# Patient Record
Sex: Male | Born: 1965 | Race: White | Hispanic: Yes | Marital: Single | Smoking: Never smoker
Health system: Southern US, Community
[De-identification: ages and names within clinical notes are randomized; demographics above are authoritative.]

## PROBLEM LIST (undated history)

## (undated) DIAGNOSIS — G40409 Other generalized epilepsy and epileptic syndromes, not intractable, without status epilepticus: Secondary | ICD-10-CM

## (undated) DIAGNOSIS — J189 Pneumonia, unspecified organism: Secondary | ICD-10-CM

## (undated) DIAGNOSIS — D649 Anemia, unspecified: Secondary | ICD-10-CM

## (undated) DIAGNOSIS — E039 Hypothyroidism, unspecified: Secondary | ICD-10-CM

## (undated) DIAGNOSIS — F419 Anxiety disorder, unspecified: Secondary | ICD-10-CM

## (undated) DIAGNOSIS — N186 End stage renal disease: Secondary | ICD-10-CM

## (undated) DIAGNOSIS — K5900 Constipation, unspecified: Secondary | ICD-10-CM

## (undated) DIAGNOSIS — K219 Gastro-esophageal reflux disease without esophagitis: Secondary | ICD-10-CM

## (undated) DIAGNOSIS — Z992 Dependence on renal dialysis: Secondary | ICD-10-CM

## (undated) DIAGNOSIS — Q059 Spina bifida, unspecified: Secondary | ICD-10-CM

## (undated) DIAGNOSIS — R0602 Shortness of breath: Secondary | ICD-10-CM

## (undated) DIAGNOSIS — I10 Essential (primary) hypertension: Secondary | ICD-10-CM

## (undated) DIAGNOSIS — I350 Nonrheumatic aortic (valve) stenosis: Secondary | ICD-10-CM

## (undated) DIAGNOSIS — Z9289 Personal history of other medical treatment: Secondary | ICD-10-CM

## (undated) DIAGNOSIS — T8189XA Other complications of procedures, not elsewhere classified, initial encounter: Secondary | ICD-10-CM

## (undated) DIAGNOSIS — G43909 Migraine, unspecified, not intractable, without status migrainosus: Secondary | ICD-10-CM

## (undated) DIAGNOSIS — G4700 Insomnia, unspecified: Secondary | ICD-10-CM

## (undated) DIAGNOSIS — R011 Cardiac murmur, unspecified: Secondary | ICD-10-CM

## (undated) DIAGNOSIS — Z87442 Personal history of urinary calculi: Secondary | ICD-10-CM

## (undated) HISTORY — PX: LITHOTRIPSY: SUR834

## (undated) HISTORY — PX: TONGUE SURGERY: SHX810

## (undated) HISTORY — PX: INSERTION OF DIALYSIS CATHETER: SHX1324

## (undated) HISTORY — PX: PATELLAR TENDON REPAIR: SHX737

## (undated) HISTORY — PX: APPENDECTOMY: SHX54

## (undated) HISTORY — PX: ILEOSTOMY: SHX1783

## (undated) HISTORY — PX: COLONOSCOPY: SHX174

---
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
## 2007-10-01 DIAGNOSIS — N189 Chronic kidney disease, unspecified: Secondary | ICD-10-CM | POA: Insufficient documentation

## 2007-10-01 DIAGNOSIS — I10 Essential (primary) hypertension: Secondary | ICD-10-CM | POA: Insufficient documentation

## 2007-10-01 DIAGNOSIS — D509 Iron deficiency anemia, unspecified: Secondary | ICD-10-CM | POA: Insufficient documentation

## 2007-10-01 DIAGNOSIS — D631 Anemia in chronic kidney disease: Secondary | ICD-10-CM | POA: Insufficient documentation

## 2007-10-01 DIAGNOSIS — N2581 Secondary hyperparathyroidism of renal origin: Secondary | ICD-10-CM | POA: Insufficient documentation

## 2007-10-02 DIAGNOSIS — Z888 Allergy status to other drugs, medicaments and biological substances status: Secondary | ICD-10-CM | POA: Insufficient documentation

## 2008-03-24 ENCOUNTER — Ambulatory Visit (HOSPITAL_COMMUNITY): Admission: RE | Admit: 2008-03-24 | Discharge: 2008-03-24 | Payer: Self-pay | Admitting: Nephrology

## 2008-04-26 ENCOUNTER — Emergency Department (HOSPITAL_COMMUNITY): Admission: EM | Admit: 2008-04-26 | Discharge: 2008-04-26 | Payer: Self-pay | Admitting: Emergency Medicine

## 2008-05-04 ENCOUNTER — Inpatient Hospital Stay (HOSPITAL_COMMUNITY): Admission: EM | Admit: 2008-05-04 | Discharge: 2008-05-06 | Payer: Self-pay | Admitting: Emergency Medicine

## 2008-05-05 ENCOUNTER — Encounter (INDEPENDENT_AMBULATORY_CARE_PROVIDER_SITE_OTHER): Payer: Self-pay | Admitting: Cardiology

## 2008-05-22 ENCOUNTER — Ambulatory Visit: Payer: Self-pay | Admitting: Internal Medicine

## 2008-05-22 ENCOUNTER — Inpatient Hospital Stay (HOSPITAL_COMMUNITY): Admission: EM | Admit: 2008-05-22 | Discharge: 2008-05-23 | Payer: Self-pay | Admitting: Emergency Medicine

## 2008-08-23 ENCOUNTER — Inpatient Hospital Stay (HOSPITAL_COMMUNITY): Admission: EM | Admit: 2008-08-23 | Discharge: 2008-08-25 | Payer: Self-pay | Admitting: Emergency Medicine

## 2008-08-23 ENCOUNTER — Ambulatory Visit: Payer: Self-pay | Admitting: Vascular Surgery

## 2008-09-11 ENCOUNTER — Ambulatory Visit: Payer: Self-pay | Admitting: Vascular Surgery

## 2008-09-30 ENCOUNTER — Ambulatory Visit: Payer: Self-pay | Admitting: Vascular Surgery

## 2008-10-08 ENCOUNTER — Ambulatory Visit: Payer: Self-pay | Admitting: Thoracic Diseases

## 2008-10-08 ENCOUNTER — Ambulatory Visit (HOSPITAL_COMMUNITY): Admission: RE | Admit: 2008-10-08 | Discharge: 2008-10-08 | Payer: Self-pay | Admitting: Vascular Surgery

## 2008-11-17 DIAGNOSIS — Z8614 Personal history of Methicillin resistant Staphylococcus aureus infection: Secondary | ICD-10-CM | POA: Insufficient documentation

## 2009-01-28 ENCOUNTER — Ambulatory Visit (HOSPITAL_COMMUNITY): Admission: RE | Admit: 2009-01-28 | Discharge: 2009-01-28 | Payer: Self-pay | Admitting: Nephrology

## 2009-08-25 ENCOUNTER — Emergency Department (HOSPITAL_COMMUNITY): Admission: EM | Admit: 2009-08-25 | Discharge: 2009-08-25 | Payer: Self-pay | Admitting: Emergency Medicine

## 2009-08-26 ENCOUNTER — Ambulatory Visit (HOSPITAL_COMMUNITY): Admission: RE | Admit: 2009-08-26 | Discharge: 2009-08-26 | Payer: Self-pay | Admitting: Nephrology

## 2009-09-22 ENCOUNTER — Ambulatory Visit (HOSPITAL_COMMUNITY): Admission: RE | Admit: 2009-09-22 | Discharge: 2009-09-22 | Payer: Self-pay | Admitting: Nephrology

## 2010-01-23 ENCOUNTER — Encounter: Payer: Self-pay | Admitting: Nephrology

## 2010-01-24 ENCOUNTER — Encounter: Payer: Self-pay | Admitting: Nephrology

## 2010-03-17 LAB — POTASSIUM: Potassium: 5.9 mEq/L — ABNORMAL HIGH (ref 3.5–5.1)

## 2010-03-18 LAB — POCT I-STAT, CHEM 8
BUN: 41 mg/dL — ABNORMAL HIGH (ref 6–23)
Calcium, Ion: 1.13 mmol/L (ref 1.12–1.32)
Chloride: 99 mEq/L (ref 96–112)
Creatinine, Ser: 9.2 mg/dL — ABNORMAL HIGH (ref 0.4–1.5)
Glucose, Bld: 83 mg/dL (ref 70–99)
HCT: 35 % — ABNORMAL LOW (ref 39.0–52.0)
Hemoglobin: 11.9 g/dL — ABNORMAL LOW (ref 13.0–17.0)
Potassium: 5 mEq/L (ref 3.5–5.1)
Sodium: 137 mEq/L (ref 135–145)
TCO2: 33 mmol/L (ref 0–100)

## 2010-03-18 LAB — DIFFERENTIAL
Basophils Absolute: 0 10*3/uL (ref 0.0–0.1)
Basophils Relative: 0 % (ref 0–1)
Eosinophils Absolute: 0.1 10*3/uL (ref 0.0–0.7)
Eosinophils Relative: 2 % (ref 0–5)
Lymphocytes Relative: 10 % — ABNORMAL LOW (ref 12–46)
Lymphs Abs: 0.6 10*3/uL — ABNORMAL LOW (ref 0.7–4.0)
Monocytes Absolute: 0.5 10*3/uL (ref 0.1–1.0)
Monocytes Relative: 9 % (ref 3–12)
Neutro Abs: 4.2 10*3/uL (ref 1.7–7.7)
Neutrophils Relative %: 79 % — ABNORMAL HIGH (ref 43–77)

## 2010-03-18 LAB — CBC
HCT: 32.7 % — ABNORMAL LOW (ref 39.0–52.0)
Hemoglobin: 10.3 g/dL — ABNORMAL LOW (ref 13.0–17.0)
MCH: 29 pg (ref 26.0–34.0)
MCHC: 31.5 g/dL (ref 30.0–36.0)
MCV: 92.1 fL (ref 78.0–100.0)
Platelets: 139 10*3/uL — ABNORMAL LOW (ref 150–400)
RBC: 3.55 MIL/uL — ABNORMAL LOW (ref 4.22–5.81)
RDW: 14.9 % (ref 11.5–15.5)
WBC: 5.4 10*3/uL (ref 4.0–10.5)

## 2010-03-18 LAB — POTASSIUM: Potassium: 4.9 mEq/L (ref 3.5–5.1)

## 2010-04-09 LAB — CBC
HCT: 33.4 % — ABNORMAL LOW (ref 39.0–52.0)
HCT: 37.1 % — ABNORMAL LOW (ref 39.0–52.0)
HCT: 37.8 % — ABNORMAL LOW (ref 39.0–52.0)
Hemoglobin: 11.4 g/dL — ABNORMAL LOW (ref 13.0–17.0)
Hemoglobin: 12.7 g/dL — ABNORMAL LOW (ref 13.0–17.0)
Hemoglobin: 12.8 g/dL — ABNORMAL LOW (ref 13.0–17.0)
MCHC: 33.7 g/dL (ref 30.0–36.0)
MCHC: 34.2 g/dL (ref 30.0–36.0)
MCHC: 34.5 g/dL (ref 30.0–36.0)
MCV: 92.5 fL (ref 78.0–100.0)
MCV: 93 fL (ref 78.0–100.0)
MCV: 93.2 fL (ref 78.0–100.0)
Platelets: 140 10*3/uL — ABNORMAL LOW (ref 150–400)
Platelets: 160 10*3/uL (ref 150–400)
Platelets: DECREASED 10*3/uL (ref 150–400)
RBC: 3.59 MIL/uL — ABNORMAL LOW (ref 4.22–5.81)
RBC: 4.01 MIL/uL — ABNORMAL LOW (ref 4.22–5.81)
RBC: 4.06 MIL/uL — ABNORMAL LOW (ref 4.22–5.81)
RDW: 17.8 % — ABNORMAL HIGH (ref 11.5–15.5)
RDW: 17.8 % — ABNORMAL HIGH (ref 11.5–15.5)
RDW: 18.3 % — ABNORMAL HIGH (ref 11.5–15.5)
WBC: 8.1 10*3/uL (ref 4.0–10.5)
WBC: 9.1 10*3/uL (ref 4.0–10.5)
WBC: 9.6 10*3/uL (ref 4.0–10.5)

## 2010-04-09 LAB — BASIC METABOLIC PANEL
BUN: 47 mg/dL — ABNORMAL HIGH (ref 6–23)
CO2: 28 mEq/L (ref 19–32)
Calcium: 8.2 mg/dL — ABNORMAL LOW (ref 8.4–10.5)
Chloride: 95 mEq/L — ABNORMAL LOW (ref 96–112)
Creatinine, Ser: 9.22 mg/dL — ABNORMAL HIGH (ref 0.4–1.5)
GFR calc Af Amer: 8 mL/min — ABNORMAL LOW (ref >60)
GFR calc non Af Amer: 6 mL/min — ABNORMAL LOW (ref >60)
Glucose, Bld: 135 mg/dL — ABNORMAL HIGH (ref 70–99)
Potassium: 4.5 mEq/L (ref 3.5–5.1)
Sodium: 136 mEq/L (ref 135–145)

## 2010-04-09 LAB — POCT CARDIAC MARKERS
CKMB, poc: <1 ng/mL — ABNORMAL LOW (ref 1.0–8.0)
Myoglobin, poc: 381 ng/mL (ref 12–200)
Troponin i, poc: <0.05 ng/mL (ref 0.00–0.09)

## 2010-04-09 LAB — COMPREHENSIVE METABOLIC PANEL
ALT: 13 U/L (ref 0–53)
AST: 10 U/L (ref 0–37)
Albumin: 3.5 g/dL (ref 3.5–5.2)
Alkaline Phosphatase: 77 U/L (ref 39–117)
BUN: 55 mg/dL — ABNORMAL HIGH (ref 6–23)
CO2: 25 mEq/L (ref 19–32)
Calcium: 8.5 mg/dL (ref 8.4–10.5)
Chloride: 95 mEq/L — ABNORMAL LOW (ref 96–112)
Creatinine, Ser: 9.44 mg/dL — ABNORMAL HIGH (ref 0.4–1.5)
GFR calc Af Amer: 7 mL/min — ABNORMAL LOW (ref >60)
GFR calc non Af Amer: 6 mL/min — ABNORMAL LOW (ref >60)
Glucose, Bld: 112 mg/dL — ABNORMAL HIGH (ref 70–99)
Potassium: 4.5 mEq/L (ref 3.5–5.1)
Sodium: 136 mEq/L (ref 135–145)
Total Bilirubin: 0.7 mg/dL (ref 0.3–1.2)
Total Protein: 9.2 g/dL — ABNORMAL HIGH (ref 6.0–8.3)

## 2010-04-09 LAB — POCT I-STAT 4, (NA,K, GLUC, HGB,HCT)
Glucose, Bld: 90 mg/dL (ref 70–99)
HCT: 45 % (ref 39.0–52.0)
Hemoglobin: 15.3 g/dL (ref 13.0–17.0)
Potassium: 4.8 mEq/L (ref 3.5–5.1)
Sodium: 136 mEq/L (ref 135–145)

## 2010-04-09 LAB — DIFFERENTIAL
Basophils Absolute: 0 10*3/uL (ref 0.0–0.1)
Basophils Relative: 0 % (ref 0–1)
Eosinophils Absolute: 0.2 10*3/uL (ref 0.0–0.7)
Eosinophils Relative: 2 % (ref 0–5)
Lymphocytes Relative: 7 % — ABNORMAL LOW (ref 12–46)
Lymphs Abs: 0.6 10*3/uL — ABNORMAL LOW (ref 0.7–4.0)
Monocytes Absolute: 0.6 10*3/uL (ref 0.1–1.0)
Monocytes Relative: 7 % (ref 3–12)
Neutro Abs: 7.7 10*3/uL (ref 1.7–7.7)
Neutrophils Relative %: 85 % — ABNORMAL HIGH (ref 43–77)

## 2010-04-09 LAB — POTASSIUM: Potassium: 4.3 mEq/L (ref 3.5–5.1)

## 2010-04-09 LAB — PROTIME-INR
INR: 1.1 (ref 0.00–1.49)
Prothrombin Time: 13.9 seconds (ref 11.6–15.2)

## 2010-04-12 LAB — CBC
HCT: 36.3 % — ABNORMAL LOW (ref 39.0–52.0)
HCT: 38.1 % — ABNORMAL LOW (ref 39.0–52.0)
HCT: 38.6 % — ABNORMAL LOW (ref 39.0–52.0)
Hemoglobin: 12.3 g/dL — ABNORMAL LOW (ref 13.0–17.0)
Hemoglobin: 12.8 g/dL — ABNORMAL LOW (ref 13.0–17.0)
Hemoglobin: 12.9 g/dL — ABNORMAL LOW (ref 13.0–17.0)
MCHC: 33.9 g/dL (ref 30.0–36.0)
MCHC: 33.5 g/dL (ref 30.0–36.0)
MCHC: 33.5 g/dL (ref 30.0–36.0)
MCV: 93.2 fL (ref 78.0–100.0)
MCV: 93.2 fL (ref 78.0–100.0)
MCV: 93.9 fL (ref 78.0–100.0)
Platelets: 100 10*3/uL — ABNORMAL LOW (ref 150–400)
Platelets: 106 10*3/uL — ABNORMAL LOW (ref 150–400)
Platelets: 112 10*3/uL — ABNORMAL LOW (ref 150–400)
RBC: 3.89 MIL/uL — ABNORMAL LOW (ref 4.22–5.81)
RBC: 4.05 MIL/uL — ABNORMAL LOW (ref 4.22–5.81)
RBC: 4.14 MIL/uL — ABNORMAL LOW (ref 4.22–5.81)
RDW: 18.3 % — ABNORMAL HIGH (ref 11.5–15.5)
RDW: 17.8 % — ABNORMAL HIGH (ref 11.5–15.5)
RDW: 18.8 % — ABNORMAL HIGH (ref 11.5–15.5)
WBC: 5.5 10*3/uL (ref 4.0–10.5)
WBC: 6.2 10*3/uL (ref 4.0–10.5)
WBC: 7.6 10*3/uL (ref 4.0–10.5)

## 2010-04-12 LAB — DIFFERENTIAL
Basophils Absolute: 0 10*3/uL (ref 0.0–0.1)
Basophils Absolute: 0 10*3/uL (ref 0.0–0.1)
Basophils Relative: 0 % (ref 0–1)
Basophils Relative: 0 % (ref 0–1)
Eosinophils Absolute: 0.3 10*3/uL (ref 0.0–0.7)
Eosinophils Absolute: 0.2 10*3/uL (ref 0.0–0.7)
Eosinophils Relative: 5 % (ref 0–5)
Eosinophils Relative: 2 % (ref 0–5)
Lymphocytes Relative: 14 % (ref 12–46)
Lymphocytes Relative: 6 % — ABNORMAL LOW (ref 12–46)
Lymphs Abs: 0.7 10*3/uL (ref 0.7–4.0)
Lymphs Abs: 0.4 10*3/uL — ABNORMAL LOW (ref 0.7–4.0)
Monocytes Absolute: 0.5 10*3/uL (ref 0.1–1.0)
Monocytes Absolute: 0.5 10*3/uL (ref 0.1–1.0)
Monocytes Relative: 10 % (ref 3–12)
Monocytes Relative: 6 % (ref 3–12)
Neutro Abs: 4 10*3/uL (ref 1.7–7.7)
Neutro Abs: 6.5 10*3/uL (ref 1.7–7.7)
Neutrophils Relative %: 72 % (ref 43–77)
Neutrophils Relative %: 86 % — ABNORMAL HIGH (ref 43–77)

## 2010-04-12 LAB — COMPREHENSIVE METABOLIC PANEL
ALT: 10 U/L (ref 0–53)
ALT: 9 U/L (ref 0–53)
AST: 17 U/L (ref 0–37)
AST: 14 U/L (ref 0–37)
Albumin: 3.3 g/dL — ABNORMAL LOW (ref 3.5–5.2)
Albumin: 3.6 g/dL (ref 3.5–5.2)
Alkaline Phosphatase: 57 U/L (ref 39–117)
Alkaline Phosphatase: 59 U/L (ref 39–117)
BUN: 36 mg/dL — ABNORMAL HIGH (ref 6–23)
BUN: 54 mg/dL — ABNORMAL HIGH (ref 6–23)
CO2: 29 mEq/L (ref 19–32)
CO2: 24 mEq/L (ref 19–32)
Calcium: 8.7 mg/dL (ref 8.4–10.5)
Calcium: 8.5 mg/dL (ref 8.4–10.5)
Chloride: 94 mEq/L — ABNORMAL LOW (ref 96–112)
Chloride: 98 mEq/L (ref 96–112)
Creatinine, Ser: 8.87 mg/dL — ABNORMAL HIGH (ref 0.4–1.5)
Creatinine, Ser: 10.82 mg/dL — ABNORMAL HIGH (ref 0.4–1.5)
GFR calc Af Amer: 8 mL/min — ABNORMAL LOW (ref >60)
GFR calc Af Amer: 6 mL/min — ABNORMAL LOW (ref >60)
GFR calc non Af Amer: 7 mL/min — ABNORMAL LOW (ref >60)
GFR calc non Af Amer: 5 mL/min — ABNORMAL LOW (ref >60)
Glucose, Bld: 96 mg/dL (ref 70–99)
Glucose, Bld: 106 mg/dL — ABNORMAL HIGH (ref 70–99)
Potassium: 4.7 mEq/L (ref 3.5–5.1)
Potassium: 4.2 mEq/L (ref 3.5–5.1)
Sodium: 138 mEq/L (ref 135–145)
Sodium: 138 mEq/L (ref 135–145)
Total Bilirubin: 0.8 mg/dL (ref 0.3–1.2)
Total Bilirubin: 0.6 mg/dL (ref 0.3–1.2)
Total Protein: 7.8 g/dL (ref 6.0–8.3)
Total Protein: 8.7 g/dL — ABNORMAL HIGH (ref 6.0–8.3)

## 2010-04-12 LAB — CULTURE, BLOOD (ROUTINE X 2)
Culture: NO GROWTH
Culture: NO GROWTH
Culture: NO GROWTH

## 2010-04-12 LAB — BASIC METABOLIC PANEL
BUN: 31 mg/dL — ABNORMAL HIGH (ref 6–23)
CO2: 31 mEq/L (ref 19–32)
Calcium: 8.7 mg/dL (ref 8.4–10.5)
Chloride: 94 mEq/L — ABNORMAL LOW (ref 96–112)
Creatinine, Ser: 8.17 mg/dL — ABNORMAL HIGH (ref 0.4–1.5)
GFR calc Af Amer: 9 mL/min — ABNORMAL LOW (ref >60)
GFR calc non Af Amer: 7 mL/min — ABNORMAL LOW (ref >60)
Glucose, Bld: 125 mg/dL — ABNORMAL HIGH (ref 70–99)
Potassium: 4.3 mEq/L (ref 3.5–5.1)
Sodium: 140 mEq/L (ref 135–145)

## 2010-04-12 LAB — TSH: TSH: 17.998 u[IU]/mL — ABNORMAL HIGH (ref 0.350–4.500)

## 2010-04-12 LAB — CARDIAC PANEL(CRET KIN+CKTOT+MB+TROPI)
CK, MB: 1 ng/mL (ref 0.3–4.0)
CK, MB: 1.1 ng/mL (ref 0.3–4.0)
CK, MB: 1.3 ng/mL (ref 0.3–4.0)
CK, MB: 1.6 ng/mL (ref 0.3–4.0)
Relative Index: INVALID (ref 0.0–2.5)
Relative Index: INVALID (ref 0.0–2.5)
Relative Index: INVALID (ref 0.0–2.5)
Relative Index: INVALID (ref 0.0–2.5)
Total CK: 19 U/L (ref 7–232)
Total CK: 22 U/L (ref 7–232)
Total CK: 34 U/L (ref 7–232)
Total CK: 41 U/L (ref 7–232)
Troponin I: 0.01 ng/mL (ref 0.00–0.06)
Troponin I: 0.01 ng/mL (ref 0.00–0.06)
Troponin I: 0.01 ng/mL (ref 0.00–0.06)
Troponin I: 0.01 ng/mL (ref 0.00–0.06)

## 2010-04-12 LAB — VANCOMYCIN, RANDOM: Vancomycin Rm: 6.3 ug/mL

## 2010-04-12 LAB — TROPONIN I
Troponin I: <0.01 ng/mL (ref 0.00–0.06)
Troponin I: 0.02 ng/mL (ref 0.00–0.06)

## 2010-04-12 LAB — BRAIN NATRIURETIC PEPTIDE: Pro B Natriuretic peptide (BNP): 107 pg/mL — ABNORMAL HIGH (ref 0.0–100.0)

## 2010-04-12 LAB — RENAL FUNCTION PANEL
Albumin: 3.2 g/dL — ABNORMAL LOW (ref 3.5–5.2)
BUN: 42 mg/dL — ABNORMAL HIGH (ref 6–23)
CO2: 27 mEq/L (ref 19–32)
Calcium: 8.4 mg/dL (ref 8.4–10.5)
Chloride: 96 mEq/L (ref 96–112)
Creatinine, Ser: 9.41 mg/dL — ABNORMAL HIGH (ref 0.4–1.5)
GFR calc Af Amer: 7 mL/min — ABNORMAL LOW (ref >60)
GFR calc non Af Amer: 6 mL/min — ABNORMAL LOW (ref >60)
Glucose, Bld: 96 mg/dL (ref 70–99)
Phosphorus: 6.4 mg/dL — ABNORMAL HIGH (ref 2.3–4.6)
Potassium: 4.7 mEq/L (ref 3.5–5.1)
Sodium: 136 mEq/L (ref 135–145)

## 2010-04-12 LAB — CK TOTAL AND CKMB (NOT AT ARMC)
CK, MB: 1.1 ng/mL (ref 0.3–4.0)
CK, MB: 1.3 ng/mL (ref 0.3–4.0)
Relative Index: INVALID (ref 0.0–2.5)
Relative Index: INVALID (ref 0.0–2.5)
Total CK: 35 U/L (ref 7–232)
Total CK: 31 U/L (ref 7–232)

## 2010-04-12 LAB — POCT CARDIAC MARKERS
CKMB, poc: 1 ng/mL (ref 1.0–8.0)
Myoglobin, poc: 385 ng/mL (ref 12–200)
Troponin i, poc: <0.05 ng/mL (ref 0.00–0.09)

## 2010-04-12 LAB — D-DIMER, QUANTITATIVE: D-Dimer, Quant: 0.42 ug/mL-FEU (ref 0.00–0.48)

## 2010-04-12 LAB — GLUCOSE, CAPILLARY: Glucose-Capillary: 87 mg/dL (ref 70–99)

## 2010-04-12 LAB — LACTIC ACID, PLASMA: Lactic Acid, Venous: 1.8 mmol/L (ref 0.5–2.2)

## 2010-04-12 LAB — CORTISOL: Cortisol, Plasma: 5.6 ug/dL

## 2010-04-12 LAB — LIPASE, BLOOD
Lipase: 21 U/L (ref 11–59)
Lipase: 22 U/L (ref 11–59)

## 2010-04-12 LAB — MAGNESIUM: Magnesium: 2.4 mg/dL (ref 1.5–2.5)

## 2010-04-12 LAB — T4, FREE: Free T4: 0.92 ng/dL (ref 0.80–1.80)

## 2010-04-13 LAB — COMPREHENSIVE METABOLIC PANEL
ALT: 20 U/L (ref 0–53)
AST: 11 U/L (ref 0–37)
Albumin: 3.9 g/dL (ref 3.5–5.2)
Alkaline Phosphatase: 66 U/L (ref 39–117)
BUN: 51 mg/dL — ABNORMAL HIGH (ref 6–23)
CO2: 28 mEq/L (ref 19–32)
Calcium: 9.4 mg/dL (ref 8.4–10.5)
Chloride: 92 mEq/L — ABNORMAL LOW (ref 96–112)
Creatinine, Ser: 9.1 mg/dL — ABNORMAL HIGH (ref 0.4–1.5)
GFR calc Af Amer: 8 mL/min — ABNORMAL LOW (ref >60)
GFR calc non Af Amer: 6 mL/min — ABNORMAL LOW (ref >60)
Glucose, Bld: 87 mg/dL (ref 70–99)
Potassium: 4.7 mEq/L (ref 3.5–5.1)
Sodium: 136 mEq/L (ref 135–145)
Total Bilirubin: 0.7 mg/dL (ref 0.3–1.2)
Total Protein: 9.2 g/dL — ABNORMAL HIGH (ref 6.0–8.3)

## 2010-04-13 LAB — CULTURE, BLOOD (ROUTINE X 2)
Culture: NO GROWTH
Culture: NO GROWTH

## 2010-04-13 LAB — DIFFERENTIAL
Basophils Absolute: 0 10*3/uL (ref 0.0–0.1)
Basophils Relative: 0 % (ref 0–1)
Eosinophils Absolute: 0.3 10*3/uL (ref 0.0–0.7)
Eosinophils Relative: 4 % (ref 0–5)
Lymphocytes Relative: 12 % (ref 12–46)
Lymphs Abs: 1 10*3/uL (ref 0.7–4.0)
Monocytes Absolute: 0.6 10*3/uL (ref 0.1–1.0)
Monocytes Relative: 7 % (ref 3–12)
Neutro Abs: 6.8 10*3/uL (ref 1.7–7.7)
Neutrophils Relative %: 77 % (ref 43–77)

## 2010-04-13 LAB — WOUND CULTURE

## 2010-04-13 LAB — CBC
HCT: 40 % (ref 39.0–52.0)
Hemoglobin: 13.4 g/dL (ref 13.0–17.0)
MCHC: 33.6 g/dL (ref 30.0–36.0)
MCV: 93.1 fL (ref 78.0–100.0)
Platelets: 117 10*3/uL — ABNORMAL LOW (ref 150–400)
RBC: 4.3 MIL/uL (ref 4.22–5.81)
RDW: 18.3 % — ABNORMAL HIGH (ref 11.5–15.5)
WBC: 8.8 10*3/uL (ref 4.0–10.5)

## 2010-05-17 NOTE — Discharge Summary (Signed)
[name], [name]            ACCOUNT NO.:  [id number]   MEDICAL RECORD NO.:  [id number]          PATIENT TYPE:  INP   LOCATION:  6740                         FACILITY:  [facility]   PHYSICIAN:  Helen Hashimoto, MD    DATE OF BIRTH:  [birth year]   DATE OF ADMISSION:  05/03/2008  DATE OF DISCHARGE:  05/05/2008                               DISCHARGE SUMMARY   DISCHARGE DIAGNOSES:  1. Noncardiac chest pain most likely related to musculoskeletal      causes.  2. History of end-stage renal disease and the patient is on      hemodialysis since 1997.  3. History of spina bifida with previous ileal conduit which is not      functioning.  4. History of nephrostomy tube infection for which the patient has      been receiving antibiotics.  5. History of methicillin-resistant Staphylococcus aureus and the      patient is still on vancomycin and Fortaz.  6. Obesity.   DISCHARGE MEDICATIONS:  The patient will continue in same medications as  preadmission that include;  1. PhosLo 2668 mg 3 times daily.  2. Trazodone 50 mg once a day.  3. Atenolol 50 mg twice daily.   MEDICATIONS TO STOP:  The patient has taken those medications in as-  needed basis and he will continue to take them as needed, but nothing as  regular and those include Ambien, Phenergan, clonidine, and Lasix.   NEW MEDICATION:  Percocet 5/325 mg 1-2 tablets p.o. q.4 h. as needed.   CONSULTATIONS:  1. Urology consult by Dr. Janice Norrie.  2. Renal consult.   PROCEDURES:  None.   DIAGNOSTIC STUDIES:  Chest x-ray in May 03, 2008 showed no acute problem.   FOLLOW UP:  1. Primary doctor within 1-2 weeks.  2. The patient is to continue hemodialysis as scheduled.   COURSE OF HOSPITALIZATION:  1. Chest pain.  This is noncardiac in nature and it is mainly in the      lower part of his chest and it is most likely secondary to      musculoskeletal causes or the patient had tenderness in his exam.      The patient was monitored in  telemetry.  Three sets of troponin and      cardiac enzymes were done and they came to be negative.  His tele      monitor showed no evidence of acute problems.  Overall, the patient      does not have much risk factors and his pain totally felt      noncardiac and no further workup is recommended at this point.  2. History of methicillin-resistant Staphylococcus aureus abscess in      old left nephrostomy tube.  This patient has been treated with IV      antibiotics.  He came to the hospital on April 27, 2008 and at that      time, CT scan of the abdomen and pelvis was done and it showed that      two focal low-density collections along the lower pole of the left  kidney, which decreased in size compared to his previous CAT scan.      Urology evaluation was done during this hospitalization and Urology      think that fluid collections has been stable and actually they are      decreased in size meaning that he is improving with antibiotics.      No further intervention is recommended at this point.  They      recommend to continue his current IV antibiotics that include      Fortaz and vancomycin.  At the matter of fact, the patient remains      afebrile during this hospitalization and his white count has been      stable.  He continued to have some flank pain that has been treated      with Percocet and he received prescription with 20 tablets of      Percocet at the time of discharge.  3. End-stage renal disease.  Nephrology was consulted during this      hospitalization and his routine hemodialysis was continued.   Otherwise other medical conditions remained stable during this  hospitalization.  The patient would be discharged home today in a stable  condition and no further chest pain or shortness of breath.  He will  follow with his primary doctor and with his nephrologist.   TOTAL ASSESSMENT TIME:  Forty minutes.      Helen Hashimoto, MD  Electronically Signed      NAE/MEDQ  D:  05/05/2008  T:  05/06/2008  Job:  [id number]

## 2010-05-17 NOTE — H&P (Signed)
NAMEADDIEL, GLEISSNER            ACCOUNT NO.:  [id number]   MEDICAL RECORD NO.:  [id number]          PATIENT TYPE:  INP   LOCATION:  6742                         FACILITY:  [facility]   PHYSICIAN:  Rosetta Posner, M.D.    DATE OF BIRTH:  June 23, 1965   DATE OF ADMISSION:  08/23/2008  DATE OF DISCHARGE:                              HISTORY & PHYSICAL   CHIEF COMPLAINT:  Bleeding from left thigh arteriovenous graft.   HISTORY OF PRESENT ILLNESS:  Mr. Jentsch is a 45 year old Caucasian  male with history of end-stage renal disease.  He is status post left  thigh arteriovenous Gore-Tex graft placed in [place] by Dr. Towana Badger in  March 2006.  It has not required any revisions at this time.  He has  since moved to [facility] and has dialysis on Monday, Wednesday, Friday  at [facility].  Today while at home, he developed bleeding from his left  thigh graft.  He applied pressure and called EMS who brought him to  [facility] Emergency Department.  The emergency room physician  applied hemostatic agent and this temporally achieved hemostasis.  However, the dressing was adjusted and bleeding recurred.  Again  pressure was held and Vascular Surgery consultation was requested.  He  was seen by Dr. Sherren Mocha Early who placed multiple sutures along the hole in  the skin, which was felt to track down to the graft secondary to  degeneration.   PAST MEDICAL HISTORY:  1. End-stage renal disease on hemodialysis, Monday, Wednesday, Friday      at [facility].  2. Hypothyroidism.  3. Hypertension but has now required medical treatment since starting      dialysis.  4. Obesity.  5. History of night terrors.  6. History of bilateral nephrostomy tubes for which he developed MRSA.  7. History of clubfeet.  8. History of urostomy with bag.  9. 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   MEDICATIONS:  1. Ambien 10 mg p.o. at  bedtime.  2. Trazodone 50 mg p.o. at bedtime.  3. Flonase 1 puff in each nostril daily.  4. Enteric-coated aspirin 325 mg p.o. daily.  5. Allegra 60 mg p.o. b.i.d. p.r.n. allergies.  6. PhosLo 4 tablets p.o. t.i.d. with meals, 2 tablets p.o. with      snacks.  7. Oystercal 500 mg p.o. b.i.d.  8. Nephro-Vite 1 tablet p.o. daily.  9. Synthroid 5 mcg p.o. daily.  10.Omeprazole 20 mg p.o. daily.   FAMILY HISTORY:  Noncontributory.   SOCIAL HISTORY:  Denies alcohol, drug, or smoking history.   ALLERGIES:  SULFA, FURADANTIN, MANDELAMINE.   REVIEW OF SYSTEMS:  He denies fever, chest pain.  He does report  occasional dyspnea on exertion.  He denies hematochezia.  No lower  extremity numbness or tingling.   PHYSICAL EXAMINATION:  VITAL SIGNS:  Temperature 97.9, pulse 86,  respirations 16, blood pressure 144/77, oxygen saturation 100% on room  air.  GENERAL:  Obese, alert, cooperative, in no acute distress.  CHEST:  Lung sounds are  clear throughout.  HEART:  Regular rate and rhythm.  No murmur, rub, or gallop was noted.  ABDOMEN:  Obese, soft, nontender.  He does have evidence of urostomy bag  in right lower quadrant.  There is some dark discolored urine, which he  says is the norm for him.  EXTREMITIES:  1+ lower extremity edema, generalized edema left greater  than the right.  2+ radial pulse on the right, nonpalpable radial pulse  on the left.  His hand is warm.  He has evidence of a left thigh  arteriovenous Gore-Tex graft.  There was a small punctate area in the  central graft that was opened and was bleeding, several 2-0 Nylon  stitches were placed by Dr. Sherren Mocha Early and bleeding is now controlled.  NEUROLOGIC:  She is alert and oriented x 3.  Moves all extremities.  Grips were strong and symmetrical.   LABORATORY DATA:  White count 9.1, hemoglobin 12.7, hematocrit 37.8,  platelet count 160.  Sodium 136, potassium 4.5, BUN 55, creatinine 9.44,  blood glucose 112, INR 1.1.  LFTs  within normal limits.  Troponin was  negative.   IMPRESSION:  End-stage renal disease with bleeding from left thigh  arteriovenous Gore-Tex graft secondary to degenerative graft, now  controlled with sutures.   PLAN:  He will be admitted to the renal floor for monitoring.  We will  plan revision of his left thigh arteriovenous Gore-Tex graft on August 24, 2008, by Dr. Curt Jews.  If we are able to provide, we will  consider new left thigh graft and placing a palindrome catheter.  The  patient is in agreement with this plan.  We have also notified Renal who  will see him postoperatively to determine timing of his next dialysis.      Jacinta Shoe, P.A.      Rosetta Posner, M.D.  Electronically Signed    AWZ/MEDQ  D:  08/23/2008  T:  08/24/2008  Job:  [id number]   cc:   [facility]

## 2010-05-17 NOTE — Consult Note (Signed)
[name], [name]            ACCOUNT NO.:  [id number]   MEDICAL RECORD NO.:  [id number]          PATIENT TYPE:  INP   LOCATION:  [id number]                         FACILITY:  [facility]   PHYSICIAN:  Hanley Ben, M.D.  DATE OF BIRTH:  10-Jun-1965   DATE OF CONSULTATION:  05/04/2008  DATE OF DISCHARGE:                                 CONSULTATION   REASON FOR CONSULTATION:  Left renal abscess.   The patient is a 45 year old male with a history of possible MRSA  abscess in his left kidney.  He had a CT scan in April 25 that showed  some fluid collection in the lower pole of the left kidney.  Those fluid  collections have decreased in size since his previous CT scan of 2008-04-19.  He has been draining some purulent material from the left flank.  Since April 19, 1984, he had had several nephrostomies for history of kidney  stone.  He has end-stage renal disease and had an ileal loop in [place] and  he was followed by his urologist there until September last year when he  moved to [place].  He has a history of myelomeningocele.  He has  minimal urinary output from the ileal loop.  He has been complaining of  left flank pain on and off.  He has been on vancomycin and gentamicin  and a culture of the drainage grew MRSA and I was asked to see the  patient for further treatment of the renal abscess.   PAST MEDICAL HISTORY:  Positive for:  1. End-stage renal disease.  2. Myelomeningocele.  3. Hypertension.  4. Nephrolithiasis.   ALLERGIES:  He is allergic to IV CONTRAST and IV DYE, NITROFURANTOIN,  SULFA, and MANDELAMINE.   FAMILY HISTORY:  His father died of an MI in 2005-04-19, his mother died of  respiratory failure in 04-19-01, and there is a family history of diabetes.   SOCIAL HISTORY:  He is single, does not have any children.  Does not  smoke nor drink.   MEDICATIONS:  He is on Percocet.   REVIEW OF SYSTEMS:  As noted in the HPI and everything else is negative.   PHYSICAL EXAMINATION:   GENERAL:  This is a well-developed, 45 year old  of male who is complaining of left flank pain.  VITAL SIGNS:  Blood pressure is 120/70, pulse 80, respirations 14, and  temperature 98.  NEUROLOGIC:  He is alert and oriented to time, place, and person.  HEENT:  His head is normal.  He has pink conjunctivae.  Ears, nose, and  throat are within normal limits.  NECK:  Supple.  No cervical adenopathy.  No thyromegaly.  CHEST:  Symmetrical.  LUNGS:  Fully expanded and clear to percussion and auscultation.  HEART:  Regular rhythm.  ABDOMEN:  Soft.  He has well-healed surgical scars.  He has an ileostomy  that is not draining any urine at this time.  He is on dialysis and has  end-stage renal disease.  He has a sinus track in the left flank that is  draining minimal amount of purulent material at this time.  His kidneys  are not palpable.  He has no hepatomegaly, no splenomegaly.  He still  has a bladder, but the bladder is not distended.  He has no inguinal  adenopathy.  GENITOURINARY:  Penis and scrotal contents are within normal limits.  There is no testicular mass.  SKIN:  Warm and dry.   I have reviewed the CT scan with Dr. Leonia Reeves and the impression is that  the fluid collection has decreased in size and are too small for  percutaneous drainage.   IMPRESSION:  End-stage renal disease, myelomeningocele, improving left  renal abscess.   SUGGESTION:  Since the fluid collection is decreasing in size, I do not  think that he needs percutaneous drainage at this time.  I would suggest  that we continue IV antibiotics.  I do not see any indication for a  nephrectomy at this time.      Hanley Ben, M.D.  Electronically Signed     MN/MEDQ  D:  05/04/2008  T:  05/05/2008  Job:  [id number]

## 2010-05-17 NOTE — Discharge Summary (Signed)
NAMEJAYMESON, Keith NO.:  [id number]   MEDICAL RECORD NO.:  [id number]          PATIENT TYPE:  INP   LOCATION:  6742                         FACILITY:  [facility]   PHYSICIAN:  Evette Doffing, M.D.  DATE OF BIRTH:  08-Nov-1965   DATE OF ADMISSION:  05/22/2008  DATE OF DISCHARGE:  05/23/2008                               DISCHARGE SUMMARY   ATTENDING PHYSICIAN:  Grayland Jack Phifer, MD   DISCHARGE DIAGNOSES:  1. Weakness likely secondary to hypothyroidism and volume overload      after inadequate hemodialysis.  2. Hypothyroidism.  3. Subjective fevers, unclear etiology.  4. End-stage renal disease on hemodialysis 3 times per week.  5. History of methicillin-resistant Staphylococcus aureus abscess.  6. Obesity with BMI with 30.  7. Hypertension.  8. History of nephrostomy tube on both kidneys.   DISCHARGE MEDICATIONS:  With accurate doses:  1. Ambien 10 mg at nighttime.  2. Trazodone 50 mg once every night.  3. Flonase one puff in both nostrils daily.  4. Aspirin 325 mg once daily.  5. Allegra 60 mg twice a day as needed for allergy.  6. Ceftazidime 2 g IV with hemodialysis.  7. Vancomycin 1 g IV with hemodialysis.  Stop date for vancomycin and      ceftazidime is May 28, 2008.  8. PhosLo 4 tablets p.o. 3 times a day, 2 tablets orally with snacks.  9. Os-Cal 500 mg twice daily.  10.Nephro-Vite 1 tablet once daily.  11.Synthroid 50 mcg once daily.  12.Omeprazole 20 mg daily.  13.Lorazepam 0.5 mg once at night as needed.   DISPOSITION:  To his primary care Jenasis Straley as needed.   CONSULTATION:  Renal Services.   PROCEDURES PERFORMED:  Chest x-ray for pneumonia on May 22, 2008, did  not show acute cardiopulmonary disease except bibasilar atelectasis and  low lung volumes.  CT abdomen and pelvis with contrast.  No acute  finding or interval change in the appearance of pelvis.  CT abdomen  without any acute findings, finding compatible with central venous  obstruction remains unchanged.  Renal atrophy with cystic change in  calcification in this chronic dialysis patient.  Unchanged atrophy of  the left psoas muscle.   HISTORY OF PRESENT ILLNESS:  The patient is a 45 year old white male  with past medical history of spina bifida resulting into neurogenic  bladder that eventually led to multiple ureteric stone, nephrostomy  tubes bilaterally on periodic basis, eventual renal failure and that  requires hemodialysis 3 times per week since 1997.  The patient had  recently developed MRSA abscess at the previous left nephrostomy site in  April 2010, that was treated with vancomycin and Fortaz with  hemodialysis.  He also was recently admitted for chest pain that was  ruled out for myocardial infarction and discharged on May 05, 2008.  The  patient started feeling somewhat sick 2 days after the discharge.  The  patient felt acutely sick, 1 day prior to admission.  The patient  reports weakness, nausea, and dry heaving and fever with abdominal pain.  The patient's weakness has progressed since yesterday.  The patient's  nausea and vomiting has been present now for 24 hours.  The patient  reports about 2 vomits with bowel contents but no blood.  The patient  reports fevers that are subjective and never measured at home.  The  patient reports chills associated with fevers.  The patient does not  report any cough, any chest pain, loss of consciousness, change in  medications.  The patient reports that his last hemodialysis due to low  blood pressure, very minimal amount of fluid was taken.  The patient  does not report any increased discharge at the previous MRSA site.   PHYSICAL EXAMINATION:  VITAL SIGNS:  The patient's vital on admission;  temperature 96.7, blood pressure 94/33, pulse of 62, respiratory rate  20, O2 saturation 99% on room air.  GENERAL:  The patient is a morbidly obese man who appears a little pale.  EYES:  Anicteric, PERRLA,  EOMI.  ENT:  Dry oral mucosa, halitosis.  NECK:  Supple, obese, full range of motion.  RESPIRATORY:  Clear to auscultation bilaterally.  No wheezing.  No  rales.  No rhonchi.  CARDIOVASCULAR:  Regular rate and rhythm.  Distant heart sounds.  ABDOMEN:  Soft and nondistended.  No guarding.  The patient has minimal  epigastric tenderness.  BACK:  No CVA tenderness.  The patient has 3 black color scar on left  posterior flank, which is at the site of previous MRSA abscess.  No  exudate.  No erythema surrounding the region.  There is no confluence  felt. The right nephrostomy sites have healed with the scar tissue on  the right lower quadrant.  Pouch is present for his ileal neobladder,  which has minimal amount of urine.  Stoma appears pink without any  scarring or signs of infection.  EXTREMITIES:  The patient has +3 edema on both legs, left side is more  prominent than right.  The patient has stasis dermatitis from chronic  venous stasis.  SKIN:  Dry.  NEUROLOGIC:  The patient is alert and oriented x3.  Nerves II through  XII intact.  No focal, motor, or sensory deficits.  Cerebellar signs  negative.  Gait was not examined.  PSYCH:  Appropriate.   LABORATORY ON ADMISSION:  Sodium 138, potassium 4.7, chloride 94, bicarb  29, BUN 36, creatinine 8.87, glucose of 96, anion gap of 15, bilirubin  0.8, alk phosphatase is 57, AST 12, ALT 10, protein 7.8, albumin 3.3,  calcium of 8.7, hemoglobin 12.3, white count of 5.5, platelet of 100 and  MCV of 93.2, ANC of 4.0.   HOSPITAL COURSE BY PROBLEM:  1. Generalized weakness.  The patient had generalized weakness likely      secondary to inadequate dialysis overall intact or multiple      medications including Ambien, trazodone, Flexeril, and Endocet      being used in the last 2-3 days.  The patient also uses morphine      for pain relief.  The patient was admitted and given some fluid for      clinical dehydration.  The patient was investigated  for bacteremia      with the blood cultures, which were negative.  The patient's CT      scan did not reveal any abdominal abscess.  The patient was      continued on antibiotic therapy for his previous MRSA abscess with      vanc and Fortaz.  The patient did not have any signs of pneumonia  clinically, and chest x-ray was negative.  The patient made minimal      urine, so it could not be examined for urinary tract infection with      urine microscopy or culture.  The patient appeared to be improving      over 24 hours of observation and discharged home with recovery,      almost to his baseline.  2. Hypothyroidism.  The patient had presented with generalized      weakness, which also could be secondary to hypothyroidism,      therefore TSH levels were obtained, which were abnormal.  Upon      discussing this with the patient, it was discovered that the      patient in past has been diagnosed with hypothyroidism and treated      with thyroxin supplementation.  However, since he moved from [place]      to [place] area this medication was not continued.  The patient      was restarted on thyroid supplementation with 50 mcg for now.  Free      T4 was pending at the time and the patient will follow up with his      primary care Ellie Bryand for further management of his hypothyroidism.  3. End-stage renal disease.  The patient was continued on      hemodialysis.  Renal service was consulted, which removed about 5 L      fluid on the day of admission on hemodialysis.  The patient felt      much better after the dialysis and would now be followed up for      [facility]mod[facility] at the [facility].  4. Fevers.  The patient had reported subjective fevers, however did      not reveal any fevers or chills during this admission.  There was      no open source for infection that was discovered during this      admission.  The patient's blood cultures were negative.  The      patient will  finish antibiotic therapy for previous MRSA infection.   The patient vitals at the time of discharge; temperature 98, blood  pressure of 113/76, pulse of 76, respiratory rate of 16, 99% O2  saturation on room air.  The patient's cardiac panel x3 were negative.  The patient's TSH was 17.99.      Pershing Cox, MD PhD  Electronically Signed      Evette Doffing, M.D.  Electronically Signed    RS/MEDQ  D:  05/31/2008  T:  05/31/2008  Job:  [id number]

## 2010-05-17 NOTE — Op Note (Signed)
[name], [name]            ACCOUNT NO.:  [id number]   MEDICAL RECORD NO.:  [id number]          PATIENT TYPE:  INP   LOCATION:  6742                         FACILITY:  [facility]   PHYSICIAN:  Rosetta Posner, M.D.    DATE OF BIRTH:  [birth year]   DATE OF PROCEDURE:  08/24/2008  DATE OF DISCHARGE:                               OPERATIVE REPORT   PREOPERATIVE DIAGNOSIS:  End-stage renal disease with recent bleed from  severely degenerated left femoral loop arteriovenous Gore-Tex graft.   POSTOPERATIVE DIAGNOSIS:  End-stage renal disease with recent bleed from  a severely degenerated left femoral loop arteriovenous Gore-Tex graft.   PROCEDURES:  1. Right femoral 55-cm Palindrome catheter.  2. New left femoral loop arteriovenous Gore-Tex graft.   SURGEON:  Rosetta Posner, MD   ASSISTANT:  Wray Kearns, PA-C   ANESTHESIA:  LMA.   COMPLICATIONS:  None.   DISPOSITION:  To recovery room stable.   PROCEDURE IN DETAIL:  The patient was taken to the operating room,  placed in supine position where the area of the right and left neck were  imaged with ultrasound.  The patient had had multiple prior catheters  and his right internal jugular vein was not visualized.  The patient had  a small left internal jugular vein.  The patient's right and left neck  and chest prepped and draped in a usual sterile fashion.  The patient  was placed in Trendelenburg position and the left IJ was accessed, but  the catheter would not pass centrally.  The patient had a contrast  allergy and therefore further attempts were abandoned.  Attempts made at  accessing the left subclavian, which was also unsuccessful.  The patient  had prior bilateral subclavian fractures.  For this reason, a decision  was made to place femoral catheter.  The right femoral vein was imaged  and was patent.  The patient's left groin and anterior thighs were  prepped and draped in usual sterile fashion.  Using an 18-gauge  needle,  the right common femoral vein was accessed and using a Seldinger  technique, a guidewire was passed up the level of right atrium.  Dilator  and peel-away sheath was passed over this.  A 55-cm Palindrome catheter  was brought onto the field.  Separate stab was made on the anterolateral  thigh, and the catheter was brought through the subcutaneous tunnel.  The catheter was then passed through the peel-away sheath up to the  level of the right atrium.  Both lumens flushed and aspirated easily and  were locked with 1000 mL heparin.  The catheter was secured to the skin  with 3-0 nylon stitch and the entry site was closed with a 3-0  subcuticular Vicryl stitch.  Finally, attention was turned to the left  thigh.  An incision was made over the arterial limb of the graft near  the groin and also over the venous limb of the graft near the groin.  The graft was isolated in both of these sites.  The patient had a ring  to 6-mm Gore-Tex graft.  The patient had had imaging  of his femoral loop  and had had a severe bleed from the venous limb and also had a false  aneurysm that was being accessed on the arterial limb.  Decision was  made to place a new graft.  A new tunnel was created that was outside of  the circumference of the prior left femoral loop.  This was brought out  very superficially to allow access for the dialysis access.  The old  graft was still patent.  The old graft was occluded near the arterial  and near the venous anastomosis with the fistula clamps.  The graft was  divided near the arterial and venous anastomosis.  A new 6-mm graft was  brought to the tunnel was sewn end-to-end to the old graft near the  arterial and near the venous anastomosis.  Clamps were removed.  Good  thrill was noted.  Wounds were irrigated with saline.  Hemostasis with  electrocautery.  Wounds were closed with 3-0 Vicryl in the subcutaneous  and subcuticular tissues.  Benzoin and Steri-Strips were  applied.      Rosetta Posner, M.D.  Electronically Signed     TFE/MEDQ  D:  08/24/2008  T:  08/25/2008  Job:  [id number]

## 2010-05-17 NOTE — H&P (Signed)
NAMELEWARD, [name]            ACCOUNT NO.:  [id number]   MEDICAL RECORD NO.:  [id number]          PATIENT TYPE:  INP   LOCATION:  3304                         FACILITY:  [facility]   PHYSICIAN:  Darryl D. Prime, MD    DATE OF BIRTH:  Feb 13, 1965   DATE OF ADMISSION:  05/03/2008  DATE OF DISCHARGE:                              HISTORY & PHYSICAL   CODE STATUS:  Full code.   PRIMARY CARE PHYSICIAN:  Dr. Claiborne Billings.   NEPHROLOGIST:  Windy Kalata, M.D.   CHIEF COMPLAINT:  Chest discomfort and weakness, like I was having  fevers.   HISTORY OF PRESENT ILLNESS:  [name] is a 45 year old male with a  history of hypertension, spina bifida, end stage renal disease with a  history of possible MRSA abscess in his old left nephrostomy site who  presents with the above symptoms.  The patient had a CT scan here in the  emergency room on April 25 showing fluid collections that are improving  in size and are also slightly in the left kidney area, the left lower  pole of the kidney.  He had a swab at that time that grew out MRSA.  The  patient has since then been on vancomycin and gentamicin with dialysis,  but he notes on the day prior to admission significant feeling he was  going to pass out, feeling very feverish with possible myalgias.  He  notes associated chest tightness with these symptoms, and this has been  going on and off for the last two weeks.  The patient, in the emergency  room, was evaluated and there was concern for possible acute coronary  syndrome, and requested admission for rule out acute coronary syndrome.  The patient denies any associated nausea or sweats.  He does not he has  been having tingling in his hands.  He has noted drainage of blood and  pus recently over the last few weeks.   PAST MEDICAL/SURGICAL HISTORY:  As above.  He has a history of end-stage  renal disease and has been on dialysis since 1997 from his problems with  spina bifida.  Previous to that,  he had an ileoconduit, this is  nonfunctioning.  Although, he still makes small amounts of urine and  nephrostomy tubes.  He has a history of possible nephrostomy tube  infection since 2004.  The patient notes he is on dialysis Monday,  Wednesday, Friday via left groin graft.  The patient notes he has had  fistulas in both upper extremities which was not matured well and may  have had graft space as well in the upper extremities.  The patient has  history of obesity.   ALLERGIES:  FURADANTIN.  POSSIBLE ALLERGIES TO IV CONTRAST DYE, NOTES IT  CAUSES HIM TO HAVE A RASH SOMETIMES, BUT THIS APPARENTLY HAS BEEN IN THE  DISTANT PAST.  HE HAD A CT OF THE CONTRAST APRIL 26 HERE, AND HE WAS NOT  PREMEDICATED AND HAD NO PROBLEMS.  HE IS ALLERGIC TO MANDELAMINE, SULFA  DRUGS.   MEDICATIONS:  He did not bring his medication list with him.  The  patient may be on Lasix.   SOCIAL HISTORY:  No tobacco, alcohol or drug use that is significant.  He notes rare alcohol use.  He lives with his sister and niece.   FAMILY HISTORY:  Noncontributory.   REVIEW OF SYSTEMS:  A 14-point review of systems is negative unless  stated above.   PHYSICAL EXAMINATION:  VITAL SIGNS:  Temperature 98, blood pressure  120/70, pulse 80, respirations 14, saturation 98% on room air.  GENERAL:  The patient is an obese male, very pleasant, sitting upright  in no acute distress.  HEENT:  Normocephalic, atraumatic.  Pupils equal, round and reactive to  light.  Extraocular movements intact.  Oropharynx is dry.  NECK:  Supple with no lymphadenopathy, thyromegaly, carotid bruits.  CARDIOVASCULAR:  Regular rate and rhythm.  No murmurs, rubs or gallops.  LUNGS:  Clear to auscultation bilaterally.  ABDOMEN:  Obese, soft, nontender, nondistended.  He has an ostomy tube  that appears clean, dry and intact.  BACK:  On the left, he has a drainage site that was draining a small  amount of fluid that has been able to catheterize.   EXTREMITIES:  No cyanosis, clubbing.  He has significant edema of the  lower extremities and paraplegia.   LABORATORY DATA:  White count 7.6, hemoglobin 12.9, hematocrit 38.6,  platelets 112, segs 86%.  Sodium 138, potassium 4.2, chloride 98,  bicarbonate 24, BUN 54, creatinine 10.8, glucose 106, total protein 5.7.  Otherwise, normal LFTs.  Chest x-ray showed no acute cardiopulmonary  abnormality.  No change from April 26, 2008.  EKG shows sinus rhythm at  a vent rate of 88 beats per minute.  Cardiac markers were unremarkable.  D. dimer was only 0.42.  Lipase 22.   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  He has developed end stage renal disease.  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  He  notes Dr. Mercy Moore did plan to have him possibly have a nephrectomy for  this problem, and he was scheduled to see a urologist.  The patient  notes he is being very feverish at this time.  Acute coronary syndrome  was lower on the potential etiologies for his main concern at this  point.  We will get cardiac markers, however, and he will be on a  monitored bed, but he will be placed on antibiotics in the form of  gentamicin and vancomycin.  These will be continued as he has been on  this, and he did consult urology tonight, Dr. Janice Norrie, [phone], who notes  he will see him in the morning as he may require nephrectomy.  He will  also decide whether or not the patient needs an additional CT scan to  evaluate for possible progression disease.  DVT and GI prophylaxis will  be ordered.      Darryl D. Prime, MD  Electronically Signed     DDP/MEDQ  D:  05/04/2008  T:  05/04/2008  Job:  [id number]

## 2010-05-17 NOTE — Consult Note (Signed)
[name], [name]            ACCOUNT NO.:  [id number]   MEDICAL RECORD NO.:  [id number]          PATIENT TYPE:  INP   LOCATION:  1823                         FACILITY:  [facility]   PHYSICIAN:  James L. Deterding, M.D.DATE OF BIRTH:  [birth year]   DATE OF CONSULTATION:  05/22/2008  DATE OF DISCHARGE:                                 CONSULTATION   REASON FOR CONSULTATION:  End-stage renal disease on hemodialysis.   REQUESTING PHYSICIAN:  Evette Doffing, M.D.   CHIEF COMPLAINT:  Generalized weakness and generalized malaise.   HISTORY OF PRESENT ILLNESS:  [name] is a 45 year old male with  past medical history of end-stage renal disease on hemodialysis since  1997, now at [facility] Monday, Wednesday, Friday via  left groin AV graft, ileal loop conduit with minimal urine output,  nephrolithiasis with prior nephrostomy tubes, meningomyelocele and  hypertension.  He presents today with very nonspecific symptoms, mainly  generalized weakness/malaise, fever (T-max of 99.9), chills and just not  feeling well.  His main concerns are that he has had similar symptoms  and had a recent hospitalization with a left renal MRSA abscess and has  also had similar symptoms in the past associated with hyperkalemia.   He was recently discharged (early May) for renal abscess with MRSA.  Urology was consulted at that time who stated collections were stable  and actually smaller in size.  There were no interventions recommended  at that time, and recommendations were for medical management with IV  antibiotics including vancomycin and Fortaz until May 28, 2008 (per  patient).   PAST MEDICAL HISTORY:  1. End-stage renal disease on hemodialysis Monday, Wednesday, Friday      at [facility] via left groin AV graft.  2. Myelomeningocele.  3. Hypertension.  4. Nephrolithiasis.  5. History of nephrostomy tube.  6. Obesity.   MEDICATIONS:  1. Omeprazole 20 mg p.o. every  night.  2. Lorazepam 0.5 mg p.o. every night.  3. Lasix 80 mg p.o. daily.  4. Clonidine 0.3 mg p.o. t.i.d. p.r.n.  5. Endocet 10/650 mg p.o. p.r.n.  6. Kayexalate 15 mg p.o. p.r.n.  7. Atenolol 50 mg p.o. b.i.d.  8. Aspirin 325 mg p.o. daily.  9. Phenergan 25 mg p.o. q.6 h. p.r.n.  10.Trazodone 50 mg p.o. daily.  11.PhosLo 4 tablets p.o. t.i.d. with meals and 2 tablets p.o. with      snacks.  12.Fexofenadine 180 mg p.o. daily.  13.Os-Cal 500 mg p.o. b.i.d.  14.Vancomycin with dialysis.  15.Fortaz with dialysis.   These are the medications from his dialysis center.   ALLERGIES:  P.O. CONTRAST, SULFA, NORFLOXACIN, FURADANTIN, GABAPENTIN,  IODINE, MORPHINE, METHENAMINE AND DURICEF.   SOCIAL HISTORY:  The patient denies any tobacco, alcohol or drug use.   FAMILY HISTORY:  Noncontributory.   REVIEW OF SYSTEMS:  As per HPI.  All other systems reviewed and  negative.   PHYSICAL EXAMINATION:  In general, this is a pleasant obese white male  lying in bed in no acute distress.  HEENT:  He is normocephalic, atraumatic.  Pupils are equal, round, and  reactive to light.  Extraocular movements intact.  Oropharynx with poor  dentition and dry mucous membranes.  NECK:  Is supple without any lymphadenopathy, thyromegaly, or carotid  bruits.  Habitus is difficult to access, but there is no JVD.  CARDIOVASCULAR:  There are distant heart sounds with a positive S1-S2,  grade 1-2 systolic murmur noted at the left sternal border/apex.  LUNGS:  Are clear to auscultation bilaterally.  ABDOMEN:  Obese with positive bowel sounds, soft, nontender,  nondistended.  There is a large scar in the left flank area.  There is  also an osteotomy from his ileal loop conduit via right abdomen.  EXTREMITIES:  There is +1 pitting edema in the right lower extremity.  Left lower extremity has +2 pitting edema (the patient states this is  chronically more edematous than his other leg).  SKIN:  Findings as per  abdomen.  There is some mild erythema with calor  in the left lower extremity compared with his right.  NEUROLOGICAL:  The patient is awake, alert and oriented x3.  Cranial  nerves II-XII are grossly intact.  Strength is 5/5 in all 4 extremities.  Sensation is grossly intact.   ADMISSION LABORATORY DATA:  WBC 5.5, hemoglobin 12.3, platelets 100,000.  Sodium 138, potassium 4.7, chloride 94, bicarb 29, glucose 96, BUN 36,  creatinine 8.87, bilirubin 0.8, alkaline phosphatase 57, AST 17, ALT 10,  total protein 7.8, albumin 3.3, calcium 8.7.  Cardiac markers negative  x1.  Lipase is 21.  Magnesium is 2.4.  Lactic acid is 1.8.   IMAGES:  CT of abdomen - impression:  1. No acute findings.  2. Findings compatible with central venous obstruction, unchanged.  3. Renal atrophy with cystic changes and calcifications in this      chronic dialysis patient.  4. Unchanged atrophy of the left psoas muscle.   T pelvis - impression:  No acute findings or interval change in the  appearance of the pelvis.   ASSESSMENT AND PLAN:  1. End-stage renal disease.  The patient is a hemodialysis patient on      Monday, Wednesday, Friday schedule at [facility] via left groin AV      graft.  Will set up for dialysis today.  2. Generalized weakness/generalized malaise.  This is per primary      team.  Given review of initial workup including labs and radiology      as listed above, there is no clear etiology.  Remainder of workup      per primary.  3. Recent renal abscess with methicillin-resistant staphylococcus      aureus.  As mentioned above, CT done this admission without mention      of prior collections.  The patient reports fevers, though T-max      appears to be 99 and no current fever this admission.  There is no      leukocytosis noted either.  Will continue on vancomycin and Fortaz      until May 27.  The patient will likely receive surveillance blood      cultures after treatment is complete at the  outpatient dialysis      center.  Of note, the patient had recent blood cultures done at the      outpatient dialysis center, results not currently available but      will request.  4. Secondary hyperparathyroidism.  The patient is currently on      Fosrenol and Os-Cal.  Hectorol was recently discontinued.  Continue      with Fosrenol and Os-Cal and check and follow calcium and      phosphate.  We will adjust medications based on these values.  Of      note, his last PTH was 640 on April 28.  Based on calcium and      phosphorus levels, medications will be adjusted to address this.  5. Anemia.  Stable.  Currently, he is off of IV iron as well as ESAs.      If hemoglobin decreases below current level, will recommend      checking iron studies.  If this is the case and percent saturation      is less than 25 with a ferritin of less than 600, we will start on      IV Venofer.  6. Hypertension.  Currently soft blood pressure, questionable      etiology.  Assume volume status and/or infection are likely sources      (although there is no temperature or leukocytosis as per above).      Reviewing outpatient records, the patient usually runs a high blood      pressure and has clonidine and atenolol in his medication list (not      ordered this admission).  Will check daily weights and adjust      volume status during dialysis today.      Alphia Moh, MD  Electronically Signed     ______________________________  Joyice Faster. Deterding, M.D.    MA/MEDQ  D:  05/22/2008  T:  05/22/2008  Job:  [id number]

## 2010-05-17 NOTE — Assessment & Plan Note (Signed)
OFFICE VISIT   [name], [name]  DOB:  November 17, 1965                                       09/11/2008  [id number]   The patient presents today for followup after his new left femoral loop  graft placement on 08/24/2008 and right femoral Palindrome catheter.  He  had presented with extensive bleeding from a false aneurysm in his old  left femoral loop graft.  This had been placed in [place].  He reports that  he is having good function with the Palindrome catheter with no  complications.  His left leg incisions are all healing nicely.  He does  have the usual amount of erythema and induration with the new graft.  His graft is patent.  I suggested that he wait an initial 2 weeks and  then begin using the graft in his left leg and then removal of the  Palindrome catheter.   Rosetta Posner, M.D.  Electronically Signed   TFE/MEDQ  D:  09/11/2008  T:  09/14/2008  Job:  [id number]   cc:   Andree Elk [facility]  [employer]

## 2010-05-17 NOTE — Assessment & Plan Note (Signed)
OFFICE VISIT   ODES, PINT  DOB:  June 26, 1965                                       09/30/2008  [id number]   The patient comes in today for evaluation of his left femoral loop AV  Gore-Tex graft.  This was placed urgently after he had a  major bleed  from a pseudoaneurysm of an existing right femoral loop AV Gore-Tex  graft.  He is a long complicated access history in [place].  He dialyzes at  [facility].   On physical exam today, he does have been good healing of all of the  surgical incisions.  He does have a positive bruit present over the AV  graft.  He does have some induration over the graft from the new graft  placement.   I feel that he is acceptable to have access of his new graft on Friday,  10/01, his next hemodialysis access and would recommend removal of his  catheter after three successful dialysis treatments.  He understands  that he, in all likelihood, will eventually occlude his graft and  require thrombolysis versus surgical thrombectomy when this occurs.  We  will see him again on an as-needed basis.   Rosetta Posner, M.D.  Electronically Signed   TFE/MEDQ  D:  09/30/2008  T:  10/01/2008  Job:  [id number]

## 2010-05-17 NOTE — Discharge Summary (Signed)
[name], [name]            ACCOUNT NO.:  [id number]   MEDICAL RECORD NO.:  [id number]          PATIENT TYPE:  INP   LOCATION:  [id number]                         FACILITY:  [facility]   PHYSICIAN:  Noel Christmas, MD    DATE OF BIRTH:  01-12-1965   DATE OF ADMISSION:  05/04/2008  DATE OF DISCHARGE:  05/06/2008                               DISCHARGE SUMMARY   ADDENDUM   Please refer to discharge summary by Dr. Ellison Carwin for final  discharge diagnoses, discharge instructions, discharge medications, and  discharge medications, followup plan, and hospital course.   The patient however is to continue taking his vancomycin during  hemodialysis and Fortaz during hemodialysis for a total of 4 weeks.  Reassessment is to be done by the urologist and nephrologist.  The  patient has been seen by the urologist in the interim and according to  their opinion, the patient does not need any drainage of his abscess  since a repeat CT scan showed that he was reducing in amounts and there  was not enough for full percutaneous drainage.  The patient has not had  any new complaints overnight.  Chest pain seems to be much better.   PHYSICAL EXAMINATION:  VITAL SIGNS:  Temperature 98.8, pulse 65,  respiration 18, blood pressure 118/62, saturating 100% on room air.  CHEST:  Clear to auscultation bilaterally.  ABDOMEN:  Soft, nontender.  EXTREMITIES:  No clubbing, no cyanosis, no edema.  CARDIOVASCULAR:  First and second heart sounds only.  CENTRAL NERVOUS SYSTEM:  Nonfocal.   The patient is to keep up the appointment with urologist as scheduled  and to keep all appointments as indicated in the discharge instruction.   TIME USED FOR DISCHARGE PLANNING:  Greater than 30 minutes.      Noel Christmas, MD  Electronically Signed     GU/MEDQ  D:  05/06/2008  T:  05/07/2008  Job:  [id number]

## 2010-05-24 ENCOUNTER — Encounter: Payer: Self-pay | Admitting: Vascular Surgery

## 2010-05-24 ENCOUNTER — Ambulatory Visit: Payer: Self-pay | Admitting: Vascular Surgery

## 2010-05-26 ENCOUNTER — Emergency Department (HOSPITAL_COMMUNITY)
Admission: EM | Admit: 2010-05-26 | Discharge: 2010-05-26 | Disposition: A | Discharge status: Home / Self Care | Payer: Medicare Other | Attending: Emergency Medicine | Admitting: Emergency Medicine

## 2010-05-26 ENCOUNTER — Emergency Department (HOSPITAL_COMMUNITY): Payer: Medicare Other

## 2010-05-26 DIAGNOSIS — R059 Cough, unspecified: Secondary | ICD-10-CM | POA: Insufficient documentation

## 2010-05-26 DIAGNOSIS — I120 Hypertensive chronic kidney disease with stage 5 chronic kidney disease or end stage renal disease: Secondary | ICD-10-CM | POA: Insufficient documentation

## 2010-05-26 DIAGNOSIS — N186 End stage renal disease: Secondary | ICD-10-CM | POA: Insufficient documentation

## 2010-05-26 DIAGNOSIS — Z992 Dependence on renal dialysis: Secondary | ICD-10-CM | POA: Insufficient documentation

## 2010-05-26 DIAGNOSIS — R05 Cough: Secondary | ICD-10-CM | POA: Insufficient documentation

## 2010-05-26 DIAGNOSIS — J40 Bronchitis, not specified as acute or chronic: Secondary | ICD-10-CM | POA: Insufficient documentation

## 2010-05-26 DIAGNOSIS — Z79899 Other long term (current) drug therapy: Secondary | ICD-10-CM | POA: Insufficient documentation

## 2010-05-26 DIAGNOSIS — R079 Chest pain, unspecified: Secondary | ICD-10-CM | POA: Insufficient documentation

## 2010-05-26 DIAGNOSIS — R509 Fever, unspecified: Secondary | ICD-10-CM | POA: Insufficient documentation

## 2010-05-26 LAB — CBC
HCT: 37.2 % — ABNORMAL LOW (ref 39.0–52.0)
Hemoglobin: 11.5 g/dL — ABNORMAL LOW (ref 13.0–17.0)
MCH: 28.8 pg (ref 26.0–34.0)
MCHC: 30.9 g/dL (ref 30.0–36.0)
MCV: 93.2 fL (ref 78.0–100.0)
Platelets: 152 10*3/uL (ref 150–400)
RBC: 3.99 MIL/uL — ABNORMAL LOW (ref 4.22–5.81)
RDW: 16.2 % — ABNORMAL HIGH (ref 11.5–15.5)
WBC: 6.6 10*3/uL (ref 4.0–10.5)

## 2010-05-26 LAB — POCT I-STAT, CHEM 8
BUN: 29 mg/dL — ABNORMAL HIGH (ref 6–23)
Calcium, Ion: 1.06 mmol/L — ABNORMAL LOW (ref 1.12–1.32)
Chloride: 99 mEq/L (ref 96–112)
Creatinine, Ser: 7.9 mg/dL — ABNORMAL HIGH (ref 0.4–1.5)
Glucose, Bld: 120 mg/dL — ABNORMAL HIGH (ref 70–99)
HCT: 38 % — ABNORMAL LOW (ref 39.0–52.0)
Hemoglobin: 12.9 g/dL — ABNORMAL LOW (ref 13.0–17.0)
Potassium: 4.9 mEq/L (ref 3.5–5.1)
Sodium: 138 mEq/L (ref 135–145)
TCO2: 33 mmol/L (ref 0–100)

## 2010-05-26 LAB — TROPONIN I: Troponin I: <0.3 ng/mL (ref <0.30)

## 2010-05-26 LAB — CK TOTAL AND CKMB (NOT AT ARMC)
CK, MB: 0.9 ng/mL (ref 0.3–4.0)
Relative Index: INVALID (ref 0.0–2.5)
Total CK: 35 U/L (ref 7–232)

## 2010-05-26 LAB — DIFFERENTIAL
Basophils Absolute: 0 10*3/uL (ref 0.0–0.1)
Basophils Relative: 0 % (ref 0–1)
Eosinophils Absolute: 0.2 10*3/uL (ref 0.0–0.7)
Eosinophils Relative: 3 % (ref 0–5)
Lymphocytes Relative: 13 % (ref 12–46)
Lymphs Abs: 0.9 10*3/uL (ref 0.7–4.0)
Monocytes Absolute: 0.6 10*3/uL (ref 0.1–1.0)
Monocytes Relative: 8 % (ref 3–12)
Neutro Abs: 5 10*3/uL (ref 1.7–7.7)
Neutrophils Relative %: 75 % (ref 43–77)

## 2010-05-31 ENCOUNTER — Ambulatory Visit (INDEPENDENT_AMBULATORY_CARE_PROVIDER_SITE_OTHER): Payer: Medicare Other | Admitting: Vascular Surgery

## 2010-05-31 DIAGNOSIS — N186 End stage renal disease: Secondary | ICD-10-CM

## 2010-05-31 NOTE — Assessment & Plan Note (Signed)
OFFICE VISIT  [name], [name] DOB:  September 06, 1965                                       05/31/2010 [id number]  Patient presents today for evaluation of left arm access.  I met patient urgently in August 2010.  He had recently moved from [place] and had multiple grafts in his both arms and his left leg.  He had presented with disruption and major bleed from his left leg access.  He had replacement of a new graft around the old degenerative 1 and a palindrome catheter placed at that time.  Fortunately, he has had excellent result from the subsequent 2 years with his graft.  He presents now with drainage from a left upper arm incision in the medial biceps area.  He reports this has been draining for approximately 5 months.  On exploration, I do not see any graft exposed, but clearly there is graft in the base of this wound with a chronic indolent drainage.  On physical exam today, his blood pressure is 125/77, pulse 89, respirations 22.  His temperature is 99.6.  There is no surrounding erythema and does have some old dark drainage from his wound.  There is no functional graft in his left arm.  I have recommended exploration or removal of nonfunctional graft.  He dialyzes on Monday, Wednesday, and Friday.  It will be a considerable time from a scheduling standpoint before I could do this surgery, and he understands that one of my partners will do this on a more expeditious basis.  He is scheduled for surgery on 05/31 with Dr. Scot Dock for exploration and removal of any nonfunctional exposed graft in his left arm as an outpatient.    Rosetta Posner, M.D. Electronically Signed  TFE/MEDQ  D:  05/31/2010  T:  05/31/2010  Job:  [id number]  cc:   Windy Kalata, M.D.

## 2010-06-02 ENCOUNTER — Ambulatory Visit (HOSPITAL_COMMUNITY)
Admission: RE | Admit: 2010-06-02 | Discharge: 2010-06-02 | Disposition: A | Discharge status: Home / Self Care | Payer: Medicare Other | Source: Ambulatory Visit | Attending: Vascular Surgery | Admitting: Vascular Surgery

## 2010-06-02 DIAGNOSIS — I120 Hypertensive chronic kidney disease with stage 5 chronic kidney disease or end stage renal disease: Secondary | ICD-10-CM

## 2010-06-02 DIAGNOSIS — I129 Hypertensive chronic kidney disease with stage 1 through stage 4 chronic kidney disease, or unspecified chronic kidney disease: Secondary | ICD-10-CM | POA: Insufficient documentation

## 2010-06-02 DIAGNOSIS — T827XXA Infection and inflammatory reaction due to other cardiac and vascular devices, implants and grafts, initial encounter: Secondary | ICD-10-CM | POA: Insufficient documentation

## 2010-06-02 DIAGNOSIS — Z992 Dependence on renal dialysis: Secondary | ICD-10-CM | POA: Insufficient documentation

## 2010-06-02 DIAGNOSIS — N186 End stage renal disease: Secondary | ICD-10-CM

## 2010-06-02 DIAGNOSIS — Y849 Medical procedure, unspecified as the cause of abnormal reaction of the patient, or of later complication, without mention of misadventure at the time of the procedure: Secondary | ICD-10-CM | POA: Insufficient documentation

## 2010-06-02 DIAGNOSIS — I2510 Atherosclerotic heart disease of native coronary artery without angina pectoris: Secondary | ICD-10-CM | POA: Insufficient documentation

## 2010-06-02 DIAGNOSIS — N189 Chronic kidney disease, unspecified: Secondary | ICD-10-CM | POA: Insufficient documentation

## 2010-06-02 LAB — POCT I-STAT 4, (NA,K, GLUC, HGB,HCT)
Glucose, Bld: 95 mg/dL (ref 70–99)
HCT: 40 % (ref 39.0–52.0)
Hemoglobin: 13.6 g/dL (ref 13.0–17.0)
Potassium: 4.7 mEq/L (ref 3.5–5.1)
Sodium: 140 mEq/L (ref 135–145)

## 2010-06-02 LAB — SURGICAL PCR SCREEN
MRSA, PCR: NEGATIVE
Staphylococcus aureus: POSITIVE — AB

## 2010-06-05 LAB — WOUND CULTURE
Culture: NO GROWTH
Gram Stain: NONE SEEN

## 2010-06-09 NOTE — Op Note (Signed)
  [name], [name]            ACCOUNT NO.:  [id number]  MEDICAL RECORD NO.:  [id number]           PATIENT TYPE:  O  LOCATION:  SDSC                         FACILITY:  [facility]  PHYSICIAN:  Judeth Cornfield. Scot Dock, M.D.DATE OF BIRTH:  1965/09/24  DATE OF PROCEDURE:  06/02/2010 DATE OF DISCHARGE:  06/02/2010                              OPERATIVE REPORT   PREOPERATIVE DIAGNOSIS:  Infected segment of left upper arm AV graft.  POSTOPERATIVE DIAGNOSIS:  Infected segment of left upper arm AV graft.  PROCEDURE: 1. Removal of infected segment of left upper arm AV graft. 2. Bovine pericardial patch angioplasty of left brachial artery.  SURGEON:  Judeth Cornfield. Scot Dock, MD  ASSISTANT:  Wray Kearns, PA-C  ANESTHESIA:  General.  TECHNIQUE:  The patient was taken to the operating room and there was a chronic draining tract in the left upper arm.  The skin around this area was compromised and I had to make an incision transversely to encompass the compromise skin.  An elliptical incision was made encompassing the wound.  The dissection tracked down to where there was an old retained segment of Gore-Tex grafts which was sewn to the brachial artery.  I then dissected the brachial artery proximal and distal to this for proximal and distal control.  The patient was then heparinized.  The brachial artery was then clipped proximally and distally and the entire remnant of graft was removed from the artery.  Bovine pericardial patch had been soaking and this was cut to the appropriate size and then sewn as a patch using continuous 6-0 Prolene suture.  At the completion, there was an excellent ulnar signal at the Doppler and a triphasic brachial signal.  Hemostasis was obtained in the wound.  The heparin was partially reversed with protamine.  The wound was closed with a deep layer of 3-0 Vicryl and the skin closed with 4-0 Vicryl.  Sterile dressing was applied.  The patient tolerated the  procedure well, was transferred to the recovery room in stable condition.  All needle and sponge counts were correct.     Judeth Cornfield. Scot Dock, M.D.     CSD/MEDQ  D:  06/02/2010  T:  06/03/2010  Job:  [id number]  Electronically Signed by Deitra Mayo M.D. on 06/09/2010 03:15:24 PM

## 2010-06-16 ENCOUNTER — Ambulatory Visit: Payer: Medicare Other

## 2010-06-17 ENCOUNTER — Ambulatory Visit: Payer: Medicare Other

## 2010-07-13 ENCOUNTER — Ambulatory Visit: Payer: Medicare Other | Admitting: Vascular Surgery

## 2010-07-31 ENCOUNTER — Emergency Department (HOSPITAL_COMMUNITY)
Admission: EM | Admit: 2010-07-31 | Discharge: 2010-07-31 | Disposition: A | Discharge status: Home / Self Care | Payer: Medicare Other | Attending: Emergency Medicine | Admitting: Emergency Medicine

## 2010-07-31 DIAGNOSIS — N9989 Other postprocedural complications and disorders of genitourinary system: Secondary | ICD-10-CM | POA: Insufficient documentation

## 2010-07-31 DIAGNOSIS — I120 Hypertensive chronic kidney disease with stage 5 chronic kidney disease or end stage renal disease: Secondary | ICD-10-CM | POA: Insufficient documentation

## 2010-07-31 DIAGNOSIS — Q059 Spina bifida, unspecified: Secondary | ICD-10-CM | POA: Insufficient documentation

## 2010-07-31 DIAGNOSIS — R319 Hematuria, unspecified: Secondary | ICD-10-CM | POA: Insufficient documentation

## 2010-07-31 DIAGNOSIS — IMO0002 Reserved for concepts with insufficient information to code with codable children: Secondary | ICD-10-CM | POA: Insufficient documentation

## 2010-07-31 DIAGNOSIS — Z992 Dependence on renal dialysis: Secondary | ICD-10-CM | POA: Insufficient documentation

## 2010-07-31 DIAGNOSIS — N186 End stage renal disease: Secondary | ICD-10-CM | POA: Insufficient documentation

## 2010-07-31 DIAGNOSIS — Y846 Urinary catheterization as the cause of abnormal reaction of the patient, or of later complication, without mention of misadventure at the time of the procedure: Secondary | ICD-10-CM | POA: Insufficient documentation

## 2010-07-31 LAB — CBC
HCT: 33.6 % — ABNORMAL LOW (ref 39.0–52.0)
Hemoglobin: 10.5 g/dL — ABNORMAL LOW (ref 13.0–17.0)
MCH: 28.8 pg (ref 26.0–34.0)
MCHC: 31.3 g/dL (ref 30.0–36.0)
MCV: 92.1 fL (ref 78.0–100.0)
Platelets: 128 10*3/uL — ABNORMAL LOW (ref 150–400)
RBC: 3.65 MIL/uL — ABNORMAL LOW (ref 4.22–5.81)
RDW: 16.2 % — ABNORMAL HIGH (ref 11.5–15.5)
WBC: 6.6 10*3/uL (ref 4.0–10.5)

## 2010-07-31 LAB — DIFFERENTIAL
Basophils Absolute: 0 10*3/uL (ref 0.0–0.1)
Basophils Relative: 0 % (ref 0–1)
Eosinophils Absolute: 0.1 10*3/uL (ref 0.0–0.7)
Eosinophils Relative: 2 % (ref 0–5)
Lymphocytes Relative: 9 % — ABNORMAL LOW (ref 12–46)
Lymphs Abs: 0.6 10*3/uL — ABNORMAL LOW (ref 0.7–4.0)
Monocytes Absolute: 0.6 10*3/uL (ref 0.1–1.0)
Monocytes Relative: 8 % (ref 3–12)
Neutro Abs: 5.3 10*3/uL (ref 1.7–7.7)
Neutrophils Relative %: 81 % — ABNORMAL HIGH (ref 43–77)

## 2010-07-31 LAB — BASIC METABOLIC PANEL
BUN: 35 mg/dL — ABNORMAL HIGH (ref 6–23)
CO2: 29 mEq/L (ref 19–32)
Calcium: 10.4 mg/dL (ref 8.4–10.5)
Chloride: 93 mEq/L — ABNORMAL LOW (ref 96–112)
Creatinine, Ser: 7.92 mg/dL — ABNORMAL HIGH (ref 0.50–1.35)
GFR calc Af Amer: 9 mL/min — ABNORMAL LOW (ref >60)
GFR calc non Af Amer: 7 mL/min — ABNORMAL LOW (ref >60)
Glucose, Bld: 140 mg/dL — ABNORMAL HIGH (ref 70–99)
Potassium: 4.5 mEq/L (ref 3.5–5.1)
Sodium: 137 mEq/L (ref 135–145)

## 2010-07-31 LAB — URINALYSIS, ROUTINE W REFLEX MICROSCOPIC
Glucose, UA: 100 mg/dL — AB
Ketones, ur: 40 mg/dL — AB
Nitrite: POSITIVE — AB
Protein, ur: >300 mg/dL — AB
Specific Gravity, Urine: 1.044 — ABNORMAL HIGH (ref 1.005–1.030)
Urobilinogen, UA: 4 mg/dL — ABNORMAL HIGH (ref 0.0–1.0)
pH: 6.5 (ref 5.0–8.0)

## 2010-07-31 LAB — URINE MICROSCOPIC-ADD ON

## 2010-08-02 LAB — URINE CULTURE
Colony Count: >=100000
Culture  Setup Time: 201207291140

## 2011-02-08 ENCOUNTER — Other Ambulatory Visit: Payer: Self-pay | Admitting: Nephrology

## 2011-02-14 ENCOUNTER — Other Ambulatory Visit: Payer: Medicare Other

## 2011-10-25 DIAGNOSIS — E162 Hypoglycemia, unspecified: Secondary | ICD-10-CM | POA: Insufficient documentation

## 2012-06-21 ENCOUNTER — Encounter: Payer: Self-pay | Admitting: Vascular Surgery

## 2012-06-24 ENCOUNTER — Encounter (HOSPITAL_COMMUNITY): Payer: Self-pay

## 2012-06-24 ENCOUNTER — Emergency Department (HOSPITAL_COMMUNITY)
Admission: EM | Admit: 2012-06-24 | Discharge: 2012-06-25 | Disposition: A | Discharge status: Home / Self Care | Payer: Medicare Other | Attending: Emergency Medicine | Admitting: Emergency Medicine

## 2012-06-24 DIAGNOSIS — IMO0002 Reserved for concepts with insufficient information to code with codable children: Secondary | ICD-10-CM | POA: Insufficient documentation

## 2012-06-24 DIAGNOSIS — T792XXA Traumatic secondary and recurrent hemorrhage and seroma, initial encounter: Secondary | ICD-10-CM

## 2012-06-24 DIAGNOSIS — Z79899 Other long term (current) drug therapy: Secondary | ICD-10-CM | POA: Insufficient documentation

## 2012-06-24 DIAGNOSIS — Z8669 Personal history of other diseases of the nervous system and sense organs: Secondary | ICD-10-CM | POA: Insufficient documentation

## 2012-06-24 DIAGNOSIS — Y841 Kidney dialysis as the cause of abnormal reaction of the patient, or of later complication, without mention of misadventure at the time of the procedure: Secondary | ICD-10-CM | POA: Insufficient documentation

## 2012-06-24 DIAGNOSIS — Z862 Personal history of diseases of the blood and blood-forming organs and certain disorders involving the immune mechanism: Secondary | ICD-10-CM | POA: Insufficient documentation

## 2012-06-24 DIAGNOSIS — Z992 Dependence on renal dialysis: Secondary | ICD-10-CM | POA: Insufficient documentation

## 2012-06-24 DIAGNOSIS — Q059 Spina bifida, unspecified: Secondary | ICD-10-CM | POA: Insufficient documentation

## 2012-06-24 DIAGNOSIS — N186 End stage renal disease: Secondary | ICD-10-CM | POA: Insufficient documentation

## 2012-06-24 DIAGNOSIS — K219 Gastro-esophageal reflux disease without esophagitis: Secondary | ICD-10-CM | POA: Insufficient documentation

## 2012-06-24 DIAGNOSIS — Z7982 Long term (current) use of aspirin: Secondary | ICD-10-CM | POA: Insufficient documentation

## 2012-06-24 DIAGNOSIS — F411 Generalized anxiety disorder: Secondary | ICD-10-CM | POA: Insufficient documentation

## 2012-06-24 DIAGNOSIS — G4700 Insomnia, unspecified: Secondary | ICD-10-CM | POA: Insufficient documentation

## 2012-06-24 HISTORY — DX: End stage renal disease: N18.6

## 2012-06-24 HISTORY — DX: Insomnia, unspecified: G47.00

## 2012-06-24 HISTORY — DX: Anxiety disorder, unspecified: F41.9

## 2012-06-24 HISTORY — DX: Dependence on renal dialysis: Z99.2

## 2012-06-24 HISTORY — DX: Gastro-esophageal reflux disease without esophagitis: K21.9

## 2012-06-24 HISTORY — DX: Spina bifida, unspecified: Q05.9

## 2012-06-24 NOTE — ED Notes (Signed)
Pt brought to ED via [facility].  Pt has dialysis graft to right inner thigh, pt had scab on top of graft that he scratched off tonight.  Pt covered and held pressure but was unable to stop bleeding.  Upon EMS arrival bleeding was controlled- dressing applied by EMS for transport.  Pt had full dialysis treatment today.

## 2012-06-24 NOTE — ED Notes (Signed)
MD at bedside. 

## 2012-06-24 NOTE — ED Notes (Signed)
Patient presents with c/o bleeding from left femoral dialysis graft. Receives treatment every M-W-F; had full treatment today without any complications. Reports that his graft began to bleed this evening and he was unable to stop it. Bleeding controlled by EMS at ED arrival. Patient denies any headache, dizziness, CP, or SOB. Graft site clean, dry and intact. No redness. No pain.

## 2012-06-24 NOTE — ED Provider Notes (Signed)
History    CSN: [id number] Arrival date & time 06/24/12  2150  First MD Initiated Contact with Patient 06/24/12 2327     Chief Complaint  Patient presents with  . Vascular Access Problem   (Consider location/radiation/quality/duration/timing/severity/associated sxs/prior Treatment) HPI Hx per PT - Has L thigh dialysis graft.  Full dialysis today.  At home tonight, felt a "squishy scab" at his dialysis site and it started bleeding. He called EMS.  EMS applied bandage and bleeding stopped PTA.  Bleeding lasted about 15-20 min. He takes ASA daily. Gets M-W-F dialysis.  Ne weakness, numbness, dizzy, near syncope or SOB.     Past Medical History  Diagnosis Date  . End stage renal failure on dialysis   . Spina bifida   . Seizures     last 1998  . GERD (gastroesophageal reflux disease)   . Insomnia   . Anxiety    Past Surgical History  Procedure Laterality Date  . Insertion of dialysis catheter    . Knee surgery Right   . Lithotripsy      x 3   No family history on file. History  Substance Use Topics  . Smoking status: Never Smoker   . Smokeless tobacco: Never Used  . Alcohol Use: No    Review of Systems  Constitutional: Negative for fever and chills.  HENT: Negative for neck pain and neck stiffness.   Eyes: Negative for pain.  Respiratory: Negative for shortness of breath.   Cardiovascular: Negative for chest pain.  Gastrointestinal: Negative for vomiting and abdominal pain.  Genitourinary: Negative for dysuria.  Musculoskeletal: Negative for back pain.  Skin: Positive for wound. Negative for rash.  Neurological: Negative for headaches.  All other systems reviewed and are negative.    Allergies  Gabapentin and Sulfa antibiotics  Home Medications   Current Outpatient Rx  Name  Route  Sig  Dispense  Refill  . calcium acetate, Phos Binder, (PHOSLYRA) 667 MG/5ML SOLN   Oral   Take 667 mg by mouth 3 (three) times daily with meals.         . famotidine  (PEPCID) 40 MG tablet   Oral   Take 40 mg by mouth daily.         . furosemide (LASIX) 80 MG tablet   Oral   Take 80 mg by mouth 2 (two) times daily.         Marland Kitchen levothyroxine (SYNTHROID, LEVOTHROID) 125 MCG tablet   Oral   Take 125 mcg by mouth daily before breakfast.         . promethazine (PHENERGAN) 25 MG tablet   Oral   Take 25 mg by mouth every 6 (six) hours as needed for nausea.         . traZODone (DESYREL) 100 MG tablet   Oral   Take 100 mg by mouth at bedtime.         Marland Kitchen zolpidem (AMBIEN) 10 MG tablet   Oral   Take 10 mg by mouth at bedtime as needed for sleep.          BP 111/68  Pulse 52  Temp(Src) 99 F (37.2 C) (Oral)  Resp 14  SpO2 97% Physical Exam  Constitutional: He is oriented to person, place, and time. He appears well-developed and well-nourished.  HENT:  Head: Normocephalic and atraumatic.  Eyes: EOM are normal. Pupils are equal, round, and reactive to light.  Neck: Neck supple.  Cardiovascular: Regular rhythm and intact distal pulses.  Pulmonary/Chest: Effort normal. No respiratory distress.  Musculoskeletal:  LLE graft site: scab in place, no active bleeding.  Distal N/V and pulses intact and equal.   Neurological: He is alert and oriented to person, place, and time.  Skin: Skin is warm and dry.    ED Course  Procedures (including critical care time)  Wound care, non-adhesive dressing re-applied. PT observed in the ED.    12:59 AM no bleeding on the ER - has been here for over 2 hours now. Plan d/c home, f/u PCP and has scheduled f/u this week with his Vascular surgeon  Bleeding precautions provided  MDM  Bleeding from diakysis site - controlled PTA  Wound care  VS and nurses notes reviewed  Teressa Lower, MD 06/25/12 0101

## 2012-06-25 NOTE — ED Notes (Signed)
Patient requests food & drink. Ok per Dr. Loistine Chance. [place] sandwich bag meal and sprite provided to patient

## 2012-06-25 NOTE — ED Notes (Signed)
Dressing changed with non-adhesive pad site clean dry bleeding controlled

## 2012-06-26 ENCOUNTER — Encounter: Payer: Self-pay | Admitting: Vascular Surgery

## 2012-06-26 ENCOUNTER — Other Ambulatory Visit: Payer: Self-pay | Admitting: *Deleted

## 2012-06-27 ENCOUNTER — Inpatient Hospital Stay (HOSPITAL_COMMUNITY): Payer: Medicare Other | Admitting: Anesthesiology

## 2012-06-27 ENCOUNTER — Encounter (HOSPITAL_COMMUNITY): Payer: Self-pay | Admitting: Anesthesiology

## 2012-06-27 ENCOUNTER — Encounter: Payer: Self-pay | Admitting: Vascular Surgery

## 2012-06-27 ENCOUNTER — Ambulatory Visit (INDEPENDENT_AMBULATORY_CARE_PROVIDER_SITE_OTHER): Payer: Medicare Other | Admitting: Vascular Surgery

## 2012-06-27 ENCOUNTER — Encounter (HOSPITAL_COMMUNITY)
Admission: AD | Disposition: A | Discharge status: Home / Self Care | Payer: Self-pay | Source: Ambulatory Visit | Attending: Vascular Surgery

## 2012-06-27 ENCOUNTER — Observation Stay (HOSPITAL_COMMUNITY)
Admission: AD | Admit: 2012-06-27 | Discharge: 2012-06-28 | Disposition: A | Discharge status: Home / Self Care | Payer: Medicare Other | Source: Ambulatory Visit | Attending: Vascular Surgery | Admitting: Vascular Surgery

## 2012-06-27 ENCOUNTER — Other Ambulatory Visit: Payer: Self-pay | Admitting: *Deleted

## 2012-06-27 ENCOUNTER — Other Ambulatory Visit: Payer: Self-pay

## 2012-06-27 ENCOUNTER — Encounter (HOSPITAL_COMMUNITY): Payer: Self-pay | Admitting: Certified Registered Nurse Anesthetist

## 2012-06-27 VITALS — BP 156/68 | HR 81 | Temp 99.3°F | Ht 71.0 in | Wt 275.0 lb

## 2012-06-27 DIAGNOSIS — Z79899 Other long term (current) drug therapy: Secondary | ICD-10-CM | POA: Insufficient documentation

## 2012-06-27 DIAGNOSIS — N186 End stage renal disease: Secondary | ICD-10-CM

## 2012-06-27 DIAGNOSIS — T82898A Other specified complication of vascular prosthetic devices, implants and grafts, initial encounter: Principal | ICD-10-CM | POA: Insufficient documentation

## 2012-06-27 DIAGNOSIS — I724 Aneurysm of artery of lower extremity: Secondary | ICD-10-CM | POA: Insufficient documentation

## 2012-06-27 DIAGNOSIS — Q059 Spina bifida, unspecified: Secondary | ICD-10-CM | POA: Insufficient documentation

## 2012-06-27 DIAGNOSIS — Y832 Surgical operation with anastomosis, bypass or graft as the cause of abnormal reaction of the patient, or of later complication, without mention of misadventure at the time of the procedure: Secondary | ICD-10-CM | POA: Insufficient documentation

## 2012-06-27 DIAGNOSIS — T829XXA Unspecified complication of cardiac and vascular prosthetic device, implant and graft, initial encounter: Secondary | ICD-10-CM | POA: Insufficient documentation

## 2012-06-27 DIAGNOSIS — Z992 Dependence on renal dialysis: Secondary | ICD-10-CM | POA: Insufficient documentation

## 2012-06-27 HISTORY — PX: AV FISTULA PLACEMENT: SHX1204

## 2012-06-27 HISTORY — DX: Hypothyroidism, unspecified: E03.9

## 2012-06-27 HISTORY — DX: Anemia, unspecified: D64.9

## 2012-06-27 LAB — POCT I-STAT 4, (NA,K, GLUC, HGB,HCT)
Glucose, Bld: 88 mg/dL (ref 70–99)
HCT: 33 % — ABNORMAL LOW (ref 39.0–52.0)
Hemoglobin: 11.2 g/dL — ABNORMAL LOW (ref 13.0–17.0)
Potassium: 4.7 mEq/L (ref 3.5–5.1)
Sodium: 139 mEq/L (ref 135–145)

## 2012-06-27 LAB — SURGICAL PCR SCREEN
MRSA, PCR: NEGATIVE
Staphylococcus aureus: NEGATIVE

## 2012-06-27 SURGERY — INSERTION OF ARTERIOVENOUS (AV) GORE-TEX GRAFT THIGH
Anesthesia: General | Site: Thigh | Laterality: Left | Wound class: Clean

## 2012-06-27 MED ORDER — THROMBIN 20000 UNITS EX SOLR
CUTANEOUS | Status: AC
  Filled 2012-06-27: qty 20000

## 2012-06-27 MED ORDER — DEXTROSE 5 % IV SOLN
750.0000 mg | INTRAVENOUS | Status: DC
  Filled 2012-06-27: qty 750

## 2012-06-27 MED ORDER — SODIUM CHLORIDE 0.9 % IJ SOLN
3.0000 mL | INTRAMUSCULAR | Status: DC | PRN
  Administered 2012-06-28: 3 mL via INTRAVENOUS

## 2012-06-27 MED ORDER — OXYCODONE HCL 5 MG PO TABS
5.0000 mg | ORAL_TABLET | ORAL | Status: DC | PRN
  Administered 2012-06-28: 5 mg via ORAL
  Filled 2012-06-27: qty 1

## 2012-06-27 MED ORDER — FUROSEMIDE 80 MG PO TABS
80.0000 mg | ORAL_TABLET | Freq: Two times a day (BID) | ORAL | Status: DC
  Filled 2012-06-27 (×4): qty 1

## 2012-06-27 MED ORDER — FENTANYL CITRATE 0.05 MG/ML IJ SOLN
INTRAMUSCULAR | Status: DC | PRN
  Administered 2012-06-27 (×2): 50 ug via INTRAVENOUS
  Administered 2012-06-27 (×3): 100 ug via INTRAVENOUS
  Administered 2012-06-27 (×2): 50 ug via INTRAVENOUS

## 2012-06-27 MED ORDER — SODIUM CHLORIDE 0.9 % IR SOLN
Status: DC | PRN
  Administered 2012-06-27: 19:00:00

## 2012-06-27 MED ORDER — ONDANSETRON HCL 4 MG/2ML IJ SOLN: 4.0000 mg | Freq: Four times a day (QID) | INTRAMUSCULAR | Status: DC | PRN

## 2012-06-27 MED ORDER — LEVOTHYROXINE SODIUM 125 MCG PO TABS
125.0000 ug | ORAL_TABLET | Freq: Every day | ORAL | Status: DC
  Administered 2012-06-28: 125 ug via ORAL
  Filled 2012-06-27 (×2): qty 1

## 2012-06-27 MED ORDER — OXYCODONE-ACETAMINOPHEN 10-325 MG PO TABS: 1.0000 | ORAL_TABLET | ORAL | Status: DC | PRN

## 2012-06-27 MED ORDER — DEXTROSE 5 % IV SOLN: 1.5000 g | Freq: Two times a day (BID) | INTRAVENOUS | Status: DC

## 2012-06-27 MED ORDER — MUPIROCIN 2 % EX OINT
TOPICAL_OINTMENT | Freq: Two times a day (BID) | CUTANEOUS | Status: DC
  Administered 2012-06-27: 1 via NASAL
  Filled 2012-06-27: qty 22

## 2012-06-27 MED ORDER — CALCIUM ACETATE (PHOS BINDER) 667 MG/5ML PO SOLN
667.0000 mg | Freq: Three times a day (TID) | ORAL | Status: DC
  Filled 2012-06-27 (×7): qty 5

## 2012-06-27 MED ORDER — ARTIFICIAL TEARS OP OINT
TOPICAL_OINTMENT | OPHTHALMIC | Status: DC | PRN
  Administered 2012-06-27: 1 via OPHTHALMIC

## 2012-06-27 MED ORDER — MUPIROCIN 2 % EX OINT
TOPICAL_OINTMENT | CUTANEOUS | Status: AC
  Filled 2012-06-27: qty 22

## 2012-06-27 MED ORDER — PHENYLEPHRINE HCL 10 MG/ML IJ SOLN
INTRAMUSCULAR | Status: DC | PRN
  Administered 2012-06-27 (×2): 40 ug via INTRAVENOUS
  Administered 2012-06-27: 80 ug via INTRAVENOUS

## 2012-06-27 MED ORDER — PHENOL 1.4 % MT LIQD
1.0000 | OROMUCOSAL | Status: DC | PRN
  Filled 2012-06-27: qty 177

## 2012-06-27 MED ORDER — HEPARIN SODIUM (PORCINE) 1000 UNIT/ML IJ SOLN
INTRAMUSCULAR | Status: DC | PRN
  Administered 2012-06-27: 9000 [IU] via INTRAVENOUS

## 2012-06-27 MED ORDER — DEXTROSE 5 % IV SOLN
1.5000 g | INTRAVENOUS | Status: DC
  Filled 2012-06-27: qty 1.5

## 2012-06-27 MED ORDER — OXYCODONE HCL 5 MG PO TABS: 5.0000 mg | ORAL_TABLET | Freq: Once | ORAL | Status: DC | PRN

## 2012-06-27 MED ORDER — FAMOTIDINE 40 MG PO TABS
40.0000 mg | ORAL_TABLET | Freq: Every day | ORAL | Status: DC
  Administered 2012-06-28: 40 mg via ORAL
  Filled 2012-06-27: qty 1

## 2012-06-27 MED ORDER — 0.9 % SODIUM CHLORIDE (POUR BTL) OPTIME
TOPICAL | Status: DC | PRN
  Administered 2012-06-27: 1000 mL

## 2012-06-27 MED ORDER — SODIUM CHLORIDE 0.9 % IV SOLN
10.0000 mg | INTRAVENOUS | Status: DC | PRN
  Administered 2012-06-27: 20 ug/min via INTRAVENOUS
  Administered 2012-06-27: 21:00:00 via INTRAVENOUS

## 2012-06-27 MED ORDER — ONDANSETRON HCL 4 MG/2ML IJ SOLN
INTRAMUSCULAR | Status: DC | PRN
  Administered 2012-06-27: 4 mg via INTRAVENOUS

## 2012-06-27 MED ORDER — MORPHINE SULFATE 2 MG/ML IJ SOLN
2.0000 mg | INTRAMUSCULAR | Status: DC | PRN
  Administered 2012-06-27: 2 mg via INTRAVENOUS
  Administered 2012-06-28: 5 mg via INTRAVENOUS
  Administered 2012-06-28 (×2): 2 mg via INTRAVENOUS
  Filled 2012-06-27: qty 1
  Filled 2012-06-27: qty 3
  Filled 2012-06-27 (×2): qty 1

## 2012-06-27 MED ORDER — PROTAMINE SULFATE 10 MG/ML IV SOLN
INTRAVENOUS | Status: DC | PRN
  Administered 2012-06-27: 30 mg via INTRAVENOUS

## 2012-06-27 MED ORDER — DEXTROSE 5 % IV SOLN
INTRAVENOUS | Status: DC | PRN
  Administered 2012-06-27: 19:00:00 via INTRAVENOUS

## 2012-06-27 MED ORDER — FLUTICASONE PROPIONATE 50 MCG/ACT NA SUSP
2.0000 | Freq: Every day | NASAL | Status: DC
  Administered 2012-06-28: 2 via NASAL
  Filled 2012-06-27: qty 16

## 2012-06-27 MED ORDER — OXYCODONE-ACETAMINOPHEN 5-325 MG PO TABS
1.0000 | ORAL_TABLET | ORAL | Status: DC | PRN
  Administered 2012-06-28: 1 via ORAL
  Filled 2012-06-27: qty 1

## 2012-06-27 MED ORDER — PROPOFOL 10 MG/ML IV BOLUS
INTRAVENOUS | Status: DC | PRN
  Administered 2012-06-27: 100 mg via INTRAVENOUS
  Administered 2012-06-27: 300 mg via INTRAVENOUS

## 2012-06-27 MED ORDER — PROMETHAZINE HCL 25 MG/ML IJ SOLN
6.2500 mg | INTRAMUSCULAR | Status: AC | PRN
  Administered 2012-06-27 (×2): 6.25 mg via INTRAVENOUS

## 2012-06-27 MED ORDER — THROMBIN 20000 UNITS EX SOLR
CUTANEOUS | Status: DC | PRN
  Administered 2012-06-27: 20:00:00 via TOPICAL

## 2012-06-27 MED ORDER — DEXTROSE 5 % IV SOLN
3.0000 g | Freq: Once | INTRAVENOUS | Status: AC
  Administered 2012-06-27: 3 g via INTRAVENOUS

## 2012-06-27 MED ORDER — LABETALOL HCL 5 MG/ML IV SOLN
10.0000 mg | INTRAVENOUS | Status: DC | PRN
  Filled 2012-06-27: qty 4

## 2012-06-27 MED ORDER — SODIUM CHLORIDE 0.9 % IJ SOLN
3.0000 mL | Freq: Two times a day (BID) | INTRAMUSCULAR | Status: DC
  Administered 2012-06-28 (×2): 3 mL via INTRAVENOUS

## 2012-06-27 MED ORDER — SODIUM CHLORIDE 0.9 % IV SOLN
INTRAVENOUS | Status: DC
  Administered 2012-06-27: 19:00:00 via INTRAVENOUS

## 2012-06-27 MED ORDER — LORAZEPAM 0.5 MG PO TABS
0.5000 mg | ORAL_TABLET | Freq: Every day | ORAL | Status: DC
  Administered 2012-06-28: 0.5 mg via ORAL
  Filled 2012-06-27: qty 1

## 2012-06-27 MED ORDER — LIDOCAINE HCL (CARDIAC) 20 MG/ML IV SOLN
INTRAVENOUS | Status: DC | PRN
  Administered 2012-06-27: 60 mg via INTRAVENOUS

## 2012-06-27 MED ORDER — DEXTROSE 5 % IV SOLN
1.5000 g | Freq: Once | INTRAVENOUS | Status: AC
  Administered 2012-06-28: 1.5 g via INTRAVENOUS
  Filled 2012-06-27: qty 1.5

## 2012-06-27 MED ORDER — PROMETHAZINE HCL 25 MG PO TABS: 25.0000 mg | ORAL_TABLET | Freq: Four times a day (QID) | ORAL | Status: DC | PRN

## 2012-06-27 MED ORDER — TRAZODONE HCL 100 MG PO TABS
100.0000 mg | ORAL_TABLET | Freq: Every day | ORAL | Status: DC
  Administered 2012-06-28: 100 mg via ORAL
  Filled 2012-06-27 (×2): qty 1

## 2012-06-27 MED ORDER — HYDROMORPHONE HCL PF 1 MG/ML IJ SOLN
0.2500 mg | INTRAMUSCULAR | Status: DC | PRN
  Administered 2012-06-27 (×2): 0.5 mg via INTRAVENOUS

## 2012-06-27 MED ORDER — SODIUM CHLORIDE 0.9 % IV SOLN: 250.0000 mL | INTRAVENOUS | Status: DC | PRN

## 2012-06-27 MED ORDER — HYDRALAZINE HCL 20 MG/ML IJ SOLN: 10.0000 mg | INTRAMUSCULAR | Status: DC | PRN

## 2012-06-27 MED ORDER — MIDAZOLAM HCL 5 MG/5ML IJ SOLN
INTRAMUSCULAR | Status: DC | PRN
  Administered 2012-06-27: 2 mg via INTRAVENOUS

## 2012-06-27 MED ORDER — ZOLPIDEM TARTRATE 5 MG PO TABS
10.0000 mg | ORAL_TABLET | Freq: Every evening | ORAL | Status: DC | PRN
  Administered 2012-06-28: 10 mg via ORAL
  Filled 2012-06-27: qty 2

## 2012-06-27 MED ORDER — METOPROLOL TARTRATE 1 MG/ML IV SOLN
2.0000 mg | INTRAVENOUS | Status: DC | PRN
  Filled 2012-06-27: qty 5

## 2012-06-27 MED ORDER — ASPIRIN 325 MG PO TABS
325.0000 mg | ORAL_TABLET | Freq: Every day | ORAL | Status: DC
  Administered 2012-06-28: 325 mg via ORAL
  Filled 2012-06-27: qty 1

## 2012-06-27 MED ORDER — HYDROMORPHONE HCL PF 1 MG/ML IJ SOLN
INTRAMUSCULAR | Status: AC
  Filled 2012-06-27: qty 1

## 2012-06-27 MED ORDER — CEFAZOLIN SODIUM 1-5 GM-% IV SOLN
INTRAVENOUS | Status: AC
  Filled 2012-06-27: qty 150

## 2012-06-27 MED ORDER — OXYCODONE HCL 5 MG/5ML PO SOLN: 5.0000 mg | Freq: Once | ORAL | Status: DC | PRN

## 2012-06-27 SURGICAL SUPPLY — 51 items
BLADE SURG 10 STRL SS (BLADE) ×2 IMPLANT
CANISTER SUCTION 2500CC (MISCELLANEOUS) ×2 IMPLANT
CATH EMB 4FR 40CM (CATHETERS) ×2 IMPLANT
CLIP TI MEDIUM 6 (CLIP) ×2 IMPLANT
CLIP TI WIDE RED SMALL 6 (CLIP) ×4 IMPLANT
CLOTH BEACON ORANGE TIMEOUT ST (SAFETY) ×2 IMPLANT
COVER PROBE W GEL 5X96 (DRAPES) ×2 IMPLANT
COVER SURGICAL LIGHT HANDLE (MISCELLANEOUS) ×2 IMPLANT
DERMABOND ADVANCED (GAUZE/BANDAGES/DRESSINGS) ×1
DERMABOND ADVANCED .7 DNX12 (GAUZE/BANDAGES/DRESSINGS) ×1 IMPLANT
DRAPE INCISE IOBAN 66X45 STRL (DRAPES) ×2 IMPLANT
ELECT REM PT RETURN 9FT ADLT (ELECTROSURGICAL) ×2
ELECTRODE REM PT RTRN 9FT ADLT (ELECTROSURGICAL) ×1 IMPLANT
GAUZE SPONGE 4X4 16PLY XRAY LF (GAUZE/BANDAGES/DRESSINGS) ×2 IMPLANT
GLOVE BIO SURGEON STRL SZ7 (GLOVE) ×2 IMPLANT
GLOVE BIOGEL PI IND STRL 7.0 (GLOVE) ×2 IMPLANT
GLOVE BIOGEL PI IND STRL 7.5 (GLOVE) ×2 IMPLANT
GLOVE BIOGEL PI INDICATOR 7.0 (GLOVE) ×2
GLOVE BIOGEL PI INDICATOR 7.5 (GLOVE) ×2
GLOVE SS BIOGEL STRL SZ 6.5 (GLOVE) ×1 IMPLANT
GLOVE SUPERSENSE BIOGEL SZ 6.5 (GLOVE) ×1
GLOVE SURG SS PI 7.0 STRL IVOR (GLOVE) ×6 IMPLANT
GLOVE SURG SS PI 7.5 STRL IVOR (GLOVE) ×4 IMPLANT
GOWN STRL NON-REIN LRG LVL3 (GOWN DISPOSABLE) ×8 IMPLANT
GRAFT GORETEX 6X40 (Vascular Products) ×2 IMPLANT
KIT BASIN OR (CUSTOM PROCEDURE TRAY) ×2 IMPLANT
KIT ROOM TURNOVER OR (KITS) ×2 IMPLANT
MARKER SKIN DUAL TIP RULER LAB (MISCELLANEOUS) ×2 IMPLANT
NS IRRIG 1000ML POUR BTL (IV SOLUTION) ×2 IMPLANT
PACK CV ACCESS (CUSTOM PROCEDURE TRAY) ×2 IMPLANT
PAD ARMBOARD 7.5X6 YLW CONV (MISCELLANEOUS) ×4 IMPLANT
SPONGE GAUZE 4X4 12PLY (GAUZE/BANDAGES/DRESSINGS) ×2 IMPLANT
SPONGE SURGIFOAM ABS GEL 100 (HEMOSTASIS) ×2 IMPLANT
STAPLER VISISTAT 35W (STAPLE) ×2 IMPLANT
SUT GORETEX 5 0 TT13 24 (SUTURE) ×4 IMPLANT
SUT MNCRL AB 4-0 PS2 18 (SUTURE) ×2 IMPLANT
SUT PROLENE 3 0 SH DA (SUTURE) ×6 IMPLANT
SUT PROLENE 3 0 SH1 36 (SUTURE) ×2 IMPLANT
SUT PROLENE 5 0 C 1 24 (SUTURE) ×6 IMPLANT
SUT PROLENE 5 0 C 1 36 (SUTURE) ×2 IMPLANT
SUT PROLENE 6 0 BV (SUTURE) ×8 IMPLANT
SUT VIC AB 2-0 CT1 27 (SUTURE) ×2
SUT VIC AB 2-0 CT1 TAPERPNT 27 (SUTURE) ×2 IMPLANT
SUT VIC AB 3-0 SH 27 (SUTURE) ×7
SUT VIC AB 3-0 SH 27X BRD (SUTURE) ×7 IMPLANT
SWAB COLLECTION DEVICE MRSA (MISCELLANEOUS) ×2 IMPLANT
TAPE CLOTH SURG 4X10 WHT LF (GAUZE/BANDAGES/DRESSINGS) ×2 IMPLANT
TOWEL OR 17X24 6PK STRL BLUE (TOWEL DISPOSABLE) ×2 IMPLANT
TOWEL OR 17X26 10 PK STRL BLUE (TOWEL DISPOSABLE) ×2 IMPLANT
TUBE ANAEROBIC SPECIMEN COL (MISCELLANEOUS) ×2 IMPLANT
WATER STERILE IRR 1000ML POUR (IV SOLUTION) ×2 IMPLANT

## 2012-06-27 NOTE — Progress Notes (Signed)
PER DR JACKSON NO EKG OR CXR NEEDED

## 2012-06-27 NOTE — H&P (View-Only) (Signed)
[facility]  Patient name: [name] MRN: [id number] DOB: 12-17-1965 Sex: male  REASON FOR ADMISSION: Pseudoaneurysm of left thigh AV graft at risk for hemorrhage. Referred by Dr. Precious Bard.  HPI: [name] is a 47 y.o. male states that he has had a left thigh AV graft for several years. He developed a pulsatile area over the lateral aspect of his graft approximately a month ago. Dr. Mercy Moore was concerned about this area and set him up for an off this visit. He states that recently the skin overlying this area peeled off. He denies any fever or chills. He dialyzes Monday Wednesdays and Fridays at [facility].  Of note, he had a similar problem in the past and apparently a pseudoaneurysm ruptured when he was in the emergency department and he coded. He is also witnessed someone else have a significant bleeding problems from the graft and is very afraid that his current wounds her risk for bleeding.  Past Medical History  Diagnosis Date  . End stage renal failure on dialysis   . Spina bifida   . Seizures     last 1998  . GERD (gastroesophageal reflux disease)   . Insomnia   . Anxiety    FAMILY HISTORY: There is no history of premature cardiovascular disease.  SOCIAL HISTORY: History  Substance Use Topics  . Smoking status: Never Smoker   . Smokeless tobacco: Never Used  . Alcohol Use: No   Allergies  Allergen Reactions  . Gabapentin     unknown  . Sulfa Antibiotics     unknown   Current Outpatient Prescriptions  Medication Sig Dispense Refill  . aspirin 325 MG tablet Take 325 mg by mouth daily.      . calcium acetate, Phos Binder, (PHOSLYRA) 667 MG/5ML SOLN Take 667 mg by mouth 3 (three) times daily with meals.      . famotidine (PEPCID) 40 MG tablet Take 40 mg by mouth daily.      . furosemide (LASIX) 80 MG tablet Take 80 mg by mouth 2 (two) times daily.      Marland Kitchen levothyroxine (SYNTHROID, LEVOTHROID) 125 MCG tablet Take 125 mcg  by mouth daily before breakfast.      . promethazine (PHENERGAN) 25 MG tablet Take 25 mg by mouth every 6 (six) hours as needed for nausea.      . traZODone (DESYREL) 100 MG tablet Take 100 mg by mouth at bedtime.      Marland Kitchen zolpidem (AMBIEN) 10 MG tablet Take 10 mg by mouth at bedtime as needed for sleep.       No current facility-administered medications for this visit.   REVIEW OF SYSTEMS: Valu.Nieves ] denotes positive finding; [  ] denotes negative finding CARDIOVASCULAR:  [ ]  chest pain   [ ]  chest pressure   [ ]  palpitations   [ ]  orthopnea   [ ]  dyspnea on exertion   [ ]  claudication   [ ]  rest pain   [ ]  DVT   [ ]  phlebitis PULMONARY:   [ ]  productive cough   [ ]  asthma   [ ]  wheezing NEUROLOGIC:   [ ]  weakness  [ ]  paresthesias  [ ]  aphasia  [ ]  amaurosis  [ ]  dizziness HEMATOLOGIC:   [ ]  bleeding problems   [ ]  clotting disorders MUSCULOSKELETAL:  [ ]  joint pain   [ ]  joint swelling [ ]  leg swelling GASTROINTESTINAL: [ ]   blood in stool  [ ]   hematemesis GENITOURINARY:  [ ]   dysuria  [ ]   hematuria PSYCHIATRIC:  [ ]  history of major depression INTEGUMENTARY:  [ ]  rashes  [ ]  ulcers CONSTITUTIONAL:  [ ]  fever   [ ]  chills  PHYSICAL EXAM: Filed Vitals:   06/27/12 1613  BP: 156/68  Pulse: 81  Temp: 99.3 F (37.4 C)  TempSrc: Oral  Height: 5\' 11"  (1.803 m)  Weight: 275 lb (124.739 kg)  SpO2: 99%   Body mass index is 38.37 kg/(m^2). GENERAL: The patient is a well-nourished male, in no acute distress. The vital signs are documented above. CARDIOVASCULAR: There is a regular rate and rhythm.  PULMONARY: There is good air exchange bilaterally without wheezing or rales. ABDOMEN: Soft and non-tender with normal pitched bowel sounds.  MUSCULOSKELETAL: There are no major deformities or cyanosis. NEUROLOGIC: No focal weakness or paresthesias are detected. SKIN: There are no ulcers or rashes noted. PSYCHIATRIC: The patient has a normal affect. His left thigh AV graft has an excellent bruit  and thrill. At the 3:00 position, there is a pulsatile clot there is an approximately 1 cm in diameter. This has eroded through the skin. At the 7:00 position there is an eschar. There is no surrounding erythema or drainage.  DATA:  His labs are pending.   MEDICAL ISSUES: This patient has a pseudoaneurysm at the 3:00 position of his left thigh AV graft. There is what appears to be laminated clot which has eroded through the skin. This is pulsatile. I think that this is at significant risk for bleeding. I am sending him to [facility] urgently for repair of this pseudoaneurysm. He states that he has not had anything to eat or drink this morning. He also has some eschar to 7:00 position which may also require exploration. I have discussed the case with Dr. Bridgett Larsson who is on call.  [place] [facility] [place] [facility]per: [phone]

## 2012-06-27 NOTE — Preoperative (Signed)
Beta Blockers   Reason not to administer Beta Blockers:Not Applicable 

## 2012-06-27 NOTE — Interval H&P Note (Signed)
[facility] [place]  History and Physical Update  The patient was interviewed and re-examined.  The patient's previous History and Physical has been reviewed and is unchanged from Dr. Nicole Cella consult on: 06/27/12.  There is no change in the plan of care: revision of Left thigh AVG.  Adele Barthel, MD [facility] [place] Office: [phone] Pager: [phone]  06/27/2012, 5:49 PM

## 2012-06-27 NOTE — Anesthesia Postprocedure Evaluation (Signed)
Anesthesia Post Note  Patient: [name]  Procedure(s) Performed: Procedure(s) (LRB): EXPLORATORY LEFT THY-GRAFT PSEUDO-ANEURYSM (Left)  Anesthesia type: general  Patient location: PACU  Post pain: Pain level controlled  Post assessment: Patient's Cardiovascular Status Stable  Last Vitals:  Filed Vitals:   06/27/12 2259  BP: 136/77  Pulse: 70  Temp: 37.1 C  Resp: 14    Post vital signs: Reviewed and stable  Level of consciousness: sedated  Complications: No apparent anesthesia complications

## 2012-06-27 NOTE — Anesthesia Preprocedure Evaluation (Addendum)
Anesthesia Evaluation    Airway       Dental   Pulmonary neg pulmonary ROS,          Cardiovascular negative cardio ROS   '10 ECHO: mildly reduced LVF '12 EHO: normal LVF, EF 60-65%, mitral calcification with trivial stenosis   Neuro/Psych Seizures -,  Anxiety Spina bifida    GI/Hepatic GERD-  ,  Endo/Other  Hypothyroidism Morbid obesity  Renal/GU Dialysis and ESRFRenal disease     Musculoskeletal   Abdominal   Peds  Hematology negative hematology ROS (+)   Anesthesia Other Findings   Reproductive/Obstetrics                        Anesthesia Physical Anesthesia Plan  ASA: III and emergent  Anesthesia Plan: General   Post-op Pain Management:    Induction: Intravenous  Airway Management Planned: LMA  Additional Equipment:   Intra-op Plan:   Post-operative Plan: Extubation in OR  Informed Consent:   Plan Discussed with: CRNA, Anesthesiologist and Surgeon  Anesthesia Plan Comments:         Anesthesia Quick Evaluation

## 2012-06-27 NOTE — Progress Notes (Signed)
[facility]  Patient name: [name] MRN: [id number] DOB: [birth year] Sex: male  REASON FOR ADMISSION: Pseudoaneurysm of left thigh AV graft at risk for hemorrhage. Referred by Dr. Precious Bard.  HPI: [name] is a 47 y.o. male states that he has had a left thigh AV graft for several years. He developed a pulsatile area over the lateral aspect of his graft approximately a month ago. Dr. Mercy Moore was concerned about this area and set him up for an off this visit. He states that recently the skin overlying this area peeled off. He denies any fever or chills. He dialyzes Monday Wednesdays and Fridays at [facility].  Of note, he had a similar problem in the past and apparently a pseudoaneurysm ruptured when he was in the emergency department and he coded. He is also witnessed someone else have a significant bleeding problems from the graft and is very afraid that his current wounds her risk for bleeding.  Past Medical History  Diagnosis Date  . End stage renal failure on dialysis   . Spina bifida   . Seizures     last 1998  . GERD (gastroesophageal reflux disease)   . Insomnia   . Anxiety    FAMILY HISTORY: There is no history of premature cardiovascular disease.  SOCIAL HISTORY: History  Substance Use Topics  . Smoking status: Never Smoker   . Smokeless tobacco: Never Used  . Alcohol Use: No   Allergies  Allergen Reactions  . Gabapentin     unknown  . Sulfa Antibiotics     unknown   Current Outpatient Prescriptions  Medication Sig Dispense Refill  . aspirin 325 MG tablet Take 325 mg by mouth daily.      . calcium acetate, Phos Binder, (PHOSLYRA) 667 MG/5ML SOLN Take 667 mg by mouth 3 (three) times daily with meals.      . famotidine (PEPCID) 40 MG tablet Take 40 mg by mouth daily.      . furosemide (LASIX) 80 MG tablet Take 80 mg by mouth 2 (two) times daily.      Marland Kitchen levothyroxine (SYNTHROID, LEVOTHROID) 125 MCG tablet Take 125 mcg  by mouth daily before breakfast.      . promethazine (PHENERGAN) 25 MG tablet Take 25 mg by mouth every 6 (six) hours as needed for nausea.      . traZODone (DESYREL) 100 MG tablet Take 100 mg by mouth at bedtime.      Marland Kitchen zolpidem (AMBIEN) 10 MG tablet Take 10 mg by mouth at bedtime as needed for sleep.       No current facility-administered medications for this visit.   REVIEW OF SYSTEMS: Valu.Nieves ] denotes positive finding; [  ] denotes negative finding CARDIOVASCULAR:  [ ]  chest pain   [ ]  chest pressure   [ ]  palpitations   [ ]  orthopnea   [ ]  dyspnea on exertion   [ ]  claudication   [ ]  rest pain   [ ]  DVT   [ ]  phlebitis PULMONARY:   [ ]  productive cough   [ ]  asthma   [ ]  wheezing NEUROLOGIC:   [ ]  weakness  [ ]  paresthesias  [ ]  aphasia  [ ]  amaurosis  [ ]  dizziness HEMATOLOGIC:   [ ]  bleeding problems   [ ]  clotting disorders MUSCULOSKELETAL:  [ ]  joint pain   [ ]  joint swelling [ ]  leg swelling GASTROINTESTINAL: [ ]   blood in stool  [ ]   hematemesis GENITOURINARY:  [ ]   dysuria  [ ]   hematuria PSYCHIATRIC:  [ ]  history of major depression INTEGUMENTARY:  [ ]  rashes  [ ]  ulcers CONSTITUTIONAL:  [ ]  fever   [ ]  chills  PHYSICAL EXAM: Filed Vitals:   06/27/12 1613  BP: 156/68  Pulse: 81  Temp: 99.3 F (37.4 C)  TempSrc: Oral  Height: 5\' 11"  (1.803 m)  Weight: 275 lb (124.739 kg)  SpO2: 99%   Body mass index is 38.37 kg/(m^2). GENERAL: The patient is a well-nourished male, in no acute distress. The vital signs are documented above. CARDIOVASCULAR: There is a regular rate and rhythm.  PULMONARY: There is good air exchange bilaterally without wheezing or rales. ABDOMEN: Soft and non-tender with normal pitched bowel sounds.  MUSCULOSKELETAL: There are no major deformities or cyanosis. NEUROLOGIC: No focal weakness or paresthesias are detected. SKIN: There are no ulcers or rashes noted. PSYCHIATRIC: The patient has a normal affect. His left thigh AV graft has an excellent bruit  and thrill. At the 3:00 position, there is a pulsatile clot there is an approximately 1 cm in diameter. This has eroded through the skin. At the 7:00 position there is an eschar. There is no surrounding erythema or drainage.  DATA:  His labs are pending.   MEDICAL ISSUES: This patient has a pseudoaneurysm at the 3:00 position of his left thigh AV graft. There is what appears to be laminated clot which has eroded through the skin. This is pulsatile. I think that this is at significant risk for bleeding. I am sending him to [place] urgently for repair of this pseudoaneurysm. He states that he has not had anything to eat or drink this morning. He also has some eschar to 7:00 position which may also require exploration. I have discussed the case with Dr. Bridgett Larsson who is on call.  [place] [facility] [place] Beeper: [phone]

## 2012-06-27 NOTE — Transfer of Care (Signed)
Immediate Anesthesia Transfer of Care Note  Patient: [name]  Procedure(s) Performed: Procedure(s) with comments: EXPLORATORY LEFT THY-GRAFT PSEUDO-ANEURYSM (Left) - Revision of left Arteriovenus gortex graft in thigh.  Patient Location: PACU  Anesthesia Type:General  Level of Consciousness: awake, alert , oriented and patient cooperative  Airway & Oxygen Therapy: Patient Spontanous Breathing  Post-op Assessment: Report given to PACU RN and Post -op Vital signs reviewed and stable  Post vital signs: Reviewed and stable  Complications: No apparent anesthesia complications

## 2012-06-27 NOTE — Op Note (Signed)
OPERATIVE NOTE   PROCEDURE: 1. Revision of thigh arteriovenous graft with interposition graft 2. Excision of eroded graft segments x 2  PRE-OPERATIVE DIAGNOSIS: Imminent rupture of left thigh arteriovenous graft   POST-OPERATIVE DIAGNOSIS: same as above   SURGEON: Adele Barthel, MD  ASSISTANT(S): Wray Kearns, [facility]   ANESTHESIA: general  ESTIMATED BLOOD LOSS: 250 cc  FINDING(S): 1. One eroded segment of graft over each arterial and venous arm of thigh arteriovenous graft  2. No evidence of infection 3. Thrill in thigh arteriovenous graft at the end of the case  SPECIMEN(S):  Anaerobic and aerobic segment of graft  INDICATIONS:   [name] is a 47 y.o. male who presents with two segments of thigh graft with erosion with imminent rupture.  I discussed with the patient proceeding with an exploration of the left thigh arteriovenous graft and excision of the two likely eroded segments with reconstruction of the thigh arteriovenous graft as needed.  Risk, benefits, and alternatives to access surgery were discussed.  The patient is aware the risks include but are not limited to: bleeding, infection, steal syndrome, nerve damage, infected graft, need for additional procedures, death and stroke.  The patient agrees to proceed forward with the procedure.   DESCRIPTION: After obtaining full informed written consent, the patient was brought back to the operating room and placed supine upon the operating table.  The patient received IV antibiotics prior to induction.  After obtaining adequate anesthesia, the patient was prepped and draped in the standard fashion for: a left thigh arteriovenous graft.  I made an incision over the arteriovenous graft over the arterial arm of the graft.  In this process, I found a pseudoaneurysm over this segment which required a proximal dissection.  Eventually, I dissected out, with some difficult, the thigh arteriovenous graft proximal the the erosion over  the arterial arm of the graft.  I then explored the scab over the erosion over the venous arm and immediately there was pulsatile bleeding, proving there was a rupture here.  I held pressure to the rupture and gave the patient 9000 units of Heparin which was a therapeutic bolus.  In total, I gave 11000 units of Heparin to maintain anticoagulation.  After 3 minutes, I clamped the arteriovenous graft in the arterial limb exposure.  There continued to be bleeding so I oversewed the ruptured portion of graft with a 3-0 Prolene.  I then made an incision over the venous limb of the graft and dissected out this segment with difficulty again due to the well incorporation of the graft.  Eventually, I had a segment of venous limb of the thigh arteriovenous graft dissected proximal to the venous erosion.  I clamped the graft in this exposure.  I then transected the thigh arteriovenous graft in each incision and excised the extra length of graft in each incision.  I then made an elliptical incision over the venous erosion and dissected through the skin and subcutaneous tissue with electrocautery.  I also removed the segment of ruptured graft here and passed it off the field as a specimen.  I oversewed the end of the residual graft with a 5-0 Prolene.  In a similar fashion, I removed an ellipse of skin over the arterial limb rupture and excised the ruptured segment of graft.  I oversewed the residual graft with a 5-0 Prolene also.  Both erode graft wounds were reapproximated in the deep tissue with 3-0 Vicryl horizontal mattress stitches.  The superifical subcutaneous tissue in each wound  was reapproximated with a running 3-0 Vicryl stitch.  The skin at both erosion incision was closed with staples.    Now that the erosion incisions were closed, I dissected a curved tunnel INSIDE the prior loop of arteriovenous graft from the arterial arm incision to the venous arm incision.  I passed a 6 mm Goretex graft through the metal  tunnel, pulling out the tunnel and leaving the graft in place.  I turned my attention to the arterial limb exposure.  I spatulated the old thigh arteriovenous graft and the new graft to facilitate an end-to-end anastomosis.  The new and old graft were sewn together with CV-5 in an end-to-end configuration.  I then pulled the graft to tension in the new tunnel.  I turned my attention to the venous limb exposure.  I spatulated the old thigh arteriovenous graft and the new graft to facilitate an end-to-end anastomosis.  The new and old graft were sewn together with CV-5 in an end-to-end configuration.  Prior to completing this anastomosis, I allowed the graft to bleed in antegrade fashion.  There was good pulsatile bleeding.  There was only limited backbleeding from the venous limb, so I passed a 4 Fogarty up the venous outflow.  At 15-20 cm there is a calcified lesion, as the first Fogarty ruptured on this lesion.  With a new 4 Fogarty, I got good vigorous venous backbleeding without any evidence of thrombus.  The anastomosis was completed in the usual fashion.   I gave 30 mg of Protamine to reverse anticoagulation.  I then placed thrombin and gelfoam into all incision.  After waiting several minutes, the gelfoam was removed and no further active bleeding was noted.  Each the graft exposures were closed with a deep layer of 2-0 Vicryl, a superficial layer of 3-0 Vicryl, and the skin was closed with staples.  Sterile dressing were applied to all incisions.    I marked on the patient's leg, the residual segments of arteriovenous graft that can be cannulated.  If they have problems with using these segments, placement of a tunneled dialysis catheter will be needed.  COMPLICATIONS: none  CONDITION: stable  Adele Barthel, MD [facility] [place] Office: [phone] Pager: [phone]  06/27/2012, 9:35 PM

## 2012-06-28 ENCOUNTER — Encounter (HOSPITAL_COMMUNITY): Payer: Self-pay | Admitting: Vascular Surgery

## 2012-06-28 ENCOUNTER — Telehealth: Payer: Self-pay | Admitting: Vascular Surgery

## 2012-06-28 LAB — BASIC METABOLIC PANEL
BUN: 31 mg/dL — ABNORMAL HIGH (ref 6–23)
CO2: 31 mEq/L (ref 19–32)
Calcium: 9.2 mg/dL (ref 8.4–10.5)
Chloride: 96 mEq/L (ref 96–112)
Creatinine, Ser: 7.95 mg/dL — ABNORMAL HIGH (ref 0.50–1.35)
GFR calc Af Amer: 8 mL/min — ABNORMAL LOW (ref >90)
GFR calc non Af Amer: 7 mL/min — ABNORMAL LOW (ref >90)
Glucose, Bld: 89 mg/dL (ref 70–99)
Potassium: 5.1 mEq/L (ref 3.5–5.1)
Sodium: 137 mEq/L (ref 135–145)

## 2012-06-28 MED ORDER — OXYCODONE-ACETAMINOPHEN 5-325 MG PO TABS
ORAL_TABLET | ORAL | Status: AC
  Administered 2012-06-28: 1 via ORAL
  Filled 2012-06-28: qty 1

## 2012-06-28 MED ORDER — DARBEPOETIN ALFA-POLYSORBATE 100 MCG/0.5ML IJ SOLN
100.0000 ug | INTRAMUSCULAR | Status: DC
  Filled 2012-06-28: qty 0.5

## 2012-06-28 MED ORDER — CALCIUM ACETATE 667 MG PO CAPS
2668.0000 mg | ORAL_CAPSULE | Freq: Three times a day (TID) | ORAL | Status: DC
  Administered 2012-06-28: 2668 mg via ORAL
  Filled 2012-06-28 (×4): qty 4

## 2012-06-28 MED ORDER — DARBEPOETIN ALFA-POLYSORBATE 100 MCG/0.5ML IJ SOLN
INTRAMUSCULAR | Status: AC
  Administered 2012-06-28: 100 ug via INTRAVENOUS
  Filled 2012-06-28: qty 0.5

## 2012-06-28 MED ORDER — CALCIUM ACETATE 667 MG PO CAPS
667.0000 mg | ORAL_CAPSULE | Freq: Three times a day (TID) | ORAL | Status: DC
  Filled 2012-06-28 (×4): qty 1

## 2012-06-28 MED ORDER — OXYCODONE HCL 5 MG PO TABS
ORAL_TABLET | ORAL | Status: AC
  Administered 2012-06-28: 5 mg via ORAL
  Filled 2012-06-28: qty 1

## 2012-06-28 NOTE — Procedures (Signed)
Pt seen on HD  No SOB AP 100 Vp 210.  BFR 400.  Anticipate DC after HD.

## 2012-06-28 NOTE — Progress Notes (Signed)
Pt after visit summary reviewed and pt capable of re verbalizing medications and follow up appointments. Pt educated to call and reschedule follow up appointment as it is scheduled on a  HD day.Pt remains stable. No signs and symptoms of distress. Educated to return to ER in the event of SOB, dizziness, chest pain, or fainting or signs and symptoms of infection at surgical site. Mady Gemma, RN

## 2012-06-28 NOTE — Progress Notes (Signed)
Pt admitted to the unit from [facility]. Pt is alert and oriented. Pt oriented to room, staff, and call bell. Bed in lowest position. Full assessment to Epic. Call bell with in reach. Told to call for assists. Will continue to monitor. Mady Gemma, RN

## 2012-06-28 NOTE — Progress Notes (Signed)
Notified patient ride, [name], to inform that pt is ready to be picked up. Pt then noted to have bleeding present from surgical site. Gauze to site, telephoned rapid response [name] for second assess, Notified surgery ( Dr. Trula Keith with call back- ok to send home as long as bleeding had stopped). Bleeding stopped. Staples remain intact and well approximated. New dressing applied. Educated pt monitor for any further signs of bleeding and to return in the event it restarted. Pt assisted to [facility] for discharge as planned. [name]

## 2012-06-28 NOTE — Telephone Encounter (Signed)
Lvm, sent letter - kf

## 2012-06-28 NOTE — Telephone Encounter (Signed)
Message copied by Berniece Salines on Fri Jun 28, 2012  9:34 AM ------      Message from: Richrd Prime      Created: Thu Jun 27, 2012  9:48 PM       2 week F/U right thigh graft revision-chen ------

## 2012-06-28 NOTE — Progress Notes (Signed)
[facility] [place]  Daily Progress Note  Assessment/Planning: POD #1 s/p Revision of thigh arteriovenous graft with interposition graft, Excision of eroded graft segments x 2   Patent left thigh AVG without any steal sx  Would attempt cannulation of the thigh graft while in the hospital.  If problematic, may need to place tunneled dialysis catheter.  D/C later today  Subjective  - 1 Day Post-Op  No complaints  Objective Filed Vitals:   06/27/12 2215 06/27/12 2230 06/27/12 2259 06/28/12 0519  BP: 113/59 117/60 136/77 104/51  Pulse: 68 68 70 72  Temp: 97.9 F (36.6 C)  98.7 F (37.1 C) 98.6 F (37 C)  TempSrc:   Oral Oral  Resp: 14 12 14 16   Height:   5\' 11"  (1.803 m)   Weight:   280 lb 6.8 oz (127.2 kg)   SpO2: 92% 94% 96% 98%    Intake/Output Summary (Last 24 hours) at 06/28/12 0741 Last data filed at 06/28/12 0500  Gross per 24 hour  Intake   1170 ml  Output     20 ml  Net   1150 ml    PULM  CTAB CV  RRR GI  soft, NTND VASC  L thigh incisions bandaged, palpable thrill with bruit in L thigh AVG  Laboratory CBC    Component Value Date/Time   WBC 6.6 07/31/2010 0410   HGB 11.2* 06/27/2012 1846   HCT 33.0* 06/27/2012 1846   PLT 128* 07/31/2010 0410    BMET    Component Value Date/Time   NA 139 06/27/2012 1846   K 4.7 06/27/2012 1846   CL 93* 07/31/2010 0410   CO2 29 07/31/2010 0410   GLUCOSE 88 06/27/2012 1846   BUN 35* 07/31/2010 0410   CREATININE 7.92* 07/31/2010 0410   CALCIUM 10.4 07/31/2010 0410   GFRNONAA 7* 07/31/2010 0410   GFRAA 9* 07/31/2010 0410    Adele Barthel, MD [facility] [place] Office: [phone] Pager: [phone]  06/28/2012, 7:41 AM

## 2012-06-28 NOTE — Progress Notes (Signed)
UR Chart Review Completed  

## 2012-06-30 LAB — WOUND CULTURE: Gram Stain: NONE SEEN

## 2012-07-01 NOTE — Discharge Summary (Signed)
[facility] Discharge Summary   Patient ID:  [name] MRN: [id number] DOB/AGE: 1965/10/03 47 y.o.  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 End stage renal failure on dialysis   . Spina bifida   . Seizures     last 1998  . GERD (gastroesophageal reflux disease)   . Insomnia   . Anxiety   . Hypothyroidism   . Anemia   . Headache(784.0)     Procedure(s): 1. EXPLORATION LEFT THigh-GRAFT  2. Revision of thigh arteriovenous graft with interposition graft 3. Excision of eroded graft segments x 2  Discharged Condition: good  HPI:  [name] is a 47 y.o. male states that he has had a left thigh AV graft for several years. He developed a pulsatile area over the lateral aspect of his graft approximately a month ago. Dr. Mercy Moore was concerned about this area and set him up for an off this visit. He states that recently the skin overlying this area peeled off. He denies any fever or chills. He dialyzes Monday Wednesdays and Fridays at [facility].  Of note, he had a similar problem in the past and apparently a pseudoaneurysm ruptured when he was in the emergency department and he coded. He is also witnessed someone else have a significant bleeding problems from the graft and is very afraid that his current wounds her risk for bleeding   Hospital Course:  [name] is a 47 y.o. male is S/P 4. EXPLORATION LEFT THigh-GRAFT  5. Revision of thigh arteriovenous graft with interposition graft 6. Excision of eroded graft segments x 2   Extubated: POD # 0 Physical exam: left thigh wounds healing well Good thrill and bruit in graft BLE with  good sensation and motion - no signs of steel Post-op wounds healing well Pt. Ambulating, voiding and taking PO diet without difficulty. Pt pain controlled with PO pain meds. Labs as below Complications:none - HD was achieved through the remaining portion of the right thigh graft without difficulty  Consults:  Treatment Team:  Louis Meckel, MD  Significant Diagnostic Studies: CBC Lab Results  Component Value Date   WBC 6.6 07/31/2010   HGB 11.2* 06/27/2012   HCT 33.0* 06/27/2012   MCV 92.1 07/31/2010   PLT 128* 07/31/2010    BMET    Component Value Date/Time   NA 137 06/28/2012 0630   K 5.1 06/28/2012 0630   CL 96 06/28/2012 0630   CO2 31 06/28/2012 0630   GLUCOSE 89 06/28/2012 0630   BUN 31* 06/28/2012 0630   CREATININE 7.95* 06/28/2012 0630   CALCIUM 9.2 06/28/2012 0630   GFRNONAA 7* 06/28/2012 0630   GFRAA 8* 06/28/2012 0630   COAG Lab Results  Component Value Date   INR 1.1 08/23/2008     Disposition:  Discharge to :Home Discharge Orders   Future Appointments Provider Department Dept Phone   07/12/2012 11:30 AM Conrad [place], MD [facility] [phone]   Future Orders Complete By Expires     Call MD for:  redness, tenderness, or signs of infection (pain, swelling, bleeding, redness, odor or green/yellow discharge around incision site)  As directed     Call MD for:  severe or increased pain, loss or  decreased feeling  in affected limb(s)  As directed     Call MD for:  temperature >100.5  As directed     Change dressing (specify)  As directed     Comments:      Dressing change: as needed    Driving Restrictions  As directed     Comments:      No driving for 1 week and off pain medication    Increase activity slowly  As directed     Comments:      Walk with assistance use walker or cane as needed    May shower   As directed     Scheduling Instructions:      Saturday    Resume previous diet  As directed     may wash over wound  with mild soap and water  As directed         Medication List         aspirin 325 MG tablet  Take 325 mg by mouth daily.     ASTELIN 137 MCG/SPRAY nasal spray  Generic drug:  azelastine  Place 1 spray into the nose 2 (two) times daily. Use in each nostril as directed     calcium acetate (Phos Binder) 667 MG/5ML Soln  Commonly known as:  PHOSLYRA  Take 667 mg by mouth 3 (three) times daily with meals.     calcium acetate 667 MG capsule  Commonly known as:  PHOSLO  Take 1,334-2,668 mg by mouth 3 (three) times daily with meals. 4 capsules with meals, 2 with snacks     famotidine 40 MG tablet  Commonly known as:  PEPCID  Take 40 mg by mouth daily.     fluticasone 50 MCG/ACT nasal spray  Commonly known as:  FLONASE  Place 2 sprays into the nose daily.     furosemide 80 MG tablet  Commonly known as:  LASIX  Take 80 mg by mouth 2 (two) times daily.     levothyroxine 125 MCG tablet  Commonly known as:  SYNTHROID, LEVOTHROID  Take 125 mcg by mouth daily before breakfast.     LORazepam 0.5 MG tablet  Commonly known as:  ATIVAN  Take 0.5 mg by mouth daily.     oxyCODONE-acetaminophen 10-325 MG per tablet  Commonly known as:  PERCOCET  Take 1 tablet by mouth every 4 (four) hours as needed for pain.     promethazine 25 MG tablet  Commonly known as:  PHENERGAN  Take 25 mg by mouth every 6 (six) hours as needed for nausea.     SENSIPAR PO  Take by mouth.     sodium polystyrene powder  Commonly known as:  KAYEXALATE  Take 15 g by mouth 2 (two) times a week. Saturday and Sunday     traZODone 100 MG tablet  Commonly known as:  DESYREL  Take 100 mg by mouth at bedtime.     zolpidem 10 MG tablet  Commonly known as:  AMBIEN  Take 10 mg by mouth at bedtime as needed for sleep.       Verbal and written Discharge instructions given to the patient. Wound care per Discharge AVS     Follow-up Information   Follow up with Hinda Lenis, MD In 2 weeks. (office will  arrange - sent)    Contact information:   [place] [address] [phone]       Signed: Richrd Prime 07/01/2012, 10:04 AM  Addendum  I have  independently interviewed and examined the patient, and I agree with the physician assistant's discharge summary.  Pt was admitted after an exploration of his left thigh AVG which was found to have two controlled ruptures of the thigh AVG.  The ruptured segments were excised and new thigh AVG segment was placed.  He was admitted overnight due to the late time of the case and to allow attempt at cannulation.  He will be continued on antibiotics while on hemodialysis.  He will continue hemodialysis via the old segments of the left femoral thigh AVG while allowing the new segments to heal in.  Adele Barthel, MD [facility] [place] Office: [phone] Pager: [phone]  07/08/2012, 7:38 AM

## 2012-07-02 LAB — ANAEROBIC CULTURE: Gram Stain: NONE SEEN

## 2012-07-11 ENCOUNTER — Encounter: Payer: Self-pay | Admitting: Vascular Surgery

## 2012-07-12 ENCOUNTER — Ambulatory Visit: Payer: Medicare Other | Admitting: Vascular Surgery

## 2012-07-12 ENCOUNTER — Ambulatory Visit (INDEPENDENT_AMBULATORY_CARE_PROVIDER_SITE_OTHER): Payer: Medicare Other | Admitting: Vascular Surgery

## 2012-07-12 ENCOUNTER — Encounter: Payer: Self-pay | Admitting: Vascular Surgery

## 2012-07-12 VITALS — BP 141/72 | HR 68 | Ht 71.0 in | Wt 284.0 lb

## 2012-07-12 DIAGNOSIS — N186 End stage renal disease: Secondary | ICD-10-CM

## 2012-07-12 NOTE — Progress Notes (Signed)
[facility] [place]  Postoperative Access Visit  History of Present Illness  [name] is a 47 y.o. year old male who presents for postoperative follow-up for: L thigh AVG revision for erosion, excision of erode segment x 2 (Date: 06/27/12).  The patient's wounds are healed.  The patient notes no steal symptoms.  The patient is able to complete their activities of daily living.  The patient's current symptoms are: none.  The dialysis center has been cannulating the new segment of the graft so far with any complications.  For VQI Use Only  PRE-ADM LIVING: Home  AMB STATUS: Ambulatory  Physical Examination Filed Vitals:   07/12/12 1317  BP: 141/72  Pulse: 68    LLE: Incisions are healed, staples still in place, skin feels warm, hand grip is 5/5, sensation in digits is intact, palpable thrill, bruit can be auscultated   Medical Decision Making  Zoran Scheid is a 47 y.o. year old male who presents s/p L thigh AVG revision for erosions x 2.  The patient's access is being inappropriately early cannulated in the new segment, but no complications seem evident on exam.  Half of staples were removed today and half will be removed next week.  At this point, I would clear the patient cannulation at the next segments.  Thank you for allowing [place] to participate in this patient's care.  Adele Barthel, MD [facility] [place] Office: [phone] Pager: [phone]  07/12/2012, 6:54 PM

## 2012-07-19 ENCOUNTER — Ambulatory Visit (INDEPENDENT_AMBULATORY_CARE_PROVIDER_SITE_OTHER): Payer: Medicare Other | Admitting: Vascular Surgery

## 2012-07-19 VITALS — BP 140/80 | HR 72 | Temp 99.2°F | Resp 16 | Ht 71.0 in | Wt 284.0 lb

## 2012-07-19 DIAGNOSIS — N186 End stage renal disease: Secondary | ICD-10-CM

## 2012-07-19 DIAGNOSIS — Z4802 Encounter for removal of sutures: Secondary | ICD-10-CM | POA: Insufficient documentation

## 2012-07-19 NOTE — Progress Notes (Signed)
[facility] [place]  Postoperative Access Visit  History of Present Illness  [name] is a 47 y.o. year old male who presents for postoperative follow-up for: L thigh AVG revision for erosion, excision of erode segment x 2 (Date: 06/27/12). The patient's wounds are healing. The patient notes no steal symptoms. The patient is able to complete their activities of daily living. The patient's current symptoms are: none.   For VQI Use Only  PRE-ADM LIVING: Home  AMB STATUS: Ambulatory   Physical Examination  Filed Vitals:   07/19/12 1543  BP: 140/80  Pulse: 72  Temp: 99.2 F (37.3 C)  TempSrc: Oral  Resp: 16  Height: 5\' 11"  (1.803 m)  Weight: 284 lb (128.822 kg)  SpO2: 94%   LLE: Incisions are healed, remaining staples removed, skin feels warm, hand grip is 5/5, sensation in digits is intact, palpable thrill, bruit can be auscultated   Medical Decision Making  [name] is a 47 y.o. year old male who presents s/p L thigh AVG revision for erosions x 2.  The left thigh AVG has been cannulated multiple times without difficulty, so I would allow full use of the AVG. The AVG is not underlying any previously stapled segments, so there is no reason to cannulate such. Thank you for allowing [place] to participate in this patient's care.  Adele Barthel, MD  [facility] [place]  Office: [phone]  Pager: [phone]  07/19/2012, 5:25 PM

## 2012-09-23 ENCOUNTER — Telehealth: Payer: Self-pay | Admitting: *Deleted

## 2012-09-23 ENCOUNTER — Ambulatory Visit: Payer: Medicare Other | Admitting: Surgery

## 2012-09-23 NOTE — Telephone Encounter (Signed)
[name] called to say the [facility] is reporting that [name] has a red area over thigh graft with a yellow center. There is no drainage or heat in the area; but he would like him seen [id number] infection. I made him an appt for today with Dr Trula Slade at 3:15.

## 2012-10-07 ENCOUNTER — Encounter: Payer: Self-pay | Admitting: Vascular Surgery

## 2012-10-08 ENCOUNTER — Encounter: Payer: Self-pay | Admitting: Vascular Surgery

## 2012-10-08 ENCOUNTER — Ambulatory Visit (INDEPENDENT_AMBULATORY_CARE_PROVIDER_SITE_OTHER): Payer: Medicare Other | Admitting: Vascular Surgery

## 2012-10-08 ENCOUNTER — Other Ambulatory Visit: Payer: Self-pay

## 2012-10-08 VITALS — BP 153/75 | HR 65 | Ht 71.0 in | Wt 284.0 lb

## 2012-10-08 DIAGNOSIS — T82898A Other specified complication of vascular prosthetic devices, implants and grafts, initial encounter: Secondary | ICD-10-CM

## 2012-10-08 NOTE — Progress Notes (Signed)
The patient presents today for continued followup of access issues in his left femoral loop AV Gore-Tex graft. Most recently he had removal of a segment of eroded graft by Dr. Bridgett Larsson on 06/27/2012. He is using the new segment of graft in his left thigh. His prior incision on the arterial limb of the graft has continued to not heal and does have extensive granulation tissue and the patient tells me that this has cultured staff and he is on antibiotics for this.  On physical exam the patient does have an area approximately 2-3 cm opened with a granulating tissue which is healing very poorly. There is no exposed graft I suspect this is the base of this wound. There is mild surrounding erythema  Impression and plan: Continued issue with his left femoral loop graft. He will require excision of the involved area. He is questioning the ability to completely replace his left femoral graft. I explained that I would reserve this for more  Involvement. He understands that these are a continued attempts to salvage this. He has not had a right femoral loop graft and I agree with the patient that we need to stay he clear this area as long as possible. He does dialyze on Monday Wednesday and Friday and wishes that I do the surgery. I will remove his dialysis today so that he can have surgery on 10:15 as an outpatient at [facility]. I did explain possibility of recurrent graft infection since he does have an infected site. He will will continue antibiotics with his dialysis treatment until surgery.

## 2012-10-15 ENCOUNTER — Encounter (HOSPITAL_COMMUNITY): Payer: Self-pay | Admitting: *Deleted

## 2012-10-15 MED ORDER — DEXTROSE 5 % IV SOLN
1.5000 g | INTRAVENOUS | Status: AC
  Administered 2012-10-16: 1.5 g via INTRAVENOUS
  Filled 2012-10-15: qty 1.5

## 2012-10-16 ENCOUNTER — Encounter (HOSPITAL_COMMUNITY): Payer: Medicare Other | Admitting: Certified Registered Nurse Anesthetist

## 2012-10-16 ENCOUNTER — Encounter (HOSPITAL_COMMUNITY)
Admission: RE | Disposition: A | Discharge status: Home / Self Care | Payer: Self-pay | Source: Ambulatory Visit | Attending: Vascular Surgery

## 2012-10-16 ENCOUNTER — Ambulatory Visit (HOSPITAL_COMMUNITY): Payer: Medicare Other | Admitting: Certified Registered Nurse Anesthetist

## 2012-10-16 ENCOUNTER — Observation Stay (HOSPITAL_COMMUNITY)
Admission: RE | Admit: 2012-10-16 | Discharge: 2012-10-17 | Disposition: A | Discharge status: Home / Self Care | Payer: Medicare Other | Source: Ambulatory Visit | Attending: Vascular Surgery | Admitting: Vascular Surgery

## 2012-10-16 ENCOUNTER — Encounter (HOSPITAL_COMMUNITY): Payer: Self-pay | Admitting: Certified Registered Nurse Anesthetist

## 2012-10-16 DIAGNOSIS — Z936 Other artificial openings of urinary tract status: Secondary | ICD-10-CM | POA: Insufficient documentation

## 2012-10-16 DIAGNOSIS — T82898A Other specified complication of vascular prosthetic devices, implants and grafts, initial encounter: Principal | ICD-10-CM | POA: Insufficient documentation

## 2012-10-16 DIAGNOSIS — Z79899 Other long term (current) drug therapy: Secondary | ICD-10-CM | POA: Insufficient documentation

## 2012-10-16 DIAGNOSIS — Z23 Encounter for immunization: Secondary | ICD-10-CM | POA: Insufficient documentation

## 2012-10-16 DIAGNOSIS — Z992 Dependence on renal dialysis: Secondary | ICD-10-CM | POA: Insufficient documentation

## 2012-10-16 DIAGNOSIS — Y832 Surgical operation with anastomosis, bypass or graft as the cause of abnormal reaction of the patient, or of later complication, without mention of misadventure at the time of the procedure: Secondary | ICD-10-CM | POA: Insufficient documentation

## 2012-10-16 DIAGNOSIS — N186 End stage renal disease: Secondary | ICD-10-CM | POA: Insufficient documentation

## 2012-10-16 DIAGNOSIS — Q059 Spina bifida, unspecified: Secondary | ICD-10-CM | POA: Insufficient documentation

## 2012-10-16 HISTORY — PX: INCISION AND DRAINAGE: SHX5863

## 2012-10-16 HISTORY — PX: ARTERIOVENOUS GRAFT PLACEMENT: SUR1029

## 2012-10-16 HISTORY — DX: Shortness of breath: R06.02

## 2012-10-16 HISTORY — PX: REVISION OF ARTERIOVENOUS GORETEX GRAFT: SHX6073

## 2012-10-16 LAB — POCT I-STAT 4, (NA,K, GLUC, HGB,HCT)
Glucose, Bld: 84 mg/dL (ref 70–99)
HCT: 40 % (ref 39.0–52.0)
Hemoglobin: 13.6 g/dL (ref 13.0–17.0)
Potassium: 4.3 mEq/L (ref 3.5–5.1)
Sodium: 142 mEq/L (ref 135–145)

## 2012-10-16 LAB — MRSA PCR SCREENING: MRSA by PCR: NEGATIVE

## 2012-10-16 SURGERY — REVISION OF ARTERIOVENOUS GORETEX GRAFT
Anesthesia: General | Site: Thigh | Laterality: Left | Wound class: Clean

## 2012-10-16 MED ORDER — ALBUMIN HUMAN 5 % IV SOLN
INTRAVENOUS | Status: DC | PRN
  Administered 2012-10-16: 10:00:00 via INTRAVENOUS

## 2012-10-16 MED ORDER — LORAZEPAM 0.5 MG PO TABS
0.5000 mg | ORAL_TABLET | Freq: Every day | ORAL | Status: DC
  Administered 2012-10-16 – 2012-10-17 (×2): 0.5 mg via ORAL
  Filled 2012-10-16 (×2): qty 1

## 2012-10-16 MED ORDER — LABETALOL HCL 5 MG/ML IV SOLN
10.0000 mg | INTRAVENOUS | Status: DC | PRN
  Filled 2012-10-16: qty 4

## 2012-10-16 MED ORDER — FAMOTIDINE 40 MG PO TABS
40.0000 mg | ORAL_TABLET | Freq: Every day | ORAL | Status: DC
  Administered 2012-10-17: 40 mg via ORAL
  Filled 2012-10-16: qty 1

## 2012-10-16 MED ORDER — LEVOTHYROXINE SODIUM 125 MCG PO TABS
125.0000 ug | ORAL_TABLET | Freq: Every day | ORAL | Status: DC
  Administered 2012-10-17: 125 ug via ORAL
  Filled 2012-10-16 (×2): qty 1

## 2012-10-16 MED ORDER — FUROSEMIDE 80 MG PO TABS
80.0000 mg | ORAL_TABLET | Freq: Two times a day (BID) | ORAL | Status: DC
  Administered 2012-10-16 – 2012-10-17 (×3): 80 mg via ORAL
  Filled 2012-10-16 (×4): qty 1

## 2012-10-16 MED ORDER — PROMETHAZINE HCL 25 MG PO TABS: 25.0000 mg | ORAL_TABLET | Freq: Four times a day (QID) | ORAL | Status: DC | PRN

## 2012-10-16 MED ORDER — CALCIUM ACETATE 667 MG PO CAPS
1334.0000 mg | ORAL_CAPSULE | Freq: Two times a day (BID) | ORAL | Status: DC | PRN
Start: 2012-10-16 — End: 2012-10-17
  Filled 2012-10-16: qty 2

## 2012-10-16 MED ORDER — HYDROMORPHONE HCL PF 1 MG/ML IJ SOLN: 0.2500 mg | INTRAMUSCULAR | Status: DC | PRN

## 2012-10-16 MED ORDER — 0.9 % SODIUM CHLORIDE (POUR BTL) OPTIME
TOPICAL | Status: DC | PRN
  Administered 2012-10-16: 1000 mL

## 2012-10-16 MED ORDER — MORPHINE SULFATE 2 MG/ML IJ SOLN
2.0000 mg | INTRAMUSCULAR | Status: DC | PRN
  Administered 2012-10-16 (×3): 2 mg via INTRAVENOUS
  Filled 2012-10-16 (×4): qty 1

## 2012-10-16 MED ORDER — OXYCODONE-ACETAMINOPHEN 10-325 MG PO TABS: 1.0000 | ORAL_TABLET | ORAL | Status: DC | PRN

## 2012-10-16 MED ORDER — LIDOCAINE-EPINEPHRINE 0.5 %-1:200000 IJ SOLN
INTRAMUSCULAR | Status: AC
  Filled 2012-10-16: qty 1

## 2012-10-16 MED ORDER — ONDANSETRON HCL 4 MG/2ML IJ SOLN
4.0000 mg | Freq: Four times a day (QID) | INTRAMUSCULAR | Status: DC | PRN
  Administered 2012-10-16: 4 mg via INTRAVENOUS
  Filled 2012-10-16: qty 2

## 2012-10-16 MED ORDER — SODIUM CHLORIDE 0.9 % IJ SOLN
3.0000 mL | Freq: Two times a day (BID) | INTRAMUSCULAR | Status: DC
  Administered 2012-10-16: 3 mL via INTRAVENOUS

## 2012-10-16 MED ORDER — HYDRALAZINE HCL 20 MG/ML IJ SOLN: 10.0000 mg | INTRAMUSCULAR | Status: DC | PRN

## 2012-10-16 MED ORDER — CALCIUM ACETATE 667 MG PO CAPS
2668.0000 mg | ORAL_CAPSULE | Freq: Three times a day (TID) | ORAL | Status: DC
  Administered 2012-10-16 – 2012-10-17 (×4): 2668 mg via ORAL
  Filled 2012-10-16 (×5): qty 4

## 2012-10-16 MED ORDER — SODIUM CHLORIDE 0.9 % IR SOLN
Status: DC | PRN
  Administered 2012-10-16: 10:00:00

## 2012-10-16 MED ORDER — SODIUM CHLORIDE 0.9 % IV SOLN
2000.0000 mg | Freq: Once | INTRAVENOUS | Status: AC
  Administered 2012-10-16: 2000 mg via INTRAVENOUS
  Filled 2012-10-16: qty 2000

## 2012-10-16 MED ORDER — PROPOFOL 10 MG/ML IV BOLUS
INTRAVENOUS | Status: DC | PRN
  Administered 2012-10-16 (×2): 50 mg via INTRAVENOUS
  Administered 2012-10-16: 150 mg via INTRAVENOUS

## 2012-10-16 MED ORDER — OXYCODONE-ACETAMINOPHEN 5-325 MG PO TABS
1.0000 | ORAL_TABLET | ORAL | Status: DC | PRN
  Administered 2012-10-17: 1 via ORAL
  Filled 2012-10-16: qty 1

## 2012-10-16 MED ORDER — METOPROLOL TARTRATE 1 MG/ML IV SOLN
2.0000 mg | INTRAVENOUS | Status: DC | PRN
  Filled 2012-10-16: qty 5

## 2012-10-16 MED ORDER — PNEUMOCOCCAL VAC POLYVALENT 25 MCG/0.5ML IJ INJ
0.5000 mL | INJECTION | INTRAMUSCULAR | Status: AC
  Administered 2012-10-17: 0.5 mL via INTRAMUSCULAR
  Filled 2012-10-16: qty 0.5

## 2012-10-16 MED ORDER — LIDOCAINE HCL (CARDIAC) 20 MG/ML IV SOLN
INTRAVENOUS | Status: DC | PRN
  Administered 2012-10-16: 80 mg via INTRAVENOUS

## 2012-10-16 MED ORDER — ZOLPIDEM TARTRATE 5 MG PO TABS
5.0000 mg | ORAL_TABLET | Freq: Every evening | ORAL | Status: DC | PRN
  Administered 2012-10-16: 5 mg via ORAL
  Filled 2012-10-16: qty 1

## 2012-10-16 MED ORDER — MIDAZOLAM HCL 5 MG/5ML IJ SOLN
INTRAMUSCULAR | Status: DC | PRN
  Administered 2012-10-16: 2 mg via INTRAVENOUS

## 2012-10-16 MED ORDER — PHENOL 1.4 % MT LIQD: 1.0000 | OROMUCOSAL | Status: DC | PRN

## 2012-10-16 MED ORDER — SODIUM CHLORIDE 0.9 % IV SOLN
INTRAVENOUS | Status: DC
  Administered 2012-10-16 (×2): via INTRAVENOUS

## 2012-10-16 MED ORDER — FENTANYL CITRATE 0.05 MG/ML IJ SOLN
INTRAMUSCULAR | Status: DC | PRN
  Administered 2012-10-16 (×4): 50 ug via INTRAVENOUS

## 2012-10-16 MED ORDER — EPHEDRINE SULFATE 50 MG/ML IJ SOLN
INTRAMUSCULAR | Status: DC | PRN
  Administered 2012-10-16 (×2): 5 mg via INTRAVENOUS

## 2012-10-16 MED ORDER — OXYCODONE HCL 5 MG PO TABS
5.0000 mg | ORAL_TABLET | ORAL | Status: DC | PRN
  Administered 2012-10-17: 5 mg via ORAL
  Filled 2012-10-16: qty 1

## 2012-10-16 MED ORDER — ASPIRIN 325 MG PO TABS
325.0000 mg | ORAL_TABLET | Freq: Every day | ORAL | Status: DC
  Administered 2012-10-17: 325 mg via ORAL
  Filled 2012-10-16: qty 1

## 2012-10-16 MED ORDER — TRAZODONE HCL 100 MG PO TABS
100.0000 mg | ORAL_TABLET | Freq: Every day | ORAL | Status: DC
  Administered 2012-10-16: 100 mg via ORAL
  Filled 2012-10-16 (×2): qty 1

## 2012-10-16 MED ORDER — FLUTICASONE PROPIONATE 50 MCG/ACT NA SUSP
2.0000 | Freq: Every day | NASAL | Status: DC
  Administered 2012-10-16: 2 via NASAL
  Filled 2012-10-16: qty 16

## 2012-10-16 MED ORDER — SODIUM CHLORIDE 0.9 % IV SOLN: 250.0000 mL | INTRAVENOUS | Status: DC | PRN

## 2012-10-16 MED ORDER — ONDANSETRON HCL 4 MG/2ML IJ SOLN
INTRAMUSCULAR | Status: DC | PRN
  Administered 2012-10-16: 4 mg via INTRAMUSCULAR

## 2012-10-16 MED ORDER — CALCIUM ACETATE 667 MG PO CAPS: 1334.0000 mg | ORAL_CAPSULE | Freq: Three times a day (TID) | ORAL | Status: DC

## 2012-10-16 MED ORDER — SODIUM CHLORIDE 0.9 % IJ SOLN: 3.0000 mL | INTRAMUSCULAR | Status: DC | PRN

## 2012-10-16 SURGICAL SUPPLY — 42 items
BENZOIN TINCTURE PRP APPL 2/3 (GAUZE/BANDAGES/DRESSINGS) ×3 IMPLANT
CANISTER SUCTION 2500CC (MISCELLANEOUS) ×3 IMPLANT
CLIP LIGATING EXTRA MED SLVR (CLIP) ×3 IMPLANT
CLIP LIGATING EXTRA SM BLUE (MISCELLANEOUS) ×3 IMPLANT
COVER SURGICAL LIGHT HANDLE (MISCELLANEOUS) ×3 IMPLANT
DECANTER SPIKE VIAL GLASS SM (MISCELLANEOUS) ×3 IMPLANT
DERMABOND ADHESIVE PROPEN (GAUZE/BANDAGES/DRESSINGS) ×1
DERMABOND ADVANCED .7 DNX6 (GAUZE/BANDAGES/DRESSINGS) ×2 IMPLANT
DRAPE INCISE IOBAN 66X45 STRL (DRAPES) ×3 IMPLANT
DRAPE PROXIMA HALF (DRAPES) ×3 IMPLANT
ELECT REM PT RETURN 9FT ADLT (ELECTROSURGICAL) ×3
ELECTRODE REM PT RTRN 9FT ADLT (ELECTROSURGICAL) ×2 IMPLANT
GEL ULTRASOUND 20GR AQUASONIC (MISCELLANEOUS) IMPLANT
GLOVE BIOGEL PI IND STRL 6.5 (GLOVE) ×2 IMPLANT
GLOVE BIOGEL PI IND STRL 7.0 (GLOVE) ×2 IMPLANT
GLOVE BIOGEL PI INDICATOR 6.5 (GLOVE) ×1
GLOVE BIOGEL PI INDICATOR 7.0 (GLOVE) ×1
GLOVE SS BIOGEL STRL SZ 6.5 (GLOVE) ×2 IMPLANT
GLOVE SS BIOGEL STRL SZ 7.5 (GLOVE) ×2 IMPLANT
GLOVE SUPERSENSE BIOGEL SZ 6.5 (GLOVE) ×1
GLOVE SUPERSENSE BIOGEL SZ 7.5 (GLOVE) ×1
GOWN STRL NON-REIN LRG LVL3 (GOWN DISPOSABLE) ×9 IMPLANT
GRAFT GORETEX STND 6X20 (Vascular Products) ×3 IMPLANT
GRAFT GORETEXSTD 6X20 (Vascular Products) ×2 IMPLANT
KIT BASIN OR (CUSTOM PROCEDURE TRAY) ×3 IMPLANT
KIT ROOM TURNOVER OR (KITS) ×3 IMPLANT
NEEDLE HYPO 25GX1X1/2 BEV (NEEDLE) ×3 IMPLANT
NS IRRIG 1000ML POUR BTL (IV SOLUTION) ×3 IMPLANT
PACK CV ACCESS (CUSTOM PROCEDURE TRAY) ×3 IMPLANT
PAD ARMBOARD 7.5X6 YLW CONV (MISCELLANEOUS) ×6 IMPLANT
SPONGE GAUZE 4X4 12PLY (GAUZE/BANDAGES/DRESSINGS) ×3 IMPLANT
STRIP CLOSURE SKIN 1/2X4 (GAUZE/BANDAGES/DRESSINGS) ×3 IMPLANT
SUT PROLENE 6 0 CC (SUTURE) ×9 IMPLANT
SUT SILK 2 0 FS (SUTURE) IMPLANT
SUT VIC AB 3-0 SH 27 (SUTURE) ×3
SUT VIC AB 3-0 SH 27X BRD (SUTURE) ×6 IMPLANT
SUT VIC AB 4-0 PS2 27 (SUTURE) ×3 IMPLANT
TAPE CLOTH SURG 4X10 WHT LF (GAUZE/BANDAGES/DRESSINGS) ×3 IMPLANT
TOWEL OR 17X24 6PK STRL BLUE (TOWEL DISPOSABLE) ×3 IMPLANT
TOWEL OR 17X26 10 PK STRL BLUE (TOWEL DISPOSABLE) ×3 IMPLANT
UNDERPAD 30X30 INCONTINENT (UNDERPADS AND DIAPERS) ×3 IMPLANT
WATER STERILE IRR 1000ML POUR (IV SOLUTION) ×3 IMPLANT

## 2012-10-16 NOTE — Op Note (Signed)
OPERATIVE REPORT  DATE OF SURGERY: 10/16/2012  PATIENT: [name], 47 y.o. male MRN: [id number]  DOB: 10/11/1965  PRE-OPERATIVE DIAGNOSIS: End-stage renal disease with eroded segment of left femoral loop graft  POST-OPERATIVE DIAGNOSIS:  Same  PROCEDURE: Rerouting of arterial limb of left femoral loop AV Gore-Tex graft with replacement of 6 mm Gore-Tex and removal of exposed segment  SURGEON:  Curt Jews, M.D.  PHYSICIAN ASSISTANT: Roczniak  ANESTHESIA:  Gen.  EBL: Minimal ml  Total I/O In: 850 [I.V.:600; IV Piggyback:250] Out: 75 [Blood:75]  BLOOD ADMINISTERED: None  DRAINS: None  SPECIMEN: None  COUNTS CORRECT:  YES  PLAN OF CARE: PACU   PATIENT DISPOSITION:  PACU - hemodynamically stable  PROCEDURE DETAILS: The patient was taken to the operating room placed supine position. The left thigh was prepped and draped in usual sterile fashion. An incision was made at the apex of the existing femoral loop and was also made over the arterial graft near the anastomosis in the groin. The graft was exposed at both of these places. A straight Gore tunneler was used to create a tunnel from these 2 incisions. Care was taken to make a straight and very superficial for eventual use for access. The existing graft was occluded proximally and was occluded distally 3 to the incisions and was transected. A segment of Gore-Tex graft was removed from the nonfunctional part of the access. A new interposition graft cut to appropriate length and was sewn end-to-end to the existing graft at the proximal and distal incisions. This was with running 6-0 Prolene sutures. Clamps removed good thrill was noted. These were irrigated with saline hemostasis electrocautery. The wounds were closed with 3-0 Vicryl in the subcutaneous and subcuticular tissue. Sterile dressing was applied.  Next the area of the concern over the existing graft was debrided. There was a partially golfball sized area of  cannulation tissue which was debrided from this. There was existing Gore-Tex and this was all debrided as well. Wounds were cultured. The wound was with Betadine soaked 4 x 4. The patient was transferred to the recovery room in stable condition   Curt Jews, M.D. 10/16/2012 12:39 PM

## 2012-10-16 NOTE — Anesthesia Procedure Notes (Signed)
Procedure Name: LMA Insertion Date/Time: 10/16/2012 8:52 AM Performed by: [place] Pink Pre-anesthesia Checklist: Patient identified, Timeout performed, Emergency Drugs available, Suction available and Patient being monitored Patient Re-evaluated:Patient Re-evaluated prior to inductionOxygen Delivery Method: Circle system utilized Preoxygenation: Pre-oxygenation with 100% oxygen Intubation Type: IV induction Ventilation: Mask ventilation without difficulty LMA: LMA with gastric port inserted LMA Size: 5.0 Number of attempts: 1 Placement Confirmation: positive ETCO2 and breath sounds checked- equal and bilateral Tube secured with: Tape Dental Injury: Teeth and Oropharynx as per pre-operative assessment

## 2012-10-16 NOTE — Interval H&P Note (Signed)
History and Physical Interval Note:  10/16/2012 8:14 AM  [name]  has presented today for surgery, with the diagnosis of End Stage Renal Disease Complications of AVG  The various methods of treatment have been discussed with the patient and family. After consideration of risks, benefits and other options for treatment, the patient has consented to  Procedure(s): REVISION OF LEFT FEMORAL LOOP ARTERIOVENOUS GORETEX GRAFT (Left) as a surgical intervention .  The patient's history has been reviewed, patient examined, no change in status, stable for surgery.  I have reviewed the patient's chart and labs.  Questions were answered to the patient's satisfaction.     Burlene Montecalvo

## 2012-10-16 NOTE — Transfer of Care (Signed)
Immediate Anesthesia Transfer of Care Note  Patient: [name]  Procedure(s) Performed: Procedure(s): REVISION OF LEFT FEMORAL LOOP ARTERIOVENOUS GORETEX GRAFT (Left) INCISION AND Debridement left thigh graft (Left)  Patient Location: PACU  Anesthesia Type:General  Level of Consciousness: awake, alert  and oriented  Airway & Oxygen Therapy: Patient Spontanous Breathing and Patient connected to nasal cannula oxygen  Post-op Assessment: Report given to PACU RN, Post -op Vital signs reviewed and stable and Patient moving all extremities  Post vital signs: Reviewed and stable  Complications: No apparent anesthesia complications

## 2012-10-16 NOTE — Preoperative (Signed)
Beta Blockers   Reason not to administer Beta Blockers:Not Applicable 

## 2012-10-16 NOTE — Anesthesia Preprocedure Evaluation (Addendum)
Anesthesia Evaluation  Patient identified by MRN, date of birth, ID band Patient awake    Reviewed: Allergy & Precautions, H&P , NPO status , Patient's Chart, lab work & pertinent test results  Airway Mallampati: II      Dental   Pulmonary shortness of breath and with exertion,  breath sounds clear to auscultation        Cardiovascular negative cardio ROS  Rhythm:Regular Rate:Normal     Neuro/Psych  Headaches, Seizures -,     GI/Hepatic Neg liver ROS, GERD-  ,  Endo/Other  Hypothyroidism   Renal/GU Renal disease     Musculoskeletal   Abdominal   Peds  Hematology   Anesthesia Other Findings   Reproductive/Obstetrics                          Anesthesia Physical Anesthesia Plan  ASA: III  Anesthesia Plan: General   Post-op Pain Management:    Induction: Intravenous  Airway Management Planned: Oral ETT  Additional Equipment:   Intra-op Plan:   Post-operative Plan: Extubation in OR  Informed Consent: I have reviewed the patients History and Physical, chart, labs and discussed the procedure including the risks, benefits and alternatives for the proposed anesthesia with the patient or authorized representative who has indicated his/her understanding and acceptance.   Dental advisory given  Plan Discussed with: CRNA, Anesthesiologist and Surgeon  Anesthesia Plan Comments:         Anesthesia Quick Evaluation

## 2012-10-16 NOTE — Anesthesia Postprocedure Evaluation (Signed)
  Anesthesia Post-op Note  Patient: [name]  Procedure(s) Performed: Procedure(s): REVISION OF LEFT FEMORAL LOOP ARTERIOVENOUS GORETEX GRAFT (Left) INCISION AND Debridement left thigh graft (Left)  Patient Location: PACU  Anesthesia Type:General  Level of Consciousness: awake  Airway and Oxygen Therapy: Patient Spontanous Breathing  Post-op Pain: mild  Post-op Assessment: Post-op Vital signs reviewed  Post-op Vital Signs: Reviewed  Complications: No apparent anesthesia complications

## 2012-10-16 NOTE — Consult Note (Signed)
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  He states he has very low urine output and is independent with pouching application and emptying.  He denies need for assistance or having any pouch-related problems.  Urostomy pouch intact with good seal.  Unable to visualize stoma since pouch is opaque.  Small amt blue-tinged urine in pouch which is related to a deodorizer he applies inside the pouch.  Extra one piece urostomy pouch given to pt in case current pouch begins leaking.  Pt denies need for further assistance. Requested to assess left calf and right toe.  Pt states he bumped his leg prior to admission.  Partial thickness abrasion .1X.1X.1cm pink and moist, small amt yellow drainage.  Foam dressing applied to protect and promote healing. Right 3rd toe with loose toenail barely hanging on which he bumped at home and removes easily.  Dark red moist woundbed with small amt red drainage.  Applied foam dressing to protect and promote healing. Please re-consult if further assistance is needed.  Thank-you,  Julien Girt MSN, [place], [place], [place], [place]

## 2012-10-16 NOTE — Progress Notes (Addendum)
10/16/2012  Patient came from PACU to [facility], patient was alert, oriented and ambulatory. He have a left thigh graft and had a revision of left femoral loop arteriovenous gertex graft. He have a skin tear on left calf which he claim he bump his leg and on the third toe the nail loose.Left leg swollen, noted in the foot area. Patient was eventually place on telemetry. He also have a urostomy bad on  Right quadrant. [facility] RN.

## 2012-10-16 NOTE — H&P (View-Only) (Signed)
The patient presents today for continued followup of access issues in his left femoral loop AV Gore-Tex graft. Most recently he had removal of a segment of eroded graft by Dr. Bridgett Larsson on 06/27/2012. He is using the new segment of graft in his left thigh. His prior incision on the arterial limb of the graft has continued to not heal and does have extensive granulation tissue and the patient tells me that this has cultured staff and he is on antibiotics for this.  On physical exam the patient does have an area approximately 2-3 cm opened with a granulating tissue which is healing very poorly. There is no exposed graft I suspect this is the base of this wound. There is mild surrounding erythema  Impression and plan: Continued issue with his left femoral loop graft. He will require excision of the involved area. He is questioning the ability to completely replace his left femoral graft. I explained that I would reserve this for more  Involvement. He understands that these are a continued attempts to salvage this. He has not had a right femoral loop graft and I agree with the patient that we need to stay he clear this area as long as possible. He does dialyze on Monday Wednesday and Friday and wishes that I do the surgery. I will remove his dialysis today so that he can have surgery on 10:15 as an outpatient at [facility]. I did explain possibility of recurrent graft infection since he does have an infected site. He will will continue antibiotics with his dialysis treatment until surgery.

## 2012-10-16 NOTE — Care Management Note (Signed)
   CARE MANAGEMENT NOTE 10/16/2012  Patient:  [name], [name]   Account Number:  [id number]  Date Initiated:  10/16/2012  Documentation initiated by:  Jasmine Pang  Subjective/Objective Assessment:   Order for wet to dry dressing changes daily with HHRN to assist.     Action/Plan:   Met with pt who selected [facility] for dressing changes, [facility] notified.   Anticipated DC Date:  10/17/2012   Anticipated DC Plan:  [place]         Choice offered to / List presented to:          [facility] arranged  HH-1 RN      [place].   Status of service:  Completed, signed off Medicare Important Message given?   (If response is "NO", the following Medicare IM given date fields will be blank) Date Medicare IM given:   Date Additional Medicare IM given:    Discharge Disposition:  [place]  Per UR Regulation:    If discussed at Long Length of Stay Meetings, dates discussed:    Comments:

## 2012-10-17 NOTE — Progress Notes (Addendum)
[facility] [place]  Subjective  - doing well no complaints of steal.   Objective 123/61 62 98.3 F (36.8 C) (Oral) 18 97%  Intake/Output Summary (Last 24 hours) at 10/17/12 0900 Last data filed at 10/17/12 0500  Gross per 24 hour  Intake   1570 ml  Output     75 ml  Net   1495 ml    Palpable thrill left thigh graft Wound without active drainage Wound packed with wet gauze and covered with dry 4 x 4   Assessment/Planning: S/P revision left thigh graft.  Rerouting of arterial limb of left femoral loop AV Gore-Tex graft with replacement of 6 mm Gore-Tex and removal of exposed segment   Wound cultures pending  D/C home he will get antibiotics while on dialysis. F/U with Dr. Donnetta Hutching in 2 weeks for wound check  Theda Sers, [place] [facility] 10/17/2012 9:00 AM --  Laboratory Lab Results:  Recent Labs  10/16/12 0654  HGB 13.6  HCT 40.0   BMET  Recent Labs  10/16/12 0654  NA 142  K 4.3  GLUCOSE 84    COAG Lab Results  Component Value Date   INR 1.1 08/23/2008   No results found for this basename: PTT

## 2012-10-17 NOTE — Progress Notes (Signed)
Pt discharged to home after visit summary reviewed and pt capable of re verbalizing medications and follow up appointments. Pt remains stable. No signs and symptoms of distress. Educated to return to ER in the event of SOB, dizziness, chest pain, or fainting. Edda Orea, RN   

## 2012-10-17 NOTE — Discharge Summary (Signed)
[facility] Discharge Summary   Patient ID:  [name] MRN: [id number] DOB/AGE: 47-Dec-1967 47 y.o.  Admit date: 10/16/2012 Discharge date: 10/17/2012 Date of Surgery: 10/16/2012 Surgeon: Surgeon(s): Rosetta Posner, MD  Admission Diagnosis: End Stage Renal Disease Complications of AVG  Discharge Diagnoses:  End Stage Renal Disease Complications of AVG  Secondary Diagnoses: Past Medical History  Diagnosis Date  . End stage renal failure on dialysis   . Spina bifida   . Seizures     last 1998  . GERD (gastroesophageal reflux disease)   . Insomnia   . Anxiety   . Hypothyroidism   . Anemia   . Headache(784.0)   . Shortness of breath     rarely  . Kidney stones     Procedure(s): REVISION OF LEFT FEMORAL LOOP ARTERIOVENOUS GORETEX GRAFT INCISION AND Debridement left thigh graft  Discharged Condition: good  HPI: The patient presents today 10/08/2012 for continued followup of access issues in his left femoral loop AV Gore-Tex graft. Most recently he had removal of a segment of eroded graft by Dr. Bridgett Larsson on 06/27/2012. He is using the new segment of graft in his left thigh. His prior incision on the arterial limb of the graft has continued to not heal and does have extensive granulation tissue and the patient tells me that this has cultured staff and he is on antibiotics for this.  On physical exam the patient does have an area approximately 2-3 cm opened with a granulating tissue which is healing very poorly. There is no exposed graft I suspect this is the base of this wound. There is mild surrounding erythema.  Gottfried Gionfriddo has presented 10/16/2012 for surgery, with the diagnosis of End Stage Renal Disease and Complications of AVG.  Dr. Donnetta Hutching rerouted of arterial limb of left femoral loop AV Gore-Tex graft with replacement of 6 mm Gore-Tex and removal of exposed segment.  The superior proximal wound  Will have home health set up for wet to dry packing  daily.  He will get antibiotics while at dialysis.  He will f/u in our office in 2 weeks.       Hospital Course:  Zacharia Hagarty is a 47 y.o. male is S/P Left Procedure(s): REVISION OF LEFT FEMORAL LOOP ARTERIOVENOUS GORETEX GRAFT INCISION AND Debridement left thigh graft Extubated: POD # 0 Physical exam: Palpable thrill left thigh graft  Wound without active drainage  Wound packed with wet gauze and covered with dry 4 x 4 Pt. Ambulating, voiding and taking PO diet without difficulty. Pt pain controlled with PO pain meds. Labs as below Complications:none  Consults:     Significant Diagnostic Studies: CBC Lab Results  Component Value Date   WBC 6.6 07/31/2010   HGB 13.6 10/16/2012   HCT 40.0 10/16/2012   MCV 92.1 07/31/2010   PLT 128* 07/31/2010    BMET    Component Value Date/Time   NA 142 10/16/2012 0654   K 4.3 10/16/2012 0654   CL 96 06/28/2012 0630   CO2 31 06/28/2012 0630   GLUCOSE 84 10/16/2012 0654   BUN 31* 06/28/2012 0630   CREATININE 7.95* 06/28/2012 0630   CALCIUM 9.2 06/28/2012 0630   GFRNONAA 7* 06/28/2012 0630   GFRAA 8* 06/28/2012 0630   COAG Lab Results  Component Value Date   INR 1.1 08/23/2008     Disposition:  Discharge to :Home Discharge Orders   Future Orders Complete By Expires   Call MD for:  redness, tenderness, or signs  of infection (pain, swelling, bleeding, redness, odor or green/yellow discharge around incision site)  As directed    Call MD for:  severe or increased pain, loss or decreased feeling  in affected limb(s)  As directed    Call MD for:  temperature >100.5  As directed    Change dressing (specify)  As directed    Comments:     Dressing change: 1 time per day using wet to dry dressings, per Home Health insructions.   Discharge patient  As directed    Comments:     Discharge pt to home   Driving Restrictions  As directed    Comments:     No driving for 1 week.   Increase activity slowly  As directed    Comments:      Walk with assistance use walker or cane as needed   May shower   As directed    Resume previous diet  As directed        Medication List         aspirin 325 MG tablet  Take 325 mg by mouth daily.     ASTELIN 137 MCG/SPRAY nasal spray  Generic drug:  azelastine  Place 1 spray into the nose 2 (two) times daily. Use in each nostril as directed     calcium acetate 667 MG capsule  Commonly known as:  PHOSLO  Take 1,334-2,668 mg by mouth 3 (three) times daily with meals. 4 capsules with meals, 2 with snacks     famotidine 40 MG tablet  Commonly known as:  PEPCID  Take 40 mg by mouth daily.     fluticasone 50 MCG/ACT nasal spray  Commonly known as:  FLONASE  Place 2 sprays into the nose daily.     furosemide 80 MG tablet  Commonly known as:  LASIX  Take 80 mg by mouth 2 (two) times daily.     levothyroxine 125 MCG tablet  Commonly known as:  SYNTHROID, LEVOTHROID  Take 125 mcg by mouth daily before breakfast.     LORazepam 0.5 MG tablet  Commonly known as:  ATIVAN  Take 0.5 mg by mouth daily.     oxyCODONE-acetaminophen 10-325 MG per tablet  Commonly known as:  PERCOCET  Take 1 tablet by mouth every 4 (four) hours as needed for pain.     promethazine 25 MG tablet  Commonly known as:  PHENERGAN  Take 25 mg by mouth every 6 (six) hours as needed for nausea.     SENSIPAR PO  Take by mouth. Unknown dose patient has not taken this since early spring due to insurance issues. But patient requested I leave this on this list because he should be taking.     sodium polystyrene powder  Commonly known as:  KAYEXALATE  Take 15 g by mouth 2 (two) times a week. Saturday and Sunday     traZODone 100 MG tablet  Commonly known as:  DESYREL  Take 100 mg by mouth at bedtime.     zolpidem 10 MG tablet  Commonly known as:  AMBIEN  Take 10 mg by mouth at bedtime as needed for sleep.       Verbal and written Discharge instructions given to the patient. Wound care per Discharge AVS      Follow-up Information   Follow up with EARLY, TODD, MD In 2 weeks. (sent)    Specialty:  Vascular Surgery   Contact information:   [address] [place] [address] [phone]  SignedRoxy Horseman 10/17/2012, 9:12 AM

## 2012-10-17 NOTE — Progress Notes (Signed)
Pt dressing noted to have scant amount of fresh blood to center of old blood present on dressing. Dressing reinforced. Will continue to monitor. Dorthey Sawyer

## 2012-10-18 ENCOUNTER — Encounter (HOSPITAL_COMMUNITY): Payer: Self-pay | Admitting: Vascular Surgery

## 2012-10-18 LAB — WOUND CULTURE: Culture: NO GROWTH

## 2012-10-21 LAB — ANAEROBIC CULTURE

## 2012-11-04 ENCOUNTER — Encounter: Payer: Self-pay | Admitting: Vascular Surgery

## 2012-11-05 ENCOUNTER — Encounter: Payer: Self-pay | Admitting: Vascular Surgery

## 2012-11-05 ENCOUNTER — Ambulatory Visit (INDEPENDENT_AMBULATORY_CARE_PROVIDER_SITE_OTHER): Payer: Self-pay | Admitting: Vascular Surgery

## 2012-11-05 VITALS — BP 158/88 | HR 72 | Temp 98.9°F | Ht 71.0 in | Wt 276.9 lb

## 2012-11-05 DIAGNOSIS — N186 End stage renal disease: Secondary | ICD-10-CM

## 2012-11-05 NOTE — Progress Notes (Signed)
Patient is a for followup of his left femoral AV Gore-Tex graft revision. He has had a multiple year 10-15 year history of a Gore-Tex graft in this extremity. He has had episodes of erosion. He had a treatment Gaylene Moylan in the fall with replacement of segment of the graft the 2 exposure. He did have a persistent wound issues with one of these incisions was taken back to the operating room by myself on 10/16/2012 where he underwent a rewrite routing of the arterial limb of his graft replacement of this with 6 mm Gore-Tex. He had removal of the exposed segment. He has done quite well with essentially total healing of his incisions. He does have a good thrill through the graft. He will continue use of the existing venous portion of the graft for another 2 weeks and then can use of the entirety of the loop. He'll be seen on an as-needed basis

## 2012-11-07 ENCOUNTER — Other Ambulatory Visit: Payer: Self-pay

## 2012-12-05 DIAGNOSIS — D689 Coagulation defect, unspecified: Secondary | ICD-10-CM | POA: Insufficient documentation

## 2012-12-09 DIAGNOSIS — L299 Pruritus, unspecified: Secondary | ICD-10-CM | POA: Insufficient documentation

## 2012-12-09 DIAGNOSIS — R519 Headache, unspecified: Secondary | ICD-10-CM | POA: Insufficient documentation

## 2012-12-30 DIAGNOSIS — G40909 Epilepsy, unspecified, not intractable, without status epilepticus: Secondary | ICD-10-CM | POA: Insufficient documentation

## 2013-04-28 DIAGNOSIS — G8929 Other chronic pain: Secondary | ICD-10-CM | POA: Insufficient documentation

## 2013-06-25 ENCOUNTER — Telehealth: Payer: Self-pay

## 2013-06-25 NOTE — Telephone Encounter (Signed)
Phone call from pt.  Reported has area on mid-outer left thigh with a "crusty" surface, that has never healed, from his procedure last June.  Reported while bathing last night he noticed some redness just above the crusty area; stated it bled when he brushed over the site with a washcloth.  Then, stated that Dr. Joelyn Oms expressed pus from the same site today.  [facility] the wound, and started Vancomycin with his treatment.  Was advised to start Augmentin, orally.  Requested appt. Offered appt. 07/02/13;  pt. declined stating he is so tired after dialysis treatment.  Advised a scheduler will call pt. With an appt.  Advised to call office if has fever/chills, increased redness or increased size of left thigh ulceration, or if has worsening drainage from ulcer, prior to scheduled appt.  Verb. Understanding.

## 2013-07-07 ENCOUNTER — Encounter: Payer: Self-pay | Admitting: Vascular Surgery

## 2013-07-08 ENCOUNTER — Ambulatory Visit (INDEPENDENT_AMBULATORY_CARE_PROVIDER_SITE_OTHER): Payer: Medicare Other | Admitting: Vascular Surgery

## 2013-07-08 ENCOUNTER — Encounter: Payer: Self-pay | Admitting: Vascular Surgery

## 2013-07-08 VITALS — BP 165/80 | HR 70 | Resp 18 | Ht 71.0 in | Wt 264.0 lb

## 2013-07-08 DIAGNOSIS — N186 End stage renal disease: Secondary | ICD-10-CM

## 2013-07-08 DIAGNOSIS — T82898A Other specified complication of vascular prosthetic devices, implants and grafts, initial encounter: Secondary | ICD-10-CM

## 2013-07-08 NOTE — Progress Notes (Signed)
The patient has today for evaluation of left thigh AV Gore-Tex graft issues. I then infiltrated years. He has had multiple episodes of graft erosion and graft replacement from his left thigh graft. He presents today with concern regarding an area of the lateralmost aspect of his incisions. There is no functional graft under this. He does have a good thrill in his graft and there is some area of graft degeneration throughout his left femoral loop. The area of concern is actually lateral to this. He does have adequate dialysis through the graft.  Past Medical History  Diagnosis Date  . End stage renal failure on dialysis   . Spina bifida   . Seizures     last 1998  . GERD (gastroesophageal reflux disease)   . Insomnia   . Anxiety   . Hypothyroidism   . Anemia   . Headache(784.0)   . Shortness of breath     rarely  . Kidney stones     History  Substance Use Topics  . Smoking status: Never Smoker   . Smokeless tobacco: Never Used  . Alcohol Use: No    Family History  Problem Relation Age of Onset  . Diabetes Mother   . Hypertension Mother   . Heart disease Mother   . Diabetes Father   . Heart attack Father     X's 3  . Diabetes Sister   . Bipolar disorder Sister     Allergies  Allergen Reactions  . Hydrocodone Other (See Comments)    Pt states that after 3 weeks of taking this medication he will began to "twitch"  . Contrast Media [Iodinated Diagnostic Agents] Nausea And Vomiting    Oral dye causes vomiting, IV dye is okay  . Furadantin [Nitrofurantoin]     unknown  . Mandelamine [Methenamine]     unknown  . Noroxin [Norfloxacin]     unknown  . Sulfa Antibiotics     unknown    Current outpatient prescriptions:acetaminophen (TYLENOL) 325 MG tablet, Take 650 mg by mouth 3 (three) times a week. Takes at dialysis treatments (m/W/F), Disp: , Rfl: ;  aspirin 325 MG tablet, Take 325 mg by mouth daily., Disp: , Rfl: ;  azelastine (ASTELIN) 137 MCG/SPRAY nasal spray, Place 1  spray into the nose 2 (two) times daily. Use in each nostril as directed, Disp: , Rfl:  calcium acetate (PHOSLO) 667 MG capsule, Take 1,334-2,668 mg by mouth 3 (three) times daily with meals. 4 capsules with meals, 2 with snacks, Disp: , Rfl: ;  Cinacalcet HCl (SENSIPAR PO), Take by mouth. Unknown dose patient has not taken this since Melinda Pottinger spring due to insurance issues. But patient requested I leave this on this list because he should be taking., Disp: , Rfl:  DiphenhydrAMINE HCl (BENADRYL ALLERGY PO), Take by mouth 3 (three) times a week. Three times weekly at dialysis (M/W/F), Disp: , Rfl: ;  famotidine (PEPCID) 40 MG tablet, Take 40 mg by mouth daily., Disp: , Rfl: ;  fluticasone (FLONASE) 50 MCG/ACT nasal spray, Place 2 sprays into the nose daily., Disp: , Rfl: ;  furosemide (LASIX) 80 MG tablet, Take 80 mg by mouth 2 (two) times daily., Disp: , Rfl:  levothyroxine (SYNTHROID, LEVOTHROID) 125 MCG tablet, Take 125 mcg by mouth daily before breakfast., Disp: , Rfl: ;  LORazepam (ATIVAN) 0.5 MG tablet, Take 0.5 mg by mouth daily., Disp: , Rfl: ;  oxyCODONE-acetaminophen (PERCOCET) 10-325 MG per tablet, Take 1 tablet by mouth every 4 (four) hours  as needed for pain., Disp: 30 tablet, Rfl: 0 promethazine (PHENERGAN) 25 MG tablet, Take 25 mg by mouth every 6 (six) hours as needed for nausea., Disp: , Rfl: ;  sodium polystyrene (KAYEXALATE) powder, Take 15 g by mouth 2 (two) times a week. Saturday and Sunday, Disp: , Rfl: ;  traZODone (DESYREL) 100 MG tablet, Take 100 mg by mouth at bedtime., Disp: , Rfl: ;  zolpidem (AMBIEN) 10 MG tablet, Take 10 mg by mouth at bedtime as needed for sleep., Disp: , Rfl:   BP 165/80  Pulse 70  Resp 18  Ht 5\' 11"  (1.803 m)  Wt 264 lb (119.75 kg)  BMI 36.84 kg/m2  Body mass index is 36.84 kg/(m^2).       Physical exam he has a good thrill through the graft The area of concern has minimal erythema. There is an eschar present. This was debrided in the office and he  asked as a segment of nonfunctional cortex graft that has eroded to the skin. I debrided this back to below the skin level.  Impression and plan erosion of small segment of nonfunctional cortex graft in his left femoral region. Feel this has a high likelihood of resolving the problem. I explained he may continue to have erythema and ongoing issues with this. If so he will require surgical excision of this in the operating room. He will continue to monitor this at the dialysis center and notify should this continues to smolder.

## 2014-02-16 DIAGNOSIS — R739 Hyperglycemia, unspecified: Secondary | ICD-10-CM | POA: Insufficient documentation

## 2014-06-15 DIAGNOSIS — R5383 Other fatigue: Secondary | ICD-10-CM | POA: Insufficient documentation

## 2014-09-17 DIAGNOSIS — J988 Other specified respiratory disorders: Secondary | ICD-10-CM | POA: Insufficient documentation

## 2015-01-25 ENCOUNTER — Telehealth: Payer: Self-pay

## 2015-01-25 NOTE — Telephone Encounter (Signed)
rec'd voice message from pt, with request for appt. with Dr. Donnetta Hutching due to "issues with my leg".  Attempted to call pt. At 1:10 PM.  Left voice message to return call to discuss symptoms.

## 2015-01-25 NOTE — Telephone Encounter (Signed)
2nd v/m left for pt.

## 2015-01-26 NOTE — Telephone Encounter (Signed)
Spoke with pt re appt, dpm  °

## 2015-01-26 NOTE — Telephone Encounter (Signed)
Phone call to pt. To discuss symptoms.  Reported he had a small scab over the left thigh graft site, that recently got pulled off.  Reported there is an open area in the upper outer region of the left thigh that is approx. size of small battery.  Denied any swelling, redness, or drainage.  Reported he has been applying Neosporin Oint. To the area and covering with bandaid.  Stated he is scheduled for an appt. With his PCP on 02/01/15, and prefers to have the appt. with Dr. Donnetta Hutching after 1/30.  Pt. advised to call if symptoms worsen prior to appt. will contact pt. With appt. Information.  Agrees with plan.

## 2015-02-01 ENCOUNTER — Encounter: Payer: Self-pay | Admitting: Family

## 2015-02-02 ENCOUNTER — Ambulatory Visit (INDEPENDENT_AMBULATORY_CARE_PROVIDER_SITE_OTHER): Payer: Medicare Other | Admitting: Family

## 2015-02-02 ENCOUNTER — Encounter: Payer: Self-pay | Admitting: Family

## 2015-02-02 VITALS — BP 95/58 | HR 74 | Temp 98.6°F | Ht 71.0 in | Wt 275.4 lb

## 2015-02-02 DIAGNOSIS — Z992 Dependence on renal dialysis: Secondary | ICD-10-CM

## 2015-02-02 DIAGNOSIS — T859XXD Unspecified complication of internal prosthetic device, implant and graft, subsequent encounter: Secondary | ICD-10-CM

## 2015-02-02 DIAGNOSIS — N186 End stage renal disease: Secondary | ICD-10-CM

## 2015-02-02 NOTE — Progress Notes (Signed)
Established Dialysis Access  History of Present Illness  [name] is a 50 y.o. (01-31-1965) male patient of Dr. Donnetta Hutching who returns today with c/o small dry erosion of non functioning portion of left thigh graft. Pt denies pain, denies drainage from this erosion, denies fever or chills. He dialyzes M-W-F.  Dr. Donnetta Hutching last saw pt on 07/08/13 for left thigh AV Gore-Tex graft issues placed in October 2015. Pt has had multiple episodes of graft erosion and graft replacement from his left thigh graft. He presented that day with concern regarding an area of the lateralmost aspect of his incisions. There was no functional graft under this. He had a good thrill in his graft and there was some area of graft degeneration throughout his left femoral loop. The area of concern was actually lateral to this. He does have adequate dialysis through the graft. The area of concern had minimal erythema. There was an eschar present. This was debrided in the office as a segment of nonfunctional cortex graft that has eroded to the skin. Dr. Donnetta Hutching debrided this back to below the skin level.  Erosion of small segment of nonfunctional Gortex graft in his left femoral region. Dr. Donnetta Hutching felt this had a high likelihood of resolving the problem and explained pt may continue to have erythema and ongoing issues with this. If so he would require surgical excision of this in the operating room. Pt was to continue to monitor this at the dialysis center and notify us should this continues to smolder.     Past Medical History  Diagnosis Date  . End stage renal failure on dialysis ([place])   . Spina bifida ([place])   . Seizures ([place])     last 1998  . GERD (gastroesophageal reflux disease)   . Insomnia   . Anxiety   . Hypothyroidism   . Anemia   . Headache(784.0)   . Shortness of breath     rarely  . Kidney stones     Social History Social History  Substance Use Topics  . Smoking status: Never Smoker   . Smokeless  tobacco: Never Used  . Alcohol Use: No    Family History Family History  Problem Relation Age of Onset  . Diabetes Mother   . Hypertension Mother   . Heart disease Mother     before age 99  . Diabetes Father   . Heart attack Father     X's 3  . Diabetes Sister   . Bipolar disorder Sister     Surgical History Past Surgical History  Procedure Laterality Date  . Insertion of dialysis catheter    . Knee surgery Right   . Lithotripsy      x 3  . Av fistula placement Left 06/27/2012    Procedure: EXPLORATORY LEFT THY-GRAFT PSEUDO-ANEURYSM;  Surgeon: Conrad [place], MD;  Location: [facility];  Service: Vascular;  Laterality: Left;  Revision of left Arteriovenus gortex graft in thigh.  . Appendectomy    . Colonoscopy    . Arteriovenous graft placement Left 10/16/2012    left femoral goretex graft         Dr Donnetta Hutching  . Revision of arteriovenous goretex graft Left 10/16/2012    Procedure: REVISION OF LEFT FEMORAL LOOP ARTERIOVENOUS GORETEX GRAFT;  Surgeon: Rosetta Posner, MD;  Location: [facility];  Service: Vascular;  Laterality: Left;  . Incision and drainage Left 10/16/2012    Procedure: INCISION AND Debridement left thigh graft;  Surgeon: Arvilla Meres  Early, MD;  [facility];  Service: Vascular;  Laterality: Left;    Allergies  Allergen Reactions  . Hydrocodone Other (See Comments)    Pt states that after 3 weeks of taking this medication he will began to "twitch"  . Contrast Media [Iodinated Diagnostic Agents] Nausea And Vomiting    Oral dye causes vomiting, IV dye is okay  . Furadantin [Nitrofurantoin]     unknown  . Mandelamine [Methenamine]     unknown  . Noroxin [Norfloxacin]     unknown  . Sulfa Antibiotics     unknown    Current Outpatient Prescriptions  Medication Sig Dispense Refill  . acetaminophen (TYLENOL) 325 MG tablet Take 650 mg by mouth 3 (three) times a week. Takes at dialysis treatments (m/W/F)    . aspirin 325 MG tablet Take 325 mg by mouth daily.    Marland Kitchen azelastine  (ASTELIN) 137 MCG/SPRAY nasal spray Place 1 spray into the nose 2 (two) times daily. Use in each nostril as directed    . calcium acetate (PHOSLO) 667 MG capsule Take 1,334-2,668 mg by mouth 3 (three) times daily with meals. 4 capsules with meals, 2 with snacks    . Cinacalcet HCl (SENSIPAR PO) Take by mouth. Unknown dose patient has not taken this since early spring due to insurance issues. But patient requested I leave this on this list because he should be taking.    . DiphenhydrAMINE HCl (BENADRYL ALLERGY PO) Take by mouth 3 (three) times a week. Three times weekly at dialysis (M/W/F)    . famotidine (PEPCID) 40 MG tablet Take 40 mg by mouth daily.    . fluticasone (FLONASE) 50 MCG/ACT nasal spray Place 2 sprays into the nose daily.    . furosemide (LASIX) 80 MG tablet Take 80 mg by mouth 2 (two) times daily.    Marland Kitchen levothyroxine (SYNTHROID, LEVOTHROID) 125 MCG tablet Take 125 mcg by mouth daily before breakfast.    . LORazepam (ATIVAN) 0.5 MG tablet Take 0.5 mg by mouth daily.    Marland Kitchen oxyCODONE-acetaminophen (PERCOCET) 10-325 MG per tablet Take 1 tablet by mouth every 4 (four) hours as needed for pain. 30 tablet 0  . promethazine (PHENERGAN) 25 MG tablet Take 25 mg by mouth every 6 (six) hours as needed for nausea.    . sodium polystyrene (KAYEXALATE) powder Take 15 g by mouth 2 (two) times a week. Saturday and Sunday    . traZODone (DESYREL) 100 MG tablet Take 100 mg by mouth at bedtime.    Marland Kitchen zolpidem (AMBIEN) 10 MG tablet Take 10 mg by mouth at bedtime as needed for sleep.     No current facility-administered medications for this visit.     REVIEW OF SYSTEMS: see HPI for pertinent positives and negatives    PHYSICAL EXAMINATION:  Filed Vitals:   02/02/15 1433  BP: 95/58  Pulse: 74  Temp: 98.6 F (37 C)  TempSrc: Oral  Height: 5\' 11"  (1.803 m)  Weight: 275 lb 5.7 oz (124.9 kg)  SpO2: 95%   Body mass index is 38.42 kg/(m^2).   General: Obese male appears his stated age. He is  seated in a wheelchair.  HEENT:  No gross abnormalities Pulmonary: Respirations are non-labored Abdomen: Soft and non-tender. Musculoskeletal: There are no major deformities.   Neurologic: No focal weakness or paresthesias are detected. Skin: There are no ulcer or rashes noted. Psychiatric: The patient has normal affect. Cardiovascular: There is a regular rate and rhythm. Left thigh AV graft with  palpable thrill and audible bruit. No bruit or thrill over the small dry erosion on his left thigh, lateral to the AVG, no erythema, no drainage, no swelling.    Medical Decision Making  [name] is a 50 y.o. male who is s/p eft thigh AV Gore-Tex graft placed in October 2015. He has a small dry erosion to the skin from a portion of his non functioning AV graft. Dr. Donnetta Hutching spoke with and examined pt. Dr. Donnetta Hutching scheduled pt for revision of left thigh non functional graft section.    [name], Sharmon Leyden, RN, MSN, FNP-C [facility] Office: [phone]  02/02/2015, 2:37 PM  Clinic MD: Early

## 2015-02-04 ENCOUNTER — Other Ambulatory Visit: Payer: Self-pay

## 2015-02-08 ENCOUNTER — Encounter (HOSPITAL_COMMUNITY): Payer: Self-pay | Admitting: *Deleted

## 2015-02-08 ENCOUNTER — Telehealth: Payer: Self-pay | Admitting: Vascular Surgery

## 2015-02-08 MED ORDER — CHLORHEXIDINE GLUCONATE CLOTH 2 % EX PADS: 6.0000 | MEDICATED_PAD | Freq: Once | CUTANEOUS | Status: DC

## 2015-02-08 MED ORDER — SODIUM CHLORIDE 0.9 % IV SOLN
INTRAVENOUS | Status: DC
  Administered 2015-02-09: 08:00:00 via INTRAVENOUS

## 2015-02-08 MED ORDER — DEXTROSE 5 % IV SOLN
1.5000 g | INTRAVENOUS | Status: DC
  Filled 2015-02-08: qty 1.5

## 2015-02-08 NOTE — Progress Notes (Signed)
Pt denies cardiac history, chest pain or sob. States he has history of HTN, but has not been treated for it for at least 2 years.  Pt will be arriving via [facility] bus and he will need someone to call the [facility] bus when he is ready to leave.

## 2015-02-08 NOTE — Progress Notes (Signed)
   02/08/15 1118  OBSTRUCTIVE SLEEP APNEA  Have you ever been diagnosed with sleep apnea through a sleep study? No  Do you snore loudly (loud enough to be heard through closed doors)?  1  Do you often feel tired, fatigued, or sleepy during the daytime (such as falling asleep during driving or talking to someone)? 1  Has anyone observed you stop breathing during your sleep? 0  Do you have, or are you being treated for high blood pressure? 1  BMI more than 35 kg/m2? 1  Age > 50 (1-yes) 0  Neck circumference greater than:Male 16 inches or larger, Male 17inches or larger? 1  Male Gender (Yes=1) 1  Obstructive Sleep Apnea Score 6  Score 5 or greater  Results sent to PCP

## 2015-02-08 NOTE — Telephone Encounter (Signed)
Pt states he received a message about his surgery being moved and arrived at 730 am. He states he is confirming he will be there and have everything settle with [facility]. @ 249 PM ON QB:[id number]. If any questions please call him at [phone].Marland Kitchen Thanks AD

## 2015-02-09 ENCOUNTER — Ambulatory Visit (HOSPITAL_COMMUNITY)
Admission: RE | Admit: 2015-02-09 | Discharge: 2015-02-09 | Disposition: A | Discharge status: Home / Self Care | Payer: Medicare Other | Source: Ambulatory Visit | Attending: Vascular Surgery | Admitting: Vascular Surgery

## 2015-02-09 ENCOUNTER — Encounter (HOSPITAL_COMMUNITY): Payer: Self-pay | Admitting: *Deleted

## 2015-02-09 ENCOUNTER — Encounter (HOSPITAL_COMMUNITY)
Admission: RE | Disposition: A | Discharge status: Home / Self Care | Payer: Self-pay | Source: Ambulatory Visit | Attending: Vascular Surgery

## 2015-02-09 ENCOUNTER — Ambulatory Visit (HOSPITAL_COMMUNITY): Payer: Medicare Other | Admitting: Certified Registered Nurse Anesthetist

## 2015-02-09 ENCOUNTER — Other Ambulatory Visit: Payer: Self-pay

## 2015-02-09 DIAGNOSIS — T82898A Other specified complication of vascular prosthetic devices, implants and grafts, initial encounter: Secondary | ICD-10-CM | POA: Diagnosis present

## 2015-02-09 DIAGNOSIS — Z992 Dependence on renal dialysis: Secondary | ICD-10-CM | POA: Insufficient documentation

## 2015-02-09 DIAGNOSIS — E039 Hypothyroidism, unspecified: Secondary | ICD-10-CM | POA: Diagnosis not present

## 2015-02-09 DIAGNOSIS — E669 Obesity, unspecified: Secondary | ICD-10-CM | POA: Diagnosis not present

## 2015-02-09 DIAGNOSIS — Z6838 Body mass index (BMI) 38.0-38.9, adult: Secondary | ICD-10-CM | POA: Insufficient documentation

## 2015-02-09 DIAGNOSIS — N186 End stage renal disease: Secondary | ICD-10-CM | POA: Insufficient documentation

## 2015-02-09 DIAGNOSIS — Z79891 Long term (current) use of opiate analgesic: Secondary | ICD-10-CM | POA: Insufficient documentation

## 2015-02-09 DIAGNOSIS — K219 Gastro-esophageal reflux disease without esophagitis: Secondary | ICD-10-CM | POA: Diagnosis not present

## 2015-02-09 DIAGNOSIS — Z79899 Other long term (current) drug therapy: Secondary | ICD-10-CM | POA: Diagnosis not present

## 2015-02-09 DIAGNOSIS — Z7951 Long term (current) use of inhaled steroids: Secondary | ICD-10-CM | POA: Diagnosis not present

## 2015-02-09 HISTORY — DX: Essential (primary) hypertension: I10

## 2015-02-09 LAB — POCT I-STAT 4, (NA,K, GLUC, HGB,HCT)
Glucose, Bld: 90 mg/dL (ref 65–99)
HCT: 50 % (ref 39.0–52.0)
Hemoglobin: 17 g/dL (ref 13.0–17.0)
Potassium: 6.4 mmol/L (ref 3.5–5.1)
Sodium: 133 mmol/L — ABNORMAL LOW (ref 135–145)

## 2015-02-09 LAB — SURGICAL PCR SCREEN
MRSA, PCR: NEGATIVE
Staphylococcus aureus: NEGATIVE

## 2015-02-09 LAB — POTASSIUM: Potassium: 6.9 mmol/L (ref 3.5–5.1)

## 2015-02-09 SURGERY — REVISION OF ARTERIOVENOUS GORETEX GRAFT
Anesthesia: Choice | Site: Thigh | Laterality: Left

## 2015-02-09 MED ORDER — MIDAZOLAM HCL 2 MG/2ML IJ SOLN
INTRAMUSCULAR | Status: AC
  Filled 2015-02-09: qty 2

## 2015-02-09 MED ORDER — PROPOFOL 10 MG/ML IV BOLUS
INTRAVENOUS | Status: AC
  Filled 2015-02-09: qty 20

## 2015-02-09 MED ORDER — FENTANYL CITRATE (PF) 250 MCG/5ML IJ SOLN
INTRAMUSCULAR | Status: AC
  Filled 2015-02-09: qty 5

## 2015-02-09 MED ORDER — MUPIROCIN 2 % EX OINT
1.0000 "application " | TOPICAL_OINTMENT | Freq: Once | CUTANEOUS | Status: AC
  Administered 2015-02-09: 1 via TOPICAL
  Filled 2015-02-09: qty 22

## 2015-02-09 NOTE — Anesthesia Preprocedure Evaluation (Deleted)
Anesthesia Evaluation  Patient identified by MRN, date of birth, ID band Patient awake    Reviewed: Allergy & Precautions, H&P , NPO status , Patient's Chart, lab work & pertinent test results  Airway Mallampati: II  TM Distance: >3 FB Neck ROM: full    Dental no notable dental hx. (+) Dental Advisory Given, Teeth Intact   Pulmonary neg pulmonary ROS, shortness of breath and with exertion,    Pulmonary exam normal breath sounds clear to auscultation       Cardiovascular hypertension, Pt. on medications Normal cardiovascular exam Rhythm:regular Rate:Normal     Neuro/Psych  Headaches, Seizures -, Well Controlled,  Anxiety negative psych ROS   GI/Hepatic negative GI ROS, Neg liver ROS, GERD  Medicated and Controlled,  Endo/Other  negative endocrine ROSHypothyroidism   Renal/GU ESRFRenal disease  negative genitourinary   Musculoskeletal   Abdominal   Peds  Hematology negative hematology ROS (+)   Anesthesia Other Findings   Reproductive/Obstetrics negative OB ROS                             Anesthesia Physical Anesthesia Plan  ASA: IV  Anesthesia Plan:    Post-op Pain Management:    Induction:   Airway Management Planned:   Additional Equipment:   Intra-op Plan:   Post-operative Plan:   Informed Consent: I have reviewed the patients History and Physical, chart, labs and discussed the procedure including the risks, benefits and alternatives for the proposed anesthesia with the patient or authorized representative who has indicated his/her understanding and acceptance.   Dental Advisory Given  Plan Discussed with: CRNA and Surgeon  Anesthesia Plan Comments:         Anesthesia Quick Evaluation

## 2015-02-09 NOTE — Progress Notes (Signed)
Lab called with a critical non hemolyzed potassium of 6.9. Dr. Landry Dyke notified.

## 2015-02-09 NOTE — Progress Notes (Signed)
Pt had lunch and rested comfortably until [place] arrived.  Wheeled out to [facility] with [employer]. Discharge instructions provided to pt by MD.

## 2015-02-09 NOTE — Progress Notes (Signed)
Per Dr. Scot Dock discharge home report given to K. Kensmoe.

## 2015-02-09 NOTE — Progress Notes (Signed)
Patient aware that he is going to be cancelled due to elevated potassium. I have spoken to Dr. Scot Dock regarding same and once he sees patient he will determine next steps.

## 2015-02-10 MED ORDER — DEXTROSE 5 % IV SOLN
1.5000 g | INTRAVENOUS | Status: AC
  Administered 2015-02-11: 1.5 g via INTRAVENOUS
  Filled 2015-02-10: qty 1.5

## 2015-02-11 ENCOUNTER — Ambulatory Visit (HOSPITAL_COMMUNITY): Payer: Medicare Other | Admitting: Anesthesiology

## 2015-02-11 ENCOUNTER — Encounter (HOSPITAL_COMMUNITY): Payer: Self-pay | Admitting: Surgery

## 2015-02-11 ENCOUNTER — Ambulatory Visit (HOSPITAL_COMMUNITY)
Admission: RE | Admit: 2015-02-11 | Discharge: 2015-02-11 | Disposition: A | Discharge status: Home / Self Care | Payer: Medicare Other | Source: Ambulatory Visit | Attending: Vascular Surgery | Admitting: Vascular Surgery

## 2015-02-11 ENCOUNTER — Encounter (HOSPITAL_COMMUNITY)
Admission: RE | Disposition: A | Discharge status: Home / Self Care | Payer: Self-pay | Source: Ambulatory Visit | Attending: Vascular Surgery

## 2015-02-11 DIAGNOSIS — K219 Gastro-esophageal reflux disease without esophagitis: Secondary | ICD-10-CM | POA: Diagnosis not present

## 2015-02-11 DIAGNOSIS — Z7951 Long term (current) use of inhaled steroids: Secondary | ICD-10-CM | POA: Diagnosis not present

## 2015-02-11 DIAGNOSIS — I120 Hypertensive chronic kidney disease with stage 5 chronic kidney disease or end stage renal disease: Secondary | ICD-10-CM | POA: Insufficient documentation

## 2015-02-11 DIAGNOSIS — Z7982 Long term (current) use of aspirin: Secondary | ICD-10-CM | POA: Insufficient documentation

## 2015-02-11 DIAGNOSIS — T82898A Other specified complication of vascular prosthetic devices, implants and grafts, initial encounter: Secondary | ICD-10-CM | POA: Diagnosis present

## 2015-02-11 DIAGNOSIS — Z87442 Personal history of urinary calculi: Secondary | ICD-10-CM | POA: Diagnosis not present

## 2015-02-11 DIAGNOSIS — Z936 Other artificial openings of urinary tract status: Secondary | ICD-10-CM | POA: Insufficient documentation

## 2015-02-11 DIAGNOSIS — N186 End stage renal disease: Secondary | ICD-10-CM

## 2015-02-11 DIAGNOSIS — Z992 Dependence on renal dialysis: Secondary | ICD-10-CM | POA: Diagnosis not present

## 2015-02-11 DIAGNOSIS — G4700 Insomnia, unspecified: Secondary | ICD-10-CM | POA: Insufficient documentation

## 2015-02-11 DIAGNOSIS — E669 Obesity, unspecified: Secondary | ICD-10-CM | POA: Insufficient documentation

## 2015-02-11 DIAGNOSIS — E039 Hypothyroidism, unspecified: Secondary | ICD-10-CM | POA: Diagnosis not present

## 2015-02-11 DIAGNOSIS — Q059 Spina bifida, unspecified: Secondary | ICD-10-CM | POA: Insufficient documentation

## 2015-02-11 DIAGNOSIS — F419 Anxiety disorder, unspecified: Secondary | ICD-10-CM | POA: Insufficient documentation

## 2015-02-11 DIAGNOSIS — Z79899 Other long term (current) drug therapy: Secondary | ICD-10-CM | POA: Insufficient documentation

## 2015-02-11 DIAGNOSIS — Z6838 Body mass index (BMI) 38.0-38.9, adult: Secondary | ICD-10-CM | POA: Insufficient documentation

## 2015-02-11 DIAGNOSIS — Z79891 Long term (current) use of opiate analgesic: Secondary | ICD-10-CM | POA: Diagnosis not present

## 2015-02-11 HISTORY — PX: REVISION OF ARTERIOVENOUS GORETEX GRAFT: SHX6073

## 2015-02-11 LAB — POCT I-STAT 4, (NA,K, GLUC, HGB,HCT)
Glucose, Bld: 86 mg/dL (ref 65–99)
HCT: 48 % (ref 39.0–52.0)
Hemoglobin: 16.3 g/dL (ref 13.0–17.0)
Potassium: 4.3 mmol/L (ref 3.5–5.1)
Sodium: 137 mmol/L (ref 135–145)

## 2015-02-11 SURGERY — REVISION OF ARTERIOVENOUS GORETEX GRAFT
Anesthesia: General | Site: Thigh | Laterality: Left

## 2015-02-11 MED ORDER — PROPOFOL 10 MG/ML IV BOLUS
INTRAVENOUS | Status: AC
  Filled 2015-02-11: qty 20

## 2015-02-11 MED ORDER — PHENYLEPHRINE HCL 10 MG/ML IJ SOLN
10.0000 mg | INTRAVENOUS | Status: DC | PRN
  Administered 2015-02-11: 50 ug/min via INTRAVENOUS

## 2015-02-11 MED ORDER — MIDAZOLAM HCL 2 MG/2ML IJ SOLN
INTRAMUSCULAR | Status: AC
  Filled 2015-02-11: qty 2

## 2015-02-11 MED ORDER — CHLORHEXIDINE GLUCONATE CLOTH 2 % EX PADS: 6.0000 | MEDICATED_PAD | Freq: Once | CUTANEOUS | Status: DC

## 2015-02-11 MED ORDER — LACTATED RINGERS IV SOLN
INTRAVENOUS | Status: DC | PRN
  Administered 2015-02-11: 10:00:00 via INTRAVENOUS

## 2015-02-11 MED ORDER — PROPOFOL 10 MG/ML IV BOLUS
INTRAVENOUS | Status: DC | PRN
  Administered 2015-02-11: 200 mg via INTRAVENOUS

## 2015-02-11 MED ORDER — PHENYLEPHRINE HCL 10 MG/ML IJ SOLN
INTRAMUSCULAR | Status: DC | PRN
  Administered 2015-02-11: 80 ug via INTRAVENOUS
  Administered 2015-02-11: 120 ug via INTRAVENOUS

## 2015-02-11 MED ORDER — SODIUM CHLORIDE 0.9 % IV SOLN
INTRAVENOUS | Status: DC
  Administered 2015-02-11: 10:00:00 via INTRAVENOUS

## 2015-02-11 MED ORDER — LIDOCAINE HCL (CARDIAC) 20 MG/ML IV SOLN
INTRAVENOUS | Status: DC | PRN
Start: 2015-02-11 — End: 2015-02-11
  Administered 2015-02-11: 40 mg via INTRAVENOUS

## 2015-02-11 MED ORDER — FENTANYL CITRATE (PF) 250 MCG/5ML IJ SOLN
INTRAMUSCULAR | Status: AC
  Filled 2015-02-11: qty 5

## 2015-02-11 MED ORDER — 0.9 % SODIUM CHLORIDE (POUR BTL) OPTIME
TOPICAL | Status: DC | PRN
  Administered 2015-02-11: 1000 mL

## 2015-02-11 MED ORDER — FENTANYL CITRATE (PF) 100 MCG/2ML IJ SOLN
INTRAMUSCULAR | Status: DC
Start: 2015-02-11 — End: 2015-02-11
  Filled 2015-02-11: qty 2

## 2015-02-11 MED ORDER — OXYCODONE HCL 5 MG PO TABS: 5.0000 mg | ORAL_TABLET | Freq: Once | ORAL | Status: DC | PRN

## 2015-02-11 MED ORDER — LIDOCAINE HCL (CARDIAC) 20 MG/ML IV SOLN
INTRAVENOUS | Status: AC
  Filled 2015-02-11: qty 5

## 2015-02-11 MED ORDER — ACETAMINOPHEN 160 MG/5ML PO SOLN
325.0000 mg | ORAL | Status: DC | PRN
  Filled 2015-02-11: qty 20.3

## 2015-02-11 MED ORDER — GLYCOPYRROLATE 0.2 MG/ML IJ SOLN
INTRAMUSCULAR | Status: AC
  Filled 2015-02-11: qty 4

## 2015-02-11 MED ORDER — ONDANSETRON HCL 4 MG/2ML IJ SOLN
INTRAMUSCULAR | Status: AC
  Filled 2015-02-11: qty 4

## 2015-02-11 MED ORDER — OXYCODONE-ACETAMINOPHEN 10-325 MG PO TABS: 1.0000 | ORAL_TABLET | ORAL | Status: DC | PRN

## 2015-02-11 MED ORDER — ONDANSETRON HCL 4 MG/2ML IJ SOLN
INTRAMUSCULAR | Status: DC | PRN
  Administered 2015-02-11: 4 mg via INTRAVENOUS

## 2015-02-11 MED ORDER — OXYCODONE HCL 5 MG/5ML PO SOLN: 5.0000 mg | Freq: Once | ORAL | Status: DC | PRN

## 2015-02-11 MED ORDER — ACETAMINOPHEN 325 MG PO TABS: 325.0000 mg | ORAL_TABLET | ORAL | Status: DC | PRN

## 2015-02-11 MED ORDER — NEOSTIGMINE METHYLSULFATE 10 MG/10ML IV SOLN
INTRAVENOUS | Status: AC
  Filled 2015-02-11: qty 1

## 2015-02-11 MED ORDER — FENTANYL CITRATE (PF) 100 MCG/2ML IJ SOLN
25.0000 ug | INTRAMUSCULAR | Status: DC | PRN
  Administered 2015-02-11: 50 ug via INTRAVENOUS
  Administered 2015-02-11 (×2): 25 ug via INTRAVENOUS

## 2015-02-11 MED ORDER — LIDOCAINE-EPINEPHRINE (PF) 1 %-1:200000 IJ SOLN
INTRAMUSCULAR | Status: DC | PRN
  Administered 2015-02-11: 2 mL

## 2015-02-11 MED ORDER — MIDAZOLAM HCL 5 MG/5ML IJ SOLN
INTRAMUSCULAR | Status: DC | PRN
  Administered 2015-02-11: 2 mg via INTRAVENOUS

## 2015-02-11 MED ORDER — LIDOCAINE-EPINEPHRINE (PF) 1 %-1:200000 IJ SOLN
INTRAMUSCULAR | Status: AC
  Filled 2015-02-11: qty 30

## 2015-02-11 SURGICAL SUPPLY — 33 items
CANISTER SUCTION 2500CC (MISCELLANEOUS) ×2 IMPLANT
CLIP TI MEDIUM 6 (CLIP) ×2 IMPLANT
CLIP TI WIDE RED SMALL 6 (CLIP) ×2 IMPLANT
DECANTER SPIKE VIAL GLASS SM (MISCELLANEOUS) ×2 IMPLANT
ELECT REM PT RETURN 9FT ADLT (ELECTROSURGICAL) ×2
ELECTRODE REM PT RTRN 9FT ADLT (ELECTROSURGICAL) ×1 IMPLANT
GAUZE SPONGE 2X2 8PLY STRL LF (GAUZE/BANDAGES/DRESSINGS) ×1 IMPLANT
GEL ULTRASOUND 20GR AQUASONIC (MISCELLANEOUS) IMPLANT
GLOVE BIOGEL PI IND STRL 7.0 (GLOVE) ×1 IMPLANT
GLOVE BIOGEL PI IND STRL 7.5 (GLOVE) ×1 IMPLANT
GLOVE BIOGEL PI INDICATOR 7.0 (GLOVE) ×1
GLOVE BIOGEL PI INDICATOR 7.5 (GLOVE) ×1
GLOVE SS BIOGEL STRL SZ 7 (GLOVE) ×1 IMPLANT
GLOVE SUPERSENSE BIOGEL SZ 7 (GLOVE) ×1
GLOVE SURG SS PI 7.0 STRL IVOR (GLOVE) ×2 IMPLANT
GLOVE SURG SS PI 7.5 STRL IVOR (GLOVE) ×2 IMPLANT
GOWN STRL REUS W/ TWL LRG LVL3 (GOWN DISPOSABLE) ×3 IMPLANT
GOWN STRL REUS W/TWL LRG LVL3 (GOWN DISPOSABLE) ×3
KIT BASIN OR (CUSTOM PROCEDURE TRAY) ×2 IMPLANT
KIT ROOM TURNOVER OR (KITS) ×2 IMPLANT
LIQUID BAND (GAUZE/BANDAGES/DRESSINGS) ×2 IMPLANT
NEEDLE HYPO 25GX1X1/2 BEV (NEEDLE) ×2 IMPLANT
NS IRRIG 1000ML POUR BTL (IV SOLUTION) ×2 IMPLANT
PACK CV ACCESS (CUSTOM PROCEDURE TRAY) ×2 IMPLANT
PAD ARMBOARD 7.5X6 YLW CONV (MISCELLANEOUS) ×4 IMPLANT
SPONGE GAUZE 2X2 STER 10/PKG (GAUZE/BANDAGES/DRESSINGS) ×1
SUT ETHILON 3 0 PS 1 (SUTURE) ×4 IMPLANT
SUT PROLENE 6 0 BV (SUTURE) ×4 IMPLANT
SUT VIC AB 3-0 SH 27 (SUTURE) ×2
SUT VIC AB 3-0 SH 27X BRD (SUTURE) ×2 IMPLANT
TAPE CLOTH SURG 4X10 WHT LF (GAUZE/BANDAGES/DRESSINGS) ×2 IMPLANT
UNDERPAD 30X30 INCONTINENT (UNDERPADS AND DIAPERS) ×2 IMPLANT
WATER STERILE IRR 1000ML POUR (IV SOLUTION) ×2 IMPLANT

## 2015-02-11 NOTE — Anesthesia Postprocedure Evaluation (Signed)
Anesthesia Post Note  Patient: [name]  Procedure(s) Performed: Procedure(s) (LRB): EXCISION OF SMALL SEGMENT OF EXPOSED LEFT THIGH NON FUNCTIONING  ARTERIOVENOUS GORETEX GRAFT (Left)  Patient location during evaluation: PACU Anesthesia Type: General Level of consciousness: awake Pain management: pain level controlled Vital Signs Assessment: post-procedure vital signs reviewed and stable Cardiovascular status: stable Anesthetic complications: no    Last Vitals:  Filed Vitals:   02/11/15 1200 02/11/15 1215  BP: 110/68 112/64  Pulse: 70 25  Temp:  36.6 C  Resp: 15 27    Last Pain:  Filed Vitals:   02/11/15 1232  PainSc: 8                  EDWARDS,Mandeep Kiser

## 2015-02-11 NOTE — Anesthesia Preprocedure Evaluation (Signed)
Anesthesia Evaluation  Patient identified by MRN, date of birth, ID band Patient awake    Reviewed: Allergy & Precautions, NPO status , Patient's Chart, lab work & pertinent test results  History of Anesthesia Complications Negative for: history of anesthetic complications  Airway Mallampati: III  TM Distance: >3 FB Neck ROM: Full    Dental  (+) Teeth Intact   Pulmonary neg shortness of breath, neg sleep apnea, neg COPD, neg recent URI, neg PE   breath sounds clear to auscultation       Cardiovascular hypertension, Pt. on medications and Pt. on home beta blockers  Rhythm:Regular     Neuro/Psych  Headaches, neg Seizures PSYCHIATRIC DISORDERS Anxiety    GI/Hepatic GERD  Medicated and Controlled,  Endo/Other  Hypothyroidism   Renal/GU ESRF and DialysisRenal disease     Musculoskeletal   Abdominal   Peds  Hematology  (+) anemia ,   Anesthesia Other Findings   Reproductive/Obstetrics                             Anesthesia Physical Anesthesia Plan  ASA: III  Anesthesia Plan: General   Post-op Pain Management:    Induction: Intravenous  Airway Management Planned: LMA  Additional Equipment: None  Intra-op Plan:   Post-operative Plan: Extubation in OR  Informed Consent: I have reviewed the patients History and Physical, chart, labs and discussed the procedure including the risks, benefits and alternatives for the proposed anesthesia with the patient or authorized representative who has indicated his/her understanding and acceptance.   Dental advisory given  Plan Discussed with: CRNA and Surgeon  Anesthesia Plan Comments:         Anesthesia Quick Evaluation

## 2015-02-11 NOTE — Interval H&P Note (Signed)
History and Physical Interval Note:  02/11/2015 9:39 AM  [name]  has presented today for surgery, with the diagnosis of dry erosion nonfunctioning portion left thigh AVG  T82.898A End Stage Renal Disease  N18.6  The various methods of treatment have been discussed with the patient and family. After consideration of risks, benefits and other options for treatment, the patient has consented to  Procedure(s): EXCISION OF EXPOSED LEFT THIGH ARTERIOVENOUS GORETEX GRAFT (Left) as a surgical intervention .  The patient's history has been reviewed, patient examined, no change in status, stable for surgery.  I have reviewed the patient's chart and labs.  Questions were answered to the patient's satisfaction.     Tinnie Gens

## 2015-02-11 NOTE — Op Note (Signed)
OPERATIVE REPORT  Date of Surgery: 02/11/2015  Surgeon: Tinnie Gens, MD  Assistant: nurse  Pre-op Diagnosis: dry erosion nonfunctioning portion left thigh AVG  T82.898A End Stage Renal Disease  N18.6  Post-op Diagnosis: ssame  Procedure: Procedure(s): EXCISION OF EXPOSED LEFT THIGH ARTERIOVENOUS GORETEX GRAFT  Anesthesia: LMA  EBL: Minimal  Complications: None  Procedure Details: The patient was taken down patent and placed in supine position at which time satisfactory general-LMA anesthesia was administered. The left thigh up to the inguinal area was prepped Betadine scrub and solution draped in routine sterile manner. There was a functioning loop  Thigh graft medially located and an old segment of nonfunctional graft laterallywas exposed. This had been partially removed in the past. This was infiltrated forms of Xylocaine with epinephrine  Short longitudinal incision was made more proximally to identify the more proximal location of the graft to examine it and see if it appeared infected this area. The grafted actually been removed up to a point more proximal to this area and there was no evidence of purulence or infection. The short incision was closed with interrupted 3-0 Vicryl and 3-0 nylon vertical mattress sutures for the skin. The graft was then  Traced proximally where it was exposed and there was about a 3-4 cm segment remaining which was completely removed. There was no purulence within the graft. After debriding the skin edges this was thoroughly irrigated with saline and was closed  With 3 interrupted 3-0 nylon vertical mattress sutures. The wound appeared clean. Sterile dressing was applied and patient taken to recovery room in satisfactory condition   Tinnie Gens, MD 02/11/2015 10:56 AM

## 2015-02-11 NOTE — Transfer of Care (Signed)
Immediate Anesthesia Transfer of Care Note  Patient: [name]  Procedure(s) Performed: Procedure(s): EXCISION OF SMALL SEGMENT OF EXPOSED LEFT THIGH NON FUNCTIONING  ARTERIOVENOUS GORETEX GRAFT (Left)  Patient Location: PACU  Anesthesia Type:General  Level of Consciousness: awake, alert , oriented and patient cooperative  Airway & Oxygen Therapy: Patient Spontanous Breathing and Patient connected to nasal cannula oxygen  Post-op Assessment: Report given to RN, Post -op Vital signs reviewed and stable, Patient moving all extremities and Patient moving all extremities X 4  Post vital signs: Reviewed and stable  Last Vitals:  Filed Vitals:   02/11/15 0845  BP: 106/59  Pulse: 74  Temp: 37.1 C  Resp: 20    Complications: No apparent anesthesia complications

## 2015-02-11 NOTE — H&P (View-Only) (Signed)
Established Dialysis Access  History of Present Illness  [name] is a 50 y.o. (1965/12/20) male patient of Dr. Donnetta Hutching who returns today with c/o small dry erosion of non functioning portion of left thigh graft. Pt denies pain, denies drainage from this erosion, denies fever or chills. He dialyzes M-W-F.  Dr. Donnetta Hutching last saw pt on 07/08/13 for left thigh AV Gore-Tex graft issues placed in October 2015. Pt has had multiple episodes of graft erosion and graft replacement from his left thigh graft. He presented that day with concern regarding an area of the lateralmost aspect of his incisions. There was no functional graft under this. He had a good thrill in his graft and there was some area of graft degeneration throughout his left femoral loop. The area of concern was actually lateral to this. He does have adequate dialysis through the graft. The area of concern had minimal erythema. There was an eschar present. This was debrided in the office as a segment of nonfunctional cortex graft that has eroded to the skin. Dr. Donnetta Hutching debrided this back to below the skin level.  Erosion of small segment of nonfunctional Gortex graft in his left femoral region. Dr. Donnetta Hutching felt this had a high likelihood of resolving the problem and explained pt may continue to have erythema and ongoing issues with this. If so he would require surgical excision of this in the operating room. Pt was to continue to monitor this at the dialysis center and notify us should this continues to smolder.     Past Medical History  Diagnosis Date  . End stage renal failure on dialysis ([place])   . Spina bifida ([place])   . Seizures ([place])     last 1998  . GERD (gastroesophageal reflux disease)   . Insomnia   . Anxiety   . Hypothyroidism   . Anemia   . Headache(784.0)   . Shortness of breath     rarely  . Kidney stones     Social History Social History  Substance Use Topics  . Smoking status: Never Smoker   . Smokeless  tobacco: Never Used  . Alcohol Use: No    Family History Family History  Problem Relation Age of Onset  . Diabetes Mother   . Hypertension Mother   . Heart disease Mother     before age 30  . Diabetes Father   . Heart attack Father     X's 3  . Diabetes Sister   . Bipolar disorder Sister     Surgical History Past Surgical History  Procedure Laterality Date  . Insertion of dialysis catheter    . Knee surgery Right   . Lithotripsy      x 3  . Av fistula placement Left 06/27/2012    Procedure: EXPLORATORY LEFT THY-GRAFT PSEUDO-ANEURYSM;  Surgeon: Conrad , MD;  Location: [facility];  Service: Vascular;  Laterality: Left;  Revision of left Arteriovenus gortex graft in thigh.  . Appendectomy    . Colonoscopy    . Arteriovenous graft placement Left 10/16/2012    left femoral goretex graft         Dr Donnetta Hutching  . Revision of arteriovenous goretex graft Left 10/16/2012    Procedure: REVISION OF LEFT FEMORAL LOOP ARTERIOVENOUS GORETEX GRAFT;  Surgeon: Rosetta Posner, MD;  Location: [facility];  Service: Vascular;  Laterality: Left;  . Incision and drainage Left 10/16/2012    Procedure: INCISION AND Debridement left thigh graft;  Surgeon: Arvilla Meres  Early, MD;  [facility];  Service: Vascular;  Laterality: Left;    Allergies  Allergen Reactions  . Hydrocodone Other (See Comments)    Pt states that after 3 weeks of taking this medication he will began to "twitch"  . Contrast Media [Iodinated Diagnostic Agents] Nausea And Vomiting    Oral dye causes vomiting, IV dye is okay  . Furadantin [Nitrofurantoin]     unknown  . Mandelamine [Methenamine]     unknown  . Noroxin [Norfloxacin]     unknown  . Sulfa Antibiotics     unknown    Current Outpatient Prescriptions  Medication Sig Dispense Refill  . acetaminophen (TYLENOL) 325 MG tablet Take 650 mg by mouth 3 (three) times a week. Takes at dialysis treatments (m/W/F)    . aspirin 325 MG tablet Take 325 mg by mouth daily.    Marland Kitchen azelastine  (ASTELIN) 137 MCG/SPRAY nasal spray Place 1 spray into the nose 2 (two) times daily. Use in each nostril as directed    . calcium acetate (PHOSLO) 667 MG capsule Take 1,334-2,668 mg by mouth 3 (three) times daily with meals. 4 capsules with meals, 2 with snacks    . Cinacalcet HCl (SENSIPAR PO) Take by mouth. Unknown dose patient has not taken this since early spring due to insurance issues. But patient requested I leave this on this list because he should be taking.    . DiphenhydrAMINE HCl (BENADRYL ALLERGY PO) Take by mouth 3 (three) times a week. Three times weekly at dialysis (M/W/F)    . famotidine (PEPCID) 40 MG tablet Take 40 mg by mouth daily.    . fluticasone (FLONASE) 50 MCG/ACT nasal spray Place 2 sprays into the nose daily.    . furosemide (LASIX) 80 MG tablet Take 80 mg by mouth 2 (two) times daily.    Marland Kitchen levothyroxine (SYNTHROID, LEVOTHROID) 125 MCG tablet Take 125 mcg by mouth daily before breakfast.    . LORazepam (ATIVAN) 0.5 MG tablet Take 0.5 mg by mouth daily.    Marland Kitchen oxyCODONE-acetaminophen (PERCOCET) 10-325 MG per tablet Take 1 tablet by mouth every 4 (four) hours as needed for pain. 30 tablet 0  . promethazine (PHENERGAN) 25 MG tablet Take 25 mg by mouth every 6 (six) hours as needed for nausea.    . sodium polystyrene (KAYEXALATE) powder Take 15 g by mouth 2 (two) times a week. Saturday and Sunday    . traZODone (DESYREL) 100 MG tablet Take 100 mg by mouth at bedtime.    Marland Kitchen zolpidem (AMBIEN) 10 MG tablet Take 10 mg by mouth at bedtime as needed for sleep.     No current facility-administered medications for this visit.     REVIEW OF SYSTEMS: see HPI for pertinent positives and negatives    PHYSICAL EXAMINATION:  Filed Vitals:   02/02/15 1433  BP: 95/58  Pulse: 74  Temp: 98.6 F (37 C)  TempSrc: Oral  Height: 5\' 11"  (1.803 m)  Weight: 275 lb 5.7 oz (124.9 kg)  SpO2: 95%   Body mass index is 38.42 kg/(m^2).   General: Obese male appears his stated age. He is  seated in a wheelchair.  HEENT:  No gross abnormalities Pulmonary: Respirations are non-labored Abdomen: Soft and non-tender. Musculoskeletal: There are no major deformities.   Neurologic: No focal weakness or paresthesias are detected. Skin: There are no ulcer or rashes noted. Psychiatric: The patient has normal affect. Cardiovascular: There is a regular rate and rhythm. Left thigh AV graft with  palpable thrill and audible bruit. No bruit or thrill over the small dry erosion on his left thigh, lateral to the AVG, no erythema, no drainage, no swelling.    Medical Decision Making  Andra Motton is a 50 y.o. male who is s/p eft thigh AV Gore-Tex graft placed in October 2015. He has a small dry erosion to the skin from a portion of his non functioning AV graft. Dr. Donnetta Hutching spoke with and examined pt. Dr. Donnetta Hutching scheduled pt for revision of left thigh non functional graft section.    Alanea Woolridge, Sharmon Leyden, RN, MSN, FNP-C [facility] Office: [phone]  02/02/2015, 2:37 PM  Clinic MD: Early

## 2015-02-12 ENCOUNTER — Encounter (HOSPITAL_COMMUNITY): Payer: Self-pay | Admitting: Vascular Surgery

## 2015-02-12 ENCOUNTER — Telehealth: Payer: Self-pay | Admitting: Vascular Surgery

## 2015-02-12 NOTE — Telephone Encounter (Signed)
Spoke with pt regarding appt, dpm

## 2015-02-12 NOTE — Telephone Encounter (Signed)
-----   Message from Denman George, RN sent at 02/11/2015 12:23 PM EST ----- Regarding: needs 4 wk. f/u with [facility]   ----- Message -----    From: Gabriel Earing, PA-C    Sent: 02/11/2015  11:19 AM      To: [facility] Charge Pool  S/p EXCISION OF EXPOSED LEFT THIGH ARTERIOVENOUS GORETEX GRAFT.  F/u with [facility] in 4 weeks.  He will have nylon sutures to remove at that time.  Thanks

## 2015-03-02 ENCOUNTER — Encounter: Payer: Self-pay | Admitting: Vascular Surgery

## 2015-03-09 ENCOUNTER — Encounter: Payer: Self-pay | Admitting: Vascular Surgery

## 2015-03-09 ENCOUNTER — Ambulatory Visit (INDEPENDENT_AMBULATORY_CARE_PROVIDER_SITE_OTHER): Payer: Medicare Other | Admitting: Vascular Surgery

## 2015-03-09 VITALS — BP 102/59 | HR 78 | Temp 97.7°F | Resp 16 | Ht 71.0 in | Wt 278.9 lb

## 2015-03-09 DIAGNOSIS — N186 End stage renal disease: Secondary | ICD-10-CM

## 2015-03-09 NOTE — Progress Notes (Signed)
Patient name: [name] MRN: [id number] DOB: Feb 19, 1965 Sex: male  REASON FOR VISIT: Postop follow-up.  HPI: [name] is a 50 y.o. male who presents for post-operative follow-up status post excision of exposed left thigh AV Gore-Tex graft on 02/11/2015 by Dr. Kellie Simmering. The patient denies any issues with his incision. He dialyzes on Mondays, Wednesdays and Fridays through his functional left thigh AV graft. He denies any issues with dialysis.  Current Outpatient Prescriptions  Medication Sig Dispense Refill  . acetaminophen (TYLENOL) 325 MG tablet Take 650 mg by mouth 3 (three) times a week. Takes at dialysis treatments (m/W/F)    . aspirin 325 MG tablet Take 325 mg by mouth daily.    Marland Kitchen azelastine (ASTELIN) 137 MCG/SPRAY nasal spray Place 1 spray into the nose 2 (two) times daily. Use in each nostril as directed    . calcium acetate (PHOSLO) 667 MG capsule Take 1,334-2,668 mg by mouth 3 (three) times daily with meals. 4 capsules with meals, 3 with snacks    . DiphenhydrAMINE HCl (BENADRYL ALLERGY PO) Take by mouth 3 (three) times a week. Three times weekly at dialysis (M/W/F)    . famotidine (PEPCID) 40 MG tablet Take 40 mg by mouth daily.    . fluticasone (FLONASE) 50 MCG/ACT nasal spray Place 2 sprays into the nose daily.    . furosemide (LASIX) 80 MG tablet Take 80 mg by mouth 2 (two) times daily.    Marland Kitchen levothyroxine (SYNTHROID, LEVOTHROID) 125 MCG tablet Take 125 mcg by mouth daily before breakfast.    . LORazepam (ATIVAN) 0.5 MG tablet Take 0.5 mg by mouth daily.    Marland Kitchen oxyCODONE-acetaminophen (PERCOCET) 10-325 MG tablet Take 1 tablet by mouth every 4 (four) hours as needed for pain. 30 tablet 0  . promethazine (PHENERGAN) 25 MG tablet Take 25 mg by mouth every 6 (six) hours as needed for nausea.    . sodium polystyrene (KAYEXALATE) powder Take 15 g by mouth 2 (two) times a week. Saturday and Sunday    . traZODone (DESYREL) 100 MG tablet Take 100 mg by mouth at bedtime.    Marland Kitchen  zolpidem (AMBIEN) 10 MG tablet Take 10 mg by mouth at bedtime as needed for sleep.    . Cinacalcet HCl (SENSIPAR PO) Take by mouth. Reported on 03/09/2015     No current facility-administered medications for this visit.    REVIEW OF SYSTEMS:  [X]  denotes positive finding, [ ]  denotes negative finding Cardiac  Comments:  Chest pain or chest pressure:    Shortness of breath upon exertion:    Short of breath when lying flat:    Irregular heart rhythm:    Constitutional    Fever or chills:      PHYSICAL EXAM: Filed Vitals:   03/09/15 1351  BP: 102/59  Pulse: 78  Temp: 97.7 F (36.5 C)  Resp: 16  Height: 5\' 11"  (1.803 m)  Weight: 278 lb 14.1 oz (126.5 kg)  SpO2: 98%    GENERAL: The patient is a well-nourished male, in no acute distress. The vital signs are documented above. VASCULAR: Lateral thigh incision with nylon sutures intact. No drainage. Functional thigh graft with palpable thrill.   MEDICAL ISSUES: Status post excision of exposed non-functional left thigh arteriovenous Gore-Tex graft  The patient's incisions are well-healed. Nylon sutures were removed today in the office. The patient currently dialyzes on Mondays, Wednesdays and Fridays via his left thigh AV graft. He has not had any issues with dialysis. He will  follow up on an as-needed basis.  Virgina Jock, PA-C [facility]  Agree with above assessment Wound well-healed No evidence recurrent infection Return to see [place] on when necessary basis

## 2015-04-12 DIAGNOSIS — L02212 Cutaneous abscess of back [any part, except buttock]: Secondary | ICD-10-CM | POA: Insufficient documentation

## 2016-01-12 DIAGNOSIS — R6883 Chills (without fever): Secondary | ICD-10-CM | POA: Insufficient documentation

## 2016-01-14 ENCOUNTER — Emergency Department (HOSPITAL_COMMUNITY): Payer: Medicare Other | Admitting: Anesthesiology

## 2016-01-14 ENCOUNTER — Inpatient Hospital Stay (HOSPITAL_COMMUNITY)
Admission: EM | Admit: 2016-01-14 | Discharge: 2016-01-22 | DRG: 270 | Disposition: A | Discharge status: Home / Self Care | Payer: Medicare Other | Attending: Vascular Surgery | Admitting: Vascular Surgery

## 2016-01-14 ENCOUNTER — Encounter (HOSPITAL_COMMUNITY): Payer: Self-pay | Admitting: Emergency Medicine

## 2016-01-14 ENCOUNTER — Encounter (HOSPITAL_COMMUNITY)
Admission: EM | Disposition: A | Discharge status: Home / Self Care | Payer: Self-pay | Source: Home / Self Care | Attending: Vascular Surgery

## 2016-01-14 DIAGNOSIS — Z79899 Other long term (current) drug therapy: Secondary | ICD-10-CM

## 2016-01-14 DIAGNOSIS — R05 Cough: Secondary | ICD-10-CM

## 2016-01-14 DIAGNOSIS — I82422 Acute embolism and thrombosis of left iliac vein: Secondary | ICD-10-CM | POA: Diagnosis present

## 2016-01-14 DIAGNOSIS — I120 Hypertensive chronic kidney disease with stage 5 chronic kidney disease or end stage renal disease: Secondary | ICD-10-CM | POA: Diagnosis present

## 2016-01-14 DIAGNOSIS — Z992 Dependence on renal dialysis: Secondary | ICD-10-CM | POA: Diagnosis not present

## 2016-01-14 DIAGNOSIS — I745 Embolism and thrombosis of iliac artery: Secondary | ICD-10-CM | POA: Diagnosis present

## 2016-01-14 DIAGNOSIS — Z79891 Long term (current) use of opiate analgesic: Secondary | ICD-10-CM | POA: Diagnosis not present

## 2016-01-14 DIAGNOSIS — D649 Anemia, unspecified: Secondary | ICD-10-CM | POA: Diagnosis present

## 2016-01-14 DIAGNOSIS — F419 Anxiety disorder, unspecified: Secondary | ICD-10-CM | POA: Diagnosis present

## 2016-01-14 DIAGNOSIS — T829XXA Unspecified complication of cardiac and vascular prosthetic device, implant and graft, initial encounter: Secondary | ICD-10-CM

## 2016-01-14 DIAGNOSIS — N2581 Secondary hyperparathyroidism of renal origin: Secondary | ICD-10-CM | POA: Diagnosis present

## 2016-01-14 DIAGNOSIS — Z7982 Long term (current) use of aspirin: Secondary | ICD-10-CM

## 2016-01-14 DIAGNOSIS — T8249XA Other complication of vascular dialysis catheter, initial encounter: Secondary | ICD-10-CM | POA: Diagnosis present

## 2016-01-14 DIAGNOSIS — N186 End stage renal disease: Secondary | ICD-10-CM | POA: Diagnosis present

## 2016-01-14 DIAGNOSIS — E039 Hypothyroidism, unspecified: Secondary | ICD-10-CM | POA: Diagnosis present

## 2016-01-14 DIAGNOSIS — T82818A Embolism of vascular prosthetic devices, implants and grafts, initial encounter: Secondary | ICD-10-CM | POA: Diagnosis present

## 2016-01-14 DIAGNOSIS — R058 Other specified cough: Secondary | ICD-10-CM

## 2016-01-14 DIAGNOSIS — K219 Gastro-esophageal reflux disease without esophagitis: Secondary | ICD-10-CM | POA: Diagnosis present

## 2016-01-14 DIAGNOSIS — Q059 Spina bifida, unspecified: Secondary | ICD-10-CM | POA: Diagnosis not present

## 2016-01-14 DIAGNOSIS — Y832 Surgical operation with anastomosis, bypass or graft as the cause of abnormal reaction of the patient, or of later complication, without mention of misadventure at the time of the procedure: Secondary | ICD-10-CM | POA: Diagnosis present

## 2016-01-14 DIAGNOSIS — T82868A Thrombosis of vascular prosthetic devices, implants and grafts, initial encounter: Secondary | ICD-10-CM | POA: Diagnosis not present

## 2016-01-14 DIAGNOSIS — M898X9 Other specified disorders of bone, unspecified site: Secondary | ICD-10-CM | POA: Diagnosis present

## 2016-01-14 DIAGNOSIS — R0602 Shortness of breath: Secondary | ICD-10-CM | POA: Diagnosis present

## 2016-01-14 HISTORY — PX: INSERTION OF ILIAC STENT: SHX6256

## 2016-01-14 HISTORY — PX: INTRAOPERATIVE ARTERIOGRAM: SHX5157

## 2016-01-14 HISTORY — PX: INSERTION OF DIALYSIS CATHETER: SHX1324

## 2016-01-14 HISTORY — PX: PERIPHERAL VASCULAR CATHETERIZATION: SHX172C

## 2016-01-14 HISTORY — PX: THROMBECTOMY W/ EMBOLECTOMY: SHX2507

## 2016-01-14 HISTORY — PX: REVISION OF ARTERIOVENOUS GORETEX GRAFT: SHX6073

## 2016-01-14 LAB — CBC WITH DIFFERENTIAL/PLATELET
Basophils Absolute: 0 10*3/uL (ref 0.0–0.1)
Basophils Relative: 0 %
Eosinophils Absolute: 0.1 10*3/uL (ref 0.0–0.7)
Eosinophils Relative: 2 %
HCT: 42 % (ref 39.0–52.0)
Hemoglobin: 13.4 g/dL (ref 13.0–17.0)
Lymphocytes Relative: 15 %
Lymphs Abs: 0.9 10*3/uL (ref 0.7–4.0)
MCH: 29.1 pg (ref 26.0–34.0)
MCHC: 31.9 g/dL (ref 30.0–36.0)
MCV: 91.3 fL (ref 78.0–100.0)
Monocytes Absolute: 0.6 10*3/uL (ref 0.1–1.0)
Monocytes Relative: 11 %
Neutro Abs: 4.3 10*3/uL (ref 1.7–7.7)
Neutrophils Relative %: 72 %
Platelets: 112 10*3/uL — ABNORMAL LOW (ref 150–400)
RBC: 4.6 MIL/uL (ref 4.22–5.81)
RDW: 16.9 % — ABNORMAL HIGH (ref 11.5–15.5)
WBC: 6 10*3/uL (ref 4.0–10.5)

## 2016-01-14 LAB — BASIC METABOLIC PANEL
Anion gap: 19 — ABNORMAL HIGH (ref 5–15)
BUN: 53 mg/dL — ABNORMAL HIGH (ref 6–20)
CO2: 23 mmol/L (ref 22–32)
Calcium: 8.7 mg/dL — ABNORMAL LOW (ref 8.9–10.3)
Chloride: 90 mmol/L — ABNORMAL LOW (ref 101–111)
Creatinine, Ser: 10.66 mg/dL — ABNORMAL HIGH (ref 0.61–1.24)
GFR calc Af Amer: 6 mL/min — ABNORMAL LOW (ref >60)
GFR calc non Af Amer: 5 mL/min — ABNORMAL LOW (ref >60)
Glucose, Bld: 84 mg/dL (ref 65–99)
Potassium: 4.8 mmol/L (ref 3.5–5.1)
Sodium: 132 mmol/L — ABNORMAL LOW (ref 135–145)

## 2016-01-14 LAB — PROTIME-INR
INR: 1.05
Prothrombin Time: 13.7 seconds (ref 11.4–15.2)

## 2016-01-14 SURGERY — THROMBECTOMY ARTERIOVENOUS GORE-TEX GRAFT
Anesthesia: General | Site: Neck | Laterality: Left

## 2016-01-14 MED ORDER — IOPAMIDOL (ISOVUE-300) INJECTION 61%
INTRAVENOUS | Status: AC
  Filled 2016-01-14: qty 50

## 2016-01-14 MED ORDER — IOPAMIDOL (ISOVUE-300) INJECTION 61%
INTRAVENOUS | Status: DC | PRN
  Administered 2016-01-14: 50 mL via INTRAVENOUS
  Administered 2016-01-14 (×3): 50 mL via INTRA_ARTERIAL

## 2016-01-14 MED ORDER — BISACODYL 10 MG RE SUPP: 10.0000 mg | Freq: Every day | RECTAL | Status: DC | PRN

## 2016-01-14 MED ORDER — ZOLPIDEM TARTRATE 5 MG PO TABS: 10.0000 mg | ORAL_TABLET | Freq: Every evening | ORAL | Status: DC | PRN

## 2016-01-14 MED ORDER — FENTANYL CITRATE (PF) 100 MCG/2ML IJ SOLN
INTRAMUSCULAR | Status: DC | PRN
  Administered 2016-01-14 (×12): 50 ug via INTRAVENOUS
  Administered 2016-01-14: 150 ug via INTRAVENOUS
  Administered 2016-01-14 (×2): 100 ug via INTRAVENOUS
  Administered 2016-01-14: 50 ug via INTRAVENOUS

## 2016-01-14 MED ORDER — ACETAMINOPHEN 325 MG PO TABS: 650.0000 mg | ORAL_TABLET | ORAL | Status: DC

## 2016-01-14 MED ORDER — FENTANYL CITRATE (PF) 250 MCG/5ML IJ SOLN
INTRAMUSCULAR | Status: AC
  Filled 2016-01-14: qty 5

## 2016-01-14 MED ORDER — ALBUMIN HUMAN 5 % IV SOLN
INTRAVENOUS | Status: DC | PRN
  Administered 2016-01-14: 18:00:00 via INTRAVENOUS

## 2016-01-14 MED ORDER — PHENOL 1.4 % MT LIQD: 1.0000 | OROMUCOSAL | Status: DC | PRN

## 2016-01-14 MED ORDER — HEPARIN SODIUM (PORCINE) 1000 UNIT/ML IJ SOLN
INTRAMUSCULAR | Status: AC
  Filled 2016-01-14: qty 2

## 2016-01-14 MED ORDER — HEMOSTATIC AGENTS (NO CHARGE) OPTIME
TOPICAL | Status: DC | PRN
  Administered 2016-01-14: 1 via TOPICAL

## 2016-01-14 MED ORDER — PHENYLEPHRINE HCL 10 MG/ML IJ SOLN
INTRAVENOUS | Status: DC | PRN
  Administered 2016-01-14: 100 ug/min via INTRAVENOUS

## 2016-01-14 MED ORDER — VANCOMYCIN HCL IN DEXTROSE 1-5 GM/200ML-% IV SOLN
INTRAVENOUS | Status: AC
  Filled 2016-01-14: qty 200

## 2016-01-14 MED ORDER — CALCIUM CHLORIDE 10 % IV SOLN
INTRAVENOUS | Status: DC | PRN
  Administered 2016-01-14: 200 mg via INTRAVENOUS
  Administered 2016-01-14: 100 mg via INTRAVENOUS
  Administered 2016-01-14: 200 mg via INTRAVENOUS

## 2016-01-14 MED ORDER — PHENYLEPHRINE HCL 10 MG/ML IJ SOLN
INTRAMUSCULAR | Status: DC | PRN
  Administered 2016-01-14: 40 ug via INTRAVENOUS

## 2016-01-14 MED ORDER — FUROSEMIDE 80 MG PO TABS
80.0000 mg | ORAL_TABLET | Freq: Two times a day (BID) | ORAL | Status: DC
  Administered 2016-01-15 – 2016-01-21 (×10): 80 mg via ORAL
  Filled 2016-01-14 (×11): qty 1

## 2016-01-14 MED ORDER — ONDANSETRON HCL 4 MG/2ML IJ SOLN: 4.0000 mg | Freq: Once | INTRAMUSCULAR | Status: DC | PRN

## 2016-01-14 MED ORDER — SODIUM CHLORIDE 0.9 % IV SOLN: 250.0000 mL | INTRAVENOUS | Status: DC | PRN

## 2016-01-14 MED ORDER — HEPARIN SODIUM (PORCINE) 1000 UNIT/ML IJ SOLN
INTRAMUSCULAR | Status: DC | PRN
Start: 2016-01-14 — End: 2016-01-14
  Administered 2016-01-14: 5000 [IU] via INTRAVENOUS
  Administered 2016-01-14: 4000 [IU] via INTRAVENOUS
  Administered 2016-01-14: 12000 [IU] via INTRAVENOUS

## 2016-01-14 MED ORDER — ONDANSETRON HCL 4 MG/2ML IJ SOLN
INTRAMUSCULAR | Status: AC
  Filled 2016-01-14: qty 2

## 2016-01-14 MED ORDER — ONDANSETRON HCL 4 MG/2ML IJ SOLN: 4.0000 mg | Freq: Four times a day (QID) | INTRAMUSCULAR | Status: DC | PRN

## 2016-01-14 MED ORDER — VANCOMYCIN HCL 1000 MG IV SOLR
INTRAVENOUS | Status: DC | PRN
  Administered 2016-01-14: 1000 mg via INTRAVENOUS

## 2016-01-14 MED ORDER — PROMETHAZINE HCL 25 MG PO TABS: 25.0000 mg | ORAL_TABLET | Freq: Four times a day (QID) | ORAL | Status: DC | PRN

## 2016-01-14 MED ORDER — MIDAZOLAM HCL 2 MG/2ML IJ SOLN
INTRAMUSCULAR | Status: AC
  Filled 2016-01-14: qty 2

## 2016-01-14 MED ORDER — OXYCODONE-ACETAMINOPHEN 10-325 MG PO TABS: 1.0000 | ORAL_TABLET | ORAL | Status: DC | PRN

## 2016-01-14 MED ORDER — LORAZEPAM 0.5 MG PO TABS
0.5000 mg | ORAL_TABLET | Freq: Every day | ORAL | Status: DC
  Administered 2016-01-15 – 2016-01-21 (×7): 0.5 mg via ORAL
  Filled 2016-01-14 (×7): qty 1

## 2016-01-14 MED ORDER — AZELASTINE HCL 0.1 % NA SOLN
1.0000 | Freq: Two times a day (BID) | NASAL | Status: DC
  Filled 2016-01-14 (×2): qty 30

## 2016-01-14 MED ORDER — SODIUM POLYSTYRENE SULFONATE PO POWD: 15.0000 g | ORAL | Status: DC

## 2016-01-14 MED ORDER — FAMOTIDINE 20 MG PO TABS
40.0000 mg | ORAL_TABLET | Freq: Every day | ORAL | Status: DC
  Administered 2016-01-15 – 2016-01-20 (×6): 40 mg via ORAL
  Filled 2016-01-14 (×6): qty 2

## 2016-01-14 MED ORDER — OXYCODONE-ACETAMINOPHEN 5-325 MG PO TABS
1.0000 | ORAL_TABLET | ORAL | Status: DC | PRN
  Administered 2016-01-15 – 2016-01-21 (×18): 1 via ORAL
  Filled 2016-01-14 (×17): qty 1

## 2016-01-14 MED ORDER — FENTANYL CITRATE (PF) 100 MCG/2ML IJ SOLN
INTRAMUSCULAR | Status: AC
  Administered 2016-01-14: 25 ug via INTRAVENOUS
  Filled 2016-01-14: qty 2

## 2016-01-14 MED ORDER — ACETAMINOPHEN 650 MG RE SUPP: 325.0000 mg | RECTAL | Status: DC | PRN

## 2016-01-14 MED ORDER — EPHEDRINE 5 MG/ML INJ
INTRAVENOUS | Status: AC
  Filled 2016-01-14: qty 10

## 2016-01-14 MED ORDER — ALUM & MAG HYDROXIDE-SIMETH 200-200-20 MG/5ML PO SUSP: 15.0000 mL | ORAL | Status: DC | PRN

## 2016-01-14 MED ORDER — TRAZODONE HCL 100 MG PO TABS
100.0000 mg | ORAL_TABLET | Freq: Every day | ORAL | Status: DC
  Administered 2016-01-15 – 2016-01-20 (×6): 100 mg via ORAL
  Filled 2016-01-14 (×4): qty 1
  Filled 2016-01-14: qty 2
  Filled 2016-01-14 (×2): qty 1

## 2016-01-14 MED ORDER — METOPROLOL TARTRATE 5 MG/5ML IV SOLN: 2.0000 mg | INTRAVENOUS | Status: DC | PRN

## 2016-01-14 MED ORDER — CEFUROXIME SODIUM 1.5 G IJ SOLR
1.5000 g | Freq: Two times a day (BID) | INTRAMUSCULAR | Status: DC
  Administered 2016-01-15: 1.5 g via INTRAVENOUS
  Filled 2016-01-14 (×2): qty 1.5

## 2016-01-14 MED ORDER — FLUTICASONE PROPIONATE 50 MCG/ACT NA SUSP
2.0000 | Freq: Every day | NASAL | Status: DC
  Filled 2016-01-14 (×2): qty 16

## 2016-01-14 MED ORDER — CLOPIDOGREL BISULFATE 75 MG PO TABS
75.0000 mg | ORAL_TABLET | Freq: Every day | ORAL | Status: DC
  Administered 2016-01-15 – 2016-01-21 (×7): 75 mg via ORAL
  Filled 2016-01-14 (×7): qty 1

## 2016-01-14 MED ORDER — SODIUM CHLORIDE 0.9% FLUSH
3.0000 mL | Freq: Two times a day (BID) | INTRAVENOUS | Status: DC
  Administered 2016-01-15 – 2016-01-21 (×13): 3 mL via INTRAVENOUS

## 2016-01-14 MED ORDER — 0.9 % SODIUM CHLORIDE (POUR BTL) OPTIME
TOPICAL | Status: DC | PRN
  Administered 2016-01-14: 1000 mL

## 2016-01-14 MED ORDER — EPHEDRINE SULFATE 50 MG/ML IJ SOLN
INTRAMUSCULAR | Status: DC | PRN
  Administered 2016-01-14 (×2): 10 mg via INTRAVENOUS
  Administered 2016-01-14: 5 mg via INTRAVENOUS
  Administered 2016-01-14 (×2): 15 mg via INTRAVENOUS

## 2016-01-14 MED ORDER — POTASSIUM CHLORIDE CRYS ER 20 MEQ PO TBCR: 20.0000 meq | EXTENDED_RELEASE_TABLET | Freq: Once | ORAL | Status: DC

## 2016-01-14 MED ORDER — MORPHINE SULFATE (PF) 2 MG/ML IV SOLN
2.0000 mg | INTRAVENOUS | Status: DC | PRN
  Administered 2016-01-15: 2 mg via INTRAVENOUS
  Administered 2016-01-17: 4 mg via INTRAVENOUS
  Filled 2016-01-14: qty 1

## 2016-01-14 MED ORDER — CALCIUM CHLORIDE 10 % IV SOLN
INTRAVENOUS | Status: AC
  Filled 2016-01-14: qty 10

## 2016-01-14 MED ORDER — FENTANYL CITRATE (PF) 100 MCG/2ML IJ SOLN: 25.0000 ug | INTRAMUSCULAR | Status: DC | PRN

## 2016-01-14 MED ORDER — MIDAZOLAM HCL 10 MG/2ML IJ SOLN
INTRAMUSCULAR | Status: AC
  Filled 2016-01-14: qty 2

## 2016-01-14 MED ORDER — SODIUM POLYSTYRENE SULFONATE 15 GM/60ML PO SUSP: 15.0000 g | ORAL | Status: DC

## 2016-01-14 MED ORDER — LABETALOL HCL 5 MG/ML IV SOLN
10.0000 mg | INTRAVENOUS | Status: DC | PRN
Start: 2016-01-14 — End: 2016-01-22

## 2016-01-14 MED ORDER — LEVOTHYROXINE SODIUM 25 MCG PO TABS
125.0000 ug | ORAL_TABLET | Freq: Every day | ORAL | Status: DC
  Administered 2016-01-15 – 2016-01-21 (×7): 125 ug via ORAL
  Filled 2016-01-14 (×8): qty 1

## 2016-01-14 MED ORDER — HEPARIN SODIUM (PORCINE) 1000 UNIT/ML IJ SOLN
INTRAMUSCULAR | Status: DC | PRN
  Administered 2016-01-14: 6 mL

## 2016-01-14 MED ORDER — CALCIUM ACETATE (PHOS BINDER) 667 MG PO CAPS: 1334.0000 mg | ORAL_CAPSULE | ORAL | Status: DC | PRN

## 2016-01-14 MED ORDER — ONDANSETRON HCL 4 MG/2ML IJ SOLN
INTRAMUSCULAR | Status: DC | PRN
  Administered 2016-01-14 (×2): 4 mg via INTRAVENOUS

## 2016-01-14 MED ORDER — KETOROLAC TROMETHAMINE 30 MG/ML IJ SOLN
INTRAMUSCULAR | Status: DC | PRN
  Administered 2016-01-14: 30 mg via INTRAVENOUS

## 2016-01-14 MED ORDER — PROTAMINE SULFATE 10 MG/ML IV SOLN
INTRAVENOUS | Status: DC | PRN
  Administered 2016-01-14: 20 mg via INTRAVENOUS

## 2016-01-14 MED ORDER — ACETAMINOPHEN 325 MG PO TABS
325.0000 mg | ORAL_TABLET | ORAL | Status: DC | PRN
  Filled 2016-01-14: qty 2

## 2016-01-14 MED ORDER — PROPOFOL 10 MG/ML IV BOLUS
INTRAVENOUS | Status: AC
  Filled 2016-01-14: qty 20

## 2016-01-14 MED ORDER — HEPARIN SODIUM (PORCINE) 5000 UNIT/ML IJ SOLN
5000.0000 [IU] | Freq: Three times a day (TID) | INTRAMUSCULAR | Status: DC
  Administered 2016-01-15 – 2016-01-21 (×18): 5000 [IU] via SUBCUTANEOUS
  Filled 2016-01-14 (×18): qty 1

## 2016-01-14 MED ORDER — PANTOPRAZOLE SODIUM 40 MG PO TBEC
40.0000 mg | DELAYED_RELEASE_TABLET | Freq: Every day | ORAL | Status: DC
  Administered 2016-01-15 – 2016-01-21 (×7): 40 mg via ORAL
  Filled 2016-01-14 (×7): qty 1

## 2016-01-14 MED ORDER — CALCIUM ACETATE (PHOS BINDER) 667 MG PO CAPS
2668.0000 mg | ORAL_CAPSULE | Freq: Three times a day (TID) | ORAL | Status: DC
  Administered 2016-01-15 – 2016-01-21 (×17): 2668 mg via ORAL
  Filled 2016-01-14 (×18): qty 4

## 2016-01-14 MED ORDER — DOCUSATE SODIUM 100 MG PO CAPS
100.0000 mg | ORAL_CAPSULE | Freq: Every day | ORAL | Status: DC
  Administered 2016-01-16 – 2016-01-21 (×6): 100 mg via ORAL
  Filled 2016-01-14 (×7): qty 1

## 2016-01-14 MED ORDER — PROPOFOL 10 MG/ML IV BOLUS
INTRAVENOUS | Status: DC | PRN
  Administered 2016-01-14: 300 mg via INTRAVENOUS
  Administered 2016-01-14: 50 mg via INTRAVENOUS

## 2016-01-14 MED ORDER — SODIUM CHLORIDE 0.9 % IV SOLN
INTRAVENOUS | Status: DC
  Administered 2016-01-14 (×3): via INTRAVENOUS

## 2016-01-14 MED ORDER — OXYCODONE HCL 5 MG PO TABS
5.0000 mg | ORAL_TABLET | ORAL | Status: DC | PRN
  Administered 2016-01-15 – 2016-01-21 (×18): 5 mg via ORAL
  Filled 2016-01-14 (×17): qty 1

## 2016-01-14 MED ORDER — CLOPIDOGREL BISULFATE 75 MG PO TABS
300.0000 mg | ORAL_TABLET | Freq: Once | ORAL | Status: AC
  Administered 2016-01-15: 300 mg via ORAL
  Filled 2016-01-14: qty 4

## 2016-01-14 MED ORDER — FENTANYL CITRATE (PF) 100 MCG/2ML IJ SOLN
25.0000 ug | INTRAMUSCULAR | Status: DC | PRN
  Administered 2016-01-14: 25 ug via INTRAVENOUS

## 2016-01-14 MED ORDER — ONDANSETRON HCL 4 MG/2ML IJ SOLN
4.0000 mg | Freq: Once | INTRAMUSCULAR | Status: AC | PRN
  Administered 2016-01-14: 4 mg via INTRAVENOUS

## 2016-01-14 MED ORDER — HEPARIN SODIUM (PORCINE) 1000 UNIT/ML IJ SOLN
INTRAMUSCULAR | Status: AC
  Filled 2016-01-14: qty 1

## 2016-01-14 MED ORDER — MIDAZOLAM HCL 5 MG/5ML IJ SOLN
INTRAMUSCULAR | Status: DC | PRN
  Administered 2016-01-14: 50 mg via INTRAVENOUS
  Administered 2016-01-14: 2 mg via INTRAVENOUS

## 2016-01-14 MED ORDER — HYDRALAZINE HCL 20 MG/ML IJ SOLN: 5.0000 mg | INTRAMUSCULAR | Status: DC | PRN

## 2016-01-14 MED ORDER — CALCIUM ACETATE 667 MG PO CAPS: 1334.0000 mg | ORAL_CAPSULE | Freq: Three times a day (TID) | ORAL | Status: DC

## 2016-01-14 MED ORDER — PROTAMINE SULFATE 10 MG/ML IV SOLN
INTRAVENOUS | Status: AC
  Filled 2016-01-14: qty 5

## 2016-01-14 MED ORDER — KETOROLAC TROMETHAMINE 30 MG/ML IJ SOLN
INTRAMUSCULAR | Status: AC
  Filled 2016-01-14: qty 1

## 2016-01-14 MED ORDER — GUAIFENESIN-DM 100-10 MG/5ML PO SYRP
15.0000 mL | ORAL_SOLUTION | ORAL | Status: DC | PRN
  Administered 2016-01-19 – 2016-01-21 (×6): 15 mL via ORAL
  Filled 2016-01-14 (×6): qty 15

## 2016-01-14 MED ORDER — SODIUM CHLORIDE 0.9% FLUSH: 3.0000 mL | INTRAVENOUS | Status: DC | PRN

## 2016-01-14 MED ORDER — SODIUM CHLORIDE 0.9 % IV SOLN
INTRAVENOUS | Status: DC | PRN
  Administered 2016-01-14: 500 mL

## 2016-01-14 SURGICAL SUPPLY — 99 items
BAG BANDED W/RUBBER/TAPE 36X54 (MISCELLANEOUS) ×5 IMPLANT
BALLN ATLAS 14X40X75 (BALLOONS) ×5
BALLN MUSTANG 8X80X75 (BALLOONS) ×5
BALLOON ATLAS 14X40X75 (BALLOONS) ×4 IMPLANT
BALLOON MUSTANG 8X80X75 (BALLOONS) ×4 IMPLANT
BANDAGE ACE 4X5 VEL STRL LF (GAUZE/BANDAGES/DRESSINGS) IMPLANT
BANDAGE ESMARK 6X9 LF (GAUZE/BANDAGES/DRESSINGS) IMPLANT
BER 11 IMPLANT
BIOPATCH WHT 1IN DISK W/4.0 H (GAUZE/BANDAGES/DRESSINGS) ×5 IMPLANT
BNDG ESMARK 6X9 LF (GAUZE/BANDAGES/DRESSINGS)
CANISTER SUCTION 2500CC (MISCELLANEOUS) ×5 IMPLANT
CANNULA VESSEL 3MM 2 BLNT TIP (CANNULA) IMPLANT
CATH ANGIO 5F BER2 100CM (CATHETERS) ×5 IMPLANT
CATH ANGIO 5F BER2 65CM (CATHETERS) ×5 IMPLANT
CATH EMB 4FR 40CM (CATHETERS) ×5 IMPLANT
CATH PALINDROME REV 23CM (CATHETERS) IMPLANT
CATH PALINDROME REV 28CM (CATHETERS) IMPLANT
CATH PALINDROME REV 55CM (CATHETERS) ×5 IMPLANT
CATH VISIONS PV .035 IVUS (CATHETERS) ×5 IMPLANT
CLIP TI MEDIUM 24 (CLIP) ×5 IMPLANT
CLIP TI WIDE RED SMALL 24 (CLIP) ×5 IMPLANT
COVER DOME SNAP 22 D (MISCELLANEOUS) ×5 IMPLANT
COVER PROBE W GEL 5X96 (DRAPES) ×5 IMPLANT
CUFF TOURNIQUET SINGLE 24IN (TOURNIQUET CUFF) IMPLANT
CUFF TOURNIQUET SINGLE 34IN LL (TOURNIQUET CUFF) IMPLANT
CUFF TOURNIQUET SINGLE 44IN (TOURNIQUET CUFF) IMPLANT
DERMABOND ADVANCED (GAUZE/BANDAGES/DRESSINGS) ×2
DERMABOND ADVANCED .7 DNX12 (GAUZE/BANDAGES/DRESSINGS) ×8 IMPLANT
DEVICE TORQUE H2O (MISCELLANEOUS) ×5 IMPLANT
DRAIN CHANNEL 15F RND FF W/TCR (WOUND CARE) IMPLANT
DRAPE CHEST BREAST 15X10 FENES (DRAPES) ×5 IMPLANT
DRAPE PROXIMA HALF (DRAPES) IMPLANT
DRAPE X-RAY CASS 24X20 (DRAPES) IMPLANT
ELECT REM PT RETURN 9FT ADLT (ELECTROSURGICAL) ×5
ELECTRODE REM PT RTRN 9FT ADLT (ELECTROSURGICAL) ×4 IMPLANT
EVACUATOR SILICONE 100CC (DRAIN) IMPLANT
GAUZE SPONGE 2X2 8PLY STRL LF (GAUZE/BANDAGES/DRESSINGS) ×4 IMPLANT
GAUZE SPONGE 4X4 16PLY XRAY LF (GAUZE/BANDAGES/DRESSINGS) ×5 IMPLANT
GLOVE BIO SURGEON STRL SZ 6.5 (GLOVE) ×25 IMPLANT
GLOVE BIO SURGEON STRL SZ7.5 (GLOVE) ×10 IMPLANT
GLOVE BIOGEL PI IND STRL 6.5 (GLOVE) ×12 IMPLANT
GLOVE BIOGEL PI INDICATOR 6.5 (GLOVE) ×3
GOWN STRL REUS W/ TWL LRG LVL3 (GOWN DISPOSABLE) ×20 IMPLANT
GOWN STRL REUS W/ TWL XL LVL3 (GOWN DISPOSABLE) ×4 IMPLANT
GOWN STRL REUS W/TWL LRG LVL3 (GOWN DISPOSABLE) ×5
GOWN STRL REUS W/TWL XL LVL3 (GOWN DISPOSABLE) ×1
GRAFT GORETEX 6X40 (Vascular Products) ×5 IMPLANT
GUIDEWIRE AMPLATZ SS .035X260 (WIRE) ×5 IMPLANT
GUIDEWIRE ANGLED .035X150CM (WIRE) ×10 IMPLANT
GUIDEWIRE ANGLED .035X260CM (WIRE) ×5 IMPLANT
HEMOSTAT SPONGE AVITENE ULTRA (HEMOSTASIS) ×5 IMPLANT
INSERT FOGARTY SM (MISCELLANEOUS) ×10 IMPLANT
KIT BASIN OR (CUSTOM PROCEDURE TRAY) ×5 IMPLANT
KIT ENCORE 26 ADVANTAGE (KITS) ×5 IMPLANT
KIT ROOM TURNOVER OR (KITS) ×5 IMPLANT
MARKER GRAFT CORONARY BYPASS (MISCELLANEOUS) IMPLANT
MUSTANG OVER THE WIRE PTA BALLOON DILATATION CATHETER IMPLANT
NEEDLE PERC 18GX7CM (NEEDLE) ×5 IMPLANT
NS IRRIG 1000ML POUR BTL (IV SOLUTION) ×10 IMPLANT
PACK PERIPHERAL VASCULAR (CUSTOM PROCEDURE TRAY) ×5 IMPLANT
PAD ARMBOARD 7.5X6 YLW CONV (MISCELLANEOUS) ×10 IMPLANT
PROTECTION STATION PRESSURIZED (MISCELLANEOUS) ×5
SET COLLECT BLD 21X3/4 12 (NEEDLE) IMPLANT
SET COLLECT BLD 21X3/4 12 PB (MISCELLANEOUS) ×5 IMPLANT
SHEATH AVANTI 11CM 8FR (MISCELLANEOUS) ×5 IMPLANT
SHEATH PINNACLE R/O II 7F 4CM (SHEATH) ×5 IMPLANT
SPONGE GAUZE 2X2 STER 10/PKG (GAUZE/BANDAGES/DRESSINGS) ×1
SPONGE GAUZE 4X4 12PLY STER LF (GAUZE/BANDAGES/DRESSINGS) ×5 IMPLANT
SPONGE LAP 18X18 X RAY DECT (DISPOSABLE) ×25 IMPLANT
STATION PROTECTION PRESSURIZED (MISCELLANEOUS) ×4 IMPLANT
STENT VIABAHN 8X50X120 (Permanent Stent) ×1 IMPLANT
STENT VIABAHN5X120X8FR 8X (Permanent Stent) ×4 IMPLANT
STENT WALLSTENTÂ  12X60X75 (Permanent Stent) ×5 IMPLANT
STOPCOCK 4 WAY LG BORE MALE ST (IV SETS) ×5 IMPLANT
SUT ETHILON 3 0 PS 1 (SUTURE) ×10 IMPLANT
SUT GORETEX 5 0 TT13 24 (SUTURE) ×10 IMPLANT
SUT GORETEX 6.0 TT13 (SUTURE) IMPLANT
SUT GORETEX 6.0 TT9 (SUTURE) IMPLANT
SUT MNCRL AB 4-0 PS2 18 (SUTURE) ×20 IMPLANT
SUT PROLENE 5 0 C 1 24 (SUTURE) ×15 IMPLANT
SUT PROLENE 6 0 BV (SUTURE) ×10 IMPLANT
SUT PROLENE 7 0 BV 1 (SUTURE) IMPLANT
SUT SILK 2 0 SH (SUTURE) ×5 IMPLANT
SUT SILK 3 0 (SUTURE)
SUT SILK 3-0 18XBRD TIE 12 (SUTURE) IMPLANT
SUT VIC AB 2-0 CT1 27 (SUTURE) ×2
SUT VIC AB 2-0 CT1 TAPERPNT 27 (SUTURE) ×8 IMPLANT
SUT VIC AB 3-0 SH 27 (SUTURE) ×3
SUT VIC AB 3-0 SH 27X BRD (SUTURE) ×12 IMPLANT
SUT VIC AB 4-0 PS2 27 (SUTURE) ×5 IMPLANT
SYR 5ML LL (SYRINGE) ×5 IMPLANT
TAPE CLOTH SURG 4X10 WHT LF (GAUZE/BANDAGES/DRESSINGS) ×5 IMPLANT
TAPE UMBILICAL COTTON 1/8X30 (MISCELLANEOUS) IMPLANT
TOWEL OR 17X24 6PK STRL BLUE (TOWEL DISPOSABLE) ×5 IMPLANT
TRAY FOLEY W/METER SILVER 16FR (SET/KITS/TRAYS/PACK) ×5 IMPLANT
TUBING EXTENTION W/L.L. (IV SETS) ×5 IMPLANT
UNDERPAD 30X30 (UNDERPADS AND DIAPERS) ×5 IMPLANT
WATER STERILE IRR 1000ML POUR (IV SOLUTION) ×5 IMPLANT
WIRE BENTSON .035X145CM (WIRE) ×5 IMPLANT

## 2016-01-14 NOTE — Anesthesia Procedure Notes (Signed)
Procedure Name: LMA Insertion Date/Time: 01/14/2016 3:44 PM Performed by: Izora Gala Pre-anesthesia Checklist: Patient identified, Emergency Drugs available, Suction available and Patient being monitored Patient Re-evaluated:Patient Re-evaluated prior to inductionOxygen Delivery Method: Circle system utilized Preoxygenation: Pre-oxygenation with 100% oxygen Intubation Type: IV induction Ventilation: Mask ventilation without difficulty LMA: LMA with gastric port inserted LMA Size: 5.0 Number of attempts: 1 Placement Confirmation: positive ETCO2 and breath sounds checked- equal and bilateral Tube secured with: Tape Dental Injury: Teeth and Oropharynx as per pre-operative assessment

## 2016-01-14 NOTE — Op Note (Signed)
Patient name: [name] MRN: [id number] DOB: 06-Sep-1965 Sex: male  01/14/2016 Pre-operative Diagnosis: 1. esrd 2. 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 Donzetta Matters, MD Assistant: Leontine Locket, PA  Silva Bandy, [place] Procedure Performed: 1.  Thromboembolectomy of left thigh graft 2.  Revision of left thigh av graft with 38mm propatent graft 3.  shuntogram with central venogram 4.  ivus of ivc, left common iliac vein, left external iliac vein 5.  Stent of left common and external iliac vein with 14x90 wallstent 6.  Stent of venous anastomosis with 8 x 50 viabahn 7.  US guided cannulation of right common femoral vein 8.  Balloon angioplasty of right common and external iliac veins with 26mm balloon 9.  Placement of 55cm tunneled dialysis catheter.   Indications:  51 year old white male history of end-stage renal disease and multiple previous upper extremity accesses as well as a left femoral graft that has had multiple interventions. He now presents with clot in his left femoral graft and is indicated for thrombectomy with possible central intervention and possible catheterization.  Findings: The graft had acute clot and an also had an area with significant pseudoaneurysm that required replacement with new 6 mm graft. The reasoning for graft thrombosis was both venous anastomotic stenosis as well as iliac vein total occlusion on the left. This required stenting with wall stent in the iliac vein and viabahn stent at the anastomosis. At the completion there was brisk runoff centrally and the graft was patent.  On the patient's right side a tunnel dialysis catheter was placed however there was also a stenosis which required balloon angioplasty and ultimately a 55 cm catheter was placed to the level of the superior aspect of the IVC.    Procedure:  The  patient was identified in the holding area and taken to the operating room where he was placed supine on the operating table sterilely prepped and draped in the left groin in the usual fashion given antibiotics and timeout called. Following this we made a transverse incision overlying the central port of the graft dissected down onto the graft. Unfortunately at this level there was a large pseudoaneurysm and no anterior wall of the graft. We did not identify acute clot. We then moved towards the venous anastomosis medially again made a transverse incision over the graft dissected out the graft placed vessel loop around it and then similarly did an incision over the arterial aspect on the lateral thigh dissecting out the graft. The patient this time was heparinized and did maintain heparinization throughout this case. We then made transverse incisions and ultimately divided our graft at arm medial and lateral incisions. We then performed graft thrombectomy with 4 [place] until we returned all clots and had both venous and arterial bleeding. The graft was flushed with heparinized saline and clamped. We then tunneled a 6 mm propatent graft using counter incision. It was sewn end and on both sides using CV 5 Gore-Tex suture. Prior to releasing our clamps we did perform blush flushing maneuvers of both the arterial and venous anastomotic sites. We did have significant pulsatility in the graft. 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 We then clamped the graft and performed a retrograde shot which demonstrated patency  of the arterial anastomosis. We upsized to an 8 [place] sheath and then using the catheter and Glidewire were able to cross the central venous stenosis. We placed an Amplatz wire up into the SVC however this also was noted to be occluded on venogram. We then  performed IVIS of the IVC and iliac veins on the patient's left with the above findings. Balloon angioplasty of the occlusions was performed and an 14 x 90 stent was brought into place and deployed. This was then postdilated with 14 mm balloon and completion venography demonstrated brisk runoff without filling of collaterals that were previously there. IVIS did demonstrate a stenosis of the venous anastomosis and so we then delivered an 8 mm viabahn stent deployed across the anastomosis and postdilated with 38mm balloon. Completion angiogram again demonstrated brisk flow through our graft and centrally. Satisfied patient was given protamine which he tolerated well hemostasis obtained and his wounds and they were closed over the graft with 3-0 Vicryl interrupted and 4-0 Monocryl. The area where we excised a piece of graft and tunneled around was closed with interrupted 3-0 Vicryl followed by 3-0 nylon. All incisions were covered with Dermabond. We then reprepped and draped now the right groin. Ultrasound was used to identify the common femoral vein on the right and was easily cannulated with 18-gauge needle. We then passed a wire which had trouble passing. Because of this we placed an 8 French sheath through the right common femoral vein. We then used a pair catheter and Glidewire to traverse centrally. We then performed balloon angioplasty of the common and external iliac vein on the right with 65mm balloon. A counterincision was made and at first a 19 cm catheter was tunneled. We exchanged the 8 French sheath for the introducer and the 19cm catheter was attempted to be placed. This however would not work so we then re-tunneled a 28 cm catheter replaced the 8 French sheath and attempted to tunnel over an Amplatz wire but could not do that. This catheter to was aborted. We then through the 8 French sheath were able to get an Amplatz and a stiff Glidewire centrally. The [place] sheath was then removed and a 55 cm  catheter introduced over a double wire system. We were able to get this just to the terminal aspect of the IVC right below the right atrium. The wires were then removed and the catheter tunneled through our counter incision. It was trimmed to size and assembled. It did flush easily and was ultimately locked with 3 mL of concentrated heparin. The groin incision was closed with 4-0 Monocryl and the catheter was affixed with 3-0 nylon suture. Sterile dressing was placed. Satisfied patient was awakened from anesthesia having tolerated the procedure well transferred PACU in stable condition.  Blood loss 750 mL.  Contrast 155 mL.   Brandon C. Donzetta Matters, MD [facility] [place] Office: [phone] Pager: [phone]

## 2016-01-14 NOTE — ED Notes (Signed)
Vascular Surgery at the bedside

## 2016-01-14 NOTE — Progress Notes (Signed)
Called Dr [name] regarding BP systolic still in the 43'P/29'J. Dr [name] states he is fine with that BP and patient can transfer to stepdown.

## 2016-01-14 NOTE — Transfer of Care (Signed)
Immediate Anesthesia Transfer of Care Note  Patient: [name]  Procedure(s) Performed: Procedure(s): THROMBECTOMY ARTERIOVENOUS GORE-TEX Left thigh GRAFT (Left) INTRA OPERATIVE ARTERIOGRAM (Left) REVISION OF ARTERIOVENOUS GORETEX GRAFT (Left) INSERTION OF DIALYSIS CATHETER A/V SHUNTOGRAM INSERTION OF ILIAC STENT (Left)  Patient Location: PACU  Anesthesia Type:General  Level of Consciousness: awake, alert  and oriented  Airway & Oxygen Therapy: Patient connected to nasal cannula oxygen  Post-op Assessment: Report given to RN, Post -op Vital signs reviewed and stable and Patient moving all extremities X 4  Post vital signs: Reviewed and stable  Last Vitals:  Vitals:   01/14/16 1230 01/14/16 2118  BP: 102/57 (!) 102/25  Pulse: 81   Resp:    Temp:  36.1 C    Last Pain:  Vitals:   01/14/16 1124  TempSrc: Oral         Complications: No apparent anesthesia complications

## 2016-01-14 NOTE — ED Triage Notes (Signed)
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 Pt has extensive hx of this happening. Pt went to dialysis today and was told he needed to come here to have it fixed. Pt did not receive dialysis.

## 2016-01-14 NOTE — ED Provider Notes (Signed)
[facility] DEPT Provider Note   CSN: [id number] Arrival date & time: 01/14/16  1121     History   Chief Complaint Chief Complaint  Patient presents with  . Vascular Access Problem    HPI [name] is a 51 y.o. male.  HPI Patient went to dialysis today and his left anterior thigh AV graft was found to be dysfunctional. They identified no palpable thrill or auscultatable bruit. Patient was referred to the emergency department for HD access management. The patient reports he has no pain and had no redness. He reports that works fine on Wednesday and he underwent a normal dialysis session. He has not had fever chills or otherwise felt unwell. He reports that same graft is had to have prior revisions as well. Past Medical History:  Diagnosis Date  . Anemia   . Anxiety   . End stage renal failure on dialysis ([place])   . GERD (gastroesophageal reflux disease)   . Headache(784.0)    migraines  . Hypertension    hx of - has not taken bp meds in over 2 years  . Hypothyroidism   . Insomnia   . Kidney stones   . Seizures ([place])    last 1998  . Shortness of breath    rarely  . Spina bifida [facility])     Patient Active Problem List   Diagnosis Date Noted  . Removal of staples 07/19/2012  . Other complications due to renal dialysis device, implant, and graft 06/27/2012  . End stage renal disease ([place]) 06/27/2012    Past Surgical History:  Procedure Laterality Date  . APPENDECTOMY    . ARTERIOVENOUS GRAFT PLACEMENT Left 10/16/2012   left femoral goretex graft         Dr Donnetta Hutching  . AV FISTULA PLACEMENT Left 06/27/2012   Procedure: EXPLORATORY LEFT THY-GRAFT PSEUDO-ANEURYSM;  Surgeon: Conrad Mount Savage, MD;  Location: [place];  Service: Vascular;  Laterality: Left;  Revision of left Arteriovenus gortex graft in thigh.  . COLONOSCOPY    . INCISION AND DRAINAGE Left 10/16/2012   Procedure: INCISION AND Debridement left thigh graft;  Surgeon: Rosetta Posner, MD;  Location: [place];  Service:  Vascular;  Laterality: Left;  . INSERTION OF DIALYSIS CATHETER    . KNEE SURGERY Right    patella and tendon  . LITHOTRIPSY     x 3  . REVISION OF ARTERIOVENOUS GORETEX GRAFT Left 10/16/2012   Procedure: REVISION OF LEFT FEMORAL LOOP ARTERIOVENOUS GORETEX GRAFT;  Surgeon: Rosetta Posner, MD;  Location: [place];  Service: Vascular;  Laterality: Left;  . REVISION OF ARTERIOVENOUS GORETEX GRAFT Left 02/11/2015   Procedure: EXCISION OF SMALL SEGMENT OF EXPOSED LEFT THIGH NON FUNCTIONING  ARTERIOVENOUS GORETEX GRAFT;  Surgeon: Mal Misty, MD;  Location: [place];  Service: Vascular;  Laterality: Left;       Home Medications    Prior to Admission medications   Medication Sig Start Date End Date Taking? Authorizing Provider  acetaminophen (TYLENOL) 325 MG tablet Take 650 mg by mouth 3 (three) times a week. Takes at dialysis treatments (m/W/F)    Historical Provider, MD  aspirin 325 MG tablet Take 325 mg by mouth daily.    Historical Provider, MD  azelastine (ASTELIN) 137 MCG/SPRAY nasal spray Place 1 spray into the nose 2 (two) times daily. Use in each nostril as directed    Historical Provider, MD  calcium acetate (PHOSLO) 667 MG capsule Take 1,334-2,668 mg by mouth 3 (three) times daily  with meals. 4 capsules with meals, 3 with snacks    Historical Provider, MD  Cinacalcet HCl (SENSIPAR PO) Take by mouth. Reported on 03/09/2015    Historical Provider, MD  DiphenhydrAMINE HCl (BENADRYL ALLERGY PO) Take by mouth 3 (three) times a week. Three times weekly at dialysis (M/W/F)    Historical Provider, MD  famotidine (PEPCID) 40 MG tablet Take 40 mg by mouth daily.    Historical Provider, MD  fluticasone (FLONASE) 50 MCG/ACT nasal spray Place 2 sprays into the nose daily.    Historical Provider, MD  furosemide (LASIX) 80 MG tablet Take 80 mg by mouth 2 (two) times daily.    Historical Provider, MD  levothyroxine (SYNTHROID, LEVOTHROID) 125 MCG tablet Take 125 mcg by mouth daily before breakfast.     Historical Provider, MD  LORazepam (ATIVAN) 0.5 MG tablet Take 0.5 mg by mouth daily.    Historical Provider, MD  oxyCODONE-acetaminophen (PERCOCET) 10-325 MG tablet Take 1 tablet by mouth every 4 (four) hours as needed for pain. 02/11/15   Samantha J Rhyne, PA-C  promethazine (PHENERGAN) 25 MG tablet Take 25 mg by mouth every 6 (six) hours as needed for nausea.    Historical Provider, MD  sodium polystyrene (KAYEXALATE) powder Take 15 g by mouth 2 (two) times a week. Saturday and Sunday    Historical Provider, MD  traZODone (DESYREL) 100 MG tablet Take 100 mg by mouth at bedtime.    Historical Provider, MD  zolpidem (AMBIEN) 10 MG tablet Take 10 mg by mouth at bedtime as needed for sleep.    Historical Provider, MD    Family History Family History  Problem Relation Age of Onset  . Diabetes Mother   . Hypertension Mother   . Heart disease Mother     before age 79  . Diabetes Father   . Heart attack Father     X's 3  . Diabetes Sister   . Bipolar disorder Sister     Social History Social History  Substance Use Topics  . Smoking status: Never Smoker  . Smokeless tobacco: Never Used  . Alcohol use No     Allergies   Furadantin [nitrofurantoin]; Mandelamine [methenamine]; Noroxin [norfloxacin]; Carmine; Contrast media [iodinated diagnostic agents]; Hydrocodone; Metrizamide; Sulfa antibiotics; and Sulfur   Review of Systems Review of Systems 10 Systems reviewed and are negative for acute change except as noted in the HPI.   Physical Exam Updated Vital Signs BP 102/57   Pulse 81   Temp 99.4 F (37.4 C) (Oral)   Resp 16   Ht 5\' 11"  (1.803 m)   Wt 270 lb (122.5 kg)   SpO2 96%   BMI 37.66 kg/m   Physical Exam  Constitutional: He is oriented to person, place, and time.  Patient is alert and nontoxic. Well in appearance. Central obesity. No respiratory distress.  Eyes: EOM are normal.  Cardiovascular: Normal rate and regular rhythm.  Exam reveals no gallop.   Murmur  heard. 2\6 systolic ejection murmur.  Pulmonary/Chest: Effort normal and breath sounds normal.  Abdominal: Soft. He exhibits no distension. There is no tenderness. There is no guarding.  Colostomy bag right lower abdomen.  Musculoskeletal: Normal range of motion.  Patient's left anterior thigh has a palpable graft. This makes horseshoe shape and is firm without any palpable thrill throughout. Several small dry eschar's from prior access sites. No surrounding erythema no tenderness no drainage discharge or bleeding  Neurological: He is alert and oriented to person, place, and time.  He exhibits normal muscle tone. Coordination normal.  Skin: Skin is warm and dry.  Psychiatric: He has a normal mood and affect.     ED Treatments / Results  Labs (all labs ordered are listed, but only abnormal results are displayed) Labs Reviewed  BASIC METABOLIC PANEL  CBC WITH DIFFERENTIAL/PLATELET  PROTIME-INR    EKG  EKG Interpretation None       Radiology No results found.  Procedures Procedures (including critical care time)  Medications Ordered in ED Medications - No data to display   Initial Impression / Assessment and Plan / ED Course  I have reviewed the triage vital signs and the nursing notes.  Pertinent labs & imaging results that were available during my care of the patient were reviewed by me and considered in my medical decision making (see chart for details).  Clinical Course    Consult: Reviewed with Dr. Donzetta Matters of vascular surgery. They will consult in the emergency department for definitive management.  Final Clinical Impressions(s) / ED Diagnoses   Final diagnoses:  Complication of vascular access for dialysis, initial encounter  ESRD (end stage renal disease) ([place])   Patient is alert and nontoxic. He is clinically well. He denies any immediate problems with his baseline health. He has multiple medical comorbidities. The patient presents with an HD AV graft that is  occluded. Vascular surgery will consult for definitive management. New Prescriptions New Prescriptions   No medications on file     Charlesetta Shanks, MD 01/14/16 1404

## 2016-01-14 NOTE — Consult Note (Signed)
Vascular and Vein Specialist of [facility]  Patient name: [name] MRN: [id number] DOB: 08-09-1965 Sex: male  REASON FOR CONSULT: clotted thigh graft, consult is from EDP  HPI: [name] is a 51 y.o. male with ESRD on HD (MWF), who presents from his dialysis center with a clotted left thigh graft. His last dialysis center was Wednesday. He is known to [place] from having undergone prior revisions in his left thigh graft. He has a history of failed accesses in his bilateral upper extremities. He has never had access in the right leg. His primary nephrologist is Dr. Mercy Moore.   He last ate last night. He takes a daily aspirin. He is not on bloodthinners. His past medical history includes anemia, spina bifida and hypertension, all of which are stable.   Past Medical History:  Diagnosis Date  . Anemia   . Anxiety   . End stage renal failure on dialysis ([place])   . GERD (gastroesophageal reflux disease)   . Headache(784.0)    migraines  . Hypertension    hx of - has not taken bp meds in over 2 years  . Hypothyroidism   . Insomnia   . Kidney stones   . Seizures ([place])    last 1998  . Shortness of breath    rarely  . Spina bifida ([place])     Family History  Problem Relation Age of Onset  . Diabetes Mother   . Hypertension Mother   . Heart disease Mother     before age 25  . Diabetes Father   . Heart attack Father     X's 3  . Diabetes Sister   . Bipolar disorder Sister     SOCIAL HISTORY: Social History   Social History  . Marital status: Single    Spouse name: N/A  . Number of children: N/A  . Years of education: N/A   Occupational History  . Not on file.   Social History Main Topics  . Smoking status: Never Smoker  . Smokeless tobacco: Never Used  . Alcohol use No  . Drug use: No     Comment: in the past  . Sexual activity: Not on file   Other Topics Concern  . Not on file   Social History Narrative  . No narrative on file    Allergies    Allergen Reactions  . Furadantin [Nitrofurantoin] Other (See Comments)    unknown  . Mandelamine [Methenamine] Other (See Comments)    unknown  . Noroxin [Norfloxacin] Other (See Comments)    unknown  . Carmine Nausea Only  . Contrast Media [Iodinated Diagnostic Agents] Nausea And Vomiting    Oral dye causes vomiting, IV dye is okay  . Hydrocodone Other (See Comments)    Pt states that after 3 weeks of taking this medication he will began to "twitch"  . Metrizamide Nausea And Vomiting    Oral dye causes vomiting, IV dye is okay  . Sulfa Antibiotics Cough  . Sulfur Cough    No current facility-administered medications for this encounter.    Current Outpatient Prescriptions  Medication Sig Dispense Refill  . acetaminophen (TYLENOL) 325 MG tablet Take 650 mg by mouth 3 (three) times a week. Takes at dialysis treatments (m/W/F)    . aspirin 325 MG tablet Take 325 mg by mouth daily.    Marland Kitchen azelastine (ASTELIN) 137 MCG/SPRAY nasal spray Place 1 spray into the nose 2 (two) times daily. Use in each nostril as directed    .  calcium acetate (PHOSLO) 667 MG capsule Take 1,334-2,668 mg by mouth 3 (three) times daily with meals. 4 capsules with meals, 3 with snacks    . Cinacalcet HCl (SENSIPAR PO) Take by mouth. Reported on 03/09/2015    . DiphenhydrAMINE HCl (BENADRYL ALLERGY PO) Take by mouth 3 (three) times a week. Three times weekly at dialysis (M/W/F)    . famotidine (PEPCID) 40 MG tablet Take 40 mg by mouth daily.    . fluticasone (FLONASE) 50 MCG/ACT nasal spray Place 2 sprays into the nose daily.    . furosemide (LASIX) 80 MG tablet Take 80 mg by mouth 2 (two) times daily.    Marland Kitchen levothyroxine (SYNTHROID, LEVOTHROID) 125 MCG tablet Take 125 mcg by mouth daily before breakfast.    . LORazepam (ATIVAN) 0.5 MG tablet Take 0.5 mg by mouth daily.    Marland Kitchen oxyCODONE-acetaminophen (PERCOCET) 10-325 MG tablet Take 1 tablet by mouth every 4 (four) hours as needed for pain. 30 tablet 0  . promethazine  (PHENERGAN) 25 MG tablet Take 25 mg by mouth every 6 (six) hours as needed for nausea.    . sodium polystyrene (KAYEXALATE) powder Take 15 g by mouth 2 (two) times a week. Saturday and Sunday    . traZODone (DESYREL) 100 MG tablet Take 100 mg by mouth at bedtime.    Marland Kitchen zolpidem (AMBIEN) 10 MG tablet Take 10 mg by mouth at bedtime as needed for sleep.      REVIEW OF SYSTEMS:  [X]  denotes positive finding, [ ]  denotes negative finding Cardiac  Comments:  Chest pain or chest pressure:    Shortness of breath upon exertion:    Short of breath when lying flat:    Irregular heart rhythm:        Vascular    Pain in calf, thigh, or hip brought on by ambulation:    Pain in feet at night that wakes you up from your sleep:     Blood clot in your veins:    Leg swelling:         Pulmonary    Oxygen at home:    Productive cough:     Wheezing:         Neurologic    Sudden weakness in arms or legs:     Sudden numbness in arms or legs:     Sudden onset of difficulty speaking or slurred speech:    Temporary loss of vision in one eye:     Problems with dizziness:         Gastrointestinal    Blood in stool:     Vomited blood:         Genitourinary    Burning when urinating:     Blood in urine:        Psychiatric    Major depression:         Hematologic    Bleeding problems:    Problems with blood clotting too easily:        Skin    Rashes or ulcers:        Constitutional    Fever or chills:      PHYSICAL EXAM: Vitals:   01/14/16 1124 01/14/16 1131 01/14/16 1215 01/14/16 1230  BP:  106/65 117/66 102/57  Pulse: 84  78 81  Resp: 16     Temp: 99.4 F (37.4 C)     TempSrc: Oral     SpO2: 96%  93% 96%  Weight:  Height:        GENERAL: The patient is a well-nourished male, in no acute distress. The vital signs are documented above. CARDIAC: There is a regular rate and rhythm. No carotid bruits.  VASCULAR: clotted left thigh AV thigh graft PULMONARY: There is good air  exchange bilaterally without wheezing or rales. MUSCULOSKELETAL: There are no major deformities or cyanosis. NEUROLOGIC: No focal deficits.  SKIN: There are no ulcers or rashes noted. PSYCHIATRIC: The patient has a normal affect.   MEDICAL ISSUES: Clotted left AV thigh graft  Plan for thrombectomy and revision of left thigh AV graft and possible catheter placement today with Dr. Donzetta Matters. He has been NPO since last night.  Will admit overnight. Will contact renal regarding dialysis.   Virgina Jock, PA-C [facility] Vein Specialists of [place] [phone]    I have independently interviewed patient and agree with PA assessment and plan above. Open thrombectomy with possible tdc and will need dialysis.   Reneshia Zuccaro C. Donzetta Matters, MD [facility] Specialists of [place] Office: [phone] Pager: [phone]

## 2016-01-14 NOTE — Anesthesia Preprocedure Evaluation (Addendum)
Anesthesia Evaluation  Patient identified by MRN, date of birth, ID band Patient awake    Reviewed: Allergy & Precautions, NPO status , Patient's Chart, lab work & pertinent test results  Airway Mallampati: II  TM Distance: >3 FB Neck ROM: Full    Dental  (+) Teeth Intact, Dental Advisory Given   Pulmonary    breath sounds clear to auscultation       Cardiovascular hypertension,  Rhythm:Regular Rate:Normal     Neuro/Psych    GI/Hepatic   Endo/Other    Renal/GU      Musculoskeletal   Abdominal (+) + obese,   Peds  Hematology   Anesthesia Other Findings   Reproductive/Obstetrics                             Anesthesia Physical Anesthesia Plan  ASA: III  Anesthesia Plan: General   Post-op Pain Management:    Induction: Intravenous  Airway Management Planned: Oral ETT and LMA  Additional Equipment:   Intra-op Plan:   Post-operative Plan:   Informed Consent: I have reviewed the patients History and Physical, chart, labs and discussed the procedure including the risks, benefits and alternatives for the proposed anesthesia with the patient or authorized representative who has indicated his/her understanding and acceptance.   Dental advisory given  Plan Discussed with: CRNA and Anesthesiologist  Anesthesia Plan Comments:         Anesthesia Quick Evaluation

## 2016-01-14 NOTE — ED Notes (Addendum)
Called report to Short Stay RN. Pt being transported to OR

## 2016-01-15 LAB — CBC
HCT: 36.9 % — ABNORMAL LOW (ref 39.0–52.0)
Hemoglobin: 11.7 g/dL — ABNORMAL LOW (ref 13.0–17.0)
MCH: 29 pg (ref 26.0–34.0)
MCHC: 31.7 g/dL (ref 30.0–36.0)
MCV: 91.3 fL (ref 78.0–100.0)
Platelets: 122 10*3/uL — ABNORMAL LOW (ref 150–400)
RBC: 4.04 MIL/uL — ABNORMAL LOW (ref 4.22–5.81)
RDW: 16.8 % — ABNORMAL HIGH (ref 11.5–15.5)
WBC: 7.1 10*3/uL (ref 4.0–10.5)

## 2016-01-15 LAB — BASIC METABOLIC PANEL
Anion gap: 12 (ref 5–15)
BUN: 22 mg/dL — ABNORMAL HIGH (ref 6–20)
CO2: 25 mmol/L (ref 22–32)
Calcium: 8.4 mg/dL — ABNORMAL LOW (ref 8.9–10.3)
Chloride: 95 mmol/L — ABNORMAL LOW (ref 101–111)
Creatinine, Ser: 6.41 mg/dL — ABNORMAL HIGH (ref 0.61–1.24)
GFR calc Af Amer: 11 mL/min — ABNORMAL LOW (ref >60)
GFR calc non Af Amer: 9 mL/min — ABNORMAL LOW (ref >60)
Glucose, Bld: 103 mg/dL — ABNORMAL HIGH (ref 65–99)
Potassium: 4.5 mmol/L (ref 3.5–5.1)
Sodium: 132 mmol/L — ABNORMAL LOW (ref 135–145)

## 2016-01-15 LAB — POTASSIUM: Potassium: 5.4 mmol/L — ABNORMAL HIGH (ref 3.5–5.1)

## 2016-01-15 MED ORDER — RENA-VITE PO TABS
1.0000 | ORAL_TABLET | Freq: Every day | ORAL | Status: DC
  Administered 2016-01-15 – 2016-01-20 (×6): 1 via ORAL
  Filled 2016-01-15 (×6): qty 1

## 2016-01-15 MED ORDER — HEPARIN SODIUM (PORCINE) 1000 UNIT/ML DIALYSIS: 1000.0000 [IU] | INTRAMUSCULAR | Status: DC | PRN

## 2016-01-15 MED ORDER — LIDOCAINE-PRILOCAINE 2.5-2.5 % EX CREA: 1.0000 "application " | TOPICAL_CREAM | CUTANEOUS | Status: DC | PRN

## 2016-01-15 MED ORDER — SODIUM CHLORIDE 0.9 % IV SOLN: 100.0000 mL | INTRAVENOUS | Status: DC | PRN

## 2016-01-15 MED ORDER — ALTEPLASE 2 MG IJ SOLR: 2.0000 mg | Freq: Once | INTRAMUSCULAR | Status: DC | PRN

## 2016-01-15 MED ORDER — PENTAFLUOROPROP-TETRAFLUOROETH EX AERO: 1.0000 "application " | INHALATION_SPRAY | CUTANEOUS | Status: DC | PRN

## 2016-01-15 MED ORDER — LIDOCAINE HCL (PF) 1 % IJ SOLN: 5.0000 mL | INTRAMUSCULAR | Status: DC | PRN

## 2016-01-15 MED ORDER — HEPARIN SODIUM (PORCINE) 1000 UNIT/ML DIALYSIS: 20.0000 [IU]/kg | INTRAMUSCULAR | Status: DC | PRN

## 2016-01-15 MED ORDER — DEXTROSE 5 % IV SOLN
1.5000 g | Freq: Two times a day (BID) | INTRAVENOUS | Status: AC
  Administered 2016-01-15: 1.5 g via INTRAVENOUS
  Filled 2016-01-15: qty 1.5

## 2016-01-15 NOTE — Progress Notes (Signed)
Graft assessment from 0800 & 1200 today - Thrill is weak and able to hear bruit using ascultation with stethoscope Some swelling noted around site - warm to touch Pt taking PO pain meds for low ABD/Groin discomfort

## 2016-01-15 NOTE — Progress Notes (Signed)
Called report to [employer] on [place] . Pt aware he will be transferring to 2 West bed 14 per bed.

## 2016-01-15 NOTE — Progress Notes (Signed)
Nutrition Brief Note  Received consult that pt requesting more protein with his meals. RD reviewed chart and will put in order for patient to have double protein portions with all meals.  Please consult RD if further assistance is needed.    Koleen Distance, RD, LDN Pager #- [phone]

## 2016-01-15 NOTE — Consult Note (Signed)
[facility] Renal Consultation Note    Indication for Consultation:  Management of ESRD/hemodialysis; anemia, hypertension/volume and secondary hyperparathyroidism  HPI: [name] is a 51 y.o. male with ESRD on hemodialysis since 1997. Dialyzes at [facility] MWF. Patient admitted from ED from kidney center yesterday with clotted left thigh AV graft unable to use for HD. He has had prior revisions of this graft, last revision in 02/2015. He underwent extensive declot and revision of the graft with Dr. Donzetta Matters last evening. 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 R femoral TDC was placed. Patient reports history of multiple upper extremity access failures.  Currently he reports left leg pain, had HD yesterday evening without complaints. Asking about getting more food to eat at dinner. Denies fever, chills, nausea, vomiting chest pain, SOB.   Past Medical History:  Diagnosis Date  . Anemia   . Anxiety   . End stage renal failure on dialysis ([place])   . GERD (gastroesophageal reflux disease)   . Headache(784.0)    migraines  . Hypertension    hx of - has not taken bp meds in over 2 years  . Hypothyroidism   . Insomnia   . Kidney stones   . Seizures ([place])    last 1998  . Shortness of breath    rarely  . Spina bifida [facility])    Past Surgical History:  Procedure Laterality Date  . APPENDECTOMY    . ARTERIOVENOUS GRAFT PLACEMENT Left 10/16/2012   left femoral goretex graft         Dr Donnetta Hutching  . AV FISTULA PLACEMENT Left 06/27/2012   Procedure: EXPLORATORY LEFT THY-GRAFT PSEUDO-ANEURYSM;  Surgeon: Conrad , MD;  Location: [place];  Service: Vascular;  Laterality: Left;  Revision of left Arteriovenus gortex graft in thigh.  . COLONOSCOPY    . INCISION AND DRAINAGE Left 10/16/2012   Procedure: INCISION AND Debridement left thigh graft;  Surgeon: Rosetta Posner, MD;   Location: [place];  Service: Vascular;  Laterality: Left;  . INSERTION OF DIALYSIS CATHETER    . KNEE SURGERY Right    patella and tendon  . LITHOTRIPSY     x 3  . REVISION OF ARTERIOVENOUS GORETEX GRAFT Left 10/16/2012   Procedure: REVISION OF LEFT FEMORAL LOOP ARTERIOVENOUS GORETEX GRAFT;  Surgeon: Rosetta Posner, MD;  Location: [place];  Service: Vascular;  Laterality: Left;  . REVISION OF ARTERIOVENOUS GORETEX GRAFT Left 02/11/2015   Procedure: EXCISION OF SMALL SEGMENT OF EXPOSED LEFT THIGH NON FUNCTIONING  ARTERIOVENOUS GORETEX GRAFT;  Surgeon: Mal Misty, MD;  [facility];  Service: Vascular;  Laterality: Left;   Family History  Problem Relation Age of Onset  . Diabetes Mother   . Hypertension Mother   . Heart disease Mother     before age 39  . Diabetes Father   . Heart attack Father     X's 3  . Diabetes Sister   . Bipolar disorder Sister    Social History:  reports that he has never smoked. He has never used smokeless tobacco. He reports that he does not drink alcohol or use drugs. Allergies  Allergen Reactions  . Furadantin [Nitrofurantoin] Other (See Comments)    unknown  . Mandelamine [Methenamine] Other (See Comments)    unknown  . Noroxin [Norfloxacin] Other (See Comments)    unknown  . Carmine Nausea Only  . Contrast Media [Iodinated Diagnostic  Agents] Nausea And Vomiting    Oral dye causes vomiting, IV dye is okay  . Hydrocodone Other (See Comments)    Pt states that after 3 weeks of taking this medication he will began to "twitch"  . Metrizamide Nausea And Vomiting    Oral dye causes vomiting, IV dye is okay  . Sulfa Antibiotics Cough  . Sulfur Cough   Prior to Admission medications   Medication Sig Start Date End Date Taking? Authorizing Provider  acetaminophen (TYLENOL) 325 MG tablet Take 650 mg by mouth 3 (three) times a week. Takes at dialysis treatments (m/W/F)    Historical Provider, MD  aspirin 325 MG tablet Take 325 mg by mouth daily.     Historical Provider, MD  azelastine (ASTELIN) 137 MCG/SPRAY nasal spray Place 1 spray into the nose 2 (two) times daily. Use in each nostril as directed    Historical Provider, MD  calcium acetate (PHOSLO) 667 MG capsule Take 1,334-2,668 mg by mouth 3 (three) times daily with meals. 4 capsules with meals, 3 with snacks    Historical Provider, MD  Cinacalcet HCl (SENSIPAR PO) Take by mouth. Reported on 03/09/2015    Historical Provider, MD  DiphenhydrAMINE HCl (BENADRYL ALLERGY PO) Take by mouth 3 (three) times a week. Three times weekly at dialysis (M/W/F)    Historical Provider, MD  famotidine (PEPCID) 40 MG tablet Take 40 mg by mouth daily.    Historical Provider, MD  fluticasone (FLONASE) 50 MCG/ACT nasal spray Place 2 sprays into the nose daily.    Historical Provider, MD  furosemide (LASIX) 80 MG tablet Take 80 mg by mouth 2 (two) times daily.    Historical Provider, MD  levothyroxine (SYNTHROID, LEVOTHROID) 125 MCG tablet Take 125 mcg by mouth daily before breakfast.    Historical Provider, MD  LORazepam (ATIVAN) 0.5 MG tablet Take 0.5 mg by mouth daily.    Historical Provider, MD  oxyCODONE-acetaminophen (PERCOCET) 10-325 MG tablet Take 1 tablet by mouth every 4 (four) hours as needed for pain. 02/11/15   Samantha J Rhyne, PA-C  promethazine (PHENERGAN) 25 MG tablet Take 25 mg by mouth every 6 (six) hours as needed for nausea.    Historical Provider, MD  sodium polystyrene (KAYEXALATE) powder Take 15 g by mouth 2 (two) times a week. Saturday and Sunday    Historical Provider, MD  traZODone (DESYREL) 100 MG tablet Take 100 mg by mouth at bedtime.    Historical Provider, MD  zolpidem (AMBIEN) 10 MG tablet Take 10 mg by mouth at bedtime as needed for sleep.    Historical Provider, MD   Current Facility-Administered Medications  Medication Dose Route Frequency Provider Last Rate Last Dose  . 0.9 %  sodium chloride infusion   Intravenous Continuous Roberts Gaudy, MD 10 mL/hr at 01/14/16 1514    . 0.9  %  sodium chloride infusion  250 mL Intravenous PRN Alvia Grove, PA-C      . acetaminophen (TYLENOL) tablet 325-650 mg  325-650 mg Oral Q4H PRN Alvia Grove, PA-C       Or  . acetaminophen (TYLENOL) suppository 325-650 mg  325-650 mg Rectal Q4H PRN Alvia Grove, PA-C      . [START ON 01/17/2016] acetaminophen (TYLENOL) tablet 650 mg  650 mg Oral Once per day on Mon Wed Fri Kimberly A Trinh, PA-C      . alum & mag hydroxide-simeth (MAALOX/MYLANTA) 200-200-20 MG/5ML suspension 15-30 mL  15-30 mL Oral Q2H PRN Alvia Grove, PA-C      .  azelastine (ASTELIN) 0.1 % nasal spray 1 spray  1 spray Each Nare BID Alvia Grove, PA-C      . bisacodyl (DULCOLAX) suppository 10 mg  10 mg Rectal Daily PRN Alvia Grove, PA-C      . calcium acetate (PHOSLO) capsule 1,334 mg  1,334 mg Oral PRN Waynetta Sandy, MD      . calcium acetate (PHOSLO) capsule 2,668 mg  2,668 mg Oral TID WC Waynetta Sandy, MD      . clopidogrel (PLAVIX) tablet 75 mg  75 mg Oral Daily Alvia Grove, PA-C   75 mg at 01/15/16 1016  . docusate sodium (COLACE) capsule 100 mg  100 mg Oral Daily Alvia Grove, PA-C      . famotidine (PEPCID) tablet 40 mg  40 mg Oral Daily Alvia Grove, PA-C   40 mg at 01/15/16 1016  . fluticasone (FLONASE) 50 MCG/ACT nasal spray 2 spray  2 spray Each Nare Daily Alvia Grove, PA-C      . furosemide (LASIX) tablet 80 mg  80 mg Oral BID Alvia Grove, PA-C   80 mg at 01/15/16 1016  . guaiFENesin-dextromethorphan (ROBITUSSIN DM) 100-10 MG/5ML syrup 15 mL  15 mL Oral Q4H PRN Alvia Grove, PA-C      . heparin injection 5,000 Units  5,000 Units Subcutaneous Q8H Alvia Grove, PA-C   5,000 Units at 01/15/16 3419  . hydrALAZINE (APRESOLINE) injection 5 mg  5 mg Intravenous Q20 Min PRN Alvia Grove, PA-C      . labetalol (NORMODYNE,TRANDATE) injection 10 mg  10 mg Intravenous Q10 min PRN Alvia Grove, PA-C      . levothyroxine (SYNTHROID, LEVOTHROID)  tablet 125 mcg  125 mcg Oral QAC breakfast Alvia Grove, PA-C   125 mcg at 01/15/16 1015  . LORazepam (ATIVAN) tablet 0.5 mg  0.5 mg Oral Daily Alvia Grove, PA-C   0.5 mg at 01/15/16 1016  . metoprolol (LOPRESSOR) injection 2-5 mg  2-5 mg Intravenous Q2H PRN Alvia Grove, PA-C      . morphine 2 MG/ML injection 2-5 mg  2-5 mg Intravenous Q1H PRN Alvia Grove, PA-C      . ondansetron (ZOFRAN) injection 4 mg  4 mg Intravenous Q6H PRN Alvia Grove, PA-C      . oxyCODONE-acetaminophen (PERCOCET/ROXICET) 5-325 MG per tablet 1 tablet  1 tablet Oral Q4H PRN Waynetta Sandy, MD   1 tablet at 01/15/16 1034   And  . oxyCODONE (Oxy IR/ROXICODONE) immediate release tablet 5 mg  5 mg Oral Q4H PRN Waynetta Sandy, MD   5 mg at 01/15/16 1034  . pantoprazole (PROTONIX) EC tablet 40 mg  40 mg Oral Daily Alvia Grove, PA-C   40 mg at 01/15/16 1016  . phenol (CHLORASEPTIC) mouth spray 1 spray  1 spray Mouth/Throat PRN Alvia Grove, PA-C      . potassium chloride SA (K-DUR,KLOR-CON) CR tablet 20-40 mEq  20-40 mEq Oral Once Alvia Grove, PA-C      . promethazine (PHENERGAN) tablet 25 mg  25 mg Oral Q6H PRN Alvia Grove, PA-C      . sodium chloride flush (NS) 0.9 % injection 3 mL  3 mL Intravenous Q12H Alvia Grove, PA-C   3 mL at 01/15/16 0457  . sodium chloride flush (NS) 0.9 % injection 3 mL  3 mL Intravenous PRN Alvia Grove, PA-C      . [  START ON 01/17/2016] sodium polystyrene (KAYEXALATE) 15 GM/60ML suspension 15 g  15 g Oral Once per day on Mon Thu Waynetta Sandy, MD      . traZODone (DESYREL) tablet 100 mg  100 mg Oral QHS Alvia Grove, PA-C      . zolpidem (AMBIEN) tablet 10 mg  10 mg Oral QHS PRN Alvia Grove, PA-C       Labs: Basic Metabolic Panel:  Recent Labs Lab 01/14/16 1300 01/14/16 2353 01/15/16 0417  NA 132*  --  132*  K 4.8 5.4* 4.5  CL 90*  --  95*  CO2 23  --  25  GLUCOSE 84  --  103*  BUN 53*  --  22*   CREATININE 10.66*  --  6.41*  CALCIUM 8.7*  --  8.4*   Liver Function Tests: No results for input(s): AST, ALT, ALKPHOS, BILITOT, PROT, ALBUMIN in the last 168 hours. No results for input(s): LIPASE, AMYLASE in the last 168 hours. No results for input(s): AMMONIA in the last 168 hours. CBC:  Recent Labs Lab 01/14/16 1300 01/15/16 0417  WBC 6.0 7.1  NEUTROABS 4.3  --   HGB 13.4 11.7*  HCT 42.0 36.9*  MCV 91.3 91.3  PLT 112* 122*   Cardiac Enzymes: No results for input(s): CKTOTAL, CKMB, CKMBINDEX, TROPONINI in the last 168 hours. CBG: No results for input(s): GLUCAP in the last 168 hours. Iron Studies: No results for input(s): IRON, TIBC, TRANSFERRIN, FERRITIN in the last 72 hours. Studies/Results: No results found.  ROS: As per HPI otherwise negative.  Physical Exam: Vitals:   01/15/16 0412 01/15/16 0603 01/15/16 0604 01/15/16 0800  BP: (!) 84/56 (!) 105/50 (!) 105/50 103/64  Pulse: 80 82 84   Resp:      Temp: 98.8 F (37.1 C)   98.6 F (37 C)  TempSrc: Oral   Oral  SpO2: 93% 95% 98%   Weight:      Height:         General: Obese WM NAD Head: NCAT sclera not icteric MMM Neck: Supple.  Lungs: CTA bilaterally without wheezes, rales, or rhonchi. Breathing is unlabored. Heart: RRR with S1 S2.  Abdomen: soft NT + BS   Lower extremities: Left thigh with surgical dressing, swelling; Right thigh TDC Neuro: A & O  X 3. Moves all extremities spontaneously. Psych:  Responds to questions appropriately with a normal affect. Dialysis Access: R femoral TDC  Dialysis Orders:  AF MWF 4h 51mins BFR 450 1K/2.5 Ca EDW 125.5 kg  -Hectorol 2 mcg IV q HD -Heparin 4100 U IV Bolus   Assessment: 1.  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  ESRD -  MWF - HD yesterday/  on 1K bath /kayexlate at center - K+ WNL during  admit  3.  Hypertension/volume  - BP soft, asymptomatic -no BP meds / below EDW by weights here, no gross volume on exam  4.  Anemia  - Hgb 11.7 post op - follow no OP ESA  5.  Metabolic bone disease -  VDRA/PhosLo binder  6.  Nutrition - renal diet/vitamins/protein supp  Plan - next HD 1/15 at center     Lynnda Child PA-C [place] Pager [phone] 01/15/2016, 11:49 AM   Pt seen, examined and agree w  A/P as above. ESRD patient with clotted thigh AV graft, had declot and required angioplasty and stenting of L iliac vein.  Had new R TDC placed and required angioplasty of R iliac vein as well.  Probably for dc tomorrow.  Had HD here last night postop.   [name] MD [employer] pager [phone]   01/15/2016, 6:26 PM

## 2016-01-15 NOTE — Progress Notes (Addendum)
[facility] Progress Note  Subjective  - POD #1  Pain with left thigh  Objective Vitals:   01/15/16 0603 01/15/16 0604  BP: (!) 105/50 (!) 105/50  Pulse: 82 84  Resp:    Temp:      Intake/Output Summary (Last 24 hours) at 01/15/16 0741 Last data filed at 01/15/16 0458  Gross per 24 hour  Intake             2150 ml  Output             2030 ml  Net              120 ml   Left thigh incisions clean. No palpable hematoma.  Audible bruit lateral left thigh graft, unable to auscultate bruit medially, but this is probably due to swelling.  Right femoral TDC.  Assessment/Planning: 51 y.o. male is s/p:  1.  Thromboembolectomy of left thigh graft 2.  Revision of left thigh av graft with 32mm propatent graft 3.  shuntogram with central venogram 4.  ivus of ivc, left common iliac vein, left external iliac vein 5.  Stent of left common and external iliac vein with 14x90 wallstent 6.  Stent of venous anastomosis with 8 x 50 viabahn 7.  US guided cannulation of right common femoral vein 8.  Balloon angioplasty of right common and external iliac veins with 57mm balloon 9.  Placement of 55cm tunneled dialysis catheter.  1 Day Post-Op   -Had HD last night after OR. -Do not cannulate new left thigh graft for 4 weeks. If incisions breakdown, patient is at risk for graft infection given close proximity of graft to skin surface. He understands that there are no further options for revision to his left thigh graft.  -Still having significant pain. BP soft but no tachycardia and asymptomatic. Will keep one more day and transfer out of stepdown.   -Plavix started yesterday for iliac vein stent.  -Plan d/c tomorrow.  Alvia Grove 01/15/2016 7:41 AM --  Laboratory CBC    Component Value Date/Time   WBC 7.1 01/15/2016 0417   HGB 11.7 (L) 01/15/2016 0417   HCT 36.9 (L) 01/15/2016 0417   PLT 122 (L) 01/15/2016 0417    BMET    Component Value Date/Time   NA 132 (L)  01/15/2016 0417   K 4.5 01/15/2016 0417   CL 95 (L) 01/15/2016 0417   CO2 25 01/15/2016 0417   GLUCOSE 103 (H) 01/15/2016 0417   BUN 22 (H) 01/15/2016 0417   CREATININE 6.41 (H) 01/15/2016 0417   CALCIUM 8.4 (L) 01/15/2016 0417   GFRNONAA 9 (L) 01/15/2016 0417   GFRAA 11 (L) 01/15/2016 0417    COAG Lab Results  Component Value Date   INR 1.05 01/14/2016   INR 1.1 08/23/2008   No results found for: PTT  Antibiotics Anti-infectives    Start     Dose/Rate Route Frequency Ordered Stop   01/15/16 0500  cefUROXime (ZINACEF) 1.5 g in dextrose 5 % 50 mL IVPB     1.5 g 100 mL/hr over 30 Minutes Intravenous Every 12 hours 01/15/16 0445 01/15/16 0528   01/15/16 0000  cefUROXime (ZINACEF) 1.5 g in dextrose 5 % 50 mL IVPB  Status:  Discontinued     1.5 g 100 mL/hr over 30 Minutes Intravenous Every 12 hours 01/14/16 2310 01/15/16 0445   01/14/16 1522  vancomycin (VANCOCIN) 1-5 GM/200ML-% IVPB    Comments:  Vira Agar, Beth   : cabinet  override      01/14/16 1522 01/15/16 [place], PA-C [facility] Office: [phone] Pager: [phone] 01/15/2016 7:41 AM    I have interviewed patient with PA and agree with assessment and plan above. Possible d/c tomorrow.   Brandon C. Donzetta Matters, MD [facility] [place] Office: [phone] Pager: [phone]

## 2016-01-16 ENCOUNTER — Inpatient Hospital Stay (HOSPITAL_COMMUNITY): Payer: Medicare Other

## 2016-01-16 NOTE — Anesthesia Postprocedure Evaluation (Addendum)
Anesthesia Post Note  Patient: [name]  Procedure(s) Performed: Procedure(s) (LRB): THROMBECTOMY ARTERIOVENOUS GORE-TEX Left thigh GRAFT (Left) INTRA OPERATIVE ARTERIOGRAM (Left) REVISION OF ARTERIOVENOUS GORETEX GRAFT (Left) INSERTION OF DIALYSIS CATHETER A/V SHUNTOGRAM INSERTION OF ILIAC STENT (Left)  Patient location during evaluation: PACU Anesthesia Type: General Level of consciousness: awake and alert Pain management: pain level controlled Vital Signs Assessment: post-procedure vital signs reviewed and stable Respiratory status: spontaneous breathing, nonlabored ventilation, respiratory function stable and patient connected to nasal cannula oxygen Cardiovascular status: blood pressure returned to baseline and stable Postop Assessment: no signs of nausea or vomiting Anesthetic complications: no       Last Vitals:  Vitals:   01/16/16 0436 01/16/16 0531  BP: (!) 67/21 (!) 90/50  Pulse: 86 85  Resp: 20   Temp: 36.9 C     Last Pain:  Vitals:   01/16/16 0804  TempSrc:   PainSc: 2                  Perel Hauschild S

## 2016-01-16 NOTE — Progress Notes (Signed)
Pt's BPs very soft this evening, though better with manual cuff reading. Pt is asymptomatic and HR has been stable in the 80s all night. Pt states this is his norm, and states "the machines never get my blood pressure right". And that he "knows when they're really low". Will continue to closely monitor patient.  Jaymes Graff, RN

## 2016-01-16 NOTE — Progress Notes (Signed)
[name] Progress Note   Subjective: having upper airway difficulties, hard to catch his breath, cutting off in the throat area  Vitals:   01/15/16 2141 01/15/16 2152 01/16/16 0436 01/16/16 0531  BP: (!) 83/56 (!) 96/50 (!) 67/21 (!) 90/50  Pulse:   86 85  Resp:   20   Temp:   98.4 F (36.9 C)   TempSrc:   Oral   SpO2:   100%   Weight:      Height:        Inpatient medications: . [START ON 01/17/2016] acetaminophen  650 mg Oral Once per day on Mon Wed Fri  . azelastine  1 spray Each Nare BID  . calcium acetate  2,668 mg Oral TID WC  . clopidogrel  75 mg Oral Daily  . docusate sodium  100 mg Oral Daily  . famotidine  40 mg Oral Daily  . fluticasone  2 spray Each Nare Daily  . furosemide  80 mg Oral BID  . heparin  5,000 Units Subcutaneous Q8H  . levothyroxine  125 mcg Oral QAC breakfast  . LORazepam  0.5 mg Oral Daily  . multivitamin  1 tablet Oral QHS  . pantoprazole  40 mg Oral Daily  . potassium chloride  20-40 mEq Oral Once  . sodium chloride flush  3 mL Intravenous Q12H  . traZODone  100 mg Oral QHS   . sodium chloride 10 mL/hr at 01/14/16 1514   sodium chloride, acetaminophen **OR** acetaminophen, alum & mag hydroxide-simeth, bisacodyl, calcium acetate, guaiFENesin-dextromethorphan, hydrALAZINE, labetalol, metoprolol, morphine injection, ondansetron, oxyCODONE-acetaminophen **AND** oxyCODONE, phenol, promethazine, sodium chloride flush, zolpidem  Exam: Alert, no distress, no stridor No jvd Chest clear bilat to bases RRR no rmg Abd obese soft ntnd L thigh +bruit, wounds are clean R thigh TDC Ext no edema  Dialysis: AF MWF 4h 58mins BFR 450 1K/2.5 Ca EDW 125.5 kg  -Hectorol 2 mcg IV q HD -Heparin 4100 U IV Bolus       Assessment: 1.  SOB - onset yesterday, lungs are clear, possibly having upper airway issue related to surg/ intubation. Will get CXR and d/w primary.  2. Clotted L thigh AVG - declot/revision with Dr Donzetta Matters 1/12-  venous  stenosis and left iliac vein total occulsion requiring stents,  R femoral TDC placed 1/12 - per VVS do not cannulate AVG for 4 weeks  3.  ESRD -  MWF HD 4.  Hypertension/volume  - not on any BP medication at home. BP's on the low side here and below dry wt by 9 kg according to bed weights. Can't stand, is WC bound.  5.  Anemia  - Hgb 11.7 post op - follow no OP ESA  6.  Metabolic bone disease -  VDRA/PhosLo binder  7.  Nutrition - renal diet/vitamins/protein supp  Plan - as above   [name] MD [place] pager [phone]   01/16/2016, 7:20 AM    Recent Labs Lab 01/14/16 1300 01/14/16 2353 01/15/16 0417  NA 132*  --  132*  K 4.8 5.4* 4.5  CL 90*  --  95*  CO2 23  --  25  GLUCOSE 84  --  103*  BUN 53*  --  22*  CREATININE 10.66*  --  6.41*  CALCIUM 8.7*  --  8.4*   No results for input(s): AST, ALT, ALKPHOS, BILITOT, PROT, ALBUMIN in the last 168 hours.  Recent Labs Lab 01/14/16 1300 01/15/16 0417  WBC 6.0 7.1  NEUTROABS 4.3  --   HGB 13.4 11.7*  HCT 42.0 36.9*  MCV 91.3 91.3  PLT 112* 122*   Iron/TIBC/Ferritin/ %Sat No results found for: IRON, TIBC, FERRITIN, IRONPCTSAT

## 2016-01-16 NOTE — Progress Notes (Addendum)
  [facility] Progress Note  Subjective  - POD #2  Having some trouble breathing. Started yesterday. Feels like throat is swollen. Also "seeing stars." Denies any visual blurriness.   Objective Vitals:   01/16/16 0436 01/16/16 0531  BP: (!) 67/21 (!) 90/50  Pulse: 86 85  Resp: 20   Temp: 98.4 F (36.9 C)     Intake/Output Summary (Last 24 hours) at 01/16/16 0804 Last data filed at 01/15/16 1400  Gross per 24 hour  Intake              400 ml  Output                0 ml  Net              400 ml   Lungs clear bilaterally. No accessory muscle use. No stridor.  RRR Left thigh incisions clean and intact.  Audible bruit left thigh AVG   Assessment/Planning: 51 y.o. male is s/p: new left thigh AVG, left iliac vein stent 2 Days Post-Op   SOB: 02 sats are normal. Do not suspect allergic reaction. Anxiety also exacerbating symptoms. Lungs clear. CXR pending.  Will keep overnight for observation with plans for HD tomorrow.  Left AVG patent  Alvia Grove 01/16/2016 8:04 AM --  Laboratory CBC    Component Value Date/Time   WBC 7.1 01/15/2016 0417   HGB 11.7 (L) 01/15/2016 0417   HCT 36.9 (L) 01/15/2016 0417   PLT 122 (L) 01/15/2016 0417    BMET    Component Value Date/Time   NA 132 (L) 01/15/2016 0417   K 4.5 01/15/2016 0417   CL 95 (L) 01/15/2016 0417   CO2 25 01/15/2016 0417   GLUCOSE 103 (H) 01/15/2016 0417   BUN 22 (H) 01/15/2016 0417   CREATININE 6.41 (H) 01/15/2016 0417   CALCIUM 8.4 (L) 01/15/2016 0417   GFRNONAA 9 (L) 01/15/2016 0417   GFRAA 11 (L) 01/15/2016 0417    COAG Lab Results  Component Value Date   INR 1.05 01/14/2016   INR 1.1 08/23/2008   No results found for: PTT  Antibiotics Anti-infectives    Start     Dose/Rate Route Frequency Ordered Stop   01/15/16 0500  cefUROXime (ZINACEF) 1.5 g in dextrose 5 % 50 mL IVPB     1.5 g 100 mL/hr over 30 Minutes Intravenous Every 12 hours 01/15/16 0445 01/15/16 0528   01/15/16 0000  cefUROXime (ZINACEF) 1.5 g in dextrose 5 % 50 mL IVPB  Status:  Discontinued     1.5 g 100 mL/hr over 30 Minutes Intravenous Every 12 hours 01/14/16 2310 01/15/16 0445   01/14/16 1522  vancomycin (VANCOCIN) 1-5 GM/200ML-% IVPB    Comments:  Vira Agar, Beth   : cabinet override      01/14/16 1522 01/15/16 0329       Virgina Jock, PA-C [facility] Office: [phone] Pager: [phone] 01/16/2016 8:04 AM    I have independently interviewed patient and agree with PA assessment and plan above. CXR pending, appreciate nephrology assistance. Likely dialysis tomorrow and discharge if symptoms improve.   Kongmeng Santoro C. Donzetta Matters, MD [facility] [place] Office: [phone] Pager: [phone]

## 2016-01-17 ENCOUNTER — Telehealth: Payer: Self-pay | Admitting: Vascular Surgery

## 2016-01-17 ENCOUNTER — Encounter (HOSPITAL_COMMUNITY): Payer: Self-pay | Admitting: Vascular Surgery

## 2016-01-17 LAB — RENAL FUNCTION PANEL
Albumin: 2.7 g/dL — ABNORMAL LOW (ref 3.5–5.0)
Anion gap: 14 (ref 5–15)
BUN: 52 mg/dL — ABNORMAL HIGH (ref 6–20)
CO2: 25 mmol/L (ref 22–32)
Calcium: 8.7 mg/dL — ABNORMAL LOW (ref 8.9–10.3)
Chloride: 94 mmol/L — ABNORMAL LOW (ref 101–111)
Creatinine, Ser: 11.71 mg/dL — ABNORMAL HIGH (ref 0.61–1.24)
GFR calc Af Amer: 5 mL/min — ABNORMAL LOW (ref >60)
GFR calc non Af Amer: 4 mL/min — ABNORMAL LOW (ref >60)
Glucose, Bld: 102 mg/dL — ABNORMAL HIGH (ref 65–99)
Phosphorus: 5.3 mg/dL — ABNORMAL HIGH (ref 2.5–4.6)
Potassium: 5 mmol/L (ref 3.5–5.1)
Sodium: 133 mmol/L — ABNORMAL LOW (ref 135–145)

## 2016-01-17 LAB — CBC
HCT: 30 % — ABNORMAL LOW (ref 39.0–52.0)
Hemoglobin: 9.4 g/dL — ABNORMAL LOW (ref 13.0–17.0)
MCH: 28.7 pg (ref 26.0–34.0)
MCHC: 31.3 g/dL (ref 30.0–36.0)
MCV: 91.7 fL (ref 78.0–100.0)
Platelets: 114 10*3/uL — ABNORMAL LOW (ref 150–400)
RBC: 3.27 MIL/uL — ABNORMAL LOW (ref 4.22–5.81)
RDW: 17.1 % — ABNORMAL HIGH (ref 11.5–15.5)
WBC: 7.5 10*3/uL (ref 4.0–10.5)

## 2016-01-17 MED ORDER — OXYCODONE-ACETAMINOPHEN 10-325 MG PO TABS: 1.0000 | ORAL_TABLET | Freq: Four times a day (QID) | ORAL | 0 refills | Status: DC | PRN

## 2016-01-17 MED ORDER — CLOPIDOGREL BISULFATE 75 MG PO TABS: 75.0000 mg | ORAL_TABLET | Freq: Every day | ORAL | 3 refills | Status: DC

## 2016-01-17 MED ORDER — MORPHINE SULFATE (PF) 4 MG/ML IV SOLN
INTRAVENOUS | Status: AC
  Filled 2016-01-17: qty 1

## 2016-01-17 MED ORDER — OXYCODONE HCL 5 MG PO TABS
ORAL_TABLET | ORAL | Status: AC
  Filled 2016-01-17: qty 1

## 2016-01-17 MED ORDER — OXYCODONE-ACETAMINOPHEN 5-325 MG PO TABS
ORAL_TABLET | ORAL | Status: AC
  Filled 2016-01-17: qty 1

## 2016-01-17 NOTE — Progress Notes (Signed)
  Progress Note    01/17/2016 9:09 AM 3 Days Post-Op  Subjective:  Breathing improved, no acute issues.  Vitals:   01/17/16 0800 01/17/16 0830  BP: (!) 95/50 (!) 90/38  Pulse: 84 80  Resp: 18 13  Temp:      Physical Exam: aaox3 Right femoral tdc cdi Left groin incisions cdi, graft with pulsatility  CBC    Component Value Date/Time   WBC 7.5 01/17/2016 0752   RBC 3.27 (L) 01/17/2016 0752   HGB 9.4 (L) 01/17/2016 0752   HCT 30.0 (L) 01/17/2016 0752   PLT 114 (L) 01/17/2016 0752   MCV 91.7 01/17/2016 0752   MCH 28.7 01/17/2016 0752   MCHC 31.3 01/17/2016 0752   RDW 17.1 (H) 01/17/2016 0752   LYMPHSABS 0.9 01/14/2016 1300   MONOABS 0.6 01/14/2016 1300   EOSABS 0.1 01/14/2016 1300   BASOSABS 0.0 01/14/2016 1300    BMET    Component Value Date/Time   NA 133 (L) 01/17/2016 0752   K 5.0 01/17/2016 0752   CL 94 (L) 01/17/2016 0752   CO2 25 01/17/2016 0752   GLUCOSE 102 (H) 01/17/2016 0752   BUN 52 (H) 01/17/2016 0752   CREATININE 11.71 (H) 01/17/2016 0752   CALCIUM 8.7 (L) 01/17/2016 0752   GFRNONAA 4 (L) 01/17/2016 0752   GFRAA 5 (L) 01/17/2016 0752    INR    Component Value Date/Time   INR 1.05 01/14/2016 1300     Intake/Output Summary (Last 24 hours) at 01/17/16 0909 Last data filed at 01/16/16 1645  Gross per 24 hour  Intake              240 ml  Output                0 ml  Net              240 ml     Assessment:  51 y.o. male is s/p left thigh graft thrombectomy with stenting of venous outflow and left iliac vein with tdc in right groin  Plan: Dialysis today and possible d/c home Will have f/u in 3-4 weeks to evaluate wounds and left thigh graft   Brandon C. Donzetta Matters, MD [facility] [place] Office: [phone] Pager: [phone]  01/17/2016 9:09 AM

## 2016-01-17 NOTE — Telephone Encounter (Signed)
Sched appt 02/16/16 at 3:15. Spoke to pt to inform them of appt.

## 2016-01-17 NOTE — Telephone Encounter (Signed)
-----   Message from Mena Goes, RN sent at 01/17/2016  9:58 AM EST ----- Regarding: may be another message from MD   ----- Message ----- From: Alvia Grove, PA-C Sent: 01/17/2016   9:51 AM To: Vvs Charge Pool  left thigh graft thrombectomy with stenting of venous outflow and left iliac vein with tdc in right groin 01/14/2016  F/u with Dr. Donzetta Matters in 3-4 weeks to evaluate wounds.  Thanks Maudie Mercury

## 2016-01-17 NOTE — Care Management Note (Signed)
Case Management Note Marvetta Gibbons RN, BSN Unit 2W-Case Manager [phone]  Patient Details  Name: [name] MRN: [id number] Date of Birth: 1965-06-20  Subjective/Objective:   Pt admitted  s/p left thigh graft thrombectomy with stenting of venous outflow and left iliac vein with tdc in right groin                 Action/Plan: PTA pt lived at home- HD pt M/W/F- at [facility]- plan to return home- no CM needs noted.   Expected Discharge Date:  01/17/16               Expected Discharge Plan:  Home/Self Care  In-House Referral:     Discharge planning Services  CM Consult  Post Acute Care Choice:    Choice offered to:     DME Arranged:    DME Agency:     HH Arranged:    HH Agency:     Status of Service:  Completed, signed off  If discussed at [employer] of Stay Meetings, dates discussed:    Additional Comments:  Dawayne Patricia, RN 01/17/2016, 11:42 AM

## 2016-01-17 NOTE — Procedures (Signed)
I have seen and examined this patient and agree with the plan of care  Patient seen on dialysis with no complaints   BP tends to be a little low   K 4.5    CO2   25      Ca  8.4    Hb 11.7    Low lung volumes    51 y.o. male is s/p left thigh graft thrombectomy with stenting of venous outflow and left iliac vein with tdc in right groin   Samaad Hashem W 01/17/2016, 10:12 AM

## 2016-01-18 ENCOUNTER — Inpatient Hospital Stay (HOSPITAL_COMMUNITY): Payer: Medicare Other

## 2016-01-18 MED ORDER — MIDODRINE HCL 5 MG PO TABS
5.0000 mg | ORAL_TABLET | Freq: Three times a day (TID) | ORAL | Status: DC
  Administered 2016-01-18 – 2016-01-21 (×11): 5 mg via ORAL
  Filled 2016-01-18 (×8): qty 1

## 2016-01-18 MED ORDER — MIDODRINE HCL 5 MG PO TABS: 5.0000 mg | ORAL_TABLET | Freq: Three times a day (TID) | ORAL | 6 refills | Status: DC

## 2016-01-18 NOTE — Progress Notes (Addendum)
  [facility] Progress Note  Subjective  - POD #4  Left thigh hurts. Denies SOB.  Objective Vitals:   01/17/16 2155 01/18/16 0510  BP: (!) 87/34 (!) 88/34  Pulse: 72 74  Resp: 18 18  Temp: 98.3 F (36.8 C) 98.4 F (36.9 C)    Intake/Output Summary (Last 24 hours) at 01/18/16 1601 Last data filed at 01/17/16 1125  Gross per 24 hour  Intake                0 ml  Output                0 ml  Net                0 ml   Lungs clear Left thigh incisions intact, audible bruit left thigh graft  Assessment/Planning: 51 y.o. male is s/p left thigh graft thrombectomy with stenting of venous outflow and left iliac vein with tdc in right groin 4 Days Post-Op   Breathing issues resolved. BP stable. Baseline BP is low.  Left thigh graft patent. D/c home today. Will have case manager see patient before d/c per patient request for assistance at home.   [name] 01/18/2016 7:38 AM --  Laboratory CBC    Component Value Date/Time   WBC 7.5 01/17/2016 0752   HGB 9.4 (L) 01/17/2016 0752   HCT 30.0 (L) 01/17/2016 0752   PLT 114 (L) 01/17/2016 0752    BMET    Component Value Date/Time   NA 133 (L) 01/17/2016 0752   K 5.0 01/17/2016 0752   CL 94 (L) 01/17/2016 0752   CO2 25 01/17/2016 0752   GLUCOSE 102 (H) 01/17/2016 0752   BUN 52 (H) 01/17/2016 0752   CREATININE 11.71 (H) 01/17/2016 0752   CALCIUM 8.7 (L) 01/17/2016 0752   GFRNONAA 4 (L) 01/17/2016 0752   GFRAA 5 (L) 01/17/2016 0752    COAG Lab Results  Component Value Date   INR 1.05 01/14/2016   INR 1.1 08/23/2008   No results found for: PTT  Antibiotics Anti-infectives    Start     Dose/Rate Route Frequency Ordered Stop   01/15/16 0500  cefUROXime (ZINACEF) 1.5 g in dextrose 5 % 50 mL IVPB     1.5 g 100 mL/hr over 30 Minutes Intravenous Every 12 hours 01/15/16 0445 01/15/16 0528   01/15/16 0000  cefUROXime (ZINACEF) 1.5 g in dextrose 5 % 50 mL IVPB  Status:  Discontinued     1.5  g 100 mL/hr over 30 Minutes Intravenous Every 12 hours 01/14/16 2310 01/15/16 0445   01/14/16 1522  vancomycin (VANCOCIN) 1-5 GM/200ML-% IVPB    Comments:  Vira Agar, Beth   : cabinet override      01/14/16 1522 01/15/16 0329       Virgina Jock, PA-C [facility] Office: [phone] Pager: [phone] 01/18/2016 7:38 AM  I have independently interviewed patient and agree with PA assessment and plan above. Patient not feeling well at time of my interview. Graft is patent and will need to follow for wound check in a few weeks. Will have nephrology evaluate for hypotension and ongoing breathing issues. Is ok for discharge from surgical standpoint but will clear up medical issues first.  Erlene Quan C. Donzetta Matters, MD [facility] [place] Office: [phone] Pager: [phone]

## 2016-01-18 NOTE — Care Management Important Message (Signed)
Important Message  Patient Details  Name: [name] MRN: [id number] Date of Birth: Jun 19, 1965   Medicare Important Message Given:  Yes    Nathen May 01/18/2016, 12:18 PM

## 2016-01-18 NOTE — Progress Notes (Signed)
Subjective:  Tolerated HD yesterday  But lowish bps had been on Midodrine in past , will start today / some uri cos'  Objective Vital signs in last 24 hours: Vitals:   01/17/16 1528 01/17/16 1748 01/17/16 2155 01/18/16 0510  BP: (!) 95/32 (!) 101/51 (!) 87/34 (!) 88/34  Pulse: 86 87 72 74  Resp: 18 20 18 18   Temp: 99 F (37.2 C) 98.5 F (36.9 C) 98.3 F (36.8 C) 98.4 F (36.9 C)  TempSrc: Oral Oral Oral Oral  SpO2: 100%  100% 100%  Weight:    119.3 kg (263 lb)  Height:       Weight change:   Physical Exam: General: alert obese wm  nad  ox3 Heart: RRR no mur, rug or gal. Lungs: CTA Abdomen: obese, soft NT , ND  Extremities: no pedal edema  Dialysis Access:  Left Fem AVGG pos bruit/  R Thigh perm cath    Dialysis: AF MWF 4h 22mins BFR 4501K/2.5 Ca EDW 125.5 kg  -Hectorol 2 mcg IV q HD -Heparin 4100 U IV Bolus                  Problem/Plan: 1. Clotted L thigh AVG- declot/revision with Dr Donzetta Matters 1/12- venous stenosis and left iliac vein total occulsion requiring stents, R femoral TDC placed 1/12 - per VVS do not cannulate AVG for 4 weeks  2. ESRD- MWF HD on schedule  3. Hypertension/volume- not on any BP meds / start Midodrine 5mg  tid today / Volume okay (cannot compare  BEDS wts with op wt but is  at home. BP's on the low side here and below dry wt by 6 kg according to bed weights. Can't stand, is WC bound. 4. SOB -  resolved / CXR 1/14 =no excess vol /   5. Anemia- Hgb 11.7 > 9.4 post op - no OP ESA > start aranesp in am HD  6. Metabolic bone disease- VDRA/PhosLo binder  7. Nutrition- renal diet/vitamins/protein supp  Ernest Haber, PA-C [facility] Beeper [phone] 01/18/2016,11:18 AM  LOS: 4 days   Labs: Basic Metabolic Panel:  Recent Labs Lab 01/14/16 1300 01/14/16 2353 01/15/16 0417 01/17/16 0752  NA 132*  --  132* 133*  K 4.8 5.4* 4.5 5.0  CL 90*  --  95* 94*  CO2 23  --  25 25  GLUCOSE 84  --  103* 102*  BUN 53*  --  22*  52*  CREATININE 10.66*  --  6.41* 11.71*  CALCIUM 8.7*  --  8.4* 8.7*  PHOS  --   --   --  5.3*   Liver Function Tests:  Recent Labs Lab 01/17/16 0752  ALBUMIN 2.7*   No results for input(s): LIPASE, AMYLASE in the last 168 hours. No results for input(s): AMMONIA in the last 168 hours. CBC:  Recent Labs Lab 01/14/16 1300 01/15/16 0417 01/17/16 0752  WBC 6.0 7.1 7.5  NEUTROABS 4.3  --   --   HGB 13.4 11.7* 9.4*  HCT 42.0 36.9* 30.0*  MCV 91.3 91.3 91.7  PLT 112* 122* 114*   Cardiac Enzymes: No results for input(s): CKTOTAL, CKMB, CKMBINDEX, TROPONINI in the last 168 hours. CBG: No results for input(s): GLUCAP in the last 168 hours.  Studies/Results: No results found. Medications:  . acetaminophen  650 mg Oral Once per day on Mon Wed Fri  . azelastine  1 spray Each Nare BID  . calcium acetate  2,668 mg Oral TID WC  .  clopidogrel  75 mg Oral Daily  . docusate sodium  100 mg Oral Daily  . famotidine  40 mg Oral Daily  . fluticasone  2 spray Each Nare Daily  . furosemide  80 mg Oral BID  . heparin  5,000 Units Subcutaneous Q8H  . levothyroxine  125 mcg Oral QAC breakfast  . LORazepam  0.5 mg Oral Daily  . multivitamin  1 tablet Oral QHS  . pantoprazole  40 mg Oral Daily  . potassium chloride  20-40 mEq Oral Once  . sodium chloride flush  3 mL Intravenous Q12H  . traZODone  100 mg Oral QHS

## 2016-01-18 NOTE — Care Management Note (Signed)
Case Management Note Marvetta Gibbons RN, BSN Unit 2W-Case Manager [phone]  Patient Details  Name: [name] MRN: [id number] Date of Birth: [birth year]  Subjective/Objective:   Pt admitted  s/p left thigh graft thrombectomy with stenting of venous outflow and left iliac vein with tdc in right groin                 Action/Plan: PTA pt lived at home- HD pt M/W/F- at [facility]- plan to return home- no CM needs noted.   Expected Discharge Date:  01/18/16               Expected Discharge Plan:  Home/Self Care  In-House Referral:     Discharge planning Services  CM Consult  Post Acute Care Choice:    Choice offered to:     DME Arranged:    DME Agency:     HH Arranged:    HH Agency:     Status of Service:  Completed, signed off  If discussed at [employer] of Stay Meetings, dates discussed:    Discharge Disposition: home/self care   Additional Comments:  01/18/16- 1430- Marvetta Gibbons RN, CM- referral received regarding pt asking about aide assistance at home for discharge- spoke with pt at bedside- pt states that he is looking for someone to come help him with cooking and cleaning- explained to pt his insurance would not cover this type of assistance- would have to be an out of pocket expense- insurance only covers skilled [place] needs- list of private duty agencies provided to pt per pt request- that pt can f/u on finding assistance if needed that he would have to pay out of pocket for.   Dawayne Patricia, RN 01/18/2016, 2:38 PM

## 2016-01-18 NOTE — Care Management Important Message (Signed)
Important Message  Patient Details  Name: [name] MRN: [id number] Date of Birth: 06-03-1965   Medicare Important Message Given:  Yes    Nathen May 01/18/2016, 12:19 PM

## 2016-01-18 NOTE — Progress Notes (Signed)
  [facility] Progress Note  Spoke with Dr. Justin Mend earlier today regarding low BP. Renal will start milrinone today.  Checked in with patient via phone this afternoon. He reported some shortness of breath and productive cough. 02 sats 100%. Will order CXR and sputum culture. He is afebrile. Unsure cause of SOB. Will keep overnight. Plan for HD tomorrow.   Virgina Jock, PA-C [facility] Office: [phone] Pager: [phone] 01/18/2016 1:55 PM

## 2016-01-19 LAB — RENAL FUNCTION PANEL
Albumin: 3 g/dL — ABNORMAL LOW (ref 3.5–5.0)
Anion gap: 17 — ABNORMAL HIGH (ref 5–15)
BUN: 43 mg/dL — ABNORMAL HIGH (ref 6–20)
CO2: 23 mmol/L (ref 22–32)
Calcium: 9.4 mg/dL (ref 8.9–10.3)
Chloride: 92 mmol/L — ABNORMAL LOW (ref 101–111)
Creatinine, Ser: 10.79 mg/dL — ABNORMAL HIGH (ref 0.61–1.24)
GFR calc Af Amer: 6 mL/min — ABNORMAL LOW (ref >60)
GFR calc non Af Amer: 5 mL/min — ABNORMAL LOW (ref >60)
Glucose, Bld: 97 mg/dL (ref 65–99)
Phosphorus: 4.7 mg/dL — ABNORMAL HIGH (ref 2.5–4.6)
Potassium: 4.8 mmol/L (ref 3.5–5.1)
Sodium: 132 mmol/L — ABNORMAL LOW (ref 135–145)

## 2016-01-19 LAB — HEMOGLOBIN AND HEMATOCRIT, BLOOD
HCT: 28.5 % — ABNORMAL LOW (ref 39.0–52.0)
Hemoglobin: 8.8 g/dL — ABNORMAL LOW (ref 13.0–17.0)

## 2016-01-19 LAB — CBC
HCT: 23.9 % — ABNORMAL LOW (ref 39.0–52.0)
Hemoglobin: 7.4 g/dL — ABNORMAL LOW (ref 13.0–17.0)
MCH: 28.9 pg (ref 26.0–34.0)
MCHC: 31 g/dL (ref 30.0–36.0)
MCV: 93.4 fL (ref 78.0–100.0)
Platelets: 169 10*3/uL (ref 150–400)
RBC: 2.56 MIL/uL — ABNORMAL LOW (ref 4.22–5.81)
RDW: 17.7 % — ABNORMAL HIGH (ref 11.5–15.5)
WBC: 5.6 10*3/uL (ref 4.0–10.5)

## 2016-01-19 LAB — EXPECTORATED SPUTUM ASSESSMENT W GRAM STAIN, RFLX TO RESP C

## 2016-01-19 LAB — EXPECTORATED SPUTUM ASSESSMENT W REFEX TO RESP CULTURE

## 2016-01-19 MED ORDER — DARBEPOETIN ALFA 200 MCG/0.4ML IJ SOSY
200.0000 ug | PREFILLED_SYRINGE | INTRAMUSCULAR | Status: DC
  Administered 2016-01-19: 200 ug via INTRAVENOUS
  Filled 2016-01-19: qty 0.4

## 2016-01-19 MED ORDER — MIDODRINE HCL 5 MG PO TABS
ORAL_TABLET | ORAL | Status: AC
  Filled 2016-01-19: qty 1

## 2016-01-19 MED ORDER — DARBEPOETIN ALFA 200 MCG/0.4ML IJ SOSY
PREFILLED_SYRINGE | INTRAMUSCULAR | Status: AC
  Filled 2016-01-19: qty 0.4

## 2016-01-19 NOTE — Progress Notes (Addendum)
[facility] [place]  Subjective  - Currently on HD.  No SOB, feeling congested and producing sputum.    Objective (!) 92/49 79 98.4 F (36.9 C) (Oral) 15 96%  Intake/Output Summary (Last 24 hours) at 01/19/16 1118 Last data filed at 01/18/16 1700  Gross per 24 hour  Intake              360 ml  Output                0 ml  Net              360 ml   Left thigh incisions intact, audible bruit left thigh graft Non labored breathing   Assessment/Planning: POD # 5  s/p left thigh graft thrombectomy with stenting of venous outflow and left iliac vein with tdc in right groin  No SOB Plan to D/C home today if we can help with transportation per patient.  Will have case manager see patient before d/c per patient request for assistance at home.  Laurence Slate [facility] 01/19/2016 11:18 AM --  Laboratory Lab Results:  Recent Labs  01/17/16 0752 01/19/16 0740 01/19/16 0845  WBC 7.5 5.6  --   HGB 9.4* 7.4* 8.8*  HCT 30.0* 23.9* 28.5*  PLT 114* 169  --    BMET  Recent Labs  01/17/16 0752 01/19/16 0740  NA 133* 132*  K 5.0 4.8  CL 94* 92*  CO2 25 23  GLUCOSE 102* 97  BUN 52* 43*  CREATININE 11.71* 10.79*  CALCIUM 8.7* 9.4    COAG Lab Results  Component Value Date   INR 1.05 01/14/2016   INR 1.1 08/23/2008   No results found for: PTT

## 2016-01-19 NOTE — Clinical Social Work Note (Signed)
Patient medically stable for discharge and is being transported home by ambulance.  Delvina Mizzell Givens, MSW, LCSW Licensed Clinical Social Worker [place] [phone]

## 2016-01-19 NOTE — Progress Notes (Addendum)
Patient concerned that he still has a cough and feels short of breath. I asked patient to show me sputum he had just forcefully coughed up and scant amount of clear mucus. The amount was not enough or acceptable for sputum culture.  Patient states that he felt he was going to "have a seizure" after he coughed that he felt "funny". Patient in no visible respiratory distress or discomfort.  Oxygen saturations 100% on room air. Patient is anxious but ativan was given earlier after HD. Pt resting with call bell within reach.  Will continue to monitor. Payton Emerald, RN

## 2016-01-19 NOTE — Procedures (Signed)
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

## 2016-01-19 NOTE — Addendum Note (Signed)
Addendum  created 01/19/16 0518 by Izora Gala, CRNA   Anesthesia Intra Meds edited

## 2016-01-19 NOTE — Progress Notes (Signed)
Patient concerned about going home with snow outside. Due to inability to walk 30 feet to door, ambulance will be called for transportation.  Patient has been made aware. Payton Emerald, RN

## 2016-01-19 NOTE — Progress Notes (Signed)
Called PA Silva Bandy and explained that patient did not want to go home.  He felt he was not ready for discharge. PA states if patient does not want to go home keep he overnight. I called CCMD made aware. Patient has one peripheral IV acces in place.  Pt resting with call bell within reach.  Will continue to monitor. Payton Emerald, RN

## 2016-01-19 NOTE — Progress Notes (Signed)
Patient ID: [name], male   DOB: 10-Jul-1965, 51 y.o.   MRN: [id number] The patient feels he is not comfortable from a respiratory standpoint for discharge. Reports that he felt that he was beginning to have some congestion prior to his admission for a clotted graft last week. Remains afebrile. Chest x-ray has been unremarkable. Will not plan for discharge today. Repeat chest x-ray in a.m.

## 2016-01-20 ENCOUNTER — Inpatient Hospital Stay (HOSPITAL_COMMUNITY): Payer: Medicare Other

## 2016-01-20 LAB — BLOOD GAS, ARTERIAL
Acid-Base Excess: 2.2 mmol/L — ABNORMAL HIGH (ref 0.0–2.0)
Bicarbonate: 25.4 mmol/L (ref 20.0–28.0)
Drawn by: 30136
O2 Content: 2 L/min
O2 Saturation: 99 %
Patient temperature: 98.6
pCO2 arterial: 33.9 mmHg (ref 32.0–48.0)
pH, Arterial: 7.488 — ABNORMAL HIGH (ref 7.350–7.450)
pO2, Arterial: 147 mmHg — ABNORMAL HIGH (ref 83.0–108.0)

## 2016-01-20 LAB — RENAL FUNCTION PANEL
Albumin: 3 g/dL — ABNORMAL LOW (ref 3.5–5.0)
Anion gap: 11 (ref 5–15)
BUN: 30 mg/dL — ABNORMAL HIGH (ref 6–20)
CO2: 29 mmol/L (ref 22–32)
Calcium: 9.4 mg/dL (ref 8.9–10.3)
Chloride: 96 mmol/L — ABNORMAL LOW (ref 101–111)
Creatinine, Ser: 7.82 mg/dL — ABNORMAL HIGH (ref 0.61–1.24)
GFR calc Af Amer: 8 mL/min — ABNORMAL LOW (ref >60)
GFR calc non Af Amer: 7 mL/min — ABNORMAL LOW (ref >60)
Glucose, Bld: 110 mg/dL — ABNORMAL HIGH (ref 65–99)
Phosphorus: 3.1 mg/dL (ref 2.5–4.6)
Potassium: 4.4 mmol/L (ref 3.5–5.1)
Sodium: 136 mmol/L (ref 135–145)

## 2016-01-20 LAB — CBC
HCT: 30.6 % — ABNORMAL LOW (ref 39.0–52.0)
Hemoglobin: 9.1 g/dL — ABNORMAL LOW (ref 13.0–17.0)
MCH: 28.4 pg (ref 26.0–34.0)
MCHC: 29.7 g/dL — ABNORMAL LOW (ref 30.0–36.0)
MCV: 95.6 fL (ref 78.0–100.0)
Platelets: 175 10*3/uL (ref 150–400)
RBC: 3.2 MIL/uL — ABNORMAL LOW (ref 4.22–5.81)
RDW: 17.7 % — ABNORMAL HIGH (ref 11.5–15.5)
WBC: 5.5 10*3/uL (ref 4.0–10.5)

## 2016-01-20 MED ORDER — SODIUM CHLORIDE 0.9 % IV SOLN: 100.0000 mL | INTRAVENOUS | Status: DC | PRN

## 2016-01-20 MED ORDER — LIDOCAINE HCL (PF) 1 % IJ SOLN: 5.0000 mL | INTRAMUSCULAR | Status: DC | PRN

## 2016-01-20 MED ORDER — MIDODRINE HCL 5 MG PO TABS
ORAL_TABLET | ORAL | Status: AC
  Administered 2016-01-20: 5 mg via ORAL
  Filled 2016-01-20: qty 1

## 2016-01-20 MED ORDER — PENTAFLUOROPROP-TETRAFLUOROETH EX AERO: 1.0000 "application " | INHALATION_SPRAY | CUTANEOUS | Status: DC | PRN

## 2016-01-20 MED ORDER — LIDOCAINE-PRILOCAINE 2.5-2.5 % EX CREA: 1.0000 "application " | TOPICAL_CREAM | CUTANEOUS | Status: DC | PRN

## 2016-01-20 MED ORDER — ALTEPLASE 2 MG IJ SOLR: 2.0000 mg | Freq: Once | INTRAMUSCULAR | Status: DC | PRN

## 2016-01-20 MED ORDER — FAMOTIDINE 20 MG PO TABS: 20.0000 mg | ORAL_TABLET | Freq: Every day | ORAL | Status: DC

## 2016-01-20 MED ORDER — HEPARIN SODIUM (PORCINE) 1000 UNIT/ML DIALYSIS: 1000.0000 [IU] | INTRAMUSCULAR | Status: DC | PRN

## 2016-01-20 NOTE — Procedures (Signed)
Patient was seen on dialysis and the procedure was supervised.  BFR 350  Via PC BP is  104/69.   Patient appears to be tolerating treatment well  [name] A 01/20/2016

## 2016-01-20 NOTE — Progress Notes (Signed)
[facility] [place]  Subjective  - Still coughing worried about breathing.   Objective (!) 90/47 78 98.5 F (36.9 C) (Oral) 18 98%  Intake/Output Summary (Last 24 hours) at 01/20/16 0808 Last data filed at 01/19/16 1700  Gross per 24 hour  Intake              240 ml  Output              405 ml  Net             -165 ml   DG chest IMPRESSION: Question femoral catheter with tip in right atrium. This catheter is not well visualized. Mild left base atelectasis. No edema or consolidation. Stable cardiac silhouette.  Chest clear to auscultation, no wheezing  Non labored breathing Left thigh incision healing well audible bruit   Assessment/Planning: POD # 6 s/p left thigh graft thrombectomy with stenting of venous outflow and left iliac vein with tdc in right groin  Will have IS to bedside and for home Question about pulmonologist consult will discuss with Dr. Donnetta Hutching Hopefully he can be discharged home today  [name] [facility] 01/20/2016 8:08 AM --  Laboratory Lab Results:  Recent Labs  01/19/16 0740 01/19/16 0845  WBC 5.6  --   HGB 7.4* 8.8*  HCT 23.9* 28.5*  PLT 169  --    BMET  Recent Labs  01/19/16 0740  NA 132*  K 4.8  CL 92*  CO2 23  GLUCOSE 97  BUN 43*  CREATININE 10.79*  CALCIUM 9.4    COAG Lab Results  Component Value Date   INR 1.05 01/14/2016   INR 1.1 08/23/2008   No results found for: PTT

## 2016-01-20 NOTE — Progress Notes (Signed)
[facility] ROUNDING NOTE   Subjective:   Interval History: dry non productive cough and not able to complete conversation without dyspnea   Blood pressure has been an issue and and has has limited ultrafiltration   Objective:  Vital signs in last 24 hours:  Temp:  [98.5 F (36.9 C)-98.7 F (37.1 C)] 98.5 F (36.9 C) (01/18 0426) Pulse Rate:  [62-100] 78 (01/18 0426) Resp:  [15-18] 18 (01/18 0426) BP: (90-130)/(42-84) 90/47 (01/18 0426) SpO2:  [95 %-100 %] 98 % (01/18 0426) Weight:  [120.6 kg (265 lb 14 oz)-123.7 kg (272 lb 11.3 oz)] 123.7 kg (272 lb 11.3 oz) (01/18 0426)  Weight change: -0.927 kg (-2 lb 0.7 oz) Filed Weights   01/19/16 0717 01/19/16 1147 01/20/16 0426  Weight: 121 kg (266 lb 12.1 oz) 120.6 kg (265 lb 14 oz) 123.7 kg (272 lb 11.3 oz)    Intake/Output: I/O last 3 completed shifts: In: 240 [P.O.:240] Out: 405 [Other:405]   Intake/Output this shift:  No intake/output data recorded.  CVS- RRR systolic murmurs   Using oxygen RS- CTA some rhonchi and wheezes  ABD- BS present soft non-distended EXT- no edema   Basic Metabolic Panel:  Recent Labs Lab 01/14/16 1300 01/14/16 2353 01/15/16 0417 01/17/16 0752 01/19/16 0740  NA 132*  --  132* 133* 132*  K 4.8 5.4* 4.5 5.0 4.8  CL 90*  --  95* 94* 92*  CO2 23  --  25 25 23   GLUCOSE 84  --  103* 102* 97  BUN 53*  --  22* 52* 43*  CREATININE 10.66*  --  6.41* 11.71* 10.79*  CALCIUM 8.7*  --  8.4* 8.7* 9.4  PHOS  --   --   --  5.3* 4.7*    Liver Function Tests:  Recent Labs Lab 01/17/16 0752 01/19/16 0740  ALBUMIN 2.7* 3.0*   No results for input(s): LIPASE, AMYLASE in the last 168 hours. No results for input(s): AMMONIA in the last 168 hours.  CBC:  Recent Labs Lab 01/14/16 1300 01/15/16 0417 01/17/16 0752 01/19/16 0740 01/19/16 0845  WBC 6.0 7.1 7.5 5.6  --   NEUTROABS 4.3  --   --   --   --   HGB 13.4 11.7* 9.4* 7.4* 8.8*  HCT 42.0 36.9* 30.0* 23.9* 28.5*  MCV 91.3  91.3 91.7 93.4  --   PLT 112* 122* 114* 169  --     Cardiac Enzymes: No results for input(s): CKTOTAL, CKMB, CKMBINDEX, TROPONINI in the last 168 hours.  BNP: Invalid input(s): POCBNP  CBG: No results for input(s): GLUCAP in the last 168 hours.  Microbiology: Results for orders placed or performed during the hospital encounter of 01/14/16  Culture, expectorated sputum-assessment     Status: None   Collection Time: 01/18/16  1:56 PM  Result Value Ref Range Status   Specimen Description EXPECTORATED SPUTUM  Final   Special Requests NONE  Final   Sputum evaluation   Final    Sputum specimen not acceptable for testing.  Please recollect.   RESULT CALLED TO, READ BACK BY AND VERIFIED WITH: S BURTON 01/19/16 @ 35 M VESTAL    Report Status 01/19/2016 FINAL  Final    Coagulation Studies: No results for input(s): LABPROT, INR in the last 72 hours.  Urinalysis: No results for input(s): COLORURINE, LABSPEC, PHURINE, GLUCOSEU, HGBUR, BILIRUBINUR, KETONESUR, PROTEINUR, UROBILINOGEN, NITRITE, LEUKOCYTESUR in the last 72 hours.  Invalid input(s): APPERANCEUR    Imaging: Dg Chest Port 1  View  Result Date: 01/20/2016 CLINICAL DATA:  Cough for 1 week.  End-stage renal disease EXAM: PORTABLE CHEST 1 VIEW COMPARISON:  January 18, 2016 FINDINGS: There is mild left base atelectasis. There is no edema or consolidation. Heart is upper normal in size with pulmonary vascularity within normal limits. No evident adenopathy. There is a catheter either in or overlying the right atrium which may arise from a femoral location. This catheter is incompletely visualized. IMPRESSION: Question femoral catheter with tip in right atrium. This catheter is not well visualized. Mild left base atelectasis. No edema or consolidation. Stable cardiac silhouette. Electronically Signed   By: Lowella Grip III M.D.   On: 01/20/2016 07:07   Dg Chest Port 1 View  Result Date: 01/18/2016 CLINICAL DATA:  Shortness of  breath and productive cough for 6 days. History of hypertension and end-stage renal disease. EXAM: PORTABLE CHEST 1 VIEW COMPARISON:  01/16/2016 FINDINGS: Inferior approach catheter again terminates over the right atrium, poorly visualized due to technique. The cardiomediastinal silhouette is unchanged and within normal limits. Lung volumes remain diminished with new mild diffuse interstitial accentuation bilaterally. No overt alveolar edema, sizable pleural effusion, or pneumothorax is identified. IMPRESSION: Low lung volumes with new mild diffuse interstitial accentuation which may reflect early edema. Electronically Signed   By: Logan Bores M.D.   On: 01/18/2016 15:42     Medications:    . acetaminophen  650 mg Oral Once per day on Mon Wed Fri  . azelastine  1 spray Each Nare BID  . calcium acetate  2,668 mg Oral TID WC  . clopidogrel  75 mg Oral Daily  . darbepoetin (ARANESP) injection - DIALYSIS  200 mcg Intravenous Q Wed-HD  . docusate sodium  100 mg Oral Daily  . famotidine  40 mg Oral Daily  . fluticasone  2 spray Each Nare Daily  . furosemide  80 mg Oral BID  . heparin  5,000 Units Subcutaneous Q8H  . levothyroxine  125 mcg Oral QAC breakfast  . LORazepam  0.5 mg Oral Daily  . midodrine  5 mg Oral TID WC  . multivitamin  1 tablet Oral QHS  . pantoprazole  40 mg Oral Daily  . potassium chloride  20-40 mEq Oral Once  . sodium chloride flush  3 mL Intravenous Q12H  . traZODone  100 mg Oral QHS   sodium chloride, acetaminophen **OR** acetaminophen, bisacodyl, calcium acetate, guaiFENesin-dextromethorphan, hydrALAZINE, labetalol, metoprolol, morphine injection, ondansetron, oxyCODONE-acetaminophen **AND** oxyCODONE, phenol, promethazine, sodium chloride flush, zolpidem  Assessment/ Plan:  1. 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 ESRD- MWF HD on schedule  3. Hypertension/volume- appears to maybe volume overloaded today will perform extra dialysis today   Added midodrine to help maintain BP Can't stand, is WC bound. 4. SOB - Appears worse today  5. Anemia-  Hb drop into 7's post op   Added aranesp  6. Metabolic bone disease- VDRA/PhosLo binder  7. Nutrition- renal diet/vitamins/protein supp     LOS: 6 [place] W @TODAY @9 :35 AM

## 2016-01-21 LAB — RENAL FUNCTION PANEL
Albumin: 3.2 g/dL — ABNORMAL LOW (ref 3.5–5.0)
Anion gap: 12 (ref 5–15)
BUN: 14 mg/dL (ref 6–20)
CO2: 27 mmol/L (ref 22–32)
Calcium: 9 mg/dL (ref 8.9–10.3)
Chloride: 96 mmol/L — ABNORMAL LOW (ref 101–111)
Creatinine, Ser: 4.34 mg/dL — ABNORMAL HIGH (ref 0.61–1.24)
GFR calc Af Amer: 17 mL/min — ABNORMAL LOW (ref >60)
GFR calc non Af Amer: 15 mL/min — ABNORMAL LOW (ref >60)
Glucose, Bld: 112 mg/dL — ABNORMAL HIGH (ref 65–99)
Phosphorus: 1.8 mg/dL — ABNORMAL LOW (ref 2.5–4.6)
Potassium: 3.5 mmol/L (ref 3.5–5.1)
Sodium: 135 mmol/L (ref 135–145)

## 2016-01-21 LAB — CBC
HCT: 32.1 % — ABNORMAL LOW (ref 39.0–52.0)
Hemoglobin: 9.6 g/dL — ABNORMAL LOW (ref 13.0–17.0)
MCH: 28.2 pg (ref 26.0–34.0)
MCHC: 29.9 g/dL — ABNORMAL LOW (ref 30.0–36.0)
MCV: 94.4 fL (ref 78.0–100.0)
Platelets: 190 10*3/uL (ref 150–400)
RBC: 3.4 MIL/uL — ABNORMAL LOW (ref 4.22–5.81)
RDW: 17.6 % — ABNORMAL HIGH (ref 11.5–15.5)
WBC: 5.9 10*3/uL (ref 4.0–10.5)

## 2016-01-21 MED ORDER — MIDODRINE HCL 5 MG PO TABS
ORAL_TABLET | ORAL | Status: AC
  Administered 2016-01-21: 19:00:00
  Filled 2016-01-21: qty 1

## 2016-01-21 NOTE — Procedures (Signed)
I have seen and examined this patient and agree with the plan of care   BP  89/51     Hb 9.1     K 4.4   Ca 9.4  Phos 3.1  Alb 3.0   CXR  Low lung volumes      Aryannah Mohon W 01/21/2016, 5:32 PM

## 2016-01-21 NOTE — Progress Notes (Addendum)
Call placed to on call attending.  Notified attending that [facility] had not arrived to transfer Pt home and per call to [facility] unsure of when they would arrive.  Per attending "use best judgment" to determine if Pt should DC tonight or tomorrow morning.  Discussed with CN.  If [facility] has not arrived by midnight will cancel ambulance for tonight and reschedule for AM.  Discussed with Pt, Pt in agreement.  Will cont to monitor.

## 2016-01-21 NOTE — Progress Notes (Addendum)
[facility] [place]  Subjective  -  Feeling a little better and plan is another HD today.   Objective (!) 98/43 74 98 F (36.7 C) (Oral) 16 100%  Intake/Output Summary (Last 24 hours) at 01/21/16 0804 Last data filed at 01/20/16 1758  Gross per 24 hour  Intake                0 ml  Output             3773 ml  Net            -3773 ml    Non labored breathing  Left thigh graft with palpable thrill Right tunneled dialysis catheter working fine  Assessment/Planning: POD # 7  Procedure Performed: 1.  Thromboembolectomy of left thigh graft 2.  Revision of left thigh av graft with 66mm propatent graft 3.  shuntogram with central venogram 4.  ivus of ivc, left common iliac vein, left external iliac vein 5.  Stent of left common and external iliac vein with 14x90 wallstent 6.  Stent of venous anastomosis with 8 x 50 viabahn 7.  US guided cannulation of right common femoral vein 8.  Balloon angioplasty of right common and external iliac veins with 22mm balloon 9.  Placement of 55cm tunneled dialysis catheter.  Plan for discharge after HD today  [facility]rence [facility] 01/21/2016 8:04 AM --  Laboratory Lab Results:  Recent Labs  01/19/16 0740 01/19/16 0845 01/20/16 1148  WBC 5.6  --  5.5  HGB 7.4* 8.8* 9.1*  HCT 23.9* 28.5* 30.6*  PLT 169  --  175   BMET  Recent Labs  01/19/16 0740 01/20/16 1148  NA 132* 136  K 4.8 4.4  CL 92* 96*  CO2 23 29  GLUCOSE 97 110*  BUN 43* 30*  CREATININE 10.79* 7.82*  CALCIUM 9.4 9.4    COAG Lab Results  Component Value Date   INR 1.05 01/14/2016   INR 1.1 08/23/2008   No results found for: PTT  I have examined the patient, reviewed and agree with above.Resp comfortable.  States ready for [employer] post HD  Curt Jews, MD 01/21/2016 1:54 PM

## 2016-01-21 NOTE — Care Management Important Message (Signed)
Important Message  Patient Details  Name: [name] MRN: [id number] Date of Birth: [birth year]   Medicare Important Message Given:  Yes    Nathen May 01/21/2016, 4:54 PM

## 2016-01-22 NOTE — Progress Notes (Signed)
[facility] arrived.  Pt discharged with [facility].  VS stable.  NO s/s of distress.

## 2016-01-25 NOTE — Discharge Summary (Signed)
[facility] Discharge Summary   Patient ID:  [name] MRN: [id number] DOB/AGE: 51-May-1967 51 y.o.  Admit date: 01/14/2016 Discharge date: 01/21/2016 Date of Surgery: 01/14/2016 Surgeon: Surgeon(s): Waynetta Sandy, MD  Admission Diagnosis: ESRD (end stage renal disease) ([place]) [Y86.5] Complication of vascular access for dialysis, initial encounter [T82.9XXA]  Discharge Diagnoses:  ESRD (end stage renal disease) ([place]) [H84.6] Complication of vascular access for dialysis, initial encounter [T82.9XXA]  Secondary Diagnoses: Past Medical History:  Diagnosis Date  . Anemia   . Anxiety   . End stage renal failure on dialysis ([place])   . GERD (gastroesophageal reflux disease)   . Headache(784.0)    migraines  . Hypertension    hx of - has not taken bp meds in over 2 years  . Hypothyroidism   . Insomnia   . Kidney stones   . Seizures ([place])    last 1998  . Shortness of breath    rarely  . Spina bifida ([place])     Procedure(s): THROMBECTOMY ARTERIOVENOUS GORE-TEX Left thigh GRAFT INTRA OPERATIVE ARTERIOGRAM REVISION OF ARTERIOVENOUS GORETEX GRAFT INSERTION OF DIALYSIS CATHETER A/V SHUNTOGRAM INSERTION OF ILIAC STENT  Discharged Condition: good  HPI: [name] is a 51 y.o. male with ESRD on HD (MWF), who presents from his dialysis center with a clotted left thigh graft. His last dialysis center was Wednesday. He is known to [place] from having undergone prior revisions in his left thigh graft. He has a history of failed accesses in his bilateral upper extremities. He has never had access in the right leg. His primary nephrologist is Dr. Mercy Moore.   He last ate last night. He takes a daily aspirin. He is not on bloodthinners. His past medical history includes anemia, spina bifida and hypertension, all of which are stable.     Hospital Course:  [name] is a 51 y.o. male is S/P Left Procedure(s): THROMBECTOMY ARTERIOVENOUS  GORE-TEX Left thigh GRAFT INTRA OPERATIVE ARTERIOGRAM REVISION OF ARTERIOVENOUS GORETEX GRAFT INSERTION OF DIALYSIS CATHETER A/V SHUNTOGRAM INSERTION OF ILIAC STENT  Nephrology was consulted to manage HD.   He developed coughing and felt SOB.  He was having shortened HD sessions due to hypotension. He developed pulmonary edema and was placed on back to back HD prior to going home.    Breathing issues resolved. BP stable. Baseline BP is low.  Left thigh graft patent. D/c home today. After HD. Will have case manager see patient before d/c per patient request for assistance at home.    F/U in 3 weeks    Consults:  Treatment Team:  Waynetta Sandy, MD Roney Jaffe, MD  Significant Diagnostic Studies: CBC Lab Results  Component Value Date   WBC 5.9 01/21/2016   HGB 9.6 (L) 01/21/2016   HCT 32.1 (L) 01/21/2016   MCV 94.4 01/21/2016   PLT 190 01/21/2016    BMET    Component Value Date/Time   NA 135 01/21/2016 1530   K 3.5 01/21/2016 1530   CL 96 (L) 01/21/2016 1530   CO2 27 01/21/2016 1530   GLUCOSE 112 (H) 01/21/2016 1530   BUN 14 01/21/2016 1530   CREATININE 4.34 (H) 01/21/2016 1530   CALCIUM 9.0 01/21/2016 1530   GFRNONAA 15 (L) 01/21/2016 1530   GFRAA 17 (L) 01/21/2016 1530   COAG Lab Results  Component Value Date   INR 1.05 01/14/2016   INR 1.1 08/23/2008     Disposition:  Discharge to :Home Discharge Instructions    Call MD  for:  redness, tenderness, or signs of infection (pain, swelling, bleeding, redness, odor or green/yellow discharge around incision site)    Complete by:  As directed    Call MD for:  severe or increased pain, loss or decreased feeling  in affected limb(s)    Complete by:  As directed    Call MD for:  temperature >100.5    Complete by:  As directed    Discharge patient    Complete by:  As directed    Discharge after HD today pending transportation arrangements care management.   Discharge disposition:  01-Home or  Self Care   Discharge patient date:  01/21/2016   Discharge wound care:    Complete by:  As directed    Wash wounds daily with soap and water and pat dry. Do not apply any creams or ointments on your incisions. Do not pull off the skin glue. It will peel off on its own.   Keep right femoral catheter site clean.   Driving Restrictions    Complete by:  As directed    No driving while on pain medication   Increase activity slowly    Complete by:  As directed    Walk with assistance use walker or cane as needed   Lifting restrictions    Complete by:  As directed    No lifting for 2 weeks   Resume previous diet    Complete by:  As directed      Allergies as of 01/22/2016      Reactions   Furadantin [nitrofurantoin] Other (See Comments)   Unknown childhood reaction   Mandelamine [methenamine] Other (See Comments)   Unknown childhood reaction   Noroxin [norfloxacin] Other (See Comments)   Unknown childhood reaction   [place] Nausea Only   Contrast Media [iodinated Diagnostic Agents] Nausea And Vomiting   Oral dye causes vomiting, IV dye is okay   Hydrocodone Other (See Comments)   Pt states that after 3 weeks of taking this medication he will began to "twitch"   Metrizamide Nausea And Vomiting   Oral dye causes vomiting, IV dye is okay   Sulfa Antibiotics Cough   Childhood reaction - pt could not confirm that it was a cough   Sulfur Cough   Childhood reaction - pt could not confirm that it was a cough      Medication List    TAKE these medications   aspirin 325 MG tablet Take 325 mg by mouth daily at 2 PM.   ASTELIN 0.1 % nasal spray Generic drug:  azelastine Place 1 spray into both nostrils 2 (two) times daily. Use in each nostril as directed   calcium acetate 667 MG capsule Commonly known as:  PHOSLO Take 1,334-2,001 mg by mouth See admin instructions. Take 3 capsules (2001 mg) by mouth 3 times daily with meals, take 2 capsules (1334 mg) with snacks   clopidogrel 75 MG  tablet Commonly known as:  PLAVIX Take 1 tablet (75 mg total) by mouth daily.   famotidine 40 MG tablet Commonly known as:  PEPCID Take 40 mg by mouth daily at 2 PM.   fluticasone 50 MCG/ACT nasal spray Commonly known as:  FLONASE Place 2 sprays into both nostrils daily as needed (congestion).   furosemide 80 MG tablet Commonly known as:  LASIX Take 80 mg by mouth daily as needed (swelling/ shortness of breath).   levothyroxine 125 MCG tablet Commonly known as:  SYNTHROID, LEVOTHROID Take 125 mcg by mouth daily  at 2 PM.   LORazepam 0.5 MG tablet Commonly known as:  ATIVAN Take 0.5 mg by mouth at bedtime.   midodrine 5 MG tablet Commonly known as:  PROAMATINE Take 1 tablet (5 mg total) by mouth 3 (three) times daily with meals.   oxyCODONE-acetaminophen 10-325 MG tablet Commonly known as:  PERCOCET Take 1 tablet by mouth 4 (four) times daily as needed for pain.   promethazine 25 MG tablet Commonly known as:  PHENERGAN Take 25 mg by mouth 3 (three) times daily.   sodium polystyrene powder Commonly known as:  KAYEXALATE Take 15 g by mouth See admin instructions. Take 15 g (mixed in water) by mouth on Saturdays and Sundays   traZODone 100 MG tablet Commonly known as:  DESYREL Take 100 mg by mouth at bedtime.   zolpidem 10 MG tablet Commonly known as:  AMBIEN Take 10 mg by mouth at bedtime.      Verbal and written Discharge instructions given to the patient. Wound care per Discharge AVS Follow-up Information    Servando Snare, MD In 3 weeks.   Specialties:  Vascular Surgery, Cardiology Why:  Our office will call you to arrange an appointment  Contact information: [place]  [address] [phone]           Signed: Laurence Slate [facility] 01/25/2016, 7:15 AM

## 2016-01-26 ENCOUNTER — Emergency Department (HOSPITAL_COMMUNITY)
Admission: EM | Admit: 2016-01-26 | Discharge: 2016-01-26 | Disposition: A | Discharge status: Home / Self Care | Payer: Medicare Other | Attending: Emergency Medicine | Admitting: Emergency Medicine

## 2016-01-26 ENCOUNTER — Encounter (HOSPITAL_COMMUNITY): Payer: Self-pay | Admitting: Emergency Medicine

## 2016-01-26 DIAGNOSIS — I120 Hypertensive chronic kidney disease with stage 5 chronic kidney disease or end stage renal disease: Secondary | ICD-10-CM | POA: Diagnosis not present

## 2016-01-26 DIAGNOSIS — T82838A Hemorrhage of vascular prosthetic devices, implants and grafts, initial encounter: Secondary | ICD-10-CM | POA: Diagnosis not present

## 2016-01-26 DIAGNOSIS — R58 Hemorrhage, not elsewhere classified: Secondary | ICD-10-CM

## 2016-01-26 DIAGNOSIS — E039 Hypothyroidism, unspecified: Secondary | ICD-10-CM | POA: Insufficient documentation

## 2016-01-26 DIAGNOSIS — Y638 Failure in dosage during other surgical and medical care: Secondary | ICD-10-CM | POA: Insufficient documentation

## 2016-01-26 DIAGNOSIS — Z7982 Long term (current) use of aspirin: Secondary | ICD-10-CM | POA: Insufficient documentation

## 2016-01-26 DIAGNOSIS — N186 End stage renal disease: Secondary | ICD-10-CM | POA: Diagnosis not present

## 2016-01-26 NOTE — Discharge Instructions (Signed)
Your wound is not bleeding now. Please keep wound clean and covered. Follow up with your vascular doctor as needed. You also need to go for scheduled dialysis on Friday. Return to the Ed if your symptoms worsen or if you continue to bleed.

## 2016-01-26 NOTE — ED Provider Notes (Signed)
[name] DEPT Provider Note   CSN: [id number] Arrival date & time: 01/26/16  9528     History   Chief Complaint Chief Complaint  Patient presents with  . Post-op Problem    HPI [name] is a 51 y.o. male.  51 year old Caucasian male with a past medical history significant for end-stage renal disease, GERD, hypertension, AV graft to his left thigh that presents to the ED today with complaint of blood oozing from his graft site in his left thigh. Patient had a revision of his AV graft to his left thigh on 1/12. Patient was discharged from the hospital on 1/20. Patient went to dialysis today. States that he's been fine up until today when he went to bathroom he noticed that there is some oozing from one of the sites on his graft after dialysis today.. Patient states that he held pressure and the bleeding stopped. Patient does have noted blood to the bandage that was in place. Patient arrived by EMS. Patient denies any redness, warmth, pain to the site. Patient has appointment with follow-up in February. Patient denies any fever, chills headache, vision changes, lightheadedness, dizziness, lower extremity edema, or any other associated symptoms.      Past Medical History:  Diagnosis Date  . Anemia   . Anxiety   . End stage renal failure on dialysis ([place])   . GERD (gastroesophageal reflux disease)   . Headache(784.0)    migraines  . Hypertension    hx of - has not taken bp meds in over 2 years  . Hypothyroidism   . Insomnia   . Kidney stones   . Seizures ([place])    last 1998  . Shortness of breath    rarely  . Spina bifida [facility])     Patient Active Problem List   Diagnosis Date Noted  . Clotted dialysis access ([place]) 01/14/2016  . Removal of staples 07/19/2012  . Other complications due to renal dialysis device, implant, and graft 06/27/2012  . End stage renal disease ([place]) 06/27/2012    Past Surgical History:  Procedure Laterality Date  . APPENDECTOMY    .  ARTERIOVENOUS GRAFT PLACEMENT Left 10/16/2012   left femoral goretex graft         Dr Donnetta Hutching  . AV FISTULA PLACEMENT Left 06/27/2012   Procedure: EXPLORATORY LEFT THY-GRAFT PSEUDO-ANEURYSM;  Surgeon: Conrad Oldham, MD;  Location: [place];  Service: Vascular;  Laterality: Left;  Revision of left Arteriovenus gortex graft in thigh.  . COLONOSCOPY    . INCISION AND DRAINAGE Left 10/16/2012   Procedure: INCISION AND Debridement left thigh graft;  Surgeon: Rosetta Posner, MD;  Location: [place];  Service: Vascular;  Laterality: Left;  . INSERTION OF DIALYSIS CATHETER    . INSERTION OF DIALYSIS CATHETER  01/14/2016   Procedure: INSERTION OF DIALYSIS CATHETER;  Surgeon: Waynetta Sandy, MD;  Location: [place];  Service: Vascular;;  . INSERTION OF ILIAC STENT Left 01/14/2016   Procedure: INSERTION OF ILIAC STENT;  Surgeon: Waynetta Sandy, MD;  Location: [place];  Service: Vascular;  Laterality: Left;  . INTRAOPERATIVE ARTERIOGRAM Left 01/14/2016   Procedure: INTRA OPERATIVE ARTERIOGRAM;  Surgeon: Waynetta Sandy, MD;  Location: [place];  Service: Vascular;  Laterality: Left;  . KNEE SURGERY Right    patella and tendon  . LITHOTRIPSY     x 3  . PERIPHERAL VASCULAR CATHETERIZATION  01/14/2016   Procedure: A/V SHUNTOGRAM;  Surgeon: Waynetta Sandy, MD;  Location: [facility];  Service: Vascular;;  . REVISION OF ARTERIOVENOUS GORETEX GRAFT Left 10/16/2012   Procedure: REVISION OF LEFT FEMORAL LOOP ARTERIOVENOUS GORETEX GRAFT;  Surgeon: Rosetta Posner, MD;  Location: [place];  Service: Vascular;  Laterality: Left;  . REVISION OF ARTERIOVENOUS GORETEX GRAFT Left 02/11/2015   Procedure: EXCISION OF SMALL SEGMENT OF EXPOSED LEFT THIGH NON FUNCTIONING  ARTERIOVENOUS GORETEX GRAFT;  Surgeon: Mal Misty, MD;  Location: [place];  Service: Vascular;  Laterality: Left;  . REVISION OF ARTERIOVENOUS GORETEX GRAFT Left 01/14/2016   Procedure: REVISION OF ARTERIOVENOUS GORETEX GRAFT;  Surgeon: Waynetta Sandy, MD;  Location: [place];  Service: Vascular;  Laterality: Left;  . THROMBECTOMY W/ EMBOLECTOMY Left 01/14/2016   Procedure: THROMBECTOMY ARTERIOVENOUS GORE-TEX Left thigh GRAFT;  Surgeon: Waynetta Sandy, MD;  Location: [facility];  Service: Vascular;  Laterality: Left;       Home Medications    Prior to Admission medications   Medication Sig Start Date End Date Taking? Authorizing Provider  aspirin 325 MG tablet Take 325 mg by mouth daily at 2 PM.     Historical Provider, MD  azelastine (ASTELIN) 137 MCG/SPRAY nasal spray Place 1 spray into both nostrils 2 (two) times daily. Use in each nostril as directed     Historical Provider, MD  calcium acetate (PHOSLO) 667 MG capsule Take 1,334-2,001 mg by mouth See admin instructions. Take 3 capsules (2001 mg) by mouth 3 times daily with meals, take 2 capsules (1334 mg) with snacks    Historical Provider, MD  clopidogrel (PLAVIX) 75 MG tablet Take 1 tablet (75 mg total) by mouth daily. 01/17/16   Alvia Grove, PA-C  famotidine (PEPCID) 40 MG tablet Take 40 mg by mouth daily at 2 PM.     Historical Provider, MD  fluticasone (FLONASE) 50 MCG/ACT nasal spray Place 2 sprays into both nostrils daily as needed (congestion).     Historical Provider, MD  furosemide (LASIX) 80 MG tablet Take 80 mg by mouth daily as needed (swelling/ shortness of breath).     Historical Provider, MD  levothyroxine (SYNTHROID, LEVOTHROID) 125 MCG tablet Take 125 mcg by mouth daily at 2 PM.     Historical Provider, MD  LORazepam (ATIVAN) 0.5 MG tablet Take 0.5 mg by mouth at bedtime.     Historical Provider, MD  midodrine (PROAMATINE) 5 MG tablet Take 1 tablet (5 mg total) by mouth 3 (three) times daily with meals. 01/18/16   Alvia Grove, PA-C  oxyCODONE-acetaminophen (PERCOCET) 10-325 MG tablet Take 1 tablet by mouth 4 (four) times daily as needed for pain. 01/17/16   Alvia Grove, PA-C  promethazine (PHENERGAN) 25 MG tablet Take 25 mg by mouth 3  (three) times daily.     Historical Provider, MD  sodium polystyrene (KAYEXALATE) powder Take 15 g by mouth See admin instructions. Take 15 g (mixed in water) by mouth on Saturdays and Sundays    Historical Provider, MD  traZODone (DESYREL) 100 MG tablet Take 100 mg by mouth at bedtime.    Historical Provider, MD  zolpidem (AMBIEN) 10 MG tablet Take 10 mg by mouth at bedtime.     Historical Provider, MD    Family History Family History  Problem Relation Age of Onset  . Diabetes Mother   . Hypertension Mother   . Heart disease Mother     before age 72  . Diabetes Father   . Heart attack Father     X's 3  . Diabetes Sister   .  Bipolar disorder Sister     Social History Social History  Substance Use Topics  . Smoking status: Never Smoker  . Smokeless tobacco: Never Used  . Alcohol use No     Allergies   Furadantin [nitrofurantoin]; Mandelamine [methenamine]; Noroxin [norfloxacin]; Carmine; Contrast media [iodinated diagnostic agents]; Hydrocodone; Metrizamide; Sulfa antibiotics; and Sulfur   Review of Systems Review of Systems  Constitutional: Negative for chills and fever.  HENT: Negative for congestion.   Cardiovascular: Negative for leg swelling.  Gastrointestinal: Negative for abdominal pain, diarrhea, nausea and vomiting.  Skin: Positive for wound.  Neurological: Negative for dizziness, syncope, weakness, light-headedness and headaches.  All other systems reviewed and are negative.    Physical Exam Updated Vital Signs BP 106/58 (BP Location: Right Arm)   Pulse 79   Temp 98.2 F (36.8 C) (Oral)   Resp 16   Ht 5\' 11"  (1.803 m)   Wt 118.8 kg   SpO2 99%   BMI 36.54 kg/m   Physical Exam  Constitutional: He appears well-developed and well-nourished. No distress.  HENT:  Head: Normocephalic and atraumatic.  Eyes: Pupils are equal, round, and reactive to light. Right eye exhibits no discharge. Left eye exhibits no discharge. No scleral icterus.    Cardiovascular: Normal rate, regular rhythm and intact distal pulses.   Pulmonary/Chest: Effort normal and breath sounds normal. No respiratory distress.  Musculoskeletal: Normal range of motion.  Neurological: He is alert.  Skin: Skin is warm and dry. Capillary refill takes less than 2 seconds. No pallor.  Patient with AV graft noted to the left thigh. Palpable thrill. Mild erythema and the patient states is baseline. No warmth. Blood was noted to the dressing but no active bleeding. This is patient's site for dialysis. No significant edema. No purulent discharge.  Nursing note and vitals reviewed.      ED Treatments / Results  Labs (all labs ordered are listed, but only abnormal results are displayed) Labs Reviewed - No data to display  EKG  EKG Interpretation None       Radiology No results found.  Procedures Procedures (including critical care time)  Medications Ordered in ED Medications - No data to display   Initial Impression / Assessment and Plan / ED Course  I have reviewed the triage vital signs and the nursing notes.  Pertinent labs & imaging results that were available during my care of the patient were reviewed by me and considered in my medical decision making (see chart for details).     Patient presents to the ED with blood oozing from his dialysis site after dialysis today. No bleeding noted on exam. Palpable thrill. Patient denies any lightheadedness or dizziness. Only a small amount of bleeding was observed by patient. Patient is not tachycardic or hypotensive. He is hemodynamically stable. Vital signs are within normal limits. Area is not concerning for infection. Wound was cleaned and dressed. The patient were discharged home with follow-up as needed. Given strict return precautions. No pain at this time. Patient instructed to hold pressure if bleeding begins. Given strict return precautions if unable to stop the bleeding. Patient was discussed with Dr.  Darl Householder who is agreeable to the above plan. Pt is hemodynamically stable, in NAD, & able to ambulate in the ED. Pain has been managed & has no complaints prior to dc. Pt is comfortable with above plan and is stable for discharge at this time. All questions were answered prior to disposition. Strict return precautions for f/u to the ED  were discussed.   Final Clinical Impressions(s) / ED Diagnoses   Final diagnoses:  Bleeding    New Prescriptions New Prescriptions   No medications on file     Aaron Edelman 01/26/16 [place] Yao, MD 01/26/16 [phone]

## 2016-01-26 NOTE — ED Notes (Signed)
Pt departed in NAD.  

## 2016-01-26 NOTE — ED Triage Notes (Signed)
Pt had surgery the 12th to left leg. Pt noted some bleeding on his guaze. No active bleeding.

## 2016-02-10 ENCOUNTER — Encounter: Payer: Self-pay | Admitting: Vascular Surgery

## 2016-02-16 ENCOUNTER — Ambulatory Visit (INDEPENDENT_AMBULATORY_CARE_PROVIDER_SITE_OTHER): Payer: Self-pay | Admitting: Vascular Surgery

## 2016-02-16 ENCOUNTER — Encounter: Payer: Self-pay | Admitting: Vascular Surgery

## 2016-02-16 ENCOUNTER — Encounter: Payer: Medicare Other | Admitting: Vascular Surgery

## 2016-02-16 VITALS — BP 92/58 | HR 83 | Temp 99.5°F | Resp 18 | Ht 71.0 in | Wt 272.3 lb

## 2016-02-16 DIAGNOSIS — Z992 Dependence on renal dialysis: Secondary | ICD-10-CM

## 2016-02-16 DIAGNOSIS — T859XXD Unspecified complication of internal prosthetic device, implant and graft, subsequent encounter: Secondary | ICD-10-CM

## 2016-02-16 DIAGNOSIS — N186 End stage renal disease: Secondary | ICD-10-CM

## 2016-02-16 NOTE — Progress Notes (Signed)
Subjective:     Patient ID: Antonie Borjon, male   DOB: [birth year], 51 y.o.   MRN: [id number]  HPI follows up for evaluation of recent left AV graft revision with stenting of his venous outflow in the iliac vein. He has not had any fevers but has had some wound breakdown over the graft excision site. He continues on dialysis via right femoral tunneled dialysis catheter.   Review of Systems Wound on right thigh.    Objective:   Physical Exam aaox3 3x3cm area of skin necrosis R thigh New graft with signal throughout and palpable near anastomosis    Assessment/plan     51 year old white male status post recent revision of his left thigh AV graft with thrombectomy and stenting of the outflow. He does have wound breakdown where there is probably old graft underlying. He will need to have this wound debrided with possible revision of his graft if it is also involved. We'll get this scheduled on nondialysis day in the near future. He does not have any fevers or erythema but should he I will get him done sooner and place him on antibiotics.    Brandon C. Donzetta Matters, MD [facility] Office: [phone] Pager: [phone]

## 2016-02-21 ENCOUNTER — Other Ambulatory Visit: Payer: Self-pay

## 2016-02-25 ENCOUNTER — Encounter (HOSPITAL_COMMUNITY): Payer: Self-pay | Admitting: *Deleted

## 2016-02-25 NOTE — Progress Notes (Signed)
Pt denies SOB, chest pain, and being under the care of a cardiologist. Pt denies having a stress test and cardiac cath. Pt denies having an EKG within the last year. Pt made aware to stop taking otc vitamins, fish oil and herbal medications. Do not take any NSAIDs ie: Ibuprofen, Advil, Naproxen BC and Goody Powder. Pt stated that he takes his medications at 2:00 P.M. Pt verbalized understanding of all pre-op instructions.

## 2016-02-29 ENCOUNTER — Ambulatory Visit (HOSPITAL_COMMUNITY): Payer: Medicare Other | Admitting: Certified Registered Nurse Anesthetist

## 2016-02-29 ENCOUNTER — Encounter (HOSPITAL_COMMUNITY): Payer: Self-pay | Admitting: *Deleted

## 2016-02-29 ENCOUNTER — Inpatient Hospital Stay (HOSPITAL_COMMUNITY)
Admission: RE | Admit: 2016-02-29 | Discharge: 2016-03-03 | DRG: 901 | Disposition: A | Discharge status: Home Health | Payer: Medicare Other | Source: Ambulatory Visit | Attending: Vascular Surgery | Admitting: Vascular Surgery

## 2016-02-29 ENCOUNTER — Encounter (HOSPITAL_COMMUNITY)
Admission: RE | Disposition: A | Discharge status: Home Health | Payer: Self-pay | Source: Ambulatory Visit | Attending: Vascular Surgery

## 2016-02-29 DIAGNOSIS — I9589 Other hypotension: Secondary | ICD-10-CM | POA: Diagnosis present

## 2016-02-29 DIAGNOSIS — N185 Chronic kidney disease, stage 5: Secondary | ICD-10-CM | POA: Diagnosis not present

## 2016-02-29 DIAGNOSIS — S0083XA Contusion of other part of head, initial encounter: Secondary | ICD-10-CM | POA: Diagnosis not present

## 2016-02-29 DIAGNOSIS — T8131XA Disruption of external operation (surgical) wound, not elsewhere classified, initial encounter: Secondary | ICD-10-CM | POA: Diagnosis present

## 2016-02-29 DIAGNOSIS — I120 Hypertensive chronic kidney disease with stage 5 chronic kidney disease or end stage renal disease: Secondary | ICD-10-CM | POA: Diagnosis present

## 2016-02-29 DIAGNOSIS — T8189XA Other complications of procedures, not elsewhere classified, initial encounter: Secondary | ICD-10-CM | POA: Diagnosis present

## 2016-02-29 DIAGNOSIS — Z992 Dependence on renal dialysis: Secondary | ICD-10-CM

## 2016-02-29 DIAGNOSIS — M898X9 Other specified disorders of bone, unspecified site: Secondary | ICD-10-CM | POA: Diagnosis present

## 2016-02-29 DIAGNOSIS — W1812XA Fall from or off toilet with subsequent striking against object, initial encounter: Secondary | ICD-10-CM | POA: Diagnosis not present

## 2016-02-29 DIAGNOSIS — D649 Anemia, unspecified: Secondary | ICD-10-CM | POA: Diagnosis present

## 2016-02-29 DIAGNOSIS — Y92231 Patient bathroom in hospital as the place of occurrence of the external cause: Secondary | ICD-10-CM | POA: Diagnosis not present

## 2016-02-29 DIAGNOSIS — E039 Hypothyroidism, unspecified: Secondary | ICD-10-CM | POA: Diagnosis present

## 2016-02-29 DIAGNOSIS — Q059 Spina bifida, unspecified: Secondary | ICD-10-CM

## 2016-02-29 DIAGNOSIS — K219 Gastro-esophageal reflux disease without esophagitis: Secondary | ICD-10-CM | POA: Diagnosis present

## 2016-02-29 DIAGNOSIS — E875 Hyperkalemia: Secondary | ICD-10-CM | POA: Diagnosis not present

## 2016-02-29 DIAGNOSIS — Z4801 Encounter for change or removal of surgical wound dressing: Secondary | ICD-10-CM | POA: Diagnosis present

## 2016-02-29 DIAGNOSIS — N186 End stage renal disease: Secondary | ICD-10-CM | POA: Diagnosis present

## 2016-02-29 DIAGNOSIS — Z6838 Body mass index (BMI) 38.0-38.9, adult: Secondary | ICD-10-CM | POA: Diagnosis not present

## 2016-02-29 DIAGNOSIS — N2581 Secondary hyperparathyroidism of renal origin: Secondary | ICD-10-CM | POA: Diagnosis present

## 2016-02-29 DIAGNOSIS — Z7982 Long term (current) use of aspirin: Secondary | ICD-10-CM | POA: Diagnosis not present

## 2016-02-29 HISTORY — PX: I & D EXTREMITY: SHX5045

## 2016-02-29 HISTORY — DX: Personal history of other medical treatment: Z92.89

## 2016-02-29 HISTORY — PX: REVISION OF ARTERIOVENOUS GORETEX GRAFT: SHX6073

## 2016-02-29 HISTORY — DX: Personal history of urinary calculi: Z87.442

## 2016-02-29 HISTORY — DX: Pneumonia, unspecified organism: J18.9

## 2016-02-29 HISTORY — DX: Other complications of procedures, not elsewhere classified, initial encounter: T81.89XA

## 2016-02-29 HISTORY — DX: Migraine, unspecified, not intractable, without status migrainosus: G43.909

## 2016-02-29 HISTORY — PX: WOUND DEBRIDEMENT: SHX247

## 2016-02-29 HISTORY — DX: Other generalized epilepsy and epileptic syndromes, not intractable, without status epilepticus: G40.409

## 2016-02-29 LAB — CREATININE, SERUM
Creatinine, Ser: 9.3 mg/dL — ABNORMAL HIGH (ref 0.61–1.24)
GFR calc Af Amer: 7 mL/min — ABNORMAL LOW (ref >60)
GFR calc non Af Amer: 6 mL/min — ABNORMAL LOW (ref >60)

## 2016-02-29 LAB — CBC
HCT: 37.1 % — ABNORMAL LOW (ref 39.0–52.0)
Hemoglobin: 11 g/dL — ABNORMAL LOW (ref 13.0–17.0)
MCH: 28.1 pg (ref 26.0–34.0)
MCHC: 29.6 g/dL — ABNORMAL LOW (ref 30.0–36.0)
MCV: 94.6 fL (ref 78.0–100.0)
Platelets: 156 10*3/uL (ref 150–400)
RBC: 3.92 MIL/uL — ABNORMAL LOW (ref 4.22–5.81)
RDW: 18.5 % — ABNORMAL HIGH (ref 11.5–15.5)
WBC: 6.7 10*3/uL (ref 4.0–10.5)

## 2016-02-29 LAB — POCT I-STAT 4, (NA,K, GLUC, HGB,HCT)
Glucose, Bld: 97 mg/dL (ref 65–99)
HCT: 39 % (ref 39.0–52.0)
Hemoglobin: 13.3 g/dL (ref 13.0–17.0)
Potassium: 5.1 mmol/L (ref 3.5–5.1)
Sodium: 133 mmol/L — ABNORMAL LOW (ref 135–145)

## 2016-02-29 LAB — MRSA PCR SCREENING: MRSA by PCR: NEGATIVE

## 2016-02-29 SURGERY — IRRIGATION AND DEBRIDEMENT EXTREMITY
Anesthesia: Choice | Site: Thigh | Laterality: Left

## 2016-02-29 MED ORDER — SODIUM CHLORIDE 0.9 % IV SOLN
INTRAVENOUS | Status: DC
  Administered 2016-02-29 (×2): via INTRAVENOUS

## 2016-02-29 MED ORDER — SODIUM CHLORIDE 0.9% FLUSH
3.0000 mL | Freq: Two times a day (BID) | INTRAVENOUS | Status: DC
  Administered 2016-02-29 – 2016-03-02 (×4): 3 mL via INTRAVENOUS

## 2016-02-29 MED ORDER — SODIUM POLYSTYRENE SULFONATE 15 GM/60ML PO SUSP: 15.0000 g | ORAL | Status: DC

## 2016-02-29 MED ORDER — BISACODYL 10 MG RE SUPP: 10.0000 mg | Freq: Every day | RECTAL | Status: DC | PRN

## 2016-02-29 MED ORDER — ONDANSETRON HCL 4 MG/2ML IJ SOLN
INTRAMUSCULAR | Status: DC | PRN
  Administered 2016-02-29: 4 mg via INTRAVENOUS

## 2016-02-29 MED ORDER — CLOPIDOGREL BISULFATE 75 MG PO TABS
75.0000 mg | ORAL_TABLET | Freq: Every day | ORAL | Status: DC
  Administered 2016-03-01 – 2016-03-03 (×3): 75 mg via ORAL
  Filled 2016-02-29 (×3): qty 1

## 2016-02-29 MED ORDER — ASPIRIN 325 MG PO TABS
325.0000 mg | ORAL_TABLET | Freq: Every day | ORAL | Status: DC
  Administered 2016-03-01 – 2016-03-02 (×2): 325 mg via ORAL
  Filled 2016-02-29 (×3): qty 1

## 2016-02-29 MED ORDER — PROPOFOL 10 MG/ML IV BOLUS
INTRAVENOUS | Status: AC
  Filled 2016-02-29: qty 20

## 2016-02-29 MED ORDER — CALCIUM ACETATE (PHOS BINDER) 667 MG PO CAPS
2001.0000 mg | ORAL_CAPSULE | Freq: Three times a day (TID) | ORAL | Status: DC
  Administered 2016-02-29 – 2016-03-03 (×6): 2001 mg via ORAL
  Filled 2016-02-29 (×6): qty 3

## 2016-02-29 MED ORDER — ALBUMIN HUMAN 5 % IV SOLN
INTRAVENOUS | Status: AC
  Administered 2016-02-29: 12.5 g
  Filled 2016-02-29: qty 250

## 2016-02-29 MED ORDER — MIDAZOLAM HCL 2 MG/2ML IJ SOLN
INTRAMUSCULAR | Status: AC
  Filled 2016-02-29: qty 2

## 2016-02-29 MED ORDER — 0.9 % SODIUM CHLORIDE (POUR BTL) OPTIME
TOPICAL | Status: DC | PRN
  Administered 2016-02-29: 1000 mL

## 2016-02-29 MED ORDER — DEXTROSE 5 % IV SOLN
1.5000 g | INTRAVENOUS | Status: AC
  Administered 2016-03-01: 1.5 g via INTRAVENOUS
  Filled 2016-02-29: qty 1.5

## 2016-02-29 MED ORDER — PROMETHAZINE HCL 25 MG PO TABS
25.0000 mg | ORAL_TABLET | Freq: Three times a day (TID) | ORAL | Status: DC
  Administered 2016-02-29 – 2016-03-03 (×8): 25 mg via ORAL
  Filled 2016-02-29 (×8): qty 1

## 2016-02-29 MED ORDER — OXYCODONE-ACETAMINOPHEN 5-325 MG PO TABS
1.0000 | ORAL_TABLET | ORAL | Status: DC | PRN
  Administered 2016-02-29 – 2016-03-03 (×9): 1 via ORAL
  Filled 2016-02-29 (×9): qty 1

## 2016-02-29 MED ORDER — GUAIFENESIN-DM 100-10 MG/5ML PO SYRP: 15.0000 mL | ORAL_SOLUTION | ORAL | Status: DC | PRN

## 2016-02-29 MED ORDER — DOCUSATE SODIUM 100 MG PO CAPS
100.0000 mg | ORAL_CAPSULE | Freq: Every day | ORAL | Status: DC
  Administered 2016-02-29 – 2016-03-03 (×4): 100 mg via ORAL
  Filled 2016-02-29 (×4): qty 1

## 2016-02-29 MED ORDER — AZELASTINE HCL 0.1 % NA SOLN: 1.0000 | Freq: Two times a day (BID) | NASAL | Status: DC | PRN

## 2016-02-29 MED ORDER — ZOLPIDEM TARTRATE 5 MG PO TABS
10.0000 mg | ORAL_TABLET | Freq: Every day | ORAL | Status: DC
  Administered 2016-02-29 – 2016-03-02 (×3): 10 mg via ORAL
  Filled 2016-02-29 (×3): qty 2

## 2016-02-29 MED ORDER — SODIUM CHLORIDE 0.9 % IV SOLN: 100.0000 mL | INTRAVENOUS | Status: DC | PRN

## 2016-02-29 MED ORDER — LORAZEPAM 0.5 MG PO TABS
0.5000 mg | ORAL_TABLET | Freq: Every day | ORAL | Status: DC
  Administered 2016-02-29 – 2016-03-02 (×3): 0.5 mg via ORAL
  Filled 2016-02-29 (×3): qty 1

## 2016-02-29 MED ORDER — PHENYLEPHRINE HCL 10 MG/ML IJ SOLN
INTRAMUSCULAR | Status: DC | PRN
  Administered 2016-02-29 (×2): 120 ug via INTRAVENOUS
  Administered 2016-02-29: 160 ug via INTRAVENOUS

## 2016-02-29 MED ORDER — LIDOCAINE-PRILOCAINE 2.5-2.5 % EX CREA
1.0000 "application " | TOPICAL_CREAM | CUTANEOUS | Status: DC | PRN
  Filled 2016-02-29: qty 5

## 2016-02-29 MED ORDER — MEPERIDINE HCL 25 MG/ML IJ SOLN: 6.2500 mg | INTRAMUSCULAR | Status: DC | PRN

## 2016-02-29 MED ORDER — FUROSEMIDE 80 MG PO TABS: 80.0000 mg | ORAL_TABLET | Freq: Every day | ORAL | Status: DC | PRN

## 2016-02-29 MED ORDER — METOPROLOL TARTRATE 5 MG/5ML IV SOLN: 2.0000 mg | INTRAVENOUS | Status: DC | PRN

## 2016-02-29 MED ORDER — DEXTROSE 5 % IV SOLN
1.5000 g | INTRAVENOUS | Status: AC
  Administered 2016-02-29: 1.5 g via INTRAVENOUS

## 2016-02-29 MED ORDER — POTASSIUM CHLORIDE CRYS ER 20 MEQ PO TBCR: 20.0000 meq | EXTENDED_RELEASE_TABLET | Freq: Once | ORAL | Status: DC

## 2016-02-29 MED ORDER — SODIUM CHLORIDE 0.9% FLUSH: 3.0000 mL | INTRAVENOUS | Status: DC | PRN

## 2016-02-29 MED ORDER — PROMETHAZINE HCL 25 MG/ML IJ SOLN: 6.2500 mg | INTRAMUSCULAR | Status: DC | PRN

## 2016-02-29 MED ORDER — SODIUM POLYSTYRENE SULFONATE PO POWD: 15.0000 g | ORAL | Status: DC

## 2016-02-29 MED ORDER — GLYCOPYRROLATE 0.2 MG/ML IJ SOLN
INTRAMUSCULAR | Status: DC | PRN
  Administered 2016-02-29 (×2): .2 mg via INTRAVENOUS

## 2016-02-29 MED ORDER — LEVOTHYROXINE SODIUM 25 MCG PO TABS
125.0000 ug | ORAL_TABLET | Freq: Every day | ORAL | Status: DC
  Administered 2016-02-29 – 2016-03-03 (×4): 125 ug via ORAL
  Filled 2016-02-29 (×4): qty 1

## 2016-02-29 MED ORDER — TRAZODONE HCL 100 MG PO TABS
100.0000 mg | ORAL_TABLET | Freq: Every day | ORAL | Status: DC
  Administered 2016-02-29 – 2016-03-02 (×3): 100 mg via ORAL
  Filled 2016-02-29 (×3): qty 1

## 2016-02-29 MED ORDER — ALTEPLASE 2 MG IJ SOLR: 2.0000 mg | Freq: Once | INTRAMUSCULAR | Status: DC | PRN

## 2016-02-29 MED ORDER — MIDAZOLAM HCL 2 MG/2ML IJ SOLN: 0.5000 mg | Freq: Once | INTRAMUSCULAR | Status: DC | PRN

## 2016-02-29 MED ORDER — ALBUMIN HUMAN 5 % IV SOLN: 12.5000 g | Freq: Once | INTRAVENOUS | Status: DC

## 2016-02-29 MED ORDER — OXYCODONE-ACETAMINOPHEN 10-325 MG PO TABS: 1.0000 | ORAL_TABLET | ORAL | Status: DC | PRN

## 2016-02-29 MED ORDER — FLUTICASONE PROPIONATE 50 MCG/ACT NA SUSP: 2.0000 | Freq: Every day | NASAL | Status: DC | PRN

## 2016-02-29 MED ORDER — LIDOCAINE-EPINEPHRINE (PF) 1 %-1:200000 IJ SOLN
INTRAMUSCULAR | Status: AC
  Filled 2016-02-29: qty 30

## 2016-02-29 MED ORDER — FENTANYL CITRATE (PF) 100 MCG/2ML IJ SOLN
INTRAMUSCULAR | Status: AC
  Filled 2016-02-29: qty 4

## 2016-02-29 MED ORDER — HEPARIN SODIUM (PORCINE) 5000 UNIT/ML IJ SOLN
5000.0000 [IU] | Freq: Three times a day (TID) | INTRAMUSCULAR | Status: DC
  Administered 2016-02-29 – 2016-03-03 (×10): 5000 [IU] via SUBCUTANEOUS
  Filled 2016-02-29 (×10): qty 1

## 2016-02-29 MED ORDER — LIDOCAINE 2% (20 MG/ML) 5 ML SYRINGE
INTRAMUSCULAR | Status: DC | PRN
  Administered 2016-02-29: 40 mg via INTRAVENOUS

## 2016-02-29 MED ORDER — MORPHINE SULFATE (PF) 2 MG/ML IV SOLN
2.0000 mg | INTRAVENOUS | Status: DC | PRN
  Administered 2016-02-29: 2 mg via INTRAVENOUS
  Filled 2016-02-29: qty 1

## 2016-02-29 MED ORDER — ONDANSETRON HCL 4 MG/2ML IJ SOLN: 4.0000 mg | Freq: Four times a day (QID) | INTRAMUSCULAR | Status: DC | PRN

## 2016-02-29 MED ORDER — FENTANYL CITRATE (PF) 100 MCG/2ML IJ SOLN
INTRAMUSCULAR | Status: AC
  Filled 2016-02-29: qty 2

## 2016-02-29 MED ORDER — FENTANYL CITRATE (PF) 100 MCG/2ML IJ SOLN
INTRAMUSCULAR | Status: DC | PRN
  Administered 2016-02-29 (×6): 50 ug via INTRAVENOUS

## 2016-02-29 MED ORDER — OXYCODONE HCL 5 MG PO TABS
5.0000 mg | ORAL_TABLET | ORAL | Status: DC | PRN
  Administered 2016-02-29 – 2016-03-03 (×9): 5 mg via ORAL
  Filled 2016-02-29 (×9): qty 1

## 2016-02-29 MED ORDER — HEPARIN SODIUM (PORCINE) 1000 UNIT/ML DIALYSIS: 2000.0000 [IU] | INTRAMUSCULAR | Status: DC | PRN

## 2016-02-29 MED ORDER — PENTAFLUOROPROP-TETRAFLUOROETH EX AERO: 1.0000 "application " | INHALATION_SPRAY | CUTANEOUS | Status: DC | PRN

## 2016-02-29 MED ORDER — RENA-VITE PO TABS
1.0000 | ORAL_TABLET | Freq: Every day | ORAL | Status: DC
  Administered 2016-02-29 – 2016-03-02 (×3): 1 via ORAL
  Filled 2016-02-29 (×3): qty 1

## 2016-02-29 MED ORDER — MIDODRINE HCL 5 MG PO TABS
5.0000 mg | ORAL_TABLET | Freq: Three times a day (TID) | ORAL | Status: DC
Start: 2016-02-29 — End: 2016-03-03
  Administered 2016-02-29 – 2016-03-03 (×8): 5 mg via ORAL
  Filled 2016-02-29 (×8): qty 1

## 2016-02-29 MED ORDER — PHENYLEPHRINE HCL 10 MG/ML IJ SOLN
INTRAVENOUS | Status: DC | PRN
  Administered 2016-02-29: 50 ug/min via INTRAVENOUS

## 2016-02-29 MED ORDER — CHLORHEXIDINE GLUCONATE CLOTH 2 % EX PADS: 6.0000 | MEDICATED_PAD | Freq: Once | CUTANEOUS | Status: DC

## 2016-02-29 MED ORDER — SODIUM CHLORIDE 0.9 % IV SOLN: 250.0000 mL | INTRAVENOUS | Status: DC | PRN

## 2016-02-29 MED ORDER — PROPOFOL 10 MG/ML IV BOLUS
INTRAVENOUS | Status: DC | PRN
  Administered 2016-02-29: 50 mg via INTRAVENOUS
  Administered 2016-02-29: 150 mg via INTRAVENOUS

## 2016-02-29 MED ORDER — HEPARIN SODIUM (PORCINE) 1000 UNIT/ML DIALYSIS: 1000.0000 [IU] | INTRAMUSCULAR | Status: DC | PRN

## 2016-02-29 MED ORDER — ACETAMINOPHEN 325 MG PO TABS
325.0000 mg | ORAL_TABLET | ORAL | Status: DC | PRN
  Administered 2016-03-02: 650 mg via ORAL
  Filled 2016-02-29: qty 2

## 2016-02-29 MED ORDER — LABETALOL HCL 5 MG/ML IV SOLN: 10.0000 mg | INTRAVENOUS | Status: DC | PRN

## 2016-02-29 MED ORDER — FAMOTIDINE 20 MG PO TABS
40.0000 mg | ORAL_TABLET | ORAL | Status: DC
Start: 2016-02-29 — End: 2016-03-03
  Administered 2016-02-29 – 2016-03-03 (×4): 40 mg via ORAL
  Filled 2016-02-29 (×4): qty 2

## 2016-02-29 MED ORDER — DEXTROSE 5 % IV SOLN
INTRAVENOUS | Status: AC
  Filled 2016-02-29: qty 1.5

## 2016-02-29 MED ORDER — FENTANYL CITRATE (PF) 100 MCG/2ML IJ SOLN
25.0000 ug | INTRAMUSCULAR | Status: DC | PRN
  Administered 2016-02-29 (×2): 50 ug via INTRAVENOUS

## 2016-02-29 MED ORDER — ACETAMINOPHEN 325 MG RE SUPP: 325.0000 mg | RECTAL | Status: DC | PRN

## 2016-02-29 MED ORDER — ALBUMIN HUMAN 5 % IV SOLN
INTRAVENOUS | Status: DC | PRN
  Administered 2016-02-29: 09:00:00 via INTRAVENOUS

## 2016-02-29 MED ORDER — HYDRALAZINE HCL 20 MG/ML IJ SOLN: 5.0000 mg | INTRAMUSCULAR | Status: DC | PRN

## 2016-02-29 MED ORDER — PHENOL 1.4 % MT LIQD: 1.0000 | OROMUCOSAL | Status: DC | PRN

## 2016-02-29 MED ORDER — PANTOPRAZOLE SODIUM 40 MG PO TBEC
40.0000 mg | DELAYED_RELEASE_TABLET | Freq: Every day | ORAL | Status: DC
  Administered 2016-03-02 – 2016-03-03 (×2): 40 mg via ORAL
  Filled 2016-02-29 (×2): qty 1

## 2016-02-29 MED ORDER — MIDAZOLAM HCL 5 MG/5ML IJ SOLN
INTRAMUSCULAR | Status: DC | PRN
  Administered 2016-02-29: 2 mg via INTRAVENOUS

## 2016-02-29 MED ORDER — CALCIUM ACETATE (PHOS BINDER) 667 MG PO CAPS: 1334.0000 mg | ORAL_CAPSULE | ORAL | Status: DC | PRN

## 2016-02-29 MED ORDER — LIDOCAINE HCL (PF) 1 % IJ SOLN: 5.0000 mL | INTRAMUSCULAR | Status: DC | PRN

## 2016-02-29 MED ORDER — ALUM & MAG HYDROXIDE-SIMETH 200-200-20 MG/5ML PO SUSP: 15.0000 mL | ORAL | Status: DC | PRN

## 2016-02-29 SURGICAL SUPPLY — 45 items
ARMBAND PINK RESTRICT EXTREMIT (MISCELLANEOUS) IMPLANT
BANDAGE ACE 4X5 VEL STRL LF (GAUZE/BANDAGES/DRESSINGS) IMPLANT
BANDAGE ACE 6X5 VEL STRL LF (GAUZE/BANDAGES/DRESSINGS) IMPLANT
BNDG GAUZE ELAST 4 BULKY (GAUZE/BANDAGES/DRESSINGS) IMPLANT
CANISTER SUCT 3000ML PPV (MISCELLANEOUS) ×2 IMPLANT
CLIP TI MEDIUM 6 (CLIP) ×2 IMPLANT
CLIP TI WIDE RED SMALL 6 (CLIP) ×2 IMPLANT
COVER SURGICAL LIGHT HANDLE (MISCELLANEOUS) ×2 IMPLANT
DERMABOND ADVANCED (GAUZE/BANDAGES/DRESSINGS)
DERMABOND ADVANCED .7 DNX12 (GAUZE/BANDAGES/DRESSINGS) IMPLANT
DRAPE INCISE IOBAN 66X45 STRL (DRAPES) ×2 IMPLANT
DRAPE ORTHO SPLIT 77X108 STRL (DRAPES)
DRAPE PROXIMA HALF (DRAPES) IMPLANT
DRAPE SURG ORHT 6 SPLT 77X108 (DRAPES) IMPLANT
DRSG ADAPTIC 3X8 NADH LF (GAUZE/BANDAGES/DRESSINGS) IMPLANT
DRSG VAC ATS SM SENSATRAC (GAUZE/BANDAGES/DRESSINGS) ×2 IMPLANT
ELECT REM PT RETURN 9FT ADLT (ELECTROSURGICAL) ×2
ELECTRODE REM PT RTRN 9FT ADLT (ELECTROSURGICAL) ×1 IMPLANT
GLOVE BIO SURGEON STRL SZ7.5 (GLOVE) ×2 IMPLANT
GOWN STRL REUS W/ TWL LRG LVL3 (GOWN DISPOSABLE) ×2 IMPLANT
GOWN STRL REUS W/ TWL XL LVL3 (GOWN DISPOSABLE) ×1 IMPLANT
GOWN STRL REUS W/TWL LRG LVL3 (GOWN DISPOSABLE) ×2
GOWN STRL REUS W/TWL XL LVL3 (GOWN DISPOSABLE) ×1
INSERT FOGARTY SM (MISCELLANEOUS) IMPLANT
KIT BASIN OR (CUSTOM PROCEDURE TRAY) ×2 IMPLANT
KIT ROOM TURNOVER OR (KITS) ×2 IMPLANT
NS IRRIG 1000ML POUR BTL (IV SOLUTION) ×2 IMPLANT
PACK CV ACCESS (CUSTOM PROCEDURE TRAY) ×2 IMPLANT
PACK GENERAL/GYN (CUSTOM PROCEDURE TRAY) IMPLANT
PAD ARMBOARD 7.5X6 YLW CONV (MISCELLANEOUS) ×4 IMPLANT
SPONGE GAUZE 4X4 12PLY STER LF (GAUZE/BANDAGES/DRESSINGS) IMPLANT
SUT ETHILON 3 0 PS 1 (SUTURE) IMPLANT
SUT GORETEX 6.0 TH-9 30 IN (SUTURE) IMPLANT
SUT GORETEX CV-6TTC-13 36IN (SUTURE) IMPLANT
SUT MNCRL AB 4-0 PS2 18 (SUTURE) IMPLANT
SUT PROLENE 6 0 BV (SUTURE) IMPLANT
SUT VIC AB 2-0 CTB1 (SUTURE) IMPLANT
SUT VIC AB 3-0 SH 27 (SUTURE) ×2
SUT VIC AB 3-0 SH 27X BRD (SUTURE) ×2 IMPLANT
SUT VICRYL 4-0 PS2 18IN ABS (SUTURE) IMPLANT
TOWEL OR 17X24 6PK STRL BLUE (TOWEL DISPOSABLE) IMPLANT
TOWEL OR 17X26 10 PK STRL BLUE (TOWEL DISPOSABLE) IMPLANT
UNDERPAD 30X30 (UNDERPADS AND DIAPERS) ×2 IMPLANT
WATER STERILE IRR 1000ML POUR (IV SOLUTION) ×2 IMPLANT
WND VAC CANISTER 500ML (MISCELLANEOUS) ×2 IMPLANT

## 2016-02-29 NOTE — Anesthesia Postprocedure Evaluation (Addendum)
Anesthesia Post Note  Patient: [name]  Procedure(s) Performed: Procedure(s) (LRB): DEBRIDEMENT LEFT THIGH WOUND (Left) POSSIBLE REVISION OF LEFT THIGH ARTERIOVENOUS GORETEX GRAFT (Left)  Patient location during evaluation: PACU Anesthesia Type: General Level of consciousness: awake and alert, oriented and patient cooperative Pain management: pain level controlled Vital Signs Assessment: post-procedure vital signs reviewed and stable Respiratory status: spontaneous breathing, nonlabored ventilation and respiratory function stable Cardiovascular status: blood pressure returned to baseline and stable Postop Assessment: no signs of nausea or vomiting Anesthetic complications: no       Last Vitals:  Vitals:   02/29/16 1239 02/29/16 1321  BP:  (!) 92/49  Pulse:  67  Resp:  20  Temp: 36.1 C 36.6 C    Last Pain:  Vitals:   02/29/16 1321  TempSrc: Oral  PainSc: 9                  Koral Thaden,E. Evie Croston

## 2016-02-29 NOTE — Consult Note (Signed)
[facility] Renal Consultation Note    Indication for Consultation:  Management of ESRD/hemodialysis, anemia, hypertension/volume, and secondary hyperparathyroidism. PCP:  HPI: [name] is a 51 y.o. male with ESRD (on HD x 20 years), chronic hypotension on midodrine, hypothyroidism, GERD, and spina bifida who was admitted for elective L thigh AVG wound debridement and wound vac placement. S/p revision of the L thigh AVG (thrombectomy with iliac stenting, pseudoaneurysm replaced with new jump AVG) on 01/14/16, and developed a non-healing wound with areas of necrotic skin breakdown. There was concern for old pieces of old underlying AVG per last outpt VVS note.  Dialyzes MWF at [facility], last HD 2/26. No recent dialysis issues, currently using R femoral TDC. Currently denies CP, dyspnea, fever, chills, N/V or diarrhea.  Past Medical History:  Diagnosis Date  . Anemia   . Anxiety   . End stage renal failure on dialysis ([place])   . GERD (gastroesophageal reflux disease)   . Headache(784.0)    migraines  . Hypertension    hx of - has not taken bp meds in over 2 years  . Hypothyroidism   . Insomnia   . Kidney stones   . Nonhealing surgical wound    of the left arteriovenous graft  . Pneumonia   . Seizures ([place])    last 1998  . Shortness of breath    rarely  . Spina bifida [facility])    Past Surgical History:  Procedure Laterality Date  . APPENDECTOMY    . ARTERIOVENOUS GRAFT PLACEMENT Left 10/16/2012   left femoral goretex graft         Dr Donnetta Hutching  . AV FISTULA PLACEMENT Left 06/27/2012   Procedure: EXPLORATORY LEFT THY-GRAFT PSEUDO-ANEURYSM;  Surgeon: Conrad Woodmere, MD;  Location: [place];  Service: Vascular;  Laterality: Left;  Revision of left Arteriovenus gortex graft in thigh.  . COLONOSCOPY    . INCISION AND DRAINAGE Left 10/16/2012   Procedure: INCISION AND Debridement left thigh graft;  Surgeon: Rosetta Posner, MD;  Location: [place];  Service: Vascular;   Laterality: Left;  . INSERTION OF DIALYSIS CATHETER    . INSERTION OF DIALYSIS CATHETER  01/14/2016   Procedure: INSERTION OF DIALYSIS CATHETER;  Surgeon: Waynetta Sandy, MD;  Location: [place];  Service: Vascular;;  . INSERTION OF ILIAC STENT Left 01/14/2016   Procedure: INSERTION OF ILIAC STENT;  Surgeon: Waynetta Sandy, MD;  Location: [place];  Service: Vascular;  Laterality: Left;  . INTRAOPERATIVE ARTERIOGRAM Left 01/14/2016   Procedure: INTRA OPERATIVE ARTERIOGRAM;  Surgeon: Waynetta Sandy, MD;  Location: [place];  Service: Vascular;  Laterality: Left;  . KNEE SURGERY Right    patella and tendon  . LITHOTRIPSY     x 3  . PERIPHERAL VASCULAR CATHETERIZATION  01/14/2016   Procedure: A/V SHUNTOGRAM;  Surgeon: Waynetta Sandy, MD;  Location: [place];  Service: Vascular;;  . REVISION OF ARTERIOVENOUS GORETEX GRAFT Left 10/16/2012   Procedure: REVISION OF LEFT FEMORAL LOOP ARTERIOVENOUS GORETEX GRAFT;  Surgeon: Rosetta Posner, MD;  Location: [place];  Service: Vascular;  Laterality: Left;  . REVISION OF ARTERIOVENOUS GORETEX GRAFT Left 02/11/2015   Procedure: EXCISION OF SMALL SEGMENT OF EXPOSED LEFT THIGH NON FUNCTIONING  ARTERIOVENOUS GORETEX GRAFT;  Surgeon: Mal Misty, MD;  Location: [place];  Service: Vascular;  Laterality: Left;  . REVISION OF ARTERIOVENOUS GORETEX GRAFT Left 01/14/2016   Procedure: REVISION OF ARTERIOVENOUS GORETEX GRAFT;  Surgeon: Waynetta Sandy, MD;  [facility];  Service: Vascular;  Laterality: Left;  . THROMBECTOMY W/ EMBOLECTOMY Left 01/14/2016   Procedure: THROMBECTOMY ARTERIOVENOUS GORE-TEX Left thigh GRAFT;  Surgeon: Waynetta Sandy, MD;  Location: [facility] OR;  Service: Vascular;  Laterality: Left;   Family History  Problem Relation Age of Onset  . Diabetes Mother   . Hypertension Mother   . Heart disease Mother     before age 55  . Diabetes Father   . Heart attack Father     X's 3  . Diabetes Sister   . Bipolar  disorder Sister    Social History:  reports that he has never smoked. He has never used smokeless tobacco. He reports that he does not drink alcohol or use drugs.  ROS: As per HPI otherwise negative.  Physical Exam: Vitals:   02/29/16 1200 02/29/16 1205 02/29/16 1212 02/29/16 1239  BP:   (!) 77/38   Pulse: 67 64 67   Resp: 10 10 19    Temp:    97 F (36.1 C)  SpO2: 100% 98% 95%   Weight:      Height:         General: Well developed, well nourished, in no acute distress. Head: Normocephalic, atraumatic, sclera non-icteric. Neck: Supple without lymphadenopathy/masses.  Lungs: Clear bilaterally to auscultation without wheezes, rales, or rhonchi. Breathing is unlabored. Heart: RRR with normal S1, S2. 2/6 systolic murmur Abdomen: Soft, non-tender, non-distended. Musculoskeletal:  Strength and tone appear normal for age. Lower extremities: Trace LLE edema. Scabbed area and wound vac present on L thigh. Neuro: Alert and oriented X 3. Moves all extremities spontaneously. Psych:  Responds to questions appropriately with a normal affect. Dialysis Access: R femoral TDC.  Allergies  Allergen Reactions  . Furadantin [Nitrofurantoin] Other (See Comments)    UNSPECIFIED REACTION   . Mandelamine [Methenamine] Other (See Comments)    UNSPECIFIED REACTION   . Noroxin [Norfloxacin] Other (See Comments)    UNSPECIFIED REACTION   . Carmine Nausea Only  . Contrast Media [Iodinated Diagnostic Agents] Nausea And Vomiting    Oral dye causes vomiting, IV dye is okay  . Hydrocodone Other (See Comments)    LIKELY NOT REACTION TO HYDROCODONE Pt states that after 3 weeks of taking this medication he will began to "twitch"  . Metrizamide Nausea And Vomiting    Oral dye causes vomiting, IV dye is okay  . Sulfa Antibiotics Cough    Childhood reaction - pt could not confirm that it was a cough  . Sulfur Cough    Childhood reaction - pt could not confirm that it was a cough   Prior to Admission  medications   Medication Sig Start Date End Date Taking? Authorizing Provider  aspirin 325 MG tablet Take 325 mg by mouth daily at 2 PM.    Yes Historical Provider, MD  azelastine (ASTELIN) 137 MCG/SPRAY nasal spray Place 1 spray into both nostrils 2 (two) times daily. Use in each nostril as directed    Yes Historical Provider, MD  calcium acetate (PHOSLO) 667 MG capsule Take 1,334-2,001 mg by mouth See admin instructions. Take 3 capsules (2001 mg) by mouth 3 times daily with meals, take 2 capsules (1334 mg) with snacks   Yes Historical Provider, MD  clopidogrel (PLAVIX) 75 MG tablet Take 1 tablet (75 mg total) by mouth daily. 01/17/16  Yes Alvia Grove, PA-C  famotidine (PEPCID) 40 MG tablet Take 40 mg by mouth daily at 2 PM.    Yes Historical Provider,  MD  fluticasone (FLONASE) 50 MCG/ACT nasal spray Place 2 sprays into both nostrils daily as needed (congestion).    Yes Historical Provider, MD  furosemide (LASIX) 80 MG tablet Take 80 mg by mouth daily as needed (swelling/ shortness of breath).    Yes Historical Provider, MD  levothyroxine (SYNTHROID, LEVOTHROID) 125 MCG tablet Take 125 mcg by mouth daily at 2 PM.    Yes Historical Provider, MD  LORazepam (ATIVAN) 0.5 MG tablet Take 0.5 mg by mouth at bedtime.    Yes Historical Provider, MD  midodrine (PROAMATINE) 5 MG tablet Take 1 tablet (5 mg total) by mouth 3 (three) times daily with meals. 01/18/16  Yes Alvia Grove, PA-C  multivitamin (RENA-VIT) TABS tablet Take 1 tablet by mouth daily.   Yes Historical Provider, MD  oxyCODONE-acetaminophen (PERCOCET) 10-325 MG tablet Take 1 tablet by mouth 4 (four) times daily as needed for pain. 01/17/16  Yes Alvia Grove, PA-C  promethazine (PHENERGAN) 25 MG tablet Take 25 mg by mouth 3 (three) times daily.    Yes Historical Provider, MD  sodium polystyrene (KAYEXALATE) powder Take 15 g by mouth See admin instructions. Take 15 g (mixed in water) by mouth on Saturdays and Sundays   Yes Historical  Provider, MD  traZODone (DESYREL) 100 MG tablet Take 100 mg by mouth at bedtime.   Yes Historical Provider, MD  zolpidem (AMBIEN) 10 MG tablet Take 10 mg by mouth at bedtime.    Yes Historical Provider, MD   Current Facility-Administered Medications  Medication Dose Route Frequency Provider Last Rate Last Dose  . 0.9 %  sodium chloride infusion  250 mL Intravenous PRN Alvia Grove, PA-C      . 0.9 %  sodium chloride infusion  100 mL Intravenous PRN Roney Jaffe, MD      . 0.9 %  sodium chloride infusion  100 mL Intravenous PRN Roney Jaffe, MD      . acetaminophen (TYLENOL) tablet 325-650 mg  325-650 mg Oral Q4H PRN Alvia Grove, PA-C       Or  . acetaminophen (TYLENOL) suppository 325-650 mg  325-650 mg Rectal Q4H PRN Alvia Grove, PA-C      . alteplase (CATHFLO ACTIVASE) injection 2 mg  2 mg Intracatheter Once PRN Roney Jaffe, MD      . alum & mag hydroxide-simeth (MAALOX/MYLANTA) 200-200-20 MG/5ML suspension 15-30 mL  15-30 mL Oral Q2H PRN Alvia Grove, PA-C      . aspirin tablet 325 mg  325 mg Oral Q1400 Alvia Grove, PA-C      . azelastine (ASTELIN) 0.1 % nasal spray 1 spray  1 spray Each Nare BID PRN Alvia Grove, PA-C      . bisacodyl (DULCOLAX) suppository 10 mg  10 mg Rectal Daily PRN Alvia Grove, PA-C      . calcium acetate (PHOSLO) capsule 1,334 mg  1,334 mg Oral PRN Waynetta Sandy, MD      . calcium acetate (PHOSLO) capsule 2,001 mg  2,001 mg Oral TID WC Alvia Grove, PA-C      . [START ON 03/01/2016] cefUROXime (ZINACEF) 1.5 g in dextrose 5 % 50 mL IVPB  1.5 g Intravenous Q24H Alvia Grove, PA-C      . [START ON 03/01/2016] clopidogrel (PLAVIX) tablet 75 mg  75 mg Oral Daily Kimberly A Trinh, PA-C      . docusate sodium (COLACE) capsule 100 mg  100 mg Oral Daily Alvia Grove,  PA-C      . famotidine (PEPCID) tablet 40 mg  40 mg Oral Q24H Kimberly A Trinh, PA-C      . fentaNYL (SUBLIMAZE) 100 MCG/2ML injection           .  fentaNYL (SUBLIMAZE) 100 MCG/2ML injection           . fluticasone (FLONASE) 50 MCG/ACT nasal spray 2 spray  2 spray Each Nare Daily PRN Alvia Grove, PA-C      . furosemide (LASIX) tablet 80 mg  80 mg Oral Daily PRN Alvia Grove, PA-C      . guaiFENesin-dextromethorphan (ROBITUSSIN DM) 100-10 MG/5ML syrup 15 mL  15 mL Oral Q4H PRN Alvia Grove, PA-C      . heparin injection 1,000 Units  1,000 Units Dialysis PRN Roney Jaffe, MD      . Derrill Memo ON 03/01/2016] heparin injection 2,000 Units  2,000 Units Dialysis PRN Roney Jaffe, MD      . heparin injection 5,000 Units  5,000 Units Subcutaneous Q8H Kimberly A Trinh, PA-C      . hydrALAZINE (APRESOLINE) injection 5 mg  5 mg Intravenous Q20 Min PRN Alvia Grove, PA-C      . labetalol (NORMODYNE,TRANDATE) injection 10 mg  10 mg Intravenous Q10 min PRN Alvia Grove, PA-C      . levothyroxine (SYNTHROID, LEVOTHROID) tablet 125 mcg  125 mcg Oral QAC breakfast Kimberly A Trinh, PA-C      . lidocaine (PF) (XYLOCAINE) 1 % injection 5 mL  5 mL Intradermal PRN Roney Jaffe, MD      . lidocaine-prilocaine (EMLA) cream 1 application  1 application Topical PRN Roney Jaffe, MD      . LORazepam (ATIVAN) tablet 0.5 mg  0.5 mg Oral QHS Alvia Grove, PA-C      . metoprolol (LOPRESSOR) injection 2-5 mg  2-5 mg Intravenous Q2H PRN Alvia Grove, PA-C      . midodrine (PROAMATINE) tablet 5 mg  5 mg Oral TID WC Alvia Grove, PA-C   5 mg at 02/29/16 1227  . morphine 2 MG/ML injection 2-5 mg  2-5 mg Intravenous Q1H PRN Alvia Grove, PA-C      . multivitamin (RENA-VIT) tablet 1 tablet  1 tablet Oral QHS Alvia Grove, PA-C      . ondansetron (ZOFRAN) injection 4 mg  4 mg Intravenous Q6H PRN Alvia Grove, PA-C      . oxyCODONE-acetaminophen (PERCOCET/ROXICET) 5-325 MG per tablet 1 tablet  1 tablet Oral Q4H PRN Waynetta Sandy, MD       And  . oxyCODONE (Oxy IR/ROXICODONE) immediate release tablet 5 mg  5 mg Oral Q4H  PRN Waynetta Sandy, MD      . pantoprazole (PROTONIX) EC tablet 40 mg  40 mg Oral Daily Alvia Grove, PA-C      . pentafluoroprop-tetrafluoroeth (GEBAUERS) aerosol 1 application  1 application Topical PRN Roney Jaffe, MD      . phenol (CHLORASEPTIC) mouth spray 1 spray  1 spray Mouth/Throat PRN Alvia Grove, PA-C      . promethazine (PHENERGAN) tablet 25 mg  25 mg Oral TID Alvia Grove, PA-C      . sodium chloride flush (NS) 0.9 % injection 3 mL  3 mL Intravenous Q12H Kimberly A Trinh, PA-C      . sodium chloride flush (NS) 0.9 % injection 3 mL  3 mL Intravenous PRN Alvia Grove, PA-C      . [  START ON 03/04/2016] sodium polystyrene (KAYEXALATE) 15 GM/60ML suspension 15 g  15 g Oral Once per day on Sun Sat Waynetta Sandy, MD      . traZODone (DESYREL) tablet 100 mg  100 mg Oral QHS Alvia Grove, PA-C      . zolpidem (AMBIEN) tablet 10 mg  10 mg Oral QHS Alvia Grove, PA-C       Labs: Basic Metabolic Panel:  Recent Labs Lab 02/29/16 0654  NA 133*  K 5.1  GLUCOSE 97   CBC:  Recent Labs Lab 02/29/16 0654  HGB 13.3  HCT 39.0   Dialysis Orders:  MWF at [facility] 4:30 hours, BFR400, DFR800, EDW 123.5kg, 1K/2.5Ca bath, TDC - Hectoral 42mcg IV q HD - Heparin 4100 bolus - Venofer 100mg  x 10 ordered (not started yet), no ESA  Assessment/Plan: 1.  L thigh AVG wound: S/p debridement and wound vac placement by vascular surgery 2/27. No abx ordered, plan per VVS. 2.  ESRD: Will continue MWF schedule, next 2/28. No heparin with next HD, usually 1K bath as outpt, will follow labs. 3.  BP/volume: Chronically low BP, on midodrine. Lower than usual s/p anesthesia, monitor. 4.  Anemia: Hgb 13.3. No ESA needed. No IV iron ordered for now, can be resumed as outpt. 5.  Metabolic bone disease: Labs pending. Continue binders/VDRA.  [name], PA-C 02/29/2016, 1:19 PM  [facility] Pager: [phone]  Pt seen, examined and  agree w A/P as above.  [name] MD [employer] pager [phone]   02/29/2016, 3:36 PM

## 2016-02-29 NOTE — Op Note (Signed)
    Patient name: [name] MRN: [id number] DOB: Sep 23, 1965 Sex: male  02/29/2016 Pre-operative Diagnosis: esrd, left thigh wound Post-operative diagnosis:  Same Surgeon:  Eda Paschal. Donzetta Matters, MD Assistant: Silva Bandy, PA Procedure Performed: debridement of left thigh wound to 5.5 x 1.5 x 1.0 cm with placement of wound vac  Indications:  51 year old male with history incision disease on dialysis currently via right groin tunneled catheter. He recently had revision of his left thigh AV graft placement of iliac venous stenting. He now has a wound in his left thigh is possibly associated with his graft will need debridement with possible revision of the graft.  Findings: The functional graft was initially traced with Doppler and noted to not be associated with the wound in the left thigh. We elected to just primarily debride the wound which had graft that was nonfunctional at one of its edges and had to be trimmed back. We also found a separate graft at the base of the wound was also removed and trimmed out of the wound. All wound was debrided to healthy tissue and wound VAC placed at completion. There was a signal in the graft that is functional at the completion of our procedure.   Procedure:  The patient was identified in the holding area and taken to the operating room where he was placed supine on the operating table and general anesthesia was induced. He was sterilely prepped and draped in the usual fashion and timeout was called. We initially used Doppler to trace our existing graft and mark out our previous incisions from our last revision. I decided that our graft was likely not incorporated in this wound and we elected to primarily debride the wound. I initially removed the existing nylon sutures with scissors. I then trimmed out the necrotic tissue with scissors back to healthy tissue superficially. In the medial aspect of the wound with identifying graft and this was traced with  electrocautery and cut as far out of the wound as possible. We identified a graft also nonfunctional and the base of the wound. This was traced out with electrocautery both laterally and medially and trimmed out of the wound as well. We select cautery to get back to healthy tissue in all aspects of our wound. We irrigated copiously. All tissue appeared viable. We used 2-0 Vicryl sutures in figure-of-eight configuration to isolate our wound from the trimmed portions of graft of which there were two medial and one lateral. We then irrigated again and placed a wound VAC to our wound that measured [phone] cm in size. This was hooked to suction with good seal. Patient did tolerate procedure well without immediate complication.    Brandon C. Donzetta Matters, MD [facility] [place] Office: [phone] Pager: [phone]

## 2016-02-29 NOTE — Care Management Note (Signed)
Case Management Note Marvetta Gibbons RN, BSN Unit 2W-Case Manager [phone]  Patient Details  Name: [name] MRN: [id number] Date of Birth: 12-06-1965  Subjective/Objective:   Pt admitted s/p debridement of left thigh wound with placement of wound VAC                 Action/Plan: PTA pt lived at home - orders placed for [facility]- spoke with pt at bedside to offer choice- per conversation pt states that he has referral from MD office for [facility] services that have already been set up with [facility] (formerly [facility])- confirmed this with Tiffany at [facility]. Pt will need home wound VAC- [facility] wound VAC form placed on shadow chart for signature- once signed will start process for home wound VAC approval- have spoken to [place] with [facility] to give "heads up" on DME need for VAC. -- [facility] will have to come to bedside prior to discharge to place home VAC on pt once VAC delivered. CM to follow for continued d/c needs.   Expected Discharge Date:                  Expected Discharge Plan:  [place]  In-House Referral:     Discharge planning Services  CM Consult  Post Acute Care Choice:  Home Health, Durable Medical Equipment Choice offered to:  Patient  DME Arranged:  Vac DME Agency:  [place] Arranged:  RN [facility] Agency:  [place]  Status of Service:  In process, will continue to follow  If discussed at Long Length of Stay Meetings, dates discussed:    Discharge Disposition: home with home health   Additional Comments:  Darrell Patricia, RN 02/29/2016, 2:34 PM

## 2016-02-29 NOTE — Anesthesia Procedure Notes (Signed)
Procedure Name: LMA Insertion Date/Time: 02/29/2016 9:06 AM Performed by: Salli Quarry Shalita Notte Pre-anesthesia Checklist: Patient identified, Emergency Drugs available, Suction available and Patient being monitored Patient Re-evaluated:Patient Re-evaluated prior to inductionOxygen Delivery Method: Circle System Utilized Preoxygenation: Pre-oxygenation with 100% oxygen Intubation Type: IV induction LMA: LMA inserted LMA Size: 5.0 Number of attempts: 1 Placement Confirmation: positive ETCO2 Tube secured with: Tape Dental Injury: Teeth and Oropharynx as per pre-operative assessment

## 2016-02-29 NOTE — H&P (Signed)
     History and Physical Update  The patient was interviewed and re-examined.  The patient's previous History and Physical has been reviewed and is unchanged from office visit.   Nashton Belson C. Donzetta Matters, MD [facility] [place] Office: [phone] Pager: [phone]  02/29/2016, 7:42 AM

## 2016-02-29 NOTE — Transfer of Care (Signed)
Immediate Anesthesia Transfer of Care Note  Patient: [name]  Procedure(s) Performed: Procedure(s): DEBRIDEMENT LEFT THIGH WOUND (Left) POSSIBLE REVISION OF LEFT THIGH ARTERIOVENOUS GORETEX GRAFT (Left)  Patient Location: PACU  Anesthesia Type:General  Level of Consciousness: awake, alert  and patient cooperative  Airway & Oxygen Therapy: Patient Spontanous Breathing  Post-op Assessment: Report given to RN and Post -op Vital signs reviewed and stable  Post vital signs: Reviewed and stable  Last Vitals:  Vitals:   02/29/16 0719 02/29/16 1056  BP: (!) 84/39   Pulse: 72   Resp: 18   Temp: 37.2 C (P) 36.9 C    Last Pain: There were no vitals filed for this visit.    Patients Stated Pain Goal: 8 (93/71/69 6789)  Complications: No apparent anesthesia complications

## 2016-02-29 NOTE — Anesthesia Preprocedure Evaluation (Addendum)
Anesthesia Evaluation  Patient identified by MRN, date of birth, ID band Patient awake    Reviewed: Allergy & Precautions, NPO status , Patient's Chart, lab work & pertinent test results  Airway Mallampati: III  TM Distance: >3 FB Neck ROM: Full    Dental  (+) Dental Advisory Given   Pulmonary shortness of breath,    breath sounds clear to auscultation       Cardiovascular hypertension, (-) angina+ Peripheral Vascular Disease   Rhythm:Regular Rate:Normal  '10 ECHO: low normal LVF, valves OK   Neuro/Psych  Headaches, Anxiety Spina bifida    GI/Hepatic Neg liver ROS, GERD  Medicated and Controlled,  Endo/Other  Hypothyroidism Morbid obesity  Renal/GU ESRF and DialysisRenal disease (K+ 5.1)     Musculoskeletal   Abdominal (+) + obese,   Peds  Hematology negative hematology ROS (+)   Anesthesia Other Findings   Reproductive/Obstetrics                            Anesthesia Physical Anesthesia Plan  ASA: III  Anesthesia Plan: General   Post-op Pain Management:    Induction: Intravenous  Airway Management Planned: LMA  Additional Equipment:   Intra-op Plan:   Post-operative Plan:   Informed Consent: I have reviewed the patients History and Physical, chart, labs and discussed the procedure including the risks, benefits and alternatives for the proposed anesthesia with the patient or authorized representative who has indicated his/her understanding and acceptance.   Dental advisory given  Plan Discussed with: CRNA and Surgeon  Anesthesia Plan Comments: (Plan routine monitors, GA- LMA OK)       Anesthesia Quick Evaluation

## 2016-03-01 ENCOUNTER — Encounter (HOSPITAL_COMMUNITY): Payer: Self-pay | Admitting: Vascular Surgery

## 2016-03-01 LAB — COMPREHENSIVE METABOLIC PANEL
ALT: 9 U/L — ABNORMAL LOW (ref 17–63)
AST: 12 U/L — ABNORMAL LOW (ref 15–41)
Albumin: 3.5 g/dL (ref 3.5–5.0)
Alkaline Phosphatase: 53 U/L (ref 38–126)
Anion gap: 14 (ref 5–15)
BUN: 48 mg/dL — ABNORMAL HIGH (ref 6–20)
CO2: 22 mmol/L (ref 22–32)
Calcium: 9.2 mg/dL (ref 8.9–10.3)
Chloride: 97 mmol/L — ABNORMAL LOW (ref 101–111)
Creatinine, Ser: 10.34 mg/dL — ABNORMAL HIGH (ref 0.61–1.24)
GFR calc Af Amer: 6 mL/min — ABNORMAL LOW (ref >60)
GFR calc non Af Amer: 5 mL/min — ABNORMAL LOW (ref >60)
Glucose, Bld: 100 mg/dL — ABNORMAL HIGH (ref 65–99)
Potassium: 5.8 mmol/L — ABNORMAL HIGH (ref 3.5–5.1)
Sodium: 133 mmol/L — ABNORMAL LOW (ref 135–145)
Total Bilirubin: 0.4 mg/dL (ref 0.3–1.2)
Total Protein: 7.5 g/dL (ref 6.5–8.1)

## 2016-03-01 LAB — CBC
HCT: 35.3 % — ABNORMAL LOW (ref 39.0–52.0)
Hemoglobin: 10.5 g/dL — ABNORMAL LOW (ref 13.0–17.0)
MCH: 28.2 pg (ref 26.0–34.0)
MCHC: 29.7 g/dL — ABNORMAL LOW (ref 30.0–36.0)
MCV: 94.6 fL (ref 78.0–100.0)
Platelets: 151 10*3/uL (ref 150–400)
RBC: 3.73 MIL/uL — ABNORMAL LOW (ref 4.22–5.81)
RDW: 18.6 % — ABNORMAL HIGH (ref 11.5–15.5)
WBC: 7 10*3/uL (ref 4.0–10.5)

## 2016-03-01 LAB — HIV ANTIBODY (ROUTINE TESTING W REFLEX): HIV Screen 4th Generation wRfx: NONREACTIVE

## 2016-03-01 MED ORDER — OXYCODONE-ACETAMINOPHEN 10-325 MG PO TABS: 1.0000 | ORAL_TABLET | Freq: Four times a day (QID) | ORAL | 0 refills | Status: DC | PRN

## 2016-03-01 MED ORDER — MIDODRINE HCL 5 MG PO TABS
ORAL_TABLET | ORAL | Status: AC
  Administered 2016-03-01: 5 mg via ORAL
  Filled 2016-03-01: qty 1

## 2016-03-01 NOTE — Progress Notes (Signed)
  Progress Note    03/01/2016 8:24 AM 1 Day Post-Op  Subjective:  No complaints, no hd  Vitals:   03/01/16 0658 03/01/16 0730  BP: (!) 94/50 (!) 87/41  Pulse: 62 63  Resp:    Temp:      Physical Exam: [id number] Left thigh with palpable graft Wound vac to suction  CBC    Component Value Date/Time   WBC 7.0 03/01/2016 0224   RBC 3.73 (L) 03/01/2016 0224   HGB 10.5 (L) 03/01/2016 0224   HCT 35.3 (L) 03/01/2016 0224   PLT 151 03/01/2016 0224   MCV 94.6 03/01/2016 0224   MCH 28.2 03/01/2016 0224   MCHC 29.7 (L) 03/01/2016 0224   RDW 18.6 (H) 03/01/2016 0224   LYMPHSABS 0.9 01/14/2016 1300   MONOABS 0.6 01/14/2016 1300   EOSABS 0.1 01/14/2016 1300   BASOSABS 0.0 01/14/2016 1300    BMET    Component Value Date/Time   NA 133 (L) 03/01/2016 0224   K 5.8 (H) 03/01/2016 0224   CL 97 (L) 03/01/2016 0224   CO2 22 03/01/2016 0224   GLUCOSE 100 (H) 03/01/2016 0224   BUN 48 (H) 03/01/2016 0224   CREATININE 10.34 (H) 03/01/2016 0224   CALCIUM 9.2 03/01/2016 0224   GFRNONAA 5 (L) 03/01/2016 0224   GFRAA 6 (L) 03/01/2016 0224    INR    Component Value Date/Time   INR 1.05 01/14/2016 1300     Intake/Output Summary (Last 24 hours) at 03/01/16 0824 Last data filed at 03/01/16 0500  Gross per 24 hour  Intake             1090 ml  Output               30 ml  Net             1060 ml     Assessment:  51 y.o. male is s/p debridement left thigh with wound vac placement  Plan: Will need home health for wound vac Plan for dc when set up   [place]. Donzetta Matters, MD [facility] [place] Office: [phone] Pager: [phone]  03/01/2016 8:24 AM

## 2016-03-01 NOTE — Progress Notes (Signed)
[facility] Progress Note   Subjective: no c/o's   Inpatient medications: . aspirin  325 mg Oral Q1400  . calcium acetate  2,001 mg Oral TID WC  . cefUROXime (ZINACEF)  IV  1.5 g Intravenous Q24H  . clopidogrel  75 mg Oral Daily  . docusate sodium  100 mg Oral Daily  . famotidine  40 mg Oral Q24H  . heparin  5,000 Units Subcutaneous Q8H  . levothyroxine  125 mcg Oral QAC breakfast  . LORazepam  0.5 mg Oral QHS  . midodrine  5 mg Oral TID WC  . multivitamin  1 tablet Oral QHS  . pantoprazole  40 mg Oral Daily  . promethazine  25 mg Oral TID  . sodium chloride flush  3 mL Intravenous Q12H  . [START ON 03/04/2016] sodium polystyrene  15 g Oral Once per day on Sun Sat  . traZODone  100 mg Oral QHS  . zolpidem  10 mg Oral QHS    sodium chloride, sodium chloride, sodium chloride, acetaminophen **OR** acetaminophen, alteplase, alum & mag hydroxide-simeth, azelastine, bisacodyl, calcium acetate, fluticasone, furosemide, guaiFENesin-dextromethorphan, heparin, heparin, hydrALAZINE, labetalol, lidocaine (PF), lidocaine-prilocaine, metoprolol, morphine injection, ondansetron, oxyCODONE-acetaminophen **AND** oxyCODONE, pentafluoroprop-tetrafluoroeth, phenol, sodium chloride flush  Vitals:   03/01/16 0359 03/01/16 0653 03/01/16 0658 03/01/16 0730  BP: (!) 108/49 (!) 96/51 (!) 94/50 (!) 87/41  Pulse: 62 64 62 63  Resp: 20 13    Temp: 98.1 F (36.7 C) 98.4 F (36.9 C)    TempSrc: Oral Oral    SpO2: 98% 97%    Weight:  125 kg (275 lb 9.2 oz)    Height:        Exam: General: Well developed, well nourished, in no acute distress. Head: Normocephalic, atraumatic, sclera non-icteric. Neck: Supple without lymphadenopathy/masses.  Lungs: Clear bilaterally to auscultation without wheezes, rales, or rhonchi. Breathing is unlabored. Heart: RRR with normal S1, S2. 2/6 systolic murmur Abdomen: Soft, non-tender, non-distended. Musculoskeletal:  Strength and tone appear normal for  age. Lower extremities: Trace LLE edema. Scabbed area and wound vac present on L thigh. Neuro: Alert and oriented X 3. Moves all extremities spontaneously. Psych:  Responds to questions appropriately with a normal affect. Dialysis Access: R femoral TDC.   Dialysis Orders:  MWF at [facility] 4:30 hours, BFR400, DFR800, EDW 123.5kg, 1K/2.5Ca bath, TDC - Hectoral 75mcg IV q HD - Heparin 4100 bolus - Venofer 100mg  x 10 ordered (not started yet), no ESA  Assessment: 1.  L thigh AVG wound: S/p debridement and wound vac placement by vascular surgery 2/27. No abx ordered, plan per VVS 2. Hyperkalemia - 1k bath for 90 min  3.  ESRD: Will continue MWF schedule, next 2/28. No heparin with next HD, usually 1K bath as outpt, will follow labs. 4.  BP/volume: Chronically low BP, on midodrine. Lower than usual s/p anesthesia, monitor. 5.  Anemia: Hgb 13.3. No ESA needed. No IV iron ordered for now, can be resumed as outpt. 6.  Metabolic bone disease: Labs pending. Continue binders/VDRA.   Plan -  HD today, UF to dry wt   Recent Labs Lab 02/29/16 0654 02/29/16 1258 03/01/16 0224  NA 133*  --  133*  K 5.1  --  5.8*  CL  --   --  97*  CO2  --   --  22  GLUCOSE 97  --  100*  BUN  --   --  48*  CREATININE  --  9.30* 10.34*  CALCIUM  --   --  9.2    Recent Labs Lab 03/01/16 0224  AST 12*  ALT 9*  ALKPHOS 53  BILITOT 0.4  PROT 7.5  ALBUMIN 3.5    Recent Labs Lab 02/29/16 0654 02/29/16 1258 03/01/16 0224  WBC  --  6.7 7.0  HGB 13.3 11.0* 10.5*  HCT 39.0 37.1* 35.3*  MCV  --  94.6 94.6  PLT  --  156 151   Iron/TIBC/Ferritin/ %Sat No results found for: IRON, TIBC, FERRITIN, IRONPCTSAT

## 2016-03-01 NOTE — Care Management Note (Signed)
Case Management Note Marvetta Gibbons RN, BSN Unit 2W-Case Manager [phone]  Patient Details  Name: [name] MRN: [id number] Date of Birth: May 28, 1965  Subjective/Objective:   Pt admitted s/p debridement of left thigh wound with placement of wound VAC                 Action/Plan: PTA pt lived at home - orders placed for [facility]- spoke with pt at bedside to offer choice- per conversation pt states that he has referral from MD office for [facility] services that have already been set up with [facility] ([facility])- confirmed this with Tiffany at [facility]. Pt will need home wound VAC- [facility] wound VAC form placed on shadow chart for signature- once signed will start process for home wound VAC approval- have spoken to [facility] with [facility] to give "heads up" on DME need for VAC. -- [facility] will have to come to bedside prior to discharge to place home VAC on pt once VAC delivered. CM to follow for continued d/c needs.   Expected Discharge Date:                  Expected Discharge Plan:  [place]  In-House Referral:     Discharge planning Services  CM Consult  Post Acute Care Choice:  Home Health, Durable Medical Equipment Choice offered to:  Patient  DME Arranged:  Vac DME Agency:  [place] Arranged:  RN [facility] Agency:  [place]  Status of Service:  Completed, signed off  If discussed at [place] of Stay Meetings, dates discussed:    Discharge Disposition: home with home health   Additional Comments:  03/01/16- 1430- Marvetta Gibbons RN, CM- [facility] wound VAC form has been signed- [facility] is working on home wound [employer] approval- per Leroy Sea with [facility]- home wound VAC should be delivered by tomorrow and [facility] nurse will come to bedside to place home VAC on pt prior to discharge. Have also spoken with Santiago Glad at [facility].   Dawayne Patricia, RN 03/01/2016,  2:37 PM

## 2016-03-01 NOTE — Procedures (Signed)
Seen on HD , no c/o's.  BP was in 70's but no symptoms.  Is on midodrine. Up 1.5 kg by wt.  No new suggestions, HD today.    I was present at this dialysis session, have reviewed the session itself and made  appropriate changes [name] MD [place] pager [phone]   03/01/2016, 9:57 AM

## 2016-03-02 ENCOUNTER — Telehealth: Payer: Self-pay | Admitting: Vascular Surgery

## 2016-03-02 MED ORDER — NEPRO/CARBSTEADY PO LIQD
237.0000 mL | Freq: Two times a day (BID) | ORAL | Status: DC
  Administered 2016-03-02: 237 mL via ORAL
  Filled 2016-03-02 (×5): qty 237

## 2016-03-02 MED ORDER — DOXERCALCIFEROL 4 MCG/2ML IV SOLN
2.0000 ug | INTRAVENOUS | Status: DC
  Administered 2016-03-03: 2 ug via INTRAVENOUS
  Filled 2016-03-02: qty 2

## 2016-03-02 NOTE — Telephone Encounter (Signed)
Sched appt 03/17/16 at 9:00. Pt has MWF dialysis ending at 1pm. Resched appt 03/29/16 at 3:00. Spoke to pt to inform them of appt.

## 2016-03-02 NOTE — Progress Notes (Addendum)
  [facility] Progress Note  Subjective  - POD #2  Left thigh hurts.   Objective Vitals:   03/01/16 2030 03/02/16 0537  BP: (!) 89/38 (!) 94/36  Pulse: 71 (!) 55  Resp: 18 18  Temp: 99.2 F (37.3 C) 98.3 F (36.8 C)    Intake/Output Summary (Last 24 hours) at 03/02/16 0748 Last data filed at 03/02/16 1093  Gross per 24 hour  Intake                0 ml  Output             1500 ml  Net            -1500 ml   Left thigh VAC dressing taken off. Wound is clean with mild serosanguinous drainage.  Scab to lateral to open wound.   Assessment/Planning: 51 y.o. male is s/p: debridement of left thigh wound with placement of wound VAC 2 Days Post-Op   VAC dressing taken down and changed to home VAC. Ok for d/c home today, but patient wants to stay due to pain in left foot. Will address left foot pain as outpatient. Will check later this morning regarding discharge.  Home health services arranged.   Alvia Grove 03/02/2016 7:48 AM --  Laboratory CBC    Component Value Date/Time   WBC 7.0 03/01/2016 0224   HGB 10.5 (L) 03/01/2016 0224   HCT 35.3 (L) 03/01/2016 0224   PLT 151 03/01/2016 0224    BMET    Component Value Date/Time   NA 133 (L) 03/01/2016 0224   K 5.8 (H) 03/01/2016 0224   CL 97 (L) 03/01/2016 0224   CO2 22 03/01/2016 0224   GLUCOSE 100 (H) 03/01/2016 0224   BUN 48 (H) 03/01/2016 0224   CREATININE 10.34 (H) 03/01/2016 0224   CALCIUM 9.2 03/01/2016 0224   GFRNONAA 5 (L) 03/01/2016 0224   GFRAA 6 (L) 03/01/2016 0224    COAG Lab Results  Component Value Date   INR 1.05 01/14/2016   INR 1.1 08/23/2008   No results found for: PTT  Antibiotics Anti-infectives    Start     Dose/Rate Route Frequency Ordered Stop   03/01/16 0800  cefUROXime (ZINACEF) 1.5 g in dextrose 5 % 50 mL IVPB     1.5 g 100 mL/hr over 30 Minutes Intravenous Every 24 hours 02/29/16 1252 03/01/16 1404   02/29/16 0643  dextrose 5 % with cefUROXime (ZINACEF) ADS  Med    Comments:  Connye Burkitt   : cabinet override      02/29/16 0643 02/29/16 0918   02/29/16 0642  cefUROXime (ZINACEF) 1.5 g in dextrose 5 % 50 mL IVPB     1.5 g 100 mL/hr over 30 Minutes Intravenous 30 min pre-op 02/29/16 2355 02/29/16 [place], PA-C [facility] Office: [phone] Pager: [phone] 03/02/2016 7:48 AM  I have independently interviewed patient and agree with PA assessment and plan above. Home soon, will address issues with left foot as outpatient when left thigh issues have resolved.  Harleigh Civello C. Donzetta Matters, MD [facility] [place] Office: [phone] Pager: [phone]

## 2016-03-02 NOTE — Progress Notes (Signed)
Was the fall witnessed: no  Patient condition before and after the fall: stable, stable with hematoma  Patient's reaction to the fall: sorry  Name of the doctor that was notified including date and time: Dr. Bridgett Larsson, 19:40  Any interventions and vital signs: Patient assisted back to bed from bathroom after vital signs and assessment.  Patient given ice pack for hematoma to right forehead. Per MD will continue to monitor for any issues.  Patient understands he is not to strain in the restroom and not to get up on his own.

## 2016-03-02 NOTE — Progress Notes (Signed)
Patient asked for assistance in bathroom.  Patient stated he was straining and thought he was going to faint.  Patient has hematoma to right forehead.  Vitals taken and patient appears stable.  Dr. Bridgett Larsson made aware and requests monitoring for any issues. Pt resting with call bell within reach.  Will continue to monitor. Payton Emerald, RN

## 2016-03-02 NOTE — Telephone Encounter (Signed)
-----   Message from Mena Goes, RN sent at 03/01/2016  4:50 PM EST ----- Regarding: 2-3 week   ----- Message ----- From: Alvia Grove, PA-C Sent: 03/01/2016  12:14 PM To: Vvs Charge Pool  S/p debridement left thigh wound 02/29/16  F/u with Dr. Donzetta Matters in 2-3 weeks.  Thanks Maudie Mercury

## 2016-03-02 NOTE — Progress Notes (Signed)
[facility] Progress Note   Subjective: no c/o's - HD yest- removed 1500- some hypotension but does not seem unusual for him - VVS is saying he can be discharged today - pt not sure - has very large corn/wart on his left foot he wanted "taken care of " before he left and would just rather leave tomorrow after HD so does not need to get to HD tomorrow as OP  Inpatient medications: . aspirin  325 mg Oral Q1400  . calcium acetate  2,001 mg Oral TID WC  . clopidogrel  75 mg Oral Daily  . docusate sodium  100 mg Oral Daily  . famotidine  40 mg Oral Q24H  . heparin  5,000 Units Subcutaneous Q8H  . levothyroxine  125 mcg Oral QAC breakfast  . LORazepam  0.5 mg Oral QHS  . midodrine  5 mg Oral TID WC  . multivitamin  1 tablet Oral QHS  . pantoprazole  40 mg Oral Daily  . promethazine  25 mg Oral TID  . sodium chloride flush  3 mL Intravenous Q12H  . [START ON 03/04/2016] sodium polystyrene  15 g Oral Once per day on Sun Sat  . traZODone  100 mg Oral QHS  . zolpidem  10 mg Oral QHS    sodium chloride, acetaminophen **OR** acetaminophen, alum & mag hydroxide-simeth, azelastine, bisacodyl, calcium acetate, fluticasone, furosemide, guaiFENesin-dextromethorphan, hydrALAZINE, labetalol, metoprolol, morphine injection, ondansetron, oxyCODONE-acetaminophen **AND** oxyCODONE, phenol, sodium chloride flush  Vitals:   03/01/16 0359 03/01/16 0653 03/01/16 0658 03/01/16 0730  BP: (!) 108/49 (!) 96/51 (!) 94/50 (!) 87/41  Pulse: 62 64 62 63  Resp: 20 13    Temp: 98.1 F (36.7 C) 98.4 F (36.9 C)    TempSrc: Oral Oral    SpO2: 98% 97%    Weight:  125 kg (275 lb 9.2 oz)    Height:        Exam: General: Well developed, well nourished, in no acute distress. Head: Normocephalic, atraumatic, sclera non-icteric. Neck: Supple without lymphadenopathy/masses.  Lungs: Clear bilaterally to auscultation without wheezes, rales, or rhonchi. Breathing is unlabored. Heart: RRR with normal S1,  S2. 2/6 systolic murmur Abdomen: Soft, non-tender, non-distended. Musculoskeletal:  Strength and tone appear normal for age. Lower extremities: Trace LLE edema. Scabbed area and wound vac present on L thigh. Neuro: Alert and oriented X 3. Moves all extremities spontaneously. Psych:  Responds to questions appropriately with a normal affect. Dialysis Access: R femoral TDC. Left thigh AVG with thrill and also drain.  Left foot with elongated toenail and large wart/corn off great toe    Dialysis Orders:  MWF at [place] 4:30 hours, BFR400, DFR800, EDW 123.5kg, 1K/2.5Ca bath, TDC - Hectoral 5mcg IV q HD - Heparin 4100 bolus - Venofer 100mg  x 10 ordered (not started yet), no ESA  Assessment:/plan 1.  L thigh AVG wound: S/p debridement and wound vac placement by vascular surgery 2/27. No abx ordered, plan per VVS 2. Hyperkalemia - assume resolved after HD 3.  ESRD: Will continue MWF schedule, next 3/2. No heparin with next HD, usually 1K bath as outpt, will follow labs. Will write orders in case stays for first shift AM- using fem cath 4.  BP/volume: Chronically low BP, on midodrine.  5.  Anemia: Hgb 13.3- but now down to 10.5. No ESA needed as OP but likely will need as OP. No IV iron ordered for now, can be resumed as outpt. 6.  Metabolic bone disease: Labs pending.  Continue binders (phoslo) /VDRA- actually not ordered here as of yet, will do for tomorrow . 7. Foot issue- I told him this would be managed by podiatrist most likely and that is an OP thing.  We can certainly arrange appt as OP from the kidney center      Recent Labs Lab 02/29/16 0654 02/29/16 1258 03/01/16 0224  NA 133*  --  133*  K 5.1  --  5.8*  CL  --   --  97*  CO2  --   --  22  GLUCOSE 97  --  100*  BUN  --   --  48*  CREATININE  --  9.30* 10.34*  CALCIUM  --   --  9.2    Recent Labs Lab 03/01/16 0224  AST 12*  ALT 9*  ALKPHOS 53  BILITOT 0.4  PROT 7.5  ALBUMIN 3.5    Recent Labs Lab  02/29/16 0654 02/29/16 1258 03/01/16 0224  WBC  --  6.7 7.0  HGB 13.3 11.0* 10.5*  HCT 39.0 37.1* 35.3*  MCV  --  94.6 94.6  PLT  --  156 151   Iron/TIBC/Ferritin/ %Sat No results found for: IRON, TIBC, FERRITIN, IRONPCTSAT

## 2016-03-03 ENCOUNTER — Encounter (HOSPITAL_COMMUNITY): Payer: Self-pay

## 2016-03-03 ENCOUNTER — Emergency Department (HOSPITAL_COMMUNITY)
Admission: EM | Admit: 2016-03-03 | Discharge: 2016-03-03 | Disposition: A | Discharge status: Home / Self Care | Payer: Medicare Other | Attending: Emergency Medicine | Admitting: Emergency Medicine

## 2016-03-03 DIAGNOSIS — Z4889 Encounter for other specified surgical aftercare: Secondary | ICD-10-CM

## 2016-03-03 DIAGNOSIS — Z4801 Encounter for change or removal of surgical wound dressing: Secondary | ICD-10-CM | POA: Insufficient documentation

## 2016-03-03 DIAGNOSIS — N186 End stage renal disease: Secondary | ICD-10-CM | POA: Insufficient documentation

## 2016-03-03 DIAGNOSIS — E039 Hypothyroidism, unspecified: Secondary | ICD-10-CM | POA: Insufficient documentation

## 2016-03-03 DIAGNOSIS — Z992 Dependence on renal dialysis: Secondary | ICD-10-CM | POA: Insufficient documentation

## 2016-03-03 DIAGNOSIS — I120 Hypertensive chronic kidney disease with stage 5 chronic kidney disease or end stage renal disease: Secondary | ICD-10-CM | POA: Insufficient documentation

## 2016-03-03 DIAGNOSIS — Z7982 Long term (current) use of aspirin: Secondary | ICD-10-CM | POA: Insufficient documentation

## 2016-03-03 LAB — RENAL FUNCTION PANEL
Albumin: 3.3 g/dL — ABNORMAL LOW (ref 3.5–5.0)
Anion gap: 14 (ref 5–15)
BUN: 58 mg/dL — ABNORMAL HIGH (ref 6–20)
CO2: 25 mmol/L (ref 22–32)
Calcium: 9.6 mg/dL (ref 8.9–10.3)
Chloride: 96 mmol/L — ABNORMAL LOW (ref 101–111)
Creatinine, Ser: 9.47 mg/dL — ABNORMAL HIGH (ref 0.61–1.24)
GFR calc Af Amer: 7 mL/min — ABNORMAL LOW (ref >60)
GFR calc non Af Amer: 6 mL/min — ABNORMAL LOW (ref >60)
Glucose, Bld: 89 mg/dL (ref 65–99)
Phosphorus: 4.9 mg/dL — ABNORMAL HIGH (ref 2.5–4.6)
Potassium: 4.9 mmol/L (ref 3.5–5.1)
Sodium: 135 mmol/L (ref 135–145)

## 2016-03-03 LAB — CBC
HCT: 35 % — ABNORMAL LOW (ref 39.0–52.0)
Hemoglobin: 10.7 g/dL — ABNORMAL LOW (ref 13.0–17.0)
MCH: 28.6 pg (ref 26.0–34.0)
MCHC: 30.6 g/dL (ref 30.0–36.0)
MCV: 93.6 fL (ref 78.0–100.0)
Platelets: 148 10*3/uL — ABNORMAL LOW (ref 150–400)
RBC: 3.74 MIL/uL — ABNORMAL LOW (ref 4.22–5.81)
RDW: 18.5 % — ABNORMAL HIGH (ref 11.5–15.5)
WBC: 7.5 10*3/uL (ref 4.0–10.5)

## 2016-03-03 MED ORDER — MIDODRINE HCL 5 MG PO TABS
ORAL_TABLET | ORAL | Status: AC
  Filled 2016-03-03: qty 1

## 2016-03-03 MED ORDER — DOXERCALCIFEROL 4 MCG/2ML IV SOLN
INTRAVENOUS | Status: AC
  Filled 2016-03-03: qty 2

## 2016-03-03 NOTE — ED Provider Notes (Signed)
[facility] DEPT Provider Note    By signing my name below, I, Bea Graff, attest that this documentation has been prepared under the direction and in the presence of [facility], [place]. Electronically Signed: Bea Graff, ED Scribe. 03/03/16. 2:45 AM.    History   Chief Complaint Chief Complaint  Patient presents with  . Wound Check    The history is provided by the patient and medical records. No language interpreter was used.    [name] is an obese 51 y.o. male who presents to the Emergency Department wanting a wound check. He states he had a wound debridement three days ago and had a wound vac placed. He was discharged from the hospital earlier today and states the wound vac started malfunctioning and an alarm was sounding. He called the home health nurse and was instructed on how to fix the alarm that but then noticed the drain was not functioning properly. He was then told to come in to have it checked. Pt states he has all the supplies with him to have the drain replaced but did not know how to do it himself. He has taken Percocet for pain relief but has not had any since earlier this afternoon. There are no modifying factors noted. He denies fever, chills, nausea, vomiting.   Past Medical History:  Diagnosis Date  . Anemia   . Anxiety   . End stage renal failure on dialysis [facility])    "[place]; MWF" (02/29/2016)  . GERD (gastroesophageal reflux disease)   . Grand mal seizure ([place]) X 4   last 1998 (02/29/2016)  . History of blood transfusion 2000s   "I was having blood loss; never found out from where"  . History of kidney stones   . Hypertension    hx of - has not taken bp meds in over 2 years  . Hypothyroidism   . Insomnia   . Migraine    "due to my BP in my late 1990s; none since" (02/29/2016)  . Nonhealing surgical wound    of the left arteriovenous graft  . Pneumonia 2000s X 1  . Shortness of breath    rarely  . Spina bifida [facility])      Patient Active Problem List   Diagnosis Date Noted  . Nonhealing surgical wound 02/29/2016  . Clotted dialysis access ([place]) 01/14/2016  . Removal of staples 07/19/2012  . Other complications due to renal dialysis device, implant, and graft 06/27/2012  . End stage renal disease ([place]) 06/27/2012    Past Surgical History:  Procedure Laterality Date  . APPENDECTOMY    . ARTERIOVENOUS GRAFT PLACEMENT Left 10/16/2012   left femoral goretex graft         Dr Donnetta Hutching  . AV FISTULA PLACEMENT Left 06/27/2012   Procedure: EXPLORATORY LEFT THY-GRAFT PSEUDO-ANEURYSM;  Surgeon: Conrad Car[place], MD;  Location: [place];  Service: Vascular;  Laterality: Left;  Revision of left Arteriovenus gortex graft in thigh.  . COLONOSCOPY    . I&D EXTREMITY Left 02/29/2016   Procedure: DEBRIDEMENT LEFT THIGH WOUND;  Surgeon: Waynetta Sandy, MD;  Location: [place];  Service: Vascular;  Laterality: Left;  . ILEOSTOMY  1970s  . INCISION AND DRAINAGE Left 10/16/2012   Procedure: INCISION AND Debridement left thigh graft;  Surgeon: Rosetta Posner, MD;  Location: [place];  Service: Vascular;  Laterality: Left;  . INSERTION OF DIALYSIS CATHETER    . INSERTION OF DIALYSIS CATHETER  01/14/2016   Procedure: INSERTION OF DIALYSIS  CATHETER;  Surgeon: Waynetta Sandy, MD;  Location: [place];  Service: Vascular;;  . INSERTION OF ILIAC STENT Left 01/14/2016   Procedure: INSERTION OF ILIAC STENT;  Surgeon: Waynetta Sandy, MD;  Location: [place];  Service: Vascular;  Laterality: Left;  . INTRAOPERATIVE ARTERIOGRAM Left 01/14/2016   Procedure: INTRA OPERATIVE ARTERIOGRAM;  Surgeon: Waynetta Sandy, MD;  Location: [place];  Service: Vascular;  Laterality: Left;  . LITHOTRIPSY  x 3   "& a laser treatment" (02/29/2016)  . NEPHROSTOMY Bilateral 1968  . PATELLAR TENDON REPAIR Right 1990s   "big incision"  . PERIPHERAL VASCULAR CATHETERIZATION  01/14/2016   Procedure: A/V SHUNTOGRAM;  Surgeon: Waynetta Sandy, MD;  Location: [place];  Service: Vascular;;  . REVISION OF ARTERIOVENOUS GORETEX GRAFT Left 10/16/2012   Procedure: REVISION OF LEFT FEMORAL LOOP ARTERIOVENOUS GORETEX GRAFT;  Surgeon: Rosetta Posner, MD;  Location: [place];  Service: Vascular;  Laterality: Left;  . REVISION OF ARTERIOVENOUS GORETEX GRAFT Left 02/11/2015   Procedure: EXCISION OF SMALL SEGMENT OF EXPOSED LEFT THIGH NON FUNCTIONING  ARTERIOVENOUS GORETEX GRAFT;  Surgeon: Mal Misty, MD;  Location: [place];  Service: Vascular;  Laterality: Left;  . REVISION OF ARTERIOVENOUS GORETEX GRAFT Left 01/14/2016   Procedure: REVISION OF ARTERIOVENOUS GORETEX GRAFT;  Surgeon: Waynetta Sandy, MD;  Location: [place];  Service: Vascular;  Laterality: Left;  . REVISION OF ARTERIOVENOUS GORETEX GRAFT Left 02/29/2016   thigh/pt report  . REVISION OF ARTERIOVENOUS GORETEX GRAFT Left 02/29/2016   Procedure: POSSIBLE REVISION OF LEFT THIGH ARTERIOVENOUS GORETEX GRAFT;  Surgeon: Waynetta Sandy, MD;  Location: [place];  Service: Vascular;  Laterality: Left;  . THROMBECTOMY W/ EMBOLECTOMY Left 01/14/2016   Procedure: THROMBECTOMY ARTERIOVENOUS GORE-TEX Left thigh GRAFT;  Surgeon: Waynetta Sandy, MD;  Location: [place];  Service: Vascular;  Laterality: Left;  . TONGUE SURGERY  ~ 1990   tongue-tie release   . WOUND DEBRIDEMENT Left 02/29/2016   thigh       Home Medications    Prior to Admission medications   Medication Sig Start Date End Date Taking? Authorizing Provider  aspirin 325 MG tablet Take 325 mg by mouth daily at 2 PM.     Historical Provider, MD  azelastine (ASTELIN) 137 MCG/SPRAY nasal spray Place 1 spray into both nostrils 2 (two) times daily. Use in each nostril as directed     Historical Provider, MD  calcium acetate (PHOSLO) 667 MG capsule Take 1,334-2,001 mg by mouth See admin instructions. Take 3 capsules (2001 mg) by mouth 3 times daily with meals, take 2 capsules (1334 mg) with snacks    Historical  Provider, MD  clopidogrel (PLAVIX) 75 MG tablet Take 1 tablet (75 mg total) by mouth daily. 01/17/16   Alvia [place], PA-C  famotidine (PEPCID) 40 MG tablet Take 40 mg by mouth daily at 2 PM.     Historical Provider, MD  fluticasone (FLONASE) 50 MCG/ACT nasal spray Place 2 sprays into both nostrils daily as needed (congestion).     Historical Provider, MD  furosemide (LASIX) 80 MG tablet Take 80 mg by mouth daily as needed (swelling/ shortness of breath).     Historical Provider, MD  levothyroxine (SYNTHROID, LEVOTHROID) 125 MCG tablet Take 125 mcg by mouth daily at 2 PM.     Historical Provider, MD  LORazepam (ATIVAN) 0.5 MG tablet Take 0.5 mg by mouth at bedtime.     Historical Provider, MD  midodrine (PROAMATINE) 5 MG  tablet Take 1 tablet (5 mg total) by mouth 3 (three) times daily with meals. 01/18/16   Alvia Grove, PA-C  multivitamin (RENA-VIT) TABS tablet Take 1 tablet by mouth daily.    Historical Provider, MD  oxyCODONE-acetaminophen (PERCOCET) 10-325 MG tablet Take 1 tablet by mouth 4 (four) times daily as needed for pain. 03/01/16   Alvia [place], PA-C  promethazine (PHENERGAN) 25 MG tablet Take 25 mg by mouth 3 (three) times daily.     Historical Provider, MD  sodium polystyrene (KAYEXALATE) powder Take 15 g by mouth See admin instructions. Take 15 g (mixed in water) by mouth on Saturdays and Sundays    Historical Provider, MD  traZODone (DESYREL) 100 MG tablet Take 100 mg by mouth at bedtime.    Historical Provider, MD  zolpidem (AMBIEN) 10 MG tablet Take 10 mg by mouth at bedtime.     Historical Provider, MD    Family History Family History  Problem Relation Age of Onset  . Diabetes Mother   . Hypertension Mother   . Heart disease Mother     before age 14  . Diabetes Father   . Heart attack Father     X's 3  . Diabetes Sister   . Bipolar disorder Sister     Social History Social History  Substance Use Topics  . Smoking status: Never Smoker  . Smokeless tobacco:  Never Used  . Alcohol use 0.0 oz/week     Comment: 02/29/2016 "nothing since  the mid 1990s     Allergies   Furadantin [nitrofurantoin]; Mandelamine [methenamine]; Noroxin [norfloxacin]; Carmine; Contrast media [iodinated diagnostic agents]; Hydrocodone; Metrizamide; Sulfa antibiotics; and Sulfur   Review of Systems Review of Systems  Constitutional: Negative for chills and fever.  Gastrointestinal: Negative for nausea and vomiting.  Skin: Positive for wound.  Neurological: Positive for headaches (mild s/p fall during hospitalization).  Psychiatric/Behavioral: Negative for confusion.     Physical Exam Updated Vital Signs BP (!) 89/48 (BP Location: Right Arm)   Pulse 83   Temp 98.6 F (37 C) (Oral)   SpO2 99%   Physical Exam  Constitutional: He is oriented to person, place, and time. He appears well-developed and well-nourished. No distress.  Eyes: EOM are normal.  Neck: Neck supple.  Cardiovascular: Normal rate.   Pulmonary/Chest: Effort normal.  Musculoskeletal: Normal range of motion.  Neurological: He is alert and oriented to person, place, and time. No cranial nerve deficit.  Skin:  Wound vac with scant amount of dark blood in the tubing.  Psychiatric: He has a normal mood and affect.  Nursing note and vitals reviewed.    ED Treatments / Results  DIAGNOSTIC STUDIES: Oxygen Saturation is 99% on RA, normal by my interpretation.   COORDINATION OF CARE: 10:16 PM- Will contact charge to see should replace wound vac. Pt verbalizes understanding and agrees to plan.  Medications - No data to display  Labs (all labs ordered are listed, but only abnormal results are displayed) Labs Reviewed - No data to display   Radiology No results found.  Procedures Procedures (including critical care time)  Medications Ordered in ED Medications - No data to display   Initial Impression / Assessment and Plan / ED Course  I have reviewed the triage vital signs and the  nursing notes. Patient returns for check of wound on left upper thigh. Pt was discharged from the hospital earlier today after having a wound vac placed three days ago. Wound Vac changed by RN  here in the ED. Patient   afebrile and hemodynamically stable. Pt is instructed to continue with home care with the Home Health Nurse.Pt has a good understanding of return precautions and is safe for discharge at this time.  I personally performed the services described in this documentation, which was scribed in my presence. The recorded information has been reviewed and is accurate.   Final Clinical Impressions(s) / ED Diagnoses   Final diagnoses:  Encounter for post surgical wound check    New Prescriptions Discharge Medication List as of 03/03/2016 10:59 PM       Ashley Murrain, NP 03/04/16 [place], MD 03/05/16 0006

## 2016-03-03 NOTE — Progress Notes (Signed)
[facility] Progress Note   Subjective: Seen on HD- had fall last night - balking about going home but I told him it would be the plan    Inpatient medications: . aspirin  325 mg Oral Q1400  . calcium acetate  2,001 mg Oral TID WC  . clopidogrel  75 mg Oral Daily  . docusate sodium  100 mg Oral Daily  . doxercalciferol  2 mcg Intravenous Q M,W,F-HD  . famotidine  40 mg Oral Q24H  . feeding supplement (NEPRO CARB STEADY)  237 mL Oral BID BM  . heparin  5,000 Units Subcutaneous Q8H  . levothyroxine  125 mcg Oral QAC breakfast  . LORazepam  0.5 mg Oral QHS  . midodrine      . midodrine  5 mg Oral TID WC  . multivitamin  1 tablet Oral QHS  . pantoprazole  40 mg Oral Daily  . promethazine  25 mg Oral TID  . sodium chloride flush  3 mL Intravenous Q12H  . [START ON 03/04/2016] sodium polystyrene  15 g Oral Once per day on Sun Sat  . traZODone  100 mg Oral QHS  . zolpidem  10 mg Oral QHS    sodium chloride, acetaminophen **OR** acetaminophen, alum & mag hydroxide-simeth, azelastine, bisacodyl, calcium acetate, fluticasone, furosemide, guaiFENesin-dextromethorphan, hydrALAZINE, labetalol, metoprolol, morphine injection, ondansetron, oxyCODONE-acetaminophen **AND** oxyCODONE, phenol, sodium chloride flush  Vitals:   03/01/16 0359 03/01/16 0653 03/01/16 0658 03/01/16 0730  BP: (!) 108/49 (!) 96/51 (!) 94/50 (!) 87/41  Pulse: 62 64 62 63  Resp: 20 13    Temp: 98.1 F (36.7 C) 98.4 F (36.9 C)    TempSrc: Oral Oral    SpO2: 98% 97%    Weight:  125 kg (275 lb 9.2 oz)    Height:        Exam: General: Well developed, well nourished, in no acute distress.  Lungs: Clear bilaterally to auscultation without wheezes, rales, or rhonchi. Breathing is unlabored. Heart: RRR with normal S1, S2. 2/6 systolic murmur Abdomen: Soft, non-tender, non-distended. Lower extremities: Trace LLE edema. Scabbed area and wound vac present on L thigh. Dialysis Access: R femoral TDC. Left  thigh AVG with thrill and also drain.  Left foot with elongated toenail and large wart/corn off great toe    Dialysis Orders:  MWF at [facility] 4:30 hours, BFR400, DFR800, EDW 123.5kg, 1K/2.5Ca bath, TDC - Hectoral 46mcg IV q HD - Heparin 4100 bolus - Venofer 100mg  x 10 ordered (not started yet), no ESA  Assessment:/plan 1.  L thigh AVG wound: S/p debridement and wound vac placement by vascular surgery 2/27. No abx ordered, plan per [facility]- for discharge.  Pt balking again but I think it will happen 2. Hyperkalemia - resolved after HD 3.  ESRD: Will continue MWF schedule, doing today. No heparin with  HD, usually 1K bath as outpt, will follow labs. Will write orders in case stays for first shift AM- using fem cath 4.  BP/volume: Chronically low BP, on midodrine.  5.  Anemia: Hgb 13.3- but now down to 10.7. No ESA needed as OP but likely will need as OP. No IV iron ordered for now, can be resumed as outpt. 6.  Metabolic bone disease:  Continue binders (phoslo) /VDRA. 7. Foot issue- I told him this would be managed by podiatrist most likely and that is an OP thing.  We can certainly arrange appt as OP from the kidney center      Recent  Labs Lab 02/29/16 0654 02/29/16 1258 03/01/16 0224 03/03/16 0102  NA 133*  --  133* 135  K 5.1  --  5.8* 4.9  CL  --   --  97* 96*  CO2  --   --  22 25  GLUCOSE 97  --  100* 89  BUN  --   --  48* 58*  CREATININE  --  9.30* 10.34* 9.47*  CALCIUM  --   --  9.2 9.6  PHOS  --   --   --  4.9*    Recent Labs Lab 03/01/16 0224 03/03/16 0102  AST 12*  --   ALT 9*  --   ALKPHOS 53  --   BILITOT 0.4  --   PROT 7.5  --   ALBUMIN 3.5 3.3*    Recent Labs Lab 02/29/16 1258 03/01/16 0224 03/03/16 0102  WBC 6.7 7.0 7.5  HGB 11.0* 10.5* 10.7*  HCT 37.1* 35.3* 35.0*  MCV 94.6 94.6 93.6  PLT 156 151 148*   Iron/TIBC/Ferritin/ %Sat No results found for: IRON, TIBC, FERRITIN, IRONPCTSAT

## 2016-03-03 NOTE — Progress Notes (Signed)
Spoke with Dr Donzetta Matters, MD states patient will DC today, he or PA will place order. CM updated patient at bedside and called patient's sister for transportation. CM updated bedside RN of DC plan for today. Home VAC in place, CM verified [place] with [facility] clinical liaison Jocelyn Lamer.

## 2016-03-03 NOTE — Discharge Instructions (Signed)
Negative Pressure Wound Therapy °What is negative pressure wound therapy? °Negative pressure wound therapy (NPWT) is a device that helps your wounds heal. NPWT helps your wound stay clean and healthy while it heals from the inside. °NPWT uses a bandage (dressing) that is made of a sponge or gauze-like material. This dressing is placed on or inside the wound. The wound is then covered and sealed with a cover dressing that sticks to your skin (adhesive). This keeps air out. A tube connects the cover dressing to a small pump. The pump sucks fluid and germs from the wound. The pump also controls any odor coming from the wound. °What are the benefits of NPWT? °The benefits of NPWT may include: °· Faster healing. °· Lower risk of infection. °· Decrease in swelling and how much fluid is in the wound. °· Fewer dressing changes. °· Ability to treat your wound at home. °· Shorter hospital stay. °· Less pain. °What are the risks of NPWT? °NPWT is usually safe to use. The most common problem is skin irritation from the dressing adhesive, but there are many ways to help prevent this from happening. However, more serious problems can develop, such as: °· Bleeding. °· Infection. °· Dehydration. °· Pain. °What do I need to do to care for my wound? °· Do not take off the dressing yourself unless told to do so by your health care provider. °· Keep all follow-up visits as told by your health care provider. This is important. °· Make sure you know how to change your dressing, if you will be doing this at home. °· Keep the area clean and dry. °· Ask your health care provider for the best way to protect your skin from becoming irritated by the adhesive. °What do I need to know about the pump? °· Do not turn off the pump yourself unless told to do so by your health care provider, such as for bathing. °· Do not turn off the pump for more than two hours. If the pump is off for more than two hours, the dressing will need to be changed. °· If  your health care provider says it is okay to shower: °¨ Do not take the pump into the shower. °¨ Make sure the wound dressing is protected and sealed. The wound area must stay dry. °· Check frequently that the machine is on, that the machine indicates the therapy is on, and that all clamps are open. °· If the alarm sounds: °¨ Stay calm. °¨ Do not turn off the pump or do anything with the dressing. °¨ Call your health care provider right away if you cannot fix the problem. The alarm may go off because the battery is low, the dressing has a leak, or the fluid collection container is full. °¨ Explain to your health care provider what is happening. Follow his or her instructions. °When should I seek medical care? °Seek medical care if: °· You have new pain. °· You develop irritation, a rash, or itching around the wound or dressing. °· You see new black or yellow tissue in your wound. °· The dressing changes are painful or cause bleeding. °·  °· The pump alarm goes off and you do not know what to do. °When should I seek immediate medical care? °Seek immediate medical care if: °· You have a lot of bleeding. °· You see a sudden change in the color or texture of the drainage. °· The wound breaks open. °· You have severe pain. °·   You have signs of infection, such as: °¨ More redness, swelling, or pain. °¨ More fluid or blood. °¨ Warmth. °¨ Pus or a bad smell. °¨ Red streaks leading from wound. °¨ A fever. °This information is not intended to replace advice given to you by your health care provider. Make sure you discuss any questions you have with your health care provider. °Document Released: 12/02/2007 Document Revised: 11/16/2015 Document Reviewed: 09/24/2014 °Elsevier Interactive Patient Education © 2017 [employer] ° °

## 2016-03-03 NOTE — Discharge Instructions (Signed)
We have changed your wound vac tonight. If you have problems call the Home health nurse or return here.

## 2016-03-03 NOTE — Progress Notes (Signed)
Pt sister called and stated she did not know pt was coming home today. Lynnae Sandhoff, [place] vascular) to confirm d/c, made her aware of pt stating he felt dizzy to sister but did not tell nursing staff, and that sister concern of wound vac not being changed tomorrow. Spoke with pt, vital signs are stable,pt did state he just feels bad and if hes discharge and he felt bad he would just come back. Explained to pt that wound vac was changed yesterday and that it would be ok for [facility] to change Monday. Pt is ok with discharge, informed oncoming RN who will call pts sister when discharge is ready.

## 2016-03-03 NOTE — ED Triage Notes (Signed)
Pt states discharged from hospital this evening. Pt states [facility] placed wound vac to L upper thigh. Pt states wound vac alarming "clogged." Pt told to come here for replacement dressing and tubing. Pt with no other complaints. A/o x 4, NAD.

## 2016-03-03 NOTE — Progress Notes (Signed)
  Progress Note    03/03/2016 12:23 PM 3 Days Post-Op  Subjective:  Fall last night, has right head pain but otherwise ok, not complaining of left thigh  Vitals:   03/03/16 1100 03/03/16 1136  BP: (!) 103/42 (!) 101/49  Pulse: 66 70  Resp: 16 16  Temp:  98.5 F (36.9 C)    Physical Exam: aaox3 R forehead hematom Neuro in tact Left thigh wound vac to suction  CBC    Component Value Date/Time   WBC 7.5 03/03/2016 0102   RBC 3.74 (L) 03/03/2016 0102   HGB 10.7 (L) 03/03/2016 0102   HCT 35.0 (L) 03/03/2016 0102   PLT 148 (L) 03/03/2016 0102   MCV 93.6 03/03/2016 0102   MCH 28.6 03/03/2016 0102   MCHC 30.6 03/03/2016 0102   RDW 18.5 (H) 03/03/2016 0102   LYMPHSABS 0.9 01/14/2016 1300   MONOABS 0.6 01/14/2016 1300   EOSABS 0.1 01/14/2016 1300   BASOSABS 0.0 01/14/2016 1300    BMET    Component Value Date/Time   NA 135 03/03/2016 0102   K 4.9 03/03/2016 0102   CL 96 (L) 03/03/2016 0102   CO2 25 03/03/2016 0102   GLUCOSE 89 03/03/2016 0102   BUN 58 (H) 03/03/2016 0102   CREATININE 9.47 (H) 03/03/2016 0102   CALCIUM 9.6 03/03/2016 0102   GFRNONAA 6 (L) 03/03/2016 0102   GFRAA 7 (L) 03/03/2016 0102    INR    Component Value Date/Time   INR 1.05 01/14/2016 1300     Intake/Output Summary (Last 24 hours) at 03/03/16 1223 Last data filed at 03/03/16 1136  Gross per 24 hour  Intake              627 ml  Output             1065 ml  Net             -438 ml     Assessment:  51 y.o. male is s/p left thigh debridement, graft excision and wound vac placement.  Plan: Hd via right femoral catheter Left thigh graft can hopefully be used when wounds are healed Wound vac for home and discharge when ready Will handle left foot issue when other wounds are healed   Brandon C. Donzetta Matters, MD [facility] Office: [phone] Pager: [phone]  03/03/2016 12:23 PM

## 2016-03-03 NOTE — ED Notes (Signed)
Pt family requesting to speak with Early, MD.

## 2016-03-03 NOTE — Procedures (Signed)
Patient was seen on dialysis and the procedure was supervised.  BFR 320  Via PC BP is  86/44.   Patient appears to be tolerating treatment well  [name] A 03/03/2016

## 2016-03-06 NOTE — Discharge Summary (Signed)
[facility] Discharge Summary  [name] 1965/01/14 51 y.o. male  [id number]  Admission Date: 02/29/2016  Discharge Date: 03/03/2016  Physician: Servando Snare, MD  Admission Diagnosis: Nonhealing surgical wound left thigh  T81.89X End Stage Renal Disease N18.6  HPI:   This is a 51 y.o. male who presented for follow-up for evaluation of recent left AV graft revision with stenting of his venous outflow in the iliac vein. He has not had any fevers but has had some wound breakdown over the graft excision site. He continues on dialysis via right femoral tunneled dialysis catheter.  Hospital Course:  The patient was admitted to the hospital and taken to the operating room on 02/29/2016 and underwent: debridement of left thigh wound 5.5 cm x 1.5 cm x 1.0 cm with placement of wound vac.   The patient tolerated the procedure well and was transported to the PACU in stable condition.   Findings: The functional graft was initially traced with Doppler and noted to not be associated with the wound in the left thigh. We elected to just primarily debride the wound which had graft that was nonfunctional at one of its edges and had to be trimmed back. We also found a separate graft at the base of the wound was also removed and trimmed out of the wound. All wound was debrided to healthy tissue and wound VAC placed at completion. There was a signal in the graft that is functional at the completion of our procedure.  Nephrology was consulted for dialysis. The patient was stable for discharge on POD 1, but wanted to stay secondary to left foot pain. Plans were made to address his left foot pain as an outpatient. The patient's wound VAC was taken off on POD 2. The wound was clean with mild serosanguinous drainage. The patient experienced a fall while straining on the toilet and had a developed a right forehead hematoma. He was neurologically intact. He was discharged home on POD 3 in  good condition.   CBC    Component Value Date/Time   WBC 7.5 03/03/2016 0102   RBC 3.74 (L) 03/03/2016 0102   HGB 10.7 (L) 03/03/2016 0102   HCT 35.0 (L) 03/03/2016 0102   PLT 148 (L) 03/03/2016 0102   MCV 93.6 03/03/2016 0102   MCH 28.6 03/03/2016 0102   MCHC 30.6 03/03/2016 0102   RDW 18.5 (H) 03/03/2016 0102   LYMPHSABS 0.9 01/14/2016 1300   MONOABS 0.6 01/14/2016 1300   EOSABS 0.1 01/14/2016 1300   BASOSABS 0.0 01/14/2016 1300    BMET    Component Value Date/Time   NA 135 03/03/2016 0102   K 4.9 03/03/2016 0102   CL 96 (L) 03/03/2016 0102   CO2 25 03/03/2016 0102   GLUCOSE 89 03/03/2016 0102   BUN 58 (H) 03/03/2016 0102   CREATININE 9.47 (H) 03/03/2016 0102   CALCIUM 9.6 03/03/2016 0102   GFRNONAA 6 (L) 03/03/2016 0102   GFRAA 7 (L) 03/03/2016 0102     Discharge Instructions:   The patient is discharged to home with extensive instructions on wound care and progressive ambulation.  They are instructed not to drive or perform any heavy lifting until returning to see the physician in his office.  Discharge Instructions    Call MD for:  redness, tenderness, or signs of infection (pain, swelling, bleeding, redness, odor or green/yellow discharge around incision site)    Complete by:  As directed    Call MD for:  severe or increased  pain, loss or decreased feeling  in affected limb(s)    Complete by:  As directed    Call MD for:  temperature >100.5    Complete by:  As directed    Discharge wound care:    Complete by:  As directed    Wound VAC dressing change to left thigh every Tuesday, Thursday, Saturday with black foam at continuous suction 125 mmHg. This will be arranged with home health.   Driving Restrictions    Complete by:  As directed    No driving for 3 weeks   Increase activity slowly    Complete by:  As directed    Walk with assistance use walker or cane as needed   Lifting restrictions    Complete by:  As directed    No lifting for 2 weeks   Resume  previous diet    Complete by:  As directed       Discharge Diagnosis:  Nonhealing surgical wound left thigh  T81.89X End Stage Renal Disease N18.6  Secondary Diagnosis: Patient Active Problem List   Diagnosis Date Noted  . Nonhealing surgical wound 02/29/2016  . Clotted dialysis access ([place]) 01/14/2016  . Removal of staples 07/19/2012  . Other complications due to renal dialysis device, implant, and graft 06/27/2012  . End stage renal disease ([place]) 06/27/2012   Past Medical History:  Diagnosis Date  . Anemia   . Anxiety   . End stage renal failure on dialysis [facility])    "[place]; MWF" (02/29/2016)  . GERD (gastroesophageal reflux disease)   . Grand mal seizure ([place]) X 4   last 1998 (02/29/2016)  . History of blood transfusion 2000s   "I was having blood loss; never found out from where"  . History of kidney stones   . Hypertension    hx of - has not taken bp meds in over 2 years  . Hypothyroidism   . Insomnia   . Migraine    "due to my BP in my late 1990s; none since" (02/29/2016)  . Nonhealing surgical wound    of the left arteriovenous graft  . Pneumonia 2000s X 1  . Shortness of breath    rarely  . Spina bifida ([place])      Allergies as of 03/03/2016      Reactions   Furadantin [nitrofurantoin] Other (See Comments)   UNSPECIFIED REACTION    Mandelamine [methenamine] Other (See Comments)   UNSPECIFIED REACTION    Noroxin [norfloxacin] Other (See Comments)   UNSPECIFIED REACTION    Carmine Nausea Only   Contrast Media [iodinated Diagnostic Agents] Nausea And Vomiting   Oral dye causes vomiting, IV dye is okay   Hydrocodone Other (See Comments)   LIKELY NOT REACTION TO HYDROCODONE Pt states that after 3 weeks of taking this medication he will began to "twitch"   Metrizamide Nausea And Vomiting   Oral dye causes vomiting, IV dye is okay   Sulfa Antibiotics Cough   Childhood reaction - pt could not confirm that it was a cough   Sulfur Cough   Childhood reaction  - pt could not confirm that it was a cough      Medication List    TAKE these medications   aspirin 325 MG tablet Take 325 mg by mouth daily at 2 PM.   ASTELIN 0.1 % nasal spray Generic drug:  azelastine Place 1 spray into both nostrils 2 (two) times daily. Use in each nostril as directed   calcium acetate  667 MG capsule Commonly known as:  PHOSLO Take 1,334-2,001 mg by mouth See admin instructions. Take 3 capsules (2001 mg) by mouth 3 times daily with meals, take 2 capsules (1334 mg) with snacks   clopidogrel 75 MG tablet Commonly known as:  PLAVIX Take 1 tablet (75 mg total) by mouth daily.   famotidine 40 MG tablet Commonly known as:  PEPCID Take 40 mg by mouth daily at 2 PM.   fluticasone 50 MCG/ACT nasal spray Commonly known as:  FLONASE Place 2 sprays into both nostrils daily as needed (congestion).   furosemide 80 MG tablet Commonly known as:  LASIX Take 80 mg by mouth daily as needed (swelling/ shortness of breath).   levothyroxine 125 MCG tablet Commonly known as:  SYNTHROID, LEVOTHROID Take 125 mcg by mouth daily at 2 PM.   LORazepam 0.5 MG tablet Commonly known as:  ATIVAN Take 0.5 mg by mouth at bedtime.   midodrine 5 MG tablet Commonly known as:  PROAMATINE Take 1 tablet (5 mg total) by mouth 3 (three) times daily with meals.   multivitamin Tabs tablet Take 1 tablet by mouth daily.   oxyCODONE-acetaminophen 10-325 MG tablet Commonly known as:  PERCOCET Take 1 tablet by mouth 4 (four) times daily as needed for pain.   promethazine 25 MG tablet Commonly known as:  PHENERGAN Take 25 mg by mouth 3 (three) times daily.   sodium polystyrene powder Commonly known as:  KAYEXALATE Take 15 g by mouth See admin instructions. Take 15 g (mixed in water) by mouth on Saturdays and Sundays   traZODone 100 MG tablet Commonly known as:  DESYREL Take 100 mg by mouth at bedtime.   zolpidem 10 MG tablet Commonly known as:  AMBIEN Take 10 mg by mouth at  bedtime.       Percocet #30 No Refill  Disposition: Home  Patient's condition: is Good  Follow up: 1. Dr. Donzetta Matters in 2 weeks   Virgina Jock, PA-C [facility] [phone] 03/06/2016  11:47 AM

## 2016-03-16 ENCOUNTER — Encounter: Payer: Self-pay | Admitting: Vascular Surgery

## 2016-03-17 ENCOUNTER — Encounter: Payer: Medicare Other | Admitting: Vascular Surgery

## 2016-03-29 ENCOUNTER — Ambulatory Visit (INDEPENDENT_AMBULATORY_CARE_PROVIDER_SITE_OTHER): Payer: Self-pay | Admitting: Vascular Surgery

## 2016-03-29 ENCOUNTER — Encounter: Payer: Self-pay | Admitting: Vascular Surgery

## 2016-03-29 VITALS — BP 110/58 | HR 82 | Temp 100.3°F | Resp 18 | Ht 71.0 in | Wt 273.4 lb

## 2016-03-29 DIAGNOSIS — N186 End stage renal disease: Secondary | ICD-10-CM

## 2016-03-29 DIAGNOSIS — Z992 Dependence on renal dialysis: Secondary | ICD-10-CM

## 2016-03-29 DIAGNOSIS — T859XXD Unspecified complication of internal prosthetic device, implant and graft, subsequent encounter: Secondary | ICD-10-CM

## 2016-03-29 NOTE — Progress Notes (Signed)
Subjective:     Patient ID: [name], male   DOB: 12-30-1965, 51 y.o.   MRN: [id number]  HPI 51 year old male well-known to the office returns for evaluation of left leg wound. He has recently undergone debridement of left thigh wound placement of wound VAC. He has been without fevers and chills currently dialyzing via right femoral tunneled dialysis catheter that was placed at the time of revision of his left thigh graft. He has had home health changing his wound VAC and is without complaints today regarding that issue. He remains on aspirin and Plavix for left iliac vein stenting as well as stenting of the outflow of his AV graft. He is having some difficulty with dialysis on his right femoral TDC with slow flow.   Review of Systems Pain in left thigh  no fevers or chills Denies rash    Objective:   Physical Exam aaox3 Left thigh wound reduced to skin level Large scab overlying graft is smaller in size than previous, no signs of infection Strong signal in thigh graft    Assessment/plan     51 year old male status post revision of left thigh graft placement right thigh tunneled catheter and subsequently had a wound where previous graft material had to be excised and wound VAC placed. He now follows up and has nearly healed his wound with a wound VAC was. He still has a scab overlying the graft and we are not touching this and it does not appear infected. The graft itself is patent. He can be given to use the thigh graft going to be careful with the scab is I'm afraid that removing that we'll expose the underlying graft and hopefully will heal and on top of it. Exposing the underlying graft would need at least revision and possible excision of the graft and we would have to start over in the right thigh which is his last access and there is at least stenosis of the veins on that side where was very difficult to place a tunneled catheter. He can follow-up on a when necessary basis.     Jonae Renshaw C. Donzetta Matters, MD [facility] [place] Office: [phone] Pager: [phone]

## 2016-03-30 ENCOUNTER — Encounter: Payer: Self-pay | Admitting: *Deleted

## 2016-06-05 NOTE — Addendum Note (Signed)
Addendum  created 06/05/16 1003 by Myrtie Soman, MD   Sign clinical note

## 2016-07-14 ENCOUNTER — Other Ambulatory Visit: Payer: Self-pay

## 2016-07-17 NOTE — Addendum Note (Signed)
Addendum  created 07/17/16 1712 by Annye Asa, MD   Sign clinical note

## 2016-07-18 ENCOUNTER — Ambulatory Visit (HOSPITAL_COMMUNITY)
Admission: RE | Admit: 2016-07-18 | Discharge: 2016-07-18 | Disposition: A | Discharge status: Home / Self Care | Payer: Medicare Other | Source: Ambulatory Visit | Attending: Vascular Surgery | Admitting: Vascular Surgery

## 2016-07-18 DIAGNOSIS — Q059 Spina bifida, unspecified: Secondary | ICD-10-CM | POA: Diagnosis not present

## 2016-07-18 DIAGNOSIS — N186 End stage renal disease: Secondary | ICD-10-CM | POA: Insufficient documentation

## 2016-07-18 DIAGNOSIS — Z87442 Personal history of urinary calculi: Secondary | ICD-10-CM | POA: Insufficient documentation

## 2016-07-18 DIAGNOSIS — E039 Hypothyroidism, unspecified: Secondary | ICD-10-CM | POA: Insufficient documentation

## 2016-07-18 DIAGNOSIS — G4700 Insomnia, unspecified: Secondary | ICD-10-CM | POA: Insufficient documentation

## 2016-07-18 DIAGNOSIS — I120 Hypertensive chronic kidney disease with stage 5 chronic kidney disease or end stage renal disease: Secondary | ICD-10-CM | POA: Insufficient documentation

## 2016-07-18 DIAGNOSIS — Z8249 Family history of ischemic heart disease and other diseases of the circulatory system: Secondary | ICD-10-CM | POA: Insufficient documentation

## 2016-07-18 DIAGNOSIS — Z7902 Long term (current) use of antithrombotics/antiplatelets: Secondary | ICD-10-CM | POA: Insufficient documentation

## 2016-07-18 DIAGNOSIS — Z818 Family history of other mental and behavioral disorders: Secondary | ICD-10-CM | POA: Insufficient documentation

## 2016-07-18 DIAGNOSIS — Z888 Allergy status to other drugs, medicaments and biological substances status: Secondary | ICD-10-CM | POA: Diagnosis not present

## 2016-07-18 DIAGNOSIS — Z881 Allergy status to other antibiotic agents status: Secondary | ICD-10-CM | POA: Diagnosis not present

## 2016-07-18 DIAGNOSIS — Z833 Family history of diabetes mellitus: Secondary | ICD-10-CM | POA: Insufficient documentation

## 2016-07-18 DIAGNOSIS — Z882 Allergy status to sulfonamides status: Secondary | ICD-10-CM | POA: Diagnosis not present

## 2016-07-18 DIAGNOSIS — K219 Gastro-esophageal reflux disease without esophagitis: Secondary | ICD-10-CM | POA: Insufficient documentation

## 2016-07-18 DIAGNOSIS — Z4901 Encounter for fitting and adjustment of extracorporeal dialysis catheter: Secondary | ICD-10-CM | POA: Diagnosis present

## 2016-07-18 DIAGNOSIS — Z91041 Radiographic dye allergy status: Secondary | ICD-10-CM | POA: Diagnosis not present

## 2016-07-18 DIAGNOSIS — Z79899 Other long term (current) drug therapy: Secondary | ICD-10-CM | POA: Insufficient documentation

## 2016-07-18 DIAGNOSIS — Z992 Dependence on renal dialysis: Secondary | ICD-10-CM | POA: Insufficient documentation

## 2016-07-18 DIAGNOSIS — Z7982 Long term (current) use of aspirin: Secondary | ICD-10-CM | POA: Insufficient documentation

## 2016-07-18 NOTE — Progress Notes (Signed)
[facility] SHORT STAY H&P  CC: ESRD   HPI: [name] is a 51 y.o. male who has been on HD through  functioning Hemodialysis access in the left lower extremity. They are here for HD catheter removal. Pt. denies signs of steal syndrome.  Past Medical History:  Diagnosis Date  . Anemia   . Anxiety   . End stage renal failure on dialysis [facility])    "[place]; MWF" (02/29/2016)  . GERD (gastroesophageal reflux disease)   . Grand mal seizure ([place]) X 4   last 1998 (02/29/2016)  . History of blood transfusion 2000s   "I was having blood loss; never found out from where"  . History of kidney stones   . Hypertension    hx of - has not taken bp meds in over 2 years  . Hypothyroidism   . Insomnia   . Migraine    "due to my BP in my late 1990s; none since" (02/29/2016)  . Nonhealing surgical wound    of the left arteriovenous graft  . Pneumonia 2000s X 1  . Shortness of breath    rarely  . Spina bifida ([place])     Family Hx Family History  Problem Relation Age of Onset  . Diabetes Mother   . Hypertension Mother   . Heart disease Mother        before age 21  . Diabetes Father   . Heart attack Father        X's 3  . Diabetes Sister   . Bipolar disorder Sister     Social HX Social History  Substance Use Topics  . Smoking status: Never Smoker  . Smokeless tobacco: Never Used  . Alcohol use 0.0 oz/week     Comment: 02/29/2016 "nothing since  the mid 1990s    Allergies Allergies  Allergen Reactions  . Hydrocodone Other (See Comments)    LIKELY NOT REACTION TO HYDROCODONE Pt states that after 3 weeks of taking this medication he will began to "twitch" Pt states that after 3 weeks of taking this medication he will began to "twitch" LIKELY NOT REACTION TO HYDROCODONE Pt states that after 3 weeks of taking this medication he will began to "twitch"  . Furadantin [Nitrofurantoin] Other (See Comments)    UNSPECIFIED REACTION   . Mandelamine [Methenamine]  Other (See Comments)    UNSPECIFIED REACTION   . Noroxin [Norfloxacin] Other (See Comments)    UNSPECIFIED REACTION   . Carmine Nausea Only  . Contrast Media [Iodinated Diagnostic Agents] Nausea And Vomiting    Oral dye causes vomiting, IV dye is okay  . Metrizamide Nausea And Vomiting    Oral dye causes vomiting, IV dye is okay  . Sulfa Antibiotics Cough    Childhood reaction - pt could not confirm that it was a cough  . Sulfur Cough    Childhood reaction - pt could not confirm that it was a cough    Medications Current Outpatient Prescriptions  Medication Sig Dispense Refill  . aspirin 325 MG tablet Take 325 mg by mouth daily at 2 PM.     . azelastine (ASTELIN) 137 MCG/SPRAY nasal spray Place 1 spray into both nostrils 2 (two) times daily. Use in each nostril as directed     . calcium acetate (PHOSLO) 667 MG capsule Take 1,334-2,001 mg by mouth See admin instructions. Take 3 capsules (2001 mg) by mouth 3 times daily with meals, take 2 capsules (1334 mg) with snacks    .  clopidogrel (PLAVIX) 75 MG tablet Take 1 tablet (75 mg total) by mouth daily. 30 tablet 3  . famotidine (PEPCID) 40 MG tablet Take 40 mg by mouth daily at 2 PM.     . fluticasone (FLONASE) 50 MCG/ACT nasal spray Place 2 sprays into both nostrils daily as needed (congestion).     . furosemide (LASIX) 80 MG tablet Take 80 mg by mouth daily as needed (swelling/ shortness of breath).     Marland Kitchen levothyroxine (SYNTHROID, LEVOTHROID) 125 MCG tablet Take 125 mcg by mouth daily at 2 PM.     . LORazepam (ATIVAN) 0.5 MG tablet Take 0.5 mg by mouth at bedtime.     . midodrine (PROAMATINE) 5 MG tablet Take 1 tablet (5 mg total) by mouth 3 (three) times daily with meals. 90 tablet 6  . multivitamin (RENA-VIT) TABS tablet Take 1 tablet by mouth daily.    Marland Kitchen oxyCODONE-acetaminophen (PERCOCET) 10-325 MG tablet Take 1 tablet by mouth 4 (four) times daily as needed for pain. 30 tablet 0  . promethazine (PHENERGAN) 25 MG tablet Take 25 mg by  mouth 3 (three) times daily.     . sodium polystyrene (KAYEXALATE) powder Take 15 g by mouth See admin instructions. Take 15 g (mixed in water) by mouth on Saturdays and Sundays    . traZODone (DESYREL) 100 MG tablet Take 100 mg by mouth at bedtime.    Marland Kitchen zolpidem (AMBIEN) 10 MG tablet Take 10 mg by mouth at bedtime.      No current facility-administered medications for this encounter.     Labs COAG Lab Results  Component Value Date   INR 1.05 01/14/2016   INR 1.1 08/23/2008   No results found for: PTT  PHYSICAL EXAM  There were no vitals filed for this visit.  General:  WDWN in NAD HENT: WNL Eyes: Pupils equal Pulmonary: normal non-labored breathing  Cardiac: RRR, Skin: normal, no cyanosis, jaundice, pallor or bruising Vascular Exam/Pulses: 2+  pulses in LEFT lower extremity. Extremities without ischemic changes, no Gangrene , no cellulitis; no open wounds;   There is a good thrill and good bruit in the left thigh graft. Hand grip is 0/5 and sensation in digits is intact;   Impression: This is a 51 y.o. male who has a functioning HD access.  Plan: Removal of Right IJ HD catheter Willowdean Luhmann MAUREEN 07/18/2016 11:19 AM  [facility] Catheter Removal Procedure Note  Diagnosis: ESRD with Functioning AVF/AVGG  Plan:  Remove right  Thigh diatek catheter  Consent signed:  yes Time out completed:  yes Coumadin:  No. PT/INR (if applicable):   Other labs:   Procedure: 1.  Sterile prepping and draping over catheter area 2. 0 ml 2% lidocaine plain instilled at removal site. 3.  left catheter removed in its entirety with cuff in tact. 4.  Complications: none  5. Tip of catheter sent for culture:  no   Patient tolerated procedure well:  yes Pressure held, no bleeding noted, dressing applied Instructions given to the pt regarding wound care and bleeding.  OtherLaurence Slate [facility] 07/18/2016 11:19 AM

## 2016-12-13 ENCOUNTER — Emergency Department (HOSPITAL_COMMUNITY): Payer: Medicare Other

## 2016-12-13 ENCOUNTER — Encounter (HOSPITAL_COMMUNITY): Payer: Self-pay | Admitting: Emergency Medicine

## 2016-12-13 ENCOUNTER — Other Ambulatory Visit: Payer: Self-pay

## 2016-12-13 ENCOUNTER — Emergency Department (HOSPITAL_COMMUNITY)
Admission: EM | Admit: 2016-12-13 | Discharge: 2016-12-13 | Disposition: A | Discharge status: Home / Self Care | Payer: Medicare Other | Attending: Emergency Medicine | Admitting: Emergency Medicine

## 2016-12-13 DIAGNOSIS — Z79899 Other long term (current) drug therapy: Secondary | ICD-10-CM | POA: Diagnosis not present

## 2016-12-13 DIAGNOSIS — N189 Chronic kidney disease, unspecified: Secondary | ICD-10-CM

## 2016-12-13 DIAGNOSIS — E039 Hypothyroidism, unspecified: Secondary | ICD-10-CM | POA: Diagnosis not present

## 2016-12-13 DIAGNOSIS — N186 End stage renal disease: Secondary | ICD-10-CM | POA: Insufficient documentation

## 2016-12-13 DIAGNOSIS — Z7982 Long term (current) use of aspirin: Secondary | ICD-10-CM | POA: Diagnosis not present

## 2016-12-13 DIAGNOSIS — R5383 Other fatigue: Secondary | ICD-10-CM | POA: Diagnosis not present

## 2016-12-13 DIAGNOSIS — I120 Hypertensive chronic kidney disease with stage 5 chronic kidney disease or end stage renal disease: Secondary | ICD-10-CM | POA: Diagnosis not present

## 2016-12-13 LAB — BASIC METABOLIC PANEL
Anion gap: 23 — ABNORMAL HIGH (ref 5–15)
BUN: 85 mg/dL — ABNORMAL HIGH (ref 6–20)
CO2: 22 mmol/L (ref 22–32)
Calcium: 8.9 mg/dL (ref 8.9–10.3)
Chloride: 88 mmol/L — ABNORMAL LOW (ref 101–111)
Creatinine, Ser: 13.58 mg/dL — ABNORMAL HIGH (ref 0.61–1.24)
GFR calc Af Amer: 4 mL/min — ABNORMAL LOW (ref >60)
GFR calc non Af Amer: 4 mL/min — ABNORMAL LOW (ref >60)
Glucose, Bld: 82 mg/dL (ref 65–99)
Potassium: 5 mmol/L (ref 3.5–5.1)
Sodium: 133 mmol/L — ABNORMAL LOW (ref 135–145)

## 2016-12-13 LAB — CBC WITH DIFFERENTIAL/PLATELET
Basophils Absolute: 0 10*3/uL (ref 0.0–0.1)
Basophils Relative: 0 %
Eosinophils Absolute: 0.1 10*3/uL (ref 0.0–0.7)
Eosinophils Relative: 1 %
HCT: 41.8 % (ref 39.0–52.0)
Hemoglobin: 13.4 g/dL (ref 13.0–17.0)
Lymphocytes Relative: 12 %
Lymphs Abs: 0.7 10*3/uL (ref 0.7–4.0)
MCH: 31.4 pg (ref 26.0–34.0)
MCHC: 32.1 g/dL (ref 30.0–36.0)
MCV: 97.9 fL (ref 78.0–100.0)
Monocytes Absolute: 0.3 10*3/uL (ref 0.1–1.0)
Monocytes Relative: 5 %
Neutro Abs: 5.2 10*3/uL (ref 1.7–7.7)
Neutrophils Relative %: 82 %
Platelets: 129 10*3/uL — ABNORMAL LOW (ref 150–400)
RBC: 4.27 MIL/uL (ref 4.22–5.81)
RDW: 16.9 % — ABNORMAL HIGH (ref 11.5–15.5)
WBC: 6.3 10*3/uL (ref 4.0–10.5)

## 2016-12-13 LAB — I-STAT CHEM 8, ED
BUN: 78 mg/dL — ABNORMAL HIGH (ref 6–20)
Calcium, Ion: 0.94 mmol/L — ABNORMAL LOW (ref 1.15–1.40)
Chloride: 95 mmol/L — ABNORMAL LOW (ref 101–111)
Creatinine, Ser: 14 mg/dL — ABNORMAL HIGH (ref 0.61–1.24)
Glucose, Bld: 81 mg/dL (ref 65–99)
HCT: 43 % (ref 39.0–52.0)
Hemoglobin: 14.6 g/dL (ref 13.0–17.0)
Potassium: 5 mmol/L (ref 3.5–5.1)
Sodium: 134 mmol/L — ABNORMAL LOW (ref 135–145)
TCO2: 25 mmol/L (ref 22–32)

## 2016-12-13 LAB — MAGNESIUM: Magnesium: 2.4 mg/dL (ref 1.7–2.4)

## 2016-12-13 MED ORDER — ONDANSETRON 4 MG PO TBDP
2.0000 mg | ORAL_TABLET | Freq: Once | ORAL | Status: AC
  Administered 2016-12-13: 2 mg via ORAL
  Filled 2016-12-13: qty 1

## 2016-12-13 NOTE — ED Triage Notes (Signed)
Pt states he is feeling very sick since he missed HD this last Monday due to the weather, pt believe that his potassium is high. Pt  regularly get HD MWF, last treatment last Friday.  BP 108/49 HR 72 R 14 SPO2 98% RA CBG 108

## 2016-12-13 NOTE — ED Notes (Signed)
Patient transported to X-ray 

## 2016-12-13 NOTE — ED Provider Notes (Signed)
[facility] EMERGENCY DEPARTMENT Provider Note   CSN: [id number] Arrival date & time: 12/13/16  0431     History   Chief Complaint Chief Complaint  Patient presents with  . Fatigue    HPI [name] is a 51 y.o. male.  The history is provided by the patient. No language interpreter was used.  Illness  This is a new problem. The current episode started 2 days ago. The problem occurs constantly. The problem has not changed since onset.Pertinent negatives include no chest pain, no abdominal pain, no headaches and no shortness of breath. Nothing aggravates the symptoms. Nothing relieves the symptoms. Treatments tried: kayexalate. The treatment provided no relief.  Due to the weather the patient has missed his dialysis at [facility] on Monday.  He had a make up scheduled on Tuesday but the transport did not come.  He feels fatigued.    Past Medical History:  Diagnosis Date  . Anemia   . Anxiety   . End stage renal failure on dialysis [facility])    "[place]; MWF" (02/29/2016)  . GERD (gastroesophageal reflux disease)   . Grand mal seizure ([place]) X 4   last 1998 (02/29/2016)  . History of blood transfusion 2000s   "I was having blood loss; never found out from where"  . History of kidney stones   . Hypertension    hx of - has not taken bp meds in over 2 years  . Hypothyroidism   . Insomnia   . Migraine    "due to my BP in my late 1990s; none since" (02/29/2016)  . Nonhealing surgical wound    of the left arteriovenous graft  . Pneumonia 2000s X 1  . Shortness of breath    rarely  . Spina bifida [facility])     Patient Active Problem List   Diagnosis Date Noted  . Nonhealing surgical wound 02/29/2016  . Clotted dialysis access ([place]) 01/14/2016  . Removal of staples 07/19/2012  . Other complications due to renal dialysis device, implant, and graft 06/27/2012  . End stage renal disease ([place]) 06/27/2012    Past Surgical History:  Procedure Laterality Date   . APPENDECTOMY    . ARTERIOVENOUS GRAFT PLACEMENT Left 10/16/2012   left femoral goretex graft         Dr Donnetta Hutching  . AV FISTULA PLACEMENT Left 06/27/2012   Procedure: EXPLORATORY LEFT THY-GRAFT PSEUDO-ANEURYSM;  Surgeon: Conrad Conetoe, MD;  Location: [place];  Service: Vascular;  Laterality: Left;  Revision of left Arteriovenus gortex graft in thigh.  . COLONOSCOPY    . I&D EXTREMITY Left 02/29/2016   Procedure: DEBRIDEMENT LEFT THIGH WOUND;  Surgeon: Waynetta Sandy, MD;  Location: [place];  Service: Vascular;  Laterality: Left;  . ILEOSTOMY  1970s  . INCISION AND DRAINAGE Left 10/16/2012   Procedure: INCISION AND Debridement left thigh graft;  Surgeon: Rosetta Posner, MD;  Location: [place];  Service: Vascular;  Laterality: Left;  . INSERTION OF DIALYSIS CATHETER    . INSERTION OF DIALYSIS CATHETER  01/14/2016   Procedure: INSERTION OF DIALYSIS CATHETER;  Surgeon: Waynetta Sandy, MD;  Location: [place];  Service: Vascular;;  . INSERTION OF ILIAC STENT Left 01/14/2016   Procedure: INSERTION OF ILIAC STENT;  Surgeon: Waynetta Sandy, MD;  Location: [place];  Service: Vascular;  Laterality: Left;  . INTRAOPERATIVE ARTERIOGRAM Left 01/14/2016   Procedure: INTRA OPERATIVE ARTERIOGRAM;  Surgeon: Waynetta Sandy, MD;  Location: [place];  Service: Vascular;  Laterality: Left;  . LITHOTRIPSY  x 3   "& a laser treatment" (02/29/2016)  . NEPHROSTOMY Bilateral 1968  . PATELLAR TENDON REPAIR Right 1990s   "big incision"  . PERIPHERAL VASCULAR CATHETERIZATION  01/14/2016   Procedure: A/V SHUNTOGRAM;  Surgeon: Waynetta Sandy, MD;  Location: [place];  Service: Vascular;;  . REVISION OF ARTERIOVENOUS GORETEX GRAFT Left 10/16/2012   Procedure: REVISION OF LEFT FEMORAL LOOP ARTERIOVENOUS GORETEX GRAFT;  Surgeon: Rosetta Posner, MD;  Location: [place];  Service: Vascular;  Laterality: Left;  . REVISION OF ARTERIOVENOUS GORETEX GRAFT Left 02/11/2015   Procedure: EXCISION OF SMALL SEGMENT OF  EXPOSED LEFT THIGH NON FUNCTIONING  ARTERIOVENOUS GORETEX GRAFT;  Surgeon: Mal Misty, MD;  Location: [place];  Service: Vascular;  Laterality: Left;  . REVISION OF ARTERIOVENOUS GORETEX GRAFT Left 01/14/2016   Procedure: REVISION OF ARTERIOVENOUS GORETEX GRAFT;  Surgeon: Waynetta Sandy, MD;  Location: [place];  Service: Vascular;  Laterality: Left;  . REVISION OF ARTERIOVENOUS GORETEX GRAFT Left 02/29/2016   thigh/pt report  . REVISION OF ARTERIOVENOUS GORETEX GRAFT Left 02/29/2016   Procedure: POSSIBLE REVISION OF LEFT THIGH ARTERIOVENOUS GORETEX GRAFT;  Surgeon: Waynetta Sandy, MD;  Location: [place];  Service: Vascular;  Laterality: Left;  . THROMBECTOMY W/ EMBOLECTOMY Left 01/14/2016   Procedure: THROMBECTOMY ARTERIOVENOUS GORE-TEX Left thigh GRAFT;  Surgeon: Waynetta Sandy, MD;  Location: [place];  Service: Vascular;  Laterality: Left;  . TONGUE SURGERY  ~ 1990   tongue-tie release   . WOUND DEBRIDEMENT Left 02/29/2016   thigh       Home Medications    Prior to Admission medications   Medication Sig Start Date End Date Taking? Authorizing Provider  aspirin 325 MG tablet Take 325 mg by mouth daily at 2 PM.     [provider]  azelastine (ASTELIN) 137 MCG/SPRAY nasal spray Place 1 spray into both nostrils 2 (two) times daily. Use in each nostril as directed     [provider]  calcium acetate (PHOSLO) 667 MG capsule Take 1,334-2,001 mg by mouth See admin instructions. Take 3 capsules (2001 mg) by mouth 3 times daily with meals, take 2 capsules (1334 mg) with snacks    [provider]  clopidogrel (PLAVIX) 75 MG tablet Take 1 tablet (75 mg total) by mouth daily. 01/17/16   Alvia Grove, PA-C  famotidine (PEPCID) 40 MG tablet Take 40 mg by mouth daily at 2 PM.     [provider]  fluticasone (FLONASE) 50 MCG/ACT nasal spray Place 2 sprays into both nostrils daily as needed (congestion).     [provider]    furosemide (LASIX) 80 MG tablet Take 80 mg by mouth daily as needed (swelling/ shortness of breath).     [provider]  levothyroxine (SYNTHROID, LEVOTHROID) 125 MCG tablet Take 125 mcg by mouth daily at 2 PM.     [provider]  LORazepam (ATIVAN) 0.5 MG tablet Take 0.5 mg by mouth at bedtime.     [provider]  midodrine (PROAMATINE) 5 MG tablet Take 1 tablet (5 mg total) by mouth 3 (three) times daily with meals. 01/18/16   Alvia Grove, PA-C  multivitamin (RENA-VIT) TABS tablet Take 1 tablet by mouth daily.    [provider]  oxyCODONE-acetaminophen (PERCOCET) 10-325 MG tablet Take 1 tablet by mouth 4 (four) times daily as needed for pain. 03/01/16   Alvia Grove, PA-C  promethazine (  PHENERGAN) 25 MG tablet Take 25 mg by mouth 3 (three) times daily.     [provider]  sodium polystyrene (KAYEXALATE) powder Take 15 g by mouth See admin instructions. Take 15 g (mixed in water) by mouth on Saturdays and Sundays    [provider]  traZODone (DESYREL) 100 MG tablet Take 100 mg by mouth at bedtime.    [provider]  zolpidem (AMBIEN) 10 MG tablet Take 10 mg by mouth at bedtime.     [provider]    Family History Family History  Problem Relation Age of Onset  . Diabetes Mother   . Hypertension Mother   . Heart disease Mother        before age 67  . Diabetes Father   . Heart attack Father        X's 3  . Diabetes Sister   . Bipolar disorder Sister     Social History Social History   Tobacco Use  . Smoking status: Never Smoker  . Smokeless tobacco: Never Used  Substance Use Topics  . Alcohol use: Yes    Alcohol/week: 0.0 oz    Comment: 02/29/2016 "nothing since  the mid 1990s  . Drug use: Yes    Types: Marijuana    Comment: 02/29/2016 "nothing since ~ 1995"     Allergies   Hydrocodone; Furadantin [nitrofurantoin]; Mandelamine [methenamine]; Noroxin [norfloxacin]; Carmine; Contrast media  [iodinated diagnostic agents]; Metrizamide; Sulfa antibiotics; and Sulfur   Review of Systems Review of Systems  Constitutional: Positive for fatigue. Negative for fever.  Respiratory: Negative for shortness of breath.   Cardiovascular: Negative for chest pain.  Gastrointestinal: Negative for abdominal pain.  Neurological: Negative for seizures, weakness, numbness and headaches.  All other systems reviewed and are negative.    Physical Exam Updated Vital Signs Ht 5\' 11"  (1.803 m)   Wt 124.5 kg (274 lb 7.6 oz)   BMI 38.28 kg/m   Physical Exam  Constitutional: He is oriented to person, place, and time. He appears well-developed and well-nourished. No distress.  HENT:  Head: Normocephalic and atraumatic.  Mouth/Throat: No oropharyngeal exudate.  Eyes: Conjunctivae are normal. Pupils are equal, round, and reactive to light.  Neck: Normal range of motion. Neck supple.  Cardiovascular: Normal rate, regular rhythm, normal heart sounds and intact distal pulses.  Pulmonary/Chest: No respiratory distress. He has no rales.  Abdominal: Soft. Bowel sounds are normal. There is no tenderness. There is no guarding.  Musculoskeletal: Normal range of motion.  Neurological: He is alert and oriented to person, place, and time.  Skin: Skin is warm and dry. Capillary refill takes less than 2 seconds.  Psychiatric: He has a normal mood and affect.  Nursing note and vitals reviewed.    ED Treatments / Results  Labs (all labs ordered are listed, but only abnormal results are displayed) Labs Reviewed  CBC WITH DIFFERENTIAL/PLATELET  BASIC METABOLIC PANEL  MAGNESIUM  I-STAT CHEM 8, ED    EKG  EKG Interpretation None       Radiology No results found.  Procedures Procedures (including critical care time)    Case d/w Dr. Joelyn Oms, no indication for admission will need to have a family member to bring him to dialysis.   Final Clinical Impressions(s) / ED Diagnoses   Follow up with  your PMD in 2 days for recheck, return for diarrhea intractable pain, inability to pass stool, rigid abdomen, rectal bleeding fevers > 101, intractable vomiting.  Strict return precautions for facial  swelling change in thinking or speech, fevers, stiff neck, intractable vomiting, or diarrhea, abdominal pain, Inability to tolerate liquids or food, cough, altered mental status or any concerns. No signs of systemic illness or infection. The patient is nontoxic-appearing on exam and vital signs are within normal limits.    I have reviewed the triage vital signs and the nursing notes. Pertinent labs &imaging results that were available during my care of the patient were reviewed by me and considered in my medical decision making (see chart for details).  After history, exam, and medical workup I feel the patient has been appropriately medically screened and is safe for discharge home. Pertinent diagnoses were discussed with the patient. Patient was given return precautions      Evelene Roussin, MD 12/13/16 [phone]

## 2016-12-13 NOTE — ED Provider Notes (Signed)
Went to Camera operator and have obtained a cab voucher to get patient to [facility] dialysis immediately.    Patient is not happy about this plan as we cannot get him a ride home from dialysis.   EDP apologized for the patient's inconvenience.     Yohan Samons, MD 12/13/16 [phone]

## 2016-12-13 NOTE — ED Notes (Signed)
Pt took 15g kayexalate pta

## 2017-02-26 DIAGNOSIS — I350 Nonrheumatic aortic (valve) stenosis: Secondary | ICD-10-CM | POA: Insufficient documentation

## 2017-03-27 ENCOUNTER — Encounter: Payer: Self-pay | Admitting: Gastroenterology

## 2017-06-19 ENCOUNTER — Ambulatory Visit: Payer: Medicare Other | Admitting: Gastroenterology

## 2017-08-06 ENCOUNTER — Other Ambulatory Visit: Payer: Self-pay

## 2017-08-06 ENCOUNTER — Encounter (HOSPITAL_COMMUNITY): Payer: Self-pay | Admitting: *Deleted

## 2017-08-06 ENCOUNTER — Other Ambulatory Visit: Payer: Self-pay | Admitting: *Deleted

## 2017-08-06 NOTE — Progress Notes (Signed)
Spoke with pt for pre-op call. Pt states that he has a heart murmur, states it has never given him a problem. He states that his nephrologist and his PCP, Dr. Coletta Memos follow him on that. Pt states he is not diabetic. Pt states he will be arriving via [facility] and then taking [facility] to dialysis after surgery. He states he has 2 family members that will be home with him once he gets home.

## 2017-08-07 ENCOUNTER — Inpatient Hospital Stay (HOSPITAL_COMMUNITY): Payer: Medicare Other

## 2017-08-07 ENCOUNTER — Encounter (HOSPITAL_COMMUNITY): Payer: Self-pay

## 2017-08-07 ENCOUNTER — Inpatient Hospital Stay (HOSPITAL_COMMUNITY)
Admission: RE | Admit: 2017-08-07 | Discharge: 2017-08-10 | DRG: 314 | Disposition: A | Discharge status: Home / Self Care | Payer: Medicare Other | Attending: Vascular Surgery | Admitting: Vascular Surgery

## 2017-08-07 ENCOUNTER — Encounter (HOSPITAL_COMMUNITY)
Admission: RE | Disposition: A | Discharge status: Home / Self Care | Payer: Self-pay | Source: Home / Self Care | Attending: Vascular Surgery

## 2017-08-07 ENCOUNTER — Ambulatory Visit (HOSPITAL_COMMUNITY): Payer: Medicare Other | Admitting: Certified Registered"

## 2017-08-07 DIAGNOSIS — Z79899 Other long term (current) drug therapy: Secondary | ICD-10-CM

## 2017-08-07 DIAGNOSIS — I82422 Acute embolism and thrombosis of left iliac vein: Secondary | ICD-10-CM | POA: Diagnosis not present

## 2017-08-07 DIAGNOSIS — Z993 Dependence on wheelchair: Secondary | ICD-10-CM

## 2017-08-07 DIAGNOSIS — N186 End stage renal disease: Secondary | ICD-10-CM | POA: Diagnosis not present

## 2017-08-07 DIAGNOSIS — G40909 Epilepsy, unspecified, not intractable, without status epilepticus: Secondary | ICD-10-CM | POA: Diagnosis present

## 2017-08-07 DIAGNOSIS — E669 Obesity, unspecified: Secondary | ICD-10-CM | POA: Diagnosis present

## 2017-08-07 DIAGNOSIS — I82412 Acute embolism and thrombosis of left femoral vein: Secondary | ICD-10-CM | POA: Diagnosis present

## 2017-08-07 DIAGNOSIS — E039 Hypothyroidism, unspecified: Secondary | ICD-10-CM | POA: Diagnosis present

## 2017-08-07 DIAGNOSIS — N2581 Secondary hyperparathyroidism of renal origin: Secondary | ICD-10-CM | POA: Diagnosis present

## 2017-08-07 DIAGNOSIS — Z885 Allergy status to narcotic agent status: Secondary | ICD-10-CM

## 2017-08-07 DIAGNOSIS — K219 Gastro-esophageal reflux disease without esophagitis: Secondary | ICD-10-CM | POA: Diagnosis present

## 2017-08-07 DIAGNOSIS — Z87442 Personal history of urinary calculi: Secondary | ICD-10-CM | POA: Diagnosis not present

## 2017-08-07 DIAGNOSIS — Z8249 Family history of ischemic heart disease and other diseases of the circulatory system: Secondary | ICD-10-CM

## 2017-08-07 DIAGNOSIS — T82868A Thrombosis of vascular prosthetic devices, implants and grafts, initial encounter: Principal | ICD-10-CM | POA: Diagnosis present

## 2017-08-07 DIAGNOSIS — Z7982 Long term (current) use of aspirin: Secondary | ICD-10-CM | POA: Diagnosis not present

## 2017-08-07 DIAGNOSIS — Z452 Encounter for adjustment and management of vascular access device: Secondary | ICD-10-CM

## 2017-08-07 DIAGNOSIS — Z992 Dependence on renal dialysis: Secondary | ICD-10-CM

## 2017-08-07 DIAGNOSIS — I9589 Other hypotension: Secondary | ICD-10-CM | POA: Diagnosis not present

## 2017-08-07 DIAGNOSIS — E875 Hyperkalemia: Secondary | ICD-10-CM | POA: Diagnosis present

## 2017-08-07 DIAGNOSIS — Z7951 Long term (current) use of inhaled steroids: Secondary | ICD-10-CM

## 2017-08-07 DIAGNOSIS — Z8701 Personal history of pneumonia (recurrent): Secondary | ICD-10-CM

## 2017-08-07 DIAGNOSIS — Z7902 Long term (current) use of antithrombotics/antiplatelets: Secondary | ICD-10-CM | POA: Diagnosis not present

## 2017-08-07 DIAGNOSIS — M898X9 Other specified disorders of bone, unspecified site: Secondary | ICD-10-CM | POA: Diagnosis not present

## 2017-08-07 DIAGNOSIS — Z79890 Hormone replacement therapy (postmenopausal): Secondary | ICD-10-CM | POA: Diagnosis not present

## 2017-08-07 DIAGNOSIS — Q059 Spina bifida, unspecified: Secondary | ICD-10-CM | POA: Diagnosis not present

## 2017-08-07 DIAGNOSIS — I120 Hypertensive chronic kidney disease with stage 5 chronic kidney disease or end stage renal disease: Secondary | ICD-10-CM | POA: Diagnosis not present

## 2017-08-07 DIAGNOSIS — D631 Anemia in chronic kidney disease: Secondary | ICD-10-CM | POA: Diagnosis present

## 2017-08-07 DIAGNOSIS — Z882 Allergy status to sulfonamides status: Secondary | ICD-10-CM

## 2017-08-07 DIAGNOSIS — Z888 Allergy status to other drugs, medicaments and biological substances status: Secondary | ICD-10-CM

## 2017-08-07 DIAGNOSIS — Z91041 Radiographic dye allergy status: Secondary | ICD-10-CM

## 2017-08-07 HISTORY — PX: ULTRASOUND GUIDANCE FOR VASCULAR ACCESS: SHX6516

## 2017-08-07 HISTORY — DX: Cardiac murmur, unspecified: R01.1

## 2017-08-07 HISTORY — DX: Constipation, unspecified: K59.00

## 2017-08-07 HISTORY — PX: INSERTION OF DIALYSIS CATHETER: SHX1324

## 2017-08-07 LAB — POCT I-STAT 4, (NA,K, GLUC, HGB,HCT)
Glucose, Bld: 84 mg/dL (ref 70–99)
HCT: 36 % — ABNORMAL LOW (ref 39.0–52.0)
Hemoglobin: 12.2 g/dL — ABNORMAL LOW (ref 13.0–17.0)
Potassium: 5.4 mmol/L — ABNORMAL HIGH (ref 3.5–5.1)
Sodium: 131 mmol/L — ABNORMAL LOW (ref 135–145)

## 2017-08-07 LAB — SURGICAL PCR SCREEN
MRSA, PCR: NEGATIVE
Staphylococcus aureus: POSITIVE — AB

## 2017-08-07 SURGERY — INSERTION OF DIALYSIS CATHETER
Anesthesia: General | Site: Groin | Laterality: Right

## 2017-08-07 MED ORDER — HEPARIN SODIUM (PORCINE) 1000 UNIT/ML IJ SOLN
INTRAMUSCULAR | Status: DC | PRN
  Administered 2017-08-07: 6000 [IU] via INTRA_ARTERIAL

## 2017-08-07 MED ORDER — MIDODRINE HCL 5 MG PO TABS
10.0000 mg | ORAL_TABLET | ORAL | Status: DC
  Administered 2017-08-10: 10 mg via ORAL
  Filled 2017-08-07 (×2): qty 2

## 2017-08-07 MED ORDER — IODIXANOL 320 MG/ML IV SOLN
INTRAVENOUS | Status: DC | PRN
  Administered 2017-08-07: 100 mL via INTRAVENOUS

## 2017-08-07 MED ORDER — CHLORHEXIDINE GLUCONATE CLOTH 2 % EX PADS
6.0000 | MEDICATED_PAD | Freq: Every day | CUTANEOUS | Status: DC
  Administered 2017-08-10: 6 via TOPICAL

## 2017-08-07 MED ORDER — ASPIRIN 325 MG PO TABS: 325.0000 mg | ORAL_TABLET | Freq: Every day | ORAL | Status: DC

## 2017-08-07 MED ORDER — OXYCODONE-ACETAMINOPHEN 10-325 MG PO TABS: 1.0000 | ORAL_TABLET | ORAL | Status: DC | PRN

## 2017-08-07 MED ORDER — LIDOCAINE-PRILOCAINE 2.5-2.5 % EX CREA
1.0000 "application " | TOPICAL_CREAM | CUTANEOUS | Status: DC | PRN
  Filled 2017-08-07: qty 5

## 2017-08-07 MED ORDER — MIDAZOLAM HCL 5 MG/5ML IJ SOLN
INTRAMUSCULAR | Status: DC | PRN
  Administered 2017-08-07: 2 mg via INTRAVENOUS

## 2017-08-07 MED ORDER — MORPHINE SULFATE (PF) 4 MG/ML IV SOLN
4.0000 mg | INTRAVENOUS | Status: DC | PRN
  Administered 2017-08-07 – 2017-08-10 (×7): 4 mg via INTRAVENOUS
  Filled 2017-08-07 (×7): qty 1

## 2017-08-07 MED ORDER — PROMETHAZINE HCL 25 MG PO TABS
25.0000 mg | ORAL_TABLET | Freq: Two times a day (BID) | ORAL | Status: DC
  Administered 2017-08-07 – 2017-08-10 (×6): 25 mg via ORAL
  Filled 2017-08-07 (×6): qty 1

## 2017-08-07 MED ORDER — MIDAZOLAM HCL 2 MG/2ML IJ SOLN
INTRAMUSCULAR | Status: AC
  Filled 2017-08-07: qty 2

## 2017-08-07 MED ORDER — CLOPIDOGREL BISULFATE 75 MG PO TABS
75.0000 mg | ORAL_TABLET | Freq: Every day | ORAL | Status: DC
  Administered 2017-08-08 – 2017-08-10 (×3): 75 mg via ORAL
  Filled 2017-08-07 (×3): qty 1

## 2017-08-07 MED ORDER — FUROSEMIDE 80 MG PO TABS: 80.0000 mg | ORAL_TABLET | Freq: Every day | ORAL | Status: DC | PRN

## 2017-08-07 MED ORDER — HEPARIN SODIUM (PORCINE) 1000 UNIT/ML IJ SOLN
INTRAMUSCULAR | Status: AC
  Filled 2017-08-07: qty 1

## 2017-08-07 MED ORDER — ZOLPIDEM TARTRATE 5 MG PO TABS
10.0000 mg | ORAL_TABLET | Freq: Every day | ORAL | Status: DC
  Administered 2017-08-07 – 2017-08-09 (×3): 10 mg via ORAL
  Filled 2017-08-07 (×3): qty 2

## 2017-08-07 MED ORDER — 0.9 % SODIUM CHLORIDE (POUR BTL) OPTIME
TOPICAL | Status: DC | PRN
  Administered 2017-08-07: 1000 mL

## 2017-08-07 MED ORDER — CINACALCET HCL 30 MG PO TABS
60.0000 mg | ORAL_TABLET | Freq: Every day | ORAL | Status: DC
  Administered 2017-08-08 – 2017-08-09 (×2): 60 mg via ORAL
  Filled 2017-08-07 (×2): qty 2

## 2017-08-07 MED ORDER — PENTAFLUOROPROP-TETRAFLUOROETH EX AERO: 1.0000 "application " | INHALATION_SPRAY | CUTANEOUS | Status: DC | PRN

## 2017-08-07 MED ORDER — PROMETHAZINE HCL 25 MG/ML IJ SOLN
6.2500 mg | Freq: Once | INTRAMUSCULAR | Status: AC
  Administered 2017-08-07: 6.25 mg via INTRAVENOUS
  Filled 2017-08-07: qty 1

## 2017-08-07 MED ORDER — SODIUM CHLORIDE 0.9 % IV SOLN
INTRAVENOUS | Status: DC
  Administered 2017-08-07 (×2): via INTRAVENOUS

## 2017-08-07 MED ORDER — ZOLPIDEM TARTRATE 10 MG PO TABS: 10.0000 mg | ORAL_TABLET | Freq: Every day | ORAL | Status: DC

## 2017-08-07 MED ORDER — SODIUM CHLORIDE 0.9 % IV SOLN
125.0000 mg | INTRAVENOUS | Status: DC
  Administered 2017-08-08: 125 mg via INTRAVENOUS
  Filled 2017-08-07 (×3): qty 10

## 2017-08-07 MED ORDER — LIDOCAINE HCL (PF) 1 % IJ SOLN: 5.0000 mL | INTRAMUSCULAR | Status: DC | PRN

## 2017-08-07 MED ORDER — PROPOFOL 10 MG/ML IV BOLUS
INTRAVENOUS | Status: AC
  Filled 2017-08-07: qty 20

## 2017-08-07 MED ORDER — TRAZODONE HCL 100 MG PO TABS
100.0000 mg | ORAL_TABLET | Freq: Every day | ORAL | Status: DC
  Administered 2017-08-07 – 2017-08-09 (×3): 100 mg via ORAL
  Filled 2017-08-07 (×3): qty 1

## 2017-08-07 MED ORDER — ONDANSETRON HCL 4 MG/2ML IJ SOLN
INTRAMUSCULAR | Status: DC | PRN
  Administered 2017-08-07: 4 mg via INTRAVENOUS

## 2017-08-07 MED ORDER — ALTEPLASE 2 MG IJ SOLR: 2.0000 mg | Freq: Once | INTRAMUSCULAR | Status: DC | PRN

## 2017-08-07 MED ORDER — LIDOCAINE 2% (20 MG/ML) 5 ML SYRINGE
INTRAMUSCULAR | Status: AC
  Filled 2017-08-07: qty 10

## 2017-08-07 MED ORDER — PHENYLEPHRINE 40 MCG/ML (10ML) SYRINGE FOR IV PUSH (FOR BLOOD PRESSURE SUPPORT)
PREFILLED_SYRINGE | INTRAVENOUS | Status: DC | PRN
  Administered 2017-08-07 (×3): 80 ug via INTRAVENOUS

## 2017-08-07 MED ORDER — HEPARIN SODIUM (PORCINE) 5000 UNIT/ML IJ SOLN
5000.0000 [IU] | Freq: Three times a day (TID) | INTRAMUSCULAR | Status: DC
  Administered 2017-08-07 – 2017-08-10 (×7): 5000 [IU] via SUBCUTANEOUS
  Filled 2017-08-07 (×7): qty 1

## 2017-08-07 MED ORDER — MIDODRINE HCL 5 MG PO TABS
5.0000 mg | ORAL_TABLET | Freq: Three times a day (TID) | ORAL | Status: DC
  Administered 2017-08-08: 5 mg via ORAL
  Filled 2017-08-07 (×9): qty 1

## 2017-08-07 MED ORDER — ONDANSETRON HCL 4 MG/2ML IJ SOLN
INTRAMUSCULAR | Status: AC
  Filled 2017-08-07: qty 2

## 2017-08-07 MED ORDER — DEXAMETHASONE SODIUM PHOSPHATE 10 MG/ML IJ SOLN
INTRAMUSCULAR | Status: AC
  Filled 2017-08-07: qty 1

## 2017-08-07 MED ORDER — FLUTICASONE PROPIONATE 50 MCG/ACT NA SUSP
2.0000 | Freq: Every day | NASAL | Status: DC
  Administered 2017-08-08 – 2017-08-09 (×2): 2 via NASAL
  Filled 2017-08-07: qty 16

## 2017-08-07 MED ORDER — EPHEDRINE 5 MG/ML INJ
INTRAVENOUS | Status: AC
  Filled 2017-08-07: qty 10

## 2017-08-07 MED ORDER — DEXTROSE 5 % IV SOLN
3.0000 g | INTRAVENOUS | Status: AC
  Administered 2017-08-07: 3 g via INTRAVENOUS
  Filled 2017-08-07: qty 3

## 2017-08-07 MED ORDER — OXYCODONE HCL 5 MG PO TABS
5.0000 mg | ORAL_TABLET | ORAL | Status: DC | PRN
  Administered 2017-08-07 – 2017-08-10 (×6): 5 mg via ORAL
  Filled 2017-08-07 (×5): qty 1

## 2017-08-07 MED ORDER — LABETALOL HCL 5 MG/ML IV SOLN: 10.0000 mg | INTRAVENOUS | Status: DC | PRN

## 2017-08-07 MED ORDER — METOPROLOL TARTRATE 5 MG/5ML IV SOLN: 2.0000 mg | INTRAVENOUS | Status: DC | PRN

## 2017-08-07 MED ORDER — DOXERCALCIFEROL 4 MCG/2ML IV SOLN
6.0000 ug | INTRAVENOUS | Status: DC
  Administered 2017-08-10: 6 ug via INTRAVENOUS
  Filled 2017-08-07: qty 4

## 2017-08-07 MED ORDER — FENTANYL CITRATE (PF) 250 MCG/5ML IJ SOLN
INTRAMUSCULAR | Status: AC
  Filled 2017-08-07: qty 5

## 2017-08-07 MED ORDER — LORAZEPAM 0.5 MG PO TABS: 0.5000 mg | ORAL_TABLET | Freq: Every day | ORAL | Status: DC | PRN

## 2017-08-07 MED ORDER — SODIUM CHLORIDE 0.9 % IV SOLN
INTRAVENOUS | Status: AC
  Filled 2017-08-07: qty 1.2

## 2017-08-07 MED ORDER — SODIUM CHLORIDE 0.9 % IV SOLN: 100.0000 mL | INTRAVENOUS | Status: DC | PRN

## 2017-08-07 MED ORDER — HEPARIN SODIUM (PORCINE) 1000 UNIT/ML IJ SOLN
INTRAMUSCULAR | Status: DC | PRN
  Administered 2017-08-07: 12000 [IU] via INTRAVENOUS

## 2017-08-07 MED ORDER — HEPARIN SODIUM (PORCINE) 1000 UNIT/ML DIALYSIS
3700.0000 [IU] | Freq: Once | INTRAMUSCULAR | Status: AC
  Administered 2017-08-08: 3700 [IU] via INTRAVENOUS_CENTRAL
  Filled 2017-08-07: qty 4

## 2017-08-07 MED ORDER — EPHEDRINE SULFATE-NACL 50-0.9 MG/10ML-% IV SOSY
PREFILLED_SYRINGE | INTRAVENOUS | Status: DC | PRN
  Administered 2017-08-07: 15 mg via INTRAVENOUS
  Administered 2017-08-07 (×2): 10 mg via INTRAVENOUS
  Administered 2017-08-07: 5 mg via INTRAVENOUS

## 2017-08-07 MED ORDER — PHENYLEPHRINE 40 MCG/ML (10ML) SYRINGE FOR IV PUSH (FOR BLOOD PRESSURE SUPPORT)
PREFILLED_SYRINGE | INTRAVENOUS | Status: AC
  Filled 2017-08-07: qty 10

## 2017-08-07 MED ORDER — PANTOPRAZOLE SODIUM 40 MG PO TBEC
40.0000 mg | DELAYED_RELEASE_TABLET | Freq: Every day | ORAL | Status: DC
  Administered 2017-08-07 – 2017-08-09 (×3): 40 mg via ORAL
  Filled 2017-08-07 (×3): qty 1

## 2017-08-07 MED ORDER — HEPARIN SODIUM (PORCINE) 1000 UNIT/ML DIALYSIS
1000.0000 [IU] | INTRAMUSCULAR | Status: DC | PRN
  Filled 2017-08-07: qty 1

## 2017-08-07 MED ORDER — CEFAZOLIN SODIUM-DEXTROSE 1-4 GM/50ML-% IV SOLN
1.0000 g | Freq: Once | INTRAVENOUS | Status: AC
  Administered 2017-08-08: 1 g via INTRAVENOUS
  Filled 2017-08-07: qty 50

## 2017-08-07 MED ORDER — SODIUM CHLORIDE 0.9 % IV SOLN
INTRAVENOUS | Status: DC | PRN
  Administered 2017-08-07: 40 ug/min via INTRAVENOUS

## 2017-08-07 MED ORDER — ONDANSETRON HCL 4 MG/2ML IJ SOLN
4.0000 mg | Freq: Four times a day (QID) | INTRAMUSCULAR | Status: DC | PRN
  Administered 2017-08-08 – 2017-08-10 (×2): 4 mg via INTRAVENOUS

## 2017-08-07 MED ORDER — CALCIUM ACETATE (PHOS BINDER) 667 MG PO CAPS
2001.0000 mg | ORAL_CAPSULE | Freq: Three times a day (TID) | ORAL | Status: DC
  Administered 2017-08-08 – 2017-08-10 (×5): 2001 mg via ORAL
  Filled 2017-08-07 (×5): qty 3

## 2017-08-07 MED ORDER — PROPOFOL 10 MG/ML IV BOLUS
INTRAVENOUS | Status: DC | PRN
  Administered 2017-08-07: 50 mg via INTRAVENOUS
  Administered 2017-08-07: 200 mg via INTRAVENOUS

## 2017-08-07 MED ORDER — HYDRALAZINE HCL 20 MG/ML IJ SOLN: 5.0000 mg | INTRAMUSCULAR | Status: DC | PRN

## 2017-08-07 MED ORDER — LIDOCAINE 2% (20 MG/ML) 5 ML SYRINGE
INTRAMUSCULAR | Status: DC | PRN
  Administered 2017-08-07: 75 mg via INTRAVENOUS
  Administered 2017-08-07: 25 mg via INTRAVENOUS

## 2017-08-07 MED ORDER — DEXAMETHASONE SODIUM PHOSPHATE 10 MG/ML IJ SOLN
INTRAMUSCULAR | Status: DC | PRN
  Administered 2017-08-07: 8 mg via INTRAVENOUS

## 2017-08-07 MED ORDER — FAMOTIDINE 20 MG PO TABS
40.0000 mg | ORAL_TABLET | Freq: Every day | ORAL | Status: DC
  Administered 2017-08-08 – 2017-08-10 (×3): 40 mg via ORAL
  Filled 2017-08-07 (×3): qty 2

## 2017-08-07 MED ORDER — OXYCODONE-ACETAMINOPHEN 5-325 MG PO TABS
1.0000 | ORAL_TABLET | ORAL | Status: DC | PRN
  Administered 2017-08-07 – 2017-08-10 (×6): 1 via ORAL
  Filled 2017-08-07 (×5): qty 1

## 2017-08-07 MED ORDER — FENTANYL CITRATE (PF) 100 MCG/2ML IJ SOLN
INTRAMUSCULAR | Status: DC | PRN
  Administered 2017-08-07: 50 ug via INTRAVENOUS

## 2017-08-07 MED ORDER — FENTANYL CITRATE (PF) 100 MCG/2ML IJ SOLN: 25.0000 ug | INTRAMUSCULAR | Status: DC | PRN

## 2017-08-07 MED ORDER — SODIUM CHLORIDE 0.9 % IV SOLN
INTRAVENOUS | Status: DC | PRN
  Administered 2017-08-07: 13:00:00

## 2017-08-07 MED ORDER — LEVOTHYROXINE SODIUM 25 MCG PO TABS
125.0000 ug | ORAL_TABLET | Freq: Every day | ORAL | Status: DC
  Administered 2017-08-08: 125 ug via ORAL
  Filled 2017-08-07: qty 1

## 2017-08-07 SURGICAL SUPPLY — 70 items
ARMBAND PINK RESTRICT EXTREMIT (MISCELLANEOUS) ×8 IMPLANT
BAG BANDED W/RUBBER/TAPE 36X54 (MISCELLANEOUS) ×4 IMPLANT
BAG SNAP BAND KOVER 36X36 (MISCELLANEOUS) ×4 IMPLANT
BALLN MUSTANG 5X60X75 (BALLOONS) ×4
BALLOON MUSTANG 5X60X75 (BALLOONS) ×3 IMPLANT
BIOPATCH RED 1 DISK 7.0 (GAUZE/BANDAGES/DRESSINGS) ×4 IMPLANT
BRUSH SCRUB EZ PLAIN DRY (MISCELLANEOUS) ×12 IMPLANT
CANISTER SUCT 3000ML PPV (MISCELLANEOUS) ×4 IMPLANT
CATH EMB 4FR 80CM (CATHETERS) IMPLANT
CATH PALINDROME RT-P 15FX19CM (CATHETERS) IMPLANT
CATH PALINDROME RT-P 15FX55CM (CATHETERS) ×4 IMPLANT
CLIP VESOCCLUDE MED 6/CT (CLIP) ×4 IMPLANT
CLIP VESOCCLUDE SM WIDE 6/CT (CLIP) ×4 IMPLANT
COVER DOME SNAP 22 D (MISCELLANEOUS) ×4 IMPLANT
COVER PROBE W GEL 5X96 (DRAPES) ×8 IMPLANT
COVER SURGICAL LIGHT HANDLE (MISCELLANEOUS) ×4 IMPLANT
COVER TRANSDUCER ULTRASND GEL (DRAPE) ×4 IMPLANT
DERMABOND ADHESIVE PROPEN (GAUZE/BANDAGES/DRESSINGS) ×1
DERMABOND ADVANCED (GAUZE/BANDAGES/DRESSINGS) ×1
DERMABOND ADVANCED .7 DNX12 (GAUZE/BANDAGES/DRESSINGS) ×3 IMPLANT
DERMABOND ADVANCED .7 DNX6 (GAUZE/BANDAGES/DRESSINGS) ×3 IMPLANT
DEVICE TORQUE KENDALL .025-038 (MISCELLANEOUS) ×8 IMPLANT
DRAPE INCISE IOBAN 66X45 STRL (DRAPES) ×4 IMPLANT
DRAPE ORTHO SPLIT 77X108 STRL (DRAPES) ×1
DRAPE SURG 17X23 STRL (DRAPES) ×4 IMPLANT
DRAPE SURG ORHT 6 SPLT 77X108 (DRAPES) ×3 IMPLANT
DRYSEAL FLEXSHEATH 16FR 33CM (SHEATH) ×2
DRYSEAL FLEXSHEATH 18FR 33CM (SHEATH) ×1
ELECT REM PT RETURN 9FT ADLT (ELECTROSURGICAL) ×4
ELECTRODE REM PT RTRN 9FT ADLT (ELECTROSURGICAL) ×3 IMPLANT
GAUZE SPONGE 4X4 12PLY STRL (GAUZE/BANDAGES/DRESSINGS) ×4 IMPLANT
GLIDEWIRE ADV .035X260CM (WIRE) ×4 IMPLANT
GLOVE BIO SURGEON STRL SZ7.5 (GLOVE) ×4 IMPLANT
GLOVE BIOGEL PI IND STRL 6.5 (GLOVE) ×3 IMPLANT
GLOVE BIOGEL PI INDICATOR 6.5 (GLOVE) ×1
GOWN STRL REUS W/ TWL LRG LVL3 (GOWN DISPOSABLE) ×6 IMPLANT
GOWN STRL REUS W/ TWL XL LVL3 (GOWN DISPOSABLE) ×3 IMPLANT
GOWN STRL REUS W/TWL LRG LVL3 (GOWN DISPOSABLE) ×2
GOWN STRL REUS W/TWL XL LVL3 (GOWN DISPOSABLE) ×1
GUIDEWIRE ANGLED .035X150CM (WIRE) ×8 IMPLANT
GUIDEWIRE SAF TJ AMPL .035X180 (WIRE) ×4 IMPLANT
INSERT FOGARTY SM (MISCELLANEOUS) IMPLANT
KIT BASIN OR (CUSTOM PROCEDURE TRAY) ×4 IMPLANT
KIT ENCORE 26 ADVANTAGE (KITS) ×4 IMPLANT
KIT TURNOVER KIT B (KITS) ×4 IMPLANT
NS IRRIG 1000ML POUR BTL (IV SOLUTION) ×4 IMPLANT
PACK CV ACCESS (CUSTOM PROCEDURE TRAY) ×4 IMPLANT
PACK UNIVERSAL I (CUSTOM PROCEDURE TRAY) ×4 IMPLANT
PAD ARMBOARD 7.5X6 YLW CONV (MISCELLANEOUS) ×8 IMPLANT
PROTECTION STATION PRESSURIZED (MISCELLANEOUS) ×4
SET MICROPUNCTURE 5F STIFF (MISCELLANEOUS) ×4 IMPLANT
SHEATH AVANTI 11CM 5FR (SHEATH) ×4 IMPLANT
SHEATH DRYSEAL FLEX 16FR 33CM (SHEATH) ×6 IMPLANT
SHEATH DRYSEAL FLEX 18FR 33CM (SHEATH) ×3 IMPLANT
SHEATH PINNACLE 6F 10CM (SHEATH) ×4 IMPLANT
SPONGE LAP 18X18 X RAY DECT (DISPOSABLE) ×4 IMPLANT
STATION PROTECTION PRESSURIZED (MISCELLANEOUS) ×3 IMPLANT
SUT ETHILON 3 0 PS 1 (SUTURE) ×4 IMPLANT
SUT GORETEX 6.0 TH-9 30 IN (SUTURE) IMPLANT
SUT GORETEX CV-6TTC-13 36IN (SUTURE) IMPLANT
SUT MNCRL AB 4-0 PS2 18 (SUTURE) IMPLANT
SUT PROLENE 6 0 BV (SUTURE) IMPLANT
SUT VIC AB 3-0 SH 27 (SUTURE)
SUT VIC AB 3-0 SH 27X BRD (SUTURE) IMPLANT
TOWEL GREEN STERILE (TOWEL DISPOSABLE) ×4 IMPLANT
UNDERPAD 30X30 (UNDERPADS AND DIAPERS) ×4 IMPLANT
WATER STERILE IRR 1000ML POUR (IV SOLUTION) ×4 IMPLANT
WIRE BENTSON .035X145CM (WIRE) ×4 IMPLANT
WIRE LUNDERQUIST .035X180CM (WIRE) ×16 IMPLANT
WIRE TORQFLEX AUST .018X40CM (WIRE) ×16 IMPLANT

## 2017-08-07 NOTE — Transfer of Care (Signed)
Immediate Anesthesia Transfer of Care Note  Patient: [name]  Procedure(s) Performed: INSERTION OF DIALYSIS CATHETER (Right Groin) CENTRAL VENOGRAM (Abdomen) ULTRASOUND GUIDANCE FOR VASCULAR CANNULATION RIGHT FEMORAL VEIN AND LEFT AV FEMORAL GRAFT (Right Groin)  Patient Location: PACU  Anesthesia Type:General  Level of Consciousness: awake and patient cooperative  Airway & Oxygen Therapy: Patient Spontanous Breathing and Patient connected to face mask oxygen  Post-op Assessment: Report given to RN, Post -op Vital signs reviewed and stable and Patient moving all extremities X 4  Post vital signs: Reviewed and stable  Last Vitals:  Vitals Value Taken Time  BP 100/58 08/07/2017  5:17 PM  Temp    Pulse 94 08/07/2017  5:18 PM  Resp 20 08/07/2017  5:18 PM  SpO2 94 % 08/07/2017  5:18 PM  Vitals shown include unvalidated device data.  Last Pain:  Vitals:   08/07/17 1046  TempSrc:   PainSc: 0-No pain      Patients Stated Pain Goal: 3 (44/69/50 7225)  Complications: No apparent anesthesia complications

## 2017-08-07 NOTE — Progress Notes (Signed)
Dr Donzetta Matters at bedside / made aware of need to have changed initial drsg d/t oozing/  Observed cdi gauze drsg currently in place. He also commneted on ' his BVP is never co0rrect and is always low so do not be alarmed"

## 2017-08-07 NOTE — Anesthesia Procedure Notes (Signed)
Procedure Name: LMA Insertion Date/Time: 08/07/2017 1:40 PM Performed by: Moshe Salisbury, CRNA Pre-anesthesia Checklist: Patient identified, Emergency Drugs available, Suction available and Patient being monitored Patient Re-evaluated:Patient Re-evaluated prior to induction Oxygen Delivery Method: Circle System Utilized Preoxygenation: Pre-oxygenation with 100% oxygen Induction Type: IV induction Ventilation: Mask ventilation without difficulty LMA: LMA inserted LMA Size: 5.0 Number of attempts: 1 Airway Equipment and Method: Bite block Placement Confirmation: positive ETCO2 Tube secured with: Tape Dental Injury: Teeth and Oropharynx as per pre-operative assessment

## 2017-08-07 NOTE — Progress Notes (Signed)
PHARMACY NOTE:  ANTIMICROBIAL RENAL DOSAGE ADJUSTMENT  Current antimicrobial regimen includes a mismatch between antimicrobial dosage and estimated renal function.  As per policy approved by the Pharmacy & Therapeutics and Medical Executive Committees, the antimicrobial dosage will be adjusted accordingly.  Current antimicrobial dosage:  Cefazolin 1 gm IV q8h x 3 doses  Indication:  Surgical prophylaxis  Renal Function: [x]      On intermittent HD, scheduled:  8/7 []      On CRRT    Antimicrobial dosage has been changed to:    Cefazolin 1 gm IV x 1 after hemodialysis on 8/7  Additional comments:  Cefazolin 3 gm IV given pre-op on 8/6   Thank you for allowing pharmacy to be a part of this patient's care.  Arty Baumgartner, [facility]  Page: [phone] 08/07/2017 8:11 PM

## 2017-08-07 NOTE — Op Note (Signed)
Patient name: [name] MRN: [id number] DOB: 1965/03/31 Sex: male  08/07/2017 Pre-operative Diagnosis: End-stage renal disease, thrombosis left thigh AV graft Post-operative diagnosis:  Same Surgeon:  Eda Paschal. Donzetta Matters, MD Procedure Performed: 1.  Ultrasound-guided cannulation left thigh AV graft 2.  Attempted revascularization left external and common iliac vein 3.  Ultrasound-guided cannulation right greater saphenous vein 4.  Ultrasound-guided cannulation right femoral vein 5.  Placement of right femoral tunneled dialysis catheter through collateral vein into IVC   Indications: 52 year old male on long-term dialysis now on dialysis via left femoral graft and has iliac stents as well as a outflow covered stent.  He denies thrombosis of this graft.  He is indicated for possible thrombectomy versus possible tunnel catheter.  Findings: The graft in the left thigh was thrombosed.  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  I elected to attempt tunneled catheter from the right side.  Through his saphenous vein I could not get into any true luminal access.  From the femoral vein further down the thigh I was able to get into collateral and up into his IVC in place a tunneled catheter to the level of the confluence.  We will test this catheter dialysis tomorrow and if it does not work I believe his only option would be lumbar access.   Procedure:  The patient was identified in the holding area and taken to the operating room where is placed supine on the operating table and general anesthesia was induced.  He was sterilely prepped and draped in the left groin given antibiotics and a timeout was called.  I used ultrasound to identify the actual graft as there were multiple graft in his groin.  I then gave the patient 12,000 units of heparin.  I cannulated this graft laterally heading towards the  medial aspect.  This was with a ultrasound guidance and a micropuncture needle followed by wire and sheath.  I then placed a short 7 [place] sheath.  I used a bare catheter and initially Glidewire to attempt to cross into the external leg but I could not and then I performed a limited angiogram that demonstrated clot in the common femoral vein and no discernible external iliac vein.  I did have a venous perforation.  After multiple attempts I elected that this graft was not salvageable.  We took the drapes down reprepped and draped and another timeout was called to place a tunnel catheter.  Eyes ultrasound guidance identified a large saphenous vein cannulated this directly with micropuncture needle followed by wire sheath.  I then performed venogram which demonstrated occluded external iliac vein with collaterals filling centrally.  I attempted to cross and along the collaterals but could not.  I pulled this catheter and held pressure.  I then turned my attention further down the leg.  Identified the femoral vein cannulated this directly with micropuncture needle followed by wire and sheath under ultrasound guidance again.  I then placed a short 7 [place] sheath.  I used a bare catheter and Glidewire was able to traverse through a collateral medially up into the IVC I then confirmed intraluminal access.  First I placed an Amplatz wire and I brought a 55 cm tunnel catheter to the table placed the introducer sheath.  Attempt to place the catheter through the introducer sheath over wire but could not do it.  I then used a second wire at this  time Lunderquist stiff wire through the catheter but again would not track.  With this I elected that I would need to place a long sheath.  I first used a 16 [place] dry seal sheath.  I placed this up into the IVC under fluoroscopic guidance over a Lunderquist wire.  I then attempted to place the tunneled catheter through the sheath and I guided up to the level of the IVC but it  backing out the sheath would not come over top of the fiber sheath.  I had to cut both in half.  I replaced an Amplatz wire through the catheter and removed both.  I again placed the introducer sheath now that I dilated a tract may be the catheter would go.  Unfortunately this was not the case with a new catheter.  I then placed in long 18 French dry seal sheath with the attempts that this would allow the sheath to be removed over the catheter.  Again I got to the confluence but could not remove it.  Ultimately I cut the fibrin sheath off of the catheter.  I placed the 16 French dry seal sheath up to the level of the IVC placed the catheter to that level remove the wire and then remove the sheath.  Catheter did flush easily.  I then tunneled the catheter laterally and affected to the skin with 3-0 nylon suture.  I assembled the catheter is still flushed and withdrew blood easily.  Satisfied with this we locked it with concentrated heparin in the manufacturer's recommendations.  I closed the groin incision with a 4 Monocryl in place Dermabond to both sides.  He was then allowed away from anesthesia having tolerated procedure without immediate complication.  Next  EBL 1 L.   Adelise Buswell C. Donzetta Matters, MD [facility] Office: [phone] Pager: [phone]

## 2017-08-07 NOTE — Anesthesia Preprocedure Evaluation (Signed)
Anesthesia Evaluation  Patient identified by MRN, date of birth, ID band Patient awake    Reviewed: Allergy & Precautions, NPO status   Airway Mallampati: II  TM Distance: >3 FB     Dental   Pulmonary shortness of breath, pneumonia,    breath sounds clear to auscultation       Cardiovascular hypertension,  Rhythm:Regular Rate:Normal     Neuro/Psych  Headaches,    GI/Hepatic Neg liver ROS, GERD  ,  Endo/Other  Hypothyroidism   Renal/GU Renal disease     Musculoskeletal   Abdominal   Peds  Hematology  (+) anemia ,   Anesthesia Other Findings   Reproductive/Obstetrics                             Anesthesia Physical Anesthesia Plan  ASA: III  Anesthesia Plan: MAC   Post-op Pain Management:    Induction: Intravenous  PONV Risk Score and Plan: 1 and Treatment may vary due to age or medical condition  Airway Management Planned: Simple Face Mask and Nasal Cannula  Additional Equipment:   Intra-op Plan:   Post-operative Plan:   Informed Consent: I have reviewed the patients History and Physical, chart, labs and discussed the procedure including the risks, benefits and alternatives for the proposed anesthesia with the patient or authorized representative who has indicated his/her understanding and acceptance.   Dental advisory given  Plan Discussed with: CRNA, Anesthesiologist and Surgeon  Anesthesia Plan Comments:         Anesthesia Quick Evaluation

## 2017-08-07 NOTE — H&P (Signed)
HP    MRN #:  [id number]  History of Present Illness: This is a 52 y.o. male with esrd. He is on MWF via right thigh avg. Yesterday they could not use graft and was found to be clotted. Now here for declot. Does not have shortness of breath.  Past Medical History:  Diagnosis Date  . Anemia   . Anxiety   . Constipation   . End stage renal failure on dialysis [facility])    "[place]; MWF" (02/29/2016)  . GERD (gastroesophageal reflux disease)   . Grand mal seizure ([place]) X 4   last 1998 (02/29/2016)  . Heart murmur    no problems with per pt. - nephrologist and Dr. Coletta Memos  . History of blood transfusion 2000s   "I was having blood loss; never found out from where"  . History of kidney stones   . Hypertension    hx of - has not taken bp meds in over 2 years  . Hypothyroidism   . Insomnia   . Migraine    "due to my BP in my late 1990s; none since" (02/29/2016)  . Nonhealing surgical wound    of the left arteriovenous graft  . Pneumonia 2000s X 1  . Shortness of breath    rarely  . Spina bifida [facility])     Past Surgical History:  Procedure Laterality Date  . APPENDECTOMY    . ARTERIOVENOUS GRAFT PLACEMENT Left 10/16/2012   left femoral goretex graft         Dr Donnetta Hutching  . AV FISTULA PLACEMENT Left 06/27/2012   Procedure: EXPLORATORY LEFT THY-GRAFT PSEUDO-ANEURYSM;  Surgeon: Conrad Porum, MD;  Location: [place];  Service: Vascular;  Laterality: Left;  Revision of left Arteriovenus gortex graft in thigh.  . COLONOSCOPY    . I&D EXTREMITY Left 02/29/2016   Procedure: DEBRIDEMENT LEFT THIGH WOUND;  Surgeon: Waynetta Sandy, MD;  Location: [place];  Service: Vascular;  Laterality: Left;  . ILEOSTOMY  1970s  . INCISION AND DRAINAGE Left 10/16/2012   Procedure: INCISION AND Debridement left thigh graft;  Surgeon: Rosetta Posner, MD;  Location: [place];  Service: Vascular;  Laterality: Left;  . INSERTION OF DIALYSIS CATHETER    . INSERTION OF DIALYSIS CATHETER  01/14/2016   Procedure:  INSERTION OF DIALYSIS CATHETER;  Surgeon: Waynetta Sandy, MD;  Location: [place];  Service: Vascular;;  . INSERTION OF ILIAC STENT Left 01/14/2016   Procedure: INSERTION OF ILIAC STENT;  Surgeon: Waynetta Sandy, MD;  Location: [place];  Service: Vascular;  Laterality: Left;  . INTRAOPERATIVE ARTERIOGRAM Left 01/14/2016   Procedure: INTRA OPERATIVE ARTERIOGRAM;  Surgeon: Waynetta Sandy, MD;  Location: [place];  Service: Vascular;  Laterality: Left;  . LITHOTRIPSY  x 3   "& a laser treatment" (02/29/2016)  . NEPHROSTOMY Bilateral 1968  . PATELLAR TENDON REPAIR Right 1990s   "big incision"  . PERIPHERAL VASCULAR CATHETERIZATION  01/14/2016   Procedure: A/V SHUNTOGRAM;  Surgeon: Waynetta Sandy, MD;  Location: [place];  Service: Vascular;;  . REVISION OF ARTERIOVENOUS GORETEX GRAFT Left 10/16/2012   Procedure: REVISION OF LEFT FEMORAL LOOP ARTERIOVENOUS GORETEX GRAFT;  Surgeon: Rosetta Posner, MD;  Location: [place];  Service: Vascular;  Laterality: Left;  . REVISION OF ARTERIOVENOUS GORETEX GRAFT Left 02/11/2015   Procedure: EXCISION OF SMALL SEGMENT OF EXPOSED LEFT THIGH NON FUNCTIONING  ARTERIOVENOUS GORETEX GRAFT;  Surgeon: Mal Misty, MD;  Location: [place];  Service: Vascular;  Laterality: Left;  . REVISION OF ARTERIOVENOUS GORETEX GRAFT Left 01/14/2016   Procedure: REVISION OF ARTERIOVENOUS GORETEX GRAFT;  Surgeon: Waynetta Sandy, MD;  Location: [place];  Service: Vascular;  Laterality: Left;  . REVISION OF ARTERIOVENOUS GORETEX GRAFT Left 02/29/2016   thigh/pt report  . REVISION OF ARTERIOVENOUS GORETEX GRAFT Left 02/29/2016   Procedure: POSSIBLE REVISION OF LEFT THIGH ARTERIOVENOUS GORETEX GRAFT;  Surgeon: Waynetta Sandy, MD;  Location: [place];  Service: Vascular;  Laterality: Left;  . THROMBECTOMY W/ EMBOLECTOMY Left 01/14/2016   Procedure: THROMBECTOMY ARTERIOVENOUS GORE-TEX Left thigh GRAFT;  Surgeon: Waynetta Sandy, MD;  Location: [place];   Service: Vascular;  Laterality: Left;  . TONGUE SURGERY  ~ 1990   tongue-tie release   . WOUND DEBRIDEMENT Left 02/29/2016   thigh    Allergies  Allergen Reactions  . Hydrocodone Other (See Comments)    LIKELY NOT REACTION TO HYDROCODONE Pt states that after 3 weeks of taking this medication he will began to "twitch" Pt states that after 3 weeks of taking this medication he will began to "twitch" LIKELY NOT REACTION TO HYDROCODONE Pt states that after 3 weeks of taking this medication he will began to "twitch"  . Furadantin [Nitrofurantoin] Other (See Comments)    UNSPECIFIED REACTION   . Mandelamine [Methenamine] Other (See Comments)    UNSPECIFIED REACTION   . Noroxin [Norfloxacin] Other (See Comments)    UNSPECIFIED REACTION   . [place]ine Nausea Only  . Contrast Media [Iodinated Diagnostic Agents] Nausea And Vomiting    Oral dye causes vomiting, IV dye is okay  . Metrizamide Nausea And Vomiting    Oral dye causes vomiting, IV dye is okay  . Sulfa Antibiotics Cough    Childhood reaction - pt could not confirm that it was a cough  . Sulfur Cough    Childhood reaction - pt could not confirm that it was a cough    Prior to Admission medications   Medication Sig Start Date End Date Taking? Authorizing Provider  calcium acetate (PHOSLO) 667 MG capsule Take 2,001-2,668 mg by mouth See admin instructions. Take 2668 mg by mouth 3 times daily with meals, take 2001 mg by mouth with snacks   Yes [provider]  clopidogrel (PLAVIX) 75 MG tablet Take 1 tablet (75 mg total) by mouth daily. 01/17/16  Yes Alvia Grove, PA-C  famotidine (PEPCID) 40 MG tablet Take 40 mg by mouth daily at 2 PM.    Yes [provider]  fluticasone (FLONASE) 50 MCG/ACT nasal spray Place 2 sprays into both nostrils daily.    Yes [provider]  furosemide (LASIX) 80 MG tablet Take 80 mg by mouth daily as needed (for swelling or shortness of breath).    Yes [provider]    levothyroxine (SYNTHROID, LEVOTHROID) 125 MCG tablet Take 125 mcg by mouth daily at 2 PM.    Yes [provider]  LORazepam (ATIVAN) 0.5 MG tablet Take 0.5 mg by mouth daily as needed for anxiety.    Yes [provider]  midodrine (PROAMATINE) 5 MG tablet Take 1 tablet (5 mg total) by mouth 3 (three) times daily with meals. Patient taking differently: Take 5-10 mg by mouth See admin instructions. Take 5 mg by mouth 3 times daily and take 10 mg by mouth prior to HD on Monday, Wednesday and Friday. 01/18/16  Yes Alvia Grove, PA-C  oxyCODONE-acetaminophen (PERCOCET) 10-325 MG tablet Take 1 tablet by mouth 4 (four) times  daily as needed for pain. 03/01/16  Yes Alvia Grove, PA-C  promethazine (PHENERGAN) 25 MG tablet Take 25 mg by mouth 2 (two) times daily.    Yes [provider]  sodium polystyrene (KAYEXALATE) powder Take 15 g by mouth See admin instructions. Take 15 g (mixed in water) by mouth on Saturdays and Sundays   Yes [provider]  traZODone (DESYREL) 100 MG tablet Take 100 mg by mouth at bedtime.   Yes [provider]  zolpidem (AMBIEN) 10 MG tablet Take 10 mg by mouth at bedtime.    Yes [provider]  aspirin 325 MG tablet Take 325 mg by mouth daily at 2 PM.     [provider]    Social History   Socioeconomic History  . Marital status: Single    Spouse name: Not on file  . Number of children: Not on file  . Years of education: Not on file  . Highest education level: Not on file  Occupational History  . Not on file  Social Needs  . Financial resource strain: Not on file  . Food insecurity:    Worry: Not on file    Inability: Not on file  . Transportation needs:    Medical: Not on file    Non-medical: Not on file  Tobacco Use  . Smoking status: Never Smoker  . Smokeless tobacco: Never Used  Substance and Sexual Activity  . Alcohol use: Not Currently    Alcohol/week: 0.0 oz    Comment: 02/29/2016  "nothing since  the mid 1990s  . Drug use: Not Currently    Types: Marijuana    Comment: 02/29/2016 "nothing since ~ 1995"  . Sexual activity: Never  Lifestyle  . Physical activity:    Days per week: Not on file    Minutes per session: Not on file  . Stress: Not on file  Relationships  . Social connections:    Talks on phone: Not on file    Gets together: Not on file    Attends religious service: Not on file    Active member of club or organization: Not on file    Attends meetings of clubs or organizations: Not on file    Relationship status: Not on file  . Intimate partner violence:    Fear of current or ex partner: Not on file    Emotionally abused: Not on file    Physically abused: Not on file    Forced sexual activity: Not on file  Other Topics Concern  . Not on file  Social History Narrative  . Not on file     Family History  Problem Relation Age of Onset  . Diabetes Mother   . Hypertension Mother   . Heart disease Mother        before age 36  . Diabetes Father   . Heart attack Father        X's 3  . Diabetes Sister   . Bipolar disorder Sister     ROS: negative   Physical Examination  Vitals:   08/07/17 1016  BP: 132/71  Pulse: 68  Resp: 20  Temp: 99 F (37.2 C)  SpO2: 96%   Body mass index is 37.66 kg/m.  General:  WDWN in NAD HENT: WNL, normocephalic Pulmonary: normal non-labored breathing Extremities:left thigh av graft without palpable thrill Musculoskeletal: no muscle wasting or atrophy  Neurologic: A&O X 3; Appropriate Affect ; SENSATION: normal; MOTOR FUNCTION:  moving all extremities equally.  Speech is fluent/normal  CBC    Component Value Date/Time   WBC 6.3 12/13/2016 0437   RBC 4.27 12/13/2016 0437   HGB 12.2 (L) 08/07/2017 1033   HCT 36.0 (L) 08/07/2017 1033   PLT 129 (L) 12/13/2016 0437   MCV 97.9 12/13/2016 0437   MCH 31.4 12/13/2016 0437   MCHC 32.1 12/13/2016 0437   RDW 16.9 (H) 12/13/2016 0437   LYMPHSABS 0.7  12/13/2016 0437   MONOABS 0.3 12/13/2016 0437   EOSABS 0.1 12/13/2016 0437   BASOSABS 0.0 12/13/2016 0437    BMET    Component Value Date/Time   NA 131 (L) 08/07/2017 1033   K 5.4 (H) 08/07/2017 1033   CL 95 (L) 12/13/2016 0503   CO2 22 12/13/2016 0437   GLUCOSE 84 08/07/2017 1033   BUN 78 (H) 12/13/2016 0503   CREATININE 14.00 (H) 12/13/2016 0503   CALCIUM 8.9 12/13/2016 0437   GFRNONAA 4 (L) 12/13/2016 0437   GFRAA 4 (L) 12/13/2016 0437    COAGS: Lab Results  Component Value Date   INR 1.05 01/14/2016   INR 1.1 08/23/2008      ASSESSMENT/PLAN: This is a 52 y.o. male with clotted left thigh av graft. Plan for open declot with venogram and possible intervention. Would need catheter if we cannot salvage graft.   Garik Diamant C. Donzetta Matters, MD [facility] [place] Office: [phone] Pager: [phone]

## 2017-08-07 NOTE — Anesthesia Postprocedure Evaluation (Signed)
Anesthesia Post Note  Patient: [name]  Procedure(s) Performed: INSERTION OF DIALYSIS CATHETER (Right Groin) CENTRAL VENOGRAM (Abdomen) ULTRASOUND GUIDANCE FOR VASCULAR CANNULATION RIGHT FEMORAL VEIN AND LEFT AV FEMORAL GRAFT (Right Groin)     Patient location during evaluation: PACU Anesthesia Type: General Level of consciousness: awake and patient remains intubated per anesthesia plan Pain management: pain level controlled Vital Signs Assessment: post-procedure vital signs reviewed and stable Respiratory status: spontaneous breathing Cardiovascular status: stable Anesthetic complications: no    Last Vitals:  Vitals:   08/07/17 1745 08/07/17 1800  BP:  (!) 93/55  Pulse: 94 83  Resp: 16 19  Temp:  36.6 C  SpO2: 92% 92%    Last Pain:  Vitals:   08/07/17 1800  TempSrc:   PainSc: Asleep                 Nathan Stallworth

## 2017-08-08 ENCOUNTER — Other Ambulatory Visit: Payer: Self-pay

## 2017-08-08 ENCOUNTER — Encounter (HOSPITAL_COMMUNITY): Payer: Self-pay | Admitting: Vascular Surgery

## 2017-08-08 LAB — CBC
HCT: 31.8 % — ABNORMAL LOW (ref 39.0–52.0)
Hemoglobin: 10 g/dL — ABNORMAL LOW (ref 13.0–17.0)
MCH: 30.5 pg (ref 26.0–34.0)
MCHC: 31.4 g/dL (ref 30.0–36.0)
MCV: 97 fL (ref 78.0–100.0)
Platelets: 125 10*3/uL — ABNORMAL LOW (ref 150–400)
RBC: 3.28 MIL/uL — ABNORMAL LOW (ref 4.22–5.81)
RDW: 15 % (ref 11.5–15.5)
WBC: 6.3 10*3/uL (ref 4.0–10.5)

## 2017-08-08 LAB — COMPREHENSIVE METABOLIC PANEL
ALT: 9 U/L (ref 0–44)
AST: 12 U/L — ABNORMAL LOW (ref 15–41)
Albumin: 3.2 g/dL — ABNORMAL LOW (ref 3.5–5.0)
Alkaline Phosphatase: 87 U/L (ref 38–126)
Anion gap: 18 — ABNORMAL HIGH (ref 5–15)
BUN: 80 mg/dL — ABNORMAL HIGH (ref 6–20)
CO2: 25 mmol/L (ref 22–32)
Calcium: 8 mg/dL — ABNORMAL LOW (ref 8.9–10.3)
Chloride: 91 mmol/L — ABNORMAL LOW (ref 98–111)
Creatinine, Ser: 13.75 mg/dL — ABNORMAL HIGH (ref 0.61–1.24)
GFR calc Af Amer: 4 mL/min — ABNORMAL LOW (ref >60)
GFR calc non Af Amer: 4 mL/min — ABNORMAL LOW (ref >60)
Glucose, Bld: 110 mg/dL — ABNORMAL HIGH (ref 70–99)
Potassium: 6.4 mmol/L (ref 3.5–5.1)
Sodium: 134 mmol/L — ABNORMAL LOW (ref 135–145)
Total Bilirubin: 0.8 mg/dL (ref 0.3–1.2)
Total Protein: 7.2 g/dL (ref 6.5–8.1)

## 2017-08-08 LAB — PHOSPHORUS: Phosphorus: 7.6 mg/dL — ABNORMAL HIGH (ref 2.5–4.6)

## 2017-08-08 LAB — HIV ANTIBODY (ROUTINE TESTING W REFLEX): HIV Screen 4th Generation wRfx: NONREACTIVE

## 2017-08-08 MED ORDER — NEPRO/CARBSTEADY PO LIQD
237.0000 mL | Freq: Two times a day (BID) | ORAL | Status: DC
  Administered 2017-08-08 – 2017-08-10 (×4): 237 mL via ORAL

## 2017-08-08 MED ORDER — OXYCODONE-ACETAMINOPHEN 5-325 MG PO TABS
ORAL_TABLET | ORAL | Status: AC
  Filled 2017-08-08: qty 1

## 2017-08-08 MED ORDER — RENA-VITE PO TABS
1.0000 | ORAL_TABLET | Freq: Every day | ORAL | Status: DC
  Administered 2017-08-08 – 2017-08-09 (×2): 1 via ORAL
  Filled 2017-08-08 (×2): qty 1

## 2017-08-08 MED ORDER — MIDODRINE HCL 5 MG PO TABS
ORAL_TABLET | ORAL | Status: AC
  Administered 2017-08-08: 10 mg via ORAL
  Filled 2017-08-08: qty 2

## 2017-08-08 MED ORDER — OXYCODONE HCL 5 MG PO TABS
ORAL_TABLET | ORAL | Status: AC
  Filled 2017-08-08: qty 1

## 2017-08-08 MED ORDER — DOXERCALCIFEROL 4 MCG/2ML IV SOLN
INTRAVENOUS | Status: AC
  Filled 2017-08-08: qty 4

## 2017-08-08 MED ORDER — ONDANSETRON HCL 4 MG/2ML IJ SOLN
INTRAMUSCULAR | Status: AC
  Filled 2017-08-08: qty 2

## 2017-08-08 NOTE — Procedures (Signed)
Pt on HD, no c/o, catheter is working well.  Not actively bleeding at this time.  Dressing is sig reinforced.  Pt is in good spirits.  Anticipate dc home after HD.    I was present at this dialysis session, have reviewed the session itself and made  appropriate changes [name] MD [place] pager [phone]   08/08/2017, 10:59 AM

## 2017-08-08 NOTE — Progress Notes (Signed)
  Progress Note    08/08/2017 12:15 PM 1 Day Post-Op  Subjective: Having some right groin pain.  Vitals:   08/08/17 1130 08/08/17 1149  BP: (!) 85/44 (!) 81/42  Pulse: 65 67  Resp: 14 15  Temp:  98 F (36.7 C)  SpO2: 100% 100%    Physical Exam: Awake alert oriented on dialysis There is hematoma in his right groin not actively expanding The dressing is clean dry and intact Tunneled dialysis catheter currently working for dialysis CBC    Component Value Date/Time   WBC 6.3 08/08/2017 0337   RBC 3.28 (L) 08/08/2017 0337   HGB 10.0 (L) 08/08/2017 0337   HCT 31.8 (L) 08/08/2017 0337   PLT 125 (L) 08/08/2017 0337   MCV 97.0 08/08/2017 0337   MCH 30.5 08/08/2017 0337   MCHC 31.4 08/08/2017 0337   RDW 15.0 08/08/2017 0337   LYMPHSABS 0.7 12/13/2016 0437   MONOABS 0.3 12/13/2016 0437   EOSABS 0.1 12/13/2016 0437   BASOSABS 0.0 12/13/2016 0437    BMET    Component Value Date/Time   NA 134 (L) 08/08/2017 0337   K 6.4 (HH) 08/08/2017 0337   CL 91 (L) 08/08/2017 0337   CO2 25 08/08/2017 0337   GLUCOSE 110 (H) 08/08/2017 0337   BUN 80 (H) 08/08/2017 0337   CREATININE 13.75 (H) 08/08/2017 0337   CALCIUM 8.0 (L) 08/08/2017 0337   GFRNONAA 4 (L) 08/08/2017 0337   GFRAA 4 (L) 08/08/2017 0337    INR    Component Value Date/Time   INR 1.05 01/14/2016 1300     Intake/Output Summary (Last 24 hours) at 08/08/2017 1215 Last data filed at 08/08/2017 1149 Gross per 24 hour  Intake 1026 ml  Output 952 ml  Net 74 ml     Assessment:  52 y.o. male is s/p attempted left femoral loop graft thrombectomy and ultimate placement of a right femoral tunneled dialysis catheter  Plan: Diet as tolerated  I discussed with the patient options moving forward which would be attempted right femoral AV loop graft with a quick stick graft and recanalization of his common and external iliac veins on the right from an open approach which would need to be done in our hybrid room.  I would let  his hematoma resolve first if we can get him by with his catheter.  His only option would be translumbar.  We will hope to get him discharged with this catheter and proceed in the next few weeks with graft option as described above.   Brandon C. Donzetta Matters, MD [facility] [place] Office: [phone] Pager: [phone]  08/08/2017 12:15 PM

## 2017-08-08 NOTE — Consult Note (Addendum)
[facility] Renal Consultation Note    Indication for Consultation:  Management of ESRD/hemodialysis; anemia, hypertension/volume and secondary hyperparathyroidism PCP:  HPI: [name] is a 52 y.o. male with ESRD on hemodialysis MWF at [facility]. PMH  spinal bifida, seizures, migraine HA, AOCD, SHPT, Hyperkalemia, HTN-now Hypotension, obesity, kidney stones, GERD, anxiety. Patient has been on HD since 1997. Uses wheelchair. Last full HD 08/03/17. He is compliant with HD-has had long standing issues with hyperkalemia, uses kayexalate on weekends.   Patient admitted 08/07/2017 with clotted R thigh AVG, seen by Dr. Donzetta Matters. Unfortunately AVG was not amenable to declot procedure and is no salvageable. Fortunately, Dr. Donzetta Matters was able to place R femoral tunneled dialysis catheter. Procedure was complicated by hyperkalemia for which he received kayexalate and is currently being seen on dialysis. He is very anxious and occasionally tearful as AVG was last access available. Says he is not ready to die. Only remaining option is placement of translumbar catheter, however Dr. Donzetta Matters spoke with patient this morning and is going to attempt to place another femoral AVG next week. So far tunneled dialysis catheter is functioning appropriately.   Past Medical History:  Diagnosis Date  . Anemia   . Anxiety   . Constipation   . End stage renal failure on dialysis [facility])    "[place]; MWF" (02/29/2016)  . GERD (gastroesophageal reflux disease)   . Grand mal seizure ([place]) X 4   last 1998 (02/29/2016)  . Heart murmur    no problems with per pt. - nephrologist and Dr. Coletta Memos  . History of blood transfusion 2000s   "I was having blood loss; never found out from where"  . History of kidney stones   . Hypertension    hx of - has not taken bp meds in over 2 years  . Hypothyroidism   . Insomnia   . Migraine    "due to my BP in my late 1990s; none since" (02/29/2016)  .  Nonhealing surgical wound    of the left arteriovenous graft  . Pneumonia 2000s X 1  . Shortness of breath    rarely  . Spina bifida [facility])    Past Surgical History:  Procedure Laterality Date  . APPENDECTOMY    . ARTERIOVENOUS GRAFT PLACEMENT Left 10/16/2012   left femoral goretex graft         Dr Donnetta Hutching  . AV FISTULA PLACEMENT Left 06/27/2012   Procedure: EXPLORATORY LEFT THY-GRAFT PSEUDO-ANEURYSM;  Surgeon: Conrad Nightmute, MD;  Location: [place];  Service: Vascular;  Laterality: Left;  Revision of left Arteriovenus gortex graft in thigh.  . COLONOSCOPY    . I&D EXTREMITY Left 02/29/2016   Procedure: DEBRIDEMENT LEFT THIGH WOUND;  Surgeon: Waynetta Sandy, MD;  Location: [place];  Service: Vascular;  Laterality: Left;  . ILEOSTOMY  1970s  . INCISION AND DRAINAGE Left 10/16/2012   Procedure: INCISION AND Debridement left thigh graft;  Surgeon: Rosetta Posner, MD;  Location: [place];  Service: Vascular;  Laterality: Left;  . INSERTION OF DIALYSIS CATHETER    . INSERTION OF DIALYSIS CATHETER  01/14/2016   Procedure: INSERTION OF DIALYSIS CATHETER;  Surgeon: Waynetta Sandy, MD;  Location: [place];  Service: Vascular;;  . INSERTION OF DIALYSIS CATHETER Right 08/07/2017   Procedure: INSERTION OF DIALYSIS CATHETER;  Surgeon: Waynetta Sandy, MD;  Location: [place];  Service: Vascular;  Laterality: Right;  . INSERTION OF ILIAC STENT Left 01/14/2016   Procedure: INSERTION  OF ILIAC STENT;  Surgeon: Waynetta Sandy, MD;  Location: [place];  Service: Vascular;  Laterality: Left;  . INTRAOPERATIVE ARTERIOGRAM Left 01/14/2016   Procedure: INTRA OPERATIVE ARTERIOGRAM;  Surgeon: Waynetta Sandy, MD;  Location: [place];  Service: Vascular;  Laterality: Left;  . LITHOTRIPSY  x 3   "& a laser treatment" (02/29/2016)  . NEPHROSTOMY Bilateral 1968  . PATELLAR TENDON REPAIR Right 1990s   "big incision"  . PERIPHERAL VASCULAR CATHETERIZATION  01/14/2016   Procedure: A/V SHUNTOGRAM;   Surgeon: Waynetta Sandy, MD;  Location: [place];  Service: Vascular;;  . REVISION OF ARTERIOVENOUS GORETEX GRAFT Left 10/16/2012   Procedure: REVISION OF LEFT FEMORAL LOOP ARTERIOVENOUS GORETEX GRAFT;  Surgeon: Rosetta Posner, MD;  Location: [place];  Service: Vascular;  Laterality: Left;  . REVISION OF ARTERIOVENOUS GORETEX GRAFT Left 02/11/2015   Procedure: EXCISION OF SMALL SEGMENT OF EXPOSED LEFT THIGH NON FUNCTIONING  ARTERIOVENOUS GORETEX GRAFT;  Surgeon: Mal Misty, MD;  Location: [place];  Service: Vascular;  Laterality: Left;  . REVISION OF ARTERIOVENOUS GORETEX GRAFT Left 01/14/2016   Procedure: REVISION OF ARTERIOVENOUS GORETEX GRAFT;  Surgeon: Waynetta Sandy, MD;  Location: [place];  Service: Vascular;  Laterality: Left;  . REVISION OF ARTERIOVENOUS GORETEX GRAFT Left 02/29/2016   thigh/pt report  . REVISION OF ARTERIOVENOUS GORETEX GRAFT Left 02/29/2016   Procedure: POSSIBLE REVISION OF LEFT THIGH ARTERIOVENOUS GORETEX GRAFT;  Surgeon: Waynetta Sandy, MD;  Location: [place];  Service: Vascular;  Laterality: Left;  . THROMBECTOMY W/ EMBOLECTOMY Left 01/14/2016   Procedure: THROMBECTOMY ARTERIOVENOUS GORE-TEX Left thigh GRAFT;  Surgeon: Waynetta Sandy, MD;  Location: [place];  Service: Vascular;  Laterality: Left;  . TONGUE SURGERY  ~ 1990   tongue-tie release   . ULTRASOUND GUIDANCE FOR VASCULAR ACCESS Right 08/07/2017   Procedure: ULTRASOUND GUIDANCE FOR VASCULAR CANNULATION RIGHT FEMORAL VEIN AND LEFT AV FEMORAL GRAFT;  Surgeon: Waynetta Sandy, MD;  Location: [place];  Service: Vascular;  Laterality: Right;  . WOUND DEBRIDEMENT Left 02/29/2016   thigh   Family History  Problem Relation Age of Onset  . Diabetes Mother   . Hypertension Mother   . Heart disease Mother        before age 55  . Diabetes Father   . Heart attack Father        X's 3  . Diabetes Sister   . Bipolar disorder Sister    Social History:  reports that he has never  smoked. He has never used smokeless tobacco. He reports that he drank alcohol. He reports that he has current or past drug history. Drug: Marijuana. Allergies  Allergen Reactions  . Hydrocodone Other (See Comments)    LIKELY NOT REACTION TO HYDROCODONE Pt states that after 3 weeks of taking this medication he will began to "twitch" Pt states that after 3 weeks of taking this medication he will began to "twitch" LIKELY NOT REACTION TO HYDROCODONE Pt states that after 3 weeks of taking this medication he will began to "twitch"  . Furadantin [Nitrofurantoin] Other (See Comments)    UNSPECIFIED REACTION   . Mandelamine [Methenamine] Other (See Comments)    UNSPECIFIED REACTION   . Noroxin [Norfloxacin] Other (See Comments)    UNSPECIFIED REACTION   . Carmine Nausea Only  . Contrast Media [Iodinated Diagnostic Agents] Nausea And Vomiting    Oral dye causes vomiting, IV dye is okay  . Metrizamide Nausea And Vomiting    Oral  dye causes vomiting, IV dye is okay  . Sulfa Antibiotics Cough    Childhood reaction - pt could not confirm that it was a cough  . Sulfur Cough    Childhood reaction - pt could not confirm that it was a cough   Prior to Admission medications   Medication Sig Start Date End Date Taking? Authorizing Provider  calcium acetate (PHOSLO) 667 MG capsule Take 2,001-2,668 mg by mouth See admin instructions. Take 2668 mg by mouth 3 times daily with meals, take 2001 mg by mouth with snacks   Yes [provider]  clopidogrel (PLAVIX) 75 MG tablet Take 1 tablet (75 mg total) by mouth daily. 01/17/16  Yes Alvia Grove, PA-C  famotidine (PEPCID) 40 MG tablet Take 40 mg by mouth daily at 2 PM.    Yes [provider]  fluticasone (FLONASE) 50 MCG/ACT nasal spray Place 2 sprays into both nostrils daily.    Yes [provider]  furosemide (LASIX) 80 MG tablet Take 80 mg by mouth daily as needed (for swelling or shortness of breath).    Yes [provider]  levothyroxine (SYNTHROID, LEVOTHROID) 125 MCG tablet Take 125 mcg by mouth daily at 2 PM.    Yes [provider]  LORazepam (ATIVAN) 0.5 MG tablet Take 0.5 mg by mouth daily as needed for anxiety.    Yes [provider]  midodrine (PROAMATINE) 5 MG tablet Take 1 tablet (5 mg total) by mouth 3 (three) times daily with meals. Patient taking differently: Take 5-10 mg by mouth See admin instructions. Take 5 mg by mouth 3 times daily and take 10 mg by mouth prior to HD on Monday, Wednesday and Friday. 01/18/16  Yes Alvia Grove, PA-C  oxyCODONE-acetaminophen (PERCOCET) 10-325 MG tablet Take 1 tablet by mouth 4 (four) times daily as needed for pain. 03/01/16  Yes Alvia Grove, PA-C  promethazine (PHENERGAN) 25 MG tablet Take 25 mg by mouth 2 (two) times daily.    Yes [provider]  sodium polystyrene (KAYEXALATE) powder Take 15 g by mouth See admin instructions. Take 15 g (mixed in water) by mouth on Saturdays and Sundays   Yes [provider]  traZODone (DESYREL) 100 MG tablet Take 100 mg by mouth at bedtime.   Yes [provider]  zolpidem (AMBIEN) 10 MG tablet Take 10 mg by mouth at bedtime.    Yes [provider]  aspirin 325 MG tablet Take 325 mg by mouth daily at 2 PM.     [provider]   Current Facility-Administered Medications  Medication Dose Route Frequency Provider Last Rate Last Dose  . 0.9 %  sodium chloride infusion  100 mL Intravenous PRN Valentina Gu, NP      . 0.9 %  sodium chloride infusion  100 mL Intravenous PRN Valentina Gu, NP      . alteplase (CATHFLO ACTIVASE) injection 2 mg  2 mg Intracatheter Once PRN Valentina Gu, NP      . calcium acetate (PHOSLO) capsule 2,001 mg  2,001 mg Oral TID WC Valentina Gu, NP      . ceFAZolin (ANCEF) IVPB 1 g/50 mL premix  1 g Intravenous Once Dagoberto Ligas, PA-C      . Chlorhexidine Gluconate Cloth 2 % PADS 6 each  6 each  Topical Q0600 Valentina Gu, NP      . cinacalcet [facility]) tablet 60 mg  60 mg Oral Q supper Juanell Fairly  [place], NP      . clopidogrel (PLAVIX) tablet 75 mg  75 mg Oral Daily Dagoberto Ligas, PA-C      . doxercalciferol (HECTOROL) injection 6 mcg  6 mcg Intravenous Q M,W,F-HD Valentina Gu, NP      . famotidine (PEPCID) tablet 40 mg  40 mg Oral Q1400 Dagoberto Ligas, PA-C      . ferric gluconate (NULECIT) 125 mg in sodium chloride 0.9 % 100 mL IVPB  125 mg Intravenous Q M,W,F-HD Valentina Gu, NP 110 mL/hr at 08/08/17 1026 125 mg at 08/08/17 1026  . fluticasone (FLONASE) 50 MCG/ACT nasal spray 2 spray  2 spray Each Nare Daily Dagoberto Ligas, PA-C      . furosemide (LASIX) tablet 80 mg  80 mg Oral Daily PRN Dagoberto Ligas, PA-C      . heparin injection 1,000 Units  1,000 Units Dialysis PRN Valentina Gu, NP      . heparin injection 5,000 Units  5,000 Units Subcutaneous Q8H Dagoberto Ligas, PA-C   5,000 Units at 08/08/17 [phone]  . hydrALAZINE (APRESOLINE) injection 5 mg  5 mg Intravenous Q20 Min PRN Dagoberto Ligas, PA-C      . labetalol (NORMODYNE,TRANDATE) injection 10 mg  10 mg Intravenous Q10 min PRN Dagoberto Ligas, PA-C      . levothyroxine (SYNTHROID, LEVOTHROID) tablet 125 mcg  125 mcg Oral QAC breakfast Dagoberto Ligas, PA-C   125 mcg at 08/08/17 9528  . lidocaine (PF) (XYLOCAINE) 1 % injection 5 mL  5 mL Intradermal PRN Valentina Gu, NP      . lidocaine-prilocaine (EMLA) cream 1 application  1 application Topical PRN Valentina Gu, NP      . LORazepam (ATIVAN) tablet 0.5 mg  0.5 mg Oral Daily PRN Dagoberto Ligas, PA-C      . metoprolol tartrate (LOPRESSOR) injection 2-5 mg  2-5 mg Intravenous Q2H PRN Dagoberto Ligas, PA-C      . midodrine (PROAMATINE) tablet 10 mg  10 mg Oral Q M,W,F-HD Valentina Gu, NP      . midodrine (PROAMATINE) tablet 5 mg  5 mg Oral TID WC Dagoberto Ligas, PA-C      . morphine 4 MG/ML injection 4 mg  4  mg Intravenous Q2H PRN Dagoberto Ligas, PA-C   4 mg at 08/08/17 0015  . ondansetron (ZOFRAN) injection 4 mg  4 mg Intravenous Q6H PRN Dagoberto Ligas, PA-C   4 mg at 08/08/17 0739  . oxyCODONE-acetaminophen (PERCOCET/ROXICET) 5-325 MG per tablet 1 tablet  1 tablet Oral Q4H PRN Waynetta Sandy, MD   1 tablet at 08/08/17 4132   And  . oxyCODONE (Oxy IR/ROXICODONE) immediate release tablet 5 mg  5 mg Oral Q4H PRN Waynetta Sandy, MD   5 mg at 08/08/17 0358  . pantoprazole (PROTONIX) EC tablet 40 mg  40 mg Oral Daily Dagoberto Ligas, PA-C   40 mg at 08/07/17 2124  . pentafluoroprop-tetrafluoroeth (GEBAUERS) aerosol 1 application  1 application Topical PRN Valentina Gu, NP      . promethazine (PHENERGAN) tablet 25 mg  25 mg Oral BID Dagoberto Ligas, PA-C   25 mg at 08/07/17 2124  . traZODone (DESYREL) tablet 100 mg  100 mg Oral QHS Dagoberto Ligas, PA-C   100 mg at 08/07/17 2124  . zolpidem (AMBIEN) tablet 10 mg  10 mg Oral QHS Waynetta Sandy, MD   10 mg at 08/07/17 2124   Labs: Basic Metabolic Panel: Recent Labs  Lab 08/07/17  1033 08/08/17 0337  NA 131* 134*  K 5.4* 6.4*  CL  --  91*  CO2  --  25  GLUCOSE 84 110*  BUN  --  80*  CREATININE  --  13.75*  CALCIUM  --  8.0*  PHOS  --  7.6*   Liver Function Tests: Recent Labs  Lab 08/08/17 0337  AST 12*  ALT 9  ALKPHOS 87  BILITOT 0.8  PROT 7.2  ALBUMIN 3.2*   No results for input(s): LIPASE, AMYLASE in the last 168 hours. No results for input(s): AMMONIA in the last 168 hours. CBC: Recent Labs  Lab 08/07/17 1033 08/08/17 0337  WBC  --  6.3  HGB 12.2* 10.0*  HCT 36.0* 31.8*  MCV  --  97.0  PLT  --  125*   Cardiac Enzymes: No results for input(s): CKTOTAL, CKMB, CKMBINDEX, TROPONINI in the last 168 hours. CBG: No results for input(s): GLUCAP in the last 168 hours. Iron Studies: No results for input(s): IRON, TIBC, TRANSFERRIN, FERRITIN in the last 72 hours. Studies/Results: Dg  Chest Port 1 View  Result Date: 08/07/2017 CLINICAL DATA:  52 year old with encounter for central line placement. Placement of right femoral dialysis catheter. EXAM: PORTABLE CHEST 1 VIEW COMPARISON:  12/13/2016 FINDINGS: Chronic elevation of the right hemidiaphragm. Right basilar densities are suggestive for atelectasis. Heart size is normal and stable. A central line or dialysis catheter is not clearly identified on these images. Negative for a pneumothorax. IMPRESSION: Central line is not identified. Right basilar atelectasis and chronic elevation of the right hemidiaphragm. Electronically Signed   By: Markus Daft M.D.   On: 08/07/2017 19:43    ROS: As per HPI otherwise negative.   Physical Exam: Vitals:   08/08/17 0900 08/08/17 1000 08/08/17 1030 08/08/17 1100  BP: (!) 90/52 (!) 70/38 (!) 70/31 (!) 74/34  Pulse: 70 65 62 63  Resp: 10 15 14 17   Temp:      TempSrc:      SpO2: 100% 100% 100% 100%  Weight:      Height:         General: Well developed, well nourished, in no acute distress. Head: Normocephalic, atraumatic, sclera non-icteric, mucus membranes are moist Neck: Supple. JVD not elevated. Lungs: Clear bilaterally to auscultation without wheezes, rales, or rhonchi. Breathing is unlabored. Heart: RRR with S1 S2. No murmurs, rubs, or gallops appreciated. Abdomen: Soft, non-tender, non-distended with normoactive bowel sounds. No rebound/guarding. No obvious abdominal masses. M-S:  Strength and tone appear normal for age. Lower extremities:without edema or ischemic changes, no open wounds  Neuro: Alert and oriented X 3. Moves all extremities spontaneously. Psych:  Responds to questions appropriately with a normal affect. Dialysis Access: R fem TDC blood lines connected.   Dialysis Orders: [facility] MWF 4.5 hrs 180 NRe 450/800 124 kg 2.0K/2.25 Ca  R femoral TDC -Heparin 3700 units IV TIW -Parsabiv 50 mg IV TIW (not available at [facility]) -Hectorol 6 mcg IV TIW (last PTH 870  07/25/17) -Venofer 100 mg IV X 10 doses (3/10 doses given, last dose 08/03/17 Tsat 17 Fe 32 07/25/17)  BMD meds: Calcium Acetate 667 mg 3 caps PO TID AC 2 caps with snack  Assessment/Plan: 1.  Failed R Thigh AVG-limited options for access. Now with R Fem TDC. Dr. Donzetta Matters to attempt placement of AVG next week, otherwise will require translumbar TDC. Pt is aware that chance of new AVG is not optimal. Appreciate Dr. Claretha Cooper efforts for this pt.  2. Hyperkalemia-K+6.4 this  AM. Ongoing issue with pt/missed HD 08/06/17. HD today on schedule. 2.0 K bath.  3.  ESRD -  MWF HD today on schedule. Usual heparin.  4.  Hypotension/volume  -On midodrine for chronic hypotension. No excess volume today.  5.  Anemia  -HGB 10.0 Follow HGB. Cont Fe load.  6.  Metabolic bone disease - Phos 7.6 Ca 8.0 Continue binders, VDRA. Parsabiv not available on hospital formulary.  7.  Nutrition - Albumin 3.2 Renal Diet, nepro, renal vit 8. H/O seizure disorder  Disposition: DC home post HD  Rita H. Owens Shark, NP-C 08/08/2017, 11:27 AM  [place] [phone]    Pt seen, examined and agree w A/P as above.  [name] MD [employer] pager [phone]   08/08/2017, 12:49 PM

## 2017-08-09 ENCOUNTER — Encounter (HOSPITAL_COMMUNITY)
Admission: RE | Disposition: A | Discharge status: Home / Self Care | Payer: Self-pay | Source: Home / Self Care | Attending: Vascular Surgery

## 2017-08-09 ENCOUNTER — Encounter (HOSPITAL_COMMUNITY): Payer: Self-pay | Admitting: Surgery

## 2017-08-09 HISTORY — PX: UPPER EXTREMITY VENOGRAPHY: CATH118272

## 2017-08-09 LAB — BASIC METABOLIC PANEL
Anion gap: 15 (ref 5–15)
BUN: 39 mg/dL — ABNORMAL HIGH (ref 6–20)
CO2: 26 mmol/L (ref 22–32)
Calcium: 8.2 mg/dL — ABNORMAL LOW (ref 8.9–10.3)
Chloride: 97 mmol/L — ABNORMAL LOW (ref 98–111)
Creatinine, Ser: 8.59 mg/dL — ABNORMAL HIGH (ref 0.61–1.24)
GFR calc Af Amer: 7 mL/min — ABNORMAL LOW (ref >60)
GFR calc non Af Amer: 6 mL/min — ABNORMAL LOW (ref >60)
Glucose, Bld: 79 mg/dL (ref 70–99)
Potassium: 4.5 mmol/L (ref 3.5–5.1)
Sodium: 138 mmol/L (ref 135–145)

## 2017-08-09 SURGERY — UPPER EXTREMITY VENOGRAPHY
Anesthesia: LOCAL | Laterality: Bilateral

## 2017-08-09 MED ORDER — IODIXANOL 320 MG/ML IV SOLN
INTRAVENOUS | Status: DC | PRN
  Administered 2017-08-09: 30 mL via INTRAVENOUS

## 2017-08-09 SURGICAL SUPPLY — 2 items
STOPCOCK MORSE 400PSI 3WAY (MISCELLANEOUS) ×2 IMPLANT
TUBING CIL FLEX 10 FLL-RA (TUBING) ×2 IMPLANT

## 2017-08-09 NOTE — Interval H&P Note (Signed)
History and Physical Interval Note:  08/09/2017 8:50 AM  [name]  has presented today for surgery, with the diagnosis of pvd  The various methods of treatment have been discussed with the patient and family. After consideration of risks, benefits and other options for treatment, the patient has consented to  Procedure(s): UPPER EXTREMITY VENOGRAPHY (Bilateral) as a surgical intervention .  The patient's history has been reviewed, patient examined, no change in status, stable for surgery.  I have reviewed the patient's chart and labs.  Questions were answered to the patient's satisfaction.     Annamarie Major

## 2017-08-09 NOTE — Progress Notes (Signed)
   I reviewed his venogram with Dr. Trula Slade.  He does not appear to have any upper extremity access.  We will continue with dialysis via his right femoral tunneled catheter we will ensure that this works tomorrow prior to discharge.  After discharge we will plan for right femoral AV loop graft with recanalization of his right external and common iliac veins if possible.  Brandon C. Donzetta Matters, MD [facility] [place] Office: [phone] Pager: [phone]

## 2017-08-09 NOTE — Progress Notes (Addendum)
[facility] Progress Note   Subjective: Subdued today, says he is tired. Wants to stay in hospital overnight which is the plan. C/O pain R femoral area. NAD.    Objective Vitals:   08/09/17 0905 08/09/17 0910 08/09/17 0915 08/09/17 0931  BP: (!) 141/52 136/64  111/62  Pulse: 76 73 (!) 0 73  Resp: 10 15 (!) 0 18  Temp:      TempSrc:      SpO2:  98% (!) 0% 98%  Weight:      Height:       Physical Exam General: WN,WD NAD Heart:S1,S2, RRR Lungs: CTAB A/P Abdomen: Active BS Extremities: No LE edema Dialysis Access: R Femoral TDC Drsg intact   Additional Objective Labs: Basic Metabolic Panel: Recent Labs  Lab 08/07/17 1033 08/08/17 0337  NA 131* 134*  K 5.4* 6.4*  CL  --  91*  CO2  --  25  GLUCOSE 84 110*  BUN  --  80*  CREATININE  --  13.75*  CALCIUM  --  8.0*  PHOS  --  7.6*   Liver Function Tests: Recent Labs  Lab 08/08/17 0337  AST 12*  ALT 9  ALKPHOS 87  BILITOT 0.8  PROT 7.2  ALBUMIN 3.2*   No results for input(s): LIPASE, AMYLASE in the last 168 hours. CBC: Recent Labs  Lab 08/07/17 1033 08/08/17 0337  WBC  --  6.3  HGB 12.2* 10.0*  HCT 36.0* 31.8*  MCV  --  97.0  PLT  --  125*   Blood Culture    Component Value Date/Time   SDES EXPECTORATED SPUTUM 01/18/2016 1356   SPECREQUEST NONE 01/18/2016 1356   CULT  10/16/2012 1027    NO GROWTH 2 DAYS Performed at [place]  10/16/2012 1027    NO ANAEROBES ISOLATED Performed at [place] 01/19/2016 FINAL 01/18/2016 1356    Cardiac Enzymes: No results for input(s): CKTOTAL, CKMB, CKMBINDEX, TROPONINI in the last 168 hours. CBG: No results for input(s): GLUCAP in the last 168 hours. Iron Studies: No results for input(s): IRON, TIBC, TRANSFERRIN, FERRITIN in the last 72 hours. @lablastinr3 @ Studies/Results: Dg Chest Port 1 View  Result Date: 08/07/2017 CLINICAL DATA:  52 year old with encounter for central line placement. Placement  of right femoral dialysis catheter. EXAM: PORTABLE CHEST 1 VIEW COMPARISON:  12/13/2016 FINDINGS: Chronic elevation of the right hemidiaphragm. Right basilar densities are suggestive for atelectasis. Heart size is normal and stable. A central line or dialysis catheter is not clearly identified on these images. Negative for a pneumothorax. IMPRESSION: Central line is not identified. Right basilar atelectasis and chronic elevation of the right hemidiaphragm. Electronically Signed   By: Markus Daft M.D.   On: 08/07/2017 52:43   Medications: . ferric gluconate (FERRLECIT/NULECIT) IV Stopped (08/08/17 1126)   . calcium acetate  2,001 mg Oral TID WC  . Chlorhexidine Gluconate Cloth  6 each Topical Q0600  . cinacalcet  60 mg Oral Q supper  . clopidogrel  75 mg Oral Daily  . doxercalciferol  6 mcg Intravenous Q M,W,F-HD  . famotidine  40 mg Oral Q1400  . feeding supplement (NEPRO CARB STEADY)  237 mL Oral BID BM  . fluticasone  2 spray Each Nare Daily  . heparin  5,000 Units Subcutaneous Q8H  . levothyroxine  125 mcg Oral QAC breakfast  . midodrine  10 mg Oral Q M,W,F-HD  . midodrine  5 mg Oral TID WC  .  multivitamin  1 tablet Oral QHS  . pantoprazole  40 mg Oral Daily  . promethazine  25 mg Oral BID  . traZODone  100 mg Oral QHS  . zolpidem  10 mg Oral QHS     Dialysis Orders: [facility] MWF 4.5 hrs 180 NRe 450/800 124 kg 2.0K/2.25 Ca  R femoral TDC -Heparin 3700 units IV TIW -Parsabiv 50 mg IV TIW (not available at [facility]) -Hectorol 6 mcg IV TIW (last PTH 870 07/25/17) -Venofer 100 mg IV X 10 doses (3/10 doses given, last dose 08/03/17 Tsat 17 Fe 32 07/25/17)  BMD meds: Calcium Acetate 667 mg 3 caps PO TID AC 2 caps with snack  Assessment/Plan: 1.  Failed R Thigh AVG- patient has limited options for HD access remaining. Now with R Fem TDC. Dr. Donzetta Matters to attempt placement of AVG next week, otherwise will require translumbar TDC. Pt is aware that chance of new AVG is not optimal. Appreciate Dr.  Claretha Cooper efforts for this pt. Patient to stay over night and have HD here to ensure that new femoral TDC is functioning properly.   2. Hyperkalemia-K+6.4 this AM. Ongoing issue with pt/missed HD 08/06/17. HD 08/08/17-rechecking K+ today.   3.  ESRD -  MWF HD tomorrow on schedule. Usual heparin. Uses 2.0 K bath.  4.  Hypotension/volume  -On midodrine for chronic hypotension. No excess volume. BP stable.   5.  Anemia  -HGB 10.0 Follow HGB. Cont Fe load.  6.  Metabolic bone disease - Phos 7.6 Ca 8.0 Continue binders, VDRA. Parsabiv not available on hospital formulary.  7.  Nutrition - Albumin 3.2 Renal Diet, nepro, renal vit 8. H/O seizure disorder  Velva Harman H. Brown NP-C 08/09/2017, 11:35 AM  [facility] [phone]  Pt seen, examined and agree w A/P as above.  [name] MD [employer] pager [phone]   08/09/2017, 12:04 PM

## 2017-08-09 NOTE — Op Note (Signed)
    Patient name: Malaki Koury MRN: [id number] DOB: 08/14/1965 Sex: male  08/07/2017 - 08/09/2017 Pre-operative Diagnosis: ESRD Post-operative diagnosis:  Same Surgeon:  Annamarie Major Procedure Performed:  1.  Bilateral upper extremity venogram    Indications: The patient has limited options for dialysis access.  He comes in today to evaluate his central veins.  Procedure:  The patient was identified in the holding area and taken to room 8.  The patient was then placed supine.  Contrast injections were performed through peripheral IVs in each arm.  Findings: The central venous system on the left is occluded.  The central venous system on the right is occluded   Intervention: None  Impression:  #1  Occluded bilateral central venous system    V. Annamarie Major, M.D. [facility] [place] Office: [phone] Pager:  [phone]

## 2017-08-09 NOTE — H&P (View-Only) (Signed)
    Subjective  -   No overnight issues   Physical Exam:  Occluded left thigh AVGG       Assessment/Plan:    Plan for bilateral upper extremity venogram to evaluate for possible HD access sites  Wells Arisbel Maione 08/09/2017 8:49 AM --  Vitals:   08/09/17 0005 08/09/17 0428  BP: (!) 83/41 (!) 103/55  Pulse: 72 66  Resp: 19 17  Temp: 98.5 F (36.9 C) 98.3 F (36.8 C)  SpO2: 98% 100%    Intake/Output Summary (Last 24 hours) at 08/09/2017 0849 Last data filed at 08/08/2017 1149 Gross per 24 hour  Intake -  Output -48 ml  Net 48 ml     Laboratory CBC    Component Value Date/Time   WBC 6.3 08/08/2017 0337   HGB 10.0 (L) 08/08/2017 0337   HCT 31.8 (L) 08/08/2017 0337   PLT 125 (L) 08/08/2017 0337    BMET    Component Value Date/Time   NA 134 (L) 08/08/2017 0337   K 6.4 (HH) 08/08/2017 0337   CL 91 (L) 08/08/2017 0337   CO2 25 08/08/2017 0337   GLUCOSE 110 (H) 08/08/2017 0337   BUN 80 (H) 08/08/2017 0337   CREATININE 13.75 (H) 08/08/2017 0337   CALCIUM 8.0 (L) 08/08/2017 0337   GFRNONAA 4 (L) 08/08/2017 0337   GFRAA 4 (L) 08/08/2017 0337    COAG Lab Results  Component Value Date   INR 1.05 01/14/2016   INR 1.1 08/23/2008   No results found for: PTT  Antibiotics Anti-infectives (From admission, onward)   Start     Dose/Rate Route Frequency Ordered Stop   08/08/17 1200  ceFAZolin (ANCEF) IVPB 1 g/50 mL premix     1 g 100 mL/hr over 30 Minutes Intravenous  Once 08/07/17 1946 08/08/17 1408   08/07/17 1018  ceFAZolin (ANCEF) 3 g in dextrose 5 % 50 mL IVPB     3 g 100 mL/hr over 30 Minutes Intravenous 30 min pre-op 08/07/17 1018 08/07/17 1355       V. Leia Alf, M.D. [facility] [place] Office: [phone] Pager:  [phone]

## 2017-08-09 NOTE — Progress Notes (Signed)
Chaplain presented to the patient's room  For spiritual care consult, however the patient was out of the room for a procedure, Chaplain will follow up at a later time. Dewitt Hoes [phone]

## 2017-08-09 NOTE — Progress Notes (Signed)
    Subjective  -   No overnight issues   Physical Exam:  Occluded left thigh AVGG       Assessment/Plan:    Plan for bilateral upper extremity venogram to evaluate for possible HD access sites  Wells Timesha Cervantez 08/09/2017 8:49 AM --  Vitals:   08/09/17 0005 08/09/17 0428  BP: (!) 83/41 (!) 103/55  Pulse: 72 66  Resp: 19 17  Temp: 98.5 F (36.9 C) 98.3 F (36.8 C)  SpO2: 98% 100%    Intake/Output Summary (Last 24 hours) at 08/09/2017 0849 Last data filed at 08/08/2017 1149 Gross per 24 hour  Intake -  Output -48 ml  Net 48 ml     Laboratory CBC    Component Value Date/Time   WBC 6.3 08/08/2017 0337   HGB 10.0 (L) 08/08/2017 0337   HCT 31.8 (L) 08/08/2017 0337   PLT 125 (L) 08/08/2017 0337    BMET    Component Value Date/Time   NA 134 (L) 08/08/2017 0337   K 6.4 (HH) 08/08/2017 0337   CL 91 (L) 08/08/2017 0337   CO2 25 08/08/2017 0337   GLUCOSE 110 (H) 08/08/2017 0337   BUN 80 (H) 08/08/2017 0337   CREATININE 13.75 (H) 08/08/2017 0337   CALCIUM 8.0 (L) 08/08/2017 0337   GFRNONAA 4 (L) 08/08/2017 0337   GFRAA 4 (L) 08/08/2017 0337    COAG Lab Results  Component Value Date   INR 1.05 01/14/2016   INR 1.1 08/23/2008   No results found for: PTT  Antibiotics Anti-infectives (From admission, onward)   Start     Dose/Rate Route Frequency Ordered Stop   08/08/17 1200  ceFAZolin (ANCEF) IVPB 1 g/50 mL premix     1 g 100 mL/hr over 30 Minutes Intravenous  Once 08/07/17 1946 08/08/17 1408   08/07/17 1018  ceFAZolin (ANCEF) 3 g in dextrose 5 % 50 mL IVPB     3 g 100 mL/hr over 30 Minutes Intravenous 30 min pre-op 08/07/17 1018 08/07/17 1355       V. Leia Alf, M.D. [facility] [place] Office: [phone] Pager:  [phone]

## 2017-08-10 ENCOUNTER — Telehealth: Payer: Self-pay | Admitting: *Deleted

## 2017-08-10 LAB — RENAL FUNCTION PANEL
Albumin: 3 g/dL — ABNORMAL LOW (ref 3.5–5.0)
Anion gap: 14 (ref 5–15)
BUN: 50 mg/dL — ABNORMAL HIGH (ref 6–20)
CO2: 28 mmol/L (ref 22–32)
Calcium: 8.5 mg/dL — ABNORMAL LOW (ref 8.9–10.3)
Chloride: 97 mmol/L — ABNORMAL LOW (ref 98–111)
Creatinine, Ser: 10.34 mg/dL — ABNORMAL HIGH (ref 0.61–1.24)
GFR calc Af Amer: 6 mL/min — ABNORMAL LOW (ref >60)
GFR calc non Af Amer: 5 mL/min — ABNORMAL LOW (ref >60)
Glucose, Bld: 101 mg/dL — ABNORMAL HIGH (ref 70–99)
Phosphorus: 4.8 mg/dL — ABNORMAL HIGH (ref 2.5–4.6)
Potassium: 4.3 mmol/L (ref 3.5–5.1)
Sodium: 139 mmol/L (ref 135–145)

## 2017-08-10 LAB — CBC
HCT: 26.9 % — ABNORMAL LOW (ref 39.0–52.0)
Hemoglobin: 8.3 g/dL — ABNORMAL LOW (ref 13.0–17.0)
MCH: 30.4 pg (ref 26.0–34.0)
MCHC: 30.9 g/dL (ref 30.0–36.0)
MCV: 98.5 fL (ref 78.0–100.0)
Platelets: 106 10*3/uL — ABNORMAL LOW (ref 150–400)
RBC: 2.73 MIL/uL — ABNORMAL LOW (ref 4.22–5.81)
RDW: 15.5 % (ref 11.5–15.5)
WBC: 4.9 10*3/uL (ref 4.0–10.5)

## 2017-08-10 MED ORDER — MIDODRINE HCL 5 MG PO TABS
ORAL_TABLET | ORAL | Status: AC
  Administered 2017-08-10: 10 mg via ORAL
  Filled 2017-08-10: qty 2

## 2017-08-10 MED ORDER — DOXERCALCIFEROL 4 MCG/2ML IV SOLN
INTRAVENOUS | Status: AC
  Administered 2017-08-10: 6 ug via INTRAVENOUS
  Filled 2017-08-10: qty 4

## 2017-08-10 MED ORDER — ONDANSETRON HCL 4 MG/2ML IJ SOLN
INTRAMUSCULAR | Status: AC
  Administered 2017-08-10: 4 mg via INTRAVENOUS
  Filled 2017-08-10: qty 2

## 2017-08-10 NOTE — Progress Notes (Signed)
Pt discharged home with sister. Pt received discharge instructions and all questions were answered. IV and telemetry box removed. Pt left with all of his belongings. Pt discharged via wheelchair and was accompanied by this RN and pt's nurse tech.    Ara Kussmaul BSN, RN

## 2017-08-10 NOTE — Care Management Important Message (Signed)
Important Message  Patient Details  Name: [name] MRN: [id number] Date of Birth: [birth year]   Medicare Important Message Given:  Yes    Kerra Guilfoil P Othmar Ringer 08/10/2017, 3:06 PM

## 2017-08-10 NOTE — Telephone Encounter (Signed)
Sched. Appt. LVM 08/27/17 12pm f/u MD

## 2017-08-10 NOTE — Plan of Care (Signed)
  Problem: Fluid Volume: Goal: Compliance with measures to maintain balanced fluid volume will improve Outcome: Progressing   Problem: Activity: Goal: Risk for activity intolerance will decrease Outcome: Progressing   Problem: Elimination: Goal: Will not experience complications related to bowel motility Outcome: Progressing   Problem: Clinical Measurements: Goal: Postoperative complications will be avoided or minimized Outcome: Not Progressing   Problem: Elimination: Goal: Will not experience complications related to urinary retention Outcome: Not Applicable

## 2017-08-10 NOTE — Care Management Important Message (Signed)
Important Message  Patient Details  Name: [name] MRN: [id number] Date of Birth: May 23, 1965   Medicare Important Message Given:  Yes    Cherylann Hobday P Thalya Fouche 08/10/2017, 3:11 PM

## 2017-08-10 NOTE — Progress Notes (Signed)
[facility] Progress Note   Subjective: on HD, no c/o  Objective Vitals:   08/10/17 0900 08/10/17 0930 08/10/17 1000 08/10/17 1030  BP: (!) 101/50 (!) 99/54 (!) 93/43 (!) 97/43  Pulse: 67 71 61 72  Resp:      Temp:      TempSrc:      SpO2:      Weight:      Height:       Physical Exam General: WN,WD NAD Heart:S1,S2, RRR Lungs: CTAB A/P Abdomen: Active BS Extremities: No LE edema Dialysis Access: R Femoral TDC Drsg intact   Additional Objective Labs: Basic Metabolic Panel: Recent Labs  Lab 08/08/17 0337 08/09/17 1101 08/10/17 0833  NA 134* 138 139  K 6.4* 4.5 4.3  CL 91* 97* 97*  CO2 25 26 28   GLUCOSE 110* 79 101*  BUN 80* 39* 50*  CREATININE 13.75* 8.59* 10.34*  CALCIUM 8.0* 8.2* 8.5*  PHOS 7.6*  --  4.8*   Liver Function Tests: Recent Labs  Lab 08/08/17 0337 08/10/17 0833  AST 12*  --   ALT 9  --   ALKPHOS 87  --   BILITOT 0.8  --   PROT 7.2  --   ALBUMIN 3.2* 3.0*   No results for input(s): LIPASE, AMYLASE in the last 168 hours. CBC: Recent Labs  Lab 08/07/17 1033 08/08/17 0337 08/10/17 0833  WBC  --  6.3 4.9  HGB 12.2* 10.0* 8.3*  HCT 36.0* 31.8* 26.9*  MCV  --  97.0 98.5  PLT  --  125* 106*   Blood Culture    Component Value Date/Time   SDES EXPECTORATED SPUTUM 01/18/2016 1356   SPECREQUEST NONE 01/18/2016 1356   CULT  10/16/2012 1027    NO GROWTH 2 DAYS Performed at [facility]  10/16/2012 1027    NO ANAEROBES ISOLATED Performed at [place] 01/19/2016 FINAL 01/18/2016 1356    Cardiac Enzymes: No results for input(s): CKTOTAL, CKMB, CKMBINDEX, TROPONINI in the last 168 hours. CBG: No results for input(s): GLUCAP in the last 168 hours. Iron Studies: No results for input(s): IRON, TIBC, TRANSFERRIN, FERRITIN in the last 72 hours. @lablastinr3 @ Studies/Results: No results found. Medications: . ferric gluconate (FERRLECIT/NULECIT) IV Stopped (08/08/17 1126)   .  calcium acetate  2,001 mg Oral TID WC  . Chlorhexidine Gluconate Cloth  6 each Topical Q0600  . cinacalcet  60 mg Oral Q supper  . clopidogrel  75 mg Oral Daily  . doxercalciferol  6 mcg Intravenous Q M,W,F-HD  . famotidine  40 mg Oral Q1400  . feeding supplement (NEPRO CARB STEADY)  237 mL Oral BID BM  . fluticasone  2 spray Each Nare Daily  . heparin  5,000 Units Subcutaneous Q8H  . levothyroxine  125 mcg Oral QAC breakfast  . midodrine  10 mg Oral Q M,W,F-HD  . midodrine  5 mg Oral TID WC  . multivitamin  1 tablet Oral QHS  . pantoprazole  40 mg Oral Daily  . promethazine  25 mg Oral BID  . traZODone  100 mg Oral QHS  . zolpidem  10 mg Oral QHS     Dialysis Orders: [facility] MWF 4.5 hrs 180 NRe 450/800 124 kg 2.0K/2.25 Ca  R femoral TDC -Heparin 3700 units IV TIW -Parsabiv 50 mg IV TIW (not available at [facility]) -Hectorol 6 mcg IV TIW (last PTH 870 07/25/17) -Venofer 100 mg IV X 10 doses (3/10 doses given,  last dose 08/03/17 Tsat 17 Fe 32 07/25/17)  BMD meds: Calcium Acetate 667 mg 3 caps PO TID AC 2 caps with snack  Assessment/Plan: 1.  Failed R Thigh AVG- patient has limited options for HD access remaining. Has new R femoral TDC.  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  VVS considering possible attempt at new R thigh AVG as outpatient.  Greatly appreciate VVS assistance.  2. Hyperkalemia - chronic issue w/ this patient who takes kayexalate at home on the weekends.  3. ESRD -  MWF HD.  HD today in progress, [facility] working well.  4.  Hypotension/volume  -On midodrine for chronic hypotension. No excess volume. BP stable.   5.  Anemia  -HGB 10.0 Follow HGB. Cont Fe load.  6.  Metabolic bone disease - Phos 7.6 Ca 8.0 Continue binders, VDRA. Parsabiv not available on hospital formulary.  7.  Nutrition - Albumin 3.2 Renal Diet, nepro, renal vit 8. H/O seizure disorder 9.   Dispo - prob dc home today after HD    [name]  MD [employer] pager [phone]   08/10/2017, 10:48 AM

## 2017-08-10 NOTE — Progress Notes (Signed)
  Progress Note    08/10/2017 8:47 AM 1 Day Post-Op  Subjective: He is scared about the future of his dialysis access  Vitals:   08/10/17 0027 08/10/17 0445  BP: (!) 110/57 (!) 100/41  Pulse: 66 67  Resp: 12 15  Temp: 98.3 F (36.8 C) 98 F (36.7 C)  SpO2: 96% 98%    Physical Exam: Awake alert oriented Right groin with hematoma is resolving TDC is in place  CBC    Component Value Date/Time   WBC 6.3 08/08/2017 0337   RBC 3.28 (L) 08/08/2017 0337   HGB 10.0 (L) 08/08/2017 0337   HCT 31.8 (L) 08/08/2017 0337   PLT 125 (L) 08/08/2017 0337   MCV 97.0 08/08/2017 0337   MCH 30.5 08/08/2017 0337   MCHC 31.4 08/08/2017 0337   RDW 15.0 08/08/2017 0337   LYMPHSABS 0.7 12/13/2016 0437   MONOABS 0.3 12/13/2016 0437   EOSABS 0.1 12/13/2016 0437   BASOSABS 0.0 12/13/2016 0437    BMET    Component Value Date/Time   NA 138 08/09/2017 1101   K 4.5 08/09/2017 1101   CL 97 (L) 08/09/2017 1101   CO2 26 08/09/2017 1101   GLUCOSE 79 08/09/2017 1101   BUN 39 (H) 08/09/2017 1101   CREATININE 8.59 (H) 08/09/2017 1101   CALCIUM 8.2 (L) 08/09/2017 1101   GFRNONAA 6 (L) 08/09/2017 1101   GFRAA 7 (L) 08/09/2017 1101    INR    Component Value Date/Time   INR 1.05 01/14/2016 1300     Intake/Output Summary (Last 24 hours) at 08/10/2017 0847 Last data filed at 08/10/2017 0504 Gross per 24 hour  Intake 290 ml  Output -  Net 290 ml     Assessment:  52 y.o. male is s/p tunneled dialysis catheter placement through a collateral vein in his right groin.  He does have a mild hematoma there.  Plan: Dialysis today via tunneled catheter I discussed with him the options moving forward and he is wondering why he cannot have a bypass.  He had a venogram yesterday demonstrates no upper extremity veins for use.  His options from my standpoint would be translumbar as a last resort but I am willing to try an open AV graft of his right groin trying to cross his occluded right external iliac  vein.  May have to place a hero graft at that time to maintain central access if we can get there.  He demonstrates good understanding.  If dialysis works today we will discharge him and I will follow him up in short order in the office.   Shahrzad Koble C. Donzetta Matters, MD [facility] [place] Office: [phone] Pager: [phone]  08/10/2017 8:47 AM

## 2017-08-14 ENCOUNTER — Other Ambulatory Visit: Payer: Self-pay

## 2017-08-14 ENCOUNTER — Emergency Department (HOSPITAL_COMMUNITY)
Admission: EM | Admit: 2017-08-14 | Discharge: 2017-08-14 | Disposition: A | Discharge status: Home / Self Care | Payer: Medicare Other | Attending: Emergency Medicine | Admitting: Emergency Medicine

## 2017-08-14 ENCOUNTER — Encounter (HOSPITAL_COMMUNITY): Payer: Self-pay | Admitting: *Deleted

## 2017-08-14 DIAGNOSIS — Z992 Dependence on renal dialysis: Secondary | ICD-10-CM | POA: Insufficient documentation

## 2017-08-14 DIAGNOSIS — E039 Hypothyroidism, unspecified: Secondary | ICD-10-CM | POA: Insufficient documentation

## 2017-08-14 DIAGNOSIS — Z5189 Encounter for other specified aftercare: Secondary | ICD-10-CM | POA: Insufficient documentation

## 2017-08-14 DIAGNOSIS — I120 Hypertensive chronic kidney disease with stage 5 chronic kidney disease or end stage renal disease: Secondary | ICD-10-CM | POA: Insufficient documentation

## 2017-08-14 DIAGNOSIS — T82838A Hemorrhage of vascular prosthetic devices, implants and grafts, initial encounter: Secondary | ICD-10-CM | POA: Insufficient documentation

## 2017-08-14 DIAGNOSIS — Z79899 Other long term (current) drug therapy: Secondary | ICD-10-CM | POA: Diagnosis not present

## 2017-08-14 DIAGNOSIS — N186 End stage renal disease: Secondary | ICD-10-CM | POA: Diagnosis not present

## 2017-08-14 DIAGNOSIS — Z7982 Long term (current) use of aspirin: Secondary | ICD-10-CM | POA: Insufficient documentation

## 2017-08-14 DIAGNOSIS — Y69 Unspecified misadventure during surgical and medical care: Secondary | ICD-10-CM | POA: Diagnosis not present

## 2017-08-14 DIAGNOSIS — Z7902 Long term (current) use of antithrombotics/antiplatelets: Secondary | ICD-10-CM | POA: Insufficient documentation

## 2017-08-14 LAB — CBC WITH DIFFERENTIAL/PLATELET
Abs Immature Granulocytes: 0 10*3/uL (ref 0.0–0.1)
Basophils Absolute: 0 10*3/uL (ref 0.0–0.1)
Basophils Relative: 0 %
Eosinophils Absolute: 0.3 10*3/uL (ref 0.0–0.7)
Eosinophils Relative: 4 %
HCT: 28.9 % — ABNORMAL LOW (ref 39.0–52.0)
Hemoglobin: 8.9 g/dL — ABNORMAL LOW (ref 13.0–17.0)
Immature Granulocytes: 1 %
Lymphocytes Relative: 8 %
Lymphs Abs: 0.5 10*3/uL — ABNORMAL LOW (ref 0.7–4.0)
MCH: 30.4 pg (ref 26.0–34.0)
MCHC: 30.8 g/dL (ref 30.0–36.0)
MCV: 98.6 fL (ref 78.0–100.0)
Monocytes Absolute: 0.6 10*3/uL (ref 0.1–1.0)
Monocytes Relative: 10 %
Neutro Abs: 5 10*3/uL (ref 1.7–7.7)
Neutrophils Relative %: 77 %
Platelets: 135 10*3/uL — ABNORMAL LOW (ref 150–400)
RBC: 2.93 MIL/uL — ABNORMAL LOW (ref 4.22–5.81)
RDW: 16.1 % — ABNORMAL HIGH (ref 11.5–15.5)
WBC: 6.5 10*3/uL (ref 4.0–10.5)

## 2017-08-14 MED ORDER — "THROMBI-PAD 3""X3"" EX PADS"
1.0000 | MEDICATED_PAD | Freq: Once | CUTANEOUS | Status: AC
  Administered 2017-08-14: 1 via TOPICAL
  Filled 2017-08-14: qty 1

## 2017-08-14 NOTE — ED Notes (Signed)
Pt is not able to reach any of his family members at home.  He's made aware that if [facility] transport him back home, they will not wait until someone wakes up to open the door.

## 2017-08-14 NOTE — ED Notes (Signed)
Pt is sitting on the side of the stretcher to eat and drink.

## 2017-08-14 NOTE — ED Notes (Signed)
Pt reports his fem cath site is still bleeding.  Small amount of blood noted.  Pt states his vascular surgeon needs to be notified re the bleeding.  [place] EDP made aware

## 2017-08-14 NOTE — ED Notes (Signed)
[facility] called for transport.  

## 2017-08-14 NOTE — ED Triage Notes (Signed)
Per EMS, pt from home, pt has a R femoral cath for dialysis, went today.  Pt has hx of spina Bifida, is able to ambulate but sometimes crawls at home to get around.  Tonight, he crawled to the kitchen, when he got there he noticed his R fem cath was bleeding.  He's has failed shunts in his arms bilaterally and his L thigh. He is A&Ox 4.  Denies any complaints.

## 2017-08-14 NOTE — Discharge Summary (Signed)
Discharge Summary    [name] [birth year] 52 y.o. male  [id number]  Admission Date: 08/07/2017  Discharge Date: 08/10/17  Physician: No att. providers found  Admission Diagnosis: ESRD (end stage renal disease) on dialysis ([place]) [N18.6, Z99.2]   HPI:   This is a 52 y.o. male with esrd. He is on MWF via right thigh avg. Yesterday they could not use graft and was found to be clotted. Now here for declot. Does not have shortness of breath.  Hospital Course:  The patient was admitted to the hospital and taken to the operating room on 08/09/2017 and underwent: 1.  Ultrasound-guided cannulation left thigh AV graft 2.  Attempted revascularization left external and common iliac vein 3.  Ultrasound-guided cannulation right greater saphenous vein 4.  Ultrasound-guided cannulation right femoral vein 5.  Placement of right femoral tunneled dialysis catheter through collateral vein into IVC    Findings: The graft in the left thigh was thrombosed.  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  I elected to attempt tunneled catheter from the right side.  Through his saphenous vein I could not get into any true luminal access.  From the femoral vein further down the thigh I was able to get into collateral and up into his IVC in place a tunneled catheter to the level of the confluence.  We will test this catheter dialysis tomorrow and if it does not work I believe his only option would be lumbar access.  The pt tolerated the procedure well and was transported to the PACU in good condition.   On POD 1, Dr. Donzetta Matters d/w the pt options moving forward which would be attempted right femoral AV loop graft with a quick stick graft and recanalization of his common and external iliac veins on the right from an open approach which would need to be done in our hybrid room.  I would let his hematoma resolve  first if we can get him by with his catheter.  His only option would be translumbar.  We will hope to get him discharged with this catheter and proceed in the next few weeks with graft option as described above.  On 08/09/17, he was taken to the [facility] lab and underwent bilateral upper extremity venogram with findings that the central venous system on the right and left is occluded.   Dr. Donzetta Matters reviewed his venogram with Dr. Trula Slade.  He does not appear to have any upper extremity access.  We will continue with dialysis via his right femoral tunneled catheter we will ensure that this works tomorrow prior to discharge.  After discharge we will plan for right femoral AV loop graft with recanalization of his right external and common iliac veins if possible.  On 08/10/17: Dr. Donzetta Matters discussed with him the options moving forward and he is wondering why he cannot have a bypass.  He had a venogram yesterday demonstrates no upper extremity veins for use.  His options from my standpoint would be translumbar as a last resort but I am willing to try an open AV graft of his right groin trying to cross his occluded right external iliac vein.  May have to place a hero graft at that time to maintain central access if we can get there.  He demonstrates good understanding.  If dialysis works today we will discharge him and I will follow him up in short order in the office.  It worked well and he was discharged home.  The remainder of the hospital course consisted of increasing mobilization and increasing intake of solids without difficulty.  CBC    Component Value Date/Time   WBC 6.5 08/14/2017 0111   RBC 2.93 (L) 08/14/2017 0111   HGB 8.9 (L) 08/14/2017 0111   HCT 28.9 (L) 08/14/2017 0111   PLT 135 (L) 08/14/2017 0111   MCV 98.6 08/14/2017 0111   MCH 30.4 08/14/2017 0111   MCHC 30.8 08/14/2017 0111   RDW 16.1 (H) 08/14/2017 0111   LYMPHSABS 0.5 (L) 08/14/2017 0111   MONOABS 0.6 08/14/2017 0111   EOSABS 0.3  08/14/2017 0111   BASOSABS 0.0 08/14/2017 0111    BMET    Component Value Date/Time   NA 139 08/10/2017 0833   K 4.3 08/10/2017 0833   CL 97 (L) 08/10/2017 0833   CO2 28 08/10/2017 0833   GLUCOSE 101 (H) 08/10/2017 0833   BUN 50 (H) 08/10/2017 0833   CREATININE 10.34 (H) 08/10/2017 0833   CALCIUM 8.5 (L) 08/10/2017 0833   GFRNONAA 5 (L) 08/10/2017 0833   GFRAA 6 (L) 08/10/2017 0833      Discharge Instructions    Discharge patient   Complete by:  As directed    Discharge disposition:  01-Home or Self Care   Discharge patient date:  08/10/2017      Discharge Diagnosis:  ESRD (end stage renal disease) on dialysis ([place]) [N18.6, Z99.2]  Secondary Diagnosis: Patient Active Problem List   Diagnosis Date Noted  . ESRD (end stage renal disease) on dialysis ([place]) 08/07/2017  . Nonhealing surgical wound 02/29/2016  . Clotted dialysis access ([place]) 01/14/2016  . Removal of staples 07/19/2012  . Other complications due to renal dialysis device, implant, and graft 06/27/2012  . End stage renal disease ([place]) 06/27/2012   Past Medical History:  Diagnosis Date  . Anemia   . Anxiety   . Constipation   . End stage renal failure on dialysis [facility])    "[place]; MWF" (02/29/2016)  . GERD (gastroesophageal reflux disease)   . Grand mal seizure ([place]) X 4   last 1998 (02/29/2016)  . Heart murmur    no problems with per pt. - nephrologist and Dr. Coletta Memos  . History of blood transfusion 2000s   "I was having blood loss; never found out from where"  . History of kidney stones   . Hypertension    hx of - has not taken bp meds in over 2 years  . Hypothyroidism   . Insomnia   . Migraine    "due to my BP in my late 1990s; none since" (02/29/2016)  . Nonhealing surgical wound    of the left arteriovenous graft  . Pneumonia 2000s X 1  . Shortness of breath    rarely  . Spina bifida ([place])      Allergies as of 08/10/2017      Reactions   Hydrocodone Other (See Comments)   LIKELY NOT  REACTION TO HYDROCODONE Pt states that after 3 weeks of taking this medication he will began to "twitch" Pt states that after 3 weeks of taking this medication he will began to "twitch" LIKELY NOT REACTION TO HYDROCODONE Pt states that after 3 weeks of taking this medication he will began to "twitch"   Furadantin [nitrofurantoin] Other (See Comments)   UNSPECIFIED REACTION    Mandelamine [methenamine] Other (See Comments)   UNSPECIFIED REACTION    Noroxin [norfloxacin] Other (See Comments)  UNSPECIFIED REACTION    [place]ine Nausea Only   Contrast Media [iodinated Diagnostic Agents] Nausea And Vomiting   Oral dye causes vomiting, IV dye is okay   Metrizamide Nausea And Vomiting   Oral dye causes vomiting, IV dye is okay   Sulfa Antibiotics Cough   Childhood reaction - pt could not confirm that it was a cough   Sulfur Cough   Childhood reaction - pt could not confirm that it was a cough      Medication List    TAKE these medications   aspirin 325 MG tablet Take 325 mg by mouth daily at 2 PM.   calcium acetate 667 MG capsule Commonly known as:  PHOSLO Take 2,001-2,668 mg by mouth See admin instructions. Take 2668 mg by mouth 3 times daily with meals, take 2001 mg by mouth with snacks   clopidogrel 75 MG tablet Commonly known as:  PLAVIX Take 1 tablet (75 mg total) by mouth daily.   famotidine 40 MG tablet Commonly known as:  PEPCID Take 40 mg by mouth daily at 2 PM.   fluticasone 50 MCG/ACT nasal spray Commonly known as:  FLONASE Place 2 sprays into both nostrils daily.   furosemide 80 MG tablet Commonly known as:  LASIX Take 80 mg by mouth daily as needed (for swelling or shortness of breath).   levothyroxine 125 MCG tablet Commonly known as:  SYNTHROID, LEVOTHROID Take 125 mcg by mouth daily at 2 PM.   LORazepam 0.5 MG tablet Commonly known as:  ATIVAN Take 0.5 mg by mouth daily as needed for anxiety.   midodrine 5 MG tablet Commonly known as:  PROAMATINE Take  1 tablet (5 mg total) by mouth 3 (three) times daily with meals. What changed:    how much to take  when to take this  additional instructions   oxyCODONE-acetaminophen 10-325 MG tablet Commonly known as:  PERCOCET Take 1 tablet by mouth 4 (four) times daily as needed for pain.   promethazine 25 MG tablet Commonly known as:  PHENERGAN Take 25 mg by mouth 2 (two) times daily.   sodium polystyrene powder Commonly known as:  KAYEXALATE Take 15 g by mouth See admin instructions. Take 15 g (mixed in water) by mouth on Saturdays and Sundays   traZODone 100 MG tablet Commonly known as:  DESYREL Take 100 mg by mouth at bedtime.   zolpidem 10 MG tablet Commonly known as:  AMBIEN Take 10 mg by mouth at bedtime.       Prescriptions given: none  Disposition: home  Patient's condition: is Good  Follow up: 1. Dr. Donzetta Matters on 08/27/17   Leontine Locket, PA-C [facility] [phone] 08/14/2017  11:56 AM

## 2017-08-14 NOTE — ED Provider Notes (Signed)
[facility] EMERGENCY DEPARTMENT Provider Note   CSN: [id number] Arrival date & time: 08/14/17  0013     History   Chief Complaint Chief Complaint  Patient presents with  . Bleeding from dialysis site    HPI [name] is a 52 y.o. male.  The history is provided by the patient.  Wound Check  This is a new problem. The current episode started 1 to 2 hours ago. The problem occurs constantly. The problem has been rapidly improving. Pertinent negatives include no chest pain, no abdominal pain, no headaches and no shortness of breath. Nothing aggravates the symptoms. Nothing relieves the symptoms. He has tried nothing for the symptoms. The treatment provided significant relief.  Had dialysis catheter placed on 08/07/17 and tonight developed bleeding at this skin.  Tissue placed.  Bleeding had stopped PTA.  No drainage.  No f/c/r.  Reports he took one additional dose of aspirin and plavix yesterday by accident.    Past Medical History:  Diagnosis Date  . Anemia   . Anxiety   . Constipation   . End stage renal failure on dialysis [facility])    "[place]; MWF" (02/29/2016)  . GERD (gastroesophageal reflux disease)   . Grand mal seizure ([place]) X 4   last 1998 (02/29/2016)  . Heart murmur    no problems with per pt. - nephrologist and Dr. Coletta Memos  . History of blood transfusion 2000s   "I was having blood loss; never found out from where"  . History of kidney stones   . Hypertension    hx of - has not taken bp meds in over 2 years  . Hypothyroidism   . Insomnia   . Migraine    "due to my BP in my late 1990s; none since" (02/29/2016)  . Nonhealing surgical wound    of the left arteriovenous graft  . Pneumonia 2000s X 1  . Shortness of breath    rarely  . Spina bifida [facility])     Patient Active Problem List   Diagnosis Date Noted  . ESRD (end stage renal disease) on dialysis ([place]) 08/07/2017  . Nonhealing surgical wound 02/29/2016  . Clotted dialysis access  ([place]) 01/14/2016  . Removal of staples 07/19/2012  . Other complications due to renal dialysis device, implant, and graft 06/27/2012  . End stage renal disease ([place]) 06/27/2012    Past Surgical History:  Procedure Laterality Date  . APPENDECTOMY    . ARTERIOVENOUS GRAFT PLACEMENT Left 10/16/2012   left femoral goretex graft         Dr Donnetta Hutching  . AV FISTULA PLACEMENT Left 06/27/2012   Procedure: EXPLORATORY LEFT THY-GRAFT PSEUDO-ANEURYSM;  Surgeon: Conrad Icard, MD;  Location: [place];  Service: Vascular;  Laterality: Left;  Revision of left Arteriovenus gortex graft in thigh.  . COLONOSCOPY    . I&D EXTREMITY Left 02/29/2016   Procedure: DEBRIDEMENT LEFT THIGH WOUND;  Surgeon: Waynetta Sandy, MD;  Location: [place];  Service: Vascular;  Laterality: Left;  . ILEOSTOMY  1970s  . INCISION AND DRAINAGE Left 10/16/2012   Procedure: INCISION AND Debridement left thigh graft;  Surgeon: Rosetta Posner, MD;  Location: [place];  Service: Vascular;  Laterality: Left;  . INSERTION OF DIALYSIS CATHETER    . INSERTION OF DIALYSIS CATHETER  01/14/2016   Procedure: INSERTION OF DIALYSIS CATHETER;  Surgeon: Waynetta Sandy, MD;  Location: [place];  Service: Vascular;;  . INSERTION OF DIALYSIS CATHETER Right 08/07/2017  Procedure: INSERTION OF DIALYSIS CATHETER;  Surgeon: Waynetta Sandy, MD;  Location: [place];  Service: Vascular;  Laterality: Right;  . INSERTION OF ILIAC STENT Left 01/14/2016   Procedure: INSERTION OF ILIAC STENT;  Surgeon: Waynetta Sandy, MD;  Location: [place];  Service: Vascular;  Laterality: Left;  . INTRAOPERATIVE ARTERIOGRAM Left 01/14/2016   Procedure: INTRA OPERATIVE ARTERIOGRAM;  Surgeon: Waynetta Sandy, MD;  Location: [place];  Service: Vascular;  Laterality: Left;  . LITHOTRIPSY  x 3   "& a laser treatment" (02/29/2016)  . NEPHROSTOMY Bilateral 1968  . PATELLAR TENDON REPAIR Right 1990s   "big incision"  . PERIPHERAL VASCULAR CATHETERIZATION   01/14/2016   Procedure: A/V SHUNTOGRAM;  Surgeon: Waynetta Sandy, MD;  Location: [place];  Service: Vascular;;  . REVISION OF ARTERIOVENOUS GORETEX GRAFT Left 10/16/2012   Procedure: REVISION OF LEFT FEMORAL LOOP ARTERIOVENOUS GORETEX GRAFT;  Surgeon: Rosetta Posner, MD;  Location: [place];  Service: Vascular;  Laterality: Left;  . REVISION OF ARTERIOVENOUS GORETEX GRAFT Left 02/11/2015   Procedure: EXCISION OF SMALL SEGMENT OF EXPOSED LEFT THIGH NON FUNCTIONING  ARTERIOVENOUS GORETEX GRAFT;  Surgeon: Mal Misty, MD;  Location: [place];  Service: Vascular;  Laterality: Left;  . REVISION OF ARTERIOVENOUS GORETEX GRAFT Left 01/14/2016   Procedure: REVISION OF ARTERIOVENOUS GORETEX GRAFT;  Surgeon: Waynetta Sandy, MD;  Location: [place];  Service: Vascular;  Laterality: Left;  . REVISION OF ARTERIOVENOUS GORETEX GRAFT Left 02/29/2016   thigh/pt report  . REVISION OF ARTERIOVENOUS GORETEX GRAFT Left 02/29/2016   Procedure: POSSIBLE REVISION OF LEFT THIGH ARTERIOVENOUS GORETEX GRAFT;  Surgeon: Waynetta Sandy, MD;  Location: [place];  Service: Vascular;  Laterality: Left;  . THROMBECTOMY W/ EMBOLECTOMY Left 01/14/2016   Procedure: THROMBECTOMY ARTERIOVENOUS GORE-TEX Left thigh GRAFT;  Surgeon: Waynetta Sandy, MD;  Location: [place];  Service: Vascular;  Laterality: Left;  . TONGUE SURGERY  ~ 1990   tongue-tie release   . ULTRASOUND GUIDANCE FOR VASCULAR ACCESS Right 08/07/2017   Procedure: ULTRASOUND GUIDANCE FOR VASCULAR CANNULATION RIGHT FEMORAL VEIN AND LEFT AV FEMORAL GRAFT;  Surgeon: Waynetta Sandy, MD;  Location: [place];  Service: Vascular;  Laterality: Right;  . UPPER EXTREMITY VENOGRAPHY Bilateral 08/09/2017   Procedure: UPPER EXTREMITY VENOGRAPHY;  Surgeon: Serafina Mitchell, MD;  Location: [place] CV LAB;  Service: Cardiovascular;  Laterality: Bilateral;  . WOUND DEBRIDEMENT Left 02/29/2016   thigh        Home Medications    Prior to Admission  medications   Medication Sig Start Date End Date Taking? Authorizing Provider  aspirin 325 MG tablet Take 325 mg by mouth daily at 2 PM.    Yes [provider]  calcium acetate (PHOSLO) 667 MG capsule Take 2,001-2,668 mg by mouth See admin instructions. Take 2668 mg by mouth 3 times daily with meals, take 2001 mg by mouth with snacks   Yes [provider]  clopidogrel (PLAVIX) 75 MG tablet Take 1 tablet (75 mg total) by mouth daily. 01/17/16  Yes Alvia Grove, PA-C  famotidine (PEPCID) 40 MG tablet Take 40 mg by mouth daily at 2 PM.    Yes [provider]  fluticasone (FLONASE) 50 MCG/ACT nasal spray Place 2 sprays into both nostrils daily.    Yes [provider]  furosemide (LASIX) 80 MG tablet Take 80 mg by mouth daily as needed (for swelling or shortness of breath).    Yes [provider]  levothyroxine (SYNTHROID, LEVOTHROID) 125 MCG tablet Take 125 mcg by mouth daily at 2 PM.    Yes [provider]  LORazepam (ATIVAN) 0.5 MG tablet Take 0.5 mg by mouth daily as needed for anxiety.    Yes [provider]  midodrine (PROAMATINE) 5 MG tablet Take 1 tablet (5 mg total) by mouth 3 (three) times daily with meals. Patient taking differently: Take 5-10 mg by mouth See admin instructions. Take 5 mg by mouth 3 times daily and take 10 mg by mouth prior to HD on Monday, Wednesday and Friday. 01/18/16  Yes Alvia Grove, PA-C  oxyCODONE-acetaminophen (PERCOCET) 10-325 MG tablet Take 1 tablet by mouth 4 (four) times daily as needed for pain. 03/01/16  Yes Alvia Grove, PA-C  promethazine (PHENERGAN) 25 MG tablet Take 25 mg by mouth 2 (two) times daily.    Yes [provider]  sodium polystyrene (KAYEXALATE) powder Take 15 g by mouth See admin instructions. Take 15 g (mixed in water) by mouth on Saturdays and Sundays   Yes [provider]  traZODone (DESYREL) 100 MG tablet Take 100 mg by mouth at bedtime.   Yes [provider]  zolpidem (AMBIEN) 10 MG tablet Take 10 mg by mouth at bedtime.    Yes [provider]    Family History Family History  Problem Relation Age of Onset  . Diabetes Mother   . Hypertension Mother   . Heart disease Mother        before age 31  . Diabetes Father   . Heart attack Father        X's 3  . Diabetes Sister   . Bipolar disorder Sister     Social History Social History   Tobacco Use  . Smoking status: Never Smoker  . Smokeless tobacco: Never Used  Substance Use Topics  . Alcohol use: Not Currently    Alcohol/week: 0.0 standard drinks    Comment: 02/29/2016 "nothing since  the mid 1990s  . Drug use: Not Currently    Types: Marijuana    Comment: 02/29/2016 "nothing since ~ 1995"     Allergies   Hydrocodone; Furadantin [nitrofurantoin]; Mandelamine [methenamine]; Noroxin [norfloxacin]; Carmine; Contrast media [iodinated diagnostic agents]; Metrizamide; Sulfa antibiotics; and Sulfur   Review of Systems Review of Systems  Constitutional: Negative for fever.  Respiratory: Negative for shortness of breath.   Cardiovascular: Negative for chest pain.  Gastrointestinal: Negative for abdominal pain.  Skin: Positive for wound. Negative for color change.  Neurological: Negative for headaches.  All other systems reviewed and are negative.    Physical Exam Updated Vital Signs BP (!) 96/58 (BP Location: Right Arm)   Pulse 76   Temp 98.7 F (37.1 C) (Oral)   Resp 18   Ht 5\' 11"  (1.803 m)   Wt 124.4 kg   SpO2 99%   BMI 38.25 kg/m   Physical Exam  Constitutional: He is oriented to person, place, and time. He appears well-developed and well-nourished. No distress.  HENT:  Head: Normocephalic and atraumatic.  Mouth/Throat: No oropharyngeal exudate.  Eyes: Pupils are equal, round, and reactive to light. Conjunctivae are normal.  Neck: Normal range of motion. Neck supple.  Cardiovascular: Normal rate, regular rhythm, normal heart sounds and  intact distal pulses.  Pulmonary/Chest: Effort normal and breath sounds normal. No stridor. He has no wheezes. He has no rales.  Abdominal: Soft. Bowel sounds are normal. He exhibits no mass. There is no tenderness. There is no rebound  and no guarding.  Musculoskeletal: Normal range of motion.  Neurological: He is alert and oriented to person, place, and time.  Skin: Skin is warm and dry. Capillary refill takes less than 2 seconds. No erythema. No pallor.     Psychiatric: He has a normal mood and affect.     ED Treatments / Results  Labs (all labs ordered are listed, but only abnormal results are displayed) Labs Reviewed  CBC WITH DIFFERENTIAL/PLATELET - Abnormal; Notable for the following components:      Result Value   RBC 2.93 (*)    Hemoglobin 8.9 (*)    HCT 28.9 (*)    RDW 16.1 (*)    Platelets 135 (*)    Lymphs Abs 0.5 (*)    All other components within normal limits    EKG None  Radiology No results found.  Procedures Procedures (including critical care time)   Po challenged successfully.    No further bleeding.  One additional dose of each medication is not toxic and the patient is neither SI nor HI.  He is instructed to call his vascular surgeon in the am.  Patient refused any pressure on the site due to his clotting issues.   Final Clinical Impressions(s) / ED Diagnoses   Final diagnoses:  Visit for wound check    Return for numbness, changes in vision or speech, fevers >100.4 unrelieved by medication, shortness of breath, intractable vomiting, or diarrhea, abdominal pain, Inability to tolerate liquids or food, cough, altered mental status or any concerns. No signs of systemic illness or infection. The patient is nontoxic-appearing on exam and vital signs are within normal limits. Will refer to urology for microscopy hematuria as patient is asymptomatic.  I have reviewed the triage vital signs and the nursing notes. Pertinent labs &imaging results  that were available during my care of the patient were reviewed by me and considered in my medical decision making (see chart for details).  After history, exam, and medical workup I feel the patient has been appropriately medically screened and is safe for discharge home. Pertinent diagnoses were discussed with the patient. Patient was given return precautions.   Nupur Hohman, MD 08/14/17 [phone]

## 2017-08-27 ENCOUNTER — Encounter: Payer: Self-pay | Admitting: Vascular Surgery

## 2017-08-27 ENCOUNTER — Ambulatory Visit (INDEPENDENT_AMBULATORY_CARE_PROVIDER_SITE_OTHER): Payer: Medicare Other | Admitting: Vascular Surgery

## 2017-08-27 ENCOUNTER — Encounter: Payer: Self-pay | Admitting: *Deleted

## 2017-08-27 ENCOUNTER — Other Ambulatory Visit: Payer: Self-pay

## 2017-08-27 VITALS — BP 107/66 | HR 63 | Temp 98.5°F | Resp 18 | Ht 71.0 in | Wt 273.6 lb

## 2017-08-27 DIAGNOSIS — Z992 Dependence on renal dialysis: Secondary | ICD-10-CM | POA: Diagnosis not present

## 2017-08-27 DIAGNOSIS — N186 End stage renal disease: Secondary | ICD-10-CM | POA: Diagnosis not present

## 2017-08-27 NOTE — Progress Notes (Signed)
Patient ID: [name], male   DOB: 09/07/1965, 52 y.o.   MRN: [id number]  Reason for Consult: Follow-up   Referred by [name], MD  Subjective:     HPI:  [name] is a 52 y.o. male with end-stage renal disease on dialysis Monday Wednesday Friday.  He has had multiple accesses in his bilateral upper extremities in [place] and his surgeries in [place] have included multiple procedures on his left thigh AV graft.  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  He substernally had a tunneled dialysis catheter placed through a collateral vein in his right groin.  He had hematoma from that and the catheter was working for dialysis.  States that he is been able to get flow volumes of at least 300 on dialysis.  Hematoma in his groin has resolved.  States that he is feeling depression facing his mortality.  He is otherwise been medically okay since discharge.  Past Medical History:  Diagnosis Date  . Anemia   . Anxiety   . Constipation   . End stage renal failure on dialysis [facility])    "[place]; MWF" (02/29/2016)  . GERD (gastroesophageal reflux disease)   . Grand mal seizure ([place]) X 4   last 1998 (02/29/2016)  . Heart murmur    no problems with per pt. - nephrologist and Dr. Coletta Memos  . History of blood transfusion 2000s   "I was having blood loss; never found out from where"  . History of kidney stones   . Hypertension    hx of - has not taken bp meds in over 2 years  . Hypothyroidism   . Insomnia   . Migraine    "due to my BP in my late 1990s; none since" (02/29/2016)  . Nonhealing surgical wound    of the left arteriovenous graft  . Pneumonia 2000s X 1  . Shortness of breath    rarely  . Spina bifida ([place])    Family History  Problem Relation Age of Onset  . Diabetes Mother   . Hypertension Mother   . Heart disease  Mother        before age 45  . Diabetes Father   . Heart attack Father        X's 3  . Diabetes Sister   . Bipolar disorder Sister    Past Surgical History:  Procedure Laterality Date  . APPENDECTOMY    . ARTERIOVENOUS GRAFT PLACEMENT Left 10/16/2012   left femoral goretex graft         Dr Donnetta Hutching  . AV FISTULA PLACEMENT Left 06/27/2012   Procedure: EXPLORATORY LEFT THY-GRAFT PSEUDO-ANEURYSM;  Surgeon: Conrad Dassel, MD;  Location: [place];  Service: Vascular;  Laterality: Left;  Revision of left Arteriovenus gortex graft in thigh.  . COLONOSCOPY    . I&D EXTREMITY Left 02/29/2016   Procedure: DEBRIDEMENT LEFT THIGH WOUND;  Surgeon: Waynetta Sandy, MD;  Location: [place];  Service: Vascular;  Laterality: Left;  . ILEOSTOMY  1970s  . INCISION AND DRAINAGE Left 10/16/2012   Procedure: INCISION AND Debridement left thigh graft;  Surgeon: Rosetta Posner, MD;  Location: [place];  Service: Vascular;  Laterality: Left;  . INSERTION OF DIALYSIS CATHETER    . INSERTION OF DIALYSIS CATHETER  01/14/2016   Procedure: INSERTION OF DIALYSIS CATHETER;  Surgeon:  Waynetta Sandy, MD;  Location: [place];  Service: Vascular;;  . INSERTION OF DIALYSIS CATHETER Right 08/07/2017   Procedure: INSERTION OF DIALYSIS CATHETER;  Surgeon: Waynetta Sandy, MD;  Location: [place];  Service: Vascular;  Laterality: Right;  . INSERTION OF ILIAC STENT Left 01/14/2016   Procedure: INSERTION OF ILIAC STENT;  Surgeon: Waynetta Sandy, MD;  Location: [place];  Service: Vascular;  Laterality: Left;  . INTRAOPERATIVE ARTERIOGRAM Left 01/14/2016   Procedure: INTRA OPERATIVE ARTERIOGRAM;  Surgeon: Waynetta Sandy, MD;  Location: [employer];  Service: Vascular;  Laterality: Left;  . LITHOTRIPSY  x 3   "& a laser treatment" (02/29/2016)  . NEPHROSTOMY Bilateral 1968  . PATELLAR TENDON REPAIR Right 1990s   "big incision"  . PERIPHERAL VASCULAR CATHETERIZATION  01/14/2016   Procedure: A/V SHUNTOGRAM;  Surgeon:  Waynetta Sandy, MD;  Location: [place];  Service: Vascular;;  . REVISION OF ARTERIOVENOUS GORETEX GRAFT Left 10/16/2012   Procedure: REVISION OF LEFT FEMORAL LOOP ARTERIOVENOUS GORETEX GRAFT;  Surgeon: Rosetta Posner, MD;  Location: [place];  Service: Vascular;  Laterality: Left;  . REVISION OF ARTERIOVENOUS GORETEX GRAFT Left 02/11/2015   Procedure: EXCISION OF SMALL SEGMENT OF EXPOSED LEFT THIGH NON FUNCTIONING  ARTERIOVENOUS GORETEX GRAFT;  Surgeon: Mal Misty, MD;  Location: [place];  Service: Vascular;  Laterality: Left;  . REVISION OF ARTERIOVENOUS GORETEX GRAFT Left 01/14/2016   Procedure: REVISION OF ARTERIOVENOUS GORETEX GRAFT;  Surgeon: Waynetta Sandy, MD;  Location: [place];  Service: Vascular;  Laterality: Left;  . REVISION OF ARTERIOVENOUS GORETEX GRAFT Left 02/29/2016   thigh/pt report  . REVISION OF ARTERIOVENOUS GORETEX GRAFT Left 02/29/2016   Procedure: POSSIBLE REVISION OF LEFT THIGH ARTERIOVENOUS GORETEX GRAFT;  Surgeon: Waynetta Sandy, MD;  Location: [place];  Service: Vascular;  Laterality: Left;  . THROMBECTOMY W/ EMBOLECTOMY Left 01/14/2016   Procedure: THROMBECTOMY ARTERIOVENOUS GORE-TEX Left thigh GRAFT;  Surgeon: Waynetta Sandy, MD;  Location: [place];  Service: Vascular;  Laterality: Left;  . TONGUE SURGERY  ~ 1990   tongue-tie release   . ULTRASOUND GUIDANCE FOR VASCULAR ACCESS Right 08/07/2017   Procedure: ULTRASOUND GUIDANCE FOR VASCULAR CANNULATION RIGHT FEMORAL VEIN AND LEFT AV FEMORAL GRAFT;  Surgeon: Waynetta Sandy, MD;  Location: [place];  Service: Vascular;  Laterality: Right;  . UPPER EXTREMITY VENOGRAPHY Bilateral 08/09/2017   Procedure: UPPER EXTREMITY VENOGRAPHY;  Surgeon: Serafina Mitchell, MD;  Location: [place] [facility];  Service: Cardiovascular;  Laterality: Bilateral;  . WOUND DEBRIDEMENT Left 02/29/2016   thigh    Short Social History:  Social History   Tobacco Use  . Smoking status: Never Smoker  . Smokeless  tobacco: Never Used  Substance Use Topics  . Alcohol use: Not Currently    Alcohol/week: 0.0 standard drinks    Comment: 02/29/2016 "nothing since  the mid 1990s    Allergies  Allergen Reactions  . Hydrocodone Other (See Comments)    LIKELY NOT REACTION TO HYDROCODONE Pt states that after 3 weeks of taking this medication he will began to "twitch" Pt states that after 3 weeks of taking this medication he will began to "twitch" LIKELY NOT REACTION TO HYDROCODONE Pt states that after 3 weeks of taking this medication he will began to "twitch"  . Furadantin [Nitrofurantoin] Other (See Comments)    UNSPECIFIED REACTION   . Mandelamine [Methenamine] Other (See Comments)    UNSPECIFIED REACTION   . Noroxin [Norfloxacin] Other (See Comments)  UNSPECIFIED REACTION   . Carmine Nausea Only  . Contrast Media [Iodinated Diagnostic Agents] Nausea And Vomiting    Oral dye causes vomiting, IV dye is okay  . Metrizamide Nausea And Vomiting    Oral dye causes vomiting, IV dye is okay  . Sulfa Antibiotics Cough    Childhood reaction - pt could not confirm that it was a cough  . Sulfur Cough    Childhood reaction - pt could not confirm that it was a cough    Current Outpatient Medications  Medication Sig Dispense Refill  . aspirin 325 MG tablet Take 325 mg by mouth daily at 2 PM.     . calcium acetate (PHOSLO) 667 MG capsule Take 2,001-2,668 mg by mouth See admin instructions. Take 2668 mg by mouth 3 times daily with meals, take 2001 mg by mouth with snacks    . clopidogrel (PLAVIX) 75 MG tablet Take 1 tablet (75 mg total) by mouth daily. 30 tablet 3  . famotidine (PEPCID) 40 MG tablet Take 40 mg by mouth daily at 2 PM.     . fluticasone (FLONASE) 50 MCG/ACT nasal spray Place 2 sprays into both nostrils daily.     . furosemide (LASIX) 80 MG tablet Take 80 mg by mouth daily as needed (for swelling or shortness of breath).     Marland Kitchen levothyroxine (SYNTHROID, LEVOTHROID) 125 MCG tablet Take 125 mcg by  mouth daily at 2 PM.     . LORazepam (ATIVAN) 0.5 MG tablet Take 0.5 mg by mouth daily as needed for anxiety.     . midodrine (PROAMATINE) 5 MG tablet Take 1 tablet (5 mg total) by mouth 3 (three) times daily with meals. (Patient taking differently: Take 5-10 mg by mouth See admin instructions. Take 5 mg by mouth 3 times daily and take 10 mg by mouth prior to HD on Monday, Wednesday and Friday.) 90 tablet 6  . oxyCODONE-acetaminophen (PERCOCET) 10-325 MG tablet Take 1 tablet by mouth 4 (four) times daily as needed for pain. 30 tablet 0  . promethazine (PHENERGAN) 25 MG tablet Take 25 mg by mouth 2 (two) times daily.     . sodium polystyrene (KAYEXALATE) powder Take 15 g by mouth See admin instructions. Take 15 g (mixed in water) by mouth on Saturdays and Sundays    . traZODone (DESYREL) 100 MG tablet Take 100 mg by mouth at bedtime.    Marland Kitchen zolpidem (AMBIEN) 10 MG tablet Take 10 mg by mouth at bedtime.      No current facility-administered medications for this visit.     Review of Systems  Constitutional:  Constitutional negative. Cardiovascular: Cardiovascular negative.  Psychiatric: Positive for depressed mood.        Objective:  Objective   Vitals:   08/27/17 1235  BP: 107/66  Pulse: 63  Resp: 18  Temp: 98.5 F (36.9 C)  TempSrc: Oral  SpO2: 100%  Weight: 273 lb 9.5 oz (124.1 kg)  Height: 5\' 11"  (1.803 m)   Body mass index is 38.16 kg/m.  Physical Exam  Constitutional: He is oriented to person, place, and time. He appears well-developed.  Cardiovascular: Normal rate.  Pulses:      Femoral pulses are 2+ on the right side. Abdominal: Soft.  Musculoskeletal:  Right groin with minimal hematoma, tunnel catheter in place with dressing clean dry intact  Neurological: He is alert and oriented to person, place, and time.  Skin: Skin is warm and dry.  Assessment/Plan:     52 year old male with end-stage renal disease multiple previous access procedures that have all  failed.  He is now on dialysis via tunneled catheter was placed through a collateral vein into his IVC but is thankfully working at this time.  I discussed with him that this is probably short-term resolution.  We discussed the need for possible lumbar access which may be in his future.  His last option that I can offer him is to expose his common femoral vein on the right and attempt to get central access in the hybrid suite.  We can do this I would likely place a hero catheter and a quick stick graft and remove his tunneled catheter.  I will plan to keep access via his tunneled catheter during this procedure in case we are unable to get across his common femoral vein.  He understands that this is a really last-ditch effort and that he could have healing issues given his history of infection.  We will get him scheduled for a nondialysis day in the near future and will keep him overnight for observation after the procedure.     Waynetta Sandy MD [facility]: [place] [facility]

## 2017-08-28 ENCOUNTER — Other Ambulatory Visit: Payer: Self-pay | Admitting: *Deleted

## 2017-09-18 ENCOUNTER — Encounter (HOSPITAL_COMMUNITY): Payer: Self-pay | Admitting: *Deleted

## 2017-09-18 ENCOUNTER — Other Ambulatory Visit: Payer: Self-pay

## 2017-09-18 NOTE — Progress Notes (Signed)
Mr [name] took Plavix today, I instructed him to hold it until after surgery.  I left a message on Kay's voice mail at [facility].

## 2017-09-19 MED ORDER — DEXTROSE 5 % IV SOLN
3.0000 g | INTRAVENOUS | Status: AC
  Administered 2017-09-20: 3 g via INTRAVENOUS
  Filled 2017-09-19: qty 3

## 2017-09-19 NOTE — Anesthesia Preprocedure Evaluation (Addendum)
Anesthesia Evaluation  Patient identified by MRN, date of birth, ID band Patient awake    Reviewed: Allergy & Precautions, NPO status , Patient's Chart, lab work & pertinent test results  History of Anesthesia Complications Negative for: history of anesthetic complications  Airway Mallampati: III  TM Distance: >3 FB Neck ROM: Full    Dental  (+) Dental Advisory Given, Poor Dentition   Pulmonary neg pulmonary ROS,    breath sounds clear to auscultation       Cardiovascular hypertension, + Valvular Problems/Murmurs  Rhythm:Regular Rate:Normal + Systolic murmurs    Neuro/Psych  Headaches, Seizures -, Well Controlled,  Anxiety  Spina bifida     GI/Hepatic Neg liver ROS, GERD  ,  Endo/Other  Hypothyroidism  Obesity   Renal/GU ESRF and DialysisRenal disease  negative genitourinary   Musculoskeletal negative musculoskeletal ROS (+)   Abdominal   Peds  Hematology  (+) anemia ,   Anesthesia Other Findings   Reproductive/Obstetrics                            Anesthesia Physical Anesthesia Plan  ASA: III  Anesthesia Plan: General   Post-op Pain Management:    Induction: Intravenous  PONV Risk Score and Plan: 3 and Treatment may vary due to age or medical condition, Ondansetron, Midazolam and Dexamethasone  Airway Management Planned: Oral ETT and Video Laryngoscope Planned  Additional Equipment: None  Intra-op Plan:   Post-operative Plan: Extubation in OR  Informed Consent: I have reviewed the patients History and Physical, chart, labs and discussed the procedure including the risks, benefits and alternatives for the proposed anesthesia with the patient or authorized representative who has indicated his/her understanding and acceptance.   Dental advisory given  Plan Discussed with: CRNA and Anesthesiologist  Anesthesia Plan Comments:        Anesthesia Quick Evaluation

## 2017-09-20 ENCOUNTER — Ambulatory Visit (HOSPITAL_COMMUNITY): Payer: Medicare Other | Admitting: Certified Registered"

## 2017-09-20 ENCOUNTER — Encounter (HOSPITAL_COMMUNITY): Payer: Self-pay

## 2017-09-20 ENCOUNTER — Observation Stay (HOSPITAL_COMMUNITY)
Admission: RE | Admit: 2017-09-20 | Discharge: 2017-09-21 | Disposition: A | Discharge status: Home / Self Care | Payer: Medicare Other | Source: Ambulatory Visit | Attending: Vascular Surgery | Admitting: Vascular Surgery

## 2017-09-20 ENCOUNTER — Encounter (HOSPITAL_COMMUNITY)
Admission: RE | Disposition: A | Discharge status: Home / Self Care | Payer: Self-pay | Source: Ambulatory Visit | Attending: Vascular Surgery

## 2017-09-20 ENCOUNTER — Other Ambulatory Visit: Payer: Self-pay

## 2017-09-20 DIAGNOSIS — N185 Chronic kidney disease, stage 5: Secondary | ICD-10-CM | POA: Diagnosis not present

## 2017-09-20 DIAGNOSIS — E039 Hypothyroidism, unspecified: Secondary | ICD-10-CM | POA: Insufficient documentation

## 2017-09-20 DIAGNOSIS — Z7902 Long term (current) use of antithrombotics/antiplatelets: Secondary | ICD-10-CM | POA: Diagnosis not present

## 2017-09-20 DIAGNOSIS — G4700 Insomnia, unspecified: Secondary | ICD-10-CM | POA: Diagnosis not present

## 2017-09-20 DIAGNOSIS — F419 Anxiety disorder, unspecified: Secondary | ICD-10-CM | POA: Diagnosis not present

## 2017-09-20 DIAGNOSIS — Q059 Spina bifida, unspecified: Secondary | ICD-10-CM | POA: Diagnosis not present

## 2017-09-20 DIAGNOSIS — N2581 Secondary hyperparathyroidism of renal origin: Secondary | ICD-10-CM | POA: Diagnosis not present

## 2017-09-20 DIAGNOSIS — Z79890 Hormone replacement therapy (postmenopausal): Secondary | ICD-10-CM | POA: Diagnosis not present

## 2017-09-20 DIAGNOSIS — Z91041 Radiographic dye allergy status: Secondary | ICD-10-CM | POA: Insufficient documentation

## 2017-09-20 DIAGNOSIS — Z79899 Other long term (current) drug therapy: Secondary | ICD-10-CM | POA: Diagnosis not present

## 2017-09-20 DIAGNOSIS — D631 Anemia in chronic kidney disease: Secondary | ICD-10-CM | POA: Insufficient documentation

## 2017-09-20 DIAGNOSIS — Z881 Allergy status to other antibiotic agents status: Secondary | ICD-10-CM | POA: Diagnosis not present

## 2017-09-20 DIAGNOSIS — N186 End stage renal disease: Secondary | ICD-10-CM | POA: Diagnosis present

## 2017-09-20 DIAGNOSIS — Z95820 Peripheral vascular angioplasty status with implants and grafts: Secondary | ICD-10-CM | POA: Insufficient documentation

## 2017-09-20 DIAGNOSIS — K219 Gastro-esophageal reflux disease without esophagitis: Secondary | ICD-10-CM | POA: Insufficient documentation

## 2017-09-20 DIAGNOSIS — Z888 Allergy status to other drugs, medicaments and biological substances status: Secondary | ICD-10-CM | POA: Insufficient documentation

## 2017-09-20 DIAGNOSIS — Z992 Dependence on renal dialysis: Secondary | ICD-10-CM | POA: Diagnosis not present

## 2017-09-20 DIAGNOSIS — Z7982 Long term (current) use of aspirin: Secondary | ICD-10-CM | POA: Diagnosis not present

## 2017-09-20 DIAGNOSIS — Z87442 Personal history of urinary calculi: Secondary | ICD-10-CM | POA: Diagnosis not present

## 2017-09-20 DIAGNOSIS — Z8701 Personal history of pneumonia (recurrent): Secondary | ICD-10-CM | POA: Insufficient documentation

## 2017-09-20 DIAGNOSIS — I120 Hypertensive chronic kidney disease with stage 5 chronic kidney disease or end stage renal disease: Principal | ICD-10-CM | POA: Insufficient documentation

## 2017-09-20 DIAGNOSIS — Z9889 Other specified postprocedural states: Secondary | ICD-10-CM | POA: Insufficient documentation

## 2017-09-20 DIAGNOSIS — Z882 Allergy status to sulfonamides status: Secondary | ICD-10-CM | POA: Insufficient documentation

## 2017-09-20 HISTORY — PX: VENOGRAM: SHX5497

## 2017-09-20 LAB — POCT I-STAT 4, (NA,K, GLUC, HGB,HCT)
Glucose, Bld: 89 mg/dL (ref 70–99)
HCT: 41 % (ref 39.0–52.0)
Hemoglobin: 13.9 g/dL (ref 13.0–17.0)
Potassium: 4.7 mmol/L (ref 3.5–5.1)
Sodium: 137 mmol/L (ref 135–145)

## 2017-09-20 LAB — CREATININE, SERUM
Creatinine, Ser: 7.33 mg/dL — ABNORMAL HIGH (ref 0.61–1.24)
GFR calc Af Amer: 9 mL/min — ABNORMAL LOW (ref >60)
GFR calc non Af Amer: 8 mL/min — ABNORMAL LOW (ref >60)

## 2017-09-20 SURGERY — VENOGRAM
Anesthesia: General | Site: Groin | Laterality: Right

## 2017-09-20 MED ORDER — HEPARIN SODIUM (PORCINE) 1000 UNIT/ML IJ SOLN
INTRAMUSCULAR | Status: DC | PRN
  Administered 2017-09-20: 1000 [IU] via INTRAVENOUS

## 2017-09-20 MED ORDER — CHLORHEXIDINE GLUCONATE CLOTH 2 % EX PADS
6.0000 | MEDICATED_PAD | Freq: Every day | CUTANEOUS | Status: DC
  Administered 2017-09-21: 6 via TOPICAL

## 2017-09-20 MED ORDER — POTASSIUM CHLORIDE CRYS ER 20 MEQ PO TBCR: 20.0000 meq | EXTENDED_RELEASE_TABLET | Freq: Once | ORAL | Status: DC

## 2017-09-20 MED ORDER — FAMOTIDINE 20 MG PO TABS
40.0000 mg | ORAL_TABLET | Freq: Every day | ORAL | Status: DC
  Administered 2017-09-20: 40 mg via ORAL
  Filled 2017-09-20: qty 2

## 2017-09-20 MED ORDER — OXYCODONE HCL 5 MG/5ML PO SOLN: 5.0000 mg | Freq: Once | ORAL | Status: DC | PRN

## 2017-09-20 MED ORDER — CLOPIDOGREL BISULFATE 75 MG PO TABS
75.0000 mg | ORAL_TABLET | Freq: Every day | ORAL | Status: DC
  Administered 2017-09-21: 75 mg via ORAL
  Filled 2017-09-20: qty 1

## 2017-09-20 MED ORDER — FENTANYL CITRATE (PF) 100 MCG/2ML IJ SOLN
INTRAMUSCULAR | Status: DC | PRN
  Administered 2017-09-20: 25 ug via INTRAVENOUS
  Administered 2017-09-20: 100 ug via INTRAVENOUS
  Administered 2017-09-20: 50 ug via INTRAVENOUS
  Administered 2017-09-20: 25 ug via INTRAVENOUS
  Administered 2017-09-20: 50 ug via INTRAVENOUS

## 2017-09-20 MED ORDER — PHENOL 1.4 % MT LIQD: 1.0000 | OROMUCOSAL | Status: DC | PRN

## 2017-09-20 MED ORDER — MIDAZOLAM HCL 2 MG/2ML IJ SOLN
INTRAMUSCULAR | Status: AC
  Filled 2017-09-20: qty 2

## 2017-09-20 MED ORDER — OXYCODONE-ACETAMINOPHEN 5-325 MG PO TABS
ORAL_TABLET | ORAL | Status: AC
  Administered 2017-09-20: 1
  Filled 2017-09-20: qty 1

## 2017-09-20 MED ORDER — LIDOCAINE-EPINEPHRINE (PF) 1 %-1:200000 IJ SOLN
INTRAMUSCULAR | Status: AC
  Filled 2017-09-20: qty 30

## 2017-09-20 MED ORDER — NEOSTIGMINE METHYLSULFATE 3 MG/3ML IV SOSY
PREFILLED_SYRINGE | INTRAVENOUS | Status: AC
  Filled 2017-09-20: qty 3

## 2017-09-20 MED ORDER — FENTANYL CITRATE (PF) 100 MCG/2ML IJ SOLN
INTRAMUSCULAR | Status: AC
  Filled 2017-09-20: qty 2

## 2017-09-20 MED ORDER — ROCURONIUM BROMIDE 50 MG/5ML IV SOSY
PREFILLED_SYRINGE | INTRAVENOUS | Status: AC
  Filled 2017-09-20: qty 5

## 2017-09-20 MED ORDER — 0.9 % SODIUM CHLORIDE (POUR BTL) OPTIME
TOPICAL | Status: DC | PRN
  Administered 2017-09-20: 1000 mL

## 2017-09-20 MED ORDER — GLYCOPYRROLATE PF 0.2 MG/ML IJ SOSY
PREFILLED_SYRINGE | INTRAMUSCULAR | Status: AC
  Filled 2017-09-20: qty 2

## 2017-09-20 MED ORDER — METOPROLOL TARTRATE 5 MG/5ML IV SOLN: 2.0000 mg | INTRAVENOUS | Status: DC | PRN

## 2017-09-20 MED ORDER — LIDOCAINE HCL (CARDIAC) PF 100 MG/5ML IV SOSY
PREFILLED_SYRINGE | INTRAVENOUS | Status: DC | PRN
  Administered 2017-09-20: 40 mg via INTRATRACHEAL
  Administered 2017-09-20: 60 mg via INTRATRACHEAL

## 2017-09-20 MED ORDER — MIDAZOLAM HCL 5 MG/5ML IJ SOLN
INTRAMUSCULAR | Status: DC | PRN
  Administered 2017-09-20 (×2): 1 mg via INTRAVENOUS

## 2017-09-20 MED ORDER — ONDANSETRON HCL 4 MG/2ML IJ SOLN: 4.0000 mg | Freq: Four times a day (QID) | INTRAMUSCULAR | Status: DC | PRN

## 2017-09-20 MED ORDER — FUROSEMIDE 80 MG PO TABS: 80.0000 mg | ORAL_TABLET | Freq: Every day | ORAL | Status: DC | PRN

## 2017-09-20 MED ORDER — MORPHINE SULFATE (PF) 4 MG/ML IV SOLN
4.0000 mg | INTRAVENOUS | Status: DC | PRN
  Filled 2017-09-20: qty 1

## 2017-09-20 MED ORDER — ROCURONIUM BROMIDE 100 MG/10ML IV SOLN
INTRAVENOUS | Status: DC | PRN
  Administered 2017-09-20: 20 mg via INTRAVENOUS
  Administered 2017-09-20: 10 mg via INTRAVENOUS
  Administered 2017-09-20: 50 mg via INTRAVENOUS
  Administered 2017-09-20: 20 mg via INTRAVENOUS

## 2017-09-20 MED ORDER — GLYCOPYRROLATE 0.2 MG/ML IJ SOLN
INTRAMUSCULAR | Status: DC | PRN
  Administered 2017-09-20: 0.4 mg via INTRAVENOUS

## 2017-09-20 MED ORDER — PROPOFOL 10 MG/ML IV BOLUS
INTRAVENOUS | Status: AC
  Filled 2017-09-20: qty 20

## 2017-09-20 MED ORDER — PROMETHAZINE HCL 25 MG PO TABS
25.0000 mg | ORAL_TABLET | Freq: Two times a day (BID) | ORAL | Status: DC
  Administered 2017-09-20: 25 mg via ORAL
  Filled 2017-09-20: qty 1

## 2017-09-20 MED ORDER — SODIUM CHLORIDE 0.9 % IV SOLN
INTRAVENOUS | Status: DC | PRN
  Administered 2017-09-20: 09:00:00

## 2017-09-20 MED ORDER — HYDRALAZINE HCL 20 MG/ML IJ SOLN: 5.0000 mg | INTRAMUSCULAR | Status: DC | PRN

## 2017-09-20 MED ORDER — ASPIRIN 325 MG PO TABS
325.0000 mg | ORAL_TABLET | Freq: Every day | ORAL | Status: DC
  Administered 2017-09-20 – 2017-09-21 (×2): 325 mg via ORAL
  Filled 2017-09-20 (×2): qty 1

## 2017-09-20 MED ORDER — OXYCODONE HCL 5 MG PO TABS: 5.0000 mg | ORAL_TABLET | Freq: Once | ORAL | Status: DC | PRN

## 2017-09-20 MED ORDER — NEOSTIGMINE METHYLSULFATE 10 MG/10ML IV SOLN
INTRAVENOUS | Status: DC | PRN
  Administered 2017-09-20: 3 mg via INTRAVENOUS

## 2017-09-20 MED ORDER — ONDANSETRON HCL 4 MG/2ML IJ SOLN
4.0000 mg | Freq: Once | INTRAMUSCULAR | Status: AC | PRN
  Administered 2017-09-20: 4 mg via INTRAVENOUS

## 2017-09-20 MED ORDER — LORAZEPAM 0.5 MG PO TABS: 0.5000 mg | ORAL_TABLET | Freq: Every day | ORAL | Status: DC | PRN

## 2017-09-20 MED ORDER — OXYCODONE-ACETAMINOPHEN 5-325 MG PO TABS
1.0000 | ORAL_TABLET | Freq: Four times a day (QID) | ORAL | Status: DC | PRN
  Administered 2017-09-20: 1 via ORAL
  Filled 2017-09-20: qty 1

## 2017-09-20 MED ORDER — LEVOTHYROXINE SODIUM 25 MCG PO TABS: 125.0000 ug | ORAL_TABLET | Freq: Every day | ORAL | Status: DC

## 2017-09-20 MED ORDER — GUAIFENESIN-DM 100-10 MG/5ML PO SYRP: 15.0000 mL | ORAL_SOLUTION | ORAL | Status: DC | PRN

## 2017-09-20 MED ORDER — HEMOSTATIC AGENTS (NO CHARGE) OPTIME
TOPICAL | Status: DC | PRN
  Administered 2017-09-20: 1 via TOPICAL

## 2017-09-20 MED ORDER — FAMOTIDINE 20 MG PO TABS
20.0000 mg | ORAL_TABLET | Freq: Every day | ORAL | Status: DC
  Administered 2017-09-21: 20 mg via ORAL
  Filled 2017-09-20: qty 1

## 2017-09-20 MED ORDER — LABETALOL HCL 5 MG/ML IV SOLN: 10.0000 mg | INTRAVENOUS | Status: DC | PRN

## 2017-09-20 MED ORDER — ZOLPIDEM TARTRATE 5 MG PO TABS
10.0000 mg | ORAL_TABLET | Freq: Every day | ORAL | Status: DC
  Administered 2017-09-20: 10 mg via ORAL
  Filled 2017-09-20: qty 2

## 2017-09-20 MED ORDER — PROPOFOL 10 MG/ML IV BOLUS
INTRAVENOUS | Status: DC | PRN
  Administered 2017-09-20: 130 mg via INTRAVENOUS
  Administered 2017-09-20: 40 mg via INTRAVENOUS
  Administered 2017-09-20: 30 mg via INTRAVENOUS

## 2017-09-20 MED ORDER — SODIUM CHLORIDE 0.9 % IV SOLN
INTRAVENOUS | Status: AC
  Filled 2017-09-20: qty 1.2

## 2017-09-20 MED ORDER — SODIUM CHLORIDE 0.9 % IV SOLN
INTRAVENOUS | Status: DC | PRN
  Administered 2017-09-20: 15 ug/min via INTRAVENOUS

## 2017-09-20 MED ORDER — DEXAMETHASONE SODIUM PHOSPHATE 10 MG/ML IJ SOLN
INTRAMUSCULAR | Status: DC | PRN
  Administered 2017-09-20: 10 mg via INTRAVENOUS

## 2017-09-20 MED ORDER — OXYCODONE-ACETAMINOPHEN 10-325 MG PO TABS: 1.0000 | ORAL_TABLET | Freq: Four times a day (QID) | ORAL | Status: DC | PRN

## 2017-09-20 MED ORDER — EPHEDRINE SULFATE 50 MG/ML IJ SOLN
INTRAMUSCULAR | Status: DC | PRN
  Administered 2017-09-20: 15 mg via INTRAVENOUS
  Administered 2017-09-20: 10 mg via INTRAVENOUS
  Administered 2017-09-20: 15 mg via INTRAVENOUS

## 2017-09-20 MED ORDER — SODIUM CHLORIDE 0.9 % IV SOLN
INTRAVENOUS | Status: DC
  Administered 2017-09-20: 08:00:00 via INTRAVENOUS

## 2017-09-20 MED ORDER — PANTOPRAZOLE SODIUM 40 MG PO TBEC
40.0000 mg | DELAYED_RELEASE_TABLET | Freq: Every day | ORAL | Status: DC
  Filled 2017-09-20: qty 1

## 2017-09-20 MED ORDER — FENTANYL CITRATE (PF) 100 MCG/2ML IJ SOLN
25.0000 ug | INTRAMUSCULAR | Status: DC | PRN
  Administered 2017-09-20 (×2): 50 ug via INTRAVENOUS

## 2017-09-20 MED ORDER — MIDODRINE HCL 5 MG PO TABS
10.0000 mg | ORAL_TABLET | ORAL | Status: DC
  Administered 2017-09-21: 10 mg via ORAL
  Filled 2017-09-20: qty 2

## 2017-09-20 MED ORDER — DOXERCALCIFEROL 4 MCG/2ML IV SOLN
6.0000 ug | INTRAVENOUS | Status: DC
  Administered 2017-09-21: 6 ug via INTRAVENOUS

## 2017-09-20 MED ORDER — TRAZODONE HCL 100 MG PO TABS
100.0000 mg | ORAL_TABLET | Freq: Every day | ORAL | Status: DC
  Administered 2017-09-20: 100 mg via ORAL
  Filled 2017-09-20: qty 1

## 2017-09-20 MED ORDER — OXYCODONE HCL 5 MG PO TABS
5.0000 mg | ORAL_TABLET | Freq: Four times a day (QID) | ORAL | Status: DC | PRN
  Administered 2017-09-20: 5 mg via ORAL
  Filled 2017-09-20: qty 1

## 2017-09-20 MED ORDER — IODIXANOL 320 MG/ML IV SOLN
INTRAVENOUS | Status: DC | PRN
  Administered 2017-09-20: 56 mL

## 2017-09-20 MED ORDER — HEPARIN SODIUM (PORCINE) 1000 UNIT/ML IJ SOLN
INTRAMUSCULAR | Status: AC
  Filled 2017-09-20: qty 1

## 2017-09-20 MED ORDER — HEPARIN SODIUM (PORCINE) 5000 UNIT/ML IJ SOLN
5000.0000 [IU] | Freq: Three times a day (TID) | INTRAMUSCULAR | Status: DC
  Administered 2017-09-20 – 2017-09-21 (×3): 5000 [IU] via SUBCUTANEOUS
  Filled 2017-09-20 (×2): qty 1

## 2017-09-20 MED ORDER — ONDANSETRON HCL 4 MG/2ML IJ SOLN
INTRAMUSCULAR | Status: AC
  Filled 2017-09-20: qty 2

## 2017-09-20 MED ORDER — PHENYLEPHRINE HCL 10 MG/ML IJ SOLN
INTRAMUSCULAR | Status: DC | PRN
  Administered 2017-09-20 (×3): 80 ug via INTRAVENOUS

## 2017-09-20 MED ORDER — FLUTICASONE PROPIONATE 50 MCG/ACT NA SUSP
2.0000 | Freq: Every day | NASAL | Status: DC
  Administered 2017-09-21: 2 via NASAL
  Filled 2017-09-20: qty 16

## 2017-09-20 MED ORDER — EPHEDRINE 5 MG/ML INJ
INTRAVENOUS | Status: AC
  Filled 2017-09-20: qty 10

## 2017-09-20 MED ORDER — ONDANSETRON HCL 4 MG/2ML IJ SOLN
INTRAMUSCULAR | Status: DC | PRN
  Administered 2017-09-20: 4 mg via INTRAVENOUS

## 2017-09-20 MED ORDER — FENTANYL CITRATE (PF) 250 MCG/5ML IJ SOLN
INTRAMUSCULAR | Status: AC
  Filled 2017-09-20: qty 5

## 2017-09-20 SURGICAL SUPPLY — 100 items
ARMBAND PINK RESTRICT EXTREMIT (MISCELLANEOUS) ×4 IMPLANT
BAG BANDED W/RUBBER/TAPE 36X54 (MISCELLANEOUS) ×4 IMPLANT
BAG DECANTER FOR FLEXI CONT (MISCELLANEOUS) IMPLANT
BAG SNAP BAND KOVER 36X36 (MISCELLANEOUS) ×4 IMPLANT
BANDAGE ACE 4X5 VEL STRL LF (GAUZE/BANDAGES/DRESSINGS) IMPLANT
BIOPATCH RED 1 DISK 7.0 (GAUZE/BANDAGES/DRESSINGS) ×4 IMPLANT
BLADE SURG 11 STRL SS (BLADE) IMPLANT
CANISTER SUCT 3000ML PPV (MISCELLANEOUS) ×4 IMPLANT
CATH ANGIO 5F BER2 65CM (CATHETERS) ×4 IMPLANT
CATH BEACON 5 .035 65 KMP TIP (CATHETERS) ×4 IMPLANT
CATH OMNI FLUSH .035X70CM (CATHETERS) IMPLANT
CATH PALINDROME RT-P 15FX19CM (CATHETERS) IMPLANT
CATH PALINDROME RT-P 15FX23CM (CATHETERS) IMPLANT
CATH PALINDROME RT-P 15FX28CM (CATHETERS) IMPLANT
CATH PALINDROME RT-P 15FX55CM (CATHETERS) IMPLANT
CLIP VESOCCLUDE MED 6/CT (CLIP) ×8 IMPLANT
CLIP VESOCCLUDE SM WIDE 6/CT (CLIP) ×8 IMPLANT
COMPONENT HERO ACCESSORY KIT (VASCULAR PRODUCTS) IMPLANT
COVER DOME SNAP 22 D (MISCELLANEOUS) ×4 IMPLANT
COVER PROBE W GEL 5X96 (DRAPES) ×4 IMPLANT
COVER SURGICAL LIGHT HANDLE (MISCELLANEOUS) IMPLANT
DECANTER SPIKE VIAL GLASS SM (MISCELLANEOUS) ×4 IMPLANT
DERMABOND ADVANCED (GAUZE/BANDAGES/DRESSINGS) ×2
DERMABOND ADVANCED .7 DNX12 (GAUZE/BANDAGES/DRESSINGS) ×6 IMPLANT
DEVICE TORQUE KENDALL .025-038 (MISCELLANEOUS) ×8 IMPLANT
DRAIN CHANNEL 15F RND FF W/TCR (WOUND CARE) IMPLANT
DRAPE C-ARM 42X72 X-RAY (DRAPES) IMPLANT
DRAPE CHEST BREAST 15X10 FENES (DRAPES) IMPLANT
DRAPE INCISE IOBAN 66X45 STRL (DRAPES) ×4 IMPLANT
DRAPE ORTHO SPLIT 77X108 STRL (DRAPES) ×1
DRAPE SURG ORHT 6 SPLT 77X108 (DRAPES) ×3 IMPLANT
DRAPE X-RAY CASS 24X20 (DRAPES) IMPLANT
DRSG MEPILEX BORDER 4X4 (GAUZE/BANDAGES/DRESSINGS) ×8 IMPLANT
DRSG TEGADERM 4X4.75 (GAUZE/BANDAGES/DRESSINGS) ×4 IMPLANT
ELECT BLADE 4.0 EZ CLEAN MEGAD (MISCELLANEOUS) ×4
ELECT REM PT RETURN 9FT ADLT (ELECTROSURGICAL) ×4
ELECTRODE BLDE 4.0 EZ CLN MEGD (MISCELLANEOUS) ×3 IMPLANT
ELECTRODE REM PT RTRN 9FT ADLT (ELECTROSURGICAL) ×3 IMPLANT
EVACUATOR SILICONE 100CC (DRAIN) IMPLANT
GAUZE 4X4 16PLY RFD (DISPOSABLE) IMPLANT
GLIDEWIRE ADV .035X260CM (WIRE) ×4 IMPLANT
GLOVE BIO SURGEON STRL SZ7.5 (GLOVE) ×4 IMPLANT
GOWN STRL REUS W/ TWL LRG LVL3 (GOWN DISPOSABLE) ×6 IMPLANT
GOWN STRL REUS W/ TWL XL LVL3 (GOWN DISPOSABLE) ×6 IMPLANT
GOWN STRL REUS W/TWL LRG LVL3 (GOWN DISPOSABLE) ×2
GOWN STRL REUS W/TWL XL LVL3 (GOWN DISPOSABLE) ×2
HEMOSTAT SNOW SURGICEL 2X4 (HEMOSTASIS) IMPLANT
INSERT FOGARTY SM (MISCELLANEOUS) IMPLANT
INTRODUCER KIT GALT 7CM (INTRODUCER) ×1
KIT BASIN OR (CUSTOM PROCEDURE TRAY) ×4 IMPLANT
KIT INTRODUCER GALT 7 (INTRODUCER) ×3 IMPLANT
KIT TURNOVER KIT B (KITS) ×4 IMPLANT
NEEDLE 18GX1X1/2 (RX/OR ONLY) (NEEDLE) IMPLANT
NEEDLE HYPO 25GX1X1/2 BEV (NEEDLE) ×4 IMPLANT
NEEDLE PERC 18GX7CM (NEEDLE) ×4 IMPLANT
NS IRRIG 1000ML POUR BTL (IV SOLUTION) ×4 IMPLANT
PACK CV ACCESS (CUSTOM PROCEDURE TRAY) ×4 IMPLANT
PACK GENERAL/GYN (CUSTOM PROCEDURE TRAY) IMPLANT
PACK PERIPHERAL VASCULAR (CUSTOM PROCEDURE TRAY) IMPLANT
PACK SURGICAL SETUP 50X90 (CUSTOM PROCEDURE TRAY) ×4 IMPLANT
PAD ARMBOARD 7.5X6 YLW CONV (MISCELLANEOUS) ×8 IMPLANT
PADDING CAST ABS 6INX4YD NS (CAST SUPPLIES)
PADDING CAST ABS COTTON 6X4 NS (CAST SUPPLIES) IMPLANT
SET COLLECT BLD 21X3/4 12 (NEEDLE) IMPLANT
SET MICROPUNCTURE 5F STIFF (MISCELLANEOUS) ×16 IMPLANT
SHEATH AVANTI 11CM 5FR (SHEATH) IMPLANT
SHEATH BRITE TIP 6FR 35CM (SHEATH) ×8 IMPLANT
SHEATH PINNACLE 5F 10CM (SHEATH) ×8 IMPLANT
SOAP 2 % CHG 4 OZ (WOUND CARE) IMPLANT
SPONGE LAP 18X18 X RAY DECT (DISPOSABLE) ×4 IMPLANT
STOPCOCK 4 WAY LG BORE MALE ST (IV SETS) IMPLANT
STOPCOCK MORSE 400PSI 3WAY (MISCELLANEOUS) IMPLANT
SUT ETHILON 3 0 PS 1 (SUTURE) IMPLANT
SUT MNCRL AB 4-0 PS2 18 (SUTURE) ×4 IMPLANT
SUT PROLENE 5 0 C 1 24 (SUTURE) ×12 IMPLANT
SUT PROLENE 5 0 C 1 36 (SUTURE) ×4 IMPLANT
SUT PROLENE 6 0 BV (SUTURE) ×8 IMPLANT
SUT PROLENE 7 0 BV 1 (SUTURE) IMPLANT
SUT SILK 2 0 SH (SUTURE) IMPLANT
SUT VIC AB 2-0 CT1 27 (SUTURE) ×1
SUT VIC AB 2-0 CT1 TAPERPNT 27 (SUTURE) ×3 IMPLANT
SUT VIC AB 3-0 SH 27 (SUTURE) ×2
SUT VIC AB 3-0 SH 27X BRD (SUTURE) ×6 IMPLANT
SYR 10ML LL (SYRINGE) ×4 IMPLANT
SYR 20CC LL (SYRINGE) ×8 IMPLANT
SYR 30ML LL (SYRINGE) ×4 IMPLANT
SYR 5ML LL (SYRINGE) ×4 IMPLANT
SYR CONTROL 10ML LL (SYRINGE) ×4 IMPLANT
SYR MEDRAD MARK V 150ML (SYRINGE) IMPLANT
TOWEL GREEN STERILE (TOWEL DISPOSABLE) ×4 IMPLANT
TOWEL GREEN STERILE FF (TOWEL DISPOSABLE) ×4 IMPLANT
TOWEL OR 17X26 4PK STRL BLUE (TOWEL DISPOSABLE) ×4 IMPLANT
TRAY FOLEY MTR SLVR 16FR STAT (SET/KITS/TRAYS/PACK) IMPLANT
TUBING EXTENTION W/L.L. (IV SETS) IMPLANT
TUBING HIGH PRESSURE 120CM (CONNECTOR) ×4 IMPLANT
UNDERPAD 30X30 (UNDERPADS AND DIAPERS) ×4 IMPLANT
WATER STERILE IRR 1000ML POUR (IV SOLUTION) ×4 IMPLANT
WIRE AMPLATZ SS-J .035X180CM (WIRE) ×4 IMPLANT
WIRE BENTSON .035X145CM (WIRE) ×4 IMPLANT
WIRE J 3MM .035X145CM (WIRE) IMPLANT

## 2017-09-20 NOTE — H&P (Signed)
   History and Physical Update  The patient was interviewed and re-examined.  The patient's previous History and Physical has been reviewed and is unchanged from recent office visit. Plan for exposure of right common femoral vein with hopeful placement of HeRO catheter. He is currently dialyzing via catheter and getting 400cc/min and dialyzing successfully. He understands this is a last ditch effort to get him permanent access.  Denetria Luevanos C. Donzetta Matters, MD [facility] Office: [phone] Pager: [phone]  09/20/2017, 7:42 AM

## 2017-09-20 NOTE — Transfer of Care (Signed)
Immediate Anesthesia Transfer of Care Note  Patient: [name]  Procedure(s) Performed: RIGHT COMMON FEMORAL ARTERY EXPLORATION.  CANNULATION RIGHT COMMON FEMORAL VEIN. VENOGRAM CENTRAL ULTRA SOUND GUIDED RIGHT FEMORAL VEIN TIMES TWO. (N/A Groin)  Patient Location: PACU  Anesthesia Type:General  Level of Consciousness: awake, alert , oriented and sedated  Airway & Oxygen Therapy: Patient Spontanous Breathing and Patient connected to nasal cannula oxygen  Post-op Assessment: Report given to RN, Post -op Vital signs reviewed and stable and Patient moving all extremities  Post vital signs: Reviewed and stable  Last Vitals:  Vitals Value Taken Time  BP 111/57 09/20/2017 11:51 AM  Temp    Pulse 71 09/20/2017 11:54 AM  Resp 13 09/20/2017 11:54 AM  SpO2 98 % 09/20/2017 11:54 AM  Vitals shown include unvalidated device data.  Last Pain:  Vitals:   09/20/17 0730  TempSrc:   PainSc: 6       Patients Stated Pain Goal: 5 (72/62/03 5597)  Complications: No apparent anesthesia complications

## 2017-09-20 NOTE — Plan of Care (Signed)
Patient needs support in self care management in the set of chronic kidney failure. Will listen to patient's needs and intervene as needed. Provide spiritual and social support.

## 2017-09-20 NOTE — Plan of Care (Signed)
  Problem: Health Behavior/Discharge Planning: Goal: Ability to manage health-related needs will improve Outcome: Progressing   Problem: Clinical Measurements: Goal: Ability to maintain clinical measurements within normal limits will improve Outcome: Progressing   Problem: Clinical Measurements: Goal: Will remain free from infection Outcome: Progressing   Problem: Clinical Measurements: Goal: Diagnostic test results will improve Outcome: Progressing   

## 2017-09-20 NOTE — Progress Notes (Signed)
1330 -Received patient from PACU. Patient very withdrawn and with c/o pain. Received pain medication in the PACU prior to transfer to the unit. Patient able to transfer self from stretcher to the floor. Patient has his own wheelchair with belongings in the seat.  Admission assessment completed. Call bell within reach and bed in lowest position with siderails x3. SCD placed only on the right as patient refused bilateral.

## 2017-09-20 NOTE — Op Note (Signed)
Patient name: Monnie Anspach MRN: [id number] DOB: 07-24-1965 Sex: male  09/20/2017 Pre-operative Diagnosis: esrd Post-operative diagnosis:  Same Surgeon:  Erlene Quan C. Donzetta Matters, MD Assistant: Arlee Muslim, PA Procedure Performed: 1.  Exposure of right common femoral artery 2.  Cannulation of right common femoral vein x3 and venogram 3.  US guided cannulation of left common femoral vein x 2 and venogram  Indications: 52 year old male with history of end-stage renal disease.  He is currently on dialysis via tunneled catheter that is tunneled through a collateral vein into his IVC.  He is now indicated for permanent access we are going to try to establish that via a right femoral approach.  Findings: Common femoral vein was sclerotic entire lumen was taken by the existing catheter.  I could not get through true luminal he had a perforation of the external iliac vein on the right.  The right common femoral artery was quite friable and manipulating it had to be repaired.  I was able to cannulate the left common femoral vein with ultrasound guidance but also could not get through true luminal he and had a perforation of the left external iliac vein.   Patient has limited options for further access and will continue to use the tunnel catheter for now.  Procedure:  The patient was identified in the holding area and taken to the operating room where he was sterilely prepped and draped in the bilateral groins in the usual fashion he was given antibiotics and a timeout was called.  We began by using ultrasound to identify our catheter tunneled in the groin down to the common femoral vein and artery.  We then made a transverse incision over this.  We dissected down to the common femoral artery.  In manipulating this there was a hematoma that did ooze and this was repaired with a 5-0 Prolene suture in a figure-of-eight fashion.  We then identified the common femoral vein.  This was directly cannulated with  micropuncture needle followed by wire we try to place that sheath.  Unfortunately this caused a tear in the vein we could see our catheter.  We are able to get manual pressure above and below and repaired the venotomy with 5-0 Prolene suture in a figure-of-eight fashion.  Having had that issue we then elected to try to cannulate the vein inferiorly.  I dissected out the vein distal to where the catheter entered the vein and then cannulated with micro puncture needle that I tunnel.  Unfortunately the wire would only track along the existing tunneled catheter that was above it.  With this I then performed angiography from below and demonstrated the takeoff of the external iliac vein although it was occluded.  Using fluoroscopic guidance as well as direct visualization I cannulated the external iliac vein up under the inguinal ligament above where the catheter existed.  I then placed the wire this did seem to go in the correct direction.  I placed a micropuncture sheath and then using a Glidewire advantage tried to cross but unfortunately caused a perforation.  I then placed a long 6 [place] sheath attempt to using angled catheter to get back into the vein but could not.  I then elected to remove all my sheath and wires from the right side.  I then attempted from the left side using ultrasound.  I was able to cannulate the vein both at the inguinal ligament as well as below that using ultrasound guidance directly.  I placed a micropuncture sheath  in both areas was able to get a wire tract from the inferior most area and then placed a micropuncture sheath.  I performed venography which demonstrated that I had again perforated.  I again used a wire placed a 6 French bright tip sheath and then try to use an angled catheter to get into the occluded stent but could not.  The sheath and wire were removed and pressure was held.  Unfortunately there were no further options to get access.  I had previously wired his existing  tunneled catheter this was flushed.  I then locked it with concentrated heparin 3 cc in either port and placed caps.  I irrigated the right groin wound and closed in layers with Vicryl and Monocryl.  Dermabond was placed at the level of the skin.  He was allowed away from anesthesia having tolerated procedure well without immediate complication.  EBL 250 cc   Brandon C. Donzetta Matters, MD [facility] [place] Office: [phone] Pager: [phone]

## 2017-09-20 NOTE — Consult Note (Addendum)
[facility] Renal Consultation Note    Indication for Consultation:  Management of ESRD/hemodialysis, anemia, hypertension/volume, and secondary hyperparathyroidism. PCP:  HPI: [name] is a 52 y.o. male with ESRD, HTN, hypothyroidism who was admitted for elective vascular access surgery.   Surgical plan was for possible HeRO AVG using location of R thigh TDC which is tunneled through collateral vein to his IVC. Unfortunately, Dr. Donzetta Matters was unable to successfully complete the surgery d/t  severely sclerotic friable veins. We were consulted for dialysis tomorrow. At this time, [name] c/o mild R leg pain, but no other symptoms. No CP, dyspnea, nausea, vomiting, abdominal pain, fever or chills.  Dialyzes on MWF schedule at [facility] - last HD was 9/18 which he completed in entirety.  Past Medical History:  Diagnosis Date  . Anemia   . Anxiety   . Constipation   . End stage renal failure on dialysis [facility])    "[place]; MWF" (02/29/2016)  . GERD (gastroesophageal reflux disease)   . Grand mal seizure ([place]) X 4   last 1998 (02/29/2016)  . Heart murmur    no problems with per pt. - nephrologist and Dr. Coletta Memos  . History of blood transfusion 2000s   "I was having blood loss; never found out from where"  . History of kidney stones   . Hypertension    hx of - has not taken bp meds in over 2 years  . Hypothyroidism   . Insomnia   . Migraine    "due to my BP in my late 1990s; none since" (02/29/2016)  . Nonhealing surgical wound    of the left arteriovenous graft  . Pneumonia    x 2  . Shortness of breath    rarely  . Spina bifida [facility])    Past Surgical History:  Procedure Laterality Date  . APPENDECTOMY    . ARTERIOVENOUS GRAFT PLACEMENT Left 10/16/2012   left femoral goretex graft         Dr Donnetta Hutching  . AV FISTULA PLACEMENT Left 06/27/2012   Procedure: EXPLORATORY LEFT THY-GRAFT PSEUDO-ANEURYSM;  Surgeon: Conrad [place], MD;  Location: [place];   Service: Vascular;  Laterality: Left;  Revision of left Arteriovenus gortex graft in thigh.  . COLONOSCOPY    . I&D EXTREMITY Left 02/29/2016   Procedure: DEBRIDEMENT LEFT THIGH WOUND;  Surgeon: Waynetta Sandy, MD;  Location: [place];  Service: Vascular;  Laterality: Left;  . ILEOSTOMY  1970s  . INCISION AND DRAINAGE Left 10/16/2012   Procedure: INCISION AND Debridement left thigh graft;  Surgeon: Rosetta Posner, MD;  Location: [place];  Service: Vascular;  Laterality: Left;  . INSERTION OF DIALYSIS CATHETER    . INSERTION OF DIALYSIS CATHETER  01/14/2016   Procedure: INSERTION OF DIALYSIS CATHETER;  Surgeon: Waynetta Sandy, MD;  Location: [place];  Service: Vascular;;  . INSERTION OF DIALYSIS CATHETER Right 08/07/2017   Procedure: INSERTION OF DIALYSIS CATHETER;  Surgeon: Waynetta Sandy, MD;  Location: [place];  Service: Vascular;  Laterality: Right;  . INSERTION OF ILIAC STENT Left 01/14/2016   Procedure: INSERTION OF ILIAC STENT;  Surgeon: Waynetta Sandy, MD;  Location: [place];  Service: Vascular;  Laterality: Left;  . INTRAOPERATIVE ARTERIOGRAM Left 01/14/2016   Procedure: INTRA OPERATIVE ARTERIOGRAM;  Surgeon: Waynetta Sandy, MD;  Location: [place];  Service: Vascular;  Laterality: Left;  . LITHOTRIPSY  x 3   "& a laser treatment" (02/29/2016)  . NEPHROSTOMY  Bilateral 1968  . PATELLAR TENDON REPAIR Right 1990s   "big incision"  . PERIPHERAL VASCULAR CATHETERIZATION  01/14/2016   Procedure: A/V SHUNTOGRAM;  Surgeon: Waynetta Sandy, MD;  Location: [place];  Service: Vascular;;  . REVISION OF ARTERIOVENOUS GORETEX GRAFT Left 10/16/2012   Procedure: REVISION OF LEFT FEMORAL LOOP ARTERIOVENOUS GORETEX GRAFT;  Surgeon: Rosetta Posner, MD;  Location: [place];  Service: Vascular;  Laterality: Left;  . REVISION OF ARTERIOVENOUS GORETEX GRAFT Left 02/11/2015   Procedure: EXCISION OF SMALL SEGMENT OF EXPOSED LEFT THIGH NON FUNCTIONING  ARTERIOVENOUS GORETEX GRAFT;   Surgeon: Mal Misty, MD;  Location: [place];  Service: Vascular;  Laterality: Left;  . REVISION OF ARTERIOVENOUS GORETEX GRAFT Left 01/14/2016   Procedure: REVISION OF ARTERIOVENOUS GORETEX GRAFT;  Surgeon: Waynetta Sandy, MD;  Location: [place];  Service: Vascular;  Laterality: Left;  . REVISION OF ARTERIOVENOUS GORETEX GRAFT Left 02/29/2016   thigh/pt report  . REVISION OF ARTERIOVENOUS GORETEX GRAFT Left 02/29/2016   Procedure: POSSIBLE REVISION OF LEFT THIGH ARTERIOVENOUS GORETEX GRAFT;  Surgeon: Waynetta Sandy, MD;  Location: [place];  Service: Vascular;  Laterality: Left;  . THROMBECTOMY W/ EMBOLECTOMY Left 01/14/2016   Procedure: THROMBECTOMY ARTERIOVENOUS GORE-TEX Left thigh GRAFT;  Surgeon: Waynetta Sandy, MD;  Location: [place];  Service: Vascular;  Laterality: Left;  . TONGUE SURGERY  ~ 1990   tongue-tie release   . ULTRASOUND GUIDANCE FOR VASCULAR ACCESS Right 08/07/2017   Procedure: ULTRASOUND GUIDANCE FOR VASCULAR CANNULATION RIGHT FEMORAL VEIN AND LEFT AV FEMORAL GRAFT;  Surgeon: Waynetta Sandy, MD;  Location: [place];  Service: Vascular;  Laterality: Right;  . UPPER EXTREMITY VENOGRAPHY Bilateral 08/09/2017   Procedure: UPPER EXTREMITY VENOGRAPHY;  Surgeon: Serafina Mitchell, MD;  Location: [place] [facility];  Service: Cardiovascular;  Laterality: Bilateral;  . WOUND DEBRIDEMENT Left 02/29/2016   thigh   Family History  Problem Relation Age of Onset  . Diabetes Mother   . Hypertension Mother   . Heart disease Mother        before age 62  . Diabetes Father   . Heart attack Father        X's 3  . Diabetes Sister   . Bipolar disorder Sister    Social History:  reports that he has never smoked. He has never used smokeless tobacco. He reports that he drank alcohol. He reports that he has current or past drug history. Drug: Marijuana.  ROS: As per HPI otherwise negative.  Physical Exam: Vitals:   09/20/17 1220 09/20/17 1235 09/20/17 1250  09/20/17 1256  BP: 112/64 (!) 107/57 112/62 (!) 112/59  Pulse: 67 69 67 67  Resp: 12 12 12 12   Temp:    (!) 97.5 F (36.4 C)  TempSrc:      SpO2: 100% 99% 98% 94%  Weight:      Height:         General: Well developed, well nourished, in no acute distress. Head: Normocephalic, atraumatic, sclera non-icteric, mucus membranes are moist. Neck: Supple without lymphadenopathy/masses. JVD not elevated. Lungs: Clear bilaterally to auscultation without wheezes, rales, or rhonchi. Breathing is unlabored. Heart: RRR; 4/6 holosystolic murmur noted Abdomen: Soft, non-tender, non-distended with normoactive bowel sounds.  Musculoskeletal:  Strength and tone appear normal for age. Lower extremities: No edema or ischemic changes, no open wounds. Neuro: Alert and oriented X 3. Moves all extremities spontaneously. Psych:  Responds to questions appropriately with a normal affect. Dialysis Access: R thigh  TDC (through collateral to IVC)  Allergies  Allergen Reactions  . Furadantin [Nitrofurantoin] Other (See Comments)    UNSPECIFIED REACTION   . Mandelamine [Methenamine] Other (See Comments)    UNSPECIFIED REACTION   . Noroxin [Norfloxacin] Other (See Comments)    UNSPECIFIED REACTION   . Carmine Nausea Only  . Contrast Media [Iodinated Diagnostic Agents] Nausea And Vomiting    Oral dye causes vomiting, IV dye is okay  . Metrizamide Nausea And Vomiting    Oral dye causes vomiting, IV dye is okay  . Sulfa Antibiotics Cough    Childhood reaction - pt could not confirm that it was a cough  . Sulfur Cough    Childhood reaction - pt could not confirm that it was a cough   Prior to Admission medications   Medication Sig Start Date End Date Taking? Authorizing Provider  aspirin 325 MG tablet Take 325 mg by mouth daily at 2 PM.    Yes [provider]  calcium acetate (PHOSLO) 667 MG capsule Take 2,001-2,668 mg by mouth See admin instructions. Take 2668 mg by mouth 3 times daily with meals,  take 2001 mg by mouth with snacks   Yes [provider]  clopidogrel (PLAVIX) 75 MG tablet Take 1 tablet (75 mg total) by mouth daily. 01/17/16  Yes Alvia Grove, PA-C  famotidine (PEPCID) 40 MG tablet Take 40 mg by mouth daily at 2 PM.    Yes [provider]  fluticasone (FLONASE) 50 MCG/ACT nasal spray Place 2 sprays into both nostrils daily.    Yes [provider]  furosemide (LASIX) 80 MG tablet Take 80 mg by mouth daily as needed (for swelling or shortness of breath).    Yes [provider]  levothyroxine (SYNTHROID, LEVOTHROID) 125 MCG tablet Take 125 mcg by mouth daily before breakfast.    Yes [provider]  LORazepam (ATIVAN) 0.5 MG tablet Take 0.5 mg by mouth daily as needed for anxiety.    Yes [provider]  midodrine (PROAMATINE) 5 MG tablet Take 1 tablet (5 mg total) by mouth 3 (three) times daily with meals. Patient taking differently: Take 10 mg by mouth every Monday, Wednesday, and Friday.  01/18/16  Yes Alvia Grove, PA-C  oxyCODONE-acetaminophen (PERCOCET) 10-325 MG tablet Take 1 tablet by mouth 4 (four) times daily as needed for pain. Patient taking differently: Take 1 tablet by mouth every 6 (six) hours.  03/01/16  Yes Alvia Grove, PA-C  promethazine (PHENERGAN) 25 MG tablet Take 25 mg by mouth 2 (two) times daily.    Yes [provider]  sodium polystyrene (KAYEXALATE) powder Take 15 g by mouth See admin instructions. Take 15 g (mixed in water) by mouth on Saturdays and Sundays   Yes [provider]  traZODone (DESYREL) 100 MG tablet Take 100 mg by mouth at bedtime.   Yes [provider]  zolpidem (AMBIEN) 10 MG tablet Take 10 mg by mouth at bedtime.    Yes [provider]   Current Facility-Administered Medications  Medication Dose Route Frequency Provider Last Rate Last Dose  . aspirin tablet 325 mg  325 mg Oral Q1400 Dagoberto Ligas, PA-C      . [START ON 09/21/2017]  clopidogrel (PLAVIX) tablet 75 mg  75 mg Oral Daily Dagoberto Ligas, PA-C      . famotidine (PEPCID) tablet 40 mg  40 mg Oral Q1400 Dagoberto Ligas, PA-C      . fentaNYL (SUBLIMAZE) 100 MCG/2ML injection           . [  START ON 09/21/2017] fluticasone (FLONASE) 50 MCG/ACT nasal spray 2 spray  2 spray Each Nare Daily Eveland, Matthew, PA-C      . furosemide (LASIX) tablet 80 mg  80 mg Oral Daily PRN Dagoberto Ligas, PA-C      . guaiFENesin-dextromethorphan (ROBITUSSIN DM) 100-10 MG/5ML syrup 15 mL  15 mL Oral Q4H PRN Dagoberto Ligas, PA-C      . heparin injection 5,000 Units  5,000 Units Subcutaneous Q8H Eveland, Matthew, PA-C      . hydrALAZINE (APRESOLINE) injection 5 mg  5 mg Intravenous Q20 Min PRN Dagoberto Ligas, PA-C      . labetalol (NORMODYNE,TRANDATE) injection 10 mg  10 mg Intravenous Q10 min PRN Dagoberto Ligas, PA-C      . [START ON 09/21/2017] levothyroxine (SYNTHROID, LEVOTHROID) tablet 125 mcg  125 mcg Oral QAC breakfast Dagoberto Ligas, PA-C      . LORazepam (ATIVAN) tablet 0.5 mg  0.5 mg Oral Daily PRN Dagoberto Ligas, PA-C      . metoprolol tartrate (LOPRESSOR) injection 2-5 mg  2-5 mg Intravenous Q2H PRN Dagoberto Ligas, PA-C      . [START ON 09/21/2017] midodrine (PROAMATINE) tablet 10 mg  10 mg Oral Q M,W,F Eveland, Matthew, PA-C      . morphine 4 MG/ML injection 4 mg  4 mg Intravenous Q2H PRN Dagoberto Ligas, PA-C      . ondansetron (ZOFRAN) 4 MG/2ML injection           . ondansetron (ZOFRAN) injection 4 mg  4 mg Intravenous Q6H PRN Dagoberto Ligas, PA-C      . oxyCODONE-acetaminophen (PERCOCET/ROXICET) 5-325 MG per tablet 1 tablet  1 tablet Oral QID PRN Hammons, Kimberly B, RPH       And  . oxyCODONE (Oxy IR/ROXICODONE) immediate release tablet 5 mg  5 mg Oral QID PRN Hammons, Theone Murdoch, RPH      . pantoprazole (PROTONIX) EC tablet 40 mg  40 mg Oral Daily Eveland, Matthew, PA-C      . phenol (CHLORASEPTIC) mouth spray 1 spray  1 spray Mouth/Throat PRN Dagoberto Ligas, PA-C      . potassium chloride SA (K-DUR,KLOR-CON) CR tablet 20-40 mEq  20-40 mEq Oral Once Dagoberto Ligas, PA-C      . promethazine (PHENERGAN) tablet 25 mg  25 mg Oral BID Dagoberto Ligas, PA-C      . traZODone (DESYREL) tablet 100 mg  100 mg Oral QHS Dagoberto Ligas, PA-C      . zolpidem (AMBIEN) tablet 10 mg  10 mg Oral QHS Dagoberto Ligas, PA-C       Labs: Basic Metabolic Panel: Recent Labs  Lab 09/20/17 0719  NA 137  K 4.7  GLUCOSE 89   CBC: Recent Labs  Lab 09/20/17 0719  HGB 13.9  HCT 41.0   Dialysis Orders:  MWF at [facility] 4:30hr, 450/800, EDW 123.5kg, 2K/2.25Ca, TDC, heparin 3700 bolus - Hectoral 69mcg IV q HD - Parsabiv 5mg  IV q HD (not on [facility] formulary) - Mircera 80mcg IV q 2 weeks (last given 9/13)  Assessment/Plan: 1.  Dialysis access (failed HeRO AVG attempt): Will continue to use TDC. 2.  ESRD:  Continue HD per MWF schedule, next 9/20. Will hold heparin tomorrow with HD. 3.  Hypertension/volume: BP ok, no edema. 4.  Anemia: Hgb 13.9 - no ESA for now. 5.  Metabolic bone disease: Ca/Phos pending, continue home binders.  [name], PA-C 09/20/2017, 2:19 PM  [facility] Pager: [phone]  Pt seen, examined and  agree w A/P as above.  [name] MD [employer] pager [phone]   09/20/2017, 3:36 PM

## 2017-09-20 NOTE — Anesthesia Postprocedure Evaluation (Signed)
Anesthesia Post Note  Patient: Yale Golla  Procedure(s) Performed: RIGHT COMMON FEMORAL ARTERY EXPLORATION.  CANNULATION RIGHT COMMON FEMORAL VEIN. VENOGRAM CENTRAL ULTRA SOUND GUIDED RIGHT FEMORAL VEIN TIMES TWO. (N/A Groin)     Patient location during evaluation: PACU Anesthesia Type: General Level of consciousness: awake and alert Pain management: pain level controlled Vital Signs Assessment: post-procedure vital signs reviewed and stable Respiratory status: spontaneous breathing, nonlabored ventilation and respiratory function stable Cardiovascular status: blood pressure returned to baseline and stable Postop Assessment: no apparent nausea or vomiting Anesthetic complications: no    Last Vitals:  Vitals:   09/20/17 1250 09/20/17 1256  BP: 112/62 (!) 112/59  Pulse: 67 67  Resp: 12 12  Temp:  (!) 36.4 C  SpO2: 98% 94%                 Audry Pili

## 2017-09-20 NOTE — Progress Notes (Signed)
Dr. Donzetta Matters in the room to speak with patient. Patient crying after talking with MD and just wanting to be alone at this time. Asked patient if he wanted to speak with chaplin stated not at this time.

## 2017-09-20 NOTE — Anesthesia Procedure Notes (Signed)
Procedure Name: Intubation Date/Time: 09/20/2017 8:22 AM Performed by: Scheryl Darter, CRNA Pre-anesthesia Checklist: Patient identified, Emergency Drugs available, Suction available and Patient being monitored Patient Re-evaluated:Patient Re-evaluated prior to induction Oxygen Delivery Method: Circle System Utilized Preoxygenation: Pre-oxygenation with 100% oxygen Induction Type: IV induction Ventilation: Mask ventilation without difficulty Laryngoscope Size: McGraph and 4 Grade View: Grade I Tube type: Oral Tube size: 7.5 mm Number of attempts: 1 Airway Equipment and Method: Rigid stylet and Video-laryngoscopy Placement Confirmation: ETT inserted through vocal cords under direct vision,  positive ETCO2 and breath sounds checked- equal and bilateral Secured at: 23 cm Tube secured with: Tape Dental Injury: Teeth and Oropharynx as per pre-operative assessment  Difficulty Due To: Difficulty was anticipated, Difficult Airway- due to limited oral opening, Difficult Airway- due to large tongue and Difficult Airway- due to dentition

## 2017-09-21 ENCOUNTER — Encounter (HOSPITAL_COMMUNITY): Payer: Self-pay | Admitting: Vascular Surgery

## 2017-09-21 ENCOUNTER — Telehealth: Payer: Self-pay | Admitting: Vascular Surgery

## 2017-09-21 DIAGNOSIS — I120 Hypertensive chronic kidney disease with stage 5 chronic kidney disease or end stage renal disease: Secondary | ICD-10-CM | POA: Diagnosis not present

## 2017-09-21 LAB — BASIC METABOLIC PANEL
Anion gap: 12 (ref 5–15)
BUN: 43 mg/dL — ABNORMAL HIGH (ref 6–20)
CO2: 27 mmol/L (ref 22–32)
Calcium: 8.3 mg/dL — ABNORMAL LOW (ref 8.9–10.3)
Chloride: 96 mmol/L — ABNORMAL LOW (ref 98–111)
Creatinine, Ser: 8.51 mg/dL — ABNORMAL HIGH (ref 0.61–1.24)
GFR calc Af Amer: 7 mL/min — ABNORMAL LOW (ref >60)
GFR calc non Af Amer: 6 mL/min — ABNORMAL LOW (ref >60)
Glucose, Bld: 87 mg/dL (ref 70–99)
Potassium: 5.5 mmol/L — ABNORMAL HIGH (ref 3.5–5.1)
Sodium: 135 mmol/L (ref 135–145)

## 2017-09-21 LAB — CBC
HCT: 35.6 % — ABNORMAL LOW (ref 39.0–52.0)
HCT: 30.5 % — ABNORMAL LOW (ref 39.0–52.0)
Hemoglobin: 11.1 g/dL — ABNORMAL LOW (ref 13.0–17.0)
Hemoglobin: 9.3 g/dL — ABNORMAL LOW (ref 13.0–17.0)
MCH: 30.9 pg (ref 26.0–34.0)
MCH: 30.4 pg (ref 26.0–34.0)
MCHC: 31.2 g/dL (ref 30.0–36.0)
MCHC: 30.5 g/dL (ref 30.0–36.0)
MCV: 99.2 fL (ref 78.0–100.0)
MCV: 99.7 fL (ref 78.0–100.0)
Platelets: 100 10*3/uL — ABNORMAL LOW (ref 150–400)
Platelets: 98 10*3/uL — ABNORMAL LOW (ref 150–400)
RBC: 3.59 MIL/uL — ABNORMAL LOW (ref 4.22–5.81)
RBC: 3.06 MIL/uL — ABNORMAL LOW (ref 4.22–5.81)
RDW: 15.6 % — ABNORMAL HIGH (ref 11.5–15.5)
RDW: 15.9 % — ABNORMAL HIGH (ref 11.5–15.5)
WBC: 6 10*3/uL (ref 4.0–10.5)
WBC: 5.8 10*3/uL (ref 4.0–10.5)

## 2017-09-21 MED ORDER — HEPARIN SODIUM (PORCINE) 1000 UNIT/ML IJ SOLN
INTRAMUSCULAR | Status: AC
  Administered 2017-09-21: 6000 [IU] via INTRAVENOUS
  Filled 2017-09-21: qty 6

## 2017-09-21 MED ORDER — DOXERCALCIFEROL 4 MCG/2ML IV SOLN
INTRAVENOUS | Status: AC
  Administered 2017-09-21: 6 ug via INTRAVENOUS
  Filled 2017-09-21: qty 4

## 2017-09-21 MED ORDER — MIDODRINE HCL 5 MG PO TABS
ORAL_TABLET | ORAL | Status: AC
  Administered 2017-09-21: 10 mg via ORAL
  Filled 2017-09-21: qty 2

## 2017-09-21 MED ORDER — HEPARIN SODIUM (PORCINE) 1000 UNIT/ML IJ SOLN
6000.0000 [IU] | Freq: Once | INTRAMUSCULAR | Status: AC
  Administered 2017-09-21: 6000 [IU] via INTRAVENOUS

## 2017-09-21 NOTE — Care Management (Signed)
1656 09-21-17 Pt asked for cab voucher due to he has his wheelchair and unable to call [facility] to reserve a ride for home. CM spoke to ED Case Manager and she can assist. No further needs from CM at this time. Bethena Roys, RN,BSN Case Manager [phone]

## 2017-09-21 NOTE — Progress Notes (Addendum)
  [facility] Progress Note   Subjective: Seen on HD, 1.7L UF goal - tolerating without issues. BP remains low/decent. He is upset that access plan did not work out - sticking with current [facility] for now.    Objective Vitals:   09/21/17 0930 09/21/17 1000 09/21/17 1030 09/21/17 1100  BP: (!) 99/52 (!) 100/45 (!) 92/42 (!) 94/29  Pulse: 61 60 (!) 59 (!) 59  Resp: (!) 21 11 12 12   Temp:      TempSrc:      SpO2:      Weight:      Height:       Physical Exam General: Well appearing man, NAD. Heart: RRR, 4/6 systolic murmur Lungs: CTA anteriorly Extremities: No LE edema Dialysis Access:  R thigh TDC (through collateral to IVC)  Additional Objective Labs: Basic Metabolic Panel: Recent Labs  Lab 09/20/17 0719 09/20/17 1418 09/21/17 0411  NA 137  --  135  K 4.7  --  5.5*  CL  --   --  96*  CO2  --   --  27  GLUCOSE 89  --  87  BUN  --   --  43*  CREATININE  --  7.33* 8.51*  CALCIUM  --   --  8.3*   CBC: Recent Labs  Lab 09/20/17 0719 09/20/17 1418 09/21/17 0411  WBC  --  6.0 5.8  HGB 13.9 11.1* 9.3*  HCT 41.0 35.6* 30.5*  MCV  --  99.2 99.7  PLT  --  100* 98*   Medications:  . aspirin  325 mg Oral Q1400  . Chlorhexidine Gluconate Cloth  6 each Topical Q0600  . clopidogrel  75 mg Oral Daily  . doxercalciferol      . doxercalciferol  6 mcg Intravenous Q M,W,F-HD  . famotidine  20 mg Oral Q1400  . fluticasone  2 spray Each Nare Daily  . heparin  5,000 Units Subcutaneous Q8H  . levothyroxine  125 mcg Oral QAC breakfast  . midodrine      . midodrine  10 mg Oral Q M,W,F  . pantoprazole  40 mg Oral Daily  . potassium chloride  20-40 mEq Oral Once  . promethazine  25 mg Oral BID  . traZODone  100 mg Oral QHS  . zolpidem  10 mg Oral QHS    Dialysis Orders: MWF [facility] 4:30hr, 450/800, EDW 123.5kg, 2K/2.25Ca, TDC, heparin 3700 bolus - Hectoral 38mcg IV q HD - Parsabiv 5mg  IV q HD (not on [facility] formulary) - Mircera 63mcg IV q 2 weeks (last given  9/13)  Assessment/Plan: 1.  Dialysis access / unsuccessful HeRO AVG attempt: Will continue to use [facility] for now. 2.  ESRD:  Continue HD per MWF schedule, HD today without heparin, 1.7L UF. 3.  Hypertension/volume: BP ok, no edema. 4.  Anemia: Hgb 13.9 - no ESA for now. 5.  Metabolic bone disease: Ca ok , Phos pending, continue home binders. 6.  Dispo: [place] for discharge from renal standpoint.  [name], PA-C 09/21/2017, 11:37 AM  [facility] Pager: [phone]  Pt seen, examined and agree w A/P as above.  [name] MD [employer] pager [phone]   09/21/2017, 11:45 AM

## 2017-09-21 NOTE — Care Management Note (Signed)
Case Management Note  Patient Details  Name: [name] MRN: [id number] Date of Birth: February 22, 1965  Subjective/Objective: ESRD- s/p permanent dialysis access. PTA from home with family support. Pt will have [facility] for PT, OT, RN. [place]  with [facility] is aware to visit the patient on Monday after HD. Per patient he has transportation home.                    Action/Plan: No further needs from CM at this time.  Expected Discharge Date:  09/21/17               Expected Discharge Plan:  [place]  In-House Referral:  NA  Discharge planning Services  CM Consult  Post Acute Care Choice:  Home Health Choice offered to:  Patient  DME Arranged:  N/A DME Agency:  NA  HH Arranged:  RN, PT, OT HH Agency:  [facility]  Status of Service:  Completed, signed off  If discussed at [place] of Stay Meetings, dates discussed:    Additional Comments:  Bethena Roys, RN 09/21/2017, 4:37 PM

## 2017-09-21 NOTE — Telephone Encounter (Signed)
sch appt spk to pt 10/19/17 315pm wound check MD

## 2017-09-21 NOTE — Discharge Summary (Signed)
Physician Discharge Summary   Patient ID: [name] [id number] 51 y.o. Apr 09, 1965  Admit date: 09/20/2017  Discharge date and time: 09/21/17   Admitting Physician: Waynetta Sandy, MD   Discharge Physician: same  Admission Diagnoses: end stage renal disease  Discharge Diagnoses: same  Admission Condition: fair  Discharged Condition: fair  Indication for Admission: Need for permanent dialysis access  Hospital Course: [name] is a 52 year old male with end-stage renal disease on hemodialysis.  He is very limited on dialysis access and is currently dialyzing via right groin TDC.  Placement of right thigh AV graft was attempted by Dr. Donzetta Matters however due to the poor quality of the deep system and right leg and pelvis this was aborted.  He was admitted overnight postoperatively.  Nephrology was consulted for management of end-stage renal disease during hospitalization.  POD #1 right groin incision was unremarkable.  Nephrology arranged for hemodialysis treatment.  Right groin TDC still functional with no complications during hemodialysis.  The patient will be discharged to home.  He will follow-up with Dr. Donzetta Matters for wound check.  If [name] fails only remaining option would be translumbar catheter for hemodialysis.  Discharge instructions were reviewed with the patient and he voices understanding.  The patient has several chronic pain medications on his home list and thus I will not prescribe any additional narcotic pain medication.  He will be discharged this afternoon in stable condition to home.  Consults: nephrology  Treatments: surgery by Dr. Donzetta Matters 09/20/2017 Failed attempt right thigh AV graft  Discharge Exam: See progress note 09/21/2017 Vitals:   09/21/17 1230 09/21/17 1239  BP: (!) 94/45 (!) 103/56  Pulse: 60 (!) 59  Resp: 11 12  Temp:  98.4 F (36.9 C)  SpO2:       Disposition: Discharge disposition: 01-Home or Self Care      Patient Instructions:   Allergies as of 09/21/2017      Reactions   Furadantin [nitrofurantoin] Other (See Comments)   UNSPECIFIED REACTION    Mandelamine [methenamine] Other (See Comments)   UNSPECIFIED REACTION    Noroxin [norfloxacin] Other (See Comments)   UNSPECIFIED REACTION    Carmine Nausea Only   Contrast Media [iodinated Diagnostic Agents] Nausea And Vomiting   Oral dye causes vomiting, IV dye is okay   Metrizamide Nausea And Vomiting   Oral dye causes vomiting, IV dye is okay   Sulfa Antibiotics Cough   Childhood reaction - pt could not confirm that it was a cough   Sulfur Cough   Childhood reaction - pt could not confirm that it was a cough      Medication List    TAKE these medications   aspirin 325 MG tablet Take 325 mg by mouth daily at 2 PM.   calcium acetate 667 MG capsule Commonly known as:  PHOSLO Take 2,001-2,668 mg by mouth See admin instructions. Take 2668 mg by mouth 3 times daily with meals, take 2001 mg by mouth with snacks   clopidogrel 75 MG tablet Commonly known as:  PLAVIX Take 1 tablet (75 mg total) by mouth daily.   famotidine 40 MG tablet Commonly known as:  PEPCID Take 40 mg by mouth daily at 2 PM.   fluticasone 50 MCG/ACT nasal spray Commonly known as:  FLONASE Place 2 sprays into both nostrils daily.   furosemide 80 MG tablet Commonly known as:  LASIX Take 80 mg by mouth daily as needed (for swelling or shortness of breath).   levothyroxine 125 MCG  tablet Commonly known as:  SYNTHROID, LEVOTHROID Take 125 mcg by mouth daily before breakfast.   LORazepam 0.5 MG tablet Commonly known as:  ATIVAN Take 0.5 mg by mouth daily as needed for anxiety.   midodrine 5 MG tablet Commonly known as:  PROAMATINE Take 1 tablet (5 mg total) by mouth 3 (three) times daily with meals. What changed:    how much to take  when to take this   oxyCODONE-acetaminophen 10-325 MG tablet Commonly known as:  PERCOCET Take 1 tablet by mouth 4 (four) times daily as needed  for pain. What changed:  when to take this   promethazine 25 MG tablet Commonly known as:  PHENERGAN Take 25 mg by mouth 2 (two) times daily.   sodium polystyrene powder Commonly known as:  KAYEXALATE Take 15 g by mouth See admin instructions. Take 15 g (mixed in water) by mouth on Saturdays and Sundays   traZODone 100 MG tablet Commonly known as:  DESYREL Take 100 mg by mouth at bedtime.   zolpidem 10 MG tablet Commonly known as:  AMBIEN Take 10 mg by mouth at bedtime.      Activity: activity as tolerated Diet: regular diet Wound Care: keep wound clean and dry  Follow-up with Dr. Donzetta Matters in 3 weeks.  SignedDagoberto Ligas 09/21/2017 2:52 PM

## 2017-09-21 NOTE — Care Management CC44 (Signed)
Condition Code 44 Documentation Completed  Patient Details  Name: [name] MRN: [id number] Date of Birth: [birth year]   Condition Code 44 given:  Yes Patient signature on Condition Code 44 notice:  Yes Documentation of 2 MD's agreement:  Yes Code 44 added to claim:  Yes    Bethena Roys, RN 09/21/2017, 4:56 PM

## 2017-09-21 NOTE — Care Management (Signed)
ED CM brought taxi voucher to [place] handed to Unit Secretary with instructions  to call [facility] when patient is ready.

## 2017-09-21 NOTE — Care Management Obs Status (Signed)
[place] NOTIFICATION   Patient Details  Name: Yoshiharu Brassell MRN: [id number] Date of Birth: May 18, 1965   Medicare Observation Status Notification Given:  Yes    Bethena Roys, RN 09/21/2017, 4:50 PM

## 2017-09-21 NOTE — Progress Notes (Addendum)
  Progress Note    09/21/2017 7:22 AM 1 Day Post-Op  Subjective:  No complaints this morning.  Emotional last night per nursing staff   Vitals:   09/20/17 2349 09/21/17 0449  BP: (!) 101/59 (!) 83/41  Pulse:    Resp:    Temp: 98.7 F (37.1 C) 98.7 F (37.1 C)  SpO2:     Physical Exam: Lungs:  Non labored Incisions:  R groin incision c/d/i Extremities:  Feet symmetrically warm to touch; L cath site unremarkable Abdomen:  Soft  Neurologic: A&O  CBC    Component Value Date/Time   WBC 5.8 09/21/2017 0411   RBC 3.06 (L) 09/21/2017 0411   HGB 9.3 (L) 09/21/2017 0411   HCT 30.5 (L) 09/21/2017 0411   PLT 98 (L) 09/21/2017 0411   MCV 99.7 09/21/2017 0411   MCH 30.4 09/21/2017 0411   MCHC 30.5 09/21/2017 0411   RDW 15.9 (H) 09/21/2017 0411   LYMPHSABS 0.5 (L) 08/14/2017 0111   MONOABS 0.6 08/14/2017 0111   EOSABS 0.3 08/14/2017 0111   BASOSABS 0.0 08/14/2017 0111    BMET    Component Value Date/Time   NA 135 09/21/2017 0411   K 5.5 (H) 09/21/2017 0411   CL 96 (L) 09/21/2017 0411   CO2 27 09/21/2017 0411   GLUCOSE 87 09/21/2017 0411   BUN 43 (H) 09/21/2017 0411   CREATININE 8.51 (H) 09/21/2017 0411   CALCIUM 8.3 (L) 09/21/2017 0411   GFRNONAA 6 (L) 09/21/2017 0411   GFRAA 7 (L) 09/21/2017 0411    INR    Component Value Date/Time   INR 1.05 01/14/2016 1300     Intake/Output Summary (Last 24 hours) at 09/21/2017 1941 Last data filed at 09/20/2017 2238 Gross per 24 hour  Intake 775 ml  Output 750 ml  Net 25 ml     Assessment/Plan:  52 y.o. male is s/p Attempted R thigh AVG 1 Day Post-Op   R groin incision unremarkable; keep incision dry with gauze to be changed daily HD today per Nephrology Possible discharge this afternoon if catheter functioning properly   Dagoberto Ligas, PA-C [facility] [phone] 09/21/2017 7:22 AM  I have independently interviewed and examined patient and agree with PA assessment and plan above.   Incision is healing well.  We will dialyze today as long as catheter is working we will plan for discharge.  His options in the future will be either exchanging this catheter if it has issues which will likely need to be done in an operative setting if this can be done he will only be a candidate for trans-lumbar access.  I discussed this with him.  Emery Binz C. Donzetta Matters, MD [facility] [place] Office: [phone] Pager: [phone]

## 2017-10-03 NOTE — Progress Notes (Addendum)
Chief Complaint  Patient presents with  . New Patient (Initial Visit)   History of Present Illness: [name] with history of HTN, ESRD on HD, GERD, seizure disorder, HTN and hypothyroidism here today as a new consult, referred by Dr. Coletta Memos for pre-operative risk assessment.  He became ESRD in 1997. He has spinal bifida with myelomeningocele and kidney stones. He has no known cardiac disease. He had an echo in February 2019 but not in our system. It appears this was done in [facility]. He has an ileostomy. He has undergone stenting of the left iliac artery in 2018.  He is followed closely by Dr. Donzetta Matters in [facility] with attempts at vascular access. His father had CAD.   He is here today for cardiac risk assessment prior to upcoming vascular surgery. He has no chest pain or dyspnea. He can walk but uses a wheelchair when he goes out of his house. He has no lower extremity edema. No dizziness, near syncope or syncope.   Primary Care Physician: [name], MD  Nephrology: Dr. Moshe Cipro   Past Medical History:  Diagnosis Date  . Anemia   . Anxiety   . Constipation   . End stage renal failure on dialysis [facility])    "[place]; MWF" (02/29/2016)  . GERD (gastroesophageal reflux disease)   . Grand mal seizure ([place]) X 4   last 1998 (02/29/2016)  . Heart murmur    no problems with per pt. - nephrologist and Dr. Coletta Memos  . History of blood transfusion 2000s   "I was having blood loss; never found out from where"  . History of kidney stones   . Hypertension    hx of - has not taken bp meds in over 2 years  . Hypothyroidism   . Insomnia   . Migraine    "due to my BP in my late 1990s; none since" (02/29/2016)  . Nonhealing surgical wound    of the left arteriovenous graft  . Pneumonia    x 2  . Shortness of breath    rarely  . Spina bifida [facility])     Past Surgical History:  Procedure Laterality Date  . APPENDECTOMY    . ARTERIOVENOUS GRAFT PLACEMENT Left 10/16/2012   left  femoral goretex graft         Dr Donnetta Hutching  . AV FISTULA PLACEMENT Left 06/27/2012   Procedure: EXPLORATORY LEFT THY-GRAFT PSEUDO-ANEURYSM;  Surgeon: Conrad Darlington, MD;  Location: [place];  Service: Vascular;  Laterality: Left;  Revision of left Arteriovenus gortex graft in thigh.  . COLONOSCOPY    . I&D EXTREMITY Left 02/29/2016   Procedure: DEBRIDEMENT LEFT THIGH WOUND;  Surgeon: Waynetta Sandy, MD;  Location: [place];  Service: Vascular;  Laterality: Left;  . ILEOSTOMY  1970s  . INCISION AND DRAINAGE Left 10/16/2012   Procedure: INCISION AND Debridement left thigh graft;  Surgeon: Rosetta Posner, MD;  Location: [place];  Service: Vascular;  Laterality: Left;  . INSERTION OF DIALYSIS CATHETER    . INSERTION OF DIALYSIS CATHETER  01/14/2016   Procedure: INSERTION OF DIALYSIS CATHETER;  Surgeon: Waynetta Sandy, MD;  Location: [place];  Service: Vascular;;  . INSERTION OF DIALYSIS CATHETER Right 08/07/2017   Procedure: INSERTION OF DIALYSIS CATHETER;  Surgeon: Waynetta Sandy, MD;  Location: [place];  Service: Vascular;  Laterality: Right;  . INSERTION OF ILIAC STENT Left 01/14/2016   Procedure: INSERTION OF ILIAC STENT;  Surgeon: Waynetta Sandy, MD;  [facility];  Service: Vascular;  Laterality: Left;  . INTRAOPERATIVE ARTERIOGRAM Left 01/14/2016   Procedure: INTRA OPERATIVE ARTERIOGRAM;  Surgeon: Waynetta Sandy, MD;  Location: [place];  Service: Vascular;  Laterality: Left;  . LITHOTRIPSY  x 3   "& a laser treatment" (02/29/2016)  . NEPHROSTOMY Bilateral 1968  . PATELLAR TENDON REPAIR Right 1990s   "big incision"  . PERIPHERAL VASCULAR CATHETERIZATION  01/14/2016   Procedure: A/V SHUNTOGRAM;  Surgeon: Waynetta Sandy, MD;  Location: [place];  Service: Vascular;;  . REVISION OF ARTERIOVENOUS GORETEX GRAFT Left 10/16/2012   Procedure: REVISION OF LEFT FEMORAL LOOP ARTERIOVENOUS GORETEX GRAFT;  Surgeon: Rosetta Posner, MD;  Location: [place];  Service: Vascular;   Laterality: Left;  . REVISION OF ARTERIOVENOUS GORETEX GRAFT Left 02/11/2015   Procedure: EXCISION OF SMALL SEGMENT OF EXPOSED LEFT THIGH NON FUNCTIONING  ARTERIOVENOUS GORETEX GRAFT;  Surgeon: Mal Misty, MD;  Location: [place];  Service: Vascular;  Laterality: Left;  . REVISION OF ARTERIOVENOUS GORETEX GRAFT Left 01/14/2016   Procedure: REVISION OF ARTERIOVENOUS GORETEX GRAFT;  Surgeon: Waynetta Sandy, MD;  Location: [place];  Service: Vascular;  Laterality: Left;  . REVISION OF ARTERIOVENOUS GORETEX GRAFT Left 02/29/2016   thigh/pt report  . REVISION OF ARTERIOVENOUS GORETEX GRAFT Left 02/29/2016   Procedure: POSSIBLE REVISION OF LEFT THIGH ARTERIOVENOUS GORETEX GRAFT;  Surgeon: Waynetta Sandy, MD;  Location: [place];  Service: Vascular;  Laterality: Left;  . THROMBECTOMY W/ EMBOLECTOMY Left 01/14/2016   Procedure: THROMBECTOMY ARTERIOVENOUS GORE-TEX Left thigh GRAFT;  Surgeon: Waynetta Sandy, MD;  Location: [place];  Service: Vascular;  Laterality: Left;  . TONGUE SURGERY  ~ 1990   tongue-tie release   . ULTRASOUND GUIDANCE FOR VASCULAR ACCESS Right 08/07/2017   Procedure: ULTRASOUND GUIDANCE FOR VASCULAR CANNULATION RIGHT FEMORAL VEIN AND LEFT AV FEMORAL GRAFT;  Surgeon: Waynetta Sandy, MD;  Location: [place];  Service: Vascular;  Laterality: Right;  . UPPER EXTREMITY VENOGRAPHY Bilateral 08/09/2017   Procedure: UPPER EXTREMITY VENOGRAPHY;  Surgeon: Serafina Mitchell, MD;  Location: [place] [facility];  Service: Cardiovascular;  Laterality: Bilateral;  . VENOGRAM N/A 09/20/2017   Procedure: RIGHT COMMON FEMORAL ARTERY EXPLORATION.  CANNULATION RIGHT COMMON FEMORAL VEIN. VENOGRAM CENTRAL ULTRA SOUND GUIDED RIGHT FEMORAL VEIN TIMES TWO.;  Surgeon: Waynetta Sandy, MD;  Location: [place];  Service: Vascular;  Laterality: N/A;  . WOUND DEBRIDEMENT Left 02/29/2016   thigh    Current Outpatient Medications  Medication Sig Dispense Refill  . aspirin 325 MG  tablet Take 325 mg by mouth daily at 2 PM.     . calcium acetate (PHOSLO) 667 MG capsule Take 2,001-2,668 mg by mouth See admin instructions. Take 2668 mg by mouth 3 times daily with meals, take 2001 mg by mouth with snacks    . clopidogrel (PLAVIX) 75 MG tablet Take 1 tablet (75 mg total) by mouth daily. 30 tablet 3  . famotidine (PEPCID) 40 MG tablet Take 40 mg by mouth daily at 2 PM.     . fluticasone (FLONASE) 50 MCG/ACT nasal spray Place 2 sprays into both nostrils daily.     . furosemide (LASIX) 80 MG tablet Take 80 mg by mouth daily as needed (for swelling or shortness of breath).     Marland Kitchen levothyroxine (SYNTHROID, LEVOTHROID) 125 MCG tablet Take 125 mcg by mouth daily before breakfast.     . LORazepam (ATIVAN) 0.5 MG tablet Take 0.5 mg by mouth daily as  needed for anxiety.     . midodrine (PROAMATINE) 5 MG tablet Take 1 tablet (5 mg total) by mouth 3 (three) times daily with meals. (Patient taking differently: Take 10 mg by mouth every Monday, Wednesday, and Friday. ) 90 tablet 6  . oxyCODONE-acetaminophen (PERCOCET) 10-325 MG tablet Take 1 tablet by mouth 4 (four) times daily as needed for pain. (Patient taking differently: Take 1 tablet by mouth every 6 (six) hours. ) 30 tablet 0  . promethazine (PHENERGAN) 25 MG tablet Take 25 mg by mouth 2 (two) times daily.     . sodium polystyrene (KAYEXALATE) powder Take 15 g by mouth See admin instructions. Take 15 g (mixed in water) by mouth on Saturdays and Sundays    . traZODone (DESYREL) 100 MG tablet Take 100 mg by mouth at bedtime.    Marland Kitchen zolpidem (AMBIEN) 10 MG tablet Take 10 mg by mouth at bedtime.      No current facility-administered medications for this visit.     Allergies  Allergen Reactions  . Hydrocodone Other (See Comments)    LIKELY NOT REACTION TO HYDROCODONE Pt states that after 3 weeks of taking this medication he will began to "twitch" Pt states that after 3 weeks of taking this medication he will began to "twitch" LIKELY NOT  REACTION TO HYDROCODONE Pt states that after 3 weeks of taking this medication he will began to "twitch" LIKELY NOT REACTION TO HYDROCODONE Pt states that after 3 weeks of taking this medication he will began to "twitch" Pt states that after 3 weeks of taking this medication he will began to "twitch"  . Furadantin [Nitrofurantoin] Other (See Comments)    UNSPECIFIED REACTION   . Mandelamine [Methenamine] Other (See Comments)    UNSPECIFIED REACTION   . Noroxin [Norfloxacin] Other (See Comments)    UNSPECIFIED REACTION   . Carmine Nausea Only  . Contrast Media [Iodinated Diagnostic Agents] Nausea And Vomiting    Oral dye causes vomiting, IV dye is okay  . Metrizamide Nausea And Vomiting    Oral dye causes vomiting, IV dye is okay  . Sulfa Antibiotics Cough    Childhood reaction - pt could not confirm that it was a cough  . Sulfur Cough    Childhood reaction - pt could not confirm that it was a cough    Social History   Socioeconomic History  . Marital status: Single    Spouse name: Not on file  . Number of children: 0  . Years of education: Not on file  . Highest education level: Not on file  Occupational History  . Occupation: Disabled  Social Needs  . Financial resource strain: Not on file  . Food insecurity:    Worry: Not on file    Inability: Not on file  . Transportation needs:    Medical: Not on file    Non-medical: Not on file  Tobacco Use  . Smoking status: Never Smoker  . Smokeless tobacco: Never Used  Substance and Sexual Activity  . Alcohol use: Not Currently    Alcohol/week: 0.0 standard drinks    Comment: 02/29/2016 "nothing since  the mid 1990s  . Drug use: Not Currently    Types: Marijuana    Comment: 02/29/2016 "nothing since ~ 1995"  . Sexual activity: Never  Lifestyle  . Physical activity:    Days per week: Not on file    Minutes per session: Not on file  . Stress: Not on file  Relationships  . Social  connections:    Talks on phone: Not on file     Gets together: Not on file    Attends religious service: Not on file    Active member of club or organization: Not on file    Attends meetings of clubs or organizations: Not on file    Relationship status: Not on file  . Intimate partner violence:    Fear of current or ex partner: Not on file    Emotionally abused: Not on file    Physically abused: Not on file    Forced sexual activity: Not on file  Other Topics Concern  . Not on file  Social History Narrative  . Not on file    Family History  Problem Relation Age of Onset  . Diabetes Mother   . Hypertension Mother   . Heart disease Mother        before age 61  . Diabetes Father   . Heart attack Father        X's 3  . Diabetes Sister   . Bipolar disorder Sister     Review of Systems:  As stated in the HPI and otherwise negative.   BP 110/68   Pulse 66   Ht 5\' 11"  (1.803 m)   Wt 275 lb 12.8 oz (125.1 kg)   SpO2 98%   BMI 38.47 kg/m   Physical Examination: General: Well developed, well nourished, NAD  HEENT: OP clear, mucus membranes moist  SKIN: warm, dry. No rashes. Neuro: No focal deficits  Musculoskeletal: Muscle strength 5/5 all ext  Psychiatric: Mood and affect normal  Neck: No JVD, no carotid bruits, no thyromegaly, no lymphadenopathy.  Lungs:Clear bilaterally, no wheezes, rhonci, crackles Cardiovascular: Regular rate and rhythm. No murmurs, gallops or rubs. Abdomen:Soft. Bowel sounds present. Non-tender.  Extremities: No lower extremity edema. Pulses are 2 + in the bilateral DP/PT.  EKG:  EKG is ordered today. The ekg ordered today demonstrates NSR, rate 66 bpm.   Recent Labs: 12/13/2016: Magnesium 2.4 08/08/2017: ALT 9 09/21/2017: BUN 43; Creatinine, Ser 8.51; Hemoglobin 9.3; Platelets 98; Potassium 5.5; Sodium 135   Lipid Panel No results found for: CHOL, TRIG, HDL, CHOLHDL, VLDL, LDLCALC, LDLDIRECT   Wt Readings from Last 3 Encounters:  10/04/17 275 lb 12.8 oz (125.1 kg)  09/21/17 272 lb 0.8  oz (123.4 kg)  08/27/17 273 lb 9.5 oz (124.1 kg)     Other studies Reviewed: Additional studies/ records that were reviewed today include: hospital records, EKG Review of the above records demonstrates:    Assessment and Plan:   1. Cardiac murmur: He has a systolic heart murmur. He had an echo done in [facility] this summer but the report is unavailable to review in [place]. This echo is not in our system. Will try to get records from Dr. Coletta Memos including the echo report. If we cannot locate an echo, will need to repeat his echo here in our system. (Addendum: Echo obtained from [facility] office. February 2019. Normal LV systolic function with NLGX=21%. Grade 2 diastolic dysfunction. Moderate aortic valve stenosis with mean gradient of 18 mmHg across the aortic valve. Moderate mitral stenosis with mean gradient 6 mmHg.)  2. Cardiovascular risk assessment: He is not known to have CAD. He is overall immobile and confined to a wheelchair most of the time. He has no chest pain or dyspnea. His EKG is normal. I do not think ischemic testing is indicated. He can proceed with his surgical procedure as planned.  3. ESRD on HD    Current medicines are reviewed at length with the patient today.  The patient does not have concerns regarding medicines.  The following changes have been made:  no change  Labs/ tests ordered today include:  No orders of the defined types were placed in this encounter.    Disposition:   FU with me in 12 months   Signed, Lauree Chandler, MD 10/04/2017 10:05 AM    [address], [place], [place]  [address] Phone: [phone]; Fax: [phone]

## 2017-10-04 ENCOUNTER — Ambulatory Visit (INDEPENDENT_AMBULATORY_CARE_PROVIDER_SITE_OTHER): Payer: Medicare Other | Admitting: Cardiovascular Disease

## 2017-10-04 ENCOUNTER — Encounter: Payer: Self-pay | Admitting: Cardiovascular Disease

## 2017-10-04 VITALS — BP 110/68 | HR 66 | Ht 71.0 in | Wt 275.8 lb

## 2017-10-04 DIAGNOSIS — Z01810 Encounter for preprocedural cardiovascular examination: Secondary | ICD-10-CM

## 2017-10-04 DIAGNOSIS — R011 Cardiac murmur, unspecified: Secondary | ICD-10-CM

## 2017-10-04 NOTE — Patient Instructions (Signed)

## 2017-10-09 ENCOUNTER — Telehealth: Payer: Self-pay | Admitting: Cardiovascular Disease

## 2017-10-09 NOTE — Telephone Encounter (Signed)
Records received from Dr.Bouska's office. Placed in chart prep.

## 2017-10-19 ENCOUNTER — Other Ambulatory Visit: Payer: Self-pay

## 2017-10-19 ENCOUNTER — Ambulatory Visit (INDEPENDENT_AMBULATORY_CARE_PROVIDER_SITE_OTHER): Payer: Medicare Other | Admitting: Vascular Surgery

## 2017-10-19 ENCOUNTER — Encounter: Payer: Self-pay | Admitting: Vascular Surgery

## 2017-10-19 VITALS — BP 106/70 | HR 67 | Resp 18 | Ht 71.0 in | Wt 271.6 lb

## 2017-10-19 DIAGNOSIS — N186 End stage renal disease: Secondary | ICD-10-CM

## 2017-10-19 DIAGNOSIS — Z992 Dependence on renal dialysis: Secondary | ICD-10-CM

## 2017-10-19 NOTE — Progress Notes (Signed)
    Subjective:     Patient ID: [name], male   DOB: 05/23/1965, 52 y.o.   MRN: [id number]  HPI 52 year old male follows up after recent exposure right common femoral artery with attempted placement of right femoral AV loop graft that was unsuccessful.  His wound is now healed.  He is currently dialyzing with his tunneled dialysis catheter that was placed through a collateral vein and he says that his flow rates are low.   Review of Systems No real new complaints today.    Objective:   Physical Exam Awake alert and oriented Right groin incision well-healed Tunneled dialysis catheter in place with clean dressing    Assessment:     52 year old male with last ditch effort at permanent access recently attempted.  Currently on dialysis via a right femoral tunneled catheter in a collateral vein that then reaches the IVC.    Plan:     I discussed with him that if this catheter stops working I will attempt to replace it.  The only other option that I would know [name] would be lumbar access.  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  Hopefully there is a similar option for him and that in the interim his tunneled dialysis catheter continues to work.  I am happy to see him on an as-needed basis.  Teonna Coonan C. Donzetta Matters, MD [facility] Office: [phone] Pager: [phone]

## 2017-12-07 ENCOUNTER — Emergency Department (HOSPITAL_COMMUNITY): Payer: Medicare Other

## 2017-12-07 ENCOUNTER — Inpatient Hospital Stay (HOSPITAL_COMMUNITY)
Admission: EM | Admit: 2017-12-07 | Discharge: 2017-12-17 | DRG: 853 | Disposition: A | Discharge status: Skilled Nursing Facility | Payer: Medicare Other | Source: Other Acute Inpatient Hospital | Attending: Internal Medicine | Admitting: Internal Medicine

## 2017-12-07 ENCOUNTER — Inpatient Hospital Stay (HOSPITAL_COMMUNITY): Payer: Medicare Other

## 2017-12-07 ENCOUNTER — Encounter (HOSPITAL_COMMUNITY): Payer: Self-pay

## 2017-12-07 DIAGNOSIS — I739 Peripheral vascular disease, unspecified: Secondary | ICD-10-CM | POA: Diagnosis present

## 2017-12-07 DIAGNOSIS — R509 Fever, unspecified: Secondary | ICD-10-CM | POA: Diagnosis present

## 2017-12-07 DIAGNOSIS — I9589 Other hypotension: Secondary | ICD-10-CM | POA: Diagnosis present

## 2017-12-07 DIAGNOSIS — Y712 Prosthetic and other implants, materials and accessory cardiovascular devices associated with adverse incidents: Secondary | ICD-10-CM | POA: Diagnosis present

## 2017-12-07 DIAGNOSIS — Z833 Family history of diabetes mellitus: Secondary | ICD-10-CM

## 2017-12-07 DIAGNOSIS — E039 Hypothyroidism, unspecified: Secondary | ICD-10-CM | POA: Diagnosis present

## 2017-12-07 DIAGNOSIS — Y838 Other surgical procedures as the cause of abnormal reaction of the patient, or of later complication, without mention of misadventure at the time of the procedure: Secondary | ICD-10-CM | POA: Diagnosis present

## 2017-12-07 DIAGNOSIS — I330 Acute and subacute infective endocarditis: Secondary | ICD-10-CM | POA: Diagnosis not present

## 2017-12-07 DIAGNOSIS — I058 Other rheumatic mitral valve diseases: Secondary | ICD-10-CM | POA: Diagnosis not present

## 2017-12-07 DIAGNOSIS — Z936 Other artificial openings of urinary tract status: Secondary | ICD-10-CM | POA: Diagnosis not present

## 2017-12-07 DIAGNOSIS — R6520 Severe sepsis without septic shock: Secondary | ICD-10-CM | POA: Diagnosis present

## 2017-12-07 DIAGNOSIS — L03032 Cellulitis of left toe: Secondary | ICD-10-CM | POA: Diagnosis not present

## 2017-12-07 DIAGNOSIS — T8241XA Breakdown (mechanical) of vascular dialysis catheter, initial encounter: Secondary | ICD-10-CM | POA: Diagnosis present

## 2017-12-07 DIAGNOSIS — S31000S Unspecified open wound of lower back and pelvis without penetration into retroperitoneum, sequela: Secondary | ICD-10-CM | POA: Diagnosis not present

## 2017-12-07 DIAGNOSIS — M86172 Other acute osteomyelitis, left ankle and foot: Secondary | ICD-10-CM | POA: Diagnosis not present

## 2017-12-07 DIAGNOSIS — Z87442 Personal history of urinary calculi: Secondary | ICD-10-CM

## 2017-12-07 DIAGNOSIS — I872 Venous insufficiency (chronic) (peripheral): Secondary | ICD-10-CM | POA: Diagnosis present

## 2017-12-07 DIAGNOSIS — Z89412 Acquired absence of left great toe: Secondary | ICD-10-CM | POA: Diagnosis not present

## 2017-12-07 DIAGNOSIS — M868X7 Other osteomyelitis, ankle and foot: Secondary | ICD-10-CM | POA: Diagnosis present

## 2017-12-07 DIAGNOSIS — M2170 Unequal limb length (acquired), unspecified site: Secondary | ICD-10-CM | POA: Diagnosis present

## 2017-12-07 DIAGNOSIS — R7881 Bacteremia: Secondary | ICD-10-CM

## 2017-12-07 DIAGNOSIS — Z899 Acquired absence of limb, unspecified: Secondary | ICD-10-CM | POA: Diagnosis not present

## 2017-12-07 DIAGNOSIS — R109 Unspecified abdominal pain: Secondary | ICD-10-CM | POA: Diagnosis not present

## 2017-12-07 DIAGNOSIS — G4700 Insomnia, unspecified: Secondary | ICD-10-CM | POA: Diagnosis present

## 2017-12-07 DIAGNOSIS — M869 Osteomyelitis, unspecified: Secondary | ICD-10-CM

## 2017-12-07 DIAGNOSIS — L02612 Cutaneous abscess of left foot: Secondary | ICD-10-CM | POA: Diagnosis present

## 2017-12-07 DIAGNOSIS — L089 Local infection of the skin and subcutaneous tissue, unspecified: Secondary | ICD-10-CM | POA: Diagnosis not present

## 2017-12-07 DIAGNOSIS — E669 Obesity, unspecified: Secondary | ICD-10-CM | POA: Diagnosis present

## 2017-12-07 DIAGNOSIS — G43909 Migraine, unspecified, not intractable, without status migrainosus: Secondary | ICD-10-CM | POA: Diagnosis present

## 2017-12-07 DIAGNOSIS — L0390 Cellulitis, unspecified: Secondary | ICD-10-CM | POA: Diagnosis present

## 2017-12-07 DIAGNOSIS — Z91041 Radiographic dye allergy status: Secondary | ICD-10-CM | POA: Diagnosis not present

## 2017-12-07 DIAGNOSIS — K219 Gastro-esophageal reflux disease without esophagitis: Secondary | ICD-10-CM | POA: Diagnosis present

## 2017-12-07 DIAGNOSIS — Z79890 Hormone replacement therapy (postmenopausal): Secondary | ICD-10-CM

## 2017-12-07 DIAGNOSIS — I350 Nonrheumatic aortic (valve) stenosis: Secondary | ICD-10-CM | POA: Diagnosis not present

## 2017-12-07 DIAGNOSIS — Z79899 Other long term (current) drug therapy: Secondary | ICD-10-CM

## 2017-12-07 DIAGNOSIS — N186 End stage renal disease: Secondary | ICD-10-CM | POA: Diagnosis present

## 2017-12-07 DIAGNOSIS — Q059 Spina bifida, unspecified: Secondary | ICD-10-CM

## 2017-12-07 DIAGNOSIS — Z7982 Long term (current) use of aspirin: Secondary | ICD-10-CM

## 2017-12-07 DIAGNOSIS — B9561 Methicillin susceptible Staphylococcus aureus infection as the cause of diseases classified elsewhere: Secondary | ICD-10-CM | POA: Diagnosis not present

## 2017-12-07 DIAGNOSIS — Z885 Allergy status to narcotic agent status: Secondary | ICD-10-CM

## 2017-12-07 DIAGNOSIS — Z882 Allergy status to sulfonamides status: Secondary | ICD-10-CM

## 2017-12-07 DIAGNOSIS — A4101 Sepsis due to Methicillin susceptible Staphylococcus aureus: Secondary | ICD-10-CM | POA: Diagnosis present

## 2017-12-07 DIAGNOSIS — M00072 Staphylococcal arthritis, left ankle and foot: Secondary | ICD-10-CM | POA: Diagnosis present

## 2017-12-07 DIAGNOSIS — Z818 Family history of other mental and behavioral disorders: Secondary | ICD-10-CM

## 2017-12-07 DIAGNOSIS — Z888 Allergy status to other drugs, medicaments and biological substances status: Secondary | ICD-10-CM | POA: Diagnosis not present

## 2017-12-07 DIAGNOSIS — I059 Rheumatic mitral valve disease, unspecified: Secondary | ICD-10-CM

## 2017-12-07 DIAGNOSIS — Z91048 Other nonmedicinal substance allergy status: Secondary | ICD-10-CM | POA: Diagnosis not present

## 2017-12-07 DIAGNOSIS — Z7902 Long term (current) use of antithrombotics/antiplatelets: Secondary | ICD-10-CM

## 2017-12-07 DIAGNOSIS — E875 Hyperkalemia: Secondary | ICD-10-CM | POA: Diagnosis present

## 2017-12-07 DIAGNOSIS — D631 Anemia in chronic kidney disease: Secondary | ICD-10-CM | POA: Diagnosis present

## 2017-12-07 DIAGNOSIS — Z7951 Long term (current) use of inhaled steroids: Secondary | ICD-10-CM

## 2017-12-07 DIAGNOSIS — R5383 Other fatigue: Secondary | ICD-10-CM | POA: Diagnosis not present

## 2017-12-07 DIAGNOSIS — Z8701 Personal history of pneumonia (recurrent): Secondary | ICD-10-CM

## 2017-12-07 DIAGNOSIS — N2581 Secondary hyperparathyroidism of renal origin: Secondary | ICD-10-CM | POA: Diagnosis present

## 2017-12-07 DIAGNOSIS — F419 Anxiety disorder, unspecified: Secondary | ICD-10-CM | POA: Diagnosis present

## 2017-12-07 DIAGNOSIS — Z8739 Personal history of other diseases of the musculoskeletal system and connective tissue: Secondary | ICD-10-CM | POA: Diagnosis not present

## 2017-12-07 DIAGNOSIS — Z9049 Acquired absence of other specified parts of digestive tract: Secondary | ICD-10-CM

## 2017-12-07 DIAGNOSIS — G629 Polyneuropathy, unspecified: Secondary | ICD-10-CM | POA: Diagnosis present

## 2017-12-07 DIAGNOSIS — R5081 Fever presenting with conditions classified elsewhere: Secondary | ICD-10-CM

## 2017-12-07 DIAGNOSIS — Z6837 Body mass index (BMI) 37.0-37.9, adult: Secondary | ICD-10-CM

## 2017-12-07 DIAGNOSIS — R011 Cardiac murmur, unspecified: Secondary | ICD-10-CM | POA: Diagnosis not present

## 2017-12-07 DIAGNOSIS — Z992 Dependence on renal dialysis: Secondary | ICD-10-CM | POA: Diagnosis not present

## 2017-12-07 DIAGNOSIS — T8189XS Other complications of procedures, not elsewhere classified, sequela: Secondary | ICD-10-CM

## 2017-12-07 DIAGNOSIS — I4581 Long QT syndrome: Secondary | ICD-10-CM | POA: Diagnosis present

## 2017-12-07 DIAGNOSIS — I050 Rheumatic mitral stenosis: Secondary | ICD-10-CM | POA: Diagnosis present

## 2017-12-07 DIAGNOSIS — Z881 Allergy status to other antibiotic agents status: Secondary | ICD-10-CM | POA: Diagnosis not present

## 2017-12-07 DIAGNOSIS — R5381 Other malaise: Secondary | ICD-10-CM | POA: Diagnosis not present

## 2017-12-07 DIAGNOSIS — Z79891 Long term (current) use of opiate analgesic: Secondary | ICD-10-CM

## 2017-12-07 DIAGNOSIS — Z8249 Family history of ischemic heart disease and other diseases of the circulatory system: Secondary | ICD-10-CM

## 2017-12-07 LAB — CBC WITH DIFFERENTIAL/PLATELET
Abs Immature Granulocytes: 0.04 10*3/uL (ref 0.00–0.07)
Basophils Absolute: 0 10*3/uL (ref 0.0–0.1)
Basophils Relative: 0 %
Eosinophils Absolute: 0 10*3/uL (ref 0.0–0.5)
Eosinophils Relative: 0 %
HCT: 36 % — ABNORMAL LOW (ref 39.0–52.0)
Hemoglobin: 10.9 g/dL — ABNORMAL LOW (ref 13.0–17.0)
Immature Granulocytes: 1 %
Lymphocytes Relative: 3 %
Lymphs Abs: 0.3 10*3/uL — ABNORMAL LOW (ref 0.7–4.0)
MCH: 29.1 pg (ref 26.0–34.0)
MCHC: 30.3 g/dL (ref 30.0–36.0)
MCV: 96.3 fL (ref 80.0–100.0)
Monocytes Absolute: 0.4 10*3/uL (ref 0.1–1.0)
Monocytes Relative: 5 %
Neutro Abs: 7.4 10*3/uL (ref 1.7–7.7)
Neutrophils Relative %: 91 %
Platelets: 92 10*3/uL — ABNORMAL LOW (ref 150–400)
RBC: 3.74 MIL/uL — ABNORMAL LOW (ref 4.22–5.81)
RDW: 16.6 % — ABNORMAL HIGH (ref 11.5–15.5)
WBC: 8.1 10*3/uL (ref 4.0–10.5)
nRBC: 0 % (ref 0.0–0.2)

## 2017-12-07 LAB — COMPREHENSIVE METABOLIC PANEL
ALT: 14 U/L (ref 0–44)
AST: 19 U/L (ref 15–41)
Albumin: 3 g/dL — ABNORMAL LOW (ref 3.5–5.0)
Alkaline Phosphatase: 56 U/L (ref 38–126)
Anion gap: 18 — ABNORMAL HIGH (ref 5–15)
BUN: 39 mg/dL — ABNORMAL HIGH (ref 6–20)
CO2: 24 mmol/L (ref 22–32)
Calcium: 8 mg/dL — ABNORMAL LOW (ref 8.9–10.3)
Chloride: 92 mmol/L — ABNORMAL LOW (ref 98–111)
Creatinine, Ser: 9 mg/dL — ABNORMAL HIGH (ref 0.61–1.24)
GFR calc Af Amer: 7 mL/min — ABNORMAL LOW (ref >60)
GFR calc non Af Amer: 6 mL/min — ABNORMAL LOW (ref >60)
Glucose, Bld: 101 mg/dL — ABNORMAL HIGH (ref 70–99)
Potassium: 4.3 mmol/L (ref 3.5–5.1)
Sodium: 134 mmol/L — ABNORMAL LOW (ref 135–145)
Total Bilirubin: 1.1 mg/dL (ref 0.3–1.2)
Total Protein: 8.3 g/dL — ABNORMAL HIGH (ref 6.5–8.1)

## 2017-12-07 LAB — PROTIME-INR
INR: 1.15
Prothrombin Time: 14.6 seconds (ref 11.4–15.2)

## 2017-12-07 LAB — I-STAT CG4 LACTIC ACID, ED: Lactic Acid, Venous: 1.33 mmol/L (ref 0.5–1.9)

## 2017-12-07 LAB — TROPONIN I: Troponin I: 0.04 ng/mL (ref <0.03)

## 2017-12-07 MED ORDER — HEPARIN SODIUM (PORCINE) 5000 UNIT/ML IJ SOLN
5000.0000 [IU] | Freq: Two times a day (BID) | INTRAMUSCULAR | Status: DC
  Administered 2017-12-07 – 2017-12-09 (×4): 5000 [IU] via SUBCUTANEOUS
  Filled 2017-12-07 (×4): qty 1

## 2017-12-07 MED ORDER — CALCIUM ACETATE (PHOS BINDER) 667 MG PO CAPS
2668.0000 mg | ORAL_CAPSULE | Freq: Three times a day (TID) | ORAL | Status: DC
  Administered 2017-12-08 – 2017-12-17 (×21): 2668 mg via ORAL
  Filled 2017-12-07 (×23): qty 4

## 2017-12-07 MED ORDER — ACETAMINOPHEN 325 MG PO TABS
650.0000 mg | ORAL_TABLET | Freq: Four times a day (QID) | ORAL | Status: DC | PRN
  Administered 2017-12-07 – 2017-12-09 (×4): 650 mg via ORAL
  Filled 2017-12-07 (×3): qty 2

## 2017-12-07 MED ORDER — MIDODRINE HCL 5 MG PO TABS
5.0000 mg | ORAL_TABLET | Freq: Three times a day (TID) | ORAL | Status: DC
  Administered 2017-12-11 – 2017-12-14 (×2): 5 mg via ORAL
  Filled 2017-12-07 (×10): qty 1

## 2017-12-07 MED ORDER — HYDROMORPHONE HCL 2 MG PO TABS
1.0000 mg | ORAL_TABLET | Freq: Once | ORAL | Status: AC
  Administered 2017-12-07: 1 mg via ORAL
  Filled 2017-12-07: qty 1

## 2017-12-07 MED ORDER — SODIUM CHLORIDE 0.9 % IV SOLN
250.0000 mL | INTRAVENOUS | Status: DC | PRN
  Administered 2017-12-08: 250 mL via INTRAVENOUS
  Administered 2017-12-14: 12:00:00 via INTRAVENOUS

## 2017-12-07 MED ORDER — ACETAMINOPHEN 650 MG RE SUPP: 650.0000 mg | Freq: Four times a day (QID) | RECTAL | Status: DC | PRN

## 2017-12-07 MED ORDER — SODIUM CHLORIDE 0.9 % IV SOLN: 2.0000 g | INTRAVENOUS | Status: DC

## 2017-12-07 MED ORDER — VANCOMYCIN HCL IN DEXTROSE 1-5 GM/200ML-% IV SOLN: 1000.0000 mg | INTRAVENOUS | Status: DC

## 2017-12-07 MED ORDER — FLUTICASONE PROPIONATE 50 MCG/ACT NA SUSP
2.0000 | NASAL | Status: DC | PRN
  Filled 2017-12-07: qty 16

## 2017-12-07 MED ORDER — FAMOTIDINE 20 MG PO TABS
40.0000 mg | ORAL_TABLET | Freq: Every day | ORAL | Status: DC
  Administered 2017-12-07: 40 mg via ORAL
  Filled 2017-12-07: qty 2

## 2017-12-07 MED ORDER — ACETAMINOPHEN 500 MG PO TABS
1000.0000 mg | ORAL_TABLET | Freq: Once | ORAL | Status: AC
  Administered 2017-12-07: 1000 mg via ORAL
  Filled 2017-12-07: qty 2

## 2017-12-07 MED ORDER — DOXERCALCIFEROL 4 MCG/2ML IV SOLN
6.0000 ug | INTRAVENOUS | Status: DC
  Administered 2017-12-12 – 2017-12-17 (×3): 6 ug via INTRAVENOUS
  Filled 2017-12-07 (×4): qty 4

## 2017-12-07 MED ORDER — CALCIUM ACETATE 667 MG PO CAPS: 2001.0000 mg | ORAL_CAPSULE | ORAL | Status: DC

## 2017-12-07 MED ORDER — CLOPIDOGREL BISULFATE 75 MG PO TABS
75.0000 mg | ORAL_TABLET | Freq: Every day | ORAL | Status: DC
  Administered 2017-12-07 – 2017-12-17 (×11): 75 mg via ORAL
  Filled 2017-12-07 (×11): qty 1

## 2017-12-07 MED ORDER — SODIUM CHLORIDE 0.9 % IV SOLN
2.0000 g | Freq: Once | INTRAVENOUS | Status: DC
  Filled 2017-12-07: qty 2

## 2017-12-07 MED ORDER — LORAZEPAM 0.5 MG PO TABS: 0.5000 mg | ORAL_TABLET | Freq: Every day | ORAL | Status: DC | PRN

## 2017-12-07 MED ORDER — FUROSEMIDE 80 MG PO TABS: 80.0000 mg | ORAL_TABLET | Freq: Every day | ORAL | Status: DC | PRN

## 2017-12-07 MED ORDER — VANCOMYCIN HCL IN DEXTROSE 1-5 GM/200ML-% IV SOLN
1000.0000 mg | INTRAVENOUS | Status: DC
  Filled 2017-12-07: qty 200

## 2017-12-07 MED ORDER — ONDANSETRON HCL 4 MG PO TABS
4.0000 mg | ORAL_TABLET | Freq: Four times a day (QID) | ORAL | Status: DC | PRN
  Filled 2017-12-07 (×2): qty 1

## 2017-12-07 MED ORDER — ZOLPIDEM TARTRATE 5 MG PO TABS
10.0000 mg | ORAL_TABLET | Freq: Every day | ORAL | Status: DC
  Administered 2017-12-07 – 2017-12-17 (×7): 10 mg via ORAL
  Filled 2017-12-07 (×8): qty 2

## 2017-12-07 MED ORDER — ONDANSETRON HCL 4 MG/2ML IJ SOLN: 4.0000 mg | Freq: Four times a day (QID) | INTRAMUSCULAR | Status: DC | PRN

## 2017-12-07 MED ORDER — LEVOTHYROXINE SODIUM 125 MCG PO TABS
125.0000 ug | ORAL_TABLET | Freq: Every day | ORAL | Status: DC
  Filled 2017-12-07: qty 1

## 2017-12-07 MED ORDER — PROMETHAZINE HCL 25 MG PO TABS
25.0000 mg | ORAL_TABLET | Freq: Two times a day (BID) | ORAL | Status: DC
  Administered 2017-12-07 – 2017-12-10 (×6): 25 mg via ORAL
  Filled 2017-12-07 (×6): qty 1

## 2017-12-07 MED ORDER — SODIUM POLYSTYRENE SULFONATE 15 GM/60ML PO SUSP
15.0000 g | ORAL | Status: DC
  Filled 2017-12-07: qty 60

## 2017-12-07 MED ORDER — LEVOTHYROXINE SODIUM 25 MCG PO TABS
125.0000 ug | ORAL_TABLET | Freq: Every day | ORAL | Status: DC
  Administered 2017-12-08 – 2017-12-17 (×9): 125 ug via ORAL
  Filled 2017-12-07 (×11): qty 1

## 2017-12-07 MED ORDER — SODIUM CHLORIDE 0.9% FLUSH
3.0000 mL | INTRAVENOUS | Status: DC | PRN
  Administered 2017-12-11: 3 mL via INTRAVENOUS
  Filled 2017-12-07: qty 3

## 2017-12-07 MED ORDER — SODIUM CHLORIDE 0.9% FLUSH
3.0000 mL | Freq: Two times a day (BID) | INTRAVENOUS | Status: DC
  Administered 2017-12-07 – 2017-12-17 (×11): 3 mL via INTRAVENOUS

## 2017-12-07 MED ORDER — CHLORHEXIDINE GLUCONATE CLOTH 2 % EX PADS
6.0000 | MEDICATED_PAD | Freq: Every day | CUTANEOUS | Status: DC
  Administered 2017-12-08 – 2017-12-17 (×8): 6 via TOPICAL

## 2017-12-07 MED ORDER — TRAZODONE HCL 100 MG PO TABS
100.0000 mg | ORAL_TABLET | Freq: Every day | ORAL | Status: DC
  Administered 2017-12-07 – 2017-12-17 (×10): 100 mg via ORAL
  Filled 2017-12-07 (×10): qty 1

## 2017-12-07 MED ORDER — SODIUM CHLORIDE 0.9 % IV SOLN
2.0000 g | INTRAVENOUS | Status: DC
  Filled 2017-12-07: qty 2

## 2017-12-07 MED ORDER — CALCIUM ACETATE (PHOS BINDER) 667 MG PO CAPS: 2001.0000 mg | ORAL_CAPSULE | ORAL | Status: DC | PRN

## 2017-12-07 MED ORDER — MORPHINE SULFATE (PF) 2 MG/ML IV SOLN
2.0000 mg | INTRAVENOUS | Status: DC | PRN
  Administered 2017-12-07 – 2017-12-10 (×11): 2 mg via INTRAVENOUS
  Filled 2017-12-07 (×10): qty 1

## 2017-12-07 NOTE — ED Notes (Signed)
Patient transported to X-ray 

## 2017-12-07 NOTE — ED Provider Notes (Signed)
[facility] EMERGENCY DEPARTMENT Provider Note   CSN: [id number] Arrival date & time: 12/07/17  1101     History   Chief Complaint Chief Complaint  Patient presents with  . Fatigue    HPI [name] is a 52 y.o. male.  HPI  52 year old male who is end-stage renal disease on dialysis presents with fever and not feeling well.  Has not been feeling well for about a week.  He has a chronic cough that is unchanged he has been having chest pain during this time.  He is been having a wound to his left great toe with worsening redness.  Chronic left lower extremity swelling and redness that is unchanged.  He had a fever at dialysis that he thinks was 101.5.  No Tylenol was given.  However he was given vancomycin and Fortaz at dialysis.  No vomiting or abdominal pain.  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  He states sometimes it bleeds and drains.  Past Medical History:  Diagnosis Date  . Anemia   . Anxiety   . Constipation   . End stage renal failure on dialysis [facility])    "[place]; MWF" (02/29/2016)  . GERD (gastroesophageal reflux disease)   . Grand mal seizure ([place]) X 4   last 1998 (02/29/2016)  . Heart murmur    no problems with per pt. - nephrologist and Dr. Coletta Memos  . History of blood transfusion 2000s   "I was having blood loss; never found out from where"  . History of kidney stones   . Hypertension    hx of - has not taken bp meds in over 2 years  . Hypothyroidism   . Insomnia   . Migraine    "due to my BP in my late 1990s; none since" (02/29/2016)  . Nonhealing surgical wound    of the left arteriovenous graft  . Pneumonia    x 2  . Shortness of breath    rarely  . Spina bifida [facility])     Patient Active Problem List   Diagnosis Date Noted  . Cellulitis of toe, left   . Fever   . Right flank pain   . ESRD (end stage renal disease) ([place]) 09/20/2017  . ESRD (end stage renal disease) on dialysis ([place])  08/07/2017  . Nonhealing surgical wound 02/29/2016  . Clotted dialysis access ([place]) 01/14/2016  . Removal of staples 07/19/2012  . Other complications due to renal dialysis device, implant, and graft 06/27/2012  . End stage renal disease ([place]) 06/27/2012    Past Surgical History:  Procedure Laterality Date  . APPENDECTOMY    . ARTERIOVENOUS GRAFT PLACEMENT Left 10/16/2012   left femoral goretex graft         Dr Donnetta Hutching  . AV FISTULA PLACEMENT Left 06/27/2012   Procedure: EXPLORATORY LEFT THY-GRAFT PSEUDO-ANEURYSM;  Surgeon: Conrad Moundridge, MD;  Location: [place];  Service: Vascular;  Laterality: Left;  Revision of left Arteriovenus gortex graft in thigh.  . COLONOSCOPY    . I&D EXTREMITY Left 02/29/2016   Procedure: DEBRIDEMENT LEFT THIGH WOUND;  Surgeon: Waynetta Sandy, MD;  Location: [place];  Service: Vascular;  Laterality: Left;  . ILEOSTOMY  1970s  . INCISION AND DRAINAGE Left 10/16/2012   Procedure: INCISION AND Debridement left thigh graft;  Surgeon: Rosetta Posner, MD;  Location: [place];  Service: Vascular;  Laterality: Left;  . INSERTION OF DIALYSIS CATHETER    .  INSERTION OF DIALYSIS CATHETER  01/14/2016   Procedure: INSERTION OF DIALYSIS CATHETER;  Surgeon: Waynetta Sandy, MD;  Location: [place];  Service: Vascular;;  . INSERTION OF DIALYSIS CATHETER Right 08/07/2017   Procedure: INSERTION OF DIALYSIS CATHETER;  Surgeon: Waynetta Sandy, MD;  Location: [place];  Service: Vascular;  Laterality: Right;  . INSERTION OF ILIAC STENT Left 01/14/2016   Procedure: INSERTION OF ILIAC STENT;  Surgeon: Waynetta Sandy, MD;  Location: [place];  Service: Vascular;  Laterality: Left;  . INTRAOPERATIVE ARTERIOGRAM Left 01/14/2016   Procedure: INTRA OPERATIVE ARTERIOGRAM;  Surgeon: Waynetta Sandy, MD;  Location: [place];  Service: Vascular;  Laterality: Left;  . LITHOTRIPSY  x 3   "& a laser treatment" (02/29/2016)  . NEPHROSTOMY Bilateral 1968  . PATELLAR TENDON  REPAIR Right 1990s   "big incision"  . PERIPHERAL VASCULAR CATHETERIZATION  01/14/2016   Procedure: A/V SHUNTOGRAM;  Surgeon: Waynetta Sandy, MD;  Location: [place];  Service: Vascular;;  . REVISION OF ARTERIOVENOUS GORETEX GRAFT Left 10/16/2012   Procedure: REVISION OF LEFT FEMORAL LOOP ARTERIOVENOUS GORETEX GRAFT;  Surgeon: Rosetta Posner, MD;  Location: [place];  Service: Vascular;  Laterality: Left;  . REVISION OF ARTERIOVENOUS GORETEX GRAFT Left 02/11/2015   Procedure: EXCISION OF SMALL SEGMENT OF EXPOSED LEFT THIGH NON FUNCTIONING  ARTERIOVENOUS GORETEX GRAFT;  Surgeon: Mal Misty, MD;  Location: [place];  Service: Vascular;  Laterality: Left;  . REVISION OF ARTERIOVENOUS GORETEX GRAFT Left 01/14/2016   Procedure: REVISION OF ARTERIOVENOUS GORETEX GRAFT;  Surgeon: Waynetta Sandy, MD;  Location: [place];  Service: Vascular;  Laterality: Left;  . REVISION OF ARTERIOVENOUS GORETEX GRAFT Left 02/29/2016   thigh/pt report  . REVISION OF ARTERIOVENOUS GORETEX GRAFT Left 02/29/2016   Procedure: POSSIBLE REVISION OF LEFT THIGH ARTERIOVENOUS GORETEX GRAFT;  Surgeon: Waynetta Sandy, MD;  Location: [place];  Service: Vascular;  Laterality: Left;  . THROMBECTOMY W/ EMBOLECTOMY Left 01/14/2016   Procedure: THROMBECTOMY ARTERIOVENOUS GORE-TEX Left thigh GRAFT;  Surgeon: Waynetta Sandy, MD;  Location: [place];  Service: Vascular;  Laterality: Left;  . TONGUE SURGERY  ~ 1990   tongue-tie release   . ULTRASOUND GUIDANCE FOR VASCULAR ACCESS Right 08/07/2017   Procedure: ULTRASOUND GUIDANCE FOR VASCULAR CANNULATION RIGHT FEMORAL VEIN AND LEFT AV FEMORAL GRAFT;  Surgeon: Waynetta Sandy, MD;  Location: [place];  Service: Vascular;  Laterality: Right;  . UPPER EXTREMITY VENOGRAPHY Bilateral 08/09/2017   Procedure: UPPER EXTREMITY VENOGRAPHY;  Surgeon: Serafina Mitchell, MD;  Location: [place] [facility];  Service: Cardiovascular;  Laterality: Bilateral;  . VENOGRAM N/A 09/20/2017    Procedure: RIGHT COMMON FEMORAL ARTERY EXPLORATION.  CANNULATION RIGHT COMMON FEMORAL VEIN. VENOGRAM CENTRAL ULTRA SOUND GUIDED RIGHT FEMORAL VEIN TIMES TWO.;  Surgeon: Waynetta Sandy, MD;  Location: [place];  Service: Vascular;  Laterality: N/A;  . WOUND DEBRIDEMENT Left 02/29/2016   thigh        Home Medications    Prior to Admission medications   Medication Sig Start Date End Date Taking? Authorizing Provider  aspirin 325 MG tablet Take 325 mg by mouth daily at 2 PM.    Yes [provider]  calcium acetate (PHOSLO) 667 MG capsule Take 2,001-2,708 mg by mouth See admin instructions. Take 2708 mg by mouth 3 times daily with meals, take 2001 mg by mouth with snacks   Yes [provider]  clopidogrel (PLAVIX) 75 MG tablet Take 1 tablet (75 mg total)  by mouth daily. 01/17/16  Yes Alvia Grove, PA-C  famotidine (PEPCID) 40 MG tablet Take 40 mg by mouth daily at 2 PM.    Yes [provider]  fluticasone (FLONASE) 50 MCG/ACT nasal spray Place 2 sprays into both nostrils as needed for allergies or rhinitis (congestion).    Yes [provider]  furosemide (LASIX) 80 MG tablet Take 80 mg by mouth daily as needed (for swelling or shortness of breath).    Yes [provider]  levothyroxine (SYNTHROID, LEVOTHROID) 125 MCG tablet Take 125 mcg by mouth daily before breakfast.    Yes [provider]  LORazepam (ATIVAN) 0.5 MG tablet Take 0.5 mg by mouth daily as needed for anxiety.    Yes [provider]  oxyCODONE-acetaminophen (PERCOCET) 10-325 MG tablet Take 1 tablet by mouth 4 (four) times daily as needed for pain. Patient taking differently: Take 1 tablet by mouth every 6 (six) hours.  03/01/16  Yes Alvia Grove, PA-C  promethazine (PHENERGAN) 25 MG tablet Take 25 mg by mouth 2 (two) times daily.    Yes [provider]  sodium polystyrene (KAYEXALATE) powder Take 15 g by mouth See admin instructions. Take 15 g  (mixed in water) by mouth on Saturdays and Sundays   Yes [provider]  traZODone (DESYREL) 100 MG tablet Take 100 mg by mouth at bedtime.   Yes [provider]  zolpidem (AMBIEN) 10 MG tablet Take 10 mg by mouth at bedtime.    Yes [provider]  midodrine (PROAMATINE) 5 MG tablet Take 1 tablet (5 mg total) by mouth 3 (three) times daily with meals. Patient not taking: Reported on 12/07/2017 01/18/16   Alvia Grove PA-C    Family History Family History  Problem Relation Age of Onset  . Diabetes Mother   . Hypertension Mother   . Heart disease Mother        before age 17  . Diabetes Father   . Heart attack Father        X's 3  . Diabetes Sister   . Bipolar disorder Sister     Social History Social History   Tobacco Use  . Smoking status: Never Smoker  . Smokeless tobacco: Never Used  Substance Use Topics  . Alcohol use: Not Currently    Alcohol/week: 0.0 standard drinks    Comment: 02/29/2016 "nothing since  the mid 1990s  . Drug use: Not Currently    Types: Marijuana    Comment: 02/29/2016 "nothing since ~ 1995"     Allergies   Hydrocodone; Furadantin [nitrofurantoin]; Mandelamine [methenamine]; Noroxin [norfloxacin]; Carmine; Contrast media [iodinated diagnostic agents]; Metrizamide; Sulfa antibiotics; and Sulfur   Review of Systems Review of Systems  Constitutional: Positive for chills and fever.  Respiratory: Negative for cough and shortness of breath.   Cardiovascular: Positive for chest pain.  Gastrointestinal: Negative for abdominal pain and vomiting.  Skin: Positive for wound.  All other systems reviewed and are negative.    Physical Exam Updated Vital Signs BP (!) 116/47   Pulse 80   Temp 98.8 F (37.1 C) (Oral)   Resp 20   Ht 5\' 11"  (1.803 m)   Wt 124 kg   SpO2 98%   BMI 38.13 kg/m   Physical Exam  Constitutional: He appears well-developed and well-nourished. No distress.  HENT:  Head: Normocephalic and  atraumatic.  Right Ear: External ear normal.  Left Ear: External ear normal.  Nose: Nose normal.  Eyes: Right  eye exhibits no discharge. Left eye exhibits no discharge.  Neck: Neck supple.  Cardiovascular: Normal rate, regular rhythm and normal heart sounds.  Pulmonary/Chest: Effort normal and breath sounds normal.  Abdominal: Soft. There is no tenderness.  Musculoskeletal: He exhibits no edema.  Chronic appearing right low back region from prior nephrostomy tube. There is tenderness but no fluctuance, induration or drainage.   See picture for left foot. There is warmth/redness to left lower leg that patient states is chronic. Left toe is tender, warm and red  Neurological: He is alert.  Skin: Skin is warm and dry. He is not diaphoretic.  Psychiatric: His mood appears not anxious.  Nursing note and vitals reviewed.      ED Treatments / Results  Labs (all labs ordered are listed, but only abnormal results are displayed) Labs Reviewed  COMPREHENSIVE METABOLIC PANEL - Abnormal; Notable for the following components:      Result Value   Sodium 134 (*)    Chloride 92 (*)    Glucose, Bld 101 (*)    BUN 39 (*)    Creatinine, Ser 9.00 (*)    Calcium 8.0 (*)    Total Protein 8.3 (*)    Albumin 3.0 (*)    GFR calc non Af Amer 6 (*)    GFR calc Af Amer 7 (*)    Anion gap 18 (*)    All other components within normal limits  CBC WITH DIFFERENTIAL/PLATELET - Abnormal; Notable for the following components:   RBC 3.74 (*)    Hemoglobin 10.9 (*)    HCT 36.0 (*)    RDW 16.6 (*)    Platelets 92 (*)    Lymphs Abs 0.3 (*)    All other components within normal limits  TROPONIN I - Abnormal; Notable for the following components:   Troponin I 0.04 (*)    All other components within normal limits  CULTURE, BLOOD (ROUTINE X 2)  CULTURE, BLOOD (ROUTINE X 2)  CULTURE, BLOOD (ROUTINE X 2)  CULTURE, BLOOD (ROUTINE X 2)  PROTIME-INR  URINALYSIS, ROUTINE W REFLEX MICROSCOPIC  I-STAT CG4  LACTIC ACID, ED  I-STAT CG4 LACTIC ACID, ED    EKG EKG Interpretation  Date/Time:  Friday December 07 2017 11:08:25 EST Ventricular Rate:  84 PR Interval:    QRS Duration: 100 QT Interval:  414 QTC Calculation: 490 R Axis:   83 Text Interpretation:  Sinus rhythm Borderline prolonged QT interval no significant change since 2018 Confirmed by Sherwood Gambler [id number]) on 12/07/2017 11:32:25 AM   Radiology Dg Chest 2 View  Result Date: 12/07/2017 CLINICAL DATA:  52 year old male with a history of fever EXAM: CHEST - 2 VIEW COMPARISON:  08/07/2017 FINDINGS: Cardiomediastinal silhouette unchanged in size and contour. Low lung volumes with coarsened interstitial markings. No pleural effusion. No confluent airspace disease. No pneumothorax. IMPRESSION: Low lung volumes, with either chronic changes or early edema. Electronically Signed   By: Corrie Mckusick D.O.   On: 12/07/2017 11:44   Dg Foot Complete Left  Result Date: 12/07/2017 CLINICAL DATA:  Toe infection. EXAM: LEFT FOOT - COMPLETE 3+ VIEW COMPARISON:  None. FINDINGS: Soft tissue deformity noted of the distal great toe. I do not see plain radiographic evidence of osteomyelitis. There is some chronic post traumatic deformity of the inter phalangeal joint of the great toe. Degenerative changes are noted in the midfoot, possibly neuropathic or post traumatic. IMPRESSION: Soft tissue abnormality of the great toe without plain radiographic evidence of underlying osteomyelitis. Extensive  degenerative changes of the midfoot, possibly neuropathic or post traumatic. Electronically Signed   By: Nelson Chimes M.D.   On: 12/07/2017 12:50    Procedures Procedures (including critical care time)  Medications Ordered in ED Medications  Chlorhexidine Gluconate Cloth 2 % PADS 6 each (has no administration in time range)  doxercalciferol (HECTOROL) injection 6 mcg (has no administration in time range)  acetaminophen (TYLENOL) tablet 1,000 mg (1,000 mg Oral  Given 12/07/17 1236)  HYDROmorphone (DILAUDID) tablet 1 mg (1 mg Oral Given 12/07/17 1516)     Initial Impression / Assessment and Plan / ED Course  I have reviewed the triage vital signs and the nursing notes.  Pertinent labs & imaging results that were available during my care of the patient were reviewed by me and considered in my medical decision making (see chart for details).     Given his fever at dialysis and concern for toe infection, possible osteomyelitis, he will need admission.  He was already given antibiotics at dialysis earlier today.  Initial work-up otherwise benign.  I discussed with the hospitalist who will admit.  He asked for MRI of left foot, which has been ordered.  Final Clinical Impressions(s) / ED Diagnoses   Final diagnoses:  Left foot infection    ED Discharge Orders    None       Sherwood Gambler, MD 12/07/17 [phone]

## 2017-12-07 NOTE — ED Notes (Signed)
Per MRI, pt will be transported in 1-1.5 hours

## 2017-12-07 NOTE — H&P (Addendum)
[facility]                                                                                    Patient Demographics  [name], is a 52 y.o. male  CSN: [id number]  MRN: [id number]  DOB - Jan 22, 1965  Admit Date - 12/07/2017  Outpatient Primary MD for the patient is [name], MD   With History of -  Past Medical History:  Diagnosis Date  . Anemia   . Anxiety   . Constipation   . End stage renal failure on dialysis [facility])    "[place]; MWF" (02/29/2016)  . GERD (gastroesophageal reflux disease)   . Grand mal seizure ([place]) X 4   last 1998 (02/29/2016)  . Heart murmur    no problems with per pt. - nephrologist and Dr. Coletta Memos  . History of blood transfusion 2000s   "I was having blood loss; never found out from where"  . History of kidney stones   . Hypertension    hx of - has not taken bp meds in over 2 years  . Hypothyroidism   . Insomnia   . Migraine    "due to my BP in my late 1990s; none since" (02/29/2016)  . Nonhealing surgical wound    of the left arteriovenous graft  . Pneumonia    x 2  . Shortness of breath    rarely  . Spina bifida [facility])       Past Surgical History:  Procedure Laterality Date  . APPENDECTOMY    . ARTERIOVENOUS GRAFT PLACEMENT Left 10/16/2012   left femoral goretex graft         Dr Donnetta Hutching  . AV FISTULA PLACEMENT Left 06/27/2012   Procedure: EXPLORATORY LEFT THY-GRAFT PSEUDO-ANEURYSM;  Surgeon: Conrad Wheatland, MD;  Location: [place];  Service: Vascular;  Laterality: Left;  Revision of left Arteriovenus gortex graft in thigh.  . COLONOSCOPY    . I&D EXTREMITY Left 02/29/2016   Procedure: DEBRIDEMENT LEFT THIGH WOUND;  Surgeon: Waynetta Sandy, MD;  Location: [place];  Service: Vascular;  Laterality: Left;  . ILEOSTOMY  1970s  . INCISION AND DRAINAGE Left 10/16/2012   Procedure: INCISION AND Debridement left thigh graft;  Surgeon: Rosetta Posner, MD;  Location: [place];  Service: Vascular;  Laterality: Left;  .  INSERTION OF DIALYSIS CATHETER    . INSERTION OF DIALYSIS CATHETER  01/14/2016   Procedure: INSERTION OF DIALYSIS CATHETER;  Surgeon: Waynetta Sandy, MD;  Location: [place];  Service: Vascular;;  . INSERTION OF DIALYSIS CATHETER Right 08/07/2017   Procedure: INSERTION OF DIALYSIS CATHETER;  Surgeon: Waynetta Sandy, MD;  Location: [place];  Service: Vascular;  Laterality: Right;  . INSERTION OF ILIAC STENT Left 01/14/2016   Procedure: INSERTION OF ILIAC STENT;  Surgeon: Waynetta Sandy, MD;  Location: [place];  Service: Vascular;  Laterality: Left;  . INTRAOPERATIVE ARTERIOGRAM Left 01/14/2016   Procedure: INTRA OPERATIVE ARTERIOGRAM;  Surgeon: Waynetta Sandy, MD;  Location: [place];  Service: Vascular;  Laterality: Left;  . LITHOTRIPSY  x 3   "& a laser treatment" (02/29/2016)  . NEPHROSTOMY Bilateral 1968  .  PATELLAR TENDON REPAIR Right 1990s   "big incision"  . PERIPHERAL VASCULAR CATHETERIZATION  01/14/2016   Procedure: A/V SHUNTOGRAM;  Surgeon: Waynetta Sandy, MD;  Location: [place];  Service: Vascular;;  . REVISION OF ARTERIOVENOUS GORETEX GRAFT Left 10/16/2012   Procedure: REVISION OF LEFT FEMORAL LOOP ARTERIOVENOUS GORETEX GRAFT;  Surgeon: Rosetta Posner, MD;  Location: [place];  Service: Vascular;  Laterality: Left;  . REVISION OF ARTERIOVENOUS GORETEX GRAFT Left 02/11/2015   Procedure: EXCISION OF SMALL SEGMENT OF EXPOSED LEFT THIGH NON FUNCTIONING  ARTERIOVENOUS GORETEX GRAFT;  Surgeon: Mal Misty, MD;  Location: [place];  Service: Vascular;  Laterality: Left;  . REVISION OF ARTERIOVENOUS GORETEX GRAFT Left 01/14/2016   Procedure: REVISION OF ARTERIOVENOUS GORETEX GRAFT;  Surgeon: Waynetta Sandy, MD;  Location: [place];  Service: Vascular;  Laterality: Left;  . REVISION OF ARTERIOVENOUS GORETEX GRAFT Left 02/29/2016   thigh/pt report  . REVISION OF ARTERIOVENOUS GORETEX GRAFT Left 02/29/2016   Procedure: POSSIBLE REVISION OF LEFT THIGH  ARTERIOVENOUS GORETEX GRAFT;  Surgeon: Waynetta Sandy, MD;  Location: [place];  Service: Vascular;  Laterality: Left;  . THROMBECTOMY W/ EMBOLECTOMY Left 01/14/2016   Procedure: THROMBECTOMY ARTERIOVENOUS GORE-TEX Left thigh GRAFT;  Surgeon: Waynetta Sandy, MD;  Location: [place];  Service: Vascular;  Laterality: Left;  . TONGUE SURGERY  ~ 1990   tongue-tie release   . ULTRASOUND GUIDANCE FOR VASCULAR ACCESS Right 08/07/2017   Procedure: ULTRASOUND GUIDANCE FOR VASCULAR CANNULATION RIGHT FEMORAL VEIN AND LEFT AV FEMORAL GRAFT;  Surgeon: Waynetta Sandy, MD;  Location: [place];  Service: Vascular;  Laterality: Right;  . UPPER EXTREMITY VENOGRAPHY Bilateral 08/09/2017   Procedure: UPPER EXTREMITY VENOGRAPHY;  Surgeon: Serafina Mitchell, MD;  Location: [place] CV LAB;  Service: Cardiovascular;  Laterality: Bilateral;  . VENOGRAM N/A 09/20/2017   Procedure: RIGHT COMMON FEMORAL ARTERY EXPLORATION.  CANNULATION RIGHT COMMON FEMORAL VEIN. VENOGRAM CENTRAL ULTRA SOUND GUIDED RIGHT FEMORAL VEIN TIMES TWO.;  Surgeon: Waynetta Sandy, MD;  Location: [place];  Service: Vascular;  Laterality: N/A;  . WOUND DEBRIDEMENT Left 02/29/2016   thigh    in for   Chief Complaint  Patient presents with  . Fatigue     HPI  [name]  is a 52 y.o. male, with past medical history significant for end-stage renal disease on hemodialysis who presented with 1 week history of not feeling well with the fever of 102.  Patient denies any chest pains or shortness of breath.  Patient reports a chronic cough.  Is complaining of left great toe pain and redness that has been getting worse.  He was in dialysis when he was found to be febrile at 101.5 and he was started on vancomycin and Fortaz.  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 In the emergency room patient was noted to be afebrile however his blood pressure  was low.  His white blood cell count was 8.1 with a chest x-ray that did not show any acute changes and left foot x-ray that did not show any osteomyelitis.    Review of Systems    In addition to the HPI above,   No Headache, No changes with Vision or hearing, No problems swallowing food or Liquids, No Chest pain, or Shortness of Breath, No Abdominal pain, No Nausea or Vommitting, Bowel movements are regular, No Blood in stool or Urine, No dysuria, No new  skin rashes or bruises,  No new weakness, tingling, numbness in any extremity, No recent weight gain or loss, No polyuria, polydypsia or polyphagia, No significant Mental Stressors.  A full 10 point Review of Systems was done, except as stated above, all other Review of Systems were negative.   Social History Social History   Tobacco Use  . Smoking status: Never Smoker  . Smokeless tobacco: Never Used  Substance Use Topics  . Alcohol use: Not Currently    Alcohol/week: 0.0 standard drinks    Comment: 02/29/2016 "nothing since  the mid 1990s     Family History Family History  Problem Relation Age of Onset  . Diabetes Mother   . Hypertension Mother   . Heart disease Mother        before age 52  . Diabetes Father   . Heart attack Father        X's 3  . Diabetes Sister   . Bipolar disorder Sister      Prior to Admission medications   Medication Sig Start Date End Date Taking? Authorizing Provider  aspirin 325 MG tablet Take 325 mg by mouth daily at 2 PM.    Yes [provider]  calcium acetate (PHOSLO) 667 MG capsule Take 2,001-2,708 mg by mouth See admin instructions. Take 2708 mg by mouth 3 times daily with meals, take 2001 mg by mouth with snacks   Yes [provider]  clopidogrel (PLAVIX) 75 MG tablet Take 1 tablet (75 mg total) by mouth daily. 01/17/16  Yes Alvia Grove, PA-C  famotidine (PEPCID) 40 MG tablet Take 40 mg by mouth daily at 2 PM.    Yes [provider]  fluticasone  (FLONASE) 50 MCG/ACT nasal spray Place 2 sprays into both nostrils as needed for allergies or rhinitis (congestion).    Yes [provider]  furosemide (LASIX) 80 MG tablet Take 80 mg by mouth daily as needed (for swelling or shortness of breath).    Yes [provider]  levothyroxine (SYNTHROID, LEVOTHROID) 125 MCG tablet Take 125 mcg by mouth daily before breakfast.    Yes [provider]  LORazepam (ATIVAN) 0.5 MG tablet Take 0.5 mg by mouth daily as needed for anxiety.    Yes [provider]  oxyCODONE-acetaminophen (PERCOCET) 10-325 MG tablet Take 1 tablet by mouth 4 (four) times daily as needed for pain. Patient taking differently: Take 1 tablet by mouth every 6 (six) hours.  03/01/16  Yes Alvia Grove, PA-C  promethazine (PHENERGAN) 25 MG tablet Take 25 mg by mouth 2 (two) times daily.    Yes [provider]  sodium polystyrene (KAYEXALATE) powder Take 15 g by mouth See admin instructions. Take 15 g (mixed in water) by mouth on Saturdays and Sundays   Yes [provider]  traZODone (DESYREL) 100 MG tablet Take 100 mg by mouth at bedtime.   Yes [provider]  zolpidem (AMBIEN) 10 MG tablet Take 10 mg by mouth at bedtime.    Yes [provider]  midodrine (PROAMATINE) 5 MG tablet Take 1 tablet (5 mg total) by mouth 3 (three) times daily with meals. Patient not taking: Reported on 12/07/2017 01/18/16   Virgina Jock A, PA-C    Allergies  Allergen Reactions  . Hydrocodone Other (See Comments)    LIKELY NOT REACTION TO HYDROCODONE Pt states that after 3 weeks of taking this medication he will began to "twitch" Pt states that after 3 weeks of taking this medication  he will began to "twitch" LIKELY NOT REACTION TO HYDROCODONE Pt states that after 3 weeks of taking this medication he will began to "twitch" LIKELY NOT REACTION TO HYDROCODONE Pt states that after 3 weeks of taking this medication he will began to  "twitch" Pt states that after 3 weeks of taking this medication he will began to "twitch"  . Furadantin [Nitrofurantoin] Other (See Comments)    UNSPECIFIED REACTION   . Mandelamine [Methenamine] Other (See Comments)    UNSPECIFIED REACTION   . Noroxin [Norfloxacin] Other (See Comments)    UNSPECIFIED REACTION   . Carmine Nausea Only  . Contrast Media [Iodinated Diagnostic Agents] Nausea And Vomiting    Oral dye causes vomiting, IV dye is okay  . Metrizamide Nausea And Vomiting    Oral dye causes vomiting, IV dye is okay  . Sulfa Antibiotics Cough    Childhood reaction - pt could not confirm that it was a cough  . Sulfur Cough    Childhood reaction - pt could not confirm that it was a cough    Physical Exam  Vitals  Blood pressure (!) 106/55, pulse 85, temperature 98.8 F (37.1 C), temperature source Oral, resp. rate (!) 22, height 5\' 11"  (1.803 m), weight 124 kg, SpO2 98 %.   1. General Young gentleman, very pleasant no acute distress  2. Normal affect and insight, Not Suicidal or Homicidal, Awake Alert, Oriented X 3.  3. No F.N deficits, grossly, patient moving all extremities.  4. Ears and Eyes appear Normal, Conjunctivae clear, PERRLA. Moist Oral Mucosa.  5. Supple Neck, No JVD, No cervical lymphadenopathy appriciated, No Carotid Bruits.  6. Symmetrical Chest wall movement, Good air movement bilaterally, decreased breath sounds at the bases.  7. RRR, No Gallops, Rubs or Murmurs, No Parasternal Heave.  8. Positive Bowel Sounds, Abdomen Soft, Non tender, left-sided flank wound noted, tender.  9.  No Cyanosis, Normal Skin Turgor, No Skin Rash or Bruise.  10. Good muscle tone,  joints appear normal , no effusions, Normal ROM.    Data Review  CBC Recent Labs  Lab 12/07/17 1115  WBC 8.1  HGB 10.9*  HCT 36.0*  PLT 92*  MCV 96.3  MCH 29.1  MCHC 30.3  RDW 16.6*  LYMPHSABS 0.3*  MONOABS 0.4  EOSABS 0.0  BASOSABS 0.0    ------------------------------------------------------------------------------------------------------------------  Chemistries  Recent Labs  Lab 12/07/17 1115  NA 134*  K 4.3  CL 92*  CO2 24  GLUCOSE 101*  BUN 39*  CREATININE 9.00*  CALCIUM 8.0*  AST 19  ALT 14  ALKPHOS 56  BILITOT 1.1   ------------------------------------------------------------------------------------------------------------------ estimated creatinine clearance is 12.9 mL/min (A) (by C-G formula based on SCr of 9 mg/dL (H)). ------------------------------------------------------------------------------------------------------------------ No results for input(s): TSH, T4TOTAL, T3FREE, THYROIDAB in the last 72 hours.  Invalid input(s): FREET3   Coagulation profile Recent Labs  Lab 12/07/17 1115  INR 1.15   ------------------------------------------------------------------------------------------------------------------- No results for input(s): DDIMER in the last 72 hours. -------------------------------------------------------------------------------------------------------------------  Cardiac Enzymes No results for input(s): CKMB, TROPONINI, MYOGLOBIN in the last 168 hours.  Invalid input(s): CK ------------------------------------------------------------------------------------------------------------------ Invalid input(s): POCBNP   ---------------------------------------------------------------------------------------------------------------  Urinalysis    Component Value Date/Time   COLORURINE RED (A) 07/31/2010 0410   APPEARANCEUR TURBID (A) 07/31/2010 0410   LABSPEC 1.044 (H) 07/31/2010 0410   PHURINE 6.5 07/31/2010 0410   GLUCOSEU 100 (A) 07/31/2010 0410   HGBUR LARGE (A) 07/31/2010 0410   BILIRUBINUR LARGE (A) 07/31/2010 0410   KETONESUR 40 (A) 07/31/2010  0410   PROTEINUR >300 (A) 07/31/2010 0410   UROBILINOGEN 4.0 (H) 07/31/2010 0410   NITRITE POSITIVE (A) 07/31/2010 0410    LEUKOCYTESUR LARGE (A) 07/31/2010 0410    ----------------------------------------------------------------------------------------------------------------   Imaging results:   Dg Chest 2 View  Result Date: 12/07/2017 CLINICAL DATA:  52 year old male with a history of fever EXAM: CHEST - 2 VIEW COMPARISON:  08/07/2017 FINDINGS: Cardiomediastinal silhouette unchanged in size and contour. Low lung volumes with coarsened interstitial markings. No pleural effusion. No confluent airspace disease. No pneumothorax. IMPRESSION: Low lung volumes, with either chronic changes or early edema. Electronically Signed   By: Corrie Mckusick D.O.   On: 12/07/2017 11:44   Dg Foot Complete Left  Result Date: 12/07/2017 CLINICAL DATA:  Toe infection. EXAM: LEFT FOOT - COMPLETE 3+ VIEW COMPARISON:  None. FINDINGS: Soft tissue deformity noted of the distal great toe. I do not see plain radiographic evidence of osteomyelitis. There is some chronic post traumatic deformity of the inter phalangeal joint of the great toe. Degenerative changes are noted in the midfoot, possibly neuropathic or post traumatic. IMPRESSION: Soft tissue abnormality of the great toe without plain radiographic evidence of underlying osteomyelitis. Extensive degenerative changes of the midfoot, possibly neuropathic or post traumatic. Electronically Signed   By: Nelson Chimes M.D.   On: 12/07/2017 12:50      Assessment & Plan  Fever NOS Started on vancomycin and Fortaz at hemodialysis Continue with IV antibiotics Source may be left toe cellulitis versus right flank process that is brewing  Left foot toe cellulitis with negative x-ray changes of osteomyelitis Check MRI of the first toe  Right flank tenderness status post right nephrostomy tube in the past Check CT of abdomen with contrast  End-stage renal disease on hemodialysis Consulted nephrology Dr. Posey Pronto Patient insisted on regular diet which we will provide  Pain Patient is  allergic to hydrocodone but he cannot tolerate OxyIR Start OxyIR and morphine as needed  Hypothyroidism Continue with Synthroid  GERD Pepcid  DVT Prophylaxis Heparin   AM Labs Ordered, also please review Full Orders    Code Status full  Disposition Plan: Home  Time spent in minutes : 42 minutes  Condition GUARDED   @SIGNATURE @

## 2017-12-07 NOTE — ED Notes (Signed)
Pt states he makes very little urine

## 2017-12-07 NOTE — ED Notes (Signed)
Patient transported to MRI 

## 2017-12-07 NOTE — ED Notes (Signed)
Pt given food and beverage per Dr. Wilson Singer

## 2017-12-07 NOTE — Progress Notes (Signed)
Pharmacy Antibiotic Note  [name] is a 52 y.o. male admitted from HD center on 12/07/2017 with fever and fatigue.  Per patient, he was given vancomycin 2gm IV and Fortaz 2gm IV empirically at the HD center.  Pharmacy has been consulted to continue antibiotics here for wound infection/cellulitis.  Imaging negative for osteomyelitis.    Per Renal, patient will receive an additional HD session tomorrow; usual schedule is MWF.  Now afebrile, WBC WNL, LA 1.33.  Plan: Vanc 1gm IV qHD MWF, start 12/08/17 [name] 2gm IV qHD MWF, start 12/05/17 Monitor HD schedule/tolerance, clinical progress, vanc level as indicated  Height: 5\' 11"  (180.3 cm) Weight: 273 lb 5.9 oz (124 kg) IBW/kg (Calculated) : 75.3  Temp (24hrs), Avg:98.8 F (37.1 C), Min:98.8 F (37.1 C), Max:98.8 F (37.1 C)  Recent Labs  Lab 12/07/17 1115 12/07/17 1130  WBC 8.1  --   CREATININE 9.00*  --   LATICACIDVEN  --  1.33    Estimated Creatinine Clearance: 12.9 mL/min (A) (by C-G formula based on SCr of 9 mg/dL (H)).    Allergies  Allergen Reactions  . Hydrocodone Other (See Comments)    LIKELY NOT REACTION TO HYDROCODONE Pt states that after 3 weeks of taking this medication he will began to "twitch" Pt states that after 3 weeks of taking this medication he will began to "twitch" LIKELY NOT REACTION TO HYDROCODONE Pt states that after 3 weeks of taking this medication he will began to "twitch" LIKELY NOT REACTION TO HYDROCODONE Pt states that after 3 weeks of taking this medication he will began to "twitch" Pt states that after 3 weeks of taking this medication he will began to "twitch"  . Furadantin [Nitrofurantoin] Other (See Comments)    UNSPECIFIED REACTION   . Mandelamine [Methenamine] Other (See Comments)    UNSPECIFIED REACTION   . Noroxin [Norfloxacin] Other (See Comments)    UNSPECIFIED REACTION   . Carmine Nausea Only  . Contrast Media [Iodinated Diagnostic Agents] Nausea And Vomiting    Oral dye  causes vomiting, IV dye is okay  . Metrizamide Nausea And Vomiting    Oral dye causes vomiting, IV dye is okay  . Sulfa Antibiotics Cough    Childhood reaction - pt could not confirm that it was a cough  . Sulfur Cough    Childhood reaction - pt could not confirm that it was a cough    Vanc 12/6 >> [name] 12/6 >>  12/6 BCx -    [name], PharmD, BCPS, [place] 12/07/2017, 10:08 PM

## 2017-12-07 NOTE — Progress Notes (Signed)
[facility] Renal Consultation Note    Indication for Consultation:  Management of ESRD/hemodialysis; anemia, hypertension/volume and secondary hyperparathyroidism  DDU:KGURKY, Shanon Brow, MD  HPI: [name] is a 52 y.o. male. ESRD on HD MWF at [facility], first starting in 1997.  Past medical history significant for spinal bifida, seizures, migranes, hypertension now hypotension, obesity, GERD, nephrolithiasis, hypothyroidism, and anxiety.   Patient was sent to ED from following dialysis due to fever with suspected infectious origin.  He completed 3.5hrs of dialysis today, where BCx2 were drawn and he received 2g Fortaz and 2g Vancomycin.  Patient states he has had fever and nausea x 1 week, with max temp today at 102.  Admits to R sided flank/back pain x 1 week and a painful blister on L great toe that has been present for several weeks.  States he has not seen a podiatrist or provider about his toe because it had gotten better at one point.  Unable to pull fluid with HD today due to hypotension.  Admits to occasional SOB.  Denies edema, CP, vomiting, and diarrhea.  Per OP provider note, he has refused midodrine due to headaches.   Pertinent findings in the ED include blister on L great toe with erythema extending up left calf,  erythema and purulent drainage surrounding wound of old nephrostomy site, hypotension, CXR with no acute findings and X ray left foot with no radiographic evidence of osteo.  Patient has been admitted for further evaluation and management.    Past Medical History:  Diagnosis Date  . Anemia   . Anxiety   . Constipation   . End stage renal failure on dialysis [facility])    "[place]; MWF" (02/29/2016)  . GERD (gastroesophageal reflux disease)   . Grand mal seizure ([place]) X 4   last 1998 (02/29/2016)  . Heart murmur    no problems with per pt. - nephrologist and Dr. Coletta Memos  . History of blood transfusion 2000s   "I was having blood loss;  never found out from where"  . History of kidney stones   . Hypertension    hx of - has not taken bp meds in over 2 years  . Hypothyroidism   . Insomnia   . Migraine    "due to my BP in my late 1990s; none since" (02/29/2016)  . Nonhealing surgical wound    of the left arteriovenous graft  . Pneumonia    x 2  . Shortness of breath    rarely  . Spina bifida [facility])    Past Surgical History:  Procedure Laterality Date  . APPENDECTOMY    . ARTERIOVENOUS GRAFT PLACEMENT Left 10/16/2012   left femoral goretex graft         Dr Donnetta Hutching  . AV FISTULA PLACEMENT Left 06/27/2012   Procedure: EXPLORATORY LEFT THY-GRAFT PSEUDO-ANEURYSM;  Surgeon: Conrad Cross Lanes, MD;  Location: [place];  Service: Vascular;  Laterality: Left;  Revision of left Arteriovenus gortex graft in thigh.  . COLONOSCOPY    . I&D EXTREMITY Left 02/29/2016   Procedure: DEBRIDEMENT LEFT THIGH WOUND;  Surgeon: Waynetta Sandy, MD;  Location: [place];  Service: Vascular;  Laterality: Left;  . ILEOSTOMY  1970s  . INCISION AND DRAINAGE Left 10/16/2012   Procedure: INCISION AND Debridement left thigh graft;  Surgeon: Rosetta Posner, MD;  Location: [place];  Service: Vascular;  Laterality: Left;  . INSERTION OF DIALYSIS CATHETER    . INSERTION OF DIALYSIS CATHETER  01/14/2016   Procedure: INSERTION OF DIALYSIS CATHETER;  Surgeon: Waynetta Sandy, MD;  Location: [place];  Service: Vascular;;  . INSERTION OF DIALYSIS CATHETER Right 08/07/2017   Procedure: INSERTION OF DIALYSIS CATHETER;  Surgeon: Waynetta Sandy, MD;  Location: [place];  Service: Vascular;  Laterality: Right;  . INSERTION OF ILIAC STENT Left 01/14/2016   Procedure: INSERTION OF ILIAC STENT;  Surgeon: Waynetta Sandy, MD;  Location: [place];  Service: Vascular;  Laterality: Left;  . INTRAOPERATIVE ARTERIOGRAM Left 01/14/2016   Procedure: INTRA OPERATIVE ARTERIOGRAM;  Surgeon: Waynetta Sandy, MD;  Location: [place];  Service: Vascular;  Laterality:  Left;  . LITHOTRIPSY  x 3   "& a laser treatment" (02/29/2016)  . NEPHROSTOMY Bilateral 1968  . PATELLAR TENDON REPAIR Right 1990s   "big incision"  . PERIPHERAL VASCULAR CATHETERIZATION  01/14/2016   Procedure: A/V SHUNTOGRAM;  Surgeon: Waynetta Sandy, MD;  Location: [place];  Service: Vascular;;  . REVISION OF ARTERIOVENOUS GORETEX GRAFT Left 10/16/2012   Procedure: REVISION OF LEFT FEMORAL LOOP ARTERIOVENOUS GORETEX GRAFT;  Surgeon: Rosetta Posner, MD;  Location: [place];  Service: Vascular;  Laterality: Left;  . REVISION OF ARTERIOVENOUS GORETEX GRAFT Left 02/11/2015   Procedure: EXCISION OF SMALL SEGMENT OF EXPOSED LEFT THIGH NON FUNCTIONING  ARTERIOVENOUS GORETEX GRAFT;  Surgeon: Mal Misty, MD;  Location: [place];  Service: Vascular;  Laterality: Left;  . REVISION OF ARTERIOVENOUS GORETEX GRAFT Left 01/14/2016   Procedure: REVISION OF ARTERIOVENOUS GORETEX GRAFT;  Surgeon: Waynetta Sandy, MD;  Location: [place];  Service: Vascular;  Laterality: Left;  . REVISION OF ARTERIOVENOUS GORETEX GRAFT Left 02/29/2016   thigh/pt report  . REVISION OF ARTERIOVENOUS GORETEX GRAFT Left 02/29/2016   Procedure: POSSIBLE REVISION OF LEFT THIGH ARTERIOVENOUS GORETEX GRAFT;  Surgeon: Waynetta Sandy, MD;  Location: [place];  Service: Vascular;  Laterality: Left;  . THROMBECTOMY W/ EMBOLECTOMY Left 01/14/2016   Procedure: THROMBECTOMY ARTERIOVENOUS GORE-TEX Left thigh GRAFT;  Surgeon: Waynetta Sandy, MD;  Location: [place];  Service: Vascular;  Laterality: Left;  . TONGUE SURGERY  ~ 1990   tongue-tie release   . ULTRASOUND GUIDANCE FOR VASCULAR ACCESS Right 08/07/2017   Procedure: ULTRASOUND GUIDANCE FOR VASCULAR CANNULATION RIGHT FEMORAL VEIN AND LEFT AV FEMORAL GRAFT;  Surgeon: Waynetta Sandy, MD;  Location: [place];  Service: Vascular;  Laterality: Right;  . UPPER EXTREMITY VENOGRAPHY Bilateral 08/09/2017   Procedure: UPPER EXTREMITY VENOGRAPHY;  Surgeon: Serafina Mitchell,  MD;  Location: [place] CV [facility];  Service: Cardiovascular;  Laterality: Bilateral;  . VENOGRAM N/A 09/20/2017   Procedure: RIGHT COMMON FEMORAL ARTERY EXPLORATION.  CANNULATION RIGHT COMMON FEMORAL VEIN. VENOGRAM CENTRAL ULTRA SOUND GUIDED RIGHT FEMORAL VEIN TIMES TWO.;  Surgeon: Waynetta Sandy, MD;  Location: [facility];  Service: Vascular;  Laterality: N/A;  . WOUND DEBRIDEMENT Left 02/29/2016   thigh   Family History  Problem Relation Age of Onset  . Diabetes Mother   . Hypertension Mother   . Heart disease Mother        before age 70  . Diabetes Father   . Heart attack Father        X's 3  . Diabetes Sister   . Bipolar disorder Sister    Social History:  reports that he has never smoked. He has never used smokeless tobacco. He reports that he drank alcohol. He reports that he has current or past drug history. Drug: Marijuana. Allergies  Allergen  Reactions  . Hydrocodone Other (See Comments)    LIKELY NOT REACTION TO HYDROCODONE Pt states that after 3 weeks of taking this medication he will began to "twitch" Pt states that after 3 weeks of taking this medication he will began to "twitch" LIKELY NOT REACTION TO HYDROCODONE Pt states that after 3 weeks of taking this medication he will began to "twitch" LIKELY NOT REACTION TO HYDROCODONE Pt states that after 3 weeks of taking this medication he will began to "twitch" Pt states that after 3 weeks of taking this medication he will began to "twitch"  . Furadantin [Nitrofurantoin] Other (See Comments)    UNSPECIFIED REACTION   . Mandelamine [Methenamine] Other (See Comments)    UNSPECIFIED REACTION   . Noroxin [Norfloxacin] Other (See Comments)    UNSPECIFIED REACTION   . [place]ine Nausea Only  . Contrast Media [Iodinated Diagnostic Agents] Nausea And Vomiting    Oral dye causes vomiting, IV dye is okay  . Metrizamide Nausea And Vomiting    Oral dye causes vomiting, IV dye is okay  . Sulfa Antibiotics Cough    Childhood  reaction - pt could not confirm that it was a cough  . Sulfur Cough    Childhood reaction - pt could not confirm that it was a cough   Prior to Admission medications   Medication Sig Start Date End Date Taking? Authorizing Provider  aspirin 325 MG tablet Take 325 mg by mouth daily at 2 PM.    Yes [provider]  calcium acetate (PHOSLO) 667 MG capsule Take 2,001-2,708 mg by mouth See admin instructions. Take 2708 mg by mouth 3 times daily with meals, take 2001 mg by mouth with snacks   Yes [provider]  clopidogrel (PLAVIX) 75 MG tablet Take 1 tablet (75 mg total) by mouth daily. 01/17/16  Yes Alvia Grove, PA-C  famotidine (PEPCID) 40 MG tablet Take 40 mg by mouth daily at 2 PM.    Yes [provider]  fluticasone (FLONASE) 50 MCG/ACT nasal spray Place 2 sprays into both nostrils as needed for allergies or rhinitis (congestion).    Yes [provider]  furosemide (LASIX) 80 MG tablet Take 80 mg by mouth daily as needed (for swelling or shortness of breath).    Yes [provider]  levothyroxine (SYNTHROID, LEVOTHROID) 125 MCG tablet Take 125 mcg by mouth daily before breakfast.    Yes [provider]  LORazepam (ATIVAN) 0.5 MG tablet Take 0.5 mg by mouth daily as needed for anxiety.    Yes [provider]  oxyCODONE-acetaminophen (PERCOCET) 10-325 MG tablet Take 1 tablet by mouth 4 (four) times daily as needed for pain. Patient taking differently: Take 1 tablet by mouth every 6 (six) hours.  03/01/16  Yes Alvia Grove, PA-C  promethazine (PHENERGAN) 25 MG tablet Take 25 mg by mouth 2 (two) times daily.    Yes [provider]  sodium polystyrene (KAYEXALATE) powder Take 15 g by mouth See admin instructions. Take 15 g (mixed in water) by mouth on Saturdays and Sundays   Yes [provider]  traZODone (DESYREL) 100 MG tablet Take 100 mg by mouth at bedtime.   Yes [provider]  zolpidem (AMBIEN)  10 MG tablet Take 10 mg by mouth at bedtime.    Yes [provider]  midodrine (PROAMATINE) 5 MG tablet Take 1 tablet (5 mg total) by mouth 3 (three) times daily with meals. Patient not taking: Reported on 12/07/2017 01/18/16  Alvia Grove, PA-C   Current Facility-Administered Medications  Medication Dose Route Frequency Provider Last Rate Last Dose  . HYDROmorphone (DILAUDID) tablet 1 mg  1 mg Oral Once Elmarie Shiley, MD       Current Outpatient Medications  Medication Sig Dispense Refill  . aspirin 325 MG tablet Take 325 mg by mouth daily at 2 PM.     . calcium acetate (PHOSLO) 667 MG capsule Take 2,001-2,708 mg by mouth See admin instructions. Take 2708 mg by mouth 3 times daily with meals, take 2001 mg by mouth with snacks    . clopidogrel (PLAVIX) 75 MG tablet Take 1 tablet (75 mg total) by mouth daily. 30 tablet 3  . famotidine (PEPCID) 40 MG tablet Take 40 mg by mouth daily at 2 PM.     . fluticasone (FLONASE) 50 MCG/ACT nasal spray Place 2 sprays into both nostrils as needed for allergies or rhinitis (congestion).     . furosemide (LASIX) 80 MG tablet Take 80 mg by mouth daily as needed (for swelling or shortness of breath).     Marland Kitchen levothyroxine (SYNTHROID, LEVOTHROID) 125 MCG tablet Take 125 mcg by mouth daily before breakfast.     . LORazepam (ATIVAN) 0.5 MG tablet Take 0.5 mg by mouth daily as needed for anxiety.     Marland Kitchen oxyCODONE-acetaminophen (PERCOCET) 10-325 MG tablet Take 1 tablet by mouth 4 (four) times daily as needed for pain. (Patient taking differently: Take 1 tablet by mouth every 6 (six) hours. ) 30 tablet 0  . promethazine (PHENERGAN) 25 MG tablet Take 25 mg by mouth 2 (two) times daily.     . sodium polystyrene (KAYEXALATE) powder Take 15 g by mouth See admin instructions. Take 15 g (mixed in water) by mouth on Saturdays and Sundays    . traZODone (DESYREL) 100 MG tablet Take 100 mg by mouth at bedtime.    Marland Kitchen zolpidem (AMBIEN) 10 MG tablet Take 10 mg by mouth at  bedtime.     . midodrine (PROAMATINE) 5 MG tablet Take 1 tablet (5 mg total) by mouth 3 (three) times daily with meals. (Patient not taking: Reported on 12/07/2017) 90 tablet 6   Labs: Basic Metabolic Panel: Recent Labs  Lab 12/07/17 1115  NA 134*  K 4.3  CL 92*  CO2 24  GLUCOSE 101*  BUN 39*  CREATININE 9.00*  CALCIUM 8.0*   Liver Function Tests: Recent Labs  Lab 12/07/17 1115  AST 19  ALT 14  ALKPHOS 56  BILITOT 1.1  PROT 8.3*  ALBUMIN 3.0*   CBC: Recent Labs  Lab 12/07/17 1115  WBC 8.1  NEUTROABS 7.4  HGB 10.9*  HCT 36.0*  MCV 96.3  PLT 92*   Cardiac Enzymes: Recent Labs  Lab 12/07/17 1243  TROPONINI 0.04*   Studies/Results: Dg Chest 2 View  Result Date: 12/07/2017 CLINICAL DATA:  52 year old male with a history of fever EXAM: CHEST - 2 VIEW COMPARISON:  08/07/2017 FINDINGS: Cardiomediastinal silhouette unchanged in size and contour. Low lung volumes with coarsened interstitial markings. No pleural effusion. No confluent airspace disease. No pneumothorax. IMPRESSION: Low lung volumes, with either chronic changes or early edema. Electronically Signed   By: Corrie Mckusick D.O.   On: 12/07/2017 11:44   Dg Foot Complete Left  Result Date: 12/07/2017 CLINICAL DATA:  Toe infection. EXAM: LEFT FOOT - COMPLETE 3+ VIEW COMPARISON:  None. FINDINGS: Soft tissue deformity noted of the distal great toe. I do not see plain radiographic evidence of osteomyelitis.  There is some chronic post traumatic deformity of the inter phalangeal joint of the great toe. Degenerative changes are noted in the midfoot, possibly neuropathic or post traumatic. IMPRESSION: Soft tissue abnormality of the great toe without plain radiographic evidence of underlying osteomyelitis. Extensive degenerative changes of the midfoot, possibly neuropathic or post traumatic. Electronically Signed   By: [place] Chimes M.D.   On: 12/07/2017 12:50    ROS: All others negative except those listed in  HPI.  Physical Exam: Vitals:   12/07/17 1145 12/07/17 1230 12/07/17 1300 12/07/17 1405  BP: 106/63 (!) 115/56 (!) 108/57 (!) 106/55  Pulse: 84 84 85 85  Resp: 14 19 18  (!) 22  Temp:      TempSrc:      SpO2: 97% 100% 98% 98%  Weight:      Height:         General: WDWN, NAD, chronically ill appearing male Head: NCAT sclera not icteric MMM  Neck: Supple. No lymphadenopathy. No JVD Lungs: CTA bilaterally. No wheeze, rales or rhonchi. Breathing is unlabored. Heart: RRR. +8/3 systolic murmur, No rubs or gallops.  Abdomen: soft, nontender, +BS, no guarding, no rebound tenderness. +wound on R flank with surrounding erythema, and minimal purulent drainage  M/S:  Equal strength b/l in upper and lower extremities.  Lower extremities:no edema, +blister on lateral aspect of L great toe with ischemic changes Neuro: AAOx3. Moves all extremities spontaneously. Psych:  Responds to questions appropriately with a normal affect. Dialysis Access: R thigh TDC  Dialysis Orders:  MWF - [facility]  4.5hrs, BFR 450, DFR 800,  EDW 122.5kg, 2K/ 2.25Ca  Access: R thigh AVG  Heparin 3700 unit bolus Parsabiv 5mg  qHD Hectorol 4mcg IV qHD   Assessment/Plan: 1.  Fever - CXR with no acute process. possible sources include wound near nephrostomy site, possible cellulitis on LLE, or possibly TDC but less likely with other potential sources.  BC drawn here and at HD.  Received 2g vanc and 2g Fortaz at HD.  2. Foot Ulcer/LLE cellulitis - xray negative for osteo. Per primary 3. R flank wound near old nephrostomy site - CT ordered.  ABX continued. 4.  ESRD -  On HD MWF. K 4.3. Completed 3.5hrs of HD today with no net volume removed.  Will write orders for HD tomorrow.  5.  Hypotension/volume  - BP chronically low. On midodrine with HD. If weights correct only 1.5L over EDW.  Does not appear volume overloaded.  Plan for shortened HD tomorrow for volume removal.   6.  Anemia of CKD - Hgb 10.9. Not on ESA, no indication to  start at this time 7.  Secondary Hyperparathyroidism -  Ca 8.0. Continue VDRA and binders.  Unable to get parsabiv because not available in hospital 8.  Nutrition - Renal diet w/fluid restrictions. Renavite.  9. Hx seizures - per primary 10. GERD 11. migraines   Jen Mow, PA-C [facility] Pager: [phone] 12/07/2017, 3:04 PM

## 2017-12-07 NOTE — ED Notes (Signed)
Pt returned to room from MRI.

## 2017-12-07 NOTE — ED Triage Notes (Signed)
Pt coming from dialysis (M, W, F; access R upper thigh) via ems; did not finish dialysis; per staff, pt more fatigued than normal; pt states he had an episode diarrhea night before Thanksgiving; no diarrhea since, but pt states he has been feeling fatigued and nauseated since; pt given 2g vancomycin, 2g fortaz at dialysis; wound on L great toe, wound r lower back at old nephrostomy site  98/68 CBG 101 98% RA P 80 RR 18

## 2017-12-08 ENCOUNTER — Other Ambulatory Visit: Payer: Self-pay

## 2017-12-08 ENCOUNTER — Inpatient Hospital Stay (HOSPITAL_COMMUNITY): Payer: Medicare Other

## 2017-12-08 DIAGNOSIS — Z91048 Other nonmedicinal substance allergy status: Secondary | ICD-10-CM

## 2017-12-08 DIAGNOSIS — R7881 Bacteremia: Secondary | ICD-10-CM

## 2017-12-08 DIAGNOSIS — Z936 Other artificial openings of urinary tract status: Secondary | ICD-10-CM

## 2017-12-08 DIAGNOSIS — M00072 Staphylococcal arthritis, left ankle and foot: Secondary | ICD-10-CM

## 2017-12-08 DIAGNOSIS — B9561 Methicillin susceptible Staphylococcus aureus infection as the cause of diseases classified elsewhere: Secondary | ICD-10-CM

## 2017-12-08 LAB — BASIC METABOLIC PANEL
Anion gap: 16 — ABNORMAL HIGH (ref 5–15)
BUN: 56 mg/dL — ABNORMAL HIGH (ref 6–20)
CO2: 27 mmol/L (ref 22–32)
Calcium: 8 mg/dL — ABNORMAL LOW (ref 8.9–10.3)
Chloride: 93 mmol/L — ABNORMAL LOW (ref 98–111)
Creatinine, Ser: 10.99 mg/dL — ABNORMAL HIGH (ref 0.61–1.24)
GFR calc Af Amer: 6 mL/min — ABNORMAL LOW (ref >60)
GFR calc non Af Amer: 5 mL/min — ABNORMAL LOW (ref >60)
Glucose, Bld: 100 mg/dL — ABNORMAL HIGH (ref 70–99)
Potassium: 4.8 mmol/L (ref 3.5–5.1)
Sodium: 136 mmol/L (ref 135–145)

## 2017-12-08 LAB — CBC
HCT: 32.4 % — ABNORMAL LOW (ref 39.0–52.0)
Hemoglobin: 10 g/dL — ABNORMAL LOW (ref 13.0–17.0)
MCH: 29.3 pg (ref 26.0–34.0)
MCHC: 30.9 g/dL (ref 30.0–36.0)
MCV: 95 fL (ref 80.0–100.0)
Platelets: 91 10*3/uL — ABNORMAL LOW (ref 150–400)
RBC: 3.41 MIL/uL — ABNORMAL LOW (ref 4.22–5.81)
RDW: 16.4 % — ABNORMAL HIGH (ref 11.5–15.5)
WBC: 5.2 10*3/uL (ref 4.0–10.5)
nRBC: 0 % (ref 0.0–0.2)

## 2017-12-08 LAB — MRSA PCR SCREENING: MRSA by PCR: NEGATIVE

## 2017-12-08 LAB — BLOOD CULTURE ID PANEL (REFLEXED)

## 2017-12-08 MED ORDER — ALTEPLASE 2 MG IJ SOLR
INTRAMUSCULAR | Status: AC
  Administered 2017-12-08: 6 mg
  Filled 2017-12-08: qty 6

## 2017-12-08 MED ORDER — ALTEPLASE 2 MG IJ SOLR
6.0000 mg | Freq: Once | INTRAMUSCULAR | Status: AC | PRN
  Administered 2017-12-08: 6 mg

## 2017-12-08 MED ORDER — HEPARIN SODIUM (PORCINE) 1000 UNIT/ML IJ SOLN
INTRAMUSCULAR | Status: AC
  Administered 2017-12-08: 3700 [IU] via INTRAVENOUS_CENTRAL
  Filled 2017-12-08: qty 4

## 2017-12-08 MED ORDER — CEFAZOLIN SODIUM-DEXTROSE 1-4 GM/50ML-% IV SOLN
1.0000 g | INTRAVENOUS | Status: DC
  Administered 2017-12-09 – 2017-12-10 (×3): 1 g via INTRAVENOUS
  Filled 2017-12-08 (×4): qty 50

## 2017-12-08 MED ORDER — CEFAZOLIN SODIUM-DEXTROSE 1-4 GM/50ML-% IV SOLN
1.0000 g | INTRAVENOUS | Status: DC
  Administered 2017-12-08: 1 g via INTRAVENOUS
  Filled 2017-12-08: qty 50

## 2017-12-08 MED ORDER — FAMOTIDINE 20 MG PO TABS
20.0000 mg | ORAL_TABLET | Freq: Every day | ORAL | Status: DC
  Administered 2017-12-09 – 2017-12-17 (×9): 20 mg via ORAL
  Filled 2017-12-08 (×9): qty 1

## 2017-12-08 MED ORDER — SODIUM CHLORIDE 0.9 % IV SOLN: 100.0000 mL | INTRAVENOUS | Status: DC | PRN

## 2017-12-08 MED ORDER — LIDOCAINE-PRILOCAINE 2.5-2.5 % EX CREA: 1.0000 "application " | TOPICAL_CREAM | CUTANEOUS | Status: DC | PRN

## 2017-12-08 MED ORDER — PENTAFLUOROPROP-TETRAFLUOROETH EX AERO: 1.0000 "application " | INHALATION_SPRAY | CUTANEOUS | Status: DC | PRN

## 2017-12-08 MED ORDER — HEPARIN SODIUM (PORCINE) 1000 UNIT/ML IJ SOLN
INTRAMUSCULAR | Status: AC
  Administered 2017-12-08: 6 mL
  Filled 2017-12-08: qty 6

## 2017-12-08 MED ORDER — HEPARIN SODIUM (PORCINE) 1000 UNIT/ML DIALYSIS
3700.0000 [IU] | INTRAMUSCULAR | Status: DC | PRN
  Administered 2017-12-08: 3700 [IU] via INTRAVENOUS_CENTRAL
  Filled 2017-12-08: qty 3.7

## 2017-12-08 MED ORDER — HEPARIN SODIUM (PORCINE) 1000 UNIT/ML DIALYSIS: 1000.0000 [IU] | INTRAMUSCULAR | Status: DC | PRN

## 2017-12-08 MED ORDER — ALTEPLASE 2 MG IJ SOLR: 2.0000 mg | Freq: Once | INTRAMUSCULAR | Status: DC | PRN

## 2017-12-08 MED ORDER — LIDOCAINE HCL (PF) 1 % IJ SOLN: 5.0000 mL | INTRAMUSCULAR | Status: DC | PRN

## 2017-12-08 MED ORDER — ACETAMINOPHEN 325 MG PO TABS
ORAL_TABLET | ORAL | Status: AC
  Administered 2017-12-08: 17:00:00
  Filled 2017-12-08: qty 2

## 2017-12-08 MED ORDER — MORPHINE SULFATE (PF) 2 MG/ML IV SOLN
INTRAVENOUS | Status: AC
  Administered 2017-12-08: 2 mg via INTRAVENOUS
  Filled 2017-12-08: qty 1

## 2017-12-08 NOTE — Consult Note (Signed)
ORTHOPAEDIC CONSULTATION  REQUESTING PHYSICIAN: Dessa Phi, DO  Chief Complaint: Left toe pain  HPI: [name] is a 52 y.o. male with past medical history significant for ESRD on HD, PAD, and hypothyroidism, who complains of left great toe and right flank pain increasing over the past week.  He has had a wound on the toe for about 2 years which occasionally drains/bleeds, but has never drained purulent material or been infected.  His nephrostomy tube site has also become sore in the past week-this site occasionally drains/bleeds.  In hemodialysis fever was noted but was afebrile in the ED.  X-rays there showed chronic right great toe injury, degenerative changes in the midfoot, and soft tissue abnormality, but no obvious signs of osteomyelitis.  However, MRI of his left foot concerning for IP joint septic arthritis/osteomyelitis.  Orthopedics was consulted for evaluation.  ID was consulted due to MSSA bacteremia.  This afternoon his toe is sore, but pain is tolerable.  He denies history of diabetes or smoking.  He denies neuropathy or decreased sensation.     Past Medical History:  Diagnosis Date  . Anemia   . Anxiety   . Constipation   . End stage renal failure on dialysis [facility])    "[place]; MWF" (02/29/2016)  . GERD (gastroesophageal reflux disease)   . Grand mal seizure ([place]) X 4   last 1998 (02/29/2016)  . Heart murmur    no problems with per pt. - nephrologist and Dr. Coletta Memos  . History of blood transfusion 2000s   "I was having blood loss; never found out from where"  . History of kidney stones   . Hypertension    hx of - has not taken bp meds in over 2 years  . Hypothyroidism   . Insomnia   . Migraine    "due to my BP in my late 1990s; none since" (02/29/2016)  . Nonhealing surgical wound    of the left arteriovenous graft  . Pneumonia    x 2  . Shortness of breath    rarely  . Spina bifida [name])    Past Surgical History:  Procedure Laterality Date    . APPENDECTOMY    . ARTERIOVENOUS GRAFT PLACEMENT Left 10/16/2012   left femoral goretex graft         Dr Donnetta Hutching  . AV FISTULA PLACEMENT Left 06/27/2012   Procedure: EXPLORATORY LEFT THY-GRAFT PSEUDO-ANEURYSM;  Surgeon: Conrad Sterling, MD;  Location: [place];  Service: Vascular;  Laterality: Left;  Revision of left Arteriovenus gortex graft in thigh.  . COLONOSCOPY    . I&D EXTREMITY Left 02/29/2016   Procedure: DEBRIDEMENT LEFT THIGH WOUND;  Surgeon: Waynetta Sandy, MD;  Location: [place];  Service: Vascular;  Laterality: Left;  . ILEOSTOMY  1970s  . INCISION AND DRAINAGE Left 10/16/2012   Procedure: INCISION AND Debridement left thigh graft;  Surgeon: Rosetta Posner, MD;  Location: [place];  Service: Vascular;  Laterality: Left;  . INSERTION OF DIALYSIS CATHETER    . INSERTION OF DIALYSIS CATHETER  01/14/2016   Procedure: INSERTION OF DIALYSIS CATHETER;  Surgeon: Waynetta Sandy, MD;  Location: [place];  Service: Vascular;;  . INSERTION OF DIALYSIS CATHETER Right 08/07/2017   Procedure: INSERTION OF DIALYSIS CATHETER;  Surgeon: Waynetta Sandy, MD;  Location: [place];  Service: Vascular;  Laterality: Right;  . INSERTION OF ILIAC STENT Left 01/14/2016   Procedure: INSERTION OF ILIAC STENT;  Surgeon: Waynetta Sandy, MD;  [facility];  Service: Vascular;  Laterality: Left;  . INTRAOPERATIVE ARTERIOGRAM Left 01/14/2016   Procedure: INTRA OPERATIVE ARTERIOGRAM;  Surgeon: Waynetta Sandy, MD;  Location: [place];  Service: Vascular;  Laterality: Left;  . LITHOTRIPSY  x 3   "& a laser treatment" (02/29/2016)  . NEPHROSTOMY Bilateral 1968  . PATELLAR TENDON REPAIR Right 1990s   "big incision"  . PERIPHERAL VASCULAR CATHETERIZATION  01/14/2016   Procedure: A/V SHUNTOGRAM;  Surgeon: Waynetta Sandy, MD;  Location: [place];  Service: Vascular;;  . REVISION OF ARTERIOVENOUS GORETEX GRAFT Left 10/16/2012   Procedure: REVISION OF LEFT FEMORAL LOOP ARTERIOVENOUS  GORETEX GRAFT;  Surgeon: Rosetta Posner, MD;  Location: [place];  Service: Vascular;  Laterality: Left;  . REVISION OF ARTERIOVENOUS GORETEX GRAFT Left 02/11/2015   Procedure: EXCISION OF SMALL SEGMENT OF EXPOSED LEFT THIGH NON FUNCTIONING  ARTERIOVENOUS GORETEX GRAFT;  Surgeon: Mal Misty, MD;  Location: [place];  Service: Vascular;  Laterality: Left;  . REVISION OF ARTERIOVENOUS GORETEX GRAFT Left 01/14/2016   Procedure: REVISION OF ARTERIOVENOUS GORETEX GRAFT;  Surgeon: Waynetta Sandy, MD;  Location: [place];  Service: Vascular;  Laterality: Left;  . REVISION OF ARTERIOVENOUS GORETEX GRAFT Left 02/29/2016   thigh/pt report  . REVISION OF ARTERIOVENOUS GORETEX GRAFT Left 02/29/2016   Procedure: POSSIBLE REVISION OF LEFT THIGH ARTERIOVENOUS GORETEX GRAFT;  Surgeon: Waynetta Sandy, MD;  Location: [place];  Service: Vascular;  Laterality: Left;  . THROMBECTOMY W/ EMBOLECTOMY Left 01/14/2016   Procedure: THROMBECTOMY ARTERIOVENOUS GORE-TEX Left thigh GRAFT;  Surgeon: Waynetta Sandy, MD;  Location: [place];  Service: Vascular;  Laterality: Left;  . TONGUE SURGERY  ~ 1990   tongue-tie release   . ULTRASOUND GUIDANCE FOR VASCULAR ACCESS Right 08/07/2017   Procedure: ULTRASOUND GUIDANCE FOR VASCULAR CANNULATION RIGHT FEMORAL VEIN AND LEFT AV FEMORAL GRAFT;  Surgeon: Waynetta Sandy, MD;  Location: [place];  Service: Vascular;  Laterality: Right;  . UPPER EXTREMITY VENOGRAPHY Bilateral 08/09/2017   Procedure: UPPER EXTREMITY VENOGRAPHY;  Surgeon: Serafina Mitchell, MD;  Location: [place] [facility];  Service: Cardiovascular;  Laterality: Bilateral;  . VENOGRAM N/A 09/20/2017   Procedure: RIGHT COMMON FEMORAL ARTERY EXPLORATION.  CANNULATION RIGHT COMMON FEMORAL VEIN. VENOGRAM CENTRAL ULTRA SOUND GUIDED RIGHT FEMORAL VEIN TIMES TWO.;  Surgeon: Waynetta Sandy, MD;  Location: [place];  Service: Vascular;  Laterality: N/A;  . WOUND DEBRIDEMENT Left 02/29/2016   thigh   Social  History   Socioeconomic History  . Marital status: Single    Spouse name: Not on file  . Number of children: 0  . Years of education: Not on file  . Highest education level: Not on file  Occupational History  . Occupation: Disabled  Social Needs  . Financial resource strain: Not on file  . Food insecurity:    Worry: Not on file    Inability: Not on file  . Transportation needs:    Medical: Not on file    Non-medical: Not on file  Tobacco Use  . Smoking status: Never Smoker  . Smokeless tobacco: Never Used  Substance and Sexual Activity  . Alcohol use: Not Currently    Alcohol/week: 0.0 standard drinks    Comment: 02/29/2016 "nothing since  the mid 1990s  . Drug use: Not Currently    Types: Marijuana    Comment: 02/29/2016 "nothing since ~ 1995"  . Sexual activity: Never  Lifestyle  . Physical activity:    Days per  week: Not on file    Minutes per session: Not on file  . Stress: Not on file  Relationships  . Social connections:    Talks on phone: Not on file    Gets together: Not on file    Attends religious service: Not on file    Active member of club or organization: Not on file    Attends meetings of clubs or organizations: Not on file    Relationship status: Not on file  Other Topics Concern  . Not on file  Social History Narrative  . Not on file   Family History  Problem Relation Age of Onset  . Diabetes Mother   . Hypertension Mother   . Heart disease Mother        before age 78  . Diabetes Father   . Heart attack Father        X's 3  . Diabetes Sister   . Bipolar disorder Sister    Allergies  Allergen Reactions  . Hydrocodone Other (See Comments)    LIKELY NOT REACTION TO HYDROCODONE Pt states that after 3 weeks of taking this medication he will began to "twitch" Pt states that after 3 weeks of taking this medication he will began to "twitch" LIKELY NOT REACTION TO HYDROCODONE Pt states that after 3 weeks of taking this medication he will began  to "twitch" LIKELY NOT REACTION TO HYDROCODONE Pt states that after 3 weeks of taking this medication he will began to "twitch" Pt states that after 3 weeks of taking this medication he will began to "twitch"  . Furadantin [Nitrofurantoin] Other (See Comments)    UNSPECIFIED REACTION   . Mandelamine [Methenamine] Other (See Comments)    UNSPECIFIED REACTION   . Noroxin [Norfloxacin] Other (See Comments)    UNSPECIFIED REACTION   . Carmine Nausea Only  . Contrast Media [Iodinated Diagnostic Agents] Nausea And Vomiting    Oral dye causes vomiting, IV dye is okay  . Metrizamide Nausea And Vomiting    Oral dye causes vomiting, IV dye is okay  . Sulfa Antibiotics Cough    Childhood reaction - pt could not confirm that it was a cough  . Sulfur Cough    Childhood reaction - pt could not confirm that it was a cough   Prior to Admission medications   Medication Sig Start Date End Date Taking? Authorizing Provider  aspirin 325 MG tablet Take 325 mg by mouth daily at 2 PM.    Yes [provider]  calcium acetate (PHOSLO) 667 MG capsule Take 2,001-2,708 mg by mouth See admin instructions. Take 2708 mg by mouth 3 times daily with meals, take 2001 mg by mouth with snacks   Yes [provider]  clopidogrel (PLAVIX) 75 MG tablet Take 1 tablet (75 mg total) by mouth daily. 01/17/16  Yes Alvia Grove, PA-C  famotidine (PEPCID) 40 MG tablet Take 40 mg by mouth daily at 2 PM.    Yes [provider]  fluticasone (FLONASE) 50 MCG/ACT nasal spray Place 2 sprays into both nostrils as needed for allergies or rhinitis (congestion).    Yes [provider]  furosemide (LASIX) 80 MG tablet Take 80 mg by mouth daily as needed (for swelling or shortness of breath).    Yes [provider]  levothyroxine (SYNTHROID, LEVOTHROID) 125 MCG tablet Take 125 mcg by mouth daily before breakfast.    Yes [provider]  LORazepam (ATIVAN) 0.5 MG tablet Take 0.5 mg by  mouth daily as needed for anxiety.    Yes [provider]  oxyCODONE-acetaminophen (PERCOCET) 10-325 MG tablet Take 1 tablet by mouth 4 (four) times daily as needed for pain. Patient taking differently: Take 1 tablet by mouth every 6 (six) hours.  03/01/16  Yes Alvia Grove, PA-C  promethazine (PHENERGAN) 25 MG tablet Take 25 mg by mouth 2 (two) times daily.    Yes [provider]  sodium polystyrene (KAYEXALATE) powder Take 15 g by mouth See admin instructions. Take 15 g (mixed in water) by mouth on Saturdays and Sundays   Yes [provider]  traZODone (DESYREL) 100 MG tablet Take 100 mg by mouth at bedtime.   Yes [provider]  zolpidem (AMBIEN) 10 MG tablet Take 10 mg by mouth at bedtime.    Yes [provider]  midodrine (PROAMATINE) 5 MG tablet Take 1 tablet (5 mg total) by mouth 3 (three) times daily with meals. Patient not taking: Reported on 12/07/2017 01/18/16   Ansel Bong   Dg Chest 2 View  Result Date: 12/07/2017 CLINICAL DATA:  52 year old male with a history of fever EXAM: CHEST - 2 VIEW COMPARISON:  08/07/2017 FINDINGS: Cardiomediastinal silhouette unchanged in size and contour. Low lung volumes with coarsened interstitial markings. No pleural effusion. No confluent airspace disease. No pneumothorax. IMPRESSION: Low lung volumes, with either chronic changes or early edema. Electronically Signed   By: Corrie Mckusick D.O.   On: 12/07/2017 11:44   Mr Foot Left Wo Contrast  Result Date: 12/07/2017 CLINICAL DATA:  Soft tissue wound of the great toe. Evaluate for osteomyelitis. EXAM: MRI OF THE LEFT FOOT WITHOUT CONTRAST TECHNIQUE: Multiplanar, multisequence MR imaging of the left forefoot was performed. No intravenous contrast was administered. COMPARISON:  None. FINDINGS: Bones/Joint/Cartilage Soft tissue wound involving the plantar aspect of the great toe extending to the IP joint. Small first IP joint joint effusion with marrow  edema on either side of the joint concerning for septic arthritis. No fracture or dislocation. Normal alignment. No other joint effusion. Advanced degenerative changes of midfoot which are partially visualized. Ligaments Collateral ligaments are intact.  Lisfranc ligament is intact. Muscles and Tendons Flexor, peroneal and extensor compartment tendons are intact. Diffuse muscle atrophy throughout the left foot. Soft tissue No fluid collection or hematoma.  No soft tissue mass. IMPRESSION: 1. Soft tissue wound involving the plantar aspect of the great toe extending to the IP joint. Small first IP joint joint effusion with marrow edema on either side of the joint concerning for septic arthritis and osteomyelitis. Electronically Signed   By: Kathreen Devoid   On: 12/07/2017 19:02   Dg Foot Complete Left  Result Date: 12/07/2017 CLINICAL DATA:  Toe infection. EXAM: LEFT FOOT - COMPLETE 3+ VIEW COMPARISON:  None. FINDINGS: Soft tissue deformity noted of the distal great toe. I do not see plain radiographic evidence of osteomyelitis. There is some chronic post traumatic deformity of the inter phalangeal joint of the great toe. Degenerative changes are noted in the midfoot, possibly neuropathic or post traumatic. IMPRESSION: Soft tissue abnormality of the great toe without plain radiographic evidence of underlying osteomyelitis. Extensive degenerative changes of the midfoot, possibly neuropathic or post traumatic. Electronically Signed   By: Nelson Chimes M.D.   On: 12/07/2017 12:50    Positive ROS: All other systems have been reviewed and were otherwise negative with the exception of those mentioned in the HPI and as above.  Objective: Labs cbc Recent Labs  12/07/17 1115 12/08/17 0636  WBC 8.1 5.2  HGB 10.9* 10.0*  HCT 36.0* 32.4*  PLT 92* 91*    Labs inflam No results for input(s): CRP in the last 72 hours.  Invalid input(s): ESR  Labs coag Recent Labs    12/07/17 1115  INR 1.15    Recent  Labs    12/07/17 1115 12/08/17 0636  NA 134* 136  K 4.3 4.8  CL 92* 93*  CO2 24 27  GLUCOSE 101* 100*  BUN 39* 56*  CREATININE 9.00* 10.99*  CALCIUM 8.0* 8.0*    Physical Exam: Vitals:   12/08/17 1500 12/08/17 1530  BP: 132/64 (!) 117/59  Pulse: 75 74  Resp:    Temp:    SpO2:     General: Alert, no acute distress.  Upright in bed in hemodialysis eating.  Calm, conversant Mental status: Alert and Oriented x3 Neurologic: Speech Clear and organized, no gross focal findings or movement disorder appreciated. Respiratory: No cyanosis, no use of accessory musculature Cardiovascular: Rate regular GI: Abdomen is soft and non-tender, non-distended. Skin: Warm and dry.  Extremities: Warm.  Moves all extremities independently. Psychiatric: Patient is competent for consent with normal mood and affect  MUSCULOSKELETAL:  LLE: Left foot and toes are warm.  Sensation is grossly intact throughout.  He flexes and extends all toes with relative ease and minor discomfort.  Mild decreased fine sensation plantar aspect of his great toe.  There is a large what appears to be superficial hematoma blister primarily on the medial aspect of the great toe extending approximately to the IP joint - This is soft.  The remainder of the great toe is somewhat erythematous, but the foot is not.  There is no drainage.  There is a firm callused chronic corn/wound medial great toe. Other extremities are atraumatic with painless ROM and NVI.  Assessment / Plan: Principal Problem:   MSSA bacteremia Active Problems:   End stage renal disease (HCC)   Fever   Right flank pain   Left great toe chronic wound, IP septic arthritis / osteomyelitis -This is in the setting of MSSA bacteremia in an ESRD patient who also has a chronic right flank nephrostomy tube wound.   The patient is stable and does not look toxic.  He is upright eating in hemodialysis during interview.  MRI concerning for septic arthritis and  osteomyelitis.     Recommend continuing antibiotic therapy per ID recommendations  Dr. Percell Miller has discussed this case with Dr. Meridee Score who will see the patient in the morning.   Surgery/amputation likely, but not emergent.    Assessment/plan was discussed with the patient and he verbalized understanding and agrees.   Contact information:  Edmonia Lynch MD, Roxan Hockey PA-C  Prudencio Burly III PA-C 12/08/2017 4:11 PM

## 2017-12-08 NOTE — Progress Notes (Signed)
PROGRESS NOTE    [name]  XKG:[id number] DOB: 11/20/1965 DOA: 12/07/2017 PCP: [name], MD     Brief Narrative:  [name] is a 52 year old male with past medical history significant for end-stage renal disease on hemodialysis, peripheral artery disease, hypothyroidism, hypotension on midodrine, who presents with fevers and chills.  He admits to 1 week history of not feeling well, fever of 102 at home.  He has also been having left great toe pain and redness that has been getting worse in the past 2 weeks.  He was in dialysis when he was found to be febrile at one 1.5, he was started on vancomycin, Fortaz.  Also complaining of right flank pain at the site of prior nephrostomy site, reports history of bleeding from the site as well.  In the emergency department, he was found to be afebrile, WBC 8.1.  He was admitted for treatment of what was thought to be left toe cellulitis.  New events last 24 hours / Subjective: No acute events overnight, continues to have subjective chills pain of right flank.  No further drainage from the left great toe.  Assessment & Plan:   Principal Problem:   MSSA bacteremia Active Problems:   End stage renal disease (HCC)   Fever   Right flank pain   Sepsis secondary to left toe osteomyelitis, MSSA bacteremia  -MRI revealed: Soft tissue wound involving the plantar aspect of the great toe extending to the IP joint. Small first IP joint joint effusion with marrow edema on either side of the joint concerning for septic arthritis and osteomyelitis. -Consulted orthopedic surgery -Consulted infectious disease -Vanco/ceftazidime --> Cefazolin   Right flank pain tenderness, status post right nephrostomy tube in the past -Check CT abdomen pelvis -UA pending   ESRD on hemodialysis Per nephrology  Hypothyroidism Continue Synthroid  Chronic hypotension Continue Midodrine   PAD Continue plavix    DVT prophylaxis: Subq hep Code Status:  Full Family Communication: No family at bedside Disposition Plan: Pending orthopedic surgery evaluation    Consultants:   Nephrology  ID  Orthopedic surgery   Procedures:   None   Antimicrobials:  Anti-infectives (From admission, onward)   Start     Dose/Rate Route Frequency Ordered Stop   12/10/17 2000  cefTAZidime (FORTAZ) 2 g in sodium chloride 0.9 % 100 mL IVPB  Status:  Discontinued     2 g 200 mL/hr over 30 Minutes Intravenous Every M-W-F (2000) 12/07/17 2218 12/08/17 1029   12/10/17 1200  vancomycin (VANCOCIN) IVPB 1000 mg/200 mL premix  Status:  Discontinued     1,000 mg 200 mL/hr over 60 Minutes Intravenous Every M-W-F (Hemodialysis) 12/07/17 2218 12/08/17 1029   12/08/17 2000  cefTAZidime (FORTAZ) 2 g in sodium chloride 0.9 % 100 mL IVPB  Status:  Discontinued     2 g 200 mL/hr over 30 Minutes Intravenous  Once 12/07/17 2218 12/08/17 1157   12/08/17 1200  vancomycin (VANCOCIN) IVPB 1000 mg/200 mL premix  Status:  Discontinued     1,000 mg 200 mL/hr over 60 Minutes Intravenous Every T-Th-Sa (Hemodialysis) 12/07/17 2218 12/08/17 1157   12/08/17 1100  ceFAZolin (ANCEF) IVPB 1 g/50 mL premix     1 g 100 mL/hr over 30 Minutes Intravenous Every 24 hours 12/08/17 1029     12/08/17 0000  vancomycin (VANCOCIN) IVPB 1000 mg/200 mL premix  Status:  Discontinued     1,000 mg 200 mL/hr over 60 Minutes Intravenous Every M-W-F (Hemodialysis) 12/07/17 2216 12/07/17 2218  12/08/17 0000  cefTAZidime (FORTAZ) 2 g in sodium chloride 0.9 % 100 mL IVPB  Status:  Discontinued     2 g 200 mL/hr over 30 Minutes Intravenous Every M-W-F (2000) 12/07/17 2216 12/07/17 2218   12/07/17 2215  cefTAZidime (FORTAZ) 2 g in sodium chloride 0.9 % 100 mL IVPB  Status:  Discontinued     2 g 200 mL/hr over 30 Minutes Intravenous Every M-W-F (2000) 12/07/17 2209 12/07/17 2216       Objective: Vitals:   12/07/17 2237 12/07/17 2329 12/08/17 0518 12/08/17 0837  BP:  105/63 105/63 111/68  Pulse:   87 69 80  Resp:   20 20  Temp: 98.8 F (37.1 C) (!) 102.9 F (39.4 C) 98.2 F (36.8 C) 100 F (37.8 C)  TempSrc: Oral Oral Oral Oral  SpO2:  99% 99% 100%  Weight:      Height:        Intake/Output Summary (Last 24 hours) at 12/08/2017 1159 Last data filed at 12/08/2017 0933 Gross per 24 hour  Intake 236 ml  Output -  Net 236 ml   Filed Weights   12/07/17 1109  Weight: 124 kg    Examination:  General exam: Appears calm and comfortable  Respiratory system: Clear to auscultation. Respiratory effort normal. Cardiovascular system: S1 & S2 heard, RRR. No JVD, murmurs, rubs, gallops or clicks. No pedal edema. Gastrointestinal system: Abdomen is nondistended, soft and nontender. No organomegaly or masses felt. Normal bowel sounds heard. Central nervous system: Alert and oriented. No focal neurological deficits. Extremities: Symmetric 5 x 5 power. GU: Urostomy bag present   Skin: Right flank without any drainage Psychiatry: Judgement and insight appear normal. Mood & affect appropriate.         Data Reviewed: I have personally reviewed following labs and imaging studies  CBC: Recent Labs  Lab 12/07/17 1115 12/08/17 0636  WBC 8.1 5.2  NEUTROABS 7.4  --   HGB 10.9* 10.0*  HCT 36.0* 32.4*  MCV 96.3 95.0  PLT 92* 91*   Basic Metabolic Panel: Recent Labs  Lab 12/07/17 1115 12/08/17 0636  NA 134* 136  K 4.3 4.8  CL 92* 93*  CO2 24 27  GLUCOSE 101* 100*  BUN 39* 56*  CREATININE 9.00* 10.99*  CALCIUM 8.0* 8.0*   GFR: Estimated Creatinine Clearance: 10.5 mL/min (A) (by C-G formula based on SCr of 10.99 mg/dL (H)). Liver Function Tests: Recent Labs  Lab 12/07/17 1115  AST 19  ALT 14  ALKPHOS 56  BILITOT 1.1  PROT 8.3*  ALBUMIN 3.0*   No results for input(s): LIPASE, AMYLASE in the last 168 hours. No results for input(s): AMMONIA in the last 168 hours. Coagulation Profile: Recent Labs  Lab 12/07/17 1115  INR 1.15   Cardiac Enzymes: Recent Labs    Lab 12/07/17 1243  TROPONINI 0.04*   BNP (last 3 results) No results for input(s): PROBNP in the last 8760 hours. HbA1C: No results for input(s): HGBA1C in the last 72 hours. CBG: No results for input(s): GLUCAP in the last 168 hours. Lipid Profile: No results for input(s): CHOL, HDL, LDLCALC, TRIG, CHOLHDL, LDLDIRECT in the last 72 hours. Thyroid Function Tests: No results for input(s): TSH, T4TOTAL, FREET4, T3FREE, THYROIDAB in the last 72 hours. Anemia Panel: No results for input(s): VITAMINB12, FOLATE, FERRITIN, TIBC, IRON, RETICCTPCT in the last 72 hours. Sepsis Labs: Recent Labs  Lab 12/07/17 1130  LATICACIDVEN 1.33    Recent Results (from the past 240 hour(s))  Culture, blood (Routine x 2)     Status: None (Preliminary result)   Collection Time: 12/07/17 11:15 AM  Result Value Ref Range Status   Specimen Description BLOOD RIGHT ANTECUBITAL  Final   Special Requests   Final    BOTTLES DRAWN AEROBIC AND ANAEROBIC Blood Culture adequate volume   Culture  Setup Time   Final    GRAM POSITIVE COCCI IN CLUSTERS ANAEROBIC BOTTLE ONLY CRITICAL RESULT CALLED TO, READ BACK BY AND VERIFIED WITH: [place], [place] [id number] FCP Performed at [facility], [place] [address], [place], [place] [address]    Culture GRAM POSITIVE COCCI  Final   Report Status PENDING  Incomplete  Blood Culture ID Panel (Reflexed)     Status: Abnormal   Collection Time: 12/07/17 11:15 AM  Result Value Ref Range Status   Enterococcus species NOT DETECTED NOT DETECTED Final   Listeria monocytogenes NOT DETECTED NOT DETECTED Final   Staphylococcus species DETECTED (A) NOT DETECTED Final    Comment: CRITICAL RESULT CALLED TO, READ BACK BY AND VERIFIED WITH: PHARMD ESPADA, M [phone] [id number] FCP    Staphylococcus aureus (BCID) DETECTED (A) NOT DETECTED Final    Comment: Methicillin (oxacillin) susceptible Staphylococcus aureus (MSSA). Preferred therapy is anti staphylococcal beta lactam antibiotic  (Cefazolin or Nafcillin), unless clinically contraindicated. CRITICAL RESULT CALLED TO, READ BACK BY AND VERIFIED WITH: PHARMD ESPADA, M [id number] FCP    Methicillin resistance NOT DETECTED NOT DETECTED Final   Streptococcus species NOT DETECTED NOT DETECTED Final   Streptococcus agalactiae NOT DETECTED NOT DETECTED Final   Streptococcus pneumoniae NOT DETECTED NOT DETECTED Final   Streptococcus pyogenes NOT DETECTED NOT DETECTED Final   Acinetobacter baumannii NOT DETECTED NOT DETECTED Final   Enterobacteriaceae species NOT DETECTED NOT DETECTED Final   Enterobacter cloacae complex NOT DETECTED NOT DETECTED Final   Escherichia coli NOT DETECTED NOT DETECTED Final   Klebsiella oxytoca NOT DETECTED NOT DETECTED Final   Klebsiella pneumoniae NOT DETECTED NOT DETECTED Final   Proteus species NOT DETECTED NOT DETECTED Final   Serratia marcescens NOT DETECTED NOT DETECTED Final   Haemophilus influenzae NOT DETECTED NOT DETECTED Final   Neisseria meningitidis NOT DETECTED NOT DETECTED Final   Pseudomonas aeruginosa NOT DETECTED NOT DETECTED Final   Candida albicans NOT DETECTED NOT DETECTED Final   Candida glabrata NOT DETECTED NOT DETECTED Final   Candida krusei NOT DETECTED NOT DETECTED Final   Candida parapsilosis NOT DETECTED NOT DETECTED Final   Candida tropicalis NOT DETECTED NOT DETECTED Final    Comment: Performed at [facility], [address] [place], [place] [address]  Culture, blood (Routine x 2)     Status: None (Preliminary result)   Collection Time: 12/07/17 11:17 AM  Result Value Ref Range Status   Specimen Description BLOOD BLOOD LEFT ARM  Final   Special Requests   Final    BOTTLES DRAWN AEROBIC AND ANAEROBIC Blood Culture adequate volume   Culture  Setup Time   Final    GRAM POSITIVE COCCI IN CLUSTERS ANAEROBIC BOTTLE ONLY CRITICAL VALUE NOTED.  VALUE IS CONSISTENT WITH PREVIOUSLY REPORTED AND CALLED VALUE.    Culture   Final    NO GROWTH < 12  HOURS Performed at [facility], [place] [address] [place], [place] [address]    Report Status PENDING  Incomplete  MRSA PCR Screening     Status: None   Collection Time: 12/08/17 12:05 AM  Result Value Ref Range Status  MRSA by PCR NEGATIVE NEGATIVE Final    Comment:        The GeneXpert MRSA Assay (FDA approved for NASAL specimens only), is one component of a comprehensive MRSA colonization surveillance program. It is not intended to diagnose MRSA infection nor to guide or monitor treatment for MRSA infections. Performed at [facility], [place] [address], [place], [place] [address]        Radiology Studies: Dg Chest 2 View  Result Date: 12/07/2017 CLINICAL DATA:  52 year old male with a history of fever EXAM: CHEST - 2 VIEW COMPARISON:  08/07/2017 FINDINGS: Cardiomediastinal silhouette unchanged in size and contour. Low lung volumes with coarsened interstitial markings. No pleural effusion. No confluent airspace disease. No pneumothorax. IMPRESSION: Low lung volumes, with either chronic changes or early edema. Electronically Signed   By: Corrie Mckusick D.O.   On: 12/07/2017 11:44   Mr Foot Left Wo Contrast  Result Date: 12/07/2017 CLINICAL DATA:  Soft tissue wound of the great toe. Evaluate for osteomyelitis. EXAM: MRI OF THE LEFT FOOT WITHOUT CONTRAST TECHNIQUE: Multiplanar, multisequence MR imaging of the left forefoot was performed. No intravenous contrast was administered. COMPARISON:  None. FINDINGS: Bones/Joint/Cartilage Soft tissue wound involving the plantar aspect of the great toe extending to the IP joint. Small first IP joint joint effusion with marrow edema on either side of the joint concerning for septic arthritis. No fracture or dislocation. Normal alignment. No other joint effusion. Advanced degenerative changes of midfoot which are partially visualized. Ligaments Collateral ligaments are intact.  Lisfranc ligament is intact. Muscles and Tendons Flexor,  peroneal and extensor compartment tendons are intact. Diffuse muscle atrophy throughout the left foot. Soft tissue No fluid collection or hematoma.  No soft tissue mass. IMPRESSION: 1. Soft tissue wound involving the plantar aspect of the great toe extending to the IP joint. Small first IP joint joint effusion with marrow edema on either side of the joint concerning for septic arthritis and osteomyelitis. Electronically Signed   By: Kathreen Devoid   On: 12/07/2017 19:02   Dg Foot Complete Left  Result Date: 12/07/2017 CLINICAL DATA:  Toe infection. EXAM: LEFT FOOT - COMPLETE 3+ VIEW COMPARISON:  None. FINDINGS: Soft tissue deformity noted of the distal great toe. I do not see plain radiographic evidence of osteomyelitis. There is some chronic post traumatic deformity of the inter phalangeal joint of the great toe. Degenerative changes are noted in the midfoot, possibly neuropathic or post traumatic. IMPRESSION: Soft tissue abnormality of the great toe without plain radiographic evidence of underlying osteomyelitis. Extensive degenerative changes of the midfoot, possibly neuropathic or post traumatic. Electronically Signed   By: Nelson Chimes M.D.   On: 12/07/2017 12:50      Scheduled Meds: . calcium acetate  2,668 mg Oral TID WC  . Chlorhexidine Gluconate Cloth  6 each Topical Q0600  . clopidogrel  75 mg Oral Daily  . [START ON 12/10/2017] doxercalciferol  6 mcg Intravenous Q M,W,F-HD  . famotidine  40 mg Oral Q1400  . heparin  5,000 Units Subcutaneous Q12H  . levothyroxine  125 mcg Oral QAC breakfast  . midodrine  5 mg Oral TID WC  . promethazine  25 mg Oral BID  . sodium chloride flush  3 mL Intravenous Q12H  . [START ON 12/09/2017] sodium polystyrene  15 g Oral Once per day on Sun Sat  . traZODone  100 mg Oral QHS  . zolpidem  10 mg Oral QHS   Continuous Infusions: . sodium  chloride 250 mL (12/08/17 1139)  .  ceFAZolin (ANCEF) IV 1 g (12/08/17 1141)     LOS: 1 day    Time spent: 50  minutes   Dessa Phi, DO [facility] [email or web address] Password [email or web address] 12/08/2017, 11:59 AM

## 2017-12-08 NOTE — Consult Note (Signed)
[name] for Infectious Disease       Reason for Consult: MSSA bacteremia    Referring Physician: CHAMP autoconsult  Principal Problem:   MSSA bacteremia Active Problems:   End stage renal disease ([place])   Fever   Right flank pain   . calcium acetate  2,668 mg Oral TID WC  . Chlorhexidine Gluconate Cloth  6 each Topical Q0600  . clopidogrel  75 mg Oral Daily  . [START ON 12/10/2017] doxercalciferol  6 mcg Intravenous Q M,W,F-HD  . famotidine  20 mg Oral QHS  . heparin  5,000 Units Subcutaneous Q12H  . levothyroxine  125 mcg Oral QAC breakfast  . midodrine  5 mg Oral TID WC  . promethazine  25 mg Oral BID  . sodium chloride flush  3 mL Intravenous Q12H  . [START ON 12/09/2017] sodium polystyrene  15 g Oral Once per day on Sun Sat  . traZODone  100 mg Oral QHS  . zolpidem  10 mg Oral QHS    Recommendations: Continue cefazolin Ortho (Dr. Sharol Given to see in the am) Line holiday when possible TTE - ordered  Assessment: He has MSSA bacteremia in the setting of left great toe septic arthrtitis/osteomyelitis.  Also with limited dialysis access sites at this point.    Antibiotics: Cefazolin with dialysis  HPI: [name] is a 52 y.o. male with ESRD on IHD with fever to 102 and now bacteremia as above.  Currently on dialysis and catheter in his right leg. Has had recent great toe pain and a growth.  He tells me he thinks his infection is from his flank at the site of his nephrostomy tubes.  MRI of toe with septic arthritis and osteomyelitis.  He voices his concern with limited dialysis access sites.     Review of Systems:  Constitutional: negative for fevers and chills Respiratory: negative for cough Gastrointestinal: negative for nausea and diarrhea All other systems reviewed and are negative    Past Medical History:  Diagnosis Date  . Anemia   . Anxiety   . Constipation   . End stage renal failure on dialysis [facility])    "[place]; MWF" (02/29/2016)  . GERD  (gastroesophageal reflux disease)   . Grand mal seizure ([place]) X 4   last 1998 (02/29/2016)  . Heart murmur    no problems with per pt. - nephrologist and Dr. Coletta Memos  . History of blood transfusion 2000s   "I was having blood loss; never found out from where"  . History of kidney stones   . Hypertension    hx of - has not taken bp meds in over 2 years  . Hypothyroidism   . Insomnia   . Migraine    "due to my BP in my late 1990s; none since" (02/29/2016)  . Nonhealing surgical wound    of the left arteriovenous graft  . Pneumonia    x 2  . Shortness of breath    rarely  . Spina bifida [facility])     Social History   Tobacco Use  . Smoking status: Never Smoker  . Smokeless tobacco: Never Used  Substance Use Topics  . Alcohol use: Not Currently    Alcohol/week: 0.0 standard drinks    Comment: 02/29/2016 "nothing since  the mid 1990s  . Drug use: Not Currently    Types: Marijuana    Comment: 02/29/2016 "nothing since ~ 1995"    Family History  Problem Relation Age of Onset  .  Diabetes Mother   . Hypertension Mother   . Heart disease Mother        before age 58  . Diabetes Father   . Heart attack Father        X's 3  . Diabetes Sister   . Bipolar disorder Sister     Allergies  Allergen Reactions  . Hydrocodone Other (See Comments)    LIKELY NOT REACTION TO HYDROCODONE Pt states that after 3 weeks of taking this medication he will began to "twitch" Pt states that after 3 weeks of taking this medication he will began to "twitch" LIKELY NOT REACTION TO HYDROCODONE Pt states that after 3 weeks of taking this medication he will began to "twitch" LIKELY NOT REACTION TO HYDROCODONE Pt states that after 3 weeks of taking this medication he will began to "twitch" Pt states that after 3 weeks of taking this medication he will began to "twitch"  . Furadantin [Nitrofurantoin] Other (See Comments)    UNSPECIFIED REACTION   . Mandelamine [Methenamine] Other (See Comments)     UNSPECIFIED REACTION   . Noroxin [Norfloxacin] Other (See Comments)    UNSPECIFIED REACTION   . Carmine Nausea Only  . Contrast Media [Iodinated Diagnostic Agents] Nausea And Vomiting    Oral dye causes vomiting, IV dye is okay  . Metrizamide Nausea And Vomiting    Oral dye causes vomiting, IV dye is okay  . Sulfa Antibiotics Cough    Childhood reaction - pt could not confirm that it was a cough  . Sulfur Cough    Childhood reaction - pt could not confirm that it was a cough    Physical Exam: Constitutional: in no apparent distress and alert  Vitals:   12/08/17 1530 12/08/17 1600  BP: (!) 117/59 (!) 96/47  Pulse: 74 81  Resp:    Temp:    SpO2:     EYES: anicteric ENMT: no thrush Cardiovascular: Cor RRR Respiratory: CTA B; normal respiratory effort GI: soft Musculoskeletal: left great toe with some erythema, blister Skin: no rash Neuro: non focal  Lab Results  Component Value Date   WBC 5.2 12/08/2017   HGB 10.0 (L) 12/08/2017   HCT 32.4 (L) 12/08/2017   MCV 95.0 12/08/2017   PLT 91 (L) 12/08/2017    Lab Results  Component Value Date   CREATININE 10.99 (H) 12/08/2017   BUN 56 (H) 12/08/2017   NA 136 12/08/2017   K 4.8 12/08/2017   CL 93 (L) 12/08/2017   CO2 27 12/08/2017    Lab Results  Component Value Date   ALT 14 12/07/2017   AST 19 12/07/2017   ALKPHOS 56 12/07/2017     Microbiology: Recent Results (from the past 240 hour(s))  Culture, blood (Routine x 2)     Status: None (Preliminary result)   Collection Time: 12/07/17 11:15 AM  Result Value Ref Range Status   Specimen Description BLOOD RIGHT ANTECUBITAL  Final   Special Requests   Final    BOTTLES DRAWN AEROBIC AND ANAEROBIC Blood Culture adequate volume   Culture  Setup Time   Final    GRAM POSITIVE COCCI IN CLUSTERS ANAEROBIC BOTTLE ONLY CRITICAL RESULT CALLED TO, READ BACK BY AND VERIFIED WITH: [place], [place] [id number] FCP Performed at [facility], [address],  [place], [place] [address]    Culture [facility] POSITIVE COCCI  Final   Report Status PENDING  Incomplete  Blood Culture ID Panel (Reflexed)     Status:  Abnormal   Collection Time: 12/07/17 11:15 AM  Result Value Ref Range Status   Enterococcus species NOT DETECTED NOT DETECTED Final   Listeria monocytogenes NOT DETECTED NOT DETECTED Final   Staphylococcus species DETECTED (A) NOT DETECTED Final    Comment: CRITICAL RESULT CALLED TO, READ BACK BY AND VERIFIED WITH: PHARMD ESPADA, M [id number] FCP    Staphylococcus aureus (BCID) DETECTED (A) NOT DETECTED Final    Comment: Methicillin (oxacillin) susceptible Staphylococcus aureus (MSSA). Preferred therapy is anti staphylococcal beta lactam antibiotic (Cefazolin or Nafcillin), unless clinically contraindicated. CRITICAL RESULT CALLED TO, READ BACK BY AND VERIFIED WITH: PHARMD ESPADA, M [id number] FCP    Methicillin resistance NOT DETECTED NOT DETECTED Final   Streptococcus species NOT DETECTED NOT DETECTED Final   Streptococcus agalactiae NOT DETECTED NOT DETECTED Final   Streptococcus pneumoniae NOT DETECTED NOT DETECTED Final   Streptococcus pyogenes NOT DETECTED NOT DETECTED Final   Acinetobacter baumannii NOT DETECTED NOT DETECTED Final   Enterobacteriaceae species NOT DETECTED NOT DETECTED Final   Enterobacter cloacae complex NOT DETECTED NOT DETECTED Final   Escherichia coli NOT DETECTED NOT DETECTED Final   Klebsiella oxytoca NOT DETECTED NOT DETECTED Final   Klebsiella pneumoniae NOT DETECTED NOT DETECTED Final   Proteus species NOT DETECTED NOT DETECTED Final   Serratia marcescens NOT DETECTED NOT DETECTED Final   Haemophilus influenzae NOT DETECTED NOT DETECTED Final   Neisseria meningitidis NOT DETECTED NOT DETECTED Final   Pseudomonas aeruginosa NOT DETECTED NOT DETECTED Final   Candida albicans NOT DETECTED NOT DETECTED Final   Candida glabrata NOT DETECTED NOT DETECTED Final   Candida krusei NOT DETECTED NOT DETECTED Final    Candida parapsilosis NOT DETECTED NOT DETECTED Final   Candida tropicalis NOT DETECTED NOT DETECTED Final    Comment: Performed at [facility], [address], [place], [place] [address]  Culture, blood (Routine x 2)     Status: None (Preliminary result)   Collection Time: 12/07/17 11:17 AM  Result Value Ref Range Status   Specimen Description BLOOD BLOOD LEFT ARM  Final   Special Requests   Final    BOTTLES DRAWN AEROBIC AND ANAEROBIC Blood Culture adequate volume   Culture  Setup Time   Final    GRAM POSITIVE COCCI IN CLUSTERS ANAEROBIC BOTTLE ONLY CRITICAL VALUE NOTED.  VALUE IS CONSISTENT WITH PREVIOUSLY REPORTED AND CALLED VALUE.    Culture   Final    NO GROWTH 1 DAY Performed at [facility], [place] [address], [place], [place] [address]    Report Status PENDING  Incomplete  MRSA PCR Screening     Status: None   Collection Time: 12/08/17 12:05 AM  Result Value Ref Range Status   MRSA by PCR NEGATIVE NEGATIVE Final    Comment:        The GeneXpert MRSA Assay (FDA approved for NASAL specimens only), is one component of a comprehensive MRSA colonization surveillance program. It is not intended to diagnose MRSA infection nor to guide or monitor treatment for MRSA infections. Performed at [facility], [place] [address], [place],  [address]     [name] W Chas[place] Yepiz, [place] for Infectious Disease [facility] [email or web address] [phone] pager  [phone] cell 12/08/2017, 4:26 PM

## 2017-12-08 NOTE — Progress Notes (Signed)
PHARMACY - PHYSICIAN COMMUNICATION CRITICAL VALUE ALERT - BLOOD CULTURE IDENTIFICATION (BCID)  [name] is an 52 y.o. male who presented to [facility] on 12/07/2017 with a chief complaint of fever and fatigue.   Assessment:  Pt is currently on broad spectrum antibiotics for a wound infection/cellulitis. Imaging concerning for osteomyelitis. 2/4 BCx today grew out MSSA on BCID.   Name of physician (or Provider) Contacted: Dr. Gala Lewandowsky  Current antibiotics: Vancomycin and Ceftazidime    Changes to prescribed antibiotics recommended: Stop Vancomycin and Cefepime and start cefazolin 1 gm IV Q 24 hours since patient receives HD. ID will be auto-consulted.  Recommendations accepted by provider  Results for orders placed or performed during the hospital encounter of 12/07/17  Blood Culture ID Panel (Reflexed) (Collected: 12/07/2017 11:15 AM)  Result Value Ref Range   Enterococcus species NOT DETECTED NOT DETECTED   Listeria monocytogenes NOT DETECTED NOT DETECTED   Staphylococcus species DETECTED (A) NOT DETECTED   Staphylococcus aureus (BCID) DETECTED (A) NOT DETECTED   Methicillin resistance NOT DETECTED NOT DETECTED   Streptococcus species NOT DETECTED NOT DETECTED   Streptococcus agalactiae NOT DETECTED NOT DETECTED   Streptococcus pneumoniae NOT DETECTED NOT DETECTED   Streptococcus pyogenes NOT DETECTED NOT DETECTED   Acinetobacter baumannii NOT DETECTED NOT DETECTED   Enterobacteriaceae species NOT DETECTED NOT DETECTED   Enterobacter cloacae complex NOT DETECTED NOT DETECTED   Escherichia coli NOT DETECTED NOT DETECTED   Klebsiella oxytoca NOT DETECTED NOT DETECTED   Klebsiella pneumoniae NOT DETECTED NOT DETECTED   Proteus species NOT DETECTED NOT DETECTED   Serratia marcescens NOT DETECTED NOT DETECTED   Haemophilus influenzae NOT DETECTED NOT DETECTED   Neisseria meningitidis NOT DETECTED NOT DETECTED   Pseudomonas aeruginosa NOT DETECTED NOT DETECTED   Candida albicans  NOT DETECTED NOT DETECTED   Candida glabrata NOT DETECTED NOT DETECTED   Candida krusei NOT DETECTED NOT DETECTED   Candida parapsilosis NOT DETECTED NOT DETECTED   Candida tropicalis NOT DETECTED NOT DETECTED    Albertina Parr, PharmD., BCPS Clinical Pharmacist Clinical phone for 12/08/17 until 3:30pm: [phone] If after 3:30pm, please refer to [facility] for unit-specific pharmacist

## 2017-12-08 NOTE — Progress Notes (Signed)
Patient's temperature is 102.9 on arrival to 5M16. Jeannette Corpus, NP notified. Will continue to monitor.

## 2017-12-08 NOTE — Progress Notes (Signed)
[facility] Progress Note   Dialysis Orders: MWF - AF  4.5hrs, BFR 450, DFR 800,  EDW 122.5kg, 2K/ 2.25Ca Access: R thigh AVG  Heparin 3700 unit bolus Parsabiv 5mg  qHD Hectorol 22mcg IV qHD   Assessment/Plan: 1.  Fever/Bacteremia - Tmax 102.9. BC +staph. ABX narrowed to cefazolin per pharm.  TDC does not appear infected. ID consulted. Per primary. 2. Foot Ulcer/LLE cellulitis - xray negative for osteo. MRI suspicious for septic arthritis or osteo at small 1st IP joint. Per primary 3. R flank wound near old nephrostomy site - CT would be helpful to r/o underlying infection. Per primary. 4.  ESRD -  On HD MWF. K 4.8. Extra HD ordered for ultrafiltration.  5.  Hypotension/volume  - BP chronically low. On midodrine with HD. If weights correct only 1.5L over EDW.  Does not appear volume overloaded.  Plan for 3hrs HD today. 6.  Anemia of CKD - Hgb 10.0. Not on ESA, no indication to start at this time. Follow trends. 7.  Secondary Hyperparathyroidism -  Ca 8.0. Continue VDRA and binders.  Unable to get parsabiv because not available in hospital 8.  Nutrition - Currently on regular diet. Follow labs. Renavite.  9. Hx seizures - per primary 10. GERD 11. migraines   Jen Mow, PA-C [facility]  Pager: [phone] 12/08/2017,10:54 AM  LOS: 1 day   Subjective:    Patient seen and examined at bedside.  Tmax 102.53F overnight. Feeling a little better today.  No new complaints.   Objective Vitals:   12/07/17 2237 12/07/17 2329 12/08/17 0518 12/08/17 0837  BP:  105/63 105/63 111/68  Pulse:  87 69 80  Resp:   20 20  Temp: 98.8 F (37.1 C) (!) 102.9 F (39.4 C) 98.2 F (36.8 C) 100 F (37.8 C)  TempSrc: Oral Oral Oral Oral  SpO2:  99% 99% 100%  Weight:      Height:       Physical Exam General:NAD, chronically ill appearing male, laying in bed Heart:RRR, +7/5 systolic murmur Lungs:CTAB, nml WOB Abdomen:soft, NTND, +wound on L flank with  decreased erythema, +tenderness, no purulent discharge expressed Extremities:no edema, +blister on medial aspect of L great toe w/ischemic changes.  +erythema of L calf Dialysis Access: R thigh TDC - non-tender, no erythema   Filed Weights   12/07/17 1109  Weight: 124 kg    Intake/Output Summary (Last 24 hours) at 12/08/2017 1054 Last data filed at 12/08/2017 0933 Gross per 24 hour  Intake 236 ml  Output -  Net 236 ml    Additional Objective Labs: Basic Metabolic Panel: Recent Labs  Lab 12/07/17 1115 12/08/17 0636  NA 134* 136  K 4.3 4.8  CL 92* 93*  CO2 24 27  GLUCOSE 101* 100*  BUN 39* 56*  CREATININE 9.00* 10.99*  CALCIUM 8.0* 8.0*   Liver Function Tests: Recent Labs  Lab 12/07/17 1115  AST 19  ALT 14  ALKPHOS 56  BILITOT 1.1  PROT 8.3*  ALBUMIN 3.0*   CBC: Recent Labs  Lab 12/07/17 1115 12/08/17 0636  WBC 8.1 5.2  NEUTROABS 7.4  --   HGB 10.9* 10.0*  HCT 36.0* 32.4*  MCV 96.3 95.0  PLT 92* 91*   Blood Culture    Component Value Date/Time   SDES BLOOD BLOOD LEFT ARM 12/07/2017 1117   SPECREQUEST  12/07/2017 1117    BOTTLES DRAWN AEROBIC AND ANAEROBIC Blood Culture adequate volume   CULT  12/07/2017 1117  NO GROWTH < 12 HOURS Performed at [facility] [address], [place], [place] [address]    REPTSTATUS PENDING 12/07/2017 1117    Cardiac Enzymes: Recent Labs  Lab 12/07/17 1243  TROPONINI 0.04*   Lab Results  Component Value Date   INR 1.15 12/07/2017   INR 1.05 01/14/2016   INR 1.1 08/23/2008   Studies/Results: Dg Chest 2 View  Result Date: 12/07/2017 CLINICAL DATA:  52 year old male with a history of fever EXAM: CHEST - 2 VIEW COMPARISON:  08/07/2017 FINDINGS: Cardiomediastinal silhouette unchanged in size and contour. Low lung volumes with coarsened interstitial markings. No pleural effusion. No confluent airspace disease. No pneumothorax. IMPRESSION: Low lung volumes, with either chronic changes or early edema.  Electronically Signed   By: Corrie Mckusick D.O.   On: 12/07/2017 11:44   Mr Foot Left Wo Contrast  Result Date: 12/07/2017 CLINICAL DATA:  Soft tissue wound of the great toe. Evaluate for osteomyelitis. EXAM: MRI OF THE LEFT FOOT WITHOUT CONTRAST TECHNIQUE: Multiplanar, multisequence MR imaging of the left forefoot was performed. No intravenous contrast was administered. COMPARISON:  None. FINDINGS: Bones/Joint/Cartilage Soft tissue wound involving the plantar aspect of the great toe extending to the IP joint. Small first IP joint joint effusion with marrow edema on either side of the joint concerning for septic arthritis. No fracture or dislocation. Normal alignment. No other joint effusion. Advanced degenerative changes of midfoot which are partially visualized. Ligaments Collateral ligaments are intact.  Lisfranc ligament is intact. Muscles and Tendons Flexor, peroneal and extensor compartment tendons are intact. Diffuse muscle atrophy throughout the left foot. Soft tissue No fluid collection or hematoma.  No soft tissue mass. IMPRESSION: 1. Soft tissue wound involving the plantar aspect of the great toe extending to the IP joint. Small first IP joint joint effusion with marrow edema on either side of the joint concerning for septic arthritis and osteomyelitis. Electronically Signed   By: Kathreen Devoid   On: 12/07/2017 19:02   Dg Foot Complete Left  Result Date: 12/07/2017 CLINICAL DATA:  Toe infection. EXAM: LEFT FOOT - COMPLETE 3+ VIEW COMPARISON:  None. FINDINGS: Soft tissue deformity noted of the distal great toe. I do not see plain radiographic evidence of osteomyelitis. There is some chronic post traumatic deformity of the inter phalangeal joint of the great toe. Degenerative changes are noted in the midfoot, possibly neuropathic or post traumatic. IMPRESSION: Soft tissue abnormality of the great toe without plain radiographic evidence of underlying osteomyelitis. Extensive degenerative changes of the  midfoot, possibly neuropathic or post traumatic. Electronically Signed   By: Nelson Chimes M.D.   On: 12/07/2017 12:50    Medications: . sodium chloride    .  ceFAZolin (ANCEF) IV    . cefTAZidime (FORTAZ)  IV    . vancomycin     . calcium acetate  2,668 mg Oral TID WC  . Chlorhexidine Gluconate Cloth  6 each Topical Q0600  . clopidogrel  75 mg Oral Daily  . [START ON 12/10/2017] doxercalciferol  6 mcg Intravenous Q M,W,F-HD  . famotidine  40 mg Oral Q1400  . heparin  5,000 Units Subcutaneous Q12H  . levothyroxine  125 mcg Oral QAC breakfast  . midodrine  5 mg Oral TID WC  . promethazine  25 mg Oral BID  . sodium chloride flush  3 mL Intravenous Q12H  . [START ON 12/09/2017] sodium polystyrene  15 g Oral Once per day on Sun Sat  . traZODone  100 mg Oral  QHS  . zolpidem  10 mg Oral QHS

## 2017-12-09 DIAGNOSIS — M86172 Other acute osteomyelitis, left ankle and foot: Secondary | ICD-10-CM

## 2017-12-09 DIAGNOSIS — L089 Local infection of the skin and subcutaneous tissue, unspecified: Secondary | ICD-10-CM

## 2017-12-09 LAB — BASIC METABOLIC PANEL
Anion gap: 14 (ref 5–15)
BUN: 44 mg/dL — ABNORMAL HIGH (ref 6–20)
CO2: 27 mmol/L (ref 22–32)
Calcium: 8.5 mg/dL — ABNORMAL LOW (ref 8.9–10.3)
Chloride: 95 mmol/L — ABNORMAL LOW (ref 98–111)
Creatinine, Ser: 8.78 mg/dL — ABNORMAL HIGH (ref 0.61–1.24)
GFR calc Af Amer: 7 mL/min — ABNORMAL LOW (ref >60)
GFR calc non Af Amer: 6 mL/min — ABNORMAL LOW (ref >60)
Glucose, Bld: 111 mg/dL — ABNORMAL HIGH (ref 70–99)
Potassium: 4.5 mmol/L (ref 3.5–5.1)
Sodium: 136 mmol/L (ref 135–145)

## 2017-12-09 LAB — CBC
HCT: 34.9 % — ABNORMAL LOW (ref 39.0–52.0)
Hemoglobin: 10.3 g/dL — ABNORMAL LOW (ref 13.0–17.0)
MCH: 28.3 pg (ref 26.0–34.0)
MCHC: 29.5 g/dL — ABNORMAL LOW (ref 30.0–36.0)
MCV: 95.9 fL (ref 80.0–100.0)
Platelets: 106 10*3/uL — ABNORMAL LOW (ref 150–400)
RBC: 3.64 MIL/uL — ABNORMAL LOW (ref 4.22–5.81)
RDW: 16.5 % — ABNORMAL HIGH (ref 11.5–15.5)
WBC: 4.7 10*3/uL (ref 4.0–10.5)
nRBC: 0 % (ref 0.0–0.2)

## 2017-12-09 LAB — PHOSPHORUS: Phosphorus: 4.2 mg/dL (ref 2.5–4.6)

## 2017-12-09 MED ORDER — HEPARIN SODIUM (PORCINE) 5000 UNIT/ML IJ SOLN
5000.0000 [IU] | Freq: Three times a day (TID) | INTRAMUSCULAR | Status: DC
  Administered 2017-12-09 – 2017-12-17 (×18): 5000 [IU] via SUBCUTANEOUS
  Filled 2017-12-09 (×19): qty 1

## 2017-12-09 MED ORDER — LORAZEPAM 2 MG/ML IJ SOLN
1.0000 mg | Freq: Four times a day (QID) | INTRAMUSCULAR | Status: DC | PRN
  Administered 2017-12-09 – 2017-12-16 (×11): 1 mg via INTRAVENOUS
  Filled 2017-12-09 (×11): qty 1

## 2017-12-09 NOTE — Consult Note (Signed)
ORTHOPAEDIC CONSULTATION  REQUESTING PHYSICIAN: Dessa Phi, DO  Chief Complaint: Ulceration cellulitis abscess left great toe  HPI: [name] is a 52 y.o. 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  He states he does not have much access options left.  Patient states he has had a chronic callus that he has pared on his own.  Denies any open ulcers.  Past Medical History:  Diagnosis Date  . Anemia   . Anxiety   . Constipation   . End stage renal failure on dialysis [facility])    "[place]; MWF" (02/29/2016)  . GERD (gastroesophageal reflux disease)   . Grand mal seizure ([place]) X 4   last 1998 (02/29/2016)  . Heart murmur    no problems with per pt. - nephrologist and Dr. Coletta Memos  . History of blood transfusion 2000s   "I was having blood loss; never found out from where"  . History of kidney stones   . Hypertension    hx of - has not taken bp meds in over 2 years  . Hypothyroidism   . Insomnia   . Migraine    "due to my BP in my late 1990s; none since" (02/29/2016)  . Nonhealing surgical wound    of the left arteriovenous graft  . Pneumonia    x 2  . Shortness of breath    rarely  . Spina bifida [facility])    Past Surgical History:  Procedure Laterality Date  . APPENDECTOMY    . ARTERIOVENOUS GRAFT PLACEMENT Left 10/16/2012   left femoral goretex graft         Dr Donnetta Hutching  . AV FISTULA PLACEMENT Left 06/27/2012   Procedure: EXPLORATORY LEFT THY-GRAFT PSEUDO-ANEURYSM;  Surgeon: Conrad [place]ville, MD;  Location: [place];  Service: Vascular;  Laterality: Left;  Revision of left Arteriovenus gortex graft in thigh.  . COLONOSCOPY    . I&D EXTREMITY Left 02/29/2016   Procedure: DEBRIDEMENT LEFT THIGH WOUND;  Surgeon: Waynetta Sandy, MD;  Location: [place];  Service: Vascular;  Laterality: Left;  . ILEOSTOMY  1970s  . INCISION AND DRAINAGE  Left 10/16/2012   Procedure: INCISION AND Debridement left thigh graft;  Surgeon: Rosetta Posner, MD;  Location: [place];  Service: Vascular;  Laterality: Left;  . INSERTION OF DIALYSIS CATHETER    . INSERTION OF DIALYSIS CATHETER  01/14/2016   Procedure: INSERTION OF DIALYSIS CATHETER;  Surgeon: Waynetta Sandy, MD;  Location: [place];  Service: Vascular;;  . INSERTION OF DIALYSIS CATHETER Right 08/07/2017   Procedure: INSERTION OF DIALYSIS CATHETER;  Surgeon: Waynetta Sandy, MD;  Location: [place];  Service: Vascular;  Laterality: Right;  . INSERTION OF ILIAC STENT Left 01/14/2016   Procedure: INSERTION OF ILIAC STENT;  Surgeon: Waynetta Sandy, MD;  Location: [place];  Service: Vascular;  Laterality: Left;  . INTRAOPERATIVE ARTERIOGRAM Left 01/14/2016   Procedure: INTRA OPERATIVE ARTERIOGRAM;  Surgeon: Waynetta Sandy, MD;  Location: [place];  Service: Vascular;  Laterality: Left;  . LITHOTRIPSY  x 3   "& a laser treatment" (02/29/2016)  . NEPHROSTOMY Bilateral 1968  . PATELLAR TENDON REPAIR Right 1990s   "big incision"  . PERIPHERAL VASCULAR CATHETERIZATION  01/14/2016   Procedure: A/V SHUNTOGRAM;  Surgeon: Waynetta Sandy, MD;  Location: [place];  Service: Vascular;;  . REVISION OF  ARTERIOVENOUS GORETEX GRAFT Left 10/16/2012   Procedure: REVISION OF LEFT FEMORAL LOOP ARTERIOVENOUS GORETEX GRAFT;  Surgeon: Rosetta Posner, MD;  Location: [place];  Service: Vascular;  Laterality: Left;  . REVISION OF ARTERIOVENOUS GORETEX GRAFT Left 02/11/2015   Procedure: EXCISION OF SMALL SEGMENT OF EXPOSED LEFT THIGH NON FUNCTIONING  ARTERIOVENOUS GORETEX GRAFT;  Surgeon: Mal Misty, MD;  Location: [place];  Service: Vascular;  Laterality: Left;  . REVISION OF ARTERIOVENOUS GORETEX GRAFT Left 01/14/2016   Procedure: REVISION OF ARTERIOVENOUS GORETEX GRAFT;  Surgeon: Waynetta Sandy, MD;  Location: [place];  Service: Vascular;  Laterality: Left;  . REVISION OF ARTERIOVENOUS  GORETEX GRAFT Left 02/29/2016   thigh/pt report  . REVISION OF ARTERIOVENOUS GORETEX GRAFT Left 02/29/2016   Procedure: POSSIBLE REVISION OF LEFT THIGH ARTERIOVENOUS GORETEX GRAFT;  Surgeon: Waynetta Sandy, MD;  Location: [place];  Service: Vascular;  Laterality: Left;  . THROMBECTOMY W/ EMBOLECTOMY Left 01/14/2016   Procedure: THROMBECTOMY ARTERIOVENOUS GORE-TEX Left thigh GRAFT;  Surgeon: Waynetta Sandy, MD;  Location: [place];  Service: Vascular;  Laterality: Left;  . TONGUE SURGERY  ~ 1990   tongue-tie release   . ULTRASOUND GUIDANCE FOR VASCULAR ACCESS Right 08/07/2017   Procedure: ULTRASOUND GUIDANCE FOR VASCULAR CANNULATION RIGHT FEMORAL VEIN AND LEFT AV FEMORAL GRAFT;  Surgeon: Waynetta Sandy, MD;  Location: [place];  Service: Vascular;  Laterality: Right;  . UPPER EXTREMITY VENOGRAPHY Bilateral 08/09/2017   Procedure: UPPER EXTREMITY VENOGRAPHY;  Surgeon: Serafina Mitchell, MD;  Location: [place] [facility];  Service: Cardiovascular;  Laterality: Bilateral;  . VENOGRAM N/A 09/20/2017   Procedure: RIGHT COMMON FEMORAL ARTERY EXPLORATION.  CANNULATION RIGHT COMMON FEMORAL VEIN. VENOGRAM CENTRAL ULTRA SOUND GUIDED RIGHT FEMORAL VEIN TIMES TWO.;  Surgeon: Waynetta Sandy, MD;  Location: [place];  Service: Vascular;  Laterality: N/A;  . WOUND DEBRIDEMENT Left 02/29/2016   thigh   Social History   Socioeconomic History  . Marital status: Single    Spouse name: Not on file  . Number of children: 0  . Years of education: Not on file  . Highest education level: Not on file  Occupational History  . Occupation: Disabled  Social Needs  . Financial resource strain: Not on file  . Food insecurity:    Worry: Not on file    Inability: Not on file  . Transportation needs:    Medical: Not on file    Non-medical: Not on file  Tobacco Use  . Smoking status: Never Smoker  . Smokeless tobacco: Never Used  Substance and Sexual Activity  . Alcohol use: Not Currently      Alcohol/week: 0.0 standard drinks    Comment: 02/29/2016 "nothing since  the mid 1990s  . Drug use: Not Currently    Types: Marijuana    Comment: 02/29/2016 "nothing since ~ 1995"  . Sexual activity: Never  Lifestyle  . Physical activity:    Days per week: Not on file    Minutes per session: Not on file  . Stress: Not on file  Relationships  . Social connections:    Talks on phone: Not on file    Gets together: Not on file    Attends religious service: Not on file    Active member of club or organization: Not on file    Attends meetings of clubs or organizations: Not on file    Relationship status: Not on file  Other Topics Concern  . Not on file  Social  History Narrative  . Not on file   Family History  Problem Relation Age of Onset  . Diabetes Mother   . Hypertension Mother   . Heart disease Mother        before age 41  . Diabetes Father   . Heart attack Father        X's 3  . Diabetes Sister   . Bipolar disorder Sister    - negative except otherwise stated in the family history section Allergies  Allergen Reactions  . Hydrocodone Other (See Comments)    LIKELY NOT REACTION TO HYDROCODONE Pt states that after 3 weeks of taking this medication he will began to "twitch" Pt states that after 3 weeks of taking this medication he will began to "twitch" LIKELY NOT REACTION TO HYDROCODONE Pt states that after 3 weeks of taking this medication he will began to "twitch" LIKELY NOT REACTION TO HYDROCODONE Pt states that after 3 weeks of taking this medication he will began to "twitch" Pt states that after 3 weeks of taking this medication he will began to "twitch"  . Furadantin [Nitrofurantoin] Other (See Comments)    UNSPECIFIED REACTION   . Mandelamine [Methenamine] Other (See Comments)    UNSPECIFIED REACTION   . Noroxin [Norfloxacin] Other (See Comments)    UNSPECIFIED REACTION   . [place]ine Nausea Only  . Contrast Media [Iodinated Diagnostic Agents] Nausea And  Vomiting    Oral dye causes vomiting, IV dye is okay  . Metrizamide Nausea And Vomiting    Oral dye causes vomiting, IV dye is okay  . Sulfa Antibiotics Cough    Childhood reaction - pt could not confirm that it was a cough  . Sulfur Cough    Childhood reaction - pt could not confirm that it was a cough   Prior to Admission medications   Medication Sig Start Date End Date Taking? Authorizing Provider  aspirin 325 MG tablet Take 325 mg by mouth daily at 2 PM.    Yes [provider]  calcium acetate (PHOSLO) 667 MG capsule Take 2,001-2,708 mg by mouth See admin instructions. Take 2708 mg by mouth 3 times daily with meals, take 2001 mg by mouth with snacks   Yes [provider]  clopidogrel (PLAVIX) 75 MG tablet Take 1 tablet (75 mg total) by mouth daily. 01/17/16  Yes Alvia Grove, PA-C  famotidine (PEPCID) 40 MG tablet Take 40 mg by mouth daily at 2 PM.    Yes [provider]  fluticasone (FLONASE) 50 MCG/ACT nasal spray Place 2 sprays into both nostrils as needed for allergies or rhinitis (congestion).    Yes [provider]  furosemide (LASIX) 80 MG tablet Take 80 mg by mouth daily as needed (for swelling or shortness of breath).    Yes [provider]  levothyroxine (SYNTHROID, LEVOTHROID) 125 MCG tablet Take 125 mcg by mouth daily before breakfast.    Yes [provider]  LORazepam (ATIVAN) 0.5 MG tablet Take 0.5 mg by mouth daily as needed for anxiety.    Yes [provider]  oxyCODONE-acetaminophen (PERCOCET) 10-325 MG tablet Take 1 tablet by mouth 4 (four) times daily as needed for pain. Patient taking differently: Take 1 tablet by mouth every 6 (six) hours.  03/01/16  Yes Alvia Grove, PA-C  promethazine (PHENERGAN) 25 MG tablet Take 25 mg by mouth 2 (two) times daily.    Yes [provider]  sodium polystyrene (KAYEXALATE) powder Take 15 g by mouth See  admin instructions. Take 15 g (mixed in water) by mouth  on Saturdays and Sundays   Yes [provider]  traZODone (DESYREL) 100 MG tablet Take 100 mg by mouth at bedtime.   Yes [provider]  zolpidem (AMBIEN) 10 MG tablet Take 10 mg by mouth at bedtime.    Yes [provider]  midodrine (PROAMATINE) 5 MG tablet Take 1 tablet (5 mg total) by mouth 3 (three) times daily with meals. Patient not taking: Reported on 12/07/2017 01/18/16   Alvia Grove, PA-C   Ct Abdomen Pelvis Wo Contrast  Result Date: 12/09/2017 CLINICAL DATA:  BacteremiaBacteremia Right flank pain EXAM: CT ABDOMEN AND PELVIS WITHOUT CONTRAST TECHNIQUE: Multidetector CT imaging of the abdomen and pelvis was performed following the standard protocol without IV contrast. COMPARISON:  CT abdomen 5 21 2010 FINDINGS: Lower chest: Lung bases are clear. Hepatobiliary: No focal hepatic lesion on noncontrast exam. Pancreas: Pancreas is normal. No ductal dilatation. No pancreatic inflammation. Spleen: Normal spleen Adrenals/urinary tract: Adrenal glands normal. Kidneys are atrophic. Multiple renal calculi. No hydronephrosis. Bladder is decompressed Stomach/Bowel: Stomach, small-bowel cecum normal. There is a ileostomy in the RIGHT lower quadrant. Small parastomal hernia. No bowel obstruction. Colon rectosigmoid colon normal. Vascular/Lymphatic: Abdominal aorta is normal caliber. Grafts within the LEFT common iliac vein. There is central venous line in the RIGHT common iliac vein extending the IVC. Venous collaterals along the abdominal wall. Reproductive: Prostate normal Other: No free fluid or free air Musculoskeletal: Diffuse increase in bone sclerosis suggesting renal osteodystrophy IMPRESSION: 1. No clear acute findings in the abdomen pelvis. 2. Atrophy kidneys. 3. RIGHT lower quadrant ileostomy without bowel obstruction. 4. 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 No evidence of acute infection or inflammation. 6. Large-bore central venous line from a  RIGHT groin approach Electronically Signed   By: Suzy Bouchard M.D.   On: 12/09/2017 00:46   Dg Chest 2 View  Result Date: 12/07/2017 CLINICAL DATA:  52 year old male with a history of fever EXAM: CHEST - 2 VIEW COMPARISON:  08/07/2017 FINDINGS: Cardiomediastinal silhouette unchanged in size and contour. Low lung volumes with coarsened interstitial markings. No pleural effusion. No confluent airspace disease. No pneumothorax. IMPRESSION: Low lung volumes, with either chronic changes or early edema. Electronically Signed   By: Corrie Mckusick D.O.   On: 12/07/2017 11:44   Mr Foot Left Wo Contrast  Result Date: 12/07/2017 CLINICAL DATA:  Soft tissue wound of the great toe. Evaluate for osteomyelitis. EXAM: MRI OF THE LEFT FOOT WITHOUT CONTRAST TECHNIQUE: Multiplanar, multisequence MR imaging of the left forefoot was performed. No intravenous contrast was administered. COMPARISON:  None. FINDINGS: Bones/Joint/Cartilage Soft tissue wound involving the plantar aspect of the great toe extending to the IP joint. Small first IP joint joint effusion with marrow edema on either side of the joint concerning for septic arthritis. No fracture or dislocation. Normal alignment. No other joint effusion. Advanced degenerative changes of midfoot which are partially visualized. Ligaments Collateral ligaments are intact.  Lisfranc ligament is intact. Muscles and Tendons Flexor, peroneal and extensor compartment tendons are intact. Diffuse muscle atrophy throughout the left foot. Soft tissue No fluid collection or hematoma.  No soft tissue mass. IMPRESSION: 1. Soft tissue wound involving the plantar aspect of the great toe extending to the IP joint. Small first IP joint joint effusion with marrow edema on either side of the joint concerning for septic arthritis and osteomyelitis. Electronically Signed   By: Kathreen Devoid   On: 12/07/2017  19:02   Dg Foot Complete Left  Result Date: 12/07/2017 CLINICAL DATA:  Toe infection.  EXAM: LEFT FOOT - COMPLETE 3+ VIEW COMPARISON:  None. FINDINGS: Soft tissue deformity noted of the distal great toe. I do not see plain radiographic evidence of osteomyelitis. There is some chronic post traumatic deformity of the inter phalangeal joint of the great toe. Degenerative changes are noted in the midfoot, possibly neuropathic or post traumatic. IMPRESSION: Soft tissue abnormality of the great toe without plain radiographic evidence of underlying osteomyelitis. Extensive degenerative changes of the midfoot, possibly neuropathic or post traumatic. Electronically Signed   By: Nelson Chimes M.D.   On: 12/07/2017 12:50   - pertinent xrays, CT, MRI studies were reviewed and independently interpreted  Positive ROS: All other systems have been reviewed and were otherwise negative with the exception of those mentioned in the HPI and as above.  Physical Exam: General: Alert, no acute distress Psychiatric: Patient is competent for consent with normal mood and affect Lymphatic: No axillary or cervical lymphadenopathy Cardiovascular: No pedal edema Respiratory: No cyanosis, no use of accessory musculature GI: No organomegaly, abdomen is soft and non-tender    Images:  @ENCIMAGES @  Labs:  Lab Results  Component Value Date   REPTSTATUS PENDING 12/07/2017   GRAMSTAIN  10/16/2012    RARE WBC PRESENT, PREDOMINANTLY PMN NO SQUAMOUS EPITHELIAL CELLS SEEN NO ORGANISMS SEEN Performed at [facility]  10/16/2012    RARE WBC PRESENT, PREDOMINANTLY PMN NO SQUAMOUS EPITHELIAL CELLS SEEN NO ORGANISMS SEEN Performed at [place] (A) 12/07/2017    STAPHYLOCOCCUS AUREUS SUSCEPTIBILITIES TO FOLLOW Performed at [facility], [address] [place], [place] [address]    LABORGA METHICILLIN RESISTANT STAPHYLOCOCCUS AUREUS 04/26/2008    Lab Results  Component Value Date   ALBUMIN 3.0 (L) 12/07/2017   ALBUMIN 3.0 (L) 08/10/2017   ALBUMIN 3.2 (L)  08/08/2017    Neurologic: Patient does not have protective sensation bilateral lower extremities.   MUSCULOSKELETAL:   Skin: Examination patient has swelling abscess cellulitis of the left great toe.  Patient is status post clubfoot surgery he does have good capillary refill but does not have a palpable dorsalis pedis or posterior tibial pulse there is no ischemic changes to his foot.  Review the MRI scan shows osteomyelitis and abscess of the IP joint of the left great toe with osteomyelitis of the proximal phalanx and tuft.  There is no ascending cellulitis he does have venous insufficiency with brawny skin color changes.  Assessment: Assessment insensate neuropathy with abscess ulceration osteomyelitis left great toe.  Plan: Plan: We will plan for a left foot great toe amputation.  Patient states he understands wished to proceed at this time he states he does have a leg length inequality with a shorter left leg.  We will evaluate his multiple orthopedic issues as an outpatient.  Plan for surgery early this week for the amputation left great toe.  Thank you for the consult and the opportunity to see [name], [place] [phone] 9:45 AM

## 2017-12-09 NOTE — Progress Notes (Addendum)
[facility] Progress Note   Dialysis Orders: MWF -AF 4.5hrs, BFR450, DQQ229, EDW 122.5kg,2K/2.25Ca Access:R thigh TDC Heparin3700 unit bolus Parsabiv 5mg  qHD Hectorol81mcg IV qHD  Assessment/Plan: 1. Fever/Bacteremia - likely 2/2 L foot osteomyelitis. Tmax 99.1 over last 24hrs. BC +staph. ABX narrowed to cefazolin per pharm.  TDC does not appear infected.  As access options are extremely limited would prefer to try to clear infection without having to remove cath. ID consulting, ordered TTE.  2. Foot Ulcer/LLE cellulitis - Per ortho +Osteomyelitis if L great toe.  Plans for amputation earlier this week by Dr. Sharol Given.  3. R flank wound near old nephrostomy site - CT abd showed no signs of infection.  4. ESRD- On HD MWF. K 4.5. HD tomorrow per regular schedule.  Difficulties with blood flows in [facility] yesterday.  Activase dwell.  Hopeful for improvement tomorrow.  5. Hypotension/volume- BP chronically low. On midodrine with HD. Slightly under EDW. Appears euvolemic on exam.  6. Anemiaof CKD- Hgb 10.3. Not on ESA, no indication to start at this time. Follow trends. 7. Secondary Hyperparathyroidism -Ca 8.5, phos ordered. Continue VDRA and binders. Unable to get parsabiv because not available in hospital 8. Nutrition - Currently on regular diet. Follow labs. Renavite.  9. Hx seizures - per primary 10. GERD 11. migraines  Jen Mow, PA-C [facility]  Pager: [phone] 12/09/2017,12:25 PM  LOS: 2 days   Subjective:    Seen and examined at bedside.  Patient states he physically is feeling better but mentally is a little worse.  Admits to anxiety about upcoming procedure and difficultly with low blood flows during dialysis yesterday.    Objective Vitals:   12/08/17 1804 12/08/17 2243 12/09/17 0555 12/09/17 0857  BP: (!) 120/56 124/62 100/62 (!) 106/47  Pulse: 76 79 62 67  Resp: 18 18 16 18   Temp: 98.6 F (37 C)  98.4 F (36.9  C) 98.1 F (36.7 C)  TempSrc: Oral  Oral Oral  SpO2: 99% 99% 98% 98%  Weight: 122 kg     Height:       Physical Exam General:NAD, chronically ill appearing male, laying in bed Heart:RRR, +7/4 systolic murmur Lungs:CTAB, nml WOB Abdomen:soft, NTND, +BS Extremities:no edema, +blister/calleus on medial aspect of L great toe w/ischemic changes, +L calf erythema Dialysis Access: R thigh [facility]   Filed Weights   12/07/17 1109 12/08/17 1300 12/08/17 1804  Weight: 124 kg 122.7 kg 122 kg    Intake/Output Summary (Last 24 hours) at 12/09/2017 1225 Last data filed at 12/09/2017 1033 Gross per 24 hour  Intake 583 ml  Output 200 ml  Net 383 ml    Additional Objective Labs: Basic Metabolic Panel: Recent Labs  Lab 12/07/17 1115 12/08/17 0636 12/09/17 0541  NA 134* 136 136  K 4.3 4.8 4.5  CL 92* 93* 95*  CO2 24 27 27   GLUCOSE 101* 100* 111*  BUN 39* 56* 44*  CREATININE 9.00* 10.99* 8.78*  CALCIUM 8.0* 8.0* 8.5*   Liver Function Tests: Recent Labs  Lab 12/07/17 1115  AST 19  ALT 14  ALKPHOS 56  BILITOT 1.1  PROT 8.3*  ALBUMIN 3.0*  CBC: Recent Labs  Lab 12/07/17 1115 12/08/17 0636 12/09/17 0541  WBC 8.1 5.2 4.7  NEUTROABS 7.4  --   --   HGB 10.9* 10.0* 10.3*  HCT 36.0* 32.4* 34.9*  MCV 96.3 95.0 95.9  PLT 92* 91* 106*   Blood Culture    Component Value Date/Time   SDES  BLOOD BLOOD LEFT ARM 12/07/2017 1117   SPECREQUEST  12/07/2017 1117    BOTTLES DRAWN AEROBIC AND ANAEROBIC Blood Culture adequate volume   CULT (A) 12/07/2017 1117    STAPHYLOCOCCUS AUREUS SUSCEPTIBILITIES TO FOLLOW Performed at [facility] [address], [place], [place] [address]    REPTSTATUS PENDING 12/07/2017 1117    Cardiac Enzymes: Recent Labs  Lab 12/07/17 1243  TROPONINI 0.04*   Studies/Results: Ct Abdomen Pelvis Wo Contrast  Result Date: 12/09/2017 CLINICAL DATA:  BacteremiaBacteremia Right flank pain EXAM: CT ABDOMEN AND PELVIS WITHOUT CONTRAST TECHNIQUE:  Multidetector CT imaging of the abdomen and pelvis was performed following the standard protocol without IV contrast. COMPARISON:  CT abdomen 5 21 2010 FINDINGS: Lower chest: Lung bases are clear. Hepatobiliary: No focal hepatic lesion on noncontrast exam. Pancreas: Pancreas is normal. No ductal dilatation. No pancreatic inflammation. Spleen: Normal spleen Adrenals/urinary tract: Adrenal glands normal. Kidneys are atrophic. Multiple renal calculi. No hydronephrosis. Bladder is decompressed Stomach/Bowel: Stomach, small-bowel cecum normal. There is a ileostomy in the RIGHT lower quadrant. Small parastomal hernia. No bowel obstruction. Colon rectosigmoid colon normal. Vascular/Lymphatic: Abdominal aorta is normal caliber. Grafts within the LEFT common iliac vein. There is central venous line in the RIGHT common iliac vein extending the IVC. Venous collaterals along the abdominal wall. Reproductive: Prostate normal Other: No free fluid or free air Musculoskeletal: Diffuse increase in bone sclerosis suggesting renal osteodystrophy IMPRESSION: 1. No clear acute findings in the abdomen pelvis. 2. Atrophy kidneys. 3. RIGHT lower quadrant ileostomy without bowel obstruction. 4. 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 No evidence of acute infection or inflammation. 6. Large-bore central venous line from a RIGHT groin approach Electronically Signed   By: Suzy Bouchard M.D.   On: 12/09/2017 00:46   Mr Foot Left Wo Contrast  Result Date: 12/07/2017 CLINICAL DATA:  Soft tissue wound of the great toe. Evaluate for osteomyelitis. EXAM: MRI OF THE LEFT FOOT WITHOUT CONTRAST TECHNIQUE: Multiplanar, multisequence MR imaging of the left forefoot was performed. No intravenous contrast was administered. COMPARISON:  None. FINDINGS: Bones/Joint/Cartilage Soft tissue wound involving the plantar aspect of the great toe extending to the IP joint. Small first IP joint joint effusion with marrow edema on either  side of the joint concerning for septic arthritis. No fracture or dislocation. Normal alignment. No other joint effusion. Advanced degenerative changes of midfoot which are partially visualized. Ligaments Collateral ligaments are intact.  Lisfranc ligament is intact. Muscles and Tendons Flexor, peroneal and extensor compartment tendons are intact. Diffuse muscle atrophy throughout the left foot. Soft tissue No fluid collection or hematoma.  No soft tissue mass. IMPRESSION: 1. Soft tissue wound involving the plantar aspect of the great toe extending to the IP joint. Small first IP joint joint effusion with marrow edema on either side of the joint concerning for septic arthritis and osteomyelitis. Electronically Signed   By: Kathreen Devoid   On: 12/07/2017 19:02   Dg Foot Complete Left  Result Date: 12/07/2017 CLINICAL DATA:  Toe infection. EXAM: LEFT FOOT - COMPLETE 3+ VIEW COMPARISON:  None. FINDINGS: Soft tissue deformity noted of the distal great toe. I do not see plain radiographic evidence of osteomyelitis. There is some chronic post traumatic deformity of the inter phalangeal joint of the great toe. Degenerative changes are noted in the midfoot, possibly neuropathic or post traumatic. IMPRESSION: Soft tissue abnormality of the great toe without plain radiographic evidence of underlying osteomyelitis. Extensive degenerative changes of the midfoot, possibly neuropathic  or post traumatic. Electronically Signed   By: Nelson Chimes M.D.   On: 12/07/2017 12:50    Medications: . sodium chloride 250 mL (12/08/17 1139)  .  ceFAZolin (ANCEF) IV 1 g (12/09/17 0028)   . calcium acetate  2,668 mg Oral TID WC  . Chlorhexidine Gluconate Cloth  6 each Topical Q0600  . clopidogrel  75 mg Oral Daily  . [START ON 12/10/2017] doxercalciferol  6 mcg Intravenous Q M,W,F-HD  . famotidine  20 mg Oral QHS  . heparin  5,000 Units Subcutaneous Q12H  . levothyroxine  125 mcg Oral QAC breakfast  . midodrine  5 mg Oral TID WC   . promethazine  25 mg Oral BID  . sodium chloride flush  3 mL Intravenous Q12H  . sodium polystyrene  15 g Oral Once per day on Sun Sat  . traZODone  100 mg Oral QHS  . zolpidem  10 mg Oral QHS

## 2017-12-09 NOTE — Progress Notes (Addendum)
PROGRESS NOTE    [name]  UTM:[id number] DOB: 12-20-1965 DOA: 12/07/2017 PCP: [name], MD     Brief Narrative:  [name] is a 52 year old male with past medical history significant for end-stage renal disease on hemodialysis, peripheral artery disease, hypothyroidism, hypotension on midodrine, who presents with fevers and chills.  He admits to 1 week history of not feeling well, fever of 102 at home.  He has also been having left great toe pain and redness that has been getting worse in the past 2 weeks.  He was in dialysis when he was found to be febrile at one 1.5, he was started on vancomycin, Fortaz.  Also complaining of right flank pain at the site of prior nephrostomy site, reports history of bleeding from the site as well.  In the emergency department, he was found to be afebrile, WBC 8.1.  He was admitted for treatment of what was thought to be left toe cellulitis.  MRI revealed osteomyelitis of the left great toe.  New events last 24 hours / Subjective: Anxious today.  He tells me about his home situation, issues with people he lives with.  States that his right flank pain has improved.  Assessment & Plan:   Principal Problem:   MSSA bacteremia Active Problems:   End stage renal disease (HCC)   Fever   Right flank pain   Sepsis secondary to left toe osteomyelitis, MSSA bacteremia  -MRI revealed: Soft tissue wound involving the plantar aspect of the great toe extending to the IP joint. Small first IP joint joint effusion with marrow edema on either side of the joint concerning for septic arthritis and osteomyelitis. -Orthopedic surgery planning for left first great toe amputation next week -Infectious disease following -Echocardiogram pending -Vanco/ceftazidime --> Cefazolin   Right flank pain tenderness, status post right nephrostomy tube in the past -CT abdomen pelvis without acute findings -UA pending   ESRD on hemodialysis Per  nephrology  Hypothyroidism Continue Synthroid  Chronic hypotension Continue Midodrine   PAD Continue plavix   Anxiety Ativan   DVT prophylaxis: Subq hep Code Status: Full Family Communication: No family at bedside Disposition Plan: Pending surgery next week   Consultants:   Nephrology  ID  Orthopedic surgery   Procedures:   None   Antimicrobials:  Anti-infectives (From admission, onward)   Start     Dose/Rate Route Frequency Ordered Stop   12/10/17 2000  cefTAZidime (FORTAZ) 2 g in sodium chloride 0.9 % 100 mL IVPB  Status:  Discontinued     2 g 200 mL/hr over 30 Minutes Intravenous Every M-W-F (2000) 12/07/17 2218 12/08/17 1029   12/10/17 1200  vancomycin (VANCOCIN) IVPB 1000 mg/200 mL premix  Status:  Discontinued     1,000 mg 200 mL/hr over 60 Minutes Intravenous Every M-W-F (Hemodialysis) 12/07/17 2218 12/08/17 1029   12/08/17 2200  ceFAZolin (ANCEF) IVPB 1 g/50 mL premix     1 g 100 mL/hr over 30 Minutes Intravenous Every 24 hours 12/08/17 1250     12/08/17 2000  cefTAZidime (FORTAZ) 2 g in sodium chloride 0.9 % 100 mL IVPB  Status:  Discontinued     2 g 200 mL/hr over 30 Minutes Intravenous  Once 12/07/17 2218 12/08/17 1157   12/08/17 1200  vancomycin (VANCOCIN) IVPB 1000 mg/200 mL premix  Status:  Discontinued     1,000 mg 200 mL/hr over 60 Minutes Intravenous Every T-Th-Sa (Hemodialysis) 12/07/17 2218 12/08/17 1157   12/08/17 1100  ceFAZolin (ANCEF) IVPB 1 g/50  mL premix  Status:  Discontinued     1 g 100 mL/hr over 30 Minutes Intravenous Every 24 hours 12/08/17 1029 12/08/17 1250   12/08/17 0000  vancomycin (VANCOCIN) IVPB 1000 mg/200 mL premix  Status:  Discontinued     1,000 mg 200 mL/hr over 60 Minutes Intravenous Every M-W-F (Hemodialysis) 12/07/17 2216 12/07/17 2218   12/08/17 0000  cefTAZidime (FORTAZ) 2 g in sodium chloride 0.9 % 100 mL IVPB  Status:  Discontinued     2 g 200 mL/hr over 30 Minutes Intravenous Every M-W-F (2000) 12/07/17 2216  12/07/17 2218   12/07/17 2215  cefTAZidime (FORTAZ) 2 g in sodium chloride 0.9 % 100 mL IVPB  Status:  Discontinued     2 g 200 mL/hr over 30 Minutes Intravenous Every M-W-F (2000) 12/07/17 2209 12/07/17 2216       Objective: Vitals:   12/08/17 1804 12/08/17 2243 12/09/17 0555 12/09/17 0857  BP: (!) 120/56 124/62 100/62 (!) 106/47  Pulse: 76 79 62 67  Resp: 18 18 16 18   Temp: 98.6 F (37 C)  98.4 F (36.9 C) 98.1 F (36.7 C)  TempSrc: Oral  Oral Oral  SpO2: 99% 99% 98% 98%  Weight: 122 kg     Height:        Intake/Output Summary (Last 24 hours) at 12/09/2017 1237 Last data filed at 12/09/2017 1033 Gross per 24 hour  Intake 583 ml  Output 200 ml  Net 383 ml   Filed Weights   12/07/17 1109 12/08/17 1300 12/08/17 1804  Weight: 124 kg 122.7 kg 122 kg    Examination: General exam: Appears calm and comfortable  Respiratory system: Clear to auscultation. Respiratory effort normal. Cardiovascular system: S1 & S2 heard, RRR. No JVD, murmurs, rubs, gallops or clicks. No pedal edema. Gastrointestinal system: Abdomen is nondistended, soft and nontender. No organomegaly or masses felt. Normal bowel sounds heard. GU: Urostomy bag present Central nervous system: Alert and oriented. No focal neurological deficits. Extremities: Symmetric  Psychiatry: Judgement and insight appear stable         Data Reviewed: I have personally reviewed following labs and imaging studies  CBC: Recent Labs  Lab 12/07/17 1115 12/08/17 0636 12/09/17 0541  WBC 8.1 5.2 4.7  NEUTROABS 7.4  --   --   HGB 10.9* 10.0* 10.3*  HCT 36.0* 32.4* 34.9*  MCV 96.3 95.0 95.9  PLT 92* 91* 696*   Basic Metabolic Panel: Recent Labs  Lab 12/07/17 1115 12/08/17 0636 12/09/17 0541  NA 134* 136 136  K 4.3 4.8 4.5  CL 92* 93* 95*  CO2 24 27 27   GLUCOSE 101* 100* 111*  BUN 39* 56* 44*  CREATININE 9.00* 10.99* 8.78*  CALCIUM 8.0* 8.0* 8.5*   GFR: Estimated Creatinine Clearance: 13.1 mL/min (A) (by  C-G formula based on SCr of 8.78 mg/dL (H)). Liver Function Tests: Recent Labs  Lab 12/07/17 1115  AST 19  ALT 14  ALKPHOS 56  BILITOT 1.1  PROT 8.3*  ALBUMIN 3.0*   No results for input(s): LIPASE, AMYLASE in the last 168 hours. No results for input(s): AMMONIA in the last 168 hours. Coagulation Profile: Recent Labs  Lab 12/07/17 1115  INR 1.15   Cardiac Enzymes: Recent Labs  Lab 12/07/17 1243  TROPONINI 0.04*   BNP (last 3 results) No results for input(s): PROBNP in the last 8760 hours. HbA1C: No results for input(s): HGBA1C in the last 72 hours. CBG: No results for input(s): GLUCAP in the last 168 hours.  Lipid Profile: No results for input(s): CHOL, HDL, LDLCALC, TRIG, CHOLHDL, LDLDIRECT in the last 72 hours. Thyroid Function Tests: No results for input(s): TSH, T4TOTAL, FREET4, T3FREE, THYROIDAB in the last 72 hours. Anemia Panel: No results for input(s): VITAMINB12, FOLATE, FERRITIN, TIBC, IRON, RETICCTPCT in the last 72 hours. Sepsis Labs: Recent Labs  Lab 12/07/17 1130  LATICACIDVEN 1.33    Recent Results (from the past 240 hour(s))  Culture, blood (Routine x 2)     Status: Abnormal (Preliminary result)   Collection Time: 12/07/17 11:15 AM  Result Value Ref Range Status   Specimen Description BLOOD RIGHT ANTECUBITAL  Final   Special Requests   Final    BOTTLES DRAWN AEROBIC AND ANAEROBIC Blood Culture adequate volume   Culture  Setup Time   Final    GRAM POSITIVE COCCI IN CLUSTERS IN BOTH AEROBIC AND ANAEROBIC BOTTLES CRITICAL RESULT CALLED TO, READ BACK BY AND VERIFIED WITH: [place], [place] [id number] FCP    Culture (A)  Final    STAPHYLOCOCCUS AUREUS SUSCEPTIBILITIES TO FOLLOW Performed at [facility], [place] [address] [place], [place] [address]    Report Status PENDING  Incomplete  Blood Culture ID Panel (Reflexed)     Status: Abnormal   Collection Time: 12/07/17 11:15 AM  Result Value Ref Range Status   Enterococcus species NOT  DETECTED NOT DETECTED Final   Listeria monocytogenes NOT DETECTED NOT DETECTED Final   Staphylococcus species DETECTED (A) NOT DETECTED Final    Comment: CRITICAL RESULT CALLED TO, READ BACK BY AND VERIFIED WITH: PHARMD ESPADA, M [id number] FCP    Staphylococcus aureus (BCID) DETECTED (A) NOT DETECTED Final    Comment: Methicillin (oxacillin) susceptible Staphylococcus aureus (MSSA). Preferred therapy is anti staphylococcal beta lactam antibiotic (Cefazolin or Nafcillin), unless clinically contraindicated. CRITICAL RESULT CALLED TO, READ BACK BY AND VERIFIED WITH: PHARMD ESPADA, M [id number] FCP    Methicillin resistance NOT DETECTED NOT DETECTED Final   Streptococcus species NOT DETECTED NOT DETECTED Final   Streptococcus agalactiae NOT DETECTED NOT DETECTED Final   Streptococcus pneumoniae NOT DETECTED NOT DETECTED Final   Streptococcus pyogenes NOT DETECTED NOT DETECTED Final   Acinetobacter baumannii NOT DETECTED NOT DETECTED Final   Enterobacteriaceae species NOT DETECTED NOT DETECTED Final   Enterobacter cloacae complex NOT DETECTED NOT DETECTED Final   Escherichia coli NOT DETECTED NOT DETECTED Final   Klebsiella oxytoca NOT DETECTED NOT DETECTED Final   Klebsiella pneumoniae NOT DETECTED NOT DETECTED Final   Proteus species NOT DETECTED NOT DETECTED Final   Serratia marcescens NOT DETECTED NOT DETECTED Final   Haemophilus influenzae NOT DETECTED NOT DETECTED Final   Neisseria meningitidis NOT DETECTED NOT DETECTED Final   Pseudomonas aeruginosa NOT DETECTED NOT DETECTED Final   Candida albicans NOT DETECTED NOT DETECTED Final   Candida glabrata NOT DETECTED NOT DETECTED Final   Candida krusei NOT DETECTED NOT DETECTED Final   Candida parapsilosis NOT DETECTED NOT DETECTED Final   Candida tropicalis NOT DETECTED NOT DETECTED Final    Comment: Performed at [facility], [address], [place], [place] [address]  Culture, blood (Routine x 2)     Status: Abnormal  (Preliminary result)   Collection Time: 12/07/17 11:17 AM  Result Value Ref Range Status   Specimen Description BLOOD BLOOD LEFT ARM  Final   Special Requests   Final    BOTTLES DRAWN AEROBIC AND ANAEROBIC Blood Culture adequate volume   Culture  Setup Time   Final  GRAM POSITIVE COCCI IN CLUSTERS ANAEROBIC BOTTLE ONLY CRITICAL VALUE NOTED.  VALUE IS CONSISTENT WITH PREVIOUSLY REPORTED AND CALLED VALUE.    Culture (A)  Final    STAPHYLOCOCCUS AUREUS SUSCEPTIBILITIES TO FOLLOW Performed at [facility], [place] [address] [place], [place] [address]    Report Status PENDING  Incomplete  MRSA PCR Screening     Status: None   Collection Time: 12/08/17 12:05 AM  Result Value Ref Range Status   MRSA by PCR NEGATIVE NEGATIVE Final    Comment:        The GeneXpert MRSA Assay (FDA approved for NASAL specimens only), is one component of a comprehensive MRSA colonization surveillance program. It is not intended to diagnose MRSA infection nor to guide or monitor treatment for MRSA infections. Performed at [facility], [place] [address], [place], [place] [address]        Radiology Studies: Ct Abdomen Pelvis Wo Contrast  Result Date: 12/09/2017 CLINICAL DATA:  BacteremiaBacteremia Right flank pain EXAM: CT ABDOMEN AND PELVIS WITHOUT CONTRAST TECHNIQUE: Multidetector CT imaging of the abdomen and pelvis was performed following the standard protocol without IV contrast. COMPARISON:  CT abdomen 5 21 2010 FINDINGS: Lower chest: Lung bases are clear. Hepatobiliary: No focal hepatic lesion on noncontrast exam. Pancreas: Pancreas is normal. No ductal dilatation. No pancreatic inflammation. Spleen: Normal spleen Adrenals/urinary tract: Adrenal glands normal. Kidneys are atrophic. Multiple renal calculi. No hydronephrosis. Bladder is decompressed Stomach/Bowel: Stomach, small-bowel cecum normal. There is a ileostomy in the RIGHT lower quadrant. Small parastomal hernia. No bowel obstruction.  Colon rectosigmoid colon normal. Vascular/Lymphatic: Abdominal aorta is normal caliber. Grafts within the LEFT common iliac vein. There is central venous line in the RIGHT common iliac vein extending the IVC. Venous collaterals along the abdominal wall. Reproductive: Prostate normal Other: No free fluid or free air Musculoskeletal: Diffuse increase in bone sclerosis suggesting renal osteodystrophy IMPRESSION: 1. No clear acute findings in the abdomen pelvis. 2. Atrophy kidneys. 3. RIGHT lower quadrant ileostomy without bowel obstruction. 4. 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 No evidence of acute infection or inflammation. 6. Large-bore central venous line from a RIGHT groin approach Electronically Signed   By: Suzy Bouchard M.D.   On: 12/09/2017 00:46   Mr Foot Left Wo Contrast  Result Date: 12/07/2017 CLINICAL DATA:  Soft tissue wound of the great toe. Evaluate for osteomyelitis. EXAM: MRI OF THE LEFT FOOT WITHOUT CONTRAST TECHNIQUE: Multiplanar, multisequence MR imaging of the left forefoot was performed. No intravenous contrast was administered. COMPARISON:  None. FINDINGS: Bones/Joint/Cartilage Soft tissue wound involving the plantar aspect of the great toe extending to the IP joint. Small first IP joint joint effusion with marrow edema on either side of the joint concerning for septic arthritis. No fracture or dislocation. Normal alignment. No other joint effusion. Advanced degenerative changes of midfoot which are partially visualized. Ligaments Collateral ligaments are intact.  Lisfranc ligament is intact. Muscles and Tendons Flexor, peroneal and extensor compartment tendons are intact. Diffuse muscle atrophy throughout the left foot. Soft tissue No fluid collection or hematoma.  No soft tissue mass. IMPRESSION: 1. Soft tissue wound involving the plantar aspect of the great toe extending to the IP joint. Small first IP joint joint effusion with marrow edema on either side of  the joint concerning for septic arthritis and osteomyelitis. Electronically Signed   By: Kathreen Devoid   On: 12/07/2017 19:02   Dg Foot Complete Left  Result Date: 12/07/2017 CLINICAL DATA:  Toe infection. EXAM: LEFT FOOT - COMPLETE 3+ VIEW COMPARISON:  None. FINDINGS: Soft tissue deformity noted of the distal great toe. I do not see plain radiographic evidence of osteomyelitis. There is some chronic post traumatic deformity of the inter phalangeal joint of the great toe. Degenerative changes are noted in the midfoot, possibly neuropathic or post traumatic. IMPRESSION: Soft tissue abnormality of the great toe without plain radiographic evidence of underlying osteomyelitis. Extensive degenerative changes of the midfoot, possibly neuropathic or post traumatic. Electronically Signed   By: Nelson Chimes M.D.   On: 12/07/2017 12:50      Scheduled Meds: . calcium acetate  2,668 mg Oral TID WC  . Chlorhexidine Gluconate Cloth  6 each Topical Q0600  . clopidogrel  75 mg Oral Daily  . [START ON 12/10/2017] doxercalciferol  6 mcg Intravenous Q M,W,F-HD  . famotidine  20 mg Oral QHS  . heparin  5,000 Units Subcutaneous Q12H  . levothyroxine  125 mcg Oral QAC breakfast  . midodrine  5 mg Oral TID WC  . promethazine  25 mg Oral BID  . sodium chloride flush  3 mL Intravenous Q12H  . sodium polystyrene  15 g Oral Once per day on Sun Sat  . traZODone  100 mg Oral QHS  . zolpidem  10 mg Oral QHS   Continuous Infusions: . sodium chloride 250 mL (12/08/17 1139)  .  ceFAZolin (ANCEF) IV 1 g (12/09/17 0028)     LOS: 2 days    Time spent: 35 minutes   Dessa Phi, DO [facility] [email or web address] Password [facility] 12/09/2017, 12:37 PM

## 2017-12-09 NOTE — H&P (View-Only) (Signed)
ORTHOPAEDIC CONSULTATION  REQUESTING PHYSICIAN: Dessa Phi, DO  Chief Complaint: Ulceration cellulitis abscess left great toe  HPI: [name] is a 52 y.o. 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  He states he does not have much access options left.  Patient states he has had a chronic callus that he has pared on his own.  Denies any open ulcers.  Past Medical History:  Diagnosis Date  . Anemia   . Anxiety   . Constipation   . End stage renal failure on dialysis [facility])    "[place]; MWF" (02/29/2016)  . GERD (gastroesophageal reflux disease)   . Grand mal seizure ([place]) X 4   last 1998 (02/29/2016)  . Heart murmur    no problems with per pt. - nephrologist and Dr. Coletta Memos  . History of blood transfusion 2000s   "I was having blood loss; never found out from where"  . History of kidney stones   . Hypertension    hx of - has not taken bp meds in over 2 years  . Hypothyroidism   . Insomnia   . Migraine    "due to my BP in my late 1990s; none since" (02/29/2016)  . Nonhealing surgical wound    of the left arteriovenous graft  . Pneumonia    x 2  . Shortness of breath    rarely  . Spina bifida [facility])    Past Surgical History:  Procedure Laterality Date  . APPENDECTOMY    . ARTERIOVENOUS GRAFT PLACEMENT Left 10/16/2012   left femoral goretex graft         Dr Donnetta Hutching  . AV FISTULA PLACEMENT Left 06/27/2012   Procedure: EXPLORATORY LEFT THY-GRAFT PSEUDO-ANEURYSM;  Surgeon: Conrad Benjamin Perez, MD;  Location: [place];  Service: Vascular;  Laterality: Left;  Revision of left Arteriovenus gortex graft in thigh.  . COLONOSCOPY    . I&D EXTREMITY Left 02/29/2016   Procedure: DEBRIDEMENT LEFT THIGH WOUND;  Surgeon: Waynetta Sandy, MD;  Location: [place];  Service: Vascular;  Laterality: Left;  . ILEOSTOMY  1970s  . INCISION AND DRAINAGE  Left 10/16/2012   Procedure: INCISION AND Debridement left thigh graft;  Surgeon: Rosetta Posner, MD;  Location: [place];  Service: Vascular;  Laterality: Left;  . INSERTION OF DIALYSIS CATHETER    . INSERTION OF DIALYSIS CATHETER  01/14/2016   Procedure: INSERTION OF DIALYSIS CATHETER;  Surgeon: Waynetta Sandy, MD;  Location: [place];  Service: Vascular;;  . INSERTION OF DIALYSIS CATHETER Right 08/07/2017   Procedure: INSERTION OF DIALYSIS CATHETER;  Surgeon: Waynetta Sandy, MD;  Location: [place];  Service: Vascular;  Laterality: Right;  . INSERTION OF ILIAC STENT Left 01/14/2016   Procedure: INSERTION OF ILIAC STENT;  Surgeon: Waynetta Sandy, MD;  Location: [place];  Service: Vascular;  Laterality: Left;  . INTRAOPERATIVE ARTERIOGRAM Left 01/14/2016   Procedure: INTRA OPERATIVE ARTERIOGRAM;  Surgeon: Waynetta Sandy, MD;  Location: [place];  Service: Vascular;  Laterality: Left;  . LITHOTRIPSY  x 3   "& a laser treatment" (02/29/2016)  . NEPHROSTOMY Bilateral 1968  . PATELLAR TENDON REPAIR Right 1990s   "big incision"  . PERIPHERAL VASCULAR CATHETERIZATION  01/14/2016   Procedure: A/V SHUNTOGRAM;  Surgeon: Waynetta Sandy, MD;  Location: [place];  Service: Vascular;;  . REVISION OF  ARTERIOVENOUS GORETEX GRAFT Left 10/16/2012   Procedure: REVISION OF LEFT FEMORAL LOOP ARTERIOVENOUS GORETEX GRAFT;  Surgeon: Rosetta Posner, MD;  Location: [place];  Service: Vascular;  Laterality: Left;  . REVISION OF ARTERIOVENOUS GORETEX GRAFT Left 02/11/2015   Procedure: EXCISION OF SMALL SEGMENT OF EXPOSED LEFT THIGH NON FUNCTIONING  ARTERIOVENOUS GORETEX GRAFT;  Surgeon: Mal Misty, MD;  Location: [place];  Service: Vascular;  Laterality: Left;  . REVISION OF ARTERIOVENOUS GORETEX GRAFT Left 01/14/2016   Procedure: REVISION OF ARTERIOVENOUS GORETEX GRAFT;  Surgeon: Waynetta Sandy, MD;  Location: [place];  Service: Vascular;  Laterality: Left;  . REVISION OF ARTERIOVENOUS  GORETEX GRAFT Left 02/29/2016   thigh/pt report  . REVISION OF ARTERIOVENOUS GORETEX GRAFT Left 02/29/2016   Procedure: POSSIBLE REVISION OF LEFT THIGH ARTERIOVENOUS GORETEX GRAFT;  Surgeon: Waynetta Sandy, MD;  Location: [place];  Service: Vascular;  Laterality: Left;  . THROMBECTOMY W/ EMBOLECTOMY Left 01/14/2016   Procedure: THROMBECTOMY ARTERIOVENOUS GORE-TEX Left thigh GRAFT;  Surgeon: Waynetta Sandy, MD;  Location: [place];  Service: Vascular;  Laterality: Left;  . TONGUE SURGERY  ~ 1990   tongue-tie release   . ULTRASOUND GUIDANCE FOR VASCULAR ACCESS Right 08/07/2017   Procedure: ULTRASOUND GUIDANCE FOR VASCULAR CANNULATION RIGHT FEMORAL VEIN AND LEFT AV FEMORAL GRAFT;  Surgeon: Waynetta Sandy, MD;  Location: [place];  Service: Vascular;  Laterality: Right;  . UPPER EXTREMITY VENOGRAPHY Bilateral 08/09/2017   Procedure: UPPER EXTREMITY VENOGRAPHY;  Surgeon: Serafina Mitchell, MD;  Location: [place] [facility];  Service: Cardiovascular;  Laterality: Bilateral;  . VENOGRAM N/A 09/20/2017   Procedure: RIGHT COMMON FEMORAL ARTERY EXPLORATION.  CANNULATION RIGHT COMMON FEMORAL VEIN. VENOGRAM CENTRAL ULTRA SOUND GUIDED RIGHT FEMORAL VEIN TIMES TWO.;  Surgeon: Waynetta Sandy, MD;  Location: [place];  Service: Vascular;  Laterality: N/A;  . WOUND DEBRIDEMENT Left 02/29/2016   thigh   Social History   Socioeconomic History  . Marital status: Single    Spouse name: Not on file  . Number of children: 0  . Years of education: Not on file  . Highest education level: Not on file  Occupational History  . Occupation: Disabled  Social Needs  . Financial resource strain: Not on file  . Food insecurity:    Worry: Not on file    Inability: Not on file  . Transportation needs:    Medical: Not on file    Non-medical: Not on file  Tobacco Use  . Smoking status: Never Smoker  . Smokeless tobacco: Never Used  Substance and Sexual Activity  . Alcohol use: Not Currently      Alcohol/week: 0.0 standard drinks    Comment: 02/29/2016 "nothing since  the mid 1990s  . Drug use: Not Currently    Types: Marijuana    Comment: 02/29/2016 "nothing since ~ 1995"  . Sexual activity: Never  Lifestyle  . Physical activity:    Days per week: Not on file    Minutes per session: Not on file  . Stress: Not on file  Relationships  . Social connections:    Talks on phone: Not on file    Gets together: Not on file    Attends religious service: Not on file    Active member of club or organization: Not on file    Attends meetings of clubs or organizations: Not on file    Relationship status: Not on file  Other Topics Concern  . Not on file  Social  History Narrative  . Not on file   Family History  Problem Relation Age of Onset  . Diabetes Mother   . Hypertension Mother   . Heart disease Mother        before age 19  . Diabetes Father   . Heart attack Father        X's 3  . Diabetes Sister   . Bipolar disorder Sister    - negative except otherwise stated in the family history section Allergies  Allergen Reactions  . Hydrocodone Other (See Comments)    LIKELY NOT REACTION TO HYDROCODONE Pt states that after 3 weeks of taking this medication he will began to "twitch" Pt states that after 3 weeks of taking this medication he will began to "twitch" LIKELY NOT REACTION TO HYDROCODONE Pt states that after 3 weeks of taking this medication he will began to "twitch" LIKELY NOT REACTION TO HYDROCODONE Pt states that after 3 weeks of taking this medication he will began to "twitch" Pt states that after 3 weeks of taking this medication he will began to "twitch"  . Furadantin [Nitrofurantoin] Other (See Comments)    UNSPECIFIED REACTION   . Mandelamine [Methenamine] Other (See Comments)    UNSPECIFIED REACTION   . Noroxin [Norfloxacin] Other (See Comments)    UNSPECIFIED REACTION   . [place]ine Nausea Only  . Contrast Media [Iodinated Diagnostic Agents] Nausea And  Vomiting    Oral dye causes vomiting, IV dye is okay  . Metrizamide Nausea And Vomiting    Oral dye causes vomiting, IV dye is okay  . Sulfa Antibiotics Cough    Childhood reaction - pt could not confirm that it was a cough  . Sulfur Cough    Childhood reaction - pt could not confirm that it was a cough   Prior to Admission medications   Medication Sig Start Date End Date Taking? Authorizing Provider  aspirin 325 MG tablet Take 325 mg by mouth daily at 2 PM.    Yes [provider]  calcium acetate (PHOSLO) 667 MG capsule Take 2,001-2,708 mg by mouth See admin instructions. Take 2708 mg by mouth 3 times daily with meals, take 2001 mg by mouth with snacks   Yes [provider]  clopidogrel (PLAVIX) 75 MG tablet Take 1 tablet (75 mg total) by mouth daily. 01/17/16  Yes Alvia Grove, PA-C  famotidine (PEPCID) 40 MG tablet Take 40 mg by mouth daily at 2 PM.    Yes [provider]  fluticasone (FLONASE) 50 MCG/ACT nasal spray Place 2 sprays into both nostrils as needed for allergies or rhinitis (congestion).    Yes [provider]  furosemide (LASIX) 80 MG tablet Take 80 mg by mouth daily as needed (for swelling or shortness of breath).    Yes [provider]  levothyroxine (SYNTHROID, LEVOTHROID) 125 MCG tablet Take 125 mcg by mouth daily before breakfast.    Yes [provider]  LORazepam (ATIVAN) 0.5 MG tablet Take 0.5 mg by mouth daily as needed for anxiety.    Yes [provider]  oxyCODONE-acetaminophen (PERCOCET) 10-325 MG tablet Take 1 tablet by mouth 4 (four) times daily as needed for pain. Patient taking differently: Take 1 tablet by mouth every 6 (six) hours.  03/01/16  Yes Alvia Grove, PA-C  promethazine (PHENERGAN) 25 MG tablet Take 25 mg by mouth 2 (two) times daily.    Yes [provider]  sodium polystyrene (KAYEXALATE) powder Take 15 g by mouth See  admin instructions. Take 15 g (mixed in water) by mouth  on Saturdays and Sundays   Yes [provider]  traZODone (DESYREL) 100 MG tablet Take 100 mg by mouth at bedtime.   Yes [provider]  zolpidem (AMBIEN) 10 MG tablet Take 10 mg by mouth at bedtime.    Yes [provider]  midodrine (PROAMATINE) 5 MG tablet Take 1 tablet (5 mg total) by mouth 3 (three) times daily with meals. Patient not taking: Reported on 12/07/2017 01/18/16   Alvia Grove, PA-C   Ct Abdomen Pelvis Wo Contrast  Result Date: 12/09/2017 CLINICAL DATA:  BacteremiaBacteremia Right flank pain EXAM: CT ABDOMEN AND PELVIS WITHOUT CONTRAST TECHNIQUE: Multidetector CT imaging of the abdomen and pelvis was performed following the standard protocol without IV contrast. COMPARISON:  CT abdomen 5 21 2010 FINDINGS: Lower chest: Lung bases are clear. Hepatobiliary: No focal hepatic lesion on noncontrast exam. Pancreas: Pancreas is normal. No ductal dilatation. No pancreatic inflammation. Spleen: Normal spleen Adrenals/urinary tract: Adrenal glands normal. Kidneys are atrophic. Multiple renal calculi. No hydronephrosis. Bladder is decompressed Stomach/Bowel: Stomach, small-bowel cecum normal. There is a ileostomy in the RIGHT lower quadrant. Small parastomal hernia. No bowel obstruction. Colon rectosigmoid colon normal. Vascular/Lymphatic: Abdominal aorta is normal caliber. Grafts within the LEFT common iliac vein. There is central venous line in the RIGHT common iliac vein extending the IVC. Venous collaterals along the abdominal wall. Reproductive: Prostate normal Other: No free fluid or free air Musculoskeletal: Diffuse increase in bone sclerosis suggesting renal osteodystrophy IMPRESSION: 1. No clear acute findings in the abdomen pelvis. 2. Atrophy kidneys. 3. RIGHT lower quadrant ileostomy without bowel obstruction. 4. 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 No evidence of acute infection or inflammation. 6. Large-bore central venous line from a  RIGHT groin approach Electronically Signed   By: Suzy Bouchard M.D.   On: 12/09/2017 00:46   Dg Chest 2 View  Result Date: 12/07/2017 CLINICAL DATA:  52 year old male with a history of fever EXAM: CHEST - 2 VIEW COMPARISON:  08/07/2017 FINDINGS: Cardiomediastinal silhouette unchanged in size and contour. Low lung volumes with coarsened interstitial markings. No pleural effusion. No confluent airspace disease. No pneumothorax. IMPRESSION: Low lung volumes, with either chronic changes or early edema. Electronically Signed   By: Corrie Mckusick D.O.   On: 12/07/2017 11:44   Mr Foot Left Wo Contrast  Result Date: 12/07/2017 CLINICAL DATA:  Soft tissue wound of the great toe. Evaluate for osteomyelitis. EXAM: MRI OF THE LEFT FOOT WITHOUT CONTRAST TECHNIQUE: Multiplanar, multisequence MR imaging of the left forefoot was performed. No intravenous contrast was administered. COMPARISON:  None. FINDINGS: Bones/Joint/Cartilage Soft tissue wound involving the plantar aspect of the great toe extending to the IP joint. Small first IP joint joint effusion with marrow edema on either side of the joint concerning for septic arthritis. No fracture or dislocation. Normal alignment. No other joint effusion. Advanced degenerative changes of midfoot which are partially visualized. Ligaments Collateral ligaments are intact.  Lisfranc ligament is intact. Muscles and Tendons Flexor, peroneal and extensor compartment tendons are intact. Diffuse muscle atrophy throughout the left foot. Soft tissue No fluid collection or hematoma.  No soft tissue mass. IMPRESSION: 1. Soft tissue wound involving the plantar aspect of the great toe extending to the IP joint. Small first IP joint joint effusion with marrow edema on either side of the joint concerning for septic arthritis and osteomyelitis. Electronically Signed   By: Kathreen Devoid   On: 12/07/2017  19:02   Dg Foot Complete Left  Result Date: 12/07/2017 CLINICAL DATA:  Toe infection.  EXAM: LEFT FOOT - COMPLETE 3+ VIEW COMPARISON:  None. FINDINGS: Soft tissue deformity noted of the distal great toe. I do not see plain radiographic evidence of osteomyelitis. There is some chronic post traumatic deformity of the inter phalangeal joint of the great toe. Degenerative changes are noted in the midfoot, possibly neuropathic or post traumatic. IMPRESSION: Soft tissue abnormality of the great toe without plain radiographic evidence of underlying osteomyelitis. Extensive degenerative changes of the midfoot, possibly neuropathic or post traumatic. Electronically Signed   By: Nelson Chimes M.D.   On: 12/07/2017 12:50   - pertinent xrays, CT, MRI studies were reviewed and independently interpreted  Positive ROS: All other systems have been reviewed and were otherwise negative with the exception of those mentioned in the HPI and as above.  Physical Exam: General: Alert, no acute distress Psychiatric: Patient is competent for consent with normal mood and affect Lymphatic: No axillary or cervical lymphadenopathy Cardiovascular: No pedal edema Respiratory: No cyanosis, no use of accessory musculature GI: No organomegaly, abdomen is soft and non-tender    Images:  @ENCIMAGES @  Labs:  Lab Results  Component Value Date   REPTSTATUS PENDING 12/07/2017   GRAMSTAIN  10/16/2012    RARE WBC PRESENT, PREDOMINANTLY PMN NO SQUAMOUS EPITHELIAL CELLS SEEN NO ORGANISMS SEEN Performed at [place]  10/16/2012    RARE WBC PRESENT, PREDOMINANTLY PMN NO SQUAMOUS EPITHELIAL CELLS SEEN NO ORGANISMS SEEN Performed at [place] (A) 12/07/2017    STAPHYLOCOCCUS AUREUS SUSCEPTIBILITIES TO FOLLOW Performed at [facility], [address], [place], [place] [address]    LABORGA METHICILLIN RESISTANT STAPHYLOCOCCUS AUREUS 04/26/2008    Lab Results  Component Value Date   ALBUMIN 3.0 (L) 12/07/2017   ALBUMIN 3.0 (L) 08/10/2017   ALBUMIN 3.2 (L)  08/08/2017    Neurologic: Patient does not have protective sensation bilateral lower extremities.   MUSCULOSKELETAL:   Skin: Examination patient has swelling abscess cellulitis of the left great toe.  Patient is status post clubfoot surgery he does have good capillary refill but does not have a palpable dorsalis pedis or posterior tibial pulse there is no ischemic changes to his foot.  Review the MRI scan shows osteomyelitis and abscess of the IP joint of the left great toe with osteomyelitis of the proximal phalanx and tuft.  There is no ascending cellulitis he does have venous insufficiency with brawny skin color changes.  Assessment: Assessment insensate neuropathy with abscess ulceration osteomyelitis left great toe.  Plan: Plan: We will plan for a left foot great toe amputation.  Patient states he understands wished to proceed at this time he states he does have a leg length inequality with a shorter left leg.  We will evaluate his multiple orthopedic issues as an outpatient.  Plan for surgery early this week for the amputation left great toe.  Thank you for the consult and the opportunity to see [name], [place] [phone] 9:45 AM

## 2017-12-09 NOTE — Progress Notes (Signed)
Patient having anxiety and requests anti-anxiety medication. Patient informed that he has orders for lorazepam 0.5 mg PO. Patient stated that this would not work for him and that he is requesting IV form for one time dose. MD notified. Orders placed and followed. Will continue to monitor.

## 2017-12-10 ENCOUNTER — Inpatient Hospital Stay (HOSPITAL_COMMUNITY): Payer: Medicare Other

## 2017-12-10 ENCOUNTER — Ambulatory Visit (INDEPENDENT_AMBULATORY_CARE_PROVIDER_SITE_OTHER): Payer: Self-pay | Admitting: Physician Assistant

## 2017-12-10 DIAGNOSIS — Z885 Allergy status to narcotic agent status: Secondary | ICD-10-CM

## 2017-12-10 DIAGNOSIS — R7881 Bacteremia: Secondary | ICD-10-CM

## 2017-12-10 DIAGNOSIS — I350 Nonrheumatic aortic (valve) stenosis: Secondary | ICD-10-CM

## 2017-12-10 DIAGNOSIS — L02612 Cutaneous abscess of left foot: Secondary | ICD-10-CM

## 2017-12-10 DIAGNOSIS — Z992 Dependence on renal dialysis: Secondary | ICD-10-CM

## 2017-12-10 DIAGNOSIS — Z888 Allergy status to other drugs, medicaments and biological substances status: Secondary | ICD-10-CM

## 2017-12-10 DIAGNOSIS — R5381 Other malaise: Secondary | ICD-10-CM

## 2017-12-10 DIAGNOSIS — R5383 Other fatigue: Secondary | ICD-10-CM

## 2017-12-10 DIAGNOSIS — M869 Osteomyelitis, unspecified: Secondary | ICD-10-CM

## 2017-12-10 DIAGNOSIS — Z881 Allergy status to other antibiotic agents status: Secondary | ICD-10-CM

## 2017-12-10 DIAGNOSIS — N186 End stage renal disease: Secondary | ICD-10-CM

## 2017-12-10 DIAGNOSIS — B9561 Methicillin susceptible Staphylococcus aureus infection as the cause of diseases classified elsewhere: Secondary | ICD-10-CM

## 2017-12-10 DIAGNOSIS — Z91041 Radiographic dye allergy status: Secondary | ICD-10-CM

## 2017-12-10 LAB — CULTURE, BLOOD (ROUTINE X 2)
Special Requests: ADEQUATE
Special Requests: ADEQUATE

## 2017-12-10 LAB — ECHOCARDIOGRAM COMPLETE
Height: 71 in
Weight: 4303.73 oz

## 2017-12-10 LAB — CBC
HCT: 32.5 % — ABNORMAL LOW (ref 39.0–52.0)
Hemoglobin: 9.7 g/dL — ABNORMAL LOW (ref 13.0–17.0)
MCH: 28.4 pg (ref 26.0–34.0)
MCHC: 29.8 g/dL — ABNORMAL LOW (ref 30.0–36.0)
MCV: 95.3 fL (ref 80.0–100.0)
Platelets: 128 10*3/uL — ABNORMAL LOW (ref 150–400)
RBC: 3.41 MIL/uL — ABNORMAL LOW (ref 4.22–5.81)
RDW: 16.4 % — ABNORMAL HIGH (ref 11.5–15.5)
WBC: 4.9 10*3/uL (ref 4.0–10.5)
nRBC: 0 % (ref 0.0–0.2)

## 2017-12-10 LAB — BASIC METABOLIC PANEL
Anion gap: 13 (ref 5–15)
BUN: 67 mg/dL — ABNORMAL HIGH (ref 6–20)
CO2: 27 mmol/L (ref 22–32)
Calcium: 8.7 mg/dL — ABNORMAL LOW (ref 8.9–10.3)
Chloride: 95 mmol/L — ABNORMAL LOW (ref 98–111)
Creatinine, Ser: 11.18 mg/dL — ABNORMAL HIGH (ref 0.61–1.24)
GFR calc Af Amer: 5 mL/min — ABNORMAL LOW (ref >60)
GFR calc non Af Amer: 5 mL/min — ABNORMAL LOW (ref >60)
Glucose, Bld: 90 mg/dL (ref 70–99)
Potassium: 5 mmol/L (ref 3.5–5.1)
Sodium: 135 mmol/L (ref 135–145)

## 2017-12-10 MED ORDER — CHLORHEXIDINE GLUCONATE CLOTH 2 % EX PADS: 6.0000 | MEDICATED_PAD | Freq: Every day | CUTANEOUS | Status: DC

## 2017-12-10 MED ORDER — PROMETHAZINE HCL 25 MG/ML IJ SOLN
25.0000 mg | Freq: Four times a day (QID) | INTRAMUSCULAR | Status: DC | PRN
  Administered 2017-12-10 – 2017-12-17 (×6): 25 mg via INTRAVENOUS
  Filled 2017-12-10 (×4): qty 1

## 2017-12-10 MED ORDER — DARBEPOETIN ALFA 25 MCG/0.42ML IJ SOSY
25.0000 ug | PREFILLED_SYRINGE | INTRAMUSCULAR | Status: DC
  Filled 2017-12-10: qty 0.42

## 2017-12-10 MED ORDER — DEXTROSE 5 % IV SOLN
3.0000 g | INTRAVENOUS | Status: AC
  Administered 2017-12-11: 3 g via INTRAVENOUS
  Filled 2017-12-10: qty 3

## 2017-12-10 MED ORDER — CHLORHEXIDINE GLUCONATE 4 % EX LIQD
60.0000 mL | Freq: Once | CUTANEOUS | Status: DC
  Filled 2017-12-10: qty 60

## 2017-12-10 NOTE — Anesthesia Preprocedure Evaluation (Addendum)
Anesthesia Evaluation  Patient identified by MRN, date of birth, ID band Patient awake    Reviewed: Allergy & Precautions, NPO status , Patient's Chart, lab work & pertinent test results  History of Anesthesia Complications Negative for: history of anesthetic complications  Airway Mallampati: IV  TM Distance: >3 FB Neck ROM: Full  Mouth opening: Limited Mouth Opening  Dental  (+) Teeth Intact   Pulmonary neg pulmonary ROS,    Pulmonary exam normal        Cardiovascular hypertension, + Peripheral Vascular Disease  Normal cardiovascular exam+ Valvular Problems/Murmurs AS      Neuro/Psych Seizures -,  PSYCHIATRIC DISORDERS Anxiety    GI/Hepatic Neg liver ROS, GERD  ,  Endo/Other  Hypothyroidism   Renal/GU ESRF and DialysisRenal disease  negative genitourinary   Musculoskeletal negative musculoskeletal ROS (+)   Abdominal   Peds  Hematology  (+) anemia ,   Anesthesia Other Findings 52 yo M for L great toe amp - anxiety; seizures (remote); chronic hypotension (on midodrine); aortic stenosis; PAD; GERD; ESRD on HD; hypothyroid; anemia/thrombocytopenia - TTE 12/10/17: EF 60-65%, moderate AS  Reproductive/Obstetrics                            Anesthesia Physical Anesthesia Plan  ASA: IV  Anesthesia Plan: General   Post-op Pain Management:    Induction: Intravenous  PONV Risk Score and Plan: 2 and Treatment may vary due to age or medical condition, Ondansetron, Dexamethasone and Midazolam  Airway Management Planned: LMA  Additional Equipment: None  Intra-op Plan:   Post-operative Plan: Extubation in OR  Informed Consent: I have reviewed the patients History and Physical, chart, labs and discussed the procedure including the risks, benefits and alternatives for the proposed anesthesia with the patient or authorized representative who has indicated his/her understanding and acceptance.      Plan Discussed with:   Anesthesia Plan Comments:        Anesthesia Quick Evaluation

## 2017-12-10 NOTE — Care Management Important Message (Signed)
Important Message  Patient Details  Name: [name] MRN: [id number] Date of Birth: [birth year]   Medicare Important Message Given:  Yes    Starkisha Tullis 12/10/2017, 4:30 PM

## 2017-12-10 NOTE — Progress Notes (Signed)
PROGRESS NOTE    [name]  OEU:[id number] DOB: 06-09-1965 DOA: 12/07/2017 PCP: [name], MD     Brief Narrative:  [name] is a 52 year old male with past medical history significant for end-stage renal disease on hemodialysis, peripheral artery disease, hypothyroidism, hypotension on midodrine, who presents with fevers and chills.  He admits to 1 week history of not feeling well, fever of 102 at home.  He has also been having left great toe pain and redness that has been getting worse in the past 2 weeks.  He was in dialysis when he was found to be febrile at one 1.5, he was started on vancomycin, Fortaz.  Also complaining of right flank pain at the site of prior nephrostomy site, reports history of bleeding from the site as well.  In the emergency department, he was found to be afebrile, WBC 8.1.  He was admitted for treatment of what was thought to be left toe cellulitis.  MRI revealed osteomyelitis of the left great toe.  New events last 24 hours / Subjective: Has some nausea this morning without vomiting.  Anxiety better with IV Ativan.  Continues to have pain in his left toe.  Assessment & Plan:   Principal Problem:   MSSA bacteremia Active Problems:   End stage renal disease (HCC)   Fever   Right flank pain   Sepsis secondary to left toe osteomyelitis, MSSA bacteremia  -MRI revealed: Soft tissue wound involving the plantar aspect of the great toe extending to the IP joint. Small first IP joint joint effusion with marrow edema on either side of the joint concerning for septic arthritis and osteomyelitis. -Orthopedic surgery planning for left first great toe amputation this week -Infectious disease following -Echocardiogram pending -Vanco/ceftazidime --> Cefazolin   Right flank pain tenderness, status post right nephrostomy tube in the past -CT abdomen pelvis without acute findings -Right flank pain improving  ESRD on hemodialysis Per  nephrology  Hypothyroidism Continue Synthroid  Chronic hypotension Continue Midodrine   PAD Continue plavix   Anxiety Ativan   DVT prophylaxis: Subq hep Code Status: Full Family Communication: No family at bedside Disposition Plan: Pending surgery this week   Consultants:   Nephrology  ID  Orthopedic surgery   Procedures:   None   Antimicrobials:  Anti-infectives (From admission, onward)   Start     Dose/Rate Route Frequency Ordered Stop   12/10/17 2000  cefTAZidime (FORTAZ) 2 g in sodium chloride 0.9 % 100 mL IVPB  Status:  Discontinued     2 g 200 mL/hr over 30 Minutes Intravenous Every M-W-F (2000) 12/07/17 2218 12/08/17 1029   12/10/17 1200  vancomycin (VANCOCIN) IVPB 1000 mg/200 mL premix  Status:  Discontinued     1,000 mg 200 mL/hr over 60 Minutes Intravenous Every M-W-F (Hemodialysis) 12/07/17 2218 12/08/17 1029   12/08/17 2200  ceFAZolin (ANCEF) IVPB 1 g/50 mL premix     1 g 100 mL/hr over 30 Minutes Intravenous Every 24 hours 12/08/17 1250     12/08/17 2000  cefTAZidime (FORTAZ) 2 g in sodium chloride 0.9 % 100 mL IVPB  Status:  Discontinued     2 g 200 mL/hr over 30 Minutes Intravenous  Once 12/07/17 2218 12/08/17 1157   12/08/17 1200  vancomycin (VANCOCIN) IVPB 1000 mg/200 mL premix  Status:  Discontinued     1,000 mg 200 mL/hr over 60 Minutes Intravenous Every T-Th-Sa (Hemodialysis) 12/07/17 2218 12/08/17 1157   12/08/17 1100  ceFAZolin (ANCEF) IVPB 1 g/50 mL premix  Status:  Discontinued     1 g 100 mL/hr over 30 Minutes Intravenous Every 24 hours 12/08/17 1029 12/08/17 1250   12/08/17 0000  vancomycin (VANCOCIN) IVPB 1000 mg/200 mL premix  Status:  Discontinued     1,000 mg 200 mL/hr over 60 Minutes Intravenous Every M-W-F (Hemodialysis) 12/07/17 2216 12/07/17 2218   12/08/17 0000  cefTAZidime (FORTAZ) 2 g in sodium chloride 0.9 % 100 mL IVPB  Status:  Discontinued     2 g 200 mL/hr over 30 Minutes Intravenous Every M-W-F (2000) 12/07/17 2216  12/07/17 2218   12/07/17 2215  cefTAZidime (FORTAZ) 2 g in sodium chloride 0.9 % 100 mL IVPB  Status:  Discontinued     2 g 200 mL/hr over 30 Minutes Intravenous Every M-W-F (2000) 12/07/17 2209 12/07/17 2216       Objective: Vitals:   12/09/17 1719 12/09/17 2102 12/10/17 0436 12/10/17 0850  BP: (!) 150/81 123/62 106/62 (!) 93/43  Pulse: 73 74 67 62  Resp: 20 18 18 18   Temp: 98.6 F (37 C) 98.6 F (37 C) 98.4 F (36.9 C) 98.3 F (36.8 C)  TempSrc: Oral Oral  Oral  SpO2: 100% 99% 96% 96%  Weight:  122 kg    Height:        Intake/Output Summary (Last 24 hours) at 12/10/2017 1235 Last data filed at 12/10/2017 0921 Gross per 24 hour  Intake 710 ml  Output 0 ml  Net 710 ml   Filed Weights   12/08/17 1300 12/08/17 1804 12/09/17 2102  Weight: 122.7 kg 122 kg 122 kg    Examination: General exam: Appears calm and comfortable  Respiratory system: Clear to auscultation. Respiratory effort normal. Cardiovascular system: S1 & S2 heard, RRR. No JVD, murmurs, rubs, gallops or clicks. No pedal edema. Gastrointestinal system: Abdomen is nondistended, soft and nontender. No organomegaly or masses felt. Normal bowel sounds heard. Central nervous system: Alert and oriented. No focal neurological deficits. Extremities: Symmetric  Psychiatry: Judgement and insight appear normal. Mood & affect appropriate.    Data Reviewed: I have personally reviewed following labs and imaging studies  CBC: Recent Labs  Lab 12/07/17 1115 12/08/17 0636 12/09/17 0541 12/10/17 0431  WBC 8.1 5.2 4.7 4.9  NEUTROABS 7.4  --   --   --   HGB 10.9* 10.0* 10.3* 9.7*  HCT 36.0* 32.4* 34.9* 32.5*  MCV 96.3 95.0 95.9 95.3  PLT 92* 91* 106* 381*   Basic Metabolic Panel: Recent Labs  Lab 12/07/17 1115 12/08/17 0636 12/09/17 0541 12/09/17 1241 12/10/17 0431  NA 134* 136 136  --  135  K 4.3 4.8 4.5  --  5.0  CL 92* 93* 95*  --  95*  CO2 24 27 27   --  27  GLUCOSE 101* 100* 111*  --  90  BUN 39* 56*  44*  --  67*  CREATININE 9.00* 10.99* 8.78*  --  11.18*  CALCIUM 8.0* 8.0* 8.5*  --  8.7*  PHOS  --   --   --  4.2  --    GFR: Estimated Creatinine Clearance: 10.3 mL/min (A) (by C-G formula based on SCr of 11.18 mg/dL (H)). Liver Function Tests: Recent Labs  Lab 12/07/17 1115  AST 19  ALT 14  ALKPHOS 56  BILITOT 1.1  PROT 8.3*  ALBUMIN 3.0*   No results for input(s): LIPASE, AMYLASE in the last 168 hours. No results for input(s): AMMONIA in the last 168 hours. Coagulation Profile: Recent Labs  Lab  12/07/17 1115  INR 1.15   Cardiac Enzymes: Recent Labs  Lab 12/07/17 1243  TROPONINI 0.04*   BNP (last 3 results) No results for input(s): PROBNP in the last 8760 hours. HbA1C: No results for input(s): HGBA1C in the last 72 hours. CBG: No results for input(s): GLUCAP in the last 168 hours. Lipid Profile: No results for input(s): CHOL, HDL, LDLCALC, TRIG, CHOLHDL, LDLDIRECT in the last 72 hours. Thyroid Function Tests: No results for input(s): TSH, T4TOTAL, FREET4, T3FREE, THYROIDAB in the last 72 hours. Anemia Panel: No results for input(s): VITAMINB12, FOLATE, FERRITIN, TIBC, IRON, RETICCTPCT in the last 72 hours. Sepsis Labs: Recent Labs  Lab 12/07/17 1130  LATICACIDVEN 1.33    Recent Results (from the past 240 hour(s))  Culture, blood (Routine x 2)     Status: Abnormal   Collection Time: 12/07/17 11:15 AM  Result Value Ref Range Status   Specimen Description BLOOD RIGHT ANTECUBITAL  Final   Special Requests   Final    BOTTLES DRAWN AEROBIC AND ANAEROBIC Blood Culture adequate volume   Culture  Setup Time   Final    GRAM POSITIVE COCCI IN CLUSTERS IN BOTH AEROBIC AND ANAEROBIC BOTTLES CRITICAL RESULT CALLED TO, READ BACK BY AND VERIFIED WITH: [place], [place] [id number] FCP Performed at [facility], [place] [address] [place], [place] [address]    Culture STAPHYLOCOCCUS AUREUS (A)  Final   Report Status 12/10/2017 FINAL  Final   Organism ID, Bacteria  STAPHYLOCOCCUS AUREUS  Final      Susceptibility   Staphylococcus aureus - MIC*    CIPROFLOXACIN <=0.5 SENSITIVE Sensitive     ERYTHROMYCIN <=0.25 SENSITIVE Sensitive     GENTAMICIN <=0.5 SENSITIVE Sensitive     OXACILLIN 0.5 SENSITIVE Sensitive     TETRACYCLINE <=1 SENSITIVE Sensitive     VANCOMYCIN <=0.5 SENSITIVE Sensitive     TRIMETH/SULFA <=10 SENSITIVE Sensitive     CLINDAMYCIN <=0.25 SENSITIVE Sensitive     RIFAMPIN <=0.5 SENSITIVE Sensitive     Inducible Clindamycin NEGATIVE Sensitive     * STAPHYLOCOCCUS AUREUS  Blood Culture ID Panel (Reflexed)     Status: Abnormal   Collection Time: 12/07/17 11:15 AM  Result Value Ref Range Status   Enterococcus species NOT DETECTED NOT DETECTED Final   Listeria monocytogenes NOT DETECTED NOT DETECTED Final   Staphylococcus species DETECTED (A) NOT DETECTED Final    Comment: CRITICAL RESULT CALLED TO, READ BACK BY AND VERIFIED WITH: PHARMD ESPADA, [phone] FCP    Staphylococcus aureus (BCID) DETECTED (A) NOT DETECTED Final    Comment: Methicillin (oxacillin) susceptible Staphylococcus aureus (MSSA). Preferred therapy is anti staphylococcal beta lactam antibiotic (Cefazolin or Nafcillin), unless clinically contraindicated. CRITICAL RESULT CALLED TO, READ BACK BY AND VERIFIED WITH: PHARMD ESPADA, M [phone] [id number] FCP    Methicillin resistance NOT DETECTED NOT DETECTED Final   Streptococcus species NOT DETECTED NOT DETECTED Final   Streptococcus agalactiae NOT DETECTED NOT DETECTED Final   Streptococcus pneumoniae NOT DETECTED NOT DETECTED Final   Streptococcus pyogenes NOT DETECTED NOT DETECTED Final   Acinetobacter baumannii NOT DETECTED NOT DETECTED Final   Enterobacteriaceae species NOT DETECTED NOT DETECTED Final   Enterobacter cloacae complex NOT DETECTED NOT DETECTED Final   Escherichia coli NOT DETECTED NOT DETECTED Final   Klebsiella oxytoca NOT DETECTED NOT DETECTED Final   Klebsiella pneumoniae NOT DETECTED NOT DETECTED  Final   Proteus species NOT DETECTED NOT DETECTED Final   Serratia marcescens NOT DETECTED NOT DETECTED  Final   Haemophilus influenzae NOT DETECTED NOT DETECTED Final   Neisseria meningitidis NOT DETECTED NOT DETECTED Final   Pseudomonas aeruginosa NOT DETECTED NOT DETECTED Final   Candida albicans NOT DETECTED NOT DETECTED Final   Candida glabrata NOT DETECTED NOT DETECTED Final   Candida krusei NOT DETECTED NOT DETECTED Final   Candida parapsilosis NOT DETECTED NOT DETECTED Final   Candida tropicalis NOT DETECTED NOT DETECTED Final    Comment: Performed at [facility], [place] [address], [place], [place] [address]  Culture, blood (Routine x 2)     Status: Abnormal   Collection Time: 12/07/17 11:17 AM  Result Value Ref Range Status   Specimen Description BLOOD BLOOD LEFT ARM  Final   Special Requests   Final    BOTTLES DRAWN AEROBIC AND ANAEROBIC Blood Culture adequate volume   Culture  Setup Time   Final    GRAM POSITIVE COCCI IN CLUSTERS ANAEROBIC BOTTLE ONLY CRITICAL VALUE NOTED.  VALUE IS CONSISTENT WITH PREVIOUSLY REPORTED AND CALLED VALUE.    Culture (A)  Final    STAPHYLOCOCCUS AUREUS SUSCEPTIBILITIES PERFORMED ON PREVIOUS CULTURE WITHIN THE LAST 5 DAYS. Performed at [facility], [place] [address] [place], [place] [address]    Report Status 12/10/2017 FINAL  Final  MRSA PCR Screening     Status: None   Collection Time: 12/08/17 12:05 AM  Result Value Ref Range Status   MRSA by PCR NEGATIVE NEGATIVE Final    Comment:        The GeneXpert MRSA Assay (FDA approved for NASAL specimens only), is one component of a comprehensive MRSA colonization surveillance program. It is not intended to diagnose MRSA infection nor to guide or monitor treatment for MRSA infections. Performed at [facility], [place] [address], [place], [place] [address]        Radiology Studies: Ct Abdomen Pelvis Wo Contrast  Result Date: 12/09/2017 CLINICAL DATA:   BacteremiaBacteremia Right flank pain EXAM: CT ABDOMEN AND PELVIS WITHOUT CONTRAST TECHNIQUE: Multidetector CT imaging of the abdomen and pelvis was performed following the standard protocol without IV contrast. COMPARISON:  CT abdomen 5 21 2010 FINDINGS: Lower chest: Lung bases are clear. Hepatobiliary: No focal hepatic lesion on noncontrast exam. Pancreas: Pancreas is normal. No ductal dilatation. No pancreatic inflammation. Spleen: Normal spleen Adrenals/urinary tract: Adrenal glands normal. Kidneys are atrophic. Multiple renal calculi. No hydronephrosis. Bladder is decompressed Stomach/Bowel: Stomach, small-bowel cecum normal. There is a ileostomy in the RIGHT lower quadrant. Small parastomal hernia. No bowel obstruction. Colon rectosigmoid colon normal. Vascular/Lymphatic: Abdominal aorta is normal caliber. Grafts within the LEFT common iliac vein. There is central venous line in the RIGHT common iliac vein extending the IVC. Venous collaterals along the abdominal wall. Reproductive: Prostate normal Other: No free fluid or free air Musculoskeletal: Diffuse increase in bone sclerosis suggesting renal osteodystrophy IMPRESSION: 1. No clear acute findings in the abdomen pelvis. 2. Atrophy kidneys. 3. RIGHT lower quadrant ileostomy without bowel obstruction. 4. 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 No evidence of acute infection or inflammation. 6. Large-bore central venous line from a RIGHT groin approach Electronically Signed   By: Suzy Bouchard M.D.   On: 12/09/2017 00:46      Scheduled Meds: . calcium acetate  2,668 mg Oral TID WC  . Chlorhexidine Gluconate Cloth  6 each Topical Q0600  . clopidogrel  75 mg Oral Daily  . darbepoetin (ARANESP) injection - DIALYSIS  25 mcg Intravenous Q Mon-HD  .  doxercalciferol  6 mcg Intravenous Q M,W,F-HD  . famotidine  20 mg Oral QHS  . heparin  5,000 Units Subcutaneous Q8H  . levothyroxine  125 mcg Oral QAC breakfast  . midodrine  5  mg Oral TID WC  . sodium chloride flush  3 mL Intravenous Q12H  . traZODone  100 mg Oral QHS  . zolpidem  10 mg Oral QHS   Continuous Infusions: . sodium chloride 250 mL (12/08/17 1139)  .  ceFAZolin (ANCEF) IV 1 g (12/09/17 2305)     LOS: 3 days    Time spent: 25 minutes   Dessa Phi, DO [facility] [email or web address] Password [facility] 12/10/2017, 12:35 PM

## 2017-12-10 NOTE — Progress Notes (Signed)
  Echocardiogram 2D Echocardiogram has been performed.  Jennette Dubin 12/10/2017, 2:33 PM

## 2017-12-10 NOTE — Progress Notes (Signed)
[facility] for Infectious Disease  Date of Admission:  12/07/2017     Total days of antibiotics 3         ASSESSMENT/PLAN  Mr. Odwyer is on Day 3 of therapy with Cefazolin for MSSA bacteremia with likely source being osteomyelitis of the left great toe with plan for left great amputation. Repeat blood cultures are in process. TTE scheduled and pending with likely need for TEE. No other clear sources of infection at present.  1. Continue current dose of cefazolin. 2. Await TTE/TEE results. 3. Continue to monitor fevers, cultures, and WBC count.       [facility] Team Staphylococcus aureus bacteremia   Staphylococcus aureus bacteremia (SAB) is associated with a high rate of complications and mortality.  Specific aspects of clinical management are critical to optimizing the outcome of patients with SAB.  Therefore, the [facility] Antimicrobial Management Team [facility]) has initiated an intervention aimed at improving the management of SAB at [facility].  To do so, Infectious Diseases physicians are providing an evidence-based consult for the management of all patients with SAB.     Yes No Comments  Perform follow-up blood cultures (even if the patient is afebrile) to ensure clearance of bacteremia []  []  Pending   Remove vascular catheter and obtain follow-up blood cultures after the removal of the catheter []  []    Perform echocardiography to evaluate for endocarditis (transthoracic ECHO is 40-50% sensitive, TEE is > 90% sensitive) [x]  []  Pending  Consult electrophysiologist to evaluate implanted cardiac device (pacemaker, ICD) []  [x]    Ensure source control []  []  Pending - likely osteomyelitis with pending amputation.   Investigate for "metastatic" sites of infection []  []  No concern at present.   Change antibiotic therapy to ___Cefazolin_____ []  []  Beta-lactam antibiotics are preferred for MSSA due to higher cure rates.   If on Vancomycin, goal trough  should be 15 - 20 mcg/mL  Estimated duration of IV antibiotic therapy:   []  []  Pending investigation results.     Principal Problem:   MSSA bacteremia Active Problems:   End stage renal disease (HCC)   Fever   Right flank pain   . calcium acetate  2,668 mg Oral TID WC  . Chlorhexidine Gluconate Cloth  6 each Topical Q0600  . clopidogrel  75 mg Oral Daily  . darbepoetin (ARANESP) injection - DIALYSIS  25 mcg Intravenous Q Mon-HD  . doxercalciferol  6 mcg Intravenous Q M,W,F-HD  . famotidine  20 mg Oral QHS  . heparin  5,000 Units Subcutaneous Q8H  . levothyroxine  125 mcg Oral QAC breakfast  . midodrine  5 mg Oral TID WC  . sodium chloride flush  3 mL Intravenous Q12H  . traZODone  100 mg Oral QHS  . zolpidem  10 mg Oral QHS    SUBJECTIVE:  Afebrile overnight without leukocytosis. Seen by orthopedics with insensate neuropathy with abscess ulceration osteomyelitis of the left great toe with the recommendation for great toe amputation.  Not feeling well today. Denies fevers, chills, or sweats.    Allergies  Allergen Reactions  . Hydrocodone Other (See Comments)    LIKELY NOT REACTION TO HYDROCODONE Pt states that after 3 weeks of taking this medication he will began to "twitch" Pt states that after 3 weeks of taking this medication he will began to "twitch" LIKELY NOT REACTION TO HYDROCODONE Pt states that after 3 weeks of taking this medication he will began to "twitch" LIKELY NOT REACTION TO  HYDROCODONE Pt states that after 3 weeks of taking this medication he will began to "twitch" Pt states that after 3 weeks of taking this medication he will began to "twitch"  . Furadantin [Nitrofurantoin] Other (See Comments)    UNSPECIFIED REACTION   . Mandelamine [Methenamine] Other (See Comments)    UNSPECIFIED REACTION   . Noroxin [Norfloxacin] Other (See Comments)    UNSPECIFIED REACTION   . Carmine Nausea Only  . Contrast Media [Iodinated Diagnostic Agents] Nausea And  Vomiting    Oral dye causes vomiting, IV dye is okay  . Metrizamide Nausea And Vomiting    Oral dye causes vomiting, IV dye is okay  . Sulfa Antibiotics Cough    Childhood reaction - pt could not confirm that it was a cough  . Sulfur Cough    Childhood reaction - pt could not confirm that it was a cough     Review of Systems: Review of Systems  Constitutional: Positive for malaise/fatigue. Negative for chills and fever.  Respiratory: Negative for cough, sputum production, shortness of breath and wheezing.   Cardiovascular: Negative for chest pain and leg swelling.  Gastrointestinal: Negative for abdominal pain, constipation, diarrhea, nausea and vomiting.  Skin: Negative for rash.      OBJECTIVE: Vitals:   12/09/17 1719 12/09/17 2102 12/10/17 0436 12/10/17 0850  BP: (!) 150/81 123/62 106/62 (!) 93/43  Pulse: 73 74 67 62  Resp: 20 18 18 18   Temp: 98.6 F (37 C) 98.6 F (37 C) 98.4 F (36.9 C) 98.3 F (36.8 C)  TempSrc: Oral Oral  Oral  SpO2: 100% 99% 96% 96%  Weight:  122 kg    Height:       Body mass index is 37.52 kg/m.  Physical Exam  Constitutional: He is oriented to person, place, and time. He appears well-developed and well-nourished. He is cooperative. He appears ill. No distress.  Lying in bed with head of bed elevated.   Cardiovascular: Normal rate, regular rhythm, normal heart sounds and intact distal pulses.  Dialysis catheter appears without evidence of infection.   Pulmonary/Chest: Effort normal and breath sounds normal.  Neurological: He is alert and oriented to person, place, and time.  Skin: Skin is warm and dry.  Psychiatric: He has a normal mood and affect.    Lab Results Lab Results  Component Value Date   WBC 4.9 12/10/2017   HGB 9.7 (L) 12/10/2017   HCT 32.5 (L) 12/10/2017   MCV 95.3 12/10/2017   PLT 128 (L) 12/10/2017    Lab Results  Component Value Date   CREATININE 11.18 (H) 12/10/2017   BUN 67 (H) 12/10/2017   NA 135 12/10/2017     K 5.0 12/10/2017   CL 95 (L) 12/10/2017   CO2 27 12/10/2017    Lab Results  Component Value Date   ALT 14 12/07/2017   AST 19 12/07/2017   ALKPHOS 56 12/07/2017   BILITOT 1.1 12/07/2017     Microbiology: Recent Results (from the past 240 hour(s))  Culture, blood (Routine x 2)     Status: Abnormal   Collection Time: 12/07/17 11:15 AM  Result Value Ref Range Status   Specimen Description BLOOD RIGHT ANTECUBITAL  Final   Special Requests   Final    BOTTLES DRAWN AEROBIC AND ANAEROBIC Blood Culture adequate volume   Culture  Setup Time   Final    GRAM POSITIVE COCCI IN CLUSTERS IN BOTH AEROBIC AND ANAEROBIC BOTTLES CRITICAL RESULT CALLED TO, READ BACK BY  AND VERIFIED WITH: [place], [place] [id number] FCP Performed at [facility], [place] [address] [place], [place] [address]    Culture STAPHYLOCOCCUS AUREUS (A)  Final   Report Status 12/10/2017 FINAL  Final   Organism ID, Bacteria STAPHYLOCOCCUS AUREUS  Final      Susceptibility   Staphylococcus aureus - MIC*    CIPROFLOXACIN <=0.5 SENSITIVE Sensitive     ERYTHROMYCIN <=0.25 SENSITIVE Sensitive     GENTAMICIN <=0.5 SENSITIVE Sensitive     OXACILLIN 0.5 SENSITIVE Sensitive     TETRACYCLINE <=1 SENSITIVE Sensitive     VANCOMYCIN <=0.5 SENSITIVE Sensitive     TRIMETH/SULFA <=10 SENSITIVE Sensitive     CLINDAMYCIN <=0.25 SENSITIVE Sensitive     RIFAMPIN <=0.5 SENSITIVE Sensitive     Inducible Clindamycin NEGATIVE Sensitive     * STAPHYLOCOCCUS AUREUS  Blood Culture ID Panel (Reflexed)     Status: Abnormal   Collection Time: 12/07/17 11:15 AM  Result Value Ref Range Status   Enterococcus species NOT DETECTED NOT DETECTED Final   Listeria monocytogenes NOT DETECTED NOT DETECTED Final   Staphylococcus species DETECTED (A) NOT DETECTED Final    Comment: CRITICAL RESULT CALLED TO, READ BACK BY AND VERIFIED WITH: PHARMD ESPADA, M [id number] FCP    Staphylococcus aureus (BCID) DETECTED (A) NOT DETECTED Final    Comment:  Methicillin (oxacillin) susceptible Staphylococcus aureus (MSSA). Preferred therapy is anti staphylococcal beta lactam antibiotic (Cefazolin or Nafcillin), unless clinically contraindicated. CRITICAL RESULT CALLED TO, READ BACK BY AND VERIFIED WITH: PHARMD ESPADA, M [phone] [id number] FCP    Methicillin resistance NOT DETECTED NOT DETECTED Final   Streptococcus species NOT DETECTED NOT DETECTED Final   Streptococcus agalactiae NOT DETECTED NOT DETECTED Final   Streptococcus pneumoniae NOT DETECTED NOT DETECTED Final   Streptococcus pyogenes NOT DETECTED NOT DETECTED Final   Acinetobacter baumannii NOT DETECTED NOT DETECTED Final   Enterobacteriaceae species NOT DETECTED NOT DETECTED Final   Enterobacter cloacae complex NOT DETECTED NOT DETECTED Final   Escherichia coli NOT DETECTED NOT DETECTED Final   Klebsiella oxytoca NOT DETECTED NOT DETECTED Final   Klebsiella pneumoniae NOT DETECTED NOT DETECTED Final   Proteus species NOT DETECTED NOT DETECTED Final   Serratia marcescens NOT DETECTED NOT DETECTED Final   Haemophilus influenzae NOT DETECTED NOT DETECTED Final   Neisseria meningitidis NOT DETECTED NOT DETECTED Final   Pseudomonas aeruginosa NOT DETECTED NOT DETECTED Final   Candida albicans NOT DETECTED NOT DETECTED Final   Candida glabrata NOT DETECTED NOT DETECTED Final   Candida krusei NOT DETECTED NOT DETECTED Final   Candida parapsilosis NOT DETECTED NOT DETECTED Final   Candida tropicalis NOT DETECTED NOT DETECTED Final    Comment: Performed at [facility], [address], [place], [place] [address]  Culture, blood (Routine x 2)     Status: Abnormal   Collection Time: 12/07/17 11:17 AM  Result Value Ref Range Status   Specimen Description BLOOD BLOOD LEFT ARM  Final   Special Requests   Final    BOTTLES DRAWN AEROBIC AND ANAEROBIC Blood Culture adequate volume   Culture  Setup Time   Final    GRAM POSITIVE COCCI IN CLUSTERS ANAEROBIC BOTTLE ONLY CRITICAL VALUE NOTED.   VALUE IS CONSISTENT WITH PREVIOUSLY REPORTED AND CALLED VALUE.    Culture (A)  Final    STAPHYLOCOCCUS AUREUS SUSCEPTIBILITIES PERFORMED ON PREVIOUS CULTURE WITHIN THE LAST 5 DAYS. Performed at [facility], [place] [address] [place], [place]  [address]    Report Status 12/10/2017 FINAL  Final  MRSA PCR Screening     Status: None   Collection Time: 12/08/17 12:05 AM  Result Value Ref Range Status   MRSA by PCR NEGATIVE NEGATIVE Final    Comment:        The GeneXpert MRSA Assay (FDA approved for NASAL specimens only), is one component of a comprehensive MRSA colonization surveillance program. It is not intended to diagnose MRSA infection nor to guide or monitor treatment for MRSA infections. Performed at [facility], [place] [address], [place], [place] [address]      Terri Piedra, [facility] [phone] Pager  12/10/2017  11:36 AM

## 2017-12-10 NOTE — Progress Notes (Addendum)
[facility] Progress Note   Subjective:  Seen in room. C/o L great toe pain, otherwise feeling ok. Denies CP/dyspnea.  Objective Vitals:   12/09/17 1719 12/09/17 2102 12/10/17 0436 12/10/17 0850  BP: (!) 150/81 123/62 106/62 (!) 93/43  Pulse: 73 74 67 62  Resp: 20 18 18 18   Temp: 98.6 F (37 C) 98.6 F (37 C) 98.4 F (36.9 C) 98.3 F (36.8 C)  TempSrc: Oral Oral  Oral  SpO2: 100% 99% 96% 96%  Weight:  122 kg    Height:       Physical Exam General: Well appearing, NAD. Heart: RRR; no murmur Lungs: CTAB Abdomen: soft, non-tender Extremities: L foot bandaged, no LE edema Dialysis Access: TDC in R femoral/thigh  Additional Objective Labs: Basic Metabolic Panel: Recent Labs  Lab 12/08/17 0636 12/09/17 0541 12/09/17 1241 12/10/17 0431  NA 136 136  --  135  K 4.8 4.5  --  5.0  CL 93* 95*  --  95*  CO2 27 27  --  27  GLUCOSE 100* 111*  --  90  BUN 56* 44*  --  67*  CREATININE 10.99* 8.78*  --  11.18*  CALCIUM 8.0* 8.5*  --  8.7*  PHOS  --   --  4.2  --    Liver Function Tests: Recent Labs  Lab 12/07/17 1115  AST 19  ALT 14  ALKPHOS 56  BILITOT 1.1  PROT 8.3*  ALBUMIN 3.0*   CBC: Recent Labs  Lab 12/07/17 1115 12/08/17 0636 12/09/17 0541 12/10/17 0431  WBC 8.1 5.2 4.7 4.9  NEUTROABS 7.4  --   --   --   HGB 10.9* 10.0* 10.3* 9.7*  HCT 36.0* 32.4* 34.9* 32.5*  MCV 96.3 95.0 95.9 95.3  PLT 92* 91* 106* 128*   Blood Culture    Component Value Date/Time   SDES BLOOD BLOOD LEFT ARM 12/07/2017 1117   SPECREQUEST  12/07/2017 1117    BOTTLES DRAWN AEROBIC AND ANAEROBIC Blood Culture adequate volume   CULT (A) 12/07/2017 1117    STAPHYLOCOCCUS AUREUS SUSCEPTIBILITIES PERFORMED ON PREVIOUS CULTURE WITHIN THE LAST 5 DAYS. Performed at [facility], [place] [address], [place], [place] [address]    REPTSTATUS 12/10/2017 FINAL 12/07/2017 1117   Studies/Results: Ct Abdomen Pelvis Wo Contrast  Result Date: 12/09/2017 CLINICAL DATA:   BacteremiaBacteremia Right flank pain EXAM: CT ABDOMEN AND PELVIS WITHOUT CONTRAST TECHNIQUE: Multidetector CT imaging of the abdomen and pelvis was performed following the standard protocol without IV contrast. COMPARISON:  CT abdomen 5 21 2010 FINDINGS: Lower chest: Lung bases are clear. Hepatobiliary: No focal hepatic lesion on noncontrast exam. Pancreas: Pancreas is normal. No ductal dilatation. No pancreatic inflammation. Spleen: Normal spleen Adrenals/urinary tract: Adrenal glands normal. Kidneys are atrophic. Multiple renal calculi. No hydronephrosis. Bladder is decompressed Stomach/Bowel: Stomach, small-bowel cecum normal. There is a ileostomy in the RIGHT lower quadrant. Small parastomal hernia. No bowel obstruction. Colon rectosigmoid colon normal. Vascular/Lymphatic: Abdominal aorta is normal caliber. Grafts within the LEFT common iliac vein. There is central venous line in the RIGHT common iliac vein extending the IVC. Venous collaterals along the abdominal wall. Reproductive: Prostate normal Other: No free fluid or free air Musculoskeletal: Diffuse increase in bone sclerosis suggesting renal osteodystrophy IMPRESSION: 1. No clear acute findings in the abdomen pelvis. 2. Atrophy kidneys. 3. RIGHT lower quadrant ileostomy without bowel obstruction. 4. 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 No evidence of acute infection or inflammation. 6. Large-bore central  venous line from a RIGHT groin approach Electronically Signed   By: Suzy Bouchard M.D.   On: 12/09/2017 00:46   Medications: . sodium chloride 250 mL (12/08/17 1139)  .  ceFAZolin (ANCEF) IV 1 g (12/09/17 2305)   . calcium acetate  2,668 mg Oral TID WC  . Chlorhexidine Gluconate Cloth  6 each Topical Q0600  . clopidogrel  75 mg Oral Daily  . doxercalciferol  6 mcg Intravenous Q M,W,F-HD  . famotidine  20 mg Oral QHS  . heparin  5,000 Units Subcutaneous Q8H  . levothyroxine  125 mcg Oral QAC breakfast  .  midodrine  5 mg Oral TID WC  . sodium chloride flush  3 mL Intravenous Q12H  . traZODone  100 mg Oral QHS  . zolpidem  10 mg Oral QHS    Dialysis Orders: MWF -AF 4.5h  450/800  122.5kg   2/2.25 bath  R thigh TDC  Hep 3700  - Parsabiv 5mg  qHD - Hectorol48mcg IV qHD  Assessment/Plan: 1. MSSA Bacteremia: Presumed due to L foot osteomyelitis. On Cefazolin per pharm. TDC does not appear infected.  As access options are extremely limited would prefer to try to clear infection without having to remove cath. ID consulting, ordered TTE.  2. Foot Ulcer/LLE cellulitis - Per ortho +Osteomyelitis of L great toe.  Plans for amputation early this week by Dr. Sharol Given.  3. R flank wound near old nephrostomy site - CT abd showed no signs of infection.  4. ESRD: Continue HD per MWF schedule. For HD today.  Difficulties with blood flows in [facility] with last HD, given Activase dwell.  Hopeful for improvement today. 5. Hypotension/volume: BP chronically low, on midodrine for BP support. Follow. 6. Anemiaof CKD: Hgb 9.7, no ESA as outpatient. Will go ahead and given small dose today (Aranesp 25) anticipating drop with surgery. 7. Secondary Hyperparathyroidism: CorrCa/Phos ok. Continue VDRA and binders. Unable to get parsabiv because not available in hospital 8. Nutrition -Currently on regular diet. Follow labs.Renavite.  9. Hx seizures - per primary 10. GERD 11. migraines  [name], PA-C 12/10/2017, 11:11 AM  [facility] Pager: [phone]  Pt seen, examined and agree w A/P as above.  [name] MD [employer] pager [phone]   12/10/2017, 11:36 AM

## 2017-12-11 ENCOUNTER — Encounter (HOSPITAL_COMMUNITY)
Admission: EM | Disposition: A | Discharge status: Skilled Nursing Facility | Payer: Self-pay | Attending: Internal Medicine

## 2017-12-11 ENCOUNTER — Encounter (HOSPITAL_COMMUNITY): Payer: Self-pay | Admitting: Certified Registered Nurse Anesthetist

## 2017-12-11 ENCOUNTER — Inpatient Hospital Stay (HOSPITAL_COMMUNITY): Payer: Medicare Other | Admitting: Anesthesiology

## 2017-12-11 HISTORY — PX: AMPUTATION: SHX166

## 2017-12-11 LAB — CBC
HCT: 32.6 % — ABNORMAL LOW (ref 39.0–52.0)
Hemoglobin: 9.8 g/dL — ABNORMAL LOW (ref 13.0–17.0)
MCH: 28.5 pg (ref 26.0–34.0)
MCHC: 30.1 g/dL (ref 30.0–36.0)
MCV: 94.8 fL (ref 80.0–100.0)
Platelets: 136 10*3/uL — ABNORMAL LOW (ref 150–400)
RBC: 3.44 MIL/uL — ABNORMAL LOW (ref 4.22–5.81)
RDW: 16.2 % — ABNORMAL HIGH (ref 11.5–15.5)
WBC: 4.9 10*3/uL (ref 4.0–10.5)
nRBC: 0 % (ref 0.0–0.2)

## 2017-12-11 LAB — BASIC METABOLIC PANEL
Anion gap: 15 (ref 5–15)
BUN: 85 mg/dL — ABNORMAL HIGH (ref 6–20)
CO2: 27 mmol/L (ref 22–32)
Calcium: 9.3 mg/dL (ref 8.9–10.3)
Chloride: 95 mmol/L — ABNORMAL LOW (ref 98–111)
Creatinine, Ser: 12.92 mg/dL — ABNORMAL HIGH (ref 0.61–1.24)
GFR calc Af Amer: 5 mL/min — ABNORMAL LOW (ref >60)
GFR calc non Af Amer: 4 mL/min — ABNORMAL LOW (ref >60)
Glucose, Bld: 81 mg/dL (ref 70–99)
Potassium: 5.9 mmol/L — ABNORMAL HIGH (ref 3.5–5.1)
Sodium: 137 mmol/L (ref 135–145)

## 2017-12-11 SURGERY — AMPUTATION DIGIT
Anesthesia: Monitor Anesthesia Care | Site: Foot | Laterality: Left

## 2017-12-11 MED ORDER — ALTEPLASE 2 MG IJ SOLR
6.0000 mg | Freq: Once | INTRAMUSCULAR | Status: AC
  Administered 2017-12-11: 6 mg
  Filled 2017-12-11: qty 6

## 2017-12-11 MED ORDER — ONDANSETRON HCL 4 MG/2ML IJ SOLN: 4.0000 mg | Freq: Four times a day (QID) | INTRAMUSCULAR | Status: DC | PRN

## 2017-12-11 MED ORDER — ALTEPLASE 2 MG IJ SOLR
INTRAMUSCULAR | Status: AC
  Administered 2017-12-11: 6 mg
  Filled 2017-12-11: qty 6

## 2017-12-11 MED ORDER — HYDROCODONE-ACETAMINOPHEN 7.5-325 MG PO TABS
1.0000 | ORAL_TABLET | ORAL | Status: DC | PRN
  Filled 2017-12-11: qty 1
  Filled 2017-12-11 (×2): qty 2

## 2017-12-11 MED ORDER — METOCLOPRAMIDE HCL 5 MG/ML IJ SOLN: 5.0000 mg | Freq: Three times a day (TID) | INTRAMUSCULAR | Status: DC | PRN

## 2017-12-11 MED ORDER — MORPHINE SULFATE (PF) 2 MG/ML IV SOLN
0.5000 mg | INTRAVENOUS | Status: DC | PRN
  Administered 2017-12-11 – 2017-12-16 (×16): 1 mg via INTRAVENOUS
  Filled 2017-12-11 (×17): qty 1

## 2017-12-11 MED ORDER — ONDANSETRON HCL 4 MG PO TABS: 4.0000 mg | ORAL_TABLET | Freq: Four times a day (QID) | ORAL | Status: DC | PRN

## 2017-12-11 MED ORDER — HYDROCODONE-ACETAMINOPHEN 5-325 MG PO TABS: 1.0000 | ORAL_TABLET | ORAL | Status: DC | PRN

## 2017-12-11 MED ORDER — HYDROMORPHONE HCL 1 MG/ML IJ SOLN
INTRAMUSCULAR | Status: AC
  Filled 2017-12-11: qty 1

## 2017-12-11 MED ORDER — 0.9 % SODIUM CHLORIDE (POUR BTL) OPTIME
TOPICAL | Status: DC | PRN
  Administered 2017-12-11 (×2): 1000 mL

## 2017-12-11 MED ORDER — HEPARIN SODIUM (PORCINE) 1000 UNIT/ML IJ SOLN
INTRAMUSCULAR | Status: AC
  Administered 2017-12-11: 6000 [IU]
  Filled 2017-12-11: qty 6

## 2017-12-11 MED ORDER — PROPOFOL 10 MG/ML IV BOLUS
INTRAVENOUS | Status: DC | PRN
  Administered 2017-12-11: 150 mg via INTRAVENOUS

## 2017-12-11 MED ORDER — MIDAZOLAM HCL 2 MG/2ML IJ SOLN
INTRAMUSCULAR | Status: AC
  Filled 2017-12-11: qty 2

## 2017-12-11 MED ORDER — ONDANSETRON HCL 4 MG/2ML IJ SOLN
INTRAMUSCULAR | Status: DC | PRN
  Administered 2017-12-11: 4 mg via INTRAVENOUS

## 2017-12-11 MED ORDER — HYDROMORPHONE HCL 1 MG/ML IJ SOLN
0.2500 mg | INTRAMUSCULAR | Status: DC | PRN
  Administered 2017-12-11 (×4): 0.5 mg via INTRAVENOUS

## 2017-12-11 MED ORDER — METHOCARBAMOL 1000 MG/10ML IJ SOLN
500.0000 mg | Freq: Four times a day (QID) | INTRAVENOUS | Status: DC | PRN
  Filled 2017-12-11: qty 5

## 2017-12-11 MED ORDER — OXYCODONE HCL 5 MG PO TABS
ORAL_TABLET | ORAL | Status: AC
  Filled 2017-12-11: qty 1

## 2017-12-11 MED ORDER — ACETAMINOPHEN 325 MG PO TABS
325.0000 mg | ORAL_TABLET | Freq: Four times a day (QID) | ORAL | Status: DC | PRN
  Administered 2017-12-14: 650 mg via ORAL
  Filled 2017-12-11 (×2): qty 2

## 2017-12-11 MED ORDER — METOCLOPRAMIDE HCL 5 MG PO TABS: 5.0000 mg | ORAL_TABLET | Freq: Three times a day (TID) | ORAL | Status: DC | PRN

## 2017-12-11 MED ORDER — BISACODYL 10 MG RE SUPP: 10.0000 mg | Freq: Every day | RECTAL | Status: DC | PRN

## 2017-12-11 MED ORDER — MORPHINE SULFATE (PF) 2 MG/ML IV SOLN
INTRAVENOUS | Status: AC
  Administered 2017-12-11: 1 mg via INTRAVENOUS
  Filled 2017-12-11: qty 1

## 2017-12-11 MED ORDER — OXYCODONE HCL 5 MG PO TABS
5.0000 mg | ORAL_TABLET | Freq: Once | ORAL | Status: AC | PRN
  Administered 2017-12-11: 5 mg via ORAL

## 2017-12-11 MED ORDER — FENTANYL CITRATE (PF) 100 MCG/2ML IJ SOLN
INTRAMUSCULAR | Status: DC | PRN
  Administered 2017-12-11 (×2): 50 ug via INTRAVENOUS

## 2017-12-11 MED ORDER — PROPOFOL 10 MG/ML IV BOLUS
INTRAVENOUS | Status: AC
  Filled 2017-12-11: qty 20

## 2017-12-11 MED ORDER — SODIUM CHLORIDE 0.9 % IV SOLN: 100.0000 mL | INTRAVENOUS | Status: DC | PRN

## 2017-12-11 MED ORDER — SODIUM CHLORIDE 0.9 % IV SOLN
INTRAVENOUS | Status: DC
  Administered 2017-12-11: 12:00:00 via INTRAVENOUS

## 2017-12-11 MED ORDER — MAGNESIUM CITRATE PO SOLN: 1.0000 | Freq: Once | ORAL | Status: DC | PRN

## 2017-12-11 MED ORDER — PHENYLEPHRINE 40 MCG/ML (10ML) SYRINGE FOR IV PUSH (FOR BLOOD PRESSURE SUPPORT)
PREFILLED_SYRINGE | INTRAVENOUS | Status: AC
  Filled 2017-12-11: qty 10

## 2017-12-11 MED ORDER — HEPARIN SODIUM (PORCINE) 1000 UNIT/ML DIALYSIS
1000.0000 [IU] | INTRAMUSCULAR | Status: DC | PRN
  Administered 2017-12-12: 6000 [IU] via INTRAVENOUS_CENTRAL

## 2017-12-11 MED ORDER — HEPARIN SODIUM (PORCINE) 1000 UNIT/ML IJ SOLN
INTRAMUSCULAR | Status: AC
  Administered 2017-12-11: 1000 [IU]
  Filled 2017-12-11: qty 1

## 2017-12-11 MED ORDER — POLYETHYLENE GLYCOL 3350 17 G PO PACK: 17.0000 g | PACK | Freq: Every day | ORAL | Status: DC | PRN

## 2017-12-11 MED ORDER — OXYCODONE HCL 5 MG/5ML PO SOLN: 5.0000 mg | Freq: Once | ORAL | Status: AC | PRN

## 2017-12-11 MED ORDER — ONDANSETRON HCL 4 MG/2ML IJ SOLN: 4.0000 mg | Freq: Once | INTRAMUSCULAR | Status: DC | PRN

## 2017-12-11 MED ORDER — SODIUM CHLORIDE 0.9 % IV SOLN
INTRAVENOUS | Status: DC
  Administered 2017-12-11: 08:00:00 via INTRAVENOUS

## 2017-12-11 MED ORDER — CEFAZOLIN SODIUM-DEXTROSE 1-4 GM/50ML-% IV SOLN
1.0000 g | INTRAVENOUS | Status: AC
  Administered 2017-12-12: 1 g via INTRAVENOUS
  Filled 2017-12-11: qty 50

## 2017-12-11 MED ORDER — FENTANYL CITRATE (PF) 250 MCG/5ML IJ SOLN
INTRAMUSCULAR | Status: AC
  Filled 2017-12-11: qty 5

## 2017-12-11 MED ORDER — ALTEPLASE 2 MG IJ SOLR
2.0000 mg | Freq: Once | INTRAMUSCULAR | Status: DC | PRN
  Filled 2017-12-11: qty 2

## 2017-12-11 MED ORDER — DOCUSATE SODIUM 100 MG PO CAPS
100.0000 mg | ORAL_CAPSULE | Freq: Two times a day (BID) | ORAL | Status: DC
  Administered 2017-12-11 – 2017-12-17 (×6): 100 mg via ORAL
  Filled 2017-12-11 (×10): qty 1

## 2017-12-11 MED ORDER — ONDANSETRON HCL 4 MG/2ML IJ SOLN
INTRAMUSCULAR | Status: AC
  Filled 2017-12-11: qty 2

## 2017-12-11 MED ORDER — MIDAZOLAM HCL 5 MG/5ML IJ SOLN
INTRAMUSCULAR | Status: DC | PRN
  Administered 2017-12-11: 2 mg via INTRAVENOUS

## 2017-12-11 MED ORDER — METHOCARBAMOL 500 MG PO TABS: 500.0000 mg | ORAL_TABLET | Freq: Four times a day (QID) | ORAL | Status: DC | PRN

## 2017-12-11 SURGICAL SUPPLY — 31 items
BLADE SURG 21 STRL SS (BLADE) ×2 IMPLANT
BNDG COHESIVE 4X5 TAN STRL (GAUZE/BANDAGES/DRESSINGS) ×2 IMPLANT
BNDG ESMARK 4X9 LF (GAUZE/BANDAGES/DRESSINGS) IMPLANT
BNDG GAUZE ELAST 4 BULKY (GAUZE/BANDAGES/DRESSINGS) ×2 IMPLANT
COVER SURGICAL LIGHT HANDLE (MISCELLANEOUS) ×4 IMPLANT
COVER WAND RF STERILE (DRAPES) ×2 IMPLANT
DRAPE U-SHAPE 47X51 STRL (DRAPES) ×2 IMPLANT
DRSG ADAPTIC 3X8 NADH LF (GAUZE/BANDAGES/DRESSINGS) ×1 IMPLANT
DRSG PAD ABDOMINAL 8X10 ST (GAUZE/BANDAGES/DRESSINGS) ×2 IMPLANT
DURAPREP 26ML APPLICATOR (WOUND CARE) ×2 IMPLANT
ELECT CAUTERY BLADE 6.4 (BLADE) ×1 IMPLANT
ELECT REM PT RETURN 9FT ADLT (ELECTROSURGICAL) ×2
ELECTRODE REM PT RTRN 9FT ADLT (ELECTROSURGICAL) ×1 IMPLANT
GAUZE SPONGE 4X4 12PLY STRL (GAUZE/BANDAGES/DRESSINGS) ×1 IMPLANT
GLOVE BIOGEL PI IND STRL 9 (GLOVE) ×1 IMPLANT
GLOVE BIOGEL PI INDICATOR 9 (GLOVE) ×1
GLOVE SURG ORTHO 9.0 STRL STRW (GLOVE) ×2 IMPLANT
GOWN STRL REUS W/ TWL XL LVL3 (GOWN DISPOSABLE) ×2 IMPLANT
GOWN STRL REUS W/TWL XL LVL3 (GOWN DISPOSABLE) ×2
KIT BASIN OR (CUSTOM PROCEDURE TRAY) ×2 IMPLANT
KIT TURNOVER KIT B (KITS) ×2 IMPLANT
MANIFOLD NEPTUNE II (INSTRUMENTS) ×2 IMPLANT
NEEDLE 22X1 1/2 (OR ONLY) (NEEDLE) ×1 IMPLANT
NS IRRIG 1000ML POUR BTL (IV SOLUTION) ×2 IMPLANT
PACK ORTHO EXTREMITY (CUSTOM PROCEDURE TRAY) ×2 IMPLANT
PAD ARMBOARD 7.5X6 YLW CONV (MISCELLANEOUS) ×4 IMPLANT
SUT ETHILON 2 0 PSLX (SUTURE) ×2 IMPLANT
SYR CONTROL 10ML LL (SYRINGE) ×1 IMPLANT
TOWEL GREEN STERILE (TOWEL DISPOSABLE) ×1 IMPLANT
TUBE CONNECTING 12X1/4 (SUCTIONS) ×1 IMPLANT
YANKAUER SUCT BULB TIP NO VENT (SUCTIONS) ×1 IMPLANT

## 2017-12-11 NOTE — Progress Notes (Addendum)
[facility] Progress Note   Subjective:  Seen on HD, 3L UF goal - tolerating so far. BFR ~300 with TDC. Scheduling error and was supposed to be dialyzed yesterday, but missed. Will get HD today and tomorrow again to get back on track.  No CP/dyspnea. S/p toe amputation this morning, went ok.  Objective Vitals:   12/11/17 1045 12/11/17 1100 12/11/17 1134 12/11/17 1210  BP: 125/68 126/72 130/72 130/70  Pulse: 69 70 70 70  Resp: 20 16 19 19   Temp:  98.1 F (36.7 C) 98.5 F (36.9 C) 98.8 F (37.1 C)  TempSrc:   Oral Oral  SpO2: 93% 96% 97% 96%  Weight:      Height:       Physical Exam General: Well appearing, NAD. Heart: RRR; no murmur Lungs: CTAB Abdomen: soft, non-tender Extremities: L foot bandaged, no LE edema Dialysis Access: TDC in R femoral/thigh  Additional Objective Labs: Basic Metabolic Panel: Recent Labs  Lab 12/09/17 0541 12/09/17 1241 12/10/17 0431 12/11/17 0433  NA 136  --  135 137  K 4.5  --  5.0 5.9*  CL 95*  --  95* 95*  CO2 27  --  27 27  GLUCOSE 111*  --  90 81  BUN 44*  --  67* 85*  CREATININE 8.78*  --  11.18* 12.92*  CALCIUM 8.5*  --  8.7* 9.3  PHOS  --  4.2  --   --    Liver Function Tests: Recent Labs  Lab 12/07/17 1115  AST 19  ALT 14  ALKPHOS 56  BILITOT 1.1  PROT 8.3*  ALBUMIN 3.0*   CBC: Recent Labs  Lab 12/07/17 1115 12/08/17 0636 12/09/17 0541 12/10/17 0431 12/11/17 0433  WBC 8.1 5.2 4.7 4.9 4.9  NEUTROABS 7.4  --   --   --   --   HGB 10.9* 10.0* 10.3* 9.7* 9.8*  HCT 36.0* 32.4* 34.9* 32.5* 32.6*  MCV 96.3 95.0 95.9 95.3 94.8  PLT 92* 91* 106* 128* 136*   Blood Culture    Component Value Date/Time   SDES BLOOD BLOOD LEFT ARM 12/07/2017 1117   SPECREQUEST  12/07/2017 1117    BOTTLES DRAWN AEROBIC AND ANAEROBIC Blood Culture adequate volume   CULT (A) 12/07/2017 1117    STAPHYLOCOCCUS AUREUS SUSCEPTIBILITIES PERFORMED ON PREVIOUS CULTURE WITHIN THE LAST 5 DAYS. Performed at [place], [place] [address] [place], [place] [address]    REPTSTATUS 12/10/2017 FINAL 12/07/2017 1117   Medications: . sodium chloride Stopped (12/08/17 1326)  . sodium chloride 10 mL/hr at 12/11/17 0825  . sodium chloride 10 mL/hr at 12/11/17 1216  .  ceFAZolin (ANCEF) IV 1 g (12/10/17 2155)  . methocarbamol (ROBAXIN) IV     . calcium acetate  2,668 mg Oral TID WC  . Chlorhexidine Gluconate Cloth  6 each Topical Q0600  . clopidogrel  75 mg Oral Daily  . darbepoetin (ARANESP) injection - DIALYSIS  25 mcg Intravenous Q Mon-HD  . docusate sodium  100 mg Oral BID  . doxercalciferol  6 mcg Intravenous Q M,W,F-HD  . famotidine  20 mg Oral QHS  . heparin  5,000 Units Subcutaneous Q8H  . HYDROmorphone      . HYDROmorphone      . levothyroxine  125 mcg Oral QAC breakfast  . midodrine  5 mg Oral TID WC  . oxyCODONE      . sodium chloride flush  3 mL Intravenous Q12H  . traZODone  100 mg Oral QHS  . zolpidem  10 mg Oral QHS    Dialysis Orders: MWF -AF 4.5h  450/800  122.5kg   2/2.25 bath  R thigh TDC  Hep 3700  - Parsabiv 5mg  qHD - Hectorol10mcg IV qHD  Assessment/Plan: 1. MSSA Bacteremia: Presumed due to L foot osteomyelitis.On Cefazolin per pharm.TDC does not appear infected. As access options are extremely limited would prefer to try to clear infection without having to remove cath. ID consulting, ordered TTE.  2. Foot Ulcer/LLE cellulitis -Per ortho +Osteomyelitis of L great toe. Plans for amputation early this week by Dr. Sharol Given.  3. R flank wound near old nephrostomy site - CTabd showed no signs of infection. 4. ESRD: Continue HD per MWF schedule. For HD today, then back to usual. Difficulties with blood flows in [facility] with last HD, givenActivase dwell. 5. Hypotension/volume: BP chronically low, on midodrine for BP support. Follow. 6. Anemiaof CKD: Hgb 9.7, no ESA as outpatient. Will go ahead and given small dose today (Aranesp 25) anticipating drop with surgery. 7. Secondary  Hyperparathyroidism: CorrCa/Phos ok.Continue VDRA and binders. Unable to get parsabiv because not available in hospital 8. Nutrition -Currently on regular diet. Follow labs.Renavite.  9. Hx seizures - per primary 10. GERD 11. migraines  ADDENDUM (1:55PM): BFR down to 200 with TDC, running poorly. Will limp along as well as can today. VVS consulted for [facility] exchange for dysfunction (as well as Staph bacteremia).  [name], PA-C 12/11/2017, 1:37 PM  [facility] Pager: [phone]

## 2017-12-11 NOTE — Interval H&P Note (Signed)
History and Physical Interval Note:  12/11/2017 9:24 AM  [name]  has presented today for surgery, with the diagnosis of Osteomyelitis Left Great Toe  The various methods of treatment have been discussed with the patient and family. After consideration of risks, benefits and other options for treatment, the patient has consented to  Procedure(s): Left Great Toe Amputation (Left) as a surgical intervention .  The patient's history has been reviewed, patient examined, no change in status, stable for surgery.  I have reviewed the patient's chart and labs.  Questions were answered to the patient's satisfaction.     [name]

## 2017-12-11 NOTE — Consult Note (Addendum)
[facility] [place] CONSULT NOTE  VASCULAR SURGERY ASSESSMENT & PLAN:   POORLY FUNCTIONING RIGHT FEMORAL TUNNELED DIALYSIS CATHETER: this patient has a difficult access situation.  He has had multiple access procedures in both upper extremities in [place].  Since he moved to [facility] has had multiple procedures on his left thigh graft.  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  He had a tunneled dialysis catheter placed through a collateral vein in his right groin.  He did have a hematoma after that but the catheter was working well.  Most recently on 09/20/2017 his catheter was not working well and he underwent attempted placement of a new catheter but the true lumen could not be entered on either side.  Currently the issue is his catheter is having low flow rates.  We were consult to consider changing this over a wire.  I have discussed this with Dr. Servando Snare and given that his only remaining option would be placement of a IVC catheter via a lumbar approach he does feel that it would be worth 1 more attempt at exchanging the catheter on the right side.  This will be scheduled for Thursday.  I have discussed this with nephrology.  If this is not successful then he would require placement of an IVC catheter via a lumbar approach.  Deitra Mayo, MD, [place] [phone] Office: [phone]   MRN : [id number]  Reason for Consult: [facility] exchange Referring Physician: Stephania Fragmin PA-C  History of Present Illness: 52 y/o male with malfunctioning right groin collateral vein TDC.  He is out of options for peripheral access.    Findings: Common femoral vein was sclerotic entire lumen was taken by the existing catheter.  I could not get through true luminal he had a perforation of the external iliac vein on the right.  The right common femoral artery was quite  friable and manipulating it had to be repaired.  I was able to cannulate the left common femoral vein with ultrasound guidance but also could not get through true luminal he and had a perforation of the left external iliac vein.              Patient has limited options for further access and will continue to use the tunnel catheter for now.    He has had multiple accesses in his bilateral upper extremities in [place] and his surgeries in [place] have included multiple procedures on his left thigh AV graft.  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 Sharol Given.    Sepsis secondary to GT MSSA bacteremia.  He is currently on Vancomycin and Cefazolin.  Nephrology states they do not believe the [facility] is infected.     Current Facility-Administered Medications  Medication Dose Route Frequency Provider Last Rate Last Dose  . heparin 1000 UNIT/ML injection           . 0.9 %  sodium chloride infusion  250 mL Intravenous PRN [name], MD   Stopped at 12/08/17 1326  . 0.9 %  sodium chloride infusion   Intravenous Continuous [name], MD 10 mL/hr  at 12/11/17 0825    . 0.9 %  sodium chloride infusion   Intravenous Continuous [name], MD 10 mL/hr at 12/11/17 1216    . [START ON 12/12/2017] acetaminophen (TYLENOL) tablet 325-650 mg  325-650 mg Oral Q6H PRN [name], MD      . bisacodyl (DULCOLAX) suppository 10 mg  10 mg Rectal Daily PRN [name], MD      . calcium acetate (PHOSLO) capsule 2,668 mg  2,668 mg Oral TID WC [name], MD   2,668 mg at 12/11/17 1212   And  . calcium acetate (PHOSLO) capsule 2,001 mg  2,001 mg Oral PRN [name], MD      . Derrill Memo ON 12/12/2017] ceFAZolin (ANCEF) IVPB 1 g/50 mL premix  1 g Intravenous Q24H Dessa Phi,  DO      . Chlorhexidine Gluconate Cloth 2 % PADS 6 each  6 each Topical Q0600 [name], MD   6 each at 12/10/17 1827  . clopidogrel (PLAVIX) tablet 75 mg  75 mg Oral Daily [name], MD   75 mg at 12/11/17 1211  . Darbepoetin Alfa (ARANESP) injection 25 mcg  25 mcg Intravenous Q Mon-HD [name], MD      . docusate sodium (COLACE) capsule 100 mg  100 mg Oral BID [name], MD      . doxercalciferol (HECTOROL) injection 6 mcg  6 mcg Intravenous Q M,W,F-HD [name], MD      . famotidine (PEPCID) tablet 20 mg  20 mg Oral QHS [name], MD   20 mg at 12/10/17 2158  . fluticasone (FLONASE) 50 MCG/ACT nasal spray 2 spray  2 spray Each Nare PRN [name], MD      . heparin injection 5,000 Units  5,000 Units Subcutaneous Q8H [name], MD   5,000 Units at 12/10/17 2159  . HYDROcodone-acetaminophen (NORCO) 7.5-325 MG per tablet 1-2 tablet  1-2 tablet Oral Q4H PRN [name], MD      . HYDROmorphone (DILAUDID) 1 MG/ML injection           . HYDROmorphone (DILAUDID) 1 MG/ML injection           . levothyroxine (SYNTHROID, LEVOTHROID) tablet 125 mcg  125 mcg Oral QAC breakfast [name], MD   125 mcg at 12/11/17 1211  . LORazepam (ATIVAN) injection 1 mg  1 mg Intravenous Q6H PRN [name], MD   1 mg at 12/11/17 0008  . magnesium citrate solution 1 Bottle  1 Bottle Oral Once PRN [name], MD      . methocarbamol (ROBAXIN) tablet 500 mg  500 mg Oral Q6H PRN [name], MD       Or  . methocarbamol (ROBAXIN) 500 mg in dextrose 5 % 50 mL IVPB  500 mg Intravenous Q6H PRN [name], MD      . metoCLOPramide (REGLAN) tablet 5-10 mg  5-10 mg Oral Q8H PRN [name], MD       Or  . metoCLOPramide (REGLAN) injection 5-10 mg  5-10 mg Intravenous Q8H PRN [name], MD      . midodrine (PROAMATINE) tablet 5 mg  5 mg Oral TID WC [name], MD   5 mg at 12/11/17 1211  . morphine 2 MG/ML injection 0.5-1 mg  0.5-1 mg Intravenous Q2H PRN  [name], MD   1 mg at 12/11/17  1351  . ondansetron (ZOFRAN) tablet 4 mg  4 mg Oral Q6H PRN [name], MD       Or  . ondansetron [facility]) injection 4 mg  4 mg Intravenous Q6H PRN [name], MD      . oxyCODONE (Oxy IR/ROXICODONE) 5 MG immediate release tablet           . polyethylene glycol (MIRALAX / GLYCOLAX) packet 17 g  17 g Oral Daily PRN [name], MD      . promethazine (PHENERGAN) injection 25 mg  25 mg Intravenous Q6H PRN [name], MD   25 mg at 12/11/17 0008  . sodium chloride flush (NS) 0.9 % injection 3 mL  3 mL Intravenous Q12H [name], MD   3 mL at 12/10/17 2200  . sodium chloride flush (NS) 0.9 % injection 3 mL  3 mL Intravenous PRN [name], MD   3 mL at 12/11/17 0800  . traZODone (DESYREL) tablet 100 mg  100 mg Oral QHS [name], MD   100 mg at 12/10/17 2156  . zolpidem (AMBIEN) tablet 10 mg  10 mg Oral QHS [name], MD   10 mg at 12/09/17 0030    Pt meds include: Statin :No Betablocker: No ASA: No Other anticoagulants/antiplatelets: Plavix  Past Medical History:  Diagnosis Date  . Anemia   . Anxiety   . Constipation   . End stage renal failure on dialysis [facility])    "[place]; MWF" (02/29/2016)  . GERD (gastroesophageal reflux disease)   . Grand mal seizure ([place]) X 4   last 1998 (02/29/2016)  . Heart murmur    no problems with per pt. - nephrologist and Dr. Coletta Memos  . History of blood transfusion 2000s   "I was having blood loss; never found out from where"  . History of kidney stones   . Hypertension    hx of - has not taken bp meds in over 2 years  . Hypothyroidism   . Insomnia   . Migraine    "due to my BP in my late 1990s; none since" (02/29/2016)  . Nonhealing surgical wound    of the left arteriovenous graft  . Pneumonia    x 2  . Shortness of breath    rarely  . Spina bifida [facility])     Past Surgical History:  Procedure Laterality Date  . APPENDECTOMY    . ARTERIOVENOUS GRAFT PLACEMENT Left  10/16/2012   left femoral goretex graft         Dr Donnetta Hutching  . AV FISTULA PLACEMENT Left 06/27/2012   Procedure: EXPLORATORY LEFT THY-GRAFT PSEUDO-ANEURYSM;  Surgeon: [employer], MD;  Location: [place];  Service: Vascular;  Laterality: Left;  Revision of left Arteriovenus gortex graft in thigh.  . COLONOSCOPY    . I&D EXTREMITY Left 02/29/2016   Procedure: DEBRIDEMENT LEFT THIGH WOUND;  Surgeon: Waynetta Sandy, MD;  Location: [place];  Service: Vascular;  Laterality: Left;  . ILEOSTOMY  1970s  . INCISION AND DRAINAGE Left 10/16/2012   Procedure: INCISION AND Debridement left thigh graft;  Surgeon: Rosetta Posner, MD;  Location: [place];  Service: Vascular;  Laterality: Left;  . INSERTION OF DIALYSIS CATHETER    . INSERTION OF DIALYSIS CATHETER  01/14/2016   Procedure: INSERTION OF DIALYSIS CATHETER;  Surgeon: Waynetta Sandy, MD;  Location: [place];  Service: Vascular;;  . INSERTION OF DIALYSIS CATHETER Right 08/07/2017  Procedure: INSERTION OF DIALYSIS CATHETER;  Surgeon: Waynetta Sandy, MD;  Location: [place];  Service: Vascular;  Laterality: Right;  . INSERTION OF ILIAC STENT Left 01/14/2016   Procedure: INSERTION OF ILIAC STENT;  Surgeon: Waynetta Sandy, MD;  Location: [place];  Service: Vascular;  Laterality: Left;  . INTRAOPERATIVE ARTERIOGRAM Left 01/14/2016   Procedure: INTRA OPERATIVE ARTERIOGRAM;  Surgeon: Waynetta Sandy, MD;  Location: [place];  Service: Vascular;  Laterality: Left;  . LITHOTRIPSY  x 3   "& a laser treatment" (02/29/2016)  . NEPHROSTOMY Bilateral 1968  . PATELLAR TENDON REPAIR Right 1990s   "big incision"  . PERIPHERAL VASCULAR CATHETERIZATION  01/14/2016   Procedure: A/V SHUNTOGRAM;  Surgeon: Waynetta Sandy, MD;  Location: [place];  Service: Vascular;;  . REVISION OF ARTERIOVENOUS GORETEX GRAFT Left 10/16/2012   Procedure: REVISION OF LEFT FEMORAL LOOP ARTERIOVENOUS GORETEX GRAFT;  Surgeon: Rosetta Posner, MD;  Location: [place];   Service: Vascular;  Laterality: Left;  . REVISION OF ARTERIOVENOUS GORETEX GRAFT Left 02/11/2015   Procedure: EXCISION OF SMALL SEGMENT OF EXPOSED LEFT THIGH NON FUNCTIONING  ARTERIOVENOUS GORETEX GRAFT;  Surgeon: Mal Misty, MD;  Location: [place];  Service: Vascular;  Laterality: Left;  . REVISION OF ARTERIOVENOUS GORETEX GRAFT Left 01/14/2016   Procedure: REVISION OF ARTERIOVENOUS GORETEX GRAFT;  Surgeon: Waynetta Sandy, MD;  Location: [place];  Service: Vascular;  Laterality: Left;  . REVISION OF ARTERIOVENOUS GORETEX GRAFT Left 02/29/2016   thigh/pt report  . REVISION OF ARTERIOVENOUS GORETEX GRAFT Left 02/29/2016   Procedure: POSSIBLE REVISION OF LEFT THIGH ARTERIOVENOUS GORETEX GRAFT;  Surgeon: Waynetta Sandy, MD;  Location: [place];  Service: Vascular;  Laterality: Left;  . THROMBECTOMY W/ EMBOLECTOMY Left 01/14/2016   Procedure: THROMBECTOMY ARTERIOVENOUS GORE-TEX Left thigh GRAFT;  Surgeon: Waynetta Sandy, MD;  Location: [place];  Service: Vascular;  Laterality: Left;  . TONGUE SURGERY  ~ 1990   tongue-tie release   . ULTRASOUND GUIDANCE FOR VASCULAR ACCESS Right 08/07/2017   Procedure: ULTRASOUND GUIDANCE FOR VASCULAR CANNULATION RIGHT FEMORAL VEIN AND LEFT AV FEMORAL GRAFT;  Surgeon: Waynetta Sandy, MD;  Location: [place];  Service: Vascular;  Laterality: Right;  . UPPER EXTREMITY VENOGRAPHY Bilateral 08/09/2017   Procedure: UPPER EXTREMITY VENOGRAPHY;  Surgeon: Serafina Mitchell, MD;  Location: [place] [facility];  Service: Cardiovascular;  Laterality: Bilateral;  . VENOGRAM N/A 09/20/2017   Procedure: RIGHT COMMON FEMORAL ARTERY EXPLORATION.  CANNULATION RIGHT COMMON FEMORAL VEIN. VENOGRAM CENTRAL ULTRA SOUND GUIDED RIGHT FEMORAL VEIN TIMES TWO.;  Surgeon: Waynetta Sandy, MD;  Location: [place];  Service: Vascular;  Laterality: N/A;  . WOUND DEBRIDEMENT Left 02/29/2016   thigh    Social History Social History   Tobacco Use  . Smoking status:  Never Smoker  . Smokeless tobacco: Never Used  Substance Use Topics  . Alcohol use: Not Currently    Alcohol/week: 0.0 standard drinks    Comment: 02/29/2016 "nothing since  the mid 1990s  . Drug use: Not Currently    Types: Marijuana    Comment: 02/29/2016 "nothing since ~ 1995"    Family History Family History  Problem Relation Age of Onset  . Diabetes Mother   . Hypertension Mother   . Heart disease Mother        before age 61  . Diabetes Father   . Heart attack Father        X's 3  . Diabetes Sister   .  Bipolar disorder Sister     Allergies  Allergen Reactions  . Furadantin [Nitrofurantoin] Other (See Comments)    UNSPECIFIED REACTION   . Mandelamine [Methenamine] Other (See Comments)    UNSPECIFIED REACTION   . Noroxin [Norfloxacin] Other (See Comments)    UNSPECIFIED REACTION   . [place]ine Nausea Only  . Contrast Media [Iodinated Diagnostic Agents] Nausea And Vomiting    Oral dye causes vomiting, IV dye is okay  . Hydrocodone Other (See Comments)    LIKELY NOT REACTION TO HYDROCODONE Pt states that after 3 weeks of taking this medication he will began to "twitch"  . Metrizamide Nausea And Vomiting    Oral dye causes vomiting, IV dye is okay  . Sulfa Antibiotics Cough    Childhood reaction - pt could not confirm that it was a cough  . Sulfur Cough    Childhood reaction - pt could not confirm that it was a cough     REVIEW OF SYSTEMS  General: [ ]  Weight loss, [x ] Fever, [ ]  chills Neurologic: [ ]  Dizziness, [ ]  Blackouts, [ ]  Seizure [ ]  Stroke, [ ]  "Mini stroke", [ ]  Slurred speech, [ ]  Temporary blindness; [ ]  weakness in arms or legs, [ ]  Hoarseness [ ]  Dysphagia Cardiac: [ ]  Chest pain/pressure, [ ]  Shortness of breath at rest [ ]  Shortness of breath with exertion, [ ]  Atrial fibrillation or irregular heartbeat  Vascular: [ ]  Pain in legs with walking, [ ]  Pain in legs at rest, [ ]  Pain in legs at night,  [x ] Non-healing ulcer, [ ]  Blood clot in vein/DVT,    Pulmonary: [ ]  Home oxygen, [ ]  Productive cough, [ ]  Coughing up blood, [ ]  Asthma,  [ ]  Wheezing [ ]  COPD Musculoskeletal:  [ ]  Arthritis, [ ]  Low back pain, [ ]  Joint pain Hematologic: [ ]  Easy Bruising, [x ] Anemia; [ ]  Hepatitis Gastrointestinal: [ ]  Blood in stool, [ ]  Gastroesophageal Reflux/heartburn, Urinary: [ ]  chronic Kidney disease, [x ] on HD - [x ] MWF or [ ]  TTHS, [ ]  Burning with urination, [ ]  Difficulty urinating Skin: [ ]  Rashes, [x ] Wounds Psychological: [ ]  Anxiety, [x ] Depression  Physical Examination Vitals:   12/11/17 1045 12/11/17 1100 12/11/17 1134 12/11/17 1210  BP: 125/68 126/72 130/72 130/70  Pulse: 69 70 70 70  Resp: 20 16 19 19   Temp:  98.1 F (36.7 C) 98.5 F (36.9 C) 98.8 F (37.1 C)  TempSrc:   Oral Oral  SpO2: 93% 96% 97% 96%  Weight:      Height:       Body mass index is 37.52 kg/m.  General:  WDWN in NAD HENT: WNL, normocephalic Eyes: Pupils equal Pulmonary: normal non-labored breathing , without Rales, rhonchi,  wheezing Cardiac: RRR, without  Murmurs, rubs or gallops; No carotid bruits Abdomen: soft, NT, no masses Skin: no rashes, ulcers noted;  no Gangrene , no cellulitis; no open wounds;   Vascular Exam/Pulses:radial pulses   Musculoskeletal: no muscle wasting or atrophy; Neurologic: A&O X 3; Appropriate Affect ;  SENSATION: normal; MOTOR FUNCTION: moves all 4 ext.  Speech is fluent/normal   Significant Diagnostic Studies: CBC Lab Results  Component Value Date   WBC 4.9 12/11/2017   HGB 9.8 (L) 12/11/2017   HCT 32.6 (L) 12/11/2017   MCV 94.8 12/11/2017   PLT 136 (L) 12/11/2017    BMET    Component Value Date/Time   NA 137 12/11/2017  0433   K 5.9 (H) 12/11/2017 0433   CL 95 (L) 12/11/2017 0433   CO2 27 12/11/2017 0433   GLUCOSE 81 12/11/2017 0433   BUN 85 (H) 12/11/2017 0433   CREATININE 12.92 (H) 12/11/2017 0433   CALCIUM 9.3 12/11/2017 0433   GFRNONAA 4 (L) 12/11/2017 0433   GFRAA 5 (L) 12/11/2017  0433   Estimated Creatinine Clearance: 8.9 mL/min (A) (by C-G formula based on SCr of 12.92 mg/dL (H)).  COAG Lab Results  Component Value Date   INR 1.15 12/07/2017   INR 1.05 01/14/2016   INR 1.1 08/23/2008       ASSESSMENT/PLAN:  ESRD Malfunctioning right groin femoral collateral TDC.  He is currently on HD s/p left GT amputation by Dr. Sharol Given for Osteomyelitis and MSSA bacteremia.   Plan for HD access tomorrow questionable access area please refer to Dr. Claretha Cooper OR notes.   Roxy Horseman 12/11/2017 2:17 PM

## 2017-12-11 NOTE — Anesthesia Procedure Notes (Signed)
Procedure Name: LMA Insertion Date/Time: 12/11/2017 9:36 AM Performed by: Inda Coke, CRNA Pre-anesthesia Checklist: Patient identified, Emergency Drugs available, Suction available and Patient being monitored Patient Re-evaluated:Patient Re-evaluated prior to induction Oxygen Delivery Method: Circle System Utilized Preoxygenation: Pre-oxygenation with 100% oxygen Induction Type: IV induction Ventilation: Mask ventilation without difficulty LMA: LMA inserted LMA Size: 5.0 Number of attempts: 1 Airway Equipment and Method: Bite block Placement Confirmation: positive ETCO2 Tube secured with: Tape Dental Injury: Teeth and Oropharynx as per pre-operative assessment

## 2017-12-11 NOTE — Op Note (Signed)
12/11/2017  9:58 AM  PATIENT:  [name]    PRE-OPERATIVE DIAGNOSIS:  Osteomyelitis Left Great Toe  POST-OPERATIVE DIAGNOSIS:  Same  PROCEDURE:  Left Great Toe Amputation  SURGEON:  [name], MD  PHYSICIAN ASSISTANT:None ANESTHESIA:   General  PREOPERATIVE INDICATIONS:  [name] is a  52 y.o. male with a diagnosis of Osteomyelitis Left Great Toe who failed conservative measures and elected for surgical management.    The risks benefits and alternatives were discussed with the patient preoperatively including but not limited to the risks of infection, bleeding, nerve injury, cardiopulmonary complications, the need for revision surgery, among others, and the patient was willing to proceed.  OPERATIVE IMPLANTS: None  @ENCIMAGES @  OPERATIVE FINDINGS: Good petechial bleeding no abscess of the MTP joint  OPERATIVE PROCEDURE: Patient was brought the operating room and underwent a general anesthetic.  After adequate levels anesthesia obtained patient's left lower extremity was prepped using DuraPrep draped in a sterile field a timeout was called.  A fishmouth incision was made just distal to the MTP joint.  The great toe was amputated through the MTP joint.  There was good petechial bleeding electrocautery was used for hemostasis the wound was irrigated with normal saline.  The skin was closed using 2-0 nylon.  A sterile dressing was applied patient was extubated taken the PACU in stable condition   DISCHARGE PLANNING:  Antibiotic duration: No additional antibiotics needed  Weightbearing: Nonweightbearing on the left  Pain medication: Low-dose opioid pathway ordered  Dressing care/ Wound VAC: Keep dressing clean dry and intact  Ambulatory devices: Crutches or walker  Discharge to: Anticipate discharge to home later this week.  Follow-up: In the office 1 week post operative.

## 2017-12-11 NOTE — Transfer of Care (Signed)
Immediate Anesthesia Transfer of Care Note  Patient: [name]  Procedure(s) Performed: Left Great Toe Amputation (Left Foot)  Patient Location: PACU  Anesthesia Type:General  Level of Consciousness: awake and alert   Airway & Oxygen Therapy: Patient Spontanous Breathing and Patient connected to nasal cannula oxygen  Post-op Assessment: Report given to RN and Post -op Vital signs reviewed and stable  Post vital signs: Reviewed and stable  Last Vitals:  Vitals Value Taken Time  BP 114/55 12/11/2017  9:56 AM  Temp    Pulse 73 12/11/2017  9:58 AM  Resp 20 12/11/2017  9:58 AM  SpO2 93 % 12/11/2017  9:58 AM  Vitals shown include unvalidated device data.  Last Pain:  Vitals:   12/11/17 0730  TempSrc:   PainSc: 0-No pain      Patients Stated Pain Goal: 0 (64/33/29 5188)  Complications: No apparent anesthesia complications

## 2017-12-11 NOTE — Anesthesia Postprocedure Evaluation (Signed)
Anesthesia Post Note  Patient: [name]  Procedure(s) Performed: Left Great Toe Amputation (Left Foot)     Patient location during evaluation: PACU Anesthesia Type: MAC Level of consciousness: awake and alert Pain management: pain level controlled Vital Signs Assessment: post-procedure vital signs reviewed and stable Respiratory status: spontaneous breathing, nonlabored ventilation and respiratory function stable Cardiovascular status: blood pressure returned to baseline and stable Postop Assessment: no apparent nausea or vomiting Anesthetic complications: no    Last Vitals:  Vitals:   12/11/17 1134 12/11/17 1210  BP: 130/72 130/70  Pulse: 70 70  Resp: 19 19  Temp: 36.9 C 37.1 C  SpO2: 97% 96%    Last Pain:  Vitals:   12/11/17 1210  TempSrc: Oral  PainSc:                  Lidia Collum

## 2017-12-11 NOTE — Progress Notes (Signed)
PROGRESS NOTE    [name]  FGH:[id number] DOB: [birth year] DOA: 12/07/2017 PCP: [name], MD     Brief Narrative:  [name] is a 52 year old male with past medical history significant for end-stage renal disease on hemodialysis, peripheral artery disease, hypothyroidism, hypotension on midodrine, who presents with fevers and chills.  He admits to 1 week history of not feeling well, fever of 102 at home.  He has also been having left great toe pain and redness that has been getting worse in the past 2 weeks.  He was in dialysis when he was found to be febrile at one 1.5, he was started on vancomycin, Fortaz.  Also complaining of right flank pain at the site of prior nephrostomy site, reports history of bleeding from the site as well.  In the emergency department, he was found to be afebrile, WBC 8.1.  He was admitted for treatment of what was thought to be left toe cellulitis.  MRI revealed osteomyelitis of the left great toe. Blood cultures revealed MSSA. Patient was seen by orthopedic surgery and underwent amputation of left great toe on 12/10.   New events last 24 hours / Subjective: Upset that HD was not done yesterday. Some pain in left toe.   Assessment & Plan:   Principal Problem:   MSSA bacteremia Active Problems:   End stage renal disease ([place])   Fever   Right flank pain   Osteomyelitis of great toe of left foot (HCC)   Sepsis secondary to left toe osteomyelitis, MSSA bacteremia  -MRI revealed: Soft tissue wound involving the plantar aspect of the great toe extending to the IP joint. Small first IP joint joint effusion with marrow edema on either side of the joint concerning for septic arthritis and osteomyelitis. -Orthopedic surgery planning for left first great toe amputation this week -Infectious disease following -Echocardiogram unremarkable for vegetation. ?TEE  -Vanco/ceftazidime --> Cefazolin   Right flank pain tenderness, status post right  nephrostomy tube in the past -CT abdomen pelvis without acute findings -Right flank pain improving  ESRD on hemodialysis Per nephrology  Hypothyroidism Continue Synthroid  Chronic hypotension Continue Midodrine   PAD Continue plavix   Anxiety Ativan   DVT prophylaxis: Subq hep Code Status: Full Family Communication: No family at bedside Disposition Plan: Pending post-op recovery. PT OT    Consultants:   Nephrology  ID  Orthopedic surgery   Procedures:   None   Antimicrobials:  Anti-infectives (From admission, onward)   Start     Dose/Rate Route Frequency Ordered Stop   12/11/17 0800  ceFAZolin (ANCEF) 3 g in dextrose 5 % 50 mL IVPB     3 g 100 mL/hr over 30 Minutes Intravenous To [facility] 12/10/17 1508 12/11/17 0937   12/10/17 2000  cefTAZidime (FORTAZ) 2 g in sodium chloride 0.9 % 100 mL IVPB  Status:  Discontinued     2 g 200 mL/hr over 30 Minutes Intravenous Every M-W-F (2000) 12/07/17 2218 12/08/17 1029   12/10/17 1200  vancomycin (VANCOCIN) IVPB 1000 mg/200 mL premix  Status:  Discontinued     1,000 mg 200 mL/hr over 60 Minutes Intravenous Every M-W-F (Hemodialysis) 12/07/17 2218 12/08/17 1029   12/08/17 2200  ceFAZolin (ANCEF) IVPB 1 g/50 mL premix     1 g 100 mL/hr over 30 Minutes Intravenous Every 24 hours 12/08/17 1250     12/08/17 2000  cefTAZidime (FORTAZ) 2 g in sodium chloride 0.9 % 100 mL IVPB  Status:  Discontinued  2 g 200 mL/hr over 30 Minutes Intravenous  Once 12/07/17 2218 12/08/17 1157   12/08/17 1200  vancomycin (VANCOCIN) IVPB 1000 mg/200 mL premix  Status:  Discontinued     1,000 mg 200 mL/hr over 60 Minutes Intravenous Every T-Th-Sa (Hemodialysis) 12/07/17 2218 12/08/17 1157   12/08/17 1100  ceFAZolin (ANCEF) IVPB 1 g/50 mL premix  Status:  Discontinued     1 g 100 mL/hr over 30 Minutes Intravenous Every 24 hours 12/08/17 1029 12/08/17 1250   12/08/17 0000  vancomycin (VANCOCIN) IVPB 1000 mg/200 mL premix  Status:   Discontinued     1,000 mg 200 mL/hr over 60 Minutes Intravenous Every M-W-F (Hemodialysis) 12/07/17 2216 12/07/17 2218   12/08/17 0000  cefTAZidime (FORTAZ) 2 g in sodium chloride 0.9 % 100 mL IVPB  Status:  Discontinued     2 g 200 mL/hr over 30 Minutes Intravenous Every M-W-F (2000) 12/07/17 2216 12/07/17 2218   12/07/17 2215  cefTAZidime (FORTAZ) 2 g in sodium chloride 0.9 % 100 mL IVPB  Status:  Discontinued     2 g 200 mL/hr over 30 Minutes Intravenous Every M-W-F (2000) 12/07/17 2209 12/07/17 2216       Objective: Vitals:   12/11/17 1045 12/11/17 1100 12/11/17 1134 12/11/17 1210  BP: 125/68 126/72 130/72 130/70  Pulse: 69 70 70 70  Resp: 20 16 19 19   Temp:  98.1 F (36.7 C) 98.5 F (36.9 C) 98.8 F (37.1 C)  TempSrc:   Oral Oral  SpO2: 93% 96% 97% 96%  Weight:      Height:        Intake/Output Summary (Last 24 hours) at 12/11/2017 1211 Last data filed at 12/11/2017 0946 Gross per 24 hour  Intake 972.78 ml  Output 10 ml  Net 962.78 ml   Filed Weights   12/08/17 1300 12/08/17 1804 12/09/17 2102  Weight: 122.7 kg 122 kg 122 kg    Examination: General exam: Appears calm and comfortable  Respiratory system: Clear to auscultation. Respiratory effort normal. Cardiovascular system: S1 & S2 heard, RRR. +systolic murmur  Gastrointestinal system: Abdomen is nondistended, soft and nontender. No organomegaly or masses felt. Normal bowel sounds heard. Central nervous system: Alert and oriented. No focal neurological deficits. Extremities: Left foot in cast  Psychiatry: Judgement and insight appear normal. Mood & affect appropriate.    Data Reviewed: I have personally reviewed following labs and imaging studies  CBC: Recent Labs  Lab 12/07/17 1115 12/08/17 0636 12/09/17 0541 12/10/17 0431 12/11/17 0433  WBC 8.1 5.2 4.7 4.9 4.9  NEUTROABS 7.4  --   --   --   --   HGB 10.9* 10.0* 10.3* 9.7* 9.8*  HCT 36.0* 32.4* 34.9* 32.5* 32.6*  MCV 96.3 95.0 95.9 95.3 94.8    PLT 92* 91* 106* 128* 384*   Basic Metabolic Panel: Recent Labs  Lab 12/07/17 1115 12/08/17 0636 12/09/17 0541 12/09/17 1241 12/10/17 0431 12/11/17 0433  NA 134* 136 136  --  135 137  K 4.3 4.8 4.5  --  5.0 5.9*  CL 92* 93* 95*  --  95* 95*  CO2 24 27 27   --  27 27  GLUCOSE 101* 100* 111*  --  90 81  BUN 39* 56* 44*  --  67* 85*  CREATININE 9.00* 10.99* 8.78*  --  11.18* 12.92*  CALCIUM 8.0* 8.0* 8.5*  --  8.7* 9.3  PHOS  --   --   --  4.2  --   --  GFR: Estimated Creatinine Clearance: 8.9 mL/min (A) (by C-G formula based on SCr of 12.92 mg/dL (H)). Liver Function Tests: Recent Labs  Lab 12/07/17 1115  AST 19  ALT 14  ALKPHOS 56  BILITOT 1.1  PROT 8.3*  ALBUMIN 3.0*   No results for input(s): LIPASE, AMYLASE in the last 168 hours. No results for input(s): AMMONIA in the last 168 hours. Coagulation Profile: Recent Labs  Lab 12/07/17 1115  INR 1.15   Cardiac Enzymes: Recent Labs  Lab 12/07/17 1243  TROPONINI 0.04*   BNP (last 3 results) No results for input(s): PROBNP in the last 8760 hours. HbA1C: No results for input(s): HGBA1C in the last 72 hours. CBG: No results for input(s): GLUCAP in the last 168 hours. Lipid Profile: No results for input(s): CHOL, HDL, LDLCALC, TRIG, CHOLHDL, LDLDIRECT in the last 72 hours. Thyroid Function Tests: No results for input(s): TSH, T4TOTAL, FREET4, T3FREE, THYROIDAB in the last 72 hours. Anemia Panel: No results for input(s): VITAMINB12, FOLATE, FERRITIN, TIBC, IRON, RETICCTPCT in the last 72 hours. Sepsis Labs: Recent Labs  Lab 12/07/17 1130  LATICACIDVEN 1.33    Recent Results (from the past 240 hour(s))  Culture, blood (Routine x 2)     Status: Abnormal   Collection Time: 12/07/17 11:15 AM  Result Value Ref Range Status   Specimen Description BLOOD RIGHT ANTECUBITAL  Final   Special Requests   Final    BOTTLES DRAWN AEROBIC AND ANAEROBIC Blood Culture adequate volume   Culture  Setup Time   Final     GRAM POSITIVE COCCI IN CLUSTERS IN BOTH AEROBIC AND ANAEROBIC BOTTLES CRITICAL RESULT CALLED TO, READ BACK BY AND VERIFIED WITH: [place], [place] [id number] FCP Performed at [facility], [place] [address] [place], [place] [address]    Culture STAPHYLOCOCCUS AUREUS (A)  Final   Report Status 12/10/2017 FINAL  Final   Organism ID, Bacteria STAPHYLOCOCCUS AUREUS  Final      Susceptibility   Staphylococcus aureus - MIC*    CIPROFLOXACIN <=0.5 SENSITIVE Sensitive     ERYTHROMYCIN <=0.25 SENSITIVE Sensitive     GENTAMICIN <=0.5 SENSITIVE Sensitive     OXACILLIN 0.5 SENSITIVE Sensitive     TETRACYCLINE <=1 SENSITIVE Sensitive     VANCOMYCIN <=0.5 SENSITIVE Sensitive     TRIMETH/SULFA <=10 SENSITIVE Sensitive     CLINDAMYCIN <=0.25 SENSITIVE Sensitive     RIFAMPIN <=0.5 SENSITIVE Sensitive     Inducible Clindamycin NEGATIVE Sensitive     * STAPHYLOCOCCUS AUREUS  Blood Culture ID Panel (Reflexed)     Status: Abnormal   Collection Time: 12/07/17 11:15 AM  Result Value Ref Range Status   Enterococcus species NOT DETECTED NOT DETECTED Final   Listeria monocytogenes NOT DETECTED NOT DETECTED Final   Staphylococcus species DETECTED (A) NOT DETECTED Final    Comment: CRITICAL RESULT CALLED TO, READ BACK BY AND VERIFIED WITH: PHARMD ESPADA, M [id number] FCP    Staphylococcus aureus (BCID) DETECTED (A) NOT DETECTED Final    Comment: Methicillin (oxacillin) susceptible Staphylococcus aureus (MSSA). Preferred therapy is anti staphylococcal beta lactam antibiotic (Cefazolin or Nafcillin), unless clinically contraindicated. CRITICAL RESULT CALLED TO, READ BACK BY AND VERIFIED WITH: PHARMD ESPADA, M [id number] FCP    Methicillin resistance NOT DETECTED NOT DETECTED Final   Streptococcus species NOT DETECTED NOT DETECTED Final   Streptococcus agalactiae NOT DETECTED NOT DETECTED Final   Streptococcus pneumoniae NOT DETECTED NOT DETECTED Final   Streptococcus pyogenes NOT DETECTED NOT DETECTED  Final   Acinetobacter baumannii NOT DETECTED NOT DETECTED Final   Enterobacteriaceae species NOT DETECTED NOT DETECTED Final   Enterobacter cloacae complex NOT DETECTED NOT DETECTED Final   Escherichia coli NOT DETECTED NOT DETECTED Final   Klebsiella oxytoca NOT DETECTED NOT DETECTED Final   Klebsiella pneumoniae NOT DETECTED NOT DETECTED Final   Proteus species NOT DETECTED NOT DETECTED Final   Serratia marcescens NOT DETECTED NOT DETECTED Final   Haemophilus influenzae NOT DETECTED NOT DETECTED Final   Neisseria meningitidis NOT DETECTED NOT DETECTED Final   Pseudomonas aeruginosa NOT DETECTED NOT DETECTED Final   Candida albicans NOT DETECTED NOT DETECTED Final   Candida glabrata NOT DETECTED NOT DETECTED Final   Candida krusei NOT DETECTED NOT DETECTED Final   Candida parapsilosis NOT DETECTED NOT DETECTED Final   Candida tropicalis NOT DETECTED NOT DETECTED Final    Comment: Performed at [facility], [place] [address], [place], [place] [address]  Culture, blood (Routine x 2)     Status: Abnormal   Collection Time: 12/07/17 11:17 AM  Result Value Ref Range Status   Specimen Description BLOOD BLOOD LEFT ARM  Final   Special Requests   Final    BOTTLES DRAWN AEROBIC AND ANAEROBIC Blood Culture adequate volume   Culture  Setup Time   Final    GRAM POSITIVE COCCI IN CLUSTERS ANAEROBIC BOTTLE ONLY CRITICAL VALUE NOTED.  VALUE IS CONSISTENT WITH PREVIOUSLY REPORTED AND CALLED VALUE.    Culture (A)  Final    STAPHYLOCOCCUS AUREUS SUSCEPTIBILITIES PERFORMED ON PREVIOUS CULTURE WITHIN THE LAST 5 DAYS. Performed at [facility], [place] [address] [place], [place] [address]    Report Status 12/10/2017 FINAL  Final  MRSA PCR Screening     Status: None   Collection Time: 12/08/17 12:05 AM  Result Value Ref Range Status   MRSA by PCR NEGATIVE NEGATIVE Final    Comment:        The GeneXpert MRSA Assay (FDA approved for NASAL specimens only), is one component of  a comprehensive MRSA colonization surveillance program. It is not intended to diagnose MRSA infection nor to guide or monitor treatment for MRSA infections. Performed at [facility], [place] [address], [place], [place] [address]        Radiology Studies: No results found.    Scheduled Meds: . calcium acetate  2,668 mg Oral TID WC  . Chlorhexidine Gluconate Cloth  6 each Topical Q0600  . clopidogrel  75 mg Oral Daily  . darbepoetin (ARANESP) injection - DIALYSIS  25 mcg Intravenous Q Mon-HD  . docusate sodium  100 mg Oral BID  . doxercalciferol  6 mcg Intravenous Q M,W,F-HD  . famotidine  20 mg Oral QHS  . heparin  5,000 Units Subcutaneous Q8H  . HYDROmorphone      . HYDROmorphone      . levothyroxine  125 mcg Oral QAC breakfast  . midodrine  5 mg Oral TID WC  . oxyCODONE      . sodium chloride flush  3 mL Intravenous Q12H  . traZODone  100 mg Oral QHS  . zolpidem  10 mg Oral QHS   Continuous Infusions: . sodium chloride Stopped (12/08/17 1326)  . sodium chloride 10 mL/hr at 12/11/17 0825  . sodium chloride    .  ceFAZolin (ANCEF) IV 1 g (12/10/17 2155)  . methocarbamol (ROBAXIN) IV       LOS: 4 days    Time spent: 25 minutes  Dessa Phi, DO [facility] [email or web address] Password [email or web address] 12/11/2017, 12:11 PM

## 2017-12-12 ENCOUNTER — Encounter (HOSPITAL_COMMUNITY): Payer: Self-pay | Admitting: Orthopedic Surgery

## 2017-12-12 DIAGNOSIS — Z8739 Personal history of other diseases of the musculoskeletal system and connective tissue: Secondary | ICD-10-CM

## 2017-12-12 DIAGNOSIS — Z89412 Acquired absence of left great toe: Secondary | ICD-10-CM

## 2017-12-12 LAB — BASIC METABOLIC PANEL
Anion gap: 17 — ABNORMAL HIGH (ref 5–15)
BUN: 92 mg/dL — ABNORMAL HIGH (ref 6–20)
CO2: 21 mmol/L — ABNORMAL LOW (ref 22–32)
Calcium: 8.4 mg/dL — ABNORMAL LOW (ref 8.9–10.3)
Chloride: 96 mmol/L — ABNORMAL LOW (ref 98–111)
Creatinine, Ser: 13.41 mg/dL — ABNORMAL HIGH (ref 0.61–1.24)
GFR calc Af Amer: 4 mL/min — ABNORMAL LOW (ref >60)
GFR calc non Af Amer: 4 mL/min — ABNORMAL LOW (ref >60)
Glucose, Bld: 74 mg/dL (ref 70–99)
Potassium: 6.5 mmol/L (ref 3.5–5.1)
Sodium: 134 mmol/L — ABNORMAL LOW (ref 135–145)

## 2017-12-12 LAB — CBC
HCT: 31.2 % — ABNORMAL LOW (ref 39.0–52.0)
Hemoglobin: 9.4 g/dL — ABNORMAL LOW (ref 13.0–17.0)
MCH: 28.8 pg (ref 26.0–34.0)
MCHC: 30.1 g/dL (ref 30.0–36.0)
MCV: 95.7 fL (ref 80.0–100.0)
Platelets: 126 10*3/uL — ABNORMAL LOW (ref 150–400)
RBC: 3.26 MIL/uL — ABNORMAL LOW (ref 4.22–5.81)
RDW: 16.3 % — ABNORMAL HIGH (ref 11.5–15.5)
WBC: 4.6 10*3/uL (ref 4.0–10.5)
nRBC: 0 % (ref 0.0–0.2)

## 2017-12-12 MED ORDER — CEFAZOLIN SODIUM-DEXTROSE 2-4 GM/100ML-% IV SOLN: 2.0000 g | INTRAVENOUS | Status: DC

## 2017-12-12 MED ORDER — SODIUM ZIRCONIUM CYCLOSILICATE 10 G PO PACK
10.0000 g | PACK | Freq: Once | ORAL | Status: AC
  Administered 2017-12-12: 10 g via ORAL
  Filled 2017-12-12: qty 1

## 2017-12-12 MED ORDER — DARBEPOETIN ALFA 40 MCG/0.4ML IJ SOSY
PREFILLED_SYRINGE | INTRAMUSCULAR | Status: AC
  Filled 2017-12-12: qty 0.4

## 2017-12-12 MED ORDER — CEFAZOLIN SODIUM-DEXTROSE 1-4 GM/50ML-% IV SOLN
1.0000 g | INTRAVENOUS | Status: DC
  Administered 2017-12-14: 1 g via INTRAVENOUS
  Filled 2017-12-12: qty 50

## 2017-12-12 MED ORDER — DARBEPOETIN ALFA 40 MCG/0.4ML IJ SOSY
40.0000 ug | PREFILLED_SYRINGE | INTRAMUSCULAR | Status: DC
  Administered 2017-12-12: 40 ug via INTRAVENOUS
  Filled 2017-12-12: qty 0.4

## 2017-12-12 MED ORDER — HYDROMORPHONE HCL 1 MG/ML IJ SOLN
1.0000 mg | Freq: Once | INTRAMUSCULAR | Status: DC
  Filled 2017-12-12: qty 1

## 2017-12-12 MED ORDER — CEFAZOLIN SODIUM-DEXTROSE 1-4 GM/50ML-% IV SOLN
1.0000 g | INTRAVENOUS | Status: AC
  Filled 2017-12-12: qty 50

## 2017-12-12 MED ORDER — DOXERCALCIFEROL 4 MCG/2ML IV SOLN
INTRAVENOUS | Status: AC
  Filled 2017-12-12: qty 4

## 2017-12-12 MED ORDER — HEPARIN SODIUM (PORCINE) 1000 UNIT/ML IJ SOLN
INTRAMUSCULAR | Status: AC
  Administered 2017-12-12: 6000 [IU] via INTRAVENOUS_CENTRAL
  Filled 2017-12-12: qty 6

## 2017-12-12 NOTE — Evaluation (Signed)
Physical Therapy Evaluation Patient Details Name: [name] MRN: [id number] DOB: 09/27/1965 Today's Date: 12/12/2017   History of Present Illness  [name] is a 52 year old male with past medical history significant for end-stage renal disease on hemodialysis, peripheral artery disease, hypothyroidism, hypotension on midodrine, who presents with fevers and chills.  He admits to 1 week history of not feeling well, fever of 102 at home.  He has also been having left great toe pain and redness that has been getting worse in the past 2 weeks. L great toe osteomyelitis, now s/p amputation, TDWB; difficulty with HD access, plans for vascular procedure 12/12  Clinical Impression   Pt admitted with above diagnosis. Pt currently with functional limitations due to the deficits listed below (see PT Problem List). Not quite clear on his prior functional status; He tells OT/PT that he crawls in the home (did not give details as to why); Still sounds likely he is managing independently, albeit, unconventionally; Presents with decr functional mobility, decr balance, now with Weight bearing restrictions LLE, significantly effecting mobility and ADLs;  Pt will benefit from skilled PT to increase their independence and safety with mobility to allow discharge to the venue listed below.    It seems to me that transfer and wheelchair training will address and support [name]'s mobility and ADL needs; He tells [place] that his landlord does not allow wheelchairs in the home (reasoning: it messes up the tile?!); would an MD note, or invoking ADA help?     Follow Up Recommendations Home health PT;Supervision - Intermittent;Other (comment)(Will depend on medical course; [facility])    Equipment Recommendations  Wheelchair (measurements PT);Wheelchair cushion (measurements PT);Rolling walker with 5" wheels;Other (comment)(consider drop-arm BSC; possibly knee scooter)    Recommendations for Other Services OT  consult(Chaplain, Palliative Care Medicine)     Precautions / Restrictions Precautions Precautions: Fall Precaution Comments: Pt reports longstanding issues with balance Required Braces or Orthoses: Other Brace Other Brace: post-op shoe L foot      Mobility  Bed Mobility Overal bed mobility: Needs Assistance Bed Mobility: Supine to Sit;Sit to Supine     Supine to sit: Min guard Sit to supine: Min guard   General bed mobility comments: Not needing physical assist, but minguard for safety as he was quite impulsive  Transfers Overall transfer level: Needs assistance Equipment used: Rolling walker (2 wheeled) Transfers: Sit to/from Stand(and Modified stand-pivot transfer) Sit to Stand: Min assist;+2 safety/equipment;Mod assist         General transfer comment: Min assist overall to steady RW as he tended to pull up on it; [name] initiated transfer having R foot on the floor, and by turning body towards bed to prop L knee on bed in standing (much like on uses a knee scooter); was able to briefly let LLE off of the bed and do a modified pivot on R foot towards HOB, during this transfer, his hips and trunk were significantly flexed over RW; +2 for safety  Ambulation/Gait                Stairs            Wheelchair Mobility    Modified Rankin (Stroke Patients Only)       Balance Overall balance assessment: Needs assistance           Standing balance-Leahy Scale: Poor  Pertinent Vitals/Pain Pain Assessment: 0-10 Pain Score: 8  Pain Location: L foot, especially in dependent position Pain Descriptors / Indicators: Aching;Throbbing(Intense) Pain Intervention(s): Repositioned;Other (comment)(Elevated L foot and leg)    Home Living Family/patient expects to be discharged to:: Private residence Living Arrangements: Other relatives Available Help at Discharge: Family;Available PRN/intermittently(Sister and  neice) Type of Home: House Home Access: Stairs to enter   [employer] of Steps: 1(one step from garage) Home Layout: One level Home Equipment: (His WC is 52 years old and dilapidated; unclear of other home equipment -- will need more info)      Prior Function Level of Independence: Independent with assistive device(s)(and unconventional strategies for managing )         Comments: Tells PT/OT that he typically crawls in his house to get around; He describes being able to use side of tub to help push self up from what sounds like floor>tubside>toilet; has an old, beat-up wheelchair, but tells [place] he is not allowed to use in his home per the landlord; initially in session, [name] indicated he is familiar with the use of crutches (though he did not give specifics of when he needed crutches); [name] was not entirely forthcoming with why he has been crawling to get around in his home; uses SCAT to get to/from HD     Hand Dominance        Extremity/Trunk Assessment   Upper Extremity Assessment Upper Extremity Assessment: Defer to OT evaluation    Lower Extremity Assessment Lower Extremity Assessment: Generalized weakness;LLE deficits/detail LLE Deficits / Details: L foot wrapped postop; Very painful LLE/foot with movement; Able to straight leg raise against gravity LLE: Unable to fully assess due to pain       Communication   Communication: No difficulties  Cognition Arousal/Alertness: Awake/alert Behavior During Therapy: WFL for tasks assessed/performed Overall Cognitive Status: Within Functional Limits for tasks assessed                                 General Comments: Not fully forthcoming with details of prior functional status      General Comments      Exercises     Assessment/Plan    PT Assessment Patient needs continued PT services  PT Problem List Decreased strength;Decreased range of motion;Decreased activity tolerance;Decreased  balance;Decreased mobility;Decreased coordination;Decreased knowledge of use of DME;Decreased safety awareness;Decreased knowledge of precautions;Pain       PT Treatment Interventions DME instruction;Gait training;Stair training;Functional mobility training;Therapeutic activities;Therapeutic exercise;Balance training;Patient/family education;Wheelchair mobility training    PT Goals (Current goals can be found in the Care Plan section)  Acute Rehab PT Goals Patient Stated Goal: did not state; indicated he has a lot on his mind PT Goal Formulation: With patient Time For Goal Achievement: 12/26/17 Potential to Achieve Goals: Good Additional Goals Additional Goal #1: Pt will independently manage wheelchair parts ( legrests, armrests, brakes, etc), and propulsion at least 600 feet, including turns and backwards    Frequency Min 3X/week   Barriers to discharge Other (comment) [name] indicates his landlord will not allow wheelchairs in the home -- but wheelchair is his best (and only) option for safe mobility and ADLs keeping NWB LLE    Co-evaluation PT/OT/SLP Co-Evaluation/Treatment: Yes Reason for Co-Treatment: For patient/therapist safety;To address functional/ADL transfers;Other (comment)(patient-centered decision based on activity tol, participati) PT goals addressed during session: Mobility/safety with mobility OT goals addressed during session: ADL's and self-care  AM-PAC PT "6 Clicks" Mobility  Outcome Measure Help needed turning from your back to your side while in a flat bed without using bedrails?: None Help needed moving from lying on your back to sitting on the side of a flat bed without using bedrails?: None Help needed moving to and from a bed to a chair (including a wheelchair)?: A Lot Help needed standing up from a chair using your arms (e.g., wheelchair or bedside chair)?: A Lot Help needed to walk in hospital room?: Total Help needed climbing 3-5 steps with a  railing? : Total 6 Click Score: 14    End of Session   Activity Tolerance: Patient limited by pain Patient left: in bed;with call bell/phone within reach;Other (comment)(LLE elevated)   PT Visit Diagnosis: Unsteadiness on feet (R26.81);Other abnormalities of gait and mobility (R26.89);Muscle weakness (generalized) (M62.81);Pain Pain - Right/Left: Left Pain - part of body: Ankle and joints of foot    Time: 1008-1040 PT Time Calculation (min) (ACUTE ONLY): 32 min   Charges:   PT Evaluation $PT Eval Moderate Complexity: [place], [place] Pager [phone] Office [place] 12/12/2017, 1:14 PM

## 2017-12-12 NOTE — Progress Notes (Signed)
[facility] Quick Nephrology Note:  By some miracle, his tunneled HD cath is running good today with BFR 350 for entire treatment so far (3 of 4 hours completed at time of this note). Left message with vascular surgery RN to communicate with Dr. Donzetta Matters that [facility] cath exchange may not be needed for tomorrow. Given limited access options, the less is more option is much preferred!  Discussed with patient and he agrees that only wants exchange if 100% needed.  [name], PA-C [employer] Pager [phone]

## 2017-12-12 NOTE — Progress Notes (Signed)
VASCULAR SURGERY:  He had a tunneled dialysis catheter placed through a collateral vein in his right groin.  Most recently on 09/20/2017 his catheter was not working well and he underwent attempted placement of a new catheter but the true lumen could not be entered on either side.  Currently the issue is his catheter is having low flow rates.  We were consult to consider changing this over a wire.  I have discussed this with Dr. Servando Snare and given that his only remaining option would be placement of a IVC catheter via a lumbar approach he does feel that it would be worth 1 more attempt at exchanging the catheter on the right side.  This has been scheduled for tomorrow.  I have discussed this with nephrology.  If this is not successful then he would require placement of an IVC catheter via a lumbar approach.  Deitra Mayo, MD, [place] [phone] Office: [phone]

## 2017-12-12 NOTE — Progress Notes (Signed)
PROGRESS NOTE    [name]  RCV:[id number] DOB: 01/14/1965 DOA: 12/07/2017 PCP: [name], MD   Brief Narrative:  HPI on 12/07/2017 by Dr. Merton Border [name]  is a 52 y.o. male, with past medical history significant for end-stage renal disease on hemodialysis who presented with 1 week history of not feeling well with the fever of 102.  Patient denies any chest pains or shortness of breath.  Patient reports a chronic cough.  Is complaining of left great toe pain and redness that has been getting worse.  He was in dialysis when he was found to be febrile at 101.5 and he was started on vancomycin and Fortaz.  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 In the emergency room patient was noted to be afebrile however his blood pressure was low.  His white blood cell count was 8.1 with a chest x-ray that did not show any acute changes and left foot x-ray that did not show any osteomyelitis.  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  Small first IP joint effusion with marrow edema on either side of the joint concerning for septic arthritis and osteomyelitis. -Orthopedic surgery consulted and appreciated, s/p left first great toe amputation -Infectious disease consulted and appreciated-recommended TEE.  Ending results of TEE, would at least treat with 4 weeks of IV antibiotics -Patient was placed on vancomycin and ceftazidime, and has been transitioned to cefazolin -Echocardiogram was unremarkable for vegetation -PT recommended home health with wheelchair and rolling walker  Right flank pain tenderness -Status post right nephrostomy tube in the  past -CT abdomen pelvis without acute findings -Right flank pain improving  ESRD on hemodialysis -Nephrology consulted and appreciated -Access issues, patient had difficulties with blood flow -Vascular surgery consulted and appreciated, plan for attempted [facility] exchange on 12/13/2017  Hyperkalemia -Patient given lokelma -Continue to monitor BMP  Anemia of chronic disease -Patient will receive Aranesp today -Hemoglobin currently 9.4 -Continue to monitor CBC  Hypothyroidism -Continue Synthroid  Chronic hypotension -Continue midodrine  PAD -Continue Plavix  Anxiety -Continue Ativan  DVT Prophylaxis  heparin  Code Status: Full  Family Communication: None at bedside  Disposition Plan: Admitted. Suspect home when stable. Currently pending vascular surgery intervention for HD access, TEE, and further ID recommendations.  Consultants Orthopedic surgery ID Nephrology Vascular surgery  Procedures  Left great toe amputation  Antibiotics   Anti-infectives (From admission, onward)   Start     Dose/Rate Route Frequency Ordered Stop   12/14/17 2200  ceFAZolin (ANCEF) IVPB 1 g/50 mL premix     1 g 100 mL/hr over 30 Minutes Intravenous Every 24 hours 12/12/17 1055     12/13/17 1500  ceFAZolin (ANCEF) IVPB 2g/100 mL premix  Status:  Discontinued    Note to Pharmacy:  Send with pt to OR   2 g 200 mL/hr over 30 Minutes Intravenous To Short Stay 12/12/17 0755 12/12/17 1053   12/13/17 1500  ceFAZolin (ANCEF) IVPB 1 g/50 mL premix    Note to Pharmacy:  Send with pt to OR   1 g 100 mL/hr over 30 Minutes Intravenous To ShortStay Surgical 12/12/17 1021 12/14/17 1500  12/12/17 2200  ceFAZolin (ANCEF) IVPB 1 g/50 mL premix     1 g 100 mL/hr over 30 Minutes Intravenous Every 24 hours 12/11/17 1344 12/12/17 2359   12/11/17 0800  ceFAZolin (ANCEF) 3 g in dextrose 5 % 50 mL IVPB     3 g 100 mL/hr over 30 Minutes Intravenous To ShortStay Surgical 12/10/17 1508 12/11/17 0937    12/10/17 2000  cefTAZidime (FORTAZ) 2 g in sodium chloride 0.9 % 100 mL IVPB  Status:  Discontinued     2 g 200 mL/hr over 30 Minutes Intravenous Every M-W-F (2000) 12/07/17 2218 12/08/17 1029   12/10/17 1200  vancomycin (VANCOCIN) IVPB 1000 mg/200 mL premix  Status:  Discontinued     1,000 mg 200 mL/hr over 60 Minutes Intravenous Every M-W-F (Hemodialysis) 12/07/17 2218 12/08/17 1029   12/08/17 2200  ceFAZolin (ANCEF) IVPB 1 g/50 mL premix  Status:  Discontinued     1 g 100 mL/hr over 30 Minutes Intravenous Every 24 hours 12/08/17 1250 12/11/17 1344   12/08/17 2000  cefTAZidime (FORTAZ) 2 g in sodium chloride 0.9 % 100 mL IVPB  Status:  Discontinued     2 g 200 mL/hr over 30 Minutes Intravenous  Once 12/07/17 2218 12/08/17 1157   12/08/17 1200  vancomycin (VANCOCIN) IVPB 1000 mg/200 mL premix  Status:  Discontinued     1,000 mg 200 mL/hr over 60 Minutes Intravenous Every T-Th-Sa (Hemodialysis) 12/07/17 2218 12/08/17 1157   12/08/17 1100  ceFAZolin (ANCEF) IVPB 1 g/50 mL premix  Status:  Discontinued     1 g 100 mL/hr over 30 Minutes Intravenous Every 24 hours 12/08/17 1029 12/08/17 1250   12/08/17 0000  vancomycin (VANCOCIN) IVPB 1000 mg/200 mL premix  Status:  Discontinued     1,000 mg 200 mL/hr over 60 Minutes Intravenous Every M-W-F (Hemodialysis) 12/07/17 2216 12/07/17 2218   12/08/17 0000  cefTAZidime (FORTAZ) 2 g in sodium chloride 0.9 % 100 mL IVPB  Status:  Discontinued     2 g 200 mL/hr over 30 Minutes Intravenous Every M-W-F (2000) 12/07/17 2216 12/07/17 2218   12/07/17 2215  cefTAZidime (FORTAZ) 2 g in sodium chloride 0.9 % 100 mL IVPB  Status:  Discontinued     2 g 200 mL/hr over 30 Minutes Intravenous Every M-W-F (2000) 12/07/17 2209 12/07/17 2216      Subjective:   [name] seen and examined today.  Patient continues to have right foot pain.  Denies current chest pain, shortness breath, abdominal pain, nausea or vomiting, diarrhea constipation, dizziness or  headache.  Objective:   Vitals:   12/12/17 1119 12/12/17 1130 12/12/17 1200 12/12/17 1230  BP: 111/72 122/64 (!) 111/57 115/62  Pulse: 70 69 65 63  Resp:      Temp:      TempSrc:      SpO2:      Weight:      Height:        Intake/Output Summary (Last 24 hours) at 12/12/2017 1334 Last data filed at 12/12/2017 0600 Gross per 24 hour  Intake 442.33 ml  Output 873 ml  Net -430.67 ml   Filed Weights   12/11/17 1530 12/11/17 2153 12/12/17 1111  Weight: 124.8 kg 125.1 kg 125 kg    Exam  General: Well developed, well nourished, NAD, appears stated age  73: NCAT, mucous membranes moist.   Neck: Supple  Cardiovascular: S1 S2 auscultated, RRR, 3/6 SEM  Respiratory: Clear to auscultation bilaterally with equal chest rise  Abdomen:  Soft, nontender, nondistended, + bowel sounds  Extremities: warm dry without cyanosis clubbing or edema. Dressing on right foot.  Neuro: AAOx3, nonfocal  Psych: Normal affect and demeanor with intact judgement and insight   Data Reviewed: I have personally reviewed following labs and imaging studies  CBC: Recent Labs  Lab 12/07/17 1115 12/08/17 0636 12/09/17 0541 12/10/17 0431 12/11/17 0433 12/12/17 0536  WBC 8.1 5.2 4.7 4.9 4.9 4.6  NEUTROABS 7.4  --   --   --   --   --   HGB 10.9* 10.0* 10.3* 9.7* 9.8* 9.4*  HCT 36.0* 32.4* 34.9* 32.5* 32.6* 31.2*  MCV 96.3 95.0 95.9 95.3 94.8 95.7  PLT 92* 91* 106* 128* 136* 932*   Basic Metabolic Panel: Recent Labs  Lab 12/08/17 0636 12/09/17 0541 12/09/17 1241 12/10/17 0431 12/11/17 0433 12/12/17 0536  NA 136 136  --  135 137 134*  K 4.8 4.5  --  5.0 5.9* 6.5*  CL 93* 95*  --  95* 95* 96*  CO2 27 27  --  27 27 21*  GLUCOSE 100* 111*  --  90 81 74  BUN 56* 44*  --  67* 85* 92*  CREATININE 10.99* 8.78*  --  11.18* 12.92* 13.41*  CALCIUM 8.0* 8.5*  --  8.7* 9.3 8.4*  PHOS  --   --  4.2  --   --   --    GFR: Estimated Creatinine Clearance: 8.7 mL/min (A) (by C-G formula based on  SCr of 13.41 mg/dL (H)). Liver Function Tests: Recent Labs  Lab 12/07/17 1115  AST 19  ALT 14  ALKPHOS 56  BILITOT 1.1  PROT 8.3*  ALBUMIN 3.0*   No results for input(s): LIPASE, AMYLASE in the last 168 hours. No results for input(s): AMMONIA in the last 168 hours. Coagulation Profile: Recent Labs  Lab 12/07/17 1115  INR 1.15   Cardiac Enzymes: Recent Labs  Lab 12/07/17 1243  TROPONINI 0.04*   BNP (last 3 results) No results for input(s): PROBNP in the last 8760 hours. HbA1C: No results for input(s): HGBA1C in the last 72 hours. CBG: No results for input(s): GLUCAP in the last 168 hours. Lipid Profile: No results for input(s): CHOL, HDL, LDLCALC, TRIG, CHOLHDL, LDLDIRECT in the last 72 hours. Thyroid Function Tests: No results for input(s): TSH, T4TOTAL, FREET4, T3FREE, THYROIDAB in the last 72 hours. Anemia Panel: No results for input(s): VITAMINB12, FOLATE, FERRITIN, TIBC, IRON, RETICCTPCT in the last 72 hours. Urine analysis:    Component Value Date/Time   COLORURINE RED (A) 07/31/2010 0410   APPEARANCEUR TURBID (A) 07/31/2010 0410   LABSPEC 1.044 (H) 07/31/2010 0410   PHURINE 6.5 07/31/2010 0410   GLUCOSEU 100 (A) 07/31/2010 0410   HGBUR LARGE (A) 07/31/2010 0410   BILIRUBINUR LARGE (A) 07/31/2010 0410   KETONESUR 40 (A) 07/31/2010 0410   PROTEINUR >300 (A) 07/31/2010 0410   UROBILINOGEN 4.0 (H) 07/31/2010 0410   NITRITE POSITIVE (A) 07/31/2010 0410   LEUKOCYTESUR LARGE (A) 07/31/2010 0410   Sepsis Labs: @LABRCNTIP (procalcitonin:4,lacticidven:4)  ) Recent Results (from the past 240 hour(s))  Culture, blood (Routine x 2)     Status: Abnormal   Collection Time: 12/07/17 11:15 AM  Result Value Ref Range Status   Specimen Description BLOOD RIGHT ANTECUBITAL  Final   Special Requests   Final    BOTTLES DRAWN AEROBIC AND ANAEROBIC Blood Culture adequate volume   Culture  Setup Time   Final    GRAM POSITIVE COCCI IN CLUSTERS  IN BOTH AEROBIC AND  ANAEROBIC BOTTLES CRITICAL RESULT CALLED TO, READ BACK BY AND VERIFIED WITH: [place], [place] [id number] FCP Performed at [facility], [place] [address] [place], [place] [address]    Culture STAPHYLOCOCCUS AUREUS (A)  Final   Report Status 12/10/2017 FINAL  Final   Organism ID, Bacteria STAPHYLOCOCCUS AUREUS  Final      Susceptibility   Staphylococcus aureus - MIC*    CIPROFLOXACIN <=0.5 SENSITIVE Sensitive     ERYTHROMYCIN <=0.25 SENSITIVE Sensitive     GENTAMICIN <=0.5 SENSITIVE Sensitive     OXACILLIN 0.5 SENSITIVE Sensitive     TETRACYCLINE <=1 SENSITIVE Sensitive     VANCOMYCIN <=0.5 SENSITIVE Sensitive     TRIMETH/SULFA <=10 SENSITIVE Sensitive     CLINDAMYCIN <=0.25 SENSITIVE Sensitive     RIFAMPIN <=0.5 SENSITIVE Sensitive     Inducible Clindamycin NEGATIVE Sensitive     * STAPHYLOCOCCUS AUREUS  Blood Culture ID Panel (Reflexed)     Status: Abnormal   Collection Time: 12/07/17 11:15 AM  Result Value Ref Range Status   Enterococcus species NOT DETECTED NOT DETECTED Final   Listeria monocytogenes NOT DETECTED NOT DETECTED Final   Staphylococcus species DETECTED (A) NOT DETECTED Final    Comment: CRITICAL RESULT CALLED TO, READ BACK BY AND VERIFIED WITH: PHARMD ESPADA, M [phone] FCP    Staphylococcus aureus (BCID) DETECTED (A) NOT DETECTED Final    Comment: Methicillin (oxacillin) susceptible Staphylococcus aureus (MSSA). Preferred therapy is anti staphylococcal beta lactam antibiotic (Cefazolin or Nafcillin), unless clinically contraindicated. CRITICAL RESULT CALLED TO, READ BACK BY AND VERIFIED WITH: PHARMD ESPADA, M [phone] [id number] FCP    Methicillin resistance NOT DETECTED NOT DETECTED Final   Streptococcus species NOT DETECTED NOT DETECTED Final   Streptococcus agalactiae NOT DETECTED NOT DETECTED Final   Streptococcus pneumoniae NOT DETECTED NOT DETECTED Final   Streptococcus pyogenes NOT DETECTED NOT DETECTED Final   Acinetobacter baumannii NOT DETECTED NOT  DETECTED Final   Enterobacteriaceae species NOT DETECTED NOT DETECTED Final   Enterobacter cloacae complex NOT DETECTED NOT DETECTED Final   Escherichia coli NOT DETECTED NOT DETECTED Final   Klebsiella oxytoca NOT DETECTED NOT DETECTED Final   Klebsiella pneumoniae NOT DETECTED NOT DETECTED Final   Proteus species NOT DETECTED NOT DETECTED Final   Serratia marcescens NOT DETECTED NOT DETECTED Final   Haemophilus influenzae NOT DETECTED NOT DETECTED Final   Neisseria meningitidis NOT DETECTED NOT DETECTED Final   Pseudomonas aeruginosa NOT DETECTED NOT DETECTED Final   Candida albicans NOT DETECTED NOT DETECTED Final   Candida glabrata NOT DETECTED NOT DETECTED Final   Candida krusei NOT DETECTED NOT DETECTED Final   Candida parapsilosis NOT DETECTED NOT DETECTED Final   Candida tropicalis NOT DETECTED NOT DETECTED Final    Comment: Performed at [facility], [address] [place], [place] [address]  Culture, blood (Routine x 2)     Status: Abnormal   Collection Time: 12/07/17 11:17 AM  Result Value Ref Range Status   Specimen Description BLOOD BLOOD LEFT ARM  Final   Special Requests   Final    BOTTLES DRAWN AEROBIC AND ANAEROBIC Blood Culture adequate volume   Culture  Setup Time   Final    GRAM POSITIVE COCCI IN CLUSTERS ANAEROBIC BOTTLE ONLY CRITICAL VALUE NOTED.  VALUE IS CONSISTENT WITH PREVIOUSLY REPORTED AND CALLED VALUE.    Culture (A)  Final    STAPHYLOCOCCUS AUREUS SUSCEPTIBILITIES PERFORMED ON PREVIOUS CULTURE WITHIN THE LAST 5  DAYS. Performed at [facility], [place] [address], [place], [place] [address]    Report Status 12/10/2017 FINAL  Final  MRSA PCR Screening     Status: None   Collection Time: 12/08/17 12:05 AM  Result Value Ref Range Status   MRSA by PCR NEGATIVE NEGATIVE Final    Comment:        The GeneXpert MRSA Assay (FDA approved for NASAL specimens only), is one component of a comprehensive MRSA colonization surveillance program. It is  not intended to diagnose MRSA infection nor to guide or monitor treatment for MRSA infections. Performed at [facility], [place] [address], [place], [place] [address]       Radiology Studies: No results found.   Scheduled Meds: . calcium acetate  2,668 mg Oral TID WC  . Chlorhexidine Gluconate Cloth  6 each Topical Q0600  . clopidogrel  75 mg Oral Daily  . darbepoetin (ARANESP) injection - DIALYSIS  40 mcg Intravenous Q Wed-HD  . docusate sodium  100 mg Oral BID  . doxercalciferol  6 mcg Intravenous Q M,W,F-HD  . famotidine  20 mg Oral QHS  . heparin  5,000 Units Subcutaneous Q8H  .  HYDROmorphone (DILAUDID) injection  1-2 mg Intravenous Once  . levothyroxine  125 mcg Oral QAC breakfast  . midodrine  5 mg Oral TID WC  . sodium chloride flush  3 mL Intravenous Q12H  . traZODone  100 mg Oral QHS  . zolpidem  10 mg Oral QHS   Continuous Infusions: . sodium chloride Stopped (12/08/17 1326)  . sodium chloride    . sodium chloride    . sodium chloride 10 mL/hr at 12/11/17 0825  . sodium chloride 10 mL/hr at 12/11/17 1216  .  ceFAZolin (ANCEF) IV    . [START ON 12/13/2017]  ceFAZolin (ANCEF) IV    . [START ON 12/14/2017]  ceFAZolin (ANCEF) IV    . methocarbamol (ROBAXIN) IV       LOS: 5 days   Time Spent in minutes   30 minutes  Sharese Manrique D.O. on 12/12/2017 at 1:34 PM  Between 7am to 7pm - Please see pager noted on [email or web address]  After 7pm go to [email or web address]  And look for the night coverage person covering for me after hours  [facility] Office  [phone]

## 2017-12-12 NOTE — Progress Notes (Addendum)
[name] Progress Note   Subjective:  Seen in room. No CP/dyspnea. K high today (not surprising given poor HD recently d/t access issues), will be coming for HD later today. 1 dose Lokelma now and will use 1K bath with HD.  Objective Vitals:   12/11/17 1530 12/11/17 1647 12/11/17 2153 12/12/17 0426  BP: 121/66 125/74 (!) 103/58 (!) 108/57  Pulse: 70 68 65 65  Resp: 19 19 16 15   Temp: 98.7 F (37.1 C) 98.8 F (37.1 C) 98.7 F (37.1 C) 98 F (36.7 C)  TempSrc: Oral Oral Oral Oral  SpO2: 98% 100% 98% 95%  Weight: 124.8 kg  125.1 kg   Height:       Physical Exam General:Well appearing, NAD. Heart:RRR; 3/y systolic murmur (heard best on L side) Lungs:CTAB Abdomen:soft, non-tender Extremities:L foot bandaged, trace LE edema Dialysis Access:TDC in R femoral/thigh  Additional Objective Labs: Basic Metabolic Panel: Recent Labs  Lab 12/09/17 1241 12/10/17 0431 12/11/17 0433 12/12/17 0536  NA  --  135 137 134*  K  --  5.0 5.9* 6.5*  CL  --  95* 95* 96*  CO2  --  27 27 21*  GLUCOSE  --  90 81 74  BUN  --  67* 85* 92*  CREATININE  --  11.18* 12.92* 13.41*  CALCIUM  --  8.7* 9.3 8.4*  PHOS 4.2  --   --   --    Liver Function Tests: Recent Labs  Lab 12/07/17 1115  AST 19  ALT 14  ALKPHOS 56  BILITOT 1.1  PROT 8.3*  ALBUMIN 3.0*   CBC: Recent Labs  Lab 12/07/17 1115 12/08/17 0636 12/09/17 0541 12/10/17 0431 12/11/17 0433 12/12/17 0536  WBC 8.1 5.2 4.7 4.9 4.9 4.6  NEUTROABS 7.4  --   --   --   --   --   HGB 10.9* 10.0* 10.3* 9.7* 9.8* 9.4*  HCT 36.0* 32.4* 34.9* 32.5* 32.6* 31.2*  MCV 96.3 95.0 95.9 95.3 94.8 95.7  PLT 92* 91* 106* 128* 136* 126*   Blood Culture    Component Value Date/Time   SDES BLOOD BLOOD LEFT ARM 12/07/2017 1117   SPECREQUEST  12/07/2017 1117    BOTTLES DRAWN AEROBIC AND ANAEROBIC Blood Culture adequate volume   CULT (A) 12/07/2017 1117    STAPHYLOCOCCUS AUREUS SUSCEPTIBILITIES PERFORMED ON PREVIOUS  CULTURE WITHIN THE LAST 5 DAYS. Performed at [facility], [place] [address] [place], [place] [address]    REPTSTATUS 12/10/2017 FINAL 12/07/2017 1117   Medications: . sodium chloride Stopped (12/08/17 1326)  . sodium chloride    . sodium chloride    . sodium chloride 10 mL/hr at 12/11/17 0825  . sodium chloride 10 mL/hr at 12/11/17 1216  .  ceFAZolin (ANCEF) IV    . [START ON 12/13/2017]  ceFAZolin (ANCEF) IV    . methocarbamol (ROBAXIN) IV     . calcium acetate  2,668 mg Oral TID WC  . Chlorhexidine Gluconate Cloth  6 each Topical Q0600  . clopidogrel  75 mg Oral Daily  . darbepoetin (ARANESP) injection - DIALYSIS  25 mcg Intravenous Q Mon-HD  . docusate sodium  100 mg Oral BID  . doxercalciferol  6 mcg Intravenous Q M,W,F-HD  . famotidine  20 mg Oral QHS  . heparin  5,000 Units Subcutaneous Q8H  .  HYDROmorphone (DILAUDID) injection  1-2 mg Intravenous Once  . levothyroxine  125 mcg Oral QAC breakfast  . midodrine  5 mg Oral  TID WC  . sodium chloride flush  3 mL Intravenous Q12H  . traZODone  100 mg Oral QHS  . zolpidem  10 mg Oral QHS    Dialysis Orders: MWF -AF 4.5h 450/800 122.5kg 2/2.25 bath R thigh TDC Hep 3700  -Parsabiv 5mg  qHD -Hectorol38mcg IV qHD  Assessment/Plan: 1. MSSABacteremia: Presumed due toL foot osteomyelitis.On Cefazolin per pharm.TDC does not appear infected. As access options are extremely limited, plan was to try to clear infection without having to remove cath. ID consulting, TTE negative for IE. Access issue, see below. 2. Foot Ulcer/LLE cellulitis: Osteomyelitisof L great toe, now s/p amputation 12/10. R flank wound near old nephrostomy site - CTabd showed no signs of infection. 3. ESRD: Continue HD per MWF schedule. Difficulties with blood flows in TDCwith last HD, s/p multiple tPA dwell without much improvement. VVS consulted, plan is for attempted [facility] exchange on 12/12 with Dr. Donzetta Matters. If unsuccessful, translumbar catheter  may be his last access option. HyperK today, Lokelma x 1, 1K bath with HD today. 4. Hypotension/volume: BP chronically low, on midodrine for BP support. Follow. 5. Anemiaof CKD: Hgb 9.4, no ESA as outpatient. Small dose (Aranesp 40) to be given 12/11. 6. Secondary Hyperparathyroidism: CorrCa/Phos ok.Continue VDRA and binders. Unable to get parsabiv because not available in hospital 7. Nutrition -Currently on regular diet. Follow labs.Renavite.  8. Hx seizures - per primary 9. GERD   [name], Hershal Coria 12/12/2017, 9:56 AM  [facility] Pager: [phone]  Pt seen, examined and agree w A/P as above.  [name] MD [employer] pager [phone]   12/12/2017, 12:20 PM

## 2017-12-12 NOTE — Progress Notes (Signed)
Orthopedic Tech Progress Note Patient Details:  [name] 08/06/1965 [id number]  Ortho Devices Type of Ortho Device: Postop shoe/boot Ortho Device/Splint Location: lle Ortho Device/Splint Interventions: Ordered, Application, Adjustment   Post Interventions Patient Tolerated: Well Instructions Provided: Care of device, Adjustment of device   Karolee Stamps 12/12/2017, 10:53 AM

## 2017-12-12 NOTE — Progress Notes (Signed)
CRITICAL VALUE ALERT  Critical value received:  K = 6.5  Date of notification:  12/12/2017  Time of notification:  0730  Critical value read back:Yes.    Nurse who received alert:  K. Laurance Flatten  MD notified (1st page):  Dr. Ree Kida  Time of first page:  [phone]

## 2017-12-12 NOTE — Progress Notes (Signed)
Physical Therapy Note  Patient suffers from L great toe osteomyelitis, now s/p Great Toe amputation, which impairs their ability to perform daily activities like ambulation, transfer, general mobility, bathing in the home.  A walker alone will not resolve the issues with performing activities of daily living. A lightweight wheelchair will allow patient to safely perform daily activities.  The patient can self propel in the home or has a caregiver who can provide assistance.     Roney [place], [place]  Acute Rehabilitation Services Pager [phone] Office [phone]

## 2017-12-12 NOTE — Progress Notes (Signed)
[facility] for Infectious Disease  Date of Admission:  12/07/2017     Total days of antibiotics 5         ASSESSMENT/PLAN  Mr. Enfield continues to receive treatment for MSSA bacteremia with most likely source being osteomyelitis of the left great toe and now s/p amputation on 12/10. On Day 5 of antimicrobial therapy with Cefazolin and tolerating without adverse side effects. TTE was negative for vegetation and will need TEE. Vascular surgery is attempting to replace his current dialysis access located in his right femoral. This would be ideal given there is a likelihood that the catheter may be seeded from the bacteremia. Repeat cultures were ordered today. He will need prolonged therapy with Cefazolin with dialysis for at least 4 weeks and possible 6 pending TEE results.   1. Continue current dose of Cefazolin. 2. Monitor repeat cultures for clearance of bacteremia 3. Will order TEE to rule out endocarditis.  4. Appreciate vascular team attempting to change catheter.   Principal Problem:   MSSA bacteremia Active Problems:   End stage renal disease ([place])   Fever   Right flank pain   Osteomyelitis of great toe of left foot ([place])   . calcium acetate  2,668 mg Oral TID WC  . Chlorhexidine Gluconate Cloth  6 each Topical Q0600  . clopidogrel  75 mg Oral Daily  . darbepoetin (ARANESP) injection - DIALYSIS  40 mcg Intravenous Q Wed-HD  . docusate sodium  100 mg Oral BID  . doxercalciferol  6 mcg Intravenous Q M,W,F-HD  . famotidine  20 mg Oral QHS  . heparin  5,000 Units Subcutaneous Q8H  .  HYDROmorphone (DILAUDID) injection  1-2 mg Intravenous Once  . levothyroxine  125 mcg Oral QAC breakfast  . midodrine  5 mg Oral TID WC  . sodium chloride flush  3 mL Intravenous Q12H  . traZODone  100 mg Oral QHS  . zolpidem  10 mg Oral QHS    SUBJECTIVE:  Afebrile overnight and now POD #1 from left great toe amputation which was without complication. Having right flank pain today  in the area of previous nephrostomy tubes.   Allergies  Allergen Reactions  . Furadantin [Nitrofurantoin] Other (See Comments)    UNSPECIFIED REACTION   . Mandelamine [Methenamine] Other (See Comments)    UNSPECIFIED REACTION   . Noroxin [Norfloxacin] Other (See Comments)    UNSPECIFIED REACTION   . Carmine Nausea Only  . Contrast Media [Iodinated Diagnostic Agents] Nausea And Vomiting    Oral dye causes vomiting, IV dye is okay  . Hydrocodone Other (See Comments)    LIKELY NOT REACTION TO HYDROCODONE Pt states that after 3 weeks of taking this medication he will began to "twitch"  . Metrizamide Nausea And Vomiting    Oral dye causes vomiting, IV dye is okay  . Sulfa Antibiotics Cough    Childhood reaction - pt could not confirm that it was a cough  . Sulfur Cough    Childhood reaction - pt could not confirm that it was a cough     Review of Systems: Review of Systems  Constitutional: Negative for chills, fever and malaise/fatigue.  Respiratory: Negative for cough, shortness of breath and wheezing.   Cardiovascular: Negative for chest pain.  Gastrointestinal: Negative for abdominal pain, diarrhea, nausea and vomiting.  Skin: Negative for rash.  Neurological: Negative for weakness and headaches.    OBJECTIVE: Vitals:   12/11/17 1647 12/11/17 2153 12/12/17 0426 12/12/17 1043  BP: 125/74 (!) 103/58 (!) 108/57 128/76  Pulse: 68 65 65 71  Resp: 19 16 15 18   Temp: 98.8 F (37.1 C) 98.7 F (37.1 C) 98 F (36.7 C) 99 F (37.2 C)  TempSrc: Oral Oral Oral Oral  SpO2: 100% 98% 95% 98%  Weight:  125.1 kg    Height:       Body mass index is 38.47 kg/m.  Physical Exam  Constitutional: He appears well-developed and well-nourished. No distress.  Lying in bed with head of bed elevated; somber.   Cardiovascular: Normal rate, regular rhythm and normal heart sounds. Exam reveals no gallop and no friction rub.  No murmur heard. Pulmonary/Chest: Effort normal and breath sounds  normal.  Musculoskeletal:  Right lower extremity bandage is clean and dry.   Neurological: He is alert.  Skin: Skin is warm and dry.    Lab Results Lab Results  Component Value Date   WBC 4.6 12/12/2017   HGB 9.4 (L) 12/12/2017   HCT 31.2 (L) 12/12/2017   MCV 95.7 12/12/2017   PLT 126 (L) 12/12/2017    Lab Results  Component Value Date   CREATININE 13.41 (H) 12/12/2017   BUN 92 (H) 12/12/2017   NA 134 (L) 12/12/2017   K 6.5 (HH) 12/12/2017   CL 96 (L) 12/12/2017   CO2 21 (L) 12/12/2017    Lab Results  Component Value Date   ALT 14 12/07/2017   AST 19 12/07/2017   ALKPHOS 56 12/07/2017   BILITOT 1.1 12/07/2017     Microbiology: Recent Results (from the past 240 hour(s))  Culture, blood (Routine x 2)     Status: Abnormal   Collection Time: 12/07/17 11:15 AM  Result Value Ref Range Status   Specimen Description BLOOD RIGHT ANTECUBITAL  Final   Special Requests   Final    BOTTLES DRAWN AEROBIC AND ANAEROBIC Blood Culture adequate volume   Culture  Setup Time   Final    GRAM POSITIVE COCCI IN CLUSTERS IN BOTH AEROBIC AND ANAEROBIC BOTTLES CRITICAL RESULT CALLED TO, READ BACK BY AND VERIFIED WITH: [place], [place] [id number] FCP Performed at [facility], [place] [address], [place], [place] [address]    Culture STAPHYLOCOCCUS AUREUS (A)  Final   Report Status 12/10/2017 FINAL  Final   Organism ID, Bacteria STAPHYLOCOCCUS AUREUS  Final      Susceptibility   Staphylococcus aureus - MIC*    CIPROFLOXACIN <=0.5 SENSITIVE Sensitive     ERYTHROMYCIN <=0.25 SENSITIVE Sensitive     GENTAMICIN <=0.5 SENSITIVE Sensitive     OXACILLIN 0.5 SENSITIVE Sensitive     TETRACYCLINE <=1 SENSITIVE Sensitive     VANCOMYCIN <=0.5 SENSITIVE Sensitive     TRIMETH/SULFA <=10 SENSITIVE Sensitive     CLINDAMYCIN <=0.25 SENSITIVE Sensitive     RIFAMPIN <=0.5 SENSITIVE Sensitive     Inducible Clindamycin NEGATIVE Sensitive     * STAPHYLOCOCCUS AUREUS  Blood Culture ID Panel (Reflexed)      Status: Abnormal   Collection Time: 12/07/17 11:15 AM  Result Value Ref Range Status   Enterococcus species NOT DETECTED NOT DETECTED Final   Listeria monocytogenes NOT DETECTED NOT DETECTED Final   Staphylococcus species DETECTED (A) NOT DETECTED Final    Comment: CRITICAL RESULT CALLED TO, READ BACK BY AND VERIFIED WITH: PHARMD ESPADA, M [phone] FCP    Staphylococcus aureus (BCID) DETECTED (A) NOT DETECTED Final    Comment: Methicillin (oxacillin) susceptible Staphylococcus aureus (MSSA). Preferred therapy is anti staphylococcal  beta lactam antibiotic (Cefazolin or Nafcillin), unless clinically contraindicated. CRITICAL RESULT CALLED TO, READ BACK BY AND VERIFIED WITH: PHARMD ESPADA, M [phone] [id number] FCP    Methicillin resistance NOT DETECTED NOT DETECTED Final   Streptococcus species NOT DETECTED NOT DETECTED Final   Streptococcus agalactiae NOT DETECTED NOT DETECTED Final   Streptococcus pneumoniae NOT DETECTED NOT DETECTED Final   Streptococcus pyogenes NOT DETECTED NOT DETECTED Final   Acinetobacter baumannii NOT DETECTED NOT DETECTED Final   Enterobacteriaceae species NOT DETECTED NOT DETECTED Final   Enterobacter cloacae complex NOT DETECTED NOT DETECTED Final   Escherichia coli NOT DETECTED NOT DETECTED Final   Klebsiella oxytoca NOT DETECTED NOT DETECTED Final   Klebsiella pneumoniae NOT DETECTED NOT DETECTED Final   Proteus species NOT DETECTED NOT DETECTED Final   Serratia marcescens NOT DETECTED NOT DETECTED Final   Haemophilus influenzae NOT DETECTED NOT DETECTED Final   Neisseria meningitidis NOT DETECTED NOT DETECTED Final   Pseudomonas aeruginosa NOT DETECTED NOT DETECTED Final   Candida albicans NOT DETECTED NOT DETECTED Final   Candida glabrata NOT DETECTED NOT DETECTED Final   Candida krusei NOT DETECTED NOT DETECTED Final   Candida parapsilosis NOT DETECTED NOT DETECTED Final   Candida tropicalis NOT DETECTED NOT DETECTED Final    Comment: Performed at  [facility], [address] [place], [place] [address]  Culture, blood (Routine x 2)     Status: Abnormal   Collection Time: 12/07/17 11:17 AM  Result Value Ref Range Status   Specimen Description BLOOD BLOOD LEFT ARM  Final   Special Requests   Final    BOTTLES DRAWN AEROBIC AND ANAEROBIC Blood Culture adequate volume   Culture  Setup Time   Final    GRAM POSITIVE COCCI IN CLUSTERS ANAEROBIC BOTTLE ONLY CRITICAL VALUE NOTED.  VALUE IS CONSISTENT WITH PREVIOUSLY REPORTED AND CALLED VALUE.    Culture (A)  Final    STAPHYLOCOCCUS AUREUS SUSCEPTIBILITIES PERFORMED ON PREVIOUS CULTURE WITHIN THE LAST 5 DAYS. Performed at [facility], [place] [address] [place], [place] [address]    Report Status 12/10/2017 FINAL  Final  MRSA PCR Screening     Status: None   Collection Time: 12/08/17 12:05 AM  Result Value Ref Range Status   MRSA by PCR NEGATIVE NEGATIVE Final    Comment:        The GeneXpert MRSA Assay (FDA approved for NASAL specimens only), is one component of a comprehensive MRSA colonization surveillance program. It is not intended to diagnose MRSA infection nor to guide or monitor treatment for MRSA infections. Performed at [facility], [place] [address] [place], [place] [address]      Terri Piedra, [place] [facility] [place] Pager  12/12/2017  10:49 AM

## 2017-12-12 NOTE — Evaluation (Signed)
Occupational Therapy Evaluation Patient Details Name: [name] MRN: [id number] DOB: 11/26/1965 Today's Date: 12/12/2017    History of Present Illness [name] is a 52 year old male with past medical history significant for end-stage renal disease on hemodialysis, peripheral artery disease, hypothyroidism, hypotension on midodrine, who presents with fevers and chills.  He admits to 1 week history of not feeling well, fever of 102 at home.  He has also been having left great toe pain and redness that has been getting worse in the past 2 weeks. L great toe osteomyelitis, now s/p amputation, TDWB; difficulty with HD access, plans for vascular procedure 12/12   Clinical Impression   PTA, pt was living with his sister and niece and performed ADLs independently using personalized compensatory techniques. Pt reports that he crawls around his home and uses tub to push up onto toilet; pt reports that his landlord won't let him "use his w/c on the tile floors" and "it is just easier this way". Pt requiring Min A for LB ADLs and functional transfers. Providing education on WB status and need for further therapy. Pt will require further acute OT to facilitate safe dc. Recommend dc to home with HHOT for further OT to optimize safety, independence with ADLs, and return to PLOF.       Follow Up Recommendations  Home health OT;Supervision/Assistance - 24 hour    Equipment Recommendations  Wheelchair (measurements OT);Wheelchair cushion (measurements OT);Other (comment)(Declining BSC)    Recommendations for Other Services PT consult     Precautions / Restrictions Precautions Precautions: Fall Precaution Comments: Pt reports long standing issues with balance Required Braces or Orthoses: Other Brace Other Brace: post-op shoe L foot      Mobility Bed Mobility Overal bed mobility: Needs Assistance Bed Mobility: Supine to Sit;Sit to Supine     Supine to sit: Min guard Sit to supine:  Min guard   General bed mobility comments: Not needing physical assist, but minguard for safety as he was quite impulsive  Transfers Overall transfer level: Needs assistance Equipment used: Rolling walker (2 wheeled) Transfers: Sit to/from Stand(and Modified stand-pivot transfer) Sit to Stand: Min assist;+2 safety/equipment;Mod assist         General transfer comment: Min assist overall to steady RW as he tended to pull up on it; [name] initiated transfer having R foot on the floor, and by turning body towards bed to prop L knee on bed in standing (much like on uses a knee scooter); was able to briefly let LLE off of the bed and do a modified pivot on R foot towards HOB, during this transfer, his hips and trunk were significantly flexed over RW; +2 for safety    Balance Overall balance assessment: Needs assistance Sitting-balance support: No upper extremity supported;Feet supported Sitting balance-Leahy Scale: Fair       Standing balance-Leahy Scale: Poor                             ADL either performed or assessed with clinical judgement   ADL Overall ADL's : Needs assistance/impaired Eating/Feeding: Independent;Sitting;Bed level   Grooming: Supervision/safety;Set up;Sitting   Upper Body Bathing: Set up;Supervision/ safety;Sitting   Lower Body Bathing: Minimal assistance;Sit to/from stand;+2 for safety/equipment Lower Body Bathing Details (indicate cue type and reason): Min A for safety in standing Upper Body Dressing : Set up;Supervision/safety;Sitting   Lower Body Dressing: Minimal assistance;+2 for safety/equipment;Sit to/from stand Lower Body Dressing Details (indicate cue type and reason):  Able to bring ankle to knee for donning new sock onto right foot             Functional mobility during ADLs: Minimal assistance;+2 for safety/equipment;Rolling walker(sit<>Stand only) General ADL Comments: Pt presenting with increased impulsivity and poor  awareness; feel this is baseline. Pt with unconventional techniques for ADLs and decreased awareness of safety.      Vision         Perception     Praxis      Pertinent Vitals/Pain Pain Assessment: 0-10 Pain Score: 8  Pain Location: L foot, especially in dependent position Pain Descriptors / Indicators: Aching;Throbbing Pain Intervention(s): Monitored during session;Limited activity within patient's tolerance;Repositioned;Other (comment)(Elevated)     Hand Dominance     Extremity/Trunk Assessment Upper Extremity Assessment Upper Extremity Assessment: Generalized weakness   Lower Extremity Assessment Lower Extremity Assessment: Defer to PT evaluation;LLE deficits/detail LLE Deficits / Details: L foot wrapped postop; Very painful LLE/foot with movement; Able to straight leg raise against gravity LLE: Unable to fully assess due to pain   Cervical / Trunk Assessment Cervical / Trunk Assessment: Normal   Communication Communication Communication: No difficulties   Cognition Arousal/Alertness: Awake/alert Behavior During Therapy: WFL for tasks assessed/performed;Impulsive Overall Cognitive Status: Within Functional Limits for tasks assessed Area of Impairment: Following commands;Safety/judgement                       Following Commands: Follows one step commands with increased time Safety/Judgement: Decreased awareness of safety     General Comments: Pt with decreased safety awareness and impulsive throughout session. With home set up and PLOF, pt not forthcoming with information. Pt also with alternating moods from frustration to laughter. Unsure of pt baselien and feel this is close to his baseline cognition   General Comments  post-op shoe delivered during session    Exercises     Shoulder Instructions      Home Living Family/patient expects to be discharged to:: Private residence Living Arrangements: Other relatives Available Help at Discharge:  Family;Available PRN/intermittently(Sister and neice) Type of Home: House Home Access: Stairs to enter [employer] of Steps: 1(one step from garage)   Home Layout: One level     Bathroom Shower/Tub: Teacher, early years/pre: Standard Bathroom Accessibility: (not sure if a wheelchair will fit in bathroom)   Home Equipment: Wheelchair - manual(His WC is 52 years old and dilapidated)          Prior Functioning/Environment Level of Independence: Independent with assistive device(s)(and unconventional strategies for managing )        Comments: Tells PT/OT that he typically crawls in his house to get around; He describes being able to use side of tub to help push self up from what sounds like floor>tubside>toilet; has an old, beat-up wheelchair, but tells [place] he is not allowed to use in his home per the landlord; initially in session, [place] indicated he is familiar with the use of crutches (though he did not give specifics of when he needed crutches); [place] was not entirely forthcoming with why he has been crawling to get around in his home; uses SCAT to get to/from HD(dons LB clothing while seated on the floor and laterally lea)        OT Problem List: Decreased strength;Decreased range of motion;Impaired balance (sitting and/or standing);Decreased activity tolerance;Decreased safety awareness;Decreased knowledge of use of DME or AE;Decreased knowledge of precautions;Decreased cognition;Pain      OT Treatment/Interventions: Self-care/ADL training;Therapeutic exercise;Energy  conservation;DME and/or AE instruction;Therapeutic activities;Patient/family education    OT Goals(Current goals can be found in the care plan section) Acute Rehab OT Goals Patient Stated Goal: did not state; indicated he has a lot on his mind OT Goal Formulation: With patient Time For Goal Achievement: 12/26/17 Potential to Achieve Goals: Good ADL Goals Pt Will Perform Lower Body Dressing:  with set-up;with supervision;sitting/lateral leans Pt Will Transfer to Toilet: with set-up;with supervision;stand pivot transfer;bedside commode Pt Will Perform Toileting - Clothing Manipulation and hygiene: with set-up;with supervision;sitting/lateral leans Additional ADL Goal #1: Pt will perform simulated care transfer with supervision  OT Frequency: Min 2X/week   Barriers to D/C: Decreased caregiver support  Pt lives with his sister but unsure of support from his report       Co-evaluation PT/OT/SLP Co-Evaluation/Treatment: Yes Reason for Co-Treatment: Complexity of the patient's impairments (multi-system involvement);For patient/therapist safety;To address functional/ADL transfers(patient-centered decision based on tolerance/participation)   OT goals addressed during session: ADL's and self-care      AM-PAC OT "6 Clicks" Daily Activity     Outcome Measure Help from another person eating meals?: None Help from another person taking care of personal grooming?: A Little Help from another person toileting, which includes using toliet, bedpan, or urinal?: A Little Help from another person bathing (including washing, rinsing, drying)?: A Little Help from another person to put on and taking off regular upper body clothing?: None Help from another person to put on and taking off regular lower body clothing?: A Little 6 Click Score: 20   End of Session Equipment Utilized During Treatment: Rolling walker;Other (comment)(post-op shoe) Nurse Communication: Mobility status;Precautions;Weight bearing status  Activity Tolerance: Patient tolerated treatment well Patient left: in bed;with call bell/phone within reach  OT Visit Diagnosis: Unsteadiness on feet (R26.81);Other abnormalities of gait and mobility (R26.89);Muscle weakness (generalized) (M62.81);Other symptoms and signs involving cognitive function;Pain Pain - Right/Left: Left Pain - part of body: Ankle and joints of foot                 Time: 2010-0712 OT Time Calculation (min): 23 min Charges:  OT General Charges $OT Visit: 1 Visit OT Evaluation $OT Eval Moderate Complexity: [place], OTR/L Acute Rehab Pager: [phone] Office: [place] 12/12/2017, 4:41 PM

## 2017-12-12 NOTE — Progress Notes (Signed)
Patient ID: [name], male   DOB: 1965-12-30, 52 y.o.   MRN: [id number] Dressing dry this AM, post op day 1 left great toe amputation, recommended non weight bearing left foot as ideal but may be touch down weight bearing on heel, I'll follow up in office in 1 week, keep dressing dry

## 2017-12-13 ENCOUNTER — Encounter (HOSPITAL_COMMUNITY)
Admission: EM | Disposition: A | Discharge status: Skilled Nursing Facility | Payer: Self-pay | Attending: Internal Medicine

## 2017-12-13 DIAGNOSIS — Z899 Acquired absence of limb, unspecified: Secondary | ICD-10-CM

## 2017-12-13 DIAGNOSIS — R011 Cardiac murmur, unspecified: Secondary | ICD-10-CM

## 2017-12-13 LAB — BASIC METABOLIC PANEL
Anion gap: 14 (ref 5–15)
BUN: 46 mg/dL — ABNORMAL HIGH (ref 6–20)
CO2: 24 mmol/L (ref 22–32)
Calcium: 8.2 mg/dL — ABNORMAL LOW (ref 8.9–10.3)
Chloride: 97 mmol/L — ABNORMAL LOW (ref 98–111)
Creatinine, Ser: 8.66 mg/dL — ABNORMAL HIGH (ref 0.61–1.24)
GFR calc Af Amer: 7 mL/min — ABNORMAL LOW (ref >60)
GFR calc non Af Amer: 6 mL/min — ABNORMAL LOW (ref >60)
Glucose, Bld: 87 mg/dL (ref 70–99)
Potassium: 4.8 mmol/L (ref 3.5–5.1)
Sodium: 135 mmol/L (ref 135–145)

## 2017-12-13 LAB — CBC
HCT: 30.2 % — ABNORMAL LOW (ref 39.0–52.0)
Hemoglobin: 9.3 g/dL — ABNORMAL LOW (ref 13.0–17.0)
MCH: 29.2 pg (ref 26.0–34.0)
MCHC: 30.8 g/dL (ref 30.0–36.0)
MCV: 94.7 fL (ref 80.0–100.0)
Platelets: 123 10*3/uL — ABNORMAL LOW (ref 150–400)
RBC: 3.19 MIL/uL — ABNORMAL LOW (ref 4.22–5.81)
RDW: 16.2 % — ABNORMAL HIGH (ref 11.5–15.5)
WBC: 4.2 10*3/uL (ref 4.0–10.5)
nRBC: 0 % (ref 0.0–0.2)

## 2017-12-13 LAB — PROTIME-INR
INR: 1.14
Prothrombin Time: 14.5 seconds (ref 11.4–15.2)

## 2017-12-13 SURGERY — EXCHANGE OF A DIALYSIS CATHETER
Anesthesia: Choice

## 2017-12-13 NOTE — Progress Notes (Signed)
    [facility] has been requested to perform a transesophageal echocardiogram on 12/13 for bacteremia.  After careful review of history and examination, the risks and benefits of transesophageal echocardiogram with anesthesia have been explained including risks of esophageal damage, perforation (1:10,000 risk), bleeding, pharyngeal hematoma as well as other potential complications associated with conscious sedation including aspiration, arrhythmia, respiratory failure and death. Alternatives to treatment were discussed, questions were answered. Patient is willing to proceed.   Rosaria Ferries, PA-C 12/13/2017 3:55 PM

## 2017-12-13 NOTE — Progress Notes (Signed)
PROGRESS NOTE    [name]  ZGY:[id number] DOB: [birth year] DOA: 12/07/2017 PCP: [name], MD   Brief Narrative:  HPI on 12/07/2017 by Dr. Merton Border [name]  is a 52 y.o. male, with past medical history significant for end-stage renal disease on hemodialysis who presented with 1 week history of not feeling well with the fever of 102.  Patient denies any chest pains or shortness of breath.  Patient reports a chronic cough.  Is complaining of left great toe pain and redness that has been getting worse.  He was in dialysis when he was found to be febrile at 101.5 and he was started on vancomycin and Fortaz.  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 In the emergency room patient was noted to be afebrile however his blood pressure was low.  His white blood cell count was 8.1 with a chest x-ray that did not show any acute changes and left foot x-ray that did not show any osteomyelitis.  Interim history Admitted for toe osteomyelitis and MSSA bacteremia.  Status post left first great toe amputation.  Nephrology also consulted for ESRD. 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  Small first IP joint effusion with marrow edema on either side of the joint concerning for septic arthritis and osteomyelitis. -Orthopedic surgery consulted and appreciated, s/p left first great toe amputation -Infectious disease consulted and appreciated-recommended TEE.  Pending results of TEE, would at least treat with 4 weeks of IV antibiotics -Cardiology consulted for TEE -Patient was placed on vancomycin and ceftazidime, and has been transitioned to  cefazolin -Echocardiogram was unremarkable for vegetation -PT recommended home health with wheelchair and rolling walker  Right flank pain tenderness -Status post right nephrostomy tube in the past -CT abdomen pelvis without acute findings -Right flank pain improving  ESRD on hemodialysis -Nephrology consulted and appreciated -Access issues, patient had difficulties with blood flow -Vascular surgery consulted and appreciated, plan for attempted [facility] exchange on 12/13/2017- HOWEVER it seems that patient's [facility] is operating at this time so [facility] exchange now canceled -plan for HD on 12/14/2017  Hyperkalemia -Patient given lokelma on 12/12/2017 -Resolved, continue to monitor BMP  Anemia of chronic disease -Patient will receive Aranesp today -Hemoglobin currently 9.3 -Continue to monitor CBC  Hypothyroidism -Continue Synthroid  Chronic hypotension -Continue midodrine  PAD -Continue Plavix  Anxiety -Continue Ativan  DVT Prophylaxis  heparin  Code Status: Full  Family Communication: None at bedside  Disposition Plan: Admitted. Suspect home with home health when stable. Currently pending TEE, and further ID recommendations.  Consultants Orthopedic surgery ID Nephrology Vascular surgery Cardiology  Procedures  Left great toe amputation  Antibiotics   Anti-infectives (From admission, onward)   Start     Dose/Rate Route Frequency Ordered Stop   12/14/17 2200  ceFAZolin (ANCEF) IVPB 1 g/50 mL premix     1 g 100 mL/hr over 30 Minutes Intravenous Every 24 hours 12/12/17 1055     12/13/17 1500  ceFAZolin (ANCEF) IVPB 2g/100 mL premix  Status:  Discontinued    Note to Pharmacy:  Send with pt to OR   2 g 200 mL/hr over 30 Minutes Intravenous  To Short Stay 12/12/17 0755 12/12/17 1053   12/13/17 1500  ceFAZolin (ANCEF) IVPB 1 g/50 mL premix    Note to Pharmacy:  Send with pt to OR   1 g 100 mL/hr over 30 Minutes Intravenous To ShortStay Surgical 12/12/17 1021 12/14/17  1500   12/12/17 2200  ceFAZolin (ANCEF) IVPB 1 g/50 mL premix     1 g 100 mL/hr over 30 Minutes Intravenous Every 24 hours 12/11/17 1344 12/12/17 2212   12/11/17 0800  ceFAZolin (ANCEF) 3 g in dextrose 5 % 50 mL IVPB     3 g 100 mL/hr over 30 Minutes Intravenous To ShortStay Surgical 12/10/17 1508 12/11/17 0937   12/10/17 2000  cefTAZidime (FORTAZ) 2 g in sodium chloride 0.9 % 100 mL IVPB  Status:  Discontinued     2 g 200 mL/hr over 30 Minutes Intravenous Every M-W-F (2000) 12/07/17 2218 12/08/17 1029   12/10/17 1200  vancomycin (VANCOCIN) IVPB 1000 mg/200 mL premix  Status:  Discontinued     1,000 mg 200 mL/hr over 60 Minutes Intravenous Every M-W-F (Hemodialysis) 12/07/17 2218 12/08/17 1029   12/08/17 2200  ceFAZolin (ANCEF) IVPB 1 g/50 mL premix  Status:  Discontinued     1 g 100 mL/hr over 30 Minutes Intravenous Every 24 hours 12/08/17 1250 12/11/17 1344   12/08/17 2000  cefTAZidime (FORTAZ) 2 g in sodium chloride 0.9 % 100 mL IVPB  Status:  Discontinued     2 g 200 mL/hr over 30 Minutes Intravenous  Once 12/07/17 2218 12/08/17 1157   12/08/17 1200  vancomycin (VANCOCIN) IVPB 1000 mg/200 mL premix  Status:  Discontinued     1,000 mg 200 mL/hr over 60 Minutes Intravenous Every T-Th-Sa (Hemodialysis) 12/07/17 2218 12/08/17 1157   12/08/17 1100  ceFAZolin (ANCEF) IVPB 1 g/50 mL premix  Status:  Discontinued     1 g 100 mL/hr over 30 Minutes Intravenous Every 24 hours 12/08/17 1029 12/08/17 1250   12/08/17 0000  vancomycin (VANCOCIN) IVPB 1000 mg/200 mL premix  Status:  Discontinued     1,000 mg 200 mL/hr over 60 Minutes Intravenous Every M-W-F (Hemodialysis) 12/07/17 2216 12/07/17 2218   12/08/17 0000  cefTAZidime (FORTAZ) 2 g in sodium chloride 0.9 % 100 mL IVPB  Status:  Discontinued     2 g 200 mL/hr over 30 Minutes Intravenous Every M-W-F (2000) 12/07/17 2216 12/07/17 2218   12/07/17 2215  cefTAZidime (FORTAZ) 2 g in sodium chloride 0.9 % 100 mL IVPB  Status:  Discontinued     2  g 200 mL/hr over 30 Minutes Intravenous Every M-W-F (2000) 12/07/17 2209 12/07/17 2216      Subjective:   [name] seen and examined today.  Patient very upset this morning.  Continues to have left foot pain and states that he is not ready to go home.  He states he is feeling very weak and does not know if his catheter for hemodialysis will work again.  Patient currently denies chest pain, shortness of breath, abdominal pain, nausea or vomiting, diarrhea or constipation, dizziness or headache.  Objective:   Vitals:   12/12/17 1627 12/12/17 2104 12/13/17 0500 12/13/17 0850  BP: 112/75 113/64 109/66 96/62  Pulse: 74 71 65 66  Resp: 18 18 18 20   Temp: 99.6 F (37.6 C) 99 F (37.2 C) 97.7 F (36.5 C) 98.4 F (36.9 C)  TempSrc: Oral Oral  Oral  SpO2: 98% 97% 98% 97%  Weight:  122.3 kg    Height:  Intake/Output Summary (Last 24 hours) at 12/13/2017 1133 Last data filed at 12/13/2017 0951 Gross per 24 hour  Intake 450.17 ml  Output 2467 ml  Net -2016.83 ml   Filed Weights   12/12/17 1111 12/12/17 1550 12/12/17 2104  Weight: 125 kg 122.3 kg 122.3 kg   Exam  General: Well developed, well nourished, NAD, appears stated age  84: NCAT, mucous membranes moist.   Neck: Supple  Cardiovascular: S1 S2 auscultated, 3/6 SEM, RRR  Respiratory: Clear to auscultation bilaterally with equal chest rise  Abdomen: Soft, nontender, nondistended, + bowel sounds  Extremities: warm dry without cyanosis clubbing or edema.  Dressing on right foot  Neuro: AAOx3, nonfocal  Psych: Upset, anxious  Data Reviewed: I have personally reviewed following labs and imaging studies  CBC: Recent Labs  Lab 12/07/17 1115  12/09/17 0541 12/10/17 0431 12/11/17 0433 12/12/17 0536 12/13/17 0351  WBC 8.1   < > 4.7 4.9 4.9 4.6 4.2  NEUTROABS 7.4  --   --   --   --   --   --   HGB 10.9*   < > 10.3* 9.7* 9.8* 9.4* 9.3*  HCT 36.0*   < > 34.9* 32.5* 32.6* 31.2* 30.2*  MCV 96.3   < >  95.9 95.3 94.8 95.7 94.7  PLT 92*   < > 106* 128* 136* 126* 123*   < > = values in this interval not displayed.   Basic Metabolic Panel: Recent Labs  Lab 12/09/17 0541 12/09/17 1241 12/10/17 0431 12/11/17 0433 12/12/17 0536 12/13/17 0351  NA 136  --  135 137 134* 135  K 4.5  --  5.0 5.9* 6.5* 4.8  CL 95*  --  95* 95* 96* 97*  CO2 27  --  27 27 21* 24  GLUCOSE 111*  --  90 81 74 87  BUN 44*  --  67* 85* 92* 46*  CREATININE 8.78*  --  11.18* 12.92* 13.41* 8.66*  CALCIUM 8.5*  --  8.7* 9.3 8.4* 8.2*  PHOS  --  4.2  --   --   --   --    GFR: Estimated Creatinine Clearance: 13.3 mL/min (A) (by C-G formula based on SCr of 8.66 mg/dL (H)). Liver Function Tests: Recent Labs  Lab 12/07/17 1115  AST 19  ALT 14  ALKPHOS 56  BILITOT 1.1  PROT 8.3*  ALBUMIN 3.0*   No results for input(s): LIPASE, AMYLASE in the last 168 hours. No results for input(s): AMMONIA in the last 168 hours. Coagulation Profile: Recent Labs  Lab 12/07/17 1115 12/13/17 0351  INR 1.15 1.14   Cardiac Enzymes: Recent Labs  Lab 12/07/17 1243  TROPONINI 0.04*   BNP (last 3 results) No results for input(s): PROBNP in the last 8760 hours. HbA1C: No results for input(s): HGBA1C in the last 72 hours. CBG: No results for input(s): GLUCAP in the last 168 hours. Lipid Profile: No results for input(s): CHOL, HDL, LDLCALC, TRIG, CHOLHDL, LDLDIRECT in the last 72 hours. Thyroid Function Tests: No results for input(s): TSH, T4TOTAL, FREET4, T3FREE, THYROIDAB in the last 72 hours. Anemia Panel: No results for input(s): VITAMINB12, FOLATE, FERRITIN, TIBC, IRON, RETICCTPCT in the last 72 hours. Urine analysis:    Component Value Date/Time   COLORURINE RED (A) 07/31/2010 0410   APPEARANCEUR TURBID (A) 07/31/2010 0410   LABSPEC 1.044 (H) 07/31/2010 0410   PHURINE 6.5 07/31/2010 0410   GLUCOSEU 100 (A) 07/31/2010 0410   HGBUR LARGE (A) 07/31/2010 0410  BILIRUBINUR LARGE (A) 07/31/2010 0410   KETONESUR 40  (A) 07/31/2010 0410   PROTEINUR >300 (A) 07/31/2010 0410   UROBILINOGEN 4.0 (H) 07/31/2010 0410   NITRITE POSITIVE (A) 07/31/2010 0410   LEUKOCYTESUR LARGE (A) 07/31/2010 0410   Sepsis Labs: @LABRCNTIP (procalcitonin:4,lacticidven:4)  ) Recent Results (from the past 240 hour(s))  Culture, blood (Routine x 2)     Status: Abnormal   Collection Time: 12/07/17 11:15 AM  Result Value Ref Range Status   Specimen Description BLOOD RIGHT ANTECUBITAL  Final   Special Requests   Final    BOTTLES DRAWN AEROBIC AND ANAEROBIC Blood Culture adequate volume   Culture  Setup Time   Final    GRAM POSITIVE COCCI IN CLUSTERS IN BOTH AEROBIC AND ANAEROBIC BOTTLES CRITICAL RESULT CALLED TO, READ BACK BY AND VERIFIED WITH: [place], [place] [id number] FCP Performed at [facility], [place] [address] [place], [place] [address]    Culture STAPHYLOCOCCUS AUREUS (A)  Final   Report Status 12/10/2017 FINAL  Final   Organism ID, Bacteria STAPHYLOCOCCUS AUREUS  Final      Susceptibility   Staphylococcus aureus - MIC*    CIPROFLOXACIN <=0.5 SENSITIVE Sensitive     ERYTHROMYCIN <=0.25 SENSITIVE Sensitive     GENTAMICIN <=0.5 SENSITIVE Sensitive     OXACILLIN 0.5 SENSITIVE Sensitive     TETRACYCLINE <=1 SENSITIVE Sensitive     VANCOMYCIN <=0.5 SENSITIVE Sensitive     TRIMETH/SULFA <=10 SENSITIVE Sensitive     CLINDAMYCIN <=0.25 SENSITIVE Sensitive     RIFAMPIN <=0.5 SENSITIVE Sensitive     Inducible Clindamycin NEGATIVE Sensitive     * STAPHYLOCOCCUS AUREUS  Blood Culture ID Panel (Reflexed)     Status: Abnormal   Collection Time: 12/07/17 11:15 AM  Result Value Ref Range Status   Enterococcus species NOT DETECTED NOT DETECTED Final   Listeria monocytogenes NOT DETECTED NOT DETECTED Final   Staphylococcus species DETECTED (A) NOT DETECTED Final    Comment: CRITICAL RESULT CALLED TO, READ BACK BY AND VERIFIED WITH: PHARMD ESPADA, M [phone] FCP    Staphylococcus aureus (BCID) DETECTED (A) NOT  DETECTED Final    Comment: Methicillin (oxacillin) susceptible Staphylococcus aureus (MSSA). Preferred therapy is anti staphylococcal beta lactam antibiotic (Cefazolin or Nafcillin), unless clinically contraindicated. CRITICAL RESULT CALLED TO, READ BACK BY AND VERIFIED WITH: PHARMD ESPADA, M [phone] [id number] FCP    Methicillin resistance NOT DETECTED NOT DETECTED Final   Streptococcus species NOT DETECTED NOT DETECTED Final   Streptococcus agalactiae NOT DETECTED NOT DETECTED Final   Streptococcus pneumoniae NOT DETECTED NOT DETECTED Final   Streptococcus pyogenes NOT DETECTED NOT DETECTED Final   Acinetobacter baumannii NOT DETECTED NOT DETECTED Final   Enterobacteriaceae species NOT DETECTED NOT DETECTED Final   Enterobacter cloacae complex NOT DETECTED NOT DETECTED Final   Escherichia coli NOT DETECTED NOT DETECTED Final   Klebsiella oxytoca NOT DETECTED NOT DETECTED Final   Klebsiella pneumoniae NOT DETECTED NOT DETECTED Final   Proteus species NOT DETECTED NOT DETECTED Final   Serratia marcescens NOT DETECTED NOT DETECTED Final   Haemophilus influenzae NOT DETECTED NOT DETECTED Final   Neisseria meningitidis NOT DETECTED NOT DETECTED Final   Pseudomonas aeruginosa NOT DETECTED NOT DETECTED Final   Candida albicans NOT DETECTED NOT DETECTED Final   Candida glabrata NOT DETECTED NOT DETECTED Final   Candida krusei NOT DETECTED NOT DETECTED Final   Candida parapsilosis NOT DETECTED NOT DETECTED Final   Candida tropicalis NOT DETECTED NOT DETECTED Final  Comment: Performed at [facility], [place] [address], [place], [place] [address]  Culture, blood (Routine x 2)     Status: Abnormal   Collection Time: 12/07/17 11:17 AM  Result Value Ref Range Status   Specimen Description BLOOD BLOOD LEFT ARM  Final   Special Requests   Final    BOTTLES DRAWN AEROBIC AND ANAEROBIC Blood Culture adequate volume   Culture  Setup Time   Final    GRAM POSITIVE COCCI IN CLUSTERS ANAEROBIC BOTTLE  ONLY CRITICAL VALUE NOTED.  VALUE IS CONSISTENT WITH PREVIOUSLY REPORTED AND CALLED VALUE.    Culture (A)  Final    STAPHYLOCOCCUS AUREUS SUSCEPTIBILITIES PERFORMED ON PREVIOUS CULTURE WITHIN THE LAST 5 DAYS. Performed at [facility], [place] [address] [place], [place] [address]    Report Status 12/10/2017 FINAL  Final  MRSA PCR Screening     Status: None   Collection Time: 12/08/17 12:05 AM  Result Value Ref Range Status   MRSA by PCR NEGATIVE NEGATIVE Final    Comment:        The GeneXpert MRSA Assay (FDA approved for NASAL specimens only), is one component of a comprehensive MRSA colonization surveillance program. It is not intended to diagnose MRSA infection nor to guide or monitor treatment for MRSA infections. Performed at [facility], [place] [address], [place], [place] [address]       Radiology Studies: No results found.   Scheduled Meds: . calcium acetate  2,668 mg Oral TID WC  . Chlorhexidine Gluconate Cloth  6 each Topical Q0600  . clopidogrel  75 mg Oral Daily  . darbepoetin (ARANESP) injection - DIALYSIS  40 mcg Intravenous Q Wed-HD  . docusate sodium  100 mg Oral BID  . doxercalciferol  6 mcg Intravenous Q M,W,F-HD  . famotidine  20 mg Oral QHS  . heparin  5,000 Units Subcutaneous Q8H  .  HYDROmorphone (DILAUDID) injection  1-2 mg Intravenous Once  . levothyroxine  125 mcg Oral QAC breakfast  . midodrine  5 mg Oral TID WC  . sodium chloride flush  3 mL Intravenous Q12H  . traZODone  100 mg Oral QHS  . zolpidem  10 mg Oral QHS   Continuous Infusions: . sodium chloride Stopped (12/08/17 1326)  . sodium chloride 10 mL/hr at 12/11/17 0825  . sodium chloride 10 mL/hr at 12/11/17 1216  .  ceFAZolin (ANCEF) IV    . [START ON 12/14/2017]  ceFAZolin (ANCEF) IV    . methocarbamol (ROBAXIN) IV       LOS: 6 days   Time Spent in minutes   30 minutes  [name] D.O. on 12/13/2017 at 11:33 AM  Between 7am to 7pm - Please see pager noted on  [email or web address]  After 7pm go to [email or web address]  And look for the night coverage person covering for me after hours  [facility] Office  [phone]

## 2017-12-13 NOTE — Care Management Important Message (Signed)
Important Message  Patient Details  Name: [name] MRN: [id number] Date of Birth: 1965-11-10   Medicare Important Message Given:  Yes    [name] 12/13/2017, 4:10 PM

## 2017-12-13 NOTE — Progress Notes (Signed)
[name] for Infectious Disease  Date of Admission:  12/07/2017     Total days of antibiotics 7         ASSESSMENT/PLAN  [name] continues to receive treatment for MSSA bacteremia likely associated with foot infection and is now s/p amputation. Pain is adequately controlled and he has remained afebrile without leukocytosis. Follow up blood cultures have remained without growth to date. Scheduled for TEE tomorrow to rule out endocarditis. Continues to have the tunneled catheter which appears to be okay at present. Previous plan for exchange on hold as catheter was able to be used. This still may remain a potential source of infection. He is on Day 7 of Cefazolin and tolerating it without complication or adverse side effects. He will require prolonged therapy of at least 4 weeks possibly up to 6 weeks depending on TEE results.   1. Continue current dose of Cefazolin. 2. Monitor repeat cultures for bacteremia clearance.  3. Await TEE results for final duration of therapy recommendations with dialysis.    Principal Problem:   MSSA bacteremia Active Problems:   End stage renal disease ([place])   Fever   Right flank pain   Osteomyelitis of great toe of left foot ([place])   . calcium acetate  2,668 mg Oral TID WC  . Chlorhexidine Gluconate Cloth  6 each Topical Q0600  . clopidogrel  75 mg Oral Daily  . darbepoetin (ARANESP) injection - DIALYSIS  40 mcg Intravenous Q Wed-HD  . docusate sodium  100 mg Oral BID  . doxercalciferol  6 mcg Intravenous Q M,W,F-HD  . famotidine  20 mg Oral QHS  . heparin  5,000 Units Subcutaneous Q8H  .  HYDROmorphone (DILAUDID) injection  1-2 mg Intravenous Once  . levothyroxine  125 mcg Oral QAC breakfast  . midodrine  5 mg Oral TID WC  . sodium chloride flush  3 mL Intravenous Q12H  . traZODone  100 mg Oral QHS  . zolpidem  10 mg Oral QHS    SUBJECTIVE:  Afebrile without leukocytosis. Repeat cultures without growth in 1 day. Tunneled catheter  able to be used and replacement of catheter cancelled at this time. Scheduled for TEE tomorrow.   No problems overnight.   Allergies  Allergen Reactions  . Furadantin [Nitrofurantoin] Other (See Comments)    UNSPECIFIED REACTION   . Mandelamine [Methenamine] Other (See Comments)    UNSPECIFIED REACTION   . Noroxin [Norfloxacin] Other (See Comments)    UNSPECIFIED REACTION   . Carmine Nausea Only  . Contrast Media [Iodinated Diagnostic Agents] Nausea And Vomiting    Oral dye causes vomiting, IV dye is okay  . Hydrocodone Other (See Comments)    LIKELY NOT REACTION TO HYDROCODONE Pt states that after 3 weeks of taking this medication he will began to "twitch"  . Metrizamide Nausea And Vomiting    Oral dye causes vomiting, IV dye is okay  . Sulfa Antibiotics Cough    Childhood reaction - pt could not confirm that it was a cough  . Sulfur Cough    Childhood reaction - pt could not confirm that it was a cough     Review of Systems: Review of Systems  Constitutional: Negative for chills and fever.  Respiratory: Negative for cough, sputum production, shortness of breath and wheezing.   Cardiovascular: Negative for chest pain and leg swelling.  Gastrointestinal: Negative for abdominal pain, constipation, diarrhea, nausea and vomiting.  Skin: Negative for rash.  Neurological: Negative for weakness  and headaches.    OBJECTIVE: Vitals:   12/12/17 1627 12/12/17 2104 12/13/17 0500 12/13/17 0850  BP: 112/75 113/64 109/66 96/62  Pulse: 74 71 65 66  Resp: 18 18 18 20   Temp: 99.6 F (37.6 C) 99 F (37.2 C) 97.7 F (36.5 C) 98.4 F (36.9 C)  TempSrc: Oral Oral  Oral  SpO2: 98% 97% 98% 97%  Weight:  122.3 kg    Height:       Body mass index is 37.6 kg/m.  Physical Exam Constitutional:      General: He is not in acute distress.    Appearance: He is well-developed and well-nourished.  Cardiovascular:     Rate and Rhythm: Normal rate and regular rhythm.     Pulses: Intact  distal pulses.     Heart sounds: Murmur present. No friction rub.  Pulmonary:     Effort: Pulmonary effort is normal. No respiratory distress.     Breath sounds: Normal breath sounds. No stridor. No wheezing or rales.  Chest:     Chest wall: No tenderness.  Abdominal:     General: Bowel sounds are normal. There is no distension.     Palpations: Abdomen is soft. There is no mass.     Tenderness: There is no abdominal tenderness. There is no guarding or rebound.  Musculoskeletal:     Comments: Surgical dressing is intact, clean and dry.   Skin:    General: Skin is warm and dry.     Findings: No rash.  Neurological:     Mental Status: He is alert and oriented to person, place, and time.  Psychiatric:        Mood and Affect: Mood and affect normal.     Lab Results Lab Results  Component Value Date   WBC 4.2 12/13/2017   HGB 9.3 (L) 12/13/2017   HCT 30.2 (L) 12/13/2017   MCV 94.7 12/13/2017   PLT 123 (L) 12/13/2017    Lab Results  Component Value Date   CREATININE 8.66 (H) 12/13/2017   BUN 46 (H) 12/13/2017   NA 135 12/13/2017   K 4.8 12/13/2017   CL 97 (L) 12/13/2017   CO2 24 12/13/2017    Lab Results  Component Value Date   ALT 14 12/07/2017   AST 19 12/07/2017   ALKPHOS 56 12/07/2017   BILITOT 1.1 12/07/2017     Microbiology: Recent Results (from the past 240 hour(s))  Culture, blood (Routine x 2)     Status: Abnormal   Collection Time: 12/07/17 11:15 AM  Result Value Ref Range Status   Specimen Description BLOOD RIGHT ANTECUBITAL  Final   Special Requests   Final    BOTTLES DRAWN AEROBIC AND ANAEROBIC Blood Culture adequate volume   Culture  Setup Time   Final    GRAM POSITIVE COCCI IN CLUSTERS IN BOTH AEROBIC AND ANAEROBIC BOTTLES CRITICAL RESULT CALLED TO, READ BACK BY AND VERIFIED WITH: [place], Lebo [id number] FCP Performed at [facility], [place] [address], [place], [place] [address]    Culture STAPHYLOCOCCUS AUREUS (A)  Final   Report  Status 12/10/2017 FINAL  Final   Organism ID, Bacteria STAPHYLOCOCCUS AUREUS  Final      Susceptibility   Staphylococcus aureus - MIC*    CIPROFLOXACIN <=0.5 SENSITIVE Sensitive     ERYTHROMYCIN <=0.25 SENSITIVE Sensitive     GENTAMICIN <=0.5 SENSITIVE Sensitive     OXACILLIN 0.5 SENSITIVE Sensitive     TETRACYCLINE <=1 SENSITIVE Sensitive  VANCOMYCIN <=0.5 SENSITIVE Sensitive     TRIMETH/SULFA <=10 SENSITIVE Sensitive     CLINDAMYCIN <=0.25 SENSITIVE Sensitive     RIFAMPIN <=0.5 SENSITIVE Sensitive     Inducible Clindamycin NEGATIVE Sensitive     * STAPHYLOCOCCUS AUREUS  Blood Culture ID Panel (Reflexed)     Status: Abnormal   Collection Time: 12/07/17 11:15 AM  Result Value Ref Range Status   Enterococcus species NOT DETECTED NOT DETECTED Final   Listeria monocytogenes NOT DETECTED NOT DETECTED Final   Staphylococcus species DETECTED (A) NOT DETECTED Final    Comment: CRITICAL RESULT CALLED TO, READ BACK BY AND VERIFIED WITH: PHARMD ESPADA, M [phone] [id number] FCP    Staphylococcus aureus (BCID) DETECTED (A) NOT DETECTED Final    Comment: Methicillin (oxacillin) susceptible Staphylococcus aureus (MSSA). Preferred therapy is anti staphylococcal beta lactam antibiotic (Cefazolin or Nafcillin), unless clinically contraindicated. CRITICAL RESULT CALLED TO, READ BACK BY AND VERIFIED WITH: PHARMD ESPADA, M [phone] FCP    Methicillin resistance NOT DETECTED NOT DETECTED Final   Streptococcus species NOT DETECTED NOT DETECTED Final   Streptococcus agalactiae NOT DETECTED NOT DETECTED Final   Streptococcus pneumoniae NOT DETECTED NOT DETECTED Final   Streptococcus pyogenes NOT DETECTED NOT DETECTED Final   Acinetobacter baumannii NOT DETECTED NOT DETECTED Final   Enterobacteriaceae species NOT DETECTED NOT DETECTED Final   Enterobacter cloacae complex NOT DETECTED NOT DETECTED Final   Escherichia coli NOT DETECTED NOT DETECTED Final   Klebsiella oxytoca NOT DETECTED NOT DETECTED Final    Klebsiella pneumoniae NOT DETECTED NOT DETECTED Final   Proteus species NOT DETECTED NOT DETECTED Final   Serratia marcescens NOT DETECTED NOT DETECTED Final   Haemophilus influenzae NOT DETECTED NOT DETECTED Final   Neisseria meningitidis NOT DETECTED NOT DETECTED Final   Pseudomonas aeruginosa NOT DETECTED NOT DETECTED Final   Candida albicans NOT DETECTED NOT DETECTED Final   Candida glabrata NOT DETECTED NOT DETECTED Final   Candida krusei NOT DETECTED NOT DETECTED Final   Candida parapsilosis NOT DETECTED NOT DETECTED Final   Candida tropicalis NOT DETECTED NOT DETECTED Final    Comment: Performed at [facility], [address], [place], [place] [address]  Culture, blood (Routine x 2)     Status: Abnormal   Collection Time: 12/07/17 11:17 AM  Result Value Ref Range Status   Specimen Description BLOOD BLOOD LEFT ARM  Final   Special Requests   Final    BOTTLES DRAWN AEROBIC AND ANAEROBIC Blood Culture adequate volume   Culture  Setup Time   Final    GRAM POSITIVE COCCI IN CLUSTERS ANAEROBIC BOTTLE ONLY CRITICAL VALUE NOTED.  VALUE IS CONSISTENT WITH PREVIOUSLY REPORTED AND CALLED VALUE.    Culture (A)  Final    STAPHYLOCOCCUS AUREUS SUSCEPTIBILITIES PERFORMED ON PREVIOUS CULTURE WITHIN THE LAST 5 DAYS. Performed at [facility], [place] [address], [place], [place] [address]    Report Status 12/10/2017 FINAL  Final  MRSA PCR Screening     Status: None   Collection Time: 12/08/17 12:05 AM  Result Value Ref Range Status   MRSA by PCR NEGATIVE NEGATIVE Final    Comment:        The GeneXpert MRSA Assay (FDA approved for NASAL specimens only), is one component of a comprehensive MRSA colonization surveillance program. It is not intended to diagnose MRSA infection nor to guide or monitor treatment for MRSA infections. Performed at [facility], [place] [address] [place], [place] [address]  Culture, blood (Routine X 2) w Reflex to ID Panel     Status: None  (Preliminary result)   Collection Time: 12/12/17  9:00 AM  Result Value Ref Range Status   Specimen Description BLOOD RIGHT HAND  Final   Special Requests   Final    BOTTLES DRAWN AEROBIC AND ANAEROBIC Blood Culture results may not be optimal due to an inadequate volume of blood received in culture bottles   Culture   Final    NO GROWTH 1 DAY Performed at [facility], [place] [address] [place], [place] [address]    Report Status PENDING  Incomplete  Culture, blood (Routine X 2) w Reflex to ID Panel     Status: None (Preliminary result)   Collection Time: 12/12/17  9:15 AM  Result Value Ref Range Status   Specimen Description BLOOD RIGHT FOREARM  Final   Special Requests   Final    BOTTLES DRAWN AEROBIC AND ANAEROBIC Blood Culture results may not be optimal due to an inadequate volume of blood received in culture bottles   Culture   Final    NO GROWTH 1 DAY Performed at [facility], [place] [address], [place], [place] [address]    Report Status PENDING  Incomplete     Terri Piedra, [place] [facility] [phone] Pager  12/13/2017  3:48 PM

## 2017-12-13 NOTE — Progress Notes (Addendum)
[facility] Progress Note   Subjective:  Seen in room. Upset regarding home/social situation, pt not willing to discuss further. Denies CP/dyspnea. HD went fine yesterday so TDC exchange cancelled for today - changed diet order. Will dialyze tomorrow inpatient to assure no further catheter issues.   Objective Vitals:   12/12/17 1627 12/12/17 2104 12/13/17 0500 12/13/17 0850  BP: 112/75 113/64 109/66 96/62  Pulse: 74 71 65 66  Resp: 18 18 18 20   Temp: 99.6 F (37.6 C) 99 F (37.2 C) 97.7 F (36.5 C) 98.4 F (36.9 C)  TempSrc: Oral Oral  Oral  SpO2: 98% 97% 98% 97%  Weight:  122.3 kg    Height:       Physical Exam General:Well appearing, NAD. Heart:RRR; 3/6 systolic murmur (heard best on L side) Lungs:CTAB Abdomen:soft, non-tender Extremities:L foot bandaged, trace LE edema Dialysis Access:TDC in R femoral/thigh  Additional Objective Labs: Basic Metabolic Panel: Recent Labs  Lab 12/09/17 1241  12/11/17 0433 12/12/17 0536 12/13/17 0351  NA  --    < > 137 134* 135  K  --    < > 5.9* 6.5* 4.8  CL  --    < > 95* 96* 97*  CO2  --    < > 27 21* 24  GLUCOSE  --    < > 81 74 87  BUN  --    < > 85* 92* 46*  CREATININE  --    < > 12.92* 13.41* 8.66*  CALCIUM  --    < > 9.3 8.4* 8.2*  PHOS 4.2  --   --   --   --    < > = values in this interval not displayed.   Liver Function Tests: Recent Labs  Lab 12/07/17 1115  AST 19  ALT 14  ALKPHOS 56  BILITOT 1.1  PROT 8.3*  ALBUMIN 3.0*   CBC: Recent Labs  Lab 12/07/17 1115  12/09/17 0541 12/10/17 0431 12/11/17 0433 12/12/17 0536 12/13/17 0351  WBC 8.1   < > 4.7 4.9 4.9 4.6 4.2  NEUTROABS 7.4  --   --   --   --   --   --   HGB 10.9*   < > 10.3* 9.7* 9.8* 9.4* 9.3*  HCT 36.0*   < > 34.9* 32.5* 32.6* 31.2* 30.2*  MCV 96.3   < > 95.9 95.3 94.8 95.7 94.7  PLT 92*   < > 106* 128* 136* 126* 123*   < > = values in this interval not displayed.   Blood Culture    Component Value Date/Time   SDES  BLOOD BLOOD LEFT ARM 12/07/2017 1117   SPECREQUEST  12/07/2017 1117    BOTTLES DRAWN AEROBIC AND ANAEROBIC Blood Culture adequate volume   CULT (A) 12/07/2017 1117    STAPHYLOCOCCUS AUREUS SUSCEPTIBILITIES PERFORMED ON PREVIOUS CULTURE WITHIN THE LAST 5 DAYS. Performed at [facility], [place] [address] [place], [place] [address]    REPTSTATUS 12/10/2017 FINAL 12/07/2017 1117   Medications: . sodium chloride Stopped (12/08/17 1326)  . sodium chloride 10 mL/hr at 12/11/17 0825  . sodium chloride 10 mL/hr at 12/11/17 1216  .  ceFAZolin (ANCEF) IV    . [START ON 12/14/2017]  ceFAZolin (ANCEF) IV    . methocarbamol (ROBAXIN) IV     . calcium acetate  2,668 mg Oral TID WC  . Chlorhexidine Gluconate Cloth  6 each Topical Q0600  . clopidogrel  75 mg Oral Daily  .  darbepoetin (ARANESP) injection - DIALYSIS  40 mcg Intravenous Q Wed-HD  . docusate sodium  100 mg Oral BID  . doxercalciferol  6 mcg Intravenous Q M,W,F-HD  . famotidine  20 mg Oral QHS  . heparin  5,000 Units Subcutaneous Q8H  .  HYDROmorphone (DILAUDID) injection  1-2 mg Intravenous Once  . levothyroxine  125 mcg Oral QAC breakfast  . midodrine  5 mg Oral TID WC  . sodium chloride flush  3 mL Intravenous Q12H  . traZODone  100 mg Oral QHS  . zolpidem  10 mg Oral QHS    Dialysis Orders: MWF -AF 4.5h 450/800 122.5kg 2/2.25 bath R thigh TDC Hep 3700  -Parsabiv 5mg  qHD -Hectorol81mcg IV qHD  Assessment/Plan: 1. MSSABacteremia: Presumed due toL foot osteomyelitis.On Cefazolin per pharm.TDC does not appear infected. As access options are extremely limited, plan was to try to clear infection without having to remove cath. ID consulting, TTE negative for IE.  2. Foot Ulcer/LLE cellulitis: Osteomyelitisof L great toe, now s/p amputation 12/10. R flank wound near old nephrostomy site - CTabd showed no signs of infection. 3. ESRD: Continue HD per MWF schedule. Difficulties with blood flows in TDCwith prev  HD, s/p multiple tPA dwell without much improvement. VVS consulted, plan was for attempted [facility] exchange on 12/12 with Dr. Donzetta Matters. Thankfully, TDC ran fine on 12/11 after repeat tPA dwell using 3mg  (as opposed to usual 2mg ) per lumen. Will cancel [facility] exchange for now. Next HD tomorrow (12/13) 4. Hypotension/volume: BP chronically low, on midodrine for BP support. Follow. 5. Anemiaof CKD: Hgb 9.3, no ESA as outpatient. Small dose (Aranesp 40) given 12/11. 6. Secondary Hyperparathyroidism: CorrCa/Phos ok.Continue VDRA and binders. Unable to get parsabiv because not available in hospital 7. Nutrition -Currently on regular diet. Follow labs.Renavite. Adding pro-stat. 8. Hx seizures - per primary 9. GERD  [name] 12/13/2017, 10:13 AM  [facility] Pager: [phone]  Pt seen, examined and agree w A/P as above.  [name] MD [employer] pager [phone]   12/13/2017, 12:07 PM

## 2017-12-13 NOTE — Progress Notes (Signed)
   Thankfully tunneled dialysis catheter was able to be used for dialysis yesterday.  Vascular surgery will sign off.  If there are further issues we can consider exchange of tunneled dialysis catheter in the OR only.  If this fails he will need lumbar access.  Dustine Stickler C. Donzetta Matters, MD [facility] Office: [phone] Pager: [phone]

## 2017-12-13 NOTE — Care Management Note (Addendum)
Case Management Note  Patient Details  Name: [name] MRN: [id number] Date of Birth: 1965-07-05  Subjective/Objective:  Pt presented for left great toe pain and fever-treating for sepsis secondary to left toe osteomyelitis. S/p left great toe amputation 12-11-17. HD-MWF plan for HD 12-14-17.                  Action/Plan: CM did attempt to talk with patient and he was sleeping asking Nurse Case Manager to follow up at another time for disposition needs.   Expected Discharge Date:                  Expected Discharge Plan:  [place]  In-House Referral:  NA  Discharge planning Services  CM Consult  Post Acute Care Choice:  Home Health, Durable Medical Equipment Choice offered to:  Patient  DME Arranged:   N/A DME Agency:   N/A  HH Arranged:  PT, OT HH Agency:   N/A  Status of Service: Completed  If discussed at [place] of Stay Meetings, dates discussed:    Additional Comments: 1444 12-17-17 Jacqlyn Krauss, RN, BSN Case Manager [phone] Plan will be to transition to SNF today- CSW following for disposition needs. No further needs from CM at this time.  Bethena Roys, RN 12/13/2017, 3:52 PM

## 2017-12-14 ENCOUNTER — Encounter (HOSPITAL_COMMUNITY): Payer: Self-pay | Admitting: *Deleted

## 2017-12-14 ENCOUNTER — Inpatient Hospital Stay (HOSPITAL_COMMUNITY): Payer: Medicare Other | Admitting: Anesthesiology

## 2017-12-14 ENCOUNTER — Encounter (HOSPITAL_COMMUNITY)
Admission: EM | Disposition: A | Discharge status: Skilled Nursing Facility | Payer: Self-pay | Attending: Internal Medicine

## 2017-12-14 ENCOUNTER — Inpatient Hospital Stay (HOSPITAL_COMMUNITY): Payer: Medicare Other

## 2017-12-14 DIAGNOSIS — I058 Other rheumatic mitral valve diseases: Secondary | ICD-10-CM

## 2017-12-14 DIAGNOSIS — I059 Rheumatic mitral valve disease, unspecified: Secondary | ICD-10-CM

## 2017-12-14 DIAGNOSIS — I330 Acute and subacute infective endocarditis: Secondary | ICD-10-CM

## 2017-12-14 DIAGNOSIS — R7881 Bacteremia: Secondary | ICD-10-CM

## 2017-12-14 HISTORY — PX: TEE WITHOUT CARDIOVERSION: SHX5443

## 2017-12-14 LAB — RENAL FUNCTION PANEL
Albumin: 2.4 g/dL — ABNORMAL LOW (ref 3.5–5.0)
Anion gap: 14 (ref 5–15)
BUN: 62 mg/dL — ABNORMAL HIGH (ref 6–20)
CO2: 25 mmol/L (ref 22–32)
Calcium: 8.3 mg/dL — ABNORMAL LOW (ref 8.9–10.3)
Chloride: 97 mmol/L — ABNORMAL LOW (ref 98–111)
Creatinine, Ser: 11.43 mg/dL — ABNORMAL HIGH (ref 0.61–1.24)
GFR calc Af Amer: 5 mL/min — ABNORMAL LOW (ref >60)
GFR calc non Af Amer: 5 mL/min — ABNORMAL LOW (ref >60)
Glucose, Bld: 77 mg/dL (ref 70–99)
Phosphorus: 5.5 mg/dL — ABNORMAL HIGH (ref 2.5–4.6)
Potassium: 5 mmol/L (ref 3.5–5.1)
Sodium: 136 mmol/L (ref 135–145)

## 2017-12-14 LAB — CBC
HCT: 29.9 % — ABNORMAL LOW (ref 39.0–52.0)
Hemoglobin: 9.1 g/dL — ABNORMAL LOW (ref 13.0–17.0)
MCH: 29 pg (ref 26.0–34.0)
MCHC: 30.4 g/dL (ref 30.0–36.0)
MCV: 95.2 fL (ref 80.0–100.0)
Platelets: 122 10*3/uL — ABNORMAL LOW (ref 150–400)
RBC: 3.14 MIL/uL — ABNORMAL LOW (ref 4.22–5.81)
RDW: 16.5 % — ABNORMAL HIGH (ref 11.5–15.5)
WBC: 4.5 10*3/uL (ref 4.0–10.5)
nRBC: 0 % (ref 0.0–0.2)

## 2017-12-14 SURGERY — ECHOCARDIOGRAM, TRANSESOPHAGEAL
Anesthesia: Monitor Anesthesia Care

## 2017-12-14 MED ORDER — HEPARIN SODIUM (PORCINE) 1000 UNIT/ML IJ SOLN
INTRAMUSCULAR | Status: AC
  Administered 2017-12-14: 6000 [IU]
  Filled 2017-12-14: qty 6

## 2017-12-14 MED ORDER — DOXERCALCIFEROL 4 MCG/2ML IV SOLN
INTRAVENOUS | Status: AC
  Administered 2017-12-14: 6 ug via INTRAVENOUS
  Filled 2017-12-14: qty 4

## 2017-12-14 MED ORDER — HEPARIN SODIUM (PORCINE) 1000 UNIT/ML IJ SOLN
INTRAMUSCULAR | Status: AC
  Filled 2017-12-14: qty 4

## 2017-12-14 MED ORDER — LIDOCAINE HCL (CARDIAC) PF 100 MG/5ML IV SOSY
PREFILLED_SYRINGE | INTRAVENOUS | Status: DC | PRN
  Administered 2017-12-14: 60 mg via INTRAVENOUS

## 2017-12-14 MED ORDER — PROPOFOL 500 MG/50ML IV EMUL
INTRAVENOUS | Status: DC | PRN
  Administered 2017-12-14: 200 ug/kg/min via INTRAVENOUS

## 2017-12-14 MED ORDER — PROMETHAZINE HCL 25 MG/ML IJ SOLN
INTRAMUSCULAR | Status: AC
  Administered 2017-12-14: 25 mg via INTRAVENOUS
  Filled 2017-12-14: qty 1

## 2017-12-14 MED ORDER — PHENYLEPHRINE 40 MCG/ML (10ML) SYRINGE FOR IV PUSH (FOR BLOOD PRESSURE SUPPORT)
PREFILLED_SYRINGE | INTRAVENOUS | Status: DC | PRN
  Administered 2017-12-14 (×5): 80 ug via INTRAVENOUS

## 2017-12-14 MED ORDER — SODIUM CHLORIDE 0.9 % IV SOLN: INTRAVENOUS | Status: DC

## 2017-12-14 NOTE — Transfer of Care (Signed)
Immediate Anesthesia Transfer of Care Note  Patient: [name]  Procedure(s) Performed: TRANSESOPHAGEAL ECHOCARDIOGRAM (TEE) (N/A )  Patient Location: Endoscopy Unit  Anesthesia Type:MAC  Level of Consciousness: awake  Airway & Oxygen Therapy: Patient Spontanous Breathing  Post-op Assessment: Report given to RN and Post -op Vital signs reviewed and stable  Post vital signs: Reviewed and stable  Last Vitals:  Vitals Value Taken Time  BP 87/40 12/14/2017  1:06 PM  Temp 36.6 C 12/14/2017  1:00 PM  Pulse 63 12/14/2017  1:07 PM  Resp 19 12/14/2017  1:07 PM  SpO2 99 % 12/14/2017  1:07 PM  Vitals shown include unvalidated device data.  Last Pain:  Vitals:   12/14/17 1300  TempSrc: Oral  PainSc: 0-No pain      Patients Stated Pain Goal: 3 (02/33/43 5686)  Complications: No apparent anesthesia complications

## 2017-12-14 NOTE — Progress Notes (Signed)
PT Cancellation Note  Patient Details Name: [name] MRN: [id number] DOB: 1965-04-25 Cancelled Treatment:    Reason Eval/Treat Not Completed: Other (comment).  Pt was attempted but is in HD then TEE this afternoon.  Follow up at another time.   Ramond Dial 12/14/2017, 5:40 PM   Mee Hives, PT MS Acute Rehab Dept. Number: [place] [facility] [place]

## 2017-12-14 NOTE — Interval H&P Note (Signed)
History and Physical Interval Note:  12/14/2017 12:22 PM  [name]  has presented today for surgery, with the diagnosis of BACTEREMIA  The various methods of treatment have been discussed with the patient and family. After consideration of risks, benefits and other options for treatment, the patient has consented to  Procedure(s): TRANSESOPHAGEAL ECHOCARDIOGRAM (TEE) (N/A) as a surgical intervention .  The patient's history has been reviewed, patient examined, no change in status, stable for surgery.  I have reviewed the patient's chart and labs.  Questions were answered to the patient's satisfaction.     Mertie Moores

## 2017-12-14 NOTE — CV Procedure (Signed)
    Transesophageal Echocardiogram Note  [name] [id number] 1965-03-20  Procedure: Transesophageal Echocardiogram Indications: fever, bacteremia   Procedure Details Consent: Obtained Time Out: Verified patient identification, verified procedure, site/side was marked, verified correct patient position, special equipment/implants available, Radiology Safety Procedures followed,  medications/allergies/relevent history reviewed, required imaging and test results available.  Performed  Medications:  During this procedure the patient is administered a Propofol drip - total of 297 mg for the procedure by CRNA  April   Left Ventrical:  Normal   Mitral Valve: there is a small mobile vegetation on the annulus .  Moderate - severe mitral annular calcification.   Mild MS   Aortic Valve: heavily calcified valve.   With restricted leaflet mobility .   Likely mild AS ( gradient was not obtained ) no vegetation   Tricuspid Valve: normal   Pulmonic Valve: normal   Left Atrium/ Left atrial appendage: no thrombi   Atrial septum: on ASD o rPFO by color doppler   Aorta: normal    Complications: No apparent complications Patient did tolerate procedure well.   Thayer Headings, Brooke Bonito., MD, [facility] 12/14/2017, 12:55 PM

## 2017-12-14 NOTE — Progress Notes (Signed)
Subjective: 3 Days Post-Op Procedure(s) (LRB): Left Great Toe Amputation (Left) Patient reports pain as mild.    Objective: Vital signs in last 24 hours: Temp:  [98.5 F (36.9 C)-99.6 F (37.6 C)] 98.8 F (37.1 C) (12/13 0642) Pulse Rate:  [67-78] 73 (12/13 0930) Resp:  [16-20] 16 (12/13 0642) BP: (100-135)/(43-74) 112/43 (12/13 0930) SpO2:  [97 %-98 %] 98 % (12/13 0642) Weight:  [124 kg] 124 kg (12/13 0642)  Intake/Output from previous day: 12/12 0701 - 12/13 0700 In: 640 [P.O.:610; I.V.:30] Out: 1 [Stool:1] Intake/Output this shift: No intake/output data recorded.  Recent Labs    12/12/17 0536 12/13/17 0351 12/14/17 0520  HGB 9.4* 9.3* 9.1*   Recent Labs    12/13/17 0351 12/14/17 0520  WBC 4.2 4.5  RBC 3.19* 3.14*  HCT 30.2* 29.9*  PLT 123* 122*   Recent Labs    12/13/17 0351 12/14/17 0520  NA 135 136  K 4.8 5.0  CL 97* 97*  CO2 24 25  BUN 46* 62*  CREATININE 8.66* 11.43*  GLUCOSE 87 77  CALCIUM 8.2* 8.3*   Recent Labs    12/13/17 0351  INR 1.14    Patient seen in HD unit. Reports minimal pain in the left foot. Operative dressings intact and without drainage. Wiggles residual toes and sensation intact to light touch.   Assessment/Plan: 3 Days Post-Op Procedure(s) (LRB): Left Great Toe Amputation (Left) POD 3- Left great toe amputation for osteomyelitis. MSSA Bacteremia- For TEE today.  Continue non weight bearing as much as possible.  Elevate as much as possible.  Do not change operative dressing.  Plan to see in the office next week if discharged.    [place]

## 2017-12-14 NOTE — Progress Notes (Signed)
PHARMACY CONSULT NOTE FOR:  OUTPATIENT  PARENTERAL ANTIBIOTIC THERAPY (OPAT)  Indication: MSSA Bacteremia Regimen: Cefazolin 2 g HD MWF End date: 01/19/2018  IV antibiotic discharge orders are pended. To discharging provider:  please sign these orders via discharge navigator,  Select New Orders & click on the button choice - Manage This Unsigned Work.    Levester Fresh, PharmD, BCPS, BCCCP Clinical Pharmacist [phone]  Please check AMION for all [place] numbers  12/14/2017 5:37 PM

## 2017-12-14 NOTE — Progress Notes (Signed)
PROGRESS NOTE    [name]  TDH:[id number] DOB: 04-27-1965 DOA: 12/07/2017 PCP: [name], MD   Brief Narrative:  HPI on 12/07/2017 by Dr. Merton Border [name]  is a 52 y.o. male, with past medical history significant for end-stage renal disease on hemodialysis who presented with 1 week history of not feeling well with the fever of 102.  Patient denies any chest pains or shortness of breath.  Patient reports a chronic cough.  Is complaining of left great toe pain and redness that has been getting worse.  He was in dialysis when he was found to be febrile at 101.5 and he was started on vancomycin and Fortaz.  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 In the emergency room patient was noted to be afebrile however his blood pressure was low.  His white blood cell count was 8.1 with a chest x-ray that did not show any acute changes and left foot x-ray that did not show any osteomyelitis.  Interim history Admitted for toe osteomyelitis and MSSA bacteremia.  Status post left first great toe amputation.  Nephrology also consulted for ESRD. 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  Small first IP joint effusion with marrow edema on either side of the joint concerning for septic arthritis and osteomyelitis. -Orthopedic surgery consulted and appreciated, s/p left first great toe amputation. Will follow up in the office in one week with Dr. Sharol Given. Operative dressing is not be changed. -Infectious disease consulted and appreciated-recommended TEE.  Pending results of TEE, would at least treat with 4 weeks of IV antibiotics -Cardiology consulted for  TEE- pending today -Patient was placed on vancomycin and ceftazidime, and has been transitioned to cefazolin -Echocardiogram was unremarkable for vegetation -PT recommended home health with wheelchair and rolling walker  Right flank pain tenderness -Status post right nephrostomy tube in the past -CT abdomen pelvis without acute findings -Right flank pain improving  ESRD on hemodialysis -Nephrology consulted and appreciated -Access issues, patient had difficulties with blood flow -Vascular surgery consulted and appreciated, plan for attempted [facility] exchange on 12/13/2017- HOWEVER it seems that patient's [facility] is operating at this time so [facility] exchange now canceled -plan for HD on 12/14/2017  Hyperkalemia -Patient given lokelma on 12/12/2017 -Resolved, continue to monitor BMP  Anemia of chronic disease -Patient will receive Aranesp -Hemoglobin currently 9.1 -Continue to monitor CBC  Hypothyroidism -Continue Synthroid  Chronic hypotension -Continue midodrine  PAD -Continue Plavix  Anxiety -Continue Ativan  DVT Prophylaxis  heparin  Code Status: Full  Family Communication: None at bedside  Disposition Plan: Admitted. Suspect home with home health when stable- likely within the 1-2 days. Currently pending TEE, and further ID recommendations.  Consultants Orthopedic surgery ID Nephrology Vascular surgery Cardiology  Procedures  Left great toe amputation Echocardiogram  Antibiotics   Anti-infectives (From admission, onward)   Start     Dose/Rate Route Frequency Ordered Stop   12/14/17 2200  ceFAZolin (ANCEF) IVPB 1 g/50 mL premix     1 g 100 mL/hr over 30 Minutes Intravenous Every 24 hours 12/12/17 1055     12/13/17 1500  ceFAZolin (ANCEF) IVPB 2g/100 mL premix  Status:  Discontinued    Note to Pharmacy:  Send with pt to OR   2 g 200 mL/hr over 30 Minutes Intravenous To Short Stay 12/12/17 0755 12/12/17 1053   12/13/17 1500  ceFAZolin (ANCEF) IVPB 1 g/50 mL  premix    Note to Pharmacy:  Send with pt to OR   1 g 100 mL/hr over 30 Minutes Intravenous To ShortStay Surgical 12/12/17 1021 12/14/17 1500   12/12/17 2200  ceFAZolin (ANCEF) IVPB 1 g/50 mL premix     1 g 100 mL/hr over 30 Minutes Intravenous Every 24 hours 12/11/17 1344 12/12/17 2212   12/11/17 0800  ceFAZolin (ANCEF) 3 g in dextrose 5 % 50 mL IVPB     3 g 100 mL/hr over 30 Minutes Intravenous To ShortStay Surgical 12/10/17 1508 12/11/17 0937   12/10/17 2000  cefTAZidime (FORTAZ) 2 g in sodium chloride 0.9 % 100 mL IVPB  Status:  Discontinued     2 g 200 mL/hr over 30 Minutes Intravenous Every M-W-F (2000) 12/07/17 2218 12/08/17 1029   12/10/17 1200  vancomycin (VANCOCIN) IVPB 1000 mg/200 mL premix  Status:  Discontinued     1,000 mg 200 mL/hr over 60 Minutes Intravenous Every M-W-F (Hemodialysis) 12/07/17 2218 12/08/17 1029   12/08/17 2200  ceFAZolin (ANCEF) IVPB 1 g/50 mL premix  Status:  Discontinued     1 g 100 mL/hr over 30 Minutes Intravenous Every 24 hours 12/08/17 1250 12/11/17 1344   12/08/17 2000  cefTAZidime (FORTAZ) 2 g in sodium chloride 0.9 % 100 mL IVPB  Status:  Discontinued     2 g 200 mL/hr over 30 Minutes Intravenous  Once 12/07/17 2218 12/08/17 1157   12/08/17 1200  vancomycin (VANCOCIN) IVPB 1000 mg/200 mL premix  Status:  Discontinued     1,000 mg 200 mL/hr over 60 Minutes Intravenous Every T-Th-Sa (Hemodialysis) 12/07/17 2218 12/08/17 1157   12/08/17 1100  ceFAZolin (ANCEF) IVPB 1 g/50 mL premix  Status:  Discontinued     1 g 100 mL/hr over 30 Minutes Intravenous Every 24 hours 12/08/17 1029 12/08/17 1250   12/08/17 0000  vancomycin (VANCOCIN) IVPB 1000 mg/200 mL premix  Status:  Discontinued     1,000 mg 200 mL/hr over 60 Minutes Intravenous Every M-W-F (Hemodialysis) 12/07/17 2216 12/07/17 2218   12/08/17 0000  cefTAZidime (FORTAZ) 2 g in sodium chloride 0.9 % 100 mL IVPB  Status:  Discontinued     2 g 200 mL/hr over 30 Minutes Intravenous Every M-W-F  (2000) 12/07/17 2216 12/07/17 2218   12/07/17 2215  cefTAZidime (FORTAZ) 2 g in sodium chloride 0.9 % 100 mL IVPB  Status:  Discontinued     2 g 200 mL/hr over 30 Minutes Intravenous Every M-W-F (2000) 12/07/17 2209 12/07/17 2216      Subjective:   [name] seen and examined today in hemodialysis.  Patient continues to be very upset and states he is not doing well.  He cannot elaborate on this.  States he is having issues with his family.  Denies current chest pain, shortness of breath, abdominal pain, nausea or vomiting, diarrhea or constipation, dizziness or headache.  Objective:   Vitals:   12/14/17 0900 12/14/17 0930 12/14/17 1000 12/14/17 1030  BP: 100/62 (!) 112/43 (!) 103/50 (!) 104/46  Pulse: 69 73 66 66  Resp:      Temp:      TempSrc:      SpO2:      Weight:  Height:        Intake/Output Summary (Last 24 hours) at 12/14/2017 1112 Last data filed at 12/14/2017 0201 Gross per 24 hour  Intake 640 ml  Output 1 ml  Net 639 ml   Filed Weights   12/12/17 1550 12/12/17 2104 12/14/17 0642  Weight: 122.3 kg 122.3 kg 124 kg   Exam  General: Well developed, well nourished, NAD, appears stated age  11: NCAT, mucous membranes moist.   Neck: Supple  Cardiovascular: S1 S2 auscultated, 3/6 SEM, RRR  Respiratory: Clear to auscultation bilaterally with equal chest rise  Abdomen: Soft, nontender, nondistended, + bowel sounds  Extremities: warm dry without cyanosis clubbing or edema.  Dressing on right foot  Neuro: AAOx3, nonfocal  Psych: Depressed mood and affect  Data Reviewed: I have personally reviewed following labs and imaging studies  CBC: Recent Labs  Lab 12/07/17 1115  12/10/17 0431 12/11/17 0433 12/12/17 0536 12/13/17 0351 12/14/17 0520  WBC 8.1   < > 4.9 4.9 4.6 4.2 4.5  NEUTROABS 7.4  --   --   --   --   --   --   HGB 10.9*   < > 9.7* 9.8* 9.4* 9.3* 9.1*  HCT 36.0*   < > 32.5* 32.6* 31.2* 30.2* 29.9*  MCV 96.3   < > 95.3 94.8 95.7  94.7 95.2  PLT 92*   < > 128* 136* 126* 123* 122*   < > = values in this interval not displayed.   Basic Metabolic Panel: Recent Labs  Lab 12/09/17 1241 12/10/17 0431 12/11/17 0433 12/12/17 0536 12/13/17 0351 12/14/17 0520  NA  --  135 137 134* 135 136  K  --  5.0 5.9* 6.5* 4.8 5.0  CL  --  95* 95* 96* 97* 97*  CO2  --  27 27 21* 24 25  GLUCOSE  --  90 81 74 87 77  BUN  --  67* 85* 92* 46* 62*  CREATININE  --  11.18* 12.92* 13.41* 8.66* 11.43*  CALCIUM  --  8.7* 9.3 8.4* 8.2* 8.3*  PHOS 4.2  --   --   --   --  5.5*   GFR: Estimated Creatinine Clearance: 10.1 mL/min (A) (by C-G formula based on SCr of 11.43 mg/dL (H)). Liver Function Tests: Recent Labs  Lab 12/07/17 1115 12/14/17 0520  AST 19  --   ALT 14  --   ALKPHOS 56  --   BILITOT 1.1  --   PROT 8.3*  --   ALBUMIN 3.0* 2.4*   No results for input(s): LIPASE, AMYLASE in the last 168 hours. No results for input(s): AMMONIA in the last 168 hours. Coagulation Profile: Recent Labs  Lab 12/07/17 1115 12/13/17 0351  INR 1.15 1.14   Cardiac Enzymes: Recent Labs  Lab 12/07/17 1243  TROPONINI 0.04*   BNP (last 3 results) No results for input(s): PROBNP in the last 8760 hours. HbA1C: No results for input(s): HGBA1C in the last 72 hours. CBG: No results for input(s): GLUCAP in the last 168 hours. Lipid Profile: No results for input(s): CHOL, HDL, LDLCALC, TRIG, CHOLHDL, LDLDIRECT in the last 72 hours. Thyroid Function Tests: No results for input(s): TSH, T4TOTAL, FREET4, T3FREE, THYROIDAB in the last 72 hours. Anemia Panel: No results for input(s): VITAMINB12, FOLATE, FERRITIN, TIBC, IRON, RETICCTPCT in the last 72 hours. Urine analysis:    Component Value Date/Time   COLORURINE RED (A) 07/31/2010 0410   APPEARANCEUR TURBID (A) 07/31/2010 0410   LABSPEC  1.044 (H) 07/31/2010 0410   PHURINE 6.5 07/31/2010 0410   GLUCOSEU 100 (A) 07/31/2010 0410   HGBUR LARGE (A) 07/31/2010 0410   BILIRUBINUR LARGE (A)  07/31/2010 0410   KETONESUR 40 (A) 07/31/2010 0410   PROTEINUR >300 (A) 07/31/2010 0410   UROBILINOGEN 4.0 (H) 07/31/2010 0410   NITRITE POSITIVE (A) 07/31/2010 0410   LEUKOCYTESUR LARGE (A) 07/31/2010 0410   Sepsis Labs: @LABRCNTIP (procalcitonin:4,lacticidven:4)  ) Recent Results (from the past 240 hour(s))  Culture, blood (Routine x 2)     Status: Abnormal   Collection Time: 12/07/17 11:15 AM  Result Value Ref Range Status   Specimen Description BLOOD RIGHT ANTECUBITAL  Final   Special Requests   Final    BOTTLES DRAWN AEROBIC AND ANAEROBIC Blood Culture adequate volume   Culture  Setup Time   Final    GRAM POSITIVE COCCI IN CLUSTERS IN BOTH AEROBIC AND ANAEROBIC BOTTLES CRITICAL RESULT CALLED TO, READ BACK BY AND VERIFIED WITH: [place], [place] [id number] FCP Performed at [facility], [place] [address] [place], [place] [address]    Culture STAPHYLOCOCCUS AUREUS (A)  Final   Report Status 12/10/2017 FINAL  Final   Organism ID, Bacteria STAPHYLOCOCCUS AUREUS  Final      Susceptibility   Staphylococcus aureus - MIC*    CIPROFLOXACIN <=0.5 SENSITIVE Sensitive     ERYTHROMYCIN <=0.25 SENSITIVE Sensitive     GENTAMICIN <=0.5 SENSITIVE Sensitive     OXACILLIN 0.5 SENSITIVE Sensitive     TETRACYCLINE <=1 SENSITIVE Sensitive     VANCOMYCIN <=0.5 SENSITIVE Sensitive     TRIMETH/SULFA <=10 SENSITIVE Sensitive     CLINDAMYCIN <=0.25 SENSITIVE Sensitive     RIFAMPIN <=0.5 SENSITIVE Sensitive     Inducible Clindamycin NEGATIVE Sensitive     * STAPHYLOCOCCUS AUREUS  Blood Culture ID Panel (Reflexed)     Status: Abnormal   Collection Time: 12/07/17 11:15 AM  Result Value Ref Range Status   Enterococcus species NOT DETECTED NOT DETECTED Final   Listeria monocytogenes NOT DETECTED NOT DETECTED Final   Staphylococcus species DETECTED (A) NOT DETECTED Final    Comment: CRITICAL RESULT CALLED TO, READ BACK BY AND VERIFIED WITH: PHARMD ESPADA, M [phone] [id number] FCP    Staphylococcus  aureus (BCID) DETECTED (A) NOT DETECTED Final    Comment: Methicillin (oxacillin) susceptible Staphylococcus aureus (MSSA). Preferred therapy is anti staphylococcal beta lactam antibiotic (Cefazolin or Nafcillin), unless clinically contraindicated. CRITICAL RESULT CALLED TO, READ BACK BY AND VERIFIED WITH: PHARMD ESPADA, M [phone] FCP    Methicillin resistance NOT DETECTED NOT DETECTED Final   Streptococcus species NOT DETECTED NOT DETECTED Final   Streptococcus agalactiae NOT DETECTED NOT DETECTED Final   Streptococcus pneumoniae NOT DETECTED NOT DETECTED Final   Streptococcus pyogenes NOT DETECTED NOT DETECTED Final   Acinetobacter baumannii NOT DETECTED NOT DETECTED Final   Enterobacteriaceae species NOT DETECTED NOT DETECTED Final   Enterobacter cloacae complex NOT DETECTED NOT DETECTED Final   Escherichia coli NOT DETECTED NOT DETECTED Final   Klebsiella oxytoca NOT DETECTED NOT DETECTED Final   Klebsiella pneumoniae NOT DETECTED NOT DETECTED Final   Proteus species NOT DETECTED NOT DETECTED Final   Serratia marcescens NOT DETECTED NOT DETECTED Final   Haemophilus influenzae NOT DETECTED NOT DETECTED Final   Neisseria meningitidis NOT DETECTED NOT DETECTED Final   Pseudomonas aeruginosa NOT DETECTED NOT DETECTED Final   Candida albicans NOT DETECTED NOT DETECTED Final   Candida glabrata NOT DETECTED NOT DETECTED Final  Candida krusei NOT DETECTED NOT DETECTED Final   Candida parapsilosis NOT DETECTED NOT DETECTED Final   Candida tropicalis NOT DETECTED NOT DETECTED Final    Comment: Performed at [facility], [place] [address] [place], [place] [address]  Culture, blood (Routine x 2)     Status: Abnormal   Collection Time: 12/07/17 11:17 AM  Result Value Ref Range Status   Specimen Description BLOOD BLOOD LEFT ARM  Final   Special Requests   Final    BOTTLES DRAWN AEROBIC AND ANAEROBIC Blood Culture adequate volume   Culture  Setup Time   Final    GRAM POSITIVE COCCI IN  CLUSTERS ANAEROBIC BOTTLE ONLY CRITICAL VALUE NOTED.  VALUE IS CONSISTENT WITH PREVIOUSLY REPORTED AND CALLED VALUE.    Culture (A)  Final    STAPHYLOCOCCUS AUREUS SUSCEPTIBILITIES PERFORMED ON PREVIOUS CULTURE WITHIN THE LAST 5 DAYS. Performed at [facility], [place] [address], [place], [place] [address]    Report Status 12/10/2017 FINAL  Final  MRSA PCR Screening     Status: None   Collection Time: 12/08/17 12:05 AM  Result Value Ref Range Status   MRSA by PCR NEGATIVE NEGATIVE Final    Comment:        The GeneXpert MRSA Assay (FDA approved for NASAL specimens only), is one component of a comprehensive MRSA colonization surveillance program. It is not intended to diagnose MRSA infection nor to guide or monitor treatment for MRSA infections. Performed at [facility], [place] [address], [place], [place] [address]   Culture, blood (Routine X 2) w Reflex to ID Panel     Status: None (Preliminary result)   Collection Time: 12/12/17  9:00 AM  Result Value Ref Range Status   Specimen Description BLOOD RIGHT HAND  Final   Special Requests   Final    BOTTLES DRAWN AEROBIC AND ANAEROBIC Blood Culture results may not be optimal due to an inadequate volume of blood received in culture bottles   Culture   Final    NO GROWTH 2 DAYS Performed at [facility], [place] [address], [place], [place] [address]    Report Status PENDING  Incomplete  Culture, blood (Routine X 2) w Reflex to ID Panel     Status: None (Preliminary result)   Collection Time: 12/12/17  9:15 AM  Result Value Ref Range Status   Specimen Description BLOOD RIGHT FOREARM  Final   Special Requests   Final    BOTTLES DRAWN AEROBIC AND ANAEROBIC Blood Culture results may not be optimal due to an inadequate volume of blood received in culture bottles   Culture   Final    NO GROWTH 2 DAYS Performed at [facility], [place] [address] [place], [place] [address]    Report Status PENDING  Incomplete       Radiology Studies: No results found.   Scheduled Meds: . heparin      . calcium acetate  2,668 mg Oral TID WC  . Chlorhexidine Gluconate Cloth  6 each Topical Q0600  . clopidogrel  75 mg Oral Daily  . darbepoetin (ARANESP) injection - DIALYSIS  40 mcg Intravenous Q Wed-HD  . docusate sodium  100 mg Oral BID  . doxercalciferol  6 mcg Intravenous Q M,W,F-HD  . famotidine  20 mg Oral QHS  . heparin      . heparin  5,000 Units Subcutaneous Q8H  .  HYDROmorphone (DILAUDID) injection  1-2 mg Intravenous Once  .  levothyroxine  125 mcg Oral QAC breakfast  . midodrine  5 mg Oral TID WC  . sodium chloride flush  3 mL Intravenous Q12H  . traZODone  100 mg Oral QHS  . zolpidem  10 mg Oral QHS   Continuous Infusions: . sodium chloride Stopped (12/08/17 1326)  . sodium chloride 10 mL/hr at 12/11/17 0825  . sodium chloride 10 mL/hr at 12/11/17 1216  .  ceFAZolin (ANCEF) IV    .  ceFAZolin (ANCEF) IV    . methocarbamol (ROBAXIN) IV       LOS: 7 days   Time Spent in minutes   30 minutes  Justina Bertini D.O. on 12/14/2017 at 11:12 AM  Between 7am to 7pm - Please see pager noted on [email or web address]  After 7pm go to [email or web address]  And look for the night coverage person covering for me after hours  [facility] Office  [phone]

## 2017-12-14 NOTE — Progress Notes (Signed)
INFECTIOUS DISEASE PROGRESS NOTE  ID: [name] is a 52 y.o. male with  Principal Problem:   MSSA bacteremia Active Problems:   End stage renal disease ([place])   Fever   Right flank pain   Left foot infection  Subjective: Concerned about "what the cardiologist found"  Abtx:  Anti-infectives (From admission, onward)   Start     Dose/Rate Route Frequency Ordered Stop   12/14/17 2200  ceFAZolin (ANCEF) IVPB 1 g/50 mL premix     1 g 100 mL/hr over 30 Minutes Intravenous Every 24 hours 12/12/17 1055     12/13/17 1500  ceFAZolin (ANCEF) IVPB 2g/100 mL premix  Status:  Discontinued    Note to Pharmacy:  Send with pt to OR   2 g 200 mL/hr over 30 Minutes Intravenous To Short Stay 12/12/17 0755 12/12/17 1053   12/13/17 1500  ceFAZolin (ANCEF) IVPB 1 g/50 mL premix    Note to Pharmacy:  Send with pt to OR   1 g 100 mL/hr over 30 Minutes Intravenous To ShortStay Surgical 12/12/17 1021 12/14/17 1500   12/12/17 2200  ceFAZolin (ANCEF) IVPB 1 g/50 mL premix     1 g 100 mL/hr over 30 Minutes Intravenous Every 24 hours 12/11/17 1344 12/12/17 2212   12/11/17 0800  ceFAZolin (ANCEF) 3 g in dextrose 5 % 50 mL IVPB     3 g 100 mL/hr over 30 Minutes Intravenous To ShortStay Surgical 12/10/17 1508 12/11/17 0937   12/10/17 2000  cefTAZidime (FORTAZ) 2 g in sodium chloride 0.9 % 100 mL IVPB  Status:  Discontinued     2 g 200 mL/hr over 30 Minutes Intravenous Every M-W-F (2000) 12/07/17 2218 12/08/17 1029   12/10/17 1200  vancomycin (VANCOCIN) IVPB 1000 mg/200 mL premix  Status:  Discontinued     1,000 mg 200 mL/hr over 60 Minutes Intravenous Every M-W-F (Hemodialysis) 12/07/17 2218 12/08/17 1029   12/08/17 2200  ceFAZolin (ANCEF) IVPB 1 g/50 mL premix  Status:  Discontinued     1 g 100 mL/hr over 30 Minutes Intravenous Every 24 hours 12/08/17 1250 12/11/17 1344   12/08/17 2000  cefTAZidime (FORTAZ) 2 g in sodium chloride 0.9 % 100 mL IVPB  Status:  Discontinued     2 g 200 mL/hr over 30  Minutes Intravenous  Once 12/07/17 2218 12/08/17 1157   12/08/17 1200  vancomycin (VANCOCIN) IVPB 1000 mg/200 mL premix  Status:  Discontinued     1,000 mg 200 mL/hr over 60 Minutes Intravenous Every T-Th-Sa (Hemodialysis) 12/07/17 2218 12/08/17 1157   12/08/17 1100  ceFAZolin (ANCEF) IVPB 1 g/50 mL premix  Status:  Discontinued     1 g 100 mL/hr over 30 Minutes Intravenous Every 24 hours 12/08/17 1029 12/08/17 1250   12/08/17 0000  vancomycin (VANCOCIN) IVPB 1000 mg/200 mL premix  Status:  Discontinued     1,000 mg 200 mL/hr over 60 Minutes Intravenous Every M-W-F (Hemodialysis) 12/07/17 2216 12/07/17 2218   12/08/17 0000  cefTAZidime (FORTAZ) 2 g in sodium chloride 0.9 % 100 mL IVPB  Status:  Discontinued     2 g 200 mL/hr over 30 Minutes Intravenous Every M-W-F (2000) 12/07/17 2216 12/07/17 2218   12/07/17 2215  cefTAZidime (FORTAZ) 2 g in sodium chloride 0.9 % 100 mL IVPB  Status:  Discontinued     2 g 200 mL/hr over 30 Minutes Intravenous Every M-W-F (2000) 12/07/17 2209 12/07/17 2216      Medications:  Scheduled: . calcium acetate  2,668 mg Oral TID WC  . Chlorhexidine Gluconate Cloth  6 each Topical Q0600  . clopidogrel  75 mg Oral Daily  . darbepoetin (ARANESP) injection - DIALYSIS  40 mcg Intravenous Q Wed-HD  . docusate sodium  100 mg Oral BID  . doxercalciferol  6 mcg Intravenous Q M,W,F-HD  . famotidine  20 mg Oral QHS  . heparin  5,000 Units Subcutaneous Q8H  .  HYDROmorphone (DILAUDID) injection  1-2 mg Intravenous Once  . levothyroxine  125 mcg Oral QAC breakfast  . midodrine  5 mg Oral TID WC  . sodium chloride flush  3 mL Intravenous Q12H  . traZODone  100 mg Oral QHS  . zolpidem  10 mg Oral QHS    Objective: Vital signs in last 24 hours: Temp:  [97.9 F (36.6 C)-99.6 F (37.6 C)] 99.1 F (37.3 C) (12/13 1710) Pulse Rate:  [64-78] 73 (12/13 1710) Resp:  [16-23] 20 (12/13 1710) BP: (62-135)/(29-74) 123/64 (12/13 1710) SpO2:  [93 %-100 %] 97 % (12/13  1710) Weight:  [121.8 kg-124 kg] 124 kg (12/13 1146)   General appearance: alert, cooperative and no distress Resp: clear to auscultation bilaterally Cardio: regular rate and rhythm and systolic murmur: systolic ejection 4/6, crescendo at 2nd right intercostal space GI: normal findings: bowel sounds normal and soft, non-tender Extremities: R groin HD cath. non-tender.   Lab Results Recent Labs    12/13/17 0351 12/14/17 0520  WBC 4.2 4.5  HGB 9.3* 9.1*  HCT 30.2* 29.9*  NA 135 136  K 4.8 5.0  CL 97* 97*  CO2 24 25  BUN 46* 62*  CREATININE 8.66* 11.43*   Liver Panel Recent Labs    12/14/17 0520  ALBUMIN 2.4*   Sedimentation Rate No results for input(s): ESRSEDRATE in the last 72 hours. C-Reactive Protein No results for input(s): CRP in the last 72 hours.  Microbiology: Recent Results (from the past 240 hour(s))  Culture, blood (Routine x 2)     Status: Abnormal   Collection Time: 12/07/17 11:15 AM  Result Value Ref Range Status   Specimen Description BLOOD RIGHT ANTECUBITAL  Final   Special Requests   Final    BOTTLES DRAWN AEROBIC AND ANAEROBIC Blood Culture adequate volume   Culture  Setup Time   Final    GRAM POSITIVE COCCI IN CLUSTERS IN BOTH AEROBIC AND ANAEROBIC BOTTLES CRITICAL RESULT CALLED TO, READ BACK BY AND VERIFIED WITH: [place], [place] [id number] FCP Performed at [facility], [place] [address], [place], [place] [address]    Culture STAPHYLOCOCCUS AUREUS (A)  Final   Report Status 12/10/2017 FINAL  Final   Organism ID, Bacteria STAPHYLOCOCCUS AUREUS  Final      Susceptibility   Staphylococcus aureus - MIC*    CIPROFLOXACIN <=0.5 SENSITIVE Sensitive     ERYTHROMYCIN <=0.25 SENSITIVE Sensitive     GENTAMICIN <=0.5 SENSITIVE Sensitive     OXACILLIN 0.5 SENSITIVE Sensitive     TETRACYCLINE <=1 SENSITIVE Sensitive     VANCOMYCIN <=0.5 SENSITIVE Sensitive     TRIMETH/SULFA <=10 SENSITIVE Sensitive     CLINDAMYCIN <=0.25 SENSITIVE Sensitive      RIFAMPIN <=0.5 SENSITIVE Sensitive     Inducible Clindamycin NEGATIVE Sensitive     * STAPHYLOCOCCUS AUREUS  Blood Culture ID Panel (Reflexed)     Status: Abnormal   Collection Time: 12/07/17 11:15 AM  Result Value Ref Range Status   Enterococcus species NOT DETECTED NOT DETECTED Final   Listeria monocytogenes NOT  DETECTED NOT DETECTED Final   Staphylococcus species DETECTED (A) NOT DETECTED Final    Comment: CRITICAL RESULT CALLED TO, READ BACK BY AND VERIFIED WITH: PHARMD ESPADA, M [id number] FCP    Staphylococcus aureus (BCID) DETECTED (A) NOT DETECTED Final    Comment: Methicillin (oxacillin) susceptible Staphylococcus aureus (MSSA). Preferred therapy is anti staphylococcal beta lactam antibiotic (Cefazolin or Nafcillin), unless clinically contraindicated. CRITICAL RESULT CALLED TO, READ BACK BY AND VERIFIED WITH: PHARMD ESPADA, M [id number] FCP    Methicillin resistance NOT DETECTED NOT DETECTED Final   Streptococcus species NOT DETECTED NOT DETECTED Final   Streptococcus agalactiae NOT DETECTED NOT DETECTED Final   Streptococcus pneumoniae NOT DETECTED NOT DETECTED Final   Streptococcus pyogenes NOT DETECTED NOT DETECTED Final   Acinetobacter baumannii NOT DETECTED NOT DETECTED Final   Enterobacteriaceae species NOT DETECTED NOT DETECTED Final   Enterobacter cloacae complex NOT DETECTED NOT DETECTED Final   Escherichia coli NOT DETECTED NOT DETECTED Final   Klebsiella oxytoca NOT DETECTED NOT DETECTED Final   Klebsiella pneumoniae NOT DETECTED NOT DETECTED Final   Proteus species NOT DETECTED NOT DETECTED Final   Serratia marcescens NOT DETECTED NOT DETECTED Final   Haemophilus influenzae NOT DETECTED NOT DETECTED Final   Neisseria meningitidis NOT DETECTED NOT DETECTED Final   Pseudomonas aeruginosa NOT DETECTED NOT DETECTED Final   Candida albicans NOT DETECTED NOT DETECTED Final   Candida glabrata NOT DETECTED NOT DETECTED Final   Candida krusei NOT DETECTED NOT DETECTED  Final   Candida parapsilosis NOT DETECTED NOT DETECTED Final   Candida tropicalis NOT DETECTED NOT DETECTED Final    Comment: Performed at [facility], [address], [place], [place] [address]  Culture, blood (Routine x 2)     Status: Abnormal   Collection Time: 12/07/17 11:17 AM  Result Value Ref Range Status   Specimen Description BLOOD BLOOD LEFT ARM  Final   Special Requests   Final    BOTTLES DRAWN AEROBIC AND ANAEROBIC Blood Culture adequate volume   Culture  Setup Time   Final    GRAM POSITIVE COCCI IN CLUSTERS ANAEROBIC BOTTLE ONLY CRITICAL VALUE NOTED.  VALUE IS CONSISTENT WITH PREVIOUSLY REPORTED AND CALLED VALUE.    Culture (A)  Final    STAPHYLOCOCCUS AUREUS SUSCEPTIBILITIES PERFORMED ON PREVIOUS CULTURE WITHIN THE LAST 5 DAYS. Performed at [facility], [place] [address], [place], [place] [address]    Report Status 12/10/2017 FINAL  Final  MRSA PCR Screening     Status: None   Collection Time: 12/08/17 12:05 AM  Result Value Ref Range Status   MRSA by PCR NEGATIVE NEGATIVE Final    Comment:        The GeneXpert MRSA Assay (FDA approved for NASAL specimens only), is one component of a comprehensive MRSA colonization surveillance program. It is not intended to diagnose MRSA infection nor to guide or monitor treatment for MRSA infections. Performed at [facility], [place] [address], [place], [place] [address]   Culture, blood (Routine X 2) w Reflex to ID Panel     Status: None (Preliminary result)   Collection Time: 12/12/17  9:00 AM  Result Value Ref Range Status   Specimen Description BLOOD RIGHT HAND  Final   Special Requests   Final    BOTTLES DRAWN AEROBIC AND ANAEROBIC Blood Culture results may not be optimal due to an inadequate volume of blood received in culture bottles   Culture   Final  NO GROWTH 2 DAYS Performed at [facility], [place] [address] [place], [place] [address]    Report Status PENDING  Incomplete  Culture, blood  (Routine X 2) w Reflex to ID Panel     Status: None (Preliminary result)   Collection Time: 12/12/17  9:15 AM  Result Value Ref Range Status   Specimen Description BLOOD RIGHT FOREARM  Final   Special Requests   Final    BOTTLES DRAWN AEROBIC AND ANAEROBIC Blood Culture results may not be optimal due to an inadequate volume of blood received in culture bottles   Culture   Final    NO GROWTH 2 DAYS Performed at [facility], [place] [address], [place], [place] [address]    Report Status PENDING  Incomplete    Studies/Results: No results found.   Assessment/Plan: MSSA bacteremia (12-6) MV IE ESRD  Total days of antibiotics: 7 (ancef)  Pt has endocarditis, he needs 6 weeks of IV anbx  Would continue ancef with HD.  Continue to follow his repeat BCx (ngtd).  He will need repeat BCx ~ 2 weeks after he completes his anbx to assess clearance given he has a tunneled catheter still in place.  opat order placed.  We are available as needed.    Allergies  Allergen Reactions  . Furadantin [Nitrofurantoin] Other (See Comments)    UNSPECIFIED REACTION   . Mandelamine [Methenamine] Other (See Comments)    UNSPECIFIED REACTION   . Noroxin [Norfloxacin] Other (See Comments)    UNSPECIFIED REACTION   . Carmine Nausea Only  . Contrast Media [Iodinated Diagnostic Agents] Nausea And Vomiting    Oral dye causes vomiting, IV dye is okay  . Hydrocodone Other (See Comments)    LIKELY NOT REACTION TO HYDROCODONE Pt states that after 3 weeks of taking this medication he will began to "twitch"  . Metrizamide Nausea And Vomiting    Oral dye causes vomiting, IV dye is okay  . Sulfa Antibiotics Cough    Childhood reaction - pt could not confirm that it was a cough  . Sulfur Cough    Childhood reaction - pt could not confirm that it was a cough    OPAT Orders Discharge antibiotics: ancefPer pharmacy protocol ancef  Duration: 42 days End Date: 01-19-2018  [facility] Care Per Protocol: no  PIC  Labs weekly while on IV antibiotics: _x_ CBC with differential __ BMP _x_ CMP __ CRP __ ESR __ Vancomycin trough __ CK   Fax weekly labs to [phone]  Clinic Follow Up Appt: ID in 6 weeks.           Bobby Rumpf MD, FACP Infectious Diseases (pager) [phone] [email or web address] 12/14/2017, 5:23 PM  LOS: 7 days

## 2017-12-14 NOTE — Progress Notes (Signed)
Pt with low blood pressure. Pt just had dialysis before he came down for TEE. April CRNA aware of blood pressure and is at patient bedside monitoring pressure. April Carter CRNA giving bolus of NS rest of his 500cc bag. Dr. Fransisco Beau is aware of blood pressure status. Conitnue to monitor.

## 2017-12-14 NOTE — H&P (View-Only) (Signed)
PROGRESS NOTE    [name]  FAO:[id number] DOB: February 16, 1965 DOA: 12/07/2017 PCP: [name], MD   Brief Narrative:  HPI on 12/07/2017 by Dr. Merton Border Aldrick Derrig  is a 52 y.o. male, with past medical history significant for end-stage renal disease on hemodialysis who presented with 1 week history of not feeling well with the fever of 102.  Patient denies any chest pains or shortness of breath.  Patient reports a chronic cough.  Is complaining of left great toe pain and redness that has been getting worse.  He was in dialysis when he was found to be febrile at 101.5 and he was started on vancomycin and Fortaz.  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 In the emergency room patient was noted to be afebrile however his blood pressure was low.  His white blood cell count was 8.1 with a chest x-ray that did not show any acute changes and left foot x-ray that did not show any osteomyelitis.  Interim history Admitted for toe osteomyelitis and MSSA bacteremia.  Status post left first great toe amputation.  Nephrology also consulted for ESRD. 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  Small first IP joint effusion with marrow edema on either side of the joint concerning for septic arthritis and osteomyelitis. -Orthopedic surgery consulted and appreciated, s/p left first great toe amputation. Will follow up in the office in one week with Dr. Sharol Given. Operative dressing is not be changed. -Infectious disease consulted and appreciated-recommended TEE.  Pending results of TEE, would at least treat with 4 weeks of IV antibiotics -Cardiology consulted for  TEE- pending today -Patient was placed on vancomycin and ceftazidime, and has been transitioned to cefazolin -Echocardiogram was unremarkable for vegetation -PT recommended home health with wheelchair and rolling walker  Right flank pain tenderness -Status post right nephrostomy tube in the past -CT abdomen pelvis without acute findings -Right flank pain improving  ESRD on hemodialysis -Nephrology consulted and appreciated -Access issues, patient had difficulties with blood flow -Vascular surgery consulted and appreciated, plan for attempted [facility] exchange on 12/13/2017- HOWEVER it seems that patient's [facility] is operating at this time so [facility] exchange now canceled -plan for HD on 12/14/2017  Hyperkalemia -Patient given lokelma on 12/12/2017 -Resolved, continue to monitor BMP  Anemia of chronic disease -Patient will receive Aranesp -Hemoglobin currently 9.1 -Continue to monitor CBC  Hypothyroidism -Continue Synthroid  Chronic hypotension -Continue midodrine  PAD -Continue Plavix  Anxiety -Continue Ativan  DVT Prophylaxis  heparin  Code Status: Full  Family Communication: None at bedside  Disposition Plan: Admitted. Suspect home with home health when stable- likely within the 1-2 days. Currently pending TEE, and further ID recommendations.  Consultants Orthopedic surgery ID Nephrology Vascular surgery Cardiology  Procedures  Left great toe amputation Echocardiogram  Antibiotics   Anti-infectives (From admission, onward)   Start     Dose/Rate Route Frequency Ordered Stop   12/14/17 2200  ceFAZolin (ANCEF) IVPB 1 g/50 mL premix     1 g 100 mL/hr over 30 Minutes Intravenous Every 24 hours 12/12/17 1055     12/13/17 1500  ceFAZolin (ANCEF) IVPB 2g/100 mL premix  Status:  Discontinued    Note to Pharmacy:  Send with pt to OR   2 g 200 mL/hr over 30 Minutes Intravenous To Short Stay 12/12/17 0755 12/12/17 1053   12/13/17 1500  ceFAZolin (ANCEF) IVPB 1 g/50 mL  premix    Note to Pharmacy:  Send with pt to OR   1 g 100 mL/hr over 30 Minutes Intravenous To ShortStay Surgical 12/12/17 1021 12/14/17 1500   12/12/17 2200  ceFAZolin (ANCEF) IVPB 1 g/50 mL premix     1 g 100 mL/hr over 30 Minutes Intravenous Every 24 hours 12/11/17 1344 12/12/17 2212   12/11/17 0800  ceFAZolin (ANCEF) 3 g in dextrose 5 % 50 mL IVPB     3 g 100 mL/hr over 30 Minutes Intravenous To ShortStay Surgical 12/10/17 1508 12/11/17 0937   12/10/17 2000  cefTAZidime (FORTAZ) 2 g in sodium chloride 0.9 % 100 mL IVPB  Status:  Discontinued     2 g 200 mL/hr over 30 Minutes Intravenous Every M-W-F (2000) 12/07/17 2218 12/08/17 1029   12/10/17 1200  vancomycin (VANCOCIN) IVPB 1000 mg/200 mL premix  Status:  Discontinued     1,000 mg 200 mL/hr over 60 Minutes Intravenous Every M-W-F (Hemodialysis) 12/07/17 2218 12/08/17 1029   12/08/17 2200  ceFAZolin (ANCEF) IVPB 1 g/50 mL premix  Status:  Discontinued     1 g 100 mL/hr over 30 Minutes Intravenous Every 24 hours 12/08/17 1250 12/11/17 1344   12/08/17 2000  cefTAZidime (FORTAZ) 2 g in sodium chloride 0.9 % 100 mL IVPB  Status:  Discontinued     2 g 200 mL/hr over 30 Minutes Intravenous  Once 12/07/17 2218 12/08/17 1157   12/08/17 1200  vancomycin (VANCOCIN) IVPB 1000 mg/200 mL premix  Status:  Discontinued     1,000 mg 200 mL/hr over 60 Minutes Intravenous Every T-Th-Sa (Hemodialysis) 12/07/17 2218 12/08/17 1157   12/08/17 1100  ceFAZolin (ANCEF) IVPB 1 g/50 mL premix  Status:  Discontinued     1 g 100 mL/hr over 30 Minutes Intravenous Every 24 hours 12/08/17 1029 12/08/17 1250   12/08/17 0000  vancomycin (VANCOCIN) IVPB 1000 mg/200 mL premix  Status:  Discontinued     1,000 mg 200 mL/hr over 60 Minutes Intravenous Every M-W-F (Hemodialysis) 12/07/17 2216 12/07/17 2218   12/08/17 0000  cefTAZidime (FORTAZ) 2 g in sodium chloride 0.9 % 100 mL IVPB  Status:  Discontinued     2 g 200 mL/hr over 30 Minutes Intravenous Every M-W-F  (2000) 12/07/17 2216 12/07/17 2218   12/07/17 2215  cefTAZidime (FORTAZ) 2 g in sodium chloride 0.9 % 100 mL IVPB  Status:  Discontinued     2 g 200 mL/hr over 30 Minutes Intravenous Every M-W-F (2000) 12/07/17 2209 12/07/17 2216      Subjective:   [name] seen and examined today in hemodialysis.  Patient continues to be very upset and states he is not doing well.  He cannot elaborate on this.  States he is having issues with his family.  Denies current chest pain, shortness of breath, abdominal pain, nausea or vomiting, diarrhea or constipation, dizziness or headache.  Objective:   Vitals:   12/14/17 0900 12/14/17 0930 12/14/17 1000 12/14/17 1030  BP: 100/62 (!) 112/43 (!) 103/50 (!) 104/46  Pulse: 69 73 66 66  Resp:      Temp:      TempSrc:      SpO2:      Weight:  Height:        Intake/Output Summary (Last 24 hours) at 12/14/2017 1112 Last data filed at 12/14/2017 0201 Gross per 24 hour  Intake 640 ml  Output 1 ml  Net 639 ml   Filed Weights   12/12/17 1550 12/12/17 2104 12/14/17 0642  Weight: 122.3 kg 122.3 kg 124 kg   Exam  General: Well developed, well nourished, NAD, appears stated age  53: NCAT, mucous membranes moist.   Neck: Supple  Cardiovascular: S1 S2 auscultated, 3/6 SEM, RRR  Respiratory: Clear to auscultation bilaterally with equal chest rise  Abdomen: Soft, nontender, nondistended, + bowel sounds  Extremities: warm dry without cyanosis clubbing or edema.  Dressing on right foot  Neuro: AAOx3, nonfocal  Psych: Depressed mood and affect  Data Reviewed: I have personally reviewed following labs and imaging studies  CBC: Recent Labs  Lab 12/07/17 1115  12/10/17 0431 12/11/17 0433 12/12/17 0536 12/13/17 0351 12/14/17 0520  WBC 8.1   < > 4.9 4.9 4.6 4.2 4.5  NEUTROABS 7.4  --   --   --   --   --   --   HGB 10.9*   < > 9.7* 9.8* 9.4* 9.3* 9.1*  HCT 36.0*   < > 32.5* 32.6* 31.2* 30.2* 29.9*  MCV 96.3   < > 95.3 94.8 95.7  94.7 95.2  PLT 92*   < > 128* 136* 126* 123* 122*   < > = values in this interval not displayed.   Basic Metabolic Panel: Recent Labs  Lab 12/09/17 1241 12/10/17 0431 12/11/17 0433 12/12/17 0536 12/13/17 0351 12/14/17 0520  NA  --  135 137 134* 135 136  K  --  5.0 5.9* 6.5* 4.8 5.0  CL  --  95* 95* 96* 97* 97*  CO2  --  27 27 21* 24 25  GLUCOSE  --  90 81 74 87 77  BUN  --  67* 85* 92* 46* 62*  CREATININE  --  11.18* 12.92* 13.41* 8.66* 11.43*  CALCIUM  --  8.7* 9.3 8.4* 8.2* 8.3*  PHOS 4.2  --   --   --   --  5.5*   GFR: Estimated Creatinine Clearance: 10.1 mL/min (A) (by C-G formula based on SCr of 11.43 mg/dL (H)). Liver Function Tests: Recent Labs  Lab 12/07/17 1115 12/14/17 0520  AST 19  --   ALT 14  --   ALKPHOS 56  --   BILITOT 1.1  --   PROT 8.3*  --   ALBUMIN 3.0* 2.4*   No results for input(s): LIPASE, AMYLASE in the last 168 hours. No results for input(s): AMMONIA in the last 168 hours. Coagulation Profile: Recent Labs  Lab 12/07/17 1115 12/13/17 0351  INR 1.15 1.14   Cardiac Enzymes: Recent Labs  Lab 12/07/17 1243  TROPONINI 0.04*   BNP (last 3 results) No results for input(s): PROBNP in the last 8760 hours. HbA1C: No results for input(s): HGBA1C in the last 72 hours. CBG: No results for input(s): GLUCAP in the last 168 hours. Lipid Profile: No results for input(s): CHOL, HDL, LDLCALC, TRIG, CHOLHDL, LDLDIRECT in the last 72 hours. Thyroid Function Tests: No results for input(s): TSH, T4TOTAL, FREET4, T3FREE, THYROIDAB in the last 72 hours. Anemia Panel: No results for input(s): VITAMINB12, FOLATE, FERRITIN, TIBC, IRON, RETICCTPCT in the last 72 hours. Urine analysis:    Component Value Date/Time   COLORURINE RED (A) 07/31/2010 0410   APPEARANCEUR TURBID (A) 07/31/2010 0410   LABSPEC  1.044 (H) 07/31/2010 0410   PHURINE 6.5 07/31/2010 0410   GLUCOSEU 100 (A) 07/31/2010 0410   HGBUR LARGE (A) 07/31/2010 0410   BILIRUBINUR LARGE (A)  07/31/2010 0410   KETONESUR 40 (A) 07/31/2010 0410   PROTEINUR >300 (A) 07/31/2010 0410   UROBILINOGEN 4.0 (H) 07/31/2010 0410   NITRITE POSITIVE (A) 07/31/2010 0410   LEUKOCYTESUR LARGE (A) 07/31/2010 0410   Sepsis Labs: @LABRCNTIP (procalcitonin:4,lacticidven:4)  ) Recent Results (from the past 240 hour(s))  Culture, blood (Routine x 2)     Status: Abnormal   Collection Time: 12/07/17 11:15 AM  Result Value Ref Range Status   Specimen Description BLOOD RIGHT ANTECUBITAL  Final   Special Requests   Final    BOTTLES DRAWN AEROBIC AND ANAEROBIC Blood Culture adequate volume   Culture  Setup Time   Final    GRAM POSITIVE COCCI IN CLUSTERS IN BOTH AEROBIC AND ANAEROBIC BOTTLES CRITICAL RESULT CALLED TO, READ BACK BY AND VERIFIED WITH: [place], [place] [id number] FCP Performed at [facility], [place] [address], [place], [place] [address]    Culture STAPHYLOCOCCUS AUREUS (A)  Final   Report Status 12/10/2017 FINAL  Final   Organism ID, Bacteria STAPHYLOCOCCUS AUREUS  Final      Susceptibility   Staphylococcus aureus - MIC*    CIPROFLOXACIN <=0.5 SENSITIVE Sensitive     ERYTHROMYCIN <=0.25 SENSITIVE Sensitive     GENTAMICIN <=0.5 SENSITIVE Sensitive     OXACILLIN 0.5 SENSITIVE Sensitive     TETRACYCLINE <=1 SENSITIVE Sensitive     VANCOMYCIN <=0.5 SENSITIVE Sensitive     TRIMETH/SULFA <=10 SENSITIVE Sensitive     CLINDAMYCIN <=0.25 SENSITIVE Sensitive     RIFAMPIN <=0.5 SENSITIVE Sensitive     Inducible Clindamycin NEGATIVE Sensitive     * STAPHYLOCOCCUS AUREUS  Blood Culture ID Panel (Reflexed)     Status: Abnormal   Collection Time: 12/07/17 11:15 AM  Result Value Ref Range Status   Enterococcus species NOT DETECTED NOT DETECTED Final   Listeria monocytogenes NOT DETECTED NOT DETECTED Final   Staphylococcus species DETECTED (A) NOT DETECTED Final    Comment: CRITICAL RESULT CALLED TO, READ BACK BY AND VERIFIED WITH: PHARMD ESPADA, M [phone] [id number] FCP    Staphylococcus  aureus (BCID) DETECTED (A) NOT DETECTED Final    Comment: Methicillin (oxacillin) susceptible Staphylococcus aureus (MSSA). Preferred therapy is anti staphylococcal beta lactam antibiotic (Cefazolin or Nafcillin), unless clinically contraindicated. CRITICAL RESULT CALLED TO, READ BACK BY AND VERIFIED WITH: PHARMD ESPADA, M [phone] [id number] FCP    Methicillin resistance NOT DETECTED NOT DETECTED Final   Streptococcus species NOT DETECTED NOT DETECTED Final   Streptococcus agalactiae NOT DETECTED NOT DETECTED Final   Streptococcus pneumoniae NOT DETECTED NOT DETECTED Final   Streptococcus pyogenes NOT DETECTED NOT DETECTED Final   Acinetobacter baumannii NOT DETECTED NOT DETECTED Final   Enterobacteriaceae species NOT DETECTED NOT DETECTED Final   Enterobacter cloacae complex NOT DETECTED NOT DETECTED Final   Escherichia coli NOT DETECTED NOT DETECTED Final   Klebsiella oxytoca NOT DETECTED NOT DETECTED Final   Klebsiella pneumoniae NOT DETECTED NOT DETECTED Final   Proteus species NOT DETECTED NOT DETECTED Final   Serratia marcescens NOT DETECTED NOT DETECTED Final   Haemophilus influenzae NOT DETECTED NOT DETECTED Final   Neisseria meningitidis NOT DETECTED NOT DETECTED Final   Pseudomonas aeruginosa NOT DETECTED NOT DETECTED Final   Candida albicans NOT DETECTED NOT DETECTED Final   Candida glabrata NOT DETECTED NOT DETECTED Final  Candida krusei NOT DETECTED NOT DETECTED Final   Candida parapsilosis NOT DETECTED NOT DETECTED Final   Candida tropicalis NOT DETECTED NOT DETECTED Final    Comment: Performed at [facility], [place] [address] [place], [place] [address]  Culture, blood (Routine x 2)     Status: Abnormal   Collection Time: 12/07/17 11:17 AM  Result Value Ref Range Status   Specimen Description BLOOD BLOOD LEFT ARM  Final   Special Requests   Final    BOTTLES DRAWN AEROBIC AND ANAEROBIC Blood Culture adequate volume   Culture  Setup Time   Final    GRAM POSITIVE COCCI IN  CLUSTERS ANAEROBIC BOTTLE ONLY CRITICAL VALUE NOTED.  VALUE IS CONSISTENT WITH PREVIOUSLY REPORTED AND CALLED VALUE.    Culture (A)  Final    STAPHYLOCOCCUS AUREUS SUSCEPTIBILITIES PERFORMED ON PREVIOUS CULTURE WITHIN THE LAST 5 DAYS. Performed at [facility], [place] [address] [place], [place] [address]    Report Status 12/10/2017 FINAL  Final  MRSA PCR Screening     Status: None   Collection Time: 12/08/17 12:05 AM  Result Value Ref Range Status   MRSA by PCR NEGATIVE NEGATIVE Final    Comment:        The GeneXpert MRSA Assay (FDA approved for NASAL specimens only), is one component of a comprehensive MRSA colonization surveillance program. It is not intended to diagnose MRSA infection nor to guide or monitor treatment for MRSA infections. Performed at [facility], [place] [address] [place], [place] [address]   Culture, blood (Routine X 2) w Reflex to ID Panel     Status: None (Preliminary result)   Collection Time: 12/12/17  9:00 AM  Result Value Ref Range Status   Specimen Description BLOOD RIGHT HAND  Final   Special Requests   Final    BOTTLES DRAWN AEROBIC AND ANAEROBIC Blood Culture results may not be optimal due to an inadequate volume of blood received in culture bottles   Culture   Final    NO GROWTH 2 DAYS Performed at [facility], [place] [address], [place], [place] [address]    Report Status PENDING  Incomplete  Culture, blood (Routine X 2) w Reflex to ID Panel     Status: None (Preliminary result)   Collection Time: 12/12/17  9:15 AM  Result Value Ref Range Status   Specimen Description BLOOD RIGHT FOREARM  Final   Special Requests   Final    BOTTLES DRAWN AEROBIC AND ANAEROBIC Blood Culture results may not be optimal due to an inadequate volume of blood received in culture bottles   Culture   Final    NO GROWTH 2 DAYS Performed at [facility], [place] [address], [place], [place] [address]    Report Status PENDING  Incomplete       Radiology Studies: No results found.   Scheduled Meds: . heparin      . calcium acetate  2,668 mg Oral TID WC  . Chlorhexidine Gluconate Cloth  6 each Topical Q0600  . clopidogrel  75 mg Oral Daily  . darbepoetin (ARANESP) injection - DIALYSIS  40 mcg Intravenous Q Wed-HD  . docusate sodium  100 mg Oral BID  . doxercalciferol  6 mcg Intravenous Q M,W,F-HD  . famotidine  20 mg Oral QHS  . heparin      . heparin  5,000 Units Subcutaneous Q8H  .  HYDROmorphone (DILAUDID) injection  1-2 mg Intravenous Once  .  levothyroxine  125 mcg Oral QAC breakfast  . midodrine  5 mg Oral TID WC  . sodium chloride flush  3 mL Intravenous Q12H  . traZODone  100 mg Oral QHS  . zolpidem  10 mg Oral QHS   Continuous Infusions: . sodium chloride Stopped (12/08/17 1326)  . sodium chloride 10 mL/hr at 12/11/17 0825  . sodium chloride 10 mL/hr at 12/11/17 1216  .  ceFAZolin (ANCEF) IV    .  ceFAZolin (ANCEF) IV    . methocarbamol (ROBAXIN) IV       LOS: 7 days   Time Spent in minutes   30 minutes  Omarie Parcell D.O. on 12/14/2017 at 11:12 AM  Between 7am to 7pm - Please see pager noted on [email or web address]  After 7pm go to [email or web address]  And look for the night coverage person covering for me after hours  [facility] Office  [phone]

## 2017-12-14 NOTE — Progress Notes (Signed)
*  PRELIMINARY RESULTS* Echocardiogram 2D Echocardiogram has been performed.  [name] L Androw 12/14/2017, 1:44 PM

## 2017-12-14 NOTE — Anesthesia Preprocedure Evaluation (Addendum)
Anesthesia Evaluation  Patient identified by MRN, date of birth, ID band Patient awake    Reviewed: Allergy & Precautions, NPO status , Patient's Chart, lab work & pertinent test results  History of Anesthesia Complications Negative for: history of anesthetic complications  Airway Mallampati: III  TM Distance: >3 FB Neck ROM: Full    Dental  (+) Dental Advisory Given   Pulmonary neg pulmonary ROS,    breath sounds clear to auscultation       Cardiovascular hypertension, + Valvular Problems/Murmurs  Rhythm:Regular Rate:Normal   '19 TTE - Mild concentric hypertrophy. EF 60% to 65%. There was dynamic obstruction at rest in the mid cavity, with a peak velocity of 231 cm/sec and a peak gradient of 21 mm Hg.  Grade 1 diastolic dysfunction.A bicuspid AV morphology cannot be excluded; moderate AS. Peak velocity (S): 310 cm/s. Mean gradient (S): 22 mm Hg. Valve area (VTI): 2.2 cm^2. Valve area (Vmax): 1.97   cm^2. Valve area (Vmean): 1.82 cm^2. Trivial MR. Severely dilated LA. Trivial TR.     Neuro/Psych  Headaches, Seizures -, Well Controlled,  PSYCHIATRIC DISORDERS Anxiety  Spina bifida     GI/Hepatic GERD  Controlled and Medicated,(+)     substance abuse  ,   Endo/Other  Hypothyroidism  Obesity   Renal/GU ESRF and DialysisRenal disease     Musculoskeletal negative musculoskeletal ROS (+) narcotic dependent  Abdominal   Peds  Hematology  (+) anemia ,  Thrombocytopenia    Anesthesia Other Findings   Reproductive/Obstetrics                            Anesthesia Physical Anesthesia Plan  ASA: III  Anesthesia Plan: MAC   Post-op Pain Management:    Induction: Intravenous  PONV Risk Score and Plan: Propofol infusion and Treatment may vary due to age or medical condition  Airway Management Planned: Nasal Cannula and Natural Airway  Additional Equipment: None  Intra-op Plan:    Post-operative Plan:   Informed Consent: I have reviewed the patients History and Physical, chart, labs and discussed the procedure including the risks, benefits and alternatives for the proposed anesthesia with the patient or authorized representative who has indicated his/her understanding and acceptance.     Plan Discussed with: CRNA and Anesthesiologist  Anesthesia Plan Comments:        Anesthesia Quick Evaluation

## 2017-12-14 NOTE — Progress Notes (Addendum)
Subjective:   Seen on HD bfr 275  With [facility] cath  ,some foot discomfort,  For TEE today / thinks he can mange at home with help ,refuses NH   Objective Vital signs in last 24 hours: Vitals:   12/14/17 1340 12/14/17 1345 12/14/17 1350 12/14/17 1428  BP: (!) 116/46  (!) 119/53 116/62  Pulse: 73 73 73 72  Resp: 18 (!) 23 17 20   Temp:    98.8 F (37.1 C)  TempSrc:    Oral  SpO2: 99% 99% 99% 98%  Weight:      Height:       Weight change: -1 kg  Physical Exam General: ON HD , sleeping awoken  and NAD LGXQJ:JHE;1/7 systolicmurmur (heard best on L side) Lungs:CTAB anter. Abdomen:soft, non-tender/ nondistend  Extremities:L foot bandaged,traceLE edema Dialysis Access:TDC in R femoral/thigh bfr 4081 on HD   Dialysis Orders: MWF -AF 4.5h 450/800 122.5kg 2/2.25 bath R thigh TDC Hep 3700  -Parsabiv 5mg  qHD -Hectorol62mcg IV qHD  Problem/Plan: 1. MSSABacteremia: Presumed due toL foot osteomyelitis.On Cefazolin per pharm.TEE today ,TDC does not appear infected. HIS  HD  access options are extremely limited, plan was to try toclear infection without having to remove cath. ID consulting.  2. Foot Ulcer/LLE cellulitis:Osteomyelitisof L great toe, now s/p amputation 12/10.R flank wound near old nephrostomy site - CTabd showed no signs of infection. 3. ESRD: Continue HD per MWF schedule. Difficulties with blood flows in TDCwith prev HD,s/p multiple tPA dwell without much improvement. VVS consulted, plan was for attempted [facility] exchange on 12/12 with Dr. Donzetta Matters. Thankfully, TDC ran fine on 12/11 after repeat tPA dwell using 3mg  (as opposed to usual 2mg ) per lumen. Tolerating HD  ow  But 275 bfr  Follow up labs  As he usually is Hyperkalemic as OP (takes Kayexalate on weekend)  K 5.0  4. Hypotension/volume: BP chronically low, on midodrine for BP support. Follow. 5. Anemiaof CKD: Hgb 9.3>9.1 , no ESA as outpatient.Small dose(Aranesp 40)given 12/11. 6. Secondary  Hyperparathyroidism: CorrCa/Phos ok.Continue VDRA and binders. Unable to get parsabiv because not available in hospital 7. Nutrition -Currently on regular diet. Follow labs.Renavite. Adding pro-stat. 8. Hx seizures - per primary 9. GERD  Ernest Haber, PA-C [place] [phone] 12/14/2017,3:23 PM  LOS: 7 days   Pt seen, examined and agree w A/P as above.  TDC function is acceptable on HD today.  No new suggestions.  Stable for dc from renal standpoint.  [name] MD [facility] pager [phone]   12/14/2017, 4:11 PM    Labs: Basic Metabolic Panel: Recent Labs  Lab 12/09/17 1241  12/12/17 0536 12/13/17 0351 12/14/17 0520  NA  --    < > 134* 135 136  K  --    < > 6.5* 4.8 5.0  CL  --    < > 96* 97* 97*  CO2  --    < > 21* 24 25  GLUCOSE  --    < > 74 87 77  BUN  --    < > 92* 46* 62*  CREATININE  --    < > 13.41* 8.66* 11.43*  CALCIUM  --    < > 8.4* 8.2* 8.3*  PHOS 4.2  --   --   --  5.5*   < > = values in this interval not displayed.   Liver Function Tests: Recent Labs  Lab 12/14/17 0520  ALBUMIN 2.4*   No results for input(s): LIPASE, AMYLASE in  the last 168 hours. No results for input(s): AMMONIA in the last 168 hours. CBC: Recent Labs  Lab 12/10/17 0431 12/11/17 0433 12/12/17 0536 12/13/17 0351 12/14/17 0520  WBC 4.9 4.9 4.6 4.2 4.5  HGB 9.7* 9.8* 9.4* 9.3* 9.1*  HCT 32.5* 32.6* 31.2* 30.2* 29.9*  MCV 95.3 94.8 95.7 94.7 95.2  PLT 128* 136* 126* 123* 122*   Cardiac Enzymes: No results for input(s): CKTOTAL, CKMB, CKMBINDEX, TROPONINI in the last 168 hours. CBG: No results for input(s): GLUCAP in the last 168 hours.  Studies/Results: No results found. Medications: . sodium chloride 0 mL/hr at 12/08/17 1326  . sodium chloride 10 mL/hr at 12/11/17 0825  . sodium chloride 10 mL/hr at 12/11/17 1216  .  ceFAZolin (ANCEF) IV    . methocarbamol (ROBAXIN) IV     . calcium acetate  2,668 mg Oral TID WC   . Chlorhexidine Gluconate Cloth  6 each Topical Q0600  . clopidogrel  75 mg Oral Daily  . darbepoetin (ARANESP) injection - DIALYSIS  40 mcg Intravenous Q Wed-HD  . docusate sodium  100 mg Oral BID  . doxercalciferol  6 mcg Intravenous Q M,W,F-HD  . famotidine  20 mg Oral QHS  . heparin      . heparin      . heparin  5,000 Units Subcutaneous Q8H  .  HYDROmorphone (DILAUDID) injection  1-2 mg Intravenous Once  . levothyroxine  125 mcg Oral QAC breakfast  . midodrine  5 mg Oral TID WC  . sodium chloride flush  3 mL Intravenous Q12H  . traZODone  100 mg Oral QHS  . zolpidem  10 mg Oral QHS

## 2017-12-15 ENCOUNTER — Encounter (HOSPITAL_COMMUNITY): Payer: Self-pay | Admitting: Cardiovascular Disease

## 2017-12-15 LAB — RENAL FUNCTION PANEL
Albumin: 2.6 g/dL — ABNORMAL LOW (ref 3.5–5.0)
Anion gap: 14 (ref 5–15)
BUN: 43 mg/dL — ABNORMAL HIGH (ref 6–20)
CO2: 25 mmol/L (ref 22–32)
Calcium: 8.5 mg/dL — ABNORMAL LOW (ref 8.9–10.3)
Chloride: 96 mmol/L — ABNORMAL LOW (ref 98–111)
Creatinine, Ser: 9.11 mg/dL — ABNORMAL HIGH (ref 0.61–1.24)
GFR calc Af Amer: 7 mL/min — ABNORMAL LOW (ref >60)
GFR calc non Af Amer: 6 mL/min — ABNORMAL LOW (ref >60)
Glucose, Bld: 75 mg/dL (ref 70–99)
Phosphorus: 5.6 mg/dL — ABNORMAL HIGH (ref 2.5–4.6)
Potassium: 4.8 mmol/L (ref 3.5–5.1)
Sodium: 135 mmol/L (ref 135–145)

## 2017-12-15 LAB — HEMOGLOBIN AND HEMATOCRIT, BLOOD
HCT: 32.8 % — ABNORMAL LOW (ref 39.0–52.0)
Hemoglobin: 9.8 g/dL — ABNORMAL LOW (ref 13.0–17.0)

## 2017-12-15 MED ORDER — DARBEPOETIN ALFA 40 MCG/0.4ML IJ SOSY: 40.0000 ug | PREFILLED_SYRINGE | INTRAMUSCULAR | Status: DC

## 2017-12-15 MED ORDER — CEFAZOLIN SODIUM-DEXTROSE 2-4 GM/100ML-% IV SOLN
2.0000 g | INTRAVENOUS | Status: DC
  Filled 2017-12-15: qty 100

## 2017-12-15 NOTE — NC FL2 (Signed)
[place] LEVEL OF CARE SCREENING TOOL     IDENTIFICATION  Patient Name: Doctor Sheahan Birthdate: [birth year] Sex: male Admission Date (Current Location): 12/07/2017  [facility] and [place] Number:  Herbalist and Address:  [facility] [place], [place] [address], [place], [place] [address]      Provider Number: [id number]  Attending Physician Name and Address:  Cristal Ford, DO  Relative Name and Phone Number:  Leana Roe sister, [phone]    Current Level of Care: Hospital Recommended Level of Care: [place] Prior Approval Number:    Date Approved/Denied:   PASRR Number: [id number]  Discharge Plan: SNF    Current Diagnoses: Patient Active Problem List   Diagnosis Date Noted  . Endocarditis of mitral valve   . Left foot infection   . MSSA bacteremia 12/08/2017  . Cellulitis of toe, left   . Fever   . Right flank pain   . ESRD (end stage renal disease) ([place]) 09/20/2017  . ESRD (end stage renal disease) on dialysis ([place]) 08/07/2017  . Nonhealing surgical wound 02/29/2016  . Clotted dialysis access ([place]) 01/14/2016  . Removal of staples 07/19/2012  . Other complications due to renal dialysis device, implant, and graft 06/27/2012  . End stage renal disease ([place]) 06/27/2012    Orientation RESPIRATION BLADDER Height & Weight     Self, Time, Situation, Place  Normal Continent Weight: 124 kg Height:  5\' 11"  (180.3 cm)  BEHAVIORAL SYMPTOMS/MOOD NEUROLOGICAL BOWEL NUTRITION STATUS  (None)   Incontinent, Ileostomy Diet(Please see DC Summary)  AMBULATORY STATUS COMMUNICATION OF NEEDS Skin   Limited Assist Verbally Surgical wounds, Other (Comment)(Closed incision and non-pressure wound on foot;pinhole on back)                       Personal Care Assistance Level of Assistance  Bathing, Feeding, Dressing Bathing Assistance: Limited assistance Feeding assistance: Independent Dressing Assistance: Limited  assistance     Functional Limitations Info  Sight, Hearing, Speech Sight Info: Adequate Hearing Info: Adequate Speech Info: Adequate    SPECIAL CARE FACTORS FREQUENCY  PT (By licensed PT), OT (By licensed OT)     PT Frequency: 3x/week OT Frequency: 2x/week            Contractures Contractures Info: Not present    Additional Factors Info  Code Status, Allergies, Psychotropic Code Status Info: Full Allergies Info:  Furadantin Nitrofurantoin, Mandelamine Methenamine, Noroxin Norfloxacin, Carmine, Contrast Media Iodinated Diagnostic Agents, Hydrocodone, Metrizamide, Sulfa Antibiotics, Sulfur Psychotropic Info: Trazadone;Ambien         Current Medications (12/15/2017):  This is the current hospital active medication list Current Facility-Administered Medications  Medication Dose Route Frequency Provider Last Rate Last Dose  . 0.9 %  sodium chloride infusion  250 mL Intravenous PRN Nahser, Wonda Cheng, MD 0 mL/hr at 12/08/17 1326    . 0.9 %  sodium chloride infusion   Intravenous Continuous Nahser, Wonda Cheng, MD 10 mL/hr at 12/11/17 0825    . 0.9 %  sodium chloride infusion   Intravenous Continuous Nahser, Wonda Cheng, MD 10 mL/hr at 12/14/17 [phone]    . acetaminophen (TYLENOL) tablet 325-650 mg  325-650 mg Oral Q6H PRN Nahser, Wonda Cheng, MD   650 mg at 12/14/17 2232  . bisacodyl (DULCOLAX) suppository 10 mg  10 mg Rectal Daily PRN Nahser, Wonda Cheng, MD      . calcium acetate (PHOSLO) capsule 2,668 mg  2,668 mg Oral TID WC Nahser,  Wonda Cheng, MD   2,668 mg at 12/15/17 1201   And  . calcium acetate (PHOSLO) capsule 2,001 mg  2,001 mg Oral PRN Nahser, Wonda Cheng, MD      . Derrill Memo ON 12/17/2017] ceFAZolin (ANCEF) IVPB 2g/100 mL premix  2 g Intravenous Q M,W,F-2000 Mikhail, [place], DO      . Chlorhexidine Gluconate Cloth 2 % PADS 6 each  6 each Topical Q0600 Nahser, Wonda Cheng, MD   6 each at 12/15/17 [phone]  . clopidogrel (PLAVIX) tablet 75 mg  75 mg Oral Daily Nahser, Wonda Cheng, MD   75 mg at  12/15/17 3329  . Darbepoetin Alfa (ARANESP) injection 40 mcg  40 mcg Intravenous Q Wed-HD Nahser, Wonda Cheng, MD   40 mcg at 12/12/17 1544  . [START ON 12/19/2017] Darbepoetin Alfa (ARANESP) injection 40 mcg  40 mcg Intravenous Q Wed-HD Ernest Haber, PA-C      . docusate sodium (COLACE) capsule 100 mg  100 mg Oral BID Nahser, Wonda Cheng, MD   100 mg at 12/14/17 2232  . doxercalciferol (HECTOROL) injection 6 mcg  6 mcg Intravenous Q M,W,F-HD Nahser, Wonda Cheng, MD   6 mcg at 12/14/17 [phone]  . famotidine (PEPCID) tablet 20 mg  20 mg Oral QHS Nahser, Wonda Cheng, MD   20 mg at 12/14/17 2232  . fluticasone (FLONASE) 50 MCG/ACT nasal spray 2 spray  2 spray Each Nare PRN Nahser, Wonda Cheng, MD      . heparin injection 5,000 Units  5,000 Units Subcutaneous Q8H Nahser, Wonda Cheng, MD   5,000 Units at 12/15/17 0603  . HYDROcodone-acetaminophen (NORCO) 7.5-325 MG per tablet 1-2 tablet  1-2 tablet Oral Q4H PRN Nahser, Wonda Cheng, MD      . HYDROmorphone (DILAUDID) injection 1-2 mg  1-2 mg Intravenous Once Nahser, Wonda Cheng, MD      . levothyroxine (SYNTHROID, LEVOTHROID) tablet 125 mcg  125 mcg Oral QAC breakfast Nahser, Wonda Cheng, MD   125 mcg at 12/15/17 [phone]  . LORazepam (ATIVAN) injection 1 mg  1 mg Intravenous Q6H PRN Nahser, Wonda Cheng, MD   1 mg at 12/15/17 0844  . magnesium citrate solution 1 Bottle  1 Bottle Oral Once PRN Nahser, Wonda Cheng, MD      . methocarbamol (ROBAXIN) tablet 500 mg  500 mg Oral Q6H PRN Nahser, Wonda Cheng, MD       Or  . methocarbamol (ROBAXIN) 500 mg in dextrose 5 % 50 mL IVPB  500 mg Intravenous Q6H PRN Nahser, Wonda Cheng, MD      . metoCLOPramide (REGLAN) tablet 5-10 mg  5-10 mg Oral Q8H PRN Nahser, Wonda Cheng, MD       Or  . metoCLOPramide (REGLAN) injection 5-10 mg  5-10 mg Intravenous Q8H PRN Nahser, Wonda Cheng, MD      . midodrine (PROAMATINE) tablet 5 mg  5 mg Oral TID WC Nahser, Wonda Cheng, MD   5 mg at 12/14/17 1436  . morphine 2 MG/ML injection 0.5-1 mg  0.5-1 mg Intravenous Q2H PRN Nahser,  Wonda Cheng, MD   1 mg at 12/15/17 1204  . ondansetron (ZOFRAN) tablet 4 mg  4 mg Oral Q6H PRN Nahser, Wonda Cheng, MD       Or  . ondansetron [facility]) injection 4 mg  4 mg Intravenous Q6H PRN Nahser, Wonda Cheng, MD      . polyethylene glycol (MIRALAX / GLYCOLAX) packet 17 g  17 g Oral Daily PRN Nahser, Wonda Cheng, MD      .  promethazine (PHENERGAN) injection 25 mg  25 mg Intravenous Q6H PRN Nahser, Wonda Cheng, MD   25 mg at 12/14/17 4098  . sodium chloride flush (NS) 0.9 % injection 3 mL  3 mL Intravenous Q12H Nahser, Wonda Cheng, MD   3 mL at 12/14/17 1436  . sodium chloride flush (NS) 0.9 % injection 3 mL  3 mL Intravenous PRN Nahser, Wonda Cheng, MD   3 mL at 12/11/17 0800  . traZODone (DESYREL) tablet 100 mg  100 mg Oral QHS Nahser, Wonda Cheng, MD   100 mg at 12/14/17 2232  . zolpidem (AMBIEN) tablet 10 mg  10 mg Oral QHS Nahser, Wonda Cheng, MD   10 mg at 12/14/17 2232     Discharge Medications: Please see discharge summary for a list of discharge medications.  Relevant Imaging Results:  Relevant Lab Results:   Additional Information SSN: [id number]       Get Dialysis at [place]. Patient will need 6weeks of IV antibiotics (ANCEF) at HD (nephrology made aware)  Benard Halsted, LCSW

## 2017-12-15 NOTE — Clinical Social Work Note (Signed)
Clinical Social Work Assessment  Patient Details  Name: [name] MRN: [id number] Date of Birth: Jun 18, 1965  Date of referral:  12/15/17               Reason for consult:  Facility Placement                Permission sought to share information with:  Facility Sport and exercise psychologist, Family Supports Permission granted to share information::  Yes, Verbal Permission Granted  Name::     Nollie Terlizzi  Agency::  SNFs  Relationship::  Sister  Contact Information:  [phone]  Housing/Transportation Living arrangements for the past 2 months:  Apartment Source of Information:  Patient, Engineer, materials, Siblings Patient Interpreter Needed:  None Criminal Activity/Legal Involvement Pertinent to Current Situation/Hospitalization:  No - Comment as needed Significant Relationships:  Siblings, Friend Lives with:  Self Do you feel safe going back to the place where you live?  No Need for family participation in patient care:  Yes (Comment)  Care giving concerns:  CSW received consult for possible SNF placement at time of discharge. CSW spoke with patient, sister, and friends at bedside regarding request for SNF placement at time of discharge. Patient reported that patient's sister is currently unable to care for patient at home given patient's current physical needs and fall risk. Patient expressed understanding of PT recommendation and is agreeable to SNF placement at time of discharge. CSW to continue to follow and assist with discharge planning needs.   Social Worker assessment / plan:  CSW spoke with patient concerning possibility of rehab at [facility] before returning home.  Employment status:  Disabled (Comment on whether or not currently receiving Disability) Insurance information:  Medicare PT Recommendations:  Home with [place], 24 Hour Supervision Information / Referral to community resources:  [place]  Patient/Family's Response to care:  Patient recognizes  need for rehab before returning home and is agreeable to a SNF in [place]. Patient's sister reports that she helps patient make decisions and would like to be involved in the process. They report understanding that SNF will need to be able to transport patient to his dialysis center. CSW will send out referrals. Patient also requests a wheelchair and hospital bed, which CSW explained would be addressed after SNF rehab. Sister also requested resources for counseling for patient. CSW placed info on AVS.   Patient/Family's Understanding of and Emotional Response to Diagnosis, Current Treatment, and Prognosis:  Patient/family is realistic regarding therapy needs and expressed being hopeful for SNF placement. Patient expressed understanding of CSW role and discharge process as well as medical condition. No questions/concerns about plan or treatment.    Emotional Assessment Appearance:  Appears stated age Attitude/Demeanor/Rapport:  Engaged Affect (typically observed):  Accepting, Appropriate Orientation:  Oriented to Self, Oriented to Place, Oriented to  Time, Oriented to Situation Alcohol / Substance use:  Not Applicable Psych involvement (Current and /or in the community):  No (Comment)  Discharge Needs  Concerns to be addressed:  Care Coordination Readmission within the last 30 days:  No Current discharge risk:  Chronically ill, Dependent with Mobility Barriers to Discharge:  Continued Medical Work up   [employer], LCSW 12/15/2017, 1:13 PM

## 2017-12-15 NOTE — Progress Notes (Addendum)
Subjective:  HD on schedule yest. Some cramping at end, Sister in Room and now pt agrees with sister= main caregiver "can not manage at home secondary to his limitations with Spina Bifida thinks needs  rehab center at discharge "  Objective Vital signs in last 24 hours: Vitals:   12/14/17 2122 12/14/17 2351 12/15/17 0618 12/15/17 0905  BP: 131/74  (!) 93/55 99/68  Pulse: 79  64 75  Resp: 18  16 18   Temp: 100 F (37.8 C) 99.4 F (37.4 C) 98.5 F (36.9 C) 99.4 F (37.4 C)  TempSrc: Oral Oral Oral Oral  SpO2: 99%  98% 98%  Weight:      Height:       Weight change: -2.2 kg  Physical Exam General: Alert,  NAD Heart:RRR;3/6systolicmurmur (heard best on L side) Lungs:CTAB anter. Abdomen:soft, non-tender/ nondistend  Extremities:L foot bandaged,traceLE edema Dialysis Access:TDC in R femoral/  Dialysis Orders: MWF -AF 4.5h 450/800 122.5kg 2/2.25 bath R thigh TDC Hep 3700  -Parsabiv 5mg  qHD -Hectorol21mcg IV qHD  Problem/Plan: 1. MSSABacteremia: Presumed due toL foot osteomyelitis.On Cefazolin per pharm.Per ID needs 6 wks total  Ancef on HD,= Repeat BC x2  2 wks after completes ancef.l TEE today ,TDC does not appear infected.  2. 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 ESRD: Continue HD per MWF schedule. Difficulties with blood flows in TDCwithprior HD,s/p multiple tPA dwell without much improvement.after repeat tPA dwell using 3mg  (as opposed to usual 2mg ) per lumen. BFR  275 bfr tolerated  With K 4.8 today (on 1.0 k bath as op with ho ^ K and wknd kayexalate script"insur not covering po )  Follow up labs   4. Hypotension/volume: BP chronically low, on midodrine for BP support. Follow. 5. Anemiaof CKD: Hgb 9.3>9.1>9.8  , no ESA as outpatient.Small dose(Aranesp 40)given 12/11, continue on HD 12/18 6. Secondary  Hyperparathyroidism: CorrCa/Phos ok.Continue VDRA and binders. Unable to get parsabiv because not available in hospital 7. Nutrition -Currently on regular diet. Follow labs.Renavite. Adding pro-stat. 8. Hx seizures - per primary  9.   Deconditioning = Spina Bifida/ sp Amputation L grt toe = Today per pt and sister(caregiver at home )="needs [facility] "/ DC plans per admit .   Ernest Haber, PA-C [facility] Beeper [phone] 12/15/2017,10:54 AM  LOS: 8 days   Pt seen, examined and agree w A/P as above.  [name] MD [facility] pager [phone]   12/15/2017, 1:38 PM    Labs: Basic Metabolic Panel: Recent Labs  Lab 12/09/17 1241  12/13/17 0351 12/14/17 0520 12/15/17 0519  NA  --    < > 135 136 135  K  --    < > 4.8 5.0 4.8  CL  --    < > 97* 97* 96*  CO2  --    < > 24 25 25   GLUCOSE  --    < > 87 77 75  BUN  --    < > 46* 62* 43*  CREATININE  --    < > 8.66* 11.43* 9.11*  CALCIUM  --    < > 8.2* 8.3* 8.5*  PHOS 4.2  --   --  5.5* 5.6*   < > = values in this interval not displayed.   Liver Function Tests: Recent Labs  Lab 12/14/17 0520 12/15/17 0519  ALBUMIN 2.4* 2.6*   No results  for input(s): LIPASE, AMYLASE in the last 168 hours. No results for input(s): AMMONIA in the last 168 hours. CBC: Recent Labs  Lab 12/10/17 0431 12/11/17 0433 12/12/17 0536 12/13/17 0351 12/14/17 0520 12/15/17 0519  WBC 4.9 4.9 4.6 4.2 4.5  --   HGB 9.7* 9.8* 9.4* 9.3* 9.1* 9.8*  HCT 32.5* 32.6* 31.2* 30.2* 29.9* 32.8*  MCV 95.3 94.8 95.7 94.7 95.2  --   PLT 128* 136* 126* 123* 122*  --    Cardiac Enzymes: No results for input(s): CKTOTAL, CKMB, CKMBINDEX, TROPONINI in the last 168 hours. CBG: No results for input(s): GLUCAP in the last 168 hours.  Studies/Results: No results found. Medications: . sodium chloride 0 mL/hr at 12/08/17 1326  . sodium chloride 10 mL/hr at 12/11/17 0825  . sodium chloride 10 mL/hr at 12/14/17 0705  .  ceFAZolin  (ANCEF) IV 1 g (12/14/17 2240)  . methocarbamol (ROBAXIN) IV     . calcium acetate  2,668 mg Oral TID WC  . Chlorhexidine Gluconate Cloth  6 each Topical Q0600  . clopidogrel  75 mg Oral Daily  . darbepoetin (ARANESP) injection - DIALYSIS  40 mcg Intravenous Q Wed-HD  . docusate sodium  100 mg Oral BID  . doxercalciferol  6 mcg Intravenous Q M,W,F-HD  . famotidine  20 mg Oral QHS  . heparin  5,000 Units Subcutaneous Q8H  .  HYDROmorphone (DILAUDID) injection  1-2 mg Intravenous Once  . levothyroxine  125 mcg Oral QAC breakfast  . midodrine  5 mg Oral TID WC  . sodium chloride flush  3 mL Intravenous Q12H  . traZODone  100 mg Oral QHS  . zolpidem  10 mg Oral QHS

## 2017-12-15 NOTE — Progress Notes (Signed)
Occupational Therapy Treatment Patient Details Name: [name] MRN: [id number] DOB: Jun 18, 1965 Today's Date: 12/15/2017    History of present illness [name] is a 52 year old male with past medical history significant for end-stage renal disease on hemodialysis, peripheral artery disease, hypothyroidism, hypotension on midodrine, who presents with fevers and chills.  He admits to 1 week history of not feeling well, fever of 102 at home.  He has also been having left great toe pain and redness that has been getting worse in the past 2 weeks. L great toe osteomyelitis, now s/p amputation, TDWB; difficulty with HD access, plans for vascular procedure 12/12   OT comments  Pt. Able to complete bed mobility, LB dressing, and squat pivot transfer today min a.    Now stating "I may have said something that was misconstrued that could get my landlord in trouble. What I was saying was powered w/c not allowed and I agreed and signed a paper about that. I am totally fine with that.  Regular w/c just doesn't fit in the hall, but everything is fine.  When I crawl it is just because my mattress in on the floor and its just easier. When my foot heels I plan to walk".  Informed him I would pass along his updated report about home environment.     Follow Up Recommendations  Home health OT;Supervision/Assistance - 24 hour    Equipment Recommendations  Wheelchair (measurements OT);Wheelchair cushion (measurements OT);Other (comment) -pt. Requests a "different" post op shoe, and wants crutches.    Recommendations for Other Services      Precautions / Restrictions Precautions Precautions: Fall Precaution Comments: Pt reports long standing issues with balance Required Braces or Orthoses: Other Brace Other Brace: post-op shoe L foot       Mobility Bed Mobility Overal bed mobility: Needs Assistance Bed Mobility: Supine to Sit     Supine to sit: Supervision     General bed mobility  comments: Not needing physical assist, but minguard for safety as he was quite impulsive  Transfers Overall transfer level: Needs assistance Equipment used: None(refused, but asking for crutches) Transfers: Squat Pivot Transfers Sit to Stand: Min assist         General transfer comment: educated on squat pivot from eob to w/c (would also simulate use of bsc if needed). pt. refusing use of RW states "it throws off my balance", squat pivot safest option. pt. resistant to inst. and allowing therapist to have proper positioning "youre knee is touching me" explained that is the safe tech. he states it throws of his "spacial relationship" to his surroundings.  utilized gait belt to guide and inst. for hand placement on w/c to pull self over.  able to complete transfer with sequencing cues provided    Balance                                           ADL either performed or assessed with clinical judgement   ADL Overall ADL's : Needs assistance/impaired                     Lower Body Dressing: Set up;Sitting/lateral leans Lower Body Dressing Details (indicate cue type and reason): don and reposition sock  Toilet Transfer: Minimal assistance;Squat-pivot Toilet Transfer Details (indicate cue type and reason): simulated with eob to w/c         Functional  mobility during ADLs: Minimal assistance General ADL Comments: Pt presenting with increased impulsivity and poor awareness; feel this is baseline. Pt with unconventional techniques for ADLs and decreased awareness of safety.      Vision       Perception     Praxis      Cognition Arousal/Alertness: Awake/alert Behavior During Therapy: WFL for tasks assessed/performed Overall Cognitive Status: Within Functional Limits for tasks assessed Area of Impairment: Following commands;Safety/judgement                         Safety/Judgement: Decreased awareness of safety     General Comments: Pt with  decreased safety awareness and impulsive throughout session. With home set up and PLOF, pt not forthcoming with information. Pt also with alternating moods from frustration to laughter. Unsure of pt baselien and feel this is close to his baseline cognition        Exercises     Shoulder Instructions       General Comments      Pertinent Vitals/ Pain       Pain Intervention(s): RN gave pain meds during session  Home Living                                          Prior Functioning/Environment              Frequency  Min 2X/week        Progress Toward Goals  OT Goals(current goals can now be found in the care plan section)  Progress towards OT goals: Progressing toward goals     Plan      Co-evaluation                 AM-PAC OT "6 Clicks" Daily Activity     Outcome Measure   Help from another person eating meals?: None Help from another person taking care of personal grooming?: A Little Help from another person toileting, which includes using toliet, bedpan, or urinal?: A Little Help from another person bathing (including washing, rinsing, drying)?: A Little Help from another person to put on and taking off regular upper body clothing?: None Help from another person to put on and taking off regular lower body clothing?: A Little 6 Click Score: 20    End of Session Equipment Utilized During Treatment: Gait belt  OT Visit Diagnosis: Unsteadiness on feet (R26.81);Other abnormalities of gait and mobility (R26.89);Muscle weakness (generalized) (M62.81);Other symptoms and signs involving cognitive function;Pain Pain - Right/Left: Left Pain - part of body: Ankle and joints of foot   Activity Tolerance Patient tolerated treatment well   Patient Left in chair;with call bell/phone within reach   Nurse Communication          Time: 6294-7654 OT Time Calculation (min): 17 min  Charges: OT General Charges $OT Visit: 1 Visit OT  Treatments $Self Care/Home Management : 8-22 mins   Janice Coffin, COTA/L 12/15/2017, 9:16 AM

## 2017-12-15 NOTE — Progress Notes (Signed)
PROGRESS NOTE    [name]  XFG:[id number] DOB: May 07, 1965 DOA: 12/07/2017 PCP: [name], MD   Brief Narrative:  HPI on 12/07/2017 by Dr. Merton Border [name]  is a 52 y.o. male, with past medical history significant for end-stage renal disease on hemodialysis who presented with 1 week history of not feeling well with the fever of 102.  Patient denies any chest pains or shortness of breath.  Patient reports a chronic cough.  Is complaining of left great toe pain and redness that has been getting worse.  He was in dialysis when he was found to be febrile at 101.5 and he was started on vancomycin and Fortaz.  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 In the emergency room patient was noted to be afebrile however his blood pressure was low.  His white blood cell count was 8.1 with a chest x-ray that did not show any acute changes and left foot x-ray that did not show any osteomyelitis.  Interim history Admitted for toe osteomyelitis and MSSA bacteremia.  Status post left first great toe amputation.  Nephrology also consulted for ESRD. Assessment & Plan   Sepsis secondary to left toe osteomyelitis, MSSA bacteremia/Endocarditis  -Seems that patient had fever at the dialysis center as well as at home and was noted to have tach tachypnea when admitted -MRI revealed soft tissue wound involving the plantar aspect of the great toe extending to the IP joint.  Small first IP joint effusion with marrow edema on either side of the joint concerning for septic arthritis and osteomyelitis. -Orthopedic surgery consulted and appreciated, s/p left first great toe amputation. Will follow up in the office in one week with Dr. Sharol Given. Operative dressing is not be changed. -Infectious disease consulted and appreciated-recommended TEE.   -Cardiology consulted for TEE: +MV small mobile vegetation on the annulus -Patient will  need 6weeks of IV antibiotics (ANCEF) with HD (nephrology made aware) -Patient was placed on vancomycin and ceftazidime, and has been transitioned to cefazolin -Echocardiogram was unremarkable for vegetation -PT recommended home health with wheelchair and rolling walker -orders placed for equipment and [place]  (From admission, onward)         Start     Ordered   12/15/17 1130  For home use only DME lightweight manual wheelchair with seat cushion  Once    Comments:  Patient suffers from L great toe osteomyelitis, now s/p Great Toe amputation, which impairs their ability to perform daily activities like ambulation, transfer, general mobility, bathing in the home.  A walker alone will not resolve the issues with performing activities of daily living. A lightweight wheelchair will allow patient to safely perform daily activities.  The patient can self propel in the home or has a caregiver who can provide assistance   12/15/17 1130          Right flank pain tenderness -Status post right nephrostomy tube in the past -CT abdomen pelvis without acute findings -Right flank pain resolved  ESRD on hemodialysis -Nephrology consulted and appreciated -Access issues, patient had difficulties with blood flow -Vascular surgery consulted and appreciated, plan for attempted [facility] exchange on 12/13/2017- HOWEVER it seems that patient's [facility] is operating at this time so [facility] exchange now canceled -s/p HD 12/14/2017- next HD on 12/17/2017  Hyperkalemia -Patient given lokelma on 12/12/2017 -Resolved, continue to monitor BMP  Anemia of chronic disease -Patient  will receive Aranesp -Hemoglobin currently 9.8 -Continue to monitor CBC  Hypothyroidism -Continue Synthroid  Chronic hypotension -Continue midodrine  PAD -Continue Plavix  Anxiety -Continue Ativan  DVT Prophylaxis  heparin  Code Status: Full  Family Communication: None at bedside  Disposition Plan:  Admitted. Suspect home with home health when stable- on 12/16/17  Consultants Orthopedic surgery ID Nephrology Vascular surgery Cardiology  Procedures  Left great toe amputation Echocardiogram  Antibiotics   Anti-infectives (From admission, onward)   Start     Dose/Rate Route Frequency Ordered Stop   12/17/17 2000  ceFAZolin (ANCEF) IVPB 2g/100 mL premix     2 g 200 mL/hr over 30 Minutes Intravenous Every M-W-F (2000) 12/15/17 1114     12/14/17 2200  ceFAZolin (ANCEF) IVPB 1 g/50 mL premix  Status:  Discontinued     1 g 100 mL/hr over 30 Minutes Intravenous Every 24 hours 12/12/17 1055 12/15/17 1114   12/13/17 1500  ceFAZolin (ANCEF) IVPB 2g/100 mL premix  Status:  Discontinued    Note to Pharmacy:  Send with pt to OR   2 g 200 mL/hr over 30 Minutes Intravenous To Short Stay 12/12/17 0755 12/12/17 1053   12/13/17 1500  ceFAZolin (ANCEF) IVPB 1 g/50 mL premix    Note to Pharmacy:  Send with pt to OR   1 g 100 mL/hr over 30 Minutes Intravenous To ShortStay Surgical 12/12/17 1021 12/14/17 1500   12/12/17 2200  ceFAZolin (ANCEF) IVPB 1 g/50 mL premix     1 g 100 mL/hr over 30 Minutes Intravenous Every 24 hours 12/11/17 1344 12/12/17 2212   12/11/17 0800  ceFAZolin (ANCEF) 3 g in dextrose 5 % 50 mL IVPB     3 g 100 mL/hr over 30 Minutes Intravenous To ShortStay Surgical 12/10/17 1508 12/11/17 0937   12/10/17 2000  cefTAZidime (FORTAZ) 2 g in sodium chloride 0.9 % 100 mL IVPB  Status:  Discontinued     2 g 200 mL/hr over 30 Minutes Intravenous Every M-W-F (2000) 12/07/17 2218 12/08/17 1029   12/10/17 1200  vancomycin (VANCOCIN) IVPB 1000 mg/200 mL premix  Status:  Discontinued     1,000 mg 200 mL/hr over 60 Minutes Intravenous Every M-W-F (Hemodialysis) 12/07/17 2218 12/08/17 1029   12/08/17 2200  ceFAZolin (ANCEF) IVPB 1 g/50 mL premix  Status:  Discontinued     1 g 100 mL/hr over 30 Minutes Intravenous Every 24 hours 12/08/17 1250 12/11/17 1344   12/08/17 2000  cefTAZidime  (FORTAZ) 2 g in sodium chloride 0.9 % 100 mL IVPB  Status:  Discontinued     2 g 200 mL/hr over 30 Minutes Intravenous  Once 12/07/17 2218 12/08/17 1157   12/08/17 1200  vancomycin (VANCOCIN) IVPB 1000 mg/200 mL premix  Status:  Discontinued     1,000 mg 200 mL/hr over 60 Minutes Intravenous Every T-Th-Sa (Hemodialysis) 12/07/17 2218 12/08/17 1157   12/08/17 1100  ceFAZolin (ANCEF) IVPB 1 g/50 mL premix  Status:  Discontinued     1 g 100 mL/hr over 30 Minutes Intravenous Every 24 hours 12/08/17 1029 12/08/17 1250   12/08/17 0000  vancomycin (VANCOCIN) IVPB 1000 mg/200 mL premix  Status:  Discontinued     1,000 mg 200 mL/hr over 60 Minutes Intravenous Every M-W-F (Hemodialysis) 12/07/17 2216 12/07/17 2218   12/08/17 0000  cefTAZidime (FORTAZ) 2 g in sodium chloride 0.9 % 100 mL IVPB  Status:  Discontinued     2 g 200 mL/hr over 30 Minutes Intravenous Every M-W-F (2000)  12/07/17 2216 12/07/17 2218   12/07/17 2215  cefTAZidime (FORTAZ) 2 g in sodium chloride 0.9 % 100 mL IVPB  Status:  Discontinued     2 g 200 mL/hr over 30 Minutes Intravenous Every M-W-F (2000) 12/07/17 2209 12/07/17 2216      Subjective:   [name] seen and examined today.  Continues to complain of foot pain.  Would like to have a particular type of postop shoe and a new wheelchair.  Inquiring about rehab at a nursing facility and whether his insurance will cover this.  Currently denies chest pain, shortness breath, abdominal pain, nausea or vomiting, diarrhea or constipation, dizziness or headache.  Objective:   Vitals:   12/14/17 2122 12/14/17 2351 12/15/17 0618 12/15/17 0905  BP: 131/74  (!) 93/55 99/68  Pulse: 79  64 75  Resp: 18  16 18   Temp: 100 F (37.8 C) 99.4 F (37.4 C) 98.5 F (36.9 C) 99.4 F (37.4 C)  TempSrc: Oral Oral Oral Oral  SpO2: 99%  98% 98%  Weight:      Height:        Intake/Output Summary (Last 24 hours) at 12/15/2017 1130 Last data filed at 12/15/2017 0900 Gross per 24 hour   Intake 1141.36 ml  Output 0 ml  Net 1141.36 ml   Filed Weights   12/14/17 0642 12/14/17 1111 12/14/17 1146  Weight: 124 kg 121.8 kg 124 kg   Exam  General: Well developed, well nourished, NAD, appears stated age  79: NCAT, mucous membranes moist.   Neck: Supple  Cardiovascular: S1 S2 auscultated, 3/6 SEM, RRR  Respiratory: Clear to auscultation bilaterally with equal chest rise  Abdomen: Soft, nontender, nondistended, + bowel sounds  Extremities: warm dry without cyanosis clubbing or edema.  Dressing on right foot  Neuro: AAOx3, nonfocal  Psych: appropriate  Data Reviewed: I have personally reviewed following labs and imaging studies  CBC: Recent Labs  Lab 12/10/17 0431 12/11/17 0433 12/12/17 0536 12/13/17 0351 12/14/17 0520 12/15/17 0519  WBC 4.9 4.9 4.6 4.2 4.5  --   HGB 9.7* 9.8* 9.4* 9.3* 9.1* 9.8*  HCT 32.5* 32.6* 31.2* 30.2* 29.9* 32.8*  MCV 95.3 94.8 95.7 94.7 95.2  --   PLT 128* 136* 126* 123* 122*  --    Basic Metabolic Panel: Recent Labs  Lab 12/09/17 1241  12/11/17 0433 12/12/17 0536 12/13/17 0351 12/14/17 0520 12/15/17 0519  NA  --    < > 137 134* 135 136 135  K  --    < > 5.9* 6.5* 4.8 5.0 4.8  CL  --    < > 95* 96* 97* 97* 96*  CO2  --    < > 27 21* 24 25 25   GLUCOSE  --    < > 81 74 87 77 75  BUN  --    < > 85* 92* 46* 62* 43*  CREATININE  --    < > 12.92* 13.41* 8.66* 11.43* 9.11*  CALCIUM  --    < > 9.3 8.4* 8.2* 8.3* 8.5*  PHOS 4.2  --   --   --   --  5.5* 5.6*   < > = values in this interval not displayed.   GFR: Estimated Creatinine Clearance: 12.7 mL/min (A) (by C-G formula based on SCr of 9.11 mg/dL (H)). Liver Function Tests: Recent Labs  Lab 12/14/17 0520 12/15/17 0519  ALBUMIN 2.4* 2.6*   No results for input(s): LIPASE, AMYLASE in the last 168 hours. No results  for input(s): AMMONIA in the last 168 hours. Coagulation Profile: Recent Labs  Lab 12/13/17 0351  INR 1.14   Cardiac Enzymes: No results for  input(s): CKTOTAL, CKMB, CKMBINDEX, TROPONINI in the last 168 hours. BNP (last 3 results) No results for input(s): PROBNP in the last 8760 hours. HbA1C: No results for input(s): HGBA1C in the last 72 hours. CBG: No results for input(s): GLUCAP in the last 168 hours. Lipid Profile: No results for input(s): CHOL, HDL, LDLCALC, TRIG, CHOLHDL, LDLDIRECT in the last 72 hours. Thyroid Function Tests: No results for input(s): TSH, T4TOTAL, FREET4, T3FREE, THYROIDAB in the last 72 hours. Anemia Panel: No results for input(s): VITAMINB12, FOLATE, FERRITIN, TIBC, IRON, RETICCTPCT in the last 72 hours. Urine analysis:    Component Value Date/Time   COLORURINE RED (A) 07/31/2010 0410   APPEARANCEUR TURBID (A) 07/31/2010 0410   LABSPEC 1.044 (H) 07/31/2010 0410   PHURINE 6.5 07/31/2010 0410   GLUCOSEU 100 (A) 07/31/2010 0410   HGBUR LARGE (A) 07/31/2010 0410   BILIRUBINUR LARGE (A) 07/31/2010 0410   KETONESUR 40 (A) 07/31/2010 0410   PROTEINUR >300 (A) 07/31/2010 0410   UROBILINOGEN 4.0 (H) 07/31/2010 0410   NITRITE POSITIVE (A) 07/31/2010 0410   LEUKOCYTESUR LARGE (A) 07/31/2010 0410   Sepsis Labs: @LABRCNTIP (procalcitonin:4,lacticidven:4)  ) Recent Results (from the past 240 hour(s))  Culture, blood (Routine x 2)     Status: Abnormal   Collection Time: 12/07/17 11:15 AM  Result Value Ref Range Status   Specimen Description BLOOD RIGHT ANTECUBITAL  Final   Special Requests   Final    BOTTLES DRAWN AEROBIC AND ANAEROBIC Blood Culture adequate volume   Culture  Setup Time   Final    GRAM POSITIVE COCCI IN CLUSTERS IN BOTH AEROBIC AND ANAEROBIC BOTTLES CRITICAL RESULT CALLED TO, READ BACK BY AND VERIFIED WITH: [place], [place] [id number] FCP Performed at [facility], [place] [address], [place], [place] [address]    Culture STAPHYLOCOCCUS AUREUS (A)  Final   Report Status 12/10/2017 FINAL  Final   Organism ID, Bacteria STAPHYLOCOCCUS AUREUS  Final      Susceptibility    Staphylococcus aureus - MIC*    CIPROFLOXACIN <=0.5 SENSITIVE Sensitive     ERYTHROMYCIN <=0.25 SENSITIVE Sensitive     GENTAMICIN <=0.5 SENSITIVE Sensitive     OXACILLIN 0.5 SENSITIVE Sensitive     TETRACYCLINE <=1 SENSITIVE Sensitive     VANCOMYCIN <=0.5 SENSITIVE Sensitive     TRIMETH/SULFA <=10 SENSITIVE Sensitive     CLINDAMYCIN <=0.25 SENSITIVE Sensitive     RIFAMPIN <=0.5 SENSITIVE Sensitive     Inducible Clindamycin NEGATIVE Sensitive     * STAPHYLOCOCCUS AUREUS  Blood Culture ID Panel (Reflexed)     Status: Abnormal   Collection Time: 12/07/17 11:15 AM  Result Value Ref Range Status   Enterococcus species NOT DETECTED NOT DETECTED Final   Listeria monocytogenes NOT DETECTED NOT DETECTED Final   Staphylococcus species DETECTED (A) NOT DETECTED Final    Comment: CRITICAL RESULT CALLED TO, READ BACK BY AND VERIFIED WITH: PHARMD ESPADA, M [phone] [id number] FCP    Staphylococcus aureus (BCID) DETECTED (A) NOT DETECTED Final    Comment: Methicillin (oxacillin) susceptible Staphylococcus aureus (MSSA). Preferred therapy is anti staphylococcal beta lactam antibiotic (Cefazolin or Nafcillin), unless clinically contraindicated. CRITICAL RESULT CALLED TO, READ BACK BY AND VERIFIED WITH: [place], M [phone] [id number] FCP    Methicillin resistance NOT DETECTED NOT DETECTED Final   Streptococcus species NOT DETECTED NOT DETECTED  Final   Streptococcus agalactiae NOT DETECTED NOT DETECTED Final   Streptococcus pneumoniae NOT DETECTED NOT DETECTED Final   Streptococcus pyogenes NOT DETECTED NOT DETECTED Final   Acinetobacter baumannii NOT DETECTED NOT DETECTED Final   Enterobacteriaceae species NOT DETECTED NOT DETECTED Final   Enterobacter cloacae complex NOT DETECTED NOT DETECTED Final   Escherichia coli NOT DETECTED NOT DETECTED Final   Klebsiella oxytoca NOT DETECTED NOT DETECTED Final   Klebsiella pneumoniae NOT DETECTED NOT DETECTED Final   Proteus species NOT DETECTED NOT DETECTED Final     Serratia marcescens NOT DETECTED NOT DETECTED Final   Haemophilus influenzae NOT DETECTED NOT DETECTED Final   Neisseria meningitidis NOT DETECTED NOT DETECTED Final   Pseudomonas aeruginosa NOT DETECTED NOT DETECTED Final   Candida albicans NOT DETECTED NOT DETECTED Final   Candida glabrata NOT DETECTED NOT DETECTED Final   Candida krusei NOT DETECTED NOT DETECTED Final   Candida parapsilosis NOT DETECTED NOT DETECTED Final   Candida tropicalis NOT DETECTED NOT DETECTED Final    Comment: Performed at [facility], [place] [address] [place], [place] [address]  Culture, blood (Routine x 2)     Status: Abnormal   Collection Time: 12/07/17 11:17 AM  Result Value Ref Range Status   Specimen Description BLOOD BLOOD LEFT ARM  Final   Special Requests   Final    BOTTLES DRAWN AEROBIC AND ANAEROBIC Blood Culture adequate volume   Culture  Setup Time   Final    GRAM POSITIVE COCCI IN CLUSTERS ANAEROBIC BOTTLE ONLY CRITICAL VALUE NOTED.  VALUE IS CONSISTENT WITH PREVIOUSLY REPORTED AND CALLED VALUE.    Culture (A)  Final    STAPHYLOCOCCUS AUREUS SUSCEPTIBILITIES PERFORMED ON PREVIOUS CULTURE WITHIN THE LAST 5 DAYS. Performed at [facility], [place] [address] [place], [place] [address]    Report Status 12/10/2017 FINAL  Final  MRSA PCR Screening     Status: None   Collection Time: 12/08/17 12:05 AM  Result Value Ref Range Status   MRSA by PCR NEGATIVE NEGATIVE Final    Comment:        The GeneXpert MRSA Assay (FDA approved for NASAL specimens only), is one component of a comprehensive MRSA colonization surveillance program. It is not intended to diagnose MRSA infection nor to guide or monitor treatment for MRSA infections. Performed at [facility], [place] [address] [place], [place] [address]   Culture, blood (Routine X 2) w Reflex to ID Panel     Status: None (Preliminary result)   Collection Time: 12/12/17  9:00 AM  Result Value Ref Range Status   Specimen  Description BLOOD RIGHT HAND  Final   Special Requests   Final    BOTTLES DRAWN AEROBIC AND ANAEROBIC Blood Culture results may not be optimal due to an inadequate volume of blood received in culture bottles   Culture   Final    NO GROWTH 2 DAYS Performed at [facility], [place] [address] [place], [place] [address]    Report Status PENDING  Incomplete  Culture, blood (Routine X 2) w Reflex to ID Panel     Status: None (Preliminary result)   Collection Time: 12/12/17  9:15 AM  Result Value Ref Range Status   Specimen Description BLOOD RIGHT FOREARM  Final   Special Requests   Final    BOTTLES DRAWN AEROBIC AND ANAEROBIC Blood Culture results may not be optimal due to an inadequate volume of blood received in culture  bottles   Culture   Final    NO GROWTH 2 DAYS Performed at [facility], [place] [address] [place], [place] [address]    Report Status PENDING  Incomplete      Radiology Studies: No results found.   Scheduled Meds: . calcium acetate  2,668 mg Oral TID WC  . Chlorhexidine Gluconate Cloth  6 each Topical Q0600  . clopidogrel  75 mg Oral Daily  . darbepoetin (ARANESP) injection - DIALYSIS  40 mcg Intravenous Q Wed-HD  . docusate sodium  100 mg Oral BID  . doxercalciferol  6 mcg Intravenous Q M,W,F-HD  . famotidine  20 mg Oral QHS  . heparin  5,000 Units Subcutaneous Q8H  .  HYDROmorphone (DILAUDID) injection  1-2 mg Intravenous Once  . levothyroxine  125 mcg Oral QAC breakfast  . midodrine  5 mg Oral TID WC  . sodium chloride flush  3 mL Intravenous Q12H  . traZODone  100 mg Oral QHS  . zolpidem  10 mg Oral QHS   Continuous Infusions: . sodium chloride 0 mL/hr at 12/08/17 1326  . sodium chloride 10 mL/hr at 12/11/17 0825  . sodium chloride 10 mL/hr at 12/14/17 0705  . [START ON 12/17/2017]  ceFAZolin (ANCEF) IV    . methocarbamol (ROBAXIN) IV       LOS: 8 days   Time Spent in minutes   30 minutes  Teria Khachatryan D.O. on 12/15/2017 at 11:30  AM  Between 7am to 7pm - Please see pager noted on [email or web address]  After 7pm go to [email or web address]  And look for the night coverage person covering for me after hours  [facility] Office  [phone]

## 2017-12-16 MED ORDER — OXYCODONE-ACETAMINOPHEN 5-325 MG PO TABS
2.0000 | ORAL_TABLET | Freq: Four times a day (QID) | ORAL | Status: DC
  Administered 2017-12-16 – 2017-12-17 (×5): 2 via ORAL
  Filled 2017-12-16 (×5): qty 2

## 2017-12-16 MED ORDER — LORAZEPAM 1 MG PO TABS
1.0000 mg | ORAL_TABLET | Freq: Four times a day (QID) | ORAL | Status: DC | PRN
  Administered 2017-12-17: 1 mg via ORAL
  Filled 2017-12-16: qty 1

## 2017-12-16 NOTE — Progress Notes (Signed)
Notified by tech that patient's bp was soft, 88/57, patient is alert and oriented. Patient has been refusing midodrine, states is causes headaches and tinnitus. Patient also received morphine this morning. MD was notified. Will continue to monitor.

## 2017-12-16 NOTE — Progress Notes (Signed)
Had a long discussion with patient about his PRNs, blood pressure, home meds, and midodrine Ultimately patient states he doesn't care about morphine but is very agitated that his IV ativan was d/c'd. PO ativan was offered but he refused stating that "when you take pills, they get overburdened by other pills in your stomach" and only IV ativan works. Patient doesn't have any other requests at this time, will continue to monitor.

## 2017-12-16 NOTE — Progress Notes (Signed)
PROGRESS NOTE    [name]  WUJ:[id number] DOB: 1965-01-08 DOA: 12/07/2017 PCP: [name], MD   Brief Narrative:  HPI on 12/07/2017 by Dr. Merton Border [name]  is a 52 y.o. male, with past medical history significant for end-stage renal disease on hemodialysis who presented with 1 week history of not feeling well with the fever of 102.  Patient denies any chest pains or shortness of breath.  Patient reports a chronic cough.  Is complaining of left great toe pain and redness that has been getting worse.  He was in dialysis when he was found to be febrile at 101.5 and he was started on vancomycin and Fortaz.  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 In the emergency room patient was noted to be afebrile however his blood pressure was low.  His white blood cell count was 8.1 with a chest x-ray that did not show any acute changes and left foot x-ray that did not show any osteomyelitis.  Interim history Admitted for toe osteomyelitis and MSSA bacteremia.  Status post left first great toe amputation.  Nephrology also consulted for ESRD. Assessment & Plan   Sepsis secondary to left toe osteomyelitis, MSSA bacteremia/Endocarditis  -Seems that patient had fever at the dialysis center as well as at home and was noted to have tach tachypnea when admitted -MRI revealed soft tissue wound involving the plantar aspect of the great toe extending to the IP joint.  Small first IP joint effusion with marrow edema on either side of the joint concerning for septic arthritis and osteomyelitis. -Orthopedic surgery consulted and appreciated, s/p left first great toe amputation. Will follow up in the office in one week with Dr. Sharol Given. Operative dressing is not be changed. -Infectious disease consulted and appreciated-recommended TEE.   -Cardiology consulted for TEE: +MV small mobile vegetation on the annulus -Patient will  need 6weeks of IV antibiotics (ANCEF) with HD (nephrology made aware) -Patient was placed on vancomycin and ceftazidime, and has been transitioned to cefazolin -Echocardiogram was unremarkable for vegetation -PT recommended home health with wheelchair and rolling walker -orders placed for equipment and [place]  (From admission, onward)         Start     Ordered   12/15/17 1130  For home use only DME lightweight manual wheelchair with seat cushion  Once    Comments:  Patient suffers from L great toe osteomyelitis, now s/p Great Toe amputation, which impairs their ability to perform daily activities like ambulation, transfer, general mobility, bathing in the home.  A walker alone will not resolve the issues with performing activities of daily living. A lightweight wheelchair will allow patient to safely perform daily activities.  The patient can self propel in the home or has a caregiver who can provide assistance   12/15/17 1130        -Now wanting to go to SNF -Social work consulted -Continues to have pain in his foot and does not like Norco, states this causes him to twitch -will place him on percocet with morphine for breakthrough  Right flank pain tenderness -Status post right nephrostomy tube in the past -CT abdomen pelvis without acute findings -Right flank pain resolved  ESRD on hemodialysis -Nephrology consulted and appreciated -Access issues, patient had difficulties with blood flow -Vascular surgery consulted and appreciated, plan for attempted [facility] exchange on 12/13/2017- HOWEVER it seems that patient's [facility]  is operating at this time so [facility] exchange now canceled -s/p HD 12/14/2017- next HD on 12/17/2017  Hyperkalemia -Patient given lokelma on 12/12/2017 -Resolved, continue to monitor BMP  Anemia of chronic disease -Patient will receive Aranesp -Hemoglobin currently 9.8 -Continue to monitor CBC  Hypothyroidism -Continue  Synthroid  Chronic hypotension -Continue midodrine- has been refusing and states midodrine causes him to have headaches  PAD -Continue Plavix  Anxiety -Continue Ativan  DVT Prophylaxis  heparin  Code Status: Full  Family Communication: None at bedside  Disposition Plan: Admitted. Dispo pending- either SNF vs Home with [facility]  Consultants Orthopedic surgery ID Nephrology Vascular surgery Cardiology  Procedures  Left great toe amputation Echocardiogram  Antibiotics   Anti-infectives (From admission, onward)   Start     Dose/Rate Route Frequency Ordered Stop   12/17/17 2000  ceFAZolin (ANCEF) IVPB 2g/100 mL premix     2 g 200 mL/hr over 30 Minutes Intravenous Every M-W-F (2000) 12/15/17 1114     12/14/17 2200  ceFAZolin (ANCEF) IVPB 1 g/50 mL premix  Status:  Discontinued     1 g 100 mL/hr over 30 Minutes Intravenous Every 24 hours 12/12/17 1055 12/15/17 1114   12/13/17 1500  ceFAZolin (ANCEF) IVPB 2g/100 mL premix  Status:  Discontinued    Note to Pharmacy:  Send with pt to OR   2 g 200 mL/hr over 30 Minutes Intravenous To Short Stay 12/12/17 0755 12/12/17 1053   12/13/17 1500  ceFAZolin (ANCEF) IVPB 1 g/50 mL premix    Note to Pharmacy:  Send with pt to OR   1 g 100 mL/hr over 30 Minutes Intravenous To ShortStay Surgical 12/12/17 1021 12/14/17 1500   12/12/17 2200  ceFAZolin (ANCEF) IVPB 1 g/50 mL premix     1 g 100 mL/hr over 30 Minutes Intravenous Every 24 hours 12/11/17 1344 12/12/17 2212   12/11/17 0800  ceFAZolin (ANCEF) 3 g in dextrose 5 % 50 mL IVPB     3 g 100 mL/hr over 30 Minutes Intravenous To ShortStay Surgical 12/10/17 1508 12/11/17 0937   12/10/17 2000  cefTAZidime (FORTAZ) 2 g in sodium chloride 0.9 % 100 mL IVPB  Status:  Discontinued     2 g 200 mL/hr over 30 Minutes Intravenous Every M-W-F (2000) 12/07/17 2218 12/08/17 1029   12/10/17 1200  vancomycin (VANCOCIN) IVPB 1000 mg/200 mL premix  Status:  Discontinued     1,000 mg 200 mL/hr over 60  Minutes Intravenous Every M-W-F (Hemodialysis) 12/07/17 2218 12/08/17 1029   12/08/17 2200  ceFAZolin (ANCEF) IVPB 1 g/50 mL premix  Status:  Discontinued     1 g 100 mL/hr over 30 Minutes Intravenous Every 24 hours 12/08/17 1250 12/11/17 1344   12/08/17 2000  cefTAZidime (FORTAZ) 2 g in sodium chloride 0.9 % 100 mL IVPB  Status:  Discontinued     2 g 200 mL/hr over 30 Minutes Intravenous  Once 12/07/17 2218 12/08/17 1157   12/08/17 1200  vancomycin (VANCOCIN) IVPB 1000 mg/200 mL premix  Status:  Discontinued     1,000 mg 200 mL/hr over 60 Minutes Intravenous Every T-Th-Sa (Hemodialysis) 12/07/17 2218 12/08/17 1157   12/08/17 1100  ceFAZolin (ANCEF) IVPB 1 g/50 mL premix  Status:  Discontinued     1 g 100 mL/hr over 30 Minutes Intravenous Every 24 hours 12/08/17 1029 12/08/17 1250   12/08/17 0000  vancomycin (VANCOCIN) IVPB 1000 mg/200 mL premix  Status:  Discontinued     1,000 mg 200 mL/hr over 60  Minutes Intravenous Every M-W-F (Hemodialysis) 12/07/17 2216 12/07/17 2218   12/08/17 0000  cefTAZidime (FORTAZ) 2 g in sodium chloride 0.9 % 100 mL IVPB  Status:  Discontinued     2 g 200 mL/hr over 30 Minutes Intravenous Every M-W-F (2000) 12/07/17 2216 12/07/17 2218   12/07/17 2215  cefTAZidime (FORTAZ) 2 g in sodium chloride 0.9 % 100 mL IVPB  Status:  Discontinued     2 g 200 mL/hr over 30 Minutes Intravenous Every M-W-F (2000) 12/07/17 2209 12/07/17 2216      Subjective:   [name] seen and examined today.  Complain of foot pain.  States that he feels hydrocodone gives him twitches and would like to be back on his Percocet.  Currently denies chest pain, shortness of breath, abdominal pain, nausea or vomiting, diarrhea or constipation.  Feels that going to a nursing facility would be better for him and his family situation (as his sister does not feel she can care for him at home).  Objective:   Vitals:   12/15/17 1643 12/15/17 2052 12/16/17 0441 12/16/17 0957  BP: (!) 101/54  129/84 (!) 99/53 (!) 88/57  Pulse: 87 82 66 68  Resp: 18 18 18 18   Temp: 99.5 F (37.5 C) 99.5 F (37.5 C) 98 F (36.7 C) 98.1 F (36.7 C)  TempSrc: Oral Oral  Oral  SpO2: 99% 99% 98% 100%  Weight:   123.6 kg   Height:        Intake/Output Summary (Last 24 hours) at 12/16/2017 1023 Last data filed at 12/15/2017 1900 Gross per 24 hour  Intake 360 ml  Output -  Net 360 ml   Filed Weights   12/14/17 1111 12/14/17 1146 12/16/17 0441  Weight: 121.8 kg 124 kg 123.6 kg   Exam  General: Well developed, well nourished, NAD, appears stated age  63: NCAT,mucous membranes moist.   Neck: Supple  Cardiovascular: S1 S2 auscultated, 3/6SEM, RRR  Respiratory: Clear to auscultation bilaterally with equal chest rise  Abdomen: Soft, nontender, nondistended, + bowel sounds  Extremities: warm dry without cyanosis clubbing or edema. Right foot dressing in place  Neuro: AAOx3, nonfocal  Psych: Appropriate mood and affect  Data Reviewed: I have personally reviewed following labs and imaging studies  CBC: Recent Labs  Lab 12/10/17 0431 12/11/17 0433 12/12/17 0536 12/13/17 0351 12/14/17 0520 12/15/17 0519  WBC 4.9 4.9 4.6 4.2 4.5  --   HGB 9.7* 9.8* 9.4* 9.3* 9.1* 9.8*  HCT 32.5* 32.6* 31.2* 30.2* 29.9* 32.8*  MCV 95.3 94.8 95.7 94.7 95.2  --   PLT 128* 136* 126* 123* 122*  --    Basic Metabolic Panel: Recent Labs  Lab 12/09/17 1241  12/11/17 0433 12/12/17 0536 12/13/17 0351 12/14/17 0520 12/15/17 0519  NA  --    < > 137 134* 135 136 135  K  --    < > 5.9* 6.5* 4.8 5.0 4.8  CL  --    < > 95* 96* 97* 97* 96*  CO2  --    < > 27 21* 24 25 25   GLUCOSE  --    < > 81 74 87 77 75  BUN  --    < > 85* 92* 46* 62* 43*  CREATININE  --    < > 12.92* 13.41* 8.66* 11.43* 9.11*  CALCIUM  --    < > 9.3 8.4* 8.2* 8.3* 8.5*  PHOS 4.2  --   --   --   --  5.5* 5.6*   < > = values in this interval not displayed.   GFR: Estimated Creatinine Clearance: 12.7 mL/min (A) (by C-G  formula based on SCr of 9.11 mg/dL (H)). Liver Function Tests: Recent Labs  Lab 12/14/17 0520 12/15/17 0519  ALBUMIN 2.4* 2.6*   No results for input(s): LIPASE, AMYLASE in the last 168 hours. No results for input(s): AMMONIA in the last 168 hours. Coagulation Profile: Recent Labs  Lab 12/13/17 0351  INR 1.14   Cardiac Enzymes: No results for input(s): CKTOTAL, CKMB, CKMBINDEX, TROPONINI in the last 168 hours. BNP (last 3 results) No results for input(s): PROBNP in the last 8760 hours. HbA1C: No results for input(s): HGBA1C in the last 72 hours. CBG: No results for input(s): GLUCAP in the last 168 hours. Lipid Profile: No results for input(s): CHOL, HDL, LDLCALC, TRIG, CHOLHDL, LDLDIRECT in the last 72 hours. Thyroid Function Tests: No results for input(s): TSH, T4TOTAL, FREET4, T3FREE, THYROIDAB in the last 72 hours. Anemia Panel: No results for input(s): VITAMINB12, FOLATE, FERRITIN, TIBC, IRON, RETICCTPCT in the last 72 hours. Urine analysis:    Component Value Date/Time   COLORURINE RED (A) 07/31/2010 0410   APPEARANCEUR TURBID (A) 07/31/2010 0410   LABSPEC 1.044 (H) 07/31/2010 0410   PHURINE 6.5 07/31/2010 0410   GLUCOSEU 100 (A) 07/31/2010 0410   HGBUR LARGE (A) 07/31/2010 0410   BILIRUBINUR LARGE (A) 07/31/2010 0410   KETONESUR 40 (A) 07/31/2010 0410   PROTEINUR >300 (A) 07/31/2010 0410   UROBILINOGEN 4.0 (H) 07/31/2010 0410   NITRITE POSITIVE (A) 07/31/2010 0410   LEUKOCYTESUR LARGE (A) 07/31/2010 0410   Sepsis Labs: @LABRCNTIP (procalcitonin:4,lacticidven:4)  ) Recent Results (from the past 240 hour(s))  Culture, blood (Routine x 2)     Status: Abnormal   Collection Time: 12/07/17 11:15 AM  Result Value Ref Range Status   Specimen Description BLOOD RIGHT ANTECUBITAL  Final   Special Requests   Final    BOTTLES DRAWN AEROBIC AND ANAEROBIC Blood Culture adequate volume   Culture  Setup Time   Final    GRAM POSITIVE COCCI IN CLUSTERS IN BOTH AEROBIC AND  ANAEROBIC BOTTLES CRITICAL RESULT CALLED TO, READ BACK BY AND VERIFIED WITH: [place], [place] [id number] FCP Performed at [facility], [place] [address] [place], [place] [address]    Culture STAPHYLOCOCCUS AUREUS (A)  Final   Report Status 12/10/2017 FINAL  Final   Organism ID, Bacteria STAPHYLOCOCCUS AUREUS  Final      Susceptibility   Staphylococcus aureus - MIC*    CIPROFLOXACIN <=0.5 SENSITIVE Sensitive     ERYTHROMYCIN <=0.25 SENSITIVE Sensitive     GENTAMICIN <=0.5 SENSITIVE Sensitive     OXACILLIN 0.5 SENSITIVE Sensitive     TETRACYCLINE <=1 SENSITIVE Sensitive     VANCOMYCIN <=0.5 SENSITIVE Sensitive     TRIMETH/SULFA <=10 SENSITIVE Sensitive     CLINDAMYCIN <=0.25 SENSITIVE Sensitive     RIFAMPIN <=0.5 SENSITIVE Sensitive     Inducible Clindamycin NEGATIVE Sensitive     * STAPHYLOCOCCUS AUREUS  Blood Culture ID Panel (Reflexed)     Status: Abnormal   Collection Time: 12/07/17 11:15 AM  Result Value Ref Range Status   Enterococcus species NOT DETECTED NOT DETECTED Final   Listeria monocytogenes NOT DETECTED NOT DETECTED Final   Staphylococcus species DETECTED (A) NOT DETECTED Final    Comment: CRITICAL RESULT CALLED TO, READ BACK BY AND VERIFIED WITH: [place], M [id number] FCP    Staphylococcus aureus (BCID) DETECTED (  A) NOT DETECTED Final    Comment: Methicillin (oxacillin) susceptible Staphylococcus aureus (MSSA). Preferred therapy is anti staphylococcal beta lactam antibiotic (Cefazolin or Nafcillin), unless clinically contraindicated. CRITICAL RESULT CALLED TO, READ BACK BY AND VERIFIED WITH: PHARMD ESPADA, M [id number] FCP    Methicillin resistance NOT DETECTED NOT DETECTED Final   Streptococcus species NOT DETECTED NOT DETECTED Final   Streptococcus agalactiae NOT DETECTED NOT DETECTED Final   Streptococcus pneumoniae NOT DETECTED NOT DETECTED Final   Streptococcus pyogenes NOT DETECTED NOT DETECTED Final   Acinetobacter baumannii NOT DETECTED NOT  DETECTED Final   Enterobacteriaceae species NOT DETECTED NOT DETECTED Final   Enterobacter cloacae complex NOT DETECTED NOT DETECTED Final   Escherichia coli NOT DETECTED NOT DETECTED Final   Klebsiella oxytoca NOT DETECTED NOT DETECTED Final   Klebsiella pneumoniae NOT DETECTED NOT DETECTED Final   Proteus species NOT DETECTED NOT DETECTED Final   Serratia marcescens NOT DETECTED NOT DETECTED Final   Haemophilus influenzae NOT DETECTED NOT DETECTED Final   Neisseria meningitidis NOT DETECTED NOT DETECTED Final   Pseudomonas aeruginosa NOT DETECTED NOT DETECTED Final   Candida albicans NOT DETECTED NOT DETECTED Final   Candida glabrata NOT DETECTED NOT DETECTED Final   Candida krusei NOT DETECTED NOT DETECTED Final   Candida parapsilosis NOT DETECTED NOT DETECTED Final   Candida tropicalis NOT DETECTED NOT DETECTED Final    Comment: Performed at [facility], [address], [place], [place] [address]  Culture, blood (Routine x 2)     Status: Abnormal   Collection Time: 12/07/17 11:17 AM  Result Value Ref Range Status   Specimen Description BLOOD BLOOD LEFT ARM  Final   Special Requests   Final    BOTTLES DRAWN AEROBIC AND ANAEROBIC Blood Culture adequate volume   Culture  Setup Time   Final    GRAM POSITIVE COCCI IN CLUSTERS ANAEROBIC BOTTLE ONLY CRITICAL VALUE NOTED.  VALUE IS CONSISTENT WITH PREVIOUSLY REPORTED AND CALLED VALUE.    Culture (A)  Final    STAPHYLOCOCCUS AUREUS SUSCEPTIBILITIES PERFORMED ON PREVIOUS CULTURE WITHIN THE LAST 5 DAYS. Performed at [facility], [place] [address], [place], [place] [address]    Report Status 12/10/2017 FINAL  Final  MRSA PCR Screening     Status: None   Collection Time: 12/08/17 12:05 AM  Result Value Ref Range Status   MRSA by PCR NEGATIVE NEGATIVE Final    Comment:        The GeneXpert MRSA Assay (FDA approved for NASAL specimens only), is one component of a comprehensive MRSA colonization surveillance program. It is  not intended to diagnose MRSA infection nor to guide or monitor treatment for MRSA infections. Performed at [facility], [place] [address], [place], [place] [address]   Culture, blood (Routine X 2) w Reflex to ID Panel     Status: None (Preliminary result)   Collection Time: 12/12/17  9:00 AM  Result Value Ref Range Status   Specimen Description BLOOD RIGHT HAND  Final   Special Requests   Final    BOTTLES DRAWN AEROBIC AND ANAEROBIC Blood Culture results may not be optimal due to an inadequate volume of blood received in culture bottles   Culture   Final    NO GROWTH 3 DAYS Performed at [facility], [place] [address] [place], [place] [address]    Report Status PENDING  Incomplete  Culture, blood (Routine X 2) w Reflex to ID Panel  Status: None (Preliminary result)   Collection Time: 12/12/17  9:15 AM  Result Value Ref Range Status   Specimen Description BLOOD RIGHT FOREARM  Final   Special Requests   Final    BOTTLES DRAWN AEROBIC AND ANAEROBIC Blood Culture results may not be optimal due to an inadequate volume of blood received in culture bottles   Culture   Final    NO GROWTH 3 DAYS Performed at [facility], [place] [address] [place], [place] [address]    Report Status PENDING  Incomplete      Radiology Studies: No results found.   Scheduled Meds: . calcium acetate  2,668 mg Oral TID WC  . Chlorhexidine Gluconate Cloth  6 each Topical Q0600  . clopidogrel  75 mg Oral Daily  . darbepoetin (ARANESP) injection - DIALYSIS  40 mcg Intravenous Q Wed-HD  . [START ON 12/19/2017] darbepoetin (ARANESP) injection - DIALYSIS  40 mcg Intravenous Q Wed-HD  . docusate sodium  100 mg Oral BID  . doxercalciferol  6 mcg Intravenous Q M,W,F-HD  . famotidine  20 mg Oral QHS  . heparin  5,000 Units Subcutaneous Q8H  .  HYDROmorphone (DILAUDID) injection  1-2 mg Intravenous Once  . levothyroxine  125 mcg Oral QAC breakfast  . midodrine  5 mg Oral TID WC  .  oxyCODONE-acetaminophen  2 tablet Oral Q6H  . sodium chloride flush  3 mL Intravenous Q12H  . traZODone  100 mg Oral QHS  . zolpidem  10 mg Oral QHS   Continuous Infusions: . sodium chloride 0 mL/hr at 12/08/17 1326  . sodium chloride 10 mL/hr at 12/11/17 0825  . sodium chloride 10 mL/hr at 12/14/17 0705  . [START ON 12/17/2017]  ceFAZolin (ANCEF) IV    . methocarbamol (ROBAXIN) IV       LOS: 9 days   Time Spent in minutes   30 minutes  Delfin Squillace D.O. on 12/16/2017 at 10:23 AM  Between 7am to 7pm - Please see pager noted on [email or web address]  After 7pm go to [email or web address]  And look for the night coverage person covering for me after hours  [facility] Office  [phone]

## 2017-12-16 NOTE — Anesthesia Postprocedure Evaluation (Signed)
Anesthesia Post Note  Patient: [name]  Procedure(s) Performed: TRANSESOPHAGEAL ECHOCARDIOGRAM (TEE) (N/A )     Patient location during evaluation: PACU Anesthesia Type: MAC Level of consciousness: awake and alert Pain management: pain level controlled Vital Signs Assessment: post-procedure vital signs reviewed and stable Respiratory status: spontaneous breathing, nonlabored ventilation and respiratory function stable Cardiovascular status: stable and blood pressure returned to baseline Anesthetic complications: no    Last Vitals:  Vitals:   12/16/17 0441 12/16/17 0957  BP: (!) 99/53 (!) 88/57  Pulse: 66 68  Resp: 18 18  Temp: 36.7 C 36.7 C  SpO2: 98% 100%    Last Pain:  Vitals:   12/16/17 0957  TempSrc: Oral  PainSc:                  Audry Pili

## 2017-12-16 NOTE — Progress Notes (Addendum)
Subjective:  Minimal foot discomfort  Objective Vital signs in last 24 hours: Vitals:   12/15/17 1643 12/15/17 2052 12/16/17 0441 12/16/17 0957  BP: (!) 101/54 129/84 (!) 99/53 (!) 88/57  Pulse: 87 82 66 68  Resp: 18 18 18 18   Temp: 99.5 F (37.5 C) 99.5 F (37.5 C) 98 F (36.7 C) 98.1 F (36.7 C)  TempSrc: Oral Oral  Oral  SpO2: 99% 99% 98% 100%  Weight:   123.6 kg   Height:       Weight change: 1.8 kg  Physical Exam General:Alert,  NAD Heart:RRR;3/6systolicmurmur (heard best on L side) Lungs:CTABanter. Abdomen:soft, non-tender Extremities:L foot bandaged,traceLE edema Dialysis Access:TDC in R femoral/  Dialysis Orders: MWF -AF 4.5h 450/800 122.5kg 2/2.25 bath R thigh TDC Hep 3700  -Parsabiv 5mg  qHD -Hectorol2mcg IV qHD  Problem/Plan: 1. MSSABacteremia: Presumed due toL foot osteomyelitis.On Cefazolin per pharm.Per ID needs 6 wks total  Ancef on HD thru 01/22/17. Repeat BC x2  2 wks after completes ancef. TDC does not appear infected.  2. 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 ESRD: HD per MWF schedule. Has had Difficulties with blood flows in TDCwithprior HD,s/p multiple tPA dwell without much improvement.after repeat tPA dwell BUT WITH using 3mg  (as opposed to usual 2mg ) per lumen.BFR  275 bfr tolerated  Last hd ,12/14= K 4.8  (was on 1.0 k bath as op with ho ^ K and wknd kayexalate script"insur not covering po med) Follow up labs   4. Hypotension/volume: BP chronically low, on midodrine for BP support. Follow. 5. Anemiaof CKD: Hgb 9.3>9.1>9.8 , no ESA as outpatient.Small dose(Aranesp 40)given 12/11, continue on HD 12/18 6. Secondary Hyperparathyroidism: CorrCa/Phos ok.Continue VDRA and binders. Unable to get parsabiv because not available in hospital 7. Nutrition -Currently on regular  diet. Follow labs.Renavite. Adding pro-stat. 8. Hx seizures - per primary  9.   Deconditioning = Spina Bifida/ sp Amputation L grt toe = for  Dc to rehab center when available   Ernest Haber, PA-C [facility] [phone] 12/16/2017,10:45 AM  LOS: 9 days   Pt seen, examined and agree w A/P as above.  [name] MD [facility] pager [phone]   12/16/2017, 2:54 PM    Labs: Basic Metabolic Panel: Recent Labs  Lab 12/09/17 1241  12/13/17 0351 12/14/17 0520 12/15/17 0519  NA  --    < > 135 136 135  K  --    < > 4.8 5.0 4.8  CL  --    < > 97* 97* 96*  CO2  --    < > 24 25 25   GLUCOSE  --    < > 87 77 75  BUN  --    < > 46* 62* 43*  CREATININE  --    < > 8.66* 11.43* 9.11*  CALCIUM  --    < > 8.2* 8.3* 8.5*  PHOS 4.2  --   --  5.5* 5.6*   < > = values in this interval not displayed.   Liver Function Tests: Recent Labs  Lab 12/14/17 0520 12/15/17 0519  ALBUMIN 2.4* 2.6*   No results for input(s): LIPASE, AMYLASE in the last 168 hours. No results for input(s): AMMONIA in the last 168 hours. CBC: Recent Labs  Lab 12/10/17 0431 12/11/17 0433 12/12/17 0536 12/13/17 0351 12/14/17 0520 12/15/17 0519  WBC 4.9 4.9 4.6  4.2 4.5  --   HGB 9.7* 9.8* 9.4* 9.3* 9.1* 9.8*  HCT 32.5* 32.6* 31.2* 30.2* 29.9* 32.8*  MCV 95.3 94.8 95.7 94.7 95.2  --   PLT 128* 136* 126* 123* 122*  --    Cardiac Enzymes: No results for input(s): CKTOTAL, CKMB, CKMBINDEX, TROPONINI in the last 168 hours. CBG: No results for input(s): GLUCAP in the last 168 hours.  Studies/Results: No results found. Medications: . sodium chloride 0 mL/hr at 12/08/17 1326  . sodium chloride 10 mL/hr at 12/11/17 0825  . sodium chloride 10 mL/hr at 12/14/17 0705  . [START ON 12/17/2017]  ceFAZolin (ANCEF) IV    . methocarbamol (ROBAXIN) IV     . calcium acetate  2,668 mg Oral TID WC  . Chlorhexidine Gluconate Cloth  6 each Topical Q0600  . clopidogrel  75 mg Oral  Daily  . darbepoetin (ARANESP) injection - DIALYSIS  40 mcg Intravenous Q Wed-HD  . [START ON 12/19/2017] darbepoetin (ARANESP) injection - DIALYSIS  40 mcg Intravenous Q Wed-HD  . docusate sodium  100 mg Oral BID  . doxercalciferol  6 mcg Intravenous Q M,W,F-HD  . famotidine  20 mg Oral QHS  . heparin  5,000 Units Subcutaneous Q8H  .  HYDROmorphone (DILAUDID) injection  1-2 mg Intravenous Once  . levothyroxine  125 mcg Oral QAC breakfast  . midodrine  5 mg Oral TID WC  . oxyCODONE-acetaminophen  2 tablet Oral Q6H  . sodium chloride flush  3 mL Intravenous Q12H  . traZODone  100 mg Oral QHS  . zolpidem  10 mg Oral QHS

## 2017-12-16 NOTE — Progress Notes (Deleted)
12/16/17  CSW spoke with the doctor about patient. Patient will need updated PASSAR.   Domenic Schwab, MSW, [place]

## 2017-12-17 DIAGNOSIS — I058 Other rheumatic mitral valve diseases: Secondary | ICD-10-CM

## 2017-12-17 LAB — CBC
HCT: 32.3 % — ABNORMAL LOW (ref 39.0–52.0)
Hemoglobin: 9.5 g/dL — ABNORMAL LOW (ref 13.0–17.0)
MCH: 28.7 pg (ref 26.0–34.0)
MCHC: 29.4 g/dL — ABNORMAL LOW (ref 30.0–36.0)
MCV: 97.6 fL (ref 80.0–100.0)
Platelets: 172 10*3/uL (ref 150–400)
RBC: 3.31 MIL/uL — ABNORMAL LOW (ref 4.22–5.81)
RDW: 17 % — ABNORMAL HIGH (ref 11.5–15.5)
WBC: 6.2 10*3/uL (ref 4.0–10.5)
nRBC: 0 % (ref 0.0–0.2)

## 2017-12-17 LAB — CULTURE, BLOOD (ROUTINE X 2)
Culture: NO GROWTH
Culture: NO GROWTH

## 2017-12-17 LAB — RENAL FUNCTION PANEL
Albumin: 2.5 g/dL — ABNORMAL LOW (ref 3.5–5.0)
Anion gap: 16 — ABNORMAL HIGH (ref 5–15)
BUN: 77 mg/dL — ABNORMAL HIGH (ref 6–20)
CO2: 21 mmol/L — ABNORMAL LOW (ref 22–32)
Calcium: 8.6 mg/dL — ABNORMAL LOW (ref 8.9–10.3)
Chloride: 96 mmol/L — ABNORMAL LOW (ref 98–111)
Creatinine, Ser: 13.48 mg/dL — ABNORMAL HIGH (ref 0.61–1.24)
GFR calc Af Amer: 4 mL/min — ABNORMAL LOW (ref >60)
GFR calc non Af Amer: 4 mL/min — ABNORMAL LOW (ref >60)
Glucose, Bld: 91 mg/dL (ref 70–99)
Phosphorus: 5.7 mg/dL — ABNORMAL HIGH (ref 2.5–4.6)
Potassium: 5.9 mmol/L — ABNORMAL HIGH (ref 3.5–5.1)
Sodium: 133 mmol/L — ABNORMAL LOW (ref 135–145)

## 2017-12-17 LAB — HEMOGLOBIN AND HEMATOCRIT, BLOOD
HCT: 31 % — ABNORMAL LOW (ref 39.0–52.0)
Hemoglobin: 9.4 g/dL — ABNORMAL LOW (ref 13.0–17.0)

## 2017-12-17 MED ORDER — METHOCARBAMOL 500 MG PO TABS: 500.0000 mg | ORAL_TABLET | Freq: Four times a day (QID) | ORAL | 0 refills | Status: DC | PRN

## 2017-12-17 MED ORDER — PROMETHAZINE HCL 25 MG/ML IJ SOLN
INTRAMUSCULAR | Status: AC
  Filled 2017-12-17: qty 1

## 2017-12-17 MED ORDER — LORAZEPAM 0.5 MG PO TABS: 0.5000 mg | ORAL_TABLET | Freq: Four times a day (QID) | ORAL | 0 refills | Status: DC | PRN

## 2017-12-17 MED ORDER — ALTEPLASE 2 MG IJ SOLR
INTRAMUSCULAR | Status: AC
  Administered 2017-12-17: 3 mg
  Filled 2017-12-17: qty 6

## 2017-12-17 MED ORDER — ALTEPLASE 2 MG IJ SOLR
3.0000 mg | Freq: Once | INTRAMUSCULAR | Status: AC
  Administered 2017-12-17: 3 mg

## 2017-12-17 MED ORDER — POLYETHYLENE GLYCOL 3350 17 G PO PACK: 17.0000 g | PACK | Freq: Every day | ORAL | 0 refills | Status: DC | PRN

## 2017-12-17 MED ORDER — MIDODRINE HCL 5 MG PO TABS
ORAL_TABLET | ORAL | Status: AC
  Filled 2017-12-17: qty 1

## 2017-12-17 MED ORDER — OXYCODONE-ACETAMINOPHEN 10-325 MG PO TABS: 1.0000 | ORAL_TABLET | Freq: Four times a day (QID) | ORAL | 0 refills | Status: DC | PRN

## 2017-12-17 MED ORDER — DOXERCALCIFEROL 4 MCG/2ML IV SOLN
INTRAVENOUS | Status: AC
  Filled 2017-12-17: qty 4

## 2017-12-17 MED ORDER — CEFAZOLIN IV (FOR PTA / DISCHARGE USE ONLY): 2.0000 g | INTRAVENOUS | 0 refills | Status: AC

## 2017-12-17 MED ORDER — HEPARIN SODIUM (PORCINE) 1000 UNIT/ML DIALYSIS
4000.0000 [IU] | INTRAMUSCULAR | Status: DC | PRN
  Administered 2017-12-17: 4000 [IU] via INTRAVENOUS_CENTRAL
  Filled 2017-12-17: qty 4

## 2017-12-17 MED ORDER — HEPARIN SODIUM (PORCINE) 1000 UNIT/ML IJ SOLN
INTRAMUSCULAR | Status: AC
  Administered 2017-12-17: 4000 [IU] via INTRAVENOUS_CENTRAL
  Filled 2017-12-17: qty 5

## 2017-12-17 NOTE — Clinical Social Work Placement (Addendum)
   CLINICAL SOCIAL WORK PLACEMENT  NOTE 12/17/17 - DISCHARGED TO [facility] VIA AMBULANCE  Date:  12/17/2017  Patient Details  Name: [name] MRN: [id number] Date of Birth: [birth year]  Clinical Social Work is seeking post-discharge placement for this patient at the [place] level of care (*CSW will initial, date and re-position this form in  chart as items are completed):      Patient/family provided with [facility]'s list of facilities offering this level of care within the geographic area requested by the patient (or if unable, by the patient's family).      Patient/family informed of their freedom to choose among providers that offer the needed level of care, that participate in Medicare, Medicaid or managed care program needed by the patient, have an available bed and are willing to accept the patient.      Patient/family informed of [facility]'s ownership interest in [facility] and [facility], as well as of the fact that they are under no obligation to receive care at these facilities.  PASRR submitted to EDS on 12/15/17     PASRR number received on 12/15/17     Existing PASRR number confirmed on       FL2 transmitted to all facilities in geographic area requested by pt/family on 12/15/17     FL2 transmitted to all facilities within larger geographic area on       Patient informed that his/her managed care company has contracts with or will negotiate with certain facilities, including the following:        Yes   Patient/family informed of bed offers received.  Patient chooses bed at [facility]     Physician recommends and patient chooses bed at      Patient to be transferred to [facility] on 12/17/17.  Patient to be transferred to facility by Ambulance     Patient family notified on 12/17/17 of transfer.  Name of family member notified:  Sister  Joziyah Roblero by phone     PHYSICIAN      Additional Comment:  12/17/17 - Daughter completing admissions paperwork at [facility] prior to patient's discharge.   _______________________________________________ Sable Feil, LCSW 12/17/2017, 3:10 PM

## 2017-12-17 NOTE — Progress Notes (Signed)
PT Cancellation Note  Patient Details Name: [name] MRN: [id number] DOB: 1965-01-17   Cancelled Treatment:    Reason Eval/Treat Not Completed: Patient declined, no reason specified. Pt reports feeling "terrible" after HD this morning. Also reports L shoulder pain. Superior and posterior shoulder area tender to palpation. Passive ER painful, and shoulder elevation painful. Pt was able to lift LUE up enough to get a pillow underneath it. Ice pack applied and RN notified.    Thelma Comp 12/17/2017, 2:27 PM   Rolinda Roan, PT, DPT Acute Rehabilitation Services Pager: [phone] Office: [phone]

## 2017-12-17 NOTE — Discharge Instructions (Signed)
Bacteremia °Bacteremia is the presence of bacteria in the blood. When bacteria enter the bloodstream, they can cause a life-threatening reaction called sepsis, which is a medical emergency. Bacteremia can spread to other parts of the body, including the heart, joints, and brain. °What are the causes? °This condition is caused by bacteria that get into the blood. Bacteria can enter the blood: °· From a skin infection or a cut on your skin. °· During an episode of pneumonia. °· From an infection in your stomach or intestine (gastrointestinal infection). °· From an infection in your bladder or urinary system (urinary tract infection). °· During a dental or medical procedure. °· After you brush your teeth so hard that your gums bleed. °· When a bacterial infection in another part of the body spreads to the blood. °· Through a dirty needle. ° °What increases the risk? °This condition is more likely to develop in: °· Children. °· Elderly adults. °· People who have a long-lasting (chronic) disease or medical condition. °· People who have an artificial joint or heart valve. °· People who have heart valve disease. °· People who have a tube, such as a catheter or IV tube, that has been inserted for a medical treatment. °· People who have a weak body defense system (immune system). °· People who use IV drugs. ° °What are the signs or symptoms? °Symptoms of this condition include: °· Fever. °· Chills. °· A racing heart. °· Shortness of breath. °· Dizziness. °· Weakness. °· Confusion. °· Nausea or vomiting. °· Diarrhea. ° °In some cases, there are no symptoms. Bacteremia that has spread to the other parts of the body may cause symptoms in those areas. °How is this diagnosed? °This condition may be diagnosed with a physical exam and tests, such as: °· A complete blood count (CBC). This test looks for signs of infection. °· Blood cultures. These look for bacteria in your blood. °· Tests of any tubes that you may have inserted into  your body, such as an IV tube or urinary catheter. These tests look for a source of infection. °· Urine tests including urine cultures. These look for bacteria in the urine that could be a source of infection. °· Imaging tests, such as an X-ray, CT scan, MRI, or heart ultrasound. These look for a source of infection in other parts of the body, such as the lungs, heart valves, or joints. ° °How is this treated? °This condition may be treated with: °· Antibiotic medicines given through an IV infusion. Depending on the source of infection, antibiotics may be needed for several weeks. At first, an antibiotic may be given to kill most types of blood bacteria. If your test results show that a certain kind of bacteria is causing problems, the antibiotic may be changed to kill only the bacteria that are causing problems. °· Antibiotics taken by mouth. °· IV fluids to support the body as you fight the infection. °· Removing any catheter or device that could be a source of infection. °· Blood pressure and breathing support, if you have sepsis. °· Surgery to control the source or spread of infection, such as: °? Removing an infected implanted device. °? Removing infected tissue or an abscess. ° °This condition is usually treated at a hospital. If you are treated at home, you may need to come back for medicines, blood tests, and evaluation. This is important. °Follow these instructions at home: °· Take over-the-counter and prescription medicines only as told by your health care provider. °·   If you were prescribed an antibiotic, take it as told by your health care provider. Do not stop taking the antibiotic even if you start to feel better. °· Rest until your condition is under control. °· Drink enough fluid to keep your urine clear or pale yellow. °· Do not smoke. If you need help quitting, ask your health care provider. °· Keep all follow-up visits as told by your health care provider. This is important. °How is this  prevented? °· Get the vaccinations that your health care provider recommends. °· Clean and cover any scrapes or cuts. °· Take good care of your skin. This includes regular bathing and moisturizing. °· Wash your hands often. °· Practice good oral hygiene. Brush your teeth two times a day and floss regularly. °Get help right away if: °· You have pain. °· You have a fever. °· You have trouble breathing. °· Your skin becomes blotchy, pale, or clammy. °· You develop confusion, dizziness, or weakness. °· You develop diarrhea. °· You develop any new symptoms after treatment. °Summary °· Bacteremia is the presence of bacteria in the blood. When bacteria enter the bloodstream, they can cause a life- threatening reaction called sepsis. °· Children and elderly adults are at increased risk of bacteremia. Other risk factors include having a long-lasting (chronic) disease or a weak immune system, having an artificial joint or heart valve, having heart valve disease, having tubes that were inserted in the body for medical treatment, or using IV drugs. °· Some symptoms of bacteremia include fever, chills, shortness of breath, confusion, nausea or vomiting, and diarrhea. °· Tests may be done to diagnose a source of infection that led to bacteremia. These tests may include blood tests, urine tests, and imaging tests. °· Bacteremia is usually treated with antibiotics, usually in a hospital. °This information is not intended to replace advice given to you by your health care provider. Make sure you discuss any questions you have with your health care provider. °Document Released: 10/02/2005 Document Revised: 11/16/2015 Document Reviewed: 11/16/2015 °Elsevier Interactive Patient Education © 2018 [employer] ° °

## 2017-12-17 NOTE — Progress Notes (Addendum)
Subjective:  Seen in HD unit. Goal 3.5L Pain controlled this am. Sleepy. No new c/os.   Objective Vital signs in last 24 hours: Vitals:   12/16/17 0957 12/16/17 1619 12/16/17 2140 12/17/17 0529  BP: (!) 88/57 (!) 107/55 129/74 (!) 101/52  Pulse: 68 77 72 65  Resp: 18 19 18 18   Temp: 98.1 F (36.7 C) 98.8 F (37.1 C) 98.4 F (36.9 C) 98.4 F (36.9 C)  TempSrc: Oral Oral Oral Oral  SpO2: 100% 99% 99% 98%  Weight:   122.3 kg   Height:       Weight change: -1.3 kg  Physical Exam General:Alert,  NAD Heart:RRR;3/6systolicmurmur (heard best on L side) Lungs:CTABanter. Abdomen:soft, non-tender Extremities:L foot bandaged,traceLE edema Dialysis Access:R fem TDC   Dialysis Orders: MWF -AF 4.5h 450/800 122.5kg 2/2.25 bath R thigh TDC Hep 3700  -Parsabiv 5mg  qHD -Hectorol24mcg IV qHD  Problem/Plan: 1. MSSABacteremia: Presumed due toL foot osteomyelitis.On Cefazolin per pharm.Per ID needs 6 wks total  Ancef on HD thru 01/22/17. Repeat BC x2  2 wks after completes ancef. TDC does not appear infected.  2. 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 ESRD: HD per MWF schedule. Has had difficulties with blood flows in TDCwithprior HD,s/p multiple tPA dwells req using 3mg  (as opposed to usual 2mg ) per lumen. TDC function ok today. BFR 300.   4. Hyperkalemia. Chronic issue. Was on 1.0K bath as outpatient with hx^ K and usually takes  kayexalate on weekends at home. Should correct with HD today. Follow labs   5. Hypotension/volume: BP chronically low, on midodrine for BP support. Follow. 6. Anemiaof CKD: Hgb 9.5. Not on ESA as outpatient.Small dose(Aranesp 40)given 12/11, continue q Wed.  7. Secondary Hyperparathyroidism: CorrCa/Phos ok.Continue VDRA and binders. Unable to get parsabiv because not available in  hospital 8. Nutrition -Currently on regular diet. Follow labs.Renavite. Adding pro-stat. 9. Hx seizures - per primary  9.   Deconditioning - Spina Bifida/ sp Amputation L great toe - awaiting SNF placement   Ogechi Larina Earthly PA-C [facility] Pager [phone] 12/17/2017,8:41 AM  Pt seen, examined and agree w A/P as above.  [name] MD [facility] pager [phone]   12/17/2017, 11:39 AM       Labs: Basic Metabolic Panel: Recent Labs  Lab 12/14/17 0520 12/15/17 0519 12/17/17 0437  NA 136 135 133*  K 5.0 4.8 5.9*  CL 97* 96* 96*  CO2 25 25 21*  GLUCOSE 77 75 91  BUN 62* 43* 77*  CREATININE 11.43* 9.11* 13.48*  CALCIUM 8.3* 8.5* 8.6*  PHOS 5.5* 5.6* 5.7*   Liver Function Tests: Recent Labs  Lab 12/14/17 0520 12/15/17 0519 12/17/17 0437  ALBUMIN 2.4* 2.6* 2.5*   No results for input(s): LIPASE, AMYLASE in the last 168 hours. No results for input(s): AMMONIA in the last 168 hours. CBC: Recent Labs  Lab 12/11/17 0433 12/12/17 0536 12/13/17 0351 12/14/17 0520 12/15/17 0519 12/17/17 0437 12/17/17 0715  WBC 4.9 4.6 4.2 4.5  --   --  6.2  HGB 9.8* 9.4* 9.3* 9.1* 9.8* 9.4* 9.5*  HCT 32.6* 31.2* 30.2* 29.9* 32.8* 31.0* 32.3*  MCV 94.8 95.7 94.7 95.2  --   --  97.6  PLT 136* 126* 123* 122*  --   --  172   Cardiac Enzymes: No results for input(s): CKTOTAL, CKMB, CKMBINDEX, TROPONINI in the last 168  hours. CBG: No results for input(s): GLUCAP in the last 168 hours.  Studies/Results: No results found. Medications: . sodium chloride 0 mL/hr at 12/08/17 1326  . sodium chloride 10 mL/hr at 12/11/17 0825  . sodium chloride 10 mL/hr at 12/14/17 0705  .  ceFAZolin (ANCEF) IV    . methocarbamol (ROBAXIN) IV     . calcium acetate  2,668 mg Oral TID WC  . Chlorhexidine Gluconate Cloth  6 each Topical Q0600  . clopidogrel  75 mg Oral Daily  . darbepoetin (ARANESP) injection - DIALYSIS  40 mcg Intravenous Q Wed-HD  . [START  ON 12/19/2017] darbepoetin (ARANESP) injection - DIALYSIS  40 mcg Intravenous Q Wed-HD  . docusate sodium  100 mg Oral BID  . doxercalciferol  6 mcg Intravenous Q M,W,F-HD  . famotidine  20 mg Oral QHS  . heparin  5,000 Units Subcutaneous Q8H  .  HYDROmorphone (DILAUDID) injection  1-2 mg Intravenous Once  . levothyroxine  125 mcg Oral QAC breakfast  . midodrine  5 mg Oral TID WC  . oxyCODONE-acetaminophen  2 tablet Oral Q6H  . promethazine      . sodium chloride flush  3 mL Intravenous Q12H  . traZODone  100 mg Oral QHS  . zolpidem  10 mg Oral QHS

## 2017-12-17 NOTE — Discharge Summary (Signed)
Physician Discharge Summary  [name] JSH:[id number] DOB: 1965-07-04 DOA: 12/07/2017  PCP: [name], MD  Admit date: 12/07/2017 Discharge date: 12/17/2017  Time spent: 45 minutes  Recommendations for Outpatient Follow-up:  Patient will be discharged to skilled nursing facility. Continue therapy.  Patient will need to follow up with primary care provider within one week of discharge.  Continue hemodialysis as scheduled.  Continue ancef with dialysis through 01/19/2018.  Follow up with Dr. Sharol Given this week. Patient should continue medications as prescribed.  Patient should follow a renal diet.   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 Merton Border MichaelFigueroais a52 y.o.male,with past medical history significant for end-stage renal disease on hemodialysis who presented with 1 week history of not feeling well with the fever of 102. Patient denies any chest pains or shortness of breath. Patient reports a chronic cough. Is complaining of left great toe pain and redness that has been getting worse. He was in dialysis when he was found to be febrile at 101.5 and he was started on vancomycin and Fortaz. 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 In the emergency room patient was noted to be afebrile however his blood pressure was low. His white blood cell count was 8.1 with a chest x-ray that did not show any acute changes and left foot x-ray that did not show any osteomyelitis.  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  Small first IP joint effusion with marrow edema on either side of the joint concerning for septic arthritis and osteomyelitis. -Orthopedic surgery consulted and appreciated, s/p left first great toe amputation. Will follow up in the office in one week with Dr. Sharol Given. Operative dressing is not be changed. -Infectious disease consulted and appreciated-recommended TEE.   -Cardiology consulted for TEE: +MV small mobile vegetation on the annulus -Patient will need 6weeks of IV antibiotics (ANCEF) with HD (nephrology made aware) -Patient was placed on vancomycin and ceftazidime, and has been transitioned to cefazolin -Echocardiogram was unremarkable for vegetation -PT recommended home health with wheelchair and rolling walker -orders placed for equipment and HHPT- however now patient and family wanting SNF -Social work consulted -Continue pain control  Right flank pain tenderness -Status post right nephrostomy tube in the past -CT abdomen pelvis without acute findings -Right flank pain resolved  ESRD on hemodialysis -Nephrology consulted and appreciated -Access issues, patient had difficulties with blood flow -Vascular surgery consulted and appreciated, plan for attempted [facility] exchange on 12/13/2017- HOWEVER it seems that patient's [facility] is operating at this time so [facility] exchange now canceled -s/p HD 12/14/2017- next HD on 12/17/2017  Hyperkalemia -Patient given lokelma on 12/12/2017 -Resolved, continue to monitor BMP  Anemia of chronic disease -Patient will receive Aranesp -Hemoglobin currently 9.8 -Continue to monitor CBC  Hypothyroidism -Continue Synthroid  Chronic  hypotension -  Continue midodrine- has been refusing and states midodrine causes him to have headaches  PAD -Continue Plavix  Anxiety -Continue Ativan  Consultants Orthopedic surgery ID Nephrology Vascular surgery Cardiology  Procedures  Left great toe amputation Echocardiogram TEE  Discharge Exam: Vitals:   12/17/17 1218 12/17/17 1229  BP: (!) 121/107 (!) 100/53  Pulse: 67 67  Resp: 18   Temp: 98.6 F (37 C)   SpO2: 100%      General: Well developed, well nourished, NAD, appears stated age  HEENT: NCAT, mucous membranes moist. Poor dentition  Cardiovascular: S1 S2 auscultated, 3/6 SEM, RRR  Respiratory: Clear to auscultation bilaterally with equal chest rise  Abdomen: Soft, nontender, nondistended, + bowel sounds  Extremities: warm dry without cyanosis clubbing or edema. Right foot dressing in place  Neuro: AAOx3, nonfocal  Psych: Normal affect and demeanor  Discharge Instructions Discharge Instructions    Discharge instructions   Complete by:  As directed    Patient will be discharged to skilled nursing facility. Continue therapy.  Patient will need to follow up with primary care provider within one week of discharge.  Continue hemodialysis as scheduled.  Continue ancef with dialysis through 01/19/2018.  Follow up with Dr. Sharol Given this week. Patient should continue medications as prescribed.  Patient should follow a renal diet.   Home infusion instructions [facility] May follow [place] Dosing Protocol; May administer Cathflo as needed to maintain patency of vascular access device.; Flushing of vascular access device: per [facility] Protocol: 0.9% NaCl pre/post medica...   Complete by:  As directed    Instructions:  May follow [place] Dosing Protocol   Instructions:  May administer Cathflo as needed to maintain patency of vascular access device.   Instructions:  Flushing of vascular access device: per [facility] Protocol: 0.9% NaCl pre/post medication  administration and prn patency; Heparin 100 u/ml, 61m for implanted ports and Heparin 10u/ml, 521mfor all other central venous catheters.   Instructions:  May follow [facility] Anaphylaxis Protocol for First Dose Administration in the home: 0.9% NaCl at 25-50 ml/hr to maintain IV access for protocol meds. Epinephrine 0.3 ml IV/IM PRN and Benadryl 25-50 IV/IM PRN s/s of anaphylaxis.   Instructions:  [facility] Coordinator (RN) to assist per patient IV care needs in the home PRN.   Touch down weight bearing   Complete by:  As directed    Laterality:  left   Extremity:  Lower     Allergies as of 12/17/2017      Reactions   Furadantin [nitrofurantoin] Other (See Comments)   UNSPECIFIED REACTION    Mandelamine [methenamine] Other (See Comments)   UNSPECIFIED REACTION    Noroxin [norfloxacin] Other (See Comments)   UNSPECIFIED REACTION    Carmine Nausea Only   Contrast Media [iodinated Diagnostic Agents] Nausea And Vomiting   Oral dye causes vomiting, IV dye is okay   Hydrocodone Other (See Comments)   LIKELY NOT REACTION TO HYDROCODONE Pt states that after 3 weeks of taking this medication he will began to "twitch"   Metrizamide Nausea And Vomiting   Oral dye causes vomiting, IV dye is okay   Sulfa Antibiotics Cough   Childhood reaction - pt could not confirm that it was a cough   Sulfur Cough   Childhood reaction - pt could not confirm that it was a cough      Medication List    STOP taking these medications   aspirin 325 MG tablet   furosemide 80  MG tablet Commonly known as:  LASIX     TAKE these medications   calcium acetate 667 MG capsule Commonly known as:  PHOSLO Take 2,001-2,708 mg by mouth See admin instructions. Take 2708 mg by mouth 3 times daily with meals, take 2001 mg by mouth with snacks   ceFAZolin  IVPB Commonly known as:  ANCEF Inject 2 g into the vein every Monday, Wednesday, and Friday with hemodialysis. Indication:  MSSA Bacteremia Last Day of  Therapy:  01/19/2018 Labs - Once weekly:  CBC/D and BMP, Labs - Every other week:  ESR and CRP   clopidogrel 75 MG tablet Commonly known as:  PLAVIX Take 1 tablet (75 mg total) by mouth daily.   famotidine 40 MG tablet Commonly known as:  PEPCID Take 40 mg by mouth daily at 2 PM.   fluticasone 50 MCG/ACT nasal spray Commonly known as:  FLONASE Place 2 sprays into both nostrils as needed for allergies or rhinitis (congestion).   levothyroxine 125 MCG tablet Commonly known as:  SYNTHROID, LEVOTHROID Take 125 mcg by mouth daily before breakfast.   LORazepam 0.5 MG tablet Commonly known as:  ATIVAN Take 1 tablet (0.5 mg total) by mouth every 6 (six) hours as needed for anxiety. What changed:  when to take this   methocarbamol 500 MG tablet Commonly known as:  ROBAXIN Take 1 tablet (500 mg total) by mouth every 6 (six) hours as needed for muscle spasms.   midodrine 5 MG tablet Commonly known as:  PROAMATINE Take 1 tablet (5 mg total) by mouth 3 (three) times daily with meals.   oxyCODONE-acetaminophen 10-325 MG tablet Commonly known as:  PERCOCET Take 1 tablet by mouth every 6 (six) hours as needed. What changed:    when to take this  reasons to take this   polyethylene glycol packet Commonly known as:  MIRALAX / GLYCOLAX Take 17 g by mouth daily as needed for mild constipation.   promethazine 25 MG tablet Commonly known as:  PHENERGAN Take 25 mg by mouth 2 (two) times daily.   sodium polystyrene powder Commonly known as:  KAYEXALATE Take 15 g by mouth See admin instructions. Take 15 g (mixed in water) by mouth on Saturdays and Sundays   traZODone 100 MG tablet Commonly known as:  DESYREL Take 100 mg by mouth at bedtime.   zolpidem 10 MG tablet Commonly known as:  AMBIEN Take 10 mg by mouth at bedtime.            Home Infusion Instuctions  (From admission, onward)         Start     Ordered   12/17/17 0000  Home infusion instructions [facility] May follow [place] Dosing Protocol; May administer Cathflo as needed to maintain patency of vascular access device.; Flushing of vascular access device: per [facility] Protocol: 0.9% NaCl pre/post medica...    Question Answer Comment  Instructions May follow [place] Dosing Protocol   Instructions May administer Cathflo as needed to maintain patency of vascular access device.   Instructions Flushing of vascular access device: per [facility] Protocol: 0.9% NaCl pre/post medication administration and prn patency; Heparin 100 u/ml, 66m for implanted ports and Heparin 10u/ml, 550mfor all other central venous catheters.   Instructions May follow [facility] Anaphylaxis Protocol for First Dose Administration in the home: 0.9% NaCl at 25-50 ml/hr to maintain IV access for protocol meds. Epinephrine 0.3 ml IV/IM PRN and Benadryl 25-50 IV/IM PRN s/s of anaphylaxis.  Instructions [facility] Infusion Coordinator (RN) to assist per patient IV care needs in the home PRN.      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  A walker alone will not resolve the issues with performing activities of daily living. A lightweight wheelchair will allow patient to safely perform daily activities.  The patient can self propel in the home or has a caregiver who can provide assistance   12/15/17 1130           Discharge Care Instructions  (From admission, onward)         Start     Ordered   12/11/17 0000  Touch down weight bearing    Question Answer Comment  Laterality left   Extremity Lower      12/11/17 0954         Allergies  Allergen  Reactions  . Furadantin [Nitrofurantoin] Other (See Comments)    UNSPECIFIED REACTION   . Mandelamine [Methenamine] Other (See Comments)    UNSPECIFIED REACTION   . Noroxin [Norfloxacin] Other (See Comments)    UNSPECIFIED REACTION   . Carmine Nausea Only  . Contrast Media [Iodinated Diagnostic Agents] Nausea And Vomiting    Oral dye causes vomiting, IV dye is okay  . Hydrocodone Other (See Comments)    LIKELY NOT REACTION TO HYDROCODONE Pt states that after 3 weeks of taking this medication he will began to "twitch"  . Metrizamide Nausea And Vomiting    Oral dye causes vomiting, IV dye is okay  . Sulfa Antibiotics Cough    Childhood reaction - pt could not confirm that it was a cough  . Sulfur Cough    Childhood reaction - pt could not confirm that it was a cough   Follow-up Information    [name], MD Follow up in 1 day(s).   Specialty:  Orthopedic Surgery Why:  Hospital follow up Contact information: [place] [address] [place]. Call.   Specialty:  Professional Counselor Why:  Walk-in for the first appointment. You can call first to make sure they accept your insurance and to ask about their walk-in hours. Also listed the [facility] location.  Contact information: [facility] [address] [place] information: [address] [place] [address] [phone]        [name], MD. Schedule an appointment as soon as possible for a visit in 1 week(s).   Specialty:  Family Medicine Why:  Hospital follow up Contact information: [address] [place] [address] [phone]        Burnell Blanks, MD .   Specialty:  Cardiology Contact information: [place]. [address] [place] [address] [phone]            The results  of significant  diagnostics from this hospitalization (including imaging, microbiology, ancillary and laboratory) are listed below for reference.    Significant Diagnostic Studies: Ct Abdomen Pelvis Wo Contrast  Result Date: 12/09/2017 CLINICAL DATA:  BacteremiaBacteremia Right flank pain EXAM: CT ABDOMEN AND PELVIS WITHOUT CONTRAST TECHNIQUE: Multidetector CT imaging of the abdomen and pelvis was performed following the standard protocol without IV contrast. COMPARISON:  CT abdomen 5 21 2010 FINDINGS: Lower chest: Lung bases are clear. Hepatobiliary: No focal hepatic lesion on noncontrast exam. Pancreas: Pancreas is normal. No ductal dilatation. No pancreatic inflammation. Spleen: Normal spleen Adrenals/urinary tract: Adrenal glands normal. Kidneys are atrophic. Multiple renal calculi. No hydronephrosis. Bladder is decompressed Stomach/Bowel: Stomach, small-bowel cecum normal. There is a ileostomy in the RIGHT lower quadrant. Small parastomal hernia. No bowel obstruction. Colon rectosigmoid colon normal. Vascular/Lymphatic: Abdominal aorta is normal caliber. Grafts within the LEFT common iliac vein. There is central venous line in the RIGHT common iliac vein extending the IVC. Venous collaterals along the abdominal wall. Reproductive: Prostate normal Other: No free fluid or free air Musculoskeletal: Diffuse increase in bone sclerosis suggesting renal osteodystrophy IMPRESSION: 1. No clear acute findings in the abdomen pelvis. 2. Atrophy kidneys. 3. RIGHT lower quadrant ileostomy without bowel obstruction. 4. 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 No evidence of acute infection or inflammation. 6. Large-bore central venous line from a RIGHT groin approach Electronically Signed   By: Suzy Bouchard M.D.   On: 12/09/2017 00:46   Dg Chest 2 View  Result Date: 12/07/2017 CLINICAL DATA:  52 year old male with a history of fever EXAM: CHEST - 2 VIEW COMPARISON:  08/07/2017 FINDINGS:  Cardiomediastinal silhouette unchanged in size and contour. Low lung volumes with coarsened interstitial markings. No pleural effusion. No confluent airspace disease. No pneumothorax. IMPRESSION: Low lung volumes, with either chronic changes or early edema. Electronically Signed   By: Corrie Mckusick D.O.   On: 12/07/2017 11:44   Mr Foot Left Wo Contrast  Result Date: 12/07/2017 CLINICAL DATA:  Soft tissue wound of the great toe. Evaluate for osteomyelitis. EXAM: MRI OF THE LEFT FOOT WITHOUT CONTRAST TECHNIQUE: Multiplanar, multisequence MR imaging of the left forefoot was performed. No intravenous contrast was administered. COMPARISON:  None. FINDINGS: Bones/Joint/Cartilage Soft tissue wound involving the plantar aspect of the great toe extending to the IP joint. Small first IP joint joint effusion with marrow edema on either side of the joint concerning for septic arthritis. No fracture or dislocation. Normal alignment. No other joint effusion. Advanced degenerative changes of midfoot which are partially visualized. Ligaments Collateral ligaments are intact.  Lisfranc ligament is intact. Muscles and Tendons Flexor, peroneal and extensor compartment tendons are intact. Diffuse muscle atrophy throughout the left foot. Soft tissue No fluid collection or hematoma.  No soft tissue mass. IMPRESSION: 1. Soft tissue wound involving the plantar aspect of the great toe extending to the IP joint. Small first IP joint joint effusion with marrow edema on either side of the joint concerning for septic arthritis and osteomyelitis. Electronically Signed   By: Kathreen Devoid   On: 12/07/2017 19:02   Dg Foot Complete Left  Result Date: 12/07/2017 CLINICAL DATA:  Toe infection. EXAM: LEFT FOOT - COMPLETE 3+ VIEW COMPARISON:  None. FINDINGS: Soft tissue deformity noted of the distal great toe. I do not see plain radiographic evidence of osteomyelitis. There is some chronic post traumatic deformity of the inter phalangeal joint of  the great toe. Degenerative changes are noted in the midfoot, possibly neuropathic or post traumatic.  IMPRESSION: Soft tissue abnormality of the great toe without plain radiographic evidence of underlying osteomyelitis. Extensive degenerative changes of the midfoot, possibly neuropathic or post traumatic. Electronically Signed   By: Nelson Chimes M.D.   On: 12/07/2017 12:50    Microbiology: Recent Results (from the past 240 hour(s))  MRSA PCR Screening     Status: None   Collection Time: 12/08/17 12:05 AM  Result Value Ref Range Status   MRSA by PCR NEGATIVE NEGATIVE Final    Comment:        The GeneXpert MRSA Assay (FDA approved for NASAL specimens only), is one component of a comprehensive MRSA colonization surveillance program. It is not intended to diagnose MRSA infection nor to guide or monitor treatment for MRSA infections. Performed at [facility], [place] [address] [place], [place] [address]   Culture, blood (Routine X 2) w Reflex to ID Panel     Status: None   Collection Time: 12/12/17  9:00 AM  Result Value Ref Range Status   Specimen Description BLOOD RIGHT HAND  Final   Special Requests   Final    BOTTLES DRAWN AEROBIC AND ANAEROBIC Blood Culture results may not be optimal due to an inadequate volume of blood received in culture bottles   Culture   Final    NO GROWTH 5 DAYS Performed at [facility], [place] [address] [place], [place] [address]    Report Status 12/17/2017 FINAL  Final  Culture, blood (Routine X 2) w Reflex to ID Panel     Status: None   Collection Time: 12/12/17  9:15 AM  Result Value Ref Range Status   Specimen Description BLOOD RIGHT FOREARM  Final   Special Requests   Final    BOTTLES DRAWN AEROBIC AND ANAEROBIC Blood Culture results may not be optimal due to an inadequate volume of blood received in culture bottles   Culture   Final    NO GROWTH 5 DAYS Performed at [facility], [place] [address] [place], [place] [address]     Report Status 12/17/2017 FINAL  Final     Labs: Basic Metabolic Panel: Recent Labs  Lab 12/12/17 0536 12/13/17 0351 12/14/17 0520 12/15/17 0519 12/17/17 0437  NA 134* 135 136 135 133*  K 6.5* 4.8 5.0 4.8 5.9*  CL 96* 97* 97* 96* 96*  CO2 21* _0 21*  GLUCOSE 74 87 77 75 91  BUN 92* 46* 62* 43* 77*  CREATININE 13.41* 8.66* 11.43* 9.11* 13.48*  CALCIUM 8.4* 8.2* 8.3* 8.5* 8.6*  PHOS  --   --  5.5* 5.6* 5.7*   Liver Function Tests: Recent Labs  Lab 12/14/17 0520 12/15/17 0519 12/17/17 0437  ALBUMIN 2.4* 2.6* 2.5*   No results for input(s): LIPASE, AMYLASE in the last 168 hours. No results for input(s): AMMONIA in the last 168 hours. CBC: Recent Labs  Lab 12/11/17 0433 12/12/17 0536 12/13/17 0351 12/14/17 0520 12/15/17 0519 12/17/17 0437 12/17/17 0715  WBC 4.9 4.6 4.2 4.5  --   --  6.2  HGB 9.8* 9.4* 9.3* 9.1* 9.8* 9.4* 9.5*  HCT 32.6* 31.2* 30.2* 29.9* 32.8* 31.0* 32.3*  MCV 94.8 95.7 94.7 95.2  --   --  97.6  PLT 136* 126* 123* 122*  --   --  172   Cardiac Enzymes: No results for input(s): CKTOTAL, CKMB, CKMBINDEX, TROPONINI in the last 168 hours. BNP: BNP (last 3 results) No results for input(s): BNP in the last 8760  hours.  ProBNP (last 3 results) No results for input(s): PROBNP in the last 8760 hours.  CBG: No results for input(s): GLUCAP in the last 168 hours.     Signed:  Cristal Ford  [facility] 12/17/2017, 1:02 PM

## 2017-12-17 NOTE — Progress Notes (Signed)
Treatment ended after 3 hours because the patient's blood lines clotted twice and the Arterial pressures kept dropping even though the lines were reversed, [name] was informed and cath-flow was ordered, Dr Jonnie Finner was informed as well

## 2017-12-17 NOTE — Progress Notes (Signed)
[name] to be D/C'd Skilled nursing facility per MD order.  Discussed prescriptions and follow up appointments with the patient. Prescriptions given to patient, medication list explained in detail. Pt verbalized understanding.  Allergies as of 12/17/2017      Reactions   Furadantin [nitrofurantoin] Other (See Comments)   UNSPECIFIED REACTION    Mandelamine [methenamine] Other (See Comments)   UNSPECIFIED REACTION    Noroxin [norfloxacin] Other (See Comments)   UNSPECIFIED REACTION    Carmine Nausea Only   Contrast Media [iodinated Diagnostic Agents] Nausea And Vomiting   Oral dye causes vomiting, IV dye is okay   Hydrocodone Other (See Comments)   LIKELY NOT REACTION TO HYDROCODONE Pt states that after 3 weeks of taking this medication he will began to "twitch"   Metrizamide Nausea And Vomiting   Oral dye causes vomiting, IV dye is okay   Sulfa Antibiotics Cough   Childhood reaction - pt could not confirm that it was a cough   Sulfur Cough   Childhood reaction - pt could not confirm that it was a cough      Medication List    STOP taking these medications   aspirin 325 MG tablet   furosemide 80 MG tablet Commonly known as:  LASIX     TAKE these medications   calcium acetate 667 MG capsule Commonly known as:  PHOSLO Take 2,001-2,708 mg by mouth See admin instructions. Take 2708 mg by mouth 3 times daily with meals, take 2001 mg by mouth with snacks   ceFAZolin  IVPB Commonly known as:  ANCEF Inject 2 g into the vein every Monday, Wednesday, and Friday with hemodialysis. Indication:  MSSA Bacteremia Last Day of Therapy:  01/19/2018 Labs - Once weekly:  CBC/D and BMP, Labs - Every other week:  ESR and CRP   clopidogrel 75 MG tablet Commonly known as:  PLAVIX Take 1 tablet (75 mg total) by mouth daily.   famotidine 40 MG tablet Commonly known as:  PEPCID Take 40 mg by mouth daily at 2 PM.   fluticasone 50 MCG/ACT nasal spray Commonly known as:  FLONASE Place  2 sprays into both nostrils as needed for allergies or rhinitis (congestion).   levothyroxine 125 MCG tablet Commonly known as:  SYNTHROID, LEVOTHROID Take 125 mcg by mouth daily before breakfast.   LORazepam 0.5 MG tablet Commonly known as:  ATIVAN Take 1 tablet (0.5 mg total) by mouth every 6 (six) hours as needed for anxiety. What changed:  when to take this   methocarbamol 500 MG tablet Commonly known as:  ROBAXIN Take 1 tablet (500 mg total) by mouth every 6 (six) hours as needed for muscle spasms.   midodrine 5 MG tablet Commonly known as:  PROAMATINE Take 1 tablet (5 mg total) by mouth 3 (three) times daily with meals.   oxyCODONE-acetaminophen 10-325 MG tablet Commonly known as:  PERCOCET Take 1 tablet by mouth every 6 (six) hours as needed. What changed:    when to take this  reasons to take this   polyethylene glycol packet Commonly known as:  MIRALAX / GLYCOLAX Take 17 g by mouth daily as needed for mild constipation.   promethazine 25 MG tablet Commonly known as:  PHENERGAN Take 25 mg by mouth 2 (two) times daily.   sodium polystyrene powder Commonly known as:  KAYEXALATE Take 15 g by mouth See admin instructions. Take 15 g (mixed in water) by mouth on Saturdays and Sundays   traZODone 100 MG tablet Commonly  known as:  DESYREL Take 100 mg by mouth at bedtime.   zolpidem 10 MG tablet Commonly known as:  AMBIEN Take 10 mg by mouth at bedtime.            Home Infusion Instuctions  (From admission, onward)         Start     Ordered   12/17/17 0000  Home infusion instructions [facility] May follow [place] Dosing Protocol; May administer Cathflo as needed to maintain patency of vascular access device.; Flushing of vascular access device: per [facility] Protocol: 0.9% NaCl pre/post medica...    Question Answer Comment  Instructions May follow [place] Dosing Protocol   Instructions May administer Cathflo as needed to maintain patency of  vascular access device.   Instructions Flushing of vascular access device: per [facility] Protocol: 0.9% NaCl pre/post medication administration and prn patency; Heparin 100 u/ml, 20m for implanted ports and Heparin 10u/ml, 574mfor all other central venous catheters.   Instructions May follow [facility] Anaphylaxis Protocol for First Dose Administration in the home: 0.9% NaCl at 25-50 ml/hr to maintain IV access for protocol meds. Epinephrine 0.3 ml IV/IM PRN and Benadryl 25-50 IV/IM PRN s/s of anaphylaxis.   Instructions [facility] Infusion Coordinator (RN) to assist per patient IV care needs in the home PRN.      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  A walker alone will not resolve the issues with performing activities of daily living. A lightweight wheelchair will allow patient to safely perform daily activities.  The patient can self propel in the home or has a caregiver who can provide assistance   12/15/17 1130           Discharge Care Instructions  (From admission, onward)         Start     Ordered   12/11/17 0000  Touch down weight bearing    Question Answer Comment  Laterality left   Extremity Lower      12/11/17 0954          Vitals:   12/17/17 1218 12/17/17 1229  BP: (!) 121/107 (!) 100/53  Pulse: 67 67  Resp: 18   Temp: 98.6 F (37 C)   SpO2: 100%     Skin clean, dry and intact without evidence of skin break down, no evidence of skin tears noted. IV catheter discontinued intact. Site without signs and symptoms of complications. Dressing and pressure applied. Pt denies pain at this time. No  complaints noted.  An After Visit Summary was printed and given to the patient. [place]. [name] be D/C'd Skilled nursing facility per MD order.  Discussed prescriptions and follow up appointments with the patient. Prescriptions given to patient, medication list explained in detail. Pt verbalized understanding.  Allergies as of 12/17/2017      Reactions   Furadantin [nitrofurantoin] Other (See Comments)   UNSPECIFIED REACTION    Mandelamine [methenamine] Other (See Comments)   UNSPECIFIED REACTION  Noroxin [norfloxacin] Other (See Comments)   UNSPECIFIED REACTION    Carmine Nausea Only   Contrast Media [iodinated Diagnostic Agents] Nausea And Vomiting   Oral dye causes vomiting, IV dye is okay   Hydrocodone Other (See Comments)   LIKELY NOT REACTION TO HYDROCODONE Pt states that after 3 weeks of taking this medication he will began to "twitch"   Metrizamide Nausea And Vomiting   Oral dye causes vomiting, IV dye is okay   Sulfa Antibiotics Cough   Childhood reaction - pt could not confirm that it was a cough   Sulfur Cough   Childhood reaction - pt could not confirm that it was a cough      Medication List    STOP taking these medications   aspirin 325 MG tablet   furosemide 80 MG tablet Commonly known as:  LASIX     TAKE these medications   calcium acetate 667 MG capsule Commonly known as:  PHOSLO Take 2,001-2,708 mg by mouth See admin instructions. Take 2708 mg by mouth 3 times daily with meals, take 2001 mg by mouth with snacks   ceFAZolin  IVPB Commonly known as:  ANCEF Inject 2 g into the vein every Monday, Wednesday, and Friday with hemodialysis. Indication:  MSSA Bacteremia Last Day of Therapy:  01/19/2018 Labs - Once weekly:  CBC/D and BMP, Labs - Every other week:  ESR and CRP   clopidogrel 75 MG tablet Commonly known as:  PLAVIX Take 1 tablet (75 mg total) by mouth daily.   famotidine 40 MG  tablet Commonly known as:  PEPCID Take 40 mg by mouth daily at 2 PM.   fluticasone 50 MCG/ACT nasal spray Commonly known as:  FLONASE Place 2 sprays into both nostrils as needed for allergies or rhinitis (congestion).   levothyroxine 125 MCG tablet Commonly known as:  SYNTHROID, LEVOTHROID Take 125 mcg by mouth daily before breakfast.   LORazepam 0.5 MG tablet Commonly known as:  ATIVAN Take 1 tablet (0.5 mg total) by mouth every 6 (six) hours as needed for anxiety. What changed:  when to take this   methocarbamol 500 MG tablet Commonly known as:  ROBAXIN Take 1 tablet (500 mg total) by mouth every 6 (six) hours as needed for muscle spasms.   midodrine 5 MG tablet Commonly known as:  PROAMATINE Take 1 tablet (5 mg total) by mouth 3 (three) times daily with meals.   oxyCODONE-acetaminophen 10-325 MG tablet Commonly known as:  PERCOCET Take 1 tablet by mouth every 6 (six) hours as needed. What changed:    when to take this  reasons to take this   polyethylene glycol packet Commonly known as:  MIRALAX / GLYCOLAX Take 17 g by mouth daily as needed for mild constipation.   promethazine 25 MG tablet Commonly known as:  PHENERGAN Take 25 mg by mouth 2 (two) times daily.   sodium polystyrene powder Commonly known as:  KAYEXALATE Take 15 g by mouth See admin instructions. Take 15 g (mixed in water) by mouth on Saturdays and Sundays   traZODone 100 MG tablet Commonly known as:  DESYREL Take 100 mg by mouth at bedtime.   zolpidem 10 MG tablet Commonly known as:  AMBIEN Take 10 mg by mouth at bedtime.            Home Infusion Instuctions  (From admission, onward)         Start     Ordered   12/17/17 0000  Home infusion instructions [facility] May follow [place] Dosing Protocol; May administer Cathflo as needed to maintain patency of vascular access device.; Flushing of vascular access device: per [facility] Protocol: 0.9% NaCl pre/post medica...    Question  Answer Comment  Instructions May follow [place] Dosing Protocol   Instructions May administer Cathflo as needed to maintain patency of vascular access device.   Instructions Flushing of vascular access device: per [facility] Protocol: 0.9% NaCl pre/post medication administration and prn patency; Heparin 100 u/ml, 80m for implanted ports and Heparin 10u/ml, 560mfor all other central venous catheters.   Instructions May follow [facility] Anaphylaxis Protocol for First Dose Administration in the home: 0.9% NaCl at 25-50 ml/hr to maintain IV access for protocol meds. Epinephrine 0.3 ml IV/IM PRN and Benadryl 25-50 IV/IM PRN s/s of anaphylaxis.   Instructions [facility] Infusion Coordinator (RN) to assist per patient IV care needs in the home PRN.      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  A walker alone will not resolve the issues with performing activities of daily living. A lightweight wheelchair will allow patient to safely perform daily activities.  The patient can self propel in the home or has a caregiver who can provide assistance   12/15/17 1130           Discharge Care Instructions  (From admission, onward)         Start     Ordered   12/11/17 0000  Touch down weight bearing    Question Answer Comment  Laterality left   Extremity Lower      12/11/17 0954          Vitals:   12/17/17 1218 12/17/17 1229  BP: (!) 121/107 (!) 100/53  Pulse: 67 67  Resp: 18   Temp: 98.6 F (37 C)   SpO2: 100%     Skin clean, dry and intact without evidence of skin break down, no evidence of skin tears noted. IV  catheter discontinued intact. Site without signs and symptoms of complications. Dressing and pressure applied. Pt denies pain at this time. No complaints noted.  An After Visit Summary was printed and given to the patient. [place] [facility] [place].

## 2017-12-18 ENCOUNTER — Encounter: Payer: Self-pay | Admitting: Internal Medicine

## 2017-12-18 ENCOUNTER — Non-Acute Institutional Stay (SKILLED_NURSING_FACILITY): Payer: Medicare Other | Admitting: Internal Medicine

## 2017-12-18 DIAGNOSIS — B9561 Methicillin susceptible Staphylococcus aureus infection as the cause of diseases classified elsewhere: Secondary | ICD-10-CM

## 2017-12-18 DIAGNOSIS — D631 Anemia in chronic kidney disease: Secondary | ICD-10-CM

## 2017-12-18 DIAGNOSIS — L089 Local infection of the skin and subcutaneous tissue, unspecified: Secondary | ICD-10-CM | POA: Diagnosis not present

## 2017-12-18 DIAGNOSIS — I058 Other rheumatic mitral valve diseases: Secondary | ICD-10-CM

## 2017-12-18 DIAGNOSIS — F419 Anxiety disorder, unspecified: Secondary | ICD-10-CM

## 2017-12-18 DIAGNOSIS — E034 Atrophy of thyroid (acquired): Secondary | ICD-10-CM

## 2017-12-18 DIAGNOSIS — I739 Peripheral vascular disease, unspecified: Secondary | ICD-10-CM

## 2017-12-18 DIAGNOSIS — N186 End stage renal disease: Secondary | ICD-10-CM

## 2017-12-18 DIAGNOSIS — Z89412 Acquired absence of left great toe: Secondary | ICD-10-CM | POA: Diagnosis not present

## 2017-12-18 DIAGNOSIS — Z992 Dependence on renal dialysis: Secondary | ICD-10-CM

## 2017-12-18 DIAGNOSIS — I059 Rheumatic mitral valve disease, unspecified: Secondary | ICD-10-CM

## 2017-12-18 DIAGNOSIS — R7881 Bacteremia: Secondary | ICD-10-CM | POA: Diagnosis not present

## 2017-12-18 NOTE — Progress Notes (Signed)
Location:  [place] Room Number: 629U Place of Service:  SNF (31)  Noah Delaine. Sheppard Coil, MD  Patient Care Team: [name], MD as PCP - General (Family Medicine) Burnell Blanks, MD as PCP - Cardiology (Cardiology) Fleet Contras, MD as Consulting Physician (Nephrology)  Extended Emergency Contact Information Primary Emergency Contact: Lula Olszewski [place] Phone: [phone] Relation: Sister Secondary Emergency Contact: Queen [place] Phone: [phone] Relation: Niece    Allergies: Furadantin [nitrofurantoin]; Mandelamine [methenamine]; Noroxin [norfloxacin]; Carmine; Contrast media [iodinated diagnostic agents]; Hydrocodone; Metrizamide; Sulfa antibiotics; and Sulfur  Chief Complaint  Patient presents with  . New Admit To SNF    Admit to [facility]    HPI: Patient is 52 y.o. male with anemia, anxiety, constipation, end-stage renal disease on dialysis Monday Wednesday Friday, GERD, history of grand mal seizure, urolithiasis, hypertension, hypothyroidism, insomnia, history of remote migraine headaches, history of pneumonia x2, history of spina bifida who who presented to [facility] with 1 week history of not feeling well and a fever of 102.  Patient denied any chest pain or shortness of breath.  Patient reported a chronic cough patient was complaining of left great toe pain and redness that have been getting worse.  He was in dialysis when he was found to be febrile at 101.5 and he was started on vancomycin and Fortaz.  He also complained of chronic right back wound from prior nephrostomy site and reports history of bleeding from the site.  In the ED patient was noted to be febrile but his blood pressure was low his white count was 8.1 with a chest x-ray did not show any acute changes and a left foot x-ray did not show any osteomyelitis.  Patient was admitted to [facility] from 12/6-16 where he was ultimately treated for sepsis secondary to left toe osteomyelitis and MSSA bacteremia as well as endocarditis.  Orthopedic surgery was consulted and patient is now status post left first toe amputation.  Patient will need 6 weeks of IV Ancef with hemodialysis for endocarditis and osteomyelitis per ID.  Course was complicated by vascular access which is resolved, hyperkalemia which was treated and chronic hypertension treated with Middaugh drain which patient refuses.  Patient is admitted to skilled nursing facility for OT/PT and for 6 weeks of IV antibiotics.  While at skilled nursing facility patient will be followed for hypothyroidism treated with Synthroid, peripheral arterial disease treated with Plavix and anxiety treated with Ativan.  Past Medical History:  Diagnosis Date  . Anemia   . Anxiety   . Constipation   . End stage renal failure on dialysis [facility])    "[place]; MWF" (02/29/2016)  . GERD (gastroesophageal reflux disease)   . Grand mal seizure ([place]) X 4   last 1998 (02/29/2016)  . Heart murmur    no problems with per pt. - nephrologist and Dr. Coletta Memos  . History of blood transfusion 2000s   "I was having blood loss; never found out from where"  . History of kidney stones   . Hypertension    hx of - has not taken bp meds in over 2 years  . Hypothyroidism   . Insomnia   . Migraine    "due to my BP in my late 1990s; none since" (02/29/2016)  . Nonhealing surgical wound    of the left arteriovenous graft  . Pneumonia    x 2  .  Shortness of breath    rarely  . Spina bifida [facility])     Past Surgical History:  Procedure Laterality Date  . AMPUTATION Left 12/11/2017   Procedure: Left Great Toe Amputation;  Surgeon: [name], MD;  Location: [place];  Service: Orthopedics;  Laterality: Left;  . APPENDECTOMY    . ARTERIOVENOUS GRAFT PLACEMENT Left 10/16/2012   left femoral goretex graft         Dr Donnetta Hutching  . AV FISTULA PLACEMENT Left  06/27/2012   Procedure: EXPLORATORY LEFT THY-GRAFT PSEUDO-ANEURYSM;  Surgeon: Conrad Wooster, MD;  Location: [place];  Service: Vascular;  Laterality: Left;  Revision of left Arteriovenus gortex graft in thigh.  . COLONOSCOPY    . I&D EXTREMITY Left 02/29/2016   Procedure: DEBRIDEMENT LEFT THIGH WOUND;  Surgeon: Waynetta Sandy, MD;  Location: [place];  Service: Vascular;  Laterality: Left;  . ILEOSTOMY  1970s  . INCISION AND DRAINAGE Left 10/16/2012   Procedure: INCISION AND Debridement left thigh graft;  Surgeon: Rosetta Posner, MD;  Location: [place];  Service: Vascular;  Laterality: Left;  . INSERTION OF DIALYSIS CATHETER    . INSERTION OF DIALYSIS CATHETER  01/14/2016   Procedure: INSERTION OF DIALYSIS CATHETER;  Surgeon: Waynetta Sandy, MD;  Location: [place];  Service: Vascular;;  . INSERTION OF DIALYSIS CATHETER Right 08/07/2017   Procedure: INSERTION OF DIALYSIS CATHETER;  Surgeon: Waynetta Sandy, MD;  Location: [place];  Service: Vascular;  Laterality: Right;  . INSERTION OF ILIAC STENT Left 01/14/2016   Procedure: INSERTION OF ILIAC STENT;  Surgeon: Waynetta Sandy, MD;  Location: [place];  Service: Vascular;  Laterality: Left;  . INTRAOPERATIVE ARTERIOGRAM Left 01/14/2016   Procedure: INTRA OPERATIVE ARTERIOGRAM;  Surgeon: Waynetta Sandy, MD;  Location: [place];  Service: Vascular;  Laterality: Left;  . LITHOTRIPSY  x 3   "& a laser treatment" (02/29/2016)  . NEPHROSTOMY Bilateral 1968  . PATELLAR TENDON REPAIR Right 1990s   "big incision"  . PERIPHERAL VASCULAR CATHETERIZATION  01/14/2016   Procedure: A/V SHUNTOGRAM;  Surgeon: Waynetta Sandy, MD;  Location: [place];  Service: Vascular;;  . REVISION OF ARTERIOVENOUS GORETEX GRAFT Left 10/16/2012   Procedure: REVISION OF LEFT FEMORAL LOOP ARTERIOVENOUS GORETEX GRAFT;  Surgeon: Rosetta Posner, MD;  Location: [place];  Service: Vascular;  Laterality: Left;  . REVISION OF ARTERIOVENOUS GORETEX GRAFT Left  02/11/2015   Procedure: EXCISION OF SMALL SEGMENT OF EXPOSED LEFT THIGH NON FUNCTIONING  ARTERIOVENOUS GORETEX GRAFT;  Surgeon: Mal Misty, MD;  Location: [place];  Service: Vascular;  Laterality: Left;  . REVISION OF ARTERIOVENOUS GORETEX GRAFT Left 01/14/2016   Procedure: REVISION OF ARTERIOVENOUS GORETEX GRAFT;  Surgeon: Waynetta Sandy, MD;  Location: [place];  Service: Vascular;  Laterality: Left;  . REVISION OF ARTERIOVENOUS GORETEX GRAFT Left 02/29/2016   thigh/pt report  . REVISION OF ARTERIOVENOUS GORETEX GRAFT Left 02/29/2016   Procedure: POSSIBLE REVISION OF LEFT THIGH ARTERIOVENOUS GORETEX GRAFT;  Surgeon: Waynetta Sandy, MD;  Location: [place];  Service: Vascular;  Laterality: Left;  . TEE WITHOUT CARDIOVERSION N/A 12/14/2017   Procedure: TRANSESOPHAGEAL ECHOCARDIOGRAM (TEE);  Surgeon: Acie Fredrickson Wonda Cheng, MD;  Location: [facility] ENDOSCOPY;  Service: Cardiovascular;  Laterality: N/A;  . THROMBECTOMY W/ EMBOLECTOMY Left 01/14/2016   Procedure: THROMBECTOMY ARTERIOVENOUS GORE-TEX Left thigh GRAFT;  Surgeon: Waynetta Sandy, MD;  Location: [place];  Service: Vascular;  Laterality: Left;  . TONGUE SURGERY  ~ 1990  tongue-tie release   . ULTRASOUND GUIDANCE FOR VASCULAR ACCESS Right 08/07/2017   Procedure: ULTRASOUND GUIDANCE FOR VASCULAR CANNULATION RIGHT FEMORAL VEIN AND LEFT AV FEMORAL GRAFT;  Surgeon: Waynetta Sandy, MD;  Location: [place];  Service: Vascular;  Laterality: Right;  . UPPER EXTREMITY VENOGRAPHY Bilateral 08/09/2017   Procedure: UPPER EXTREMITY VENOGRAPHY;  Surgeon: Serafina Mitchell, MD;  Location: [place] CV LAB;  Service: Cardiovascular;  Laterality: Bilateral;  . VENOGRAM N/A 09/20/2017   Procedure: RIGHT COMMON FEMORAL ARTERY EXPLORATION.  CANNULATION RIGHT COMMON FEMORAL VEIN. VENOGRAM CENTRAL ULTRA SOUND GUIDED RIGHT FEMORAL VEIN TIMES TWO.;  Surgeon: Waynetta Sandy, MD;  Location: [place];  Service: Vascular;  Laterality: N/A;  . WOUND  DEBRIDEMENT Left 02/29/2016   thigh    Allergies as of 12/18/2017      Reactions   Furadantin [nitrofurantoin] Other (See Comments)   UNSPECIFIED REACTION    Mandelamine [methenamine] Other (See Comments)   UNSPECIFIED REACTION    Noroxin [norfloxacin] Other (See Comments)   UNSPECIFIED REACTION    Carmine Nausea Only   Contrast Media [iodinated Diagnostic Agents] Nausea And Vomiting   Oral dye causes vomiting, IV dye is okay   Hydrocodone Other (See Comments)   LIKELY NOT REACTION TO HYDROCODONE Pt states that after 3 weeks of taking this medication he will began to "twitch"   Metrizamide Nausea And Vomiting   Oral dye causes vomiting, IV dye is okay   Sulfa Antibiotics Cough   Childhood reaction - pt could not confirm that it was a cough   Sulfur Cough   Childhood reaction - pt could not confirm that it was a cough      Medication List       Accurate as of December 18, 2017 11:27 AM. Always use your most recent med list.        calcium acetate 667 MG capsule Commonly known as:  PHOSLO Take 2,001-2,708 mg by mouth See admin instructions. Take 2708 mg by mouth 3 times daily with meals, take 2001 mg by mouth with snacks   ceFAZolin  IVPB Commonly known as:  ANCEF Inject 2 g into the vein every Monday, Wednesday, and Friday with hemodialysis. Indication:  MSSA Bacteremia Last Day of Therapy:  01/19/2018 Labs - Once weekly:  CBC/D and BMP, Labs - Every other week:  ESR and CRP   clopidogrel 75 MG tablet Commonly known as:  PLAVIX Take 1 tablet (75 mg total) by mouth daily.   famotidine 40 MG tablet Commonly known as:  PEPCID Take 40 mg by mouth daily at 2 PM.   fluticasone 50 MCG/ACT nasal spray Commonly known as:  FLONASE Place 2 sprays into both nostrils as needed for allergies or rhinitis (congestion).   levothyroxine 125 MCG tablet Commonly known as:  SYNTHROID, LEVOTHROID Take 125 mcg by mouth daily before breakfast.   LORazepam 0.5 MG tablet Commonly  known as:  ATIVAN Take 1 tablet (0.5 mg total) by mouth every 6 (six) hours as needed for anxiety.   methocarbamol 500 MG tablet Commonly known as:  ROBAXIN Take 1 tablet (500 mg total) by mouth every 6 (six) hours as needed for muscle spasms.   midodrine 5 MG tablet Commonly known as:  PROAMATINE Take 1 tablet (5 mg total) by mouth 3 (three) times daily with meals.   oxyCODONE-acetaminophen 10-325 MG tablet Commonly known as:  PERCOCET Take 1 tablet by mouth every 6 (six) hours as needed.   polyethylene glycol packet Commonly known  as:  MIRALAX / GLYCOLAX Take 17 g by mouth daily as needed for mild constipation.   promethazine 25 MG tablet Commonly known as:  PHENERGAN Take 25 mg by mouth 2 (two) times daily.   sodium polystyrene powder Commonly known as:  KAYEXALATE Take 15 g by mouth See admin instructions. Take 15 g (mixed in water) by mouth on Saturdays and Sundays   traZODone 100 MG tablet Commonly known as:  DESYREL Take 100 mg by mouth at bedtime.   zolpidem 10 MG tablet Commonly known as:  AMBIEN Take 10 mg by mouth at bedtime.       No orders of the defined types were placed in this encounter.   Immunization History  Administered Date(s) Administered  . Pneumococcal Polysaccharide-23 10/17/2012    Social History   Tobacco Use  . Smoking status: Never Smoker  . Smokeless tobacco: Never Used  Substance Use Topics  . Alcohol use: Not Currently    Alcohol/week: 0.0 standard drinks    Comment: 02/29/2016 "nothing since  the mid 1990s    Review of Systems  DATA OBTAINED: from patient, nurse GENERAL:  no fevers, fatigue, appetite changes SKIN: No itching, rash HEENT: No complaint RESPIRATORY: No cough, wheezing, SOB CARDIAC: No chest pain, palpitations, lower extremity edema  GI: No abdominal pain, No N/V/D or constipation, No heartburn or reflux  GU: No dysuria, frequency or urgency, or incontinence  MUSCULOSKELETAL: No unrelieved bone/joint  pain NEUROLOGIC: No headache, dizziness  PSYCHIATRIC: No overt anxiety or sadness  Vitals:   12/18/17 1123  BP: (!) 99/56  Pulse: 67  Resp: 18  Temp: 98.6 F (37 C)   Body mass index is 37.66 kg/m. Physical Exam  GENERAL APPEARANCE: Alert, conversant, No acute distress  SKIN: No diaphoresis rash HEENT: Unremarkable RESPIRATORY: Breathing is even, unlabored. Lung sounds are clear   CARDIOVASCULAR: Heart RRR no murmurs, rubs or gallops. No peripheral edema  GASTROINTESTINAL: Abdomen is soft, non-tender, not distended w/ normal bowel sounds.  GENITOURINARY: Bladder non tender, not distended  MUSCULOSKELETAL: Status post amputation left great toe NEUROLOGIC: Cranial nerves 2-12 grossly intact. Moves all extremities PSYCHIATRIC: Mood and affect appropriate to situation, no behavioral issues  Patient Active Problem List   Diagnosis Date Noted  . Endocarditis of mitral valve   . Left foot infection   . MSSA bacteremia 12/08/2017  . Cellulitis of toe, left   . Fever   . Right flank pain   . ESRD (end stage renal disease) ([place]) 09/20/2017  . ESRD (end stage renal disease) on dialysis ([place]) 08/07/2017  . Nonhealing surgical wound 02/29/2016  . Clotted dialysis access ([place]) 01/14/2016  . Removal of staples 07/19/2012  . Other complications due to renal dialysis device, implant, and graft 06/27/2012  . End stage renal disease ([place]) 06/27/2012    CMP     Component Value Date/Time   NA 133 (L) 12/17/2017 0437   K 5.9 (H) 12/17/2017 0437   CL 96 (L) 12/17/2017 0437   CO2 21 (L) 12/17/2017 0437   GLUCOSE 91 12/17/2017 0437   BUN 77 (H) 12/17/2017 0437   CREATININE 13.48 (H) 12/17/2017 0437   CALCIUM 8.6 (L) 12/17/2017 0437   PROT 8.3 (H) 12/07/2017 1115   ALBUMIN 2.5 (L) 12/17/2017 0437   AST 19 12/07/2017 1115   ALT 14 12/07/2017 1115   ALKPHOS 56 12/07/2017 1115   BILITOT 1.1 12/07/2017 1115   GFRNONAA 4 (L) 12/17/2017 0437   GFRAA 4 (L) 12/17/2017 3903  Recent Labs     12/14/17 0520 12/15/17 0519 12/17/17 0437  NA 136 135 133*  K 5.0 4.8 5.9*  CL 97* 96* 96*  CO2 25 25 21*  GLUCOSE 77 75 91  BUN 62* 43* 77*  CREATININE 11.43* 9.11* 13.48*  CALCIUM 8.3* 8.5* 8.6*  PHOS 5.5* 5.6* 5.7*   Recent Labs    08/08/17 0337  12/07/17 1115 12/14/17 0520 12/15/17 0519 12/17/17 0437  AST 12*  --  19  --   --   --   ALT 9  --  14  --   --   --   ALKPHOS 87  --  56  --   --   --   BILITOT 0.8  --  1.1  --   --   --   PROT 7.2  --  8.3*  --   --   --   ALBUMIN 3.2*   < > 3.0* 2.4* 2.6* 2.5*   < > = values in this interval not displayed.   Recent Labs    08/14/17 0111  12/07/17 1115  12/13/17 0351 12/14/17 0520 12/15/17 0519 12/17/17 0437 12/17/17 0715  WBC 6.5   < > 8.1   < > 4.2 4.5  --   --  6.2  NEUTROABS 5.0  --  7.4  --   --   --   --   --   --   HGB 8.9*   < > 10.9*   < > 9.3* 9.1* 9.8* 9.4* 9.5*  HCT 28.9*   < > 36.0*   < > 30.2* 29.9* 32.8* 31.0* 32.3*  MCV 98.6   < > 96.3   < > 94.7 95.2  --   --  97.6  PLT 135*   < > 92*   < > 123* 122*  --   --  172   < > = values in this interval not displayed.   No results for input(s): CHOL, LDLCALC, TRIG in the last 8760 hours.  Invalid input(s): HCL No results found for: MICROALBUR TSH - 17.98 No results found for: HGBA1C No results found for: CHOL, HDL, LDLCALC, LDLDIRECT, TRIG, CHOLHDL  Significant Diagnostic Results in last 30 days:  Ct Abdomen Pelvis Wo Contrast  Result Date: 12/09/2017 CLINICAL DATA:  BacteremiaBacteremia Right flank pain EXAM: CT ABDOMEN AND PELVIS WITHOUT CONTRAST TECHNIQUE: Multidetector CT imaging of the abdomen and pelvis was performed following the standard protocol without IV contrast. COMPARISON:  CT abdomen 5 21 2010 FINDINGS: Lower chest: Lung bases are clear. Hepatobiliary: No focal hepatic lesion on noncontrast exam. Pancreas: Pancreas is normal. No ductal dilatation. No pancreatic inflammation. Spleen: Normal spleen Adrenals/urinary tract: Adrenal glands  normal. Kidneys are atrophic. Multiple renal calculi. No hydronephrosis. Bladder is decompressed Stomach/Bowel: Stomach, small-bowel cecum normal. There is a ileostomy in the RIGHT lower quadrant. Small parastomal hernia. No bowel obstruction. Colon rectosigmoid colon normal. Vascular/Lymphatic: Abdominal aorta is normal caliber. Grafts within the LEFT common iliac vein. There is central venous line in the RIGHT common iliac vein extending the IVC. Venous collaterals along the abdominal wall. Reproductive: Prostate normal Other: No free fluid or free air Musculoskeletal: Diffuse increase in bone sclerosis suggesting renal osteodystrophy IMPRESSION: 1. No clear acute findings in the abdomen pelvis. 2. Atrophy kidneys. 3. RIGHT lower quadrant ileostomy without bowel obstruction. 4. 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 No evidence of acute infection or inflammation. 6. Large-bore central venous line from a RIGHT groin approach Electronically  Signed   By: Suzy Bouchard M.D.   On: 12/09/2017 00:46   Dg Chest 2 View  Result Date: 12/07/2017 CLINICAL DATA:  52 year old male with a history of fever EXAM: CHEST - 2 VIEW COMPARISON:  08/07/2017 FINDINGS: Cardiomediastinal silhouette unchanged in size and contour. Low lung volumes with coarsened interstitial markings. No pleural effusion. No confluent airspace disease. No pneumothorax. IMPRESSION: Low lung volumes, with either chronic changes or early edema. Electronically Signed   By: Corrie Mckusick D.O.   On: 12/07/2017 11:44   Mr Foot Left Wo Contrast  Result Date: 12/07/2017 CLINICAL DATA:  Soft tissue wound of the great toe. Evaluate for osteomyelitis. EXAM: MRI OF THE LEFT FOOT WITHOUT CONTRAST TECHNIQUE: Multiplanar, multisequence MR imaging of the left forefoot was performed. No intravenous contrast was administered. COMPARISON:  None. FINDINGS: Bones/Joint/Cartilage Soft tissue wound involving the plantar aspect of the great toe  extending to the IP joint. Small first IP joint joint effusion with marrow edema on either side of the joint concerning for septic arthritis. No fracture or dislocation. Normal alignment. No other joint effusion. Advanced degenerative changes of midfoot which are partially visualized. Ligaments Collateral ligaments are intact.  Lisfranc ligament is intact. Muscles and Tendons Flexor, peroneal and extensor compartment tendons are intact. Diffuse muscle atrophy throughout the left foot. Soft tissue No fluid collection or hematoma.  No soft tissue mass. IMPRESSION: 1. Soft tissue wound involving the plantar aspect of the great toe extending to the IP joint. Small first IP joint joint effusion with marrow edema on either side of the joint concerning for septic arthritis and osteomyelitis. Electronically Signed   By: Kathreen Devoid   On: 12/07/2017 19:02   Dg Foot Complete Left  Result Date: 12/07/2017 CLINICAL DATA:  Toe infection. EXAM: LEFT FOOT - COMPLETE 3+ VIEW COMPARISON:  None. FINDINGS: Soft tissue deformity noted of the distal great toe. I do not see plain radiographic evidence of osteomyelitis. There is some chronic post traumatic deformity of the inter phalangeal joint of the great toe. Degenerative changes are noted in the midfoot, possibly neuropathic or post traumatic. IMPRESSION: Soft tissue abnormality of the great toe without plain radiographic evidence of underlying osteomyelitis. Extensive degenerative changes of the midfoot, possibly neuropathic or post traumatic. Electronically Signed   By: Nelson Chimes M.D.   On: 12/07/2017 12:50    Assessment and Plan  Sepsis/left great toe osteomyelitis/MSSA bacteremia/endocarditis- patient presented with fever and tachypnea; MRI revealed soft tissue wound involving the plantar aspect of the great toe with marrow edema concerning for septic arthritis and osteomyelitis; patient status post left great toe amputation; TEE with mitral valve small mobile  vegetation; patient had been placed on vancomycin and ceftazidime and was transitioned over to cefazolin and will need 6 weeks of IV Ancef with hemodialysis SNF- admitted for OT/PT and for 6 weeks IV antibiotics with Ancef  Right flank pain- status post right nephrostomy tube in the past; CT abdomen pelvis without acute findings; right flank pain resolved  End-stage renal disease on hemodialysis- access issues, patient had difficulty with blood flow; vascular surgery consulted and plan for an attempted [facility] exchange but patient's shunt began operating again so procedure was canceled SNF- Next hemodialysis 12/16  Anemia of chronic disease- patient will receive Aranesp; hemoglobin currently 9.8 SNF- will monitor CBC  Hypothyroidism SNF- continue Synthroid 125 mcg daily  PAD SNF- continue Plavix 75 mg daily  Anxiety SNF- appears controlled; continue Ativan 0.5 mg every 6 as needed for no  more than 2 weeks     I spent greater than 45 minutes;> 50% of time with patient was spent reviewing records, labs, tests and studies, counseling and developing plan of care  Noah Delaine. Sheppard Coil, MD

## 2017-12-18 NOTE — Progress Notes (Signed)
Endocarditis Evaluation   Infective endocarditis (IE) is associated with a high rate of complications and mortality.  Specific aspects of clinical management are critical to optimizing the outcome of patients with IE. Therefore, the Pharmacy Residency Program at [facility] has initiated an evidence-based consult aimed at improving the management of IE at [facility].     Comments  Consult to ID Outpatient follow-up appointment to be made prior to discharge.  Utilization of ID recommended antibiotic therapy   Surgery Evaluation Cardiothoracic surgery consult if the following indications are present:  - Heart failure associated to valve dysfunction - Endocarditis complicated by heart block or aortic abscess - Left-sided endocarditis caused by S. aureus, fungal, or multi-drug resistant organisms - Left-sided endocarditis with severe valve dysfunction - Large vegetation (>1 cm) on aortic or mitral valve - Persistence of infection 5-7 days after initiation of appropriate antibiotic therapy - Recurrent emboli and persistent vegetations despite appropriate antibiotic therapy - Any history with left-sided embolization with persistent vegetations on TEE  Consult electrophysiologist if presence of implanted cardiac device (pacemaker, ICD)   Consult to cardiology if left ventricular ejection fraction is <40% Evaluation for appropriate heart failure medications upon discharge: - ACE/ARB - Diuretic - Beta blocker  Outpatient follow-up appointment to be made prior to discharge.   Based on the pharmacist evaluation of this patient, the following are recommended to complete a thorough evaluation and care plan to reflect guideline recommended therapy:  1. ID outpatient follow-up appointment to be made prior to discharge 2. Surgery evaluation by cardiothoracic surgery with:  Left-sided endocarditis caused by S. aureus, fungal, or multi-drug resistant organisms  Vertis Kelch, PharmD PGY1 Pharmacy  Resident Phone [phone] 12/18/2017       12:54 PM

## 2017-12-19 ENCOUNTER — Ambulatory Visit (INDEPENDENT_AMBULATORY_CARE_PROVIDER_SITE_OTHER): Payer: Medicare Other | Admitting: Orthopedic Surgery

## 2017-12-19 ENCOUNTER — Encounter (INDEPENDENT_AMBULATORY_CARE_PROVIDER_SITE_OTHER): Payer: Self-pay | Admitting: Orthopedic Surgery

## 2017-12-19 VITALS — Ht 71.0 in | Wt 270.0 lb

## 2017-12-19 DIAGNOSIS — Z89412 Acquired absence of left great toe: Secondary | ICD-10-CM

## 2017-12-19 DIAGNOSIS — B9561 Methicillin susceptible Staphylococcus aureus infection as the cause of diseases classified elsewhere: Secondary | ICD-10-CM | POA: Insufficient documentation

## 2017-12-19 NOTE — Progress Notes (Signed)
Post-Op Visit Note   Patient: [name]           Date of Birth: 07-09-1965           MRN: [id number] Visit Date: 12/19/2017 PCP: [name], MD  Chief Complaint:  Chief Complaint  Patient presents with  . Left Foot - Routine Post Op    12/11/17 left great toe amputation     HPI:  HPI Patient is a 52 year old gentleman seen status post left great toe amputation. Has been weight bearing through heel for transfers.  Ortho Exam Incision well approximated with sutures. No drainage. No surrounding erythema. No sign of infection.  Visit Diagnoses:  1. Left great toe amputee ([place])     Plan: dry dressing applied. Weight bearing through heel for transfers only. Begin daily dial soap cleansing. Dry dressings. Follow up in 1 week for suture removal.  Follow-Up Instructions: Return in about 1 week (around 12/26/2017).   Imaging: No results found.  Orders:  No orders of the defined types were placed in this encounter.  No orders of the defined types were placed in this encounter.    PMFS History: Patient Active Problem List   Diagnosis Date Noted  . Left great toe amputee ([place]) 12/19/2017  . Endocarditis of mitral valve   . Left foot infection   . MSSA bacteremia 12/08/2017  . Fever   . Right flank pain   . ESRD (end stage renal disease) ([place]) 09/20/2017  . ESRD (end stage renal disease) on dialysis ([place]) 08/07/2017  . Nonhealing surgical wound 02/29/2016  . Clotted dialysis access ([place]) 01/14/2016  . Removal of staples 07/19/2012  . Other complications due to renal dialysis device, implant, and graft 06/27/2012  . End stage renal disease ([place]) 06/27/2012   Past Medical History:  Diagnosis Date  . Anemia   . Anxiety   . Constipation   . End stage renal failure on dialysis [facility])    "[place]; MWF" (02/29/2016)  . GERD (gastroesophageal reflux disease)   . Grand mal seizure ([place]) X 4   last 1998 (02/29/2016)  . Heart murmur    no problems with per pt.  - nephrologist and Dr. Coletta Memos  . History of blood transfusion 2000s   "I was having blood loss; never found out from where"  . History of kidney stones   . Hypertension    hx of - has not taken bp meds in over 2 years  . Hypothyroidism   . Insomnia   . Migraine    "due to my BP in my late 1990s; none since" (02/29/2016)  . Nonhealing surgical wound    of the left arteriovenous graft  . Pneumonia    x 2  . Shortness of breath    rarely  . Spina bifida ([place])     Family History  Problem Relation Age of Onset  . Diabetes Mother   . Hypertension Mother   . Heart disease Mother        before age 25  . Diabetes Father   . Heart attack Father        X's 3  . Diabetes Sister   . Bipolar disorder Sister     Past Surgical History:  Procedure Laterality Date  . AMPUTATION Left 12/11/2017   Procedure: Left Great Toe Amputation;  Surgeon: [name], MD;  Location: [place];  Service: Orthopedics;  Laterality: Left;  . APPENDECTOMY    . ARTERIOVENOUS GRAFT PLACEMENT Left  10/16/2012   left femoral goretex graft         Dr Donnetta Hutching  . AV FISTULA PLACEMENT Left 06/27/2012   Procedure: EXPLORATORY LEFT THY-GRAFT PSEUDO-ANEURYSM;  Surgeon: Conrad [place] Gate, MD;  Location: [place];  Service: Vascular;  Laterality: Left;  Revision of left Arteriovenus gortex graft in thigh.  . COLONOSCOPY    . I&D EXTREMITY Left 02/29/2016   Procedure: DEBRIDEMENT LEFT THIGH WOUND;  Surgeon: Waynetta Sandy, MD;  Location: [place];  Service: Vascular;  Laterality: Left;  . ILEOSTOMY  1970s  . INCISION AND DRAINAGE Left 10/16/2012   Procedure: INCISION AND Debridement left thigh graft;  Surgeon: Rosetta Posner, MD;  Location: [place];  Service: Vascular;  Laterality: Left;  . INSERTION OF DIALYSIS CATHETER    . INSERTION OF DIALYSIS CATHETER  01/14/2016   Procedure: INSERTION OF DIALYSIS CATHETER;  Surgeon: Waynetta Sandy, MD;  Location: [place];  Service: Vascular;;  . INSERTION OF DIALYSIS CATHETER Right  08/07/2017   Procedure: INSERTION OF DIALYSIS CATHETER;  Surgeon: Waynetta Sandy, MD;  Location: [place];  Service: Vascular;  Laterality: Right;  . INSERTION OF ILIAC STENT Left 01/14/2016   Procedure: INSERTION OF ILIAC STENT;  Surgeon: Waynetta Sandy, MD;  Location: [place];  Service: Vascular;  Laterality: Left;  . INTRAOPERATIVE ARTERIOGRAM Left 01/14/2016   Procedure: INTRA OPERATIVE ARTERIOGRAM;  Surgeon: Waynetta Sandy, MD;  Location: [place];  Service: Vascular;  Laterality: Left;  . LITHOTRIPSY  x 3   "& a laser treatment" (02/29/2016)  . NEPHROSTOMY Bilateral 1968  . PATELLAR TENDON REPAIR Right 1990s   "big incision"  . PERIPHERAL VASCULAR CATHETERIZATION  01/14/2016   Procedure: A/V SHUNTOGRAM;  Surgeon: Waynetta Sandy, MD;  Location: [place];  Service: Vascular;;  . REVISION OF ARTERIOVENOUS GORETEX GRAFT Left 10/16/2012   Procedure: REVISION OF LEFT FEMORAL LOOP ARTERIOVENOUS GORETEX GRAFT;  Surgeon: Rosetta Posner, MD;  Location: [place];  Service: Vascular;  Laterality: Left;  . REVISION OF ARTERIOVENOUS GORETEX GRAFT Left 02/11/2015   Procedure: EXCISION OF SMALL SEGMENT OF EXPOSED LEFT THIGH NON FUNCTIONING  ARTERIOVENOUS GORETEX GRAFT;  Surgeon: Mal Misty, MD;  Location: [place];  Service: Vascular;  Laterality: Left;  . REVISION OF ARTERIOVENOUS GORETEX GRAFT Left 01/14/2016   Procedure: REVISION OF ARTERIOVENOUS GORETEX GRAFT;  Surgeon: Waynetta Sandy, MD;  Location: [place];  Service: Vascular;  Laterality: Left;  . REVISION OF ARTERIOVENOUS GORETEX GRAFT Left 02/29/2016   thigh/pt report  . REVISION OF ARTERIOVENOUS GORETEX GRAFT Left 02/29/2016   Procedure: POSSIBLE REVISION OF LEFT THIGH ARTERIOVENOUS GORETEX GRAFT;  Surgeon: Waynetta Sandy, MD;  Location: [place];  Service: Vascular;  Laterality: Left;  . TEE WITHOUT CARDIOVERSION N/A 12/14/2017   Procedure: TRANSESOPHAGEAL ECHOCARDIOGRAM (TEE);  Surgeon: Acie Fredrickson Wonda Cheng, MD;   Location: [facility] ENDOSCOPY;  Service: Cardiovascular;  Laterality: N/A;  . THROMBECTOMY W/ EMBOLECTOMY Left 01/14/2016   Procedure: THROMBECTOMY ARTERIOVENOUS GORE-TEX Left thigh GRAFT;  Surgeon: Waynetta Sandy, MD;  Location: [place];  Service: Vascular;  Laterality: Left;  . TONGUE SURGERY  ~ 1990   tongue-tie release   . ULTRASOUND GUIDANCE FOR VASCULAR ACCESS Right 08/07/2017   Procedure: ULTRASOUND GUIDANCE FOR VASCULAR CANNULATION RIGHT FEMORAL VEIN AND LEFT AV FEMORAL GRAFT;  Surgeon: Waynetta Sandy, MD;  Location: [place];  Service: Vascular;  Laterality: Right;  . UPPER EXTREMITY VENOGRAPHY Bilateral 08/09/2017   Procedure: UPPER EXTREMITY VENOGRAPHY;  Surgeon: Harold Barban  W, MD;  Location: [place] CV LAB;  Service: Cardiovascular;  Laterality: Bilateral;  . VENOGRAM N/A 09/20/2017   Procedure: RIGHT COMMON FEMORAL ARTERY EXPLORATION.  CANNULATION RIGHT COMMON FEMORAL VEIN. VENOGRAM CENTRAL ULTRA SOUND GUIDED RIGHT FEMORAL VEIN TIMES TWO.;  Surgeon: Waynetta Sandy, MD;  Location: [place];  Service: Vascular;  Laterality: N/A;  . WOUND DEBRIDEMENT Left 02/29/2016   thigh   Social History   Occupational History  . Occupation: Disabled  Tobacco Use  . Smoking status: Never Smoker  . Smokeless tobacco: Never Used  Substance and Sexual Activity  . Alcohol use: Not Currently    Alcohol/week: 0.0 standard drinks    Comment: 02/29/2016 "nothing since  the mid 1990s  . Drug use: Not Currently    Types: Marijuana    Comment: 02/29/2016 "nothing since ~ 1995"  . Sexual activity: Never

## 2017-12-21 ENCOUNTER — Observation Stay (HOSPITAL_COMMUNITY): Payer: Medicare Other | Admitting: Certified Registered"

## 2017-12-21 ENCOUNTER — Encounter (HOSPITAL_COMMUNITY)
Admission: EM | Disposition: A | Discharge status: Home / Self Care | Payer: Self-pay | Source: Home / Self Care | Attending: Emergency Medicine

## 2017-12-21 ENCOUNTER — Encounter (HOSPITAL_COMMUNITY): Payer: Self-pay | Admitting: Emergency Medicine

## 2017-12-21 ENCOUNTER — Observation Stay (HOSPITAL_COMMUNITY)
Admission: EM | Admit: 2017-12-21 | Discharge: 2017-12-23 | Disposition: A | Discharge status: Home / Self Care | Payer: Medicare Other | Attending: Internal Medicine | Admitting: Internal Medicine

## 2017-12-21 DIAGNOSIS — Z8669 Personal history of other diseases of the nervous system and sense organs: Secondary | ICD-10-CM | POA: Insufficient documentation

## 2017-12-21 DIAGNOSIS — Z881 Allergy status to other antibiotic agents status: Secondary | ICD-10-CM | POA: Insufficient documentation

## 2017-12-21 DIAGNOSIS — E039 Hypothyroidism, unspecified: Secondary | ICD-10-CM | POA: Diagnosis not present

## 2017-12-21 DIAGNOSIS — Y831 Surgical operation with implant of artificial internal device as the cause of abnormal reaction of the patient, or of later complication, without mention of misadventure at the time of the procedure: Secondary | ICD-10-CM | POA: Diagnosis not present

## 2017-12-21 DIAGNOSIS — K219 Gastro-esophageal reflux disease without esophagitis: Secondary | ICD-10-CM | POA: Diagnosis not present

## 2017-12-21 DIAGNOSIS — Q059 Spina bifida, unspecified: Secondary | ICD-10-CM | POA: Insufficient documentation

## 2017-12-21 DIAGNOSIS — Z8249 Family history of ischemic heart disease and other diseases of the circulatory system: Secondary | ICD-10-CM | POA: Insufficient documentation

## 2017-12-21 DIAGNOSIS — G4700 Insomnia, unspecified: Secondary | ICD-10-CM | POA: Diagnosis not present

## 2017-12-21 DIAGNOSIS — T82898A Other specified complication of vascular prosthetic devices, implants and grafts, initial encounter: Secondary | ICD-10-CM

## 2017-12-21 DIAGNOSIS — T8249XA Other complication of vascular dialysis catheter, initial encounter: Principal | ICD-10-CM | POA: Insufficient documentation

## 2017-12-21 DIAGNOSIS — Z79899 Other long term (current) drug therapy: Secondary | ICD-10-CM | POA: Diagnosis not present

## 2017-12-21 DIAGNOSIS — Z888 Allergy status to other drugs, medicaments and biological substances status: Secondary | ICD-10-CM | POA: Insufficient documentation

## 2017-12-21 DIAGNOSIS — Z7902 Long term (current) use of antithrombotics/antiplatelets: Secondary | ICD-10-CM | POA: Insufficient documentation

## 2017-12-21 DIAGNOSIS — N186 End stage renal disease: Secondary | ICD-10-CM | POA: Diagnosis present

## 2017-12-21 DIAGNOSIS — Z992 Dependence on renal dialysis: Secondary | ICD-10-CM | POA: Insufficient documentation

## 2017-12-21 DIAGNOSIS — F419 Anxiety disorder, unspecified: Secondary | ICD-10-CM | POA: Diagnosis present

## 2017-12-21 DIAGNOSIS — E875 Hyperkalemia: Secondary | ICD-10-CM | POA: Diagnosis not present

## 2017-12-21 DIAGNOSIS — I120 Hypertensive chronic kidney disease with stage 5 chronic kidney disease or end stage renal disease: Secondary | ICD-10-CM | POA: Diagnosis not present

## 2017-12-21 DIAGNOSIS — Z79890 Hormone replacement therapy (postmenopausal): Secondary | ICD-10-CM | POA: Diagnosis not present

## 2017-12-21 DIAGNOSIS — Z95820 Peripheral vascular angioplasty status with implants and grafts: Secondary | ICD-10-CM | POA: Insufficient documentation

## 2017-12-21 DIAGNOSIS — Z91041 Radiographic dye allergy status: Secondary | ICD-10-CM | POA: Insufficient documentation

## 2017-12-21 DIAGNOSIS — T82868A Thrombosis of vascular prosthetic devices, implants and grafts, initial encounter: Secondary | ICD-10-CM

## 2017-12-21 DIAGNOSIS — Z882 Allergy status to sulfonamides status: Secondary | ICD-10-CM | POA: Insufficient documentation

## 2017-12-21 DIAGNOSIS — D649 Anemia, unspecified: Secondary | ICD-10-CM | POA: Diagnosis present

## 2017-12-21 HISTORY — PX: INSERTION OF DIALYSIS CATHETER: SHX1324

## 2017-12-21 LAB — CBC
HCT: 32.9 % — ABNORMAL LOW (ref 39.0–52.0)
Hemoglobin: 9.6 g/dL — ABNORMAL LOW (ref 13.0–17.0)
MCH: 28.7 pg (ref 26.0–34.0)
MCHC: 29.2 g/dL — ABNORMAL LOW (ref 30.0–36.0)
MCV: 98.2 fL (ref 80.0–100.0)
Platelets: 146 10*3/uL — ABNORMAL LOW (ref 150–400)
RBC: 3.35 MIL/uL — ABNORMAL LOW (ref 4.22–5.81)
RDW: 16.9 % — ABNORMAL HIGH (ref 11.5–15.5)
WBC: 5.5 10*3/uL (ref 4.0–10.5)
nRBC: 0 % (ref 0.0–0.2)

## 2017-12-21 LAB — BASIC METABOLIC PANEL
Anion gap: 16 — ABNORMAL HIGH (ref 5–15)
BUN: 74 mg/dL — ABNORMAL HIGH (ref 6–20)
CO2: 23 mmol/L (ref 22–32)
Calcium: 9.8 mg/dL (ref 8.9–10.3)
Chloride: 95 mmol/L — ABNORMAL LOW (ref 98–111)
Creatinine, Ser: 12.84 mg/dL — ABNORMAL HIGH (ref 0.61–1.24)
GFR calc Af Amer: 5 mL/min — ABNORMAL LOW (ref >60)
GFR calc non Af Amer: 4 mL/min — ABNORMAL LOW (ref >60)
Glucose, Bld: 89 mg/dL (ref 70–99)
Potassium: 5.7 mmol/L — ABNORMAL HIGH (ref 3.5–5.1)
Sodium: 134 mmol/L — ABNORMAL LOW (ref 135–145)

## 2017-12-21 LAB — GLUCOSE, CAPILLARY: Glucose-Capillary: 89 mg/dL (ref 70–99)

## 2017-12-21 SURGERY — INSERTION OF DIALYSIS CATHETER
Anesthesia: Monitor Anesthesia Care | Laterality: Right

## 2017-12-21 MED ORDER — FLUTICASONE PROPIONATE 50 MCG/ACT NA SUSP
2.0000 | NASAL | Status: DC | PRN
  Filled 2017-12-21: qty 16

## 2017-12-21 MED ORDER — MIDODRINE HCL 5 MG PO TABS
5.0000 mg | ORAL_TABLET | Freq: Three times a day (TID) | ORAL | Status: DC
  Administered 2017-12-22 (×2): 5 mg via ORAL
  Filled 2017-12-21 (×3): qty 1

## 2017-12-21 MED ORDER — ONDANSETRON HCL 4 MG/2ML IJ SOLN
INTRAMUSCULAR | Status: AC
  Filled 2017-12-21: qty 2

## 2017-12-21 MED ORDER — PROPOFOL 500 MG/50ML IV EMUL
INTRAVENOUS | Status: DC | PRN
  Administered 2017-12-21: 100 ug/kg/min via INTRAVENOUS

## 2017-12-21 MED ORDER — OXYCODONE HCL 5 MG PO TABS
10.0000 mg | ORAL_TABLET | ORAL | Status: DC | PRN
  Administered 2017-12-21 – 2017-12-23 (×5): 10 mg via ORAL
  Filled 2017-12-21 (×5): qty 2

## 2017-12-21 MED ORDER — 0.9 % SODIUM CHLORIDE (POUR BTL) OPTIME
TOPICAL | Status: DC | PRN
  Administered 2017-12-21: 1000 mL

## 2017-12-21 MED ORDER — EPINEPHRINE PF 1 MG/ML IJ SOLN
0.5000 ug/min | INTRAVENOUS | Status: DC
  Filled 2017-12-21: qty 4

## 2017-12-21 MED ORDER — LORAZEPAM 0.5 MG PO TABS
0.5000 mg | ORAL_TABLET | Freq: Four times a day (QID) | ORAL | Status: DC | PRN
  Administered 2017-12-21 – 2017-12-23 (×4): 0.5 mg via ORAL
  Filled 2017-12-21 (×4): qty 1

## 2017-12-21 MED ORDER — PROPOFOL 10 MG/ML IV BOLUS
INTRAVENOUS | Status: DC | PRN
  Administered 2017-12-21: 30 mg via INTRAVENOUS
  Administered 2017-12-21: 40 mg via INTRAVENOUS

## 2017-12-21 MED ORDER — MIDAZOLAM HCL 2 MG/2ML IJ SOLN
INTRAMUSCULAR | Status: DC | PRN
  Administered 2017-12-21: 2 mg via INTRAVENOUS

## 2017-12-21 MED ORDER — KETAMINE HCL 10 MG/ML IJ SOLN
INTRAMUSCULAR | Status: DC | PRN
  Administered 2017-12-21 (×2): 10 mg via INTRAVENOUS

## 2017-12-21 MED ORDER — PROMETHAZINE HCL 25 MG PO TABS
25.0000 mg | ORAL_TABLET | Freq: Two times a day (BID) | ORAL | Status: DC
  Administered 2017-12-21 – 2017-12-22 (×3): 25 mg via ORAL
  Filled 2017-12-21 (×3): qty 1

## 2017-12-21 MED ORDER — FENTANYL CITRATE (PF) 250 MCG/5ML IJ SOLN
INTRAMUSCULAR | Status: DC | PRN
  Administered 2017-12-21 (×2): 50 ug via INTRAVENOUS

## 2017-12-21 MED ORDER — ACETAMINOPHEN 650 MG RE SUPP: 650.0000 mg | Freq: Four times a day (QID) | RECTAL | Status: DC | PRN

## 2017-12-21 MED ORDER — KETAMINE HCL 50 MG/5ML IJ SOSY
PREFILLED_SYRINGE | INTRAMUSCULAR | Status: AC
  Filled 2017-12-21: qty 5

## 2017-12-21 MED ORDER — CEFAZOLIN SODIUM-DEXTROSE 2-4 GM/100ML-% IV SOLN
2.0000 g | INTRAVENOUS | Status: DC
  Administered 2017-12-21: 2 g via INTRAVENOUS
  Filled 2017-12-21: qty 100

## 2017-12-21 MED ORDER — ACETAMINOPHEN 325 MG PO TABS
650.0000 mg | ORAL_TABLET | Freq: Four times a day (QID) | ORAL | Status: DC | PRN
  Administered 2017-12-23: 650 mg via ORAL
  Filled 2017-12-21: qty 2

## 2017-12-21 MED ORDER — SODIUM ZIRCONIUM CYCLOSILICATE 10 G PO PACK
10.0000 g | PACK | Freq: Once | ORAL | Status: AC
  Administered 2017-12-22: 10 g via ORAL
  Filled 2017-12-21: qty 1

## 2017-12-21 MED ORDER — CHLORHEXIDINE GLUCONATE CLOTH 2 % EX PADS
6.0000 | MEDICATED_PAD | Freq: Every day | CUTANEOUS | Status: DC
  Administered 2017-12-22: 6 via TOPICAL

## 2017-12-21 MED ORDER — POLYETHYLENE GLYCOL 3350 17 G PO PACK: 17.0000 g | PACK | Freq: Every day | ORAL | Status: DC | PRN

## 2017-12-21 MED ORDER — SODIUM POLYSTYRENE SULFONATE 15 GM/60ML PO SUSP: 30.0000 g | Freq: Once | ORAL | Status: DC

## 2017-12-21 MED ORDER — ZOLPIDEM TARTRATE 5 MG PO TABS
10.0000 mg | ORAL_TABLET | Freq: Every day | ORAL | Status: DC
  Administered 2017-12-21 – 2017-12-22 (×2): 10 mg via ORAL
  Filled 2017-12-21 (×2): qty 2

## 2017-12-21 MED ORDER — LIDOCAINE 2% (20 MG/ML) 5 ML SYRINGE
INTRAMUSCULAR | Status: AC
  Filled 2017-12-21: qty 5

## 2017-12-21 MED ORDER — LEVOTHYROXINE SODIUM 25 MCG PO TABS
125.0000 ug | ORAL_TABLET | Freq: Every day | ORAL | Status: DC
  Administered 2017-12-22 – 2017-12-23 (×2): 125 ug via ORAL
  Filled 2017-12-21 (×3): qty 1

## 2017-12-21 MED ORDER — LIDOCAINE-EPINEPHRINE (PF) 1 %-1:200000 IJ SOLN
INTRAMUSCULAR | Status: AC
  Filled 2017-12-21: qty 30

## 2017-12-21 MED ORDER — PHENYLEPHRINE 40 MCG/ML (10ML) SYRINGE FOR IV PUSH (FOR BLOOD PRESSURE SUPPORT)
PREFILLED_SYRINGE | INTRAVENOUS | Status: DC | PRN
  Administered 2017-12-21: 120 ug via INTRAVENOUS
  Administered 2017-12-21: 200 ug via INTRAVENOUS
  Administered 2017-12-21: 80 ug via INTRAVENOUS

## 2017-12-21 MED ORDER — FAMOTIDINE 20 MG PO TABS
40.0000 mg | ORAL_TABLET | Freq: Every day | ORAL | Status: DC
  Administered 2017-12-22: 40 mg via ORAL
  Filled 2017-12-21: qty 2

## 2017-12-21 MED ORDER — SODIUM CHLORIDE 0.9 % IV SOLN
INTRAVENOUS | Status: DC | PRN
  Administered 2017-12-21: 19:00:00

## 2017-12-21 MED ORDER — SODIUM CHLORIDE 0.9 % IV SOLN
INTRAVENOUS | Status: DC
  Administered 2017-12-21 (×2): via INTRAVENOUS

## 2017-12-21 MED ORDER — CLOPIDOGREL BISULFATE 75 MG PO TABS
75.0000 mg | ORAL_TABLET | Freq: Every day | ORAL | Status: DC
  Administered 2017-12-22 – 2017-12-23 (×2): 75 mg via ORAL
  Filled 2017-12-21 (×2): qty 1

## 2017-12-21 MED ORDER — MIDAZOLAM HCL 2 MG/2ML IJ SOLN
INTRAMUSCULAR | Status: AC
  Filled 2017-12-21: qty 2

## 2017-12-21 MED ORDER — GLYCOPYRROLATE PF 0.2 MG/ML IJ SOSY
PREFILLED_SYRINGE | INTRAMUSCULAR | Status: DC | PRN
  Administered 2017-12-21: .2 mg via INTRAVENOUS

## 2017-12-21 MED ORDER — SODIUM CHLORIDE 0.9 % IV SOLN
INTRAVENOUS | Status: AC
  Filled 2017-12-21: qty 1.2

## 2017-12-21 MED ORDER — FENTANYL CITRATE (PF) 100 MCG/2ML IJ SOLN
50.0000 ug | INTRAMUSCULAR | Status: AC | PRN
  Administered 2017-12-21 – 2017-12-22 (×2): 50 ug via INTRAVENOUS
  Filled 2017-12-21 (×2): qty 2

## 2017-12-21 MED ORDER — PHENYLEPHRINE 40 MCG/ML (10ML) SYRINGE FOR IV PUSH (FOR BLOOD PRESSURE SUPPORT)
PREFILLED_SYRINGE | INTRAVENOUS | Status: AC
  Filled 2017-12-21: qty 20

## 2017-12-21 MED ORDER — FENTANYL CITRATE (PF) 250 MCG/5ML IJ SOLN
INTRAMUSCULAR | Status: AC
  Filled 2017-12-21: qty 5

## 2017-12-21 MED ORDER — GLYCOPYRROLATE PF 0.2 MG/ML IJ SOSY
PREFILLED_SYRINGE | INTRAMUSCULAR | Status: AC
  Filled 2017-12-21: qty 1

## 2017-12-21 MED ORDER — ONDANSETRON HCL 4 MG PO TABS: 4.0000 mg | ORAL_TABLET | Freq: Four times a day (QID) | ORAL | Status: DC | PRN

## 2017-12-21 MED ORDER — CEFAZOLIN IV (FOR PTA / DISCHARGE USE ONLY): 2.0000 g | INTRAVENOUS | Status: DC

## 2017-12-21 MED ORDER — FENTANYL CITRATE (PF) 100 MCG/2ML IJ SOLN: 50.0000 ug | Freq: Once | INTRAMUSCULAR | Status: DC

## 2017-12-21 MED ORDER — SODIUM POLYSTYRENE SULFONATE PO POWD
15.0000 g | ORAL | Status: DC
  Filled 2017-12-21: qty 15

## 2017-12-21 MED ORDER — LIDOCAINE-EPINEPHRINE (PF) 1 %-1:200000 IJ SOLN
INTRAMUSCULAR | Status: DC | PRN
  Administered 2017-12-21: 30 mL

## 2017-12-21 MED ORDER — EPHEDRINE SULFATE-NACL 50-0.9 MG/10ML-% IV SOSY
PREFILLED_SYRINGE | INTRAVENOUS | Status: DC | PRN
  Administered 2017-12-21: 5 mg via INTRAVENOUS
  Administered 2017-12-21: 10 mg via INTRAVENOUS

## 2017-12-21 MED ORDER — DEXAMETHASONE SODIUM PHOSPHATE 10 MG/ML IJ SOLN
INTRAMUSCULAR | Status: AC
  Filled 2017-12-21: qty 1

## 2017-12-21 MED ORDER — OXYCODONE-ACETAMINOPHEN 5-325 MG PO TABS
3.0000 | ORAL_TABLET | Freq: Once | ORAL | Status: AC
  Administered 2017-12-21: 3 via ORAL
  Filled 2017-12-21: qty 3

## 2017-12-21 MED ORDER — ONDANSETRON HCL 4 MG/2ML IJ SOLN
INTRAMUSCULAR | Status: DC | PRN
  Administered 2017-12-21: 4 mg via INTRAVENOUS

## 2017-12-21 MED ORDER — ONDANSETRON HCL 4 MG/2ML IJ SOLN: 4.0000 mg | Freq: Four times a day (QID) | INTRAMUSCULAR | Status: DC | PRN

## 2017-12-21 MED ORDER — TRAZODONE HCL 100 MG PO TABS
100.0000 mg | ORAL_TABLET | Freq: Every day | ORAL | Status: DC
  Administered 2017-12-21 – 2017-12-22 (×2): 100 mg via ORAL
  Filled 2017-12-21 (×2): qty 1

## 2017-12-21 MED ORDER — HEPARIN SODIUM (PORCINE) 1000 UNIT/ML IJ SOLN
INTRAMUSCULAR | Status: AC
  Filled 2017-12-21: qty 1

## 2017-12-21 MED ORDER — SODIUM CHLORIDE (PF) 0.9 % IJ SOLN
INTRAVENOUS | Status: DC | PRN
  Administered 2017-12-21: 50 mL via INTRAMUSCULAR

## 2017-12-21 MED ORDER — LIDOCAINE 2% (20 MG/ML) 5 ML SYRINGE
INTRAMUSCULAR | Status: DC | PRN
  Administered 2017-12-21: 60 mg via INTRAVENOUS

## 2017-12-21 MED ORDER — CALCIUM ACETATE (PHOS BINDER) 667 MG PO CAPS
2668.0000 mg | ORAL_CAPSULE | Freq: Three times a day (TID) | ORAL | Status: DC
  Administered 2017-12-22 (×2): 2668 mg via ORAL
  Filled 2017-12-21 (×3): qty 4

## 2017-12-21 MED ORDER — EPHEDRINE 5 MG/ML INJ
INTRAVENOUS | Status: AC
  Filled 2017-12-21: qty 10

## 2017-12-21 SURGICAL SUPPLY — 46 items
BAG DECANTER FOR FLEXI CONT (MISCELLANEOUS) ×2 IMPLANT
BIOPATCH RED 1 DISK 7.0 (GAUZE/BANDAGES/DRESSINGS) ×2 IMPLANT
CATH PALINDROME RT-P 15FX19CM (CATHETERS) IMPLANT
CATH PALINDROME RT-P 15FX23CM (CATHETERS) IMPLANT
CATH PALINDROME RT-P 15FX28CM (CATHETERS) IMPLANT
CATH PALINDROME RT-P 15FX55CM (CATHETERS) ×2 IMPLANT
CATH STRAIGHT 5FR 65CM (CATHETERS) IMPLANT
COVER DOME SNAP 22 D (MISCELLANEOUS) ×2 IMPLANT
COVER PROBE W GEL 5X96 (DRAPES) ×2 IMPLANT
COVER SURGICAL LIGHT HANDLE (MISCELLANEOUS) ×2 IMPLANT
COVER WAND RF STERILE (DRAPES) ×2 IMPLANT
DECANTER SPIKE VIAL GLASS SM (MISCELLANEOUS) ×2 IMPLANT
DERMABOND ADVANCED (GAUZE/BANDAGES/DRESSINGS) ×1
DERMABOND ADVANCED .7 DNX12 (GAUZE/BANDAGES/DRESSINGS) ×1 IMPLANT
DEVICE TORQUE KENDALL .025-038 (MISCELLANEOUS) ×2 IMPLANT
DRAPE C-ARM 42X72 X-RAY (DRAPES) ×2 IMPLANT
DRAPE CHEST BREAST 15X10 FENES (DRAPES) ×2 IMPLANT
GAUZE 4X4 16PLY RFD (DISPOSABLE) ×2 IMPLANT
GLOVE BIO SURGEON STRL SZ7.5 (GLOVE) ×2 IMPLANT
GLOVE BIOGEL PI IND STRL 8 (GLOVE) ×1 IMPLANT
GLOVE BIOGEL PI INDICATOR 8 (GLOVE) ×1
GOWN STRL REUS W/ TWL LRG LVL3 (GOWN DISPOSABLE) ×2 IMPLANT
GOWN STRL REUS W/ TWL XL LVL3 (GOWN DISPOSABLE) ×2 IMPLANT
GOWN STRL REUS W/TWL LRG LVL3 (GOWN DISPOSABLE) ×2
GOWN STRL REUS W/TWL XL LVL3 (GOWN DISPOSABLE) ×2
KIT BASIN OR (CUSTOM PROCEDURE TRAY) ×2 IMPLANT
KIT TURNOVER KIT B (KITS) ×2 IMPLANT
NEEDLE 18GX1X1/2 (RX/OR ONLY) (NEEDLE) ×2 IMPLANT
NEEDLE HYPO 25GX1X1/2 BEV (NEEDLE) ×2 IMPLANT
NS IRRIG 1000ML POUR BTL (IV SOLUTION) ×2 IMPLANT
PACK SURGICAL SETUP 50X90 (CUSTOM PROCEDURE TRAY) ×2 IMPLANT
PAD ARMBOARD 7.5X6 YLW CONV (MISCELLANEOUS) ×4 IMPLANT
SET MICROPUNCTURE 5F STIFF (MISCELLANEOUS) IMPLANT
SOAP 2 % CHG 4 OZ (WOUND CARE) ×2 IMPLANT
SUT ETHILON 3 0 PS 1 (SUTURE) ×6 IMPLANT
SUT MNCRL AB 4-0 PS2 18 (SUTURE) ×2 IMPLANT
SYR 10ML LL (SYRINGE) ×2 IMPLANT
SYR 20CC LL (SYRINGE) ×6 IMPLANT
SYR 5ML LL (SYRINGE) ×2 IMPLANT
SYR CONTROL 10ML LL (SYRINGE) ×2 IMPLANT
TOWEL GREEN STERILE (TOWEL DISPOSABLE) ×2 IMPLANT
TOWEL GREEN STERILE FF (TOWEL DISPOSABLE) ×2 IMPLANT
WATER STERILE IRR 1000ML POUR (IV SOLUTION) ×2 IMPLANT
WIRE AMPLATZ SS-J .035X180CM (WIRE) IMPLANT
WIRE AMPLATZ SS-J .035X260CM (WIRE) ×2 IMPLANT
WIRE STIFF LUNDERQUIST 260CM (WIRE) ×2 IMPLANT

## 2017-12-21 NOTE — ED Provider Notes (Signed)
[place] EMERGENCY DEPARTMENT Provider Note   CSN: [id number] Arrival date & time: 12/21/17  1219     History   Chief Complaint No chief complaint on file.   HPI [name] is a 52 y.o. male.  HPI Patient is a 52 year old male sent to the emergency department for clotted off right groin temporary dialysis catheter.  Patient has longstanding history of end-stage renal disease and is failed the majority of his dialysis access sites.  His last dialysis was 2 days ago.  He has no significant complaints at this time.  Denies groin pain.  He understands that his options for dialysis access are limited and that they are nearing the end of his dialysis options.  He has no other complaints at this time.   Past Medical History:  Diagnosis Date  . Anemia   . Anxiety   . Constipation   . End stage renal failure on dialysis [facility])    "[place]; MWF" (02/29/2016)  . GERD (gastroesophageal reflux disease)   . Grand mal seizure ([place]) X 4   last 1998 (02/29/2016)  . Heart murmur    no problems with per pt. - nephrologist and Dr. Coletta Memos  . History of blood transfusion 2000s   "I was having blood loss; never found out from where"  . History of kidney stones   . Hypertension    hx of - has not taken bp meds in over 2 years  . Hypothyroidism   . Insomnia   . Migraine    "due to my BP in my late 1990s; none since" (02/29/2016)  . Nonhealing surgical wound    of the left arteriovenous graft  . Pneumonia    x 2  . Shortness of breath    rarely  . Spina bifida [facility])     Patient Active Problem List   Diagnosis Date Noted  . Left great toe amputee ([place]) 12/19/2017  . Endocarditis of mitral valve   . Left foot infection   . MSSA bacteremia 12/08/2017  . Fever   . Right flank pain   . ESRD (end stage renal disease) ([place]) 09/20/2017  . ESRD (end stage renal disease) on dialysis ([place]) 08/07/2017  . Nonhealing surgical wound 02/29/2016  . Clotted dialysis  access ([place]) 01/14/2016  . Removal of staples 07/19/2012  . Other complications due to renal dialysis device, implant, and graft 06/27/2012  . End stage renal disease ([place]) 06/27/2012    Past Surgical History:  Procedure Laterality Date  . AMPUTATION Left 12/11/2017   Procedure: Left Great Toe Amputation;  Surgeon: [name], MD;  Location: [place];  Service: Orthopedics;  Laterality: Left;  . APPENDECTOMY    . ARTERIOVENOUS GRAFT PLACEMENT Left 10/16/2012   left femoral goretex graft         Dr Donnetta Hutching  . AV FISTULA PLACEMENT Left 06/27/2012   Procedure: EXPLORATORY LEFT THY-GRAFT PSEUDO-ANEURYSM;  Surgeon: Conrad Cromwell, MD;  Location: [place];  Service: Vascular;  Laterality: Left;  Revision of left Arteriovenus gortex graft in thigh.  . COLONOSCOPY    . I&D EXTREMITY Left 02/29/2016   Procedure: DEBRIDEMENT LEFT THIGH WOUND;  Surgeon: Waynetta Sandy, MD;  Location: [place];  Service: Vascular;  Laterality: Left;  . ILEOSTOMY  1970s  . INCISION AND DRAINAGE Left 10/16/2012   Procedure: INCISION AND Debridement left thigh graft;  Surgeon: Rosetta Posner, MD;  Location: [place];  Service: Vascular;  Laterality: Left;  . INSERTION  OF DIALYSIS CATHETER    . INSERTION OF DIALYSIS CATHETER  01/14/2016   Procedure: INSERTION OF DIALYSIS CATHETER;  Surgeon: Waynetta Sandy, MD;  Location: [place];  Service: Vascular;;  . INSERTION OF DIALYSIS CATHETER Right 08/07/2017   Procedure: INSERTION OF DIALYSIS CATHETER;  Surgeon: Waynetta Sandy, MD;  Location: [place];  Service: Vascular;  Laterality: Right;  . INSERTION OF ILIAC STENT Left 01/14/2016   Procedure: INSERTION OF ILIAC STENT;  Surgeon: Waynetta Sandy, MD;  Location: [place];  Service: Vascular;  Laterality: Left;  . INTRAOPERATIVE ARTERIOGRAM Left 01/14/2016   Procedure: INTRA OPERATIVE ARTERIOGRAM;  Surgeon: Waynetta Sandy, MD;  Location: [place];  Service: Vascular;  Laterality: Left;  . LITHOTRIPSY  x 3    "& a laser treatment" (02/29/2016)  . NEPHROSTOMY Bilateral 1968  . PATELLAR TENDON REPAIR Right 1990s   "big incision"  . PERIPHERAL VASCULAR CATHETERIZATION  01/14/2016   Procedure: A/V SHUNTOGRAM;  Surgeon: Waynetta Sandy, MD;  Location: [place];  Service: Vascular;;  . REVISION OF ARTERIOVENOUS GORETEX GRAFT Left 10/16/2012   Procedure: REVISION OF LEFT FEMORAL LOOP ARTERIOVENOUS GORETEX GRAFT;  Surgeon: Rosetta Posner, MD;  Location: [place];  Service: Vascular;  Laterality: Left;  . REVISION OF ARTERIOVENOUS GORETEX GRAFT Left 02/11/2015   Procedure: EXCISION OF SMALL SEGMENT OF EXPOSED LEFT THIGH NON FUNCTIONING  ARTERIOVENOUS GORETEX GRAFT;  Surgeon: Mal Misty, MD;  Location: [place];  Service: Vascular;  Laterality: Left;  . REVISION OF ARTERIOVENOUS GORETEX GRAFT Left 01/14/2016   Procedure: REVISION OF ARTERIOVENOUS GORETEX GRAFT;  Surgeon: Waynetta Sandy, MD;  Location: [place];  Service: Vascular;  Laterality: Left;  . REVISION OF ARTERIOVENOUS GORETEX GRAFT Left 02/29/2016   thigh/pt report  . REVISION OF ARTERIOVENOUS GORETEX GRAFT Left 02/29/2016   Procedure: POSSIBLE REVISION OF LEFT THIGH ARTERIOVENOUS GORETEX GRAFT;  Surgeon: Waynetta Sandy, MD;  Location: [place];  Service: Vascular;  Laterality: Left;  . TEE WITHOUT CARDIOVERSION N/A 12/14/2017   Procedure: TRANSESOPHAGEAL ECHOCARDIOGRAM (TEE);  Surgeon: Acie Fredrickson Wonda Cheng, MD;  Location: [facility] ENDOSCOPY;  Service: Cardiovascular;  Laterality: N/A;  . THROMBECTOMY W/ EMBOLECTOMY Left 01/14/2016   Procedure: THROMBECTOMY ARTERIOVENOUS GORE-TEX Left thigh GRAFT;  Surgeon: Waynetta Sandy, MD;  Location: [place];  Service: Vascular;  Laterality: Left;  . TONGUE SURGERY  ~ 1990   tongue-tie release   . ULTRASOUND GUIDANCE FOR VASCULAR ACCESS Right 08/07/2017   Procedure: ULTRASOUND GUIDANCE FOR VASCULAR CANNULATION RIGHT FEMORAL VEIN AND LEFT AV FEMORAL GRAFT;  Surgeon: Waynetta Sandy, MD;  Location:  [place];  Service: Vascular;  Laterality: Right;  . UPPER EXTREMITY VENOGRAPHY Bilateral 08/09/2017   Procedure: UPPER EXTREMITY VENOGRAPHY;  Surgeon: Serafina Mitchell, MD;  Location: [place] CV LAB;  Service: Cardiovascular;  Laterality: Bilateral;  . VENOGRAM N/A 09/20/2017   Procedure: RIGHT COMMON FEMORAL ARTERY EXPLORATION.  CANNULATION RIGHT COMMON FEMORAL VEIN. VENOGRAM CENTRAL ULTRA SOUND GUIDED RIGHT FEMORAL VEIN TIMES TWO.;  Surgeon: Waynetta Sandy, MD;  Location: [place];  Service: Vascular;  Laterality: N/A;  . WOUND DEBRIDEMENT Left 02/29/2016   thigh        Home Medications    Prior to Admission medications   Medication Sig Start Date End Date Taking? Authorizing Provider  calcium acetate (PHOSLO) 667 MG capsule Take 2,001-2,708 mg by mouth See admin instructions. Take 2708 mg by mouth 3 times daily with meals, take 2001 mg by mouth with snacks  [provider]  ceFAZolin (ANCEF) IVPB Inject 2 g into the vein every Monday, Wednesday, and Friday with hemodialysis. Indication:  MSSA Bacteremia Last Day of Therapy:  01/19/2018 Labs - Once weekly:  CBC/D and BMP, Labs - Every other week:  ESR and CRP 12/17/17 01/22/18  Cristal Ford, DO  clopidogrel (PLAVIX) 75 MG tablet Take 1 tablet (75 mg total) by mouth daily. 01/17/16   Alvia Grove, PA-C  famotidine (PEPCID) 40 MG tablet Take 40 mg by mouth daily at 2 PM.     [provider]  fluticasone (FLONASE) 50 MCG/ACT nasal spray Place 2 sprays into both nostrils as needed for allergies or rhinitis (congestion).     [provider]  levothyroxine (SYNTHROID, LEVOTHROID) 125 MCG tablet Take 125 mcg by mouth daily before breakfast.     [provider]  LORazepam (ATIVAN) 0.5 MG tablet Take 1 tablet (0.5 mg total) by mouth every 6 (six) hours as needed for anxiety. 12/17/17   Mikhail, Velta Addison, DO  methocarbamol (ROBAXIN) 500 MG tablet Take 1 tablet (500 mg total) by mouth every 6 (six)  hours as needed for muscle spasms. 12/17/17   Mikhail, Velta Addison, DO  midodrine (PROAMATINE) 5 MG tablet Take 1 tablet (5 mg total) by mouth 3 (three) times daily with meals. 01/18/16   Alvia Grove, PA-C  oxyCODONE-acetaminophen (PERCOCET) 10-325 MG tablet Take 1 tablet by mouth every 6 (six) hours as needed. 12/17/17   Mikhail, Velta Addison, DO  polyethylene glycol (MIRALAX / GLYCOLAX) packet Take 17 g by mouth daily as needed for mild constipation. 12/17/17   Mikhail, Velta Addison, DO  promethazine (PHENERGAN) 25 MG tablet Take 25 mg by mouth 2 (two) times daily.     [provider]  sodium polystyrene (KAYEXALATE) powder Take 15 g by mouth See admin instructions. Take 15 g (mixed in water) by mouth on Saturdays and Sundays    [provider]  traZODone (DESYREL) 100 MG tablet Take 100 mg by mouth at bedtime.    [provider]  zolpidem (AMBIEN) 10 MG tablet Take 10 mg by mouth at bedtime.     [provider]    Family History Family History  Problem Relation Age of Onset  . Diabetes Mother   . Hypertension Mother   . Heart disease Mother        before age 25  . Diabetes Father   . Heart attack Father        X's 3  . Diabetes Sister   . Bipolar disorder Sister     Social History Social History   Tobacco Use  . Smoking status: Never Smoker  . Smokeless tobacco: Never Used  Substance Use Topics  . Alcohol use: Not Currently    Alcohol/week: 0.0 standard drinks    Comment: 02/29/2016 "nothing since  the mid 1990s  . Drug use: Not Currently    Types: Marijuana    Comment: 02/29/2016 "nothing since ~ 1995"     Allergies   Furadantin [nitrofurantoin]; Mandelamine [methenamine]; Noroxin [norfloxacin]; Carmine; Contrast media [iodinated diagnostic agents]; Hydrocodone; Metrizamide; Sulfa antibiotics; and Sulfur   Review of Systems Review of Systems  All other systems reviewed and are negative.    Physical Exam Updated Vital Signs Temp 99.1 F  (37.3 C) (Oral)   Physical Exam Vitals signs and nursing note reviewed.  Constitutional:      Appearance: He is well-developed.  HENT:     Head: Normocephalic and atraumatic.  Neck:  Musculoskeletal: Normal range of motion.  Cardiovascular:     Rate and Rhythm: Normal rate and regular rhythm.     Heart sounds: Normal heart sounds.  Pulmonary:     Effort: Pulmonary effort is normal. No respiratory distress.     Breath sounds: Normal breath sounds.  Abdominal:     General: There is no distension.     Palpations: Abdomen is soft.     Tenderness: There is no abdominal tenderness.  Musculoskeletal: Normal range of motion.     Comments: Right groin catheter without surrounding signs of infection  Skin:    General: Skin is warm and dry.  Neurological:     Mental Status: He is alert and oriented to person, place, and time.  Psychiatric:        Judgment: Judgment normal.      ED Treatments / Results  Labs (all labs ordered are listed, but only abnormal results are displayed) Labs Reviewed  CBC - Abnormal; Notable for the following components:      Result Value   RBC 3.35 (*)    Hemoglobin 9.6 (*)    HCT 32.9 (*)    MCHC 29.2 (*)    RDW 16.9 (*)    Platelets 146 (*)    All other components within normal limits  BASIC METABOLIC PANEL    EKG None  Radiology No results found.  Procedures Procedures (including critical care time)  Medications Ordered in ED Medications  oxyCODONE-acetaminophen (PERCOCET/ROXICET) 5-325 MG per tablet 3 tablet (3 tablets Oral Given 12/21/17 1300)     Initial Impression / Assessment and Plan / ED Course  I have reviewed the triage vital signs and the nursing notes.  Pertinent labs & imaging results that were available during my care of the patient were reviewed by me and considered in my medical decision making (see chart for details).     Discussed case with vascular surgery who will plan on operative access either by rewiring  the current catheter or alternative.  Patient will be admitted to hospitalist service.  Stable while in the emergency department.  Consultants: Dr. Apolonio Schneiders surgery [facility] hospitalist  Final Clinical Impressions(s) / ED Diagnoses   Final diagnoses:  Clotted dialysis access, initial encounter [facility])    ED Discharge Orders    None       Jola Schmidt, MD 12/25/17 [phone]

## 2017-12-21 NOTE — ED Provider Notes (Signed)
[facility] EMERGENCY DEPARTMENT Provider Note   CSN: [id number] Arrival date & time: 12/21/17  1219     History   Chief Complaint No chief complaint on file.   HPI [name] is a 52 y.o. male.  HPI Patient is a 52-year-old male with a history of end-stage renal disease who presents the emergency department with a clotted right groin dialysis catheter.  He was unable to have dialysis today.  His last dialysis was on Wednesday.  Patient has failed access to his arms upper extremities left thigh and most options in his right lower extremity.  He has no significant complaints at this time except for mild left shoulder pain.  He was sent to the emergency department from dialysis for vascular access.  His vascular surgeon is Dr. Cain   Past Medical History:  Diagnosis Date  . Anemia   . Anxiety   . Constipation   . End stage renal failure on dialysis (HCC)    "[place]; MWF" (02/29/2016)  . GERD (gastroesophageal reflux disease)   . Grand mal seizure (HCC) X 4   last 1998 (02/29/2016)  . Heart murmur    no problems with per pt. - nephrologist and Dr. Bouska  . History of blood transfusion 2000s   "I was having blood loss; never found out from where"  . History of kidney stones   . Hypertension    hx of - has not taken bp meds in over 2 years  . Hypothyroidism   . Insomnia   . Migraine    "due to my BP in my late 1990s; none since" (02/29/2016)  . Nonhealing surgical wound    of the left arteriovenous graft  . Pneumonia    x 2  . Shortness of breath    rarely  . Spina bifida (HCC)     Patient Active Problem List   Diagnosis Date Noted  . Left great toe amputee (HCC) 12/19/2017  . Endocarditis of mitral valve   . Left foot infection   . MSSA bacteremia 12/08/2017  . Fever   . Right flank pain   . ESRD (end stage renal disease) (HCC) 09/20/2017  . ESRD (end stage renal disease) on dialysis (HCC) 08/07/2017  . Nonhealing surgical wound  02/29/2016  . Clotted dialysis access (HCC) 01/14/2016  . Removal of staples 07/19/2012  . Other complications due to renal dialysis device, implant, and graft 06/27/2012  . End stage renal disease (HCC) 06/27/2012    Past Surgical History:  Procedure Laterality Date  . AMPUTATION Left 12/11/2017   Procedure: Left Great Toe Amputation;  Surgeon: Duda, Marcus V, MD;  Location: [facility] OR;  Service: Orthopedics;  Laterality: Left;  . APPENDECTOMY    . ARTERIOVENOUS GRAFT PLACEMENT Left 10/16/2012   left femoral goretex graft         Dr Early  . AV FISTULA PLACEMENT Left 06/27/2012   Procedure: EXPLORATORY LEFT THY-GRAFT PSEUDO-ANEURYSM;  Surgeon: Brian L Chen, MD;  Location: [facility] OR;  Service: Vascular;  Laterality: Left;  Revision of left Arteriovenus gortex graft in thigh.  . COLONOSCOPY    . I&D EXTREMITY Left 02/29/2016   Procedure: DEBRIDEMENT LEFT THIGH WOUND;  Surgeon: Brandon Christopher Cain, MD;  Location: [facility] OR;  Service: Vascular;  Laterality: Left;  . ILEOSTOMY  1970s  . INCISION AND DRAINAGE Left 10/16/2012   Procedure: INCISION AND Debridement left thigh graft;  Surgeon: Todd F Early, MD;  Location: [facility] OR;  Service: Vascular;    Laterality: Left;  . INSERTION OF DIALYSIS CATHETER    . INSERTION OF DIALYSIS CATHETER  01/14/2016   Procedure: INSERTION OF DIALYSIS CATHETER;  Surgeon: Brandon Christopher Cain, MD;  Location: [facility];  Service: Vascular;;  . INSERTION OF DIALYSIS CATHETER Right 08/07/2017   Procedure: INSERTION OF DIALYSIS CATHETER;  Surgeon: Cain, Brandon Christopher, MD;  Location: [facility];  Service: Vascular;  Laterality: Right;  . INSERTION OF ILIAC STENT Left 01/14/2016   Procedure: INSERTION OF ILIAC STENT;  Surgeon: Brandon Christopher Cain, MD;  Location: [facility];  Service: Vascular;  Laterality: Left;  . INTRAOPERATIVE ARTERIOGRAM Left 01/14/2016   Procedure: INTRA OPERATIVE ARTERIOGRAM;  Surgeon: Brandon Christopher Cain, MD;  Location: [facility] OR;  Service: Vascular;   Laterality: Left;  . LITHOTRIPSY  x 3   "& a laser treatment" (02/29/2016)  . NEPHROSTOMY Bilateral 1968  . PATELLAR TENDON REPAIR Right 1990s   "big incision"  . PERIPHERAL VASCULAR CATHETERIZATION  01/14/2016   Procedure: A/V SHUNTOGRAM;  Surgeon: Brandon Christopher Cain, MD;  [facility]: [facility];  Service: Vascular;;  . REVISION OF ARTERIOVENOUS GORETEX GRAFT Left 10/16/2012   Procedure: REVISION OF LEFT FEMORAL LOOP ARTERIOVENOUS GORETEX GRAFT;  Surgeon: Todd F Early, MD;  [facility]: [facility];  Service: Vascular;  Laterality: Left;  . REVISION OF ARTERIOVENOUS GORETEX GRAFT Left 02/11/2015   Procedure: EXCISION OF SMALL SEGMENT OF EXPOSED LEFT THIGH NON FUNCTIONING  ARTERIOVENOUS GORETEX GRAFT;  Surgeon: James D Lawson, MD;  Location: [facility];  Service: Vascular;  Laterality: Left;  . REVISION OF ARTERIOVENOUS GORETEX GRAFT Left 01/14/2016   Procedure: REVISION OF ARTERIOVENOUS GORETEX GRAFT;  Surgeon: Brandon Christopher Cain, MD;  [facility];  Service: Vascular;  Laterality: Left;  . REVISION OF ARTERIOVENOUS GORETEX GRAFT Left 02/29/2016   thigh/pt report  . REVISION OF ARTERIOVENOUS GORETEX GRAFT Left 02/29/2016   Procedure: POSSIBLE REVISION OF LEFT THIGH ARTERIOVENOUS GORETEX GRAFT;  Surgeon: Brandon Christopher Cain, MD;  [facility]: [facility];  Service: Vascular;  Laterality: Left;  . TEE WITHOUT CARDIOVERSION N/A 12/14/2017   Procedure: TRANSESOPHAGEAL ECHOCARDIOGRAM (TEE);  Surgeon: Nahser, Philip J, MD;  Location: [facility];  Service: Cardiovascular;  Laterality: N/A;  . THROMBECTOMY W/ EMBOLECTOMY Left 01/14/2016   Procedure: THROMBECTOMY ARTERIOVENOUS GORE-TEX Left thigh GRAFT;  Surgeon: Brandon Christopher Cain, MD;  [facility]: [facility];  Service: Vascular;  Laterality: Left;  . TONGUE SURGERY  ~ 1990   tongue-tie release   . ULTRASOUND GUIDANCE FOR VASCULAR ACCESS Right 08/07/2017   Procedure: ULTRASOUND GUIDANCE FOR VASCULAR CANNULATION RIGHT FEMORAL VEIN AND LEFT AV FEMORAL GRAFT;  Surgeon:  Cain, Brandon Christopher, MD;  Location: [facility];  Service: Vascular;  Laterality: Right;  . UPPER EXTREMITY VENOGRAPHY Bilateral 08/09/2017   Procedure: UPPER EXTREMITY VENOGRAPHY;  Surgeon: Brabham, Vance W, MD;  Location: [facility];  Service: Cardiovascular;  Laterality: Bilateral;  . VENOGRAM N/A 09/20/2017   Procedure: RIGHT COMMON FEMORAL ARTERY EXPLORATION.  CANNULATION RIGHT COMMON FEMORAL VEIN. VENOGRAM CENTRAL ULTRA SOUND GUIDED RIGHT FEMORAL VEIN TIMES TWO.;  Surgeon: Cain, Brandon Christopher, MD;  Location: [facility];  Service: Vascular;  Laterality: N/A;  . WOUND DEBRIDEMENT Left 02/29/2016   thigh        Home Medications    Prior to Admission medications   Medication Sig Start Date End Date Taking? Authorizing Provider  calcium acetate (PHOSLO) 667 MG capsule Take 2,001-2,708 mg by mouth See admin instructions. Take 2708 mg by mouth 3 times daily with meals, take 2001 mg by mouth   with snacks    [provider]  ceFAZolin (ANCEF) IVPB Inject 2 g into the vein every Monday, Wednesday, and Friday with hemodialysis. Indication:  MSSA Bacteremia Last Day of Therapy:  01/19/2018 Labs - Once weekly:  CBC/D and BMP, Labs - Every other week:  ESR and CRP 12/17/17 01/22/18  Cristal Ford, DO  clopidogrel (PLAVIX) 75 MG tablet Take 1 tablet (75 mg total) by mouth daily. 01/17/16   Alvia Grove, PA-C  famotidine (PEPCID) 40 MG tablet Take 40 mg by mouth daily at 2 PM.     [provider]  fluticasone (FLONASE) 50 MCG/ACT nasal spray Place 2 sprays into both nostrils as needed for allergies or rhinitis (congestion).     [provider]  levothyroxine (SYNTHROID, LEVOTHROID) 125 MCG tablet Take 125 mcg by mouth daily before breakfast.     [provider]  LORazepam (ATIVAN) 0.5 MG tablet Take 1 tablet (0.5 mg total) by mouth every 6 (six) hours as needed for anxiety. 12/17/17   Mikhail, Velta Addison, DO  methocarbamol (ROBAXIN) 500 MG tablet Take 1  tablet (500 mg total) by mouth every 6 (six) hours as needed for muscle spasms. 12/17/17   Mikhail, Velta Addison, DO  midodrine (PROAMATINE) 5 MG tablet Take 1 tablet (5 mg total) by mouth 3 (three) times daily with meals. 01/18/16   Alvia Grove, PA-C  oxyCODONE-acetaminophen (PERCOCET) 10-325 MG tablet Take 1 tablet by mouth every 6 (six) hours as needed. 12/17/17   Mikhail, Velta Addison, DO  polyethylene glycol (MIRALAX / GLYCOLAX) packet Take 17 g by mouth daily as needed for mild constipation. 12/17/17   Mikhail, Velta Addison, DO  promethazine (PHENERGAN) 25 MG tablet Take 25 mg by mouth 2 (two) times daily.     [provider]  sodium polystyrene (KAYEXALATE) powder Take 15 g by mouth See admin instructions. Take 15 g (mixed in water) by mouth on Saturdays and Sundays    [provider]  traZODone (DESYREL) 100 MG tablet Take 100 mg by mouth at bedtime.    [provider]  zolpidem (AMBIEN) 10 MG tablet Take 10 mg by mouth at bedtime.     [provider]    Family History Family History  Problem Relation Age of Onset  . Diabetes Mother   . Hypertension Mother   . Heart disease Mother        before age 50  . Diabetes Father   . Heart attack Father        X's 3  . Diabetes Sister   . Bipolar disorder Sister     Social History Social History   Tobacco Use  . Smoking status: Never Smoker  . Smokeless tobacco: Never Used  Substance Use Topics  . Alcohol use: Not Currently    Alcohol/week: 0.0 standard drinks    Comment: 02/29/2016 "nothing since  the mid 1990s  . Drug use: Not Currently    Types: Marijuana    Comment: 02/29/2016 "nothing since ~ 1995"     Allergies   Furadantin [nitrofurantoin]; Mandelamine [methenamine]; Noroxin [norfloxacin]; Carmine; Contrast media [iodinated diagnostic agents]; Hydrocodone; Metrizamide; Sulfa antibiotics; and Sulfur   Review of Systems Review of Systems  All other systems reviewed and are  negative.    Physical Exam Updated Vital Signs Temp 99.1 F (37.3 C) (Oral)   Physical Exam Vitals signs and nursing note reviewed.  Constitutional:      Appearance: He is well-developed.  HENT:     Head: Normocephalic.  Neck:     Musculoskeletal: Normal range of motion.  Cardiovascular:     Rate and Rhythm: Normal rate.  Pulmonary:     Effort: Pulmonary effort is normal.  Abdominal:     General: There is no distension.     Palpations: Abdomen is soft.     Tenderness: There is no abdominal tenderness.  Musculoskeletal: Normal range of motion.     Comments: Dialysis access in right groin without surrounding erythema or tenderness  Neurological:     Mental Status: He is alert and oriented to person, place, and time.      ED Treatments / Results  Labs (all labs ordered are listed, but only abnormal results are displayed) Labs Reviewed  CBC  BASIC METABOLIC PANEL    EKG None  Radiology No results found.  Procedures Procedures (including critical care time)  Medications Ordered in ED Medications  oxyCODONE-acetaminophen (PERCOCET/ROXICET) 5-325 MG per tablet 3 tablet (3 tablets Oral Given 12/21/17 1300)     Initial Impression / Assessment and Plan / ED Course  I have reviewed the triage vital signs and the nursing notes.  Pertinent labs & imaging results that were available during my care of the patient were reviewed by me and considered in my medical decision making (see chart for details).     Complicated dialysis access patient.  Patient will be taken the operating room by vascular surgery today for planned exchange over a wire versus other options.  Unfortunately the patient is nearing the end of vascular options for the patient.  He understands this.  He may benefit from palliative care consultation while in the hospital for ultimate goals of care.  Patient will be admitted to the hospital at this time.  Consultants: Dr Carlis Abbott, vascular surgery  Final  Clinical Impressions(s) / ED Diagnoses   Final diagnoses:  Clotted dialysis access, initial encounter [facility])    ED Discharge Orders    None       Jola Schmidt, MD 12/21/17 1335

## 2017-12-21 NOTE — Consult Note (Signed)
Hospital Consult    Reason for Consult:  Malfunctioning tunneled right femoral dialysis catheter Referring Physician:  ED MRN #:  [id number]  History of Present Illness: This is a 52 y.o. male with multiple medical comorbidities including end-stage renal disease and spina bifida who presents with a nonfunctioning right femoral tunneled dialysis catheter.  Patient has a complex history of access and ultimately his last remaining access from a vascular standpoint is currently the right femoral tunneled catheter through a collateral.  He has had upper extremity venograms that have shown central occlusions and he also has bilateral iliac occlusions with no other options for access from our standpoint.  He presents to the ED today as a transfer from his dialysis center after stating the catheters has not been working.  He has been n.p.o. since 630 this morning.  He understands that if this does not work he would need a translumbar drain by IR.  Past Medical History:  Diagnosis Date  . Anemia   . Anxiety   . Constipation   . End stage renal failure on dialysis [facility])    "[place]; MWF" (02/29/2016)  . GERD (gastroesophageal reflux disease)   . Grand mal seizure ([place]) X 4   last 1998 (02/29/2016)  . Heart murmur    no problems with per pt. - nephrologist and Dr. Coletta Memos  . History of blood transfusion 2000s   "I was having blood loss; never found out from where"  . History of kidney stones   . Hypertension    hx of - has not taken bp meds in over 2 years  . Hypothyroidism   . Insomnia   . Migraine    "due to my BP in my late 1990s; none since" (02/29/2016)  . Nonhealing surgical wound    of the left arteriovenous graft  . Pneumonia    x 2  . Shortness of breath    rarely  . Spina bifida [facility])     Past Surgical History:  Procedure Laterality Date  . AMPUTATION Left 12/11/2017   Procedure: Left Great Toe Amputation;  Surgeon: [name], MD;  Location: [place];  Service:  Orthopedics;  Laterality: Left;  . APPENDECTOMY    . ARTERIOVENOUS GRAFT PLACEMENT Left 10/16/2012   left femoral goretex graft         Dr Donnetta Hutching  . AV FISTULA PLACEMENT Left 06/27/2012   Procedure: EXPLORATORY LEFT THY-GRAFT PSEUDO-ANEURYSM;  Surgeon: Conrad Mattituck, MD;  Location: [place];  Service: Vascular;  Laterality: Left;  Revision of left Arteriovenus gortex graft in thigh.  . COLONOSCOPY    . I&D EXTREMITY Left 02/29/2016   Procedure: DEBRIDEMENT LEFT THIGH WOUND;  Surgeon: Waynetta Sandy, MD;  Location: [place];  Service: Vascular;  Laterality: Left;  . ILEOSTOMY  1970s  . INCISION AND DRAINAGE Left 10/16/2012   Procedure: INCISION AND Debridement left thigh graft;  Surgeon: Rosetta Posner, MD;  Location: [place];  Service: Vascular;  Laterality: Left;  . INSERTION OF DIALYSIS CATHETER    . INSERTION OF DIALYSIS CATHETER  01/14/2016   Procedure: INSERTION OF DIALYSIS CATHETER;  Surgeon: Waynetta Sandy, MD;  Location: [place];  Service: Vascular;;  . INSERTION OF DIALYSIS CATHETER Right 08/07/2017   Procedure: INSERTION OF DIALYSIS CATHETER;  Surgeon: Waynetta Sandy, MD;  Location: [place];  Service: Vascular;  Laterality: Right;  . INSERTION OF ILIAC STENT Left 01/14/2016   Procedure: INSERTION OF ILIAC STENT;  Surgeon: Waynetta Sandy,  MD;  Location: [place];  Service: Vascular;  Laterality: Left;  . INTRAOPERATIVE ARTERIOGRAM Left 01/14/2016   Procedure: INTRA OPERATIVE ARTERIOGRAM;  Surgeon: Waynetta Sandy, MD;  Location: [place];  Service: Vascular;  Laterality: Left;  . LITHOTRIPSY  x 3   "& a laser treatment" (02/29/2016)  . NEPHROSTOMY Bilateral 1968  . PATELLAR TENDON REPAIR Right 1990s   "big incision"  . PERIPHERAL VASCULAR CATHETERIZATION  01/14/2016   Procedure: A/V SHUNTOGRAM;  Surgeon: Waynetta Sandy, MD;  Location: [place];  Service: Vascular;;  . REVISION OF ARTERIOVENOUS GORETEX GRAFT Left 10/16/2012   Procedure: REVISION OF LEFT  FEMORAL LOOP ARTERIOVENOUS GORETEX GRAFT;  Surgeon: Rosetta Posner, MD;  Location: [place];  Service: Vascular;  Laterality: Left;  . REVISION OF ARTERIOVENOUS GORETEX GRAFT Left 02/11/2015   Procedure: EXCISION OF SMALL SEGMENT OF EXPOSED LEFT THIGH NON FUNCTIONING  ARTERIOVENOUS GORETEX GRAFT;  Surgeon: Mal Misty, MD;  Location: [place];  Service: Vascular;  Laterality: Left;  . REVISION OF ARTERIOVENOUS GORETEX GRAFT Left 01/14/2016   Procedure: REVISION OF ARTERIOVENOUS GORETEX GRAFT;  Surgeon: Waynetta Sandy, MD;  Location: [place];  Service: Vascular;  Laterality: Left;  . REVISION OF ARTERIOVENOUS GORETEX GRAFT Left 02/29/2016   thigh/pt report  . REVISION OF ARTERIOVENOUS GORETEX GRAFT Left 02/29/2016   Procedure: POSSIBLE REVISION OF LEFT THIGH ARTERIOVENOUS GORETEX GRAFT;  Surgeon: Waynetta Sandy, MD;  Location: [place];  Service: Vascular;  Laterality: Left;  . TEE WITHOUT CARDIOVERSION N/A 12/14/2017   Procedure: TRANSESOPHAGEAL ECHOCARDIOGRAM (TEE);  Surgeon: Acie Fredrickson Wonda Cheng, MD;  Location: [facility] END[facility]COPY;  Service: Cardiovascular;  Laterality: N/A;  . THROMBECTOMY W/ EMBOLECTOMY Left 01/14/2016   Procedure: THROMBECTOMY ARTERIOVENOUS GORE-TEX Left thigh GRAFT;  Surgeon: Waynetta Sandy, MD;  Location: [place];  Service: Vascular;  Laterality: Left;  . TONGUE SURGERY  ~ 1990   tongue-tie release   . ULTRASOUND GUIDANCE FOR VASCULAR ACCESS Right 08/07/2017   Procedure: ULTRASOUND GUIDANCE FOR VASCULAR CANNULATION RIGHT FEMORAL VEIN AND LEFT AV FEMORAL GRAFT;  Surgeon: Waynetta Sandy, MD;  Location: [place];  Service: Vascular;  Laterality: Right;  . UPPER EXTREMITY VENOGRAPHY Bilateral 08/09/2017   Procedure: UPPER EXTREMITY VENOGRAPHY;  Surgeon: Serafina Mitchell, MD;  Location: [place] [facility];  Service: Cardiovascular;  Laterality: Bilateral;  . VENOGRAM N/A 09/20/2017   Procedure: RIGHT COMMON FEMORAL ARTERY EXPLORATION.  CANNULATION RIGHT COMMON FEMORAL  VEIN. VENOGRAM CENTRAL ULTRA SOUND GUIDED RIGHT FEMORAL VEIN TIMES TWO.;  Surgeon: Waynetta Sandy, MD;  Location: [place];  Service: Vascular;  Laterality: N/A;  . WOUND DEBRIDEMENT Left 02/29/2016   thigh    Allergies  Allergen Reactions  . Furadantin [Nitrofurantoin] Other (See Comments)    UNSPECIFIED REACTION   . Mandelamine [Methenamine] Other (See Comments)    UNSPECIFIED REACTION   . Noroxin [Norfloxacin] Other (See Comments)    UNSPECIFIED REACTION   . [place]ine Nausea Only  . Contrast Media [Iodinated Diagnostic Agents] Nausea And Vomiting    Oral dye causes vomiting, IV dye is okay  . Hydrocodone Other (See Comments)    LIKELY NOT REACTION TO HYDROCODONE Pt states that after 3 weeks of taking this medication he will began to "twitch"  . Metrizamide Nausea And Vomiting    Oral dye causes vomiting, IV dye is okay  . Sulfa Antibiotics Cough    Childhood reaction - pt could not confirm that it was a cough  . Sulfur Cough    Childhood  reaction - pt could not confirm that it was a cough    Prior to Admission medications   Medication Sig Start Date End Date Taking? Authorizing Provider  calcium acetate (PHOSLO) 667 MG capsule Take 2,001-2,708 mg by mouth See admin instructions. Take 2708 mg by mouth 3 times daily with meals, take 2001 mg by mouth with snacks    [provider]  ceFAZolin (ANCEF) IVPB Inject 2 g into the vein every Monday, Wednesday, and Friday with hemodialysis. Indication:  MSSA Bacteremia Last Day of Therapy:  01/19/2018 Labs - Once weekly:  CBC/D and BMP, Labs - Every other week:  ESR and CRP 12/17/17 01/22/18  Cristal Ford, DO  clopidogrel (PLAVIX) 75 MG tablet Take 1 tablet (75 mg total) by mouth daily. 01/17/16   Alvia Grove, PA-C  famotidine (PEPCID) 40 MG tablet Take 40 mg by mouth daily at 2 PM.     [provider]  fluticasone (FLONASE) 50 MCG/ACT nasal spray Place 2 sprays into both nostrils as needed for allergies or  rhinitis (congestion).     [provider]  levothyroxine (SYNTHROID, LEVOTHROID) 125 MCG tablet Take 125 mcg by mouth daily before breakfast.     [provider]  LORazepam (ATIVAN) 0.5 MG tablet Take 1 tablet (0.5 mg total) by mouth every 6 (six) hours as needed for anxiety. 12/17/17   Mikhail, Velta Addison, DO  methocarbamol (ROBAXIN) 500 MG tablet Take 1 tablet (500 mg total) by mouth every 6 (six) hours as needed for muscle spasms. 12/17/17   Mikhail, Velta Addison, DO  midodrine (PROAMATINE) 5 MG tablet Take 1 tablet (5 mg total) by mouth 3 (three) times daily with meals. 01/18/16   Alvia Grove, PA-C  oxyCODONE-acetaminophen (PERCOCET) 10-325 MG tablet Take 1 tablet by mouth every 6 (six) hours as needed. 12/17/17   Mikhail, Velta Addison, DO  polyethylene glycol (MIRALAX / GLYCOLAX) packet Take 17 g by mouth daily as needed for mild constipation. 12/17/17   Mikhail, Velta Addison, DO  promethazine (PHENERGAN) 25 MG tablet Take 25 mg by mouth 2 (two) times daily.     [provider]  sodium polystyrene (KAYEXALATE) powder Take 15 g by mouth See admin instructions. Take 15 g (mixed in water) by mouth on Saturdays and Sundays    [provider]  traZODone (DESYREL) 100 MG tablet Take 100 mg by mouth at bedtime.    [provider]  zolpidem (AMBIEN) 10 MG tablet Take 10 mg by mouth at bedtime.     [provider]    Social History   Socioeconomic History  . Marital status: Single    Spouse name: Not on file  . Number of children: 0  . Years of education: Not on file  . Highest education level: Not on file  Occupational History  . Occupation: Disabled  Social Needs  . Financial resource strain: Not on file  . Food insecurity:    Worry: Not on file    Inability: Not on file  . Transportation needs:    Medical: Not on file    Non-medical: Not on file  Tobacco Use  . Smoking status: Never Smoker  . Smokeless tobacco: Never Used  Substance and  Sexual Activity  . Alcohol use: Not Currently    Alcohol/week: 0.0 standard drinks    Comment: 02/29/2016 "nothing since  the mid 1990s  . Drug use: Not Currently    Types: Marijuana    Comment: 02/29/2016 "nothing since ~ 1995"  . Sexual  activity: Never  Lifestyle  . Physical activity:    Days per week: Not on file    Minutes per session: Not on file  . Stress: Not on file  Relationships  . Social connections:    Talks on phone: Not on file    Gets together: Not on file    Attends religious service: Not on file    Active member of club or organization: Not on file    Attends meetings of clubs or organizations: Not on file    Relationship status: Not on file  . Intimate partner violence:    Fear of current or ex partner: Not on file    Emotionally abused: Not on file    Physically abused: Not on file    Forced sexual activity: Not on file  Other Topics Concern  . Not on file  Social History Narrative  . Not on file     Family History  Problem Relation Age of Onset  . Diabetes Mother   . Hypertension Mother   . Heart disease Mother        before age 29  . Diabetes Father   . Heart attack Father        X's 3  . Diabetes Sister   . Bipolar disorder Sister     ROS: [x] Positive   [ ] Negative   [ ] All sytems reviewed and are negative  Cardiovascular: [] chest pain/pressure [] palpitations [] SOB lying flat [] DOE [] pain in legs while walking [] pain in legs at rest [] pain in legs at night [] non-healing ulcers [] hx of DVT [] swelling in legs  Pulmonary: [] productive cough [] asthma/wheezing [] home O2  Neurologic: [] weakness in [] arms [] legs [] numbness in [] arms [] legs [] hx of CVA [] mini stroke []difficulty speaking or slurred speech [] temporary loss of vision in one eye [] dizziness  Hematologic: [] hx of cancer [] bleeding problems [] problems with blood clotting easily  Endocrine:   [] diabetes [] thyroid disease  GI []  vomiting blood [] blood in stool  GU: [] CKD/renal failure [] HD--[] M/W/F or [] T/T/S [] burning with urination [] blood in urine  Psychiatric: [] anxiety [] depression  Musculoskeletal: [] arthritis [] joint pain  Integumentary: [] rashes [] ulcers  Constitutional: [] fever [] chills   Physical Examination  Vitals:   12/21/17 1226  Temp: 99.1 F (37.3 C)   There is no height or weight on file to calculate BMI.  General:  NAD Gait: Not observed HENT: WNL, normocephalic Pulmonary: normal non-labored breathing, without Rales, rhonchi,  wheezing Cardiac: regular, without  Murmurs, rubs or gallops Abdomen: soft, NT/ND, no masses Vascular Exam/Pulses:Tunneled right femoral dialysis catheter Extremities: without ischemic changes, without Gangrene , without cellulitis; without open wounds;  Musculoskeletal: no muscle wasting or atrophy    CBC    Component Value Date/Time   WBC 6.2 12/17/2017 0715   RBC 3.31 (L) 12/17/2017 0715   HGB 9.5 (L) 12/17/2017 0715   HCT 32.3 (L) 12/17/2017 0715   PLT 172 12/17/2017 0715   MCV 97.6 12/17/2017 0715   MCH 28.7 12/17/2017 0715   MCHC 29.4 (L) 12/17/2017 0715   RDW 17.0 (H) 12/17/2017 0715   LYMPHSABS 0.3 (L) 12/07/2017 1115   MONOABS 0.4 12/07/2017 1115   EOSABS 0.0 12/07/2017 1115   BASOSABS 0.0 12/07/2017 1115    BMET    Component  Value Date/Time   NA 133 (L) 12/17/2017 0437   K 5.9 (H) 12/17/2017 0437   CL 96 (L) 12/17/2017 0437   CO2 21 (L) 12/17/2017 0437   GLUCOSE 91 12/17/2017 0437   BUN 77 (H) 12/17/2017 0437   CREATININE 13.48 (H) 12/17/2017 0437   CALCIUM 8.6 (L) 12/17/2017 0437   GFRNONAA 4 (L) 12/17/2017 0437   GFRAA 4 (L) 12/17/2017 0437    COAGS: Lab Results  Component Value Date   INR 1.14 12/13/2017   INR 1.15 12/07/2017   INR 1.05 01/14/2016     Non-Invasive Vascular Imaging:    None   ASSESSMENT/PLAN: This is a 52 y.o. male with ESRD and complex history of dialysis access that  presents with malfunctioning tunneled right femoral dialysis catheter that is placed in a collateral.  This is his last remaining option from our standpoint.  I have offered taking him to the OR this evening for an attempt at tunneled dialysis catheter exchange in the right groin but he understands there is a high likelihood that this may not work given that this catheter was placed in a collateral.  I discussed the case with Dr. Donzetta Matters who has done extensive interventions on Mr. Dollens for dialysis access and he has no other options from our standpoint.  Would need consult with radiology for an attempt at translumbar IVC catheter.  Marty Heck, MD [facility] [place] Office: [phone] Pager: [place]

## 2017-12-21 NOTE — Op Note (Addendum)
Date: December 21, 2017  Preoperative diagnosis: Malfunctioning tunneled right femoral collateral dialysis catheter  Postoperative diagnosis: Same  Procedure:  1. Exchange of tunneled right femoral collateral dialysis catheter  2. Venocavagram  Surgeon: Dr. Marty Heck, MD  Indications: Patient is a 52 year old male with end-stage renal disease and spina bifida who has been on dialysis for a number of years.  All of his dialysis options have been exhausted except for a tunneled dialysis catheter in his right groin via a femoral collateral that ultimately traverses to his IVC.  He presented today for malfunctioning catheter and presents for an attempt at exchange.  We discussed with him that there is low likelihood this is going to be a long-term option and may not work.  If this fails he needs translumbar IVC catheter.  Findings: The catheter had 180 degree kink in the collateral that made threading a wire for exchange extremely difficult.  Ultimately after using an Amplatz wire and a Lunderquist wire with excessive force, we could only get partial wire purchase inside the catheter.  In addition there was significant fibrin sheath within the catheter that made advancing a wire extremely difficult.  Ultimately the previous catheter was removed and we placed a new 55 cm femoral dialysis catheter.  Venogram suggested the tip of the catheter is in the collateral just before the IVC.  We could not advance the catheter anymore even with excessive force, wire manipulation, and additional maneuvers.   Details: The patient was taken to the operating room after informed consent was obtained.  He was placed on operative table in supine position.  After anesthesia was induced a prep timeout was done to identify patient, procedure and site.  We initially transected the catheter in his right groin removing the hub and initially tried to insert a Lunderquist wire under fluoroscopy.  Unfortunately we could  not get the wire to advance so we tried to use an Amplatz wire and it to had extreme difficulty.  After further evaluating under fluoroscopy the catheter appeared to have 180 degree kink in the collateral that was preventing [place] from getting any purchase of a wire.  Ultimately we switched back to the Lunderquist and while advancing wire and slightly pulling back the catheter we were eventually able to get partial wire purchase within the catheter but could not get the wire to traverse all the way into the IVC.  Ultimately the previous catheter was removed over the wire.  We then placed a new 55 cm femoral dialysis catheter over the wire.  Again due to a nearly 180 degree turn in the collateral was taking we had trouble getting the wire off the catheter.  With excessive force I was ultimately able to manipulate the catheter further into the collateral but still had a lot of excessive difficulty getting the catheter off.  Finally after multiple maneuvers was finally able to separate the wire.  At that point in time I injected contrast within the catheter and obtained a venacavogram that showed filling of the collateral where the catheter was located and then subsequent IVC.  I tried several different additional maneuvers after putting the wire back in and could not get the catheter to advance any further.  At that point in time the catheter withdrew and flushed easily and I felt we had done all that we could do.  The wire was then secured in place to the right groin with three 3-0 nylon sutures.  Condition: Stable  Plan: I am  concerned that this is not going to be a long-term solution and may not provide enough flow for dialysis.  As previously stated the next step would be IR evaluation for translumbar IVC catheter.  Marty Heck, MD [facility] [place] Office: [phone] Pager: [place]

## 2017-12-21 NOTE — Anesthesia Preprocedure Evaluation (Addendum)
Anesthesia Evaluation  Patient identified by MRN, date of birth, ID band Patient awake    Reviewed: Allergy & Precautions, NPO status , Patient's Chart, lab work & pertinent test results  Airway Mallampati: IV  TM Distance: >3 FB Neck ROM: Full    Dental no notable dental hx.    Pulmonary neg pulmonary ROS,    Pulmonary exam normal breath sounds clear to auscultation       Cardiovascular hypertension, Normal cardiovascular exam Rhythm:Regular Rate:Normal  ECG: SR, rate 84  ECHO:  Left ventricle: The cavity size was normal. Wall thickness was normal. Systolic function was normal. Aortic valve: Valve mobility was moderately restricted. No evidence of vegetation. Mitral valve: Mildly to moderately calcified annulus. There was a small vegetation on the atrial aspect of the base of the posterior leaflet; the appearance is consistent with vegetation. Left atrium: No evidence of thrombus in the atrial cavity or appendage. Atrial septum: No defect or patent foramen ovale was identified. Tricuspid valve: No evidence of vegetation. Pulmonic valve: No evidence of vegetation.   Neuro/Psych  Headaches, Seizures -, Well Controlled,  Anxiety Spina bifida    GI/Hepatic Neg liver ROS, GERD  Medicated and Controlled,  Endo/Other  Hypothyroidism   Renal/GU ESRF and DialysisRenal disease     Musculoskeletal negative musculoskeletal ROS (+)   Abdominal (+) + obese,   Peds  Hematology  (+) anemia ,   Anesthesia Other Findings ESRD  Reproductive/Obstetrics                            Anesthesia Physical Anesthesia Plan  ASA: IV  Anesthesia Plan: MAC   Post-op Pain Management:    Induction: Intravenous  PONV Risk Score and Plan: 1 and Propofol infusion and Treatment may vary due to age or medical condition  Airway Management Planned: Simple Face Mask  Additional Equipment:   Intra-op Plan:    Post-operative Plan:   Informed Consent: I have reviewed the patients History and Physical, chart, labs and discussed the procedure including the risks, benefits and alternatives for the proposed anesthesia with the patient or authorized representative who has indicated his/her understanding and acceptance.   Dental advisory given  Plan Discussed with: CRNA  Anesthesia Plan Comments:         Anesthesia Quick Evaluation

## 2017-12-21 NOTE — ED Triage Notes (Signed)
Patient arrived from [facility] via [facility] related to a dialysis shunt that is blocked. He was as his outpatient dialysis center ([facility]) today and did not get dialysis.

## 2017-12-21 NOTE — H&P (Signed)
History and Physical    [name] HRC:[id number] DOB: 1965/06/06 DOA: 12/21/2017  PCP: [name], MD   Patient coming from:  Dialysis center.  I have personally briefly reviewed patient's old medical records in [place]  Chief Complaint: Malfunctioning dialysis catheter.  HPI: [name] is a 52 y.o. male with medical history significant of anemia, anxiety, constipation, ESRD on MWF dialysis, GERD, history of grand mal seizures (last seen 10 IDA), urolithiasis, hypertension, hypothyroidism, insomnia, history of remote migraine headaches, history of pneumonia x2, history of spina bifida who was transferred from the dialysis center after his session has to be stopped due to malfunctioning dialysis abscess.  He has had multiple instances of failed AV fistula access, Two of them even stop working before they were used.  Vascular surgery will be seeing the patient to try to obtain a tunneled dialysis change catheter in the OR this evening.  However, there is a possibility that the patient may need IR to tried a translumbar IVC catheter.  Patient otherwise denies any acute symptoms like headache, fever, chills, sore throat, productive cough, chest pain, palpitations, dizziness, diaphoresis, recent pitting edema lower extremities, abdominal pain, diarrhea, constipation, melena or hematochezia.  No dysuria, frequency or hematuria.  Denies polyuria, polydipsia, polyphagia or blurred vision.  ED Course: Initial vital signs temperature 99.1 F, pulse 71, respiration 13, blood pressure 116/64 mmHg and O2 sat 99% on room air.  White count is 5.5, hemoglobin 9.6 and platelets 146.  Sodium 134, potassium 5.7 chloride 95 and CO2 23 mmol/L.  BUN was 74, creatinine 12.84 and glucose 89 mg/dL.  Review of Systems: As per HPI otherwise 10 point review of systems negative.   Past Medical History:  Diagnosis Date  . Anemia   . Anxiety   . Constipation   . End stage renal failure on  dialysis [facility])    "[place]; MWF" (02/29/2016)  . GERD (gastroesophageal reflux disease)   . Grand mal seizure ([place]) X 4   last 1998 (02/29/2016)  . Heart murmur    no problems with per pt. - nephrologist and Dr. Coletta Memos  . History of blood transfusion 2000s   "I was having blood loss; never found out from where"  . History of kidney stones   . Hypertension    hx of - has not taken bp meds in over 2 years  . Hypothyroidism   . Insomnia   . Migraine    "due to my BP in my late 1990s; none since" (02/29/2016)  . Nonhealing surgical wound    of the left arteriovenous graft  . Pneumonia    x 2  . Shortness of breath    rarely  . Spina bifida [facility])     Past Surgical History:  Procedure Laterality Date  . AMPUTATION Left 12/11/2017   Procedure: Left Great Toe Amputation;  Surgeon: [name], MD;  Location: [place];  Service: Orthopedics;  Laterality: Left;  . APPENDECTOMY    . ARTERIOVENOUS GRAFT PLACEMENT Left 10/16/2012   left femoral goretex graft         Dr Donnetta Hutching  . AV FISTULA PLACEMENT Left 06/27/2012   Procedure: EXPLORATORY LEFT THY-GRAFT PSEUDO-ANEURYSM;  Surgeon: Conrad Oaklawn-Sunview, MD;  Location: [place];  Service: Vascular;  Laterality: Left;  Revision of left Arteriovenus gortex graft in thigh.  . COLONOSCOPY    . I&D EXTREMITY Left 02/29/2016   Procedure: DEBRIDEMENT LEFT THIGH WOUND;  Surgeon: Waynetta Sandy, MD;  Location: [facility]  OR;  Service: Vascular;  Laterality: Left;  . ILEOSTOMY  1970s  . INCISION AND DRAINAGE Left 10/16/2012   Procedure: INCISION AND Debridement left thigh graft;  Surgeon: Rosetta Posner, MD;  Location: [place];  Service: Vascular;  Laterality: Left;  . INSERTION OF DIALYSIS CATHETER    . INSERTION OF DIALYSIS CATHETER  01/14/2016   Procedure: INSERTION OF DIALYSIS CATHETER;  Surgeon: Waynetta Sandy, MD;  Location: [place];  Service: Vascular;;  . INSERTION OF DIALYSIS CATHETER Right 08/07/2017   Procedure: INSERTION OF DIALYSIS CATHETER;   Surgeon: Waynetta Sandy, MD;  Location: [place];  Service: Vascular;  Laterality: Right;  . INSERTION OF ILIAC STENT Left 01/14/2016   Procedure: INSERTION OF ILIAC STENT;  Surgeon: Waynetta Sandy, MD;  Location: [place];  Service: Vascular;  Laterality: Left;  . INTRAOPERATIVE ARTERIOGRAM Left 01/14/2016   Procedure: INTRA OPERATIVE ARTERIOGRAM;  Surgeon: Waynetta Sandy, MD;  Location: [place];  Service: Vascular;  Laterality: Left;  . LITHOTRIPSY  x 3   "& a laser treatment" (02/29/2016)  . NEPHROSTOMY Bilateral 1968  . PATELLAR TENDON REPAIR Right 1990s   "big incision"  . PERIPHERAL VASCULAR CATHETERIZATION  01/14/2016   Procedure: A/V SHUNTOGRAM;  Surgeon: Waynetta Sandy, MD;  Location: [place];  Service: Vascular;;  . REVISION OF ARTERIOVENOUS GORETEX GRAFT Left 10/16/2012   Procedure: REVISION OF LEFT FEMORAL LOOP ARTERIOVENOUS GORETEX GRAFT;  Surgeon: Rosetta Posner, MD;  Location: [place];  Service: Vascular;  Laterality: Left;  . REVISION OF ARTERIOVENOUS GORETEX GRAFT Left 02/11/2015   Procedure: EXCISION OF SMALL SEGMENT OF EXPOSED LEFT THIGH NON FUNCTIONING  ARTERIOVENOUS GORETEX GRAFT;  Surgeon: Mal Misty, MD;  Location: [place];  Service: Vascular;  Laterality: Left;  . REVISION OF ARTERIOVENOUS GORETEX GRAFT Left 01/14/2016   Procedure: REVISION OF ARTERIOVENOUS GORETEX GRAFT;  Surgeon: Waynetta Sandy, MD;  Location: [place];  Service: Vascular;  Laterality: Left;  . REVISION OF ARTERIOVENOUS GORETEX GRAFT Left 02/29/2016   thigh/pt report  . REVISION OF ARTERIOVENOUS GORETEX GRAFT Left 02/29/2016   Procedure: POSSIBLE REVISION OF LEFT THIGH ARTERIOVENOUS GORETEX GRAFT;  Surgeon: Waynetta Sandy, MD;  Location: [place];  Service: Vascular;  Laterality: Left;  . TEE WITHOUT CARDIOVERSION N/A 12/14/2017   Procedure: TRANSESOPHAGEAL ECHOCARDIOGRAM (TEE);  Surgeon: Acie Fredrickson Wonda Cheng, MD;  Location: [facility] ENDOSCOPY;  Service: Cardiovascular;   Laterality: N/A;  . THROMBECTOMY W/ EMBOLECTOMY Left 01/14/2016   Procedure: THROMBECTOMY ARTERIOVENOUS GORE-TEX Left thigh GRAFT;  Surgeon: Waynetta Sandy, MD;  Location: [place];  Service: Vascular;  Laterality: Left;  . TONGUE SURGERY  ~ 1990   tongue-tie release   . ULTRASOUND GUIDANCE FOR VASCULAR ACCESS Right 08/07/2017   Procedure: ULTRASOUND GUIDANCE FOR VASCULAR CANNULATION RIGHT FEMORAL VEIN AND LEFT AV FEMORAL GRAFT;  Surgeon: Waynetta Sandy, MD;  Location: [place];  Service: Vascular;  Laterality: Right;  . UPPER EXTREMITY VENOGRAPHY Bilateral 08/09/2017   Procedure: UPPER EXTREMITY VENOGRAPHY;  Surgeon: Serafina Mitchell, MD;  Location: [place] CV LAB;  Service: Cardiovascular;  Laterality: Bilateral;  . VENOGRAM N/A 09/20/2017   Procedure: RIGHT COMMON FEMORAL ARTERY EXPLORATION.  CANNULATION RIGHT COMMON FEMORAL VEIN. VENOGRAM CENTRAL ULTRA SOUND GUIDED RIGHT FEMORAL VEIN TIMES TWO.;  Surgeon: Waynetta Sandy, MD;  Location: [place];  Service: Vascular;  Laterality: N/A;  . WOUND DEBRIDEMENT Left 02/29/2016   thigh     reports that he has never smoked. He has never used smokeless  tobacco. He reports previous alcohol use. He reports previous drug use. Drug: Marijuana.  Allergies  Allergen Reactions  . Furadantin [Nitrofurantoin] Other (See Comments)    UNSPECIFIED REACTION   . Mandelamine [Methenamine] Other (See Comments)    UNSPECIFIED REACTION   . Noroxin [Norfloxacin] Other (See Comments)    UNSPECIFIED REACTION   . Carmine Nausea Only  . Contrast Media [Iodinated Diagnostic Agents] Nausea And Vomiting    Oral dye causes vomiting, IV dye is okay  . Hydrocodone Other (See Comments)    Caused involuntary movement and twitching. CAN NOT TAKE DUE TO MUSCLE SPASMS AND MUSCLE TREMORS  . Metrizamide Nausea And Vomiting    Oral dye causes vomiting, IV dye is okay  . Sulfa Antibiotics Cough    Childhood reaction - pt could not confirm that it was a  cough  . Sulfur Cough    Childhood reaction - pt could not confirm that it was a cough    Family History  Problem Relation Age of Onset  . Diabetes Mother   . Hypertension Mother   . Heart disease Mother        before age 66  . Diabetes Father   . Heart attack Father        X's 3  . Diabetes Sister   . Bipolar disorder Sister    Prior to Admission medications   Medication Sig Start Date End Date Taking? Authorizing Provider  calcium acetate (PHOSLO) 667 MG capsule Take 2,001-2,708 mg by mouth See admin instructions. Take 2708 mg by mouth 3 times daily with meals, take 2001 mg by mouth with snacks    [provider]  ceFAZolin (ANCEF) IVPB Inject 2 g into the vein every Monday, Wednesday, and Friday with hemodialysis. Indication:  MSSA Bacteremia Last Day of Therapy:  01/19/2018 Labs - Once weekly:  CBC/D and BMP, Labs - Every other week:  ESR and CRP 12/17/17 01/22/18  Cristal Ford, DO  clopidogrel (PLAVIX) 75 MG tablet Take 1 tablet (75 mg total) by mouth daily. 01/17/16   Alvia Grove, PA-C  famotidine (PEPCID) 40 MG tablet Take 40 mg by mouth daily at 2 PM.     [provider]  fluticasone (FLONASE) 50 MCG/ACT nasal spray Place 2 sprays into both nostrils as needed for allergies or rhinitis (congestion).     [provider]  levothyroxine (SYNTHROID, LEVOTHROID) 125 MCG tablet Take 125 mcg by mouth daily before breakfast.     [provider]  LORazepam (ATIVAN) 0.5 MG tablet Take 1 tablet (0.5 mg total) by mouth every 6 (six) hours as needed for anxiety. 12/17/17   Mikhail, Velta Addison, DO  methocarbamol (ROBAXIN) 500 MG tablet Take 1 tablet (500 mg total) by mouth every 6 (six) hours as needed for muscle spasms. 12/17/17   Mikhail, Velta Addison, DO  midodrine (PROAMATINE) 5 MG tablet Take 1 tablet (5 mg total) by mouth 3 (three) times daily with meals. 01/18/16   Alvia Grove, PA-C  oxyCODONE-acetaminophen (PERCOCET) 10-325 MG tablet Take 1  tablet by mouth every 6 (six) hours as needed. 12/17/17   Mikhail, Velta Addison, DO  polyethylene glycol (MIRALAX / GLYCOLAX) packet Take 17 g by mouth daily as needed for mild constipation. 12/17/17   Mikhail, Velta Addison, DO  promethazine (PHENERGAN) 25 MG tablet Take 25 mg by mouth 2 (two) times daily.     [provider]  sodium polystyrene (KAYEXALATE) powder Take 15 g by mouth See admin instructions. Take 15 g (mixed in  water) by mouth on Saturdays and Sundays    [provider]  traZODone (DESYREL) 100 MG tablet Take 100 mg by mouth at bedtime.    [provider]  zolpidem (AMBIEN) 10 MG tablet Take 10 mg by mouth at bedtime.     [provider]    Physical Exam: Vitals:   12/21/17 1315 12/21/17 1330 12/21/17 1345 12/21/17 1400  BP: (!) 107/91 (!) 132/118 110/62 102/64  Pulse: 71 70 70 71  Resp: _0 (!) 21  Temp:      TempSrc:      SpO2: 96% 98% 92% 98%    Constitutional: NAD, calm, comfortable Eyes: PERRL, lids and conjunctivae normal ENMT: Mucous membranes are moist. Posterior pharynx clear of any exudate or lesions.Normal dentition.  Neck: normal, supple, no masses, no thyromegaly Respiratory: clear to auscultation bilaterally, no wheezing, no crackles. Normal respiratory effort. No accessory muscle use.  Cardiovascular: Regular rate and rhythm, 3/6 systolic murmur, no rubs / gallops. No extremity edema. 2+ pedal pulses. No carotid bruits.  Right inguinal HD catheter in place.  Multiple areas of scars AV fistulae in upper extremities.  Stage II lymphedema. Abdomen: Positive ileostomy, obese, soft, no tenderness, no masses palpated. No hepatosplenomegaly. Bowel sounds positive.  Musculoskeletal: no clubbing / cyanosis. Good ROM, no contractures. Normal muscle tone.  Skin: Hyperkeratotic changes on ankles. Neurologic: CN 2-12 grossly intact. Sensation intact, moving all extremities. Psychiatric: Normal judgment and insight. Alert and oriented x 4.  Normal mood.   Labs on Admission: I have personally reviewed following labs and imaging studies  CBC: Recent Labs  Lab 12/15/17 0519 12/17/17 0437 12/17/17 0715 12/21/17 1304  WBC  --   --  6.2 5.5  HGB 9.8* 9.4* 9.5* 9.6*  HCT 32.8* 31.0* 32.3* 32.9*  MCV  --   --  97.6 98.2  PLT  --   --  172 009*   Basic Metabolic Panel: Recent Labs  Lab 12/15/17 0519 12/17/17 0437 12/21/17 1304  NA 135 133* 134*  K 4.8 5.9* 5.7*  CL 96* 96* 95*  CO2 25 21* 23  GLUCOSE 75 91 89  BUN 43* 77* 74*  CREATININE 9.11* 13.48* 12.84*  CALCIUM 8.5* 8.6* 9.8  PHOS 5.6* 5.7*  --    GFR: Estimated Creatinine Clearance: 9 mL/min (A) (by C-G formula based on SCr of 12.84 mg/dL (H)). Liver Function Tests: Recent Labs  Lab 12/15/17 0519 12/17/17 0437  ALBUMIN 2.6* 2.5*   No results for input(s): LIPASE, AMYLASE in the last 168 hours. No results for input(s): AMMONIA in the last 168 hours. Coagulation Profile: No results for input(s): INR, PROTIME in the last 168 hours. Cardiac Enzymes: No results for input(s): CKTOTAL, CKMB, CKMBINDEX, TROPONINI in the last 168 hours. BNP (last 3 results) No results for input(s): PROBNP in the last 8760 hours. HbA1C: No results for input(s): HGBA1C in the last 72 hours. CBG: No results for input(s): GLUCAP in the last 168 hours. Lipid Profile: No results for input(s): CHOL, HDL, LDLCALC, TRIG, CHOLHDL, LDLDIRECT in the last 72 hours. Thyroid Function Tests: No results for input(s): TSH, T4TOTAL, FREET4, T3FREE, THYROIDAB in the last 72 hours. Anemia Panel: No results for input(s): VITAMINB12, FOLATE, FERRITIN, TIBC, IRON, RETICCTPCT in the last 72 hours. Urine analysis:    Component Value Date/Time   COLORURINE RED (A) 07/31/2010 0410   APPEARANCEUR TURBID (A) 07/31/2010 0410   LABSPEC 1.044 (H) 07/31/2010 0410   PHURINE 6.5 07/31/2010 0410   GLUCOSEU  100 (A) 07/31/2010 0410   HGBUR LARGE (A) 07/31/2010 0410   BILIRUBINUR LARGE (A) 07/31/2010  0410   KETONESUR 40 (A) 07/31/2010 0410   PROTEINUR >300 (A) 07/31/2010 0410   UROBILINOGEN 4.0 (H) 07/31/2010 0410   NITRITE POSITIVE (A) 07/31/2010 0410   LEUKOCYTESUR LARGE (A) 07/31/2010 0410    Radiological Exams on Admission: No results found.  EKG: Independently reviewed.   Assessment/Plan Principal Problem:   Problem with dialysis access [facility]) Observation/telemetry. The patient will be taken to the OR by Dr. Carlis Abbott to try to reestablish HD access.  In case that this is not possible, the patient will need a translumbar IVC catheter to be placed by IR.  Dr. Aletta Edouard was made aware of the case.  Will be on-call during this weekend in case that this is needed.  Active Problems:   ESRD (end stage renal disease) (HCC) Discussed with Dr. Moshe Cipro. She will have dialysis orders once HD access established and cleared by VS. Follow-up renal function electrolytes.    Anemia  Monitor hematocrit and hemoglobin. Epogen per nephrology.    Hypothyroidism Continue levothyroxine 125 mcg p.o. daily.    GERD (gastroesophageal reflux disease) Famotidine 40 mg p.o. daily.    Anxiety Continue lorazepam as needed.   Hyperkalemia Continue awaken Kayexalate. Kayexalate 30 g x 1 dose today.    DVT prophylaxis:  SCDs. Code Status: No code. Family Communication: Disposition Plan: Observation for vascular surgery evaluation of HD access. Consults called: Nephrology and vascular surgery. Admission status: Observation/telemetry.   Reubin Milan MD [facility]  If 7PM-7AM, please contact night-coverage [email or web address] Password [email or web address]  12/21/2017, 2:32 PM

## 2017-12-21 NOTE — Progress Notes (Signed)
New Admission Note:   Arrival Method: Bed  Mental Orientation: Alert and oriented x4  Telemetry: Assessment: Completed Skin: IV: left  Hand Pain: shoulder pain 9  Tubes: urostomy right lower qudrant  Safety Measures: Safety Fall Prevention Plan has been given, discussed and signed Admission: Completed [facility] [place] Orientation: Patient has been orientated to the room, unit and staff.  Family: None   Orders have been reviewed and implemented. Will continue to monitor the patient. Call light has been placed within reach and bed alarm has been activated.   Kayslee Furey RN [place] [facility] Phone: [phone]

## 2017-12-21 NOTE — Anesthesia Procedure Notes (Signed)
Procedure Name: MAC Date/Time: 12/21/2017 7:28 PM Performed by: Teressa Lower., CRNA Pre-anesthesia Checklist: Patient identified, Emergency Drugs available, Suction available, Patient being monitored and Timeout performed Patient Re-evaluated:Patient Re-evaluated prior to induction Oxygen Delivery Method: Circle system utilized Induction Type: IV induction Ventilation: Oral airway inserted - appropriate to patient size

## 2017-12-21 NOTE — Progress Notes (Signed)
[facility] Nephrology Quick Note:  Aware that Mr. Ehle is inpatient with non-functional catheter and mild hyperkalemia. At this point, we cannot dialyze until his access is sorted. Dr. Carlis Abbott ([facility]) plans to take him for attempted exchange over a wire later this evening. If unsuccessful, will need translumbar cath placed with IR.   Vitals reveiwed, afebrile.  Blood pressure 124/72, pulse 70, temperature 98.8 F (37.1 C), temperature source Oral, resp. rate 16, SpO2 99 %.  Already has orders for Kayexalate to be given tomorrow morning which is fine, added to give a dose of Lokelma this evening as well. Will plan to dialyze tomorrow morning - orders entered into the computer. Full consult will be done tomorrow morning (12/21).  [name], PA-C [employer] Pager [phone]

## 2017-12-21 NOTE — Progress Notes (Signed)
Patient states that he wants to be a FULL code. MD was notified.

## 2017-12-21 NOTE — Anesthesia Postprocedure Evaluation (Signed)
Anesthesia Post Note  Patient: [name]  Procedure(s) Performed: RIGHT FEMORAL DIALYSIS CATHETER EXCHANGE (Right )     Patient location during evaluation: PACU Anesthesia Type: MAC Level of consciousness: awake and alert Pain management: pain level controlled Vital Signs Assessment: post-procedure vital signs reviewed and stable Respiratory status: spontaneous breathing, nonlabored ventilation, respiratory function stable and patient connected to nasal cannula oxygen Cardiovascular status: stable and blood pressure returned to baseline Postop Assessment: no apparent nausea or vomiting Anesthetic complications: no    Last Vitals:  Vitals:   12/21/17 2027 12/21/17 2044  BP:  127/71  Pulse:  75  Resp:  18  Temp: 36.6 C 36.9 C  SpO2:  100%    Last Pain:  Vitals:   12/21/17 2044  TempSrc: Oral  PainSc:                  Karyl Kinnier Lundon Rosier

## 2017-12-21 NOTE — Transfer of Care (Signed)
Immediate Anesthesia Transfer of Care Note  Patient: [name]  Procedure(s) Performed: RIGHT FEMORAL DIALYSIS CATHETER EXCHANGE (Right )  Patient Location: PACU  Anesthesia Type:MAC  Level of Consciousness: awake, alert  and oriented  Airway & Oxygen Therapy: Patient Spontanous Breathing  Post-op Assessment: Report given to RN and Post -op Vital signs reviewed and stable  Post vital signs: Reviewed and stable  Last Vitals:  Vitals Value Taken Time  BP 127/79 12/21/2017  8:04 PM  Temp 36.6 C 12/21/2017  8:01 PM  Pulse 79 12/21/2017  8:06 PM  Resp 11 12/21/2017  8:06 PM  SpO2 98 % 12/21/2017  8:06 PM  Vitals shown include unvalidated device data.  Last Pain:  Vitals:   12/21/17 1514  TempSrc: Oral  PainSc:          Complications: No apparent anesthesia complications

## 2017-12-22 ENCOUNTER — Encounter (HOSPITAL_COMMUNITY): Payer: Self-pay | Admitting: *Deleted

## 2017-12-22 ENCOUNTER — Encounter: Payer: Self-pay | Admitting: Internal Medicine

## 2017-12-22 ENCOUNTER — Other Ambulatory Visit: Payer: Self-pay

## 2017-12-22 DIAGNOSIS — T8249XA Other complication of vascular dialysis catheter, initial encounter: Secondary | ICD-10-CM | POA: Diagnosis not present

## 2017-12-22 DIAGNOSIS — I739 Peripheral vascular disease, unspecified: Secondary | ICD-10-CM | POA: Insufficient documentation

## 2017-12-22 DIAGNOSIS — T82898A Other specified complication of vascular prosthetic devices, implants and grafts, initial encounter: Secondary | ICD-10-CM | POA: Diagnosis not present

## 2017-12-22 LAB — CBC WITH DIFFERENTIAL/PLATELET
Abs Immature Granulocytes: 0.02 10*3/uL (ref 0.00–0.07)
Basophils Absolute: 0 10*3/uL (ref 0.0–0.1)
Basophils Relative: 1 %
Eosinophils Absolute: 0.1 10*3/uL (ref 0.0–0.5)
Eosinophils Relative: 3 %
HCT: 32.3 % — ABNORMAL LOW (ref 39.0–52.0)
Hemoglobin: 9.5 g/dL — ABNORMAL LOW (ref 13.0–17.0)
Immature Granulocytes: 1 %
Lymphocytes Relative: 12 %
Lymphs Abs: 0.5 10*3/uL — ABNORMAL LOW (ref 0.7–4.0)
MCH: 29.2 pg (ref 26.0–34.0)
MCHC: 29.4 g/dL — ABNORMAL LOW (ref 30.0–36.0)
MCV: 99.4 fL (ref 80.0–100.0)
Monocytes Absolute: 0.5 10*3/uL (ref 0.1–1.0)
Monocytes Relative: 12 %
Neutro Abs: 3.2 10*3/uL (ref 1.7–7.7)
Neutrophils Relative %: 71 %
Platelets: 134 10*3/uL — ABNORMAL LOW (ref 150–400)
RBC: 3.25 MIL/uL — ABNORMAL LOW (ref 4.22–5.81)
RDW: 16.7 % — ABNORMAL HIGH (ref 11.5–15.5)
WBC: 4.4 10*3/uL (ref 4.0–10.5)
nRBC: 0 % (ref 0.0–0.2)

## 2017-12-22 LAB — COMPREHENSIVE METABOLIC PANEL
ALT: <5 U/L (ref 0–44)
AST: 10 U/L — ABNORMAL LOW (ref 15–41)
Albumin: 2.7 g/dL — ABNORMAL LOW (ref 3.5–5.0)
Alkaline Phosphatase: 54 U/L (ref 38–126)
Anion gap: 17 — ABNORMAL HIGH (ref 5–15)
BUN: 81 mg/dL — ABNORMAL HIGH (ref 6–20)
CO2: 21 mmol/L — ABNORMAL LOW (ref 22–32)
Calcium: 9.2 mg/dL (ref 8.9–10.3)
Chloride: 95 mmol/L — ABNORMAL LOW (ref 98–111)
Creatinine, Ser: 14.21 mg/dL — ABNORMAL HIGH (ref 0.61–1.24)
GFR calc Af Amer: 4 mL/min — ABNORMAL LOW (ref >60)
GFR calc non Af Amer: 3 mL/min — ABNORMAL LOW (ref >60)
Glucose, Bld: 78 mg/dL (ref 70–99)
Potassium: 6.2 mmol/L — ABNORMAL HIGH (ref 3.5–5.1)
Sodium: 133 mmol/L — ABNORMAL LOW (ref 135–145)
Total Bilirubin: 0.6 mg/dL (ref 0.3–1.2)
Total Protein: 8.5 g/dL — ABNORMAL HIGH (ref 6.5–8.1)

## 2017-12-22 MED ORDER — ALTEPLASE 2 MG IJ SOLR
INTRAMUSCULAR | Status: AC
  Filled 2017-12-22: qty 6

## 2017-12-22 MED ORDER — ALTEPLASE 2 MG IJ SOLR
2.0000 mg | Freq: Once | INTRAMUSCULAR | Status: DC | PRN
  Filled 2017-12-22: qty 2

## 2017-12-22 MED ORDER — HEPARIN SODIUM (PORCINE) 1000 UNIT/ML DIALYSIS: 1000.0000 [IU] | INTRAMUSCULAR | Status: DC | PRN

## 2017-12-22 MED ORDER — FENTANYL CITRATE (PF) 100 MCG/2ML IJ SOLN
50.0000 ug | INTRAMUSCULAR | Status: AC | PRN
  Administered 2017-12-22 – 2017-12-23 (×4): 50 ug via INTRAVENOUS
  Filled 2017-12-22 (×2): qty 2

## 2017-12-22 MED ORDER — CEFAZOLIN SODIUM-DEXTROSE 2-4 GM/100ML-% IV SOLN
2.0000 g | Freq: Once | INTRAVENOUS | Status: AC
  Administered 2017-12-22: 2 g via INTRAVENOUS
  Filled 2017-12-22: qty 100

## 2017-12-22 MED ORDER — FENTANYL CITRATE (PF) 100 MCG/2ML IJ SOLN
INTRAMUSCULAR | Status: AC
  Filled 2017-12-22: qty 2

## 2017-12-22 MED ORDER — SODIUM CHLORIDE 0.9 % IV SOLN: 100.0000 mL | INTRAVENOUS | Status: DC | PRN

## 2017-12-22 MED ORDER — LORAZEPAM 0.5 MG PO TABS: 0.5000 mg | ORAL_TABLET | Freq: Four times a day (QID) | ORAL | 0 refills | Status: DC | PRN

## 2017-12-22 MED ORDER — HEPARIN SODIUM (PORCINE) 1000 UNIT/ML IJ SOLN
INTRAMUSCULAR | Status: AC
  Filled 2017-12-22: qty 6

## 2017-12-22 MED ORDER — SODIUM POLYSTYRENE SULFONATE 15 GM/60ML PO SUSP: 15.0000 g | ORAL | Status: DC

## 2017-12-22 MED ORDER — OXYCODONE-ACETAMINOPHEN 10-325 MG PO TABS: 1.0000 | ORAL_TABLET | Freq: Four times a day (QID) | ORAL | 0 refills | Status: DC | PRN

## 2017-12-22 NOTE — Progress Notes (Signed)
[facility]  Subjective  - POD#1 s/p exchange right femoral collateral tunneled dialysis catheter.     Objective 111/73 70 98.4 F (36.9 C) (Oral) 18 98%  Intake/Output Summary (Last 24 hours) at 12/22/2017 0819 Last data filed at 12/22/2017 0600 Gross per 24 hour  Intake 740 ml  Output 30 ml  Net 710 ml    Right femoral tunneled dialysis catheter  Laboratory Lab Results: Recent Labs    12/21/17 1304 12/22/17 0526  WBC 5.5 4.4  HGB 9.6* 9.5*  HCT 32.9* 32.3*  PLT 146* 134*   BMET Recent Labs    12/21/17 1304 12/22/17 0526  NA 134* 133*  K 5.7* 6.2*  CL 95* 95*  CO2 23 21*  GLUCOSE 89 78  BUN 74* 81*  CREATININE 12.84* 14.21*  CALCIUM 9.8 9.2    COAG Lab Results  Component Value Date   INR 1.14 12/13/2017   INR 1.15 12/07/2017   INR 1.05 01/14/2016   No results found for: PTT  Assessment/Planning: Catheter exchanged in OR last night.  Will need to monitor if it works today.  Discussed if this fails will need translumbar IVC with IR.  No other vascular options for access from our standpoint.    Marty Heck 12/22/2017 8:19 AM --

## 2017-12-22 NOTE — Procedures (Signed)
   I was present at this dialysis session, have reviewed the session itself and made  appropriate changes [name] MD [place] pager [phone]   12/22/2017, 1:29 PM

## 2017-12-22 NOTE — Progress Notes (Signed)
[facility] Nephrology Quick Note:  Update on HD today - TDC running not perfect, but decent. BFR 250-300. Likely has some swelling as well after difficult placement, so potentially BFR could improve with next treatment. Discussed his case with his usual outpatient nephrologist (Dr. Moshe Cipro) who feels that it is running decent enough for now. Our team understands that there are no further vascular surgery options and that will need IR to place translumbar catheter if this one fails. Dr. Moshe Cipro will follow his dialysis clearance as outpatient  (^ time if needed) and will work to get him on a daily potassium binder to prevent recurrent hyperkalemia.  Therefore, ok from renal standpoint to discharge after dialysis today.  [name], PA-C [employer] Pager [phone]

## 2017-12-22 NOTE — Discharge Summary (Addendum)
Physician Discharge Summary  Beth Spackman DXA:[id number] DOB: [birth year] DOA: 12/21/2017  PCP: [name], MD  Admit date: 12/21/2017 Discharge date: 12/23/2017  Admitted From: SNF Disposition:  SNF   Discharge Condition: Stable CODE STATUS: Full  Diet recommendation: Renal   Brief/Interim Summary: [name] is a 52 y.o. male with medical history significant of anemia, anxiety, constipation, ESRD on MWF dialysis, GERD, history of grand mal seizures (last seen 63 IDA), urolithiasis, hypertension, hypothyroidism, insomnia, history of remote migraine headaches, history of pneumonia x2, history of spina bifida who was transferred from the dialysis center after his session has to be stopped due to malfunctioning dialysis abscess.  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  He was evaluated by vascular surgery, underwent exchange of tunneled right femoral collateral dialysis catheter 12/20.  If this fails, patient will need translumbar IVC with IR.  He subsequently underwent dialysis on 12/21 and 12/22, plan for follow-up with outpatient nephrology.    Discharge Diagnoses:  Principal Problem:   Problem with dialysis access [facility]) Active Problems:   ESRD (end stage renal disease) ([place])   Anemia   Hypothyroidism   GERD (gastroesophageal reflux disease)   Anxiety   Discharge Instructions  Discharge Instructions    Call MD for:  difficulty breathing, headache or visual disturbances   Complete by:  As directed    Call MD for:  extreme fatigue   Complete by:  As directed    Call MD for:  persistant dizziness or light-headedness   Complete by:  As directed    Call MD for:  persistant nausea and vomiting   Complete by:  As directed    Call MD for:  severe uncontrolled pain   Complete by:  As directed    Call MD for:  temperature >100.4   Complete by:  As directed    Increase activity slowly   Complete by:  As directed      Allergies as of 12/23/2017       Reactions   Furadantin [nitrofurantoin] Other (See Comments)   UNSPECIFIED REACTION    Mandelamine [methenamine] Other (See Comments)   UNSPECIFIED REACTION    Noroxin [norfloxacin] Other (See Comments)   UNSPECIFIED REACTION    Carmine Nausea Only   Contrast Media [iodinated Diagnostic Agents] Nausea And Vomiting   Oral dye causes vomiting, IV dye is okay   Hydrocodone Other (See Comments)   Caused involuntary movement and twitching. CAN NOT TAKE DUE TO MUSCLE SPASMS AND MUSCLE TREMORS   Metrizamide Nausea And Vomiting   Oral dye causes vomiting, IV dye is okay   Sulfa Antibiotics Cough   Childhood reaction - pt could not confirm that it was a cough   Sulfur Cough   Childhood reaction - pt could not confirm that it was a cough      Medication List    STOP taking these medications   promethazine 25 MG tablet Commonly known as:  PHENERGAN     TAKE these medications   calcium acetate 667 MG capsule Commonly known as:  PHOSLO Take 2,001-2,708 mg by mouth See admin instructions. Take 2708 mg by mouth 3 times daily with meals, take 2001 mg by mouth with snacks   ceFAZolin  IVPB Commonly known as:  ANCEF Inject 2 g into the vein every Monday, Wednesday, and Friday with hemodialysis. Indication:  MSSA Bacteremia Last Day of Therapy:  01/19/2018 Labs - Once weekly:  CBC/D and BMP, Labs - Every other week:  ESR  and CRP   clopidogrel 75 MG tablet Commonly known as:  PLAVIX Take 1 tablet (75 mg total) by mouth daily.   cyclobenzaprine 5 MG tablet Commonly known as:  FLEXERIL Take 1 tablet (5 mg total) by mouth 3 (three) times daily as needed for muscle spasms (shoulder/neck tightness).   famotidine 40 MG tablet Commonly known as:  PEPCID Take 40 mg by mouth daily at 2 PM.   fluticasone 50 MCG/ACT nasal spray Commonly known as:  FLONASE Place 2 sprays into both nostrils as needed for allergies or rhinitis (congestion).   levothyroxine 125 MCG tablet Commonly known as:   SYNTHROID, LEVOTHROID Take 125 mcg by mouth daily before breakfast.   LORazepam 0.5 MG tablet Commonly known as:  ATIVAN Take 1 tablet (0.5 mg total) by mouth every 6 (six) hours as needed for anxiety.   methocarbamol 500 MG tablet Commonly known as:  ROBAXIN Take 1 tablet (500 mg total) by mouth every 6 (six) hours as needed for muscle spasms.   midodrine 5 MG tablet Commonly known as:  PROAMATINE Take 1 tablet (5 mg total) by mouth 3 (three) times daily with meals. What changed:    when to take this  reasons to take this   NEPRO PO Take 1 Can by mouth daily.   oxyCODONE-acetaminophen 10-325 MG tablet Commonly known as:  PERCOCET Take 1 tablet by mouth every 6 (six) hours as needed for pain. What changed:  reasons to take this   polyethylene glycol packet Commonly known as:  MIRALAX / GLYCOLAX Take 17 g by mouth daily as needed for mild constipation.   sodium polystyrene powder Commonly known as:  KAYEXALATE Take 15 g by mouth See admin instructions. Take 15 g (mixed in water) by mouth on Saturdays and Sundays   traZODone 100 MG tablet Commonly known as:  DESYREL Take 100 mg by mouth at bedtime.   zolpidem 10 MG tablet Commonly known as:  AMBIEN Take 10 mg by mouth at bedtime.      Follow-up Information    [name], MD.   Specialty:  Family Medicine Contact information: [address] [place] [address] [phone]          Allergies  Allergen Reactions  . Furadantin [Nitrofurantoin] Other (See Comments)    UNSPECIFIED REACTION   . Mandelamine [Methenamine] Other (See Comments)    UNSPECIFIED REACTION   . Noroxin [Norfloxacin] Other (See Comments)    UNSPECIFIED REACTION   . Carmine Nausea Only  . Contrast Media [Iodinated Diagnostic Agents] Nausea And Vomiting    Oral dye causes vomiting, IV dye is okay  . Hydrocodone Other (See Comments)    Caused involuntary movement and twitching. CAN NOT TAKE DUE TO MUSCLE SPASMS  AND MUSCLE TREMORS  . Metrizamide Nausea And Vomiting    Oral dye causes vomiting, IV dye is okay  . Sulfa Antibiotics Cough    Childhood reaction - pt could not confirm that it was a cough  . Sulfur Cough    Childhood reaction - pt could not confirm that it was a cough    Consultations:  Vascular surgery  Nephrology   Discharge Exam: Vitals:   12/23/17 0930 12/23/17 1000  BP: (!) 88/39 (!) 89/45  Pulse: 73 73  Resp:    Temp:    SpO2:      General: Pt is alert, awake, not in acute distress Cardiovascular: RRR, S1/S2 +, no rubs, no gallops Respiratory: CTA bilaterally, no wheezing, no  rhonchi Abdominal: Soft, NT, ND, bowel sounds + Extremities: no edema, no cyanosis    The results of significant diagnostics from this hospitalization (including imaging, microbiology, ancillary and laboratory) are listed below for reference.     Microbiology: No results found for this or any previous visit (from the past 240 hour(s)).   Labs: BNP (last 3 results) No results for input(s): BNP in the last 8760 hours. Basic Metabolic Panel: Recent Labs  Lab 12/17/17 0437 12/21/17 1304 12/22/17 0526 12/23/17 0419  NA 133* 134* 133* 132*  K 5.9* 5.7* 6.2* 5.9*  CL 96* 95* 95* 96*  CO2 21* 23 21* 21*  GLUCOSE 91 89 78 71  BUN 77* 74* 81* 65*  CREATININE 13.48* 12.84* 14.21* 12.43*  CALCIUM 8.6* 9.8 9.2 9.1  PHOS 5.7*  --   --   --    Liver Function Tests: Recent Labs  Lab 12/17/17 0437 12/22/17 0526  AST  --  10*  ALT  --  <5  ALKPHOS  --  54  BILITOT  --  0.6  PROT  --  8.5*  ALBUMIN 2.5* 2.7*   No results for input(s): LIPASE, AMYLASE in the last 168 hours. No results for input(s): AMMONIA in the last 168 hours. CBC: Recent Labs  Lab 12/17/17 0437 12/17/17 0715 12/21/17 1304 12/22/17 0526 12/23/17 0653  WBC  --  6.2 5.5 4.4 4.4  NEUTROABS  --   --   --  3.2  --   HGB 9.4* 9.5* 9.6* 9.5* 9.1*  HCT 31.0* 32.3* 32.9* 32.3* 30.5*  MCV  --  97.6 98.2 99.4 99.0   PLT  --  172 146* 134* 126*   Cardiac Enzymes: No results for input(s): CKTOTAL, CKMB, CKMBINDEX, TROPONINI in the last 168 hours. BNP: Invalid input(s): POCBNP CBG: Recent Labs  Lab 12/21/17 1520  GLUCAP 89   D-Dimer No results for input(s): DDIMER in the last 72 hours. Hgb A1c No results for input(s): HGBA1C in the last 72 hours. Lipid Profile No results for input(s): CHOL, HDL, LDLCALC, TRIG, CHOLHDL, LDLDIRECT in the last 72 hours. Thyroid function studies No results for input(s): TSH, T4TOTAL, T3FREE, THYROIDAB in the last 72 hours.  Invalid input(s): FREET3 Anemia work up No results for input(s): VITAMINB12, FOLATE, FERRITIN, TIBC, IRON, RETICCTPCT in the last 72 hours. Urinalysis    Component Value Date/Time   COLORURINE RED (A) 07/31/2010 0410   APPEARANCEUR TURBID (A) 07/31/2010 0410   LABSPEC 1.044 (H) 07/31/2010 0410   PHURINE 6.5 07/31/2010 0410   GLUCOSEU 100 (A) 07/31/2010 0410   HGBUR LARGE (A) 07/31/2010 0410   BILIRUBINUR LARGE (A) 07/31/2010 0410   KETONESUR 40 (A) 07/31/2010 0410   PROTEINUR >300 (A) 07/31/2010 0410   UROBILINOGEN 4.0 (H) 07/31/2010 0410   NITRITE POSITIVE (A) 07/31/2010 0410   LEUKOCYTESUR LARGE (A) 07/31/2010 0410   Sepsis Labs Invalid input(s): PROCALCITONIN,  WBC,  LACTICIDVEN Microbiology No results found for this or any previous visit (from the past 240 hour(s)).   Patient was seen and examined on the day of discharge and was found to be in stable condition. Time coordinating discharge: 40 minutes including assessment and coordination of care, as well as examination of the patient.   SIGNED:  Dessa Phi, DO [facility] Pager [phone]  If 7PM-7AM, please contact night-coverage [email or web address] Password [email or web address] 12/23/2017, 10:38 AM

## 2017-12-22 NOTE — Progress Notes (Addendum)
Informed by SW, SNF cannot take patient today but can take tomorrow. Spoke with HD team, plan for HD (holiday schedule) and discharge to [facility] 12/22.   Dessa Phi, DO [facility] [email or web address] 12/22/2017, 3:26 PM

## 2017-12-22 NOTE — Consult Note (Addendum)
[facility] Renal Consultation Note    Indication for Consultation:  Management of ESRD/hemodialysis, anemia, hypertension/volume, and secondary hyperparathyroidism. PCP:  HPI: [name] is a 52 y.o. male with ESRD, spina bifida, Hx seizure disorder, HTN, hypothyroidism, and severe vascular disease who was admitted OBS status for non-functional HD catheter and hyperkalemia.   Presented to ED 12/20 as directed by VVS office for catheter exchange. Last full dialysis 12/18. Taken to OR with successful exchange of femoral catheter by Dr. Carlis Abbott on the evening of 12/20. Hyperkalemia noted on labs, was given 1 dose of Lokelma, but will need to be dialyzed this morning for correction of hyperK as well as to assure that his new [facility] is working. Pt aware that if catheter does not work that he will need translumbar catheter placed (last HD access option).  This morning, only complaint is severe neck/upper back pain, did not sleep well overnight. No CP/dyspnea.  Past Medical History:  Diagnosis Date  . Anemia   . Anxiety   . Constipation   . End stage renal failure on dialysis [facility])    "[place]; MWF" (02/29/2016)  . GERD (gastroesophageal reflux disease)   . Grand mal seizure ([place]) X 4   last 1998 (02/29/2016)  . Heart murmur    no problems with per pt. - nephrologist and Dr. Coletta Memos  . History of blood transfusion 2000s   "I was having blood loss; never found out from where"  . History of kidney stones   . Hypertension    hx of - has not taken bp meds in over 2 years  . Hypothyroidism   . Insomnia   . Migraine    "due to my BP in my late 1990s; none since" (02/29/2016)  . Nonhealing surgical wound    of the left arteriovenous graft  . Pneumonia    x 2  . Shortness of breath    rarely  . Spina bifida [facility])    Past Surgical History:  Procedure Laterality Date  . AMPUTATION Left 12/11/2017   Procedure: Left Great Toe Amputation;  Surgeon: [name], MD;   Location: [place];  Service: Orthopedics;  Laterality: Left;  . APPENDECTOMY    . ARTERIOVENOUS GRAFT PLACEMENT Left 10/16/2012   left femoral goretex graft         Dr Donnetta Hutching  . AV FISTULA PLACEMENT Left 06/27/2012   Procedure: EXPLORATORY LEFT THY-GRAFT PSEUDO-ANEURYSM;  Surgeon: Conrad San Isidro, MD;  Location: [place];  Service: Vascular;  Laterality: Left;  Revision of left Arteriovenus gortex graft in thigh.  . COLONOSCOPY    . I&D EXTREMITY Left 02/29/2016   Procedure: DEBRIDEMENT LEFT THIGH WOUND;  Surgeon: Waynetta Sandy, MD;  Location: [place];  Service: Vascular;  Laterality: Left;  . ILEOSTOMY  1970s  . INCISION AND DRAINAGE Left 10/16/2012   Procedure: INCISION AND Debridement left thigh graft;  Surgeon: Rosetta Posner, MD;  Location: [place];  Service: Vascular;  Laterality: Left;  . INSERTION OF DIALYSIS CATHETER    . INSERTION OF DIALYSIS CATHETER  01/14/2016   Procedure: INSERTION OF DIALYSIS CATHETER;  Surgeon: Waynetta Sandy, MD;  Location: [place];  Service: Vascular;;  . INSERTION OF DIALYSIS CATHETER Right 08/07/2017   Procedure: INSERTION OF DIALYSIS CATHETER;  Surgeon: Waynetta Sandy, MD;  Location: [place];  Service: Vascular;  Laterality: Right;  . INSERTION OF DIALYSIS CATHETER Right 12/21/2017   Procedure: RIGHT FEMORAL DIALYSIS CATHETER EXCHANGE;  Surgeon: Marty Heck,  MD;  Location: [place];  Service: Vascular;  Laterality: Right;  . INSERTION OF ILIAC STENT Left 01/14/2016   Procedure: INSERTION OF ILIAC STENT;  Surgeon: Waynetta Sandy, MD;  Location: [place];  Service: Vascular;  Laterality: Left;  . INTRAOPERATIVE ARTERIOGRAM Left 01/14/2016   Procedure: INTRA OPERATIVE ARTERIOGRAM;  Surgeon: Waynetta Sandy, MD;  Location: [place];  Service: Vascular;  Laterality: Left;  . LITHOTRIPSY  x 3   "& a laser treatment" (02/29/2016)  . NEPHROSTOMY Bilateral 1968  . PATELLAR TENDON REPAIR Right 1990s   "big incision"  . PERIPHERAL  VASCULAR CATHETERIZATION  01/14/2016   Procedure: A/V SHUNTOGRAM;  Surgeon: Waynetta Sandy, MD;  Location: [place];  Service: Vascular;;  . REVISION OF ARTERIOVENOUS GORETEX GRAFT Left 10/16/2012   Procedure: REVISION OF LEFT FEMORAL LOOP ARTERIOVENOUS GORETEX GRAFT;  Surgeon: Rosetta Posner, MD;  Location: [place];  Service: Vascular;  Laterality: Left;  . REVISION OF ARTERIOVENOUS GORETEX GRAFT Left 02/11/2015   Procedure: EXCISION OF SMALL SEGMENT OF EXPOSED LEFT THIGH NON FUNCTIONING  ARTERIOVENOUS GORETEX GRAFT;  Surgeon: Mal Misty, MD;  Location: [place];  Service: Vascular;  Laterality: Left;  . REVISION OF ARTERIOVENOUS GORETEX GRAFT Left 01/14/2016   Procedure: REVISION OF ARTERIOVENOUS GORETEX GRAFT;  Surgeon: Waynetta Sandy, MD;  Location: [place];  Service: Vascular;  Laterality: Left;  . REVISION OF ARTERIOVENOUS GORETEX GRAFT Left 02/29/2016   thigh/pt report  . REVISION OF ARTERIOVENOUS GORETEX GRAFT Left 02/29/2016   Procedure: POSSIBLE REVISION OF LEFT THIGH ARTERIOVENOUS GORETEX GRAFT;  Surgeon: Waynetta Sandy, MD;  Location: [place];  Service: Vascular;  Laterality: Left;  . TEE WITHOUT CARDIOVERSION N/A 12/14/2017   Procedure: TRANSESOPHAGEAL ECHOCARDIOGRAM (TEE);  Surgeon: Acie Fredrickson Wonda Cheng, MD;  Location: [facility] ENDOSCOPY;  Service: Cardiovascular;  Laterality: N/A;  . THROMBECTOMY W/ EMBOLECTOMY Left 01/14/2016   Procedure: THROMBECTOMY ARTERIOVENOUS GORE-TEX Left thigh GRAFT;  Surgeon: Waynetta Sandy, MD;  Location: [place];  Service: Vascular;  Laterality: Left;  . TONGUE SURGERY  ~ 1990   tongue-tie release   . ULTRASOUND GUIDANCE FOR VASCULAR ACCESS Right 08/07/2017   Procedure: ULTRASOUND GUIDANCE FOR VASCULAR CANNULATION RIGHT FEMORAL VEIN AND LEFT AV FEMORAL GRAFT;  Surgeon: Waynetta Sandy, MD;  Location: [place];  Service: Vascular;  Laterality: Right;  . UPPER EXTREMITY VENOGRAPHY Bilateral 08/09/2017   Procedure: UPPER EXTREMITY  VENOGRAPHY;  Surgeon: Serafina [place], MD;  Location: [place] CV LAB;  Service: Cardiovascular;  Laterality: Bilateral;  . VENOGRAM N/A 09/20/2017   Procedure: RIGHT COMMON FEMORAL ARTERY EXPLORATION.  CANNULATION RIGHT COMMON FEMORAL VEIN. VENOGRAM CENTRAL ULTRA SOUND GUIDED RIGHT FEMORAL VEIN TIMES TWO.;  Surgeon: Waynetta Sandy, MD;  Location[facility] OR;  Service: Vascular;  Laterality: N/A;  . WOUND DEBRIDEMENT Left 02/29/2016   thigh   Family History  Problem Relation Age of Onset  . Diabetes Mother   . Hypertension Mother   . Heart disease Mother        before age 19  . Diabetes Father   . Heart attack Father        X's 3  . Diabetes Sister   . Bipolar disorder Sister    Social History:  reports that he has never smoked. He has never used smokeless tobacco. He reports previous alcohol use. He reports previous drug use. Drug: Marijuana.  ROS: As per HPI otherwise negative.  Physical Exam: Vitals:   12/21/17 2027 12/21/17 2044 12/22/17 0400 12/22/17 1001  BP:  127/71 111/73 116/73  Pulse:  75 70 73  Resp:  '18 18 18  ' Temp: 97.9 F (36.6 C) 98.5 F (36.9 C) 98.4 F (36.9 C) 98 F (36.7 C)  TempSrc:  Oral Oral Oral  SpO2:  100% 98% 97%  Weight:   127.4 kg   Height:   '5\' 11"'  (1.803 m)      General: Well developed, well nourished, in no acute distress. Head: Normocephalic, atraumatic, sclera non-icteric, mucus membranes are moist. Neck: Supple without lymphadenopathy/masses. JVD not elevated. Lungs: Clear bilaterally to auscultation without wheezes, rales, or rhonchi. Breathing is unlabored. Heart: RRR , 3/6 systolic murmur Abdomen: Soft, non-tender, non-distended with normoactive bowel sounds.  Musculoskeletal:  Strength and tone appear less than normal for age. Lower extremities: No edema or ischemic changes, no open wounds. Neuro: Alert and oriented X 3. Moves all extremities spontaneously. Psych:  Responds to questions appropriately with a normal  affect. Dialysis Access: [facility] R femoral (new)  Allergies  Allergen Reactions  . Furadantin [Nitrofurantoin] Other (See Comments)    UNSPECIFIED REACTION   . Mandelamine [Methenamine] Other (See Comments)    UNSPECIFIED REACTION   . Noroxin [Norfloxacin] Other (See Comments)    UNSPECIFIED REACTION   . Carmine Nausea Only  . Contrast Media [Iodinated Diagnostic Agents] Nausea And Vomiting    Oral dye causes vomiting, IV dye is okay  . Hydrocodone Other (See Comments)    Caused involuntary movement and twitching. CAN NOT TAKE DUE TO MUSCLE SPASMS AND MUSCLE TREMORS  . Metrizamide Nausea And Vomiting    Oral dye causes vomiting, IV dye is okay  . Sulfa Antibiotics Cough    Childhood reaction - pt could not confirm that it was a cough  . Sulfur Cough    Childhood reaction - pt could not confirm that it was a cough   Prior to Admission medications   Medication Sig Start Date End Date Taking? Authorizing Provider  calcium acetate (PHOSLO) 667 MG capsule Take 2,001-2,708 mg by mouth See admin instructions. Take 2708 mg by mouth 3 times daily with meals, take 2001 mg by mouth with snacks   Yes [provider]  clopidogrel (PLAVIX) 75 MG tablet Take 1 tablet (75 mg total) by mouth daily. 01/17/16  Yes Alvia Grove, PA-C  famotidine (PEPCID) 40 MG tablet Take 40 mg by mouth daily at 2 PM.    Yes [provider]  fluticasone (FLONASE) 50 MCG/ACT nasal spray Place 2 sprays into both nostrils as needed for allergies or rhinitis (congestion).    Yes [provider]  levothyroxine (SYNTHROID, LEVOTHROID) 125 MCG tablet Take 125 mcg by mouth daily before breakfast.    Yes [provider]  LORazepam (ATIVAN) 0.5 MG tablet Take 1 tablet (0.5 mg total) by mouth every 6 (six) hours as needed for anxiety. 12/17/17  Yes Mikhail, Velta Addison, DO  methocarbamol (ROBAXIN) 500 MG tablet Take 1 tablet (500 mg total) by mouth every 6 (six) hours as needed for muscle spasms.  12/17/17  Yes Mikhail, Maryann, DO  midodrine (PROAMATINE) 5 MG tablet Take 1 tablet (5 mg total) by mouth 3 (three) times daily with meals. Patient taking differently: Take 5 mg by mouth 3 (three) times daily as needed (only with low blood pressure).  01/18/16  Yes Virgina Jock A, PA-C  Nutritional Supplements (NEPRO PO) Take 1 Can by mouth daily.   Yes [provider]  oxyCODONE-acetaminophen (PERCOCET) 10-325 MG tablet Take 1 tablet by mouth every  6 (six) hours as needed. Patient taking differently: Take 1 tablet by mouth every 6 (six) hours as needed for pain. For 2 weeks 12/17/17  Yes Mikhail, [place], DO  polyethylene glycol (MIRALAX / GLYCOLAX) packet Take 17 g by mouth daily as needed for mild constipation. 12/17/17  Yes Mikhail, Velta Addison, DO  promethazine (PHENERGAN) 25 MG tablet Take 25 mg by mouth 2 (two) times daily.    Yes [provider]  sodium polystyrene (KAYEXALATE) powder Take 15 g by mouth See admin instructions. Take 15 g (mixed in water) by mouth on Saturdays and Sundays   Yes [provider]  traZODone (DESYREL) 100 MG tablet Take 100 mg by mouth at bedtime.   Yes [provider]  zolpidem (AMBIEN) 10 MG tablet Take 10 mg by mouth at bedtime.    Yes [provider]  ceFAZolin (ANCEF) IVPB Inject 2 g into the vein every Monday, Wednesday, and Friday with hemodialysis. Indication:  MSSA Bacteremia Last Day of Therapy:  01/19/2018 Labs - Once weekly:  CBC/D and BMP, Labs - Every other week:  ESR and CRP 12/17/17 01/22/18  Cristal Ford, DO   Current Facility-Administered Medications  Medication Dose Route Frequency Provider Last Rate Last Dose  . 0.9 %  sodium chloride infusion   Intravenous Continuous Belinda Block, MD   Stopped at 12/21/17 1950  . acetaminophen (TYLENOL) tablet 650 mg  650 mg Oral Q6H PRN Reubin Milan, MD       Or  . acetaminophen (TYLENOL) suppository 650 mg  650 mg Rectal Q6H PRN Reubin Milan,  MD      . calcium acetate [facility]) capsule 2,668 mg  2,668 mg Oral TID WC Reubin Milan, MD   2,668 mg at 12/22/17 0955  . [START ON 12/24/2017] ceFAZolin (ANCEF) IVPB 2g/100 mL premix  2 g Intravenous Q M,W,F-1800 Reubin Milan, MD   2 g at 12/21/17 1917  . Chlorhexidine Gluconate Cloth 2 % PADS 6 each  6 each Topical Q0600 Loren Racer, PA-C   6 each at 12/22/17 8563  . clopidogrel (PLAVIX) tablet 75 mg  75 mg Oral Daily Reubin Milan, MD      . famotidine (PEPCID) tablet 40 mg  40 mg Oral Q1400 Reubin Milan, MD      . fentaNYL (SUBLIMAZE) injection 50 mcg  50 mcg Intravenous Q4H PRN Dessa Phi, DO   50 mcg at 12/22/17 1007  . fluticasone (FLONASE) 50 MCG/ACT nasal spray 2 spray  2 spray Each Nare PRN Reubin Milan, MD      . levothyroxine Wilmer Floor, LEVOTHROID) tablet 125 mcg  125 mcg Oral Q0600 Reubin Milan, MD   125 mcg at 12/22/17 [phone]  . LORazepam (ATIVAN) tablet 0.5 mg  0.5 mg Oral Q6H PRN Reubin Milan, MD   0.5 mg at 12/22/17 0263  . midodrine (PROAMATINE) tablet 5 mg  5 mg Oral TID WC Reubin Milan, MD   5 mg at 12/22/17 0954  . ondansetron (ZOFRAN) tablet 4 mg  4 mg Oral Q6H PRN Reubin Milan, MD       Or  . ondansetron [facility]) injection 4 mg  4 mg Intravenous Q6H PRN Reubin Milan, MD      . oxyCODONE (Oxy IR/ROXICODONE) immediate release tablet 10 mg  10 mg Oral Q4H PRN Reubin Milan, MD   10 mg at 12/22/17 7858  . polyethylene glycol (MIRALAX / GLYCOLAX) packet 17 g  17  g Oral Daily PRN Reubin Milan, MD      . promethazine [facility]) tablet 25 mg  25 mg Oral BID Reubin Milan, MD   25 mg at 12/22/17 0954  . sodium polystyrene (KAYEXALATE) 15 GM/60ML suspension 15 g  15 g Oral Once per day on Sun Sat SWVT, Carole N, DO      . sodium polystyrene (KAYEXALATE) 15 GM/60ML suspension 30 g  30 g Oral Once Reubin Milan, MD      . traZODone (DESYREL) tablet 100 mg  100 mg Oral QHS Reubin Milan, MD   100 mg at 12/21/17 2120  . zolpidem (AMBIEN) tablet 10 mg  10 mg Oral QHS Reubin Milan, MD   10 mg at 12/21/17 2120   Labs: Basic Metabolic Panel: Recent Labs  Lab 12/17/17 0437 12/21/17 1304 12/22/17 0526  NA 133* 134* 133*  K 5.9* 5.7* 6.2*  CL 96* 95* 95*  CO2 21* 23 21*  GLUCOSE 91 89 78  BUN 77* 74* 81*  CREATININE 13.48* 12.84* 14.21*  CALCIUM 8.6* 9.8 9.2  PHOS 5.7*  --   --    Liver Function Tests: Recent Labs  Lab 12/17/17 0437 12/22/17 0526  AST  --  10*  ALT  --  <5  ALKPHOS  --  54  BILITOT  --  0.6  PROT  --  8.5*  ALBUMIN 2.5* 2.7*   CBC: Recent Labs  Lab 12/17/17 0715 12/21/17 1304 12/22/17 0526  WBC 6.2 5.5 4.4  NEUTROABS  --   --  3.2  HGB 9.5* 9.6* 9.5*  HCT 32.3* 32.9* 32.3*  MCV 97.6 98.2 99.4  PLT 172 146* 134*    Dialysis Orders:  MWF -AF 4.5h 450/800 122.5kg 2/2.25 bath R thigh TDC Hep 3700  -Parsabiv 31m qHD -Hectorol659m IV qHD  Assessment/Plan: 1.  Dysfunctional HD catheter: S/p exchange over wire 12/20 - will make sure works today and then hopefully can be d/c home afterwards. 2.  Hyperkalemia: S/p Lokelma x 1 dose, for HD today to correct. 3.  ESRD: Usual MWF schedule. For HD today d/t missed HD yesterday and ^KRaliegh Ipnext "scheduled" HD tomorrow d/t holiday schedule (SuTuF) - hopefully as outpatient. 4.  Hypotension/volume: Chronically low, on midodrine. 5.  Anemia: Hgb 9.5, monitor and adjust outpt ESA dose if needed. 6.  Metabolic bone disease: Ca ok, follow. 7.  Hx spina bifida/wheelchair bound   KaVeneta PentonPA-C 12/22/2017, 10:16 AM  [facility] Pager: ([phone] seen, examined and agree w A/P as above. Difficult case of near end-stage access. Plan as above.  RoKelly SplinterD CaNewell Rubbermaidager [phone] 12/22/2017, 1:28 PM

## 2017-12-23 DIAGNOSIS — T8249XA Other complication of vascular dialysis catheter, initial encounter: Secondary | ICD-10-CM | POA: Diagnosis not present

## 2017-12-23 LAB — BASIC METABOLIC PANEL
Anion gap: 15 (ref 5–15)
BUN: 65 mg/dL — ABNORMAL HIGH (ref 6–20)
CO2: 21 mmol/L — ABNORMAL LOW (ref 22–32)
Calcium: 9.1 mg/dL (ref 8.9–10.3)
Chloride: 96 mmol/L — ABNORMAL LOW (ref 98–111)
Creatinine, Ser: 12.43 mg/dL — ABNORMAL HIGH (ref 0.61–1.24)
GFR calc Af Amer: 5 mL/min — ABNORMAL LOW (ref >60)
GFR calc non Af Amer: 4 mL/min — ABNORMAL LOW (ref >60)
Glucose, Bld: 71 mg/dL (ref 70–99)
Potassium: 5.9 mmol/L — ABNORMAL HIGH (ref 3.5–5.1)
Sodium: 132 mmol/L — ABNORMAL LOW (ref 135–145)

## 2017-12-23 LAB — CBC
HCT: 30.5 % — ABNORMAL LOW (ref 39.0–52.0)
Hemoglobin: 9.1 g/dL — ABNORMAL LOW (ref 13.0–17.0)
MCH: 29.5 pg (ref 26.0–34.0)
MCHC: 29.8 g/dL — ABNORMAL LOW (ref 30.0–36.0)
MCV: 99 fL (ref 80.0–100.0)
Platelets: 126 10*3/uL — ABNORMAL LOW (ref 150–400)
RBC: 3.08 MIL/uL — ABNORMAL LOW (ref 4.22–5.81)
RDW: 16.7 % — ABNORMAL HIGH (ref 11.5–15.5)
WBC: 4.4 10*3/uL (ref 4.0–10.5)
nRBC: 0 % (ref 0.0–0.2)

## 2017-12-23 MED ORDER — HEPARIN SODIUM (PORCINE) 1000 UNIT/ML DIALYSIS
20.0000 [IU]/kg | INTRAMUSCULAR | Status: DC | PRN
  Administered 2017-12-23: 2500 [IU] via INTRAVENOUS_CENTRAL

## 2017-12-23 MED ORDER — CYCLOBENZAPRINE HCL 5 MG PO TABS
5.0000 mg | ORAL_TABLET | Freq: Three times a day (TID) | ORAL | Status: DC | PRN
  Administered 2017-12-23: 5 mg via ORAL
  Filled 2017-12-23: qty 1

## 2017-12-23 MED ORDER — LORAZEPAM 2 MG/ML IJ SOLN
1.0000 mg | Freq: Once | INTRAMUSCULAR | Status: AC
  Administered 2017-12-23: 1 mg via INTRAVENOUS

## 2017-12-23 MED ORDER — HEPARIN SODIUM (PORCINE) 1000 UNIT/ML IJ SOLN
INTRAMUSCULAR | Status: AC
  Administered 2017-12-23: 2500 [IU] via INTRAVENOUS_CENTRAL
  Filled 2017-12-23: qty 3

## 2017-12-23 MED ORDER — MIDODRINE HCL 5 MG PO TABS
ORAL_TABLET | ORAL | Status: AC
  Filled 2017-12-23: qty 1

## 2017-12-23 MED ORDER — SODIUM ZIRCONIUM CYCLOSILICATE 10 G PO PACK
10.0000 g | PACK | Freq: Every day | ORAL | Status: DC
  Filled 2017-12-23: qty 1

## 2017-12-23 MED ORDER — HEPARIN SODIUM (PORCINE) 1000 UNIT/ML IJ SOLN
INTRAMUSCULAR | Status: AC
  Filled 2017-12-23: qty 6

## 2017-12-23 MED ORDER — FENTANYL CITRATE (PF) 100 MCG/2ML IJ SOLN
INTRAMUSCULAR | Status: AC
  Filled 2017-12-23: qty 2

## 2017-12-23 MED ORDER — CYCLOBENZAPRINE HCL 5 MG PO TABS: 5.0000 mg | ORAL_TABLET | Freq: Three times a day (TID) | ORAL | 0 refills | Status: DC | PRN

## 2017-12-23 MED ORDER — LORAZEPAM 2 MG/ML IJ SOLN
INTRAMUSCULAR | Status: AC
  Filled 2017-12-23: qty 1

## 2017-12-23 NOTE — Clinical Social Work Placement (Signed)
Nurse to call and report to [phone].   CLINICAL SOCIAL WORK PLACEMENT  NOTE  Date:  12/23/2017  Patient Details  Name: [name] MRN: [id number] Date of Birth: 07-Oct-1965  Clinical Social Work is seeking post-discharge placement for this patient at the [place] level of care (*CSW will initial, date and re-position this form in  chart as items are completed):  Yes   Patient/family provided with [place] Work Department's list of facilities offering this level of care within the geographic area requested by the patient (or if unable, by the patient's family).  Yes   Patient/family informed of their freedom to choose among providers that offer the needed level of care, that participate in Medicare, Medicaid or managed care program needed by the patient, have an available bed and are willing to accept the patient.  Yes   Patient/family informed of [place]'s ownership interest in [facility] and [facility], as well as of the fact that they are under no obligation to receive care at these facilities.  PASRR submitted to EDS on       PASRR number received on 12/15/17     Existing PASRR number confirmed on       FL2 transmitted to all facilities in geographic area requested by pt/family on 12/15/17     FL2 transmitted to all facilities within larger geographic area on       Patient informed that his/her managed care company has contracts with or will negotiate with certain facilities, including the following:        Yes   Patient/family informed of bed offers received.  Patient chooses bed at [facility]     Physician recommends and patient chooses bed at      Patient to be transferred to [facility] on 12/23/17.  Patient to be transferred to facility by [facility]     Patient family notified on 12/23/17 of transfer.  Name of family member notified:  Elizabeth Palau     PHYSICIAN        Additional Comment:    _______________________________________________ Gelene Mink, LCSWA 12/23/2017, 12:04 PM

## 2017-12-23 NOTE — Progress Notes (Addendum)
  PROGRESS NOTE  Patient seen in HD. Plan for discharge to SNF after HD. Ordered flexeril for neck/shoulder tightness. DC summary updated.   Dessa Phi, DO [facility] [email or web address] Password [facility] 12/23/2017, 10:38 AM

## 2017-12-23 NOTE — Progress Notes (Signed)
[facility] [place]  Subjective  - POD#2 s/p exchange right femoral collateral tunneled dialysis catheter.     Objective (!) 104/54 76 98.9 F (37.2 C) (Oral) 16 98%  Intake/Output Summary (Last 24 hours) at 12/23/2017 0927 Last data filed at 12/23/2017 0615 Gross per 24 hour  Intake 694.66 ml  Output 3000 ml  Net -2305.34 ml    Right femoral tunneled dialysis catheter  Laboratory Lab Results: Recent Labs    12/22/17 0526 12/23/17 0653  WBC 4.4 4.4  HGB 9.5* 9.1*  HCT 32.3* 30.5*  PLT 134* 126*   BMET Recent Labs    12/22/17 0526 12/23/17 0419  NA 133* 132*  K 6.2* 5.9*  CL 95* 96*  CO2 21* 21*  GLUCOSE 78 71  BUN 81* 65*  CREATININE 14.21* 12.43*  CALCIUM 9.2 9.1    COAG Lab Results  Component Value Date   INR 1.14 12/13/2017   INR 1.15 12/07/2017   INR 1.05 01/14/2016   No results found for: PTT  Assessment/Planning: Catheter exchanged in OR Friday night.  Worked decent yesterday with BFP ~300.  Working decent this morning in dialysis.  Discussed if this fails will need translumbar IVC with IR.   Marty Heck 12/23/2017 9:27 AM --

## 2017-12-23 NOTE — Progress Notes (Addendum)
[facility] Progress Note   Subjective:  Seen on HD, 3L UF goal and tolerating. TDC running reasonable with BFR 315 at this time. Still with L neck/shoulder pains - unclear etiology.  Objective Vitals:   12/23/17 0621 12/23/17 0638 12/23/17 0643 12/23/17 0700  BP: 121/68 124/67 (!) 142/70 116/65  Pulse: 70 69 69 70  Resp: 16     Temp: 98.9 F (37.2 C)     TempSrc: Oral     SpO2: 98%     Weight: 121 kg     Height:       Physical Exam General: Well appearing man, NAD. Heart: RRR; 3/6 systolic murmur Lungs: CTA anteriorly Extremities: Bandaged R foot, LLE with 1+ pitting edema\ Dialysis Access: R femoral [facility]  Additional Objective Labs: Basic Metabolic Panel: Recent Labs  Lab 12/17/17 0437 12/21/17 1304 12/22/17 0526 12/23/17 0419  NA 133* 134* 133* 132*  K 5.9* 5.7* 6.2* 5.9*  CL 96* 95* 95* 96*  CO2 21* 23 21* 21*  GLUCOSE 91 89 78 71  BUN 77* 74* 81* 65*  CREATININE 13.48* 12.84* 14.21* 12.43*  CALCIUM 8.6* 9.8 9.2 9.1  PHOS 5.7*  --   --   --    Liver Function Tests: Recent Labs  Lab 12/17/17 0437 12/22/17 0526  AST  --  10*  ALT  --  <5  ALKPHOS  --  54  BILITOT  --  0.6  PROT  --  8.5*  ALBUMIN 2.5* 2.7*   CBC: Recent Labs  Lab 12/17/17 0715 12/21/17 1304 12/22/17 0526 12/23/17 0653  WBC 6.2 5.5 4.4 4.4  NEUTROABS  --   --  3.2  --   HGB 9.5* 9.6* 9.5* 9.1*  HCT 32.3* 32.9* 32.3* 30.5*  MCV 97.6 98.2 99.4 99.0  PLT 172 146* 134* 126*   CBG: Recent Labs  Lab 12/21/17 1520  GLUCAP 89   Medications: . sodium chloride    . sodium chloride    . sodium chloride Stopped (12/21/17 1950)  . [START ON 12/24/2017]  ceFAZolin (ANCEF) IV     . calcium acetate  2,668 mg Oral TID WC  . Chlorhexidine Gluconate Cloth  6 each Topical Q0600  . clopidogrel  75 mg Oral Daily  . famotidine  40 mg Oral Q1400  . fentaNYL      . levothyroxine  125 mcg Oral Q0600  . midodrine  5 mg Oral TID WC  . promethazine  25 mg Oral BID  . sodium  polystyrene  15 g Oral Once per day on Sun Sat  . sodium polystyrene  30 g Oral Once  . traZODone  100 mg Oral QHS  . zolpidem  10 mg Oral QHS   Dialysis Orders: MWF -AF 4.5h 450/800 122.5kg 2/2.25 bath R thigh TDC Hep 3700  -Parsabiv 5mg  qHD -Hectorol76mcg IV qHD  Assessment/Plan: 1.  Dysfunctional HD catheter: S/p exchange over wire 12/20 - working decent, not perfect, BFR ~300 (goal 400-500). Will continue to watch for now - he would like to delay moving to translumbar catheter as long as possible. 2.  Hyperkalemia: Start Lokelma DAILY due to chronic hyperkalemia. Will work on getting as outpatient. 3.  ESRD: S/p HD yesterday (make-up for missed d/t TDC issues). Now back to usual "holiday schedule (Usual MWF = SuTuF this week). 4.  Hypotension/volume: Chronically low, on midodrine. 5.  Anemia: Hgb 9.5, monitor and adjust outpt ESA dose if needed. 6.  Metabolic bone disease: Ca ok, follow. 7.  Hx spina bifida/wheelchair bound 8.  L neck/back pain: On exam, very tight trapezius muscles, ?MSK. Defer to primary team. 9. Dispo: for dc to SNF today after HD  [name], PA-C 12/23/2017, 8:43 AM  [facility] Pager: [phone]  Pt seen, examined and agree w A/P as above.  [name] MD [employer] pager [phone]   12/23/2017, 11:22 AM

## 2017-12-23 NOTE — Progress Notes (Signed)
Patient will DC to: [facility] Anticipated DC date: 12/23/2017 Family notified: Yes Transport by: Corey Harold   Per MD patient ready for DC to . RN, patient, patient's family, and facility notified of DC. Discharge Summary and FL2 sent to facility. RN to call report prior to discharge [phone](). DC packet on chart. Ambulance transport requested for patient.   CSW will sign off for now as social work intervention is no longer needed. Please consult [place] again if new needs arise.  Makaylah Oddo, LCSW-A [place]/Clinical Social Work Department Cell: [phone]

## 2017-12-24 ENCOUNTER — Encounter: Payer: Self-pay | Admitting: Internal Medicine

## 2017-12-24 NOTE — Progress Notes (Signed)
Opened in error

## 2017-12-27 ENCOUNTER — Encounter (INDEPENDENT_AMBULATORY_CARE_PROVIDER_SITE_OTHER): Payer: Self-pay | Admitting: Physician Assistant

## 2017-12-27 ENCOUNTER — Ambulatory Visit (INDEPENDENT_AMBULATORY_CARE_PROVIDER_SITE_OTHER): Payer: Medicare Other | Admitting: Physician Assistant

## 2017-12-27 DIAGNOSIS — I739 Peripheral vascular disease, unspecified: Secondary | ICD-10-CM

## 2017-12-27 DIAGNOSIS — Z89412 Acquired absence of left great toe: Secondary | ICD-10-CM

## 2017-12-27 DIAGNOSIS — Z992 Dependence on renal dialysis: Secondary | ICD-10-CM

## 2017-12-27 DIAGNOSIS — E440 Moderate protein-calorie malnutrition: Secondary | ICD-10-CM

## 2017-12-27 DIAGNOSIS — N186 End stage renal disease: Secondary | ICD-10-CM

## 2017-12-27 LAB — CBC AND DIFFERENTIAL
HCT: 30 — AB (ref 41–53)
Hemoglobin: 9.8 — AB (ref 13.5–17.5)
Platelets: 132 — AB (ref 150–399)
WBC: 4.8

## 2017-12-27 NOTE — Progress Notes (Signed)
Office Visit Note   Patient: [name]           Date of Birth: September 24, 1965           MRN: [id number] Visit Date: 12/27/2017              Requested by: [name], MD [place], [place] [id number] PCP: [name], MD  No chief complaint on file.     HPI: The patient is a 52 yo gentleman who is seen for post operative follow up following left great toe amputation for osteomyelitis on 12/11/2017.  He is POD#16 and reports he has been doing well. He is trying to keep weight off the area and elevating as much as possible.  Assessment & Plan: Visit Diagnoses:  1. Left great toe amputee ([place])   2. PAD (peripheral artery disease) ([place])   3. ESRD (end stage renal disease) on dialysis ([place])   4. Moderate protein-calorie malnutrition ([place])     Plan: Sutures were harvested this visit. Patient instructed to off load as much as possible and to elevate as much as possible. Instructed may clean the site and use Dial soap and water to the area and then gauze and Ace wrap. Follow up in 2 weeks.   Follow-Up Instructions: Return in about 2 weeks (around 01/10/2018).   Ortho Exam  Patient is alert, oriented, no adenopathy, well-dressed, normal affect, normal respiratory effort. Left great toe amputation site- sutures harvested this visit. No signs of infection or cellulitis. Good pedal pulses.   Imaging: No results found. No images are attached to the encounter.  Labs: Lab Results  Component Value Date   REPTSTATUS 12/17/2017 FINAL 12/12/2017   GRAMSTAIN  10/16/2012    RARE WBC PRESENT, PREDOMINANTLY PMN NO SQUAMOUS EPITHELIAL CELLS SEEN NO ORGANISMS SEEN Performed at [facility]  10/16/2012    RARE WBC PRESENT, PREDOMINANTLY PMN NO SQUAMOUS EPITHELIAL CELLS SEEN NO ORGANISMS SEEN Performed at [facility]  12/12/2017    NO GROWTH 5 DAYS Performed at [facility], [place] [address], [place],  [place] [address]    [place] 12/07/2017     Lab Results  Component Value Date   ALBUMIN 2.7 (L) 12/22/2017   ALBUMIN 2.5 (L) 12/17/2017   ALBUMIN 2.6 (L) 12/15/2017    There is no height or weight on file to calculate BMI.  Orders:  No orders of the defined types were placed in this encounter.  No orders of the defined types were placed in this encounter.    Procedures: No procedures performed  Clinical Data: No additional findings.  ROS:  All other systems negative, except as noted in the HPI. Review of Systems  Objective: Vital Signs: There were no vitals taken for this visit.  Specialty Comments:  No specialty comments available.  PMFS History: Patient Active Problem List   Diagnosis Date Noted  . PAD (peripheral artery disease) ([place]) 12/22/2017  . Problem with dialysis access ([place]) 12/21/2017  . Anemia 12/21/2017  . Hypothyroidism 12/21/2017  . GERD (gastroesophageal reflux disease) 12/21/2017  . Anxiety 12/21/2017  . Left great toe amputee ([place]) 12/19/2017  . Endocarditis of mitral valve   . Left foot infection   . MSSA bacteremia 12/08/2017  . Fever   . Right flank pain   . ESRD (end stage renal disease) ([place]) 09/20/2017  . ESRD (end stage renal disease) on dialysis ([place]) 08/07/2017  .  Nonhealing surgical wound 02/29/2016  . Clotted dialysis access ([place]) 01/14/2016  . Removal of staples 07/19/2012  . Other complications due to renal dialysis device, implant, and graft 06/27/2012  . End stage renal disease ([place]) 06/27/2012   Past Medical History:  Diagnosis Date  . Anemia   . Anxiety   . Constipation   . End stage renal failure on dialysis [facility])    "[place]; MWF" (02/29/2016)  . GERD (gastroesophageal reflux disease)   . Grand mal seizure ([place]) X 4   last 1998 (02/29/2016)  . Heart murmur    no problems with per pt. - nephrologist and Dr. Coletta Memos  . History of blood transfusion 2000s   "I was having blood loss; never found  out from where"  . History of kidney stones   . Hypertension    hx of - has not taken bp meds in over 2 years  . Hypothyroidism   . Insomnia   . Migraine    "due to my BP in my late 1990s; none since" (02/29/2016)  . Nonhealing surgical wound    of the left arteriovenous graft  . Pneumonia    x 2  . Shortness of breath    rarely  . Spina bifida ([place])     Family History  Problem Relation Age of Onset  . Diabetes Mother   . Hypertension Mother   . Heart disease Mother        before age 57  . Diabetes Father   . Heart attack Father        X's 3  . Diabetes Sister   . Bipolar disorder Sister     Past Surgical History:  Procedure Laterality Date  . AMPUTATION Left 12/11/2017   Procedure: Left Great Toe Amputation;  Surgeon: [name], MD;  Location: [facility];  Service: Orthopedics;  Laterality: Left;  . APPENDECTOMY    . ARTERIOVENOUS GRAFT PLACEMENT Left 10/16/2012   left femoral goretex graft         Dr Donnetta Hutching  . AV FISTULA PLACEMENT Left 06/27/2012   Procedure: EXPLORATORY LEFT THY-GRAFT PSEUDO-ANEURYSM;  Surgeon: Conrad , MD;  Location: [facility];  Service: Vascular;  Laterality: Left;  Revision of left Arteriovenus gortex graft in thigh.  . COLONOSCOPY    . I&D EXTREMITY Left 02/29/2016   Procedure: DEBRIDEMENT LEFT THIGH WOUND;  Surgeon: Waynetta Sandy, MD;  Location: [facility];  Service: Vascular;  Laterality: Left;  . ILEOSTOMY  1970s  . INCISION AND DRAINAGE Left 10/16/2012   Procedure: INCISION AND Debridement left thigh graft;  Surgeon: Rosetta Posner, MD;  Location: [facility];  Service: Vascular;  Laterality: Left;  . INSERTION OF DIALYSIS CATHETER    . INSERTION OF DIALYSIS CATHETER  01/14/2016   Procedure: INSERTION OF DIALYSIS CATHETER;  Surgeon: Waynetta Sandy, MD;  Location: [facility];  Service: Vascular;;  . INSERTION OF DIALYSIS CATHETER Right 08/07/2017   Procedure: INSERTION OF DIALYSIS CATHETER;  Surgeon: Waynetta Sandy, MD;  Location: [facility];  Service: Vascular;  Laterality: Right;  . INSERTION OF DIALYSIS CATHETER Right 12/21/2017   Procedure: RIGHT FEMORAL DIALYSIS CATHETER EXCHANGE;  Surgeon: Marty Heck, MD;  Location: [facility];  Service: Vascular;  Laterality: Right;  . INSERTION OF ILIAC STENT Left 01/14/2016   Procedure: INSERTION OF ILIAC STENT;  Surgeon: Waynetta Sandy, MD;  Location: [facility];  Service: Vascular;  Laterality: Left;  . INTRAOPERATIVE ARTERIOGRAM Left 01/14/2016   Procedure: INTRA  OPERATIVE ARTERIOGRAM;  Surgeon: Waynetta Sandy, MD;  Location: [facility];  Service: Vascular;  Laterality: Left;  . LITHOTRIPSY  x 3   "& a laser treatment" (02/29/2016)  . NEPHROSTOMY Bilateral 1968  . PATELLAR TENDON REPAIR Right 1990s   "big incision"  . PERIPHERAL VASCULAR CATHETERIZATION  01/14/2016   Procedure: A/V SHUNTOGRAM;  Surgeon: Waynetta Sandy, MD;  Location: [facility];  Service: Vascular;;  . REVISION OF ARTERIOVENOUS GORETEX GRAFT Left 10/16/2012   Procedure: REVISION OF LEFT FEMORAL LOOP ARTERIOVENOUS GORETEX GRAFT;  Surgeon: Rosetta Posner, MD;  Location: [place];  Service: Vascular;  Laterality: Left;  . REVISION OF ARTERIOVENOUS GORETEX GRAFT Left 02/11/2015   Procedure: EXCISION OF SMALL SEGMENT OF EXPOSED LEFT THIGH NON FUNCTIONING  ARTERIOVENOUS GORETEX GRAFT;  Surgeon: Mal Misty, MD;  Location: [facility];  Service: Vascular;  Laterality: Left;  . REVISION OF ARTERIOVENOUS GORETEX GRAFT Left 01/14/2016   Procedure: REVISION OF ARTERIOVENOUS GORETEX GRAFT;  Surgeon: Waynetta Sandy, MD;  Location: [place] [facility];  Service: Vascular;  Laterality: Left;  . REVISION OF ARTERIOVENOUS GORETEX GRAFT Left 02/29/2016   thigh/pt report  . REVISION OF ARTERIOVENOUS GORETEX GRAFT Left 02/29/2016   Procedure: POSSIBLE REVISION OF LEFT THIGH ARTERIOVENOUS GORETEX GRAFT;  Surgeon: Waynetta Sandy, MD;  Location: [facility];  Service: Vascular;  Laterality: Left;  . TEE WITHOUT CARDIOVERSION N/A  12/14/2017   Procedure: TRANSESOPHAGEAL ECHOCARDIOGRAM (TEE);  Surgeon: Acie Fredrickson Wonda Cheng, MD;  Location: [facility] END[facility]COPY;  Service: Cardiovascular;  Laterality: N/A;  . THROMBECTOMY W/ EMBOLECTOMY Left 01/14/2016   Procedure: THROMBECTOMY ARTERIOVENOUS GORE-TEX Left thigh GRAFT;  Surgeon: Waynetta Sandy, MD;  Location: [facility];  Service: Vascular;  Laterality: Left;  . TONGUE SURGERY  ~ 1990   tongue-tie release   . ULTRASOUND GUIDANCE FOR VASCULAR ACCESS Right 08/07/2017   Procedure: ULTRASOUND GUIDANCE FOR VASCULAR CANNULATION RIGHT FEMORAL VEIN AND LEFT AV FEMORAL GRAFT;  Surgeon: Waynetta Sandy, MD;  Location: [facility];  Service: Vascular;  Laterality: Right;  . UPPER EXTREMITY VENOGRAPHY Bilateral 08/09/2017   Procedure: UPPER EXTREMITY VENOGRAPHY;  Surgeon: Serafina Mitchell, MD;  Location: [facility] LAB;  Service: Cardiovascular;  Laterality: Bilateral;  . VENOGRAM N/A 09/20/2017   Procedure: RIGHT COMMON FEMORAL ARTERY EXPLORATION.  CANNULATION RIGHT COMMON FEMORAL VEIN. VENOGRAM CENTRAL ULTRA SOUND GUIDED RIGHT FEMORAL VEIN TIMES TWO.;  Surgeon: Waynetta Sandy, MD;  Location: [facility];  Service: Vascular;  Laterality: N/A;  . WOUND DEBRIDEMENT Left 02/29/2016   thigh   Social History   Occupational History  . Occupation: Disabled  Tobacco Use  . Smoking status: Never Smoker  . Smokeless tobacco: Never Used  Substance and Sexual Activity  . Alcohol use: Not Currently    Alcohol/week: 0.0 standard drinks    Comment: 02/29/2016 "nothing since  the mid 1990s  . Drug use: Not Currently    Types: Marijuana    Comment: 02/29/2016 "nothing since ~ 1995"  . Sexual activity: Never

## 2017-12-31 ENCOUNTER — Other Ambulatory Visit: Payer: Self-pay

## 2018-01-01 MED ORDER — OXYCODONE-ACETAMINOPHEN 10-325 MG PO TABS: 1.0000 | ORAL_TABLET | Freq: Three times a day (TID) | ORAL | 0 refills | Status: DC

## 2018-01-10 ENCOUNTER — Ambulatory Visit (INDEPENDENT_AMBULATORY_CARE_PROVIDER_SITE_OTHER): Payer: Medicare Other | Admitting: Physician Assistant

## 2018-01-10 ENCOUNTER — Encounter (INDEPENDENT_AMBULATORY_CARE_PROVIDER_SITE_OTHER): Payer: Self-pay | Admitting: Physician Assistant

## 2018-01-10 DIAGNOSIS — I739 Peripheral vascular disease, unspecified: Secondary | ICD-10-CM

## 2018-01-10 DIAGNOSIS — Z89412 Acquired absence of left great toe: Secondary | ICD-10-CM

## 2018-01-10 DIAGNOSIS — N186 End stage renal disease: Secondary | ICD-10-CM

## 2018-01-10 DIAGNOSIS — Z992 Dependence on renal dialysis: Secondary | ICD-10-CM

## 2018-01-10 DIAGNOSIS — E440 Moderate protein-calorie malnutrition: Secondary | ICD-10-CM

## 2018-01-10 LAB — BASIC METABOLIC PANEL
BUN: 52 — AB (ref 4–21)
Creatinine: 8.6 — AB (ref 0.6–1.3)
Glucose: 79
Potassium: 5.2 (ref 3.4–5.3)
Sodium: 138 (ref 137–147)

## 2018-01-10 LAB — CBC AND DIFFERENTIAL
HCT: 30 — AB (ref 41–53)
Hemoglobin: 9.9 — AB (ref 13.5–17.5)
Platelets: 121 — AB (ref 150–399)
WBC: 5

## 2018-01-10 LAB — POCT ERYTHROCYTE SEDIMENTATION RATE, NON-AUTOMATED: Sed Rate: 103

## 2018-01-10 NOTE — Progress Notes (Signed)
Office Visit Note   Patient: [name]           Date of Birth: 1965/06/30           MRN: [id number] Visit Date: 01/10/2018              Requested by: [name], MD [place], [place] [id number] PCP: [name], MD  Chief Complaint  Patient presents with  . Left Great Toe - Follow-up, Routine Post Op      HPI: The patient is a 53 year old gentleman who is seen for postoperative follow-up following a left great toe amputation for osteomyelitis on 12/11/2017.  He continues to reside in a skilled nursing facility.  He has spina bifida and myeloma meningocele and is ambulatory for short distances but utilizes a wheelchair for long distance ambulation.  He continues on antibiotics for his MSSA bacteremia/endocarditis felt to be related to his osteomyelitis from his toe.  The plan is for a total of 6 weeks of antibiotics.  He is also end-stage renal disease on hemodialysis.  Assessment & Plan: Visit Diagnoses:  1. Left great toe amputee ([place])   2. PAD (peripheral artery disease) ([place])   3. ESRD (end stage renal disease) on dialysis ([place])   4. Moderate protein-calorie malnutrition ([place])     Plan: Continue antibiotic therapy.  He is okay to progress to weightbearing as tolerated on the left foot in his postop shoe.  Dry dressings to the left foot daily after cleaning the foot daily with Dial soap and water.  He will follow-up here in 2 weeks or sooner should he have difficulty in the interim.  Follow-Up Instructions: Return in about 2 weeks (around 01/24/2018).   Ortho Exam  Patient is alert, oriented, no adenopathy, well-dressed, normal affect, normal respiratory effort. Patient presents in the wheelchair with staff from the nursing facility.  The left foot incisional area is healing well and with without signs of infection or cellulitis.  He has good pedal pulses and the foot is warm.  There is very mild to no edema.  There is loose peeling  skin which was removed. Imaging: No results found. No images are attached to the encounter.  Labs: Lab Results  Component Value Date   ESRSEDRATE 103 01/10/2018   REPTSTATUS 12/17/2017 FINAL 12/12/2017   GRAMSTAIN  10/16/2012    RARE WBC PRESENT, PREDOMINANTLY PMN NO SQUAMOUS EPITHELIAL CELLS SEEN NO ORGANISMS SEEN Performed at [facility]  10/16/2012    RARE WBC PRESENT, PREDOMINANTLY PMN NO SQUAMOUS EPITHELIAL CELLS SEEN NO ORGANISMS SEEN Performed at [facility]  12/12/2017    NO GROWTH 5 DAYS Performed at [facility], [place] [address] [place], [place] [address]    [place] 12/07/2017     Lab Results  Component Value Date   ALBUMIN 2.7 (L) 12/22/2017   ALBUMIN 2.5 (L) 12/17/2017   ALBUMIN 2.6 (L) 12/15/2017    There is no height or weight on file to calculate BMI.  Orders:  No orders of the defined types were placed in this encounter.  No orders of the defined types were placed in this encounter.    Procedures: No procedures performed  Clinical Data: No additional findings.  ROS:  All other systems negative, except as noted in the HPI. Review of Systems  Objective: Vital Signs: There were no vitals taken for this visit.  Specialty Comments:  No specialty comments  available.  PMFS History: Patient Active Problem List   Diagnosis Date Noted  . PAD (peripheral artery disease) ([place]) 12/22/2017  . Problem with dialysis access ([place]) 12/21/2017  . Anemia 12/21/2017  . Hypothyroidism 12/21/2017  . GERD (gastroesophageal reflux disease) 12/21/2017  . Anxiety 12/21/2017  . Left great toe amputee ([place]) 12/19/2017  . Endocarditis of mitral valve   . Left foot infection   . MSSA bacteremia 12/08/2017  . Fever   . Right flank pain   . ESRD (end stage renal disease) ([place]) 09/20/2017  . ESRD (end stage renal disease) on dialysis ([place]) 08/07/2017  . Nonhealing surgical wound 02/29/2016  .  Clotted dialysis access ([place]) 01/14/2016  . Removal of staples 07/19/2012  . Other complications due to renal dialysis device, implant, and graft 06/27/2012  . End stage renal disease ([place]) 06/27/2012   Past Medical History:  Diagnosis Date  . Anemia   . Anxiety   . Constipation   . End stage renal failure on dialysis [facility])    "[place]; MWF" (02/29/2016)  . GERD (gastroesophageal reflux disease)   . Grand mal seizure ([place]) X 4   last 1998 (02/29/2016)  . Heart murmur    no problems with per pt. - nephrologist and Dr. Coletta Memos  . History of blood transfusion 2000s   "I was having blood loss; never found out from where"  . History of kidney stones   . Hypertension    hx of - has not taken bp meds in over 2 years  . Hypothyroidism   . Insomnia   . Migraine    "due to my BP in my late 1990s; none since" (02/29/2016)  . Nonhealing surgical wound    of the left arteriovenous graft  . Pneumonia    x 2  . Shortness of breath    rarely  . Spina bifida ([place])     Family History  Problem Relation Age of Onset  . Diabetes Mother   . Hypertension Mother   . Heart disease Mother        before age 52  . Diabetes Father   . Heart attack Father        X's 3  . Diabetes Sister   . Bipolar disorder Sister     Past Surgical History:  Procedure Laterality Date  . AMPUTATION Left 12/11/2017   Procedure: Left Great Toe Amputation;  Surgeon: [name], MD;  Location: [facility];  Service: Orthopedics;  Laterality: Left;  . APPENDECTOMY    . ARTERIOVENOUS GRAFT PLACEMENT Left 10/16/2012   left femoral goretex graft         Dr Donnetta Hutching  . AV FISTULA PLACEMENT Left 06/27/2012   Procedure: EXPLORATORY LEFT THY-GRAFT PSEUDO-ANEURYSM;  Surgeon: Conrad Webb, MD;  Location: [facility];  Service: Vascular;  Laterality: Left;  Revision of left Arteriovenus gortex graft in thigh.  . COLONOSCOPY    . I&D EXTREMITY Left 02/29/2016   Procedure: DEBRIDEMENT LEFT THIGH WOUND;  Surgeon: Waynetta Sandy,  MD;  Location: [facility];  Service: Vascular;  Laterality: Left;  . ILEOSTOMY  1970s  . INCISION AND DRAINAGE Left 10/16/2012   Procedure: INCISION AND Debridement left thigh graft;  Surgeon: Rosetta Posner, MD;  Location: [facility];  Service: Vascular;  Laterality: Left;  . INSERTION OF DIALYSIS CATHETER    . INSERTION OF DIALYSIS CATHETER  01/14/2016   Procedure: INSERTION OF DIALYSIS CATHETER;  Surgeon: Waynetta Sandy, MD;  Location: [facility];  Service: Vascular;;  . INSERTION OF DIALYSIS CATHETER Right 08/07/2017   Procedure: INSERTION OF DIALYSIS CATHETER;  Surgeon: Waynetta Sandy, MD;  Location: [facility];  Service: Vascular;  Laterality: Right;  . INSERTION OF DIALYSIS CATHETER Right 12/21/2017   Procedure: RIGHT FEMORAL DIALYSIS CATHETER EXCHANGE;  Surgeon: Marty Heck, MD;  Location: [facility];  Service: Vascular;  Laterality: Right;  . INSERTION OF ILIAC STENT Left 01/14/2016   Procedure: INSERTION OF ILIAC STENT;  Surgeon: Waynetta Sandy, MD;  Location: [facility];  Service: Vascular;  Laterality: Left;  . INTRAOPERATIVE ARTERIOGRAM Left 01/14/2016   Procedure: INTRA OPERATIVE ARTERIOGRAM;  Surgeon: Waynetta Sandy, MD;  Location: [facility];  Service: Vascular;  Laterality: Left;  . LITHOTRIPSY  x 3   "& a laser treatment" (02/29/2016)  . NEPHROSTOMY Bilateral 1968  . PATELLAR TENDON REPAIR Right 1990s   "big incision"  . PERIPHERAL VASCULAR CATHETERIZATION  01/14/2016   Procedure: A/V SHUNTOGRAM;  Surgeon: Waynetta Sandy, MD;  Location: [facility];  Service: Vascular;;  . REVISION OF ARTERIOVENOUS GORETEX GRAFT Left 10/16/2012   Procedure: REVISION OF LEFT FEMORAL LOOP ARTERIOVENOUS GORETEX GRAFT;  Surgeon: Rosetta Posner, MD;  Location: [facility];  Service: Vascular;  Laterality: Left;  . REVISION OF ARTERIOVENOUS GORETEX GRAFT Left 02/11/2015   Procedure: EXCISION OF SMALL SEGMENT OF EXPOSED LEFT THIGH NON FUNCTIONING  ARTERIOVENOUS GORETEX GRAFT;  Surgeon: Mal Misty, MD;  Location: [facility];  Service: Vascular;  Laterality: Left;  . REVISION OF ARTERIOVENOUS GORETEX GRAFT Left 01/14/2016   Procedure: REVISION OF ARTERIOVENOUS GORETEX GRAFT;  Surgeon: Waynetta Sandy, MD;  Location: [facility];  Service: Vascular;  Laterality: Left;  . REVISION OF ARTERIOVENOUS GORETEX GRAFT Left 02/29/2016   thigh/pt report  . REVISION OF ARTERIOVENOUS GORETEX GRAFT Left 02/29/2016   Procedure: POSSIBLE REVISION OF LEFT THIGH ARTERIOVENOUS GORETEX GRAFT;  Surgeon: Waynetta Sandy, MD;  Location: [facility];  Service: Vascular;  Laterality: Left;  . TEE WITHOUT CARDIOVERSION N/A 12/14/2017   Procedure: TRANSESOPHAGEAL ECHOCARDIOGRAM (TEE);  Surgeon: Acie Fredrickson Wonda Cheng, MD;  Location: [facility] ENDOSCOPY;  Service: Cardiovascular;  Laterality: N/A;  . THROMBECTOMY W/ EMBOLECTOMY Left 01/14/2016   Procedure: THROMBECTOMY ARTERIOVENOUS GORE-TEX Left thigh GRAFT;  Surgeon: Waynetta Sandy, MD;  Location: [facility];  Service: Vascular;  Laterality: Left;  . TONGUE SURGERY  ~ 1990   tongue-tie release   . ULTRASOUND GUIDANCE FOR VASCULAR ACCESS Right 08/07/2017   Procedure: ULTRASOUND GUIDANCE FOR VASCULAR CANNULATION RIGHT FEMORAL VEIN AND LEFT AV FEMORAL GRAFT;  Surgeon: Waynetta Sandy, MD;  Location: [facility];  Service: Vascular;  Laterality: Right;  . UPPER EXTREMITY VENOGRAPHY Bilateral 08/09/2017   Procedure: UPPER EXTREMITY VENOGRAPHY;  Surgeon: Serafina Mitchell, MD;  Location: [facility] LAB;  Service: Cardiovascular;  Laterality: Bilateral;  . VENOGRAM N/A 09/20/2017   Procedure: RIGHT COMMON FEMORAL ARTERY EXPLORATION.  CANNULATION RIGHT COMMON FEMORAL VEIN. VENOGRAM CENTRAL ULTRA SOUND GUIDED RIGHT FEMORAL VEIN TIMES TWO.;  Surgeon: Waynetta Sandy, MD;  Location: [facility];  Service: Vascular;  Laterality: N/A;  . WOUND DEBRIDEMENT Left 02/29/2016   thigh   Social History   Occupational History  . Occupation: Disabled  Tobacco Use  . Smoking  status: Never Smoker  . Smokeless tobacco: Never Used  Substance and Sexual Activity  . Alcohol use: Not Currently    Alcohol/week: 0.0 standard drinks    Comment: 02/29/2016 "nothing since  the mid 1990s  . Drug use: Not  Currently    Types: Marijuana    Comment: 02/29/2016 "nothing since ~ 1995"  . Sexual activity: Never

## 2018-01-11 ENCOUNTER — Encounter: Payer: Self-pay | Admitting: Cardiology

## 2018-01-11 ENCOUNTER — Ambulatory Visit (INDEPENDENT_AMBULATORY_CARE_PROVIDER_SITE_OTHER): Payer: Medicare Other | Admitting: Cardiology

## 2018-01-11 VITALS — BP 118/64 | HR 71 | Ht 71.0 in | Wt 267.9 lb

## 2018-01-11 DIAGNOSIS — R011 Cardiac murmur, unspecified: Secondary | ICD-10-CM

## 2018-01-11 DIAGNOSIS — M8618 Other acute osteomyelitis, other site: Secondary | ICD-10-CM | POA: Diagnosis not present

## 2018-01-11 DIAGNOSIS — I330 Acute and subacute infective endocarditis: Secondary | ICD-10-CM

## 2018-01-11 NOTE — Progress Notes (Signed)
Cardiology Office Note   Date:  01/11/2018   ID:  Riker Collier, DOB [birth year], MRN [id number]  PCP:  [name], MD  Cardiologist:  Dr. Angelena Form     Chief Complaint  Patient presents with  . Endocarditis      History of Present Illness: Joziyah Roblero is a 53 y.o. male who presents for post hospitalization for endocarditis  D/c 12/16   He has ahistory of HTN, ESRD on HD, GERD, seizure disorder, HTN and hypothyroidism.  He has spinal bifida with myelomeningocele and kidney stones. He has no known cardiac disease. He had an echo in February 2019 but not in our system. It appears this was done in [facility]. He has an ileostomy. He has undergone stenting of the left iliac artery in 2018.  He is followed closely by Dr. Donzetta Matters in [facility] with attempts at vascular access. His father had CAD.  He can walk but uses a wheelchair when he goes out of his house  Echo obtained from [facility] office. February 2019. Normal LV systolic function with QQIW=97%. Grade 2 diastolic dysfunction. Moderate aortic valve stenosis with mean gradient of 18 mmHg across the aortic valve. Moderate mitral stenosis with mean gradient 6 mmHg.)  Not know to have CAD.   Pt admitted 12/07/17 with fevers of 102.  Found to have osteomyelitis of Lt great toe and underwent amputation 12/11/17.  Also with MSSA Bactermia   TEE on 12/14/17  There was a small mobile vegetation on the annulus of MV, moderate to severe mitral annular calcification and mild MS,  mild AS.   ABX for 6 weeks   Today no complaints.  No chest pain no SOB.  Continues on antibiotics.  We discussed possible need for repeat TEE though no mention in ID note.  Pt stated he would need anesthesia to do.     Past Medical History:  Diagnosis Date  . Anemia   . Anxiety   . Constipation   . End stage renal failure on dialysis [name])    "[place]; MWF" (02/29/2016)  . GERD (gastroesophageal reflux disease)   . Grand mal seizure ([place]) X  4   last 1998 (02/29/2016)  . Heart murmur    no problems with per pt. - nephrologist and Dr. Coletta Memos  . History of blood transfusion 2000s   "I was having blood loss; never found out from where"  . History of kidney stones   . Hypertension    hx of - has not taken bp meds in over 2 years  . Hypothyroidism   . Insomnia   . Migraine    "due to my BP in my late 1990s; none since" (02/29/2016)  . Nonhealing surgical wound    of the left arteriovenous graft  . Pneumonia    x 2  . Shortness of breath    rarely  . Spina bifida [facility])     Past Surgical History:  Procedure Laterality Date  . AMPUTATION Left 12/11/2017   Procedure: Left Great Toe Amputation;  Surgeon: [name], MD;  Location: [place];  Service: Orthopedics;  Laterality: Left;  . APPENDECTOMY    . ARTERIOVENOUS GRAFT PLACEMENT Left 10/16/2012   left femoral goretex graft         Dr Donnetta Hutching  . AV FISTULA PLACEMENT Left 06/27/2012   Procedure: EXPLORATORY LEFT THY-GRAFT PSEUDO-ANEURYSM;  Surgeon: Conrad Morris, MD;  Location: [place];  Service: Vascular;  Laterality: Left;  Revision of left Arteriovenus gortex  graft in thigh.  . COLONOSCOPY    . I&D EXTREMITY Left 02/29/2016   Procedure: DEBRIDEMENT LEFT THIGH WOUND;  Surgeon: Waynetta Sandy, MD;  Location: [place];  Service: Vascular;  Laterality: Left;  . ILEOSTOMY  1970s  . INCISION AND DRAINAGE Left 10/16/2012   Procedure: INCISION AND Debridement left thigh graft;  Surgeon: Rosetta Posner, MD;  Location: [place];  Service: Vascular;  Laterality: Left;  . INSERTION OF DIALYSIS CATHETER    . INSERTION OF DIALYSIS CATHETER  01/14/2016   Procedure: INSERTION OF DIALYSIS CATHETER;  Surgeon: Waynetta Sandy, MD;  Location: [place];  Service: Vascular;;  . INSERTION OF DIALYSIS CATHETER Right 08/07/2017   Procedure: INSERTION OF DIALYSIS CATHETER;  Surgeon: Waynetta Sandy, MD;  Location: [place];  Service: Vascular;  Laterality: Right;  . INSERTION OF DIALYSIS  CATHETER Right 12/21/2017   Procedure: RIGHT FEMORAL DIALYSIS CATHETER EXCHANGE;  Surgeon: Marty Heck, MD;  Location: [place];  Service: Vascular;  Laterality: Right;  . INSERTION OF ILIAC STENT Left 01/14/2016   Procedure: INSERTION OF ILIAC STENT;  Surgeon: Waynetta Sandy, MD;  Location: [place];  Service: Vascular;  Laterality: Left;  . INTRAOPERATIVE ARTERIOGRAM Left 01/14/2016   Procedure: INTRA OPERATIVE ARTERIOGRAM;  Surgeon: Waynetta Sandy, MD;  Location: [place];  Service: Vascular;  Laterality: Left;  . LITHOTRIPSY  x 3   "& a laser treatment" (02/29/2016)  . NEPHROSTOMY Bilateral 1968  . PATELLAR TENDON REPAIR Right 1990s   "big incision"  . PERIPHERAL VASCULAR CATHETERIZATION  01/14/2016   Procedure: A/V SHUNTOGRAM;  Surgeon: Waynetta Sandy, MD;  Location: [place];  Service: Vascular;;  . REVISION OF ARTERIOVENOUS GORETEX GRAFT Left 10/16/2012   Procedure: REVISION OF LEFT FEMORAL LOOP ARTERIOVENOUS GORETEX GRAFT;  Surgeon: Rosetta Posner, MD;  Location: [place];  Service: Vascular;  Laterality: Left;  . REVISION OF ARTERIOVENOUS GORETEX GRAFT Left 02/11/2015   Procedure: EXCISION OF SMALL SEGMENT OF EXPOSED LEFT THIGH NON FUNCTIONING  ARTERIOVENOUS GORETEX GRAFT;  Surgeon: Mal Misty, MD;  Location: [place];  Service: Vascular;  Laterality: Left;  . REVISION OF ARTERIOVENOUS GORETEX GRAFT Left 01/14/2016   Procedure: REVISION OF ARTERIOVENOUS GORETEX GRAFT;  Surgeon: Waynetta Sandy, MD;  Location: [place];  Service: Vascular;  Laterality: Left;  . REVISION OF ARTERIOVENOUS GORETEX GRAFT Left 02/29/2016   thigh/pt report  . REVISION OF ARTERIOVENOUS GORETEX GRAFT Left 02/29/2016   Procedure: POSSIBLE REVISION OF LEFT THIGH ARTERIOVENOUS GORETEX GRAFT;  Surgeon: Waynetta Sandy, MD;  Location: [place];  Service: Vascular;  Laterality: Left;  . TEE WITHOUT CARDIOVERSION N/A 12/14/2017   Procedure: TRANSESOPHAGEAL ECHOCARDIOGRAM (TEE);  Surgeon:  Acie Fredrickson Wonda Cheng, MD;  Location: [facility] END[facility]COPY;  Service: Cardiovascular;  Laterality: N/A;  . THROMBECTOMY W/ EMBOLECTOMY Left 01/14/2016   Procedure: THROMBECTOMY ARTERIOVENOUS GORE-TEX Left thigh GRAFT;  Surgeon: Waynetta Sandy, MD;  Location: [place];  Service: Vascular;  Laterality: Left;  . TONGUE SURGERY  ~ 1990   tongue-tie release   . ULTRASOUND GUIDANCE FOR VASCULAR ACCESS Right 08/07/2017   Procedure: ULTRASOUND GUIDANCE FOR VASCULAR CANNULATION RIGHT FEMORAL VEIN AND LEFT AV FEMORAL GRAFT;  Surgeon: Waynetta Sandy, MD;  Location: [place];  Service: Vascular;  Laterality: Right;  . UPPER EXTREMITY VENOGRAPHY Bilateral 08/09/2017   Procedure: UPPER EXTREMITY VENOGRAPHY;  Surgeon: Serafina [place], MD;  Location: [place] CV LAB;  Service: Cardiovascular;  Laterality: Bilateral;  . VENOGRAM N/A 09/20/2017  Procedure: RIGHT COMMON FEMORAL ARTERY EXPLORATION.  CANNULATION RIGHT COMMON FEMORAL VEIN. VENOGRAM CENTRAL ULTRA SOUND GUIDED RIGHT FEMORAL VEIN TIMES TWO.;  Surgeon: Waynetta Sandy, MD;  Location: [place];  Service: Vascular;  Laterality: N/A;  . WOUND DEBRIDEMENT Left 02/29/2016   thigh     Current Outpatient Medications  Medication Sig Dispense Refill  . calcium acetate (PHOSLO) 667 MG capsule Take 2,001-2,708 mg by mouth See admin instructions. Take 2708 mg by mouth 3 times daily with meals, take 2001 mg by mouth with snacks    . ceFAZolin (ANCEF) IVPB Inject 2 g into the vein every Monday, Wednesday, and Friday with hemodialysis. Indication:  MSSA Bacteremia Last Day of Therapy:  01/19/2018 Labs - Once weekly:  CBC/D and BMP, Labs - Every other week:  ESR and CRP 15 Units 0  . clopidogrel (PLAVIX) 75 MG tablet Take 1 tablet (75 mg total) by mouth daily. 30 tablet 3  . cyclobenzaprine (FLEXERIL) 5 MG tablet Take 1 tablet (5 mg total) by mouth 3 (three) times daily as needed for muscle spasms (shoulder/neck tightness). 30 tablet 0  . famotidine (PEPCID)  40 MG tablet Take 40 mg by mouth daily at 2 PM.     . fluticasone (FLONASE) 50 MCG/ACT nasal spray Place 2 sprays into both nostrils as needed for allergies or rhinitis (congestion).     Marland Kitchen levothyroxine (SYNTHROID, LEVOTHROID) 125 MCG tablet Take 125 mcg by mouth daily before breakfast.     . LORazepam (ATIVAN) 0.5 MG tablet Take 1 tablet (0.5 mg total) by mouth every 6 (six) hours as needed for anxiety. 4 tablet 0  . methocarbamol (ROBAXIN) 500 MG tablet Take 1 tablet (500 mg total) by mouth every 6 (six) hours as needed for muscle spasms. 10 tablet 0  . Nutritional Supplements (NEPRO PO) Take 1 Can by mouth daily.    Marland Kitchen oxyCODONE-acetaminophen (PERCOCET) 10-325 MG tablet Take 1 tablet by mouth 3 (three) times daily. 90 tablet 0  . sodium polystyrene (KAYEXALATE) powder Take 15 g by mouth See admin instructions. Take 15 g (mixed in water) by mouth on Saturdays and Sundays    . traZODone (DESYREL) 100 MG tablet Take 100 mg by mouth at bedtime.    Marland Kitchen zolpidem (AMBIEN) 10 MG tablet Take 10 mg by mouth at bedtime.      No current facility-administered medications for this visit.     Allergies:   Furadantin [nitrofurantoin]; Mandelamine [methenamine]; Noroxin [norfloxacin]; Carmine; Contrast media [iodinated diagnostic agents]; Hydrocodone; Metrizamide; Sulfa antibiotics; and Sulfur    Social History:  The patient  reports that he has never smoked. He has never used smokeless tobacco. He reports previous alcohol use. He reports previous drug use. Drug: Marijuana.   Family History:  The patient's family history includes Bipolar disorder in his sister; Diabetes in his father, mother, and sister; Heart attack in his father; Heart disease in his mother; Hypertension in his mother.    ROS:  General:no colds or fevers, no weight changes Skin:no rashes or ulcers HEENT:no blurred vision, no congestion CV:see HPI PUL:see HPI GI:no diarrhea constipation or melena, no indigestion GU:no hematuria, no  dysuria MS:no joint pain, no claudication Neuro:no syncope, no lightheadedness   Wt Readings from Last 3 Encounters:  01/11/18 267 lb 14.4 oz (121.5 kg)  12/23/17 262 lb 9.1 oz (119.1 kg)  12/19/17 270 lb (122.5 kg)     PHYSICAL EXAM: VS:  BP 118/64   Pulse 71   Ht '5\' 11"'  (1.803 m)  Wt 267 lb 14.4 oz (121.5 kg)   SpO2 97%   BMI 37.36 kg/m  , BMI Body mass index is 37.36 kg/m. General:Pleasant affect, NAD Skin:Warm and dry, brisk capillary refill HEENT:normocephalic, sclera clear, mucus membranes moist Neck:supple, no JVD, no bruits  Heart:S1S2 RRR with 6-2/1 systolic murmur,no gallup, rub or click Lungs:clear without rales, rhonchi, or wheezes HYQ:MVHQ, non tender, + BS, do not palpate liver spleen or masses Ext:no lower ext edema, dressing on Lt foot Neuro:alert and oriented, MAE, follows commands, + facial symmetry    EKG:  EKG is NOT ordered today.   Recent Labs: 12/22/2017: ALT <5 12/27/2017: Hemoglobin 9.8; Platelets 132 01/10/2018: BUN 52; Creatinine 8.6; Potassium 5.2; Sodium 138    Lipid Panel No results found for: CHOL, TRIG, HDL, CHOLHDL, VLDL, LDLCALC, LDLDIRECT     Other studies Reviewed: Additional studies/ records that were reviewed today include: . Echo 12/10/17 Study Conclusions  - Left ventricle: The cavity size was normal. There was mild   concentric hypertrophy. Systolic function was normal. The   estimated ejection fraction was in the range of 60% to 65%. There   was dynamic obstruction at restin the mid cavity, with a peak   velocity of 231 cm/sec and a peak gradient of 21 mm Hg. Wall   motion was normal; there were no regional wall motion   abnormalities. Doppler parameters are consistent with abnormal   left ventricular relaxation (grade 1 diastolic dysfunction).   Doppler parameters are consistent with high ventricular filling   pressure. - Aortic valve: A bicuspid morphology cannot be excluded;   moderately thickened, moderately  calcified leaflets. Valve   mobility was restricted. There was moderate stenosis. There was   no regurgitation. Peak velocity (S): 310 cm/s. Mean gradient (S):   22 mm Hg. Valve area (VTI): 2.2 cm^2. Valve area (Vmax): 1.97   cm^2. Valve area (Vmean): 1.82 cm^2. - Mitral valve: Transvalvular velocity was within the normal range.   There was no evidence for stenosis. There was trivial   regurgitation. - Left atrium: The atrium was severely dilated. - Right ventricle: The cavity size was normal. Wall thickness was   normal. Systolic function was normal. - Atrial septum: No defect or patent foramen ovale was identified   by color flow Doppler. - Tricuspid valve: There was trivial regurgitation. - Pulmonary arteries: Systolic pressure was within the normal   range. PA peak pressure: 20 mm Hg (S).  Echo TEE 12/14/17  Study Conclusions  - Left ventricle: The cavity size was normal. Wall thickness was   normal. Systolic function was normal. - Aortic valve: Valve mobility was moderately restricted. No   evidence of vegetation. - Mitral valve: Mildly to moderately calcified annulus. There was a   small vegetation on the atrial aspect of the base of the   posterior leaflet; the appearance is consistent with vegetation. - Left atrium: No evidence of thrombus in the atrial cavity or   appendage. - Atrial septum: No defect or patent foramen ovale was identified. - Tricuspid valve: No evidence of vegetation. - Pulmonic valve: No evidence of vegetation.  ASSESSMENT AND PLAN:  1. Endocarditis with bacteremia MSSA, due to osteomyelitis  There was a   small vegetation on the atrial aspect of the base of the  posterior leaflet; the appearance is consistent with vegetation.  To receive 6 weeks of ABX. Taking him to 01/27/17.  Pt to leave rehab on Wed.  will check with Dr. Acie Fredrickson if follow up TEE  if needed.  May need ID input.   Follow up with Dr. Angelena Form in 4 months.   2.  ESRD on HD MWF.   3.   Hx spina bifida   4.  Osteomyelitis of Lt Gt toe with amputation followed by Dr. Sharol Given.     Current medicines are reviewed with the patient today.  The patient Has no concerns regarding medicines.  The following changes have been made:  See above Labs/ tests ordered today include:see above  Disposition:   FU:  see above  Signed, Cecilie Kicks, NP  01/11/2018 2:15 PM    [facility] [place], [place], [place], [place] Phone: [phone]; Fax: [phone]

## 2018-01-11 NOTE — Patient Instructions (Signed)
Your physician recommends that you continue on your current medications as directed. Please refer to the Current Medication list given to you today.   Your physician recommends that you schedule a follow-up appointment in:  [place]

## 2018-01-12 ENCOUNTER — Encounter (INDEPENDENT_AMBULATORY_CARE_PROVIDER_SITE_OTHER): Payer: Self-pay | Admitting: Physician Assistant

## 2018-01-14 ENCOUNTER — Encounter: Payer: Self-pay | Admitting: Internal Medicine

## 2018-01-14 ENCOUNTER — Non-Acute Institutional Stay (SKILLED_NURSING_FACILITY): Payer: Medicare Other | Admitting: Internal Medicine

## 2018-01-14 DIAGNOSIS — N186 End stage renal disease: Secondary | ICD-10-CM

## 2018-01-14 DIAGNOSIS — R109 Unspecified abdominal pain: Secondary | ICD-10-CM

## 2018-01-14 DIAGNOSIS — B9561 Methicillin susceptible Staphylococcus aureus infection as the cause of diseases classified elsewhere: Secondary | ICD-10-CM

## 2018-01-14 DIAGNOSIS — I739 Peripheral vascular disease, unspecified: Secondary | ICD-10-CM

## 2018-01-14 DIAGNOSIS — D631 Anemia in chronic kidney disease: Secondary | ICD-10-CM

## 2018-01-14 DIAGNOSIS — I059 Rheumatic mitral valve disease, unspecified: Secondary | ICD-10-CM

## 2018-01-14 DIAGNOSIS — R7881 Bacteremia: Secondary | ICD-10-CM | POA: Diagnosis not present

## 2018-01-14 DIAGNOSIS — E034 Atrophy of thyroid (acquired): Secondary | ICD-10-CM

## 2018-01-14 DIAGNOSIS — I058 Other rheumatic mitral valve diseases: Secondary | ICD-10-CM

## 2018-01-14 DIAGNOSIS — Z992 Dependence on renal dialysis: Secondary | ICD-10-CM

## 2018-01-14 DIAGNOSIS — T829XXD Unspecified complication of cardiac and vascular prosthetic device, implant and graft, subsequent encounter: Secondary | ICD-10-CM

## 2018-01-14 NOTE — Progress Notes (Signed)
Location:  [place] Room Number: 568S Place of Service:  SNF (31)  [name]. Sheppard Coil, MD  Patient Care Team: [name], MD as PCP - General (Family Medicine) Burnell Blanks, MD as PCP - Cardiology (Cardiology) Fleet Contras, MD as Consulting Physician (Nephrology)  Extended Emergency Contact Information Primary Emergency Contact: Lula Olszewski [place] Phone: [phone] Relation: Sister Secondary Emergency Contact: Queen [place] Phone: [phone] Relation: Niece  Allergies  Allergen Reactions  . Furadantin [Nitrofurantoin] Other (See Comments)    UNSPECIFIED REACTION   . Mandelamine [Methenamine] Other (See Comments)    UNSPECIFIED REACTION   . Noroxin [Norfloxacin] Other (See Comments)    UNSPECIFIED REACTION   . Carmine Nausea Only  . Contrast Media [Iodinated Diagnostic Agents] Nausea And Vomiting    Oral dye causes vomiting, IV dye is okay  . Hydrocodone Other (See Comments)    Caused involuntary movement and twitching. CAN NOT TAKE DUE TO MUSCLE SPASMS AND MUSCLE TREMORS  . Metrizamide Nausea And Vomiting    Oral dye causes vomiting, IV dye is okay  . Sulfa Antibiotics Cough    Childhood reaction - pt could not confirm that it was a cough  . Sulfur Cough    Childhood reaction - pt could not confirm that it was a cough    Chief Complaint  Patient presents with  . Discharge Note    Discharge from [facility]    HPI:  53 y.o. male with anemia, anxiety, constipation, end-stage renal disease on dialysis Monday Wednesday Friday, GERD, history of grand mal seizure, urolithiasis, hypertension, hypothyroidism, insomnia, history of remote migraine headaches, history of pneumonia x2, history of spina bifida who presented to [facility] with a one-week history of not feeling well and a fever of 102.  Patient reported a chronic cough and was complaining  of left great toe pain and redness that have been getting worse.  He was in dialysis when he was found to be febrile at 1 and 1.5 and he was started on vancomycin and Fortaz.  He also complained of chronic wound from prior nephrostomy site and reported bleeding from the site.  Patient was admitted to [facility] from 12/6-16 where he was ultimately treated for sepsis secondary to left toe osteomyelitis and MSSA bacteremia as well as endocarditis.  Orthopedic surgery was consulted and patient is now status post left first toe amputation.  Patient will need 6 weeks of IV Ancef with hemodialysis for endocarditis and osteomyelitis per ID.  Course was complicated by vascular access which was resolved but ultimately patient did return to the hospital to have his vascular assess worked on with success.  Patient also had hyperkalemia which was treated and chronic hypotension treated with Midodrine patient refuses.  Patient was admitted to skilled nursing facility for OT/PT and for 6 weeks of IV antibiotics and is now ready to be discharged to home.    Past Medical History:  Diagnosis Date  . Anemia   . Anxiety   . Constipation   . End stage renal failure on dialysis [facility])    "[place]; MWF" (02/29/2016)  . GERD (gastroesophageal reflux disease)   . Grand mal seizure ([place]) X 4   last 1998 (02/29/2016)  . Heart murmur    no problems with per pt. - nephrologist and Dr. Coletta Memos  . History of blood transfusion 2000s   "I was having blood loss; never  found out from where"  . History of kidney stones   . Hypertension    hx of - has not taken bp meds in over 2 years  . Hypothyroidism   . Insomnia   . Migraine    "due to my BP in my late 1990s; none since" (02/29/2016)  . Nonhealing surgical wound    of the left arteriovenous graft  . Pneumonia    x 2  . Shortness of breath    rarely  . Spina bifida [facility])     Past Surgical History:  Procedure Laterality Date  . AMPUTATION Left 12/11/2017    Procedure: Left Great Toe Amputation;  Surgeon: [name], MD;  Location: [place];  Service: Orthopedics;  Laterality: Left;  . APPENDECTOMY    . ARTERIOVENOUS GRAFT PLACEMENT Left 10/16/2012   left femoral goretex graft         Dr Donnetta Hutching  . AV FISTULA PLACEMENT Left 06/27/2012   Procedure: EXPLORATORY LEFT THY-GRAFT PSEUDO-ANEURYSM;  Surgeon: Conrad Marshallberg, MD;  Location: [place];  Service: Vascular;  Laterality: Left;  Revision of left Arteriovenus gortex graft in thigh.  . COLONOSCOPY    . I&D EXTREMITY Left 02/29/2016   Procedure: DEBRIDEMENT LEFT THIGH WOUND;  Surgeon: Waynetta Sandy, MD;  Location: [place];  Service: Vascular;  Laterality: Left;  . ILEOSTOMY  1970s  . INCISION AND DRAINAGE Left 10/16/2012   Procedure: INCISION AND Debridement left thigh graft;  Surgeon: Rosetta Posner, MD;  Location: [place];  Service: Vascular;  Laterality: Left;  . INSERTION OF DIALYSIS CATHETER    . INSERTION OF DIALYSIS CATHETER  01/14/2016   Procedure: INSERTION OF DIALYSIS CATHETER;  Surgeon: Waynetta Sandy, MD;  Location: [place];  Service: Vascular;;  . INSERTION OF DIALYSIS CATHETER Right 08/07/2017   Procedure: INSERTION OF DIALYSIS CATHETER;  Surgeon: Waynetta Sandy, MD;  Location: [place];  Service: Vascular;  Laterality: Right;  . INSERTION OF DIALYSIS CATHETER Right 12/21/2017   Procedure: RIGHT FEMORAL DIALYSIS CATHETER EXCHANGE;  Surgeon: Marty Heck, MD;  Location: [place];  Service: Vascular;  Laterality: Right;  . INSERTION OF ILIAC STENT Left 01/14/2016   Procedure: INSERTION OF ILIAC STENT;  Surgeon: Waynetta Sandy, MD;  Location: [place];  Service: Vascular;  Laterality: Left;  . INTRAOPERATIVE ARTERIOGRAM Left 01/14/2016   Procedure: INTRA OPERATIVE ARTERIOGRAM;  Surgeon: Waynetta Sandy, MD;  Location: [place];  Service: Vascular;  Laterality: Left;  . LITHOTRIPSY  x 3   "& a laser treatment" (02/29/2016)  . NEPHROSTOMY Bilateral 1968  .  PATELLAR TENDON REPAIR Right 1990s   "big incision"  . PERIPHERAL VASCULAR CATHETERIZATION  01/14/2016   Procedure: A/V SHUNTOGRAM;  Surgeon: Waynetta Sandy, MD;  Location: [place];  Service: Vascular;;  . REVISION OF ARTERIOVENOUS GORETEX GRAFT Left 10/16/2012   Procedure: REVISION OF LEFT FEMORAL LOOP ARTERIOVENOUS GORETEX GRAFT;  Surgeon: Rosetta Posner, MD;  Location: [place];  Service: Vascular;  Laterality: Left;  . REVISION OF ARTERIOVENOUS GORETEX GRAFT Left 02/11/2015   Procedure: EXCISION OF SMALL SEGMENT OF EXPOSED LEFT THIGH NON FUNCTIONING  ARTERIOVENOUS GORETEX GRAFT;  Surgeon: Mal Misty, MD;  Location: [place];  Service: Vascular;  Laterality: Left;  . REVISION OF ARTERIOVENOUS GORETEX GRAFT Left 01/14/2016   Procedure: REVISION OF ARTERIOVENOUS GORETEX GRAFT;  Surgeon: Waynetta Sandy, MD;  Location: [place];  Service: Vascular;  Laterality: Left;  . REVISION OF ARTERIOVENOUS GORETEX GRAFT Left 02/29/2016  thigh/pt report  . REVISION OF ARTERIOVENOUS GORETEX GRAFT Left 02/29/2016   Procedure: POSSIBLE REVISION OF LEFT THIGH ARTERIOVENOUS GORETEX GRAFT;  Surgeon: Waynetta Sandy, MD;  Location: [place];  Service: Vascular;  Laterality: Left;  . TEE WITHOUT CARDIOVERSION N/A 12/14/2017   Procedure: TRANSESOPHAGEAL ECHOCARDIOGRAM (TEE);  Surgeon: Acie Fredrickson Wonda Cheng, MD;  Location: [facility] ENDOSCOPY;  Service: Cardiovascular;  Laterality: N/A;  . THROMBECTOMY W/ EMBOLECTOMY Left 01/14/2016   Procedure: THROMBECTOMY ARTERIOVENOUS GORE-TEX Left thigh GRAFT;  Surgeon: Waynetta Sandy, MD;  Location: [place];  Service: Vascular;  Laterality: Left;  . TONGUE SURGERY  ~ 1990   tongue-tie release   . ULTRASOUND GUIDANCE FOR VASCULAR ACCESS Right 08/07/2017   Procedure: ULTRASOUND GUIDANCE FOR VASCULAR CANNULATION RIGHT FEMORAL VEIN AND LEFT AV FEMORAL GRAFT;  Surgeon: Waynetta Sandy, MD;  Location: [place];  Service: Vascular;  Laterality: Right;  . UPPER EXTREMITY  VENOGRAPHY Bilateral 08/09/2017   Procedure: UPPER EXTREMITY VENOGRAPHY;  Surgeon: Serafina Mitchell, MD;  Location: [place] CV LAB;  Service: Cardiovascular;  Laterality: Bilateral;  . VENOGRAM N/A 09/20/2017   Procedure: RIGHT COMMON FEMORAL ARTERY EXPLORATION.  CANNULATION RIGHT COMMON FEMORAL VEIN. VENOGRAM CENTRAL ULTRA SOUND GUIDED RIGHT FEMORAL VEIN TIMES TWO.;  Surgeon: Waynetta Sandy, MD;  Location: [place];  Service: Vascular;  Laterality: N/A;  . WOUND DEBRIDEMENT Left 02/29/2016   thigh     reports that he has never smoked. He has never used smokeless tobacco. He reports previous alcohol use. He reports previous drug use. Drug: Marijuana. Social History   Socioeconomic History  . Marital status: Single    Spouse name: Not on file  . Number of children: 0  . Years of education: Not on file  . Highest education level: Not on file  Occupational History  . Occupation: Disabled  Social Needs  . Financial resource strain: Not on file  . Food insecurity:    Worry: Not on file    Inability: Not on file  . Transportation needs:    Medical: Not on file    Non-medical: Not on file  Tobacco Use  . Smoking status: Never Smoker  . Smokeless tobacco: Never Used  Substance and Sexual Activity  . Alcohol use: Not Currently    Alcohol/week: 0.0 standard drinks    Comment: 02/29/2016 "nothing since  the mid 1990s  . Drug use: Not Currently    Types: Marijuana    Comment: 02/29/2016 "nothing since ~ 1995"  . Sexual activity: Never  Lifestyle  . Physical activity:    Days per week: Not on file    Minutes per session: Not on file  . Stress: Not on file  Relationships  . Social connections:    Talks on phone: Not on file    Gets together: Not on file    Attends religious service: Not on file    Active member of club or organization: Not on file    Attends meetings of clubs or organizations: Not on file    Relationship status: Not on file  . Intimate partner violence:     Fear of current or ex partner: Not on file    Emotionally abused: Not on file    Physically abused: Not on file    Forced sexual activity: Not on file  Other Topics Concern  . Not on file  Social History Narrative  . Not on file    Pertinent  Health Maintenance Due  Topic Date Due  . COLONOSCOPY  10/14/2015  . INFLUENZA VACCINE  08/02/2017    Medications: Allergies as of 01/14/2018      Reactions   Furadantin [nitrofurantoin] Other (See Comments)   UNSPECIFIED REACTION    Mandelamine [methenamine] Other (See Comments)   UNSPECIFIED REACTION    Noroxin [norfloxacin] Other (See Comments)   UNSPECIFIED REACTION    Carmine Nausea Only   Contrast Media [iodinated Diagnostic Agents] Nausea And Vomiting   Oral dye causes vomiting, IV dye is okay   Hydrocodone Other (See Comments)   Caused involuntary movement and twitching. CAN NOT TAKE DUE TO MUSCLE SPASMS AND MUSCLE TREMORS   Metrizamide Nausea And Vomiting   Oral dye causes vomiting, IV dye is okay   Sulfa Antibiotics Cough   Childhood reaction - pt could not confirm that it was a cough   Sulfur Cough   Childhood reaction - pt could not confirm that it was a cough      Medication List       Accurate as of January 14, 2018 11:59 PM. Always use your most recent med list.        aspirin 325 MG EC tablet Take 325 mg by mouth daily.   calcium acetate 667 MG capsule Commonly known as:  PHOSLO Take 3-4 capsules (2,001-2,668 mg total) by mouth See admin instructions. Take 2708 mg by mouth 3 times daily with meals, take 2001 mg by mouth with snacks   ceFAZolin  IVPB Commonly known as:  ANCEF Inject 2 g into the vein every Monday, Wednesday, and Friday with hemodialysis. Indication:  MSSA Bacteremia Last Day of Therapy:  01/19/2018 Labs - Once weekly:  CBC/D and BMP, Labs - Every other week:  ESR and CRP   clopidogrel 75 MG tablet Commonly known as:  PLAVIX Take 1 tablet (75 mg total) by mouth daily.   famotidine 40 MG  tablet Commonly known as:  PEPCID Take 1 tablet (40 mg total) by mouth daily at 2 PM.   fluticasone 50 MCG/ACT nasal spray Commonly known as:  FLONASE Place 2 sprays into both nostrils as needed for allergies or rhinitis (congestion).   levothyroxine 125 MCG tablet Commonly known as:  SYNTHROID, LEVOTHROID Take 1 tablet (125 mcg total) by mouth daily before breakfast.   methocarbamol 500 MG tablet Commonly known as:  ROBAXIN Take 1 tablet (500 mg total) by mouth every 6 (six) hours as needed for muscle spasms.   midodrine 5 MG tablet Commonly known as:  PROAMATINE Take 1 tablet (5 mg total) by mouth 3 (three) times daily with meals.   NEPRO PO Take 1 Can by mouth daily.   oxyCODONE-acetaminophen 10-325 MG tablet Commonly known as:  PERCOCET Take 1 tablet by mouth 3 (three) times daily.   Polyethylene Glycol 3350 Powd Take 17 g by mouth daily as needed. [place] 1 CAPFUL (17GM) IN 4-8OZ. OF WATER AND DRINK BY MOUTH DAILY AS NEEDED FOR MILD CONSTIPATION   promethazine 25 MG tablet Commonly known as:  PHENERGAN Take 1 tablet (25 mg total) by mouth 2 (two) times daily as needed for nausea or vomiting.   sodium polystyrene powder Commonly known as:  KAYEXALATE Take 15 g by mouth See admin instructions. Take 15 g (mixed in water) by mouth on Saturdays and Sundays   sodium zirconium cyclosilicate 10 g Pack packet Commonly known as:  LOKELMA Take 10 g by mouth daily. TAKE 10GM PACK ON OFF DIALYSIS DAYS ON TUESDAY ON THURSDAY ON SUNDAY   traZODone 100 MG tablet Commonly  known as:  DESYREL Take 1 tablet (100 mg total) by mouth at bedtime.   zolpidem 10 MG tablet Commonly known as:  AMBIEN Take 1 tablet (10 mg total) by mouth at bedtime.        Vitals:   01/14/18 1006  BP: 101/60  Pulse: 62  Resp: 20  Temp: (!) 97.5 F (36.4 C)  Weight: 267 lb (121.1 kg)  Height: _0  (1.803 m)   Body mass index is 37.24 kg/m.  Physical Exam  GENERAL APPEARANCE: Alert,  conversant. No acute distress.  HEENT: Unremarkable. RESPIRATORY: Breathing is even, unlabored. Lung sounds are clear   CARDIOVASCULAR: Heart RRR no murmurs, rubs or gallops. No peripheral edema.  GASTROINTESTINAL: Abdomen is soft, non-tender, not distended w/ normal bowel sounds.  NEUROLOGIC: Cranial nerves 2-12 grossly intact. Moves all extremities   Labs reviewed: Basic Metabolic Panel: Recent Labs    12/14/17 0520 12/15/17 0519 12/17/17 0437 12/21/17 1304 12/22/17 0526 12/23/17 0419 01/10/18  NA 136 135 133* 134* 133* 132* 138  K 5.0 4.8 5.9* 5.7* 6.2* 5.9* 5.2  CL 97* 96* 96* 95* 95* 96*  --   CO2 25 25 21* 23 21* 21*  --   GLUCOSE 77 75 91 89 78 71  --   BUN 62* 43* 77* 74* 81* 65* 52*  CREATININE 11.43* 9.11* 13.48* 12.84* 14.21* 12.43* 8.6*  CALCIUM 8.3* 8.5* 8.6* 9.8 9.2 9.1  --   PHOS 5.5* 5.6* 5.7*  --   --   --   --    No results found for: [facility] Liver Function Tests: Recent Labs    08/08/17 0337  12/07/17 1115  12/15/17 0519 12/17/17 0437 12/22/17 0526  AST 12*  --  19  --   --   --  10*  ALT 9  --  14  --   --   --  <5  ALKPHOS 87  --  56  --   --   --  54  BILITOT 0.8  --  1.1  --   --   --  0.6  PROT 7.2  --  8.3*  --   --   --  8.5*  ALBUMIN 3.2*   < > 3.0*   < > 2.6* 2.5* 2.7*   < > = values in this interval not displayed.   No results for input(s): LIPASE, AMYLASE in the last 8760 hours. No results for input(s): AMMONIA in the last 8760 hours. CBC: Recent Labs    08/14/17 0111  12/07/17 1115  12/21/17 1304 12/22/17 0526 12/23/17 0653 12/27/17 01/10/18  WBC 6.5   < > 8.1   < > 5.5 4.4 4.4 4.8 5.0  NEUTROABS 5.0  --  7.4  --   --  3.2  --   --   --   HGB 8.9*   < > 10.9*   < > 9.6* 9.5* 9.1* 9.8* 9.9*  HCT 28.9*   < > 36.0*   < > 32.9* 32.3* 30.5* 30* 30*  MCV 98.6   < > 96.3   < > 98.2 99.4 99.0  --   --   PLT 135*   < > 92*   < > 146* 134* 126* 132* 121*   < > = values in this interval not displayed.   Lipid No results for  input(s): CHOL, HDL, LDLCALC, TRIG in the last 8760 hours. Cardiac Enzymes: Recent Labs    12/07/17 1243  TROPONINI 0.04*   BNP:  No results for input(s): BNP in the last 8760 hours. CBG: Recent Labs    12/21/17 1520  GLUCAP 89    Procedures and Imaging Studies During Stay: No results found.  Assessment/Plan:   Endocarditis of mitral valve  MSSA bacteremia  ESRD (end stage renal disease) (HCC)  PAD (peripheral artery disease) (HCC)  Hypothyroidism due to acquired atrophy of thyroid  Complication of arteriovenous dialysis fistula, subsequent encounter  Right flank pain  Anemia due to chronic kidney disease, on chronic dialysis [facility])   Patient is being discharged with the following home health services: OT/PT/nursing  Patient is being discharged with the following durable medical equipment: Standard wheelchair-with 20 inches- and semi-electric hospital bed  Patient has been advised to f/u with their PCP in 1-2 weeks to bring them up to date on their rehab stay.  Social services at facility was responsible for arranging this appointment.  Pt was provided with a 30 day supply of prescriptions for medications and refills must be obtained from their PCP.  For controlled substances, a more limited supply may be provided adequate until PCP appointment only.  Time spent greater than 30 minutes;> 50% of time with patient was spent reviewing records, labs, tests and studies, counseling and developing plan of care   [name]. Sheppard Coil, MD

## 2018-01-15 MED ORDER — CLOPIDOGREL BISULFATE 75 MG PO TABS: 75.0000 mg | ORAL_TABLET | Freq: Every day | ORAL | 0 refills | Status: DC

## 2018-01-15 MED ORDER — CALCIUM ACETATE 667 MG PO CAPS: 2001.0000 mg | ORAL_CAPSULE | ORAL | 0 refills | Status: DC

## 2018-01-15 MED ORDER — ZOLPIDEM TARTRATE 10 MG PO TABS: 10.0000 mg | ORAL_TABLET | Freq: Every day | ORAL | 0 refills | Status: DC

## 2018-01-15 MED ORDER — TRAZODONE HCL 100 MG PO TABS: 100.0000 mg | ORAL_TABLET | Freq: Every day | ORAL | 0 refills | Status: DC

## 2018-01-15 MED ORDER — PROMETHAZINE HCL 25 MG PO TABS: 25.0000 mg | ORAL_TABLET | Freq: Two times a day (BID) | ORAL | 0 refills | Status: DC | PRN

## 2018-01-15 MED ORDER — METHOCARBAMOL 500 MG PO TABS: 500.0000 mg | ORAL_TABLET | Freq: Four times a day (QID) | ORAL | 0 refills | Status: DC | PRN

## 2018-01-15 MED ORDER — MIDODRINE HCL 5 MG PO TABS: 5.0000 mg | ORAL_TABLET | Freq: Three times a day (TID) | ORAL | 0 refills | Status: DC

## 2018-01-15 MED ORDER — SODIUM POLYSTYRENE SULFONATE PO POWD: 15.0000 g | ORAL | 0 refills | Status: DC

## 2018-01-15 MED ORDER — LEVOTHYROXINE SODIUM 125 MCG PO TABS: 125.0000 ug | ORAL_TABLET | Freq: Every day | ORAL | 0 refills | Status: DC

## 2018-01-15 MED ORDER — FLUTICASONE PROPIONATE 50 MCG/ACT NA SUSP: 2.0000 | NASAL | 0 refills | Status: DC | PRN

## 2018-01-15 MED ORDER — SODIUM ZIRCONIUM CYCLOSILICATE 10 G PO PACK: 10.0000 g | PACK | Freq: Every day | ORAL | 0 refills | Status: DC

## 2018-01-15 MED ORDER — OXYCODONE-ACETAMINOPHEN 10-325 MG PO TABS: 1.0000 | ORAL_TABLET | Freq: Three times a day (TID) | ORAL | 0 refills | Status: DC

## 2018-01-15 MED ORDER — FAMOTIDINE 40 MG PO TABS: 40.0000 mg | ORAL_TABLET | Freq: Every day | ORAL | 0 refills | Status: DC

## 2018-01-15 MED ORDER — POLYETHYLENE GLYCOL 3350 POWD: 17.0000 g | Freq: Every day | 0 refills | Status: DC | PRN

## 2018-01-17 ENCOUNTER — Telehealth: Payer: Self-pay

## 2018-01-17 LAB — CBC AND DIFFERENTIAL
HCT: 32 — AB (ref 41–53)
Hemoglobin: 10.5 — AB (ref 13.5–17.5)
Platelets: 127 — AB (ref 150–399)
WBC: 4.1

## 2018-01-17 LAB — BASIC METABOLIC PANEL
BUN: 67 — AB (ref 4–21)
Creatinine: 9.9 — AB (ref 0.6–1.3)
Glucose: 73
Potassium: 5.9 — AB (ref 3.4–5.3)
Sodium: 135 — AB (ref 137–147)

## 2018-01-17 LAB — POCT ERYTHROCYTE SEDIMENTATION RATE, NON-AUTOMATED: Sed Rate: 59

## 2018-01-17 NOTE — Telephone Encounter (Signed)
Called patient to set up [place] appointment as requested. Patient politely declined to set up follow up appointment at this time  due to being in rehab at [facility] and currently going through a discharge appeal process. Offered patient office phone number to contact ID clinic when he is ready to set up appointment but patient also declined information.  S.Cordale Manera,LPN

## 2018-01-17 NOTE — Telephone Encounter (Signed)
Thank you for trying to offer appointment. We need to make sure that his PICC line is properly removed after antibiotic therapy.  Would you be able to call [name] to discuss? Looks like he is due to stop 01/19/18. He also needs follow up with cardiology team to watch valve function after infection.

## 2018-01-17 NOTE — Telephone Encounter (Signed)
Spoke with M.Cruz RN at [facility] and gave verbal orders per Janene Madeira, NP to stop antibiotic therapy on 01/19/2018 and patient also needs to be followed by cardiology team to watch valve function after infection.  RN states patient receives antibiotic therapy at [facility]) and she will relay message the HD center in regards to antibiotic therapy stop date. This LPN also spoke with Dr. Inocencio Homes to make  aware of patient's decision not to follow up with ID at this time and  current ID plans for antibiotics to be stopped on 01/19/18 and recommendations for cardiology follow up. MD was appreciative of call.  S.Donalee Gaumond, LPN

## 2018-01-17 NOTE — Telephone Encounter (Signed)
-----   Message from [place] Callas, NP sent at 01/17/2018 11:44 AM EST ----- Cathie Beams would you be able to contact [name] for an appointment next week with Dr. Hatcher/Comer or Marya Amsler please for hospital follow up endocarditis (30 min if with Marya Amsler please). His appointment prior to discharge in December was missed being set up.   Thank you!

## 2018-01-21 ENCOUNTER — Encounter: Payer: Self-pay | Admitting: Internal Medicine

## 2018-01-21 ENCOUNTER — Telehealth (INDEPENDENT_AMBULATORY_CARE_PROVIDER_SITE_OTHER): Payer: Self-pay | Admitting: Orthopedic Surgery

## 2018-01-21 NOTE — Telephone Encounter (Signed)
Patient called advised he is at [facility] and they are discharging him tomorrow. Patient said he need more time and asked if Dr Sharol Given can help extend his stay at least until Friday. The number to contact patient is [phone]

## 2018-01-21 NOTE — Progress Notes (Signed)
Location:  [place] Room Number: 622W Place of Service:  SNF (31)  Darrell Delaine. Sheppard Coil, MD  Patient Care Team: [name], MD as PCP - General (Family Medicine) Burnell Blanks, MD as PCP - Cardiology (Cardiology) Fleet Contras, MD as Consulting Physician (Nephrology)  Extended Emergency Contact Information Primary Emergency Contact: Lula Olszewski [place] of [place] Phone: [phone] Relation: Sister Secondary Emergency Contact: Queen [place] of [place] Phone: [phone] Relation: Niece    Allergies: Furadantin [nitrofurantoin]; Mandelamine [methenamine]; Noroxin [norfloxacin]; Carmine; Contrast media [iodinated diagnostic agents]; Hydrocodone; Metrizamide; Sulfa antibiotics; and Sulfur  Chief Complaint  Patient presents with  . Medical Management of Chronic Issues    Routine Visit    HPI: Patient is 53 y.o. male who   Past Medical History:  Diagnosis Date  . Anemia   . Anxiety   . Constipation   . End stage renal failure on dialysis [facility])    "[place]; MWF" (02/29/2016)  . GERD (gastroesophageal reflux disease)   . Grand mal seizure ([place]) X 4   last 1998 (02/29/2016)  . Heart murmur    no problems with per pt. - nephrologist and Dr. Coletta Memos  . History of blood transfusion 2000s   "I was having blood loss; never found out from where"  . History of kidney stones   . Hypertension    hx of - has not taken bp meds in over 2 years  . Hypothyroidism   . Insomnia   . Migraine    "due to my BP in my late 1990s; none since" (02/29/2016)  . Nonhealing surgical wound    of the left arteriovenous graft  . Pneumonia    x 2  . Shortness of breath    rarely  . Spina bifida [facility])     Past Surgical History:  Procedure Laterality Date  . AMPUTATION Left 12/11/2017   Procedure: Left Great Toe Amputation;  Surgeon: [name], MD;  Location: [place];  Service: Orthopedics;   Laterality: Left;  . APPENDECTOMY    . ARTERIOVENOUS GRAFT PLACEMENT Left 10/16/2012   left femoral goretex graft         Dr Donnetta Hutching  . AV FISTULA PLACEMENT Left 06/27/2012   Procedure: EXPLORATORY LEFT THY-GRAFT PSEUDO-ANEURYSM;  Surgeon: Conrad Montello, MD;  Location: [place];  Service: Vascular;  Laterality: Left;  Revision of left Arteriovenus gortex graft in thigh.  . COLONOSCOPY    . I&D EXTREMITY Left 02/29/2016   Procedure: DEBRIDEMENT LEFT THIGH WOUND;  Surgeon: Waynetta Sandy, MD;  Location: [place];  Service: Vascular;  Laterality: Left;  . ILEOSTOMY  1970s  . INCISION AND DRAINAGE Left 10/16/2012   Procedure: INCISION AND Debridement left thigh graft;  Surgeon: Rosetta Posner, MD;  Location: [place];  Service: Vascular;  Laterality: Left;  . INSERTION OF DIALYSIS CATHETER    . INSERTION OF DIALYSIS CATHETER  01/14/2016   Procedure: INSERTION OF DIALYSIS CATHETER;  Surgeon: Waynetta Sandy, MD;  Location: [place];  Service: Vascular;;  . INSERTION OF DIALYSIS CATHETER Right 08/07/2017   Procedure: INSERTION OF DIALYSIS CATHETER;  Surgeon: Waynetta Sandy, MD;  Location: [place];  Service: Vascular;  Laterality: Right;  . INSERTION OF DIALYSIS CATHETER Right 12/21/2017   Procedure: RIGHT FEMORAL DIALYSIS CATHETER EXCHANGE;  Surgeon: Marty Heck, MD;  Location: [place];  Service: Vascular;  Laterality: Right;  . INSERTION OF ILIAC STENT Left 01/14/2016  Procedure: INSERTION OF ILIAC STENT;  Surgeon: Waynetta Sandy, MD;  Location: [place];  Service: Vascular;  Laterality: Left;  . INTRAOPERATIVE ARTERIOGRAM Left 01/14/2016   Procedure: INTRA OPERATIVE ARTERIOGRAM;  Surgeon: Waynetta Sandy, MD;  Location: [place];  Service: Vascular;  Laterality: Left;  . LITHOTRIPSY  x 3   "& a laser treatment" (02/29/2016)  . NEPHROSTOMY Bilateral 1968  . PATELLAR TENDON REPAIR Right 1990s   "big incision"  . PERIPHERAL VASCULAR CATHETERIZATION  01/14/2016    Procedure: A/V SHUNTOGRAM;  Surgeon: Waynetta Sandy, MD;  Location: [place];  Service: Vascular;;  . REVISION OF ARTERIOVENOUS GORETEX GRAFT Left 10/16/2012   Procedure: REVISION OF LEFT FEMORAL LOOP ARTERIOVENOUS GORETEX GRAFT;  Surgeon: Rosetta Posner, MD;  Location: [place];  Service: Vascular;  Laterality: Left;  . REVISION OF ARTERIOVENOUS GORETEX GRAFT Left 02/11/2015   Procedure: EXCISION OF SMALL SEGMENT OF EXPOSED LEFT THIGH NON FUNCTIONING  ARTERIOVENOUS GORETEX GRAFT;  Surgeon: Mal Misty, MD;  Location: [place];  Service: Vascular;  Laterality: Left;  . REVISION OF ARTERIOVENOUS GORETEX GRAFT Left 01/14/2016   Procedure: REVISION OF ARTERIOVENOUS GORETEX GRAFT;  Surgeon: Waynetta Sandy, MD;  Location: [place];  Service: Vascular;  Laterality: Left;  . REVISION OF ARTERIOVENOUS GORETEX GRAFT Left 02/29/2016   thigh/pt report  . REVISION OF ARTERIOVENOUS GORETEX GRAFT Left 02/29/2016   Procedure: POSSIBLE REVISION OF LEFT THIGH ARTERIOVENOUS GORETEX GRAFT;  Surgeon: Waynetta Sandy, MD;  [place];  Service: Vascular;  Laterality: Left;  . TEE WITHOUT CARDIOVERSION N/A 12/14/2017   Procedure: TRANSESOPHAGEAL ECHOCARDIOGRAM (TEE);  Surgeon: Acie Fredrickson Wonda Cheng, MD;  Location: [facility] ENDOSCOPY;  Service: Cardiovascular;  Laterality: N/A;  . THROMBECTOMY W/ EMBOLECTOMY Left 01/14/2016   Procedure: THROMBECTOMY ARTERIOVENOUS GORE-TEX Left thigh GRAFT;  Surgeon: Waynetta Sandy, MD;  Location: [place];  Service: Vascular;  Laterality: Left;  . TONGUE SURGERY  ~ 1990   tongue-tie release   . ULTRASOUND GUIDANCE FOR VASCULAR ACCESS Right 08/07/2017   Procedure: ULTRASOUND GUIDANCE FOR VASCULAR CANNULATION RIGHT FEMORAL VEIN AND LEFT AV FEMORAL GRAFT;  Surgeon: Waynetta Sandy, MD;  Location: [place];  Service: Vascular;  Laterality: Right;  . UPPER EXTREMITY VENOGRAPHY Bilateral 08/09/2017   Procedure: UPPER EXTREMITY VENOGRAPHY;  Surgeon: Serafina Mitchell, MD;   Location: [place] CV LAB;  Service: Cardiovascular;  Laterality: Bilateral;  . VENOGRAM N/A 09/20/2017   Procedure: RIGHT COMMON FEMORAL ARTERY EXPLORATION.  CANNULATION RIGHT COMMON FEMORAL VEIN. VENOGRAM CENTRAL ULTRA SOUND GUIDED RIGHT FEMORAL VEIN TIMES TWO.;  Surgeon: Waynetta Sandy, MD;  Location: [facility] OR;  Service: Vascular;  Laterality: N/A;  . WOUND DEBRIDEMENT Left 02/29/2016   thigh    Allergies as of 01/21/2018      Reactions   Furadantin [nitrofurantoin] Other (See Comments)   UNSPECIFIED REACTION    Mandelamine [methenamine] Other (See Comments)   UNSPECIFIED REACTION    Noroxin [norfloxacin] Other (See Comments)   UNSPECIFIED REACTION    Carmine Nausea Only   Contrast Media [iodinated Diagnostic Agents] Nausea And Vomiting   Oral dye causes vomiting, IV dye is okay   Hydrocodone Other (See Comments)   Caused involuntary movement and twitching. CAN NOT TAKE DUE TO MUSCLE SPASMS AND MUSCLE TREMORS   Metrizamide Nausea And Vomiting   Oral dye causes vomiting, IV dye is okay   Sulfa Antibiotics Cough   Childhood reaction - pt could not confirm that it was a cough   Sulfur Cough  Childhood reaction - pt could not confirm that it was a cough      Medication List       Accurate as of January 21, 2018  1:39 PM. Always use your most recent med list.        aspirin 325 MG EC tablet Take 325 mg by mouth daily.   calcium acetate 667 MG capsule Commonly known as:  PHOSLO Take 3-4 capsules (2,001-2,668 mg total) by mouth See admin instructions. Take 2708 mg by mouth 3 times daily with meals, take 2001 mg by mouth with snacks   ceFAZolin  IVPB Commonly known as:  ANCEF Inject 2 g into the vein every Monday, Wednesday, and Friday with hemodialysis. Indication:  MSSA Bacteremia Last Day of Therapy:  01/19/2018 Labs - Once weekly:  CBC/D and BMP, Labs - Every other week:  ESR and CRP   clopidogrel 75 MG tablet Commonly known as:  PLAVIX Take 1 tablet (75 mg  total) by mouth daily.   famotidine 40 MG tablet Commonly known as:  PEPCID Take 1 tablet (40 mg total) by mouth daily at 2 PM.   fluticasone 50 MCG/ACT nasal spray Commonly known as:  FLONASE Place 2 sprays into both nostrils as needed for allergies or rhinitis (congestion).   levothyroxine 125 MCG tablet Commonly known as:  SYNTHROID, LEVOTHROID Take 1 tablet (125 mcg total) by mouth daily before breakfast.   methocarbamol 500 MG tablet Commonly known as:  ROBAXIN Take 1 tablet (500 mg total) by mouth every 6 (six) hours as needed for muscle spasms.   midodrine 5 MG tablet Commonly known as:  PROAMATINE Take 1 tablet (5 mg total) by mouth 3 (three) times daily with meals.   NEPRO PO Take 1 Can by mouth daily.   oxyCODONE-acetaminophen 10-325 MG tablet Commonly known as:  PERCOCET Take 1 tablet by mouth 3 (three) times daily.   Polyethylene Glycol 3350 Powd Take 17 g by mouth daily as needed. [place] 1 CAPFUL (17GM) IN 4-8OZ. OF WATER AND DRINK BY MOUTH DAILY AS NEEDED FOR MILD CONSTIPATION   promethazine 25 MG tablet Commonly known as:  PHENERGAN Take 1 tablet (25 mg total) by mouth 2 (two) times daily as needed for nausea or vomiting.   sodium polystyrene powder Commonly known as:  KAYEXALATE Take 15 g by mouth See admin instructions. Take 15 g (mixed in water) by mouth on Saturdays and Sundays   sodium zirconium cyclosilicate 10 g Pack packet Commonly known as:  LOKELMA Take 10 g by mouth daily. TAKE 10GM PACK ON OFF DIALYSIS DAYS ON TUESDAY ON THURSDAY ON SUNDAY   traZODone 100 MG tablet Commonly known as:  DESYREL Take 1 tablet (100 mg total) by mouth at bedtime.   zolpidem 10 MG tablet Commonly known as:  AMBIEN Take 1 tablet (10 mg total) by mouth at bedtime.       No orders of the defined types were placed in this encounter.   Immunization History  Administered Date(s) Administered  . Pneumococcal Polysaccharide-23 10/17/2012    Social History    Tobacco Use  . Smoking status: Never Smoker  . Smokeless tobacco: Never Used  Substance Use Topics  . Alcohol use: Not Currently    Alcohol/week: 0.0 standard drinks    Comment: 02/29/2016 "nothing since  the mid 1990s    Review of Systems  DATA OBTAINED: from patient, nurse, medical record, family member GENERAL:  no fevers, fatigue, appetite changes SKIN: No itching, rash HEENT: No complaint  RESPIRATORY: No cough, wheezing, SOB CARDIAC: No chest pain, palpitations, lower extremity edema  GI: No abdominal pain, No N/V/D or constipation, No heartburn or reflux  GU: No dysuria, frequency or urgency, or incontinence  MUSCULOSKELETAL: No unrelieved bone/joint pain NEUROLOGIC: No headache, dizziness  PSYCHIATRIC: No overt anxiety or sadness  Vitals:   01/21/18 1330  BP: 100/63  Pulse: 60  Resp: 18  Temp: 98.5 F (36.9 C)   Body mass index is 37.67 kg/m. Physical Exam  GENERAL APPEARANCE: Alert, conversant, No acute distress  SKIN: No diaphoresis rash HEENT: Unremarkable RESPIRATORY: Breathing is even, unlabored. Lung sounds are clear   CARDIOVASCULAR: Heart RRR no murmurs, rubs or gallops. No peripheral edema  GASTROINTESTINAL: Abdomen is soft, non-tender, not distended w/ normal bowel sounds.  GENITOURINARY: Bladder non tender, not distended  MUSCULOSKELETAL: No abnormal joints or musculature NEUROLOGIC: Cranial nerves 2-12 grossly intact. Moves all extremities PSYCHIATRIC: Mood and affect appropriate to situation, no behavioral issues  Patient Active Problem List   Diagnosis Date Noted  . PAD (peripheral artery disease) ([place]) 12/22/2017  . Problem with dialysis access ([place]) 12/21/2017  . Anemia 12/21/2017  . Hypothyroidism 12/21/2017  . GERD (gastroesophageal reflux disease) 12/21/2017  . Anxiety 12/21/2017  . Left great toe amputee ([place]) 12/19/2017  . Endocarditis of mitral valve   . Osteomyelitis of toe of left foot ([place])   . MSSA bacteremia 12/08/2017  .  Fever   . Right flank pain   . ESRD (end stage renal disease) ([place]) 09/20/2017  . ESRD (end stage renal disease) on dialysis ([place]) 08/07/2017  . Nonhealing surgical wound 02/29/2016  . Clotted dialysis access ([place]) 01/14/2016  . Removal of staples 07/19/2012  . Complication of AV dialysis fistula 06/27/2012  . End stage renal disease ([place]) 06/27/2012    CMP     Component Value Date/Time   NA 135 (A) 01/17/2018   K 5.9 (A) 01/17/2018   CL 96 (L) 12/23/2017 0419   CO2 21 (L) 12/23/2017 0419   GLUCOSE 71 12/23/2017 0419   BUN 67 (A) 01/17/2018   CREATININE 9.9 (A) 01/17/2018   CREATININE 12.43 (H) 12/23/2017 0419   CALCIUM 9.1 12/23/2017 0419   PROT 8.5 (H) 12/22/2017 0526   ALBUMIN 2.7 (L) 12/22/2017 0526   AST 10 (L) 12/22/2017 0526   ALT <5 12/22/2017 0526   ALKPHOS 54 12/22/2017 0526   BILITOT 0.6 12/22/2017 0526   GFRNONAA 4 (L) 12/23/2017 0419   GFRAA 5 (L) 12/23/2017 0419   Recent Labs    12/14/17 0520 12/15/17 0519 12/17/17 0437 12/21/17 1304 12/22/17 0526 12/23/17 0419 01/10/18 01/17/18  NA 136 135 133* 134* 133* 132* 138 135*  K 5.0 4.8 5.9* 5.7* 6.2* 5.9* 5.2 5.9*  CL 97* 96* 96* 95* 95* 96*  --   --   CO2 25 25 21* 23 21* 21*  --   --   GLUCOSE 77 75 91 89 78 71  --   --   BUN 62* 43* 77* 74* 81* 65* 52* 67*  CREATININE 11.43* 9.11* 13.48* 12.84* 14.21* 12.43* 8.6* 9.9*  CALCIUM 8.3* 8.5* 8.6* 9.8 9.2 9.1  --   --   PHOS 5.5* 5.6* 5.7*  --   --   --   --   --    Recent Labs    08/08/17 0337  12/07/17 1115  12/15/17 0519 12/17/17 0437 12/22/17 0526  AST 12*  --  19  --   --   --  10*  ALT 9  --  14  --   --   --  <5  ALKPHOS 87  --  56  --   --   --  54  BILITOT 0.8  --  1.1  --   --   --  0.6  PROT 7.2  --  8.3*  --   --   --  8.5*  ALBUMIN 3.2*   < > 3.0*   < > 2.6* 2.5* 2.7*   < > = values in this interval not displayed.   Recent Labs    08/14/17 0111  12/07/17 1115  12/21/17 1304 12/22/17 0526 12/23/17 0653 12/27/17 01/10/18 01/17/18    WBC 6.5   < > 8.1   < > 5.5 4.4 4.4 4.8 5.0 4.1  NEUTROABS 5.0  --  7.4  --   --  3.2  --   --   --   --   HGB 8.9*   < > 10.9*   < > 9.6* 9.5* 9.1* 9.8* 9.9* 10.5*  HCT 28.9*   < > 36.0*   < > 32.9* 32.3* 30.5* 30* 30* 32*  MCV 98.6   < > 96.3   < > 98.2 99.4 99.0  --   --   --   PLT 135*   < > 92*   < > 146* 134* 126* 132* 121* 127*   < > = values in this interval not displayed.   No results for input(s): CHOL, LDLCALC, TRIG in the last 8760 hours.  Invalid input(s): HCL No results found for: MICROALBUR  No results found for: HGBA1C No results found for: CHOL, HDL, LDLCALC, LDLDIRECT, TRIG, CHOLHDL  Significant Diagnostic Results in last 30 days:  No results found.  Assessment and Plan  No problem-specific Assessment & Plan notes found for this encounter.   Labs/tests ordered:    Darrell Delaine. Sheppard Coil, MD

## 2018-01-22 ENCOUNTER — Telehealth (INDEPENDENT_AMBULATORY_CARE_PROVIDER_SITE_OTHER): Payer: Self-pay

## 2018-01-22 NOTE — Telephone Encounter (Signed)
I called patient and advised him that only his insurance and or social worker can extend his time at the facility and he understood. He stated that his insurance would but the facility wouldn't extend his time. He stated he needs more time to heal but will see Dr Sharol Given on [name] 1/23 for his upcoming appt.

## 2018-01-22 NOTE — Telephone Encounter (Signed)
I called patient and advised him that only his insurance and or social worker can extend his time at the facility and he understood. He stated that his insurance would but the facility wouldn't extend his time. He stated he needs more time to heal but will see Dr Sharol Given on Raynelle Dick 1/23 for his upcoming appt.

## 2018-01-23 NOTE — Progress Notes (Signed)
This encounter was created in error - please disregard.

## 2018-01-24 ENCOUNTER — Ambulatory Visit (INDEPENDENT_AMBULATORY_CARE_PROVIDER_SITE_OTHER): Payer: Medicare Other | Admitting: Orthopedic Surgery

## 2018-01-24 ENCOUNTER — Encounter (INDEPENDENT_AMBULATORY_CARE_PROVIDER_SITE_OTHER): Payer: Self-pay | Admitting: Orthopedic Surgery

## 2018-01-24 VITALS — Ht 71.0 in | Wt 270.0 lb

## 2018-01-24 DIAGNOSIS — Z89412 Acquired absence of left great toe: Secondary | ICD-10-CM

## 2018-01-25 ENCOUNTER — Encounter (INDEPENDENT_AMBULATORY_CARE_PROVIDER_SITE_OTHER): Payer: Self-pay | Admitting: Orthopedic Surgery

## 2018-01-25 ENCOUNTER — Telehealth (INDEPENDENT_AMBULATORY_CARE_PROVIDER_SITE_OTHER): Payer: Self-pay | Admitting: Orthopedic Surgery

## 2018-01-25 NOTE — Telephone Encounter (Signed)
[name] from [facility] called to confirm that they are not doing anything with the spots that is on his stump.  CB#[phone].  Thank you.

## 2018-01-25 NOTE — Progress Notes (Signed)
Office Visit Note   Patient: [name]           Date of Birth: [birth year]           MRN: [id number] Visit Date: 01/24/2018              Requested by: [name], MD [address] [place], [place] [id number] PCP: [name], MD  Chief Complaint  Patient presents with  . Left Great Toe - Routine Post Op, Follow-up      HPI: Patient is a 53 year old gentleman who is 6 weeks status post left great toe amputation.  Patient is on dialysis currently wearing a postoperative shoe he complains of pain 4/10.  Assessment & Plan: Visit Diagnoses:  1. 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  Reevaluate in 4 weeks.  Follow-Up Instructions: Return in about 4 weeks (around 02/21/2018).   Ortho Exam  Patient is alert, oriented, no adenopathy, well-dressed, normal affect, normal respiratory effort. Examination of the incision is well-healed he does have some callus this was pared.  There is some venous stasis changes with brawny edema but no ulcers.  Imaging: No results found. No images are attached to the encounter.  Labs: Lab Results  Component Value Date   ESRSEDRATE 59 01/17/2018   ESRSEDRATE 103 01/10/2018   REPTSTATUS 12/17/2017 FINAL 12/12/2017   GRAMSTAIN  10/16/2012    RARE WBC PRESENT, PREDOMINANTLY PMN NO SQUAMOUS EPITHELIAL CELLS SEEN NO ORGANISMS SEEN Performed at [facility]  10/16/2012    RARE WBC PRESENT, PREDOMINANTLY PMN NO SQUAMOUS EPITHELIAL CELLS SEEN NO ORGANISMS SEEN Performed at [facility]  12/12/2017    NO GROWTH 5 DAYS Performed at [facility], [place] [address] [place], [place] [address]    LABORGA STAPHYLOCOCCUS AUREUS 12/07/2017     Lab Results  Component Value Date   ALBUMIN 2.7  (L) 12/22/2017   ALBUMIN 2.5 (L) 12/17/2017   ALBUMIN 2.6 (L) 12/15/2017    Body mass index is 37.66 kg/m.  Orders:  No orders of the defined types were placed in this encounter.  No orders of the defined types were placed in this encounter.    Procedures: No procedures performed  Clinical Data: No additional findings.  ROS:  All other systems negative, except as noted in the HPI. Review of Systems  Objective: Vital Signs: Ht 5\' 11"  (1.803 m)   Wt 270 lb (122.5 kg)   BMI 37.66 kg/m   Specialty Comments:  No specialty comments available.  PMFS History: Patient Active Problem List   Diagnosis Date Noted  . PAD (peripheral artery disease) ([place]) 12/22/2017  . Problem with dialysis access ([place]) 12/21/2017  . Anemia 12/21/2017  . Hypothyroidism 12/21/2017  . GERD (gastroesophageal reflux disease) 12/21/2017  . Anxiety 12/21/2017  . Left great toe amputee ([place]) 12/19/2017  . Endocarditis of mitral valve   . Osteomyelitis of toe of left foot ([place])   . MSSA bacteremia 12/08/2017  . Fever   . Right flank pain   . ESRD (end stage renal disease) ([place]) 09/20/2017  . ESRD (end stage renal disease) on dialysis ([place]) 08/07/2017  . Nonhealing surgical wound 02/29/2016  . Clotted dialysis access ([place]) 01/14/2016  .  Removal of staples 07/19/2012  . Complication of AV dialysis fistula 06/27/2012  . End stage renal disease ([place]) 06/27/2012   Past Medical History:  Diagnosis Date  . Anemia   . Anxiety   . Constipation   . End stage renal failure on dialysis [facility])    "[place]; MWF" (02/29/2016)  . GERD (gastroesophageal reflux disease)   . Grand mal seizure ([place]) X 4   last 1998 (02/29/2016)  . Heart murmur    no problems with per pt. - nephrologist and Dr. Coletta Memos  . History of blood transfusion 2000s   "I was having blood loss; never found out from where"  . History of kidney stones   . Hypertension    hx of - has not taken bp meds in over 2 years  .  Hypothyroidism   . Insomnia   . Migraine    "due to my BP in my late 1990s; none since" (02/29/2016)  . Nonhealing surgical wound    of the left arteriovenous graft  . Pneumonia    x 2  . Shortness of breath    rarely  . Spina bifida ([place])     Family History  Problem Relation Age of Onset  . Diabetes Mother   . Hypertension Mother   . Heart disease Mother        before age 6  . Diabetes Father   . Heart attack Father        X's 3  . Diabetes Sister   . Bipolar disorder Sister     Past Surgical History:  Procedure Laterality Date  . AMPUTATION Left 12/11/2017   Procedure: Left Great Toe Amputation;  Surgeon: [name], MD;  Location: [facility];  Service: Orthopedics;  Laterality: Left;  . APPENDECTOMY    . ARTERIOVENOUS GRAFT PLACEMENT Left 10/16/2012   left femoral goretex graft         Dr Donnetta Hutching  . AV FISTULA PLACEMENT Left 06/27/2012   Procedure: EXPLORATORY LEFT THY-GRAFT PSEUDO-ANEURYSM;  Surgeon: Conrad [place], MD;  Location: [facility];  Service: Vascular;  Laterality: Left;  Revision of left Arteriovenus gortex graft in thigh.  . COLONOSCOPY    . I&D EXTREMITY Left 02/29/2016   Procedure: DEBRIDEMENT LEFT THIGH WOUND;  Surgeon: Waynetta Sandy, MD;  Location: [facility];  Service: Vascular;  Laterality: Left;  . ILEOSTOMY  1970s  . INCISION AND DRAINAGE Left 10/16/2012   Procedure: INCISION AND Debridement left thigh graft;  Surgeon: Rosetta Posner, MD;  Location: [facility];  Service: Vascular;  Laterality: Left;  . INSERTION OF DIALYSIS CATHETER    . INSERTION OF DIALYSIS CATHETER  01/14/2016   Procedure: INSERTION OF DIALYSIS CATHETER;  Surgeon: Waynetta Sandy, MD;  Location: [place];  Service: Vascular;;  . INSERTION OF DIALYSIS CATHETER Right 08/07/2017   Procedure: INSERTION OF DIALYSIS CATHETER;  Surgeon: Waynetta Sandy, MD;  Location: [facility];  Service: Vascular;  Laterality: Right;  . INSERTION OF DIALYSIS CATHETER Right 12/21/2017   Procedure: RIGHT  FEMORAL DIALYSIS CATHETER EXCHANGE;  Surgeon: Marty Heck, MD;  Location: [facility];  Service: Vascular;  Laterality: Right;  . INSERTION OF ILIAC STENT Left 01/14/2016   Procedure: INSERTION OF ILIAC STENT;  Surgeon: Waynetta Sandy, MD;  Location: [place];  Service: Vascular;  Laterality: Left;  . INTRAOPERATIVE ARTERIOGRAM Left 01/14/2016   Procedure: INTRA OPERATIVE ARTERIOGRAM;  Surgeon: Waynetta Sandy, MD;  Location: [facility];  Service: Vascular;  Laterality: Left;  .  LITHOTRIPSY  x 3   "& a laser treatment" (02/29/2016)  . NEPHROSTOMY Bilateral 1968  . PATELLAR TENDON REPAIR Right 1990s   "big incision"  . PERIPHERAL VASCULAR CATHETERIZATION  01/14/2016   Procedure: A/V SHUNTOGRAM;  Surgeon: Waynetta Sandy, MD;  Location: [facility];  Service: Vascular;;  . REVISION OF ARTERIOVENOUS GORETEX GRAFT Left 10/16/2012   Procedure: REVISION OF LEFT FEMORAL LOOP ARTERIOVENOUS GORETEX GRAFT;  Surgeon: Rosetta Posner, MD;  Location: [facility];  Service: Vascular;  Laterality: Left;  . REVISION OF ARTERIOVENOUS GORETEX GRAFT Left 02/11/2015   Procedure: EXCISION OF SMALL SEGMENT OF EXPOSED LEFT THIGH NON FUNCTIONING  ARTERIOVENOUS GORETEX GRAFT;  Surgeon: Mal Misty, MD;  Location: [place] [facility];  Service: Vascular;  Laterality: Left;  . REVISION OF ARTERIOVENOUS GORETEX GRAFT Left 01/14/2016   Procedure: REVISION OF ARTERIOVENOUS GORETEX GRAFT;  Surgeon: Waynetta Sandy, MD;  Location: [place];  Service: Vascular;  Laterality: Left;  . REVISION OF ARTERIOVENOUS GORETEX GRAFT Left 02/29/2016   thigh/pt report  . REVISION OF ARTERIOVENOUS GORETEX GRAFT Left 02/29/2016   Procedure: POSSIBLE REVISION OF LEFT THIGH ARTERIOVENOUS GORETEX GRAFT;  Surgeon: Waynetta Sandy, MD;  Location: [facility];  Service: Vascular;  Laterality: Left;  . TEE WITHOUT CARDIOVERSION N/A 12/14/2017   Procedure: TRANSESOPHAGEAL ECHOCARDIOGRAM (TEE);  Surgeon: Acie Fredrickson Wonda Cheng, MD;  Location: [facility] ENDOSCOPY;   Service: Cardiovascular;  Laterality: N/A;  . THROMBECTOMY W/ EMBOLECTOMY Left 01/14/2016   Procedure: THROMBECTOMY ARTERIOVENOUS GORE-TEX Left thigh GRAFT;  Surgeon: Waynetta Sandy, MD;  Location: [facility];  Service: Vascular;  Laterality: Left;  . TONGUE SURGERY  ~ 1990   tongue-tie release   . ULTRASOUND GUIDANCE FOR VASCULAR ACCESS Right 08/07/2017   Procedure: ULTRASOUND GUIDANCE FOR VASCULAR CANNULATION RIGHT FEMORAL VEIN AND LEFT AV FEMORAL GRAFT;  Surgeon: Waynetta Sandy, MD;  Location: [place];  Service: Vascular;  Laterality: Right;  . UPPER EXTREMITY VENOGRAPHY Bilateral 08/09/2017   Procedure: UPPER EXTREMITY VENOGRAPHY;  Surgeon: Serafina Mitchell, MD;  Location: [facility];  Service: Cardiovascular;  Laterality: Bilateral;  . VENOGRAM N/A 09/20/2017   Procedure: RIGHT COMMON FEMORAL ARTERY EXPLORATION.  CANNULATION RIGHT COMMON FEMORAL VEIN. VENOGRAM CENTRAL ULTRA SOUND GUIDED RIGHT FEMORAL VEIN TIMES TWO.;  Surgeon: Waynetta Sandy, MD;  Location: [place] [facility];  Service: Vascular;  Laterality: N/A;  . WOUND DEBRIDEMENT Left 02/29/2016   thigh   Social History   Occupational History  . Occupation: Disabled  Tobacco Use  . Smoking status: Never Smoker  . Smokeless tobacco: Never Used  Substance and Sexual Activity  . Alcohol use: Not Currently    Alcohol/week: 0.0 standard drinks    Comment: 02/29/2016 "nothing since  the mid 1990s  . Drug use: Not Currently    Types: Marijuana    Comment: 02/29/2016 "nothing since ~ 1995"  . Sexual activity: Never

## 2018-01-28 NOTE — Telephone Encounter (Signed)
I called and sw HHN to advise that the pt was on office on 01/24/2018 no dressing change orders given. Will call with any other questions.

## 2018-02-21 ENCOUNTER — Encounter (INDEPENDENT_AMBULATORY_CARE_PROVIDER_SITE_OTHER): Payer: Self-pay | Admitting: Orthopedic Surgery

## 2018-02-21 ENCOUNTER — Ambulatory Visit (INDEPENDENT_AMBULATORY_CARE_PROVIDER_SITE_OTHER): Payer: Medicare Other | Admitting: Physician Assistant

## 2018-02-21 VITALS — Ht 71.0 in | Wt 270.0 lb

## 2018-02-21 DIAGNOSIS — N186 End stage renal disease: Secondary | ICD-10-CM

## 2018-02-21 DIAGNOSIS — Z89412 Acquired absence of left great toe: Secondary | ICD-10-CM

## 2018-02-21 DIAGNOSIS — E440 Moderate protein-calorie malnutrition: Secondary | ICD-10-CM

## 2018-02-21 DIAGNOSIS — I739 Peripheral vascular disease, unspecified: Secondary | ICD-10-CM

## 2018-02-21 DIAGNOSIS — Z992 Dependence on renal dialysis: Secondary | ICD-10-CM

## 2018-02-24 ENCOUNTER — Encounter (INDEPENDENT_AMBULATORY_CARE_PROVIDER_SITE_OTHER): Payer: Self-pay | Admitting: Physician Assistant

## 2018-02-24 NOTE — Progress Notes (Signed)
Office Visit Note   Patient: [name]           Date of Birth: 1965/12/21           MRN: [id number] Visit Date: 02/21/2018              Requested by: [name], MD [address] [place], [place] [id number] PCP: [name], MD  Chief Complaint  Patient presents with  . Left Foot - Routine Post Op, Follow-up      HPI: The patient is a 53 year old gentleman who is seen for postoperative follow-up following left great toe amputation for osteomyelitis on 12/11/2017.  He has a history of spina bifida and a myelo meningocele and was ambulatory for short distances prior to his amputation.  He utilizes a wheelchair for longer distance ambulation.  He completed a course of antibiotics for MSSA bacteremia/endocarditis felt to be related to osteomyelitis from his toe.  He also has a history of end-stage renal disease on hemodialysis.  He reports no significant issues with the amputation site.  He does report some scabbing over the amputation site.  Assessment & Plan: Visit Diagnoses:  1. Left great toe amputee ([place])   2. PAD (peripheral artery disease) ([place])   3. ESRD (end stage renal disease) on dialysis ([place])   4. Moderate protein-calorie malnutrition ([place])     Plan: Instructed the patient he can wash the areas daily with Dial soap and water and then pat dry and utilize a sock or compression sock to the area.  He can utilize cocoa butter or Shea butter for moisturization.  He will follow-up in 2 months or sooner should he have difficulties in the interim.  Follow-Up Instructions: Return in about 2 months (around 04/22/2018).   Ortho Exam  Patient is alert, oriented, no adenopathy, well-dressed, normal affect, normal respiratory effort. The left great toe amputation site is healing well with minimal crusting over the area no drainage no signs of cellulitis no signs of infection.  He has good palpable pedal pulse.  Imaging: No results found.   Labs: Lab  Results  Component Value Date   ESRSEDRATE 59 01/17/2018   ESRSEDRATE 103 01/10/2018   REPTSTATUS 12/17/2017 FINAL 12/12/2017   GRAMSTAIN  10/16/2012    RARE WBC PRESENT, PREDOMINANTLY PMN NO SQUAMOUS EPITHELIAL CELLS SEEN NO ORGANISMS SEEN Performed at [facility]  10/16/2012    RARE WBC PRESENT, PREDOMINANTLY PMN NO SQUAMOUS EPITHELIAL CELLS SEEN NO ORGANISMS SEEN Performed at [facility]  12/12/2017    NO GROWTH 5 DAYS Performed at [facility], [place] [address], [place], [place] [address]    LABORGA STAPHYLOCOCCUS AUREUS 12/07/2017     Lab Results  Component Value Date   ALBUMIN 2.7 (L) 12/22/2017   ALBUMIN 2.5 (L) 12/17/2017   ALBUMIN 2.6 (L) 12/15/2017    Body mass index is 37.66 kg/m.  Orders:  No orders of the defined types were placed in this encounter.  No orders of the defined types were placed in this encounter.    Procedures: No procedures performed  Clinical Data: No additional findings.  ROS:  All other systems negative, except as noted in the HPI. Review of Systems  Objective: Vital Signs: Ht 5\' 11"  (1.803 m)   Wt 270 lb (122.5 kg)   BMI 37.66 kg/m   Specialty Comments:  No specialty comments available.  PMFS History: Patient Active Problem List   Diagnosis Date  Noted  . PAD (peripheral artery disease) ([place]) 12/22/2017  . Problem with dialysis access ([place]) 12/21/2017  . Anemia 12/21/2017  . Hypothyroidism 12/21/2017  . GERD (gastroesophageal reflux disease) 12/21/2017  . Anxiety 12/21/2017  . Left great toe amputee ([place]) 12/19/2017  . Endocarditis of mitral valve   . Osteomyelitis of toe of left foot ([place])   . MSSA bacteremia 12/08/2017  . Fever   . Right flank pain   . ESRD (end stage renal disease) ([place]) 09/20/2017  . ESRD (end stage renal disease) on dialysis ([place]) 08/07/2017  . Nonhealing surgical wound 02/29/2016  . Clotted dialysis access ([place]) 01/14/2016  . Removal of staples  07/19/2012  . Complication of AV dialysis fistula 06/27/2012  . End stage renal disease ([place]) 06/27/2012   Past Medical History:  Diagnosis Date  . Anemia   . Anxiety   . Constipation   . End stage renal failure on dialysis [facility])    "[place]; MWF" (02/29/2016)  . GERD (gastroesophageal reflux disease)   . Grand mal seizure ([place]) X 4   last 1998 (02/29/2016)  . Heart murmur    no problems with per pt. - nephrologist and Dr. Coletta Memos  . History of blood transfusion 2000s   "I was having blood loss; never found out from where"  . History of kidney stones   . Hypertension    hx of - has not taken bp meds in over 2 years  . Hypothyroidism   . Insomnia   . Migraine    "due to my BP in my late 1990s; none since" (02/29/2016)  . Nonhealing surgical wound    of the left arteriovenous graft  . Pneumonia    x 2  . Shortness of breath    rarely  . Spina bifida ([place])     Family History  Problem Relation Age of Onset  . Diabetes Mother   . Hypertension Mother   . Heart disease Mother        before age 68  . Diabetes Father   . Heart attack Father        X's 3  . Diabetes Sister   . Bipolar disorder Sister     Past Surgical History:  Procedure Laterality Date  . AMPUTATION Left 12/11/2017   Procedure: Left Great Toe Amputation;  Surgeon: [name], MD;  Location: [facility];  Service: Orthopedics;  Laterality: Left;  . APPENDECTOMY    . ARTERIOVENOUS GRAFT PLACEMENT Left 10/16/2012   left femoral goretex graft         Dr Donnetta Hutching  . AV FISTULA PLACEMENT Left 06/27/2012   Procedure: EXPLORATORY LEFT THY-GRAFT PSEUDO-ANEURYSM;  Surgeon: Conrad Wilmette, MD;  Location: [facility];  Service: Vascular;  Laterality: Left;  Revision of left Arteriovenus gortex graft in thigh.  . COLONOSCOPY    . I&D EXTREMITY Left 02/29/2016   Procedure: DEBRIDEMENT LEFT THIGH WOUND;  Surgeon: Waynetta Sandy, MD;  Location: [facility];  Service: Vascular;  Laterality: Left;  . ILEOSTOMY  1970s  . INCISION  AND DRAINAGE Left 10/16/2012   Procedure: INCISION AND Debridement left thigh graft;  Surgeon: Rosetta Posner, MD;  Location: [facility];  Service: Vascular;  Laterality: Left;  . INSERTION OF DIALYSIS CATHETER    . INSERTION OF DIALYSIS CATHETER  01/14/2016   Procedure: INSERTION OF DIALYSIS CATHETER;  Surgeon: Waynetta Sandy, MD;  Location: [place];  Service: Vascular;;  . INSERTION OF DIALYSIS CATHETER Right 08/07/2017   Procedure:  INSERTION OF DIALYSIS CATHETER;  Surgeon: Waynetta Sandy, MD;  Location: [facility];  Service: Vascular;  Laterality: Right;  . INSERTION OF DIALYSIS CATHETER Right 12/21/2017   Procedure: RIGHT FEMORAL DIALYSIS CATHETER EXCHANGE;  Surgeon: Marty Heck, MD;  Location: [facility];  Service: Vascular;  Laterality: Right;  . INSERTION OF ILIAC STENT Left 01/14/2016   Procedure: INSERTION OF ILIAC STENT;  Surgeon: Waynetta Sandy, MD;  Location: [facility];  Service: Vascular;  Laterality: Left;  . INTRAOPERATIVE ARTERIOGRAM Left 01/14/2016   Procedure: INTRA OPERATIVE ARTERIOGRAM;  Surgeon: Waynetta Sandy, MD;  Location: [facility];  Service: Vascular;  Laterality: Left;  . LITHOTRIPSY  x 3   "& a laser treatment" (02/29/2016)  . NEPHROSTOMY Bilateral 1968  . PATELLAR TENDON REPAIR Right 1990s   "big incision"  . PERIPHERAL VASCULAR CATHETERIZATION  01/14/2016   Procedure: A/V SHUNTOGRAM;  Surgeon: Waynetta Sandy, MD;  Location: [facility];  Service: Vascular;;  . REVISION OF ARTERIOVENOUS GORETEX GRAFT Left 10/16/2012   Procedure: REVISION OF LEFT FEMORAL LOOP ARTERIOVENOUS GORETEX GRAFT;  Surgeon: Rosetta Posner, MD;  Location: [place];  Service: Vascular;  Laterality: Left;  . REVISION OF ARTERIOVENOUS GORETEX GRAFT Left 02/11/2015   Procedure: EXCISION OF SMALL SEGMENT OF EXPOSED LEFT THIGH NON FUNCTIONING  ARTERIOVENOUS GORETEX GRAFT;  Surgeon: Mal Misty, MD;  Location: [place];  Service: Vascular;  Laterality: Left;  . REVISION OF ARTERIOVENOUS  GORETEX GRAFT Left 01/14/2016   Procedure: REVISION OF ARTERIOVENOUS GORETEX GRAFT;  Surgeon: Waynetta Sandy, MD;  Location: [facility];  Service: Vascular;  Laterality: Left;  . REVISION OF ARTERIOVENOUS GORETEX GRAFT Left 02/29/2016   thigh/pt report  . REVISION OF ARTERIOVENOUS GORETEX GRAFT Left 02/29/2016   Procedure: POSSIBLE REVISION OF LEFT THIGH ARTERIOVENOUS GORETEX GRAFT;  Surgeon: Waynetta Sandy, MD;  Location: [facility];  Service: Vascular;  Laterality: Left;  . TEE WITHOUT CARDIOVERSION N/A 12/14/2017   Procedure: TRANSESOPHAGEAL ECHOCARDIOGRAM (TEE);  Surgeon: Acie Fredrickson Wonda Cheng, MD;  Location: [facility] ENDOSCOPY;  Service: Cardiovascular;  Laterality: N/A;  . THROMBECTOMY W/ EMBOLECTOMY Left 01/14/2016   Procedure: THROMBECTOMY ARTERIOVENOUS GORE-TEX Left thigh GRAFT;  Surgeon: Waynetta Sandy, MD;  Location: [facility];  Service: Vascular;  Laterality: Left;  . TONGUE SURGERY  ~ 1990   tongue-tie release   . ULTRASOUND GUIDANCE FOR VASCULAR ACCESS Right 08/07/2017   Procedure: ULTRASOUND GUIDANCE FOR VASCULAR CANNULATION RIGHT FEMORAL VEIN AND LEFT AV FEMORAL GRAFT;  Surgeon: Waynetta Sandy, MD;  Location: [place];  Service: Vascular;  Laterality: Right;  . UPPER EXTREMITY VENOGRAPHY Bilateral 08/09/2017   Procedure: UPPER EXTREMITY VENOGRAPHY;  Surgeon: Serafina Mitchell, MD;  Location: [facility];  Service: Cardiovascular;  Laterality: Bilateral;  . VENOGRAM N/A 09/20/2017   Procedure: RIGHT COMMON FEMORAL ARTERY EXPLORATION.  CANNULATION RIGHT COMMON FEMORAL VEIN. VENOGRAM CENTRAL ULTRA SOUND GUIDED RIGHT FEMORAL VEIN TIMES TWO.;  Surgeon: Waynetta Sandy, MD;  Location: [place];  Service: Vascular;  Laterality: N/A;  . WOUND DEBRIDEMENT Left 02/29/2016   thigh   Social History   Occupational History  . Occupation: Disabled  Tobacco Use  . Smoking status: Never Smoker  . Smokeless tobacco: Never Used  Substance and Sexual Activity  . Alcohol  use: Not Currently    Alcohol/week: 0.0 standard drinks    Comment: 02/29/2016 "nothing since  the mid 1990s  . Drug use: Not Currently    Types: Marijuana    Comment: 02/29/2016 "nothing since ~  1995"  . Sexual activity: Never

## 2018-02-26 DIAGNOSIS — Z6837 Body mass index (BMI) 37.0-37.9, adult: Secondary | ICD-10-CM | POA: Insufficient documentation

## 2018-04-24 ENCOUNTER — Telehealth: Payer: Self-pay | Admitting: Cardiovascular Disease

## 2018-04-24 DIAGNOSIS — E441 Mild protein-calorie malnutrition: Secondary | ICD-10-CM | POA: Insufficient documentation

## 2018-04-24 NOTE — Telephone Encounter (Signed)
VIDEO/Doximity visit on 04/25/2018      Phone Call to obtain consent  - 04/24/2018          Virtual Visit Pre-Appointment Phone Call  "(Name), I am calling you today to discuss your upcoming appointment. We are currently trying to limit exposure to the virus that causes COVID-19 by seeing patients at home rather than in the office."  1. "What is the BEST phone number to call the day of the visit?" - include this in appointment notes  2. Do you have or have access to (through a family member/friend) a smartphone with video capability that we can use for your visit?" a. If yes - list this number in appt notes as cell (if different from BEST phone #) and list the appointment type as a VIDEO visit in appointment notes b. If no - list the appointment type as a PHONE visit in appointment notes  3. Confirm consent - "In the setting of the current Covid19 crisis, you are scheduled for a (phone or video) visit with your provider on (date) at (time).  Just as we do with many in-office visits, in order for you to participate in this visit, we must obtain consent.  If you'd like, I can send this to your mychart (if signed up) or email for you to review.  Otherwise, I can obtain your verbal consent now.  All virtual visits are billed to your insurance company just like a normal visit would be.  By agreeing to a virtual visit, we'd like you to understand that the technology does not allow for your provider to perform an examination, and thus may limit your provider's ability to fully assess your condition. If your provider identifies any concerns that need to be evaluated in person, we will make arrangements to do so.  Finally, though the technology is pretty good, we cannot assure that it will always work on either your or our end, and in the setting of a video visit, we may have to convert it to a phone-only visit.  In either situation, we cannot ensure that we have a secure connection.  Are you  willing to proceed?" STAFF: Did the patient verbally acknowledge consent to telehealth visit? Document YES/NO here: YES  4. Advise patient to be prepared - "Two hours prior to your appointment, go ahead and check your blood pressure, pulse, oxygen saturation, and your weight (if you have the equipment to check those) and write them all down. When your visit starts, your provider will ask you for this information. If you have an Apple Watch or Kardia device, please plan to have heart rate information ready on the day of your appointment. Please have a pen and paper handy nearby the day of the visit as well."  5. Give patient instructions for MyChart download to smartphone OR Doximity/Doxy.me as below if video visit (depending on what platform provider is using)  6. Inform patient they will receive a phone call 15 minutes prior to their appointment time (may be from unknown caller ID) so they should be prepared to answer    TELEPHONE CALL NOTE  [name] has been deemed a candidate for a follow-up tele-health visit to limit community exposure during the Covid-19 pandemic. I spoke with the patient via phone to ensure availability of phone/video source, confirm preferred email & phone number, and discuss instructions and expectations.  I reminded [name] to be prepared with any vital sign and/or heart rhythm information that could  potentially be obtained via home monitoring, at the time of his visit. I reminded [name] to expect a phone call prior to his visit.  Thayer Headings 04/24/2018 11:19 AM   INSTRUCTIONS FOR DOWNLOADING THE MYCHART APP TO SMARTPHONE  - The patient must first make sure to have activated MyChart and know their login information - If Apple, go to [employer] and type in MyChart in the search bar and download the app. If Android, ask patient to go to [employer] and type in [place] in the search bar and download the app. The app is free but as with any  other app downloads, their phone may require them to verify saved payment information or Apple/Android password.  - The patient will need to then log into the app with their MyChart username and password, and select [facility] as their healthcare provider to link the account. When it is time for your visit, go to the MyChart app, find appointments, and click Begin Video Visit. Be sure to Select Allow for your device to access the Microphone and Camera for your visit. You will then be connected, and your provider will be with you shortly.  **If they have any issues connecting, or need assistance please contact MyChart service desk [phone])**  **If using a computer, in order to ensure the best quality for their visit they will need to use either of the following Internet Browsers: [employer], or Google Chrome**  IF USING DOXIMITY or DOXY.ME - The patient will receive a link just prior to their visit by text.     FULL LENGTH CONSENT FOR TELE-HEALTH VISIT   I hereby voluntarily request, consent and authorize [facility] and its employed or contracted physicians, physician assistants, nurse practitioners or other licensed health care professionals (the Practitioner), to provide me with telemedicine health care services (the Services") as deemed necessary by the treating Practitioner. I acknowledge and consent to receive the Services by the Practitioner via telemedicine. I understand that the telemedicine visit will involve communicating with the Practitioner through live audiovisual communication technology and the disclosure of certain medical information by electronic transmission. I acknowledge that I have been given the opportunity to request an in-person assessment or other available alternative prior to the telemedicine visit and am voluntarily participating in the telemedicine visit.  I understand that I have the right to withhold or withdraw my consent to the use of  telemedicine in the course of my care at any time, without affecting my right to future care or treatment, and that the Practitioner or I may terminate the telemedicine visit at any time. I understand that I have the right to inspect all information obtained and/or recorded in the course of the telemedicine visit and may receive copies of available information for a reasonable fee.  I understand that some of the potential risks of receiving the Services via telemedicine include:   Delay or interruption in medical evaluation due to technological equipment failure or disruption;  Information transmitted may not be sufficient (e.g. poor resolution of images) to allow for appropriate medical decision making by the Practitioner; and/or   In rare instances, security protocols could fail, causing a breach of personal health information.  Furthermore, I acknowledge that it is my responsibility to provide information about my medical history, conditions and care that is complete and accurate to the best of my ability. I acknowledge that Practitioner's advice, recommendations, and/or decision may be based on factors not within their control, such  as incomplete or inaccurate data provided by me or distortions of diagnostic images or specimens that may result from electronic transmissions. I understand that the practice of medicine is not an exact science and that Practitioner makes no warranties or guarantees regarding treatment outcomes. I acknowledge that I will receive a copy of this consent concurrently upon execution via email to the email address I last provided but may also request a printed copy by calling the office of [facility].    I understand that my insurance will be billed for this visit.   I have read or had this consent read to me.  I understand the contents of this consent, which adequately explains the benefits and risks of the Services being provided via telemedicine.   I have been  provided ample opportunity to ask questions regarding this consent and the Services and have had my questions answered to my satisfaction.  I give my informed consent for the services to be provided through the use of telemedicine in my medical care  By participating in this telemedicine visit I agree to the above.

## 2018-04-25 ENCOUNTER — Telehealth: Payer: Federal, State, Local not specified - PPO | Admitting: Cardiovascular Disease

## 2018-04-25 ENCOUNTER — Ambulatory Visit (INDEPENDENT_AMBULATORY_CARE_PROVIDER_SITE_OTHER): Payer: Self-pay | Admitting: Orthopedic Surgery

## 2018-04-25 ENCOUNTER — Other Ambulatory Visit: Payer: Self-pay

## 2018-04-25 ENCOUNTER — Telehealth: Payer: Self-pay | Admitting: Cardiovascular Disease

## 2018-04-25 NOTE — Telephone Encounter (Signed)
New Message    Pts not feeling well, he would like to cancel his virtual appt today and he will call back to reschedule

## 2018-04-25 NOTE — Progress Notes (Signed)
Entered in error

## 2018-04-25 NOTE — Telephone Encounter (Signed)
Spoke with patient who states he needs to reschedule his appointment with Dr. Angelena Form due to problems he is having today that will not allow him to have virtual visit. He is rescheduled to May 1 with Dr. Angelena Form. I advised him to call back prior to that date with questions or concerns. He thanked me for the call.

## 2018-05-03 ENCOUNTER — Telehealth (INDEPENDENT_AMBULATORY_CARE_PROVIDER_SITE_OTHER): Payer: Medicare Other | Admitting: Cardiovascular Disease

## 2018-05-03 ENCOUNTER — Encounter: Payer: Self-pay | Admitting: Cardiovascular Disease

## 2018-05-03 ENCOUNTER — Other Ambulatory Visit: Payer: Self-pay

## 2018-05-03 VITALS — Ht 71.0 in | Wt 271.0 lb

## 2018-05-03 DIAGNOSIS — I330 Acute and subacute infective endocarditis: Secondary | ICD-10-CM

## 2018-05-03 NOTE — Patient Instructions (Addendum)
Medication Instructions:  Your physician recommends that you continue on your current medications as directed. Please refer to the Current Medication list given to you today.  If you need a refill on your cardiac medications before your next appointment, please call your pharmacy.   Lab work: None Ordered  If you have labs (blood work) drawn today and your tests are completely normal, you will receive your results only by: Marland Kitchen MyChart Message (if you have MyChart) OR . A paper copy in the mail If you have any lab test that is abnormal or we need to change your treatment, we will call you to review the results.  Testing/Procedures: None ordered  Follow-Up: At [facility], you and your health needs are our priority.  As part of our continuing mission to provide you with exceptional heart care, we have created designated Provider Care Teams.  These Care Teams include your primary Cardiologist (physician) and Advanced Practice Providers (APPs -  Physician Assistants and Nurse Practitioners) who all work together to provide you with the care you need, when you need it. . You will need a follow up appointment in 1 year.  Please call our office 2 months in advance to schedule this appointment.  You may see Darlina Guys, MD or one of the following Advanced Practice Providers on your designated Care Team:   . Lyda Jester, PA-C . Dayna Dunn, PA-C . Ermalinda Barrios, PA-C  Any Other Special Instructions Will Be Listed Below (If Applicable).  Dr. Angelena Form has sent a message to the Infectious Disease Doctor to see if you will need a repeat TEE or surface echo following the pandemic to take a look at your mitral valve

## 2018-05-03 NOTE — Progress Notes (Signed)
Virtual Visit via Video Note   This visit type was conducted due to national recommendations for restrictions regarding the COVID-19 Pandemic (e.g. social distancing) in an effort to limit this patient's exposure and mitigate transmission in our community.  Due to his co-morbid illnesses, this patient is at least at moderate risk for complications without adequate follow up.  This format is felt to be most appropriate for this patient at this time.  All issues noted in this document were discussed and addressed.  A limited physical exam was performed with this format.  Please refer to the patient's chart for his consent to telehealth for [facility].   Date:  05/03/2018   ID:  [name], DOB 24-Aug-1965, MRN [id number]  Patient Location: Home Provider Location: Office  PCP:  [name], MD  Cardiologist:  Lauree Chandler, MD  Electrophysiologist:  None   Evaluation Performed:  Follow-Up Visit  Chief Complaint:  Follow up-Endocarditis  History of Present Illness:    [name] is a 53 y.o. male with history of bacterial endocarditis, HTN, ESRD on HD, GERD, seizure disorder and hypothyroidism who is being seen today by virtual e-visit due to the Covid 19 pandemic. I saw him as a new consult in October 2019 for pre-operative evaluation prior to vascular surgery. He became ESRD in 1997. He has spinal bifida with myelomeningocele and kidney stones. He has no known cardiac disease. He had an echo in February 2019 in an outside hospital that showed normal LV systolic function with KNLZ=76%. Grade 2 diastolic dysfunction. Moderate aortic valve stenosis with mean gradient of 18 mmHg across the aortic valve. Moderate mitral stenosis with mean gradient 6 mmHg. He has an ileostomy. He has undergone stenting of the left iliac artery in 2018.  He is followed closely by Dr. Donzetta Matters in [facility] with attempts at vascular access. His father had CAD. He was cleared for his vascular surgery in  October 2019 and did well. 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 Also found to have MSSA bacteremia. TEE on 12/14/17 with a small mobile vegetation on the annulus of the mitral valve, mild MS, mild MR. He was discharged on IV antiobiotics.   He tells me today that he has been feeling well. No chest pain or dyspnea. No fever or chills  The patient does not have symptoms concerning for COVID-19 infection (fever, chills, cough, or new shortness of breath).    Past Medical History:  Diagnosis Date   Anemia    Anxiety    Constipation    End stage renal failure on dialysis [facility])    "[place]; MWF" (02/29/2016)   GERD (gastroesophageal reflux disease)    Grand mal seizure ([place]) X 4   last 1998 (02/29/2016)   Heart murmur    no problems with per pt. - nephrologist and Dr. Coletta Memos   History of blood transfusion 2000s   "I was having blood loss; never found out from where"   History of kidney stones    Hypertension    hx of - has not taken bp meds in over 2 years   Hypothyroidism    Insomnia    Migraine    "due to my BP in my late 1990s; none since" (02/29/2016)   Nonhealing surgical wound    of the left arteriovenous graft   Pneumonia    x 2   Shortness of breath  rarely   Spina bifida [facility])    Past Surgical History:  Procedure Laterality Date   AMPUTATION Left 12/11/2017   Procedure: Left Great Toe Amputation;  Surgeon: [name], MD;  Location: [place];  Service: Orthopedics;  Laterality: Left;   APPENDECTOMY     ARTERIOVENOUS GRAFT PLACEMENT Left 10/16/2012   left femoral goretex graft         Dr Donnetta Hutching   AV FISTULA PLACEMENT Left 06/27/2012   Procedure: EXPLORATORY LEFT THY-GRAFT PSEUDO-ANEURYSM;  Surgeon: Conrad [place], MD;  Location: [place];  Service: Vascular;  Laterality: Left;  Revision of left Arteriovenus gortex graft in thigh.   COLONOSCOPY     I&D EXTREMITY Left  02/29/2016   Procedure: DEBRIDEMENT LEFT THIGH WOUND;  Surgeon: Waynetta Sandy, MD;  Location: [place];  Service: Vascular;  Laterality: Left;   ILEOSTOMY  1970s   INCISION AND DRAINAGE Left 10/16/2012   Procedure: INCISION AND Debridement left thigh graft;  Surgeon: Rosetta Posner, MD;  Location: [place];  Service: Vascular;  Laterality: Left;   INSERTION OF DIALYSIS CATHETER     INSERTION OF DIALYSIS CATHETER  01/14/2016   Procedure: INSERTION OF DIALYSIS CATHETER;  Surgeon: Waynetta Sandy, MD;  Location: [place];  Service: Vascular;;   INSERTION OF DIALYSIS CATHETER Right 08/07/2017   Procedure: INSERTION OF DIALYSIS CATHETER;  Surgeon: Waynetta Sandy, MD;  Location: [place];  Service: Vascular;  Laterality: Right;   INSERTION OF DIALYSIS CATHETER Right 12/21/2017   Procedure: RIGHT FEMORAL DIALYSIS CATHETER EXCHANGE;  Surgeon: Marty Heck, MD;  Location: [place];  Service: Vascular;  Laterality: Right;   INSERTION OF ILIAC STENT Left 01/14/2016   Procedure: INSERTION OF ILIAC STENT;  Surgeon: Waynetta Sandy, MD;  Location: [place];  Service: Vascular;  Laterality: Left;   INTRAOPERATIVE ARTERIOGRAM Left 01/14/2016   Procedure: INTRA OPERATIVE ARTERIOGRAM;  Surgeon: Waynetta Sandy, MD;  Location: [place];  Service: Vascular;  Laterality: Left;   LITHOTRIPSY  x 3   "& a laser treatment" (02/29/2016)   NEPHROSTOMY Bilateral 1968   PATELLAR TENDON REPAIR Right 1990s   "big incision"   PERIPHERAL VASCULAR CATHETERIZATION  01/14/2016   Procedure: A/V SHUNTOGRAM;  Surgeon: Waynetta Sandy, MD;  Location: [place];  Service: Vascular;;   REVISION OF ARTERIOVENOUS GORETEX GRAFT Left 10/16/2012   Procedure: REVISION OF LEFT FEMORAL LOOP ARTERIOVENOUS GORETEX GRAFT;  Surgeon: Rosetta Posner, MD;  Location: [place];  Service: Vascular;  Laterality: Left;   REVISION OF ARTERIOVENOUS GORETEX GRAFT Left 02/11/2015   Procedure: EXCISION OF SMALL SEGMENT  OF EXPOSED LEFT THIGH NON FUNCTIONING  ARTERIOVENOUS GORETEX GRAFT;  Surgeon: Mal Misty, MD;  Location: [place];  Service: Vascular;  Laterality: Left;   REVISION OF ARTERIOVENOUS GORETEX GRAFT Left 01/14/2016   Procedure: REVISION OF ARTERIOVENOUS GORETEX GRAFT;  Surgeon: Waynetta Sandy, MD;  Location: [place];  Service: Vascular;  Laterality: Left;   REVISION OF ARTERIOVENOUS GORETEX GRAFT Left 02/29/2016   thigh/pt report   REVISION OF ARTERIOVENOUS GORETEX GRAFT Left 02/29/2016   Procedure: POSSIBLE REVISION OF LEFT THIGH ARTERIOVENOUS GORETEX GRAFT;  Surgeon: Waynetta Sandy, MD;  Location: [place];  Service: Vascular;  Laterality: Left;   TEE WITHOUT CARDIOVERSION N/A 12/14/2017   Procedure: TRANSESOPHAGEAL ECHOCARDIOGRAM (TEE);  Surgeon: Acie Fredrickson Wonda Cheng, MD;  Location: [facility] END[facility]COPY;  Service: Cardiovascular;  Laterality: N/A;   THROMBECTOMY W/ EMBOLECTOMY Left 01/14/2016   Procedure: THROMBECTOMY ARTERIOVENOUS GORE-TEX Left thigh  GRAFT;  Surgeon: Waynetta Sandy, MD;  Location: [facility] OR;  Service: Vascular;  Laterality: Left;   TONGUE SURGERY  ~ 1990   tongue-tie release    ULTRASOUND GUIDANCE FOR VASCULAR ACCESS Right 08/07/2017   Procedure: ULTRASOUND GUIDANCE FOR VASCULAR CANNULATION RIGHT FEMORAL VEIN AND LEFT AV FEMORAL GRAFT;  Surgeon: Waynetta Sandy, MD;  Location: [place];  Service: Vascular;  Laterality: Right;   UPPER EXTREMITY VENOGRAPHY Bilateral 08/09/2017   Procedure: UPPER EXTREMITY VENOGRAPHY;  Surgeon: Serafina Mitchell, MD;  Location: [place] [facility];  Service: Cardiovascular;  Laterality: Bilateral;   VENOGRAM N/A 09/20/2017   Procedure: RIGHT COMMON FEMORAL ARTERY EXPLORATION.  CANNULATION RIGHT COMMON FEMORAL VEIN. VENOGRAM CENTRAL ULTRA SOUND GUIDED RIGHT FEMORAL VEIN TIMES TWO.;  Surgeon: Waynetta Sandy, MD;  Location: [facility] OR;  Service: Vascular;  Laterality: N/A;   WOUND DEBRIDEMENT Left 02/29/2016   thigh      Current Meds  Medication Sig   aspirin 325 MG EC tablet Take 325 mg by mouth daily.   calcium acetate (PHOSLO) 667 MG capsule Take 3-4 capsules (2,001-2,668 mg total) by mouth See admin instructions. Take 2708 mg by mouth 3 times daily with meals, take 2001 mg by mouth with snacks   clopidogrel (PLAVIX) 75 MG tablet Take 1 tablet (75 mg total) by mouth daily.   famotidine (PEPCID) 40 MG tablet Take 1 tablet (40 mg total) by mouth daily at 2 PM.   fluticasone (FLONASE) 50 MCG/ACT nasal spray Place 2 sprays into both nostrils as needed for allergies or rhinitis (congestion).   levothyroxine (SYNTHROID, LEVOTHROID) 125 MCG tablet Take 1 tablet (125 mcg total) by mouth daily before breakfast.   methocarbamol (ROBAXIN) 500 MG tablet Take 1 tablet (500 mg total) by mouth every 6 (six) hours as needed for muscle spasms.   midodrine (PROAMATINE) 5 MG tablet Take 1 tablet (5 mg total) by mouth 3 (three) times daily with meals.   Nutritional Supplements (NEPRO PO) Take 1 Can by mouth daily.   oxyCODONE-acetaminophen (PERCOCET) 10-325 MG tablet Take 1 tablet by mouth 3 (three) times daily.   Polyethylene Glycol 3350 POWD Take 17 g by mouth daily as needed. [place] 1 CAPFUL (17GM) IN 4-8OZ. OF WATER AND DRINK BY MOUTH DAILY AS NEEDED FOR MILD CONSTIPATION   promethazine (PHENERGAN) 25 MG tablet Take 1 tablet (25 mg total) by mouth 2 (two) times daily as needed for nausea or vomiting.   sodium polystyrene (KAYEXALATE) powder Take 15 g by mouth See admin instructions. Take 15 g (mixed in water) by mouth on Saturdays and Sundays   sodium zirconium cyclosilicate (LOKELMA) 10 g PACK packet Take 10 g by mouth daily. TAKE 10GM PACK ON OFF DIALYSIS DAYS ON TUESDAY ON THURSDAY ON SUNDAY   traZODone (DESYREL) 100 MG tablet Take 1 tablet (100 mg total) by mouth at bedtime.   zolpidem (AMBIEN) 10 MG tablet Take 1 tablet (10 mg total) by mouth at bedtime.     Allergies:   Furadantin [nitrofurantoin];  Mandelamine [methenamine]; Noroxin [norfloxacin]; Carmine; Contrast media [iodinated diagnostic agents]; Hydrocodone; Metrizamide; Sulfa antibiotics; and Sulfur   Social History   Tobacco Use   Smoking status: Never Smoker   Smokeless tobacco: Never Used  Substance Use Topics   Alcohol use: Not Currently    Alcohol/week: 0.0 standard drinks    Comment: 02/29/2016 "nothing since  the mid 1990s   Drug use: Not Currently    Types: Marijuana    Comment: 02/29/2016 "nothing since ~  1995"     Family Hx: The patient's family history includes Bipolar disorder in his sister; Diabetes in his father, mother, and sister; Heart attack in his father; Heart disease in his mother; Hypertension in his mother.  ROS:   Please see the history of present illness.    All other systems reviewed and are negative.   Prior CV studies:   The following studies were reviewed today:  TEE 12/14/17: - Left ventricle: The cavity size was normal. Wall thickness was   normal. Systolic function was normal. - Aortic valve: Valve mobility was moderately restricted. No   evidence of vegetation. - Mitral valve: Mildly to moderately calcified annulus. There was a   small vegetation on the atrial aspect of the base of the   posterior leaflet; the appearance is consistent with vegetation. - Left atrium: No evidence of thrombus in the atrial cavity or   appendage. - Atrial septum: No defect or patent foramen ovale was identified. - Tricuspid valve: No evidence of vegetation. - Pulmonic valve: No evidence of vegetation.  Echo 12/10/17: - Left ventricle: The cavity size was normal. There was mild   concentric hypertrophy. Systolic function was normal. The   estimated ejection fraction was in the range of 60% to 65%. There   was dynamic obstruction at restin the mid cavity, with a peak   velocity of 231 cm/sec and a peak gradient of 21 mm Hg. Wall   motion was normal; there were no regional wall motion    abnormalities. Doppler parameters are consistent with abnormal   left ventricular relaxation (grade 1 diastolic dysfunction).   Doppler parameters are consistent with high ventricular filling   pressure. - Aortic valve: A bicuspid morphology cannot be excluded;   moderately thickened, moderately calcified leaflets. Valve   mobility was restricted. There was moderate stenosis. There was   no regurgitation. Peak velocity (S): 310 cm/s. Mean gradient (S):   22 mm Hg. Valve area (VTI): 2.2 cm^2. Valve area (Vmax): 1.97   cm^2. Valve area (Vmean): 1.82 cm^2. - Mitral valve: Transvalvular velocity was within the normal range.   There was no evidence for stenosis. There was trivial   regurgitation. - Left atrium: The atrium was severely dilated. - Right ventricle: The cavity size was normal. Wall thickness was   normal. Systolic function was normal. - Atrial septum: No defect or patent foramen ovale was identified   by color flow Doppler. - Tricuspid valve: There was trivial regurgitation. - Pulmonary arteries: Systolic pressure was within the normal   range. PA peak pressure: 20 mm Hg (S).  Labs/Other Tests and Data Reviewed:    EKG:  No ECG reviewed.  Recent Labs: 12/22/2017: ALT <5 01/17/2018: BUN 67; Creatinine 9.9; Hemoglobin 10.5; Platelets 127; Potassium 5.9; Sodium 135   Recent Lipid Panel No results found for: CHOL, TRIG, HDL, CHOLHDL, LDLCALC, LDLDIRECT  Wt Readings from Last 3 Encounters:  05/03/18 271 lb (122.9 kg)  02/21/18 270 lb (122.5 kg)  01/24/18 270 lb (122.5 kg)     Objective:    Vital Signs:  Ht 5\' 11"  (1.803 m)    Wt 271 lb (122.9 kg)    BMI 37.80 kg/m    VITAL SIGNS:  reviewed GEN:  no acute distress  ASSESSMENT & PLAN:    1. Bacterial endocarditis: Mobile mass noted on annulus of the mitral valve by TEE December 2019. He has has completed the course of antibiotics. No recent fever or chills. No mention of a repeat  TEE needed by the ID team (Dr.  Johnnye Sima). Will send a message to Dr. Johnnye Sima to clarify this.     COVID-19 Education: The signs and symptoms of COVID-19 were discussed with the patient and how to seek care for testing (follow up with PCP or arrange E-visit).  The importance of social distancing was discussed today.  Time:   Today, I have spent 17 minutes with the patient with telehealth technology discussing the above problems.     Medication Adjustments/Labs and Tests Ordered: Current medicines are reviewed at length with the patient today.  Concerns regarding medicines are outlined above.   Tests Ordered: No orders of the defined types were placed in this encounter.   Medication Changes: No orders of the defined types were placed in this encounter.   Disposition:  Follow up in 1 year(s)  Signed, Lauree Chandler, MD  05/03/2018 2:34 PM    [place]

## 2018-06-12 ENCOUNTER — Encounter (HOSPITAL_COMMUNITY): Payer: Self-pay | Admitting: *Deleted

## 2018-06-12 ENCOUNTER — Other Ambulatory Visit: Payer: Self-pay

## 2018-06-12 ENCOUNTER — Emergency Department (HOSPITAL_COMMUNITY): Payer: Medicare Other | Admitting: Anesthesiology

## 2018-06-12 ENCOUNTER — Inpatient Hospital Stay (HOSPITAL_COMMUNITY)
Admission: EM | Admit: 2018-06-12 | Discharge: 2018-06-15 | DRG: 252 | Disposition: A | Discharge status: Home / Self Care | Payer: Medicare Other | Source: Ambulatory Visit | Attending: Vascular Surgery | Admitting: Vascular Surgery

## 2018-06-12 ENCOUNTER — Other Ambulatory Visit: Payer: Self-pay | Admitting: Vascular Surgery

## 2018-06-12 ENCOUNTER — Encounter (HOSPITAL_COMMUNITY)
Admission: EM | Disposition: A | Discharge status: Home / Self Care | Payer: Self-pay | Source: Ambulatory Visit | Attending: Vascular Surgery

## 2018-06-12 DIAGNOSIS — G4700 Insomnia, unspecified: Secondary | ICD-10-CM | POA: Diagnosis present

## 2018-06-12 DIAGNOSIS — E1122 Type 2 diabetes mellitus with diabetic chronic kidney disease: Secondary | ICD-10-CM | POA: Diagnosis present

## 2018-06-12 DIAGNOSIS — Z1159 Encounter for screening for other viral diseases: Secondary | ICD-10-CM | POA: Diagnosis not present

## 2018-06-12 DIAGNOSIS — Y832 Surgical operation with anastomosis, bypass or graft as the cause of abnormal reaction of the patient, or of later complication, without mention of misadventure at the time of the procedure: Secondary | ICD-10-CM | POA: Diagnosis present

## 2018-06-12 DIAGNOSIS — F419 Anxiety disorder, unspecified: Secondary | ICD-10-CM | POA: Diagnosis present

## 2018-06-12 DIAGNOSIS — Z8249 Family history of ischemic heart disease and other diseases of the circulatory system: Secondary | ICD-10-CM | POA: Diagnosis not present

## 2018-06-12 DIAGNOSIS — Z833 Family history of diabetes mellitus: Secondary | ICD-10-CM | POA: Diagnosis not present

## 2018-06-12 DIAGNOSIS — Z882 Allergy status to sulfonamides status: Secondary | ICD-10-CM | POA: Diagnosis not present

## 2018-06-12 DIAGNOSIS — T827XXA Infection and inflammatory reaction due to other cardiac and vascular devices, implants and grafts, initial encounter: Principal | ICD-10-CM | POA: Diagnosis present

## 2018-06-12 DIAGNOSIS — Z8701 Personal history of pneumonia (recurrent): Secondary | ICD-10-CM | POA: Diagnosis not present

## 2018-06-12 DIAGNOSIS — Z818 Family history of other mental and behavioral disorders: Secondary | ICD-10-CM | POA: Diagnosis not present

## 2018-06-12 DIAGNOSIS — Z91041 Radiographic dye allergy status: Secondary | ICD-10-CM | POA: Diagnosis not present

## 2018-06-12 DIAGNOSIS — E875 Hyperkalemia: Secondary | ICD-10-CM | POA: Diagnosis not present

## 2018-06-12 DIAGNOSIS — T82898A Other specified complication of vascular prosthetic devices, implants and grafts, initial encounter: Secondary | ICD-10-CM | POA: Diagnosis not present

## 2018-06-12 DIAGNOSIS — N186 End stage renal disease: Secondary | ICD-10-CM | POA: Diagnosis present

## 2018-06-12 DIAGNOSIS — Z87442 Personal history of urinary calculi: Secondary | ICD-10-CM | POA: Diagnosis not present

## 2018-06-12 DIAGNOSIS — I9589 Other hypotension: Secondary | ICD-10-CM | POA: Diagnosis present

## 2018-06-12 DIAGNOSIS — Z79899 Other long term (current) drug therapy: Secondary | ICD-10-CM

## 2018-06-12 DIAGNOSIS — K219 Gastro-esophageal reflux disease without esophagitis: Secondary | ICD-10-CM | POA: Diagnosis present

## 2018-06-12 DIAGNOSIS — L02416 Cutaneous abscess of left lower limb: Secondary | ICD-10-CM | POA: Diagnosis present

## 2018-06-12 DIAGNOSIS — E1151 Type 2 diabetes mellitus with diabetic peripheral angiopathy without gangrene: Secondary | ICD-10-CM | POA: Diagnosis present

## 2018-06-12 DIAGNOSIS — I120 Hypertensive chronic kidney disease with stage 5 chronic kidney disease or end stage renal disease: Secondary | ICD-10-CM | POA: Diagnosis present

## 2018-06-12 DIAGNOSIS — Z885 Allergy status to narcotic agent status: Secondary | ICD-10-CM | POA: Diagnosis not present

## 2018-06-12 DIAGNOSIS — Z992 Dependence on renal dialysis: Secondary | ICD-10-CM

## 2018-06-12 DIAGNOSIS — Z7902 Long term (current) use of antithrombotics/antiplatelets: Secondary | ICD-10-CM

## 2018-06-12 DIAGNOSIS — G40409 Other generalized epilepsy and epileptic syndromes, not intractable, without status epilepticus: Secondary | ICD-10-CM | POA: Diagnosis present

## 2018-06-12 DIAGNOSIS — Z888 Allergy status to other drugs, medicaments and biological substances status: Secondary | ICD-10-CM | POA: Diagnosis not present

## 2018-06-12 DIAGNOSIS — Z7982 Long term (current) use of aspirin: Secondary | ICD-10-CM

## 2018-06-12 DIAGNOSIS — G8220 Paraplegia, unspecified: Secondary | ICD-10-CM | POA: Diagnosis present

## 2018-06-12 DIAGNOSIS — Z89412 Acquired absence of left great toe: Secondary | ICD-10-CM

## 2018-06-12 DIAGNOSIS — Q059 Spina bifida, unspecified: Secondary | ICD-10-CM

## 2018-06-12 DIAGNOSIS — Z79891 Long term (current) use of opiate analgesic: Secondary | ICD-10-CM

## 2018-06-12 DIAGNOSIS — D631 Anemia in chronic kidney disease: Secondary | ICD-10-CM | POA: Diagnosis present

## 2018-06-12 DIAGNOSIS — E039 Hypothyroidism, unspecified: Secondary | ICD-10-CM | POA: Diagnosis present

## 2018-06-12 DIAGNOSIS — E669 Obesity, unspecified: Secondary | ICD-10-CM | POA: Diagnosis present

## 2018-06-12 DIAGNOSIS — Z79890 Hormone replacement therapy (postmenopausal): Secondary | ICD-10-CM

## 2018-06-12 DIAGNOSIS — Z6837 Body mass index (BMI) 37.0-37.9, adult: Secondary | ICD-10-CM

## 2018-06-12 HISTORY — PX: THROMBECTOMY AND REVISION OF ARTERIOVENTOUS (AV) GORETEX  GRAFT: SHX6120

## 2018-06-12 LAB — CBC WITH DIFFERENTIAL/PLATELET
Abs Immature Granulocytes: 0.01 10*3/uL (ref 0.00–0.07)
Basophils Absolute: 0 10*3/uL (ref 0.0–0.1)
Basophils Relative: 1 %
Eosinophils Absolute: 0.1 10*3/uL (ref 0.0–0.5)
Eosinophils Relative: 2 %
HCT: 38.5 % — ABNORMAL LOW (ref 39.0–52.0)
Hemoglobin: 12.1 g/dL — ABNORMAL LOW (ref 13.0–17.0)
Immature Granulocytes: 0 %
Lymphocytes Relative: 12 %
Lymphs Abs: 0.5 10*3/uL — ABNORMAL LOW (ref 0.7–4.0)
MCH: 31.3 pg (ref 26.0–34.0)
MCHC: 31.4 g/dL (ref 30.0–36.0)
MCV: 99.5 fL (ref 80.0–100.0)
Monocytes Absolute: 0.4 10*3/uL (ref 0.1–1.0)
Monocytes Relative: 10 %
Neutro Abs: 3.2 10*3/uL (ref 1.7–7.7)
Neutrophils Relative %: 75 %
Platelets: 123 10*3/uL — ABNORMAL LOW (ref 150–400)
RBC: 3.87 MIL/uL — ABNORMAL LOW (ref 4.22–5.81)
RDW: 14.6 % (ref 11.5–15.5)
WBC: 4.3 10*3/uL (ref 4.0–10.5)
nRBC: 0 % (ref 0.0–0.2)

## 2018-06-12 LAB — COMPREHENSIVE METABOLIC PANEL
ALT: 13 U/L (ref 0–44)
AST: 15 U/L (ref 15–41)
Albumin: 3.9 g/dL (ref 3.5–5.0)
Alkaline Phosphatase: 56 U/L (ref 38–126)
Anion gap: 14 (ref 5–15)
BUN: 26 mg/dL — ABNORMAL HIGH (ref 6–20)
CO2: 27 mmol/L (ref 22–32)
Calcium: 9.3 mg/dL (ref 8.9–10.3)
Chloride: 94 mmol/L — ABNORMAL LOW (ref 98–111)
Creatinine, Ser: 5.79 mg/dL — ABNORMAL HIGH (ref 0.61–1.24)
GFR calc Af Amer: 12 mL/min — ABNORMAL LOW (ref >60)
GFR calc non Af Amer: 10 mL/min — ABNORMAL LOW (ref >60)
Glucose, Bld: 79 mg/dL (ref 70–99)
Potassium: 4.2 mmol/L (ref 3.5–5.1)
Sodium: 135 mmol/L (ref 135–145)
Total Bilirubin: 0.8 mg/dL (ref 0.3–1.2)
Total Protein: 9 g/dL — ABNORMAL HIGH (ref 6.5–8.1)

## 2018-06-12 LAB — LACTIC ACID, PLASMA
Lactic Acid, Venous: 1 mmol/L (ref 0.5–1.9)
Lactic Acid, Venous: 1.3 mmol/L (ref 0.5–1.9)

## 2018-06-12 SURGERY — THROMBECTOMY AND REVISION OF ARTERIOVENTOUS (AV) GORETEX  GRAFT
Anesthesia: General | Site: Leg Upper | Laterality: Left

## 2018-06-12 MED ORDER — PROPOFOL 10 MG/ML IV BOLUS
INTRAVENOUS | Status: AC
  Filled 2018-06-12: qty 20

## 2018-06-12 MED ORDER — HYDROMORPHONE HCL 1 MG/ML IJ SOLN
INTRAMUSCULAR | Status: AC
  Filled 2018-06-12: qty 1

## 2018-06-12 MED ORDER — EPHEDRINE SULFATE 50 MG/ML IJ SOLN
INTRAMUSCULAR | Status: DC | PRN
  Administered 2018-06-12: 10 mg via INTRAVENOUS
  Administered 2018-06-12 (×4): 5 mg via INTRAVENOUS

## 2018-06-12 MED ORDER — ZOLPIDEM TARTRATE 5 MG PO TABS
10.0000 mg | ORAL_TABLET | Freq: Every day | ORAL | Status: DC
  Administered 2018-06-13 – 2018-06-14 (×3): 10 mg via ORAL
  Filled 2018-06-12 (×3): qty 2

## 2018-06-12 MED ORDER — SODIUM CHLORIDE 0.9 % IV SOLN
INTRAVENOUS | Status: DC | PRN
  Administered 2018-06-12: 500 mL

## 2018-06-12 MED ORDER — CEFAZOLIN SODIUM-DEXTROSE 2-4 GM/100ML-% IV SOLN
2.0000 g | Freq: Once | INTRAVENOUS | Status: AC
  Administered 2018-06-12: 2 g via INTRAVENOUS
  Filled 2018-06-12: qty 100

## 2018-06-12 MED ORDER — FAMOTIDINE 20 MG PO TABS
20.0000 mg | ORAL_TABLET | Freq: Every day | ORAL | Status: DC
  Administered 2018-06-13 – 2018-06-15 (×3): 20 mg via ORAL
  Filled 2018-06-12 (×4): qty 1

## 2018-06-12 MED ORDER — EPHEDRINE 5 MG/ML INJ
INTRAVENOUS | Status: AC
  Filled 2018-06-12: qty 20

## 2018-06-12 MED ORDER — ONDANSETRON HCL 4 MG/2ML IJ SOLN
INTRAMUSCULAR | Status: AC
  Filled 2018-06-12: qty 2

## 2018-06-12 MED ORDER — SUCCINYLCHOLINE CHLORIDE 20 MG/ML IJ SOLN
INTRAMUSCULAR | Status: DC | PRN
  Administered 2018-06-12: 120 mg via INTRAVENOUS

## 2018-06-12 MED ORDER — LIDOCAINE 2% (20 MG/ML) 5 ML SYRINGE
INTRAMUSCULAR | Status: AC
  Filled 2018-06-12: qty 5

## 2018-06-12 MED ORDER — PROMETHAZINE HCL 25 MG PO TABS
25.0000 mg | ORAL_TABLET | Freq: Two times a day (BID) | ORAL | Status: DC | PRN
  Filled 2018-06-12: qty 1

## 2018-06-12 MED ORDER — SODIUM ZIRCONIUM CYCLOSILICATE 10 G PO PACK: 10.0000 g | PACK | Freq: Every day | ORAL | Status: DC

## 2018-06-12 MED ORDER — HYDROMORPHONE HCL 1 MG/ML IJ SOLN
0.2500 mg | INTRAMUSCULAR | Status: DC | PRN
  Administered 2018-06-12 (×4): 0.5 mg via INTRAVENOUS

## 2018-06-12 MED ORDER — SODIUM POLYSTYRENE SULFONATE PO POWD
15.0000 g | ORAL | Status: DC
  Administered 2018-06-15: 15 g via ORAL
  Filled 2018-06-12: qty 15

## 2018-06-12 MED ORDER — ONDANSETRON HCL 4 MG/2ML IJ SOLN
4.0000 mg | Freq: Four times a day (QID) | INTRAMUSCULAR | Status: DC | PRN
  Administered 2018-06-12 – 2018-06-14 (×2): 4 mg via INTRAVENOUS
  Filled 2018-06-12 (×2): qty 2

## 2018-06-12 MED ORDER — 0.9 % SODIUM CHLORIDE (POUR BTL) OPTIME
TOPICAL | Status: DC | PRN
  Administered 2018-06-12: 16:00:00 1000 mL

## 2018-06-12 MED ORDER — FAMOTIDINE 20 MG PO TABS: 40.0000 mg | ORAL_TABLET | Freq: Every day | ORAL | Status: DC

## 2018-06-12 MED ORDER — ONDANSETRON HCL 4 MG/2ML IJ SOLN
4.0000 mg | Freq: Once | INTRAMUSCULAR | Status: AC | PRN
  Administered 2018-06-12: 4 mg via INTRAVENOUS

## 2018-06-12 MED ORDER — FLUTICASONE PROPIONATE 50 MCG/ACT NA SUSP
2.0000 | NASAL | Status: DC | PRN
  Filled 2018-06-12: qty 16

## 2018-06-12 MED ORDER — SODIUM CHLORIDE 0.9% FLUSH: 3.0000 mL | Freq: Once | INTRAVENOUS | Status: DC

## 2018-06-12 MED ORDER — LIDOCAINE HCL (CARDIAC) PF 100 MG/5ML IV SOSY
PREFILLED_SYRINGE | INTRAVENOUS | Status: DC | PRN
  Administered 2018-06-12: 60 mg via INTRATRACHEAL

## 2018-06-12 MED ORDER — ASPIRIN EC 325 MG PO TBEC
325.0000 mg | DELAYED_RELEASE_TABLET | Freq: Every day | ORAL | Status: DC
  Administered 2018-06-13 – 2018-06-15 (×3): 325 mg via ORAL
  Filled 2018-06-12 (×4): qty 1

## 2018-06-12 MED ORDER — METOPROLOL TARTRATE 5 MG/5ML IV SOLN: 2.0000 mg | INTRAVENOUS | Status: DC | PRN

## 2018-06-12 MED ORDER — PHENYLEPHRINE HCL (PRESSORS) 10 MG/ML IV SOLN
INTRAVENOUS | Status: DC | PRN
  Administered 2018-06-12 (×2): 40 ug via INTRAVENOUS

## 2018-06-12 MED ORDER — MORPHINE SULFATE (PF) 2 MG/ML IV SOLN
2.0000 mg | INTRAVENOUS | Status: DC | PRN
  Administered 2018-06-12 – 2018-06-15 (×6): 2 mg via INTRAVENOUS
  Filled 2018-06-12 (×6): qty 1

## 2018-06-12 MED ORDER — LORAZEPAM 2 MG/ML IJ SOLN
0.5000 mg | Freq: Once | INTRAMUSCULAR | Status: AC
  Administered 2018-06-12: 0.5 mg via INTRAVENOUS
  Filled 2018-06-12: qty 1

## 2018-06-12 MED ORDER — SUCCINYLCHOLINE CHLORIDE 200 MG/10ML IV SOSY
PREFILLED_SYRINGE | INTRAVENOUS | Status: AC
  Filled 2018-06-12: qty 10

## 2018-06-12 MED ORDER — PANTOPRAZOLE SODIUM 40 MG PO TBEC
40.0000 mg | DELAYED_RELEASE_TABLET | Freq: Every day | ORAL | Status: DC
  Administered 2018-06-13 – 2018-06-15 (×4): 40 mg via ORAL
  Filled 2018-06-12 (×4): qty 1

## 2018-06-12 MED ORDER — LABETALOL HCL 5 MG/ML IV SOLN: 10.0000 mg | INTRAVENOUS | Status: DC | PRN

## 2018-06-12 MED ORDER — OXYCODONE-ACETAMINOPHEN 10-325 MG PO TABS: 1.0000 | ORAL_TABLET | Freq: Three times a day (TID) | ORAL | Status: DC

## 2018-06-12 MED ORDER — FENTANYL CITRATE (PF) 250 MCG/5ML IJ SOLN
INTRAMUSCULAR | Status: AC
  Filled 2018-06-12: qty 5

## 2018-06-12 MED ORDER — SODIUM CHLORIDE 0.9 % IV SOLN
INTRAVENOUS | Status: AC
  Filled 2018-06-12: qty 1.2

## 2018-06-12 MED ORDER — MIDODRINE HCL 5 MG PO TABS
5.0000 mg | ORAL_TABLET | Freq: Three times a day (TID) | ORAL | Status: DC
  Filled 2018-06-12 (×9): qty 1

## 2018-06-12 MED ORDER — OXYCODONE HCL 5 MG PO TABS
5.0000 mg | ORAL_TABLET | Freq: Three times a day (TID) | ORAL | Status: DC
  Administered 2018-06-13 – 2018-06-15 (×9): 5 mg via ORAL
  Filled 2018-06-12 (×9): qty 1

## 2018-06-12 MED ORDER — METHOCARBAMOL 500 MG PO TABS: 500.0000 mg | ORAL_TABLET | Freq: Four times a day (QID) | ORAL | Status: DC | PRN

## 2018-06-12 MED ORDER — VANCOMYCIN HCL 10 G IV SOLR
2000.0000 mg | Freq: Once | INTRAVENOUS | Status: AC
  Administered 2018-06-12: 2000 mg via INTRAVENOUS
  Filled 2018-06-12: qty 2000

## 2018-06-12 MED ORDER — MIDAZOLAM HCL 2 MG/2ML IJ SOLN
INTRAMUSCULAR | Status: AC
  Filled 2018-06-12: qty 2

## 2018-06-12 MED ORDER — FENTANYL CITRATE (PF) 250 MCG/5ML IJ SOLN
INTRAMUSCULAR | Status: DC | PRN
  Administered 2018-06-12 (×2): 100 ug via INTRAVENOUS
  Administered 2018-06-12: 50 ug via INTRAVENOUS

## 2018-06-12 MED ORDER — CEFAZOLIN SODIUM-DEXTROSE 2-3 GM-%(50ML) IV SOLR
INTRAVENOUS | Status: DC | PRN
  Administered 2018-06-12: 2 g via INTRAVENOUS

## 2018-06-12 MED ORDER — LEVOTHYROXINE SODIUM 25 MCG PO TABS
125.0000 ug | ORAL_TABLET | Freq: Every day | ORAL | Status: DC
  Administered 2018-06-13 – 2018-06-15 (×3): 125 ug via ORAL
  Filled 2018-06-12 (×5): qty 1

## 2018-06-12 MED ORDER — SODIUM CHLORIDE (PF) 0.9 % IJ SOLN
INTRAMUSCULAR | Status: AC
  Filled 2018-06-12: qty 10

## 2018-06-12 MED ORDER — ARTIFICIAL TEARS OPHTHALMIC OINT
TOPICAL_OINTMENT | OPHTHALMIC | Status: AC
  Filled 2018-06-12: qty 3.5

## 2018-06-12 MED ORDER — SODIUM CHLORIDE 0.9 % IV SOLN
INTRAVENOUS | Status: DC
  Administered 2018-06-12: 21:00:00 via INTRAVENOUS

## 2018-06-12 MED ORDER — HYDRALAZINE HCL 20 MG/ML IJ SOLN: 5.0000 mg | INTRAMUSCULAR | Status: DC | PRN

## 2018-06-12 MED ORDER — OXYCODONE-ACETAMINOPHEN 5-325 MG PO TABS
1.0000 | ORAL_TABLET | Freq: Three times a day (TID) | ORAL | Status: DC
  Administered 2018-06-13 – 2018-06-15 (×9): 1 via ORAL
  Filled 2018-06-12 (×9): qty 1

## 2018-06-12 MED ORDER — LIDOCAINE-EPINEPHRINE 0.5 %-1:200000 IJ SOLN
INTRAMUSCULAR | Status: AC
  Filled 2018-06-12: qty 1

## 2018-06-12 MED ORDER — PROPOFOL 10 MG/ML IV BOLUS
INTRAVENOUS | Status: DC | PRN
  Administered 2018-06-12: 80 mg via INTRAVENOUS
  Administered 2018-06-12: 160 mg via INTRAVENOUS
  Administered 2018-06-12: 80 mg via INTRAVENOUS

## 2018-06-12 MED ORDER — MEPERIDINE HCL 25 MG/ML IJ SOLN: 6.2500 mg | INTRAMUSCULAR | Status: DC | PRN

## 2018-06-12 MED ORDER — HEPARIN SODIUM (PORCINE) 5000 UNIT/ML IJ SOLN
5000.0000 [IU] | Freq: Three times a day (TID) | INTRAMUSCULAR | Status: DC
  Administered 2018-06-13: 5000 [IU] via SUBCUTANEOUS
  Filled 2018-06-12: qty 1

## 2018-06-12 MED ORDER — PHENYLEPHRINE 40 MCG/ML (10ML) SYRINGE FOR IV PUSH (FOR BLOOD PRESSURE SUPPORT)
PREFILLED_SYRINGE | INTRAVENOUS | Status: AC
  Filled 2018-06-12: qty 10

## 2018-06-12 MED ORDER — TRAZODONE HCL 100 MG PO TABS
100.0000 mg | ORAL_TABLET | Freq: Every day | ORAL | Status: DC
  Administered 2018-06-13 – 2018-06-14 (×3): 100 mg via ORAL
  Filled 2018-06-12 (×3): qty 1

## 2018-06-12 MED ORDER — SODIUM CHLORIDE 0.9 % IV SOLN
INTRAVENOUS | Status: DC | PRN
  Administered 2018-06-12: 18:00:00 via INTRAVENOUS

## 2018-06-12 MED ORDER — MIDAZOLAM HCL 5 MG/5ML IJ SOLN
INTRAMUSCULAR | Status: DC | PRN
  Administered 2018-06-12: 2 mg via INTRAVENOUS

## 2018-06-12 SURGICAL SUPPLY — 37 items
ARMBAND PINK RESTRICT EXTREMIT (MISCELLANEOUS) ×4 IMPLANT
BIOPATCH RED 1 DISK 7.0 (GAUZE/BANDAGES/DRESSINGS) ×2 IMPLANT
CANISTER SUCT 3000ML PPV (MISCELLANEOUS) ×2 IMPLANT
CANNULA VESSEL 3MM 2 BLNT TIP (CANNULA) ×2 IMPLANT
CATH EMB 4FR 80CM (CATHETERS) ×2 IMPLANT
CLIP LIGATING EXTRA MED SLVR (CLIP) ×2 IMPLANT
CLIP LIGATING EXTRA SM BLUE (MISCELLANEOUS) ×2 IMPLANT
COVER WAND RF STERILE (DRAPES) ×2 IMPLANT
DECANTER SPIKE VIAL GLASS SM (MISCELLANEOUS) ×2 IMPLANT
DERMABOND ADVANCED (GAUZE/BANDAGES/DRESSINGS) ×1
DERMABOND ADVANCED .7 DNX12 (GAUZE/BANDAGES/DRESSINGS) ×1 IMPLANT
DRSG PAD ABDOMINAL 8X10 ST (GAUZE/BANDAGES/DRESSINGS) ×2 IMPLANT
ELECT REM PT RETURN 9FT ADLT (ELECTROSURGICAL) ×2
ELECTRODE REM PT RTRN 9FT ADLT (ELECTROSURGICAL) ×1 IMPLANT
GAUZE SPONGE 2X2 8PLY STRL LF (GAUZE/BANDAGES/DRESSINGS) ×1 IMPLANT
GAUZE SPONGE 4X4 12PLY STRL (GAUZE/BANDAGES/DRESSINGS) ×2 IMPLANT
GLOVE SS BIOGEL STRL SZ 7.5 (GLOVE) ×1 IMPLANT
GLOVE SUPERSENSE BIOGEL SZ 7.5 (GLOVE) ×1
GOWN STRL REUS W/ TWL LRG LVL3 (GOWN DISPOSABLE) ×3 IMPLANT
GOWN STRL REUS W/TWL LRG LVL3 (GOWN DISPOSABLE) ×3
KIT BASIN OR (CUSTOM PROCEDURE TRAY) ×2 IMPLANT
KIT TURNOVER KIT B (KITS) ×2 IMPLANT
NS IRRIG 1000ML POUR BTL (IV SOLUTION) ×2 IMPLANT
PACK CV ACCESS (CUSTOM PROCEDURE TRAY) ×2 IMPLANT
PAD ARMBOARD 7.5X6 YLW CONV (MISCELLANEOUS) ×4 IMPLANT
SPONGE GAUZE 2X2 STER 10/PKG (GAUZE/BANDAGES/DRESSINGS) ×1
SUT ETHILON 3 0 PS 1 (SUTURE) ×6 IMPLANT
SUT PROLENE 6 0 CC (SUTURE) ×2 IMPLANT
SUT VIC AB 3-0 SH 27 (SUTURE) ×2
SUT VIC AB 3-0 SH 27X BRD (SUTURE) ×2 IMPLANT
SWAB COLLECTION DEVICE MRSA (MISCELLANEOUS) ×2 IMPLANT
SWAB CULTURE ESWAB REG 1ML (MISCELLANEOUS) ×2 IMPLANT
TAPE CLOTH SURG 4X10 WHT LF (GAUZE/BANDAGES/DRESSINGS) ×2 IMPLANT
TAPE STRIPS DRAPE STRL (GAUZE/BANDAGES/DRESSINGS) ×2 IMPLANT
TOWEL GREEN STERILE (TOWEL DISPOSABLE) ×2 IMPLANT
UNDERPAD 30X30 (UNDERPADS AND DIAPERS) ×2 IMPLANT
WATER STERILE IRR 1000ML POUR (IV SOLUTION) ×2 IMPLANT

## 2018-06-12 NOTE — Progress Notes (Signed)
Report attempted x2

## 2018-06-12 NOTE — Progress Notes (Signed)
Pt refusing PO medications at this time due to being nauseated. Zofran given. Will retry at a later time.

## 2018-06-12 NOTE — ED Provider Notes (Signed)
  Face-to-face evaluation   History: He presents for evaluation of worsening pain swelling, and drainage from a nonfunctional graft in his left proximal thigh.  He denies fever, chills, vomiting or dizziness.  He dialyzed today, through a vascular catheter of the right thigh.  Physical exam: Heart, overweight, cooperative.  Left leg with draining wound, foul-smelling, proximal anterior thigh, with associated induration and tenderness area approximately 10 x 5 cm.  Medical screening examination/treatment/procedure(s) were conducted as a shared visit with non-physician practitioner(s) and myself.  I personally evaluated the patient during the encounter    Daleen Bo, MD 06/14/18 2207

## 2018-06-12 NOTE — ED Notes (Signed)
ED TO INPATIENT HANDOFF REPORT  ED Nurse Name and Phone #: [phone]  S Name/Age/Gender [name] 53 y.o. male Room/Bed: 039C/039C  Code Status   Code Status: Prior  Home/SNF/Other Home Patient oriented to: self, place, time and situation Is this baseline? Yes   Triage Complete: Triage complete  Chief Complaint Wound Concerns  Triage Note Pt in with L thigh wound, cultured today by his doctor and told to come to ER. 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   Allergies Allergies  Allergen Reactions  . Furadantin [Nitrofurantoin] Other (See Comments)    UNSPECIFIED REACTION   . Mandelamine [Methenamine] Other (See Comments)    UNSPECIFIED REACTION   . Noroxin [Norfloxacin] Other (See Comments)    UNSPECIFIED REACTION   . Carmine Nausea Only  . Contrast Media [Iodinated Diagnostic Agents] Nausea And Vomiting    Oral dye causes vomiting, IV dye is okay  . Hydrocodone Other (See Comments)    Caused involuntary movement and twitching. CAN NOT TAKE DUE TO MUSCLE SPASMS AND MUSCLE TREMORS  . Metrizamide Nausea And Vomiting    Oral dye causes vomiting, IV dye is okay  . Sulfa Antibiotics Cough    Childhood reaction - pt could not confirm that it was a cough  . Sulfur Cough    Childhood reaction - pt could not confirm that it was a cough    Level of Care/Admitting Diagnosis ED Disposition    ED Disposition Condition Comment   Admit  The patient appears reasonably stabilized for admission considering the current resources, flow, and capabilities available in the ED at this time, and I doubt any other [facility] requiring further screening and/or treatment in the ED prior to admission is  present.       B Medical/Surgery History Past Medical History:  Diagnosis Date  . Anemia   . Anxiety   . Constipation   . End stage renal failure on dialysis [facility])    "[place]; MWF" (02/29/2016)  . GERD (gastroesophageal reflux disease)   . Grand mal seizure ([place]) X 4   last  1998 (02/29/2016)  . Heart murmur    no problems with per pt. - nephrologist and Dr. Coletta Memos  . History of blood transfusion 2000s   "I was having blood loss; never found out from where"  . History of kidney stones   . Hypertension    hx of - has not taken bp meds in over 2 years  . Hypothyroidism   . Insomnia   . Migraine    "due to my BP in my late 1990s; none since" (02/29/2016)  . Nonhealing surgical wound    of the left arteriovenous graft  . Pneumonia    x 2  . Shortness of breath    rarely  . Spina bifida [facility])    Past Surgical History:  Procedure Laterality Date  . AMPUTATION Left 12/11/2017   Procedure: Left Great Toe Amputation;  Surgeon: [name], MD;  Location: [place];  Service: Orthopedics;  Laterality: Left;  . APPENDECTOMY    . ARTERIOVENOUS GRAFT PLACEMENT Left 10/16/2012   left femoral goretex graft         Dr Donnetta Hutching  . AV FISTULA PLACEMENT Left 06/27/2012   Procedure: EXPLORATORY LEFT THY-GRAFT PSEUDO-ANEURYSM;  Surgeon: Conrad [place], MD;  Location: [place];  Service: Vascular;  Laterality: Left;  Revision of left Arteriovenus gortex graft in thigh.  . COLONOSCOPY    . I&D EXTREMITY Left 02/29/2016  Procedure: DEBRIDEMENT LEFT THIGH WOUND;  Surgeon: Waynetta Sandy, MD;  Location: [place];  Service: Vascular;  Laterality: Left;  . ILEOSTOMY  1970s  . INCISION AND DRAINAGE Left 10/16/2012   Procedure: INCISION AND Debridement left thigh graft;  Surgeon: Rosetta Posner, MD;  Location: [place];  Service: Vascular;  Laterality: Left;  . INSERTION OF DIALYSIS CATHETER    . INSERTION OF DIALYSIS CATHETER  01/14/2016   Procedure: INSERTION OF DIALYSIS CATHETER;  Surgeon: Waynetta Sandy, MD;  Location: [place];  Service: Vascular;;  . INSERTION OF DIALYSIS CATHETER Right 08/07/2017   Procedure: INSERTION OF DIALYSIS CATHETER;  Surgeon: Waynetta Sandy, MD;  Location: [place];  Service: Vascular;  Laterality: Right;  . INSERTION OF DIALYSIS CATHETER  Right 12/21/2017   Procedure: RIGHT FEMORAL DIALYSIS CATHETER EXCHANGE;  Surgeon: Marty Heck, MD;  Location: [place];  Service: Vascular;  Laterality: Right;  . INSERTION OF ILIAC STENT Left 01/14/2016   Procedure: INSERTION OF ILIAC STENT;  Surgeon: Waynetta Sandy, MD;  Location: [place];  Service: Vascular;  Laterality: Left;  . INTRAOPERATIVE ARTERIOGRAM Left 01/14/2016   Procedure: INTRA OPERATIVE ARTERIOGRAM;  Surgeon: Waynetta Sandy, MD;  Location: [place];  Service: Vascular;  Laterality: Left;  . LITHOTRIPSY  x 3   "& a laser treatment" (02/29/2016)  . NEPHROSTOMY Bilateral 1968  . PATELLAR TENDON REPAIR Right 1990s   "big incision"  . PERIPHERAL VASCULAR CATHETERIZATION  01/14/2016   Procedure: A/V SHUNTOGRAM;  Surgeon: Waynetta Sandy, MD;  Location: [place];  Service: Vascular;;  . REVISION OF ARTERIOVENOUS GORETEX GRAFT Left 10/16/2012   Procedure: REVISION OF LEFT FEMORAL LOOP ARTERIOVENOUS GORETEX GRAFT;  Surgeon: Rosetta Posner, MD;  Location: [place];  Service: Vascular;  Laterality: Left;  . REVISION OF ARTERIOVENOUS GORETEX GRAFT Left 02/11/2015   Procedure: EXCISION OF SMALL SEGMENT OF EXPOSED LEFT THIGH NON FUNCTIONING  ARTERIOVENOUS GORETEX GRAFT;  Surgeon: Mal Misty, MD;  Location: [place];  Service: Vascular;  Laterality: Left;  . REVISION OF ARTERIOVENOUS GORETEX GRAFT Left 01/14/2016   Procedure: REVISION OF ARTERIOVENOUS GORETEX GRAFT;  Surgeon: Waynetta Sandy, MD;  Location: [place];  Service: Vascular;  Laterality: Left;  . REVISION OF ARTERIOVENOUS GORETEX GRAFT Left 02/29/2016   thigh/pt report  . REVISION OF ARTERIOVENOUS GORETEX GRAFT Left 02/29/2016   Procedure: POSSIBLE REVISION OF LEFT THIGH ARTERIOVENOUS GORETEX GRAFT;  Surgeon: Waynetta Sandy, MD;  Location: [place];  Service: Vascular;  Laterality: Left;  . TEE WITHOUT CARDIOVERSION N/A 12/14/2017   Procedure: TRANSESOPHAGEAL ECHOCARDIOGRAM (TEE);  Surgeon: Acie Fredrickson  Wonda Cheng, MD;  Location: [facility] ENDOSCOPY;  Service: Cardiovascular;  Laterality: N/A;  . THROMBECTOMY W/ EMBOLECTOMY Left 01/14/2016   Procedure: THROMBECTOMY ARTERIOVENOUS GORE-TEX Left thigh GRAFT;  Surgeon: Waynetta Sandy, MD;  Location: [place];  Service: Vascular;  Laterality: Left;  . TONGUE SURGERY  ~ 1990   tongue-tie release   . ULTRASOUND GUIDANCE FOR VASCULAR ACCESS Right 08/07/2017   Procedure: ULTRASOUND GUIDANCE FOR VASCULAR CANNULATION RIGHT FEMORAL VEIN AND LEFT AV FEMORAL GRAFT;  Surgeon: Waynetta Sandy, MD;  Location: [place];  Service: Vascular;  Laterality: Right;  . UPPER EXTREMITY VENOGRAPHY Bilateral 08/09/2017   Procedure: UPPER EXTREMITY VENOGRAPHY;  Surgeon: Serafina Mitchell, MD;  Location: [place] CV [facility];  Service: Cardiovascular;  Laterality: Bilateral;  . VENOGRAM N/A 09/20/2017   Procedure: RIGHT COMMON FEMORAL ARTERY EXPLORATION.  CANNULATION RIGHT COMMON FEMORAL VEIN. VENOGRAM CENTRAL ULTRA SOUND  GUIDED RIGHT FEMORAL VEIN TIMES TWO.;  Surgeon: Waynetta Sandy, MD;  Location: [place];  Service: Vascular;  Laterality: N/A;  . WOUND DEBRIDEMENT Left 02/29/2016   thigh     A IV Location/Drains/Wounds Patient Lines/Drains/Airways Status   Active Line/Drains/Airways    Name:   Placement date:   Placement time:   Site:   Days:   Peripheral IV 06/12/18 Right Antecubital   06/12/18    1440    Antecubital   less than 1   Hemodialysis Catheter Right Femoral vein Double-lumen;Temporary   12/21/17    1936    Femoral vein   173   Ileostomy RLQ   -    -    RLQ      Incision (Closed) 12/21/17 Groin Right   12/21/17    1849     173   Wound / Incision (Open or Dehisced) 12/08/17 Non-pressure wound Foot Left left great toe necrosis with purple surrounding skin and left foot calus noted   12/08/17    0010    Foot   186   Wound / Incision (Open or Dehisced) 12/08/17 Other (Comment) Back Right small pin hole open to right back with scant amount of drainage noted    12/08/17    0010    Back   186          Intake/Output Last 24 hours No intake or output data in the 24 hours ending 06/12/18 1513  Labs/Imaging Results for orders placed or performed during the hospital encounter of 06/12/18 (from the past 48 hour(s))  Lactic acid, plasma     Status: None   Collection Time: 06/12/18 12:24 PM  Result Value Ref Range   Lactic Acid, Venous 1.0 0.5 - 1.9 mmol/L    Comment: Performed at [facility], [place] [address], [place], [place] [address]  Comprehensive metabolic panel     Status: Abnormal   Collection Time: 06/12/18 12:24 PM  Result Value Ref Range   Sodium 135 135 - 145 mmol/L   Potassium 4.2 3.5 - 5.1 mmol/L   Chloride 94 (L) 98 - 111 mmol/L   CO2 27 22 - 32 mmol/L   Glucose, Bld 79 70 - 99 mg/dL   BUN 26 (H) 6 - 20 mg/dL   Creatinine, Ser 5.79 (H) 0.61 - 1.24 mg/dL   Calcium 9.3 8.9 - 10.3 mg/dL   Total Protein 9.0 (H) 6.5 - 8.1 g/dL   Albumin 3.9 3.5 - 5.0 g/dL   AST 15 15 - 41 U/L   ALT 13 0 - 44 U/L   Alkaline Phosphatase 56 38 - 126 U/L   Total Bilirubin 0.8 0.3 - 1.2 mg/dL   GFR calc non Af Amer 10 (L) >60 mL/min   GFR calc Af Amer 12 (L) >60 mL/min   Anion gap 14 5 - 15    Comment: Performed at [facility], [place] [address], [place],  [address]  CBC with Differential     Status: Abnormal   Collection Time: 06/12/18 12:24 PM  Result Value Ref Range   WBC 4.3 4.0 - 10.5 K/uL   RBC 3.87 (L) 4.22 - 5.81 MIL/uL   Hemoglobin 12.1 (L) 13.0 - 17.0 g/dL   HCT 38.5 (L) 39.0 - 52.0 %   MCV 99.5 80.0 - 100.0 fL   MCH 31.3 26.0 - 34.0 pg   MCHC 31.4 30.0 - 36.0 g/dL   RDW 14.6 11.5 - 15.5 %  Platelets 123 (L) 150 - 400 K/uL   nRBC 0.0 0.0 - 0.2 %   Neutrophils Relative % 75 %   Neutro Abs 3.2 1.7 - 7.7 K/uL   Lymphocytes Relative 12 %   Lymphs Abs 0.5 (L) 0.7 - 4.0 K/uL   Monocytes Relative 10 %   Monocytes Absolute 0.4 0.1 - 1.0 K/uL   Eosinophils Relative 2 %   Eosinophils Absolute 0.1 0.0 - 0.5 K/uL    Basophils Relative 1 %   Basophils Absolute 0.0 0.0 - 0.1 K/uL   Immature Granulocytes 0 %   Abs Immature Granulocytes 0.01 0.00 - 0.07 K/uL    Comment: Performed at [place] [address] [place], [place] [address]   No results found.  Pending Labs Unresulted Labs (From admission, onward)    Start     Ordered   06/12/18 1410  Novel Coronavirus,NAA,(SEND-OUT TO REF LAB - TAT 24-48 hrs); Hosp Order  (Asymptomatic Patients Labs)  Once,   R    Question:  Rule Out  Answer:  Yes   06/12/18 1409   06/12/18 1210  Lactic acid, plasma  Now then every 2 hours,   STAT     06/12/18 1209   06/12/18 1210  Urinalysis, Routine w reflex microscopic  ONCE - STAT,   STAT     06/12/18 1209   Signed and Held  CBC  Once,   R     Signed and Held   Signed and Held  Protime-INR  Once,   R     Signed and Held   Signed and Held  CBC  (heparin)  Once,   R    Comments:  Baseline for heparin therapy IF NOT ALREADY DRAWN.  Notify MD if PLT < 100 K.    Signed and Held   Signed and Held  Creatinine, serum  (heparin)  Once,   R    Comments:  Baseline for heparin therapy IF NOT ALREADY DRAWN.    Signed and Held   Signed and Held  Basic metabolic panel  Once,   R     Signed and Held          Vitals/Pain Today's Vitals   06/12/18 1205 06/12/18 1210  BP: 108/68   Pulse: 67   Resp: 18   Temp: 99 F (37.2 C)   TempSrc: Oral   SpO2: 96%   Weight:  122.9 kg    Isolation Precautions No active isolations  Medications Medications  sodium chloride flush (NS) 0.9 % injection 3 mL (has no administration in time range)  ceFAZolin (ANCEF) IVPB 2g/100 mL premix (2 g Intravenous New Bag/Given 06/12/18 1455)  LORazepam (ATIVAN) injection 0.5 mg (0.5 mg Intravenous Given 06/12/18 1455)    Mobility manual wheelchair     Focused Assessments    R Recommendations: See Admitting Provider Note  Report given to:   Additional Notes:

## 2018-06-12 NOTE — Progress Notes (Signed)
Pharmacy Antibiotic Note  [name] is a 53 y.o. male admitted on 06/12/2018 with infected thigh wound at site of old graft.  Now s/p OR for removal.  Pharmacy has been consulted for vancomycin dosing.  Had HD outpatient earlier today  Plan: Vancomycin 2g IV x 1 now. F/u plans for next HD to determine next dose.   Height: 5\' 11"  (180.3 cm) Weight: 270 lb 15.1 oz (122.9 kg) IBW/kg (Calculated) : 75.3  Temp (24hrs), Avg:98.1 F (36.7 C), Min:97 F (36.1 C), Max:99.3 F (37.4 C)  Recent Labs  Lab 06/12/18 1224  WBC 4.3  CREATININE 5.79*  LATICACIDVEN 1.0    Estimated Creatinine Clearance: 19.9 mL/min (A) (by C-G formula based on SCr of 5.79 mg/dL (H)).    Allergies  Allergen Reactions  . Furadantin [Nitrofurantoin] Other (See Comments)    UNSPECIFIED REACTION   . Mandelamine [Methenamine] Other (See Comments)    UNSPECIFIED REACTION   . Noroxin [Norfloxacin] Other (See Comments)    UNSPECIFIED REACTION   . Carmine Nausea Only  . Contrast Media [Iodinated Diagnostic Agents] Nausea And Vomiting    Oral dye causes vomiting, IV dye is okay  . Hydrocodone Other (See Comments)    Caused involuntary movement and twitching. CAN NOT TAKE DUE TO MUSCLE SPASMS AND MUSCLE TREMORS  . Metrizamide Nausea And Vomiting    Oral dye causes vomiting, IV dye is okay  . Sulfa Antibiotics Cough    Childhood reaction - pt could not confirm that it was a cough  . Sulfur Cough    Childhood reaction - pt could not confirm that it was a cough    Antimicrobials this admission: Vancomycin 6/10 >   Dose adjustments this admission:  Microbiology results: 6/10 Wound Cx > 6/10 COVID >   Thank you for allowing pharmacy to be a part of this patient's care.  Marguerite Olea, [facility] Clinical Pharmacist Phone [phone]  06/12/2018 9:13 PM

## 2018-06-12 NOTE — ED Notes (Signed)
First set of blood cultures drawn on pt in mini lab with Jaison Petraglia(NT)

## 2018-06-12 NOTE — Anesthesia Preprocedure Evaluation (Addendum)
Anesthesia Evaluation  Patient identified by MRN, date of birth, ID band Patient awake    Reviewed: Allergy & Precautions, NPO status , Patient's Chart, lab work & pertinent test results  Airway Mallampati: II  TM Distance: >3 FB Neck ROM: Full    Dental   Pulmonary    Pulmonary exam normal        Cardiovascular hypertension, Pt. on medications Normal cardiovascular exam     Neuro/Psych Anxiety    GI/Hepatic GERD  Medicated and Controlled,  Endo/Other    Renal/GU ESRF and DialysisRenal disease     Musculoskeletal   Abdominal   Peds  Hematology   Anesthesia Other Findings   Reproductive/Obstetrics                             Anesthesia Physical Anesthesia Plan  ASA: III  Anesthesia Plan: General   Post-op Pain Management:    Induction: Intravenous  PONV Risk Score and Plan: 1  Airway Management Planned: Oral ETT  Additional Equipment:   Intra-op Plan:   Post-operative Plan: Extubation in OR  Informed Consent: I have reviewed the patients History and Physical, chart, labs and discussed the procedure including the risks, benefits and alternatives for the proposed anesthesia with the patient or authorized representative who has indicated his/her understanding and acceptance.       Plan Discussed with: CRNA and Surgeon  Anesthesia Plan Comments:        Anesthesia Quick Evaluation

## 2018-06-12 NOTE — Anesthesia Procedure Notes (Signed)
Procedure Name: Intubation Date/Time: 06/12/2018 5:53 PM Performed by: Clovis Cao, CRNA Pre-anesthesia Checklist: Patient identified, Emergency Drugs available, Suction available, Patient being monitored and Timeout performed Patient Re-evaluated:Patient Re-evaluated prior to induction Oxygen Delivery Method: Circle system utilized Preoxygenation: Pre-oxygenation with 100% oxygen Induction Type: IV induction and Rapid sequence Laryngoscope Size: Miller and 2 Grade View: Grade I Tube type: Oral Tube size: 7.5 mm Number of attempts: 1 Airway Equipment and Method: Stylet Placement Confirmation: ETT inserted through vocal cords under direct vision,  positive ETCO2 and breath sounds checked- equal and bilateral Secured at: 22 cm Tube secured with: Tape Dental Injury: Teeth and Oropharynx as per pre-operative assessment

## 2018-06-12 NOTE — H&P (Signed)
Vascular and Vein Specialist of [facility]  Patient name: [name] MRN: [id number] DOB: 1965-05-21 Sex: male    HPI: [name] is a 53 y.o. male presenting to the emergency room with exposed Gore-Tex graft in his left thigh.  He has a history of end-stage renal disease dating back at least 20 years.  He has had multiple accesses in all 4 extremities.  He has had no further options for arteriovenous access and is currently being dialyzed via a right femoral catheter that courses through a collateral into his cava.  This was felt to be 1 of his last access options.  He presents today with purulent drainage and exposed Gore-Tex graft and a non-functional left thigh Gore-Tex graft.  He has multiple prior grafts in this leg.  All nonfunctional.  Past Medical History:  Diagnosis Date  . Anemia   . Anxiety   . Constipation   . End stage renal failure on dialysis [facility])    "[place]; MWF" (02/29/2016)  . GERD (gastroesophageal reflux disease)   . Grand mal seizure ([place]) X 4   last 1998 (02/29/2016)  . Heart murmur    no problems with per pt. - nephrologist and Dr. Coletta Memos  . History of blood transfusion 2000s   "I was having blood loss; never found out from where"  . History of kidney stones   . Hypertension    hx of - has not taken bp meds in over 2 years  . Hypothyroidism   . Insomnia   . Migraine    "due to my BP in my late 1990s; none since" (02/29/2016)  . Nonhealing surgical wound    of the left arteriovenous graft  . Pneumonia    x 2  . Shortness of breath    rarely  . Spina bifida ([place])     Family History  Problem Relation Age of Onset  . Diabetes Mother   . Hypertension Mother   . Heart disease Mother        before age 63  . Diabetes Father   . Heart attack Father        X's 3  . Diabetes Sister   . Bipolar disorder Sister     SOCIAL HISTORY: Social History   Tobacco Use  . Smoking status: Never Smoker  .  Smokeless tobacco: Never Used  Substance Use Topics  . Alcohol use: Not Currently    Alcohol/week: 0.0 standard drinks    Comment: 02/29/2016 "nothing since  the mid 1990s    Allergies  Allergen Reactions  . Furadantin [Nitrofurantoin] Other (See Comments)    UNSPECIFIED REACTION   . Mandelamine [Methenamine] Other (See Comments)    UNSPECIFIED REACTION   . Noroxin [Norfloxacin] Other (See Comments)    UNSPECIFIED REACTION   . Carmine Nausea Only  . Contrast Media [Iodinated Diagnostic Agents] Nausea And Vomiting    Oral dye causes vomiting, IV dye is okay  . Hydrocodone Other (See Comments)    Caused involuntary movement and twitching. CAN NOT TAKE DUE TO MUSCLE SPASMS AND MUSCLE TREMORS  . Metrizamide Nausea And Vomiting    Oral dye causes vomiting, IV dye is okay  . Sulfa Antibiotics Cough    Childhood reaction - pt could not confirm that it was a cough  . Sulfur Cough    Childhood reaction - pt could not confirm that it was a cough    Current Facility-Administered Medications  Medication Dose Route Frequency Provider Last Rate Last  Dose  . sodium chloride flush (NS) 0.9 % injection 3 mL  3 mL Intravenous Once Dagoberto Ligas, PA-C       Current Outpatient Medications  Medication Sig Dispense Refill  . aspirin 325 MG EC tablet Take 325 mg by mouth daily.    . calcium acetate (PHOSLO) 667 MG capsule Take 3-4 capsules (2,001-2,668 mg total) by mouth See admin instructions. Take 2708 mg by mouth 3 times daily with meals, take 2001 mg by mouth with snacks 450 capsule 0  . clopidogrel (PLAVIX) 75 MG tablet Take 1 tablet (75 mg total) by mouth daily. 30 tablet 0  . famotidine (PEPCID) 40 MG tablet Take 1 tablet (40 mg total) by mouth daily at 2 PM. 30 tablet 0  . fluticasone (FLONASE) 50 MCG/ACT nasal spray Place 2 sprays into both nostrils as needed for allergies or rhinitis (congestion). 0.003 g 0  . levothyroxine (SYNTHROID, LEVOTHROID) 125 MCG tablet Take 1 tablet (125 mcg  total) by mouth daily before breakfast. 30 tablet 0  . oxyCODONE-acetaminophen (PERCOCET) 10-325 MG tablet Take 1 tablet by mouth 3 (three) times daily. 21 tablet 0  . promethazine (PHENERGAN) 25 MG tablet Take 1 tablet (25 mg total) by mouth 2 (two) times daily as needed for nausea or vomiting. 30 tablet 0  . sodium polystyrene (KAYEXALATE) powder Take 15 g by mouth See admin instructions. Take 15 g (mixed in water) by mouth on Saturdays and Sundays 135 g 0  . traZODone (DESYREL) 100 MG tablet Take 1 tablet (100 mg total) by mouth at bedtime. 30 tablet 0  . zolpidem (AMBIEN) 10 MG tablet Take 1 tablet (10 mg total) by mouth at bedtime. 7 tablet 0  . methocarbamol (ROBAXIN) 500 MG tablet Take 1 tablet (500 mg total) by mouth every 6 (six) hours as needed for muscle spasms. (Patient not taking: Reported on 06/12/2018) 30 tablet 0  . midodrine (PROAMATINE) 5 MG tablet Take 1 tablet (5 mg total) by mouth 3 (three) times daily with meals. (Patient not taking: Reported on 06/12/2018) 90 tablet 0  . Nutritional Supplements (NEPRO PO) Take 1 Can by mouth daily.    . Polyethylene Glycol 3350 POWD Take 17 g by mouth daily as needed. [place] 1 CAPFUL (17GM) IN 4-8OZ. OF WATER AND DRINK BY MOUTH DAILY AS NEEDED FOR MILD CONSTIPATION (Patient not taking: Reported on 06/12/2018) 500 g 0  . sodium zirconium cyclosilicate (LOKELMA) 10 g PACK packet Take 10 g by mouth daily. TAKE 10GM PACK ON OFF DIALYSIS DAYS ON TUESDAY ON THURSDAY ON SUNDAY (Patient not taking: Reported on 06/12/2018) 14 packet 0    REVIEW OF SYSTEMS:  [X]  denotes positive finding, [ ]  denotes negative finding Cardiac  Comments:  Chest pain or chest pressure:    Shortness of breath upon exertion:    Short of breath when lying flat:    Irregular heart rhythm:        Vascular    Pain in calf, thigh, or hip brought on by ambulation:    Pain in feet at night that wakes you up from your sleep:     Blood clot in your veins:    Leg swelling:            PHYSICAL EXAM: Vitals:   06/12/18 1205 06/12/18 1210  BP: 108/68   Pulse: 67   Resp: 18   Temp: 99 F (37.2 C)   TempSrc: Oral   SpO2: 96%   Weight:  122.9 kg  GENERAL: The patient is a well-nourished male, in no acute distress. The vital signs are documented above. CARDIOVASCULAR: Exposed graft material in the left thigh with purulent drainage.  Mild surrounding erythema. PULMONARY: There is good air exchange  MUSCULOSKELETAL: There are no major deformities or cyanosis. NEUROLOGIC: No focal weakness or paresthesias are detected. SKIN: There are no ulcers or rashes noted. PSYCHIATRIC: The patient has a normal affect.  DATA:  None  MEDICAL ISSUES: Infected nonfunctional left thigh AV graft.  Will take to the operating room for removal.  Had normal hemodialysis via his catheter today.  Should be ready for discharge tomorrow.    Rosetta Posner, MD FACS V[facility] and Vein Specialists of [facility] Tel [phone] Pager [phone]

## 2018-06-12 NOTE — ED Provider Notes (Signed)
[facility] EMERGENCY DEPARTMENT Provider Note   CSN: [id number] Arrival date & time: 06/12/18  1157    History   Chief Complaint Chief Complaint  Patient presents with  . L thigh wound    HPI [name] is a 53 y.o. male.     The history is provided by the patient and medical records. No language interpreter was used.     53 year old male with history of end-stage renal disease currently undergoing morning with a Friday dialysis, spinal bifida, non-surgical wound, peripheral artery disease history of prior sepsis secondary to diabetic wounds sent here to the ED his doctor's recommendation for evaluation of his left thigh wound.  Patient states that he has an old graft site on his left thigh that has a scab.  2 days ago the scab came off and he noticed some purulent material oozing out of it.  He also endorsed throbbing pain to the affected site mild to moderate in severity.  Pain is nonradiating.  No associated fever chills no chest pain shortness of breath productive cough.  He did finish his full course of dialysis today.  He did mention about the wound to the provider at the dialysis site who recommend patient to come to ER for further care.  While in the ED, vascular surgeon Dr. Donnetta Hutching has seen evaluate patient and told patient that he will be admitted for surgical intervention tomorrow.  Past Medical History:  Diagnosis Date  . Anemia   . Anxiety   . Constipation   . End stage renal failure on dialysis [facility])    "[place]; MWF" (02/29/2016)  . GERD (gastroesophageal reflux disease)   . Grand mal seizure ([place]) X 4   last 1998 (02/29/2016)  . Heart murmur    no problems with per pt. - nephrologist and Dr. Coletta Memos  . History of blood transfusion 2000s   "I was having blood loss; never found out from where"  . History of kidney stones   . Hypertension    hx of - has not taken bp meds in over 2 years  . Hypothyroidism   . Insomnia   . Migraine    "due to my BP in my late 1990s; none since" (02/29/2016)  . Nonhealing surgical wound    of the left arteriovenous graft  . Pneumonia    x 2  . Shortness of breath    rarely  . Spina bifida [facility])     Patient Active Problem List   Diagnosis Date Noted  . PAD (peripheral artery disease) ([place]) 12/22/2017  . Problem with dialysis access ([place]) 12/21/2017  . Anemia 12/21/2017  . Hypothyroidism 12/21/2017  . GERD (gastroesophageal reflux disease) 12/21/2017  . Anxiety 12/21/2017  . Left great toe amputee ([place]) 12/19/2017  . Endocarditis of mitral valve   . Osteomyelitis of toe of left foot ([place])   . MSSA bacteremia 12/08/2017  . Fever   . Right flank pain   . ESRD (end stage renal disease) ([place]) 09/20/2017  . ESRD (end stage renal disease) on dialysis ([place]) 08/07/2017  . Nonhealing surgical wound 02/29/2016  . Clotted dialysis access ([place]) 01/14/2016  . Removal of staples 07/19/2012  . Complication of AV dialysis fistula 06/27/2012  . End stage renal disease ([place]) 06/27/2012    Past Surgical History:  Procedure Laterality Date  . AMPUTATION Left 12/11/2017   Procedure: Left Great Toe Amputation;  Surgeon: [name], MD;  Location: [place];  Service: Orthopedics;  Laterality:  Left;  . APPENDECTOMY    . ARTERIOVENOUS GRAFT PLACEMENT Left 10/16/2012   left femoral goretex graft         Dr Donnetta Hutching  . AV FISTULA PLACEMENT Left 06/27/2012   Procedure: EXPLORATORY LEFT THY-GRAFT PSEUDO-ANEURYSM;  Surgeon: Conrad Johnson Lane, MD;  Location: [place];  Service: Vascular;  Laterality: Left;  Revision of left Arteriovenus gortex graft in thigh.  . COLONOSCOPY    . I&D EXTREMITY Left 02/29/2016   Procedure: DEBRIDEMENT LEFT THIGH WOUND;  Surgeon: Waynetta Sandy, MD;  Location: [place];  Service: Vascular;  Laterality: Left;  . ILEOSTOMY  1970s  . INCISION AND DRAINAGE Left 10/16/2012   Procedure: INCISION AND Debridement left thigh graft;  Surgeon: Rosetta Posner, MD;  Location: [place];   Service: Vascular;  Laterality: Left;  . INSERTION OF DIALYSIS CATHETER    . INSERTION OF DIALYSIS CATHETER  01/14/2016   Procedure: INSERTION OF DIALYSIS CATHETER;  Surgeon: Waynetta Sandy, MD;  Location: [place];  Service: Vascular;;  . INSERTION OF DIALYSIS CATHETER Right 08/07/2017   Procedure: INSERTION OF DIALYSIS CATHETER;  Surgeon: Waynetta Sandy, MD;  Location: [place];  Service: Vascular;  Laterality: Right;  . INSERTION OF DIALYSIS CATHETER Right 12/21/2017   Procedure: RIGHT FEMORAL DIALYSIS CATHETER EXCHANGE;  Surgeon: Marty Heck, MD;  Location: [place];  Service: Vascular;  Laterality: Right;  . INSERTION OF ILIAC STENT Left 01/14/2016   Procedure: INSERTION OF ILIAC STENT;  Surgeon: Waynetta Sandy, MD;  Location: [place];  Service: Vascular;  Laterality: Left;  . INTRAOPERATIVE ARTERIOGRAM Left 01/14/2016   Procedure: INTRA OPERATIVE ARTERIOGRAM;  Surgeon: Waynetta Sandy, MD;  Location: [place];  Service: Vascular;  Laterality: Left;  . LITHOTRIPSY  x 3   "& a laser treatment" (02/29/2016)  . NEPHROSTOMY Bilateral 1968  . PATELLAR TENDON REPAIR Right 1990s   "big incision"  . PERIPHERAL VASCULAR CATHETERIZATION  01/14/2016   Procedure: A/V SHUNTOGRAM;  Surgeon: Waynetta Sandy, MD;  Location: [place];  Service: Vascular;;  . REVISION OF ARTERIOVENOUS GORETEX GRAFT Left 10/16/2012   Procedure: REVISION OF LEFT FEMORAL LOOP ARTERIOVENOUS GORETEX GRAFT;  Surgeon: Rosetta Posner, MD;  Location: [place];  Service: Vascular;  Laterality: Left;  . REVISION OF ARTERIOVENOUS GORETEX GRAFT Left 02/11/2015   Procedure: EXCISION OF SMALL SEGMENT OF EXPOSED LEFT THIGH NON FUNCTIONING  ARTERIOVENOUS GORETEX GRAFT;  Surgeon: Mal Misty, MD;  Location: [place];  Service: Vascular;  Laterality: Left;  . REVISION OF ARTERIOVENOUS GORETEX GRAFT Left 01/14/2016   Procedure: REVISION OF ARTERIOVENOUS GORETEX GRAFT;  Surgeon: Waynetta Sandy, MD;   Location: [place];  Service: Vascular;  Laterality: Left;  . REVISION OF ARTERIOVENOUS GORETEX GRAFT Left 02/29/2016   thigh/pt report  . REVISION OF ARTERIOVENOUS GORETEX GRAFT Left 02/29/2016   Procedure: POSSIBLE REVISION OF LEFT THIGH ARTERIOVENOUS GORETEX GRAFT;  Surgeon: Waynetta Sandy, MD;  Location: [place];  Service: Vascular;  Laterality: Left;  . TEE WITHOUT CARDIOVERSION N/A 12/14/2017   Procedure: TRANSESOPHAGEAL ECHOCARDIOGRAM (TEE);  Surgeon: Acie Fredrickson Wonda Cheng, MD;  Location: [facility] ENDOSCOPY;  Service: Cardiovascular;  Laterality: N/A;  . THROMBECTOMY W/ EMBOLECTOMY Left 01/14/2016   Procedure: THROMBECTOMY ARTERIOVENOUS GORE-TEX Left thigh GRAFT;  Surgeon: Waynetta Sandy, MD;  Location: [place];  Service: Vascular;  Laterality: Left;  . TONGUE SURGERY  ~ 1990   tongue-tie release   . ULTRASOUND GUIDANCE FOR VASCULAR ACCESS Right 08/07/2017   Procedure: ULTRASOUND GUIDANCE  FOR VASCULAR CANNULATION RIGHT FEMORAL VEIN AND LEFT AV FEMORAL GRAFT;  Surgeon: Waynetta Sandy, MD;  Location: [place];  Service: Vascular;  Laterality: Right;  . UPPER EXTREMITY VENOGRAPHY Bilateral 08/09/2017   Procedure: UPPER EXTREMITY VENOGRAPHY;  Surgeon: Serafina Mitchell, MD;  Location: [place] CV [facility];  Service: Cardiovascular;  Laterality: Bilateral;  . VENOGRAM N/A 09/20/2017   Procedure: RIGHT COMMON FEMORAL ARTERY EXPLORATION.  CANNULATION RIGHT COMMON FEMORAL VEIN. VENOGRAM CENTRAL ULTRA SOUND GUIDED RIGHT FEMORAL VEIN TIMES TWO.;  Surgeon: Waynetta Sandy, MD;  Location: [place];  Service: Vascular;  Laterality: N/A;  . WOUND DEBRIDEMENT Left 02/29/2016   thigh        Home Medications    Prior to Admission medications   Medication Sig Start Date End Date Taking? Authorizing Provider  aspirin 325 MG EC tablet Take 325 mg by mouth daily.    [provider]  calcium acetate (PHOSLO) 667 MG capsule Take 3-4 capsules (2,001-2,668 mg total) by mouth See admin  instructions. Take 2708 mg by mouth 3 times daily with meals, take 2001 mg by mouth with snacks 01/14/18   Hennie Duos, MD  clopidogrel (PLAVIX) 75 MG tablet Take 1 tablet (75 mg total) by mouth daily. 01/14/18   Hennie Duos, MD  famotidine (PEPCID) 40 MG tablet Take 1 tablet (40 mg total) by mouth daily at 2 PM. 01/14/18   Hennie Duos, MD  fluticasone [facility]) 50 MCG/ACT nasal spray Place 2 sprays into both nostrils as needed for allergies or rhinitis (congestion). 01/14/18   Hennie Duos, MD  levothyroxine (SYNTHROID, LEVOTHROID) 125 MCG tablet Take 1 tablet (125 mcg total) by mouth daily before breakfast. 01/14/18   Hennie Duos, MD  methocarbamol (ROBAXIN) 500 MG tablet Take 1 tablet (500 mg total) by mouth every 6 (six) hours as needed for muscle spasms. 01/14/18   Hennie Duos, MD  midodrine (PROAMATINE) 5 MG tablet Take 1 tablet (5 mg total) by mouth 3 (three) times daily with meals. 01/14/18   Hennie Duos, MD  Nutritional Supplements (NEPRO PO) Take 1 Can by mouth daily.    [provider]  oxyCODONE-acetaminophen (PERCOCET) 10-325 MG tablet Take 1 tablet by mouth 3 (three) times daily. 01/14/18   Hennie Duos, MD  Polyethylene Glycol 3350 POWD Take 17 g by mouth daily as needed. [place] 1 CAPFUL (17GM) IN 4-8OZ. OF WATER AND DRINK BY MOUTH DAILY AS NEEDED FOR MILD CONSTIPATION 01/14/18   Hennie Duos, MD  promethazine (PHENERGAN) 25 MG tablet Take 1 tablet (25 mg total) by mouth 2 (two) times daily as needed for nausea or vomiting. 01/14/18   Hennie Duos, MD  sodium polystyrene (KAYEXALATE) powder Take 15 g by mouth See admin instructions. Take 15 g (mixed in water) by mouth on Saturdays and Sundays 01/14/18   Hennie Duos, MD  sodium zirconium cyclosilicate (LOKELMA) 10 g PACK packet Take 10 g by mouth daily. TAKE 10GM PACK ON OFF DIALYSIS DAYS ON TUESDAY ON THURSDAY ON SUNDAY 01/14/18   Hennie Duos, MD  traZODone (DESYREL) 100 MG  tablet Take 1 tablet (100 mg total) by mouth at bedtime. 01/14/18   Hennie Duos, MD  zolpidem (AMBIEN) 10 MG tablet Take 1 tablet (10 mg total) by mouth at bedtime. 01/14/18   Hennie Duos, MD    Family History Family History  Problem Relation Age of Onset  . Diabetes Mother   . Hypertension Mother   .  Heart disease Mother        before age 18  . Diabetes Father   . Heart attack Father        X's 3  . Diabetes Sister   . Bipolar disorder Sister     Social History Social History   Tobacco Use  . Smoking status: Never Smoker  . Smokeless tobacco: Never Used  Substance Use Topics  . Alcohol use: Not Currently    Alcohol/week: 0.0 standard drinks    Comment: 02/29/2016 "nothing since  the mid 1990s  . Drug use: Not Currently    Types: Marijuana    Comment: 02/29/2016 "nothing since ~ 1995"     Allergies   Furadantin [nitrofurantoin]; Mandelamine [methenamine]; Noroxin [norfloxacin]; Carmine; Contrast media [iodinated diagnostic agents]; Hydrocodone; Metrizamide; Sulfa antibiotics; and Sulfur   Review of Systems Review of Systems  All other systems reviewed and are negative.    Physical Exam Updated Vital Signs BP 108/68 (BP Location: Right Arm)   Pulse 67   Temp 99 F (37.2 C) (Oral)   Resp 18   Wt 122.9 kg   SpO2 96%   BMI 37.79 kg/m   Physical Exam Vitals signs and nursing note reviewed.  Constitutional:      General: He is not in acute distress.    Appearance: He is well-developed. He is obese.  HENT:     Head: Atraumatic.  Eyes:     Conjunctiva/sclera: Conjunctivae normal.  Neck:     Musculoskeletal: Neck supple.  Cardiovascular:     Rate and Rhythm: Normal rate and regular rhythm.     Heart sounds: Murmur present.  Pulmonary:     Effort: Pulmonary effort is normal.     Breath sounds: Normal breath sounds.  Abdominal:     Palpations: Abdomen is soft.     Tenderness: There is no abdominal tenderness.  Skin:    Findings: No rash.      Comments: Left thigh: There is an ulceration approximately 5 mm in diameter located to mid anterior proximal thigh that is mildly indurated, with serous sanguinous mixed with purulent exudate when expressed.  No significant surrounding skin erythema.  It is tender to palpation.  Neurological:     Mental Status: He is alert.      ED Treatments / Results  Labs (all labs ordered are listed, but only abnormal results are displayed) Labs Reviewed  COMPREHENSIVE METABOLIC PANEL - Abnormal; Notable for the following components:      Result Value   Chloride 94 (*)    BUN 26 (*)    Creatinine, Ser 5.79 (*)    Total Protein 9.0 (*)    GFR calc non Af Amer 10 (*)    GFR calc Af Amer 12 (*)    All other components within normal limits  CBC WITH DIFFERENTIAL/PLATELET - Abnormal; Notable for the following components:   RBC 3.87 (*)    Hemoglobin 12.1 (*)    HCT 38.5 (*)    Platelets 123 (*)    Lymphs Abs 0.5 (*)    All other components within normal limits  NOVEL CORONAVIRUS, NAA (HOSPITAL ORDER, SEND-OUT TO REF LAB)  LACTIC ACID, PLASMA  LACTIC ACID, PLASMA  URINALYSIS, ROUTINE W REFLEX MICROSCOPIC    EKG None  Radiology No results found.  Procedures Procedures (including critical care time)  Medications Ordered in ED Medications  sodium chloride flush (NS) 0.9 % injection 3 mL (has no administration in time range)  LORazepam (ATIVAN) injection  0.5 mg (has no administration in time range)  ceFAZolin (ANCEF) IVPB 2g/100 mL premix (has no administration in time range)     Initial Impression / Assessment and Plan / ED Course  I have reviewed the triage vital signs and the nursing notes.  Pertinent labs & imaging results that were available during my care of the patient were reviewed by me and considered in my medical decision making (see chart for details).        BP 108/68 (BP Location: Right Arm)   Pulse 67   Temp 99 F (37.2 C) (Oral)   Resp 18   Wt 122.9 kg   SpO2  96%   BMI 37.79 kg/m    Final Clinical Impressions(s) / ED Diagnoses   Final diagnoses:  Abscess of left thigh    ED Discharge Orders    None     2:11 PM Patient here with an infection to his left upper thigh at the site of an nonfunction graft.  I am able to express serosanguineous mixed with purulent material from the site.  Suspect underlying infection.  Leg compartment is soft, doubt necrotizing fasciitis.  Patient states vascular surgeon, Dr. Donnetta Hutching has seen patient in the ED today and did mention patient will be admitted for surgical intervention tomorrow. Will obtain labs, and order covid-19 test. Care discussed with DR. Eulis Foster  2:18 PM Will consult vascular surgeon Dr. Donnetta Hutching for further recommendation including consideration for abx choice and advance imaging.   2:28 PM Dr. Donnetta Hutching will admit pt. He request 2g of ancef and pt to be NPO.    [name] was evaluated in Emergency Department on 06/12/2018 for the symptoms described in the history of present illness. He was evaluated in the context of the global COVID-19 pandemic, which necessitated consideration that the patient might be at risk for infection with the SARS-CoV-2 virus that causes COVID-19. Institutional protocols and algorithms that pertain to the evaluation of patients at risk for COVID-19 are in a state of rapid change based on information released by regulatory bodies including the CDC and federal and state organizations. These policies and algorithms were followed during the patient's care in the ED.    Domenic Moras, PA-C 06/12/18 1429    Daleen Bo, MD 06/14/18 2207

## 2018-06-12 NOTE — Transfer of Care (Signed)
Immediate Anesthesia Transfer of Care Note  Patient: [name]  Procedure(s) Performed: Removal of infected left thigh graft (Left Leg Upper)  Patient Location: PACU  Anesthesia Type:General  Level of Consciousness: awake  Airway & Oxygen Therapy: Patient Spontanous Breathing and Patient connected to face mask oxygen  Post-op Assessment: Report given to RN and Post -op Vital signs reviewed and stable  Post vital signs: Reviewed and stable  Last Vitals:  Vitals Value Taken Time  BP 106/73 06/12/2018  7:21 PM  Temp    Pulse 74 06/12/2018  7:22 PM  Resp 15 06/12/2018  7:22 PM  SpO2 100 % 06/12/2018  7:22 PM  Vitals shown include unvalidated device data.  Last Pain:  Vitals:   06/12/18 1520  TempSrc:   PainSc: 0-No pain         Complications: No apparent anesthesia complications

## 2018-06-12 NOTE — ED Triage Notes (Signed)
Pt in with L thigh wound, cultured today by his doctor and told to come to ER. 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

## 2018-06-12 NOTE — Progress Notes (Signed)
Pt from PACU to 4E08. Initial assessment complete, VSS, CHG done. Tele applied, CCMD notified. Pt oriented to room and call bell system. Will continue to monitor.

## 2018-06-13 ENCOUNTER — Encounter (HOSPITAL_COMMUNITY): Payer: Self-pay | Admitting: General Practice

## 2018-06-13 LAB — NOVEL CORONAVIRUS, NAA (HOSP ORDER, SEND-OUT TO REF LAB; TAT 18-24 HRS): SARS-CoV-2, NAA: NOT DETECTED

## 2018-06-13 LAB — CBC WITH DIFFERENTIAL/PLATELET
Abs Immature Granulocytes: 0.01 10*3/uL (ref 0.00–0.07)
Basophils Absolute: 0 10*3/uL (ref 0.0–0.1)
Basophils Relative: 0 %
Eosinophils Absolute: 0.1 10*3/uL (ref 0.0–0.5)
Eosinophils Relative: 3 %
HCT: 30.7 % — ABNORMAL LOW (ref 39.0–52.0)
Hemoglobin: 9.7 g/dL — ABNORMAL LOW (ref 13.0–17.0)
Immature Granulocytes: 0 %
Lymphocytes Relative: 12 %
Lymphs Abs: 0.5 10*3/uL — ABNORMAL LOW (ref 0.7–4.0)
MCH: 31.5 pg (ref 26.0–34.0)
MCHC: 31.6 g/dL (ref 30.0–36.0)
MCV: 99.7 fL (ref 80.0–100.0)
Monocytes Absolute: 0.5 10*3/uL (ref 0.1–1.0)
Monocytes Relative: 12 %
Neutro Abs: 2.9 10*3/uL (ref 1.7–7.7)
Neutrophils Relative %: 73 %
Platelets: 121 10*3/uL — ABNORMAL LOW (ref 150–400)
RBC: 3.08 MIL/uL — ABNORMAL LOW (ref 4.22–5.81)
RDW: 14.7 % (ref 11.5–15.5)
WBC: 4 10*3/uL (ref 4.0–10.5)
nRBC: 0 % (ref 0.0–0.2)

## 2018-06-13 MED ORDER — CHLORHEXIDINE GLUCONATE CLOTH 2 % EX PADS
6.0000 | MEDICATED_PAD | Freq: Every day | CUTANEOUS | Status: DC
  Administered 2018-06-14 – 2018-06-15 (×2): 6 via TOPICAL

## 2018-06-13 MED ORDER — VANCOMYCIN HCL 10 G IV SOLR
2000.0000 mg | INTRAVENOUS | Status: DC
  Filled 2018-06-13: qty 2000

## 2018-06-13 NOTE — Progress Notes (Signed)
Renal Navigator notified OP HD clinic/SW of patient's admission and negative COVID 19 test result in order to provide continuity of care and safety.  Alphonzo Cruise,  Renal Navigator [phone]

## 2018-06-13 NOTE — Consult Note (Signed)
Renal Service Consult Note [facility]  [name] 06/13/2018 [name] Requesting Physician:  Dr Early  Reason for Consult:  ESRD pt w/ removal old L thigh AVG HPI: The patient is a 53 y.o. year-old with hx of spina bifida/ paraplegic, HTN now w/ low BP's,  and ESRD on HD MWF who was admitted for removal of old exposed thigh graft material which was done today.  Asked to see for ESRD.    Patient on HD MWF, has been running here on thigh accesses since 2014 , before that no records.  He has been on dialysis since 1997.   - In Aug 2019 the L thigh AVG failed and a R thigh TDC was placed - In Sept 2019 Dr Donzetta Matters attempted a new R thigh AVG which was not successful due to poor quality of the deep venous system on that side. - In dec 2019 pt admitted w MSSA bacteremia and endocarditis w/ L toe osteo, sp L great toe amputuation, rx 6 wks IV abx at HD. The [facility] was not working well but improved and did not have to be replaced.  - In dec 2019 pt admitted again for malfunctioning HD cath R thigh. VVS did exchange for the [facility]. Pt is near end-stage access, if this [facility] fails he may need translumbar IVC w/ IR.    Pt has no c/o today, except some blood oozing from the L thigh wound postop .     ROS  denies CP  no joint pain   no HA  no blurry vision  no rash  no diarrhea  no nausea/ vomiting   Past Medical History  Past Medical History:  Diagnosis Date  . Anemia   . Anxiety   . Constipation   . End stage renal failure on dialysis [facility])    "[place]; MWF" (02/29/2016)  . GERD (gastroesophageal reflux disease)   . Grand mal seizure ([place]) X 4   last 1998 (02/29/2016)  . Heart murmur    no problems with per pt. - nephrologist and Dr. Coletta Memos  . History of blood transfusion 2000s   "I was having blood loss; never found out from where"  . History of kidney stones   . Hypertension    hx of - has not taken bp meds in over 2 years  . Hypothyroidism   . Insomnia    . Migraine    "due to my BP in my late 1990s; none since" (02/29/2016)  . Nonhealing surgical wound    of the left arteriovenous graft  . Pneumonia    x 2  . Shortness of breath    rarely  . Spina bifida [facility])    Past Surgical History  Past Surgical History:  Procedure Laterality Date  . AMPUTATION Left 12/11/2017   Procedure: Left Great Toe Amputation;  Surgeon: [name], MD;  Location: [place];  Service: Orthopedics;  Laterality: Left;  . APPENDECTOMY    . ARTERIOVENOUS GRAFT PLACEMENT Left 10/16/2012   left femoral goretex graft         Dr Donnetta Hutching  . AV FISTULA PLACEMENT Left 06/27/2012   Procedure: EXPLORATORY LEFT THY-GRAFT PSEUDO-ANEURYSM;  Surgeon: Conrad Shorewood Forest, MD;  Location: [place];  Service: Vascular;  Laterality: Left;  Revision of left Arteriovenus gortex graft in thigh.  . COLONOSCOPY    . I&D EXTREMITY Left 02/29/2016   Procedure: DEBRIDEMENT LEFT THIGH WOUND;  Surgeon: Waynetta Sandy, MD;  Location: [place];  Service: Vascular;  Laterality: Left;  . ILEOSTOMY  1970s  . INCISION AND DRAINAGE Left 10/16/2012   Procedure: INCISION AND Debridement left thigh graft;  Surgeon: Rosetta Posner, MD;  Location: [place];  Service: Vascular;  Laterality: Left;  . INSERTION OF DIALYSIS CATHETER    . INSERTION OF DIALYSIS CATHETER  01/14/2016   Procedure: INSERTION OF DIALYSIS CATHETER;  Surgeon: Waynetta Sandy, MD;  Location: [place];  Service: Vascular;;  . INSERTION OF DIALYSIS CATHETER Right 08/07/2017   Procedure: INSERTION OF DIALYSIS CATHETER;  Surgeon: Waynetta Sandy, MD;  Location: [place];  Service: Vascular;  Laterality: Right;  . INSERTION OF DIALYSIS CATHETER Right 12/21/2017   Procedure: RIGHT FEMORAL DIALYSIS CATHETER EXCHANGE;  Surgeon: Marty Heck, MD;  Location: [place];  Service: Vascular;  Laterality: Right;  . INSERTION OF ILIAC STENT Left 01/14/2016   Procedure: INSERTION OF ILIAC STENT;  Surgeon: Waynetta Sandy, MD;   Location: [place];  Service: Vascular;  Laterality: Left;  . INTRAOPERATIVE ARTERIOGRAM Left 01/14/2016   Procedure: INTRA OPERATIVE ARTERIOGRAM;  Surgeon: Waynetta Sandy, MD;  Location: [place];  Service: Vascular;  Laterality: Left;  . LITHOTRIPSY  x 3   "& a laser treatment" (02/29/2016)  . NEPHROSTOMY Bilateral 1968  . PATELLAR TENDON REPAIR Right 1990s   "big incision"  . PERIPHERAL VASCULAR CATHETERIZATION  01/14/2016   Procedure: A/V SHUNTOGRAM;  Surgeon: Waynetta Sandy, MD;  Location: [place];  Service: Vascular;;  . REVISION OF ARTERIOVENOUS GORETEX GRAFT Left 10/16/2012   Procedure: REVISION OF LEFT FEMORAL LOOP ARTERIOVENOUS GORETEX GRAFT;  Surgeon: Rosetta Posner, MD;  Location: [place];  Service: Vascular;  Laterality: Left;  . REVISION OF ARTERIOVENOUS GORETEX GRAFT Left 02/11/2015   Procedure: EXCISION OF SMALL SEGMENT OF EXPOSED LEFT THIGH NON FUNCTIONING  ARTERIOVENOUS GORETEX GRAFT;  Surgeon: Mal Misty, MD;  Location: [place];  Service: Vascular;  Laterality: Left;  . REVISION OF ARTERIOVENOUS GORETEX GRAFT Left 01/14/2016   Procedure: REVISION OF ARTERIOVENOUS GORETEX GRAFT;  Surgeon: Waynetta Sandy, MD;  Location: [place];  Service: Vascular;  Laterality: Left;  . REVISION OF ARTERIOVENOUS GORETEX GRAFT Left 02/29/2016   thigh/pt report  . REVISION OF ARTERIOVENOUS GORETEX GRAFT Left 02/29/2016   Procedure: POSSIBLE REVISION OF LEFT THIGH ARTERIOVENOUS GORETEX GRAFT;  Surgeon: Waynetta Sandy, MD;  Location: [place];  Service: Vascular;  Laterality: Left;  . TEE WITHOUT CARDIOVERSION N/A 12/14/2017   Procedure: TRANSESOPHAGEAL ECHOCARDIOGRAM (TEE);  Surgeon: Acie Fredrickson, Wonda Cheng, MD;  Location: [place];  Service: Cardiovascular;  Laterality: N/A;  . THROMBECTOMY AND REVISION OF ARTERIOVENTOUS (AV) GORETEX  GRAFT Left 06/12/2018   Procedure: Removal of infected left thigh graft;  Surgeon: Rosetta Posner, MD;  Location: [place];  Service: Vascular;   Laterality: Left;  . THROMBECTOMY W/ EMBOLECTOMY Left 01/14/2016   Procedure: THROMBECTOMY ARTERIOVENOUS GORE-TEX Left thigh GRAFT;  Surgeon: Waynetta Sandy, MD;  Location: [place];  Service: Vascular;  Laterality: Left;  . TONGUE SURGERY  ~ 1990   tongue-tie release   . ULTRASOUND GUIDANCE FOR VASCULAR ACCESS Right 08/07/2017   Procedure: ULTRASOUND GUIDANCE FOR VASCULAR CANNULATION RIGHT FEMORAL VEIN AND LEFT AV FEMORAL GRAFT;  Surgeon: Waynetta Sandy, MD;  Location: [place];  Service: Vascular;  Laterality: Right;  . UPPER EXTREMITY VENOGRAPHY Bilateral 08/09/2017   Procedure: UPPER EXTREMITY VENOGRAPHY;  Surgeon: Serafina Mitchell, MD;  Location: [place] [facility];  Service: Cardiovascular;  Laterality: Bilateral;  .  VENOGRAM N/A 09/20/2017   Procedure: RIGHT COMMON FEMORAL ARTERY EXPLORATION.  CANNULATION RIGHT COMMON FEMORAL VEIN. VENOGRAM CENTRAL ULTRA SOUND GUIDED RIGHT FEMORAL VEIN TIMES TWO.;  Surgeon: Waynetta Sandy, MD;  Location: [facility] OR;  Service: Vascular;  Laterality: N/A;  . WOUND DEBRIDEMENT Left 02/29/2016   thigh   Family History  Family History  Problem Relation Age of Onset  . Diabetes Mother   . Hypertension Mother   . Heart disease Mother        before age 32  . Diabetes Father   . Heart attack Father        X's 3  . Diabetes Sister   . Bipolar disorder Sister    Social History  reports that he has never smoked. He has never used smokeless tobacco. He reports previous alcohol use. He reports previous drug use. Drug: Marijuana. Allergies  Allergies  Allergen Reactions  . Furadantin [Nitrofurantoin] Other (See Comments)    UNSPECIFIED REACTION   . Mandelamine [Methenamine] Other (See Comments)    UNSPECIFIED REACTION   . Noroxin [Norfloxacin] Other (See Comments)    UNSPECIFIED REACTION   . [place]ine Nausea Only  . Contrast Media [Iodinated Diagnostic Agents] Nausea And Vomiting    Oral dye causes vomiting, IV dye is okay  . Hydrocodone  Other (See Comments)    Caused involuntary movement and twitching. CAN NOT TAKE DUE TO MUSCLE SPASMS AND MUSCLE TREMORS  . Metrizamide Nausea And Vomiting    Oral dye causes vomiting, IV dye is okay  . Sulfa Antibiotics Cough    Childhood reaction - pt could not confirm that it was a cough  . Sulfur Cough    Childhood reaction - pt could not confirm that it was a cough   Home medications Prior to Admission medications   Medication Sig Start Date End Date Taking? Authorizing Provider  aspirin 325 MG EC tablet Take 325 mg by mouth daily.   Yes [provider]  calcium acetate (PHOSLO) 667 MG capsule Take 3-4 capsules (2,001-2,668 mg total) by mouth See admin instructions. Take 2708 mg by mouth 3 times daily with meals, take 2001 mg by mouth with snacks 01/14/18  Yes Hennie Duos, MD  clopidogrel (PLAVIX) 75 MG tablet Take 1 tablet (75 mg total) by mouth daily. 01/14/18  Yes Hennie Duos, MD  famotidine (PEPCID) 40 MG tablet Take 1 tablet (40 mg total) by mouth daily at 2 PM. 01/14/18  Yes Hennie Duos, MD  fluticasone [facility]) 50 MCG/ACT nasal spray Place 2 sprays into both nostrils as needed for allergies or rhinitis (congestion). 01/14/18  Yes Hennie Duos, MD  levothyroxine (SYNTHROID, LEVOTHROID) 125 MCG tablet Take 1 tablet (125 mcg total) by mouth daily before breakfast. 01/14/18  Yes Hennie Duos, MD  oxyCODONE-acetaminophen (PERCOCET) 10-325 MG tablet Take 1 tablet by mouth 3 (three) times daily. 01/14/18  Yes Hennie Duos, MD  promethazine (PHENERGAN) 25 MG tablet Take 1 tablet (25 mg total) by mouth 2 (two) times daily as needed for nausea or vomiting. 01/14/18  Yes Hennie Duos, MD  sodium polystyrene (KAYEXALATE) powder Take 15 g by mouth See admin instructions. Take 15 g (mixed in water) by mouth on Saturdays and Sundays 01/14/18  Yes Hennie Duos, MD  traZODone (DESYREL) 100 MG tablet Take 1 tablet (100 mg total) by mouth at bedtime. 01/14/18   Yes Hennie Duos, MD  zolpidem (AMBIEN) 10 MG tablet Take 1 tablet (10 mg total)  by mouth at bedtime. 01/14/18  Yes Hennie Duos, MD  methocarbamol (ROBAXIN) 500 MG tablet Take 1 tablet (500 mg total) by mouth every 6 (six) hours as needed for muscle spasms. Patient not taking: Reported on 06/12/2018 01/14/18   Hennie Duos, MD  midodrine (PROAMATINE) 5 MG tablet Take 1 tablet (5 mg total) by mouth 3 (three) times daily with meals. Patient not taking: Reported on 06/12/2018 01/14/18   Hennie Duos, MD  Nutritional Supplements (NEPRO PO) Take 1 Can by mouth daily.    [provider]  Polyethylene Glycol 3350 POWD Take 17 g by mouth daily as needed. [place] 1 CAPFUL (17GM) IN 4-8OZ. OF WATER AND DRINK BY MOUTH DAILY AS NEEDED FOR MILD CONSTIPATION Patient not taking: Reported on 06/12/2018 01/14/18   Hennie Duos, MD  sodium zirconium cyclosilicate (LOKELMA) 10 g PACK packet Take 10 g by mouth daily. TAKE 10GM PACK ON OFF DIALYSIS DAYS ON TUESDAY ON THURSDAY ON SUNDAY Patient not taking: Reported on 06/12/2018 01/14/18   Hennie Duos, MD   Liver Function Tests Recent Labs  Lab 06/12/18 1224  AST 15  ALT 13  ALKPHOS 56  BILITOT 0.8  PROT 9.0*  ALBUMIN 3.9   No results for input(s): LIPASE, AMYLASE in the last 168 hours. CBC Recent Labs  Lab 06/12/18 1224 06/13/18 1040  WBC 4.3 4.0  NEUTROABS 3.2 2.9  HGB 12.1* 9.7*  HCT 38.5* 30.7*  MCV 99.5 99.7  PLT 123* 408*   Basic Metabolic Panel Recent Labs  Lab 06/12/18 1224  NA 135  K 4.2  CL 94*  CO2 27  GLUCOSE 79  BUN 26*  CREATININE 5.79*  CALCIUM 9.3   Iron/TIBC/Ferritin/ %Sat No results found for: IRON, TIBC, FERRITIN, IRONPCTSAT  Vitals:   06/12/18 2059 06/13/18 0023 06/13/18 0405 06/13/18 0800  BP: 97/77 (!) 96/58 (!) 94/53 (!) 118/59  Pulse: 79  69 70  Resp: 18 12 11    Temp: 99.3 F (37.4 C) 98.2 F (36.8 C) 98.1 F (36.7 C) 98.2 F (36.8 C)  TempSrc: Oral Oral Oral Oral  SpO2:  100%  96%   Weight:   122.8 kg   Height:        Exam Gen alert, in bed, no distress No rash, cyanosis or gangrene Sclera anicteric, throat clear  No jvd or bruits Chest clear bilat to bases RRR no MRG Abd soft ntnd no mass or ascites +bs obese GU normal male MS bilat LE's w/ musc wasting Ext no pitting LE edema, no wounds or ulcers Neuro is alert, Ox 3 , nf, paraplegic    Home meds:  - aspirin 325/ clopidogrel 75  - midodrine 5 tid  - zirconium cyclosilicate 10 gm qd  - calc acetate ac tid/ famotidine 40 / levothyroxine 125/ kayexalte 15 gm prn  - trazodone 100 hs/ zolpidem 10 hs/ methocarbamol 500 qid prn    Outpt HD: MWF AF  5h  450/800  123.5kg  2K/3.5Ca bath  Hep 3700   R thigh TDC - V 50mg / wk - hect 6 - BP's good, getting to edw     Assessment/ Plan: 1. SP removal exposed segment of old (nonfunctional) L thigh AVG 2. ESRD: on HD MWF. HD tomorrow. Gets [facility]PA into [facility] every Friday after HD and it is removed on Monday preHD.  3. Hypotension: chronic on midodrine 4. MBD: cont meds 5. Anemia ckd: Hb 12 > 9.7 here after bleeding. Observe.  6. Spina bifida/ paraplegia  [name]  MD 06/13/2018, 12:23 PM

## 2018-06-13 NOTE — Progress Notes (Addendum)
Pt with removal of left thigh graft draining moderate amount bloody drainage. Dressing reinforced upon arrival from PACU at 2100. On assessment pt bled thru multiple layers of guaze ABD and paper tape. Incision actively bleeding. Dressing changed 4x4 ABD paper tape and wrapped in kerlix. Will continue to monitor.

## 2018-06-13 NOTE — Op Note (Signed)
    OPERATIVE REPORT  DATE OF SURGERY: 06/12/2018  PATIENT: [name], 53 y.o. male MRN: [id number]  DOB: [birth year]  PRE-OPERATIVE DIAGNOSIS: End-stage renal disease with exposed nonfunctional Gore-Tex graft and a old left femoral loop graft  POST-OPERATIVE DIAGNOSIS:  Same  PROCEDURE: Excision of the exposed portion of Gore-Tex graft and closure  SURGEON:  Curt Jews, M.D.  PHYSICIAN ASSISTANT: Matt Eveland, PA-C  ANESTHESIA: General  EBL: per anesthesia record  No intake/output data recorded.  BLOOD ADMINISTERED: none  DRAINS: none  SPECIMEN: none  COUNTS CORRECT:  YES  PATIENT DISPOSITION:  PACU - hemodynamically stable  PROCEDURE DETAILS: Patient was taken up and placed to position where the area of the left thigh was prepped draped in sterile fashion.  Incision was made over the exposed Gore-Tex graft.  There was some fluid around this and this was sent for aerobic and anaerobic culture.  The graft was dissected proximal and distal to the area of exposed graft and I was transected up under the skin tunnel.  The wound was irrigated with saline.  The skin was excised where the graft had been exposed.  The skin all appeared to be viable and there was no evidence of abscess.  Wound was irrigated with saline and hemostasis obtained electrocautery.  The wound was closed with 3-0 nylon mattress sutures.  Sterile dressing was applied and the patient was transferred to the recovery room in stable condition   Rosetta Posner, M.D., [facility] 06/13/2018 10:18 AM

## 2018-06-13 NOTE — Progress Notes (Addendum)
  Progress Note    06/13/2018 7:21 AM 1 Day Post-Op  Subjective:  Oozing from L thigh surgery site overnight   Vitals:   06/13/18 0023 06/13/18 0405  BP: (!) 96/58 (!) 94/53  Pulse:  69  Resp: 12 11  Temp: 98.2 F (36.8 C) 98.1 F (36.7 C)  SpO2:  96%   Physical Exam: Lungs:  Non labored Incisions:  L thigh surgical wound with active ooz, dark blood Extremities:  Feet warm Neurologic: A&O  CBC    Component Value Date/Time   WBC 4.3 06/12/2018 1224   RBC 3.87 (L) 06/12/2018 1224   HGB 12.1 (L) 06/12/2018 1224   HCT 38.5 (L) 06/12/2018 1224   PLT 123 (L) 06/12/2018 1224   MCV 99.5 06/12/2018 1224   MCH 31.3 06/12/2018 1224   MCHC 31.4 06/12/2018 1224   RDW 14.6 06/12/2018 1224   LYMPHSABS 0.5 (L) 06/12/2018 1224   MONOABS 0.4 06/12/2018 1224   EOSABS 0.1 06/12/2018 1224   BASOSABS 0.0 06/12/2018 1224    BMET    Component Value Date/Time   NA 135 06/12/2018 1224   NA 135 (A) 01/17/2018   K 4.2 06/12/2018 1224   CL 94 (L) 06/12/2018 1224   CO2 27 06/12/2018 1224   GLUCOSE 79 06/12/2018 1224   BUN 26 (H) 06/12/2018 1224   BUN 67 (A) 01/17/2018   CREATININE 5.79 (H) 06/12/2018 1224   CALCIUM 9.3 06/12/2018 1224   GFRNONAA 10 (L) 06/12/2018 1224   GFRAA 12 (L) 06/12/2018 1224    INR    Component Value Date/Time   INR 1.14 12/13/2017 0351     Intake/Output Summary (Last 24 hours) at 06/13/2018 0721 Last data filed at 06/12/2018 1851 Gross per 24 hour  Intake 900 ml  Output 25 ml  Net 875 ml     Assessment/Plan:  53 y.o. male is s/p partial thigh graft removal 1 Day Post-Op   L thigh still oozing dark blood Pressure dressing applied Recheck this afternoon, likely discharge home   Dagoberto Ligas, PA-C [facility] [phone] 06/13/2018 7:21 AM  I have examined the patient, reviewed and agree with above.  Patient reports that he had no sleep last night.  Was nauseous when he presented to the emergency room yesterday.   Reports continued nausea despite Zofran.  Has a severe venous hypertension from his iliac vessel occlusions.  Did have some oozing from his incision.  Patient feels more comfortable with 1 more day of observation and discharge tomorrow.  Will contact nephrology for potential dialysis in a.m. prior to discharge  Curt Jews, MD 06/13/2018 8:00 AM

## 2018-06-13 NOTE — Progress Notes (Signed)
Left leg wound saturated dressing.  Redressed with compression wrap.  PA aware.  Will continue to monitor

## 2018-06-14 ENCOUNTER — Encounter (HOSPITAL_COMMUNITY): Payer: Self-pay | Admitting: Nephrology

## 2018-06-14 LAB — RENAL FUNCTION PANEL
Albumin: 3.2 g/dL — ABNORMAL LOW (ref 3.5–5.0)
Anion gap: 12 (ref 5–15)
BUN: 47 mg/dL — ABNORMAL HIGH (ref 6–20)
CO2: 25 mmol/L (ref 22–32)
Calcium: 9.1 mg/dL (ref 8.9–10.3)
Chloride: 98 mmol/L (ref 98–111)
Creatinine, Ser: 10.02 mg/dL — ABNORMAL HIGH (ref 0.61–1.24)
GFR calc Af Amer: 6 mL/min — ABNORMAL LOW (ref >60)
GFR calc non Af Amer: 5 mL/min — ABNORMAL LOW (ref >60)
Glucose, Bld: 81 mg/dL (ref 70–99)
Phosphorus: 6.9 mg/dL — ABNORMAL HIGH (ref 2.5–4.6)
Potassium: 6.9 mmol/L (ref 3.5–5.1)
Sodium: 135 mmol/L (ref 135–145)

## 2018-06-14 LAB — CBC
HCT: 31.7 % — ABNORMAL LOW (ref 39.0–52.0)
Hemoglobin: 9.7 g/dL — ABNORMAL LOW (ref 13.0–17.0)
MCH: 30.9 pg (ref 26.0–34.0)
MCHC: 30.6 g/dL (ref 30.0–36.0)
MCV: 101 fL — ABNORMAL HIGH (ref 80.0–100.0)
Platelets: 117 10*3/uL — ABNORMAL LOW (ref 150–400)
RBC: 3.14 MIL/uL — ABNORMAL LOW (ref 4.22–5.81)
RDW: 14.9 % (ref 11.5–15.5)
WBC: 4.3 10*3/uL (ref 4.0–10.5)
nRBC: 0 % (ref 0.0–0.2)

## 2018-06-14 MED ORDER — HEPARIN SODIUM (PORCINE) 1000 UNIT/ML IJ SOLN
INTRAMUSCULAR | Status: AC
  Administered 2018-06-14: 08:00:00
  Filled 2018-06-14: qty 4

## 2018-06-14 MED ORDER — ONDANSETRON HCL 4 MG/2ML IJ SOLN
INTRAMUSCULAR | Status: AC
  Administered 2018-06-14: 4 mg via INTRAVENOUS
  Filled 2018-06-14: qty 2

## 2018-06-14 MED ORDER — ALTEPLASE 2 MG IJ SOLR
INTRAMUSCULAR | Status: AC
  Administered 2018-06-14: 2 mg
  Filled 2018-06-14: qty 4

## 2018-06-14 MED ORDER — OXYCODONE-ACETAMINOPHEN 10-325 MG PO TABS: 1.0000 | ORAL_TABLET | Freq: Four times a day (QID) | ORAL | 0 refills | Status: DC | PRN

## 2018-06-14 NOTE — Discharge Instructions (Signed)
Incision Care, Adult °An incision is a cut that a doctor makes in your skin for surgery (for a procedure). Most times, these cuts are closed after surgery. Your cut from surgery may be closed with stitches (sutures), staples, skin glue, or skin tape (adhesive strips). You may need to return to your doctor to have stitches or staples taken out. This may happen many days or many weeks after your surgery. The cut needs to be well cared for so it does not get infected. °How to care for your cut °Cut care ° °· Follow instructions from your doctor about how to take care of your cut. Make sure you: °? Wash your hands with soap and water before you change your bandage (dressing). If you cannot use soap and water, use hand sanitizer. °? Change your bandage as told by your doctor. °? Leave stitches, skin glue, or skin tape in place. They may need to stay in place for 2 weeks or longer. If tape strips get loose and curl up, you may trim the loose edges. Do not remove tape strips completely unless your doctor says it is okay. °· Check your cut area every day for signs of infection. Check for: °? More redness, swelling, or pain. °? More fluid or blood. °? Warmth. °? Pus or a bad smell. °· Ask your doctor how to clean the cut. This may include: °? Using mild soap and water. °? Using a clean towel to pat the cut dry after you clean it. °? Putting a cream or ointment on the cut. Do this only as told by your doctor. °? Covering the cut with a clean bandage. °· Ask your doctor when you can leave the cut uncovered. °· Do not take baths, swim, or use a hot tub until your doctor says it is okay. Ask your doctor if you can take showers. You may only be allowed to take sponge baths for bathing. °Medicines °· If you were prescribed an antibiotic medicine, cream, or ointment, take the antibiotic or put it on the cut as told by your doctor. Do not stop taking or putting on the antibiotic even if your condition gets better. °· Take  over-the-counter and prescription medicines only as told by your doctor. °General instructions °· Limit movement around your cut. This helps healing. °? Avoid straining, lifting, or exercise for the first month, or for as long as told by your doctor. °? Follow instructions from your doctor about going back to your normal activities. °? Ask your doctor what activities are safe. °· Protect your cut from the sun when you are outside for the first 6 months, or for as long as told by your doctor. Put on sunscreen around the scar or cover up the scar. °· Keep all follow-up visits as told by your doctor. This is important. °Contact a doctor if: °· Your have more redness, swelling, or pain around the cut. °· You have more fluid or blood coming from the cut. °· Your cut feels warm to the touch. °· You have pus or a bad smell coming from the cut. °· You have a fever or shaking chills. °· You feel sick to your stomach (nauseous) or you throw up (vomit). °· You are dizzy. °· Your stitches or staples come undone. °Get help right away if: °· You have a red streak coming from your cut. °· Your cut bleeds through the bandage and the bleeding does not stop with gentle pressure. °· The edges of your cut   open up and separate. °· You have very bad (severe) pain. °· You have a rash. °· You are confused. °· You pass out (faint). °· You have trouble breathing and you have a fast heartbeat. °This information is not intended to replace advice given to you by your health care provider. Make sure you discuss any questions you have with your health care provider. °Document Released: 03/13/2011 Document Revised: 08/27/2015 Document Reviewed: 08/27/2015 °Elsevier Interactive Patient Education © 2019 [employer] Inc[employer] ° °

## 2018-06-14 NOTE — Progress Notes (Signed)
  Progress Note    06/14/2018 3:03 PM 2 Days Post-Op  Subjective:  Says dressing was changed several times yesterday  Afebrile   Vitals:   06/14/18 1126 06/14/18 1211  BP: (!) 126/56 126/71  Pulse: 61   Resp: 16 13  Temp: 98.3 F (36.8 C)   SpO2: 100%     Physical Exam: General:  No distress Lungs:  Non labored Incisions:  Clean and dry with persistent bloody ooze   CBC    Component Value Date/Time   WBC 4.3 06/14/2018 0742   RBC 3.14 (L) 06/14/2018 0742   HGB 9.7 (L) 06/14/2018 0742   HCT 31.7 (L) 06/14/2018 0742   PLT 117 (L) 06/14/2018 0742   MCV 101.0 (H) 06/14/2018 0742   MCH 30.9 06/14/2018 0742   MCHC 30.6 06/14/2018 0742   RDW 14.9 06/14/2018 0742   LYMPHSABS 0.5 (L) 06/13/2018 1040   MONOABS 0.5 06/13/2018 1040   EOSABS 0.1 06/13/2018 1040   BASOSABS 0.0 06/13/2018 1040    BMET    Component Value Date/Time   NA 135 06/14/2018 0742   NA 135 (A) 01/17/2018   K 6.9 (HH) 06/14/2018 0742   CL 98 06/14/2018 0742   CO2 25 06/14/2018 0742   GLUCOSE 81 06/14/2018 0742   BUN 47 (H) 06/14/2018 0742   BUN 67 (A) 01/17/2018   CREATININE 10.02 (H) 06/14/2018 0742   CALCIUM 9.1 06/14/2018 0742   GFRNONAA 5 (L) 06/14/2018 0742   GFRAA 6 (L) 06/14/2018 0742    INR    Component Value Date/Time   INR 1.14 12/13/2017 0351     Intake/Output Summary (Last 24 hours) at 06/14/2018 1503 Last data filed at 06/14/2018 1126 Gross per 24 hour  Intake 661.41 ml  Output -32 ml  Net 693.41 ml     Assessment:  53 y.o. male is s/p:  Excision of the exposed portion of Gore-Tex graft and closure  2 Days Post-Op  Plan: -dressing removed this afternoon and he still has persistent ooze after 30 minutes of manual pressure held.  Dressing placed back on wound.  Pt states that the ooze has improved but still persistent.  hgb stable from yesterday.  Dressing placed back on wound.  -pt SCAT was scheduled at 1430 and had to cancel by 1330 or he would be penalized.  This  was cancelled and rescheduled for pick up tomorrow. -DVT prophylaxis:  Will hold sq heparin due to persistent oozing from incision.  Has not received the past 2 scheduled doses. Will use SCD's.   Leontine Locket, PA-C [facility] [phone] 06/14/2018 3:03 PM

## 2018-06-14 NOTE — Progress Notes (Addendum)
[facility] Progress Note  Subjective: no new c/o, on HD now  Vitals:   06/14/18 0830 06/14/18 0837 06/14/18 0900 06/14/18 0930  BP: (!) 92/37  (!) 80/37 (!) 86/38  Pulse: 60 60 (!) 59 (!) 59  Resp:      Temp:      TempSrc:      SpO2:      Weight:      Height:        Inpatient medications: . alteplase      . aspirin  325 mg Oral Daily  . Chlorhexidine Gluconate Cloth  6 each Topical Q0600  . famotidine  20 mg Oral Q1400  . heparin      . heparin  5,000 Units Subcutaneous Q8H  . levothyroxine  125 mcg Oral QAC breakfast  . midodrine  5 mg Oral TID WC  . ondansetron      . oxyCODONE-acetaminophen  1 tablet Oral Q8H   And  . oxyCODONE  5 mg Oral Q8H  . pantoprazole  40 mg Oral Daily  . sodium chloride flush  3 mL Intravenous Once  . [START ON 06/15/2018] sodium polystyrene  15 g Oral Once per day on Sun Sat  . traZODone  100 mg Oral QHS  . zolpidem  10 mg Oral QHS   . sodium chloride 10 mL/hr at 06/14/18 0430   fluticasone, hydrALAZINE, labetalol, metoprolol tartrate, morphine injection, ondansetron, promethazine    Exam: Gen alert, on HD no distress, calm No jvd or bruits Chest clear bilat to bases RRR no MRG Abd soft ntnd no mass or ascites +bs obese MS bilat LE's w/ musc wasting, L thigh wrapped , R thigh tDc Ext no pitting LE edema Neuro is alert, Ox 3 , nf, paraplegic    Home meds:  - aspirin 325/ clopidogrel 75  - midodrine 5 tid  - zirconium cyclosilicate 10 gm qd  - calc acetate ac tid/ famotidine 40 / levothyroxine 125/ kayexalte 15 gm prn  - trazodone 100 hs/ zolpidem 10 hs/ methocarbamol 500 qid prn    Outpt HD: MWF AF  5h  450/800  123.5kg  2K/3.5Ca bath  Hep 3700   R thigh TDC - V 50mg / wk - hect 6 - BP's good, getting to edw     Assessment/ Plan: 1. SP removal exposed segment of nonfunctional L thigh AVG: 6/11 Dr Early 2. ESRD: on HD MWF. HD today. Put TPA into [facility] every Friday after HD and remove it on Monday  preHD 3. Hyperkalemia: K 6.9 th is am, will use 1K bath for 90 min then 2K  4. Hypotension: chronic on midodrine 5. MBD: cont meds 6. Anemia ckd: Hb 12 > 9.7 here after bleeding. Observe.  7. Spina bifida/ paraplegia    [facility] 06/14/2018, 11:02 AM  Iron/TIBC/Ferritin/ %Sat No results found for: IRON, TIBC, FERRITIN, IRONPCTSAT Recent Labs  Lab 06/14/18 0742  NA 135  K 6.9*  CL 98  CO2 25  GLUCOSE 81  BUN 47*  CREATININE 10.02*  CALCIUM 9.1  PHOS 6.9*  ALBUMIN 3.2*   Recent Labs  Lab 06/12/18 1224  AST 15  ALT 13  ALKPHOS 56  BILITOT 0.8  PROT 9.0*   Recent Labs  Lab 06/14/18 0742  WBC 4.3  HGB 9.7*  HCT 31.7*  PLT 117*

## 2018-06-15 LAB — AEROBIC/ANAEROBIC CULTURE W GRAM STAIN (SURGICAL/DEEP WOUND)

## 2018-06-15 NOTE — Progress Notes (Addendum)
[facility]  Subjective  - Doing well over all   Objective (!) 91/51 67 98.2 F (36.8 C) (Oral) 16 98%  Intake/Output Summary (Last 24 hours) at 06/15/2018 1025 Last data filed at 06/15/2018 0953 Gross per 24 hour  Intake 830 ml  Output -32 ml  Net 862 ml    Left thigh dressing changed, no active bleeding.  Dry dressing applied. Lungs non labored breathing Gen NAD  Assessment/Planning: POD # 3 53 y.o. male is s/p:  Excision of the exposed portion of Gore-Tex graft and closure   pt SCAT was scheduled at 1430  Dry dressing as needed to left thigh.  [place] RN ordered Resume home medications F/U with Dr. Donnetta Hutching in 2-3 weeks   Roxy Horseman 06/15/2018 10:25 AM --  Laboratory Lab Results: Recent Labs    06/13/18 1040 06/14/18 0742  WBC 4.0 4.3  HGB 9.7* 9.7*  HCT 30.7* 31.7*  PLT 121* 117*   BMET Recent Labs    06/12/18 1224 06/14/18 0742  NA 135 135  K 4.2 6.9*  CL 94* 98  CO2 27 25  GLUCOSE 79 81  BUN 26* 47*  CREATININE 5.79* 10.02*  CALCIUM 9.3 9.1    COAG Lab Results  Component Value Date   INR 1.14 12/13/2017   INR 1.15 12/07/2017   INR 1.05 01/14/2016   No results found for: PTT  I agree with the above.  No significant bleeding from the incision.  Anticipate discharge home today  Annamarie Major

## 2018-06-15 NOTE — Progress Notes (Signed)
Pt was discharged from [address] unit at 02:10 pm. All discharged instruction, wound care, prescriptions and AVS given. Pt had fully understading, all questions answered. Dressing was dry clean and intact. Vital signs stable and no distress seen.All of his belongings given back.  Pt was left unit via wheelchair and a staff NT accompany.   Domingo Mend, BSN,RN, PCCn,CMC,CSC

## 2018-06-15 NOTE — Progress Notes (Signed)
[facility] Progress Note  Subjective: no new c/o, seen in room  Vitals:   06/14/18 1211 06/14/18 1740 06/14/18 2123 06/15/18 0537  BP: 126/71 98/60 116/66 (!) 91/51  Pulse:  71 77 67  Resp: 13 18 16    Temp:  99 F (37.2 C) 98.9 F (37.2 C) 98.2 F (36.8 C)  TempSrc:  Oral Oral Oral  SpO2:  100% 100% 98%  Weight:    124.2 kg  Height:        Inpatient medications: . aspirin  325 mg Oral Daily  . Chlorhexidine Gluconate Cloth  6 each Topical Q0600  . famotidine  20 mg Oral Q1400  . levothyroxine  125 mcg Oral QAC breakfast  . midodrine  5 mg Oral TID WC  . oxyCODONE-acetaminophen  1 tablet Oral Q8H   And  . oxyCODONE  5 mg Oral Q8H  . pantoprazole  40 mg Oral Daily  . sodium chloride flush  3 mL Intravenous Once  . sodium polystyrene  15 g Oral Once per day on Sun Sat  . traZODone  100 mg Oral QHS  . zolpidem  10 mg Oral QHS   . sodium chloride 10 mL/hr at 06/14/18 0430   fluticasone, hydrALAZINE, labetalol, metoprolol tartrate, morphine injection, ondansetron, promethazine    Exam: Gen alert, on HD no distress, calm No jvd or bruits Chest clear bilat to bases RRR no MRG Abd soft ntnd no mass or ascites +bs obese MS bilat LE's w/ musc wasting, L thigh wrapped , R thigh tDc Ext no pitting LE edema Neuro is alert, Ox 3 , nf, paraplegic    Home meds:  - aspirin 325/ clopidogrel 75  - midodrine 5 tid  - zirconium cyclosilicate 10 gm qd  - calc acetate ac tid/ famotidine 40 / levothyroxine 125/ kayexalte 15 gm prn  - trazodone 100 hs/ zolpidem 10 hs/ methocarbamol 500 qid prn    Outpt HD: MWF AF  5h  450/800  123.5kg  2K/3.5Ca bath  Hep 3700   R thigh TDC - V 50mg / wk - hect 6 - BP's good, getting to edw   Assessment/ Plan: 1. SP removal exposed segment of nonfunctional L thigh AVG: 6/11 Dr Early 2. ESRD: on HD MWF. HD yest. Going home today.  3. Hyperkalemia: K 6.9 yest pre HD, no labs this am.  4. Hypotension: chronic on  midodrine 5. MBD: cont meds 6. Anemia ckd: Hb 12 > 9.7 here after bleeding, stable.  7. Spina bifida/ paraplegia 8. dispo - going home today    [facility] 06/15/2018, 12:15 PM  Iron/TIBC/Ferritin/ %Sat No results found for: IRON, TIBC, FERRITIN, IRONPCTSAT Recent Labs  Lab 06/14/18 0742  NA 135  K 6.9*  CL 98  CO2 25  GLUCOSE 81  BUN 47*  CREATININE 10.02*  CALCIUM 9.1  PHOS 6.9*  ALBUMIN 3.2*   Recent Labs  Lab 06/12/18 1224  AST 15  ALT 13  ALKPHOS 56  BILITOT 0.8  PROT 9.0*   Recent Labs  Lab 06/14/18 0742  WBC 4.3  HGB 9.7*  HCT 31.7*  PLT 117*

## 2018-06-18 ENCOUNTER — Encounter (HOSPITAL_COMMUNITY): Payer: Self-pay | Admitting: Vascular Surgery

## 2018-06-18 NOTE — Discharge Summary (Signed)
[facility] Discharge Summary   Patient ID:  [name] MRN: [id number] DOB/AGE: [birth year] 53 y.o.  Admit date: 06/12/2018 Discharge date: 06/15/2018 Date of Surgery: 06/12/2018 Surgeon: Juliann Mule): Early, Arvilla Meres, MD  Admission Diagnosis: Abscess of left thigh [L02.416]  Discharge Diagnoses:  Abscess of left thigh [L02.416]  Secondary Diagnoses: Past Medical History:  Diagnosis Date  . Anemia   . Anxiety   . Constipation   . End stage renal failure on dialysis [facility])    [place]; MWF, on HD since 1997, exhausted upper ext access, and possibly LE access; cath dependent in R groin as of 06/2018  . GERD (gastroesophageal reflux disease)   . Grand mal seizure ([place]) X 4   last 1998 (02/29/2016)  . Heart murmur    no problems with per pt. - nephrologist and Dr. Coletta Memos  . History of blood transfusion 2000s   "I was having blood loss; never found out from where"  . History of kidney stones   . Hypertension    hx of - has not taken bp meds in over 2 years  . Hypothyroidism   . Insomnia   . Migraine    "due to my BP in my late 1990s; none since" (02/29/2016)  . Nonhealing surgical wound    of the left arteriovenous graft  . Pneumonia    x 2  . Shortness of breath    rarely  . Spina bifida ([place])     Procedure(s): Removal of infected left thigh graft  Discharged Condition: stable  HPI: [name] is a 53 y.o. male presenting to the emergency room with exposed Gore-Tex graft in his left thigh.  He has a history of end-stage renal disease dating back at least 20 years.  He has had multiple accesses in all 4 extremities.  He has had no further options for arteriovenous access and is currently being dialyzed via a right femoral catheter that courses through a collateral into his cava.     Hospital Course:  [name] is a 53 y.o. male is S/P  Procedure(s): Removal of infected left thigh graft Post op he was has prolonged bleeding at the  incision site.  He received HD while admitted. By day 3 the wound was clean and dry without active bleeding.  He was discharged home in stable condition.    Consults:  Treatment Team:  Roney Jaffe, MD  Significant Diagnostic Studies: CBC Lab Results  Component Value Date   WBC 4.3 06/14/2018   HGB 9.7 (L) 06/14/2018   HCT 31.7 (L) 06/14/2018   MCV 101.0 (H) 06/14/2018   PLT 117 (L) 06/14/2018    BMET    Component Value Date/Time   NA 135 06/14/2018 0742   NA 135 (A) 01/17/2018   K 6.9 (HH) 06/14/2018 0742   CL 98 06/14/2018 0742   CO2 25 06/14/2018 0742   GLUCOSE 81 06/14/2018 0742   BUN 47 (H) 06/14/2018 0742   BUN 67 (A) 01/17/2018   CREATININE 10.02 (H) 06/14/2018 0742   CALCIUM 9.1 06/14/2018 0742   GFRNONAA 5 (L) 06/14/2018 0742   GFRAA 6 (L) 06/14/2018 0742   COAG Lab Results  Component Value Date   INR 1.14 12/13/2017   INR 1.15 12/07/2017   INR 1.05 01/14/2016     Disposition:  Discharge to :Home Discharge Instructions    Call MD for:  redness, tenderness, or signs of infection (pain, swelling, bleeding, redness, odor or green/yellow discharge around incision site)  Complete by: As directed    Call MD for:  severe or increased pain, loss or decreased feeling  in affected limb(s)   Complete by: As directed    Call MD for:  temperature >100.5   Complete by: As directed    Resume previous diet   Complete by: As directed      Allergies as of 06/15/2018      Reactions   Furadantin [nitrofurantoin] Other (See Comments)   UNSPECIFIED REACTION    Mandelamine [methenamine] Other (See Comments)   UNSPECIFIED REACTION    Noroxin [norfloxacin] Other (See Comments)   UNSPECIFIED REACTION    Carmine Nausea Only   Contrast Media [iodinated Diagnostic Agents] Nausea And Vomiting   Oral dye causes vomiting, IV dye is okay   Hydrocodone Other (See Comments)   Caused involuntary movement and twitching. CAN NOT TAKE DUE TO MUSCLE SPASMS AND MUSCLE TREMORS    Metrizamide Nausea And Vomiting   Oral dye causes vomiting, IV dye is okay   Sulfa Antibiotics Cough   Childhood reaction - pt could not confirm that it was a cough   Sulfur Cough   Childhood reaction - pt could not confirm that it was a cough      Medication List    TAKE these medications   aspirin 325 MG EC tablet Take 325 mg by mouth daily.   calcium acetate 667 MG capsule Commonly known as: PHOSLO Take 3-4 capsules (2,001-2,668 mg total) by mouth See admin instructions. Take 2708 mg by mouth 3 times daily with meals, take 2001 mg by mouth with snacks   clopidogrel 75 MG tablet Commonly known as: PLAVIX Take 1 tablet (75 mg total) by mouth daily.   famotidine 40 MG tablet Commonly known as: PEPCID Take 1 tablet (40 mg total) by mouth daily at 2 PM.   fluticasone 50 MCG/ACT nasal spray Commonly known as: FLONASE Place 2 sprays into both nostrils as needed for allergies or rhinitis (congestion).   levothyroxine 125 MCG tablet Commonly known as: SYNTHROID Take 1 tablet (125 mcg total) by mouth daily before breakfast.   methocarbamol 500 MG tablet Commonly known as: ROBAXIN Take 1 tablet (500 mg total) by mouth every 6 (six) hours as needed for muscle spasms.   midodrine 5 MG tablet Commonly known as: PROAMATINE Take 1 tablet (5 mg total) by mouth 3 (three) times daily with meals.   NEPRO PO Take 1 Can by mouth daily.   oxyCODONE-acetaminophen 10-325 MG tablet Commonly known as: PERCOCET Take 1 tablet by mouth every 6 (six) hours as needed for pain. What changed:   when to take this  reasons to take this   Polyethylene Glycol 3350 Powd Take 17 g by mouth daily as needed. [place] 1 CAPFUL (17GM) IN 4-8OZ. OF WATER AND DRINK BY MOUTH DAILY AS NEEDED FOR MILD CONSTIPATION   promethazine 25 MG tablet Commonly known as: PHENERGAN Take 1 tablet (25 mg total) by mouth 2 (two) times daily as needed for nausea or vomiting.   sodium polystyrene powder Commonly known as:  KAYEXALATE Take 15 g by mouth See admin instructions. Take 15 g (mixed in water) by mouth on Saturdays and Sundays   sodium zirconium cyclosilicate 10 g Pack packet Commonly known as: Lokelma Take 10 g by mouth daily. TAKE 10GM PACK ON OFF DIALYSIS DAYS ON TUESDAY ON THURSDAY ON SUNDAY   traZODone 100 MG tablet Commonly known as: DESYREL Take 1 tablet (100 mg total) by mouth at bedtime.  zolpidem 10 MG tablet Commonly known as: AMBIEN Take 1 tablet (10 mg total) by mouth at bedtime.      Verbal and written Discharge instructions given to the patient. Wound care per Discharge AVS Follow-up Information    Early, Arvilla Meres, MD Follow up in 2 week(s).   Specialties: Vascular Surgery, Cardiology Contact information: [address] [place] [address] [phone]           Signed: Roxy Horseman 06/18/2018, 4:36 PM

## 2018-06-18 NOTE — Anesthesia Postprocedure Evaluation (Signed)
Anesthesia Post Note  Patient: [name]  Procedure(s) Performed: Removal of infected left thigh graft (Left Leg Upper)     Patient location during evaluation: PACU Anesthesia Type: MAC Level of consciousness: awake and alert Pain management: pain level controlled Vital Signs Assessment: post-procedure vital signs reviewed and stable Respiratory status: spontaneous breathing, nonlabored ventilation, respiratory function stable and patient connected to nasal cannula oxygen Cardiovascular status: blood pressure returned to baseline and stable Postop Assessment: no apparent nausea or vomiting Anesthetic complications: no    Last Vitals:  Vitals:   06/15/18 0537 06/15/18 1301  BP: (!) 91/51 99/63  Pulse: 67 68  Resp:  16  Temp: 36.8 C 37 C  SpO2: 98%     Last Pain:  Vitals:   06/15/18 1301  TempSrc: Oral  PainSc: 6                  Saysha Menta

## 2018-06-24 ENCOUNTER — Encounter (HOSPITAL_COMMUNITY): Payer: Self-pay | Admitting: Vascular Surgery

## 2018-06-28 NOTE — Progress Notes (Signed)
POST OPERATIVE OFFICE NOTE    CC:  F/u for surgery  HPI:  This is a 53 y.o. male who presented to the emergency room with exposed Gore-Tex graft in his left thigh.  He has a history of end-stage renal disease dating back at least 20 years.  He has had multiple accesses in all 4 extremities.  He has had no further options for arteriovenous access and is currently being dialyzed via a right femoral catheter that courses through a collateral into his cava.  This was felt to be 1 of his last access options.  He presents today with purulent drainage and exposed Gore-Tex graft and a non-functional left thigh Gore-Tex graft.  He has multiple prior grafts in this leg.  All nonfunctional.  On 06/13/2018, he underwent excision of exposed portion of Gore-Tex graft and closure of the left thigh by Dr. Donnetta Hutching on 06/13/2018.  He presents today for follow up.  He has done well since discharge.  He states there has been no drainage and he has not had any fevers.    Allergies  Allergen Reactions  . Furadantin [Nitrofurantoin] Other (See Comments)    UNSPECIFIED REACTION   . Mandelamine [Methenamine] Other (See Comments)    UNSPECIFIED REACTION   . Noroxin [Norfloxacin] Other (See Comments)    UNSPECIFIED REACTION   . Carmine Nausea Only  . Contrast Media [Iodinated Diagnostic Agents] Nausea And Vomiting    Oral dye causes vomiting, IV dye is okay  . Hydrocodone Other (See Comments)    Caused involuntary movement and twitching. CAN NOT TAKE DUE TO MUSCLE SPASMS AND MUSCLE TREMORS  . Metrizamide Nausea And Vomiting    Oral dye causes vomiting, IV dye is okay  . Sulfa Antibiotics Cough    Childhood reaction - pt could not confirm that it was a cough  . Sulfur Cough    Childhood reaction - pt could not confirm that it was a cough    Current Outpatient Medications  Medication Sig Dispense Refill  . aspirin 325 MG EC tablet Take 325 mg by mouth daily.    . calcium acetate (PHOSLO) 667 MG capsule Take 3-4  capsules (2,001-2,668 mg total) by mouth See admin instructions. Take 2708 mg by mouth 3 times daily with meals, take 2001 mg by mouth with snacks 450 capsule 0  . clopidogrel (PLAVIX) 75 MG tablet Take 1 tablet (75 mg total) by mouth daily. 30 tablet 0  . famotidine (PEPCID) 40 MG tablet Take 1 tablet (40 mg total) by mouth daily at 2 PM. 30 tablet 0  . fluticasone (FLONASE) 50 MCG/ACT nasal spray Place 2 sprays into both nostrils as needed for allergies or rhinitis (congestion). 0.003 g 0  . levothyroxine (SYNTHROID, LEVOTHROID) 125 MCG tablet Take 1 tablet (125 mcg total) by mouth daily before breakfast. 30 tablet 0  . methocarbamol (ROBAXIN) 500 MG tablet Take 1 tablet (500 mg total) by mouth every 6 (six) hours as needed for muscle spasms. (Patient not taking: Reported on 06/12/2018) 30 tablet 0  . midodrine (PROAMATINE) 5 MG tablet Take 1 tablet (5 mg total) by mouth 3 (three) times daily with meals. (Patient not taking: Reported on 06/12/2018) 90 tablet 0  . Nutritional Supplements (NEPRO PO) Take 1 Can by mouth daily.    Marland Kitchen oxyCODONE-acetaminophen (PERCOCET) 10-325 MG tablet Take 1 tablet by mouth every 6 (six) hours as needed for pain. 10 tablet 0  . Polyethylene Glycol 3350 POWD Take 17 g by mouth daily as  needed. [place] 1 CAPFUL (17GM) IN 4-8OZ. OF WATER AND DRINK BY MOUTH DAILY AS NEEDED FOR MILD CONSTIPATION (Patient not taking: Reported on 06/12/2018) 500 g 0  . promethazine (PHENERGAN) 25 MG tablet Take 1 tablet (25 mg total) by mouth 2 (two) times daily as needed for nausea or vomiting. 30 tablet 0  . sodium polystyrene (KAYEXALATE) powder Take 15 g by mouth See admin instructions. Take 15 g (mixed in water) by mouth on Saturdays and Sundays 135 g 0  . sodium zirconium cyclosilicate (LOKELMA) 10 g PACK packet Take 10 g by mouth daily. TAKE 10GM PACK ON OFF DIALYSIS DAYS ON TUESDAY ON THURSDAY ON SUNDAY (Patient not taking: Reported on 06/12/2018) 14 packet 0  . traZODone (DESYREL) 100 MG tablet  Take 1 tablet (100 mg total) by mouth at bedtime. 30 tablet 0  . zolpidem (AMBIEN) 10 MG tablet Take 1 tablet (10 mg total) by mouth at bedtime. 7 tablet 0   No current facility-administered medications for this visit.      ROS:  See HPI  Physical Exam:  Today's Vitals   07/01/18 1304  BP: 112/62  Pulse: 61  Resp: 12  Temp: 97.6 F (36.4 C)  TempSrc: Temporal  SpO2: 96%  Weight: 271 lb (122.9 kg)  Height: 5\' 11"  (1.803 m)   Body mass index is 37.8 kg/m.   Incision:  Well healed   Assessment/Plan:  This is a 53 y.o. male who is s/p: excision of exposed portion of Gore-Tex graft and closure of the left thigh by Dr. Donnetta Hutching on 06/13/2018  -incision has healed nicely-nylon sutures removed and pt tolerated well.   -f/u as needed.    Leontine Locket, PA-C [facility] [phone]  Clinic MD:  Trula Slade

## 2018-07-01 ENCOUNTER — Ambulatory Visit (INDEPENDENT_AMBULATORY_CARE_PROVIDER_SITE_OTHER): Payer: Medicare Other | Admitting: Physician Assistant

## 2018-07-01 ENCOUNTER — Other Ambulatory Visit: Payer: Self-pay

## 2018-07-01 VITALS — BP 112/62 | HR 61 | Temp 97.6°F | Resp 12 | Ht 71.0 in | Wt 271.0 lb

## 2018-07-01 DIAGNOSIS — Z992 Dependence on renal dialysis: Secondary | ICD-10-CM

## 2018-07-01 DIAGNOSIS — N186 End stage renal disease: Secondary | ICD-10-CM

## 2018-11-25 ENCOUNTER — Encounter (HOSPITAL_COMMUNITY): Payer: Self-pay | Admitting: Emergency Medicine

## 2018-11-25 ENCOUNTER — Inpatient Hospital Stay (HOSPITAL_COMMUNITY)
Admission: EM | Admit: 2018-11-25 | Discharge: 2018-12-11 | DRG: 690 | Disposition: A | Discharge status: Home Health | Payer: Medicare Other | Attending: Nephrology | Admitting: Nephrology

## 2018-11-25 ENCOUNTER — Other Ambulatory Visit: Payer: Self-pay

## 2018-11-25 DIAGNOSIS — N151 Renal and perinephric abscess: Secondary | ICD-10-CM | POA: Diagnosis present

## 2018-11-25 DIAGNOSIS — Z79890 Hormone replacement therapy (postmenopausal): Secondary | ICD-10-CM

## 2018-11-25 DIAGNOSIS — Z936 Other artificial openings of urinary tract status: Secondary | ICD-10-CM

## 2018-11-25 DIAGNOSIS — G40409 Other generalized epilepsy and epileptic syndromes, not intractable, without status epilepticus: Secondary | ICD-10-CM | POA: Diagnosis present

## 2018-11-25 DIAGNOSIS — Z87442 Personal history of urinary calculi: Secondary | ICD-10-CM

## 2018-11-25 DIAGNOSIS — E871 Hypo-osmolality and hyponatremia: Secondary | ICD-10-CM | POA: Diagnosis not present

## 2018-11-25 DIAGNOSIS — Z9119 Patient's noncompliance with other medical treatment and regimen: Secondary | ICD-10-CM

## 2018-11-25 DIAGNOSIS — Z91041 Radiographic dye allergy status: Secondary | ICD-10-CM

## 2018-11-25 DIAGNOSIS — E669 Obesity, unspecified: Secondary | ICD-10-CM | POA: Diagnosis present

## 2018-11-25 DIAGNOSIS — N136 Pyonephrosis: Principal | ICD-10-CM | POA: Diagnosis present

## 2018-11-25 DIAGNOSIS — Z888 Allergy status to other drugs, medicaments and biological substances status: Secondary | ICD-10-CM

## 2018-11-25 DIAGNOSIS — K5900 Constipation, unspecified: Secondary | ICD-10-CM | POA: Diagnosis present

## 2018-11-25 DIAGNOSIS — Z992 Dependence on renal dialysis: Secondary | ICD-10-CM

## 2018-11-25 DIAGNOSIS — Z20828 Contact with and (suspected) exposure to other viral communicable diseases: Secondary | ICD-10-CM | POA: Diagnosis present

## 2018-11-25 DIAGNOSIS — I739 Peripheral vascular disease, unspecified: Secondary | ICD-10-CM | POA: Diagnosis present

## 2018-11-25 DIAGNOSIS — N319 Neuromuscular dysfunction of bladder, unspecified: Secondary | ICD-10-CM | POA: Diagnosis present

## 2018-11-25 DIAGNOSIS — R7881 Bacteremia: Secondary | ICD-10-CM | POA: Diagnosis present

## 2018-11-25 DIAGNOSIS — Z885 Allergy status to narcotic agent status: Secondary | ICD-10-CM

## 2018-11-25 DIAGNOSIS — Z6836 Body mass index (BMI) 36.0-36.9, adult: Secondary | ICD-10-CM

## 2018-11-25 DIAGNOSIS — Z79899 Other long term (current) drug therapy: Secondary | ICD-10-CM

## 2018-11-25 DIAGNOSIS — I2510 Atherosclerotic heart disease of native coronary artery without angina pectoris: Secondary | ICD-10-CM | POA: Diagnosis present

## 2018-11-25 DIAGNOSIS — B9689 Other specified bacterial agents as the cause of diseases classified elsewhere: Secondary | ICD-10-CM | POA: Diagnosis present

## 2018-11-25 DIAGNOSIS — R933 Abnormal findings on diagnostic imaging of other parts of digestive tract: Secondary | ICD-10-CM

## 2018-11-25 DIAGNOSIS — Z932 Ileostomy status: Secondary | ICD-10-CM

## 2018-11-25 DIAGNOSIS — G894 Chronic pain syndrome: Secondary | ICD-10-CM | POA: Diagnosis present

## 2018-11-25 DIAGNOSIS — E8809 Other disorders of plasma-protein metabolism, not elsewhere classified: Secondary | ICD-10-CM | POA: Diagnosis present

## 2018-11-25 DIAGNOSIS — N186 End stage renal disease: Secondary | ICD-10-CM

## 2018-11-25 DIAGNOSIS — N1330 Unspecified hydronephrosis: Secondary | ICD-10-CM

## 2018-11-25 DIAGNOSIS — I959 Hypotension, unspecified: Secondary | ICD-10-CM | POA: Diagnosis present

## 2018-11-25 DIAGNOSIS — Z882 Allergy status to sulfonamides status: Secondary | ICD-10-CM

## 2018-11-25 DIAGNOSIS — Q059 Spina bifida, unspecified: Secondary | ICD-10-CM

## 2018-11-25 DIAGNOSIS — Z833 Family history of diabetes mellitus: Secondary | ICD-10-CM

## 2018-11-25 DIAGNOSIS — Z89412 Acquired absence of left great toe: Secondary | ICD-10-CM

## 2018-11-25 DIAGNOSIS — N2889 Other specified disorders of kidney and ureter: Secondary | ICD-10-CM | POA: Diagnosis present

## 2018-11-25 DIAGNOSIS — I120 Hypertensive chronic kidney disease with stage 5 chronic kidney disease or end stage renal disease: Secondary | ICD-10-CM | POA: Diagnosis present

## 2018-11-25 DIAGNOSIS — E039 Hypothyroidism, unspecified: Secondary | ICD-10-CM | POA: Diagnosis present

## 2018-11-25 DIAGNOSIS — D631 Anemia in chronic kidney disease: Secondary | ICD-10-CM | POA: Diagnosis present

## 2018-11-25 DIAGNOSIS — D696 Thrombocytopenia, unspecified: Secondary | ICD-10-CM | POA: Diagnosis present

## 2018-11-25 DIAGNOSIS — Z7902 Long term (current) use of antithrombotics/antiplatelets: Secondary | ICD-10-CM

## 2018-11-25 DIAGNOSIS — F419 Anxiety disorder, unspecified: Secondary | ICD-10-CM | POA: Diagnosis present

## 2018-11-25 DIAGNOSIS — R109 Unspecified abdominal pain: Secondary | ICD-10-CM | POA: Diagnosis not present

## 2018-11-25 DIAGNOSIS — N200 Calculus of kidney: Secondary | ICD-10-CM

## 2018-11-25 DIAGNOSIS — K648 Other hemorrhoids: Secondary | ICD-10-CM | POA: Diagnosis present

## 2018-11-25 DIAGNOSIS — E872 Acidosis: Secondary | ICD-10-CM | POA: Diagnosis not present

## 2018-11-25 DIAGNOSIS — Z8249 Family history of ischemic heart disease and other diseases of the circulatory system: Secondary | ICD-10-CM

## 2018-11-25 DIAGNOSIS — G4700 Insomnia, unspecified: Secondary | ICD-10-CM | POA: Diagnosis present

## 2018-11-25 DIAGNOSIS — K219 Gastro-esophageal reflux disease without esophagitis: Secondary | ICD-10-CM | POA: Diagnosis present

## 2018-11-25 DIAGNOSIS — E875 Hyperkalemia: Secondary | ICD-10-CM | POA: Diagnosis present

## 2018-11-25 DIAGNOSIS — N132 Hydronephrosis with renal and ureteral calculous obstruction: Secondary | ICD-10-CM

## 2018-11-25 DIAGNOSIS — N2581 Secondary hyperparathyroidism of renal origin: Secondary | ICD-10-CM | POA: Diagnosis present

## 2018-11-25 NOTE — ED Notes (Signed)
Pt noted to be sitting on the floor in the lobby, states he sat himself there and did not fall. Asked pt to sit back in his wheelchair or one of the chairs in the lobby, pt refused.

## 2018-11-25 NOTE — ED Triage Notes (Signed)
Pt arrives via EMS from home with chronic kidney stones. Pain for about 2 weeks that worsened today and nausea. HD pt 22G L hand, EMS gave 250 fentanyy and 4 mg zofran

## 2018-11-25 NOTE — ED Notes (Signed)
Pt states he does not make enough urine for a sample.

## 2018-11-26 ENCOUNTER — Emergency Department (HOSPITAL_COMMUNITY): Payer: Medicare Other

## 2018-11-26 ENCOUNTER — Encounter (HOSPITAL_COMMUNITY): Payer: Self-pay | Admitting: *Deleted

## 2018-11-26 DIAGNOSIS — N200 Calculus of kidney: Secondary | ICD-10-CM

## 2018-11-26 DIAGNOSIS — E039 Hypothyroidism, unspecified: Secondary | ICD-10-CM | POA: Diagnosis present

## 2018-11-26 DIAGNOSIS — I739 Peripheral vascular disease, unspecified: Secondary | ICD-10-CM | POA: Diagnosis present

## 2018-11-26 DIAGNOSIS — K648 Other hemorrhoids: Secondary | ICD-10-CM | POA: Diagnosis present

## 2018-11-26 DIAGNOSIS — N132 Hydronephrosis with renal and ureteral calculous obstruction: Secondary | ICD-10-CM | POA: Diagnosis not present

## 2018-11-26 DIAGNOSIS — Q059 Spina bifida, unspecified: Secondary | ICD-10-CM | POA: Diagnosis not present

## 2018-11-26 DIAGNOSIS — B9689 Other specified bacterial agents as the cause of diseases classified elsewhere: Secondary | ICD-10-CM | POA: Diagnosis not present

## 2018-11-26 DIAGNOSIS — D631 Anemia in chronic kidney disease: Secondary | ICD-10-CM | POA: Diagnosis present

## 2018-11-26 DIAGNOSIS — E875 Hyperkalemia: Secondary | ICD-10-CM | POA: Diagnosis present

## 2018-11-26 DIAGNOSIS — I120 Hypertensive chronic kidney disease with stage 5 chronic kidney disease or end stage renal disease: Secondary | ICD-10-CM | POA: Diagnosis present

## 2018-11-26 DIAGNOSIS — K219 Gastro-esophageal reflux disease without esophagitis: Secondary | ICD-10-CM | POA: Diagnosis present

## 2018-11-26 DIAGNOSIS — R109 Unspecified abdominal pain: Secondary | ICD-10-CM | POA: Diagnosis present

## 2018-11-26 DIAGNOSIS — E872 Acidosis: Secondary | ICD-10-CM | POA: Diagnosis not present

## 2018-11-26 DIAGNOSIS — N2581 Secondary hyperparathyroidism of renal origin: Secondary | ICD-10-CM | POA: Diagnosis present

## 2018-11-26 DIAGNOSIS — N136 Pyonephrosis: Secondary | ICD-10-CM | POA: Diagnosis present

## 2018-11-26 DIAGNOSIS — R933 Abnormal findings on diagnostic imaging of other parts of digestive tract: Secondary | ICD-10-CM | POA: Diagnosis present

## 2018-11-26 DIAGNOSIS — N186 End stage renal disease: Secondary | ICD-10-CM

## 2018-11-26 DIAGNOSIS — Z992 Dependence on renal dialysis: Secondary | ICD-10-CM

## 2018-11-26 DIAGNOSIS — R10A2 Flank pain, left side: Secondary | ICD-10-CM | POA: Diagnosis present

## 2018-11-26 DIAGNOSIS — N319 Neuromuscular dysfunction of bladder, unspecified: Secondary | ICD-10-CM | POA: Diagnosis present

## 2018-11-26 DIAGNOSIS — Z87728 Personal history of other specified (corrected) congenital malformations of nervous system and sense organs: Secondary | ICD-10-CM | POA: Diagnosis not present

## 2018-11-26 DIAGNOSIS — Z20828 Contact with and (suspected) exposure to other viral communicable diseases: Secondary | ICD-10-CM | POA: Diagnosis present

## 2018-11-26 DIAGNOSIS — D696 Thrombocytopenia, unspecified: Secondary | ICD-10-CM | POA: Diagnosis present

## 2018-11-26 DIAGNOSIS — R7881 Bacteremia: Secondary | ICD-10-CM | POA: Diagnosis present

## 2018-11-26 DIAGNOSIS — E8809 Other disorders of plasma-protein metabolism, not elsewhere classified: Secondary | ICD-10-CM | POA: Diagnosis present

## 2018-11-26 DIAGNOSIS — E669 Obesity, unspecified: Secondary | ICD-10-CM | POA: Diagnosis present

## 2018-11-26 DIAGNOSIS — N151 Renal and perinephric abscess: Secondary | ICD-10-CM | POA: Diagnosis present

## 2018-11-26 DIAGNOSIS — E871 Hypo-osmolality and hyponatremia: Secondary | ICD-10-CM | POA: Diagnosis not present

## 2018-11-26 DIAGNOSIS — G40409 Other generalized epilepsy and epileptic syndromes, not intractable, without status epilepticus: Secondary | ICD-10-CM | POA: Diagnosis present

## 2018-11-26 LAB — COMPREHENSIVE METABOLIC PANEL
ALT: 13 U/L (ref 0–44)
AST: 15 U/L (ref 15–41)
Albumin: 3.2 g/dL — ABNORMAL LOW (ref 3.5–5.0)
Alkaline Phosphatase: 84 U/L (ref 38–126)
Anion gap: 19 — ABNORMAL HIGH (ref 5–15)
BUN: 48 mg/dL — ABNORMAL HIGH (ref 6–20)
CO2: 22 mmol/L (ref 22–32)
Calcium: 9.1 mg/dL (ref 8.9–10.3)
Chloride: 94 mmol/L — ABNORMAL LOW (ref 98–111)
Creatinine, Ser: 10.46 mg/dL — ABNORMAL HIGH (ref 0.61–1.24)
GFR calc Af Amer: 6 mL/min — ABNORMAL LOW (ref >60)
GFR calc non Af Amer: 5 mL/min — ABNORMAL LOW (ref >60)
Glucose, Bld: 129 mg/dL — ABNORMAL HIGH (ref 70–99)
Potassium: 4.3 mmol/L (ref 3.5–5.1)
Sodium: 135 mmol/L (ref 135–145)
Total Bilirubin: 1.1 mg/dL (ref 0.3–1.2)
Total Protein: 9.1 g/dL — ABNORMAL HIGH (ref 6.5–8.1)

## 2018-11-26 LAB — CBC
HCT: 33.4 % — ABNORMAL LOW (ref 39.0–52.0)
Hemoglobin: 10.4 g/dL — ABNORMAL LOW (ref 13.0–17.0)
MCH: 30.3 pg (ref 26.0–34.0)
MCHC: 31.1 g/dL (ref 30.0–36.0)
MCV: 97.4 fL (ref 80.0–100.0)
Platelets: 213 10*3/uL (ref 150–400)
RBC: 3.43 MIL/uL — ABNORMAL LOW (ref 4.22–5.81)
RDW: 14.1 % (ref 11.5–15.5)
WBC: 13.5 10*3/uL — ABNORMAL HIGH (ref 4.0–10.5)
nRBC: 0 % (ref 0.0–0.2)

## 2018-11-26 LAB — CBC WITH DIFFERENTIAL/PLATELET
Abs Immature Granulocytes: 0.22 10*3/uL — ABNORMAL HIGH (ref 0.00–0.07)
Basophils Absolute: 0 10*3/uL (ref 0.0–0.1)
Basophils Relative: 0 %
Eosinophils Absolute: 0 10*3/uL (ref 0.0–0.5)
Eosinophils Relative: 0 %
HCT: 33.6 % — ABNORMAL LOW (ref 39.0–52.0)
Hemoglobin: 10.6 g/dL — ABNORMAL LOW (ref 13.0–17.0)
Immature Granulocytes: 2 %
Lymphocytes Relative: 2 %
Lymphs Abs: 0.2 10*3/uL — ABNORMAL LOW (ref 0.7–4.0)
MCH: 30.8 pg (ref 26.0–34.0)
MCHC: 31.5 g/dL (ref 30.0–36.0)
MCV: 97.7 fL (ref 80.0–100.0)
Monocytes Absolute: 0.5 10*3/uL (ref 0.1–1.0)
Monocytes Relative: 4 %
Neutro Abs: 11.1 10*3/uL — ABNORMAL HIGH (ref 1.7–7.7)
Neutrophils Relative %: 92 %
Platelets: 190 10*3/uL (ref 150–400)
RBC: 3.44 MIL/uL — ABNORMAL LOW (ref 4.22–5.81)
RDW: 13.9 % (ref 11.5–15.5)
WBC: 12.1 10*3/uL — ABNORMAL HIGH (ref 4.0–10.5)
nRBC: 0 % (ref 0.0–0.2)

## 2018-11-26 LAB — CREATININE, SERUM
Creatinine, Ser: 10.63 mg/dL — ABNORMAL HIGH (ref 0.61–1.24)
GFR calc Af Amer: 6 mL/min — ABNORMAL LOW (ref >60)
GFR calc non Af Amer: 5 mL/min — ABNORMAL LOW (ref >60)

## 2018-11-26 LAB — PROTIME-INR
INR: 1.2 (ref 0.8–1.2)
Prothrombin Time: 14.7 seconds (ref 11.4–15.2)

## 2018-11-26 LAB — POC SARS CORONAVIRUS 2 AG -  ED: SARS Coronavirus 2 Ag: NEGATIVE

## 2018-11-26 LAB — LIPASE, BLOOD: Lipase: 23 U/L (ref 11–51)

## 2018-11-26 LAB — SARS CORONAVIRUS 2 (TAT 6-24 HRS): SARS Coronavirus 2: NEGATIVE

## 2018-11-26 MED ORDER — OXYCODONE-ACETAMINOPHEN 5-325 MG PO TABS
1.0000 | ORAL_TABLET | Freq: Four times a day (QID) | ORAL | Status: DC | PRN
  Administered 2018-11-26 – 2018-12-01 (×16): 1 via ORAL
  Filled 2018-11-26 (×17): qty 1

## 2018-11-26 MED ORDER — TRAZODONE HCL 100 MG PO TABS
100.0000 mg | ORAL_TABLET | Freq: Every day | ORAL | Status: DC
  Administered 2018-11-26 – 2018-12-10 (×15): 100 mg via ORAL
  Filled 2018-11-26 (×16): qty 1

## 2018-11-26 MED ORDER — CHLORHEXIDINE GLUCONATE CLOTH 2 % EX PADS
6.0000 | MEDICATED_PAD | Freq: Every day | CUTANEOUS | Status: DC
  Administered 2018-11-26 – 2018-12-02 (×6): 6 via TOPICAL

## 2018-11-26 MED ORDER — NEPRO PO LIQD: Freq: Two times a day (BID) | ORAL | Status: DC

## 2018-11-26 MED ORDER — FAMOTIDINE 20 MG PO TABS
40.0000 mg | ORAL_TABLET | Freq: Every day | ORAL | Status: DC
  Administered 2018-11-26: 40 mg via ORAL
  Filled 2018-11-26: qty 2

## 2018-11-26 MED ORDER — CALCIUM ACETATE (PHOS BINDER) 667 MG PO CAPS
2668.0000 mg | ORAL_CAPSULE | Freq: Three times a day (TID) | ORAL | Status: DC
  Administered 2018-11-27: 2668 mg via ORAL
  Filled 2018-11-26 (×4): qty 4

## 2018-11-26 MED ORDER — ONDANSETRON HCL 4 MG/2ML IJ SOLN
4.0000 mg | Freq: Four times a day (QID) | INTRAMUSCULAR | Status: DC | PRN
  Administered 2018-11-27 – 2018-12-10 (×13): 4 mg via INTRAVENOUS
  Filled 2018-11-26 (×12): qty 2

## 2018-11-26 MED ORDER — HYDROMORPHONE HCL 1 MG/ML IJ SOLN
1.0000 mg | Freq: Once | INTRAMUSCULAR | Status: AC
  Administered 2018-11-26: 1 mg via INTRAVENOUS
  Filled 2018-11-26: qty 1

## 2018-11-26 MED ORDER — LORAZEPAM 1 MG PO TABS
1.0000 mg | ORAL_TABLET | Freq: Every day | ORAL | Status: DC
  Administered 2018-11-26 – 2018-12-11 (×15): 1 mg via ORAL
  Filled 2018-11-26 (×15): qty 1

## 2018-11-26 MED ORDER — NEPRO/CARBSTEADY PO LIQD
237.0000 mL | Freq: Two times a day (BID) | ORAL | Status: DC
  Administered 2018-11-26 – 2018-12-10 (×19): 237 mL via ORAL
  Filled 2018-11-26 (×5): qty 237
  Filled 2018-11-26: qty 474
  Filled 2018-11-26 (×3): qty 237

## 2018-11-26 MED ORDER — HEPARIN SODIUM (PORCINE) 5000 UNIT/ML IJ SOLN
5000.0000 [IU] | Freq: Three times a day (TID) | INTRAMUSCULAR | Status: DC
  Administered 2018-11-26 – 2018-11-27 (×3): 5000 [IU] via SUBCUTANEOUS
  Filled 2018-11-26 (×4): qty 1

## 2018-11-26 MED ORDER — CALCIUM ACETATE (PHOS BINDER) 667 MG PO CAPS
2001.0000 mg | ORAL_CAPSULE | ORAL | Status: DC | PRN
  Filled 2018-11-26: qty 3

## 2018-11-26 MED ORDER — SODIUM POLYSTYRENE SULFONATE PO POWD
15.0000 g | ORAL | Status: DC
  Administered 2018-12-07: 15 g via ORAL
  Filled 2018-11-26 (×5): qty 15

## 2018-11-26 MED ORDER — CLOPIDOGREL BISULFATE 75 MG PO TABS
75.0000 mg | ORAL_TABLET | Freq: Every day | ORAL | Status: DC
  Administered 2018-11-26 – 2018-11-27 (×2): 75 mg via ORAL
  Filled 2018-11-26 (×2): qty 1

## 2018-11-26 MED ORDER — SODIUM CHLORIDE 0.9 % IV SOLN
1.0000 g | INTRAVENOUS | Status: DC
  Administered 2018-11-27 – 2018-11-30 (×5): 1 g via INTRAVENOUS
  Filled 2018-11-26: qty 10
  Filled 2018-11-26 (×5): qty 1

## 2018-11-26 MED ORDER — HYDROMORPHONE HCL 1 MG/ML IJ SOLN
1.0000 mg | Freq: Four times a day (QID) | INTRAMUSCULAR | Status: DC | PRN
  Administered 2018-11-26 – 2018-11-28 (×6): 1 mg via INTRAVENOUS
  Filled 2018-11-26 (×6): qty 1

## 2018-11-26 MED ORDER — OXYCODONE-ACETAMINOPHEN 10-325 MG PO TABS: 1.0000 | ORAL_TABLET | Freq: Four times a day (QID) | ORAL | Status: DC | PRN

## 2018-11-26 MED ORDER — FLUTICASONE PROPIONATE 50 MCG/ACT NA SUSP
2.0000 | NASAL | Status: DC | PRN
  Filled 2018-11-26: qty 16

## 2018-11-26 MED ORDER — OXYCODONE HCL 5 MG PO TABS
5.0000 mg | ORAL_TABLET | Freq: Four times a day (QID) | ORAL | Status: DC | PRN
  Administered 2018-11-26 – 2018-12-11 (×38): 5 mg via ORAL
  Filled 2018-11-26 (×40): qty 1

## 2018-11-26 MED ORDER — MORPHINE SULFATE (PF) 4 MG/ML IV SOLN
4.0000 mg | Freq: Once | INTRAVENOUS | Status: AC
  Administered 2018-11-26: 4 mg via INTRAVENOUS
  Filled 2018-11-26: qty 1

## 2018-11-26 MED ORDER — HYDROMORPHONE HCL 1 MG/ML IJ SOLN
0.5000 mg | INTRAMUSCULAR | Status: DC | PRN
  Administered 2018-11-26 (×3): 0.5 mg via INTRAVENOUS
  Filled 2018-11-26 (×4): qty 1

## 2018-11-26 MED ORDER — ONDANSETRON HCL 4 MG/2ML IJ SOLN
4.0000 mg | Freq: Once | INTRAMUSCULAR | Status: AC
  Administered 2018-11-26: 4 mg via INTRAVENOUS
  Filled 2018-11-26: qty 2

## 2018-11-26 MED ORDER — ASPIRIN EC 325 MG PO TBEC
325.0000 mg | DELAYED_RELEASE_TABLET | Freq: Every day | ORAL | Status: DC
  Administered 2018-11-26 – 2018-11-27 (×2): 325 mg via ORAL
  Filled 2018-11-26 (×2): qty 1

## 2018-11-26 MED ORDER — ZOLPIDEM TARTRATE 5 MG PO TABS
10.0000 mg | ORAL_TABLET | Freq: Once | ORAL | Status: AC
  Administered 2018-11-26: 10 mg via ORAL
  Filled 2018-11-26: qty 2

## 2018-11-26 MED ORDER — PROMETHAZINE HCL 25 MG/ML IJ SOLN
25.0000 mg | Freq: Once | INTRAMUSCULAR | Status: AC
  Administered 2018-11-27: 01:00:00 25 mg via INTRAVENOUS

## 2018-11-26 MED ORDER — LEVOTHYROXINE SODIUM 25 MCG PO TABS
125.0000 ug | ORAL_TABLET | Freq: Every day | ORAL | Status: DC
  Administered 2018-11-26 – 2018-12-11 (×15): 125 ug via ORAL
  Filled 2018-11-26 (×16): qty 1

## 2018-11-26 MED ORDER — FAMOTIDINE 20 MG PO TABS
20.0000 mg | ORAL_TABLET | Freq: Every day | ORAL | Status: DC
  Administered 2018-11-27 – 2018-12-11 (×14): 20 mg via ORAL
  Filled 2018-11-26 (×14): qty 1

## 2018-11-26 MED ORDER — ACETAMINOPHEN 650 MG RE SUPP: 650.0000 mg | Freq: Four times a day (QID) | RECTAL | Status: DC | PRN

## 2018-11-26 MED ORDER — ALTEPLASE 2 MG IJ SOLR
4.0000 mg | Freq: Once | INTRAMUSCULAR | Status: AC
  Administered 2018-11-26: 4 mg

## 2018-11-26 MED ORDER — ALTEPLASE 2 MG IJ SOLR
INTRAMUSCULAR | Status: AC
  Filled 2018-11-26: qty 4

## 2018-11-26 MED ORDER — ACETAMINOPHEN 325 MG PO TABS
650.0000 mg | ORAL_TABLET | Freq: Four times a day (QID) | ORAL | Status: DC | PRN
  Administered 2018-11-26: 650 mg via ORAL
  Filled 2018-11-26 (×2): qty 2

## 2018-11-26 NOTE — ED Notes (Signed)
Patient requested berry flavor nepro called pharmacy who will send berry flavor

## 2018-11-26 NOTE — Progress Notes (Signed)
  Request for percutaneous nephrostomy tube placement.  Images reviewed by Dr. Pascal Lux.  Hydronephrosis is small. Patient with ESRD on hemodialysis with minimal urine production.  Risks and benefits of left PCN placement was discussed with the patient including, but not limited to, pain, infection, bleeding, significant bleeding causing loss or decrease in renal function or damage to adjacent structures.   He confirms he has had PCNs in the past and they have been uncomfortable for him.  He doesn't feel the PCN will help his pain as he states his pain is more "down toward the groin area".  He tells me he want's his "kidney removed so he never has to deal with this again."  Patient did not consent for procedure.  Addaline Peplinski S Delorice Bannister PA-C 11/26/2018 2:20 PM

## 2018-11-26 NOTE — Consult Note (Addendum)
Urology Consult  CC: left ureteral calcluli Referring physician: Thayer Jew, MD Reason for referral: patient in ER  Impression/Assessment: 53 year old man with a history of a neurogenic bladder managed by ileal conduit (bladder still in place) with left ureteral calculi and moderate hydronephrosis with calyceal rupture and significant pain.   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  Noncontrast CT the abdomen and pelvis shows distal ureteral calculi as well as a 2 mm proximal calculus with moderate hydronephrosis, perinephric stranding and some calyceal rupture.  Additionally there is bilateral renal atrophy (right > left).  Patient is not febrile but has had nausea associated with pain which is poorly controlled.   He has not had any urologic care for the last 10 years and prior care was in [place].  He was told his kidney does not function very well and that he should have it removed.  He is on hemodialysis.  He makes very little urine and empties his ileal conduit bag 1 time a day.  He still has a bladder.    Past Medical History:  Diagnosis Date   Anemia    Anxiety    Constipation    End stage renal failure on dialysis [facility])    [place]; MWF, on HD since 1997, exhausted upper ext access, and possibly LE access; cath dependent in R groin as of 06/2018   GERD (gastroesophageal reflux disease)    Grand mal seizure ([place]) X 4   last 1998 (02/29/2016)   Heart murmur    no problems  with per pt. - nephrologist and Dr. Coletta Memos   History of blood transfusion 2000s   "I was having blood loss; never found out from where"   History of kidney stones    Hypertension    hx of - has not taken bp meds in over 2 years   Hypothyroidism    Insomnia    Migraine    "due to my BP in my late 1990s; none since" (02/29/2016)   Nonhealing surgical wound    of the left arteriovenous graft   Pneumonia    x 2   Shortness of breath    rarely   Spina bifida [facility])    Past Surgical History:  Procedure Laterality Date   AMPUTATION Left 12/11/2017   Procedure: Left Great Toe Amputation;  Surgeon: [name], MD;  Location: [place];  Service: Orthopedics;  Laterality: Left;   APPENDECTOMY     ARTERIOVENOUS GRAFT PLACEMENT Left 10/16/2012   left femoral goretex graft         Dr Donnetta Hutching   AV FISTULA PLACEMENT Left 06/27/2012   Procedure: EXPLORATORY LEFT THY-GRAFT PSEUDO-ANEURYSM;  Surgeon: Conrad Berryville, MD;  Location: [place];  Service: Vascular;  Laterality: Left;  Revision of left Arteriovenus gortex graft in thigh.   COLONOSCOPY     I&D EXTREMITY Left 02/29/2016   Procedure: DEBRIDEMENT LEFT THIGH WOUND;  Surgeon: Waynetta Sandy, MD;  Location: [place];  Service: Vascular;  Laterality: Left;   ILEOSTOMY  1970s  INCISION AND DRAINAGE Left 10/16/2012   Procedure: INCISION AND Debridement left thigh graft;  Surgeon: Rosetta Posner, MD;  Location: [place];  Service: Vascular;  Laterality: Left;   INSERTION OF DIALYSIS CATHETER     INSERTION OF DIALYSIS CATHETER  01/14/2016   Procedure: INSERTION OF DIALYSIS CATHETER;  Surgeon: Waynetta Sandy, MD;  Location: [place];  Service: Vascular;;   INSERTION OF DIALYSIS CATHETER Right 08/07/2017   Procedure: INSERTION OF DIALYSIS CATHETER;  Surgeon: Waynetta Sandy, MD;  Location: [place];  Service: Vascular;  Laterality: Right;   INSERTION OF DIALYSIS CATHETER Right 12/21/2017   Procedure: RIGHT FEMORAL DIALYSIS  CATHETER EXCHANGE;  Surgeon: Marty Heck, MD;  Location: [place];  Service: Vascular;  Laterality: Right;   INSERTION OF ILIAC STENT Left 01/14/2016   Procedure: INSERTION OF ILIAC STENT;  Surgeon: Waynetta Sandy, MD;  Location: [place];  Service: Vascular;  Laterality: Left;   INTRAOPERATIVE ARTERIOGRAM Left 01/14/2016   Procedure: INTRA OPERATIVE ARTERIOGRAM;  Surgeon: Waynetta Sandy, MD;  Location: [place];  Service: Vascular;  Laterality: Left;   LITHOTRIPSY  x 3   "& a laser treatment" (02/29/2016)   NEPHROSTOMY Bilateral 1968   PATELLAR TENDON REPAIR Right 1990s   "big incision"   PERIPHERAL VASCULAR CATHETERIZATION  01/14/2016   Procedure: A/V SHUNTOGRAM;  Surgeon: Waynetta Sandy, MD;  Location: [place];  Service: Vascular;;   REVISION OF ARTERIOVENOUS GORETEX GRAFT Left 10/16/2012   Procedure: REVISION OF LEFT FEMORAL LOOP ARTERIOVENOUS GORETEX GRAFT;  Surgeon: Rosetta Posner, MD;  Location: [place];  Service: Vascular;  Laterality: Left;   REVISION OF ARTERIOVENOUS GORETEX GRAFT Left 02/11/2015   Procedure: EXCISION OF SMALL SEGMENT OF EXPOSED LEFT THIGH NON FUNCTIONING  ARTERIOVENOUS GORETEX GRAFT;  Surgeon: Mal Misty, MD;  Location: [place];  Service: Vascular;  Laterality: Left;   REVISION OF ARTERIOVENOUS GORETEX GRAFT Left 01/14/2016   Procedure: REVISION OF ARTERIOVENOUS GORETEX GRAFT;  Surgeon: Waynetta Sandy, MD;  Location: [place];  Service: Vascular;  Laterality: Left;   REVISION OF ARTERIOVENOUS GORETEX GRAFT Left 02/29/2016   thigh/pt report   REVISION OF ARTERIOVENOUS GORETEX GRAFT Left 02/29/2016   Procedure: POSSIBLE REVISION OF LEFT THIGH ARTERIOVENOUS GORETEX GRAFT;  Surgeon: Waynetta Sandy, MD;  Location: [place];  Service: Vascular;  Laterality: Left;   TEE WITHOUT CARDIOVERSION N/A 12/14/2017   Procedure: TRANSESOPHAGEAL ECHOCARDIOGRAM (TEE);  Surgeon: Acie Fredrickson, Wonda Cheng, MD;  Location: [place];  Service:  Cardiovascular;  Laterality: N/A;   THROMBECTOMY AND REVISION OF ARTERIOVENTOUS (AV) GORETEX  GRAFT Left 06/12/2018   Procedure: Removal of infected left thigh graft;  Surgeon: Rosetta Posner, MD;  Location: [place];  Service: Vascular;  Laterality: Left;   THROMBECTOMY W/ EMBOLECTOMY Left 01/14/2016   Procedure: THROMBECTOMY ARTERIOVENOUS GORE-TEX Left thigh GRAFT;  Surgeon: Waynetta Sandy, MD;  Location: [place];  Service: Vascular;  Laterality: Left;   TONGUE SURGERY  ~ 1990   tongue-tie release    ULTRASOUND GUIDANCE FOR VASCULAR ACCESS Right 08/07/2017   Procedure: ULTRASOUND GUIDANCE FOR VASCULAR CANNULATION RIGHT FEMORAL VEIN AND LEFT AV FEMORAL GRAFT;  Surgeon: Waynetta Sandy, MD;  Location: [place];  Service: Vascular;  Laterality: Right;   UPPER EXTREMITY VENOGRAPHY Bilateral 08/09/2017   Procedure: UPPER EXTREMITY VENOGRAPHY;  Surgeon: Serafina Mitchell, MD;  Location: [place] [facility];  Service: Cardiovascular;  Laterality: Bilateral;   VENOGRAM N/A 09/20/2017   Procedure: RIGHT COMMON FEMORAL ARTERY EXPLORATION.  CANNULATION RIGHT COMMON FEMORAL VEIN. VENOGRAM CENTRAL ULTRA SOUND GUIDED RIGHT FEMORAL VEIN TIMES TWO.;  Surgeon: Waynetta Sandy, MD;  Location: [place];  Service: Vascular;  Laterality: N/A;   WOUND DEBRIDEMENT Left 02/29/2016   thigh    Medications: I have reviewed the patient's current medications.  Allergies:  Allergies  Allergen Reactions   Hydrocodone Other (See Comments)    Caused involuntary movement and twitching. CANNOT TAKE DUE TO MUSCLE SPASMS AND MUSCLE TREMORS   Furadantin [Nitrofurantoin] Other (See Comments)    UNSPECIFIED REACTION    Mandelamine [Methenamine] Other (See Comments)    UNSPECIFIED REACTION    Noroxin [Norfloxacin] Other (See Comments)    UNSPECIFIED REACTION    Carmine Nausea Only   Contrast Media [Iodinated Diagnostic Agents] Nausea And Vomiting    Oral dye causes vomiting, IV dye is okay    Metrizamide Nausea And Vomiting    Oral dye causes vomiting, IV dye is okay   Sulfa Antibiotics Other (See Comments) and Cough    Childhood reaction - pt could not confirm that it was a cough   Sulfur Cough    Childhood reaction - pt could not confirm that it was a cough    Family History  Problem Relation Age of Onset   Diabetes Mother    Hypertension Mother    Heart disease Mother        before age 49   Diabetes Father    Heart attack Father        X's 3   Diabetes Sister    Bipolar disorder Sister     Social History:  reports that he has never smoked. He has never used smokeless tobacco. He reports previous alcohol use. He reports previous drug use. Drug: Marijuana.  Review of Systems (10 point): Pertinent items are noted in HPI. A comprehensive review of systems was negative except as noted above.  Physical Exam:  Vital signs in last 24 hours: Temp:  [97.5 F (36.4 C)-98.2 F (36.8 C)] 97.5 F (36.4 C) (11/24 0247) Pulse Rate:  [78-97] 86 (11/24 0545) Resp:  [15-26] 15 (11/24 0545) BP: (107-153)/(64-94) 136/68 (11/24 0545) SpO2:  [90 %-100 %] 100 % (11/24 0545) General appearance: alert and appears stated age, patient bent over in pain Head: Normocephalic, without obvious abnormality, atraumatic Eyes: conjunctivae/corneas clear. EOM's intact.  Oropharynx: moist mucous membranes Neck: supple, symmetrical, trachea midline Resp: normal respiratory effort Cardio: regular rate and rhythm Back: symmetric, no curvature. ROM normal. No CVA tenderness. GI: soft, non-tender; ileal conduit with scant urine in bag Extremities: extremities normal, atraumatic, no cyanosis or edema Skin: Skin color normal. No visible rashes or lesions Neurologic: Grossly normal  Laboratory Data:  Recent Labs    11/26/18 0138  WBC 12.1*  HGB 10.6*  HCT 33.6*   BMET Recent Labs    11/26/18 0138  NA 135  K 4.3  CL 94*  CO2 22  GLUCOSE 129*  BUN 48*  CREATININE 10.46*    CALCIUM 9.1   No results for input(s): LABPT, INR in the last 72 hours. No results for input(s): LABURIN in the last 72 hours. Results for orders placed or performed during the hospital encounter of 06/12/18  Novel Coronavirus,NAA,(SEND-OUT TO REF LAB - TAT 24-48 hrs); Hosp Order     Status: None   Collection Time: 06/12/18  2:25 PM   Specimen: Nasopharyngeal Swab; Respiratory  Result Value Ref Range Status   SARS-CoV-2, NAA NOT DETECTED NOT DETECTED Final  Comment: (NOTE) Testing was performed using the cobas(R) SARS-CoV-2 test. This test was developed and its performance characteristics determined by [employer]. This test has not been FDA cleared or approved. This test has been authorized by FDA under an Emergency Use Authorization (EUA). This test is only authorized for the duration of time the declaration that circumstances exist justifying the authorization of the emergency use of in vitro diagnostic tests for detection of SARS-CoV-2 virus and/or diagnosis of COVID-19 infection under section 564(b)(1) of the Act, 21 U.S.C. [id number]), unless the authorization is terminated or revoked sooner. When diagnostic testing is negative, the possibility of a false negative result should be considered in the context of a patient's recent exposures and the presence of clinical signs and symptoms consistent with COVID-19. An individual without symptoms of COVID-19 and who is not shedding SARS-CoV-2 virus would expect to have  a negative (not detected) result in this assay. Performed At: [facility] [address] [place], [place] [id number] Rush Farmer MD [id number]    [place]  Final    Comment: Performed at [facility], [place] [address], [place], [place] [address]  Aerobic/Anaerobic Culture (surgical/deep wound)     Status: None   Collection Time: 06/12/18  6:10 PM   Specimen: Other Source; Wound  Result Value Ref Range  Status   Specimen Description WOUND LEFT THIGH  Final   Special Requests PATIENT ON FOLLOWING ANCEF  Final   Gram Stain   Final    ABUNDANT WBC PRESENT,BOTH PMN AND MONONUCLEAR ABUNDANT GRAM NEGATIVE RODS ABUNDANT GRAM POSITIVE COCCI FEW GRAM VARIABLE ROD Performed at [facility], [place] [address], [place], [place] [address]    Culture   Final    ABUNDANT DIPHTHEROIDS(CORYNEBACTERIUM SPECIES) MIXED ANAEROBIC FLORA PRESENT.  CALL LAB IF FURTHER IID REQUIRED.    Report Status 06/15/2018 FINAL  Final   Creatinine: Recent Labs    11/26/18 0138  CREATININE 10.46*    Imaging: Ct Renal Stone Study  Result Date: 11/26/2018 CLINICAL DATA:  Flank pain, recurrent stone disease suspected EXAM: CT ABDOMEN AND PELVIS WITHOUT CONTRAST TECHNIQUE: Multidetector CT imaging of the abdomen and pelvis was performed following the standard protocol without IV contrast. COMPARISON:  CT abdomen pelvis 12/08/2017 FINDINGS: Lower chest: Bandlike areas of scarring and/or atelectasis. Coarse calcification of the mitral annulus. Extensive calcifications upon the aortic leaflets. Three-vessel coronary artery disease. Normal cardiac size. No pericardial effusion. Hepatobiliary: No focal liver lesions. Moderate distention of the gallbladder without visible calcified gallstones or biliary ductal dilatation. Pancreas: Unremarkable. No pancreatic ductal dilatation or surrounding inflammatory changes. Spleen: Splenomegaly. No focal splenic lesions. Adrenals/Urinary Tract: Normal adrenal glands. Bilateral renal atrophy. Asymmetric enlargement of the left kidney with moderate hydroureteronephrosis. Punctate 2 mm calcification seen in the proximal left ureter (3/50 and additional radiodensity in the distal left ureter (3/59) which takes a markedly medialized course almost crossing midline prior to proceeding inferiorly to the decompressed urinary bladder. There is extensive left perinephric stranding and trace free fluid as  well as periureteral stranding and urothelial thickening. Fluid attenuation cyst in the interpolar left kidney is noted. Additional fluid attenuation cysts seen in the lower pole right kidney. Additional nonobstructive bilateral nephrolithiasis is present Stomach/Bowel: Distal esophagus, stomach and duodenal sweep are unremarkable. No small bowel wall thickening or dilatation. Right lower quadrant ileostomy with peristomal herniation of several additional loops of nonobstructed small bowel. A normal appendix is visualized. No colonic dilatation or wall thickening. High attenuation inspissated material  noted within the colon. There is circumferential rectal wall thickening, nonspecific. Vascular/Lymphatic: Atherosclerotic plaque within the normal caliber aorta. Right iliac vein central venous catheter tip terminates at the mid IVC. Left iliac vein stent graft is noted. Luminal evaluation precluded on this non contrasted exam. 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 Venous collateralization the low anterior abdomen and upper abdomen as well. Reactive adenopathy in the retroperitoneum. No pathologically enlarged nodes. Reproductive: The prostate and seminal vesicles are unremarkable. Other: Circumferential body wall edema. Retroperitoneal stranding and fluid centered upon the left kidney and ureter. No free air. Small right fat containing inguinal hernia. Bowel containing peristomal hernia, as above. Postsurgical changes of the low anterior abdomen. Musculoskeletal: Muscular atrophy of the abdominal wall and pelvic girdle. Multilevel degenerative changes are present in the imaged portions of the spine. Endplate sclerosis compatible with renal osteodystrophy. Degenerative changes noted in the spine, hips and SI joints. IMPRESSION: 1. Asymmetric enlargement of the left kidney with moderate hydroureteronephrosis and extensive left perinephric stranding and  trace free fluid. Punctate 2 mm calcification seen in the proximal left ureter (3/50) and additional radiodensity in the distal left ureter (3/59) which takes a markedly medialized course almost crossing midline prior to proceeding inferiorly to the decompressed urinary bladder. Free fluid about the kidney and proximal ureter may reflect a calyceal rupture. 2. Additional nonobstructive bilateral nephrolithiasis. 3. Bilateral renal atrophy and cortical thinning. 4. Right lower quadrant ileostomy with peristomal herniation of several additional loops of nonobstructed small bowel. 5. Circumferential rectal wall thickening, nonspecific, but could be seen with proctitis though should be correlated with direct visualization to exclude underlying malignancy. 6. Circumferential body wall edema. 7. Sclerotic changes of the axial skeleton compatible with renal osteodystrophy. 8. Vascular stenting and bypass grafting, as detailed above. Right lower extremity venous catheter terminates in the IVC. 9. Distention of the gallbladder without visible cholelithiasis or choledocholithiasis. Correlate with abdominal symptoms. If warranted, right upper quadrant ultrasound could be obtained. 10. Aortic Atherosclerosis (ICD10-I70.0). Electronically Signed   By: Lovena Le M.D.   On: 11/26/2018 01:53       Tehila Sokolow D [place] 11/26/2018, 6:15 AM

## 2018-11-26 NOTE — ED Notes (Signed)
Spoke with PA stated if POC COVID is positive do not need other test. If negative will need second covid test.

## 2018-11-26 NOTE — H&P (Addendum)
TRH H&P    Patient Demographics:    [name], is a 53 y.o. male  MRN: [id number]  DOB - [birth year]  Admit Date - 11/25/2018  Referring MD/NP/PA:   Thayer Jew  Outpatient Primary MD for the patient is [name], MD  Patient coming from:  home  Chief complaint-  L flank pain   HPI:    [name]  is a 53 y.o. male,  w spina bifida,  hypothyroidism, hypertension, ESRD on HD, nephrolithiasis , apparently c/o left flank painx 2 week which has now moved down to left groin.  Pt denies fever, chills, n/v, .   In ED T 98.2, P 94, R 20, Bp 128/82  Pox 96% on RA  CT abd/ pelvis IMPRESSION: 1. Asymmetric enlargement of the left kidney with moderate hydroureteronephrosis and extensive left perinephric stranding and trace free fluid. Punctate 2 mm calcification seen in the proximal left ureter (3/50) and additional radiodensity in the distal left ureter (3/59) which takes a markedly medialized course almost crossing midline prior to proceeding inferiorly to the decompressed urinary bladder. Free fluid about the kidney and proximal ureter may reflect a calyceal rupture. 2. Additional nonobstructive bilateral nephrolithiasis. 3. Bilateral renal atrophy and cortical thinning. 4. Right lower quadrant ileostomy with peristomal herniation of several additional loops of nonobstructed small bowel. 5. Circumferential rectal wall thickening, nonspecific, but could be seen with proctitis though should be correlated with direct visualization to exclude underlying malignancy. 6. Circumferential body wall edema. 7. Sclerotic changes of the axial skeleton compatible with renal osteodystrophy. 8. Vascular stenting and bypass grafting, as detailed above. Right lower extremity venous catheter terminates in the IVC. 9. Distention of the gallbladder without visible cholelithiasis or choledocholithiasis.  Correlate with abdominal symptoms. If warranted, right upper quadrant ultrasound could be obtained. 10. Aortic Atherosclerosis (ICD10-I70.0).   Wbc 12.1, hgb 10.6, Plt 190 Na 135, K 4.3, Bun 48, Creatinine 10.46 Ast 15, Alt 13 Lipase 23  ED consulted urology, appreciate input  Pt will be admitted for R flank pain, ? Calyceal rupture.     Review of systems:    In addition to the HPI above,  No Fever-chills, No Headache, No changes with Vision or hearing, No problems swallowing food or Liquids, No Chest pain, Cough or Shortness of Breath, No Abdominal pain, No Nausea or Vomiting, bowel movements are regular, No Blood in stool or Urine, No dysuria, No new skin rashes or bruises, No new joints pains-aches,  No new weakness, tingling, numbness in any extremity, No recent weight gain or loss, No polyuria, polydypsia or polyphagia, No significant Mental Stressors.  All other systems reviewed and are negative.    Past History of the following :    Past Medical History:  Diagnosis Date   Anemia    Anxiety    Constipation    End stage renal failure on dialysis [facility])    [place]; MWF, on HD since 1997, exhausted upper ext access, and possibly LE access; cath dependent in R groin as of 06/2018   GERD (  gastroesophageal reflux disease)    Grand mal seizure ([place]) X 4   last 1998 (02/29/2016)   Heart murmur    no problems with per pt. - nephrologist and Dr. Coletta Memos   History of blood transfusion 2000s   "I was having blood loss; never found out from where"   History of kidney stones    Hypertension    hx of - has not taken bp meds in over 2 years   Hypothyroidism    Insomnia    Migraine    "due to my BP in my late 1990s; none since" (02/29/2016)   Nonhealing surgical wound    of the left arteriovenous graft   Pneumonia    x 2   Shortness of breath    rarely   Spina bifida [facility])       Past Surgical History:  Procedure Laterality Date   AMPUTATION  Left 12/11/2017   Procedure: Left Great Toe Amputation;  Surgeon: [name], MD;  Location: [place];  Service: Orthopedics;  Laterality: Left;   APPENDECTOMY     ARTERIOVENOUS GRAFT PLACEMENT Left 10/16/2012   left femoral goretex graft         Dr Donnetta Hutching   AV FISTULA PLACEMENT Left 06/27/2012   Procedure: EXPLORATORY LEFT THY-GRAFT PSEUDO-ANEURYSM;  Surgeon: Conrad Ple[place], MD;  Location: [place];  Service: Vascular;  Laterality: Left;  Revision of left Arteriovenus gortex graft in thigh.   COLONOSCOPY     I&D EXTREMITY Left 02/29/2016   Procedure: DEBRIDEMENT LEFT THIGH WOUND;  Surgeon: Waynetta Sandy, MD;  Location: [place];  Service: Vascular;  Laterality: Left;   ILEOSTOMY  1970s   INCISION AND DRAINAGE Left 10/16/2012   Procedure: INCISION AND Debridement left thigh graft;  Surgeon: Rosetta Posner, MD;  Location: [place];  Service: Vascular;  Laterality: Left;   INSERTION OF DIALYSIS CATHETER     INSERTION OF DIALYSIS CATHETER  01/14/2016   Procedure: INSERTION OF DIALYSIS CATHETER;  Surgeon: Waynetta Sandy, MD;  Location: [place];  Service: Vascular;;   INSERTION OF DIALYSIS CATHETER Right 08/07/2017   Procedure: INSERTION OF DIALYSIS CATHETER;  Surgeon: Waynetta Sandy, MD;  Location: [place];  Service: Vascular;  Laterality: Right;   INSERTION OF DIALYSIS CATHETER Right 12/21/2017   Procedure: RIGHT FEMORAL DIALYSIS CATHETER EXCHANGE;  Surgeon: Marty Heck, MD;  Location: [place];  Service: Vascular;  Laterality: Right;   INSERTION OF ILIAC STENT Left 01/14/2016   Procedure: INSERTION OF ILIAC STENT;  Surgeon: Waynetta Sandy, MD;  Location: [place];  Service: Vascular;  Laterality: Left;   INTRAOPERATIVE ARTERIOGRAM Left 01/14/2016   Procedure: INTRA OPERATIVE ARTERIOGRAM;  Surgeon: Waynetta Sandy, MD;  Location: [place];  Service: Vascular;  Laterality: Left;   LITHOTRIPSY  x 3   "& a laser treatment" (02/29/2016)   NEPHROSTOMY  Bilateral 1968   PATELLAR TENDON REPAIR Right 1990s   "big incision"   PERIPHERAL VASCULAR CATHETERIZATION  01/14/2016   Procedure: A/V SHUNTOGRAM;  Surgeon: Waynetta Sandy, MD;  Location: [place];  Service: Vascular;;   REVISION OF ARTERIOVENOUS GORETEX GRAFT Left 10/16/2012   Procedure: REVISION OF LEFT FEMORAL LOOP ARTERIOVENOUS GORETEX GRAFT;  Surgeon: Rosetta Posner, MD;  Location: [place];  Service: Vascular;  Laterality: Left;   REVISION OF ARTERIOVENOUS GORETEX GRAFT Left 02/11/2015   Procedure: EXCISION OF SMALL SEGMENT OF EXPOSED LEFT THIGH NON FUNCTIONING  ARTERIOVENOUS GORETEX GRAFT;  Surgeon: Mal Misty, MD;  [facility];  Service: Vascular;  Laterality: Left;   REVISION OF ARTERIOVENOUS GORETEX GRAFT Left 01/14/2016   Procedure: REVISION OF ARTERIOVENOUS GORETEX GRAFT;  Surgeon: Waynetta Sandy, MD;  Location: [place];  Service: Vascular;  Laterality: Left;   REVISION OF ARTERIOVENOUS GORETEX GRAFT Left 02/29/2016   thigh/pt report   REVISION OF ARTERIOVENOUS GORETEX GRAFT Left 02/29/2016   Procedure: POSSIBLE REVISION OF LEFT THIGH ARTERIOVENOUS GORETEX GRAFT;  Surgeon: Waynetta Sandy, MD;  Location: [place];  Service: Vascular;  Laterality: Left;   TEE WITHOUT CARDIOVERSION N/A 12/14/2017   Procedure: TRANSESOPHAGEAL ECHOCARDIOGRAM (TEE);  Surgeon: Acie Fredrickson, Wonda Cheng, MD;  Location: [place];  Service: Cardiovascular;  Laterality: N/A;   THROMBECTOMY AND REVISION OF ARTERIOVENTOUS (AV) GORETEX  GRAFT Left 06/12/2018   Procedure: Removal of infected left thigh graft;  Surgeon: Rosetta Posner, MD;  Location: [place];  Service: Vascular;  Laterality: Left;   THROMBECTOMY W/ EMBOLECTOMY Left 01/14/2016   Procedure: THROMBECTOMY ARTERIOVENOUS GORE-TEX Left thigh GRAFT;  Surgeon: Waynetta Sandy, MD;  Location: [place];  Service: Vascular;  Laterality: Left;   TONGUE SURGERY  ~ 1990   tongue-tie release    ULTRASOUND GUIDANCE FOR VASCULAR ACCESS  Right 08/07/2017   Procedure: ULTRASOUND GUIDANCE FOR VASCULAR CANNULATION RIGHT FEMORAL VEIN AND LEFT AV FEMORAL GRAFT;  Surgeon: Waynetta Sandy, MD;  Location: [place];  Service: Vascular;  Laterality: Right;   UPPER EXTREMITY VENOGRAPHY Bilateral 08/09/2017   Procedure: UPPER EXTREMITY VENOGRAPHY;  Surgeon: Serafina Mitchell, MD;  Location: [place] [facility];  Service: Cardiovascular;  Laterality: Bilateral;   VENOGRAM N/A 09/20/2017   Procedure: RIGHT COMMON FEMORAL ARTERY EXPLORATION.  CANNULATION RIGHT COMMON FEMORAL VEIN. VENOGRAM CENTRAL ULTRA SOUND GUIDED RIGHT FEMORAL VEIN TIMES TWO.;  Surgeon: Waynetta Sandy, MD;  Location: [facility];  Service: Vascular;  Laterality: N/A;   WOUND DEBRIDEMENT Left 02/29/2016   thigh      Social History:      Social History   Tobacco Use   Smoking status: Never Smoker   Smokeless tobacco: Never Used  Substance Use Topics   Alcohol use: Not Currently    Alcohol/week: 0.0 standard drinks    Comment: 02/29/2016 "nothing since  the mid 1990s       Family History :     Family History  Problem Relation Age of Onset   Diabetes Mother    Hypertension Mother    Heart disease Mother        before age 16   Diabetes Father    Heart attack Father        X's 3   Diabetes Sister    Bipolar disorder Sister        Home Medications:   Prior to Admission medications   Medication Sig Start Date End Date Taking? Authorizing Provider  aspirin 325 MG EC tablet Take 325 mg by mouth daily.   Yes [provider]  calcium acetate (PHOSLO) 667 MG capsule Take 3-4 capsules (2,001-2,668 mg total) by mouth See admin instructions. Take 2708 mg by mouth 3 times daily with meals, take 2001 mg by mouth with snacks Patient taking differently: Take 2,001-2,668 mg by mouth See admin instructions. Take 2,668 mg by mouth three times a day with meals and 2,001 mg with optional snacks 01/14/18  Yes Hennie Duos, MD  clopidogrel  (PLAVIX) 75 MG tablet Take 1 tablet (75 mg total) by mouth daily. Patient taking differently: Take 75 mg by mouth  at bedtime.  01/14/18  Yes Hennie Duos, MD  famotidine (PEPCID) 40 MG tablet Take 1 tablet (40 mg total) by mouth daily at 2 PM. 01/14/18  Yes Hennie Duos, MD  fluticasone [facility]) 50 MCG/ACT nasal spray Place 2 sprays into both nostrils as needed for allergies or rhinitis (congestion). Patient taking differently: Place 2 sprays into both nostrils as needed for allergies or rhinitis (or congestion).  01/14/18  Yes Hennie Duos, MD  levothyroxine (SYNTHROID, LEVOTHROID) 125 MCG tablet Take 1 tablet (125 mcg total) by mouth daily before breakfast. 01/14/18  Yes Hennie Duos, MD  LORazepam (ATIVAN) 1 MG tablet Take 1 mg by mouth daily.   Yes [provider]  Nutritional Supplements (NEPRO PO) Take 1 Can by mouth 2 (two) times daily.    Yes [provider]  oxyCODONE-acetaminophen (PERCOCET) 10-325 MG tablet Take 1 tablet by mouth every 6 (six) hours as needed for pain. 06/14/18  Yes Dagoberto Ligas, PA-C  promethazine (PHENERGAN) 25 MG tablet Take 1 tablet (25 mg total) by mouth 2 (two) times daily as needed for nausea or vomiting. 01/14/18  Yes Hennie Duos, MD  sodium polystyrene (KAYEXALATE) powder Take 15 g by mouth See admin instructions. Take 15 g (mixed in water) by mouth on Saturdays and Sundays 01/14/18  Yes Hennie Duos, MD  traZODone (DESYREL) 100 MG tablet Take 1 tablet (100 mg total) by mouth at bedtime. 01/14/18  Yes Hennie Duos, MD  zolpidem (AMBIEN) 10 MG tablet Take 1 tablet (10 mg total) by mouth at bedtime. 01/14/18  Yes Hennie Duos, MD  methocarbamol (ROBAXIN) 500 MG tablet Take 1 tablet (500 mg total) by mouth every 6 (six) hours as needed for muscle spasms. Patient not taking: Reported on 11/26/2018 01/14/18   Hennie Duos, MD  midodrine (PROAMATINE) 5 MG tablet Take 1 tablet (5 mg total) by mouth 3 (three) times  daily with meals. Patient not taking: Reported on 11/26/2018 01/14/18   Hennie Duos, MD  Polyethylene Glycol 3350 POWD Take 17 g by mouth daily as needed. [place] 1 CAPFUL (17GM) IN 4-8OZ. OF WATER AND DRINK BY MOUTH DAILY AS NEEDED FOR MILD CONSTIPATION Patient not taking: Reported on 11/26/2018 01/14/18   Hennie Duos, MD  sodium zirconium cyclosilicate (LOKELMA) 10 g PACK packet Take 10 g by mouth daily. TAKE 10GM PACK ON OFF DIALYSIS DAYS ON TUESDAY ON THURSDAY ON SUNDAY Patient not taking: Reported on 11/26/2018 01/14/18   Hennie Duos, MD     Allergies:     Allergies  Allergen Reactions   Hydrocodone Other (See Comments)    Caused involuntary movement and twitching. CANNOT TAKE DUE TO MUSCLE SPASMS AND MUSCLE TREMORS   Furadantin [Nitrofurantoin] Other (See Comments)    UNSPECIFIED REACTION    Mandelamine [Methenamine] Other (See Comments)    UNSPECIFIED REACTION    Noroxin [Norfloxacin] Other (See Comments)    UNSPECIFIED REACTION    Carmine Nausea Only   Contrast Media [Iodinated Diagnostic Agents] Nausea And Vomiting    Oral dye causes vomiting, IV dye is okay   Metrizamide Nausea And Vomiting    Oral dye causes vomiting, IV dye is okay   Sulfa Antibiotics Other (See Comments) and Cough    Childhood reaction - pt could not confirm that it was a cough   Sulfur Cough    Childhood reaction - pt could not confirm that it was a cough     Physical Exam:  Vitals  Blood pressure 127/72, pulse 88, temperature (!) 97.5 F (36.4 C), temperature source Oral, resp. rate 17, SpO2 100 %.  1.  General: axoxo3  2. Psychiatric: euthymic  3. Neurologic: nonfocal  4. HEENMT:  Anicteric, pupils 1.65mm symmetric, direct, consensual Neck: no jvd  5. Respiratory : CTAB  6. Cardiovascular : rrr s1, s2,   7. Gastrointestinal:  Abd: soft, nt, nd, +bs R nephrostomy scar, slight dried blood  8. Skin:  Ext: no c/c/e  9.Musculoskeletal:  Good ROM     Data Review:    CBC Recent Labs  Lab 11/26/18 0138  WBC 12.1*  HGB 10.6*  HCT 33.6*  PLT 190  MCV 97.7  MCH 30.8  MCHC 31.5  RDW 13.9  LYMPHSABS 0.2*  MONOABS 0.5  EOSABS 0.0  BASOSABS 0.0   ------------------------------------------------------------------------------------------------------------------  Results for orders placed or performed during the hospital encounter of 11/25/18 (from the past 48 hour(s))  CBC with Differential     Status: Abnormal   Collection Time: 11/26/18  1:38 AM  Result Value Ref Range   WBC 12.1 (H) 4.0 - 10.5 K/uL   RBC 3.44 (L) 4.22 - 5.81 MIL/uL   Hemoglobin 10.6 (L) 13.0 - 17.0 g/dL   HCT 33.6 (L) 39.0 - 52.0 %   MCV 97.7 80.0 - 100.0 fL   MCH 30.8 26.0 - 34.0 pg   MCHC 31.5 30.0 - 36.0 g/dL   RDW 13.9 11.5 - 15.5 %   Platelets 190 150 - 400 K/uL   nRBC 0.0 0.0 - 0.2 %   Neutrophils Relative % 92 %   Neutro Abs 11.1 (H) 1.7 - 7.7 K/uL   Lymphocytes Relative 2 %   Lymphs Abs 0.2 (L) 0.7 - 4.0 K/uL   Monocytes Relative 4 %   Monocytes Absolute 0.5 0.1 - 1.0 K/uL   Eosinophils Relative 0 %   Eosinophils Absolute 0.0 0.0 - 0.5 K/uL   Basophils Relative 0 %   Basophils Absolute 0.0 0.0 - 0.1 K/uL   Immature Granulocytes 2 %   Abs Immature Granulocytes 0.22 (H) 0.00 - 0.07 K/uL    Comment: Performed at [facility], [address], [place], [place] [address]  Comprehensive metabolic panel     Status: Abnormal   Collection Time: 11/26/18  1:38 AM  Result Value Ref Range   Sodium 135 135 - 145 mmol/L   Potassium 4.3 3.5 - 5.1 mmol/L   Chloride 94 (L) 98 - 111 mmol/L   CO2 22 22 - 32 mmol/L   Glucose, Bld 129 (H) 70 - 99 mg/dL   BUN 48 (H) 6 - 20 mg/dL   Creatinine, Ser 10.46 (H) 0.61 - 1.24 mg/dL   Calcium 9.1 8.9 - 10.3 mg/dL   Total Protein 9.1 (H) 6.5 - 8.1 g/dL   Albumin 3.2 (L) 3.5 - 5.0 g/dL   AST 15 15 - 41 U/L   ALT 13 0 - 44 U/L   Alkaline Phosphatase 84 38 - 126 U/L   Total Bilirubin 1.1 0.3 - 1.2 mg/dL   GFR  calc non Af Amer 5 (L) >60 mL/min   GFR calc Af Amer 6 (L) >60 mL/min   Anion gap 19 (H) 5 - 15    Comment: Performed at [facility], [place] [address], [place], [place] [address]  Lipase, blood     Status: None   Collection Time: 11/26/18  1:38 AM  Result Value Ref Range   Lipase 23  11 - 51 U/L    Comment: Performed at [facility], [place] [address], [place], [place] [address]    Chemistries  Recent Labs  Lab 11/26/18 0138  NA 135  K 4.3  CL 94*  CO2 22  GLUCOSE 129*  BUN 48*  CREATININE 10.46*  CALCIUM 9.1  AST 15  ALT 13  ALKPHOS 84  BILITOT 1.1   ------------------------------------------------------------------------------------------------------------------  ------------------------------------------------------------------------------------------------------------------ GFR: CrCl cannot be calculated (Unknown ideal weight.). Liver Function Tests: Recent Labs  Lab 11/26/18 0138  AST 15  ALT 13  ALKPHOS 84  BILITOT 1.1  PROT 9.1*  ALBUMIN 3.2*   Recent Labs  Lab 11/26/18 0138  LIPASE 23   No results for input(s): AMMONIA in the last 168 hours. Coagulation Profile: No results for input(s): INR, PROTIME in the last 168 hours. Cardiac Enzymes: No results for input(s): CKTOTAL, CKMB, CKMBINDEX, TROPONINI in the last 168 hours. BNP (last 3 results) No results for input(s): PROBNP in the last 8760 hours. HbA1C: No results for input(s): HGBA1C in the last 72 hours. CBG: No results for input(s): GLUCAP in the last 168 hours. Lipid Profile: No results for input(s): CHOL, HDL, LDLCALC, TRIG, CHOLHDL, LDLDIRECT in the last 72 hours. Thyroid Function Tests: No results for input(s): TSH, T4TOTAL, FREET4, T3FREE, THYROIDAB in the last 72 hours. Anemia Panel: No results for input(s): VITAMINB12, FOLATE, FERRITIN, TIBC, IRON, RETICCTPCT in the last 72  hours.  --------------------------------------------------------------------------------------------------------------- Urine analysis:    Component Value Date/Time   COLORURINE RED (A) 07/31/2010 0410   APPEARANCEUR TURBID (A) 07/31/2010 0410   LABSPEC 1.044 (H) 07/31/2010 0410   PHURINE 6.5 07/31/2010 0410   GLUCOSEU 100 (A) 07/31/2010 0410   HGBUR LARGE (A) 07/31/2010 0410   BILIRUBINUR LARGE (A) 07/31/2010 0410   KETONESUR 40 (A) 07/31/2010 0410   PROTEINUR >300 (A) 07/31/2010 0410   UROBILINOGEN 4.0 (H) 07/31/2010 0410   NITRITE POSITIVE (A) 07/31/2010 0410   LEUKOCYTESUR LARGE (A) 07/31/2010 0410      Imaging Results:    Ct Renal Stone Study  Result Date: 11/26/2018 CLINICAL DATA:  Flank pain, recurrent stone disease suspected EXAM: CT ABDOMEN AND PELVIS WITHOUT CONTRAST TECHNIQUE: Multidetector CT imaging of the abdomen and pelvis was performed following the standard protocol without IV contrast. COMPARISON:  CT abdomen pelvis 12/08/2017 FINDINGS: Lower chest: Bandlike areas of scarring and/or atelectasis. Coarse calcification of the mitral annulus. Extensive calcifications upon the aortic leaflets. Three-vessel coronary artery disease. Normal cardiac size. No pericardial effusion. Hepatobiliary: No focal liver lesions. Moderate distention of the gallbladder without visible calcified gallstones or biliary ductal dilatation. Pancreas: Unremarkable. No pancreatic ductal dilatation or surrounding inflammatory changes. Spleen: Splenomegaly. No focal splenic lesions. Adrenals/Urinary Tract: Normal adrenal glands. Bilateral renal atrophy. Asymmetric enlargement of the left kidney with moderate hydroureteronephrosis. Punctate 2 mm calcification seen in the proximal left ureter (3/50 and additional radiodensity in the distal left ureter (3/59) which takes a markedly medialized course almost crossing midline prior to proceeding inferiorly to the decompressed urinary bladder. There is  extensive left perinephric stranding and trace free fluid as well as periureteral stranding and urothelial thickening. Fluid attenuation cyst in the interpolar left kidney is noted. Additional fluid attenuation cysts seen in the lower pole right kidney. Additional nonobstructive bilateral nephrolithiasis is present Stomach/Bowel: Distal esophagus, stomach and duodenal sweep are unremarkable. No small bowel wall thickening or dilatation. Right lower quadrant ileostomy with peristomal herniation of several additional loops of nonobstructed small bowel. A normal appendix is visualized.  No colonic dilatation or wall thickening. High attenuation inspissated material noted within the colon. There is circumferential rectal wall thickening, nonspecific. Vascular/Lymphatic: Atherosclerotic plaque within the normal caliber aorta. Right iliac vein central venous catheter tip terminates at the mid IVC. Left iliac vein stent graft is noted. Luminal evaluation precluded on this non contrasted exam. 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 Venous collateralization the low anterior abdomen and upper abdomen as well. Reactive adenopathy in the retroperitoneum. No pathologically enlarged nodes. Reproductive: The prostate and seminal vesicles are unremarkable. Other: Circumferential body wall edema. Retroperitoneal stranding and fluid centered upon the left kidney and ureter. No free air. Small right fat containing inguinal hernia. Bowel containing peristomal hernia, as above. Postsurgical changes of the low anterior abdomen. Musculoskeletal: Muscular atrophy of the abdominal wall and pelvic girdle. Multilevel degenerative changes are present in the imaged portions of the spine. Endplate sclerosis compatible with renal osteodystrophy. Degenerative changes noted in the spine, hips and SI joints. IMPRESSION: 1. Asymmetric enlargement of the left kidney with moderate  hydroureteronephrosis and extensive left perinephric stranding and trace free fluid. Punctate 2 mm calcification seen in the proximal left ureter (3/50) and additional radiodensity in the distal left ureter (3/59) which takes a markedly medialized course almost crossing midline prior to proceeding inferiorly to the decompressed urinary bladder. Free fluid about the kidney and proximal ureter may reflect a calyceal rupture. 2. Additional nonobstructive bilateral nephrolithiasis. 3. Bilateral renal atrophy and cortical thinning. 4. Right lower quadrant ileostomy with peristomal herniation of several additional loops of nonobstructed small bowel. 5. Circumferential rectal wall thickening, nonspecific, but could be seen with proctitis though should be correlated with direct visualization to exclude underlying malignancy. 6. Circumferential body wall edema. 7. Sclerotic changes of the axial skeleton compatible with renal osteodystrophy. 8. Vascular stenting and bypass grafting, as detailed above. Right lower extremity venous catheter terminates in the IVC. 9. Distention of the gallbladder without visible cholelithiasis or choledocholithiasis. Correlate with abdominal symptoms. If warranted, right upper quadrant ultrasound could be obtained. 10. Aortic Atherosclerosis (ICD10-I70.0). Electronically Signed   By: Lovena Le M.D.   On: 11/26/2018 01:53       Assessment & Plan:    Principal Problem:   Flank pain Active Problems:   ESRD (end stage renal disease) on dialysis ([place])   Nephrolithiasis   Hydronephrosis with renal and ureteral calculus obstruction  L flank pain ? Pyelonephritis Rocephin 1gm iv qday Percocet 1 po q6h prn  Dilaudid 0.5mg  iv q4h prn severe pain   L hydronephrosis w ? Calyceal rupture Urology consulted by ED, appreciate input  ESRD on HD Cont Phoslo Cont Kayexalate Please consult nephrology in AM  Hypertension, PVD Cont aspirin Cont Plavix 75mg  po  qday  Hypothyroidism Cont Levothyroxine 158micrograms po qday  Anxiety Cont lorazepam prn   Gerd Cont Pepcid  Insomnia Cont Trazodone 100mg  po qhs     DVT Prophylaxis-   Lovenox - SCDs   AM Labs Ordered, also please review Full Orders  Family Communication: Admission, patients condition and plan of care including tests being ordered have been discussed with the patient  who indicate understanding and agree with the plan and Code Status.  Code Status:  FULL CODE per patient  Admission status: Observation: Based on patients clinical presentation and evaluation of above clinical data, I have made determination that patient meets observation criteria at this time.   Time spent in minutes : 55 minutes   Jani Gravel  M.D on 11/26/2018 at 5:18 AM

## 2018-11-26 NOTE — ED Notes (Signed)
IR PA stated patient will not have procedure and able to eat and drink.

## 2018-11-26 NOTE — ED Notes (Signed)
ED TO INPATIENT HANDOFF REPORT  ED Nurse Name and Phone #: Marya Amsler RN [phone]  S Name/Age/Gender [name] 53 y.o. male Room/Bed: 038C/038C  Code Status   Code Status: Full Code  Home/SNF/Other Home Patient oriented to: self, place, time and situation Is this baseline? Yes   Triage Complete: Triage complete  Chief Complaint Kidney Stone  Triage Note Pt arrives via EMS from home with chronic kidney stones. Pain for about 2 weeks that worsened today and nausea. HD pt 22G L hand, EMS gave 250 fentanyy and 4 mg zofran   Allergies Allergies  Allergen Reactions  . Hydrocodone Other (See Comments)    Caused involuntary movement and twitching. CANNOT TAKE DUE TO MUSCLE SPASMS AND MUSCLE TREMORS  . Furadantin [Nitrofurantoin] Other (See Comments)    UNSPECIFIED REACTION   . Mandelamine [Methenamine] Other (See Comments)    UNSPECIFIED REACTION   . Noroxin [Norfloxacin] Other (See Comments)    UNSPECIFIED REACTION   . Carmine Nausea Only  . Contrast Media [Iodinated Diagnostic Agents] Nausea And Vomiting    Oral dye causes vomiting, IV dye is okay  . Metrizamide Nausea And Vomiting    Oral dye causes vomiting, IV dye is okay  . Sulfa Antibiotics Other (See Comments) and Cough    Childhood reaction - pt could not confirm that it was a cough  . Sulfur Cough    Childhood reaction - pt could not confirm that it was a cough    Level of Care/Admitting Diagnosis ED Disposition    ED Disposition Condition [facility] Area: [place] [[id number]]  Level of Care: Med-Surg [16]  Covid Evaluation: Asymptomatic Screening Protocol (No Symptoms)  Diagnosis: Left flank pain [id number]  Admitting Physician: Barton Dubois [[id number]]  Attending Physician: Barton Dubois [[id number]]  Estimated length of stay: past midnight tomorrow  Certification:: I certify this patient will need inpatient services for at least 2 midnights  PT Class (Do Not Modify): Inpatient  [101]  PT Acc Code (Do Not Modify): Private [1]       B Medical/Surgery History Past Medical History:  Diagnosis Date  . Anemia   . Anxiety   . Constipation   . End stage renal failure on dialysis [facility])    [place]; MWF, on HD since 1997, exhausted upper ext access, and possibly LE access; cath dependent in R groin as of 06/2018  . GERD (gastroesophageal reflux disease)   . Grand mal seizure ([place]) X 4   last 1998 (02/29/2016)  . Heart murmur    no problems with per pt. - nephrologist and Dr. Coletta Memos  . History of blood transfusion 2000s   "I was having blood loss; never found out from where"  . History of kidney stones   . Hypertension    hx of - has not taken bp meds in over 2 years  . Hypothyroidism   . Insomnia   . Migraine    "due to my BP in my late 1990s; none since" (02/29/2016)  . Nonhealing surgical wound    of the left arteriovenous graft  . Pneumonia    x 2  . Shortness of breath    rarely  . Spina bifida [facility])    Past Surgical History:  Procedure Laterality Date  . AMPUTATION Left 12/11/2017   Procedure: Left Great Toe Amputation;  Surgeon: [name], MD;  Location: [facility] [place];  Service: Orthopedics;  Laterality: Left;  . APPENDECTOMY    . ARTERIOVENOUS  GRAFT PLACEMENT Left 10/16/2012   left femoral goretex graft         Dr Donnetta Hutching  . AV FISTULA PLACEMENT Left 06/27/2012   Procedure: EXPLORATORY LEFT THY-GRAFT PSEUDO-ANEURYSM;  Surgeon: Conrad Arnold, MD;  Location: [place];  Service: Vascular;  Laterality: Left;  Revision of left Arteriovenus gortex graft in thigh.  . COLONOSCOPY    . I&D EXTREMITY Left 02/29/2016   Procedure: DEBRIDEMENT LEFT THIGH WOUND;  Surgeon: Waynetta Sandy, MD;  Location: [place];  Service: Vascular;  Laterality: Left;  . ILEOSTOMY  1970s  . INCISION AND DRAINAGE Left 10/16/2012   Procedure: INCISION AND Debridement left thigh graft;  Surgeon: Rosetta Posner, MD;  Location: [place];  Service: Vascular;  Laterality: Left;  .  INSERTION OF DIALYSIS CATHETER    . INSERTION OF DIALYSIS CATHETER  01/14/2016   Procedure: INSERTION OF DIALYSIS CATHETER;  Surgeon: Waynetta Sandy, MD;  Location: [place];  Service: Vascular;;  . INSERTION OF DIALYSIS CATHETER Right 08/07/2017   Procedure: INSERTION OF DIALYSIS CATHETER;  Surgeon: Waynetta Sandy, MD;  Location: [place];  Service: Vascular;  Laterality: Right;  . INSERTION OF DIALYSIS CATHETER Right 12/21/2017   Procedure: RIGHT FEMORAL DIALYSIS CATHETER EXCHANGE;  Surgeon: Marty Heck, MD;  Location: [place];  Service: Vascular;  Laterality: Right;  . INSERTION OF ILIAC STENT Left 01/14/2016   Procedure: INSERTION OF ILIAC STENT;  Surgeon: Waynetta Sandy, MD;  Location: [place];  Service: Vascular;  Laterality: Left;  . INTRAOPERATIVE ARTERIOGRAM Left 01/14/2016   Procedure: INTRA OPERATIVE ARTERIOGRAM;  Surgeon: Waynetta Sandy, MD;  Location: [place];  Service: Vascular;  Laterality: Left;  . LITHOTRIPSY  x 3   "& a laser treatment" (02/29/2016)  . NEPHROSTOMY Bilateral 1968  . PATELLAR TENDON REPAIR Right 1990s   "big incision"  . PERIPHERAL VASCULAR CATHETERIZATION  01/14/2016   Procedure: A/V SHUNTOGRAM;  Surgeon: Waynetta Sandy, MD;  Location: [place];  Service: Vascular;;  . REVISION OF ARTERIOVENOUS GORETEX GRAFT Left 10/16/2012   Procedure: REVISION OF LEFT FEMORAL LOOP ARTERIOVENOUS GORETEX GRAFT;  Surgeon: Rosetta Posner, MD;  Location: [place];  Service: Vascular;  Laterality: Left;  . REVISION OF ARTERIOVENOUS GORETEX GRAFT Left 02/11/2015   Procedure: EXCISION OF SMALL SEGMENT OF EXPOSED LEFT THIGH NON FUNCTIONING  ARTERIOVENOUS GORETEX GRAFT;  Surgeon: Mal Misty, MD;  Location: [place];  Service: Vascular;  Laterality: Left;  . REVISION OF ARTERIOVENOUS GORETEX GRAFT Left 01/14/2016   Procedure: REVISION OF ARTERIOVENOUS GORETEX GRAFT;  Surgeon: Waynetta Sandy, MD;  Location: [place];  Service: Vascular;  Laterality:  Left;  . REVISION OF ARTERIOVENOUS GORETEX GRAFT Left 02/29/2016   thigh/pt report  . REVISION OF ARTERIOVENOUS GORETEX GRAFT Left 02/29/2016   Procedure: POSSIBLE REVISION OF LEFT THIGH ARTERIOVENOUS GORETEX GRAFT;  Surgeon: Waynetta Sandy, MD;  Location: [place];  Service: Vascular;  Laterality: Left;  . TEE WITHOUT CARDIOVERSION N/A 12/14/2017   Procedure: TRANSESOPHAGEAL ECHOCARDIOGRAM (TEE);  Surgeon: Acie Fredrickson, Wonda Cheng, MD;  Location: [place];  Service: Cardiovascular;  Laterality: N/A;  . THROMBECTOMY AND REVISION OF ARTERIOVENTOUS (AV) GORETEX  GRAFT Left 06/12/2018   Procedure: Removal of infected left thigh graft;  Surgeon: Rosetta Posner, MD;  Location: [place];  Service: Vascular;  Laterality: Left;  . THROMBECTOMY W/ EMBOLECTOMY Left 01/14/2016   Procedure: THROMBECTOMY ARTERIOVENOUS GORE-TEX Left thigh GRAFT;  Surgeon: Waynetta Sandy, MD;  Location: [place];  Service: Vascular;  Laterality: Left;  . TONGUE SURGERY  ~ 1990   tongue-tie release   . ULTRASOUND GUIDANCE FOR VASCULAR ACCESS Right 08/07/2017   Procedure: ULTRASOUND GUIDANCE FOR VASCULAR CANNULATION RIGHT FEMORAL VEIN AND LEFT AV FEMORAL GRAFT;  Surgeon: Waynetta Sandy, MD;  Location: [place];  Service: Vascular;  Laterality: Right;  . UPPER EXTREMITY VENOGRAPHY Bilateral 08/09/2017   Procedure: UPPER EXTREMITY VENOGRAPHY;  Surgeon: Serafina Mitchell, MD;  Location: [place] [facility];  Service: Cardiovascular;  Laterality: Bilateral;  . VENOGRAM N/A 09/20/2017   Procedure: RIGHT COMMON FEMORAL ARTERY EXPLORATION.  CANNULATION RIGHT COMMON FEMORAL VEIN. VENOGRAM CENTRAL ULTRA SOUND GUIDED RIGHT FEMORAL VEIN TIMES TWO.;  Surgeon: Waynetta Sandy, MD;  Location: [place];  Service: Vascular;  Laterality: N/A;  . WOUND DEBRIDEMENT Left 02/29/2016   thigh     A IV Location/Drains/Wounds Patient Lines/Drains/Airways Status   Active Line/Drains/Airways    Name:   Placement date:   Placement time:    Site:   Days:   Peripheral IV 11/26/18 Left Hand   11/26/18    0047    Hand   less than 1   Fistula / Graft Left Thigh Arteriovenous vein graft   06/12/18    -    Thigh   167   Hemodialysis Catheter Right Femoral vein Double-lumen;Temporary   12/21/17    1936    Femoral vein   340   Ileostomy RLQ   -    -    RLQ      Incision (Closed) 12/21/17 Groin Right   12/21/17    1849     340   Incision (Closed) 06/12/18 Thigh Left   06/12/18    1828     167   Wound / Incision (Open or Dehisced) 12/08/17 Non-pressure wound Foot Left left great toe necrosis with purple surrounding skin and left foot calus noted   12/08/17    0010    Foot   353   Wound / Incision (Open or Dehisced) 12/08/17 Other (Comment) Back Right small pin hole open to right back with scant amount of drainage noted   12/08/17    0010    Back   353          Intake/Output Last 24 hours No intake or output data in the 24 hours ending 11/26/18 1235  Labs/Imaging Results for orders placed or performed during the hospital encounter of 11/25/18 (from the past 48 hour(s))  CBC with Differential     Status: Abnormal   Collection Time: 11/26/18  1:38 AM  Result Value Ref Range   WBC 12.1 (H) 4.0 - 10.5 K/uL   RBC 3.44 (L) 4.22 - 5.81 MIL/uL   Hemoglobin 10.6 (L) 13.0 - 17.0 g/dL   HCT 33.6 (L) 39.0 - 52.0 %   MCV 97.7 80.0 - 100.0 fL   MCH 30.8 26.0 - 34.0 pg   MCHC 31.5 30.0 - 36.0 g/dL   RDW 13.9 11.5 - 15.5 %   Platelets 190 150 - 400 K/uL   nRBC 0.0 0.0 - 0.2 %   Neutrophils Relative % 92 %   Neutro Abs 11.1 (H) 1.7 - 7.7 K/uL   Lymphocytes Relative 2 %   Lymphs Abs 0.2 (L) 0.7 - 4.0 K/uL   Monocytes Relative 4 %   Monocytes Absolute 0.5 0.1 - 1.0 K/uL   Eosinophils Relative 0 %   Eosinophils Absolute 0.0 0.0 - 0.5 K/uL   Basophils Relative 0 %  Basophils Absolute 0.0 0.0 - 0.1 K/uL   Immature Granulocytes 2 %   Abs Immature Granulocytes 0.22 (H) 0.00 - 0.07 K/uL    Comment: Performed at [facility], [place]  [address] [place], [place] [address]  Comprehensive metabolic panel     Status: Abnormal   Collection Time: 11/26/18  1:38 AM  Result Value Ref Range   Sodium 135 135 - 145 mmol/L   Potassium 4.3 3.5 - 5.1 mmol/L   Chloride 94 (L) 98 - 111 mmol/L   CO2 22 22 - 32 mmol/L   Glucose, Bld 129 (H) 70 - 99 mg/dL   BUN 48 (H) 6 - 20 mg/dL   Creatinine, Ser 10.46 (H) 0.61 - 1.24 mg/dL   Calcium 9.1 8.9 - 10.3 mg/dL   Total Protein 9.1 (H) 6.5 - 8.1 g/dL   Albumin 3.2 (L) 3.5 - 5.0 g/dL   AST 15 15 - 41 U/L   ALT 13 0 - 44 U/L   Alkaline Phosphatase 84 38 - 126 U/L   Total Bilirubin 1.1 0.3 - 1.2 mg/dL   GFR calc non Af Amer 5 (L) >60 mL/min   GFR calc Af Amer 6 (L) >60 mL/min   Anion gap 19 (H) 5 - 15    Comment: Performed at [facility], [place] [address], [place], [place] [address]  Lipase, blood     Status: None   Collection Time: 11/26/18  1:38 AM  Result Value Ref Range   Lipase 23 11 - 51 U/L    Comment: Performed at [facility] [address] [place], [place] [address]  Creatinine, serum     Status: Abnormal   Collection Time: 11/26/18  6:00 AM  Result Value Ref Range   Creatinine, Ser 10.63 (H) 0.61 - 1.24 mg/dL   GFR calc non Af Amer 5 (L) >60 mL/min   GFR calc Af Amer 6 (L) >60 mL/min    Comment: Performed at [facility] [address] [place], [place] [address]  CBC     Status: Abnormal   Collection Time: 11/26/18  6:00 AM  Result Value Ref Range   WBC 13.5 (H) 4.0 - 10.5 K/uL   RBC 3.43 (L) 4.22 - 5.81 MIL/uL   Hemoglobin 10.4 (L) 13.0 - 17.0 g/dL   HCT 33.4 (L) 39.0 - 52.0 %   MCV 97.4 80.0 - 100.0 fL   MCH 30.3 26.0 - 34.0 pg   MCHC 31.1 30.0 - 36.0 g/dL   RDW 14.1 11.5 - 15.5 %   Platelets 213 150 - 400 K/uL   nRBC 0.0 0.0 - 0.2 %    Comment: Performed at [facility], [place] [address] [place], [place] [address]  Protime-INR     Status: None   Collection Time: 11/26/18  8:15 AM  Result Value Ref Range   Prothrombin Time 14.7 11.4 - 15.2  seconds   INR 1.2 0.8 - 1.2    Comment: (NOTE) INR goal varies based on device and disease states. Performed at [facility], [place] [address] [place], [place] [address]    Ct Renal Stone Study  Result Date: 11/26/2018 CLINICAL DATA:  Flank pain, recurrent stone disease suspected EXAM: CT ABDOMEN AND PELVIS WITHOUT CONTRAST TECHNIQUE: Multidetector CT imaging of the abdomen and pelvis was performed following the standard protocol without IV contrast. COMPARISON:  CT abdomen pelvis 12/08/2017 FINDINGS: Lower chest: Bandlike areas of scarring and/or atelectasis. Coarse  calcification of the mitral annulus. Extensive calcifications upon the aortic leaflets. Three-vessel coronary artery disease. Normal cardiac size. No pericardial effusion. Hepatobiliary: No focal liver lesions. Moderate distention of the gallbladder without visible calcified gallstones or biliary ductal dilatation. Pancreas: Unremarkable. No pancreatic ductal dilatation or surrounding inflammatory changes. Spleen: Splenomegaly. No focal splenic lesions. Adrenals/Urinary Tract: Normal adrenal glands. Bilateral renal atrophy. Asymmetric enlargement of the left kidney with moderate hydroureteronephrosis. Punctate 2 mm calcification seen in the proximal left ureter (3/50 and additional radiodensity in the distal left ureter (3/59) which takes a markedly medialized course almost crossing midline prior to proceeding inferiorly to the decompressed urinary bladder. There is extensive left perinephric stranding and trace free fluid as well as periureteral stranding and urothelial thickening. Fluid attenuation cyst in the interpolar left kidney is noted. Additional fluid attenuation cysts seen in the lower pole right kidney. Additional nonobstructive bilateral nephrolithiasis is present Stomach/Bowel: Distal esophagus, stomach and duodenal sweep are unremarkable. No small bowel wall thickening or dilatation. Right lower quadrant ileostomy with  peristomal herniation of several additional loops of nonobstructed small bowel. A normal appendix is visualized. No colonic dilatation or wall thickening. High attenuation inspissated material noted within the colon. There is circumferential rectal wall thickening, nonspecific. Vascular/Lymphatic: Atherosclerotic plaque within the normal caliber aorta. Right iliac vein central venous catheter tip terminates at the mid IVC. Left iliac vein stent graft is noted. Luminal evaluation precluded on this non contrasted exam. 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 Venous collateralization the low anterior abdomen and upper abdomen as well. Reactive adenopathy in the retroperitoneum. No pathologically enlarged nodes. Reproductive: The prostate and seminal vesicles are unremarkable. Other: Circumferential body wall edema. Retroperitoneal stranding and fluid centered upon the left kidney and ureter. No free air. Small right fat containing inguinal hernia. Bowel containing peristomal hernia, as above. Postsurgical changes of the low anterior abdomen. Musculoskeletal: Muscular atrophy of the abdominal wall and pelvic girdle. Multilevel degenerative changes are present in the imaged portions of the spine. Endplate sclerosis compatible with renal osteodystrophy. Degenerative changes noted in the spine, hips and SI joints. IMPRESSION: 1. Asymmetric enlargement of the left kidney with moderate hydroureteronephrosis and extensive left perinephric stranding and trace free fluid. Punctate 2 mm calcification seen in the proximal left ureter (3/50) and additional radiodensity in the distal left ureter (3/59) which takes a markedly medialized course almost crossing midline prior to proceeding inferiorly to the decompressed urinary bladder. Free fluid about the kidney and proximal ureter may reflect a calyceal rupture. 2. Additional nonobstructive bilateral nephrolithiasis. 3.  Bilateral renal atrophy and cortical thinning. 4. Right lower quadrant ileostomy with peristomal herniation of several additional loops of nonobstructed small bowel. 5. Circumferential rectal wall thickening, nonspecific, but could be seen with proctitis though should be correlated with direct visualization to exclude underlying malignancy. 6. Circumferential body wall edema. 7. Sclerotic changes of the axial skeleton compatible with renal osteodystrophy. 8. Vascular stenting and bypass grafting, as detailed above. Right lower extremity venous catheter terminates in the IVC. 9. Distention of the gallbladder without visible cholelithiasis or choledocholithiasis. Correlate with abdominal symptoms. If warranted, right upper quadrant ultrasound could be obtained. 10. Aortic Atherosclerosis (ICD10-I70.0). Electronically Signed   By: Lovena Le M.D.   On: 11/26/2018 01:53    Pending Labs Unresulted Labs (From admission, onward)    Start     Ordered   11/27/18 0500  HIV Antibody (routine testing w rflx)  (HIV Antibody (Routine testing w  reflex) panel)  Tomorrow morning,   R     11/26/18 0452   11/27/18 0500  Comprehensive metabolic panel  Tomorrow morning,   R     11/26/18 0452   11/26/18 1152  SARS CORONAVIRUS 2 (TAT 6-24 HRS) Nasopharyngeal Nasopharyngeal Swab  (Asymptomatic/Tier 3)  Once,   STAT    Question Answer Comment  Is this test for diagnosis or screening Screening   Symptomatic for COVID-19 as defined by CDC No   Hospitalized for COVID-19 No   Admitted to ICU for COVID-19 No   Previously tested for COVID-19 Yes   Resident in a congregate (group) care setting No   Employed in healthcare setting No      11/26/18 1151          Vitals/Pain Today's Vitals   11/26/18 1104 11/26/18 1106 11/26/18 1208 11/26/18 1218  BP: 125/73   (!) 163/98  Pulse: 78   78  Resp: 18   16  Temp:      TempSrc:      SpO2: 96%   99%  PainSc:  9  10-Worst pain ever     Isolation Precautions No active  isolations  Medications Medications  heparin injection 5,000 Units (has no administration in time range)  acetaminophen (TYLENOL) tablet 650 mg (650 mg Oral Given 11/26/18 0920)    Or  acetaminophen (TYLENOL) suppository 650 mg ( Rectal See Alternative 11/26/18 0920)  aspirin EC tablet 325 mg (325 mg Oral Given 11/26/18 0920)  LORazepam (ATIVAN) tablet 1 mg (1 mg Oral Given 11/26/18 0920)  traZODone (DESYREL) tablet 100 mg (has no administration in time range)  levothyroxine (SYNTHROID) tablet 125 mcg (125 mcg Oral Given 11/26/18 0919)  calcium acetate (PHOSLO) capsule 2,668 mg (2,668 mg Oral Not Given 11/26/18 1215)  famotidine (PEPCID) tablet 40 mg (has no administration in time range)  clopidogrel (PLAVIX) tablet 75 mg (has no administration in time range)  sodium polystyrene (KAYEXALATE) powder 15 g (has no administration in time range)  fluticasone (FLONASE) 50 MCG/ACT nasal spray 2 spray (has no administration in time range)  HYDROmorphone (DILAUDID) injection 0.5 mg (0.5 mg Intravenous Given 11/26/18 1209)  ondansetron (ZOFRAN) injection 4 mg (has no administration in time range)  oxyCODONE-acetaminophen (PERCOCET/ROXICET) 5-325 MG per tablet 1 tablet (1 tablet Oral Given 11/26/18 0920)    And  oxyCODONE (Oxy IR/ROXICODONE) immediate release tablet 5 mg (has no administration in time range)  calcium acetate (PHOSLO) capsule 2,001 mg (has no administration in time range)  feeding supplement (NEPRO CARB STEADY) liquid 237 mL (237 mLs Oral Not Given 11/26/18 1102)  morphine 4 MG/ML injection 4 mg (4 mg Intravenous Given 11/26/18 0114)  ondansetron (ZOFRAN) injection 4 mg (4 mg Intravenous Given 11/26/18 0114)  HYDROmorphone (DILAUDID) injection 1 mg (1 mg Intravenous Given 11/26/18 0331)  HYDROmorphone (DILAUDID) injection 1 mg (1 mg Intravenous Given 11/26/18 0518)    Mobility walks with device Low fall risk   Focused Assessments Renal Assessment Handoff:  Hemodialysis  Schedule: Hemodialysis Schedule: Tuesday/Thursday/Saturday Last Hemodialysis date and time:    Restricted appendage: right legRight Leg       R Recommendations: See Admitting Provider Note  Report given to:   Additional Notes:

## 2018-11-26 NOTE — Progress Notes (Signed)
Pharmacy note: renal adjustment  Patient on pepcid 40mg  po daily (on this dose at home)   Plan -With ESRD, will adjust dose to 20mg  po daily -Consider decreasing pepcid to 20mg  po daily at discharge  Hildred Laser, PharmD Clinical Pharmacist **Pharmacist phone directory can now be found on [email or web address] ([email or web address]).  Listed under [place].

## 2018-11-26 NOTE — ED Notes (Addendum)
Walked pass door did not see pt in bed the patient on the floor on his knees ask him .was he ok .patient stated it makes him feel better.reported to nurses and charge.placed patient on monitor.and gotten vitalsigns.

## 2018-11-26 NOTE — Consult Note (Addendum)
[facility] Renal Consultation Note  Indication for Consultation:  Management of ESRD/hemodialysis; anemia, hypertension/volume and secondary hyperparathyroidism  HPI: [name] is a 53 y.o. male with hx of ESRD HD since 1997 (HD MWF compliant with HD )  , spinal bifida,history of a neurogenic bladder managed by ileal conduit (bladder still in place) seizures, migraine HA, AOCD, SHPT, Hyperkalemia, HTN-now Hypotension, obesity, kidney stones, GERD, anxiety. He is now admitted with Left Flank Pain , found have hydronephrosis and obstructing nephrolithiasis. Elevated WBC's and concerns for developing pyelonephritis.Urology consulted noted =  left ureteral calculi and moderate hydronephrosis with calyceal rupture . And plan is  -"Recommend left percutaneous nephrostomy tube placement by interventional radiology to decompress the left kidney and help with pain control -Patient to follow-up as an outpatient at [facility] urology for definitive stone management which would likely be antegrade ureteroscopy vs nephrectomy"  No recent chills, fevers  Cough , cp . Has decreased appetite and progressive flank pain   We are consulted for Hd / esrd  Issues  .He is noted to have HO almost End stage HD acces using Right femoral pc now      Past Medical History:  Diagnosis Date  . Anemia   . Anxiety   . Constipation   . End stage renal failure on dialysis [facility])    [place]; MWF, on HD since 1997, exhausted upper ext access, and possibly LE access; cath dependent in R groin as of 06/2018  . GERD (gastroesophageal reflux disease)   . Grand mal seizure ([place]) X 4   last 1998 (02/29/2016)  . Heart murmur    no problems with per pt. - nephrologist and Dr. Coletta Memos  . History of blood transfusion 2000s   "I was having blood loss; never found out from where"  . History of kidney stones   . Hypertension    hx of - has not taken bp meds in over 2 years  . Hypothyroidism   . Insomnia   .  Migraine    "due to my BP in my late 1990s; none since" (02/29/2016)  . Nonhealing surgical wound    of the left arteriovenous graft  . Pneumonia    x 2  . Shortness of breath    rarely  . Spina bifida [facility])     Past Surgical History:  Procedure Laterality Date  . AMPUTATION Left 12/11/2017   Procedure: Left Great Toe Amputation;  Surgeon: [name], MD;  Location: [place];  Service: Orthopedics;  Laterality: Left;  . APPENDECTOMY    . ARTERIOVENOUS GRAFT PLACEMENT Left 10/16/2012   left femoral goretex graft         Dr Donnetta Hutching  . AV FISTULA PLACEMENT Left 06/27/2012   Procedure: EXPLORATORY LEFT THY-GRAFT PSEUDO-ANEURYSM;  Surgeon: Conrad Caney, MD;  Location: [place];  Service: Vascular;  Laterality: Left;  Revision of left Arteriovenus gortex graft in thigh.  . COLONOSCOPY    . I&D EXTREMITY Left 02/29/2016   Procedure: DEBRIDEMENT LEFT THIGH WOUND;  Surgeon: Waynetta Sandy, MD;  Location: [place];  Service: Vascular;  Laterality: Left;  . ILEOSTOMY  1970s  . INCISION AND DRAINAGE Left 10/16/2012   Procedure: INCISION AND Debridement left thigh graft;  Surgeon: Rosetta Posner, MD;  Location: [place];  Service: Vascular;  Laterality: Left;  . INSERTION OF DIALYSIS CATHETER    . INSERTION OF DIALYSIS CATHETER  01/14/2016   Procedure: INSERTION OF DIALYSIS CATHETER;  Surgeon: Waynetta Sandy,  MD;  Location: [place];  Service: Vascular;;  . INSERTION OF DIALYSIS CATHETER Right 08/07/2017   Procedure: INSERTION OF DIALYSIS CATHETER;  Surgeon: Waynetta Sandy, MD;  Location: [place];  Service: Vascular;  Laterality: Right;  . INSERTION OF DIALYSIS CATHETER Right 12/21/2017   Procedure: RIGHT FEMORAL DIALYSIS CATHETER EXCHANGE;  Surgeon: Marty Heck, MD;  Location: [place];  Service: Vascular;  Laterality: Right;  . INSERTION OF ILIAC STENT Left 01/14/2016   Procedure: INSERTION OF ILIAC STENT;  Surgeon: Waynetta Sandy, MD;  Location: [place];  Service:  Vascular;  Laterality: Left;  . INTRAOPERATIVE ARTERIOGRAM Left 01/14/2016   Procedure: INTRA OPERATIVE ARTERIOGRAM;  Surgeon: Waynetta Sandy, MD;  Location: [place];  Service: Vascular;  Laterality: Left;  . LITHOTRIPSY  x 3   "& a laser treatment" (02/29/2016)  . NEPHROSTOMY Bilateral 1968  . PATELLAR TENDON REPAIR Right 1990s   "big incision"  . PERIPHERAL VASCULAR CATHETERIZATION  01/14/2016   Procedure: A/V SHUNTOGRAM;  Surgeon: Waynetta Sandy, MD;  Location: [place];  Service: Vascular;;  . REVISION OF ARTERIOVENOUS GORETEX GRAFT Left 10/16/2012   Procedure: REVISION OF LEFT FEMORAL LOOP ARTERIOVENOUS GORETEX GRAFT;  Surgeon: Rosetta Posner, MD;  Location: [place];  Service: Vascular;  Laterality: Left;  . REVISION OF ARTERIOVENOUS GORETEX GRAFT Left 02/11/2015   Procedure: EXCISION OF SMALL SEGMENT OF EXPOSED LEFT THIGH NON FUNCTIONING  ARTERIOVENOUS GORETEX GRAFT;  Surgeon: Mal Misty, MD;  Location: [place];  Service: Vascular;  Laterality: Left;  . REVISION OF ARTERIOVENOUS GORETEX GRAFT Left 01/14/2016   Procedure: REVISION OF ARTERIOVENOUS GORETEX GRAFT;  Surgeon: Waynetta Sandy, MD;  Location: [place];  Service: Vascular;  Laterality: Left;  . REVISION OF ARTERIOVENOUS GORETEX GRAFT Left 02/29/2016   thigh/pt report  . REVISION OF ARTERIOVENOUS GORETEX GRAFT Left 02/29/2016   Procedure: POSSIBLE REVISION OF LEFT THIGH ARTERIOVENOUS GORETEX GRAFT;  Surgeon: Waynetta Sandy, MD;  Location: [place];  Service: Vascular;  Laterality: Left;  . TEE WITHOUT CARDIOVERSION N/A 12/14/2017   Procedure: TRANSESOPHAGEAL ECHOCARDIOGRAM (TEE);  Surgeon: Acie Fredrickson, Wonda Cheng, MD;  Location: [place];  Service: Cardiovascular;  Laterality: N/A;  . THROMBECTOMY AND REVISION OF ARTERIOVENTOUS (AV) GORETEX  GRAFT Left 06/12/2018   Procedure: Removal of infected left thigh graft;  Surgeon: Rosetta Posner, MD;  Location: [place];  Service: Vascular;  Laterality: Left;  . THROMBECTOMY  W/ EMBOLECTOMY Left 01/14/2016   Procedure: THROMBECTOMY ARTERIOVENOUS GORE-TEX Left thigh GRAFT;  Surgeon: Waynetta Sandy, MD;  Location: [place];  Service: Vascular;  Laterality: Left;  . TONGUE SURGERY  ~ 1990   tongue-tie release   . ULTRASOUND GUIDANCE FOR VASCULAR ACCESS Right 08/07/2017   Procedure: ULTRASOUND GUIDANCE FOR VASCULAR CANNULATION RIGHT FEMORAL VEIN AND LEFT AV FEMORAL GRAFT;  Surgeon: Waynetta Sandy, MD;  Location: [place];  Service: Vascular;  Laterality: Right;  . UPPER EXTREMITY VENOGRAPHY Bilateral 08/09/2017   Procedure: UPPER EXTREMITY VENOGRAPHY;  Surgeon: Serafina Mitchell, MD;  Location: [place] [facility];  Service: Cardiovascular;  Laterality: Bilateral;  . VENOGRAM N/A 09/20/2017   Procedure: RIGHT COMMON FEMORAL ARTERY EXPLORATION.  CANNULATION RIGHT COMMON FEMORAL VEIN. VENOGRAM CENTRAL ULTRA SOUND GUIDED RIGHT FEMORAL VEIN TIMES TWO.;  Surgeon: Waynetta Sandy, MD;  Location: [facility];  Service: Vascular;  Laterality: N/A;  . WOUND DEBRIDEMENT Left 02/29/2016   thigh      Family History  Problem Relation Age of Onset  . Diabetes Mother   .  Hypertension Mother   . Heart disease Mother        before age 69  . Diabetes Father   . Heart attack Father        X's 3  . Diabetes Sister   . Bipolar disorder Sister       reports that he has never smoked. He has never used smokeless tobacco. He reports previous alcohol use. He reports previous drug use. Drug: Marijuana.   Allergies  Allergen Reactions  . Hydrocodone Other (See Comments)    Caused involuntary movement and twitching. CANNOT TAKE DUE TO MUSCLE SPASMS AND MUSCLE TREMORS  . Furadantin [Nitrofurantoin] Other (See Comments)    UNSPECIFIED REACTION   . Mandelamine [Methenamine] Other (See Comments)    UNSPECIFIED REACTION   . Noroxin [Norfloxacin] Other (See Comments)    UNSPECIFIED REACTION   . [place] Nausea Only  . Contrast Media [Iodinated Diagnostic Agents] Nausea And  Vomiting    Oral dye causes vomiting, IV dye is okay  . Metrizamide Nausea And Vomiting    Oral dye causes vomiting, IV dye is okay  . Sulfa Antibiotics Other (See Comments) and Cough    Childhood reaction - pt could not confirm that it was a cough  . Sulfur Cough    Childhood reaction - pt could not confirm that it was a cough    Prior to Admission medications   Medication Sig Start Date End Date Taking? Authorizing Provider  aspirin 325 MG EC tablet Take 325 mg by mouth daily.   Yes [provider]  calcium acetate (PHOSLO) 667 MG capsule Take 3-4 capsules (2,001-2,668 mg total) by mouth See admin instructions. Take 2708 mg by mouth 3 times daily with meals, take 2001 mg by mouth with snacks Patient taking differently: Take 2,001-2,668 mg by mouth See admin instructions. Take 2,668 mg by mouth three times a day with meals and 2,001 mg with optional snacks 01/14/18  Yes Hennie Duos, MD  clopidogrel (PLAVIX) 75 MG tablet Take 1 tablet (75 mg total) by mouth daily. Patient taking differently: Take 75 mg by mouth at bedtime.  01/14/18  Yes Hennie Duos, MD  famotidine (PEPCID) 40 MG tablet Take 1 tablet (40 mg total) by mouth daily at 2 PM. 01/14/18  Yes Hennie Duos, MD  fluticasone [facility]) 50 MCG/ACT nasal spray Place 2 sprays into both nostrils as needed for allergies or rhinitis (congestion). Patient taking differently: Place 2 sprays into both nostrils as needed for allergies or rhinitis (or congestion).  01/14/18  Yes Hennie Duos, MD  levothyroxine (SYNTHROID, LEVOTHROID) 125 MCG tablet Take 1 tablet (125 mcg total) by mouth daily before breakfast. 01/14/18  Yes Hennie Duos, MD  LORazepam (ATIVAN) 1 MG tablet Take 1 mg by mouth daily.   Yes [provider]  Nutritional Supplements (NEPRO PO) Take 1 Can by mouth 2 (two) times daily.    Yes [provider]  oxyCODONE-acetaminophen (PERCOCET) 10-325 MG tablet Take 1 tablet by mouth every 6  (six) hours as needed for pain. 06/14/18  Yes Dagoberto Ligas, PA-C  promethazine (PHENERGAN) 25 MG tablet Take 1 tablet (25 mg total) by mouth 2 (two) times daily as needed for nausea or vomiting. 01/14/18  Yes Hennie Duos, MD  sodium polystyrene (KAYEXALATE) powder Take 15 g by mouth See admin instructions. Take 15 g (mixed in water) by mouth on Saturdays and Sundays 01/14/18  Yes Hennie Duos, MD  traZODone (DESYREL) 100 MG tablet  Take 1 tablet (100 mg total) by mouth at bedtime. 01/14/18  Yes Hennie Duos, MD  zolpidem (AMBIEN) 10 MG tablet Take 1 tablet (10 mg total) by mouth at bedtime. 01/14/18  Yes Hennie Duos, MD  methocarbamol (ROBAXIN) 500 MG tablet Take 1 tablet (500 mg total) by mouth every 6 (six) hours as needed for muscle spasms. Patient not taking: Reported on 11/26/2018 01/14/18   Hennie Duos, MD  midodrine (PROAMATINE) 5 MG tablet Take 1 tablet (5 mg total) by mouth 3 (three) times daily with meals. Patient not taking: Reported on 11/26/2018 01/14/18   Hennie Duos, MD  Polyethylene Glycol 3350 POWD Take 17 g by mouth daily as needed. [place] 1 CAPFUL (17GM) IN 4-8OZ. OF WATER AND DRINK BY MOUTH DAILY AS NEEDED FOR MILD CONSTIPATION Patient not taking: Reported on 11/26/2018 01/14/18   Hennie Duos, MD  sodium zirconium cyclosilicate (LOKELMA) 10 g PACK packet Take 10 g by mouth daily. TAKE 10GM PACK ON OFF DIALYSIS DAYS ON TUESDAY ON THURSDAY ON SUNDAY Patient not taking: Reported on 11/26/2018 01/14/18   Hennie Duos, MD     Anti-infectives (From admission, onward)   Start     Dose/Rate Route Frequency Ordered Stop   11/26/18 1700  cefTRIAXone (ROCEPHIN) 1 g in sodium chloride 0.9 % 100 mL IVPB     1 g 200 mL/hr over 30 Minutes Intravenous Every 24 hours 11/26/18 1543        Results for orders placed or performed during the hospital encounter of 11/25/18 (from the past 48 hour(s))  CBC with Differential     Status: Abnormal   Collection  Time: 11/26/18  1:38 AM  Result Value Ref Range   WBC 12.1 (H) 4.0 - 10.5 K/uL   RBC 3.44 (L) 4.22 - 5.81 MIL/uL   Hemoglobin 10.6 (L) 13.0 - 17.0 g/dL   HCT 33.6 (L) 39.0 - 52.0 %   MCV 97.7 80.0 - 100.0 fL   MCH 30.8 26.0 - 34.0 pg   MCHC 31.5 30.0 - 36.0 g/dL   RDW 13.9 11.5 - 15.5 %   Platelets 190 150 - 400 K/uL   nRBC 0.0 0.0 - 0.2 %   Neutrophils Relative % 92 %   Neutro Abs 11.1 (H) 1.7 - 7.7 K/uL   Lymphocytes Relative 2 %   Lymphs Abs 0.2 (L) 0.7 - 4.0 K/uL   Monocytes Relative 4 %   Monocytes Absolute 0.5 0.1 - 1.0 K/uL   Eosinophils Relative 0 %   Eosinophils Absolute 0.0 0.0 - 0.5 K/uL   Basophils Relative 0 %   Basophils Absolute 0.0 0.0 - 0.1 K/uL   Immature Granulocytes 2 %   Abs Immature Granulocytes 0.22 (H) 0.00 - 0.07 K/uL    Comment: Performed at [facility], [address] [place], [place] [address]  Comprehensive metabolic panel     Status: Abnormal   Collection Time: 11/26/18  1:38 AM  Result Value Ref Range   Sodium 135 135 - 145 mmol/L   Potassium 4.3 3.5 - 5.1 mmol/L   Chloride 94 (L) 98 - 111 mmol/L   CO2 22 22 - 32 mmol/L   Glucose, Bld 129 (H) 70 - 99 mg/dL   BUN 48 (H) 6 - 20 mg/dL   Creatinine, Ser 10.46 (H) 0.61 - 1.24 mg/dL   Calcium 9.1 8.9 - 10.3 mg/dL   Total Protein 9.1 (H) 6.5 - 8.1 g/dL   Albumin 3.2 (L)  3.5 - 5.0 g/dL   AST 15 15 - 41 U/L   ALT 13 0 - 44 U/L   Alkaline Phosphatase 84 38 - 126 U/L   Total Bilirubin 1.1 0.3 - 1.2 mg/dL   GFR calc non Af Amer 5 (L) >60 mL/min   GFR calc Af Amer 6 (L) >60 mL/min   Anion gap 19 (H) 5 - 15    Comment: Performed at [facility] [address], [place], [place] [address]  Lipase, blood     Status: None   Collection Time: 11/26/18  1:38 AM  Result Value Ref Range   Lipase 23 11 - 51 U/L    Comment: Performed at [facility] [address], [place], [place] [address]  Creatinine, serum     Status: Abnormal   Collection Time: 11/26/18  6:00 AM  Result Value Ref  Range   Creatinine, Ser 10.63 (H) 0.61 - 1.24 mg/dL   GFR calc non Af Amer 5 (L) >60 mL/min   GFR calc Af Amer 6 (L) >60 mL/min    Comment: Performed at [facility] [address] [place], [place] [address]  CBC     Status: Abnormal   Collection Time: 11/26/18  6:00 AM  Result Value Ref Range   WBC 13.5 (H) 4.0 - 10.5 K/uL   RBC 3.43 (L) 4.22 - 5.81 MIL/uL   Hemoglobin 10.4 (L) 13.0 - 17.0 g/dL   HCT 33.4 (L) 39.0 - 52.0 %   MCV 97.4 80.0 - 100.0 fL   MCH 30.3 26.0 - 34.0 pg   MCHC 31.1 30.0 - 36.0 g/dL   RDW 14.1 11.5 - 15.5 %   Platelets 213 150 - 400 K/uL   nRBC 0.0 0.0 - 0.2 %    Comment: Performed at [facility], [place] [address], [place], [place] [address]  Protime-INR     Status: None   Collection Time: 11/26/18  8:15 AM  Result Value Ref Range   Prothrombin Time 14.7 11.4 - 15.2 seconds   INR 1.2 0.8 - 1.2    Comment: (NOTE) INR goal varies based on device and disease states. Performed at [facility], [place] [address], [place], [place] [address]   POC SARS Coronavirus 2 Ag-ED - Nasal Swab (BD Veritor Kit)     Status: None   Collection Time: 11/26/18 12:57 PM  Result Value Ref Range   SARS Coronavirus 2 Ag NEGATIVE NEGATIVE    Comment: (NOTE) SARS-CoV-2 antigen NOT DETECTED.  Negative results are presumptive.  Negative results do not preclude SARS-CoV-2 infection and should not be used as the sole basis for treatment or other patient management decisions, including infection  control decisions, particularly in the presence of clinical signs and  symptoms consistent with COVID-19, or in those who have been in contact with the virus.  Negative results must be combined with clinical observations, patient history, and epidemiological information. The expected result is Negative. Fact Sheet for Patients: PodPark.tn Fact Sheet for Healthcare Providers: GiftContent.is This test is not yet  approved or cleared by the [place] FDA and  has been authorized for detection and/or diagnosis of SARS-CoV-2 by FDA under an Emergency Use Authorization (EUA).  This EUA will remain in effect (meaning this test can be used) for the duration of  the COVID-19 de claration under Section 564(b)(1) of the Act, 21 U.S.C. section 360bbb-3(b)(1), unless the authorization is terminated or revoked sooner.    Marland Kitchen  ROS: seehpi   Physical Exam: Vitals:   11/26/18 1300 11/26/18 1315  BP: (!) 162/98 (!) 170/98  Pulse: 80 76  Resp: 20 18  Temp:    SpO2: 99% 97%     General: in ER in discomfort but not distress    sitting in chair  / alert ,   HEENT: [place] , Eo mi, MM dry  Neck: no jvd appreciated  Heart: Reg tachy, 1/6 sem ,rub ,gallop Lungs: CTA,nonlaboreded breathing  Abdomen: obese, BS pos , R Lower quad pain, R lower quad Ileal conduit  Bag  scant urine  Extremities: trace pedal edema  Skin: no overt rash Neuro:  / bilat lower extrem weakness , Ox3, no new acute focal deficits appreciated  Dialysis Access: R fem pc   Dialysis Orders: [facility]   on mwf  . EDW 123 kg ( leaving below edw ) 4hr 21mn  HD Bath 2k 2.25ca   Heparin 3,700. Access R fem pc  BFR 350 DFR 800    Hec 3 mcg IV/HD  Mircera  75 mcg  ( last on 11/24/18 ) was on q 4 wks )   Units IV/HD  Venofer  106mx 10 start 11/25- 12/10 /20    Assessment/Plan 1. ESRD -  HD Normal schedule mwf Holiday sched= Sunday Tues , Friday. plan  Hd today use no hep .permcath cath flo  [place] after hd as at op unit . Fu k on op kayexalate  Mostly wknds  2. Left ureteral calculi and moderate hydronephrosis with calyceal rupture and significant pain.- Urology  WuElba Barman Iv antibiotics per admit  And pain control 3. Hypertension/volume  - Volume and bp ok lower edw at dc with wt loss / uses  midodrine  4. Anemia  - hgb  10.4  esa recntly given/ fe load  After infection treated  5. Metabolic bone disease -  Iv hec and binders  6. Anxiety on  pren Lorazepam rx  Per admit  7.  Spina bifida   DaErnest HaberPA-C [phone]/24/2020, 2:33 PM   Pt seen, examined and agree w A/P as above.  RoKelly SplinterMD 11/26/2018, 8:37 PM

## 2018-11-26 NOTE — Progress Notes (Signed)
Patient seen and examined. Admitted after midnight secondary to left flank pain. Patient has hx of spina bifida and is chronically on HD. Found with hydronephrosis and obstructing nephrolithiasis. Elevated WBC's and concerns for developing pyelonephritis. Patient is hemodynamically stable currently, complaining of left flank pain and nausea. Urology has recommended for percutaneous nephrostomy tube placement by IR, continue abx's and outpatient follow up for anterior ureteroscopy and definitive treatment. Please refer to H&P written by Dr. Maudie Mercury for further info/details on admission.  Plan: -nephrology consult for HD continuation. -continue IV antibiotics and follow culture results. -IR consultation for percutaneous nephrostomy tube placement -PRN analgesics and antiemetics.  -continue supportive care and follow clinical response.   Barton Dubois MD [phone]

## 2018-11-26 NOTE — ED Notes (Signed)
IR PA at bedside.  

## 2018-11-26 NOTE — ED Provider Notes (Signed)
[facility] EMERGENCY DEPARTMENT Provider Note   CSN: [id number] Arrival date & time: 11/25/18  1726     History   Chief Complaint Chief Complaint  Patient presents with   Groin Pain    HPI [name] is a 53 y.o. male.     HPI  This a 53 year old male with a history of end-stage renal disease, kidney stones, spina bifida, chronic pain who presents with left groin pain.  Patient reports 2-week history of left flank and left groin pain.  He states every couple of years "my kidney stones move" and he has pain.  He states the pain has become unrelenting.  It is mostly now in his left groin area.  He rates pain at 10 out of 10.  Nothing seems to make it better or worse.  His home oxycodone "is not touching it."  He reports nausea without vomiting.  He has a urostomy and still makes a little bit of urine.  Last dialysis session was Sunday.  Denies any recent fevers, cough, respiratory symptoms.  Past Medical History:  Diagnosis Date   Anemia    Anxiety    Constipation    End stage renal failure on dialysis [facility]    [place]; MWF, on HD since 1997, exhausted upper ext access, and possibly LE access; cath dependent in R groin as of 06/2018   GERD (gastroesophageal reflux disease)    Grand mal seizure ([place]) X 4   last 1998 (02/29/2016)   Heart murmur    no problems with per pt. - nephrologist and Dr. Coletta Memos   History of blood transfusion 2000s   "I was having blood loss; never found out from where"   History of kidney stones    Hypertension    hx of - has not taken bp meds in over 2 years   Hypothyroidism    Insomnia    Migraine    "due to my BP in my late 1990s; none since" (02/29/2016)   Nonhealing surgical wound    of the left arteriovenous graft   Pneumonia    x 2   Shortness of breath    rarely   Spina bifida [facility])     Patient Active Problem List   Diagnosis Date Noted   ESRD on dialysis ([place]) 06/12/2018   PAD  (peripheral artery disease) ([place]) 12/22/2017   Problem with dialysis access ([place]) 12/21/2017   Anemia 12/21/2017   Hypothyroidism 12/21/2017   GERD (gastroesophageal reflux disease) 12/21/2017   Anxiety 12/21/2017   Left great toe amputee ([place]) 12/19/2017   Endocarditis of mitral valve    Osteomyelitis of toe of left foot ([place])    MSSA bacteremia 12/08/2017   Fever    Right flank pain    ESRD (end stage renal disease) ([place]) 09/20/2017   ESRD (end stage renal disease) on dialysis ([place]) 08/07/2017   Nonhealing surgical wound 02/29/2016   Clotted dialysis access ([place]) 01/14/2016   Removal of staples [id number]   Complication of AV dialysis fistula 06/27/2012   End stage renal disease ([place]) 06/27/2012    Past Surgical History:  Procedure Laterality Date   AMPUTATION Left 12/11/2017   Procedure: Left Great Toe Amputation;  Surgeon: [name], MD;  Location: [place];  Service: Orthopedics;  Laterality: Left;   APPENDECTOMY     ARTERIOVENOUS GRAFT PLACEMENT Left 10/16/2012   left femoral goretex graft         Dr Donnetta Hutching   AV FISTULA PLACEMENT Left  06/27/2012   Procedure: EXPLORATORY LEFT THY-GRAFT PSEUDO-ANEURYSM;  Surgeon: Conrad Sebastian, MD;  Location: [place];  Service: Vascular;  Laterality: Left;  Revision of left Arteriovenus gortex graft in thigh.   COLONOSCOPY     I&D EXTREMITY Left 02/29/2016   Procedure: DEBRIDEMENT LEFT THIGH WOUND;  Surgeon: Waynetta Sandy, MD;  Location: [place];  Service: Vascular;  Laterality: Left;   ILEOSTOMY  1970s   INCISION AND DRAINAGE Left 10/16/2012   Procedure: INCISION AND Debridement left thigh graft;  Surgeon: Rosetta Posner, MD;  Location: [place];  Service: Vascular;  Laterality: Left;   INSERTION OF DIALYSIS CATHETER     INSERTION OF DIALYSIS CATHETER  01/14/2016   Procedure: INSERTION OF DIALYSIS CATHETER;  Surgeon: Waynetta Sandy, MD;  Location: [place];  Service: Vascular;;   INSERTION OF DIALYSIS  CATHETER Right 08/07/2017   Procedure: INSERTION OF DIALYSIS CATHETER;  Surgeon: Waynetta Sandy, MD;  Location: [place];  Service: Vascular;  Laterality: Right;   INSERTION OF DIALYSIS CATHETER Right 12/21/2017   Procedure: RIGHT FEMORAL DIALYSIS CATHETER EXCHANGE;  Surgeon: Marty Heck, MD;  Location: [place];  Service: Vascular;  Laterality: Right;   INSERTION OF ILIAC STENT Left 01/14/2016   Procedure: INSERTION OF ILIAC STENT;  Surgeon: Waynetta Sandy, MD;  Location: [place];  Service: Vascular;  Laterality: Left;   INTRAOPERATIVE ARTERIOGRAM Left 01/14/2016   Procedure: INTRA OPERATIVE ARTERIOGRAM;  Surgeon: Waynetta Sandy, MD;  Location: [place];  Service: Vascular;  Laterality: Left;   LITHOTRIPSY  x 3   "& a laser treatment" (02/29/2016)   NEPHROSTOMY Bilateral 1968   PATELLAR TENDON REPAIR Right 1990s   "big incision"   PERIPHERAL VASCULAR CATHETERIZATION  01/14/2016   Procedure: A/V SHUNTOGRAM;  Surgeon: Waynetta Sandy, MD;  Location: [place];  Service: Vascular;;   REVISION OF ARTERIOVENOUS GORETEX GRAFT Left 10/16/2012   Procedure: REVISION OF LEFT FEMORAL LOOP ARTERIOVENOUS GORETEX GRAFT;  Surgeon: Rosetta Posner, MD;  Location: [place];  Service: Vascular;  Laterality: Left;   REVISION OF ARTERIOVENOUS GORETEX GRAFT Left 02/11/2015   Procedure: EXCISION OF SMALL SEGMENT OF EXPOSED LEFT THIGH NON FUNCTIONING  ARTERIOVENOUS GORETEX GRAFT;  Surgeon: Mal Misty, MD;  Location: [place];  Service: Vascular;  Laterality: Left;   REVISION OF ARTERIOVENOUS GORETEX GRAFT Left 01/14/2016   Procedure: REVISION OF ARTERIOVENOUS GORETEX GRAFT;  Surgeon: Waynetta Sandy, MD;  Location: [place];  Service: Vascular;  Laterality: Left;   REVISION OF ARTERIOVENOUS GORETEX GRAFT Left 02/29/2016   thigh/pt report   REVISION OF ARTERIOVENOUS GORETEX GRAFT Left 02/29/2016   Procedure: POSSIBLE REVISION OF LEFT THIGH ARTERIOVENOUS GORETEX GRAFT;  Surgeon:  Waynetta Sandy, MD;  Location: [place];  Service: Vascular;  Laterality: Left;   TEE WITHOUT CARDIOVERSION N/A 12/14/2017   Procedure: TRANSESOPHAGEAL ECHOCARDIOGRAM (TEE);  Surgeon: Acie Fredrickson, Wonda Cheng, MD;  Location: [place];  Service: Cardiovascular;  Laterality: N/A;   THROMBECTOMY AND REVISION OF ARTERIOVENTOUS (AV) GORETEX  GRAFT Left 06/12/2018   Procedure: Removal of infected left thigh graft;  Surgeon: Rosetta Posner, MD;  Location: [place];  Service: Vascular;  Laterality: Left;   THROMBECTOMY W/ EMBOLECTOMY Left 01/14/2016   Procedure: THROMBECTOMY ARTERIOVENOUS GORE-TEX Left thigh GRAFT;  Surgeon: Waynetta Sandy, MD;  Location: [place];  Service: Vascular;  Laterality: Left;   TONGUE SURGERY  ~ 1990   tongue-tie release    ULTRASOUND GUIDANCE FOR VASCULAR ACCESS Right 08/07/2017  Procedure: ULTRASOUND GUIDANCE FOR VASCULAR CANNULATION RIGHT FEMORAL VEIN AND LEFT AV FEMORAL GRAFT;  Surgeon: Waynetta Sandy, MD;  Location: [place];  Service: Vascular;  Laterality: Right;   UPPER EXTREMITY VENOGRAPHY Bilateral 08/09/2017   Procedure: UPPER EXTREMITY VENOGRAPHY;  Surgeon: Serafina Mitchell, MD;  Location: [place] [facility];  Service: Cardiovascular;  Laterality: Bilateral;   VENOGRAM N/A 09/20/2017   Procedure: RIGHT COMMON FEMORAL ARTERY EXPLORATION.  CANNULATION RIGHT COMMON FEMORAL VEIN. VENOGRAM CENTRAL ULTRA SOUND GUIDED RIGHT FEMORAL VEIN TIMES TWO.;  Surgeon: Waynetta Sandy, MD;  Location: [place];  Service: Vascular;  Laterality: N/A;   WOUND DEBRIDEMENT Left 02/29/2016   thigh        Home Medications    Prior to Admission medications   Medication Sig Start Date End Date Taking? Authorizing Provider  aspirin 325 MG EC tablet Take 325 mg by mouth daily.    [provider]  calcium acetate (PHOSLO) 667 MG capsule Take 3-4 capsules (2,001-2,668 mg total) by mouth See admin instructions. Take 2708 mg by mouth 3 times daily with meals,  take 2001 mg by mouth with snacks 01/14/18   Hennie Duos, MD  clopidogrel (PLAVIX) 75 MG tablet Take 1 tablet (75 mg total) by mouth daily. 01/14/18   Hennie Duos, MD  famotidine (PEPCID) 40 MG tablet Take 1 tablet (40 mg total) by mouth daily at 2 PM. 01/14/18   Hennie Duos, MD  fluticasone [facility]) 50 MCG/ACT nasal spray Place 2 sprays into both nostrils as needed for allergies or rhinitis (congestion). 01/14/18   Hennie Duos, MD  levothyroxine (SYNTHROID, LEVOTHROID) 125 MCG tablet Take 1 tablet (125 mcg total) by mouth daily before breakfast. 01/14/18   Hennie Duos, MD  methocarbamol (ROBAXIN) 500 MG tablet Take 1 tablet (500 mg total) by mouth every 6 (six) hours as needed for muscle spasms. 01/14/18   Hennie Duos, MD  midodrine (PROAMATINE) 5 MG tablet Take 1 tablet (5 mg total) by mouth 3 (three) times daily with meals. 01/14/18   Hennie Duos, MD  Nutritional Supplements (NEPRO PO) Take 1 Can by mouth daily.    [provider]  oxyCODONE-acetaminophen (PERCOCET) 10-325 MG tablet Take 1 tablet by mouth every 6 (six) hours as needed for pain. 06/14/18   Dagoberto Ligas, PA-C  Polyethylene Glycol 3350 POWD Take 17 g by mouth daily as needed. [place] 1 CAPFUL (17GM) IN 4-8OZ. OF WATER AND DRINK BY MOUTH DAILY AS NEEDED FOR MILD CONSTIPATION 01/14/18   Hennie Duos, MD  promethazine (PHENERGAN) 25 MG tablet Take 1 tablet (25 mg total) by mouth 2 (two) times daily as needed for nausea or vomiting. 01/14/18   Hennie Duos, MD  sodium polystyrene (KAYEXALATE) powder Take 15 g by mouth See admin instructions. Take 15 g (mixed in water) by mouth on Saturdays and Sundays 01/14/18   Hennie Duos, MD  sodium zirconium cyclosilicate (LOKELMA) 10 g PACK packet Take 10 g by mouth daily. TAKE 10GM PACK ON OFF DIALYSIS DAYS ON TUESDAY ON THURSDAY ON SUNDAY 01/14/18   Hennie Duos, MD  traZODone (DESYREL) 100 MG tablet Take 1 tablet (100 mg total) by mouth at  bedtime. 01/14/18   Hennie Duos, MD  zolpidem (AMBIEN) 10 MG tablet Take 1 tablet (10 mg total) by mouth at bedtime. 01/14/18   Hennie Duos, MD    Family History Family History  Problem Relation Age of Onset   Diabetes  Mother    Hypertension Mother    Heart disease Mother        before age 77   Diabetes Father    Heart attack Father        X's 3   Diabetes Sister    Bipolar disorder Sister     Social History Social History   Tobacco Use   Smoking status: Never Smoker   Smokeless tobacco: Never Used  Substance Use Topics   Alcohol use: Not Currently    Alcohol/week: 0.0 standard drinks    Comment: 02/29/2016 "nothing since  the mid 1990s   Drug use: Not Currently    Types: Marijuana    Comment: 02/29/2016 "nothing since ~ 1995"     Allergies   Furadantin [nitrofurantoin], Mandelamine [methenamine], Noroxin [norfloxacin], Carmine, Contrast media [iodinated diagnostic agents], Hydrocodone, Metrizamide, Sulfa antibiotics, and Sulfur   Review of Systems Review of Systems  Constitutional: Positive for chills. Negative for fever.  Respiratory: Negative for shortness of breath.   Cardiovascular: Negative for chest pain.  Gastrointestinal: Positive for abdominal pain and nausea. Negative for constipation, diarrhea and vomiting.  Genitourinary: Positive for flank pain. Negative for dysuria and scrotal swelling.  All other systems reviewed and are negative.    Physical Exam Updated Vital Signs BP 127/72    Pulse 87    Temp (!) 97.5 F (36.4 C) (Oral)    Resp (!) 24    SpO2 100%   Physical Exam Vitals signs and nursing note reviewed.  Constitutional:      Appearance: He is well-developed. He is obese.     Comments: Chronically ill-appearing  HENT:     Head: Normocephalic and atraumatic.     Mouth/Throat:     Mouth: Mucous membranes are dry.  Eyes:     Pupils: Pupils are equal, round, and reactive to light.  Neck:     Musculoskeletal: Neck  supple.  Cardiovascular:     Rate and Rhythm: Normal rate and regular rhythm.     Heart sounds: Normal heart sounds. No murmur.  Pulmonary:     Effort: Pulmonary effort is normal. No respiratory distress.     Breath sounds: Normal breath sounds. No wheezing.  Abdominal:     General: Bowel sounds are normal.     Palpations: Abdomen is soft.     Tenderness: There is abdominal tenderness. There is no right CVA tenderness, left CVA tenderness or rebound.     Comments: Abdominal scarring noted, urostomy right lower quadrant, tenderness to palpation lower quadrant and groin area, no hernia noted  Musculoskeletal:        General: No swelling.  Skin:    General: Skin is warm and dry.  Neurological:     Mental Status: He is alert and oriented to person, place, and time.  Psychiatric:        Mood and Affect: Mood normal.      ED Treatments / Results  Labs (all labs ordered are listed, but only abnormal results are displayed) Labs Reviewed  CBC WITH DIFFERENTIAL/PLATELET - Abnormal; Notable for the following components:      Result Value   WBC 12.1 (*)    RBC 3.44 (*)    Hemoglobin 10.6 (*)    HCT 33.6 (*)    Neutro Abs 11.1 (*)    Lymphs Abs 0.2 (*)    Abs Immature Granulocytes 0.22 (*)    All other components within normal limits  COMPREHENSIVE METABOLIC PANEL - Abnormal; Notable for the  following components:   Chloride 94 (*)    Glucose, Bld 129 (*)    BUN 48 (*)    Creatinine, Ser 10.46 (*)    Total Protein 9.1 (*)    Albumin 3.2 (*)    GFR calc non Af Amer 5 (*)    GFR calc Af Amer 6 (*)    Anion gap 19 (*)    All other components within normal limits  LIPASE, BLOOD    EKG None  Radiology Ct Renal Stone Study  Result Date: 11/26/2018 CLINICAL DATA:  Flank pain, recurrent stone disease suspected EXAM: CT ABDOMEN AND PELVIS WITHOUT CONTRAST TECHNIQUE: Multidetector CT imaging of the abdomen and pelvis was performed following the standard protocol without IV  contrast. COMPARISON:  CT abdomen pelvis 12/08/2017 FINDINGS: Lower chest: Bandlike areas of scarring and/or atelectasis. Coarse calcification of the mitral annulus. Extensive calcifications upon the aortic leaflets. Three-vessel coronary artery disease. Normal cardiac size. No pericardial effusion. Hepatobiliary: No focal liver lesions. Moderate distention of the gallbladder without visible calcified gallstones or biliary ductal dilatation. Pancreas: Unremarkable. No pancreatic ductal dilatation or surrounding inflammatory changes. Spleen: Splenomegaly. No focal splenic lesions. Adrenals/Urinary Tract: Normal adrenal glands. Bilateral renal atrophy. Asymmetric enlargement of the left kidney with moderate hydroureteronephrosis. Punctate 2 mm calcification seen in the proximal left ureter (3/50 and additional radiodensity in the distal left ureter (3/59) which takes a markedly medialized course almost crossing midline prior to proceeding inferiorly to the decompressed urinary bladder. There is extensive left perinephric stranding and trace free fluid as well as periureteral stranding and urothelial thickening. Fluid attenuation cyst in the interpolar left kidney is noted. Additional fluid attenuation cysts seen in the lower pole right kidney. Additional nonobstructive bilateral nephrolithiasis is present Stomach/Bowel: Distal esophagus, stomach and duodenal sweep are unremarkable. No small bowel wall thickening or dilatation. Right lower quadrant ileostomy with peristomal herniation of several additional loops of nonobstructed small bowel. A normal appendix is visualized. No colonic dilatation or wall thickening. High attenuation inspissated material noted within the colon. There is circumferential rectal wall thickening, nonspecific. Vascular/Lymphatic: Atherosclerotic plaque within the normal caliber aorta. Right iliac vein central venous catheter tip terminates at the mid IVC. Left iliac vein stent graft is noted.  Luminal evaluation precluded on this non contrasted exam. 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 Venous collateralization the low anterior abdomen and upper abdomen as well. Reactive adenopathy in the retroperitoneum. No pathologically enlarged nodes. Reproductive: The prostate and seminal vesicles are unremarkable. Other: Circumferential body wall edema. Retroperitoneal stranding and fluid centered upon the left kidney and ureter. No free air. Small right fat containing inguinal hernia. Bowel containing peristomal hernia, as above. Postsurgical changes of the low anterior abdomen. Musculoskeletal: Muscular atrophy of the abdominal wall and pelvic girdle. Multilevel degenerative changes are present in the imaged portions of the spine. Endplate sclerosis compatible with renal osteodystrophy. Degenerative changes noted in the spine, hips and SI joints. IMPRESSION: 1. Asymmetric enlargement of the left kidney with moderate hydroureteronephrosis and extensive left perinephric stranding and trace free fluid. Punctate 2 mm calcification seen in the proximal left ureter (3/50) and additional radiodensity in the distal left ureter (3/59) which takes a markedly medialized course almost crossing midline prior to proceeding inferiorly to the decompressed urinary bladder. Free fluid about the kidney and proximal ureter may reflect a calyceal rupture. 2. Additional nonobstructive bilateral nephrolithiasis. 3. Bilateral renal atrophy and cortical thinning. 4. Right lower quadrant ileostomy with peristomal  herniation of several additional loops of nonobstructed small bowel. 5. Circumferential rectal wall thickening, nonspecific, but could be seen with proctitis though should be correlated with direct visualization to exclude underlying malignancy. 6. Circumferential body wall edema. 7. Sclerotic changes of the axial skeleton compatible with renal osteodystrophy. 8.  Vascular stenting and bypass grafting, as detailed above. Right lower extremity venous catheter terminates in the IVC. 9. Distention of the gallbladder without visible cholelithiasis or choledocholithiasis. Correlate with abdominal symptoms. If warranted, right upper quadrant ultrasound could be obtained. 10. Aortic Atherosclerosis (ICD10-I70.0). Electronically Signed   By: Lovena Le M.D.   On: 11/26/2018 01:53    Procedures Procedures (including critical care time)  Medications Ordered in ED Medications  morphine 4 MG/ML injection 4 mg (4 mg Intravenous Given 11/26/18 0114)  ondansetron (ZOFRAN) injection 4 mg (4 mg Intravenous Given 11/26/18 0114)  HYDROmorphone (DILAUDID) injection 1 mg (1 mg Intravenous Given 11/26/18 0331)     Initial Impression / Assessment and Plan / ED Course  I have reviewed the triage vital signs and the nursing notes.  Pertinent labs & imaging results that were available during my care of the patient were reviewed by me and considered in my medical decision making (see chart for details).  Clinical Course as of Nov 25 436  Tue Nov 26, 2018  [id number] Request repaged to urology.  Had not received a call.   [CH]  0435 With Dr. Claudia Desanctis, urology.  She has been advised of the CT read.  States that he will likely need a PERC nephrostomy tube.  If he is stable and not septic, this could be done as an outpatient.  However, if he requires admission, he will be evaluated by the urology team.   [CH]  [phone] Patient is not pain control.  We will plan for admission.   [CH]    Clinical Course User Index [CH] Tacha Manni, Barbette Hair, MD       Patient presents with left flank and groin pain.  Consistent with prior stones.  He is uncomfortable appearing but nontoxic.  Afebrile.  He has an ileal conduit.  Lab work obtained.  Mild leukocytosis to 12.1.  No significant metabolic abnormalities and at his baseline as a renal failure patient.  CT scan is concerning for both proximal and  distal left ureteral stones with hydronephrosis and possible calyceal rupture.  Patient is having ongoing pain despite adequate IV pain medications.  I discussed with urology.  States he will likely need a PERC nephrostomy tube at some point.  If he is unable to be pain controlled, recommend admission and they will consult later today.    Final Clinical Impressions(s) / ED Diagnoses   Final diagnoses:  Kidney stone  Hydronephrosis with urinary obstruction due to renal calculus    ED Discharge Orders    None       Merryl Hacker, MD 11/26/18 [phone]

## 2018-11-26 NOTE — ED Notes (Signed)
Urology transferred to Dr. Dina Rich

## 2018-11-27 ENCOUNTER — Encounter (HOSPITAL_COMMUNITY): Payer: Self-pay | Admitting: *Deleted

## 2018-11-27 DIAGNOSIS — N186 End stage renal disease: Secondary | ICD-10-CM | POA: Diagnosis not present

## 2018-11-27 DIAGNOSIS — R109 Unspecified abdominal pain: Secondary | ICD-10-CM | POA: Diagnosis not present

## 2018-11-27 DIAGNOSIS — E669 Obesity, unspecified: Secondary | ICD-10-CM

## 2018-11-27 DIAGNOSIS — I739 Peripheral vascular disease, unspecified: Secondary | ICD-10-CM

## 2018-11-27 DIAGNOSIS — N200 Calculus of kidney: Secondary | ICD-10-CM | POA: Diagnosis not present

## 2018-11-27 DIAGNOSIS — N132 Hydronephrosis with renal and ureteral calculous obstruction: Secondary | ICD-10-CM | POA: Diagnosis not present

## 2018-11-27 LAB — COMPREHENSIVE METABOLIC PANEL
ALT: 20 U/L (ref 0–44)
AST: 26 U/L (ref 15–41)
Albumin: 2.8 g/dL — ABNORMAL LOW (ref 3.5–5.0)
Alkaline Phosphatase: 137 U/L — ABNORMAL HIGH (ref 38–126)
Anion gap: 16 — ABNORMAL HIGH (ref 5–15)
BUN: 50 mg/dL — ABNORMAL HIGH (ref 6–20)
CO2: 24 mmol/L (ref 22–32)
Calcium: 9.1 mg/dL (ref 8.9–10.3)
Chloride: 92 mmol/L — ABNORMAL LOW (ref 98–111)
Creatinine, Ser: 10.23 mg/dL — ABNORMAL HIGH (ref 0.61–1.24)
GFR calc Af Amer: 6 mL/min — ABNORMAL LOW (ref >60)
GFR calc non Af Amer: 5 mL/min — ABNORMAL LOW (ref >60)
Glucose, Bld: 131 mg/dL — ABNORMAL HIGH (ref 70–99)
Potassium: 4.9 mmol/L (ref 3.5–5.1)
Sodium: 132 mmol/L — ABNORMAL LOW (ref 135–145)
Total Bilirubin: 2.1 mg/dL — ABNORMAL HIGH (ref 0.3–1.2)
Total Protein: 8.2 g/dL — ABNORMAL HIGH (ref 6.5–8.1)

## 2018-11-27 LAB — HIV ANTIBODY (ROUTINE TESTING W REFLEX): HIV Screen 4th Generation wRfx: NONREACTIVE

## 2018-11-27 LAB — MRSA PCR SCREENING: MRSA by PCR: NEGATIVE

## 2018-11-27 MED ORDER — PROMETHAZINE HCL 25 MG/ML IJ SOLN
INTRAMUSCULAR | Status: AC
  Administered 2018-11-27: 25 mg via INTRAVENOUS
  Filled 2018-11-27: qty 1

## 2018-11-27 MED ORDER — ZOLPIDEM TARTRATE 5 MG PO TABS
10.0000 mg | ORAL_TABLET | Freq: Every evening | ORAL | Status: DC | PRN
  Administered 2018-11-27 – 2018-12-10 (×14): 10 mg via ORAL
  Filled 2018-11-27 (×14): qty 2

## 2018-11-27 MED ORDER — HEPARIN SODIUM (PORCINE) 1000 UNIT/ML IJ SOLN
5800.0000 [IU] | INTRAMUSCULAR | Status: DC | PRN
  Administered 2018-12-02: 17:00:00 5800 [IU] via INTRAVENOUS
  Filled 2018-11-27 (×2): qty 5.8

## 2018-11-27 MED ORDER — HEPARIN SODIUM (PORCINE) 1000 UNIT/ML IJ SOLN
2400.0000 [IU] | Freq: Once | INTRAMUSCULAR | Status: AC
  Administered 2018-11-27: 2400 [IU] via INTRAVENOUS

## 2018-11-27 MED ORDER — HEPARIN SODIUM (PORCINE) 5000 UNIT/ML IJ SOLN
5000.0000 [IU] | Freq: Three times a day (TID) | INTRAMUSCULAR | Status: AC
  Administered 2018-11-27: 5000 [IU] via SUBCUTANEOUS
  Filled 2018-11-27: qty 1

## 2018-11-27 MED ORDER — ALTEPLASE 2 MG IJ SOLR
4.0000 mg | Freq: Once | INTRAMUSCULAR | Status: AC
  Administered 2018-11-27: 4 mg

## 2018-11-27 MED ORDER — OXYCODONE-ACETAMINOPHEN 5-325 MG PO TABS
ORAL_TABLET | ORAL | Status: AC
  Administered 2018-11-27: 1
  Filled 2018-11-27: qty 1

## 2018-11-27 MED ORDER — ASPIRIN EC 81 MG PO TBEC
81.0000 mg | DELAYED_RELEASE_TABLET | Freq: Every day | ORAL | Status: DC
  Administered 2018-11-28 – 2018-11-29 (×2): 81 mg via ORAL
  Filled 2018-11-27 (×2): qty 1

## 2018-11-27 MED ORDER — ALTEPLASE 2 MG IJ SOLR
INTRAMUSCULAR | Status: AC
  Filled 2018-11-27: qty 4

## 2018-11-27 MED ORDER — HEPARIN SODIUM (PORCINE) 1000 UNIT/ML IJ SOLN
INTRAMUSCULAR | Status: AC
  Filled 2018-11-27: qty 3

## 2018-11-27 MED ORDER — PROMETHAZINE HCL 25 MG/ML IJ SOLN
12.5000 mg | Freq: Once | INTRAMUSCULAR | Status: AC
  Administered 2018-11-27: 12.5 mg via INTRAVENOUS
  Filled 2018-11-27: qty 1

## 2018-11-27 NOTE — Progress Notes (Signed)
Chief Complaint: Patient was seen in consultation today for (L)PCN at the request of Dr. Wendee Beavers  Referring Physician(s): Dr. Cyndia Skeeters Dr. Jacalyn Lefevre  Supervising Physician: Arne Cleveland  Patient Status: [facility] - In-pt  History of Present Illness: [name] is a 53 y.o. male with left sided hydronephrosis and elevated WBC with persistent pain. After our discussion yesterday to NOT pursue (L)PCN, pt has had further discussions with both nephrology and Urology, who feel best to try and decompress (L)hydro in hopes of symptomatic improvement. Pt now agreeable to this. He has had prior PCN and is familiar with procedure. PMHx, meds, imaging reviewed. On chronic Plavix for hx of CAD  Past Medical History:  Diagnosis Date   Anemia    Anxiety    Constipation    End stage renal failure on dialysis [facility])    [facility]; MWF, on HD since 1997, exhausted upper ext access, and possibly LE access; cath dependent in R groin as of 06/2018   GERD (gastroesophageal reflux disease)    Grand mal seizure ([place]) X 4   last 1998 (02/29/2016)   Heart murmur    no problems with per pt. - nephrologist and Dr. Coletta Memos   History of blood transfusion 2000s   "I was having blood loss; never found out from where"   History of kidney stones    Hypertension    hx of - has not taken bp meds in over 2 years   Hypothyroidism    Insomnia    Migraine    "due to my BP in my late 1990s; none since" (02/29/2016)   Nonhealing surgical wound    of the left arteriovenous graft   Pneumonia    x 2   Shortness of breath    rarely   Spina bifida [facility])     Past Surgical History:  Procedure Laterality Date   AMPUTATION Left 12/11/2017   Procedure: Left Great Toe Amputation;  Surgeon: [name], MD;  Location: [place];  Service: Orthopedics;  Laterality: Left;   APPENDECTOMY     ARTERIOVENOUS GRAFT PLACEMENT Left 10/16/2012   left femoral goretex graft         Dr Donnetta Hutching   AV  FISTULA PLACEMENT Left 06/27/2012   Procedure: EXPLORATORY LEFT THY-GRAFT PSEUDO-ANEURYSM;  Surgeon: Conrad Gardner, MD;  Location: [place];  Service: Vascular;  Laterality: Left;  Revision of left Arteriovenus gortex graft in thigh.   COLONOSCOPY     I&D EXTREMITY Left 02/29/2016   Procedure: DEBRIDEMENT LEFT THIGH WOUND;  Surgeon: Waynetta Sandy, MD;  Location: [place];  Service: Vascular;  Laterality: Left;   ILEOSTOMY  1970s   INCISION AND DRAINAGE Left 10/16/2012   Procedure: INCISION AND Debridement left thigh graft;  Surgeon: Rosetta Posner, MD;  Location: [place];  Service: Vascular;  Laterality: Left;   INSERTION OF DIALYSIS CATHETER     INSERTION OF DIALYSIS CATHETER  01/14/2016   Procedure: INSERTION OF DIALYSIS CATHETER;  Surgeon: Waynetta Sandy, MD;  Location: [place];  Service: Vascular;;   INSERTION OF DIALYSIS CATHETER Right 08/07/2017   Procedure: INSERTION OF DIALYSIS CATHETER;  Surgeon: Waynetta Sandy, MD;  Location: [place];  Service: Vascular;  Laterality: Right;   INSERTION OF DIALYSIS CATHETER Right 12/21/2017   Procedure: RIGHT FEMORAL DIALYSIS CATHETER EXCHANGE;  Surgeon: Marty Heck, MD;  Location: [place];  Service: Vascular;  Laterality: Right;   INSERTION OF ILIAC STENT Left 01/14/2016   Procedure: INSERTION  OF ILIAC STENT;  Surgeon: Waynetta Sandy, MD;  Location: [place];  Service: Vascular;  Laterality: Left;   INTRAOPERATIVE ARTERIOGRAM Left 01/14/2016   Procedure: INTRA OPERATIVE ARTERIOGRAM;  Surgeon: Waynetta Sandy, MD;  Location: [place];  Service: Vascular;  Laterality: Left;   LITHOTRIPSY  x 3   "& a laser treatment" (02/29/2016)   NEPHROSTOMY Bilateral 1968   PATELLAR TENDON REPAIR Right 1990s   "big incision"   PERIPHERAL VASCULAR CATHETERIZATION  01/14/2016   Procedure: A/V SHUNTOGRAM;  Surgeon: Waynetta Sandy, MD;  Location: [place];  Service: Vascular;;   REVISION OF ARTERIOVENOUS GORETEX GRAFT  Left 10/16/2012   Procedure: REVISION OF LEFT FEMORAL LOOP ARTERIOVENOUS GORETEX GRAFT;  Surgeon: Rosetta Posner, MD;  Location: [place];  Service: Vascular;  Laterality: Left;   REVISION OF ARTERIOVENOUS GORETEX GRAFT Left 02/11/2015   Procedure: EXCISION OF SMALL SEGMENT OF EXPOSED LEFT THIGH NON FUNCTIONING  ARTERIOVENOUS GORETEX GRAFT;  Surgeon: Mal Misty, MD;  Location: [place];  Service: Vascular;  Laterality: Left;   REVISION OF ARTERIOVENOUS GORETEX GRAFT Left 01/14/2016   Procedure: REVISION OF ARTERIOVENOUS GORETEX GRAFT;  Surgeon: Waynetta Sandy, MD;  Location: [place];  Service: Vascular;  Laterality: Left;   REVISION OF ARTERIOVENOUS GORETEX GRAFT Left 02/29/2016   thigh/pt report   REVISION OF ARTERIOVENOUS GORETEX GRAFT Left 02/29/2016   Procedure: POSSIBLE REVISION OF LEFT THIGH ARTERIOVENOUS GORETEX GRAFT;  Surgeon: Waynetta Sandy, MD;  Location: [place];  Service: Vascular;  Laterality: Left;   TEE WITHOUT CARDIOVERSION N/A 12/14/2017   Procedure: TRANSESOPHAGEAL ECHOCARDIOGRAM (TEE);  Surgeon: Acie Fredrickson, Wonda Cheng, MD;  Location: [place];  Service: Cardiovascular;  Laterality: N/A;   THROMBECTOMY AND REVISION OF ARTERIOVENTOUS (AV) GORETEX  GRAFT Left 06/12/2018   Procedure: Removal of infected left thigh graft;  Surgeon: Rosetta Posner, MD;  Location: [place];  Service: Vascular;  Laterality: Left;   THROMBECTOMY W/ EMBOLECTOMY Left 01/14/2016   Procedure: THROMBECTOMY ARTERIOVENOUS GORE-TEX Left thigh GRAFT;  Surgeon: Waynetta Sandy, MD;  Location: [place];  Service: Vascular;  Laterality: Left;   TONGUE SURGERY  ~ 1990   tongue-tie release    ULTRASOUND GUIDANCE FOR VASCULAR ACCESS Right 08/07/2017   Procedure: ULTRASOUND GUIDANCE FOR VASCULAR CANNULATION RIGHT FEMORAL VEIN AND LEFT AV FEMORAL GRAFT;  Surgeon: Waynetta Sandy, MD;  Location: [place];  Service: Vascular;  Laterality: Right;   UPPER EXTREMITY VENOGRAPHY Bilateral 08/09/2017    Procedure: UPPER EXTREMITY VENOGRAPHY;  Surgeon: Serafina Mitchell, MD;  Location: [place] [facility];  Service: Cardiovascular;  Laterality: Bilateral;   VENOGRAM N/A 09/20/2017   Procedure: RIGHT COMMON FEMORAL ARTERY EXPLORATION.  CANNULATION RIGHT COMMON FEMORAL VEIN. VENOGRAM CENTRAL ULTRA SOUND GUIDED RIGHT FEMORAL VEIN TIMES TWO.;  Surgeon: Waynetta Sandy, MD;  Location: [place];  Service: Vascular;  Laterality: N/A;   WOUND DEBRIDEMENT Left 02/29/2016   thigh    Allergies: Hydrocodone, Furadantin [nitrofurantoin], Mandelamine [methenamine], Noroxin [norfloxacin], Carmine, Contrast media [iodinated diagnostic agents], Metrizamide, Sulfa antibiotics, and Sulfur  Medications:  Current Facility-Administered Medications:    acetaminophen (TYLENOL) tablet 650 mg, 650 mg, Oral, Q6H PRN, 650 mg at 11/26/18 0920 **OR** acetaminophen (TYLENOL) suppository 650 mg, 650 mg, Rectal, Q6H PRN, Jani Gravel, MD   [START ON 11/28/2018] aspirin EC tablet 81 mg, 81 mg, Oral, Daily, Gonfa, Taye T, MD   calcium acetate (PHOSLO) capsule 2,001 mg, 2,001 mg, Oral, PRN, Barton Dubois, MD   calcium acetate (PHOSLO) capsule 2,668  mg, 2,668 mg, Oral, TID WC, Jani Gravel, MD, 2,668 mg at 11/27/18 [id number]   cefTRIAXone (ROCEPHIN) 1 g in sodium chloride 0.9 % 100 mL IVPB, 1 g, Intravenous, Q24H, Jani Gravel, MD, Last Rate: 200 mL/hr at 11/27/18 0600, 1 g at 11/27/18 0600   Chlorhexidine Gluconate Cloth 2 % PADS 6 each, 6 each, Topical, Daily, Barton Dubois, MD, 6 each at 11/27/18 [id number]   clopidogrel (PLAVIX) tablet 75 mg, 75 mg, Oral, QHS, Jani Gravel, MD, 75 mg at 11/26/18 2154   famotidine (PEPCID) tablet 20 mg, 20 mg, Oral, Q1400, Barton Dubois, MD   feeding supplement (NEPRO CARB STEADY) liquid 237 mL, 237 mL, Oral, BID BM, Barton Dubois, MD, 237 mL at 11/26/18 1759   fluticasone (FLONASE) 50 MCG/ACT nasal spray 2 spray, 2 spray, Each Nare, PRN, Jani Gravel, MD   heparin injection 5,000 Units, 5,000  Units, Subcutaneous, Q8H, Sanjay Broadfoot, PA-C   heparin injection 5,800 Units, 5,800 Units, Intravenous, Q dialysis, Elmarie Shiley, MD   HYDROmorphone (DILAUDID) injection 1 mg, 1 mg, Intravenous, Q6H PRN, Lennox Grumbles, Belinda K, NP, 1 mg at 11/27/18 1237   levothyroxine (SYNTHROID) tablet 125 mcg, 125 mcg, Oral, QAC breakfast, Jani Gravel, MD, 125 mcg at 11/27/18 0820   LORazepam (ATIVAN) tablet 1 mg, 1 mg, Oral, Daily, Jani Gravel, MD, 1 mg at 11/27/18 0932   ondansetron [facility]) injection 4 mg, 4 mg, Intravenous, Q6H PRN, Barton Dubois, MD, 4 mg at 11/27/18 1542   oxyCODONE-acetaminophen (PERCOCET/ROXICET) 5-325 MG per tablet 1 tablet, 1 tablet, Oral, Q6H PRN, 1 tablet at 11/27/18 1542 **AND** oxyCODONE (Oxy IR/ROXICODONE) immediate release tablet 5 mg, 5 mg, Oral, Q6H PRN, Barton Dubois, MD, 5 mg at 11/27/18 1542   [START ON 11/30/2018] sodium polystyrene (KAYEXALATE) powder 15 g, 15 g, Oral, Once per day on Sun Sat, Kim, James, MD   traZODone (DESYREL) tablet 100 mg, 100 mg, Oral, Loma Sousa, MD, 100 mg at 11/26/18 2154    Family History  Problem Relation Age of Onset   Diabetes Mother    Hypertension Mother    Heart disease Mother        before age 65   Diabetes Father    Heart attack Father        X's 3   Diabetes Sister    Bipolar disorder Sister     Social History   Socioeconomic History   Marital status: Single    Spouse name: Not on file   Number of children: 0   Years of education: Not on file   Highest education level: Not on file  Occupational History   Occupation: Disabled  Social Needs   Financial resource strain: Not on file   Food insecurity    Worry: Not on file    Inability: Not on file   Transportation needs    Medical: Not on file    Non-medical: Not on file  Tobacco Use   Smoking status: Never Smoker   Smokeless tobacco: Never Used  Substance and Sexual Activity   Alcohol use: Never    Alcohol/week: 0.0 standard drinks     Frequency: Never    Comment: 02/29/2016 "nothing since  the mid 1990s   Drug use: Yes    Types: Marijuana    Comment: 02/29/2016 "nothing since ~ 1995"   Sexual activity: Yes  Lifestyle   Physical activity    Days per week: Not on file    Minutes per session: Not on file   Stress: Not  on file  Relationships   Social connections    Talks on phone: Not on file    Gets together: Not on file    Attends religious service: Not on file    Active member of club or organization: Not on file    Attends meetings of clubs or organizations: Not on file    Relationship status: Not on file  Other Topics Concern   Not on file  Social History Narrative   Not on file     Review of Systems: A 12 point ROS discussed and pertinent positives are indicated in the HPI above.  All other systems are negative.  Review of Systems  Vital Signs: BP 139/82 (BP Location: Right Arm)    Pulse 72    Temp 97.6 F (36.4 C) (Oral)    Resp 20    Ht 5\' 11"  (1.803 m)    Wt 118.9 kg    SpO2 96%    BMI 36.56 kg/m   Physical Exam Constitutional:      General: He is not in acute distress.    Appearance: Normal appearance. He is not toxic-appearing.  HENT:     Mouth/Throat:     Mouth: Mucous membranes are moist.     Pharynx: Oropharynx is clear.  Cardiovascular:     Rate and Rhythm: Normal rate and regular rhythm.     Heart sounds: Normal heart sounds.  Pulmonary:     Effort: Pulmonary effort is normal. No respiratory distress.     Breath sounds: Normal breath sounds.  Abdominal:     General: There is no distension.     Palpations: Abdomen is soft.     Tenderness: There is no abdominal tenderness.  Skin:    General: Skin is warm and dry.  Neurological:     Mental Status: He is alert.  Psychiatric:        Mood and Affect: Mood normal.        Thought Content: Thought content normal.        Judgment: Judgment normal.     Imaging: Ct Renal Stone Study  Result Date: 11/26/2018 CLINICAL DATA:   Flank pain, recurrent stone disease suspected EXAM: CT ABDOMEN AND PELVIS WITHOUT CONTRAST TECHNIQUE: Multidetector CT imaging of the abdomen and pelvis was performed following the standard protocol without IV contrast. COMPARISON:  CT abdomen pelvis 12/08/2017 FINDINGS: Lower chest: Bandlike areas of scarring and/or atelectasis. Coarse calcification of the mitral annulus. Extensive calcifications upon the aortic leaflets. Three-vessel coronary artery disease. Normal cardiac size. No pericardial effusion. Hepatobiliary: No focal liver lesions. Moderate distention of the gallbladder without visible calcified gallstones or biliary ductal dilatation. Pancreas: Unremarkable. No pancreatic ductal dilatation or surrounding inflammatory changes. Spleen: Splenomegaly. No focal splenic lesions. Adrenals/Urinary Tract: Normal adrenal glands. Bilateral renal atrophy. Asymmetric enlargement of the left kidney with moderate hydroureteronephrosis. Punctate 2 mm calcification seen in the proximal left ureter (3/50 and additional radiodensity in the distal left ureter (3/59) which takes a markedly medialized course almost crossing midline prior to proceeding inferiorly to the decompressed urinary bladder. There is extensive left perinephric stranding and trace free fluid as well as periureteral stranding and urothelial thickening. Fluid attenuation cyst in the interpolar left kidney is noted. Additional fluid attenuation cysts seen in the lower pole right kidney. Additional nonobstructive bilateral nephrolithiasis is present Stomach/Bowel: Distal esophagus, stomach and duodenal sweep are unremarkable. No small bowel wall thickening or dilatation. Right lower quadrant ileostomy with peristomal herniation of several additional loops of nonobstructed  small bowel. A normal appendix is visualized. No colonic dilatation or wall thickening. High attenuation inspissated material noted within the colon. There is circumferential rectal wall  thickening, nonspecific. Vascular/Lymphatic: Atherosclerotic plaque within the normal caliber aorta. Right iliac vein central venous catheter tip terminates at the mid IVC. Left iliac vein stent graft is noted. Luminal evaluation precluded on this non contrasted exam. 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 Venous collateralization the low anterior abdomen and upper abdomen as well. Reactive adenopathy in the retroperitoneum. No pathologically enlarged nodes. Reproductive: The prostate and seminal vesicles are unremarkable. Other: Circumferential body wall edema. Retroperitoneal stranding and fluid centered upon the left kidney and ureter. No free air. Small right fat containing inguinal hernia. Bowel containing peristomal hernia, as above. Postsurgical changes of the low anterior abdomen. Musculoskeletal: Muscular atrophy of the abdominal wall and pelvic girdle. Multilevel degenerative changes are present in the imaged portions of the spine. Endplate sclerosis compatible with renal osteodystrophy. Degenerative changes noted in the spine, hips and SI joints. IMPRESSION: 1. Asymmetric enlargement of the left kidney with moderate hydroureteronephrosis and extensive left perinephric stranding and trace free fluid. Punctate 2 mm calcification seen in the proximal left ureter (3/50) and additional radiodensity in the distal left ureter (3/59) which takes a markedly medialized course almost crossing midline prior to proceeding inferiorly to the decompressed urinary bladder. Free fluid about the kidney and proximal ureter may reflect a calyceal rupture. 2. Additional nonobstructive bilateral nephrolithiasis. 3. Bilateral renal atrophy and cortical thinning. 4. Right lower quadrant ileostomy with peristomal herniation of several additional loops of nonobstructed small bowel. 5. Circumferential rectal wall thickening, nonspecific, but could be seen with proctitis  though should be correlated with direct visualization to exclude underlying malignancy. 6. Circumferential body wall edema. 7. Sclerotic changes of the axial skeleton compatible with renal osteodystrophy. 8. Vascular stenting and bypass grafting, as detailed above. Right lower extremity venous catheter terminates in the IVC. 9. Distention of the gallbladder without visible cholelithiasis or choledocholithiasis. Correlate with abdominal symptoms. If warranted, right upper quadrant ultrasound could be obtained. 10. Aortic Atherosclerosis (ICD10-I70.0). Electronically Signed   By: Lovena Le M.D.   On: 11/26/2018 01:53    Labs:  CBC: Recent Labs    06/13/18 1040 06/14/18 0742 11/26/18 0138 11/26/18 0600  WBC 4.0 4.3 12.1* 13.5*  HGB 9.7* 9.7* 10.6* 10.4*  HCT 30.7* 31.7* 33.6* 33.4*  PLT 121* 117* 190 213    COAGS: Recent Labs    12/07/17 1115 12/13/17 0351 11/26/18 0815  INR 1.15 1.14 1.2    BMP: Recent Labs    06/12/18 1224 06/14/18 0742 11/26/18 0138 11/26/18 0600 11/27/18 0610  NA 135 135 135  --  132*  K 4.2 6.9* 4.3  --  4.9  CL 94* 98 94*  --  92*  CO2 27 25 22   --  24  GLUCOSE 79 81 129*  --  131*  BUN 26* 47* 48*  --  50*  CALCIUM 9.3 9.1 9.1  --  9.1  CREATININE 5.79* 10.02* 10.46* 10.63* 10.23*  GFRNONAA 10* 5* 5* 5* 5*  GFRAA 12* 6* 6* 6* 6*    LIVER FUNCTION TESTS: Recent Labs    12/22/17 0526 06/12/18 1224 06/14/18 0742 11/26/18 0138 11/27/18 0610  BILITOT 0.6 0.8  --  1.1 2.1*  AST 10* 15  --  15 26  ALT <5 13  --  13 20  ALKPHOS 54 56  --  84 137*  PROT 8.5* 9.0*  --  9.1* 8.2*  ALBUMIN 2.7* 3.9 3.2* 3.2* 2.8*    TUMOR MARKERS: No results for input(s): AFPTM, CEA, CA199, CHROMGRNA in the last 8760 hours.  Assessment and Plan: (L)hydronephrosis with pain and leukocytosis Plan to proceed with (L)PCN placement tomorrow. Made NPO p MN Hold Heparin after tonight's dose. Cannot hold Plavix for the usual 5 days in this setting, pt and  ordering team understand elevated risks of bleeding with this procedure. Risks and benefits of left PCN placement was discussed with the patient including, but not limited to, infection, bleeding, significant bleeding causing loss or decrease in renal function or damage to adjacent structures.   All of the patient's questions were answered, patient is agreeable to proceed.  Consent signed and in chart.    Thank you for this interesting consult.  I greatly enjoyed meeting Nio Guttilla and look forward to participating in their care.  A copy of this report was sent to the requesting provider on this date.  Electronically Signed: Ascencion Dike, PA-C 11/27/2018, 4:04 PM   I spent a total of 20 minutes in face to face in clinical consultation, greater than 50% of which was counseling/coordinating care for (L)PCN

## 2018-11-27 NOTE — Progress Notes (Signed)
Spoke with pt's nurse. PCN to be done tomorrow morning at 9am. Pt to be NPO after midnight and no blood thinners.

## 2018-11-27 NOTE — Progress Notes (Addendum)
Subjective:  Co of continued pain / Slightly better than yesterday  With incr pain med's now/ HD  Last pm cut short sec pain   Objective Vital signs in last 24 hours: Vitals:   11/27/18 0322 11/27/18 0405 11/27/18 0916 11/27/18 0918  BP: (!) 146/77  (!) 95/55 100/74  Pulse: 81  85 81  Resp: (!) 22   20  Temp: 98.9 F (37.2 C)  99.2 F (37.3 C) 99.2 F (37.3 C)  TempSrc: Oral  Oral Oral  SpO2: 99%  96% 96%  Weight: 118.9 kg     Height:  5\' 11"  (1.803 m)     Weight change:   Physical Exam: General:  sitting on edge of bed  For decr pain effect/i alert ,NAD     Heart: Reg tachy, 1/6 sem ,rub ,gallop Lungs: CTA,nonlaboreded breathing  Abdomen: obese, BS pos , R Lower quad pain, R lower quad Ileal conduit  Bag  scant urine  Extremities: trace pedal edema  Dialysis Access: R fem pc   OP Dialysis Orders: [facility]   on mwf  . EDW 123 kg ( leaving below edw ) 4hr 48min  HD Bath 2k 2.25ca   Heparin 3,700. Access R fem pc  BFR 350 DFR 800    Hec 3 mcg IV/HD  Mircera  75 mcg  ( last on 11/24/18 ) was on q 4 wks )   Units IV/HD  Venofer  100mg  x 10 start 11/25- 12/10 /20    Problem/Plan: 1. ESRD -  HD Normal schedule mwf Holiday sched= Sunday Tues , Friday. Last pm  Hd  no hep .permcath needed pre hd cath flo and was  dwelled after hd as at op unit . k 4.9   2. Left ureteral calculi and moderate hydronephrosis with calyceal rupture and significant pain.- Urology  Elba Barman / Iv antibiotics per admit/ PCN  By IR today ?  And pain control by Admit team  3. Hypertension/volume  - Volume and bp ok lower edw at dc with wt loss / uses  midodrine  Only 668 cc uf yest sec  shortened tx  2/2 flank pain   4. Anemia  - hgb  10.4  esa recntly given/ fe load  After infection treated  5. Metabolic bone disease -  Iv hec and binders  6. Anxiety on pren Lorazepam rx  Per admit  7.  Spina bifida-  Is able to stand  [place], PA-C [facility] [phone] 11/27/2018,2:21  PM  LOS: 1 day   Pt seen, examined and agree w A/P as above.  [name]  MD 11/27/2018, 5:08 PM    Labs: Basic Metabolic Panel: Recent Labs  Lab 11/26/18 0138 11/26/18 0600 11/27/18 0610  NA 135  --  132*  K 4.3  --  4.9  CL 94*  --  92*  CO2 22  --  24  GLUCOSE 129*  --  131*  BUN 48*  --  50*  CREATININE 10.46* 10.63* 10.23*  CALCIUM 9.1  --  9.1   Liver Function Tests: Recent Labs  Lab 11/26/18 0138 11/27/18 0610  AST 15 26  ALT 13 20  ALKPHOS 84 137*  BILITOT 1.1 2.1*  PROT 9.1* 8.2*  ALBUMIN 3.2* 2.8*   Recent Labs  Lab 11/26/18 0138  LIPASE 23   No results for input(s): AMMONIA in the last 168 hours. CBC: Recent Labs  Lab 11/26/18 0138 11/26/18 0600  WBC 12.1*  13.5*  NEUTROABS 11.1*  --   HGB 10.6* 10.4*  HCT 33.6* 33.4*  MCV 97.7 97.4  PLT 190 213   Cardiac Enzymes: No results for input(s): CKTOTAL, CKMB, CKMBINDEX, TROPONINI in the last 168 hours. CBG: No results for input(s): GLUCAP in the last 168 hours.  Studies/Results: Ct Renal Stone Study  Result Date: 11/26/2018 CLINICAL DATA:  Flank pain, recurrent stone disease suspected EXAM: CT ABDOMEN AND PELVIS WITHOUT CONTRAST TECHNIQUE: Multidetector CT imaging of the abdomen and pelvis was performed following the standard protocol without IV contrast. COMPARISON:  CT abdomen pelvis 12/08/2017 FINDINGS: Lower chest: Bandlike areas of scarring and/or atelectasis. Coarse calcification of the mitral annulus. Extensive calcifications upon the aortic leaflets. Three-vessel coronary artery disease. Normal cardiac size. No pericardial effusion. Hepatobiliary: No focal liver lesions. Moderate distention of the gallbladder without visible calcified gallstones or biliary ductal dilatation. Pancreas: Unremarkable. No pancreatic ductal dilatation or surrounding inflammatory changes. Spleen: Splenomegaly. No focal splenic lesions. Adrenals/Urinary Tract: Normal adrenal glands. Bilateral renal atrophy.  Asymmetric enlargement of the left kidney with moderate hydroureteronephrosis. Punctate 2 mm calcification seen in the proximal left ureter (3/50 and additional radiodensity in the distal left ureter (3/59) which takes a markedly medialized course almost crossing midline prior to proceeding inferiorly to the decompressed urinary bladder. There is extensive left perinephric stranding and trace free fluid as well as periureteral stranding and urothelial thickening. Fluid attenuation cyst in the interpolar left kidney is noted. Additional fluid attenuation cysts seen in the lower pole right kidney. Additional nonobstructive bilateral nephrolithiasis is present Stomach/Bowel: Distal esophagus, stomach and duodenal sweep are unremarkable. No small bowel wall thickening or dilatation. Right lower quadrant ileostomy with peristomal herniation of several additional loops of nonobstructed small bowel. A normal appendix is visualized. No colonic dilatation or wall thickening. High attenuation inspissated material noted within the colon. There is circumferential rectal wall thickening, nonspecific. Vascular/Lymphatic: Atherosclerotic plaque within the normal caliber aorta. Right iliac vein central venous catheter tip terminates at the mid IVC. Left iliac vein stent graft is noted. Luminal evaluation precluded on this non contrasted exam. 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 Venous collateralization the low anterior abdomen and upper abdomen as well. Reactive adenopathy in the retroperitoneum. No pathologically enlarged nodes. Reproductive: The prostate and seminal vesicles are unremarkable. Other: Circumferential body wall edema. Retroperitoneal stranding and fluid centered upon the left kidney and ureter. No free air. Small right fat containing inguinal hernia. Bowel containing peristomal hernia, as above. Postsurgical changes of the low anterior abdomen.  Musculoskeletal: Muscular atrophy of the abdominal wall and pelvic girdle. Multilevel degenerative changes are present in the imaged portions of the spine. Endplate sclerosis compatible with renal osteodystrophy. Degenerative changes noted in the spine, hips and SI joints. IMPRESSION: 1. Asymmetric enlargement of the left kidney with moderate hydroureteronephrosis and extensive left perinephric stranding and trace free fluid. Punctate 2 mm calcification seen in the proximal left ureter (3/50) and additional radiodensity in the distal left ureter (3/59) which takes a markedly medialized course almost crossing midline prior to proceeding inferiorly to the decompressed urinary bladder. Free fluid about the kidney and proximal ureter may reflect a calyceal rupture. 2. Additional nonobstructive bilateral nephrolithiasis. 3. Bilateral renal atrophy and cortical thinning. 4. Right lower quadrant ileostomy with peristomal herniation of several additional loops of nonobstructed small bowel. 5. Circumferential rectal wall thickening, nonspecific, but could be seen with proctitis though should be correlated with direct visualization to exclude underlying malignancy.  6. Circumferential body wall edema. 7. Sclerotic changes of the axial skeleton compatible with renal osteodystrophy. 8. Vascular stenting and bypass grafting, as detailed above. Right lower extremity venous catheter terminates in the IVC. 9. Distention of the gallbladder without visible cholelithiasis or choledocholithiasis. Correlate with abdominal symptoms. If warranted, right upper quadrant ultrasound could be obtained. 10. Aortic Atherosclerosis (ICD10-I70.0). Electronically Signed   By: Lovena Le M.D.   On: 11/26/2018 01:53   Medications: . cefTRIAXone (ROCEPHIN)  IV 1 g (11/27/18 0600)   . alteplase      . [START ON 11/28/2018] aspirin EC  81 mg Oral Daily  . calcium acetate  2,668 mg Oral TID WC  . Chlorhexidine Gluconate Cloth  6 each Topical  Daily  . clopidogrel  75 mg Oral QHS  . famotidine  20 mg Oral Q1400  . feeding supplement (NEPRO CARB STEADY)  237 mL Oral BID BM  . heparin  5,000 Units Subcutaneous Q8H  . levothyroxine  125 mcg Oral QAC breakfast  . LORazepam  1 mg Oral Daily  . [START ON 11/30/2018] sodium polystyrene  15 g Oral Once per day on Sun Sat  . traZODone  100 mg Oral QHS

## 2018-11-27 NOTE — Progress Notes (Signed)
PROGRESS NOTE  [name] [id number] DOB: September 17, 1965   PCP: [name], MD  Patient is from: Home  DOA: 11/25/2018 LOS: 1  Brief Narrative / Interim history: 53 year old male with history of ESRD on HD MWF, spinal bifid, neurogenic bladder/ileal conduit, seizures, migraine headache, HTN/HoTN, kidney stones, GERD and anxiety presenting with left flank pain on 11/25/2018 and admitted for left moderate hydronephrosis, calyceal rupture, nonobstructing bilateral nephrolithiasis and developing pyelonephritis as noted on CT. Urology consulted and recommended left percutaneous nephrostomy by IR, and outpatient follow-up.  Seen by IR 11/26/2018.  Per IR note, declined percutaneous nephrostomy, asking for nephrectomy.  Nephrology consulted as well.  Subjective: No major events overnight of this morning.  He says he did not decline percutaneous nephrostomy tube, and willing to have if indicated.  Complaints low abdominal pain, mainly on the left.  He rates his pain 10/10.  Pain is constant.  Could not characterize the nature of his pain.  Also reports some lower back pain mainly on the left.  Reports intermittent nausea but no emesis.  Denies bowel or bladder issue.  Objective: Vitals:   11/27/18 0322 11/27/18 0405 11/27/18 0916 11/27/18 0918  BP: (!) 146/77  (!) 95/55 100/74  Pulse: 81  85 81  Resp: (!) 22   20  Temp: 98.9 F (37.2 C)  99.2 F (37.3 C) 99.2 F (37.3 C)  TempSrc: Oral  Oral Oral  SpO2: 99%  96% 96%  Weight: 118.9 kg     Height:  5\' 11"  (1.803 m)      Intake/Output Summary (Last 24 hours) at 11/27/2018 1116 Last data filed at 11/27/2018 0930 Gross per 24 hour  Intake 1000 ml  Output 668 ml  Net 332 ml   Filed Weights   11/27/18 0010 11/27/18 0322  Weight: 119.6 kg 118.9 kg    Examination:  GENERAL: No acute distress.  Appears well.  HEENT: MMM.  Vision and hearing grossly intact.  NECK: Supple.  No apparent JVD.  RESP:  No IWOB. Good air movement  bilaterally. CVS:  RRR. Heart sounds normal.  ABD/GI/GU: Bowel sounds present. Soft.  Low abdomen and left CVA tenderness.  RLQ urostomy.  Surgical scar from previous surgery.  MSK/EXT:  No apparent deformity or edema. Moves extremities. SKIN: no apparent skin lesion or wound NEURO: Awake, alert and oriented appropriately.  No gross deficit.  PSYCH: Somewhat upset.  Assessment & Plan: Left flank pain/left moderate hydronephrosis/possible left calyceal rupture/nonobstructing bilateral nephrolithiasis/developing left pyelonephritis -Urology consulted and recommended PCN by IR.  Reportedly patient declined PCN on 11/24 but in agreement on 11/25 -Reconsulting IR -Continue IV ceftriaxone 11/23>> -We will send urinalysis and urine culture-although this might be late now  ESRD on HD MWF Bone mineral disorder -Nephrology following.  Anemia of chronic disease: Stable. -Continue monitoring  Essential hypertension/hypotension: Normotensive -Continue midodrine  Anxiety/insomnia -Continue home Ativan and trazodone  GERD -Continue PPI  History of spina bifid: Stable  Hypothyroidism -Continue home Synthroid  Class II obesity: BMI 36.56 -Encourage lifestyle change to lose weight.  History of peripheral arterial disease -Continue home Plavix and aspirin               DVT prophylaxis: Subcu heparin Code Status: Full code Family Communication: Patient and/or RN. Available if any question. Disposition Plan: Remains inpatient.  Final disposition likely home when medically stable Consultants: Urology, IR and nephrology  Procedures:  None  Microbiology summarized: COVID-19 screen negative MRSA PCR negative  Sch Meds:  Scheduled Meds: .  alteplase      . aspirin  325 mg Oral Daily  . calcium acetate  2,668 mg Oral TID WC  . Chlorhexidine Gluconate Cloth  6 each Topical Daily  . clopidogrel  75 mg Oral QHS  . famotidine  20 mg Oral Q1400  . feeding supplement (NEPRO CARB  STEADY)  237 mL Oral BID BM  . heparin  5,000 Units Subcutaneous Q8H  . levothyroxine  125 mcg Oral QAC breakfast  . LORazepam  1 mg Oral Daily  . [START ON 11/30/2018] sodium polystyrene  15 g Oral Once per day on Sun Sat  . traZODone  100 mg Oral QHS   Continuous Infusions: . cefTRIAXone (ROCEPHIN)  IV 1 g (11/27/18 0600)   PRN Meds:.acetaminophen **OR** acetaminophen, calcium acetate, fluticasone, heparin, HYDROmorphone (DILAUDID) injection, ondansetron (ZOFRAN) IV, oxyCODONE-acetaminophen **AND** oxyCODONE  Antimicrobials: Anti-infectives (From admission, onward)   Start     Dose/Rate Route Frequency Ordered Stop   11/26/18 1700  cefTRIAXone (ROCEPHIN) 1 g in sodium chloride 0.9 % 100 mL IVPB     1 g 200 mL/hr over 30 Minutes Intravenous Every 24 hours 11/26/18 1543         I have personally reviewed the following labs and images: CBC: Recent Labs  Lab 11/26/18 0138 11/26/18 0600  WBC 12.1* 13.5*  NEUTROABS 11.1*  --   HGB 10.6* 10.4*  HCT 33.6* 33.4*  MCV 97.7 97.4  PLT 190 213   BMP &GFR Recent Labs  Lab 11/26/18 0138 11/26/18 0600 11/27/18 0610  NA 135  --  132*  K 4.3  --  4.9  CL 94*  --  92*  CO2 22  --  24  GLUCOSE 129*  --  131*  BUN 48*  --  50*  CREATININE 10.46* 10.63* 10.23*  CALCIUM 9.1  --  9.1   Estimated Creatinine Clearance: 10.9 mL/min (A) (by C-G formula based on SCr of 10.23 mg/dL (H)). Liver & Pancreas: Recent Labs  Lab 11/26/18 0138 11/27/18 0610  AST 15 26  ALT 13 20  ALKPHOS 84 137*  BILITOT 1.1 2.1*  PROT 9.1* 8.2*  ALBUMIN 3.2* 2.8*   Recent Labs  Lab 11/26/18 0138  LIPASE 23   No results for input(s): AMMONIA in the last 168 hours. Diabetic: No results for input(s): HGBA1C in the last 72 hours. No results for input(s): GLUCAP in the last 168 hours. Cardiac Enzymes: No results for input(s): CKTOTAL, CKMB, CKMBINDEX, TROPONINI in the last 168 hours. No results for input(s): PROBNP in the last 8760 hours.  Coagulation Profile: Recent Labs  Lab 11/26/18 0815  INR 1.2   Thyroid Function Tests: No results for input(s): TSH, T4TOTAL, FREET4, T3FREE, THYROIDAB in the last 72 hours. Lipid Profile: No results for input(s): CHOL, HDL, LDLCALC, TRIG, CHOLHDL, LDLDIRECT in the last 72 hours. Anemia Panel: No results for input(s): VITAMINB12, FOLATE, FERRITIN, TIBC, IRON, RETICCTPCT in the last 72 hours. Urine analysis:    Component Value Date/Time   COLORURINE RED (A) 07/31/2010 0410   APPEARANCEUR TURBID (A) 07/31/2010 0410   LABSPEC 1.044 (H) 07/31/2010 0410   PHURINE 6.5 07/31/2010 0410   GLUCOSEU 100 (A) 07/31/2010 0410   HGBUR LARGE (A) 07/31/2010 0410   BILIRUBINUR LARGE (A) 07/31/2010 0410   KETONESUR 40 (A) 07/31/2010 0410   PROTEINUR >300 (A) 07/31/2010 0410   UROBILINOGEN 4.0 (H) 07/31/2010 0410   NITRITE POSITIVE (A) 07/31/2010 0410   LEUKOCYTESUR LARGE (A) 07/31/2010 0410   Sepsis Labs: Invalid  input(s): PROCALCITONIN, LACTICIDVEN  Microbiology: Recent Results (from the past 240 hour(s))  SARS CORONAVIRUS 2 (TAT 6-24 HRS) Nasopharyngeal Nasopharyngeal Swab     Status: None   Collection Time: 11/26/18  1:37 PM   Specimen: Nasopharyngeal Swab  Result Value Ref Range Status   SARS Coronavirus 2 NEGATIVE NEGATIVE Final    Comment: (NOTE) SARS-CoV-2 target nucleic acids are NOT DETECTED. The SARS-CoV-2 RNA is generally detectable in upper and lower respiratory specimens during the acute phase of infection. Negative results do not preclude SARS-CoV-2 infection, do not rule out co-infections with other pathogens, and should not be used as the sole basis for treatment or other patient management decisions. Negative results must be combined with clinical observations, patient history, and epidemiological information. The expected result is Negative. Fact Sheet for Patients: SugarRoll.be Fact Sheet for Healthcare Providers:  https://www.woods-mathews.com/ This test is not yet approved or cleared by the [place] FDA and  has been authorized for detection and/or diagnosis of SARS-CoV-2 by FDA under an Emergency Use Authorization (EUA). This EUA will remain  in effect (meaning this test can be used) for the duration of the COVID-19 declaration under Section 56 4(b)(1) of the Act, 21 U.S.C. section 360bbb-3(b)(1), unless the authorization is terminated or revoked sooner. Performed at [facility], [place] [address], [place], [place] [address]   MRSA PCR Screening     Status: None   Collection Time: 11/27/18  7:31 AM   Specimen: Nasal Mucosa; Nasopharyngeal  Result Value Ref Range Status   MRSA by PCR NEGATIVE NEGATIVE Final    Comment:        The GeneXpert MRSA Assay (FDA approved for NASAL specimens only), is one component of a comprehensive MRSA colonization surveillance program. It is not intended to diagnose MRSA infection nor to guide or monitor treatment for MRSA infections. Performed at [facility], [place] [address], [place], [place] [address]     Radiology Studies: No results found.  35 minutes with more than 50% spent in reviewing records, counseling patient/family and coordinating care.  Basem Yannuzzi T. [place]  If 7PM-7AM, please contact night-coverage [email or web address] Password [facility] 11/27/2018, 11:16 AM

## 2018-11-27 NOTE — Progress Notes (Signed)
Spoke with IR yesterday about patient PCN tube placement.  Due to lack of dilation/poor renal tissue/multiple prior PCNs it may be difficult to place PCN.  Had discussed trying to get pain under control and delaying PCN attempt if this was possible.  If patient unable to be pain controlled and IR willing to attempt PCN, this may be the best option in the short term. Patient may need nephrectomy but this will be scheduled in the future and not as management for acute pain.

## 2018-11-28 ENCOUNTER — Encounter (HOSPITAL_COMMUNITY): Payer: Self-pay | Admitting: Interventional Radiology

## 2018-11-28 ENCOUNTER — Inpatient Hospital Stay (HOSPITAL_COMMUNITY): Payer: Medicare Other

## 2018-11-28 DIAGNOSIS — R109 Unspecified abdominal pain: Secondary | ICD-10-CM | POA: Diagnosis not present

## 2018-11-28 DIAGNOSIS — N132 Hydronephrosis with renal and ureteral calculous obstruction: Secondary | ICD-10-CM | POA: Diagnosis not present

## 2018-11-28 DIAGNOSIS — N186 End stage renal disease: Secondary | ICD-10-CM | POA: Diagnosis not present

## 2018-11-28 DIAGNOSIS — D696 Thrombocytopenia, unspecified: Secondary | ICD-10-CM

## 2018-11-28 DIAGNOSIS — N200 Calculus of kidney: Secondary | ICD-10-CM | POA: Diagnosis not present

## 2018-11-28 HISTORY — PX: IR NEPHROSTOMY PLACEMENT LEFT: IMG6063

## 2018-11-28 LAB — RENAL FUNCTION PANEL
Albumin: 2.7 g/dL — ABNORMAL LOW (ref 3.5–5.0)
Anion gap: 18 — ABNORMAL HIGH (ref 5–15)
BUN: 63 mg/dL — ABNORMAL HIGH (ref 6–20)
CO2: 22 mmol/L (ref 22–32)
Calcium: 9.1 mg/dL (ref 8.9–10.3)
Chloride: 92 mmol/L — ABNORMAL LOW (ref 98–111)
Creatinine, Ser: 11.1 mg/dL — ABNORMAL HIGH (ref 0.61–1.24)
GFR calc Af Amer: 5 mL/min — ABNORMAL LOW (ref >60)
GFR calc non Af Amer: 5 mL/min — ABNORMAL LOW (ref >60)
Glucose, Bld: 108 mg/dL — ABNORMAL HIGH (ref 70–99)
Phosphorus: 2.7 mg/dL (ref 2.5–4.6)
Potassium: 3.8 mmol/L (ref 3.5–5.1)
Sodium: 132 mmol/L — ABNORMAL LOW (ref 135–145)

## 2018-11-28 LAB — CBC
HCT: 32 % — ABNORMAL LOW (ref 39.0–52.0)
Hemoglobin: 10.1 g/dL — ABNORMAL LOW (ref 13.0–17.0)
MCH: 30.1 pg (ref 26.0–34.0)
MCHC: 31.6 g/dL (ref 30.0–36.0)
MCV: 95.5 fL (ref 80.0–100.0)
Platelets: 145 10*3/uL — ABNORMAL LOW (ref 150–400)
RBC: 3.35 MIL/uL — ABNORMAL LOW (ref 4.22–5.81)
RDW: 14.4 % (ref 11.5–15.5)
WBC: 10.7 10*3/uL — ABNORMAL HIGH (ref 4.0–10.5)
nRBC: 0 % (ref 0.0–0.2)

## 2018-11-28 LAB — MAGNESIUM: Magnesium: 1.9 mg/dL (ref 1.7–2.4)

## 2018-11-28 MED ORDER — LIDOCAINE HCL 1 % IJ SOLN
INTRAMUSCULAR | Status: AC
  Filled 2018-11-28: qty 20

## 2018-11-28 MED ORDER — FENTANYL CITRATE (PF) 100 MCG/2ML IJ SOLN
INTRAMUSCULAR | Status: AC
  Filled 2018-11-28: qty 4

## 2018-11-28 MED ORDER — LIDOCAINE HCL (PF) 1 % IJ SOLN
INTRAMUSCULAR | Status: AC | PRN
  Administered 2018-11-28 (×2): 10 mL
  Administered 2018-11-28 (×2): 5 mL

## 2018-11-28 MED ORDER — MIDAZOLAM HCL 2 MG/2ML IJ SOLN
INTRAMUSCULAR | Status: AC
  Filled 2018-11-28: qty 2

## 2018-11-28 MED ORDER — CALCIUM ACETATE (PHOS BINDER) 667 MG PO CAPS
1334.0000 mg | ORAL_CAPSULE | Freq: Three times a day (TID) | ORAL | Status: DC
  Administered 2018-11-29: 1334 mg via ORAL
  Filled 2018-11-28 (×2): qty 2

## 2018-11-28 MED ORDER — CHLORHEXIDINE GLUCONATE CLOTH 2 % EX PADS
6.0000 | MEDICATED_PAD | Freq: Every day | CUTANEOUS | Status: DC
  Administered 2018-11-30 – 2018-12-01 (×2): 6 via TOPICAL

## 2018-11-28 MED ORDER — IOHEXOL 300 MG/ML  SOLN
50.0000 mL | Freq: Once | INTRAMUSCULAR | Status: AC | PRN
  Administered 2018-11-28: 20 mL

## 2018-11-28 MED ORDER — MIDAZOLAM HCL 2 MG/2ML IJ SOLN
INTRAMUSCULAR | Status: AC
  Filled 2018-11-28: qty 4

## 2018-11-28 MED ORDER — FENTANYL CITRATE (PF) 100 MCG/2ML IJ SOLN
INTRAMUSCULAR | Status: AC | PRN
  Administered 2018-11-28 (×3): 50 ug via INTRAVENOUS

## 2018-11-28 MED ORDER — FENTANYL CITRATE (PF) 100 MCG/2ML IJ SOLN
INTRAMUSCULAR | Status: AC
  Filled 2018-11-28: qty 2

## 2018-11-28 MED ORDER — MIDAZOLAM HCL 2 MG/2ML IJ SOLN
INTRAMUSCULAR | Status: AC | PRN
  Administered 2018-11-28: 2 mg via INTRAVENOUS
  Administered 2018-11-28 (×2): 1 mg via INTRAVENOUS
  Administered 2018-11-28: 2 mg via INTRAVENOUS
  Administered 2018-11-28: 1 mg via INTRAVENOUS

## 2018-11-28 MED ORDER — CLOPIDOGREL BISULFATE 75 MG PO TABS: 75.0000 mg | ORAL_TABLET | Freq: Every day | ORAL | Status: DC

## 2018-11-28 MED ORDER — SODIUM CHLORIDE 0.9% FLUSH
5.0000 mL | Freq: Three times a day (TID) | INTRAVENOUS | Status: DC
  Administered 2018-11-28 – 2018-12-11 (×25): 5 mL

## 2018-11-28 MED ORDER — FLUMAZENIL 0.5 MG/5ML IV SOLN
INTRAVENOUS | Status: AC
  Filled 2018-11-28: qty 5

## 2018-11-28 MED ORDER — NALOXONE HCL 0.4 MG/ML IJ SOLN
INTRAMUSCULAR | Status: AC
  Filled 2018-11-28: qty 1

## 2018-11-28 MED ORDER — HYDROMORPHONE HCL 1 MG/ML IJ SOLN
1.0000 mg | INTRAMUSCULAR | Status: DC | PRN
  Administered 2018-11-28 – 2018-12-01 (×11): 1 mg via INTRAVENOUS
  Filled 2018-11-28 (×11): qty 1

## 2018-11-28 MED ORDER — CLOPIDOGREL BISULFATE 75 MG PO TABS
75.0000 mg | ORAL_TABLET | Freq: Every day | ORAL | Status: DC
  Administered 2018-11-29: 75 mg via ORAL
  Filled 2018-11-28: qty 1

## 2018-11-28 NOTE — Sedation Documentation (Signed)
Report called to bedside RN.

## 2018-11-28 NOTE — Progress Notes (Addendum)
MEDICATION-RELATED CONSULT NOTE   IR Procedure Consult - Anticoagulant/Antiplatelet PTA/Inpatient Med List Review by Pharmacist    Procedure: Nephrostomy tube placement    Completed: 11/28/2018  Bleeding risk per IR Post-Procedure Guidelines: High risk  Antithrombotic medications on inpatient or PTA profile prior to procedure:     Plavix 75 mg daily at bedtime  Aspirin 81 mg daily  Recommended restart time per IR Post-Procedure Guidelines:   Hold today and restart tomorrow morning   Plan:  - Aspirin already given today at 0800, continue regular schedule tomorrow morning - Plavix rescheduled for tomorrow morning at 1000    - Monitor for signs/symptoms of bleeding since aspirin was already given  Agnes Lawrence, PharmD PGY1 Pharmacy Resident

## 2018-11-28 NOTE — Procedures (Signed)
  Procedure: LEFT perc nephrostomy placed 10f EBL:   minimal Complications:  none immediate  See full dictation in [facility] PACS.  D. Harrold Fitchett MD Main # [phone] Pager  [phone]    

## 2018-11-28 NOTE — Progress Notes (Signed)
[facility] Progress Note   Subjective:   Patient seen in room s/p left perc nephrostomy. Reports he is sore and tired. Denies SOB, orthopnea, CP, dizziness, abdominal pain. Still having some nausea and has not been able to eat. Last HD shortened due to pain  Objective Vitals:   11/28/18 1050 11/28/18 1100 11/28/18 1131 11/28/18 1217  BP: 116/69 120/72 115/75 115/73  Pulse: 72 74 76 76  Resp: 19 18 18    Temp:   97.8 F (36.6 C)   TempSrc:   Oral   SpO2: 98% 94% 97% 93%  Weight:      Height:       Physical Exam General: Well developed male, alert and in NAD Heart: RRR, + blowing systolic murmur, no rubs or gallops Lungs: CTA bilaterally without wheezing, rhonchi or rales Abdomen: Soft, non-tender, non-distended. + L per nephrostomy tube Extremities: No edema b/l lower extremities Dialysis Access: R femoral catheter  Additional Objective Labs: Basic Metabolic Panel: Recent Labs  Lab 11/26/18 0138 11/26/18 0600 11/27/18 0610 11/28/18 0031  NA 135  --  132* 132*  K 4.3  --  4.9 3.8  CL 94*  --  92* 92*  CO2 22  --  24 22  GLUCOSE 129*  --  131* 108*  BUN 48*  --  50* 63*  CREATININE 10.46* 10.63* 10.23* 11.10*  CALCIUM 9.1  --  9.1 9.1  PHOS  --   --   --  2.7   Liver Function Tests: Recent Labs  Lab 11/26/18 0138 11/27/18 0610 11/28/18 0031  AST 15 26  --   ALT 13 20  --   ALKPHOS 84 137*  --   BILITOT 1.1 2.1*  --   PROT 9.1* 8.2*  --   ALBUMIN 3.2* 2.8* 2.7*   Recent Labs  Lab 11/26/18 0138  LIPASE 23   CBC: Recent Labs  Lab 11/26/18 0138 11/26/18 0600 11/28/18 0031  WBC 12.1* 13.5* 10.7*  NEUTROABS 11.1*  --   --   HGB 10.6* 10.4* 10.1*  HCT 33.6* 33.4* 32.0*  MCV 97.7 97.4 95.5  PLT 190 213 145*   Blood Culture    Component Value Date/Time   SDES ABSCESS LEFT NEPHROSTOMY PLACEMENT 11/28/2018 1040   SPECREQUEST Normal 11/28/2018 1040   CULT PENDING 11/28/2018 1040   REPTSTATUS PENDING 11/28/2018 1040    Cardiac  Enzymes: No results for input(s): CKTOTAL, CKMB, CKMBINDEX, TROPONINI in the last 168 hours. CBG: No results for input(s): GLUCAP in the last 168 hours. Iron Studies: No results for input(s): IRON, TIBC, TRANSFERRIN, FERRITIN in the last 72 hours. @lablastinr3 @ Studies/Results: Ir Nephrostomy Placement Left  Result Date: 11/28/2018 CLINICAL DATA:  Left hydroureteronephrosis, ureteral calculus EXAM: LEFT PERCUTANEOUS NEPHROSTOMY CATHETER PLACEMENT UNDER ULTRASOUND AND FLUOROSCOPIC GUIDANCE FLUOROSCOPY TIME:  5.5 minute; 1302  uGym2 DAP TECHNIQUE: The procedure, risks (including but not limited to bleeding, infection, organ damage ), benefits, and alternatives were explained to the patient. Questions regarding the procedure were encouraged and answered. The patient understands and consents to the procedure. The leftflank region prepped with chlorhexidine, draped in usual sterile fashion, infiltrated locally with 1% lidocaine.Patient was receiving adequate prophylactic antibiotic coverage. Intravenous Fentanyl 149mcg and Versed 7mg  were administered as conscious sedation during continuous monitoring of the patient's level of consciousness and physiological / cardiorespiratory status by the radiology RN, with a total moderate sedation time of 51 minutes. Under real-time ultrasound guidance, a 21-gauge needle was advanced into a posterior lower pole calyx.  Ultrasound image documentation was saved. Bloody material could be aspirated. Contrast injection opacified central renal collecting system and proximal ureter. Needle was exchanged over a guidewire for transitional dilator. 20 mL bloody fluid were aspirated from the renal collecting system. Contrast injection confirmed appropriate positioning. Catheter was exchanged over a guidewire for a 10 French pigtail catheter, formed centrally within the left renal collecting system. Contrast injection confirms appropriate positioning and patency. Catheter secured  externally with 0 Prolene suture and StatLock, and placed to external drain bag. COMPLICATIONS: COMPLICATIONS none IMPRESSION: 1. Technically successful left percutaneous nephrostomy catheter placement. Electronically Signed   By: Lucrezia Europe M.D.   On: 11/28/2018 12:06   Medications: . cefTRIAXone (ROCEPHIN)  IV 1 g (11/27/18 1709)   . aspirin EC  81 mg Oral Daily  . calcium acetate  2,668 mg Oral TID WC  . Chlorhexidine Gluconate Cloth  6 each Topical Daily  . [START ON 11/29/2018] clopidogrel  75 mg Oral Daily  . famotidine  20 mg Oral Q1400  . feeding supplement (NEPRO CARB STEADY)  237 mL Oral BID BM  . levothyroxine  125 mcg Oral QAC breakfast  . LORazepam  1 mg Oral Daily  . sodium chloride flush  5 mL Intracatheter Q8H  . [START ON 11/30/2018] sodium polystyrene  15 g Oral Once per day on Sun Sat  . traZODone  100 mg Oral QHS    Dialysis Orders: Abelina Bachelor mwf. TQ:069705 kg ( leaving below edw ) 4hr 52minHD Bath 2k 2.25caHeparin 3,700. AccessR fem pcBFR 350DFR 800  Hec 52mcg IV/HD  Mircera 75 mcg ( last on 11/24/18 ) was on q 4 wks )Units IV/HD  Venofer 100mg  x 10 start 11/25- 12/10 /20  Assessment/Plan: 1. L ureteral calculi: With moderate hydronephrosis and calyceal rupture. In significant pain. S/p L percutaneous nephrostromy tube placement by IR on 11/26. On rocephin. Urology following.  2. ESRD: MWF, last HD 11/24 off schedule due to holiday, resume regular schedule tomorrow. Last treatment cut short due to pain but not volume overloaded today. K+ 3.8. 3. HTN/volume:  BP ok, not volume overloaded. Below outpatient EDW. Uses midodrine outpatient but BP ok here. 4. Anemia: Hgb 10.1. Not due for ESA yet. Will hold IV Fe while on IV antibiotics, resume once antibiotics are completed. 5. Secondary hyperparathyroidism:  Corr calcium 10.1. Hectorol not ordered, will continue to hold for now and resume once hypercalcemia resolved. Phos 2.7, not eating much,  decrease calcium acetate.  6. Nutrition:  Renal diet with fluid restrictions.   Anice Paganini, PA-C 11/28/2018, 1:47 PM  [facility] Pager: [phone]

## 2018-11-28 NOTE — Progress Notes (Signed)
PROGRESS NOTE  [name] [id number] DOB: 1965/06/06   PCP: [name], MD  Patient is from: Home  DOA: 11/25/2018 LOS: 2  Brief Narrative / Interim history: 53 year old male with history of ESRD on HD MWF, spinal bifid, neurogenic bladder/ileal conduit, seizures, migraine headache, HTN/HoTN, kidney stones, GERD and anxiety presenting with left flank pain on 11/25/2018 and admitted for left moderate hydronephrosis, calyceal rupture, nonobstructing bilateral nephrolithiasis and developing pyelonephritis as noted on CT. Nephrology and urology consulted.  Urology recommended left percutaneous nephrostomy by IR, and outpatient follow-up.  Seen by IR 11/26/2018.  Per IR note, declined percutaneous nephrostomy, asking for nephrectomy. However, he later agreed to nephrostomy tube placement.  IR reconsulted 11/27/2018.  Plan for left nephrostomy tube placement 11/28/2018.Marland Kitchen    Subjective: No major events overnight of this morning.  Continues to endorse significant left flank pain that he rates as 10/10.  He says there is no improvement with IV Dilaudid.  He will see can get IV Phenergan so that he can get some sleep.  He is vague about the description of the pain.  Objective: Vitals:   11/28/18 1035 11/28/18 1040 11/28/18 1045 11/28/18 1050  BP: 118/70 112/71 119/69 116/69  Pulse: 73 72 73 72  Resp: 17 15 16 19   Temp:      TempSrc:      SpO2: 99% 99% 98% 98%  Weight:      Height:        Intake/Output Summary (Last 24 hours) at 11/28/2018 1059 Last data filed at 11/28/2018 0200 Gross per 24 hour  Intake 360 ml  Output 0 ml  Net 360 ml   Filed Weights   11/27/18 0010 11/27/18 0322  Weight: 119.6 kg 118.9 kg    Examination:  GENERAL: No acute distress.  Sitting on bedside chair. HEENT: MMM.  Vision and hearing grossly intact.  NECK: Supple.  No apparent JVD.  RESP:  No IWOB. Good air movement bilaterally. CVS:  RRR. Heart sounds normal.  ABD/GI/GU: Bowel sounds  present. Soft.  Low abdomen and left CVA tenderness.  RLQ urostomy. MSK/EXT:  Moves extremities. No apparent deformity or edema.  SKIN: Abdominal surgical scar from previous surgery. NEURO: Awake, alert and oriented appropriately.  No apparent focal neuro deficit. PSYCH: Calm.  Somewhat flat affect.  Assessment & Plan: Left flank pain/left moderate hydronephrosis/possible left calyceal rupture/nonobstructing bilateral nephrolithiasis/developing left pyelonephritis -Urology consulted and recommended PCN by IR.  Reportedly patient declined PCN on 11/24 but in agreement on 11/25 -Plan for left PCN today. -Continue IV ceftriaxone 11/23>> -Follow urine culture from the drain  ESRD on HD MWF Bone mineral disorder -Nephrology following.  Anemia of chronic disease: Stable. -Continue monitoring  Essential hypertension/hypotension: Normotensive -Continue midodrine  Anxiety/insomnia -Continue home Ativan, trazodone and Ambien  GERD -Continue PPI  History of spina bifid: Stable  Hypothyroidism -Continue home Synthroid  Class II obesity: BMI 36.56 -Encourage lifestyle change to lose weight.  History of peripheral arterial disease -Continue home Plavix and aspirin  Thrombocytopenia: Platelet 145 (213 two days ago).  He is low probability for HIT. -Recheck in the morning               DVT prophylaxis: Subcu heparin Code Status: Full code Family Communication: Patient and/or RN. Available if any question. Disposition Plan: Remains inpatient.  Final disposition likely home when medically stable Consultants: Urology, IR and nephrology  Procedures:  None  Microbiology summarized: COVID-19 screen negative MRSA PCR negative  Sch Meds:  Scheduled Meds: .  aspirin EC  81 mg Oral Daily  . calcium acetate  2,668 mg Oral TID WC  . Chlorhexidine Gluconate Cloth  6 each Topical Daily  . clopidogrel  75 mg Oral QHS  . famotidine  20 mg Oral Q1400  . feeding supplement (NEPRO  CARB STEADY)  237 mL Oral BID BM  . levothyroxine  125 mcg Oral QAC breakfast  . lidocaine      . lidocaine      . LORazepam  1 mg Oral Daily  . [START ON 11/30/2018] sodium polystyrene  15 g Oral Once per day on Sun Sat  . traZODone  100 mg Oral QHS   Continuous Infusions: . cefTRIAXone (ROCEPHIN)  IV 1 g (11/27/18 1709)   PRN Meds:.acetaminophen **OR** acetaminophen, calcium acetate, fentaNYL, fluticasone, heparin, HYDROmorphone (DILAUDID) injection, lidocaine (PF), midazolam, ondansetron (ZOFRAN) IV, oxyCODONE-acetaminophen **AND** oxyCODONE, zolpidem  Antimicrobials: Anti-infectives (From admission, onward)   Start     Dose/Rate Route Frequency Ordered Stop   11/26/18 1700  cefTRIAXone (ROCEPHIN) 1 g in sodium chloride 0.9 % 100 mL IVPB     1 g 200 mL/hr over 30 Minutes Intravenous Every 24 hours 11/26/18 1543         I have personally reviewed the following labs and images: CBC: Recent Labs  Lab 11/26/18 0138 11/26/18 0600 11/28/18 0031  WBC 12.1* 13.5* 10.7*  NEUTROABS 11.1*  --   --   HGB 10.6* 10.4* 10.1*  HCT 33.6* 33.4* 32.0*  MCV 97.7 97.4 95.5  PLT 190 213 145*   BMP &GFR Recent Labs  Lab 11/26/18 0138 11/26/18 0600 11/27/18 0610 11/28/18 0031  NA 135  --  132* 132*  K 4.3  --  4.9 3.8  CL 94*  --  92* 92*  CO2 22  --  24 22  GLUCOSE 129*  --  131* 108*  BUN 48*  --  50* 63*  CREATININE 10.46* 10.63* 10.23* 11.10*  CALCIUM 9.1  --  9.1 9.1  MG  --   --   --  1.9  PHOS  --   --   --  2.7   Estimated Creatinine Clearance: 10.1 mL/min (A) (by C-G formula based on SCr of 11.1 mg/dL (H)). Liver & Pancreas: Recent Labs  Lab 11/26/18 0138 11/27/18 0610 11/28/18 0031  AST 15 26  --   ALT 13 20  --   ALKPHOS 84 137*  --   BILITOT 1.1 2.1*  --   PROT 9.1* 8.2*  --   ALBUMIN 3.2* 2.8* 2.7*   Recent Labs  Lab 11/26/18 0138  LIPASE 23   No results for input(s): AMMONIA in the last 168 hours. Diabetic: No results for input(s): HGBA1C in the  last 72 hours. No results for input(s): GLUCAP in the last 168 hours. Cardiac Enzymes: No results for input(s): CKTOTAL, CKMB, CKMBINDEX, TROPONINI in the last 168 hours. No results for input(s): PROBNP in the last 8760 hours. Coagulation Profile: Recent Labs  Lab 11/26/18 0815  INR 1.2   Thyroid Function Tests: No results for input(s): TSH, T4TOTAL, FREET4, T3FREE, THYROIDAB in the last 72 hours. Lipid Profile: No results for input(s): CHOL, HDL, LDLCALC, TRIG, CHOLHDL, LDLDIRECT in the last 72 hours. Anemia Panel: No results for input(s): VITAMINB12, FOLATE, FERRITIN, TIBC, IRON, RETICCTPCT in the last 72 hours. Urine analysis:    Component Value Date/Time   COLORURINE RED (A) 07/31/2010 0410   APPEARANCEUR TURBID (A) 07/31/2010 0410   LABSPEC 1.044 (H) 07/31/2010 0410  PHURINE 6.5 07/31/2010 0410   GLUCOSEU 100 (A) 07/31/2010 0410   HGBUR LARGE (A) 07/31/2010 0410   BILIRUBINUR LARGE (A) 07/31/2010 0410   KETONESUR 40 (A) 07/31/2010 0410   PROTEINUR >300 (A) 07/31/2010 0410   UROBILINOGEN 4.0 (H) 07/31/2010 0410   NITRITE POSITIVE (A) 07/31/2010 0410   LEUKOCYTESUR LARGE (A) 07/31/2010 0410   Sepsis Labs: Invalid input(s): PROCALCITONIN, [place]  Microbiology: Recent Results (from the past 240 hour(s))  SARS CORONAVIRUS 2 (TAT 6-24 HRS) Nasopharyngeal Nasopharyngeal Swab     Status: None   Collection Time: 11/26/18  1:37 PM   Specimen: Nasopharyngeal Swab  Result Value Ref Range Status   SARS Coronavirus 2 NEGATIVE NEGATIVE Final    Comment: (NOTE) SARS-CoV-2 target nucleic acids are NOT DETECTED. The SARS-CoV-2 RNA is generally detectable in upper and lower respiratory specimens during the acute phase of infection. Negative results do not preclude SARS-CoV-2 infection, do not rule out co-infections with other pathogens, and should not be used as the sole basis for treatment or other patient management decisions. Negative results must be combined with  clinical observations, patient history, and epidemiological information. The expected result is Negative. Fact Sheet for Patients: SugarRoll.be Fact Sheet for Healthcare Providers: https://www.woods-mathews.com/ This test is not yet approved or cleared by the [place] FDA and  has been authorized for detection and/or diagnosis of SARS-CoV-2 by FDA under an Emergency Use Authorization (EUA). This EUA will remain  in effect (meaning this test can be used) for the duration of the COVID-19 declaration under Section 56 4(b)(1) of the Act, 21 U.S.C. section 360bbb-3(b)(1), unless the authorization is terminated or revoked sooner. Performed at [facility], [place] [address], [place], [place] [address]   MRSA PCR Screening     Status: None   Collection Time: 11/27/18  7:31 AM   Specimen: Nasal Mucosa; Nasopharyngeal  Result Value Ref Range Status   MRSA by PCR NEGATIVE NEGATIVE Final    Comment:        The GeneXpert MRSA Assay (FDA approved for NASAL specimens only), is one component of a comprehensive MRSA colonization surveillance program. It is not intended to diagnose MRSA infection nor to guide or monitor treatment for MRSA infections. Performed at [facility], [place] [address], [place], [place] [address]     Radiology Studies: No results found.  Darrell T. [place]  If 7PM-7AM, please contact night-coverage [email or web address] Password [facility] 11/28/2018, 10:59 AM

## 2018-11-29 DIAGNOSIS — N186 End stage renal disease: Secondary | ICD-10-CM | POA: Diagnosis not present

## 2018-11-29 DIAGNOSIS — R109 Unspecified abdominal pain: Secondary | ICD-10-CM | POA: Diagnosis not present

## 2018-11-29 DIAGNOSIS — N200 Calculus of kidney: Secondary | ICD-10-CM | POA: Diagnosis not present

## 2018-11-29 DIAGNOSIS — N132 Hydronephrosis with renal and ureteral calculous obstruction: Secondary | ICD-10-CM | POA: Diagnosis not present

## 2018-11-29 LAB — RENAL FUNCTION PANEL
Albumin: 2.5 g/dL — ABNORMAL LOW (ref 3.5–5.0)
Anion gap: 18 — ABNORMAL HIGH (ref 5–15)
BUN: 93 mg/dL — ABNORMAL HIGH (ref 6–20)
CO2: 23 mmol/L (ref 22–32)
Calcium: 8.6 mg/dL — ABNORMAL LOW (ref 8.9–10.3)
Chloride: 92 mmol/L — ABNORMAL LOW (ref 98–111)
Creatinine, Ser: 13.08 mg/dL — ABNORMAL HIGH (ref 0.61–1.24)
GFR calc Af Amer: 4 mL/min — ABNORMAL LOW (ref >60)
GFR calc non Af Amer: 4 mL/min — ABNORMAL LOW (ref >60)
Glucose, Bld: 140 mg/dL — ABNORMAL HIGH (ref 70–99)
Phosphorus: 2.1 mg/dL — ABNORMAL LOW (ref 2.5–4.6)
Potassium: 4.3 mmol/L (ref 3.5–5.1)
Sodium: 133 mmol/L — ABNORMAL LOW (ref 135–145)

## 2018-11-29 LAB — CBC
HCT: 29.4 % — ABNORMAL LOW (ref 39.0–52.0)
Hemoglobin: 9.4 g/dL — ABNORMAL LOW (ref 13.0–17.0)
MCH: 30.1 pg (ref 26.0–34.0)
MCHC: 32 g/dL (ref 30.0–36.0)
MCV: 94.2 fL (ref 80.0–100.0)
Platelets: 161 10*3/uL (ref 150–400)
RBC: 3.12 MIL/uL — ABNORMAL LOW (ref 4.22–5.81)
RDW: 14.7 % (ref 11.5–15.5)
WBC: 11.7 10*3/uL — ABNORMAL HIGH (ref 4.0–10.5)
nRBC: 0.2 % (ref 0.0–0.2)

## 2018-11-29 LAB — MAGNESIUM: Magnesium: 2.1 mg/dL (ref 1.7–2.4)

## 2018-11-29 MED ORDER — DOXERCALCIFEROL 4 MCG/2ML IV SOLN
INTRAVENOUS | Status: AC
  Administered 2018-11-29: 1 ug via INTRAVENOUS
  Filled 2018-11-29: qty 2

## 2018-11-29 MED ORDER — OXYCODONE HCL 5 MG PO TABS
ORAL_TABLET | ORAL | Status: AC
  Administered 2018-11-29: 5 mg via ORAL
  Filled 2018-11-29: qty 1

## 2018-11-29 MED ORDER — ALTEPLASE 2 MG IJ SOLR
INTRAMUSCULAR | Status: AC
  Administered 2018-11-29: 4 mg
  Filled 2018-11-29: qty 4

## 2018-11-29 MED ORDER — DOXERCALCIFEROL 4 MCG/2ML IV SOLN
1.0000 ug | INTRAVENOUS | Status: DC
  Administered 2018-11-29: 18:00:00 1 ug via INTRAVENOUS

## 2018-11-29 MED ORDER — OXYCODONE-ACETAMINOPHEN 5-325 MG PO TABS
ORAL_TABLET | ORAL | Status: AC
  Administered 2018-11-29: 1 via ORAL
  Filled 2018-11-29: qty 1

## 2018-11-29 NOTE — Progress Notes (Signed)
[facility] Progress Note   Subjective:   Patient seen in room. Frustrated, says he has been waiting for his pain meds. Requesting something to help him sleep during dialysis. Denies shortness of breath, cough, CP, palpitations, dizziness, nausea, and diarrhea.   Objective Vitals:   11/29/18 0814 11/29/18 1352 11/29/18 1400 11/29/18 1430  BP: 117/62 (!) 132/59 116/63 111/63  Pulse: 77 78 80 74  Resp: 18 16    Temp: 98.8 F (37.1 C)     TempSrc: Oral Oral    SpO2: 96% 96%    Weight:  122 kg    Height:       Physical Exam General: Well developed male, alert, appears uncomfortable Heart: RRR, + blowing systolic murmur, no rubs or gallops Lungs: CTA bilaterally without wheezing, rhonchi or rales Abdomen: Soft, non-tender, non-distended. + L perc nephrostomy tube Extremities: No edema b/l lower extremities Dialysis Access: R femoral catheter  Additional Objective Labs: Basic Metabolic Panel: Recent Labs  Lab 11/27/18 0610 11/28/18 0031 11/29/18 1210  NA 132* 132* 133*  K 4.9 3.8 4.3  CL 92* 92* 92*  CO2 24 22 23   GLUCOSE 131* 108* 140*  BUN 50* 63* 93*  CREATININE 10.23* 11.10* 13.08*  CALCIUM 9.1 9.1 8.6*  PHOS  --  2.7 2.1*   Liver Function Tests: Recent Labs  Lab 11/26/18 0138 11/27/18 0610 11/28/18 0031 11/29/18 1210  AST 15 26  --   --   ALT 13 20  --   --   ALKPHOS 84 137*  --   --   BILITOT 1.1 2.1*  --   --   PROT 9.1* 8.2*  --   --   ALBUMIN 3.2* 2.8* 2.7* 2.5*   Recent Labs  Lab 11/26/18 0138  LIPASE 23   CBC: Recent Labs  Lab 11/26/18 0138 11/26/18 0600 11/28/18 0031 11/29/18 1210  WBC 12.1* 13.5* 10.7* 11.7*  NEUTROABS 11.1*  --   --   --   HGB 10.6* 10.4* 10.1* 9.4*  HCT 33.6* 33.4* 32.0* 29.4*  MCV 97.7 97.4 95.5 94.2  PLT 190 213 145* 161   Blood Culture    Component Value Date/Time   SDES ABSCESS LEFT NEPHROSTOMY PLACEMENT 11/28/2018 1040   SPECREQUEST Normal 11/28/2018 1040   CULT  11/28/2018 1040    NO  GROWTH < 24 HOURS Performed at [facility], [address], [place], [place] [address]    REPTSTATUS PENDING 11/28/2018 1040    Cardiac Enzymes: No results for input(s): CKTOTAL, CKMB, CKMBINDEX, TROPONINI in the last 168 hours. CBG: No results for input(s): GLUCAP in the last 168 hours. Iron Studies: No results for input(s): IRON, TIBC, TRANSFERRIN, FERRITIN in the last 72 hours. @lablastinr3 @ Studies/Results: Ir Nephrostomy Placement Left  Result Date: 11/28/2018 CLINICAL DATA:  Left hydroureteronephrosis, ureteral calculus EXAM: LEFT PERCUTANEOUS NEPHROSTOMY CATHETER PLACEMENT UNDER ULTRASOUND AND FLUOROSCOPIC GUIDANCE FLUOROSCOPY TIME:  5.5 minute; 1302  uGym2 DAP TECHNIQUE: The procedure, risks (including but not limited to bleeding, infection, organ damage ), benefits, and alternatives were explained to the patient. Questions regarding the procedure were encouraged and answered. The patient understands and consents to the procedure. The leftflank region prepped with chlorhexidine, draped in usual sterile fashion, infiltrated locally with 1% lidocaine.Patient was receiving adequate prophylactic antibiotic coverage. Intravenous Fentanyl 150mcg and Versed 7mg  were administered as conscious sedation during continuous monitoring of the patient's level of consciousness and physiological / cardiorespiratory status by the radiology RN, with a total moderate sedation time  of 51 minutes. Under real-time ultrasound guidance, a 21-gauge needle was advanced into a posterior lower pole calyx. Ultrasound image documentation was saved. Bloody material could be aspirated. Contrast injection opacified central renal collecting system and proximal ureter. Needle was exchanged over a guidewire for transitional dilator. 20 mL bloody fluid were aspirated from the renal collecting system. Contrast injection confirmed appropriate positioning. Catheter was exchanged over a guidewire for a 10 French pigtail  catheter, formed centrally within the left renal collecting system. Contrast injection confirms appropriate positioning and patency. Catheter secured externally with 0 Prolene suture and StatLock, and placed to external drain bag. COMPLICATIONS: COMPLICATIONS none IMPRESSION: 1. Technically successful left percutaneous nephrostomy catheter placement. Electronically Signed   By: Lucrezia Europe M.D.   On: 11/28/2018 12:06   Medications: . cefTRIAXone (ROCEPHIN)  IV 1 g (11/28/18 1744)   . calcium acetate  1,334 mg Oral TID WC  . Chlorhexidine Gluconate Cloth  6 each Topical Daily  . Chlorhexidine Gluconate Cloth  6 each Topical Q0600  . famotidine  20 mg Oral Q1400  . feeding supplement (NEPRO CARB STEADY)  237 mL Oral BID BM  . levothyroxine  125 mcg Oral QAC breakfast  . LORazepam  1 mg Oral Daily  . sodium chloride flush  5 mL Intracatheter Q8H  . [START ON 11/30/2018] sodium polystyrene  15 g Oral Once per day on Sun Sat  . traZODone  100 mg Oral QHS    Dialysis Orders: Abelina Bachelor mwf. TQ:069705 kg ( leaving below edw ) 4hr 62minHD Bath 2k 2.25caHeparin 3,700. AccessR fem pcBFR 350DFR 800  Hec 20mcg IV/HD  Mircera 75 mcg ( last on 11/24/18 ) was on q 4 wks )Units IV/HD  Venofer 100mg  x 10 start 11/25- 12/10 /20  Assessment/Plan: 1. L ureteral calculi: With moderate hydronephrosis and calyceal rupture. In significant pain. S/p L percutaneous nephrostromy tube placement by IR on 11/26. On rocephin. Urology following.  2. ESRD: MWF, last HD 11/24 off schedule due to holiday, resume regular schedule tomorrow. Last treatment cut short due to pain but not volume overloaded today. K+ 3.8. Not adding any extra sedating meds with HD as patient is on multiple sedating medications already.  3. HTN/volume:  BP controlled. not volume overloaded. Below outpatient EDW. Uses midodrine outpatient but BP ok here. 4. Anemia: Hgb 9.4. Not due for ESA yet. Will hold IV Fe while on IV  antibiotics, resume once antibiotics are completed. 5. Secondary hyperparathyroidism:  Corrected calcium 9.8. Will resume hectorol at lower dose of 1 mcg. Phos 2.7 yesterday, calcium acetate decreased but phos now 2.1, will d/c calcium acetate. .  6. Nutrition:  Renal diet with fluid restrictions.   Anice Paganini, PA-C 11/29/2018, 3:11 PM  [employer] Pager: [phone]

## 2018-11-29 NOTE — Progress Notes (Signed)
PT Cancellation Note  Patient Details Name: [name] MRN: [id number] DOB: [birth year]   Cancelled Treatment:    Reason Eval/Treat Not Completed: Patient at procedure or test/unavailable.  Will try again at another time.    Ramond Dial 11/29/2018, 2:29 PM    Mee Hives, PT MS Acute Rehab Dept. Number: [place] [facility] [place]

## 2018-11-29 NOTE — Progress Notes (Signed)
Chief Complaint: Patient was seen today for (L)PCN   Supervising Physician: Aletta Edouard  Patient Status: [facility] - In-pt  Subjective: S/p (L)PCN yesterday for chronic pain and leukocytosis with some hyrdo Pt still reports continued pain, no change yet  Objective: Physical Exam: BP 117/62 (BP Location: Right Arm)    Pulse 77    Temp 98.8 F (37.1 C) (Oral)    Resp 18    Ht 5\' 11"  (1.803 m)    Wt 119.4 kg    SpO2 96%    BMI 36.71 kg/m  (L)PCN intact, site clean, mildly tender. Output thin dark bloody. No clots   Current Facility-Administered Medications:    acetaminophen (TYLENOL) tablet 650 mg, 650 mg, Oral, Q6H PRN, 650 mg at 11/26/18 0920 **OR** acetaminophen (TYLENOL) suppository 650 mg, 650 mg, Rectal, Q6H PRN, Jani Gravel, MD   aspirin EC tablet 81 mg, 81 mg, Oral, Daily, Cyndia Skeeters, Taye T, MD, 81 mg at 11/28/18 0804   calcium acetate (PHOSLO) capsule 1,334 mg, 1,334 mg, Oral, TID WC, Collins, Samantha G, PA-C   cefTRIAXone (ROCEPHIN) 1 g in sodium chloride 0.9 % 100 mL IVPB, 1 g, Intravenous, Q24H, Jani Gravel, MD, Last Rate: 200 mL/hr at 11/28/18 1744, 1 g at 11/28/18 1744   Chlorhexidine Gluconate Cloth 2 % PADS 6 each, 6 each, Topical, Daily, Barton Dubois, MD, 6 each at 11/28/18 0805   Chlorhexidine Gluconate Cloth 2 % PADS 6 each, 6 each, Topical, Q0600, Collins, Samantha G, PA-C   clopidogrel (PLAVIX) tablet 75 mg, 75 mg, Oral, Daily, Gonfa, Taye T, MD   famotidine (PEPCID) tablet 20 mg, 20 mg, Oral, Q1400, Barton Dubois, MD, 20 mg at 11/28/18 1520   feeding supplement (NEPRO CARB STEADY) liquid 237 mL, 237 mL, Oral, BID BM, Barton Dubois, MD, Stopped at 11/28/18 1537   fluticasone (FLONASE) 50 MCG/ACT nasal spray 2 spray, 2 spray, Each Nare, PRN, Jani Gravel, MD   heparin injection 5,800 Units, 5,800 Units, Intravenous, Q dialysis, Elmarie Shiley, MD   HYDROmorphone (DILAUDID) injection 1 mg, 1 mg, Intravenous, Q4H PRN, Cyndia Skeeters, Taye T, MD, 1 mg at 11/28/18  2106   levothyroxine (SYNTHROID) tablet 125 mcg, 125 mcg, Oral, QAC breakfast, Jani Gravel, MD, 125 mcg at 11/28/18 0803   LORazepam (ATIVAN) tablet 1 mg, 1 mg, Oral, Daily, Jani Gravel, MD, 1 mg at 11/28/18 0804   ondansetron [facility]) injection 4 mg, 4 mg, Intravenous, Q6H PRN, Barton Dubois, MD, 4 mg at 11/28/18 1520   oxyCODONE-acetaminophen (PERCOCET/ROXICET) 5-325 MG per tablet 1 tablet, 1 tablet, Oral, Q6H PRN, 1 tablet at 11/29/18 0522 **AND** oxyCODONE (Oxy IR/ROXICODONE) immediate release tablet 5 mg, 5 mg, Oral, Q6H PRN, Barton Dubois, MD, 5 mg at 11/29/18 0523   sodium chloride flush (NS) 0.9 % injection 5 mL, 5 mL, Intracatheter, Q8H, Arne Cleveland, MD, 5 mL at 11/28/18 2102   [START ON 11/30/2018] sodium polystyrene (KAYEXALATE) powder 15 g, 15 g, Oral, Once per day on Sun Sat, Kim, James, MD   traZODone (DESYREL) tablet 100 mg, 100 mg, Oral, QHS, Jani Gravel, MD, 100 mg at 11/28/18 2231   zolpidem (AMBIEN) tablet 10 mg, 10 mg, Oral, QHS PRN, Wendee Beavers T, MD, 10 mg at 11/28/18 2231  Labs: CBC Recent Labs    11/28/18 0031  WBC 10.7*  HGB 10.1*  HCT 32.0*  PLT 145*   BMET Recent Labs    11/27/18 0610 11/28/18 0031  NA 132* 132*  K 4.9 3.8  CL 92* 92*  CO2 24 22  GLUCOSE 131* 108*  BUN 50* 63*  CREATININE 10.23* 11.10*  CALCIUM 9.1 9.1   LFT Recent Labs    11/27/18 0610 11/28/18 0031  PROT 8.2*  --   ALBUMIN 2.8* 2.7*  AST 26  --   ALT 20  --   ALKPHOS 137*  --   BILITOT 2.1*  --    PT/INR No results for input(s): LABPROT, INR in the last 72 hours.   Studies/Results: Ir Nephrostomy Placement Left  Result Date: 11/28/2018 CLINICAL DATA:  Left hydroureteronephrosis, ureteral calculus EXAM: LEFT PERCUTANEOUS NEPHROSTOMY CATHETER PLACEMENT UNDER ULTRASOUND AND FLUOROSCOPIC GUIDANCE FLUOROSCOPY TIME:  5.5 minute; 1302  uGym2 DAP TECHNIQUE: The procedure, risks (including but not limited to bleeding, infection, organ damage ), benefits, and  alternatives were explained to the patient. Questions regarding the procedure were encouraged and answered. The patient understands and consents to the procedure. The leftflank region prepped with chlorhexidine, draped in usual sterile fashion, infiltrated locally with 1% lidocaine.Patient was receiving adequate prophylactic antibiotic coverage. Intravenous Fentanyl 122mcg and Versed 7mg  were administered as conscious sedation during continuous monitoring of the patient's level of consciousness and physiological / cardiorespiratory status by the radiology RN, with a total moderate sedation time of 51 minutes. Under real-time ultrasound guidance, a 21-gauge needle was advanced into a posterior lower pole calyx. Ultrasound image documentation was saved. Bloody material could be aspirated. Contrast injection opacified central renal collecting system and proximal ureter. Needle was exchanged over a guidewire for transitional dilator. 20 mL bloody fluid were aspirated from the renal collecting system. Contrast injection confirmed appropriate positioning. Catheter was exchanged over a guidewire for a 10 French pigtail catheter, formed centrally within the left renal collecting system. Contrast injection confirms appropriate positioning and patency. Catheter secured externally with 0 Prolene suture and StatLock, and placed to external drain bag. COMPLICATIONS: COMPLICATIONS none IMPRESSION: 1. Technically successful left percutaneous nephrostomy catheter placement. Electronically Signed   By: Lucrezia Europe M.D.   On: 11/28/2018 12:06    Assessment/Plan: S/p (L)PCN yesterday. No change in pain so far. WBC down a bit to 10.7 Output bloody but pt on Plavix. IR following but need Urology input for longer term plan    LOS: 3 days   I spent a total of 15 minutes in face to face in clinical consultation, greater than 50% of which was counseling/coordinating care for (L)PCN  Ascencion Dike PA-C 11/29/2018 9:22 AM

## 2018-11-29 NOTE — Progress Notes (Signed)
PROGRESS NOTE  [name] [id number] DOB: 18-Oct-1965   PCP: [name], MD  Patient is from: Home  DOA: 11/25/2018 LOS: 3  Brief Narrative / Interim history: 53 year old male with history of ESRD on HD MWF, spinal bifid, neurogenic bladder/ileal conduit, seizures, migraine headache, HTN/HoTN, kidney stones, GERD and anxiety presenting with left flank pain on 11/25/2018 and admitted for left moderate hydronephrosis, calyceal rupture, nonobstructing bilateral nephrolithiasis and developing pyelonephritis as noted on CT. Nephrology and urology consulted.  Urology recommended left percutaneous nephrostomy by IR, and outpatient follow-up.  Seen by IR 11/26/2018.  Per IR note, declined percutaneous nephrostomy, asking for nephrectomy. However, he later agreed to nephrostomy tube placement.  IR reconsulted 11/27/2018.  Underwent left nephrostomy tube placement on 11/28/2018.  Subjective: Continues to endorse significant left flank pain.  Reports very little improvement with Dilaudid after nephrostomy tube placement. He continues to endorse severe left flank pain.  He is anxious about going home although no one has even mentioned about discharging him home.  He says his sister is out of town and will not be back until Monday.  He says he would like to stay in the hospital until then. He also mentioned about appealing to Medicare if he is discharged before that.  Objective: Vitals:   11/28/18 1535 11/28/18 2021 11/29/18 0417 11/29/18 0814  BP: 126/75 117/69 116/80 117/62  Pulse: 73 81 81 77  Resp:  16 16 18   Temp: 98.9 F (37.2 C) 98.1 F (36.7 C) 98.8 F (37.1 C) 98.8 F (37.1 C)  TempSrc: Oral Oral Oral Oral  SpO2: 95% 97% 95% 96%  Weight:   119.4 kg   Height:        Intake/Output Summary (Last 24 hours) at 11/29/2018 1031 Last data filed at 11/29/2018 0800 Gross per 24 hour  Intake 1085 ml  Output 0 ml  Net 1085 ml   Filed Weights   11/27/18 0010 11/27/18 0322  11/29/18 0417  Weight: 119.6 kg 118.9 kg 119.4 kg    Examination:  GENERAL: No acute distress.  Lying in bed. HEENT: MMM.  Vision and hearing grossly intact.  NECK: Supple.  No apparent JVD.  RESP:  No IWOB. Good air movement bilaterally. CVS:  RRR. Heart sounds normal.  ABD/GI/GU: Bowel sounds present. Soft. Non tender.  RLQ urostomy.  Nephrostomy tube over left lower back with bloody output in nephrostomy bag. MSK/EXT:  Moves extremities. No apparent deformity or edema.  SKIN: Old abdominal surgical scar from previous surgery NEURO: Awake, alert and oriented appropriately.  No apparent focal neuro deficit. PSYCH: Calm.  Somewhat flat affect.  Assessment & Plan: Left flank pain/left moderate hydronephrosis/possible left calyceal rupture/nonobstructing bilateral nephrolithiasis/developing left pyelonephritis -Urology consulted and recommended PCN by IR -Percutaneous nephrostomy tube placed by IR on 11/28/2018-bloody output. -Nephrostomy output culture negative so far. -Continue IV ceftriaxone 11/23>> -continues to severe left flank pain although their is no autonomic response to suggest this.  Anxious about going home.   Anemia of chronic disease: has bloody output in nephrostomy bag.  -Hold Plavix and aspirin -Check H&H-he has not had lab draw this morning.  ESRD on HD MWF Bone mineral disorder -Nephrology following.  Essential hypertension/hypotension: Normotensive -Continue midodrine  Anxiety/insomnia -Continue home Ativan, trazodone and Ambien -He is very anxious about going home.  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  History of peripheral arterial disease -Continue home  Plavix and aspirin  Thrombocytopenia: Platelet 145 (213 two days ago).  He is low probability for HIT. -Recheck in the morning               DVT  prophylaxis: SCD Code Status: Full code Family Communication: Patient and/or RN. Available if any question. Disposition Plan: Remains inpatient.  Continues to report severe left flank pain and has bloody output in nephrostomy bag. Consultants: Urology, IR and nephrology  Procedures:  11/28/2018-percutaneous nephrostomy tube placement  Microbiology summarized: [id number] screen negative MRSA PCR negative Nephrostomy drain culture negative so far.  Sch Meds:  Scheduled Meds:  calcium acetate  1,334 mg Oral TID WC   Chlorhexidine Gluconate Cloth  6 each Topical Daily   Chlorhexidine Gluconate Cloth  6 each Topical Q0600   famotidine  20 mg Oral Q1400   feeding supplement (NEPRO CARB STEADY)  237 mL Oral BID BM   levothyroxine  125 mcg Oral QAC breakfast   LORazepam  1 mg Oral Daily   sodium chloride flush  5 mL Intracatheter Q8H   [START ON 11/30/2018] sodium polystyrene  15 g Oral Once per day on Sun Sat   traZODone  100 mg Oral QHS   Continuous Infusions:  cefTRIAXone (ROCEPHIN)  IV 1 g (11/28/18 1744)   PRN Meds:.acetaminophen **OR** acetaminophen, fluticasone, heparin, HYDROmorphone (DILAUDID) injection, ondansetron (ZOFRAN) IV, oxyCODONE-acetaminophen **AND** oxyCODONE, zolpidem  Antimicrobials: Anti-infectives (From admission, onward)   Start     Dose/Rate Route Frequency Ordered Stop   11/26/18 1700  cefTRIAXone (ROCEPHIN) 1 g in sodium chloride 0.9 % 100 mL IVPB     1 g 200 mL/hr over 30 Minutes Intravenous Every 24 hours 11/26/18 1543         I have personally reviewed the following labs and images: CBC: Recent Labs  Lab 11/26/18 0138 11/26/18 0600 11/28/18 0031  WBC 12.1* 13.5* 10.7*  NEUTROABS 11.1*  --   --   HGB 10.6* 10.4* 10.1*  HCT 33.6* 33.4* 32.0*  MCV 97.7 97.4 95.5  PLT 190 213 145*   BMP &GFR Recent Labs  Lab 11/26/18 0138 11/26/18 0600 11/27/18 0610 11/28/18 0031  NA 135  --  132* 132*  K 4.3  --  4.9 3.8  CL 94*  --  92*  92*  CO2 22  --  24 22  GLUCOSE 129*  --  131* 108*  BUN 48*  --  50* 63*  CREATININE 10.46* 10.63* 10.23* 11.10*  CALCIUM 9.1  --  9.1 9.1  MG  --   --   --  1.9  PHOS  --   --   --  2.7   Estimated Creatinine Clearance: 10.1 mL/min (A) (by C-G formula based on SCr of 11.1 mg/dL (H)). Liver & Pancreas: Recent Labs  Lab 11/26/18 0138 11/27/18 0610 11/28/18 0031  AST 15 26  --   ALT 13 20  --   ALKPHOS 84 137*  --   BILITOT 1.1 2.1*  --   PROT 9.1* 8.2*  --   ALBUMIN 3.2* 2.8* 2.7*   Recent Labs  Lab 11/26/18 0138  LIPASE 23   No results for input(s): AMMONIA in the last 168 hours. Diabetic: No results for input(s): HGBA1C in the last 72 hours. No results for input(s): GLUCAP in the last 168 hours. Cardiac Enzymes: No results for input(s): CKTOTAL, CKMB, CKMBINDEX, TROPONINI in the last 168 hours. No results for input(s): PROBNP in the last 8760 hours. Coagulation Profile: Recent Labs  Lab 11/26/18 0815  INR 1.2   Thyroid Function Tests: No results for input(s): TSH, T4TOTAL, FREET4, T3FREE, THYROIDAB in the last 72 hours. Lipid Profile: No results for input(s): CHOL, HDL, LDLCALC, TRIG, CHOLHDL, LDLDIRECT in the last 72 hours. Anemia Panel: No results for input(s): VITAMINB12, FOLATE, FERRITIN, TIBC, IRON, RETICCTPCT in the last 72 hours. Urine analysis:    Component Value Date/Time   COLORURINE RED (A) 07/31/2010 0410   APPEARANCEUR TURBID (A) 07/31/2010 0410   LABSPEC 1.044 (H) 07/31/2010 0410   PHURINE 6.5 07/31/2010 0410   GLUCOSEU 100 (A) 07/31/2010 0410   HGBUR LARGE (A) 07/31/2010 0410   BILIRUBINUR LARGE (A) 07/31/2010 0410   KETONESUR 40 (A) 07/31/2010 0410   PROTEINUR >300 (A) 07/31/2010 0410   UROBILINOGEN 4.0 (H) 07/31/2010 0410   NITRITE POSITIVE (A) 07/31/2010 0410   LEUKOCYTESUR LARGE (A) 07/31/2010 0410   Sepsis Labs: Invalid input(s): PROCALCITONIN, [place]  Microbiology: Recent Results (from the past 240 hour(s))  SARS  CORONAVIRUS 2 (TAT 6-24 HRS) Nasopharyngeal Nasopharyngeal Swab     Status: None   Collection Time: 11/26/18  1:37 PM   Specimen: Nasopharyngeal Swab  Result Value Ref Range Status   SARS Coronavirus 2 NEGATIVE NEGATIVE Final    Comment: (NOTE) SARS-CoV-2 target nucleic acids are NOT DETECTED. The SARS-CoV-2 RNA is generally detectable in upper and lower respiratory specimens during the acute phase of infection. Negative results do not preclude SARS-CoV-2 infection, do not rule out co-infections with other pathogens, and should not be used as the sole basis for treatment or other patient management decisions. Negative results must be combined with clinical observations, patient history, and epidemiological information. The expected result is Negative. Fact Sheet for Patients: SugarRoll.be Fact Sheet for Healthcare Providers: https://www.woods-mathews.com/ This test is not yet approved or cleared by the [place] FDA and  has been authorized for detection and/or diagnosis of SARS-CoV-2 by FDA under an Emergency Use Authorization (EUA). This EUA will remain  in effect (meaning this test can be used) for the duration of the COVID-19 declaration under Section 56 4(b)(1) of the Act, 21 U.S.C. section 360bbb-3(b)(1), unless the authorization is terminated or revoked sooner. Performed at [facility], [place] [address] [place], [place] [address]   MRSA PCR Screening     Status: None   Collection Time: 11/27/18  7:31 AM   Specimen: Nasal Mucosa; Nasopharyngeal  Result Value Ref Range Status   MRSA by PCR NEGATIVE NEGATIVE Final    Comment:        The GeneXpert MRSA Assay (FDA approved for NASAL specimens only), is one component of a comprehensive MRSA colonization surveillance program. It is not intended to diagnose MRSA infection nor to guide or monitor treatment for MRSA infections. Performed at [facility], [place]  [address], [place], [place] [address]   Aerobic/Anaerobic Culture (surgical/deep wound)     Status: None (Preliminary result)   Collection Time: 11/28/18 10:40 AM   Specimen: Abscess; Urine  Result Value Ref Range Status   Specimen Description ABSCESS LEFT NEPHROSTOMY PLACEMENT  Final   Special Requests Normal  Final   Gram Stain   Final    ABUNDANT WBC PRESENT,BOTH PMN AND MONONUCLEAR NO ORGANISMS SEEN    Culture   Final    NO GROWTH < 24 HOURS Performed at [facility], [place] [address], [place],  [address]    Report Status PENDING  Incomplete    Radiology Studies: Ir Nephrostomy Placement Left  Result Date: 11/28/2018 CLINICAL DATA:  Left hydroureteronephrosis, ureteral calculus EXAM: LEFT PERCUTANEOUS NEPHROSTOMY CATHETER PLACEMENT UNDER ULTRASOUND AND FLUOROSCOPIC GUIDANCE FLUOROSCOPY TIME:  5.5 minute; 1302  uGym2 DAP TECHNIQUE: The procedure, risks (including but not limited to bleeding, infection, organ damage ), benefits, and alternatives were explained to the patient. Questions regarding the procedure were encouraged and answered. The patient understands and consents to the procedure. The leftflank region prepped with chlorhexidine, draped in usual sterile fashion, infiltrated locally with 1% lidocaine.Patient was receiving adequate prophylactic antibiotic coverage. Intravenous Fentanyl 178mcg and Versed 7mg  were administered as conscious sedation during continuous monitoring of the patient's level of consciousness and physiological / cardiorespiratory status by the radiology RN, with a total moderate sedation time of 51 minutes. Under real-time ultrasound guidance, a 21-gauge needle was advanced into a posterior lower pole calyx. Ultrasound image documentation was saved. Bloody material could be aspirated. Contrast injection opacified central renal collecting system and proximal ureter. Needle was exchanged over a guidewire for transitional dilator. 20 mL bloody fluid were  aspirated from the renal collecting system. Contrast injection confirmed appropriate positioning. Catheter was exchanged over a guidewire for a 10 French pigtail catheter, formed centrally within the left renal collecting system. Contrast injection confirms appropriate positioning and patency. Catheter secured externally with 0 Prolene suture and StatLock, and placed to external drain bag. COMPLICATIONS: COMPLICATIONS none IMPRESSION: 1. Technically successful left percutaneous nephrostomy catheter placement. Electronically Signed   By: Lucrezia Europe M.D.   On: 11/28/2018 12:06    Lasonya Hubner T. Atkins  If 7PM-7AM, please contact night-coverage [email or web address] Password [facility] 11/29/2018, 10:31 AM

## 2018-11-30 ENCOUNTER — Inpatient Hospital Stay (HOSPITAL_COMMUNITY): Payer: Medicare Other

## 2018-11-30 DIAGNOSIS — N132 Hydronephrosis with renal and ureteral calculous obstruction: Secondary | ICD-10-CM | POA: Diagnosis not present

## 2018-11-30 DIAGNOSIS — N200 Calculus of kidney: Secondary | ICD-10-CM | POA: Diagnosis not present

## 2018-11-30 DIAGNOSIS — R109 Unspecified abdominal pain: Secondary | ICD-10-CM | POA: Diagnosis not present

## 2018-11-30 DIAGNOSIS — N186 End stage renal disease: Secondary | ICD-10-CM | POA: Diagnosis not present

## 2018-11-30 LAB — RENAL FUNCTION PANEL
Albumin: 2.4 g/dL — ABNORMAL LOW (ref 3.5–5.0)
Anion gap: 15 (ref 5–15)
BUN: 65 mg/dL — ABNORMAL HIGH (ref 6–20)
CO2: 25 mmol/L (ref 22–32)
Calcium: 8.4 mg/dL — ABNORMAL LOW (ref 8.9–10.3)
Chloride: 95 mmol/L — ABNORMAL LOW (ref 98–111)
Creatinine, Ser: 9.39 mg/dL — ABNORMAL HIGH (ref 0.61–1.24)
GFR calc Af Amer: 7 mL/min — ABNORMAL LOW (ref >60)
GFR calc non Af Amer: 6 mL/min — ABNORMAL LOW (ref >60)
Glucose, Bld: 108 mg/dL — ABNORMAL HIGH (ref 70–99)
Phosphorus: 2.4 mg/dL — ABNORMAL LOW (ref 2.5–4.6)
Potassium: 4.6 mmol/L (ref 3.5–5.1)
Sodium: 135 mmol/L (ref 135–145)

## 2018-11-30 LAB — CBC
HCT: 27.6 % — ABNORMAL LOW (ref 39.0–52.0)
Hemoglobin: 8.7 g/dL — ABNORMAL LOW (ref 13.0–17.0)
MCH: 30.5 pg (ref 26.0–34.0)
MCHC: 31.5 g/dL (ref 30.0–36.0)
MCV: 96.8 fL (ref 80.0–100.0)
Platelets: 147 10*3/uL — ABNORMAL LOW (ref 150–400)
RBC: 2.85 MIL/uL — ABNORMAL LOW (ref 4.22–5.81)
RDW: 14.7 % (ref 11.5–15.5)
WBC: 9.5 10*3/uL (ref 4.0–10.5)
nRBC: 0.2 % (ref 0.0–0.2)

## 2018-11-30 LAB — MAGNESIUM: Magnesium: 2 mg/dL (ref 1.7–2.4)

## 2018-11-30 NOTE — Progress Notes (Addendum)
[facility] Progress Note   Subjective:   Patient seen in room. Complains of pain, insomnia and diet. Reports pain has not improved at all since admission. Denies SOB, orthopnea, cough, CP, palpitations, N/V. He did tolerate his full HD session yesterday. Reports he might want to try dialysis in a chair for comfort, will decide tomorrow.   Objective Vitals:   11/29/18 1806 11/29/18 2029 11/30/18 0500 11/30/18 1018  BP: 120/71 117/66 (!) 98/31 (!) 119/56  Pulse: 74 82 76 75  Resp: 16 16 18 18   Temp: 99.6 F (37.6 C) 98.8 F (37.1 C) 99.3 F (37.4 C) 98.8 F (37.1 C)  TempSrc: Oral Oral Oral Oral  SpO2: 96% 96% 96% 98%  Weight: 120.5 kg  120.6 kg   Height:       Physical Exam General:Well developed male, alert, sitting on bedside.  Heart:RRR, + blowing systolic murmur, no rubs or gallops Lungs:CTA bilaterally without wheezing, rhonchi or rales Abdomen:Soft, non-tender, non-distended. + L perc nephrostomy tube, bloody output in bag Extremities:No edema b/l lower extremities Dialysis Access:R femoral catheter  Additional Objective Labs: Basic Metabolic Panel: Recent Labs  Lab 11/28/18 0031 11/29/18 1210 11/30/18 0453  NA 132* 133* 135  K 3.8 4.3 4.6  CL 92* 92* 95*  CO2 22 23 25   GLUCOSE 108* 140* 108*  BUN 63* 93* 65*  CREATININE 11.10* 13.08* 9.39*  CALCIUM 9.1 8.6* 8.4*  PHOS 2.7 2.1* 2.4*   Liver Function Tests: Recent Labs  Lab 11/26/18 0138 11/27/18 0610 11/28/18 0031 11/29/18 1210 11/30/18 0453  AST 15 26  --   --   --   ALT 13 20  --   --   --   ALKPHOS 84 137*  --   --   --   BILITOT 1.1 2.1*  --   --   --   PROT 9.1* 8.2*  --   --   --   ALBUMIN 3.2* 2.8* 2.7* 2.5* 2.4*   Recent Labs  Lab 11/26/18 0138  LIPASE 23   CBC: Recent Labs  Lab 11/26/18 0138 11/26/18 0600 11/28/18 0031 11/29/18 1210 11/30/18 0453  WBC 12.1* 13.5* 10.7* 11.7* 9.5  NEUTROABS 11.1*  --   --   --   --   HGB 10.6* 10.4* 10.1* 9.4* 8.7*  HCT  33.6* 33.4* 32.0* 29.4* 27.6*  MCV 97.7 97.4 95.5 94.2 96.8  PLT 190 213 145* 161 147*   Blood Culture    Component Value Date/Time   SDES ABSCESS LEFT NEPHROSTOMY PLACEMENT 11/28/2018 1040   SPECREQUEST Normal 11/28/2018 1040   CULT  11/28/2018 1040    NO GROWTH 2 DAYS Performed at [facility] [address], [place],  [address]    REPTSTATUS PENDING 11/28/2018 1040    Medications: . cefTRIAXone (ROCEPHIN)  IV 1 g (11/29/18 2038)   . Chlorhexidine Gluconate Cloth  6 each Topical Daily  . Chlorhexidine Gluconate Cloth  6 each Topical Q0600  . [START ON 12/02/2018] doxercalciferol  1 mcg Intravenous Q M,W,F-HD  . famotidine  20 mg Oral Q1400  . feeding supplement (NEPRO CARB STEADY)  237 mL Oral BID BM  . levothyroxine  125 mcg Oral QAC breakfast  . LORazepam  1 mg Oral Daily  . sodium chloride flush  5 mL Intracatheter Q8H  . sodium polystyrene  15 g Oral Once per day on Sun Sat  . traZODone  100 mg Oral QHS    Dialysis Orders: Abelina Bachelor mwf.  TQ:[id number] kg ( leaving below edw ) 4hr 66minHD Bath 2k 2.25caHeparin 3,700. AccessR fem pcBFR 350DFR 800  Hec 80mcg IV/HD  Mircera 75 mcg ( last on 11/24/18 ) was on q 4 wks )Units IV/HD  Venofer 100mg  x 10 start 11/25- 12/10 /20  Assessment/Plan: 1.L ureteral calculi:With moderate hydronephrosis and calyceal rupture. In significant pain. S/p L percutaneous nephrostromy tube placement by IR on 11/26. On rocephin. Bloody output in bag, primary consulting urology. Pain/insomnia issue discussed with primary.  2. ESRD:MWF. Tolerated HD yesterday. K+ 4.6. Not adding any extra sedating meds with HD as patient is on multiple sedating medications already.  3.HTN/volume:BP controlled. not volume overloaded. Below outpatient EDW. Uses midodrine outpatient but BP ok here. 4. Anemia:Hgb 9.4. Not due for ESA yet. Will hold IV Fe while on IV antibiotics, resume once antibiotics are completed. 5. Secondary  hyperparathyroidism:Corrected calcium 9.7. Continue hectorol at lower dose of 1 mcg. Phos 2.7 , calcium acetate decreased but phos declined further to 2.1 so d/c calcium acetate on 11/27  Phos now 2.4, follow.. 6. Nutrition:Renal diet with fluid restrictions.   Anice Paganini, PA-C 11/30/2018, 12:12 PM  [facility] Pager: [phone]  Pt seen, examined and agree w A/P as above.  [name]  MD 11/30/2018, 5:11 PM

## 2018-11-30 NOTE — Progress Notes (Signed)
PROGRESS NOTE  [name] [id number] DOB: 1965-08-18   PCP: [name], MD  Patient is from: Home  DOA: 11/25/2018 LOS: 4  Brief Narrative / Interim history: 53 year old male with history of ESRD on HD MWF, spinal bifid, neurogenic bladder/ileal conduit, seizures, migraine headache, HTN/HoTN, kidney stones, GERD and anxiety presenting with left flank pain on 11/25/2018 and admitted for left moderate hydronephrosis, calyceal rupture, nonobstructing bilateral nephrolithiasis and developing pyelonephritis as noted on CT. Nephrology and urology consulted.  Urology recommended left percutaneous nephrostomy by IR, and outpatient follow-up.  Seen by IR 11/26/2018.  Per IR note, declined percutaneous nephrostomy, asking for nephrectomy. However, he later agreed to nephrostomy tube placement.  IR reconsulted 11/27/2018.  Underwent left nephrostomy tube placement on 11/28/2018.  Subjective: Patient continues to endorse significant left flank pain and inability to sleep.  He asks for "something" that would "knock him out".  He denies chest pain or fever.  Has about 300 cc dark red urine from nephrostomy in the last 24 hours.  Objective: Vitals:   11/29/18 1806 11/29/18 2029 11/30/18 0500 11/30/18 1018  BP: 120/71 117/66 (!) 98/31 (!) 119/56  Pulse: 74 82 76 75  Resp: 16 16 18 18   Temp: 99.6 F (37.6 C) 98.8 F (37.1 C) 99.3 F (37.4 C) 98.8 F (37.1 C)  TempSrc: Oral Oral Oral Oral  SpO2: 96% 96% 96% 98%  Weight: 120.5 kg  120.6 kg   Height:        Intake/Output Summary (Last 24 hours) at 11/30/2018 1148 Last data filed at 11/30/2018 0704 Gross per 24 hour  Intake 745 ml  Output 1942 ml  Net -1197 ml   Filed Weights   11/29/18 1352 11/29/18 1806 11/30/18 0500  Weight: 122 kg 120.5 kg 120.6 kg    Examination:  GENERAL: No acute distress.  Appears well.  HEENT: MMM.  Vision and hearing grossly intact.  NECK: Supple.  No apparent JVD.  RESP:  No IWOB. Good air  movement bilaterally. CVS:  RRR. Heart sounds normal.  ABD/GI/GU: Bowel sounds present. Soft. Non tender.  RLQ urostomy.  Nephrostomy tube over left lower back with dark red looking urine in nephrostomy bag MSK/EXT:  Moves extremities. No apparent deformity or edema.  SKIN: no apparent skin lesion or wound NEURO: Awake, alert and oriented appropriately.  No apparent focal neuro deficit.  Assessment & Plan: Left flank pain/left moderate hydronephrosis/possible left calyceal rupture/nonobstructing bilateral nephrolithiasis/developing left pyelonephritis -Urology consulted and recommended PCN by IR -Percutaneous nephrostomy tube placed by IR on 11/28/2018-bloody output. -Nephrostomy output culture negative so far. -Continue IV ceftriaxone 11/23>> -Nephrostomy output with dark red urine.  Continues to endorse severe pain.  Discussed with urology, Dr. Louis Meckel who recommended KUB to ensure PCN is in appropriate place.   Anemia of chronic disease: has dark red output in nephrostomy bag.  H&H dropped about 1.5 g since admission.  -Holding Plavix and aspirin -Monitor H&H -Check anemia panel  ESRD on HD MWF Bone mineral disorder -Nephrology following.  Essential hypertension/hypotension: Normotensive -Continue midodrine  Anxiety/insomnia/chronic pain -Continue home Ativan, trazodone and Ambien -Continue current pain regimen  GERD -Continue PPI  History of spina bifid: Stable  Hypothyroidism -Continue home Synthroid  Class II obesity: BMI 36.56 -Encourage lifestyle change to lose weight.  History of peripheral arterial disease -Continue home Plavix and aspirin  Thrombocytopenia: Platelet dropped to 140s but is stable.  He is low probability for HIT. -Recheck in the morning  DVT prophylaxis: SCD Code Status: Full code Family Communication: Patient and/or RN. Available if any question. Disposition Plan: Remains inpatient.  Continues to report severe left  flank pain and has bloody output in nephrostomy bag. Consultants: Urology, IR and nephrology  Procedures:  11/28/2018-percutaneous nephrostomy tube placement  Microbiology summarized: [id number] screen negative MRSA PCR negative Nephrostomy drain culture negative so far.  Sch Meds:  Scheduled Meds: . Chlorhexidine Gluconate Cloth  6 each Topical Daily  . Chlorhexidine Gluconate Cloth  6 each Topical Q0600  . [START ON 12/02/2018] doxercalciferol  1 mcg Intravenous Q M,W,F-HD  . famotidine  20 mg Oral Q1400  . feeding supplement (NEPRO CARB STEADY)  237 mL Oral BID BM  . levothyroxine  125 mcg Oral QAC breakfast  . LORazepam  1 mg Oral Daily  . sodium chloride flush  5 mL Intracatheter Q8H  . sodium polystyrene  15 g Oral Once per day on Sun Sat  . traZODone  100 mg Oral QHS   Continuous Infusions: . cefTRIAXone (ROCEPHIN)  IV 1 g (11/29/18 2038)   PRN Meds:.acetaminophen **OR** acetaminophen, fluticasone, heparin, HYDROmorphone (DILAUDID) injection, ondansetron (ZOFRAN) IV, oxyCODONE-acetaminophen **AND** oxyCODONE, zolpidem  Antimicrobials: Anti-infectives (From admission, onward)   Start     Dose/Rate Route Frequency Ordered Stop   11/26/18 1700  cefTRIAXone (ROCEPHIN) 1 g in sodium chloride 0.9 % 100 mL IVPB     1 g 200 mL/hr over 30 Minutes Intravenous Every 24 hours 11/26/18 1543         I have personally reviewed the following labs and images: CBC: Recent Labs  Lab 11/26/18 0138 11/26/18 0600 11/28/18 0031 11/29/18 1210 11/30/18 0453  WBC 12.1* 13.5* 10.7* 11.7* 9.5  NEUTROABS 11.1*  --   --   --   --   HGB 10.6* 10.4* 10.1* 9.4* 8.7*  HCT 33.6* 33.4* 32.0* 29.4* 27.6*  MCV 97.7 97.4 95.5 94.2 96.8  PLT 190 213 145* 161 147*   BMP &GFR Recent Labs  Lab 11/26/18 0138 11/26/18 0600 11/27/18 0610 11/28/18 0031 11/29/18 1210 11/30/18 0453  NA 135  --  132* 132* 133* 135  K 4.3  --  4.9 3.8 4.3 4.6  CL 94*  --  92* 92* 92* 95*  CO2 22  --  24 22 23 25    GLUCOSE 129*  --  131* 108* 140* 108*  BUN 48*  --  50* 63* 93* 65*  CREATININE 10.46* 10.63* 10.23* 11.10* 13.08* 9.39*  CALCIUM 9.1  --  9.1 9.1 8.6* 8.4*  MG  --   --   --  1.9 2.1 2.0  PHOS  --   --   --  2.7 2.1* 2.4*   Estimated Creatinine Clearance: 12 mL/min (A) (by C-G formula based on SCr of 9.39 mg/dL (H)). Liver & Pancreas: Recent Labs  Lab 11/26/18 0138 11/27/18 0610 11/28/18 0031 11/29/18 1210 11/30/18 0453  AST 15 26  --   --   --   ALT 13 20  --   --   --   ALKPHOS 84 137*  --   --   --   BILITOT 1.1 2.1*  --   --   --   PROT 9.1* 8.2*  --   --   --   ALBUMIN 3.2* 2.8* 2.7* 2.5* 2.4*   Recent Labs  Lab 11/26/18 0138  LIPASE 23   No results for input(s): AMMONIA in the last 168 hours. Diabetic: No results for input(s): HGBA1C in the  last 72 hours. No results for input(s): GLUCAP in the last 168 hours. Cardiac Enzymes: No results for input(s): CKTOTAL, CKMB, CKMBINDEX, TROPONINI in the last 168 hours. No results for input(s): PROBNP in the last 8760 hours. Coagulation Profile: Recent Labs  Lab 11/26/18 0815  INR 1.2   Thyroid Function Tests: No results for input(s): TSH, T4TOTAL, FREET4, T3FREE, THYROIDAB in the last 72 hours. Lipid Profile: No results for input(s): CHOL, HDL, LDLCALC, TRIG, CHOLHDL, LDLDIRECT in the last 72 hours. Anemia Panel: No results for input(s): VITAMINB12, FOLATE, FERRITIN, TIBC, IRON, RETICCTPCT in the last 72 hours. Urine analysis:    Component Value Date/Time   COLORURINE RED (A) 07/31/2010 0410   APPEARANCEUR TURBID (A) 07/31/2010 0410   LABSPEC 1.044 (H) 07/31/2010 0410   PHURINE 6.5 07/31/2010 0410   GLUCOSEU 100 (A) 07/31/2010 0410   HGBUR LARGE (A) 07/31/2010 0410   BILIRUBINUR LARGE (A) 07/31/2010 0410   KETONESUR 40 (A) 07/31/2010 0410   PROTEINUR >300 (A) 07/31/2010 0410   UROBILINOGEN 4.0 (H) 07/31/2010 0410   NITRITE POSITIVE (A) 07/31/2010 0410   LEUKOCYTESUR LARGE (A) 07/31/2010 0410   Sepsis Labs:  Invalid input(s): PROCALCITONIN, [place]  Microbiology: Recent Results (from the past 240 hour(s))  SARS CORONAVIRUS 2 (TAT 6-24 HRS) Nasopharyngeal Nasopharyngeal Swab     Status: None   Collection Time: 11/26/18  1:37 PM   Specimen: Nasopharyngeal Swab  Result Value Ref Range Status   SARS Coronavirus 2 NEGATIVE NEGATIVE Final    Comment: (NOTE) SARS-CoV-2 target nucleic acids are NOT DETECTED. The SARS-CoV-2 RNA is generally detectable in upper and lower respiratory specimens during the acute phase of infection. Negative results do not preclude SARS-CoV-2 infection, do not rule out co-infections with other pathogens, and should not be used as the sole basis for treatment or other patient management decisions. Negative results must be combined with clinical observations, patient history, and epidemiological information. The expected result is Negative. Fact Sheet for Patients: SugarRoll.be Fact Sheet for Healthcare Providers: https://www.woods-mathews.com/ This test is not yet approved or cleared by the [place] FDA and  has been authorized for detection and/or diagnosis of SARS-CoV-2 by FDA under an Emergency Use Authorization (EUA). This EUA will remain  in effect (meaning this test can be used) for the duration of the COVID-19 declaration under Section 56 4(b)(1) of the Act, 21 U.S.C. section 360bbb-3(b)(1), unless the authorization is terminated or revoked sooner. Performed at [facility], [place] [address], [place], [place] [address]   MRSA PCR Screening     Status: None   Collection Time: 11/27/18  7:31 AM   Specimen: Nasal Mucosa; Nasopharyngeal  Result Value Ref Range Status   MRSA by PCR NEGATIVE NEGATIVE Final    Comment:        The GeneXpert MRSA Assay (FDA approved for NASAL specimens only), is one component of a comprehensive MRSA colonization surveillance program. It is not intended to diagnose MRSA  infection nor to guide or monitor treatment for MRSA infections. Performed at [facility], [place] [address], [place], [place] [address]   Aerobic/Anaerobic Culture (surgical/deep wound)     Status: None (Preliminary result)   Collection Time: 11/28/18 10:40 AM   Specimen: Abscess; Urine  Result Value Ref Range Status   Specimen Description ABSCESS LEFT NEPHROSTOMY PLACEMENT  Final   Special Requests Normal  Final   Gram Stain   Final    ABUNDANT WBC PRESENT,BOTH PMN AND MONONUCLEAR NO ORGANISMS SEEN  Culture   Final    NO GROWTH < 24 HOURS Performed at [facility], [place] [address] [place], [place] [address]    Report Status PENDING  Incomplete    Radiology Studies: No results found.   T. Preston  If 7PM-7AM, please contact night-coverage [email or web address] Password [facility] 11/30/2018, 11:48 AM

## 2018-11-30 NOTE — Progress Notes (Signed)
Called for hematuria per Neph tube.  Urine is dark, no clots.  KUB shows that it is appropriately positioned and hasn't moved.  Suspect bleeding is from platelet dysfunction due to HD as well as residual effect of plavix and aspirin.  Supportive care indicated, no acute intervention warranted.  Patient should follow-up with Dr. Claudia Desanctis as outpatient to discuss further management.

## 2018-11-30 NOTE — Evaluation (Signed)
Physical Therapy Evaluation Patient Details Name: [name] MRN: [id number] DOB: 12-Oct-1965 Today's Date: 11/30/2018   History of Present Illness  Pt is a 53 y/o male admitted secondary to L flank pain found to have left moderate hydronephrosis, calyceal rupture, nonobstructing bilateral nephrolithiasis and developing pyelonephritis as noted on CT. Nephrology and urology consulted.  Pt is now s/p L percutaneous nephrostomy drain placement on 11/26. PMH including but not limited to Spina Bifida, neurogenic bladder, seizures, ESRD and HTN.    Clinical Impression  Pt presented sitting upright in bed, awake and willing to participate in a limited therapy session. Pt very focused on pain level throughout and questioning what the medical plan is for him as the drain does not seem to be helping his pain. He also reported that none of the pain medicine he has received has helped alleviate his pain either. Prior to admission, pt reported that he was ambulating short distances within his home without use of an AD and was independent with ADLs. Pt lives with his sister in a single level home with a level entry. He utilizes a manual w/c for mobilizing within the community for longer distance. At the time of evaluation, pt greatly limited secondary to reported 10/10 pain. He was at a mod I level for bed mobility and demonstrated excellent sitting balance with supervision for safety. Hopeful to progress to transfers and gait training at next session as pain is better managed. Pt would continue to benefit from skilled physical therapy services at this time while admitted and after d/c to address the below listed limitations in order to improve overall safety and independence with functional mobility.     Follow Up Recommendations Home health PT;Supervision for mobility/OOB    Equipment Recommendations  None recommended by PT    Recommendations for Other Services       Precautions / Restrictions  Precautions Precautions: Fall Precaution Comments: L neph drain Restrictions Weight Bearing Restrictions: No      Mobility  Bed Mobility Overal bed mobility: Modified Independent                Transfers                 General transfer comment: pt declining secondary to 10/10 pain with no relief from pain meds  Ambulation/Gait                Stairs            Wheelchair Mobility    Modified Rankin (Stroke Patients Only)       Balance Overall balance assessment: Needs assistance Sitting-balance support: Feet supported Sitting balance-Leahy Scale: Good Sitting balance - Comments: pt able to sit EOB x ~20 mins while reaching within and outside of his BoS, crossing midline with either UE with no LOB or need for physical assistance or UE support                                     Pertinent Vitals/Pain Pain Assessment: 0-10 Pain Score: 10-Worst pain ever Pain Location: L abdomen and drain site Pain Descriptors / Indicators: Constant;Sore Pain Intervention(s): Monitored during session;Repositioned;RN gave pain meds during session    [place] expects to be discharged to:: Private residence Living Arrangements: Other relatives(sister) Available Help at Discharge: Family;Available PRN/intermittently   Home Access: Level entry     Home Layout: One level Home Equipment: Wheelchair -  manual;Wheelchair - power;Hospital bed      Prior Function Level of Independence: Independent with assistive device(s)         Comments: pt able to ambulate short distances without any AD; uses a w/c for community level mobility     Hand Dominance        Extremity/Trunk Assessment   Upper Extremity Assessment Upper Extremity Assessment: Generalized weakness    Lower Extremity Assessment Lower Extremity Assessment: Generalized weakness       Communication   Communication: No difficulties  Cognition  Arousal/Alertness: Awake/alert Behavior During Therapy: WFL for tasks assessed/performed Overall Cognitive Status: Within Functional Limits for tasks assessed                                        General Comments      Exercises     Assessment/Plan    PT Assessment Patient needs continued PT services  PT Problem List Decreased activity tolerance;Decreased balance;Decreased mobility;Pain       PT Treatment Interventions Gait training;DME instruction;Stair training;Functional mobility training;Balance training;Therapeutic exercise;Therapeutic activities;Neuromuscular re-education;Patient/family education    PT Goals (Current goals can be found in the Care Plan section)  Acute Rehab PT Goals Patient Stated Goal: decrease pain "I just want them to fix this!" PT Goal Formulation: With patient Time For Goal Achievement: 12/14/18 Potential to Achieve Goals: Good    Frequency Min 3X/week   Barriers to discharge        Co-evaluation               AM-PAC PT "6 Clicks" Mobility  Outcome Measure Help needed turning from your back to your side while in a flat bed without using bedrails?: None Help needed moving from lying on your back to sitting on the side of a flat bed without using bedrails?: None Help needed moving to and from a bed to a chair (including a wheelchair)?: A Little Help needed standing up from a chair using your arms (e.g., wheelchair or bedside chair)?: A Little Help needed to walk in hospital room?: A Little Help needed climbing 3-5 steps with a railing? : Total 6 Click Score: 18    End of Session   Activity Tolerance: Patient limited by pain Patient left: in bed;with call bell/phone within reach Nurse Communication: Mobility status PT Visit Diagnosis: Other abnormalities of gait and mobility (R26.89);Pain Pain - Right/Left: Left Pain - part of body: (abdomen)    Time: [id number] PT Time Calculation (min) (ACUTE ONLY): 21  min   Charges:   PT Evaluation $PT Eval Low Complexity: 1 Low          Eduard Clos, PT, DPT  Acute Rehabilitation Services Pager [phone] Office [place] 11/30/2018, 10:19 AM

## 2018-12-01 ENCOUNTER — Inpatient Hospital Stay (HOSPITAL_COMMUNITY): Payer: Medicare Other

## 2018-12-01 DIAGNOSIS — N132 Hydronephrosis with renal and ureteral calculous obstruction: Secondary | ICD-10-CM | POA: Diagnosis not present

## 2018-12-01 DIAGNOSIS — N186 End stage renal disease: Secondary | ICD-10-CM | POA: Diagnosis not present

## 2018-12-01 DIAGNOSIS — R319 Hematuria, unspecified: Secondary | ICD-10-CM

## 2018-12-01 DIAGNOSIS — G894 Chronic pain syndrome: Secondary | ICD-10-CM

## 2018-12-01 DIAGNOSIS — R109 Unspecified abdominal pain: Secondary | ICD-10-CM | POA: Diagnosis not present

## 2018-12-01 DIAGNOSIS — K5900 Constipation, unspecified: Secondary | ICD-10-CM

## 2018-12-01 DIAGNOSIS — N200 Calculus of kidney: Secondary | ICD-10-CM | POA: Diagnosis not present

## 2018-12-01 DIAGNOSIS — N390 Urinary tract infection, site not specified: Secondary | ICD-10-CM

## 2018-12-01 LAB — CBC
HCT: 29.7 % — ABNORMAL LOW (ref 39.0–52.0)
Hemoglobin: 9.6 g/dL — ABNORMAL LOW (ref 13.0–17.0)
MCH: 30.5 pg (ref 26.0–34.0)
MCHC: 32.3 g/dL (ref 30.0–36.0)
MCV: 94.3 fL (ref 80.0–100.0)
Platelets: 184 10*3/uL (ref 150–400)
RBC: 3.15 MIL/uL — ABNORMAL LOW (ref 4.22–5.81)
RDW: 15 % (ref 11.5–15.5)
WBC: 13.3 10*3/uL — ABNORMAL HIGH (ref 4.0–10.5)
nRBC: 0 % (ref 0.0–0.2)

## 2018-12-01 LAB — RENAL FUNCTION PANEL
Albumin: 2.6 g/dL — ABNORMAL LOW (ref 3.5–5.0)
Anion gap: 17 — ABNORMAL HIGH (ref 5–15)
BUN: 86 mg/dL — ABNORMAL HIGH (ref 6–20)
CO2: 21 mmol/L — ABNORMAL LOW (ref 22–32)
Calcium: 9.2 mg/dL (ref 8.9–10.3)
Chloride: 95 mmol/L — ABNORMAL LOW (ref 98–111)
Creatinine, Ser: 10.73 mg/dL — ABNORMAL HIGH (ref 0.61–1.24)
GFR calc Af Amer: 6 mL/min — ABNORMAL LOW (ref >60)
GFR calc non Af Amer: 5 mL/min — ABNORMAL LOW (ref >60)
Glucose, Bld: 107 mg/dL — ABNORMAL HIGH (ref 70–99)
Phosphorus: 2.9 mg/dL (ref 2.5–4.6)
Potassium: 4.7 mmol/L (ref 3.5–5.1)
Sodium: 133 mmol/L — ABNORMAL LOW (ref 135–145)

## 2018-12-01 LAB — APTT: aPTT: 35 seconds (ref 24–36)

## 2018-12-01 LAB — PROTIME-INR
INR: 1 (ref 0.8–1.2)
Prothrombin Time: 13.3 seconds (ref 11.4–15.2)

## 2018-12-01 LAB — MAGNESIUM: Magnesium: 2 mg/dL (ref 1.7–2.4)

## 2018-12-01 MED ORDER — HYDROMORPHONE HCL 1 MG/ML IJ SOLN
1.0000 mg | INTRAMUSCULAR | Status: DC | PRN
  Administered 2018-12-01 (×2): 1 mg via INTRAVENOUS
  Administered 2018-12-02 (×3): 2 mg via INTRAVENOUS
  Administered 2018-12-02: 1 mg via INTRAVENOUS
  Administered 2018-12-03 – 2018-12-05 (×10): 2 mg via INTRAVENOUS
  Administered 2018-12-05: 1 mg via INTRAVENOUS
  Administered 2018-12-05 (×2): 2 mg via INTRAVENOUS
  Administered 2018-12-06: 1 mg via INTRAVENOUS
  Administered 2018-12-06 (×2): 2 mg via INTRAVENOUS
  Administered 2018-12-06: 1 mg via INTRAVENOUS
  Administered 2018-12-07 – 2018-12-10 (×14): 2 mg via INTRAVENOUS
  Filled 2018-12-01 (×2): qty 2
  Filled 2018-12-01: qty 1
  Filled 2018-12-01 (×10): qty 2
  Filled 2018-12-01: qty 1
  Filled 2018-12-01 (×2): qty 2
  Filled 2018-12-01: qty 1
  Filled 2018-12-01 (×2): qty 2
  Filled 2018-12-01: qty 1
  Filled 2018-12-01 (×5): qty 2
  Filled 2018-12-01: qty 1
  Filled 2018-12-01 (×6): qty 2
  Filled 2018-12-01: qty 1
  Filled 2018-12-01 (×3): qty 2

## 2018-12-01 MED ORDER — HYDROMORPHONE HCL 1 MG/ML IJ SOLN
0.5000 mg | INTRAMUSCULAR | Status: DC | PRN
  Administered 2018-12-01: 0.5 mg via INTRAVENOUS
  Filled 2018-12-01 (×2): qty 1

## 2018-12-01 MED ORDER — POLYETHYLENE GLYCOL 3350 17 G PO PACK
17.0000 g | PACK | Freq: Two times a day (BID) | ORAL | Status: DC
  Administered 2018-12-01 – 2018-12-10 (×3): 17 g via ORAL
  Filled 2018-12-01 (×13): qty 1

## 2018-12-01 MED ORDER — SENNOSIDES-DOCUSATE SODIUM 8.6-50 MG PO TABS
2.0000 | ORAL_TABLET | Freq: Once | ORAL | Status: AC
  Administered 2018-12-01: 2 via ORAL
  Filled 2018-12-01: qty 2

## 2018-12-01 MED ORDER — OXYCODONE-ACETAMINOPHEN 7.5-325 MG PO TABS
1.0000 | ORAL_TABLET | Freq: Four times a day (QID) | ORAL | Status: DC | PRN
  Administered 2018-12-01 – 2018-12-10 (×25): 1 via ORAL
  Filled 2018-12-01 (×28): qty 1

## 2018-12-01 MED ORDER — SODIUM CHLORIDE 0.9 % IV SOLN
1.0000 g | INTRAVENOUS | Status: AC
  Administered 2018-12-01 – 2018-12-03 (×3): 1 g via INTRAVENOUS
  Filled 2018-12-01 (×3): qty 1
  Filled 2018-12-01: qty 10

## 2018-12-01 MED ORDER — ASPIRIN EC 81 MG PO TBEC: 81.0000 mg | DELAYED_RELEASE_TABLET | Freq: Every day | ORAL | 0 refills | Status: AC

## 2018-12-01 MED ORDER — SODIUM CHLORIDE 0.9 % IV SOLN
2.0000 g | INTRAVENOUS | Status: DC
  Filled 2018-12-01: qty 20

## 2018-12-01 MED ORDER — CEPHALEXIN 500 MG PO CAPS: 500.0000 mg | ORAL_CAPSULE | Freq: Two times a day (BID) | ORAL | 0 refills | Status: AC

## 2018-12-01 NOTE — Consult Note (Signed)
We are following this patient for left ureteral stone with associated hydronephrosis, potential pyelonephritis and renal abscess.  He is status post left nephrostomy tube placement.  Interval: The patient has had persistent left-sided lower abdominal pain despite the nephrostomy tube placement.  The neph tube has had some gross hematuria, but actually today is clear and draining some urine.  Subjective: The patient unable to get any pain relief.  This is impacting his ability to sleep, and creating some frustration.  No bowel movement since 11/23.    O: NAD Vitals:   11/30/18 1811 11/30/18 2240 12/01/18 0434 12/01/18 1024  BP: 130/65 131/73 (!) 160/86 (!) 93/57  Pulse: 84 76 81 72  Resp: 18 18 16 18   Temp: 98.7 F (37.1 C) 98.4 F (36.9 C) 98.7 F (37.1 C) 98.6 F (37 C)  TempSrc: Oral  Oral Oral  SpO2: 97% 98% 96% 97%  Weight:      Height:       I/O last 3 completed shifts: In: [id number] [P.O.:1280; Other:25] Out: [id number]  Abdomen is soft, tender in LLQ Neph tube is draining straw colored urine with blood in his bag Recent Labs    11/29/18 1210 11/30/18 0453 12/01/18 0501  WBC 11.7* 9.5 13.3*  HGB 9.4* 8.7* 9.6*  HCT 29.4* 27.6* 29.7*   Recent Labs    11/29/18 1210 11/30/18 0453 12/01/18 0501  NA 133* 135 133*  K 4.3 4.6 4.7  CL 92* 95* 95*  CO2 23 25 21*  GLUCOSE 140* 108* 107*  BUN 93* 65* 86*  CREATININE 13.08* 9.39* 10.73*  CALCIUM 8.6* 8.4* 9.2   Recent Labs    12/01/18 0501  INR 1.0   No results for input(s): PSA in the last 72 hours. No results for input(s): LABURIN in the last 72 hours. Results for orders placed or performed during the hospital encounter of 11/25/18  SARS CORONAVIRUS 2 (TAT 6-24 HRS) Nasopharyngeal Nasopharyngeal Swab     Status: None   Collection Time: 11/26/18  1:37 PM   Specimen: Nasopharyngeal Swab  Result Value Ref Range Status   SARS Coronavirus 2 NEGATIVE NEGATIVE Final    Comment: (NOTE) SARS-CoV-2 target nucleic  acids are NOT DETECTED. The SARS-CoV-2 RNA is generally detectable in upper and lower respiratory specimens during the acute phase of infection. Negative results do not preclude SARS-CoV-2 infection, do not rule out co-infections with other pathogens, and should not be used as the sole basis for treatment or other patient management decisions. Negative results must be combined with clinical observations, patient history, and epidemiological information. The expected result is Negative. Fact Sheet for Patients: SugarRoll.be Fact Sheet for Healthcare Providers: https://www.woods-mathews.com/ This test is not yet approved or cleared by the [place] FDA and  has been authorized for detection and/or diagnosis of SARS-CoV-2 by FDA under an Emergency Use Authorization (EUA). This EUA will remain  in effect (meaning this test can be used) for the duration of the COVID-19 declaration under Section 56 4(b)(1) of the Act, 21 U.S.C. section 360bbb-3(b)(1), unless the authorization is terminated or revoked sooner. Performed at [name], [place] [address], [place], [place] [address]   MRSA PCR Screening     Status: None   Collection Time: 11/27/18  7:31 AM   Specimen: Nasal Mucosa; Nasopharyngeal  Result Value Ref Range Status   MRSA by PCR NEGATIVE NEGATIVE Final    Comment:        The GeneXpert MRSA Assay (FDA approved for NASAL  specimens only), is one component of a comprehensive MRSA colonization surveillance program. It is not intended to diagnose MRSA infection nor to guide or monitor treatment for MRSA infections. Performed at [facility], [place] [address], [place], [place] [address]   Aerobic/Anaerobic Culture (surgical/deep wound)     Status: None (Preliminary result)   Collection Time: 11/28/18 10:40 AM   Specimen: Abscess; Urine  Result Value Ref Range Status   Specimen Description ABSCESS LEFT NEPHROSTOMY PLACEMENT   Final   Special Requests Normal  Final   Gram Stain   Final    ABUNDANT WBC PRESENT,BOTH PMN AND MONONUCLEAR NO ORGANISMS SEEN    Culture   Final    CULTURE REINCUBATED FOR BETTER GROWTH Performed at [facility], [address], [place], [place] [address]    Report Status PENDING  Incomplete    I independently reviewed the patient's CT scan and discussed it with the patient.  The patient has a very small stone the distal left ureter close to the uretero urostomy anastomosis.  There is stranding around the left kidney, there is mild hydronephrosis.  The right kidney is atrophic.  Impression: The patient had left hydronephrosis, likely large amount of this chronic given that he has a urostomy.  There is a small stone at the uretero- ileal conduit junction.  The nephrostomy tube is in good position and is draining urine.  Despite this the patient's pain continues.  The urine culture from the placement of the nephrostomy tube is pending, the patient remains on ceftriaxone.  Fortunately he has been afebrile.  He has not had a bowel movement since prior to his hospitalization.  Recommendations: I recommended that the patient be started back on his home pain medication regimen and increase his oxycodone by 5 mg every 6 hours, for a total of 15 mg of oxycodone every 6 hours.  He can then be given IV pain medication for breakthrough pain.  Follow-up the urine culture from his nephrostomy tube and narrow antibiotics as able.  Would recommend an aggressive bowel regimen so that the patient is able to have a large bowel movement and resolve his constipation which also may be contributing to his pain.  The patient can follow-up for discussion of further management of his stone versus nephrectomy in the future, as an outpatient.  We will continue to follow this patient from [place], if you need any additional questions answered or have any additional concerns please page.

## 2018-12-01 NOTE — Progress Notes (Signed)
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 But she also pointed out that this bacteria is often associated with GI malignancies and with polymicrobial infections.   [name], MD 12/01/2018, 3:10 PM

## 2018-12-01 NOTE — Progress Notes (Addendum)
[facility] Progress Note   Subjective:   Seen in room, sitting in recliner. Continues to c/o pain, states dilaudid helps "a little." Ongoing insomnia. Denies SOB, orthopnea, CP, palpitations, dizziness, N/V/D.   Objective Vitals:   11/30/18 1811 11/30/18 2240 12/01/18 0434 12/01/18 1024  BP: 130/65 131/73 (!) 160/86 (!) 93/57  Pulse: 84 76 81 72  Resp: 18 18 16 18   Temp: 98.7 F (37.1 C) 98.4 F (36.9 C) 98.7 F (37.1 C) 98.6 F (37 C)  TempSrc: Oral  Oral Oral  SpO2: 97% 98% 96% 97%  Weight:      Height:       Physical Exam General:Well developed male, alert, sitting on bedside.  Heart:RRR, + blowing systolic murmur, no rubs or gallops Lungs:CTA bilaterally without wheezing, rhonchi or rales Abdomen:Soft, non-tender, non-distended. + L percnephrostomy tube Extremities:Trace edema LLE, no edema RLE Dialysis Access:R femoral catheter   Additional Objective Labs: Basic Metabolic Panel: Recent Labs  Lab 11/29/18 1210 11/30/18 0453 12/01/18 0501  NA 133* 135 133*  K 4.3 4.6 4.7  CL 92* 95* 95*  CO2 23 25 21*  GLUCOSE 140* 108* 107*  BUN 93* 65* 86*  CREATININE 13.08* 9.39* 10.73*  CALCIUM 8.6* 8.4* 9.2  PHOS 2.1* 2.4* 2.9   Liver Function Tests: Recent Labs  Lab 11/26/18 0138 11/27/18 0610  11/29/18 1210 11/30/18 0453 12/01/18 0501  AST 15 26  --   --   --   --   ALT 13 20  --   --   --   --   ALKPHOS 84 137*  --   --   --   --   BILITOT 1.1 2.1*  --   --   --   --   PROT 9.1* 8.2*  --   --   --   --   ALBUMIN 3.2* 2.8*   < > 2.5* 2.4* 2.6*   < > = values in this interval not displayed.   Recent Labs  Lab 11/26/18 0138  LIPASE 23   CBC: Recent Labs  Lab 11/26/18 0138 11/26/18 0600 11/28/18 0031 11/29/18 1210 11/30/18 0453 12/01/18 0501  WBC 12.1* 13.5* 10.7* 11.7* 9.5 13.3*  NEUTROABS 11.1*  --   --   --   --   --   HGB 10.6* 10.4* 10.1* 9.4* 8.7* 9.6*  HCT 33.6* 33.4* 32.0* 29.4* 27.6* 29.7*  MCV 97.7 97.4 95.5  94.2 96.8 94.3  PLT 190 213 145* 161 147* 184   Blood Culture    Component Value Date/Time   SDES ABSCESS LEFT NEPHROSTOMY PLACEMENT 11/28/2018 1040   SPECREQUEST Normal 11/28/2018 1040   CULT  11/28/2018 1040    FEW FUSOBACTERIUM NUCLEATUM BETA LACTAMASE NEGATIVE Performed at [facility], [place] [address] [place], [place] [address]    REPTSTATUS PENDING 11/28/2018 1040    Studies/Results: Kub  Result Date: 11/30/2018 CLINICAL DATA:  53 year old male with history of bloody nephrostomy tube output. EXAM: PORTABLE ABDOMEN - 1 VIEW COMPARISON:  No priors. FINDINGS: Nephrostomy tube projecting over the left retroperitoneum with properly reformed pigtail drainage catheter. Right femoral PermCath noted with tip extending into the intrahepatic IVC. The bowel gas pattern is normal. No radio-opaque calculi or other significant radiographic abnormality are seen. Vascular stent projecting over the left common and external iliac arteries. IMPRESSION: 1. Support apparatus, as above. 2. Nonobstructive bowel gas pattern. 3. No pneumoperitoneum. Electronically Signed   By: Vinnie Langton M.D.   On: 11/30/2018 12:42  Medications: . cefTRIAXone (ROCEPHIN)  IV 1 g (11/30/18 1732)   . Chlorhexidine Gluconate Cloth  6 each Topical Daily  . Chlorhexidine Gluconate Cloth  6 each Topical Q0600  . [START ON 12/02/2018] doxercalciferol  1 mcg Intravenous Q M,W,F-HD  . famotidine  20 mg Oral Q1400  . feeding supplement (NEPRO CARB STEADY)  237 mL Oral BID BM  . levothyroxine  125 mcg Oral QAC breakfast  . LORazepam  1 mg Oral Daily  . polyethylene glycol  17 g Oral BID  . sodium chloride flush  5 mL Intracatheter Q8H  . sodium polystyrene  15 g Oral Once per day on Sun Sat  . traZODone  100 mg Oral QHS    Dialysis Orders: Abelina Bachelor mwf. TQ:[id number] kg ( leaving below edw ) 4hr 4minHD Bath 2k 2.25caHeparin 3,700. AccessR fem pcBFR 350DFR 800  Hec 1mcg IV/HD  Mircera 75 mcg (  last on 11/24/18 ) was on q 4 wks )Units IV/HD  Venofer 100mg  x 10 start 11/25- 12/10 /20  Assessment/Plan: 1.L ureteral calculi:With moderate hydronephrosis and calyceal rupture. In significant pain. S/p L percutaneous nephrostromy tube placement by IR on 11/26. On rocephin. Bloody output in bag but clearing. Urology recommending titrating pain meds and outpatient follow up for further management.  2. ESRD:MWF. K+ 4.7. Requests to complete HD in a recliner for comfort.  3.HTN/volume:BPcontrolled/soft this AM. Below outpatient EDW. Trace edema on exam, UF tomorrow as tolerated.   4. Anemia:Hgb9.6. Not due for ESA yet. Will hold IV Fe while on IV antibiotics, resume once antibiotics are completed. 5. Secondary hyperparathyroidism:Corrected calcium 10.3, hectorol reduced but still w/ hypercalcemia, will d/c for now.Phos 2.7, calcium acetate decreased but phos declined further to 2.1 so d/c calcium acetate on 11/27 Phos now 2.7, follow. 6. Nutrition:Renal diet with fluid restrictions.  Anice Paganini, PA-C 12/01/2018, 1:28 PM  [facility] Pager: [phone]  Pt seen, examined and agree w A/P as above. Patient c/o inadequate pain control, have d/w urol and primary MD, will change to IV cipro in case missing bacterial coverage as cause of ^pain, and also will ^ dilaudid to 1-2mg  q 4hr prn.  [name]  MD 12/01/2018, 2:07 PM

## 2018-12-01 NOTE — Progress Notes (Signed)
PROGRESS NOTE  [name] [id number] DOB: [birth year]   PCP: [name], MD  Patient is from: Home  DOA: 11/25/2018 LOS: 5  Brief Narrative / Interim history: 53 year old male with history of ESRD on HD MWF, spinal bifid, neurogenic bladder/ileal conduit, seizures, migraine headache, HTN/HoTN, kidney stones, GERD and anxiety presenting with left flank pain on 11/25/2018 and admitted for left moderate hydronephrosis, calyceal rupture, nonobstructing bilateral nephrolithiasis and developing pyelonephritis as noted on CT. Nephrology and urology consulted.  Urology recommended left percutaneous nephrostomy by IR, and outpatient follow-up.  Seen by IR 11/26/2018.  Per IR note, declined percutaneous nephrostomy, asking for nephrectomy. However, he later agreed to nephrostomy tube placement.  IR reconsulted 11/27/2018.  Underwent left nephrostomy tube placement on 11/28/2018.  Subjective: No major events overnight of this morning.  He was by the bedside on his knees when I walked in.  When asked how he is feeling, he says "it does not matter".  Then he started raising his voice saying "no one is doing anything and I am in pain".  I explained to him what we have done and what we are doing. He also states the urologist has not come to see him.  He is not forthcoming for the rest of the encounter.   Objective: Vitals:   11/30/18 1811 11/30/18 2240 12/01/18 0434 12/01/18 1024  BP: 130/65 131/73 (!) 160/86 (!) 93/57  Pulse: 84 76 81 72  Resp: 18 18 16 18   Temp: 98.7 F (37.1 C) 98.4 F (36.9 C) 98.7 F (37.1 C) 98.6 F (37 C)  TempSrc: Oral  Oral Oral  SpO2: 97% 98% 96% 97%  Weight:      Height:        Intake/Output Summary (Last 24 hours) at 12/01/2018 1255 Last data filed at 12/01/2018 0900 Gross per 24 hour  Intake 560 ml  Output 75 ml  Net 485 ml   Filed Weights   11/29/18 1352 11/29/18 1806 11/30/18 0500  Weight: 122 kg 120.5 kg 120.6 kg    Examination:  GENERAL:  No acute distress.  Appears well.  HEENT: MMM.  Vision and hearing grossly intact.  NECK: Supple.  No apparent JVD.  RESP:  No IWOB. Good air movement bilaterally. CVS:  RRR. Heart sounds normal.  ABD/GI/GU: Bowel sounds present. Soft.  RLQ urostomy.  About 80 cc dark red urine in in the nephrostomy bag MSK/EXT:  Moves extremities. No apparent deformity or edema.  SKIN: old abdominal surgical scar NEURO: Awake, alert and oriented appropriately.  No apparent focal neuro deficit. PSYCH: Calm.  Flat affect  Assessment & Plan: Left flank pain/left moderate hydronephrosis/possible left calyceal rupture/nonobstructing bilateral nephrolithiasis/developing left pyelonephritis -Urology consulted and recommended PCN by IR-initially patient refused but agreed later. -Percutaneous nephrostomy tube placed by IR on 11/28/2018-bloody output. -Continue IV ceftriaxone 11/23>> -Urology reconsulted 11/28 and 11/29 for nephrostomy output and persistent left flank pain.  Per urology, concern about pyelo-, constipation and reduced home Percocet contributing to patient's pain.  Recommends following urine culture, aggressive bowel regimen and increasing Percocet to home dose or even further to 15 mg oxycodone every 6h and outpatient follow-up for further care.  Patient is convinced that his pain is coming from the kidney stone even after he talked to urology. He does not think he is constipated.  Urine culture with beta lactamase negative Fusobacterium nucleatum.  His pyelo should have improved with ceftriaxone by now.  Patient has no fever or other signs to suggest infectious process.  Patient  is already on 1 mg of Dilaudid every 4 hours besides home dose Percocet.  He also does not have autonomic response to correlate with his "severe pain".  He has chronic pain syndrome.  He feels Percocet 10/325mg , #120 monthly in addition to Ambien and Ativan. -I have increased his Percocet to 7.5/325 mg every 6 hours -Continue  oxycodone IR 5 mg every 6 hours -Reduced IV Dilaudid to 0.5mg  q4 hours. -Senokot and MiraLAX twice daily as needed for constipation  Anemia of chronic disease: has dark red output in nephrostomy bag.  H&H stable. -Holding Plavix and aspirin -Monitor H&H  ESRD on HD MWF Bone mineral disorder -Nephrology following.  -I informed patient possible early discharge in the morning that he may have to go to his outpatient HD center for dialysis.   Essential hypertension/hypotension: Normotensive -Continue midodrine  Anxiety/insomnia/chronic pain syndrome -Continue home Ativan, trazodone and Ambien -Pain control as above.  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  History of peripheral arterial disease -Holding home Plavix and aspirin in the setting of hematuria.  Thrombocytopenia: Platelet dropped to 140s but is stable.  He is low probability for HIT. -Recheck in the morning  Hematuria: H&H stable. -Continue holding Plavix and aspirin.               DVT prophylaxis: SCD Code Status: Full code Family Communication: Patient and/or RN. Available if any question. Disposition Plan: Remains inpatient.  Continues to report severe left flank pain and has bloody output in nephrostomy bag. Consultants: Urology, IR and nephrology  Procedures:  11/28/2018-percutaneous nephrostomy tube placement  Microbiology summarized: [id number] screen negative MRSA PCR negative Nephrostomy drain culture negative so far.  Sch Meds:  Scheduled Meds: . Chlorhexidine Gluconate Cloth  6 each Topical Daily  . Chlorhexidine Gluconate Cloth  6 each Topical Q0600  . [START ON 12/02/2018] doxercalciferol  1 mcg Intravenous Q M,W,F-HD  . famotidine  20 mg Oral Q1400  . feeding supplement (NEPRO CARB STEADY)  237 mL Oral BID BM  . levothyroxine  125 mcg Oral QAC breakfast   . LORazepam  1 mg Oral Daily  . polyethylene glycol  17 g Oral BID  . senna-docusate  2 tablet Oral Once  . sodium chloride flush  5 mL Intracatheter Q8H  . sodium polystyrene  15 g Oral Once per day on Sun Sat  . traZODone  100 mg Oral QHS   Continuous Infusions: . cefTRIAXone (ROCEPHIN)  IV 1 g (11/30/18 1732)   PRN Meds:.acetaminophen **OR** acetaminophen, fluticasone, heparin, HYDROmorphone (DILAUDID) injection, ondansetron (ZOFRAN) IV, [DISCONTINUED] oxyCODONE-acetaminophen **AND** oxyCODONE, oxyCODONE-acetaminophen, zolpidem  Antimicrobials: Anti-infectives (From admission, onward)   Start     Dose/Rate Route Frequency Ordered Stop   12/01/18 0000  cephALEXin (KEFLEX) 500 MG capsule     500 mg Oral 2 times daily 12/01/18 1021 12/06/18 2359   11/26/18 1700  cefTRIAXone (ROCEPHIN) 1 g in sodium chloride 0.9 % 100 mL IVPB     1 g 200 mL/hr over 30 Minutes Intravenous Every 24 hours 11/26/18 1543         I have personally reviewed the following labs and images: CBC: Recent Labs  Lab 11/26/18 0138 11/26/18 0600 11/28/18 0031 11/29/18 1210 11/30/18 0453 12/01/18 0501  WBC 12.1* 13.5* 10.7* 11.7* 9.5 13.3*  NEUTROABS 11.1*  --   --   --   --   --  HGB 10.6* 10.4* 10.1* 9.4* 8.7* 9.6*  HCT 33.6* 33.4* 32.0* 29.4* 27.6* 29.7*  MCV 97.7 97.4 95.5 94.2 96.8 94.3  PLT 190 213 145* 161 147* 184   BMP &GFR Recent Labs  Lab 11/27/18 0610 11/28/18 0031 11/29/18 1210 11/30/18 0453 12/01/18 0501  NA 132* 132* 133* 135 133*  K 4.9 3.8 4.3 4.6 4.7  CL 92* 92* 92* 95* 95*  CO2 24 22 23 25  21*  GLUCOSE 131* 108* 140* 108* 107*  BUN 50* 63* 93* 65* 86*  CREATININE 10.23* 11.10* 13.08* 9.39* 10.73*  CALCIUM 9.1 9.1 8.6* 8.4* 9.2  MG  --  1.9 2.1 2.0 2.0  PHOS  --  2.7 2.1* 2.4* 2.9   Estimated Creatinine Clearance: 10.5 mL/min (A) (by C-G formula based on SCr of 10.73 mg/dL (H)). Liver & Pancreas: Recent Labs  Lab 11/26/18 0138 11/27/18 0610 11/28/18 0031 11/29/18  1210 11/30/18 0453 12/01/18 0501  AST 15 26  --   --   --   --   ALT 13 20  --   --   --   --   ALKPHOS 84 137*  --   --   --   --   BILITOT 1.1 2.1*  --   --   --   --   PROT 9.1* 8.2*  --   --   --   --   ALBUMIN 3.2* 2.8* 2.7* 2.5* 2.4* 2.6*   Recent Labs  Lab 11/26/18 0138  LIPASE 23   No results for input(s): AMMONIA in the last 168 hours. Diabetic: No results for input(s): HGBA1C in the last 72 hours. No results for input(s): GLUCAP in the last 168 hours. Cardiac Enzymes: No results for input(s): CKTOTAL, CKMB, CKMBINDEX, TROPONINI in the last 168 hours. No results for input(s): PROBNP in the last 8760 hours. Coagulation Profile: Recent Labs  Lab 11/26/18 0815 12/01/18 0501  INR 1.2 1.0   Thyroid Function Tests: No results for input(s): TSH, T4TOTAL, FREET4, T3FREE, THYROIDAB in the last 72 hours. Lipid Profile: No results for input(s): CHOL, HDL, LDLCALC, TRIG, CHOLHDL, LDLDIRECT in the last 72 hours. Anemia Panel: No results for input(s): VITAMINB12, FOLATE, FERRITIN, TIBC, IRON, RETICCTPCT in the last 72 hours. Urine analysis:    Component Value Date/Time   COLORURINE RED (A) 07/31/2010 0410   APPEARANCEUR TURBID (A) 07/31/2010 0410   LABSPEC 1.044 (H) 07/31/2010 0410   PHURINE 6.5 07/31/2010 0410   GLUCOSEU 100 (A) 07/31/2010 0410   HGBUR LARGE (A) 07/31/2010 0410   BILIRUBINUR LARGE (A) 07/31/2010 0410   KETONESUR 40 (A) 07/31/2010 0410   PROTEINUR >300 (A) 07/31/2010 0410   UROBILINOGEN 4.0 (H) 07/31/2010 0410   NITRITE POSITIVE (A) 07/31/2010 0410   LEUKOCYTESUR LARGE (A) 07/31/2010 0410   Sepsis Labs: Invalid input(s): PROCALCITONIN, [place]  Microbiology: Recent Results (from the past 240 hour(s))  SARS CORONAVIRUS 2 (TAT 6-24 HRS) Nasopharyngeal Nasopharyngeal Swab     Status: None   Collection Time: 11/26/18  1:37 PM   Specimen: Nasopharyngeal Swab  Result Value Ref Range Status   SARS Coronavirus 2 NEGATIVE NEGATIVE Final    Comment:  (NOTE) SARS-CoV-2 target nucleic acids are NOT DETECTED. The SARS-CoV-2 RNA is generally detectable in upper and lower respiratory specimens during the acute phase of infection. Negative results do not preclude SARS-CoV-2 infection, do not rule out co-infections with other pathogens, and should not be used as the sole basis for treatment or other patient management decisions. Negative results  must be combined with clinical observations, patient history, and epidemiological information. The expected result is Negative. Fact Sheet for Patients: SugarRoll.be Fact Sheet for Healthcare Providers: https://www.woods-mathews.com/ This test is not yet approved or cleared by the [place] FDA and  has been authorized for detection and/or diagnosis of SARS-CoV-2 by FDA under an Emergency Use Authorization (EUA). This EUA will remain  in effect (meaning this test can be used) for the duration of the COVID-19 declaration under Section 56 4(b)(1) of the Act, 21 U.S.C. section 360bbb-3(b)(1), unless the authorization is terminated or revoked sooner. Performed at [facility], [place] [address] [place], [place] [address]   MRSA PCR Screening     Status: None   Collection Time: 11/27/18  7:31 AM   Specimen: Nasal Mucosa; Nasopharyngeal  Result Value Ref Range Status   MRSA by PCR NEGATIVE NEGATIVE Final    Comment:        The GeneXpert MRSA Assay (FDA approved for NASAL specimens only), is one component of a comprehensive MRSA colonization surveillance program. It is not intended to diagnose MRSA infection nor to guide or monitor treatment for MRSA infections. Performed at [facility], [place] [address], [place], [place] [address]   Aerobic/Anaerobic Culture (surgical/deep wound)     Status: None (Preliminary result)   Collection Time: 11/28/18 10:40 AM   Specimen: Abscess; Urine  Result Value Ref Range Status   Specimen Description  ABSCESS LEFT NEPHROSTOMY PLACEMENT  Final   Special Requests Normal  Final   Gram Stain   Final    ABUNDANT WBC PRESENT,BOTH PMN AND MONONUCLEAR NO ORGANISMS SEEN    Culture   Final    FEW FUSOBACTERIUM NUCLEATUM BETA LACTAMASE NEGATIVE Performed at [facility], [place] [address], [place], [place] [address]    Report Status PENDING  Incomplete    Radiology Studies: No results found.  40 minutes with more than 50% spent in reviewing records, counseling patient/family and coordinating care.  Taye T. [place]  If 7PM-7AM, please contact night-coverage [email or web address] Password [facility] 12/01/2018, 12:55 PM

## 2018-12-01 NOTE — Progress Notes (Signed)
Pt expressed anger and frustration re: his care (on-going pain on nephrostomy site, not getting enough pain medicine, not been able to sleep and not offering solutions for his complaints). He requested to have another provider and also wanted palliative consult. Paged Dr. Cyndia Skeeters and new orders received.   PRN dilaudid given for breakthrough pain. Pt now requesting to transfer to a different hospital. MD aware. Charge RN aware.

## 2018-12-01 NOTE — Progress Notes (Addendum)
Patient requested to be seen by the Charge nurse,he immediately recognized me,becouse I was the one who admitted him.He expressed his anger and frustration for the medical people for not doing enough for his pain.He was asking how to be transfer to another hospital.Charge nurse explained to him that the medical team are really helping him,i reminded him that I was with him since the first day and I explained to him the updated pain medication  to control his pain.Charge nurse explained to him the process of transferring to the hospital.Charged nurse further explained that he is being consulted to palliative team.Patient somewhat startled and expressed that'' but i'm not dying".Charge nurse explained to patient that the reason palliative team was consulted, is for his pain management.Patient somewhat kind of tone down his voice and said ''he would be waiting for the palliative team to come to see him.

## 2018-12-01 NOTE — Care Management (Signed)
Spoke to patient on the phone to discuss DC plan (order in place then taken out). He is declining HH PT, citing that session was limited from pain. He lives at home with his sister who he states cannot manage his nephrostomy care. Discussed having a [facility] RN for help a few days a week opposite his HD. He would like to talk about this tomorrow. He was easily overwhelmed on the phone, and said he was in pain. CM will continue to follow.

## 2018-12-02 DIAGNOSIS — N200 Calculus of kidney: Secondary | ICD-10-CM | POA: Diagnosis not present

## 2018-12-02 DIAGNOSIS — N132 Hydronephrosis with renal and ureteral calculous obstruction: Secondary | ICD-10-CM | POA: Diagnosis not present

## 2018-12-02 DIAGNOSIS — R109 Unspecified abdominal pain: Secondary | ICD-10-CM

## 2018-12-02 DIAGNOSIS — K6289 Other specified diseases of anus and rectum: Secondary | ICD-10-CM

## 2018-12-02 DIAGNOSIS — R933 Abnormal findings on diagnostic imaging of other parts of digestive tract: Secondary | ICD-10-CM | POA: Diagnosis not present

## 2018-12-02 DIAGNOSIS — N186 End stage renal disease: Secondary | ICD-10-CM | POA: Diagnosis not present

## 2018-12-02 LAB — CBC
HCT: 27.7 % — ABNORMAL LOW (ref 39.0–52.0)
Hemoglobin: 9 g/dL — ABNORMAL LOW (ref 13.0–17.0)
MCH: 30.9 pg (ref 26.0–34.0)
MCHC: 32.5 g/dL (ref 30.0–36.0)
MCV: 95.2 fL (ref 80.0–100.0)
Platelets: 181 10*3/uL (ref 150–400)
RBC: 2.91 MIL/uL — ABNORMAL LOW (ref 4.22–5.81)
RDW: 15 % (ref 11.5–15.5)
WBC: 10.4 10*3/uL (ref 4.0–10.5)
nRBC: 0 % (ref 0.0–0.2)

## 2018-12-02 LAB — RENAL FUNCTION PANEL
Albumin: 2.6 g/dL — ABNORMAL LOW (ref 3.5–5.0)
Anion gap: 17 — ABNORMAL HIGH (ref 5–15)
BUN: 103 mg/dL — ABNORMAL HIGH (ref 6–20)
CO2: 23 mmol/L (ref 22–32)
Calcium: 9.4 mg/dL (ref 8.9–10.3)
Chloride: 94 mmol/L — ABNORMAL LOW (ref 98–111)
Creatinine, Ser: 12.93 mg/dL — ABNORMAL HIGH (ref 0.61–1.24)
GFR calc Af Amer: 4 mL/min — ABNORMAL LOW (ref >60)
GFR calc non Af Amer: 4 mL/min — ABNORMAL LOW (ref >60)
Glucose, Bld: 87 mg/dL (ref 70–99)
Phosphorus: 4 mg/dL (ref 2.5–4.6)
Potassium: 5.3 mmol/L — ABNORMAL HIGH (ref 3.5–5.1)
Sodium: 134 mmol/L — ABNORMAL LOW (ref 135–145)

## 2018-12-02 LAB — MAGNESIUM: Magnesium: 2.1 mg/dL (ref 1.7–2.4)

## 2018-12-02 MED ORDER — ONDANSETRON HCL 4 MG/2ML IJ SOLN
INTRAMUSCULAR | Status: AC
  Filled 2018-12-02: qty 2

## 2018-12-02 MED ORDER — ALPRAZOLAM 0.5 MG PO TABS: 0.5000 mg | ORAL_TABLET | Freq: Once | ORAL | Status: DC

## 2018-12-02 MED ORDER — LORAZEPAM 0.5 MG PO TABS
ORAL_TABLET | ORAL | Status: AC
  Filled 2018-12-02: qty 1

## 2018-12-02 MED ORDER — NEPRO/CARBSTEADY PO LIQD
237.0000 mL | Freq: Three times a day (TID) | ORAL | Status: DC | PRN
  Filled 2018-12-02: qty 237

## 2018-12-02 MED ORDER — BISACODYL 10 MG RE SUPP
10.0000 mg | Freq: Once | RECTAL | Status: AC
  Administered 2018-12-03: 10 mg via RECTAL
  Filled 2018-12-02: qty 1

## 2018-12-02 MED ORDER — HEPARIN SODIUM (PORCINE) 1000 UNIT/ML IJ SOLN
INTRAMUSCULAR | Status: AC
  Administered 2018-12-02: 5800 [IU] via INTRAVENOUS
  Filled 2018-12-02: qty 6

## 2018-12-02 NOTE — Plan of Care (Signed)
  Problem: Coping: Goal: Level of anxiety will decrease Outcome: Not Progressing   

## 2018-12-02 NOTE — Progress Notes (Signed)
[facility] Progress Note   Subjective:  Seen in room - ongoing L flank/back pain which is not relieved with current regimen. Says essentially got no sleep last night. Denies CP/dyspnea.   Objective Vitals:   12/01/18 1708 12/01/18 2041 12/02/18 0551 12/02/18 0936  BP: (!) 160/98 (!) 106/48 (!) 113/54 124/74  Pulse: 79 76 70 75  Resp: 18 16 17 18   Temp: 98.1 F (36.7 C) 99 F (37.2 C) 98.6 F (37 C) 98.8 F (37.1 C)  TempSrc: Axillary Oral Oral Oral  SpO2: 98% 98% 98% 97%  Weight:   120.5 kg   Height:       Physical Exam General: Chronically ill appearing man, NAD Heart: RRR; 3/6 systolic murmur Lungs: CTAB; no rales Abdomen: soft. L flank with perc nephrostomy drain - brownish/bloody drainage in bag Extremities: Trace LLE edema Dialysis Access: R femoral cath  Additional Objective Labs: Basic Metabolic Panel: Recent Labs  Lab 11/30/18 0453 12/01/18 0501 12/02/18 0639  NA 135 133* 134*  K 4.6 4.7 5.3*  CL 95* 95* 94*  CO2 25 21* 23  GLUCOSE 108* 107* 87  BUN 65* 86* 103*  CREATININE 9.39* 10.73* 12.93*  CALCIUM 8.4* 9.2 9.4  PHOS 2.4* 2.9 4.0   Liver Function Tests: Recent Labs  Lab 11/26/18 0138 11/27/18 0610  11/30/18 0453 12/01/18 0501 12/02/18 0639  AST 15 26  --   --   --   --   ALT 13 20  --   --   --   --   ALKPHOS 84 137*  --   --   --   --   BILITOT 1.1 2.1*  --   --   --   --   PROT 9.1* 8.2*  --   --   --   --   ALBUMIN 3.2* 2.8*   < > 2.4* 2.6* 2.6*   < > = values in this interval not displayed.   Recent Labs  Lab 11/26/18 0138  LIPASE 23   CBC: Recent Labs  Lab 11/26/18 0138  11/28/18 0031 11/29/18 1210 11/30/18 0453 12/01/18 0501 12/02/18 0639  WBC 12.1*   < > 10.7* 11.7* 9.5 13.3* 10.4  NEUTROABS 11.1*  --   --   --   --   --   --   HGB 10.6*   < > 10.1* 9.4* 8.7* 9.6* 9.0*  HCT 33.6*   < > 32.0* 29.4* 27.6* 29.7* 27.7*  MCV 97.7   < > 95.5 94.2 96.8 94.3 95.2  PLT 190   < > 145* 161 147* 184 181   < >  = values in this interval not displayed.   Blood Culture    Component Value Date/Time   SDES ABSCESS LEFT NEPHROSTOMY PLACEMENT 11/28/2018 1040   SPECREQUEST Normal 11/28/2018 1040   CULT  11/28/2018 1040    FEW FUSOBACTERIUM NUCLEATUM BETA LACTAMASE NEGATIVE Performed at [facility], [place] [address], [place], [place] [address]    REPTSTATUS PENDING 11/28/2018 1040   Studies/Results: Ct Abdomen Pelvis Wo Contrast  Result Date: 12/01/2018 CLINICAL DATA:  53 year old male with history of flank pain. EXAM: CT ABDOMEN AND PELVIS WITHOUT CONTRAST TECHNIQUE: Multidetector CT imaging of the abdomen and pelvis was performed following the standard protocol without IV contrast. COMPARISON:  CT the abdomen and pelvis 11/26/2018. FINDINGS: Lower chest: Areas of mild scarring are noted throughout the lung bases bilaterally. Dilatation of the pulmonic trunk (3.7 cm in diameter), concerning for  pulmonary arterial hypertension. Severe calcifications of the aortic valve and mitral annulus. Atherosclerotic calcifications in the left main, left anterior descending, left circumflex and right coronary arteries. Hepatobiliary: No definite suspicious cystic or solid hepatic lesions are confidently identified on today's noncontrast CT examination. Low-attenuation throughout the hepatic parenchyma, indicative of hepatic steatosis. Unenhanced appearance of the gallbladder is normal. Pancreas: No definite pancreatic mass or peripancreatic fluid collections or inflammatory changes are noted on today's noncontrast CT examination. Spleen: Unremarkable. Adrenals/Urinary Tract: Severe atrophy of the right kidney. Percutaneous nephrostomy tube in the left kidney with tip reformed in the left renal pelvis. Multiple nonobstructive calculi are noted within the collecting systems of both kidneys measuring up to 1 cm in diameter bilaterally. Low-attenuation lesion in the lower pole of the left kidney measuring 4.2 cm in diameter  incompletely characterized on today's non-contrast CT examination, but similar to the prior study and statistically likely to represent a cyst. In the proximal third of the left ureter there is a 4 mm calculus (coronal image 86 of series 6). There is profound thickening of the left ureter with surrounding inflammatory changes. Urinary bladder is completely decompressed. Right lower quadrant urostomy. Bilateral adrenal glands are normal in appearance. Stomach/Bowel: Unenhanced appearance of the stomach is normal. No pathologic dilatation of small bowel or colon. The appendix is not confidently identified and may be surgically absent. Regardless, there are no inflammatory changes noted adjacent to the cecum to suggest the presence of an acute appendicitis at this time. Vascular/Lymphatic: Aortic atherosclerosis. Vascular stent in the left common iliac vein. Right common femoral central venous catheter with tip terminating in the infra hepatic IVC. No lymphadenopathy noted in the abdomen or pelvis. No lymphadenopathy noted in the abdomen or pelvis. Reproductive: Prostate gland and seminal vesicles are unremarkable in appearance. Other: No significant volume of ascites.  No pneumoperitoneum. Musculoskeletal: There are no aggressive appearing lytic or blastic lesions noted in the visualized portions of the skeleton. IMPRESSION: 1. In addition to multiple nonobstructive calculi in the collecting systems of both kidneys there is a 4 mm calculus in the proximal third of the left ureter. Notably, there is diffuse soft tissue thickening associated with the left ureter with extensive surrounding inflammatory changes. This is presumably infectious/inflammatory in etiology, although infiltrative neoplasm along the course of the ureter is not entirely excluded. 2. Percutaneous left nephrostomy tube appears appropriately positioned. There is no left hydronephrosis. 3. Dilatation of the pulmonic trunk (3.7 cm in diameter),  concerning for pulmonary arterial hypertension. 4. Aortic atherosclerosis, in addition to left main and 3 vessel coronary artery disease. Please note that although the presence of coronary artery calcium documents the presence of coronary artery disease, the severity of this disease and any potential stenosis cannot be assessed on this non-gated CT examination. Assessment for potential risk factor modification, dietary therapy or pharmacologic therapy may be warranted, if clinically indicated. 5. Additional incidental findings, as above. Electronically Signed   By: Vinnie Langton M.D.   On: 12/01/2018 16:03   Kub  Result Date: 11/30/2018 CLINICAL DATA:  53 year old male with history of bloody nephrostomy tube output. EXAM: PORTABLE ABDOMEN - 1 VIEW COMPARISON:  No priors. FINDINGS: Nephrostomy tube projecting over the left retroperitoneum with properly reformed pigtail drainage catheter. Right femoral PermCath noted with tip extending into the intrahepatic IVC. The bowel gas pattern is normal. No radio-opaque calculi or other significant radiographic abnormality are seen. Vascular stent projecting over the left common and external iliac arteries. IMPRESSION: 1. Support apparatus, as  above. 2. Nonobstructive bowel gas pattern. 3. No pneumoperitoneum. Electronically Signed   By: Vinnie Langton M.D.   On: 11/30/2018 12:42   Medications: . cefTRIAXone (ROCEPHIN)  IV 1 g (12/01/18 1730)   . Chlorhexidine Gluconate Cloth  6 each Topical Daily  . famotidine  20 mg Oral Q1400  . feeding supplement (NEPRO CARB STEADY)  237 mL Oral BID BM  . levothyroxine  125 mcg Oral QAC breakfast  . LORazepam  1 mg Oral Daily  . polyethylene glycol  17 g Oral BID  . sodium chloride flush  5 mL Intracatheter Q8H  . sodium polystyrene  15 g Oral Once per day on Sun Sat  . traZODone  100 mg Oral QHS    Dialysis Orders: MWF at [facility] 4:45hr, 350/800, EDW 123kg (getting under), 2K/2.25Ca, TDC, heparin 3700 bolus -  Hectoral 68mcg IV q HD - Mircera 75mcg IV q 4 (last 11/22) - Venofer 100 x 10 ordered  Assessment/Plan: 1.L ureteral calculi/pyelonephritis:With moderate hydronephrosis and calyceal rupture. S/p L percutaneous nephrostromy tube placement by IR on 11/26. On IV Ceftriaxone. Bloody output in bag but clearing. Still in significant pain. Urology recommending titrating pain meds and outpatient follow up for further management.  2. ESRD:Continue HD on MWF schedule - for HD today. 3.HTN/volume:BPok, under previous outpatient EDW. Trace edema on exam, UF tomorrow as tolerated.   4. Anemia:Hgb9, not due for ESA yet. Will hold IV Fe while on IV antibiotics. 5. Secondary hyperparathyroidism:Corrected calcium high, hectorol reduced then held.Phos low, calcium acetate decreased then held as well on 11/27. Phos now 4 - may need to resume tomorrow. 6. Nutrition:Alb low, continue Nepro. 7. Spina bifida (neurogenic bladder)  [name], PA-C 12/02/2018, 9:49 AM  [facility] Pager: [phone]

## 2018-12-02 NOTE — Progress Notes (Signed)
Palliative Medicine RN Note: Consult noted for pain. Discussed with Dr Rowe Pavy, attending PMT physician. Unfortunately, PMT manages pain and symptoms related to life-limiting illnesses, and thus, we are unable to assist with [name]'s care.   Dr Rowe Pavy recommends that primary treatment team reach out to Dr Clydell Hakim for recommendations regarding pain management.  Consult order will be cancelled. Notified Dr Cyndia Skeeters and RN via secure chat.  Marjie Skiff Jmarion Christiano, RN, BSN, [facility] Palliative Medicine Team 12/02/2018 9:38 AM Office [phone]

## 2018-12-02 NOTE — H&P (View-Only) (Signed)
[facility] Consult: 12:04 PM 12/02/2018  LOS: 6 days    Referring Provider: Dr Cyndia Skeeters.   Primary Care Physician:  [name], MD  [facility] [facility]ologist: Althia Forts.    Reason for Consultation:  Left abdominal pain,    HPI: [name] is a 53 y.o. male.  PMH spinal bifida.  Neurogenic bladder, s/p ileal conduit at age 43.  Seizures.  End-stage renal disease.  Dialyzes Monday Wednesday Friday.  Innumerable dialysis vascular access procedures.  Hypothyroidism.  Anemia.  Ileostomy in the 1970s. Narcotic's dependent chronic pain. Previous colonoscopy in [place] 5 years ago but unable to locate any reports and has not been seen at either [facility] or [facility].  Dr. Darnell Level Cho's office also has no records of a colonoscopy.  But the patient recalls having difficulty completing the bowel prep which caused nausea, vomiting and abdominal pain.  Admitted a week ago with urologic issues.  2 weeks of sxs typical for kidney stone of left abdominal pain, radiating to pelvis, anorexia, dry heaves and, finally, fever.   Diagnosed with left ureteral stones and underwent  left ureteral stent placed for stones.  Urology feels nephrostomy tube is in good position, draining urine adequately yet patient continues to complain of pain.  Latest urologist recommended attending to constipation.  Pt has BM's ~ 1 x weekly, uses stool softeners but avoid laxatives due to resulting diarrhea.  Patient says this pain is not consistent with constipation.  He has been getting oral laxatives but has not had a bowel movement but he still is not eating or drinking much. Initial renal stone CT scan of 11/26/2018 made incidental note of circumferential, nonspecific rectal wall thickening.   12/01/2018 CTAP wo contrast port  makes no mention of proctitis.  Mention of no pathology in the stomach, small bowel, colon.  Appendix not confidently identified and may be surgically absent.,  No inflammatory changes in the region of the cecum to suggest acute appendicitis. The urostomy abscess drainage grew fusiform nucleatum.  Dr. Cyndia Skeeters concerned for colonic/rectal neoplasm.      Past Medical History:  Diagnosis Date  . Anemia   . Anxiety   . Constipation   . End stage renal failure on dialysis [facility])    [place]; MWF, on HD since 1997, exhausted upper ext access, and possibly LE access; cath dependent in R groin as of 06/2018  . GERD (gastroesophageal reflux disease)   . Grand mal seizure ([place]) X 4   last 1998 (02/29/2016)  . Heart murmur    no problems with per pt. - nephrologist and Dr. Coletta Memos  . History of blood transfusion 2000s   "I was having blood loss; never found out from where"  . History of kidney stones   . Hypertension    hx of - has not taken bp meds in over 2 years  . Hypothyroidism   . Insomnia   . Migraine    "due to my BP in my late 1990s; none since" (02/29/2016)  . Nonhealing surgical wound    of the  left arteriovenous graft  . Pneumonia    x 2  . Shortness of breath    rarely  . Spina bifida [facility])     Past Surgical History:  Procedure Laterality Date  . AMPUTATION Left 12/11/2017   Procedure: Left Great Toe Amputation;  Surgeon: [name], MD;  Location: [place];  Service: Orthopedics;  Laterality: Left;  . APPENDECTOMY    . ARTERIOVENOUS GRAFT PLACEMENT Left 10/16/2012   left femoral goretex graft         Dr Donnetta Hutching  . AV FISTULA PLACEMENT Left 06/27/2012   Procedure: EXPLORATORY LEFT THY-GRAFT PSEUDO-ANEURYSM;  Surgeon: Conrad Brushy Creek, MD;  Location: [place];  Service: Vascular;  Laterality: Left;  Revision of left Arteriovenus gortex graft in thigh.  . COLONOSCOPY    . I&D EXTREMITY Left 02/29/2016   Procedure: DEBRIDEMENT LEFT THIGH WOUND;  Surgeon: Waynetta Sandy, MD;   Location: [place];  Service: Vascular;  Laterality: Left;  . ILEOSTOMY  1970s  . INCISION AND DRAINAGE Left 10/16/2012   Procedure: INCISION AND Debridement left thigh graft;  Surgeon: Rosetta Posner, MD;  Location: [place];  Service: Vascular;  Laterality: Left;  . INSERTION OF DIALYSIS CATHETER    . INSERTION OF DIALYSIS CATHETER  01/14/2016   Procedure: INSERTION OF DIALYSIS CATHETER;  Surgeon: Waynetta Sandy, MD;  Location: [place];  Service: Vascular;;  . INSERTION OF DIALYSIS CATHETER Right 08/07/2017   Procedure: INSERTION OF DIALYSIS CATHETER;  Surgeon: Waynetta Sandy, MD;  Location: [place];  Service: Vascular;  Laterality: Right;  . INSERTION OF DIALYSIS CATHETER Right 12/21/2017   Procedure: RIGHT FEMORAL DIALYSIS CATHETER EXCHANGE;  Surgeon: Marty Heck, MD;  Location: [place];  Service: Vascular;  Laterality: Right;  . INSERTION OF ILIAC STENT Left 01/14/2016   Procedure: INSERTION OF ILIAC STENT;  Surgeon: Waynetta Sandy, MD;  Location: [place];  Service: Vascular;  Laterality: Left;  . INTRAOPERATIVE ARTERIOGRAM Left 01/14/2016   Procedure: INTRA OPERATIVE ARTERIOGRAM;  Surgeon: Waynetta Sandy, MD;  Location: [place];  Service: Vascular;  Laterality: Left;  . IR NEPHROSTOMY PLACEMENT LEFT  11/28/2018  . LITHOTRIPSY  x 3   "& a laser treatment" (02/29/2016)  . NEPHROSTOMY Bilateral 1968  . PATELLAR TENDON REPAIR Right 1990s   "big incision"  . PERIPHERAL VASCULAR CATHETERIZATION  01/14/2016   Procedure: A/V SHUNTOGRAM;  Surgeon: Waynetta Sandy, MD;  Location: [place];  Service: Vascular;;  . REVISION OF ARTERIOVENOUS GORETEX GRAFT Left 10/16/2012   Procedure: REVISION OF LEFT FEMORAL LOOP ARTERIOVENOUS GORETEX GRAFT;  Surgeon: Rosetta Posner, MD;  Location: [place];  Service: Vascular;  Laterality: Left;  . REVISION OF ARTERIOVENOUS GORETEX GRAFT Left 02/11/2015   Procedure: EXCISION OF SMALL SEGMENT OF EXPOSED LEFT THIGH NON FUNCTIONING   ARTERIOVENOUS GORETEX GRAFT;  Surgeon: Mal Misty, MD;  Location: [place];  Service: Vascular;  Laterality: Left;  . REVISION OF ARTERIOVENOUS GORETEX GRAFT Left 01/14/2016   Procedure: REVISION OF ARTERIOVENOUS GORETEX GRAFT;  Surgeon: Waynetta Sandy, MD;  Location: [place];  Service: Vascular;  Laterality: Left;  . REVISION OF ARTERIOVENOUS GORETEX GRAFT Left 02/29/2016   thigh/pt report  . REVISION OF ARTERIOVENOUS GORETEX GRAFT Left 02/29/2016   Procedure: POSSIBLE REVISION OF LEFT THIGH ARTERIOVENOUS GORETEX GRAFT;  Surgeon: Waynetta Sandy, MD;  Location: [place];  Service: Vascular;  Laterality: Left;  . TEE WITHOUT CARDIOVERSION N/A 12/14/2017   Procedure: TRANSESOPHAGEAL ECHOCARDIOGRAM (TEE);  Surgeon: Acie Fredrickson,  Wonda Cheng, MD;  Location: [place];  Service: Cardiovascular;  Laterality: N/A;  . THROMBECTOMY AND REVISION OF ARTERIOVENTOUS (AV) GORETEX  GRAFT Left 06/12/2018   Procedure: Removal of infected left thigh graft;  Surgeon: Rosetta Posner, MD;  Location: [place];  Service: Vascular;  Laterality: Left;  . THROMBECTOMY W/ EMBOLECTOMY Left 01/14/2016   Procedure: THROMBECTOMY ARTERIOVENOUS GORE-TEX Left thigh GRAFT;  Surgeon: Waynetta Sandy, MD;  Location: [place];  Service: Vascular;  Laterality: Left;  . TONGUE SURGERY  ~ 1990   tongue-tie release   . ULTRASOUND GUIDANCE FOR VASCULAR ACCESS Right 08/07/2017   Procedure: ULTRASOUND GUIDANCE FOR VASCULAR CANNULATION RIGHT FEMORAL VEIN AND LEFT AV FEMORAL GRAFT;  Surgeon: Waynetta Sandy, MD;  Location: [place];  Service: Vascular;  Laterality: Right;  . UPPER EXTREMITY VENOGRAPHY Bilateral 08/09/2017   Procedure: UPPER EXTREMITY VENOGRAPHY;  Surgeon: Serafina Mitchell, MD;  Location: [place] [facility];  Service: Cardiovascular;  Laterality: Bilateral;  . VENOGRAM N/A 09/20/2017   Procedure: RIGHT COMMON FEMORAL ARTERY EXPLORATION.  CANNULATION RIGHT COMMON FEMORAL VEIN. VENOGRAM CENTRAL ULTRA SOUND GUIDED RIGHT  FEMORAL VEIN TIMES TWO.;  Surgeon: Waynetta Sandy, MD;  Location: [place];  Service: Vascular;  Laterality: N/A;  . WOUND DEBRIDEMENT Left 02/29/2016   thigh    Prior to Admission medications   Medication Sig Start Date End Date Taking? Authorizing Provider  aspirin 325 MG EC tablet Take 325 mg by mouth daily.   Yes [provider]  calcium acetate (PHOSLO) 667 MG capsule Take 3-4 capsules (2,001-2,668 mg total) by mouth See admin instructions. Take 2708 mg by mouth 3 times daily with meals, take 2001 mg by mouth with snacks Patient taking differently: Take 2,001-2,668 mg by mouth See admin instructions. Take 2,668 mg by mouth three times a day with meals and 2,001 mg with optional snacks 01/14/18  Yes Hennie Duos, MD  clopidogrel (PLAVIX) 75 MG tablet Take 1 tablet (75 mg total) by mouth daily. Patient taking differently: Take 75 mg by mouth at bedtime.  01/14/18  Yes Hennie Duos, MD  famotidine (PEPCID) 40 MG tablet Take 1 tablet (40 mg total) by mouth daily at 2 PM. 01/14/18  Yes Hennie Duos, MD  fluticasone [facility]) 50 MCG/ACT nasal spray Place 2 sprays into both nostrils as needed for allergies or rhinitis (congestion). Patient taking differently: Place 2 sprays into both nostrils as needed for allergies or rhinitis (or congestion).  01/14/18  Yes Hennie Duos, MD  levothyroxine (SYNTHROID, LEVOTHROID) 125 MCG tablet Take 1 tablet (125 mcg total) by mouth daily before breakfast. 01/14/18  Yes Hennie Duos, MD  LORazepam (ATIVAN) 1 MG tablet Take 1 mg by mouth daily.   Yes [provider]  Nutritional Supplements (NEPRO PO) Take 1 Can by mouth 2 (two) times daily.    Yes [provider]  oxyCODONE-acetaminophen (PERCOCET) 10-325 MG tablet Take 1 tablet by mouth every 6 (six) hours as needed for pain. 06/14/18  Yes Dagoberto Ligas, PA-C  promethazine (PHENERGAN) 25 MG tablet Take 1 tablet (25 mg total) by mouth 2 (two) times daily as  needed for nausea or vomiting. 01/14/18  Yes Hennie Duos, MD  sodium polystyrene (KAYEXALATE) powder Take 15 g by mouth See admin instructions. Take 15 g (mixed in water) by mouth on Saturdays and Sundays 01/14/18  Yes Hennie Duos, MD  traZODone (DESYREL) 100 MG tablet Take 1 tablet (100 mg total) by mouth at bedtime.  01/14/18  Yes Hennie Duos, MD  zolpidem (AMBIEN) 10 MG tablet Take 1 tablet (10 mg total) by mouth at bedtime. 01/14/18  Yes Hennie Duos, MD  aspirin EC 81 MG tablet Take 1 tablet (81 mg total) by mouth daily. 12/01/18 03/01/19  Mercy Riding, MD  cephALEXin (KEFLEX) 500 MG capsule Take 1 capsule (500 mg total) by mouth 2 (two) times daily for 10 doses. Take after dialysis on dialysis days. 12/01/18 12/06/18  Mercy Riding, MD  methocarbamol (ROBAXIN) 500 MG tablet Take 1 tablet (500 mg total) by mouth every 6 (six) hours as needed for muscle spasms. Patient not taking: Reported on 11/26/2018 01/14/18   Hennie Duos, MD  midodrine (PROAMATINE) 5 MG tablet Take 1 tablet (5 mg total) by mouth 3 (three) times daily with meals. Patient not taking: Reported on 11/26/2018 01/14/18   Hennie Duos, MD  Polyethylene Glycol 3350 POWD Take 17 g by mouth daily as needed. [place] 1 CAPFUL (17GM) IN 4-8OZ. OF WATER AND DRINK BY MOUTH DAILY AS NEEDED FOR MILD CONSTIPATION Patient not taking: Reported on 11/26/2018 01/14/18   Hennie Duos, MD  sodium zirconium cyclosilicate (LOKELMA) 10 g PACK packet Take 10 g by mouth daily. TAKE 10GM PACK ON OFF DIALYSIS DAYS ON TUESDAY ON THURSDAY ON SUNDAY Patient not taking: Reported on 11/26/2018 01/14/18   Hennie Duos, MD    Scheduled Meds: . Chlorhexidine Gluconate Cloth  6 each Topical Daily  . famotidine  20 mg Oral Q1400  . feeding supplement (NEPRO CARB STEADY)  237 mL Oral BID BM  . levothyroxine  125 mcg Oral QAC breakfast  . LORazepam  1 mg Oral Daily  . polyethylene glycol  17 g Oral BID  . sodium chloride flush  5  mL Intracatheter Q8H  . sodium polystyrene  15 g Oral Once per day on Sun Sat  . traZODone  100 mg Oral QHS   Infusions: . cefTRIAXone (ROCEPHIN)  IV 1 g (12/01/18 1730)   PRN Meds: acetaminophen **OR** acetaminophen, fluticasone, heparin, HYDROmorphone (DILAUDID) injection, ondansetron (ZOFRAN) IV, [DISCONTINUED] oxyCODONE-acetaminophen **AND** oxyCODONE, oxyCODONE-acetaminophen, zolpidem   Allergies as of 11/25/2018 - Review Complete 11/25/2018  Allergen Reaction Noted  . Furadantin [nitrofurantoin] Other (See Comments) 06/27/2012  . Mandelamine [methenamine] Other (See Comments) 06/27/2012  . Noroxin [norfloxacin] Other (See Comments) 06/27/2012  . [place] Nausea Only 02/08/2015  . Contrast media [iodinated diagnostic agents] Nausea And Vomiting 06/27/2012  . Hydrocodone Other (See Comments) 11/05/2012  . Metrizamide Nausea And Vomiting 02/08/2015  . Sulfa antibiotics Cough 06/24/2012  . Sulfur Cough 02/08/2015    Family History  Problem Relation Age of Onset  . Diabetes Mother   . Hypertension Mother   . Heart disease Mother        before age 68  . Diabetes Father   . Heart attack Father        X's 3  . Diabetes Sister   . Bipolar disorder Sister     Social History   Socioeconomic History  . Marital status: Single    Spouse name: Not on file  . Number of children: 0  . Years of education: Not on file  . Highest education level: Not on file  Occupational History  . Occupation: Disabled  Social Needs  . Financial resource strain: Not on file  . Food insecurity    Worry: Not on file    Inability: Not on file  . Transportation needs  Medical: Not on file    Non-medical: Not on file  Tobacco Use  . Smoking status: Never Smoker  . Smokeless tobacco: Never Used  Substance and Sexual Activity  . Alcohol use: Never    Alcohol/week: 0.0 standard drinks    Frequency: Never    Comment: 02/29/2016 "nothing since  the mid 1990s  . Drug use: Yes    Types:  Marijuana    Comment: 02/29/2016 "nothing since ~ 1995"  . Sexual activity: Yes  Lifestyle  . Physical activity    Days per week: Not on file    Minutes per session: Not on file  . Stress: Not on file  Relationships  . Social Herbalist on phone: Not on file    Gets together: Not on file    Attends religious service: Not on file    Active member of club or organization: Not on file    Attends meetings of clubs or organizations: Not on file    Relationship status: Not on file  . Intimate partner violence    Fear of current or ex partner: Not on file    Emotionally abused: Not on file    Physically abused: Not on file    Forced sexual activity: Not on file  Other Topics Concern  . Not on file  Social History Narrative  . Not on file    REVIEW OF SYSTEMS: Constitutional: Feels tired, exhausted. ENT:  No nose bleeds Pulm: No trouble breathing.  No cough. CV:  No palpitations, no LE edema.  No angina GU:  No hematuria, no frequency GI: See HPI.  Generally does not have much trouble with heartburn or indigestion.  Stable weekly bowel movements.  No blood in his stools. Heme: Denies unusual bleeding or bruising. Transfusions: None in epic records. Neuro: Chronic pain managed with chronic narcotics.  He has pain from previous shoulder fractures, from kidney stones, from left ankle injury.  No headaches, no peripheral tingling or numbness Derm:  No itching, no rash or sores.  Endocrine:  No sweats or chills.  No polyuria or dysuria Immunization: Reviewed.  I do not see records of a current flu shot. Travel:  None beyond local counties in last few months.    PHYSICAL EXAM: Vital signs in last 24 hours: Vitals:   12/02/18 0551 12/02/18 0936  BP: (!) 113/54 124/74  Pulse: 70 75  Resp: 17 18  Temp: 98.6 F (37 C) 98.8 F (37.1 C)  SpO2: 98% 97%   Wt Readings from Last 3 Encounters:  12/02/18 120.5 kg  07/01/18 122.9 kg  06/15/18 124.2 kg    General: Somewhat  peeved obese, generally unwell appearing but not acutely ill-appearing WM. Head: No facial asymmetry or swelling.  No signs of head trauma. Eyes: No scleral icterus.  No conjunctival pallor.  EOMI. Ears: No hearing loss Nose: No congestion or discharge Mouth: Oral mucosa pink, moist, clear.  Tongue midline.  Fair dentition though there is a lot of dental plaque.  Some missing teeth. Neck: No JVD, no masses, no thyromegaly.  Well-healed scar at the base of his neck related to previous parathyroidectomy. Lungs: Clear bilaterally.  No labored breathing or cough Heart: RRR.  No MRG.  S1, S2 present Abdomen: Obese.  Soft.  Tender without guarding or rebound on the left abdomen.  No masses, no HSM, no bruits.  Ileostomy/ileal conduit in position on right mid/lower abdomen.  Nephrostomy tube in position on left rear flank and draining  opaque bloody, somewhat purulent looking liquid.  Caveat is that the patient was unable to lie down for the exam and was examined while leaning on his left elbow from a sitting position..   Rectal: Deferred Musc/Skeltl: No joint redness, swelling or gross deformity.  Normal difficulty in raising himself up from the lounge chair, standing and swiveling onto the bed. Extremities: No CCE. Neurologic: Oriented x3.  Fully alert.  Appropriate.  Moves all 4 limbs, strength not tested. Skin: No rashes, no sores, no telangiectasia Nodes: No cervical adenopathy Psych: Cooperative.  Intake/Output from previous day: 11/29 0701 - 11/30 0700 In: 1185 [P.O.:1080; IV Piggyback:100] Out: 100 [Urine:100] Intake/Output this shift: Total I/O In: 300 [P.O.:300] Out: -   LAB RESULTS: Recent Labs    11/30/18 0453 12/01/18 0501 12/02/18 0639  WBC 9.5 13.3* 10.4  HGB 8.7* 9.6* 9.0*  HCT 27.6* 29.7* 27.7*  PLT 147* 184 181   BMET Lab Results  Component Value Date   NA 134 (L) 12/02/2018   NA 133 (L) 12/01/2018   NA 135 11/30/2018   K 5.3 (H) 12/02/2018   K 4.7 12/01/2018    K 4.6 11/30/2018   CL 94 (L) 12/02/2018   CL 95 (L) 12/01/2018   CL 95 (L) 11/30/2018   CO2 23 12/02/2018   CO2 21 (L) 12/01/2018   CO2 25 11/30/2018   GLUCOSE 87 12/02/2018   GLUCOSE 107 (H) 12/01/2018   GLUCOSE 108 (H) 11/30/2018   BUN 103 (H) 12/02/2018   BUN 86 (H) 12/01/2018   BUN 65 (H) 11/30/2018   CREATININE 12.93 (H) 12/02/2018   CREATININE 10.73 (H) 12/01/2018   CREATININE 9.39 (H) 11/30/2018   CALCIUM 9.4 12/02/2018   CALCIUM 9.2 12/01/2018   CALCIUM 8.4 (L) 11/30/2018   LFT Recent Labs    11/30/18 0453 12/01/18 0501 12/02/18 0639  ALBUMIN 2.4* 2.6* 2.6*   PT/INR Lab Results  Component Value Date   INR 1.0 12/01/2018   INR 1.2 11/26/2018   INR 1.14 12/13/2017   Hepatitis Panel No results for input(s): HEPBSAG, HCVAB, HEPAIGM, HEPBIGM in the last 72 hours. C-Diff No components found for: CDIFF Lipase     Component Value Date/Time   LIPASE 23 11/26/2018 0138    Drugs of Abuse  No results found for: LABOPIA, COCAINSCRNUR, LABBENZ, AMPHETMU, THCU, LABBARB   RADIOLOGY STUDIES: Ct Abdomen Pelvis Wo Contrast  Result Date: 12/01/2018 CLINICAL DATA:  53 year old male with history of flank pain. EXAM: CT ABDOMEN AND PELVIS WITHOUT CONTRAST TECHNIQUE: Multidetector CT imaging of the abdomen and pelvis was performed following the standard protocol without IV contrast. COMPARISON:  CT the abdomen and pelvis 11/26/2018. FINDINGS: Lower chest: Areas of mild scarring are noted throughout the lung bases bilaterally. Dilatation of the pulmonic trunk (3.7 cm in diameter), concerning for pulmonary arterial hypertension. Severe calcifications of the aortic valve and mitral annulus. Atherosclerotic calcifications in the left main, left anterior descending, left circumflex and right coronary arteries. Hepatobiliary: No definite suspicious cystic or solid hepatic lesions are confidently identified on today's noncontrast CT examination. Low-attenuation throughout the hepatic  parenchyma, indicative of hepatic steatosis. Unenhanced appearance of the gallbladder is normal. Pancreas: No definite pancreatic mass or peripancreatic fluid collections or inflammatory changes are noted on today's noncontrast CT examination. Spleen: Unremarkable. Adrenals/Urinary Tract: Severe atrophy of the right kidney. Percutaneous nephrostomy tube in the left kidney with tip reformed in the left renal pelvis. Multiple nonobstructive calculi are noted within the collecting systems of both kidneys measuring up  to 1 cm in diameter bilaterally. Low-attenuation lesion in the lower pole of the left kidney measuring 4.2 cm in diameter incompletely characterized on today's non-contrast CT examination, but similar to the prior study and statistically likely to represent a cyst. In the proximal third of the left ureter there is a 4 mm calculus (coronal image 86 of series 6). There is profound thickening of the left ureter with surrounding inflammatory changes. Urinary bladder is completely decompressed. Right lower quadrant urostomy. Bilateral adrenal glands are normal in appearance. Stomach/Bowel: Unenhanced appearance of the stomach is normal. No pathologic dilatation of small bowel or colon. The appendix is not confidently identified and may be surgically absent. Regardless, there are no inflammatory changes noted adjacent to the cecum to suggest the presence of an acute appendicitis at this time. Vascular/Lymphatic: Aortic atherosclerosis. Vascular stent in the left common iliac vein. Right common femoral central venous catheter with tip terminating in the infra hepatic IVC. No lymphadenopathy noted in the abdomen or pelvis. No lymphadenopathy noted in the abdomen or pelvis. Reproductive: Prostate gland and seminal vesicles are unremarkable in appearance. Other: No significant volume of ascites.  No pneumoperitoneum. Musculoskeletal: There are no aggressive appearing lytic or blastic lesions noted in the visualized  portions of the skeleton. IMPRESSION: 1. In addition to multiple nonobstructive calculi in the collecting systems of both kidneys there is a 4 mm calculus in the proximal third of the left ureter. Notably, there is diffuse soft tissue thickening associated with the left ureter with extensive surrounding inflammatory changes. This is presumably infectious/inflammatory in etiology, although infiltrative neoplasm along the course of the ureter is not entirely excluded. 2. Percutaneous left nephrostomy tube appears appropriately positioned. There is no left hydronephrosis. 3. Dilatation of the pulmonic trunk (3.7 cm in diameter), concerning for pulmonary arterial hypertension. 4. Aortic atherosclerosis, in addition to left main and 3 vessel coronary artery disease. Please note that although the presence of coronary artery calcium documents the presence of coronary artery disease, the severity of this disease and any potential stenosis cannot be assessed on this non-gated CT examination. Assessment for potential risk factor modification, dietary therapy or pharmacologic therapy may be warranted, if clinically indicated. 5. Additional incidental findings, as above. Electronically Signed   By: Vinnie Langton M.D.   On: 12/01/2018 16:03     IMPRESSION:   *   Proctitis visible on renal stone CT.  This along with growth of fusiformbacterium nucleatum from the left nephrostomy tube raises question of colonic neoplasia At baseline patient moves his bowels once a week and this is been his habit for many years. Unable to locate records, confirmed colonoscopy patient says was performed 5 years ago.  *    Ureteral stone, status post left nephrostomy tube.  Continues with left abdominal pain which is his biggest complaint at this point  *    Ileal conduit placed 1968.  *    Chronic pain, narcotics requiring.    *   ESRD.  *    Hyperkalemia, hyponatremia.  *    Normocytic anemia.  Acute on chronic.  Baseline Hgb  9 to 10.5 10 months ago. Currently has dropped from 10.6 to 9.  Suspect blood loss from the nephrostomy tube, ureteral stone is a source of blood loss in addition to anemia from chronic kidney disease.    PLAN:     *   Per Dr Havery Moros.  Of note, patient vividly recalls not tolerating colonoscopy bowel prep and considers bowel prep and "  allergy".  So I am not sure were going to convince him to undergo colonoscopy but patient may be amenable to flexible sigmoidoscopy.   Azucena Freed  12/02/2018, 12:04 PM Phone [phone]

## 2018-12-02 NOTE — Care Management Important Message (Signed)
Important Message  Patient Details  Name: [name] MRN: [id number] Date of Birth: 02-Jan-1966   Medicare Important Message Given:  Yes     [name] Montine Circle 12/02/2018, 3:47 PM

## 2018-12-02 NOTE — Progress Notes (Signed)
PROGRESS NOTE  [name] [id number] DOB: 1965/09/19   PCP: [name], MD  Patient is from: Home  DOA: 11/25/2018 LOS: 6  Brief Narrative / Interim history: 53 year old male with history of ESRD on HD MWF, spinal bifid, neurogenic bladder/ileal conduit, seizures, migraine headache, HTN/HoTN, kidney stones, GERD and anxiety presenting with left flank pain on 11/25/2018 and admitted for left moderate hydronephrosis, calyceal rupture, nonobstructing bilateral nephrolithiasis and developing pyelonephritis as noted on CT. Nephrology and urology consulted.   Patient was started on IV ceftriaxone for developing pyelonephritis.  Urology recommended left percutaneous nephrostomy by IR, and outpatient follow-up.  Seen by IR 11/26/2018.  Per IR note, declined percutaneous nephrostomy, asking for nephrectomy. However, he later agreed to nephrostomy tube placement.  IR reconsulted 11/27/2018.  Underwent left nephrostomy tube placement on 11/28/2018. Nephrostomy output minimal but with dark red urine. 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 Patient continues to endorse severe left flank pain.  Urology reconsulted and recommended adjustment of his pain medications, treating his constipation,  continuing appropriate antibiotics and outpatient follow-up to discuss further plan.  Unfortunately, pain was not well controlled with high-dose opiates including IV Dilaudid and Percocet.  CT abdomen and pelvis without contrast obtained on 12/01/2018 and revealed previously noted multiple nonobstructing calculi in the collecting systems of both kidneys, 4 mm calculus in the proximal third of left ureter and diffuse soft tissue thickening associated with the left ureter with extensive surrounding inflammatory changes and appropriately positioned PCN.  Urology reconsulted 12/02/2018 again  Subjective: Continues to endorse significant left flank pain.  Pain is relieved after  IV Dilaudid but returns when the Dilaudid wears off.  He is frustrated about lack of clear answer and solution for his pain. He is also apologetic about his behavior stating that I was in a lot of pain. He complains when RN gave him his pills with water stating he does not take his medication with plain water.   Objective: Vitals:   12/01/18 1708 12/01/18 2041 12/02/18 0551 12/02/18 0936  BP: (!) 160/98 (!) 106/48 (!) 113/54 124/74  Pulse: 79 76 70 75  Resp: 18 16 17 18   Temp: 98.1 F (36.7 C) 99 F (37.2 C) 98.6 F (37 C) 98.8 F (37.1 C)  TempSrc: Axillary Oral Oral Oral  SpO2: 98% 98% 98% 97%  Weight:   120.5 kg   Height:        Intake/Output Summary (Last 24 hours) at 12/02/2018 1046 Last data filed at 12/02/2018 0900 Gross per 24 hour  Intake 1365 ml  Output 100 ml  Net 1265 ml   Filed Weights   11/29/18 1806 11/30/18 0500 12/02/18 0551  Weight: 120.5 kg 120.6 kg 120.5 kg    Examination:  GENERAL: No acute distress.  Appears well.  HEENT: MMM.  Vision and hearing grossly intact.  NECK: Supple.  No apparent JVD.  RESP:  No IWOB. Good air movement bilaterally. CVS:  RRR. Heart sounds normal.  ABD/GI/GU: Bowel sounds present. Soft. Non tender.  Small amount of dark red urine in nephrostomy bag. MSK/EXT:  Moves extremities. No apparent deformity or edema.  SKIN: Old abdominal surgical scar. NEURO: Awake, alert and oriented appropriately.  No apparent focal neuro deficit.  CT abdomen and pelvis on 12/01/2018 CT abdomen and pelvis without contrast obtained on 12/01/2018 and revealed previously noted multiple nonobstructing calculi in the collecting systems of both kidneys, 4 mm calculus in the proximal third of left ureter and diffuse soft tissue thickening associated with  the left ureter with extensive surrounding inflammatory changes, stable cyst and appropriately positioned PCN.  Assessment & Plan: Left flank pain/left moderate hydronephrosis/possible left calyceal  rupture/nonobstructing bilateral nephrolithiasis/developing left pyelonephritis.  Repeat CT abdomen and pelvis on 12/01/2018 as above. -Urology consulted and recommended PCN by IR-initially patient refused but agreed later. -Percutaneous nephrostomy tube placed by IR on 11/28/2018-bloody output. -Continue IV ceftriaxone 11/23>> -Continue current dose of Dilaudid and opiates-okay from nephrology standpoint. -Palliative care consulted for pain management but recommended consulting Dr. Clydell Hakim -Discussed CT finding with urology, Dr. Jeffie Pollock who will convey to Dr. Claudia Desanctis and Dr. Louis Meckel -Senokot and MiraLAX twice daily as needed for constipation  Anemia of chronic disease: has dark red output in nephrostomy bag.  H&H stable. -Holding Plavix and aspirin -Monitor H&H  ESRD on HD MWF Bone mineral disorder Electrolyte abnormalities -Nephrology following.   Essential hypertension/hypotension: Normotensive -Continue midodrine  Anxiety/insomnia/chronic pain syndrome -Continue home Ativan, trazodone and Ambien -Pain control as above.  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  History of peripheral arterial disease -Holding home Plavix and aspirin in the setting of hematuria.  Thrombocytopenia: Resolved.  Hematuria: H&H stable. -Continue holding Plavix and aspirin.  Proctitis: Noted on CT renal stone study on 11/26/2018.  Patient has no symptoms to suggest proctitis.  Reports normal colonoscopy about 5 years ago. -Will curbside GI               DVT prophylaxis: SCD Code Status: Full code Family Communication: Patient and/or RN. Available if any question. Disposition Plan: Remains inpatient due to severe left flank pain Consultants: Urology, IR and nephrology  Procedures:  11/28/2018-percutaneous nephrostomy tube placement  Microbiology  summarized: [id number] screen negative MRSA PCR negative Nephrostomy drain culture beta-lactamase negative Fusobacterium nucleatum.  Sch Meds:  Scheduled Meds:  Chlorhexidine Gluconate Cloth  6 each Topical Daily   famotidine  20 mg Oral Q1400   feeding supplement (NEPRO CARB STEADY)  237 mL Oral BID BM   levothyroxine  125 mcg Oral QAC breakfast   LORazepam  1 mg Oral Daily   polyethylene glycol  17 g Oral BID   sodium chloride flush  5 mL Intracatheter Q8H   sodium polystyrene  15 g Oral Once per day on Sun Sat   traZODone  100 mg Oral QHS   Continuous Infusions:  cefTRIAXone (ROCEPHIN)  IV 1 g (12/01/18 1730)   PRN Meds:.acetaminophen **OR** acetaminophen, fluticasone, heparin, HYDROmorphone (DILAUDID) injection, ondansetron (ZOFRAN) IV, [DISCONTINUED] oxyCODONE-acetaminophen **AND** oxyCODONE, oxyCODONE-acetaminophen, zolpidem  Antimicrobials: Anti-infectives (From admission, onward)   Start     Dose/Rate Route Frequency Ordered Stop   12/01/18 1600  cefTRIAXone (ROCEPHIN) 2 g in sodium chloride 0.9 % 100 mL IVPB  Status:  Discontinued     2 g 200 mL/hr over 30 Minutes Intravenous Every 24 hours 12/01/18 1510 12/01/18 1511   12/01/18 1600  cefTRIAXone (ROCEPHIN) 1 g in sodium chloride 0.9 % 100 mL IVPB     1 g 200 mL/hr over 30 Minutes Intravenous Every 24 hours 12/01/18 1511     12/01/18 0000  cephALEXin (KEFLEX) 500 MG capsule     500 mg Oral 2 times daily 12/01/18 1021 12/06/18 2359   11/26/18 1700  cefTRIAXone (ROCEPHIN) 1 g in sodium chloride 0.9 % 100 mL IVPB  Status:  Discontinued     1 g 200 mL/hr over 30 Minutes Intravenous Every 24  hours 11/26/18 1543 12/01/18 1436       I have personally reviewed the following labs and images: CBC: Recent Labs  Lab 11/26/18 0138  11/28/18 0031 11/29/18 1210 11/30/18 0453 12/01/18 0501 12/02/18 0639  WBC 12.1*   < > 10.7* 11.7* 9.5 13.3* 10.4  NEUTROABS 11.1*  --   --   --   --   --   --   HGB 10.6*   < >  10.1* 9.4* 8.7* 9.6* 9.0*  HCT 33.6*   < > 32.0* 29.4* 27.6* 29.7* 27.7*  MCV 97.7   < > 95.5 94.2 96.8 94.3 95.2  PLT 190   < > 145* 161 147* 184 181   < > = values in this interval not displayed.   BMP &GFR Recent Labs  Lab 11/28/18 0031 11/29/18 1210 11/30/18 0453 12/01/18 0501 12/02/18 0639  NA 132* 133* 135 133* 134*  K 3.8 4.3 4.6 4.7 5.3*  CL 92* 92* 95* 95* 94*  CO2 22 23 25  21* 23  GLUCOSE 108* 140* 108* 107* 87  BUN 63* 93* 65* 86* 103*  CREATININE 11.10* 13.08* 9.39* 10.73* 12.93*  CALCIUM 9.1 8.6* 8.4* 9.2 9.4  MG 1.9 2.1 2.0 2.0 2.1  PHOS 2.7 2.1* 2.4* 2.9 4.0   Estimated Creatinine Clearance: 8.7 mL/min (A) (by C-G formula based on SCr of 12.93 mg/dL (H)). Liver & Pancreas: Recent Labs  Lab 11/26/18 0138 11/27/18 0610 11/28/18 0031 11/29/18 1210 11/30/18 0453 12/01/18 0501 12/02/18 0639  AST 15 26  --   --   --   --   --   ALT 13 20  --   --   --   --   --   ALKPHOS 84 137*  --   --   --   --   --   BILITOT 1.1 2.1*  --   --   --   --   --   PROT 9.1* 8.2*  --   --   --   --   --   ALBUMIN 3.2* 2.8* 2.7* 2.5* 2.4* 2.6* 2.6*   Recent Labs  Lab 11/26/18 0138  LIPASE 23   No results for input(s): AMMONIA in the last 168 hours. Diabetic: No results for input(s): HGBA1C in the last 72 hours. No results for input(s): GLUCAP in the last 168 hours. Cardiac Enzymes: No results for input(s): CKTOTAL, CKMB, CKMBINDEX, TROPONINI in the last 168 hours. No results for input(s): PROBNP in the last 8760 hours. Coagulation Profile: Recent Labs  Lab 11/26/18 0815 12/01/18 0501  INR 1.2 1.0   Thyroid Function Tests: No results for input(s): TSH, T4TOTAL, FREET4, T3FREE, THYROIDAB in the last 72 hours. Lipid Profile: No results for input(s): CHOL, HDL, LDLCALC, TRIG, CHOLHDL, LDLDIRECT in the last 72 hours. Anemia Panel: No results for input(s): VITAMINB12, FOLATE, FERRITIN, TIBC, IRON, RETICCTPCT in the last 72 hours. Urine analysis:    Component Value  Date/Time   COLORURINE RED (A) 07/31/2010 0410   APPEARANCEUR TURBID (A) 07/31/2010 0410   LABSPEC 1.044 (H) 07/31/2010 0410   PHURINE 6.5 07/31/2010 0410   GLUCOSEU 100 (A) 07/31/2010 0410   HGBUR LARGE (A) 07/31/2010 0410   BILIRUBINUR LARGE (A) 07/31/2010 0410   KETONESUR 40 (A) 07/31/2010 0410   PROTEINUR >300 (A) 07/31/2010 0410   UROBILINOGEN 4.0 (H) 07/31/2010 0410   NITRITE POSITIVE (A) 07/31/2010 0410   LEUKOCYTESUR LARGE (A) 07/31/2010 0410   Sepsis Labs: Invalid input(s): PROCALCITONIN, [place]  Microbiology:  Recent Results (from the past 240 hour(s))  SARS CORONAVIRUS 2 (TAT 6-24 HRS) Nasopharyngeal Nasopharyngeal Swab     Status: None   Collection Time: 11/26/18  1:37 PM   Specimen: Nasopharyngeal Swab  Result Value Ref Range Status   SARS Coronavirus 2 NEGATIVE NEGATIVE Final    Comment: (NOTE) SARS-CoV-2 target nucleic acids are NOT DETECTED. The SARS-CoV-2 RNA is generally detectable in upper and lower respiratory specimens during the acute phase of infection. Negative results do not preclude SARS-CoV-2 infection, do not rule out co-infections with other pathogens, and should not be used as the sole basis for treatment or other patient management decisions. Negative results must be combined with clinical observations, patient history, and epidemiological information. The expected result is Negative. Fact Sheet for Patients: SugarRoll.be Fact Sheet for Healthcare Providers: https://www.woods-mathews.com/ This test is not yet approved or cleared by the [place] FDA and  has been authorized for detection and/or diagnosis of SARS-CoV-2 by FDA under an Emergency Use Authorization (EUA). This EUA will remain  in effect (meaning this test can be used) for the duration of the COVID-19 declaration under Section 56 4(b)(1) of the Act, 21 U.S.C. section 360bbb-3(b)(1), unless the authorization is terminated or revoked  sooner. Performed at [facility], [place] [address] [place], [place] [address]   MRSA PCR Screening     Status: None   Collection Time: 11/27/18  7:31 AM   Specimen: Nasal Mucosa; Nasopharyngeal  Result Value Ref Range Status   MRSA by PCR NEGATIVE NEGATIVE Final    Comment:        The GeneXpert MRSA Assay (FDA approved for NASAL specimens only), is one component of a comprehensive MRSA colonization surveillance program. It is not intended to diagnose MRSA infection nor to guide or monitor treatment for MRSA infections. Performed at [facility], [place] [address], [place], [place] [address]   Aerobic/Anaerobic Culture (surgical/deep wound)     Status: None (Preliminary result)   Collection Time: 11/28/18 10:40 AM   Specimen: Abscess; Urine  Result Value Ref Range Status   Specimen Description ABSCESS LEFT NEPHROSTOMY PLACEMENT  Final   Special Requests Normal  Final   Gram Stain   Final    ABUNDANT WBC PRESENT,BOTH PMN AND MONONUCLEAR NO ORGANISMS SEEN    Culture   Final    FEW FUSOBACTERIUM NUCLEATUM BETA LACTAMASE NEGATIVE Performed at [facility], [place] [address] [place], [place] [address]    Report Status PENDING  Incomplete    Radiology Studies: Ct Abdomen Pelvis Wo Contrast  Result Date: 12/01/2018 CLINICAL DATA:  53 year old male with history of flank pain. EXAM: CT ABDOMEN AND PELVIS WITHOUT CONTRAST TECHNIQUE: Multidetector CT imaging of the abdomen and pelvis was performed following the standard protocol without IV contrast. COMPARISON:  CT the abdomen and pelvis 11/26/2018. FINDINGS: Lower chest: Areas of mild scarring are noted throughout the lung bases bilaterally. Dilatation of the pulmonic trunk (3.7 cm in diameter), concerning for pulmonary arterial hypertension. Severe calcifications of the aortic valve and mitral annulus. Atherosclerotic calcifications in the left main, left anterior descending, left circumflex and right coronary arteries.  Hepatobiliary: No definite suspicious cystic or solid hepatic lesions are confidently identified on today's noncontrast CT examination. Low-attenuation throughout the hepatic parenchyma, indicative of hepatic steatosis. Unenhanced appearance of the gallbladder is normal. Pancreas: No definite pancreatic mass or peripancreatic fluid collections or inflammatory changes are noted on today's noncontrast CT examination. Spleen: Unremarkable. Adrenals/Urinary Tract: Severe atrophy of the  right kidney. Percutaneous nephrostomy tube in the left kidney with tip reformed in the left renal pelvis. Multiple nonobstructive calculi are noted within the collecting systems of both kidneys measuring up to 1 cm in diameter bilaterally. Low-attenuation lesion in the lower pole of the left kidney measuring 4.2 cm in diameter incompletely characterized on today's non-contrast CT examination, but similar to the prior study and statistically likely to represent a cyst. In the proximal third of the left ureter there is a 4 mm calculus (coronal image 86 of series 6). There is profound thickening of the left ureter with surrounding inflammatory changes. Urinary bladder is completely decompressed. Right lower quadrant urostomy. Bilateral adrenal glands are normal in appearance. Stomach/Bowel: Unenhanced appearance of the stomach is normal. No pathologic dilatation of small bowel or colon. The appendix is not confidently identified and may be surgically absent. Regardless, there are no inflammatory changes noted adjacent to the cecum to suggest the presence of an acute appendicitis at this time. Vascular/Lymphatic: Aortic atherosclerosis. Vascular stent in the left common iliac vein. Right common femoral central venous catheter with tip terminating in the infra hepatic IVC. No lymphadenopathy noted in the abdomen or pelvis. No lymphadenopathy noted in the abdomen or pelvis. Reproductive: Prostate gland and seminal vesicles are unremarkable in  appearance. Other: No significant volume of ascites.  No pneumoperitoneum. Musculoskeletal: There are no aggressive appearing lytic or blastic lesions noted in the visualized portions of the skeleton. IMPRESSION: 1. In addition to multiple nonobstructive calculi in the collecting systems of both kidneys there is a 4 mm calculus in the proximal third of the left ureter. Notably, there is diffuse soft tissue thickening associated with the left ureter with extensive surrounding inflammatory changes. This is presumably infectious/inflammatory in etiology, although infiltrative neoplasm along the course of the ureter is not entirely excluded. 2. Percutaneous left nephrostomy tube appears appropriately positioned. There is no left hydronephrosis. 3. Dilatation of the pulmonic trunk (3.7 cm in diameter), concerning for pulmonary arterial hypertension. 4. Aortic atherosclerosis, in addition to left main and 3 vessel coronary artery disease. Please note that although the presence of coronary artery calcium documents the presence of coronary artery disease, the severity of this disease and any potential stenosis cannot be assessed on this non-gated CT examination. Assessment for potential risk factor modification, dietary therapy or pharmacologic therapy may be warranted, if clinically indicated. 5. Additional incidental findings, as above. Electronically Signed   By: Vinnie Langton M.D.   On: 12/01/2018 16:03     Cheridan Kibler T. Wade  If 7PM-7AM, please contact night-coverage [email or web address] Password [email or web address] 12/02/2018, 10:46 AM

## 2018-12-02 NOTE — Consult Note (Signed)
[facility] Consult: 12:04 PM 12/02/2018  LOS: 6 days    Referring Provider: Dr Cyndia Skeeters.   Primary Care Physician:  [name], MD  [facility] [facility] Gastroenterologist: Althia Forts.    Reason for Consultation:  Left abdominal pain,    HPI: [name] is a 53 y.o. male.  PMH spinal bifida.  Neurogenic bladder, s/p ileal conduit at age 43.  Seizures.  End-stage renal disease.  Dialyzes Monday Wednesday Friday.  Innumerable dialysis vascular access procedures.  Hypothyroidism.  Anemia.  Ileostomy in the 1970s. Narcotic's dependent chronic pain. Previous colonoscopy in [place] 5 years ago but unable to locate any reports and has not been seen at either [facility] or [facility].  Dr. Darnell Level Cho's office also has no records of a colonoscopy.  But the patient recalls having difficulty completing the bowel prep which caused nausea, vomiting and abdominal pain.  Admitted a week ago with urologic issues.  2 weeks of sxs typical for kidney stone of left abdominal pain, radiating to pelvis, anorexia, dry heaves and, finally, fever.   Diagnosed with left ureteral stones and underwent  left ureteral stent placed for stones.  Urology feels nephrostomy tube is in good position, draining urine adequately yet patient continues to complain of pain.  Latest urologist recommended attending to constipation.  Pt has BM's ~ 1 x weekly, uses stool softeners but avoid laxatives due to resulting diarrhea.  Patient says this pain is not consistent with constipation.  He has been getting oral laxatives but has not had a bowel movement but he still is not eating or drinking much. Initial renal stone CT scan of 11/26/2018 made incidental note of circumferential, nonspecific rectal wall thickening.   12/01/2018 CTAP wo contrast port  makes no mention of proctitis.  Mention of no pathology in the stomach, small bowel, colon.  Appendix not confidently identified and may be surgically absent.,  No inflammatory changes in the region of the cecum to suggest acute appendicitis. The urostomy abscess drainage grew fusiform nucleatum.  Dr. Cyndia Skeeters concerned for colonic/rectal neoplasm.      Past Medical History:  Diagnosis Date  . Anemia   . Anxiety   . Constipation   . End stage renal failure on dialysis [facility])    [place]; MWF, on HD since 1997, exhausted upper ext access, and possibly LE access; cath dependent in R groin as of 06/2018  . GERD (gastroesophageal reflux disease)   . Grand mal seizure ([place]) X 4   last 1998 (02/29/2016)  . Heart murmur    no problems with per pt. - nephrologist and Dr. Coletta Memos  . History of blood transfusion 2000s   "I was having blood loss; never found out from where"  . History of kidney stones   . Hypertension    hx of - has not taken bp meds in over 2 years  . Hypothyroidism   . Insomnia   . Migraine    "due to my BP in my late 1990s; none since" (02/29/2016)  . Nonhealing surgical wound    of the  left arteriovenous graft  . Pneumonia    x 2  . Shortness of breath    rarely  . Spina bifida [facility])     Past Surgical History:  Procedure Laterality Date  . AMPUTATION Left 12/11/2017   Procedure: Left Great Toe Amputation;  Surgeon: [name], MD;  Location: [place];  Service: Orthopedics;  Laterality: Left;  . APPENDECTOMY    . ARTERIOVENOUS GRAFT PLACEMENT Left 10/16/2012   left femoral goretex graft         Dr Donnetta Hutching  . AV FISTULA PLACEMENT Left 06/27/2012   Procedure: EXPLORATORY LEFT THY-GRAFT PSEUDO-ANEURYSM;  Surgeon: Conrad Lyon, MD;  Location: [place];  Service: Vascular;  Laterality: Left;  Revision of left Arteriovenus gortex graft in thigh.  . COLONOSCOPY    . I&D EXTREMITY Left 02/29/2016   Procedure: DEBRIDEMENT LEFT THIGH WOUND;  Surgeon: Waynetta Sandy, MD;   Location: [place];  Service: Vascular;  Laterality: Left;  . ILEOSTOMY  1970s  . INCISION AND DRAINAGE Left 10/16/2012   Procedure: INCISION AND Debridement left thigh graft;  Surgeon: Rosetta Posner, MD;  Location: [place];  Service: Vascular;  Laterality: Left;  . INSERTION OF DIALYSIS CATHETER    . INSERTION OF DIALYSIS CATHETER  01/14/2016   Procedure: INSERTION OF DIALYSIS CATHETER;  Surgeon: Waynetta Sandy, MD;  Location: [place];  Service: Vascular;;  . INSERTION OF DIALYSIS CATHETER Right 08/07/2017   Procedure: INSERTION OF DIALYSIS CATHETER;  Surgeon: Waynetta Sandy, MD;  Location: [place];  Service: Vascular;  Laterality: Right;  . INSERTION OF DIALYSIS CATHETER Right 12/21/2017   Procedure: RIGHT FEMORAL DIALYSIS CATHETER EXCHANGE;  Surgeon: Marty Heck, MD;  Location: [place];  Service: Vascular;  Laterality: Right;  . INSERTION OF ILIAC STENT Left 01/14/2016   Procedure: INSERTION OF ILIAC STENT;  Surgeon: Waynetta Sandy, MD;  Location: [place];  Service: Vascular;  Laterality: Left;  . INTRAOPERATIVE ARTERIOGRAM Left 01/14/2016   Procedure: INTRA OPERATIVE ARTERIOGRAM;  Surgeon: Waynetta Sandy, MD;  Location: [place];  Service: Vascular;  Laterality: Left;  . IR NEPHROSTOMY PLACEMENT LEFT  11/28/2018  . LITHOTRIPSY  x 3   "& a laser treatment" (02/29/2016)  . NEPHROSTOMY Bilateral 1968  . PATELLAR TENDON REPAIR Right 1990s   "big incision"  . PERIPHERAL VASCULAR CATHETERIZATION  01/14/2016   Procedure: A/V SHUNTOGRAM;  Surgeon: Waynetta Sandy, MD;  Location: [place];  Service: Vascular;;  . REVISION OF ARTERIOVENOUS GORETEX GRAFT Left 10/16/2012   Procedure: REVISION OF LEFT FEMORAL LOOP ARTERIOVENOUS GORETEX GRAFT;  Surgeon: Rosetta Posner, MD;  Location: [place];  Service: Vascular;  Laterality: Left;  . REVISION OF ARTERIOVENOUS GORETEX GRAFT Left 02/11/2015   Procedure: EXCISION OF SMALL SEGMENT OF EXPOSED LEFT THIGH NON FUNCTIONING   ARTERIOVENOUS GORETEX GRAFT;  Surgeon: Mal Misty, MD;  Location: [place];  Service: Vascular;  Laterality: Left;  . REVISION OF ARTERIOVENOUS GORETEX GRAFT Left 01/14/2016   Procedure: REVISION OF ARTERIOVENOUS GORETEX GRAFT;  Surgeon: Waynetta Sandy, MD;  Location: [place];  Service: Vascular;  Laterality: Left;  . REVISION OF ARTERIOVENOUS GORETEX GRAFT Left 02/29/2016   thigh/pt report  . REVISION OF ARTERIOVENOUS GORETEX GRAFT Left 02/29/2016   Procedure: POSSIBLE REVISION OF LEFT THIGH ARTERIOVENOUS GORETEX GRAFT;  Surgeon: Waynetta Sandy, MD;  Location: [place];  Service: Vascular;  Laterality: Left;  . TEE WITHOUT CARDIOVERSION N/A 12/14/2017   Procedure: TRANSESOPHAGEAL ECHOCARDIOGRAM (TEE);  Surgeon: Acie Fredrickson,  Wonda Cheng, MD;  Location: [place];  Service: Cardiovascular;  Laterality: N/A;  . THROMBECTOMY AND REVISION OF ARTERIOVENTOUS (AV) GORETEX  GRAFT Left 06/12/2018   Procedure: Removal of infected left thigh graft;  Surgeon: Rosetta Posner, MD;  Location: [place];  Service: Vascular;  Laterality: Left;  . THROMBECTOMY W/ EMBOLECTOMY Left 01/14/2016   Procedure: THROMBECTOMY ARTERIOVENOUS GORE-TEX Left thigh GRAFT;  Surgeon: Waynetta Sandy, MD;  Location: [place];  Service: Vascular;  Laterality: Left;  . TONGUE SURGERY  ~ 1990   tongue-tie release   . ULTRASOUND GUIDANCE FOR VASCULAR ACCESS Right 08/07/2017   Procedure: ULTRASOUND GUIDANCE FOR VASCULAR CANNULATION RIGHT FEMORAL VEIN AND LEFT AV FEMORAL GRAFT;  Surgeon: Waynetta Sandy, MD;  Location: [place];  Service: Vascular;  Laterality: Right;  . UPPER EXTREMITY VENOGRAPHY Bilateral 08/09/2017   Procedure: UPPER EXTREMITY VENOGRAPHY;  Surgeon: Serafina Mitchell, MD;  Location: [place];  Service: Cardiovascular;  Laterality: Bilateral;  . VENOGRAM N/A 09/20/2017   Procedure: RIGHT COMMON FEMORAL ARTERY EXPLORATION.  CANNULATION RIGHT COMMON FEMORAL VEIN. VENOGRAM CENTRAL ULTRA SOUND GUIDED RIGHT  FEMORAL VEIN TIMES TWO.;  Surgeon: Waynetta Sandy, MD;  Location: [place];  Service: Vascular;  Laterality: N/A;  . WOUND DEBRIDEMENT Left 02/29/2016   thigh    Prior to Admission medications   Medication Sig Start Date End Date Taking? Authorizing Provider  aspirin 325 MG EC tablet Take 325 mg by mouth daily.   Yes [provider]  calcium acetate (PHOSLO) 667 MG capsule Take 3-4 capsules (2,001-2,668 mg total) by mouth See admin instructions. Take 2708 mg by mouth 3 times daily with meals, take 2001 mg by mouth with snacks Patient taking differently: Take 2,001-2,668 mg by mouth See admin instructions. Take 2,668 mg by mouth three times a day with meals and 2,001 mg with optional snacks 01/14/18  Yes Hennie Duos, MD  clopidogrel (PLAVIX) 75 MG tablet Take 1 tablet (75 mg total) by mouth daily. Patient taking differently: Take 75 mg by mouth at bedtime.  01/14/18  Yes Hennie Duos, MD  famotidine (PEPCID) 40 MG tablet Take 1 tablet (40 mg total) by mouth daily at 2 PM. 01/14/18  Yes Hennie Duos, MD  fluticasone [facility]) 50 MCG/ACT nasal spray Place 2 sprays into both nostrils as needed for allergies or rhinitis (congestion). Patient taking differently: Place 2 sprays into both nostrils as needed for allergies or rhinitis (or congestion).  01/14/18  Yes Hennie Duos, MD  levothyroxine (SYNTHROID, LEVOTHROID) 125 MCG tablet Take 1 tablet (125 mcg total) by mouth daily before breakfast. 01/14/18  Yes Hennie Duos, MD  LORazepam (ATIVAN) 1 MG tablet Take 1 mg by mouth daily.   Yes [provider]  Nutritional Supplements (NEPRO PO) Take 1 Can by mouth 2 (two) times daily.    Yes [provider]  oxyCODONE-acetaminophen (PERCOCET) 10-325 MG tablet Take 1 tablet by mouth every 6 (six) hours as needed for pain. 06/14/18  Yes Dagoberto Ligas, PA-C  promethazine (PHENERGAN) 25 MG tablet Take 1 tablet (25 mg total) by mouth 2 (two) times daily as  needed for nausea or vomiting. 01/14/18  Yes Hennie Duos, MD  sodium polystyrene (KAYEXALATE) powder Take 15 g by mouth See admin instructions. Take 15 g (mixed in water) by mouth on Saturdays and Sundays 01/14/18  Yes Hennie Duos, MD  traZODone (DESYREL) 100 MG tablet Take 1 tablet (100 mg total) by mouth at bedtime.  01/14/18  Yes Hennie Duos, MD  zolpidem (AMBIEN) 10 MG tablet Take 1 tablet (10 mg total) by mouth at bedtime. 01/14/18  Yes Hennie Duos, MD  aspirin EC 81 MG tablet Take 1 tablet (81 mg total) by mouth daily. 12/01/18 03/01/19  Mercy Riding, MD  cephALEXin (KEFLEX) 500 MG capsule Take 1 capsule (500 mg total) by mouth 2 (two) times daily for 10 doses. Take after dialysis on dialysis days. 12/01/18 12/06/18  Mercy Riding, MD  methocarbamol (ROBAXIN) 500 MG tablet Take 1 tablet (500 mg total) by mouth every 6 (six) hours as needed for muscle spasms. Patient not taking: Reported on 11/26/2018 01/14/18   Hennie Duos, MD  midodrine (PROAMATINE) 5 MG tablet Take 1 tablet (5 mg total) by mouth 3 (three) times daily with meals. Patient not taking: Reported on 11/26/2018 01/14/18   Hennie Duos, MD  Polyethylene Glycol 3350 POWD Take 17 g by mouth daily as needed. [place] 1 CAPFUL (17GM) IN 4-8OZ. OF WATER AND DRINK BY MOUTH DAILY AS NEEDED FOR MILD CONSTIPATION Patient not taking: Reported on 11/26/2018 01/14/18   Hennie Duos, MD  sodium zirconium cyclosilicate (LOKELMA) 10 g PACK packet Take 10 g by mouth daily. TAKE 10GM PACK ON OFF DIALYSIS DAYS ON TUESDAY ON THURSDAY ON SUNDAY Patient not taking: Reported on 11/26/2018 01/14/18   Hennie Duos, MD    Scheduled Meds: . Chlorhexidine Gluconate Cloth  6 each Topical Daily  . famotidine  20 mg Oral Q1400  . feeding supplement (NEPRO CARB STEADY)  237 mL Oral BID BM  . levothyroxine  125 mcg Oral QAC breakfast  . LORazepam  1 mg Oral Daily  . polyethylene glycol  17 g Oral BID  . sodium chloride flush  5  mL Intracatheter Q8H  . sodium polystyrene  15 g Oral Once per day on Sun Sat  . traZODone  100 mg Oral QHS   Infusions: . cefTRIAXone (ROCEPHIN)  IV 1 g (12/01/18 1730)   PRN Meds: acetaminophen **OR** acetaminophen, fluticasone, heparin, HYDROmorphone (DILAUDID) injection, ondansetron (ZOFRAN) IV, [DISCONTINUED] oxyCODONE-acetaminophen **AND** oxyCODONE, oxyCODONE-acetaminophen, zolpidem   Allergies as of 11/25/2018 - Review Complete 11/25/2018  Allergen Reaction Noted  . Furadantin [nitrofurantoin] Other (See Comments) 06/27/2012  . Mandelamine [methenamine] Other (See Comments) 06/27/2012  . Noroxin [norfloxacin] Other (See Comments) 06/27/2012  . [place] Nausea Only 02/08/2015  . Contrast media [iodinated diagnostic agents] Nausea And Vomiting 06/27/2012  . Hydrocodone Other (See Comments) 11/05/2012  . Metrizamide Nausea And Vomiting 02/08/2015  . Sulfa antibiotics Cough 06/24/2012  . Sulfur Cough 02/08/2015    Family History  Problem Relation Age of Onset  . Diabetes Mother   . Hypertension Mother   . Heart disease Mother        before age 84  . Diabetes Father   . Heart attack Father        X's 3  . Diabetes Sister   . Bipolar disorder Sister     Social History   Socioeconomic History  . Marital status: Single    Spouse name: Not on file  . Number of children: 0  . Years of education: Not on file  . Highest education level: Not on file  Occupational History  . Occupation: Disabled  Social Needs  . Financial resource strain: Not on file  . Food insecurity    Worry: Not on file    Inability: Not on file  . Transportation needs  Medical: Not on file    Non-medical: Not on file  Tobacco Use  . Smoking status: Never Smoker  . Smokeless tobacco: Never Used  Substance and Sexual Activity  . Alcohol use: Never    Alcohol/week: 0.0 standard drinks    Frequency: Never    Comment: 02/29/2016 "nothing since  the mid 1990s  . Drug use: Yes    Types:  Marijuana    Comment: 02/29/2016 "nothing since ~ 1995"  . Sexual activity: Yes  Lifestyle  . Physical activity    Days per week: Not on file    Minutes per session: Not on file  . Stress: Not on file  Relationships  . Social Herbalist on phone: Not on file    Gets together: Not on file    Attends religious service: Not on file    Active member of club or organization: Not on file    Attends meetings of clubs or organizations: Not on file    Relationship status: Not on file  . Intimate partner violence    Fear of current or ex partner: Not on file    Emotionally abused: Not on file    Physically abused: Not on file    Forced sexual activity: Not on file  Other Topics Concern  . Not on file  Social History Narrative  . Not on file    REVIEW OF SYSTEMS: Constitutional: Feels tired, exhausted. ENT:  No nose bleeds Pulm: No trouble breathing.  No cough. CV:  No palpitations, no LE edema.  No angina GU:  No hematuria, no frequency GI: See HPI.  Generally does not have much trouble with heartburn or indigestion.  Stable weekly bowel movements.  No blood in his stools. Heme: Denies unusual bleeding or bruising. Transfusions: None in epic records. Neuro: Chronic pain managed with chronic narcotics.  He has pain from previous shoulder fractures, from kidney stones, from left ankle injury.  No headaches, no peripheral tingling or numbness Derm:  No itching, no rash or sores.  Endocrine:  No sweats or chills.  No polyuria or dysuria Immunization: Reviewed.  I do not see records of a current flu shot. Travel:  None beyond local counties in last few months.    PHYSICAL EXAM: Vital signs in last 24 hours: Vitals:   12/02/18 0551 12/02/18 0936  BP: (!) 113/54 124/74  Pulse: 70 75  Resp: 17 18  Temp: 98.6 F (37 C) 98.8 F (37.1 C)  SpO2: 98% 97%   Wt Readings from Last 3 Encounters:  12/02/18 120.5 kg  07/01/18 122.9 kg  06/15/18 124.2 kg    General: Somewhat  peeved obese, generally unwell appearing but not acutely ill-appearing WM. Head: No facial asymmetry or swelling.  No signs of head trauma. Eyes: No scleral icterus.  No conjunctival pallor.  EOMI. Ears: No hearing loss Nose: No congestion or discharge Mouth: Oral mucosa pink, moist, clear.  Tongue midline.  Fair dentition though there is a lot of dental plaque.  Some missing teeth. Neck: No JVD, no masses, no thyromegaly.  Well-healed scar at the base of his neck related to previous parathyroidectomy. Lungs: Clear bilaterally.  No labored breathing or cough Heart: RRR.  No MRG.  S1, S2 present Abdomen: Obese.  Soft.  Tender without guarding or rebound on the left abdomen.  No masses, no HSM, no bruits.  Ileostomy/ileal conduit in position on right mid/lower abdomen.  Nephrostomy tube in position on left rear flank and draining  opaque bloody, somewhat purulent looking liquid.  Caveat is that the patient was unable to lie down for the exam and was examined while leaning on his left elbow from a sitting position..   Rectal: Deferred Musc/Skeltl: No joint redness, swelling or gross deformity.  Normal difficulty in raising himself up from the lounge chair, standing and swiveling onto the bed. Extremities: No CCE. Neurologic: Oriented x3.  Fully alert.  Appropriate.  Moves all 4 limbs, strength not tested. Skin: No rashes, no sores, no telangiectasia Nodes: No cervical adenopathy Psych: Cooperative.  Intake/Output from previous day: 11/29 0701 - 11/30 0700 In: 1185 [P.O.:1080; IV Piggyback:100] Out: 100 [Urine:100] Intake/Output this shift: Total I/O In: 300 [P.O.:300] Out: -   LAB RESULTS: Recent Labs    11/30/18 0453 12/01/18 0501 12/02/18 0639  WBC 9.5 13.3* 10.4  HGB 8.7* 9.6* 9.0*  HCT 27.6* 29.7* 27.7*  PLT 147* 184 181   BMET Lab Results  Component Value Date   NA 134 (L) 12/02/2018   NA 133 (L) 12/01/2018   NA 135 11/30/2018   K 5.3 (H) 12/02/2018   K 4.7 12/01/2018    K 4.6 11/30/2018   CL 94 (L) 12/02/2018   CL 95 (L) 12/01/2018   CL 95 (L) 11/30/2018   CO2 23 12/02/2018   CO2 21 (L) 12/01/2018   CO2 25 11/30/2018   GLUCOSE 87 12/02/2018   GLUCOSE 107 (H) 12/01/2018   GLUCOSE 108 (H) 11/30/2018   BUN 103 (H) 12/02/2018   BUN 86 (H) 12/01/2018   BUN 65 (H) 11/30/2018   CREATININE 12.93 (H) 12/02/2018   CREATININE 10.73 (H) 12/01/2018   CREATININE 9.39 (H) 11/30/2018   CALCIUM 9.4 12/02/2018   CALCIUM 9.2 12/01/2018   CALCIUM 8.4 (L) 11/30/2018   LFT Recent Labs    11/30/18 0453 12/01/18 0501 12/02/18 0639  ALBUMIN 2.4* 2.6* 2.6*   PT/INR Lab Results  Component Value Date   INR 1.0 12/01/2018   INR 1.2 11/26/2018   INR 1.14 12/13/2017   Hepatitis Panel No results for input(s): HEPBSAG, HCVAB, HEPAIGM, HEPBIGM in the last 72 hours. C-Diff No components found for: CDIFF Lipase     Component Value Date/Time   LIPASE 23 11/26/2018 0138    Drugs of Abuse  No results found for: LABOPIA, COCAINSCRNUR, LABBENZ, AMPHETMU, THCU, LABBARB   RADIOLOGY STUDIES: Ct Abdomen Pelvis Wo Contrast  Result Date: 12/01/2018 CLINICAL DATA:  53 year old male with history of flank pain. EXAM: CT ABDOMEN AND PELVIS WITHOUT CONTRAST TECHNIQUE: Multidetector CT imaging of the abdomen and pelvis was performed following the standard protocol without IV contrast. COMPARISON:  CT the abdomen and pelvis 11/26/2018. FINDINGS: Lower chest: Areas of mild scarring are noted throughout the lung bases bilaterally. Dilatation of the pulmonic trunk (3.7 cm in diameter), concerning for pulmonary arterial hypertension. Severe calcifications of the aortic valve and mitral annulus. Atherosclerotic calcifications in the left main, left anterior descending, left circumflex and right coronary arteries. Hepatobiliary: No definite suspicious cystic or solid hepatic lesions are confidently identified on today's noncontrast CT examination. Low-attenuation throughout the hepatic  parenchyma, indicative of hepatic steatosis. Unenhanced appearance of the gallbladder is normal. Pancreas: No definite pancreatic mass or peripancreatic fluid collections or inflammatory changes are noted on today's noncontrast CT examination. Spleen: Unremarkable. Adrenals/Urinary Tract: Severe atrophy of the right kidney. Percutaneous nephrostomy tube in the left kidney with tip reformed in the left renal pelvis. Multiple nonobstructive calculi are noted within the collecting systems of both kidneys measuring up  to 1 cm in diameter bilaterally. Low-attenuation lesion in the lower pole of the left kidney measuring 4.2 cm in diameter incompletely characterized on today's non-contrast CT examination, but similar to the prior study and statistically likely to represent a cyst. In the proximal third of the left ureter there is a 4 mm calculus (coronal image 86 of series 6). There is profound thickening of the left ureter with surrounding inflammatory changes. Urinary bladder is completely decompressed. Right lower quadrant urostomy. Bilateral adrenal glands are normal in appearance. Stomach/Bowel: Unenhanced appearance of the stomach is normal. No pathologic dilatation of small bowel or colon. The appendix is not confidently identified and may be surgically absent. Regardless, there are no inflammatory changes noted adjacent to the cecum to suggest the presence of an acute appendicitis at this time. Vascular/Lymphatic: Aortic atherosclerosis. Vascular stent in the left common iliac vein. Right common femoral central venous catheter with tip terminating in the infra hepatic IVC. No lymphadenopathy noted in the abdomen or pelvis. No lymphadenopathy noted in the abdomen or pelvis. Reproductive: Prostate gland and seminal vesicles are unremarkable in appearance. Other: No significant volume of ascites.  No pneumoperitoneum. Musculoskeletal: There are no aggressive appearing lytic or blastic lesions noted in the visualized  portions of the skeleton. IMPRESSION: 1. In addition to multiple nonobstructive calculi in the collecting systems of both kidneys there is a 4 mm calculus in the proximal third of the left ureter. Notably, there is diffuse soft tissue thickening associated with the left ureter with extensive surrounding inflammatory changes. This is presumably infectious/inflammatory in etiology, although infiltrative neoplasm along the course of the ureter is not entirely excluded. 2. Percutaneous left nephrostomy tube appears appropriately positioned. There is no left hydronephrosis. 3. Dilatation of the pulmonic trunk (3.7 cm in diameter), concerning for pulmonary arterial hypertension. 4. Aortic atherosclerosis, in addition to left main and 3 vessel coronary artery disease. Please note that although the presence of coronary artery calcium documents the presence of coronary artery disease, the severity of this disease and any potential stenosis cannot be assessed on this non-gated CT examination. Assessment for potential risk factor modification, dietary therapy or pharmacologic therapy may be warranted, if clinically indicated. 5. Additional incidental findings, as above. Electronically Signed   By: Vinnie Langton M.D.   On: 12/01/2018 16:03     IMPRESSION:   *   Proctitis visible on renal stone CT.  This along with growth of fusiformbacterium nucleatum from the left nephrostomy tube raises question of colonic neoplasia At baseline patient moves his bowels once a week and this is been his habit for many years. Unable to locate records, confirmed colonoscopy patient says was performed 5 years ago.  *    Ureteral stone, status post left nephrostomy tube.  Continues with left abdominal pain which is his biggest complaint at this point  *    Ileal conduit placed 1968.  *    Chronic pain, narcotics requiring.    *   ESRD.  *    Hyperkalemia, hyponatremia.  *    Normocytic anemia.  Acute on chronic.  Baseline Hgb  9 to 10.5 10 months ago. Currently has dropped from 10.6 to 9.  Suspect blood loss from the nephrostomy tube, ureteral stone is a source of blood loss in addition to anemia from chronic kidney disease.    PLAN:     *   Per Dr Havery Moros.  Of note, patient vividly recalls not tolerating colonoscopy bowel prep and considers bowel prep and "  allergy".  So I am not sure were going to convince him to undergo colonoscopy but patient may be amenable to flexible sigmoidoscopy.   Azucena Freed  12/02/2018, 12:04 PM Phone [phone]

## 2018-12-02 NOTE — Consult Note (Signed)
[facility] for Infectious Disease  Total days of antibiotics 7 ceftriaxone               Reason for Consult:renal abscess   Referring Physician: gonfa  Principal Problem:   Flank pain Active Problems:   ESRD (end stage renal disease) on dialysis ([name])   Nephrolithiasis   Hydronephrosis with renal and ureteral calculus obstruction   Left flank pain    HPI: [name] is a 53 y.o. male with hx of neurogenic bladder 2/2 spinal bifida s/p ileal conduit as infant, ESRD on HD, and also ileostomy, hx of recurrent kidney stones  S/p urostomy/and ureteral stents for which has caused acute on chronic flank/back pain. He reports in the last 2 weeks worsening left sided flank pain radiating to groin with associated, fever, chills, nausea from pain. He was found to have left sided renal abscess s/p left urostomy placed. CT suggests also having prostitis. He reports having significant pain unable to sleep, tearful. He states that the dose of dilaudid appears to help somewhat. 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 He has been on ceftriaxone. ID consultation asked to weigh in on treatment.   Ros: also suffers from constipation. Passing gas appears to improve slight discomfort, but overall his pain is localized to left flank   Dr. Cyndia Skeeters concerned for colonic/rectal neoplasm.   Past Medical History:  Diagnosis Date   Anemia    Anxiety    Constipation    End stage renal failure on dialysis [facility])    [place]; MWF, on HD since 1997, exhausted upper ext access, and possibly LE access; cath dependent in R groin as of 06/2018   GERD (gastroesophageal reflux disease)    Grand mal seizure ([place]) X 4   last 1998 (02/29/2016)   Heart murmur    no problems with per pt. - nephrologist and Dr. Coletta Memos   History of blood transfusion 2000s   "I was having blood loss; never found out from where"   History of kidney stones    Hypertension    hx of - has not taken  bp meds in over 2 years   Hypothyroidism    Insomnia    Migraine    "due to my BP in my late 1990s; none since" (02/29/2016)   Nonhealing surgical wound    of the left arteriovenous graft   Pneumonia    x 2   Shortness of breath    rarely   Spina bifida ([place])     Allergies:  Allergies  Allergen Reactions   Hydrocodone Other (See Comments)    Caused involuntary movement and twitching. CANNOT TAKE DUE TO MUSCLE SPASMS AND MUSCLE TREMORS   Furadantin [Nitrofurantoin] Other (See Comments)    UNSPECIFIED REACTION    Mandelamine [Methenamine] Other (See Comments)    UNSPECIFIED REACTION    Noroxin [Norfloxacin] Other (See Comments)    UNSPECIFIED REACTION    Carmine Nausea Only   Contrast Media [Iodinated Diagnostic Agents] Nausea And Vomiting    Oral dye causes vomiting, IV dye is okay   Metrizamide Nausea And Vomiting    Oral dye causes vomiting, IV dye is okay   Sulfa Antibiotics Other (See Comments) and Cough    Childhood reaction - pt could not confirm that it was a cough   Sulfur Cough    Childhood reaction - pt could not confirm that it was a cough    Current antibiotics:   MEDICATIONS:  ALPRAZolam  0.5 mg Oral Once   Chlorhexidine Gluconate Cloth  6 each Topical Daily   famotidine  20 mg Oral Q1400   feeding supplement (NEPRO CARB STEADY)  237 mL Oral BID BM   levothyroxine  125 mcg Oral QAC breakfast   LORazepam  1 mg Oral Daily   polyethylene glycol  17 g Oral BID   sodium chloride flush  5 mL Intracatheter Q8H   sodium polystyrene  15 g Oral Once per day on Sun Sat   traZODone  100 mg Oral QHS    Social History   Tobacco Use   Smoking status: Never Smoker   Smokeless tobacco: Never Used  Substance Use Topics   Alcohol use: Never    Alcohol/week: 0.0 standard drinks    Frequency: Never    Comment: 02/29/2016 "nothing since  the mid 1990s   Drug use: Yes    Types: Marijuana    Comment: 02/29/2016 "nothing since ~ 1995"     Family History  Problem Relation Age of Onset   Diabetes Mother    Hypertension Mother    Heart disease Mother        before age 14   Diabetes Father    Heart attack Father        X's 3   Diabetes Sister    Bipolar disorder Sister     Review of Systems - 12 point ros is negative except what is mentioned above   OBJECTIVE: Temp:  [98.6 F (37 C)-99 F (37.2 C)] 98.6 F (37 C) (11/30 1700) Pulse Rate:  [67-80] 78 (11/30 1700) Resp:  [16-20] 20 (11/30 1700) BP: (94-142)/(48-82) 138/66 (11/30 1700) SpO2:  [97 %-100 %] 100 % (11/30 1700) Weight:  [120.5 kg-123.2 kg] 123.2 kg (11/30 1400) Physical Exam  Constitutional: He is oriented to person, place, and time. He appears well-developed and well-nourished. No distress.  HENT:  Mouth/Throat: Oropharynx is clear and moist. No oropharyngeal exudate.  Cardiovascular: Normal rate, regular rhythm and normal heart sounds. Exam reveals no gallop and no friction rub.  No murmur heard.  Pulmonary/Chest: Effort normal and breath sounds normal. No respiratory distress. He has no wheezes.  Abdominal: Soft. Bowel sounds are normal. He exhibits no distension. There is no tenderness.  Back:left nephrostomy tube with reddish fluid in collection system. Right flank scarring from prior urostomy Neurological: He is alert and oriented to person, place, and time.  Skin: Skin is warm and dry. No rash noted. No erythema.  Psychiatric: He has a normal mood and affect. His behavior is normal.    LABS: Results for orders placed or performed during the hospital encounter of 11/25/18 (from the past 48 hour(s))  Renal daily     Status: Abnormal   Collection Time: 12/01/18  5:01 AM  Result Value Ref Range   Sodium 133 (L) 135 - 145 mmol/L   Potassium 4.7 3.5 - 5.1 mmol/L   Chloride 95 (L) 98 - 111 mmol/L   CO2 21 (L) 22 - 32 mmol/L   Glucose, Bld 107 (H) 70 - 99 mg/dL   BUN 86 (H) 6 - 20 mg/dL   Creatinine, Ser 10.73 (H) 0.61 - 1.24 mg/dL    Calcium 9.2 8.9 - 10.3 mg/dL   Phosphorus 2.9 2.5 - 4.6 mg/dL   Albumin 2.6 (L) 3.5 - 5.0 g/dL   GFR calc non Af Amer 5 (L) >60 mL/min   GFR calc Af Amer 6 (L) >60 mL/min   Anion gap 17 (H) 5 -  15    Comment: Performed at [facility], [place] [address] [place], [place] [address]  Magnesium-daily     Status: None   Collection Time: 12/01/18  5:01 AM  Result Value Ref Range   Magnesium 2.0 1.7 - 2.4 mg/dL    Comment: Performed at [facility] [address] [place], [place] [address]  CBC-5am     Status: Abnormal   Collection Time: 12/01/18  5:01 AM  Result Value Ref Range   WBC 13.3 (H) 4.0 - 10.5 K/uL   RBC 3.15 (L) 4.22 - 5.81 MIL/uL   Hemoglobin 9.6 (L) 13.0 - 17.0 g/dL   HCT 29.7 (L) 39.0 - 52.0 %   MCV 94.3 80.0 - 100.0 fL   MCH 30.5 26.0 - 34.0 pg   MCHC 32.3 30.0 - 36.0 g/dL   RDW 15.0 11.5 - 15.5 %   Platelets 184 150 - 400 K/uL   nRBC 0.0 0.0 - 0.2 %    Comment: Performed at [facility], [place] [address], [place], [place] [address]  PT-INR 5am     Status: None   Collection Time: 12/01/18  5:01 AM  Result Value Ref Range   Prothrombin Time 13.3 11.4 - 15.2 seconds   INR 1.0 0.8 - 1.2    Comment: (NOTE) INR goal varies based on device and disease states. Performed at [facility], [place] [address], [place], [place] [address]   APTT-5am     Status: None   Collection Time: 12/01/18  5:01 AM  Result Value Ref Range   aPTT 35 24 - 36 seconds    Comment: Performed at [place] [address] [place], [place] [address]  Magnesium-daily     Status: None   Collection Time: 12/02/18  6:39 AM  Result Value Ref Range   Magnesium 2.1 1.7 - 2.4 mg/dL    Comment: Performed at [facility] [address] [place], [place] [address]  CBC-5am     Status: Abnormal   Collection Time: 12/02/18  6:39 AM  Result Value Ref Range   WBC 10.4 4.0 - 10.5 K/uL   RBC 2.91 (L) 4.22 - 5.81 MIL/uL   Hemoglobin 9.0 (L) 13.0 - 17.0 g/dL   HCT 27.7  (L) 39.0 - 52.0 %   MCV 95.2 80.0 - 100.0 fL   MCH 30.9 26.0 - 34.0 pg   MCHC 32.5 30.0 - 36.0 g/dL   RDW 15.0 11.5 - 15.5 %   Platelets 181 150 - 400 K/uL   nRBC 0.0 0.0 - 0.2 %    Comment: Performed at [facility], [place] [address], [place], [place] [address]  Renal function panel     Status: Abnormal   Collection Time: 12/02/18  6:39 AM  Result Value Ref Range   Sodium 134 (L) 135 - 145 mmol/L   Potassium 5.3 (H) 3.5 - 5.1 mmol/L   Chloride 94 (L) 98 - 111 mmol/L   CO2 23 22 - 32 mmol/L   Glucose, Bld 87 70 - 99 mg/dL   BUN 103 (H) 6 - 20 mg/dL   Creatinine, Ser 12.93 (H) 0.61 - 1.24 mg/dL   Calcium 9.4 8.9 - 10.3 mg/dL   Phosphorus 4.0 2.5 - 4.6 mg/dL   Albumin 2.6 (L) 3.5 - 5.0 g/dL   GFR calc non Af Amer 4 (L) >60 mL/min   GFR calc Af Amer 4 (L) >60 mL/min  Anion gap 17 (H) 5 - 15    Comment: Performed at [facility], [place] [address], [place], [place] [address]    MICRO: FEW FUSOBACTERIUM NUCLEATUM  BETA LACTAMASE NEGATIVE  IMAGING: Ct Abdomen Pelvis Wo Contrast  Result Date: 12/01/2018 CLINICAL DATA:  53 year old male with history of flank pain. EXAM: CT ABDOMEN AND PELVIS WITHOUT CONTRAST TECHNIQUE: Multidetector CT imaging of the abdomen and pelvis was performed following the standard protocol without IV contrast. COMPARISON:  CT the abdomen and pelvis 11/26/2018. FINDINGS: Lower chest: Areas of mild scarring are noted throughout the lung bases bilaterally. Dilatation of the pulmonic trunk (3.7 cm in diameter), concerning for pulmonary arterial hypertension. Severe calcifications of the aortic valve and mitral annulus. Atherosclerotic calcifications in the left main, left anterior descending, left circumflex and right coronary arteries. Hepatobiliary: No definite suspicious cystic or solid hepatic lesions are confidently identified on today's noncontrast CT examination. Low-attenuation throughout the hepatic parenchyma, indicative of hepatic steatosis.  Unenhanced appearance of the gallbladder is normal. Pancreas: No definite pancreatic mass or peripancreatic fluid collections or inflammatory changes are noted on today's noncontrast CT examination. Spleen: Unremarkable. Adrenals/Urinary Tract: Severe atrophy of the right kidney. Percutaneous nephrostomy tube in the left kidney with tip reformed in the left renal pelvis. Multiple nonobstructive calculi are noted within the collecting systems of both kidneys measuring up to 1 cm in diameter bilaterally. Low-attenuation lesion in the lower pole of the left kidney measuring 4.2 cm in diameter incompletely characterized on today's non-contrast CT examination, but similar to the prior study and statistically likely to represent a cyst. In the proximal third of the left ureter there is a 4 mm calculus (coronal image 86 of series 6). There is profound thickening of the left ureter with surrounding inflammatory changes. Urinary bladder is completely decompressed. Right lower quadrant urostomy. Bilateral adrenal glands are normal in appearance. Stomach/Bowel: Unenhanced appearance of the stomach is normal. No pathologic dilatation of small bowel or colon. The appendix is not confidently identified and may be surgically absent. Regardless, there are no inflammatory changes noted adjacent to the cecum to suggest the presence of an acute appendicitis at this time. Vascular/Lymphatic: Aortic atherosclerosis. Vascular stent in the left common iliac vein. Right common femoral central venous catheter with tip terminating in the infra hepatic IVC. No lymphadenopathy noted in the abdomen or pelvis. No lymphadenopathy noted in the abdomen or pelvis. Reproductive: Prostate gland and seminal vesicles are unremarkable in appearance. Other: No significant volume of ascites.  No pneumoperitoneum. Musculoskeletal: There are no aggressive appearing lytic or blastic lesions noted in the visualized portions of the skeleton. IMPRESSION: 1. In  addition to multiple nonobstructive calculi in the collecting systems of both kidneys there is a 4 mm calculus in the proximal third of the left ureter. Notably, there is diffuse soft tissue thickening associated with the left ureter with extensive surrounding inflammatory changes. This is presumably infectious/inflammatory in etiology, although infiltrative neoplasm along the course of the ureter is not entirely excluded. 2. Percutaneous left nephrostomy tube appears appropriately positioned. There is no left hydronephrosis. 3. Dilatation of the pulmonic trunk (3.7 cm in diameter), concerning for pulmonary arterial hypertension. 4. Aortic atherosclerosis, in addition to left main and 3 vessel coronary artery disease. Please note that although the presence of coronary artery calcium documents the presence of coronary artery disease, the severity of this disease and any potential stenosis cannot be assessed on this non-gated CT examination. Assessment for potential risk factor modification, dietary therapy or  pharmacologic therapy may be warranted, if clinically indicated. 5. Additional incidental findings, as above. Electronically Signed   By: Vinnie Langton M.D.   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

## 2018-12-02 NOTE — Progress Notes (Addendum)
Assessment:   53 yo man with hx of spina bifida and neurogenic bladder managed with ileal conduit and renal failure on HD with left ureteral calculi, mild hydro with inflammatory changes to distal ureter, and pain s/p L PCN tube.  Patient has had intractable pain since admission.    Plan:  The patient asked me to leave during the encounter and said that he will find his own urologist.  I vocalized that my colleagues and I are all in agreement with planning for a nephrectomy but wanted to also discuss the right kidney and ileal conduit.  Patient said he was not prepared to discuss anything further.  I recommend referral to tertiary/academic medical center for further care.  The patient stated he wants nothing to do with any of the Urologists in [place].     Subjective: Patient is in HD.  He states he has not eaten or had a bm since admission.  He reported his pain was improving a little with more pain control today.  He then cut the encounter short - see above.  Objective: Vital signs in last 24 hours: Temp:  [98.6 F (37 C)-99 F (37.2 C)] 98.6 F (37 C) (11/30 1700) Pulse Rate:  [67-80] 78 (11/30 1700) Resp:  [16-20] 20 (11/30 1700) BP: (94-142)/(48-82) 138/66 (11/30 1700) SpO2:  [97 %-100 %] 100 % (11/30 1700) Weight:  [120.5 kg-123.2 kg] 123.2 kg (11/30 1400)  Intake/Output from previous day: 11/29 0701 - 11/30 0700 In: 1185 [P.O.:1080; IV Piggyback:100] Out: 100 [Urine:100] Intake/Output this shift: No intake/output data recorded.  Past Medical History:  Diagnosis Date  . Anemia   . Anxiety   . Constipation   . End stage renal failure on dialysis [facility])    [place]; MWF, on HD since 1997, exhausted upper ext access, and possibly LE access; cath dependent in R groin as of 06/2018  . GERD (gastroesophageal reflux disease)   . Grand mal seizure ([place]) X 4   last 1998 (02/29/2016)  . Heart murmur    no problems with per pt. - nephrologist and Dr. Coletta Memos  . History of  blood transfusion 2000s   "I was having blood loss; never found out from where"  . History of kidney stones   . Hypertension    hx of - has not taken bp meds in over 2 years  . Hypothyroidism   . Insomnia   . Migraine    "due to my BP in my late 1990s; none since" (02/29/2016)  . Nonhealing surgical wound    of the left arteriovenous graft  . Pneumonia    x 2  . Shortness of breath    rarely  . Spina bifida ([place])    Current Facility-Administered Medications  Medication Dose Route Frequency Provider Last Rate Last Dose  . acetaminophen (TYLENOL) tablet 650 mg  650 mg Oral Q6H PRN Jani Gravel, MD   650 mg at 11/26/18 0920   Or  . acetaminophen (TYLENOL) suppository 650 mg  650 mg Rectal Q6H PRN Jani Gravel, MD      . ALPRAZolam Duanne Moron) tablet 0.5 mg  0.5 mg Oral Once Pearson Grippe B, MD      . cefTRIAXone (ROCEPHIN) 1 g in sodium chloride 0.9 % 100 mL IVPB  1 g Intravenous Q24H Roney Jaffe, MD 200 mL/hr at 12/02/18 1740 1 g at 12/02/18 1740  . Chlorhexidine Gluconate Cloth 2 % PADS 6 each  6 each Topical Daily Barton Dubois, MD   6  each at 12/02/18 0804  . famotidine (PEPCID) tablet 20 mg  20 mg Oral Q1400 Barton Dubois, MD   20 mg at 12/02/18 1333  . feeding supplement (NEPRO CARB STEADY) liquid 237 mL  237 mL Oral BID BM Gonfa, Taye T, MD 0 mL/hr at 11/28/18 1537 237 mL at 12/02/18 0804  . fluticasone (FLONASE) 50 MCG/ACT nasal spray 2 spray  2 spray Each Nare PRN Jani Gravel, MD      . heparin injection 5,800 Units  5,800 Units Intravenous Q dialysis Elmarie Shiley, MD   5,800 Units at 12/02/18 1724  . HYDROmorphone (DILAUDID) injection 1-2 mg  1-2 mg Intravenous Q4H PRN Roney Jaffe, MD   2 mg at 12/02/18 1842  . levothyroxine (SYNTHROID) tablet 125 mcg  125 mcg Oral QAC breakfast Jani Gravel, MD   125 mcg at 12/02/18 0804  . LORazepam (ATIVAN) tablet 1 mg  1 mg Oral Daily Jani Gravel, MD   1 mg at 12/02/18 0804  . ondansetron (ZOFRAN) injection 4 mg  4 mg Intravenous Q6H PRN Barton Dubois, MD   4 mg at 12/02/18 1418  . oxyCODONE (Oxy IR/ROXICODONE) immediate release tablet 5 mg  5 mg Oral Q6H PRN Barton Dubois, MD   5 mg at 12/02/18 1740  . oxyCODONE-acetaminophen (PERCOCET) 7.5-325 MG per tablet 1 tablet  1 tablet Oral Q6H PRN Mercy Riding, MD   1 tablet at 12/02/18 1740  . polyethylene glycol (MIRALAX / GLYCOLAX) packet 17 g  17 g Oral BID Wendee Beavers T, MD   17 g at 12/01/18 1256  . sodium chloride flush (NS) 0.9 % injection 5 mL  5 mL Intracatheter Q8H Arne Cleveland, MD   5 mL at 12/02/18 1336  . sodium polystyrene (KAYEXALATE) powder 15 g  15 g Oral Once per day on Sun Sat Jani Gravel, MD      . traZODone (DESYREL) tablet 100 mg  100 mg Oral Loma Sousa, MD   100 mg at 12/01/18 2119  . zolpidem (AMBIEN) tablet 10 mg  10 mg Oral QHS PRN Mercy Riding, MD   10 mg at 12/01/18 2119    Physical Exam:  General: Patient is in no apparent distress Lungs: Normal respiratory effort, chest expands symmetrically.     Lab Results: Recent Labs    11/30/18 0453 12/01/18 0501 12/02/18 0639  WBC 9.5 13.3* 10.4  HGB 8.7* 9.6* 9.0*  HCT 27.6* 29.7* 27.7*   BMET Recent Labs    12/01/18 0501 12/02/18 0639  NA 133* 134*  K 4.7 5.3*  CL 95* 94*  CO2 21* 23  GLUCOSE 107* 87  BUN 86* 103*  CREATININE 10.73* 12.93*  CALCIUM 9.2 9.4   Recent Labs    12/01/18 0501  INR 1.0   No results for input(s): LABURIN in the last 72 hours. Results for orders placed or performed during the hospital encounter of 11/25/18  SARS CORONAVIRUS 2 (TAT 6-24 HRS) Nasopharyngeal Nasopharyngeal Swab     Status: None   Collection Time: 11/26/18  1:37 PM   Specimen: Nasopharyngeal Swab  Result Value Ref Range Status   SARS Coronavirus 2 NEGATIVE NEGATIVE Final    Comment: (NOTE) SARS-CoV-2 target nucleic acids are NOT DETECTED. The SARS-CoV-2 RNA is generally detectable in upper and lower respiratory specimens during the acute phase of infection. Negative results do not preclude  SARS-CoV-2 infection, do not rule out co-infections with other pathogens, and should not be used as the sole basis for  treatment or other patient management decisions. Negative results must be combined with clinical observations, patient history, and epidemiological information. The expected result is Negative. Fact Sheet for Patients: SugarRoll.be Fact Sheet for Healthcare Providers: https://www.woods-mathews.com/ This test is not yet approved or cleared by the [place] FDA and  has been authorized for detection and/or diagnosis of SARS-CoV-2 by FDA under an Emergency Use Authorization (EUA). This EUA will remain  in effect (meaning this test can be used) for the duration of the COVID-19 declaration under Section 56 4(b)(1) of the Act, 21 U.S.C. section 360bbb-3(b)(1), unless the authorization is terminated or revoked sooner. Performed at [facility], [place] [address], [place], [place] [address]   MRSA PCR Screening     Status: None   Collection Time: 11/27/18  7:31 AM   Specimen: Nasal Mucosa; Nasopharyngeal  Result Value Ref Range Status   MRSA by PCR NEGATIVE NEGATIVE Final    Comment:        The GeneXpert MRSA Assay (FDA approved for NASAL specimens only), is one component of a comprehensive MRSA colonization surveillance program. It is not intended to diagnose MRSA infection nor to guide or monitor treatment for MRSA infections. Performed at [facility], [place] [address], [place], [place] [address]   Aerobic/Anaerobic Culture (surgical/deep wound)     Status: None (Preliminary result)   Collection Time: 11/28/18 10:40 AM   Specimen: Abscess; Urine  Result Value Ref Range Status   Specimen Description ABSCESS LEFT NEPHROSTOMY PLACEMENT  Final   Special Requests Normal  Final   Gram Stain   Final    ABUNDANT WBC PRESENT,BOTH PMN AND MONONUCLEAR NO ORGANISMS SEEN    Culture   Final    FEW FUSOBACTERIUM  NUCLEATUM BETA LACTAMASE NEGATIVE Performed at [facility], [place] [address], [place], [place] [address]    Report Status PENDING  Incomplete    Studies/Results: No results found.     Anastashia Westerfeld D Sylina Henion 12/02/2018, 7:01 PM

## 2018-12-02 NOTE — Progress Notes (Signed)
PT Cancellation Note  Patient Details Name: [name] MRN: [id number] DOB: 1965-06-05   Cancelled Treatment:    Reason Eval/Treat Not Completed: Patient at procedure or test/unavailable.  Will reattempt as time and pt allow.   Ramond Dial 12/02/2018, 3:38 PM   Mee Hives, PT MS Acute Rehab Dept. Number: [place] and [place]

## 2018-12-03 ENCOUNTER — Encounter (HOSPITAL_COMMUNITY): Payer: Self-pay | Admitting: Emergency Medicine

## 2018-12-03 ENCOUNTER — Inpatient Hospital Stay (HOSPITAL_COMMUNITY): Payer: Medicare Other | Admitting: Registered Nurse

## 2018-12-03 ENCOUNTER — Encounter (HOSPITAL_COMMUNITY)
Admission: EM | Disposition: A | Discharge status: Home Health | Payer: Self-pay | Source: Home / Self Care | Attending: Student

## 2018-12-03 DIAGNOSIS — N132 Hydronephrosis with renal and ureteral calculous obstruction: Secondary | ICD-10-CM | POA: Diagnosis not present

## 2018-12-03 DIAGNOSIS — N200 Calculus of kidney: Secondary | ICD-10-CM | POA: Diagnosis not present

## 2018-12-03 DIAGNOSIS — K648 Other hemorrhoids: Secondary | ICD-10-CM | POA: Diagnosis not present

## 2018-12-03 DIAGNOSIS — R933 Abnormal findings on diagnostic imaging of other parts of digestive tract: Secondary | ICD-10-CM | POA: Diagnosis not present

## 2018-12-03 DIAGNOSIS — B9689 Other specified bacterial agents as the cause of diseases classified elsewhere: Secondary | ICD-10-CM

## 2018-12-03 DIAGNOSIS — R109 Unspecified abdominal pain: Secondary | ICD-10-CM | POA: Diagnosis not present

## 2018-12-03 DIAGNOSIS — I872 Venous insufficiency (chronic) (peripheral): Secondary | ICD-10-CM

## 2018-12-03 DIAGNOSIS — Z936 Other artificial openings of urinary tract status: Secondary | ICD-10-CM

## 2018-12-03 DIAGNOSIS — N10 Acute tubulo-interstitial nephritis: Secondary | ICD-10-CM

## 2018-12-03 DIAGNOSIS — F419 Anxiety disorder, unspecified: Secondary | ICD-10-CM

## 2018-12-03 DIAGNOSIS — F329 Major depressive disorder, single episode, unspecified: Secondary | ICD-10-CM

## 2018-12-03 DIAGNOSIS — Z9119 Patient's noncompliance with other medical treatment and regimen: Secondary | ICD-10-CM

## 2018-12-03 DIAGNOSIS — Z932 Ileostomy status: Secondary | ICD-10-CM

## 2018-12-03 DIAGNOSIS — Q059 Spina bifida, unspecified: Secondary | ICD-10-CM

## 2018-12-03 DIAGNOSIS — Z95828 Presence of other vascular implants and grafts: Secondary | ICD-10-CM

## 2018-12-03 DIAGNOSIS — N186 End stage renal disease: Secondary | ICD-10-CM | POA: Diagnosis not present

## 2018-12-03 DIAGNOSIS — R7881 Bacteremia: Secondary | ICD-10-CM

## 2018-12-03 HISTORY — PX: FLEXIBLE SIGMOIDOSCOPY: SHX5431

## 2018-12-03 LAB — RENAL FUNCTION PANEL
Albumin: 2.6 g/dL — ABNORMAL LOW (ref 3.5–5.0)
Anion gap: 19 — ABNORMAL HIGH (ref 5–15)
BUN: 82 mg/dL — ABNORMAL HIGH (ref 6–20)
CO2: 21 mmol/L — ABNORMAL LOW (ref 22–32)
Calcium: 8.9 mg/dL (ref 8.9–10.3)
Chloride: 94 mmol/L — ABNORMAL LOW (ref 98–111)
Creatinine, Ser: 11.11 mg/dL — ABNORMAL HIGH (ref 0.61–1.24)
GFR calc Af Amer: 5 mL/min — ABNORMAL LOW (ref >60)
GFR calc non Af Amer: 5 mL/min — ABNORMAL LOW (ref >60)
Glucose, Bld: 95 mg/dL (ref 70–99)
Phosphorus: 4.6 mg/dL (ref 2.5–4.6)
Potassium: 5.2 mmol/L — ABNORMAL HIGH (ref 3.5–5.1)
Sodium: 134 mmol/L — ABNORMAL LOW (ref 135–145)

## 2018-12-03 LAB — CBC
HCT: 27.4 % — ABNORMAL LOW (ref 39.0–52.0)
Hemoglobin: 8.6 g/dL — ABNORMAL LOW (ref 13.0–17.0)
MCH: 30.2 pg (ref 26.0–34.0)
MCHC: 31.4 g/dL (ref 30.0–36.0)
MCV: 96.1 fL (ref 80.0–100.0)
Platelets: 173 10*3/uL (ref 150–400)
RBC: 2.85 MIL/uL — ABNORMAL LOW (ref 4.22–5.81)
RDW: 15 % (ref 11.5–15.5)
WBC: 10 10*3/uL (ref 4.0–10.5)
nRBC: 0 % (ref 0.0–0.2)

## 2018-12-03 LAB — AEROBIC/ANAEROBIC CULTURE W GRAM STAIN (SURGICAL/DEEP WOUND): Special Requests: NORMAL

## 2018-12-03 SURGERY — SIGMOIDOSCOPY, FLEXIBLE
Anesthesia: Monitor Anesthesia Care

## 2018-12-03 MED ORDER — CALCIUM ACETATE (PHOS BINDER) 667 MG PO CAPS
667.0000 mg | ORAL_CAPSULE | Freq: Three times a day (TID) | ORAL | Status: DC
  Administered 2018-12-03 – 2018-12-10 (×16): 667 mg via ORAL
  Filled 2018-12-03 (×15): qty 1

## 2018-12-03 MED ORDER — PHENYLEPHRINE 40 MCG/ML (10ML) SYRINGE FOR IV PUSH (FOR BLOOD PRESSURE SUPPORT)
PREFILLED_SYRINGE | INTRAVENOUS | Status: DC | PRN
  Administered 2018-12-03 (×2): 120 ug via INTRAVENOUS
  Administered 2018-12-03: 80 ug via INTRAVENOUS
  Administered 2018-12-03 (×3): 120 ug via INTRAVENOUS

## 2018-12-03 MED ORDER — CEFAZOLIN SODIUM-DEXTROSE 2-4 GM/100ML-% IV SOLN
2.0000 g | INTRAVENOUS | Status: DC
  Filled 2018-12-03: qty 100

## 2018-12-03 MED ORDER — PROPOFOL 10 MG/ML IV BOLUS
INTRAVENOUS | Status: DC | PRN
  Administered 2018-12-03: 20 mg via INTRAVENOUS
  Administered 2018-12-03: 30 mg via INTRAVENOUS

## 2018-12-03 MED ORDER — CHLORHEXIDINE GLUCONATE CLOTH 2 % EX PADS
6.0000 | MEDICATED_PAD | Freq: Every day | CUTANEOUS | Status: DC
  Administered 2018-12-05: 6 via TOPICAL

## 2018-12-03 MED ORDER — EPHEDRINE SULFATE-NACL 50-0.9 MG/10ML-% IV SOSY
PREFILLED_SYRINGE | INTRAVENOUS | Status: DC | PRN
  Administered 2018-12-03: 10 mg via INTRAVENOUS
  Administered 2018-12-03: 20 mg via INTRAVENOUS
  Administered 2018-12-03 (×2): 10 mg via INTRAVENOUS

## 2018-12-03 MED ORDER — LIDOCAINE 2% (20 MG/ML) 5 ML SYRINGE
INTRAMUSCULAR | Status: DC | PRN
  Administered 2018-12-03: 40 mg via INTRAVENOUS

## 2018-12-03 MED ORDER — SODIUM CHLORIDE 0.9 % IV SOLN
INTRAVENOUS | Status: DC | PRN
  Administered 2018-12-03: 09:00:00 via INTRAVENOUS

## 2018-12-03 MED ORDER — PROPOFOL 500 MG/50ML IV EMUL
INTRAVENOUS | Status: DC | PRN
  Administered 2018-12-03: 75 ug/kg/min via INTRAVENOUS

## 2018-12-03 NOTE — Progress Notes (Signed)
Spoke with patient regarding his test this am. Amenable to do his prep. Dulcolax supp given.

## 2018-12-03 NOTE — Progress Notes (Signed)
PT Cancellation Note  Patient Details Name: [name] MRN: [id number] DOB: [birth year]   Cancelled Treatment:    Reason Eval/Treat Not Completed: Other (comment).  Per pt request, with MD approval who was seeing when PT arrived, pt is being discharged from PT.  He does not feel he needs it, and shared with him that PT can return if he changes his mind.  Pt voices understanding.   Ramond Dial 12/03/2018, 11:04 AM   Mee Hives, PT MS Acute Rehab Dept. Number: [place] and [place]

## 2018-12-03 NOTE — Anesthesia Preprocedure Evaluation (Signed)
Anesthesia Evaluation  Patient identified by MRN, date of birth, ID band Patient awake    Reviewed: Allergy & Precautions, NPO status , Patient's Chart, lab work & pertinent test results  Airway Mallampati: II  TM Distance: >3 FB Neck ROM: Full    Dental   Pulmonary neg pulmonary ROS,    Pulmonary exam normal        Cardiovascular hypertension, Pt. on medications + Peripheral Vascular Disease  Normal cardiovascular exam     Neuro/Psych Seizures -,  Anxiety    GI/Hepatic Neg liver ROS, GERD  Medicated and Controlled,  Endo/Other  Hypothyroidism   Renal/GU ESRF and DialysisRenal disease     Musculoskeletal   Abdominal   Peds  Hematology  (+) anemia ,   Anesthesia Other Findings   Reproductive/Obstetrics                             Anesthesia Physical  Anesthesia Plan  ASA: III  Anesthesia Plan: MAC   Post-op Pain Management:    Induction:   PONV Risk Score and Plan: 1 and Propofol infusion and Ondansetron  Airway Management Planned: Natural Airway and Simple Face Mask  Additional Equipment:   Intra-op Plan:   Post-operative Plan:   Informed Consent: I have reviewed the patients History and Physical, chart, labs and discussed the procedure including the risks, benefits and alternatives for the proposed anesthesia with the patient or authorized representative who has indicated his/her understanding and acceptance.       Plan Discussed with: CRNA and Surgeon  Anesthesia Plan Comments:         Anesthesia Quick Evaluation

## 2018-12-03 NOTE — Progress Notes (Signed)
   12/03/18 0900  PT Visit Information  Last PT Received On 12/03/18  Reason Eval/Treat Not Completed Patient at procedure or test/unavailable   Pt in endoscopy and will reattempt at another time.  Mee Hives, PT MS Acute Rehab Dept. Number: [place] and [place]

## 2018-12-03 NOTE — Transfer of Care (Signed)
Immediate Anesthesia Transfer of Care Note  Patient: [name]  Procedure(s) Performed: FLEXIBLE SIGMOIDOSCOPY (N/A )  Patient Location: PACU and Endoscopy Unit  Anesthesia Type:MAC  Level of Consciousness: awake, alert  and oriented  Airway & Oxygen Therapy: Patient Spontanous Breathing  Post-op Assessment: Report given to RN and Post -op Vital signs reviewed and stable  Post vital signs: Reviewed and stable  Last Vitals:  Vitals Value Taken Time  BP 107/34 12/03/18 0943  Temp 36.2 C 12/03/18 0935  Pulse 80 12/03/18 0943  Resp 23 12/03/18 0943  SpO2 96 % 12/03/18 0943  Vitals shown include unvalidated device data.  Last Pain:  Vitals:   12/03/18 0943  TempSrc:   PainSc: 5       Patients Stated Pain Goal: 2 (58/06/38 6854)  Complications: No apparent anesthesia complications

## 2018-12-03 NOTE — Progress Notes (Signed)
[facility] Progress Note   Subjective:  Seen in room - pain a little better. Concerned about renal diet - ok'd him for peach cobbler with lunch. He is interested in 2nd opinion from urology - will disc with primary team. Denies CP/dyspnea at this time. Underwent flex sig this morning which showed hemorrhoids only.  Objective Vitals:   12/03/18 0952 12/03/18 0959 12/03/18 1000 12/03/18 1024  BP: (!) 92/26 (!) 92/33 (!) 97/43 120/66  Pulse: 81 80 80 78  Resp: 19 18 14 18   Temp:    98.7 F (37.1 C)  TempSrc:    Oral  SpO2: 97% 97% 97% 98%  Weight:      Height:       Physical Exam General: Chronically ill appearing man, pain more controlled today. Heart: RRR; 3/6 systolic murmur Lungs: CTAB; no rales Abdomen: soft. L flank with perc nephrostomy drain - brownish/bloody drainage in bag Extremities: Trace LLE edema Dialysis Access: R femoral cath  Additional Objective Labs: Basic Metabolic Panel: Recent Labs  Lab 12/01/18 0501 12/02/18 0639 12/03/18 0428  NA 133* 134* 134*  K 4.7 5.3* 5.2*  CL 95* 94* 94*  CO2 21* 23 21*  GLUCOSE 107* 87 95  BUN 86* 103* 82*  CREATININE 10.73* 12.93* 11.11*  CALCIUM 9.2 9.4 8.9  PHOS 2.9 4.0 4.6   Liver Function Tests: Recent Labs  Lab 11/27/18 0610  12/01/18 0501 12/02/18 0639 12/03/18 0428  AST 26  --   --   --   --   ALT 20  --   --   --   --   ALKPHOS 137*  --   --   --   --   BILITOT 2.1*  --   --   --   --   PROT 8.2*  --   --   --   --   ALBUMIN 2.8*   < > 2.6* 2.6* 2.6*   < > = values in this interval not displayed.   CBC: Recent Labs  Lab 11/29/18 1210 11/30/18 0453 12/01/18 0501 12/02/18 0639 12/03/18 0428  WBC 11.7* 9.5 13.3* 10.4 10.0  HGB 9.4* 8.7* 9.6* 9.0* 8.6*  HCT 29.4* 27.6* 29.7* 27.7* 27.4*  MCV 94.2 96.8 94.3 95.2 96.1  PLT 161 147* 184 181 173   Blood Culture    Component Value Date/Time   SDES ABSCESS LEFT NEPHROSTOMY PLACEMENT 11/28/2018 1040   SPECREQUEST Normal  11/28/2018 1040   CULT  11/28/2018 1040    FEW FUSOBACTERIUM NUCLEATUM BETA LACTAMASE NEGATIVE Performed at [facility] [address] [place], [place] [address]    REPTSTATUS 12/03/2018 FINAL 11/28/2018 1040   Studies/Results: Ct Abdomen Pelvis Wo Contrast  Result Date: 12/01/2018 CLINICAL DATA:  53 year old male with history of flank pain. EXAM: CT ABDOMEN AND PELVIS WITHOUT CONTRAST TECHNIQUE: Multidetector CT imaging of the abdomen and pelvis was performed following the standard protocol without IV contrast. COMPARISON:  CT the abdomen and pelvis 11/26/2018. FINDINGS: Lower chest: Areas of mild scarring are noted throughout the lung bases bilaterally. Dilatation of the pulmonic trunk (3.7 cm in diameter), concerning for pulmonary arterial hypertension. Severe calcifications of the aortic valve and mitral annulus. Atherosclerotic calcifications in the left main, left anterior descending, left circumflex and right coronary arteries. Hepatobiliary: No definite suspicious cystic or solid hepatic lesions are confidently identified on today's noncontrast CT examination. Low-attenuation throughout the hepatic parenchyma, indicative of hepatic steatosis. Unenhanced appearance of the gallbladder is normal. Pancreas: No  definite pancreatic mass or peripancreatic fluid collections or inflammatory changes are noted on today's noncontrast CT examination. Spleen: Unremarkable. Adrenals/Urinary Tract: Severe atrophy of the right kidney. Percutaneous nephrostomy tube in the left kidney with tip reformed in the left renal pelvis. Multiple nonobstructive calculi are noted within the collecting systems of both kidneys measuring up to 1 cm in diameter bilaterally. Low-attenuation lesion in the lower pole of the left kidney measuring 4.2 cm in diameter incompletely characterized on today's non-contrast CT examination, but similar to the prior study and statistically likely to represent a cyst. In the proximal  third of the left ureter there is a 4 mm calculus (coronal image 86 of series 6). There is profound thickening of the left ureter with surrounding inflammatory changes. Urinary bladder is completely decompressed. Right lower quadrant urostomy. Bilateral adrenal glands are normal in appearance. Stomach/Bowel: Unenhanced appearance of the stomach is normal. No pathologic dilatation of small bowel or colon. The appendix is not confidently identified and may be surgically absent. Regardless, there are no inflammatory changes noted adjacent to the cecum to suggest the presence of an acute appendicitis at this time. Vascular/Lymphatic: Aortic atherosclerosis. Vascular stent in the left common iliac vein. Right common femoral central venous catheter with tip terminating in the infra hepatic IVC. No lymphadenopathy noted in the abdomen or pelvis. No lymphadenopathy noted in the abdomen or pelvis. Reproductive: Prostate gland and seminal vesicles are unremarkable in appearance. Other: No significant volume of ascites.  No pneumoperitoneum. Musculoskeletal: There are no aggressive appearing lytic or blastic lesions noted in the visualized portions of the skeleton. IMPRESSION: 1. In addition to multiple nonobstructive calculi in the collecting systems of both kidneys there is a 4 mm calculus in the proximal third of the left ureter. Notably, there is diffuse soft tissue thickening associated with the left ureter with extensive surrounding inflammatory changes. This is presumably infectious/inflammatory in etiology, although infiltrative neoplasm along the course of the ureter is not entirely excluded. 2. Percutaneous left nephrostomy tube appears appropriately positioned. There is no left hydronephrosis. 3. Dilatation of the pulmonic trunk (3.7 cm in diameter), concerning for pulmonary arterial hypertension. 4. Aortic atherosclerosis, in addition to left main and 3 vessel coronary artery disease. Please note that although the  presence of coronary artery calcium documents the presence of coronary artery disease, the severity of this disease and any potential stenosis cannot be assessed on this non-gated CT examination. Assessment for potential risk factor modification, dietary therapy or pharmacologic therapy may be warranted, if clinically indicated. 5. Additional incidental findings, as above. Electronically Signed   By: Vinnie Langton M.D.   On: 12/01/2018 16:03   Medications: . [START ON 12/04/2018]  ceFAZolin (ANCEF) IV    . cefTRIAXone (ROCEPHIN)  IV 1 g (12/02/18 1740)   . ALPRAZolam  0.5 mg Oral Once  . Chlorhexidine Gluconate Cloth  6 each Topical Daily  . famotidine  20 mg Oral Q1400  . feeding supplement (NEPRO CARB STEADY)  237 mL Oral BID BM  . levothyroxine  125 mcg Oral QAC breakfast  . LORazepam  1 mg Oral Daily  . polyethylene glycol  17 g Oral BID  . sodium chloride flush  5 mL Intracatheter Q8H  . sodium polystyrene  15 g Oral Once per day on Sun Sat  . traZODone  100 mg Oral QHS    Dialysis Orders: MWF at [facility] 4:45hr, 350/800, EDW 123kg (getting under), 2K/2.25Ca, TDC, heparin 3700 bolus - Hectoral 60mcg IV q HD - Mircera  15mcg IV q 4 (last 11/22) - Venofer 100 x 10 ordered  Assessment/Plan: 1.L ureteral calculi/pyelonephritis:With moderate hydronephrosis and calyceal rupture. S/p L percutaneous nephrostromy tube placement by IR on 11/26.    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 There was concern as this bacteria is associated with colon malignancies - s/p flex sig this morning which was negative.   # Urology recommending L nephrectomy - consideration B nephrectomy with takedown urostomy - patient interested in 2nd opinion.  2. ESRD:Continue HD on MWF schedule - for HD 12/2. 3.HTN/volume:BPok, under previous outpatient EDW.UF as tolerated. 4. Anemia:Hgb8.6, not due  for ESA yet. Will hold IV Fe while on IV antibiotics. 5. Secondary hyperparathyroidism:Corrected calciumhigh, hectorol reduced then held.Phos low, calcium acetate decreased then held as well on 11/27. Phos now 4.6 - will resume at lower dose (1/meals). 6. Nutrition:Alb low, continue Nepro. 7. Spina bifida (neurogenic bladder)  [name], PA-C 12/03/2018, 12:35 PM  [facility] Pager: [phone]

## 2018-12-03 NOTE — Interval H&P Note (Signed)
History and Physical Interval Note:  12/03/2018 8:59 AM  [name]  has presented today for surgery, with the diagnosis of abnormal ct scan.  The various methods of treatment have been discussed with the patient and family. After consideration of risks, benefits and other options for treatment, the patient has consented to  Procedure(s): FLEXIBLE SIGMOIDOSCOPY (N/A) as a surgical intervention.  The patient's history has been reviewed, patient examined, no change in status, stable for surgery.  I have reviewed the patient's chart and labs.  Questions were answered to the patient's satisfaction.     [place]

## 2018-12-03 NOTE — Op Note (Signed)
[facility] Patient Name: [name] : 12/03/2018 MRN: [id number] Attending MD: Carlota Raspberry. Havery Moros , MD Keith of Birth: [birth year] CSN: [id number] Age: 53 Admit Type: Inpatient Procedure:                Flexible Sigmoidoscopy Indications:              Abnormal CT of the GI tract - prior CT scan 11/24                            suggests proctitis, severe flank pain presumed due                            to renal stones / pyelonephritis Providers:                Remo Lipps P. Havery Moros, MD, [facility], RN, Elspeth Cho Tech., Technician Referring MD:              Medicines:                Monitored Anesthesia Care Complications:            No immediate complications. Estimated blood loss:                            None. Estimated Blood Loss:     Estimated blood loss: none. Procedure:                Pre-Anesthesia Assessment:                           - Prior to the procedure, a History and Physical                            was performed, and patient medications and                            allergies were reviewed. The patient's tolerance of                            previous anesthesia was also reviewed. The risks                            and benefits of the procedure and the sedation                            options and risks were discussed with the patient.                            All questions were answered, and informed consent                            was obtained. Prior Anticoagulants: The patient has  taken Plavix (clopidogrel), last dose was 9 days                            prior to procedure. ASA Grade Assessment: III - A                            patient with severe systemic disease. After                            reviewing the risks and benefits, the patient was                            deemed in satisfactory condition to undergo the   procedure.                           After obtaining informed consent, the scope was                            passed under direct vision. The [id number]-[id number]                            [id number] Olympus pediatric colonoscope was                            introduced through the anus and advanced to the the                            sigmoid colon. The flexible sigmoidoscopy was                            accomplished without difficulty. The patient                            tolerated the procedure well. The quality of the                            bowel preparation was poor. Scope In: 9:25:32 AM Scope Out: 9:29:16 AM Total Procedure Duration: 0 hours 3 minutes 44 seconds  Findings:      Hemorrhoids were found on perianal exam.      A large amount of solid stool was found in the recto-sigmoid colon and       in the distal sigmoid colon, making visualization difficult in these       areas, which were not well evaluated.      Following time with lavage the rectum appeared normal - no mass lesions       or inflammation in regards to prior CT findings.      Internal hemorrhoids were found. Impression:               - Preparation of the colon was poor proximal to the                            rectum and the exam was inadequate to clear the  left colon.                           - Hemorrhoids found on perianal exam.                           - Internal hemorrhoids noted                           - The rectum is normal - no evidence of any                            pathology in regards to prior CT findings.                           Overall, no cause for the patient's symptoms on                            this limited exam, which shows no evidence of                            proctitis or a rectal mass in regards to prior CT                            findings. Pain most likely due to renal stones /                            renal infection, seems unlikely to be  related to                            his colon. Patient does not think he can tolerate                            bowel prep for full colonoscopy at this time, which                            I think may be low yield. Recommendation:           - Return patient to hospital ward for ongoing care.                           - Resume previous diet.                           - Continue present medications. Bowel regimen for                            chronic constipation, such as Miralax daily to                            twice daily, titrate as needed                           - GI service will sign off at this  time, please                            call with additional questions moving forward Procedure Code(s):        --- Professional ---                           [phone], Sigmoidoscopy, flexible; diagnostic,                            including collection of specimen(s) by brushing or                            washing, when performed (separate procedure) Diagnosis Code(s):        --- Professional ---                           K64.8, Other hemorrhoids                           R93.3, Abnormal findings on diagnostic imaging of                            other parts of digestive tract CPT copyright 2019 [employer]. All rights reserved. The codes documented in this report are preliminary and upon coder review may  be revised to meet current compliance requirements. Remo Lipps P. Mariaguadalupe Fialkowski, MD 12/03/2018 9:42:39 AM This report has been signed electronically. Number of Addenda: 0

## 2018-12-03 NOTE — Progress Notes (Signed)
Administered half of one tap water enema, pt refused to take the rest of the enema and the second one. Endo was notified.    Paulla Fore, RN, BSN

## 2018-12-03 NOTE — Plan of Care (Signed)
  Problem: Education: Goal: Knowledge of General Education information will improve Description: Including pain rating scale, medication(s)/side effects and non-pharmacologic comfort measures Outcome: Completed/Met   Problem: Health Behavior/Discharge Planning: Goal: Ability to manage health-related needs will improve Outcome: Completed/Met   Problem: Clinical Measurements: Goal: Diagnostic test results will improve Outcome: Completed/Met   Problem: Education: Goal: Knowledge of disease and its progression will improve Outcome: Completed/Met   Problem: Fluid Volume: Goal: Compliance with measures to maintain balanced fluid volume will improve Outcome: Completed/Met

## 2018-12-03 NOTE — Progress Notes (Signed)
Pt back from Endo, alert and oriented x4, VSS and pt c/o pain 10/10 generalized. Administered pain med per orders. No distress noted.    Paulla Fore, RN, BSN.

## 2018-12-03 NOTE — Progress Notes (Signed)
[facility]   Reason for visit: Follow up on renal abscess/Fusobacterium bacteremia  Antibiotics: Cefazolin, day 1 + ceftriaxone x 7 days  Interval History: Patient continues to complain of right flank pain.  His ileostomy bag is directly overlying his hemodialysis catheter access with hubs that are unbandaged/uncovered.  Appetite remains poor.  Patient has ambulated from his bed to his chair but otherwise has had minimal ambulation today per nursing report.  Nursing reports that the patient primarily requests narcotics frequently throughout the day and has been sleeping otherwise.  Right-sided nephrostomy drain is intact. Fever curve, WBC & Cr trends, imaging, cx results, and ABX usage all independently reviewed    Current Facility-Administered Medications:  .  acetaminophen (TYLENOL) tablet 650 mg, 650 mg, Oral, Q6H PRN, 650 mg at 11/26/18 0920 **OR** acetaminophen (TYLENOL) suppository 650 mg, 650 mg, Rectal, Q6H PRN, Jani Gravel, MD .  ALPRAZolam Duanne Moron) tablet 0.5 mg, 0.5 mg, Oral, Once, Pearson Grippe B, MD .  calcium acetate (PHOSLO) capsule 667 mg, 667 mg, Oral, TID WC, [name], Woodfin Ganja, PA-C .  [START ON 12/04/2018] ceFAZolin (ANCEF) IVPB 2g/100 mL premix, 2 g, Intravenous, Q M,W,F-1800, Carlyle Basques, MD .  cefTRIAXone (ROCEPHIN) 1 g in sodium chloride 0.9 % 100 mL IVPB, 1 g, Intravenous, Q24H, Carlyle Basques, MD, Last Rate: 200 mL/hr at 12/02/18 1740, 1 g at 12/02/18 1740 .  [START ON 12/04/2018] Chlorhexidine Gluconate Cloth 2 % PADS 6 each, 6 each, Topical, Q0600, [name], Kathryn R, PA-C .  famotidine (PEPCID) tablet 20 mg, 20 mg, Oral, Q1400, Barton Dubois, MD, 20 mg at 12/02/18 1333 .  feeding supplement (NEPRO CARB STEADY) liquid 237 mL, 237 mL, Oral, BID BM, Gonfa, Taye T, MD, Last Rate: 0 mL/hr at 11/28/18 1537, 237 mL at 12/02/18 0804 .  feeding supplement (NEPRO CARB STEADY) liquid 237 mL, 237 mL, Oral, TID PRN, Cyndia Skeeters, Taye T, MD .   fluticasone (FLONASE) 50 MCG/ACT nasal spray 2 spray, 2 spray, Each Nare, PRN, Jani Gravel, MD .  heparin injection 5,800 Units, 5,800 Units, Intravenous, Q dialysis, Elmarie Shiley, MD, 5,800 Units at 12/02/18 1724 .  HYDROmorphone (DILAUDID) injection 1-2 mg, 1-2 mg, Intravenous, Q4H PRN, Roney Jaffe, MD, 2 mg at 12/03/18 1045 .  levothyroxine (SYNTHROID) tablet 125 mcg, 125 mcg, Oral, QAC breakfast, Jani Gravel, MD, 125 mcg at 12/03/18 1038 .  LORazepam (ATIVAN) tablet 1 mg, 1 mg, Oral, Daily, Jani Gravel, MD, 1 mg at 12/03/18 1038 .  ondansetron (ZOFRAN) injection 4 mg, 4 mg, Intravenous, Q6H PRN, Barton Dubois, MD, 4 mg at 12/03/18 [id number] .  [DISCONTINUED] oxyCODONE-acetaminophen (PERCOCET/ROXICET) 5-325 MG per tablet 1 tablet, 1 tablet, Oral, Q6H PRN, 1 tablet at 12/01/18 0730 **AND** oxyCODONE (Oxy IR/ROXICODONE) immediate release tablet 5 mg, 5 mg, Oral, Q6H PRN, Barton Dubois, MD, 5 mg at 12/03/18 0445 .  oxyCODONE-acetaminophen (PERCOCET) 7.5-325 MG per tablet 1 tablet, 1 tablet, Oral, Q6H PRN, Wendee Beavers T, MD, 1 tablet at 12/03/18 1258 .  polyethylene glycol (MIRALAX / GLYCOLAX) packet 17 g, 17 g, Oral, BID, Gonfa, Taye T, MD, 17 g at 12/01/18 1256 .  sodium chloride flush (NS) 0.9 % injection 5 mL, 5 mL, Intracatheter, Q8H, Arne Cleveland, MD, 5 mL at 12/03/18 0600 .  sodium polystyrene (KAYEXALATE) powder 15 g, 15 g, Oral, Once per day on Sun Sat, Kim, Jeneen Rinks, MD .  traZODone (DESYREL) tablet 100 mg, 100 mg, Oral, Loma Sousa, MD, 100 mg at 12/02/18 2249 .  zolpidem (AMBIEN) tablet 10 mg, 10 mg, Oral, QHS PRN, Wendee Beavers T, MD, 10 mg at 12/02/18 2249   Physical Exam:   Vitals:   12/03/18 1000 12/03/18 1024  BP: (!) 97/43 120/66  Pulse: 80 78  Resp: 14 18  Temp:  98.7 F (37.1 C)  SpO2: 97% 98%   Physical Exam Lines: RT nephrostomy tube, RT femoral permacath, RLQ ileostomy Gen: argumentative at times, mild distress secondary to RT flank pain, A&Ox 3 Head: NCAT, no temporal  wasting evident EENT: PERRL, EOMI, MMM, adequate dentition Neck: supple, no JVD CV: NRRR, no murmurs evident Pulm: CTA bilaterally, no wheeze or retractions Abd: soft, obese, +ileostomy at RLQ, NTND, +BS GU: RT nephrostomy intact, ?RT CVA tenderness Extrems: 1+ non-pitting LE edema, 1+ pulses Skin: chronic venous stasis dermatitis to both LEs, adequate skin turgor Neuro: CN II-XII grossly intact, no focal neurologic deficits appreciated, gait was not assessed, A&Ox 3   Review of Systems:  Review of Systems  Constitutional: Positive for malaise/fatigue. Negative for chills, fever and weight loss.  HENT: Negative for congestion, hearing loss, sinus pain and sore throat.   Eyes: Negative for blurred vision, photophobia and discharge.  Respiratory: Negative for cough, hemoptysis and shortness of breath.   Cardiovascular: Negative for chest pain, palpitations, orthopnea and leg swelling.  Gastrointestinal: Negative for abdominal pain, constipation, diarrhea, heartburn, nausea and vomiting.  Genitourinary: Positive for flank pain. Negative for dysuria, frequency and urgency.  Musculoskeletal: Positive for back pain and myalgias. Negative for joint pain.  Skin: Negative for itching and rash.  Neurological: Negative for tremors, seizures, weakness and headaches.  Endo/Heme/Allergies: Negative for polydipsia. Does not bruise/bleed easily.  Psychiatric/Behavioral: Positive for depression. Negative for substance abuse. The patient is nervous/anxious. The patient does not have insomnia.      Lab Results  Component Value Date   WBC 10.0 12/03/2018   HGB 8.6 (L) 12/03/2018   HCT 27.4 (L) 12/03/2018   MCV 96.1 12/03/2018   PLT 173 12/03/2018    Lab Results  Component Value Date   CREATININE 11.11 (H) 12/03/2018   BUN 82 (H) 12/03/2018   NA 134 (L) 12/03/2018   K 5.2 (H) 12/03/2018   CL 94 (L) 12/03/2018   CO2 21 (L) 12/03/2018    Lab Results  Component Value Date   ALT 20 11/27/2018    AST 26 11/27/2018   ALKPHOS 137 (H) 11/27/2018     Microbiology: Recent Results (from the past 240 hour(s))  SARS CORONAVIRUS 2 (TAT 6-24 HRS) Nasopharyngeal Nasopharyngeal Swab     Status: None   Collection Time: 11/26/18  1:37 PM   Specimen: Nasopharyngeal Swab  Result Value Ref Range Status   SARS Coronavirus 2 NEGATIVE NEGATIVE Final    Comment: (NOTE) SARS-CoV-2 target nucleic acids are NOT DETECTED. The SARS-CoV-2 RNA is generally detectable in upper and lower respiratory specimens during the acute phase of infection. Negative results do not preclude SARS-CoV-2 infection, do not rule out co-infections with other pathogens, and should not be used as the sole basis for treatment or other patient management decisions. Negative results must be combined with clinical observations, patient history, and epidemiological information. The expected result is Negative. Fact Sheet for Patients: SugarRoll.be Fact Sheet for Healthcare Providers: https://www.woods-mathews.com/ This test is not yet approved or cleared by the [place] FDA and  has been authorized for detection and/or diagnosis of SARS-CoV-2 by FDA under an Emergency Use Authorization (EUA). This EUA will remain  in effect (meaning this  test can be used) for the duration of the COVID-19 declaration under Section 56 4(b)(1) of the Act, 21 U.S.C. section 360bbb-3(b)(1), unless the authorization is terminated or revoked sooner. Performed at [facility], [place] [address], [place], [place] [address]   MRSA PCR Screening     Status: None   Collection Time: 11/27/18  7:31 AM   Specimen: Nasal Mucosa; Nasopharyngeal  Result Value Ref Range Status   MRSA by PCR NEGATIVE NEGATIVE Final    Comment:        The GeneXpert MRSA Assay (FDA approved for NASAL specimens only), is one component of a comprehensive MRSA colonization surveillance program. It is not intended to diagnose  MRSA infection nor to guide or monitor treatment for MRSA infections. Performed at [facility], [place] [address] [place], [place] [address]   Aerobic/Anaerobic Culture (surgical/deep wound)     Status: None   Collection Time: 11/28/18 10:40 AM   Specimen: Abscess; Urine  Result Value Ref Range Status   Specimen Description ABSCESS LEFT NEPHROSTOMY PLACEMENT  Final   Special Requests Normal  Final   Gram Stain   Final    ABUNDANT WBC PRESENT,BOTH PMN AND MONONUCLEAR NO ORGANISMS SEEN    Culture   Final    FEW FUSOBACTERIUM NUCLEATUM BETA LACTAMASE NEGATIVE Performed at [facility], [place] [address] [place],  [address]    Report Status 12/03/2018 FINAL  Final    Impression/Plan: The patient is a 53 year old white male with spina bifida, status post ileostomy and history of staghorn calculi requiring nephrostomy drains in the past and history of a chronic right perinephric fluid collection now with concerns for right perinephric abscess.  1.  Right perinephric abscess -the patient has had an aspirate performed and a right nephrostomy tube placed for drainage of his complex fluid collection.  In discussion with the patient, he has known that he has had a right sided perinephric fluid collection since 2004.  This occurred following an episode in which he had had a second lithotripsy performed for treatment of a staghorn calculi to his right kidney and placement of a double-J stent.  The patient was noncompliant with instructions to have his stent removed postoperatively and it remained in place for 5 to 6 weeks.  Per the patient, when he had his double-J stent removed it was heavily calcified within his ureter and he developed his right perinephric fluid collection shortly thereafter.  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  Given the chronicity of the patient's abscess, it is reasonable to treat for 6 weeks total duration with a tentative stop date of January 06, 2019.  Repeat imaging will likely be needed to determine final duration of antibiotics in total.  The patient will follow up with Dr. Baxter Flattery on Tuesday December 24, 2018 at 3:15 PM as an outpatient to discuss the value of oral consolidative treatment thereafter.  Will sign off, please call with any questions

## 2018-12-03 NOTE — Anesthesia Postprocedure Evaluation (Signed)
Anesthesia Post Note  Patient: Osceola Depaz  Procedure(s) Performed: FLEXIBLE SIGMOIDOSCOPY (N/A )     Patient location during evaluation: PACU Anesthesia Type: MAC Level of consciousness: awake and alert Pain management: pain level controlled Vital Signs Assessment: post-procedure vital signs reviewed and stable Respiratory status: spontaneous breathing, nonlabored ventilation, respiratory function stable and patient connected to nasal cannula oxygen Cardiovascular status: stable and blood pressure returned to baseline Postop Assessment: no apparent nausea or vomiting Anesthetic complications: no    Last Vitals:  Vitals:   12/03/18 1000 12/03/18 1024  BP: (!) 97/43 120/66  Pulse: 80 78  Resp: 14 18  Temp:  37.1 C  SpO2: 97% 98%    Last Pain:  Vitals:   12/03/18 1258  TempSrc:   PainSc: 10-Worst pain ever                 Tiajuana Amass

## 2018-12-03 NOTE — Progress Notes (Addendum)
PROGRESS NOTE  [name] [id number] DOB: 05/13/1965   PCP: [name], MD  Patient is from: Home  DOA: 11/25/2018 LOS: 7  Brief Narrative / Interim history: 53 year old male with history of ESRD on HD MWF, spinal bifid, neurogenic bladder/ileal conduit, seizures, migraine headache, HTN/HoTN, kidney stones, GERD and anxiety presenting with left flank pain on 11/25/2018 and admitted for left moderate hydronephrosis, calyceal rupture, nonobstructing bilateral nephrolithiasis and developing pyelonephritis as noted on CT. Nephrology and urology consulted.   Patient was started on IV ceftriaxone for developing pyelonephritis.  Urology recommended left percutaneous nephrostomy by IR, and outpatient follow-up.  Seen by IR 11/26/2018.  Per IR note, declined percutaneous nephrostomy, asking for nephrectomy. However, he later agreed to nephrostomy tube placement.  IR reconsulted 11/27/2018.  Underwent left nephrostomy tube placement on 11/28/2018. Nephrostomy output minimal but with dark red urine. 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 Patient continues to endorse severe left flank pain.  Urology reconsulted and recommended adjustment of his pain medications, treating his constipation,  continuing appropriate antibiotics and outpatient follow-up to discuss further plan.  Unfortunately, pain was not well controlled with high-dose opiates including IV Dilaudid and Percocet.  CT abdomen and pelvis without contrast obtained on 12/01/2018 and revealed previously noted multiple nonobstructing calculi in the collecting systems of both kidneys, 4 mm calculus in the proximal third of left ureter and diffuse soft tissue thickening associated with the left ureter with extensive surrounding inflammatory changes and appropriately positioned PCN.  Urology reconsulted 12/02/2018 again but dismissed by patient.  Urology suggested transfer to tertiary/academic center for  further care.  12/03/2018-discussed with urologist, Dr. Alroy Dust at [facility] who declined transfer stating they have no additional intervention to offer.  Also discussed with Dr. Sheral Flow, urologist at [facility] who declined transfer for the same reason.  ID consulted for higher level/possible renal abscess are recommended IV Ancef with HD for 4 weeks followed by p.o. amoxicillin for 2 weeks, and outpatient follow-up in ID clinic in 3 to 4 weeks.  [facility] consulted for proctitis noted on initial CT. sigmoidoscopy on 12/03/2018 without significant finding.  Recommended good bowel regimen for constipation and signed off.  Subjective: Patient continues to endorse significant left flank pain.  Pain is improved with IV Dilaudid but returns when Dilaudid wears off.  Patient is hesitant about transfer to tertiary center stating that that nephrology is looking for another urologist to evaluate him although this is outpatient referral [facility] group per Dr. Joelyn Oms, nephrology.   Objective: Vitals:   12/03/18 0952 12/03/18 0959 12/03/18 1000 12/03/18 1024  BP: (!) 92/26 (!) 92/33 (!) 97/43 120/66  Pulse: 81 80 80 78  Resp: 19 18 14 18   Temp:    98.7 F (37.1 C)  TempSrc:    Oral  SpO2: 97% 97% 97% 98%  Weight:      Height:        Intake/Output Summary (Last 24 hours) at 12/03/2018 1447 Last data filed at 12/03/2018 0933 Gross per 24 hour  Intake 1290 ml  Output 1636 ml  Net -346 ml   Filed Weights   12/02/18 1400 12/03/18 0505 12/03/18 0850  Weight: 123.2 kg 121.3 kg 121.3 kg    Examination:  GENERAL: No acute distress.  Appears well.  HEENT: MMM.  Vision and hearing grossly intact.  NECK: Supple.  No apparent JVD.  RESP:  No IWOB. Good air movement bilaterally. CVS:  RRR. Heart sounds normal.  ABD/GI/GU: Bowel sounds present. Soft. Non tender.  Small amount of dark red urine in nephrostomy bag MSK/EXT:  Moves extremities. No apparent deformity or edema.  SKIN: Old abdominal surgical  scar. NEURO: Awake, alert and oriented appropriately.  No apparent focal neuro deficit.  CT renal stone study on 11/25/2018 - moderate left hydroureteronephrosis, extensive left perinephric stranding and trace free fluid, nonobstructing bilateral nephrolithiasis, possible proctitis, multilevel degenerative changes in spines, hips and SI joints  CT abdomen and pelvis w/o contrast on 12/01/2018 -Previously noted multiple nonobstructing calculi in the collecting systems of both kidneys, 4 mm calculus in the proximal third of left ureter and diffuse soft tissue thickening associated with the left ureter with extensive surrounding inflammatory changes, stable cyst and appropriately positioned PCN.  Assessment & Plan: Left flank pain/left moderate hydronephrosis/possible left calyceal rupture/nonobstructing bilateral nephrolithiasis/developing left pyelonephritis/possible renal abscess.  -CT findings as above. 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  ID recommended IV Ancef with HD 11/30>> for 3 more weeks followed by amox for 2 weeks, and outpt follow up with ID in 3 to 4 weeks -Urology reconsulted for intractable left flank pain and repeat CT finding,  -Urology suggested outpatient follow-up to plan for nephrectomy but patient dismissed [facility] -Transfer to [facility] and [facility] declined by respective urologist as above. -Palliative care consulted for pain management but recommended consulting Dr. Clydell Hakim -Talked to Dr. Maryjean Ka from [place] [facility] who will see patient in 12/04/2018. -Senokot and MiraLAX twice daily as needed for constipation -Continue current dose of Dilaudid and opiates-okay from nephrology standpoint.  Anemia of chronic disease: has dark red output in nephrostomy bag.  H&H stable. -Holding Plavix and aspirin -Monitor  H&H  ESRD on HD MWF Bone mineral disorder Electrolyte abnormalities -Nephrology following.   Essential hypertension/hypotension: Normotensive -Continue midodrine  Anxiety/insomnia/chronic pain syndrome -Continue home Ativan, trazodone and Ambien -Pain control as above.  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  History of peripheral arterial disease -Holding home Plavix and aspirin in the setting of hematuria.  Thrombocytopenia: Resolved.  Hematuria: H&H stable. -Continue holding Plavix and aspirin.  Proctitis: Noted on CT renal stone study on 11/26/2018.  Ruled out for sigmoidoscopy on 12/03/2018. -Sigmoidoscopy on 12/03/2018 without significant finding.  GI signed off.               DVT prophylaxis: SCD Code Status: Full code Family Communication: Patient and/or RN. Available if any question. Disposition Plan: Remains inpatient due to severe left flank pain which Consultants: Urology, IR and nephrology  Procedures:  11/28/2018-percutaneous nephrostomy tube placement 12/03/2018-sigmoidoscopy without significant finding.   Microbiology summarized: COVID-19 screen negative MRSA PCR negative Nephrostomy drain culture beta-lactamase negative Fusobacterium nucleatum.  Sch Meds:  Scheduled Meds:  ALPRAZolam  0.5 mg Oral Once   calcium acetate  667 mg Oral TID WC   [START ON 12/04/2018] Chlorhexidine Gluconate Cloth  6 each Topical Q0600   famotidine  20 mg Oral Q1400   feeding supplement (NEPRO CARB STEADY)  237 mL Oral BID BM   levothyroxine  125 mcg Oral QAC breakfast   LORazepam  1 mg Oral Daily   polyethylene glycol  17 g Oral BID   sodium chloride flush  5 mL Intracatheter Q8H   sodium polystyrene  15 g Oral Once per day on Sun Sat   traZODone  100  mg Oral QHS   Continuous Infusions:  [START ON 12/04/2018]   ceFAZolin (ANCEF) IV     cefTRIAXone (ROCEPHIN)  IV 1 g (12/02/18 1740)   PRN Meds:.acetaminophen **OR** acetaminophen, feeding supplement (NEPRO CARB STEADY), fluticasone, heparin, HYDROmorphone (DILAUDID) injection, ondansetron (ZOFRAN) IV, [DISCONTINUED] oxyCODONE-acetaminophen **AND** oxyCODONE, oxyCODONE-acetaminophen, zolpidem  Antimicrobials: Anti-infectives (From admission, onward)   Start     Dose/Rate Route Frequency Ordered Stop   12/04/18 1800  ceFAZolin (ANCEF) IVPB 2g/100 mL premix     2 g 200 mL/hr over 30 Minutes Intravenous Every M-W-F (1800) 12/03/18 0830     12/01/18 1600  cefTRIAXone (ROCEPHIN) 2 g in sodium chloride 0.9 % 100 mL IVPB  Status:  Discontinued     2 g 200 mL/hr over 30 Minutes Intravenous Every 24 hours 12/01/18 1510 12/01/18 1511   12/01/18 1600  cefTRIAXone (ROCEPHIN) 1 g in sodium chloride 0.9 % 100 mL IVPB     1 g 200 mL/hr over 30 Minutes Intravenous Every 24 hours 12/01/18 1511 12/03/18 2359   12/01/18 0000  cephALEXin (KEFLEX) 500 MG capsule     500 mg Oral 2 times daily 12/01/18 1021 12/06/18 2359   11/26/18 1700  cefTRIAXone (ROCEPHIN) 1 g in sodium chloride 0.9 % 100 mL IVPB  Status:  Discontinued     1 g 200 mL/hr over 30 Minutes Intravenous Every 24 hours 11/26/18 1543 12/01/18 1436       I have personally reviewed the following labs and images: CBC: Recent Labs  Lab 11/29/18 1210 11/30/18 0453 12/01/18 0501 12/02/18 0639 12/03/18 0428  WBC 11.7* 9.5 13.3* 10.4 10.0  HGB 9.4* 8.7* 9.6* 9.0* 8.6*  HCT 29.4* 27.6* 29.7* 27.7* 27.4*  MCV 94.2 96.8 94.3 95.2 96.1  PLT 161 147* 184 181 173   BMP &GFR Recent Labs  Lab 11/28/18 0031 11/29/18 1210 11/30/18 0453 12/01/18 0501 12/02/18 0639 12/03/18 0428  NA 132* 133* 135 133* 134* 134*  K 3.8 4.3 4.6 4.7 5.3* 5.2*  CL 92* 92* 95* 95* 94* 94*  CO2 22 23 25  21* 23 21*  GLUCOSE 108* 140* 108* 107* 87 95  BUN 63* 93* 65* 86* 103* 82*  CREATININE 11.10* 13.08* 9.39* 10.73*  12.93* 11.11*  CALCIUM 9.1 8.6* 8.4* 9.2 9.4 8.9  MG 1.9 2.1 2.0 2.0 2.1  --   PHOS 2.7 2.1* 2.4* 2.9 4.0 4.6   Estimated Creatinine Clearance: 10.2 mL/min (A) (by C-G formula based on SCr of 11.11 mg/dL (H)). Liver & Pancreas: Recent Labs  Lab 11/27/18 0610  11/29/18 1210 11/30/18 0453 12/01/18 0501 12/02/18 0639 12/03/18 0428  AST 26  --   --   --   --   --   --   ALT 20  --   --   --   --   --   --   ALKPHOS 137*  --   --   --   --   --   --   BILITOT 2.1*  --   --   --   --   --   --   PROT 8.2*  --   --   --   --   --   --   ALBUMIN 2.8*   < > 2.5* 2.4* 2.6* 2.6* 2.6*   < > = values in this interval not displayed.   No results for input(s): LIPASE, AMYLASE in the last 168 hours. No results for input(s): AMMONIA in the last 168 hours. Diabetic: No  results for input(s): HGBA1C in the last 72 hours. No results for input(s): GLUCAP in the last 168 hours. Cardiac Enzymes: No results for input(s): CKTOTAL, CKMB, CKMBINDEX, TROPONINI in the last 168 hours. No results for input(s): PROBNP in the last 8760 hours. Coagulation Profile: Recent Labs  Lab 12/01/18 0501  INR 1.0   Thyroid Function Tests: No results for input(s): TSH, T4TOTAL, FREET4, T3FREE, THYROIDAB in the last 72 hours. Lipid Profile: No results for input(s): CHOL, HDL, LDLCALC, TRIG, CHOLHDL, LDLDIRECT in the last 72 hours. Anemia Panel: No results for input(s): VITAMINB12, FOLATE, FERRITIN, TIBC, IRON, RETICCTPCT in the last 72 hours. Urine analysis:    Component Value Date/Time   COLORURINE RED (A) 07/31/2010 0410   APPEARANCEUR TURBID (A) 07/31/2010 0410   LABSPEC 1.044 (H) 07/31/2010 0410   PHURINE 6.5 07/31/2010 0410   GLUCOSEU 100 (A) 07/31/2010 0410   HGBUR LARGE (A) 07/31/2010 0410   BILIRUBINUR LARGE (A) 07/31/2010 0410   KETONESUR 40 (A) 07/31/2010 0410   PROTEINUR >300 (A) 07/31/2010 0410   UROBILINOGEN 4.0 (H) 07/31/2010 0410   NITRITE POSITIVE (A) 07/31/2010 0410   LEUKOCYTESUR LARGE  (A) 07/31/2010 0410   Sepsis Labs: Invalid input(s): PROCALCITONIN, [place]  Microbiology: Recent Results (from the past 240 hour(s))  SARS CORONAVIRUS 2 (TAT 6-24 HRS) Nasopharyngeal Nasopharyngeal Swab     Status: None   Collection Time: 11/26/18  1:37 PM   Specimen: Nasopharyngeal Swab  Result Value Ref Range Status   SARS Coronavirus 2 NEGATIVE NEGATIVE Final    Comment: (NOTE) SARS-CoV-2 target nucleic acids are NOT DETECTED. The SARS-CoV-2 RNA is generally detectable in upper and lower respiratory specimens during the acute phase of infection. Negative results do not preclude SARS-CoV-2 infection, do not rule out co-infections with other pathogens, and should not be used as the sole basis for treatment or other patient management decisions. Negative results must be combined with clinical observations, patient history, and epidemiological information. The expected result is Negative. Fact Sheet for Patients: SugarRoll.be Fact Sheet for Healthcare Providers: https://www.woods-mathews.com/ This test is not yet approved or cleared by the [place] FDA and  has been authorized for detection and/or diagnosis of SARS-CoV-2 by FDA under an Emergency Use Authorization (EUA). This EUA will remain  in effect (meaning this test can be used) for the duration of the COVID-19 declaration under Section 56 4(b)(1) of the Act, 21 U.S.C. section 360bbb-3(b)(1), unless the authorization is terminated or revoked sooner. Performed at [facility], [place] [address] [place], [place] [address]   MRSA PCR Screening     Status: None   Collection Time: 11/27/18  7:31 AM   Specimen: Nasal Mucosa; Nasopharyngeal  Result Value Ref Range Status   MRSA by PCR NEGATIVE NEGATIVE Final    Comment:        The GeneXpert MRSA Assay (FDA approved for NASAL specimens only), is one component of a comprehensive MRSA colonization surveillance program. It  is not intended to diagnose MRSA infection nor to guide or monitor treatment for MRSA infections. Performed at [facility], [place] [address], [place], [place] [address]   Aerobic/Anaerobic Culture (surgical/deep wound)     Status: None   Collection Time: 11/28/18 10:40 AM   Specimen: Abscess; Urine  Result Value Ref Range Status   Specimen Description ABSCESS LEFT NEPHROSTOMY PLACEMENT  Final   Special Requests Normal  Final   Gram Stain   Final    ABUNDANT WBC PRESENT,BOTH PMN AND MONONUCLEAR NO ORGANISMS  SEEN    Culture   Final    FEW FUSOBACTERIUM NUCLEATUM BETA LACTAMASE NEGATIVE Performed at [facility], [place] [address], [place], [place] [address]    Report Status 12/03/2018 FINAL  Final    Radiology Studies: No results found.  60 minutes with more than 50% spent in reviewing records, counseling patient/family, talking to different consultants and coordinating his care.  [name]  If 7PM-7AM, please contact night-coverage [email or web address] Password [facility] 12/03/2018, 2:47 PM

## 2018-12-03 NOTE — Progress Notes (Signed)
[name], PA approved pt to have peach cobbler. RN has to call down to cafeteria to order it.   Paulla Fore, RN, BSN.

## 2018-12-03 NOTE — Anesthesia Procedure Notes (Signed)
Date/Time: 12/03/2018 9:18 AM Performed by: Trinna Post., CRNA Pre-anesthesia Checklist: Patient identified, Emergency Drugs available, Suction available, Patient being monitored and Timeout performed Patient Re-evaluated:Patient Re-evaluated prior to induction Oxygen Delivery Method: Nasal cannula Preoxygenation: Pre-oxygenation with 100% oxygen Induction Type: IV induction Placement Confirmation: positive ETCO2

## 2018-12-04 DIAGNOSIS — N186 End stage renal disease: Secondary | ICD-10-CM | POA: Diagnosis not present

## 2018-12-04 DIAGNOSIS — R109 Unspecified abdominal pain: Secondary | ICD-10-CM | POA: Diagnosis not present

## 2018-12-04 DIAGNOSIS — N132 Hydronephrosis with renal and ureteral calculous obstruction: Secondary | ICD-10-CM | POA: Diagnosis not present

## 2018-12-04 DIAGNOSIS — N200 Calculus of kidney: Secondary | ICD-10-CM | POA: Diagnosis not present

## 2018-12-04 LAB — CBC
HCT: 28.1 % — ABNORMAL LOW (ref 39.0–52.0)
Hemoglobin: 8.7 g/dL — ABNORMAL LOW (ref 13.0–17.0)
MCH: 30.1 pg (ref 26.0–34.0)
MCHC: 31 g/dL (ref 30.0–36.0)
MCV: 97.2 fL (ref 80.0–100.0)
Platelets: 187 10*3/uL (ref 150–400)
RBC: 2.89 MIL/uL — ABNORMAL LOW (ref 4.22–5.81)
RDW: 15.5 % (ref 11.5–15.5)
WBC: 10.3 10*3/uL (ref 4.0–10.5)
nRBC: 0 % (ref 0.0–0.2)

## 2018-12-04 LAB — RENAL FUNCTION PANEL
Albumin: 2.6 g/dL — ABNORMAL LOW (ref 3.5–5.0)
Albumin: 2.6 g/dL — ABNORMAL LOW (ref 3.5–5.0)
Anion gap: 18 — ABNORMAL HIGH (ref 5–15)
Anion gap: 16 — ABNORMAL HIGH (ref 5–15)
BUN: 97 mg/dL — ABNORMAL HIGH (ref 6–20)
BUN: 70 mg/dL — ABNORMAL HIGH (ref 6–20)
CO2: 22 mmol/L (ref 22–32)
CO2: 21 mmol/L — ABNORMAL LOW (ref 22–32)
Calcium: 9.1 mg/dL (ref 8.9–10.3)
Calcium: 8.6 mg/dL — ABNORMAL LOW (ref 8.9–10.3)
Chloride: 94 mmol/L — ABNORMAL LOW (ref 98–111)
Chloride: 95 mmol/L — ABNORMAL LOW (ref 98–111)
Creatinine, Ser: 12.64 mg/dL — ABNORMAL HIGH (ref 0.61–1.24)
Creatinine, Ser: 10.17 mg/dL — ABNORMAL HIGH (ref 0.61–1.24)
GFR calc Af Amer: 5 mL/min — ABNORMAL LOW (ref >60)
GFR calc Af Amer: 6 mL/min — ABNORMAL LOW (ref >60)
GFR calc non Af Amer: 4 mL/min — ABNORMAL LOW (ref >60)
GFR calc non Af Amer: 5 mL/min — ABNORMAL LOW (ref >60)
Glucose, Bld: 92 mg/dL (ref 70–99)
Glucose, Bld: 137 mg/dL — ABNORMAL HIGH (ref 70–99)
Phosphorus: 6 mg/dL — ABNORMAL HIGH (ref 2.5–4.6)
Phosphorus: 4.7 mg/dL — ABNORMAL HIGH (ref 2.5–4.6)
Potassium: 5.4 mmol/L — ABNORMAL HIGH (ref 3.5–5.1)
Potassium: 5.8 mmol/L — ABNORMAL HIGH (ref 3.5–5.1)
Sodium: 134 mmol/L — ABNORMAL LOW (ref 135–145)
Sodium: 132 mmol/L — ABNORMAL LOW (ref 135–145)

## 2018-12-04 MED ORDER — DEXTROSE 5 % IV SOLN
3.0000 g | INTRAVENOUS | Status: DC
  Administered 2018-12-06: 3 g via INTRAVENOUS
  Filled 2018-12-04 (×4): qty 3000

## 2018-12-04 MED ORDER — ALPRAZOLAM 0.5 MG PO TABS
ORAL_TABLET | ORAL | Status: AC
  Filled 2018-12-04: qty 1

## 2018-12-04 MED ORDER — RENA-VITE PO TABS
1.0000 | ORAL_TABLET | Freq: Every day | ORAL | Status: DC
  Administered 2018-12-04 – 2018-12-10 (×7): 1 via ORAL
  Filled 2018-12-04 (×7): qty 1

## 2018-12-04 MED ORDER — HEPARIN SODIUM (PORCINE) 1000 UNIT/ML IJ SOLN
INTRAMUSCULAR | Status: AC
  Filled 2018-12-04: qty 6

## 2018-12-04 MED ORDER — FENTANYL 12 MCG/HR TD PT72
1.0000 | MEDICATED_PATCH | TRANSDERMAL | Status: DC
  Administered 2018-12-04: 1 via TRANSDERMAL
  Filled 2018-12-04: qty 1

## 2018-12-04 MED ORDER — CEFAZOLIN SODIUM-DEXTROSE 2-4 GM/100ML-% IV SOLN
2.0000 g | INTRAVENOUS | Status: DC
  Administered 2018-12-04 – 2018-12-09 (×2): 2 g via INTRAVENOUS
  Filled 2018-12-04 (×4): qty 100

## 2018-12-04 MED ORDER — ALPRAZOLAM 0.5 MG PO TABS
0.5000 mg | ORAL_TABLET | Freq: Once | ORAL | Status: AC
  Administered 2018-12-04: 0.5 mg via ORAL

## 2018-12-04 NOTE — Plan of Care (Signed)
  Problem: Clinical Measurements: Goal: Ability to maintain clinical measurements within normal limits will improve Outcome: Completed/Met Goal: Respiratory complications will improve Outcome: Completed/Met Goal: Cardiovascular complication will be avoided Outcome: Completed/Met   Problem: Nutrition: Goal: Adequate nutrition will be maintained Outcome: Completed/Met   Problem: Elimination: Goal: Will not experience complications related to urinary retention Outcome: Completed/Met   Problem: Health Behavior/Discharge Planning: Goal: Ability to manage health-related needs will improve Outcome: Completed/Met   Problem: Clinical Measurements: Goal: Complications related to the disease process, condition or treatment will be avoided or minimized Outcome: Completed/Met

## 2018-12-04 NOTE — Progress Notes (Signed)
PROGRESS NOTE  [name] [id number] DOB: 11/19/1965   PCP: [name], MD  Patient is from: Home  DOA: 11/25/2018 LOS: 8  Brief Narrative / Interim history: 53 year old male with history of ESRD on HD MWF, spinal bifid, neurogenic bladder/ileal conduit, seizures, migraine headache, HTN/HoTN, kidney stones, GERD and anxiety presenting with left flank pain on 11/25/2018 and admitted for left moderate hydronephrosis, calyceal rupture, nonobstructing bilateral nephrolithiasis and developing pyelonephritis as noted on CT. Nephrology and urology consulted.   Patient was started on IV ceftriaxone for developing pyelonephritis.  Urology recommended left percutaneous nephrostomy by IR, and outpatient follow-up.  Seen by IR 11/26/2018.  Per IR note, declined percutaneous nephrostomy, asking for nephrectomy. However, he later agreed to nephrostomy tube placement.  IR reconsulted 11/27/2018.  Underwent left nephrostomy tube placement on 11/28/2018. Nephrostomy output minimal but with dark red urine. 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 Patient continues to endorse severe left flank pain.  Urology reconsulted and recommended adjustment of his pain medications, treating his constipation,  continuing appropriate antibiotics and outpatient follow-up to discuss further plan.  Unfortunately, pain was not well controlled with high-dose opiates including IV Dilaudid and Percocet.  CT abdomen and pelvis without contrast obtained on 12/01/2018 and revealed previously noted multiple nonobstructing calculi in the collecting systems of both kidneys, 4 mm calculus in the proximal third of left ureter and diffuse soft tissue thickening associated with the left ureter with extensive surrounding inflammatory changes and appropriately positioned PCN.  Urology reconsulted 12/02/2018 again but dismissed by patient.  Urology suggested transfer to tertiary/academic center for  further care.  ID consulted for higher level/possible renal abscess are recommended IV Ancef with HD for 4 weeks followed by p.o. amoxicillin for 2 weeks, and outpatient follow-up in ID clinic in 3 to 4 weeks.  [facility] consulted for proctitis noted on initial CT. Sigmoidoscopy on 12/03/2018 without significant finding.  Recommended good bowel regimen for constipation and signed off.  12/03/2018-discussed with urologist at [facility], Dr. Alroy Dust who declined transfer stating they have no additional intervention to offer.  Also discussed with Dr. Sheral Flow, at [facility] who declined transfer for the same reason.  Consulted Dr. Maryjean Ka at [facility] neurosurgery/pain management 12/1.   Subjective: No major events overnight of this morning.  Continues to endorse left flank pain.  He thinks the enema for sigmoidoscopy might have made his left flank pain worse.  Frustrated about lack of ending solution to his pain.  Also states that he would appeal to Medicare if discharged although no one has brought up this topic yet.   Objective: Vitals:   12/04/18 1000 12/04/18 1030 12/04/18 1100 12/04/18 1130  BP: (!) 106/37 (!) 100/45 (!) 105/51 (!) 108/45  Pulse: 72 68 65 68  Resp:    16  Temp:    98 F (36.7 C)  TempSrc:    Oral  SpO2:    98%  Weight:    121.7 kg  Height:        Intake/Output Summary (Last 24 hours) at 12/04/2018 1227 Last data filed at 12/04/2018 1130 Gross per 24 hour  Intake 710 ml  Output 2100 ml  Net -1390 ml   Filed Weights   12/04/18 0457 12/04/18 0748 12/04/18 1130  Weight: 122.4 kg 123 kg 121.7 kg    Examination:  GENERAL: flat affect. Appears unhappy HEENT: MMM.  Vision and hearing grossly intact.  NECK: Supple.  No apparent JVD.  RESP:  No IWOB. Good air movement bilaterally. CVS:  RRR. Heart sounds normal.  ABD/GI/GU: Bowel sounds present. Soft.  Nephrostomy bag over RLQ.  Dark red, clearer urine in nephrostomy bag. MSK/EXT:  Moves extremities. No apparent  deformity or edema.  SKIN: Old lower abdominal surgical scar.  NEURO: Awake, alert and oriented appropriately.  No apparent focal neuro deficit. PSYCH: Flat affect.  Looks frustrated  CT renal stone study on 11/25/2018 - moderate left hydroureteronephrosis, extensive left perinephric stranding and trace free fluid, nonobstructing bilateral nephrolithiasis, possible proctitis, multilevel degenerative changes in spines, hips and SI joints  CT abdomen and pelvis w/o contrast on 12/01/2018 -Previously noted multiple nonobstructing calculi in the collecting systems of both kidneys, 4 mm calculus in the proximal third of left ureter and diffuse soft tissue thickening associated with the left ureter with extensive surrounding inflammatory changes, stable cyst and appropriately positioned PCN.  Assessment & Plan: Left flank pain/left moderate hydronephrosis/possible left calyceal rupture/nonobstructing bilateral nephrolithiasis/developing left pyelonephritis/possible renal abscess.  -CT findings as above. 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   -Per ID, IV Ancef 11/30>> to complete 6 weeks of tx. Can switch to Amox after 4 wks.  ID f/u outpt in 3 to 4 weeks -Urology reconsulted for intractable left flank pain and repeat CT finding but dismissed by patient -Urology suggested transfer to tertiary/academic center -Transfer to [facility] and [facility] declined by respective urologist -Palliative care consulted for pain management but recommended consulting Dr. Clydell Hakim -Talked to Dr. Maryjean Ka from [place] neurosurgery/pain clinic who will see patient in 12/04/2018. -Senokot and MiraLAX twice daily as needed for constipation -Continue current dose of Dilaudid and opiates-okay from nephrology standpoint.  Anemia of chronic disease: has small dark red but clearer output in  nephrostomy bag.  H&H stable. -Holding Plavix and aspirin -Monitor H&H  ESRD on HD MWF Bone mineral disorder Electrolyte abnormalities -Nephrology following.   Essential hypertension/hypotension: Normotensive -Continue midodrine  Anxiety/insomnia/chronic pain syndrome -Continue home Ativan, trazodone and Ambien -Pain control as above.  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  History of peripheral arterial disease -Holding home Plavix and aspirin in the setting of hematuria.  Thrombocytopenia: Resolved.  Hematuria: H&H stable. -Continue holding Plavix and aspirin.  Proctitis: Noted on CT renal stone study on 11/26/2018.  Ruled out for sigmoidoscopy on 12/03/2018. -Sigmoidoscopy on 12/03/2018 without significant finding.  GI signed off.               DVT prophylaxis: SCD Code Status: Full code Family Communication: Patient and/or RN. Available if any question. Disposition Plan: Remains inpatient due to severe left flank pain which Consultants: Urology, IR and nephrology  Procedures:  11/28/2018-percutaneous nephrostomy tube placement 12/03/2018-sigmoidoscopy without significant finding.   Microbiology summarized: COVID-19 screen negative MRSA PCR negative Nephrostomy drain culture beta-lactamase negative Fusobacterium nucleatum.  Sch Meds:  Scheduled Meds:  ALPRAZolam       ALPRAZolam  0.5 mg Oral Once   calcium acetate  667 mg Oral TID WC   Chlorhexidine Gluconate Cloth  6 each Topical Q0600   famotidine  20 mg Oral Q1400   feeding supplement (NEPRO CARB STEADY)  237 mL Oral BID BM   heparin       levothyroxine  125 mcg Oral QAC breakfast   LORazepam  1 mg Oral Daily   polyethylene glycol  17 g  Oral BID   sodium chloride flush  5 mL Intracatheter Q8H   sodium polystyrene  15 g Oral Once per day on Sun Sat    traZODone  100 mg Oral QHS   Continuous Infusions:  [START ON 12/06/2018]  ceFAZolin (ANCEF) IV      ceFAZolin (ANCEF) IV     PRN Meds:.acetaminophen **OR** acetaminophen, feeding supplement (NEPRO CARB STEADY), fluticasone, heparin, HYDROmorphone (DILAUDID) injection, ondansetron (ZOFRAN) IV, [DISCONTINUED] oxyCODONE-acetaminophen **AND** oxyCODONE, oxyCODONE-acetaminophen, zolpidem  Antimicrobials: Anti-infectives (From admission, onward)   Start     Dose/Rate Route Frequency Ordered Stop   12/06/18 1800  ceFAZolin (ANCEF) 3 g in dextrose 5 % 50 mL IVPB     3 g 100 mL/hr over 30 Minutes Intravenous Every Fri (1800) 12/04/18 1120 01/10/19 1759   12/04/18 1800  ceFAZolin (ANCEF) IVPB 2g/100 mL premix  Status:  Discontinued     2 g 200 mL/hr over 30 Minutes Intravenous Every M-W-F (1800) 12/03/18 0830 12/04/18 1120   12/04/18 1800  ceFAZolin (ANCEF) IVPB 2g/100 mL premix     2 g 200 mL/hr over 30 Minutes Intravenous Once per day on Mon Wed 12/04/18 1120 01/08/19 1759   12/01/18 1600  cefTRIAXone (ROCEPHIN) 2 g in sodium chloride 0.9 % 100 mL IVPB  Status:  Discontinued     2 g 200 mL/hr over 30 Minutes Intravenous Every 24 hours 12/01/18 1510 12/01/18 1511   12/01/18 1600  cefTRIAXone (ROCEPHIN) 1 g in sodium chloride 0.9 % 100 mL IVPB     1 g 200 mL/hr over 30 Minutes Intravenous Every 24 hours 12/01/18 1511 12/03/18 1652   12/01/18 0000  cephALEXin (KEFLEX) 500 MG capsule     500 mg Oral 2 times daily 12/01/18 1021 12/06/18 2359   11/26/18 1700  cefTRIAXone (ROCEPHIN) 1 g in sodium chloride 0.9 % 100 mL IVPB  Status:  Discontinued     1 g 200 mL/hr over 30 Minutes Intravenous Every 24 hours 11/26/18 1543 12/01/18 1436       I have personally reviewed the following labs and images: CBC: Recent Labs  Lab 11/30/18 0453 12/01/18 0501 12/02/18 0639 12/03/18 0428 12/04/18 0714  WBC 9.5 13.3* 10.4 10.0 10.3  HGB 8.7* 9.6* 9.0* 8.6* 8.7*  HCT 27.6* 29.7* 27.7* 27.4* 28.1*    MCV 96.8 94.3 95.2 96.1 97.2  PLT 147* 184 181 173 187   BMP &GFR Recent Labs  Lab 11/28/18 0031 11/29/18 1210 11/30/18 0453 12/01/18 0501 12/02/18 0639 12/03/18 0428 12/04/18 0714  NA 132* 133* 135 133* 134* 134* 134*  K 3.8 4.3 4.6 4.7 5.3* 5.2* 5.4*  CL 92* 92* 95* 95* 94* 94* 94*  CO2 22 23 25  21* 23 21* 22  GLUCOSE 108* 140* 108* 107* 87 95 92  BUN 63* 93* 65* 86* 103* 82* 97*  CREATININE 11.10* 13.08* 9.39* 10.73* 12.93* 11.11* 12.64*  CALCIUM 9.1 8.6* 8.4* 9.2 9.4 8.9 9.1  MG 1.9 2.1 2.0 2.0 2.1  --   --   PHOS 2.7 2.1* 2.4* 2.9 4.0 4.6 6.0*   Estimated Creatinine Clearance: 9 mL/min (A) (by C-G formula based on SCr of 12.64 mg/dL (H)). Liver & Pancreas: Recent Labs  Lab 11/30/18 0453 12/01/18 0501 12/02/18 0639 12/03/18 0428 12/04/18 0714  ALBUMIN 2.4* 2.6* 2.6* 2.6* 2.6*   No results for input(s): LIPASE, AMYLASE in the last 168 hours. No results for input(s): AMMONIA in the last 168 hours. Diabetic: No results for input(s): HGBA1C in the last 72  hours. No results for input(s): GLUCAP in the last 168 hours. Cardiac Enzymes: No results for input(s): CKTOTAL, CKMB, CKMBINDEX, TROPONINI in the last 168 hours. No results for input(s): PROBNP in the last 8760 hours. Coagulation Profile: Recent Labs  Lab 12/01/18 0501  INR 1.0   Thyroid Function Tests: No results for input(s): TSH, T4TOTAL, FREET4, T3FREE, THYROIDAB in the last 72 hours. Lipid Profile: No results for input(s): CHOL, HDL, LDLCALC, TRIG, CHOLHDL, LDLDIRECT in the last 72 hours. Anemia Panel: No results for input(s): VITAMINB12, FOLATE, FERRITIN, TIBC, IRON, RETICCTPCT in the last 72 hours. Urine analysis:    Component Value Date/Time   COLORURINE RED (A) 07/31/2010 0410   APPEARANCEUR TURBID (A) 07/31/2010 0410   LABSPEC 1.044 (H) 07/31/2010 0410   PHURINE 6.5 07/31/2010 0410   GLUCOSEU 100 (A) 07/31/2010 0410   HGBUR LARGE (A) 07/31/2010 0410   BILIRUBINUR LARGE (A) 07/31/2010 0410    KETONESUR 40 (A) 07/31/2010 0410   PROTEINUR >300 (A) 07/31/2010 0410   UROBILINOGEN 4.0 (H) 07/31/2010 0410   NITRITE POSITIVE (A) 07/31/2010 0410   LEUKOCYTESUR LARGE (A) 07/31/2010 0410   Sepsis Labs: Invalid input(s): PROCALCITONIN, [place]  Microbiology: Recent Results (from the past 240 hour(s))  SARS CORONAVIRUS 2 (TAT 6-24 HRS) Nasopharyngeal Nasopharyngeal Swab     Status: None   Collection Time: 11/26/18  1:37 PM   Specimen: Nasopharyngeal Swab  Result Value Ref Range Status   SARS Coronavirus 2 NEGATIVE NEGATIVE Final    Comment: (NOTE) SARS-CoV-2 target nucleic acids are NOT DETECTED. The SARS-CoV-2 RNA is generally detectable in upper and lower respiratory specimens during the acute phase of infection. Negative results do not preclude SARS-CoV-2 infection, do not rule out co-infections with other pathogens, and should not be used as the sole basis for treatment or other patient management decisions. Negative results must be combined with clinical observations, patient history, and epidemiological information. The expected result is Negative. Fact Sheet for Patients: SugarRoll.be Fact Sheet for Healthcare Providers: https://www.woods-mathews.com/ This test is not yet approved or cleared by the [place] FDA and  has been authorized for detection and/or diagnosis of SARS-CoV-2 by FDA under an Emergency Use Authorization (EUA). This EUA will remain  in effect (meaning this test can be used) for the duration of the COVID-19 declaration under Section 56 4(b)(1) of the Act, 21 U.S.C. section 360bbb-3(b)(1), unless the authorization is terminated or revoked sooner. Performed at [facility], [place] [address] [place], [place] [address]   MRSA PCR Screening     Status: None   Collection Time: 11/27/18  7:31 AM   Specimen: Nasal Mucosa; Nasopharyngeal  Result Value Ref Range Status   MRSA by PCR NEGATIVE NEGATIVE  Final    Comment:        The GeneXpert MRSA Assay (FDA approved for NASAL specimens only), is one component of a comprehensive MRSA colonization surveillance program. It is not intended to diagnose MRSA infection nor to guide or monitor treatment for MRSA infections. Performed at [facility], [place] [address] [place], [place] [address]   Aerobic/Anaerobic Culture (surgical/deep wound)     Status: None   Collection Time: 11/28/18 10:40 AM   Specimen: Abscess; Urine  Result Value Ref Range Status   Specimen Description ABSCESS LEFT NEPHROSTOMY PLACEMENT  Final   Special Requests Normal  Final   Gram Stain   Final    ABUNDANT WBC PRESENT,BOTH PMN AND MONONUCLEAR NO ORGANISMS SEEN    Culture   Final  FEW FUSOBACTERIUM NUCLEATUM BETA LACTAMASE NEGATIVE Performed at [facility], [place] [address], [place],  [address]    Report Status 12/03/2018 FINAL  Final    Radiology Studies: No results found.   Prue Lingenfelter T. [place]  If 7PM-7AM, please contact night-coverage [email or web address] Password [facility] 12/04/2018, 12:27 PM

## 2018-12-04 NOTE — Consult Note (Addendum)
Reason for Consult:pain  control Referring Physician: Dr. Lalla Brothers Gebremariam is an 53 y.o. male.  HPI: 53 year old male with past medical history of spina bifida with neurogenic bladder, s/p urinary diversion in the late 90's, also ESRD since 1990's on HD with what appears to be nearly exhausted vascular access options, seizures, migraine headache, HTN/HoTN, kidney stones, GERD and anxiety, who presented with a 2 week history of worsening left flank pain on 11/25/2018 and was admitted for left moderate hydronephrosis, calyceal rupture, nonobstructing bilateral nephrolithiasis and developing pyelonephritis as noted on CT.  In the course of his stay, urology was consulted and recommended left percutaneous nephrostomy by IR, and outpatient follow-up.  Seen by IR 11/26/2018.  Per IR note, declined percutaneous nephrostomy, asking for nephrectomy. After some discussion, the patient consented to PCN and was placed uneventfully on 11/25. Nephrostomy tube continues to drain urine by report.  Despite urinary decompression, and antibiotics, the patient still continues to report left flank and radiating pain into the groin.  Consideration was given to nephrectomy, but the patient a)  Has refused  Further treatment from our local urologists; b)  Requested transfer to a tertiary care facility, but both [facility] apparently have turned down transfer reporting "nothing additional to offer".  Nephrology has been consulted as well, and Mr. Lessard continues HD on schedule without apparent complication.  Gastroenterology was consulted regarding CT findings suggestive of proctitis.  Mr. Molle reports undergoing an enema in preparation for sigmoidoscopy, which he aggravated his current pain situation.  On presentation today, the patient reports that when he started having left-sided abdominal and flank pain some 3 weeks ago, the location, quality, or character of his pain symptoms were very similar to his  usual kidney stone related pain.  He waited for his symptoms to run their usual course, but things only got worse, thus resulting in his admission to the hospital.  He did not acknowledge any distinct worsening of his pain related to his PCN placement, though he does note that it is uncomfortable.  He reports generally feeling like symptoms were gradually improving up until he got his enema in preparation for his procedure yesterday.  Since then, he reports that his pain is significantly worsened.  There is no change in the location, quality, or character of his pain symptoms, only aggravation  In the course of reviewing medication usage, he reports that his p.o. medicines only seem to be lasting about an hour and 1/2 to 2 hours.  He rates his average pain somewhere around a 10, with little improvement.  He reports that increasing doses of IV Dilaudid were ultimately beneficial, though the initial doses were too small to make much difference for him.  He notes that his pain is been relatively unrelenting, has caused him to lose sleep, and only when the Dilaudid doses were increased was he starting to get some hours of reasonable pain control, though still not back to his usual baseline--which he describes on the pain scale at about 5-1/2 out of 10 on a daily basis prior to his hospital admission.   Past Medical History:  Diagnosis Date  . Anemia   . Anxiety   . Constipation   . End stage renal failure on dialysis [facility])    [place]; MWF, on HD since 1997, exhausted upper ext access, and possibly LE access; cath dependent in R groin as of 06/2018  . GERD (gastroesophageal reflux disease)   . Grand mal seizure ([place]) X  4   last 1998 (02/29/2016)  . Heart murmur    no problems with per pt. - nephrologist and Dr. Coletta Memos  . History of blood transfusion 2000s   "I was having blood loss; never found out from where"  . History of kidney stones   . Hypertension    hx of - has not taken bp meds in over 2  years  . Hypothyroidism   . Insomnia   . Migraine    "due to my BP in my late 1990s; none since" (02/29/2016)  . Nonhealing surgical wound    of the left arteriovenous graft  . Pneumonia    x 2  . Shortness of breath    rarely  . Spina bifida [facility])     Past Surgical History:  Procedure Laterality Date  . AMPUTATION Left 12/11/2017   Procedure: Left Great Toe Amputation;  Surgeon: [name], MD;  Location: [place];  Service: Orthopedics;  Laterality: Left;  . APPENDECTOMY    . ARTERIOVENOUS GRAFT PLACEMENT Left 10/16/2012   left femoral goretex graft         Dr Donnetta Hutching  . AV FISTULA PLACEMENT Left 06/27/2012   Procedure: EXPLORATORY LEFT THY-GRAFT PSEUDO-ANEURYSM;  Surgeon: Conrad Argyle, MD;  Location: [place];  Service: Vascular;  Laterality: Left;  Revision of left Arteriovenus gortex graft in thigh.  . COLONOSCOPY    . FLEXIBLE SIGMOIDOSCOPY N/A 12/03/2018   Procedure: FLEXIBLE SIGMOIDOSCOPY;  Surgeon: Yetta Flock, MD;  Location: [place];  Service: Gastroenterology;  Laterality: N/A;  . I&D EXTREMITY Left 02/29/2016   Procedure: DEBRIDEMENT LEFT THIGH WOUND;  Surgeon: Waynetta Sandy, MD;  Location: [place];  Service: Vascular;  Laterality: Left;  . ILEOSTOMY  1970s  . INCISION AND DRAINAGE Left 10/16/2012   Procedure: INCISION AND Debridement left thigh graft;  Surgeon: Rosetta Posner, MD;  Location: [place];  Service: Vascular;  Laterality: Left;  . INSERTION OF DIALYSIS CATHETER    . INSERTION OF DIALYSIS CATHETER  01/14/2016   Procedure: INSERTION OF DIALYSIS CATHETER;  Surgeon: Waynetta Sandy, MD;  Location: [place];  Service: Vascular;;  . INSERTION OF DIALYSIS CATHETER Right 08/07/2017   Procedure: INSERTION OF DIALYSIS CATHETER;  Surgeon: Waynetta Sandy, MD;  Location: [place];  Service: Vascular;  Laterality: Right;  . INSERTION OF DIALYSIS CATHETER Right 12/21/2017   Procedure: RIGHT FEMORAL DIALYSIS CATHETER EXCHANGE;  Surgeon: Marty Heck, MD;  Location: [place];  Service: Vascular;  Laterality: Right;  . INSERTION OF ILIAC STENT Left 01/14/2016   Procedure: INSERTION OF ILIAC STENT;  Surgeon: Waynetta Sandy, MD;  Location: [place];  Service: Vascular;  Laterality: Left;  . INTRAOPERATIVE ARTERIOGRAM Left 01/14/2016   Procedure: INTRA OPERATIVE ARTERIOGRAM;  Surgeon: Waynetta Sandy, MD;  Location: [place];  Service: Vascular;  Laterality: Left;  . IR NEPHROSTOMY PLACEMENT LEFT  11/28/2018  . LITHOTRIPSY  x 3   "& a laser treatment" (02/29/2016)  . NEPHROSTOMY Bilateral 1968  . PATELLAR TENDON REPAIR Right 1990s   "big incision"  . PERIPHERAL VASCULAR CATHETERIZATION  01/14/2016   Procedure: A/V SHUNTOGRAM;  Surgeon: Waynetta Sandy, MD;  Location: [place];  Service: Vascular;;  . REVISION OF ARTERIOVENOUS GORETEX GRAFT Left 10/16/2012   Procedure: REVISION OF LEFT FEMORAL LOOP ARTERIOVENOUS GORETEX GRAFT;  Surgeon: Rosetta Posner, MD;  Location: [place];  Service: Vascular;  Laterality: Left;  . REVISION OF ARTERIOVENOUS GORETEX GRAFT Left 02/11/2015  Procedure: EXCISION OF SMALL SEGMENT OF EXPOSED LEFT THIGH NON FUNCTIONING  ARTERIOVENOUS GORETEX GRAFT;  Surgeon: Mal Misty, MD;  Location: [place];  Service: Vascular;  Laterality: Left;  . REVISION OF ARTERIOVENOUS GORETEX GRAFT Left 01/14/2016   Procedure: REVISION OF ARTERIOVENOUS GORETEX GRAFT;  Surgeon: Waynetta Sandy, MD;  Location: [place];  Service: Vascular;  Laterality: Left;  . REVISION OF ARTERIOVENOUS GORETEX GRAFT Left 02/29/2016   thigh/pt report  . REVISION OF ARTERIOVENOUS GORETEX GRAFT Left 02/29/2016   Procedure: POSSIBLE REVISION OF LEFT THIGH ARTERIOVENOUS GORETEX GRAFT;  Surgeon: Waynetta Sandy, MD;  Location: [place];  Service: Vascular;  Laterality: Left;  . TEE WITHOUT CARDIOVERSION N/A 12/14/2017   Procedure: TRANSESOPHAGEAL ECHOCARDIOGRAM (TEE);  Surgeon: Acie Fredrickson, Wonda Cheng, MD;  Location: [place];   Service: Cardiovascular;  Laterality: N/A;  . THROMBECTOMY AND REVISION OF ARTERIOVENTOUS (AV) GORETEX  GRAFT Left 06/12/2018   Procedure: Removal of infected left thigh graft;  Surgeon: Rosetta Posner, MD;  Location: [place];  Service: Vascular;  Laterality: Left;  . THROMBECTOMY W/ EMBOLECTOMY Left 01/14/2016   Procedure: THROMBECTOMY ARTERIOVENOUS GORE-TEX Left thigh GRAFT;  Surgeon: Waynetta Sandy, MD;  Location: [place];  Service: Vascular;  Laterality: Left;  . TONGUE SURGERY  ~ 1990   tongue-tie release   . ULTRASOUND GUIDANCE FOR VASCULAR ACCESS Right 08/07/2017   Procedure: ULTRASOUND GUIDANCE FOR VASCULAR CANNULATION RIGHT FEMORAL VEIN AND LEFT AV FEMORAL GRAFT;  Surgeon: Waynetta Sandy, MD;  Location: [place];  Service: Vascular;  Laterality: Right;  . UPPER EXTREMITY VENOGRAPHY Bilateral 08/09/2017   Procedure: UPPER EXTREMITY VENOGRAPHY;  Surgeon: Serafina Mitchell, MD;  Location: [place] [facility];  Service: Cardiovascular;  Laterality: Bilateral;  . VENOGRAM N/A 09/20/2017   Procedure: RIGHT COMMON FEMORAL ARTERY EXPLORATION.  CANNULATION RIGHT COMMON FEMORAL VEIN. VENOGRAM CENTRAL ULTRA SOUND GUIDED RIGHT FEMORAL VEIN TIMES TWO.;  Surgeon: Waynetta Sandy, MD;  Location: [facility];  Service: Vascular;  Laterality: N/A;  . WOUND DEBRIDEMENT Left 02/29/2016   thigh    Family History  Problem Relation Age of Onset  . Diabetes Mother   . Hypertension Mother   . Heart disease Mother        before age 13  . Diabetes Father   . Heart attack Father        X's 3  . Diabetes Sister   . Bipolar disorder Sister     Social History:  reports that he has never smoked. He has never used smokeless tobacco. He reports current drug use. Drug: Marijuana. He reports that he does not drink alcohol.  Allergies:  Allergies  Allergen Reactions  . Hydrocodone Other (See Comments)    Caused involuntary movement and twitching. CANNOT TAKE DUE TO MUSCLE SPASMS AND MUSCLE TREMORS   . Furadantin [Nitrofurantoin] Other (See Comments)    UNSPECIFIED REACTION   . Mandelamine [Methenamine] Other (See Comments)    UNSPECIFIED REACTION   . Noroxin [Norfloxacin] Other (See Comments)    UNSPECIFIED REACTION   . Carmine Nausea Only  . Contrast Media [Iodinated Diagnostic Agents] Nausea And Vomiting    Oral dye causes vomiting, IV dye is okay  . Metrizamide Nausea And Vomiting    Oral dye causes vomiting, IV dye is okay  . Sulfa Antibiotics Other (See Comments) and Cough    Childhood reaction - pt could not confirm that it was a cough  . Sulfur Cough    Childhood reaction - pt could not  confirm that it was a cough    Medications: I have reviewed the patient's current medications.  Results for orders placed or performed during the hospital encounter of 11/25/18 (from the past 48 hour(s))  CBC-5am     Status: Abnormal   Collection Time: 12/03/18  4:28 AM  Result Value Ref Range   WBC 10.0 4.0 - 10.5 K/uL   RBC 2.85 (L) 4.22 - 5.81 MIL/uL   Hemoglobin 8.6 (L) 13.0 - 17.0 g/dL   HCT 27.4 (L) 39.0 - 52.0 %   MCV 96.1 80.0 - 100.0 fL   MCH 30.2 26.0 - 34.0 pg   MCHC 31.4 30.0 - 36.0 g/dL   RDW 15.0 11.5 - 15.5 %   Platelets 173 150 - 400 K/uL   nRBC 0.0 0.0 - 0.2 %    Comment: Performed at [facility], [address], [place], [place] [address]  Renal p 5 am     Status: Abnormal   Collection Time: 12/03/18  4:28 AM  Result Value Ref Range   Sodium 134 (L) 135 - 145 mmol/L   Potassium 5.2 (H) 3.5 - 5.1 mmol/L   Chloride 94 (L) 98 - 111 mmol/L   CO2 21 (L) 22 - 32 mmol/L   Glucose, Bld 95 70 - 99 mg/dL   BUN 82 (H) 6 - 20 mg/dL   Creatinine, Ser 11.11 (H) 0.61 - 1.24 mg/dL   Calcium 8.9 8.9 - 10.3 mg/dL   Phosphorus 4.6 2.5 - 4.6 mg/dL   Albumin 2.6 (L) 3.5 - 5.0 g/dL   GFR calc non Af Amer 5 (L) >60 mL/min   GFR calc Af Amer 5 (L) >60 mL/min   Anion gap 19 (H) 5 - 15    Comment: Performed at [facility] [address], [place], [place] [address]   Renal p 5 am     Status: Abnormal   Collection Time: 12/04/18  7:14 AM  Result Value Ref Range   Sodium 134 (L) 135 - 145 mmol/L   Potassium 5.4 (H) 3.5 - 5.1 mmol/L   Chloride 94 (L) 98 - 111 mmol/L   CO2 22 22 - 32 mmol/L   Glucose, Bld 92 70 - 99 mg/dL   BUN 97 (H) 6 - 20 mg/dL   Creatinine, Ser 12.64 (H) 0.61 - 1.24 mg/dL   Calcium 9.1 8.9 - 10.3 mg/dL   Phosphorus 6.0 (H) 2.5 - 4.6 mg/dL   Albumin 2.6 (L) 3.5 - 5.0 g/dL   GFR calc non Af Amer 4 (L) >60 mL/min   GFR calc Af Amer 5 (L) >60 mL/min   Anion gap 18 (H) 5 - 15    Comment: Performed at [facility], [place] [address], [place], [place] [address]  CBC-5am     Status: Abnormal   Collection Time: 12/04/18  7:14 AM  Result Value Ref Range   WBC 10.3 4.0 - 10.5 K/uL   RBC 2.89 (L) 4.22 - 5.81 MIL/uL   Hemoglobin 8.7 (L) 13.0 - 17.0 g/dL   HCT 28.1 (L) 39.0 - 52.0 %   MCV 97.2 80.0 - 100.0 fL   MCH 30.1 26.0 - 34.0 pg   MCHC 31.0 30.0 - 36.0 g/dL   RDW 15.5 11.5 - 15.5 %   Platelets 187 150 - 400 K/uL   nRBC 0.0 0.0 - 0.2 %    Comment: Performed at [facility], [place] [address] [place], [place] [address]  No results found.  Review of Systems  Constitutional: Negative.   HENT: Negative.   Eyes: Negative.   Respiratory: Negative.   Cardiovascular: Negative.   Gastrointestinal: Positive for abdominal pain and constipation.  Genitourinary: Positive for flank pain.  Musculoskeletal: Positive for back pain. Negative for falls and myalgias.  Skin: Negative.   Neurological: Negative.   Endo/Heme/Allergies: Negative.   Psychiatric/Behavioral: Negative.    Blood pressure (!) 92/41, pulse 70, temperature 98 F (36.7 C), temperature source Oral, resp. rate 16, height 5\' 11"  (1.803 m), weight 123 kg, SpO2 95 %. Physical Exam  Constitutional: He appears well-developed and well-nourished.  HENT:  Head: Normocephalic and atraumatic.  Eyes: Pupils are equal, round, and reactive to light.  Neck: Normal range  of motion.  Cardiovascular: Normal rate.  Respiratory: Effort normal.  Musculoskeletal: Normal range of motion.  Neurological: He is alert.  Skin: Skin is warm.  Psychiatric: He has a normal mood and affect. Thought content normal.    Assessment/Plan: Kidney stone with resultant pyelonephritis, difficult to control pain. Pt uses percocet 10/325 q6 hr at home.   Given current round-the-clock symptoms, and short duration of tx with PO and IV medicines, would trial sustained release. Start with duragesic 12 mcg patch. Continue PO oxycodone for moderate breakthrough pain, IV for severe only.  Will follow with you.   Bonna Gains 12/04/2018, 12:57 PM

## 2018-12-04 NOTE — Progress Notes (Addendum)
Initial Nutrition Assessment  DOCUMENTATION CODES:   Obesity unspecified  INTERVENTION:  Continue Nepro (wild berry flavor) BID (each supplement provides 425 calories and 19.1 grams of protein)  Provide Rena-Vite Daily at bedtime  NUTRITION DIAGNOSIS:   Inadequate oral intake related to poor appetite as evidenced by meal completion < 50%.  GOAL:   Patient will meet greater than or equal to 90% of their needs  MONITOR:   PO intake, Supplement acceptance, Labs, Weight trends, Skin, I & O's  REASON FOR ASSESSMENT:   Consult Diet education  ASSESSMENT:   53 year old male with history of ESRD on HD MWF, spinal bifid, neurogenic bladder/ileal conduit, seizures, migraine headache, HTN/HoTN, kidney stones, GERD and anxiety presenting with left flank pain on 11/25/2018 and admitted for left moderate hydronephrosis  Pt evaluated by IR on 11/24, recommended for percutaneous nephrostomy, pt. Declined.  GI consulted for proctitis, Pt s/p sigmoidoscopy on 12/1 without significant findings.  Pt states that he knows he is down 2kg from his EDW of 123kg. He states that he has had a poor appetite for about 2 weeks PTA. During this time he stated that he wouldn't eat very much, if anything at all. He states that when his appetite was good, he would typically eat two meals a day, lunch and dinner. Both consist of frozen, ready to heat meals. He reports that he knows they are high in sodium and he has plans to meal prep chicken and rice for meals once he returns home. Pt reports being on dialysis for 25 years and states "I know what I can and cannot have on the renal diet" and that "I know I can have whatever I want, I just have to be careful about portions." To this, he was counseled on being aware of the amount of sodium and potassium he was consuming, and to try and limit these foods.   Pt reports really liking the wild berry flavor of Nepro, and understands the importance of meeting his calorie  and protein needs. Pt does not take supplements at home, but reports to drinking novasource at dialysis.  He states that he lives with his sister at home and she does the grocery shopping, and often doesn't get him what he requests from the store.   Pt is -1.3kg from documented EDW, but is up 2.1kg from admit weight. Pt is otherwise wt. Stable with minimal fluctuations attributable from fluid gains from CKD and losses from HD. Rd will continue to monitor.  Noted pt's phosphorus up to 6.0 today, but binder therapy had been held. Restarted yesterday evening.  Labs reviewed include: potassium 5.4; sodium 134; chloride 94; creat 12.64; calcium 9.1; albumin 2.6 ;phosphorus 6  Medications reviewed include: phoslo, pepcid; synthroid; miralax; heparin  NUTRITION - FOCUSED PHYSICAL EXAM:    Most Recent Value  Orbital Region  Mild depletion  Upper Arm Region  Mild depletion  Thoracic and Lumbar Region  No depletion  Buccal Region  Mild depletion  Temple Region  Mild depletion  Clavicle Bone Region  No depletion  Clavicle and Acromion Bone Region  No depletion  Scapular Bone Region  No depletion  Dorsal Hand  No depletion  Patellar Region  No depletion  Anterior Thigh Region  No depletion  Posterior Calf Region  No depletion  Edema (RD Assessment)  Mild  Hair  Reviewed  Eyes  Reviewed  Mouth  Reviewed  Skin  Reviewed  Nails  Reviewed       Diet Order:  Diet Order            Diet renal with fluid restriction Fluid restriction: 1200 mL Fluid; Room service appropriate? Yes; Fluid consistency: Thin  Diet effective now        Diet - low sodium heart healthy              EDUCATION NEEDS:   Education needs have been addressed  Skin:  Skin Assessment: Reviewed RN Assessment  Last BM:  12/1  Height:   Ht Readings from Last 1 Encounters:  12/03/18 5\' 11"  (1.803 m)    Weight:   Wt Readings from Last 1 Encounters:  12/04/18 121.7 kg    Ideal Body Weight:  78 kg  BMI:   Body mass index is 37.42 kg/m.  Estimated Nutritional Needs:   Kcal:  2100-2300  Protein:  105-120 grams  Fluid:  </= 1.2 L  Meda Klinefelter, Dietetic Intern

## 2018-12-04 NOTE — Plan of Care (Signed)
  Problem: Education: Goal: Individualized Educational Video(s) Outcome: Progressing   

## 2018-12-04 NOTE — Procedures (Signed)
I was present at this dialysis session. I have reviewed the session itself and made appropriate changes.   2K bath.  Tol well in chair.  Not accepted at tertiary facility.  Will need outpt uro f/u.   Filed Weights   12/03/18 0850 12/04/18 0457 12/04/18 0748  Weight: 121.3 kg 122.4 kg 123 kg    Recent Labs  Lab 12/04/18 0714  NA 134*  K 5.4*  CL 94*  CO2 22  GLUCOSE 92  BUN 97*  CREATININE 12.64*  CALCIUM 9.1  PHOS 6.0*    Recent Labs  Lab 12/02/18 0639 12/03/18 0428 12/04/18 0714  WBC 10.4 10.0 10.3  HGB 9.0* 8.6* 8.7*  HCT 27.7* 27.4* 28.1*  MCV 95.2 96.1 97.2  PLT 181 173 187    Scheduled Meds: . ALPRAZolam      . ALPRAZolam  0.5 mg Oral Once  . calcium acetate  667 mg Oral TID WC  . Chlorhexidine Gluconate Cloth  6 each Topical Q0600  . famotidine  20 mg Oral Q1400  . feeding supplement (NEPRO CARB STEADY)  237 mL Oral BID BM  . levothyroxine  125 mcg Oral QAC breakfast  . LORazepam  1 mg Oral Daily  . polyethylene glycol  17 g Oral BID  . sodium chloride flush  5 mL Intracatheter Q8H  . sodium polystyrene  15 g Oral Once per day on Sun Sat  . traZODone  100 mg Oral QHS   Continuous Infusions: .  ceFAZolin (ANCEF) IV     PRN Meds:.acetaminophen **OR** acetaminophen, feeding supplement (NEPRO CARB STEADY), fluticasone, heparin, HYDROmorphone (DILAUDID) injection, ondansetron (ZOFRAN) IV, [DISCONTINUED] oxyCODONE-acetaminophen **AND** oxyCODONE, oxyCODONE-acetaminophen, zolpidem   Pearson Grippe  MD 12/04/2018, 9:34 AM

## 2018-12-05 DIAGNOSIS — N186 End stage renal disease: Secondary | ICD-10-CM | POA: Diagnosis not present

## 2018-12-05 DIAGNOSIS — Z87728 Personal history of other specified (corrected) congenital malformations of nervous system and sense organs: Secondary | ICD-10-CM

## 2018-12-05 DIAGNOSIS — N200 Calculus of kidney: Secondary | ICD-10-CM | POA: Diagnosis not present

## 2018-12-05 DIAGNOSIS — E875 Hyperkalemia: Secondary | ICD-10-CM

## 2018-12-05 DIAGNOSIS — E871 Hypo-osmolality and hyponatremia: Secondary | ICD-10-CM

## 2018-12-05 DIAGNOSIS — R109 Unspecified abdominal pain: Secondary | ICD-10-CM | POA: Diagnosis not present

## 2018-12-05 DIAGNOSIS — N151 Renal and perinephric abscess: Secondary | ICD-10-CM

## 2018-12-05 LAB — CBC
HCT: 28.2 % — ABNORMAL LOW (ref 39.0–52.0)
Hemoglobin: 8.7 g/dL — ABNORMAL LOW (ref 13.0–17.0)
MCH: 30.5 pg (ref 26.0–34.0)
MCHC: 30.9 g/dL (ref 30.0–36.0)
MCV: 98.9 fL (ref 80.0–100.0)
Platelets: 187 10*3/uL (ref 150–400)
RBC: 2.85 MIL/uL — ABNORMAL LOW (ref 4.22–5.81)
RDW: 15.9 % — ABNORMAL HIGH (ref 11.5–15.5)
WBC: 9.4 10*3/uL (ref 4.0–10.5)
nRBC: 0 % (ref 0.0–0.2)

## 2018-12-05 MED ORDER — CHLORHEXIDINE GLUCONATE CLOTH 2 % EX PADS
6.0000 | MEDICATED_PAD | Freq: Every day | CUTANEOUS | Status: DC
  Administered 2018-12-06 – 2018-12-11 (×5): 6 via TOPICAL

## 2018-12-05 MED ORDER — FENTANYL 25 MCG/HR TD PT72
1.0000 | MEDICATED_PATCH | TRANSDERMAL | Status: DC
  Administered 2018-12-05: 1 via TRANSDERMAL
  Filled 2018-12-05: qty 1

## 2018-12-05 NOTE — Progress Notes (Signed)
PROGRESS NOTE  [name] [id number] DOB: 03-03-1965   PCP: [name], MD  Patient is from: Home  DOA: 11/25/2018 LOS: 9  Brief Narrative / Interim history: 53 year old male with history of ESRD on HD MWF, spinal bifid, neurogenic bladder/ileal conduit, seizures, migraine headache, HTN/HoTN, kidney stones, GERD and anxiety presenting with left flank pain on 11/25/2018 and admitted for left moderate hydronephrosis, calyceal rupture, nonobstructing bilateral nephrolithiasis and developing pyelonephritis as noted on CT. Nephrology and urology consulted.   Patient was started on IV ceftriaxone for developing pyelonephritis.  Urology recommended left percutaneous nephrostomy by IR, and outpatient follow-up.  Seen by IR 11/26/2018.  Per IR note, declined percutaneous nephrostomy, asking for nephrectomy. However, he later agreed to nephrostomy tube placement.  IR reconsulted 11/27/2018.  Underwent left nephrostomy tube placement on 11/28/2018. Nephrostomy output minimal but with dark red urine. 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 Patient continues to endorse severe left flank pain.  Urology reconsulted and recommended adjustment of his pain medications, treating his constipation,  continuing appropriate antibiotics and outpatient follow-up to discuss further plan.  Unfortunately, pain was not well controlled with high-dose opiates including IV Dilaudid and Percocet.  CT abdomen and pelvis without contrast obtained on 12/01/2018 and revealed previously noted multiple nonobstructing calculi in the collecting systems of both kidneys, 4 mm calculus in the proximal third of left ureter and diffuse soft tissue thickening associated with the left ureter with extensive surrounding inflammatory changes and appropriately positioned PCN.  Urology reconsulted 12/02/2018 again but dismissed by patient.  Urology suggested transfer to tertiary/academic center for  further care.  ID consulted for higher level/possible renal abscess are recommended IV Ancef with HD for 4 weeks followed by p.o. amoxicillin for 2 weeks, and outpatient follow-up in ID clinic in 3 to 4 weeks.  [facility] consulted for proctitis noted on initial CT. Sigmoidoscopy on 12/03/2018 without significant finding.  Recommended good bowel regimen for constipation and signed off.  12/03/2018-discussed with urologist at [facility], Dr. Alroy Dust who declined transfer stating they have no additional intervention to offer.  Also discussed with Dr. Sheral Flow, at [facility] who declined transfer for the same reason.  Consulted Dr. Maryjean Ka at [facility].   Subjective: No major events overnight of this morning.  Continues to endorse 10/10 pain.  He states he could have allergy to the pain patch.  However, he could not clarify the nature of his allergy to the pain patch.  He also says his pain was better right before the enema for sigmoidoscopy although he didn't report improvement in his pain throughout his hospitalization as far as I remember.  He states he will be able to leave on Saturday but not able to clarify what would  Change by then.  He also reports irritation and pain from nephrostomy tube.  Objective: Vitals:   12/04/18 1708 12/04/18 2103 12/05/18 0328 12/05/18 0953  BP: (!) 104/56 119/65 109/69 (!) 94/50  Pulse: 71 75 80 77  Resp: 18 16 16 18   Temp: 98 F (36.7 C) 98.1 F (36.7 C) 98.9 F (37.2 C) 97.6 F (36.4 C)  TempSrc: Oral Oral Oral   SpO2: 98% 97% 100% 98%  Weight:  122.3 kg    Height:        Intake/Output Summary (Last 24 hours) at 12/05/2018 1005 Last data filed at 12/05/2018 0400 Gross per 24 hour  Intake 860 ml  Output 2040 ml  Net -1180 ml   Filed Weights   12/04/18 0748  12/04/18 1130 12/04/18 2103  Weight: 123 kg 121.7 kg 122.3 kg    Examination:  GENERAL: No acute distress.  Appears well.  HEENT: MMM.  Vision and hearing grossly  intact.  NECK: Supple.  No apparent JVD.  RESP:  No IWOB. Good air movement bilaterally. CVS:  RRR. Heart sounds normal.  ABD/GI/GU: Bowel sounds present. Soft.  Nephrostomy bag over RLQ.  Small dark red but clear urine in nephrostomy MSK/EXT:  Moves extremities. No apparent deformity or edema.  SKIN: Old healed vertical surgical scar over lower abdomen from previous surgery NEURO: Awake, alert and oriented appropriately.  No apparent focal neuro deficit. PSYCH: Flat affect.  Looks unhappy   CT renal stone study on 11/25/2018 - moderate left hydroureteronephrosis, extensive left perinephric stranding and trace free fluid, nonobstructing bilateral nephrolithiasis, possible proctitis, multilevel degenerative changes in spines, hips and SI joints  CT abdomen and pelvis w/o contrast on 12/01/2018 -Previously noted multiple nonobstructing calculi in the collecting systems of both kidneys, 4 mm calculus in the proximal third of left ureter and diffuse soft tissue thickening associated with the left ureter with extensive surrounding inflammatory changes, stable cyst and appropriately positioned PCN.  Assessment & Plan: Left flank pain/left moderate hydronephrosis/possible left calyceal rupture/nonobstructing bilateral nephrolithiasis/developing left pyelonephritis/possible renal abscess.  -CT findings as above. 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   -Per ID, IV Ancef 11/30>> to complete 6 weeks of tx. Can switch to Amox after 4 wks.  ID f/u outpt in 3 to 4 weeks -Urology reconsulted for intractable left flank pain and repeat CT finding but dismissed by patient -Urology suggested transfer to tertiary/academic center -Transfer to [facility] and [facility] declined by respective urologist -Palliative care consulted for pain management but recommended consulting Dr. Clydell Hakim -Appreciate help by Dr. Maryjean Ka from [place] neurosurgery/pain clinic -following. -Senokot and MiraLAX twice daily as needed for constipation  Anemia of chronic disease: has small dark red but clearer output in nephrostomy bag.  H&H stable. -Resume aspirin.  Hold Plavix for now -Monitor H&H  ESRD on HD MWF Bone mineral disorder Electrolyte abnormalities-hyponatremia/hyperkalemia/hyperphosphatemia Anion gap metabolic acidosis/azotemia -Nephrology following.   Essential hypertension/hypotension: Normotensive -Continue midodrine  Anxiety/insomnia/chronic pain syndrome -Continue home Ativan, trazodone and Ambien -Pain control as above.  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  History of peripheral arterial disease -Holding home Plavix and aspirin in the setting of hematuria.  Thrombocytopenia: Resolved.  Hematuria: H&H stable. -Continue holding Plavix and aspirin.  Proctitis: Noted on CT renal stone study on 11/26/2018.  Ruled out for sigmoidoscopy on 12/03/2018. -Sigmoidoscopy on 12/03/2018 without significant finding.  GI signed off.     Nutrition Problem: Inadequate oral intake Etiology: poor appetite  Signs/Symptoms: meal completion < 50%  Interventions: Nepro shake, MVI   DVT prophylaxis: SCD Code Status: Full code Family Communication: Patient and/or RN. Available if any question. Disposition Plan: Remains inpatient due to severe left flank pain which Consultants: Urology (off), IR (off), nephrology, neurosurgery/pain  Procedures:  11/28/2018-percutaneous nephrostomy tube placement 12/03/2018-sigmoidoscopy without significant finding.   Microbiology summarized: COVID-19 screen negative MRSA PCR negative Nephrostomy drain culture beta-lactamase negative Fusobacterium nucleatum.  Sch Meds:  Scheduled Meds:   ALPRAZolam  0.5 mg Oral Once   calcium acetate  667 mg Oral TID WC   Chlorhexidine Gluconate  Cloth  6 each Topical Q0600   famotidine  20 mg Oral Q1400   feeding supplement (NEPRO CARB STEADY)  237 mL Oral BID BM   fentaNYL  1 patch Transdermal Q72H   levothyroxine  125 mcg Oral QAC breakfast   LORazepam  1 mg Oral Daily   multivitamin  1 tablet Oral QHS   polyethylene glycol  17 g Oral BID   sodium chloride flush  5 mL Intracatheter Q8H   sodium polystyrene  15 g Oral Once per day on Sun Sat   traZODone  100 mg Oral QHS   Continuous Infusions:  [START ON 12/06/2018]  ceFAZolin (ANCEF) IV      ceFAZolin (ANCEF) IV 2 g (12/04/18 1801)   PRN Meds:.acetaminophen **OR** acetaminophen, feeding supplement (NEPRO CARB STEADY), fluticasone, heparin, HYDROmorphone (DILAUDID) injection, ondansetron (ZOFRAN) IV, [DISCONTINUED] oxyCODONE-acetaminophen **AND** oxyCODONE, oxyCODONE-acetaminophen, zolpidem  Antimicrobials: Anti-infectives (From admission, onward)   Start     Dose/Rate Route Frequency Ordered Stop   12/06/18 1800  ceFAZolin (ANCEF) 3 g in dextrose 5 % 50 mL IVPB     3 g 100 mL/hr over 30 Minutes Intravenous Every Fri (1800) 12/04/18 1120 01/10/19 1759   12/04/18 1800  ceFAZolin (ANCEF) IVPB 2g/100 mL premix  Status:  Discontinued     2 g 200 mL/hr over 30 Minutes Intravenous Every M-W-F (1800) 12/03/18 0830 12/04/18 1120   12/04/18 1800  ceFAZolin (ANCEF) IVPB 2g/100 mL premix     2 g 200 mL/hr over 30 Minutes Intravenous Once per day on Mon Wed 12/04/18 1120 01/08/19 1759   12/01/18 1600  cefTRIAXone (ROCEPHIN) 2 g in sodium chloride 0.9 % 100 mL IVPB  Status:  Discontinued     2 g 200 mL/hr over 30 Minutes Intravenous Every 24 hours 12/01/18 1510 12/01/18 1511   12/01/18 1600  cefTRIAXone (ROCEPHIN) 1 g in sodium chloride 0.9 % 100 mL IVPB     1 g 200 mL/hr over 30 Minutes Intravenous Every 24 hours 12/01/18 1511 12/03/18 1652   12/01/18 0000  cephALEXin (KEFLEX)  500 MG capsule     500 mg Oral 2 times daily 12/01/18 1021 12/06/18 2359   11/26/18 1700  cefTRIAXone (ROCEPHIN) 1 g in sodium chloride 0.9 % 100 mL IVPB  Status:  Discontinued     1 g 200 mL/hr over 30 Minutes Intravenous Every 24 hours 11/26/18 1543 12/01/18 1436       I have personally reviewed the following labs and images: CBC: Recent Labs  Lab 12/01/18 0501 12/02/18 0639 12/03/18 0428 12/04/18 0714 12/05/18 0340  WBC 13.3* 10.4 10.0 10.3 9.4  HGB 9.6* 9.0* 8.6* 8.7* 8.7*  HCT 29.7* 27.7* 27.4* 28.1* 28.2*  MCV 94.3 95.2 96.1 97.2 98.9  PLT 184 181 173 187 187   BMP &GFR Recent Labs  Lab 11/29/18 1210 11/30/18 0453 12/01/18 0501 12/02/18 0639 12/03/18 0428 12/04/18 0714 12/04/18 1827  NA 133* 135 133* 134* 134* 134* 132*  K 4.3 4.6 4.7 5.3* 5.2* 5.4* 5.8*  CL 92* 95* 95* 94* 94* 94* 95*  CO2 23 25 21* 23 21* 22 21*  GLUCOSE 140* 108* 107* 87 95 92 137*  BUN 93* 65* 86* 103* 82* 97* 70*  CREATININE 13.08* 9.39* 10.73* 12.93* 11.11* 12.64* 10.17*  CALCIUM 8.6* 8.4* 9.2 9.4 8.9 9.1 8.6*  MG 2.1 2.0 2.0 2.1  --   --   --   PHOS 2.1* 2.4* 2.9 4.0 4.6 6.0* 4.7*   Estimated Creatinine Clearance: 11.2  mL/min (A) (by C-G formula based on SCr of 10.17 mg/dL (H)). Liver & Pancreas: Recent Labs  Lab 12/01/18 0501 12/02/18 0639 12/03/18 0428 12/04/18 0714 12/04/18 1827  ALBUMIN 2.6* 2.6* 2.6* 2.6* 2.6*   No results for input(s): LIPASE, AMYLASE in the last 168 hours. No results for input(s): AMMONIA in the last 168 hours. Diabetic: No results for input(s): HGBA1C in the last 72 hours. No results for input(s): GLUCAP in the last 168 hours. Cardiac Enzymes: No results for input(s): CKTOTAL, CKMB, CKMBINDEX, TROPONINI in the last 168 hours. No results for input(s): PROBNP in the last 8760 hours. Coagulation Profile: Recent Labs  Lab 12/01/18 0501  INR 1.0   Thyroid Function Tests: No results for input(s): TSH, T4TOTAL, FREET4, T3FREE, THYROIDAB in the last  72 hours. Lipid Profile: No results for input(s): CHOL, HDL, LDLCALC, TRIG, CHOLHDL, LDLDIRECT in the last 72 hours. Anemia Panel: No results for input(s): VITAMINB12, FOLATE, FERRITIN, TIBC, IRON, RETICCTPCT in the last 72 hours. Urine analysis:    Component Value Date/Time   COLORURINE RED (A) 07/31/2010 0410   APPEARANCEUR TURBID (A) 07/31/2010 0410   LABSPEC 1.044 (H) 07/31/2010 0410   PHURINE 6.5 07/31/2010 0410   GLUCOSEU 100 (A) 07/31/2010 0410   HGBUR LARGE (A) 07/31/2010 0410   BILIRUBINUR LARGE (A) 07/31/2010 0410   KETONESUR 40 (A) 07/31/2010 0410   PROTEINUR >300 (A) 07/31/2010 0410   UROBILINOGEN 4.0 (H) 07/31/2010 0410   NITRITE POSITIVE (A) 07/31/2010 0410   LEUKOCYTESUR LARGE (A) 07/31/2010 0410   Sepsis Labs: Invalid input(s): PROCALCITONIN, [place]  Microbiology: Recent Results (from the past 240 hour(s))  SARS CORONAVIRUS 2 (TAT 6-24 HRS) Nasopharyngeal Nasopharyngeal Swab     Status: None   Collection Time: 11/26/18  1:37 PM   Specimen: Nasopharyngeal Swab  Result Value Ref Range Status   SARS Coronavirus 2 NEGATIVE NEGATIVE Final    Comment: (NOTE) SARS-CoV-2 target nucleic acids are NOT DETECTED. The SARS-CoV-2 RNA is generally detectable in upper and lower respiratory specimens during the acute phase of infection. Negative results do not preclude SARS-CoV-2 infection, do not rule out co-infections with other pathogens, and should not be used as the sole basis for treatment or other patient management decisions. Negative results must be combined with clinical observations, patient history, and epidemiological information. The expected result is Negative. Fact Sheet for Patients: SugarRoll.be Fact Sheet for Healthcare Providers: https://www.woods-mathews.com/ This test is not yet approved or cleared by the [place] FDA and  has been authorized for detection and/or diagnosis of SARS-CoV-2 by FDA under  an Emergency Use Authorization (EUA). This EUA will remain  in effect (meaning this test can be used) for the duration of the COVID-19 declaration under Section 56 4(b)(1) of the Act, 21 U.S.C. section 360bbb-3(b)(1), unless the authorization is terminated or revoked sooner. Performed at [facility], [place] [address], [place], [place] [address]   MRSA PCR Screening     Status: None   Collection Time: 11/27/18  7:31 AM   Specimen: Nasal Mucosa; Nasopharyngeal  Result Value Ref Range Status   MRSA by PCR NEGATIVE NEGATIVE Final    Comment:        The GeneXpert MRSA Assay (FDA approved for NASAL specimens only), is one component of a comprehensive MRSA colonization surveillance program. It is not intended to diagnose MRSA infection nor to guide or monitor treatment for MRSA infections. Performed at [facility], [place] [address], [place], [place] [address]   Aerobic/Anaerobic Culture (  surgical/deep wound)     Status: None   Collection Time: 11/28/18 10:40 AM   Specimen: Abscess; Urine  Result Value Ref Range Status   Specimen Description ABSCESS LEFT NEPHROSTOMY PLACEMENT  Final   Special Requests Normal  Final   Gram Stain   Final    ABUNDANT WBC PRESENT,BOTH PMN AND MONONUCLEAR NO ORGANISMS SEEN    Culture   Final    FEW FUSOBACTERIUM NUCLEATUM BETA LACTAMASE NEGATIVE Performed at [facility], [address], [place], [place] [address]    Report Status 12/03/2018 FINAL  Final    Radiology Studies: No results found.   Catilyn Boggus T. [place]  If 7PM-7AM, please contact night-coverage [email or web address] Password [email or web address] 12/05/2018, 10:05 AM

## 2018-12-05 NOTE — Progress Notes (Addendum)
Subjective: Patient reports pain in same distribution, no identified improvement in control with 12 mcg duragesic patch  Objective: Vital signs in last 24 hours: Temp:  [97.6 F (36.4 C)-98.9 F (37.2 C)] 97.6 F (36.4 C) (12/03 0953) Pulse Rate:  [71-80] 77 (12/03 0953) Resp:  [16-18] 18 (12/03 0953) BP: (94-119)/(50-69) 94/50 (12/03 0953) SpO2:  [97 %-100 %] 98 % (12/03 0953) Weight:  [122.3 kg] 122.3 kg (12/02 2103)  Intake/Output from previous day: 12/02 0701 - 12/03 0700 In: 860 [P.O.:760; IV Piggyback:100] Out: 2040 [Urine:40] Intake/Output this shift: No intake/output data recorded.  sitting in chair, MAE w/o difficulty; sleepy when I entered room but easily aroused, conversant A/O x3  Lab Results: Recent Labs    12/04/18 0714 12/05/18 0340  WBC 10.3 9.4  HGB 8.7* 8.7*  HCT 28.1* 28.2*  PLT 187 187   BMET Recent Labs    12/04/18 0714 12/04/18 1827  NA 134* 132*  K 5.4* 5.8*  CL 94* 95*  CO2 22 21*  GLUCOSE 92 137*  BUN 97* 70*  CREATININE 12.64* 10.17*  CALCIUM 9.1 8.6*    Studies/Results: No results found.  Assessment/Plan: Acute on chronic pain in the setting of pyelonephritis, nephrostomy tube placement, neurogenic bladder aggravated by enema  LOS: 9 days  will increase strength of duragesic patch, monitor; if he does not have some response to duragesic 25 mcg, consider ER oxycodone   Bonna Gains 12/05/2018, 12:38 PM

## 2018-12-05 NOTE — Plan of Care (Signed)
°  Problem: Coping: °Goal: Level of anxiety will decrease °Outcome: Progressing °  °

## 2018-12-05 NOTE — Progress Notes (Signed)
[facility] Progress Note   Subjective:  Seen in room. Pain bad overnight, improved today sitting up. S/p HD yesteday with 2L UF. No CP/dyspnea. Looks like plan is pain control then d/c with urology follow-up. He is willing to see [facility] again.  Objective Vitals:   12/04/18 1130 12/04/18 1708 12/04/18 2103 12/05/18 0328  BP: (!) 108/45 (!) 104/56 119/65 109/69  Pulse: 68 71 75 80  Resp: 16 18 16 16   Temp: 98 F (36.7 C) 98 F (36.7 C) 98.1 F (36.7 C) 98.9 F (37.2 C)  TempSrc: Oral Oral Oral Oral  SpO2: 98% 98% 97% 100%  Weight: 121.7 kg  122.3 kg   Height:       Physical Exam General:Chronically ill appearing man, pain more controlled today. Heart:RRR; 3/6 systolic murmur Lungs:CTAB; no rales Abdomen:soft. L flank with perc nephrostomy drain. Extremities:Trace LLE edema Dialysis Access:R femoral cath  Additional Objective Labs: Basic Metabolic Panel: Recent Labs  Lab 12/03/18 0428 12/04/18 0714 12/04/18 1827  NA 134* 134* 132*  K 5.2* 5.4* 5.8*  CL 94* 94* 95*  CO2 21* 22 21*  GLUCOSE 95 92 137*  BUN 82* 97* 70*  CREATININE 11.11* 12.64* 10.17*  CALCIUM 8.9 9.1 8.6*  PHOS 4.6 6.0* 4.7*   Liver Function Tests: Recent Labs  Lab 12/03/18 0428 12/04/18 0714 12/04/18 1827  ALBUMIN 2.6* 2.6* 2.6*   CBC: Recent Labs  Lab 12/01/18 0501 12/02/18 0639 12/03/18 0428 12/04/18 0714 12/05/18 0340  WBC 13.3* 10.4 10.0 10.3 9.4  HGB 9.6* 9.0* 8.6* 8.7* 8.7*  HCT 29.7* 27.7* 27.4* 28.1* 28.2*  MCV 94.3 95.2 96.1 97.2 98.9  PLT 184 181 173 187 187   Medications: . [START ON 12/06/2018]  ceFAZolin (ANCEF) IV    .  ceFAZolin (ANCEF) IV 2 g (12/04/18 1801)   . ALPRAZolam  0.5 mg Oral Once  . calcium acetate  667 mg Oral TID WC  . Chlorhexidine Gluconate Cloth  6 each Topical Q0600  . famotidine  20 mg Oral Q1400  . feeding supplement (NEPRO CARB STEADY)  237 mL Oral BID BM  . fentaNYL  1 patch Transdermal Q72H  .  levothyroxine  125 mcg Oral QAC breakfast  . LORazepam  1 mg Oral Daily  . multivitamin  1 tablet Oral QHS  . polyethylene glycol  17 g Oral BID  . sodium chloride flush  5 mL Intracatheter Q8H  . sodium polystyrene  15 g Oral Once per day on Sun Sat  . traZODone  100 mg Oral QHS    Dialysis Orders: MWF at [facility] 4:45hr, 350/800, EDW 123kg (getting under), 2K/2.25Ca, TDC, heparin 3700 bolus - Hectoral 81mcg IV q HD - Mircera 56mcg IV q 4 (last 11/22) - Venofer 100 x 10 ordered  Assessment/Plan: 1.L ureteral calculi/pyelonephritis:With moderate hydronephrosis and calyceal rupture. S/p L percutaneous nephrostromy tube placement by IR on 11/26.               # Cx grew Fusobacterium. Initially onIV Ceftriaxone -> now changed to Cefazolin per ID (6 weeks, followed by PO amoxicillin for another 4 weeks). There was concern as this bacteria is associated with colon malignancies - s/p flex sig which was negative.              # Urology recommending L nephrectomy - consideration B nephrectomy with takedown urostomy. [facility]/WF refused transfer. Will need to be arranged as outpatient - pt seems now willing to use local uro group.  2.  ESRD:Continue HD on MWF schedule -for HD 12/4. 3.HTN/volume:BPok, under previousoutpatient EDW.UF as tolerated. 4. Anemia:Hgb8.7, not due for ESA yet. Will hold IV Fe while on IV antibiotics. 5. Secondary hyperparathyroidism:Corrected calciumhigh, hectorol reducedthen held.Phoslow, calcium acetate decreasedthen held as well on 11/27. Phos 4.6 - resumed at lower dose (1/meals). 6. Nutrition:Alb low, continue Nepro. 7. Spina bifida(neurogenic bladder)  [name], PA-C 12/05/2018, 9:27 AM   [facility] Pager: [phone]

## 2018-12-06 DIAGNOSIS — N186 End stage renal disease: Secondary | ICD-10-CM | POA: Diagnosis not present

## 2018-12-06 DIAGNOSIS — Z87728 Personal history of other specified (corrected) congenital malformations of nervous system and sense organs: Secondary | ICD-10-CM | POA: Diagnosis not present

## 2018-12-06 DIAGNOSIS — R109 Unspecified abdominal pain: Secondary | ICD-10-CM | POA: Diagnosis not present

## 2018-12-06 DIAGNOSIS — N200 Calculus of kidney: Secondary | ICD-10-CM | POA: Diagnosis not present

## 2018-12-06 LAB — RENAL FUNCTION PANEL
Albumin: 2.7 g/dL — ABNORMAL LOW (ref 3.5–5.0)
Albumin: 2.8 g/dL — ABNORMAL LOW (ref 3.5–5.0)
Anion gap: 15 (ref 5–15)
Anion gap: 14 (ref 5–15)
BUN: 94 mg/dL — ABNORMAL HIGH (ref 6–20)
BUN: 73 mg/dL — ABNORMAL HIGH (ref 6–20)
CO2: 22 mmol/L (ref 22–32)
CO2: 22 mmol/L (ref 22–32)
Calcium: 8.9 mg/dL (ref 8.9–10.3)
Calcium: 9.1 mg/dL (ref 8.9–10.3)
Chloride: 95 mmol/L — ABNORMAL LOW (ref 98–111)
Chloride: 97 mmol/L — ABNORMAL LOW (ref 98–111)
Creatinine, Ser: 13.21 mg/dL — ABNORMAL HIGH (ref 0.61–1.24)
Creatinine, Ser: 10.67 mg/dL — ABNORMAL HIGH (ref 0.61–1.24)
GFR calc Af Amer: 4 mL/min — ABNORMAL LOW (ref >60)
GFR calc Af Amer: 6 mL/min — ABNORMAL LOW (ref >60)
GFR calc non Af Amer: 4 mL/min — ABNORMAL LOW (ref >60)
GFR calc non Af Amer: 5 mL/min — ABNORMAL LOW (ref >60)
Glucose, Bld: 141 mg/dL — ABNORMAL HIGH (ref 70–99)
Glucose, Bld: 132 mg/dL — ABNORMAL HIGH (ref 70–99)
Phosphorus: 6.3 mg/dL — ABNORMAL HIGH (ref 2.5–4.6)
Phosphorus: 5.5 mg/dL — ABNORMAL HIGH (ref 2.5–4.6)
Potassium: 6.2 mmol/L — ABNORMAL HIGH (ref 3.5–5.1)
Potassium: 5.3 mmol/L — ABNORMAL HIGH (ref 3.5–5.1)
Sodium: 132 mmol/L — ABNORMAL LOW (ref 135–145)
Sodium: 133 mmol/L — ABNORMAL LOW (ref 135–145)

## 2018-12-06 LAB — CBC
HCT: 28.2 % — ABNORMAL LOW (ref 39.0–52.0)
Hemoglobin: 8.6 g/dL — ABNORMAL LOW (ref 13.0–17.0)
MCH: 30.5 pg (ref 26.0–34.0)
MCHC: 30.5 g/dL (ref 30.0–36.0)
MCV: 100 fL (ref 80.0–100.0)
Platelets: 220 10*3/uL (ref 150–400)
RBC: 2.82 MIL/uL — ABNORMAL LOW (ref 4.22–5.81)
RDW: 16.2 % — ABNORMAL HIGH (ref 11.5–15.5)
WBC: 9.1 10*3/uL (ref 4.0–10.5)
nRBC: 0 % (ref 0.0–0.2)

## 2018-12-06 MED ORDER — FENTANYL 12 MCG/HR TD PT72
1.0000 | MEDICATED_PATCH | TRANSDERMAL | Status: DC
  Administered 2018-12-06: 1 via TRANSDERMAL
  Filled 2018-12-06: qty 1

## 2018-12-06 MED ORDER — HEPARIN SODIUM (PORCINE) 1000 UNIT/ML IJ SOLN
INTRAMUSCULAR | Status: AC
  Filled 2018-12-06: qty 3

## 2018-12-06 MED ORDER — ONDANSETRON HCL 4 MG/2ML IJ SOLN
INTRAMUSCULAR | Status: AC
  Filled 2018-12-06: qty 2

## 2018-12-06 MED ORDER — ALTEPLASE 2 MG IJ SOLR
INTRAMUSCULAR | Status: AC
  Administered 2018-12-06: 4 mg
  Filled 2018-12-06: qty 4

## 2018-12-06 MED ORDER — HEPARIN SODIUM (PORCINE) 1000 UNIT/ML IJ SOLN
2500.0000 [IU] | Freq: Once | INTRAMUSCULAR | Status: AC
  Administered 2018-12-06: 2500 [IU] via INTRAVENOUS

## 2018-12-06 MED ORDER — HYDROMORPHONE HCL 1 MG/ML IJ SOLN
INTRAMUSCULAR | Status: AC
  Filled 2018-12-06: qty 2

## 2018-12-06 MED ORDER — ALTEPLASE 2 MG IJ SOLR
4.0000 mg | Freq: Once | INTRAMUSCULAR | Status: AC
  Administered 2018-12-06: 13:00:00 4 mg

## 2018-12-06 NOTE — Progress Notes (Signed)
PROGRESS NOTE  [name] [id number] DOB: November 05, 1965   PCP: [name], MD  Patient is from: Home  DOA: 11/25/2018 LOS: 11  Brief Narrative / Interim history: 53 year old male with history of ESRD on HD MWF, spinal bifid, neurogenic bladder/ileal conduit, seizures, migraine headache, HTN/HoTN, kidney stones, GERD and anxiety presenting with left flank pain on 11/25/2018 and admitted for left moderate hydronephrosis, calyceal rupture, nonobstructing bilateral nephrolithiasis and developing pyelonephritis as noted on CT. Nephrology and urology consulted.   Patient was started on IV ceftriaxone for developing pyelonephritis.  Urology recommended left percutaneous nephrostomy by IR, and outpatient follow-up.  Seen by IR 11/26/2018.  Per IR note, declined percutaneous nephrostomy, asking for nephrectomy. However, he later agreed to nephrostomy tube placement.  IR reconsulted 11/27/2018.  Underwent left nephrostomy tube placement on 11/28/2018. Nephrostomy output minimal but with dark red urine. 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 Patient continues to endorse severe left flank pain.  Urology reconsulted and recommended adjustment of his pain medications, treating his constipation,  continuing appropriate antibiotics and outpatient follow-up to discuss further plan.  Unfortunately, pain was not well controlled with high-dose opiates including IV Dilaudid and Percocet.  CT abdomen and pelvis without contrast obtained on 12/01/2018 and revealed previously noted multiple nonobstructing calculi in the collecting systems of both kidneys, 4 mm calculus in the proximal third of left ureter and diffuse soft tissue thickening associated with the left ureter with extensive surrounding inflammatory changes and appropriately positioned PCN.  Urology reconsulted 12/02/2018 again but dismissed by patient.  Urology suggested transfer to tertiary/academic center for  further care.  ID consulted for higher level/possible renal abscess are recommended IV Ancef with HD for 4 weeks followed by p.o. amoxicillin for 2 weeks, and outpatient follow-up in ID clinic in 3 to 4 weeks.  [facility] consulted for proctitis noted on initial CT. Sigmoidoscopy on 12/03/2018 without significant finding.  Recommended good bowel regimen for constipation and signed off.  12/03/2018-discussed with urologist at [facility], Dr. Alroy Dust who declined transfer stating they have no additional intervention to offer.  Also discussed with Dr. Sheral Flow, at [facility] who declined transfer for the same reason.  Consulted Dr. Maryjean Ka at [facility].   Subjective: No major events overnight of this morning.  Sitting up bedside chair going through his phone.  Reports having a rough night due to pain.  Reports severe pain from nephrostomy site and left flank.  Not able to describe the pain.  Rates his nephrostomy site pain as 9/10 and his flank pain is 9.5/10.   Objective: Vitals:   12/06/18 1200 12/06/18 1230 12/06/18 1233 12/06/18 1320  BP: 104/65 98/62 (!) 105/56 (!) 104/54  Pulse: 73 74 88 80  Resp:   19 18  Temp:   98.3 F (36.8 C) 99 F (37.2 C)  TempSrc:   Oral Oral  SpO2:   96% 100%  Weight:   121.2 kg   Height:        Intake/Output Summary (Last 24 hours) at 12/06/2018 1426 Last data filed at 12/06/2018 1233 Gross per 24 hour  Intake 1077 ml  Output 2039 ml  Net -962 ml   Filed Weights   12/04/18 2103 12/06/18 0855 12/06/18 1233  Weight: 122.3 kg 123.8 kg 121.2 kg    Examination: GENERAL: No acute distress.  Appears well.  HEENT: MMM.  Vision and hearing grossly intact.  NECK: Supple.  No apparent JVD.  RESP:  No IWOB. Good air movement bilaterally. CVS:  RRR. Heart sounds normal.  ABD/GI/GU: Bowel sounds present. Soft. Non tender.  Urostomy over RLQ.  Nephrostomy over RLQ with small dark red urine. MSK/EXT:  Moves extremities. No apparent  deformity or edema.  SKIN: Old healed vertical surgical scar over lower abdomen.  NEURO: Awake, alert and oriented appropriately.  No apparent focal neuro deficit. PSYCH: Flat affect.  Looks unhappy.  CT renal stone study on 11/25/2018 - moderate left hydroureteronephrosis, extensive left perinephric stranding and trace free fluid, nonobstructing bilateral nephrolithiasis, possible proctitis, multilevel degenerative changes in spines, hips and SI joints  CT abdomen and pelvis w/o contrast on 12/01/2018 -Previously noted multiple nonobstructing calculi in the collecting systems of both kidneys, 4 mm calculus in the proximal third of left ureter and diffuse soft tissue thickening associated with the left ureter with extensive surrounding inflammatory changes, stable cyst and appropriately positioned PCN.  Assessment & Plan: Left flank pain/left moderate hydronephrosis/possible left calyceal rupture/nonobstructing bilateral nephrolithiasis/developing left pyelonephritis/possible renal abscess.  -CT findings as above. 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   -Per ID, IV Ancef 11/30>> to complete 6 weeks of tx. Can switch to Amox after 4 wks.  ID f/u outpt in 3 to 4 weeks -Urology reconsulted for intractable left flank pain and repeat CT finding but dismissed by patient -Urology suggested transfer to tertiary/academic center -Transfer to [facility] declined by respective urologist -Palliative care consulted for pain management but recommended consulting Dr. Clydell Hakim -Appreciate help by Dr. Maryjean Ka from [place] neurosurgery/pain clinic -following. -Senokot and MiraLAX twice daily as needed for constipation  Anemia of chronic disease: has small dark red but clearer output in nephrostomy bag.  H&H stable. -Resume aspirin.  Hold Plavix for now -Monitor  H&H  ESRD on HD MWF Bone mineral disorder Electrolyte abnormalities-hyponatremia/hyperkalemia/hyperphosphatemia Anion gap metabolic acidosis/azotemia -Nephrology following.   Essential hypertension/hypotension: Normotensive -Continue midodrine  Anxiety/insomnia/chronic pain syndrome -Continue home Ativan, trazodone and Ambien -Pain control as above.  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  History of peripheral arterial disease -Holding home Plavix and aspirin in the setting of hematuria.  Thrombocytopenia: Resolved.  Hematuria: H&H stable. -Continue holding Plavix and aspirin.  Proctitis: Noted on CT renal stone study on 11/26/2018.  Ruled out for sigmoidoscopy on 12/03/2018. -Sigmoidoscopy on 12/03/2018 without significant finding.  GI signed off.     Nutrition Problem: Inadequate oral intake Etiology: poor appetite  Signs/Symptoms: meal completion < 50%  Interventions: Nepro shake, MVI   DVT prophylaxis: SCD Code Status: Full code Family Communication: Patient and/or RN. Available if any question. Disposition Plan: Remains inpatient due to severe left flank pain  Consultants: Urology (off), IR (off), nephrology, neurosurgery/pain  Procedures:  11/28/2018-percutaneous nephrostomy tube placement 12/03/2018-sigmoidoscopy without significant finding.   Microbiology summarized: COVID-19 screen negative MRSA PCR negative Nephrostomy drain culture beta-lactamase negative Fusobacterium nucleatum.  Sch Meds:  Scheduled Meds:  ALPRAZolam  0.5 mg Oral Once   calcium acetate  667 mg Oral TID WC   Chlorhexidine Gluconate Cloth  6 each Topical Q0600   famotidine  20 mg Oral Q1400   feeding supplement (NEPRO CARB STEADY)  237 mL Oral BID BM   fentaNYL  1 patch Transdermal Q72H   levothyroxine  125 mcg Oral QAC breakfast    LORazepam  1 mg Oral Daily  multivitamin  1 tablet Oral QHS   polyethylene glycol  17 g Oral BID   sodium chloride flush  5 mL Intracatheter Q8H   sodium polystyrene  15 g Oral Once per day on Sun Sat   traZODone  100 mg Oral QHS   Continuous Infusions:   ceFAZolin (ANCEF) IV Stopped (12/06/18 1312)    ceFAZolin (ANCEF) IV 2 g (12/04/18 1801)   PRN Meds:.acetaminophen **OR** acetaminophen, feeding supplement (NEPRO CARB STEADY), fluticasone, heparin, HYDROmorphone (DILAUDID) injection, ondansetron (ZOFRAN) IV, [DISCONTINUED] oxyCODONE-acetaminophen **AND** oxyCODONE, oxyCODONE-acetaminophen, zolpidem  Antimicrobials: Anti-infectives (From admission, onward)   Start     Dose/Rate Route Frequency Ordered Stop   12/06/18 1800  ceFAZolin (ANCEF) 3 g in dextrose 5 % 50 mL IVPB     3 g 100 mL/hr over 30 Minutes Intravenous Every Fri (1800) 12/04/18 1120 01/10/19 1759   12/04/18 1800  ceFAZolin (ANCEF) IVPB 2g/100 mL premix  Status:  Discontinued     2 g 200 mL/hr over 30 Minutes Intravenous Every M-W-F (1800) 12/03/18 0830 12/04/18 1120   12/04/18 1800  ceFAZolin (ANCEF) IVPB 2g/100 mL premix     2 g 200 mL/hr over 30 Minutes Intravenous Once per day on Mon Wed 12/04/18 1120 01/08/19 1759   12/01/18 1600  cefTRIAXone (ROCEPHIN) 2 g in sodium chloride 0.9 % 100 mL IVPB  Status:  Discontinued     2 g 200 mL/hr over 30 Minutes Intravenous Every 24 hours 12/01/18 1510 12/01/18 1511   12/01/18 1600  cefTRIAXone (ROCEPHIN) 1 g in sodium chloride 0.9 % 100 mL IVPB     1 g 200 mL/hr over 30 Minutes Intravenous Every 24 hours 12/01/18 1511 12/03/18 1652   12/01/18 0000  cephALEXin (KEFLEX) 500 MG capsule     500 mg Oral 2 times daily 12/01/18 1021 12/06/18 2359   11/26/18 1700  cefTRIAXone (ROCEPHIN) 1 g in sodium chloride 0.9 % 100 mL IVPB  Status:  Discontinued     1 g 200 mL/hr over 30 Minutes Intravenous Every 24 hours 11/26/18 1543 12/01/18 1436       I have personally reviewed  the following labs and images: CBC: Recent Labs  Lab 12/02/18 0639 12/03/18 0428 12/04/18 0714 12/05/18 0340 12/06/18 0916  WBC 10.4 10.0 10.3 9.4 9.1  HGB 9.0* 8.6* 8.7* 8.7* 8.6*  HCT 27.7* 27.4* 28.1* 28.2* 28.2*  MCV 95.2 96.1 97.2 98.9 100.0  PLT 181 173 187 187 220   BMP &GFR Recent Labs  Lab 11/30/18 0453 12/01/18 0501 12/02/18 0639 12/03/18 0428 12/04/18 0714 12/04/18 1827 12/06/18 0916  NA 135 133* 134* 134* 134* 132* 132*  K 4.6 4.7 5.3* 5.2* 5.4* 5.8* 6.2*  CL 95* 95* 94* 94* 94* 95* 95*  CO2 25 21* 23 21* 22 21* 22  GLUCOSE 108* 107* 87 95 92 137* 141*  BUN 65* 86* 103* 82* 97* 70* 94*  CREATININE 9.39* 10.73* 12.93* 11.11* 12.64* 10.17* 13.21*  CALCIUM 8.4* 9.2 9.4 8.9 9.1 8.6* 8.9  MG 2.0 2.0 2.1  --   --   --   --   PHOS 2.4* 2.9 4.0 4.6 6.0* 4.7* 6.3*   Estimated Creatinine Clearance: 8.6 mL/min (A) (by C-G formula based on SCr of 13.21 mg/dL (H)). Liver & Pancreas: Recent Labs  Lab 12/02/18 0639 12/03/18 0428 12/04/18 0714 12/04/18 1827 12/06/18 0916  ALBUMIN 2.6* 2.6* 2.6* 2.6* 2.7*   No results for input(s): LIPASE, AMYLASE in the last 168 hours. No results for input(s): AMMONIA  in the last 168 hours. Diabetic: No results for input(s): HGBA1C in the last 72 hours. No results for input(s): GLUCAP in the last 168 hours. Cardiac Enzymes: No results for input(s): CKTOTAL, CKMB, CKMBINDEX, TROPONINI in the last 168 hours. No results for input(s): PROBNP in the last 8760 hours. Coagulation Profile: Recent Labs  Lab 12/01/18 0501  INR 1.0   Thyroid Function Tests: No results for input(s): TSH, T4TOTAL, FREET4, T3FREE, THYROIDAB in the last 72 hours. Lipid Profile: No results for input(s): CHOL, HDL, LDLCALC, TRIG, CHOLHDL, LDLDIRECT in the last 72 hours. Anemia Panel: No results for input(s): VITAMINB12, FOLATE, FERRITIN, TIBC, IRON, RETICCTPCT in the last 72 hours. Urine analysis:    Component Value Date/Time   COLORURINE RED (A)  07/31/2010 0410   APPEARANCEUR TURBID (A) 07/31/2010 0410   LABSPEC 1.044 (H) 07/31/2010 0410   PHURINE 6.5 07/31/2010 0410   GLUCOSEU 100 (A) 07/31/2010 0410   HGBUR LARGE (A) 07/31/2010 0410   BILIRUBINUR LARGE (A) 07/31/2010 0410   KETONESUR 40 (A) 07/31/2010 0410   PROTEINUR >300 (A) 07/31/2010 0410   UROBILINOGEN 4.0 (H) 07/31/2010 0410   NITRITE POSITIVE (A) 07/31/2010 0410   LEUKOCYTESUR LARGE (A) 07/31/2010 0410   Sepsis Labs: Invalid input(s): PROCALCITONIN, [place]  Microbiology: Recent Results (from the past 240 hour(s))  MRSA PCR Screening     Status: None   Collection Time: 11/27/18  7:31 AM   Specimen: Nasal Mucosa; Nasopharyngeal  Result Value Ref Range Status   MRSA by PCR NEGATIVE NEGATIVE Final    Comment:        The GeneXpert MRSA Assay (FDA approved for NASAL specimens only), is one component of a comprehensive MRSA colonization surveillance program. It is not intended to diagnose MRSA infection nor to guide or monitor treatment for MRSA infections. Performed at [facility], [place] [address] [place], [place] [address]   Aerobic/Anaerobic Culture (surgical/deep wound)     Status: None   Collection Time: 11/28/18 10:40 AM   Specimen: Abscess; Urine  Result Value Ref Range Status   Specimen Description ABSCESS LEFT NEPHROSTOMY PLACEMENT  Final   Special Requests Normal  Final   Gram Stain   Final    ABUNDANT WBC PRESENT,BOTH PMN AND MONONUCLEAR NO ORGANISMS SEEN    Culture   Final    FEW FUSOBACTERIUM NUCLEATUM BETA LACTAMASE NEGATIVE Performed at [facility], [place] [address] [place], [place] [address]    Report Status 12/03/2018 FINAL  Final    Radiology Studies: No results found.   Swayzee Wadley T. [place]  If 7PM-7AM, please contact night-coverage [email or web address] Password [email or web address] 12/06/2018, 2:26 PM

## 2018-12-06 NOTE — Progress Notes (Addendum)
Subjective: Patient reports pain modestly improving abdominal flank pain; now worsening pain over nephrostomy site  Objective: Vital signs in last 24 hours: Temp:  [98.3 F (36.8 C)-99.1 F (37.3 C)] 99 F (37.2 C) (12/04 1320) Pulse Rate:  [73-88] 80 (12/04 1320) Resp:  [17-19] 18 (12/04 1320) BP: (90-135)/(45-71) 104/54 (12/04 1320) SpO2:  [96 %-100 %] 100 % (12/04 1320) Weight:  [121.2 kg-123.8 kg] 121.2 kg (12/04 1233)  Intake/Output from previous day: 12/03 0701 - 12/04 0700 In: 1197 [P.O.:1197] Out: -  Intake/Output this shift: Total I/O In: 528.2 [P.O.:237; NG/GT:240; IV Piggyback:51.2] Out: 2039 [Other:2039]  Resp: normal excursion, no R/W GI: Nondistended Wound: tender superfically around nephrostomy site. no obvious induration, redness  Lab Results: Recent Labs    12/05/18 0340 12/06/18 0916  WBC 9.4 9.1  HGB 8.7* 8.6*  HCT 28.2* 28.2*  PLT 187 220   BMET Recent Labs    12/04/18 1827 12/06/18 0916  NA 132* 132*  K 5.8* 6.2*  CL 95* 95*  CO2 21* 22  GLUCOSE 137* 141*  BUN 70* 94*  CREATININE 10.17* 13.21*  CALCIUM 8.6* 8.9    Studies/Results: No results found.  Assessment/Plan: Acute on chronic pain associated with pyelonephritis, nephrostomy tube placement  LOS: 10 days  1) step increase in duragesic; 2) patient concerned he will be discharged without supplemental pain regimen. I have assured him that he will not. He has been discussing his current care with his PCP and reports that Dr. Delaney Meigs (sp?) will assume responsibility when he is outpatient   Bonna Gains 12/06/2018, 2:58 PM

## 2018-12-06 NOTE — Plan of Care (Signed)
°  Problem: Coping: °Goal: Level of anxiety will decrease °Outcome: Progressing °  °

## 2018-12-06 NOTE — Plan of Care (Signed)
  Problem: Coping: Goal: Level of anxiety will decrease Outcome: Progressing   Problem: Elimination: Goal: Will not experience complications related to bowel motility Outcome: Progressing   Problem: Education: Goal: Individualized Educational Video(s) Outcome: Progressing

## 2018-12-06 NOTE — Progress Notes (Signed)
[name] Progress Note   Subjective: Seen prior to HD. Disgusted with ongoing issues with TDC and C/O of being hot. Otherwise, seems OK. Says he continues to have pain issues. Pain management on board.   Objective Vitals:   12/05/18 0328 12/05/18 0953 12/05/18 1818 12/06/18 0556  BP: 109/69 (!) 94/50 (!) 106/52 135/70  Pulse: 80 77 79 83  Resp: 16 18 18 18   Temp: 98.9 F (37.2 C) 97.6 F (36.4 C) 98.6 F (37 C) 99.1 F (37.3 C)  TempSrc: Oral  Oral Oral  SpO2: 100% 98% 100% 98%  Weight:      Height:       Physical Exam General: Chronically ill appearing male in NAD 123XX123, 2/6 systolic M Lungs: CTAB Abdomen: obese, NT Extremities: no LE edema Dialysis Access: R Fem TDC known for difficulty. Blood lines connected. May require cath flo prior to HD    Additional Objective Labs: Basic Metabolic Panel: Recent Labs  Lab 12/03/18 0428 12/04/18 0714 12/04/18 1827  NA 134* 134* 132*  K 5.2* 5.4* 5.8*  CL 94* 94* 95*  CO2 21* 22 21*  GLUCOSE 95 92 137*  BUN 82* 97* 70*  CREATININE 11.11* 12.64* 10.17*  CALCIUM 8.9 9.1 8.6*  PHOS 4.6 6.0* 4.7*   Liver Function Tests: Recent Labs  Lab 12/03/18 0428 12/04/18 0714 12/04/18 1827  ALBUMIN 2.6* 2.6* 2.6*   No results for input(s): LIPASE, AMYLASE in the last 168 hours. CBC: Recent Labs  Lab 12/01/18 0501 12/02/18 0639 12/03/18 0428 12/04/18 0714 12/05/18 0340  WBC 13.3* 10.4 10.0 10.3 9.4  HGB 9.6* 9.0* 8.6* 8.7* 8.7*  HCT 29.7* 27.7* 27.4* 28.1* 28.2*  MCV 94.3 95.2 96.1 97.2 98.9  PLT 184 181 173 187 187   Blood Culture    Component Value Date/Time   SDES ABSCESS LEFT NEPHROSTOMY PLACEMENT 11/28/2018 1040   SPECREQUEST Normal 11/28/2018 1040   CULT  11/28/2018 1040    FEW FUSOBACTERIUM NUCLEATUM BETA LACTAMASE NEGATIVE Performed at [facility] [address] [place], [place] [address]    REPTSTATUS 12/03/2018 FINAL 11/28/2018 1040    Cardiac Enzymes: No results  for input(s): CKTOTAL, CKMB, CKMBINDEX, TROPONINI in the last 168 hours. CBG: No results for input(s): GLUCAP in the last 168 hours. Iron Studies: No results for input(s): IRON, TIBC, TRANSFERRIN, FERRITIN in the last 72 hours. @lablastinr3 @ Studies/Results: No results found. Medications: .  ceFAZolin (ANCEF) IV    .  ceFAZolin (ANCEF) IV 2 g (12/04/18 1801)   . ALPRAZolam  0.5 mg Oral Once  . calcium acetate  667 mg Oral TID WC  . Chlorhexidine Gluconate Cloth  6 each Topical Q0600  . famotidine  20 mg Oral Q1400  . feeding supplement (NEPRO CARB STEADY)  237 mL Oral BID BM  . fentaNYL  1 patch Transdermal Q72H  . levothyroxine  125 mcg Oral QAC breakfast  . LORazepam  1 mg Oral Daily  . multivitamin  1 tablet Oral QHS  . polyethylene glycol  17 g Oral BID  . sodium chloride flush  5 mL Intracatheter Q8H  . sodium polystyrene  15 g Oral Once per day on Sun Sat  . traZODone  100 mg Oral QHS     MWF at [facility] 4:45hr, 350/800, EDW 123kg (getting under), 2K/2.25Ca, TDC, heparin 3700 bolus - Hectoral 54mcg IV q HD - Mircera 57mcg IV q 4 (last 11/22) - Venofer 100 x 10 ordered  Assessment/Plan: 1.L ureteral calculi/pyelonephritis:With moderate hydronephrosis  and calyceal rupture. S/p L percutaneous nephrostromy tube placement by IR on 11/26.  # Cx grew Fusobacterium. Initially onIV Ceftriaxone-> now changed to Cefazolin per ID (6 weeks, followed by PO amoxicillin for another 4 weeks). There was concern as this bacteria is associated with colon malignancies - s/p flex sig which was negative.  # Urology recommending L nephrectomy - consideration B nephrectomy with takedown urostomy. [facility]/WF refused transfer. Will need to be arranged as outpatient - pt seems now willing to use local uro group.  2. ESRD:Continue HD on MWF schedule -HD today on schedule. Usual issues with TDC. Doesn't work well on good day, worse without heparin. Resume tight heparin. Pack  with cath flo post HD.  3.HTN/volume:BPok, under previousoutpatient EDW.UF as tolerated. 4. Anemia:Hgb8.7, not due for ESA yet. Will hold IV Fe while on IV antibiotics. 5. Secondary hyperparathyroidism:Corrected calciumhigh, hectorol reducedthen held.Phoslow, calcium acetate decreasedthen held as well on 11/27. Phos 4.6- resumedat lower dose (1/meals). 6. Nutrition:Alb low, continue Nepro. 7. Spina bifida(neurogenic bladder)  [name]. Brown NP-C 12/06/2018, 9:08 AM  [employer] [phone]

## 2018-12-07 MED ORDER — PRO-STAT SUGAR FREE PO LIQD
30.0000 mL | Freq: Two times a day (BID) | ORAL | Status: DC
  Administered 2018-12-08: 30 mL via ORAL
  Filled 2018-12-07 (×7): qty 30

## 2018-12-07 MED ORDER — LACTULOSE 10 GM/15ML PO SOLN: 20.0000 g | Freq: Two times a day (BID) | ORAL | Status: DC | PRN

## 2018-12-07 NOTE — Progress Notes (Addendum)
I have seen and examined this patient and agree with the plan of care .  Sherril Croon 12/07/2018, 12:20 PM  [facility] Progress Note   Subjective: Up in chair. Ongoing pain issue. Considering nephrectomy.   Objective Vitals:   12/06/18 2343 12/07/18 0116 12/07/18 0404 12/07/18 0926  BP: (!) 86/56 114/70 (!) 113/59 116/61  Pulse: 69 71 72 72  Resp:   18 18  Temp:   98.6 F (37 C) 98.8 F (37.1 C)  TempSrc:   Oral Oral  SpO2:   94% 98%  Weight:      Height:       Physical Exam General: Disheveled appearing male in NAD [id number], 2/6 systolic M Lungs: CTAB Abdomen: obese, NT Extremities: no LE edema Dialysis Access: R Fem TDC known for difficulty. Drsg CDI.  :  Additional Objective Labs: Basic Metabolic Panel: Recent Labs  Lab 12/04/18 1827 12/06/18 0916 12/06/18 1711  NA 132* 132* 133*  K 5.8* 6.2* 5.3*  CL 95* 95* 97*  CO2 21* 22 22  GLUCOSE 137* 141* 132*  BUN 70* 94* 73*  CREATININE 10.17* 13.21* 10.67*  CALCIUM 8.6* 8.9 9.1  PHOS 4.7* 6.3* 5.5*   Liver Function Tests: Recent Labs  Lab 12/04/18 1827 12/06/18 0916 12/06/18 1711  ALBUMIN 2.6* 2.7* 2.8*   No results for input(s): LIPASE, AMYLASE in the last 168 hours. CBC: Recent Labs  Lab 12/02/18 0639 12/03/18 0428 12/04/18 0714 12/05/18 0340 12/06/18 0916  WBC 10.4 10.0 10.3 9.4 9.1  HGB 9.0* 8.6* 8.7* 8.7* 8.6*  HCT 27.7* 27.4* 28.1* 28.2* 28.2*  MCV 95.2 96.1 97.2 98.9 100.0  PLT 181 173 187 187 220   Blood Culture    Component Value Date/Time   SDES ABSCESS LEFT NEPHROSTOMY PLACEMENT 11/28/2018 1040   SPECREQUEST Normal 11/28/2018 1040   CULT  11/28/2018 1040    FEW FUSOBACTERIUM NUCLEATUM BETA LACTAMASE NEGATIVE Performed at [facility] [address] [place], [place] [address]    REPTSTATUS 12/03/2018 FINAL 11/28/2018 1040    Cardiac Enzymes: No results for input(s): CKTOTAL, CKMB, CKMBINDEX, TROPONINI in the last 168 hours. CBG: No results for  input(s): GLUCAP in the last 168 hours. Iron Studies: No results for input(s): IRON, TIBC, TRANSFERRIN, FERRITIN in the last 72 hours. @lablastinr3 @ Studies/Results: No results found. Medications: .  ceFAZolin (ANCEF) IV Stopped (12/06/18 1312)  .  ceFAZolin (ANCEF) IV 2 g (12/04/18 1801)   . ALPRAZolam  0.5 mg Oral Once  . calcium acetate  667 mg Oral TID WC  . Chlorhexidine Gluconate Cloth  6 each Topical Q0600  . famotidine  20 mg Oral Q1400  . feeding supplement (NEPRO CARB STEADY)  237 mL Oral BID BM  . fentaNYL  1 patch Transdermal Q72H  . fentaNYL  1 patch Transdermal Q72H  . levothyroxine  125 mcg Oral QAC breakfast  . LORazepam  1 mg Oral Daily  . multivitamin  1 tablet Oral QHS  . polyethylene glycol  17 g Oral BID  . sodium chloride flush  5 mL Intracatheter Q8H  . sodium polystyrene  15 g Oral Once per day on Sun Sat  . traZODone  100 mg Oral QHS     MWF at [facility] 4:45hr, 350/800, EDW 123kg (getting under), 2K/2.25Ca, TDC, heparin 3700 bolus - Hectoral 1mcg IV q HD - Mircera 64mcg IV q 4 (last 11/22) - Venofer 100 x 10 ordered  Assessment/Plan: 1.L ureteral calculi/pyelonephritis:With moderate hydronephrosis and calyceal rupture. S/p  L percutaneous nephrostromy tube placement by IR on 11/26.  # Cx grew Fusobacterium. Initially onIV Ceftriaxone-> now changed to Cefazolin per ID (6 weeks, followed by PO amoxicillin for another 4 weeks). There was concern as this bacteria is associated with colon malignancies - s/p flex sigwhich was negative.  # Urology recommending L nephrectomy - consideration B nephrectomy with takedown urostomy. [facility]/WF refused transfer. Will need to be arranged as outpatient - pt seems now willing to use local uro group.  2. ESRD:Continue HD on MWF schedule -Next HD 12/07. Usual issues with TDC. Doesn't work well on good day, worse without heparin. Resume tight heparin. Pack with cath flo post HD.   3.HTN/volume:BPok, under previousoutpatient EDW.UF as tolerated. 4. Anemia:Hgb8.6, not due for ESA yet. Will hold IV Fe while on IV antibiotics. 5. Secondary hyperparathyroidism:Corrected calciumhigh, hectorol reducedthen held.Phoslow, calcium acetate decreasedthen held as well on 11/27. Phos 5.5-resumedat lower dose (1/meals). 6. Nutrition:Alb low, continue Nepro. Add prostat.  7. Spina bifida(neurogenic bladder) 8. Hyperkalemia-chronic issue.   Rita H. Brown NP-C 12/07/2018, 12:05 PM  [employer] [phone]

## 2018-12-08 MED ORDER — FENTANYL 50 MCG/HR TD PT72
1.0000 | MEDICATED_PATCH | TRANSDERMAL | Status: DC
  Administered 2018-12-08: 1 via TRANSDERMAL
  Filled 2018-12-08: qty 1

## 2018-12-08 MED ORDER — LIDOCAINE 5 % EX PTCH
2.0000 | MEDICATED_PATCH | CUTANEOUS | Status: DC
  Administered 2018-12-08 – 2018-12-11 (×4): 2 via TRANSDERMAL
  Filled 2018-12-08 (×4): qty 2

## 2018-12-08 MED ORDER — DARBEPOETIN ALFA 150 MCG/0.3ML IJ SOSY
150.0000 ug | PREFILLED_SYRINGE | INTRAMUSCULAR | Status: DC
  Administered 2018-12-09: 13:00:00 150 ug via INTRAVENOUS
  Filled 2018-12-08: qty 0.3

## 2018-12-08 MED ORDER — SODIUM POLYSTYRENE SULFONATE 15 GM/60ML PO SUSP
15.0000 g | ORAL | Status: DC
  Administered 2018-12-08: 15 g via ORAL

## 2018-12-08 NOTE — Progress Notes (Addendum)
[facility] Progress Note   Subjective: Up in chair, feels miserable. Rating pain 10/10, not sleeping. He is considering nephrectomy. He believes he behaved poorly with urologist and is willing to talk to them again.   Objective Vitals:   12/07/18 0926 12/07/18 1710 12/07/18 1942 12/08/18 0410  BP: 116/61 111/80 (!) 103/53 118/82  Pulse: 72 75 76 77  Resp: 18 18 16 12   Temp: 98.8 F (37.1 C) 98.9 F (37.2 C) 98.8 F (37.1 C) 98.7 F (37.1 C)  TempSrc: Oral Oral Oral Oral  SpO2: 98% 98% 99% 100%  Weight:    122.2 kg  Height:       Physical Exam General: Chronically ill appearing male in NAD Heart: [id number], 2/6 systolic M Lungs: CTAB Abdomen: soft, NT, with perc nephrostomy Extremities: Trace BLE edema Dialysis Access: L Fem TDC Drsg intact.    Additional Objective Labs: Basic Metabolic Panel: Recent Labs  Lab 12/04/18 1827 12/06/18 0916 12/06/18 1711  NA 132* 132* 133*  K 5.8* 6.2* 5.3*  CL 95* 95* 97*  CO2 21* 22 22  GLUCOSE 137* 141* 132*  BUN 70* 94* 73*  CREATININE 10.17* 13.21* 10.67*  CALCIUM 8.6* 8.9 9.1  PHOS 4.7* 6.3* 5.5*   Liver Function Tests: Recent Labs  Lab 12/04/18 1827 12/06/18 0916 12/06/18 1711  ALBUMIN 2.6* 2.7* 2.8*   No results for input(s): LIPASE, AMYLASE in the last 168 hours. CBC: Recent Labs  Lab 12/02/18 0639 12/03/18 0428 12/04/18 0714 12/05/18 0340 12/06/18 0916  WBC 10.4 10.0 10.3 9.4 9.1  HGB 9.0* 8.6* 8.7* 8.7* 8.6*  HCT 27.7* 27.4* 28.1* 28.2* 28.2*  MCV 95.2 96.1 97.2 98.9 100.0  PLT 181 173 187 187 220   Blood Culture    Component Value Date/Time   SDES ABSCESS LEFT NEPHROSTOMY PLACEMENT 11/28/2018 1040   SPECREQUEST Normal 11/28/2018 1040   CULT  11/28/2018 1040    FEW FUSOBACTERIUM NUCLEATUM BETA LACTAMASE NEGATIVE Performed at [facility] [address] [place], [place] [address]    REPTSTATUS 12/03/2018 FINAL 11/28/2018 1040    Cardiac Enzymes: No results for input(s):  CKTOTAL, CKMB, CKMBINDEX, TROPONINI in the last 168 hours. CBG: No results for input(s): GLUCAP in the last 168 hours. Iron Studies: No results for input(s): IRON, TIBC, TRANSFERRIN, FERRITIN in the last 72 hours. @lablastinr3 @ Studies/Results: No results found. Medications: .  ceFAZolin (ANCEF) IV Stopped (12/06/18 1312)  .  ceFAZolin (ANCEF) IV 2 g (12/04/18 1801)   . ALPRAZolam  0.5 mg Oral Once  . calcium acetate  667 mg Oral TID WC  . Chlorhexidine Gluconate Cloth  6 each Topical Q0600  . famotidine  20 mg Oral Q1400  . feeding supplement (NEPRO CARB STEADY)  237 mL Oral BID BM  . feeding supplement (PRO-STAT SUGAR FREE 64)  30 mL Oral BID  . fentaNYL  1 patch Transdermal Q72H  . fentaNYL  1 patch Transdermal Q72H  . levothyroxine  125 mcg Oral QAC breakfast  . LORazepam  1 mg Oral Daily  . multivitamin  1 tablet Oral QHS  . polyethylene glycol  17 g Oral BID  . sodium chloride flush  5 mL Intracatheter Q8H  . sodium polystyrene  15 g Oral Once per day on Sun Sat  . traZODone  100 mg Oral QHS     MWF at [facility] 4:45hr, 350/800, EDW 123kg (getting under), 2K/2.25Ca, TDC, heparin 3700 bolus - Hectoral 48mcg IV q HD - Mircera 13mcg IV q 4 (  last 11/22) - Venofer 100 x 10 ordered  Assessment/Plan: 1.L ureteral calculi/pyelonephritis:With moderate hydronephrosis and calyceal rupture. S/p L percutaneous nephrostromy tube placement by IR on 11/26.  # Cx grew Fusobacterium. Initially onIV Ceftriaxone-> now changed to Cefazolin per ID (6 weeks, followed by PO amoxicillin for another 4 weeks). There was concern as this bacteria is associated with colon malignancies - s/p flex sigwhich was negative.  # Urology recommending L nephrectomy - consideration B nephrectomy with takedown urostomy. [facility] refused transfer. Will need to be arranged as outpatient - pt seems now willing to use local uro group. Discussed again today with Dr. Claudia Desanctis. Appreciate her  assistance and recommendations. He needs to follow up with OP urologist at [facility] with experience in ileostomy.   2. ESRD:Continue HD on MWF schedule -Next HD 12/07. Usual issues with TDC. Doesn't work well on good day, worse without heparin. Resume tight heparin. Pack with cath flo post HD. 3.HTN/volume:BPok, under previousoutpatient EDW.UF as tolerated. 4. Anemia:Hgb8.6, give ESA with HD tomorrow. Will hold IV Fe while on IV antibiotics. 5. Secondary hyperparathyroidism:Corrected calciumhigh, hectorol reducedthen held.Phos at goal. Continue binders at current dose.  6. Nutrition:Alb low, continue Nepro. Add prostat.  7. Spina bifida(neurogenic bladder) 8. Hyperkalemia-chronic issue. On Kayexalate prn on weekends.   Disposition: Plan for DC tomorrow. He is to follow up with urology as OP. Ask MSW to assist with home health to follow as OP.     Tyiesha Brackney H. Kurtis Anastasia NP-C 12/08/2018, 8:11 AM  [employer] [phone]

## 2018-12-08 NOTE — Plan of Care (Signed)
  Problem: Clinical Measurements: Goal: Will remain free from infection Outcome: Progressing   Problem: Coping: Goal: Level of anxiety will decrease Outcome: Progressing   Problem: Elimination: Goal: Will not experience complications related to bowel motility Outcome: Progressing   

## 2018-12-08 NOTE — Progress Notes (Signed)
Subjective: Patient reports pain abdominal/flank pain worsened with BM; still some pain over nephrostomy site  Objective: Vital signs in last 24 hours: Temp:  [98.7 F (37.1 C)-98.9 F (37.2 C)] 98.9 F (37.2 C) (12/06 0909) Pulse Rate:  [75-79] 79 (12/06 0909) Resp:  [12-18] 18 (12/06 0909) BP: (103-118)/(53-82) 117/63 (12/06 0909) SpO2:  [98 %-100 %] 100 % (12/06 0909) Weight:  [122.2 kg] 122.2 kg (12/06 0410)  Intake/Output from previous day: 12/05 0701 - 12/06 0700 In: 720 [P.O.:720] Out: -  Intake/Output this shift: Total I/O In: 300 [P.O.:300] Out: -   Resp: normal excursion, no R/W GI: Nondistended Wound: tender superfically around nephrostomy site. no obvious induration, redness  Review of PO and IV opioid utilization demonstrates decrease over last 36 hours, with no use between 12/5 afternoon and 12/6 a.m.  Lab Results: Recent Labs    12/06/18 0916  WBC 9.1  HGB 8.6*  HCT 28.2*  PLT 220   BMET Recent Labs    12/06/18 0916 12/06/18 1711  NA 132* 133*  K 6.2* 5.3*  CL 95* 97*  CO2 22 22  GLUCOSE 141* 132*  BUN 94* 73*  CREATININE 13.21* 10.67*  CALCIUM 8.9 9.1    Studies/Results: No results found.  Assessment/Plan: Acute on chronic pain associated with pyelonephritis, nephrostomy tube placement  LOS: 12 days  1) step increase in duragesic to 50 mcg; 2) see if lidoderm near nephrostomy improves tenderness; 3) patient concerned he will be discharged without supplemental pain regimen. I have assured him that he will not. He has been discussing his current care with his PCP and reports that Dr. Delaney Meigs (sp?) will assume responsibility when he is outpatient   Bonna Gains 12/08/2018, 12:42 PM

## 2018-12-08 NOTE — Progress Notes (Signed)
Patient complained of not getting the food he was trying to order.Charge Nurse called the kitchen and spoke with them.Found out that patient's food orders was over the limit of his prescribed diet.Spoke with the patient,explained to him that I could not approved what he wanted to, becouse his K and phosphorus level were both elevated.

## 2018-12-09 LAB — CBC
HCT: 26.1 % — ABNORMAL LOW (ref 39.0–52.0)
Hemoglobin: 8 g/dL — ABNORMAL LOW (ref 13.0–17.0)
MCH: 30.9 pg (ref 26.0–34.0)
MCHC: 30.7 g/dL (ref 30.0–36.0)
MCV: 100.8 fL — ABNORMAL HIGH (ref 80.0–100.0)
Platelets: 219 10*3/uL (ref 150–400)
RBC: 2.59 MIL/uL — ABNORMAL LOW (ref 4.22–5.81)
RDW: 16.5 % — ABNORMAL HIGH (ref 11.5–15.5)
WBC: 10.4 10*3/uL (ref 4.0–10.5)
nRBC: 0 % (ref 0.0–0.2)

## 2018-12-09 LAB — RENAL FUNCTION PANEL
Albumin: 2.6 g/dL — ABNORMAL LOW (ref 3.5–5.0)
Albumin: 2.9 g/dL — ABNORMAL LOW (ref 3.5–5.0)
Anion gap: 18 — ABNORMAL HIGH (ref 5–15)
Anion gap: 13 (ref 5–15)
BUN: 125 mg/dL — ABNORMAL HIGH (ref 6–20)
BUN: 86 mg/dL — ABNORMAL HIGH (ref 6–20)
CO2: 21 mmol/L — ABNORMAL LOW (ref 22–32)
CO2: 24 mmol/L (ref 22–32)
Calcium: 8.7 mg/dL — ABNORMAL LOW (ref 8.9–10.3)
Calcium: 8.7 mg/dL — ABNORMAL LOW (ref 8.9–10.3)
Chloride: 94 mmol/L — ABNORMAL LOW (ref 98–111)
Chloride: 94 mmol/L — ABNORMAL LOW (ref 98–111)
Creatinine, Ser: 15.26 mg/dL — ABNORMAL HIGH (ref 0.61–1.24)
Creatinine, Ser: 12.38 mg/dL — ABNORMAL HIGH (ref 0.61–1.24)
GFR calc Af Amer: 4 mL/min — ABNORMAL LOW (ref >60)
GFR calc Af Amer: 5 mL/min — ABNORMAL LOW (ref >60)
GFR calc non Af Amer: 3 mL/min — ABNORMAL LOW (ref >60)
GFR calc non Af Amer: 4 mL/min — ABNORMAL LOW (ref >60)
Glucose, Bld: 95 mg/dL (ref 70–99)
Glucose, Bld: 116 mg/dL — ABNORMAL HIGH (ref 70–99)
Phosphorus: 9.2 mg/dL — ABNORMAL HIGH (ref 2.5–4.6)
Phosphorus: 6.3 mg/dL — ABNORMAL HIGH (ref 2.5–4.6)
Potassium: 5.9 mmol/L — ABNORMAL HIGH (ref 3.5–5.1)
Potassium: 5 mmol/L (ref 3.5–5.1)
Sodium: 133 mmol/L — ABNORMAL LOW (ref 135–145)
Sodium: 131 mmol/L — ABNORMAL LOW (ref 135–145)

## 2018-12-09 MED ORDER — LIDOCAINE HCL (PF) 1 % IJ SOLN: 5.0000 mL | INTRAMUSCULAR | Status: DC | PRN

## 2018-12-09 MED ORDER — HEPARIN SODIUM (PORCINE) 1000 UNIT/ML IJ SOLN
INTRAMUSCULAR | Status: AC
  Administered 2018-12-09: 3700 [IU] via INTRAVENOUS_CENTRAL
  Filled 2018-12-09: qty 4

## 2018-12-09 MED ORDER — LIDOCAINE-PRILOCAINE 2.5-2.5 % EX CREA: 1.0000 "application " | TOPICAL_CREAM | CUTANEOUS | Status: DC | PRN

## 2018-12-09 MED ORDER — PROMETHAZINE HCL 25 MG/ML IJ SOLN
INTRAMUSCULAR | Status: AC
  Filled 2018-12-09: qty 1

## 2018-12-09 MED ORDER — ALTEPLASE 2 MG IJ SOLR
INTRAMUSCULAR | Status: AC
  Administered 2018-12-09: 4 mg
  Filled 2018-12-09: qty 4

## 2018-12-09 MED ORDER — PENTAFLUOROPROP-TETRAFLUOROETH EX AERO: 1.0000 "application " | INHALATION_SPRAY | CUTANEOUS | Status: DC | PRN

## 2018-12-09 MED ORDER — SODIUM CHLORIDE 0.9 % IV SOLN: 100.0000 mL | INTRAVENOUS | Status: DC | PRN

## 2018-12-09 MED ORDER — PROMETHAZINE HCL 25 MG/ML IJ SOLN
12.5000 mg | Freq: Once | INTRAMUSCULAR | Status: AC
  Administered 2018-12-09: 12.5 mg via INTRAVENOUS

## 2018-12-09 MED ORDER — ALTEPLASE 2 MG IJ SOLR
2.0000 mg | Freq: Once | INTRAMUSCULAR | Status: AC | PRN
  Administered 2018-12-09: 13:00:00 4 mg

## 2018-12-09 MED ORDER — HEPARIN SODIUM (PORCINE) 1000 UNIT/ML DIALYSIS
3700.0000 [IU] | Freq: Once | INTRAMUSCULAR | Status: AC
  Administered 2018-12-09: 09:00:00 3700 [IU] via INTRAVENOUS_CENTRAL

## 2018-12-09 MED ORDER — DARBEPOETIN ALFA 150 MCG/0.3ML IJ SOSY
PREFILLED_SYRINGE | INTRAMUSCULAR | Status: AC
  Administered 2018-12-09: 150 ug via INTRAVENOUS
  Filled 2018-12-09: qty 0.3

## 2018-12-09 NOTE — TOC Progression Note (Signed)
Transition of Care [facility]) - Progression Note    Patient Details  Name: [name] MRN: [id number] Date of Birth: 27-Apr-1965  [facility] CM/SW Contact  Sharlet Salina Mila Homer, LCSW Phone Number: 12/09/2018, 4:20 PM  Clinical Narrative:  Patient requested to see CSW. Visited with [name] and he expressed concern regarding being discharged today and indicated that he is not ready for discharge due to the pain he is in. CSW listened and expressed understanding of his concerns and explained that attending MD makes the decision regarding discharge. CSW checked in Epic before going into room and advised patient that there is no d/c order at this time, however CSW could not guarantee patient that he was not discharging today and patient expressed understanding.        Expected Discharge Plan: [place] Barriers to Discharge: Continued Medical Work up  Expected Discharge Plan and Services Expected Discharge Plan: [place]         Expected Discharge Date: 12/01/18                                   Social Determinants of Health (SDOH) Interventions  No SDOH interventions discussed with patient during visit.   Readmission Risk Interventions No flowsheet data found.

## 2018-12-09 NOTE — Progress Notes (Signed)
Based on my conversation with [name] yesterday, I believe he can be discharged with a 50 mcg duragesic patch, and his home Percocet regimen.    Based on his PO and IV usage for breakthrough over the last 36 hours,  his pain control may be imperfect, but improving as his infection subsides, and he begins to return to his normal baseline.   Bonna Gains, MD PhD 12/09/18 2:01 PM

## 2018-12-09 NOTE — Progress Notes (Signed)
[facility] Progress Note   Subjective: seen in HD, pain still uncontrolled. Duragesic patch being titrated per NSurg, is on 50 ug patch now (started on 12.5 ug patch).    Objective Vitals:   12/09/18 0508 12/09/18 0845 12/09/18 0855 12/09/18 0900  BP: (!) 145/80 (!) 113/59 (!) 114/54 (!) 115/57  Pulse: 84 75 78 75  Resp: 16 18  20   Temp: 99.1 F (37.3 C) 99.9 F (37.7 C)    TempSrc: Oral Oral    SpO2: 99% 97%    Weight:  123.4 kg    Height:       Physical Exam General: Chronically ill appearing male in NAD Heart: [id number], 2/6 systolic M Lungs: CTAB Abdomen: soft, NT, with L perc nephrostomy and RLQ urostomy Extremities: Trace BLE edema Dialysis Access: L Fem TDC Drsg intact.    Additional Objective Labs: Basic Metabolic Panel: Recent Labs  Lab 12/04/18 1827 12/06/18 0916 12/06/18 1711  NA 132* 132* 133*  K 5.8* 6.2* 5.3*  CL 95* 95* 97*  CO2 21* 22 22  GLUCOSE 137* 141* 132*  BUN 70* 94* 73*  CREATININE 10.17* 13.21* 10.67*  CALCIUM 8.6* 8.9 9.1  PHOS 4.7* 6.3* 5.5*   Liver Function Tests: Recent Labs  Lab 12/04/18 1827 12/06/18 0916 12/06/18 1711  ALBUMIN 2.6* 2.7* 2.8*   No results for input(s): LIPASE, AMYLASE in the last 168 hours. CBC: Recent Labs  Lab 12/03/18 0428 12/04/18 0714 12/05/18 0340 12/06/18 0916 12/09/18 0859  WBC 10.0 10.3 9.4 9.1 10.4  HGB 8.6* 8.7* 8.7* 8.6* 8.0*  HCT 27.4* 28.1* 28.2* 28.2* 26.1*  MCV 96.1 97.2 98.9 100.0 100.8*  PLT 173 187 187 220 219   Blood Culture    Component Value Date/Time   SDES ABSCESS LEFT NEPHROSTOMY PLACEMENT 11/28/2018 1040   SPECREQUEST Normal 11/28/2018 1040   CULT  11/28/2018 1040    FEW FUSOBACTERIUM NUCLEATUM BETA LACTAMASE NEGATIVE Performed at [facility] [address] [place], [place] [address]    REPTSTATUS 12/03/2018 FINAL 11/28/2018 1040    Cardiac Enzymes: No results for input(s): CKTOTAL, CKMB, CKMBINDEX, TROPONINI in the last 168  hours. CBG: No results for input(s): GLUCAP in the last 168 hours. Iron Studies: No results for input(s): IRON, TIBC, TRANSFERRIN, FERRITIN in the last 72 hours. @lablastinr3 @ Studies/Results: No results found. Medications: . sodium chloride    . sodium chloride    .  ceFAZolin (ANCEF) IV Stopped (12/06/18 1312)  .  ceFAZolin (ANCEF) IV 2 g (12/04/18 1801)   . ALPRAZolam  0.5 mg Oral Once  . calcium acetate  667 mg Oral TID WC  . Chlorhexidine Gluconate Cloth  6 each Topical Q0600  . darbepoetin (ARANESP) injection - DIALYSIS  150 mcg Intravenous Q Mon-HD  . famotidine  20 mg Oral Q1400  . feeding supplement (NEPRO CARB STEADY)  237 mL Oral BID BM  . feeding supplement (PRO-STAT SUGAR FREE 64)  30 mL Oral BID  . fentaNYL  1 patch Transdermal Q72H  . levothyroxine  125 mcg Oral QAC breakfast  . lidocaine  2 patch Transdermal Q24H  . LORazepam  1 mg Oral Daily  . multivitamin  1 tablet Oral QHS  . polyethylene glycol  17 g Oral BID  . sodium chloride flush  5 mL Intracatheter Q8H  . sodium polystyrene  15 g Oral Once per day on Sun Sat  . traZODone  100 mg Oral QHS     MWF [place]   4h  43min  350/800  123kg   2/2.25 bath  TDC R fem  Hep 3700 - Hectoral 105mcg IV q HD - Mircera 7mcg IV q 4 (last 11/22) - Venofer 100 x 10 ordered  Assessment/Plan: 1.L ureteral calculi/pyelonephritis:With moderate hydronephrosis and calyceal rupture. S/p L percutaneous nephrostromy tube placement by IR on 11/26.  # Cx grew Fusobacterium. Initially onIV Ceftriaxone-> now changed to Cefazolin per ID (6 weeks through 01/06/19) , followed by PO amoxicillin for another 4 weeks). Pt is to f/u w/ ID on 12/24/18. There was concern as this bacteria is associated with colon malignancies - s/p flex sigwhich was negative.  # Urology recommending L nephrectomy - consideration B nephrectomy with takedown urostomy. [facility] refused transfer. Will need to be arranged as outpatient - pt  seems now willing to use local uro group. We have discussed with urology, Dr. Claudia Desanctis. Appreciate her assistance and recommendations. He needs to follow up with OP urologist / Dr Claudia Desanctis at [facility] with experience in ileostomy.   2. ESRD:Continue HD on MWF schedule -Next HD 12/07. Usual issues with TDC. Doesn't work well on good day, worse without heparin. Resume tight heparin. Pack with cath flo post HD. 3.HTN/volume:BPok, under previousoutpatient EDW.UF as tolerated. 4. Anemia:Hgb8.6, give ESA with HD tomorrow. Will hold IV Fe while on IV antibiotics. 5. Secondary hyperparathyroidism:Corrected calciumhigh, hectorol reducedthen held.Phos at goal. Continue binders at current dose.  6. Nutrition:Alb low, continue Nepro. Add prostat.  7. Spina bifida(neurogenic bladder) 8. Hyperkalemia-chronic issue. On Kayexalate prn on weekends.  9. Acute /chronic pain: being managed here acutely by Dr Maryjean Ka, appreciate assistance.  Titrating duragesic patch, currently at 50 ug dose w/ IV/ po prn's.  10. Dispo - for dc once pain adequately controlled on topical and po meds    [name], MD 12/09/2018, 9:30 AM

## 2018-12-10 MED ORDER — HYDROMORPHONE HCL 1 MG/ML IJ SOLN
1.0000 mg | INTRAMUSCULAR | Status: DC | PRN
  Administered 2018-12-10 – 2018-12-11 (×5): 1 mg via INTRAVENOUS
  Filled 2018-12-10 (×4): qty 1

## 2018-12-10 MED ORDER — HYDROMORPHONE HCL 1 MG/ML IJ SOLN: 1.0000 mg | INTRAMUSCULAR | Status: DC | PRN

## 2018-12-10 MED ORDER — FENTANYL 75 MCG/HR TD PT72
1.0000 | MEDICATED_PATCH | TRANSDERMAL | Status: DC
  Administered 2018-12-10: 1 via TRANSDERMAL
  Filled 2018-12-10: qty 1

## 2018-12-10 MED ORDER — CALCIUM ACETATE (PHOS BINDER) 667 MG PO CAPS
1334.0000 mg | ORAL_CAPSULE | Freq: Three times a day (TID) | ORAL | Status: DC
  Administered 2018-12-10 – 2018-12-11 (×4): 1334 mg via ORAL
  Filled 2018-12-10 (×4): qty 2

## 2018-12-10 MED ORDER — HYDROMORPHONE HCL 1 MG/ML IJ SOLN
1.0000 mg | Freq: Once | INTRAMUSCULAR | Status: AC
  Administered 2018-12-10: 2 mg via INTRAVENOUS
  Filled 2018-12-10: qty 2

## 2018-12-10 NOTE — Progress Notes (Signed)
[facility] Progress Note   ** Nephrology team assumed care as his primary team as of 12/6 d/t COVID pandemic**  Subjective:  Seen in room - slept better last night. Still in pain - we discussed and ^ fentanyl patch to 54mcg. Ultimately will be in pain until has nephrectomy as outpatient which hasn't been scheduled yet. He thinks that he will feel ok for discharge tomorrow after dialysis. No CP/dyspnea. Had BM this morning.  Objective Vitals:   12/09/18 2100 12/10/18 0418 12/10/18 0942 12/10/18 0942  BP: 104/72 (!) 105/48 (!) 92/55 (!) 92/55  Pulse: 79 78 77 77  Resp: 18 16 18 18   Temp: 98.4 F (36.9 C) 99 F (37.2 C) 99.1 F (37.3 C) 99.1 F (37.3 C)  TempSrc: Oral Oral Oral Oral  SpO2: 98% 98% 100% 100%  Weight:      Height:       Physical Exam General: Chronically ill appearing male in NAD Heart: SRRR; 2/6 systolic murmur Lungs: CTAB Abdomen: soft, NT, with L perc nephrostomy and RLQ urostomy Extremities: Trace BLE edema Dialysis Access: L Fem [facility]  Additional Objective Labs: Basic Metabolic Panel: Recent Labs  Lab 12/06/18 1711 12/09/18 0859 12/09/18 1652  NA 133* 133* 131*  K 5.3* 5.9* 5.0  CL 97* 94* 94*  CO2 22 21* 24  GLUCOSE 132* 95 116*  BUN 73* 125* 86*  CREATININE 10.67* 15.26* 12.38*  CALCIUM 9.1 8.7* 8.7*  PHOS 5.5* 9.2* 6.3*   Liver Function Tests: Recent Labs  Lab 12/06/18 1711 12/09/18 0859 12/09/18 1652  ALBUMIN 2.8* 2.6* 2.9*   CBC: Recent Labs  Lab 12/04/18 0714 12/05/18 0340 12/06/18 0916 12/09/18 0859  WBC 10.3 9.4 9.1 10.4  HGB 8.7* 8.7* 8.6* 8.0*  HCT 28.1* 28.2* 28.2* 26.1*  MCV 97.2 98.9 100.0 100.8*  PLT 187 187 220 219   Blood Culture    Component Value Date/Time   SDES ABSCESS LEFT NEPHROSTOMY PLACEMENT 11/28/2018 1040   SPECREQUEST Normal 11/28/2018 1040   CULT  11/28/2018 1040    FEW FUSOBACTERIUM NUCLEATUM BETA LACTAMASE NEGATIVE Performed at [facility], [place] [address]  [place], [place] [address]    REPTSTATUS 12/03/2018 FINAL 11/28/2018 1040   Medications: .  ceFAZolin (ANCEF) IV Stopped (12/06/18 1312)  .  ceFAZolin (ANCEF) IV 2 g (12/09/18 1302)   . ALPRAZolam  0.5 mg Oral Once  . calcium acetate  667 mg Oral TID WC  . Chlorhexidine Gluconate Cloth  6 each Topical Q0600  . darbepoetin (ARANESP) injection - DIALYSIS  150 mcg Intravenous Q Mon-HD  . famotidine  20 mg Oral Q1400  . feeding supplement (NEPRO CARB STEADY)  237 mL Oral BID BM  . feeding supplement (PRO-STAT SUGAR FREE 64)  30 mL Oral BID  . fentaNYL  1 patch Transdermal Q72H  .  HYDROmorphone (DILAUDID) injection  1-2 mg Intravenous Once  . levothyroxine  125 mcg Oral QAC breakfast  . lidocaine  2 patch Transdermal Q24H  . LORazepam  1 mg Oral Daily  . multivitamin  1 tablet Oral QHS  . polyethylene glycol  17 g Oral BID  . sodium chloride flush  5 mL Intracatheter Q8H  . sodium polystyrene  15 g Oral Once per day on Sun Sat  . traZODone  100 mg Oral QHS    Dialysis Orders: MWF [place]   4h 92min  350/800  123kg   2/2.25 bath  TDC R fem  Hep 3700 - Hectoral 65mcg IV  q HD - Mircera 29mcg IV q 4 (last 11/22) - Venofer 100 x 10 ordered  Assessment/Plan: 1.L ureteral calculi/pyelonephritis:With moderate hydronephrosis and calyceal rupture. S/p L percutaneous nephrostromy tube placement by IR on 11/26.  # Cx grew Fusobacterium. Initially onIV Ceftriaxone-> now changed to Cefazolin per ID for 6 weeks through 01/06/19, followed by PO amoxicillin for another 4 weeks. Pt is to f/u w/ ID on 12/24/18. There was concern as this bacteria is associated with colon malignancies - s/p flex sigwhich was negative.  # Urology recommending L nephrectomy - consideration B nephrectomy with takedown urostomy. [facility] refused transfer. Will need to be arranged as outpatient -  He needs to follow up with OP urologist / Dr Claudia Desanctis at [facility] with experience in ileostomy.   2.  ESRD:Continue HD on MWF schedule - next tomorrow 12/9. Usual issues with TDC. Doesn't work well on good day, worse without heparin. Resume tight heparin. 3.HTN/volume:BPok, under previousoutpatient EDW.UF as tolerated. 4. Anemia:Hgb8, continue Aranesp q Monday. Will hold IV Fe while on IV antibiotics. 5. Secondary hyperparathyroidism:Corrected calciumhigh, hectorol reducedthen held.Phos trending up, ^ Phoslo. 6. Nutrition:Alb low, continue Nepro and prostat. 7. Spina bifida(neurogenic bladder) 8. Hyperkalemia (chronic issue).On Kayexalate prn on weekends.  9. Acute /chronic pain: Being managed here acutely by Dr Maryjean Ka, appreciate assistance. Titrating fentanyl patch - has been at 63mcg - still not controlled, ^ to 74mcg. 10. Dispo: Once pain controlled - planning for 12/9.  [name], PA-C 12/10/2018, 10:58 AM  [facility] Pager: [phone]

## 2018-12-10 NOTE — TOC Progression Note (Signed)
Transition of Care [facility]) - Progression Note    Patient Details  Name: [name] MRN: [id number] Date of Birth: 01-Oct-1965  Transition of Care [facility]) CM/SW Contact  Bartholomew Crews, RN Phone Number: [phone] 12/10/2018, 11:42 AM  Clinical Narrative:    Spoke with patient at the bedside. Discussed need for [facility] services - RN, PT. Patient requesting [facility] stating that he has used them in the past. Referral accepted by [facility]. Patient will need [place] orders for RN, PT with [facility]. Patient states that his sister will pick him up at discharge. Patient states that he may be ready to transition home in 1-2 days. TOC team following for transition needs.    Expected Discharge Plan: [place] Barriers to Discharge: Continued Medical Work up  Expected Discharge Plan and Services Expected Discharge Plan: [place]         Expected Discharge Date: 12/01/18                                     Social Determinants of Health (SDOH) Interventions    Readmission Risk Interventions No flowsheet data found.

## 2018-12-11 LAB — RENAL FUNCTION PANEL
Albumin: 2.5 g/dL — ABNORMAL LOW (ref 3.5–5.0)
Anion gap: 18 — ABNORMAL HIGH (ref 5–15)
BUN: 111 mg/dL — ABNORMAL HIGH (ref 6–20)
CO2: 22 mmol/L (ref 22–32)
Calcium: 8.8 mg/dL — ABNORMAL LOW (ref 8.9–10.3)
Chloride: 93 mmol/L — ABNORMAL LOW (ref 98–111)
Creatinine, Ser: 15.36 mg/dL — ABNORMAL HIGH (ref 0.61–1.24)
GFR calc Af Amer: 4 mL/min — ABNORMAL LOW (ref >60)
GFR calc non Af Amer: 3 mL/min — ABNORMAL LOW (ref >60)
Glucose, Bld: 111 mg/dL — ABNORMAL HIGH (ref 70–99)
Phosphorus: 9.7 mg/dL — ABNORMAL HIGH (ref 2.5–4.6)
Potassium: 6.3 mmol/L (ref 3.5–5.1)
Sodium: 133 mmol/L — ABNORMAL LOW (ref 135–145)

## 2018-12-11 LAB — CBC WITH DIFFERENTIAL/PLATELET
Abs Immature Granulocytes: 0.08 10*3/uL — ABNORMAL HIGH (ref 0.00–0.07)
Basophils Absolute: 0 10*3/uL (ref 0.0–0.1)
Basophils Relative: 1 %
Eosinophils Absolute: 0.3 10*3/uL (ref 0.0–0.5)
Eosinophils Relative: 3 %
HCT: 26.8 % — ABNORMAL LOW (ref 39.0–52.0)
Hemoglobin: 8.2 g/dL — ABNORMAL LOW (ref 13.0–17.0)
Immature Granulocytes: 1 %
Lymphocytes Relative: 10 %
Lymphs Abs: 0.9 10*3/uL (ref 0.7–4.0)
MCH: 31.2 pg (ref 26.0–34.0)
MCHC: 30.6 g/dL (ref 30.0–36.0)
MCV: 101.9 fL — ABNORMAL HIGH (ref 80.0–100.0)
Monocytes Absolute: 0.4 10*3/uL (ref 0.1–1.0)
Monocytes Relative: 5 %
Neutro Abs: 6.9 10*3/uL (ref 1.7–7.7)
Neutrophils Relative %: 80 %
Platelets: 189 10*3/uL (ref 150–400)
RBC: 2.63 MIL/uL — ABNORMAL LOW (ref 4.22–5.81)
RDW: 16.8 % — ABNORMAL HIGH (ref 11.5–15.5)
WBC: 8.6 10*3/uL (ref 4.0–10.5)
nRBC: 0 % (ref 0.0–0.2)

## 2018-12-11 MED ORDER — FENTANYL 75 MCG/HR TD PT72: 1.0000 | MEDICATED_PATCH | TRANSDERMAL | 0 refills | Status: AC

## 2018-12-11 MED ORDER — HYDROMORPHONE HCL 1 MG/ML IJ SOLN
INTRAMUSCULAR | Status: AC
  Filled 2018-12-11: qty 1

## 2018-12-11 MED ORDER — PROMETHAZINE HCL 25 MG/ML IJ SOLN
INTRAMUSCULAR | Status: AC
  Administered 2018-12-11: 25 mg via INTRAVENOUS_CENTRAL
  Filled 2018-12-11: qty 1

## 2018-12-11 MED ORDER — HEPARIN SODIUM (PORCINE) 1000 UNIT/ML IJ SOLN
INTRAMUSCULAR | Status: AC
  Administered 2018-12-11: 6000 [IU] via INTRAVENOUS_CENTRAL
  Filled 2018-12-11: qty 12

## 2018-12-11 MED ORDER — HEPARIN SODIUM (PORCINE) 1000 UNIT/ML DIALYSIS
20.0000 [IU]/kg | INTRAMUSCULAR | Status: DC | PRN
  Administered 2018-12-11: 1000 [IU] via INTRAVENOUS_CENTRAL
  Filled 2018-12-11: qty 2.5

## 2018-12-11 NOTE — Discharge Summary (Signed)
Physician Discharge Summary  Patient ID: Carloseduardo Haggart MRN: [id number] DOB/AGE: 1965/11/02 53 y.o.  Admit date: 11/25/2018 Discharge date: 12/11/2018  Admission Diagnoses:   Hydronephrosis with renal and ureteral calculus obstruction  Discharge Diagnoses:  Principal Problem:   Flank pain Active Problems:   ESRD (end stage renal disease) on dialysis ([place])   Nephrolithiasis   Hydronephrosis with renal and ureteral calculus obstruction   Left flank pain   Abnormal CT scan, gastrointestinal tract   Discharged Condition: fair  Hospital Course:   Mr. Mccusker was admitted with severe L flank pain for 2 weeks.   1. L ureteral calculi/pyelonephritis with hydronephrosis and calyceal rupture   - Abdominal CT showed "moderate L hydroureteronephrosis and extensive left perinephric stranding and trace free fluid. Punctate 2 mm calcification seen in the proximal left ureter (3/50) and additional radiodensity in the distal left ureter (3/59) which takes a markedly medialized course almost crossing midline prior to proceeding inferiorly to the decompressed urinary bladder. Free fluid about the kidney and proximal ureter may reflect a calyceal rupture."    - Urology consulted and he underwent L percutaneous nephrostomy tube by IR on 11/26. Fluid cultured and grew Fusobacterium.  Initially onIV Ceftriaxone-> now changed to Cefazolin per ID for 6 weeksthrough 01/06/19, followed by PO amoxicillin for another 4 weeks.Pt is to f/u w/ ID on 12/24/18.There was concern as this bacteria is associated with colon malignancies - GI consulted, underwent flex sigwhich was negative.   - Urology recommending L nephrectomy - consideration B nephrectomy with takedown urostomy. [facility] refused transfer. Will need to be arranged as outpatient -  He needs to follow up with OP urologist/ Dr Sumner [facility] with experience in ileostomy.    - Pain control was his largest issue during  admission. Dr. Maryjean Ka consulted for pain control, started on Fentanyl patch and up-titrated dose to 50mcg. Also using prn percocet he gets from his PCP.   2. ESRD  - Dialyzes on normal schedule. Had ongoing [place] issues without heparin, ended up being resumed. 3.HTN/volume  - BPok, under previousoutpatient EDW.Will be lowered per renal team. 4. Anemia of ESRD  - Aranesp continued throughout hospitalization 5. Secondary hyperparathyroidism  - Corrected calciumhigh, hectorol held. 6. Hypoalbuminemia  - Continued Nepro and prostat. 7. Spina bifida(neurogenic bladder) 8. Hyperkalemia (chronic issue).On Kayexalate prn on weekends.  Consults:  Nephrology Urology GI  Treatments: L nephrostomy drain Dialysis Flexible sigmoidoscopy   Discharge Exam: Blood pressure (!) 91/41, pulse 62, temperature 97.6 F (36.4 C), temperature source Oral, resp. rate 15, height 5\' 11"  (1.803 m), weight 122.8 kg, SpO2 98 %.  General: Chronically ill appearing male in NAD Heart: RRR; 2/6 systolic murmur Lungs: CTAB Abdomen: soft, NT, withLperc nephrostomy and RLQ urostomy Extremities: Trace BLE edema Dialysis Access: L Fem TDC  Disposition: Discharge disposition: 01-Home or Self Care       Discharge Instructions    Call MD for:  persistant dizziness or light-headedness   Complete by: As directed    Call MD for:  persistant nausea and vomiting   Complete by: As directed    Call MD for:  redness, tenderness, or signs of infection (pain, swelling, redness, odor or green/yellow discharge around incision site)   Complete by: As directed    Call MD for:  temperature >100.4   Complete by: As directed    Diet - low sodium heart healthy   Complete by: As directed    Discharge instructions   Complete by: As directed  It has been a pleasure taking care of you! You were hospitalized with left flank pain due to kidney stone and urinary blockage on the outside.  There is also concern  about infection of left kidney.  You had nephrostomy tube placed.  You were treated with antibiotics for infection.  We are discharging you more antibiotics to complete treatment course.  We may have made some adjustment to your home medication during this hospitalization. Please review your new medication list and the directions before you take your medications.  You may resume your Plavix and aspirin (low-dose) once your urine clears.   Please follow-up with your primary care doctor and urologist in 1 to 2 weeks.   Take care,   Increase activity slowly   Complete by: As directed      Allergies as of 12/11/2018      Reactions   Hydrocodone Other (See Comments)   Caused involuntary movement and twitching. CANNOT TAKE DUE TO MUSCLE SPASMS AND MUSCLE TREMORS   Furadantin [nitrofurantoin] Other (See Comments)   UNSPECIFIED REACTION    Mandelamine [methenamine] Other (See Comments)   UNSPECIFIED REACTION    Noroxin [norfloxacin] Other (See Comments)   UNSPECIFIED REACTION    [place] Nausea Only   Contrast Media [iodinated Diagnostic Agents] Nausea And Vomiting   Oral dye causes vomiting, IV dye is okay   Metrizamide Nausea And Vomiting   Oral dye causes vomiting, IV dye is okay   Sulfa Antibiotics Other (See Comments), Cough   Childhood reaction - pt could not confirm that it was a cough   Sulfur Cough   Childhood reaction - pt could not confirm that it was a cough      Medication List    STOP taking these medications   midodrine 5 MG tablet Commonly known as: PROAMATINE     TAKE these medications   aspirin EC 81 MG tablet Take 1 tablet (81 mg total) by mouth daily. What changed:   medication strength  how much to take   calcium acetate 667 MG capsule Commonly known as: PHOSLO Take 3-4 capsules (2,001-2,668 mg total) by mouth See admin instructions. Take 2708 mg by mouth 3 times daily with meals, take 2001 mg by mouth with snacks What changed: additional instructions    clopidogrel 75 MG tablet Commonly known as: PLAVIX Take 1 tablet (75 mg total) by mouth daily. What changed: when to take this   famotidine 40 MG tablet Commonly known as: PEPCID Take 1 tablet (40 mg total) by mouth daily at 2 PM.   fentaNYL 75 MCG/HR Commonly known as: [place] 1 patch onto the skin every 3 (three) days for 7 days. Start taking on: December 13, 2018   fluticasone 50 MCG/ACT nasal spray Commonly known as: FLONASE Place 2 sprays into both nostrils as needed for allergies or rhinitis (congestion). What changed: reasons to take this   levothyroxine 125 MCG tablet Commonly known as: SYNTHROID Take 1 tablet (125 mcg total) by mouth daily before breakfast.   LORazepam 1 MG tablet Commonly known as: ATIVAN Take 1 mg by mouth daily.   methocarbamol 500 MG tablet Commonly known as: ROBAXIN Take 1 tablet (500 mg total) by mouth every 6 (six) hours as needed for muscle spasms.   NEPRO PO Take 1 Can by mouth 2 (two) times daily.   oxyCODONE-acetaminophen 10-325 MG tablet Commonly known as: PERCOCET Take 1 tablet by mouth every 6 (six) hours as needed for pain.   Polyethylene Glycol 3350  Powd Take 17 g by mouth daily as needed. [place] 1 CAPFUL (17GM) IN 4-8OZ. OF WATER AND DRINK BY MOUTH DAILY AS NEEDED FOR MILD CONSTIPATION   promethazine 25 MG tablet Commonly known as: PHENERGAN Take 1 tablet (25 mg total) by mouth 2 (two) times daily as needed for nausea or vomiting.   sodium polystyrene powder Commonly known as: KAYEXALATE Take 15 g by mouth See admin instructions. Take 15 g (mixed in water) by mouth on Saturdays and Sundays   sodium zirconium cyclosilicate 10 g Pack packet Commonly known as: Lokelma Take 10 g by mouth daily. TAKE 10GM PACK ON OFF DIALYSIS DAYS ON TUESDAY ON THURSDAY ON SUNDAY   traZODone 100 MG tablet Commonly known as: DESYREL Take 1 tablet (100 mg total) by mouth at bedtime.   zolpidem 10 MG tablet Commonly known as:  AMBIEN Take 1 tablet (10 mg total) by mouth at bedtime.     ASK your doctor about these medications   cephALEXin 500 MG capsule Commonly known as: KEFLEX Take 1 capsule (500 mg total) by mouth 2 (two) times daily for 10 doses. Take after dialysis on dialysis days. Ask about: Should I take this medication?      Follow-up Information    Carlyle Basques, MD. Go on 12/24/2018.   Specialty: Infectious Diseases Why: Appointment is at 3:15 PM on Tuesday December 24, 2018. Please arrive 15 minutes in advance of appointment to complete new patient paperwork for clinic. Contact information: [place] [address] [phone]        [facility] Follow up.   Specialty: Home Health Services Contact information: [place] [phone]        Robley Fries, MD. Call.   Specialty: Urology Why: Call to make appt wtih Dr. Claudia Desanctis the urologist you saw in the hospital, call as soon as you get home Contact information: [address] [place] [address] [phone]           Signed: [name] 12/11/2018, 2:16 PM

## 2018-12-12 ENCOUNTER — Telehealth: Payer: Self-pay | Admitting: Nephrology

## 2018-12-12 DIAGNOSIS — N110 Nonobstructive reflux-associated chronic pyelonephritis: Secondary | ICD-10-CM | POA: Insufficient documentation

## 2018-12-12 DIAGNOSIS — R1084 Generalized abdominal pain: Secondary | ICD-10-CM | POA: Insufficient documentation

## 2018-12-12 NOTE — Telephone Encounter (Signed)
Transition of care contact from inpatient facility  Date of discharge: 12/11/2018 Date of contact:12/12/2018 Method: Phone Spoke to: Patient and sister Tressia Miners)  Patient contacted to discuss transition of care from recent inpatient hospitalization. Patient was admitted to [facility] from 11/25/2018 to 12/11/2018 with discharge diagnosis of L renal calculi with hydronephrosis/pyelonephritis.  Medication changes were reviewed. Says that his Fentanyl patch was accidentally sent to the wrong pharmacy, but his sister was able to pick up anyways.  Patient will follow up with his/her outpatient HD unit on: Fri 12/11  After speaking with patient - he put his sister Tressia Miners) on the phone who went on to yell for 10 minutes about the sub-par care that was given to her brother during his admission and that he was discharged home in severe pain and without immediate plan for L nephrectomy. Attempted to explain the issues which resulted in this - namely the patient "firing" the inpatient urology team, but she said that she plans to take him to another hospital "that will actually take care of him." She then hung up the phone.  Called his outpatient HD unit and spoke with the social worker, Tess. If he does come for dialysis tomorrow, she will speak with him to assist in what they need at home, f/u appts, etc.  [name], PA-C [employer] Pager [phone]

## 2018-12-24 ENCOUNTER — Inpatient Hospital Stay: Payer: Medicare Other | Admitting: Internal Medicine

## 2019-01-29 ENCOUNTER — Inpatient Hospital Stay: Admit: 2019-01-29 | Payer: Medicare Other | Admitting: Urology

## 2019-01-29 SURGERY — NEPHROLITHOTOMY PERCUTANEOUS
Anesthesia: General | Laterality: Left

## 2019-02-11 ENCOUNTER — Other Ambulatory Visit: Payer: Self-pay | Admitting: Urology

## 2019-02-28 ENCOUNTER — Other Ambulatory Visit: Payer: Self-pay | Admitting: Urology

## 2019-03-06 ENCOUNTER — Other Ambulatory Visit: Payer: Self-pay | Admitting: Urology

## 2019-03-17 NOTE — Progress Notes (Signed)
DUE TO COVID-19 ONLY ONE VISITOR IS ALLOWED TO COME WITH YOU AND STAY IN THE WAITING ROOM ONLY DURING PRE OP AND PROCEDURE DAY OF SURGERY. THE 1 VISITOR MAY VISIT WITH YOU AFTER SURGERY IN YOUR PRIVATE ROOM DURING VISITING HOURS ONLY!  YOU NEED TO HAVE A COVID 19 TEST ON___3/20/21____ @_______ , THIS TEST MUST BE DONE BEFORE SURGERY, COME  [place] , [id number].  ([place]) ONCE YOUR COVID TEST IS COMPLETED, PLEASE BEGIN THE QUARANTINE INSTRUCTIONS AS OUTLINED IN YOUR HANDOUT.                Amish Mintzer  03/17/2019   Your procedure is scheduled on:  03/26/19   Report to [facility] Main  Entrance   Report to admitting at     1030 AM     Call this number if you have problems the morning of surgery [phone]    Remember: Do not eat food or drink liquids :After Midnight. BRUSH YOUR TEETH MORNING OF SURGERY AND RINSE YOUR MOUTH OUT, NO CHEWING GUM CANDY OR MINTS.     Take these medicines the morning of surgery with A SIP OF WATER:  Pepcid, Flonase if needed, Synthroid                                  You may not have any metal on your body including hair pins and              piercings  Do not wear jewelry, , lotions, powders or perfumes, deodorant                          Men may shave face and neck.   Do not bring valuables to the hospital. [place].  Contacts, dentures or bridgework may not be worn into surgery.  Leave suitcase in the car. After surgery it may be brought to your room.     Patients discharged the day of surgery will not be allowed to drive home. IF YOU ARE HAVING SURGERY AND GOING HOME THE SAME DAY, YOU MUST HAVE AN ADULT TO DRIVE YOU HOME AND BE WITH YOU FOR 24 HOURS. YOU MAY GO HOME BY TAXI OR UBER OR ORTHERWISE, BUT AN ADULT MUST ACCOMPANY YOU HOME AND STAY WITH YOU FOR 24 HOURS.  Name and phone number of your driver:  Special Instructions: N/A   Please read over the following fact sheets you were given: _____________________________________________________________________             [facility] - Preparing for Surgery Before surgery, you can play an important role.  Because skin is not sterile, your skin needs to be as free of germs as possible.  You can reduce the number of germs on your skin by washing with CHG (chlorahexidine gluconate) soap before surgery.  CHG is an antiseptic cleaner which kills germs and bonds with the skin to continue killing germs even after washing. Please DO NOT use if you have an allergy to CHG or antibacterial soaps.  If your skin becomes reddened/irritated stop using the CHG and inform your nurse when you arrive at Short Stay. Do not shave (including legs and underarms) for at least 48 hours prior to the first CHG shower.  You may  shave your face/neck. Please follow these instructions carefully:  1.  Shower with CHG Soap the night before surgery and the  morning of Surgery.  2.  If you choose to wash your hair, wash your hair first as usual with your  normal  shampoo.  3.  After you shampoo, rinse your hair and body thoroughly to remove the  shampoo.                           4.  Use CHG as you would any other liquid soap.  You can apply chg directly  to the skin and wash                       Gently with a scrungie or clean washcloth.  5.  Apply the CHG Soap to your body ONLY FROM THE NECK DOWN.   Do not use on face/ open                           Wound or open sores. Avoid contact with eyes, ears mouth and genitals (private parts).                       Wash face,  Genitals (private parts) with your normal soap.             6.  Wash thoroughly, paying special attention to the area where your surgery  will be performed.  7.  Thoroughly rinse your body with warm water from the neck down.  8.  DO NOT shower/wash with your normal soap after using and rinsing off  the CHG Soap.                9.  Pat  yourself dry with a clean towel.            10.  Wear clean pajamas.            11.  Place clean sheets on your bed the night of your first shower and do not  sleep with pets. Day of Surgery : Do not apply any lotions/deodorants the morning of surgery.  Please wear clean clothes to the hospital/surgery center.  FAILURE TO FOLLOW THESE INSTRUCTIONS MAY RESULT IN THE CANCELLATION OF YOUR SURGERY PATIENT SIGNATURE_________________________________  NURSE SIGNATURE__________________________________  ________________________________________________________________________

## 2019-03-18 ENCOUNTER — Encounter (HOSPITAL_COMMUNITY): Payer: Self-pay

## 2019-03-18 ENCOUNTER — Encounter (HOSPITAL_COMMUNITY)
Admission: RE | Admit: 2019-03-18 | Discharge: 2019-03-18 | Disposition: A | Discharge status: Home / Self Care | Payer: Medicare Other | Source: Ambulatory Visit | Attending: Urology | Admitting: Urology

## 2019-03-18 ENCOUNTER — Other Ambulatory Visit: Payer: Self-pay

## 2019-03-18 DIAGNOSIS — Z01818 Encounter for other preprocedural examination: Secondary | ICD-10-CM | POA: Insufficient documentation

## 2019-03-18 DIAGNOSIS — T83098A Other mechanical complication of other indwelling urethral catheter, initial encounter: Secondary | ICD-10-CM | POA: Diagnosis not present

## 2019-03-18 NOTE — Progress Notes (Signed)
PCP - [name]  Cardiologist -   Chest x-ray -  EKG - 12-07-17  Stress Test -  ECHO - 2019 Cardiac Cath -   Sleep Study -  CPAP -   Fasting Blood Sugar -  Checks Blood Sugar _____ times a day  Blood Thinner Instructions:Plavix  Aspirin Instructions:  Instructed to stay on asa per Dr. Tresa  Last Dose: 3-18  Anesthesia review: Labs and pcr screen to be done DOS pt. Dialysis M-W-F  Patient denies shortness of breath, fever, cough and chest pain at PAT appointment   NONE   Patient verbalized understanding of instructions that were given to them at the PAT appointment. Patient was also instructed that they will need to review over the PAT instructions again at home before surgery.

## 2019-03-18 NOTE — Patient Instructions (Signed)
DUE TO COVID-19 ONLY ONE VISITOR IS ALLOWED TO COME WITH YOU AND STAY IN THE WAITING ROOM ONLY DURING PRE OP AND PROCEDURE DAY OF SURGERY. THE 1 VISITOR MAY VISIT WITH YOU AFTER SURGERY IN YOUR PRIVATE ROOM DURING VISITING HOURS ONLY!  YOU NEED TO HAVE A COVID 19 TEST ON___3/22/21__ @_______ , THIS TEST MUST BE DONE BEFORE SURGERY, COME  [name] , [id number].  ([place]) ONCE YOUR COVID TEST IS COMPLETED, PLEASE BEGIN THE QUARANTINE INSTRUCTIONS AS OUTLINED IN YOUR HANDOUT.                [name]  03/18/2019   Your procedure is scheduled on: 03-26-19   Report to [facility] Main  Entrance   Report to admitting at     1030 AM     Call this number if you have problems the morning of surgery [phone]    Remember: Do not eat food or drink liquids :After Midnight. BRUSH YOUR TEETH MORNING OF SURGERY AND RINSE YOUR MOUTH OUT, NO CHEWING GUM CANDY OR MINTS.     Take these medicines the morning of surgery with A SIP OF WATER:  Pepcid, Flonase if needed, Synthroid                                  You may not have any metal on your body including hair pins and              piercings  Do not wear jewelry, , lotions, powders or perfumes, deodorant                          Men may shave face and neck.   Do not bring valuables to the hospital. [place].  Contacts, dentures or bridgework may not be worn into surgery.  Leave suitcase in the car. After surgery it may be brought to your room.     Patients discharged the day of surgery will not be allowed to drive home. IF YOU ARE HAVING SURGERY AND GOING HOME THE SAME DAY, YOU MUST HAVE AN ADULT TO DRIVE YOU HOME AND BE WITH YOU FOR 24 HOURS. YOU MAY GO HOME BY TAXI OR UBER OR ORTHERWISE, BUT AN ADULT MUST ACCOMPANY YOU HOME AND STAY WITH YOU FOR 24 HOURS.  Name and phone number of your driver:  Special Instructions: N/A   Please read over the following fact sheets you were given: _____________________________________________________________________             [facility] - Preparing for Surgery Before surgery, you can play an important role.  Because skin is not sterile, your skin needs to be as free of germs as possible.  You can reduce the number of germs on your skin by washing with CHG (chlorahexidine gluconate) soap before surgery.  CHG is an antiseptic cleaner which kills germs and bonds with the skin to continue killing germs even after washing. Please DO NOT use if you have an allergy to CHG or antibacterial soaps.  If your skin becomes reddened/irritated stop using the CHG and inform your nurse when you arrive at Short Stay. Do not shave (including legs and underarms) for at least 48 hours prior to the first CHG shower.  You may shave  your face/neck. Please follow these instructions carefully:  1.  Shower with CHG Soap the night before surgery and the  morning of Surgery.  2.  If you choose to wash your hair, wash your hair first as usual with your  normal  shampoo.  3.  After you shampoo, rinse your hair and body thoroughly to remove the  shampoo.                           4.  Use CHG as you would any other liquid soap.  You can apply chg directly  to the skin and wash                       Gently with a scrungie or clean washcloth.  5.  Apply the CHG Soap to your body ONLY FROM THE NECK DOWN.   Do not use on face/ open                           Wound or open sores. Avoid contact with eyes, ears mouth and genitals (private parts).                       Wash face,  Genitals (private parts) with your normal soap.             6.  Wash thoroughly, paying special attention to the area where your surgery  will be performed.  7.  Thoroughly rinse your body with warm water from the neck down.  8.  DO NOT shower/wash with your normal soap after using and rinsing off  the CHG Soap.                9.  Pat  yourself dry with a clean towel.            10.  Wear clean pajamas.            11.  Place clean sheets on your bed the night of your first shower and do not  sleep with pets. Day of Surgery : Do not apply any lotions/deodorants the morning of surgery.  Please wear clean clothes to the hospital/surgery center.  FAILURE TO FOLLOW THESE INSTRUCTIONS MAY RESULT IN THE CANCELLATION OF YOUR SURGERY PATIENT SIGNATURE_________________________________  NURSE SIGNATURE__________________________________  ________________________________________________________________________

## 2019-03-18 NOTE — Progress Notes (Signed)
Pt. Instructions reviewed in detail over phone pt. Lab work to be done [facility] due to Dialysis pt. Covid test to be done 3-22 due to transportation issues

## 2019-03-20 ENCOUNTER — Other Ambulatory Visit: Payer: Self-pay

## 2019-03-20 ENCOUNTER — Inpatient Hospital Stay (HOSPITAL_COMMUNITY)
Admission: EM | Admit: 2019-03-20 | Discharge: 2019-03-24 | DRG: 698 | Disposition: A | Discharge status: Home / Self Care | Payer: Medicare Other | Attending: Family Medicine | Admitting: Family Medicine

## 2019-03-20 ENCOUNTER — Encounter (HOSPITAL_COMMUNITY): Payer: Self-pay | Admitting: Pharmacy Technician

## 2019-03-20 ENCOUNTER — Emergency Department (HOSPITAL_COMMUNITY): Payer: Medicare Other

## 2019-03-20 DIAGNOSIS — K219 Gastro-esophageal reflux disease without esophagitis: Secondary | ICD-10-CM | POA: Diagnosis present

## 2019-03-20 DIAGNOSIS — N132 Hydronephrosis with renal and ureteral calculous obstruction: Secondary | ICD-10-CM | POA: Diagnosis present

## 2019-03-20 DIAGNOSIS — K5900 Constipation, unspecified: Secondary | ICD-10-CM | POA: Diagnosis present

## 2019-03-20 DIAGNOSIS — N186 End stage renal disease: Secondary | ICD-10-CM | POA: Diagnosis present

## 2019-03-20 DIAGNOSIS — T82898A Other specified complication of vascular prosthetic devices, implants and grafts, initial encounter: Secondary | ICD-10-CM

## 2019-03-20 DIAGNOSIS — G4700 Insomnia, unspecified: Secondary | ICD-10-CM | POA: Diagnosis present

## 2019-03-20 DIAGNOSIS — Q059 Spina bifida, unspecified: Secondary | ICD-10-CM | POA: Diagnosis not present

## 2019-03-20 DIAGNOSIS — Z882 Allergy status to sulfonamides status: Secondary | ICD-10-CM

## 2019-03-20 DIAGNOSIS — Z888 Allergy status to other drugs, medicaments and biological substances status: Secondary | ICD-10-CM | POA: Diagnosis not present

## 2019-03-20 DIAGNOSIS — R5383 Other fatigue: Secondary | ICD-10-CM | POA: Diagnosis present

## 2019-03-20 DIAGNOSIS — Z833 Family history of diabetes mellitus: Secondary | ICD-10-CM

## 2019-03-20 DIAGNOSIS — G43909 Migraine, unspecified, not intractable, without status migrainosus: Secondary | ICD-10-CM | POA: Diagnosis present

## 2019-03-20 DIAGNOSIS — Z7902 Long term (current) use of antithrombotics/antiplatelets: Secondary | ICD-10-CM

## 2019-03-20 DIAGNOSIS — G40409 Other generalized epilepsy and epileptic syndromes, not intractable, without status epilepticus: Secondary | ICD-10-CM | POA: Diagnosis present

## 2019-03-20 DIAGNOSIS — Z885 Allergy status to narcotic agent status: Secondary | ICD-10-CM

## 2019-03-20 DIAGNOSIS — E039 Hypothyroidism, unspecified: Secondary | ICD-10-CM | POA: Diagnosis present

## 2019-03-20 DIAGNOSIS — I120 Hypertensive chronic kidney disease with stage 5 chronic kidney disease or end stage renal disease: Secondary | ICD-10-CM | POA: Diagnosis present

## 2019-03-20 DIAGNOSIS — R110 Nausea: Secondary | ICD-10-CM | POA: Diagnosis present

## 2019-03-20 DIAGNOSIS — Z20822 Contact with and (suspected) exposure to covid-19: Secondary | ICD-10-CM | POA: Diagnosis present

## 2019-03-20 DIAGNOSIS — T83098A Other mechanical complication of other indwelling urethral catheter, initial encounter: Principal | ICD-10-CM

## 2019-03-20 DIAGNOSIS — Z89412 Acquired absence of left great toe: Secondary | ICD-10-CM

## 2019-03-20 DIAGNOSIS — Z8249 Family history of ischemic heart disease and other diseases of the circulatory system: Secondary | ICD-10-CM

## 2019-03-20 DIAGNOSIS — Z91041 Radiographic dye allergy status: Secondary | ICD-10-CM

## 2019-03-20 DIAGNOSIS — N201 Calculus of ureter: Secondary | ICD-10-CM

## 2019-03-20 DIAGNOSIS — Y732 Prosthetic and other implants, materials and accessory gastroenterology and urology devices associated with adverse incidents: Secondary | ICD-10-CM | POA: Diagnosis present

## 2019-03-20 DIAGNOSIS — Z992 Dependence on renal dialysis: Secondary | ICD-10-CM | POA: Diagnosis not present

## 2019-03-20 DIAGNOSIS — Z9109 Other allergy status, other than to drugs and biological substances: Secondary | ICD-10-CM | POA: Diagnosis not present

## 2019-03-20 DIAGNOSIS — D631 Anemia in chronic kidney disease: Secondary | ICD-10-CM | POA: Diagnosis present

## 2019-03-20 DIAGNOSIS — I739 Peripheral vascular disease, unspecified: Secondary | ICD-10-CM | POA: Diagnosis present

## 2019-03-20 DIAGNOSIS — F419 Anxiety disorder, unspecified: Secondary | ICD-10-CM | POA: Diagnosis present

## 2019-03-20 DIAGNOSIS — N99528 Other complication of other external stoma of urinary tract: Secondary | ICD-10-CM | POA: Diagnosis present

## 2019-03-20 DIAGNOSIS — N2581 Secondary hyperparathyroidism of renal origin: Secondary | ICD-10-CM | POA: Diagnosis present

## 2019-03-20 DIAGNOSIS — Z79890 Hormone replacement therapy (postmenopausal): Secondary | ICD-10-CM

## 2019-03-20 DIAGNOSIS — Z818 Family history of other mental and behavioral disorders: Secondary | ICD-10-CM

## 2019-03-20 DIAGNOSIS — R34 Anuria and oliguria: Secondary | ICD-10-CM | POA: Diagnosis present

## 2019-03-20 LAB — CBC WITH DIFFERENTIAL/PLATELET
Abs Immature Granulocytes: 0.03 10*3/uL (ref 0.00–0.07)
Basophils Absolute: 0 10*3/uL (ref 0.0–0.1)
Basophils Relative: 1 %
Eosinophils Absolute: 0.1 10*3/uL (ref 0.0–0.5)
Eosinophils Relative: 2 %
HCT: 37.6 % — ABNORMAL LOW (ref 39.0–52.0)
Hemoglobin: 11.3 g/dL — ABNORMAL LOW (ref 13.0–17.0)
Immature Granulocytes: 1 %
Lymphocytes Relative: 7 %
Lymphs Abs: 0.4 10*3/uL — ABNORMAL LOW (ref 0.7–4.0)
MCH: 29.4 pg (ref 26.0–34.0)
MCHC: 30.1 g/dL (ref 30.0–36.0)
MCV: 97.9 fL (ref 80.0–100.0)
Monocytes Absolute: 0.5 10*3/uL (ref 0.1–1.0)
Monocytes Relative: 9 %
Neutro Abs: 4.4 10*3/uL (ref 1.7–7.7)
Neutrophils Relative %: 80 %
Platelets: 150 10*3/uL (ref 150–400)
RBC: 3.84 MIL/uL — ABNORMAL LOW (ref 4.22–5.81)
RDW: 14.6 % (ref 11.5–15.5)
WBC: 5.4 10*3/uL (ref 4.0–10.5)
nRBC: 0 % (ref 0.0–0.2)

## 2019-03-20 LAB — COMPREHENSIVE METABOLIC PANEL
ALT: 9 U/L (ref 0–44)
AST: 13 U/L — ABNORMAL LOW (ref 15–41)
Albumin: 3.4 g/dL — ABNORMAL LOW (ref 3.5–5.0)
Alkaline Phosphatase: 79 U/L (ref 38–126)
Anion gap: 17 — ABNORMAL HIGH (ref 5–15)
BUN: 36 mg/dL — ABNORMAL HIGH (ref 6–20)
CO2: 26 mmol/L (ref 22–32)
Calcium: 9.3 mg/dL (ref 8.9–10.3)
Chloride: 95 mmol/L — ABNORMAL LOW (ref 98–111)
Creatinine, Ser: 6.84 mg/dL — ABNORMAL HIGH (ref 0.61–1.24)
GFR calc Af Amer: 10 mL/min — ABNORMAL LOW (ref >60)
GFR calc non Af Amer: 8 mL/min — ABNORMAL LOW (ref >60)
Glucose, Bld: 94 mg/dL (ref 70–99)
Potassium: 4 mmol/L (ref 3.5–5.1)
Sodium: 138 mmol/L (ref 135–145)
Total Bilirubin: 0.6 mg/dL (ref 0.3–1.2)
Total Protein: 8.6 g/dL — ABNORMAL HIGH (ref 6.5–8.1)

## 2019-03-20 MED ORDER — ZOLPIDEM TARTRATE 5 MG PO TABS
5.0000 mg | ORAL_TABLET | Freq: Every evening | ORAL | Status: DC | PRN
  Administered 2019-03-21 – 2019-03-23 (×3): 5 mg via ORAL
  Filled 2019-03-20 (×3): qty 1

## 2019-03-20 MED ORDER — FENTANYL CITRATE (PF) 100 MCG/2ML IJ SOLN
25.0000 ug | Freq: Once | INTRAMUSCULAR | Status: AC
  Administered 2019-03-20: 25 ug via INTRAVENOUS
  Filled 2019-03-20: qty 2

## 2019-03-20 MED ORDER — NEOMYCIN-POLYMYXIN-HC 3.5-10000-1 OT SUSP: 4.0000 [drp] | Freq: Three times a day (TID) | OTIC | 0 refills | Status: DC

## 2019-03-20 MED ORDER — OXYCODONE-ACETAMINOPHEN 5-325 MG PO TABS
1.0000 | ORAL_TABLET | Freq: Four times a day (QID) | ORAL | Status: DC | PRN
  Administered 2019-03-21 – 2019-03-24 (×11): 1 via ORAL
  Filled 2019-03-20 (×11): qty 1

## 2019-03-20 MED ORDER — CALCIUM ACETATE 667 MG PO CAPS: 2001.0000 mg | ORAL_CAPSULE | ORAL | Status: DC

## 2019-03-20 MED ORDER — METHOCARBAMOL 500 MG PO TABS: 500.0000 mg | ORAL_TABLET | Freq: Two times a day (BID) | ORAL | 0 refills | Status: DC

## 2019-03-20 MED ORDER — SODIUM CHLORIDE 0.9 % IV BOLUS: 1000.0000 mL | Freq: Once | INTRAVENOUS | Status: DC

## 2019-03-20 MED ORDER — FENTANYL CITRATE (PF) 100 MCG/2ML IJ SOLN
50.0000 ug | Freq: Once | INTRAMUSCULAR | Status: AC
  Administered 2019-03-20: 50 ug via INTRAVENOUS
  Filled 2019-03-20: qty 2

## 2019-03-20 MED ORDER — FENTANYL 50 MCG/HR TD PT72
1.0000 | MEDICATED_PATCH | TRANSDERMAL | Status: DC
  Administered 2019-03-20 – 2019-03-23 (×2): 1 via TRANSDERMAL
  Filled 2019-03-20 (×2): qty 1

## 2019-03-20 MED ORDER — CALCIUM ACETATE (PHOS BINDER) 667 MG PO CAPS
2668.0000 mg | ORAL_CAPSULE | Freq: Three times a day (TID) | ORAL | Status: DC
  Administered 2019-03-21 – 2019-03-24 (×6): 2668 mg via ORAL
  Filled 2019-03-20 (×9): qty 4

## 2019-03-20 MED ORDER — SODIUM POLYSTYRENE SULFONATE PO POWD: 15.0000 g | ORAL | Status: DC

## 2019-03-20 MED ORDER — HEPARIN SODIUM (PORCINE) 5000 UNIT/ML IJ SOLN
5000.0000 [IU] | Freq: Three times a day (TID) | INTRAMUSCULAR | Status: DC
  Administered 2019-03-20: 5000 [IU] via SUBCUTANEOUS
  Filled 2019-03-20: qty 1

## 2019-03-20 MED ORDER — OXYCODONE HCL 5 MG PO TABS
5.0000 mg | ORAL_TABLET | Freq: Four times a day (QID) | ORAL | Status: DC | PRN
  Administered 2019-03-21 – 2019-03-24 (×11): 5 mg via ORAL
  Filled 2019-03-20 (×11): qty 1

## 2019-03-20 MED ORDER — CLOPIDOGREL BISULFATE 75 MG PO TABS
75.0000 mg | ORAL_TABLET | Freq: Every day | ORAL | Status: DC
  Filled 2019-03-20: qty 1

## 2019-03-20 MED ORDER — OXYCODONE-ACETAMINOPHEN 10-325 MG PO TABS: 1.0000 | ORAL_TABLET | Freq: Four times a day (QID) | ORAL | Status: DC | PRN

## 2019-03-20 MED ORDER — CALCIUM ACETATE (PHOS BINDER) 667 MG PO CAPS
2001.0000 mg | ORAL_CAPSULE | ORAL | Status: DC
  Administered 2019-03-21: 2001 mg via ORAL

## 2019-03-20 MED ORDER — SODIUM POLYSTYRENE SULFONATE 15 GM/60ML PO SUSP
15.0000 g | ORAL | Status: DC
  Filled 2019-03-20: qty 60

## 2019-03-20 MED ORDER — LEVOTHYROXINE SODIUM 25 MCG PO TABS
125.0000 ug | ORAL_TABLET | Freq: Every day | ORAL | Status: DC
  Administered 2019-03-21 – 2019-03-24 (×4): 125 ug via ORAL
  Filled 2019-03-20 (×4): qty 1

## 2019-03-20 MED ORDER — FAMOTIDINE 20 MG PO TABS
20.0000 mg | ORAL_TABLET | Freq: Every day | ORAL | Status: DC
  Administered 2019-03-20 – 2019-03-24 (×5): 20 mg via ORAL
  Filled 2019-03-20 (×5): qty 1

## 2019-03-20 MED ORDER — ONDANSETRON HCL 4 MG/2ML IJ SOLN
4.0000 mg | Freq: Once | INTRAMUSCULAR | Status: AC
  Administered 2019-03-20: 4 mg via INTRAVENOUS
  Filled 2019-03-20: qty 2

## 2019-03-20 MED ORDER — TRAZODONE HCL 100 MG PO TABS
100.0000 mg | ORAL_TABLET | Freq: Every day | ORAL | Status: DC
  Administered 2019-03-20 – 2019-03-23 (×4): 100 mg via ORAL
  Filled 2019-03-20 (×4): qty 1

## 2019-03-20 NOTE — ED Triage Notes (Signed)
Pt arrives via ems from home with nephrostomy tube. Pt states some bleeding from site and ? If tube is still in the right place. Pt endorses increased pain.  97 117 CBG 98% RA 78HR 140/78

## 2019-03-20 NOTE — ED Provider Notes (Signed)
[name] Provider Note   CSN: [id number] Arrival date & time: 03/20/19  1310     History Chief Complaint  Patient presents with  . Nephrostomy Tube Malfunction    [name] is a 54 y.o. male with PMH/o anemia, ESRD (M,W,F dialysis), left sided ostomy tube who presents for evaluation of pain, increased drainage around his nephrostomy tube.  He states that the most recent insertion of the tube was in November 2020.  He reports since then, he has always had pain.  He feels like over the last 3 days, the pain has worsened.  Additionally, he has noticed increased bleeding and drainage surrounding the tube.  He states that he feels like the tube got yanked at but does not know how.  He states that he has the bag but he does know securing device.  He states he has had a mild fever yesterday of up to 101.  He states he has just "felt not right."  Denies any vomiting but he has had nausea.  He states that the output in his nephrostomy bag has decreased over the last 3 days.  He has not called his urologist.  He is scheduled for nephrostomy tube replacement in about 1 week.  Denies any difficulty breathing, chest pain.  The history is provided by the patient.       Past Medical History:  Diagnosis Date  . Anemia   . Anxiety   . Constipation   . End stage renal failure on dialysis [facility])    [place]; MWF, on HD since 1997, exhausted upper ext access, and possibly LE access; cath dependent in R groin as of 06/2018  . GERD (gastroesophageal reflux disease)   . Grand mal seizure ([place]) X 4   last 1998 (02/29/2016)  . Heart murmur    no problems with per pt. - nephrologist and Dr. Coletta Memos  . History of blood transfusion 2000s   "I was having blood loss; never found out from where"  . History of kidney stones   . Hypertension    hx of - has not taken bp meds in over 2 years  since dialysis  . Hypothyroidism   . Insomnia   . Migraine    "due to my  BP in my late 1990s; none since" (02/29/2016)  . Nonhealing surgical wound    of the left arteriovenous graft  . Pneumonia    x 2  . Spina bifida [facility])     Patient Active Problem List   Diagnosis Date Noted  . Nephrostomy complication ([place]) 12/45/8099  . Abnormal CT scan, gastrointestinal tract   . Flank pain 11/26/2018  . Nephrolithiasis 11/26/2018  . Hydronephrosis with renal and ureteral calculus obstruction 11/26/2018  . Left flank pain 11/26/2018  . ESRD on dialysis ([place]) 06/12/2018  . PAD (peripheral artery disease) ([place]) 12/22/2017  . Problem with dialysis access ([place]) 12/21/2017  . Anemia 12/21/2017  . Hypothyroidism 12/21/2017  . GERD (gastroesophageal reflux disease) 12/21/2017  . Anxiety 12/21/2017  . Left great toe amputee ([place]) 12/19/2017  . Endocarditis of mitral valve   . Osteomyelitis of toe of left foot ([place])   . MSSA bacteremia 12/08/2017  . Fever   . Right flank pain   . ESRD (end stage renal disease) ([place]) 09/20/2017  . ESRD (end stage renal disease) on dialysis ([place]) 08/07/2017  . Nonhealing surgical wound 02/29/2016  . Clotted dialysis access ([place]) 01/14/2016  . Removal of staples 07/19/2012  .  Complication of AV dialysis fistula 06/27/2012  . End stage renal disease ([place]) 06/27/2012    Past Surgical History:  Procedure Laterality Date  . AMPUTATION Left 12/11/2017   Procedure: Left Great Toe Amputation;  Surgeon: [name], MD;  Location: [place];  Service: Orthopedics;  Laterality: Left;  . APPENDECTOMY    . ARTERIOVENOUS GRAFT PLACEMENT Left 10/16/2012   left femoral goretex graft         Dr Donnetta Hutching  . AV FISTULA PLACEMENT Left 06/27/2012   Procedure: EXPLORATORY LEFT THY-GRAFT PSEUDO-ANEURYSM;  Surgeon: Conrad [place], MD;  Location: [place];  Service: Vascular;  Laterality: Left;  Revision of left Arteriovenus gortex graft in thigh.  . COLONOSCOPY    . FLEXIBLE SIGMOIDOSCOPY N/A 12/03/2018   Procedure: FLEXIBLE SIGMOIDOSCOPY;  Surgeon: Yetta Flock, MD;  Location: [place];  Service: Gastroenterology;  Laterality: N/A;  . I & D EXTREMITY Left 02/29/2016   Procedure: DEBRIDEMENT LEFT THIGH WOUND;  Surgeon: Waynetta Sandy, MD;  Location: [place];  Service: Vascular;  Laterality: Left;  . ILEOSTOMY  1970s  . INCISION AND DRAINAGE Left 10/16/2012   Procedure: INCISION AND Debridement left thigh graft;  Surgeon: Rosetta Posner, MD;  Location: [place];  Service: Vascular;  Laterality: Left;  . INSERTION OF DIALYSIS CATHETER    . INSERTION OF DIALYSIS CATHETER  01/14/2016   Procedure: INSERTION OF DIALYSIS CATHETER;  Surgeon: Waynetta Sandy, MD;  Location: [place];  Service: Vascular;;  . INSERTION OF DIALYSIS CATHETER Right 08/07/2017   Procedure: INSERTION OF DIALYSIS CATHETER;  Surgeon: Waynetta Sandy, MD;  Location: [place];  Service: Vascular;  Laterality: Right;  . INSERTION OF DIALYSIS CATHETER Right 12/21/2017   Procedure: RIGHT FEMORAL DIALYSIS CATHETER EXCHANGE;  Surgeon: Marty Heck, MD;  Location: [place];  Service: Vascular;  Laterality: Right;  . INSERTION OF ILIAC STENT Left 01/14/2016   Procedure: INSERTION OF ILIAC STENT;  Surgeon: Waynetta Sandy, MD;  Location: [place];  Service: Vascular;  Laterality: Left;  . INTRAOPERATIVE ARTERIOGRAM Left 01/14/2016   Procedure: INTRA OPERATIVE ARTERIOGRAM;  Surgeon: Waynetta Sandy, MD;  Location: [place];  Service: Vascular;  Laterality: Left;  . IR NEPHROSTOMY PLACEMENT LEFT  11/28/2018  . LITHOTRIPSY  x 3   "& a laser treatment" (02/29/2016)  . NEPHROSTOMY Bilateral 1968  . PATELLAR TENDON REPAIR Right 1990s   "big incision"  . PERIPHERAL VASCULAR CATHETERIZATION  01/14/2016   Procedure: A/V SHUNTOGRAM;  Surgeon: Waynetta Sandy, MD;  Location: [place];  Service: Vascular;;  . REVISION OF ARTERIOVENOUS GORETEX GRAFT Left 10/16/2012   Procedure: REVISION OF LEFT FEMORAL LOOP ARTERIOVENOUS GORETEX GRAFT;  Surgeon: Rosetta Posner,  MD;  Location: [place];  Service: Vascular;  Laterality: Left;  . REVISION OF ARTERIOVENOUS GORETEX GRAFT Left 02/11/2015   Procedure: EXCISION OF SMALL SEGMENT OF EXPOSED LEFT THIGH NON FUNCTIONING  ARTERIOVENOUS GORETEX GRAFT;  Surgeon: Mal Misty, MD;  Location: [place];  Service: Vascular;  Laterality: Left;  . REVISION OF ARTERIOVENOUS GORETEX GRAFT Left 01/14/2016   Procedure: REVISION OF ARTERIOVENOUS GORETEX GRAFT;  Surgeon: Waynetta Sandy, MD;  Location: [place];  Service: Vascular;  Laterality: Left;  . REVISION OF ARTERIOVENOUS GORETEX GRAFT Left 02/29/2016   thigh/pt report  . REVISION OF ARTERIOVENOUS GORETEX GRAFT Left 02/29/2016   Procedure: POSSIBLE REVISION OF LEFT THIGH ARTERIOVENOUS GORETEX GRAFT;  Surgeon: Waynetta Sandy, MD;  Location: [place];  Service: Vascular;  Laterality:  Left;  . TEE WITHOUT CARDIOVERSION N/A 12/14/2017   Procedure: TRANSESOPHAGEAL ECHOCARDIOGRAM (TEE);  Surgeon: Acie Fredrickson, Wonda Cheng, MD;  Location: [place];  Service: Cardiovascular;  Laterality: N/A;  . THROMBECTOMY AND REVISION OF ARTERIOVENTOUS (AV) GORETEX  GRAFT Left 06/12/2018   Procedure: Removal of infected left thigh graft;  Surgeon: Rosetta Posner, MD;  Location: [place];  Service: Vascular;  Laterality: Left;  . THROMBECTOMY W/ EMBOLECTOMY Left 01/14/2016   Procedure: THROMBECTOMY ARTERIOVENOUS GORE-TEX Left thigh GRAFT;  Surgeon: Waynetta Sandy, MD;  Location: [place];  Service: Vascular;  Laterality: Left;  . TONGUE SURGERY  ~ 1990   tongue-tie release   . ULTRASOUND GUIDANCE FOR VASCULAR ACCESS Right 08/07/2017   Procedure: ULTRASOUND GUIDANCE FOR VASCULAR CANNULATION RIGHT FEMORAL VEIN AND LEFT AV FEMORAL GRAFT;  Surgeon: Waynetta Sandy, MD;  Location: [place];  Service: Vascular;  Laterality: Right;  . UPPER EXTREMITY VENOGRAPHY Bilateral 08/09/2017   Procedure: UPPER EXTREMITY VENOGRAPHY;  Surgeon: Serafina Mitchell, MD;  Location: [place] CV [facility];  Service:  Cardiovascular;  Laterality: Bilateral;  . VENOGRAM N/A 09/20/2017   Procedure: RIGHT COMMON FEMORAL ARTERY EXPLORATION.  CANNULATION RIGHT COMMON FEMORAL VEIN. VENOGRAM CENTRAL ULTRA SOUND GUIDED RIGHT FEMORAL VEIN TIMES TWO.;  Surgeon: Waynetta Sandy, MD;  Location: [facility];  Service: Vascular;  Laterality: N/A;  . WOUND DEBRIDEMENT Left 02/29/2016   thigh       Family History  Problem Relation Age of Onset  . Diabetes Mother   . Hypertension Mother   . Heart disease Mother        before age 77  . Diabetes Father   . Heart attack Father        X's 3  . Diabetes Sister   . Bipolar disorder Sister     Social History   Tobacco Use  . Smoking status: Never Smoker  . Smokeless tobacco: Never Used  Substance Use Topics  . Alcohol use: Not Currently    Alcohol/week: 0.0 standard drinks    Comment: 02/29/2016 "nothing since  the mid 1990s  . Drug use: Not Currently    Types: Marijuana    Comment: 02/29/2016 "nothing since ~ 1995"    Home Medications Prior to Admission medications   Medication Sig Start Date End Date Taking? Authorizing Provider  calcium acetate (PHOSLO) 667 MG capsule Take 3-4 capsules (2,001-2,668 mg total) by mouth See admin instructions. Take 2708 mg by mouth 3 times daily with meals, take 2001 mg by mouth with snacks Patient taking differently: Take 2,001-2,668 mg by mouth See admin instructions. Take 2,668 mg by mouth three times a day with meals and 2,001 mg with optional snacks 01/14/18  Yes Hennie Duos, MD  clopidogrel (PLAVIX) 75 MG tablet Take 1 tablet (75 mg total) by mouth daily. Patient taking differently: Take 75 mg by mouth at bedtime.  01/14/18  Yes Hennie Duos, MD  famotidine (PEPCID) 40 MG tablet Take 1 tablet (40 mg total) by mouth daily at 2 PM. Patient taking differently: Take 40 mg by mouth daily.  01/14/18  Yes Hennie Duos, MD  fentaNYL (DURAGESIC) 50 MCG/HR Place 1 patch onto the skin every 3 (three) days. 02/25/19   Yes [provider]  fluticasone (FLONASE) 50 MCG/ACT nasal spray Place 2 sprays into both nostrils as needed for allergies or rhinitis (congestion). Patient taking differently: Place 2 sprays into both nostrils as needed for allergies or rhinitis (or congestion).  01/14/18  Yes Inocencio Homes  D, MD  furosemide (LASIX) 80 MG tablet Take 80 mg by mouth 2 (two) times daily as needed (fluid).   Yes [provider]  levothyroxine (SYNTHROID, LEVOTHROID) 125 MCG tablet Take 1 tablet (125 mcg total) by mouth daily before breakfast. 01/14/18  Yes Hennie Duos, MD  lidocaine (LIDODERM) 5 % Place 2 patches onto the skin daily. 02/03/19  Yes [provider]  LORazepam (ATIVAN) 1 MG tablet Take 1 mg by mouth daily as needed (anxiety).    Yes [provider]  Nutritional Supplements (NOVASOURCE RENAL PO) Take 237 mLs by mouth daily.   Yes [provider]  oxyCODONE-acetaminophen (PERCOCET) 10-325 MG tablet Take 1 tablet by mouth every 6 (six) hours as needed for pain. 06/14/18  Yes Dagoberto Ligas, PA-C  promethazine (PHENERGAN) 25 MG tablet Take 1 tablet (25 mg total) by mouth 2 (two) times daily as needed for nausea or vomiting. Patient taking differently: Take 25 mg by mouth every 8 (eight) hours as needed for nausea or vomiting.  01/14/18  Yes Hennie Duos, MD  sodium polystyrene (KAYEXALATE) powder Take 15 g by mouth See admin instructions. Take 15 g (mixed in water) by mouth on Saturdays and Sundays 01/14/18  Yes Hennie Duos, MD  traZODone (DESYREL) 100 MG tablet Take 1 tablet (100 mg total) by mouth at bedtime. 01/14/18  Yes Hennie Duos, MD  zolpidem (AMBIEN) 10 MG tablet Take 1 tablet (10 mg total) by mouth at bedtime. 01/14/18  Yes Hennie Duos, MD    Allergies    Hydrocodone, Furadantin [nitrofurantoin], Mandelamine [methenamine], Noroxin [norfloxacin], Carmine, Contrast media [iodinated diagnostic agents], Metrizamide, Sulfa  antibiotics, and Sulfur  Review of Systems   Review of Systems  Constitutional: Positive for fever.  Respiratory: Negative for cough and shortness of breath.   Cardiovascular: Negative for chest pain.  Gastrointestinal: Positive for abdominal pain and nausea. Negative for vomiting.  Genitourinary: Negative for dysuria and hematuria.  Skin: Positive for wound.  Neurological: Negative for headaches.  All other systems reviewed and are negative.   Physical Exam Updated Vital Signs BP 133/67 (BP Location: Right Arm)   Pulse 72   Temp 99.1 F (37.3 C) (Oral)   Resp 18   Ht 5\' 11"  (1.803 m)   Wt 119 kg   SpO2 100%   BMI 36.59 kg/m   Physical Exam Vitals and nursing note reviewed.  Constitutional:      Appearance: Normal appearance. He is well-developed.  HENT:     Head: Normocephalic and atraumatic.  Eyes:     General: Lids are normal.     Conjunctiva/sclera: Conjunctivae normal.     Pupils: Pupils are equal, round, and reactive to light.  Cardiovascular:     Rate and Rhythm: Normal rate and regular rhythm.     Pulses: Normal pulses.     Heart sounds: Normal heart sounds. No murmur. No friction rub. No gallop.   Pulmonary:     Effort: Pulmonary effort is normal.     Breath sounds: Normal breath sounds.  Abdominal:     Palpations: Abdomen is soft. Abdomen is not rigid.     Tenderness: There is no abdominal tenderness. There is no guarding.     Comments: Nephrostomy tube noted in the left flank.  He does appear to be slightly out of position as the sutures are about 4 to 5 cm away from the actual incision site.  The tube itself has amount of bloody/purulent drainage around it.  He has tenderness around the area.  No surrounding warmth, erythema.  Generalized abdominal tenderness with no focal point.  Ostomy bag noted in right mid abdomen.  No surrounding warmth, erythema.  Musculoskeletal:        General: Normal range of motion.     Cervical back: Full passive range of  motion without pain.  Skin:    General: Skin is warm and dry.     Capillary Refill: Capillary refill takes less than 2 seconds.  Neurological:     Mental Status: He is alert and oriented to person, place, and time.  Psychiatric:        Speech: Speech normal.        ED Results / Procedures / Treatments   Labs (all labs ordered are listed, but only abnormal results are displayed) Labs Reviewed  COMPREHENSIVE METABOLIC PANEL - Abnormal; Notable for the following components:      Result Value   Chloride 95 (*)    BUN 36 (*)    Creatinine, Ser 6.84 (*)    Total Protein 8.6 (*)    Albumin 3.4 (*)    AST 13 (*)    GFR calc non Af Amer 8 (*)    GFR calc Af Amer 10 (*)    Anion gap 17 (*)    All other components within normal limits  CBC WITH DIFFERENTIAL/PLATELET - Abnormal; Notable for the following components:   RBC 3.84 (*)    Hemoglobin 11.3 (*)    HCT 37.6 (*)    Lymphs Abs 0.4 (*)    All other components within normal limits  SARS CORONAVIRUS 2 (TAT 6-24 HRS)  URINE CULTURE  URINALYSIS, ROUTINE W REFLEX MICROSCOPIC  RENAL FUNCTION PANEL  CBC  TYPE AND SCREEN    EKG None  Radiology CT Renal Stone Study  Result Date: 03/20/2019 CLINICAL DATA:  Nephrostomy tube placement. Bleeding from nephrostomy tube with concern for malpositioning. EXAM: CT ABDOMEN AND PELVIS WITHOUT CONTRAST TECHNIQUE: Multidetector CT imaging of the abdomen and pelvis was performed following the standard protocol without IV contrast. COMPARISON:  December 01, 2018. FINDINGS: Lower chest: The lung bases are clear. The heart size is normal. Hepatobiliary: The liver is normal. Cholelithiasis without acute inflammation.There is no biliary ductal dilation. Pancreas: Normal contours without ductal dilatation. No peripancreatic fluid collection. Spleen: The spleen is enlarged measuring approximately 14 cm craniocaudad. Adrenals/Urinary Tract: --Adrenal glands: No adrenal hemorrhage. --Right kidney/ureter:  The right kidney is atrophic with multiple nonobstructing stones measuring up to approximately 1.3 cm. --Left kidney/ureter: The left kidney is atrophic. There is a left-sided nephrostomy tube in place that appears to be well positioned. There is no hydronephrosis. Multiple simple and complex cysts are again noted. There are multiple nonobstructing stones. There is a punctate likely partially obstructing stone in the proximal left ureter measuring approximately 2 mm. There is left-sided perinephric and paranephric fat stranding which is relatively similar across prior studies. --Urinary bladder: The bladder is decompressed and therefore is poorly evaluated. Stomach/Bowel: --Stomach/Duodenum: No hiatal hernia or other gastric abnormality. Normal duodenal course and caliber. --Small bowel: There is a right lower quadrant ostomy. There is a small peristomal hernia. This hernia contains fat, small bowel loops, and dilated mesenteric veins. --Colon: There is a large amount of stool in the colon. --Appendix: Not visualized. No right lower quadrant inflammation or free fluid. Vascular/Lymphatic: Atherosclerotic calcification is present within the non-aneurysmal abdominal aorta, without hemodynamically significant stenosis. There is a right femoral approach dialysis catheter in  place. The dialysis catheter terminates at the level of the renal veins. 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 There is a left common iliac vein stent in place, the patency of which cannot be determined. There is a probable abandon left thigh AV graft. Collateral veins are noted coursing through the patient's retroperitoneal space. --there are prominent retroperitoneal lymph nodes that are essentially stable from prior study. --No mesenteric lymphadenopathy. --No pelvic or inguinal lymphadenopathy. Reproductive: Unremarkable Other: No ascites or free air. The abdominal wall is normal. Musculoskeletal. Renal osteodystrophy is noted. There  is no acute displaced fracture. IMPRESSION: 1. Well position left-sided nephrostomy tube without evidence for hydronephrosis. 2. Bilateral nonobstructing nephrolithiasis. There is a partially obstructing 2.5 mm stone in the proximal left ureter as before. 3. There is cholelithiasis without secondary signs of acute cholecystitis. 4. There are multiple additional chronic findings as detailed above. Aortic Atherosclerosis (ICD10-I70.0). Electronically Signed   By: Constance Holster M.D.   On: 03/20/2019 15:34    Procedures Procedures (including critical care time)  Medications Ordered in ED Medications  fentaNYL (DURAGESIC) 50 MCG/HR 1 patch (has no administration in time range)  oxyCODONE-acetaminophen (PERCOCET) 10-325 MG per tablet 1 tablet (has no administration in time range)  traZODone (DESYREL) tablet 100 mg (has no administration in time range)  zolpidem (AMBIEN) tablet 5 mg (has no administration in time range)  levothyroxine (SYNTHROID) tablet 125 mcg (has no administration in time range)  calcium acetate (PHOSLO) capsule 2,001-2,668 mg (has no administration in time range)  famotidine (PEPCID) tablet 40 mg (has no administration in time range)  clopidogrel (PLAVIX) tablet 75 mg (has no administration in time range)  sodium polystyrene (KAYEXALATE) powder 15 g (has no administration in time range)  heparin injection 5,000 Units (has no administration in time range)  fentaNYL (SUBLIMAZE) injection 50 mcg (50 mcg Intravenous Given 03/20/19 1501)  ondansetron (ZOFRAN) injection 4 mg (4 mg Intravenous Given 03/20/19 1459)  fentaNYL (SUBLIMAZE) injection 50 mcg (50 mcg Intravenous Given 03/20/19 1712)    ED Course  I have reviewed the triage vital signs and the nursing notes.  Pertinent labs & imaging results that were available during my care of the patient were reviewed by me and considered in my medical decision making (see chart for details).    MDM Rules/Calculators/A&P                       54 year old male with past medical history of ESRD (Monday, Wednesday, Friday dialysis with last dialysis yesterday), who presents for evaluation of nephrostomy tube malfunction.  He feels like it got malpositioned a few days ago and since then has had worsening pain, drainage from the area.  He does not feel like the tube itself has been draining.  He is scheduled for a left-sided nephrostomy tube replacement in about a week.  Also endorses fever, nausea.  On initial ED arrival, he is afebrile, nontoxic-appearing.  Vital signs are stable.  His left nephrostomy tube does appear misplaced as the sutures itself are about 5 cm from the insertion site.  The tube itself has drainage around it with some tenderness.  We will plan for labs, CT renal, consult urology.  CMP shows BUN of 36, creatinine of 6.4.  Anion gap of 17.  CBC shows no leukocytosis.  Hemoglobin stable at 11.3.  CT renal study shows well-positioned left-sided nephrostomy tube without any evidence of hydronephrosis.  He has bilateral nonobstructing nephrolithiasis.  There is a partially obstructed  2.5 mm stone in the proximal left ureter that was seen on previous imaging.  Discussed patient with Dr. Junious Silk (Urology). He reviewed patient's CT and imaging. He feels like this is normal discharge for the tube and is reassured by workup. He recommends flushing the tube and cleaning around the insertion site.   RN attempted to flush the tube and was unsuccessful after multiple attempts.   Discussed patient with Dr. Junious Silk (Urology). He recommends admitting the patient overnight with plans for IR to drain it tomorrow. He will put in IR orders and requests medical admission given dialysis.    Discussed patient with Internal Medicine team who accepts patient for admission.   Portions of this note were generated with [employer]. Dictation errors may occur despite best attempts at proofreading.  Final Clinical  Impression(s) / ED Diagnoses Final diagnoses:  Malfunction of nephrostomy tube [facility])    Rx / DC Orders    Volanda Napoleon, PA-C 03/20/19 Collins, Ankit, MD 03/24/19 [phone]

## 2019-03-20 NOTE — H&P (Addendum)
[facility] Admission History and Physical Service Pager: [phone]  Patient name: [name] Medical record number: [id number] Date of birth: October 05, 1965 Age: 54 y.o. Gender: male  Primary Care Provider: [name], MD Consultants: urology, IR Code Status: full Preferred Emergency Contact: sister (although patient states she doesn't want to be contacted any more)  Chief Complaint: left flank pain  Assessment and Plan: [name] is a 54 y.o. male presenting with pain at nephrostomy insertion site without drainage. PMH is significant for ESRD, renal colic and spina bifida.  Nephrostomy dysfunction, renal colic Nephrostomy tubing placed 11/2018 to relieve pressure from uretal and renal stones. patient is oliguric. The tubing was scheduled to be replaced much earlier but was delayed. Staff attempted to flush the tubing in ED but unable to due to blood clotting. CT showed proper placement of the nephrostomy. On exam, there is some mild mucoid drainage from the site (see clinical image for further detail). The dysfunction is likely a combination of prolonging replacement and trauma to the site causing blood clotting in the tubing. Low suspicion for infection at this time as his WBC are wnl and he is afebrile/stable. 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 Patient also has ileal conduit with collection bag on RLQ which patient empties approximately 200cc every 2 weeks which improves with lasix use. Urology has assessed patient and recommends IR will change left nephrostomy tube and send urine for culture. Urology will have anterograde ureteroscopy and nephrolithotomy 3/24 with Dr Tresa Moore. A functioning nephrostomy tube is needed for these procedures. Urology has now signed off. Patient has had temperatures at home over the weekend but is afebrile in ED.  -Admit to med surg, Dr Ardelia Mems  -Vitals poor floor routine  -Up with assistance -NPO at midnight -IR  scheduled to changed nephrostomy -Urology following, appreciate recommendations  -CBC am -Renal function panel am -F/u urine cultures - monitor for signs of infection (fever) - strict I/Os (oliguric) - pain control with patient's home regimen - holding home lasix 80mg  BID  ESRD Cr 6.84, BUN 36,   [facility]  MWF, last had dialysis yesterday, likely have dialysis tomorrow -Will consult Nephrology am  -Renal function panel am - continue home binders, etc: kayexalate, calcium acetate  Normocytic anemia- hgb 11.3 on admission which seems to be markedly improved from baseline ~8. H/o Plavix use for unknown cause. - continue home plavix and inquire reason from patient tomorrow. 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 Prophylaxis: Heparin   Disposition: IP for the next 1-2 days   History of Present Illness:  [name] is a 54 y.o. male presenting with leaking left nephrostomy for 3 days and intractable pain. 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 This was repaired surgically leaving him with permanent damage to kidneys.   Patient expresses having difficulty and pain with nephrostomy tubing since placement in 11/2018. The situation has become much worse over the past week after he accidentally tugged on the tubing so the stitch holding in place was ripped. He has seen blood coming from the insertion site and  into the tubing since that time. Has not had any other drainage into the tubing since onset. He has had increased left flank pain as well as nausea and two fevers at home  as high as 101. He took tylenol which resolved the fevers but the pain has not improved. 9/10 located at site as well as anteriorly in abdomen. He uses a fentanyl patch chronically which recently had reduced dose and states that is not helping his pain any longer. Drain was not able to be flushed in ED. His ileal conduit is functioning normally.   Denies coughing, palpitations, CP, headache, sore throat.   Lives with sister who has disabilities and not able to help care for him. He does not have home health. Used [facility] in the past. Can usually walk 38ft before feeling tired. Uses wheelchair when he's out.   Review Of Systems: Per HPI with the following additions:   Review of Systems  Constitutional: Positive for chills and fever. Negative for weight loss.  Respiratory: Negative for cough and shortness of breath.   Cardiovascular: Negative for chest pain.  Gastrointestinal: Positive for abdominal pain. Negative for constipation, diarrhea, nausea and vomiting.  Genitourinary:       Oliguria  Musculoskeletal: Positive for back pain.    Patient Active Problem List   Diagnosis Date Noted  . Nephrostomy complication ([place]) 27/25/3664  . Abnormal CT scan, gastrointestinal tract   . Flank pain 11/26/2018  . Nephrolithiasis 11/26/2018  . Hydronephrosis with renal and ureteral calculus obstruction 11/26/2018  . Left flank pain 11/26/2018  . ESRD on dialysis ([place]) 06/12/2018  . PAD (peripheral artery disease) ([place]) 12/22/2017  . Problem with dialysis access ([place]) 12/21/2017  . Anemia 12/21/2017  . Hypothyroidism 12/21/2017  . GERD (gastroesophageal reflux disease) 12/21/2017  . Anxiety 12/21/2017  . Left great toe amputee ([place]) 12/19/2017  . Endocarditis of mitral valve   . Osteomyelitis of toe of left foot ([place])   . MSSA bacteremia 12/08/2017  . Fever   . Right flank pain   . ESRD (end stage renal disease) ([place]) 09/20/2017  . ESRD (end stage renal disease) on dialysis ([place])  08/07/2017  . Nonhealing surgical wound 02/29/2016  . Clotted dialysis access ([place]) 01/14/2016  . Removal of staples 07/19/2012  . Complication of AV dialysis fistula 06/27/2012  . End stage renal disease ([place]) 06/27/2012    Past Medical History: Past Medical History:  Diagnosis Date  . Anemia   . Anxiety   . Constipation   . End stage renal failure on dialysis [facility])    [place]; MWF, on HD since 1997, exhausted upper ext access, and possibly LE access; cath dependent in R groin as of 06/2018  . GERD (gastroesophageal reflux disease)   . Grand mal seizure ([place]) X 4   last 1998 (02/29/2016)  . Heart murmur    no problems with per pt. - nephrologist and Dr. Coletta Memos  . History of blood transfusion 2000s   "I was having blood loss; never found out from where"  . History of kidney stones   . Hypertension    hx of - has not taken bp meds in over 2 years  since dialysis  . Hypothyroidism   . Insomnia   . Migraine    "due to my BP in my late 1990s; none since" (02/29/2016)  . Nonhealing surgical wound    of the left arteriovenous graft  . Pneumonia    x 2  . Spina bifida ([place])  Past Surgical History: Past Surgical History:  Procedure Laterality Date  . AMPUTATION Left 12/11/2017   Procedure: Left Great Toe Amputation;  Surgeon: [name], MD;  Location: [place];  Service: Orthopedics;  Laterality: Left;  . APPENDECTOMY    . ARTERIOVENOUS GRAFT PLACEMENT Left 10/16/2012   left femoral goretex graft         Dr Donnetta Hutching  . AV FISTULA PLACEMENT Left 06/27/2012   Procedure: EXPLORATORY LEFT THY-GRAFT PSEUDO-ANEURYSM;  Surgeon: Conrad Bluffs, MD;  Location: [place];  Service: Vascular;  Laterality: Left;  Revision of left Arteriovenus gortex graft in thigh.  . COLONOSCOPY    . FLEXIBLE SIGMOIDOSCOPY N/A 12/03/2018   Procedure: FLEXIBLE SIGMOIDOSCOPY;  Surgeon: Yetta Flock, MD;  Location: [place];  Service: Gastroenterology;  Laterality: N/A;  . I & D EXTREMITY Left  02/29/2016   Procedure: DEBRIDEMENT LEFT THIGH WOUND;  Surgeon: Waynetta Sandy, MD;  Location: [place];  Service: Vascular;  Laterality: Left;  . ILEOSTOMY  1970s  . INCISION AND DRAINAGE Left 10/16/2012   Procedure: INCISION AND Debridement left thigh graft;  Surgeon: Rosetta Posner, MD;  Location: [place];  Service: Vascular;  Laterality: Left;  . INSERTION OF DIALYSIS CATHETER    . INSERTION OF DIALYSIS CATHETER  01/14/2016   Procedure: INSERTION OF DIALYSIS CATHETER;  Surgeon: Waynetta Sandy, MD;  Location: [place];  Service: Vascular;;  . INSERTION OF DIALYSIS CATHETER Right 08/07/2017   Procedure: INSERTION OF DIALYSIS CATHETER;  Surgeon: Waynetta Sandy, MD;  Location: [place];  Service: Vascular;  Laterality: Right;  . INSERTION OF DIALYSIS CATHETER Right 12/21/2017   Procedure: RIGHT FEMORAL DIALYSIS CATHETER EXCHANGE;  Surgeon: Marty Heck, MD;  Location: [place];  Service: Vascular;  Laterality: Right;  . INSERTION OF ILIAC STENT Left 01/14/2016   Procedure: INSERTION OF ILIAC STENT;  Surgeon: Waynetta Sandy, MD;  Location: [place];  Service: Vascular;  Laterality: Left;  . INTRAOPERATIVE ARTERIOGRAM Left 01/14/2016   Procedure: INTRA OPERATIVE ARTERIOGRAM;  Surgeon: Waynetta Sandy, MD;  Location: [place];  Service: Vascular;  Laterality: Left;  . IR NEPHROSTOMY PLACEMENT LEFT  11/28/2018  . LITHOTRIPSY  x 3   "& a laser treatment" (02/29/2016)  . NEPHROSTOMY Bilateral 1968  . PATELLAR TENDON REPAIR Right 1990s   "big incision"  . PERIPHERAL VASCULAR CATHETERIZATION  01/14/2016   Procedure: A/V SHUNTOGRAM;  Surgeon: Waynetta Sandy, MD;  Location: [place];  Service: Vascular;;  . REVISION OF ARTERIOVENOUS GORETEX GRAFT Left 10/16/2012   Procedure: REVISION OF LEFT FEMORAL LOOP ARTERIOVENOUS GORETEX GRAFT;  Surgeon: Rosetta Posner, MD;  Location: [place];  Service: Vascular;  Laterality: Left;  . REVISION OF ARTERIOVENOUS GORETEX GRAFT Left  02/11/2015   Procedure: EXCISION OF SMALL SEGMENT OF EXPOSED LEFT THIGH NON FUNCTIONING  ARTERIOVENOUS GORETEX GRAFT;  Surgeon: Mal Misty, MD;  Location: [place];  Service: Vascular;  Laterality: Left;  . REVISION OF ARTERIOVENOUS GORETEX GRAFT Left 01/14/2016   Procedure: REVISION OF ARTERIOVENOUS GORETEX GRAFT;  Surgeon: Waynetta Sandy, MD;  Location: [place];  Service: Vascular;  Laterality: Left;  . REVISION OF ARTERIOVENOUS GORETEX GRAFT Left 02/29/2016   thigh/pt report  . REVISION OF ARTERIOVENOUS GORETEX GRAFT Left 02/29/2016   Procedure: POSSIBLE REVISION OF LEFT THIGH ARTERIOVENOUS GORETEX GRAFT;  Surgeon: Waynetta Sandy, MD;  Location: [place];  Service: Vascular;  Laterality: Left;  . TEE WITHOUT CARDIOVERSION N/A 12/14/2017   Procedure: TRANSESOPHAGEAL ECHOCARDIOGRAM (TEE);  Surgeon: Acie Fredrickson, Wonda Cheng, MD;  Location: [place];  Service: Cardiovascular;  Laterality: N/A;  . THROMBECTOMY AND REVISION OF ARTERIOVENTOUS (AV) GORETEX  GRAFT Left 06/12/2018   Procedure: Removal of infected left thigh graft;  Surgeon: Rosetta Posner, MD;  Location: [place];  Service: Vascular;  Laterality: Left;  . THROMBECTOMY W/ EMBOLECTOMY Left 01/14/2016   Procedure: THROMBECTOMY ARTERIOVENOUS GORE-TEX Left thigh GRAFT;  Surgeon: Waynetta Sandy, MD;  Location: [place];  Service: Vascular;  Laterality: Left;  . TONGUE SURGERY  ~ 1990   tongue-tie release   . ULTRASOUND GUIDANCE FOR VASCULAR ACCESS Right 08/07/2017   Procedure: ULTRASOUND GUIDANCE FOR VASCULAR CANNULATION RIGHT FEMORAL VEIN AND LEFT AV FEMORAL GRAFT;  Surgeon: Waynetta Sandy, MD;  Location: [place];  Service: Vascular;  Laterality: Right;  . UPPER EXTREMITY VENOGRAPHY Bilateral 08/09/2017   Procedure: UPPER EXTREMITY VENOGRAPHY;  Surgeon: Serafina Mitchell, MD;  Location: [place];  Service: Cardiovascular;  Laterality: Bilateral;  . VENOGRAM N/A 09/20/2017   Procedure: RIGHT COMMON FEMORAL ARTERY  EXPLORATION.  CANNULATION RIGHT COMMON FEMORAL VEIN. VENOGRAM CENTRAL ULTRA SOUND GUIDED RIGHT FEMORAL VEIN TIMES TWO.;  Surgeon: Waynetta Sandy, MD;  Location: [place];  Service: Vascular;  Laterality: N/A;  . WOUND DEBRIDEMENT Left 02/29/2016   thigh    Social History: Social History   Tobacco Use  . Smoking status: Never Smoker  . Smokeless tobacco: Never Used  Substance Use Topics  . Alcohol use: Not Currently    Alcohol/week: 0.0 standard drinks    Comment: 02/29/2016 "nothing since  the mid 1990s  . Drug use: Not Currently    Types: Marijuana    Comment: 02/29/2016 "nothing since ~ 1995"   Additional social history:  Please also refer to relevant sections of EMR.  Family History: Family History  Problem Relation Age of Onset  . Diabetes Mother   . Hypertension Mother   . Heart disease Mother        before age 86  . Diabetes Father   . Heart attack Father        X's 3  . Diabetes Sister   . Bipolar disorder Sister    (If not completed, MUST add something in)  Allergies and Medications: Allergies  Allergen Reactions  . Hydrocodone Other (See Comments)    Caused involuntary movement and twitching. CANNOT TAKE DUE TO MUSCLE SPASMS AND MUSCLE TREMORS  . Furadantin [Nitrofurantoin] Other (See Comments)    UNSPECIFIED REACTION   . Mandelamine [Methenamine] Other (See Comments)    UNSPECIFIED REACTION   . Noroxin [Norfloxacin] Other (See Comments)    UNSPECIFIED REACTION   . [place] Nausea Only  . Contrast Media [Iodinated Diagnostic Agents] Nausea And Vomiting    Oral dye causes vomiting, IV dye is okay  . Metrizamide Nausea And Vomiting    Oral dye causes vomiting, IV dye is okay  . Sulfa Antibiotics Other (See Comments) and Cough    Childhood reaction - pt could not confirm that it was a cough  . Sulfur Cough    Childhood reaction - pt could not confirm that it was a cough   No current facility-administered medications on file prior to encounter.    Current Outpatient Medications on File Prior to Encounter  Medication Sig Dispense Refill  . calcium acetate (PHOSLO) 667 MG capsule Take 3-4 capsules (2,001-2,668 mg total) by mouth See admin instructions. Take 2708 mg by mouth 3 times daily with meals, take 2001 mg by  mouth with snacks (Patient taking differently: Take 2,001-2,668 mg by mouth See admin instructions. Take 2,668 mg by mouth three times a day with meals and 2,001 mg with optional snacks) 450 capsule 0  . clopidogrel (PLAVIX) 75 MG tablet Take 1 tablet (75 mg total) by mouth daily. (Patient taking differently: Take 75 mg by mouth at bedtime. ) 30 tablet 0  . famotidine (PEPCID) 40 MG tablet Take 1 tablet (40 mg total) by mouth daily at 2 PM. (Patient taking differently: Take 40 mg by mouth daily. ) 30 tablet 0  . fentaNYL (DURAGESIC) 50 MCG/HR Place 1 patch onto the skin every 3 (three) days.    . fluticasone (FLONASE) 50 MCG/ACT nasal spray Place 2 sprays into both nostrils as needed for allergies or rhinitis (congestion). (Patient taking differently: Place 2 sprays into both nostrils as needed for allergies or rhinitis (or congestion). ) 0.003 g 0  . furosemide (LASIX) 80 MG tablet Take 80 mg by mouth 2 (two) times daily as needed (fluid).    Marland Kitchen levothyroxine (SYNTHROID, LEVOTHROID) 125 MCG tablet Take 1 tablet (125 mcg total) by mouth daily before breakfast. 30 tablet 0  . lidocaine (LIDODERM) 5 % Place 2 patches onto the skin daily.    Marland Kitchen LORazepam (ATIVAN) 1 MG tablet Take 1 mg by mouth daily as needed (anxiety).     . Nutritional Supplements (NOVASOURCE RENAL PO) Take 237 mLs by mouth daily.    Marland Kitchen oxyCODONE-acetaminophen (PERCOCET) 10-325 MG tablet Take 1 tablet by mouth every 6 (six) hours as needed for pain. 10 tablet 0  . promethazine (PHENERGAN) 25 MG tablet Take 1 tablet (25 mg total) by mouth 2 (two) times daily as needed for nausea or vomiting. (Patient taking differently: Take 25 mg by mouth every 8 (eight) hours as needed  for nausea or vomiting. ) 30 tablet 0  . sodium polystyrene (KAYEXALATE) powder Take 15 g by mouth See admin instructions. Take 15 g (mixed in water) by mouth on Saturdays and Sundays 135 g 0  . traZODone (DESYREL) 100 MG tablet Take 1 tablet (100 mg total) by mouth at bedtime. 30 tablet 0  . zolpidem (AMBIEN) 10 MG tablet Take 1 tablet (10 mg total) by mouth at bedtime. 7 tablet 0    Objective: BP 132/72 (BP Location: Right Arm)   Pulse 70   Temp 99 F (37.2 C) (Oral)   Resp 18   Ht 5\' 11"  (1.803 m)   Wt 119 kg   SpO2 100%   BMI 36.59 kg/m   Exam:   General: Well appearing 54 year old male, appears stated age Eyes: no Scleral icterus, normal EOM ENTM: no pharyngeal edema, no exudates, poor dentition  Neck: supple, normal ROM  Cardiovascular: S1 and S2 present, RRR, no rubs or gallops Respiratory: CTAB, normal WOB Gastrointestinal: Abdomen soft, tender in left flank, see pictures of nephrostomy site below GU: urostomy empty/dry. Nephrostomy site negative for frank pus or bleeding. No signs of spreading erythema. Tender to palpation diffusely surround the site both anterior and posterior. MSK: Moving arms equally, clubbed foot  Derm: skin warm and dry  Neuro: cranial nerves grossly in tact Psych: normal mood and normal affect        Labs and Imaging: CBC BMET  Recent Labs  Lab 03/20/19 1400  WBC 5.4  HGB 11.3*  HCT 37.6*  PLT 150   Recent Labs  Lab 03/20/19 1400  NA 138  K 4.0  CL 95*  CO2 26  BUN 36*  CREATININE 6.84*  GLUCOSE 94  CALCIUM 9.3     EKG: n/a    Lattie Haw, MD 03/20/2019, 8:01 PM PGY-1, [place] Intern pager: [phone], text pages welcome  [place]    I have seen and examined this patient.     I have discussed the findings and exam with the intern and agree with the above note, which I have edited appropriately in [place]. I helped develop the management plan that is described in the resident's  note, and I agree with the content.   Doristine Mango, DO PGY-2 Family Medicine Resident

## 2019-03-20 NOTE — Consult Note (Addendum)
Consultation: Malfunctioning left nephrostomy tube, ureteral stones Requested by: Dr. Varney Biles  History of Present Illness: [name] is a 54 year old male well-known to our service.  He underwent a left nephrostomy tube placement 11/28/2018 for left ureteral and renal stones.  He has a history of spina bifida and ileal conduit diversion as a child. He has ESRD on hemodialysis. Patient present to ED with failure of left Nx tube to drain. He is scheduled with Dr. Tresa Moore for left antegrade PCNL and ureteroscopy 03/26/2019.  Patient delayed procedure to allow Covid levels to fall.  He has not had the left nephrostomy exchanged since it was placed and it is no longer draining and he is draining urine around it.  CT scan shows the nephrostomy in good position, but nurses could not flush it. Pt reports he's had some flank pain since the tube was placed in November and that hasn't changed much.     Past Medical History:  Diagnosis Date  . Anemia   . Anxiety   . Constipation   . End stage renal failure on dialysis [facility])    [place]; MWF, on HD since 1997, exhausted upper ext access, and possibly LE access; cath dependent in R groin as of 06/2018  . GERD (gastroesophageal reflux disease)   . Grand mal seizure ([place]) X 4   last 1998 (02/29/2016)  . Heart murmur    no problems with per pt. - nephrologist and Dr. Coletta Memos  . History of blood transfusion 2000s   "I was having blood loss; never found out from where"  . History of kidney stones   . Hypertension    hx of - has not taken bp meds in over 2 years  since dialysis  . Hypothyroidism   . Insomnia   . Migraine    "due to my BP in my late 1990s; none since" (02/29/2016)  . Nonhealing surgical wound    of the left arteriovenous graft  . Pneumonia    x 2  . Spina bifida [facility])    Past Surgical History:  Procedure Laterality Date  . AMPUTATION Left 12/11/2017   Procedure: Left Great Toe Amputation;  Surgeon: [name], MD;  Location: [place];  Service: Orthopedics;  Laterality: Left;  . APPENDECTOMY    . ARTERIOVENOUS GRAFT PLACEMENT Left 10/16/2012   left femoral goretex graft         Dr Donnetta Hutching  . AV FISTULA PLACEMENT Left 06/27/2012   Procedure: EXPLORATORY LEFT THY-GRAFT PSEUDO-ANEURYSM;  Surgeon: Conrad [place], MD;  Location: [place];  Service: Vascular;  Laterality: Left;  Revision of left Arteriovenus gortex graft in thigh.  . COLONOSCOPY    . FLEXIBLE SIGMOIDOSCOPY N/A 12/03/2018   Procedure: FLEXIBLE SIGMOIDOSCOPY;  Surgeon: Yetta Flock, MD;  Location: [place];  Service: Gastroenterology;  Laterality: N/A;  . I & D EXTREMITY Left 02/29/2016   Procedure: DEBRIDEMENT LEFT THIGH WOUND;  Surgeon: Waynetta Sandy, MD;  Location: [place];  Service: Vascular;  Laterality: Left;  . ILEOSTOMY  1970s  . INCISION AND DRAINAGE Left 10/16/2012   Procedure: INCISION AND Debridement left thigh graft;  Surgeon: Rosetta Posner, MD;  Location: [place];  Service: Vascular;  Laterality: Left;  . INSERTION OF DIALYSIS CATHETER    . INSERTION OF DIALYSIS CATHETER  01/14/2016   Procedure: INSERTION OF DIALYSIS CATHETER;  Surgeon: Waynetta Sandy, MD;  Location: [place];  Service: Vascular;;  . INSERTION OF DIALYSIS CATHETER Right 08/07/2017  Procedure: INSERTION OF DIALYSIS CATHETER;  Surgeon: Waynetta Sandy, MD;  Location: [place];  Service: Vascular;  Laterality: Right;  . INSERTION OF DIALYSIS CATHETER Right 12/21/2017   Procedure: RIGHT FEMORAL DIALYSIS CATHETER EXCHANGE;  Surgeon: Marty Heck, MD;  Location: [place];  Service: Vascular;  Laterality: Right;  . INSERTION OF ILIAC STENT Left 01/14/2016   Procedure: INSERTION OF ILIAC STENT;  Surgeon: Waynetta Sandy, MD;  Location: [place];  Service: Vascular;  Laterality: Left;  . INTRAOPERATIVE ARTERIOGRAM Left 01/14/2016   Procedure: INTRA OPERATIVE ARTERIOGRAM;  Surgeon: Waynetta Sandy, MD;  Location: [place];  Service: Vascular;  Laterality:  Left;  . IR NEPHROSTOMY PLACEMENT LEFT  11/28/2018  . LITHOTRIPSY  x 3   "& a laser treatment" (02/29/2016)  . NEPHROSTOMY Bilateral 1968  . PATELLAR TENDON REPAIR Right 1990s   "big incision"  . PERIPHERAL VASCULAR CATHETERIZATION  01/14/2016   Procedure: A/V SHUNTOGRAM;  Surgeon: Waynetta Sandy, MD;  Location: [place];  Service: Vascular;;  . REVISION OF ARTERIOVENOUS GORETEX GRAFT Left 10/16/2012   Procedure: REVISION OF LEFT FEMORAL LOOP ARTERIOVENOUS GORETEX GRAFT;  Surgeon: Rosetta Posner, MD;  Location: [place];  Service: Vascular;  Laterality: Left;  . REVISION OF ARTERIOVENOUS GORETEX GRAFT Left 02/11/2015   Procedure: EXCISION OF SMALL SEGMENT OF EXPOSED LEFT THIGH NON FUNCTIONING  ARTERIOVENOUS GORETEX GRAFT;  Surgeon: Mal Misty, MD;  Location: [place];  Service: Vascular;  Laterality: Left;  . REVISION OF ARTERIOVENOUS GORETEX GRAFT Left 01/14/2016   Procedure: REVISION OF ARTERIOVENOUS GORETEX GRAFT;  Surgeon: Waynetta Sandy, MD;  Location: [place];  Service: Vascular;  Laterality: Left;  . REVISION OF ARTERIOVENOUS GORETEX GRAFT Left 02/29/2016   thigh/pt report  . REVISION OF ARTERIOVENOUS GORETEX GRAFT Left 02/29/2016   Procedure: POSSIBLE REVISION OF LEFT THIGH ARTERIOVENOUS GORETEX GRAFT;  Surgeon: Waynetta Sandy, MD;  Location: [place];  Service: Vascular;  Laterality: Left;  . TEE WITHOUT CARDIOVERSION N/A 12/14/2017   Procedure: TRANSESOPHAGEAL ECHOCARDIOGRAM (TEE);  Surgeon: Acie Fredrickson, Wonda Cheng, MD;  Location: [place];  Service: Cardiovascular;  Laterality: N/A;  . THROMBECTOMY AND REVISION OF ARTERIOVENTOUS (AV) GORETEX  GRAFT Left 06/12/2018   Procedure: Removal of infected left thigh graft;  Surgeon: Rosetta Posner, MD;  Location: [place];  Service: Vascular;  Laterality: Left;  . THROMBECTOMY W/ EMBOLECTOMY Left 01/14/2016   Procedure: THROMBECTOMY ARTERIOVENOUS GORE-TEX Left thigh GRAFT;  Surgeon: Waynetta Sandy, MD;  Location: [place];   Service: Vascular;  Laterality: Left;  . TONGUE SURGERY  ~ 1990   tongue-tie release   . ULTRASOUND GUIDANCE FOR VASCULAR ACCESS Right 08/07/2017   Procedure: ULTRASOUND GUIDANCE FOR VASCULAR CANNULATION RIGHT FEMORAL VEIN AND LEFT AV FEMORAL GRAFT;  Surgeon: Waynetta Sandy, MD;  Location: [place];  Service: Vascular;  Laterality: Right;  . UPPER EXTREMITY VENOGRAPHY Bilateral 08/09/2017   Procedure: UPPER EXTREMITY VENOGRAPHY;  Surgeon: Serafina Mitchell, MD;  Location: [place] [facility];  Service: Cardiovascular;  Laterality: Bilateral;  . VENOGRAM N/A 09/20/2017   Procedure: RIGHT COMMON FEMORAL ARTERY EXPLORATION.  CANNULATION RIGHT COMMON FEMORAL VEIN. VENOGRAM CENTRAL ULTRA SOUND GUIDED RIGHT FEMORAL VEIN TIMES TWO.;  Surgeon: Waynetta Sandy, MD;  Location: [place];  Service: Vascular;  Laterality: N/A;  . WOUND DEBRIDEMENT Left 02/29/2016   thigh    Home Medications:  (Not in a hospital admission)  Allergies:  Allergies  Allergen Reactions  . Hydrocodone Other (See Comments)  Caused involuntary movement and twitching. CANNOT TAKE DUE TO MUSCLE SPASMS AND MUSCLE TREMORS  . Furadantin [Nitrofurantoin] Other (See Comments)    UNSPECIFIED REACTION   . Mandelamine [Methenamine] Other (See Comments)    UNSPECIFIED REACTION   . Noroxin [Norfloxacin] Other (See Comments)    UNSPECIFIED REACTION   . Carmine Nausea Only  . Contrast Media [Iodinated Diagnostic Agents] Nausea And Vomiting    Oral dye causes vomiting, IV dye is okay  . Metrizamide Nausea And Vomiting    Oral dye causes vomiting, IV dye is okay  . Sulfa Antibiotics Other (See Comments) and Cough    Childhood reaction - pt could not confirm that it was a cough  . Sulfur Cough    Childhood reaction - pt could not confirm that it was a cough    Family History  Problem Relation Age of Onset  . Diabetes Mother   . Hypertension Mother   . Heart disease Mother        before age 9  . Diabetes Father   .  Heart attack Father        X's 3  . Diabetes Sister   . Bipolar disorder Sister    Social History:  reports that he has never smoked. He has never used smokeless tobacco. He reports previous alcohol use. He reports previous drug use. Drug: Marijuana.  ROS: A complete review of systems was performed.  All systems are negative except for pertinent findings as noted. Review of Systems  All other systems reviewed and are negative.    Physical Exam:  Vital signs in last 24 hours: Temp:  [99.1 F (37.3 C)] 99.1 F (37.3 C) (03/18 1326) Pulse Rate:  [72] 72 (03/18 1326) Resp:  [18] 18 (03/18 1326) BP: (133)/(67) 133/67 (03/18 1326) SpO2:  [100 %] 100 % (03/18 1326) Weight:  [119 kg] 119 kg (03/18 1321) General:  Alert and oriented, No acute distress HEENT: Normocephalic, atraumatic Cardiovascular: Regular rate and rhythm Lungs: Regular rate and effort Abdomen: Soft, nontender, nondistended, no abdominal masses Back: No CVA tenderness; left Nx in place but drain tubing and bag completely clotted/clogged off and very dirty Extremities: No edema Neurologic: Grossly intact  Laboratory Data:  Results for orders placed or performed during the hospital encounter of 03/20/19 (from the past 24 hour(s))  Comprehensive metabolic panel     Status: Abnormal   Collection Time: 03/20/19  2:00 PM  Result Value Ref Range   Sodium 138 135 - 145 mmol/L   Potassium 4.0 3.5 - 5.1 mmol/L   Chloride 95 (L) 98 - 111 mmol/L   CO2 26 22 - 32 mmol/L   Glucose, Bld 94 70 - 99 mg/dL   BUN 36 (H) 6 - 20 mg/dL   Creatinine, Ser 6.84 (H) 0.61 - 1.24 mg/dL   Calcium 9.3 8.9 - 10.3 mg/dL   Total Protein 8.6 (H) 6.5 - 8.1 g/dL   Albumin 3.4 (L) 3.5 - 5.0 g/dL   AST 13 (L) 15 - 41 U/L   ALT 9 0 - 44 U/L   Alkaline Phosphatase 79 38 - 126 U/L   Total Bilirubin 0.6 0.3 - 1.2 mg/dL   GFR calc non Af Amer 8 (L) >60 mL/min   GFR calc Af Amer 10 (L) >60 mL/min   Anion gap 17 (H) 5 - 15  CBC with Differential      Status: Abnormal   Collection Time: 03/20/19  2:00 PM  Result Value Ref Range  WBC 5.4 4.0 - 10.5 K/uL   RBC 3.84 (L) 4.22 - 5.81 MIL/uL   Hemoglobin 11.3 (L) 13.0 - 17.0 g/dL   HCT 37.6 (L) 39.0 - 52.0 %   MCV 97.9 80.0 - 100.0 fL   MCH 29.4 26.0 - 34.0 pg   MCHC 30.1 30.0 - 36.0 g/dL   RDW 14.6 11.5 - 15.5 %   Platelets 150 150 - 400 K/uL   nRBC 0.0 0.0 - 0.2 %   Neutrophils Relative % 80 %   Neutro Abs 4.4 1.7 - 7.7 K/uL   Lymphocytes Relative 7 %   Lymphs Abs 0.4 (L) 0.7 - 4.0 K/uL   Monocytes Relative 9 %   Monocytes Absolute 0.5 0.1 - 1.0 K/uL   Eosinophils Relative 2 %   Eosinophils Absolute 0.1 0.0 - 0.5 K/uL   Basophils Relative 1 %   Basophils Absolute 0.0 0.0 - 0.1 K/uL   Immature Granulocytes 1 %   Abs Immature Granulocytes 0.03 0.00 - 0.07 K/uL   No results found for this or any previous visit (from the past 240 hour(s)). Creatinine: Recent Labs    03/20/19 1400  CREATININE 6.84*    Impression/Assessment/plan:  Left renal, ureteral stones in patient with ileal conduit urinary diversion and left nephrostomy tube-the left nephrostomy tube is long overdue for exchange. This is in part because the patient delayed his procedure with Dr. Tresa Moore to allow Covid levels to fall. Dr. Tresa Moore will need a functioning nephrostomy tube to get access to the left kidney next week on Wednesday 03/26/2019 at [facility] for planned left antegrade ureteroscopy and PCNL. In order to minimize further delay, it would make the most sense to have medicine admit him today, have interventional radiology exchange of the left nephrostomy tube as it is clearly not functioning (I placed an order) and send a urine culture.  After nephrostomy exchange, the patient can then be dialyzed if needed and discharged for f/u next week for planned procedures with Dr. Tresa Moore at [facility] on 03/26/2019 as stated.   I will sign off, but please page GU on call with any questions or concerns. I will notify Dr.  Tresa Moore of admission.   Festus Aloe 03/20/2019, 5:57 PM

## 2019-03-21 ENCOUNTER — Inpatient Hospital Stay (HOSPITAL_COMMUNITY): Payer: Medicare Other

## 2019-03-21 DIAGNOSIS — T83098A Other mechanical complication of other indwelling urethral catheter, initial encounter: Secondary | ICD-10-CM

## 2019-03-21 HISTORY — PX: IR NEPHROSTOMY EXCHANGE LEFT: IMG6069

## 2019-03-21 LAB — RENAL FUNCTION PANEL
Albumin: 3 g/dL — ABNORMAL LOW (ref 3.5–5.0)
Anion gap: 16 — ABNORMAL HIGH (ref 5–15)
BUN: 43 mg/dL — ABNORMAL HIGH (ref 6–20)
CO2: 25 mmol/L (ref 22–32)
Calcium: 9.2 mg/dL (ref 8.9–10.3)
Chloride: 96 mmol/L — ABNORMAL LOW (ref 98–111)
Creatinine, Ser: 7.74 mg/dL — ABNORMAL HIGH (ref 0.61–1.24)
GFR calc Af Amer: 8 mL/min — ABNORMAL LOW (ref >60)
GFR calc non Af Amer: 7 mL/min — ABNORMAL LOW (ref >60)
Glucose, Bld: 81 mg/dL (ref 70–99)
Phosphorus: 5 mg/dL — ABNORMAL HIGH (ref 2.5–4.6)
Potassium: 4.7 mmol/L (ref 3.5–5.1)
Sodium: 137 mmol/L (ref 135–145)

## 2019-03-21 LAB — CBC
HCT: 31.8 % — ABNORMAL LOW (ref 39.0–52.0)
Hemoglobin: 10.3 g/dL — ABNORMAL LOW (ref 13.0–17.0)
MCH: 29.9 pg (ref 26.0–34.0)
MCHC: 32.4 g/dL (ref 30.0–36.0)
MCV: 92.4 fL (ref 80.0–100.0)
Platelets: 159 10*3/uL (ref 150–400)
RBC: 3.44 MIL/uL — ABNORMAL LOW (ref 4.22–5.81)
RDW: 14.6 % (ref 11.5–15.5)
WBC: 5 10*3/uL (ref 4.0–10.5)
nRBC: 0 % (ref 0.0–0.2)

## 2019-03-21 LAB — TYPE AND SCREEN
ABO/RH(D): A POS
Antibody Screen: NEGATIVE

## 2019-03-21 LAB — SARS CORONAVIRUS 2 (TAT 6-24 HRS): SARS Coronavirus 2: NEGATIVE

## 2019-03-21 LAB — TSH: TSH: 3.542 u[IU]/mL (ref 0.350–4.500)

## 2019-03-21 LAB — ABO/RH: ABO/RH(D): A POS

## 2019-03-21 LAB — MRSA PCR SCREENING: MRSA by PCR: NEGATIVE

## 2019-03-21 MED ORDER — LIDOCAINE HCL 1 % IJ SOLN
INTRAMUSCULAR | Status: AC
  Filled 2019-03-21: qty 20

## 2019-03-21 MED ORDER — IOHEXOL 300 MG/ML  SOLN
50.0000 mL | Freq: Once | INTRAMUSCULAR | Status: AC | PRN
  Administered 2019-03-21: 15 mL

## 2019-03-21 MED ORDER — MORPHINE SULFATE (PF) 2 MG/ML IV SOLN
INTRAVENOUS | Status: AC
  Administered 2019-03-21: 1 mg via INTRAVENOUS
  Filled 2019-03-21: qty 1

## 2019-03-21 MED ORDER — LIDOCAINE HCL 1 % IJ SOLN
INTRAMUSCULAR | Status: DC | PRN
  Administered 2019-03-21: 10 mL

## 2019-03-21 MED ORDER — CHLORHEXIDINE GLUCONATE CLOTH 2 % EX PADS
6.0000 | MEDICATED_PAD | Freq: Every day | CUTANEOUS | Status: DC
  Administered 2019-03-22 – 2019-03-23 (×2): 6 via TOPICAL

## 2019-03-21 MED ORDER — ALTEPLASE 2 MG IJ SOLR
INTRAMUSCULAR | Status: AC
  Administered 2019-03-21: 6 mg
  Filled 2019-03-21: qty 6

## 2019-03-21 MED ORDER — ONDANSETRON HCL 4 MG PO TABS
4.0000 mg | ORAL_TABLET | Freq: Once | ORAL | Status: AC
  Administered 2019-03-21: 4 mg via ORAL
  Filled 2019-03-21: qty 1

## 2019-03-21 MED ORDER — SODIUM ZIRCONIUM CYCLOSILICATE 10 G PO PACK: 10.0000 g | PACK | Freq: Every day | ORAL | Status: DC

## 2019-03-21 MED ORDER — ALTEPLASE 2 MG IJ SOLR
INTRAMUSCULAR | Status: AC
  Filled 2019-03-21: qty 6

## 2019-03-21 MED ORDER — FLUTICASONE PROPIONATE 50 MCG/ACT NA SUSP
1.0000 | Freq: Every day | NASAL | Status: DC
  Administered 2019-03-21 – 2019-03-23 (×3): 1 via NASAL
  Filled 2019-03-21: qty 16

## 2019-03-21 MED ORDER — HEPARIN SODIUM (PORCINE) 5000 UNIT/ML IJ SOLN
5000.0000 [IU] | Freq: Three times a day (TID) | INTRAMUSCULAR | Status: AC
  Administered 2019-03-21: 5000 [IU] via SUBCUTANEOUS
  Filled 2019-03-21: qty 1

## 2019-03-21 MED ORDER — HYDROMORPHONE HCL 1 MG/ML IJ SOLN
0.5000 mg | INTRAMUSCULAR | Status: AC | PRN
  Administered 2019-03-21: 0.5 mg via INTRAVENOUS
  Filled 2019-03-21: qty 1

## 2019-03-21 MED ORDER — PROCHLORPERAZINE EDISYLATE 10 MG/2ML IJ SOLN
10.0000 mg | Freq: Four times a day (QID) | INTRAMUSCULAR | Status: DC | PRN
  Administered 2019-03-21: 10 mg via INTRAVENOUS
  Filled 2019-03-21: qty 2

## 2019-03-21 MED ORDER — ALTEPLASE 2 MG IJ SOLR: 6.0000 mg | Freq: Once | INTRAMUSCULAR | Status: AC

## 2019-03-21 MED ORDER — CHLORHEXIDINE GLUCONATE CLOTH 2 % EX PADS
6.0000 | MEDICATED_PAD | Freq: Every day | CUTANEOUS | Status: DC
  Administered 2019-03-22 – 2019-03-24 (×3): 6 via TOPICAL

## 2019-03-21 MED ORDER — MORPHINE SULFATE (PF) 2 MG/ML IV SOLN
1.0000 mg | Freq: Once | INTRAVENOUS | Status: AC
  Administered 2019-03-21: 1 mg via INTRAVENOUS

## 2019-03-21 MED ORDER — MORPHINE SULFATE (PF) 2 MG/ML IV SOLN: 1.0000 mg | Freq: Once | INTRAVENOUS | Status: AC

## 2019-03-21 NOTE — Progress Notes (Signed)
New Admission Note: ? Arrival Method: via wheelchair Mental Orientation: A/O x 4 Telemetry: Assessment: Completed Skin: Refer to flowsheet IV: Right AC  Pain: 10/10  Tubes: Safety Measures: Safety Fall Prevention Plan discussed with patient. Admission: Completed [facility] [place] Orientation: Patient has been orientated to the room, unit and the staff.  Orders have been reviewed and are being implemented. Will continue to monitor the patient. Call light has been placed within reach and bed alarm has been activated.  ? [employer], [place]

## 2019-03-21 NOTE — Plan of Care (Signed)
  Problem: Education: Goal: Knowledge of General Education information will improve Description: Including pain rating scale, medication(s)/side effects and non-pharmacologic comfort measures Outcome: Progressing   Problem: Activity: Goal: Risk for activity intolerance will decrease Outcome: Progressing   Problem: Pain Managment: Goal: General experience of comfort will improve Outcome: Progressing   

## 2019-03-21 NOTE — Progress Notes (Signed)
Referring Physician(s): Dr. Junious Silk  Supervising Physician: Daryll Brod  Patient Status:  [name] - In-pt  Chief Complaint: Ureteral stones  Subjective: Patient in need of nephrostomy tube exchange.  [name] has a history of left ureteral and renal stones and was planning for stone retrieval in January which was delayed due to Covid pandemic.  [name] presented to the ED with dysfunction of his nephrostomy tube.   Patient presents to Radiology today for exchange.  [name] reports anxiety and pain in anticipation of procedure today.   Allergies: Hydrocodone, Furadantin [nitrofurantoin], Mandelamine [methenamine], Noroxin [norfloxacin], Carmine, Contrast media [iodinated diagnostic agents], Metrizamide, Sulfa antibiotics, and Sulfur  Medications: Prior to Admission medications   Medication Sig Start Date End Date Taking? Authorizing Provider  calcium acetate (PHOSLO) 667 MG capsule Take 3-4 capsules (2,001-2,668 mg total) by mouth See admin instructions. Take 2708 mg by mouth 3 times daily with meals, take 2001 mg by mouth with snacks Patient taking differently: Take 2,001-2,668 mg by mouth See admin instructions. Take 2,668 mg by mouth three times a day with meals and 2,001 mg with optional snacks 01/14/18  Yes Hennie Duos, MD  clopidogrel (PLAVIX) 75 MG tablet Take 1 tablet (75 mg total) by mouth daily. Patient taking differently: Take 75 mg by mouth at bedtime.  01/14/18  Yes Hennie Duos, MD  famotidine (PEPCID) 40 MG tablet Take 1 tablet (40 mg total) by mouth daily at 2 PM. Patient taking differently: Take 40 mg by mouth daily.  01/14/18  Yes Hennie Duos, MD  fentaNYL (DURAGESIC) 50 MCG/HR Place 1 patch onto the skin every 3 (three) days. 02/25/19  Yes [provider]  fluticasone (FLONASE) 50 MCG/ACT nasal spray Place 2 sprays into both nostrils as needed for allergies or rhinitis (congestion). Patient taking differently: Place 2 sprays into both nostrils as needed for  allergies or rhinitis (or congestion).  01/14/18  Yes Hennie Duos, MD  furosemide (LASIX) 80 MG tablet Take 80 mg by mouth 2 (two) times daily as needed (fluid).   Yes [provider]  levothyroxine (SYNTHROID, LEVOTHROID) 125 MCG tablet Take 1 tablet (125 mcg total) by mouth daily before breakfast. 01/14/18  Yes Hennie Duos, MD  lidocaine (LIDODERM) 5 % Place 2 patches onto the skin daily. 02/03/19  Yes [provider]  LORazepam (ATIVAN) 1 MG tablet Take 1 mg by mouth daily as needed (anxiety).    Yes [provider]  Nutritional Supplements (NOVASOURCE RENAL PO) Take 237 mLs by mouth daily.   Yes [provider]  oxyCODONE-acetaminophen (PERCOCET) 10-325 MG tablet Take 1 tablet by mouth every 6 (six) hours as needed for pain. 06/14/18  Yes Dagoberto Ligas, PA-C  promethazine (PHENERGAN) 25 MG tablet Take 1 tablet (25 mg total) by mouth 2 (two) times daily as needed for nausea or vomiting. Patient taking differently: Take 25 mg by mouth every 8 (eight) hours as needed for nausea or vomiting.  01/14/18  Yes Hennie Duos, MD  sodium polystyrene (KAYEXALATE) powder Take 15 g by mouth See admin instructions. Take 15 g (mixed in water) by mouth on Saturdays and Sundays 01/14/18  Yes Hennie Duos, MD  traZODone (DESYREL) 100 MG tablet Take 1 tablet (100 mg total) by mouth at bedtime. 01/14/18  Yes Hennie Duos, MD  zolpidem (AMBIEN) 10 MG tablet Take 1 tablet (10 mg total) by mouth at bedtime. 01/14/18  Yes Hennie Duos, MD     Vital Signs: BP  112/81 (BP Location: Left Arm)   Pulse 61   Temp 98.7 F (37.1 C) (Oral)   Resp 18   Ht 5\' 11"  (1.803 m)   Wt 262 lb 5.6 oz (119 kg)   SpO2 100%   BMI 36.59 kg/m   Physical Exam Vitals and nursing note reviewed.  Constitutional:      General: [name] is not in acute distress.    Appearance: [name] is not ill-appearing.  HENT:     Mouth/Throat:     Mouth: Mucous membranes are moist.    Cardiovascular:     Rate and Rhythm: Normal rate and regular rhythm.  Pulmonary:     Effort: Pulmonary effort is normal. No respiratory distress.     Breath sounds: Normal breath sounds.  Abdominal:     General: Abdomen is flat.     Palpations: Abdomen is soft.  Musculoskeletal:     Comments: L PCN in place  Skin:    General: Skin is warm and dry.  Neurological:     General: No focal deficit present.     Mental Status: [name] is alert and oriented to person, place, and time. Mental status is at baseline.  Psychiatric:        Mood and Affect: Mood normal.        Behavior: Behavior normal.        Thought Content: Thought content normal.        Judgment: Judgment normal.     Imaging: CT Renal Stone Study  Result Date: 03/20/2019 CLINICAL DATA:  Nephrostomy tube placement. Bleeding from nephrostomy tube with concern for malpositioning. EXAM: CT ABDOMEN AND PELVIS WITHOUT CONTRAST TECHNIQUE: Multidetector CT imaging of the abdomen and pelvis was performed following the standard protocol without IV contrast. COMPARISON:  December 01, 2018. FINDINGS: Lower chest: The lung bases are clear. The heart size is normal. Hepatobiliary: The liver is normal. Cholelithiasis without acute inflammation.There is no biliary ductal dilation. Pancreas: Normal contours without ductal dilatation. No peripancreatic fluid collection. Spleen: The spleen is enlarged measuring approximately 14 cm craniocaudad. Adrenals/Urinary Tract: --Adrenal glands: No adrenal hemorrhage. --Right kidney/ureter: The right kidney is atrophic with multiple nonobstructing stones measuring up to approximately 1.3 cm. --Left kidney/ureter: The left kidney is atrophic. There is a left-sided nephrostomy tube in place that appears to be well positioned. There is no hydronephrosis. Multiple simple and complex cysts are again noted. There are multiple nonobstructing stones. There is a punctate likely partially obstructing stone in the proximal  left ureter measuring approximately 2 mm. There is left-sided perinephric and paranephric fat stranding which is relatively similar across prior studies. --Urinary bladder: The bladder is decompressed and therefore is poorly evaluated. Stomach/Bowel: --Stomach/Duodenum: No hiatal hernia or other gastric abnormality. Normal duodenal course and caliber. --Small bowel: There is a right lower quadrant ostomy. There is a small peristomal hernia. This hernia contains fat, small bowel loops, and dilated mesenteric veins. --Colon: There is a large amount of stool in the colon. --Appendix: Not visualized. No right lower quadrant inflammation or free fluid. Vascular/Lymphatic: Atherosclerotic calcification is present within the non-aneurysmal abdominal aorta, without hemodynamically significant stenosis. There is a right femoral approach dialysis catheter in place. The dialysis catheter terminates at the level of the renal veins. 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 There is a left common iliac vein stent in place, the patency of which cannot be determined. There is a probable abandon left thigh AV graft. Collateral veins are noted coursing through the patient's retroperitoneal space. --  there are prominent retroperitoneal lymph nodes that are essentially stable from prior study. --No mesenteric lymphadenopathy. --No pelvic or inguinal lymphadenopathy. Reproductive: Unremarkable Other: No ascites or free air. The abdominal wall is normal. Musculoskeletal. Renal osteodystrophy is noted. There is no acute displaced fracture. IMPRESSION: 1. Well position left-sided nephrostomy tube without evidence for hydronephrosis. 2. Bilateral nonobstructing nephrolithiasis. There is a partially obstructing 2.5 mm stone in the proximal left ureter as before. 3. There is cholelithiasis without secondary signs of acute cholecystitis. 4. There are multiple additional chronic findings as detailed above. Aortic Atherosclerosis  (ICD10-I70.0). Electronically Signed   By: Constance Holster M.D.   On: 03/20/2019 15:34    Labs:  CBC: Recent Labs    12/09/18 0859 12/11/18 0849 03/20/19 1400 03/21/19 0543  WBC 10.4 8.6 5.4 5.0  HGB 8.0* 8.2* 11.3* 10.3*  HCT 26.1* 26.8* 37.6* 31.8*  PLT 219 189 150 159    COAGS: Recent Labs    11/26/18 0815 12/01/18 0501  INR 1.2 1.0  APTT  --  35    BMP: Recent Labs    12/09/18 1652 12/11/18 0902 03/20/19 1400 03/21/19 0543  NA 131* 133* 138 137  K 5.0 6.3* 4.0 4.7  CL 94* 93* 95* 96*  CO2 24 22 26 25   GLUCOSE 116* 111* 94 81  BUN 86* 111* 36* 43*  CALCIUM 8.7* 8.8* 9.3 9.2  CREATININE 12.38* 15.36* 6.84* 7.74*  GFRNONAA 4* 3* 8* 7*  GFRAA 5* 4* 10* 8*    LIVER FUNCTION TESTS: Recent Labs    06/12/18 1224 06/14/18 0742 11/26/18 0138 11/26/18 0138 11/27/18 0610 11/28/18 0031 12/09/18 1652 12/11/18 0902 03/20/19 1400 03/21/19 0543  BILITOT 0.8  --  1.1  --  2.1*  --   --   --  0.6  --   AST 15  --  15  --  26  --   --   --  13*  --   ALT 13  --  13  --  20  --   --   --  9  --   ALKPHOS 56  --  84  --  137*  --   --   --  79  --   PROT 9.0*  --  9.1*  --  8.2*  --   --   --  8.6*  --   ALBUMIN 3.9   < > 3.2*   < > 2.8*   < > 2.9* 2.5* 3.4* 3.0*   < > = values in this interval not displayed.    Assessment and Plan: L PCN placed 11/28/19 by Dr. Vernard Gambles due to ureteral and renal stones. Patient has plans for OR with Urology next week.  [name] is in need of PCN exchange due to poor function.  Plan to proceed with tube exchange in IR today with pain medication as needed.  [name] has received oral pain medication on the floor this AM, as well as his fentanyl patch was replaced yesterday.   Risks and benefits of PCN placement was discussed with the patient including, but not limited to, infection, bleeding, significant bleeding causing loss or decrease in renal function or damage to adjacent structures.   All of the patient's questions were answered,  patient is agreeable to proceed.  Consent signed and in chart.  Electronically Signed: Docia Barrier, PA 03/21/2019, 9:50 AM   I spent a total of 15 Minutes at the the patient's bedside AND on the patient's hospital floor  or unit, greater than 50% of which was counseling/coordinating care for renal stones.

## 2019-03-21 NOTE — Progress Notes (Signed)
Family Medicine Teaching Service Daily Progress Note Intern Pager: [phone]  Patient name: [name] Medical record number: [id number] Date of birth: 1965/03/21 Age: 54 y.o. Gender: male  Primary Care Provider: [name], MD Consultants: Urology, interventional radiology, nephrology Code Status: Full code  Pt Overview and Major Events to Date:  03/20/2019-patient admitted for nephrostomy dysfunction 3/19-patient had new nephrostomy tube placed per IR  Assessment and Plan: [name] is a 54 y.o. male presenting with pain at nephrostomy insertion site without drainage. PMH is significant for ESRD, renal colic and spina bifida.  Nephrostomy dysfunction, renal colic Nephrostomy tube was placed in 11/22/2018.  Urology was consulted yesterday and recommended IR change the nephrostomy tube and send urine for culture.  Urology has scheduled anterograde ureteroscopy with nephrolithotomy on 3/24 with Dr. Tresa Moore.  This procedure requires a functioning nephrostomy tube.  IR will see patient this morning and replace nephrostomy tube.  Overnight patient was afebrile but has been complaining of pain.  He has fentanyl patch in place and received 5 mg of oxycodone codon as well as 5-325 of Percocet this morning.  Patient is requesting more pain medications as well as Phenergan for nausea.  Patient has history of long QT so EKG was performed this morning to evaluate.  QT was 462 on this morning's EKG. -IR to take to the OR this morning -Vitals per floor routine -Up with assistance -N.p.o. until after procedure -Urology provided recommendations and have signed off, appreciate recommendations -Morning CBCs -Renal function panel -Follow-up on urine cultures -Monitor for signs of infection -Strict I's and O's -Pain control using 75 mcg fentanyl patch -As needed Percocet or oxycodone every 6 hours as needed for pain -1 dose of morphine given prior to IR procedure -1 dose of Dilaudid given  after IR procedure due to patient's continued severe pain -Zofran for nausea  ESRD Patient is a Monday Wednesday Friday dialysis patient at [facility].  Creatinine on admission was 6.84, BUN 36.  Hypothyroidism TSH on admission was 3.542.  Patient takes levothyroxine at home. -Continue levothyroxine  Normocytic anemia Hemoglobin this morning was 10.3.  Patient is on Plavix at home for unknown cause. -Continue home Plavix -Morning CBCs -Type and screen performed  GERD Patient takes Pepcid at home -Continue home medication  Insomnia Patient takes Ativan as needed, trazodone, Ambien at home for issues with sleep -Holding Ativan at this time -Continue trazodone -Continue decreased dose of Ambien 5 mg  FEN/GI: N.p.o. until after procedure, can have renal diet after PPx:   Disposition: Home pending nephrostomy tube placement as well as pain control  Subjective:  Patient reports he is in a lot of pain this morning.  Is ready to have this procedure done to hopefully mitigate some of the pain.  Is requesting stronger pain medicines as well as Phenergan for nausea.  Objective: Temp:  [98.7 F (37.1 C)-99.6 F (37.6 C)] 98.7 F (37.1 C) (03/19 0452) Pulse Rate:  [61-72] 61 (03/19 0452) Resp:  [18] 18 (03/19 0452) BP: (106-137)/(66-81) 112/81 (03/19 0452) SpO2:  [93 %-100 %] 100 % (03/19 0452) Weight:  [983 kg] 119 kg (03/18 1321) Physical Exam: General: Sitting up in bed looking uncomfortable. Cardiovascular: Regular rate and rhythm, systolic flow murmur, given echo from 2019 most likely mitral regurg Respiratory: Clear to auscultation bilaterally, no murmurs appreciated Abdomen: Urostomy tube in place, covered in bandage at this time.  Left sided pain Extremities: No edema noted  Laboratory: Recent Labs  Lab 03/20/19 1400 03/21/19 0543  WBC 5.4 5.0  HGB 11.3* 10.3*  HCT 37.6* 31.8*  PLT 150 159   Recent Labs  Lab 03/20/19 1400 03/21/19 0543  NA  138 137  K 4.0 4.7  CL 95* 96*  CO2 26 25  BUN 36* 43*  CREATININE 6.84* 7.74*  CALCIUM 9.3 9.2  PROT 8.6*  --   BILITOT 0.6  --   ALKPHOS 79  --   ALT 9  --   AST 13*  --   GLUCOSE 94 81      Imaging/Diagnostic Tests: CT Renal Stone Study  Result Date: 03/20/2019 CLINICAL DATA:  Nephrostomy tube placement. Bleeding from nephrostomy tube with concern for malpositioning. EXAM: CT ABDOMEN AND PELVIS WITHOUT CONTRAST TECHNIQUE: Multidetector CT imaging of the abdomen and pelvis was performed following the standard protocol without IV contrast. COMPARISON:  December 01, 2018. FINDINGS: Lower chest: The lung bases are clear. The heart size is normal. Hepatobiliary: The liver is normal. Cholelithiasis without acute inflammation.There is no biliary ductal dilation. Pancreas: Normal contours without ductal dilatation. No peripancreatic fluid collection. Spleen: The spleen is enlarged measuring approximately 14 cm craniocaudad. Adrenals/Urinary Tract: --Adrenal glands: No adrenal hemorrhage. --Right kidney/ureter: The right kidney is atrophic with multiple nonobstructing stones measuring up to approximately 1.3 cm. --Left kidney/ureter: The left kidney is atrophic. There is a left-sided nephrostomy tube in place that appears to be well positioned. There is no hydronephrosis. Multiple simple and complex cysts are again noted. There are multiple nonobstructing stones. There is a punctate likely partially obstructing stone in the proximal left ureter measuring approximately 2 mm. There is left-sided perinephric and paranephric fat stranding which is relatively similar across prior studies. --Urinary bladder: The bladder is decompressed and therefore is poorly evaluated. Stomach/Bowel: --Stomach/Duodenum: No hiatal hernia or other gastric abnormality. Normal duodenal course and caliber. --Small bowel: There is a right lower quadrant ostomy. There is a small peristomal hernia. This hernia contains fat, small  bowel loops, and dilated mesenteric veins. --Colon: There is a large amount of stool in the colon. --Appendix: Not visualized. No right lower quadrant inflammation or free fluid. Vascular/Lymphatic: Atherosclerotic calcification is present within the non-aneurysmal abdominal aorta, without hemodynamically significant stenosis. There is a right femoral approach dialysis catheter in place. The dialysis catheter terminates at the level of the renal veins. 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 There is a left common iliac vein stent in place, the patency of which cannot be determined. There is a probable abandon left thigh AV graft. Collateral veins are noted coursing through the patient's retroperitoneal space. --there are prominent retroperitoneal lymph nodes that are essentially stable from prior study. --No mesenteric lymphadenopathy. --No pelvic or inguinal lymphadenopathy. Reproductive: Unremarkable Other: No ascites or free air. The abdominal wall is normal. Musculoskeletal. Renal osteodystrophy is noted. There is no acute displaced fracture. IMPRESSION: 1. Well position left-sided nephrostomy tube without evidence for hydronephrosis. 2. Bilateral nonobstructing nephrolithiasis. There is a partially obstructing 2.5 mm stone in the proximal left ureter as before. 3. There is cholelithiasis without secondary signs of acute cholecystitis. 4. There are multiple additional chronic findings as detailed above. Aortic Atherosclerosis (ICD10-I70.0). Electronically Signed   By: Constance Holster M.D.   On: 03/20/2019 15:34   IR NEPHROSTOMY EXCHANGE LEFT  Result Date: 03/21/2019 INDICATION: Chronic left external nephrostomy, left hydroureteronephrosis, chronic stone disease, on hemodialysis. Existing left nephrostomy is occluded EXAM: Fluoroscopic exchange of the occluded left nephrostomy COMPARISON:  11/28/2018 MEDICATIONS: 1% lidocaine local ANESTHESIA/SEDATION: 2 mg morphine Moderate Sedation  Time:   None. The patient was continuously monitored during the procedure by the interventional radiology nurse under my direct supervision. CONTRAST:  50 cc Omnipaque 300-administered into the collecting system(s) FLUOROSCOPY TIME:  Fluoroscopy Time: 0 minutes 30 seconds (4 mGy). COMPLICATIONS: None immediate. PROCEDURE: Informed written consent was obtained from the patient after a thorough discussion of the procedural risks, benefits and alternatives. All questions were addressed. Maximal Sterile Barrier Technique was utilized including caps, mask, sterile gowns, sterile gloves, sterile drape, hand hygiene and skin antiseptic. A timeout was performed prior to the initiation of the procedure. Previous imaging reviewed. The existing occluded left nephrostomy catheter was cut and eventually removed over an Amplatz guidewire. Catheter exchanged for a new 10 French catheter. Retention loop formed the renal pelvis. Contrast injection confirms position. Thick exudative bloody fluid aspirated compatible with hydronephrosis. Catheter secured with a Prolene suture and connected to external gravity drainage bag. Sterile dressing applied. No immediate complication. Patient tolerated the procedure well. IMPRESSION: Successful fluoroscopic left nephrostomy exchange Electronically Signed   By: Jerilynn Mages.  Shick M.D.   On: 03/21/2019 11:27     Gifford Shave, MD 03/21/2019, 9:33 AM PGY-1, [place] Intern pager: [phone], text pages welcome

## 2019-03-21 NOTE — Consult Note (Signed)
Renal Service Consult Note [facility]  [name] 03/21/2019 [name] Requesting Physician:  Dr Ardelia Mems  Reason for Consult:  ESRD pt for nephrostomy exchange HPI: The patient is a 54 y.o. year-old with hx of spina bifida, ESRD on HD, hx urostomy/ left perc nephrostomy f/b urology, HTN, seizures and anemia admitted for exchange of L flank nephrostomy tube.  He is seen post procedure in HD.  HD cath not working, so TPA was placed. Brought back and cath still not working, Qb is 150 cc/hr.  Pt w/o any SOB, cough or edema. No c/o.    ROS  denies CP  no joint pain   no HA  no blurry vision  no rash  no diarrhea  no nausea/ vomiting     Past Medical History  Past Medical History:  Diagnosis Date  . Anemia   . Anxiety   . Constipation   . End stage renal failure on dialysis [facility])    [name]; MWF, on HD since 1997, exhausted upper ext access, and possibly LE access; cath dependent in R groin as of 06/2018  . GERD (gastroesophageal reflux disease)   . Grand mal seizure ([place]) X 4   last 1998 (02/29/2016)  . Heart murmur    no problems with per pt. - nephrologist and Dr. Coletta Memos  . History of blood transfusion 2000s   "I was having blood loss; never found out from where"  . History of kidney stones   . Hypertension    hx of - has not taken bp meds in over 2 years  since dialysis  . Hypothyroidism   . Insomnia   . Migraine    "due to my BP in my late 1990s; none since" (02/29/2016)  . Nonhealing surgical wound    of the left arteriovenous graft  . Pneumonia    x 2  . Spina bifida [facility])    Past Surgical History  Past Surgical History:  Procedure Laterality Date  . AMPUTATION Left 12/11/2017   Procedure: Left Great Toe Amputation;  Surgeon: [name], MD;  Location: [place];  Service: Orthopedics;  Laterality: Left;  . APPENDECTOMY    . ARTERIOVENOUS GRAFT PLACEMENT Left 10/16/2012   left femoral goretex graft         Dr Donnetta Hutching  . AV FISTULA  PLACEMENT Left 06/27/2012   Procedure: EXPLORATORY LEFT THY-GRAFT PSEUDO-ANEURYSM;  Surgeon: Conrad [place] Creek, MD;  Location: [place];  Service: Vascular;  Laterality: Left;  Revision of left Arteriovenus gortex graft in thigh.  . COLONOSCOPY    . FLEXIBLE SIGMOIDOSCOPY N/A 12/03/2018   Procedure: FLEXIBLE SIGMOIDOSCOPY;  Surgeon: Yetta Flock, MD;  Location: [place];  Service: Gastroenterology;  Laterality: N/A;  . I & D EXTREMITY Left 02/29/2016   Procedure: DEBRIDEMENT LEFT THIGH WOUND;  Surgeon: Waynetta Sandy, MD;  Location: [place];  Service: Vascular;  Laterality: Left;  . ILEOSTOMY  1970s  . INCISION AND DRAINAGE Left 10/16/2012   Procedure: INCISION AND Debridement left thigh graft;  Surgeon: Rosetta Posner, MD;  Location: [place];  Service: Vascular;  Laterality: Left;  . INSERTION OF DIALYSIS CATHETER    . INSERTION OF DIALYSIS CATHETER  01/14/2016   Procedure: INSERTION OF DIALYSIS CATHETER;  Surgeon: Waynetta Sandy, MD;  Location: [place];  Service: Vascular;;  . INSERTION OF DIALYSIS CATHETER Right 08/07/2017   Procedure: INSERTION OF DIALYSIS CATHETER;  Surgeon: Waynetta Sandy, MD;  Location: [place];  Service:  Vascular;  Laterality: Right;  . INSERTION OF DIALYSIS CATHETER Right 12/21/2017   Procedure: RIGHT FEMORAL DIALYSIS CATHETER EXCHANGE;  Surgeon: Marty Heck, MD;  Location: [place];  Service: Vascular;  Laterality: Right;  . INSERTION OF ILIAC STENT Left 01/14/2016   Procedure: INSERTION OF ILIAC STENT;  Surgeon: Waynetta Sandy, MD;  Location: [place];  Service: Vascular;  Laterality: Left;  . INTRAOPERATIVE ARTERIOGRAM Left 01/14/2016   Procedure: INTRA OPERATIVE ARTERIOGRAM;  Surgeon: Waynetta Sandy, MD;  Location: [place];  Service: Vascular;  Laterality: Left;  . IR NEPHROSTOMY EXCHANGE LEFT  03/21/2019  . IR NEPHROSTOMY PLACEMENT LEFT  11/28/2018  . LITHOTRIPSY  x 3   "& a laser treatment" (02/29/2016)  . NEPHROSTOMY  Bilateral 1968  . PATELLAR TENDON REPAIR Right 1990s   "big incision"  . PERIPHERAL VASCULAR CATHETERIZATION  01/14/2016   Procedure: A/V SHUNTOGRAM;  Surgeon: Waynetta Sandy, MD;  Location: [place];  Service: Vascular;;  . REVISION OF ARTERIOVENOUS GORETEX GRAFT Left 10/16/2012   Procedure: REVISION OF LEFT FEMORAL LOOP ARTERIOVENOUS GORETEX GRAFT;  Surgeon: Rosetta Posner, MD;  Location: [place];  Service: Vascular;  Laterality: Left;  . REVISION OF ARTERIOVENOUS GORETEX GRAFT Left 02/11/2015   Procedure: EXCISION OF SMALL SEGMENT OF EXPOSED LEFT THIGH NON FUNCTIONING  ARTERIOVENOUS GORETEX GRAFT;  Surgeon: Mal Misty, MD;  Location: [place];  Service: Vascular;  Laterality: Left;  . REVISION OF ARTERIOVENOUS GORETEX GRAFT Left 01/14/2016   Procedure: REVISION OF ARTERIOVENOUS GORETEX GRAFT;  Surgeon: Waynetta Sandy, MD;  Location: [place];  Service: Vascular;  Laterality: Left;  . REVISION OF ARTERIOVENOUS GORETEX GRAFT Left 02/29/2016   thigh/pt report  . REVISION OF ARTERIOVENOUS GORETEX GRAFT Left 02/29/2016   Procedure: POSSIBLE REVISION OF LEFT THIGH ARTERIOVENOUS GORETEX GRAFT;  Surgeon: Waynetta Sandy, MD;  Location: [place];  Service: Vascular;  Laterality: Left;  . TEE WITHOUT CARDIOVERSION N/A 12/14/2017   Procedure: TRANSESOPHAGEAL ECHOCARDIOGRAM (TEE);  Surgeon: Acie Fredrickson, Wonda Cheng, MD;  Location: [place];  Service: Cardiovascular;  Laterality: N/A;  . THROMBECTOMY AND REVISION OF ARTERIOVENTOUS (AV) GORETEX  GRAFT Left 06/12/2018   Procedure: Removal of infected left thigh graft;  Surgeon: Rosetta Posner, MD;  Location: [place];  Service: Vascular;  Laterality: Left;  . THROMBECTOMY W/ EMBOLECTOMY Left 01/14/2016   Procedure: THROMBECTOMY ARTERIOVENOUS GORE-TEX Left thigh GRAFT;  Surgeon: Waynetta Sandy, MD;  Location: [place];  Service: Vascular;  Laterality: Left;  . TONGUE SURGERY  ~ 1990   tongue-tie release   . ULTRASOUND GUIDANCE FOR VASCULAR ACCESS  Right 08/07/2017   Procedure: ULTRASOUND GUIDANCE FOR VASCULAR CANNULATION RIGHT FEMORAL VEIN AND LEFT AV FEMORAL GRAFT;  Surgeon: Waynetta Sandy, MD;  Location: [place];  Service: Vascular;  Laterality: Right;  . UPPER EXTREMITY VENOGRAPHY Bilateral 08/09/2017   Procedure: UPPER EXTREMITY VENOGRAPHY;  Surgeon: Serafina Mitchell, MD;  Location: [place] [facility];  Service: Cardiovascular;  Laterality: Bilateral;  . VENOGRAM N/A 09/20/2017   Procedure: RIGHT COMMON FEMORAL ARTERY EXPLORATION.  CANNULATION RIGHT COMMON FEMORAL VEIN. VENOGRAM CENTRAL ULTRA SOUND GUIDED RIGHT FEMORAL VEIN TIMES TWO.;  Surgeon: Waynetta Sandy, MD;  Location: [facility];  Service: Vascular;  Laterality: N/A;  . WOUND DEBRIDEMENT Left 02/29/2016   thigh   Family History  Family History  Problem Relation Age of Onset  . Diabetes Mother   . Hypertension Mother   . Heart disease Mother        before  age 26  . Diabetes Father   . Heart attack Father        X's 3  . Diabetes Sister   . Bipolar disorder Sister    Social History  reports that he has never smoked. He has never used smokeless tobacco. He reports previous alcohol use. He reports previous drug use. Drug: Marijuana. Allergies  Allergies  Allergen Reactions  . Hydrocodone Other (See Comments)    Caused involuntary movement and twitching. CANNOT TAKE DUE TO MUSCLE SPASMS AND MUSCLE TREMORS  . Furadantin [Nitrofurantoin] Other (See Comments)    UNSPECIFIED REACTION   . Mandelamine [Methenamine] Other (See Comments)    UNSPECIFIED REACTION   . Noroxin [Norfloxacin] Other (See Comments)    UNSPECIFIED REACTION   . [place] Nausea Only  . Contrast Media [Iodinated Diagnostic Agents] Nausea And Vomiting    Oral dye causes vomiting, IV dye is okay  . Metrizamide Nausea And Vomiting    Oral dye causes vomiting, IV dye is okay  . Sulfa Antibiotics Other (See Comments) and Cough    Childhood reaction - pt could not confirm that it was a cough  .  Sulfur Cough    Childhood reaction - pt could not confirm that it was a cough   Home medications Prior to Admission medications   Medication Sig Start Date End Date Taking? Authorizing Provider  calcium acetate (PHOSLO) 667 MG capsule Take 3-4 capsules (2,001-2,668 mg total) by mouth See admin instructions. Take 2708 mg by mouth 3 times daily with meals, take 2001 mg by mouth with snacks Patient taking differently: Take 2,001-2,668 mg by mouth See admin instructions. Take 2,668 mg by mouth three times a day with meals and 2,001 mg with optional snacks 01/14/18  Yes Hennie Duos, MD  clopidogrel (PLAVIX) 75 MG tablet Take 1 tablet (75 mg total) by mouth daily. Patient taking differently: Take 75 mg by mouth at bedtime.  01/14/18  Yes Hennie Duos, MD  famotidine (PEPCID) 40 MG tablet Take 1 tablet (40 mg total) by mouth daily at 2 PM. Patient taking differently: Take 40 mg by mouth daily.  01/14/18  Yes Hennie Duos, MD  fentaNYL (DURAGESIC) 50 MCG/HR Place 1 patch onto the skin every 3 (three) days. 02/25/19  Yes [provider]  fluticasone (FLONASE) 50 MCG/ACT nasal spray Place 2 sprays into both nostrils as needed for allergies or rhinitis (congestion). Patient taking differently: Place 2 sprays into both nostrils as needed for allergies or rhinitis (or congestion).  01/14/18  Yes Hennie Duos, MD  furosemide (LASIX) 80 MG tablet Take 80 mg by mouth 2 (two) times daily as needed (fluid).   Yes [provider]  levothyroxine (SYNTHROID, LEVOTHROID) 125 MCG tablet Take 1 tablet (125 mcg total) by mouth daily before breakfast. 01/14/18  Yes Hennie Duos, MD  lidocaine (LIDODERM) 5 % Place 2 patches onto the skin daily. 02/03/19  Yes [provider]  LORazepam (ATIVAN) 1 MG tablet Take 1 mg by mouth daily as needed (anxiety).    Yes [provider]  Nutritional Supplements (NOVASOURCE RENAL PO) Take 237 mLs by mouth daily.   Yes [provider]  oxyCODONE-acetaminophen (PERCOCET) 10-325 MG tablet Take 1 tablet by mouth every 6 (six) hours as needed for pain. 06/14/18  Yes Dagoberto Ligas, PA-C  promethazine (PHENERGAN) 25 MG tablet Take 1 tablet (25 mg total) by mouth 2 (two) times daily as needed for nausea or vomiting. Patient taking differently: Take  25 mg by mouth every 8 (eight) hours as needed for nausea or vomiting.  01/14/18  Yes Hennie Duos, MD  sodium polystyrene (KAYEXALATE) powder Take 15 g by mouth See admin instructions. Take 15 g (mixed in water) by mouth on Saturdays and Sundays 01/14/18  Yes Hennie Duos, MD  traZODone (DESYREL) 100 MG tablet Take 1 tablet (100 mg total) by mouth at bedtime. 01/14/18  Yes Hennie Duos, MD  zolpidem (AMBIEN) 10 MG tablet Take 1 tablet (10 mg total) by mouth at bedtime. 01/14/18  Yes Hennie Duos, MD     Vitals:   03/21/19 0012 03/21/19 0452 03/21/19 1415 03/21/19 1612  BP: 106/66 112/81 (!) 153/79 122/74  Pulse: 66 61 (!) 55 66  Resp: 18 18 17 16   Temp: 99.2 F (37.3 C) 98.7 F (37.1 C) 98.3 F (36.8 C) 99.2 F (37.3 C)  TempSrc: Oral Oral Oral Oral  SpO2: 93% 100% 99% 97%  Weight:   118 kg   Height:       Exam Gen alert, no distress No rash, cyanosis or gangrene Sclera anicteric, throat clear  No jvd or bruits Chest clear bilat to bases RRR no MRG Abd soft ntnd no mass or ascites +bs, L flank nephro tube draining thick bloody fluid; RLQ urostomy MS no joint effusions or deformity Ext no sig LE or UE edema, no wounds or ulcers Neuro is alert, Ox 3 , nf R groin TDC    Home meds:  - phoslo ac tid/ kayexalate 15 gm on Sat and Sun  - plavix 75 qd  - pepcid 20 qd  - duragesic 50 ug per hr/ ativan 1mg  prn/ oxy- aceta prn qid  - trazodone 100 hs  - lasix 80 bid prn  - synthroid 125 ug qd  - prn's/ vitamins/ supplements    Outpt HD: MWF SW  4h 78min  350/800  2/2.25  Hep 3700  TDC R fem  - meds pending  - edw  pend     Assessment/ Plan: 1. SP nephrostomy exchange - per urology 2. HD access - thigh cath [facility] not functioning well this am, TPA tried w/o improvement. Consulted [facility], see their notes. Will need lumbar cath consult w/ IR if current cath can not be salvaged.  Will TPA cath overnight and re-try HD in am.  Will consult IR to be aware of patient may need help to try new access (lumbar, re-try exchange) if this cath fails. Give kayexalate 15 gm Sat and Sun as at home. HD 1st shift am.  3. HTN/volume - off meds , close to dry wt 4. SP urostomy 5. Spina bifida 6. Anemia cdk - get records 7. MBD ckd - get records      [name]  MD 03/21/2019, 5:15 PM  Recent Labs  Lab 03/20/19 1400 03/21/19 0543  WBC 5.4 5.0  HGB 11.3* 10.3*   Recent Labs  Lab 03/20/19 1400 03/21/19 0543  K 4.0 4.7  BUN 36* 43*  CREATININE 6.84* 7.74*  CALCIUM 9.3 9.2  PHOS  --  5.0*

## 2019-03-21 NOTE — Progress Notes (Signed)
PIV consult: Arrived to room, pt off the unit. Please re-enter consult when back in room.

## 2019-03-21 NOTE — Progress Notes (Signed)
FPTS Interim Progress Note  Patient requesting phenergan for nausea. Last EKG 12/2017 with borderline prolonged QTc at 470ms.   -repeat ekg  -prochlorperazine 10mg  q6-8h PRN nausea  Gladys Damme, MD 03/21/2019, 6:41 AM PGY-1, [place] Medicine Service pager [phone]

## 2019-03-21 NOTE — Plan of Care (Signed)
  Problem: Education: Goal: Knowledge of General Education information will improve Description: Including pain rating scale, medication(s)/side effects and non-pharmacologic comfort measures Outcome: Progressing  Patient asking MD for information on translumbar catheter versus femoral catheter exchange.  Problem: Clinical Measurements: Goal: Will remain free from infection Outcome: Progressing  Nephrostomy tube replaced, small amount of serosanguinous drainage, no fever noted.

## 2019-03-21 NOTE — Progress Notes (Signed)
Initial Nutrition Assessment  DOCUMENTATION CODES:   Obesity unspecified  INTERVENTION:   -RD will follow for diet advancement and add supplements as appropriate  NUTRITION DIAGNOSIS:   Increased nutrient needs related to chronic illness(ESRD on HD) as evidenced by estimated needs.  GOAL:   Patient will meet greater than or equal to 90% of their needs  MONITOR:   Diet advancement, Labs, Weight trends, Skin, I & O's  REASON FOR ASSESSMENT:   Malnutrition Screening Tool    ASSESSMENT:   [name] is a 54 y.o. male presenting with pain at nephrostomy insertion site without drainage. PMH is significant for ESRD, renal colic and spina bifida.  Pt admitted with left flank plain and nephrostomy tube dysfunction.   Reviewed I/O's: 0 ml x 24 hours  Attempted to evaluate pt x 4, however, pt either receiving nursing care or in HD suite at time of visit. RD unable to obtain further nutrition-related history at this time.   Pt NPO for nephrostomy tube exchange. No meal intake recorded. Per chart review, pt complaining of nausea.   Reviewed wt hx; wt has been stable over the past 3 months.   Labs reviewed.   Diet Order:   Diet Order            Diet NPO time specified  Diet effective now              EDUCATION NEEDS:   No education needs have been identified at this time  Skin:  Skin Assessment: Reviewed RN Assessment  Last BM:  Unknown (pt with ileostomy)  Height:   Ht Readings from Last 1 Encounters:  03/20/19 5\' 11"  (1.803 m)    Weight:   Wt Readings from Last 1 Encounters:  03/20/19 119 kg    Ideal Body Weight:  78.2 kg  BMI:  Body mass index is 36.59 kg/m.  Estimated Nutritional Needs:   Kcal:  2150-2350  Protein:  115-130 grams  Fluid:  1000 ml + UOP    Loistine Chance, RD, LDN, [place] Registered Dietitian II Certified Diabetes Care and Education Specialist Please refer to [facility] for RD and/or RD on-call/weekend/after hours pager

## 2019-03-21 NOTE — Progress Notes (Signed)
Pt noted in HD, upon initiation of HD tx, pt noted with resistance with flushing and aspiration of venous cath line. Pt noted with cathflo used for cath lock in between treatments. Cathflo aspirated after several attempts and reattempt was made with resistance still in response. Dr. Jonnie Finner notified and new order for cathflo to be administered and attempt again. Cathflo administered, attempt made approximately 25 min later and cath flushed with slight difficulty, however able to flush and aspirate. Tx initated at beginning low rate, high pressures noted. Blood lines reversed, as pt usually runs reversed at his center, high pressures still continues. Pt repositioned several times, with no success. Dr. Jonnie Finner notified and patient will follow up with vascular for further intervention. HD cath locked with cathflo at this time and returned to his room.

## 2019-03-21 NOTE — Progress Notes (Signed)
Called by Renal service regarding poor function of HD catheter.  Pt has essentially exhausted access options.  According to Dr Ainsley Spinner last op note next step would be translumbar catheter.  He has no other options from our standpoint.  Ruta Hinds, MD [facility] Office: [phone]

## 2019-03-22 LAB — BASIC METABOLIC PANEL
Anion gap: 16 — ABNORMAL HIGH (ref 5–15)
BUN: 57 mg/dL — ABNORMAL HIGH (ref 6–20)
CO2: 27 mmol/L (ref 22–32)
Calcium: 9.3 mg/dL (ref 8.9–10.3)
Chloride: 94 mmol/L — ABNORMAL LOW (ref 98–111)
Creatinine, Ser: 9.81 mg/dL — ABNORMAL HIGH (ref 0.61–1.24)
GFR calc Af Amer: 6 mL/min — ABNORMAL LOW (ref >60)
GFR calc non Af Amer: 5 mL/min — ABNORMAL LOW (ref >60)
Glucose, Bld: 85 mg/dL (ref 70–99)
Potassium: 3.9 mmol/L (ref 3.5–5.1)
Sodium: 137 mmol/L (ref 135–145)

## 2019-03-22 LAB — CBC
HCT: 32.3 % — ABNORMAL LOW (ref 39.0–52.0)
Hemoglobin: 10.1 g/dL — ABNORMAL LOW (ref 13.0–17.0)
MCH: 29.4 pg (ref 26.0–34.0)
MCHC: 31.3 g/dL (ref 30.0–36.0)
MCV: 94.2 fL (ref 80.0–100.0)
Platelets: 154 10*3/uL (ref 150–400)
RBC: 3.43 MIL/uL — ABNORMAL LOW (ref 4.22–5.81)
RDW: 14.5 % (ref 11.5–15.5)
WBC: 4.7 10*3/uL (ref 4.0–10.5)
nRBC: 0 % (ref 0.0–0.2)

## 2019-03-22 MED ORDER — HEPARIN SODIUM (PORCINE) 1000 UNIT/ML IJ SOLN
INTRAMUSCULAR | Status: AC
  Filled 2019-03-22: qty 8

## 2019-03-22 MED ORDER — PROMETHAZINE HCL 25 MG/ML IJ SOLN
INTRAMUSCULAR | Status: AC
  Filled 2019-03-22: qty 1

## 2019-03-22 MED ORDER — PROMETHAZINE HCL 25 MG/ML IJ SOLN
12.5000 mg | Freq: Four times a day (QID) | INTRAMUSCULAR | Status: AC | PRN
  Administered 2019-03-22 – 2019-03-23 (×2): 12.5 mg via INTRAVENOUS
  Filled 2019-03-22: qty 1

## 2019-03-22 MED ORDER — ALTEPLASE 2 MG IJ SOLR
INTRAMUSCULAR | Status: AC
  Administered 2019-03-22: 4 mg
  Filled 2019-03-22: qty 4

## 2019-03-22 NOTE — Progress Notes (Addendum)
[facility] Progress Note  Subjective: HD cath working today , pt seen on HD BFR 275 cc/hr  Vitals:   03/22/19 1400 03/22/19 1430 03/22/19 1453 03/22/19 1510  BP: (!) 102/55 (!) 102/56 132/69 138/78  Pulse: (!) 55 (!) 59 63 64  Resp:   17 18  Temp:   97.9 F (36.6 C) 99 F (37.2 C)  TempSrc:   Oral Oral  SpO2:    100%  Weight:   121.7 kg   Height:        Exam: Gen alert, no distress No jvd or bruits Chest clear bilat to bases RRR no MRG Abd soft ntnd no mass or ascites +bs, L flank nephro tube draining thick bloody fluid; RLQ urostomy Ext no LE edema Neuro is alert, Ox 3 , nf R groin TDC    Home meds:  - phoslo ac tid/ kayexalate 15 gm on Sat and Sun  - plavix 75 qd  - pepcid 20 qd  - duragesic 50 ug per hr/ ativan 1mg  prn/ oxy- aceta prn qid  - trazodone 100 hs  - lasix 80 bid prn  - synthroid 125 ug qd  - prn's/ vitamins/ supplements    Outpt HD: MWF SW  4h 49min  350/800 119.5kg   2/2.25  Hep 3700  TDC R fem  - no esa , last mircera 60 ug on 03/03/19  - senispar 30  - hect 4 ug      Assessment/ Plan: 1. SP nephrostomy exchange - per urology 2. HD access - thigh cath [facility] not functioning well yest on Friday. Consulted VVS, they have no further options, suggesting TL cath per IR if needed.  TPA tried w/o improvement yest. TPA'd again overnight and cath is working reasonably well this am (BFR 275 cc/hr). Will plan to keep pt for HD on Monday to see if cath still working. Cont to instill TPA after each HD. IR consulted for Monday in case TDC not functional.  3. HTN/volume - off meds , close to dry wt 4. SP urostomy 5. Spina bifida 6. Anemia cdk - get records 7. MBD ckd - get records       Rob Kaylanie Capili 03/22/2019, 3:24 PM   Recent Labs  Lab 03/21/19 0543 03/22/19 0437  K 4.7 3.9  BUN 43* 57*  CREATININE 7.74* 9.81*  CALCIUM 9.2 9.3  PHOS 5.0*  --   HGB 10.3* 10.1*   Inpatient medications: . calcium acetate  2,001 mg  Oral With snacks  . calcium acetate  2,668 mg Oral TID WC  . Chlorhexidine Gluconate Cloth  6 each Topical Daily  . Chlorhexidine Gluconate Cloth  6 each Topical Q0600  . famotidine  20 mg Oral Daily  . fentaNYL  1 patch Transdermal Q3 days  . fluticasone  1 spray Each Nare Daily  . heparin      . levothyroxine  125 mcg Oral QAC breakfast  . sodium polystyrene  15 g Oral Once per day on Sun Sat  . traZODone  100 mg Oral QHS    lidocaine, oxyCODONE-acetaminophen **AND** oxyCODONE, promethazine, zolpidem

## 2019-03-22 NOTE — Plan of Care (Signed)
  Problem: Health Behavior/Discharge Planning: Goal: Ability to manage health-related needs will improve Outcome: Progressing   

## 2019-03-22 NOTE — Progress Notes (Signed)
Family Medicine Teaching Service Daily Progress Note Intern Pager: [phone]  Patient name: [name] Medical record number: [id number] Date of birth: Aug 06, 1965 Age: 54 y.o. Gender: male  Primary Care Provider: [name], MD Consultants: Urology, interventional radiology, nephrology Code Status: Full code  Pt Overview and Major Events to Date:  03/20/2019-patient admitted for nephrostomy dysfunction 3/19-patient had new nephrostomy tube placed per IR.  HD access thrombosed, TPA given (expect re-attempt 3/20)  Assessment and Plan: [name] is a 54 y.o. male presenting with pain at nephrostomy insertion site without drainage. PMH is significant for ESRD, renal colic and spina bifida.  Nephrostomy dysfunction, renal colic Nephrostomy tube was placed in 11/22/2018.  Urology was consulted yesterday and recommended IR change the nephrostomy tube and send urine for culture.  Urology has scheduled anterograde ureteroscopy with nephrolithotomy on 3/24 with Dr. Tresa Moore.  This procedure requires a functioning nephrostomy tube Which has been placed.  Pain control is fentanyl patch in place and received 5 mg of oxycodone codon as well as 5-325 of Percocet this morning.   Patient has history of long QT so EKG was performed this morning to evaluate.  QT was 462 on EKG. -IR placed nephrostomy tube -Vitals per floor routine -Up with assistance -N.p.o. until after procedure -Urology provided recommendations and have signed off, appreciate recommendations -Morning CBCs -Renal function panel -Follow-up on urine cultures -Monitor for signs of infection -Strict I's and O's -Pain control using 75 mcg fentanyl patch -As needed Percocet or oxycodone every 6 hours as needed for pain -1 dose of morphine given prior to IR procedure -1 dose of Dilaudid given after IR procedure due to patient's continued severe pain -Zofran for nausea  ESRD - HD acess to be re-attempted AM 3/20, vascular has  said no other access options beyond lumbar so IR will be needed if this fails Patient is a Monday Wednesday Friday dialysis patient at [facility].    Hypothyroidism-stable TSH on admission was 3.542.  Patient takes levothyroxine at home. -Continue levothyroxine  Normocytic anemia - stable Patient is on Plavix at home for unknown cause. -Continue home Plavix -Morning CBCs -Type and screen performed  GERD- stable Patient takes Pepcid at home -Continue home medication  Insomnia- stable Patient takes Ativan as needed, trazodone, Ambien at home for issues with sleep -Holding Ativan at this time -Continue trazodone -Continue decreased dose of Ambien 5 mg  FEN/GI: renal diet PPx: expect to go with heparin after procedures completed  Disposition: pending HD re-attempt today, (then home vs potential lumbar access)  Subjective:  Feeling comfortable this morning, optimistic about HD attempt today.  Says he has been rhythmically flexing his leg and believes he has improved flow.  He states he thinks HD was stopped too soon the other day and thinks he had some flow then, says he has discussed this w/ Dr. Melvia Heaps.  He is acutely aware that lumbar access would likely be his last shot at life if this access point fails.  Aware his life expectancy is likely <97yrs.  He is very appreciative of the honest prognosis given by nephro/vascular.  Says he was told there might be other facilities that would attempt anterior abdominal approach to other access points (I told him I had no knowledge in this area but that he was within rights to call other facilities to verify)  He also said his oral contrast dye intolerance in chart triggered a ban on peach cobbler and would like that resolved (I called dietary and  added a note in his allergy list)   Objective: Temp:  [98.2 F (36.8 C)-99.2 F (37.3 C)] 98.2 F (36.8 C) (03/20 0513) Pulse Rate:  [55-66] 63 (03/20 0513) Resp:  [16-18] 18 (03/20  0513) BP: (122-153)/(69-87) 144/76 (03/20 0513) SpO2:  [93 %-99 %] 98 % (03/20 0513) Weight:  [161 kg] 118 kg (03/19 1415) Physical Exam:  General: Sitting up in bed, no acute distress Cardiovascular: RRR, systolic murmur, fem access without bleeding Respiratory: Clear to auscultation bilaterally, no murmurs appreciated Abdomen: Urostomy tube in place, covered in bandage at this time.  Slight serosanguinous fluid in bag Extremities: No edema noted  Laboratory: Recent Labs  Lab 03/20/19 1400 03/21/19 0543 03/22/19 0437  WBC 5.4 5.0 4.7  HGB 11.3* 10.3* 10.1*  HCT 37.6* 31.8* 32.3*  PLT 150 159 154   Recent Labs  Lab 03/20/19 1400 03/21/19 0543 03/22/19 0437  NA 138 137 137  K 4.0 4.7 3.9  CL 95* 96* 94*  CO2 26 25 27   BUN 36* 43* 57*  CREATININE 6.84* 7.74* 9.81*  CALCIUM 9.3 9.2 9.3  PROT 8.6*  --   --   BILITOT 0.6  --   --   ALKPHOS 79  --   --   ALT 9  --   --   AST 13*  --   --   GLUCOSE 94 81 85      Imaging/Diagnostic Tests: IR NEPHROSTOMY EXCHANGE LEFT  Result Date: 03/21/2019 INDICATION: Chronic left external nephrostomy, left hydroureteronephrosis, chronic stone disease, on hemodialysis. Existing left nephrostomy is occluded EXAM: Fluoroscopic exchange of the occluded left nephrostomy COMPARISON:  11/28/2018 MEDICATIONS: 1% lidocaine local ANESTHESIA/SEDATION: 2 mg morphine Moderate Sedation Time:  None. The patient was continuously monitored during the procedure by the interventional radiology nurse under my direct supervision. CONTRAST:  50 cc Omnipaque 300-administered into the collecting system(s) FLUOROSCOPY TIME:  Fluoroscopy Time: 0 minutes 30 seconds (4 mGy). COMPLICATIONS: None immediate. PROCEDURE: Informed written consent was obtained from the patient after a thorough discussion of the procedural risks, benefits and alternatives. All questions were addressed. Maximal Sterile Barrier Technique was utilized including caps, mask, sterile gowns, sterile  gloves, sterile drape, hand hygiene and skin antiseptic. A timeout was performed prior to the initiation of the procedure. Previous imaging reviewed. The existing occluded left nephrostomy catheter was cut and eventually removed over an Amplatz guidewire. Catheter exchanged for a new 10 French catheter. Retention loop formed the renal pelvis. Contrast injection confirms position. Thick exudative bloody fluid aspirated compatible with hydronephrosis. Catheter secured with a Prolene suture and connected to external gravity drainage bag. Sterile dressing applied. No immediate complication. Patient tolerated the procedure well. IMPRESSION: Successful fluoroscopic left nephrostomy exchange Electronically Signed   By: Jerilynn Mages.  Shick M.D.   On: 03/21/2019 11:27     Sherene Sires, DO 03/22/2019, 8:11 AM PGY-3, [place] Intern pager: [phone], text pages welcome

## 2019-03-22 NOTE — Plan of Care (Signed)
  Problem: Pain Managment: Goal: General experience of comfort will improve Outcome: Progressing   

## 2019-03-23 LAB — BASIC METABOLIC PANEL
Anion gap: 14 (ref 5–15)
BUN: 46 mg/dL — ABNORMAL HIGH (ref 6–20)
CO2: 29 mmol/L (ref 22–32)
Calcium: 9.3 mg/dL (ref 8.9–10.3)
Chloride: 94 mmol/L — ABNORMAL LOW (ref 98–111)
Creatinine, Ser: 7.41 mg/dL — ABNORMAL HIGH (ref 0.61–1.24)
GFR calc Af Amer: 9 mL/min — ABNORMAL LOW (ref >60)
GFR calc non Af Amer: 8 mL/min — ABNORMAL LOW (ref >60)
Glucose, Bld: 130 mg/dL — ABNORMAL HIGH (ref 70–99)
Potassium: 4 mmol/L (ref 3.5–5.1)
Sodium: 137 mmol/L (ref 135–145)

## 2019-03-23 MED ORDER — CHLORHEXIDINE GLUCONATE CLOTH 2 % EX PADS
6.0000 | MEDICATED_PAD | Freq: Every day | CUTANEOUS | Status: DC
  Administered 2019-03-24: 6 via TOPICAL

## 2019-03-23 NOTE — Progress Notes (Signed)
Family Medicine Teaching Service Daily Progress Note Intern Pager: [phone]  Patient name: [name] Medical record number: [id number] Date of birth: May 14, 1965 Age: 54 y.o. Gender: male  Primary Care Provider: [name], MD Consultants: Urology, interventional radiology, nephrology Code Status: Full code  Pt Overview and Major Events to Date:  03/20/2019-patient admitted for nephrostomy dysfunction 3/19-patient had new nephrostomy tube placed per IR.  HD access thrombosed, TPA given (expect re-attempt 3/20)  Assessment and Plan: [name] is a 54 y.o. male presenting with pain at nephrostomy insertion site without drainage. PMH is significant for ESRD, renal colic and spina bifida.  Nephrostomy dysfunction, renal colic Nephrostomy tube was placed in 11/22/2018.  Urology was consulted yesterday and recommended IR change the nephrostomy tube and send urine for culture.  Urology has scheduled anterograde ureteroscopy with nephrolithotomy on 3/24 with Dr. Tresa Moore.  This procedure requires a functioning nephrostomy tube.  Nephrostomy tube was replaced on 3/19.  Pain control with fentanyl patch as well as as needed oxycodone and Percocet. -Vitals per floor routine -Up with assistance -Urology provided recommendations and have signed off, appreciate recommendations -Morning CBCs -Renal function panel -Follow-up on urine cultures -Monitor for signs of infection -Strict I's and O's -Pain control with 50 mcg fentanyl patch  ESRD - HD acess successful on 3/20.  Vascular has said no other access options beyond lumbar so IR will be needed if this fails Patient is a Monday Wednesday Friday dialysis patient at [facility]. -Patient remaining inpatient until Monday for dialysis.  Catheter recommendations per nephrology  Hypothyroidism-stable TSH on admission was 3.542.  Patient takes levothyroxine at home. -Continue levothyroxine  Normocytic anemia - stable Patient  is on Plavix at home for unknown cause. -Continue home Plavix -Morning CBCs -Type and screen performed  GERD- stable Patient takes Pepcid at home -Continue home medication  Insomnia- stable Patient takes Ativan as needed, trazodone, Ambien at home for issues with sleep -Holding Ativan at this time -Continue trazodone -Continue decreased dose of Ambien 5 mg  FEN/GI: renal diet PPx: expect to go with heparin after procedures completed  Disposition: pending HD re-attempt today, (then home vs potential lumbar access)  Subjective:  Patient is lying comfortably in bed when in the room.  Reports that he has pain in that he has not received his as needed medications since around midnight.  Pain is located around the nephrostomy tube insertion  He also said his oral contrast dye intolerance in chart triggered a ban on peach cobbler and would like that resolved (I called dietary and added a note in his allergy list)   Objective: Temp:  [97.9 F (36.6 C)-99.4 F (37.4 C)] 98.2 F (36.8 C) (03/21 0447) Pulse Rate:  [55-72] 58 (03/21 0447) Resp:  [16-19] 17 (03/21 0447) BP: (102-168)/(55-88) 104/60 (03/21 0447) SpO2:  [92 %-100 %] 99 % (03/21 0447) Weight:  [117.3 kg-123.8 kg] 117.3 kg (03/20 2135) Physical Exam:  General: Lying in bed, no acute distress Cardiovascular: RRR, holosystolic murmur Respiratory: Clear to auscultation bilaterally, no wheezes noted Abdomen: Urostomy tube in place, covered in bandage at this time.  Slight serosanguinous/bloody fluid in bag Extremities: No edema noted  Laboratory: Recent Labs  Lab 03/20/19 1400 03/21/19 0543 03/22/19 0437  WBC 5.4 5.0 4.7  HGB 11.3* 10.3* 10.1*  HCT 37.6* 31.8* 32.3*  PLT 150 159 154   Recent Labs  Lab 03/20/19 1400 03/20/19 1400 03/21/19 0543 03/22/19 0437 03/23/19 0253  NA 138   < > 137 137 137  K 4.0   < > 4.7 3.9 4.0  CL 95*   < > 96* 94* 94*  CO2 26   < > 25 27 29   BUN 36*   < > 43* 57* 46*  CREATININE  6.84*   < > 7.74* 9.81* 7.41*  CALCIUM 9.3   < > 9.2 9.3 9.3  PROT 8.6*  --   --   --   --   BILITOT 0.6  --   --   --   --   ALKPHOS 79  --   --   --   --   ALT 9  --   --   --   --   AST 13*  --   --   --   --   GLUCOSE 94   < > 81 85 130*   < > = values in this interval not displayed.   Imaging/Diagnostic Tests: No results found.   Gifford Shave, MD 03/23/2019, 6:54 AM PGY-1, [place] Intern pager: [phone], text pages welcome

## 2019-03-23 NOTE — Progress Notes (Signed)
[facility] Progress Note  Subjective: pt seen in room, no distress, no c/o's. K+ good today.   Vitals:   03/22/19 2132 03/22/19 2135 03/23/19 0447 03/23/19 0948  BP: 131/88  104/60 135/85  Pulse: 61  (!) 58 67  Resp: 19  17 18   Temp: 99 F (37.2 C)  98.2 F (36.8 C) 98.6 F (37 C)  TempSrc: Oral  Oral Oral  SpO2:   99% 98%  Weight:  117.3 kg    Height:        Exam: Gen alert, no distress No jvd or bruits Chest clear bilat to bases RRR no MRG Abd soft ntnd no mass or ascites +bs, L flank nephro tube draining thick bloody fluid in small amts; RLQ urostomy Ext no LE edema Neuro is alert, Ox 3 , nf R groin TDC    Home meds:  - phoslo ac tid/ kayexalate 15 gm on Sat and Sun  - plavix 75 qd  - pepcid 20 qd  - duragesic 50 ug per hr/ ativan 1mg  prn/ oxy- aceta prn qid  - trazodone 100 hs  - lasix 80 bid prn  - synthroid 125 ug qd  - prn's/ vitamins/ supplements    Outpt HD: MWF SW  4h 56min  350/800 119.5kg   2/2.25  Hep 3700  TDC R fem  - no esa , last mircera 60 ug on 03/03/19  - senispar 30  - hect 4 ug   Assessment/ Plan: 1. SP nephrostomy exchange - done by IR on admit here, in anticipation of planned anterograde ureteroscopy with nephrolithotomy on 3/24 with Dr. Tresa Moore 2. HD access - thigh cath [facility] not functioning well here on Friday. Consulted VVS, they have no further options, suggesting translumbar cath per IR if needed. Have consulted IR. [place] worked reasonably well yesterday. Plan HD here tomorrow. TPA being instilled after all HD sessions. If doing well post HD tomorrow can be discharged.  3. HTN/volume - off meds , close to dry wt 4. SP urostomy 5. Spina bifida 6. Anemia ckd - no esa at center, Hb okay   Rob Katalin Colledge 03/23/2019, 2:37 PM   Recent Labs  Lab 03/21/19 0543 03/21/19 0543 03/22/19 0437 03/23/19 0253  K 4.7   < > 3.9 4.0  BUN 43*   < > 57* 46*  CREATININE 7.74*   < > 9.81* 7.41*  CALCIUM 9.2   < > 9.3 9.3  PHOS  5.0*  --   --   --   HGB 10.3*  --  10.1*  --    < > = values in this interval not displayed.   Inpatient medications: . calcium acetate  2,001 mg Oral With snacks  . calcium acetate  2,668 mg Oral TID WC  . Chlorhexidine Gluconate Cloth  6 each Topical Daily  . Chlorhexidine Gluconate Cloth  6 each Topical Q0600  . famotidine  20 mg Oral Daily  . fentaNYL  1 patch Transdermal Q3 days  . fluticasone  1 spray Each Nare Daily  . levothyroxine  125 mcg Oral QAC breakfast  . sodium polystyrene  15 g Oral Once per day on Sun Sat  . traZODone  100 mg Oral QHS    lidocaine, oxyCODONE-acetaminophen **AND** oxyCODONE, promethazine, zolpidem

## 2019-03-23 NOTE — Plan of Care (Signed)
  Problem: Health Behavior/Discharge Planning: Goal: Ability to manage health-related needs will improve Outcome: Progressing   

## 2019-03-24 ENCOUNTER — Inpatient Hospital Stay (HOSPITAL_COMMUNITY): Admission: RE | Admit: 2019-03-24 | Payer: Medicare Other | Source: Ambulatory Visit

## 2019-03-24 LAB — BASIC METABOLIC PANEL
Anion gap: 13 (ref 5–15)
BUN: 71 mg/dL — ABNORMAL HIGH (ref 6–20)
CO2: 29 mmol/L (ref 22–32)
Calcium: 9.5 mg/dL (ref 8.9–10.3)
Chloride: 95 mmol/L — ABNORMAL LOW (ref 98–111)
Creatinine, Ser: 9.45 mg/dL — ABNORMAL HIGH (ref 0.61–1.24)
GFR calc Af Amer: 7 mL/min — ABNORMAL LOW (ref >60)
GFR calc non Af Amer: 6 mL/min — ABNORMAL LOW (ref >60)
Glucose, Bld: 104 mg/dL — ABNORMAL HIGH (ref 70–99)
Potassium: 4.5 mmol/L (ref 3.5–5.1)
Sodium: 137 mmol/L (ref 135–145)

## 2019-03-24 LAB — SARS CORONAVIRUS 2 (TAT 6-24 HRS): SARS Coronavirus 2: NEGATIVE

## 2019-03-24 MED ORDER — HEPARIN SODIUM (PORCINE) 1000 UNIT/ML DIALYSIS
1750.0000 [IU] | INTRAMUSCULAR | Status: DC | PRN
  Administered 2019-03-24: 1800 [IU] via INTRAVENOUS_CENTRAL

## 2019-03-24 MED ORDER — FAMOTIDINE 20 MG PO TABS: 20.0000 mg | ORAL_TABLET | Freq: Every day | ORAL | 0 refills | Status: DC

## 2019-03-24 MED ORDER — ALTEPLASE 2 MG IJ SOLR
INTRAMUSCULAR | Status: AC
  Administered 2019-03-24: 4 mg
  Filled 2019-03-24: qty 4

## 2019-03-24 MED ORDER — HEPARIN SODIUM (PORCINE) 1000 UNIT/ML IJ SOLN
INTRAMUSCULAR | Status: AC
  Filled 2019-03-24: qty 2

## 2019-03-24 MED ORDER — PROMETHAZINE HCL 25 MG/ML IJ SOLN
INTRAMUSCULAR | Status: AC
  Administered 2019-03-24: 12.5 mg
  Filled 2019-03-24: qty 1

## 2019-03-24 MED ORDER — DARBEPOETIN ALFA 60 MCG/0.3ML IJ SOSY
60.0000 ug | PREFILLED_SYRINGE | INTRAMUSCULAR | Status: DC
  Administered 2019-03-24: 60 ug via INTRAVENOUS

## 2019-03-24 MED ORDER — DARBEPOETIN ALFA 60 MCG/0.3ML IJ SOSY
PREFILLED_SYRINGE | INTRAMUSCULAR | Status: AC
  Filled 2019-03-24: qty 0.3

## 2019-03-24 NOTE — OR Nursing (Signed)
[facility] transportation has been arranged for tomorrow with pick up between 10:06am-10:36am and pick up from clinic to home from 5:00pm-5:30pm. [facility] transportation has also been arranged to drop patient off at [facility] for surgery on Wednesday with a pick up between 9:00am-9:30am.  Patient very appreciative.  Alphonzo Cruise, [place] Renal Navigator [phone]

## 2019-03-24 NOTE — Progress Notes (Signed)
[facility] Progress Note   Dialysis Orders: MWF SW 4h 72min 350/800 119.5kg  2/2.25 Hep 3700 TDC R fem - no esa , last mircera 60 ug on 03/03/19 - senispar 30  - hect 4 ug Home meds: - phoslo ac tid/ kayexalate 15 gm on Sat and Sun - plavix 75 qd - pepcid 20 qd - duragesic 50 ug per hr/ ativan 1mg  prn/ oxy- aceta prn qid - trazodone 100 hs - lasix 80 bid prn - synthroid 125 ug qd - prn's/ vitamins/ supplements  Assessment/Plan: 1. S/p nephrostomy exchange- for nephrolithotomy 3/24 2. ESRD -MWF - TDC fem working better today at Qb 305 - better but not great;  TL cath is only other option K 4.5 on 2 K bath 3. Anemia - hgb 10.1 - resume Aranesp 60 - last dose Mircera 3/1 4. Secondary hyperparathyroidism - Ca 9.5  2Ca bath   5. HTN/volume - off BP meds goal 1.5  6. Disp - for d/c today  Myriam Jacobson, PA-C [place] [phone] 03/24/2019,8:41 AM  LOS: 4 days   Subjective:   No c/o - for outpatient procedure Wed so HD will be Tuesday in advance of procedure  Objective Vitals:   03/24/19 0613 03/24/19 0755 03/24/19 0800 03/24/19 0822  BP: 113/64 123/71 125/65 119/66  Pulse: 60 63 65 66  Resp: 20 13 15 14   Temp: 98.1 F (36.7 C) 98.2 F (36.8 C)    TempSrc: Oral Oral    SpO2: 97% 97%    Weight:  125.4 kg    Height:       Physical Exam General: NAD Heart: RRR Lungs: no rales Abdomen: soft NT Extremities: no LE edema Dialysis Access:  Right fem cath Qb 305    Additional Objective Labs: Basic Metabolic Panel: Recent Labs  Lab 03/21/19 0543 03/21/19 0543 03/22/19 0437 03/23/19 0253 03/24/19 0303  NA 137   < > 137 137 137  K 4.7   < > 3.9 4.0 4.5  CL 96*   < > 94* 94* 95*  CO2 25   < > 27 29 29   GLUCOSE 81   < > 85 130* 104*  BUN 43*   < > 57* 46* 71*  CREATININE 7.74*   < > 9.81* 7.41* 9.45*  CALCIUM 9.2   < > 9.3 9.3 9.5  PHOS 5.0*  --   --   --   --    < > = values in this interval not displayed.    Liver Function Tests: Recent Labs  Lab 03/20/19 1400 03/21/19 0543  AST 13*  --   ALT 9  --   ALKPHOS 79  --   BILITOT 0.6  --   PROT 8.6*  --   ALBUMIN 3.4* 3.0*   No results for input(s): LIPASE, AMYLASE in the last 168 hours. CBC: Recent Labs  Lab 03/20/19 1400 03/21/19 0543 03/22/19 0437  WBC 5.4 5.0 4.7  NEUTROABS 4.4  --   --   HGB 11.3* 10.3* 10.1*  HCT 37.6* 31.8* 32.3*  MCV 97.9 92.4 94.2  PLT 150 159 154   Blood Culture    Component Value Date/Time   SDES ABSCESS LEFT NEPHROSTOMY PLACEMENT 11/28/2018 1040   SPECREQUEST Normal 11/28/2018 1040   CULT  11/28/2018 1040    FEW FUSOBACTERIUM NUCLEATUM BETA LACTAMASE NEGATIVE Performed at [facility] [address], [place], [place] [address]    REPTSTATUS 12/03/2018 FINAL 11/28/2018 1040  Cardiac Enzymes: No results for input(s): CKTOTAL, CKMB, CKMBINDEX, TROPONINI in the last 168 hours. CBG: No results for input(s): GLUCAP in the last 168 hours. Iron Studies: No results for input(s): IRON, TIBC, TRANSFERRIN, FERRITIN in the last 72 hours. Lab Results  Component Value Date   INR 1.0 12/01/2018   INR 1.2 11/26/2018   INR 1.14 12/13/2017   Studies/Results: No results found. Medications:  . alteplase      . heparin      . calcium acetate  2,001 mg Oral With snacks  . calcium acetate  2,668 mg Oral TID WC  . Chlorhexidine Gluconate Cloth  6 each Topical Daily  . Chlorhexidine Gluconate Cloth  6 each Topical Q0600  . Chlorhexidine Gluconate Cloth  6 each Topical Q0600  . famotidine  20 mg Oral Daily  . fentaNYL  1 patch Transdermal Q3 days  . fluticasone  1 spray Each Nare Daily  . levothyroxine  125 mcg Oral QAC breakfast  . sodium polystyrene  15 g Oral Once per day on Sun Sat  . traZODone  100 mg Oral QHS

## 2019-03-24 NOTE — Discharge Summary (Signed)
[facility] Discharge Summary  Patient name: [name] Medical record number: [id number] Date of birth: [birth year] Age: 54 y.o. Gender: male Date of Admission: 03/20/2019  Date of Discharge: 03/24/2019 Admitting Physician: Lattie Haw, MD  Primary Care Provider: [name], MD Consultants: Nephrology, IR   Indication for Hospitalization: Nephrostomy tube dysfunction  Discharge Diagnoses/Problem List:  Nephrostomy dysfunction ESRD hypothyroidism normocytic anemia GERD insomnia  Disposition: Home  Discharge Condition: Stable, improved  Discharge Exam:  Temp:  [97.9 F (36.6 C)-99.4 F (37.4 C)] 98.2 F (36.8 C) (03/21 0447) Pulse Rate:  [55-72] 58 (03/21 0447) Resp:  [16-19] 17 (03/21 0447) BP: (102-168)/(55-88) 104/60 (03/21 0447) SpO2:  [92 %-100 %] 99 % (03/21 0447) Weight:  [117.3 kg-123.8 kg] 117.3 kg (03/20 2135) Physical Exam:  General: Lying in bed, no acute distress Cardiovascular: RRR, holosystolic murmur Respiratory: Clear to auscultation bilaterally, no wheezes noted Abdomen: Urostomy tube in place, covered in bandage at this time.  Slight serosanguinous/bloody fluid in bag Extremities: No edema noted   Brief Hospital Course:  Nephrostomy tube dysfunction Patient presented to the emergency department on 3/19 complaining of severe left-sided pain and reporting that his nephrostomy tube will no longer flush. Patient was seen by urology who attempted to flush the nephrostomy tube.  They recommended replacement by IR.  On 3/19 patient had nephrostomy tube replaced.  Patient is scheduled for urologic procedure on 3/24 which required functioning nephrostomy tube.  Patient was also required to get Covid test on 3/22 so that he is cleared for surgery for his procedure on 3/24.  This Covid test was collected prior to discharge.  ESRD on dialysis Monday Wednesday Friday Patient presented with issues with nephrostomy tube.   Required dialysis while inpatient.  Initially patient's dialysis cath would not allow for proper flow.  Vascular surgery was consulted and recommended translumbar catheter via IR if dialysis catheter would not function.  Nephrology attempted to reopen catheter he received with TPA and flow improved.  Patient was dialyzed on 03/22/2019.  Patient's dialysis catheter was flushed again with TPA on 3/22 and he received dialysis again.  Prior to discharge patient's dialysis schedule was updated so that he would have dialysis on 3/23 because he has a urologic procedure scheduled for 3/24.  Issues for Follow Up:  1. Hemodialysis occlusions. Vascular surgery as well as IR will consult this hospitalization. If his access clots off he'll need a translumbar catheter.  Significant Procedures:  3/19-patient had nephrostomy tube replaced 3/19-patient had hemodialysis but was unsuccessful due to poor flow 3/20-dialysis reattempted with cath being flushed with Cathflo and was successful 3/22-patient received dialysis  Significant Labs and Imaging:  Recent Labs  Lab 03/20/19 1400 03/21/19 0543 03/22/19 0437  WBC 5.4 5.0 4.7  HGB 11.3* 10.3* 10.1*  HCT 37.6* 31.8* 32.3*  PLT 150 159 154   Recent Labs  Lab 03/20/19 1400 03/20/19 1400 03/21/19 0543 03/21/19 0543 03/22/19 0437 03/22/19 0437 03/23/19 0253 03/24/19 0303  NA 138  --  137  --  137  --  137 137  K 4.0   < > 4.7   < > 3.9   < > 4.0 4.5  CL 95*  --  96*  --  94*  --  94* 95*  CO2 26  --  25  --  27  --  29 29  GLUCOSE 94  --  81  --  85  --  130* 104*  BUN 36*  --  43*  --  57*  --  46* 71*  CREATININE 6.84*  --  7.74*  --  9.81*  --  7.41* 9.45*  CALCIUM 9.3  --  9.2  --  9.3  --  9.3 9.5  PHOS  --   --  5.0*  --   --   --   --   --   ALKPHOS 79  --   --   --   --   --   --   --   AST 13*  --   --   --   --   --   --   --   ALT 9  --   --   --   --   --   --   --   ALBUMIN 3.4*  --  3.0*  --   --   --   --   --    < > = values in  this interval not displayed.    No results found.   Results/Tests Pending at Time of Discharge: None   Discharge Medications:  Allergies as of 03/24/2019      Reactions   Hydrocodone Other (See Comments)   Caused involuntary movement and twitching. CANNOT TAKE DUE TO MUSCLE SPASMS AND MUSCLE TREMORS   Furadantin [nitrofurantoin] Other (See Comments)   UNSPECIFIED REACTION    Mandelamine [methenamine] Other (See Comments)   UNSPECIFIED REACTION    Noroxin [norfloxacin] Other (See Comments)   UNSPECIFIED REACTION    Sulfa Antibiotics Other (See Comments), Cough   Childhood reaction - pt could not confirm that it was a cough   Sulfur Cough   Childhood reaction - pt could not confirm that it was a cough      Medication List    TAKE these medications   calcium acetate 667 MG capsule Commonly known as: PHOSLO Take 3-4 capsules (2,001-2,668 mg total) by mouth See admin instructions. Take 2708 mg by mouth 3 times daily with meals, take 2001 mg by mouth with snacks What changed: additional instructions   clopidogrel 75 MG tablet Commonly known as: PLAVIX Take 1 tablet (75 mg total) by mouth daily. What changed: when to take this   famotidine 20 MG tablet Commonly known as: PEPCID Take 1 tablet (20 mg total) by mouth daily. Start taking on: March 25, 2019 What changed:   medication strength  how much to take  when to take this   fentaNYL 50 MCG/HR Commonly known as: [place] 1 patch onto the skin every 3 (three) days.   fluticasone 50 MCG/ACT nasal spray Commonly known as: FLONASE Place 2 sprays into both nostrils as needed for allergies or rhinitis (congestion). What changed: reasons to take this   furosemide 80 MG tablet Commonly known as: LASIX Take 80 mg by mouth 2 (two) times daily as needed (fluid).   levothyroxine 125 MCG tablet Commonly known as: SYNTHROID Take 1 tablet (125 mcg total) by mouth daily before breakfast.   lidocaine 5 % Commonly  known as: LIDODERM Place 2 patches onto the skin daily.   LORazepam 1 MG tablet Commonly known as: ATIVAN Take 1 mg by mouth daily as needed (anxiety).   NOVASOURCE RENAL PO Take 237 mLs by mouth daily.   oxyCODONE-acetaminophen 10-325 MG tablet Commonly known as: PERCOCET Take 1 tablet by mouth every 6 (six) hours as needed for pain.   promethazine 25 MG tablet Commonly known as: PHENERGAN Take 1 tablet (25 mg total) by mouth 2 (two)  times daily as needed for nausea or vomiting. What changed: when to take this   sodium polystyrene powder Commonly known as: KAYEXALATE Take 15 g by mouth See admin instructions. Take 15 g (mixed in water) by mouth on Saturdays and Sundays   traZODone 100 MG tablet Commonly known as: DESYREL Take 1 tablet (100 mg total) by mouth at bedtime.   zolpidem 10 MG tablet Commonly known as: AMBIEN Take 1 tablet (10 mg total) by mouth at bedtime.       Discharge Instructions: Please refer to Patient Instructions section of EMR for full details.  Patient was counseled important signs and symptoms that should prompt return to medical care, changes in medications, dietary instructions, activity restrictions, and follow up appointments.   Follow-Up Appointments: Follow-up Information    [place]. Call in 1 day.   Why: To schedule an appointment Contact information: [address] [place] [address] [phone]       Haddix, Thomasene Lot, MD.   Specialty: Orthopedic Surgery Contact information: [place] [address] [phone]           Gifford Shave, MD 03/24/2019, 1:43 PM PGY-1, [place]

## 2019-03-24 NOTE — Progress Notes (Signed)
DISCHARGE NOTE HOME [name] to be discharged Home per MD order. Discussed prescriptions and follow up appointments with the patient. Prescriptions given to patient; medication list explained in detail. Patient verbalized understanding.  Skin clean, dry and intact without evidence of skin break down, no evidence of skin tears noted. IV catheter discontinued intact. Site without signs and symptoms of complications. Dressing and pressure applied. Pt denies pain at the site currently. No complaints noted.  Patient free of lines, drains, and wounds.   An After Visit Summary (AVS) was printed and given to the patient. Patient escorted via wheelchair, and discharged home via private auto.  Orville Govern, RN

## 2019-03-24 NOTE — Discharge Instructions (Signed)
Thank you for allowing [place] to participate in your care!    You were admitted for issues with your nephrostomy tube.  Your nephrostomy tube was replaced.  We also had issues regarding your dialysis catheter.  It was flushed with TPA and began to work a little bit better.  You were seen and evaluated by vascular surgery as well as interventional radiology regarding placement of translumbar catheter.  That was not needed because your current catheter began to function again.  You were dialyzed on 3/22.  A dialysis session was scheduled for you on 3/23 because you have a urology procedure on 3/24.  We went ahead and performed your preadmission Covid test for your urologic procedure.  If you experience worsening of your admission symptoms, develop shortness of breath, life threatening emergency, suicidal or homicidal thoughts you must seek medical attention immediately by calling 911 or calling your MD immediately  if symptoms less severe.   Use ear drops as described.   Take Robaxin as prescribed. This medication will make you drowsy so do not drive or drink alcohol when taking it.  Follow-up with [facility] to establish a primary care doctor if you do not have one.   Follow up with Dr. Doreatha Martin.   Return to the Emergency Dept for any worsening pain, fever, numbness/weakness that is new or any other worsening or concerning symptoms.

## 2019-03-25 MED ORDER — GENTAMICIN SULFATE 40 MG/ML IJ SOLN
5.0000 mg/kg | INTRAVENOUS | Status: AC
  Administered 2019-03-26: 470 mg via INTRAVENOUS
  Filled 2019-03-25: qty 11.75

## 2019-03-26 ENCOUNTER — Inpatient Hospital Stay (HOSPITAL_COMMUNITY): Payer: Medicare Other

## 2019-03-26 ENCOUNTER — Other Ambulatory Visit: Payer: Self-pay

## 2019-03-26 ENCOUNTER — Inpatient Hospital Stay (HOSPITAL_COMMUNITY): Payer: Medicare Other | Admitting: Physician Assistant

## 2019-03-26 ENCOUNTER — Encounter (HOSPITAL_COMMUNITY)
Admission: RE | Disposition: A | Discharge status: Home / Self Care | Payer: Self-pay | Source: Home / Self Care | Attending: Urology

## 2019-03-26 ENCOUNTER — Encounter (HOSPITAL_COMMUNITY): Payer: Self-pay | Admitting: Urology

## 2019-03-26 ENCOUNTER — Observation Stay (HOSPITAL_COMMUNITY)
Admission: RE | Admit: 2019-03-26 | Discharge: 2019-03-27 | Disposition: A | Discharge status: Home / Self Care | Payer: Medicare Other | Attending: Urology | Admitting: Urology

## 2019-03-26 DIAGNOSIS — Z8679 Personal history of other diseases of the circulatory system: Secondary | ICD-10-CM | POA: Insufficient documentation

## 2019-03-26 DIAGNOSIS — Z87442 Personal history of urinary calculi: Secondary | ICD-10-CM | POA: Insufficient documentation

## 2019-03-26 DIAGNOSIS — D631 Anemia in chronic kidney disease: Secondary | ICD-10-CM | POA: Diagnosis not present

## 2019-03-26 DIAGNOSIS — Z932 Ileostomy status: Secondary | ICD-10-CM | POA: Insufficient documentation

## 2019-03-26 DIAGNOSIS — Q059 Spina bifida, unspecified: Secondary | ICD-10-CM | POA: Diagnosis not present

## 2019-03-26 DIAGNOSIS — I739 Peripheral vascular disease, unspecified: Secondary | ICD-10-CM | POA: Insufficient documentation

## 2019-03-26 DIAGNOSIS — Z992 Dependence on renal dialysis: Secondary | ICD-10-CM | POA: Insufficient documentation

## 2019-03-26 DIAGNOSIS — K435 Parastomal hernia without obstruction or  gangrene: Secondary | ICD-10-CM | POA: Insufficient documentation

## 2019-03-26 DIAGNOSIS — Z89412 Acquired absence of left great toe: Secondary | ICD-10-CM | POA: Diagnosis not present

## 2019-03-26 DIAGNOSIS — Z936 Other artificial openings of urinary tract status: Secondary | ICD-10-CM | POA: Diagnosis not present

## 2019-03-26 DIAGNOSIS — N186 End stage renal disease: Secondary | ICD-10-CM | POA: Insufficient documentation

## 2019-03-26 DIAGNOSIS — N132 Hydronephrosis with renal and ureteral calculous obstruction: Principal | ICD-10-CM | POA: Diagnosis present

## 2019-03-26 HISTORY — PX: NEPHROLITHOTOMY: SHX5134

## 2019-03-26 LAB — HEMOGLOBIN AND HEMATOCRIT, BLOOD
HCT: 39.7 % (ref 39.0–52.0)
Hemoglobin: 11.9 g/dL — ABNORMAL LOW (ref 13.0–17.0)

## 2019-03-26 LAB — BASIC METABOLIC PANEL
Anion gap: 10 (ref 5–15)
BUN: 39 mg/dL — ABNORMAL HIGH (ref 6–20)
CO2: 29 mmol/L (ref 22–32)
Calcium: 9.5 mg/dL (ref 8.9–10.3)
Chloride: 98 mmol/L (ref 98–111)
Creatinine, Ser: 6 mg/dL — ABNORMAL HIGH (ref 0.61–1.24)
GFR calc Af Amer: 11 mL/min — ABNORMAL LOW (ref >60)
GFR calc non Af Amer: 10 mL/min — ABNORMAL LOW (ref >60)
Glucose, Bld: 97 mg/dL (ref 70–99)
Potassium: 4 mmol/L (ref 3.5–5.1)
Sodium: 137 mmol/L (ref 135–145)

## 2019-03-26 LAB — CBC
HCT: 35.7 % — ABNORMAL LOW (ref 39.0–52.0)
Hemoglobin: 11 g/dL — ABNORMAL LOW (ref 13.0–17.0)
MCH: 29.4 pg (ref 26.0–34.0)
MCHC: 30.8 g/dL (ref 30.0–36.0)
MCV: 95.5 fL (ref 80.0–100.0)
Platelets: 150 10*3/uL (ref 150–400)
RBC: 3.74 MIL/uL — ABNORMAL LOW (ref 4.22–5.81)
RDW: 14.7 % (ref 11.5–15.5)
WBC: 5.9 10*3/uL (ref 4.0–10.5)
nRBC: 0 % (ref 0.0–0.2)

## 2019-03-26 LAB — SURGICAL PCR SCREEN
MRSA, PCR: NEGATIVE
Staphylococcus aureus: NEGATIVE

## 2019-03-26 SURGERY — NEPHROLITHOTOMY PERCUTANEOUS
Anesthesia: General | Laterality: Left

## 2019-03-26 MED ORDER — PHENYLEPHRINE HCL-NACL 10-0.9 MG/250ML-% IV SOLN
INTRAVENOUS | Status: DC | PRN
  Administered 2019-03-26: 25 ug/min via INTRAVENOUS

## 2019-03-26 MED ORDER — FENTANYL CITRATE (PF) 100 MCG/2ML IJ SOLN
INTRAMUSCULAR | Status: AC
  Filled 2019-03-26: qty 2

## 2019-03-26 MED ORDER — CALCIUM ACETATE (PHOS BINDER) 667 MG PO CAPS
2001.0000 mg | ORAL_CAPSULE | ORAL | Status: DC | PRN
  Filled 2019-03-26: qty 3

## 2019-03-26 MED ORDER — TRAZODONE HCL 100 MG PO TABS
100.0000 mg | ORAL_TABLET | Freq: Every day | ORAL | Status: DC
  Administered 2019-03-26: 100 mg via ORAL
  Filled 2019-03-26: qty 1

## 2019-03-26 MED ORDER — PROMETHAZINE HCL 25 MG/ML IJ SOLN
6.2500 mg | INTRAMUSCULAR | Status: DC | PRN
  Administered 2019-03-26: 6.25 mg via INTRAVENOUS

## 2019-03-26 MED ORDER — HYDROMORPHONE HCL 1 MG/ML IJ SOLN
INTRAMUSCULAR | Status: AC
  Filled 2019-03-26: qty 1

## 2019-03-26 MED ORDER — DEXAMETHASONE SODIUM PHOSPHATE 10 MG/ML IJ SOLN
INTRAMUSCULAR | Status: AC
  Filled 2019-03-26: qty 1

## 2019-03-26 MED ORDER — IOHEXOL 300 MG/ML  SOLN
INTRAMUSCULAR | Status: DC | PRN
  Administered 2019-03-26: 40 mL

## 2019-03-26 MED ORDER — ONDANSETRON HCL 4 MG/2ML IJ SOLN
4.0000 mg | INTRAMUSCULAR | Status: DC | PRN
  Administered 2019-03-26 – 2019-03-27 (×2): 4 mg via INTRAVENOUS
  Filled 2019-03-26 (×2): qty 2

## 2019-03-26 MED ORDER — SODIUM CHLORIDE 0.9 % IV SOLN: INTRAVENOUS | Status: DC

## 2019-03-26 MED ORDER — OXYCODONE HCL 5 MG PO TABS
5.0000 mg | ORAL_TABLET | Freq: Four times a day (QID) | ORAL | Status: DC | PRN
  Administered 2019-03-26 – 2019-03-27 (×3): 5 mg via ORAL
  Filled 2019-03-26 (×3): qty 1

## 2019-03-26 MED ORDER — ONDANSETRON HCL 4 MG/2ML IJ SOLN
INTRAMUSCULAR | Status: AC
  Filled 2019-03-26: qty 2

## 2019-03-26 MED ORDER — SODIUM CHLORIDE 0.9 % IR SOLN
Status: DC | PRN
  Administered 2019-03-26: 6000 mL

## 2019-03-26 MED ORDER — ONDANSETRON HCL 4 MG/2ML IJ SOLN
INTRAMUSCULAR | Status: DC | PRN
  Administered 2019-03-26: 4 mg via INTRAVENOUS

## 2019-03-26 MED ORDER — PROPOFOL 10 MG/ML IV BOLUS
INTRAVENOUS | Status: DC | PRN
  Administered 2019-03-26: 200 mg via INTRAVENOUS

## 2019-03-26 MED ORDER — DEXAMETHASONE SODIUM PHOSPHATE 10 MG/ML IJ SOLN
INTRAMUSCULAR | Status: DC | PRN
  Administered 2019-03-26: 10 mg via INTRAVENOUS

## 2019-03-26 MED ORDER — OXYCODONE-ACETAMINOPHEN 5-325 MG PO TABS
1.0000 | ORAL_TABLET | Freq: Four times a day (QID) | ORAL | Status: DC | PRN
  Administered 2019-03-26 – 2019-03-27 (×3): 1 via ORAL
  Filled 2019-03-26 (×3): qty 1

## 2019-03-26 MED ORDER — ROCURONIUM BROMIDE 10 MG/ML (PF) SYRINGE
PREFILLED_SYRINGE | INTRAVENOUS | Status: DC | PRN
  Administered 2019-03-26 (×4): 20 mg via INTRAVENOUS
  Administered 2019-03-26: 60 mg via INTRAVENOUS

## 2019-03-26 MED ORDER — CHLORHEXIDINE GLUCONATE CLOTH 2 % EX PADS: 6.0000 | MEDICATED_PAD | Freq: Every day | CUTANEOUS | Status: DC

## 2019-03-26 MED ORDER — SUGAMMADEX SODIUM 500 MG/5ML IV SOLN
INTRAVENOUS | Status: DC | PRN
  Administered 2019-03-26: 500 mg via INTRAVENOUS

## 2019-03-26 MED ORDER — FAMOTIDINE 20 MG PO TABS
20.0000 mg | ORAL_TABLET | Freq: Every day | ORAL | Status: DC
  Administered 2019-03-27: 20 mg via ORAL
  Filled 2019-03-26 (×2): qty 1

## 2019-03-26 MED ORDER — FENTANYL CITRATE (PF) 100 MCG/2ML IJ SOLN
INTRAMUSCULAR | Status: DC | PRN
  Administered 2019-03-26 (×4): 50 ug via INTRAVENOUS

## 2019-03-26 MED ORDER — LACTATED RINGERS IV SOLN: INTRAVENOUS | Status: DC | PRN

## 2019-03-26 MED ORDER — FENTANYL CITRATE (PF) 100 MCG/2ML IJ SOLN
25.0000 ug | INTRAMUSCULAR | Status: DC | PRN
  Administered 2019-03-26 (×3): 50 ug via INTRAVENOUS

## 2019-03-26 MED ORDER — LIDOCAINE 2% (20 MG/ML) 5 ML SYRINGE
INTRAMUSCULAR | Status: AC
  Filled 2019-03-26: qty 5

## 2019-03-26 MED ORDER — ROCURONIUM BROMIDE 10 MG/ML (PF) SYRINGE
PREFILLED_SYRINGE | INTRAVENOUS | Status: AC
  Filled 2019-03-26: qty 10

## 2019-03-26 MED ORDER — PROMETHAZINE HCL 25 MG/ML IJ SOLN
INTRAMUSCULAR | Status: AC
  Filled 2019-03-26: qty 1

## 2019-03-26 MED ORDER — 0.9 % SODIUM CHLORIDE (POUR BTL) OPTIME
TOPICAL | Status: DC | PRN
  Administered 2019-03-26: 1000 mL

## 2019-03-26 MED ORDER — ZOLPIDEM TARTRATE 10 MG PO TABS
10.0000 mg | ORAL_TABLET | Freq: Every day | ORAL | Status: DC
  Administered 2019-03-26: 10 mg via ORAL
  Filled 2019-03-26: qty 1

## 2019-03-26 MED ORDER — HYDROMORPHONE HCL 1 MG/ML IJ SOLN
0.2500 mg | INTRAMUSCULAR | Status: DC | PRN
  Administered 2019-03-26 (×2): 0.5 mg via INTRAVENOUS

## 2019-03-26 MED ORDER — PROMETHAZINE HCL 25 MG PO TABS
25.0000 mg | ORAL_TABLET | Freq: Three times a day (TID) | ORAL | Status: DC | PRN
  Administered 2019-03-26 – 2019-03-27 (×2): 25 mg via ORAL
  Filled 2019-03-26 (×2): qty 1

## 2019-03-26 MED ORDER — LEVOTHYROXINE SODIUM 25 MCG PO TABS
125.0000 ug | ORAL_TABLET | Freq: Every day | ORAL | Status: DC
  Administered 2019-03-27: 125 ug via ORAL
  Filled 2019-03-26: qty 1

## 2019-03-26 MED ORDER — OXYCODONE-ACETAMINOPHEN 10-325 MG PO TABS: 1.0000 | ORAL_TABLET | Freq: Four times a day (QID) | ORAL | Status: DC | PRN

## 2019-03-26 MED ORDER — SODIUM CHLORIDE 0.9 % IV SOLN
INTRAVENOUS | Status: DC
  Administered 2019-03-26: 500 mL via INTRAVENOUS

## 2019-03-26 MED ORDER — MIDAZOLAM HCL 5 MG/5ML IJ SOLN
INTRAMUSCULAR | Status: DC | PRN
  Administered 2019-03-26: 2 mg via INTRAVENOUS

## 2019-03-26 MED ORDER — LIDOCAINE 2% (20 MG/ML) 5 ML SYRINGE
INTRAMUSCULAR | Status: DC | PRN
  Administered 2019-03-26: 100 mg via INTRAVENOUS

## 2019-03-26 MED ORDER — FENTANYL CITRATE (PF) 100 MCG/2ML IJ SOLN
25.0000 ug | INTRAMUSCULAR | Status: DC | PRN
  Administered 2019-03-26 – 2019-03-27 (×5): 50 ug via INTRAVENOUS
  Filled 2019-03-26 (×5): qty 2

## 2019-03-26 MED ORDER — CALCIUM ACETATE (PHOS BINDER) 667 MG PO CAPS
2668.0000 mg | ORAL_CAPSULE | Freq: Three times a day (TID) | ORAL | Status: DC
  Filled 2019-03-26 (×4): qty 4

## 2019-03-26 MED ORDER — SUGAMMADEX SODIUM 500 MG/5ML IV SOLN
INTRAVENOUS | Status: AC
  Filled 2019-03-26: qty 5

## 2019-03-26 MED ORDER — LORAZEPAM 1 MG PO TABS: 1.0000 mg | ORAL_TABLET | Freq: Every day | ORAL | Status: DC | PRN

## 2019-03-26 MED ORDER — MIDAZOLAM HCL 2 MG/2ML IJ SOLN
INTRAMUSCULAR | Status: AC
  Filled 2019-03-26: qty 2

## 2019-03-26 SURGICAL SUPPLY — 70 items
BAG URINE DRAIN 2000ML AR STRL (UROLOGICAL SUPPLIES) ×3 IMPLANT
BAG URO CATCHER STRL LF (MISCELLANEOUS) ×1 IMPLANT
BASKET LASER NITINOL 1.9FR (BASKET) ×2 IMPLANT
BASKET ZERO TIP NITINOL 2.4FR (BASKET) ×1 IMPLANT
BENZOIN TINCTURE PRP APPL 2/3 (GAUZE/BANDAGES/DRESSINGS) ×3 IMPLANT
BLADE SURG 15 STRL LF DISP TIS (BLADE) ×1 IMPLANT
BLADE SURG 15 STRL SS (BLADE) ×1
CATH FOLEY 2W COUNCIL 20FR 5CC (CATHETERS) IMPLANT
CATH FOLEY 2WAY SLVR  5CC 16FR (CATHETERS)
CATH FOLEY 2WAY SLVR 5CC 16FR (CATHETERS) ×1 IMPLANT
CATH IMAGER II 65CM (CATHETERS) ×2 IMPLANT
CATH INTERMIT  6FR 70CM (CATHETERS) ×1 IMPLANT
CATH MULTI PURPOSE 16FR DRAIN (CATHETERS) ×1 IMPLANT
CATH ROBINSON RED A/P 20FR (CATHETERS) IMPLANT
CATH ULTRATHANE 14FR (CATHETERS) ×1 IMPLANT
CATH X-FORCE N30 NEPHROSTOMY (TUBING) ×2 IMPLANT
CHLORAPREP W/TINT 26 (MISCELLANEOUS) ×3 IMPLANT
CLOTH BEACON ORANGE TIMEOUT ST (SAFETY) ×2 IMPLANT
COVER WAND RF STERILE (DRAPES) IMPLANT
DRAPE C-ARM 42X120 X-RAY (DRAPES) ×2 IMPLANT
DRAPE LINGEMAN PERC (DRAPES) ×2 IMPLANT
DRAPE SHEET LG 3/4 BI-LAMINATE (DRAPES) ×2 IMPLANT
DRAPE SURG IRRIG POUCH 19X23 (DRAPES) ×2 IMPLANT
DRSG PAD ABDOMINAL 8X10 ST (GAUZE/BANDAGES/DRESSINGS) ×2 IMPLANT
DRSG TEGADERM 4X4.75 (GAUZE/BANDAGES/DRESSINGS) ×1 IMPLANT
DRSG TEGADERM 6X8 (GAUZE/BANDAGES/DRESSINGS) ×1 IMPLANT
DRSG TEGADERM 8X12 (GAUZE/BANDAGES/DRESSINGS) ×2 IMPLANT
EXTRACTOR STONE 1.7FRX115CM (UROLOGICAL SUPPLIES) IMPLANT
FIBER LASER FLEXIVA 1000 (UROLOGICAL SUPPLIES) IMPLANT
FIBER LASER FLEXIVA 365 (UROLOGICAL SUPPLIES) IMPLANT
FIBER LASER FLEXIVA 550 (UROLOGICAL SUPPLIES) IMPLANT
FIBER LASER TRAC TIP (UROLOGICAL SUPPLIES) ×1 IMPLANT
GAUZE SPONGE 4X4 12PLY STRL (GAUZE/BANDAGES/DRESSINGS) ×2 IMPLANT
GLOVE BIOGEL M STRL SZ7.5 (GLOVE) ×4 IMPLANT
GOWN STRL REUS W/TWL LRG LVL3 (GOWN DISPOSABLE) ×4 IMPLANT
GUIDEWIRE AMPLAZ .035X145 (WIRE) ×4 IMPLANT
GUIDEWIRE ANG ZIPWIRE 038X150 (WIRE) ×4 IMPLANT
GUIDEWIRE STR DUAL SENSOR (WIRE) ×2 IMPLANT
IV SET EXTENSION CATH 6 NF (IV SETS) ×1 IMPLANT
KIT BASIN OR (CUSTOM PROCEDURE TRAY) ×2 IMPLANT
KIT PROBE 340X3.4XDISP GRN (MISCELLANEOUS) IMPLANT
KIT PROBE TRILOGY 3.4X340 (MISCELLANEOUS)
KIT PROBE TRILOGY 3.9X350 (MISCELLANEOUS) ×1 IMPLANT
KIT TURNOVER KIT A (KITS) IMPLANT
MANIFOLD NEPTUNE II (INSTRUMENTS) ×2 IMPLANT
NDL TROCAR 18X15 ECHO (NEEDLE) IMPLANT
NDL TROCAR 18X20 (NEEDLE) IMPLANT
NEEDLE TROCAR 18X15 ECHO (NEEDLE) IMPLANT
NEEDLE TROCAR 18X20 (NEEDLE) IMPLANT
NS IRRIG 1000ML POUR BTL (IV SOLUTION) ×2 IMPLANT
PACK CYSTO (CUSTOM PROCEDURE TRAY) ×1 IMPLANT
SHEATH PEELAWAY SET 9 (SHEATH) ×2 IMPLANT
SHEATH URETERAL 12FRX28CM (UROLOGICAL SUPPLIES) IMPLANT
SHEATH URETERAL 12FRX35CM (MISCELLANEOUS) IMPLANT
SPONGE LAP 4X18 RFD (DISPOSABLE) ×2 IMPLANT
SURGIFLO W/THROMBIN 8M KIT (HEMOSTASIS) ×1 IMPLANT
SUT SILK 2 0 30  PSL (SUTURE) ×1
SUT SILK 2 0 30 PSL (SUTURE) ×1 IMPLANT
SYR 10ML LL (SYRINGE) ×2 IMPLANT
SYR 20ML LL LF (SYRINGE) ×3 IMPLANT
SYR 50ML LL SCALE MARK (SYRINGE) ×2 IMPLANT
TOWEL OR 17X26 10 PK STRL BLUE (TOWEL DISPOSABLE) ×2 IMPLANT
TRAY FOLEY MTR SLVR 16FR STAT (SET/KITS/TRAYS/PACK) ×1 IMPLANT
TUBE CONNECTING VINYL 14FR 30C (TUBING) ×1 IMPLANT
TUBE FEEDING 8FR 16IN STR KANG (MISCELLANEOUS) ×1 IMPLANT
TUBING CONNECTING 10 (TUBING) ×5 IMPLANT
TUBING STONE CATCHER TRILOGY (MISCELLANEOUS) IMPLANT
TUBING UROLOGY SET (TUBING) ×2 IMPLANT
WATER STERILE IRR 1000ML POUR (IV SOLUTION) ×2 IMPLANT
WATER STERILE IRR 3000ML UROMA (IV SOLUTION) ×2 IMPLANT

## 2019-03-26 NOTE — Brief Op Note (Signed)
03/26/2019  2:07 PM  PATIENT:  [name]  54 y.o. male  PRE-OPERATIVE DIAGNOSIS:  LEFT RENAL AND URETERAL STONES, ILEAL CONDUIT  POST-OPERATIVE DIAGNOSIS:  LEFT RENAL AND URETERAL STONES, ILEAL CONDUIT  PROCEDURE:  Procedure(s) with comments: NEPHROLITHOTOMY PERCUTANEOUS (Left) - 2 HRS  SURGEON:  Surgeon(s) and Role:    Alexis Frock, MD - Primary  PHYSICIAN ASSISTANT:   ASSISTANTS: none   ANESTHESIA:   general  EBL:  minimal   BLOOD ADMINISTERED:none  DRAINS: RLQ Urostomy to gravity; Rt nephroureteral stent capped   LOCAL MEDICATIONS USED:  NONE  SPECIMEN:  Source of Specimen:  Rt ureteral stone fragments  DISPOSITION OF SPECIMEN:  compositional analysis  COUNTS:  YES  TOURNIQUET:  * No tourniquets in log *  DICTATION: .Other Dictation: Dictation Number [id number]  PLAN OF CARE: Admit to inpatient   PATIENT DISPOSITION:  PACU - hemodynamically stable.   Delay start of Pharmacological VTE agent (>24hrs) due to surgical blood loss or risk of bleeding: yes

## 2019-03-26 NOTE — H&P (Signed)
[name] is an 54 y.o. male.    Chief Complaint: Pre-OP LEFT Percutaneous Nephrsotolithotomy and antegrade ureteroscopy.    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 He appears to have some bladder and prostate tissue in situe by imaging. Last dialyzed 3/23 in preparation for surgery today.   2 - End STage Renal Disease / Rt Renal Atrophy - on hemodialyssis MWF via Rt groin catheter. His nephrologist is Corliss Parish MD. CT 2020 x many with nearly atrophic Rt kidney.   3 - Urolithiasis - Long h/o recurrent stones per report leading to ESRD. Left renal and multifocal ureteral stones by CT x several 2020, ureteral stones punctate (<86mm) and renal volume <1cm). Left neph tube placed 11/2018 in setting of possible infection (CX's all negative) and remains in situ.   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    PMH sig for spina bifida (mostly treated at [facility]), PAD/Iliac stent, obesity. His PCP is [facility].   Today " [name] " is seen to proceed with LEFT antegrade ureteroscopic stone manipulation / PCNL.     Past Medical History:  Diagnosis Date  . Anemia   . Anxiety   . Constipation   . End stage renal failure on dialysis [facility])    [place]; MWF, on HD since 1997, exhausted upper ext access, and possibly LE access; cath dependent in R groin as of 06/2018  . GERD (gastroesophageal reflux disease)   . Grand mal seizure ([place]) X 4   last 1998 (02/29/2016)  . Heart murmur    no problems with per pt. - nephrologist and Dr. Coletta Memos  . History of blood transfusion 2000s   "I was having blood loss; never found out from where"  . History of kidney stones   . Hypertension    hx of - has not taken bp meds in over 2 years  since dialysis  . Hypothyroidism   . Insomnia   . Migraine    "due to my BP in my late 1990s; none since" (02/29/2016)  .  Nonhealing surgical wound    of the left arteriovenous graft  . Pneumonia    x 2  . Spina bifida [facility])     Past Surgical History:  Procedure Laterality Date  . AMPUTATION Left 12/11/2017   Procedure: Left Great Toe Amputation;  Surgeon: [name], MD;  Location: [place];  Service: Orthopedics;  Laterality: Left;  . APPENDECTOMY    . ARTERIOVENOUS GRAFT PLACEMENT Left 10/16/2012   left femoral goretex graft         Dr Donnetta Hutching  . AV FISTULA PLACEMENT Left 06/27/2012   Procedure: EXPLORATORY LEFT THY-GRAFT PSEUDO-ANEURYSM;  Surgeon: Conrad Brookdale, MD;  Location: [place];  Service: Vascular;  Laterality: Left;  Revision of left Arteriovenus gortex graft in thigh.  . COLONOSCOPY    . FLEXIBLE SIGMOIDOSCOPY N/A 12/03/2018   Procedure: FLEXIBLE SIGMOIDOSCOPY;  Surgeon: Yetta Flock, MD;  Location: [place];  Service: Gastroenterology;  Laterality: N/A;  . I & D EXTREMITY Left 02/29/2016   Procedure: DEBRIDEMENT LEFT THIGH WOUND;  Surgeon: Waynetta Sandy, MD;  Location: [place];  Service: Vascular;  Laterality: Left;  . ILEOSTOMY  1970s  . INCISION AND DRAINAGE Left 10/16/2012   Procedure: INCISION AND Debridement left thigh graft;  Surgeon: Rosetta Posner, MD;  Location: [place];  Service: Vascular;  Laterality: Left;  . INSERTION OF DIALYSIS CATHETER    . INSERTION OF DIALYSIS CATHETER  01/14/2016   Procedure: INSERTION OF DIALYSIS CATHETER;  Surgeon: Waynetta Sandy, MD;  Location: [place];  Service: Vascular;;  . INSERTION OF DIALYSIS CATHETER Right 08/07/2017   Procedure: INSERTION OF DIALYSIS CATHETER;  Surgeon: Waynetta Sandy, MD;  Location: [place];  Service: Vascular;  Laterality: Right;  . INSERTION OF DIALYSIS CATHETER Right 12/21/2017   Procedure: RIGHT FEMORAL DIALYSIS CATHETER EXCHANGE;  Surgeon: Marty Heck, MD;  Location: [place];  Service: Vascular;  Laterality: Right;  . INSERTION OF ILIAC STENT Left 01/14/2016   Procedure: INSERTION OF ILIAC  STENT;  Surgeon: Waynetta Sandy, MD;  Location: [place];  Service: Vascular;  Laterality: Left;  . INTRAOPERATIVE ARTERIOGRAM Left 01/14/2016   Procedure: INTRA OPERATIVE ARTERIOGRAM;  Surgeon: Waynetta Sandy, MD;  Location: [place];  Service: Vascular;  Laterality: Left;  . IR NEPHROSTOMY EXCHANGE LEFT  03/21/2019  . IR NEPHROSTOMY PLACEMENT LEFT  11/28/2018  . LITHOTRIPSY  x 3   "& a laser treatment" (02/29/2016)  . NEPHROSTOMY Bilateral 1968  . PATELLAR TENDON REPAIR Right 1990s   "big incision"  . PERIPHERAL VASCULAR CATHETERIZATION  01/14/2016   Procedure: A/V SHUNTOGRAM;  Surgeon: Waynetta Sandy, MD;  Location: [place];  Service: Vascular;;  . REVISION OF ARTERIOVENOUS GORETEX GRAFT Left 10/16/2012   Procedure: REVISION OF LEFT FEMORAL LOOP ARTERIOVENOUS GORETEX GRAFT;  Surgeon: Rosetta Posner, MD;  Location: [place];  Service: Vascular;  Laterality: Left;  . REVISION OF ARTERIOVENOUS GORETEX GRAFT Left 02/11/2015   Procedure: EXCISION OF SMALL SEGMENT OF EXPOSED LEFT THIGH NON FUNCTIONING  ARTERIOVENOUS GORETEX GRAFT;  Surgeon: Mal Misty, MD;  Location: [place];  Service: Vascular;  Laterality: Left;  . REVISION OF ARTERIOVENOUS GORETEX GRAFT Left 01/14/2016   Procedure: REVISION OF ARTERIOVENOUS GORETEX GRAFT;  Surgeon: Waynetta Sandy, MD;  Location: [place];  Service: Vascular;  Laterality: Left;  . REVISION OF ARTERIOVENOUS GORETEX GRAFT Left 02/29/2016   thigh/pt report  . REVISION OF ARTERIOVENOUS GORETEX GRAFT Left 02/29/2016   Procedure: POSSIBLE REVISION OF LEFT THIGH ARTERIOVENOUS GORETEX GRAFT;  Surgeon: Waynetta Sandy, MD;  Location: [place];  Service: Vascular;  Laterality: Left;  . TEE WITHOUT CARDIOVERSION N/A 12/14/2017   Procedure: TRANSESOPHAGEAL ECHOCARDIOGRAM (TEE);  Surgeon: Acie Fredrickson, Wonda Cheng, MD;  Location: [place];  Service: Cardiovascular;  Laterality: N/A;  . THROMBECTOMY AND REVISION OF ARTERIOVENTOUS (AV) GORETEX  GRAFT Left  06/12/2018   Procedure: Removal of infected left thigh graft;  Surgeon: Rosetta Posner, MD;  Location: [place];  Service: Vascular;  Laterality: Left;  . THROMBECTOMY W/ EMBOLECTOMY Left 01/14/2016   Procedure: THROMBECTOMY ARTERIOVENOUS GORE-TEX Left thigh GRAFT;  Surgeon: Waynetta Sandy, MD;  Location: [place];  Service: Vascular;  Laterality: Left;  . TONGUE SURGERY  ~ 1990   tongue-tie release   . ULTRASOUND GUIDANCE FOR VASCULAR ACCESS Right 08/07/2017   Procedure: ULTRASOUND GUIDANCE FOR VASCULAR CANNULATION RIGHT FEMORAL VEIN AND LEFT AV FEMORAL GRAFT;  Surgeon: Waynetta Sandy, MD;  Location: [place];  Service: Vascular;  Laterality: Right;  . UPPER EXTREMITY VENOGRAPHY Bilateral 08/09/2017   Procedure: UPPER EXTREMITY VENOGRAPHY;  Surgeon: Serafina Mitchell, MD;  Location: [place] [facility];  Service: Cardiovascular;  Laterality: Bilateral;  . VENOGRAM N/A 09/20/2017   Procedure: RIGHT COMMON FEMORAL ARTERY EXPLORATION.  CANNULATION RIGHT COMMON FEMORAL VEIN. VENOGRAM CENTRAL ULTRA SOUND GUIDED RIGHT  FEMORAL VEIN TIMES TWO.;  Surgeon: Waynetta Sandy, MD;  Location: [facility] OR;  Service: Vascular;  Laterality: N/A;  . WOUND DEBRIDEMENT Left 02/29/2016   thigh    Family History  Problem Relation Age of Onset  . Diabetes Mother   . Hypertension Mother   . Heart disease Mother        before age 20  . Diabetes Father   . Heart attack Father        X's 3  . Diabetes Sister   . Bipolar disorder Sister    Social History:  reports that he has never smoked. He has never used smokeless tobacco. He reports previous alcohol use. He reports previous drug use. Drug: Marijuana.  Allergies:  Allergies  Allergen Reactions  . Hydrocodone Other (See Comments)    Caused involuntary movement and twitching. CANNOT TAKE DUE TO MUSCLE SPASMS AND MUSCLE TREMORS  . Furadantin [Nitrofurantoin] Other (See Comments)    UNSPECIFIED REACTION   . Mandelamine [Methenamine] Other (See  Comments)    UNSPECIFIED REACTION   . Noroxin [Norfloxacin] Other (See Comments)    UNSPECIFIED REACTION   . Sulfa Antibiotics Other (See Comments) and Cough    Childhood reaction - pt could not confirm that it was a cough  . Sulfur Cough    Childhood reaction - pt could not confirm that it was a cough    No medications prior to admission.    Results for orders placed or performed during the hospital encounter of 03/20/19 (from the past 48 hour(s))  SARS CORONAVIRUS 2 (TAT 6-24 HRS) Nasopharyngeal Nasopharyngeal Swab     Status: None   Collection Time: 03/24/19  1:15 PM   Specimen: Nasopharyngeal Swab  Result Value Ref Range   SARS Coronavirus 2 NEGATIVE NEGATIVE    Comment: (NOTE) SARS-CoV-2 target nucleic acids are NOT DETECTED. The SARS-CoV-2 RNA is generally detectable in upper and lower respiratory specimens during the acute phase of infection. Negative results do not preclude SARS-CoV-2 infection, do not rule out co-infections with other pathogens, and should not be used as the sole basis for treatment or other patient management decisions. Negative results must be combined with clinical observations, patient history, and epidemiological information. The expected result is Negative. Fact Sheet for Patients: SugarRoll.be Fact Sheet for Healthcare Providers: https://www.woods-mathews.com/ This test is not yet approved or cleared by the [place] FDA and  has been authorized for detection and/or diagnosis of SARS-CoV-2 by FDA under an Emergency Use Authorization (EUA). This EUA will remain  in effect (meaning this test can be used) for the duration of the COVID-19 declaration under Section 56 4(b)(1) of the Act, 21 U.S.C. section 360bbb-3(b)(1), unless the authorization is terminated or revoked sooner. Performed at [facility], [place] [address] [place], [place] [address]    No results found.  Review of Systems   Constitutional: Positive for fatigue. Negative for chills and fever.  All other systems reviewed and are negative.   There were no vitals taken for this visit. Physical Exam  Constitutional:  Stigmata of chronic disease.   Eyes: Pupils are equal, round, and reactive to light.  Cardiovascular: Normal rate.  Respiratory: Effort normal.  GI:  Moderate obeseity. RLQ Urostomy pink and patent.   Genitourinary:    Genitourinary Comments: Left nephrostomy with non-foul urrine.    Musculoskeletal:     Cervical back: Normal range of motion.  Neurological: He is alert.  Skin: Skin is warm.  Psychiatric: He has a normal  mood and affect.     Assessment/Plan  Proceed as planned with LEFT PCNL / Antegrade ureteroscoyp with goal of left side stoen free. Risks (of which he is at increased liklihood given comorbidity), benefits, alternatives, expected peri-op course discussed previously and reiterated today. Likely admit post-op with transfer to [facility] for IHD tomorrow and DC after that.   Alexis Frock, MD 03/26/2019, 9:39 AM

## 2019-03-26 NOTE — Care Management Obs Status (Signed)
MEDICARE OBSERVATION STATUS NOTIFICATION   Patient Details  Name: [name] MRN: [id number] Date of Birth: [birth year]   Medicare Observation Status Notification Given:  Yes    Erenest Rasher, RN 03/26/2019, 7:31 PM

## 2019-03-26 NOTE — Care Management CC44 (Signed)
Condition Code 44 Documentation Completed  Patient Details  Name: [name] MRN: [id number] Date of Birth: Jan 23, 1965   Condition Code 44 given:  Yes Patient signature on Condition Code 44 notice:  Yes Documentation of 2 MD's agreement:  Yes Code 44 added to claim:  Yes    Erenest Rasher, RN 03/26/2019, 7:31 PM

## 2019-03-26 NOTE — Anesthesia Preprocedure Evaluation (Addendum)
Anesthesia Evaluation  Patient identified by MRN, date of birth, ID band Patient awake    Reviewed: Allergy & Precautions, NPO status , Patient's Chart, lab work & pertinent test results  Airway Mallampati: II  TM Distance: >3 FB     Dental   Pulmonary pneumonia,    breath sounds clear to auscultation       Cardiovascular hypertension, + Peripheral Vascular Disease  + Valvular Problems/Murmurs  Rhythm:Regular Rate:Normal     Neuro/Psych    GI/Hepatic Neg liver ROS, GERD  ,  Endo/Other  Hypothyroidism   Renal/GU Renal disease     Musculoskeletal   Abdominal   Peds  Hematology  (+) anemia ,   Anesthesia Other Findings   Reproductive/Obstetrics                             Anesthesia Physical Anesthesia Plan  ASA: III  Anesthesia Plan: General   Post-op Pain Management:    Induction: Intravenous  PONV Risk Score and Plan: 3 and Ondansetron, Dexamethasone and Midazolam  Airway Management Planned: Oral ETT  Additional Equipment: Arterial line  Intra-op Plan:   Post-operative Plan: Extubation in OR  Informed Consent: I have reviewed the patients History and Physical, chart, labs and discussed the procedure including the risks, benefits and alternatives for the proposed anesthesia with the patient or authorized representative who has indicated his/her understanding and acceptance.     Dental advisory given  Plan Discussed with: CRNA  Anesthesia Plan Comments:       Anesthesia Quick Evaluation

## 2019-03-26 NOTE — Transfer of Care (Signed)
Immediate Anesthesia Transfer of Care Note  Patient: [name]  Procedure(s) Performed: Procedure(s) with comments: NEPHROLITHOTOMY PERCUTANEOUS (Left) - 2 HRS  Patient Location: PACU  Anesthesia Type:General  Level of Consciousness:  sedated, patient cooperative and responds to stimulation  Airway & Oxygen Therapy:Patient Spontanous Breathing and Patient connected to face mask oxgen  Post-op Assessment:  Report given to PACU RN and Post -op Vital signs reviewed and stable  Post vital signs:  Reviewed and stable  Last Vitals:  Vitals:   03/26/19 1005  BP: 137/67  Pulse: 72  Resp: 18  Temp: 36.7 C  SpO2: 63%    Complications: No apparent anesthesia complications

## 2019-03-26 NOTE — Anesthesia Procedure Notes (Signed)
Procedure Name: Intubation Date/Time: 03/26/2019 12:25 PM Performed by: Lavina Hamman, CRNA Pre-anesthesia Checklist: Patient identified, Emergency Drugs available, Suction available and Patient being monitored Patient Re-evaluated:Patient Re-evaluated prior to induction Oxygen Delivery Method: Circle System Utilized Preoxygenation: Pre-oxygenation with 100% oxygen Induction Type: IV induction Ventilation: Two handed mask ventilation required and Oral airway inserted - appropriate to patient size Laryngoscope Size: Glidescope and 4 Grade View: Grade I Tube type: Oral Number of attempts: 1 Airway Equipment and Method: Stylet and Oral airway Placement Confirmation: ETT inserted through vocal cords under direct vision,  positive ETCO2 and breath sounds checked- equal and bilateral Tube secured with: Tape Dental Injury: Teeth and Oropharynx as per pre-operative assessment  Difficulty Due To: Difficulty was anticipated and Difficult Airway- due to large tongue

## 2019-03-26 NOTE — Anesthesia Postprocedure Evaluation (Signed)
Anesthesia Post Note  Patient: [name]  Procedure(s) Performed: NEPHROLITHOTOMY PERCUTANEOUS (Left )     Patient location during evaluation: PACU Anesthesia Type: General Level of consciousness: awake Pain management: pain level controlled Respiratory status: spontaneous breathing Postop Assessment: no apparent nausea or vomiting Anesthetic complications: no    Last Vitals:  Vitals:   03/26/19 1516 03/26/19 1530  BP: (!) 142/67 127/63  Pulse: 75 72  Resp: (!) 21 12  Temp:    SpO2: 100% 100%    Last Pain:  Vitals:   03/26/19 1515  TempSrc:   PainSc: 7                  Ismael Karge

## 2019-03-26 NOTE — Plan of Care (Signed)
  Problem: Elimination: Goal: Will not experience complications related to bowel motility Outcome: Progressing Goal: Will not experience complications related to urinary retention Outcome: Progressing   Problem: Pain Managment: Goal: General experience of comfort will improve Outcome: Progressing   

## 2019-03-27 ENCOUNTER — Encounter: Payer: Self-pay | Admitting: *Deleted

## 2019-03-27 DIAGNOSIS — N132 Hydronephrosis with renal and ureteral calculous obstruction: Secondary | ICD-10-CM | POA: Diagnosis not present

## 2019-03-27 LAB — BASIC METABOLIC PANEL
Anion gap: 11 (ref 5–15)
BUN: 50 mg/dL — ABNORMAL HIGH (ref 6–20)
CO2: 27 mmol/L (ref 22–32)
Calcium: 9.4 mg/dL (ref 8.9–10.3)
Chloride: 97 mmol/L — ABNORMAL LOW (ref 98–111)
Creatinine, Ser: 7.55 mg/dL — ABNORMAL HIGH (ref 0.61–1.24)
GFR calc Af Amer: 9 mL/min — ABNORMAL LOW (ref >60)
GFR calc non Af Amer: 7 mL/min — ABNORMAL LOW (ref >60)
Glucose, Bld: 104 mg/dL — ABNORMAL HIGH (ref 70–99)
Potassium: 4.7 mmol/L (ref 3.5–5.1)
Sodium: 135 mmol/L (ref 135–145)

## 2019-03-27 LAB — HEMOGLOBIN AND HEMATOCRIT, BLOOD
HCT: 33.7 % — ABNORMAL LOW (ref 39.0–52.0)
Hemoglobin: 10.5 g/dL — ABNORMAL LOW (ref 13.0–17.0)

## 2019-03-27 MED ORDER — HYDROMORPHONE HCL 1 MG/ML IJ SOLN
2.0000 mg | INTRAMUSCULAR | Status: DC | PRN
  Administered 2019-03-27 (×2): 2 mg via INTRAVENOUS
  Filled 2019-03-27 (×2): qty 2

## 2019-03-27 MED ORDER — FENTANYL 50 MCG/HR TD PT72
1.0000 | MEDICATED_PATCH | TRANSDERMAL | Status: DC
  Administered 2019-03-27: 1 via TRANSDERMAL
  Filled 2019-03-27: qty 1

## 2019-03-27 MED ORDER — PROMETHAZINE HCL 25 MG/ML IJ SOLN
25.0000 mg | INTRAMUSCULAR | Status: DC | PRN
  Administered 2019-03-27: 11:00:00 25 mg via INTRAVENOUS
  Filled 2019-03-27: qty 1

## 2019-03-27 NOTE — Op Note (Signed)
Darrell, Keith MEDICAL RECORD [id number] ACCOUNT [id number] DATE OF BIRTH:07/12/1965 FACILITY: [facility] LOCATION: [facility] PHYSICIAN:Yassin Scales Tresa Moore, MD  OPERATIVE REPORT  DATE OF PROCEDURE:  03/26/2019  SURGEON:  Alexis Frock, MD  PREOPERATIVE DIAGNOSES:   1.  Left ureteral stone.  2.  History of ileal conduit urinary diversion.  3.  End-stage renal disease.   4.  History of spina bifida.  PROCEDURES: 1.  Left percutaneous nephrostolithotomy, stone less than 2 cm. 2.  Left ureteroscopy with laser lithotripsy. 3.  Left antegrade nephrostogram with interpretation. 4.  Exchange of left ureteral stent.  ESTIMATED BLOOD LOSS:  50 mL.  COMPLICATIONS:  None.  SPECIMENS:  Left ureteral stone fragments for composition analysis.  FINDINGS: 1.  Impacted left proximal ureteral stone, quite small, only about 4 mm. 2.  Complete resolution of all accessible stone fragments larger than one-third mm following laser and basket extraction. 3.  Successful replacement of left nephrostomy tube for left nephroureteral stent.  This was capped.  INDICATIONS:  The patient is a pleasant, but quite unfortunate 54 year old man with history of congenital spinal injury, malformation with severe spina bifida.  He is status post multiple prior surgeries as a child and adult, mostly performed at  [facility] and elsewhere at another tertiary center.  He has recently moved to [place].  He was found on workup of sepsis to have a left proximal ureteral stone.  Although he has end-stage renal disease, he does make some urine.   This does help out with volume status.  He had a nephrostomy tube placed at the time as a temporizing measure.  Further options were discussed, including recommended path of left antegrade ureteroscopy and percutaneous nephrostolithotomy with goal of  left-sided stone free and he wished to proceed.  Informed consent was obtained and placed in the medical  record.  DESCRIPTION OF PROCEDURE:  The patient being identified, the procedure being left percutaneous nephrostolithotomy and ureteroscopy was confirmed.  Procedure timeout was performed.  IV antibiotics were administered.  General endotracheal anesthesia  induced.  The patient was then repositioned into a prone position, employing prone view, chest rolls, axillary rolls with foam padding in his area of right lower quadrant ileal conduit and his area of right groin hemodialysis access catheter.  Bony  prominences were padded.  Sterile field was created, prepping and draping the patient's entire right flank using chlorhexidine gluconate to over his in situ nephrostomy tube.  Initial antegrade nephrostogram was obtained.  Initial antegrade nephrostogram revealed placement of left nephrostomy tube within the left kidney.  There was essentially no forward antegrade progression of contrast, consistent with a proximal ureteral stone that was quite obstructing.  Next, an  angle-tipped ZIPwire was advanced through the nephrostomy tube, coiled in the renal pelvis.  Fluoroscopy tube was then exchanged for an angle-tipped KMP catheter.  Via the KMP catheter in multiple angulations, the ZIPwire was advanced past the area of  presumed stone and further antegrade nephrostogram revealed patency of the ureter all the way to the conduit and the KMP catheter was navigated to this level and a Super Stiff wire was then placed.  A skin incision was then made for approximately 1 cm at  the area of the access site and a dual lumen introducer was placed, over which a second ZIPwire was advanced to the level of the conduit and exchanged for a second Super Stiff wire via the KMP catheter.  Having obtained dual wire access, the NephroMax  balloon dilation apparatus was  carefully advanced into the left renal pelvis, inflated to a pressure of 25 atmospheres, held for 90 seconds and the sheath advanced to this level.  This was then  taken down and flexible nephroscopy was performed using a  flexible cystoscope.  The inner aspect of the kidney was quite atrophic with inflammation consistent with chronic tube.  There was initially no stone volume within the kidney at this point.  The flexible scope was then exchanged for a flexible digital  single-channel ureteroscope.  Inspection of the proximal ureter and revealed a quite impacted proximal stone.  This was not very large.  It was only around 4 mm.  This was consistent with prior imaging.  Holmium laser energy applied at 70, setting of 0.6  joules and 6 Hz. It was fragmented into several smaller pieces that were amenable to simple basketing with the Escape basket and the pieces were removed.  The ureteroscope was then advanced all the way to the level of the conduit.  No additional  calcifications  was noted.  This was quite fortunate and again flexible nephroscopy was performed.  Again, no evidence of intrarenal stones was noted.  We achieved the goals of surgery today.  Hemostasis was excellent.  Sponge and needle counts were  correct.  The KMP catheter was once again advanced over the safety wire to the level of the conduit and capped.  The access sheath was removed and 10 mL of FloSeal was placed along the tract.  The skin was closed at the level skin using pursestring 2-0  silk x2.  This was placed under a padded Tegaderm dressing.  The procedure was terminated.  The patient tolerated the procedure well.  No immediate perioperative complications.  The patient was taken to the postanesthesia care unit in stable condition.  We will plan for overnight admission, likely discharge home tomorrow as long as no infectious parameters.  He can receive dialysis as an outpatient.  VN/NUANCE  D:03/26/2019 T:03/26/2019 JOB:[id number]

## 2019-03-27 NOTE — Discharge Summary (Signed)
Physician Discharge Summary  Patient ID: [name] MRN: [id number] DOB/AGE: 1965/05/02 54 y.o.  Admit date: 03/26/2019 Discharge date: 03/27/2019  Admission Diagnoses: Left Renal / Ureteral Stones with history of urinary diversion  Discharge Diagnoses:  Active Problems:   Ureteral stone with hydronephrosis   Discharged Condition: fair  Hospital Course: Pt underwent LEFT percutaneous nephrostolilthotomy and antegrade ureteroscopy on 03/26/19, the day of admission, withtou acute complication through existing nephrostomy tract. By the AM of POD 1 he is at his functional baseline, pain managable (though difficult given high dose baseline chronic meds), Hgb >10, K <5, volume status acceptable, and felt to be adequate for discharge. He has hemodialysis scheduled tomorrow.   Consults: None  Significant Diagnostic Studies: labs: as per above  Treatments: surgery: as per above.   Discharge Exam: Blood pressure 116/69, pulse 73, temperature 98.5 F (36.9 C), temperature source Oral, resp. rate 18, height 5\' 11"  (1.803 m), weight 121.4 kg, SpO2 100 %. General appearance: appears older than stated age, stigmata of chronic disease, at [place].  Eyes: negative Nose: Nares normal. Septum midline. Mucosa normal. No drainage or sinus tenderness. Throat: lips, mucosa, and tongue normal; teeth and gums normal Back: neft nephroureteral stent capped. No flank hematomas. Bilateral numerous flank scars.  Resp: Non-labored on room air.  Cardio: Nl rate GI: soft, non-tender; bowel sounds normal; no masses,  no organomegaly and RLQ Urostomy patent of scant urine as per baseline.  Male genitalia: normal Extremities: stable LE foot wounds w/o super infection Lymph nodes: Cervical, supraclavicular, and axillary nodes normal.  Disposition: Discharge disposition: 01-Home or Self Care         Follow-up Information    Alexis Frock, MD On 04/08/2019.   Specialty: Urology Why: at 12:30 for MD  visit and nephroureteral stent removal.  Contact information: [place] [address] [phone]           Signed: Alexis Frock 03/27/2019, 7:10 AM

## 2019-03-28 NOTE — Anesthesia Postprocedure Evaluation (Signed)
Anesthesia Post Note  Patient: [name]  Procedure(s) Performed: NEPHROLITHOTOMY PERCUTANEOUS (Left )     Patient location during evaluation: PACU Anesthesia Type: General Level of consciousness: awake Pain management: pain level not controlled Vital Signs Assessment: post-procedure vital signs reviewed and stable Respiratory status: spontaneous breathing Cardiovascular status: stable Postop Assessment: no headache Anesthetic complications: yes Anesthetic complication details: PONV   Last Vitals:  Vitals:   03/27/19 0457 03/27/19 0823  BP: 116/69 135/66  Pulse: 73 72  Resp: 18 18  Temp: 36.9 C 37.1 C  SpO2: 100% 99%    Last Pain:  Vitals:   03/27/19 1355  TempSrc:   PainSc: 10-Worst pain ever   Pain Goal: Patients Stated Pain Goal: 3 (03/27/19 1355)                 Huston Foley

## 2019-04-26 ENCOUNTER — Other Ambulatory Visit: Payer: Self-pay | Admitting: Family Medicine

## 2019-04-30 DIAGNOSIS — R197 Diarrhea, unspecified: Secondary | ICD-10-CM | POA: Insufficient documentation

## 2019-08-27 DIAGNOSIS — T782XXA Anaphylactic shock, unspecified, initial encounter: Secondary | ICD-10-CM | POA: Insufficient documentation

## 2019-08-27 DIAGNOSIS — T7840XA Allergy, unspecified, initial encounter: Secondary | ICD-10-CM | POA: Insufficient documentation

## 2019-08-29 DIAGNOSIS — R110 Nausea: Secondary | ICD-10-CM | POA: Insufficient documentation

## 2019-09-15 DIAGNOSIS — S71102A Unspecified open wound, left thigh, initial encounter: Secondary | ICD-10-CM | POA: Insufficient documentation

## 2019-10-03 ENCOUNTER — Emergency Department (HOSPITAL_COMMUNITY): Payer: Medicare Other

## 2019-10-03 ENCOUNTER — Inpatient Hospital Stay (HOSPITAL_COMMUNITY)
Admission: EM | Admit: 2019-10-03 | Discharge: 2019-10-18 | DRG: 252 | Disposition: A | Discharge status: Home Health | Payer: Medicare Other | Source: Ambulatory Visit | Attending: Family Medicine | Admitting: Family Medicine

## 2019-10-03 ENCOUNTER — Ambulatory Visit: Payer: Medicare Other

## 2019-10-03 ENCOUNTER — Encounter (HOSPITAL_COMMUNITY): Payer: Self-pay | Admitting: Emergency Medicine

## 2019-10-03 DIAGNOSIS — Y832 Surgical operation with anastomosis, bypass or graft as the cause of abnormal reaction of the patient, or of later complication, without mention of misadventure at the time of the procedure: Secondary | ICD-10-CM | POA: Diagnosis present

## 2019-10-03 DIAGNOSIS — Z7982 Long term (current) use of aspirin: Secondary | ICD-10-CM

## 2019-10-03 DIAGNOSIS — Z888 Allergy status to other drugs, medicaments and biological substances status: Secondary | ICD-10-CM

## 2019-10-03 DIAGNOSIS — R1310 Dysphagia, unspecified: Secondary | ICD-10-CM | POA: Diagnosis present

## 2019-10-03 DIAGNOSIS — Z9115 Patient's noncompliance with renal dialysis: Secondary | ICD-10-CM

## 2019-10-03 DIAGNOSIS — I959 Hypotension, unspecified: Secondary | ICD-10-CM | POA: Diagnosis not present

## 2019-10-03 DIAGNOSIS — I25110 Atherosclerotic heart disease of native coronary artery with unstable angina pectoris: Secondary | ICD-10-CM | POA: Diagnosis not present

## 2019-10-03 DIAGNOSIS — Z992 Dependence on renal dialysis: Secondary | ICD-10-CM

## 2019-10-03 DIAGNOSIS — R509 Fever, unspecified: Secondary | ICD-10-CM

## 2019-10-03 DIAGNOSIS — Y732 Prosthetic and other implants, materials and accessory gastroenterology and urology devices associated with adverse incidents: Secondary | ICD-10-CM | POA: Diagnosis present

## 2019-10-03 DIAGNOSIS — M8580 Other specified disorders of bone density and structure, unspecified site: Secondary | ICD-10-CM | POA: Diagnosis present

## 2019-10-03 DIAGNOSIS — T8249XA Other complication of vascular dialysis catheter, initial encounter: Secondary | ICD-10-CM | POA: Diagnosis present

## 2019-10-03 DIAGNOSIS — G4700 Insomnia, unspecified: Secondary | ICD-10-CM | POA: Diagnosis present

## 2019-10-03 DIAGNOSIS — Z818 Family history of other mental and behavioral disorders: Secondary | ICD-10-CM

## 2019-10-03 DIAGNOSIS — N2581 Secondary hyperparathyroidism of renal origin: Secondary | ICD-10-CM | POA: Diagnosis present

## 2019-10-03 DIAGNOSIS — N186 End stage renal disease: Secondary | ICD-10-CM | POA: Diagnosis present

## 2019-10-03 DIAGNOSIS — I350 Nonrheumatic aortic (valve) stenosis: Secondary | ICD-10-CM | POA: Diagnosis present

## 2019-10-03 DIAGNOSIS — T827XXA Infection and inflammatory reaction due to other cardiac and vascular devices, implants and grafts, initial encounter: Secondary | ICD-10-CM | POA: Diagnosis not present

## 2019-10-03 DIAGNOSIS — Z881 Allergy status to other antibiotic agents status: Secondary | ICD-10-CM

## 2019-10-03 DIAGNOSIS — T82898A Other specified complication of vascular prosthetic devices, implants and grafts, initial encounter: Secondary | ICD-10-CM | POA: Diagnosis not present

## 2019-10-03 DIAGNOSIS — E875 Hyperkalemia: Secondary | ICD-10-CM | POA: Diagnosis not present

## 2019-10-03 DIAGNOSIS — T829XXA Unspecified complication of cardiac and vascular prosthetic device, implant and graft, initial encounter: Secondary | ICD-10-CM

## 2019-10-03 DIAGNOSIS — E669 Obesity, unspecified: Secondary | ICD-10-CM | POA: Diagnosis present

## 2019-10-03 DIAGNOSIS — Z89412 Acquired absence of left great toe: Secondary | ICD-10-CM

## 2019-10-03 DIAGNOSIS — I2510 Atherosclerotic heart disease of native coronary artery without angina pectoris: Secondary | ICD-10-CM

## 2019-10-03 DIAGNOSIS — D6959 Other secondary thrombocytopenia: Secondary | ICD-10-CM | POA: Diagnosis not present

## 2019-10-03 DIAGNOSIS — T148XXA Other injury of unspecified body region, initial encounter: Secondary | ICD-10-CM

## 2019-10-03 DIAGNOSIS — M7591 Shoulder lesion, unspecified, right shoulder: Secondary | ICD-10-CM | POA: Diagnosis present

## 2019-10-03 DIAGNOSIS — M009 Pyogenic arthritis, unspecified: Secondary | ICD-10-CM | POA: Diagnosis not present

## 2019-10-03 DIAGNOSIS — Q789 Osteochondrodysplasia, unspecified: Secondary | ICD-10-CM

## 2019-10-03 DIAGNOSIS — R079 Chest pain, unspecified: Secondary | ICD-10-CM

## 2019-10-03 DIAGNOSIS — Z833 Family history of diabetes mellitus: Secondary | ICD-10-CM

## 2019-10-03 DIAGNOSIS — Z87442 Personal history of urinary calculi: Secondary | ICD-10-CM

## 2019-10-03 DIAGNOSIS — K219 Gastro-esophageal reflux disease without esophagitis: Secondary | ICD-10-CM | POA: Diagnosis present

## 2019-10-03 DIAGNOSIS — R0789 Other chest pain: Secondary | ICD-10-CM

## 2019-10-03 DIAGNOSIS — I2584 Coronary atherosclerosis due to calcified coronary lesion: Secondary | ICD-10-CM

## 2019-10-03 DIAGNOSIS — E039 Hypothyroidism, unspecified: Secondary | ICD-10-CM | POA: Diagnosis present

## 2019-10-03 DIAGNOSIS — Z6836 Body mass index (BMI) 36.0-36.9, adult: Secondary | ICD-10-CM

## 2019-10-03 DIAGNOSIS — M25462 Effusion, left knee: Secondary | ICD-10-CM

## 2019-10-03 DIAGNOSIS — I739 Peripheral vascular disease, unspecified: Secondary | ICD-10-CM | POA: Diagnosis present

## 2019-10-03 DIAGNOSIS — M1712 Unilateral primary osteoarthritis, left knee: Secondary | ICD-10-CM | POA: Diagnosis present

## 2019-10-03 DIAGNOSIS — Z79899 Other long term (current) drug therapy: Secondary | ICD-10-CM

## 2019-10-03 DIAGNOSIS — G8929 Other chronic pain: Secondary | ICD-10-CM | POA: Diagnosis present

## 2019-10-03 DIAGNOSIS — Q059 Spina bifida, unspecified: Secondary | ICD-10-CM

## 2019-10-03 DIAGNOSIS — Z882 Allergy status to sulfonamides status: Secondary | ICD-10-CM

## 2019-10-03 DIAGNOSIS — R197 Diarrhea, unspecified: Secondary | ICD-10-CM | POA: Diagnosis not present

## 2019-10-03 DIAGNOSIS — Z79890 Hormone replacement therapy (postmenopausal): Secondary | ICD-10-CM

## 2019-10-03 DIAGNOSIS — M25562 Pain in left knee: Secondary | ICD-10-CM | POA: Insufficient documentation

## 2019-10-03 DIAGNOSIS — Z20822 Contact with and (suspected) exposure to covid-19: Secondary | ICD-10-CM | POA: Diagnosis present

## 2019-10-03 DIAGNOSIS — A419 Sepsis, unspecified organism: Secondary | ICD-10-CM

## 2019-10-03 DIAGNOSIS — N50812 Left testicular pain: Secondary | ICD-10-CM | POA: Diagnosis not present

## 2019-10-03 DIAGNOSIS — E8889 Other specified metabolic disorders: Secondary | ICD-10-CM | POA: Diagnosis present

## 2019-10-03 DIAGNOSIS — Y838 Other surgical procedures as the cause of abnormal reaction of the patient, or of later complication, without mention of misadventure at the time of the procedure: Secondary | ICD-10-CM | POA: Diagnosis present

## 2019-10-03 DIAGNOSIS — Z8249 Family history of ischemic heart disease and other diseases of the circulatory system: Secondary | ICD-10-CM

## 2019-10-03 DIAGNOSIS — D631 Anemia in chronic kidney disease: Secondary | ICD-10-CM | POA: Diagnosis present

## 2019-10-03 DIAGNOSIS — I120 Hypertensive chronic kidney disease with stage 5 chronic kidney disease or end stage renal disease: Secondary | ICD-10-CM | POA: Diagnosis present

## 2019-10-03 DIAGNOSIS — N319 Neuromuscular dysfunction of bladder, unspecified: Secondary | ICD-10-CM | POA: Diagnosis present

## 2019-10-03 DIAGNOSIS — R110 Nausea: Secondary | ICD-10-CM | POA: Diagnosis present

## 2019-10-03 DIAGNOSIS — I700 Atherosclerosis of aorta: Secondary | ICD-10-CM | POA: Diagnosis present

## 2019-10-03 DIAGNOSIS — Z885 Allergy status to narcotic agent status: Secondary | ICD-10-CM

## 2019-10-03 DIAGNOSIS — I208 Other forms of angina pectoris: Secondary | ICD-10-CM

## 2019-10-03 DIAGNOSIS — F419 Anxiety disorder, unspecified: Secondary | ICD-10-CM | POA: Diagnosis present

## 2019-10-03 DIAGNOSIS — L089 Local infection of the skin and subcutaneous tissue, unspecified: Secondary | ICD-10-CM

## 2019-10-03 HISTORY — DX: Nonrheumatic aortic (valve) stenosis: I35.0

## 2019-10-03 LAB — CBC
HCT: 34.5 % — ABNORMAL LOW (ref 39.0–52.0)
Hemoglobin: 10.1 g/dL — ABNORMAL LOW (ref 13.0–17.0)
MCH: 28.9 pg (ref 26.0–34.0)
MCHC: 29.3 g/dL — ABNORMAL LOW (ref 30.0–36.0)
MCV: 98.6 fL (ref 80.0–100.0)
Platelets: 170 10*3/uL (ref 150–400)
RBC: 3.5 MIL/uL — ABNORMAL LOW (ref 4.22–5.81)
RDW: 17.7 % — ABNORMAL HIGH (ref 11.5–15.5)
WBC: 6.4 10*3/uL (ref 4.0–10.5)
nRBC: 0 % (ref 0.0–0.2)

## 2019-10-03 LAB — BASIC METABOLIC PANEL
Anion gap: 16 — ABNORMAL HIGH (ref 5–15)
BUN: 59 mg/dL — ABNORMAL HIGH (ref 6–20)
CO2: 26 mmol/L (ref 22–32)
Calcium: 9.2 mg/dL (ref 8.9–10.3)
Chloride: 96 mmol/L — ABNORMAL LOW (ref 98–111)
Creatinine, Ser: 9.34 mg/dL — ABNORMAL HIGH (ref 0.61–1.24)
GFR calc Af Amer: 7 mL/min — ABNORMAL LOW (ref >60)
GFR calc non Af Amer: 6 mL/min — ABNORMAL LOW (ref >60)
Glucose, Bld: 90 mg/dL (ref 70–99)
Potassium: 5.5 mmol/L — ABNORMAL HIGH (ref 3.5–5.1)
Sodium: 138 mmol/L (ref 135–145)

## 2019-10-03 LAB — RESP PANEL BY RT PCR (RSV, FLU A&B, COVID)
Influenza A by PCR: NEGATIVE
Influenza B by PCR: NEGATIVE
Respiratory Syncytial Virus by PCR: NEGATIVE
SARS Coronavirus 2 by RT PCR: NEGATIVE

## 2019-10-03 MED ORDER — HEPARIN SODIUM (PORCINE) 5000 UNIT/ML IJ SOLN
5000.0000 [IU] | Freq: Three times a day (TID) | INTRAMUSCULAR | Status: DC
  Administered 2019-10-03 – 2019-10-05 (×6): 5000 [IU] via SUBCUTANEOUS
  Filled 2019-10-03 (×6): qty 1

## 2019-10-03 MED ORDER — VANCOMYCIN HCL IN DEXTROSE 1-5 GM/200ML-% IV SOLN
1000.0000 mg | INTRAVENOUS | Status: AC
  Administered 2019-10-04: 1000 mg via INTRAVENOUS
  Filled 2019-10-03 (×2): qty 200

## 2019-10-03 MED ORDER — ACETAMINOPHEN 650 MG RE SUPP: 650.0000 mg | Freq: Four times a day (QID) | RECTAL | Status: DC | PRN

## 2019-10-03 MED ORDER — OXYCODONE-ACETAMINOPHEN 5-325 MG PO TABS
1.0000 | ORAL_TABLET | Freq: Once | ORAL | Status: AC
  Administered 2019-10-03: 1 via ORAL
  Filled 2019-10-03: qty 1

## 2019-10-03 MED ORDER — ZOLPIDEM TARTRATE 5 MG PO TABS
10.0000 mg | ORAL_TABLET | Freq: Every evening | ORAL | Status: DC | PRN
  Administered 2019-10-03 – 2019-10-17 (×14): 10 mg via ORAL
  Filled 2019-10-03 (×14): qty 2

## 2019-10-03 MED ORDER — DOXERCALCIFEROL 4 MCG/2ML IV SOLN
3.0000 ug | INTRAVENOUS | Status: DC
  Administered 2019-10-15: 3 ug via INTRAVENOUS
  Filled 2019-10-03 (×2): qty 2

## 2019-10-03 MED ORDER — LEVOTHYROXINE SODIUM 25 MCG PO TABS
137.0000 ug | ORAL_TABLET | Freq: Every day | ORAL | Status: DC
  Administered 2019-10-05 – 2019-10-16 (×11): 137 ug via ORAL
  Filled 2019-10-03 (×12): qty 1

## 2019-10-03 MED ORDER — TRAZODONE HCL 100 MG PO TABS
100.0000 mg | ORAL_TABLET | Freq: Every evening | ORAL | Status: DC | PRN
  Administered 2019-10-03 – 2019-10-04 (×2): 100 mg via ORAL
  Filled 2019-10-03 (×2): qty 1

## 2019-10-03 MED ORDER — PANTOPRAZOLE SODIUM 40 MG PO TBEC: 40.0000 mg | DELAYED_RELEASE_TABLET | Freq: Every day | ORAL | Status: DC

## 2019-10-03 MED ORDER — ALTEPLASE 2 MG IJ SOLR
2.0000 mg | Freq: Once | INTRAMUSCULAR | Status: AC
  Administered 2019-10-03: 5.8 mg

## 2019-10-03 MED ORDER — DOXERCALCIFEROL 4 MCG/2ML IV SOLN
INTRAVENOUS | Status: AC
  Administered 2019-10-03: 3 ug via INTRAVENOUS
  Filled 2019-10-03: qty 2

## 2019-10-03 MED ORDER — OXYCODONE-ACETAMINOPHEN 10-325 MG PO TABS: 1.0000 | ORAL_TABLET | Freq: Four times a day (QID) | ORAL | Status: DC | PRN

## 2019-10-03 MED ORDER — ACETAMINOPHEN 325 MG PO TABS: 650.0000 mg | ORAL_TABLET | Freq: Four times a day (QID) | ORAL | Status: DC | PRN

## 2019-10-03 MED ORDER — PANTOPRAZOLE SODIUM 40 MG PO TBEC
40.0000 mg | DELAYED_RELEASE_TABLET | Freq: Every day | ORAL | Status: DC
  Administered 2019-10-05 – 2019-10-12 (×6): 40 mg via ORAL
  Filled 2019-10-03 (×7): qty 1

## 2019-10-03 MED ORDER — LIDOCAINE 5 % EX PTCH
2.0000 | MEDICATED_PATCH | Freq: Every day | CUTANEOUS | Status: DC
  Administered 2019-10-05 – 2019-10-18 (×11): 2 via TRANSDERMAL
  Filled 2019-10-03 (×16): qty 2

## 2019-10-03 MED ORDER — OXYCODONE-ACETAMINOPHEN 5-325 MG PO TABS
1.0000 | ORAL_TABLET | Freq: Four times a day (QID) | ORAL | Status: DC | PRN
  Administered 2019-10-03 – 2019-10-09 (×11): 1 via ORAL
  Filled 2019-10-03 (×11): qty 1

## 2019-10-03 MED ORDER — HEPARIN SODIUM (PORCINE) 1000 UNIT/ML IJ SOLN
INTRAMUSCULAR | Status: AC
  Administered 2019-10-03: 3000 [IU]
  Filled 2019-10-03: qty 3

## 2019-10-03 MED ORDER — CHLORHEXIDINE GLUCONATE CLOTH 2 % EX PADS
6.0000 | MEDICATED_PAD | Freq: Every day | CUTANEOUS | Status: DC
  Administered 2019-10-04 – 2019-10-18 (×14): 6 via TOPICAL

## 2019-10-03 MED ORDER — OXYCODONE HCL 5 MG PO TABS
5.0000 mg | ORAL_TABLET | Freq: Four times a day (QID) | ORAL | Status: DC | PRN
  Administered 2019-10-03 – 2019-10-09 (×11): 5 mg via ORAL
  Filled 2019-10-03 (×12): qty 1

## 2019-10-03 MED ORDER — ONDANSETRON 4 MG PO TBDP
4.0000 mg | ORAL_TABLET | Freq: Once | ORAL | Status: AC
  Administered 2019-10-03: 4 mg via ORAL
  Filled 2019-10-03: qty 1

## 2019-10-03 NOTE — Progress Notes (Signed)
New Admission Note:   Arrival Method: Arrived from [facility] ED via stretcher Mental Orientation: Alert and oriented x4 Telemetry: N/A Assessment: Completed Skin: See doc flowsheet IV: NSL-Rt FA Pain: Denies Tubes: Rt ileostomy Safety Measures: Safety Fall Prevention Plan has been discussed.  Admission: Completed 5MW Orientation: Patient has been oriented to the room, unit and staff.  Family: None at bedside  Orders have been reviewed and implemented. Will continue to monitor the patient. Call light has been placed within reach and bed alarm has been activated.   Austen Oyster [employer], RN-BC Phone number: [phone]

## 2019-10-03 NOTE — Consult Note (Signed)
[facility] Renal Consultation Note    Indication for Consultation:  Management of ESRD/hemodialysis, anemia, hypertension/volume, and secondary hyperparathyroidism.  HPI: [name] is a 54 y.o. 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 According to his outpatient dialysis nurse, patient "was not himself" this AM. He became tachycardiac and diaphoretic while waiting for cath flo to work, prompting them to call EMS. Per notes, this may have been related to anxiety over access issues.  Patient has had dialysis access issues on and off for months. He has limited vascular options at this point, last resort would be a lumbar catheter. Vascular surgery was consulted in the ED and requested we attempt to use catheter for inpatient dialysis. Pt also has an old graft in his L thigh and had completed a course of antibiotics outpatient, and was referred to vascular. He believes the drainage is worsening. No fever or chills reported.  Patient reports he has had chest pain with deep breaths since he got his second COVID vaccine approx 1 month ago. Denies any SOB, orthopnea, dizziness or weakness at present. No heart palpitations reported. Also reports worsening nausea for the past week. Labs notable for Hgb 10.1, WBC 6.4, K+ 5.5, BUN 59, Scr 9.34. BP readings variable, in 26'Z systolic at the time of my exam. CXR without any active cardiopulmonary disease. COVID test is pending.   Past Medical History:  Diagnosis Date  . Anemia   . Anxiety   . Constipation   . End stage renal failure on dialysis [facility])    [place]; MWF, on HD since 1997, exhausted upper ext access, and possibly LE access; cath dependent in R groin as of 06/2018  . GERD (gastroesophageal reflux disease)   . Grand mal seizure ([place]) X 4   last 1998 (02/29/2016)   . Heart murmur    no problems with per pt. - nephrologist and Dr. Coletta Memos  . History of blood transfusion 2000s   "I was having blood loss; never found out from where"  . History of kidney stones   . Hypertension    hx of - has not taken bp meds in over 2 years  since dialysis  . Hypothyroidism   . Insomnia   . Migraine    "due to my BP in my late 1990s; none since" (02/29/2016)  . Nonhealing surgical wound    of the left arteriovenous graft  . Pneumonia    x 2  . Spina bifida [facility])    Past Surgical History:  Procedure Laterality Date  . AMPUTATION Left 12/11/2017   Procedure: Left Great Toe Amputation;  Surgeon: [name], MD;  Location: [place];  Service: Orthopedics;  Laterality: Left;  . APPENDECTOMY    . ARTERIOVENOUS GRAFT PLACEMENT Left 10/16/2012   left femoral goretex graft         Dr Donnetta Hutching  . AV FISTULA PLACEMENT Left 06/27/2012   Procedure: EXPLORATORY LEFT THY-GRAFT PSEUDO-ANEURYSM;  Surgeon: Conrad Audubon, MD;  Location: [place];  Service: Vascular;  Laterality: Left;  Revision of left Arteriovenus gortex graft in thigh.  . COLONOSCOPY    . FLEXIBLE SIGMOIDOSCOPY N/A 12/03/2018   Procedure: FLEXIBLE SIGMOIDOSCOPY;  Surgeon: Yetta Flock, MD;  Location: [place];  Service: Gastroenterology;  Laterality: N/A;  . I & D EXTREMITY Left 02/29/2016   Procedure:  DEBRIDEMENT LEFT THIGH WOUND;  Surgeon: Waynetta Sandy, MD;  Location: [place];  Service: Vascular;  Laterality: Left;  . ILEOSTOMY  1970s  . INCISION AND DRAINAGE Left 10/16/2012   Procedure: INCISION AND Debridement left thigh graft;  Surgeon: Rosetta Posner, MD;  Location: [place];  Service: Vascular;  Laterality: Left;  . INSERTION OF DIALYSIS CATHETER    . INSERTION OF DIALYSIS CATHETER  01/14/2016   Procedure: INSERTION OF DIALYSIS CATHETER;  Surgeon: Waynetta Sandy, MD;  Location: [place];  Service: Vascular;;  . INSERTION OF DIALYSIS CATHETER Right 08/07/2017   Procedure: INSERTION OF  DIALYSIS CATHETER;  Surgeon: Waynetta Sandy, MD;  Location: [place];  Service: Vascular;  Laterality: Right;  . INSERTION OF DIALYSIS CATHETER Right 12/21/2017   Procedure: RIGHT FEMORAL DIALYSIS CATHETER EXCHANGE;  Surgeon: Marty Heck, MD;  Location: [place];  Service: Vascular;  Laterality: Right;  . INSERTION OF ILIAC STENT Left 01/14/2016   Procedure: INSERTION OF ILIAC STENT;  Surgeon: Waynetta Sandy, MD;  Location: [place];  Service: Vascular;  Laterality: Left;  . INTRAOPERATIVE ARTERIOGRAM Left 01/14/2016   Procedure: INTRA OPERATIVE ARTERIOGRAM;  Surgeon: Waynetta Sandy, MD;  Location: [place];  Service: Vascular;  Laterality: Left;  . IR NEPHROSTOMY EXCHANGE LEFT  03/21/2019  . IR NEPHROSTOMY PLACEMENT LEFT  11/28/2018  . LITHOTRIPSY  x 3   "& a laser treatment" (02/29/2016)  . NEPHROLITHOTOMY Left 03/26/2019   Procedure: NEPHROLITHOTOMY PERCUTANEOUS;  Surgeon: Alexis Frock, MD;  Location: [facility];  Service: Urology;  Laterality: Left;  2 HRS  . NEPHROSTOMY Bilateral 1968  . PATELLAR TENDON REPAIR Right 1990s   "big incision"  . PERIPHERAL VASCULAR CATHETERIZATION  01/14/2016   Procedure: A/V SHUNTOGRAM;  Surgeon: Waynetta Sandy, MD;  Location: [place];  Service: Vascular;;  . REVISION OF ARTERIOVENOUS GORETEX GRAFT Left 10/16/2012   Procedure: REVISION OF LEFT FEMORAL LOOP ARTERIOVENOUS GORETEX GRAFT;  Surgeon: Rosetta Posner, MD;  Location: [place];  Service: Vascular;  Laterality: Left;  . REVISION OF ARTERIOVENOUS GORETEX GRAFT Left 02/11/2015   Procedure: EXCISION OF SMALL SEGMENT OF EXPOSED LEFT THIGH NON FUNCTIONING  ARTERIOVENOUS GORETEX GRAFT;  Surgeon: Mal Misty, MD;  Location: [place];  Service: Vascular;  Laterality: Left;  . REVISION OF ARTERIOVENOUS GORETEX GRAFT Left 01/14/2016   Procedure: REVISION OF ARTERIOVENOUS GORETEX GRAFT;  Surgeon: Waynetta Sandy, MD;  Location: [place];  Service: Vascular;  Laterality: Left;  .  REVISION OF ARTERIOVENOUS GORETEX GRAFT Left 02/29/2016   thigh/pt report  . REVISION OF ARTERIOVENOUS GORETEX GRAFT Left 02/29/2016   Procedure: POSSIBLE REVISION OF LEFT THIGH ARTERIOVENOUS GORETEX GRAFT;  Surgeon: Waynetta Sandy, MD;  Location: [place];  Service: Vascular;  Laterality: Left;  . TEE WITHOUT CARDIOVERSION N/A 12/14/2017   Procedure: TRANSESOPHAGEAL ECHOCARDIOGRAM (TEE);  Surgeon: Acie Fredrickson, Wonda Cheng, MD;  Location: [place];  Service: Cardiovascular;  Laterality: N/A;  . THROMBECTOMY AND REVISION OF ARTERIOVENTOUS (AV) GORETEX  GRAFT Left 06/12/2018   Procedure: Removal of infected left thigh graft;  Surgeon: Rosetta Posner, MD;  Location: [place];  Service: Vascular;  Laterality: Left;  . THROMBECTOMY W/ EMBOLECTOMY Left 01/14/2016   Procedure: THROMBECTOMY ARTERIOVENOUS GORE-TEX Left thigh GRAFT;  Surgeon: Waynetta Sandy, MD;  Location: [place];  Service: Vascular;  Laterality: Left;  . TONGUE SURGERY  ~ 1990   tongue-tie release   . ULTRASOUND GUIDANCE FOR VASCULAR ACCESS Right 08/07/2017   Procedure: ULTRASOUND GUIDANCE  FOR VASCULAR CANNULATION RIGHT FEMORAL VEIN AND LEFT AV FEMORAL GRAFT;  Surgeon: Waynetta Sandy, MD;  Location: [place];  Service: Vascular;  Laterality: Right;  . UPPER EXTREMITY VENOGRAPHY Bilateral 08/09/2017   Procedure: UPPER EXTREMITY VENOGRAPHY;  Surgeon: Serafina Mitchell, MD;  Location: [place] [facility];  Service: Cardiovascular;  Laterality: Bilateral;  . VENOGRAM N/A 09/20/2017   Procedure: RIGHT COMMON FEMORAL ARTERY EXPLORATION.  CANNULATION RIGHT COMMON FEMORAL VEIN. VENOGRAM CENTRAL ULTRA SOUND GUIDED RIGHT FEMORAL VEIN TIMES TWO.;  Surgeon: Waynetta Sandy, MD;  [facility];  Service: Vascular;  Laterality: N/A;  . WOUND DEBRIDEMENT Left 02/29/2016   thigh   Family History  Problem Relation Age of Onset  . Diabetes Mother   . Hypertension Mother   . Heart disease Mother        before age 8  . Diabetes  Father   . Heart attack Father        X's 3  . Diabetes Sister   . Bipolar disorder Sister    Social History:  reports that he has never smoked. He has never used smokeless tobacco. He reports previous alcohol use. He reports previous drug use. Drug: Marijuana.  ROS: As per HPI otherwise negative.  Physical Exam: Vitals:   10/03/19 1115 10/03/19 1130 10/03/19 1200 10/03/19 1300  BP: 123/63 114/73 118/68 (!) 142/111  Pulse: 69 67 68 69  Resp: (!) 23 15 17  (!) 21  Temp:      TempSrc:      SpO2: 99% 99% 100% 99%  Weight:      Height:         General: Well developed, well nourished, in no acute distress. Head: Normocephalic, atraumatic, sclera non-icteric, mucus membranes are moist. Neck: JVD not elevated. Lungs: Clear bilaterally to auscultation without wheezes, rales, or rhonchi. Breathing is unlabored. Heart: RRR with normal S1, S2. 3/6 systolic murmur, no rubs or gallops Abdomen: Soft, mildly TTP b/l lower quadrants, non-distended with normoactive bowel sounds. No rebound/guarding. No obvious abdominal masses. Musculoskeletal:  Strength and tone appear normal for age. Lower extremities: Trace edema bilateral lower extremities Neuro: Alert and oriented X 3. Moves all extremities spontaneously. Psych:  Responds to questions appropriately with a normal affect. Dialysis Access: R thigh TDC, small draining wound at site of old L thigh AVG  Allergies  Allergen Reactions  . Hydrocodone Other (See Comments)    Caused involuntary movement and twitching. CANNOT TAKE DUE TO MUSCLE SPASMS AND MUSCLE TREMORS  . Furadantin [Nitrofurantoin] Other (See Comments)    UNSPECIFIED REACTION   . Mandelamine [Methenamine] Other (See Comments)    UNSPECIFIED REACTION   . Noroxin [Norfloxacin] Other (See Comments)    UNSPECIFIED REACTION   . Sulfa Antibiotics Other (See Comments) and Cough    Childhood reaction - pt could not confirm that it was a cough  . Sulfur Cough    Childhood reaction -  pt could not confirm that it was a cough   Prior to Admission medications   Medication Sig Start Date End Date Taking? Authorizing Provider  calcium acetate (PHOSLO) 667 MG capsule Take 3-4 capsules (2,001-2,668 mg total) by mouth See admin instructions. Take 2708 mg by mouth 3 times daily with meals, take 2001 mg by mouth with snacks Patient taking differently: Take 2,001-2,668 mg by mouth See admin instructions. Take 2,668 mg by mouth three times a day with meals and 2,001 mg with optional snacks 01/14/18   Hennie Duos, MD  famotidine (  PEPCID) 20 MG tablet Take 1 tablet (20 mg total) by mouth daily. 03/25/19   Gifford Shave, MD  fentaNYL (DURAGESIC) 50 MCG/HR Place 1 patch onto the skin every 3 (three) days. 02/25/19   [provider]  fluticasone (FLONASE) 50 MCG/ACT nasal spray Place 2 sprays into both nostrils as needed for allergies or rhinitis (congestion). Patient taking differently: Place 2 sprays into both nostrils as needed for allergies or rhinitis (or congestion).  01/14/18   Hennie Duos, MD  furosemide (LASIX) 80 MG tablet Take 80 mg by mouth 2 (two) times daily as needed (fluid).    [provider]  levothyroxine (SYNTHROID, LEVOTHROID) 125 MCG tablet Take 1 tablet (125 mcg total) by mouth daily before breakfast. 01/14/18   Hennie Duos, MD  lidocaine (LIDODERM) 5 % Place 2 patches onto the skin daily. 02/03/19   [provider]  LORazepam (ATIVAN) 1 MG tablet Take 1 mg by mouth daily as needed (anxiety).     [provider]  Nutritional Supplements (NOVASOURCE RENAL PO) Take 237 mLs by mouth daily.    [provider]  oxyCODONE-acetaminophen (PERCOCET) 10-325 MG tablet Take 1 tablet by mouth every 6 (six) hours as needed for pain. 06/14/18   Dagoberto Ligas, PA-C  promethazine (PHENERGAN) 25 MG tablet Take 1 tablet (25 mg total) by mouth 2 (two) times daily as needed for nausea or vomiting. Patient taking differently: Take 25  mg by mouth every 8 (eight) hours as needed for nausea or vomiting.  01/14/18   Hennie Duos, MD  sodium polystyrene (KAYEXALATE) powder Take 15 g by mouth See admin instructions. Take 15 g (mixed in water) by mouth on Saturdays and Sundays 01/14/18   Hennie Duos, MD  traZODone (DESYREL) 100 MG tablet Take 1 tablet (100 mg total) by mouth at bedtime. 01/14/18   Hennie Duos, MD  zolpidem (AMBIEN) 10 MG tablet Take 1 tablet (10 mg total) by mouth at bedtime. 01/14/18   Hennie Duos, MD   No current facility-administered medications for this encounter.   Current Outpatient Medications  Medication Sig Dispense Refill  . calcium acetate (PHOSLO) 667 MG capsule Take 3-4 capsules (2,001-2,668 mg total) by mouth See admin instructions. Take 2708 mg by mouth 3 times daily with meals, take 2001 mg by mouth with snacks (Patient taking differently: Take 2,001-2,668 mg by mouth See admin instructions. Take 2,668 mg by mouth three times a day with meals and 2,001 mg with optional snacks) 450 capsule 0  . famotidine (PEPCID) 20 MG tablet Take 1 tablet (20 mg total) by mouth daily. 30 tablet 0  . fentaNYL (DURAGESIC) 50 MCG/HR Place 1 patch onto the skin every 3 (three) days.    . fluticasone (FLONASE) 50 MCG/ACT nasal spray Place 2 sprays into both nostrils as needed for allergies or rhinitis (congestion). (Patient taking differently: Place 2 sprays into both nostrils as needed for allergies or rhinitis (or congestion). ) 0.003 g 0  . furosemide (LASIX) 80 MG tablet Take 80 mg by mouth 2 (two) times daily as needed (fluid).    Marland Kitchen levothyroxine (SYNTHROID, LEVOTHROID) 125 MCG tablet Take 1 tablet (125 mcg total) by mouth daily before breakfast. 30 tablet 0  . lidocaine (LIDODERM) 5 % Place 2 patches onto the skin daily.    Marland Kitchen LORazepam (ATIVAN) 1 MG tablet Take 1 mg by mouth daily as needed (anxiety).     . Nutritional Supplements (NOVASOURCE RENAL PO) Take 237 mLs by mouth  daily.    .  oxyCODONE-acetaminophen (PERCOCET) 10-325 MG tablet Take 1 tablet by mouth every 6 (six) hours as needed for pain. 10 tablet 0  . promethazine (PHENERGAN) 25 MG tablet Take 1 tablet (25 mg total) by mouth 2 (two) times daily as needed for nausea or vomiting. (Patient taking differently: Take 25 mg by mouth every 8 (eight) hours as needed for nausea or vomiting. ) 30 tablet 0  . sodium polystyrene (KAYEXALATE) powder Take 15 g by mouth See admin instructions. Take 15 g (mixed in water) by mouth on Saturdays and Sundays 135 g 0  . traZODone (DESYREL) 100 MG tablet Take 1 tablet (100 mg total) by mouth at bedtime. 30 tablet 0  . zolpidem (AMBIEN) 10 MG tablet Take 1 tablet (10 mg total) by mouth at bedtime. 7 tablet 0   Labs: Basic Metabolic Panel: Recent Labs  Lab 10/03/19 0742  NA 138  K 5.5*  CL 96*  CO2 26  GLUCOSE 90  BUN 59*  CREATININE 9.34*  CALCIUM 9.2   CBC: Recent Labs  Lab 10/03/19 0742  WBC 6.4  HGB 10.1*  HCT 34.5*  MCV 98.6  PLT 170   Studies/Results: DG Chest 2 View  Result Date: 10/03/2019 CLINICAL DATA:  Short of breath EXAM: CHEST - 2 VIEW COMPARISON:  12/07/2017 FINDINGS: The heart size and mediastinal contours are within normal limits. Both lungs are clear. The visualized skeletal structures are unremarkable. IMPRESSION: No active cardiopulmonary disease. Electronically Signed   By: Franchot Gallo M.D.   On: 10/03/2019 08:01    Dialysis Orders:  Center: [facility] on MWF. Optiflux 200NRe, Time: 4hr 18min, BFR 350, DFR 800, EDW 114.5kg, 2K/2.,25Ca, TDC heparin 3700 unit bolus Mircera 223mcg IV q 2 weeks- last dose 9/27 Venofer 100mg  q HD -last dose due today Phoslo 4 caps TO TID with meals and 3 with snacks Hectorol 3 mcg IV q HD Kayexalate 15g PO on non-dialysis days   Assessment/Plan: 1.  ESRD:  Pt did not receive dialysis today due to catheter issues as above. Per outpatient RN, attempts at readjusting catheter were limited by patient's  SOB. He is currently in NAD. We will attempt dialysis here today with heparin and Cathflo PRN. Will likely only be able to run 4 hours today due to dialysis staff/patient census. Access options are extremely limited and would prefer to salvage this catheter is able. He has also been seen by vascular surgery.  2. Hyperkalemia: K+ 5.5, pt takes kayexalate on non-dialysis today. Planned for HD today as above, continue kayexalate on weekends.  3.  Hypertension/volume: BP variable. He is 2.5kg over outpatient EDW, which was recently raised, and is reporting some SOB today. Will attempt UF goal 2.5-3L today as tolerated.  4.  Anemia: Hgb 10.1. ESA dosed 9/27, follow trend 5.  Metabolic bone disease: Calcium controlled. Continue hectorol and phoslo, follow phos 6.  Nutrition:  Renal diet with 1.2L fluid restriction recommended 7. Pleuritic chest pain/SOB: Possibly due to excess volume, though CXR is clear. Murmur on exam which is consistent with past notes. Patient believes symptoms are related to COVID vaccine. Monitor symptoms for resolution post HD.  Anice Paganini, PA-C 10/03/2019, 1:27 PM  [facility] Pager: [phone]

## 2019-10-03 NOTE — Care Management CC44 (Signed)
Condition Code 44 Documentation Completed  Patient Details  Name: Richmond Coldren MRN: [id number] Date of Birth: [birth year]   Condition Code 44 given:    Patient signature on Condition Code 44 notice:    Documentation of 2 MD's agreement:    Code 44 added to claim:       Laurena Slimmer, RN 10/03/2019, 10:02 PM

## 2019-10-03 NOTE — H&P (Signed)
[facility] Admission History and Physical Service Pager: [phone]  Patient name: [name] Medical record number: [id number] Date of birth: [birth year] Age: 54 y.o. Gender: male  Primary Care Provider: [name], MD Consultants: vascular surgery, nephrology Code Status: FULL Preferred Emergency Contact: Elizabeth Palau, sister, [phone]  Chief Complaint: Dialysis access issue  Assessment and Plan: [name] is a 54 y.o. male presenting with inability to obtain access at his right femoral dialysis catheter while at HD. PMH is significant for ESRD on HD (MWF), PAD, kidney stones, spina bifida, hx of nephrostomy tube, HTN, GERD, anxiety.  Dialysis access issue Patient with longstanding history of ESRD on HD MWF via dialysisort catheter in his right femoral, sent to the ED while at HD because they were unable to use the catheter for dialysis with concern that the catheter may be kinked.  Vascular surgery evaluated patient in the ED and noted no obvious kinks on external aspect of the catheter.  He has unfortunately exhausted options for access in his bilateral upper extremities and the left femoral and would require translumbar access if unable to access right femoral catheter. Nephrology consulted in ED. Dr. Royce Macadamia, nephrologist, will plan for dialysis tonight. If access not obtained, vascular surgery will consider catheter revision tomorrow.    - admit to FPTS, med-surg, Dr. Ardelia Mems attending - nephrology and vascular surgery following, appreciate involvement - plan for re-attempting HD today per nephrology - if unsuccessful, plan for dialysis catheter exchange tomorrow per vascular surgery - AM renal function panel - renal diet, NPO at midnight for procedure - cardiac monitoring - activity ad lib - vitals per unit routine  ESRD  On HD MWF.  Longstanding history of ESRD, has been on dialysis for about 25 years.  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 - HD as above  Left Thigh Wound Pt has a draining open wound in the left thigh from a failed left femoral graft (see image below). Vascular surgery consulted in ED and is planning to excise the graft and exploring this wound in the operating room tomorrow.  - vascular surgery following, appreciate recommendations - defer wound treatment to vascular  - monitor for signs of infection   Hyperkalemia Takes Kayexalate (powdered) on Saturdays and Sundays. Mildly hyperkalemic 5.5 on admission.  EKG in NSR without any peaked T waves or acute ST or T wave abnormalities. Etiology likely due to missed hemodialysis.  - HD as above - Cardiac monitoring  - Restart home Kayexalate when medication reconciliation complete  Dysphagia Patient reporting dysphagia over the past week, feeling food and medications stuck in his throat.  Able to drink liquids fine.  Also has been having some throat pain.  This could represent underlying esophageal stricture related to his GERD.  Posterior circulation CVA less likely given lack of focal neurological deficit.  Differential could also include obstructive neoplasm, Zenker diverticulum.  No tonsillar enlargement on exam. No drooling and patient is managing secretions. There was bilateral mandibular tenderness on exam but no lymphadenopathy. Patient has been afebrile. Not likely Ludwig angina.   - SLP eval - can consider MBS pending SLP eval results - Consider ST neck if patient becomes febrile  - If workup benign, consider ENT follow up outpatient   GERD Reports having more chest pain recently, partially due to acid reflux.  States he has been eating and lying flat right after eating more.  Reports  taking famotidine 20 mg daily. - awaiting med rec - consider switching to PPI if sx not controlled with H2 blocker  Hypothyroidism  Home medications include 137  mcg Levothyroxine.  - awaiting med rec  Anxiety Takes 1 mg Ativan daily prn for anxiety.  Likely contributing to chest pain and SOB. - awaiting med rec  Chronic pain  Takes Perocet 10 mg QID prn. - awaiting med rec  Insomnia  Takes Ambien and Trazodone  at bedtime. Reports previous hx of night terrors related to starting dialysis which resolved with trazodone.  - awaiting med rec  Nausea  Takes promethazine in the morning. - awaiting med rec  FEN/GI: renal diet, NPO at midnight for procedure Prophylaxis: Heparin  Disposition: Admit to med-surg  History of Present Illness:  [name] is a 54 y.o. male presenting with problems with his dialysis catheter.  Patient went to dialysis today and they couldn't get it to run. They clamped it in the same place.  Reports a RN at his dialysis center forgot to clamp it and blood shot out. Port-a-cath placed in [place] at the dialysis institute. Patient has been on dialysis for about 25 years. Pt was born with neurogenic bladder. Ambulates without assistance. Hx of recurrent kidney stones requiring nephrostomy tubes.  Patient reports sharp chest pain and shortness of breath for the past week. Endorses orthopnea and has been sleeping more elevated in his home hospital bed, though he states sx are not similar to when he is "volume overloaded."  Takes 160 mg Lasix. Produces small amounts of urine. Has been lightheaded and blurry vision with his dialysis treatment on Monday and Wednesday but resolved when he took his hat off.  Denies fever, chills. Nonexertional chest pain. Hurts to breathe in deep.    Patient is fully vaccinated against COVID.  Patient states sometimes pills get caught on the right side of his throat. Able to eat and drink fluids. Sx worse when he eats a lot of pepper. Reports he eats laying down. Has hx of GERD.   In the ED, CXR was unremarkable. ED physician consulted vascular surgery and nephrologist. Patient admitted for HD and  vascular access evaluation.   Review Of Systems: Per HPI with the following additions:  Review of Systems  Constitutional: Negative for chills and fever.  HENT: Negative for sore throat.   Eyes:       Wears glasses   Respiratory: Positive for cough and shortness of breath.   Cardiovascular: Positive for chest pain.  Gastrointestinal: Positive for abdominal pain (left).  Genitourinary: Positive for flank pain (right).  Musculoskeletal: Positive for joint pain.  Skin: Negative for rash.  Neurological: Negative for headaches.  Psychiatric/Behavioral: The patient has insomnia.   All other systems reviewed and are negative.   Patient Active Problem List   Diagnosis Date Noted  . Complication associated with dialysis catheter   . Hyperkalemia   . Dysphagia   . Ureteral stone with hydronephrosis 03/26/2019  . Malfunction of nephrostomy tube ([place])   . Nephrostomy complication ([place]) 38/75/6433  . Abnormal CT scan, gastrointestinal tract   . Flank pain 11/26/2018  . Nephrolithiasis 11/26/2018  . Hydronephrosis with renal and ureteral calculus obstruction 11/26/2018  . Left flank pain 11/26/2018  . ESRD on dialysis ([place]) 06/12/2018  . PAD (peripheral artery disease) ([place]) 12/22/2017  . Problem with dialysis access ([place]) 12/21/2017  . Anemia 12/21/2017  . Hypothyroidism 12/21/2017  . GERD (gastroesophageal reflux disease) 12/21/2017  . Anxiety 12/21/2017  .  Left great toe amputee ([place]) 12/19/2017  . Endocarditis of mitral valve   . Osteomyelitis of toe of left foot ([place])   . MSSA bacteremia 12/08/2017  . Fever   . Right flank pain   . ESRD (end stage renal disease) ([place]) 09/20/2017  . ESRD (end stage renal disease) on dialysis ([place]) 08/07/2017  . Nonhealing surgical wound 02/29/2016  . Clotted dialysis access ([place]) 01/14/2016  . Removal of staples 07/19/2012  . Complication of AV dialysis fistula 06/27/2012  . End stage renal disease ([place]) 06/27/2012    Past Medical  History: Past Medical History:  Diagnosis Date  . Anemia   . Anxiety   . Constipation   . End stage renal failure on dialysis [facility])    [place]; MWF, on HD since 1997, exhausted upper ext access, and possibly LE access; cath dependent in R groin as of 06/2018  . GERD (gastroesophageal reflux disease)   . Grand mal seizure ([place]) X 4   last 1998 (02/29/2016)  . Heart murmur    no problems with per pt. - nephrologist and Dr. Coletta Memos  . History of blood transfusion 2000s   "I was having blood loss; never found out from where"  . History of kidney stones   . Hypertension    hx of - has not taken bp meds in over 2 years  since dialysis  . Hypothyroidism   . Insomnia   . Migraine    "due to my BP in my late 1990s; none since" (02/29/2016)  . Nonhealing surgical wound    of the left arteriovenous graft  . Pneumonia    x 2  . Spina bifida [facility])     Past Surgical History: Past Surgical History:  Procedure Laterality Date  . AMPUTATION Left 12/11/2017   Procedure: Left Great Toe Amputation;  Surgeon: [name], MD;  Location: [place];  Service: Orthopedics;  Laterality: Left;  . APPENDECTOMY    . ARTERIOVENOUS GRAFT PLACEMENT Left 10/16/2012   left femoral goretex graft         Dr Donnetta Hutching  . AV FISTULA PLACEMENT Left 06/27/2012   Procedure: EXPLORATORY LEFT THY-GRAFT PSEUDO-ANEURYSM;  Surgeon: Conrad Polk, MD;  Location: [place];  Service: Vascular;  Laterality: Left;  Revision of left Arteriovenus gortex graft in thigh.  . COLONOSCOPY    . FLEXIBLE SIGMOIDOSCOPY N/A 12/03/2018   Procedure: FLEXIBLE SIGMOIDOSCOPY;  Surgeon: Yetta Flock, MD;  Location: [place];  Service: Gastroenterology;  Laterality: N/A;  . I & D EXTREMITY Left 02/29/2016   Procedure: DEBRIDEMENT LEFT THIGH WOUND;  Surgeon: Waynetta Sandy, MD;  Location: [place];  Service: Vascular;  Laterality: Left;  . ILEOSTOMY  1970s  . INCISION AND DRAINAGE Left 10/16/2012   Procedure: INCISION AND  Debridement left thigh graft;  Surgeon: Rosetta Posner, MD;  Location: [place];  Service: Vascular;  Laterality: Left;  . INSERTION OF DIALYSIS CATHETER    . INSERTION OF DIALYSIS CATHETER  01/14/2016   Procedure: INSERTION OF DIALYSIS CATHETER;  Surgeon: Waynetta Sandy, MD;  Location: [place];  Service: Vascular;;  . INSERTION OF DIALYSIS CATHETER Right 08/07/2017   Procedure: INSERTION OF DIALYSIS CATHETER;  Surgeon: Waynetta Sandy, MD;  Location: [place];  Service: Vascular;  Laterality: Right;  . INSERTION OF DIALYSIS CATHETER Right 12/21/2017   Procedure: RIGHT FEMORAL DIALYSIS CATHETER EXCHANGE;  Surgeon: Marty Heck, MD;  Location: [place];  Service: Vascular;  Laterality: Right;  . INSERTION  OF ILIAC STENT Left 01/14/2016   Procedure: INSERTION OF ILIAC STENT;  Surgeon: Waynetta Sandy, MD;  Location: [place];  Service: Vascular;  Laterality: Left;  . INTRAOPERATIVE ARTERIOGRAM Left 01/14/2016   Procedure: INTRA OPERATIVE ARTERIOGRAM;  Surgeon: Waynetta Sandy, MD;  Location: [place];  Service: Vascular;  Laterality: Left;  . IR NEPHROSTOMY EXCHANGE LEFT  03/21/2019  . IR NEPHROSTOMY PLACEMENT LEFT  11/28/2018  . LITHOTRIPSY  x 3   "& a laser treatment" (02/29/2016)  . NEPHROLITHOTOMY Left 03/26/2019   Procedure: NEPHROLITHOTOMY PERCUTANEOUS;  Surgeon: Alexis Frock, MD;  Location: [facility];  Service: Urology;  Laterality: Left;  2 HRS  . NEPHROSTOMY Bilateral 1968  . PATELLAR TENDON REPAIR Right 1990s   "big incision"  . PERIPHERAL VASCULAR CATHETERIZATION  01/14/2016   Procedure: A/V SHUNTOGRAM;  Surgeon: Waynetta Sandy, MD;  Location: [place];  Service: Vascular;;  . REVISION OF ARTERIOVENOUS GORETEX GRAFT Left 10/16/2012   Procedure: REVISION OF LEFT FEMORAL LOOP ARTERIOVENOUS GORETEX GRAFT;  Surgeon: Rosetta Posner, MD;  Location: [place];  Service: Vascular;  Laterality: Left;  . REVISION OF ARTERIOVENOUS GORETEX GRAFT Left 02/11/2015   Procedure:  EXCISION OF SMALL SEGMENT OF EXPOSED LEFT THIGH NON FUNCTIONING  ARTERIOVENOUS GORETEX GRAFT;  Surgeon: Mal Misty, MD;  Location: [place];  Service: Vascular;  Laterality: Left;  . REVISION OF ARTERIOVENOUS GORETEX GRAFT Left 01/14/2016   Procedure: REVISION OF ARTERIOVENOUS GORETEX GRAFT;  Surgeon: Waynetta Sandy, MD;  Location: [place];  Service: Vascular;  Laterality: Left;  . REVISION OF ARTERIOVENOUS GORETEX GRAFT Left 02/29/2016   thigh/pt report  . REVISION OF ARTERIOVENOUS GORETEX GRAFT Left 02/29/2016   Procedure: POSSIBLE REVISION OF LEFT THIGH ARTERIOVENOUS GORETEX GRAFT;  Surgeon: Waynetta Sandy, MD;  Location: [place];  Service: Vascular;  Laterality: Left;  . TEE WITHOUT CARDIOVERSION N/A 12/14/2017   Procedure: TRANSESOPHAGEAL ECHOCARDIOGRAM (TEE);  Surgeon: Acie Fredrickson, Wonda Cheng, MD;  Location: [place];  Service: Cardiovascular;  Laterality: N/A;  . THROMBECTOMY AND REVISION OF ARTERIOVENTOUS (AV) GORETEX  GRAFT Left 06/12/2018   Procedure: Removal of infected left thigh graft;  Surgeon: Rosetta Posner, MD;  Location: [place];  Service: Vascular;  Laterality: Left;  . THROMBECTOMY W/ EMBOLECTOMY Left 01/14/2016   Procedure: THROMBECTOMY ARTERIOVENOUS GORE-TEX Left thigh GRAFT;  Surgeon: Waynetta Sandy, MD;  Location: [place];  Service: Vascular;  Laterality: Left;  . TONGUE SURGERY  ~ 1990   tongue-tie release   . ULTRASOUND GUIDANCE FOR VASCULAR ACCESS Right 08/07/2017   Procedure: ULTRASOUND GUIDANCE FOR VASCULAR CANNULATION RIGHT FEMORAL VEIN AND LEFT AV FEMORAL GRAFT;  Surgeon: Waynetta Sandy, MD;  Location: [place];  Service: Vascular;  Laterality: Right;  . UPPER EXTREMITY VENOGRAPHY Bilateral 08/09/2017   Procedure: UPPER EXTREMITY VENOGRAPHY;  Surgeon: Serafina Mitchell, MD;  Location: [place] [facility];  Service: Cardiovascular;  Laterality: Bilateral;  . VENOGRAM N/A 09/20/2017   Procedure: RIGHT COMMON FEMORAL ARTERY EXPLORATION.  CANNULATION  RIGHT COMMON FEMORAL VEIN. VENOGRAM CENTRAL ULTRA SOUND GUIDED RIGHT FEMORAL VEIN TIMES TWO.;  Surgeon: Waynetta Sandy, MD;  Location: [place];  Service: Vascular;  Laterality: N/A;  . WOUND DEBRIDEMENT Left 02/29/2016   thigh    Social History: Social History   Tobacco Use  . Smoking status: Never Smoker  . Smokeless tobacco: Never Used  Vaping Use  . Vaping Use: Never used  Substance Use Topics  . Alcohol use: Not Currently    Alcohol/week: 0.0  standard drinks    Comment: 02/29/2016 "nothing since  the mid 1990s  . Drug use: Not Currently    Types: Marijuana    Comment: 02/29/2016 "nothing since ~ 1995"   Additional social history: No THC use in last 5 years.  Please also refer to relevant sections of EMR.  Family History: Family History  Problem Relation Age of Onset  . Diabetes Mother   . Hypertension Mother   . Heart disease Mother        before age 41  . Diabetes Father   . Heart attack Father        X's 3  . Diabetes Sister   . Bipolar disorder Sister     Allergies and Medications: Allergies  Allergen Reactions  . Hydrocodone Other (See Comments)    Caused involuntary movement and twitching. CANNOT TAKE DUE TO MUSCLE SPASMS AND MUSCLE TREMORS  . Furadantin [Nitrofurantoin] Other (See Comments)    UNSPECIFIED REACTION   . Mandelamine [Methenamine] Other (See Comments)    UNSPECIFIED REACTION   . Noroxin [Norfloxacin] Other (See Comments)    UNSPECIFIED REACTION   . Sulfa Antibiotics Other (See Comments) and Cough    Childhood reaction - pt could not confirm that it was a cough  . Sulfur Cough    Childhood reaction - pt could not confirm that it was a cough   No current facility-administered medications on file prior to encounter.   Current Outpatient Medications on File Prior to Encounter  Medication Sig Dispense Refill  . aspirin 325 MG tablet Take 325 mg by mouth daily.    . calcium acetate (PHOSLO) 667 MG capsule Take 3-4 capsules  (2,001-2,668 mg total) by mouth See admin instructions. Take 2708 mg by mouth 3 times daily with meals, take 2001 mg by mouth with snacks (Patient taking differently: Take 2,001-2,668 mg by mouth See admin instructions. Take 2,668 mg by mouth three times a day with meals and 2,001 mg with optional snacks) 450 capsule 0  . clopidogrel (PLAVIX) 75 MG tablet Take 75 mg by mouth daily.    . famotidine (PEPCID) 20 MG tablet Take 1 tablet (20 mg total) by mouth daily. 30 tablet 0  . fluticasone (FLONASE) 50 MCG/ACT nasal spray Place 2 sprays into both nostrils as needed for allergies or rhinitis (congestion). (Patient taking differently: Place 2 sprays into both nostrils as needed for allergies or rhinitis (or congestion). ) 0.003 g 0  . furosemide (LASIX) 80 MG tablet Take 80 mg by mouth 2 (two) times daily as needed (fluid).    Marland Kitchen levothyroxine (SYNTHROID) 137 MCG tablet Take 137 mcg by mouth daily before breakfast.    . LORazepam (ATIVAN) 1 MG tablet Take 1 mg by mouth daily as needed (anxiety).     . Nutritional Supplements (NEPRO) LIQD Take 237 mLs by mouth daily. Mixed Berry    . oxyCODONE-acetaminophen (PERCOCET) 10-325 MG tablet Take 1 tablet by mouth every 6 (six) hours as needed for pain. 10 tablet 0  . promethazine (PHENERGAN) 25 MG tablet Take 1 tablet (25 mg total) by mouth 2 (two) times daily as needed for nausea or vomiting. (Patient taking differently: Take 25 mg by mouth every 8 (eight) hours as needed for nausea or vomiting. ) 30 tablet 0  . sodium polystyrene (KAYEXALATE) powder Take 15 g by mouth See admin instructions. Take 15 g (mixed in water) by mouth on Saturdays and Sundays 135 g 0  . traZODone (DESYREL) 100 MG tablet Take 1  tablet (100 mg total) by mouth at bedtime. 30 tablet 0  . zolpidem (AMBIEN) 10 MG tablet Take 1 tablet (10 mg total) by mouth at bedtime. 7 tablet 0  . levothyroxine (SYNTHROID, LEVOTHROID) 125 MCG tablet Take 1 tablet (125 mcg total) by mouth daily before  breakfast. (Patient not taking: Reported on 10/03/2019) 30 tablet 0    Objective: BP 108/64 (BP Location: Left Arm)   Pulse 68   Temp 99.9 F (37.7 C) (Oral)   Resp 18   Ht 5\' 11"  (1.803 m)   Wt 117 kg   SpO2 96%   BMI 35.98 kg/m  Exam: General: Alert, obese male resting in bed comfortably, NAD Eyes: PERRL, EOMI, wearing glasses  ENTM: MMM, no tonsillar enlargement, uvula midline, tenderness below jaw line bilaterally Neck: Supple Cardiovascular: RRR, 3/6 systolic murmur best heard at the LUSB Respiratory: CTAB, no wheezes or rales Gastrointestinal: soft, mild diffuse tenderness, +BS MSK: Moves all extremities, 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 Derm: Port catheter on right thigh without swelling, erythema, or drainage and without obvious kinking; well-healed scars at access sites bilateral upper extremities; left back with percutaneous tube wound; dry, scaly skin prominent on back and arms; left thigh: see image below Neuro: Alert, interactive, CN II-XII intact Psych: Affect appropriate  Left thigh    Labs and Imaging: CBC BMET  Recent Labs  Lab 10/03/19 0742  WBC 6.4  HGB 10.1*  HCT 34.5*  PLT 170   Recent Labs  Lab 10/03/19 0742  NA 138  K 5.5*  CL 96*  CO2 26  BUN 59*  CREATININE 9.34*  GLUCOSE 90  CALCIUM 9.2     EKG: NSR  DG Chest 2 View  Result Date: 10/03/2019 CLINICAL DATA:  Short of breath EXAM: CHEST - 2 VIEW COMPARISON:  12/07/2017 FINDINGS: The heart size and mediastinal contours are within normal limits. Both lungs are clear. The visualized skeletal structures are unremarkable. IMPRESSION: No active cardiopulmonary disease. Electronically Signed   By: Franchot Gallo M.D.   On: 10/03/2019 08:01     Zola Button, MD 10/03/2019, 8:10 PM PGY-1, [place] Intern pager: [phone], text pages welcome  FPTS Upper-Level Resident Addendum   I have independently interviewed and examined the patient. I have discussed  the above with the original author and agree with their documentation. My edits for correction/addition/clarification are in blue Please see also any attending notes.   Lyndee Hensen, DO PGY-2, [facility] 10/03/2019 10:20 PM  FPTS Service pager: [phone] (text pages welcome through [place])       .

## 2019-10-03 NOTE — Progress Notes (Addendum)
Renal Navigator appreciates contact from ED PA who states patient is here for HD access problems. Plan for VVS consult today and if issue can be resolved with discharge from ED today, Renal Navigator has patient rescheduled for OP HD [facility] for [facility] 10/04/19 first shift. Renal Navigator has arranged [facility] for transportation with a pick up window from patient's home from 5:00am-5:30am. His return window is 11:30am-12:00pm.  If there is an change in discharge plan, Navigator will cancel this plan. ED PA aware.  Update: above plan cancelled as Navigator has been updated that patient will be admitted for VVS procedure tomorrow. Clinic and [facility] update.  [name], [place] Renal Navigator [phone]

## 2019-10-03 NOTE — Consult Note (Signed)
Vascular and Vein Specialist of [facility]  Patient name: [name] MRN: [id number] DOB: 27-May-1965 Sex: male   REQUESTING PROVIDER:    ED   REASON FOR CONSULT:    ESRD  HISTORY OF PRESENT ILLNESS:   [name] is a 54 y.o. male, who is very well-known to our practice for his dialysis access.  He has exhausted options in bilateral upper extremities and the left thigh.  He has been receiving treatments through a catheter in his right groin.  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  He has been using this for several years.  It was exchanged by Dr. Carlis Abbott in 2019.  This was a very difficult exchange.  Subsequent to that he had a new catheter placed in [place].  Today they were unable to use the catheter for dialysis and were concerned that it may be kinked and so he was sent to the ER.  The patient states that prior to today he has not had any issues with this catheter  The patient also has a chronic wound from a retained graft in his left thigh.  PAST MEDICAL HISTORY    Past Medical History:  Diagnosis Date  . Anemia   . Anxiety   . Constipation   . End stage renal failure on dialysis [facility])    [place]; MWF, on HD since 1997, exhausted upper ext access, and possibly LE access; cath dependent in R groin as of 06/2018  . GERD (gastroesophageal reflux disease)   . Grand mal seizure ([place]) X 4   last 1998 (02/29/2016)  . Heart murmur    no problems with per pt. - nephrologist and Dr. Coletta Memos  . History of blood transfusion 2000s   "I was having blood loss; never found out from where"  . History of kidney stones   . Hypertension    hx of - has not taken bp meds in over 2 years  since dialysis  . Hypothyroidism   . Insomnia   . Migraine    "due to my BP in my late 1990s; none since" (02/29/2016)  . Nonhealing surgical wound    of the left  arteriovenous graft  . Pneumonia    x 2  . Spina bifida ([place])      FAMILY HISTORY   Family History  Problem Relation Age of Onset  . Diabetes Mother   . Hypertension Mother   . Heart disease Mother        before age 82  . Diabetes Father   . Heart attack Father        X's 3  . Diabetes Sister   . Bipolar disorder Sister     SOCIAL HISTORY:   Social History   Socioeconomic History  . Marital status: Single    Spouse name: Not on file  . Number of children: 0  . Years of education: Not on file  . Highest education level: Not on file  Occupational History  . Occupation: Disabled  Tobacco Use  . Smoking status: Never Smoker  . Smokeless tobacco: Never Used  Vaping Use  . Vaping Use: Never used  Substance and Sexual Activity  . Alcohol use: Not Currently    Alcohol/week: 0.0 standard drinks    Comment: 02/29/2016 "nothing since  the mid 1990s  . Drug use: Not Currently    Types: Marijuana    Comment: 02/29/2016 "  nothing since ~ 1995"  . Sexual activity: Yes  Other Topics Concern  . Not on file  Social History Narrative  . Not on file   Social Determinants of Health   Financial Resource Strain:   . Difficulty of Paying Living Expenses: Not on file  Food Insecurity:   . Worried About Charity fundraiser in the Last Year: Not on file  . Ran Out of Food in the Last Year: Not on file  Transportation Needs:   . Lack of Transportation (Medical): Not on file  . Lack of Transportation (Non-Medical): Not on file  Physical Activity:   . Days of Exercise per Week: Not on file  . Minutes of Exercise per Session: Not on file  Stress:   . Feeling of Stress : Not on file  Social Connections:   . Frequency of Communication with Friends and Family: Not on file  . Frequency of Social Gatherings with Friends and Family: Not on file  . Attends Religious Services: Not on file  . Active Member of Clubs or Organizations: Not on file  . Attends Archivist Meetings:  Not on file  . Marital Status: Not on file  Intimate Partner Violence:   . Fear of Current or Ex-Partner: Not on file  . Emotionally Abused: Not on file  . Physically Abused: Not on file  . Sexually Abused: Not on file    ALLERGIES:    Allergies  Allergen Reactions  . Hydrocodone Other (See Comments)    Caused involuntary movement and twitching. CANNOT TAKE DUE TO MUSCLE SPASMS AND MUSCLE TREMORS  . Furadantin [Nitrofurantoin] Other (See Comments)    UNSPECIFIED REACTION   . Mandelamine [Methenamine] Other (See Comments)    UNSPECIFIED REACTION   . Noroxin [Norfloxacin] Other (See Comments)    UNSPECIFIED REACTION   . Sulfa Antibiotics Other (See Comments) and Cough    Childhood reaction - pt could not confirm that it was a cough  . Sulfur Cough    Childhood reaction - pt could not confirm that it was a cough    CURRENT MEDICATIONS:    Current Facility-Administered Medications  Medication Dose Route Frequency Provider Last Rate Last Admin  . [START ON 10/04/2019] Chlorhexidine Gluconate Cloth 2 % PADS 6 each  6 each Topical Q0600 Collins, Hervey Ard, PA-C      . [START ON 10/06/2019] doxercalciferol (HECTOROL) injection 3 mcg  3 mcg Intravenous Q M,W,F-HD Collins, Hervey Ard, PA-C       Current Outpatient Medications  Medication Sig Dispense Refill  . calcium acetate (PHOSLO) 667 MG capsule Take 3-4 capsules (2,001-2,668 mg total) by mouth See admin instructions. Take 2708 mg by mouth 3 times daily with meals, take 2001 mg by mouth with snacks (Patient taking differently: Take 2,001-2,668 mg by mouth See admin instructions. Take 2,668 mg by mouth three times a day with meals and 2,001 mg with optional snacks) 450 capsule 0  . famotidine (PEPCID) 20 MG tablet Take 1 tablet (20 mg total) by mouth daily. 30 tablet 0  . fentaNYL (DURAGESIC) 50 MCG/HR Place 1 patch onto the skin every 3 (three) days.    . fluticasone (FLONASE) 50 MCG/ACT nasal spray Place 2 sprays into both nostrils  as needed for allergies or rhinitis (congestion). (Patient taking differently: Place 2 sprays into both nostrils as needed for allergies or rhinitis (or congestion). ) 0.003 g 0  . furosemide (LASIX) 80 MG tablet Take 80 mg by mouth 2 (two) times  daily as needed (fluid).    Marland Kitchen levothyroxine (SYNTHROID, LEVOTHROID) 125 MCG tablet Take 1 tablet (125 mcg total) by mouth daily before breakfast. 30 tablet 0  . lidocaine (LIDODERM) 5 % Place 2 patches onto the skin daily.    Marland Kitchen LORazepam (ATIVAN) 1 MG tablet Take 1 mg by mouth daily as needed (anxiety).     . Nutritional Supplements (NOVASOURCE RENAL PO) Take 237 mLs by mouth daily.    Marland Kitchen oxyCODONE-acetaminophen (PERCOCET) 10-325 MG tablet Take 1 tablet by mouth every 6 (six) hours as needed for pain. 10 tablet 0  . promethazine (PHENERGAN) 25 MG tablet Take 1 tablet (25 mg total) by mouth 2 (two) times daily as needed for nausea or vomiting. (Patient taking differently: Take 25 mg by mouth every 8 (eight) hours as needed for nausea or vomiting. ) 30 tablet 0  . sodium polystyrene (KAYEXALATE) powder Take 15 g by mouth See admin instructions. Take 15 g (mixed in water) by mouth on Saturdays and Sundays 135 g 0  . traZODone (DESYREL) 100 MG tablet Take 1 tablet (100 mg total) by mouth at bedtime. 30 tablet 0  . zolpidem (AMBIEN) 10 MG tablet Take 1 tablet (10 mg total) by mouth at bedtime. 7 tablet 0    REVIEW OF SYSTEMS:   [X]  denotes positive finding, [ ]  denotes negative finding Cardiac  Comments:  Chest pain or chest pressure:    Shortness of breath upon exertion:    Short of breath when lying flat:    Irregular heart rhythm:        Vascular    Pain in calf, thigh, or hip brought on by ambulation:    Pain in feet at night that wakes you up from your sleep:     Blood clot in your veins:    Leg swelling:         Pulmonary    Oxygen at home:    Productive cough:     Wheezing:         Neurologic    Sudden weakness in arms or legs:     Sudden  numbness in arms or legs:     Sudden onset of difficulty speaking or slurred speech:    Temporary loss of vision in one eye:     Problems with dizziness:         Gastrointestinal    Blood in stool:      Vomited blood:         Genitourinary    Burning when urinating:     Blood in urine:        Psychiatric    Major depression:         Hematologic    Bleeding problems:    Problems with blood clotting too easily:        Skin    Rashes or ulcers:        Constitutional    Fever or chills:     PHYSICAL EXAM:   Vitals:   10/03/19 1115 10/03/19 1130 10/03/19 1200 10/03/19 1300  BP: 123/63 114/73 118/68 (!) 142/111  Pulse: 69 67 68 69  Resp: (!) 23 15 17  (!) 21  Temp:      TempSrc:      SpO2: 99% 99% 100% 99%  Weight:      Height:        GENERAL: The patient is a well-nourished male, in no acute distress. The vital signs are documented above. CARDIAC: There is a  regular rate and rhythm.  VASCULAR: I inspected the right thigh catheter.  I do not see any kinks on the external aspect of the catheter which was the area of concern. PULMONARY: Nonlabored respirations ABDOMEN: Soft and non-tender with normal pitched bowel sounds.  MUSCULOSKELETAL: There are no major deformities or cyanosis. NEUROLOGIC: No focal weakness or paresthesias are detected. SKIN: There is a chronic wound in the left medial thigh with residual underlying dialysis graft PSYCHIATRIC: The patient has a normal affect.  STUDIES:   none  ASSESSMENT and PLAN   This is a very difficult situation given the patient's limited options for access.  He is aware that when we are unable to use the catheter in the right groin that he will require translumbar access.  Since I do not see any obvious kinks on the external aspect of the catheter, which was the area of concern, I have recommended trying to dialyze him here in the hospital.  If this is successful, I would not try to exchange the catheter given the issues we  have encountered with exchanging this in the past.  If they are unable to dialyze him today, I will plan on dialysis catheter exchange in the morning.  In addition, he has a draining open wound in the left thigh from an old graft site.  I will plan on excising the graft and exploring this wound in the operating room tomorrow.  He needs to be n.p.o. after midnight   Leia Alf, MD, FACS [facility] [phone] Pager [phone]

## 2019-10-03 NOTE — Progress Notes (Signed)
   10/03/19 1910  Vitals  Temp 99.3 F (37.4 C)  Temp Source Oral  BP (!) 109/56  BP Location Right Arm  BP Method Automatic  Patient Position (if appropriate) Lying  Pulse Rate 68  Pulse Rate Source Monitor  ECG Heart Rate 68  Resp 15  Oxygen Therapy  SpO2 98 %  O2 Device Room Air  Dialysis Weight  Weight  (unable to weigh)  Post-Hemodialysis Assessment  Rinseback Volume (mL) 250 mL  KECN 162 V  Dialyzer Clearance Lightly streaked  Duration of HD Treatment -hour(s) 4 hour(s)  Hemodialysis Intake (mL) 700 mL  UF Total -Machine (mL) 3331 mL  Net UF (mL) 2631 mL  Tolerated HD Treatment Yes  Post-Hemodialysis Comments tx terminated with 27 minutes left-pt stable  HD tx terminated early per pt request, pt c/o pain to legs and back, tylenol offered, pt declined. Dr. Royce Macadamia contacted with pt request for pain med or he will terminate tx prematurely. Per Dr. Royce Macadamia patient can have tylenol if needed for pain and can sign off however it will be AMA. Pt educated on importance of completing tx, however again declined tylenol and decided to terminate tx. HD cath with issues entire tx, unable to increase BFR above 200 for the majority of tx. High arterial pressures noted, frequent alarms. Pt repositioned frequently, hd cath flushed frequently with minimal effects. Pt noted sitting upright with leg crossed for last 45 minutes and BFR able to be increased to 250. Pt noted with cathflo in cath prior to arrival and also HD cath locked with cathflo and end of tx. UF=2.5 liters net.

## 2019-10-03 NOTE — ED Triage Notes (Addendum)
Patient arrives via [facility] from dialysis center where he did not get any of his session, ems states that patient's pastor came and prayed with him and patient got upset and started hyperventilating. Pt however, states that his dialysis access port is not working properly. Also c/o wound to L leg/L lower back that is infected. Supposed to see doctor regarding infection today. Pt c/o sob and states he has had it since his pfizer vaccine. A/ox4, resp e/u, patient speaking in full sentences. EMS VSS, O2 sat 100% on room air.

## 2019-10-03 NOTE — ED Notes (Signed)
Providers at bedside.

## 2019-10-03 NOTE — Anesthesia Preprocedure Evaluation (Addendum)
Anesthesia Evaluation  Patient identified by MRN, date of birth, ID band Patient awake    Reviewed: Allergy & Precautions, NPO status , Patient's Chart, lab work & pertinent test results  Airway Mallampati: III  TM Distance: >3 FB Neck ROM: Full    Dental  (+) Teeth Intact, Poor Dentition, Dental Advisory Given   Pulmonary    breath sounds clear to auscultation       Cardiovascular hypertension, + Peripheral Vascular Disease   Rhythm:Regular Rate:Normal     Neuro/Psych  Headaches, Seizures -,  Anxiety    GI/Hepatic Neg liver ROS, GERD  Medicated,  Endo/Other  Hypothyroidism   Renal/GU ESRF and DialysisRenal disease     Musculoskeletal   Abdominal (+) + obese,   Peds  Hematology negative hematology ROS (+) anemia ,   Anesthesia Other Findings   Reproductive/Obstetrics                            Anesthesia Physical Anesthesia Plan  ASA: III  Anesthesia Plan: General   Post-op Pain Management:    Induction: Intravenous  PONV Risk Score and Plan: 3 and Dexamethasone, Ondansetron and Midazolam  Airway Management Planned: Oral ETT  Additional Equipment: None  Intra-op Plan:   Post-operative Plan: Extubation in OR  Informed Consent: I have reviewed the patients History and Physical, chart, labs and discussed the procedure including the risks, benefits and alternatives for the proposed anesthesia with the patient or authorized representative who has indicated his/her understanding and acceptance.       Plan Discussed with: CRNA  Anesthesia Plan Comments: (Echo:  - Left ventricle: The cavity size was normal. Wall thickness was  normal. Systolic function was normal.  - Aortic valve: Valve mobility was moderately restricted. No  evidence of vegetation.  - Mitral valve: Mildly to moderately calcified annulus. There was a  small vegetation on the atrial aspect of the base of  the  posterior leaflet; the appearance is consistent with vegetation.  - Left atrium: No evidence of thrombus in the atrial cavity or  appendage.  - Atrial septum: No defect or patent foramen ovale was identified.  - Tricuspid valve: No evidence of vegetation.  - Pulmonic valve: No evidence of vegetation.   Lab Results      Component                Value               Date                      WBC                      6.4                 10/03/2019                HGB                      10.1 (L)            10/03/2019                HCT                      34.5 (L)            10/03/2019  MCV                      98.6                10/03/2019                PLT                      170                 10/03/2019            Lab Results      Component                Value               Date                      CREATININE               7.82 (H)            10/04/2019                BUN                      43 (H)              10/04/2019                NA                       136                 10/04/2019                K                        5.6 (H)             10/04/2019                CL                       97 (L)              10/04/2019                CO2                      25                  10/04/2019            Covid-19 Nucleic Acid Test Results Lab Results      Component                Value               Date                      SARSCOV2NAA              NEGATIVE            10/03/2019                SARSCOV2NAA              NEGATIVE  03/24/2019                [place]              NEGATIVE            03/20/2019                [place]              NEGATIVE            11/26/2018                SARSCOV2NAA              NOT DETECTED        06/12/2018                SARSCOV2                 NEGATIVE            11/26/2018           )       Anesthesia Quick Evaluation

## 2019-10-03 NOTE — Care Management Obs Status (Signed)
MEDICARE OBSERVATION STATUS NOTIFICATION   Patient Details  Name: [name] MRN: [id number] Date of Birth: January 15, 1965   Medicare Observation Status Notification Given:       Laurena Slimmer, RN 10/03/2019, 10:03 PM

## 2019-10-03 NOTE — ED Provider Notes (Signed)
[facility] EMERGENCY DEPARTMENT Provider Note   CSN: [id number] Arrival date & time: 10/03/19  0732     History Chief Complaint  Patient presents with  . Vascular Access Problem  . Wound Infection  . Shortness of Breath    [name] is a 54 y.o. male.  HPI    Patient with medical history of spina bifida, lleal conduit, end-stage renal disease on chronic hemodialysis Monday Wednesday Friday, heart murmur, kidney stones presents to the emergency department chief complaint unable to obtain access at his right femoral dialysis catheter.  Patient states he was at hemodialysis today and  nursing staff was unable to obtain access at his port for hemodialysis.   they sent him here for further evaluation.  Patient states he had his right femoral access placed 2 years ago but does not remember the vascular surgeon name, it was done in [place].  He explains when dialysis could not obtain access to his port he started to panic and  develop shortness of breath and chest pain.  He states he was scared because he does not want to have to get a new access if his current access is blocked.  He states he is feeling fine up until his hemodialysis treatment today.  He denies fevers, chills, shortness of breath, chest pain, abdominal pain, nausea or vomiting.  He is seen by Dr. Marylou Mccoy vascular for his dialysis port and is scheduled to see him later today.  Denies any alleviating factors.  Patient denies headache, fever, chills, shortness of breath, chest pain, abdominal pain, nausea, vomiting, diarrhea, pedal edema.  Past Medical History:  Diagnosis Date  . Anemia   . Anxiety   . Constipation   . End stage renal failure on dialysis [facility])    [place]; MWF, on HD since 1997, exhausted upper ext access, and possibly LE access; cath dependent in R groin as of 06/2018  . GERD (gastroesophageal reflux disease)   . Grand mal seizure ([place]) X 4   last 1998 (02/29/2016)  . Heart  murmur    no problems with per pt. - nephrologist and Dr. Coletta Memos  . History of blood transfusion 2000s   "I was having blood loss; never found out from where"  . History of kidney stones   . Hypertension    hx of - has not taken bp meds in over 2 years  since dialysis  . Hypothyroidism   . Insomnia   . Migraine    "due to my BP in my late 1990s; none since" (02/29/2016)  . Nonhealing surgical wound    of the left arteriovenous graft  . Pneumonia    x 2  . Spina bifida [facility])     Patient Active Problem List   Diagnosis Date Noted  . Ureteral stone with hydronephrosis 03/26/2019  . Malfunction of nephrostomy tube ([place])   . Nephrostomy complication ([place]) 59/16/3846  . Abnormal CT scan, gastrointestinal tract   . Flank pain 11/26/2018  . Nephrolithiasis 11/26/2018  . Hydronephrosis with renal and ureteral calculus obstruction 11/26/2018  . Left flank pain 11/26/2018  . ESRD on dialysis ([place]) 06/12/2018  . PAD (peripheral artery disease) ([place]) 12/22/2017  . Problem with dialysis access ([place]) 12/21/2017  . Anemia 12/21/2017  . Hypothyroidism 12/21/2017  . GERD (gastroesophageal reflux disease) 12/21/2017  . Anxiety 12/21/2017  . Left great toe amputee ([place]) 12/19/2017  . Endocarditis of mitral valve   . Osteomyelitis of toe of left foot ([place])   .  MSSA bacteremia 12/08/2017  . Fever   . Right flank pain   . ESRD (end stage renal disease) ([place]) 09/20/2017  . ESRD (end stage renal disease) on dialysis ([place]) 08/07/2017  . Nonhealing surgical wound 02/29/2016  . Clotted dialysis access ([place]) 01/14/2016  . Removal of staples 07/19/2012  . Complication of AV dialysis fistula 06/27/2012  . End stage renal disease ([place]) 06/27/2012    Past Surgical History:  Procedure Laterality Date  . AMPUTATION Left 12/11/2017   Procedure: Left Great Toe Amputation;  Surgeon: [name], MD;  Location: [facility];  Service: Orthopedics;  Laterality: Left;  . APPENDECTOMY    . ARTERIOVENOUS  GRAFT PLACEMENT Left 10/16/2012   left femoral goretex graft         Dr Donnetta Hutching  . AV FISTULA PLACEMENT Left 06/27/2012   Procedure: EXPLORATORY LEFT THY-GRAFT PSEUDO-ANEURYSM;  Surgeon: Conrad [place], MD;  Location: [facility];  Service: Vascular;  Laterality: Left;  Revision of left Arteriovenus gortex graft in thigh.  . COLONOSCOPY    . FLEXIBLE SIGMOIDOSCOPY N/A 12/03/2018   Procedure: FLEXIBLE SIGMOIDOSCOPY;  Surgeon: Yetta Flock, MD;  Location: [facility];  Service: Gastroenterology;  Laterality: N/A;  . I & D EXTREMITY Left 02/29/2016   Procedure: DEBRIDEMENT LEFT THIGH WOUND;  Surgeon: Waynetta Sandy, MD;  Location: [facility];  Service: Vascular;  Laterality: Left;  . ILEOSTOMY  1970s  . INCISION AND DRAINAGE Left 10/16/2012   Procedure: INCISION AND Debridement left thigh graft;  Surgeon: Rosetta Posner, MD;  Location: [facility];  Service: Vascular;  Laterality: Left;  . INSERTION OF DIALYSIS CATHETER    . INSERTION OF DIALYSIS CATHETER  01/14/2016   Procedure: INSERTION OF DIALYSIS CATHETER;  Surgeon: Waynetta Sandy, MD;  Location: [facility];  Service: Vascular;;  . INSERTION OF DIALYSIS CATHETER Right 08/07/2017   Procedure: INSERTION OF DIALYSIS CATHETER;  Surgeon: Waynetta Sandy, MD;  Location: [facility] [place];  Service: Vascular;  Laterality: Right;  . INSERTION OF DIALYSIS CATHETER Right 12/21/2017   Procedure: RIGHT FEMORAL DIALYSIS CATHETER EXCHANGE;  Surgeon: Marty Heck, MD;  Location: [facility];  Service: Vascular;  Laterality: Right;  . INSERTION OF ILIAC STENT Left 01/14/2016   Procedure: INSERTION OF ILIAC STENT;  Surgeon: Waynetta Sandy, MD;  Location: [facility] [facility];  Service: Vascular;  Laterality: Left;  . INTRAOPERATIVE ARTERIOGRAM Left 01/14/2016   Procedure: INTRA OPERATIVE ARTERIOGRAM;  Surgeon: Waynetta Sandy, MD;  Location: [place] [place];  Service: Vascular;  Laterality: Left;  . IR NEPHROSTOMY EXCHANGE LEFT  03/21/2019  . IR NEPHROSTOMY PLACEMENT  LEFT  11/28/2018  . LITHOTRIPSY  x 3   "& a laser treatment" (02/29/2016)  . NEPHROLITHOTOMY Left 03/26/2019   Procedure: NEPHROLITHOTOMY PERCUTANEOUS;  Surgeon: Alexis Frock, MD;  Location: [facility];  Service: Urology;  Laterality: Left;  2 HRS  . NEPHROSTOMY Bilateral 1968  . PATELLAR TENDON REPAIR Right 1990s   "big incision"  . PERIPHERAL VASCULAR CATHETERIZATION  01/14/2016   Procedure: A/V SHUNTOGRAM;  Surgeon: Waynetta Sandy, MD;  Location: [facility];  Service: Vascular;;  . REVISION OF ARTERIOVENOUS GORETEX GRAFT Left 10/16/2012   Procedure: REVISION OF LEFT FEMORAL LOOP ARTERIOVENOUS GORETEX GRAFT;  Surgeon: [place]etta Posner, MD;  Location: [facility];  Service: Vascular;  Laterality: Left;  . REVISION OF ARTERIOVENOUS GORETEX GRAFT Left 02/11/2015   Procedure: EXCISION OF SMALL SEGMENT OF EXPOSED LEFT THIGH NON FUNCTIONING  ARTERIOVENOUS GORETEX GRAFT;  Surgeon: Mal Misty, MD;  [facility];  Service: Vascular;  Laterality: Left;  . REVISION OF ARTERIOVENOUS GORETEX GRAFT Left 01/14/2016   Procedure: REVISION OF ARTERIOVENOUS GORETEX GRAFT;  Surgeon: Waynetta Sandy, MD;  Location: [facility];  Service: Vascular;  Laterality: Left;  . REVISION OF ARTERIOVENOUS GORETEX GRAFT Left 02/29/2016   thigh/pt report  . REVISION OF ARTERIOVENOUS GORETEX GRAFT Left 02/29/2016   Procedure: POSSIBLE REVISION OF LEFT THIGH ARTERIOVENOUS GORETEX GRAFT;  Surgeon: Waynetta Sandy, MD;  Location: [facility];  Service: Vascular;  Laterality: Left;  . TEE WITHOUT CARDIOVERSION N/A 12/14/2017   Procedure: TRANSESOPHAGEAL ECHOCARDIOGRAM (TEE);  Surgeon: Acie Fredrickson, Wonda Cheng, MD;  Location: [facility];  Service: Cardiovascular;  Laterality: N/A;  . THROMBECTOMY AND REVISION OF ARTERIOVENTOUS (AV) GORETEX  GRAFT Left 06/12/2018   Procedure: Removal of infected left thigh graft;  Surgeon: [place]etta Posner, MD;  Location: [facility];  Service: Vascular;  Laterality: Left;  . THROMBECTOMY W/ EMBOLECTOMY Left  01/14/2016   Procedure: THROMBECTOMY ARTERIOVENOUS GORE-TEX Left thigh GRAFT;  Surgeon: Waynetta Sandy, MD;  Location: [facility];  Service: Vascular;  Laterality: Left;  . TONGUE SURGERY  ~ 1990   tongue-tie release   . ULTRASOUND GUIDANCE FOR VASCULAR ACCESS Right 08/07/2017   Procedure: ULTRASOUND GUIDANCE FOR VASCULAR CANNULATION RIGHT FEMORAL VEIN AND LEFT AV FEMORAL GRAFT;  Surgeon: Waynetta Sandy, MD;  Location: [facility];  Service: Vascular;  Laterality: Right;  . UPPER EXTREMITY VENOGRAPHY Bilateral 08/09/2017   Procedure: UPPER EXTREMITY VENOGRAPHY;  Surgeon: Serafina Mitchell, MD;  Location: [facility];  Service: Cardiovascular;  Laterality: Bilateral;  . VENOGRAM N/A 09/20/2017   Procedure: RIGHT COMMON FEMORAL ARTERY EXPLORATION.  CANNULATION RIGHT COMMON FEMORAL VEIN. VENOGRAM CENTRAL ULTRA SOUND GUIDED RIGHT FEMORAL VEIN TIMES TWO.;  Surgeon: Waynetta Sandy, MD;  Location: [facility];  Service: Vascular;  Laterality: N/A;  . WOUND DEBRIDEMENT Left 02/29/2016   thigh       Family History  Problem Relation Age of Onset  . Diabetes Mother   . Hypertension Mother   . Heart disease Mother        before age 45  . Diabetes Father   . Heart attack Father        X's 3  . Diabetes Sister   . Bipolar disorder Sister     Social History   Tobacco Use  . Smoking status: Never Smoker  . Smokeless tobacco: Never Used  Vaping Use  . Vaping Use: Never used  Substance Use Topics  . Alcohol use: Not Currently    Alcohol/week: 0.0 standard drinks    Comment: 02/29/2016 "nothing since  the mid 1990s  . Drug use: Not Currently    Types: Marijuana    Comment: 02/29/2016 "nothing since ~ 1995"    Home Medications Prior to Admission medications   Medication Sig Start Date End Date Taking? Authorizing Provider  calcium acetate (PHOSLO) 667 MG capsule Take 3-4 capsules (2,001-2,668 mg total) by mouth See admin instructions. Take 2708 mg by mouth 3 times daily with  meals, take 2001 mg by mouth with snacks Patient taking differently: Take 2,001-2,668 mg by mouth See admin instructions. Take 2,668 mg by mouth three times a day with meals and 2,001 mg with optional snacks 01/14/18   Hennie Duos, MD  famotidine (PEPCID) 20 MG tablet Take 1 tablet (20 mg total) by mouth daily. 03/25/19   Gifford Shave, MD  fentaNYL (DURAGESIC) 50 MCG/HR Place 1 patch onto the skin every  3 (three) days. 02/25/19   [provider]  fluticasone (FLONASE) 50 MCG/ACT nasal spray Place 2 sprays into both nostrils as needed for allergies or rhinitis (congestion). Patient taking differently: Place 2 sprays into both nostrils as needed for allergies or rhinitis (or congestion).  01/14/18   Hennie Duos, MD  furosemide (LASIX) 80 MG tablet Take 80 mg by mouth 2 (two) times daily as needed (fluid).    [provider]  levothyroxine (SYNTHROID, LEVOTHROID) 125 MCG tablet Take 1 tablet (125 mcg total) by mouth daily before breakfast. 01/14/18   Hennie Duos, MD  lidocaine (LIDODERM) 5 % Place 2 patches onto the skin daily. 02/03/19   [provider]  LORazepam (ATIVAN) 1 MG tablet Take 1 mg by mouth daily as needed (anxiety).     [provider]  Nutritional Supplements (NOVASOURCE RENAL PO) Take 237 mLs by mouth daily.    [provider]  oxyCODONE-acetaminophen (PERCOCET) 10-325 MG tablet Take 1 tablet by mouth every 6 (six) hours as needed for pain. 06/14/18   Dagoberto Ligas, PA-C  promethazine (PHENERGAN) 25 MG tablet Take 1 tablet (25 mg total) by mouth 2 (two) times daily as needed for nausea or vomiting. Patient taking differently: Take 25 mg by mouth every 8 (eight) hours as needed for nausea or vomiting.  01/14/18   Hennie Duos, MD  sodium polystyrene (KAYEXALATE) powder Take 15 g by mouth See admin instructions. Take 15 g (mixed in water) by mouth on Saturdays and Sundays 01/14/18   Hennie Duos, MD  traZODone (DESYREL)  100 MG tablet Take 1 tablet (100 mg total) by mouth at bedtime. 01/14/18   Hennie Duos, MD  zolpidem (AMBIEN) 10 MG tablet Take 1 tablet (10 mg total) by mouth at bedtime. 01/14/18   Hennie Duos, MD    Allergies    Hydrocodone, Furadantin [nitrofurantoin], Mandelamine [methenamine], Noroxin [norfloxacin], Sulfa antibiotics, and Sulfur  Review of Systems   Review of Systems  Constitutional: Negative for chills and fever.  HENT: Negative for congestion, sore throat, tinnitus and trouble swallowing.   Eyes: Negative for visual disturbance.  Respiratory: Positive for shortness of breath. Negative for cough.   Cardiovascular: Positive for chest pain.  Gastrointestinal: Negative for abdominal pain, diarrhea, nausea and vomiting.  Genitourinary: Negative for enuresis, flank pain and frequency.  Musculoskeletal: Negative for back pain.  Skin: Negative for rash.  Neurological: Negative for dizziness, light-headedness and headaches.  Hematological: Does not bruise/bleed easily.    Physical Exam Updated Vital Signs BP (!) 142/111   Pulse 69   Temp 98.9 F (37.2 C) (Oral)   Resp (!) 21   Ht 5\' 11"  (1.803 m)   Wt 117 kg   SpO2 99%   BMI 35.98 kg/m   Physical Exam Vitals and nursing note reviewed.  Constitutional:      General: He is not in acute distress.    Appearance: He is not ill-appearing.  HENT:     Head: Normocephalic and atraumatic.     Nose: No congestion.     Mouth/Throat:     Mouth: Mucous membranes are moist.     Pharynx: Oropharynx is clear. No oropharyngeal exudate or posterior oropharyngeal erythema.  Eyes:     General: No scleral icterus. Cardiovascular:     Rate and Rhythm: Normal rate and regular rhythm.     Pulses: Normal pulses.     Heart sounds: No murmur heard.  No friction rub. No gallop.  Pulmonary:     Effort: No respiratory distress.     Breath sounds: No wheezing, rhonchi or rales.  Abdominal:     General: There is no distension.      Palpations: Abdomen is soft.     Tenderness: There is abdominal tenderness. There is no right CVA tenderness, left CVA tenderness or guarding.     Comments: Patient's abdomen was visualized, there was a ilia conduit noted in the right upper quadrant, site was visualized no signs of infection noted.  Abdomen was nondistended, normoactive bowel sounds, dull to percussion.  Slight left lower quadrant tenderness upon palpation, no acute abdomen noted on exam.  Musculoskeletal:        General: No swelling.  Skin:    General: Skin is warm and dry.     Capillary Refill: Capillary refill takes less than 2 seconds.     Findings: No rash.     Comments: Skin exam was performed there was a surgical wound noted on the patient's left lower flank.  Patient had previous nephrostomy tube removed, no erythema noted around the wound, slight drainage noted, no signs of infection noted, slight tender to palpation, no fluctuance or induration felt.  On the left mid anterior aspect of thigh there was a old graft noted, slight bulging mass noted, no erythema, no signs of infection, slight tenderness to palpation, no induration or fluctuance felt.  Patient's right femoral dialysis port was visualized no erythema or signs of infection noted around the insertion site.  Area was nontender to palpation, no induration or fluctuance felt.  Neurological:     Mental Status: He is alert.  Psychiatric:        Mood and Affect: Mood normal.     ED Results / Procedures / Treatments   Labs (all labs ordered are listed, but only abnormal results are displayed) Labs Reviewed  CBC - Abnormal; Notable for the following components:      Result Value   RBC 3.50 (*)    Hemoglobin 10.1 (*)    HCT 34.5 (*)    MCHC 29.3 (*)    RDW 17.7 (*)    All other components within normal limits  BASIC METABOLIC PANEL - Abnormal; Notable for the following components:   Potassium 5.5 (*)    Chloride 96 (*)    BUN 59 (*)    Creatinine, Ser  9.34 (*)    GFR calc non Af Amer 6 (*)    GFR calc Af Amer 7 (*)    Anion gap 16 (*)    All other components within normal limits  RESP PANEL BY RT PCR (RSV, FLU A&B, COVID)    EKG EKG Interpretation  Date/Time:  Friday October 03 2019 07:35:49 EDT Ventricular Rate:  64 PR Interval:  182 QRS Duration: 90 QT Interval:  424 QTC Calculation: 437 R Axis:   61 Text Interpretation: Normal sinus rhythm Normal ECG Confirmed by Madalyn Rob [id number]) on 10/03/2019 1:22:13 PM   Radiology DG Chest 2 View  Result Date: 10/03/2019 CLINICAL DATA:  Short of breath EXAM: CHEST - 2 VIEW COMPARISON:  12/07/2017 FINDINGS: The heart size and mediastinal contours are within normal limits. Both lungs are clear. The visualized skeletal structures are unremarkable. IMPRESSION: No active cardiopulmonary disease. Electronically Signed   By: Franchot Gallo M.D.   On: 10/03/2019 08:01    Procedures Procedures (including critical care time)  Medications Ordered in ED Medications  Chlorhexidine Gluconate Cloth 2 % PADS 6 each (has  no administration in time range)  doxercalciferol (HECTOROL) injection 3 mcg (has no administration in time range)  heparin sodium (porcine) 1000 UNIT/ML injection (has no administration in time range)  vancomycin (VANCOCIN) IVPB 1000 mg/200 mL premix (has no administration in time range)  oxyCODONE-acetaminophen (PERCOCET/ROXICET) 5-325 MG per tablet 1 tablet (1 tablet Oral Given 10/03/19 1320)  ondansetron (ZOFRAN-ODT) disintegrating tablet 4 mg (4 mg Oral Given 10/03/19 1321)    ED Course  I have reviewed the triage vital signs and the nursing notes.  Pertinent labs & imaging results that were available during my care of the patient were reviewed by me and considered in my medical decision making (see chart for details).    MDM Rules/Calculators/A&P                          Patient presents with difficulty with getting access to his dialysis catheter.  He was alert,  did not appear in acute distress, vital signs reassuring.  Will obtain screening labs, chest x-ray, EKG for further evaluation.  CBC negative for leukocytosis, has normocytic anemia at 10.1 which appears to be a baseline for patient.  BMP showing hypokalemia of 5.5, no signs of metabolic acidosis, creatinine 9.34 appears to be a baseline for patient, anion gap of 16.  Chest x-ray did not show any acute abnormalities.  EKG was sinus rhythm at signs of ischemia no signs of ST elevation or depression noted.  Will consult with vascular surgery for evaluation of patient's dialysis catheter.  Will also leak with dialysis coordinator to arrange dialysis for this patient.  Spoke with Terri Piedra who has arranged patient to receive his dialysis treatment tomorrow as well as arrange for transportation.    Spoke with Dr. Trula Slade of vascular who will come down and evaluate the patient. Dr. Trula Slade did not see any visible kinks or issues with patient's catheter.  He would like the patient to be admitted for inpatient dialysis.  He spoke with Dr. Royce Macadamia who will come and perform dialysis on this patient.  If there is any issues with the catheter Dr. Dessie Coma will bring patient to the OR to replace the catheter as well as address patient's left grasp.  Spoke with Dr. Delice Lesch with the hospitalist team he has agreed to accept the patient and will come and evaluate the patient.  I have low suspicion patient will require emergent hemodialysis as he was not in respiratory failure, no signs of fluid overload, no severe electrolyte abnormalities requiring immediate correction.  Patient had elevated potassium of 5.5 did not complain of chest pain and there are no EKG changes.  Low suspicion for ACS or CHF as patient denies chest pain, shortness of breath, no signs of hypoperfusion or fluid overload noted on exam, EKG was sinus rhythm out signs of ischemia.  I suspect patient initial complaint of chest pain shortness of breath was  secondary to anxiety as it all went away once the patient saw that his lab work and his catheter would be addressed.  Low suspicion for intra-abdominal abnormality requiring surgical intervention as patient denies abdominal pain, he is tolerating p.o., no acute abdomen noted on exam.    Patient was reassessed, states he is feeling much better, vital signs have remained stable.  Patient care will be transferred to hospitalist team for further evaluation management.   Final Clinical Impression(s) / ED Diagnoses Final diagnoses:  Hyperkalemia  Complication associated with dialysis catheter    Rx /  DC Orders ED Discharge Orders    None       Marcello Fennel, PA-C 10/03/19 1455    Lucrezia Starch, MD 10/06/19 1023

## 2019-10-03 NOTE — Progress Notes (Signed)
FPTS Interim Progress Note  Received page from nurse that patient was requesting home Trazodone, ambien, and percocet.   Per chart review, patient is prescribed each of these by PCP for chronic pain and insmonia, with last appointment on 9/28.  - Given these are chronic medications that go back as far as 2019, will restart.  - PDMP aware reviewed with no red flags - He requested to discontinue Fentanyl patches thus this should be removed from home meds. Per PDMP aware, last picked up on 04/03/19.  Per chart review, on 8/31 he had an elevated TSH of 6.3. His home Synthroid was increased from 137mcg to 155mcg. Will start new dose.   Danna Hefty, DO 10/03/2019, 8:49 PM PGY-3, [place] [facility] pager [phone]

## 2019-10-03 NOTE — H&P (Cosign Needed)
[facility] Admission History and Physical Service Pager: [phone]  Patient name: Ibrahim Mcpheeters Medical record number: [id number] Date of birth: 11-May-1965 Age: 54 y.o. Gender: male  Primary Care Provider: [name], MD Consultants: vascular surgery, nephrology Code Status: FULL Preferred Emergency Contact: Elizabeth Palau, sister, [phone]  Chief Complaint: Dialysis access issue  Assessment and Plan: Brach Birdsall is a 53 y.o. male presenting with inability to obtain access at his right femoral dialysis catheter while at HD. PMH is significant for ESRD on HD (MWF), PAD, kidney stones, spina bifida, nephrostomy tube, HTN, GERD, anxiety.  Dialysis access issue Patient with longstanding history of ESRD on HD MWF via catheter in his right groin, sent to the ED while at HD because they were unable to use the catheter for dialysis with concern that the catheter may be kinked.  No obvious kinks on external aspect of the catheter.  He has unfortunately exhausted options for access in his bilateral upper extremities and the left thigh and would require translumbar access if unable to use this catheter.  He also has a draining open wound in the left thigh from an old graft site.  Nephrology and vascular surgery following, appreciate involvement. - admit to FPTS, med-surg, Dr. Ardelia Mems attending - plan for re-attempting HD today per nephrology - if unsuccessful, plan for dialysis catheter exchange tomorrow per vascular surgery - AM renal function panel - renal diet, NPO at midnight for procedure - cardiac monitoring - activity ad lib - vitals per unit routine  ESRD  On HD MWF.  Longstanding history of ESRD, has been on dialysis for about 25 years.  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 - HD as above  Hyperkalemia Takes Kayexalate (powdered) on Saturdays  and Sundays. Mildly hyperkalemic 5.5 on admission.  EKG in NSR without any peaked T waves. - HD as above  GERD Reports having more chest pain recently, partially due to acid reflux.  States he has been eating and lying flat right after eating more.  Reports taking famotidine 20 mg daily. - awaiting med rec - consider switching to PPI if sx not controlled with H2 blocker  Dysphagia Patient reporting dysphagia over the past week, feeling food and medications stuck in his throat.  Able to drink liquids fine.  Also has been having some throat pain.  This could represent underlying esophageal stricture related to his GERD.  Posterior circulation CVA less likely given lack of focal neurological deficit.  Differential could also include obstructive neoplasm, [place] diverticulum.  No tonsillar enlargement on exam. - SLP eval - can consider MBS pending SLP eval results  Hypothyroidism  Home medications include 137 mcg Levothyroxine.  - awaiting med rec  Anxiety Takes 1 mg Ativan daily prn for anxiety.  Likely contributing to chest pain and SOB. - awaiting med rec  Chronic pain  Takes Perocet 10 mg QID prn. - awaiting med rec  Insomnia  Takes Ambien and Trazodone  at bedtime. Reports previous hx of night terrors related to starting dialysis which resolved with trazodone.  - awaiting med rec  Nausea  Takes promethazine in the morning. - awaiting med rec  FEN/GI: renal diet Prophylaxis: [place]  Disposition: Admit to med-surg  History of Present Illness:  Kirklin Mcduffee is a 54 y.o. male presenting with problems with his dialysis catheter.  Patient went to dialysis today and they couldn't get it to run. They clamped it  in the same place.  Reports a RN at his dialysis center forgot to clamp it and blood shot out. Port-a-cath placed in [place] at the dialysis institute. Patient has been on dialysis for about 25 years. Pt was born with neurogenic bladder. Ambulates without assistance. Hx of  recurrent kidney stones requiring nephrostomy tubes.  Patient reports sharp chest pain and shortness of breath for the past week. Endorses orthopnea and has been sleeping more elevated in his hospital bed, though he states sx are not similar to when he is "volume overloaded."  Takes 160 mg Lasix. Produces small amounts of urine. Has been lightheaded and blurry vision with his dialysis treatment on Monday and Wednesday but resolved when he took his hat off.  Denies fever, chills. Nonexertional chest pain. Hurts to breathe in deep.    Patient is fully vaccinated against COVID.  Patient states sometimes pills get caught on the right side of his throat. Able to eat and drink fluids. Sx worse when he eats a lot of pepper. Reports he eats laying down. Has hx of GERD.   In the ED, CXR was unremarkable.   Review Of Systems: Per HPI with the following additions:  Review of Systems  Constitutional: Negative for chills and fever.  HENT: Negative for sore throat.   Eyes:       Wears glasses   Respiratory: Positive for cough and shortness of breath.   Cardiovascular: Positive for chest pain.  Gastrointestinal: Positive for abdominal pain (left).  Genitourinary: Positive for flank pain (right).  Musculoskeletal: Positive for joint pain.  Skin: Negative for rash.  Neurological: Negative for headaches.  Psychiatric/Behavioral: The patient has insomnia.   All other systems reviewed and are negative.   Patient Active Problem List   Diagnosis Date Noted  . Ureteral stone with hydronephrosis 03/26/2019  . Malfunction of nephrostomy tube ([place])   . Nephrostomy complication ([place]) 41/66/0630  . Abnormal CT scan, gastrointestinal tract   . Flank pain 11/26/2018  . Nephrolithiasis 11/26/2018  . Hydronephrosis with renal and ureteral calculus obstruction 11/26/2018  . Left flank pain 11/26/2018  . ESRD on dialysis ([place]) 06/12/2018  . PAD (peripheral artery disease) ([place]) 12/22/2017  . Problem with  dialysis access ([place]) 12/21/2017  . Anemia 12/21/2017  . Hypothyroidism 12/21/2017  . GERD (gastroesophageal reflux disease) 12/21/2017  . Anxiety 12/21/2017  . Left great toe amputee ([place]) 12/19/2017  . Endocarditis of mitral valve   . 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 MSSA bacteremia 12/08/2017  . Fever   . Right flank pain   . ESRD (end stage renal disease) ([place]) 09/20/2017  . ESRD (end stage renal disease) on dialysis ([place]) 08/07/2017  . Nonhealing surgical wound 02/29/2016  . Clotted dialysis access ([place]) 01/14/2016  . Removal of staples 07/19/2012  . Complication of AV dialysis fistula 06/27/2012  . End stage renal disease ([place]) 06/27/2012    Past Medical History: Past Medical History:  Diagnosis Date  . Anemia   . Anxiety   . Constipation   . End stage renal failure on dialysis [facility])    [place]; MWF, on HD since 1997, exhausted upper ext access, and possibly LE access; cath dependent in R groin as of 06/2018  . GERD (gastroesophageal reflux disease)   . Grand mal seizure ([place]) X 4   last 1998 (02/29/2016)  . Heart murmur    no problems with per pt. - nephrologist and Dr. Coletta Memos  . History of blood transfusion 2000s   "  I was having blood loss; never found out from where"  . History of kidney stones   . Hypertension    hx of - has not taken bp meds in over 2 years  since dialysis  . Hypothyroidism   . Insomnia   . Migraine    "due to my BP in my late 1990s; none since" (02/29/2016)  . Nonhealing surgical wound    of the left arteriovenous graft  . Pneumonia    x 2  . Spina bifida [facility])     Past Surgical History: Past Surgical History:  Procedure Laterality Date  . AMPUTATION Left 12/11/2017   Procedure: Left Great Toe Amputation;  Surgeon: [name], MD;  Location: [place];  Service: Orthopedics;  Laterality: Left;  . APPENDECTOMY    . ARTERIOVENOUS GRAFT PLACEMENT Left 10/16/2012   left femoral goretex graft         Dr Donnetta Hutching  . AV FISTULA  PLACEMENT Left 06/27/2012   Procedure: EXPLORATORY LEFT THY-GRAFT PSEUDO-ANEURYSM;  Surgeon: Conrad Friesland, MD;  Location: [place];  Service: Vascular;  Laterality: Left;  Revision of left Arteriovenus gortex graft in thigh.  . COLONOSCOPY    . FLEXIBLE SIGMOIDOSCOPY N/A 12/03/2018   Procedure: FLEXIBLE SIGMOIDOSCOPY;  Surgeon: Yetta Flock, MD;  Location: [place];  Service: Gastroenterology;  Laterality: N/A;  . I & D EXTREMITY Left 02/29/2016   Procedure: DEBRIDEMENT LEFT THIGH WOUND;  Surgeon: Waynetta Sandy, MD;  Location: [place];  Service: Vascular;  Laterality: Left;  . ILEOSTOMY  1970s  . INCISION AND DRAINAGE Left 10/16/2012   Procedure: INCISION AND Debridement left thigh graft;  Surgeon: Rosetta Posner, MD;  Location: [place];  Service: Vascular;  Laterality: Left;  . INSERTION OF DIALYSIS CATHETER    . INSERTION OF DIALYSIS CATHETER  01/14/2016   Procedure: INSERTION OF DIALYSIS CATHETER;  Surgeon: Waynetta Sandy, MD;  Location: [place];  Service: Vascular;;  . INSERTION OF DIALYSIS CATHETER Right 08/07/2017   Procedure: INSERTION OF DIALYSIS CATHETER;  Surgeon: Waynetta Sandy, MD;  Location: [place];  Service: Vascular;  Laterality: Right;  . INSERTION OF DIALYSIS CATHETER Right 12/21/2017   Procedure: RIGHT FEMORAL DIALYSIS CATHETER EXCHANGE;  Surgeon: Marty Heck, MD;  Location: [place];  Service: Vascular;  Laterality: Right;  . INSERTION OF ILIAC STENT Left 01/14/2016   Procedure: INSERTION OF ILIAC STENT;  Surgeon: Waynetta Sandy, MD;  Location: [place];  Service: Vascular;  Laterality: Left;  . INTRAOPERATIVE ARTERIOGRAM Left 01/14/2016   Procedure: INTRA OPERATIVE ARTERIOGRAM;  Surgeon: Waynetta Sandy, MD;  Location: [place];  Service: Vascular;  Laterality: Left;  . IR NEPHROSTOMY EXCHANGE LEFT  03/21/2019  . IR NEPHROSTOMY PLACEMENT LEFT  11/28/2018  . LITHOTRIPSY  x 3   "& a laser treatment" (02/29/2016)  . NEPHROLITHOTOMY  Left 03/26/2019   Procedure: NEPHROLITHOTOMY PERCUTANEOUS;  Surgeon: Alexis Frock, MD;  Location: [facility];  Service: Urology;  Laterality: Left;  2 HRS  . NEPHROSTOMY Bilateral 1968  . PATELLAR TENDON REPAIR Right 1990s   "big incision"  . PERIPHERAL VASCULAR CATHETERIZATION  01/14/2016   Procedure: A/V SHUNTOGRAM;  Surgeon: Waynetta Sandy, MD;  Location: [place];  Service: Vascular;;  . REVISION OF ARTERIOVENOUS GORETEX GRAFT Left 10/16/2012   Procedure: REVISION OF LEFT FEMORAL LOOP ARTERIOVENOUS GORETEX GRAFT;  Surgeon: Rosetta Posner, MD;  Location: [place];  Service: Vascular;  Laterality: Left;  . REVISION OF ARTERIOVENOUS GORETEX GRAFT Left  02/11/2015   Procedure: EXCISION OF SMALL SEGMENT OF EXPOSED LEFT THIGH NON FUNCTIONING  ARTERIOVENOUS GORETEX GRAFT;  Surgeon: Mal Misty, MD;  Location: [place];  Service: Vascular;  Laterality: Left;  . REVISION OF ARTERIOVENOUS GORETEX GRAFT Left 01/14/2016   Procedure: REVISION OF ARTERIOVENOUS GORETEX GRAFT;  Surgeon: Waynetta Sandy, MD;  Location: [place];  Service: Vascular;  Laterality: Left;  . REVISION OF ARTERIOVENOUS GORETEX GRAFT Left 02/29/2016   thigh/pt report  . REVISION OF ARTERIOVENOUS GORETEX GRAFT Left 02/29/2016   Procedure: POSSIBLE REVISION OF LEFT THIGH ARTERIOVENOUS GORETEX GRAFT;  Surgeon: Waynetta Sandy, MD;  Location: [place];  Service: Vascular;  Laterality: Left;  . TEE WITHOUT CARDIOVERSION N/A 12/14/2017   Procedure: TRANSESOPHAGEAL ECHOCARDIOGRAM (TEE);  Surgeon: Acie Fredrickson, Wonda Cheng, MD;  Location: [place];  Service: Cardiovascular;  Laterality: N/A;  . THROMBECTOMY AND REVISION OF ARTERIOVENTOUS (AV) GORETEX  GRAFT Left 06/12/2018   Procedure: Removal of infected left thigh graft;  Surgeon: Rosetta Posner, MD;  Location: [place];  Service: Vascular;  Laterality: Left;  . THROMBECTOMY W/ EMBOLECTOMY Left 01/14/2016   Procedure: THROMBECTOMY ARTERIOVENOUS GORE-TEX Left thigh GRAFT;  Surgeon: Waynetta Sandy, MD;  Location: [place];  Service: Vascular;  Laterality: Left;  . TONGUE SURGERY  ~ 1990   tongue-tie release   . ULTRASOUND GUIDANCE FOR VASCULAR ACCESS Right 08/07/2017   Procedure: ULTRASOUND GUIDANCE FOR VASCULAR CANNULATION RIGHT FEMORAL VEIN AND LEFT AV FEMORAL GRAFT;  Surgeon: Waynetta Sandy, MD;  Location: [place];  Service: Vascular;  Laterality: Right;  . UPPER EXTREMITY VENOGRAPHY Bilateral 08/09/2017   Procedure: UPPER EXTREMITY VENOGRAPHY;  Surgeon: Serafina Mitchell, MD;  Location: [place] [facility];  Service: Cardiovascular;  Laterality: Bilateral;  . VENOGRAM N/A 09/20/2017   Procedure: RIGHT COMMON FEMORAL ARTERY EXPLORATION.  CANNULATION RIGHT COMMON FEMORAL VEIN. VENOGRAM CENTRAL ULTRA SOUND GUIDED RIGHT FEMORAL VEIN TIMES TWO.;  Surgeon: Waynetta Sandy, MD;  Location: [place];  Service: Vascular;  Laterality: N/A;  . WOUND DEBRIDEMENT Left 02/29/2016   thigh    Social History: Social History   Tobacco Use  . Smoking status: Never Smoker  . Smokeless tobacco: Never Used  Vaping Use  . Vaping Use: Never used  Substance Use Topics  . Alcohol use: Not Currently    Alcohol/week: 0.0 standard drinks    Comment: 02/29/2016 "nothing since  the mid 1990s  . Drug use: Not Currently    Types: Marijuana    Comment: 02/29/2016 "nothing since ~ 1995"   Additional social history: No THC use in last 5 years.  Please also refer to relevant sections of EMR.  Family History: Family History  Problem Relation Age of Onset  . Diabetes Mother   . Hypertension Mother   . Heart disease Mother        before age 43  . Diabetes Father   . Heart attack Father        X's 3  . Diabetes Sister   . Bipolar disorder Sister     Allergies and Medications: Allergies  Allergen Reactions  . Hydrocodone Other (See Comments)    Caused involuntary movement and twitching. CANNOT TAKE DUE TO MUSCLE SPASMS AND MUSCLE TREMORS  . Furadantin [Nitrofurantoin] Other  (See Comments)    UNSPECIFIED REACTION   . Mandelamine [Methenamine] Other (See Comments)    UNSPECIFIED REACTION   . Noroxin [Norfloxacin] Other (See Comments)    UNSPECIFIED REACTION   . Sulfa Antibiotics Other (See  Comments) and Cough    Childhood reaction - pt could not confirm that it was a cough  . Sulfur Cough    Childhood reaction - pt could not confirm that it was a cough   No current facility-administered medications on file prior to encounter.   Current Outpatient Medications on File Prior to Encounter  Medication Sig Dispense Refill  . calcium acetate (PHOSLO) 667 MG capsule Take 3-4 capsules (2,001-2,668 mg total) by mouth See admin instructions. Take 2708 mg by mouth 3 times daily with meals, take 2001 mg by mouth with snacks (Patient taking differently: Take 2,001-2,668 mg by mouth See admin instructions. Take 2,668 mg by mouth three times a day with meals and 2,001 mg with optional snacks) 450 capsule 0  . famotidine (PEPCID) 20 MG tablet Take 1 tablet (20 mg total) by mouth daily. 30 tablet 0  . fentaNYL (DURAGESIC) 50 MCG/HR Place 1 patch onto the skin every 3 (three) days.    . fluticasone (FLONASE) 50 MCG/ACT nasal spray Place 2 sprays into both nostrils as needed for allergies or rhinitis (congestion). (Patient taking differently: Place 2 sprays into both nostrils as needed for allergies or rhinitis (or congestion). ) 0.003 g 0  . furosemide (LASIX) 80 MG tablet Take 80 mg by mouth 2 (two) times daily as needed (fluid).    [place] Kitchen levothyroxine (SYNTHROID, LEVOTHROID) 125 MCG tablet Take 1 tablet (125 mcg total) by mouth daily before breakfast. 30 tablet 0  . lidocaine (LIDODERM) 5 % Place 2 patches onto the skin daily.    [place] Kitchen LORazepam (ATIVAN) 1 MG tablet Take 1 mg by mouth daily as needed (anxiety).     . Nutritional Supplements (NOVASOURCE RENAL PO) Take 237 mLs by mouth daily.    [place] Kitchen oxyCODONE-acetaminophen (PERCOCET) 10-325 MG tablet Take 1 tablet by mouth every 6 (six) hours  as needed for pain. 10 tablet 0  . promethazine (PHENERGAN) 25 MG tablet Take 1 tablet (25 mg total) by mouth 2 (two) times daily as needed for nausea or vomiting. (Patient taking differently: Take 25 mg by mouth every 8 (eight) hours as needed for nausea or vomiting. ) 30 tablet 0  . sodium polystyrene (KAYEXALATE) powder Take 15 g by mouth See admin instructions. Take 15 g (mixed in water) by mouth on Saturdays and Sundays 135 g 0  . traZODone (DESYREL) 100 MG tablet Take 1 tablet (100 mg total) by mouth at bedtime. 30 tablet 0  . zolpidem (AMBIEN) 10 MG tablet Take 1 tablet (10 mg total) by mouth at bedtime. 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 Derm: Port catheter on right thigh without swelling, erythema, or drainage and without obvious kinking; well-healed scars at access sites bilateral upper extremities; left back with percutaneous tube wound; dry, scaly skin prominent on back and arms; left thigh: see image below Neuro: Alert, interactive, CN II-XII intact Psych: Affect appropriate  Left thigh    Labs and Imaging: CBC BMET  Recent Labs  Lab 10/03/19 0742  WBC 6.4  HGB 10.1*  HCT 34.5*  PLT 170   Recent Labs  Lab 10/03/19 0742  NA 138  K 5.5*  CL 96*  CO2 26  BUN 59*  CREATININE 9.34*  GLUCOSE 90  CALCIUM 9.2     EKG: NSR  DG Chest 2 View  Result Date: 10/03/2019 CLINICAL DATA:  Short of breath EXAM: CHEST - 2 VIEW  COMPARISON:  12/07/2017 FINDINGS: The heart size and mediastinal contours are within normal limits. Both lungs are clear. The visualized skeletal structures are unremarkable. IMPRESSION: No active cardiopulmonary disease. Electronically Signed   By: Franchot Gallo M.D.   On: 10/03/2019 08:01     Zola Button, MD 10/03/2019, 2:10 PM PGY-1, [place] Intern pager: [phone], text pages welcome

## 2019-10-03 NOTE — H&P (View-Only) (Signed)
Vascular and Vein Specialist of [facility]  Patient name: [name] MRN: [id number] DOB: November 22, 1965 Sex: male   REQUESTING PROVIDER:    ED   REASON FOR CONSULT:    ESRD  HISTORY OF PRESENT ILLNESS:   [name] is a 54 y.o. male, who is very well-known to our practice for his dialysis access.  He has exhausted options in bilateral upper extremities and the left thigh.  He has been receiving treatments through a catheter in his right groin.  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  He has been using this for several years.  It was exchanged by Dr. Carlis Abbott in 2019.  This was a very difficult exchange.  Subsequent to that he had a new catheter placed in [place].  Today they were unable to use the catheter for dialysis and were concerned that it may be kinked and so he was sent to the ER.  The patient states that prior to today he has not had any issues with this catheter  The patient also has a chronic wound from a retained graft in his left thigh.  PAST MEDICAL HISTORY    Past Medical History:  Diagnosis Date  . Anemia   . Anxiety   . Constipation   . End stage renal failure on dialysis [facility])    [place]; MWF, on HD since 1997, exhausted upper ext access, and possibly LE access; cath dependent in R groin as of 06/2018  . GERD (gastroesophageal reflux disease)   . Grand mal seizure ([place]) X 4   last 1998 (02/29/2016)  . Heart murmur    no problems with per pt. - nephrologist and Dr. Coletta Memos  . History of blood transfusion 2000s   "I was having blood loss; never found out from where"  . History of kidney stones   . Hypertension    hx of - has not taken bp meds in over 2 years  since dialysis  . Hypothyroidism   . Insomnia   . Migraine    "due to my BP in my late 1990s; none since" (02/29/2016)  . Nonhealing surgical wound    of the left  arteriovenous graft  . Pneumonia    x 2  . Spina bifida ([place])      FAMILY HISTORY   Family History  Problem Relation Age of Onset  . Diabetes Mother   . Hypertension Mother   . Heart disease Mother        before age 26  . Diabetes Father   . Heart attack Father        X's 3  . Diabetes Sister   . Bipolar disorder Sister     SOCIAL HISTORY:   Social History   Socioeconomic History  . Marital status: Single    Spouse name: Not on file  . Number of children: 0  . Years of education: Not on file  . Highest education level: Not on file  Occupational History  . Occupation: Disabled  Tobacco Use  . Smoking status: Never Smoker  . Smokeless tobacco: Never Used  Vaping Use  . Vaping Use: Never used  Substance and Sexual Activity  . Alcohol use: Not Currently    Alcohol/week: 0.0 standard drinks    Comment: 02/29/2016 "nothing since  the mid 1990s  . Drug use: Not Currently    Types: Marijuana    Comment: 02/29/2016 "  nothing since ~ 1995"  . Sexual activity: Yes  Other Topics Concern  . Not on file  Social History Narrative  . Not on file   Social Determinants of Health   Financial Resource Strain:   . Difficulty of Paying Living Expenses: Not on file  Food Insecurity:   . Worried About Charity fundraiser in the Last Year: Not on file  . Ran Out of Food in the Last Year: Not on file  Transportation Needs:   . Lack of Transportation (Medical): Not on file  . Lack of Transportation (Non-Medical): Not on file  Physical Activity:   . Days of Exercise per Week: Not on file  . Minutes of Exercise per Session: Not on file  Stress:   . Feeling of Stress : Not on file  Social Connections:   . Frequency of Communication with Friends and Family: Not on file  . Frequency of Social Gatherings with Friends and Family: Not on file  . Attends Religious Services: Not on file  . Active Member of Clubs or Organizations: Not on file  . Attends Archivist Meetings:  Not on file  . Marital Status: Not on file  Intimate Partner Violence:   . Fear of Current or Ex-Partner: Not on file  . Emotionally Abused: Not on file  . Physically Abused: Not on file  . Sexually Abused: Not on file    ALLERGIES:    Allergies  Allergen Reactions  . Hydrocodone Other (See Comments)    Caused involuntary movement and twitching. CANNOT TAKE DUE TO MUSCLE SPASMS AND MUSCLE TREMORS  . Furadantin [Nitrofurantoin] Other (See Comments)    UNSPECIFIED REACTION   . Mandelamine [Methenamine] Other (See Comments)    UNSPECIFIED REACTION   . Noroxin [Norfloxacin] Other (See Comments)    UNSPECIFIED REACTION   . Sulfa Antibiotics Other (See Comments) and Cough    Childhood reaction - pt could not confirm that it was a cough  . Sulfur Cough    Childhood reaction - pt could not confirm that it was a cough    CURRENT MEDICATIONS:    Current Facility-Administered Medications  Medication Dose Route Frequency Provider Last Rate Last Admin  . [START ON 10/04/2019] Chlorhexidine Gluconate Cloth 2 % PADS 6 each  6 each Topical Q0600 Collins, Hervey Ard, PA-C      . [START ON 10/06/2019] doxercalciferol (HECTOROL) injection 3 mcg  3 mcg Intravenous Q M,W,F-HD Collins, Hervey Ard, PA-C       Current Outpatient Medications  Medication Sig Dispense Refill  . calcium acetate (PHOSLO) 667 MG capsule Take 3-4 capsules (2,001-2,668 mg total) by mouth See admin instructions. Take 2708 mg by mouth 3 times daily with meals, take 2001 mg by mouth with snacks (Patient taking differently: Take 2,001-2,668 mg by mouth See admin instructions. Take 2,668 mg by mouth three times a day with meals and 2,001 mg with optional snacks) 450 capsule 0  . famotidine (PEPCID) 20 MG tablet Take 1 tablet (20 mg total) by mouth daily. 30 tablet 0  . fentaNYL (DURAGESIC) 50 MCG/HR Place 1 patch onto the skin every 3 (three) days.    . fluticasone (FLONASE) 50 MCG/ACT nasal spray Place 2 sprays into both nostrils  as needed for allergies or rhinitis (congestion). (Patient taking differently: Place 2 sprays into both nostrils as needed for allergies or rhinitis (or congestion). ) 0.003 g 0  . furosemide (LASIX) 80 MG tablet Take 80 mg by mouth 2 (two) times  daily as needed (fluid).    Marland Kitchen levothyroxine (SYNTHROID, LEVOTHROID) 125 MCG tablet Take 1 tablet (125 mcg total) by mouth daily before breakfast. 30 tablet 0  . lidocaine (LIDODERM) 5 % Place 2 patches onto the skin daily.    Marland Kitchen LORazepam (ATIVAN) 1 MG tablet Take 1 mg by mouth daily as needed (anxiety).     . Nutritional Supplements (NOVASOURCE RENAL PO) Take 237 mLs by mouth daily.    Marland Kitchen oxyCODONE-acetaminophen (PERCOCET) 10-325 MG tablet Take 1 tablet by mouth every 6 (six) hours as needed for pain. 10 tablet 0  . promethazine (PHENERGAN) 25 MG tablet Take 1 tablet (25 mg total) by mouth 2 (two) times daily as needed for nausea or vomiting. (Patient taking differently: Take 25 mg by mouth every 8 (eight) hours as needed for nausea or vomiting. ) 30 tablet 0  . sodium polystyrene (KAYEXALATE) powder Take 15 g by mouth See admin instructions. Take 15 g (mixed in water) by mouth on Saturdays and Sundays 135 g 0  . traZODone (DESYREL) 100 MG tablet Take 1 tablet (100 mg total) by mouth at bedtime. 30 tablet 0  . zolpidem (AMBIEN) 10 MG tablet Take 1 tablet (10 mg total) by mouth at bedtime. 7 tablet 0    REVIEW OF SYSTEMS:   [X]  denotes positive finding, [ ]  denotes negative finding Cardiac  Comments:  Chest pain or chest pressure:    Shortness of breath upon exertion:    Short of breath when lying flat:    Irregular heart rhythm:        Vascular    Pain in calf, thigh, or hip brought on by ambulation:    Pain in feet at night that wakes you up from your sleep:     Blood clot in your veins:    Leg swelling:         Pulmonary    Oxygen at home:    Productive cough:     Wheezing:         Neurologic    Sudden weakness in arms or legs:     Sudden  numbness in arms or legs:     Sudden onset of difficulty speaking or slurred speech:    Temporary loss of vision in one eye:     Problems with dizziness:         Gastrointestinal    Blood in stool:      Vomited blood:         Genitourinary    Burning when urinating:     Blood in urine:        Psychiatric    Major depression:         Hematologic    Bleeding problems:    Problems with blood clotting too easily:        Skin    Rashes or ulcers:        Constitutional    Fever or chills:     PHYSICAL EXAM:   Vitals:   10/03/19 1115 10/03/19 1130 10/03/19 1200 10/03/19 1300  BP: 123/63 114/73 118/68 (!) 142/111  Pulse: 69 67 68 69  Resp: (!) 23 15 17  (!) 21  Temp:      TempSrc:      SpO2: 99% 99% 100% 99%  Weight:      Height:        GENERAL: The patient is a well-nourished male, in no acute distress. The vital signs are documented above. CARDIAC: There is a  regular rate and rhythm.  VASCULAR: I inspected the right thigh catheter.  I do not see any kinks on the external aspect of the catheter which was the area of concern. PULMONARY: Nonlabored respirations ABDOMEN: Soft and non-tender with normal pitched bowel sounds.  MUSCULOSKELETAL: There are no major deformities or cyanosis. NEUROLOGIC: No focal weakness or paresthesias are detected. SKIN: There is a chronic wound in the left medial thigh with residual underlying dialysis graft PSYCHIATRIC: The patient has a normal affect.  STUDIES:   none  ASSESSMENT and PLAN   This is a very difficult situation given the patient's limited options for access.  He is aware that when we are unable to use the catheter in the right groin that he will require translumbar access.  Since I do not see any obvious kinks on the external aspect of the catheter, which was the area of concern, I have recommended trying to dialyze him here in the hospital.  If this is successful, I would not try to exchange the catheter given the issues we  have encountered with exchanging this in the past.  If they are unable to dialyze him today, I will plan on dialysis catheter exchange in the morning.  In addition, he has a draining open wound in the left thigh from an old graft site.  I will plan on excising the graft and exploring this wound in the operating room tomorrow.  He needs to be n.p.o. after midnight   Leia Alf, MD, FACS Vascular and Vein Specialists of [facility] [phone] Pager [phone]

## 2019-10-04 ENCOUNTER — Observation Stay (HOSPITAL_COMMUNITY): Payer: Medicare Other | Admitting: Anesthesiology

## 2019-10-04 ENCOUNTER — Encounter (HOSPITAL_COMMUNITY)
Admission: EM | Disposition: A | Discharge status: Home Health | Payer: Self-pay | Source: Ambulatory Visit | Attending: Family Medicine

## 2019-10-04 ENCOUNTER — Other Ambulatory Visit: Payer: Self-pay

## 2019-10-04 DIAGNOSIS — Z833 Family history of diabetes mellitus: Secondary | ICD-10-CM | POA: Diagnosis not present

## 2019-10-04 DIAGNOSIS — Z20822 Contact with and (suspected) exposure to covid-19: Secondary | ICD-10-CM | POA: Diagnosis not present

## 2019-10-04 DIAGNOSIS — K219 Gastro-esophageal reflux disease without esophagitis: Secondary | ICD-10-CM | POA: Diagnosis not present

## 2019-10-04 DIAGNOSIS — Z79899 Other long term (current) drug therapy: Secondary | ICD-10-CM | POA: Diagnosis not present

## 2019-10-04 DIAGNOSIS — I120 Hypertensive chronic kidney disease with stage 5 chronic kidney disease or end stage renal disease: Secondary | ICD-10-CM | POA: Diagnosis not present

## 2019-10-04 DIAGNOSIS — T148XXA Other injury of unspecified body region, initial encounter: Secondary | ICD-10-CM | POA: Diagnosis not present

## 2019-10-04 DIAGNOSIS — I739 Peripheral vascular disease, unspecified: Secondary | ICD-10-CM | POA: Diagnosis present

## 2019-10-04 DIAGNOSIS — I2510 Atherosclerotic heart disease of native coronary artery without angina pectoris: Secondary | ICD-10-CM | POA: Diagnosis not present

## 2019-10-04 DIAGNOSIS — Z89412 Acquired absence of left great toe: Secondary | ICD-10-CM | POA: Diagnosis not present

## 2019-10-04 DIAGNOSIS — Y732 Prosthetic and other implants, materials and accessory gastroenterology and urology devices associated with adverse incidents: Secondary | ICD-10-CM | POA: Diagnosis present

## 2019-10-04 DIAGNOSIS — T82898A Other specified complication of vascular prosthetic devices, implants and grafts, initial encounter: Secondary | ICD-10-CM | POA: Diagnosis not present

## 2019-10-04 DIAGNOSIS — R110 Nausea: Secondary | ICD-10-CM | POA: Diagnosis present

## 2019-10-04 DIAGNOSIS — N319 Neuromuscular dysfunction of bladder, unspecified: Secondary | ICD-10-CM | POA: Diagnosis present

## 2019-10-04 DIAGNOSIS — E875 Hyperkalemia: Secondary | ICD-10-CM | POA: Diagnosis not present

## 2019-10-04 DIAGNOSIS — R011 Cardiac murmur, unspecified: Secondary | ICD-10-CM | POA: Diagnosis not present

## 2019-10-04 DIAGNOSIS — R072 Precordial pain: Secondary | ICD-10-CM | POA: Diagnosis not present

## 2019-10-04 DIAGNOSIS — M009 Pyogenic arthritis, unspecified: Secondary | ICD-10-CM | POA: Diagnosis not present

## 2019-10-04 DIAGNOSIS — I350 Nonrheumatic aortic (valve) stenosis: Secondary | ICD-10-CM | POA: Diagnosis not present

## 2019-10-04 DIAGNOSIS — G8929 Other chronic pain: Secondary | ICD-10-CM | POA: Diagnosis present

## 2019-10-04 DIAGNOSIS — N186 End stage renal disease: Secondary | ICD-10-CM | POA: Diagnosis not present

## 2019-10-04 DIAGNOSIS — Y838 Other surgical procedures as the cause of abnormal reaction of the patient, or of later complication, without mention of misadventure at the time of the procedure: Secondary | ICD-10-CM | POA: Diagnosis present

## 2019-10-04 DIAGNOSIS — Z8249 Family history of ischemic heart disease and other diseases of the circulatory system: Secondary | ICD-10-CM | POA: Diagnosis not present

## 2019-10-04 DIAGNOSIS — R0789 Other chest pain: Secondary | ICD-10-CM | POA: Diagnosis not present

## 2019-10-04 DIAGNOSIS — G4700 Insomnia, unspecified: Secondary | ICD-10-CM | POA: Diagnosis present

## 2019-10-04 DIAGNOSIS — Y832 Surgical operation with anastomosis, bypass or graft as the cause of abnormal reaction of the patient, or of later complication, without mention of misadventure at the time of the procedure: Secondary | ICD-10-CM | POA: Diagnosis present

## 2019-10-04 DIAGNOSIS — T829XXA Unspecified complication of cardiac and vascular prosthetic device, implant and graft, initial encounter: Secondary | ICD-10-CM | POA: Diagnosis not present

## 2019-10-04 DIAGNOSIS — E039 Hypothyroidism, unspecified: Secondary | ICD-10-CM | POA: Diagnosis present

## 2019-10-04 DIAGNOSIS — R1310 Dysphagia, unspecified: Secondary | ICD-10-CM | POA: Diagnosis not present

## 2019-10-04 DIAGNOSIS — F419 Anxiety disorder, unspecified: Secondary | ICD-10-CM | POA: Diagnosis present

## 2019-10-04 DIAGNOSIS — L089 Local infection of the skin and subcutaneous tissue, unspecified: Secondary | ICD-10-CM | POA: Diagnosis not present

## 2019-10-04 DIAGNOSIS — T827XXD Infection and inflammatory reaction due to other cardiac and vascular devices, implants and grafts, subsequent encounter: Secondary | ICD-10-CM | POA: Diagnosis not present

## 2019-10-04 DIAGNOSIS — R197 Diarrhea, unspecified: Secondary | ICD-10-CM | POA: Diagnosis not present

## 2019-10-04 DIAGNOSIS — I200 Unstable angina: Secondary | ICD-10-CM | POA: Diagnosis not present

## 2019-10-04 DIAGNOSIS — A419 Sepsis, unspecified organism: Secondary | ICD-10-CM | POA: Diagnosis not present

## 2019-10-04 DIAGNOSIS — Z992 Dependence on renal dialysis: Secondary | ICD-10-CM | POA: Diagnosis not present

## 2019-10-04 DIAGNOSIS — T827XXA Infection and inflammatory reaction due to other cardiac and vascular devices, implants and grafts, initial encounter: Secondary | ICD-10-CM | POA: Diagnosis not present

## 2019-10-04 DIAGNOSIS — N2581 Secondary hyperparathyroidism of renal origin: Secondary | ICD-10-CM | POA: Diagnosis not present

## 2019-10-04 DIAGNOSIS — R079 Chest pain, unspecified: Secondary | ICD-10-CM | POA: Diagnosis not present

## 2019-10-04 DIAGNOSIS — Z87442 Personal history of urinary calculi: Secondary | ICD-10-CM | POA: Diagnosis not present

## 2019-10-04 DIAGNOSIS — I25110 Atherosclerotic heart disease of native coronary artery with unstable angina pectoris: Secondary | ICD-10-CM | POA: Diagnosis not present

## 2019-10-04 DIAGNOSIS — T8249XA Other complication of vascular dialysis catheter, initial encounter: Secondary | ICD-10-CM | POA: Diagnosis not present

## 2019-10-04 HISTORY — PX: I & D EXTREMITY: SHX5045

## 2019-10-04 HISTORY — PX: APPLICATION OF WOUND VAC: SHX5189

## 2019-10-04 HISTORY — PX: VENOGRAM: SHX5497

## 2019-10-04 HISTORY — PX: AVGG REMOVAL: SHX5153

## 2019-10-04 LAB — RENAL FUNCTION PANEL
Albumin: 3.1 g/dL — ABNORMAL LOW (ref 3.5–5.0)
Anion gap: 14 (ref 5–15)
BUN: 43 mg/dL — ABNORMAL HIGH (ref 6–20)
CO2: 25 mmol/L (ref 22–32)
Calcium: 8.9 mg/dL (ref 8.9–10.3)
Chloride: 97 mmol/L — ABNORMAL LOW (ref 98–111)
Creatinine, Ser: 7.82 mg/dL — ABNORMAL HIGH (ref 0.61–1.24)
GFR calc Af Amer: 8 mL/min — ABNORMAL LOW (ref >60)
GFR calc non Af Amer: 7 mL/min — ABNORMAL LOW (ref >60)
Glucose, Bld: 87 mg/dL (ref 70–99)
Phosphorus: 5 mg/dL — ABNORMAL HIGH (ref 2.5–4.6)
Potassium: 5.6 mmol/L — ABNORMAL HIGH (ref 3.5–5.1)
Sodium: 136 mmol/L (ref 135–145)

## 2019-10-04 LAB — SURGICAL PCR SCREEN
MRSA, PCR: NEGATIVE
Staphylococcus aureus: NEGATIVE

## 2019-10-04 SURGERY — IRRIGATION AND DEBRIDEMENT EXTREMITY
Anesthesia: General | Site: Thigh | Laterality: Right

## 2019-10-04 MED ORDER — FENTANYL CITRATE (PF) 250 MCG/5ML IJ SOLN
INTRAMUSCULAR | Status: AC
  Filled 2019-10-04: qty 5

## 2019-10-04 MED ORDER — PHENYLEPHRINE HCL-NACL 10-0.9 MG/250ML-% IV SOLN
INTRAVENOUS | Status: DC | PRN
  Administered 2019-10-04: 25 ug/min via INTRAVENOUS

## 2019-10-04 MED ORDER — ALTEPLASE 2 MG IJ SOLR
INTRAMUSCULAR | Status: AC
  Administered 2019-10-04: 4 mg
  Filled 2019-10-04: qty 2

## 2019-10-04 MED ORDER — SODIUM POLYSTYRENE SULFONATE 15 GM/60ML PO SUSP
15.0000 g | ORAL | Status: DC
  Filled 2019-10-04 (×2): qty 60

## 2019-10-04 MED ORDER — FENTANYL CITRATE (PF) 100 MCG/2ML IJ SOLN
INTRAMUSCULAR | Status: AC
  Filled 2019-10-04: qty 2

## 2019-10-04 MED ORDER — ALTEPLASE 2 MG IJ SOLR
4.0000 mg | Freq: Once | INTRAMUSCULAR | Status: AC
  Filled 2019-10-04: qty 4

## 2019-10-04 MED ORDER — AMISULPRIDE (ANTIEMETIC) 5 MG/2ML IV SOLN
10.0000 mg | Freq: Once | INTRAVENOUS | Status: AC | PRN
  Administered 2019-10-04: 10 mg via INTRAVENOUS

## 2019-10-04 MED ORDER — CHLORHEXIDINE GLUCONATE 0.12 % MT SOLN: 15.0000 mL | Freq: Once | OROMUCOSAL | Status: DC

## 2019-10-04 MED ORDER — ONDANSETRON HCL 4 MG/2ML IJ SOLN
INTRAMUSCULAR | Status: DC | PRN
  Administered 2019-10-04: 4 mg via INTRAVENOUS

## 2019-10-04 MED ORDER — MORPHINE SULFATE (PF) 2 MG/ML IV SOLN
2.0000 mg | Freq: Once | INTRAVENOUS | Status: AC
  Administered 2019-10-04: 2 mg via INTRAVENOUS
  Filled 2019-10-04: qty 1

## 2019-10-04 MED ORDER — SODIUM CHLORIDE 0.9 % IV SOLN
INTRAVENOUS | Status: AC
  Filled 2019-10-04: qty 1.2

## 2019-10-04 MED ORDER — PROMETHAZINE HCL 25 MG PO TABS
ORAL_TABLET | ORAL | Status: AC
  Filled 2019-10-04: qty 1

## 2019-10-04 MED ORDER — SODIUM CHLORIDE 0.9 % IV SOLN: INTRAVENOUS | Status: DC

## 2019-10-04 MED ORDER — HEPARIN SODIUM (PORCINE) 1000 UNIT/ML IJ SOLN
INTRAMUSCULAR | Status: AC
  Administered 2019-10-04: 2000 [IU] via INTRAVENOUS_CENTRAL
  Filled 2019-10-04: qty 2

## 2019-10-04 MED ORDER — LACTATED RINGERS IV SOLN: INTRAVENOUS | Status: DC

## 2019-10-04 MED ORDER — SODIUM CHLORIDE 0.9 % IV SOLN: INTRAVENOUS | Status: DC | PRN

## 2019-10-04 MED ORDER — 0.9 % SODIUM CHLORIDE (POUR BTL) OPTIME
TOPICAL | Status: DC | PRN
  Administered 2019-10-04: 1000 mL

## 2019-10-04 MED ORDER — CHLORHEXIDINE GLUCONATE 0.12 % MT SOLN
OROMUCOSAL | Status: AC
  Filled 2019-10-04: qty 15

## 2019-10-04 MED ORDER — ACETAMINOPHEN 10 MG/ML IV SOLN: 1000.0000 mg | Freq: Once | INTRAVENOUS | Status: DC | PRN

## 2019-10-04 MED ORDER — FENTANYL CITRATE (PF) 250 MCG/5ML IJ SOLN
INTRAMUSCULAR | Status: DC | PRN
Start: 2019-10-04 — End: 2019-10-04
  Administered 2019-10-04 (×4): 50 ug via INTRAVENOUS
  Administered 2019-10-04 (×2): 25 ug via INTRAVENOUS

## 2019-10-04 MED ORDER — DEXAMETHASONE SODIUM PHOSPHATE 10 MG/ML IJ SOLN
INTRAMUSCULAR | Status: DC | PRN
  Administered 2019-10-04: 10 mg via INTRAVENOUS

## 2019-10-04 MED ORDER — HEPARIN SODIUM (PORCINE) 1000 UNIT/ML DIALYSIS: 2000.0000 [IU] | Freq: Once | INTRAMUSCULAR | Status: AC

## 2019-10-04 MED ORDER — MEPERIDINE HCL 25 MG/ML IJ SOLN: 6.2500 mg | INTRAMUSCULAR | Status: DC | PRN

## 2019-10-04 MED ORDER — MIDAZOLAM HCL 2 MG/2ML IJ SOLN
INTRAMUSCULAR | Status: DC | PRN
  Administered 2019-10-04: 2 mg via INTRAVENOUS

## 2019-10-04 MED ORDER — HEPARIN SODIUM (PORCINE) 1000 UNIT/ML IJ SOLN
INTRAMUSCULAR | Status: AC
  Filled 2019-10-04: qty 1

## 2019-10-04 MED ORDER — LIDOCAINE 2% (20 MG/ML) 5 ML SYRINGE
INTRAMUSCULAR | Status: DC | PRN
  Administered 2019-10-04: 40 mg via INTRAVENOUS

## 2019-10-04 MED ORDER — SUGAMMADEX SODIUM 200 MG/2ML IV SOLN
INTRAVENOUS | Status: DC | PRN
  Administered 2019-10-04: 300 mg via INTRAVENOUS

## 2019-10-04 MED ORDER — PROPOFOL 10 MG/ML IV BOLUS
INTRAVENOUS | Status: AC
  Filled 2019-10-04: qty 20

## 2019-10-04 MED ORDER — PROPOFOL 10 MG/ML IV BOLUS
INTRAVENOUS | Status: DC | PRN
  Administered 2019-10-04: 130 mg via INTRAVENOUS

## 2019-10-04 MED ORDER — ROCURONIUM BROMIDE 10 MG/ML (PF) SYRINGE
PREFILLED_SYRINGE | INTRAVENOUS | Status: DC | PRN
  Administered 2019-10-04: 10 mg via INTRAVENOUS
  Administered 2019-10-04: 50 mg via INTRAVENOUS

## 2019-10-04 MED ORDER — FENTANYL CITRATE (PF) 100 MCG/2ML IJ SOLN
25.0000 ug | INTRAMUSCULAR | Status: DC | PRN
  Administered 2019-10-04 (×2): 25 ug via INTRAVENOUS

## 2019-10-04 MED ORDER — HEPARIN SODIUM (PORCINE) 1000 UNIT/ML IJ SOLN
INTRAMUSCULAR | Status: DC | PRN
  Administered 2019-10-04: 5.8 [IU] via INTRAVENOUS

## 2019-10-04 MED ORDER — PROMETHAZINE HCL 25 MG PO TABS
25.0000 mg | ORAL_TABLET | Freq: Four times a day (QID) | ORAL | Status: DC | PRN
  Administered 2019-10-04 – 2019-10-05 (×2): 25 mg via ORAL
  Filled 2019-10-04: qty 1

## 2019-10-04 MED ORDER — ACETAMINOPHEN 160 MG/5ML PO SOLN: 325.0000 mg | Freq: Once | ORAL | Status: DC | PRN

## 2019-10-04 MED ORDER — ACETAMINOPHEN 325 MG PO TABS: 325.0000 mg | ORAL_TABLET | Freq: Once | ORAL | Status: DC | PRN

## 2019-10-04 MED ORDER — IODIXANOL 320 MG/ML IV SOLN
INTRAVENOUS | Status: DC | PRN
  Administered 2019-10-04: 5 mL

## 2019-10-04 MED ORDER — MIDAZOLAM HCL 2 MG/2ML IJ SOLN
INTRAMUSCULAR | Status: AC
  Filled 2019-10-04: qty 2

## 2019-10-04 MED ORDER — HEPARIN SODIUM (PORCINE) 1000 UNIT/ML DIALYSIS: 3700.0000 [IU] | INTRAMUSCULAR | Status: DC | PRN

## 2019-10-04 MED ORDER — AMISULPRIDE (ANTIEMETIC) 5 MG/2ML IV SOLN
INTRAVENOUS | Status: AC
  Filled 2019-10-04: qty 4

## 2019-10-04 SURGICAL SUPPLY — 75 items
BAG DECANTER FOR FLEXI CONT (MISCELLANEOUS) ×4 IMPLANT
BIOPATCH RED 1 DISK 7.0 (GAUZE/BANDAGES/DRESSINGS) ×4 IMPLANT
BLADE 11 SAFETY STRL DISP (BLADE) ×4 IMPLANT
BNDG ELASTIC 4X5.8 VLCR STR LF (GAUZE/BANDAGES/DRESSINGS) IMPLANT
BNDG ELASTIC 6X5.8 VLCR STR LF (GAUZE/BANDAGES/DRESSINGS) IMPLANT
BNDG GAUZE ELAST 4 BULKY (GAUZE/BANDAGES/DRESSINGS) IMPLANT
BOOT SUTURE AID YELLOW STND (SUTURE) ×4 IMPLANT
CANISTER PREVENA PLUS 150 (CANNISTER) ×4 IMPLANT
CANISTER SUCT 3000ML PPV (MISCELLANEOUS) ×4 IMPLANT
CANISTER WOUNDNEG PRESSURE 500 (CANNISTER) ×4 IMPLANT
CATH PALINDROME-P 19CM W/VT (CATHETERS) IMPLANT
CATH PALINDROME-P 23CM W/VT (CATHETERS) IMPLANT
CATH PALINDROME-P 28CM W/VT (CATHETERS) IMPLANT
CLIP VESOCCLUDE MED 6/CT (CLIP) ×4 IMPLANT
CLIP VESOCCLUDE SM WIDE 6/CT (CLIP) ×4 IMPLANT
COVER LIGHT HANDLE STERIS (MISCELLANEOUS) ×4 IMPLANT
COVER PROBE W GEL 5X96 (DRAPES) ×4 IMPLANT
COVER SURGICAL LIGHT HANDLE (MISCELLANEOUS) ×4 IMPLANT
COVER WAND RF STERILE (DRAPES) ×4 IMPLANT
DERMABOND ADVANCED (GAUZE/BANDAGES/DRESSINGS) ×1
DERMABOND ADVANCED .7 DNX12 (GAUZE/BANDAGES/DRESSINGS) ×3 IMPLANT
DRAPE C-ARM 42X72 X-RAY (DRAPES) ×4 IMPLANT
DRAPE CHEST BREAST 15X10 FENES (DRAPES) ×4 IMPLANT
DRAPE HALF SHEET 40X57 (DRAPES) IMPLANT
DRAPE U-SHAPE 76X120 STRL (DRAPES) IMPLANT
DRSG TEGADERM 4X4.75 (GAUZE/BANDAGES/DRESSINGS) ×4 IMPLANT
DRSG VAC ATS SM SENSATRAC (GAUZE/BANDAGES/DRESSINGS) ×4 IMPLANT
ELECT REM PT RETURN 9FT ADLT (ELECTROSURGICAL) ×4
ELECTRODE REM PT RTRN 9FT ADLT (ELECTROSURGICAL) ×3 IMPLANT
GAUZE 4X4 16PLY RFD (DISPOSABLE) ×4 IMPLANT
GAUZE SPONGE 2X2 8PLY STRL LF (GAUZE/BANDAGES/DRESSINGS) IMPLANT
GAUZE SPONGE 4X4 12PLY STRL (GAUZE/BANDAGES/DRESSINGS) ×4 IMPLANT
GAUZE XEROFORM 5X9 LF (GAUZE/BANDAGES/DRESSINGS) IMPLANT
GLOVE BIOGEL PI IND STRL 7.5 (GLOVE) ×9 IMPLANT
GLOVE BIOGEL PI INDICATOR 7.5 (GLOVE) ×3
GLOVE INDICATOR 7.5 STRL GRN (GLOVE) ×8 IMPLANT
GLOVE SURG SS PI 7.5 STRL IVOR (GLOVE) ×4 IMPLANT
GOWN STRL REUS W/ TWL LRG LVL3 (GOWN DISPOSABLE) ×6 IMPLANT
GOWN STRL REUS W/ TWL XL LVL3 (GOWN DISPOSABLE) ×3 IMPLANT
GOWN STRL REUS W/TWL LRG LVL3 (GOWN DISPOSABLE) ×2
GOWN STRL REUS W/TWL XL LVL3 (GOWN DISPOSABLE) ×1
HANDPIECE INTERPULSE COAX TIP (DISPOSABLE)
IV NS IRRIG 3000ML ARTHROMATIC (IV SOLUTION) ×4 IMPLANT
KIT BASIN OR (CUSTOM PROCEDURE TRAY) ×4 IMPLANT
KIT PALINDROME-P 55CM (CATHETERS) IMPLANT
KIT TURNOVER KIT B (KITS) ×4 IMPLANT
NEEDLE 18GX1X1/2 (RX/OR ONLY) (NEEDLE) ×4 IMPLANT
NEEDLE HYPO 25GX1X1/2 BEV (NEEDLE) ×4 IMPLANT
NS IRRIG 1000ML POUR BTL (IV SOLUTION) ×4 IMPLANT
PACK CV ACCESS (CUSTOM PROCEDURE TRAY) IMPLANT
PACK GENERAL/GYN (CUSTOM PROCEDURE TRAY) ×4 IMPLANT
PACK SURGICAL SETUP 50X90 (CUSTOM PROCEDURE TRAY) ×4 IMPLANT
PACK UNIVERSAL I (CUSTOM PROCEDURE TRAY) ×4 IMPLANT
PAD ARMBOARD 7.5X6 YLW CONV (MISCELLANEOUS) ×8 IMPLANT
PULSAVAC PLUS IRRIG FAN TIP (DISPOSABLE)
SET HNDPC FAN SPRY TIP SCT (DISPOSABLE) IMPLANT
SOAP 2 % CHG 4 OZ (WOUND CARE) ×4 IMPLANT
SPONGE GAUZE 2X2 STER 10/PKG (GAUZE/BANDAGES/DRESSINGS)
SUT ETHILON 3 0 PS 1 (SUTURE) ×4 IMPLANT
SUT VIC AB 2-0 CTX 36 (SUTURE) IMPLANT
SUT VIC AB 3-0 SH 27 (SUTURE)
SUT VIC AB 3-0 SH 27X BRD (SUTURE) IMPLANT
SUT VICRYL 4-0 PS2 18IN ABS (SUTURE) ×4 IMPLANT
SWAB CULTURE ESWAB REG 1ML (MISCELLANEOUS) ×4 IMPLANT
SWAB CULTURE LIQUID MINI MALE (MISCELLANEOUS) ×4 IMPLANT
SYR 10ML LL (SYRINGE) ×4 IMPLANT
SYR 20ML LL LF (SYRINGE) ×8 IMPLANT
SYR 30ML LL (SYRINGE) IMPLANT
SYR 5ML LL (SYRINGE) ×4 IMPLANT
SYR CONTROL 10ML LL (SYRINGE) ×4 IMPLANT
TIP FAN IRRIG PULSAVAC PLUS (DISPOSABLE) IMPLANT
TOWEL GREEN STERILE (TOWEL DISPOSABLE) ×4 IMPLANT
WATER STERILE IRR 1000ML POUR (IV SOLUTION) ×4 IMPLANT
WIRE BENTSON .035X145CM (WIRE) ×4 IMPLANT
WIRE LUNDERQUIST .035X180CM (WIRE) ×4 IMPLANT

## 2019-10-04 NOTE — Progress Notes (Signed)
Left thigh I&D with removal of retained gortex graft. Wound Vac placed on wound.  Measurements are 5x7x2cm Patient can go home today with vac, however if a vac can not be placed until Monday, he can have wet to dry dressing changes until the vac can be placed We are speaking with case manager about this   Annamarie Major

## 2019-10-04 NOTE — Progress Notes (Signed)
[facility] Progress Note   Subjective:  Seen in room - just got back from OR, had R femoral catheter manipulation and L thigh debridement with wound vac. Denies CP/dyspnea. Was dialyzed yesterday but cath ran poorly with frequent alarms. K higher today than yesterday (5.6) - explained would like to dialyze again today while still in the hospital to make sure that the cath is working better.  Objective Vitals:   10/04/19 0920 10/04/19 0935 10/04/19 0950 10/04/19 1022  BP: 95/60 (!) 97/50 93/70 93/66   Pulse: 63 (!) 58 64 61  Resp: 12 18 (!) 22 18  Temp: (!) 97.3 F (36.3 C)  98 F (36.7 C) 98.8 F (37.1 C)  TempSrc:    Oral  SpO2: 99% 98% 100% 98%  Weight:      Height:       Physical Exam General: Well appearing man, NAD. Room air Heart: RRR; 3/6 murmur Lungs: CTAB Back: Chronic wound to L flank from prior PCN tube - bandage falling off, mild erythema - he says this is improved Abdomen: soft, non-tender Extremities: Trace BLE edema Dialysis Access: R thigh TDC, L thigh with wound vac s/p debridement/old AVG removal  Additional Objective Labs: Basic Metabolic Panel: Recent Labs  Lab 10/03/19 0742 10/04/19 0507  NA 138 136  K 5.5* 5.6*  CL 96* 97*  CO2 26 25  GLUCOSE 90 87  BUN 59* 43*  CREATININE 9.34* 7.82*  CALCIUM 9.2 8.9  PHOS  --  5.0*   Liver Function Tests: Recent Labs  Lab 10/04/19 0507  ALBUMIN 3.1*   CBC: Recent Labs  Lab 10/03/19 0742  WBC 6.4  HGB 10.1*  HCT 34.5*  MCV 98.6  PLT 170   Studies/Results: DG Chest 2 View  Result Date: 10/03/2019 CLINICAL DATA:  Short of breath EXAM: CHEST - 2 VIEW COMPARISON:  12/07/2017 FINDINGS: The heart size and mediastinal contours are within normal limits. Both lungs are clear. The visualized skeletal structures are unremarkable. IMPRESSION: No active cardiopulmonary disease. Electronically Signed   By: Franchot Gallo M.D.   On: 10/03/2019 08:01   Medications:  . amisulpride      .  Chlorhexidine Gluconate Cloth  6 each Topical Q0600  . [START ON 10/06/2019] doxercalciferol  3 mcg Intravenous Q M,W,F-HD  . fentaNYL      . heparin  5,000 Units Subcutaneous Q8H  . levothyroxine  137 mcg Oral QAC breakfast  . lidocaine  2 patch Transdermal Daily  . pantoprazole  40 mg Oral QAC breakfast  . sodium polystyrene  15 g Oral Once per day on Sun Sat    Dialysis Orders: Center: [facility] on MWF. Optiflux 200NRe, Time: 4hr 65min, BFR 350, DFR 800, EDW 114.5kg, 2K/2.,25Ca, TDC heparin 3700 unit bolus Mircera 239mcg IV q 2 weeks- last dose 9/27 Venofer 100mg  q HD -last dose due today Phoslo 4 caps TO TID with meals and 3 with snacks Hectorol 3 mcg IV q HD Kayexalate 15g PO on non-dialysis days   Assessment/Plan: 1.  ESRD: S/p limited HD yesterday - cath was frequent alarming, very position and he had chronic L leg pain -- only got 3hr. Now s/p [facility] manipulation by Dr. Trula Slade this morning as well as L thigh debridement with old AVG removal. Will plan on another HD today to assure working better and treat the mild hyperkalemia. 2.  Hyperkalemia: Recurrent issue - pt takes kayexalate on non-dialysis today. HD again today, as above. 3. Hypertension/volume: BP low/stable.  Above EDW - no dyspnea. Aim for another 1.5- 2L today. 4.  Anemia: Hgb 10.1. ESA dosed 9/27, follow trend 5.  Metabolic bone disease: Ca/Phos ok. Continue hectorol and phoslo. 6.  Nutrition:  Renal diet with 1.2L fluid restriction recommended 7. Pleuritic chest pain/SOB: Possibly due to excess volume, though CXR is clear. Murmur on exam which is consistent with past notes. Patient believes symptoms are related to COVID vaccine. Monitor symptoms for resolution post HD. 8. L thigh wound/infected old AVG: S/p excision/debridement 10/2 - wound vac in place. 9. L flank wound: D/t prior PCN tube - some erythema, no drainage - pt reports this is improved, follow closely.   [name], PA-C 10/04/2019,  11:00 AM  [employer]

## 2019-10-04 NOTE — TOC Initial Note (Signed)
Transition of Care [facility]) - Initial/Assessment Note    Patient Details  Name: [name] MRN: [id number] Date of Birth: 1965/09/01  Transition of Care [facility] CM/SW Contact:    Carles Collet, RN Phone Number: 10/04/2019, 1:24 PM  Clinical Narrative:         Damaris Schooner w patient to discuss DC plan. He states he will be discharged tomorrow morning.  He states that he will return home, he lives w sister, and she will provide transport at DC. He states that he is active w [facility] RN for dressing changes prior to admission. He would like to continue. We discussed adding HHA services, he would like that. Notified Amy w [facility] RN ans [facility]HA for potential DC in AM.  Referral made around 9 this morning for wound VAC w [facility]. Traci emailed orders to [place], [place]. Now just waiting on delivery of VAC.            Expected Discharge Plan: [place] Barriers to Discharge: Continued Medical Work up   Patient Goals and CMS Choice Patient states their goals for this hospitalization and ongoing recovery are:: to go home      Expected Discharge Plan and Services Expected Discharge Plan: [place]   Discharge Planning Services: CM Consult Post Acute Care Choice: Home Health, Durable Medical Equipment                   DME Arranged: Vac DME Agency: [facility] Date DME Agency Contacted: 10/04/19 Time DME Agency Contacted: 0930 Representative spoke with at DME Agency: Traci HH Arranged: RN, Nurse's Aide [place] Agency: [facility] Date [place]: 10/04/19 Time HH Agency Contacted: 32 Representative spoke with at [place]: Gevena Barre  Prior Living Arrangements/Services   Lives with:: Relatives (Sister)              Current home services: Home RN    Activities of Daily Living Home Assistive Devices/Equipment: Wheelchair, Eyeglasses ADL Screening (condition at time of admission) Patient's cognitive ability adequate to safely  complete daily activities?: Yes Is the patient deaf or have difficulty hearing?: No Does the patient have difficulty seeing, even when wearing glasses/contacts?: No Does the patient have difficulty concentrating, remembering, or making decisions?: No Patient able to express need for assistance with ADLs?: Yes Does the patient have difficulty dressing or bathing?: No Independently performs ADLs?: Yes (appropriate for developmental age) Does the patient have difficulty walking or climbing stairs?: Yes Weakness of Legs: Both Weakness of Arms/Hands: None  Permission Sought/Granted                  Emotional Assessment              Admission diagnosis:  Hyperkalemia [E87.5] ESRD (end stage renal disease) ([place]) [F79.0] Complication associated with dialysis catheter [T82.9XXA] Patient Active Problem List   Diagnosis Date Noted  . Complication associated with dialysis catheter   . Hyperkalemia   . Dysphagia   . Ureteral stone with hydronephrosis 03/26/2019  . Malfunction of nephrostomy tube ([place])   . Nephrostomy complication ([place]) 24/09/7351  . Abnormal CT scan, gastrointestinal tract   . Flank pain 11/26/2018  . Nephrolithiasis 11/26/2018  . Hydronephrosis with renal and ureteral calculus obstruction 11/26/2018  . Left flank pain 11/26/2018  . ESRD on dialysis ([place]) 06/12/2018  . PAD (peripheral artery disease) ([place]) 12/22/2017  . Problem with dialysis access ([place]) 12/21/2017  . Anemia 12/21/2017  .  Hypothyroidism 12/21/2017  . GERD (gastroesophageal reflux disease) 12/21/2017  . Anxiety 12/21/2017  . Left great toe amputee ([name]) 12/19/2017  . Endocarditis of mitral valve   . Osteomyelitis of toe of left foot ([place])   . MSSA bacteremia 12/08/2017  . Fever   . Right flank pain   . ESRD (end stage renal disease) ([place]) 09/20/2017  . ESRD (end stage renal disease) on dialysis ([place]) 08/07/2017  . Nonhealing surgical wound 02/29/2016  . Clotted dialysis access ([place])  01/14/2016  . Removal of staples 07/19/2012  . Complication of AV dialysis fistula 06/27/2012  . End stage renal disease ([place]) 06/27/2012   PCP:  [name], MD Pharmacy:   [id number] [place], [address] [place] [address] Phone: [phone] Fax: [phone]  [facility] [place], [address] AT [address] [place] [address] Phone: [phone] Fax: [phone]     Social Determinants of Health (SDOH) Interventions    Readmission Risk Interventions No flowsheet data found.

## 2019-10-04 NOTE — Plan of Care (Signed)
  Problem: Clinical Measurements: Goal: Will remain free from infection Outcome: Progressing   Problem: Fluid Volume: Goal: Compliance with measures to maintain balanced fluid volume will improve Outcome: Progressing

## 2019-10-04 NOTE — Progress Notes (Signed)
Family Medicine Teaching Service Daily Progress Note Intern Pager: [phone]  Patient name: [name] Medical record number: [id number] Date of birth: 01-17-1965 Age: 54 y.o. Gender: male  Primary Care Provider: [name], MD Consultants: vascular surgery, neprhology  Code Status: Full   Pt Overview and Major Events to Date:  Admitted 10/1  Assessment and Plan:  [name] is a 54 y.o. male presenting with inability to obtain access at his right femoral dialysis catheter while at HD. PMH is significant for ESRD on HD (MWF), PAD, kidney stones, spina bifida, hx of nephrostomy tube, HTN, GERD, anxiety.  Dialysis access issue Patient with longstanding history of ESRD on HD MWF via dialysisort catheter in his right femoral, sent to the ED while at HD because they were unable to use the catheter for dialysis with concern that the catheter may be kinked.  Vascular surgery evaluated patient in the ED and noted no obvious kinks on external aspect of the catheter. Nephrology consulted in ED. Dialysis was attempted yesterday on 10/1 but was terminated early at patient's request due to not having pain medication other than tylenol. Additionally there were issues with HD cath throughout treatment. - Plan for dialysis catheter exchange today - renal diet, NPO for procedure today  ESRD  On HD MWF.  Longstanding history of ESRD, has been on dialysis for about 25 years.  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 - HD as above  Left Thigh Wound Pt has a draining open wound in the left thigh from a failed left femoral graft. Vascular surgery consulted in ED and is planning to excise the graft and exploring this wound in the operating room today. - monitor for signs of infection   Hyperkalemia Takes Kayexalate (powdered) on Saturdays and Sundays. Mildly hyperkalemic 5.5 on admission.  EKG in  NSR without any peaked T waves or acute ST or T wave abnormalities. Etiology likely due to missed hemodialysis.  - HD as above - Cardiac monitoring  - Restarted home Kayexalate 15g mixed in water Sat and Sun  Dysphagia Patient reporting dysphagia over the past week, feeling food and medications stuck in his throat.  Able to drink liquids fine.  Also has been having some throat pain. This could represent underlying esophageal stricture related to his GERD. - SLP eval - can consider MBS pending SLP eval results - Consider ST neck if patient becomes febrile  - If workup benign, consider ENT follow up outpatient   GERD Reports having more chest pain recently, partially due to acid reflux.  States he has been eating and lying flat right after eating more.  Reports taking famotidine 20 mg daily. - consider switching to PPI if sx not controlled with H2 blocker   Hypothyroidism  Home medications include 137 mcg Levothyroxine.  - Con't Levothyroxine 137 mcg   Anxiety Takes 1 mg Ativan daily prn for anxiety.  Likely contributing to chest pain and SOB. - holding Ativan   Chronic pain  Takes Perocet 10 mg QID prn. - Con't Percocet 10mg  QID prn   Insomnia  Takes Ambien and Trazodone  at bedtime. Reports previous hx of night terrors related to starting dialysis which resolved with trazodone.  - Con't Ambien and Trazodone   Nausea  Takes promethazine in the morning.  FEN/GI: NPO for procedure today PPx: Heparin   Disposition: Med-surg  Subjective:   Patient sleeping when I came in the room. He  states he is feeling fine, but was bothered when someone told him he would go home after his dialysis catheter replacement today. He states that in the past he has had issues when it was replaced and requests to stay until he can try it out at his next dialysis session on Monday. He denies any other complaints this morning.   Objective: Temp:  [98.9 F (37.2 C)-99.9 F (37.7 C)] 99 F  (37.2 C) (10/02 0017) Pulse Rate:  [64-76] 64 (10/02 0017) Resp:  [12-23] 18 (10/02 0017) BP: (90-164)/(47-111) 100/47 (10/02 0017) SpO2:  [96 %-100 %] 98 % (10/02 0017) Weight:  [254 kg] 117 kg (10/01 0732) Physical Exam: General: obese male sleeping comfortably in bed. NAD  Cardiovascular: RRR. 3/6 systolic murmur heard best at LUSB Respiratory: CTA bilaterally. Normal WOB  Abdomen: Soft, non distended  Extremities: warm, dry, no edema  Derm: Port catheter on right thigh without erythema, swelling or drainage. Wound on L thigh with dressing.   Laboratory: Recent Labs  Lab 10/03/19 0742  WBC 6.4  HGB 10.1*  HCT 34.5*  PLT 170   Recent Labs  Lab 10/03/19 0742  NA 138  K 5.5*  CL 96*  CO2 26  BUN 59*  CREATININE 9.34*  CALCIUM 9.2  GLUCOSE 90     Imaging/Diagnostic Tests:  DG Chest 2 View Result Date: 10/03/2019 IMPRESSION: No active cardiopulmonary disease.    Shary Key, DO 10/04/2019, 12:21 AM PGY-1, [place] Intern pager: [phone], text pages welcome

## 2019-10-04 NOTE — Plan of Care (Signed)
  Problem: Education: Goal: Knowledge of General Education information will improve Description Including pain rating scale, medication(s)/side effects and non-pharmacologic comfort measures Outcome: Progressing   

## 2019-10-04 NOTE — Interval H&P Note (Signed)
History and Physical Interval Note:  10/04/2019 7:27 AM  [name]  has presented today for surgery, with the diagnosis of ESRD.  The various methods of treatment have been discussed with the patient and family. After consideration of risks, benefits and other options for treatment, the patient has consented to  Procedure(s): IRRIGATION AND DEBRIDEMENT LEFT THIGH (N/A) EXCHANGE OF A DIALYSIS CATHETER (N/A) as a surgical intervention.  The patient's history has been reviewed, patient examined, no change in status, stable for surgery.  I have reviewed the patient's chart and labs.  Questions were answered to the patient's satisfaction.     Annamarie Major

## 2019-10-04 NOTE — Evaluation (Signed)
Clinical/Bedside Swallow Evaluation Patient Details  Name: [name] MRN: [id number] Date of Birth: 05-17-1965  Today's Date: 10/04/2019 Time: SLP Start Time (ACUTE ONLY): 1100 SLP Stop Time (ACUTE ONLY): 1126 SLP Time Calculation (min) (ACUTE ONLY): 26 min  Past Medical History:  Past Medical History:  Diagnosis Date  . Anemia   . Anxiety   . Constipation   . End stage renal failure on dialysis [facility])    [place]; MWF, on HD since 1997, exhausted upper ext access, and possibly LE access; cath dependent in R groin as of 06/2018  . GERD (gastroesophageal reflux disease)   . Grand mal seizure ([place]) X 4   last 1998 (02/29/2016)  . Heart murmur    no problems with per pt. - nephrologist and Dr. Coletta Memos  . History of blood transfusion 2000s   "I was having blood loss; never found out from where"  . History of kidney stones   . Hypertension    hx of - has not taken bp meds in over 2 years  since dialysis  . Hypothyroidism   . Insomnia   . Migraine    "due to my BP in my late 1990s; none since" (02/29/2016)  . Nonhealing surgical wound    of the left arteriovenous graft  . Pneumonia    x 2  . Spina bifida [facility])    Past Surgical History:  Past Surgical History:  Procedure Laterality Date  . AMPUTATION Left 12/11/2017   Procedure: Left Great Toe Amputation;  Surgeon: [name], MD;  Location: [place];  Service: Orthopedics;  Laterality: Left;  . APPENDECTOMY    . ARTERIOVENOUS GRAFT PLACEMENT Left 10/16/2012   left femoral goretex graft         Dr Donnetta Hutching  . AV FISTULA PLACEMENT Left 06/27/2012   Procedure: EXPLORATORY LEFT THY-GRAFT PSEUDO-ANEURYSM;  Surgeon: Conrad [place], MD;  Location: [place];  Service: Vascular;  Laterality: Left;  Revision of left Arteriovenus gortex graft in thigh.  . COLONOSCOPY    . FLEXIBLE SIGMOIDOSCOPY N/A 12/03/2018   Procedure: FLEXIBLE SIGMOIDOSCOPY;  Surgeon: Yetta Flock, MD;  Location: [place];  Service: Gastroenterology;   Laterality: N/A;  . I & D EXTREMITY Left 02/29/2016   Procedure: DEBRIDEMENT LEFT THIGH WOUND;  Surgeon: Waynetta Sandy, MD;  Location: [place];  Service: Vascular;  Laterality: Left;  . ILEOSTOMY  1970s  . INCISION AND DRAINAGE Left 10/16/2012   Procedure: INCISION AND Debridement left thigh graft;  Surgeon: Rosetta Posner, MD;  Location: [place];  Service: Vascular;  Laterality: Left;  . INSERTION OF DIALYSIS CATHETER    . INSERTION OF DIALYSIS CATHETER  01/14/2016   Procedure: INSERTION OF DIALYSIS CATHETER;  Surgeon: Waynetta Sandy, MD;  Location: [place];  Service: Vascular;;  . INSERTION OF DIALYSIS CATHETER Right 08/07/2017   Procedure: INSERTION OF DIALYSIS CATHETER;  Surgeon: Waynetta Sandy, MD;  Location: [place];  Service: Vascular;  Laterality: Right;  . INSERTION OF DIALYSIS CATHETER Right 12/21/2017   Procedure: RIGHT FEMORAL DIALYSIS CATHETER EXCHANGE;  Surgeon: Marty Heck, MD;  Location: [place];  Service: Vascular;  Laterality: Right;  . INSERTION OF ILIAC STENT Left 01/14/2016   Procedure: INSERTION OF ILIAC STENT;  Surgeon: Waynetta Sandy, MD;  Location: [place];  Service: Vascular;  Laterality: Left;  . INTRAOPERATIVE ARTERIOGRAM Left 01/14/2016   Procedure: INTRA OPERATIVE ARTERIOGRAM;  Surgeon: Waynetta Sandy, MD;  Location: [place];  Service: Vascular;  Laterality:  Left;  . IR NEPHROSTOMY EXCHANGE LEFT  03/21/2019  . IR NEPHROSTOMY PLACEMENT LEFT  11/28/2018  . LITHOTRIPSY  x 3   "& a laser treatment" (02/29/2016)  . NEPHROLITHOTOMY Left 03/26/2019   Procedure: NEPHROLITHOTOMY PERCUTANEOUS;  Surgeon: Alexis Frock, MD;  Location: [facility];  Service: Urology;  Laterality: Left;  2 HRS  . NEPHROSTOMY Bilateral 1968  . PATELLAR TENDON REPAIR Right 1990s   "big incision"  . PERIPHERAL VASCULAR CATHETERIZATION  01/14/2016   Procedure: A/V SHUNTOGRAM;  Surgeon: Waynetta Sandy, MD;  Location: [place];  Service: Vascular;;  .  REVISION OF ARTERIOVENOUS GORETEX GRAFT Left 10/16/2012   Procedure: REVISION OF LEFT FEMORAL LOOP ARTERIOVENOUS GORETEX GRAFT;  Surgeon: Rosetta Posner, MD;  Location: [place];  Service: Vascular;  Laterality: Left;  . REVISION OF ARTERIOVENOUS GORETEX GRAFT Left 02/11/2015   Procedure: EXCISION OF SMALL SEGMENT OF EXPOSED LEFT THIGH NON FUNCTIONING  ARTERIOVENOUS GORETEX GRAFT;  Surgeon: Mal Misty, MD;  Location: [place];  Service: Vascular;  Laterality: Left;  . REVISION OF ARTERIOVENOUS GORETEX GRAFT Left 01/14/2016   Procedure: REVISION OF ARTERIOVENOUS GORETEX GRAFT;  Surgeon: Waynetta Sandy, MD;  Location: [place];  Service: Vascular;  Laterality: Left;  . REVISION OF ARTERIOVENOUS GORETEX GRAFT Left 02/29/2016   thigh/pt report  . REVISION OF ARTERIOVENOUS GORETEX GRAFT Left 02/29/2016   Procedure: POSSIBLE REVISION OF LEFT THIGH ARTERIOVENOUS GORETEX GRAFT;  Surgeon: Waynetta Sandy, MD;  Location: [place];  Service: Vascular;  Laterality: Left;  . TEE WITHOUT CARDIOVERSION N/A 12/14/2017   Procedure: TRANSESOPHAGEAL ECHOCARDIOGRAM (TEE);  Surgeon: Acie Fredrickson, Wonda Cheng, MD;  Location: [place];  Service: Cardiovascular;  Laterality: N/A;  . THROMBECTOMY AND REVISION OF ARTERIOVENTOUS (AV) GORETEX  GRAFT Left 06/12/2018   Procedure: Removal of infected left thigh graft;  Surgeon: Rosetta Posner, MD;  Location: [place];  Service: Vascular;  Laterality: Left;  . THROMBECTOMY W/ EMBOLECTOMY Left 01/14/2016   Procedure: THROMBECTOMY ARTERIOVENOUS GORE-TEX Left thigh GRAFT;  Surgeon: Waynetta Sandy, MD;  Location: [place];  Service: Vascular;  Laterality: Left;  . TONGUE SURGERY  ~ 1990   tongue-tie release   . ULTRASOUND GUIDANCE FOR VASCULAR ACCESS Right 08/07/2017   Procedure: ULTRASOUND GUIDANCE FOR VASCULAR CANNULATION RIGHT FEMORAL VEIN AND LEFT AV FEMORAL GRAFT;  Surgeon: Waynetta Sandy, MD;  Location: [place];  Service: Vascular;  Laterality: Right;  . UPPER  EXTREMITY VENOGRAPHY Bilateral 08/09/2017   Procedure: UPPER EXTREMITY VENOGRAPHY;  Surgeon: Serafina Mitchell, MD;  Location: [place] [facility];  Service: Cardiovascular;  Laterality: Bilateral;  . VENOGRAM N/A 09/20/2017   Procedure: RIGHT COMMON FEMORAL ARTERY EXPLORATION.  CANNULATION RIGHT COMMON FEMORAL VEIN. VENOGRAM CENTRAL ULTRA SOUND GUIDED RIGHT FEMORAL VEIN TIMES TWO.;  Surgeon: Waynetta Sandy, MD;  Location: [place];  Service: Vascular;  Laterality: N/A;  . WOUND DEBRIDEMENT Left 02/29/2016   thigh   HPI:  [name] is a 54 y.o. male presenting with inability to obtain access at his right femoral dialysis catheter while at HD. PMH is significant for ESRD on HD (MWF), PAD, kidney stones, spina bifida, hx of nephrostomy tube, HTN, GERD, anxiety. S/P irrigation and debridement of left thigh and echange of dialysis catheter 10/2 am.    Assessment / Plan / Recommendation Clinical Impression  Swallow evaluation complete at bedside. Patient presents with what appears to be a normal oropharyngeal swallow based on today's exam however despite normal oral transit of bolus, seemingly timely swallow initiation  and strength based on laryngeal palpation, and clear vocal quality, patient grimaces with every swallow, has constant throat clearing, and complaints of "food particles getting stuck on tonsills" and his swallowing function effecting his hearing. Patient reports similiar complaints in his early 65s (30 years ago), eleviated by a tongue tie repair. Patient does have a h/o GER which could account for chronic throat clearing and complaints of globus however most complaints are of food and pills getting stuck in back of throat closer to base of tongue/tonsills. None observed by SLP. Offered patient MBS to evaluate swallowing physiology under the thought that if MBS did not show residuals that complaints are  likely referred pain/sensation from esophageal dysfunction. Patient however  refused MBS reporting that he cannot tolerate drinking barium. FEES offered as instrumental testing which patient in agreement. Although this will not be as effective in showing base of tongue/tonsillar residuals, it will effectively diagnose pharyngeal phase of swallow. PEnding results, GI and/or ENT consult may be beneficial. Can plan for FEES on 10/4 however if patient discharges prior, this can be set up as OP by calling [phone].  SLP Visit Diagnosis: Dysphagia, unspecified (R13.10)    Aspiration Risk       Diet Recommendation Regular;Thin liquid   Liquid Administration via: Cup;Straw Medication Administration: Whole meds with liquid Supervision: Patient able to self feed Compensations: Slow rate;Small sips/bites Postural Changes: Seated upright at 90 degrees;Remain upright for at least 30 minutes after po intake    Other  Recommendations Oral Care Recommendations: Oral care BID   Follow up Recommendations  (TBD)        Swallow Study   General HPI: [name] is a 54 y.o. male presenting with inability to obtain access at his right femoral dialysis catheter while at HD. PMH is significant for ESRD on HD (MWF), PAD, kidney stones, spina bifida, hx of nephrostomy tube, HTN, GERD, anxiety. S/P irrigation and debridement of left thigh and echange of dialysis catheter 10/2 am.  Type of Study: Bedside Swallow Evaluation Previous Swallow Assessment: none Diet Prior to this Study: Regular;Thin liquids (renal) Temperature Spikes Noted: No Respiratory Status: Room air History of Recent Intubation:  (surgery only) Behavior/Cognition: Alert;Cooperative;Pleasant mood Oral Cavity Assessment: Within Functional Limits Oral Care Completed by SLP: No Oral Cavity - Dentition: Adequate natural dentition Vision: Functional for self-feeding Self-Feeding Abilities: Able to feed self Patient Positioning: Upright in bed Baseline Vocal Quality: Normal Volitional Cough: Strong Volitional  Swallow: Able to elicit    Oral/Motor/Sensory Function Overall Oral Motor/Sensory Function: Within functional limits   Ice Chips Ice chips: Not tested   Thin Liquid Thin Liquid: Within functional limits    Nectar Thick Nectar Thick Liquid: Not tested   Honey Thick Honey Thick Liquid: Not tested   Puree Puree: Within functional limits Presentation: Self Fed;Spoon   Solid     Solid: Within functional limits Presentation: [place] MA, CCC-SLP   Youa Deloney Meryl 10/04/2019,11:38 AM

## 2019-10-04 NOTE — Anesthesia Procedure Notes (Signed)
Procedure Name: Intubation Date/Time: 10/04/2019 7:51 AM Performed by: Clearnce Sorrel, CRNA Pre-anesthesia Checklist: Patient identified, Emergency Drugs available, Suction available, Patient being monitored and Timeout performed Patient Re-evaluated:Patient Re-evaluated prior to induction Preoxygenation: Pre-oxygenation with 100% oxygen Induction Type: IV induction Ventilation: Mask ventilation without difficulty and Oral airway inserted - appropriate to patient size Laryngoscope Size: Mac and 4 Grade View: Grade I Tube type: Oral Tube size: 7.5 mm Number of attempts: 1 Airway Equipment and Method: Stylet Placement Confirmation: ETT inserted through vocal cords under direct vision,  positive ETCO2 and breath sounds checked- equal and bilateral Secured at: 23 cm Tube secured with: Tape Dental Injury: Teeth and Oropharynx as per pre-operative assessment

## 2019-10-04 NOTE — Op Note (Signed)
    Patient name: [name] MRN: [id number] DOB: 1965/10/01 Sex: male  10/04/2019 Pre-operative Diagnosis: ESRD Post-operative diagnosis:  Same Surgeon:  Annamarie Major Assistants:  Arlee Muslim Procedure:   #1: Inferior venacavogram   #2: I&D of left thigh including resection of multiple old dialysis grafts and overlying skin.   #3: Placement of wound VAC to left thigh (7 x 5 x 2 cm) Anesthesia: General Blood Loss: 50 cc Specimens: Anaerobic and aerobic cultures of the left thigh wound were sent  Findings: The vena cava was patent above the tip of the dialysis catheter.  A Bentson wire was able to be easily passed through the catheter.  However when I tried to insert a Lunderquist wire, I had difficulty getting the wire out of the catheter.  Indications: This is a 54 year old gentleman with end-stage renal disease who had trouble dialyzing through his catheter.  He also has drainage from his left thigh from old dialysis access.  He was able to dialyze yesterday in the hospital.  There was concern over kinking within his existing catheter.  Procedure:  The patient was identified in the holding area and taken to [place] [facility]  The patient was then placed supine on the table. general anesthesia was administered.  The patient was prepped and draped in the usual sterile fashion.  A time out was called and antibiotics were administered.  A PA was necessary to expedite the procedure and help with suctioning and retraction.  I first began by removing the heparin from the existing dialysis catheter.  Both ports flushed easily however the blue port had difficulty with aspirating.  It did aspirate but just at a slower rate.  I then inserted a Bentson wire through the red and blue port which went without difficulty.  A contrast injection was performed through the catheter which showed a patent vena cava above the tip of the catheter.  I then inserted a Lunderquist wire through the catheter.  It went in  easily however I had some difficulty getting the wire to come out.  I elected not to exchange the catheter.  Next attention was turned towards the left thigh.  An elliptical incision was made over the area of skin that was draining.  There was some turbid fluid and so anaerobic and aerobic cultures were sent.  I used a 10 blade to cut through the tissue and encountered multiple grafts.  I excised the grafts within the incision.  They all appear to be well incorporated.  Hemostasis was then achieved.  The deep tissue was reapproximated with interrupted 2-0 Vicryl.  The wound measured 7 x 5 x 2 cm.  A wound VAC was then placed.  Patient was successfully extubated and taken recovery in stable condition.   Disposition: To PACU stable   V. Annamarie Major, M.D., [facility] [place] Office: [phone] Pager:  [phone]

## 2019-10-04 NOTE — Transfer of Care (Signed)
Immediate Anesthesia Transfer of Care Note  Patient: [name]  Procedure(s) Performed: IRRIGATION AND DEBRIDEMENT LEFT THIGH (Left Thigh) VENOGRAM (Right Thigh) REMOVAL OF ARTERIOVENOUS GORETEX GRAFT (AVGG) (Left Thigh) APPLICATION OF WOUND VAC (Left Thigh)  Patient Location: PACU  Anesthesia Type:General  Level of Consciousness: awake, alert  and oriented  Airway & Oxygen Therapy: Patient Spontanous Breathing  Post-op Assessment: Report given to RN and Post -op Vital signs reviewed and stable  Post vital signs: Reviewed and stable  Last Vitals:  Vitals Value Taken Time  BP 95/60 10/04/19 0920  Temp    Pulse 63 10/04/19 0923  Resp 16 10/04/19 0923  SpO2 98 % 10/04/19 0923  Vitals shown include unvalidated device data.  Last Pain:  Vitals:   10/04/19 0425  TempSrc: Oral  PainSc:       Patients Stated Pain Goal: 0 (28/97/91 5041)  Complications: No complications documented.

## 2019-10-04 NOTE — Care Management (Addendum)
Notified by Arlee Muslim, PA that patient will need wound VAC. Patient is currently finishing in OR.  Measurements are 5x7x2. Spoke w Traci B at [facility]. She will email order for home wound VAC to [facility] for e-signature.  CM will speak w patient about HH when he returns from OR.

## 2019-10-04 NOTE — Anesthesia Postprocedure Evaluation (Signed)
Anesthesia Post Note  Patient: [name]  Procedure(s) Performed: IRRIGATION AND DEBRIDEMENT LEFT THIGH (Left Thigh) VENOGRAM (Right Thigh) REMOVAL OF ARTERIOVENOUS GORETEX GRAFT (AVGG) (Left Thigh) APPLICATION OF WOUND VAC (Left Thigh)     Patient location during evaluation: PACU Anesthesia Type: General Level of consciousness: awake and alert Pain management: pain level controlled Vital Signs Assessment: post-procedure vital signs reviewed and stable Respiratory status: spontaneous breathing, nonlabored ventilation, respiratory function stable and patient connected to nasal cannula oxygen Cardiovascular status: blood pressure returned to baseline and stable Postop Assessment: no apparent nausea or vomiting Anesthetic complications: no   No complications documented.  Last Vitals:  Vitals:   10/04/19 0950 10/04/19 1022  BP: 93/70 93/66  Pulse: 64 61  Resp: (!) 22 18  Temp: 36.7 C 37.1 C  SpO2: 100% 98%    Last Pain:  Vitals:   10/04/19 1022  TempSrc: Oral  PainSc:                  Effie Berkshire

## 2019-10-05 ENCOUNTER — Inpatient Hospital Stay (HOSPITAL_COMMUNITY): Payer: Medicare Other

## 2019-10-05 ENCOUNTER — Encounter (HOSPITAL_COMMUNITY): Payer: Self-pay | Admitting: Surgery

## 2019-10-05 DIAGNOSIS — I350 Nonrheumatic aortic (valve) stenosis: Secondary | ICD-10-CM

## 2019-10-05 DIAGNOSIS — R011 Cardiac murmur, unspecified: Secondary | ICD-10-CM

## 2019-10-05 LAB — RENAL FUNCTION PANEL
Albumin: 3.3 g/dL — ABNORMAL LOW (ref 3.5–5.0)
Anion gap: 12 (ref 5–15)
BUN: 41 mg/dL — ABNORMAL HIGH (ref 6–20)
CO2: 26 mmol/L (ref 22–32)
Calcium: 9.6 mg/dL (ref 8.9–10.3)
Chloride: 99 mmol/L (ref 98–111)
Creatinine, Ser: 7.27 mg/dL — ABNORMAL HIGH (ref 0.61–1.24)
GFR calc Af Amer: 9 mL/min — ABNORMAL LOW (ref >60)
GFR calc non Af Amer: 8 mL/min — ABNORMAL LOW (ref >60)
Glucose, Bld: 111 mg/dL — ABNORMAL HIGH (ref 70–99)
Phosphorus: 4.4 mg/dL (ref 2.5–4.6)
Potassium: 4.5 mmol/L (ref 3.5–5.1)
Sodium: 137 mmol/L (ref 135–145)

## 2019-10-05 LAB — ECHOCARDIOGRAM COMPLETE
AR max vel: 0.94 cm2
AV Area VTI: 0.77 cm2
AV Area mean vel: 0.89 cm2
AV Mean grad: 37.3 mmHg
AV Peak grad: 60.1 mmHg
Ao pk vel: 3.88 m/s
Area-P 1/2: 2.17 cm2
Height: 71 in
S' Lateral: 3.6 cm
Single Plane A4C EF: 55.3 %
Weight: 4102.32 oz

## 2019-10-05 MED ORDER — IOHEXOL 350 MG/ML SOLN
75.0000 mL | Freq: Once | INTRAVENOUS | Status: AC | PRN
  Administered 2019-10-05: 75 mL via INTRAVENOUS

## 2019-10-05 MED ORDER — MUSCLE RUB 10-15 % EX CREA
TOPICAL_CREAM | CUTANEOUS | Status: DC | PRN
  Filled 2019-10-05: qty 85

## 2019-10-05 MED ORDER — SODIUM ZIRCONIUM CYCLOSILICATE 10 G PO PACK
10.0000 g | PACK | Freq: Once | ORAL | Status: AC
  Administered 2019-10-05: 10 g via ORAL
  Filled 2019-10-05: qty 1

## 2019-10-05 MED ORDER — FAMOTIDINE 20 MG PO TABS
20.0000 mg | ORAL_TABLET | Freq: Every day | ORAL | Status: DC
  Administered 2019-10-05 – 2019-10-09 (×5): 20 mg via ORAL
  Filled 2019-10-05 (×5): qty 1

## 2019-10-05 MED ORDER — HYDROMORPHONE HCL 1 MG/ML IJ SOLN
1.0000 mg | Freq: Once | INTRAMUSCULAR | Status: AC
  Administered 2019-10-05: 1 mg via INTRAVENOUS
  Filled 2019-10-05: qty 1

## 2019-10-05 NOTE — Progress Notes (Signed)
  Echocardiogram 2D Echocardiogram has been performed.  Merrie Roof F 10/05/2019, 3:07 PM

## 2019-10-05 NOTE — Progress Notes (Signed)
Per RNCM, wound vac won't be delivered until tomorrow, so patient will remain inpatient until then.  Resident team discussed echo finding of severe AS with cardiology, who recommended outpatient follow up.  Anticipate discharge home tomorrow.  Leeanne Rio, MD

## 2019-10-05 NOTE — Hospital Course (Addendum)
[name] is a 54 y.o. male with a history of ESRD on HD (MWF), PAD, kidney stones, spina bifida, hx of nephrostomy tube, HTN, GERD, anxiety who presented with inability to obtain access at his right femoral dialysis catheter at HD. Hospital course outlined by problem below:  Dialysis access issue Patient with longstanding history of ESRD on HD MWF dialysis port catheter in his right femoral, sent to the ED while HD because they were unable to use the catheter for dialysis and with concern that the catheter may be kinked.  Vascular surgery evaluated the patient in the ED and noted no obvious kinks in the external aspect of the catheter.  HD was attempted on 10/1, but was terminated early at the patient request due to pain.  On 10/2 patient underwent right inferior vena cavogram by Dr. Trula Slade.  He elected not to exchange the catheter.  Later that day, patient underwent HD without any issues.  He did not have any further issues with subsequent hemodialysis during hospitalization.  Infected left thigh wound Patient presented with a draining open wound in the left thigh from a failed left femoral graft.  On 10/2, patient underwent I&D of left thigh with graft excision.  Cultures were sent in the OR.  Gram stain with no WBCs and rare gram-positive rods, culture grew few corynebacterium striatum.  He was on broad-spectrum antibiotics at that time due to concern for septic arthritis.  ID was consulted and antibiotics were narrowed with plan to continue antibiotics with dialysis following discharge.  Wound VAC was placed following the procedure, but had to be removed later that evening because it was not functioning properly.  A wet-to-dry dressing was placed and was exchanged the following morning.  He had the wound VAC replaced again, but this was removed due to his acute knee pain and swelling that developed shortly afterwards, so he was switched back to wet-to-dry dressing.  He did have wound VAC replaced  prior to discharge, which remained in place without any difficulties.***  ESRD On MWF dialysis.  As above, HD was terminated early on 10/1 due to pain and he had successful HD on 10/2 following inferior venacavogram.  He resumed MWF dialysis while hospitalized.  Acute left knee pain On the night of 10/3, patient developed acute onset of left knee pain with swelling and warmth.  He shortly became febrile up to 101.3 F.  That night, he underwent joint aspiration of his left knee with a significant amount of blood aspirated.  Synovial fluid with 08X WBC (neutrophilic predominance), no crystals.  No organisms seen on Gram stain.  Cultures were negative.  Due to concern for septic arthritis, he was started on broad-spectrum antibiotics and orthopedics was consulted.  On 10/5, patient underwent arthroscopic irrigation and debridement with placement of antibiotic beads and Hemovac drain.  Knee pain was improved prior to discharge and patient remained afebrile  Dysphagia Patient has had chronic issue with throat clearing over the past several years, now with recent worsening with dysphagia with food and medications over the past week.  He had normal oropharyngeal swallow on SLP eval.  Recommended to have ENT eval outpatient.  Atypical chest pain Patient had intermittent, reproducible chest pain with pleuritic component throughout hospitalization.  EKGs were obtained which were NSR. CTA chest was obtained which did not reveal any PE.  He did have a TTE during admission which was unremarkable except for grade 2 diastolic dysfunction, mild LVH, moderate left atrial dilation, trivial mitral valve regurgitation  with moderate mitral annular calcification, and severe aortic stenosis.  EF 65 to 70%.  Due to substernal chest pain while working with PT on 10/12, cardiology was consulted.  Troponins obtained were flat and repeat EKG was unremarkable.  Low suspicion for cardiac cause per cardiology. He again had chest pain  while working with PT on 10/13. EKG was repeated which was unremarkable. He was evaluated again by cardiology and had a gated cardiac CTA the following day on 10/14, which showed a coronary calcium score of 1158 (99th percentile), heavy diffuse predominantly calcified plaque and at least moderate stenosis in the mid LAD, proximal and distal RCA. On 10/15, patient underwent cardiac catheterization, which showed***.  Aortic stenosis, severe Patient with known history of heart murmur.  Given symptoms of chest pain and prior history of endocarditis, TTE was ordered which revealed severe aortic stenosis. Gated cardiac CTA also confirmed severe aortic stenosis with calcium score 2882. Discussed case with cardiology, recommended outpatient follow-up.  Not a candidate for AVR during admission given current infection and history of endocarditis.  GERD Recently with worsening GERD despite being on famotidine 20 mg daily at home.  He was started on pantoprazole 40 mg daily during admission, which was discontinued prior to discharge.  Anxiety Home medication lorazepam was discontinued during admission.  Patient was started on escitalopram and hydroxyzine as needed.  Antibiotics Piperacillin-tazobactam (10/4-10/6) Ceftriaxone (10/6-10/7) Vancomycin MWF with HD (10/4-) Ceftazidime (10/8-) Plan to continue vancomycin 1 g and ceftazidime 2 g MWF post-HD until 11/04/19 with need for weekly CBC, CMP, CRP, and pre-dialysis vancomycin level.  Other problems chronic and stable.  F/u *** Severe aortic stenosis Dysphagia - consider ENT referral if still having issues Continue Vancomycin and ceftazidime MWF with dialysis for 4 weeks total course (until 11/2). You will need weekly CBC, CMP, CRP, and predialysis vancomycin level outpatient.

## 2019-10-05 NOTE — Progress Notes (Signed)
Family Medicine Teaching Service Daily Progress Note Intern Pager: [phone]  Patient name: [name] Medical record number: [id number] Date of birth: 11-Feb-1965 Age: 54 y.o. Gender: male  Primary Care Provider: [name], MD Consultants: Nephrology, vascular surgery   Code Status: Full Code  Pt Overview and Major Events to Date:  10/1 Admitted, HD attempted (terminated early due to pain) 10/2 Inferior venacavogram (right), I&D of left thigh with graft excision, HD  Assessment and Plan: [name] is a 54 y.o. male presenting with inability to obtain access at his right femoral dialysis catheter while at HD. PMH is significant for ESRD on HD (MWF), PAD, kidney stones, spina bifida, hx of nephrostomy tube, HTN, GERD, anxiety.  Dialysis access issue Now resolved s/p inferior venacavogram, had successful HD yesterday.  Left thigh wound S/p I&D of left thigh with graft excision 10/2.  Wound VAC placed following procedure yesterday, but was removed last night because is not functioning properly.  A wet-to-dry dressing was placed, and the dressing was changed today.  Plan for wound VAC to be placed at home with home health; however, patient refuses to go home without wound VAC in place. - discussed with vascular surgery, agreeable to place wound VAC today  ESRD MWF dialysis.  Successful HD yesterday as noted above.  Hyperkalemia Resolved. At home, takes powdered Kayexalate on Saturdays and Sundays.  Normal potassium this morning 4.5.  Patient requesting Milly Jakob today as he was unable to drink Kayexalate. - Lokelma today  Dysphagia Chronic issue with throat clearing over several years, now recent worsening with notable dysphagia with food and medications over the past week.  Able to drink liquids fine.  Reports he had similar issues in his early 85s which was resolved by tongue-tie repair.  Normal oropharyngeal swallow on SLP eval.  Plan for FEES  outpatient.  GERD Worsening acid reflux more recently, especially after lying flat after eating.  On famotidine 20 mg daily at home.  Added PPI for better symptom control.  Other problems chronic and stable: Hypothyroidism Anxiety Chronic pain Insomnia Nausea  FEN/GI: Renal diet PPx: [place]  Disposition: med-surg, plan for discharge after wound VAC placed  Subjective:  Patient had successful HD yesterday.  He had a wound VAC placed in his left thigh after the procedure, which had to be removed last night because it was not functioning properly.  Patient is hesitant to go home without wound VAC in place.  Having some pain in his left thigh where he had the procedure.  No SOB.  Objective: Temp:  [98.2 F (36.8 C)-99.1 F (37.3 C)] 98.6 F (37 C) (10/03 0348) Pulse Rate:  [57-75] 58 (10/03 0348) Resp:  [12-19] 14 (10/03 0348) BP: (83-117)/(52-76) 100/56 (10/03 0902) SpO2:  [95 %-98 %] 98 % (10/03 0348) Weight:  [116.3 kg-118.9 kg] 116.3 kg (10/02 2145) Physical Exam: General: Obese male resting in bed comfortably, NAD Cardiovascular: RRR, 3/6 systolic murmur best heard at LUSB Respiratory: CTAB Abdomen: soft, mild left-sided tenderness Extremities: WWP, no edema Derm: port catheter on right without erythema, swelling, or drainage; left thigh dressing CDI  Laboratory: Recent Labs  Lab 10/03/19 0742  WBC 6.4  HGB 10.1*  HCT 34.5*  PLT 170   Recent Labs  Lab 10/03/19 0742 10/04/19 0507 10/05/19 0909  NA 138 136 137  K 5.5* 5.6* 4.5  CL 96* 97* 99  CO2 26 25 26   BUN 59* 43* 41*  CREATININE 9.34* 7.82* 7.27*  CALCIUM 9.2 8.9 9.6  GLUCOSE 90  87 111*   Left thigh aerobic/anaerobic gram stain: no WBC, rare gram positive rods  Imaging/Diagnostic Tests: No new imaging.  Zola Button, MD 10/05/2019, 11:36 AM PGY-1, [place] Intern pager: [phone], text pages welcome

## 2019-10-05 NOTE — Progress Notes (Addendum)
  Progress Note    10/05/2019 8:16 AM 1 Day Post-Op  Subjective:  No complaints   Vitals:   10/04/19 2145 10/05/19 0348  BP: (!) 92/53 (!) 83/70  Pulse: 73 (!) 58  Resp: 16 14  Temp: 98.8 F (37.1 C) 98.6 F (37 C)  SpO2: 95% 98%   Physical Exam: Lungs:  Non labored Incisions:  L thigh incision with healthy wound bed; some raw surface oozing; no purulence noted Neurologic: A&O  CBC    Component Value Date/Time   WBC 6.4 10/03/2019 0742   RBC 3.50 (L) 10/03/2019 0742   HGB 10.1 (L) 10/03/2019 0742   HCT 34.5 (L) 10/03/2019 0742   PLT 170 10/03/2019 0742   MCV 98.6 10/03/2019 0742   MCH 28.9 10/03/2019 0742   MCHC 29.3 (L) 10/03/2019 0742   RDW 17.7 (H) 10/03/2019 0742   LYMPHSABS 0.4 (L) 03/20/2019 1400   MONOABS 0.5 03/20/2019 1400   EOSABS 0.1 03/20/2019 1400   BASOSABS 0.0 03/20/2019 1400    BMET    Component Value Date/Time   NA 136 10/04/2019 0507   NA 135 (A) 01/17/2018 0000   K 5.6 (H) 10/04/2019 0507   CL 97 (L) 10/04/2019 0507   CO2 25 10/04/2019 0507   GLUCOSE 87 10/04/2019 0507   BUN 43 (H) 10/04/2019 0507   BUN 67 (A) 01/17/2018 0000   CREATININE 7.82 (H) 10/04/2019 0507   CALCIUM 8.9 10/04/2019 0507   GFRNONAA 7 (L) 10/04/2019 0507   GFRAA 8 (L) 10/04/2019 0507    INR    Component Value Date/Time   INR 1.0 12/01/2018 0501     Intake/Output Summary (Last 24 hours) at 10/05/2019 0816 Last data filed at 10/04/2019 2300 Gross per 24 hour  Intake 1477 ml  Output 2056 ml  Net -579 ml     Assessment/Plan:  54 y.o. male is s/p L thigh graft excision 1 Day Post-Op   Dressing changes this morning; continue wet to dry packing while inpatient [facility] team consulted to arrange [facility] RN to apply and maintain wound vac at home 3x/week [facility] for discharge from vascular standpoint   Dagoberto Ligas, PA-C [facility] [phone] 10/05/2019 8:16 AM  I agree with the above.  I have seen and examined the patient.  His wound VAC was  taken off last night because it was not functioning properly.  A wet-to-dry dressing was placed.  We replaced the dressing today.  The wound looks good.  Cultures were sent in the operating room yesterday.  No WBCs seen.  From a vascular perspective, he is stable for discharge.  His wound VAC can be placed at home  [facility]

## 2019-10-05 NOTE — Progress Notes (Signed)
[facility] Progress Note   Subjective:  Seen in room. HD went good yesterday, TDC seemed to run decent with BFR 315-325 range. Denies CP/dyspnea. Per notes, wound vac was not functioning last night and taken off. Plan is to discharge home once home health in place to replace the vac.  Objective Vitals:   10/04/19 1814 10/04/19 2145 10/05/19 0348 10/05/19 0902  BP: 91/76 (!) 92/53 (!) 83/70 (!) 100/56  Pulse: 75 73 (!) 58   Resp: 18 16 14    Temp: 98.2 F (36.8 C) 98.8 F (37.1 C) 98.6 F (37 C)   TempSrc:  Oral Oral   SpO2: 96% 95% 98%   Weight:  116.3 kg    Height:       Physical Exam General: Well appearing man, NAD. Room air Heart: RRR; 3/6 murmur Lungs: CTAB Back: Chronic wound to L flank from prior PCN tube - bandage falling off, mild erythema - he says this is improved Abdomen: soft, non-tender Extremities: Trace BLE edema Dialysis Access: R thigh TDC, L thigh with bandage in place.  Additional Objective Labs: Basic Metabolic Panel: Recent Labs  Lab 10/03/19 0742 10/04/19 0507  NA 138 136  K 5.5* 5.6*  CL 96* 97*  CO2 26 25  GLUCOSE 90 87  BUN 59* 43*  CREATININE 9.34* 7.82*  CALCIUM 9.2 8.9  PHOS  --  5.0*   Liver Function Tests: Recent Labs  Lab 10/04/19 0507  ALBUMIN 3.1*   CBC: Recent Labs  Lab 10/03/19 0742  WBC 6.4  HGB 10.1*  HCT 34.5*  MCV 98.6  PLT 170   Medications:  . Chlorhexidine Gluconate Cloth  6 each Topical Q0600  . [START ON 10/06/2019] doxercalciferol  3 mcg Intravenous Q M,W,F-HD  . heparin  5,000 Units Subcutaneous Q8H  . levothyroxine  137 mcg Oral QAC breakfast  . lidocaine  2 patch Transdermal Daily  . pantoprazole  40 mg Oral QAC breakfast  . sodium polystyrene  15 g Oral Once per day on Sun Sat  . sodium zirconium cyclosilicate  10 g Oral Once    [facility] Optiflux 200NRe, Time: 4hr 94min, BFR 350, DFR 800, EDW 114.5kg, 2K/2.,25Ca, TDC heparin  3700 unit bolus Mircera 250mcg IV q 2 weeks- last dose 9/27 Venofer 100mg  q HD -last dose due today Phoslo 4 caps TO TID with meals and 3 with snacks Hectorol 3 mcg IV q HD Kayexalate 15g PO on non-dialysis days   Assessment/Plan: 1. ESRD:S/p limited HD 10/1 - cath was frequent alarming, very positional - only got 3hr. S/p [facility] manipulation by Dr. Trula Slade 10/2 as well as L thigh debridement with old AVG removal. Dialyzed afterwards and TDC seemed to run decently. Next HD 10/4 per usual schedule - either here or at outpatient clinic. 2.  Hyperkalemia: Recurrent issue - pt takes kayexalate on non-dialysis today. 3. Hypertension/volume:BP low/stable. Still a bit above EDW - no dyspnea.  4. Anemia:Hgb 10.1. ESA dosed 9/27, follow trends. 5. Metabolic bone disease:Ca/Phos ok. Continue hectorol and phoslo. 6. Nutrition:Renal diet with 1.2L fluid restriction recommended 7. Pleuritic chest pain/SOB:Possibly due to excess volume, though CXR is clear. Resolved now. 8. L thigh wound/infected old AVG: S/p excision/debridement 10/2 - wound vac placed - removed d/t dysfunction. Home health being arranged to replace. 9. L flank wound: D/t prior PCN tube - some erythema, no drainage - pt reports this is improved, follow closely. 10. Dispo: Ok to discharge from renal perspective with  outpatient HD tomorrow.  [name], PA-C 10/05/2019, 9:34 AM  [employer]

## 2019-10-05 NOTE — Progress Notes (Signed)
FPTS Interim Progress Note  S:Received page from nurse concerning severe acute left knee pain and swelling. Patient notes that ever since his wound vac was placed his knee has become severely painful. He endorses paresthesia to his left foot, decreased ROM, and 10/10 pain. He denies history of gout or this ever happening before.   Received notice from nurse that patient had temp of 100.9 upon returning from X-ray. Repeat 100.0.  Nurse brought to my attention purulent discharge from piror nephrostomy tube site. Denies any worsening pain from his normal pain in that area which he rates as a 6.5/10.   Addendum: Repeat vitals notable for fever of 101.3.  O: BP (!) 104/59 (BP Location: Right Arm)   Pulse 78   Temp (!) 100.9 F (38.3 C) (Oral)   Resp 20   Ht 5\' 11"  (1.803 m)   Wt 116.3 kg   SpO2 100%   BMI 35.76 kg/m   Gen: appears in a lot of pain  Resp: breathing comfortably on room air, speaking in full sentences MSK: swollen left knee with obvious effusion and significant tenderness to palpation along entire knee joint, patient unable to passively move knee joint and only minimal active ROM achieved on exam  Extremities: peripheral pulses appreciated on left foot, equal sensation bilaterally Skin: no erythema appreciated to left knee, minimal warmth appreciated, surgical site well appearing with wound vac in place, left flank with purulent drainage from prior percutaneous nephrostomy tube site, no increased warmth or erythema    A/P: Acute Left Knee Pain: Unclear etiology. Neurovascularly intact, however significant pain on exam with appreciated effusion. No history of gout. Knee Xray notable for extensive tricompartmental degenerative changes, greatest within the lateral and patellofemoral compartments and Large suprapatellar joint effusion. - dilaudid 1mg  x1 for pain - ice/heat, compression as tolerated - Per orthos: aspirate knee and send for cultures, start Vanc/zosyn, make NPO at  MN, stop anticoagulation (heparin), consult [facility] in AM for possible washout    Fever: New onset. Multiple etiologies are possible: purulent drainage from nephrostomy site vs acutely swollen tender left knee concerning for septic joint.  - obtain blood cultures - chest x-ray - Ortho plan as above - Per urology: obtain CT stone study, cultures, and start empiric antibiotics  - plan to start empiric antibiotics   Danna Hefty, DO 10/05/2019, 11:48 PM PGY-3, [place] Service pager [phone]

## 2019-10-05 NOTE — Progress Notes (Signed)
Wet to dry dressing removed and wound vac replaced.   [name]

## 2019-10-05 NOTE — TOC Progression Note (Signed)
Transition of Care [facility]) - Progression Note    Patient Details  Name: [name] MRN: [id number] Date of Birth: 09-19-1965  Transition of Care [facility]) CM/SW Contact  Sharin Mons, RN Phone Number: 10/05/2019, 4:51 PM  Clinical Narrative:    NCM f/u with [facility]/ Tracie by voice message and text to learn status of wound vac approval and  delivery. Awaiting response....   Expected Discharge Plan: [place] Barriers to Discharge: Continued Medical Work up (Awaiting wound vac)  Expected Discharge Plan and Services Expected Discharge Plan: [place]   Discharge Planning Services: CM Consult Post Acute Care Choice: Home Health, Durable Medical Equipment                   DME Arranged: Vac DME Agency: [facility] Date DME Agency Contacted: 10/04/19 Time DME Agency Contacted: 0930 Representative spoke with at DME Agency: [facility] HH Arranged: RN, Nurse's Aide [place] Agency: [facility] Date [place]: 10/04/19 Time [place]: [id number] Representative spoke with at [place]: [place] ([place]) Interventions    Readmission Risk Interventions No flowsheet data found.

## 2019-10-06 ENCOUNTER — Inpatient Hospital Stay (HOSPITAL_COMMUNITY): Payer: Medicare Other

## 2019-10-06 DIAGNOSIS — M25562 Pain in left knee: Secondary | ICD-10-CM

## 2019-10-06 DIAGNOSIS — M25462 Effusion, left knee: Secondary | ICD-10-CM

## 2019-10-06 DIAGNOSIS — T829XXA Unspecified complication of cardiac and vascular prosthetic device, implant and graft, initial encounter: Secondary | ICD-10-CM | POA: Diagnosis not present

## 2019-10-06 LAB — RENAL FUNCTION PANEL
Albumin: 3.4 g/dL — ABNORMAL LOW (ref 3.5–5.0)
Anion gap: 9 (ref 5–15)
BUN: 56 mg/dL — ABNORMAL HIGH (ref 6–20)
CO2: 29 mmol/L (ref 22–32)
Calcium: 9.5 mg/dL (ref 8.9–10.3)
Chloride: 95 mmol/L — ABNORMAL LOW (ref 98–111)
Creatinine, Ser: 8.73 mg/dL — ABNORMAL HIGH (ref 0.61–1.24)
GFR calc Af Amer: 7 mL/min — ABNORMAL LOW (ref >60)
GFR calc non Af Amer: 6 mL/min — ABNORMAL LOW (ref >60)
Glucose, Bld: 127 mg/dL — ABNORMAL HIGH (ref 70–99)
Phosphorus: 4 mg/dL (ref 2.5–4.6)
Potassium: 5.6 mmol/L — ABNORMAL HIGH (ref 3.5–5.1)
Sodium: 133 mmol/L — ABNORMAL LOW (ref 135–145)

## 2019-10-06 LAB — CBC WITH DIFFERENTIAL/PLATELET
Abs Immature Granulocytes: 0.02 10*3/uL (ref 0.00–0.07)
Basophils Absolute: 0 10*3/uL (ref 0.0–0.1)
Basophils Relative: 0 %
Eosinophils Absolute: 0 10*3/uL (ref 0.0–0.5)
Eosinophils Relative: 0 %
HCT: 30.8 % — ABNORMAL LOW (ref 39.0–52.0)
Hemoglobin: 8.9 g/dL — ABNORMAL LOW (ref 13.0–17.0)
Immature Granulocytes: 0 %
Lymphocytes Relative: 3 %
Lymphs Abs: 0.3 10*3/uL — ABNORMAL LOW (ref 0.7–4.0)
MCH: 28.3 pg (ref 26.0–34.0)
MCHC: 28.9 g/dL — ABNORMAL LOW (ref 30.0–36.0)
MCV: 98.1 fL (ref 80.0–100.0)
Monocytes Absolute: 0.9 10*3/uL (ref 0.1–1.0)
Monocytes Relative: 9 %
Neutro Abs: 8.5 10*3/uL — ABNORMAL HIGH (ref 1.7–7.7)
Neutrophils Relative %: 88 %
Platelets: 159 10*3/uL (ref 150–400)
RBC: 3.14 MIL/uL — ABNORMAL LOW (ref 4.22–5.81)
RDW: 17.9 % — ABNORMAL HIGH (ref 11.5–15.5)
WBC: 9.8 10*3/uL (ref 4.0–10.5)
nRBC: 0 % (ref 0.0–0.2)

## 2019-10-06 LAB — SYNOVIAL CELL COUNT + DIFF, W/ CRYSTALS
Crystals, Fluid: NONE SEEN
Lymphocytes-Synovial Fld: 0 % (ref 0–20)
Monocyte-Macrophage-Synovial Fluid: 9 % — ABNORMAL LOW (ref 50–90)
Neutrophil, Synovial: 91 % — ABNORMAL HIGH (ref 0–25)
WBC, Synovial: 25000 /mm3 — ABNORMAL HIGH (ref 0–200)

## 2019-10-06 MED ORDER — ALTEPLASE 2 MG IJ SOLR
6.0000 mg | Freq: Once | INTRAMUSCULAR | Status: AC
  Administered 2019-10-06: 6 mg

## 2019-10-06 MED ORDER — LIDOCAINE HCL (PF) 2 % IJ SOLN
0.0000 mL | Freq: Once | INTRAMUSCULAR | Status: AC | PRN
  Administered 2019-10-06: 20 mL via INTRADERMAL
  Filled 2019-10-06 (×2): qty 20

## 2019-10-06 MED ORDER — HYDROMORPHONE HCL 1 MG/ML IJ SOLN
INTRAMUSCULAR | Status: AC
Start: 2019-10-06 — End: 2019-10-06
  Administered 2019-10-06: 1 mg
  Filled 2019-10-06: qty 1

## 2019-10-06 MED ORDER — HEPARIN SODIUM (PORCINE) 1000 UNIT/ML IJ SOLN
INTRAMUSCULAR | Status: AC
  Filled 2019-10-06: qty 6

## 2019-10-06 MED ORDER — HYDROMORPHONE HCL 1 MG/ML IJ SOLN
1.0000 mg | Freq: Once | INTRAMUSCULAR | Status: AC
  Administered 2019-10-06: 1 mg via INTRAVENOUS
  Filled 2019-10-06: qty 1

## 2019-10-06 MED ORDER — VANCOMYCIN HCL 2000 MG/400ML IV SOLN
2000.0000 mg | Freq: Once | INTRAVENOUS | Status: AC
  Administered 2019-10-06: 2000 mg via INTRAVENOUS
  Filled 2019-10-06: qty 400

## 2019-10-06 MED ORDER — PROMETHAZINE HCL 25 MG/ML IJ SOLN: 12.5000 mg | Freq: Once | INTRAMUSCULAR | Status: DC

## 2019-10-06 MED ORDER — ALTEPLASE 2 MG IJ SOLR
INTRAMUSCULAR | Status: AC
  Filled 2019-10-06: qty 2

## 2019-10-06 MED ORDER — ALTEPLASE 2 MG IJ SOLR
INTRAMUSCULAR | Status: AC
  Filled 2019-10-06: qty 4

## 2019-10-06 MED ORDER — HEPARIN SODIUM (PORCINE) 1000 UNIT/ML DIALYSIS: 3500.0000 [IU] | Freq: Once | INTRAMUSCULAR | Status: DC

## 2019-10-06 MED ORDER — TRAZODONE HCL 100 MG PO TABS
100.0000 mg | ORAL_TABLET | Freq: Every evening | ORAL | Status: DC | PRN
  Administered 2019-10-07 – 2019-10-08 (×3): 100 mg via ORAL
  Filled 2019-10-06 (×3): qty 1

## 2019-10-06 MED ORDER — ALTEPLASE 2 MG IJ SOLR: 2.0000 mg | Freq: Once | INTRAMUSCULAR | Status: DC

## 2019-10-06 MED ORDER — PIPERACILLIN-TAZOBACTAM IN DEX 2-0.25 GM/50ML IV SOLN
2.2500 g | Freq: Once | INTRAVENOUS | Status: AC
  Administered 2019-10-06: 2.25 g via INTRAVENOUS
  Filled 2019-10-06: qty 50

## 2019-10-06 MED ORDER — DOXERCALCIFEROL 4 MCG/2ML IV SOLN
INTRAVENOUS | Status: AC
  Administered 2019-10-06: 3 ug via INTRAVENOUS
  Filled 2019-10-06: qty 2

## 2019-10-06 MED ORDER — HEPARIN SODIUM (PORCINE) 1000 UNIT/ML IJ SOLN
INTRAMUSCULAR | Status: AC
  Administered 2019-10-06: 1000 [IU]
  Filled 2019-10-06: qty 4

## 2019-10-06 MED ORDER — VANCOMYCIN HCL IN DEXTROSE 1-5 GM/200ML-% IV SOLN
1000.0000 mg | INTRAVENOUS | Status: DC
  Administered 2019-10-17: 1000 mg via INTRAVENOUS
  Filled 2019-10-06: qty 200

## 2019-10-06 MED ORDER — PROMETHAZINE HCL 25 MG/ML IJ SOLN
INTRAMUSCULAR | Status: AC
  Administered 2019-10-06: 12.5 mg
  Filled 2019-10-06: qty 1

## 2019-10-06 MED ORDER — ONDANSETRON HCL 4 MG/2ML IJ SOLN: 4.0000 mg | Freq: Once | INTRAMUSCULAR | Status: DC

## 2019-10-06 MED ORDER — VANCOMYCIN HCL IN DEXTROSE 1-5 GM/200ML-% IV SOLN
INTRAVENOUS | Status: AC
  Administered 2019-10-06: 1000 mg via INTRAVENOUS
  Filled 2019-10-06: qty 200

## 2019-10-06 MED ORDER — ONDANSETRON HCL 4 MG/2ML IJ SOLN
INTRAMUSCULAR | Status: AC
  Administered 2019-10-06: 4 mg
  Filled 2019-10-06: qty 2

## 2019-10-06 MED ORDER — ACETAMINOPHEN 325 MG PO TABS
650.0000 mg | ORAL_TABLET | Freq: Four times a day (QID) | ORAL | Status: DC | PRN
  Administered 2019-10-06: 650 mg via ORAL
  Filled 2019-10-06: qty 2

## 2019-10-06 MED ORDER — PIPERACILLIN-TAZOBACTAM IN DEX 2-0.25 GM/50ML IV SOLN
2.2500 g | Freq: Three times a day (TID) | INTRAVENOUS | Status: DC
  Administered 2019-10-06 – 2019-10-08 (×5): 2.25 g via INTRAVENOUS
  Filled 2019-10-06 (×9): qty 50

## 2019-10-06 MED ORDER — HYDROMORPHONE HCL 1 MG/ML IJ SOLN
0.5000 mg | INTRAMUSCULAR | Status: DC | PRN
  Administered 2019-10-06 – 2019-10-09 (×9): 0.5 mg via INTRAVENOUS
  Filled 2019-10-06 (×11): qty 1

## 2019-10-06 NOTE — Progress Notes (Signed)
Called [facility] regarding pt's consult regarding consult placed earlier today. Secretary confirmed that the consult was placed earlier this afternoon. Paged the one call provider again regarding consult for possible septic joint.   Lyndee Hensen, DO PGY-2, [place] [facility] 10/06/2019 6:59 PM

## 2019-10-06 NOTE — Progress Notes (Signed)
  Speech Language Pathology  Patient Details Name: [name] MRN: [id number] DOB: 1965/09/10 Today's Date: 10/06/2019 Time: 1503-     Unable to observe pt with food/liquid. Ortho to come see per attending and npo in case he needs procedure. Will follow. From description of symptoms and initial BSE, suspect related to GER.               [place] Siren 10/06/2019, 3:21 PM  Orbie Pyo Colvin Caroli.Ed Risk analyst [phone] Office [phone]

## 2019-10-06 NOTE — Progress Notes (Signed)
  Speech Language Pathology  Patient Details Name: [name] MRN: [id number] DOB: 05-Mar-1965 Today's Date: 10/06/2019 Time:  -     Pt needs an instrumental swallow assessment. He is currently in dialysis. Per previous SLP note's he was refusing a modified barium swallow. Therapist plans to speak with pt after HD if able about options (MBS, FEES) to assess swallow function.                               [place] Siren 10/06/2019, 8:22 AM   Orbie Pyo Colvin Caroli.Ed Risk analyst [phone] Office [phone]

## 2019-10-06 NOTE — Progress Notes (Signed)
Family Medicine Teaching Service Daily Progress Note Intern Pager: [phone]  Patient name: [name] Medical record number: [id number] Date of birth: 05-26-1965 Age: 54 y.o. Gender: male  Primary Care Provider: [name], MD Consultants: Nephrology, vascular surgery, orthopedics   Code Status: Full Code  Pt Overview and Major Events to Date:  10/1 Admitted, HD attempted (terminated early due to pain) 10/2 Inferior venacavogram (right), I&D of left thigh with graft excision, HD 10/4 Joint aspiration left knee  Assessment and Plan: [name] is a 54 y.o. male presenting with inability to obtain access at his right femoral dialysis catheter while at HD, now with acute knee pain and swelling with fever concerning for septic arthritis. PMH is significant for ESRD on HD (MWF), PAD, kidney stones, spina bifida, hx of nephrostomy tube, HTN, GERD, anxiety.  Dialysis access issue Now resolved s/p inferior venacavogram.  Left thigh wound S/p I&D of left thigh with graft excision 10/2. Wound VAC placed again by vascular surgery yesterday, but was changed to wet-to-dry dressing last night because the wound VAC was apparently not working properly.  Acute left knee pain Last night began to have acute left knee pain and swelling.  Patient believes it is due to an issue caused by the wound VAC.  He underwent joint aspiration of his left knee with a significant amount of blood aspirated.  Synovial fluid with 40X WBC (neutrophilic predominance), though no organisms seen on Gram stain.  No crystals seen on synovial fluid.  Cultures pending.  Due to concern for septic arthritis given acute knee pain and fever, orthopedic surgery was consulted and will evaluate the patient today.  Also spoke with vascular surgery, will evaluate later today but they do not think it is related to the left thigh I&D site.  No significant knee swelling on exam this morning. - s/p joint aspiration - f/u cultures -  f/u orthopedic recs, vascular surgery recs  Fever New onset fever last night up to 101.3 F.  Unclear etiology though septic arthritis possible given acute onset of left knee pain and swelling.  Patient did note some drainage from his left flank wound from prior nephrostomy tube site, but he states this has improved.  No drainage seen on exam this morning.  CXR without acute abnormality.  Broad-spectrum antibiotics started overnight. - piperacillin-tazobactam (10/4-) - vancomycin MWF with HD (10/4) - f/u blood cultures - f/u synovial fluid cultures - f/u left thigh wound cultures  ESRD MWF dialysis, continue while inpatient per nephrology.  Dysphagia Chronic, worse over past week. - SLP, may get FEES  Aortic stenosis Severe AS noted on echo this admission.  Likely contributing to his symptoms of chest pain and shortness of breath. - outpatient follow-up (sees Dr. Angelena Form)  Other problems chronic and stable:  GERD Hypothyroidism Anxiety Chronic pain Insomnia Nausea  FEN/GI: NPO for possible procedure PPx: SCDs  Disposition: med-surg  Subjective:  Patient was seen this morning in dialysis.  He is still reporting a lot of pain in his left knee.  He states the swelling improved after the aspiration and still feels a little bit of swelling.  States he is not having much chest pain.  Objective: Temp:  [98 F (36.7 C)-101.3 F (38.5 C)] 98 F (36.7 C) (10/04 0745) Pulse Rate:  [69-78] 71 (10/04 0745) Resp:  [10-20] 10 (10/04 0912) BP: (96-118)/(52-67) 96/54 (10/04 0912) SpO2:  [96 %-100 %] 99 % (10/04 0745) Weight:  [735 kg] 118 kg (10/04 0740) Physical Exam: General: Obese  male resting in dialysis bed comfortably, NAD Cardiovascular: RRR, 3/6 systolic murmur best heard at LUSB Respiratory: CTAB Abdomen: soft, mild left-sided tenderness Extremities: WWP, lower extremity ROM limited by pain, minimal left knee joint effusion with patellar ballotment, mildly tender to  palpation to medial and lateral joint lines of left knee, no significant warmth or erythema Neuro: able to wiggle toes Derm: port catheter on right without erythema, swelling, or drainage; left thigh dressing CDI; left flank wound from old nephrostomy site with mild erythema but no drainage  Laboratory: Recent Labs  Lab 10/03/19 0742 10/06/19 0152  WBC 6.4 9.8  HGB 10.1* 8.9*  HCT 34.5* 30.8*  PLT 170 159   Recent Labs  Lab 10/04/19 0507 10/05/19 0909 10/06/19 0152  NA 136 137 133*  K 5.6* 4.5 5.6*  CL 97* 99 95*  CO2 25 26 29   BUN 43* 41* 56*  CREATININE 7.82* 7.27* 8.73*  CALCIUM 8.9 9.6 9.5  GLUCOSE 87 111* 127*    Imaging/Diagnostic Tests: DG CHEST PORT 1 VIEW  Result Date: 10/06/2019 CLINICAL DATA:  Fever EXAM: PORTABLE CHEST 1 VIEW COMPARISON:  Radiograph from 3 days ago.  Chest CT from yesterday. FINDINGS: Interstitial prominence. No consolidation, Kerley lines, effusion, or pneumothorax. Normal heart size and mediastinal contours. Prominent osteopenia IMPRESSION: Stable low volume chest.  No focal pneumonia. Electronically Signed   By: Monte Fantasia M.D.   On: 10/06/2019 06:17   DG Knee Complete 4 Views Left  Result Date: 10/05/2019 CLINICAL DATA:  Pain EXAM: LEFT KNEE - COMPLETE 4+ VIEW COMPARISON:  None. FINDINGS: There are extensive tricompartmental degenerative changes, greatest within the lateral and patellofemoral compartments. There are large subchondral cystic changes throughout the patella. There is a large suprapatellar joint effusion. There is soft tissue swelling about the knee. There is osteopenia which limits detection of nondisplaced fractures. There is no acute displaced fracture or dislocation. Advanced vascular calcifications are noted. IMPRESSION: 1. No acute displaced fracture or dislocation. 2. Extensive tricompartmental degenerative changes, greatest within the lateral and patellofemoral compartments. 3. Large suprapatellar joint effusion. 4.  Osteopenia. Electronically Signed   By: Constance Holster M.D.   On: 10/05/2019 22:43   CT RENAL STONE STUDY  Result Date: 10/06/2019 CLINICAL DATA:  Left lower quadrant pain. Leg drainage from prior nephrostomy tube site. New fever. EXAM: CT ABDOMEN AND PELVIS WITHOUT CONTRAST TECHNIQUE: Multidetector CT imaging of the abdomen and pelvis was performed following the standard protocol without IV contrast. COMPARISON:  CT dated March 20, 2019 FINDINGS: Lower chest: The lung bases are clear. The heart size is normal. Hepatobiliary: The liver is normal. Normal gallbladder.There is no biliary ductal dilation. Pancreas: Normal contours without ductal dilatation. No peripancreatic fluid collection. Spleen: The spleen is significantly enlarged measuring approximately 15 cm craniocaudad. Adrenals/Urinary Tract: --Adrenal glands: Unremarkable. --Right kidney/ureter: The right kidney is atrophic with multiple nonobstructing stones. --Left kidney/ureter: The left kidney is atrophic. The previously demonstrated for ostomy tube has been removed. There is a tract from the left kidney to the patient's skin surface on the left flank. This may represent a draining sinus tract. Chronic inflammatory changes are noted in the left retroperitoneum about the left kidney. --Urinary bladder: The urinary bladder is decompressed and therefore is poorly evaluated. Stomach/Bowel: --Stomach/Duodenum: No hiatal hernia or other gastric abnormality. Normal duodenal course and caliber. --Small bowel: There is a right lower quadrant ileostomy. --Colon: There is a large amount of stool in the colon, especially at the level of the rectum. --Appendix: Normal.  Vascular/Lymphatic: There is a right femoral approach tunneled dialysis catheter with tip terminating at the level of the suprarenal IVC. There is a left common iliac stent in place. The infrarenal IVC is atretic and may be chronically occluded as evidence by multiple collateral veins coursing  through the patient's anterior abdomen. Atherosclerotic changes are noted throughout the abdominal aorta without evidence for an aneurysm. A left femoral dialysis graft is again noted and likely chronically occluded. --No retroperitoneal lymphadenopathy. --No mesenteric lymphadenopathy. --No pelvic or inguinal lymphadenopathy. Reproductive: Unremarkable Other: No ascites or free air. There is mild body wall edema. Musculoskeletal. There is renal osteodystrophy. Chronic cystic changes are noted of the bilateral femoral heads. IMPRESSION: 1. Status post removal of the previously demonstrated left-sided nephrostomy tube. There is a sinus tract coursing from the left kidney to the patient's left flank which may explain the patient's symptoms. There is no well-formed drainable fluid collection. 2. Multiple additional chronic findings are noted as detailed above. Aortic Atherosclerosis (ICD10-I70.0). Electronically Signed   By: Constance Holster M.D.   On: 10/06/2019 01:47    Zola Button, MD 10/06/2019, 9:48 AM PGY-1, [place] Intern pager: [phone], text pages welcome

## 2019-10-06 NOTE — Procedures (Addendum)
Aspiration/Injection Procedure Note [name] [id number] 10/21/1965  Procedure: Aspiration Indications: Acute left knee swelling, pain, decreased ROM, fever  Procedure Details Consent: Risks of procedure as well as the alternatives and risks of each were explained to the (patient/caregiver).  Consent for procedure obtained. Time Out: Verified patient identification, verified procedure, lateral aspect of knee was marked, verified correct patient position, special equipment/implants available, medications/allergies/relevent history reviewed, required imaging and test results available.  Performed   By physical exam an effusion was located, and using landmarks the knee was positioned accordingly. The site was prepped with alcohol and sterilely draped. Using sterile technique the site was anesthetized with 20 mLs of 1% lidocaine. The joint space was entered with an 18 gauge and fluid from joint space was aspirated via lateral approach. A clean dressing was applied.  Local Anesthesia Used:Lidocaine 1% plain; 63mL Amount of Fluid Aspirated: 18mL Character of Fluid: bloody Fluid was sent JZP:HXTA count, culture, crystal identification and gram stain A sterile dressing was applied.  Patient did tolerate procedure well. Estimated blood loss: <16mL  Collin Rengel P Anu Stagner 10/06/2019, 3:01 AM

## 2019-10-06 NOTE — Progress Notes (Signed)
[facility]  Subjective  - States his knee is his biggest problem currently.  He states he has not had trauma to the left knee, he did sprain it many years ago, but no other notable injuries.   Objective (!) 107/52 78 100 F (37.8 C) (Oral) 16 96%  Intake/Output Summary (Last 24 hours) at 10/06/2019 0744 Last data filed at 10/06/2019 0457 Gross per 24 hour  Intake 1357.83 ml  Output 0 ml  Net 1357.83 ml    Left thigh wound covered wet to dry Left knee swollen with painful motion.  No erythema.  Decreased movement secondary to pain.   Currently on HD.  Assessment/Planning: POD # 2  Procedure:   #1: Inferior venacavogram                         #2: I&D of left thigh including resection of multiple old dialysis grafts and overlying skin.                         #3: Placement of wound VAC to left thigh (7 x 5 x 2 cm) Special Requests LEFT THIGH SPEC   Gram Stain NO WBC SEEN  RARE GRAM POSITIVE RODS   Culture CULTURE REINCUBATED FOR BETTER GROWTH     Left knee effusion post bloody aspiration pending cultures. Specimen Description FLUID SYNOVIAL LEFT KNEE   Special Requests NONE   Gram Stain MANY WBC PRESENT, PREDOMINANTLY PMN  NO ORGANISMS SEEN    ON IV Zosyn and Vanc for broad coverage Tm 100 Left thigh graft rare gram positive.  We are not sure if the left knee effusion is connected to the thigh graft removal.  Pending final cultures cont. IV antibiotics. Wet to dry dressing changes      Roxy Horseman 10/06/2019 7:44 AM --  Laboratory Lab Results: Recent Labs    10/06/19 0152  WBC 9.8  HGB 8.9*  HCT 30.8*  PLT 159   BMET Recent Labs    10/05/19 0909 10/06/19 0152  NA 137 133*  K 4.5 5.6*  CL 99 95*  CO2 26 29  GLUCOSE 111* 127*  BUN 41* 56*  CREATININE 7.27* 8.73*  CALCIUM 9.6 9.5    COAG Lab Results  Component Value Date   INR 1.0 12/01/2018   INR 1.2 11/26/2018   INR 1.14 12/13/2017   No results  found for: PTT

## 2019-10-06 NOTE — Progress Notes (Signed)
[facility] Progress Note   Subjective:   Seen in HD unit. Catheter working well this am.  Spiked fever overnight and blood cultures drawn. Continued left knee pain this am s/p joint aspiration. Wound cultures from left thigh pending.   Objective Vitals:   10/05/19 2017 10/05/19 2242 10/06/19 0012 10/06/19 0315  BP: (!) 104/59   (!) 107/52  Pulse: 78   78  Resp: 20   16  Temp: 99.5 F (37.5 C) (!) 100.9 F (38.3 C) (!) 101.3 F (38.5 C) 100 F (37.8 C)  TempSrc: Oral Oral Oral Oral  SpO2: 100%   96%  Weight:      Height:       Physical Exam General: WNWD male, lying in bed, nad  Heart: RRR; 3/6 murmur Lungs: Clear bilaterally  Back: Chronic wound to L flank from prior PCN tube mild erythema - he says this is improved Abdomen: soft, non-tender Extremities: Trace BLE edema Dialysis Access: R thigh TDC, L thigh with bandage in place.  Additional Objective Labs: Basic Metabolic Panel: Recent Labs  Lab 10/04/19 0507 10/05/19 0909 10/06/19 0152  NA 136 137 133*  K 5.6* 4.5 5.6*  CL 97* 99 95*  CO2 25 26 29   GLUCOSE 87 111* 127*  BUN 43* 41* 56*  CREATININE 7.82* 7.27* 8.73*  CALCIUM 8.9 9.6 9.5  PHOS 5.0* 4.4 4.0   Liver Function Tests: Recent Labs  Lab 10/04/19 0507 10/05/19 0909 10/06/19 0152  ALBUMIN 3.1* 3.3* 3.4*   CBC: Recent Labs  Lab 10/03/19 0742 10/06/19 0152  WBC 6.4 9.8  NEUTROABS  --  8.5*  HGB 10.1* 8.9*  HCT 34.5* 30.8*  MCV 98.6 98.1  PLT 170 159   Medications: . piperacillin-tazobactam (ZOSYN)  IV    . vancomycin     . Chlorhexidine Gluconate Cloth  6 each Topical Q0600  . doxercalciferol  3 mcg Intravenous Q M,W,F-HD  . famotidine  20 mg Oral Daily  .  HYDROmorphone (DILAUDID) injection  1 mg Intravenous Once  . levothyroxine  137 mcg Oral QAC breakfast  . lidocaine  2 patch Transdermal Daily  . ondansetron (ZOFRAN) IV  4 mg Intravenous Once  . pantoprazole  40 mg Oral QAC breakfast  . sodium polystyrene  15  g Oral Once per day on Sun Sat    [facility] Optiflux 200NRe, Time: 4hr 36min, BFR 350, DFR 800, EDW 114.5kg, 2K/2.,25Ca, TDC heparin 3700 unit bolus Mircera 276mcg IV q 2 weeks- last dose 9/27 Venofer 100mg  q HD -last dose due today Phoslo 4 caps TO TID with meals and 3 with snacks Hectorol 3 mcg IV q HD Kayexalate 15g PO on non-dialysis days   Assessment/Plan: 1. ESRD:Last HD 10/2. S/p [facility] manipulation by Dr. Trula Slade 10/2 as well as L thigh debridement with old AVG removal. Dialyzed afterwards and TDC seems to be running decently. HD today on schedule.  2.  Hyperkalemia: Recurrent issue - pt takes kayexalate on non-dialysis today. 3. Fever on 10/4. Blood cultures drawn. Possible sources of infection include: dialysis catheter, left thigh wound, knee effusion --cultures pending. IV antibiotics per primary team  4. L thigh wound/infected old AVG: S/p excision/debridement 10/2 - wound vac placed - removed d/t dysfunction. Wound cultures pending.  5. L knee pain/effusion - s/p joint aspiration 10/4. Cultures pending.  6. Hypertension/volume:BP low/stable. Still a bit above EDW - no dyspnea.  7. Anemia:Hgb 8.9. ESA dosed 9/27, follow trends. 8. Metabolic bone  disease:Ca/Phos ok. Continue hectorol and phoslo. 9. Nutrition:Renal diet with 1.2L fluid restriction recommended 10. Pleuritic chest pain/SOB:Possibly due to excess volume, though CXR is clear. Resolved now. 11. L flank wound: D/t prior PCN tube - some erythema, no drainage - pt reports this is improved, follow closely. 12. Dispo: Pending   [facility] 10/06/2019,8:16 AM

## 2019-10-06 NOTE — Progress Notes (Addendum)
Pharmacy Antibiotic Note  [name] is a 54 y.o. male admitted on 10/03/2019, now with fevers.  Pharmacy has been consulted for vancomycin and Zosyn dosing.  Plan: Vancomycin 2000mg  x1 then 1000mg  IV every HD.  Goal trough 15-20 mcg/mL. Zosyn 2.25g IV every 8 hours.  Height: 5\' 11"  (180.3 cm) Weight: 116.3 kg (256 lb 6.3 oz) IBW/kg (Calculated) : 75.3  Temp (24hrs), Avg:99.7 F (37.6 C), Min:98.2 F (36.8 C), Max:101.3 F (38.5 C)  Recent Labs  Lab 10/03/19 0742 10/04/19 0507 10/05/19 0909  WBC 6.4  --   --   CREATININE 9.34* 7.82* 7.27*    Estimated Creatinine Clearance: 15.2 mL/min (A) (by C-G formula based on SCr of 7.27 mg/dL (H)).    Allergies  Allergen Reactions  . Hydrocodone Other (See Comments)    Caused involuntary movement and twitching. CANNOT TAKE DUE TO MUSCLE SPASMS AND MUSCLE TREMORS  . Furadantin [Nitrofurantoin] Other (See Comments)    UNSPECIFIED REACTION   . Mandelamine [Methenamine] Other (See Comments)    UNSPECIFIED REACTION   . Noroxin [Norfloxacin] Other (See Comments)    UNSPECIFIED REACTION   . Sulfa Antibiotics Other (See Comments) and Cough    Childhood reaction - pt could not confirm that it was a cough  . Sulfur Cough    Childhood reaction - pt could not confirm that it was a cough     Thank you for allowing pharmacy to be a part of this patient's care.  [name], PharmD, BCPS  10/06/2019 2:17 AM

## 2019-10-06 NOTE — Progress Notes (Signed)
[facility]PTS Interim Progress Note  Spoke to [facility] regarding acute left knee swelling and bloody aspiration from left knee. Per [facility], unlikely I&D from left medial thigh would be causing pathology in his left knee, however [facility] will come see patient to evaluate.   Danna Hefty, DO 10/06/2019, 7:33 AM PGY-3, [facility] pager [phone]

## 2019-10-06 NOTE — Consult Note (Signed)
ORTHOPAEDIC CONSULTATION  REQUESTING PHYSICIAN: Leeanne Rio, MD  Chief Complaint: "My left knee hurts"  HPI: Michaell Grider is a 54 y.o. male who presents with left knee pain and swelling. He notes onset of pain around last Friday with pain worsening and associated fevers.  He has history of ESRD on HD. Had aspiration of knee last night with 25,000 WBC and 91% neutrophils.  Cultures pending.  Patient denies any history of pain or surgery with this left knee previously.  Denies any complaint of right knee pain.    Past Medical History:  Diagnosis Date  . Anemia   . Anxiety   . Constipation   . End stage renal failure on dialysis [facility])    [place]; MWF, on HD since 1997, exhausted upper ext access, and possibly LE access; cath dependent in R groin as of 06/2018  . GERD (gastroesophageal reflux disease)   . Grand mal seizure ([place]) X 4   last 1998 (02/29/2016)  . Heart murmur    no problems with per pt. - nephrologist and Dr. Coletta Memos  . History of blood transfusion 2000s   "I was having blood loss; never found out from where"  . History of kidney stones   . Hypertension    hx of - has not taken bp meds in over 2 years  since dialysis  . Hypothyroidism   . Insomnia   . Migraine    "due to my BP in my late 1990s; none since" (02/29/2016)  . Nonhealing surgical wound    of the left arteriovenous graft  . Pneumonia    x 2  . Spina bifida [facility])    Past Surgical History:  Procedure Laterality Date  . AMPUTATION Left 12/11/2017   Procedure: Left Great Toe Amputation;  Surgeon: [name], MD;  Location: [place];  Service: Orthopedics;  Laterality: Left;  . APPENDECTOMY    . APPLICATION OF WOUND VAC Left 10/04/2019   Procedure: APPLICATION OF WOUND VAC;  Surgeon: Serafina Mitchell, MD;  Location: [place];  Service: Vascular;  Laterality: Left;  . ARTERIOVENOUS GRAFT PLACEMENT Left 10/16/2012   left femoral goretex graft         Dr Donnetta Hutching  . AV FISTULA PLACEMENT Left 06/27/2012    Procedure: EXPLORATORY LEFT THY-GRAFT PSEUDO-ANEURYSM;  Surgeon: Conrad , MD;  Location: [place];  Service: Vascular;  Laterality: Left;  Revision of left Arteriovenus gortex graft in thigh.  Bertram Savin REMOVAL Left 10/04/2019   Procedure: REMOVAL OF ARTERIOVENOUS GORETEX GRAFT ([place]);  Surgeon: Serafina Mitchell, MD;  Location: [facility] OR;  Service: Vascular;  Laterality: Left;  . COLONOSCOPY    . FLEXIBLE SIGMOIDOSCOPY N/A 12/03/2018   Procedure: FLEXIBLE SIGMOIDOSCOPY;  Surgeon: Yetta Flock, MD;  Location: [place];  Service: Gastroenterology;  Laterality: N/A;  . I & D EXTREMITY Left 02/29/2016   Procedure: DEBRIDEMENT LEFT THIGH WOUND;  Surgeon: Waynetta Sandy, MD;  Location: [place];  Service: Vascular;  Laterality: Left;  . I & D EXTREMITY Left 10/04/2019   Procedure: IRRIGATION AND DEBRIDEMENT LEFT THIGH;  Surgeon: Serafina Mitchell, MD;  Location: [place];  Service: Vascular;  Laterality: Left;  . ILEOSTOMY  1970s  . INCISION AND DRAINAGE Left 10/16/2012   Procedure: INCISION AND Debridement left thigh graft;  Surgeon: Rosetta Posner, MD;  Location: [place];  Service: Vascular;  Laterality: Left;  . INSERTION OF DIALYSIS CATHETER    . INSERTION OF DIALYSIS CATHETER  01/14/2016  Procedure: INSERTION OF DIALYSIS CATHETER;  Surgeon: Waynetta Sandy, MD;  Location: [place];  Service: Vascular;;  . INSERTION OF DIALYSIS CATHETER Right 08/07/2017   Procedure: INSERTION OF DIALYSIS CATHETER;  Surgeon: Waynetta Sandy, MD;  Location: [place];  Service: Vascular;  Laterality: Right;  . INSERTION OF DIALYSIS CATHETER Right 12/21/2017   Procedure: RIGHT FEMORAL DIALYSIS CATHETER EXCHANGE;  Surgeon: Marty Heck, MD;  Location: [place];  Service: Vascular;  Laterality: Right;  . INSERTION OF ILIAC STENT Left 01/14/2016   Procedure: INSERTION OF ILIAC STENT;  Surgeon: Waynetta Sandy, MD;  Location: [place];  Service: Vascular;  Laterality: Left;  . INTRAOPERATIVE  ARTERIOGRAM Left 01/14/2016   Procedure: INTRA OPERATIVE ARTERIOGRAM;  Surgeon: Waynetta Sandy, MD;  Location: [place];  Service: Vascular;  Laterality: Left;  . IR NEPHROSTOMY EXCHANGE LEFT  03/21/2019  . IR NEPHROSTOMY PLACEMENT LEFT  11/28/2018  . LITHOTRIPSY  x 3   "& a laser treatment" (02/29/2016)  . NEPHROLITHOTOMY Left 03/26/2019   Procedure: NEPHROLITHOTOMY PERCUTANEOUS;  Surgeon: Alexis Frock, MD;  Location: [facility];  Service: Urology;  Laterality: Left;  2 HRS  . NEPHROSTOMY Bilateral 1968  . PATELLAR TENDON REPAIR Right 1990s   "big incision"  . PERIPHERAL VASCULAR CATHETERIZATION  01/14/2016   Procedure: A/V SHUNTOGRAM;  Surgeon: Waynetta Sandy, MD;  Location: [place];  Service: Vascular;;  . REVISION OF ARTERIOVENOUS GORETEX GRAFT Left 10/16/2012   Procedure: REVISION OF LEFT FEMORAL LOOP ARTERIOVENOUS GORETEX GRAFT;  Surgeon: Rosetta Posner, MD;  Location: [place];  Service: Vascular;  Laterality: Left;  . REVISION OF ARTERIOVENOUS GORETEX GRAFT Left 02/11/2015   Procedure: EXCISION OF SMALL SEGMENT OF EXPOSED LEFT THIGH NON FUNCTIONING  ARTERIOVENOUS GORETEX GRAFT;  Surgeon: Mal Misty, MD;  Location: [place];  Service: Vascular;  Laterality: Left;  . REVISION OF ARTERIOVENOUS GORETEX GRAFT Left 01/14/2016   Procedure: REVISION OF ARTERIOVENOUS GORETEX GRAFT;  Surgeon: Waynetta Sandy, MD;  Location: [place];  Service: Vascular;  Laterality: Left;  . REVISION OF ARTERIOVENOUS GORETEX GRAFT Left 02/29/2016   thigh/pt report  . REVISION OF ARTERIOVENOUS GORETEX GRAFT Left 02/29/2016   Procedure: POSSIBLE REVISION OF LEFT THIGH ARTERIOVENOUS GORETEX GRAFT;  Surgeon: Waynetta Sandy, MD;  Location: [place];  Service: Vascular;  Laterality: Left;  . TEE WITHOUT CARDIOVERSION N/A 12/14/2017   Procedure: TRANSESOPHAGEAL ECHOCARDIOGRAM (TEE);  Surgeon: Acie Fredrickson, Wonda Cheng, MD;  Location: [place];  Service: Cardiovascular;  Laterality: N/A;  . THROMBECTOMY AND  REVISION OF ARTERIOVENTOUS (AV) GORETEX  GRAFT Left 06/12/2018   Procedure: Removal of infected left thigh graft;  Surgeon: Rosetta Posner, MD;  Location: [place];  Service: Vascular;  Laterality: Left;  . THROMBECTOMY W/ EMBOLECTOMY Left 01/14/2016   Procedure: THROMBECTOMY ARTERIOVENOUS GORE-TEX Left thigh GRAFT;  Surgeon: Waynetta Sandy, MD;  Location: [place];  Service: Vascular;  Laterality: Left;  . TONGUE SURGERY  ~ 1990   tongue-tie release   . ULTRASOUND GUIDANCE FOR VASCULAR ACCESS Right 08/07/2017   Procedure: ULTRASOUND GUIDANCE FOR VASCULAR CANNULATION RIGHT FEMORAL VEIN AND LEFT AV FEMORAL GRAFT;  Surgeon: Waynetta Sandy, MD;  Location: [place];  Service: Vascular;  Laterality: Right;  . UPPER EXTREMITY VENOGRAPHY Bilateral 08/09/2017   Procedure: UPPER EXTREMITY VENOGRAPHY;  Surgeon: Serafina Mitchell, MD;  Location: [place] [facility];  Service: Cardiovascular;  Laterality: Bilateral;  . VENOGRAM N/A 09/20/2017   Procedure: RIGHT COMMON FEMORAL ARTERY EXPLORATION.  CANNULATION RIGHT COMMON  FEMORAL VEIN. VENOGRAM CENTRAL ULTRA SOUND GUIDED RIGHT FEMORAL VEIN TIMES TWO.;  Surgeon: Waynetta Sandy, MD;  Location: [facility];  Service: Vascular;  Laterality: N/A;  . VENOGRAM Right 10/04/2019   Procedure: VENOGRAM;  Surgeon: Serafina Mitchell, MD;  Location: [place];  Service: Vascular;  Laterality: Right;  . WOUND DEBRIDEMENT Left 02/29/2016   thigh   Social History   Socioeconomic History  . Marital status: Single    Spouse name: Not on file  . Number of children: 0  . Years of education: Not on file  . Highest education level: Not on file  Occupational History  . Occupation: Disabled  Tobacco Use  . Smoking status: Never Smoker  . Smokeless tobacco: Never Used  Vaping Use  . Vaping Use: Never used  Substance and Sexual Activity  . Alcohol use: Not Currently    Alcohol/week: 0.0 standard drinks    Comment: 02/29/2016 "nothing since  the mid 1990s  . Drug use:  Not Currently    Types: Marijuana    Comment: 02/29/2016 "nothing since ~ 1995"  . Sexual activity: Yes  Other Topics Concern  . Not on file  Social History Narrative  . Not on file   Social Determinants of Health   Financial Resource Strain:   . Difficulty of Paying Living Expenses: Not on file  Food Insecurity:   . Worried About Charity fundraiser in the Last Year: Not on file  . Ran Out of Food in the Last Year: Not on file  Transportation Needs:   . Lack of Transportation (Medical): Not on file  . Lack of Transportation (Non-Medical): Not on file  Physical Activity:   . Days of Exercise per Week: Not on file  . Minutes of Exercise per Session: Not on file  Stress:   . Feeling of Stress : Not on file  Social Connections:   . Frequency of Communication with Friends and Family: Not on file  . Frequency of Social Gatherings with Friends and Family: Not on file  . Attends Religious Services: Not on file  . Active Member of Clubs or Organizations: Not on file  . Attends Archivist Meetings: Not on file  . Marital Status: Not on file   Family History  Problem Relation Age of Onset  . Diabetes Mother   . Hypertension Mother   . Heart disease Mother        before age 57  . Diabetes Father   . Heart attack Father        X's 3  . Diabetes Sister   . Bipolar disorder Sister    - negative except otherwise stated in the family history section Allergies  Allergen Reactions  . Hydrocodone Other (See Comments)    Caused involuntary movement and twitching. CANNOT TAKE DUE TO MUSCLE SPASMS AND MUSCLE TREMORS  . Furadantin [Nitrofurantoin] Other (See Comments)    UNSPECIFIED REACTION   . Mandelamine [Methenamine] Other (See Comments)    UNSPECIFIED REACTION   . Noroxin [Norfloxacin] Other (See Comments)    UNSPECIFIED REACTION   . Sulfa Antibiotics Other (See Comments) and Cough    Childhood reaction - pt could not confirm that it was a cough  . Sulfur Cough     Childhood reaction - pt could not confirm that it was a cough   Prior to Admission medications   Medication Sig Start Date End Date Taking? Authorizing Provider  aspirin 325 MG tablet Take 325 mg by mouth  daily.   Yes [provider]  calcium acetate (PHOSLO) 667 MG capsule Take 3-4 capsules (2,001-2,668 mg total) by mouth See admin instructions. Take 2708 mg by mouth 3 times daily with meals, take 2001 mg by mouth with snacks Patient taking differently: Take 2,001-2,668 mg by mouth See admin instructions. Take 2,668 mg by mouth three times a day with meals and 2,001 mg with optional snacks 01/14/18  Yes Hennie Duos, MD  clopidogrel (PLAVIX) 75 MG tablet Take 75 mg by mouth daily.   Yes [provider]  famotidine (PEPCID) 20 MG tablet Take 1 tablet (20 mg total) by mouth daily. 03/25/19  Yes Gifford Shave, MD  fluticasone [facility]) 50 MCG/ACT nasal spray Place 2 sprays into both nostrils as needed for allergies or rhinitis (congestion). Patient taking differently: Place 2 sprays into both nostrils as needed for allergies or rhinitis (or congestion).  01/14/18  Yes Hennie Duos, MD  furosemide (LASIX) 80 MG tablet Take 80 mg by mouth 2 (two) times daily as needed (fluid).   Yes [provider]  levothyroxine (SYNTHROID) 137 MCG tablet Take 137 mcg by mouth daily before breakfast.   Yes [provider]  LORazepam (ATIVAN) 1 MG tablet Take 1 mg by mouth daily as needed (anxiety).    Yes [provider]  Nutritional Supplements (NEPRO) LIQD Take 237 mLs by mouth daily. Mixed Berry   Yes [provider]  oxyCODONE-acetaminophen (PERCOCET) 10-325 MG tablet Take 1 tablet by mouth every 6 (six) hours as needed for pain. 06/14/18  Yes Dagoberto Ligas, PA-C  promethazine (PHENERGAN) 25 MG tablet Take 1 tablet (25 mg total) by mouth 2 (two) times daily as needed for nausea or vomiting. Patient taking differently: Take 25 mg by mouth every 8  (eight) hours as needed for nausea or vomiting.  01/14/18  Yes Hennie Duos, MD  sodium polystyrene (KAYEXALATE) powder Take 15 g by mouth See admin instructions. Take 15 g (mixed in water) by mouth on Saturdays and Sundays 01/14/18  Yes Hennie Duos, MD  traZODone (DESYREL) 100 MG tablet Take 1 tablet (100 mg total) by mouth at bedtime. 01/14/18  Yes Hennie Duos, MD  zolpidem (AMBIEN) 10 MG tablet Take 1 tablet (10 mg total) by mouth at bedtime. 01/14/18  Yes Hennie Duos, MD  levothyroxine (SYNTHROID, LEVOTHROID) 125 MCG tablet Take 1 tablet (125 mcg total) by mouth daily before breakfast. Patient not taking: Reported on 10/03/2019 01/14/18   Hennie Duos, MD   CT ANGIO CHEST PE W OR WO CONTRAST  Result Date: 10/05/2019 CLINICAL DATA:  PE suspected, chest pain for a week EXAM: CT ANGIOGRAPHY CHEST WITH CONTRAST TECHNIQUE: Multidetector CT imaging of the chest was performed using the standard protocol during bolus administration of intravenous contrast. Multiplanar CT image reconstructions and MIPs were obtained to evaluate the vascular anatomy. CONTRAST:  31mL OMNIPAQUE IOHEXOL 350 MG/ML SOLN COMPARISON:  None. FINDINGS: Cardiovascular: Satisfactory opacification of the pulmonary arteries to the segmental level. No evidence of pulmonary embolism. Normal heart size. Three-vessel coronary artery calcifications. No pericardial effusion. Aortic valve calcifications. Scattered aortic atherosclerosis. The brachiocephalic veins and superior vena cava are very narrow, with some evidence of internal calcification. There is extensive vascular collateralization about the chest wall. Mediastinum/Nodes: No enlarged mediastinal, hilar, or axillary lymph nodes. Thyroid gland, trachea, and esophagus demonstrate no significant findings. Lungs/Pleura: Lungs are clear. No pleural effusion or pneumothorax. Upper Abdomen: No acute abnormality. Musculoskeletal: No chest wall abnormality.  Renal  osteodystrophy. Review of the MIP images confirms the above findings. IMPRESSION: 1. Negative examination for pulmonary embolism. 2. The brachiocephalic veins and superior vena cava are very narrow, with some evidence of internal calcification. There is extensive vascular collateralization about the chest wall. 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 3. Coronary artery disease. 4. Aortic valve calcifications. Aortic Atherosclerosis (ICD10-I70.0). 5. Renal osteodystrophy. Electronically Signed   By: Eddie Candle M.D.   On: 10/05/2019 15:36   DG CHEST PORT 1 VIEW  Result Date: 10/06/2019 CLINICAL DATA:  Fever EXAM: PORTABLE CHEST 1 VIEW COMPARISON:  Radiograph from 3 days ago.  Chest CT from yesterday. FINDINGS: Interstitial prominence. No consolidation, Kerley lines, effusion, or pneumothorax. Normal heart size and mediastinal contours. Prominent osteopenia IMPRESSION: Stable low volume chest.  No focal pneumonia. Electronically Signed   By: Monte Fantasia M.D.   On: 10/06/2019 06:17   DG Knee Complete 4 Views Left  Result Date: 10/05/2019 CLINICAL DATA:  Pain EXAM: LEFT KNEE - COMPLETE 4+ VIEW COMPARISON:  None. FINDINGS: There are extensive tricompartmental degenerative changes, greatest within the lateral and patellofemoral compartments. There are large subchondral cystic changes throughout the patella. There is a large suprapatellar joint effusion. There is soft tissue swelling about the knee. There is osteopenia which limits detection of nondisplaced fractures. There is no acute displaced fracture or dislocation. Advanced vascular calcifications are noted. IMPRESSION: 1. No acute displaced fracture or dislocation. 2. Extensive tricompartmental degenerative changes, greatest within the lateral and patellofemoral compartments. 3. Large suprapatellar joint effusion. 4. Osteopenia. Electronically Signed   By: Constance Holster M.D.   On:  10/05/2019 22:43   ECHOCARDIOGRAM COMPLETE  Result Date: 10/05/2019    ECHOCARDIOGRAM REPORT   Patient Name:   AVERI CACIOPPO Date of Exam: 10/05/2019 Medical Rec #:  [id number]        Height:       71.0 in Accession #:    [id number]       Weight:       256.4 lb Date of Birth:  1965/10/04       BSA:          2.343 m Patient Age:    89 years         BP:           126/81 mmHg Patient Gender: M                HR:           67 bpm. Exam Location:  Inpatient Procedure: 2D Echo, Color Doppler and Cardiac Doppler Indications:    R01.1 Murmur  History:        Patient has prior history of Echocardiogram examinations, most                 recent 12/14/2017.  Sonographer:    Merrie Roof RDCS Referring Phys: [place]  1. Left ventricular ejection fraction, by estimation, is 65 to 70%. The left ventricle has normal function. The left ventricle has no regional wall motion abnormalities. There is mild left ventricular hypertrophy. Left ventricular diastolic parameters are consistent with Grade II diastolic dysfunction (pseudonormalization).  2. Right ventricular systolic function is normal. The right ventricular size is normal. Tricuspid regurgitation signal is inadequate for assessing PA pressure.  3. Left atrial size was moderately dilated.  4. The mitral valve is abnormal. Trivial mitral valve regurgitation. Moderate mitral annular calcification.  5. The aortic  valve is functionally bicuspid. There is moderate calcification of the aortic valve and evidence of severe stenosis. Aortic valve mean gradient measures 37.2 mmHg. Aortic valve Vmax measures 3.88 m/s. Dimentionless index 0.25  6. The inferior vena cava is normal in size with greater than 50% respiratory variability, suggesting right atrial pressure of 3 mmHg. FINDINGS  Left Ventricle: Left ventricular ejection fraction, by estimation, is 65 to 70%. The left ventricle has normal function. The left ventricle has no regional wall motion  abnormalities. The left ventricular internal cavity size was normal in size. There is  mild left ventricular hypertrophy. Left ventricular diastolic parameters are consistent with Grade II diastolic dysfunction (pseudonormalization). Right Ventricle: The right ventricular size is normal. No increase in right ventricular wall thickness. Right ventricular systolic function is normal. Tricuspid regurgitation signal is inadequate for assessing PA pressure. Left Atrium: Left atrial size was moderately dilated. Right Atrium: Right atrial size was normal in size. Pericardium: There is no evidence of pericardial effusion. Mitral Valve: The mitral valve is abnormal. There is mild calcification of the mitral valve leaflet(s). Moderate mitral annular calcification. Trivial mitral valve regurgitation. Tricuspid Valve: The tricuspid valve is grossly normal. Tricuspid valve regurgitation is trivial. Aortic Valve: The aortic valve is bicuspid. There is moderate calcification of the aortic valve. There is mild to moderate aortic valve annular calcification. Aortic valve regurgitation is not visualized. Severe aortic stenosis is present. Aortic valve mean gradient measures 37.2 mmHg. Aortic valve peak gradient measures 60.1 mmHg. Aortic valve area, by VTI measures 0.77 cm. Pulmonic Valve: The pulmonic valve was grossly normal. Pulmonic valve regurgitation is trivial. Aorta: The aortic root is normal in size and structure. Venous: The inferior vena cava is normal in size with greater than 50% respiratory variability, suggesting right atrial pressure of 3 mmHg. IAS/Shunts: No atrial level shunt detected by color flow Doppler.  LEFT VENTRICLE PLAX 2D LVIDd:         5.00 cm     Diastology LVIDs:         3.60 cm     LV e' medial:    4.47 cm/s LV PW:         1.20 cm     LV E/e' medial:  30.9 LV IVS:        1.20 cm     LV e' lateral:   6.20 cm/s LVOT diam:     2.00 cm     LV E/e' lateral: 22.3 LV SV:         65 LV SV Index:   28 LVOT  Area:     3.14 cm  LV Volumes (MOD) LV vol d, MOD A4C: 76.5 ml LV vol s, MOD A4C: 34.2 ml LV SV MOD A4C:     76.5 ml RIGHT VENTRICLE RV Basal diam:  3.30 cm LEFT ATRIUM              Index       RIGHT ATRIUM           Index LA diam:        5.20 cm  2.22 cm/m  RA Area:     15.50 cm LA Vol (A2C):   117.0 ml 49.93 ml/m RA Volume:   35.70 ml  15.23 ml/m LA Vol (A4C):   109.0 ml 46.51 ml/m LA Biplane Vol: 118.0 ml 50.36 ml/m  AORTIC VALVE AV Area (Vmax):    0.94 cm AV Area (Vmean):   0.89 cm AV Area (VTI):  0.77 cm AV Vmax:           387.75 cm/s AV Vmean:          290.500 cm/s AV VTI:            0.844 m AV Peak Grad:      60.1 mmHg AV Mean Grad:      37.2 mmHg LVOT Vmax:         116.00 cm/s LVOT Vmean:        82.100 cm/s LVOT VTI:          0.207 m LVOT/AV VTI ratio: 0.25  AORTA Ao Root diam: 3.50 cm Ao Asc diam:  3.40 cm MITRAL VALVE MV Area (PHT): 2.17 cm     SHUNTS MV Decel Time: 349 msec     Systemic VTI:  0.21 m MV E velocity: 138.00 cm/s  Systemic Diam: 2.00 cm MV A velocity: 104.00 cm/s MV E/A ratio:  1.33 Rozann Lesches MD Electronically signed by Rozann Lesches MD Signature Date/Time: 10/05/2019/3:14:51 PM    Final    CT RENAL STONE STUDY  Result Date: 10/06/2019 CLINICAL DATA:  Left lower quadrant pain. Leg drainage from prior nephrostomy tube site. New fever. EXAM: CT ABDOMEN AND PELVIS WITHOUT CONTRAST TECHNIQUE: Multidetector CT imaging of the abdomen and pelvis was performed following the standard protocol without IV contrast. COMPARISON:  CT dated March 20, 2019 FINDINGS: Lower chest: The lung bases are clear. The heart size is normal. Hepatobiliary: The liver is normal. Normal gallbladder.There is no biliary ductal dilation. Pancreas: Normal contours without ductal dilatation. No peripancreatic fluid collection. Spleen: The spleen is significantly enlarged measuring approximately 15 cm craniocaudad. Adrenals/Urinary Tract: --Adrenal glands: Unremarkable. --Right kidney/ureter: The right  kidney is atrophic with multiple nonobstructing stones. --Left kidney/ureter: The left kidney is atrophic. The previously demonstrated for ostomy tube has been removed. There is a tract from the left kidney to the patient's skin surface on the left flank. This may represent a draining sinus tract. Chronic inflammatory changes are noted in the left retroperitoneum about the left kidney. --Urinary bladder: The urinary bladder is decompressed and therefore is poorly evaluated. Stomach/Bowel: --Stomach/Duodenum: No hiatal hernia or other gastric abnormality. Normal duodenal course and caliber. --Small bowel: There is a right lower quadrant ileostomy. --Colon: There is a large amount of stool in the colon, especially at the level of the rectum. --Appendix: Normal. Vascular/Lymphatic: There is a right femoral approach tunneled dialysis catheter with tip terminating at the level of the suprarenal IVC. There is a left common iliac stent in place. The infrarenal IVC is atretic and may be chronically occluded as evidence by multiple collateral veins coursing through the patient's anterior abdomen. Atherosclerotic changes are noted throughout the abdominal aorta without evidence for an aneurysm. A left femoral dialysis graft is again noted and likely chronically occluded. --No retroperitoneal lymphadenopathy. --No mesenteric lymphadenopathy. --No pelvic or inguinal lymphadenopathy. Reproductive: Unremarkable Other: No ascites or free air. There is mild body wall edema. Musculoskeletal. There is renal osteodystrophy. Chronic cystic changes are noted of the bilateral femoral heads. IMPRESSION: 1. Status post removal of the previously demonstrated left-sided nephrostomy tube. There is a sinus tract coursing from the left kidney to the patient's left flank which may explain the patient's symptoms. There is no well-formed drainable fluid collection. 2. Multiple additional chronic findings are noted as detailed above. Aortic  Atherosclerosis (ICD10-I70.0). Electronically Signed   By: Constance Holster M.D.   On: 10/06/2019 01:47   - pertinent xrays, CT,  MRI studies were reviewed and independently interpreted  Positive ROS: All other systems have been reviewed and were otherwise negative with the exception of those mentioned in the HPI and as above.  Physical Exam: General: Alert, no acute distress Psychiatric: Patient is competent for consent with normal mood and affect Lymphatic: No axillary or cervical lymphadenopathy Cardiovascular: No pedal edema Respiratory: No cyanosis, no use of accessory musculature GI: No organomegaly, abdomen is soft and non-tender    Images:  @ENCIMAGES @  Labs:  Lab Results  Component Value Date   ESRSEDRATE 59 01/17/2018   ESRSEDRATE 103 01/10/2018   REPTSTATUS PENDING 10/06/2019   GRAMSTAIN  10/06/2019    MANY WBC PRESENT, PREDOMINANTLY PMN NO ORGANISMS SEEN Performed at [facility] [address], [place], [place] [address]    CULT PENDING 10/06/2019   [facility] STAPHYLOCOCCUS AUREUS 12/07/2017    Lab Results  Component Value Date   ALBUMIN 3.4 (L) 10/06/2019   ALBUMIN 3.3 (L) 10/05/2019   ALBUMIN 3.1 (L) 10/04/2019    Neurologic: Patient does not have protective sensation bilateral lower extremities.   MUSCULOSKELETAL:   Ortho exam demonstrates left knee with very large effusion that is warm to the touch and significantly warmer than the contralateral knee.  No significant knee effusion of the contralateral knee.  Tenderness diffusely throughout the left knee with severe pain when the knee is brought into full extension or flexed past about 60 degrees.  Mild to moderate pain between these extremes of range of motion.  No tenderness in the bilateral calves.  Negative Homans' sign.  No draining sinus noted.  No significant redness throughout the left knee.  Assessment: Left knee septic arthritis  Plan: Discussed patient's history/physical exam as  well as lab results with Dr. Alphonzo Severance.  Cell count is not severely high but he does have a high percentage of neutrophils as well as a clinical presentation of septic arthritis.  With these findings, plan to proceed with arthroscopic left knee irrigation and drainage with placement of antibiotic beads tomorrow afternoon.  Discussed this with the patient as well as the risks and benefits of the procedure.  He agrees with this plan. NPO after midnight tonight.  Hold blood thinners.   Thank you for the consult and the opportunity to see Mr. Whitt, Auletta [facility] [phone] 7:02 PM

## 2019-10-07 ENCOUNTER — Ambulatory Visit: Payer: Medicare Other

## 2019-10-07 ENCOUNTER — Inpatient Hospital Stay (HOSPITAL_COMMUNITY): Payer: Medicare Other | Admitting: Certified Registered"

## 2019-10-07 ENCOUNTER — Encounter (HOSPITAL_COMMUNITY): Payer: Self-pay | Admitting: Family Medicine

## 2019-10-07 ENCOUNTER — Encounter (HOSPITAL_COMMUNITY)
Admission: EM | Disposition: A | Discharge status: Home Health | Payer: Self-pay | Source: Ambulatory Visit | Attending: Family Medicine

## 2019-10-07 DIAGNOSIS — M009 Pyogenic arthritis, unspecified: Secondary | ICD-10-CM

## 2019-10-07 HISTORY — PX: KNEE ARTHROSCOPY: SHX127

## 2019-10-07 HISTORY — PX: DRESSING CHANGE UNDER ANESTHESIA: SHX5237

## 2019-10-07 LAB — RENAL FUNCTION PANEL
Albumin: 2.8 g/dL — ABNORMAL LOW (ref 3.5–5.0)
Anion gap: 13 (ref 5–15)
BUN: 42 mg/dL — ABNORMAL HIGH (ref 6–20)
CO2: 25 mmol/L (ref 22–32)
Calcium: 9.1 mg/dL (ref 8.9–10.3)
Chloride: 95 mmol/L — ABNORMAL LOW (ref 98–111)
Creatinine, Ser: 6.68 mg/dL — ABNORMAL HIGH (ref 0.61–1.24)
GFR calc Af Amer: 10 mL/min — ABNORMAL LOW (ref >60)
GFR calc non Af Amer: 9 mL/min — ABNORMAL LOW (ref >60)
Glucose, Bld: 115 mg/dL — ABNORMAL HIGH (ref 70–99)
Phosphorus: 5.1 mg/dL — ABNORMAL HIGH (ref 2.5–4.6)
Potassium: 5.1 mmol/L (ref 3.5–5.1)
Sodium: 133 mmol/L — ABNORMAL LOW (ref 135–145)

## 2019-10-07 LAB — SURGICAL PCR SCREEN
MRSA, PCR: NEGATIVE
Staphylococcus aureus: NEGATIVE

## 2019-10-07 LAB — CBC
HCT: 28.4 % — ABNORMAL LOW (ref 39.0–52.0)
Hemoglobin: 8.2 g/dL — ABNORMAL LOW (ref 13.0–17.0)
MCH: 28.5 pg (ref 26.0–34.0)
MCHC: 28.9 g/dL — ABNORMAL LOW (ref 30.0–36.0)
MCV: 98.6 fL (ref 80.0–100.0)
Platelets: 132 10*3/uL — ABNORMAL LOW (ref 150–400)
RBC: 2.88 MIL/uL — ABNORMAL LOW (ref 4.22–5.81)
RDW: 17.7 % — ABNORMAL HIGH (ref 11.5–15.5)
WBC: 5.4 10*3/uL (ref 4.0–10.5)
nRBC: 0 % (ref 0.0–0.2)

## 2019-10-07 SURGERY — ARTHROSCOPY, KNEE
Anesthesia: General | Site: Knee | Laterality: Left

## 2019-10-07 MED ORDER — PROMETHAZINE HCL 25 MG/ML IJ SOLN
INTRAMUSCULAR | Status: AC
  Filled 2019-10-07: qty 1

## 2019-10-07 MED ORDER — ONDANSETRON HCL 4 MG/2ML IJ SOLN
INTRAMUSCULAR | Status: AC
  Administered 2019-10-07: 4 mg via INTRAVENOUS
  Filled 2019-10-07: qty 2

## 2019-10-07 MED ORDER — MORPHINE SULFATE 4 MG/ML IJ SOLN
INTRAMUSCULAR | Status: DC | PRN
Start: 2019-10-07 — End: 2019-10-07
  Administered 2019-10-07: 4 mg via INTRAVENOUS

## 2019-10-07 MED ORDER — VANCOMYCIN HCL 500 MG IV SOLR
INTRAVENOUS | Status: DC | PRN
  Administered 2019-10-07: 500 mg

## 2019-10-07 MED ORDER — METHOCARBAMOL 1000 MG/10ML IJ SOLN
500.0000 mg | Freq: Four times a day (QID) | INTRAVENOUS | Status: DC | PRN
  Filled 2019-10-07: qty 5

## 2019-10-07 MED ORDER — CHLORHEXIDINE GLUCONATE 0.12 % MT SOLN: 15.0000 mL | Freq: Once | OROMUCOSAL | Status: AC

## 2019-10-07 MED ORDER — VANCOMYCIN HCL 500 MG IV SOLR
INTRAVENOUS | Status: AC
  Filled 2019-10-07: qty 500

## 2019-10-07 MED ORDER — FENTANYL CITRATE (PF) 100 MCG/2ML IJ SOLN
INTRAMUSCULAR | Status: DC | PRN
Start: 2019-10-07 — End: 2019-10-07
  Administered 2019-10-07: 50 ug via INTRAVENOUS
  Administered 2019-10-07: 25 ug via INTRAVENOUS
  Administered 2019-10-07: 50 ug via INTRAVENOUS
  Administered 2019-10-07: 25 ug via INTRAVENOUS
  Administered 2019-10-07: 50 ug via INTRAVENOUS
  Administered 2019-10-07 (×2): 25 ug via INTRAVENOUS

## 2019-10-07 MED ORDER — GENTAMICIN SULFATE 40 MG/ML IJ SOLN
INTRAMUSCULAR | Status: DC | PRN
  Administered 2019-10-07: 120 mg

## 2019-10-07 MED ORDER — CLONIDINE HCL (ANALGESIA) 100 MCG/ML EP SOLN
EPIDURAL | Status: AC
  Filled 2019-10-07: qty 10

## 2019-10-07 MED ORDER — PROPOFOL 10 MG/ML IV BOLUS
INTRAVENOUS | Status: DC | PRN
  Administered 2019-10-07: 100 mg via INTRAVENOUS

## 2019-10-07 MED ORDER — AMISULPRIDE (ANTIEMETIC) 5 MG/2ML IV SOLN
10.0000 mg | Freq: Once | INTRAVENOUS | Status: AC | PRN
  Administered 2019-10-07: 10 mg via INTRAVENOUS
  Filled 2019-10-07 (×2): qty 4

## 2019-10-07 MED ORDER — EPHEDRINE SULFATE-NACL 50-0.9 MG/10ML-% IV SOSY
PREFILLED_SYRINGE | INTRAVENOUS | Status: DC | PRN
  Administered 2019-10-07 (×2): 10 mg via INTRAVENOUS
  Administered 2019-10-07: 5 mg via INTRAVENOUS
  Administered 2019-10-07: 10 mg via INTRAVENOUS

## 2019-10-07 MED ORDER — LIDOCAINE 2% (20 MG/ML) 5 ML SYRINGE
INTRAMUSCULAR | Status: AC
  Filled 2019-10-07: qty 5

## 2019-10-07 MED ORDER — FENTANYL CITRATE (PF) 250 MCG/5ML IJ SOLN
INTRAMUSCULAR | Status: AC
  Filled 2019-10-07: qty 5

## 2019-10-07 MED ORDER — MIDAZOLAM HCL 5 MG/5ML IJ SOLN
INTRAMUSCULAR | Status: DC | PRN
  Administered 2019-10-07: 2 mg via INTRAVENOUS

## 2019-10-07 MED ORDER — 0.9 % SODIUM CHLORIDE (POUR BTL) OPTIME
TOPICAL | Status: DC | PRN
  Administered 2019-10-07: 1000 mL

## 2019-10-07 MED ORDER — OXYCODONE HCL 5 MG PO TABS: 5.0000 mg | ORAL_TABLET | Freq: Once | ORAL | Status: DC | PRN

## 2019-10-07 MED ORDER — CHLORHEXIDINE GLUCONATE 0.12 % MT SOLN
OROMUCOSAL | Status: AC
  Administered 2019-10-07: 15 mL via OROMUCOSAL
  Filled 2019-10-07: qty 15

## 2019-10-07 MED ORDER — MIDAZOLAM HCL 2 MG/2ML IJ SOLN
INTRAMUSCULAR | Status: AC
  Filled 2019-10-07: qty 2

## 2019-10-07 MED ORDER — GENTAMICIN SULFATE 40 MG/ML IJ SOLN
INTRAMUSCULAR | Status: AC
  Filled 2019-10-07: qty 2

## 2019-10-07 MED ORDER — ALBUMIN HUMAN 5 % IV SOLN: INTRAVENOUS | Status: DC | PRN

## 2019-10-07 MED ORDER — VASOPRESSIN 20 UNIT/ML IV SOLN
INTRAVENOUS | Status: AC
  Filled 2019-10-07: qty 1

## 2019-10-07 MED ORDER — HYDROMORPHONE HCL 1 MG/ML IJ SOLN: 0.2500 mg | INTRAMUSCULAR | Status: DC | PRN

## 2019-10-07 MED ORDER — METOCLOPRAMIDE HCL 5 MG/ML IJ SOLN: 5.0000 mg | Freq: Three times a day (TID) | INTRAMUSCULAR | Status: DC | PRN

## 2019-10-07 MED ORDER — ONDANSETRON HCL 4 MG/2ML IJ SOLN: 4.0000 mg | Freq: Once | INTRAMUSCULAR | Status: AC

## 2019-10-07 MED ORDER — ONDANSETRON HCL 4 MG/2ML IJ SOLN: 4.0000 mg | Freq: Four times a day (QID) | INTRAMUSCULAR | Status: DC | PRN

## 2019-10-07 MED ORDER — VASOPRESSIN 20 UNIT/ML IV SOLN
INTRAVENOUS | Status: DC | PRN
  Administered 2019-10-07: 1 [IU] via INTRAVENOUS

## 2019-10-07 MED ORDER — BUPIVACAINE HCL (PF) 0.25 % IJ SOLN
INTRAMUSCULAR | Status: AC
  Filled 2019-10-07: qty 30

## 2019-10-07 MED ORDER — LIDOCAINE 2% (20 MG/ML) 5 ML SYRINGE
INTRAMUSCULAR | Status: DC | PRN
  Administered 2019-10-07: 80 mg via INTRAVENOUS

## 2019-10-07 MED ORDER — VANCOMYCIN HCL 1000 MG IV SOLR
INTRAVENOUS | Status: AC
  Filled 2019-10-07: qty 1000

## 2019-10-07 MED ORDER — MORPHINE SULFATE (PF) 4 MG/ML IV SOLN
INTRAVENOUS | Status: AC
  Filled 2019-10-07: qty 2

## 2019-10-07 MED ORDER — AMISULPRIDE (ANTIEMETIC) 5 MG/2ML IV SOLN
INTRAVENOUS | Status: AC
  Filled 2019-10-07: qty 4

## 2019-10-07 MED ORDER — OXYCODONE HCL 5 MG/5ML PO SOLN: 5.0000 mg | Freq: Once | ORAL | Status: DC | PRN

## 2019-10-07 MED ORDER — SODIUM CHLORIDE 0.9 % IR SOLN
Status: DC | PRN
  Administered 2019-10-07 (×3): 3000 mL

## 2019-10-07 MED ORDER — BUPIVACAINE-EPINEPHRINE (PF) 0.25% -1:200000 IJ SOLN
INTRAMUSCULAR | Status: AC
  Filled 2019-10-07: qty 30

## 2019-10-07 MED ORDER — METHOCARBAMOL 500 MG PO TABS
500.0000 mg | ORAL_TABLET | Freq: Four times a day (QID) | ORAL | Status: DC | PRN
  Administered 2019-10-07: 500 mg via ORAL
  Filled 2019-10-07: qty 1

## 2019-10-07 MED ORDER — METOCLOPRAMIDE HCL 5 MG PO TABS: 5.0000 mg | ORAL_TABLET | Freq: Three times a day (TID) | ORAL | Status: DC | PRN

## 2019-10-07 MED ORDER — CLONIDINE HCL (ANALGESIA) 100 MCG/ML EP SOLN
EPIDURAL | Status: DC | PRN
  Administered 2019-10-07: 100 ug

## 2019-10-07 MED ORDER — DOCUSATE SODIUM 100 MG PO CAPS
100.0000 mg | ORAL_CAPSULE | Freq: Two times a day (BID) | ORAL | Status: DC
  Administered 2019-10-07 – 2019-10-09 (×3): 100 mg via ORAL
  Filled 2019-10-07 (×6): qty 1

## 2019-10-07 MED ORDER — PROMETHAZINE HCL 25 MG/ML IJ SOLN
6.2500 mg | INTRAMUSCULAR | Status: DC | PRN
  Administered 2019-10-07: 6.25 mg via INTRAVENOUS

## 2019-10-07 MED ORDER — FENTANYL CITRATE (PF) 100 MCG/2ML IJ SOLN
INTRAMUSCULAR | Status: AC
Start: 2019-10-07 — End: 2019-10-07
  Administered 2019-10-07: 50 ug via INTRAVENOUS
  Filled 2019-10-07: qty 2

## 2019-10-07 MED ORDER — PROPOFOL 10 MG/ML IV BOLUS
INTRAVENOUS | Status: AC
  Filled 2019-10-07: qty 20

## 2019-10-07 MED ORDER — FENTANYL CITRATE (PF) 100 MCG/2ML IJ SOLN: 50.0000 ug | Freq: Once | INTRAMUSCULAR | Status: AC

## 2019-10-07 MED ORDER — BUPIVACAINE-EPINEPHRINE 0.25% -1:200000 IJ SOLN
INTRAMUSCULAR | Status: DC | PRN
  Administered 2019-10-07: 30 mg

## 2019-10-07 MED ORDER — ONDANSETRON HCL 4 MG PO TABS: 4.0000 mg | ORAL_TABLET | Freq: Four times a day (QID) | ORAL | Status: DC | PRN

## 2019-10-07 MED ORDER — HEPARIN SODIUM (PORCINE) 5000 UNIT/ML IJ SOLN
5000.0000 [IU] | Freq: Three times a day (TID) | INTRAMUSCULAR | Status: DC
  Administered 2019-10-07 – 2019-10-18 (×28): 5000 [IU] via SUBCUTANEOUS
  Filled 2019-10-07 (×29): qty 1

## 2019-10-07 MED ORDER — PHENYLEPHRINE HCL-NACL 10-0.9 MG/250ML-% IV SOLN
INTRAVENOUS | Status: DC | PRN
  Administered 2019-10-07: 40 ug/min via INTRAVENOUS

## 2019-10-07 MED ORDER — ONDANSETRON HCL 4 MG/2ML IJ SOLN
INTRAMUSCULAR | Status: DC | PRN
  Administered 2019-10-07: 4 mg via INTRAVENOUS

## 2019-10-07 MED ORDER — CHLORHEXIDINE GLUCONATE 4 % EX LIQD
60.0000 mL | Freq: Once | CUTANEOUS | Status: AC
  Administered 2019-10-07: 4 via TOPICAL
  Filled 2019-10-07: qty 60

## 2019-10-07 MED ORDER — PHENYLEPHRINE 40 MCG/ML (10ML) SYRINGE FOR IV PUSH (FOR BLOOD PRESSURE SUPPORT)
PREFILLED_SYRINGE | INTRAVENOUS | Status: DC | PRN
  Administered 2019-10-07: 80 ug via INTRAVENOUS
  Administered 2019-10-07: 120 ug via INTRAVENOUS
  Administered 2019-10-07: 80 ug via INTRAVENOUS

## 2019-10-07 MED ORDER — DEXAMETHASONE SODIUM PHOSPHATE 10 MG/ML IJ SOLN
INTRAMUSCULAR | Status: DC | PRN
  Administered 2019-10-07: 10 mg via INTRAVENOUS

## 2019-10-07 MED ORDER — SODIUM CHLORIDE 0.9 % IV SOLN: INTRAVENOUS | Status: DC | PRN

## 2019-10-07 SURGICAL SUPPLY — 54 items
BANDAGE ESMARK 6X9 LF (GAUZE/BANDAGES/DRESSINGS) IMPLANT
BLADE CLIPPER SURG (BLADE) IMPLANT
BLADE EXCALIBUR 4.0X13 (MISCELLANEOUS) ×4 IMPLANT
BNDG ELASTIC 6X10 VLCR STRL LF (GAUZE/BANDAGES/DRESSINGS) ×4 IMPLANT
BNDG ESMARK 6X9 LF (GAUZE/BANDAGES/DRESSINGS)
BNDG GAUZE ELAST 4 BULKY (GAUZE/BANDAGES/DRESSINGS) ×2 IMPLANT
COVER SURGICAL LIGHT HANDLE (MISCELLANEOUS) ×2 IMPLANT
COVER WAND RF STERILE (DRAPES) ×2 IMPLANT
CUFF TOURN SGL QUICK 34 (TOURNIQUET CUFF)
CUFF TOURN SGL QUICK 42 (TOURNIQUET CUFF) IMPLANT
CUFF TRNQT CYL 34X4.125X (TOURNIQUET CUFF) IMPLANT
DRAPE ARTHROSCOPY W/POUCH 114 (DRAPES) ×2 IMPLANT
DRAPE U-SHAPE 47X51 STRL (DRAPES) ×2 IMPLANT
DRSG PAD ABDOMINAL 8X10 ST (GAUZE/BANDAGES/DRESSINGS) ×4 IMPLANT
DRSG TEGADERM 4X4.75 (GAUZE/BANDAGES/DRESSINGS) ×6 IMPLANT
DURAPREP 26ML APPLICATOR (WOUND CARE) ×2 IMPLANT
DW OUTFLOW CASSETTE/TUBE SET (MISCELLANEOUS) ×2 IMPLANT
EVACUATOR 1/8 PVC DRAIN (DRAIN) ×2 IMPLANT
GAUZE SPONGE 4X4 12PLY STRL (GAUZE/BANDAGES/DRESSINGS) ×4 IMPLANT
GAUZE XEROFORM 1X8 LF (GAUZE/BANDAGES/DRESSINGS) IMPLANT
GAUZE XEROFORM 5X9 LF (GAUZE/BANDAGES/DRESSINGS) ×2 IMPLANT
GLOVE BIOGEL PI IND STRL 8 (GLOVE) ×1 IMPLANT
GLOVE BIOGEL PI INDICATOR 8 (GLOVE) ×1
GLOVE ECLIPSE 8.0 STRL XLNG CF (GLOVE) ×2 IMPLANT
GOWN STRL REUS W/ TWL LRG LVL3 (GOWN DISPOSABLE) ×2 IMPLANT
GOWN STRL REUS W/ TWL XL LVL3 (GOWN DISPOSABLE) ×1 IMPLANT
GOWN STRL REUS W/TWL LRG LVL3 (GOWN DISPOSABLE) ×2
GOWN STRL REUS W/TWL XL LVL3 (GOWN DISPOSABLE) ×1
KIT BASIN OR (CUSTOM PROCEDURE TRAY) ×2 IMPLANT
KIT STIMULAN 5CC (Orthopedic Implant) ×2 IMPLANT
KIT TURNOVER KIT B (KITS) ×2 IMPLANT
MANIFOLD NEPTUNE II (INSTRUMENTS) IMPLANT
NEEDLE 18GX1X1/2 (RX/OR ONLY) (NEEDLE) IMPLANT
NEEDLE HYPO 25GX1X1/2 BEV (NEEDLE) ×2 IMPLANT
NS IRRIG 1000ML POUR BTL (IV SOLUTION) IMPLANT
PACK ARTHROSCOPY DSU (CUSTOM PROCEDURE TRAY) ×2 IMPLANT
PAD ABD 8X10 STRL (GAUZE/BANDAGES/DRESSINGS) ×2 IMPLANT
PAD ARMBOARD 7.5X6 YLW CONV (MISCELLANEOUS) ×4 IMPLANT
PAD CAST 4YDX4 CTTN HI CHSV (CAST SUPPLIES) ×1 IMPLANT
PADDING CAST COTTON 4X4 STRL (CAST SUPPLIES) ×1
PADDING CAST COTTON 6X4 STRL (CAST SUPPLIES) ×2 IMPLANT
PORT APPOLLO RF 90DEGREE MULTI (SURGICAL WAND) ×2 IMPLANT
SPONGE LAP 4X18 RFD (DISPOSABLE) ×2 IMPLANT
SUT ETHILON 3 0 PS 1 (SUTURE) ×4 IMPLANT
SUT VIC AB 2-0 FS1 27 (SUTURE) ×2 IMPLANT
SYR 20ML ECCENTRIC (SYRINGE) ×2 IMPLANT
SYR CONTROL 10ML LL (SYRINGE) IMPLANT
SYR TB 1ML LUER SLIP (SYRINGE) ×2 IMPLANT
TOWEL GREEN STERILE (TOWEL DISPOSABLE) ×2 IMPLANT
TOWEL GREEN STERILE FF (TOWEL DISPOSABLE) ×2 IMPLANT
TUBE CONNECTING 12X1/4 (SUCTIONS) ×2 IMPLANT
TUBING ARTHROSCOPY IRRIG 16FT (MISCELLANEOUS) ×2 IMPLANT
WAND STAR VAC 90 (SURGICAL WAND) IMPLANT
WATER STERILE IRR 1000ML POUR (IV SOLUTION) ×2 IMPLANT

## 2019-10-07 NOTE — Progress Notes (Addendum)
Family Medicine Teaching Service Daily Progress Note Intern Pager: [phone]  Patient name: [name] Medical record number: [id number] Date of birth: [birth year] Age: 54 y.o. Gender: male  Primary Care Provider: [name], MD Consultants: Nephrology, vascular surgery, orthopedics   Code Status: Full Code  Pt Overview and Major Events to Date:  10/1 Admitted, HD attempted (terminated early due to pain) 10/2Inferior venacavogram(right), I&D of left thigh with graft excision, HD 10/4 Joint aspiration left knee 10/5 Plan for knee arthroscopy  Assessment and Plan: [name] is a 54 y.o. male presenting with inability to obtain access at his right femoral dialysis catheter while at HD, now with acute knee pain and swelling with fever concerning for septic arthritis. PMH is significant for ESRD on HD (MWF), PAD, kidney stones, spina bifida, hx of nephrostomy tube, HTN, GERD, anxiety.  Dialysis access issue Now resolved s/p inferior venacavogram.  Left thigh wound S/p I&D of left thigh with graft excision 10/2. Wound VAC placed again but was changed to wet-to-dry dressing wound VAC was apparently not working properly. Patient does not want wound VAC. - wet to dry dressing changes BID  Acute left knee pain Acute left knee pain and swelling, concerning for septic arthritis s/p joint aspiration. Synovial fluid with 17G WBC (neutrophilic predominance), though no organisms seen on Gram stain.  No crystals seen on synovial fluid.  Cultures pending. Orthopedics consulted, appreciate involvement.  Significant pain, swelling, and warmth on exam this morning.  Did spike another fever last night while on antibiotics. - plan for arthroscopic left knee irrigation and drainage with placement of antibiotic beads this afternoon per orthopedics - holding heparin for procedure - oxycodone 5 mg q6 prn - oxycodone-acetaminophen 5-325 mg q6 prn - IV hydromorphone 0.5 mg q4 prn - f/u  cultures - f/u orthopedic recs, vascular surgery recs  Fever Unclear etiology but concern for septic arthritis possible given acute onset of left knee pain and swelling. On broad-spectrum antibiotics. Spiked fever to 101.6 F while on antibiotics, patient unaware he was having a fever and did not have any new symptoms at that time.  Tylenol ordered overnight.  No further work-up indicated at this time given that he is on antibiotics for less than 48 hours and is pending orthopedic procedure for source control. - piperacillin-tazobactam (10/4-) - vancomycin MWF with HD (10/4-) - Tylenol prn - f/u blood cultures - f/u synovial fluid cultures - f/u left thigh wound cultures   ESRD MWF dialysis, continue while inpatient per nephrology.  Dysphagia Chronic, worse over past week. - SLP, may get FEES  Aortic stenosis Severe AS noted on echo this admission.  Likely contributing to his symptoms of chest pain and shortness of breath. - outpatient follow-up (sees Dr. Angelena Keith)   FEN/GI: NPO for procedure PPx: SCDs  Disposition: med-surg  Subjective:  Overnight, patient spiked fever to 101.6 F.  He was not aware he was having a fever and did not report any change in symptoms at that time.  He was given Tylenol and his fever has defervesced.  This morning, he is reporting significant left knee pain, frustrated that he has not gotten his Dilaudid yet due to shift change.  Objective: Temp:  [98 F (36.7 C)-101.6 F (38.7 C)] 99.7 F (37.6 C) (10/05 0450) Pulse Rate:  [70-77] 73 (10/05 0450) Resp:  [10-21] 18 (10/05 0450) BP: (80-118)/(28-67) 96/46 (10/05 0450) SpO2:  [96 %-100 %] 100 % (10/05 0450) Weight:  [114.5 kg-118 kg] 114.5 kg (10/04 1232) Physical Exam:  General:Obese male, appears uncomfortable, NAD Cardiovascular:RRR, 3/6 systolic murmur best heard at LUSB Respiratory:CTAB Abdomen:soft, +BS Extremities:WWP, left knee with diffuse tenderness, swelling and warmth  compared to right. See image below     Laboratory: Recent Labs  Lab 10/03/19 0742 10/06/19 0152 10/07/19 0430  WBC 6.4 9.8 5.4  HGB 10.1* 8.9* 8.2*  HCT 34.5* 30.8* 28.4*  PLT 170 159 132*   Recent Labs  Lab 10/05/19 0909 10/06/19 0152 10/07/19 0430  NA 137 133* 133*  K 4.5 5.6* 5.1  CL 99 95* 95*  CO2 26 29 25   BUN 41* 56* 42*  CREATININE 7.27* 8.73* 6.68*  CALCIUM 9.6 9.5 9.1  GLUCOSE 111* 127* 115*    Imaging/Diagnostic Tests: No new imaging.  Darrell Button, MD 10/07/2019, 7:18 AM PGY-1, [name] Intern pager: [phone], text pages welcome

## 2019-10-07 NOTE — Progress Notes (Addendum)
  Progress Note    10/07/2019 7:19 AM 3 Days Post-Op  Subjective:  Says his knee hurts  Tm 101.6 now 99.7   Vitals:   10/06/19 2318 10/07/19 0450  BP: (!) 104/42 (!) 96/46  Pulse: 74 73  Resp: 18 18  Temp: 99.9 F (37.7 C) 99.7 F (37.6 C)  SpO2: 96% 100%    Physical Exam: General:  Sleeping and awakes easily Lungs:  Non labored Incisions:  Left thigh wound clean without evidence of infection      CBC    Component Value Date/Time   WBC 5.4 10/07/2019 0430   RBC 2.88 (L) 10/07/2019 0430   HGB 8.2 (L) 10/07/2019 0430   HCT 28.4 (L) 10/07/2019 0430   PLT 132 (L) 10/07/2019 0430   MCV 98.6 10/07/2019 0430   MCH 28.5 10/07/2019 0430   MCHC 28.9 (L) 10/07/2019 0430   RDW 17.7 (H) 10/07/2019 0430   LYMPHSABS 0.3 (L) 10/06/2019 0152   MONOABS 0.9 10/06/2019 0152   EOSABS 0.0 10/06/2019 0152   BASOSABS 0.0 10/06/2019 0152    BMET    Component Value Date/Time   NA 133 (L) 10/07/2019 0430   NA 135 (A) 01/17/2018 0000   K 5.1 10/07/2019 0430   CL 95 (L) 10/07/2019 0430   CO2 25 10/07/2019 0430   GLUCOSE 115 (H) 10/07/2019 0430   BUN 42 (H) 10/07/2019 0430   BUN 67 (A) 01/17/2018 0000   CREATININE 6.68 (H) 10/07/2019 0430   CALCIUM 9.1 10/07/2019 0430   GFRNONAA 9 (L) 10/07/2019 0430   GFRAA 10 (L) 10/07/2019 0430    INR    Component Value Date/Time   INR 1.0 12/01/2018 0501     Intake/Output Summary (Last 24 hours) at 10/07/2019 0719 Last data filed at 10/07/2019 0601 Gross per 24 hour  Intake 600.43 ml  Output 3100 ml  Net -2499.57 ml     Assessment:  54 y.o. male is s/p:  #1: Inferior venacavogram #2: I&D of left thigh including resection of multiple old dialysis grafts and overlying skin. #3: Placement of wound VAC to left thigh (7 x 5 x 2 cm)  3 Days Post-Op   Plan: -left thigh wound clean without evidence of infection.  Tm of 101.6 over night.  Wound culture re-incubated for better growth.  Awaiting sensitivities.  Continue IV abx.     -pt does not want wound vac-continue wet to dry saline dressing changes bid.  -left knee pain-synovial fluid with no organisms on gram stain.  For arthroscopy today.   Leontine Locket, PA-C [facility] [phone] 10/07/2019 7:19 AM  Dressing changed.  The wound is far removed form joint space, so I think it is very unlikely that the 2  Spaces communicate.  Will need to monitor blood cultures and fevers given his HD catheter.   Annamarie Major

## 2019-10-07 NOTE — Progress Notes (Signed)
Patient seen and examined.  Patient has significant effusion in his left knee.  Agree with [name]'s note.  Plan on arthroscopic irrigation and debridement we will obtain cultures.  Placement of antibiotic beads and Hemovac drain which will come out tomorrow.  All questions answered.

## 2019-10-07 NOTE — Anesthesia Postprocedure Evaluation (Signed)
Anesthesia Post Note  Patient: [name]  Procedure(s) Performed: ARTHROSCOPY KNEE w/ STIMULATING  BEAD PLACMENT (Left Knee) DRESSING CHANGE UNDER ANESTHESIA (Left Knee)     Patient location during evaluation: PACU Anesthesia Type: General Level of consciousness: awake and alert and oriented Pain management: pain level controlled Vital Signs Assessment: post-procedure vital signs reviewed and stable Respiratory status: spontaneous breathing, nonlabored ventilation, respiratory function stable and patient connected to nasal cannula oxygen Cardiovascular status: stable and blood pressure returned to baseline Postop Assessment: no apparent nausea or vomiting Anesthetic complications: no   No complications documented.  Last Vitals:  Vitals:   10/07/19 1615 10/07/19 1630  BP: 118/63 (!) 104/59  Pulse: 79 76  Resp: 17 18  Temp: (!) 36.3 C   SpO2: 98% 96%    Last Pain:  Vitals:   10/07/19 1615  TempSrc:   PainSc: 0-No pain                 Naarah Borgerding,Ruston A.

## 2019-10-07 NOTE — Transfer of Care (Signed)
Immediate Anesthesia Transfer of Care Note  Patient: [name]  Procedure(s) Performed: ARTHROSCOPY KNEE w/ STIMULATING  BEAD PLACMENT (Left Knee) DRESSING CHANGE UNDER ANESTHESIA (Left Knee)  Patient Location: PACU  Anesthesia Type:General  Level of Consciousness: sedated and unresponsive  Airway & Oxygen Therapy: Patient Spontanous Breathing  Post-op Assessment: Report given to RN and Post -op Vital signs reviewed and stable  Post vital signs: Reviewed and stable  Last Vitals:  Vitals Value Taken Time  BP    Temp    Pulse 78 10/07/19 1613  Resp 15 10/07/19 1613  SpO2 95 % 10/07/19 1613  Vitals shown include unvalidated device data.  Last Pain:  Vitals:   10/07/19 0945  TempSrc: Oral  PainSc:       Patients Stated Pain Goal: 2 (27/61/47 0929)  Complications: No complications documented.

## 2019-10-07 NOTE — Anesthesia Procedure Notes (Signed)
Arterial Line Insertion Start/End10/05/2019 1:00 PM, 10/07/2019 1:15 PM Performed by: Kyung Rudd, CRNA, CRNA  Patient location: Pre-op. Preanesthetic checklist: patient identified, IV checked, site marked, risks and benefits discussed, surgical consent, monitors and equipment checked, pre-op evaluation and timeout performed Lidocaine 1% used for infiltration Right, radial was placed Catheter size: 20 G Hand hygiene performed , maximum sterile barriers used  and Seldinger technique used Allen's test indicative of satisfactory collateral circulation Attempts: 1 Procedure performed without using ultrasound guided technique. Following insertion, Biopatch and dressing applied. Post procedure assessment: normal  Patient tolerated the procedure well with no immediate complications.

## 2019-10-07 NOTE — Progress Notes (Signed)
SLP Cancellation Note  Patient Details Name: [name] MRN: [id number] DOB: 1965-07-12   Cancelled treatment:       Reason Eval/Treat Not Completed: Patient at procedure or test/unavailable   Sonia Baller, MA, CCC-SLP Speech Therapy [facility] Pager: [phone]

## 2019-10-07 NOTE — Op Note (Signed)
[name], [name] MEDICAL RECORD ZO:[id number] ACCOUNT [id number] DATE OF BIRTH:07-15-1965 FACILITY: [facility] LOCATION: [facility] PHYSICIAN:Giuseppina Quinones Randel Pigg, MD  OPERATIVE REPORT  DATE OF PROCEDURE:  10/07/2019  PREOPERATIVE DIAGNOSIS:  Left knee infection.  POSTOPERATIVE DIAGNOSIS:  Left knee infection.  PROCEDURE:   1.  Left knee arthroscopy with extensive debridement.   2.  Irrigation and placement of Stimulan beads. 3.  Changing of the left wound gauze.  SURGEON:  Meredith Pel, MD  ASSISTANT:  Annie Main, PA.  INDICATIONS:  The patient is a 54 year old patient with spina bifida with left knee effusion and white count 25,000 with fevers and chills.  He presents now for operative management of presumed left knee infection.  PROCEDURE IN DETAIL:  The patient was brought to the operating room where general anesthetic was induced.  Perioperative IV antibiotics were maintained.  This was known to effect potential culture results.  Left leg prescrubbed with alcohol and Betadine,  allowed to air dry, prepped with DuraPrep solution and draped in a sterile manner.  Timeout was called.  Anterior inferolateral portal was established, anteroinferior medial portal was established under direct visualization.  Diagnostic arthroscopy was  performed.  The patient had some bloody effusion within the knee.  Cultures were obtained.  The patient had severe end-stage arthritis in all 3 compartments.  No definite meniscal damage.  Extensive debridement was performed with the shaver and the  Arthrocare wand.  This was performed in all 3 compartments.  About 10 L of irrigating solution was irrigated through the knee.  Following this, as well as the debridement, instruments were removed and the Stimulan beads were placed through a cannula  placed to the anterosuperior left portal.  After Stimulan bead placement, a Hemovac drain was placed.  All portal incisions were closed using 2-0 Vicryl, 3-0 nylon.   Solution of Marcaine, morphine, clonidine injected into the knee for postop pain relief.   The knee was then wrapped.  Kerlix was then used to pack the wound in the left thigh region.  It was also covered with a dressing and Ace wrap.  The patient tolerated the procedure well without immediate complications, transferred to the recovery room  in stable condition.  [name]'s assistance was required for limb positioning, opening and closing.  His assistance was a medical necessity.  VN/NUANCE  D:10/07/2019 T:10/07/2019 JOB:[id number]

## 2019-10-07 NOTE — Progress Notes (Signed)
   10/06/19 2114  Assess: MEWS Score  Temp (!) 101.6 F (38.7 C)  BP (!) 105/58  Pulse Rate 77  Resp 18  SpO2 100 %  O2 Device Room Air  Assess: MEWS Score  MEWS Temp 2  MEWS Systolic 0  MEWS Pulse 0  MEWS RR 0  MEWS LOC 0  MEWS Score 2  MEWS Score Color Yellow  Assess: if the MEWS score is Yellow or Red  Were vital signs taken at a resting state? Yes  Focused Assessment No change from prior assessment  Early Detection of Sepsis Score *See Row Information* Low  MEWS guidelines implemented *See Row Information* Yes  Treat  MEWS Interventions Administered prn meds/treatments;Other (Comment) (MD paged for prn medication)  Take Vital Signs  Increase Vital Sign Frequency  Yellow: Q 2hr X 2 then Q 4hr X 2, if remains yellow, continue Q 4hrs  Escalate  MEWS: Escalate Yellow: discuss with charge nurse/RN and consider discussing with provider and RRT  Notify: Charge Nurse/RN  Name of Charge Nurse/RN Notified Percell Boston, RN  Date Charge Nurse/RN Notified 10/06/19  Time Charge Nurse/RN Notified 2145  Notify: Provider  Provider Name/Title Pashayan  Date Provider Notified 10/06/19  Time Provider Notified 2122  Notification Type Page  Notification Reason Change in status  Response See new orders  Notify: Rapid Response  Name of Rapid Response RN Notified  (no need to contact)  Document  Patient Outcome Stabilized after interventions  Progress note created (see row info) Yes

## 2019-10-07 NOTE — Progress Notes (Signed)
[facility] Progress Note   Subjective:   Completed dialysis yesterday net UF 3L.  Seen in room. Doesn't feel good this am, nauseated and having knee pain.    Objective Vitals:   10/06/19 2114 10/06/19 2318 10/07/19 0450 10/07/19 0900  BP: (!) 105/58 (!) 104/42 (!) 96/46 108/64  Pulse: 77 74 73   Resp: 18 18 18    Temp: (!) 101.6 F (38.7 C) 99.9 F (37.7 C) 99.7 F (37.6 C)   TempSrc: Oral Oral Oral   SpO2: 100% 96% 100%   Weight:      Height:       Physical Exam General: WNWD male, lying in bed, nad  Heart: RRR; 3/6 murmur Lungs: Clear bilaterally  Back: Chronic wound to L flank from prior PCN tube mild erythema - he says this is improved Abdomen: soft, non-tender Extremities: Trace BLE edema Dialysis Access: R thigh TDC, L thigh with bandage in place.  Additional Objective Labs: Basic Metabolic Panel: Recent Labs  Lab 10/05/19 0909 10/06/19 0152 10/07/19 0430  NA 137 133* 133*  K 4.5 5.6* 5.1  CL 99 95* 95*  CO2 26 29 25   GLUCOSE 111* 127* 115*  BUN 41* 56* 42*  CREATININE 7.27* 8.73* 6.68*  CALCIUM 9.6 9.5 9.1  PHOS 4.4 4.0 5.1*   Liver Function Tests: Recent Labs  Lab 10/05/19 0909 10/06/19 0152 10/07/19 0430  ALBUMIN 3.3* 3.4* 2.8*   CBC: Recent Labs  Lab 10/03/19 0742 10/06/19 0152 10/07/19 0430  WBC 6.4 9.8 5.4  NEUTROABS  --  8.5*  --   HGB 10.1* 8.9* 8.2*  HCT 34.5* 30.8* 28.4*  MCV 98.6 98.1 98.6  PLT 170 159 132*   Medications: . piperacillin-tazobactam (ZOSYN)  IV 2.25 g (10/07/19 0120)  . vancomycin 1,000 mg (10/06/19 1118)   . chlorhexidine  60 mL Topical Once  . Chlorhexidine Gluconate Cloth  6 each Topical Q0600  . doxercalciferol  3 mcg Intravenous Q M,W,F-HD  . famotidine  20 mg Oral Daily  . levothyroxine  137 mcg Oral QAC breakfast  . lidocaine  2 patch Transdermal Daily  . pantoprazole  40 mg Oral QAC breakfast  . promethazine  12.5 mg Intravenous Once in dialysis  . sodium polystyrene  15 g Oral  Once per day on Sun Sat    [facility] Optiflux 200NRe, Time: 4hr 16min, BFR 350, DFR 800, EDW 114.5kg, 2K/2.,25Ca, TDC heparin 3700 unit bolus Mircera 244mcg IV q 2 weeks- last dose 9/27 Venofer 100mg  q HD -last dose due today Phoslo 4 caps TO TID with meals and 3 with snacks Hectorol 3 mcg IV q HD Kayexalate 15g PO on non-dialysis days   Assessment/Plan: 1. ESRD:HD MWF.  S/p [facility] manipulation by Dr. Trula Slade 10/2 as well as L thigh debridement with old AVG removal. Dialyzed afterwards and TDC seems to be running decently. Next HD 10/6.  2. Hyperkalemia: Recurrent issue - pt takes kayexalate on non-dialysis today. 3. Fevers. Blood cultures neg to date.  Possible sources of infection include: dialysis catheter, left thigh wound, knee effusion --cultures pending. IV antibiotics per primary team  4. L thigh wound/infected old AVG: S/p excision/debridement 10/2 - wound vac placed - removed d/t dysfunction. Wound cultures pending.  5. L knee pain/effusion - s/p joint aspiration 10/4. Cultures pending.  6. Hypertension/volume:BP low/stable. Still a bit above EDW - no dyspnea.  7. Anemia:Hgb 8.9. ESA dosed 9/27, follow trends. 8. Metabolic bone disease:Ca/Phos ok. Continue hectorol  and phoslo. 9. Nutrition:Renal diet with 1.2L fluid restriction recommended 10. Pleuritic chest pain/SOB:Possibly due to excess volume, though CXR is clear. Resolved now. 11. L flank wound: D/t prior PCN tube - some erythema, no drainage - pt reports this is improved, follow closely. 12. Dispo: Pending   Lynnda Child PA-C [facility] 10/07/2019,9:36 AM

## 2019-10-07 NOTE — Anesthesia Preprocedure Evaluation (Addendum)
Anesthesia Evaluation    Reviewed: Allergy & Precautions, Patient's Chart, lab work & pertinent test results  History of Anesthesia Complications Negative for: history of anesthetic complications  Airway Mallampati: II  TM Distance: >3 FB Neck ROM: Full    Dental no notable dental hx.    Pulmonary neg pulmonary ROS,    Pulmonary exam normal breath sounds clear to auscultation       Cardiovascular hypertension, + Peripheral Vascular Disease  Normal cardiovascular exam+ Valvular Problems/Murmurs AS  Rhythm:Regular Rate:Normal   '21 TTE - EF 65 to 70%. Mild left ventricular hypertrophy.  Grade II diastolic dysfunction (pseudonormalization). Left atrial size was moderately dilated. Trivial mitral valve regurgitation. The aortic valve is functionally bicuspid. Severe AS. AV mean gradient measures 37.2 mmHg. Aortic valve Vmax measures 3.88 m/s. Dimentionless index 0.25     Neuro/Psych  Headaches, Seizures -, Well Controlled,  PSYCHIATRIC DISORDERS Anxiety  Spina bifida     GI/Hepatic Neg liver ROS, GERD  Medicated,  Endo/Other  Hypothyroidism  Obesity Na 133 Cl 95   Renal/GU ESRF and DialysisRenal disease     Musculoskeletal negative musculoskeletal ROS (+)   Abdominal (+) + obese,   Peds  Hematology  (+) anemia ,  On plavix Thrombocytopenia, Plt 132k    Anesthesia Other Findings Covid test negative   Reproductive/Obstetrics                            Anesthesia Physical Anesthesia Plan  ASA: IV  Anesthesia Plan: General   Post-op Pain Management:    Induction: Intravenous  PONV Risk Score and Plan: 2 and Treatment may vary due to age or medical condition, Ondansetron and Midazolam  Airway Management Planned: LMA  Additional Equipment: Arterial line  Intra-op Plan:   Post-operative Plan: Extubation in OR  Informed Consent:   Plan Discussed with: CRNA and  Anesthesiologist  Anesthesia Plan Comments:        Anesthesia Quick Evaluation

## 2019-10-07 NOTE — Brief Op Note (Signed)
   10/07/2019  4:10 PM  PATIENT:  [name]  54 y.o. male  PRE-OPERATIVE DIAGNOSIS:  LEFT KNEE infection  POST-OPERATIVE DIAGNOSIS:  LEFT KNEE infection  PROCEDURE:  Procedure(s): ARTHROSCOPY and debridement  KNEE w/ STIMULAIN  BEAD PLACMENT DRESSING CHANGE UNDER ANESTHESIA  SURGEON:  Surgeon(s): Meredith Pel, MD  ASSISTANT: magnant pa  ANESTHESIA:   general  EBL: 5 ml    Total I/O In: 250 [IV Piggyback:250] Out: 5 [Blood:5]  BLOOD ADMINISTERED: none  DRAINS: none   LOCAL MEDICATIONS USED:  stimulan beads and marcaine mso4 and clonidine SPECIMEN:  cxs x 1  COUNTS:  YES  TOURNIQUET:  * No tourniquets in log *  DICTATION: .Other Dictation: Dictation Number [id number]  PLAN OF CARE: Admit to inpatient   PATIENT DISPOSITION:  PACU - hemodynamically stable

## 2019-10-07 NOTE — Anesthesia Procedure Notes (Signed)
Procedure Name: LMA Insertion Date/Time: 10/07/2019 2:45 PM Performed by: Gwyndolyn Saxon, CRNA Pre-anesthesia Checklist: Patient identified, Emergency Drugs available, Suction available and Patient being monitored Patient Re-evaluated:Patient Re-evaluated prior to induction Oxygen Delivery Method: Circle system utilized Preoxygenation: Pre-oxygenation with 100% oxygen Induction Type: IV induction Ventilation: Mask ventilation without difficulty and Oral airway inserted - appropriate to patient size LMA: LMA inserted LMA Size: 5.0 Number of attempts: 1 Airway Equipment and Method: Patient positioned with wedge pillow Placement Confirmation: positive ETCO2 and breath sounds checked- equal and bilateral Dental Injury: Teeth and Oropharynx as per pre-operative assessment  Difficulty Due To: Difficult Airway- due to large tongue

## 2019-10-08 ENCOUNTER — Encounter (HOSPITAL_COMMUNITY): Payer: Self-pay | Admitting: Orthopedic Surgery

## 2019-10-08 DIAGNOSIS — T148XXA Other injury of unspecified body region, initial encounter: Secondary | ICD-10-CM

## 2019-10-08 DIAGNOSIS — M009 Pyogenic arthritis, unspecified: Secondary | ICD-10-CM | POA: Diagnosis not present

## 2019-10-08 DIAGNOSIS — L089 Local infection of the skin and subcutaneous tissue, unspecified: Secondary | ICD-10-CM

## 2019-10-08 LAB — RENAL FUNCTION PANEL
Albumin: 3 g/dL — ABNORMAL LOW (ref 3.5–5.0)
Albumin: 2.9 g/dL — ABNORMAL LOW (ref 3.5–5.0)
Anion gap: 15 (ref 5–15)
Anion gap: 15 (ref 5–15)
BUN: 60 mg/dL — ABNORMAL HIGH (ref 6–20)
BUN: 64 mg/dL — ABNORMAL HIGH (ref 6–20)
CO2: 20 mmol/L — ABNORMAL LOW (ref 22–32)
CO2: 22 mmol/L (ref 22–32)
Calcium: 8.7 mg/dL — ABNORMAL LOW (ref 8.9–10.3)
Calcium: 9 mg/dL (ref 8.9–10.3)
Chloride: 97 mmol/L — ABNORMAL LOW (ref 98–111)
Chloride: 95 mmol/L — ABNORMAL LOW (ref 98–111)
Creatinine, Ser: 8.8 mg/dL — ABNORMAL HIGH (ref 0.61–1.24)
Creatinine, Ser: 8.78 mg/dL — ABNORMAL HIGH (ref 0.61–1.24)
GFR calc non Af Amer: 6 mL/min — ABNORMAL LOW (ref >60)
GFR calc non Af Amer: 6 mL/min — ABNORMAL LOW (ref >60)
Glucose, Bld: 197 mg/dL — ABNORMAL HIGH (ref 70–99)
Glucose, Bld: 103 mg/dL — ABNORMAL HIGH (ref 70–99)
Phosphorus: 4.3 mg/dL (ref 2.5–4.6)
Phosphorus: 8.6 mg/dL — ABNORMAL HIGH (ref 2.5–4.6)
Potassium: 5.4 mmol/L — ABNORMAL HIGH (ref 3.5–5.1)
Potassium: 6.2 mmol/L — ABNORMAL HIGH (ref 3.5–5.1)
Sodium: 132 mmol/L — ABNORMAL LOW (ref 135–145)
Sodium: 132 mmol/L — ABNORMAL LOW (ref 135–145)

## 2019-10-08 LAB — CBC
HCT: 27.3 % — ABNORMAL LOW (ref 39.0–52.0)
HCT: 27.4 % — ABNORMAL LOW (ref 39.0–52.0)
Hemoglobin: 7.9 g/dL — ABNORMAL LOW (ref 13.0–17.0)
Hemoglobin: 7.9 g/dL — ABNORMAL LOW (ref 13.0–17.0)
MCH: 28.8 pg (ref 26.0–34.0)
MCH: 28.4 pg (ref 26.0–34.0)
MCHC: 28.9 g/dL — ABNORMAL LOW (ref 30.0–36.0)
MCHC: 28.8 g/dL — ABNORMAL LOW (ref 30.0–36.0)
MCV: 99.6 fL (ref 80.0–100.0)
MCV: 98.6 fL (ref 80.0–100.0)
Platelets: 123 10*3/uL — ABNORMAL LOW (ref 150–400)
Platelets: 129 10*3/uL — ABNORMAL LOW (ref 150–400)
RBC: 2.74 MIL/uL — ABNORMAL LOW (ref 4.22–5.81)
RBC: 2.78 MIL/uL — ABNORMAL LOW (ref 4.22–5.81)
RDW: 17.6 % — ABNORMAL HIGH (ref 11.5–15.5)
RDW: 17.7 % — ABNORMAL HIGH (ref 11.5–15.5)
WBC: 6 10*3/uL (ref 4.0–10.5)
WBC: 7.8 10*3/uL (ref 4.0–10.5)
nRBC: 0 % (ref 0.0–0.2)
nRBC: 0 % (ref 0.0–0.2)

## 2019-10-08 MED ORDER — ALTEPLASE 2 MG IJ SOLR: 2.0000 mg | Freq: Once | INTRAMUSCULAR | Status: AC

## 2019-10-08 MED ORDER — VANCOMYCIN HCL IN DEXTROSE 1-5 GM/200ML-% IV SOLN
INTRAVENOUS | Status: AC
  Administered 2019-10-08: 1000 mg via INTRAVENOUS
  Filled 2019-10-08: qty 200

## 2019-10-08 MED ORDER — SODIUM CHLORIDE 0.9 % IV SOLN: 100.0000 mL | INTRAVENOUS | Status: DC | PRN

## 2019-10-08 MED ORDER — SODIUM CHLORIDE 0.9 % IV SOLN
2.0000 g | INTRAVENOUS | Status: AC
  Administered 2019-10-08 – 2019-10-09 (×2): 2 g via INTRAVENOUS
  Filled 2019-10-08 (×2): qty 20

## 2019-10-08 MED ORDER — PROMETHAZINE HCL 25 MG/ML IJ SOLN: 12.5000 mg | Freq: Once | INTRAMUSCULAR | Status: AC

## 2019-10-08 MED ORDER — PROMETHAZINE HCL 25 MG/ML IJ SOLN
INTRAMUSCULAR | Status: AC
  Administered 2019-10-08: 12.5 mg via INTRAVENOUS
  Filled 2019-10-08: qty 1

## 2019-10-08 MED ORDER — HEPARIN SODIUM (PORCINE) 1000 UNIT/ML DIALYSIS: 3500.0000 [IU] | Freq: Once | INTRAMUSCULAR | Status: DC

## 2019-10-08 MED ORDER — LIDOCAINE-PRILOCAINE 2.5-2.5 % EX CREA: 1.0000 "application " | TOPICAL_CREAM | CUTANEOUS | Status: DC | PRN

## 2019-10-08 MED ORDER — DOXERCALCIFEROL 4 MCG/2ML IV SOLN
INTRAVENOUS | Status: AC
  Administered 2019-10-08: 3 ug via INTRAVENOUS
  Filled 2019-10-08: qty 2

## 2019-10-08 MED ORDER — PENTAFLUOROPROP-TETRAFLUOROETH EX AERO: 1.0000 "application " | INHALATION_SPRAY | CUTANEOUS | Status: DC | PRN

## 2019-10-08 MED ORDER — ALTEPLASE 2 MG IJ SOLR
INTRAMUSCULAR | Status: AC
  Administered 2019-10-08: 6 mg
  Filled 2019-10-08: qty 6

## 2019-10-08 MED ORDER — HYDROMORPHONE HCL 1 MG/ML IJ SOLN
INTRAMUSCULAR | Status: AC
  Administered 2019-10-08: 0.5 mg via INTRAVENOUS
  Filled 2019-10-08: qty 0.5

## 2019-10-08 MED ORDER — ALTEPLASE 2 MG IJ SOLR: 2.0000 mg | Freq: Once | INTRAMUSCULAR | Status: DC | PRN

## 2019-10-08 MED ORDER — LIDOCAINE HCL (PF) 1 % IJ SOLN: 5.0000 mL | INTRAMUSCULAR | Status: DC | PRN

## 2019-10-08 MED ORDER — HEPARIN SODIUM (PORCINE) 1000 UNIT/ML DIALYSIS: 1000.0000 [IU] | INTRAMUSCULAR | Status: DC | PRN

## 2019-10-08 NOTE — Progress Notes (Signed)
Family Medicine Teaching Service Daily Progress Note Intern Pager: [phone]  Patient name: [name] Medical record number: [id number] Date of birth: [birth year] Age: 54 y.o. Gender: male  Primary Care Provider: [name], MD Consultants: Nephrology, vascular surgery, orthopedics   Code Status: Full Code  Pt Overview and Major Events to Date:  10/1 Admitted, HD attempted (terminated early due to pain) 10/2Inferior venacavogram(right), I&D of left thigh with graft excision, HD 10/4 Joint aspiration left knee 10/5 Arthroscopic left knee irrigation and drainage with placement of antibiotic beads  Assessment and Plan: [name] is a 54 y.o. male presenting with inability to obtain access at his right femoral dialysis catheter while at HD, now with acute knee pain and swelling with fever concerning for septic arthritis. PMH is significant for ESRD on HD (MWF), PAD, kidney stones, spina bifida, hx of nephrostomy tube, HTN, GERD, anxiety.  Dialysis access issue Now resolved s/p inferior venacavogram.  Left thigh wound S/p I&D of left thigh with graft excision 10/2.  Wound VAC placed twice, was not working properly. Wound culture growing few corynebacterium striatum, unclear if contaminant but on vancomycin which should provide coverage. - wet to dry dressing changes BID - vancomycin as below  Acute left knee pain Concerning for septic arthritis s/p joint aspiration and arthroscopic left knee irrigation and drainage with placement of antibiotic beads. Synovial fluid with 93O WBC (neutrophilic predominance), though no organisms seen on Gram stain.  No crystals seen on synovial fluid.  Cultures pending. Orthopedics consulted, appreciate involvement.  Some improvement this morning following procedure yesterday. - oxycodone 5 mg q6 prn - oxycodone-acetaminophen 5-325 mg q6 prn - IV hydromorphone 0.5 mg q4 prn - f/u cultures  Fever Unclear etiology but concern for septic  arthritis possible given acute onset of left knee pain and swelling. As above, wound culture growing few corynebacterium striatum, unclear if contaminant. Will continue broad spectrum antibiotics, plan to follow synovial fluid and surgical cultures a bit longer before adjusting antibiotic regimen. Last fever 10/4. - piperacillin-tazobactam (10/4-) - vancomycin MWF with HD (10/4-) - Tylenol prn - f/u blood cultures - f/u synovial fluid cultures - f/u left thigh wound cultures   ESRD MWF dialysis, continue while inpatient per nephrology.  Dysphagia Chronic, worse over past week. - SLP, may get FEES  Aortic stenosis Severe AS noted on echo this admission.  Likely contributing to his symptoms of chest pain and shortness of breath. - outpatient follow-up (sees Dr. Angelena Form)  FEN/GI: renal diet PPx: SCDs  Disposition: med-surg  Subjective:  Seen in dialysis. This morning, patient still reporting significant amount of left knee pain, but improved since yesterday after the procedure.  Frustrated that his pillows were left in the OR yesterday.  Objective: Temp:  [97.3 F (36.3 C)-99 F (37.2 C)] 98.9 F (37.2 C) (10/06 0434) Pulse Rate:  [65-88] 65 (10/06 0724) Resp:  [17-19] 18 (10/06 0720) BP: (93-118)/(45-63) 93/45 (10/06 0905) SpO2:  [95 %-100 %] 98 % (10/06 0720) Arterial Line BP: (114-126)/(54-58) 114/54 (10/05 1715) Weight:  [114.5 kg-117.2 kg] 117.2 kg (10/06 0720) Physical Exam: General:Obese male, appears uncomfortable, NAD Cardiovascular:RRR, 3/6 systolic murmur best heard at LUSB Respiratory:CTAB Abdomen:soft, +BS Extremities:WWP, left lower extremity with bandage in place    Laboratory: Recent Labs  Lab 10/07/19 0430 10/08/19 0115 10/08/19 0739  WBC 5.4 6.0 7.8  HGB 8.2* 7.9* 7.9*  HCT 28.4* 27.3* 27.4*  PLT 132* 123* 129*   Recent Labs  Lab 10/07/19 0430 10/08/19 0115 10/08/19 0748  NA  133* 132* 132*  K 5.1 5.4* 6.2*  CL 95* 97* 95*  CO2  25 20* 22  BUN 42* 60* 64*  CREATININE 6.68* 8.80* 8.78*  CALCIUM 9.1 8.7* 9.0  GLUCOSE 115* 197* 103*    Imaging/Diagnostic Tests: No new imaging.  Zola Button, MD 10/08/2019, 9:36 AM PGY-1, [place] Intern pager: [phone], text pages welcome

## 2019-10-08 NOTE — Consult Note (Signed)
  Subjective: Patient is 54 y.o. Male who is POD1 s/p arthroscopic I&D of left knee.  Doing well overall, pain is improved compared with preoperatively.  Reports no fevers, chills currently.     Objective: Vital signs in last 24 hours: Temp:  [98.2 F (36.8 C)-98.9 F (37.2 C)] 98.5 F (36.9 C) (10/06 1530) Pulse Rate:  [64-88] 73 (10/06 1242) Resp:  [16-19] 16 (10/06 1530) BP: (75-115)/(39-63) 94/44 (10/06 1530) SpO2:  [96 %-100 %] 100 % (10/06 1530) Arterial Line BP: (114)/(54) 114/54 (10/05 1715) Weight:  [115.5 kg-117.2 kg] 115.5 kg (10/06 1154)  Intake/Output from previous day: 10/05 0701 - 10/06 0700 In: 800 [I.V.:400; IV Piggyback:400] Out: 5 [Blood:5] Intake/Output this shift: Total I/O In: 360 [P.O.:360] Out: -2500   Exam:  Hemovac drain in place.  Dressings intact without gross blood or drainage aside from lateral incision which had large amount of bleeding and soaked through. This was replaced.  DF/PF of left ankle intact. Left foot warm and well-perfused  Labs: Recent Labs    10/06/19 0152 10/07/19 0430 10/08/19 0115 10/08/19 0739  HGB 8.9* 8.2* 7.9* 7.9*   Recent Labs    10/08/19 0115 10/08/19 0739  WBC 6.0 7.8  RBC 2.74* 2.78*  HCT 27.3* 27.4*  PLT 123* 129*   Recent Labs    10/08/19 0115 10/08/19 0748  NA 132* 132*  K 5.4* 6.2*  CL 97* 95*  CO2 20* 22  BUN 60* 64*  CREATININE 8.80* 8.78*  GLUCOSE 197* 103*  CALCIUM 8.7* 9.0   No results for input(s): LABPT, INR in the last 72 hours.  Assessment/Plan: Cultures pending.  ID consulted.  Continue IV abx.  Follow-up with Dr. Alphonzo Severance in clinic one week after discharge.  Dressing was soaked with blood after hemovac drain removal.  Dressing replaced and thigh wound re-packed.  Will continue to follow.   Johnmatthew Solorio L Alysandra Lobue 10/08/2019, 5:04 PM

## 2019-10-08 NOTE — Consult Note (Addendum)
[facility]    Date of Admission:  10/03/2019     Reason for Consult: sepsis/septic arthritis/wound infection    Referring Provider: Ardelia Mems    Lines:  Right groin HD  Abx: 10/2-c vanc 10/02-c piptazo 2.25 q8hour        Assessment: [name] is a 54 y.o. male spina bifida, myelomeningocele, neurogenic bladder s/p ileal conduit, s/p ileostomy, httn/hlp/PAD, esrd, very difficult dialysis access situation, chart hx iliac artery stent, GERD, bicuspid valve with severe AS, OA, hx native MV endocarditis (MSSA), hx left renal abscess (fusobacterium), admitted for rle vascular graft site infection, course complicated by swollen right knee presumed septic arthritis   Patient appears to have had failed vascular graft left thigh that had been infected for at least several weeks. He was given some abx in the dialysis clinic a few days prior to admission. Culture of the graft material/surrouding tissue only grow corynebacterium species. He subsequently had acute monoarthritis of the contiguous left knee (operative cx on abx negative; no crystal).   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 Also various gram positive/negative on gram stain of left thigh wound in the past  In that setting, I would treat with 1 week of vancomycin to cover for the corynebacterium species (now that the graft is out), and have him on levofloxacin for probable gram negative coverage as he received prior abx and cx could have been negative due to that. After 1 week of vancomycin, would continue tx with doxycycline and levofloxacin to finish off total of 4 week abx treatment. This focuses on the deep skin soft tissue infection associated with the graft and probable left knee septic arthritis  He had nonspecific chest pain, a tte without obvious vegeation and negative blood culture, so not sure what to make of the chest pain but  perhaps multifactorial including severe AS and some musculoskeletal component. Perhaps also an esophageal component. Should be f/u'ed by cardiology  On a final note, I am unsure if his left renal-percutaneous sinus tract will become reinfected. If it does he might need the tract removal. He already is at high risk of HD access failure, and would like to avoid source of infection that could affect the HD access  He had prior iliac stent per chart but off abx after his MV endocarditis seem to do fine, so will not place on suppressive abx course   Plan: 1. Stop piptazo; continue vancomycin per pharmacy; start levofloxacin HD dosing 2. Duration of above combination is 7 days from 10/5 until 10/12; on 10/12 will continue levofloxacin and start doxycycline for another 3 weeks until 11/02  ------- Addendum Norfloxacin allergy-- will review tomorrow with patient; in the mean time, instead of levoflox, start ceftriaxone 2 gram iv daily  Active Problems:   ESRD (end stage renal disease) ([place])   Complication associated with dialysis catheter   Hyperkalemia   Dysphagia   Pain and swelling of knee, left   Pyogenic arthritis of left knee joint (HCC)   Scheduled Meds: . Chlorhexidine Gluconate Cloth  6 each Topical Q0600  . docusate sodium  100 mg Oral BID  . doxercalciferol  3 mcg Intravenous Q M,W,F-HD  . famotidine  20 mg Oral Daily  . heparin  5,000 Units Subcutaneous Q8H  . levothyroxine  137 mcg Oral QAC breakfast  . lidocaine  2 patch Transdermal Daily  . pantoprazole  40 mg Oral QAC breakfast  .  sodium polystyrene  15 g Oral Once per day on Sun Sat   Continuous Infusions: . methocarbamol (ROBAXIN) IV    . piperacillin-tazobactam (ZOSYN)  IV 2.25 g (10/08/19 0119)  . vancomycin Stopped (10/08/19 1122)   PRN Meds:.acetaminophen, HYDROmorphone (DILAUDID) injection, methocarbamol **OR** methocarbamol (ROBAXIN) IV, metoCLOPramide **OR** metoCLOPramide (REGLAN) injection, Muscle Rub,  ondansetron **OR** ondansetron (ZOFRAN) IV, oxyCODONE-acetaminophen **AND** oxyCODONE, promethazine, traZODone, zolpidem  HPI: [name] is a 54 y.o. male spina bifida, myelomeningocele, neurogenic bladder s/p ileal conduit, s/p ileostomy, httn/hlp/PAD, esrd, very difficult dialysis access situation, GERD, bicuspid valve with severe AS, OA, hx native MV endocarditis (MSSA), hx left renal abscess, chart hx iliac stent, admitted for rle vascular graft site infection, course complicated by swollen right knee presumed septic arthritis  Patient has been dialyzed via right groin catheter to his IVC via collateral. This was exchanged in 2019 and replaced subsequently in [place].   On the day of admission, the catheter wasn't able to be accessed so sent to ER and admitted for catheter management  He mentioned chronic pain left flank, medial left thigh where the failed graft is. Has been receiving opioids outpatient  He reports intermittent shooting precordial chest pain that is nonpositional, non-activity related, and not pleuritic.   The left thigh graft had been there for 2-3 years. Since it failed, there is a pin-hole wound where it was previously accessed that never healed. The last 3-4 weeks he noticed increased pain there with a "bruise" and purulent output along with fever up to 101s at home. He was given some iv abx via dialysis unit a few days prior to admission  He also reports prior nephrostomy tube left kidney for obstructive stone but those removed. He had complication of left renal abscess 11/2018 polymicrobial including fusobacterium betalactamase  Negative treated with 10 week abx. 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 He didn't have any covid infection  At baseline walks some but gets around on powered wheel chair. Make some urine  Hospital course: 10/01 admission afebrile; hds No leukocytosis 10/02 started on vanc/piptazo for ?left chronic thigh wound. The left thigh wound was I&D and the old retained graft was removed by vascular surgery. cx grew corynebacterium striatum. HD line working again after venogram 10/03 febrile 101s 10/04 persistent fever; bcx drawn; right knee effusion noted. Underwent operative arthroscopic I&D; aspirated 97W wbc neutrophilic but cx negative (in setting current abx); stimulan absorpable abx bead placement. Knee fluid noted to be bloody, and severe endstage arthritis noted. 10/05 fever resolved  Today doing well; tolerating abx. Complains of left flank/left thigh pain moderate intensity. Could bend/extend his left knee well  Review of Systems: Negative 11 point ros unless mentioned above  Past Medical History:  Diagnosis Date  . Anemia   . Anxiety   . Constipation   . End stage renal failure on dialysis [facility])    [place]; MWF, on HD since 1997, exhausted upper ext access, and possibly LE access; cath dependent in R groin as of 06/2018  . GERD (gastroesophageal reflux disease)   . Grand mal seizure ([place]) X 4   last 1998 (02/29/2016)  . Heart murmur    no problems with per pt. - nephrologist  and Dr. Coletta Memos  . History of blood transfusion 2000s   "I was having blood loss; never found out from where"  . History of kidney stones   . Hypertension    hx of - has not taken bp meds in over 2 years  since dialysis  . Hypothyroidism   . Insomnia   . Migraine    "due to my BP in my late 1990s; none since" (02/29/2016)  . Nonhealing surgical wound    of the left arteriovenous graft  . Pneumonia    x 2  . Spina bifida [facility])     Social History   Tobacco Use  . Smoking status: Never Smoker  . Smokeless tobacco: Never Used  Vaping Use  . Vaping Use: Never  used  Substance Use Topics  . Alcohol use: Not Currently    Alcohol/week: 0.0 standard drinks    Comment: 02/29/2016 "nothing since  the mid 1990s  . Drug use: Not Currently    Types: Marijuana    Comment: 02/29/2016 "nothing since ~ 1995"    Family History  Problem Relation Age of Onset  . Diabetes Mother   . Hypertension Mother   . Heart disease Mother        before age 69  . Diabetes Father   . Heart attack Father        X's 3  . Diabetes Sister   . Bipolar disorder Sister    Allergies  Allergen Reactions  . Hydrocodone Other (See Comments)    Caused involuntary movement and twitching. CANNOT TAKE DUE TO MUSCLE SPASMS AND MUSCLE TREMORS  . Furadantin [Nitrofurantoin] Other (See Comments)    UNSPECIFIED REACTION   . Mandelamine [Methenamine] Other (See Comments)    UNSPECIFIED REACTION   . Noroxin [Norfloxacin] Other (See Comments)    UNSPECIFIED REACTION   . Sulfa Antibiotics Other (See Comments) and Cough    Childhood reaction - pt could not confirm that it was a cough  . Sulfur Cough    Childhood reaction - pt could not confirm that it was a cough    OBJECTIVE: Blood pressure (!) 97/51, pulse 73, temperature 98.8 F (37.1 C), temperature source Oral, resp. rate 18, height 5\' 11"  (1.803 m), weight 115.5 kg, SpO2 100 %.  Physical Exam  No distress; pleasant; conversant Heent; Normocephalic, per, conj clear Neck supple cv rrr 3/6 systolic murmur upper sternal border Lungs clear normal respiratory effort abd s/nt Ext atrophic bilateral LE; no edema Skin left cva is a tract opening mildly tender no fluctuance/discharge but mucoid stain on dressing; left thigh in dressing; drain present (reviewed the admission picture of the draining wound left thigh) msk no spinous process tenderness; right knee full rom, with anterior knee scar, no swelling or tenderness; left knee tender anteriorly, fairly good passive rom without tenderness. S/p left hallux amputation Neuro cn2-12  intact, strength symmetric, strength exam limited by left LE pain Psych alert. Oriented   Lab Results Lab Results  Component Value Date   WBC 7.8 10/08/2019   HGB 7.9 (L) 10/08/2019   HCT 27.4 (L) 10/08/2019   MCV 98.6 10/08/2019   PLT 129 (L) 10/08/2019    Lab Results  Component Value Date   CREATININE 8.78 (H) 10/08/2019   BUN 64 (H) 10/08/2019   NA 132 (L) 10/08/2019   K 6.2 (H) 10/08/2019   CL 95 (L) 10/08/2019   CO2 22 10/08/2019    Lab Results  Component Value Date   ALT 9  03/20/2019   AST 13 (L) 03/20/2019   ALKPHOS 79 03/20/2019   BILITOT 0.6 03/20/2019     Microbiology: Recent Results (from the past 240 hour(s))  Resp Panel by RT PCR (RSV, Flu A&B, Covid) - Nasopharyngeal Swab     Status: None   Collection Time: 10/03/19  1:18 PM   Specimen: Nasopharyngeal Swab  Result Value Ref Range Status   SARS Coronavirus 2 by RT PCR NEGATIVE NEGATIVE Final    Comment: (NOTE) SARS-CoV-2 target nucleic acids are NOT DETECTED.  The SARS-CoV-2 RNA is generally detectable in upper respiratoy specimens during the acute phase of infection. The lowest concentration of SARS-CoV-2 viral copies this assay can detect is 131 copies/mL. A negative result does not preclude SARS-Cov-2 infection and should not be used as the sole basis for treatment or other patient management decisions. A negative result may occur with  improper specimen collection/handling, submission of specimen other than nasopharyngeal swab, presence of viral mutation(s) within the areas targeted by this assay, and inadequate number of viral copies (<131 copies/mL). A negative result must be combined with clinical observations, patient history, and epidemiological information. The expected result is Negative.  Fact Sheet for Patients:  PinkCheek.be  Fact Sheet for Healthcare Providers:  GravelBags.it  This test is no t yet approved or cleared by the  [place] FDA and  has been authorized for detection and/or diagnosis of SARS-CoV-2 by FDA under an Emergency Use Authorization (EUA). This EUA will remain  in effect (meaning this test can be used) for the duration of the COVID-19 declaration under Section 564(b)(1) of the Act, 21 U.S.C. section 360bbb-3(b)(1), unless the authorization is terminated or revoked sooner.     Influenza A by PCR NEGATIVE NEGATIVE Final   Influenza B by PCR NEGATIVE NEGATIVE Final    Comment: (NOTE) The Xpert Xpress SARS-CoV-2/FLU/RSV assay is intended as an aid in  the diagnosis of influenza from Nasopharyngeal swab specimens and  should not be used as a sole basis for treatment. Nasal washings and  aspirates are unacceptable for Xpert Xpress SARS-CoV-2/FLU/RSV  testing.  Fact Sheet for Patients: PinkCheek.be  Fact Sheet for Healthcare Providers: GravelBags.it  This test is not yet approved or cleared by the [place] FDA and  has been authorized for detection and/or diagnosis of SARS-CoV-2 by  FDA under an Emergency Use Authorization (EUA). This EUA will remain  in effect (meaning this test can be used) for the duration of the  Covid-19 declaration under Section 564(b)(1) of the Act, 21  U.S.C. section 360bbb-3(b)(1), unless the authorization is  terminated or revoked.    Respiratory Syncytial Virus by PCR NEGATIVE NEGATIVE Final    Comment: (NOTE) Fact Sheet for Patients: PinkCheek.be  Fact Sheet for Healthcare Providers: GravelBags.it  This test is not yet approved or cleared by the [place] FDA and  has been authorized for detection and/or diagnosis of SARS-CoV-2 by  FDA under an Emergency Use Authorization (EUA). This EUA will remain  in effect (meaning this test can be used) for the duration of the  COVID-19 declaration under Section 564(b)(1) of the Act, 21  U.S.C.  section 360bbb-3(b)(1), unless the authorization is terminated or  revoked. Performed at [facility], [place] [address], [place], [place] [address]   Surgical pcr screen     Status: None   Collection Time: 10/03/19 10:24 PM   Specimen: Nasal Mucosa; Nasal Swab  Result Value Ref Range Status   MRSA, PCR NEGATIVE NEGATIVE Final  Staphylococcus aureus NEGATIVE NEGATIVE Final    Comment: (NOTE) The Xpert SA Assay (FDA approved for NASAL specimens in patients 56 years of age and older), is one component of a comprehensive surveillance program. It is not intended to diagnose infection nor to guide or monitor treatment. Performed at [facility], [place] [address], [place], [place] [address]   Aerobic/Anaerobic Culture (surgical/deep wound)     Status: None (Preliminary result)   Collection Time: 10/04/19  8:39 AM   Specimen: Wound; Body Fluid  Result Value Ref Range Status   Specimen Description WOUND  Final   Special Requests LEFT THIGH SPEC  Final   Gram Stain   Final    NO WBC SEEN RARE GRAM POSITIVE RODS Performed at [facility], [address], [place], [place] [address]    Culture   Final    FEW CORYNEBACTERIUM STRIATUM Standardized susceptibility testing for this organism is not available. NO ANAEROBES ISOLATED; CULTURE IN PROGRESS FOR 5 DAYS    Report Status PENDING  Incomplete  Culture, blood (routine x 2)     Status: None (Preliminary result)   Collection Time: 10/06/19  1:47 AM   Specimen: BLOOD LEFT HAND  Result Value Ref Range Status   Specimen Description BLOOD LEFT HAND  Final   Special Requests   Final    BOTTLES DRAWN AEROBIC AND ANAEROBIC Blood Culture results may not be optimal due to an inadequate volume of blood received in culture bottles   Culture   Final    NO GROWTH 2 DAYS Performed at [facility], [place] [address], [place], [place] [address]    Report Status PENDING  Incomplete  Culture, blood (routine x 2)     Status:  None (Preliminary result)   Collection Time: 10/06/19  1:52 AM   Specimen: BLOOD  Result Value Ref Range Status   Specimen Description BLOOD RIGHT ANTECUBITAL  Final   Special Requests   Final    BOTTLES DRAWN AEROBIC ONLY Blood Culture adequate volume   Culture   Final    NO GROWTH 2 DAYS Performed at [facility], [place] [address], [place], [place] [address]    Report Status PENDING  Incomplete  Body fluid culture     Status: None (Preliminary result)   Collection Time: 10/06/19  3:06 AM   Specimen: Synovial Fluid  Result Value Ref Range Status   Specimen Description FLUID SYNOVIAL LEFT KNEE  Final   Special Requests NONE  Final   Gram Stain   Final    MANY WBC PRESENT, PREDOMINANTLY PMN NO ORGANISMS SEEN    Culture   Final    NO GROWTH 2 DAYS Performed at [facility], [place] [address] [place], [place] [address]    Report Status PENDING  Incomplete  Anaerobic culture     Status: None (Preliminary result)   Collection Time: 10/06/19  3:06 AM   Specimen: Synovial Fluid  Result Value Ref Range Status   Specimen Description FLUID SYNOVIAL LEFT KNEE  Final   Special Requests NONE  Final   Culture   Final    NO GROWTH 2 DAYS NO ANAEROBES ISOLATED; CULTURE IN PROGRESS FOR 5 DAYS Performed at [facility], [place] [address], [place], [place] [address]    Report Status PENDING  Incomplete  Surgical pcr screen     Status: None   Collection Time: 10/07/19  1:29 AM   Specimen: Nasal Mucosa; Nasal Swab  Result Value Ref Range Status   MRSA, PCR NEGATIVE NEGATIVE Final   Staphylococcus aureus NEGATIVE NEGATIVE Final    Comment: (NOTE) The Xpert SA Assay (FDA approved for NASAL specimens in patients 65 years of age and older), is one component of a comprehensive surveillance program. It is not intended to diagnose infection nor to guide or monitor treatment. Performed at [facility], [place] [address], [place], [place] [address]   Aerobic/Anaerobic Culture  (surgical/deep wound)     Status: None (Preliminary result)   Collection Time: 10/07/19  3:17 PM   Specimen: Joint, Other; Body Fluid  Result Value Ref Range Status   Specimen Description FLUID  Final   Special Requests LEFT KNEE JOINT SPEC A  Final   Gram Stain   Final    MODERATE WBC PRESENT,BOTH PMN AND MONONUCLEAR NO ORGANISMS SEEN    Culture   Final    NO GROWTH < 24 HOURS Performed at [facility], [place] [address] [place], [place] [address]    Report Status PENDING  Incomplete    10/01 cbc 6/10/170; cr 9; no lft  10/04 left knee synovial fluid cx negative (25k wbc mostly neutrophilic); fluid turbid/red; no crystal 10/04 bcx negative 10/02 vascular graft site cx corynebacterium striatum   10/01 covid, rsv, flu pcr negative  Imaging: 10/04 ct renal stone study FINDINGS: Lower chest: The lung bases are clear. The heart size is normal.  Hepatobiliary: The liver is normal. Normal gallbladder.There is no biliary ductal dilation.  Pancreas: Normal contours without ductal dilatation. No peripancreatic fluid collection.  Spleen: The spleen is significantly enlarged measuring approximately 15 cm craniocaudad.  Adrenals/Urinary Tract:  --Adrenal glands: Unremarkable.  --Right kidney/ureter: The right kidney is atrophic with multiple nonobstructing stones.  --Left kidney/ureter: The left kidney is atrophic. The previously demonstrated for ostomy tube has been removed. There is a tract from the left kidney to the patient's skin surface on the left flank. This may represent a draining sinus tract. Chronic inflammatory changes are noted in the left retroperitoneum about the left kidney.  --Urinary bladder: The urinary bladder is decompressed and therefore is poorly evaluated.  Stomach/Bowel:  --Stomach/Duodenum: No hiatal hernia or other gastric abnormality. Normal duodenal course and caliber.  --Small bowel: There is a right lower quadrant  ileostomy.  --Colon: There is a large amount of stool in the colon, especially at the level of the rectum.  --Appendix: Normal.  Vascular/Lymphatic: There is a right femoral approach tunneled dialysis catheter with tip terminating at the level of the suprarenal IVC. There is a left common iliac stent in place. The infrarenal IVC is atretic and may be chronically occluded as evidence by multiple collateral veins coursing through the patient's anterior abdomen. Atherosclerotic changes are noted throughout the abdominal aorta without evidence for an aneurysm. A left femoral dialysis graft is again noted and likely chronically occluded.  --No retroperitoneal lymphadenopathy.  --No mesenteric lymphadenopathy.  --No pelvic or inguinal lymphadenopathy.  Reproductive: Unremarkable  Other: No ascites or free air. There is mild body wall edema.  Musculoskeletal. There is renal osteodystrophy. Chronic cystic changes are noted of the bilateral femoral heads.  IMPRESSION: 1. Status post removal of the previously demonstrated left-sided nephrostomy tube. There is a sinus tract coursing from the left kidney to the patient's left flank which may explain the patient's symptoms. There is no well-formed drainable fluid collection. 2. Multiple additional chronic findings are noted as detailed above  10/03 tte 1. Left ventricular ejection  fraction, 65 to 70%. The left ventricle has no regional wall motion abnormalities. There is mild left ventricular hypertrophy.  Left ventricular diastolic parameters are consistent with Grade II diastolic dysfunction (pseudonormalization).  2. Right ventricular systolic function is normal. The right ventricular  size is normal. Tricuspid regurgitation signal is inadequate for assessing  PA pressure.  3. Left atrial size was moderately dilated.  4. The mitral valve is abnormal. Trivial mitral valve regurgitation. Moderate mitral annular  calcification.  5. The aortic valve is functionally bicuspid. There is moderate calcification of the aortic valve and evidence of severe stenosis.  6. The inferior vena cava is normal in size with greater than 50% respiratory variability, suggesting right atrial pressure of 3 mmHg  10/03 cta chest No pe The brachiocephalic veins and superior vena cava are very narrow, with some evidence of internal calcification. There is extensive vascular collateralization about the chest wall. 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 Coronary artery disease. Aortic valve calcifications. Aortic Atherosclerosis  10/3 left knee xray 1. No acute displaced fracture or dislocation. 2. Extensive tricompartmental degenerative changes, greatest within the lateral and patellofemoral compartments. 3. Large suprapatellar joint effusion. 4. Osteopenia.  Jabier Mutton, [facility] [place] [phone] pager    10/08/2019, 1:06 PM

## 2019-10-08 NOTE — Progress Notes (Addendum)
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 Patient seen in HD unit. Has left lower extremity surgical dressing intact from ortho procedure yesterday. Thigh wound not examined.  Continue wet-to-dry dressing changes.wound appeared clean on yesterday's examination.  Zosyn and vanc continue.  Left thigh AV graft wound culture results: FEW CORYNEBACTERIUM STRIATUM  Standardized susceptibility testing for this organism is not available.  NO ANAEROBES ISOLATED; CULTURE IN PROGRESS FOR 5 DAYS   ESRD: HD via right femoral TDC s/p inferior venogram 10/02 with patent vena cava above tip of catheter  Fever: Tmax 99 yesterday morning at 0945.  WBC 7.8.  BC no growth after 24 hours.  Continue to monitor  Left knee infection: s/p left knee arthroscopy with debridement. Synovial fluid cultures pending.   SUBJECTIVE:   Complaining of postoperative left knee pain, radiating to lower leg.  Denies chills.  PHYSICAL EXAM:   Vitals:   10/07/19 1715 10/07/19 1757 10/07/19 2059 10/08/19 0434  BP: (!) 99/54 (!) 107/58 115/63 (!) 105/58  Pulse: 77 75 86 88  Resp: 19 18 18 18   Temp:  98.4 F (36.9 C) 98.7 F (37.1 C) 98.9 F (37.2 C)  TempSrc:  Oral Oral Oral  SpO2: 96% 98% 100% 98%  Weight:      Height:       General appearance: Patient awake, alert and in no apparent distress Lungs: Clear to auscultation bilaterally Cardiac: Heart rate and rhythm with holosystolic murmur Left lower extremity: Ace wrap in place from lower leg to proximal thigh.  Clean and dry.  Hemovac drain in place.  Left foot warm with active range of motion. Right thigh: TDC accessed with HD underway; no apparent complications   LABS:   Lab Results  Component Value Date   WBC 7.8 10/08/2019   HGB 7.9 (L) 10/08/2019   HCT 27.4 (L) 10/08/2019   MCV 98.6 10/08/2019   PLT 129 (L) 10/08/2019   Lab Results  Component Value Date    CREATININE 8.80 (H) 10/08/2019   Lab Results  Component Value Date   INR 1.0 12/01/2018   CBG (last 3)  No results for input(s): GLUCAP in the last 72 hours.  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 Chlorhexidine Gluconate Cloth  6 each Topical Q0600  . docusate sodium  100 mg Oral BID  . doxercalciferol  3 mcg Intravenous Q M,W,F-HD  . famotidine  20 mg Oral Daily  . [START ON 10/09/2019] heparin  3,500 Units Dialysis Once in dialysis  . heparin  5,000 Units Subcutaneous Q8H  . levothyroxine  137 mcg Oral QAC breakfast  . lidocaine  2 patch Transdermal Daily  . pantoprazole  40 mg Oral QAC breakfast  . sodium polystyrene  15 g Oral Once per day on Sun Sat    Risa Grill, [place] Office: [phone] 10/08/2019    I agree with the above.  The patient's fever curve is slightly diminished after I&D of the left knee.  Cultures of his thigh graft excision grew out a few Corynebacterium.  I would not remove the patient's right thigh catheter unless it is our only option.  It will be difficult  to replace this.  It would have to be a change over wire as he does not have any other access sites.  Continue wet-to-dry dressings of the left thigh.  This wound does not.  To be a source of infection.  Annamarie Major

## 2019-10-08 NOTE — Progress Notes (Signed)
[facility] Progress Note   Subjective:   Had I&D left knee in OR yesterday  Seen in HD unit. Afebrile.    Objective Vitals:   10/08/19 0724 10/08/19 0735 10/08/19 0750 10/08/19 0805  BP: (!) 114/58 (!) 114/58 106/62 (!) 101/52  Pulse: 65     Resp:      Temp:      TempSrc:      SpO2:      Weight:      Height:       Physical Exam General: WNWD male, lying in bed, nad  Heart: RRR; 3/6 murmur Lungs: Clear bilaterally  Abdomen: soft, non-tender Extremities: Trace BLE edema; L knee bandaged  Dialysis Access: R thigh TDC, L thigh with bandage in place.  Additional Objective Labs: Basic Metabolic Panel: Recent Labs  Lab 10/06/19 0152 10/07/19 0430 10/08/19 0115  NA 133* 133* 132*  K 5.6* 5.1 5.4*  CL 95* 95* 97*  CO2 29 25 20*  GLUCOSE 127* 115* 197*  BUN 56* 42* 60*  CREATININE 8.73* 6.68* 8.80*  CALCIUM 9.5 9.1 8.7*  PHOS 4.0 5.1* 4.3   Liver Function Tests: Recent Labs  Lab 10/06/19 0152 10/07/19 0430 10/08/19 0115  ALBUMIN 3.4* 2.8* 3.0*   CBC: Recent Labs  Lab 10/03/19 0742 10/03/19 0742 10/06/19 0152 10/06/19 0152 10/07/19 0430 10/08/19 0115 10/08/19 0739  WBC 6.4   < > 9.8   < > 5.4 6.0 7.8  NEUTROABS  --   --  8.5*  --   --   --   --   HGB 10.1*   < > 8.9*   < > 8.2* 7.9* 7.9*  HCT 34.5*   < > 30.8*   < > 28.4* 27.3* 27.4*  MCV 98.6  --  98.1  --  98.6 99.6 98.6  PLT 170   < > 159   < > 132* 123* 129*   < > = values in this interval not displayed.   Medications: . [START ON 10/09/2019] sodium chloride    . [START ON 10/09/2019] sodium chloride    . methocarbamol (ROBAXIN) IV    . piperacillin-tazobactam (ZOSYN)  IV 2.25 g (10/08/19 0119)  . vancomycin 1,000 mg (10/06/19 1118)   . Chlorhexidine Gluconate Cloth  6 each Topical Q0600  . docusate sodium  100 mg Oral BID  . doxercalciferol  3 mcg Intravenous Q M,W,F-HD  . famotidine  20 mg Oral Daily  . [START ON 10/09/2019] heparin  3,500 Units Dialysis Once in dialysis  .  heparin  5,000 Units Subcutaneous Q8H  . levothyroxine  137 mcg Oral QAC breakfast  . lidocaine  2 patch Transdermal Daily  . pantoprazole  40 mg Oral QAC breakfast  . sodium polystyrene  15 g Oral Once per day on Sun Sat    [facility] Optiflux 200NRe, Time: 4hr 27min, BFR 350, DFR 800, EDW 114.5kg, 2K/2.,25Ca, TDC heparin 3700 unit bolus Mircera 233mcg IV q 2 weeks- last dose 9/27 Venofer 100mg  q HD -last dose due today Phoslo 4 caps TO TID with meals and 3 with snacks Hectorol 3 mcg IV q HD Kayexalate 15g PO on non-dialysis days   Assessment/Plan: 1. ESRD:HD MWF.  S/p [facility] manipulation by Dr. Trula Slade 10/2 as well as L thigh debridement with old AVG removal. Dialyzed afterwards and TDC seems to be running decently. Next HD 10/6.  2. Hyperkalemia: Recurrent issue - pt takes kayexalate on non-dialysis today. 3. Fevers. Blood  cultures neg to date.  Possible sources of infection include: dialysis catheter, left thigh wound, septic arthritis  -- IV antibiotics per primary team  4. L thigh wound/infected old AVG: S/p excision/debridement 10/2 - wound vac placed - removed d/t dysfunction. Wound cultures pending.  5. L knee pain/effusion - s/p joint aspiration 10/4. Concern for septic arthrits 25K WBCs in fluid. To OR for I &D 10/5 --Cultures pending.  6. Hypertension/volume:BP low/stable. Still a bit above EDW - no dyspnea.  7. Anemia:Hgb 8.9>7.9. ESA dosed 9/27, follow trends. 8. Metabolic bone disease:Ca/Phos ok. Continue hectorol and phoslo. 9. Nutrition:Renal diet with 1.2L fluid restriction recommended 10. L flank wound: D/t prior PCN tube - some erythema, no drainage - pt reports this is improved, follow closely. 11. Dispo: Pending   Lynnda Child PA-C [facility] 10/08/2019,8:26 AM

## 2019-10-08 NOTE — Progress Notes (Signed)
SLP Cancellation Note  Patient Details Name: [name] MRN: [id number] DOB: [birth year]   Cancelled treatment:       Reason Eval/Treat Not Completed: Patient at procedure or test/unavailable   Berdene Askari, Katherene Ponto 10/08/2019, 7:46 AM

## 2019-10-08 NOTE — Plan of Care (Signed)
°  Problem: Coping: °Goal: Level of anxiety will decrease °Outcome: Progressing °  °

## 2019-10-09 ENCOUNTER — Encounter (HOSPITAL_COMMUNITY): Payer: Self-pay | Admitting: Family Medicine

## 2019-10-09 DIAGNOSIS — M009 Pyogenic arthritis, unspecified: Secondary | ICD-10-CM | POA: Diagnosis not present

## 2019-10-09 DIAGNOSIS — L089 Local infection of the skin and subcutaneous tissue, unspecified: Secondary | ICD-10-CM

## 2019-10-09 DIAGNOSIS — T148XXA Other injury of unspecified body region, initial encounter: Secondary | ICD-10-CM | POA: Diagnosis not present

## 2019-10-09 DIAGNOSIS — N186 End stage renal disease: Secondary | ICD-10-CM | POA: Diagnosis not present

## 2019-10-09 DIAGNOSIS — A419 Sepsis, unspecified organism: Secondary | ICD-10-CM

## 2019-10-09 DIAGNOSIS — T827XXA Infection and inflammatory reaction due to other cardiac and vascular devices, implants and grafts, initial encounter: Secondary | ICD-10-CM

## 2019-10-09 LAB — AEROBIC/ANAEROBIC CULTURE W GRAM STAIN (SURGICAL/DEEP WOUND): Gram Stain: NONE SEEN

## 2019-10-09 LAB — RENAL FUNCTION PANEL
Albumin: 3.1 g/dL — ABNORMAL LOW (ref 3.5–5.0)
Anion gap: 16 — ABNORMAL HIGH (ref 5–15)
BUN: 45 mg/dL — ABNORMAL HIGH (ref 6–20)
CO2: 25 mmol/L (ref 22–32)
Calcium: 9.5 mg/dL (ref 8.9–10.3)
Chloride: 94 mmol/L — ABNORMAL LOW (ref 98–111)
Creatinine, Ser: 6.72 mg/dL — ABNORMAL HIGH (ref 0.61–1.24)
GFR calc non Af Amer: 9 mL/min — ABNORMAL LOW (ref >60)
Glucose, Bld: 107 mg/dL — ABNORMAL HIGH (ref 70–99)
Phosphorus: 5.2 mg/dL — ABNORMAL HIGH (ref 2.5–4.6)
Potassium: 4.6 mmol/L (ref 3.5–5.1)
Sodium: 135 mmol/L (ref 135–145)

## 2019-10-09 LAB — BODY FLUID CULTURE: Culture: NO GROWTH

## 2019-10-09 MED ORDER — ASPIRIN 325 MG PO TABS
325.0000 mg | ORAL_TABLET | Freq: Every day | ORAL | Status: DC
  Filled 2019-10-09: qty 1

## 2019-10-09 MED ORDER — FAMOTIDINE 20 MG PO TABS: 10.0000 mg | ORAL_TABLET | Freq: Every day | ORAL | Status: DC

## 2019-10-09 MED ORDER — HYDROMORPHONE HCL 1 MG/ML IJ SOLN
1.0000 mg | INTRAMUSCULAR | Status: DC | PRN
  Administered 2019-10-09 – 2019-10-12 (×11): 1 mg via INTRAVENOUS
  Filled 2019-10-09 (×11): qty 1

## 2019-10-09 MED ORDER — CALCIUM ACETATE (PHOS BINDER) 667 MG PO CAPS
2001.0000 mg | ORAL_CAPSULE | ORAL | Status: DC
  Administered 2019-10-09 – 2019-10-17 (×6): 2001 mg via ORAL
  Filled 2019-10-09 (×9): qty 3

## 2019-10-09 MED ORDER — CALCIUM ACETATE (PHOS BINDER) 667 MG PO CAPS
2668.0000 mg | ORAL_CAPSULE | Freq: Three times a day (TID) | ORAL | Status: DC
  Administered 2019-10-09 – 2019-10-18 (×18): 2668 mg via ORAL
  Filled 2019-10-09 (×18): qty 4

## 2019-10-09 MED ORDER — FAMOTIDINE 20 MG PO TABS: 10.0000 mg | ORAL_TABLET | ORAL | Status: DC

## 2019-10-09 MED ORDER — CLOPIDOGREL BISULFATE 75 MG PO TABS
75.0000 mg | ORAL_TABLET | Freq: Every day | ORAL | Status: DC
  Administered 2019-10-09 – 2019-10-18 (×9): 75 mg via ORAL
  Filled 2019-10-09 (×10): qty 1

## 2019-10-09 MED ORDER — ASPIRIN EC 325 MG PO TBEC
325.0000 mg | DELAYED_RELEASE_TABLET | Freq: Every day | ORAL | Status: DC
  Administered 2019-10-09 – 2019-10-18 (×7): 325 mg via ORAL
  Filled 2019-10-09 (×9): qty 1

## 2019-10-09 MED ORDER — FAMOTIDINE 20 MG PO TABS: 20.0000 mg | ORAL_TABLET | Freq: Every day | ORAL | Status: DC

## 2019-10-09 MED ORDER — SODIUM CHLORIDE 0.9 % IV SOLN
2.0000 g | INTRAVENOUS | Status: DC
  Administered 2019-10-10 – 2019-10-17 (×4): 2 g via INTRAVENOUS
  Filled 2019-10-09 (×8): qty 2

## 2019-10-09 MED ORDER — OXYCODONE HCL 5 MG PO TABS
10.0000 mg | ORAL_TABLET | Freq: Four times a day (QID) | ORAL | Status: DC | PRN
  Administered 2019-10-09 – 2019-10-18 (×18): 10 mg via ORAL
  Filled 2019-10-09 (×17): qty 2

## 2019-10-09 MED ORDER — FAMOTIDINE 20 MG PO TABS
10.0000 mg | ORAL_TABLET | ORAL | Status: DC
  Administered 2019-10-10 – 2019-10-17 (×4): 10 mg via ORAL
  Filled 2019-10-09 (×5): qty 1

## 2019-10-09 MED ORDER — ACETAMINOPHEN 500 MG PO TABS
1000.0000 mg | ORAL_TABLET | Freq: Three times a day (TID) | ORAL | Status: DC
  Administered 2019-10-09 – 2019-10-18 (×26): 1000 mg via ORAL
  Filled 2019-10-09 (×26): qty 2

## 2019-10-09 NOTE — Progress Notes (Signed)
  Speech Language Pathology Treatment: Dysphagia  Patient Details Name: [name] MRN: [id number] DOB: December 18, 1965 Today's Date: 10/09/2019 Time: 9024-0973 SLP Time Calculation (min) (ACUTE ONLY): 20 min  Assessment / Plan / Recommendation Clinical Impression  Pt seen for f/u after multiple unsuccessful attempts this week.  He discusses at length his belief that his tonsils are capturing food particles and occasional meds, requiring throat-clearing/expectoration to clear. Observation of consumption of liquids does lead to some grimacing - this is also noted during pt's speech/conversation, and may be more behavioral in nature.  There are no obvious signs of an oropharyngeal dysphagia and he does not endorse critical changes with swallow function. There is no pain upon swallowing today.  Pt agrees that f/u with ENT in the future (as OP) may be most beneficial, particularly if his concern is related primarily to tonsils.  No further SLP f/u is needed.  ENT f/u will offer more diagnostic benefit than MBS or FEES. Pt agrees.    HPI HPI: [name] is a 54 y.o. male presenting with inability to obtain access at his right femoral dialysis catheter while at HD. PMH is significant for ESRD on HD (MWF), PAD, kidney stones, spina bifida, hx of nephrostomy tube, HTN, GERD, anxiety. S/P irrigation and debridement of left thigh and echange of dialysis catheter 10/2 am.       SLP Plan  Discharge SLP treatment due to improved overall toleration of regular diet, no acute care SLP needs      Recommendations  Diet recommendations: Regular;Thin liquid Liquids provided via: Cup;Straw Medication Administration: Whole meds with liquid Supervision: Patient able to self feed                Oral Care Recommendations: Oral care BID SLP Visit Diagnosis: Dysphagia, unspecified (R13.10) Plan: Discharge SLP treatment due to (comment)       GO              [name] L. Tivis Ringer, [place] CCC/SLP Acute  Rehabilitation Services Office number [phone] Pager [phone]  [name] 10/09/2019, 4:44 PM

## 2019-10-09 NOTE — Progress Notes (Signed)
   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 Wound remains clean with very early signs of granulation. Continue wet-to-dry dressing. I encouraged him to reconsider negative pressure wound care dressing. Zosyn and vanc continue.  Left thigh AV graft wound culture results: FEW CORYNEBACTERIUM STRIATUM  Standardized susceptibility testing for this organism is not available.  NO ANAEROBES ISOLATED; CULTURE IN PROGRESS FOR 5 DAYS   ESRD: HD via right femoral TDC s/p inferior venogram 10/02 with patent vena cava above tip of catheter  Fever: Tmax 99.6 yesterday evening at 2037.  No new CBC results.  BC no growth after 48 hours.  Continue to monitor.  Left knee infection: s/p left knee arthroscopy with debridement. Synovial fluid cultures pending.   SUBJECTIVE:   Still with post-op knee pain. Complaining of inability to get any sleep last night.  PHYSICAL EXAM:   Vitals:   10/08/19 1801 10/08/19 2037 10/08/19 2154 10/09/19 0521  BP: (!) 95/43 (!) 86/45 117/62 (!) 103/54  Pulse: 82 83 84 74  Resp: 18 17  18   Temp: 97.7 F (36.5 C) 99.6 F (37.6 C)  99 F (37.2 C)  TempSrc: Oral Oral  Oral  SpO2: 100% 100%  98%  Weight:      Height:       General appearance: Patient awake, alert and in no apparent distress Lungs: Clear to auscultation bilaterally Cardiac: Heart rate and rhythm with holosystolic murmur Left lower extremity: Dressing from thigh wound removed and replaced.  Scant serosanguineous drainage.  No odor.  Wound measures approx. 6 x 6 cm with tunneling laterally to about 4 cm depth. Left foot warm with active range of motion. Right thigh: TDC in place     LABS:   Lab Results  Component Value Date   WBC 7.8 10/08/2019   HGB 7.9 (L) 10/08/2019   HCT 27.4 (L) 10/08/2019   MCV 98.6 10/08/2019   PLT 129 (L) 10/08/2019   Lab Results  Component Value Date    CREATININE 6.72 (H) 10/09/2019   Lab Results  Component Value Date   INR 1.0 12/01/2018   CBG (last 3)  No results for input(s): GLUCAP in the last 72 hours.  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 Chlorhexidine Gluconate Cloth  6 each Topical Q0600  . docusate sodium  100 mg Oral BID  . doxercalciferol  3 mcg Intravenous Q M,W,F-HD  . famotidine  20 mg Oral Daily  . heparin  5,000 Units Subcutaneous Q8H  . levothyroxine  137 mcg Oral QAC breakfast  . lidocaine  2 patch Transdermal Daily  . pantoprazole  40 mg Oral QAC breakfast  . sodium polystyrene  15 g Oral Once per day on Sun Sat    Risa Grill, PA-c Office: [phone] 10/09/2019

## 2019-10-09 NOTE — Progress Notes (Signed)
  Subjective: Patient is POD2 s/p left knee arthroscopic I&D. Complains of continued left knee pain.  Denies any fevers, chills at this time. Does note malaise but no significant worsening compared with last several days. Cultures are still showing no growth.     Objective: Vital signs in last 24 hours: Temp:  [97.7 F (36.5 C)-99.6 F (37.6 C)] 98.3 F (36.8 C) (10/07 1634) Pulse Rate:  [69-84] 69 (10/07 1634) Resp:  [17-18] 18 (10/07 1634) BP: (86-117)/(43-67) 105/51 (10/07 1634) SpO2:  [98 %-100 %] 99 % (10/07 1634)  Intake/Output from previous day: 10/06 0701 - 10/07 0700 In: 1150 [P.O.:840; IV Piggyback:310] Out: -2500  Intake/Output this shift: Total I/O In: 720 [P.O.:720] Out: 0   Exam:  Dressings intact without gross blood or drainage. Knee effusion present.  Mildly increased warmth of left knee compared with contralateral side.  DF/PF intact, no calf tenderness.   Labs: Recent Labs    10/07/19 0430 10/08/19 0115 10/08/19 0739  HGB 8.2* 7.9* 7.9*   Recent Labs    10/08/19 0115 10/08/19 0739  WBC 6.0 7.8  RBC 2.74* 2.78*  HCT 27.3* 27.4*  PLT 123* 129*   Recent Labs    10/08/19 0748 10/09/19 0156  NA 132* 135  K 6.2* 4.6  CL 95* 94*  CO2 22 25  BUN 64* 45*  CREATININE 8.78* 6.72*  GLUCOSE 103* 107*  CALCIUM 9.0 9.5   No results for input(s): LABPT, INR in the last 72 hours.  Assessment/Plan: POD2 s/p left knee arthroscopic I&D -Continue IV abx.   -Cultures without growth to date - Follow-up with Dr. Marlou Sa one week following discharge.  Will continue to follow   Donella Stade 10/09/2019, 5:29 PM

## 2019-10-09 NOTE — Evaluation (Signed)
Occupational Therapy Evaluation Patient Details Name: [name] MRN: [id number] DOB: [birth year] Today's Date: 10/09/2019    History of Present Illness [name] is a 54 y.o. male presenting with inability to obtain access at his right femoral dialysis catheter while at HD, now with acute knee pain and swelling with fever concerning for septic arthritis. PMH is significant for ESRD on HD (MWF), PAD, kidney stones, spina bifida, hx of nephrostomy tube, HTN, GERD, anxiety 10/1 Admitted, HD attempted (terminated early due to pain); 10/2Inferior venacavogram(right), I&D of left thigh with graft excision, HD, 10/4 Joint aspiration left knee; 10/5 Arthroscopic left knee irrigation and drainage with placement of antibiotic beads.    Clinical Impression   PTA Pt at home, uses [facility] for most of mobility - but also walks around kitchen and for short distances in home. Pt mod I for ADL and able to bathe/dress self. Today Pt is able to perform bed mobility at supervision level to come to EOB Pt using RLE to assist with LLE. Set up for grooming at the EOB. Pt mod A for LB dressing at this time. Pt with +2 assist attempted standing from EOB and Pt was unable to clear buttocks off the bed despite max A. Pt was able to perform lateral scoot up the bed with mod A. At this time recommending SNF post-acute until Pt can transfer independently again, and to maximize safety and independence in ADL and functional transfers. Pt requests a grabber/reacher to assist with LB ADL at this time.     Follow Up Recommendations  SNF    Equipment Recommendations  Other (comment) (drop arm BSC)    Recommendations for Other Services       Precautions / Restrictions Precautions Precautions: Fall Restrictions Weight Bearing Restrictions: No      Mobility Bed Mobility Overal bed mobility: Needs Assistance Bed Mobility: Supine to Sit;Sit to Supine     Supine to sit: Supervision;HOB elevated (use of rail) Sit  to supine: Min assist (for management of LLE back into bed)   General bed mobility comments: LLE painful, Pt able to hook with RLE and move OOB, use of rails. Pt with hospital bed at home  Transfers Overall transfer level: Needs assistance Equipment used: 2 person hand held assist Transfers: Sit to/from Stand;Lateral/Scoot Transfers Sit to Stand: Max assist;Total assist        Lateral/Scoot Transfers: Mod assist General transfer comment: Pt unable to clear buttocks off the bed, Pt able to assist with lateral scoot up the bed, limited by pain and weakness in LLE    Balance Overall balance assessment: Needs assistance Sitting-balance support: Bilateral upper extremity supported;Feet supported Sitting balance-Leahy Scale: Fair Sitting balance - Comments: dependent on BUE but no external support needed   Standing balance support: Bilateral upper extremity supported Standing balance-Leahy Scale: Zero Standing balance comment: unable to achieve standing at this time                           ADL either performed or assessed with clinical judgement   ADL Overall ADL's : Needs assistance/impaired Eating/Feeding: Independent   Grooming: Set up;Sitting   Upper Body Bathing: Minimal assistance;Sitting   Lower Body Bathing: Moderate assistance;Sitting/lateral leans   Upper Body Dressing : Set up;Sitting   Lower Body Dressing: Moderate assistance;Maximal assistance;Sitting/lateral leans;Bed level Lower Body Dressing Details (indicate cue type and reason): able to don R sock, but not L - typicaly can do this Toilet Transfer: Total  assistance Toilet Transfer Details (indicate cue type and reason): unable to clear bottom off the bed at this time, he is able to perform lateral scoot transfer up the bed with mod A +2 Toileting- Clothing Manipulation and Hygiene: Minimal assistance;Sitting/lateral lean       Functional mobility during ADLs: Maximal assistance;Total assistance        Vision Baseline Vision/History: Wears glasses Wears Glasses: At all times Patient Visual Report: No change from baseline       Perception     Praxis      Pertinent Vitals/Pain Pain Assessment: Faces Faces Pain Scale: Hurts even more Pain Location: L knee Pain Descriptors / Indicators: Discomfort;Grimacing;Guarding;Sore;Sharp Pain Intervention(s): Monitored during session;Repositioned     Hand Dominance     Extremity/Trunk Assessment Upper Extremity Assessment Upper Extremity Assessment: Overall WFL for tasks assessed   Lower Extremity Assessment Lower Extremity Assessment: LLE deficits/detail LLE Deficits / Details: Limited ROM secondary to pain    Cervical / Trunk Assessment Cervical / Trunk Assessment: Other exceptions Cervical / Trunk Exceptions: body habitus   Communication Communication Communication: No difficulties   Cognition Arousal/Alertness: Awake/alert Behavior During Therapy: WFL for tasks assessed/performed Overall Cognitive Status: Within Functional Limits for tasks assessed                                     General Comments       Exercises     Shoulder Instructions      Home Living Family/patient expects to be discharged to:: Private residence Living Arrangements: Other relatives (sister) Available Help at Discharge: Family;Available PRN/intermittently Type of Home: House Home Access: Level entry;Stairs to enter Entrance Stairs-Number of Steps: 1   Home Layout: One level     Bathroom Shower/Tub: Other (comment) (does not get in bath/shower)   Bathroom Toilet: Standard     Home Equipment: Wheelchair - manual;Wheelchair - power;Hospital bed          Prior Functioning/Environment Level of Independence: Independent with assistive device(s)        Comments: pt able to ambulate short distances without any AD; uses a w/c for community level mobility        OT Problem List: Decreased activity  tolerance;Impaired balance (sitting and/or standing);Obesity;Pain      OT Treatment/Interventions: Self-care/ADL training;Therapeutic exercise;Therapeutic activities;Patient/family education;Balance training    OT Goals(Current goals can be found in the care plan section) Acute Rehab OT Goals Patient Stated Goal: to get knee to have less pain, be able to walk again OT Goal Formulation: With patient Time For Goal Achievement: 10/23/19 Potential to Achieve Goals: Good ADL Goals Pt Will Perform Grooming: with modified independence;sitting Pt Will Perform Upper Body Dressing: with modified independence;sitting Pt Will Perform Lower Body Dressing: with supervision;sitting/lateral leans;with adaptive equipment Pt Will Transfer to Toilet: with mod assist;squat pivot transfer;bedside commode Pt Will Perform Toileting - Clothing Manipulation and hygiene: with supervision;sitting/lateral leans  OT Frequency: Min 2X/week   Barriers to D/C: Decreased caregiver support  Pt lives with sister who is unable to assist       Co-evaluation PT/OT/SLP Co-Evaluation/Treatment: Yes Reason for Co-Treatment: For patient/therapist safety;To address functional/ADL transfers PT goals addressed during session: Balance;Mobility/safety with mobility OT goals addressed during session: ADL's and self-care;Strengthening/ROM      AM-PAC OT "6 Clicks" Daily Activity     Outcome Measure Help from another person eating meals?: None Help from another person taking care of  personal grooming?: A Little Help from another person toileting, which includes using toliet, bedpan, or urinal?: A Lot Help from another person bathing (including washing, rinsing, drying)?: A Lot Help from another person to put on and taking off regular upper body clothing?: A Little Help from another person to put on and taking off regular lower body clothing?: A Lot 6 Click Score: 16   End of Session Equipment Utilized During Treatment: Gait  belt Nurse Communication: Mobility status  Activity Tolerance: Patient tolerated treatment well Patient left: in bed;with call bell/phone within reach  OT Visit Diagnosis: Unsteadiness on feet (R26.81);Other abnormalities of gait and mobility (R26.89);Muscle weakness (generalized) (M62.81);Pain Pain - Right/Left: Left Pain - part of body: Knee                Time: 7460-0298 OT Time Calculation (min): 40 min Charges:  OT General Charges $OT Visit: 1 Visit OT Evaluation $OT Eval Moderate Complexity: [place] OTR/L Acute Rehabilitation Services Pager: [phone] Office: [place] 10/09/2019, 1:15 PM

## 2019-10-09 NOTE — Progress Notes (Signed)
[facility] for Infectious Disease  Date of Admission:  10/03/2019      Lines:  Right groin HD  Abx: 10/7-c ceftazidime 10/2-c vanc  10/6 ceftriaxone 10/02-10/6 piptazo 2.25 q8hour                                                    Assessment: Renn Dirocco is a 54 y.o. male spina bifida, myelomeningocele, neurogenic bladder s/p ileal conduit, s/p ileostomy, httn/hlp/PAD, esrd, very difficult dialysis access situation, chart hx iliac artery stent, GERD, bicuspid valve with severe AS, OA, hx native MV endocarditis (MSSA), hx left renal abscess (fusobacterium), admitted for rle vascular graft site infection, course complicated by swollen right knee presumed septic arthritis   Patient appears to have had failed vascular graft left thigh that had been infected for at least several weeks. He was given some abx in the dialysis clinic a few days prior to admission. Culture of the graft material/surrouding tissue only grow corynebacterium species. He subsequently had acute monoarthritis of the contiguous left knee (operative cx on abx negative; no crystal).   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 Also various gram positive/negative on gram stain of left thigh wound in the past  In that setting, I would treat with 1 week of vancomycin to cover for the corynebacterium species (now that the graft is out), and have him on levofloxacin for probable gram negative coverage as he received prior abx and cx could have been negative due to that. After 1 week of vancomycin, would continue tx with doxycycline and levofloxacin to finish off total of 4 week abx treatment. This focuses on the deep skin soft tissue infection associated with the graft and probable left knee septic arthritis  He had nonspecific chest pain, a tte without obvious vegeation and negative blood culture, so not sure what to make of the chest pain but perhaps  multifactorial including severe AS and some musculoskeletal component. Perhaps also an esophageal component. Should be f/u'ed by cardiology  On a final note, I am unsure if his left renal-percutaneous sinus tract will become reinfected. If it does he might need the tract removal. He already is at high risk of HD access failure, and would like to avoid source of infection that could affect the HD access  He had prior iliac stent per chart but off abx after his MV endocarditis seem to do fine, so will not place on suppressive abx course   Plan: 1. continue vancomycin per pharmacy, until 10/12 2. Patient's memory of norfloxacin allergy is vague; will do mini oral challenge with levofloxacin here 3. Given he is on dialysis, will switch ceftriaxone to ceftazadime 2 gram tiw after dialsyis 4. On 10/12 will continue ceftazidime 2 gram iv tiw after dialysis, and start doxycycline for another 3 weeks until 11/02 5. Will make patient f/u appointment with me in ID clinic in 2-4 weeks  Active Problems:   ESRD (end stage renal disease) ([place])   Complication associated with dialysis catheter   Hyperkalemia   Dysphagia   Pain and swelling of knee, left   Pyogenic arthritis of left knee joint (H[facility])   Wound infection   Sepsis without acute organ dysfunction ([place])   Vascular graft infection ([place])   Scheduled Meds: . acetaminophen  1,000  mg Oral Q8H  . aspirin  325 mg Oral Daily  . calcium acetate  2,001 mg Oral With snacks  . calcium acetate  2,668 mg Oral TID with meals  . Chlorhexidine Gluconate Cloth  6 each Topical Q0600  . clopidogrel  75 mg Oral Daily  . docusate sodium  100 mg Oral BID  . doxercalciferol  3 mcg Intravenous Q M,W,F-HD  . [START ON 10/10/2019] famotidine  20 mg Oral Daily  . heparin  5,000 Units Subcutaneous Q8H  . levothyroxine  137 mcg Oral QAC breakfast  . lidocaine  2 patch Transdermal Daily  . pantoprazole  40 mg Oral QAC breakfast  . sodium polystyrene  15 g Oral  Once per day on Sun Sat   Continuous Infusions: . cefTRIAXone (ROCEPHIN)  IV 2 g (10/08/19 1854)  . vancomycin Stopped (10/08/19 1122)   PRN Meds:.HYDROmorphone (DILAUDID) injection, Muscle Rub, ondansetron **OR** ondansetron (ZOFRAN) IV, [DISCONTINUED] oxyCODONE-acetaminophen **AND** oxyCODONE, promethazine, zolpidem   SUBJECTIVE: Doing well no abx side effect Doesn't remember his childhood allergy to norflox No n/v/diarrhea/fever, chill hds No issue with right LE dialysis line  Review of Systems: ROS  Allergies  Allergen Reactions  . Hydrocodone Other (See Comments)    Caused involuntary movement and twitching. CANNOT TAKE DUE TO MUSCLE SPASMS AND MUSCLE TREMORS  . Furadantin [Nitrofurantoin] Other (See Comments)    UNSPECIFIED REACTION   . Mandelamine [Methenamine] Other (See Comments)    UNSPECIFIED REACTION   . Noroxin [Norfloxacin] Other (See Comments)    UNSPECIFIED REACTION   . Sulfa Antibiotics Other (See Comments) and Cough    Childhood reaction - pt could not confirm that it was a cough  . Sulfur Cough    Childhood reaction - pt could not confirm that it was a cough    OBJECTIVE: Vitals:   10/08/19 2037 10/08/19 2154 10/09/19 0521 10/09/19 0831  BP: (!) 86/45 117/62 (!) 103/54 116/67  Pulse: 83 84 74 82  Resp: 17  18 18   Temp: 99.6 F (37.6 C)  99 F (37.2 C) 97.9 F (36.6 C)  TempSrc: Oral  Oral   SpO2: 100%  98% 100%  Weight:      Height:       Body mass index is 35.51 kg/m.  Physical Exam  Alert/oriented; no distress Normocephalic per conj clear cv rrr no mrg Lungs clear abd s/nt; ileal conduit bag with clear urine Ext atrophic Skin left thigh/knee in dressign c/d/i; left flank sinus tract no purulence msk good passive rom left knee Neuro nonfocal  Lab Results Lab Results  Component Value Date   WBC 7.8 10/08/2019   HGB 7.9 (L) 10/08/2019   HCT 27.4 (L) 10/08/2019   MCV 98.6 10/08/2019   PLT 129 (L) 10/08/2019    Lab Results    Component Value Date   CREATININE 6.72 (H) 10/09/2019   BUN 45 (H) 10/09/2019   NA 135 10/09/2019   K 4.6 10/09/2019   CL 94 (L) 10/09/2019   CO2 25 10/09/2019    Lab Results  Component Value Date   ALT 9 03/20/2019   AST 13 (L) 03/20/2019   ALKPHOS 79 03/20/2019   BILITOT 0.6 03/20/2019     Microbiology: Recent Results (from the past 240 hour(s))  Resp Panel by RT PCR (RSV, Flu A&B, Covid) - Nasopharyngeal Swab     Status: None   Collection Time: 10/03/19  1:18 PM   Specimen: Nasopharyngeal Swab  Result Value Ref Range Status  SARS Coronavirus 2 by RT PCR NEGATIVE NEGATIVE Final    Comment: (NOTE) SARS-CoV-2 target nucleic acids are NOT DETECTED.  The SARS-CoV-2 RNA is generally detectable in upper respiratoy specimens during the acute phase of infection. The lowest concentration of SARS-CoV-2 viral copies this assay can detect is 131 copies/mL. A negative result does not preclude SARS-Cov-2 infection and should not be used as the sole basis for treatment or other patient management decisions. A negative result may occur with  improper specimen collection/handling, submission of specimen other than nasopharyngeal swab, presence of viral mutation(s) within the areas targeted by this assay, and inadequate number of viral copies (<131 copies/mL). A negative result must be combined with clinical observations, patient history, and epidemiological information. The expected result is Negative.  Fact Sheet for Patients:  PinkCheek.be  Fact Sheet for Healthcare Providers:  GravelBags.it  This test is no t yet approved or cleared by the [place] FDA and  has been authorized for detection and/or diagnosis of SARS-CoV-2 by FDA under an Emergency Use Authorization (EUA). This EUA will remain  in effect (meaning this test can be used) for the duration of the COVID-19 declaration under Section 564(b)(1) of the Act, 21  U.S.C. section 360bbb-3(b)(1), unless the authorization is terminated or revoked sooner.     Influenza A by PCR NEGATIVE NEGATIVE Final   Influenza B by PCR NEGATIVE NEGATIVE Final    Comment: (NOTE) The Xpert Xpress SARS-CoV-2/FLU/RSV assay is intended as an aid in  the diagnosis of influenza from Nasopharyngeal swab specimens and  should not be used as a sole basis for treatment. Nasal washings and  aspirates are unacceptable for Xpert Xpress SARS-CoV-2/FLU/RSV  testing.  Fact Sheet for Patients: PinkCheek.be  Fact Sheet for Healthcare Providers: GravelBags.it  This test is not yet approved or cleared by the [place] FDA and  has been authorized for detection and/or diagnosis of SARS-CoV-2 by  FDA under an Emergency Use Authorization (EUA). This EUA will remain  in effect (meaning this test can be used) for the duration of the  Covid-19 declaration under Section 564(b)(1) of the Act, 21  U.S.C. section 360bbb-3(b)(1), unless the authorization is  terminated or revoked.    Respiratory Syncytial Virus by PCR NEGATIVE NEGATIVE Final    Comment: (NOTE) Fact Sheet for Patients: PinkCheek.be  Fact Sheet for Healthcare Providers: GravelBags.it  This test is not yet approved or cleared by the [place] FDA and  has been authorized for detection and/or diagnosis of SARS-CoV-2 by  FDA under an Emergency Use Authorization (EUA). This EUA will remain  in effect (meaning this test can be used) for the duration of the  COVID-19 declaration under Section 564(b)(1) of the Act, 21 U.S.C.  section 360bbb-3(b)(1), unless the authorization is terminated or  revoked. Performed at [facility], [place] [address] [place], [place] [address]   Surgical pcr screen     Status: None   Collection Time: 10/03/19 10:24 PM   Specimen: Nasal Mucosa; Nasal Swab  Result Value  Ref Range Status   MRSA, PCR NEGATIVE NEGATIVE Final   Staphylococcus aureus NEGATIVE NEGATIVE Final    Comment: (NOTE) The Xpert SA Assay (FDA approved for NASAL specimens in patients 63 years of age and older), is one component of a comprehensive surveillance program. It is not intended to diagnose infection nor to guide or monitor treatment. Performed at [facility], [place] [address] [place], [place] [address]   Aerobic/Anaerobic Culture (surgical/deep wound)  Status: None (Preliminary result)   Collection Time: 10/04/19  8:39 AM   Specimen: Wound; Body Fluid  Result Value Ref Range Status   Specimen Description WOUND  Final   Special Requests LEFT THIGH SPEC  Final   Gram Stain   Final    NO WBC SEEN RARE GRAM POSITIVE RODS Performed at [facility], [address], [place], [place] [address]    Culture   Final    FEW CORYNEBACTERIUM STRIATUM Standardized susceptibility testing for this organism is not available. NO ANAEROBES ISOLATED; CULTURE IN PROGRESS FOR 5 DAYS    Report Status PENDING  Incomplete  Culture, blood (routine x 2)     Status: None (Preliminary result)   Collection Time: 10/06/19  1:47 AM   Specimen: BLOOD LEFT HAND  Result Value Ref Range Status   Specimen Description BLOOD LEFT HAND  Final   Special Requests   Final    BOTTLES DRAWN AEROBIC AND ANAEROBIC Blood Culture results may not be optimal due to an inadequate volume of blood received in culture bottles   Culture   Final    NO GROWTH 2 DAYS Performed at [facility], [place] [address] [place], [place] [address]    Report Status PENDING  Incomplete  Culture, blood (routine x 2)     Status: None (Preliminary result)   Collection Time: 10/06/19  1:52 AM   Specimen: BLOOD  Result Value Ref Range Status   Specimen Description BLOOD RIGHT ANTECUBITAL  Final   Special Requests   Final    BOTTLES DRAWN AEROBIC ONLY Blood Culture adequate volume   Culture   Final    NO GROWTH 2  DAYS Performed at [facility], [place] [address], [place], [place] [address]    Report Status PENDING  Incomplete  Body fluid culture     Status: None   Collection Time: 10/06/19  3:06 AM   Specimen: Synovial Fluid  Result Value Ref Range Status   Specimen Description FLUID SYNOVIAL LEFT KNEE  Final   Special Requests NONE  Final   Gram Stain   Final    MANY WBC PRESENT, PREDOMINANTLY PMN NO ORGANISMS SEEN    Culture   Final    NO GROWTH 3 DAYS Performed at [facility], [place] [address], [place], [place] [address]    Report Status 10/09/2019 FINAL  Final  Anaerobic culture     Status: None (Preliminary result)   Collection Time: 10/06/19  3:06 AM   Specimen: Synovial Fluid  Result Value Ref Range Status   Specimen Description FLUID SYNOVIAL LEFT KNEE  Final   Special Requests NONE  Final   Culture   Final    NO GROWTH 2 DAYS NO ANAEROBES ISOLATED; CULTURE IN PROGRESS FOR 5 DAYS Performed at [facility], [place] [address] [place], [place] [address]    Report Status PENDING  Incomplete  Surgical pcr screen     Status: None   Collection Time: 10/07/19  1:29 AM   Specimen: Nasal Mucosa; Nasal Swab  Result Value Ref Range Status   MRSA, PCR NEGATIVE NEGATIVE Final   Staphylococcus aureus NEGATIVE NEGATIVE Final    Comment: (NOTE) The Xpert SA Assay (FDA approved for NASAL specimens in patients 67 years of age and older), is one component of a comprehensive surveillance program. It is not intended to diagnose infection nor to guide or monitor treatment. Performed at [facility], [place]  [address] [place], [place] [address]   Aerobic/Anaerobic Culture (surgical/deep wound)     Status: None (Preliminary result)   Collection Time: 10/07/19  3:17 PM   Specimen: Joint, Other; Body Fluid  Result Value Ref Range Status   Specimen Description FLUID  Final   Special Requests LEFT KNEE JOINT SPEC A  Final   Gram Stain   Final    MODERATE WBC PRESENT,BOTH PMN AND  MONONUCLEAR NO ORGANISMS SEEN    Culture   Final    NO GROWTH 2 DAYS Performed at [facility], [address], [place], [place] [address]    Report Status PENDING  Incomplete    Serology: n/a   Jabier Mutton, [place] [facility] [place] [phone] pager    10/09/2019, 1:02 PM

## 2019-10-09 NOTE — Progress Notes (Signed)
[facility] Progress Note   Subjective:   Seen in room. Completed dialysis yesterday net UF 2.5L  Feels "terrible" this morning. Didn't sleep and continued knee pain.    Objective Vitals:   10/08/19 2037 10/08/19 2154 10/09/19 0521 10/09/19 0831  BP: (!) 86/45 117/62 (!) 103/54 116/67  Pulse: 83 84 74 82  Resp: 17  18 18   Temp: 99.6 F (37.6 C)  99 F (37.2 C) 97.9 F (36.6 C)  TempSrc: Oral  Oral   SpO2: 100%  98% 100%  Weight:      Height:       Physical Exam General: WNWD male, lying in bed, nad  Heart: RRR; 3/6 murmur Lungs: Clear bilaterally  Abdomen: soft, non-tender Extremities: Trace BLE edema; L knee bandaged  Dialysis Access: R thigh TDC, L thigh with bandage in place.  Additional Objective Labs: Basic Metabolic Panel: Recent Labs  Lab 10/08/19 0115 10/08/19 0748 10/09/19 0156  NA 132* 132* 135  K 5.4* 6.2* 4.6  CL 97* 95* 94*  CO2 20* 22 25  GLUCOSE 197* 103* 107*  BUN 60* 64* 45*  CREATININE 8.80* 8.78* 6.72*  CALCIUM 8.7* 9.0 9.5  PHOS 4.3 8.6* 5.2*   Liver Function Tests: Recent Labs  Lab 10/08/19 0115 10/08/19 0748 10/09/19 0156  ALBUMIN 3.0* 2.9* 3.1*   CBC: Recent Labs  Lab 10/03/19 0742 10/03/19 0742 10/06/19 0152 10/06/19 0152 10/07/19 0430 10/08/19 0115 10/08/19 0739  WBC 6.4   < > 9.8   < > 5.4 6.0 7.8  NEUTROABS  --   --  8.5*  --   --   --   --   HGB 10.1*   < > 8.9*   < > 8.2* 7.9* 7.9*  HCT 34.5*   < > 30.8*   < > 28.4* 27.3* 27.4*  MCV 98.6  --  98.1  --  98.6 99.6 98.6  PLT 170   < > 159   < > 132* 123* 129*   < > = values in this interval not displayed.   Medications: . cefTRIAXone (ROCEPHIN)  IV 2 g (10/08/19 1854)  . methocarbamol (ROBAXIN) IV    . vancomycin Stopped (10/08/19 1122)   . Chlorhexidine Gluconate Cloth  6 each Topical Q0600  . docusate sodium  100 mg Oral BID  . doxercalciferol  3 mcg Intravenous Q M,W,F-HD  . famotidine  20 mg Oral Daily  . heparin  5,000 Units Subcutaneous  Q8H  . levothyroxine  137 mcg Oral QAC breakfast  . lidocaine  2 patch Transdermal Daily  . pantoprazole  40 mg Oral QAC breakfast  . sodium polystyrene  15 g Oral Once per day on Sun Sat    [facility] Optiflux 200NRe, Time: 4hr 96min, BFR 350, DFR 800, EDW 114.5kg, 2K/2.,25Ca, TDC heparin 3700 unit bolus Mircera 291mcg IV q 2 weeks- last dose 9/27 Venofer 100mg  q HD -last dose due today Phoslo 4 caps TO TID with meals and 3 with snacks Hectorol 3 mcg IV q HD Kayexalate 15g PO on non-dialysis days   Assessment/Plan: 1. ESRD:HD MWF.  S/p [name] manipulation by Dr. Trula Slade 10/2 as well as L thigh debridement with old AVG removal. TDC seems to be running decently since. Limited access options.  Next HD 10/8.  2. Hyperkalemia: Recurrent issue - pt takes kayexalate on non-dialysis today. 3. Fevers. Blood cultures neg to date.  Possible sources of infection include: dialysis catheter, left thigh wound,  septic knee  -- Cultures neg to date. IV antibiotics per primary team  4. L thigh wound/infected old AVG: S/p excision/debridement 10/2 - wound vac placed - removed d/t dysfunction.  5. L knee pain/effusion - s/p joint aspiration 10/4. Concern for septic arthrits 25K WBCs in synovial fluid. To OR for I &D 10/5 --Cultures neg to date.  6. Hypertension/volume:BP low/stable. Still a bit above EDW - no dyspnea.  7. Anemia:Hgb 8.9>7.9. ESA dosed 9/27, follow trends. 8. Metabolic bone disease:Ca/Phos ok. Continue hectorol and phoslo. 9. Nutrition:Renal diet with 1.2L fluid restriction recommended 10. L flank wound: D/t prior PCN tube - some erythema, no drainage - pt reports this is improved, follow closely. 11. Dispo: Pending   Lynnda Child PA-C [facility] 10/09/2019,10:00 AM

## 2019-10-09 NOTE — Progress Notes (Signed)
10/09/19 1436  PT Visit Information  Last PT Received On 10/09/19  Assistance Needed +2  PT/OT/SLP Co-Evaluation/Treatment Yes  Reason for Co-Treatment To address functional/ADL transfers;For patient/therapist safety  PT goals addressed during session Mobility/safety with mobility;Balance  History of Present Illness [name] is a 54 y.o. male presenting with inability to obtain access at his right femoral dialysis catheter while at HD, now with acute knee pain and swelling with fever concerning for septic arthritis. PMH is significant for ESRD on HD (MWF), PAD, kidney stones, spina bifida, hx of nephrostomy tube, HTN, GERD, anxiety 10/1 Admitted, HD attempted (terminated early due to pain); 10/2Inferior venacavogram(right), I&D of left thigh with graft excision, HD, 10/4 Joint aspiration left knee; 10/5 Arthroscopic left knee irrigation and drainage with placement of antibiotic beads.   Precautions  Precautions Fall  Restrictions  Weight Bearing Restrictions No  Home Living  Family/patient expects to be discharged to: Private residence  Living Arrangements Other relatives (sister)  Available Help at Discharge Family;Available PRN/intermittently  Type of Home House  Home Access Level entry;Stairs to enter  Entrance Stairs-Number of Steps 1  Home Layout One level  Bathroom Shower/Tub Other (comment) (does not get in bath/shower)  Gaffer - manual;Wheelchair - power;Hospital bed  Prior Function  Level of Independence Independent with assistive device(s)  Comments pt able to ambulate short distances without any AD; uses a w/c for community level mobility  Communication  Communication No difficulties  Pain Assessment  Pain Assessment Faces  Faces Pain Scale 6  Pain Location L knee  Pain Descriptors / Indicators Discomfort;Grimacing;Guarding;Sore;Sharp  Pain Intervention(s) Limited activity within patient's tolerance;Monitored  during session;Repositioned  Cognition  Arousal/Alertness Awake/alert  Behavior During Therapy WFL for tasks assessed/performed  Overall Cognitive Status Within Functional Limits for tasks assessed  Upper Extremity Assessment  Upper Extremity Assessment Defer to OT evaluation  Lower Extremity Assessment  Lower Extremity Assessment LLE deficits/detail;RLE deficits/detail  RLE Deficits / Details Has R tunneled HD catheter placement   LLE Deficits / Details Limited ROM at knee secondary to pain. was unable to perform LAQ  Cervical / Trunk Assessment  Cervical / Trunk Assessment Normal  Bed Mobility  Overal bed mobility Needs Assistance  Bed Mobility Supine to Sit;Sit to Supine  Supine to sit Supervision;HOB elevated (use of rail)  Sit to supine Min assist (for management of LLE back into bed)  General bed mobility comments LLE painful, Pt able to hook with RLE and move OOB, use of rails. Pt requiring min A for LLE assist back into bed.   Transfers  Overall transfer level Needs assistance  Equipment used 2 person hand held assist  Transfers Sit to/from Stand;Lateral/Scoot Transfers  Sit to Stand Max assist;Total assist;+2 physical assistance   Lateral/Scoot Transfers Mod assist  General transfer comment Pt with minimal clearance of buttocks off the bed with max to total A +2.  Pt able to assist with lateral scoot up the bed, limited by pain and weakness in LLE  Balance  Overall balance assessment Needs assistance  Sitting-balance support Bilateral upper extremity supported;Feet supported  Sitting balance-Leahy Scale Fair  Sitting balance - Comments dependent on BUE but no external support needed  Standing balance support Bilateral upper extremity supported  Standing balance-Leahy Scale Zero  Standing balance comment unable to achieve standing at this time  PT - End of Session  Equipment Utilized During Treatment Gait belt  Activity Tolerance Patient limited by pain  Patient left in  bed;with call bell/phone within reach  Nurse Communication Mobility status  PT Assessment  PT Recommendation/Assessment Patient needs continued PT services  PT Visit Diagnosis Unsteadiness on feet (R26.81);Difficulty in walking, not elsewhere classified (R26.2);Pain;Muscle weakness (generalized) (M62.81)  Pain - Right/Left Left  Pain - part of body Knee  PT Problem List Decreased strength;Decreased range of motion;Decreased activity tolerance;Decreased balance;Decreased mobility;Decreased knowledge of use of DME;Decreased knowledge of precautions;Pain  PT Plan  PT Frequency (ACUTE ONLY) Min 2X/week  PT Treatment/Interventions (ACUTE ONLY) Gait training;DME instruction;Functional mobility training;Therapeutic activities;Therapeutic exercise;Balance training;Patient/family education  AM-PAC PT "6 Clicks" Mobility Outcome Measure (Version 2)  Help needed turning from your back to your side while in a flat bed without using bedrails? 3  Help needed moving from lying on your back to sitting on the side of a flat bed without using bedrails? 3  Help needed moving to and from a bed to a chair (including a wheelchair)? 1  Help needed standing up from a chair using your arms (e.g., wheelchair or bedside chair)? 1  Help needed to walk in hospital room? 1  Help needed climbing 3-5 steps with a railing?  1  6 Click Score 10  Consider Recommendation of Discharge To: CIR/SNF/LTACH  PT Recommendation  Follow Up Recommendations SNF  PT equipment Other (comment) (TBD)  Individuals Consulted  Consulted and Agree with Results and Recommendations Patient  Acute Rehab PT Goals  Patient Stated Goal to get knee to have less pain, be able to walk again  PT Goal Formulation With patient  Time For Goal Achievement 10/23/19  Potential to Achieve Goals Good  PT Time Calculation  PT Start Time (ACUTE ONLY) 1203  PT Stop Time (ACUTE ONLY) 1243  PT Time Calculation (min) (ACUTE ONLY) 40 min  PT General Charges   $$ ACUTE PT VISIT 1 Visit  PT Evaluation  $PT Eval Moderate Complexity 1 Mod  PT Treatments  $Therapeutic Activity 8-22 mins   Pt admitted secondary to problem above with deficits above. Pt with increased L knee pain which limited mobility tolerance this session. Pt requiring supervision to min A with bed mobility tasks. Attempted to stand with +2 max-total A, however, only achieved minimal clearance of hips. Unable to tolerate further secondary to pain. Was able to perform lateral scoot with mod A. Feel pt will benefit from SNF level therapies prior to return home. Will continue to follow acutely.   Reuel Derby, PT, DPT  Acute Rehabilitation Services  Pager: [phone] Office: [phone]

## 2019-10-09 NOTE — Progress Notes (Signed)
Family Medicine Teaching Service Daily Progress Note Intern Pager: [phone]  Patient name: [name] Medical record number: [id number] Date of birth: [birth year] Age: 54 y.o. Gender: male  Primary Care Provider: [name], MD Consultants: Nephrology, vascular surgery, orthopedics   Code Status: Full Code  Pt Overview and Major Events to Date:  10/1 Admitted, HD attempted (terminated early due to pain) 10/2Inferior venacavogram(right), I&D of left thigh with graft excision, HD 10/4 Joint aspiration left knee 10/5 Arthroscopic left knee irrigation and drainage with placement of antibiotic beads  Assessment and Plan: [name] is a 54 y.o. male presenting with inability to obtain access at his right femoral dialysis catheter while at HD, now with acute knee pain and swelling with fever concerning for septic arthritis. PMH is significant for ESRD on HD (MWF), PAD, kidney stones, spina bifida, hx of nephrostomy tube, HTN, GERD, anxiety.  Dialysis access issue Now resolved s/p inferior venacavogram.  Left thigh wound S/p I&D of left thigh with graft excision 10/2.  Wound VAC placed twice, was not working properly. Wound culture growing few corynebacterium striatum. ID consulted, appreciate recommendations. - wet to dry dressing changes BID - antibiotics as below  Acute left knee pain Concerning for septic arthritis s/p joint aspiration and arthroscopic left knee irrigation and drainage with placement of antibiotic beads. Synovial fluid with 97W WBC (neutrophilic predominance), though no organisms seen on Gram stain.  No crystals seen on synovial fluid.  Cultures pending, no growth so far. Orthopedics following, appreciate involvement.  - oxycodone 5 mg q6 prn - oxycodone-acetaminophen 5-325 mg q6 prn - IV hydromorphone 0.5 mg q4 prn - f/u cultures  Fever Unclear etiology but concern for septic arthritis possible given acute onset of left knee pain and swelling. As  above, wound culture growing few corynebacterium striatum. ID following, appreciate involvement.  ID recommending 1 week of vancomycin with levofloxacin, and continue with doxycycline and levofloxacin for 4 total weeks of antibiotic treatment.  Due to allergy to levofloxacin listed in chart, patient started on CTX instead.  Patient states his reaction to norfloxacin was when he was a child and does not remember what his reaction was. Last fever 10/4.  - s/p piperacillin-tazobactam (10/4-10/6) - vancomycin MWF with HD (10/4-) - CTX (10/6-) - Tylenol prn - f/u blood cultures - f/u synovial fluid cultures - f/u left thigh wound cultures   ESRD MWF dialysis, continue while inpatient per nephrology.  Dysphagia Chronic, worse over past week. - SLP, may get FEES  Aortic stenosis Severe AS noted on echo this admission.  Likely contributing to his symptoms of chest pain and shortness of breath. - outpatient follow-up (sees Dr. Angelena Keith)  FEN/GI: renal diet PPx: SCDs  Disposition: med-surg  Subjective:  Patient still reporting severe left knee pain, had not received his Dilaudid this morning yet.  Discussed overall plan with patient, he was in agreement.  Stated he takes aspirin 325 mg and clopidogrel at home to prevent clots in his dialysis catheter.  Asked about norfloxacin allergy, patient states he was a child and does not remember what kind of reaction he had.  Objective: Temp:  [97.7 F (36.5 C)-99.6 F (37.6 C)] 97.9 F (36.6 C) (10/07 0831) Pulse Rate:  [64-84] 82 (10/07 0831) Resp:  [16-18] 18 (10/07 0831) BP: (75-117)/(39-67) 116/67 (10/07 0831) SpO2:  [98 %-100 %] 100 % (10/07 0831) Weight:  [115.5 kg] 115.5 kg (10/06 1154) Physical Exam: General:Obese male, appears uncomfortable, NAD Cardiovascular:RRR, 3/6 systolic murmur best heard at LUSB Respiratory:CTAB  Abdomen:soft, +BS Extremities:WWP, left lower extremity with bandage in place, left knee ROM limited by  pain    Laboratory: Recent Labs  Lab 10/07/19 0430 10/08/19 0115 10/08/19 0739  WBC 5.4 6.0 7.8  HGB 8.2* 7.9* 7.9*  HCT 28.4* 27.3* 27.4*  PLT 132* 123* 129*   Recent Labs  Lab 10/08/19 0115 10/08/19 0748 10/09/19 0156  NA 132* 132* 135  K 5.4* 6.2* 4.6  CL 97* 95* 94*  CO2 20* 22 25  BUN 60* 64* 45*  CREATININE 8.80* 8.78* 6.72*  CALCIUM 8.7* 9.0 9.5  GLUCOSE 197* 103* 107*    Imaging/Diagnostic Tests: No new imaging.  Darrell Button, MD 10/09/2019, 9:06 AM PGY-1, [place] Intern pager: [phone], text pages welcome

## 2019-10-10 DIAGNOSIS — T148XXA Other injury of unspecified body region, initial encounter: Secondary | ICD-10-CM | POA: Diagnosis not present

## 2019-10-10 DIAGNOSIS — T827XXD Infection and inflammatory reaction due to other cardiac and vascular devices, implants and grafts, subsequent encounter: Secondary | ICD-10-CM | POA: Diagnosis not present

## 2019-10-10 DIAGNOSIS — M009 Pyogenic arthritis, unspecified: Secondary | ICD-10-CM | POA: Diagnosis not present

## 2019-10-10 DIAGNOSIS — L089 Local infection of the skin and subcutaneous tissue, unspecified: Secondary | ICD-10-CM | POA: Diagnosis not present

## 2019-10-10 LAB — CBC
HCT: 27.1 % — ABNORMAL LOW (ref 39.0–52.0)
Hemoglobin: 7.8 g/dL — ABNORMAL LOW (ref 13.0–17.0)
MCH: 28.4 pg (ref 26.0–34.0)
MCHC: 28.8 g/dL — ABNORMAL LOW (ref 30.0–36.0)
MCV: 98.5 fL (ref 80.0–100.0)
Platelets: 164 10*3/uL (ref 150–400)
RBC: 2.75 MIL/uL — ABNORMAL LOW (ref 4.22–5.81)
RDW: 17.7 % — ABNORMAL HIGH (ref 11.5–15.5)
WBC: 5.5 10*3/uL (ref 4.0–10.5)
nRBC: 0 % (ref 0.0–0.2)

## 2019-10-10 LAB — RENAL FUNCTION PANEL
Albumin: 2.7 g/dL — ABNORMAL LOW (ref 3.5–5.0)
Anion gap: 12 (ref 5–15)
BUN: 69 mg/dL — ABNORMAL HIGH (ref 6–20)
CO2: 25 mmol/L (ref 22–32)
Calcium: 9.3 mg/dL (ref 8.9–10.3)
Chloride: 98 mmol/L (ref 98–111)
Creatinine, Ser: 8.74 mg/dL — ABNORMAL HIGH (ref 0.61–1.24)
GFR calc non Af Amer: 6 mL/min — ABNORMAL LOW (ref >60)
Glucose, Bld: 108 mg/dL — ABNORMAL HIGH (ref 70–99)
Phosphorus: 5.6 mg/dL — ABNORMAL HIGH (ref 2.5–4.6)
Potassium: 5 mmol/L (ref 3.5–5.1)
Sodium: 135 mmol/L (ref 135–145)

## 2019-10-10 LAB — HEPATIC FUNCTION PANEL
ALT: 6 U/L (ref 0–44)
AST: 7 U/L — ABNORMAL LOW (ref 15–41)
Albumin: 2.7 g/dL — ABNORMAL LOW (ref 3.5–5.0)
Alkaline Phosphatase: 56 U/L (ref 38–126)
Bilirubin, Direct: <0.1 mg/dL (ref 0.0–0.2)
Total Bilirubin: 0.3 mg/dL (ref 0.3–1.2)
Total Protein: 7.3 g/dL (ref 6.5–8.1)

## 2019-10-10 LAB — VANCOMYCIN, RANDOM: Vancomycin Rm: 24

## 2019-10-10 MED ORDER — PROMETHAZINE HCL 25 MG/ML IJ SOLN: 12.5000 mg | Freq: Once | INTRAMUSCULAR | Status: AC

## 2019-10-10 MED ORDER — TRAZODONE HCL 100 MG PO TABS
100.0000 mg | ORAL_TABLET | Freq: Every evening | ORAL | Status: DC | PRN
  Administered 2019-10-10 – 2019-10-17 (×7): 100 mg via ORAL
  Filled 2019-10-10 (×9): qty 1

## 2019-10-10 MED ORDER — ALTEPLASE 2 MG IJ SOLR
INTRAMUSCULAR | Status: AC
  Filled 2019-10-10: qty 4

## 2019-10-10 MED ORDER — ALTEPLASE 2 MG IJ SOLR
INTRAMUSCULAR | Status: AC
  Administered 2019-10-10: 2 mg
  Filled 2019-10-10: qty 6

## 2019-10-10 MED ORDER — PROMETHAZINE HCL 25 MG/ML IJ SOLN
INTRAMUSCULAR | Status: AC
  Administered 2019-10-10: 12.5 mg via INTRAVENOUS
  Filled 2019-10-10: qty 1

## 2019-10-10 MED ORDER — LEVOFLOXACIN 250 MG PO TABS
250.0000 mg | ORAL_TABLET | Freq: Once | ORAL | Status: AC
  Administered 2019-10-10: 250 mg via ORAL
  Filled 2019-10-10: qty 1

## 2019-10-10 MED ORDER — HEPARIN SODIUM (PORCINE) 1000 UNIT/ML IJ SOLN
INTRAMUSCULAR | Status: AC
  Administered 2019-10-10: 1000 [IU]
  Filled 2019-10-10: qty 3

## 2019-10-10 MED ORDER — DARBEPOETIN ALFA 200 MCG/0.4ML IJ SOSY
200.0000 ug | PREFILLED_SYRINGE | INTRAMUSCULAR | Status: DC
  Filled 2019-10-10: qty 0.4

## 2019-10-10 MED ORDER — EPINEPHRINE 0.3 MG/0.3ML IJ SOAJ
0.3000 mg | INTRAMUSCULAR | Status: DC | PRN
  Filled 2019-10-10: qty 0.6

## 2019-10-10 MED ORDER — METHYLPREDNISOLONE SODIUM SUCC 125 MG IJ SOLR: 40.0000 mg | INTRAMUSCULAR | Status: DC | PRN

## 2019-10-10 MED ORDER — VANCOMYCIN HCL IN DEXTROSE 1-5 GM/200ML-% IV SOLN
INTRAVENOUS | Status: AC
  Administered 2019-10-10: 1000 mg via INTRAVENOUS
  Filled 2019-10-10: qty 200

## 2019-10-10 MED ORDER — DIPHENHYDRAMINE HCL 50 MG/ML IJ SOLN: 50.0000 mg | INTRAMUSCULAR | Status: DC | PRN

## 2019-10-10 NOTE — Progress Notes (Addendum)
[facility]  Subjective  - left knee pain is his cc.   Objective (!) 118/52 68 98.4 F (36.9 C) (Oral) 18 100%  Intake/Output Summary (Last 24 hours) at 10/10/2019 0753 Last data filed at 10/10/2019 0645 Gross per 24 hour  Intake 1150 ml  Output 0 ml  Net 1150 ml   Left thigh wound wet to dry dressing changes daily Base with minimal granulation no significant change from 10/09/2019.   Assessment/Planning: PO Inferior venacavogram  I&D of left thigh including resection of multiple old dialysis grafts and overlying skin.  Left thigh AV graft wound culture results:  FEW CORYNEBACTERIUM STRIATUM  Standardized susceptibility testing for this organism is not available.  NO ANAEROBES ISOLATED; CULTURE IN PROGRESS FOR 5 DAYS  No growth on culture from knee aspiration Afebrile this am    For now will cont. Wet to dry dressing changes.  Discussed trying wound vac again.  He will consider it prior to discharge.    Roxy Horseman 10/10/2019 7:53 AM --  Laboratory Lab Results: Recent Labs    10/08/19 0739 10/10/19 0352  WBC 7.8 5.5  HGB 7.9* 7.8*  HCT 27.4* 27.1*  PLT 129* 164   BMET Recent Labs    10/09/19 0156 10/10/19 0352  NA 135 135  K 4.6 5.0  CL 94* 98  CO2 25 25  GLUCOSE 107* 108*  BUN 45* 69*  CREATININE 6.72* 8.74*  CALCIUM 9.5 9.3    COAG Lab Results  Component Value Date   INR 1.0 12/01/2018   INR 1.2 11/26/2018   INR 1.14 12/13/2017   No results found for: PTT

## 2019-10-10 NOTE — Progress Notes (Signed)
Pharmacy Antibiotic Note  [name] is a 54 y.o. male admitted on 10/03/2019 with issues with functionality of HD cath.  Overnight 10/3>10/4 developed new fever and knee swelling--found to have septic arthritis now s/p knee arthroscopy and antibiotic bead placement. Pharmacy has been consulted for vancomycin dosing.  Per ID, plan is to continue vancomycin 1000 mg IV and ceftazidime 2 g IV every MWF with dialysis until 11/04/19. Pre-HD vancomycin level today is at goal of 15-25 mcg/mL. Pt is clinically improving. WBC wnl, afebrile.   Plan: Continue vancomycin 1000 mg IV every MWF with dialysis until 11/04/19 Continue ceftazidime 2 g IV every MWF with dialysis until 11/04/19 Weekly pre-HD vancomycin level   Height: 5\' 11"  (180.3 cm) Weight: 118.6 kg (261 lb 7.5 oz) IBW/kg (Calculated) : 75.3  Temp (24hrs), Avg:98.4 F (36.9 C), Min:98 F (36.7 C), Max:98.7 F (37.1 C)  Recent Labs  Lab 10/06/19 0152 10/06/19 0152 10/07/19 0430 10/08/19 0115 10/08/19 0739 10/08/19 0748 10/09/19 0156 10/10/19 0352  WBC 9.8  --  5.4 6.0 7.8  --   --  5.5  CREATININE 8.73*   < > 6.68* 8.80*  --  8.78* 6.72* 8.74*  VANCORANDOM  --   --   --   --   --   --   --  24   < > = values in this interval not displayed.    Estimated Creatinine Clearance: 12.8 mL/min (A) (by C-G formula based on SCr of 8.74 mg/dL (H)).    Allergies  Allergen Reactions  . Hydrocodone Other (See Comments)    Caused involuntary movement and twitching. CANNOT TAKE DUE TO MUSCLE SPASMS AND MUSCLE TREMORS  . Furadantin [Nitrofurantoin] Other (See Comments)    UNSPECIFIED REACTION   . Mandelamine [Methenamine] Other (See Comments)    UNSPECIFIED REACTION   . Noroxin [Norfloxacin] Other (See Comments)    UNSPECIFIED REACTION   . Sulfa Antibiotics Other (See Comments) and Cough    Childhood reaction - pt could not confirm that it was a cough  . Sulfur Cough    Childhood reaction - pt could not confirm that it was a cough     Antimicrobials this admission: Vancomycin 10/4 >>  Zosyn 10/4 >>10/5 Ceftriaxone 10/6 >> 10/7 Ceftazidime 10/7 >>  Thank you for allowing pharmacy to be a part of this patient's care.  Rebbeca Paul, PharmD PGY1 Pharmacy Resident 10/10/2019 1:13 PM  Please check [email or web address] for unit-specific pharmacy phone numbers.

## 2019-10-10 NOTE — Progress Notes (Addendum)
Family Medicine Teaching Service Daily Progress Note Intern Pager: [phone]  Patient name: [name] Medical record number: [id number] Date of birth: [birth year] Age: 54 y.o. Gender: male  Primary Care Provider: [name], MD Consultants: Nephrology, vascular surgery, orthopedics   Code Status: Full Code  Pt Overview and Major Events to Date:  10/1 Admitted, HD attempted (terminated early due to pain) 10/2Inferior venacavogram(right), I&D of left thigh with graft excision, HD 10/4 Joint aspiration left knee 10/5 Arthroscopic left knee irrigation and drainage with placement of antibiotic beads  Assessment and Plan: [name] is a 54 y.o. male presenting with inability to obtain access at his right femoral dialysis catheter while at HD, now with acute knee pain and swelling with fever concerning for septic arthritis. PMH is significant for ESRD on HD (MWF), PAD, kidney stones, spina bifida, hx of nephrostomy tube, HTN, GERD, anxiety.  Dialysis access issue Now resolved s/p inferior venacavogram.  Left thigh wound S/p I&D of left thigh with graft excision 10/2.  Wound VAC placed twice, was not working properly. Wound culture growing few corynebacterium striatum. ID consulted, appreciate recommendations.  Vascular surgery recommending to retry wound VAC, patient will reconsider. - wet to dry dressing changes BID - antibiotics as below  Acute left knee pain Concerning for septic arthritis s/p joint aspiration and arthroscopic left knee irrigation and drainage with placement of antibiotic beads. Synovial fluid with 95A WBC (neutrophilic predominance), though no organisms seen on Gram stain.  No crystals seen on synovial fluid.  Cultures pending, no growth so far. Orthopedics following, appreciate involvement.  Well controlled this morning. - oxycodone 10 mg q6 prn - scheduled Tylenol - IV hydromorphone 1 mg q4 prn - f/u cultures  Fever Unclear etiology but concern  for septic arthritis possible given acute onset of left knee pain and swelling. As above, wound culture growing few corynebacterium striatum. ID following, appreciate involvement.  Per ID, antibiotic plan: Vancomycin and ceftazidime MWF with dialysis for 4 weeks total course.  Will clarify. Last fever 10/4.  - s/p piperacillin-tazobactam (10/4-10/6) - s/p CTX (10/6-10/7) - vancomycin MWF with HD (10/4-) - ceftazidime MWF with HD (10/8-) - Tylenol prn - f/u blood cultures - f/u synovial fluid cultures - f/u left thigh wound cultures   ESRD MWF dialysis, continue while inpatient per nephrology.  Dysphagia Chronic, worse over past week. - SLP - ENT outpatient  Aortic stenosis Severe AS noted on echo this admission.  Likely contributing to his symptoms of chest pain and shortness of breath. - outpatient follow-up (sees Dr. Angelena Form)  Insomnia Takes zolpidem prn for sleep, trazodone prn for nightmares. - continue home meds (trazodone reordered)  FEN/GI: renal diet PPx: SCDs  Disposition: med-surg, PT/OT recommending SNF (will reassess)  Subjective:  This morning, patient states pain in left knee is improved.  He is hesitant to go to SNF and would like PT to reassess him today, feels he can do better after dialysis.  Objective: Temp:  [98.3 F (36.8 C)-98.7 F (37.1 C)] 98.4 F (36.9 C) (10/08 0457) Pulse Rate:  [68-77] 68 (10/08 0457) Resp:  [18] 18 (10/07 2146) BP: (105-124)/(51-65) 118/52 (10/08 0457) SpO2:  [99 %-100 %] 100 % (10/08 0457) Physical Exam: General:Obese male, NAD Cardiovascular:RRR, 3/6 systolic murmur best heard at LUSB Respiratory:CTAB Abdomen:soft, mild left-sided tenderness, +BS Extremities:WWP, left lower extremity with bandage in place, able to lift left leg (limited by pain, but improved from yesterday)    Laboratory: Recent Labs  Lab 10/08/19 0115 10/08/19 0739 10/10/19  0352  WBC 6.0 7.8 5.5  HGB 7.9* 7.9* 7.8*  HCT 27.3* 27.4*  27.1*  PLT 123* 129* 164   Recent Labs  Lab 10/08/19 0748 10/09/19 0156 10/10/19 0352  NA 132* 135 135  K 6.2* 4.6 5.0  CL 95* 94* 98  CO2 22 25 25   BUN 64* 45* 69*  CREATININE 8.78* 6.72* 8.74*  CALCIUM 9.0 9.5 9.3  GLUCOSE 103* 107* 108*    Imaging/Diagnostic Tests: No new imaging.  Zola Button, MD 10/10/2019, 9:34 AM PGY-1, [place] Intern pager: [phone], text pages welcome

## 2019-10-10 NOTE — Progress Notes (Signed)
[facility]ious [facility]  Date of Admission:  10/03/2019      Lines:  Right groin HD  Abx: 10/7-c ceftazidime 10/2-c vanc  10/6 ceftriaxone 10/02-10/6 piptazo 2.25 q8hour                                                    Assessment: Exzavier Ruderman is a 54 y.o. male spina bifida, myelomeningocele, neurogenic bladder s/p ileal conduit, s/p ileostomy, httn/hlp/PAD, esrd, very difficult dialysis access situation, chart hx iliac artery stent, GERD, bicuspid valve with severe AS, OA, hx native MV endocarditis (MSSA), hx left renal abscess (fusobacterium), admitted for rle vascular graft site infection, course complicated by swollen right knee presumed septic arthritis   Patient appears to have had failed vascular graft left thigh that had been infected for at least several weeks. He was given some abx in the dialysis clinic a few days prior to admission. Culture of the graft material/surrouding tissue only grow corynebacterium species. He subsequently had acute monoarthritis of the contiguous left knee (operative cx on abx negative; no crystal).   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 Also various gram positive/negative on gram stain of left thigh wound in the past  In that setting, I would treat with 1 week of vancomycin to cover for the corynebacterium species (now that the graft is out), and have him on levofloxacin for probable gram negative coverage as he received prior abx and cx could have been negative due to that. After 1 week of vancomycin, would continue tx with doxycycline and levofloxacin to finish off total of 4 week abx treatment. This focuses on the deep skin soft tissue infection associated with the graft and probable left knee septic arthritis  He had nonspecific chest pain, a tte without obvious vegeation and negative blood culture, so not sure what to make of the chest pain but perhaps  multifactorial including severe AS and some musculoskeletal component. Perhaps also an esophageal component. Should be f/u'ed by cardiology  On a final note, I am unsure if his left renal-percutaneous sinus tract will become reinfected. If it does he might need the tract removal. He already is at high risk of HD access failure, and would like to avoid source of infection that could affect the HD access  He had prior iliac stent per chart but off abx after his MV endocarditis seem to do fine, so will not place on suppressive abx course  --------- Clinically doing well on abx   Plan: 1. For ease of abx administration, final abx plan is vancomycin 1 gram and ceftazidime 2 gram post dialysis tiw until 11/02. OPAT patient will need weekly cbc, cmp, crp and predialysis vancomycin level 2. Patient's memory of norfloxacin allergy is vague; will do mini oral challenge with levofloxacin here 3. He has appointment id f/u with me on 11/05/2019 @ 145pm at [facility] 4. Id will sign off, please call if further question  Active Problems:   ESRD (end stage renal disease) ([place])   Complication associated with dialysis catheter   Hyperkalemia   Dysphagia   Pain and swelling of knee, left   Pyogenic arthritis of left knee joint (HCC)   Wound infection   Sepsis without acute organ dysfunction ([place])   Vascular graft infection ([place])  Scheduled Meds: . acetaminophen  1,000 mg Oral Q8H  . aspirin EC  325 mg Oral Daily  . calcium acetate  2,001 mg Oral With snacks  . calcium acetate  2,668 mg Oral TID with meals  . Chlorhexidine Gluconate Cloth  6 each Topical Q0600  . clopidogrel  75 mg Oral Daily  . [START ON 10/13/2019] darbepoetin (ARANESP) injection - DIALYSIS  200 mcg Intravenous Q Mon-HD  . docusate sodium  100 mg Oral BID  . doxercalciferol  3 mcg Intravenous Q M,W,F-HD  . famotidine  10 mg Oral Q M,W,F-2000  . heparin  5,000 Units Subcutaneous Q8H  . levofloxacin  250 mg Oral Once  .  levothyroxine  137 mcg Oral QAC breakfast  . lidocaine  2 patch Transdermal Daily  . pantoprazole  40 mg Oral QAC breakfast  . sodium polystyrene  15 g Oral Once per day on Sun Sat   Continuous Infusions: . cefTAZidime (FORTAZ)  IV    . vancomycin Stopped (10/08/19 1122)   PRN Meds:.diphenhydrAMINE, EPINEPHrine, HYDROmorphone (DILAUDID) injection, methylPREDNISolone (SOLU-MEDROL) injection, Muscle Rub, [DISCONTINUED] oxyCODONE-acetaminophen **AND** oxyCODONE, promethazine, traZODone, zolpidem   SUBJECTIVE: Doing well no abx side effect No n/v/diarrhea/fever, chill hds No issue with right LE dialysis line  Review of Systems: ROS Negative 11 point ros unless mentioned above  Allergies  Allergen Reactions  . Hydrocodone Other (See Comments)    Caused involuntary movement and twitching. CANNOT TAKE DUE TO MUSCLE SPASMS AND MUSCLE TREMORS  . Furadantin [Nitrofurantoin] Other (See Comments)    UNSPECIFIED REACTION   . Mandelamine [Methenamine] Other (See Comments)    UNSPECIFIED REACTION   . Noroxin [Norfloxacin] Other (See Comments)    UNSPECIFIED REACTION   . Sulfa Antibiotics Other (See Comments) and Cough    Childhood reaction - pt could not confirm that it was a cough  . Sulfur Cough    Childhood reaction - pt could not confirm that it was a cough    OBJECTIVE: Vitals:   10/10/19 1030 10/10/19 1100 10/10/19 1130 10/10/19 1200  BP: (!) 94/42 (!) 86/44 (!) 101/55 (!) 113/54  Pulse: 72 72 73 76  Resp:      Temp:      TempSrc:      SpO2:      Weight:      Height:       Body mass index is 36.47 kg/m.  Physical Exam  Alert/oriented; no distress; getting dialysis and eating breakfast Normocephalic per conj clear cv rrr no mrg Lungs clear abd s/nt; ileal conduit bag with clear urine Ext atrophic Skin left thigh/knee in dressign c/d/i; left flank sinus tract no purulence msk good passive rom left knee Neuro nonfocal  Lab Results Lab Results  Component Value  Date   WBC 5.5 10/10/2019   HGB 7.8 (L) 10/10/2019   HCT 27.1 (L) 10/10/2019   MCV 98.5 10/10/2019   PLT 164 10/10/2019    Lab Results  Component Value Date   CREATININE 8.74 (H) 10/10/2019   BUN 69 (H) 10/10/2019   NA 135 10/10/2019   K 5.0 10/10/2019   CL 98 10/10/2019   CO2 25 10/10/2019    Lab Results  Component Value Date   ALT 6 10/10/2019   AST 7 (L) 10/10/2019   ALKPHOS 56 10/10/2019   BILITOT 0.3 10/10/2019     Microbiology: Recent Results (from the past 240 hour(s))  Resp Panel by RT PCR (RSV, Flu A&B, Covid) - Nasopharyngeal Swab  Status: None   Collection Time: 10/03/19  1:18 PM   Specimen: Nasopharyngeal Swab  Result Value Ref Range Status   SARS Coronavirus 2 by RT PCR NEGATIVE NEGATIVE Final    Comment: (NOTE) SARS-CoV-2 target nucleic acids are NOT DETECTED.  The SARS-CoV-2 RNA is generally detectable in upper respiratoy specimens during the acute phase of infection. The lowest concentration of SARS-CoV-2 viral copies this assay can detect is 131 copies/mL. A negative result does not preclude SARS-Cov-2 infection and should not be used as the sole basis for treatment or other patient management decisions. A negative result may occur with  improper specimen collection/handling, submission of specimen other than nasopharyngeal swab, presence of viral mutation(s) within the areas targeted by this assay, and inadequate number of viral copies (<131 copies/mL). A negative result must be combined with clinical observations, patient history, and epidemiological information. The expected result is Negative.  Fact Sheet for Patients:  PinkCheek.be  Fact Sheet for Healthcare Providers:  GravelBags.it  This test is no t yet approved or cleared by the [place] FDA and  has been authorized for detection and/or diagnosis of SARS-CoV-2 by FDA under an Emergency Use Authorization (EUA). This EUA  will remain  in effect (meaning this test can be used) for the duration of the COVID-19 declaration under Section 564(b)(1) of the Act, 21 U.S.C. section 360bbb-3(b)(1), unless the authorization is terminated or revoked sooner.     Influenza A by PCR NEGATIVE NEGATIVE Final   Influenza B by PCR NEGATIVE NEGATIVE Final    Comment: (NOTE) The Xpert Xpress SARS-CoV-2/FLU/RSV assay is intended as an aid in  the diagnosis of influenza from Nasopharyngeal swab specimens and  should not be used as a sole basis for treatment. Nasal washings and  aspirates are unacceptable for Xpert Xpress SARS-CoV-2/FLU/RSV  testing.  Fact Sheet for Patients: PinkCheek.be  Fact Sheet for Healthcare Providers: GravelBags.it  This test is not yet approved or cleared by the [place] FDA and  has been authorized for detection and/or diagnosis of SARS-CoV-2 by  FDA under an Emergency Use Authorization (EUA). This EUA will remain  in effect (meaning this test can be used) for the duration of the  Covid-19 declaration under Section 564(b)(1) of the Act, 21  U.S.C. section 360bbb-3(b)(1), unless the authorization is  terminated or revoked.    Respiratory Syncytial Virus by PCR NEGATIVE NEGATIVE Final    Comment: (NOTE) Fact Sheet for Patients: PinkCheek.be  Fact Sheet for Healthcare Providers: GravelBags.it  This test is not yet approved or cleared by the [place] FDA and  has been authorized for detection and/or diagnosis of SARS-CoV-2 by  FDA under an Emergency Use Authorization (EUA). This EUA will remain  in effect (meaning this test can be used) for the duration of the  COVID-19 declaration under Section 564(b)(1) of the Act, 21 U.S.C.  section 360bbb-3(b)(1), unless the authorization is terminated or  revoked. Performed at [facility], [place] [address] [place],  [place] [address]   Surgical pcr screen     Status: None   Collection Time: 10/03/19 10:24 PM   Specimen: Nasal Mucosa; Nasal Swab  Result Value Ref Range Status   MRSA, PCR NEGATIVE NEGATIVE Final   Staphylococcus aureus NEGATIVE NEGATIVE Final    Comment: (NOTE) The Xpert SA Assay (FDA approved for NASAL specimens in patients 45 years of age and older), is one component of a comprehensive surveillance program. It is not intended to diagnose infection nor to guide  or monitor treatment. Performed at [facility], [place] [address], [place], [place] [address]   Aerobic/Anaerobic Culture (surgical/deep wound)     Status: None   Collection Time: 10/04/19  8:39 AM   Specimen: Wound; Body Fluid  Result Value Ref Range Status   Specimen Description WOUND  Final   Special Requests LEFT THIGH SPEC  Final   Gram Stain NO WBC SEEN RARE GRAM POSITIVE RODS   Final   Culture   Final    FEW CORYNEBACTERIUM STRIATUM Standardized susceptibility testing for this organism is not available. NO ANAEROBES ISOLATED Performed at [facility], [place] [address] [place], [place] [address]    Report Status 10/09/2019 FINAL  Final  Culture, blood (routine x 2)     Status: None (Preliminary result)   Collection Time: 10/06/19  1:47 AM   Specimen: BLOOD LEFT HAND  Result Value Ref Range Status   Specimen Description BLOOD LEFT HAND  Final   Special Requests   Final    BOTTLES DRAWN AEROBIC AND ANAEROBIC Blood Culture results may not be optimal due to an inadequate volume of blood received in culture bottles   Culture   Final    NO GROWTH 4 DAYS Performed at [facility], [place] [address], [place], [place] [address]    Report Status PENDING  Incomplete  Culture, blood (routine x 2)     Status: None (Preliminary result)   Collection Time: 10/06/19  1:52 AM   Specimen: BLOOD  Result Value Ref Range Status   Specimen Description BLOOD RIGHT ANTECUBITAL  Final   Special Requests   Final    BOTTLES  DRAWN AEROBIC ONLY Blood Culture adequate volume   Culture   Final    NO GROWTH 4 DAYS Performed at [facility], [place] [address] [place], [place] [address]    Report Status PENDING  Incomplete  Body fluid culture     Status: None   Collection Time: 10/06/19  3:06 AM   Specimen: Synovial Fluid  Result Value Ref Range Status   Specimen Description FLUID SYNOVIAL LEFT KNEE  Final   Special Requests NONE  Final   Gram Stain   Final    MANY WBC PRESENT, PREDOMINANTLY PMN NO ORGANISMS SEEN    Culture   Final    NO GROWTH 3 DAYS Performed at [facility], [place] [address] [place], [place] [address]    Report Status 10/09/2019 FINAL  Final  Anaerobic culture     Status: None (Preliminary result)   Collection Time: 10/06/19  3:06 AM   Specimen: Synovial Fluid  Result Value Ref Range Status   Specimen Description FLUID SYNOVIAL LEFT KNEE  Final   Special Requests   Final    NONE Performed at [facility], [place] [address], [place], [place] [address]    Culture   Final    NO ANAEROBES ISOLATED; CULTURE IN PROGRESS FOR 5 DAYS   Report Status PENDING  Incomplete  Surgical pcr screen     Status: None   Collection Time: 10/07/19  1:29 AM   Specimen: Nasal Mucosa; Nasal Swab  Result Value Ref Range Status   MRSA, PCR NEGATIVE NEGATIVE Final   Staphylococcus aureus NEGATIVE NEGATIVE Final    Comment: (NOTE) The Xpert SA Assay (FDA approved for NASAL specimens in patients 67 years of age and older), is one component of a comprehensive surveillance program. It is not intended to diagnose infection  nor to guide or monitor treatment. Performed at [facility], [place] [address] [place], [place] [address]   Aerobic/Anaerobic Culture (surgical/deep wound)     Status: None (Preliminary result)   Collection Time: 10/07/19  3:17 PM   Specimen: Joint, Other; Body Fluid  Result Value Ref Range Status   Specimen Description FLUID  Final   Special Requests LEFT KNEE JOINT  SPEC A  Final   Gram Stain   Final    MODERATE WBC PRESENT,BOTH PMN AND MONONUCLEAR NO ORGANISMS SEEN    Culture   Final    NO GROWTH 3 DAYS NO ANAEROBES ISOLATED; CULTURE IN PROGRESS FOR 5 DAYS Performed at [facility], [place] [address], [place], [place] [address]    Report Status PENDING  Incomplete    Serology: n/a   Jabier Mutton, [facility] [phone] pager    10/10/2019, 12:29 PM

## 2019-10-10 NOTE — Progress Notes (Signed)
FPTS Interim Progress Note  S: Notified by nurse that patient is experiencing chest heaviness, patient just returned from HD. Patient seen and examined at bedside immediately after made aware of this. Floor nurse was made aware that patient received an extra bolus in his arterial vein which was later corrected. Patient endorses chest heaviness soon after this when seen after returning back to the floor. Patient denies dyspnea, chest pain, radiating pain down the arms or jaw. Denies vision changes, weakness or dizziness. By the time I left the room, patient's heaviness sensation of his chest was resolving.   O: BP 121/72 (BP Location: Right Arm)   Pulse 77   Temp 99.3 F (37.4 C) (Oral)   Resp 18   Ht 5\' 11"  (1.803 m)   Wt 118.6 kg   SpO2 100%   BMI 36.47 kg/m   General: Patient laying comfortably in bed, in no acute distress. CV: 2/6 systolic murmur along the left upper sternal border, RRR Resp: lungs clear to auscultation bilaterally, breathing comfortably on room air without signs of respiratory distress Ext: radial and distal pulses intact bilaterally, no LE edema noted   A/P: -continue with current treatment plan   Donney Dice, DO 10/10/2019, 4:09 PM PGY-1, [facility] pager [phone]

## 2019-10-10 NOTE — Care Management Important Message (Signed)
-  Important Message  Patient Details  Name: [name] MRN: [id number] Date of Birth: 04-08-1965   Medicare Important Message Given:  Yes  Tried to call the patient in his room to make him aware of the IM did not get an answer will mail Document to the patients home address    Orbie Pyo 10/10/2019, 3:25 PM

## 2019-10-10 NOTE — Progress Notes (Signed)
[facility] Progress Note   Subjective:   Patient seen and examined at bedside in dialysis.  Tolerating dialysis well using R thigh TDC, which is working well today.  Complains of nausea, just given phenergan.  Admits to drinking too much yesterday, advised to follow fluid restrictions.  Denies SOB, CP, fever, chills, vomiting and diarrhea.   Objective Vitals:   10/10/19 0457 10/10/19 0935 10/10/19 0943 10/10/19 1000  BP: (!) 118/52 131/78 112/69 (!) 114/59  Pulse: 68 91 77 73  Resp:  18    Temp: 98.4 F (36.9 C) 98 F (36.7 C)    TempSrc: Oral Oral    SpO2: 100% 98%    Weight:  118.6 kg    Height:       Physical Exam General:obese male laying in bed in NAD Heart:RRR, +1/4 systolic murmur Lungs:CTAB anteriolaterally Abdomen:soft, NT Extremities:trace LE edema, dressing on L thigh Dialysis Access: R thigh TDC accessed  Filed Weights   10/08/19 0720 10/08/19 1154 10/10/19 0935  Weight: 117.2 kg 115.5 kg 118.6 kg    Intake/Output Summary (Last 24 hours) at 10/10/2019 1154 Last data filed at 10/10/2019 0645 Gross per 24 hour  Intake 790 ml  Output 0 ml  Net 790 ml    Additional Objective Labs: Basic Metabolic Panel: Recent Labs  Lab 10/08/19 0748 10/09/19 0156 10/10/19 0352  NA 132* 135 135  K 6.2* 4.6 5.0  CL 95* 94* 98  CO2 22 25 25   GLUCOSE 103* 107* 108*  BUN 64* 45* 69*  CREATININE 8.78* 6.72* 8.74*  CALCIUM 9.0 9.5 9.3  PHOS 8.6* 5.2* 5.6*   Liver Function Tests: Recent Labs  Lab 10/09/19 0156 10/10/19 0352 10/10/19 0754  AST  --   --  7*  ALT  --   --  6  ALKPHOS  --   --  56  BILITOT  --   --  0.3  PROT  --   --  7.3  ALBUMIN 3.1* 2.7* 2.7*   CBC: Recent Labs  Lab 10/06/19 0152 10/06/19 0152 10/07/19 0430 10/07/19 0430 10/08/19 0115 10/08/19 0739 10/10/19 0352  WBC 9.8   < > 5.4   < > 6.0 7.8 5.5  NEUTROABS 8.5*  --   --   --   --   --   --   HGB 8.9*   < > 8.2*   < > 7.9* 7.9* 7.8*  HCT 30.8*   < > 28.4*   < >  27.3* 27.4* 27.1*  MCV 98.1  --  98.6  --  99.6 98.6 98.5  PLT 159   < > 132*   < > 123* 129* 164   < > = values in this interval not displayed.   Blood Culture    Component Value Date/Time   SDES FLUID 10/07/2019 1517   SPECREQUEST LEFT KNEE JOINT SPEC A 10/07/2019 1517   CULT  10/07/2019 1517    NO GROWTH 3 DAYS NO ANAEROBES ISOLATED; CULTURE IN PROGRESS FOR 5 DAYS Performed at [facility], [place] [address], [place], [place] [address]    REPTSTATUS PENDING 10/07/2019 1517   Studies/Results: No results found.  Medications: . cefTAZidime (FORTAZ)  IV    . vancomycin Stopped (10/08/19 1122)   . acetaminophen  1,000 mg Oral Q8H  . aspirin EC  325 mg Oral Daily  . calcium acetate  2,001 mg Oral With snacks  . calcium acetate  2,668 mg Oral TID with meals  .  Chlorhexidine Gluconate Cloth  6 each Topical Q0600  . clopidogrel  75 mg Oral Daily  . docusate sodium  100 mg Oral BID  . doxercalciferol  3 mcg Intravenous Q M,W,F-HD  . famotidine  10 mg Oral Q M,W,F-2000  . heparin  5,000 Units Subcutaneous Q8H  . levothyroxine  137 mcg Oral QAC breakfast  . lidocaine  2 patch Transdermal Daily  . pantoprazole  40 mg Oral QAC breakfast  . sodium polystyrene  15 g Oral Once per day on Sun Sat    [facility] Optiflux 200NRe, Time: 4hr 78min, BFR 350, DFR 800, EDW 114.5kg, 2K/2.,25Ca, TDC heparin 3700 unit bolus Mircera 247mcg IV q 2 weeks- last dose 9/27 Venofer 100mg  q HD -last dose due today Phoslo 4 caps TO TID with meals and 3 with snacks Hectorol 3 mcg IV q HD Kayexalate 15g PO on non-dialysis days  Assessment/Plan: 1. ESRD:HD MWF.  S/p [facility] manipulation by Dr. Trula Slade 10/2 as well as L thigh debridement with old AVG removal. TDC running better since intervention, giving cath flo post each HD. Access options limited.  HD today per regular schedule. 2. Hyperkalemia:Recurrent issue -pt takes kayexalate on non-dialysis today.  K 5.0 today. 3. Fevers. Blood cultures neg to date.  Possible sources of infection include: dialysis catheter, left thigh wound, septic knee  -- Cultures neg to date. IV antibiotics per primary team. Afebrile x 24hrs.  4. L thigh wound/infected old AVG: S/p excision/debridement 10/2 - wet to dry dressings. Wound vac d/c due to dysfxn, may try again. Per Vascular. 5. L knee pain/effusion - s/p joint aspiration 10/4. Concern for septic arthrits 25K WBCs in synovial fluid. To OR for I &D 10/5 --Cultures neg to date.  6. Hypertension/volume:BPlow/stable. Not getting to EDW, continue to UF as tolerated.  Reinforced fluid restrictions.  7. Anemia:Hgb 8.9>7.9>7.8. ESA dosed 9/27, due Monday. Will order.  8. Metabolic bone disease:Ca/Phos ok. Continue hectorol and phoslo. 9. Nutrition:Renal diet with 1.2L fluid restriction recommended 10. L flank wound:D/t prior PCN tube - some erythema, no drainage - pt reports this is improved, follow closely. 11. Dispo: Pending  Jen Mow, PA-C [facility] 10/10/2019,11:54 AM  LOS: 6 days

## 2019-10-11 ENCOUNTER — Inpatient Hospital Stay (HOSPITAL_COMMUNITY): Payer: Medicare Other

## 2019-10-11 DIAGNOSIS — M009 Pyogenic arthritis, unspecified: Secondary | ICD-10-CM | POA: Diagnosis not present

## 2019-10-11 DIAGNOSIS — T829XXA Unspecified complication of cardiac and vascular prosthetic device, implant and graft, initial encounter: Secondary | ICD-10-CM | POA: Diagnosis not present

## 2019-10-11 DIAGNOSIS — N186 End stage renal disease: Secondary | ICD-10-CM | POA: Diagnosis not present

## 2019-10-11 DIAGNOSIS — T827XXD Infection and inflammatory reaction due to other cardiac and vascular devices, implants and grafts, subsequent encounter: Secondary | ICD-10-CM | POA: Diagnosis not present

## 2019-10-11 LAB — COMPREHENSIVE METABOLIC PANEL
ALT: 7 U/L (ref 0–44)
AST: 9 U/L — ABNORMAL LOW (ref 15–41)
Albumin: 2.9 g/dL — ABNORMAL LOW (ref 3.5–5.0)
Alkaline Phosphatase: 66 U/L (ref 38–126)
Anion gap: 15 (ref 5–15)
BUN: 49 mg/dL — ABNORMAL HIGH (ref 6–20)
CO2: 25 mmol/L (ref 22–32)
Calcium: 9.4 mg/dL (ref 8.9–10.3)
Chloride: 97 mmol/L — ABNORMAL LOW (ref 98–111)
Creatinine, Ser: 7.22 mg/dL — ABNORMAL HIGH (ref 0.61–1.24)
GFR, Estimated: 8 mL/min — ABNORMAL LOW (ref >60)
Glucose, Bld: 91 mg/dL (ref 70–99)
Potassium: 4.6 mmol/L (ref 3.5–5.1)
Sodium: 137 mmol/L (ref 135–145)
Total Bilirubin: 0.3 mg/dL (ref 0.3–1.2)
Total Protein: 7.3 g/dL (ref 6.5–8.1)

## 2019-10-11 LAB — CBC WITH DIFFERENTIAL/PLATELET
Abs Immature Granulocytes: 0.02 10*3/uL (ref 0.00–0.07)
Basophils Absolute: 0 10*3/uL (ref 0.0–0.1)
Basophils Relative: 1 %
Eosinophils Absolute: 0.3 10*3/uL (ref 0.0–0.5)
Eosinophils Relative: 5 %
HCT: 31 % — ABNORMAL LOW (ref 39.0–52.0)
Hemoglobin: 9.2 g/dL — ABNORMAL LOW (ref 13.0–17.0)
Immature Granulocytes: 0 %
Lymphocytes Relative: 16 %
Lymphs Abs: 0.8 10*3/uL (ref 0.7–4.0)
MCH: 29.2 pg (ref 26.0–34.0)
MCHC: 29.7 g/dL — ABNORMAL LOW (ref 30.0–36.0)
MCV: 98.4 fL (ref 80.0–100.0)
Monocytes Absolute: 0.4 10*3/uL (ref 0.1–1.0)
Monocytes Relative: 8 %
Neutro Abs: 3.5 10*3/uL (ref 1.7–7.7)
Neutrophils Relative %: 70 %
Platelets: 181 10*3/uL (ref 150–400)
RBC: 3.15 MIL/uL — ABNORMAL LOW (ref 4.22–5.81)
RDW: 17.5 % — ABNORMAL HIGH (ref 11.5–15.5)
WBC: 5 10*3/uL (ref 4.0–10.5)
nRBC: 0 % (ref 0.0–0.2)

## 2019-10-11 LAB — ANAEROBIC CULTURE

## 2019-10-11 LAB — CULTURE, BLOOD (ROUTINE X 2)
Culture: NO GROWTH
Culture: NO GROWTH
Special Requests: ADEQUATE

## 2019-10-11 MED ORDER — PROMETHAZINE HCL 25 MG/ML IJ SOLN
25.0000 mg | Freq: Four times a day (QID) | INTRAMUSCULAR | Status: DC | PRN
  Administered 2019-10-11 – 2019-10-13 (×5): 25 mg via INTRAVENOUS
  Filled 2019-10-11 (×5): qty 1

## 2019-10-11 MED ORDER — LOPERAMIDE HCL 2 MG PO CAPS
2.0000 mg | ORAL_CAPSULE | Freq: Once | ORAL | Status: AC
  Administered 2019-10-11: 2 mg via ORAL
  Filled 2019-10-11: qty 1

## 2019-10-11 NOTE — Progress Notes (Signed)
[facility] Progress Note   Subjective:   Patient seen and examined at bedside.  Reports difficultly sleeping due to cramping abdominal pain and diarrhea.  Reports this is common when he takes colace daily, typically only takes every few days.  Requesting a dose of imodium.  Continues to have nausea.  Knee pain improving.  Has not decided if he wants to have the wound vac reapplied prior to d/c.  Objective Vitals:   10/10/19 1459 10/10/19 2015 10/11/19 0549 10/11/19 1036  BP: 121/72 96/64 (!) 91/49 106/60  Pulse: 77 82 69   Resp: 18 20  20   Temp: 99.3 F (37.4 C) 99.6 F (37.6 C)  99.5 F (37.5 C)  TempSrc: Oral Oral  Oral  SpO2: 100% 100% 98% 99%  Weight:  117.1 kg    Height:       Physical Exam General:WDWN obese male in NAD Heart:RRR, +0/6 systolic murmur Lungs:CTAB anteriolaterally Abdomen:soft, NT Extremities:trace LE edema, dressing on L thigh and knee Dialysis Access: R thigh TDC dressed   Filed Weights   10/10/19 0935 10/10/19 1420 10/10/19 2015  Weight: 118.6 kg 115.9 kg 117.1 kg    Intake/Output Summary (Last 24 hours) at 10/11/2019 1101 Last data filed at 10/11/2019 0919 Gross per 24 hour  Intake 501.17 ml  Output 2707 ml  Net -2205.83 ml    Additional Objective Labs: Basic Metabolic Panel: Recent Labs  Lab 10/08/19 0748 10/09/19 0156 10/10/19 0352  NA 132* 135 135  K 6.2* 4.6 5.0  CL 95* 94* 98  CO2 22 25 25   GLUCOSE 103* 107* 108*  BUN 64* 45* 69*  CREATININE 8.78* 6.72* 8.74*  CALCIUM 9.0 9.5 9.3  PHOS 8.6* 5.2* 5.6*   Liver Function Tests: Recent Labs  Lab 10/09/19 0156 10/10/19 0352 10/10/19 0754  AST  --   --  7*  ALT  --   --  6  ALKPHOS  --   --  56  BILITOT  --   --  0.3  PROT  --   --  7.3  ALBUMIN 3.1* 2.7* 2.7*   CBC: Recent Labs  Lab 10/06/19 0152 10/06/19 0152 10/07/19 0430 10/07/19 0430 10/08/19 0115 10/08/19 0739 10/10/19 0352  WBC 9.8   < > 5.4   < > 6.0 7.8 5.5  NEUTROABS 8.5*  --   --   --    --   --   --   HGB 8.9*   < > 8.2*   < > 7.9* 7.9* 7.8*  HCT 30.8*   < > 28.4*   < > 27.3* 27.4* 27.1*  MCV 98.1  --  98.6  --  99.6 98.6 98.5  PLT 159   < > 132*   < > 123* 129* 164   < > = values in this interval not displayed.   Blood Culture    Component Value Date/Time   SDES FLUID 10/07/2019 1517   SPECREQUEST LEFT KNEE JOINT SPEC A 10/07/2019 1517   CULT  10/07/2019 1517    NO GROWTH 3 DAYS NO ANAEROBES ISOLATED; CULTURE IN PROGRESS FOR 5 DAYS Performed at [facility], [place] [address] [place], [place] [address]    REPTSTATUS PENDING 10/07/2019 1517    Medications: . cefTAZidime (FORTAZ)  IV 2 g (10/10/19 1511)  . vancomycin 1,000 mg (10/10/19 1313)   . acetaminophen  1,000 mg Oral Q8H  . aspirin EC  325 mg Oral Daily  . calcium acetate  2,001 mg  Oral With snacks  . calcium acetate  2,668 mg Oral TID with meals  . Chlorhexidine Gluconate Cloth  6 each Topical Q0600  . clopidogrel  75 mg Oral Daily  . [START ON 10/13/2019] darbepoetin (ARANESP) injection - DIALYSIS  200 mcg Intravenous Q Mon-HD  . doxercalciferol  3 mcg Intravenous Q M,W,F-HD  . famotidine  10 mg Oral Q M,W,F-2000  . heparin  5,000 Units Subcutaneous Q8H  . levothyroxine  137 mcg Oral QAC breakfast  . lidocaine  2 patch Transdermal Daily  . pantoprazole  40 mg Oral QAC breakfast  . sodium polystyrene  15 g Oral Once per day on Sun Sat    [facility] Optiflux 200NRe, Time: 4hr 77min, BFR 350, DFR 800, EDW 114.5kg, 2K/2.,25Ca, TDC heparin 3700 unit bolus Mircera 21mcg IV q 2 weeks- last dose 9/27 Venofer 100mg  q HD -last dose due today Phoslo 4 caps TO TID with meals and 3 with snacks Hectorol 3 mcg IV q HD Kayexalate 15g PO on non-dialysis days  Assessment/Plan: 1. ESRD:HD MWF. S/p [facility] manipulation by Dr. Trula Slade 10/2 as well as L thigh debridement with old AVG removal. TDC running better since intervention, giving cath flo post each HD.  Access options limited. Next HD 10/11. 2. Hyperkalemia:Recurrent issue -pt takes kayexalate on non-dialysis today. last K 5.0 pre HD. 3. Fevers. Blood cultures neg to date. Possible sources of infection include: dialysis catheter, left thigh wound, septic knee-- wound cultures neg to date. IV antibiotics per primary team. tmax 99.6 x24hrs 4. L thigh wound/infected old AVG: S/p excision/debridement 10/2 - wet to dry dressings. Wound vac d/c due to dysfxn, may try again. Per Vascular. 5. L knee pain/effusion - s/p joint aspiration 10/4. Concern for septic arthrits 25K WBCs insynovialfluid. To OR for I &D 10/5 --Culturesneg to date. 6. Hypertension/volume:BPlow/stable. Not getting to EDW, continue to UF as tolerated.  Reinforced fluid restrictions.  7. Anemia:Hgb 8.9>7.9>7.8. ESA dosed 9/27, due Monday. Will order.  8. Metabolic bone disease:Ca/Phos ok. Continue hectorol and phoslo. 9. Nutrition:Renal diet with 1.2L fluid restriction recommended 10. L flank wound:D/t prior PCN tube - some erythema, no drainage - pt reports this is improved, follow closely. 11. Dispo: Pending 12. Diarrhea - per pt common when taking colace.  D/t colace and ordered 1 dose imodium   Jen Mow, PA-C [facility] 10/11/2019,11:01 AM  LOS: 7 days

## 2019-10-11 NOTE — Plan of Care (Signed)
  Problem: Education: Goal: Knowledge of General Education information will improve Description: Including pain rating scale, medication(s)/side effects and non-pharmacologic comfort measures Outcome: Progressing   Problem: Pain Managment: Goal: General experience of comfort will improve Outcome: Progressing   

## 2019-10-11 NOTE — Progress Notes (Signed)
Family Medicine Teaching Service Daily Progress Note Intern Pager: [phone]  Patient name: Yaron Grasse Medical record number: [id number] Date of birth: [birth year] Age: 54 y.o. Gender: male  Primary Care Provider: [name], MD Consultants: Nephrology, vascular surgery, orthopedics Code Status: FULL  Pt Overview and Major Events to Date:  10/1 Admitted, HD attempted (terminated early due to pain) 10/2Inferior venacavogram(right), I&D of left thigh with graft excision, HD 10/4 Joint aspiration left knee 10/5 Arthroscopic left knee irrigation and drainage with placement of antibiotic beads  Assessment and Plan: Reilley Latorre is a 54 y.o. male presenting with inability to obtain access at his right femoral dialysis catheter while at HD, now with acute knee pain and swelling with fever concerning for septic arthritis. PMH is significant for ESRD on HD (MWF), PAD, kidney stones, spina bifida, hx of nephrostomy tube, HTN, GERD, anxiety.  Nausea Patient complaining of nausea, diarrhea this morning. Had diffuse abdominal tenderness on exam - IV Phenergen 25mg  - Immodium 2mg  per Nephro  - f/u EKG  - remove rectal tube 10/10 - d/c colace  - will consider consulting nephro about switching patient from Kayexalate to [facility] for better gut side effect profile   Left thigh wound S/p I&D of left thigh with graft excision 10/2. Wound culture growing few corynebacterium striatum. ID consulted, appreciate recommendations.  Vascular surgery recommending to retry wound VAC, patient will reconsider. - wet to dry dressing changes BID - antibiotics as below  Acute left knee pain Concerning for septic arthritis s/p joint aspiration and arthroscopic left knee irrigation and drainage with placement of antibiotic beads.Orthopedics following, appreciate involvement.  - oxycodone 10 mg q6 prn - scheduled Tylenol - IV hydromorphone 1 mg q4 prn - f/u cultures  Fever Unclear etiology but  concern for septic arthritis possible given acute onset of left knee pain and swelling. As above, wound culture growing few corynebacterium striatum. ID following, appreciate involvement.  Per ID, antibiotic plan: Vancomycin and ceftazidime MWF with dialysis for 4 weeks total course.  Will clarify. Last fever 10/4.  - s/p piperacillin-tazobactam (10/4-10/6) - s/p CTX (10/6-10/7) - vancomycin MWF with HD (10/4-) - ceftazidime MWF with HD (10/8-) - Tylenol prn - f/u blood cultures - f/u synovial fluid cultures - f/u left thigh wound cultures   ESRD MWF dialysis, continue while inpatient per nephrology.  Aortic stenosis Severe AS noted on echo this admission. Likely contributing to his symptoms of chest pain and shortness of breath. - outpatient follow-up (sees Dr. Angelena Form)  Insomnia Takes zolpidem prn for sleep, trazodone prn for nightmares. - continue home meds (trazodone reordered)  FEN/GI: renal diet PPx: SCDs  Disposition: med-surg, potentially SNF   Subjective:   Patient states he has not been feeling well last night or this morning. Endorses nausea, and feeling like he is having fever. He is having pain from his left thigh wound. Denies chest pain or dyspnea. He is requesting IV Phenergan.  Objective: Temp:  [98 F (36.7 C)-99.6 F (37.6 C)] 99.6 F (37.6 C) (10/08 2015) Pulse Rate:  [69-91] 69 (10/09 0549) Resp:  [18-20] 20 (10/08 2015) BP: (85-131)/(42-78) 91/49 (10/09 0549) SpO2:  [98 %-100 %] 98 % (10/09 0549) Weight:  [115.9 kg-118.6 kg] 117.1 kg (10/08 2015) Physical Exam: General: obese male, NAD Cardiovascular: RRR, 3/6 systolic murmur Respiratory: CTAB. Normal WOB Abdomen: soft, mild diffuse tenderness to palpation. Normoactive BS Extremities: warm, well perfused. LLE bandage changed this morning. No drainage from site. Some ecchymosis surrounding site.    Laboratory: Recent  Labs  Lab 10/08/19 0115 10/08/19 0739 10/10/19 0352  WBC 6.0 7.8 5.5   HGB 7.9* 7.9* 7.8*  HCT 27.3* 27.4* 27.1*  PLT 123* 129* 164   Recent Labs  Lab 10/08/19 0748 10/09/19 0156 10/10/19 0352 10/10/19 0754  NA 132* 135 135  --   K 6.2* 4.6 5.0  --   CL 95* 94* 98  --   CO2 22 25 25   --   BUN 64* 45* 69*  --   CREATININE 8.78* 6.72* 8.74*  --   CALCIUM 9.0 9.5 9.3  --   PROT  --   --   --  7.3  BILITOT  --   --   --  0.3  ALKPHOS  --   --   --  56  ALT  --   --   --  6  AST  --   --   --  7*  GLUCOSE 103* 107* 108*  --     Imaging/Diagnostic Tests:  Shary Key, DO 10/11/2019, 9:33 AM PGY-1, [place] Intern pager: [phone], text pages welcome

## 2019-10-11 NOTE — Progress Notes (Signed)
  Subjective: Patient is a 54 year old male who is s/p left knee arthroscopy with irrigation and debridement.  Patient notes continued left knee pain but states that it is slowly improving.  Denies any subjective fevers or chills.  He was unable to weight-bear in physical therapy 2 days ago but he is ready to try again today.   Objective: Vital signs in last 24 hours: Temp:  [98 F (36.7 C)-99.6 F (37.6 C)] 99.6 F (37.6 C) (10/08 2015) Pulse Rate:  [69-91] 69 (10/09 0549) Resp:  [18-20] 20 (10/08 2015) BP: (85-131)/(42-78) 91/49 (10/09 0549) SpO2:  [98 %-100 %] 98 % (10/09 0549) Weight:  [115.9 kg-118.6 kg] 117.1 kg (10/08 2015)  Intake/Output from previous day: 10/08 0701 - 10/09 0700 In: 701.2 [P.O.:600; IV Piggyback:101.2] Out: 2707 [Urine:1; Stool:6] Intake/Output this shift: No intake/output data recorded.  Exam:  Left knee with dressings intact.  Effusion present but to a lesser degree than it was several days ago.  Very mild temperature difference with slightly increased warmth of the left knee compared to contralateral side.  No calf tenderness on exam.  Dorsiflexion/plantarflexion intact.  No significant erythema noted.  Labs: Recent Labs    10/10/19 0352  HGB 7.8*   Recent Labs    10/10/19 0352  WBC 5.5  RBC 2.75*  HCT 27.1*  PLT 164   Recent Labs    10/09/19 0156 10/10/19 0352  NA 135 135  K 4.6 5.0  CL 94* 98  CO2 25 25  BUN 45* 69*  CREATININE 6.72* 8.74*  GLUCOSE 107* 108*  CALCIUM 9.5 9.3   No results for input(s): LABPT, INR in the last 72 hours.  Assessment/Plan: Patient is a 54 year old male who is s/p arthroscopic left knee I&D.  Patient stable.  Left knee seems to be improving slowly but steadily.  Continue weightbearing as tolerated to the left leg.  He ambulates 10 to 15 feet at a time at baseline.  Continue PT eval.  Clear for discharge from orthopedic perspective. F/u with Dr. Marlou Sa in clinic one week following discharge   Donella Stade 10/11/2019, 8:56 AM

## 2019-10-12 DIAGNOSIS — M009 Pyogenic arthritis, unspecified: Secondary | ICD-10-CM | POA: Diagnosis not present

## 2019-10-12 DIAGNOSIS — N186 End stage renal disease: Secondary | ICD-10-CM | POA: Diagnosis not present

## 2019-10-12 LAB — CBC
HCT: 29.3 % — ABNORMAL LOW (ref 39.0–52.0)
Hemoglobin: 8.7 g/dL — ABNORMAL LOW (ref 13.0–17.0)
MCH: 29.4 pg (ref 26.0–34.0)
MCHC: 29.7 g/dL — ABNORMAL LOW (ref 30.0–36.0)
MCV: 99 fL (ref 80.0–100.0)
Platelets: 205 10*3/uL (ref 150–400)
RBC: 2.96 MIL/uL — ABNORMAL LOW (ref 4.22–5.81)
RDW: 17.8 % — ABNORMAL HIGH (ref 11.5–15.5)
WBC: 5.5 10*3/uL (ref 4.0–10.5)
nRBC: 0 % (ref 0.0–0.2)

## 2019-10-12 LAB — COMPREHENSIVE METABOLIC PANEL
ALT: 5 U/L (ref 0–44)
AST: 8 U/L — ABNORMAL LOW (ref 15–41)
Albumin: 2.8 g/dL — ABNORMAL LOW (ref 3.5–5.0)
Alkaline Phosphatase: 59 U/L (ref 38–126)
Anion gap: 13 (ref 5–15)
BUN: 59 mg/dL — ABNORMAL HIGH (ref 6–20)
CO2: 24 mmol/L (ref 22–32)
Calcium: 9.1 mg/dL (ref 8.9–10.3)
Chloride: 100 mmol/L (ref 98–111)
Creatinine, Ser: 8.78 mg/dL — ABNORMAL HIGH (ref 0.61–1.24)
GFR, Estimated: 6 mL/min — ABNORMAL LOW (ref >60)
Glucose, Bld: 101 mg/dL — ABNORMAL HIGH (ref 70–99)
Potassium: 4.9 mmol/L (ref 3.5–5.1)
Sodium: 137 mmol/L (ref 135–145)
Total Bilirubin: 0.4 mg/dL (ref 0.3–1.2)
Total Protein: 7.2 g/dL (ref 6.5–8.1)

## 2019-10-12 LAB — AEROBIC/ANAEROBIC CULTURE W GRAM STAIN (SURGICAL/DEEP WOUND): Culture: NO GROWTH

## 2019-10-12 MED ORDER — SODIUM ZIRCONIUM CYCLOSILICATE 10 G PO PACK
10.0000 g | PACK | ORAL | Status: DC
  Administered 2019-10-12 – 2019-10-18 (×4): 10 g via ORAL
  Filled 2019-10-12 (×6): qty 1

## 2019-10-12 MED ORDER — HYDROMORPHONE HCL 1 MG/ML IJ SOLN
1.0000 mg | Freq: Once | INTRAMUSCULAR | Status: AC | PRN
  Administered 2019-10-12: 1 mg via INTRAVENOUS
  Filled 2019-10-12: qty 1

## 2019-10-12 MED ORDER — LOPERAMIDE HCL 2 MG PO CAPS
2.0000 mg | ORAL_CAPSULE | Freq: Once | ORAL | Status: AC
  Administered 2019-10-12: 2 mg via ORAL
  Filled 2019-10-12: qty 1

## 2019-10-12 MED ORDER — CHLORHEXIDINE GLUCONATE CLOTH 2 % EX PADS: 6.0000 | MEDICATED_PAD | Freq: Every day | CUTANEOUS | Status: DC

## 2019-10-12 NOTE — Progress Notes (Signed)
Family Medicine Teaching Service Daily Progress Note Intern Pager: [phone]  Patient name: [name] Medical record number: [id number] Date of birth: Jul 11, 1965 Age: 54 y.o. Gender: male  Primary Care Provider: [name], MD Consultants: Nephrology, vascular surgery, orthopedics Code Status: FULL  Pt Overview and Major Events to Date:  10/1 Admitted, HD attempted (terminated early due to pain) 10/2Inferior venacavogram(right), I&D of left thigh with graft excision, HD 10/4 Joint aspiration left knee 10/5 Arthroscopic left knee irrigation and drainage with placement of antibiotic beads  Assessment and Plan: [name] is a 54 y.o. male presenting with inability to obtain access at his right femoral dialysis catheter while at HD, now with acute knee pain and swelling with fever concerning for septic arthritis. PMH is significant for ESRD on HD (MWF), PAD, kidney stones, spina bifida, hx of nephrostomy tube, HTN, GERD, anxiety.  Diarrhea Improving, possibly due to Colace intolerance and/or antibiotics.  Rectal tube removed this morning. - IV Phenergen 25mg  - loperamide 2mg  once  Left thigh wound S/p I&D of left thigh with graft excision 10/2. Wound culture growing few corynebacterium striatum. ID consulted, appreciate recommendations.  Vascular surgery recommending to retry wound VAC, patient will reconsider. - wet to dry dressing changes BID - antibiotics as below  Acute left knee pain Concerning for septic arthritis s/p joint aspiration and arthroscopic left knee irrigation and drainage with placement of antibiotic beads. Cultures negative.Orthopedics following, appreciate involvement. OK to discharge from an orthopedics standpoint. - oxycodone 10 mg q6 prn - d/c hydromorphone - scheduled Tylenol - PT re-eval tomorrow  Fever Unclear etiology but concern for septic arthritis possible given acute onset of left knee pain and swelling. As above, wound culture  growing few corynebacterium striatum. Blood cultures and synovial fluid cultures negative. ID following, appreciate involvement.  Per ID, antibiotic plan: Vancomycin and ceftazidime MWF with dialysis for 4 weeks total course. Afebrile since 10/4.  - s/p piperacillin-tazobactam (10/4-10/6) - s/p CTX (10/6-10/7) - vancomycin MWF with HD (10/4-) - ceftazidime MWF with HD (10/8-) - Tylenol prn - f/u left thigh wound cultures   ESRD MWF dialysis, continue while inpatient per nephrology.  Lytic lesions Multiple faint subcortical lytic lesions within the humeral head and distal right clavicle on XR right shoulder. 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 - consider serum PTH  PAD At home on ASA 325 mg and clopidogrel. States he takes these so his dialysis catheter doesn't clot. - continue home meds  Aortic stenosis Severe AS noted on echo this admission. Likely contributing to his symptoms of chest pain and shortness of breath. - outpatient follow-up (sees Dr. Angelena Form)  Insomnia Takes zolpidem prn for sleep, trazodone prn for nightmares. - continue home meds (trazodone reordered)  FEN/GI: renal diet PPx: SCDs  Disposition: med-surg, potentially SNF   Subjective:  Patient states left knee pain is improving.  Diarrhea is also improving, states stools are more formed now.  Rectal tube was removed earlier this morning.  Requesting another dose of loperamide and requesting that his Kayexalate be changed to [facility].  He wants another day to recover and does not want PT to reevaluate him until tomorrow.  Wants to be discharged after dialysis tomorrow.  Objective: Temp:  [97.9 F (36.6 C)-99.5 F (37.5 C)] 98.7 F (37.1 C) (10/10 0840) Pulse Rate:  [64-71] 71 (10/10 0840) Resp:  [16-20] 18 (10/10 0840) BP: (91-136)/(45-69) 136/69 (10/10 0840) SpO2:  [99 %-100 %] 99 % (10/10 0840) Weight:  [115.7 kg] 115.7 kg (10/09 2013) Physical Exam:  General:Obese male,  NAD Cardiovascular:RRR, 3/6 systolic murmur best heard at LUSB Respiratory:CTAB Abdomen:soft, mild left-sided tenderness, +BS Extremities:WWP, no edema, left lower extremity with bandage in place, able to lift left leg (limited by pain, but improved from previous)  Laboratory: Recent Labs  Lab 10/10/19 0352 10/11/19 2230 10/12/19 0500  WBC 5.5 5.0 5.5  HGB 7.8* 9.2* 8.7*  HCT 27.1* 31.0* 29.3*  PLT 164 181 205   Recent Labs  Lab 10/10/19 0352 10/10/19 0754 10/11/19 1209 10/12/19 0500  NA 135  --  137 137  K 5.0  --  4.6 4.9  CL 98  --  97* 100  CO2 25  --  25 24  BUN 69*  --  49* 59*  CREATININE 8.74*  --  7.22* 8.78*  CALCIUM 9.3  --  9.4 9.1  PROT  --  7.3 7.3 7.2  BILITOT  --  0.3 0.3 0.4  ALKPHOS  --  56 66 59  ALT  --  6 7 5   AST  --  7* 9* 8*  GLUCOSE 108*  --  91 101*    Imaging/Diagnostic Tests: DG Chest 2 View  Result Date: 10/11/2019 CLINICAL DATA:  Chest pressure and shortness of breath EXAM: CHEST - 2 VIEW COMPARISON:  Radiograph 10/06/2019 FINDINGS: Low volumes with marked central pulmonary vascular congestion, diffuse hazy reticular interstitial opacities, fissural in septal thickening, and central vascular cuffing. No pneumothorax or visible effusion. Cardiac silhouette appears enlarged from comparison semi upright AP radiograph and may reflect cardiomegaly or pericardial effusion. No pneumothorax. No pleural effusion. Edematous changes seen in the soft tissues. The osseous structures appear diffusely demineralized which may be in keeping with patient's history of renal osteodystrophy although a more lytic appearing lesion is seen involving the right humeral head near the greater tuberosity. IMPRESSION: 1. Findings suggestive of volume overload/CHF. 2. 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 A more focal lytic appearing lesion involving the right humeral head about the greater tuberosity is somewhat more conspicuous though may be  on the spectrum of osteodystrophy and hyperparathyroidism possibly reflecting lesion such as brown tumor. Recommend correlation with patient's symptoms and consideration of dedicated shoulder radiography for more appropriate visualization. These results will be called to the ordering clinician or representative by the Radiologist Assistant, and communication documented in the PACS or [employer]. Electronically Signed   By: Lovena Le M.D.   On: 10/11/2019 15:31   DG Shoulder Right  Result Date: 10/11/2019 CLINICAL DATA:  End-stage renal disease, right shoulder lytic lesion EXAM: RIGHT SHOULDER - 2+ VIEW COMPARISON:  CT 10/05/2019 FINDINGS: Evaluation of the right shoulder is slightly limited by suboptimal positioning. There is no definite fracture or dislocation, however. There are multiple faint subcortical lytic lesions demonstrating thin sclerotic margins within the right humeral head. A similar lesion is seen within the distal right clavicle at the acromioclavicular articulation. Given the patient's history of renal insufficiency, these likely represent brown tumors of secondary hyperparathyroidism less likely, these could reflect subchondral cyst in the setting advanced degenerative arthritis. Metastatic disease is not completely excluded, though the multiplicity of lesions only within the humeral head would argue against this. Myeloma typically would not demonstrate the sclerotic margins identified. Visualized right thorax is unremarkable. 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 Correlation with serum parathyroid hormone level may be helpful. Electronically Signed   By: Fidela Salisbury  MD   On: 10/11/2019 19:58     Zola Button, MD 10/12/2019, 10:02 AM PGY-1, [place] Intern pager: [phone], text pages welcome

## 2019-10-12 NOTE — Plan of Care (Signed)
  Problem: Education: Goal: Knowledge of General Education information will improve Description: Including pain rating scale, medication(s)/side effects and non-pharmacologic comfort measures Outcome: Progressing   Problem: Pain Managment: Goal: General experience of comfort will improve Outcome: Progressing   

## 2019-10-12 NOTE — Progress Notes (Signed)
FMTS Brief Note  In to see patient, discussed importance of working with PT. Will continue PO pain medication with single IV dose hydromorphone prior to PT.  See attestation for exam.   Dorris Singh, MD  [facility] Medicine Teaching Service

## 2019-10-12 NOTE — Progress Notes (Signed)
   Left groin wound clean and dry at the base with wet-to-dry dressing replaced.  Patient pleasantly refusing VAC at this time.  Catheter working well right groin no overt signs of infection, would be very difficult to replace would not want to lose access from this vein as is his last access.  Okay for discharge from vascular standpoint with dressing changes arranged.  Saul Fabiano C. Donzetta Matters, MD [facility] Office: [phone] Pager: [phone]

## 2019-10-12 NOTE — Progress Notes (Signed)
[name] Progress Note   Subjective:   Patient seen and examined at bedside.  Continues to have abdominal cramping but diarrhea is slightly improved.  Knee pain better, able to bend but can not flex to full extension.  Still debating on whether to have wound vac placed back on L wound - advised he follow the recommendations from [facility]. Concerned about chest tightness which occurred the other day - going to try to move up outpatient follow up.  Complains of pain around his shoulder blades with deep inhalation but thinks it could be due to past shoulder injury. Denies SOB, vomiting, dizziness and fatigue.  Request changing kayexalate to lokelma - ordered.   Objective Vitals:   10/11/19 2013 10/12/19 0427 10/12/19 0427 10/12/19 0840  BP: (!) 91/54  (!) 91/45 136/69  Pulse: 67  64 71  Resp: 16   18  Temp: 99.1 F (37.3 C) 97.9 F (36.6 C)  98.7 F (37.1 C)  TempSrc: Oral Oral  Oral  SpO2: 100%  100% 99%  Weight: 115.7 kg     Height:       Physical Exam General:chronically ill appearing obese, male in NAD Heart:RRR, +6/0 systolic murmur Lungs:CTAB Abdomen:soft, ND, +tenderness to epigastric and LLQ Extremities:trace LE edema, L knee wrapped, L thigh wound dressed Dialysis Access: R thigh [facility]   Filed Weights   10/10/19 1420 10/10/19 2015 10/11/19 2013  Weight: 115.9 kg 117.1 kg 115.7 kg    Intake/Output Summary (Last 24 hours) at 10/12/2019 1033 Last data filed at 10/12/2019 0828 Gross per 24 hour  Intake 1080 ml  Output 1 ml  Net 1079 ml    Additional Objective Labs: Basic Metabolic Panel: Recent Labs  Lab 10/08/19 0748 10/08/19 0748 10/09/19 0156 10/09/19 0156 10/10/19 0352 10/11/19 1209 10/12/19 0500  NA 132*   < > 135   < > 135 137 137  K 6.2*   < > 4.6   < > 5.0 4.6 4.9  CL 95*   < > 94*   < > 98 97* 100  CO2 22   < > 25   < > 25 25 24   GLUCOSE 103*   < > 107*   < > 108* 91 101*  BUN 64*   < > 45*   < > 69* 49* 59*  CREATININE 8.78*   < >  6.72*   < > 8.74* 7.22* 8.78*  CALCIUM 9.0   < > 9.5   < > 9.3 9.4 9.1  PHOS 8.6*  --  5.2*  --  5.6*  --   --    < > = values in this interval not displayed.   Liver Function Tests: Recent Labs  Lab 10/10/19 0754 10/11/19 1209 10/12/19 0500  AST 7* 9* 8*  ALT 6 7 5   ALKPHOS 56 66 59  BILITOT 0.3 0.3 0.4  PROT 7.3 7.3 7.2  ALBUMIN 2.7* 2.9* 2.8*   CBC: Recent Labs  Lab 10/06/19 0152 10/07/19 0430 10/08/19 0115 10/08/19 0115 10/08/19 0739 10/08/19 0739 10/10/19 0352 10/11/19 2230 10/12/19 0500  WBC 9.8   < > 6.0   < > 7.8   < > 5.5 5.0 5.5  NEUTROABS 8.5*  --   --   --   --   --   --  3.5  --   HGB 8.9*   < > 7.9*   < > 7.9*   < > 7.8* 9.2* 8.7*  HCT 30.8*   < >  27.3*   < > 27.4*   < > 27.1* 31.0* 29.3*  MCV 98.1   < > 99.6  --  98.6  --  98.5 98.4 99.0  PLT 159   < > 123*   < > 129*   < > 164 181 205   < > = values in this interval not displayed.   Blood Culture    Component Value Date/Time   SDES FLUID 10/07/2019 1517   SPECREQUEST LEFT KNEE JOINT SPEC A 10/07/2019 1517   CULT  10/07/2019 1517    NO GROWTH 4 DAYS NO ANAEROBES ISOLATED; CULTURE IN PROGRESS FOR 5 DAYS Performed at [facility], [place] [address], [place], [place] [address]    REPTSTATUS PENDING 10/07/2019 1517    Studies/Results: DG Chest 2 View  Result Date: 10/11/2019 CLINICAL DATA:  Chest pressure and shortness of breath EXAM: CHEST - 2 VIEW COMPARISON:  Radiograph 10/06/2019 FINDINGS: Low volumes with marked central pulmonary vascular congestion, diffuse hazy reticular interstitial opacities, fissural in septal thickening, and central vascular cuffing. No pneumothorax or visible effusion. Cardiac silhouette appears enlarged from comparison semi upright AP radiograph and may reflect cardiomegaly or pericardial effusion. No pneumothorax. No pleural effusion. Edematous changes seen in the soft tissues. The osseous structures appear diffusely demineralized which may be in keeping with patient's  history of renal osteodystrophy although a more lytic appearing lesion is seen involving the right humeral head near the greater tuberosity. IMPRESSION: 1. Findings suggestive of volume overload/CHF. 2. 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 A more focal lytic appearing lesion involving the right humeral head about the greater tuberosity is somewhat more conspicuous though may be on the spectrum of osteodystrophy and hyperparathyroidism possibly reflecting lesion such as brown tumor. Recommend correlation with patient's symptoms and consideration of dedicated shoulder radiography for more appropriate visualization. These results will be called to the ordering clinician or representative by the Radiologist Assistant, and communication documented in the PACS or [employer]. Electronically Signed   By: Lovena Le M.D.   On: 10/11/2019 15:31   DG Shoulder Right  Result Date: 10/11/2019 CLINICAL DATA:  End-stage renal disease, right shoulder lytic lesion EXAM: RIGHT SHOULDER - 2+ VIEW COMPARISON:  CT 10/05/2019 FINDINGS: Evaluation of the right shoulder is slightly limited by suboptimal positioning. There is no definite fracture or dislocation, however. There are multiple faint subcortical lytic lesions demonstrating thin sclerotic margins within the right humeral head. A similar lesion is seen within the distal right clavicle at the acromioclavicular articulation. Given the patient's history of renal insufficiency, these likely represent brown tumors of secondary hyperparathyroidism less likely, these could reflect subchondral cyst in the setting advanced degenerative arthritis. Metastatic disease is not completely excluded, though the multiplicity of lesions only within the humeral head would argue against this. Myeloma typically would not demonstrate the sclerotic margins identified. Visualized right thorax is unremarkable. 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 Correlation with serum parathyroid hormone level may be helpful. Electronically Signed   By: Fidela Salisbury MD   On: 10/11/2019 19:58    Medications: . cefTAZidime (FORTAZ)  IV 2 g (10/10/19 1511)  . vancomycin 1,000 mg (10/10/19 1313)   . acetaminophen  1,000 mg Oral Q8H  . aspirin EC  325 mg Oral Daily  . calcium acetate  2,001 mg Oral With snacks  . calcium acetate  2,668  mg Oral TID with meals  . Chlorhexidine Gluconate Cloth  6 each Topical Q0600  . clopidogrel  75 mg Oral Daily  . [START ON 10/13/2019] darbepoetin (ARANESP) injection - DIALYSIS  200 mcg Intravenous Q Mon-HD  . doxercalciferol  3 mcg Intravenous Q M,W,F-HD  . famotidine  10 mg Oral Q M,W,F-2000  . heparin  5,000 Units Subcutaneous Q8H  . levothyroxine  137 mcg Oral QAC breakfast  . lidocaine  2 patch Transdermal Daily  . loperamide  2 mg Oral Once  . pantoprazole  40 mg Oral QAC breakfast  . sodium zirconium cyclosilicate  10 g Oral QODAY    [facility] Optiflux 200NRe, Time: 4hr 71min, BFR 350, DFR 800, EDW 114.5kg, 2K/2.,25Ca, TDC heparin 3700 unit bolus Mircera 2100mcg IV q 2 weeks- last dose 9/27 Venofer 100mg  q HD -last dose due today Phoslo 4 caps TO TID with meals and 3 with snacks Hectorol 3 mcg IV q HD Kayexalate 15g PO on non-dialysis days  Assessment/Plan: 1. ESRD:HD MWF. S/p [facility] manipulation by Dr. Trula Slade 10/2 as well as L thigh debridement with old AVG removal.TDC running better since intervention, giving cath flo post each HD. Access options limited. Next HD tomorrow.  2. Hyperkalemia:Recurrent issue -pt takes kayexalate on non-dialysis today - changing to lokelma d/t issues with diarrhea.K 4.9 today. 3. Fevers. Blood cultures neg to date. Possible sources of infection include: dialysis catheter,  left thigh wound, septic knee-- wound cultures neg to date. IV antibiotics per primary team. tmax 99.5 x24hrs 4. L thigh wound/infected old AVG: S/p excision/debridement 10/2 -wet to dry dressings. Wound vac d/c due to dysfxn, may try again. Per Vascular. 5. L knee pain/effusion - s/p joint aspiration 10/4. Concern for septic arthrits 25K WBCs insynovialfluid. To OR for I &D 10/5 --Culturesneg to date. 6. Hypertension/volume:BPlow/stable.Not getting to EDW, continue to UF as tolerated. CXR shows volume overload, lungs sound clear, will increase UF goal to 3-4L tomorrow.Reinforced fluid restrictions. Will try to get standing weight post HD tomorrow. 7. Anemia:Hgb^8.7. ESA dosed 9/27,due Monday - ordered. 8. Metabolic bone disease:Ca/Phos ok. Continue hectorol and phoslo. 9. Nutrition:Renal diet with 1.2L fluid restriction recommended. Alb 2.8 10. L flank wound:D/t prior PCN tube - some erythema, no drainage - pt reports this is improved, follow closely. 11. Dispo: Pending 12. Diarrhea - per pt common when taking colace.  D/c colace  13. ?Brown's tumor - multiple lytic lesions on humeral head and distal right clavicle noted on shoulder xray.  Last pth in goal at 368 on 09/17/19.  Jen Mow, PA-C [name] 10/12/2019,10:33 AM  LOS: 8 days

## 2019-10-12 NOTE — Progress Notes (Addendum)
Physical Therapy Treatment Patient Details Name: [name] MRN: [id number] DOB: 08-Aug-1965 Today's Date: 10/12/2019    History of Present Illness [name] is a 54 y.o. male presenting with inability to obtain access at his right femoral dialysis catheter while at HD, now with acute knee pain and swelling with fever concerning for septic arthritis. PMH is significant for ESRD on HD (MWF), PAD, kidney stones, spina bifida, hx of nephrostomy tube, HTN, GERD, anxiety 10/1 Admitted, HD attempted (terminated early due to pain); 10/2Inferior venacavogram(right), I&D of left thigh with graft excision, HD, 10/4 Joint aspiration left knee; 10/5 Arthroscopic left knee irrigation and drainage with placement of antibiotic beads.     PT Comments    Pt progressing well towards his physical therapy goals. Still requiring IV pain medication prior to mobilizing, but able to transition to standing and take several steps forward and back x 2 trials without physical assist. Also reviewed supine/seated therapeutic exercises and cryotherapy. Pt is a limited household ambulator at baseline and would like to return home at discharge; I think this is appropriate given progression today with adequate pain control and management. Pt is also well equipped with appropriate DME to mobilize at a wheelchair level as well (has both a w/c for inside and outside). Encouraged him to have supervision/assist when entering/exiting his home for his dialysis appointment. Will benefit from follow up HHPT to address deficits and maximize functional independence.     Follow Up Recommendations  Home health PT     Equipment Recommendations  Rolling walker with 5" wheels;Other (comment) (bariatric)    Recommendations for Other Services       Precautions / Restrictions Precautions Precautions: Fall Restrictions Weight Bearing Restrictions: No    Mobility  Bed Mobility Overal bed mobility: Needs Assistance Bed  Mobility: Supine to Sit;Sit to Supine     Supine to sit: Supervision Sit to supine: Supervision   General bed mobility comments: No physical assist required, increased time/effort  Transfers Overall transfer level: Needs assistance Equipment used: Rolling walker (2 wheeled) Transfers: Sit to/from Stand Sit to Stand: Supervision         General transfer comment: Supervision to rise from elevated bed surface, cues for hand placement. Very painful  Ambulation/Gait Ambulation/Gait assistance: Min guard Gait Distance (Feet): 4 Feet Assistive device: Rolling walker (2 wheeled) Gait Pattern/deviations: Step-to pattern;Antalgic;Trunk flexed;Decreased stance time - right;Decreased weight shift to right Gait velocity: decreased Gait velocity interpretation: <1.31 ft/sec, indicative of household ambulator General Gait Details: Heavily antalgic gait pattern, increased trunk flexion, decreased reliance through LLE. Cues for upright posture. Pt able to take 2 steps forward and back the first attempt and 4 steps forward and backwards second attempt   Stairs             Wheelchair Mobility    Modified Rankin (Stroke Patients Only)       Balance Overall balance assessment: Needs assistance Sitting-balance support: Bilateral upper extremity supported;Feet supported Sitting balance-Leahy Scale: Fair Sitting balance - Comments: dependent on BUE but no external support needed   Standing balance support: Bilateral upper extremity supported Standing balance-Leahy Scale: Poor                              Cognition Arousal/Alertness: Awake/alert Behavior During Therapy: WFL for tasks assessed/performed Overall Cognitive Status: Within Functional Limits for tasks assessed  Exercises General Exercises - Lower Extremity Quad Sets: Left;5 reps;Supine Long Arc Quad: Left;5 reps;Seated Heel Slides: Left;5  reps;Supine    General Comments        Pertinent Vitals/Pain Pain Assessment: Faces Faces Pain Scale: Hurts whole lot Pain Location: L knee Pain Descriptors / Indicators: Grimacing;Guarding;Sharp Pain Intervention(s): Limited activity within patient's tolerance;Monitored during session;Premedicated before session;Ice applied    Home Living                      Prior Function            PT Goals (current goals can now be found in the care plan section) Acute Rehab PT Goals Patient Stated Goal: to get knee to have less pain, be able to walk again PT Goal Formulation: With patient Time For Goal Achievement: 10/23/19 Potential to Achieve Goals: Good Progress towards PT goals: Progressing toward goals    Frequency    Min 3X/week      PT Plan Discharge plan needs to be updated;Frequency needs to be updated    Co-evaluation              AM-PAC PT "6 Clicks" Mobility   Outcome Measure  Help needed turning from your back to your side while in a flat bed without using bedrails?: None Help needed moving from lying on your back to sitting on the side of a flat bed without using bedrails?: None Help needed moving to and from a bed to a chair (including a wheelchair)?: A Little Help needed standing up from a chair using your arms (e.g., wheelchair or bedside chair)?: None Help needed to walk in hospital room?: A Little Help needed climbing 3-5 steps with a railing? : Total 6 Click Score: 19    End of Session Equipment Utilized During Treatment: Gait belt Activity Tolerance: Patient limited by pain Patient left: in bed;with call bell/phone within reach Nurse Communication: Mobility status PT Visit Diagnosis: Unsteadiness on feet (R26.81);Difficulty in walking, not elsewhere classified (R26.2);Pain;Muscle weakness (generalized) (M62.81) Pain - Right/Left: Left Pain - part of body: Knee     Time: 1256-1340 PT Time Calculation (min) (ACUTE ONLY): 44  min  Charges:  $Gait Training: 8-22 mins $Therapeutic Activity: 23-37 mins                     [name], PT, DPT Acute Rehabilitation Services Pager [phone] Office [phone]    [name] 10/12/2019, 3:31 PM

## 2019-10-13 DIAGNOSIS — E875 Hyperkalemia: Secondary | ICD-10-CM | POA: Diagnosis not present

## 2019-10-13 DIAGNOSIS — R197 Diarrhea, unspecified: Secondary | ICD-10-CM

## 2019-10-13 DIAGNOSIS — M009 Pyogenic arthritis, unspecified: Secondary | ICD-10-CM | POA: Diagnosis not present

## 2019-10-13 LAB — COMPREHENSIVE METABOLIC PANEL
ALT: 6 U/L (ref 0–44)
AST: 7 U/L — ABNORMAL LOW (ref 15–41)
Albumin: 2.7 g/dL — ABNORMAL LOW (ref 3.5–5.0)
Alkaline Phosphatase: 68 U/L (ref 38–126)
Anion gap: 14 (ref 5–15)
BUN: 75 mg/dL — ABNORMAL HIGH (ref 6–20)
CO2: 24 mmol/L (ref 22–32)
Calcium: 9.3 mg/dL (ref 8.9–10.3)
Chloride: 99 mmol/L (ref 98–111)
Creatinine, Ser: 10.7 mg/dL — ABNORMAL HIGH (ref 0.61–1.24)
GFR, Estimated: 5 mL/min — ABNORMAL LOW (ref >60)
Glucose, Bld: 83 mg/dL (ref 70–99)
Potassium: 5.5 mmol/L — ABNORMAL HIGH (ref 3.5–5.1)
Sodium: 137 mmol/L (ref 135–145)
Total Bilirubin: 0.8 mg/dL (ref 0.3–1.2)
Total Protein: 7.1 g/dL (ref 6.5–8.1)

## 2019-10-13 LAB — CBC
HCT: 28.1 % — ABNORMAL LOW (ref 39.0–52.0)
Hemoglobin: 8.5 g/dL — ABNORMAL LOW (ref 13.0–17.0)
MCH: 29.7 pg (ref 26.0–34.0)
MCHC: 30.2 g/dL (ref 30.0–36.0)
MCV: 98.3 fL (ref 80.0–100.0)
Platelets: 193 10*3/uL (ref 150–400)
RBC: 2.86 MIL/uL — ABNORMAL LOW (ref 4.22–5.81)
RDW: 17.6 % — ABNORMAL HIGH (ref 11.5–15.5)
WBC: 5.9 10*3/uL (ref 4.0–10.5)
nRBC: 0 % (ref 0.0–0.2)

## 2019-10-13 MED ORDER — LOPERAMIDE HCL 2 MG PO CAPS
2.0000 mg | ORAL_CAPSULE | ORAL | Status: AC | PRN
  Administered 2019-10-14: 2 mg via ORAL
  Filled 2019-10-13: qty 1

## 2019-10-13 MED ORDER — LOPERAMIDE HCL 2 MG PO CAPS
4.0000 mg | ORAL_CAPSULE | Freq: Once | ORAL | Status: AC
  Administered 2019-10-13: 4 mg via ORAL
  Filled 2019-10-13: qty 2

## 2019-10-13 MED ORDER — DARBEPOETIN ALFA 200 MCG/0.4ML IJ SOSY
PREFILLED_SYRINGE | INTRAMUSCULAR | Status: AC
  Administered 2019-10-13: 200 ug via INTRAVENOUS
  Filled 2019-10-13: qty 0.4

## 2019-10-13 MED ORDER — DOXERCALCIFEROL 4 MCG/2ML IV SOLN
INTRAVENOUS | Status: AC
  Filled 2019-10-13: qty 2

## 2019-10-13 MED ORDER — VANCOMYCIN HCL IN DEXTROSE 1-5 GM/200ML-% IV SOLN
INTRAVENOUS | Status: AC
  Administered 2019-10-13: 1000 mg via INTRAVENOUS
  Filled 2019-10-13: qty 200

## 2019-10-13 MED ORDER — PSYLLIUM 95 % PO PACK
1.0000 | PACK | Freq: Every day | ORAL | Status: DC
  Administered 2019-10-13 – 2019-10-18 (×4): 1 via ORAL
  Filled 2019-10-13 (×7): qty 1

## 2019-10-13 MED ORDER — HEPARIN SODIUM (PORCINE) 1000 UNIT/ML IJ SOLN
INTRAMUSCULAR | Status: AC
  Administered 2019-10-13: 3000 [IU] via INTRAVENOUS_CENTRAL
  Filled 2019-10-13: qty 3

## 2019-10-13 MED ORDER — PROMETHAZINE HCL 25 MG/ML IJ SOLN
INTRAMUSCULAR | Status: AC
  Filled 2019-10-13: qty 1

## 2019-10-13 MED ORDER — ALTEPLASE 2 MG IJ SOLR
INTRAMUSCULAR | Status: AC
  Administered 2019-10-13: 6 mg via INTRAVENOUS_CENTRAL
  Filled 2019-10-13: qty 4

## 2019-10-13 NOTE — Progress Notes (Signed)
Left great toe

## 2019-10-13 NOTE — Progress Notes (Signed)
Family Medicine Teaching Service Daily Progress Note Intern Pager: [phone]  Patient name: [name] Medical record number: [id number] Date of birth: September 07, 1965 Age: 54 y.o. Gender: male  Primary Care Provider: [name], MD Consultants: Nephrology, vascular surgery, orthopedics Code Status: FULL  Pt Overview and Major Events to Date:  10/1 Admitted, HD attempted (terminated early due to pain) 10/2Inferior venacavogram(right), I&D of left thigh with graft excision, HD 10/4 Joint aspiration left knee 10/5 Arthroscopic left knee irrigation and drainage with placement of antibiotic beads  Assessment and Plan: [name] is a 54 y.o. male presenting with inability to obtain access at his right femoral dialysis catheter while at HD, now with acute knee pain and swelling with fever concerning for septic arthritis. PMH is significant for ESRD on HD (MWF), PAD, kidney stones, spina bifida, hx of nephrostomy tube, HTN, GERD, anxiety.  Diarrhea Possibly due to Colace or antibiotics. Still having diarrhea with nausea.  Had 3 episodes of loose stool yesterday after rectal tube was removed in the morning. - IV Phenergen 25mg  - consider loperamide prn - consider probiotics  Left thigh wound S/p I&D of left thigh with graft excision 10/2. Wound culture growing few corynebacterium striatum. ID consulted, appreciate recommendations.  Vascular surgery recommending to retry wound VAC, patient will reconsider. - wet to dry dressing changes BID - antibiotics as below  Acute left knee pain Concerning for septic arthritis s/p joint aspiration and arthroscopic left knee irrigation and drainage with placement of antibiotic beads. Cultures negative.Orthopedics following, appreciate involvement. OK to discharge from an orthopedics standpoint.  Overall improving. - oxycodone 10 mg q6 prn - scheduled Tylenol  Fever Unclear etiology but concern for septic arthritis possible given acute  onset of left knee pain and swelling. As above, wound culture growing few corynebacterium striatum. Blood cultures and synovial fluid cultures negative. ID following, appreciate involvement.  Per ID, antibiotic plan: Vancomycin and ceftazidime MWF with dialysis for 4 weeks total course. Afebrile since 10/4.  - s/p piperacillin-tazobactam (10/4-10/6) - s/p CTX (10/6-10/7) - vancomycin MWF with HD (10/4-) - ceftazidime MWF with HD (10/8-) - Tylenol prn  ESRD MWF dialysis, continue while inpatient per nephrology.  PAD At home on ASA 325 mg and clopidogrel. States he takes these so his dialysis catheter doesn't clot. - continue home meds  Aortic stenosis Severe AS noted on echo this admission. Likely contributing to his symptoms of chest pain and shortness of breath. - outpatient follow-up (sees Dr. Angelena Form)  Insomnia Takes zolpidem prn for sleep, trazodone prn for nightmares. - continue home meds (trazodone reordered)  FEN/GI: renal diet PPx: SCDs  Disposition: med-surg, discharge home with home health PT possibly later today or tomorrow  Subjective:  Seen in HD this morning. Patient states left knee pain is improving.  He is primarily concerned about his diarrhea.  He had 3 episodes of loose stool (not watery) yesterday after rectal tube was removed in the morning.  Also having more nausea.  Does not feel comfortable going home until diarrhea is improved.  Objective: Temp:  [98.6 F (37 C)-99.4 F (37.4 C)] 98.7 F (37.1 C) (10/11 0420) Pulse Rate:  [70-72] 70 (10/11 0420) Resp:  [12-24] 20 (10/11 0826) BP: (104-136)/(55-85) 105/74 (10/11 0826) SpO2:  [99 %-100 %] 99 % (10/11 0420) Weight:  [118.8 kg] 118.8 kg (10/10 2044) Physical Exam: General:Obese male, NAD Cardiovascular:RRR, 3/6 systolic murmur best heard at LUSB Respiratory:CTAB Abdomen:soft, mild left-sided tenderness, +BS Extremities:WWP, no edema, left lower extremity with bandage in place, left  lower  extremity ROM limited by pain  Laboratory: Recent Labs  Lab 10/11/19 2230 10/12/19 0500 10/13/19 0143  WBC 5.0 5.5 5.9  HGB 9.2* 8.7* 8.5*  HCT 31.0* 29.3* 28.1*  PLT 181 205 193   Recent Labs  Lab 10/11/19 1209 10/12/19 0500 10/13/19 0143  NA 137 137 137  K 4.6 4.9 5.5*  CL 97* 100 99  CO2 25 24 24   BUN 49* 59* 75*  CREATININE 7.22* 8.78* 10.70*  CALCIUM 9.4 9.1 9.3  PROT 7.3 7.2 7.1  BILITOT 0.3 0.4 0.8  ALKPHOS 66 59 68  ALT 7 5 6   AST 9* 8* 7*  GLUCOSE 91 101* 83    Imaging/Diagnostic Tests: No results found.   Zola Button, MD 10/13/2019, 8:54 AM PGY-1, [place] Intern pager: [phone], text pages welcome

## 2019-10-13 NOTE — Plan of Care (Signed)
  Problem: Education: Goal: Knowledge of General Education information will improve Description: Including pain rating scale, medication(s)/side effects and non-pharmacologic comfort measures Outcome: Progressing   Problem: Pain Managment: Goal: General experience of comfort will improve Outcome: Progressing   

## 2019-10-13 NOTE — Progress Notes (Signed)
[facility] Progress Note   Subjective:    Seen in HD unit. UF goal set for 4.6L. Patient reports feeling lightheaded this am with abdominal cramping. Also endorses continued diarrhea.  SBPs 90s. Will lower UF goal.    Objective Vitals:   10/13/19 0741 10/13/19 0756 10/13/19 0812 10/13/19 0826  BP: 122/68 116/64 (!) 104/57 105/74  Pulse:      Resp: 19 12 16 20   Temp:      TempSrc:      SpO2:      Weight:      Height:       Physical Exam General:chronically ill appearing obese, male in NAD Heart:RRR, +3/3 systolic murmur Lungs:CTAB Abdomen:soft, ND, +tenderness to epigastric and LLQ Extremities:trace LE edema, L knee wrapped, L thigh wound dressed Dialysis Access: R thigh [facility]   Filed Weights   10/10/19 2015 10/11/19 2013 10/12/19 2044  Weight: 117.1 kg 115.7 kg 118.8 kg    Intake/Output Summary (Last 24 hours) at 10/13/2019 0841 Last data filed at 10/13/2019 0611 Gross per 24 hour  Intake 540 ml  Output 0 ml  Net 540 ml    Additional Objective Labs: Basic Metabolic Panel: Recent Labs  Lab 10/08/19 0748 10/08/19 0748 10/09/19 0156 10/09/19 0156 10/10/19 0352 10/10/19 0352 10/11/19 1209 10/12/19 0500 10/13/19 0143  NA 132*   < > 135   < > 135   < > 137 137 137  K 6.2*   < > 4.6   < > 5.0   < > 4.6 4.9 5.5*  CL 95*   < > 94*   < > 98   < > 97* 100 99  CO2 22   < > 25   < > 25   < > 25 24 24   GLUCOSE 103*   < > 107*   < > 108*   < > 91 101* 83  BUN 64*   < > 45*   < > 69*   < > 49* 59* 75*  CREATININE 8.78*   < > 6.72*   < > 8.74*   < > 7.22* 8.78* 10.70*  CALCIUM 9.0   < > 9.5   < > 9.3   < > 9.4 9.1 9.3  PHOS 8.6*  --  5.2*  --  5.6*  --   --   --   --    < > = values in this interval not displayed.   Liver Function Tests: Recent Labs  Lab 10/11/19 1209 10/12/19 0500 10/13/19 0143  AST 9* 8* 7*  ALT 7 5 6   ALKPHOS 66 59 68  BILITOT 0.3 0.4 0.8  PROT 7.3 7.2 7.1  ALBUMIN 2.9* 2.8* 2.7*   CBC: Recent Labs  Lab 10/08/19 0739  10/08/19 0739 10/10/19 0352 10/10/19 0352 10/11/19 2230 10/12/19 0500 10/13/19 0143  WBC 7.8   < > 5.5   < > 5.0 5.5 5.9  NEUTROABS  --   --   --   --  3.5  --   --   HGB 7.9*   < > 7.8*   < > 9.2* 8.7* 8.5*  HCT 27.4*   < > 27.1*   < > 31.0* 29.3* 28.1*  MCV 98.6  --  98.5  --  98.4 99.0 98.3  PLT 129*   < > 164   < > 181 205 193   < > = values in this interval not displayed.   Blood Culture    Component  Value Date/Time   SDES FLUID 10/07/2019 1517   SPECREQUEST LEFT KNEE JOINT SPEC A 10/07/2019 1517   CULT  10/07/2019 1517    No growth aerobically or anaerobically. Performed at [facility], [place] [address], [place], [place] [address]    REPTSTATUS 10/12/2019 FINAL 10/07/2019 1517    Studies/Results: DG Chest 2 View  Result Date: 10/11/2019 CLINICAL DATA:  Chest pressure and shortness of breath EXAM: CHEST - 2 VIEW COMPARISON:  Radiograph 10/06/2019 FINDINGS: Low volumes with marked central pulmonary vascular congestion, diffuse hazy reticular interstitial opacities, fissural in septal thickening, and central vascular cuffing. No pneumothorax or visible effusion. Cardiac silhouette appears enlarged from comparison semi upright AP radiograph and may reflect cardiomegaly or pericardial effusion. No pneumothorax. No pleural effusion. Edematous changes seen in the soft tissues. The osseous structures appear diffusely demineralized which may be in keeping with patient's history of renal osteodystrophy although a more lytic appearing lesion is seen involving the right humeral head near the greater tuberosity. IMPRESSION: 1. Findings suggestive of volume overload/CHF. 2. 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 A more focal lytic appearing lesion involving the right humeral head about the greater tuberosity is somewhat more conspicuous though may be on the spectrum of osteodystrophy and hyperparathyroidism possibly reflecting lesion such as brown tumor.  Recommend correlation with patient's symptoms and consideration of dedicated shoulder radiography for more appropriate visualization. These results will be called to the ordering clinician or representative by the Radiologist Assistant, and communication documented in the PACS or [employer]. Electronically Signed   By: Lovena Le M.D.   On: 10/11/2019 15:31   DG Shoulder Right  Result Date: 10/11/2019 CLINICAL DATA:  End-stage renal disease, right shoulder lytic lesion EXAM: RIGHT SHOULDER - 2+ VIEW COMPARISON:  CT 10/05/2019 FINDINGS: Evaluation of the right shoulder is slightly limited by suboptimal positioning. There is no definite fracture or dislocation, however. There are multiple faint subcortical lytic lesions demonstrating thin sclerotic margins within the right humeral head. A similar lesion is seen within the distal right clavicle at the acromioclavicular articulation. Given the patient's history of renal insufficiency, these likely represent brown tumors of secondary hyperparathyroidism less likely, these could reflect subchondral cyst in the setting advanced degenerative arthritis. Metastatic disease is not completely excluded, though the multiplicity of lesions only within the humeral head would argue against this. Myeloma typically would not demonstrate the sclerotic margins identified. Visualized right thorax is unremarkable. 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 Correlation with serum parathyroid hormone level may be helpful. Electronically Signed   By: Fidela Salisbury MD   On: 10/11/2019 19:58    Medications: . cefTAZidime (FORTAZ)  IV 2 g (10/10/19 1511)  . vancomycin 1,000 mg (10/10/19 1313)   . acetaminophen  1,000 mg Oral Q8H  . aspirin EC  325 mg Oral Daily  . calcium acetate  2,001 mg Oral With snacks  . calcium  acetate  2,668 mg Oral TID with meals  . Chlorhexidine Gluconate Cloth  6 each Topical Q0600  . clopidogrel  75 mg Oral Daily  . darbepoetin (ARANESP) injection - DIALYSIS  200 mcg Intravenous Q Mon-HD  . doxercalciferol      . doxercalciferol  3 mcg Intravenous Q M,W,F-HD  . famotidine  10 mg Oral Q M,W,F-2000  . heparin  5,000 Units Subcutaneous Q8H  . levothyroxine  137 mcg Oral QAC  breakfast  . lidocaine  2 patch Transdermal Daily  . pantoprazole  40 mg Oral QAC breakfast  . promethazine      . sodium zirconium cyclosilicate  10 g Oral QODAY    [facility] Optiflux 200NRe, Time: 4hr 12min, BFR 350, DFR 800, EDW 114.5kg, 2K/2.,25Ca, TDC heparin 3700 unit bolus Mircera 248mcg IV q 2 weeks- last dose 9/27 Venofer 100mg  q HD -last dose due today Phoslo 4 caps TO TID with meals and 3 with snacks Hectorol 3 mcg IV q HD Kayexalate 15g PO on non-dialysis days  Assessment/Plan: 1. ESRD:HD MWF. S/p [facility] manipulation by Dr. Trula Slade 10/2 as well as L thigh debridement with old AVG removal.TDC running better since intervention, giving cath flo post each HD. Access options limited. HD today on schedule.  2. Hyperkalemia:Recurrent issue -pt takes kayexalate on non-dialysis today - changing to lokelma d/t issues with diarrhea. 3. Fevers. Blood cultures neg to date. Possible sources of infection include: dialysis catheter, left thigh wound, septic knee-- wound cultures neg to date. IV antibiotics per primary team. Per ID continue Vanc/Forta 4. L thigh wound/infected old AVG: S/p excision/debridement 10/2 -wet to dry dressings. Wound vac d/c due to dysfxn, may try again. Per Vascular. 5. L knee pain/effusion - s/p joint aspiration 10/4. Concern for septic arthrits 25K WBCs insynovialfluid. To OR for I &D 10/5 --Culturesneg to date. 6. Hypertension/volume:BPlow/stable.Not getting to EDW, continue to UF as tolerated. CXR shows volume  overload, lungs sound clear, will increase UF goal to 3-4L Reinforced fluid restrictions. Will try to get standing weight post HD tomorrow. 7. Anemia:Hgb 8.5 . Aranesp 200 dosed 00/17.  8. Metabolic bone disease:Ca/Phos ok. Continue hectorol and phoslo. 9. Nutrition:Renal diet with 1.2L fluid restriction recommended. Alb 2.8 10. L flank wound:D/t prior PCN tube - some erythema, no drainage - pt reports this is improved, follow closely. 11. Diarrhea -D/c colace  12. Dispo -pending    Lynnda Child PA-C [facility] 10/13/2019,8:41 AM

## 2019-10-13 NOTE — Progress Notes (Signed)
Assessed patient at bedside again this afternoon.  Patient has not had any further episodes of diarrhea since this morning but does not feel ready to be discharged today.  He received another dose of loperamide earlier today.  Discussed with patient that he can request as needed loperamide after episodes of loose stools if he still continues to have them.  Also discussed adding on psyllium, patient stated that he will try it but usually does not tolerate the unflavored one well due to gag reflex.  Discussed with patient that if he changes his mind and feels ready to go home today, he can notify his nurse and we will complete the discharge process.  Still having some intermittent chest pain which he is concerned about.  On exam, chest pain reproducible with palpation.  Suspect chest pain is due to MSK etiology with severe aortic stenosis possibly contributing.  EKG obtained earlier today in NSR.  Will add on psyllium for diarrhea.

## 2019-10-14 ENCOUNTER — Inpatient Hospital Stay (HOSPITAL_COMMUNITY): Payer: Medicare Other

## 2019-10-14 DIAGNOSIS — R079 Chest pain, unspecified: Secondary | ICD-10-CM

## 2019-10-14 DIAGNOSIS — I350 Nonrheumatic aortic (valve) stenosis: Secondary | ICD-10-CM

## 2019-10-14 DIAGNOSIS — R072 Precordial pain: Secondary | ICD-10-CM | POA: Diagnosis not present

## 2019-10-14 LAB — CBC
HCT: 29.4 % — ABNORMAL LOW (ref 39.0–52.0)
Hemoglobin: 8.8 g/dL — ABNORMAL LOW (ref 13.0–17.0)
MCH: 29.4 pg (ref 26.0–34.0)
MCHC: 29.9 g/dL — ABNORMAL LOW (ref 30.0–36.0)
MCV: 98.3 fL (ref 80.0–100.0)
Platelets: 196 10*3/uL (ref 150–400)
RBC: 2.99 MIL/uL — ABNORMAL LOW (ref 4.22–5.81)
RDW: 17.6 % — ABNORMAL HIGH (ref 11.5–15.5)
WBC: 5.4 10*3/uL (ref 4.0–10.5)
nRBC: 0 % (ref 0.0–0.2)

## 2019-10-14 LAB — COMPREHENSIVE METABOLIC PANEL
ALT: 6 U/L (ref 0–44)
AST: 10 U/L — ABNORMAL LOW (ref 15–41)
Albumin: 2.9 g/dL — ABNORMAL LOW (ref 3.5–5.0)
Alkaline Phosphatase: 69 U/L (ref 38–126)
Anion gap: 14 (ref 5–15)
BUN: 51 mg/dL — ABNORMAL HIGH (ref 6–20)
CO2: 25 mmol/L (ref 22–32)
Calcium: 9.5 mg/dL (ref 8.9–10.3)
Chloride: 97 mmol/L — ABNORMAL LOW (ref 98–111)
Creatinine, Ser: 7.72 mg/dL — ABNORMAL HIGH (ref 0.61–1.24)
GFR, Estimated: 7 mL/min — ABNORMAL LOW (ref >60)
Glucose, Bld: 91 mg/dL (ref 70–99)
Potassium: 5 mmol/L (ref 3.5–5.1)
Sodium: 136 mmol/L (ref 135–145)
Total Bilirubin: 0.3 mg/dL (ref 0.3–1.2)
Total Protein: 7.6 g/dL (ref 6.5–8.1)

## 2019-10-14 LAB — TROPONIN I (HIGH SENSITIVITY)
Troponin I (High Sensitivity): 21 ng/L — ABNORMAL HIGH (ref <18)
Troponin I (High Sensitivity): 19 ng/L — ABNORMAL HIGH (ref <18)

## 2019-10-14 MED ORDER — PROMETHAZINE HCL 25 MG PO TABS
25.0000 mg | ORAL_TABLET | Freq: Two times a day (BID) | ORAL | Status: DC | PRN
  Administered 2019-10-14: 25 mg via ORAL
  Filled 2019-10-14: qty 1

## 2019-10-14 NOTE — Progress Notes (Addendum)
Physical Therapy Treatment Patient Details Name: [name] MRN: [id number] DOB: 07/08/1965 Today's Date: 10/14/2019    History of Present Illness [name] is a 54 y.o. male presenting with inability to obtain access at his right femoral dialysis catheter while at HD, now with acute knee pain and swelling with fever concerning for septic arthritis. PMH is significant for ESRD on HD (MWF), PAD, kidney stones, spina bifida, hx of nephrostomy tube, HTN, GERD, anxiety 10/1 Admitted, HD attempted (terminated early due to pain); 10/2Inferior venacavogram(right), I&D of left thigh with graft excision, HD, 10/4 Joint aspiration left knee; 10/5 Arthroscopic left knee irrigation and drainage with placement of antibiotic beads.     PT Comments    Patient received in bed, anxious today but cooperative with therapy. Able to progress gait distance further but shaky and tremulous on feet with RW, although able to maintain balance with RW/min guard dynamically with heavy reliance on BUE support. Did have one time LOB when first standing requiring that we sit back down on the bed to recover. Had frank discussion regarding safety in that if his plan is currently to go home rather than rehab, PT recommends sleeping in recliner positioned for minimal pressure on L flank wound and performing only transfers to/from [facility] until HHPT is able to build him up enough to safely ambulate independently- patient agreeable to this plan. Left in bed positioned to comfort with all needs met, bed alarm active. Will continue to follow acutely.     Follow Up Recommendations  Home health PT (would benefit from SNF if agreeable)     Equipment Recommendations  Rolling walker with 5" wheels;Other (comment) (bariatric)    Recommendations for Other Services       Precautions / Restrictions Precautions Precautions: Fall;Other (comment) Precaution Comments: tunneled femoral cath (cleared for OOB/gait by nephrology  PA) Restrictions Weight Bearing Restrictions: No Other Position/Activity Restrictions: WBAT    Mobility  Bed Mobility Overal bed mobility: Needs Assistance Bed Mobility: Supine to Sit;Sit to Supine     Supine to sit: Supervision Sit to supine: Supervision   General bed mobility comments: No physical assist required, increased time/effort  Transfers Overall transfer level: Needs assistance Equipment used: Rolling walker (2 wheeled) Transfers: Sit to/from Stand Sit to Stand: Min guard         General transfer comment: Min guard to come to upright standing position, cues for hand placement and needed increased time/increased effort  Ambulation/Gait Ambulation/Gait assistance: Min guard Gait Distance (Feet): 10 Feet Assistive device: Rolling walker (2 wheeled) Gait Pattern/deviations: Step-to pattern;Antalgic;Trunk flexed;Decreased stance time - right;Decreased weight shift to right Gait velocity: decreased   General Gait Details: still with considerable antalgic pattern but able to tolerate progression of gait distance, cues for safe use of RW; also toleraetd lateral side steps up side of bed prior to return to supine   Stairs             Wheelchair Mobility    Modified Rankin (Stroke Patients Only)       Balance Overall balance assessment: Needs assistance Sitting-balance support: Bilateral upper extremity supported;Feet supported Sitting balance-Leahy Scale: Fair Sitting balance - Comments: dependent on BUE but no external support needed   Standing balance support: Bilateral upper extremity supported Standing balance-Leahy Scale: Poor Standing balance comment: heavy reliance on external support                            Cognition Arousal/Alertness: Awake/alert  Behavior During Therapy: WFL for tasks assessed/performed Overall Cognitive Status: Within Functional Limits for tasks assessed                                         Exercises      General Comments        Pertinent Vitals/Pain Pain Assessment: Faces Faces Pain Scale: Hurts little more Pain Location: L knee Pain Descriptors / Indicators: Grimacing;Guarding;Sharp Pain Intervention(s): Limited activity within patient's tolerance;Monitored during session;Repositioned    Home Living                      Prior Function            PT Goals (current goals can now be found in the care plan section) Acute Rehab PT Goals Patient Stated Goal: to get knee to have less pain, be able to walk again PT Goal Formulation: With patient Time For Goal Achievement: 10/23/19 Potential to Achieve Goals: Good Progress towards PT goals: Progressing toward goals    Frequency    Min 3X/week      PT Plan Current plan remains appropriate    Co-evaluation              AM-PAC PT "6 Clicks" Mobility   Outcome Measure  Help needed turning from your back to your side while in a flat bed without using bedrails?: None Help needed moving from lying on your back to sitting on the side of a flat bed without using bedrails?: None Help needed moving to and from a bed to a chair (including a wheelchair)?: A Little Help needed standing up from a chair using your arms (e.g., wheelchair or bedside chair)?: A Little Help needed to walk in hospital room?: A Little Help needed climbing 3-5 steps with a railing? : A Lot 6 Click Score: 19    End of Session Equipment Utilized During Treatment: Gait belt Activity Tolerance: Patient limited by pain Patient left: in bed;with call bell/phone within reach;with bed alarm set Nurse Communication: Mobility status PT Visit Diagnosis: Unsteadiness on feet (R26.81);Difficulty in walking, not elsewhere classified (R26.2);Pain;Muscle weakness (generalized) (M62.81) Pain - Right/Left: Left Pain - part of body: Knee     Time: 0952-1030 PT Time Calculation (min) (ACUTE ONLY): 38 min  Charges:  $Gait Training:  8-22 mins $Therapeutic Activity: 8-22 mins $Self Care/Home Management: 8-22                     Windell Norfolk, DPT, PN1   Supplemental Physical Therapist [place]    Pager [phone] Acute Rehab Office [phone]

## 2019-10-14 NOTE — Care Management Important Message (Signed)
Important Message  Patient Details  Name: [name] MRN: [id number] Date of Birth: Oct 31, 1965   Medicare Important Message Given:  Yes - Important Message mailed due to current National Emergency  Verbal consent obtained due to current National Emergency  Relationship to patient: Self Contact Name: Jahan Friedlander Call Date: 10/14/19  Time: 1053 Phone: [phone] Outcome: Spoke with contact Important Message mailed to: Other (must enter comment) (patient declined additional copy of IM)    [name] [name] 10/14/2019, 10:53 AM

## 2019-10-14 NOTE — Progress Notes (Signed)
[name] Progress Note   Subjective:    Dialysis yesterday tolerated 3L UF.  Seen in room. Feeling a little better today.  Plans for discharge. He's anxious about dealing with wound VAC at home.   Objective Vitals:   10/13/19 1627 10/14/19 0546 10/14/19 0700 10/14/19 0849  BP: 120/62 (!) 119/24 (!) 105/55 (!) 113/46  Pulse: 77 66  69  Resp: 17 16  18   Temp: 97.9 F (36.6 C) 98.7 F (37.1 C)  98.1 F (36.7 C)  TempSrc:  Oral    SpO2: 100% 100%  99%  Weight:      Height:       Physical Exam General:chronically ill appearing obese, male in NAD Heart:RRR, +5/8 systolic murmur Lungs: Clear, bilaterally  Abdomen: soft, mild diffuse tenderness  Extremities:trace LE edema, L knee wrapped, L thigh wound dressed Dialysis Access: R fem [facility]   Filed Weights   10/12/19 2044 10/13/19 0711 10/13/19 1155  Weight: 118.8 kg 117.1 kg 114.4 kg    Intake/Output Summary (Last 24 hours) at 10/14/2019 1038 Last data filed at 10/14/2019 0825 Gross per 24 hour  Intake 960 ml  Output 3000 ml  Net -2040 ml    Additional Objective Labs: Basic Metabolic Panel: Recent Labs  Lab 10/08/19 0748 10/08/19 0748 10/09/19 0156 10/09/19 0156 10/10/19 0352 10/11/19 1209 10/12/19 0500 10/13/19 0143 10/14/19 0306  NA 132*   < > 135   < > 135   < > 137 137 136  K 6.2*   < > 4.6   < > 5.0   < > 4.9 5.5* 5.0  CL 95*   < > 94*   < > 98   < > 100 99 97*  CO2 22   < > 25   < > 25   < > 24 24 25   GLUCOSE 103*   < > 107*   < > 108*   < > 101* 83 91  BUN 64*   < > 45*   < > 69*   < > 59* 75* 51*  CREATININE 8.78*   < > 6.72*   < > 8.74*   < > 8.78* 10.70* 7.72*  CALCIUM 9.0   < > 9.5   < > 9.3   < > 9.1 9.3 9.5  PHOS 8.6*  --  5.2*  --  5.6*  --   --   --   --    < > = values in this interval not displayed.   Liver Function Tests: Recent Labs  Lab 10/12/19 0500 10/13/19 0143 10/14/19 0306  AST 8* 7* 10*  ALT 5 6 6   ALKPHOS 59 68 69  BILITOT 0.4 0.8 0.3  PROT 7.2 7.1 7.6   ALBUMIN 2.8* 2.7* 2.9*   CBC: Recent Labs  Lab 10/10/19 0352 10/10/19 0352 10/11/19 2230 10/11/19 2230 10/12/19 0500 10/13/19 0143 10/14/19 0306  WBC 5.5   < > 5.0   < > 5.5 5.9 5.4  NEUTROABS  --   --  3.5  --   --   --   --   HGB 7.8*   < > 9.2*   < > 8.7* 8.5* 8.8*  HCT 27.1*   < > 31.0*   < > 29.3* 28.1* 29.4*  MCV 98.5  --  98.4  --  99.0 98.3 98.3  PLT 164   < > 181   < > 205 193 196   < > = values in this interval  not displayed.   Blood Culture    Component Value Date/Time   SDES FLUID 10/07/2019 1517   SPECREQUEST LEFT KNEE JOINT SPEC A 10/07/2019 1517   CULT  10/07/2019 1517    No growth aerobically or anaerobically. Performed at [facility], [place] [address], [place], [place] [address]    REPTSTATUS 10/12/2019 FINAL 10/07/2019 1517    Studies/Results: No results found.  Medications: . cefTAZidime (FORTAZ)  IV 2 g (10/13/19 1322)  . vancomycin 1,000 mg (10/13/19 1055)   . acetaminophen  1,000 mg Oral Q8H  . aspirin EC  325 mg Oral Daily  . calcium acetate  2,001 mg Oral With snacks  . calcium acetate  2,668 mg Oral TID with meals  . Chlorhexidine Gluconate Cloth  6 each Topical Q0600  . clopidogrel  75 mg Oral Daily  . darbepoetin (ARANESP) injection - DIALYSIS  200 mcg Intravenous Q Mon-HD  . doxercalciferol  3 mcg Intravenous Q M,W,F-HD  . famotidine  10 mg Oral Q M,W,F-2000  . heparin  5,000 Units Subcutaneous Q8H  . levothyroxine  137 mcg Oral QAC breakfast  . lidocaine  2 patch Transdermal Daily  . psyllium  1 packet Oral Daily  . sodium zirconium cyclosilicate  10 g Oral QODAY    [facility] Optiflux 200NRe, Time: 4hr 66min, BFR 350, DFR 800, EDW 114.5kg, 2K/2.,25Ca, TDC heparin 3700 unit bolus Mircera 237mcg IV q 2 weeks- last dose 9/27 Venofer 100mg  q HD -last dose due today Phoslo 4 caps TO TID with meals and 3 with snacks Hectorol 3 mcg IV q HD Kayexalate 15g PO on non-dialysis  days  Assessment/Plan: 1. ESRD:HD MWF. S/p [facility] manipulation by Dr. Trula Slade 10/2 as well as L thigh debridement with old AVG removal.TDC running better since intervention, giving cath flo post each HD. Access options limited. Next HD Wed 10/13 at outpatient center if discharged today.  2. Hyperkalemia:Recurrent issue -pt takes kayexalate on non-dialysis today - changing to lokelma d/t issues with diarrhea. 3. Fevers. Blood cultures neg to date. Possible sources of infection include: dialysis catheter, left thigh wound, septic knee-- wound cultures neg to date. IV antibiotics per primary team.  Per ID continue Vanc/Fortaz TIW with HD until 11/2  4. L thigh wound/infected old AVG: S/p excision/debridement 10/2 -wet to dry dressings. Wound vac d/c due to dysfxn, may try again. Wont' be able to do dressings at home. Restart wound VAC at discharge.  5. L knee pain/effusion - s/p joint aspiration 10/4. Concern for septic arthrits 25K WBCs insynovialfluid. To OR for I &D 10/5 --Culturesneg to date. 6. Hypertension/volume:BPlow/stable.Not getting to EDW, continue to UF as tolerated. CXR shows volume overload, lungs sound clear.  Reinforced fluid restrictions.  Now at outpatient dry weight.  7. Anemia:Hgb 8.5 . Aranesp 200 dosed 79/39.  8. Metabolic bone disease:Ca/Phos ok. Continue hectorol and phoslo. 9. Nutrition:Renal diet with 1.2L fluid restriction recommended. Alb 2.8 10. L flank wound:D/t prior PCN tube - some erythema, no drainage - pt reports this is improved, follow closely. 11. Diarrhea -D/c colace  12. Dispo -For discharge   Lynnda Child PA-C [facility] 10/14/2019,10:38 AM

## 2019-10-14 NOTE — Progress Notes (Signed)
Paged earlier today from primary service stating patient is amenable to having wound VAC dressing placed.  Will place order for wound care team to place wound VAC to left thigh wound and place consult to [facility] team to investigate resources for home wound VAC pump and VAC dressing changes.

## 2019-10-14 NOTE — Consult Note (Signed)
WOC Nurse Consult Note: Reason for Consult: Placement of NPWT dressing to left medial thigh wound (full thickness) Wound type: surgical  Pressure Injury POA: N/A Measurement:4.5cm x 7cm x 2cm Wound bed: red, moist, irregular Drainage (amount, consistency, odor) serous to serosanguinous  Periwound:intact Dressing procedure/placement/frequency: Gauze dressing removed with the assistance of the patient's Bedside RN, Gwenlyn Perking.  Wound cleansed with NS, and gently patted dry. One (1) piece of black Granufoam used to obliterate dead space, this is covered with drape. The dressing is attached to NPWT via the T.R.A.C. pad and continuous negative pressure is initiated at 142mmHg negative pressure. Patient tolerated procedure well.  The dressing is labeled with today's measurements. The bedside Nursing staff will change subsequent dressings.  There are ordered for Tuesday/Thursday/Saturday this week and next week will resume M/W/F schedule. This schedule is historically easier for downstream providers I.e., HHR[facility].  I have communicated this treatment, response and change in orders to Dr. Owens Shark via phone.  [facility] nursing team will not follow, but will remain available to this patient, the nursing and medical teams.  Please re-consult if needed. Thanks, Maudie Flakes, MSN, RN, [place], Arther Abbott  Pager# [phone]

## 2019-10-14 NOTE — Consult Note (Addendum)
Cardiology Consultation:   Patient ID: [name] MRN: [id number]; DOB: [birth year]  Admit date: 10/03/2019 Date of Consult: 10/14/2019  Primary Care Provider: [name], MD [facility] Cardiologist: Lauree Chandler, MD  [facility] Electrophysiologist:  None    Patient Profile:   [name] is a 54 y.o. male with a hx of bacterial endocarditis, HTN, ESRD on HD, GERD, seizure disorder, spina bifida with myelomeningocele, kidney stones, s/p stenting of left iliac artery in 2018 followed closely by Dr. Donzetta Matters in [facility],  and hypothyroidism who is being seen today for the evaluation of chest pain at the request of Dr Owens Shark.  History of Present Illness:   [name] is followed by Dr. Angelena Form for the above cardiac issues. He has been ESRD since 1997. He was first seen in [place] 2019 for pre-op evaluation prior to vascular surgery. He had an echo 02/2017 in an outside hospital showing LVEF 69%, G2DD, moderate AS with mean gradient 57mmHG, mod MS with mean gradient 74mmHg. Pt was cleared for his surgery and did well. He was admitted December 2019 with fevers found to have MSSA bacteremia. TEE 12/14/2017 showed small mobile vegetation on the annulus of the mitral valve, mild MS and mild MR. He was discharged on IV abx. HE was last seen 05/03/2018 for telemedicine visit and was symptomatically stable.  The patient was admitted to [facility] 10/1 for inability to obtain access at his right femoral dialysis catheter while at HD. 10/2 he had an inferior venacavogram and he is s/p I&D of left thigh with graft excision by Dr. Trula Slade and now has wound vac, underwent HD that day. 10/4 patient underwent joint aspiration of left knee with concern for septic arthritis (cultures negative). 10/5 he had arthoscopic left knee irrigation and drainage with placement of antibiotic beads. Has had fevers with negative blood cultures and was placed on antibiotics. Stay also complicated by anemia,  hypotension of HD days, diarrhea, hyperkalemia. This mornig patient was with PT and reported exertional chest pain and cardiology was consulted.   He says he walked to the door with his walker and when he sat down he felt substernal chest pain. It was a tightness, 4/10. It went down into his right arm and up into his neck. He denies associated symptoms. Pain lasted 15 minutes before self resolving. EKG ordered but not obtained. HS troponin 21. He said since he's been here he has positional chest pain when he lays on the left side. Also has pleuritic chest pain.     Past Medical History:  Diagnosis Date  . Anemia   . Anxiety   . Constipation   . End stage renal failure on dialysis [facility])    [place]; MWF, on HD since 1997, exhausted upper ext access, and possibly LE access; cath dependent in R groin as of 06/2018  . GERD (gastroesophageal reflux disease)   . Grand mal seizure ([place]) X 4   last 1998 (02/29/2016)  . History of blood transfusion 2000s   "I was having blood loss; never found out from where"  . History of kidney stones   . Hypertension    hx of - has not taken bp meds in over 2 years  since dialysis  . Hypothyroidism   . Insomnia   . Migraine    "due to my BP in my late 1990s; none since" (02/29/2016)  . Nonhealing surgical wound    of the left arteriovenous graft  . Severe aortic stenosis    no problems  with per pt. - nephrologist and Dr. Coletta Memos  . Spina bifida [facility])     Past Surgical History:  Procedure Laterality Date  . AMPUTATION Left 12/11/2017   Procedure: Left Great Toe Amputation;  Surgeon: [name], MD;  Location: [place];  Service: Orthopedics;  Laterality: Left;  . APPENDECTOMY    . APPLICATION OF WOUND VAC Left 10/04/2019   Procedure: APPLICATION OF WOUND VAC;  Surgeon: Serafina Mitchell, MD;  Location: [place];  Service: Vascular;  Laterality: Left;  . ARTERIOVENOUS GRAFT PLACEMENT Left 10/16/2012   left femoral goretex graft         Dr Donnetta Hutching  . AV  FISTULA PLACEMENT Left 06/27/2012   Procedure: EXPLORATORY LEFT THY-GRAFT PSEUDO-ANEURYSM;  Surgeon: Conrad De Kalb, MD;  Location: [place];  Service: Vascular;  Laterality: Left;  Revision of left Arteriovenus gortex graft in thigh.  Bertram Savin REMOVAL Left 10/04/2019   Procedure: REMOVAL OF ARTERIOVENOUS GORETEX GRAFT ([place]);  Surgeon: Serafina Mitchell, MD;  Location: [facility] OR;  Service: Vascular;  Laterality: Left;  . COLONOSCOPY    . DRESSING CHANGE UNDER ANESTHESIA Left 10/07/2019   Procedure: DRESSING CHANGE UNDER ANESTHESIA;  Surgeon: Meredith Pel, MD;  Location: [place];  Service: Orthopedics;  Laterality: Left;  . FLEXIBLE SIGMOIDOSCOPY N/A 12/03/2018   Procedure: FLEXIBLE SIGMOIDOSCOPY;  Surgeon: Yetta Flock, MD;  Location: [place];  Service: Gastroenterology;  Laterality: N/A;  . I & D EXTREMITY Left 02/29/2016   Procedure: DEBRIDEMENT LEFT THIGH WOUND;  Surgeon: Waynetta Sandy, MD;  Location: [place];  Service: Vascular;  Laterality: Left;  . I & D EXTREMITY Left 10/04/2019   Procedure: IRRIGATION AND DEBRIDEMENT LEFT THIGH;  Surgeon: Serafina Mitchell, MD;  Location: [place];  Service: Vascular;  Laterality: Left;  . ILEOSTOMY  1970s  . INCISION AND DRAINAGE Left 10/16/2012   Procedure: INCISION AND Debridement left thigh graft;  Surgeon: [place] Posner, MD;  Location: [place];  Service: Vascular;  Laterality: Left;  . INSERTION OF DIALYSIS CATHETER    . INSERTION OF DIALYSIS CATHETER  01/14/2016   Procedure: INSERTION OF DIALYSIS CATHETER;  Surgeon: Waynetta Sandy, MD;  Location: [place];  Service: Vascular;;  . INSERTION OF DIALYSIS CATHETER Right 08/07/2017   Procedure: INSERTION OF DIALYSIS CATHETER;  Surgeon: Waynetta Sandy, MD;  Location: [place];  Service: Vascular;  Laterality: Right;  . INSERTION OF DIALYSIS CATHETER Right 12/21/2017   Procedure: RIGHT FEMORAL DIALYSIS CATHETER EXCHANGE;  Surgeon: Marty Heck, MD;  Location: [place];  Service:  Vascular;  Laterality: Right;  . INSERTION OF ILIAC STENT Left 01/14/2016   Procedure: INSERTION OF ILIAC STENT;  Surgeon: Waynetta Sandy, MD;  Location: [place];  Service: Vascular;  Laterality: Left;  . INTRAOPERATIVE ARTERIOGRAM Left 01/14/2016   Procedure: INTRA OPERATIVE ARTERIOGRAM;  Surgeon: Waynetta Sandy, MD;  Location: [place];  Service: Vascular;  Laterality: Left;  . IR NEPHROSTOMY EXCHANGE LEFT  03/21/2019  . IR NEPHROSTOMY PLACEMENT LEFT  11/28/2018  . KNEE ARTHROSCOPY Left 10/07/2019   Procedure: ARTHROSCOPY KNEE w/ STIMULATING  BEAD PLACMENT;  Surgeon: Meredith Pel, MD;  Location: [place];  Service: Orthopedics;  Laterality: Left;  . LITHOTRIPSY  x 3   "& a laser treatment" (02/29/2016)  . NEPHROLITHOTOMY Left 03/26/2019   Procedure: NEPHROLITHOTOMY PERCUTANEOUS;  Surgeon: Alexis Frock, MD;  Location: [facility];  Service: Urology;  Laterality: Left;  2 HRS  . NEPHROSTOMY Bilateral 1968  .  PATELLAR TENDON REPAIR Right 1990s   "big incision"  . PERIPHERAL VASCULAR CATHETERIZATION  01/14/2016   Procedure: A/V SHUNTOGRAM;  Surgeon: Waynetta Sandy, MD;  Location: [place];  Service: Vascular;;  . REVISION OF ARTERIOVENOUS GORETEX GRAFT Left 10/16/2012   Procedure: REVISION OF LEFT FEMORAL LOOP ARTERIOVENOUS GORETEX GRAFT;  Surgeon: [place] Posner, MD;  Location: [place];  Service: Vascular;  Laterality: Left;  . REVISION OF ARTERIOVENOUS GORETEX GRAFT Left 02/11/2015   Procedure: EXCISION OF SMALL SEGMENT OF EXPOSED LEFT THIGH NON FUNCTIONING  ARTERIOVENOUS GORETEX GRAFT;  Surgeon: Mal Misty, MD;  Location: [place];  Service: Vascular;  Laterality: Left;  . REVISION OF ARTERIOVENOUS GORETEX GRAFT Left 01/14/2016   Procedure: REVISION OF ARTERIOVENOUS GORETEX GRAFT;  Surgeon: Waynetta Sandy, MD;  Location: [place];  Service: Vascular;  Laterality: Left;  . REVISION OF ARTERIOVENOUS GORETEX GRAFT Left 02/29/2016   thigh/pt report  . REVISION OF  ARTERIOVENOUS GORETEX GRAFT Left 02/29/2016   Procedure: POSSIBLE REVISION OF LEFT THIGH ARTERIOVENOUS GORETEX GRAFT;  Surgeon: Waynetta Sandy, MD;  Location: [place];  Service: Vascular;  Laterality: Left;  . TEE WITHOUT CARDIOVERSION N/A 12/14/2017   Procedure: TRANSESOPHAGEAL ECHOCARDIOGRAM (TEE);  Surgeon: Acie Fredrickson, Wonda Cheng, MD;  Location: [place];  Service: Cardiovascular;  Laterality: N/A;  . THROMBECTOMY AND REVISION OF ARTERIOVENTOUS (AV) GORETEX  GRAFT Left 06/12/2018   Procedure: Removal of infected left thigh graft;  Surgeon: [place] Posner, MD;  Location: [place];  Service: Vascular;  Laterality: Left;  . THROMBECTOMY W/ EMBOLECTOMY Left 01/14/2016   Procedure: THROMBECTOMY ARTERIOVENOUS GORE-TEX Left thigh GRAFT;  Surgeon: Waynetta Sandy, MD;  Location: [place];  Service: Vascular;  Laterality: Left;  . TONGUE SURGERY  ~ 1990   tongue-tie release   . ULTRASOUND GUIDANCE FOR VASCULAR ACCESS Right 08/07/2017   Procedure: ULTRASOUND GUIDANCE FOR VASCULAR CANNULATION RIGHT FEMORAL VEIN AND LEFT AV FEMORAL GRAFT;  Surgeon: Waynetta Sandy, MD;  Location: [place];  Service: Vascular;  Laterality: Right;  . UPPER EXTREMITY VENOGRAPHY Bilateral 08/09/2017   Procedure: UPPER EXTREMITY VENOGRAPHY;  Surgeon: Serafina Mitchell, MD;  Location: [place] [facility];  Service: Cardiovascular;  Laterality: Bilateral;  . VENOGRAM N/A 09/20/2017   Procedure: RIGHT COMMON FEMORAL ARTERY EXPLORATION.  CANNULATION RIGHT COMMON FEMORAL VEIN. VENOGRAM CENTRAL ULTRA SOUND GUIDED RIGHT FEMORAL VEIN TIMES TWO.;  Surgeon: Waynetta Sandy, MD;  Location: [place];  Service: Vascular;  Laterality: N/A;  . VENOGRAM Right 10/04/2019   Procedure: VENOGRAM;  Surgeon: Serafina Mitchell, MD;  Location: [place];  Service: Vascular;  Laterality: Right;  . WOUND DEBRIDEMENT Left 02/29/2016   thigh     Home Medications:  Prior to Admission medications   Medication Sig Start Date End Date Taking?  Authorizing Provider  aspirin 325 MG tablet Take 325 mg by mouth daily.   Yes [provider]  calcium acetate (PHOSLO) 667 MG capsule Take 3-4 capsules (2,001-2,668 mg total) by mouth See admin instructions. Take 2708 mg by mouth 3 times daily with meals, take 2001 mg by mouth with snacks Patient taking differently: Take 2,001-2,668 mg by mouth See admin instructions. Take 2,668 mg by mouth three times a day with meals and 2,001 mg with optional snacks 01/14/18  Yes Hennie Duos, MD  clopidogrel (PLAVIX) 75 MG tablet Take 75 mg by mouth daily.   Yes [provider]  famotidine (PEPCID) 20 MG tablet Take 1 tablet (20 mg total) by mouth daily.  03/25/19  Yes Gifford Shave, MD  fluticasone [facility]) 50 MCG/ACT nasal spray Place 2 sprays into both nostrils as needed for allergies or rhinitis (congestion). Patient taking differently: Place 2 sprays into both nostrils as needed for allergies or rhinitis (or congestion).  01/14/18  Yes Hennie Duos, MD  furosemide (LASIX) 80 MG tablet Take 80 mg by mouth 2 (two) times daily as needed (fluid).   Yes [provider]  levothyroxine (SYNTHROID) 137 MCG tablet Take 137 mcg by mouth daily before breakfast.   Yes [provider]  LORazepam (ATIVAN) 1 MG tablet Take 1 mg by mouth daily as needed (anxiety).    Yes [provider]  Nutritional Supplements (NEPRO) LIQD Take 237 mLs by mouth daily. Mixed Berry   Yes [provider]  oxyCODONE-acetaminophen (PERCOCET) 10-325 MG tablet Take 1 tablet by mouth every 6 (six) hours as needed for pain. 06/14/18  Yes Dagoberto Ligas, PA-C  promethazine (PHENERGAN) 25 MG tablet Take 1 tablet (25 mg total) by mouth 2 (two) times daily as needed for nausea or vomiting. Patient taking differently: Take 25 mg by mouth every 8 (eight) hours as needed for nausea or vomiting.  01/14/18  Yes Hennie Duos, MD  sodium polystyrene (KAYEXALATE) powder Take 15 g by mouth See  admin instructions. Take 15 g (mixed in water) by mouth on Saturdays and Sundays 01/14/18  Yes Hennie Duos, MD  traZODone (DESYREL) 100 MG tablet Take 1 tablet (100 mg total) by mouth at bedtime. 01/14/18  Yes Hennie Duos, MD  zolpidem (AMBIEN) 10 MG tablet Take 1 tablet (10 mg total) by mouth at bedtime. 01/14/18  Yes Hennie Duos, MD  levothyroxine (SYNTHROID, LEVOTHROID) 125 MCG tablet Take 1 tablet (125 mcg total) by mouth daily before breakfast. Patient not taking: Reported on 10/03/2019 01/14/18   Hennie Duos, MD    Inpatient Medications: Scheduled Meds: . acetaminophen  1,000 mg Oral Q8H  . aspirin EC  325 mg Oral Daily  . calcium acetate  2,001 mg Oral With snacks  . calcium acetate  2,668 mg Oral TID with meals  . Chlorhexidine Gluconate Cloth  6 each Topical Q0600  . clopidogrel  75 mg Oral Daily  . darbepoetin (ARANESP) injection - DIALYSIS  200 mcg Intravenous Q Mon-HD  . doxercalciferol  3 mcg Intravenous Q M,W,F-HD  . famotidine  10 mg Oral Q M,W,F-2000  . heparin  5,000 Units Subcutaneous Q8H  . levothyroxine  137 mcg Oral QAC breakfast  . lidocaine  2 patch Transdermal Daily  . psyllium  1 packet Oral Daily  . sodium zirconium cyclosilicate  10 g Oral QODAY   Continuous Infusions: . cefTAZidime (FORTAZ)  IV 2 g (10/13/19 1322)  . vancomycin 1,000 mg (10/13/19 1055)   PRN Meds: loperamide, Muscle Rub, [DISCONTINUED] oxyCODONE-acetaminophen **AND** oxyCODONE, promethazine, traZODone, zolpidem  Allergies:    Allergies  Allergen Reactions  . Hydrocodone Other (See Comments)    Caused involuntary movement and twitching. CANNOT TAKE DUE TO MUSCLE SPASMS AND MUSCLE TREMORS  . Colace [Docusate] Diarrhea  . Furadantin [Nitrofurantoin] Other (See Comments)    UNSPECIFIED REACTION   . Mandelamine [Methenamine] Other (See Comments)    UNSPECIFIED REACTION   . Noroxin [Norfloxacin] Other (See Comments)    UNSPECIFIED REACTION   . Sulfa Antibiotics Other  (See Comments) and Cough    Childhood reaction - pt could not confirm that it was a cough  . Sulfur Cough    Childhood  reaction - pt could not confirm that it was a cough    Social History:   Social History   Socioeconomic History  . Marital status: Single    Spouse name: Not on file  . Number of children: 0  . Years of education: Not on file  . Highest education level: Not on file  Occupational History  . Occupation: Disabled  Tobacco Use  . Smoking status: Never Smoker  . Smokeless tobacco: Never Used  Vaping Use  . Vaping Use: Never used  Substance and Sexual Activity  . Alcohol use: Not Currently    Alcohol/week: 0.0 standard drinks    Comment: 02/29/2016 "nothing since  the mid 1990s  . Drug use: Not Currently    Types: Marijuana    Comment: 02/29/2016 "nothing since ~ 1995"  . Sexual activity: Yes  Other Topics Concern  . Not on file  Social History Narrative   Lives with sister    Social Determinants of Health   Financial Resource Strain:   . Difficulty of Paying Living Expenses: Not on file  Food Insecurity:   . Worried About Charity fundraiser in the Last Year: Not on file  . Ran Out of Food in the Last Year: Not on file  Transportation Needs:   . Lack of Transportation (Medical): Not on file  . Lack of Transportation (Non-Medical): Not on file  Physical Activity:   . Days of Exercise per Week: Not on file  . Minutes of Exercise per Session: Not on file  Stress:   . Feeling of Stress : Not on file  Social Connections:   . Frequency of Communication with Friends and Family: Not on file  . Frequency of Social Gatherings with Friends and Family: Not on file  . Attends Religious Services: Not on file  . Active Member of Clubs or Organizations: Not on file  . Attends Archivist Meetings: Not on file  . Marital Status: Not on file  Intimate Partner Violence:   . Fear of Current or Ex-Partner: Not on file  . Emotionally Abused: Not on file  .  Physically Abused: Not on file  . Sexually Abused: Not on file    Family History:   Family History  Problem Relation Age of Onset  . Diabetes Mother   . Hypertension Mother   . Heart disease Mother        before age 66  . Diabetes Father   . Heart attack Father        X's 3  . Diabetes Sister   . Bipolar disorder Sister      ROS:  Please see the history of present illness.  All other ROS reviewed and negative.     Physical Exam/Data:   Vitals:   10/13/19 1627 10/14/19 0546 10/14/19 0700 10/14/19 0849  BP: 120/62 (!) 119/24 (!) 105/55 (!) 113/46  Pulse: 77 66  69  Resp: 17 16  18   Temp: 97.9 F (36.6 C) 98.7 F (37.1 C)  98.1 F (36.7 C)  TempSrc:  Oral    SpO2: 100% 100%  99%  Weight:      Height:        Intake/Output Summary (Last 24 hours) at 10/14/2019 1435 Last data filed at 10/14/2019 1408 Gross per 24 hour  Intake 1080 ml  Output 0 ml  Net 1080 ml   Last 3 Weights 10/13/2019 10/13/2019 10/12/2019  Weight (lbs) 252 lb 3.3 oz 258 lb 2.5 oz 261 lb  14.5 oz  Weight (kg) 114.4 kg 117.1 kg 118.8 kg     Body mass index is 35.18 kg/m.  General:  Well nourished, well developed, in no acute distress HEENT: normal Lymph: no adenopathy Neck: no JVD Endocrine:  No thryomegaly Vascular: No carotid bruits; FA pulses 2+ bilaterally without bruits  Cardiac:  normal S1, S2; RRR; + murmur  Lungs:  clear to auscultation bilaterally, no wheezing, rhonchi or rales  Abd: soft, nontender, no hepatomegaly  Ext: no edema Musculoskeletal:  No deformities, BUE and BLE strength normal and equal Skin: warm and dry  Neuro:  CNs 2-12 intact, no focal abnormalities noted Psych:  Normal affect   EKG:  The EKG was personally reviewed and demonstrates:  NSR, 72bpm, PR 50ms, nonspecific ST changes, PAC Telemetry:  Telemetry was personally reviewed and demonstrates:  N/A  Relevant CV Studies:  Echo 10/05/19 1. Left ventricular ejection fraction, by estimation, is 65 to 70%. The   left ventricle has normal function. The left ventricle has no regional  wall motion abnormalities. There is mild left ventricular hypertrophy.  Left ventricular diastolic parameters  are consistent with Grade II diastolic dysfunction (pseudonormalization).  2. Right ventricular systolic function is normal. The right ventricular  size is normal. Tricuspid regurgitation signal is inadequate for assessing  PA pressure.  3. Left atrial size was moderately dilated.  4. The mitral valve is abnormal. Trivial mitral valve regurgitation.  Moderate mitral annular calcification.  5. The aortic valve is functionally bicuspid. There is moderate  calcification of the aortic valve and evidence of severe stenosis. Aortic  valve mean gradient measures 37.2 mmHg. Aortic valve Vmax measures 3.88  m/s. Dimentionless index 0.25  6. The inferior vena cava is normal in size with greater than 50%  respiratory variability, suggesting right atrial pressure of 3 mmHg.   Laboratory Data:  High Sensitivity Troponin:   Recent Labs  Lab 10/14/19 1208  TROPONINIHS 21*     Chemistry Recent Labs  Lab 10/12/19 0500 10/13/19 0143 10/14/19 0306  NA 137 137 136  K 4.9 5.5* 5.0  CL 100 99 97*  CO2 24 24 25   GLUCOSE 101* 83 91  BUN 59* 75* 51*  CREATININE 8.78* 10.70* 7.72*  CALCIUM 9.1 9.3 9.5  GFRNONAA 6* 5* 7*  ANIONGAP 13 14 14     Recent Labs  Lab 10/12/19 0500 10/13/19 0143 10/14/19 0306  PROT 7.2 7.1 7.6  ALBUMIN 2.8* 2.7* 2.9*  AST 8* 7* 10*  ALT 5 6 6   ALKPHOS 59 68 69  BILITOT 0.4 0.8 0.3   Hematology Recent Labs  Lab 10/12/19 0500 10/13/19 0143 10/14/19 0306  WBC 5.5 5.9 5.4  RBC 2.96* 2.86* 2.99*  HGB 8.7* 8.5* 8.8*  HCT 29.3* 28.1* 29.4*  MCV 99.0 98.3 98.3  MCH 29.4 29.7 29.4  MCHC 29.7* 30.2 29.9*  RDW 17.8* 17.6* 17.6*  PLT 205 193 196   BNPNo results for input(s): BNP, PROBNP in the last 168 hours.  DDimer No results for input(s): DDIMER in the last 168  hours.   Radiology/Studies:  DG Chest 2 View  Result Date: 10/14/2019 CLINICAL DATA:  Shortness of breath. EXAM: CHEST - 2 VIEW COMPARISON:  Chest radiograph 10/11/2019, 10/06/2019 FINDINGS: Stable cardiomediastinal contours. Central venous congestion without overt edema. Low lung volumes. A few scattered linear opacities left base likely reflect atelectasis. No pneumothorax or large pleural effusion. Diffuse demineralization of the bones. No acute osseous finding. IMPRESSION: Low volume study. Central venous congestion without overt  edema. Minimal left basilar atelectasis. Electronically Signed   By: Audie Pinto M.D.   On: 10/14/2019 12:57   DG Chest 2 View  Result Date: 10/11/2019 CLINICAL DATA:  Chest pressure and shortness of breath EXAM: CHEST - 2 VIEW COMPARISON:  Radiograph 10/06/2019 FINDINGS: Low volumes with marked central pulmonary vascular congestion, diffuse hazy reticular interstitial opacities, fissural in septal thickening, and central vascular cuffing. No pneumothorax or visible effusion. Cardiac silhouette appears enlarged from comparison semi upright AP radiograph and may reflect cardiomegaly or pericardial effusion. No pneumothorax. No pleural effusion. Edematous changes seen in the soft tissues. The osseous structures appear diffusely demineralized which may be in keeping with patient's history of renal osteodystrophy although a more lytic appearing lesion is seen involving the right humeral head near the greater tuberosity. IMPRESSION: 1. Findings suggestive of volume overload/CHF. 2. 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 A more focal lytic appearing lesion involving the right humeral head about the greater tuberosity is somewhat more conspicuous though may be on the spectrum of osteodystrophy and hyperparathyroidism possibly reflecting lesion such as brown tumor. Recommend correlation with patient's symptoms and consideration of dedicated shoulder  radiography for more appropriate visualization. These results will be called to the ordering clinician or representative by the Radiologist Assistant, and communication documented in the PACS or [employer]. Electronically Signed   By: Lovena Le M.D.   On: 10/11/2019 15:31   DG Shoulder Right  Result Date: 10/11/2019 CLINICAL DATA:  End-stage renal disease, right shoulder lytic lesion EXAM: RIGHT SHOULDER - 2+ VIEW COMPARISON:  CT 10/05/2019 FINDINGS: Evaluation of the right shoulder is slightly limited by suboptimal positioning. There is no definite fracture or dislocation, however. There are multiple faint subcortical lytic lesions demonstrating thin sclerotic margins within the right humeral head. A similar lesion is seen within the distal right clavicle at the acromioclavicular articulation. Given the patient's history of renal insufficiency, these likely represent brown tumors of secondary hyperparathyroidism less likely, these could reflect subchondral cyst in the setting advanced degenerative arthritis. Metastatic disease is not completely excluded, though the multiplicity of lesions only within the humeral head would argue against this. Myeloma typically would not demonstrate the sclerotic margins identified. Visualized right thorax is unremarkable. 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 Correlation with serum parathyroid hormone level may be helpful. Electronically Signed   By: Fidela Salisbury MD   On: 10/11/2019 19:58     Assessment and Plan:   Atypical chest pain - Patient admitted 10/1 for inability to obtain right femoral dilysis catheter and has since had a complicated admission - Pt was to be discharged today however complained of chest pain during PT. Did not receive nitro or aspirin. Also has some pleuritic and positional  chest pain - HS trop 21>19, continue to trend. EKG ordered but not obtained. EKG from yesterday shows SR with no ischemic changes. Would repeat.  - Recent echo 10/05/19 showed LVEF 65-70%, G2DD, mild LVH, mod dilated LA, trivial MR, mod calcification of AV and evidence of severe stenosis, mean gradient of 37.44mmHg - On plavix and ASA 325mg  per [facility] - Patient has no prior history of CAD. Prior history of HTN, also has +FH. No hx of HLD or DM2. Low suspicion at this point for ACS given down trending troponin. Any further work-up can be outpatient. Can consider risk stratifcation with A1C and FLP.  Severe AS - recent echo showed severe AS with mean gradient 37.34mmHG. EF normal - Murmur on exam - Will discuss options with MD however not a good candidate for AVR given bacteremia/high risk for infections  ESRD on HD - continue MWF HD  PAD - closely followed by [facility] - ASA 325mg  and plavix  Per IM/ID - diarrhea - Left thigh wound s/p left thigh graft incision 10/2 with wound VAC on abx - Left knee pain s/p joint aspiration and arthroscopic I&D - Fever with negative BC on abx    For questions or updates, please contact [facility] Please consult [email or web address] for contact info under    Signed, Cadence Ninfa Meeker, PA-C  10/14/2019 2:35 PM   I have personally seen and examined this patient. I agree with the assessment and plan as outlined above.  54 yo male with history of moderately severe AS, PAD, ESRD on HD but no known CAD who was admitted due to access site issues. He now has a wound vac on his left thigh. He describes right sided chest pain, sometimes worsened with position and inspiration. EKG has shown no ischemic changes. He has no chest pain currently. Troponin flat and around 20 which is not unexpected in a dialysis patient.   I have reviewed his echo from 10/05/19 which shows normal LV systolic function with severe AS, mean gradient 37 mmHg with dimensionless index 0.25, AVA 0.77  cm2.   My exam:  General: Well developed, well nourished, NAD  HEENT: OP clear, mucus membranes moist  SKIN: warm, dry. No rashes. Wound vac on left thigh Neuro: No focal deficits  Musculoskeletal: Muscle strength 5/5 all ext  Psychiatric: Mood and affect normal  Neck: No JVD, no carotid bruits, no thyromegaly, no lymphadenopathy.  Lungs:Clear bilaterally, no wheezes, rhonci, crackles Cardiovascular: Regular rate and rhythm. Harsh systolic murmur.  Abdomen:Soft. Bowel sounds present. Non-tender.  Extremities: No lower extremity edema  Plan:  1. Chest pain: His pain is atypical. No significant elevation of troponin. I suspect that his pain is not cardiac related. He will need an ischemic evaluation soon in order to plan for aortic valve replacement as his AS is now severe. This workup can be as an outpatient. I would prefer to have the wound on his left thigh healed before any further cardiac workup. Given history of access site infection/wounds and bacteremia, he will be high risk for AVR with potential for prosthetic valve infection.   He is anxious about going home and is asking to stay overnight. I think this is reasonable given the chest pain he had today. We will arrange close follow up in our office.   Lauree Chandler 10/14/2019 4:14 PM

## 2019-10-14 NOTE — Progress Notes (Signed)
Family Medicine Teaching Service Daily Progress Note Intern Pager: [phone]  Patient name: [name] Medical record number: [id number] Date of birth: [birth year] Age: 54 y.o. Gender: male  Primary Care Provider: [name], MD Consultants: Nephrology, vascular surgery, orthopedics Code Status: FULL  Pt Overview and Major Events to Date:  10/1 Admitted, HD attempted (terminated early due to pain) 10/2Inferior venacavogram(right), I&D of left thigh with graft excision, HD 10/4 Joint aspiration left knee 10/5 Arthroscopic left knee irrigation and drainage with placement of antibiotic beads  Assessment and Plan: [name] is a 54 y.o. male presenting with inability to obtain access at his right femoral dialysis catheter while at HD, now with acute knee pain and swelling with fever concerning for septic arthritis. PMH is significant for ESRD on HD (MWF), PAD, kidney stones, spina bifida, hx of nephrostomy tube, HTN, GERD, anxiety.  Diarrhea Possibly due to Colace or antibiotics. Improved, no diarrhea since yesterday morning. - home phenergan - loperamide prn today - psyllium  Left thigh wound S/p I&D of left thigh with graft excision 10/2. Wound culture growing few corynebacterium striatum. ID consulted, appreciate recommendations.  Plan to replace wound VAC today. - replace wound VAC, will monitor - antibiotics as below  Acute left knee pain Concerning for septic arthritis s/p joint aspiration and arthroscopic left knee irrigation and drainage with placement of antibiotic beads. Cultures negative.Orthopedics following, appreciate involvement. OK to discharge from an orthopedics standpoint.  Overall improving. - oxycodone 10 mg q6 prn - scheduled Tylenol  Fever Concern for septic arthritis possible given acute onset of left knee pain and swelling; however, cultures negative. As above, wound culture growing few corynebacterium striatum. Blood cultures and synovial  fluid cultures negative. ID following, appreciate involvement.  Per ID, antibiotic plan: Vancomycin and ceftazidime MWF with dialysis for 4 weeks total course. Afebrile since 10/4.   - s/p piperacillin-tazobactam (10/4-10/6) - s/p CTX (10/6-10/7) - vancomycin MWF with HD (10/4-) - ceftazidime MWF with HD (10/8-) - Tylenol prn  ESRD MWF dialysis, continue while inpatient per nephrology.  PAD At home on ASA 325 mg and clopidogrel. States he takes these so his dialysis catheter doesn't clot. - continue home meds  Atypical chest pain Right sided, worse with inspiration. Suspect MSK etiology given reproducible tenderness on exam. Severe aortic stenosis may also be contributing. EKGs have been unremarkable. No wall motion abnormalities on TTE.  Aortic stenosis Severe AS noted on echo this admission. Likely contributing to his symptoms of chest pain and shortness of breath. - outpatient follow-up (sees Dr. Angelena Keith)  Insomnia Takes zolpidem prn for sleep, trazodone prn for nightmares. - continue home meds (trazodone reordered)  FEN/GI: renal diet PPx: SCDs  Disposition: med-surg, discharge home with home health PT possibly later today if wound VAC successful  Subjective:  This morning, patient states diarrhea is improved and has not had any episodes since yesterday morning.  His primary concern is the wound VAC given that he has had problems with clotting in the past and thinks it may be related to his knee pain.  Willing to retry wound VAC today.  He states he would prefer a smaller wound VAC with a strap given his issues with mobility.   Objective: Temp:  [97.9 F (36.6 C)-98.7 F (37.1 C)] 98.1 F (36.7 C) (10/12 0849) Pulse Rate:  [66-77] 69 (10/12 0849) Resp:  [16-18] 18 (10/12 0849) BP: (105-120)/(24-62) 113/46 (10/12 0849) SpO2:  [99 %-100 %] 99 % (10/12 0849) Physical Exam: General:Obese male, NAD Cardiovascular:RRR, 3/6  systolic murmur best heard at  LUSB Respiratory:CTAB Abdomen:soft, mild left-sided tenderness, +BS Extremities:WWP, no edema, left lower extremity ROM limited by pain Derm: left lower extremity dressings CDI, left flank wound with serosanguinous drainage  Laboratory: Recent Labs  Lab 10/12/19 0500 10/13/19 0143 10/14/19 0306  WBC 5.5 5.9 5.4  HGB 8.7* 8.5* 8.8*  HCT 29.3* 28.1* 29.4*  PLT 205 193 196   Recent Labs  Lab 10/12/19 0500 10/13/19 0143 10/14/19 0306  NA 137 137 136  K 4.9 5.5* 5.0  CL 100 99 97*  CO2 24 24 25   BUN 59* 75* 51*  CREATININE 8.78* 10.70* 7.72*  CALCIUM 9.1 9.3 9.5  PROT 7.2 7.1 7.6  BILITOT 0.4 0.8 0.3  ALKPHOS 59 68 69  ALT 5 6 6   AST 8* 7* 10*  GLUCOSE 101* 83 91    Imaging/Diagnostic Tests: No results found.   Darrell Button, MD 10/14/2019, 12:23 PM PGY-1, [place] Intern pager: [phone], text pages welcome

## 2019-10-14 NOTE — Progress Notes (Signed)
11:46 AM Dr. Nancy Fetter was paged that the patient wanted my attendance in the room.  Present at bedside at 11:50 AM.  The patient reports he is appealing his discharge today.  I discussed that his plan is for discharge only if his wound VAC placement went well and he worked with PT. No discharge orders or plan in place.  Message from PT was received during rounding that he was shaky during his evaluation.  The patient reported to me that he had worsening chest pain during physical therapy.  This is on the right side.  This is nonradiating.  This is somewhat pleuritic in nature.  CTPA negative on admission.  He reports that this is a 4 out of 10 at worst. This had been a 9 or 10 out of 10 on the day of admission.  This is more severe and new type of chest pain today than he has had since admission.  As such we will evaluate with x-ray, EKG and serial troponins.  Discussed that he will not be discharging today.  I am unsure if he can appeal his discharge as we do not intend to discharge him given the needed evaluation for his chest pain.  Dorris Singh, MD  Family Medicine Teaching Service

## 2019-10-15 DIAGNOSIS — I350 Nonrheumatic aortic (valve) stenosis: Secondary | ICD-10-CM | POA: Diagnosis not present

## 2019-10-15 DIAGNOSIS — R072 Precordial pain: Secondary | ICD-10-CM | POA: Diagnosis not present

## 2019-10-15 DIAGNOSIS — T827XXD Infection and inflammatory reaction due to other cardiac and vascular devices, implants and grafts, subsequent encounter: Secondary | ICD-10-CM | POA: Diagnosis not present

## 2019-10-15 DIAGNOSIS — M009 Pyogenic arthritis, unspecified: Secondary | ICD-10-CM | POA: Diagnosis not present

## 2019-10-15 LAB — COMPREHENSIVE METABOLIC PANEL
ALT: 6 U/L (ref 0–44)
AST: 9 U/L — ABNORMAL LOW (ref 15–41)
Albumin: 2.9 g/dL — ABNORMAL LOW (ref 3.5–5.0)
Alkaline Phosphatase: 72 U/L (ref 38–126)
Anion gap: 12 (ref 5–15)
BUN: 73 mg/dL — ABNORMAL HIGH (ref 6–20)
CO2: 26 mmol/L (ref 22–32)
Calcium: 9.4 mg/dL (ref 8.9–10.3)
Chloride: 97 mmol/L — ABNORMAL LOW (ref 98–111)
Creatinine, Ser: 9.69 mg/dL — ABNORMAL HIGH (ref 0.61–1.24)
GFR, Estimated: 5 mL/min — ABNORMAL LOW (ref >60)
Glucose, Bld: 86 mg/dL (ref 70–99)
Potassium: 5.4 mmol/L — ABNORMAL HIGH (ref 3.5–5.1)
Sodium: 135 mmol/L (ref 135–145)
Total Bilirubin: 0.4 mg/dL (ref 0.3–1.2)
Total Protein: 7.3 g/dL (ref 6.5–8.1)

## 2019-10-15 LAB — CBC
HCT: 28.6 % — ABNORMAL LOW (ref 39.0–52.0)
Hemoglobin: 8.4 g/dL — ABNORMAL LOW (ref 13.0–17.0)
MCH: 28.9 pg (ref 26.0–34.0)
MCHC: 29.4 g/dL — ABNORMAL LOW (ref 30.0–36.0)
MCV: 98.3 fL (ref 80.0–100.0)
Platelets: 179 10*3/uL (ref 150–400)
RBC: 2.91 MIL/uL — ABNORMAL LOW (ref 4.22–5.81)
RDW: 17.9 % — ABNORMAL HIGH (ref 11.5–15.5)
WBC: 5.1 10*3/uL (ref 4.0–10.5)
nRBC: 0 % (ref 0.0–0.2)

## 2019-10-15 MED ORDER — ALTEPLASE 2 MG IJ SOLR: 5.8000 mg | Freq: Once | INTRAMUSCULAR | Status: AC

## 2019-10-15 MED ORDER — DOXERCALCIFEROL 4 MCG/2ML IV SOLN
INTRAVENOUS | Status: AC
  Filled 2019-10-15: qty 2

## 2019-10-15 MED ORDER — HEPARIN SODIUM (PORCINE) 1000 UNIT/ML IJ SOLN
INTRAMUSCULAR | Status: AC
  Administered 2019-10-15: 3000 [IU]
  Filled 2019-10-15: qty 3

## 2019-10-15 MED ORDER — ALTEPLASE 2 MG IJ SOLR
INTRAMUSCULAR | Status: AC
  Administered 2019-10-15: 5.8 mg
  Filled 2019-10-15: qty 6

## 2019-10-15 MED ORDER — DIPHENHYDRAMINE HCL 50 MG/ML IJ SOLN
INTRAMUSCULAR | Status: AC
  Administered 2019-10-15: 12.5 mg
  Filled 2019-10-15: qty 1

## 2019-10-15 MED ORDER — PROMETHAZINE HCL 25 MG/ML IJ SOLN: 12.5000 mg | Freq: Once | INTRAMUSCULAR | Status: AC | PRN

## 2019-10-15 MED ORDER — VANCOMYCIN HCL IN DEXTROSE 1-5 GM/200ML-% IV SOLN
INTRAVENOUS | Status: AC
  Administered 2019-10-15: 1000 mg via INTRAVENOUS
  Filled 2019-10-15: qty 200

## 2019-10-15 MED ORDER — METOPROLOL TARTRATE 25 MG PO TABS
25.0000 mg | ORAL_TABLET | Freq: Once | ORAL | Status: AC
  Administered 2019-10-16: 25 mg via ORAL
  Filled 2019-10-15: qty 1

## 2019-10-15 NOTE — Progress Notes (Signed)
[facility] Progress Note   Subjective:    Seen in dialysis unit. BFR 200 via TDC Wound VAC in place No new complaints today    Objective Vitals:   10/15/19 0720 10/15/19 0724 10/15/19 0729 10/15/19 0800  BP: 134/85 136/83 133/80 109/63  Pulse: 63  66 66  Resp: 13 16  14   Temp: 98.2 F (36.8 C)     TempSrc: Oral     SpO2: 99%     Weight:      Height:       Physical Exam General:chronically ill appearing obese, male in NAD Heart:RRR, +2/9 systolic murmur Lungs: Clear, bilaterally  Abdomen: soft, mild diffuse tenderness  Extremities:trace LE edema, L knee wrapped, L thigh wound dressed Dialysis Access: R fem [facility]   Filed Weights   10/13/19 0711 10/13/19 1155 10/14/19 2043  Weight: 117.1 kg 114.4 kg 116.6 kg    Intake/Output Summary (Last 24 hours) at 10/15/2019 0838 Last data filed at 10/15/2019 0600 Gross per 24 hour  Intake 960 ml  Output 0 ml  Net 960 ml    Additional Objective Labs: Basic Metabolic Panel: Recent Labs  Lab 10/09/19 0156 10/09/19 0156 10/10/19 0352 10/11/19 1209 10/13/19 0143 10/14/19 0306 10/15/19 0256  NA 135   < > 135   < > 137 136 135  K 4.6   < > 5.0   < > 5.5* 5.0 5.4*  CL 94*   < > 98   < > 99 97* 97*  CO2 25   < > 25   < > 24 25 26   GLUCOSE 107*   < > 108*   < > 83 91 86  BUN 45*   < > 69*   < > 75* 51* 73*  CREATININE 6.72*   < > 8.74*   < > 10.70* 7.72* 9.69*  CALCIUM 9.5   < > 9.3   < > 9.3 9.5 9.4  PHOS 5.2*  --  5.6*  --   --   --   --    < > = values in this interval not displayed.   Liver Function Tests: Recent Labs  Lab 10/13/19 0143 10/14/19 0306 10/15/19 0256  AST 7* 10* 9*  ALT 6 6 6   ALKPHOS 68 69 72  BILITOT 0.8 0.3 0.4  PROT 7.1 7.6 7.3  ALBUMIN 2.7* 2.9* 2.9*   CBC: Recent Labs  Lab 10/11/19 2230 10/11/19 2230 10/12/19 0500 10/12/19 0500 10/13/19 0143 10/14/19 0306 10/15/19 0256  WBC 5.0   < > 5.5   < > 5.9 5.4 5.1  NEUTROABS 3.5  --   --   --   --   --   --   HGB 9.2*    < > 8.7*   < > 8.5* 8.8* 8.4*  HCT 31.0*   < > 29.3*   < > 28.1* 29.4* 28.6*  MCV 98.4  --  99.0  --  98.3 98.3 98.3  PLT 181   < > 205   < > 193 196 179   < > = values in this interval not displayed.   Blood Culture    Component Value Date/Time   SDES FLUID 10/07/2019 1517   SPECREQUEST LEFT KNEE JOINT SPEC A 10/07/2019 1517   CULT  10/07/2019 1517    No growth aerobically or anaerobically. Performed at [facility], [place] [address], [place], [place] [address]    REPTSTATUS 10/12/2019 FINAL 10/07/2019 1517    Studies/Results:  DG Chest 2 View  Result Date: 10/14/2019 CLINICAL DATA:  Shortness of breath. EXAM: CHEST - 2 VIEW COMPARISON:  Chest radiograph 10/11/2019, 10/06/2019 FINDINGS: Stable cardiomediastinal contours. Central venous congestion without overt edema. Low lung volumes. A few scattered linear opacities left base likely reflect atelectasis. No pneumothorax or large pleural effusion. Diffuse demineralization of the bones. No acute osseous finding. IMPRESSION: Low volume study. Central venous congestion without overt edema. Minimal left basilar atelectasis. Electronically Signed   By: Audie Pinto M.D.   On: 10/14/2019 12:57    Medications: . cefTAZidime (FORTAZ)  IV 2 g (10/13/19 1322)  . vancomycin 1,000 mg (10/13/19 1055)   . acetaminophen  1,000 mg Oral Q8H  . aspirin EC  325 mg Oral Daily  . calcium acetate  2,001 mg Oral With snacks  . calcium acetate  2,668 mg Oral TID with meals  . Chlorhexidine Gluconate Cloth  6 each Topical Q0600  . clopidogrel  75 mg Oral Daily  . darbepoetin (ARANESP) injection - DIALYSIS  200 mcg Intravenous Q Mon-HD  . doxercalciferol  3 mcg Intravenous Q M,W,F-HD  . famotidine  10 mg Oral Q M,W,F-2000  . heparin  5,000 Units Subcutaneous Q8H  . levothyroxine  137 mcg Oral QAC breakfast  . lidocaine  2 patch Transdermal Daily  . psyllium  1 packet Oral Daily  . sodium zirconium cyclosilicate  10 g Oral QODAY    Dialysis  [facility] Optiflux 200NRe, Time: 4hr 89min, BFR 350, DFR 800, EDW 114.5kg, 2K/2.,25Ca, TDC heparin 3700 unit bolus Mircera 277mcg IV q 2 weeks- last dose 9/27 Venofer 100mg  q HD -last dose due today Phoslo 4 caps TO TID with meals and 3 with snacks Hectorol 3 mcg IV q HD Kayexalate 15g PO on non-dialysis days  Assessment/Plan: 1. ESRD:HD MWF. S/p [facility] manipulation by Dr. Trula Slade 10/2 as well as L thigh debridement with old AVG removal.TDC running better since intervention, giving cath flo post each HD. Access options limited. HD today on schedule.  2. Hyperkalemia:Recurrent issue -pt takes kayexalate on non-dialysis days --changed to lokelma d/t issues with diarrhea. 3. Fevers. Blood cultures neg to date. Possible sources of infection include: dialysis catheter, left thigh wound, septic knee-- wound cultures neg to date. IV antibiotics per primary team.  Per ID continue Vanc/Fortaz TIW with HD until 11/2  4. L thigh wound/infected old AVG: S/p excision/debridement 10/2 -wet to dry dressings. Wound vac d/c due to dysfxn, may try again. Wont' be able to do dressings at home. Wound VAC replaced 10/12.  5. L knee pain/effusion - s/p joint aspiration 10/4. Concern for septic arthrits 25K WBCs insynovialfluid. To OR for I &D 10/5 --Culturesneg to date. 6. Hypertension/volume:BPlow/stable.Not getting to EDW, continue to UF as tolerated. CXR shows volume overload, lungs sound clear.  Reinforced fluid restrictions.  Now at outpatient dry weight.  7. Anemia:Hgb 8.5 . Aranesp 200 dosed 68/08.  8. Metabolic bone disease:Ca/Phos ok. Continue hectorol and phoslo. 9. Nutrition:Renal diet with 1.2L fluid restriction recommended. Prot supp for low alb.  10. L flank wound:D/t prior PCN tube - some erythema, no drainage - pt reports this is improved, follow closely. 11. Diarrhea -D/c colace  12. Dispo -Pending   [facility] 10/15/2019,8:38 AM

## 2019-10-15 NOTE — Progress Notes (Signed)
OT Cancellation Note  Patient Details Name: Verlin Uher MRN: [id number] DOB: 1965/12/19   Cancelled Treatment:    Reason Eval/Treat Not Completed: Patient at procedure or test/ unavailable- pt in HD. Will follow and see as able.   Jolaine Artist, OT Acute Rehabilitation Services Pager [phone] Office [phone]   Delight Stare 10/15/2019, 11:08 AM

## 2019-10-15 NOTE — Progress Notes (Signed)
Progress Note  Patient Name: [name] Date of Encounter: 10/15/2019  [facility] Cardiologist: Darrell Chandler, MD   Subjective   He had chest pain with exertion again today. No dyspnea. He describes anxiety about his health situation and worries about all of his issues.   Inpatient Medications    Scheduled Meds: . acetaminophen  1,000 mg Oral Q8H  . aspirin EC  325 mg Oral Daily  . calcium acetate  2,001 mg Oral With snacks  . calcium acetate  2,668 mg Oral TID with meals  . Chlorhexidine Gluconate Cloth  6 each Topical Q0600  . clopidogrel  75 mg Oral Daily  . darbepoetin (ARANESP) injection - DIALYSIS  200 mcg Intravenous Q Mon-HD  . doxercalciferol  3 mcg Intravenous Q M,W,F-HD  . famotidine  10 mg Oral Q M,W,F-2000  . heparin  5,000 Units Subcutaneous Q8H  . levothyroxine  137 mcg Oral QAC breakfast  . lidocaine  2 patch Transdermal Daily  . psyllium  1 packet Oral Daily  . sodium zirconium cyclosilicate  10 g Oral QODAY   Continuous Infusions: . cefTAZidime (FORTAZ)  IV Stopped (10/15/19 1341)  . vancomycin Stopped (10/15/19 1341)   PRN Meds: Muscle Rub, [DISCONTINUED] oxyCODONE-acetaminophen **AND** oxyCODONE, promethazine, traZODone, zolpidem   Vital Signs    Vitals:   10/15/19 1030 10/15/19 1100 10/15/19 1130 10/15/19 1200  BP: (!) 108/58 (!) 101/50 (!) 104/53 102/75  Pulse: 67 71 74 76  Resp: 14 14 14 14   Temp:    98.8 F (37.1 C)  TempSrc:    Oral  SpO2:    100%  Weight:      Height:        Intake/Output Summary (Last 24 hours) at 10/15/2019 1510 Last data filed at 10/15/2019 1200 Gross per 24 hour  Intake 600 ml  Output 1908 ml  Net -1308 ml   Last 3 Weights 10/14/2019 10/13/2019 10/13/2019  Weight (lbs) 257 lb 0.9 oz 252 lb 3.3 oz 258 lb 2.5 oz  Weight (kg) 116.6 kg 114.4 kg 117.1 kg      Telemetry    Sinus - Personally Reviewed  ECG    Sinus with no ischemic changes - Personally Reviewed  Physical Exam   GEN:  No acute distress.   Neck: No JVD Cardiac: RRR, systolic murmur.   Respiratory: Clear to auscultation bilaterally. GI: Soft, nontender, non-distended  MS: No edema; No deformity. Neuro:  Nonfocal  Psych: Normal affect   Labs    High Sensitivity Troponin:   Recent Labs  Lab 10/14/19 1208 10/14/19 1406  TROPONINIHS 21* 19*      Chemistry Recent Labs  Lab 10/13/19 0143 10/14/19 0306 10/15/19 0256  NA 137 136 135  K 5.5* 5.0 5.4*  CL 99 97* 97*  CO2 24 25 26   GLUCOSE 83 91 86  BUN 75* 51* 73*  CREATININE 10.70* 7.72* 9.69*  CALCIUM 9.3 9.5 9.4  PROT 7.1 7.6 7.3  ALBUMIN 2.7* 2.9* 2.9*  AST 7* 10* 9*  ALT 6 6 6   ALKPHOS 68 69 72  BILITOT 0.8 0.3 0.4  GFRNONAA 5* 7* 5*  ANIONGAP 14 14 12      Hematology Recent Labs  Lab 10/13/19 0143 10/14/19 0306 10/15/19 0256  WBC 5.9 5.4 5.1  RBC 2.86* 2.99* 2.91*  HGB 8.5* 8.8* 8.4*  HCT 28.1* 29.4* 28.6*  MCV 98.3 98.3 98.3  MCH 29.7 29.4 28.9  MCHC 30.2 29.9* 29.4*  RDW 17.6* 17.6* 17.9*  PLT 193  196 179    BNPNo results for input(s): BNP, PROBNP in the last 168 hours.   DDimer No results for input(s): DDIMER in the last 168 hours.   Radiology    DG Chest 2 View  Result Date: 10/14/2019 CLINICAL DATA:  Shortness of breath. EXAM: CHEST - 2 VIEW COMPARISON:  Chest radiograph 10/11/2019, 10/06/2019 FINDINGS: Stable cardiomediastinal contours. Central venous congestion without overt edema. Low lung volumes. A few scattered linear opacities left base likely reflect atelectasis. No pneumothorax or large pleural effusion. Diffuse demineralization of the bones. No acute osseous finding. IMPRESSION: Low volume study. Central venous congestion without overt edema. Minimal left basilar atelectasis. Electronically Signed   By: Audie Pinto M.D.   On: 10/14/2019 12:57    Cardiac Studies   Echo 10/05/19:  1. Left ventricular ejection fraction, by estimation, is 65 to 70%. The  left ventricle has normal function. The  left ventricle has no regional  wall motion abnormalities. There is mild left ventricular hypertrophy.  Left ventricular diastolic parameters  are consistent with Grade II diastolic dysfunction (pseudonormalization).  2. Right ventricular systolic function is normal. The right ventricular  size is normal. Tricuspid regurgitation signal is inadequate for assessing  PA pressure.  3. Left atrial size was moderately dilated.  4. The mitral valve is abnormal. Trivial mitral valve regurgitation.  Moderate mitral annular calcification.  5. The aortic valve is functionally bicuspid. There is moderate  calcification of the aortic valve and evidence of severe stenosis. Aortic  valve mean gradient measures 37.2 mmHg. Aortic valve Vmax measures 3.88  m/s. Dimentionless index 0.25  6. The inferior vena cava is normal in size with greater than 50%  respiratory variability, suggesting right atrial pressure of 3 mmHg.   Patient Profile     54 y.o. male with history of moderately severe to severe AS, bacterial endocarditis, HTN, ESRD on HD, GERD, seizure disorder, spina bifida with myelomeningocele, kidney stones, s/p stenting of left iliac artery in 2018 followed closely by Dr. Donzetta Matters in [facility],  and hypothyroidism who is being seen today for the evaluation of chest pain  Assessment & Plan    1. Aortic stenosis 2. Chest pain  He has no known CAD but his aortic stenosis is now near severe. He is having exertional chest pain. Risk factors for CAD include HTN and PAD. He also has ESRD and is on HD. His pain had many atypical features when I saw him yesterday. I cannot exclude obstructive CAD without further testing. It is also possible that some of his symptoms could be due to his aortic stenosis but he is a really poor candidate for consideration of aortic valve replacement given his open wound and episodes of bacteremia. He is chronically anemic would could also be contributing to his symptoms.   I will  arrange a gated cardiac CTA tomorrow to assess for CAD.    For questions or updates, please contact [name] Please consult [email or web address] for contact info under        Signed, Darrell Chandler, MD  10/15/2019, 3:10 PM

## 2019-10-15 NOTE — Progress Notes (Addendum)
Family Medicine Teaching Service Daily Progress Note Intern Pager: [phone]  Patient name: [name] Medical record number: [id number] Date of birth: April 10, 1965 Age: 54 y.o. Gender: male  Primary Care Provider: [name], MD Consultants: Nephrology, vascular surgery, orthopedics Code Status: FULL  Pt Overview and Major Events to Date:  10/1 Admitted, HD attempted (terminated early due to pain) 10/2Inferior venacavogram(right), I&D of left thigh with graft excision, HD 10/4 Joint aspiration left knee 10/5 Arthroscopic left knee irrigation and drainage with placement of antibiotic beads  Assessment and Plan: [name] is a 54 y.o. male presenting with inability to obtain access at his right femoral dialysis catheter while at HD, now with acute knee pain and swelling with fever concerning for septic arthritis. PMH is significant for ESRD on HD (MWF), PAD, kidney stones, spina bifida, hx of nephrostomy tube, HTN, GERD, anxiety.  Diarrhea Resolved.  Left thigh wound S/p I&D of left thigh with graft excision 10/2. Wound culture growing few corynebacterium striatum. ID consulted, appreciate recommendations.  Wound VAC replaced, no issues. - antibiotics as below  Acute left knee pain S/p joint aspiration and arthroscopic left knee irrigation and drainage with placement of antibiotic beads. Cultures negative. OK to discharge from an orthopedics standpoint.  Overall improving. - oxycodone 10 mg q6 prn - scheduled Tylenol  Fever Concern for septic arthritis possible given acute onset of left knee pain and swelling; however, cultures negative. As above, wound culture growing few corynebacterium striatum. Blood cultures and synovial fluid cultures negative. ID following, appreciate involvement.  Per ID, antibiotic plan: Vancomycin and ceftazidime MWF with dialysis for 4 weeks total course. Afebrile since 10/4.   - s/p piperacillin-tazobactam (10/4-10/6) - s/p CTX  (10/6-10/7) - vancomycin MWF with HD (10/4-) - ceftazidime MWF with HD (10/8-) - Tylenol prn  ESRD MWF dialysis, continue while inpatient per nephrology.  PAD At home on ASA 325 mg and clopidogrel. States he takes these so his dialysis catheter doesn't clot. - continue home meds  Atypical chest pain Right sided, worse with inspiration. Suspect MSK etiology given reproducible tenderness on exam. EKGs have been unremarkable. No wall motion abnormalities on TTE. Had exertional chest pain with PT yesterday, troponins were flat and EKG unremarkable. Cardiology consulted, no further workup indicated. No chest pain this morning. - f/u cardiology outpatient  Aortic stenosis Severe AS noted on echo this admission. - outpatient follow-up (sees Dr. Angelena Form)  Insomnia Takes zolpidem prn for sleep, trazodone prn for nightmares. - continue home meds (trazodone reordered)  FEN/GI: renal diet PPx: SCDs  Disposition: med-surg, discharge home with home health PT planning for today  Subjective:  Seen on HD this morning.  Denies any chest pain currently.  Objective: Temp:  [97.8 F (36.6 C)-99.2 F (37.3 C)] 98.8 F (37.1 C) (10/13 1200) Pulse Rate:  [63-76] 76 (10/13 1200) Resp:  [12-18] 14 (10/13 1200) BP: (101-136)/(40-99) 102/75 (10/13 1200) SpO2:  [96 %-100 %] 100 % (10/13 1200) Weight:  [116.6 kg] 116.6 kg (10/12 2043) Physical Exam: General:Obese male, NAD Cardiovascular:RRR, 3/6 systolic murmur best heard at LUSB Respiratory:CTAB Abdomen:soft, mild left-sided tenderness, +BS Extremities:WWP, no edema, left lower extremity ROM limited by pain Derm: left lower extremity dressings CDI, left flank wound with serosanguinous drainage, see image below:    Laboratory: Recent Labs  Lab 10/13/19 0143 10/14/19 0306 10/15/19 0256  WBC 5.9 5.4 5.1  HGB 8.5* 8.8* 8.4*  HCT 28.1* 29.4* 28.6*  PLT 193 196 179   Recent Labs  Lab 10/13/19 0143 10/14/19 0306  10/15/19 0256  NA 137 136 135  K 5.5* 5.0 5.4*  CL 99 97* 97*  CO2 24 25 26   BUN 75* 51* 73*  CREATININE 10.70* 7.72* 9.69*  CALCIUM 9.3 9.5 9.4  PROT 7.1 7.6 7.3  BILITOT 0.8 0.3 0.4  ALKPHOS 68 69 72  ALT 6 6 6   AST 7* 10* 9*  GLUCOSE 83 91 86    Imaging/Diagnostic Tests: No new imaging.   Zola Button, MD 10/15/2019, 2:46 PM PGY-1, [place] Intern pager: [phone], text pages welcome

## 2019-10-15 NOTE — Progress Notes (Signed)
FPTS Interim Progress Note  Returned page to RN regarding pt's chest pressure that radiates to the back after working with OT. Patient evaluated by cardiology yesterday who had low suspicion for ACS given down trending troponins.  Cardiology recommended further work-up to be outpatient.   Patient states pain started with PT and feels similar to yesterday.  He also reports some lightheadedness and also has a headache, which he did not have yesterday.  He states he is very worried about his chest pain and is requesting to have a stress test done.  O: BP 102/75 (BP Location: Right Wrist)   Pulse 76   Temp 98.8 F (37.1 C) (Oral)   Resp 14   Ht 5\' 11"  (1.803 m)   Wt 116.6 kg   SpO2 100%   BMI 35.85 kg/m   CV: RRR, harsh systolic murmur MSK: chest pain reproducible on palpation  A/P: Pt with similar chest discomfort to yesterday however will obtain EKG to evaluate for ACS. Still reproducible on palpation. Patient has scheduled cardiology follow up 11/04/19.  - EKG reviewed, NSR - discussed with Dr. Angelena Form, cardiology - he will see and determine need for further workup  Zola Button, MD 10/15/2019, 2:18 PM PGY-1, [place] Medicine Service pager [phone]

## 2019-10-15 NOTE — Progress Notes (Signed)
FMTS Brief Note  Patient seen and examined while was in HD. Requested nausea medication HD, ordered by examiner and given by HD nurse.   Left thigh wound, vac in place, functioning well, tolerated placement without IV medication. Appears to be doing well with this. Change with HH on 10/14 this week,  Presumed septic arthritis, antibiotics with HD. Will follow up with ID.   Atypical chest pain, evaluated by Cardiology. Has had CT for PE, which was negative. Troponins flat, has know severe AS. Plan for outpatient evaluation. Again, reproducible. Likely MSK. Discussed and patient reassured.   Diarrhea, resolved. Will discontinue metamucil on discharge to ensure he does not become constipated. Can use PRN at home.  Will need exam of chronic non-healing left flank wound prior to discharge this PM.   Plan for home after HD today, discussed with patient who is amenable at this time.  Will sign resident note as available.   Dorris Singh, MD  Family Medicine Teaching Service

## 2019-10-15 NOTE — Progress Notes (Signed)
   10/15/19 1200  Hand-Off documentation  Handoff Given Given to shift RN/LPN  Report given to (Full Name) [place],RN  Handoff Received Received from Transfer Unit/facility  Report received from (Full Name) Jerrye Bushy  Vitals  Temp 98.8 F (37.1 C)  Temp Source Oral  BP 102/75  BP Location Right Wrist  BP Method Automatic  Patient Position (if appropriate) Lying  Pulse Rate 76  Pulse Rate Source Monitor  ECG Heart Rate 72  Resp 14  Oxygen Therapy  SpO2 100 %  O2 Device Room Air  During Hemodialysis Assessment  Intra-Hemodialysis Comments Tx completed  Post-Hemodialysis Assessment  Rinseback Volume (mL) 250 mL  KECN 146 V  Dialyzer Clearance Lightly streaked  Duration of HD Treatment -hour(s) 4.5 hour(s)  Hemodialysis Intake (mL) 900 mL  UF Total -Machine (mL) 2808 mL  Net UF (mL) 1908 mL  Tolerated HD Treatment Yes  Post-Hemodialysis Comments tx complete-pt stable  Hemodialysis Catheter Right Femoral vein Double-lumen;Temporary  Placement Date/Time: 12/21/17 1936   Placed prior to admission: No  Time Out: Correct patient;Correct procedure;Correct site  Maximum sterile barrier precautions: Hand hygiene;Cap;Mask;Sterile gloves;Sterile gown;Large sterile sheet  Site Prep: Chlorh...  Site Condition No complications  Blue Lumen Status Flushed;Heparin locked;Capped (Central line)  Red Lumen Status Flushed;Heparin locked;Capped (Central line)  Catheter fill solution  (Cathflo)  Catheter fill volume (Arterial) 2.9 cc  Catheter fill volume (Venous) 2.9  Dressing Type Occlusive  Dressing Status Clean;Dry;Intact  Interventions New dressing;Dressing changed;Antimicrobial disc changed  Dressing Change Due 10/22/19  Post treatment catheter status Capped and Clamped  HD tx complete-pt stable. Difficulty with catheter, high arterial pressures, unable to run above 200 despite intervention. Grace,PA on unit and made aware. HD cath locked with cathflo.

## 2019-10-15 NOTE — Progress Notes (Signed)
Occupational Therapy Treatment Patient Details Name: [name] MRN: [id number] DOB: 02/26/1965 Today's Date: 10/15/2019    History of present illness [name] is a 54 y.o. male presenting with inability to obtain access at his right femoral dialysis catheter while at HD, now with acute knee pain and swelling with fever concerning for septic arthritis. PMH is significant for ESRD on HD (MWF), PAD, kidney stones, spina bifida, hx of nephrostomy tube, HTN, GERD, anxiety 10/1 Admitted, HD attempted (terminated early due to pain); 10/2Inferior venacavogram(right), I&D of left thigh with graft excision, HD, 10/4 Joint aspiration left knee; 10/5 Arthroscopic left knee irrigation and drainage with placement of antibiotic beads.    OT comments  Patient supine in bed and agreeable to OT session.  Discussed needs for dc home, recommendations to maintain WC level mainly at home for safety- today patient reports WC will not fit in his bathroom or bedroom and he keeps it mainly in his kitchen.  Further discussed safety and need to ensure he could safely manage to/from restroom, pt agreeable to attempt mobility to/from [facility] setup at home distance.  Patient completing bed mobility with supervision, min guard for transfers using RW and min guard for short distance mobility to 3:1 commode.  Once reaching commode, patient reports feeling chest pain (same as yesterday), light headed; vitals assessed (see below) and RN notified.  Assisted back to EOB using steady for safety and transitioned to supine with supervision.  MD in room to assess.  Based on performance today, continue to recommend SNF at dc but pt is declining.  For planned dc home, recommend 24/7 assistance and HHOT services.  Will follow.    Follow Up Recommendations  SNF;Home health OT;Supervision/Assistance - 24 hour (HHOT as pt declining SNF )    Equipment Recommendations  Toilet riser;3 in 1 bedside commode    Recommendations for Other  Services      Precautions / Restrictions Precautions Precautions: Fall;Other (comment) Precaution Comments: tunneled femoral cath (cleared for OOB/gait by nephrology PA), wound vac to L thigh  Restrictions Weight Bearing Restrictions: No Other Position/Activity Restrictions: WBAT       Mobility Bed Mobility Overal bed mobility: Needs Assistance Bed Mobility: Supine to Sit;Sit to Supine     Supine to sit: Supervision Sit to supine: Supervision   General bed mobility comments: supervision for safety   Transfers Overall transfer level: Needs assistance Equipment used: Rolling walker (2 wheeled) Transfers: Sit to/from Stand Sit to Stand: Min guard;From elevated surface         General transfer comment: EOB elevated to simulate home setup, min guard for safety/balance     Balance Overall balance assessment: Needs assistance Sitting-balance support: Feet supported;No upper extremity supported Sitting balance-Leahy Scale: Fair Sitting balance - Comments: supervision statically    Standing balance support: Bilateral upper extremity supported Standing balance-Leahy Scale: Poor Standing balance comment: relies on BUE support                            ADL either performed or assessed with clinical judgement   ADL Overall ADL's : Needs assistance/impaired                     Lower Body Dressing: Minimal assistance;Bed level Lower Body Dressing Details (indicate cue type and reason): requires assist to don L sock, min guard in standing   Toilet Transfer: Min guard;Ambulation;RW;BSC Toilet Transfer Details (indicate cue type and reason): simulated home  distance in prep for dc home          Functional mobility during ADLs: Min guard;Rolling walker;Cueing for safety;Cueing for sequencing General ADL Comments: pt limited by decreased activity tolerance      Vision       Perception     Praxis      Cognition Arousal/Alertness:  Awake/alert Behavior During Therapy: Anxious Overall Cognitive Status: Within Functional Limits for tasks assessed                                          Exercises     Shoulder Instructions       General Comments discussed needs for home and PT recommendations of using w/c for majority of mobility initally, pt reports w/c will not fit in bathroom OR bedroom, therefore discussed distance required for mobility to/from restroom.  After completing mobility to 3:1 commode (setup 4-6 feet from EOB), pt reports chest pain, discomfort and lightheadedness.  RN notified and vitals assessed (BP 106/60). Assisted back to EOB using steady for safety and MD present in room to assess.     Pertinent Vitals/ Pain       Pain Assessment: Faces Faces Pain Scale: Hurts a little bit Pain Location: L knee Pain Descriptors / Indicators: Grimacing;Guarding;Sharp Pain Intervention(s): Limited activity within patient's tolerance;Monitored during session;Repositioned  Home Living                                          Prior Functioning/Environment              Frequency  Min 2X/week        Progress Toward Goals  OT Goals(current goals can now be found in the care plan section)  Progress towards OT goals: Progressing toward goals  Acute Rehab OT Goals Patient Stated Goal: to get home  OT Goal Formulation: With patient  Plan Frequency remains appropriate;Discharge plan remains appropriate    Co-evaluation                 AM-PAC OT "6 Clicks" Daily Activity     Outcome Measure   Help from another person eating meals?: None Help from another person taking care of personal grooming?: A Little Help from another person toileting, which includes using toliet, bedpan, or urinal?: A Lot Help from another person bathing (including washing, rinsing, drying)?: A Little Help from another person to put on and taking off regular upper body clothing?: A  Little Help from another person to put on and taking off regular lower body clothing?: A Little 6 Click Score: 18    End of Session Equipment Utilized During Treatment: Gait belt;Rolling walker  OT Visit Diagnosis: Unsteadiness on feet (R26.81);Other abnormalities of gait and mobility (R26.89);Muscle weakness (generalized) (M62.81);Pain Pain - Right/Left: Left Pain - part of body: Knee   Activity Tolerance Treatment limited secondary to medical complications (Comment) (chest pain after limited mobility)   Patient Left in bed;with call bell/phone within reach;with bed alarm set;with nursing/sitter in room;Other (comment) (MD and RN present)   Nurse Communication Mobility status        Time: [phone] OT Time Calculation (min): 44 min  Charges: OT General Charges $OT Visit: 1 Visit OT Treatments $Self Care/Home Management : 38-52 mins  Adonis Brook B, OT Acute  Rehabilitation Services Pager [phone] Office [phone]    Delight Stare 10/15/2019, 2:50 PM

## 2019-10-16 ENCOUNTER — Inpatient Hospital Stay (HOSPITAL_COMMUNITY): Payer: Medicare Other

## 2019-10-16 DIAGNOSIS — I350 Nonrheumatic aortic (valve) stenosis: Secondary | ICD-10-CM

## 2019-10-16 DIAGNOSIS — R079 Chest pain, unspecified: Secondary | ICD-10-CM

## 2019-10-16 DIAGNOSIS — T829XXA Unspecified complication of cardiac and vascular prosthetic device, implant and graft, initial encounter: Secondary | ICD-10-CM | POA: Diagnosis not present

## 2019-10-16 DIAGNOSIS — I208 Other forms of angina pectoris: Secondary | ICD-10-CM

## 2019-10-16 DIAGNOSIS — N186 End stage renal disease: Secondary | ICD-10-CM | POA: Diagnosis not present

## 2019-10-16 DIAGNOSIS — M009 Pyogenic arthritis, unspecified: Secondary | ICD-10-CM | POA: Diagnosis not present

## 2019-10-16 DIAGNOSIS — R072 Precordial pain: Secondary | ICD-10-CM | POA: Diagnosis not present

## 2019-10-16 LAB — COMPREHENSIVE METABOLIC PANEL WITH GFR
ALT: 7 U/L (ref 0–44)
AST: 10 U/L — ABNORMAL LOW (ref 15–41)
Albumin: 2.9 g/dL — ABNORMAL LOW (ref 3.5–5.0)
Alkaline Phosphatase: 72 U/L (ref 38–126)
Anion gap: 9 (ref 5–15)
BUN: 57 mg/dL — ABNORMAL HIGH (ref 6–20)
CO2: 27 mmol/L (ref 22–32)
Calcium: 9.3 mg/dL (ref 8.9–10.3)
Chloride: 98 mmol/L (ref 98–111)
Creatinine, Ser: 7.92 mg/dL — ABNORMAL HIGH (ref 0.61–1.24)
GFR, Estimated: 7 mL/min — ABNORMAL LOW
Glucose, Bld: 93 mg/dL (ref 70–99)
Potassium: 5.6 mmol/L — ABNORMAL HIGH (ref 3.5–5.1)
Sodium: 134 mmol/L — ABNORMAL LOW (ref 135–145)
Total Bilirubin: 0.7 mg/dL (ref 0.3–1.2)
Total Protein: 7.2 g/dL (ref 6.5–8.1)

## 2019-10-16 LAB — CBC
HCT: 29.7 % — ABNORMAL LOW (ref 39.0–52.0)
Hemoglobin: 8.8 g/dL — ABNORMAL LOW (ref 13.0–17.0)
MCH: 29.7 pg (ref 26.0–34.0)
MCHC: 29.6 g/dL — ABNORMAL LOW (ref 30.0–36.0)
MCV: 100.3 fL — ABNORMAL HIGH (ref 80.0–100.0)
Platelets: 179 10*3/uL (ref 150–400)
RBC: 2.96 MIL/uL — ABNORMAL LOW (ref 4.22–5.81)
RDW: 18.2 % — ABNORMAL HIGH (ref 11.5–15.5)
WBC: 5.3 10*3/uL (ref 4.0–10.5)
nRBC: 0 % (ref 0.0–0.2)

## 2019-10-16 MED ORDER — NITROGLYCERIN 0.4 MG SL SUBL: 0.8000 mg | SUBLINGUAL_TABLET | Freq: Once | SUBLINGUAL | Status: AC

## 2019-10-16 MED ORDER — IOHEXOL 350 MG/ML SOLN
80.0000 mL | Freq: Once | INTRAVENOUS | Status: AC | PRN
  Administered 2019-10-16: 80 mL via INTRAVENOUS

## 2019-10-16 MED ORDER — ESCITALOPRAM OXALATE 10 MG PO TABS
5.0000 mg | ORAL_TABLET | Freq: Every day | ORAL | Status: DC
  Administered 2019-10-16 – 2019-10-18 (×3): 5 mg via ORAL
  Filled 2019-10-16 (×3): qty 1

## 2019-10-16 MED ORDER — NITROGLYCERIN 0.4 MG SL SUBL
SUBLINGUAL_TABLET | SUBLINGUAL | Status: AC
  Administered 2019-10-16: 0.8 mg via SUBLINGUAL
  Filled 2019-10-16: qty 2

## 2019-10-16 MED ORDER — HYDROXYZINE HCL 10 MG PO TABS: 10.0000 mg | ORAL_TABLET | Freq: Three times a day (TID) | ORAL | Status: DC | PRN

## 2019-10-16 NOTE — TOC Progression Note (Signed)
Transition of Care [facility]) - Progression Note    Patient Details  Name: [name] MRN: [id number] Date of Birth: 08/09/1965  Transition of Care [facility]) CM/SW Contact  Bartholomew Crews, RN Phone Number: [phone] 10/16/2019, 4:18 PM  Clinical Narrative:     Notified by MD that patient did not receive the bariatric RW and that he needed a 3N1. Spoke with patient at bedside. Verified wrong RW was delivered. Verified need for 3N1, however, patient states that he does not need bariatric for 3N1. Notified [employer] of wrong RW delivered to room and of need for 3N1. TOC following for transition needs.   Expected Discharge Plan: [place] Barriers to Discharge: Continued Medical Work up  Expected Discharge Plan and Services Expected Discharge Plan: [place]   Discharge Planning Services: CM Consult Post Acute Care Choice: Home Health, Durable Medical Equipment                   DME Arranged: Vac DME Agency: [facility] Date DME Agency Contacted: 10/04/19 Time DME Agency Contacted: 0930 Representative spoke with at DME Agency: [facility] HH Arranged: RN, Nurse's Aide [place] Agency: [facility] Date [place]: 10/04/19 Time [place]: 3220 Representative spoke with at [place]: [place] ([place]) Interventions    Readmission Risk Interventions No flowsheet data found.

## 2019-10-16 NOTE — Progress Notes (Signed)
Note to our office to arrange follow-up with [place] placed.

## 2019-10-16 NOTE — Progress Notes (Signed)
Discussed with patient regarding his history of anxiety.  Reports he has had anxiety most of his life.  He does report having panic attacks manifested by hyperventilation and nausea (never has had chest pain) about once a year only when he does something to upset his sister.  Has tried sertraline in the past for a short period of time, but stopped it due to anger issues.  He is currently prescribed lorazepam 1 mg daily as needed by his PCP.  Discussed with patient about starting another antidepressant and buspirone.  Patient is willing to try those medications.  Discussed with patient long-term risks of being on a benzodiazepine long-term.  Amenable to not restarting lorazepam at this time. Upon further review, buspirone is not recommended for patients of severe renal impairment.  We will try hydroxyzine instead. - escitalopram 5 mg daily (lower dose, will monitor for QT prolongation) - hydroxyzine 10 mg TID prn (renally dosed)

## 2019-10-16 NOTE — Progress Notes (Signed)
[facility] Progress Note   Subjective:    Completed dialysis yesterday net UF 1.9L. Catheter running at 200 BFR most of treatment.  Had chest pain after working with PT yesterday - cardiology evaluating  Objective Vitals:   10/15/19 1200 10/15/19 1638 10/15/19 2044 10/16/19 0615  BP: 102/75 125/65 124/63 120/60  Pulse: 76 75 76 70  Resp: 14 17 18 18   Temp: 98.8 F (37.1 C) 99 F (37.2 C) 99.1 F (37.3 C) 99 F (37.2 C)  TempSrc: Oral Oral Oral Oral  SpO2: 100% 97% 100% 100%  Weight:      Height:       Physical Exam General:chronically ill appearing obese, male in NAD Heart:RRR, +0/2 systolic murmur Lungs: Clear, bilaterally  Abdomen: soft, mild diffuse tenderness  Extremities:trace LE edema, L knee wrapped, L thigh wound dressed Dialysis Access: R fem [facility]   Filed Weights   10/13/19 0711 10/13/19 1155 10/14/19 2043  Weight: 117.1 kg 114.4 kg 116.6 kg    Intake/Output Summary (Last 24 hours) at 10/16/2019 0931 Last data filed at 10/16/2019 0851 Gross per 24 hour  Intake 720 ml  Output 1958 ml  Net -1238 ml    Additional Objective Labs: Basic Metabolic Panel: Recent Labs  Lab 10/10/19 0352 10/11/19 1209 10/14/19 0306 10/15/19 0256 10/16/19 0244  NA 135   < > 136 135 134*  K 5.0   < > 5.0 5.4* 5.6*  CL 98   < > 97* 97* 98  CO2 25   < > 25 26 27   GLUCOSE 108*   < > 91 86 93  BUN 69*   < > 51* 73* 57*  CREATININE 8.74*   < > 7.72* 9.69* 7.92*  CALCIUM 9.3   < > 9.5 9.4 9.3  PHOS 5.6*  --   --   --   --    < > = values in this interval not displayed.   Liver Function Tests: Recent Labs  Lab 10/14/19 0306 10/15/19 0256 10/16/19 0244  AST 10* 9* 10*  ALT 6 6 7   ALKPHOS 69 72 72  BILITOT 0.3 0.4 0.7  PROT 7.6 7.3 7.2  ALBUMIN 2.9* 2.9* 2.9*   CBC: Recent Labs  Lab 10/11/19 2230 10/11/19 2230 10/12/19 0500 10/12/19 0500 10/13/19 0143 10/13/19 0143 10/14/19 0306 10/15/19 0256 10/16/19 0244  WBC 5.0   < > 5.5   < > 5.9   <  > 5.4 5.1 5.3  NEUTROABS 3.5  --   --   --   --   --   --   --   --   HGB 9.2*   < > 8.7*   < > 8.5*   < > 8.8* 8.4* 8.8*  HCT 31.0*   < > 29.3*   < > 28.1*   < > 29.4* 28.6* 29.7*  MCV 98.4   < > 99.0  --  98.3  --  98.3 98.3 100.3*  PLT 181   < > 205   < > 193   < > 196 179 179   < > = values in this interval not displayed.   Blood Culture    Component Value Date/Time   SDES FLUID 10/07/2019 1517   SPECREQUEST LEFT KNEE JOINT SPEC A 10/07/2019 1517   CULT  10/07/2019 1517    No growth aerobically or anaerobically. Performed at [facility], [place] [address], [place], [place] [address]    REPTSTATUS 10/12/2019 FINAL 10/07/2019 1517  Studies/Results: DG Chest 2 View  Result Date: 10/14/2019 CLINICAL DATA:  Shortness of breath. EXAM: CHEST - 2 VIEW COMPARISON:  Chest radiograph 10/11/2019, 10/06/2019 FINDINGS: Stable cardiomediastinal contours. Central venous congestion without overt edema. Low lung volumes. A few scattered linear opacities left base likely reflect atelectasis. No pneumothorax or large pleural effusion. Diffuse demineralization of the bones. No acute osseous finding. IMPRESSION: Low volume study. Central venous congestion without overt edema. Minimal left basilar atelectasis. Electronically Signed   By: Audie Pinto M.D.   On: 10/14/2019 12:57    Medications: . cefTAZidime (FORTAZ)  IV Stopped (10/15/19 1341)  . vancomycin Stopped (10/15/19 1341)   . acetaminophen  1,000 mg Oral Q8H  . aspirin EC  325 mg Oral Daily  . calcium acetate  2,001 mg Oral With snacks  . calcium acetate  2,668 mg Oral TID with meals  . Chlorhexidine Gluconate Cloth  6 each Topical Q0600  . clopidogrel  75 mg Oral Daily  . darbepoetin (ARANESP) injection - DIALYSIS  200 mcg Intravenous Q Mon-HD  . doxercalciferol  3 mcg Intravenous Q M,W,F-HD  . famotidine  10 mg Oral Q M,W,F-2000  . heparin  5,000 Units Subcutaneous Q8H  . levothyroxine  137 mcg Oral QAC breakfast  .  lidocaine  2 patch Transdermal Daily  . psyllium  1 packet Oral Daily  . sodium zirconium cyclosilicate  10 g Oral QODAY    [facility] Optiflux 200NRe, Time: 4hr 23min, BFR 350, DFR 800, EDW 114.5kg, 2K/2.,25Ca, TDC heparin 3700 unit bolus Mircera 235mcg IV q 2 weeks- last dose 9/27 Venofer 100mg  q HD -last dose due today Phoslo 4 caps TO TID with meals and 3 with snacks Hectorol 3 mcg IV q HD Kayexalate 15g PO on non-dialysis days  Assessment/Plan: 1. ESRD:HD MWF. S/p [facility] manipulation by Dr. Trula Slade 10/2 as well as L thigh debridement with old AVG removal.TDC running better since intervention, giving cath flo post each HD. Access options limited. Next HD 10/15.  2. Hyperkalemia:Recurrent issue -pt takes kayexalate on non-dialysis days --changed to lokelma d/t issues with diarrhea. 3. Fevers. Blood cultures neg. Possible sources of infection include: dialysis catheter, left thigh wound, septic knee-- wound cultures neg. IV antibiotics per primary team.  Per ID continue IV Vanc/Fortaz TIW with HD until 11/2  4. L thigh wound/infected old AVG: S/p excision/debridement 10/2 -wet to dry dressings. Wound vac d/c due to dysfxn, may try again. Wont' be able to do dressings at home. Wound VAC replaced 10/12.  5. L knee pain/effusion - s/p joint aspiration 10/4. Concern for septic arthrits 25K WBCs insynovialfluid. To OR for I &D 10/5 --Culturesneg to date. 6. Hypertension/volume:BPlow/stable.Not getting to EDW, continue to UF as tolerated. CXR shows volume overload, lungs sound clear.  Reinforced fluid restrictions.  Now at outpatient dry weight.  7. Anemia:Hgb 8.8 . Aranesp 200 dosed 65/46.  8. Metabolic bone disease:Ca/Phos ok. Continue hectorol and phoslo. 9. Nutrition:Renal diet with 1.2L fluid restriction recommended. Prot supp for low alb.  10. L flank wound:D/t prior PCN tube - some erythema, no drainage - pt reports  this is improved, follow closely. 11. Diarrhea -D/c colace  12. Dispo -Pending   Lynnda Child PA-C [facility] 10/16/2019,9:31 AM

## 2019-10-16 NOTE — TOC Progression Note (Signed)
Transition of Care [facility]) - Progression Note    Patient Details  Name: [name] MRN: [id number] Date of Birth: [birth year]  Transition of Care [facility]) CM/SW Contact  Bartholomew Crews, RN Phone Number: [phone] 10/16/2019, 11:17 AM  Clinical Narrative:     Spoke with patient at the bedside to discuss transition plans.   Patient is home with his sister, but he states that she has her own medical issues and is limited in her ability to assist him.   Wound vac equipment was delivered to patient's hospital room on 10/4. Patient expressed concerns that something was missing based on comment from [facility] nurse about a "missing cartridge." Patient not sure he really understood what was said. Spoke with liaison at [place], [place], and reviewed all items in box. [name] confirmed all needed supplies were present.   Confirmed [place] arrangements with [facility]. [place] orders and Face to face updated for RN, PT, OT, Aide. Discussed patient needing day of discharge visit for wound vac.   Discussed need for bariatric walker per recommendations from PT. DME order requested. Patient is in need of heavy duty (bariatric) walker d/t his body habitus.   Patient expressed no concerns with getting medications from the pharmacy.   Patient uses [employer] to get order groceries.   Patient uses [facility] [place] to get to/from medical appointments.   Patient states that his sister will pick him up at discharge to provide transportation home.   TOC following for transition needs.   Expected Discharge Plan: [place] Barriers to Discharge: Continued Medical Work up  Expected Discharge Plan and Services Expected Discharge Plan: [place]   Discharge Planning Services: CM Consult Post Acute Care Choice: Home Health, Durable Medical Equipment                   DME Arranged: Vac DME Agency: [employer] Date DME Agency Contacted: 10/04/19 Time DME Agency Contacted: 0930 Representative spoke with  at DME Agency: [facility] Arranged: RN, Nurse's Aide [place] Agency: [facility] [place] Date [place]: 10/04/19 Time [place]: [id number] Representative spoke with at [place]: [place] Interventions    Readmission Risk Interventions No flowsheet data found.

## 2019-10-16 NOTE — Plan of Care (Signed)
  Problem: Education: Goal: Knowledge of General Education information will improve Description Including pain rating scale, medication(s)/side effects and non-pharmacologic comfort measures Outcome: Progressing   

## 2019-10-16 NOTE — Progress Notes (Signed)
Family Medicine Teaching Service Daily Progress Note Intern Pager: [phone]  Patient name: [name] Medical record number: [id number] Date of birth: 1965/07/17 Age: 54 y.o. Gender: male  Primary Care Provider: [name], MD Consultants: Nephrology, vascular surgery, orthopedics Code Status: FULL  Pt Overview and Major Events to Date:  10/1 Admitted, HD attempted (terminated early due to pain) 10/2Inferior venacavogram(right), I&D of left thigh with graft excision, HD 10/4 Joint aspiration left knee 10/5 Arthroscopic left knee irrigation and drainage with placement of antibiotic beads  Assessment and Plan: [name] is a 54 y.o. male presenting with inability to obtain access at his right femoral dialysis catheter while at HD, now with infected left thigh wound, presumed septic arthritis of left knee and atypical chest pain. PMH is significant for ESRD on HD (MWF), PAD, kidney stones, spina bifida, hx of nephrostomy tube, HTN, GERD, anxiety.  Infected left thigh wound Infected left thigh wound from old graft. S/p I&D of left thigh with graft excision 10/2. Wound culture growing few corynebacterium striatum. ID consulted, appreciate recommendations.  Wound VAC replaced, no issues. - antibiotics as below  Acute left knee pain Presumed left knee septic arthritis. S/p joint aspiration and arthroscopic left knee irrigation and drainage with placement of antibiotic beads. Cultures negative. OK to discharge from an orthopedics standpoint.  Overall improving. - oxycodone 10 mg q6 prn - scheduled Tylenol  Fever Resolved, being treated. Undergoing antibiotic treatment for corynebacterium striatum on left thigh wound culture. Blood cultures and synovial fluid cultures negative. Per ID, antibiotic plan: Vancomycin and ceftazidime MWF with dialysis for 4 weeks total course. Afebrile since 10/4.   - s/p piperacillin-tazobactam (10/4-10/6) - s/p CTX (10/6-10/7) - vancomycin  MWF with HD (10/4-) - ceftazidime MWF with HD (10/8-) - Tylenol prn  ESRD MWF dialysis, continue while inpatient per nephrology.  PAD At home on ASA 325 mg and clopidogrel. States he takes these so his dialysis catheter doesn't clot. - continue home meds  Atypical chest pain Right sided, worse with inspiration. Suspect MSK etiology given reproducible tenderness on exam, severe AS possibly contributing. EKGs have been unremarkable. No wall motion abnormalities on TTE.  Had second episode of exertional chest pain with PT yesterday.  Evaluated by cardiology, plan for gated cardiac CTA today. - gated cardiac CTA - f/u cardiology outpatient  Aortic stenosis Severe AS noted on echo this admission. - outpatient follow-up (sees Dr. Angelena Form)  Insomnia Takes zolpidem prn for sleep, trazodone prn for nightmares. - continue home meds  FEN/GI: renal diet PPx: [place]  Disposition: med-surg, home with home PT possibly today pending cardiac CTA results  Subjective:  This morning, patient expressing concern about episodes of exertional chest pain while working with PT.  He is not worrying about the knee much.  Feels anxious about going home without a cause for his chest pain.  Objective: Temp:  [98.8 F (37.1 C)-99.1 F (37.3 C)] 99 F (37.2 C) (10/14 0615) Pulse Rate:  [66-76] 70 (10/14 0615) Resp:  [12-18] 18 (10/14 0615) BP: (101-125)/(40-99) 120/60 (10/14 0615) SpO2:  [97 %-100 %] 100 % (10/14 0615) Physical Exam: General:Obese male, NAD Cardiovascular:RRR, harsh systolic murmur best heard at LUSB, sternal chest wall tenderness Respiratory:CTAB Abdomen:soft, mild left-sided tenderness, +BS Extremities:WWP, no edema, mild tenderness left knee Derm: left lower extremity dressings CDI, left flank wound with serosanguinous drainage, see image below:    Laboratory: Recent Labs  Lab 10/14/19 0306 10/15/19 0256 10/16/19 0244  WBC 5.4 5.1 5.3  HGB 8.8* 8.4* 8.8*  HCT  29.4* 28.6* 29.7*  PLT 196 179 179   Recent Labs  Lab 10/14/19 0306 10/15/19 0256 10/16/19 0244  NA 136 135 134*  K 5.0 5.4* 5.6*  CL 97* 97* 98  CO2 25 26 27   BUN 51* 73* 57*  CREATININE 7.72* 9.69* 7.92*  CALCIUM 9.5 9.4 9.3  PROT 7.6 7.3 7.2  BILITOT 0.3 0.4 0.7  ALKPHOS 69 72 72  ALT 6 6 7   AST 10* 9* 10*  GLUCOSE 91 86 93    Imaging/Diagnostic Tests: No new imaging.   Zola Button, MD 10/16/2019, 8:50 AM  PGY-1, [place] Intern pager: [phone], text pages welcome

## 2019-10-16 NOTE — Progress Notes (Signed)
PT Cancellation Note  Patient Details Name: [name] MRN: [id number] DOB: [birth year]   Cancelled Treatment:    Reason Eval/Treat Not Completed: Medical issues which prohibited therapy;Patient at procedure or test/unavailable Patient relates awaiting transport for chest CT due to cardiac symptoms last two times trying to do PT.  States has also new IV in L antecubital and unable to flex his elbow.  As discussing need for L LE extension at rest transport arrived to take pt to CT.  Will attempt another day.  Reginia Naas 10/16/2019, 12:53 PM  Magda Kiel, PT Acute Rehabilitation Services [phone] [phone] 10/16/2019

## 2019-10-16 NOTE — Progress Notes (Signed)
    Durable Medical Equipment  (From admission, onward)         Start     Ordered   10/16/19 1056  For home use only DME Walker rolling  Once       Comments: Needs bariatric walker  Question Answer Comment  Walker: With 5 Inch Wheels   Patient needs a walker to treat with the following condition Left knee pain      10/16/19 1056         Spoke with patient at the bedside to discuss DME needs for transition home. PT recommendations for heavy-duty or bariatric walker d/t patient body habitus.   Manya Silvas, RN CM  Transitions of Care - [place] [phone]

## 2019-10-16 NOTE — Progress Notes (Signed)
Progress Note  Patient Name: [name] Date of Encounter: 10/16/2019  [facility] Cardiologist: Lauree Chandler, MD   Subjective   No chest pain this am. No dyspnea  Inpatient Medications    Scheduled Meds: . acetaminophen  1,000 mg Oral Q8H  . aspirin EC  325 mg Oral Daily  . calcium acetate  2,001 mg Oral With snacks  . calcium acetate  2,668 mg Oral TID with meals  . Chlorhexidine Gluconate Cloth  6 each Topical Q0600  . clopidogrel  75 mg Oral Daily  . darbepoetin (ARANESP) injection - DIALYSIS  200 mcg Intravenous Q Mon-HD  . doxercalciferol  3 mcg Intravenous Q M,W,F-HD  . famotidine  10 mg Oral Q M,W,F-2000  . heparin  5,000 Units Subcutaneous Q8H  . levothyroxine  137 mcg Oral QAC breakfast  . lidocaine  2 patch Transdermal Daily  . psyllium  1 packet Oral Daily  . sodium zirconium cyclosilicate  10 g Oral QODAY   Continuous Infusions: . cefTAZidime (FORTAZ)  IV Stopped (10/15/19 1341)  . vancomycin Stopped (10/15/19 1341)   PRN Meds: Muscle Rub, [DISCONTINUED] oxyCODONE-acetaminophen **AND** oxyCODONE, promethazine, traZODone, zolpidem   Vital Signs    Vitals:   10/15/19 1638 10/15/19 2044 10/16/19 0615 10/16/19 0942  BP: 125/65 124/63 120/60 126/65  Pulse: 75 76 70 61  Resp: 17 18 18 18   Temp: 99 F (37.2 C) 99.1 F (37.3 C) 99 F (37.2 C) 98.5 F (36.9 C)  TempSrc: Oral Oral Oral Oral  SpO2: 97% 100% 100% 97%  Weight:      Height:        Intake/Output Summary (Last 24 hours) at 10/16/2019 1017 Last data filed at 10/16/2019 0851 Gross per 24 hour  Intake 720 ml  Output 1958 ml  Net -1238 ml   Last 3 Weights 10/14/2019 10/13/2019 10/13/2019  Weight (lbs) 257 lb 0.9 oz 252 lb 3.3 oz 258 lb 2.5 oz  Weight (kg) 116.6 kg 114.4 kg 117.1 kg      Telemetry    Sinus - Personally Reviewed  ECG    No AM EKG - Personally Reviewed  Physical Exam   General: Well developed, well nourished, NAD  HEENT: OP clear, mucus membranes  moist  SKIN: warm, dry. No rashes. Neuro: No focal deficits  Psychiatric: Mood and affect normal  Neck: No JVD  Lungs:Clear bilaterally, no wheezes, rhonci, crackles Cardiovascular: Regular rate and rhythm. Systolic murmur.  Abdomen:Soft. Bowel sounds present. Non-tender.  Extremities: No lower extremity edema.   Labs    High Sensitivity Troponin:   Recent Labs  Lab 10/14/19 1208 10/14/19 1406  TROPONINIHS 21* 19*      Chemistry Recent Labs  Lab 10/14/19 0306 10/15/19 0256 10/16/19 0244  NA 136 135 134*  K 5.0 5.4* 5.6*  CL 97* 97* 98  CO2 25 26 27   GLUCOSE 91 86 93  BUN 51* 73* 57*  CREATININE 7.72* 9.69* 7.92*  CALCIUM 9.5 9.4 9.3  PROT 7.6 7.3 7.2  ALBUMIN 2.9* 2.9* 2.9*  AST 10* 9* 10*  ALT 6 6 7   ALKPHOS 69 72 72  BILITOT 0.3 0.4 0.7  GFRNONAA 7* 5* 7*  ANIONGAP 14 12 9      Hematology Recent Labs  Lab 10/14/19 0306 10/15/19 0256 10/16/19 0244  WBC 5.4 5.1 5.3  RBC 2.99* 2.91* 2.96*  HGB 8.8* 8.4* 8.8*  HCT 29.4* 28.6* 29.7*  MCV 98.3 98.3 100.3*  MCH 29.4 28.9 29.7  MCHC 29.9* 29.4* 29.6*  RDW 17.6* 17.9* 18.2*  PLT 196 179 179    BNPNo results for input(s): BNP, PROBNP in the last 168 hours.   DDimer No results for input(s): DDIMER in the last 168 hours.   Radiology    DG Chest 2 View  Result Date: 10/14/2019 CLINICAL DATA:  Shortness of breath. EXAM: CHEST - 2 VIEW COMPARISON:  Chest radiograph 10/11/2019, 10/06/2019 FINDINGS: Stable cardiomediastinal contours. Central venous congestion without overt edema. Low lung volumes. A few scattered linear opacities left base likely reflect atelectasis. No pneumothorax or large pleural effusion. Diffuse demineralization of the bones. No acute osseous finding. IMPRESSION: Low volume study. Central venous congestion without overt edema. Minimal left basilar atelectasis. Electronically Signed   By: Audie Pinto M.D.   On: 10/14/2019 12:57    Cardiac Studies   Echo 10/05/19:  1. Left  ventricular ejection fraction, by estimation, is 65 to 70%. The  left ventricle has normal function. The left ventricle has no regional  wall motion abnormalities. There is mild left ventricular hypertrophy.  Left ventricular diastolic parameters  are consistent with Grade II diastolic dysfunction (pseudonormalization).  2. Right ventricular systolic function is normal. The right ventricular  size is normal. Tricuspid regurgitation signal is inadequate for assessing  PA pressure.  3. Left atrial size was moderately dilated.  4. The mitral valve is abnormal. Trivial mitral valve regurgitation.  Moderate mitral annular calcification.  5. The aortic valve is functionally bicuspid. There is moderate  calcification of the aortic valve and evidence of severe stenosis. Aortic  valve mean gradient measures 37.2 mmHg. Aortic valve Vmax measures 3.88  m/s. Dimentionless index 0.25  6. The inferior vena cava is normal in size with greater than 50%  respiratory variability, suggesting right atrial pressure of 3 mmHg.   Patient Profile     54 y.o. male with history of moderately severe to severe AS, bacterial endocarditis, HTN, ESRD on HD, GERD, seizure disorder, spina bifida with myelomeningocele, kidney stones, s/p stenting of left iliac artery in 2018 followed closely by Dr. Donzetta Matters in [facility],  and hypothyroidism who is seen for the evaluation of chest pain  Assessment & Plan    1. Aortic stenosis 2. Chest pain  He has no known CAD but his aortic stenosis is now near severe. He is having exertional chest pain. Risk factors for CAD include HTN and PAD. He also has ESRD and is on HD. His pain had many atypical features when I saw him yesterday. I cannot exclude obstructive CAD without further testing. It is also possible that some of his symptoms could be due to his aortic stenosis but he is a really poor candidate for consideration of aortic valve replacement given his open wound and episodes of  bacteremia. He is chronically anemic would could also be contributing to his symptoms.  -Gated cardiac CTA today to assess for obstructive CAD   For questions or updates, please contact [place] Please consult [email or web address] for contact info under        Signed, Lauree Chandler, MD  10/16/2019, 10:17 AM

## 2019-10-17 ENCOUNTER — Inpatient Hospital Stay (HOSPITAL_COMMUNITY)
Admission: EM | Disposition: A | Discharge status: Home Health | Payer: Self-pay | Source: Ambulatory Visit | Attending: Family Medicine

## 2019-10-17 ENCOUNTER — Inpatient Hospital Stay (HOSPITAL_COMMUNITY): Payer: Medicare Other

## 2019-10-17 DIAGNOSIS — I2584 Coronary atherosclerosis due to calcified coronary lesion: Secondary | ICD-10-CM

## 2019-10-17 DIAGNOSIS — I2510 Atherosclerotic heart disease of native coronary artery without angina pectoris: Secondary | ICD-10-CM

## 2019-10-17 DIAGNOSIS — R0789 Other chest pain: Secondary | ICD-10-CM

## 2019-10-17 DIAGNOSIS — I25110 Atherosclerotic heart disease of native coronary artery with unstable angina pectoris: Secondary | ICD-10-CM

## 2019-10-17 HISTORY — PX: LEFT HEART CATH AND CORONARY ANGIOGRAPHY: CATH118249

## 2019-10-17 LAB — RENAL FUNCTION PANEL
Albumin: 3 g/dL — ABNORMAL LOW (ref 3.5–5.0)
Anion gap: 15 (ref 5–15)
BUN: 78 mg/dL — ABNORMAL HIGH (ref 6–20)
CO2: 22 mmol/L (ref 22–32)
Calcium: 9.3 mg/dL (ref 8.9–10.3)
Chloride: 93 mmol/L — ABNORMAL LOW (ref 98–111)
Creatinine, Ser: 10.15 mg/dL — ABNORMAL HIGH (ref 0.61–1.24)
GFR, Estimated: 5 mL/min — ABNORMAL LOW (ref >60)
Glucose, Bld: 85 mg/dL (ref 70–99)
Phosphorus: 7.5 mg/dL — ABNORMAL HIGH (ref 2.5–4.6)
Potassium: 6 mmol/L — ABNORMAL HIGH (ref 3.5–5.1)
Sodium: 130 mmol/L — ABNORMAL LOW (ref 135–145)

## 2019-10-17 LAB — CBC
HCT: 30.2 % — ABNORMAL LOW (ref 39.0–52.0)
Hemoglobin: 8.7 g/dL — ABNORMAL LOW (ref 13.0–17.0)
MCH: 28.7 pg (ref 26.0–34.0)
MCHC: 28.8 g/dL — ABNORMAL LOW (ref 30.0–36.0)
MCV: 99.7 fL (ref 80.0–100.0)
Platelets: 176 10*3/uL (ref 150–400)
RBC: 3.03 MIL/uL — ABNORMAL LOW (ref 4.22–5.81)
RDW: 18.4 % — ABNORMAL HIGH (ref 11.5–15.5)
WBC: 5.3 10*3/uL (ref 4.0–10.5)
nRBC: 0 % (ref 0.0–0.2)

## 2019-10-17 SURGERY — LEFT HEART CATH AND CORONARY ANGIOGRAPHY
Anesthesia: LOCAL

## 2019-10-17 MED ORDER — LIDOCAINE HCL (PF) 1 % IJ SOLN
INTRAMUSCULAR | Status: AC
  Filled 2019-10-17: qty 30

## 2019-10-17 MED ORDER — ALTEPLASE 2 MG IJ SOLR
INTRAMUSCULAR | Status: AC
  Administered 2019-10-17: 4 mg
  Filled 2019-10-17: qty 4

## 2019-10-17 MED ORDER — MIDAZOLAM HCL 2 MG/2ML IJ SOLN
INTRAMUSCULAR | Status: DC | PRN
  Administered 2019-10-17: 1 mg via INTRAVENOUS

## 2019-10-17 MED ORDER — VANCOMYCIN HCL IN DEXTROSE 1-5 GM/200ML-% IV SOLN
INTRAVENOUS | Status: AC
  Filled 2019-10-17: qty 200

## 2019-10-17 MED ORDER — LIDOCAINE-PRILOCAINE 2.5-2.5 % EX CREA: 1.0000 "application " | TOPICAL_CREAM | CUTANEOUS | Status: DC | PRN

## 2019-10-17 MED ORDER — LIDOCAINE HCL (PF) 1 % IJ SOLN: 5.0000 mL | INTRAMUSCULAR | Status: DC | PRN

## 2019-10-17 MED ORDER — SODIUM CHLORIDE 0.9 % IV SOLN: 100.0000 mL | INTRAVENOUS | Status: DC | PRN

## 2019-10-17 MED ORDER — PROMETHAZINE HCL 25 MG/ML IJ SOLN
INTRAMUSCULAR | Status: AC
  Filled 2019-10-17: qty 1

## 2019-10-17 MED ORDER — OXYCODONE HCL 5 MG PO TABS
ORAL_TABLET | ORAL | Status: AC
  Filled 2019-10-17: qty 2

## 2019-10-17 MED ORDER — HEPARIN SODIUM (PORCINE) 1000 UNIT/ML DIALYSIS: 3500.0000 [IU] | Freq: Once | INTRAMUSCULAR | Status: DC

## 2019-10-17 MED ORDER — FENTANYL CITRATE (PF) 100 MCG/2ML IJ SOLN
INTRAMUSCULAR | Status: AC
  Filled 2019-10-17: qty 2

## 2019-10-17 MED ORDER — HEPARIN (PORCINE) IN NACL 1000-0.9 UT/500ML-% IV SOLN
INTRAVENOUS | Status: AC
  Filled 2019-10-17: qty 1000

## 2019-10-17 MED ORDER — SODIUM CHLORIDE 0.9 % IV SOLN: 250.0000 mL | INTRAVENOUS | Status: DC | PRN

## 2019-10-17 MED ORDER — MIDAZOLAM HCL 2 MG/2ML IJ SOLN
INTRAMUSCULAR | Status: AC
  Filled 2019-10-17: qty 2

## 2019-10-17 MED ORDER — ONDANSETRON HCL 4 MG/2ML IJ SOLN: 4.0000 mg | Freq: Four times a day (QID) | INTRAMUSCULAR | Status: DC | PRN

## 2019-10-17 MED ORDER — ALTEPLASE 2 MG IJ SOLR: 2.0000 mg | Freq: Once | INTRAMUSCULAR | Status: DC | PRN

## 2019-10-17 MED ORDER — PENTAFLUOROPROP-TETRAFLUOROETH EX AERO: 1.0000 "application " | INHALATION_SPRAY | CUTANEOUS | Status: DC | PRN

## 2019-10-17 MED ORDER — IOHEXOL 350 MG/ML SOLN
INTRAVENOUS | Status: DC | PRN
  Administered 2019-10-17: 60 mL

## 2019-10-17 MED ORDER — HYDRALAZINE HCL 20 MG/ML IJ SOLN: 10.0000 mg | INTRAMUSCULAR | Status: AC | PRN

## 2019-10-17 MED ORDER — DOXERCALCIFEROL 4 MCG/2ML IV SOLN
INTRAVENOUS | Status: AC
  Administered 2019-10-17: 3 ug
  Filled 2019-10-17: qty 2

## 2019-10-17 MED ORDER — HEPARIN SODIUM (PORCINE) 1000 UNIT/ML DIALYSIS: 1000.0000 [IU] | INTRAMUSCULAR | Status: DC | PRN

## 2019-10-17 MED ORDER — LEVOTHYROXINE SODIUM 25 MCG PO TABS
137.0000 ug | ORAL_TABLET | Freq: Every day | ORAL | Status: DC
  Administered 2019-10-18: 137 ug via ORAL
  Filled 2019-10-17: qty 1

## 2019-10-17 MED ORDER — SODIUM CHLORIDE 0.9% FLUSH: 3.0000 mL | INTRAVENOUS | Status: DC | PRN

## 2019-10-17 MED ORDER — PROMETHAZINE HCL 25 MG/ML IJ SOLN
12.5000 mg | Freq: Once | INTRAMUSCULAR | Status: AC
  Administered 2019-10-17: 12.5 mg via INTRAVENOUS

## 2019-10-17 MED ORDER — HEPARIN SODIUM (PORCINE) 1000 UNIT/ML IJ SOLN
INTRAMUSCULAR | Status: AC
  Administered 2019-10-17: 1000 [IU]
  Filled 2019-10-17: qty 3

## 2019-10-17 MED ORDER — FENTANYL CITRATE (PF) 100 MCG/2ML IJ SOLN
25.0000 ug | Freq: Once | INTRAMUSCULAR | Status: AC
  Administered 2019-10-17: 25 ug via INTRAVENOUS

## 2019-10-17 MED ORDER — LABETALOL HCL 5 MG/ML IV SOLN: 10.0000 mg | INTRAVENOUS | Status: AC | PRN

## 2019-10-17 MED ORDER — HEPARIN SODIUM (PORCINE) 1000 UNIT/ML DIALYSIS
3000.0000 [IU] | Freq: Once | INTRAMUSCULAR | Status: AC
  Administered 2019-10-17: 3000 [IU] via INTRAVENOUS_CENTRAL

## 2019-10-17 MED ORDER — HEPARIN SODIUM (PORCINE) 1000 UNIT/ML DIALYSIS: 3000.0000 [IU] | INTRAMUSCULAR | Status: DC | PRN

## 2019-10-17 MED ORDER — FENTANYL CITRATE (PF) 100 MCG/2ML IJ SOLN
INTRAMUSCULAR | Status: DC | PRN
Start: 2019-10-17 — End: 2019-10-17
  Administered 2019-10-17 (×2): 25 ug via INTRAVENOUS

## 2019-10-17 MED ORDER — SODIUM CHLORIDE 0.9% FLUSH
3.0000 mL | Freq: Two times a day (BID) | INTRAVENOUS | Status: DC
  Administered 2019-10-17 – 2019-10-18 (×2): 3 mL via INTRAVENOUS

## 2019-10-17 MED ORDER — ACETAMINOPHEN 325 MG PO TABS: 650.0000 mg | ORAL_TABLET | ORAL | Status: DC | PRN

## 2019-10-17 MED ORDER — HEPARIN (PORCINE) IN NACL 1000-0.9 UT/500ML-% IV SOLN
INTRAVENOUS | Status: DC | PRN
  Administered 2019-10-17 (×2): 500 mL

## 2019-10-17 MED ORDER — HEPARIN SODIUM (PORCINE) 1000 UNIT/ML DIALYSIS: 3000.0000 [IU] | Freq: Once | INTRAMUSCULAR | Status: DC

## 2019-10-17 MED ORDER — LIDOCAINE HCL (PF) 1 % IJ SOLN
INTRAMUSCULAR | Status: DC | PRN
  Administered 2019-10-17: 10 mL
  Administered 2019-10-17: 15 mL

## 2019-10-17 SURGICAL SUPPLY — 11 items
CATH INFINITI 5FR MULTPACK ANG (CATHETERS) ×1 IMPLANT
CATH SWAN GANZ 7F STRAIGHT (CATHETERS) ×1 IMPLANT
KIT HEART LEFT (KITS) ×2 IMPLANT
PACK CARDIAC CATHETERIZATION (CUSTOM PROCEDURE TRAY) ×2 IMPLANT
SHEATH PINNACLE 5F 10CM (SHEATH) ×1 IMPLANT
SHEATH PINNACLE 7F 10CM (SHEATH) ×1 IMPLANT
SHEATH PROBE COVER 6X72 (BAG) ×1 IMPLANT
TRANSDUCER W/STOPCOCK (MISCELLANEOUS) ×2 IMPLANT
TUBING CIL FLEX 10 FLL-RA (TUBING) ×2 IMPLANT
WIRE EMERALD 3MM-J .035X150CM (WIRE) ×1 IMPLANT
WIRE EMERALD ST .035X150CM (WIRE) ×1 IMPLANT

## 2019-10-17 NOTE — Progress Notes (Signed)
Pharmacy Antibiotic Note  [name] is a 54 y.o. male admitted on 10/03/2019 with issues with functionality of HD cath.  Overnight 10/3>10/4 developed new fever and knee swelling--found to have septic arthritis now s/p knee arthroscopy and antibiotic bead placement. Pharmacy has been consulted for vancomycin dosing.  Per ID, plan is to continue vancomycin 1000 mg IV and ceftazidime 2 g IV every MWF with dialysis until 11/04/19. Pre-HD vancomycin level on 10/10/19 was at goal of 15-25 mcg/mL. Pt continues to be afebrile, WBC wnl. Pt has not been able to tolerate blood flow rates above 200-300 mL/min during HD sessions, but this was the case prior to last vancomycin level as well.   Plan: Continue vancomycin 1000 mg IV every MWF with dialysis until 11/04/19 Continue ceftazidime 2 g IV every MWF with dialysis until 11/04/19 Check vancomycin trough Monday AM prior to HD  Height: 5\' 11"  (180.3 cm) Weight: 117.7 kg (259 lb 7.7 oz) IBW/kg (Calculated) : 75.3  Temp (24hrs), Avg:98.4 F (36.9 C), Min:97.8 F (36.6 C), Max:99 F (37.2 C)  Recent Labs  Lab 10/13/19 0143 10/14/19 0306 10/15/19 0256 10/16/19 0244 10/17/19 0719 10/17/19 0725  WBC 5.9 5.4 5.1 5.3 5.3  --   CREATININE 10.70* 7.72* 9.69* 7.92*  --  10.15*    Estimated Creatinine Clearance: 10.9 mL/min (A) (by C-G formula based on SCr of 10.15 mg/dL (H)).    Allergies  Allergen Reactions  . Hydrocodone Other (See Comments)    Caused involuntary movement and twitching. CANNOT TAKE DUE TO MUSCLE SPASMS AND MUSCLE TREMORS  . Colace [Docusate] Diarrhea  . Furadantin [Nitrofurantoin] Other (See Comments)    UNSPECIFIED REACTION   . Mandelamine [Methenamine] Other (See Comments)    UNSPECIFIED REACTION   . Noroxin [Norfloxacin] Other (See Comments)    UNSPECIFIED REACTION   . Sulfa Antibiotics Other (See Comments) and Cough    Childhood reaction - pt could not confirm that it was a cough  . Sulfur Cough    Childhood reaction  - pt could not confirm that it was a cough    Antimicrobials this admission: Vancomycin 10/4 >>  Zosyn 10/4 >>10/5 Ceftriaxone 10/6 >> 10/7 Ceftazidime 10/7 >>  Thank you for allowing pharmacy to be a part of this patient's care.  Rebbeca Paul, PharmD PGY1 Pharmacy Resident 10/17/2019 9:41 AM  Please check [email or web address] for unit-specific pharmacy phone numbers.

## 2019-10-17 NOTE — Progress Notes (Signed)
Cardiology PM Rounding Note:  Cardiac cath with minimal disease. The cardiac CT demonstrated calcification which made it difficult to assess for stenosis. I have reviewed this with him. I think he should be watched tonight post cath with discharge home tomorrow. I will plan to see him back in my office in 1-2 months. I will continue to follow his aortic stenosis for now, allowing the left thigh wound to heal.   Lauree Chandler 10/17/2019 4:36 PM

## 2019-10-17 NOTE — Plan of Care (Signed)
  Problem: Education: Goal: Knowledge of General Education information will improve Description Including pain rating scale, medication(s)/side effects and non-pharmacologic comfort measures Outcome: Progressing   

## 2019-10-17 NOTE — Interval H&P Note (Signed)
Cath Lab Visit (complete for each Cath Lab visit)  Clinical Evaluation Leading to the Procedure:   ACS: Yes.    Non-ACS:    Anginal Classification: CCS IV  Anti-ischemic medical therapy: Minimal Therapy (1 class of medications)  Non-Invasive Test Results: No non-invasive testing performed  Prior CABG: No previous CABG      History and Physical Interval Note:  10/17/2019 12:55 PM  [name]  has presented today for surgery, with the diagnosis of Aortic stenosis.  The various methods of treatment have been discussed with the patient and family. After consideration of risks, benefits and other options for treatment, the patient has consented to  Procedure(s): RIGHT/LEFT HEART CATH AND CORONARY ANGIOGRAPHY (N/A) as a surgical intervention.  The patient's history has been reviewed, patient examined, no change in status, stable for surgery.  I have reviewed the patient's chart and labs.  Questions were answered to the patient's satisfaction.     Larae Grooms

## 2019-10-17 NOTE — Progress Notes (Signed)
Progress Note  Patient Name: [name] Date of Encounter: 10/17/2019  [facility] Cardiologist: Lauree Chandler, MD   Subjective   No chest pain this am.   Inpatient Medications    Scheduled Meds: . acetaminophen  1,000 mg Oral Q8H  . aspirin EC  325 mg Oral Daily  . calcium acetate  2,001 mg Oral With snacks  . calcium acetate  2,668 mg Oral TID with meals  . Chlorhexidine Gluconate Cloth  6 each Topical Q0600  . clopidogrel  75 mg Oral Daily  . darbepoetin (ARANESP) injection - DIALYSIS  200 mcg Intravenous Q Mon-HD  . doxercalciferol  3 mcg Intravenous Q M,W,F-HD  . escitalopram  5 mg Oral Daily  . famotidine  10 mg Oral Q M,W,F-2000  . heparin  3,000 Units Dialysis Once in dialysis  . [START ON 10/18/2019] heparin  3,500 Units Dialysis Once in dialysis  . heparin  5,000 Units Subcutaneous Q8H  . levothyroxine  137 mcg Oral QAC breakfast  . lidocaine  2 patch Transdermal Daily  . psyllium  1 packet Oral Daily  . sodium zirconium cyclosilicate  10 g Oral QODAY   Continuous Infusions: . sodium chloride    . sodium chloride    . cefTAZidime (FORTAZ)  IV Stopped (10/15/19 1341)  . vancomycin Stopped (10/15/19 1341)   PRN Meds: sodium chloride, sodium chloride, [START ON 10/18/2019] alteplase, heparin, hydrOXYzine, [START ON 10/18/2019] lidocaine (PF), [START ON 10/18/2019] lidocaine-prilocaine, Muscle Rub, [DISCONTINUED] oxyCODONE-acetaminophen **AND** oxyCODONE, pentafluoroprop-tetrafluoroeth, promethazine, traZODone, zolpidem   Vital Signs    Vitals:   10/17/19 0615 10/17/19 0702 10/17/19 0707 10/17/19 0715  BP: (!) 116/59 (!) 143/76 137/71 (!) 113/57  Pulse: (!) 58 60 62 (!) 57  Resp: 17 18 17 16   Temp: 97.8 F (36.6 C) 98.7 F (37.1 C)    TempSrc: Oral Oral    SpO2: 100% 100%    Weight:   117.7 kg   Height:        Intake/Output Summary (Last 24 hours) at 10/17/2019 0753 Last data filed at 10/17/2019 0600 Gross per 24 hour  Intake 1020  ml  Output 50 ml  Net 970 ml   Last 3 Weights 10/17/2019 10/16/2019 10/14/2019  Weight (lbs) 259 lb 7.7 oz 260 lb 5.8 oz 257 lb 0.9 oz  Weight (kg) 117.7 kg 118.1 kg 116.6 kg      Telemetry    Sinus - Personally Reviewed  ECG    No AM EKG - Personally Reviewed  Physical Exam    General: Well developed, well nourished, NAD  Neuro: No focal deficits Psychiatric: Mood and affect normal  Neck: No JVD Lungs:Clear bilaterally, no wheezes, rhonci, crackles Cardiovascular: Regular rate and rhythm. Systolic murmur.  Abdomen:Soft. Bowel sounds present. Non-tender.  Extremities: No lower extremity edema.    Labs    High Sensitivity Troponin:   Recent Labs  Lab 10/14/19 1208 10/14/19 1406  TROPONINIHS 21* 19*      Chemistry Recent Labs  Lab 10/14/19 0306 10/15/19 0256 10/16/19 0244  NA 136 135 134*  K 5.0 5.4* 5.6*  CL 97* 97* 98  CO2 25 26 27   GLUCOSE 91 86 93  BUN 51* 73* 57*  CREATININE 7.72* 9.69* 7.92*  CALCIUM 9.5 9.4 9.3  PROT 7.6 7.3 7.2  ALBUMIN 2.9* 2.9* 2.9*  AST 10* 9* 10*  ALT 6 6 7   ALKPHOS 69 72 72  BILITOT 0.3 0.4 0.7  GFRNONAA 7* 5* 7*  ANIONGAP 14 12 9  Hematology Recent Labs  Lab 10/15/19 0256 10/16/19 0244 10/17/19 0719  WBC 5.1 5.3 5.3  RBC 2.91* 2.96* 3.03*  HGB 8.4* 8.8* 8.7*  HCT 28.6* 29.7* 30.2*  MCV 98.3 100.3* 99.7  MCH 28.9 29.7 28.7  MCHC 29.4* 29.6* 28.8*  RDW 17.9* 18.2* 18.4*  PLT 179 179 176    BNPNo results for input(s): BNP, PROBNP in the last 168 hours.   DDimer No results for input(s): DDIMER in the last 168 hours.   Radiology    CT CORONARY MORPH W/CTA COR W/SCORE W/CA W/CM &/OR WO/CM  Addendum Date: 10/16/2019   ADDENDUM REPORT: 10/16/2019 17:10 CLINICAL DATA:  54 year old male with aortic stenosis and ESRD. EXAM: Cardiac/Coronary  CTA TECHNIQUE: The patient was scanned on a [employer]. FINDINGS: A 100 kV prospective scan was triggered in the descending thoracic aorta at 111 HU's.  Axial non-contrast 3 mm slices were carried out through the heart. The data set was analyzed on a dedicated work station and scored using the [place]. Gantry rotation speed was 250 msecs and collimation was .6 mm. No beta blockade and 0.8 mg of sl NTG was given. The 3D data set was reconstructed in 5% intervals of the 67-82 % of the R-R cycle. Diastolic phases were analyzed on a dedicated work station using MPR, MIP and VRT modes. The patient received 80 cc of contrast. Aorta:  Normal size.  Mild atherosclerotic plaque. No dissection. Aortic Valve: Trileaflet with severely calcified leaflets and severely restricted leaflet opening. Aortic valve calcium score 2882 consistent with severe aortic stenosis. Coronary Arteries:  Normal coronary origin.  Right dominance. RCA is a large dominant artery that gives rise to PDA and PLA. Proximal RCA has severe mixed, predominantly calcified plaque with stenosis at least 50-69%. Mid RCA has diffuse mild plaque. Distal RCA has severe mixed diffuse plaque that is possibly > 70%. PDA and PLA are not interpretable. Left main is a large artery that gives rise to LAD, ramus intermedius and LCX arteries. Left main has minimal plaque. LAD is a large vessel that gives rise to one diagonal artery. Proximal LAD has minimal plaque. Mid LAD has a long segment of severe calcified plaque with stenosis at least 50-69%, but possible > 70%. Distal LAD has only mild plaque. Ramus intermedius is a small lumen artery with mild diffuse plaque. LCX is a small lumen non-dominant artery that gives rise to one small OM1 branch. There is at least moderate calcified plaque in the proximal LCX artery with stenosis 50-69%. OM1 is rather small artery with at least moderate calcified plaque with stenosis 50-69%. Other findings: Normal pulmonary vein drainage into the left atrium. Normal left atrial appendage without a thrombus. Normal size of the pulmonary artery. IMPRESSION: 1. Coronary calcium score  of 4158. This was 68 percentile for age and sex matched control. 2. Normal coronary origin with right dominance. 3. Very challenging study, interpretation effected by arrhythmia during acquisition, patient's size (BMI 36) and severe calcifications. There is heavy diffuse predominantly calcified plaque and at least moderate stenosis in the mid LAD, proximal and distal RCA. Cardiac catheterization or CT FFR is recommended. Consider symptom-guided anti-ischemic pharmacotherapy as well as risk factor modification per guideline directed care. 4. Trileaflet aortic valve with severely calcified leaflets and severely restricted leaflet opening. Aortic valve calcium score 2882 consistent with severe aortic stenosis. 5. Severe extensive mitral annular calcifications. Electronically Signed   By: Ena Dawley   On: 10/16/2019 17:10   Result Date: 10/16/2019  EXAM: OVER-READ INTERPRETATION  CT CHEST The following report is an over-read performed by radiologist Dr. Vinnie Langton of [facility], [place] on 10/16/2019. This over-read does not include interpretation of cardiac or coronary anatomy or pathology. The coronary calcium score/coronary CTA interpretation by the cardiologist is attached. COMPARISON:  None. FINDINGS: Within the visualized portions of the thorax there are no suspicious appearing pulmonary nodules or masses, there is no acute consolidative airspace disease, no pleural effusions, no pneumothorax and no lymphadenopathy. Visualized portions of the upper abdomen are unremarkable. There are no aggressive appearing lytic or blastic lesions noted in the visualized portions of the skeleton. IMPRESSION: No significant incidental noncardiac findings are noted. Electronically Signed: By: Vinnie Langton M.D. On: 10/16/2019 13:30    Cardiac Studies   Echo 10/05/19:  1. Left ventricular ejection fraction, by estimation, is 65 to 70%. The  left ventricle has normal function. The left ventricle has no  regional  wall motion abnormalities. There is mild left ventricular hypertrophy.  Left ventricular diastolic parameters  are consistent with Grade II diastolic dysfunction (pseudonormalization).  2. Right ventricular systolic function is normal. The right ventricular  size is normal. Tricuspid regurgitation signal is inadequate for assessing  PA pressure.  3. Left atrial size was moderately dilated.  4. The mitral valve is abnormal. Trivial mitral valve regurgitation.  Moderate mitral annular calcification.  5. The aortic valve is functionally bicuspid. There is moderate  calcification of the aortic valve and evidence of severe stenosis. Aortic  valve mean gradient measures 37.2 mmHg. Aortic valve Vmax measures 3.88  m/s. Dimentionless index 0.25  6. The inferior vena cava is normal in size with greater than 50%  respiratory variability, suggesting right atrial pressure of 3 mmHg.   Patient Profile     54 y.o. male with history of moderately severe to severe AS, bacterial endocarditis, HTN, ESRD on HD, GERD, seizure disorder, spina bifida with myelomeningocele, kidney stones, s/p stenting of left iliac artery in 2018 followed closely by Dr. Donzetta Matters in [facility],  and hypothyroidism who is seen for the evaluation of chest pain  Assessment & Plan    1. CAD with unstable angina: He has had exertional chest pain. Gated coronary CTA yesterday with heavily calcified, three vessel CAD but difficult to define severity due to calcification. Will need a cardiac cath to better define his CAD. He will likely end up needing surgical AVR and CABG but he will be a marginal candidate for this given his open wound on his left thigh with the wound vac in place. His access for the cardiac cath is problematic. I think the right femoral artery can be used. The right radial, left radial and left femoral are not options given prior dialysis access/grafts.    I have reviewed the risks, indications, and alternatives to  cardiac catheterization, possible angioplasty, and stenting with the patient. Risks include but are not limited to bleeding, infection, vascular injury, stroke, myocardial infection, arrhythmia, kidney injury, radiation-related injury in the case of prolonged fluoroscopy use, emergency cardiac surgery, and death. The patient understands the risks of serious complication is 1-2 in 4401 with diagnostic cardiac cath and 1-2% or less with angioplasty/stenting.  2. Severe aortic stenosis: His aortic stenosis is now severe. Calcium score 2882 on CT scan. Mean gradient 37 mmHg with dimensionless index of 0.25.    For questions or updates, please contact [place] Please consult [email or web address] for contact info under        Signed, Lauree Chandler, MD  10/17/2019, 7:53 AM

## 2019-10-17 NOTE — Progress Notes (Addendum)
Subjective:  Seen on hd , tolerating uf gl 2700, no sob or cp,noted for Card cath today  eval unstable angina =had  "gated  Coronary CTA  10/14 3 V CAD"  Objective Vital signs in last 24 hours: Vitals:   10/17/19 0845 10/17/19 0915 10/17/19 0945 10/17/19 1015  BP: (!) 114/58 (!) 110/58 (!) 92/43 (!) 83/53  Pulse: 61 64 61 60  Resp: 15 16    Temp:      TempSrc:      SpO2: 99%     Weight:      Height:       Weight change:   Physical Exam: General:Alert on hd chronically ill appearing obese, male NAD Heart:RRR, +0/5 systolic murmur Lungs: Clear, bilaterally  Abdomen: soft, Mild L side tenderness , no rebound  Extremities:trace LE edema, L knee wrapped, L thigh wound dressed Dialysis Access: R fem TDC patent on hd bfr [id number]   [facility] Optiflux 200NRe, Time: 4hr 67min, BFR 350, DFR 800, EDW 114.5kg, 2K/2.,25Ca, TDC heparin 3700 unit bolus Mircera 228mcg IV q 2 weeks- last dose 9/27 Venofer 100mg  q HD -last dose due today Phoslo 4 caps TO TID with meals and 3 with snacks Hectorol 3 mcg IV q HD Kayexalate 15g PO on non-dialysis days OP   Problem/Plan: 1.  ESRD:HD MWF. S/p [facility] manipulation by Dr. Trula Slade 10/2 as well as L thigh debridement with old AVG removal.TDC improved on HD  since intervention, giving cath flo post each HD. Access options limited.Next HD 10/15.  2. Hyperkalemia:Recurrent issue -pt takes kayexalate on non-dialysis days --changed to lokelma d/t issues with diarrhea. 3. Unstable angia /CAD =Card cath today  eval unstable angina =had  "gated  Coronary CTA  10/14 3 V CAD" 4. L thigh wound/infected old AVG: S/p excision/debridement 10/2-  Wound culture growing few corynebacterium striatum. ID consulted,  Per ID continue IV Vanc/Fortaz TIW with HD until 11/2  5. L knee pain/effusion - s/p joint aspiration 10/4. Concern for septic arthrits 25K WBCs insynovialfluid. To OR for I &D 10/5 --Culturesneg to  date. 6. Hypertension/volume:BPlow/stable.Not getting to EDW, 2l  UF today tolerated. CXR shows volume overload, lungs sound clear.  Reinforced fluid restrictions.  Now at outpatient dry weight.  7. Anemia:Hgb 8.7. Aranesp 200 dosed 39/76.  8. Metabolic bone disease:Ca sl ^ /Phos 7.5  Continue hectorol and phoslo. 9. Nutrition:Renal diet with 1.2L fluid restriction recommended. Prot supp for low alb.  10. L flank wound:D/t prior PCN tube - some erythema, no drainage - pt reports this is improved, follow closely. 11. Diarrhea  Improved -D/ccolace and chronic Kayexalate use as op   Ernest Haber, PA-C [facility] Beeper [phone] 10/17/2019,11:30 AM  LOS: 13 days   Labs: Basic Metabolic Panel: Recent Labs  Lab 10/15/19 0256 10/16/19 0244 10/17/19 0725  NA 135 134* 130*  K 5.4* 5.6* 6.0*  CL 97* 98 93*  CO2 26 27 22   GLUCOSE 86 93 85  BUN 73* 57* 78*  CREATININE 9.69* 7.92* 10.15*  CALCIUM 9.4 9.3 9.3  PHOS  --   --  7.5*   Liver Function Tests: Recent Labs  Lab 10/14/19 0306 10/14/19 0306 10/15/19 0256 10/16/19 0244 10/17/19 0725  AST 10*  --  9* 10*  --   ALT 6  --  6 7  --   ALKPHOS 69  --  72 72  --   BILITOT 0.3  --  0.4 0.7  --  PROT 7.6  --  7.3 7.2  --   ALBUMIN 2.9*   < > 2.9* 2.9* 3.0*   < > = values in this interval not displayed.   No results for input(s): LIPASE, AMYLASE in the last 168 hours. No results for input(s): AMMONIA in the last 168 hours. CBC: Recent Labs  Lab 10/11/19 2230 10/12/19 0500 10/13/19 0143 10/13/19 0143 10/14/19 0306 10/14/19 0306 10/15/19 0256 10/16/19 0244 10/17/19 0719  WBC 5.0   < > 5.9   < > 5.4   < > 5.1 5.3 5.3  NEUTROABS 3.5  --   --   --   --   --   --   --   --   HGB 9.2*   < > 8.5*   < > 8.8*   < > 8.4* 8.8* 8.7*  HCT 31.0*   < > 28.1*   < > 29.4*   < > 28.6* 29.7* 30.2*  MCV 98.4   < > 98.3  --  98.3  --  98.3 100.3* 99.7  PLT 181   < > 193   < > 196   < > 179 179 176   < > =  values in this interval not displayed.   Cardiac Enzymes: No results for input(s): CKTOTAL, CKMB, CKMBINDEX, TROPONINI in the last 168 hours. CBG: No results for input(s): GLUCAP in the last 168 hours.  Studies/Results: CT CORONARY MORPH W/CTA COR W/SCORE W/CA W/CM &/OR WO/CM  Addendum Date: 10/16/2019   ADDENDUM REPORT: 10/16/2019 17:10 CLINICAL DATA:  54 year old male with aortic stenosis and ESRD. EXAM: Cardiac/Coronary  CTA TECHNIQUE: The patient was scanned on a [employer]. FINDINGS: A 100 kV prospective scan was triggered in the descending thoracic aorta at 111 HU's. Axial non-contrast 3 mm slices were carried out through the heart. The data set was analyzed on a dedicated work station and scored using the [place]. Gantry rotation speed was 250 msecs and collimation was .6 mm. No beta blockade and 0.8 mg of sl NTG was given. The 3D data set was reconstructed in 5% intervals of the 67-82 % of the R-R cycle. Diastolic phases were analyzed on a dedicated work station using MPR, MIP and VRT modes. The patient received 80 cc of contrast. Aorta:  Normal size.  Mild atherosclerotic plaque. No dissection. Aortic Valve: Trileaflet with severely calcified leaflets and severely restricted leaflet opening. Aortic valve calcium score 2882 consistent with severe aortic stenosis. Coronary Arteries:  Normal coronary origin.  Right dominance. RCA is a large dominant artery that gives rise to PDA and PLA. Proximal RCA has severe mixed, predominantly calcified plaque with stenosis at least 50-69%. Mid RCA has diffuse mild plaque. Distal RCA has severe mixed diffuse plaque that is possibly > 70%. PDA and PLA are not interpretable. Left main is a large artery that gives rise to LAD, ramus intermedius and LCX arteries. Left main has minimal plaque. LAD is a large vessel that gives rise to one diagonal artery. Proximal LAD has minimal plaque. Mid LAD has a long segment of severe calcified plaque with  stenosis at least 50-69%, but possible > 70%. Distal LAD has only mild plaque. Ramus intermedius is a small lumen artery with mild diffuse plaque. LCX is a small lumen non-dominant artery that gives rise to one small OM1 branch. There is at least moderate calcified plaque in the proximal LCX artery with stenosis 50-69%. OM1 is rather small artery with at least moderate calcified plaque with  stenosis 50-69%. Other findings: Normal pulmonary vein drainage into the left atrium. Normal left atrial appendage without a thrombus. Normal size of the pulmonary artery. IMPRESSION: 1. Coronary calcium score of 4158. This was 52 percentile for age and sex matched control. 2. Normal coronary origin with right dominance. 3. Very challenging study, interpretation effected by arrhythmia during acquisition, patient's size (BMI 36) and severe calcifications. There is heavy diffuse predominantly calcified plaque and at least moderate stenosis in the mid LAD, proximal and distal RCA. Cardiac catheterization or CT FFR is recommended. Consider symptom-guided anti-ischemic pharmacotherapy as well as risk factor modification per guideline directed care. 4. Trileaflet aortic valve with severely calcified leaflets and severely restricted leaflet opening. Aortic valve calcium score 2882 consistent with severe aortic stenosis. 5. Severe extensive mitral annular calcifications. Electronically Signed   By: Ena Dawley   On: 10/16/2019 17:10   Result Date: 10/16/2019 EXAM: OVER-READ INTERPRETATION  CT CHEST The following report is an over-read performed by radiologist Dr. Vinnie Langton of [facility], [place] on 10/16/2019. This over-read does not include interpretation of cardiac or coronary anatomy or pathology. The coronary calcium score/coronary CTA interpretation by the cardiologist is attached. COMPARISON:  None. FINDINGS: Within the visualized portions of the thorax there are no suspicious appearing pulmonary nodules or  masses, there is no acute consolidative airspace disease, no pleural effusions, no pneumothorax and no lymphadenopathy. Visualized portions of the upper abdomen are unremarkable. There are no aggressive appearing lytic or blastic lesions noted in the visualized portions of the skeleton. IMPRESSION: No significant incidental noncardiac findings are noted. Electronically Signed: By: Vinnie Langton M.D. On: 10/16/2019 13:30   Medications: . sodium chloride    . sodium chloride    . cefTAZidime (FORTAZ)  IV 2 g (10/17/19 0916)  . vancomycin 1,000 mg (10/17/19 1043)   . acetaminophen  1,000 mg Oral Q8H  . alteplase      . aspirin EC  325 mg Oral Daily  . calcium acetate  2,001 mg Oral With snacks  . calcium acetate  2,668 mg Oral TID with meals  . Chlorhexidine Gluconate Cloth  6 each Topical Q0600  . clopidogrel  75 mg Oral Daily  . darbepoetin (ARANESP) injection - DIALYSIS  200 mcg Intravenous Q Mon-HD  . doxercalciferol  3 mcg Intravenous Q M,W,F-HD  . escitalopram  5 mg Oral Daily  . famotidine  10 mg Oral Q M,W,F-2000  . [START ON 10/18/2019] heparin  3,500 Units Dialysis Once in dialysis  . heparin  5,000 Units Subcutaneous Q8H  . levothyroxine  137 mcg Oral QAC breakfast  . lidocaine  2 patch Transdermal Daily  . psyllium  1 packet Oral Daily  . sodium zirconium cyclosilicate  10 g Oral QODAY

## 2019-10-17 NOTE — H&P (View-Only) (Signed)
Progress Note  Patient Name: [name] Date of Encounter: 10/17/2019  [facility] Cardiologist: Lauree Chandler, MD   Subjective   No chest pain this am.   Inpatient Medications    Scheduled Meds: . acetaminophen  1,000 mg Oral Q8H  . aspirin EC  325 mg Oral Daily  . calcium acetate  2,001 mg Oral With snacks  . calcium acetate  2,668 mg Oral TID with meals  . Chlorhexidine Gluconate Cloth  6 each Topical Q0600  . clopidogrel  75 mg Oral Daily  . darbepoetin (ARANESP) injection - DIALYSIS  200 mcg Intravenous Q Mon-HD  . doxercalciferol  3 mcg Intravenous Q M,W,F-HD  . escitalopram  5 mg Oral Daily  . famotidine  10 mg Oral Q M,W,F-2000  . heparin  3,000 Units Dialysis Once in dialysis  . [START ON 10/18/2019] heparin  3,500 Units Dialysis Once in dialysis  . heparin  5,000 Units Subcutaneous Q8H  . levothyroxine  137 mcg Oral QAC breakfast  . lidocaine  2 patch Transdermal Daily  . psyllium  1 packet Oral Daily  . sodium zirconium cyclosilicate  10 g Oral QODAY   Continuous Infusions: . sodium chloride    . sodium chloride    . cefTAZidime (FORTAZ)  IV Stopped (10/15/19 1341)  . vancomycin Stopped (10/15/19 1341)   PRN Meds: sodium chloride, sodium chloride, [START ON 10/18/2019] alteplase, heparin, hydrOXYzine, [START ON 10/18/2019] lidocaine (PF), [START ON 10/18/2019] lidocaine-prilocaine, Muscle Rub, [DISCONTINUED] oxyCODONE-acetaminophen **AND** oxyCODONE, pentafluoroprop-tetrafluoroeth, promethazine, traZODone, zolpidem   Vital Signs    Vitals:   10/17/19 0615 10/17/19 0702 10/17/19 0707 10/17/19 0715  BP: (!) 116/59 (!) 143/76 137/71 (!) 113/57  Pulse: (!) 58 60 62 (!) 57  Resp: 17 18 17 16   Temp: 97.8 F (36.6 C) 98.7 F (37.1 C)    TempSrc: Oral Oral    SpO2: 100% 100%    Weight:   117.7 kg   Height:        Intake/Output Summary (Last 24 hours) at 10/17/2019 0753 Last data filed at 10/17/2019 0600 Gross per 24 hour  Intake 1020  ml  Output 50 ml  Net 970 ml   Last 3 Weights 10/17/2019 10/16/2019 10/14/2019  Weight (lbs) 259 lb 7.7 oz 260 lb 5.8 oz 257 lb 0.9 oz  Weight (kg) 117.7 kg 118.1 kg 116.6 kg      Telemetry    Sinus - Personally Reviewed  ECG    No AM EKG - Personally Reviewed  Physical Exam    General: Well developed, well nourished, NAD  Neuro: No focal deficits Psychiatric: Mood and affect normal  Neck: No JVD Lungs:Clear bilaterally, no wheezes, rhonci, crackles Cardiovascular: Regular rate and rhythm. Systolic murmur.  Abdomen:Soft. Bowel sounds present. Non-tender.  Extremities: No lower extremity edema.    Labs    High Sensitivity Troponin:   Recent Labs  Lab 10/14/19 1208 10/14/19 1406  TROPONINIHS 21* 19*      Chemistry Recent Labs  Lab 10/14/19 0306 10/15/19 0256 10/16/19 0244  NA 136 135 134*  K 5.0 5.4* 5.6*  CL 97* 97* 98  CO2 25 26 27   GLUCOSE 91 86 93  BUN 51* 73* 57*  CREATININE 7.72* 9.69* 7.92*  CALCIUM 9.5 9.4 9.3  PROT 7.6 7.3 7.2  ALBUMIN 2.9* 2.9* 2.9*  AST 10* 9* 10*  ALT 6 6 7   ALKPHOS 69 72 72  BILITOT 0.3 0.4 0.7  GFRNONAA 7* 5* 7*  ANIONGAP 14 12 9  Hematology Recent Labs  Lab 10/15/19 0256 10/16/19 0244 10/17/19 0719  WBC 5.1 5.3 5.3  RBC 2.91* 2.96* 3.03*  HGB 8.4* 8.8* 8.7*  HCT 28.6* 29.7* 30.2*  MCV 98.3 100.3* 99.7  MCH 28.9 29.7 28.7  MCHC 29.4* 29.6* 28.8*  RDW 17.9* 18.2* 18.4*  PLT 179 179 176    BNPNo results for input(s): BNP, PROBNP in the last 168 hours.   DDimer No results for input(s): DDIMER in the last 168 hours.   Radiology    CT CORONARY MORPH W/CTA COR W/SCORE W/CA W/CM &/OR WO/CM  Addendum Date: 10/16/2019   ADDENDUM REPORT: 10/16/2019 17:10 CLINICAL DATA:  54 year old male with aortic stenosis and ESRD. EXAM: Cardiac/Coronary  CTA TECHNIQUE: The patient was scanned on a [employer]. FINDINGS: A 100 kV prospective scan was triggered in the descending thoracic aorta at 111 HU's.  Axial non-contrast 3 mm slices were carried out through the heart. The data set was analyzed on a dedicated work station and scored using the [place]. Gantry rotation speed was 250 msecs and collimation was .6 mm. No beta blockade and 0.8 mg of sl NTG was given. The 3D data set was reconstructed in 5% intervals of the 67-82 % of the R-R cycle. Diastolic phases were analyzed on a dedicated work station using MPR, MIP and VRT modes. The patient received 80 cc of contrast. Aorta:  Normal size.  Mild atherosclerotic plaque. No dissection. Aortic Valve: Trileaflet with severely calcified leaflets and severely restricted leaflet opening. Aortic valve calcium score 2882 consistent with severe aortic stenosis. Coronary Arteries:  Normal coronary origin.  Right dominance. RCA is a large dominant artery that gives rise to PDA and PLA. Proximal RCA has severe mixed, predominantly calcified plaque with stenosis at least 50-69%. Mid RCA has diffuse mild plaque. Distal RCA has severe mixed diffuse plaque that is possibly > 70%. PDA and PLA are not interpretable. Left main is a large artery that gives rise to LAD, ramus intermedius and LCX arteries. Left main has minimal plaque. LAD is a large vessel that gives rise to one diagonal artery. Proximal LAD has minimal plaque. Mid LAD has a long segment of severe calcified plaque with stenosis at least 50-69%, but possible > 70%. Distal LAD has only mild plaque. Ramus intermedius is a small lumen artery with mild diffuse plaque. LCX is a small lumen non-dominant artery that gives rise to one small OM1 branch. There is at least moderate calcified plaque in the proximal LCX artery with stenosis 50-69%. OM1 is rather small artery with at least moderate calcified plaque with stenosis 50-69%. Other findings: Normal pulmonary vein drainage into the left atrium. Normal left atrial appendage without a thrombus. Normal size of the pulmonary artery. IMPRESSION: 1. Coronary calcium score  of 4158. This was 70 percentile for age and sex matched control. 2. Normal coronary origin with right dominance. 3. Very challenging study, interpretation effected by arrhythmia during acquisition, patient's size (BMI 36) and severe calcifications. There is heavy diffuse predominantly calcified plaque and at least moderate stenosis in the mid LAD, proximal and distal RCA. Cardiac catheterization or CT FFR is recommended. Consider symptom-guided anti-ischemic pharmacotherapy as well as risk factor modification per guideline directed care. 4. Trileaflet aortic valve with severely calcified leaflets and severely restricted leaflet opening. Aortic valve calcium score 2882 consistent with severe aortic stenosis. 5. Severe extensive mitral annular calcifications. Electronically Signed   By: Ena Dawley   On: 10/16/2019 17:10   Result Date: 10/16/2019  EXAM: OVER-READ INTERPRETATION  CT CHEST The following report is an over-read performed by radiologist Dr. Vinnie Langton of [facility], [place] on 10/16/2019. This over-read does not include interpretation of cardiac or coronary anatomy or pathology. The coronary calcium score/coronary CTA interpretation by the cardiologist is attached. COMPARISON:  None. FINDINGS: Within the visualized portions of the thorax there are no suspicious appearing pulmonary nodules or masses, there is no acute consolidative airspace disease, no pleural effusions, no pneumothorax and no lymphadenopathy. Visualized portions of the upper abdomen are unremarkable. There are no aggressive appearing lytic or blastic lesions noted in the visualized portions of the skeleton. IMPRESSION: No significant incidental noncardiac findings are noted. Electronically Signed: By: Vinnie Langton M.D. On: 10/16/2019 13:30    Cardiac Studies   Echo 10/05/19:  1. Left ventricular ejection fraction, by estimation, is 65 to 70%. The  left ventricle has normal function. The left ventricle has no  regional  wall motion abnormalities. There is mild left ventricular hypertrophy.  Left ventricular diastolic parameters  are consistent with Grade II diastolic dysfunction (pseudonormalization).  2. Right ventricular systolic function is normal. The right ventricular  size is normal. Tricuspid regurgitation signal is inadequate for assessing  PA pressure.  3. Left atrial size was moderately dilated.  4. The mitral valve is abnormal. Trivial mitral valve regurgitation.  Moderate mitral annular calcification.  5. The aortic valve is functionally bicuspid. There is moderate  calcification of the aortic valve and evidence of severe stenosis. Aortic  valve mean gradient measures 37.2 mmHg. Aortic valve Vmax measures 3.88  m/s. Dimentionless index 0.25  6. The inferior vena cava is normal in size with greater than 50%  respiratory variability, suggesting right atrial pressure of 3 mmHg.   Patient Profile     54 y.o. male with history of moderately severe to severe AS, bacterial endocarditis, HTN, ESRD on HD, GERD, seizure disorder, spina bifida with myelomeningocele, kidney stones, s/p stenting of left iliac artery in 2018 followed closely by Dr. Donzetta Matters in [facility],  and hypothyroidism who is seen for the evaluation of chest pain  Assessment & Plan    1. CAD with unstable angina: He has had exertional chest pain. Gated coronary CTA yesterday with heavily calcified, three vessel CAD but difficult to define severity due to calcification. Will need a cardiac cath to better define his CAD. He will likely end up needing surgical AVR and CABG but he will be a marginal candidate for this given his open wound on his left thigh with the wound vac in place. His access for the cardiac cath is problematic. I think the right femoral artery can be used. The right radial, left radial and left femoral are not options given prior dialysis access/grafts.    I have reviewed the risks, indications, and alternatives to  cardiac catheterization, possible angioplasty, and stenting with the patient. Risks include but are not limited to bleeding, infection, vascular injury, stroke, myocardial infection, arrhythmia, kidney injury, radiation-related injury in the case of prolonged fluoroscopy use, emergency cardiac surgery, and death. The patient understands the risks of serious complication is 1-2 in 1950 with diagnostic cardiac cath and 1-2% or less with angioplasty/stenting.  2. Severe aortic stenosis: His aortic stenosis is now severe. Calcium score 2882 on CT scan. Mean gradient 37 mmHg with dimensionless index of 0.25.    For questions or updates, please contact [place] Please consult [email or web address] for contact info under        Signed, Lauree Chandler, MD  10/17/2019, 7:53 AM

## 2019-10-17 NOTE — Progress Notes (Signed)
PT Cancellation Note  Patient Details Name: [name] MRN: [id number] DOB: 1965-08-18   Cancelled Treatment:    Reason Eval/Treat Not Completed: Patient at procedure or test/unavailable. Attempted x2 this morning but the pt was at HD and then taken to cath lab as soon as he returned to the floor. PT will continue to follow and attempt to treat as time/schedule allow.   Karma Ganja, PT, DPT   Acute Rehabilitation Department Pager #: [phone]   Otho Bellows 10/17/2019, 1:19 PM

## 2019-10-17 NOTE — Progress Notes (Addendum)
Family Medicine Teaching Service Daily Progress Note Intern Pager: [phone]  Patient name: [name] Medical record number: [id number] Date of birth: 1965/01/09 Age: 53 y.o. Gender: male  Primary Care Provider: [name], MD Consultants: Nephrology, vascular surgery, orthopedics Code Status: FULL  Pt Overview and Major Events to Date:  10/1 Admitted, HD attempted (terminated early due to pain) 10/2Inferior venacavogram(right), I&D of left thigh with graft excision, HD 10/4 Joint aspiration left knee 10/5 Arthroscopic left knee irrigation and drainage with placement of antibiotic beads 10/14 Cardiac gated CTA chest 10/15 Plan for cardiac catheterization  Assessment and Plan: [name] is a 54 y.o. male presenting with inability to obtain access at his right femoral dialysis catheter while at HD, now with infected left thigh wound, presumed septic arthritis of left knee and atypical chest pain. PMH is significant for ESRD on HD (MWF), PAD, kidney stones, spina bifida, hx of nephrostomy tube, HTN, GERD, anxiety.  Infected left thigh wound Infected left thigh wound from old graft. S/p I&D of left thigh with graft excision 10/2. Wound culture growing few corynebacterium striatum. ID consulted, appreciate recommendations.  Wound VAC replaced, no issues. - antibiotics as below  Acute left knee pain Presumed left knee septic arthritis. S/p joint aspiration and arthroscopic left knee irrigation and drainage with placement of antibiotic beads. Cultures negative. Overall improving. - oxycodone 10 mg q6 prn - scheduled Tylenol  Fever Resolved, being treated. Undergoing antibiotic treatment for corynebacterium striatum on left thigh wound culture. Blood cultures and synovial fluid cultures negative. Per ID, antibiotic plan: Vancomycin and ceftazidime MWF with dialysis for 4 weeks total course (until 11/2). Afebrile since 10/4. Will need weekly CBC, CMP, CRP, and predialysis  vancomycin level outpatient. - s/p piperacillin-tazobactam (10/4-10/6)  - s/p CTX (10/6-10/7) - vancomycin MWF with HD (10/4-) - ceftazidime MWF with HD (10/8-) - Tylenol prn - CRP tomorrow  ESRD MWF dialysis, continue while inpatient per nephrology.  PAD At home on ASA 325 mg and clopidogrel. States he takes these so his dialysis catheter doesn't clot. - continue home meds  CAD Newly diagnosed. Patient with exertional chest pain, also with atypical features (reproducible, pleuritic). Gated coronary CTA revealing three-vessel CAD, will need cardiac catheterization to better define CAD. This will be difficult given poor access.  No evidence of ischemia on biochemical markers and EKG.  Patient already on DAPT for PAD, will likely need further risk stratification. HbA1c likely inaccurate in the setting of dialysis, he has been euglycemic throughout admission with CBGs mostly 80s to 90s. - cardiac catheterization today - lipid panel tomorrow  Aortic stenosis Severe AS noted on TTE this admission. - outpatient follow-up (sees Dr. Angelena Form)  Anxiety At home, takes lorazepam 1 mg daily prn.  We will try to have anxiety controlled with SSRI and as needed hydroxyzine, will try to avoid restarting benzo. - escitalopram 5 mg daily started - hydroxyzine 10 mg TID prn - holding home lorazepam  Insomnia Takes zolpidem prn for sleep, trazodone prn for nightmares. - continue home meds  FEN/GI: renal diet PPx: [place]  Disposition: med-surg  Subjective:  Seen this morning in HD with Dr. Angelena Form, cardiologist, present. Plan for cardiac catheterization this afternoon. Patient expresses worry about results of CTA yesterday. He believes he only has a few years left to live and expresses regret about poor choices he has made in the past, such as his diet. Denies chest pain currently.  Objective: Temp:  [97.8 F (36.6 C)-99 F (37.2 C)] 98.7 F (37.1 C) (  10/15 0702) Pulse Rate:  [57-70] 63  (10/15 0745) Resp:  [15-18] 15 (10/15 0745) BP: (113-157)/(57-123) 131/68 (10/15 0745) SpO2:  [97 %-100 %] 100 % (10/15 0702) Weight:  [117.7 kg-118.1 kg] 117.7 kg (10/15 0707) Physical Exam: General:Obese male, tearful, NAD Cardiovascular:RRR, harsh systolic murmur best heard at LUSB Respiratory:CTAB Abdomen:soft, mild left-sided tenderness, +BS Extremities:WWP, no edema, mild tenderness left knee Derm: left lower extremity dressings CDI, wound VAC in place without any surrounding erythema or drainage  Laboratory: Recent Labs  Lab 10/15/19 0256 10/16/19 0244 10/17/19 0719  WBC 5.1 5.3 5.3  HGB 8.4* 8.8* 8.7*  HCT 28.6* 29.7* 30.2*  PLT 179 179 176   Recent Labs  Lab 10/14/19 0306 10/14/19 0306 10/15/19 0256 10/16/19 0244 10/17/19 0725  NA 136   < > 135 134* 130*  K 5.0   < > 5.4* 5.6* 6.0*  CL 97*   < > 97* 98 93*  CO2 25   < > 26 27 22   BUN 51*   < > 73* 57* 78*  CREATININE 7.72*   < > 9.69* 7.92* 10.15*  CALCIUM 9.5   < > 9.4 9.3 9.3  PROT 7.6  --  7.3 7.2  --   BILITOT 0.3  --  0.4 0.7  --   ALKPHOS 69  --  72 72  --   ALT 6  --  6 7  --   AST 10*  --  9* 10*  --   GLUCOSE 91   < > 86 93 85   < > = values in this interval not displayed.    Imaging/Diagnostic Tests: CT CORONARY MORPH W/CTA COR W/SCORE W/CA W/CM &/OR WO/CM  Addendum Date: 10/16/2019   ADDENDUM REPORT: 10/16/2019 17:10 CLINICAL DATA:  54 year old male with aortic stenosis and ESRD. EXAM: Cardiac/Coronary  CTA TECHNIQUE: The patient was scanned on a [employer]. FINDINGS: A 100 kV prospective scan was triggered in the descending thoracic aorta at 111 HU's. Axial non-contrast 3 mm slices were carried out through the heart. The data set was analyzed on a dedicated work station and scored using the [place]. Gantry rotation speed was 250 msecs and collimation was .6 mm. No beta blockade and 0.8 mg of sl NTG was given. The 3D data set was reconstructed in 5% intervals of the 67-82  % of the R-R cycle. Diastolic phases were analyzed on a dedicated work station using MPR, MIP and VRT modes. The patient received 80 cc of contrast. Aorta:  Normal size.  Mild atherosclerotic plaque. No dissection. Aortic Valve: Trileaflet with severely calcified leaflets and severely restricted leaflet opening. Aortic valve calcium score 2882 consistent with severe aortic stenosis. Coronary Arteries:  Normal coronary origin.  Right dominance. RCA is a large dominant artery that gives rise to PDA and PLA. Proximal RCA has severe mixed, predominantly calcified plaque with stenosis at least 50-69%. Mid RCA has diffuse mild plaque. Distal RCA has severe mixed diffuse plaque that is possibly > 70%. PDA and PLA are not interpretable. Left main is a large artery that gives rise to LAD, ramus intermedius and LCX arteries. Left main has minimal plaque. LAD is a large vessel that gives rise to one diagonal artery. Proximal LAD has minimal plaque. Mid LAD has a long segment of severe calcified plaque with stenosis at least 50-69%, but possible > 70%. Distal LAD has only mild plaque. Ramus intermedius is a small lumen artery with mild diffuse plaque. LCX is a small lumen  non-dominant artery that gives rise to one small OM1 branch. There is at least moderate calcified plaque in the proximal LCX artery with stenosis 50-69%. OM1 is rather small artery with at least moderate calcified plaque with stenosis 50-69%. Other findings: Normal pulmonary vein drainage into the left atrium. Normal left atrial appendage without a thrombus. Normal size of the pulmonary artery. IMPRESSION: 1. Coronary calcium score of 4158. This was 37 percentile for age and sex matched control. 2. Normal coronary origin with right dominance. 3. Very challenging study, interpretation effected by arrhythmia during acquisition, patient's size (BMI 36) and severe calcifications. There is heavy diffuse predominantly calcified plaque and at least moderate stenosis  in the mid LAD, proximal and distal RCA. Cardiac catheterization or CT FFR is recommended. Consider symptom-guided anti-ischemic pharmacotherapy as well as risk factor modification per guideline directed care. 4. Trileaflet aortic valve with severely calcified leaflets and severely restricted leaflet opening. Aortic valve calcium score 2882 consistent with severe aortic stenosis. 5. Severe extensive mitral annular calcifications. Electronically Signed   By: Ena Dawley   On: 10/16/2019 17:10   Result Date: 10/16/2019 EXAM: OVER-READ INTERPRETATION  CT CHEST The following report is an over-read performed by radiologist Dr. Vinnie Langton of [facility], [place] on 10/16/2019. This over-read does not include interpretation of cardiac or coronary anatomy or pathology. The coronary calcium score/coronary CTA interpretation by the cardiologist is attached. COMPARISON:  None. FINDINGS: Within the visualized portions of the thorax there are no suspicious appearing pulmonary nodules or masses, there is no acute consolidative airspace disease, no pleural effusions, no pneumothorax and no lymphadenopathy. Visualized portions of the upper abdomen are unremarkable. There are no aggressive appearing lytic or blastic lesions noted in the visualized portions of the skeleton. IMPRESSION: No significant incidental noncardiac findings are noted. Electronically Signed: By: Vinnie Langton M.D. On: 10/16/2019 13:30     Zola Button, MD 10/17/2019, 8:41 AM  PGY-1, [place] Intern pager: [phone], text pages welcome

## 2019-10-18 DIAGNOSIS — I2584 Coronary atherosclerosis due to calcified coronary lesion: Secondary | ICD-10-CM

## 2019-10-18 DIAGNOSIS — I2510 Atherosclerotic heart disease of native coronary artery without angina pectoris: Secondary | ICD-10-CM

## 2019-10-18 DIAGNOSIS — I200 Unstable angina: Secondary | ICD-10-CM

## 2019-10-18 LAB — CBC
HCT: 29.5 % — ABNORMAL LOW (ref 39.0–52.0)
Hemoglobin: 8.5 g/dL — ABNORMAL LOW (ref 13.0–17.0)
MCH: 28.8 pg (ref 26.0–34.0)
MCHC: 28.8 g/dL — ABNORMAL LOW (ref 30.0–36.0)
MCV: 100 fL (ref 80.0–100.0)
Platelets: 153 10*3/uL (ref 150–400)
RBC: 2.95 MIL/uL — ABNORMAL LOW (ref 4.22–5.81)
RDW: 18.6 % — ABNORMAL HIGH (ref 11.5–15.5)
WBC: 5.3 10*3/uL (ref 4.0–10.5)
nRBC: 0 % (ref 0.0–0.2)

## 2019-10-18 LAB — LIPID PANEL
Cholesterol: 123 mg/dL (ref 0–200)
HDL: 41 mg/dL (ref >40)
LDL Cholesterol: 68 mg/dL (ref 0–99)
Total CHOL/HDL Ratio: 3 RATIO
Triglycerides: 69 mg/dL (ref <150)
VLDL: 14 mg/dL (ref 0–40)

## 2019-10-18 LAB — C-REACTIVE PROTEIN: CRP: 2.4 mg/dL — ABNORMAL HIGH (ref <1.0)

## 2019-10-18 LAB — RENAL FUNCTION PANEL
Albumin: 2.9 g/dL — ABNORMAL LOW (ref 3.5–5.0)
Anion gap: 12 (ref 5–15)
BUN: 45 mg/dL — ABNORMAL HIGH (ref 6–20)
CO2: 24 mmol/L (ref 22–32)
Calcium: 9.1 mg/dL (ref 8.9–10.3)
Chloride: 97 mmol/L — ABNORMAL LOW (ref 98–111)
Creatinine, Ser: 6.88 mg/dL — ABNORMAL HIGH (ref 0.61–1.24)
GFR, Estimated: 8 mL/min — ABNORMAL LOW (ref >60)
Glucose, Bld: 102 mg/dL — ABNORMAL HIGH (ref 70–99)
Phosphorus: 4.1 mg/dL (ref 2.5–4.6)
Potassium: 5.4 mmol/L — ABNORMAL HIGH (ref 3.5–5.1)
Sodium: 133 mmol/L — ABNORMAL LOW (ref 135–145)

## 2019-10-18 MED ORDER — ROSUVASTATIN CALCIUM 5 MG PO TABS
10.0000 mg | ORAL_TABLET | Freq: Every day | ORAL | Status: DC
  Administered 2019-10-18: 10 mg via ORAL
  Filled 2019-10-18: qty 2

## 2019-10-18 MED ORDER — ESCITALOPRAM OXALATE 5 MG PO TABS: 5.0000 mg | ORAL_TABLET | Freq: Every day | ORAL | 0 refills | Status: DC

## 2019-10-18 MED ORDER — VANCOMYCIN HCL IN DEXTROSE 1-5 GM/200ML-% IV SOLN: 1000.0000 mg | INTRAVENOUS | 0 refills | Status: DC

## 2019-10-18 MED ORDER — SODIUM CHLORIDE 0.9 % IV SOLN: 2.0000 g | INTRAVENOUS | Status: DC

## 2019-10-18 MED ORDER — ROSUVASTATIN CALCIUM 10 MG PO TABS: 10.0000 mg | ORAL_TABLET | Freq: Every day | ORAL | 0 refills | Status: DC

## 2019-10-18 NOTE — Progress Notes (Signed)
Patient is medically stable for discharge home today.    Lyndee Hensen, DO PGY-2, [facility] 10/18/2019 2:26 PM

## 2019-10-18 NOTE — Progress Notes (Signed)
PT Cancellation Note  Patient Details Name: [name] MRN: [id number] DOB: January 24, 1965   Cancelled Treatment:    Reason Eval/Treat Not Completed: Patient declined, no reason specified Checked in with patient to discuss discharge, any further questions or concerns regarding mobility and home safety. Declines activity today due to recent episodes of chest pain. He is awaiting ride for d/c. RN notified of questions he has about wound vac.  Ellouise Newer 10/18/2019, 1:33 PM

## 2019-10-18 NOTE — Progress Notes (Signed)
Progress Note  Patient Name: [name] Date of Encounter: 10/18/2019  [facility] Cardiologist: Lauree Chandler, MD   Subjective   Complaining of left testicular pain that kept him up all night. Pain medication did not help.  Inpatient Medications    Scheduled Meds: . acetaminophen  1,000 mg Oral Q8H  . aspirin EC  325 mg Oral Daily  . calcium acetate  2,001 mg Oral With snacks  . calcium acetate  2,668 mg Oral TID with meals  . Chlorhexidine Gluconate Cloth  6 each Topical Q0600  . clopidogrel  75 mg Oral Daily  . darbepoetin (ARANESP) injection - DIALYSIS  200 mcg Intravenous Q Mon-HD  . doxercalciferol  3 mcg Intravenous Q M,W,F-HD  . escitalopram  5 mg Oral Daily  . famotidine  10 mg Oral Q M,W,F-2000  . heparin  5,000 Units Subcutaneous Q8H  . levothyroxine  137 mcg Oral QAC breakfast  . lidocaine  2 patch Transdermal Daily  . psyllium  1 packet Oral Daily  . sodium chloride flush  3 mL Intravenous Q12H  . sodium zirconium cyclosilicate  10 g Oral QODAY   Continuous Infusions: . sodium chloride    . cefTAZidime (FORTAZ)  IV 2 g (10/17/19 0916)  . vancomycin Stopped (10/17/19 1145)   PRN Meds: sodium chloride, acetaminophen, hydrOXYzine, Muscle Rub, ondansetron (ZOFRAN) IV, [DISCONTINUED] oxyCODONE-acetaminophen **AND** oxyCODONE, promethazine, sodium chloride flush, traZODone, zolpidem   Vital Signs    Vitals:   10/17/19 1840 10/17/19 2023 10/18/19 0455 10/18/19 0843  BP: (!) 104/57 (!) 90/45 (!) 111/92 (!) 127/54  Pulse:  68 (!) 57 64  Resp:  16 15 16   Temp:  98.8 F (37.1 C) 98.9 F (37.2 C) 98.4 F (36.9 C)  TempSrc:  Oral Axillary Oral  SpO2:  100% 100% 97%  Weight:   120 kg   Height:        Intake/Output Summary (Last 24 hours) at 10/18/2019 1228 Last data filed at 10/18/2019 1044 Gross per 24 hour  Intake 483 ml  Output --  Net 483 ml   Last 3 Weights 10/18/2019 10/17/2019 10/17/2019  Weight (lbs) 264 lb 8.8 oz 254 lb 10.1  oz 259 lb 7.7 oz  Weight (kg) 120 kg 115.5 kg 117.7 kg      Telemetry    Not on telemetry currently - Personally Reviewed  ECG    No new ECG - Personally Reviewed  Physical Exam   GEN: Laying in bed, complaining of pain in left testicle   Neck: No JVD Cardiac: RRR, 2/6 crescendo-descrescendo murmur that radiates to carotids Respiratory: Clear to auscultation bilaterally. GI: Soft, nontender, non-distended  MS: No edema; No deformity. Neuro:  Nonfocal   Labs    High Sensitivity Troponin:   Recent Labs  Lab 10/14/19 1208 10/14/19 1406  TROPONINIHS 21* 19*      Chemistry Recent Labs  Lab 10/14/19 0306 10/14/19 0306 10/15/19 0256 10/15/19 0256 10/16/19 0244 10/17/19 0725 10/18/19 0301  NA 136   < > 135   < > 134* 130* 133*  K 5.0   < > 5.4*   < > 5.6* 6.0* 5.4*  CL 97*   < > 97*   < > 98 93* 97*  CO2 25   < > 26   < > 27 22 24   GLUCOSE 91   < > 86   < > 93 85 102*  BUN 51*   < > 73*   < > 57* 78* 45*  CREATININE 7.72*   < > 9.69*   < > 7.92* 10.15* 6.88*  CALCIUM 9.5   < > 9.4   < > 9.3 9.3 9.1  PROT 7.6  --  7.3  --  7.2  --   --   ALBUMIN 2.9*   < > 2.9*   < > 2.9* 3.0* 2.9*  AST 10*  --  9*  --  10*  --   --   ALT 6  --  6  --  7  --   --   ALKPHOS 69  --  72  --  72  --   --   BILITOT 0.3  --  0.4  --  0.7  --   --   GFRNONAA 7*   < > 5*   < > 7* 5* 8*  ANIONGAP 14   < > 12   < > 9 15 12    < > = values in this interval not displayed.     Hematology Recent Labs  Lab 10/16/19 0244 10/17/19 0719 10/18/19 0301  WBC 5.3 5.3 5.3  RBC 2.96* 3.03* 2.95*  HGB 8.8* 8.7* 8.5*  HCT 29.7* 30.2* 29.5*  MCV 100.3* 99.7 100.0  MCH 29.7 28.7 28.8  MCHC 29.6* 28.8* 28.8*  RDW 18.2* 18.4* 18.6*  PLT 179 176 153    BNPNo results for input(s): BNP, PROBNP in the last 168 hours.   DDimer No results for input(s): DDIMER in the last 168 hours.   Radiology    CARDIAC CATHETERIZATION  Result Date: 10/17/2019  Mid LAD lesion is 25% stenosed.  1st RPL  lesion is 25% stenosed.  Prox Cx to Mid Cx lesion is 25% stenosed.  LV end diastolic pressure is normal.  There is severe aortic valve stenosis. Mean gradient 34 mm Hg.  Unable to access left femoral vein.  Continue aggressive preventive therapy. Management of aortic stenosis per TAVR team.   CT CORONARY MORPH W/CTA COR W/SCORE W/CA W/CM &/OR WO/CM  Addendum Date: 10/16/2019   ADDENDUM REPORT: 10/16/2019 17:10 CLINICAL DATA:  54 year old male with aortic stenosis and ESRD. EXAM: Cardiac/Coronary  CTA TECHNIQUE: The patient was scanned on a [employer]. FINDINGS: A 100 kV prospective scan was triggered in the descending thoracic aorta at 111 HU's. Axial non-contrast 3 mm slices were carried out through the heart. The data set was analyzed on a dedicated work station and scored using the [place]. Gantry rotation speed was 250 msecs and collimation was .6 mm. No beta blockade and 0.8 mg of sl NTG was given. The 3D data set was reconstructed in 5% intervals of the 67-82 % of the R-R cycle. Diastolic phases were analyzed on a dedicated work station using MPR, MIP and VRT modes. The patient received 80 cc of contrast. Aorta:  Normal size.  Mild atherosclerotic plaque. No dissection. Aortic Valve: Trileaflet with severely calcified leaflets and severely restricted leaflet opening. Aortic valve calcium score 2882 consistent with severe aortic stenosis. Coronary Arteries:  Normal coronary origin.  Right dominance. RCA is a large dominant artery that gives rise to PDA and PLA. Proximal RCA has severe mixed, predominantly calcified plaque with stenosis at least 50-69%. Mid RCA has diffuse mild plaque. Distal RCA has severe mixed diffuse plaque that is possibly > 70%. PDA and PLA are not interpretable. Left main is a large artery that gives rise to LAD, ramus intermedius and LCX arteries. Left main has minimal plaque. LAD is a large vessel that  gives rise to one diagonal artery. Proximal LAD has  minimal plaque. Mid LAD has a long segment of severe calcified plaque with stenosis at least 50-69%, but possible > 70%. Distal LAD has only mild plaque. Ramus intermedius is a small lumen artery with mild diffuse plaque. LCX is a small lumen non-dominant artery that gives rise to one small OM1 branch. There is at least moderate calcified plaque in the proximal LCX artery with stenosis 50-69%. OM1 is rather small artery with at least moderate calcified plaque with stenosis 50-69%. Other findings: Normal pulmonary vein drainage into the left atrium. Normal left atrial appendage without a thrombus. Normal size of the pulmonary artery. IMPRESSION: 1. Coronary calcium score of 4158. This was 68 percentile for age and sex matched control. 2. Normal coronary origin with right dominance. 3. Very challenging study, interpretation effected by arrhythmia during acquisition, patient's size (BMI 36) and severe calcifications. There is heavy diffuse predominantly calcified plaque and at least moderate stenosis in the mid LAD, proximal and distal RCA. Cardiac catheterization or CT FFR is recommended. Consider symptom-guided anti-ischemic pharmacotherapy as well as risk factor modification per guideline directed care. 4. Trileaflet aortic valve with severely calcified leaflets and severely restricted leaflet opening. Aortic valve calcium score 2882 consistent with severe aortic stenosis. 5. Severe extensive mitral annular calcifications. Electronically Signed   By: Ena Dawley   On: 10/16/2019 17:10   Result Date: 10/16/2019 EXAM: OVER-READ INTERPRETATION  CT CHEST The following report is an over-read performed by radiologist Dr. Vinnie Langton of [facility] [place] on 10/16/2019. This over-read does not include interpretation of cardiac or coronary anatomy or pathology. The coronary calcium score/coronary CTA interpretation by the cardiologist is attached. COMPARISON:  None. FINDINGS: Within the visualized portions  of the thorax there are no suspicious appearing pulmonary nodules or masses, there is no acute consolidative airspace disease, no pleural effusions, no pneumothorax and no lymphadenopathy. Visualized portions of the upper abdomen are unremarkable. There are no aggressive appearing lytic or blastic lesions noted in the visualized portions of the skeleton. IMPRESSION: No significant incidental noncardiac findings are noted. Electronically Signed: By: Vinnie Langton M.D. On: 10/16/2019 13:30    Cardiac Studies  Cath 10/17/19:  Mid LAD lesion is 25% stenosed.  1st RPL lesion is 25% stenosed.  Prox Cx to Mid Cx lesion is 25% stenosed.  LV end diastolic pressure is normal.  There is severe aortic valve stenosis. Mean gradient 34 mm Hg.  Unable to access left femoral vein.   TTE 10/05/2019: 1. Left ventricular ejection fraction, by estimation, is 65 to 70%. The  left ventricle has normal function. The left ventricle has no regional  wall motion abnormalities. There is mild left ventricular hypertrophy.  Left ventricular diastolic parameters  are consistent with Grade II diastolic dysfunction (pseudonormalization).  2. Right ventricular systolic function is normal. The right ventricular  size is normal. Tricuspid regurgitation signal is inadequate for assessing  PA pressure.  3. Left atrial size was moderately dilated.  4. The mitral valve is abnormal. Trivial mitral valve regurgitation.  Moderate mitral annular calcification.  5. The aortic valve is functionally bicuspid. There is moderate  calcification of the aortic valve and evidence of severe stenosis. Aortic  valve mean gradient measures 37.2 mmHg. Aortic valve Vmax measures 3.88  m/s. Dimentionless index 0.25  6. The inferior vena cava is normal in size with greater than 50%  respiratory variability, suggesting right atrial pressure of 3 mmHg.   Patient Profile  54 y.o. male with history of moderately severe to severe  AS, bacterial endocarditis, HTN, ESRD on HD, GERD, seizure disorder, spina bifida with myelomeningocele, kidney stones, s/p stenting of left iliac artery in 2018 followed closely by Dr. Donzetta Matters in [facility], and hypothyroidism who is seen for the evaluation ofchest pain  Assessment & Plan    #Chest Pain: CTA with heavy calcification with difficulty discerning degree of stenosis. Cath with non-obstructive CAD. On medical management. -Continue ASA 325mg  daily (regimen prescribed by vascular physician) -Continue plavix 75mg  daily (regimen prescribed by vascular physician) -Start Crestor 10mg  daily -Okay to discharge home from CV perspective  #Severe AS: Mean gradient 72mmHg with DI 0.25 and CT calcium score 2882.  -Follow-up with Dr. Angelena Form as out-patient for further management  For questions or updates, please contact [place] Please consult [email or web address] for contact info under        Signed, Freada Bergeron, MD  10/18/2019, 12:28 PM

## 2019-10-18 NOTE — Discharge Summary (Signed)
[facility] Discharge Summary  Patient name: [name] Medical record number: [id number] Date of birth: 12-28-1965 Age: 54 y.o. Gender: male Date of Admission: 10/03/2019  Date of Discharge: 10/18/19 Admitting Physician: Lyndee Hensen, DO  Primary Care Provider: [name], MD Consultants: Cardiology, Nephrology, SLP,   Indication for Hospitalization: complication with HD catheter   Discharge Diagnoses/Problem List:  ESRD on HD Atypical chest pain Left knee pain Left thigh wound Fever PAD Aortic stenosis Insomnia Anxiety GERD Recurrent kidney stones Hypertension  Disposition: Home  Discharge Condition: Improved and Stable  Discharge Exam:   General:Obese male, in no acute distress Cardiovascular:Regular rate and rhythm, harsh systolic murmur best heard at LUSB, right thigh fistula Respiratory:Clear to auscultation bilaterally Abdomen:soft,nontender, bowel sounds present Genitalia: no erythema, edema, ecchymosis or tenderness of scrotum (examined by Dr. Andria Frames)  Extremities: no lower extremity edema,  Skin: left lower extremity wound VAC, left flank wound, well-perfused, warm  Brief Hospital Course:  [name] is a 54 y.o. male with a history of ESRD on HD (MWF), PAD, kidney stones, spina bifida, hx of nephrostomy tube, HTN, GERD, anxiety who presented with inability to obtain access at his right femoral dialysis catheter at HD. Hospital course outlined by problem below:  Dialysis access issue I ESRD Patient with longstanding history of ESRD on HD MWF dialysis port catheter in his right femoral, sent to the ED while HD because they were unable to use the catheter for dialysis and with concern that the catheter may be kinked.  Vascular surgery evaluated the patient in the ED and noted no obvious kinks in the external aspect of the catheter.  HD was attempted on 10/1, but was terminated early at the patient request due to  pain.  On 10/2 patient underwent right inferior vena cavogram by Dr. Trula Slade.  He elected not to exchange the catheter.  He did not have any further issues with subsequent hemodialysis during hospitalization.  Infected left thigh wound Patient presented with a draining open wound in the left thigh from a failed left femoral graft.  On 10/2, patient underwent I&D of left thigh with graft excision.  Cultures were sent in the OR.  Culture grew few corynebacterium striatum.  He was on broad-spectrum antibiotics at that time due to concern for septic arthritis.  ID was consulted and antibiotics were narrowed with plan to continue antibiotics with dialysis following discharge.  Wound VAC was placed following the procedure, but had to be removed later that evening because it was not functioning properly.  A wet-to-dry dressing was placed and was exchanged the following morning.  He had the wound VAC replaced again, but this was removed due to his acute knee pain and swelling that developed shortly afterwards, so he was switched back to wet-to-dry dressing.  He had a wound VAC replaced prior to discharge, which remained in place without any difficulties.  Acute left knee pain  ?Septic Joint On the night of 10/3, patient developed acute onset of left knee pain with swelling and warmth.  He shortly became febrile up to 101.3 F.  That night, he underwent joint aspiration of his left knee. Synovial fluid showed 38B WBC (neutrophilic predominance), no crystals.  No organisms seen on Gram stain.  Cultures were negative.  Due to concern for septic arthritis, he was started on broad-spectrum antibiotics and orthopedics was consulted.  On 10/5, patient underwent arthroscopic irrigation and debridement with placement of antibiotic beads and Hemovac drain.  Knee pain was improved prior  to discharge and patient remained afebrile.  Dysphagia Patient has had chronic issue with throat clearing over the past several years.  He  reported worsening dysphagia with food and medications the week prior to arrival.  He had normal oropharyngeal swallow on SLP eval.  Recommended to have ENT eval outpatient.  Atypical chest pain Patient had intermittent, reproducible chest pain with pleuritic component throughout hospitalization.  EKGs were obtained which were NSR. CTA chest was obtained which did not reveal any PE.  He did have a TTE during admission which was unremarkable except for grade 2 diastolic dysfunction, mild LVH, moderate left atrial dilation, trivial mitral valve regurgitation with moderate mitral annular calcification, and severe aortic stenosis.  EF 65 to 70%.  Due to substernal chest pain while working with PT on 10/12, cardiology was consulted.  Troponins obtained were flat and repeat EKG was unremarkable.  Low suspicion for cardiac cause per cardiology. He again had chest pain while working with PT on 10/13. EKG was repeated which was unremarkable. He was evaluated again by cardiology and had a gated cardiac CTA the following day on 10/14, which showed a coronary calcium score of 1158 (99th percentile), heavy diffuse predominantly calcified plaque and at least moderate stenosis in the mid LAD, proximal and distal RCA. On 10/15, patient underwent cardiac catheterization, which showed non-obstructive CAD. Patient was started on Crestor 10 mg. DAPT was restarted by vascular surgery.   Aortic stenosis, severe Patient with known history of heart murmur.  Given symptoms of chest pain and prior history of endocarditis, TTE was ordered which revealed severe aortic stenosis. Gated cardiac CTA also confirmed severe aortic stenosis with calcium score 2882. Discussed case with cardiology, recommended outpatient follow-up.  Not a candidate for AVR during admission given current infection and history of endocarditis.   Anxiety Home medication lorazepam was discontinued during admission.  Patient was started on escitalopram and  hydroxyzine as needed.  Antibiotics Piperacillin-tazobactam (10/4-10/6) Ceftriaxone (10/6-10/7) Vancomycin MWF with HD (10/4-) Ceftazidime (10/8-) Plan to continue vancomycin 1 g and ceftazidime 2 g MWF post-HD until 11/04/19 with need for weekly CBC, CMP, CRP, and pre-dialysis vancomycin level.  Home medications were continued for other chronic diseases which remained stable.    Issues for Follow Up:  1. Patient to follow up with Dr. Angelena Form at [facility] for severe aortic stenosis and atypical chest pain.  2. Consider ENT evaluation for persistent dysphagia.  3. Continue Vancomycin and ceftazidime MWF with dialysis for 4 weeks total course (until 11/2). 4. He will need weekly CBC, CMP, CRP, and predialysis vancomycin level outpatient. 5. Patient started on Lexapro for anxiety.  Titrate this medication as needed.   Significant Procedures:  Left/right heart cath Arthroscopic irrigation and debridement   Significant Labs and Imaging:  Recent Labs  Lab 10/16/19 0244 10/17/19 0719 10/18/19 0301  WBC 5.3 5.3 5.3  HGB 8.8* 8.7* 8.5*  HCT 29.7* 30.2* 29.5*  PLT 179 176 153   Recent Labs  Lab 10/12/19 0500 10/12/19 0500 10/13/19 0143 10/13/19 0143 10/14/19 0306 10/14/19 0306 10/15/19 0256 10/15/19 0256 10/16/19 0244 10/16/19 0244 10/17/19 0725 10/18/19 0301  NA 137   < > 137   < > 136  --  135  --  134*  --  130* 133*  K 4.9   < > 5.5*   < > 5.0   < > 5.4*   < > 5.6*   < > 6.0* 5.4*  CL 100   < > 99   < >  97*  --  97*  --  98  --  93* 97*  CO2 24   < > 24   < > 25  --  26  --  27  --  22 24  GLUCOSE 101*   < > 83   < > 91  --  86  --  93  --  85 102*  BUN 59*   < > 75*   < > 51*  --  73*  --  57*  --  78* 45*  CREATININE 8.78*   < > 10.70*   < > 7.72*  --  9.69*  --  7.92*  --  10.15* 6.88*  CALCIUM 9.1   < > 9.3   < > 9.5  --  9.4  --  9.3  --  9.3 9.1  PHOS  --   --   --   --   --   --   --   --   --   --  7.5* 4.1  ALKPHOS 59  --  68  --  69  --  72  --  72   --   --   --   AST 8*  --  7*  --  10*  --  9*  --  10*  --   --   --   ALT 5  --  6  --  6  --  6  --  7  --   --   --   ALBUMIN 2.8*   < > 2.7*   < > 2.9*  --  2.9*  --  2.9*  --  3.0* 2.9*   < > = values in this interval not displayed.    DG Chest 2 View  Result Date: 10/14/2019 CLINICAL DATA:  Shortness of breath. EXAM: CHEST - 2 VIEW COMPARISON:  Chest radiograph 10/11/2019, 10/06/2019 FINDINGS: Stable cardiomediastinal contours. Central venous congestion without overt edema. Low lung volumes. A few scattered linear opacities left base likely reflect atelectasis. No pneumothorax or large pleural effusion. Diffuse demineralization of the bones. No acute osseous finding. IMPRESSION: Low volume study. Central venous congestion without overt edema. Minimal left basilar atelectasis. Electronically Signed   By: Audie Pinto M.D.   On: 10/14/2019 12:57   DG Chest 2 View  Result Date: 10/11/2019 CLINICAL DATA:  Chest pressure and shortness of breath EXAM: CHEST - 2 VIEW COMPARISON:  Radiograph 10/06/2019 FINDINGS: Low volumes with marked central pulmonary vascular congestion, diffuse hazy reticular interstitial opacities, fissural in septal thickening, and central vascular cuffing. No pneumothorax or visible effusion. Cardiac silhouette appears enlarged from comparison semi upright AP radiograph and may reflect cardiomegaly or pericardial effusion. No pneumothorax. No pleural effusion. Edematous changes seen in the soft tissues. The osseous structures appear diffusely demineralized which may be in keeping with patient's history of renal osteodystrophy although a more lytic appearing lesion is seen involving the right humeral head near the greater tuberosity. IMPRESSION: 1. Findings suggestive of volume overload/CHF. 2. 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 A more focal lytic appearing lesion involving the right humeral head about the greater tuberosity is somewhat more  conspicuous though may be on the spectrum of osteodystrophy and hyperparathyroidism possibly reflecting lesion such as brown tumor. Recommend correlation with patient's symptoms and consideration of dedicated shoulder radiography for more appropriate visualization. These results will be called to the ordering clinician or representative by the Radiologist Assistant, and communication documented in the PACS  or [employer]. Electronically Signed   By: Lovena Le M.D.   On: 10/11/2019 15:31   DG Chest 2 View  Result Date: 10/03/2019 CLINICAL DATA:  Short of breath EXAM: CHEST - 2 VIEW COMPARISON:  12/07/2017 FINDINGS: The heart size and mediastinal contours are within normal limits. Both lungs are clear. The visualized skeletal structures are unremarkable. IMPRESSION: No active cardiopulmonary disease. Electronically Signed   By: Franchot Gallo M.D.   On: 10/03/2019 08:01   DG Shoulder Right  Result Date: 10/11/2019 CLINICAL DATA:  End-stage renal disease, right shoulder lytic lesion EXAM: RIGHT SHOULDER - 2+ VIEW COMPARISON:  CT 10/05/2019 FINDINGS: Evaluation of the right shoulder is slightly limited by suboptimal positioning. There is no definite fracture or dislocation, however. There are multiple faint subcortical lytic lesions demonstrating thin sclerotic margins within the right humeral head. A similar lesion is seen within the distal right clavicle at the acromioclavicular articulation. Given the patient's history of renal insufficiency, these likely represent brown tumors of secondary hyperparathyroidism less likely, these could reflect subchondral cyst in the setting advanced degenerative arthritis. Metastatic disease is not completely excluded, though the multiplicity of lesions only within the humeral head would argue against this. Myeloma typically would not demonstrate the sclerotic margins identified. Visualized right thorax is unremarkable. 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 Correlation with serum parathyroid hormone level may be helpful. Electronically Signed   By: Fidela Salisbury MD   On: 10/11/2019 19:58   CT ANGIO CHEST PE W OR WO CONTRAST  Result Date: 10/05/2019 CLINICAL DATA:  PE suspected, chest pain for a week EXAM: CT ANGIOGRAPHY CHEST WITH CONTRAST TECHNIQUE: Multidetector CT imaging of the chest was performed using the standard protocol during bolus administration of intravenous contrast. Multiplanar CT image reconstructions and MIPs were obtained to evaluate the vascular anatomy. CONTRAST:  31mL OMNIPAQUE IOHEXOL 350 MG/ML SOLN COMPARISON:  None. FINDINGS: Cardiovascular: Satisfactory opacification of the pulmonary arteries to the segmental level. No evidence of pulmonary embolism. Normal heart size. Three-vessel coronary artery calcifications. No pericardial effusion. Aortic valve calcifications. Scattered aortic atherosclerosis. The brachiocephalic veins and superior vena cava are very narrow, with some evidence of internal calcification. There is extensive vascular collateralization about the chest wall. Mediastinum/Nodes: No enlarged mediastinal, hilar, or axillary lymph nodes. Thyroid gland, trachea, and esophagus demonstrate no significant findings. Lungs/Pleura: Lungs are clear. No pleural effusion or pneumothorax. Upper Abdomen: No acute abnormality. Musculoskeletal: No chest wall abnormality.  Renal osteodystrophy. Review of the MIP images confirms the above findings. IMPRESSION: 1. Negative examination for pulmonary embolism. 2. The brachiocephalic veins and superior vena cava are very narrow, with some evidence of internal calcification. There is extensive vascular collateralization about the chest wall. 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 3.  Coronary artery disease. 4. Aortic valve calcifications. Aortic Atherosclerosis (ICD10-I70.0). 5. Renal osteodystrophy. Electronically Signed   By: Eddie Candle M.D.   On: 10/05/2019 15:36   CARDIAC CATHETERIZATION  Result Date: 10/17/2019  Mid LAD lesion is 25% stenosed.  1st RPL lesion is 25% stenosed.  Prox Cx to Mid Cx lesion is 25% stenosed.  LV end diastolic pressure is normal.  There is severe aortic valve stenosis. Mean gradient 34 mm Hg.  Unable to access left femoral vein.  Continue aggressive preventive therapy. Management of aortic stenosis per TAVR team.  CT CORONARY MORPH W/CTA COR W/SCORE W/CA W/CM &/OR WO/CM  Addendum Date: 10/16/2019   ADDENDUM REPORT: 10/16/2019 17:10 CLINICAL DATA:  54 year old male with aortic stenosis and ESRD. EXAM: Cardiac/Coronary  CTA TECHNIQUE: The patient was scanned on a [employer]. FINDINGS: A 100 kV prospective scan was triggered in the descending thoracic aorta at 111 HU's. Axial non-contrast 3 mm slices were carried out through the heart. The data set was analyzed on a dedicated work station and scored using the [place]. Gantry rotation speed was 250 msecs and collimation was .6 mm. No beta blockade and 0.8 mg of sl NTG was given. The 3D data set was reconstructed in 5% intervals of the 67-82 % of the R-R cycle. Diastolic phases were analyzed on a dedicated work station using MPR, MIP and VRT modes. The patient received 80 cc of contrast. Aorta:  Normal size.  Mild atherosclerotic plaque. No dissection. Aortic Valve: Trileaflet with severely calcified leaflets and severely restricted leaflet opening. Aortic valve calcium score 2882 consistent with severe aortic stenosis. Coronary Arteries:  Normal coronary origin.  Right dominance. RCA is a large dominant artery that gives rise to PDA and PLA. Proximal RCA has severe mixed, predominantly calcified plaque with stenosis at least 50-69%. Mid RCA has diffuse mild plaque. Distal RCA has  severe mixed diffuse plaque that is possibly > 70%. PDA and PLA are not interpretable. Left main is a large artery that gives rise to LAD, ramus intermedius and LCX arteries. Left main has minimal plaque. LAD is a large vessel that gives rise to one diagonal artery. Proximal LAD has minimal plaque. Mid LAD has a long segment of severe calcified plaque with stenosis at least 50-69%, but possible > 70%. Distal LAD has only mild plaque. Ramus intermedius is a small lumen artery with mild diffuse plaque. LCX is a small lumen non-dominant artery that gives rise to one small OM1 branch. There is at least moderate calcified plaque in the proximal LCX artery with stenosis 50-69%. OM1 is rather small artery with at least moderate calcified plaque with stenosis 50-69%. Other findings: Normal pulmonary vein drainage into the left atrium. Normal left atrial appendage without a thrombus. Normal size of the pulmonary artery. IMPRESSION: 1. Coronary calcium score of 4158. This was 31 percentile for age and sex matched control. 2. Normal coronary origin with right dominance. 3. Very challenging study, interpretation effected by arrhythmia during acquisition, patient's size (BMI 36) and severe calcifications. There is heavy diffuse predominantly calcified plaque and at least moderate stenosis in the mid LAD, proximal and distal RCA. Cardiac catheterization or CT FFR is recommended. Consider symptom-guided anti-ischemic pharmacotherapy as well as risk factor modification per guideline directed care. 4. Trileaflet aortic valve with severely calcified leaflets and severely restricted leaflet opening. Aortic valve calcium score 2882 consistent with severe aortic stenosis. 5. Severe extensive mitral annular calcifications. Electronically Signed   By: Ena Dawley   On: 10/16/2019 17:10   Result Date: 10/16/2019 EXAM: OVER-READ INTERPRETATION  CT CHEST The following report is an over-read performed by radiologist Dr. Vinnie Langton of [facility] [place] on 10/16/2019. This over-read does not include interpretation of cardiac or coronary anatomy or pathology. The coronary calcium score/coronary CTA interpretation by the cardiologist is attached. COMPARISON:  None. FINDINGS: Within the visualized portions of the thorax there are no suspicious appearing pulmonary nodules or masses, there is no acute consolidative airspace disease, no pleural effusions, no pneumothorax and no lymphadenopathy. Visualized portions of the upper abdomen  are unremarkable. There are no aggressive appearing lytic or blastic lesions noted in the visualized portions of the skeleton. IMPRESSION: No significant incidental noncardiac findings are noted. Electronically Signed: By: Vinnie Langton M.D. On: 10/16/2019 13:30   DG CHEST PORT 1 VIEW  Result Date: 10/06/2019 CLINICAL DATA:  Fever EXAM: PORTABLE CHEST 1 VIEW COMPARISON:  Radiograph from 3 days ago.  Chest CT from yesterday. FINDINGS: Interstitial prominence. No consolidation, Kerley lines, effusion, or pneumothorax. Normal heart size and mediastinal contours. Prominent osteopenia IMPRESSION: Stable low volume chest.  No focal pneumonia. Electronically Signed   By: Monte Fantasia M.D.   On: 10/06/2019 06:17   DG Knee Complete 4 Views Left  Result Date: 10/05/2019 CLINICAL DATA:  Pain EXAM: LEFT KNEE - COMPLETE 4+ VIEW COMPARISON:  None. FINDINGS: There are extensive tricompartmental degenerative changes, greatest within the lateral and patellofemoral compartments. There are large subchondral cystic changes throughout the patella. There is a large suprapatellar joint effusion. There is soft tissue swelling about the knee. There is osteopenia which limits detection of nondisplaced fractures. There is no acute displaced fracture or dislocation. Advanced vascular calcifications are noted. IMPRESSION: 1. No acute displaced fracture or dislocation. 2. Extensive tricompartmental degenerative changes,  greatest within the lateral and patellofemoral compartments. 3. Large suprapatellar joint effusion. 4. Osteopenia. Electronically Signed   By: Constance Holster M.D.   On: 10/05/2019 22:43   ECHOCARDIOGRAM COMPLETE  Result Date: 10/05/2019    ECHOCARDIOGRAM REPORT   Patient Name:   KHALFANI WEIDEMAN Date of Exam: 10/05/2019 Medical Rec #:  [id number]        Height:       71.0 in Accession #:    [id number]       Weight:       256.4 lb Date of Birth:  10-May-1965       BSA:          2.343 m Patient Age:    32 years         BP:           126/81 mmHg Patient Gender: M                HR:           67 bpm. Exam Location:  Inpatient Procedure: 2D Echo, Color Doppler and Cardiac Doppler Indications:    R01.1 Murmur  History:        Patient has prior history of Echocardiogram examinations, most                 recent 12/14/2017.  Sonographer:    Merrie Roof RDCS Referring Phys: [place]  1. Left ventricular ejection fraction, by estimation, is 65 to 70%. The left ventricle has normal function. The left ventricle has no regional wall motion abnormalities. There is mild left ventricular hypertrophy. Left ventricular diastolic parameters are consistent with Grade II diastolic dysfunction (pseudonormalization).  2. Right ventricular systolic function is normal. The right ventricular size is normal. Tricuspid regurgitation signal is inadequate for assessing PA pressure.  3. Left atrial size was moderately dilated.  4. The mitral valve is abnormal. Trivial mitral valve regurgitation. Moderate mitral annular calcification.  5. The aortic valve is functionally bicuspid. There is moderate calcification of the aortic valve and evidence of severe stenosis. Aortic valve mean gradient measures 37.2 mmHg. Aortic valve Vmax measures 3.88 m/s. Dimentionless index 0.25  6. The inferior vena cava is normal in size with greater than 50% respiratory variability, suggesting  right atrial pressure of 3 mmHg. FINDINGS   Left Ventricle: Left ventricular ejection fraction, by estimation, is 65 to 70%. The left ventricle has normal function. The left ventricle has no regional wall motion abnormalities. The left ventricular internal cavity size was normal in size. There is  mild left ventricular hypertrophy. Left ventricular diastolic parameters are consistent with Grade II diastolic dysfunction (pseudonormalization). Right Ventricle: The right ventricular size is normal. No increase in right ventricular wall thickness. Right ventricular systolic function is normal. Tricuspid regurgitation signal is inadequate for assessing PA pressure. Left Atrium: Left atrial size was moderately dilated. Right Atrium: Right atrial size was normal in size. Pericardium: There is no evidence of pericardial effusion. Mitral Valve: The mitral valve is abnormal. There is mild calcification of the mitral valve leaflet(s). Moderate mitral annular calcification. Trivial mitral valve regurgitation. Tricuspid Valve: The tricuspid valve is grossly normal. Tricuspid valve regurgitation is trivial. Aortic Valve: The aortic valve is bicuspid. There is moderate calcification of the aortic valve. There is mild to moderate aortic valve annular calcification. Aortic valve regurgitation is not visualized. Severe aortic stenosis is present. Aortic valve mean gradient measures 37.2 mmHg. Aortic valve peak gradient measures 60.1 mmHg. Aortic valve area, by VTI measures 0.77 cm. Pulmonic Valve: The pulmonic valve was grossly normal. Pulmonic valve regurgitation is trivial. Aorta: The aortic root is normal in size and structure. Venous: The inferior vena cava is normal in size with greater than 50% respiratory variability, suggesting right atrial pressure of 3 mmHg. IAS/Shunts: No atrial level shunt detected by color flow Doppler.  LEFT VENTRICLE PLAX 2D LVIDd:         5.00 cm     Diastology LVIDs:         3.60 cm     LV e' medial:    4.47 cm/s LV PW:         1.20 cm     LV  E/e' medial:  30.9 LV IVS:        1.20 cm     LV e' lateral:   6.20 cm/s LVOT diam:     2.00 cm     LV E/e' lateral: 22.3 LV SV:         65 LV SV Index:   28 LVOT Area:     3.14 cm  LV Volumes (MOD) LV vol d, MOD A4C: 76.5 ml LV vol s, MOD A4C: 34.2 ml LV SV MOD A4C:     76.5 ml RIGHT VENTRICLE RV Basal diam:  3.30 cm LEFT ATRIUM              Index       RIGHT ATRIUM           Index LA diam:        5.20 cm  2.22 cm/m  RA Area:     15.50 cm LA Vol (A2C):   117.0 ml 49.93 ml/m RA Volume:   35.70 ml  15.23 ml/m LA Vol (A4C):   109.0 ml 46.51 ml/m LA Biplane Vol: 118.0 ml 50.36 ml/m  AORTIC VALVE AV Area (Vmax):    0.94 cm AV Area (Vmean):   0.89 cm AV Area (VTI):     0.77 cm AV Vmax:           387.75 cm/s AV Vmean:          290.500 cm/s AV VTI:            0.844 m AV Peak Grad:  60.1 mmHg AV Mean Grad:      37.2 mmHg LVOT Vmax:         116.00 cm/s LVOT Vmean:        82.100 cm/s LVOT VTI:          0.207 m LVOT/AV VTI ratio: 0.25  AORTA Ao Root diam: 3.50 cm Ao Asc diam:  3.40 cm MITRAL VALVE MV Area (PHT): 2.17 cm     SHUNTS MV Decel Time: 349 msec     Systemic VTI:  0.21 m MV E velocity: 138.00 cm/s  Systemic Diam: 2.00 cm MV A velocity: 104.00 cm/s MV E/A ratio:  1.33 Rozann Lesches MD Electronically signed by Rozann Lesches MD Signature Date/Time: 10/05/2019/3:14:51 PM    Final    CT RENAL STONE STUDY  Result Date: 10/06/2019 CLINICAL DATA:  Left lower quadrant pain. Leg drainage from prior nephrostomy tube site. New fever. EXAM: CT ABDOMEN AND PELVIS WITHOUT CONTRAST TECHNIQUE: Multidetector CT imaging of the abdomen and pelvis was performed following the standard protocol without IV contrast. COMPARISON:  CT dated March 20, 2019 FINDINGS: Lower chest: The lung bases are clear. The heart size is normal. Hepatobiliary: The liver is normal. Normal gallbladder.There is no biliary ductal dilation. Pancreas: Normal contours without ductal dilatation. No peripancreatic fluid collection. Spleen: The  spleen is significantly enlarged measuring approximately 15 cm craniocaudad. Adrenals/Urinary Tract: --Adrenal glands: Unremarkable. --Right kidney/ureter: The right kidney is atrophic with multiple nonobstructing stones. --Left kidney/ureter: The left kidney is atrophic. The previously demonstrated for ostomy tube has been removed. There is a tract from the left kidney to the patient's skin surface on the left flank. This may represent a draining sinus tract. Chronic inflammatory changes are noted in the left retroperitoneum about the left kidney. --Urinary bladder: The urinary bladder is decompressed and therefore is poorly evaluated. Stomach/Bowel: --Stomach/Duodenum: No hiatal hernia or other gastric abnormality. Normal duodenal course and caliber. --Small bowel: There is a right lower quadrant ileostomy. --Colon: There is a large amount of stool in the colon, especially at the level of the rectum. --Appendix: Normal. Vascular/Lymphatic: There is a right femoral approach tunneled dialysis catheter with tip terminating at the level of the suprarenal IVC. There is a left common iliac stent in place. The infrarenal IVC is atretic and may be chronically occluded as evidence by multiple collateral veins coursing through the patient's anterior abdomen. Atherosclerotic changes are noted throughout the abdominal aorta without evidence for an aneurysm. A left femoral dialysis graft is again noted and likely chronically occluded. --No retroperitoneal lymphadenopathy. --No mesenteric lymphadenopathy. --No pelvic or inguinal lymphadenopathy. Reproductive: Unremarkable Other: No ascites or free air. There is mild body wall edema. Musculoskeletal. There is renal osteodystrophy. Chronic cystic changes are noted of the bilateral femoral heads. IMPRESSION: 1. Status post removal of the previously demonstrated left-sided nephrostomy tube. There is a sinus tract coursing from the left kidney to the patient's left flank which may  explain the patient's symptoms. There is no well-formed drainable fluid collection. 2. Multiple additional chronic findings are noted as detailed above. Aortic Atherosclerosis (ICD10-I70.0). Electronically Signed   By: Constance Holster M.D.   On: 10/06/2019 01:47       Results/Tests Pending at Time of Discharge:   Discharge Medications:  Allergies as of 10/18/2019      Reactions   Hydrocodone Other (See Comments)   Caused involuntary movement and twitching. CANNOT TAKE DUE TO MUSCLE SPASMS AND MUSCLE TREMORS   Colace [docusate] Diarrhea   Furadantin [nitrofurantoin]  Other (See Comments)   UNSPECIFIED REACTION    Mandelamine [methenamine] Other (See Comments)   UNSPECIFIED REACTION    Noroxin [norfloxacin] Other (See Comments)   UNSPECIFIED REACTION    Sulfa Antibiotics Other (See Comments), Cough   Childhood reaction - pt could not confirm that it was a cough   Sulfur Cough   Childhood reaction - pt could not confirm that it was a cough      Medication List    TAKE these medications   aspirin 325 MG tablet Take 325 mg by mouth daily.   calcium acetate 667 MG capsule Commonly known as: PHOSLO Take 3-4 capsules (2,001-2,668 mg total) by mouth See admin instructions. Take 2708 mg by mouth 3 times daily with meals, take 2001 mg by mouth with snacks What changed: additional instructions   cefTAZidime 2 g in sodium chloride 0.9 % 100 mL Inject 2 g into the vein every Monday, Wednesday, and Friday with hemodialysis. Start taking on: October 20, 2019   clopidogrel 75 MG tablet Commonly known as: PLAVIX Take 75 mg by mouth daily.   escitalopram 5 MG tablet Commonly known as: LEXAPRO Take 1 tablet (5 mg total) by mouth daily. Start taking on: October 19, 2019   famotidine 20 MG tablet Commonly known as: PEPCID Take 1 tablet (20 mg total) by mouth daily.   fluticasone 50 MCG/ACT nasal spray Commonly known as: FLONASE Place 2 sprays into both nostrils as needed for  allergies or rhinitis (congestion). What changed: reasons to take this   furosemide 80 MG tablet Commonly known as: LASIX Take 80 mg by mouth 2 (two) times daily as needed (fluid).   levothyroxine 137 MCG tablet Commonly known as: SYNTHROID Take 137 mcg by mouth daily before breakfast. What changed: Another medication with the same name was removed. Continue taking this medication, and follow the directions you see here.   LORazepam 1 MG tablet Commonly known as: ATIVAN Take 1 mg by mouth daily as needed (anxiety).   Nepro Liqd Take 237 mLs by mouth daily. Mixed Berry   oxyCODONE-acetaminophen 10-325 MG tablet Commonly known as: PERCOCET Take 1 tablet by mouth every 6 (six) hours as needed for pain.   promethazine 25 MG tablet Commonly known as: PHENERGAN Take 1 tablet (25 mg total) by mouth 2 (two) times daily as needed for nausea or vomiting. What changed: when to take this   rosuvastatin 10 MG tablet Commonly known as: CRESTOR Take 1 tablet (10 mg total) by mouth daily.   sodium polystyrene powder Commonly known as: KAYEXALATE Take 15 g by mouth See admin instructions. Take 15 g (mixed in water) by mouth on Saturdays and Sundays   traZODone 100 MG tablet Commonly known as: DESYREL Take 1 tablet (100 mg total) by mouth at bedtime.   vancomycin 1-5 GM/200ML-% Soln Commonly known as: VANCOCIN Inject 200 mLs (1,000 mg total) into the vein every Monday, Wednesday, and Friday with hemodialysis. Start taking on: October 20, 2019   zolpidem 10 MG tablet Commonly known as: AMBIEN Take 1 tablet (10 mg total) by mouth at bedtime.            Durable Medical Equipment  (From admission, onward)         Start     Ordered   10/16/19 1616  For home use only DME 3 n 1  Once        10 /14/21 1617   10/16/19 1056  For home use only DME Walker rolling  Once       Comments: Needs bariatric walker  Question Answer Comment  Walker: With [place]   Patient needs a  walker to treat with the following condition Left knee pain      10/16/19 1056           Discharge Care Instructions  (From admission, onward)         Start     Ordered   10/18/19 0000  Discharge wound care:       Comments: Follow up with vascular surgery who will manage wound vac   10/18/19 1423          Discharge Instructions: Please refer to Patient Instructions section of EMR for full details.  Patient was counseled important signs and symptoms that should prompt return to medical care, changes in medications, dietary instructions, activity restrictions, and follow up appointments.   Follow-Up Appointments:  Follow-up Information    Serafina Mitchell, MD Follow up.   Specialties: Vascular Surgery, Cardiology Why: the office will call you to arrange follow-up (sent) Contact information: [place] [address] [place] Follow up.   Specialty: Home Health Services Contact information: [place] [address] [phone]        Charlie Pitter, PA-C Follow up on 11/04/2019.   Specialties: Cardiology, Radiology Why: at 3:45pm for your follow up appt Contact information: [address] [place] [address] [place] Follow up.   Why: rolling walker , 3 n 1  Contact information: [address] [place] [address] [phone]               Lyndee Hensen, DO 10/18/2019, 5:17 PM PGY-2, [place]

## 2019-10-18 NOTE — Discharge Instructions (Signed)
Dear [name],   Thank you so much for allowing [place] to be part of your care!  You were admitted to [facility] for inability to obtain access through your dialysis catheter and a wound on your left thigh from your exposed dialysis catheter. This required surgery by a vascular surgeon and a wound vac was placed.  You also developed knee pain and swelling with fever, so there was concern for infection in your joint, so we started you on antibiotics.  Cultures of your knee did not grow any bacteria however there was bacterial growth in your left thigh wound, so your antibiotics were changed to target this organism.  For your chest pain, we obtained blood work, EKGs, two different CT scans of your chest, and an echocardiogram.  You did not have any signs of heart attack, but it does appear that when your valves has worsened.  Due to your infection, it is not safe to operate on the valve.  This will be a discussion with Dr. Angelena Form at your next appointment with him.  POST-HOSPITAL & CARE INSTRUCTIONS 1. Keep the affected area clean and dry. Please follow up in the outpatient office.  2. Please let PCP/Specialists know of any changes that were made.  3. Please see medications section of this packet for any medication changes.  4. Continue Vancomycin and ceftazidime MWF with dialysis for 4 weeks total course (until 11/2). 5. You will need weekly CBC, CMP, CRP, and predialysis vancomycin level outpatient.  DOCTOR'S APPOINTMENT & FOLLOW UP CARE INSTRUCTIONS  Future Appointments  Date Time Provider [place]  01/15/2020  4:00 PM Burnell Blanks, MD [facility]    RETURN PRECAUTIONS: Please return if you have further issues with your dialysis catheter or if you have any redness, swelling, drainage, or other signs of infection at your wound site.  Take care and be well!  [facility]  [place], [place] [address] [phone]

## 2019-10-18 NOTE — TOC Transition Note (Signed)
Transition of Care [facility]) - CM/SW Discharge Note   Patient Details  Name: [name] MRN: [id number] Date of Birth: [birth year]  [facility]) CM/SW Contact:  Zenon Mayo, RN Phone Number: 10/18/2019, 11:42 AM   Clinical Narrative:    Patient is for dc today, his sister will be here to transport him home around 3 pm today, NCM informed MD of this information.  NCM also notified Amy with [facility].     Final next level of care: Home w Home Health Services Barriers to Discharge: No Barriers Identified   Patient Goals and CMS Choice Patient states their goals for this hospitalization and ongoing recovery are:: to go home      Discharge Placement                       Discharge Plan and Services   Discharge Planning Services: CM Consult Post Acute Care Choice: Home Health, Durable Medical Equipment          DME Arranged: Vac DME Agency: [facility] Date DME Agency Contacted: 10/04/19 Time DME Agency Contacted: 0930 Representative spoke with at DME Agency: [facility] HH Arranged: RN, Nurse's Aide [place] Agency: [facility] [place] Date [place]: 10/04/19 Time [place]: 3846 Representative spoke with at [place]: [place] ([place]) Interventions     Readmission Risk Interventions No flowsheet data found.

## 2019-10-18 NOTE — Progress Notes (Signed)
Subjective: Seen in room, no shortness of breath no chest pain, noted  cardiac cath minimal disease. said tolerated dialysis yesterday. Noted bag of potato chips at bedside discussed high potassium diet restrictions.  Objective Vital signs in last 24 hours: Vitals:   10/17/19 1840 10/17/19 2023 10/18/19 0455 10/18/19 0843  BP: (!) 104/57 (!) 90/45 (!) 111/92 (!) 127/54  Pulse:  68 (!) 57 64  Resp:  16 15 16   Temp:  98.8 F (37.1 C) 98.9 F (37.2 C) 98.4 F (36.9 C)  TempSrc:  Oral Axillary Oral  SpO2:  100% 100% 97%  Weight:   120 kg   Height:       Weight change: -0.4 kg  Physical Exam: General:Alert, chronically ill appearing obese, male NAD Heart:RRR, +8/7 systolic murmur Lungs: CTA, bilaterally, nonlabored Abdomen: soft, Mild L side tenderness , no rebound  Extremities:trace LE edema, L knee wrapped, L thigh wound dressed Dialysis Access: R fem TDC   OP [facility] Optiflux 200NRe, Time: 4hr 70min, BFR 350, DFR 800, EDW 114.5kg, 2K/2.,25Ca, TDC heparin 3700 unit bolus Mircera 226mcg IV q 2 weeks- last dose 9/27 Venofer 100mg  q HD -last dose due today Phoslo 4 caps TO TID with meals and 3 with snacks Hectorol 3 mcg IV q HD Kayexalate 15g PO on non-dialysis days OP   Problem/Plan: 1.  ESRD:HD MWF. S/p [facility] manipulation by Dr. Trula Slade 10/2 as well as L thigh debridement with old AVG removal.TDC improved on HD  since intervention, giving cath flo post each HD. Access options limited.Next HD Monday 10/18. 2. Hyperkalemia:A.m. K5.4, recurrent issue /this a.m. discussed high potassium diet restrictions again-pt takes kayexalate on non-dialysis days --in hospital changed to lokelma d/t issues with diarrhea.He was to continue his present Kayexalate supply at home till finishes then change over to [facility] 3. Unstable angia /CAD =Card cath 10/15= "minimal disease" eval unstable angina =had  "gated  Coronary CTA  10/14 3 V CAD"  plans per cardiology 4. Aortic stenosis as noted = follow-up with cardiology as outpatient  5. L thigh wound/infected old AVG: S/p excision/debridement 10/2-  Wound culture growing few corynebacterium striatum. ID consulted, Per ID continueIVVanc/Fortaz TIW with HD until 11/2  6. L knee pain/effusion - s/p joint aspiration 10/4. Concern for septic arthrits 25K WBCs insynovialfluid. To OR for I &D 10/5 --Culturesneg to date. 7. Hypertension/volume:BPlow/stable.Not getting to EDW, 2l  UF today tolerated. CXR shows volume overload, lungs sound clear. Reinforced fluid restrictions. Now at outpatient dry weight. Recent wt= bed weight unreliable 8. Anemia:Hgb 8.7> 8.5. Aranesp 200 dosed 56/43.  9. Metabolic bone disease:Corrected Ca sl ^ /Phos 7.5> 4.1  Continue hectorol and phoslo binder. 10. Nutrition:Renal diet with 1.2L fluid restriction recommended. Prot supp for low alb.  11. L flank wound:D/t prior PCN tube - some erythema, no drainage - pt reports this is improved, follow closely. 12 Diarrhea  Improved -D/ccolace and chronic Kayexalate use as op   Ernest Haber, PA-C [facility] [phone] 10/18/2019,11:38 AM  LOS: 14 days   Labs: Basic Metabolic Panel: Recent Labs  Lab 10/16/19 0244 10/17/19 0725 10/18/19 0301  NA 134* 130* 133*  K 5.6* 6.0* 5.4*  CL 98 93* 97*  CO2 27 22 24   GLUCOSE 93 85 102*  BUN 57* 78* 45*  CREATININE 7.92* 10.15* 6.88*  CALCIUM 9.3 9.3 9.1  PHOS  --  7.5* 4.1   Liver Function Tests: Recent Labs  Lab 10/14/19 0306 10/14/19 0306  10/15/19 0256 10/15/19 0256 10/16/19 0244 10/17/19 0725 10/18/19 0301  AST 10*  --  9*  --  10*  --   --   ALT 6  --  6  --  7  --   --   ALKPHOS 69  --  72  --  72  --   --   BILITOT 0.3  --  0.4  --  0.7  --   --   PROT 7.6  --  7.3  --  7.2  --   --   ALBUMIN 2.9*   < > 2.9*   < > 2.9* 3.0* 2.9*   < > = values in this interval not displayed.   No results for input(s):  LIPASE, AMYLASE in the last 168 hours. No results for input(s): AMMONIA in the last 168 hours. CBC: Recent Labs  Lab 10/11/19 2230 10/12/19 0500 10/14/19 0306 10/14/19 0306 10/15/19 0256 10/15/19 0256 10/16/19 0244 10/17/19 0719 10/18/19 0301  WBC 5.0   < > 5.4   < > 5.1   < > 5.3 5.3 5.3  NEUTROABS 3.5  --   --   --   --   --   --   --   --   HGB 9.2*   < > 8.8*   < > 8.4*   < > 8.8* 8.7* 8.5*  HCT 31.0*   < > 29.4*   < > 28.6*   < > 29.7* 30.2* 29.5*  MCV 98.4   < > 98.3  --  98.3  --  100.3* 99.7 100.0  PLT 181   < > 196   < > 179   < > 179 176 153   < > = values in this interval not displayed.   Cardiac Enzymes: No results for input(s): CKTOTAL, CKMB, CKMBINDEX, TROPONINI in the last 168 hours. CBG: No results for input(s): GLUCAP in the last 168 hours.  Studies/Results: CARDIAC CATHETERIZATION  Result Date: 10/17/2019  Mid LAD lesion is 25% stenosed.  1st RPL lesion is 25% stenosed.  Prox Cx to Mid Cx lesion is 25% stenosed.  LV end diastolic pressure is normal.  There is severe aortic valve stenosis. Mean gradient 34 mm Hg.  Unable to access left femoral vein.  Continue aggressive preventive therapy. Management of aortic stenosis per TAVR team.   CT CORONARY MORPH W/CTA COR W/SCORE W/CA W/CM &/OR WO/CM  Addendum Date: 10/16/2019   ADDENDUM REPORT: 10/16/2019 17:10 CLINICAL DATA:  54 year old male with aortic stenosis and ESRD. EXAM: Cardiac/Coronary  CTA TECHNIQUE: The patient was scanned on a [employer]. FINDINGS: A 100 kV prospective scan was triggered in the descending thoracic aorta at 111 HU's. Axial non-contrast 3 mm slices were carried out through the heart. The data set was analyzed on a dedicated work station and scored using the [place]. Gantry rotation speed was 250 msecs and collimation was .6 mm. No beta blockade and 0.8 mg of sl NTG was given. The 3D data set was reconstructed in 5% intervals of the 67-82 % of the R-R cycle. Diastolic  phases were analyzed on a dedicated work station using MPR, MIP and VRT modes. The patient received 80 cc of contrast. Aorta:  Normal size.  Mild atherosclerotic plaque. No dissection. Aortic Valve: Trileaflet with severely calcified leaflets and severely restricted leaflet opening. Aortic valve calcium score 2882 consistent with severe aortic stenosis. Coronary Arteries:  Normal coronary origin.  Right dominance. RCA is a large dominant artery  that gives rise to PDA and PLA. Proximal RCA has severe mixed, predominantly calcified plaque with stenosis at least 50-69%. Mid RCA has diffuse mild plaque. Distal RCA has severe mixed diffuse plaque that is possibly > 70%. PDA and PLA are not interpretable. Left main is a large artery that gives rise to LAD, ramus intermedius and LCX arteries. Left main has minimal plaque. LAD is a large vessel that gives rise to one diagonal artery. Proximal LAD has minimal plaque. Mid LAD has a long segment of severe calcified plaque with stenosis at least 50-69%, but possible > 70%. Distal LAD has only mild plaque. Ramus intermedius is a small lumen artery with mild diffuse plaque. LCX is a small lumen non-dominant artery that gives rise to one small OM1 branch. There is at least moderate calcified plaque in the proximal LCX artery with stenosis 50-69%. OM1 is rather small artery with at least moderate calcified plaque with stenosis 50-69%. Other findings: Normal pulmonary vein drainage into the left atrium. Normal left atrial appendage without a thrombus. Normal size of the pulmonary artery. IMPRESSION: 1. Coronary calcium score of 4158. This was 65 percentile for age and sex matched control. 2. Normal coronary origin with right dominance. 3. Very challenging study, interpretation effected by arrhythmia during acquisition, patient's size (BMI 36) and severe calcifications. There is heavy diffuse predominantly calcified plaque and at least moderate stenosis in the mid LAD, proximal and  distal RCA. Cardiac catheterization or CT FFR is recommended. Consider symptom-guided anti-ischemic pharmacotherapy as well as risk factor modification per guideline directed care. 4. Trileaflet aortic valve with severely calcified leaflets and severely restricted leaflet opening. Aortic valve calcium score 2882 consistent with severe aortic stenosis. 5. Severe extensive mitral annular calcifications. Electronically Signed   By: Ena Dawley   On: 10/16/2019 17:10   Result Date: 10/16/2019 EXAM: OVER-READ INTERPRETATION  CT CHEST The following report is an over-read performed by radiologist Dr. Vinnie Langton of [facility], [place] on 10/16/2019. This over-read does not include interpretation of cardiac or coronary anatomy or pathology. The coronary calcium score/coronary CTA interpretation by the cardiologist is attached. COMPARISON:  None. FINDINGS: Within the visualized portions of the thorax there are no suspicious appearing pulmonary nodules or masses, there is no acute consolidative airspace disease, no pleural effusions, no pneumothorax and no lymphadenopathy. Visualized portions of the upper abdomen are unremarkable. There are no aggressive appearing lytic or blastic lesions noted in the visualized portions of the skeleton. IMPRESSION: No significant incidental noncardiac findings are noted. Electronically Signed: By: Vinnie Langton M.D. On: 10/16/2019 13:30   Medications: . sodium chloride    . cefTAZidime (FORTAZ)  IV 2 g (10/17/19 0916)  . vancomycin Stopped (10/17/19 1145)   . acetaminophen  1,000 mg Oral Q8H  . aspirin EC  325 mg Oral Daily  . calcium acetate  2,001 mg Oral With snacks  . calcium acetate  2,668 mg Oral TID with meals  . Chlorhexidine Gluconate Cloth  6 each Topical Q0600  . clopidogrel  75 mg Oral Daily  . darbepoetin (ARANESP) injection - DIALYSIS  200 mcg Intravenous Q Mon-HD  . doxercalciferol  3 mcg Intravenous Q M,W,F-HD  . escitalopram  5 mg Oral  Daily  . famotidine  10 mg Oral Q M,W,F-2000  . heparin  5,000 Units Subcutaneous Q8H  . levothyroxine  137 mcg Oral QAC breakfast  . lidocaine  2 patch Transdermal Daily  . psyllium  1 packet Oral Daily  . sodium chloride flush  3 mL Intravenous Q12H  . sodium zirconium cyclosilicate  10 g Oral QODAY

## 2019-10-20 ENCOUNTER — Encounter (HOSPITAL_COMMUNITY): Payer: Self-pay | Admitting: Interventional Cardiology

## 2019-10-20 ENCOUNTER — Telehealth: Payer: Self-pay | Admitting: Nephrology

## 2019-10-20 DIAGNOSIS — A499 Bacterial infection, unspecified: Secondary | ICD-10-CM | POA: Insufficient documentation

## 2019-10-20 LAB — HEMOGLOBIN A1C
Hgb A1c MFr Bld: 4.5 % — ABNORMAL LOW (ref 4.8–5.6)
Mean Plasma Glucose: 82 mg/dL

## 2019-10-20 MED FILL — Lidocaine HCl Local Preservative Free (PF) Inj 1%: INTRAMUSCULAR | Qty: 30 | Status: AC

## 2019-10-20 NOTE — Telephone Encounter (Signed)
Transition of care contact from inpatient facility  Date of discharge: 10/16 Date of contact: 10/18 Method: Phone Spoke to: Patient  Patient contacted to discuss transition of care from recent inpatient hospitalization. Patient was admitted to [facility] from 10/1- 10/18/19 with discharge diagnosis of malfunction of dialysis access, infected thigh wound , septic arthritis.  Medication changes were reviewed.  Patient will follow up with his/her outpatient HD unit on: He went to dialysis today 10/18. Feels he may need dry weight adjusted.

## 2019-10-21 ENCOUNTER — Telehealth: Payer: Self-pay

## 2019-10-21 ENCOUNTER — Ambulatory Visit: Payer: Medicare Other

## 2019-10-21 NOTE — Telephone Encounter (Signed)
Patient's nurse Maudie Mercury called to report the wound vac had clotted 3x in one hour and was producing bright red blood and clots. Spoke with patient and put him on the schedule for a wound check tomorrow. He will do wet to dry dressings until then and bring in his wound vac. He said there wasn't much drainage unless the wound vac was on. Told him to bring wound vac machine with him. Patient verbalized understanding.

## 2019-10-22 ENCOUNTER — Ambulatory Visit (INDEPENDENT_AMBULATORY_CARE_PROVIDER_SITE_OTHER): Payer: Self-pay | Admitting: Physician Assistant

## 2019-10-22 ENCOUNTER — Other Ambulatory Visit: Payer: Self-pay

## 2019-10-22 VITALS — BP 97/64 | HR 67 | Temp 98.3°F | Resp 20 | Ht 71.0 in | Wt 259.0 lb

## 2019-10-22 DIAGNOSIS — Z992 Dependence on renal dialysis: Secondary | ICD-10-CM

## 2019-10-22 DIAGNOSIS — N186 End stage renal disease: Secondary | ICD-10-CM

## 2019-10-22 NOTE — Progress Notes (Signed)
POST OPERATIVE OFFICE NOTE    CC:  F/u for surgery  HPI:  This is a 54 y.o. male who is s/p excision left thigh AVG, inferior venocavogram.  He is catheter dependent and has a right femoral TDC.  No fever or chills. He states the wound VAC on his left thigh was recently changed multiple times due to bleeding. He is on Plavix daily. No reports of lower extremity pain. He arrives in wheelchair.  Allergies  Allergen Reactions  . Hydrocodone Other (See Comments)    Caused involuntary movement and twitching. CANNOT TAKE DUE TO MUSCLE SPASMS AND MUSCLE TREMORS  . Colace [Docusate] Diarrhea  . Furadantin [Nitrofurantoin] Other (See Comments)    UNSPECIFIED REACTION   . Mandelamine [Methenamine] Other (See Comments)    UNSPECIFIED REACTION   . Noroxin [Norfloxacin] Other (See Comments)    UNSPECIFIED REACTION   . Other Other (See Comments)  . Sulfa Antibiotics Other (See Comments) and Cough    Childhood reaction - pt could not confirm that it was a cough  . Sulfur Cough    Childhood reaction - pt could not confirm that it was a cough    Current Outpatient Medications  Medication Sig Dispense Refill  . aspirin 325 MG tablet Take 325 mg by mouth daily.    . calcium acetate (PHOSLO) 667 MG capsule Take 3-4 capsules (2,001-2,668 mg total) by mouth See admin instructions. Take 2708 mg by mouth 3 times daily with meals, take 2001 mg by mouth with snacks (Patient taking differently: Take 2,001-2,668 mg by mouth See admin instructions. Take 2,668 mg by mouth three times a day with meals and 2,001 mg with optional snacks) 450 capsule 0  . cefTAZidime 2 g in sodium chloride 0.9 % 100 mL Inject 2 g into the vein every Monday, Wednesday, and Friday with hemodialysis.    Marland Kitchen clopidogrel (PLAVIX) 75 MG tablet Take 75 mg by mouth daily.    Marland Kitchen escitalopram (LEXAPRO) 5 MG tablet Take 1 tablet (5 mg total) by mouth daily. 30 tablet 0  . famotidine (PEPCID) 20 MG tablet Take 1 tablet (20 mg total) by mouth  daily. 30 tablet 0  . fluticasone (FLONASE) 50 MCG/ACT nasal spray Place 2 sprays into both nostrils as needed for allergies or rhinitis (congestion). (Patient taking differently: Place 2 sprays into both nostrils as needed for allergies or rhinitis (or congestion). ) 0.003 g 0  . furosemide (LASIX) 80 MG tablet Take 80 mg by mouth 2 (two) times daily as needed (fluid).    Marland Kitchen levothyroxine (SYNTHROID) 137 MCG tablet Take 137 mcg by mouth daily before breakfast.    . LORazepam (ATIVAN) 1 MG tablet Take 1 mg by mouth daily as needed (anxiety).     . Nutritional Supplements (NEPRO) LIQD Take 237 mLs by mouth daily. Mixed Berry    . oxyCODONE-acetaminophen (PERCOCET) 10-325 MG tablet Take 1 tablet by mouth every 6 (six) hours as needed for pain. 10 tablet 0  . promethazine (PHENERGAN) 25 MG tablet Take 1 tablet (25 mg total) by mouth 2 (two) times daily as needed for nausea or vomiting. (Patient taking differently: Take 25 mg by mouth every 8 (eight) hours as needed for nausea or vomiting. ) 30 tablet 0  . rosuvastatin (CRESTOR) 10 MG tablet Take 1 tablet (10 mg total) by mouth daily. 30 tablet 0  . sodium polystyrene (KAYEXALATE) powder Take 15 g by mouth See admin instructions. Take 15 g (mixed in water) by mouth on Saturdays  and Sundays 135 g 0  . traZODone (DESYREL) 100 MG tablet Take 1 tablet (100 mg total) by mouth at bedtime. 30 tablet 0  . vancomycin (VANCOCIN) 1-5 GM/200ML-% SOLN Inject 200 mLs (1,000 mg total) into the vein every Monday, Wednesday, and Friday with hemodialysis. 1200 mL 0  . zolpidem (AMBIEN) 10 MG tablet Take 1 tablet (10 mg total) by mouth at bedtime. 7 tablet 0   No current facility-administered medications for this visit.     ROS:  See HPI  BP 97/64 (BP Location: Right Arm, Patient Position: Sitting, Cuff Size: Large)   Pulse 67   Temp 98.3 F (36.8 C) (Temporal)   Resp 20   Ht 5\' 11"  (1.803 m)   Wt 259 lb 0.7 oz (117.5 kg)   SpO2 98%   BMI 36.13 kg/m   Physical  Exam:  General appearance:WD, WN in NAD Respiratory:nonlabored Extremities:  LLE wound is clean and granulating. Approximately 6 x 6 x <0.5 cm      Assessment/Plan:  This is a 54 y.o. male who is s/p:excision of left thigh AVG.  Will discontinue VAC and have instructed him on daily damp to dry NS dressing changes. Follow-up in one month.  Risa Grill, PA-C [facility] [phone]  Clinic MD:  Oneida Alar

## 2019-10-27 ENCOUNTER — Encounter: Payer: Medicare Other | Admitting: Surgery

## 2019-11-01 ENCOUNTER — Telehealth: Payer: Self-pay | Admitting: Physician Assistant

## 2019-11-01 NOTE — Telephone Encounter (Addendum)
   Reviewing charts for clinic next week in preparation for meeting this patient. Hospital notes reviewed. Per Dr. Camillia Herter note on 10/17/19, "I will plan to see him back in my office in 1-2 months. I will continue to follow his aortic stenosis for now, allowing the left thigh wound to heal." This request was sent to scheduling but appears he was scheduled for a 2 week virtual visit instead. Given complex medical history including severe aortic stenosis pending Dr. Camillia Herter decision about surgical management, would not suspect virtual visit is best avenue for this reassessment. Will route to MA to definitely still offer to leave as a check-in visit if needed but otherwise will still need to carry out the previous recommendation by Dr. Angelena Form to have him see MD back in the office in 1-2 months (mid [facility] December) for surgical discussions.  Richard Ritchey PA-C

## 2019-11-01 NOTE — Progress Notes (Deleted)
{Choose 1 Note Type (Video or Telephone):[phone]}   The patient was identified using 2 identifiers.  Date:  11/01/2019   ID:  [name], DOB [birth year], MRN [id number]  {Patient [phone]::"Home"} {Provider Location:[phone]::"Home Office"}  PCP:  [name], MD  Cardiologist:  Lauree Chandler, MD *** Electrophysiologist:  None   Evaluation Performed:  Follow-Up Visit  Chief Complaint:  ***  History of Present Illness:    [name] is a 54 y.o. male with ***   Labs Independently Reviewed   Past Medical History:  Diagnosis Date  . Anemia   . Anxiety   . Constipation   . End stage renal failure on dialysis [facility])    [place]; MWF, on HD since 1997, exhausted upper ext access, and possibly LE access; cath dependent in R groin as of 06/2018  . GERD (gastroesophageal reflux disease)   . Grand mal seizure ([place]) X 4   last 1998 (02/29/2016)  . History of blood transfusion 2000s   "I was having blood loss; never found out from where"  . History of kidney stones   . Hypertension    hx of - has not taken bp meds in over 2 years  since dialysis  . Hypothyroidism   . Insomnia   . Migraine    "due to my BP in my late 1990s; none since" (02/29/2016)  . Nonhealing surgical wound    of the left arteriovenous graft  . Severe aortic stenosis    no problems with per pt. - nephrologist and Dr. Coletta Memos  . Spina bifida [facility])    Past Surgical History:  Procedure Laterality Date  . AMPUTATION Left 12/11/2017   Procedure: Left Great Toe Amputation;  Surgeon: [name], MD;  Location: [place];  Service: Orthopedics;  Laterality: Left;  . APPENDECTOMY    . APPLICATION OF WOUND VAC Left 10/04/2019   Procedure: APPLICATION OF WOUND VAC;  Surgeon: Serafina Mitchell, MD;  Location: [place];  Service: Vascular;  Laterality: Left;  . ARTERIOVENOUS GRAFT PLACEMENT Left 10/16/2012   left femoral goretex graft         Dr Donnetta Hutching  . AV FISTULA PLACEMENT Left  06/27/2012   Procedure: EXPLORATORY LEFT THY-GRAFT PSEUDO-ANEURYSM;  Surgeon: Conrad Clemmons, MD;  Location: [place];  Service: Vascular;  Laterality: Left;  Revision of left Arteriovenus gortex graft in thigh.  Bertram Savin REMOVAL Left 10/04/2019   Procedure: REMOVAL OF ARTERIOVENOUS GORETEX GRAFT ([place]);  Surgeon: Serafina Mitchell, MD;  Location: [facility];  Service: Vascular;  Laterality: Left;  . COLONOSCOPY    . DRESSING CHANGE UNDER ANESTHESIA Left 10/07/2019   Procedure: DRESSING CHANGE UNDER ANESTHESIA;  Surgeon: Meredith Pel, MD;  Location: [place];  Service: Orthopedics;  Laterality: Left;  . FLEXIBLE SIGMOIDOSCOPY N/A 12/03/2018   Procedure: FLEXIBLE SIGMOIDOSCOPY;  Surgeon: Yetta Flock, MD;  Location: [place];  Service: Gastroenterology;  Laterality: N/A;  . I & D EXTREMITY Left 02/29/2016   Procedure: DEBRIDEMENT LEFT THIGH WOUND;  Surgeon: Waynetta Sandy, MD;  Location: [place];  Service: Vascular;  Laterality: Left;  . I & D EXTREMITY Left 10/04/2019   Procedure: IRRIGATION AND DEBRIDEMENT LEFT THIGH;  Surgeon: Serafina Mitchell, MD;  Location: [place];  Service: Vascular;  Laterality: Left;  . ILEOSTOMY  1970s  . INCISION AND DRAINAGE Left 10/16/2012   Procedure: INCISION AND Debridement left thigh graft;  Surgeon: Rosetta Posner, MD;  Location: [place];  Service: Vascular;  Laterality: Left;  .  INSERTION OF DIALYSIS CATHETER    . INSERTION OF DIALYSIS CATHETER  01/14/2016   Procedure: INSERTION OF DIALYSIS CATHETER;  Surgeon: Waynetta Sandy, MD;  Location: [place];  Service: Vascular;;  . INSERTION OF DIALYSIS CATHETER Right 08/07/2017   Procedure: INSERTION OF DIALYSIS CATHETER;  Surgeon: Waynetta Sandy, MD;  Location: [place];  Service: Vascular;  Laterality: Right;  . INSERTION OF DIALYSIS CATHETER Right 12/21/2017   Procedure: RIGHT FEMORAL DIALYSIS CATHETER EXCHANGE;  Surgeon: Marty Heck, MD;  Location: [place];  Service: Vascular;  Laterality:  Right;  . INSERTION OF ILIAC STENT Left 01/14/2016   Procedure: INSERTION OF ILIAC STENT;  Surgeon: Waynetta Sandy, MD;  Location: [place];  Service: Vascular;  Laterality: Left;  . INTRAOPERATIVE ARTERIOGRAM Left 01/14/2016   Procedure: INTRA OPERATIVE ARTERIOGRAM;  Surgeon: Waynetta Sandy, MD;  Location: [place];  Service: Vascular;  Laterality: Left;  . IR NEPHROSTOMY EXCHANGE LEFT  03/21/2019  . IR NEPHROSTOMY PLACEMENT LEFT  11/28/2018  . KNEE ARTHROSCOPY Left 10/07/2019   Procedure: ARTHROSCOPY KNEE w/ STIMULATING  BEAD PLACMENT;  Surgeon: Meredith Pel, MD;  Location: [place];  Service: Orthopedics;  Laterality: Left;  . LEFT HEART CATH AND CORONARY ANGIOGRAPHY N/A 10/17/2019   Procedure: LEFT HEART CATH AND CORONARY ANGIOGRAPHY;  Surgeon: Jettie Booze, MD;  Location: [place] CV LAB;  Service: Cardiovascular;  Laterality: N/A;  . LITHOTRIPSY  x 3   "& a laser treatment" (02/29/2016)  . NEPHROLITHOTOMY Left 03/26/2019   Procedure: NEPHROLITHOTOMY PERCUTANEOUS;  Surgeon: Alexis Frock, MD;  Location: [facility];  Service: Urology;  Laterality: Left;  2 HRS  . NEPHROSTOMY Bilateral 1968  . PATELLAR TENDON REPAIR Right 1990s   "big incision"  . PERIPHERAL VASCULAR CATHETERIZATION  01/14/2016   Procedure: A/V SHUNTOGRAM;  Surgeon: Waynetta Sandy, MD;  Location: [place];  Service: Vascular;;  . REVISION OF ARTERIOVENOUS GORETEX GRAFT Left 10/16/2012   Procedure: REVISION OF LEFT FEMORAL LOOP ARTERIOVENOUS GORETEX GRAFT;  Surgeon: Rosetta Posner, MD;  Location: [place];  Service: Vascular;  Laterality: Left;  . REVISION OF ARTERIOVENOUS GORETEX GRAFT Left 02/11/2015   Procedure: EXCISION OF SMALL SEGMENT OF EXPOSED LEFT THIGH NON FUNCTIONING  ARTERIOVENOUS GORETEX GRAFT;  Surgeon: Mal Misty, MD;  Location: [place];  Service: Vascular;  Laterality: Left;  . REVISION OF ARTERIOVENOUS GORETEX GRAFT Left 01/14/2016   Procedure: REVISION OF ARTERIOVENOUS GORETEX GRAFT;   Surgeon: Waynetta Sandy, MD;  Location: [place];  Service: Vascular;  Laterality: Left;  . REVISION OF ARTERIOVENOUS GORETEX GRAFT Left 02/29/2016   thigh/pt report  . REVISION OF ARTERIOVENOUS GORETEX GRAFT Left 02/29/2016   Procedure: POSSIBLE REVISION OF LEFT THIGH ARTERIOVENOUS GORETEX GRAFT;  Surgeon: Waynetta Sandy, MD;  Location: [place];  Service: Vascular;  Laterality: Left;  . TEE WITHOUT CARDIOVERSION N/A 12/14/2017   Procedure: TRANSESOPHAGEAL ECHOCARDIOGRAM (TEE);  Surgeon: Acie Fredrickson, Wonda Cheng, MD;  Location: [place];  Service: Cardiovascular;  Laterality: N/A;  . THROMBECTOMY AND REVISION OF ARTERIOVENTOUS (AV) GORETEX  GRAFT Left 06/12/2018   Procedure: Removal of infected left thigh graft;  Surgeon: Rosetta Posner, MD;  Location: [place];  Service: Vascular;  Laterality: Left;  . THROMBECTOMY W/ EMBOLECTOMY Left 01/14/2016   Procedure: THROMBECTOMY ARTERIOVENOUS GORE-TEX Left thigh GRAFT;  Surgeon: Waynetta Sandy, MD;  Location: [place];  Service: Vascular;  Laterality: Left;  . TONGUE SURGERY  ~ 1990   tongue-tie release   . ULTRASOUND GUIDANCE  FOR VASCULAR ACCESS Right 08/07/2017   Procedure: ULTRASOUND GUIDANCE FOR VASCULAR CANNULATION RIGHT FEMORAL VEIN AND LEFT AV FEMORAL GRAFT;  Surgeon: Waynetta Sandy, MD;  Location: [place];  Service: Vascular;  Laterality: Right;  . UPPER EXTREMITY VENOGRAPHY Bilateral 08/09/2017   Procedure: UPPER EXTREMITY VENOGRAPHY;  Surgeon: Serafina Mitchell, MD;  Location: [place] [facility];  Service: Cardiovascular;  Laterality: Bilateral;  . VENOGRAM N/A 09/20/2017   Procedure: RIGHT COMMON FEMORAL ARTERY EXPLORATION.  CANNULATION RIGHT COMMON FEMORAL VEIN. VENOGRAM CENTRAL ULTRA SOUND GUIDED RIGHT FEMORAL VEIN TIMES TWO.;  Surgeon: Waynetta Sandy, MD;  Location: [place];  Service: Vascular;  Laterality: N/A;  . VENOGRAM Right 10/04/2019   Procedure: VENOGRAM;  Surgeon: Serafina Mitchell, MD;  Location: [place];   Service: Vascular;  Laterality: Right;  . WOUND DEBRIDEMENT Left 02/29/2016   thigh     No outpatient medications have been marked as taking for the 11/04/19 encounter (Appointment) with Charlie Pitter, PA-C.     Allergies:   Hydrocodone, Colace [docusate], Furadantin [nitrofurantoin], Mandelamine [methenamine], Noroxin [norfloxacin], Other, Sulfa antibiotics, and Sulfur   Social History   Tobacco Use  . Smoking status: Never Smoker  . Smokeless tobacco: Never Used  Vaping Use  . Vaping Use: Never used  Substance Use Topics  . Alcohol use: Not Currently    Alcohol/week: 0.0 standard drinks    Comment: 02/29/2016 "nothing since  the mid 1990s  . Drug use: Not Currently    Types: Marijuana    Comment: 02/29/2016 "nothing since ~ 1995"     Family Hx: The patient's family history includes Bipolar disorder in his sister; Diabetes in his father, mother, and sister; Heart attack in his father; Heart disease in his mother; Hypertension in his mother.  ROS:   Please see the history of present illness.    *** All other systems reviewed and are negative.   Prior CV studies:   The following studies were reviewed today:      Labs/Other Tests and Data Reviewed:    EKG:  An ECG dated 10/15/19 was personally reviewed today and demonstrated:  NSR 78bpm, no acute changes  Recent Labs: 12/02/2018: Magnesium 2.1 03/21/2019: TSH 3.542 10/16/2019: ALT 7 10/18/2019: BUN 45; Creatinine, Ser 6.88; Hemoglobin 8.5; Platelets 153; Potassium 5.4; Sodium 133   Recent Lipid Panel Lab Results  Component Value Date/Time   CHOL 123 10/18/2019 03:01 AM   TRIG 69 10/18/2019 03:01 AM   HDL 41 10/18/2019 03:01 AM   CHOLHDL 3.0 10/18/2019 03:01 AM   LDLCALC 68 10/18/2019 03:01 AM    Wt Readings from Last 3 Encounters:  10/22/19 259 lb 0.7 oz (117.5 kg)  10/18/19 264 lb 8.8 oz (120 kg)  03/26/19 267 lb 10.2 oz (121.4 kg)     Objective:    Vital Signs:  There were no vitals taken for this  visit.   VS reviewed. General - *** in no acute distress Pulm - No labored breathing, no coughing during visit, no audible wheezing, speaking in full sentences Neuro - A+Ox3, no slurred speech, answers questions appropriately Psych - Pleasant affect  ASSESSMENT & PLAN:    1. ***   Time:   Today, I have spent *** minutes with the patient with telehealth technology discussing the above problems.     Medication Adjustments/Labs and Tests Ordered: Current medicines are reviewed at length with the patient today.  Testing and concerns regarding medicines are outlined above.    Follow Up:  {  F/U [phone]} {follow up:15908}  Signed, Charlie Pitter, PA-C  11/01/2019 2:14 PM    [facility]

## 2019-11-03 NOTE — Telephone Encounter (Signed)
Call placed to pt regarding phone message below. Pt is having some numbness to the right foot that started after the cath, once he got home. Pt was advised that he would need to be seen in person versus virtually. Pt advised that he didn't have a way to the office  I advised pt that I would send message back to Melina Copa, PA-C, to see what her recommendations were and I would call him back.  Please advise!

## 2019-11-03 NOTE — Telephone Encounter (Signed)
I have not met or evaluated patient before otherwise so will route to Dr. Angelena Form for advisement.  [name], this patient of yours was recently seen in the hospital with CP. Cor CT showed diffuse CAD but f/u cath showed nonobstructive CAD. He has severe AS. You had recommended "I will plan to see him back in my office in 1-2 months. I will continue to follow his aortic stenosis for now, allowing the left thigh wound to heal." He was inadvertantly scheduled for a 2 week virtual visit with me instead so we reached out to patient to reschedule. As below he is noting numbness of right foot that started after cath once he got home. Can you please advise? I am otherwise not familiar with this gentleman. Thanks. [name]

## 2019-11-04 ENCOUNTER — Other Ambulatory Visit: Payer: Self-pay

## 2019-11-04 ENCOUNTER — Telehealth: Payer: Medicare Other | Admitting: Physician Assistant

## 2019-11-04 NOTE — Telephone Encounter (Signed)
Cancel virtual appt today with Melina Copa, PA. Schedule with me later this week in the office. Gerald Stabs

## 2019-11-05 ENCOUNTER — Other Ambulatory Visit: Payer: Self-pay

## 2019-11-05 ENCOUNTER — Telehealth (INDEPENDENT_AMBULATORY_CARE_PROVIDER_SITE_OTHER): Payer: Medicare Other | Admitting: Internal Medicine

## 2019-11-05 ENCOUNTER — Encounter: Payer: Self-pay | Admitting: Cardiovascular Disease

## 2019-11-05 DIAGNOSIS — M009 Pyogenic arthritis, unspecified: Secondary | ICD-10-CM

## 2019-11-05 DIAGNOSIS — T8149XA Infection following a procedure, other surgical site, initial encounter: Secondary | ICD-10-CM

## 2019-11-05 DIAGNOSIS — T827XXD Infection and inflammatory reaction due to other cardiac and vascular devices, implants and grafts, subsequent encounter: Secondary | ICD-10-CM

## 2019-11-05 NOTE — Telephone Encounter (Signed)
Error

## 2019-11-05 NOTE — Progress Notes (Signed)
Virtual Visit via Telephone Note   This visit type was conducted due to national recommendations for restrictions regarding the COVID-19 Pandemic (e.g. social distancing) in an effort to limit this patient's exposure and mitigate transmission in our community.  Due to his co-morbid illnesses, this patient is at least at moderate risk for complications without adequate follow up.  This format is felt to be most appropriate for this patient at this time.  All issues noted in this document were discussed and addressed.  A limited physical exam was performed with this format.  Please refer to the patient's chart for his consent to telehealth for [facility]  Video Connection Lost Video connection was lost at > 50% of the duration of this visit, at which time the remainder of the visit was completed via audio only.     Date:  11/06/2019   ID:  [name], DOB 19-Sep-1965, MRN [id number]  Patient Location: Home Provider Location: Office/Clinic  PCP:  [name], MD  Cardiologist:  Lauree Chandler, MD  Electrophysiologist:  None   Evaluation Performed:  Follow-Up Visit  Chief Complaint:  Follow up- Aortic stenosis  History of Present Illness:    [name] is a 54 y.o. male with bacterial endocarditis, HTN, ESRD on HD, GERD, seizure disorder, spina bifida with myelomeningocele, kidney stones, PAD s/p left iliac artery stenting in 2018, CAD and severe aortic stenosis who is being seen today by video visit due to the Covid 19 pandemic. The patient did not wish to come into the office to be seen. I saw him in the office in October 2019 for the first time. He became ESRD in 1997. He has spinal bifida with myelomeningocele and kidney stones. He has no known cardiac disease. He had an echo in February 2019 but not in our system. It appears this was done in [facility]. He has an ileostomy. He has undergone stenting of the left iliac artery in 2018.  He is followed  closely by Dr. Donzetta Matters in [facility] with attempts at vascular access. He can walk but uses a wheelchair when he goes out of his house. Echo December 2019 with LVEF=60-65%. Moderate aortic stenosis with bicuspid morphology. (mean gradient 22 mmHg, AVA 2.2 cm2). He had osteomyelitis of his left great toe in December 2019 and underwent amputation of the toe 12/11/17. He has MSSA bacteremia at that time. TEE December 2019 to exclude vegetations with normal aortic cusp separation. He has had issues over the years with dialysis access. His left leg access is no longer functional and has an open wound. He was admitted to [facility] October 2021 after his right femoral dialysis catheter could not be accessed. He had an inferior venacavogram and is s/p I and D of the left thigh graft. Wound vac in place. He had left knee aspiration 10/06/19 and arthroscopic left knee irrigation and drainage with placement of antibiotic beads on 10/07/19. Blood cultures were negative although he had fevers. He was treated with IV antibiotics and followed by the ID team. His stay was complicated by anemia, diarrhea, hyperkalemia and hypotension on HD days. He had exertional chest pain during his hospital stay when working with PT. Cardiac CTA with three vessel CAD. Cardiac cath 10/17/19 with mild non-obstructive three vessel CAD. Echo 10/05/19 with LVEF=65-70%, mild LVH, normal RV function, moderate MAC, trivial MR. The aortic valve is functionally bicuspid with moderate calcification. Mean gradient 37 mmHg, peak gradient 60 mmHg, AVA 0.77cm2, dimensionless index 0.25.  The patient denies chest pain, dyspnea, palpitations, dizziness, near syncope or syncope. No lower extremity edema. Overall feeling well. Left thigh wound is still open. Wound vac removed due to clotting per pt. Energy level is ok. Numbness right foot resolved.   The patient does not have symptoms concerning for COVID-19 infection (fever, chills, cough, or new shortness of breath).     Past Medical History:  Diagnosis Date  . Anemia   . Anxiety   . Constipation   . End stage renal failure on dialysis [facility])    [place]; MWF, on HD since 1997, exhausted upper ext access, and possibly LE access; cath dependent in R groin as of 06/2018  . GERD (gastroesophageal reflux disease)   . Grand mal seizure ([place]) X 4   last 1998 (02/29/2016)  . History of blood transfusion 2000s   "I was having blood loss; never found out from where"  . History of kidney stones   . Hypertension    hx of - has not taken bp meds in over 2 years  since dialysis  . Hypothyroidism   . Insomnia   . Migraine    "due to my BP in my late 1990s; none since" (02/29/2016)  . Nonhealing surgical wound    of the left arteriovenous graft  . Severe aortic stenosis    no problems with per pt. - nephrologist and Dr. Coletta Memos  . Spina bifida [facility])    Past Surgical History:  Procedure Laterality Date  . AMPUTATION Left 12/11/2017   Procedure: Left Great Toe Amputation;  Surgeon: [name], MD;  Location: [place];  Service: Orthopedics;  Laterality: Left;  . APPENDECTOMY    . APPLICATION OF WOUND VAC Left 10/04/2019   Procedure: APPLICATION OF WOUND VAC;  Surgeon: Serafina Mitchell, MD;  Location: [place];  Service: Vascular;  Laterality: Left;  . ARTERIOVENOUS GRAFT PLACEMENT Left 10/16/2012   left femoral goretex graft         Dr Donnetta Hutching  . AV FISTULA PLACEMENT Left 06/27/2012   Procedure: EXPLORATORY LEFT THY-GRAFT PSEUDO-ANEURYSM;  Surgeon: Conrad [place], MD;  Location: [place];  Service: Vascular;  Laterality: Left;  Revision of left Arteriovenus gortex graft in thigh.  Bertram Savin REMOVAL Left 10/04/2019   Procedure: REMOVAL OF ARTERIOVENOUS GORETEX GRAFT ([place]);  Surgeon: Serafina Mitchell, MD;  Location: [facility];  Service: Vascular;  Laterality: Left;  . COLONOSCOPY    . DRESSING CHANGE UNDER ANESTHESIA Left 10/07/2019   Procedure: DRESSING CHANGE UNDER ANESTHESIA;  Surgeon: Meredith Pel, MD;  Location: [place];  Service: Orthopedics;  Laterality: Left;  . FLEXIBLE SIGMOIDOSCOPY N/A 12/03/2018   Procedure: FLEXIBLE SIGMOIDOSCOPY;  Surgeon: Yetta Flock, MD;  Location: [place];  Service: Gastroenterology;  Laterality: N/A;  . I & D EXTREMITY Left 02/29/2016   Procedure: DEBRIDEMENT LEFT THIGH WOUND;  Surgeon: Waynetta Sandy, MD;  Location: [place];  Service: Vascular;  Laterality: Left;  . I & D EXTREMITY Left 10/04/2019   Procedure: IRRIGATION AND DEBRIDEMENT LEFT THIGH;  Surgeon: Serafina Mitchell, MD;  Location: [place];  Service: Vascular;  Laterality: Left;  . ILEOSTOMY  1970s  . INCISION AND DRAINAGE Left 10/16/2012   Procedure: INCISION AND Debridement left thigh graft;  Surgeon: Rosetta Posner, MD;  Location: [place];  Service: Vascular;  Laterality: Left;  . INSERTION OF DIALYSIS CATHETER    . INSERTION OF DIALYSIS CATHETER  01/14/2016   Procedure: INSERTION OF DIALYSIS CATHETER;  Surgeon: Waynetta Sandy, MD;  Location: [place];  Service: Vascular;;  . INSERTION OF DIALYSIS CATHETER Right 08/07/2017   Procedure: INSERTION OF DIALYSIS CATHETER;  Surgeon: Waynetta Sandy, MD;  Location: [place];  Service: Vascular;  Laterality: Right;  . INSERTION OF DIALYSIS CATHETER Right 12/21/2017   Procedure: RIGHT FEMORAL DIALYSIS CATHETER EXCHANGE;  Surgeon: Marty Heck, MD;  Location: [place];  Service: Vascular;  Laterality: Right;  . INSERTION OF ILIAC STENT Left 01/14/2016   Procedure: INSERTION OF ILIAC STENT;  Surgeon: Waynetta Sandy, MD;  Location: [place];  Service: Vascular;  Laterality: Left;  . INTRAOPERATIVE ARTERIOGRAM Left 01/14/2016   Procedure: INTRA OPERATIVE ARTERIOGRAM;  Surgeon: Waynetta Sandy, MD;  Location: [place];  Service: Vascular;  Laterality: Left;  . IR NEPHROSTOMY EXCHANGE LEFT  03/21/2019  . IR NEPHROSTOMY PLACEMENT LEFT  11/28/2018  . KNEE ARTHROSCOPY Left 10/07/2019   Procedure: ARTHROSCOPY KNEE w/ STIMULATING  BEAD PLACMENT;   Surgeon: Meredith Pel, MD;  Location: [place];  Service: Orthopedics;  Laterality: Left;  . LEFT HEART CATH AND CORONARY ANGIOGRAPHY N/A 10/17/2019   Procedure: LEFT HEART CATH AND CORONARY ANGIOGRAPHY;  Surgeon: Jettie Booze, MD;  Location: [place] CV LAB;  Service: Cardiovascular;  Laterality: N/A;  . LITHOTRIPSY  x 3   "& a laser treatment" (02/29/2016)  . NEPHROLITHOTOMY Left 03/26/2019   Procedure: NEPHROLITHOTOMY PERCUTANEOUS;  Surgeon: Alexis Frock, MD;  Location: [facility];  Service: Urology;  Laterality: Left;  2 HRS  . NEPHROSTOMY Bilateral 1968  . PATELLAR TENDON REPAIR Right 1990s   "big incision"  . PERIPHERAL VASCULAR CATHETERIZATION  01/14/2016   Procedure: A/V SHUNTOGRAM;  Surgeon: Waynetta Sandy, MD;  Location: [place];  Service: Vascular;;  . REVISION OF ARTERIOVENOUS GORETEX GRAFT Left 10/16/2012   Procedure: REVISION OF LEFT FEMORAL LOOP ARTERIOVENOUS GORETEX GRAFT;  Surgeon: Rosetta Posner, MD;  Location: [place];  Service: Vascular;  Laterality: Left;  . REVISION OF ARTERIOVENOUS GORETEX GRAFT Left 02/11/2015   Procedure: EXCISION OF SMALL SEGMENT OF EXPOSED LEFT THIGH NON FUNCTIONING  ARTERIOVENOUS GORETEX GRAFT;  Surgeon: Mal Misty, MD;  Location: [place];  Service: Vascular;  Laterality: Left;  . REVISION OF ARTERIOVENOUS GORETEX GRAFT Left 01/14/2016   Procedure: REVISION OF ARTERIOVENOUS GORETEX GRAFT;  Surgeon: Waynetta Sandy, MD;  Location: [place];  Service: Vascular;  Laterality: Left;  . REVISION OF ARTERIOVENOUS GORETEX GRAFT Left 02/29/2016   thigh/pt report  . REVISION OF ARTERIOVENOUS GORETEX GRAFT Left 02/29/2016   Procedure: POSSIBLE REVISION OF LEFT THIGH ARTERIOVENOUS GORETEX GRAFT;  Surgeon: Waynetta Sandy, MD;  Location: [place];  Service: Vascular;  Laterality: Left;  . TEE WITHOUT CARDIOVERSION N/A 12/14/2017   Procedure: TRANSESOPHAGEAL ECHOCARDIOGRAM (TEE);  Surgeon: Acie Fredrickson, Wonda Cheng, MD;  Location: [place];   Service: Cardiovascular;  Laterality: N/A;  . THROMBECTOMY AND REVISION OF ARTERIOVENTOUS (AV) GORETEX  GRAFT Left 06/12/2018   Procedure: Removal of infected left thigh graft;  Surgeon: Rosetta Posner, MD;  Location: [place];  Service: Vascular;  Laterality: Left;  . THROMBECTOMY W/ EMBOLECTOMY Left 01/14/2016   Procedure: THROMBECTOMY ARTERIOVENOUS GORE-TEX Left thigh GRAFT;  Surgeon: Waynetta Sandy, MD;  Location: [place];  Service: Vascular;  Laterality: Left;  . TONGUE SURGERY  ~ 1990   tongue-tie release   . ULTRASOUND GUIDANCE FOR VASCULAR ACCESS Right 08/07/2017   Procedure: ULTRASOUND GUIDANCE FOR VASCULAR CANNULATION RIGHT FEMORAL VEIN AND LEFT AV FEMORAL GRAFT;  Surgeon: Waynetta Sandy, MD;  Location: [place];  Service: Vascular;  Laterality: Right;  . UPPER EXTREMITY VENOGRAPHY Bilateral 08/09/2017   Procedure: UPPER EXTREMITY VENOGRAPHY;  Surgeon: Serafina Mitchell, MD;  Location: [place] CV LAB;  Service: Cardiovascular;  Laterality: Bilateral;  . VENOGRAM N/A 09/20/2017   Procedure: RIGHT COMMON FEMORAL ARTERY EXPLORATION.  CANNULATION RIGHT COMMON FEMORAL VEIN. VENOGRAM CENTRAL ULTRA SOUND GUIDED RIGHT FEMORAL VEIN TIMES TWO.;  Surgeon: Waynetta Sandy, MD;  Location: [place];  Service: Vascular;  Laterality: N/A;  . VENOGRAM Right 10/04/2019   Procedure: VENOGRAM;  Surgeon: Serafina Mitchell, MD;  Location: [place];  Service: Vascular;  Laterality: Right;  . WOUND DEBRIDEMENT Left 02/29/2016   thigh     No outpatient medications have been marked as taking for the 11/06/19 encounter (Telemedicine) with Burnell Blanks, MD.     Allergies:   Hydrocodone, Colace [docusate], Furadantin [nitrofurantoin], Mandelamine [methenamine], Noroxin [norfloxacin], Other, Sulfa antibiotics, and Sulfur   Social History   Tobacco Use  . Smoking status: Never Smoker  . Smokeless tobacco: Never Used  Vaping Use  . Vaping Use: Never used  Substance Use Topics  . Alcohol  use: Not Currently    Alcohol/week: 0.0 standard drinks    Comment: 02/29/2016 "nothing since  the mid 1990s  . Drug use: Not Currently    Types: Marijuana    Comment: 02/29/2016 "nothing since ~ 1995"     Family Hx: The patient's family history includes Bipolar disorder in his sister; Diabetes in his father, mother, and sister; Heart attack in his father; Heart disease in his mother; Hypertension in his mother.  ROS:   Please see the history of present illness.    All other systems reviewed and are negative.   Prior CV studies:   The following studies were reviewed today:  Echo 10/05/19:  1. Left ventricular ejection fraction, by estimation, is 65 to 70%. The  left ventricle has normal function. The left ventricle has no regional  wall motion abnormalities. There is mild left ventricular hypertrophy.  Left ventricular diastolic parameters  are consistent with Grade II diastolic dysfunction (pseudonormalization).  2. Right ventricular systolic function is normal. The right ventricular  size is normal. Tricuspid regurgitation signal is inadequate for assessing  PA pressure.  3. Left atrial size was moderately dilated.  4. The mitral valve is abnormal. Trivial mitral valve regurgitation.  Moderate mitral annular calcification.  5. The aortic valve is functionally bicuspid. There is moderate  calcification of the aortic valve and evidence of severe stenosis. Aortic  valve mean gradient measures 37.2 mmHg. Aortic valve Vmax measures 3.88  m/s. Dimentionless index 0.25  6. The inferior vena cava is normal in size with greater than 50%  respiratory variability, suggesting right atrial pressure of 3 mmHg.   FINDINGS  Left Ventricle: Left ventricular ejection fraction, by estimation, is 65  to 70%. The left ventricle has normal function. The left ventricle has no  regional wall motion abnormalities. The left ventricular internal cavity  size was normal in size. There is  mild  left ventricular hypertrophy. Left ventricular diastolic parameters  are consistent with Grade II diastolic dysfunction (pseudonormalization).   Right Ventricle: The right ventricular size is normal. No increase in  right ventricular wall thickness. Right ventricular systolic function is  normal. Tricuspid regurgitation signal is inadequate for assessing PA  pressure.   Left Atrium: Left atrial size was moderately dilated.   Right Atrium: Right atrial size was  normal in size.   Pericardium: There is no evidence of pericardial effusion.   Mitral Valve: The mitral valve is abnormal. There is mild calcification of  the mitral valve leaflet(s). Moderate mitral annular calcification.  Trivial mitral valve regurgitation.   Tricuspid Valve: The tricuspid valve is grossly normal. Tricuspid valve  regurgitation is trivial.   Aortic Valve: The aortic valve is bicuspid. There is moderate  calcification of the aortic valve. There is mild to moderate aortic valve  annular calcification. Aortic valve regurgitation is not visualized.  Severe aortic stenosis is present. Aortic valve  mean gradient measures 37.2 mmHg. Aortic valve peak gradient measures 60.1  mmHg. Aortic valve area, by VTI measures 0.77 cm.   Pulmonic Valve: The pulmonic valve was grossly normal. Pulmonic valve  regurgitation is trivial.   Aorta: The aortic root is normal in size and structure.   Venous: The inferior vena cava is normal in size with greater than 50%  respiratory variability, suggesting right atrial pressure of 3 mmHg.   IAS/Shunts: No atrial level shunt detected by color flow Doppler.     LEFT VENTRICLE  PLAX 2D  LVIDd:     5.00 cm   Diastology  LVIDs:     3.60 cm   LV e' medial:  4.47 cm/s  LV PW:     1.20 cm   LV E/e' medial: 30.9  LV IVS:    1.20 cm   LV e' lateral:  6.20 cm/s  LVOT diam:   2.00 cm   LV E/e' lateral: 22.3  LV SV:     65  LV SV Index:  28  LVOT  Area:   3.14 cm    LV Volumes (MOD)  LV vol d, MOD A4C: 76.5 ml  LV vol s, MOD A4C: 34.2 ml  LV SV MOD A4C:   76.5 ml   RIGHT VENTRICLE  RV Basal diam: 3.30 cm   LEFT ATRIUM       Index    RIGHT ATRIUM      Index  LA diam:    5.20 cm 2.22 cm/m RA Area:   15.50 cm  LA Vol (A2C):  117.0 ml 49.93 ml/m RA Volume:  35.70 ml 15.23 ml/m  LA Vol (A4C):  109.0 ml 46.51 ml/m  LA Biplane Vol: 118.0 ml 50.36 ml/m  AORTIC VALVE  AV Area (Vmax):  0.94 cm  AV Area (Vmean):  0.89 cm  AV Area (VTI):   0.77 cm  AV Vmax:      387.75 cm/s  AV Vmean:     290.500 cm/s  AV VTI:      0.844 m  AV Peak Grad:   60.1 mmHg  AV Mean Grad:   37.2 mmHg  LVOT Vmax:     116.00 cm/s  LVOT Vmean:    82.100 cm/s  LVOT VTI:     0.207 m  LVOT/AV VTI ratio: 0.25    AORTA  Ao Root diam: 3.50 cm  Ao Asc diam: 3.40 cm   MITRAL VALVE  MV Area (PHT): 2.17 cm   SHUNTS  MV Decel Time: 349 msec   Systemic VTI: 0.21 m  MV E velocity: 138.00 cm/s Systemic Diam: 2.00 cm  MV A velocity: 104.00 cm/s  MV E/A ratio: 1.33   Cardiac cath 10/17/19:  Mid LAD lesion is 25% stenosed.  1st RPL lesion is 25% stenosed.  Prox Cx to Mid Cx lesion is 25% stenosed.  LV end diastolic pressure is normal.  There is severe aortic valve stenosis. Mean gradient 34 mm Hg.  Unable to access left femoral vein.  Diagnostic Dominance: Right Left Anterior Descending  Mid LAD lesion is 25% stenosed.  Left Circumflex  Prox Cx to Mid Cx lesion is 25% stenosed.  Right Coronary Artery  First Right Posterolateral Branch  1st RPL lesion is 25% stenosed.  Intervention  No interventions have been documented. Left Heart  Left Ventricle LV end diastolic pressure is normal.  Aortic Valve There is severe aortic valve stenosis. Mean gradient 34 mm Hg.  Coronary Diagrams  Diagnostic Dominance: Right   Labs/Other Tests and Data Reviewed:     EKG:  No ECG reviewed.  Recent Labs: 12/02/2018: Magnesium 2.1 03/21/2019: TSH 3.542 10/16/2019: ALT 7 10/18/2019: BUN 45; Creatinine, Ser 6.88; Hemoglobin 8.5; Platelets 153; Potassium 5.4; Sodium 133   Recent Lipid Panel Lab Results  Component Value Date/Time   CHOL 123 10/18/2019 03:01 AM   TRIG 69 10/18/2019 03:01 AM   HDL 41 10/18/2019 03:01 AM   CHOLHDL 3.0 10/18/2019 03:01 AM   LDLCALC 68 10/18/2019 03:01 AM    Wt Readings from Last 3 Encounters:  11/06/19 257 lb (116.6 kg)  10/22/19 259 lb 0.7 oz (117.5 kg)  10/18/19 264 lb 8.8 oz (120 kg)     Objective:    Vital Signs:  BP (!) 159/92   Pulse 67   Ht _0  (1.803 m)   Wt 257 lb (116.6 kg)   BMI 35.84 kg/m    VITAL SIGNS:  reviewed GEN:  no acute distress Pt on camera for first 5 minutes of call.   ASSESSMENT & PLAN:    1. Severe aortic stenosis: He has moderately severe to severe AS. Asymptomatic. He will be a poor candidate for TAVR given ongoing infection and open wounds as well as lack of access. He would likely be alternative access if we consider TAVR next year. He is asymptomatic at this time.   2. CAD without angina: No chest pain. Continue ASA, Plavix and statin.   3. ESRD on HD  COVID-19 Education: The signs and symptoms of COVID-19 were discussed with the patient and how to seek care for testing (follow up with PCP or arrange E-visit).  The importance of social distancing was discussed today.  Time:   Today, I have spent 20 minutes with the patient with telehealth technology discussing the above problems.     Medication Adjustments/Labs and Tests Ordered: Current medicines are reviewed at length with the patient today.  Concerns regarding medicines are outlined above.   Tests Ordered: No orders of the defined types were placed in this encounter.   Medication Changes: No orders of the defined types were placed in this encounter.   Disposition:  Follow up in 2  month(s)  Signed, Lauree Chandler, MD  11/06/2019 9:30 AM    [facility]

## 2019-11-05 NOTE — Patient Instructions (Signed)
Televisit; no avs

## 2019-11-05 NOTE — Progress Notes (Addendum)
I connected with  [name] on 11/05/19 by a video enabled telemedicine application and verified that I am speaking with the correct person using two identifiers.   I discussed the limitations of evaluation and management by telemedicine. The patient expressed understanding and agreed to proceed.    [facility] for Infectious Disease  Patient Active Problem List   Diagnosis Date Noted  . Chest pressure   . Coronary artery calcification   . Chest pain   . Sepsis without acute organ dysfunction ([place])   . Vascular graft infection ([place])   . Wound infection   . Pyogenic arthritis of left knee joint ([place])   . Pain and swelling of knee, left   . Complication associated with dialysis catheter   . Hyperkalemia   . Dysphagia   . Ureteral stone with hydronephrosis 03/26/2019  . Malfunction of nephrostomy tube ([place])   . Nephrostomy complication ([place]) 90/24/0973  . Abnormal CT scan, gastrointestinal tract   . Flank pain 11/26/2018  . Nephrolithiasis 11/26/2018  . Hydronephrosis with renal and ureteral calculus obstruction 11/26/2018  . Left flank pain 11/26/2018  . ESRD on dialysis ([place]) 06/12/2018  . PAD (peripheral artery disease) ([place]) 12/22/2017  . Problem with dialysis access ([place]) 12/21/2017  . Anemia 12/21/2017  . Hypothyroidism 12/21/2017  . GERD (gastroesophageal reflux disease) 12/21/2017  . Anxiety 12/21/2017  . Left great toe amputee ([place]) 12/19/2017  . Endocarditis of mitral valve   . Osteomyelitis of toe of left foot ([place])   . MSSA bacteremia 12/08/2017  . Fever   . Right flank pain   . ESRD (end stage renal disease) ([place]) 09/20/2017  . ESRD (end stage renal disease) on dialysis ([place]) 08/07/2017  . Nonhealing surgical wound 02/29/2016  . Clotted dialysis access ([place]) 01/14/2016  . Removal of staples 07/19/2012  . Complication of AV dialysis fistula 06/27/2012  . End stage renal disease ([place]) 06/27/2012    Patient's Medications  New  Prescriptions   No medications on file  Previous Medications   ASPIRIN 325 MG TABLET    Take 325 mg by mouth daily.   CALCIUM ACETATE (PHOSLO) 667 MG CAPSULE    Take 3-4 capsules (2,001-2,668 mg total) by mouth See admin instructions. Take 2708 mg by mouth 3 times daily with meals, take 2001 mg by mouth with snacks   CEFTAZIDIME 2 G IN SODIUM CHLORIDE 0.9 % 100 ML    Inject 2 g into the vein every Monday, Wednesday, and Friday with hemodialysis.   CLOPIDOGREL (PLAVIX) 75 MG TABLET    Take 75 mg by mouth daily.   ESCITALOPRAM (LEXAPRO) 5 MG TABLET    Take 1 tablet (5 mg total) by mouth daily.   FAMOTIDINE (PEPCID) 20 MG TABLET    Take 1 tablet (20 mg total) by mouth daily.   FLUTICASONE (FLONASE) 50 MCG/ACT NASAL SPRAY    Place 2 sprays into both nostrils as needed for allergies or rhinitis (congestion).   FUROSEMIDE (LASIX) 80 MG TABLET    Take 80 mg by mouth 2 (two) times daily as needed (fluid).   LEVOTHYROXINE (SYNTHROID) 137 MCG TABLET    Take 137 mcg by mouth daily before breakfast.   LORAZEPAM (ATIVAN) 1 MG TABLET    Take 1 mg by mouth daily as needed (anxiety).    NUTRITIONAL SUPPLEMENTS (NEPRO) LIQD    Take 237 mLs by mouth daily. Mixed Berry   OXYCODONE-ACETAMINOPHEN (PERCOCET) 10-325 MG TABLET    Take 1 tablet  by mouth every 6 (six) hours as needed for pain.   PROMETHAZINE (PHENERGAN) 25 MG TABLET    Take 1 tablet (25 mg total) by mouth 2 (two) times daily as needed for nausea or vomiting.   ROSUVASTATIN (CRESTOR) 10 MG TABLET    Take 1 tablet (10 mg total) by mouth daily.   SODIUM POLYSTYRENE (KAYEXALATE) POWDER    Take 15 g by mouth See admin instructions. Take 15 g (mixed in water) by mouth on Saturdays and Sundays   TRAZODONE (DESYREL) 100 MG TABLET    Take 1 tablet (100 mg total) by mouth at bedtime.   VANCOMYCIN (VANCOCIN) 1-5 GM/200ML-% SOLN    Inject 200 mLs (1,000 mg total) into the vein every Monday, Wednesday, and Friday with hemodialysis.   ZOLPIDEM (AMBIEN) 10 MG TABLET     Take 1 tablet (10 mg total) by mouth at bedtime.  Modified Medications   No medications on file  Discontinued Medications   No medications on file    Subjective: Patient is f/u hospital discharge for surgical site infection with left presumed septic knee  He reports the wound vac has been d'c'ed His groin wound is getting smaller. There is no pain, swelling, or discharge. The left knee suture is still in, there is some pain but much improved and ROM is intact  He denies f/c/nv//diarrhea  He finished iv vanc/ceftazidime with dialysis of 4 weeks on 11/01   [facility] number [phone]; fax [phone].  Review of Systems: ROS  Past Medical History:  Diagnosis Date  . Anemia   . Anxiety   . Constipation   . End stage renal failure on dialysis [facility])    [place]; MWF, on HD since 1997, exhausted upper ext access, and possibly LE access; cath dependent in R groin as of 06/2018  . GERD (gastroesophageal reflux disease)   . Grand mal seizure ([place]) X 4   last 1998 (02/29/2016)  . History of blood transfusion 2000s   "I was having blood loss; never found out from where"  . History of kidney stones   . Hypertension    hx of - has not taken bp meds in over 2 years  since dialysis  . Hypothyroidism   . Insomnia   . Migraine    "due to my BP in my late 1990s; none since" (02/29/2016)  . Nonhealing surgical wound    of the left arteriovenous graft  . Severe aortic stenosis    no problems with per pt. - nephrologist and Dr. Coletta Memos  . Spina bifida [facility])     Social History   Tobacco Use  . Smoking status: Never Smoker  . Smokeless tobacco: Never Used  Vaping Use  . Vaping Use: Never used  Substance Use Topics  . Alcohol use: Not Currently    Alcohol/week: 0.0 standard drinks    Comment: 02/29/2016 "nothing since  the mid 1990s  . Drug use: Not Currently    Types: Marijuana    Comment: 02/29/2016 "nothing since ~ 1995"    Family History  Problem  Relation Age of Onset  . Diabetes Mother   . Hypertension Mother   . Heart disease Mother        before age 38  . Diabetes Father   . Heart attack Father        X's 3  . Diabetes Sister   . Bipolar disorder Sister     Allergies  Allergen Reactions  . Hydrocodone Other (  See Comments)    Caused involuntary movement and twitching. CANNOT TAKE DUE TO MUSCLE SPASMS AND MUSCLE TREMORS  . Colace [Docusate] Diarrhea  . Furadantin [Nitrofurantoin] Other (See Comments)    UNSPECIFIED REACTION   . Mandelamine [Methenamine] Other (See Comments)    UNSPECIFIED REACTION   . Noroxin [Norfloxacin] Other (See Comments)    UNSPECIFIED REACTION   . Other Other (See Comments)  . Sulfa Antibiotics Other (See Comments) and Cough    Childhood reaction - pt could not confirm that it was a cough  . Sulfur Cough    Childhood reaction - pt could not confirm that it was a cough    Objective: There were no vitals filed for this visit. There is no height or weight on file to calculate BMI.  Physical Exam Patient is unable/not wanting to take off groin dressing (just had changed in the morning); no surrounding erythema seen Msk: left knee active rom intact No distress; conversant pulm normal respiratory effort    Assessment/plan:   Problem List Items Addressed This Visit    None     Abx: 10/7-c ceftazidime 10/2-c vanc  10/6 ceftriaxone 10/02-10/6 piptazo 2.25 q8hour   Assessment: Maddix Heinz a 54 y.o.malespina bifida, myelomeningocele, neurogenic bladder s/p ileal conduit, s/p ileostomy, httn/hlp/PAD, esrd, very difficult dialysis access situation,chart hx iliac artery stent,GERD,bicuspid valve with severe AS, OA, hx native MV endocarditis (MSSA), hx left renal abscess (fusobacterium),admitted for rle vascular graft site infection,course complicated byswollen right kneepresumed septic arthritis   Patient appears to have  had failed vascular graft left thigh that had been infected for at least several weeks prior to the 10/06 admission. He was given some abx in the dialysis clinic a few days prior to admission. Culture of the graft material/surrouding tissue only grow corynebacterium species. He subsequently had acute monoarthritis of the contiguous left knee (operative cx on abx negative; no crystal).   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 Also various gram positive/negative on gram stain of left thigh wound in the past   He is s/p 4 weeks appropriate abx by 11/01 and clinically doing well. I don't have picture and labs to review but physically/clinically appears to be much improved.  Of note, he had prior iliac stent per chart but off abx after his MV endocarditis seem to do fine, so will not place on suppressive abx course     Plan: -patient to send in pictures tomorrow and next week for me to review -precaution for fever/chill, local pain/swelling/redness/discharge then to contact our clinic again as needed   I spend 15 minutes with patient providing care, counseling Patient: Home  Provider: Office     Jabier Mutton, [place] for Infectious [place] -- -- pager   [phone] cell 11/05/2019, 2:06 PM

## 2019-11-06 ENCOUNTER — Other Ambulatory Visit: Payer: Self-pay

## 2019-11-06 ENCOUNTER — Telehealth (INDEPENDENT_AMBULATORY_CARE_PROVIDER_SITE_OTHER): Payer: Medicare Other | Admitting: Cardiovascular Disease

## 2019-11-06 ENCOUNTER — Encounter: Payer: Self-pay | Admitting: Cardiovascular Disease

## 2019-11-06 VITALS — BP 159/92 | HR 67 | Ht 71.0 in | Wt 257.0 lb

## 2019-11-06 DIAGNOSIS — I2510 Atherosclerotic heart disease of native coronary artery without angina pectoris: Secondary | ICD-10-CM | POA: Diagnosis not present

## 2019-11-06 DIAGNOSIS — I350 Nonrheumatic aortic (valve) stenosis: Secondary | ICD-10-CM | POA: Diagnosis not present

## 2019-11-06 NOTE — Patient Instructions (Signed)
Medication Instructions:  No changes *If you need a refill on your cardiac medications before your next appointment, please call your pharmacy*   Lab Work: none If you have labs (blood work) drawn today and your tests are completely normal, you will receive your results only by: Marland Kitchen MyChart Message (if you have MyChart) OR . A paper copy in the mail If you have any lab test that is abnormal or we need to change your treatment, we will call you to review the results.   Testing/Procedures: none   Follow-Up: As planned in January  Other Instructions

## 2019-11-18 ENCOUNTER — Ambulatory Visit (INDEPENDENT_AMBULATORY_CARE_PROVIDER_SITE_OTHER): Payer: Medicare Other | Admitting: Physician Assistant

## 2019-11-18 ENCOUNTER — Other Ambulatory Visit: Payer: Self-pay

## 2019-11-18 VITALS — BP 86/50 | HR 69 | Temp 98.6°F | Resp 20 | Ht 71.0 in | Wt 257.0 lb

## 2019-11-18 DIAGNOSIS — T148XXA Other injury of unspecified body region, initial encounter: Secondary | ICD-10-CM

## 2019-11-18 DIAGNOSIS — N186 End stage renal disease: Secondary | ICD-10-CM | POA: Diagnosis not present

## 2019-11-18 DIAGNOSIS — Z992 Dependence on renal dialysis: Secondary | ICD-10-CM

## 2019-11-18 NOTE — Progress Notes (Signed)
POST OPERATIVE OFFICE NOTE    CC:  F/u for surgery  HPI:  This is a 53 y.o. male who is s/p excision left thigh AVG, inferior venocavogram on October 04, 2019 by Dr. Trula Slade..  He is catheter dependent and has a right femoral TDC.    He has been performing normal saline wet-to-dry dressing changes daily to his left anterior thigh wound.  Denies fever or chills.  He is on Plavix daily. No reports of lower extremity pain. He arrives in wheelchair.  Allergies  Allergen Reactions  . Hydrocodone Other (See Comments)    Caused involuntary movement and twitching. CANNOT TAKE DUE TO MUSCLE SPASMS AND MUSCLE TREMORS  . Colace [Docusate] Diarrhea  . Furadantin [Nitrofurantoin] Other (See Comments)    UNSPECIFIED REACTION   . Mandelamine [Methenamine] Other (See Comments)    UNSPECIFIED REACTION   . Noroxin [Norfloxacin] Other (See Comments)    UNSPECIFIED REACTION   . Other Other (See Comments)  . Sulfa Antibiotics Other (See Comments) and Cough    Childhood reaction - pt could not confirm that it was a cough  . Sulfur Cough    Childhood reaction - pt could not confirm that it was a cough    Current Outpatient Medications  Medication Sig Dispense Refill  . aspirin 325 MG tablet Take 325 mg by mouth daily.    . calcium acetate (PHOSLO) 667 MG capsule Take 3-4 capsules (2,001-2,668 mg total) by mouth See admin instructions. Take 2708 mg by mouth 3 times daily with meals, take 2001 mg by mouth with snacks 450 capsule 0  . cefTAZidime 2 g in sodium chloride 0.9 % 100 mL Inject 2 g into the vein every Monday, Wednesday, and Friday with hemodialysis.    Marland Kitchen clopidogrel (PLAVIX) 75 MG tablet Take 75 mg by mouth daily.    Marland Kitchen escitalopram (LEXAPRO) 5 MG tablet Take 1 tablet (5 mg total) by mouth daily. 30 tablet 0  . famotidine (PEPCID) 20 MG tablet Take 1 tablet (20 mg total) by mouth daily. 30 tablet 0  . famotidine (PEPCID) 40 MG tablet Take 40 mg by mouth daily.    . fluticasone (FLONASE) 50  MCG/ACT nasal spray Place 2 sprays into both nostrils as needed for allergies or rhinitis (congestion). 0.003 g 0  . furosemide (LASIX) 80 MG tablet Take 80 mg by mouth 2 (two) times daily as needed (fluid).    Marland Kitchen levothyroxine (SYNTHROID) 125 MCG tablet Take 137 mcg by mouth daily.    Marland Kitchen levothyroxine (SYNTHROID) 137 MCG tablet Take 137 mcg by mouth daily before breakfast.    . LORazepam (ATIVAN) 1 MG tablet Take 1 mg by mouth daily as needed (anxiety).     . Nutritional Supplements (NEPRO) LIQD Take 237 mLs by mouth daily. Mixed Berry    . oxyCODONE-acetaminophen (PERCOCET) 10-325 MG tablet Take 1 tablet by mouth every 6 (six) hours as needed for pain. 10 tablet 0  . promethazine (PHENERGAN) 25 MG tablet Take 1 tablet (25 mg total) by mouth 2 (two) times daily as needed for nausea or vomiting. 30 tablet 0  . sodium polystyrene (KAYEXALATE) powder Take 15 g by mouth See admin instructions. Take 15 g (mixed in water) by mouth on Saturdays and Sundays 135 g 0  . traZODone (DESYREL) 100 MG tablet Take 1 tablet (100 mg total) by mouth at bedtime. 30 tablet 0  . zolpidem (AMBIEN) 10 MG tablet Take 1 tablet (10 mg total) by mouth at bedtime. 7 tablet  0  . rosuvastatin (CRESTOR) 10 MG tablet Take 1 tablet (10 mg total) by mouth daily. 30 tablet 0   No current facility-administered medications for this visit.     ROS:  See HPI  BP (!) 86/50 (BP Location: Right Arm, Patient Position: Sitting, Cuff Size: Large)   Pulse 69   Temp 98.6 F (37 C) (Temporal)   Resp 20   Ht 5\' 11"  (1.803 m)   Wt 257 lb (116.6 kg)   SpO2 98%   BMI 35.84 kg/m   Physical Exam:  General appearance: Well-developed well-nourished male in no apparent distress Respiratory: Nonlabored Extremities: Left thigh wound has beefy red granulation tissue.  Measures approximately 4.5 x 5.5 cm. Neuro: Alert and oriented     Assessment/Plan:  This is a 54 y.o. male who is s/p: Incision and drainage of left thigh with removal of  multiple old dialysis graft and overlying skin.  His wound continues to heal.  Continue dressing changes daily.  Advised to call our office for abnormal odor or color of wound drainage.  We will plan to see him in 6 to 8 weeks to check progression of healing.  Risa Grill, PA-C [facility] [phone]  Clinic MD:  Stanford Breed

## 2019-12-08 ENCOUNTER — Inpatient Hospital Stay (HOSPITAL_COMMUNITY)
Admission: EM | Admit: 2019-12-08 | Discharge: 2019-12-19 | DRG: 485 | Disposition: A | Discharge status: Home / Self Care | Payer: Medicare Other | Attending: Internal Medicine | Admitting: Internal Medicine

## 2019-12-08 DIAGNOSIS — E8889 Other specified metabolic disorders: Secondary | ICD-10-CM | POA: Diagnosis present

## 2019-12-08 DIAGNOSIS — Z882 Allergy status to sulfonamides status: Secondary | ICD-10-CM

## 2019-12-08 DIAGNOSIS — Z888 Allergy status to other drugs, medicaments and biological substances status: Secondary | ICD-10-CM

## 2019-12-08 DIAGNOSIS — M79661 Pain in right lower leg: Secondary | ICD-10-CM | POA: Diagnosis not present

## 2019-12-08 DIAGNOSIS — I120 Hypertensive chronic kidney disease with stage 5 chronic kidney disease or end stage renal disease: Secondary | ICD-10-CM | POA: Diagnosis present

## 2019-12-08 DIAGNOSIS — R200 Anesthesia of skin: Secondary | ICD-10-CM | POA: Diagnosis present

## 2019-12-08 DIAGNOSIS — Z992 Dependence on renal dialysis: Secondary | ICD-10-CM

## 2019-12-08 DIAGNOSIS — Z79899 Other long term (current) drug therapy: Secondary | ICD-10-CM

## 2019-12-08 DIAGNOSIS — K219 Gastro-esophageal reflux disease without esophagitis: Secondary | ICD-10-CM | POA: Diagnosis present

## 2019-12-08 DIAGNOSIS — Z818 Family history of other mental and behavioral disorders: Secondary | ICD-10-CM

## 2019-12-08 DIAGNOSIS — Z20822 Contact with and (suspected) exposure to covid-19: Secondary | ICD-10-CM | POA: Diagnosis present

## 2019-12-08 DIAGNOSIS — Z8249 Family history of ischemic heart disease and other diseases of the circulatory system: Secondary | ICD-10-CM

## 2019-12-08 DIAGNOSIS — S83289A Other tear of lateral meniscus, current injury, unspecified knee, initial encounter: Secondary | ICD-10-CM | POA: Diagnosis present

## 2019-12-08 DIAGNOSIS — Z833 Family history of diabetes mellitus: Secondary | ICD-10-CM

## 2019-12-08 DIAGNOSIS — G8929 Other chronic pain: Secondary | ICD-10-CM | POA: Diagnosis present

## 2019-12-08 DIAGNOSIS — Z87442 Personal history of urinary calculi: Secondary | ICD-10-CM

## 2019-12-08 DIAGNOSIS — N200 Calculus of kidney: Secondary | ICD-10-CM | POA: Diagnosis present

## 2019-12-08 DIAGNOSIS — R159 Full incontinence of feces: Secondary | ICD-10-CM | POA: Diagnosis present

## 2019-12-08 DIAGNOSIS — I959 Hypotension, unspecified: Secondary | ICD-10-CM | POA: Diagnosis not present

## 2019-12-08 DIAGNOSIS — Z936 Other artificial openings of urinary tract status: Secondary | ICD-10-CM

## 2019-12-08 DIAGNOSIS — E039 Hypothyroidism, unspecified: Secondary | ICD-10-CM | POA: Diagnosis present

## 2019-12-08 DIAGNOSIS — I25110 Atherosclerotic heart disease of native coronary artery with unstable angina pectoris: Secondary | ICD-10-CM | POA: Diagnosis present

## 2019-12-08 DIAGNOSIS — Q059 Spina bifida, unspecified: Secondary | ICD-10-CM

## 2019-12-08 DIAGNOSIS — M25061 Hemarthrosis, right knee: Secondary | ICD-10-CM | POA: Diagnosis present

## 2019-12-08 DIAGNOSIS — E875 Hyperkalemia: Secondary | ICD-10-CM | POA: Diagnosis not present

## 2019-12-08 DIAGNOSIS — Z79890 Hormone replacement therapy (postmenopausal): Secondary | ICD-10-CM

## 2019-12-08 DIAGNOSIS — D6959 Other secondary thrombocytopenia: Secondary | ICD-10-CM | POA: Diagnosis present

## 2019-12-08 DIAGNOSIS — M009 Pyogenic arthritis, unspecified: Principal | ICD-10-CM | POA: Diagnosis present

## 2019-12-08 DIAGNOSIS — M1710 Unilateral primary osteoarthritis, unspecified knee: Secondary | ICD-10-CM | POA: Diagnosis present

## 2019-12-08 DIAGNOSIS — D631 Anemia in chronic kidney disease: Secondary | ICD-10-CM | POA: Diagnosis present

## 2019-12-08 DIAGNOSIS — Z885 Allergy status to narcotic agent status: Secondary | ICD-10-CM

## 2019-12-08 DIAGNOSIS — M109 Gout, unspecified: Secondary | ICD-10-CM | POA: Diagnosis present

## 2019-12-08 DIAGNOSIS — G4700 Insomnia, unspecified: Secondary | ICD-10-CM | POA: Diagnosis present

## 2019-12-08 DIAGNOSIS — Z7902 Long term (current) use of antithrombotics/antiplatelets: Secondary | ICD-10-CM

## 2019-12-08 DIAGNOSIS — N2581 Secondary hyperparathyroidism of renal origin: Secondary | ICD-10-CM | POA: Diagnosis present

## 2019-12-08 DIAGNOSIS — N186 End stage renal disease: Secondary | ICD-10-CM

## 2019-12-08 DIAGNOSIS — E871 Hypo-osmolality and hyponatremia: Secondary | ICD-10-CM | POA: Diagnosis not present

## 2019-12-08 DIAGNOSIS — I350 Nonrheumatic aortic (valve) stenosis: Secondary | ICD-10-CM | POA: Diagnosis present

## 2019-12-08 DIAGNOSIS — F419 Anxiety disorder, unspecified: Secondary | ICD-10-CM | POA: Diagnosis present

## 2019-12-08 LAB — CBC
HCT: 33.8 % — ABNORMAL LOW (ref 39.0–52.0)
Hemoglobin: 10.7 g/dL — ABNORMAL LOW (ref 13.0–17.0)
MCH: 30.4 pg (ref 26.0–34.0)
MCHC: 31.7 g/dL (ref 30.0–36.0)
MCV: 96 fL (ref 80.0–100.0)
Platelets: 158 10*3/uL (ref 150–400)
RBC: 3.52 MIL/uL — ABNORMAL LOW (ref 4.22–5.81)
RDW: 16.4 % — ABNORMAL HIGH (ref 11.5–15.5)
WBC: 6.5 10*3/uL (ref 4.0–10.5)
nRBC: 0 % (ref 0.0–0.2)

## 2019-12-08 LAB — BASIC METABOLIC PANEL
Anion gap: 15 (ref 5–15)
BUN: 23 mg/dL — ABNORMAL HIGH (ref 6–20)
CO2: 26 mmol/L (ref 22–32)
Calcium: 9.2 mg/dL (ref 8.9–10.3)
Chloride: 94 mmol/L — ABNORMAL LOW (ref 98–111)
Creatinine, Ser: 5.21 mg/dL — ABNORMAL HIGH (ref 0.61–1.24)
GFR, Estimated: 12 mL/min — ABNORMAL LOW (ref >60)
Glucose, Bld: 104 mg/dL — ABNORMAL HIGH (ref 70–99)
Potassium: 4.4 mmol/L (ref 3.5–5.1)
Sodium: 135 mmol/L (ref 135–145)

## 2019-12-08 LAB — LACTIC ACID, PLASMA: Lactic Acid, Venous: 1.2 mmol/L (ref 0.5–1.9)

## 2019-12-08 LAB — TROPONIN I (HIGH SENSITIVITY)
Troponin I (High Sensitivity): 14 ng/L (ref <18)
Troponin I (High Sensitivity): 17 ng/L (ref <18)

## 2019-12-08 MED ORDER — ACETAMINOPHEN 325 MG PO TABS
650.0000 mg | ORAL_TABLET | Freq: Once | ORAL | Status: AC
  Administered 2019-12-08: 650 mg via ORAL
  Filled 2019-12-08: qty 2

## 2019-12-08 NOTE — ED Triage Notes (Signed)
Pt here from dialysis center with c/o right leg pain and chest pain after they started dialysis this morning almost full treatment today pt does have a fever in triage

## 2019-12-09 ENCOUNTER — Inpatient Hospital Stay (HOSPITAL_COMMUNITY): Payer: Medicare Other | Admitting: Anesthesiology

## 2019-12-09 ENCOUNTER — Other Ambulatory Visit: Payer: Self-pay

## 2019-12-09 ENCOUNTER — Encounter (HOSPITAL_COMMUNITY)
Admission: EM | Disposition: A | Discharge status: Home / Self Care | Payer: Self-pay | Source: Home / Self Care | Attending: Internal Medicine

## 2019-12-09 ENCOUNTER — Encounter (HOSPITAL_COMMUNITY): Payer: Self-pay | Admitting: Internal Medicine

## 2019-12-09 ENCOUNTER — Emergency Department (HOSPITAL_COMMUNITY): Payer: Medicare Other

## 2019-12-09 DIAGNOSIS — Q059 Spina bifida, unspecified: Secondary | ICD-10-CM | POA: Diagnosis not present

## 2019-12-09 DIAGNOSIS — M25461 Effusion, right knee: Secondary | ICD-10-CM | POA: Diagnosis not present

## 2019-12-09 DIAGNOSIS — Z833 Family history of diabetes mellitus: Secondary | ICD-10-CM | POA: Diagnosis not present

## 2019-12-09 DIAGNOSIS — Z7902 Long term (current) use of antithrombotics/antiplatelets: Secondary | ICD-10-CM | POA: Diagnosis not present

## 2019-12-09 DIAGNOSIS — K219 Gastro-esophageal reflux disease without esophagitis: Secondary | ICD-10-CM | POA: Diagnosis present

## 2019-12-09 DIAGNOSIS — M7989 Other specified soft tissue disorders: Secondary | ICD-10-CM | POA: Diagnosis not present

## 2019-12-09 DIAGNOSIS — L538 Other specified erythematous conditions: Secondary | ICD-10-CM | POA: Diagnosis not present

## 2019-12-09 DIAGNOSIS — Z992 Dependence on renal dialysis: Secondary | ICD-10-CM | POA: Diagnosis not present

## 2019-12-09 DIAGNOSIS — F419 Anxiety disorder, unspecified: Secondary | ICD-10-CM | POA: Diagnosis present

## 2019-12-09 DIAGNOSIS — I25110 Atherosclerotic heart disease of native coronary artery with unstable angina pectoris: Secondary | ICD-10-CM | POA: Diagnosis present

## 2019-12-09 DIAGNOSIS — N186 End stage renal disease: Secondary | ICD-10-CM | POA: Diagnosis present

## 2019-12-09 DIAGNOSIS — Z885 Allergy status to narcotic agent status: Secondary | ICD-10-CM | POA: Diagnosis not present

## 2019-12-09 DIAGNOSIS — Z888 Allergy status to other drugs, medicaments and biological substances status: Secondary | ICD-10-CM | POA: Diagnosis not present

## 2019-12-09 DIAGNOSIS — E871 Hypo-osmolality and hyponatremia: Secondary | ICD-10-CM | POA: Diagnosis not present

## 2019-12-09 DIAGNOSIS — Z882 Allergy status to sulfonamides status: Secondary | ICD-10-CM | POA: Diagnosis not present

## 2019-12-09 DIAGNOSIS — M009 Pyogenic arthritis, unspecified: Secondary | ICD-10-CM | POA: Diagnosis present

## 2019-12-09 DIAGNOSIS — N2581 Secondary hyperparathyroidism of renal origin: Secondary | ICD-10-CM | POA: Diagnosis present

## 2019-12-09 DIAGNOSIS — E039 Hypothyroidism, unspecified: Secondary | ICD-10-CM | POA: Diagnosis present

## 2019-12-09 DIAGNOSIS — M25061 Hemarthrosis, right knee: Secondary | ICD-10-CM | POA: Diagnosis present

## 2019-12-09 DIAGNOSIS — M79609 Pain in unspecified limb: Secondary | ICD-10-CM | POA: Diagnosis not present

## 2019-12-09 DIAGNOSIS — Z20822 Contact with and (suspected) exposure to covid-19: Secondary | ICD-10-CM | POA: Diagnosis present

## 2019-12-09 DIAGNOSIS — Z79890 Hormone replacement therapy (postmenopausal): Secondary | ICD-10-CM | POA: Diagnosis not present

## 2019-12-09 DIAGNOSIS — R011 Cardiac murmur, unspecified: Secondary | ICD-10-CM | POA: Diagnosis not present

## 2019-12-09 DIAGNOSIS — Z8249 Family history of ischemic heart disease and other diseases of the circulatory system: Secondary | ICD-10-CM | POA: Diagnosis not present

## 2019-12-09 DIAGNOSIS — Z87442 Personal history of urinary calculi: Secondary | ICD-10-CM | POA: Diagnosis not present

## 2019-12-09 DIAGNOSIS — I350 Nonrheumatic aortic (valve) stenosis: Secondary | ICD-10-CM | POA: Diagnosis present

## 2019-12-09 DIAGNOSIS — Z79899 Other long term (current) drug therapy: Secondary | ICD-10-CM | POA: Diagnosis not present

## 2019-12-09 DIAGNOSIS — M25561 Pain in right knee: Secondary | ICD-10-CM | POA: Diagnosis not present

## 2019-12-09 DIAGNOSIS — I120 Hypertensive chronic kidney disease with stage 5 chronic kidney disease or end stage renal disease: Secondary | ICD-10-CM | POA: Diagnosis present

## 2019-12-09 DIAGNOSIS — Z818 Family history of other mental and behavioral disorders: Secondary | ICD-10-CM | POA: Diagnosis not present

## 2019-12-09 HISTORY — PX: OTHER SURGICAL HISTORY: SHX169

## 2019-12-09 HISTORY — PX: I & D EXTREMITY: SHX5045

## 2019-12-09 LAB — SYNOVIAL CELL COUNT + DIFF, W/ CRYSTALS
Crystals, Fluid: NONE SEEN
Eosinophils-Synovial: 0 % (ref 0–1)
Lymphocytes-Synovial Fld: 0 % (ref 0–20)
Monocyte-Macrophage-Synovial Fluid: 5 % — ABNORMAL LOW (ref 50–90)
Neutrophil, Synovial: 95 % — ABNORMAL HIGH (ref 0–25)
WBC, Synovial: 8540 /mm3 — ABNORMAL HIGH (ref 0–200)

## 2019-12-09 LAB — SEDIMENTATION RATE: Sed Rate: 93 mm/hr — ABNORMAL HIGH (ref 0–16)

## 2019-12-09 LAB — RESP PANEL BY RT-PCR (FLU A&B, COVID) ARPGX2
Influenza A by PCR: NEGATIVE
Influenza B by PCR: NEGATIVE
SARS Coronavirus 2 by RT PCR: NEGATIVE

## 2019-12-09 LAB — SURGICAL PCR SCREEN
MRSA, PCR: NEGATIVE
Staphylococcus aureus: NEGATIVE

## 2019-12-09 LAB — HIV ANTIBODY (ROUTINE TESTING W REFLEX): HIV Screen 4th Generation wRfx: NONREACTIVE

## 2019-12-09 LAB — C-REACTIVE PROTEIN: CRP: 21.5 mg/dL — ABNORMAL HIGH (ref <1.0)

## 2019-12-09 SURGERY — IRRIGATION AND DEBRIDEMENT EXTREMITY
Anesthesia: General | Laterality: Right

## 2019-12-09 MED ORDER — CINACALCET HCL 30 MG PO TABS
30.0000 mg | ORAL_TABLET | ORAL | Status: DC
  Administered 2019-12-10 – 2019-12-19 (×4): 30 mg via ORAL
  Filled 2019-12-09 (×5): qty 1

## 2019-12-09 MED ORDER — ZOLPIDEM TARTRATE 5 MG PO TABS
10.0000 mg | ORAL_TABLET | Freq: Every day | ORAL | Status: DC
  Administered 2019-12-09 – 2019-12-18 (×10): 10 mg via ORAL
  Filled 2019-12-09 (×10): qty 2

## 2019-12-09 MED ORDER — LIDOCAINE 2% (20 MG/ML) 5 ML SYRINGE
INTRAMUSCULAR | Status: DC | PRN
  Administered 2019-12-09: 80 mg via INTRAVENOUS

## 2019-12-09 MED ORDER — CALCIUM ACETATE (PHOS BINDER) 667 MG PO CAPS
2668.0000 mg | ORAL_CAPSULE | Freq: Three times a day (TID) | ORAL | Status: DC
  Administered 2019-12-10 – 2019-12-12 (×7): 2668 mg via ORAL
  Filled 2019-12-09 (×11): qty 4

## 2019-12-09 MED ORDER — FENTANYL CITRATE (PF) 250 MCG/5ML IJ SOLN
INTRAMUSCULAR | Status: AC
  Filled 2019-12-09: qty 5

## 2019-12-09 MED ORDER — PROMETHAZINE HCL 25 MG PO TABS: 12.5000 mg | ORAL_TABLET | Freq: Four times a day (QID) | ORAL | Status: DC | PRN

## 2019-12-09 MED ORDER — CEFAZOLIN SODIUM-DEXTROSE 2-4 GM/100ML-% IV SOLN
INTRAVENOUS | Status: AC
  Filled 2019-12-09: qty 100

## 2019-12-09 MED ORDER — LEVOTHYROXINE SODIUM 25 MCG PO TABS
137.0000 ug | ORAL_TABLET | Freq: Every day | ORAL | Status: DC
  Administered 2019-12-10 – 2019-12-19 (×10): 137 ug via ORAL
  Filled 2019-12-09 (×10): qty 1

## 2019-12-09 MED ORDER — MIDAZOLAM HCL 2 MG/2ML IJ SOLN
INTRAMUSCULAR | Status: AC
  Filled 2019-12-09: qty 2

## 2019-12-09 MED ORDER — VANCOMYCIN VARIABLE DOSE PER UNSTABLE RENAL FUNCTION (PHARMACIST DOSING): Status: DC

## 2019-12-09 MED ORDER — CLOPIDOGREL BISULFATE 75 MG PO TABS
75.0000 mg | ORAL_TABLET | Freq: Every day | ORAL | Status: DC
  Administered 2019-12-10 – 2019-12-19 (×8): 75 mg via ORAL
  Filled 2019-12-09 (×9): qty 1

## 2019-12-09 MED ORDER — ROSUVASTATIN CALCIUM 5 MG PO TABS
10.0000 mg | ORAL_TABLET | Freq: Every day | ORAL | Status: DC
  Administered 2019-12-10 – 2019-12-19 (×9): 10 mg via ORAL
  Filled 2019-12-09 (×9): qty 2

## 2019-12-09 MED ORDER — VANCOMYCIN HCL IN DEXTROSE 1-5 GM/200ML-% IV SOLN
1000.0000 mg | Freq: Once | INTRAVENOUS | Status: AC
  Administered 2019-12-09: 1000 mg via INTRAVENOUS
  Filled 2019-12-09: qty 200

## 2019-12-09 MED ORDER — HYDROMORPHONE HCL 1 MG/ML IJ SOLN
1.0000 mg | Freq: Once | INTRAMUSCULAR | Status: AC
  Administered 2019-12-10: 1 mg via INTRAVENOUS

## 2019-12-09 MED ORDER — ONDANSETRON HCL 4 MG/2ML IJ SOLN: 4.0000 mg | Freq: Once | INTRAMUSCULAR | Status: AC | PRN

## 2019-12-09 MED ORDER — ONDANSETRON HCL 4 MG/2ML IJ SOLN
4.0000 mg | Freq: Once | INTRAMUSCULAR | Status: AC
  Administered 2019-12-09: 4 mg via INTRAVENOUS
  Filled 2019-12-09: qty 2

## 2019-12-09 MED ORDER — CEFAZOLIN SODIUM-DEXTROSE 2-4 GM/100ML-% IV SOLN
2.0000 g | INTRAVENOUS | Status: AC
  Administered 2019-12-09: 2 g via INTRAVENOUS

## 2019-12-09 MED ORDER — SODIUM CHLORIDE 0.9 % IR SOLN
Status: DC | PRN
  Administered 2019-12-09: 3000 mL

## 2019-12-09 MED ORDER — POVIDONE-IODINE 10 % EX SWAB: 2.0000 "application " | Freq: Once | CUTANEOUS | Status: DC

## 2019-12-09 MED ORDER — OXYCODONE HCL 5 MG PO TABS
5.0000 mg | ORAL_TABLET | Freq: Four times a day (QID) | ORAL | Status: DC | PRN
  Administered 2019-12-09 – 2019-12-11 (×3): 10 mg via ORAL
  Filled 2019-12-09 (×3): qty 2

## 2019-12-09 MED ORDER — HYDROMORPHONE HCL 1 MG/ML IJ SOLN
0.5000 mg | INTRAMUSCULAR | Status: DC | PRN
  Administered 2019-12-09: 1 mg via INTRAVENOUS
  Administered 2019-12-10 – 2019-12-11 (×4): 0.5 mg via INTRAVENOUS
  Filled 2019-12-09 (×5): qty 1

## 2019-12-09 MED ORDER — ASPIRIN 325 MG PO TABS
325.0000 mg | ORAL_TABLET | Freq: Every day | ORAL | Status: DC
  Administered 2019-12-10 – 2019-12-13 (×3): 325 mg via ORAL
  Filled 2019-12-09 (×3): qty 1

## 2019-12-09 MED ORDER — HYDROMORPHONE HCL 1 MG/ML IJ SOLN
1.0000 mg | Freq: Once | INTRAMUSCULAR | Status: AC
  Administered 2019-12-09: 1 mg via INTRAVENOUS
  Filled 2019-12-09: qty 1

## 2019-12-09 MED ORDER — DOXERCALCIFEROL 4 MCG/2ML IV SOLN
4.0000 ug | INTRAVENOUS | Status: DC
  Administered 2019-12-10: 4 ug via INTRAVENOUS

## 2019-12-09 MED ORDER — FENTANYL CITRATE (PF) 100 MCG/2ML IJ SOLN
25.0000 ug | INTRAMUSCULAR | Status: DC | PRN
  Administered 2019-12-09: 50 ug via INTRAVENOUS

## 2019-12-09 MED ORDER — PHENYLEPHRINE 40 MCG/ML (10ML) SYRINGE FOR IV PUSH (FOR BLOOD PRESSURE SUPPORT)
PREFILLED_SYRINGE | INTRAVENOUS | Status: DC | PRN
  Administered 2019-12-09 (×2): 80 ug via INTRAVENOUS
  Administered 2019-12-09: 120 ug via INTRAVENOUS

## 2019-12-09 MED ORDER — FAMOTIDINE 20 MG PO TABS
20.0000 mg | ORAL_TABLET | Freq: Every day | ORAL | Status: DC
  Administered 2019-12-10 – 2019-12-19 (×8): 20 mg via ORAL
  Filled 2019-12-09 (×8): qty 1

## 2019-12-09 MED ORDER — CHLORHEXIDINE GLUCONATE 0.12 % MT SOLN: 15.0000 mL | Freq: Once | OROMUCOSAL | Status: DC

## 2019-12-09 MED ORDER — ORAL CARE MOUTH RINSE: 15.0000 mL | Freq: Once | OROMUCOSAL | Status: DC

## 2019-12-09 MED ORDER — PROPOFOL 10 MG/ML IV BOLUS
INTRAVENOUS | Status: DC | PRN
  Administered 2019-12-09: 60 mg via INTRAVENOUS

## 2019-12-09 MED ORDER — PHENYLEPHRINE HCL-NACL 10-0.9 MG/250ML-% IV SOLN
INTRAVENOUS | Status: DC | PRN
  Administered 2019-12-09: 30 ug/min via INTRAVENOUS

## 2019-12-09 MED ORDER — ONDANSETRON HCL 4 MG/2ML IJ SOLN
INTRAMUSCULAR | Status: DC | PRN
  Administered 2019-12-09: 4 mg via INTRAVENOUS

## 2019-12-09 MED ORDER — LIDOCAINE HCL (PF) 1 % IJ SOLN
5.0000 mL | Freq: Once | INTRAMUSCULAR | Status: AC
  Administered 2019-12-09: 5 mL via INTRADERMAL
  Filled 2019-12-09: qty 5

## 2019-12-09 MED ORDER — FENTANYL CITRATE (PF) 250 MCG/5ML IJ SOLN
INTRAMUSCULAR | Status: DC | PRN
  Administered 2019-12-09 (×3): 50 ug via INTRAVENOUS

## 2019-12-09 MED ORDER — CHLORHEXIDINE GLUCONATE 0.12 % MT SOLN
OROMUCOSAL | Status: AC
  Filled 2019-12-09: qty 15

## 2019-12-09 MED ORDER — MIDAZOLAM HCL 2 MG/2ML IJ SOLN
INTRAMUSCULAR | Status: DC | PRN
  Administered 2019-12-09: .5 mg via INTRAVENOUS

## 2019-12-09 MED ORDER — 0.9 % SODIUM CHLORIDE (POUR BTL) OPTIME
TOPICAL | Status: DC | PRN
  Administered 2019-12-09: 1000 mL

## 2019-12-09 MED ORDER — SODIUM CHLORIDE 0.9 % IV SOLN: INTRAVENOUS | Status: DC

## 2019-12-09 MED ORDER — VANCOMYCIN HCL 1500 MG/300ML IV SOLN
1500.0000 mg | Freq: Once | INTRAVENOUS | Status: AC
  Administered 2019-12-09: 1500 mg via INTRAVENOUS
  Filled 2019-12-09: qty 300

## 2019-12-09 MED ORDER — LORAZEPAM 1 MG PO TABS
1.0000 mg | ORAL_TABLET | Freq: Every day | ORAL | Status: DC | PRN
  Administered 2019-12-15 – 2019-12-17 (×3): 1 mg via ORAL
  Filled 2019-12-09 (×3): qty 1

## 2019-12-09 MED ORDER — ONDANSETRON HCL 4 MG/2ML IJ SOLN
INTRAMUSCULAR | Status: AC
  Administered 2019-12-09: 4 mg via INTRAVENOUS
  Filled 2019-12-09: qty 2

## 2019-12-09 MED ORDER — NEPRO/CARBSTEADY PO LIQD
237.0000 mL | Freq: Every day | ORAL | Status: DC
  Administered 2019-12-10 – 2019-12-19 (×8): 237 mL via ORAL

## 2019-12-09 MED ORDER — ACETAMINOPHEN 325 MG PO TABS
650.0000 mg | ORAL_TABLET | Freq: Three times a day (TID) | ORAL | Status: DC
  Administered 2019-12-09 – 2019-12-19 (×30): 650 mg via ORAL
  Filled 2019-12-09 (×30): qty 2

## 2019-12-09 MED ORDER — ESCITALOPRAM OXALATE 10 MG PO TABS
5.0000 mg | ORAL_TABLET | Freq: Every day | ORAL | Status: DC
  Administered 2019-12-10 – 2019-12-19 (×8): 5 mg via ORAL
  Filled 2019-12-09 (×8): qty 1

## 2019-12-09 MED ORDER — FENTANYL CITRATE (PF) 100 MCG/2ML IJ SOLN
INTRAMUSCULAR | Status: AC
  Filled 2019-12-09: qty 2

## 2019-12-09 MED ORDER — CHLORHEXIDINE GLUCONATE 4 % EX LIQD: 60.0000 mL | Freq: Once | CUTANEOUS | Status: DC

## 2019-12-09 MED ORDER — DEXAMETHASONE SODIUM PHOSPHATE 10 MG/ML IJ SOLN
INTRAMUSCULAR | Status: DC | PRN
  Administered 2019-12-09: 4 mg via INTRAVENOUS

## 2019-12-09 MED ORDER — FLUTICASONE PROPIONATE 50 MCG/ACT NA SUSP
2.0000 | NASAL | Status: DC | PRN
  Administered 2019-12-12 – 2019-12-18 (×4): 2 via NASAL
  Filled 2019-12-09: qty 16

## 2019-12-09 MED ORDER — TRAZODONE HCL 50 MG PO TABS
100.0000 mg | ORAL_TABLET | Freq: Every day | ORAL | Status: DC
  Administered 2019-12-09 – 2019-12-18 (×10): 100 mg via ORAL
  Filled 2019-12-09 (×10): qty 2

## 2019-12-09 MED ORDER — PIPERACILLIN-TAZOBACTAM IN DEX 2-0.25 GM/50ML IV SOLN
2.2500 g | Freq: Three times a day (TID) | INTRAVENOUS | Status: DC
  Administered 2019-12-10 – 2019-12-11 (×4): 2.25 g via INTRAVENOUS
  Filled 2019-12-09 (×7): qty 50

## 2019-12-09 SURGICAL SUPPLY — 39 items
BNDG COHESIVE 4X5 TAN STRL (GAUZE/BANDAGES/DRESSINGS) ×2 IMPLANT
BRUSH SCRUB EZ PLAIN DRY (MISCELLANEOUS) ×2 IMPLANT
CANNULA 5.75X7 CRYSTAL CLEAR (CANNULA) ×2 IMPLANT
CANNULA 5.75X71 LONG (CANNULA) ×2 IMPLANT
COVER SURGICAL LIGHT HANDLE (MISCELLANEOUS) ×2 IMPLANT
DRAPE U-SHAPE 47X51 STRL (DRAPES) ×2 IMPLANT
DRSG MEPILEX BORDER 4X4 (GAUZE/BANDAGES/DRESSINGS) ×4 IMPLANT
ELECT REM PT RETURN 9FT ADLT (ELECTROSURGICAL)
ELECTRODE REM PT RTRN 9FT ADLT (ELECTROSURGICAL) IMPLANT
GAUZE SPONGE 4X4 12PLY STRL (GAUZE/BANDAGES/DRESSINGS) ×4 IMPLANT
GLOVE BIO SURGEON STRL SZ7.5 (GLOVE) ×2 IMPLANT
GLOVE BIO SURGEON STRL SZ8 (GLOVE) ×2 IMPLANT
GLOVE BIOGEL PI IND STRL 7.5 (GLOVE) ×1 IMPLANT
GLOVE BIOGEL PI IND STRL 8 (GLOVE) ×1 IMPLANT
GLOVE BIOGEL PI IND STRL 9 (GLOVE) ×1 IMPLANT
GLOVE BIOGEL PI INDICATOR 7.5 (GLOVE) ×1
GLOVE BIOGEL PI INDICATOR 8 (GLOVE) ×1
GLOVE BIOGEL PI INDICATOR 9 (GLOVE) ×1
GOWN STRL REUS W/ TWL LRG LVL3 (GOWN DISPOSABLE) ×2 IMPLANT
GOWN STRL REUS W/ TWL XL LVL3 (GOWN DISPOSABLE) ×1 IMPLANT
GOWN STRL REUS W/TWL LRG LVL3 (GOWN DISPOSABLE) ×2
GOWN STRL REUS W/TWL XL LVL3 (GOWN DISPOSABLE) ×1
KIT BASIN OR (CUSTOM PROCEDURE TRAY) ×2 IMPLANT
KIT TURNOVER KIT B (KITS) ×2 IMPLANT
MANIFOLD NEPTUNE II (INSTRUMENTS) ×2 IMPLANT
NS IRRIG 1000ML POUR BTL (IV SOLUTION) ×2 IMPLANT
PACK ORTHO EXTREMITY (CUSTOM PROCEDURE TRAY) ×2 IMPLANT
PAD ARMBOARD 7.5X6 YLW CONV (MISCELLANEOUS) ×4 IMPLANT
SET IRRIG Y TYPE TUR BLADDER L (SET/KITS/TRAYS/PACK) ×2 IMPLANT
SOL PREP POV-IOD 4OZ 10% (MISCELLANEOUS) ×2 IMPLANT
SOL PREP PROV IODINE SCRUB 4OZ (MISCELLANEOUS) ×2 IMPLANT
SPONGE LAP 18X18 RF (DISPOSABLE) ×4 IMPLANT
STAPLER VISISTAT 35W (STAPLE) ×2 IMPLANT
STOCKINETTE IMPERVIOUS LG (DRAPES) ×2 IMPLANT
SUT ETHILON 2 0 PSLX (SUTURE) ×2 IMPLANT
TOWEL GREEN STERILE (TOWEL DISPOSABLE) ×4 IMPLANT
TOWEL GREEN STERILE FF (TOWEL DISPOSABLE) ×2 IMPLANT
TUBE CONNECTING 12X1/4 (SUCTIONS) ×2 IMPLANT
UNDERPAD 30X36 HEAVY ABSORB (UNDERPADS AND DIAPERS) ×2 IMPLANT

## 2019-12-09 NOTE — Plan of Care (Signed)

## 2019-12-09 NOTE — Anesthesia Procedure Notes (Signed)
Procedure Name: LMA Insertion Date/Time: 12/09/2019 3:50 PM Performed by: Audry Pili, MD Pre-anesthesia Checklist: Patient identified, Emergency Drugs available, Suction available and Patient being monitored Patient Re-evaluated:Patient Re-evaluated prior to induction Oxygen Delivery Method: Circle system utilized Preoxygenation: Pre-oxygenation with 100% oxygen Induction Type: IV induction and Combination inhalational/ intravenous induction Ventilation: Mask ventilation without difficulty LMA: LMA inserted LMA Size: 5.0 Number of attempts: 1 Placement Confirmation: positive ETCO2,  breath sounds checked- equal and bilateral and CO2 detector Tube secured with: Tape Dental Injury: Teeth and Oropharynx as per pre-operative assessment

## 2019-12-09 NOTE — Anesthesia Preprocedure Evaluation (Addendum)
Anesthesia Evaluation  Patient identified by MRN, date of birth, ID band Patient awake    Reviewed: Allergy & Precautions, NPO status , Patient's Chart, lab work & pertinent test results  History of Anesthesia Complications Negative for: history of anesthetic complications  Airway Mallampati: III  TM Distance: >3 FB Neck ROM: Full    Dental  (+) Dental Advisory Given, Poor Dentition   Pulmonary neg pulmonary ROS,    Pulmonary exam normal        Cardiovascular hypertension, (-) angina+ CAD and + Peripheral Vascular Disease  + Valvular Problems/Murmurs AS  Rhythm:Regular Rate:Normal + Systolic murmurs  '21 Cath - Mid LAD lesion is 25% stenosed. 1st RPL lesion is 25% stenosed. Prox Cx to Mid Cx lesion is 25% stenosed. LV end diastolic pressure is normal. There is severe aortic valve stenosis. Mean gradient 34 mm Hg. Unable to access left femoral vein.  '21 TTE - EF 65 to 70%. Mild LVH. Grade II diastolic dysfunction (pseudonormalization). LA moderately dilated. Trivial MR. AV is functionally bicuspid. Severe AS, aortic valve mean gradient measures 37.2 mmHg. Aortic valve Vmax measures 3.88 m/s. Dimentionless index 0.25     Neuro/Psych  Headaches, Seizures -, Well Controlled,  PSYCHIATRIC DISORDERS Anxiety  Spina Bifida     GI/Hepatic Neg liver ROS, GERD  Medicated and Controlled,  Endo/Other  Hypothyroidism  Obesity   Renal/GU ESRF and DialysisRenal disease     Musculoskeletal  (+) Arthritis ,   Abdominal   Peds  Hematology  (+) anemia ,  On plavix    Anesthesia Other Findings Covid test negative   Reproductive/Obstetrics                            Anesthesia Physical Anesthesia Plan  ASA: IV  Anesthesia Plan: General   Post-op Pain Management:    Induction: Intravenous  PONV Risk Score and Plan: 2 and Ondansetron, Midazolam and Treatment may vary due to age or  medical condition  Airway Management Planned: LMA  Additional Equipment: Arterial line  Intra-op Plan:   Post-operative Plan: Extubation in OR  Informed Consent: I have reviewed the patients History and Physical, chart, labs and discussed the procedure including the risks, benefits and alternatives for the proposed anesthesia with the patient or authorized representative who has indicated his/her understanding and acceptance.     Dental advisory given  Plan Discussed with: CRNA and Anesthesiologist  Anesthesia Plan Comments:        Anesthesia Quick Evaluation

## 2019-12-09 NOTE — ED Notes (Signed)
Patient transported to x-ray. ?

## 2019-12-09 NOTE — ED Notes (Signed)
Wet to dry dressing changed on left upper thigh area and nephrostomy wound to left flank cleaned and dry gauze placed.

## 2019-12-09 NOTE — Brief Op Note (Addendum)
12/09/2019  4:57 PM  PATIENT:  [name]  54 y.o. male  PRE-OPERATIVE DIAGNOSIS:  Septic arthritis right knee  POST-OPERATIVE DIAGNOSIS:  Hemarthrosis, possible septic arthritis right knee  PROCEDURE:  Procedure(s): INCISION AND DRAINAGE RIGHT KNEE (Right)  SURGEON:  Surgeon(s) and Role:    [place], MD - Primary  PHYSICIAN ASSISTANT: PA Student  ANESTHESIA:   general  EBL:  20 mL   BLOOD ADMINISTERED:none  DRAINS: none   LOCAL MEDICATIONS USED:  NONE  SPECIMEN:  Source of Specimen:  aerobic, anaerobic from knee joint  DISPOSITION OF SPECIMEN:  micro  COUNTS:  YES  TOURNIQUET:  * No tourniquets in log *  DICTATION: #  PLAN OF CARE: Admit to inpatient   PATIENT DISPOSITION:  PACU - hemodynamically stable.   Delay start of Pharmacological VTE agent (>24hrs) due to surgical blood loss or risk of bleeding: no

## 2019-12-09 NOTE — Anesthesia Procedure Notes (Signed)
Arterial Line Insertion Start/End12/07/2019 3:30 PM Performed by: Babs Bertin, CRNA  Patient location: Pre-op. Preanesthetic checklist: patient identified, IV checked, site marked, risks and benefits discussed, surgical consent, monitors and equipment checked, pre-op evaluation, timeout performed and anesthesia consent Lidocaine 1% used for infiltration Right, radial was placed Catheter size: 20 Fr Hand hygiene performed  and maximum sterile barriers used   Attempts: 1 Procedure performed without using ultrasound guided technique. Following insertion, dressing applied and Biopatch. Post procedure assessment: normal and unchanged  Patient tolerated the procedure well with no immediate complications.

## 2019-12-09 NOTE — Progress Notes (Signed)
Nephrology brief note,  Consult received. Unable to see the patient as he is in OR.  Chart reviewed.  No urgent need for dialysis today.  We will see him as a consult tomorrow. Please call back with question. Katheran James, MD [facility].

## 2019-12-09 NOTE — Transfer of Care (Signed)
Immediate Anesthesia Transfer of Care Note  Patient: [name]  Procedure(s) Performed: IRRIGATION AND DEBRIDEMENT EXTREMITY (Right )  Patient Location: PACU  Anesthesia Type:General  Level of Consciousness: drowsy and patient cooperative  Airway & Oxygen Therapy: Patient Spontanous Breathing  Post-op Assessment: Report given to RN, Post -op Vital signs reviewed and stable and Patient moving all extremities X 4  Post vital signs: Reviewed and stable  Last Vitals:  Vitals Value Taken Time  BP    Temp    Pulse    Resp    SpO2      Last Pain:  Vitals:   12/09/19 1450  TempSrc:   PainSc: 9       Patients Stated Pain Goal: 2 (84/53/64 6803)  Complications: No complications documented.

## 2019-12-09 NOTE — Progress Notes (Addendum)
Pharmacy Antibiotic Note  [name] is a 54 y.o. male admitted on 12/08/2019 with R knee pain, suspected septic arthritis; pt is S/P I&D this afternoon.  Pharmacy has been consulted for Zosyn and vancomycin dosing.  Other medical problems include: ESRD (MWF HD), PAD, kidney stones, spina bifida, hx nephrostomy tube, HTN, GERD, anxiety.  WBC 6.5, Tmax 1001. F  Pt rec'd vancomycin 1 gm IV X 1 in ED at ~10 AM today; pt has not had HD since that dose and there is no urgent need for HD today, per Nephrology note; will give additional dose of vancomycin 1500 mg IV X 1 now to make total loading dose of 2500 mg  Plan: Zosyn 2.25 gm IV Q 8 hrs Vancomycin 1500 mg IV X 1 to complete total loading dose of 2500 mg, then vancomycin 1 gram IV with each HD Monitor WBC, temp, cultures  Height: 5\' 11"  (180.3 cm) Weight: 116.5 kg (256 lb 13.4 oz) IBW/kg (Calculated) : 75.3  Temp (24hrs), Avg:99.8 F (37.7 C), Min:99.3 F (37.4 C), Max:100.7 F (38.2 C)  Recent Labs  Lab 12/08/19 1252  WBC 6.5  CREATININE 5.21*  LATICACIDVEN 1.2    Estimated Creatinine Clearance: 21 mL/min (A) (by C-G formula based on SCr of 5.21 mg/dL (H)).    Allergies  Allergen Reactions  . Hydrocodone Other (See Comments)    Caused involuntary movement and twitching. CANNOT TAKE DUE TO MUSCLE SPASMS AND MUSCLE TREMORS  . Colace [Docusate] Diarrhea and Nausea And Vomiting  . Furadantin [Nitrofurantoin] Other (See Comments)    UNSPECIFIED REACTION   . Mandelamine [Methenamine] Other (See Comments)    UNSPECIFIED REACTION   . Noroxin [Norfloxacin] Other (See Comments)    UNSPECIFIED REACTION   . Sulfa Antibiotics Other (See Comments) and Cough    Childhood reaction - pt could not confirm that it was a cough  . Sulfur Cough    Childhood reaction - pt could not confirm that it was a cough    Antimicrobials this admission: 12/7 cefazolin 2 gm IV X 1 12/17 vancomycin >> 12/7 Zosyn >>  Microbiology results: 12/7  BCx X 2:  pending 12/7 R knee synovial fluid: abundant WBCs (PMN), NOS; cx pending 12/7 Surgical cx: pending 12/7 MRSA PCR: negative 12/7 COVID, flu A, flu B: negative  Thank you for allowing pharmacy to be a part of this patient's care.  Gillermina Hu, PharmD, BCPS, [facility] Clinical Pharmacist 12/09/2019 4:53 PM

## 2019-12-09 NOTE — H&P (Signed)
Date: 12/09/2019               Patient Name:  Raylin Winer MRN: [id number]  DOB: December 04, 1965 Age / Sex: 54 y.o., male   PCP: [name], MD         Medical Service: Internal Medicine Teaching Service         Attending Physician: Dr. Lucious Groves, DO    First Contact: Dr. Lisabeth Devoid Pager: [phone]  Second Contact: Dr. Marianna Payment Pager: [phone]       After Hours (After 5p/  First Contact Pager: [phone]  weekends / holidays): Second Contact Pager: [phone]   Chief Complaint: left knee pain  History of Present Illness: Mr. Stanislaw is a 54 y/o gentleman with history of ESRD on HD, HTN, nonhealing surgical wounds from prior AV grafts, recent history of septic arthritis in left knee, severe AS, CAD, spina bifida with  Myelomeningocele who presents with progressive right knee pain that began 4 days ago. Endorses associated fevers, chills, and nausea. He states the pain feels similar to when he was diagnosed with septic joint in his left knee. Pain is worse with ROM, particularly flexion. He was in HD yesterday, and was sent directly to the ED due to worsening pain and inability to move or bare weight on the leg. His dialysis access is a TDC on right leg which he has been told ias a last resort to be able to continue HD.  No syncope, headaches, vision changes, URI symptoms, chest pain, shortness of breath, vomiting, changes in bowel or bladder habits, numbness/tingling.   Meds:  Current Meds  Medication Sig  . acetaminophen (TYLENOL) 500 MG tablet Take 1,000 mg by mouth every 6 (six) hours as needed for mild pain or headache.  Marland Kitchen aspirin 325 MG tablet Take 325 mg by mouth daily.  . calcium acetate (PHOSLO) 667 MG capsule Take 3-4 capsules (2,001-2,668 mg total) by mouth See admin instructions. Take 2708 mg by mouth 3 times daily with meals, take 2001 mg by mouth with snacks (Patient taking differently: Take 2,668 mg by mouth 3 (three) times daily with meals. )  . clopidogrel (PLAVIX) 75 MG  tablet Take 75 mg by mouth daily.  Marland Kitchen escitalopram (LEXAPRO) 5 MG tablet Take 1 tablet (5 mg total) by mouth daily.  . famotidine (PEPCID) 40 MG tablet Take 40 mg by mouth daily.  . fluticasone (FLONASE) 50 MCG/ACT nasal spray Place 2 sprays into both nostrils as needed for allergies or rhinitis (congestion). (Patient taking differently: Place 2 sprays into both nostrils daily as needed for allergies or rhinitis (congestion). )  . furosemide (LASIX) 80 MG tablet Take 80 mg by mouth 2 (two) times daily as needed for fluid.   Marland Kitchen levothyroxine (SYNTHROID) 125 MCG tablet Take 137 mcg by mouth daily.  Marland Kitchen levothyroxine (SYNTHROID) 137 MCG tablet Take 137 mcg by mouth daily before breakfast.  . LORazepam (ATIVAN) 1 MG tablet Take 1 mg by mouth daily.   . Nutritional Supplements (NEPRO) LIQD Take 237 mLs by mouth daily. Mixed Berry  . oxyCODONE-acetaminophen (PERCOCET) 10-325 MG tablet Take 1 tablet by mouth every 6 (six) hours as needed for pain. (Patient taking differently: Take 1 tablet by mouth every 6 (six) hours. )  . promethazine (PHENERGAN) 25 MG tablet Take 1 tablet (25 mg total) by mouth 2 (two) times daily as needed for nausea or vomiting.  . rosuvastatin (CRESTOR) 10 MG tablet Take 1 tablet (10 mg total) by mouth daily.  Marland Kitchen  sodium polystyrene (KAYEXALATE) powder Take 15 g by mouth See admin instructions. Take 15 g (mixed in water) by mouth on Saturdays and Sundays  . traZODone (DESYREL) 100 MG tablet Take 1 tablet (100 mg total) by mouth at bedtime.  Marland Kitchen zolpidem (AMBIEN) 10 MG tablet Take 1 tablet (10 mg total) by mouth at bedtime.     Allergies: Allergies as of 12/08/2019 - Review Complete 11/18/2019  Allergen Reaction Noted  . Hydrocodone Other (See Comments) 11/05/2012  . Colace [docusate] Diarrhea 10/13/2019  . Furadantin [nitrofurantoin] Other (See Comments) 06/27/2012  . Mandelamine [methenamine] Other (See Comments) 06/27/2012  . Noroxin [norfloxacin] Other (See Comments) 06/27/2012   . Other Other (See Comments) 10/22/2019  . Sulfa antibiotics Other (See Comments) and Cough 06/24/2012  . Sulfur Cough 02/08/2015   Past Medical History:  Diagnosis Date  . Anemia   . Anxiety   . Constipation   . End stage renal failure on dialysis [facility])    [place]; MWF, on HD since 1997, exhausted upper ext access, and possibly LE access; cath dependent in R groin as of 06/2018  . GERD (gastroesophageal reflux disease)   . Grand mal seizure ([place]) X 4   last 1998 (02/29/2016)  . History of blood transfusion 2000s   "I was having blood loss; never found out from where"  . History of kidney stones   . Hypertension    hx of - has not taken bp meds in over 2 years  since dialysis  . Hypothyroidism   . Insomnia   . Migraine    "due to my BP in my late 1990s; none since" (02/29/2016)  . Nonhealing surgical wound    of the left arteriovenous graft  . Severe aortic stenosis    no problems with per pt. - nephrologist and Dr. Coletta Memos  . Spina bifida ([place])     Family History:  Family History  Problem Relation Age of Onset  . Diabetes Mother   . Hypertension Mother   . Heart disease Mother        before age 33  . Diabetes Father   . Heart attack Father        X's 3  . Diabetes Sister   . Bipolar disorder Sister      Social History: lives at home, uses a wheelchair and walker. No tobacco or EtOH, very rare marijuana use   Review of Systems: A complete ROS was negative except as per HPI.   Physical Exam: Blood pressure (!) 119/49, pulse 62, temperature 98.8 F (37.1 C), resp. rate 14, height 5\' 11"  (1.803 m), weight 116.5 kg, SpO2 95 %. General: awake, alert, chronically ill appearing, NAD HEENT: sclera non-injected, poor dentition, moist mucous membranes CV: RRR; SEM at LUSB Pulm: normal work of breathing; lungs CTAB Abd: BS+; abdomen soft, non-tender, non-distended Neuro: A&Ox4; no focal deficits Ext: right knee with diffuse TTP, increased warmth. There is as moderate  sized effusion. ROM significantly limited due to pain, most notably flexion. Sensation intact. Palpable DP pulse.  Psych: pleasant mood and affect   EKG: personally reviewed my interpretation is NSR, no acute ischemic changes   CXR: personally reviewed my interpretation is poor study, no effusions or focal infiltrates   Assessment & Plan by Problem: Active Problems:   Septic arthritis [facility])  Mr. Honeycutt is a 54 y/o gentleman with history of ESRD on HD, HTN, nonhealing surgical wounds from prior AV grafts, recent history of septic arthritis in left knee, severe AS,  CAD, spina bifida with  Myelomeningocele who presents with progressive right knee pain that began 4 days ago. He was febrile without leukocytosis. Ortho was consulted and took rapidly to the OR for washout due to concern for septic arthritis.   Monoarthritis with concern for septic joint -patient was febrile on arrival, but did not show signs of sepsis, as he was otherwise HDS with normal lactic acid  -appreciate ortho consult. S/p washout and surgical cultures were obtained -he will continue broad spectrum antibiotics until cultures result -blood cx obtained and are pending -will also obtain cx from HD access site since it is right above right knee  -dilaudid prn pain, phenergan prn nausea  ESRD on HD -appreciate nephrology consult for inpatient HD management -no indication for urgent HD at this time   Chronic anxiety -continue hom lexapro and ativan   CAD, PAD -continue home plavix, aspirin and statin   DVT ppx: SCDs, resume anticoagulation 24 hrs post op Diet: Renal  CODE: FULL    Dispo: Admit patient to Inpatient with expected length of stay greater than 2 midnights.  SignedDelice Bison, DO 12/09/2019, 6:11 PM  Pager: [phone] After 5pm on weekdays and 1pm on weekends: On Call pager: [phone]

## 2019-12-09 NOTE — Anesthesia Postprocedure Evaluation (Signed)
Anesthesia Post Note  Patient: [name]  Procedure(s) Performed: IRRIGATION AND DEBRIDEMENT EXTREMITY (Right )     Patient location during evaluation: PACU Anesthesia Type: General Level of consciousness: awake and alert Pain management: pain level controlled Vital Signs Assessment: post-procedure vital signs reviewed and stable Respiratory status: spontaneous breathing, nonlabored ventilation and respiratory function stable Cardiovascular status: blood pressure returned to baseline and stable Postop Assessment: no apparent nausea or vomiting Anesthetic complications: no   No complications documented.  Last Vitals:  Vitals:   12/09/19 1800 12/09/19 1834  BP: (!) 119/49 114/75  Pulse: 62 66  Resp: 14 18  Temp:  36.9 C  SpO2: 95% 99%                   Audry Pili

## 2019-12-09 NOTE — ED Notes (Signed)
Pt was sitting in a recliner while out in the waiting room, xray department unable to perform xray while pt in recliner and pt refused to transfer to their table. Xray not performed yesterday or last night. Pt transferred to ED stretcher when brought back to treatment area, pt taken to xray at this time

## 2019-12-09 NOTE — ED Provider Notes (Signed)
[facility] EMERGENCY DEPARTMENT Provider Note   CSN: [id number] Arrival date & time: 12/08/19  1245     History No chief complaint on file.   Mynor Witkop is a 54 y.o. male.  Patient is a 54 year old male with extensive past medical history including end-stage renal disease on hemodialysis, hypertension, and issues with nonhealing surgical wounds from prior arteriovenous graft.  He also has a history of renal calculi and has had a nephrostomy tube in the past.  This tube site has been chronically draining and has had recurrent infections.  He was also diagnosed 2 months ago with septic arthritis of the left knee requiring surgical procedure followed by IV antibiotics.  Patient has been followed by infectious disease.  He has been off of antibiotics for approximately 1 month.  He presents today for evaluation of right leg pain.  He reports pain in the right knee for the past several days that began in the absence of any injury or trauma.  The knee feels swollen and similar to how it felt with prior septic arthritis.  He was at dialysis today and experienced increasing pain in the leg along with pain in his chest, then was sent here for evaluation.  His chest pain has since resolved, but he continues with significant discomfort in the right leg.  He was also found to be febrile upon presentation with a temperature of 100.7 and spiking as high as 101.2.  He denies any cough, sore throat, abdominal pain, or diarrhea.  He reports continued drainage at his nephrostomy tube site.  He has also been performing wet-to-dry dressings to his left thigh.  He denies increased drainage, redness, or pain at the site.  The history is provided by the patient.       Past Medical History:  Diagnosis Date  . Anemia   . Anxiety   . Constipation   . End stage renal failure on dialysis [facility])    [place]; MWF, on HD since 1997, exhausted upper ext access, and possibly LE access; cath  dependent in R groin as of 06/2018  . GERD (gastroesophageal reflux disease)   . Grand mal seizure ([place]) X 4   last 1998 (02/29/2016)  . History of blood transfusion 2000s   "I was having blood loss; never found out from where"  . History of kidney stones   . Hypertension    hx of - has not taken bp meds in over 2 years  since dialysis  . Hypothyroidism   . Insomnia   . Migraine    "due to my BP in my late 1990s; none since" (02/29/2016)  . Nonhealing surgical wound    of the left arteriovenous graft  . Severe aortic stenosis    no problems with per pt. - nephrologist and Dr. Coletta Memos  . Spina bifida [facility])     Patient Active Problem List   Diagnosis Date Noted  . Chest pressure   . Coronary artery calcification   . Chest pain   . Sepsis without acute organ dysfunction ([place])   . Vascular graft infection ([place])   . Wound infection   . Pyogenic arthritis of left knee joint ([place])   . Pain and swelling of knee, left   . Complication associated with dialysis catheter   . Hyperkalemia   . Dysphagia   . Ureteral stone with hydronephrosis 03/26/2019  . Malfunction of nephrostomy tube ([place])   . Nephrostomy complication ([place]) 62/37/6283  . Abnormal CT scan,  gastrointestinal tract   . Flank pain 11/26/2018  . Nephrolithiasis 11/26/2018  . Hydronephrosis with renal and ureteral calculus obstruction 11/26/2018  . Left flank pain 11/26/2018  . ESRD on dialysis ([place]) 06/12/2018  . PAD (peripheral artery disease) ([place]) 12/22/2017  . Problem with dialysis access ([place]) 12/21/2017  . Anemia 12/21/2017  . Hypothyroidism 12/21/2017  . GERD (gastroesophageal reflux disease) 12/21/2017  . Anxiety 12/21/2017  . Left great toe amputee ([place]) 12/19/2017  . Endocarditis of mitral valve   . Osteomyelitis of toe of left foot ([place])   . MSSA bacteremia 12/08/2017  . Fever   . Right flank pain   . ESRD (end stage renal disease) ([place]) 09/20/2017  . ESRD (end stage renal disease) on dialysis ([place])  08/07/2017  . Nonhealing surgical wound 02/29/2016  . Clotted dialysis access ([place]) 01/14/2016  . Removal of staples 07/19/2012  . Complication of AV dialysis fistula 06/27/2012  . End stage renal disease ([place]) 06/27/2012    Past Surgical History:  Procedure Laterality Date  . AMPUTATION Left 12/11/2017   Procedure: Left Great Toe Amputation;  Surgeon: [name], MD;  Location: [facility];  Service: Orthopedics;  Laterality: Left;  . APPENDECTOMY    . APPLICATION OF WOUND VAC Left 10/04/2019   Procedure: APPLICATION OF WOUND VAC;  Surgeon: Serafina Mitchell, MD;  Location: [place];  Service: Vascular;  Laterality: Left;  . ARTERIOVENOUS GRAFT PLACEMENT Left 10/16/2012   left femoral goretex graft         Dr Donnetta Hutching  . AV FISTULA PLACEMENT Left 06/27/2012   Procedure: EXPLORATORY LEFT THY-GRAFT PSEUDO-ANEURYSM;  Surgeon: Conrad [place], MD;  Location: [place];  Service: Vascular;  Laterality: Left;  Revision of left Arteriovenus gortex graft in thigh.  Bertram Savin REMOVAL Left 10/04/2019   Procedure: REMOVAL OF ARTERIOVENOUS GORETEX GRAFT ([place]);  Surgeon: Serafina Mitchell, MD;  Location: [facility] OR;  Service: Vascular;  Laterality: Left;  . COLONOSCOPY    . DRESSING CHANGE UNDER ANESTHESIA Left 10/07/2019   Procedure: DRESSING CHANGE UNDER ANESTHESIA;  Surgeon: Meredith Pel, MD;  Location: [place];  Service: Orthopedics;  Laterality: Left;  . FLEXIBLE SIGMOIDOSCOPY N/A 12/03/2018   Procedure: FLEXIBLE SIGMOIDOSCOPY;  Surgeon: Yetta Flock, MD;  Location: [place];  Service: Gastroenterology;  Laterality: N/A;  . I & D EXTREMITY Left 02/29/2016   Procedure: DEBRIDEMENT LEFT THIGH WOUND;  Surgeon: Waynetta Sandy, MD;  Location: [place];  Service: Vascular;  Laterality: Left;  . I & D EXTREMITY Left 10/04/2019   Procedure: IRRIGATION AND DEBRIDEMENT LEFT THIGH;  Surgeon: Serafina Mitchell, MD;  Location: [place];  Service: Vascular;  Laterality: Left;  . ILEOSTOMY  1970s  . INCISION AND  DRAINAGE Left 10/16/2012   Procedure: INCISION AND Debridement left thigh graft;  Surgeon: Rosetta Posner, MD;  Location: [place];  Service: Vascular;  Laterality: Left;  . INSERTION OF DIALYSIS CATHETER    . INSERTION OF DIALYSIS CATHETER  01/14/2016   Procedure: INSERTION OF DIALYSIS CATHETER;  Surgeon: Waynetta Sandy, MD;  Location: [place];  Service: Vascular;;  . INSERTION OF DIALYSIS CATHETER Right 08/07/2017   Procedure: INSERTION OF DIALYSIS CATHETER;  Surgeon: Waynetta Sandy, MD;  Location: [place];  Service: Vascular;  Laterality: Right;  . INSERTION OF DIALYSIS CATHETER Right 12/21/2017   Procedure: RIGHT FEMORAL DIALYSIS CATHETER EXCHANGE;  Surgeon: Marty Heck, MD;  Location: [place];  Service: Vascular;  Laterality: Right;  . INSERTION OF  ILIAC STENT Left 01/14/2016   Procedure: INSERTION OF ILIAC STENT;  Surgeon: Waynetta Sandy, MD;  Location: [place];  Service: Vascular;  Laterality: Left;  . INTRAOPERATIVE ARTERIOGRAM Left 01/14/2016   Procedure: INTRA OPERATIVE ARTERIOGRAM;  Surgeon: Waynetta Sandy, MD;  Location: [place];  Service: Vascular;  Laterality: Left;  . IR NEPHROSTOMY EXCHANGE LEFT  03/21/2019  . IR NEPHROSTOMY PLACEMENT LEFT  11/28/2018  . KNEE ARTHROSCOPY Left 10/07/2019   Procedure: ARTHROSCOPY KNEE w/ STIMULATING  BEAD PLACMENT;  Surgeon: Meredith Pel, MD;  Location: [place];  Service: Orthopedics;  Laterality: Left;  . LEFT HEART CATH AND CORONARY ANGIOGRAPHY N/A 10/17/2019   Procedure: LEFT HEART CATH AND CORONARY ANGIOGRAPHY;  Surgeon: Jettie Booze, MD;  Location: [place] CV LAB;  Service: Cardiovascular;  Laterality: N/A;  . LITHOTRIPSY  x 3   "& a laser treatment" (02/29/2016)  . NEPHROLITHOTOMY Left 03/26/2019   Procedure: NEPHROLITHOTOMY PERCUTANEOUS;  Surgeon: Alexis Frock, MD;  Location: [facility];  Service: Urology;  Laterality: Left;  2 HRS  . NEPHROSTOMY Bilateral 1968  . PATELLAR TENDON REPAIR Right  1990s   "big incision"  . PERIPHERAL VASCULAR CATHETERIZATION  01/14/2016   Procedure: A/V SHUNTOGRAM;  Surgeon: Waynetta Sandy, MD;  Location: [place];  Service: Vascular;;  . REVISION OF ARTERIOVENOUS GORETEX GRAFT Left 10/16/2012   Procedure: REVISION OF LEFT FEMORAL LOOP ARTERIOVENOUS GORETEX GRAFT;  Surgeon: Rosetta Posner, MD;  Location: [place];  Service: Vascular;  Laterality: Left;  . REVISION OF ARTERIOVENOUS GORETEX GRAFT Left 02/11/2015   Procedure: EXCISION OF SMALL SEGMENT OF EXPOSED LEFT THIGH NON FUNCTIONING  ARTERIOVENOUS GORETEX GRAFT;  Surgeon: Mal Misty, MD;  Location: [place];  Service: Vascular;  Laterality: Left;  . REVISION OF ARTERIOVENOUS GORETEX GRAFT Left 01/14/2016   Procedure: REVISION OF ARTERIOVENOUS GORETEX GRAFT;  Surgeon: Waynetta Sandy, MD;  Location: [place];  Service: Vascular;  Laterality: Left;  . REVISION OF ARTERIOVENOUS GORETEX GRAFT Left 02/29/2016   thigh/pt report  . REVISION OF ARTERIOVENOUS GORETEX GRAFT Left 02/29/2016   Procedure: POSSIBLE REVISION OF LEFT THIGH ARTERIOVENOUS GORETEX GRAFT;  Surgeon: Waynetta Sandy, MD;  Location: [place];  Service: Vascular;  Laterality: Left;  . TEE WITHOUT CARDIOVERSION N/A 12/14/2017   Procedure: TRANSESOPHAGEAL ECHOCARDIOGRAM (TEE);  Surgeon: Acie Fredrickson, Wonda Cheng, MD;  Location: [place];  Service: Cardiovascular;  Laterality: N/A;  . THROMBECTOMY AND REVISION OF ARTERIOVENTOUS (AV) GORETEX  GRAFT Left 06/12/2018   Procedure: Removal of infected left thigh graft;  Surgeon: Rosetta Posner, MD;  Location: [place];  Service: Vascular;  Laterality: Left;  . THROMBECTOMY W/ EMBOLECTOMY Left 01/14/2016   Procedure: THROMBECTOMY ARTERIOVENOUS GORE-TEX Left thigh GRAFT;  Surgeon: Waynetta Sandy, MD;  Location: [place];  Service: Vascular;  Laterality: Left;  . TONGUE SURGERY  ~ 1990   tongue-tie release   . ULTRASOUND GUIDANCE FOR VASCULAR ACCESS Right 08/07/2017   Procedure: ULTRASOUND GUIDANCE  FOR VASCULAR CANNULATION RIGHT FEMORAL VEIN AND LEFT AV FEMORAL GRAFT;  Surgeon: Waynetta Sandy, MD;  Location: [place];  Service: Vascular;  Laterality: Right;  . UPPER EXTREMITY VENOGRAPHY Bilateral 08/09/2017   Procedure: UPPER EXTREMITY VENOGRAPHY;  Surgeon: Serafina Mitchell, MD;  Location: [place] CV LAB;  Service: Cardiovascular;  Laterality: Bilateral;  . VENOGRAM N/A 09/20/2017   Procedure: RIGHT COMMON FEMORAL ARTERY EXPLORATION.  CANNULATION RIGHT COMMON FEMORAL VEIN. VENOGRAM CENTRAL ULTRA SOUND GUIDED RIGHT FEMORAL VEIN TIMES TWO.;  Surgeon: Servando Snare  Harrell Gave, MD;  Location: [place];  Service: Vascular;  Laterality: N/A;  . VENOGRAM Right 10/04/2019   Procedure: VENOGRAM;  Surgeon: Serafina Mitchell, MD;  Location: [place];  Service: Vascular;  Laterality: Right;  . WOUND DEBRIDEMENT Left 02/29/2016   thigh       Family History  Problem Relation Age of Onset  . Diabetes Mother   . Hypertension Mother   . Heart disease Mother        before age 59  . Diabetes Father   . Heart attack Father        X's 3  . Diabetes Sister   . Bipolar disorder Sister     Social History   Tobacco Use  . Smoking status: Never Smoker  . Smokeless tobacco: Never Used  Vaping Use  . Vaping Use: Never used  Substance Use Topics  . Alcohol use: Not Currently    Alcohol/week: 0.0 standard drinks    Comment: 02/29/2016 "nothing since  the mid 1990s  . Drug use: Not Currently    Types: Marijuana    Comment: 02/29/2016 "nothing since ~ 1995"    Home Medications Prior to Admission medications   Medication Sig Start Date End Date Taking? Authorizing Provider  aspirin 325 MG tablet Take 325 mg by mouth daily.    [provider]  calcium acetate (PHOSLO) 667 MG capsule Take 3-4 capsules (2,001-2,668 mg total) by mouth See admin instructions. Take 2708 mg by mouth 3 times daily with meals, take 2001 mg by mouth with snacks 01/14/18   Hennie Duos, MD  cefTAZidime 2 g in  sodium chloride 0.9 % 100 mL Inject 2 g into the vein every Monday, Wednesday, and Friday with hemodialysis. 10/20/19   Lyndee Hensen, DO  clopidogrel (PLAVIX) 75 MG tablet Take 75 mg by mouth daily.    [provider]  escitalopram (LEXAPRO) 5 MG tablet Take 1 tablet (5 mg total) by mouth daily. 10/19/19 11/18/19  Lyndee Hensen, DO  famotidine (PEPCID) 20 MG tablet Take 1 tablet (20 mg total) by mouth daily. 03/25/19   Gifford Shave, MD  famotidine (PEPCID) 40 MG tablet Take 40 mg by mouth daily. 11/03/19   [provider]  fluticasone (FLONASE) 50 MCG/ACT nasal spray Place 2 sprays into both nostrils as needed for allergies or rhinitis (congestion). 01/14/18   Hennie Duos, MD  furosemide (LASIX) 80 MG tablet Take 80 mg by mouth 2 (two) times daily as needed (fluid).    [provider]  levothyroxine (SYNTHROID) 125 MCG tablet Take 137 mcg by mouth daily. 11/03/19   [provider]  levothyroxine (SYNTHROID) 137 MCG tablet Take 137 mcg by mouth daily before breakfast.    [provider]  LORazepam (ATIVAN) 1 MG tablet Take 1 mg by mouth daily as needed (anxiety).     [provider]  Nutritional Supplements (NEPRO) LIQD Take 237 mLs by mouth daily. Mixed Berry    [provider]  oxyCODONE-acetaminophen (PERCOCET) 10-325 MG tablet Take 1 tablet by mouth every 6 (six) hours as needed for pain. 06/14/18   Dagoberto Ligas, PA-C  promethazine (PHENERGAN) 25 MG tablet Take 1 tablet (25 mg total) by mouth 2 (two) times daily as needed for nausea or vomiting. 01/14/18   Hennie Duos, MD  rosuvastatin (CRESTOR) 10 MG tablet Take 1 tablet (10 mg total) by mouth daily. 10/18/19 11/17/19  Lyndee Hensen, DO  sodium polystyrene (KAYEXALATE) powder Take 15 g by mouth See admin  instructions. Take 15 g (mixed in water) by mouth on Saturdays and Sundays 01/14/18   Hennie Duos, MD  traZODone (DESYREL) 100 MG tablet Take 1 tablet (100 mg  total) by mouth at bedtime. 01/14/18   Hennie Duos, MD  zolpidem (AMBIEN) 10 MG tablet Take 1 tablet (10 mg total) by mouth at bedtime. 01/14/18   Hennie Duos, MD    Allergies    Hydrocodone, Colace [docusate], Furadantin [nitrofurantoin], Mandelamine [methenamine], Noroxin [norfloxacin], Other, Sulfa antibiotics, and Sulfur  Review of Systems   Review of Systems  All other systems reviewed and are negative.   Physical Exam Updated Vital Signs BP 131/77 (BP Location: Right Arm)   Pulse 68   Temp 99.5 F (37.5 C) (Oral)   Resp 18   Ht 5\' 11"  (1.803 m)   Wt 116.5 kg   SpO2 100%   BMI 35.82 kg/m   Physical Exam Vitals and nursing note reviewed.  Constitutional:      General: He is not in acute distress.    Appearance: He is well-developed. He is not diaphoretic.  HENT:     Head: Normocephalic and atraumatic.  Cardiovascular:     Rate and Rhythm: Normal rate and regular rhythm.     Heart sounds: No murmur heard.  No friction rub.  Pulmonary:     Effort: Pulmonary effort is normal. No respiratory distress.     Breath sounds: Normal breath sounds. No wheezing or rales.  Abdominal:     General: Bowel sounds are normal. There is no distension.     Palpations: Abdomen is soft.     Tenderness: There is no abdominal tenderness.  Musculoskeletal:        General: Normal range of motion.     Cervical back: Normal range of motion and neck supple.     Comments: The right knee is swollen with a moderate sized effusion.  He has pain with any range of motion.  There is also some warmth to palpation.  Skin:    General: Skin is warm and dry.     Comments: There is a wound to the left thigh with granulation tissue and wet-to-dry dressing over top.  There is some greenish drainage on the packing, but otherwise appears well.  There is no significant purulent drainage or surrounding erythema.  The nephrostomy site to the left abdominal wall is somewhat erythematous.  There is a  bloody drainage present.  Neurological:     Mental Status: He is alert and oriented to person, place, and time.     Coordination: Coordination normal.     ED Results / Procedures / Treatments   Labs (all labs ordered are listed, but only abnormal results are displayed) Labs Reviewed  BASIC METABOLIC PANEL - Abnormal; Notable for the following components:      Result Value   Chloride 94 (*)    Glucose, Bld 104 (*)    BUN 23 (*)    Creatinine, Ser 5.21 (*)    GFR, Estimated 12 (*)    All other components within normal limits  CBC - Abnormal; Notable for the following components:   RBC 3.52 (*)    Hemoglobin 10.7 (*)    HCT 33.8 (*)    RDW 16.4 (*)    All other components within normal limits  BODY FLUID CULTURE  RESP PANEL BY RT-PCR (FLU A&B, COVID) ARPGX2  CULTURE, BLOOD (ROUTINE X 2)  CULTURE, BLOOD (ROUTINE X 2)  LACTIC ACID, PLASMA  SYNOVIAL CELL COUNT + DIFF, W/ CRYSTALS  TROPONIN I (HIGH SENSITIVITY)  TROPONIN I (HIGH SENSITIVITY)    EKG EKG Interpretation  Date/Time:  Monday December 08 2019 12:48:13 EST Ventricular Rate:  72 PR Interval:  180 QRS Duration: 88 QT Interval:  422 QTC Calculation: 462 R Axis:   59 Text Interpretation: Normal sinus rhythm Normal ECG No significant change since 10/15/2019 Confirmed by Veryl Speak [phone]) on 12/09/2019 7:51:26 AM   Radiology DG Chest 2 View  Result Date: 12/09/2019 CLINICAL DATA:  Fever, chest pain EXAM: CHEST - 2 VIEW COMPARISON:  10/14/2019 FINDINGS: Low lung volumes. Mild atelectasis/scarring at the left lung base. No pleural effusion or pneumothorax. Cardiomediastinal contours are within normal limits. No acute osseous abnormality. IMPRESSION: Mild atelectasis/scarring at the left lung base. Electronically Signed   By: Macy Mis M.D.   On: 12/09/2019 08:06    Procedures Procedures (including critical care time)  Medications Ordered in ED Medications  acetaminophen (TYLENOL) tablet 650 mg (650 mg  Oral Given 12/08/19 2142)  ondansetron (ZOFRAN) injection 4 mg (4 mg Intravenous Given 12/09/19 0838)  HYDROmorphone (DILAUDID) injection 1 mg (1 mg Intravenous Given 12/09/19 0839)  lidocaine (PF) (XYLOCAINE) 1 % injection 5 mL (5 mLs Intradermal Given by Other 12/09/19 5053)    ED Course  I have reviewed the triage vital signs and the nursing notes.  Pertinent labs & imaging results that were available during my care of the patient were reviewed by me and considered in my medical decision making (see chart for details).    MDM Rules/Calculators/A&P  Patient is a 54 year old male with extensive past medical history including end-stage renal disease on hemodialysis, complicated dialysis access with poor wound healing, and recent admission for septic arthritis of the left knee.  He presents today with complaints of right knee pain.  This began hurting several days ago, however became much worse yesterday while at dialysis.  He also experienced some chest discomfort, then was referred here for further evaluation.  Patient's work-up initiated including laboratory studies and x-rays of the chest and knee.  Knee films showed an effusion with malunion of a prior patella fracture, likely chronic in nature.  Arthrocentesis was performed of the right knee.  15 cc of bloody, dark fluid was obtained.  This was sent for analysis and revealed 8500 white cells, however accuracy of this it was compromised by fibrin clumping.  Care was discussed with Hilbert Odor from orthopedics.  He has evaluated the patient and it appears as though patient will go for washout of what is felt to be a septic knee.  I have given vancomycin here in the ER.  Patient to be admitted to the internal medicine teaching service.  CRITICAL CARE Performed by: Veryl Speak Total critical care time: 50 minutes Critical care time was exclusive of separately billable procedures and treating other patients. Critical care was necessary  to treat or prevent imminent or life-threatening deterioration. Critical care was time spent personally by me on the following activities: development of treatment plan with patient and/or surrogate as well as nursing, discussions with consultants, evaluation of patient's response to treatment, examination of patient, obtaining history from patient or surrogate, ordering and performing treatments and interventions, ordering and review of laboratory studies, ordering and review of radiographic studies, pulse oximetry and re-evaluation of patient's condition.   Final Clinical Impression(s) / ED Diagnoses Final diagnoses:  None    Rx / DC Orders ED Discharge Orders    None  Veryl Speak, MD 12/09/19 1327

## 2019-12-09 NOTE — Consult Note (Signed)
Reason for Consult:Right knee pain Referring Physician: D Delo Time called: 2595 Time at bedside: [name] is an 54 y.o. male.  HPI: [name] woke up Sunday with right knee pain and swelling. He had done some rehab exercises the night before but didn't have any pain or unusual feeling with it. He went to HD on Monday and it began to feel better during the procedure but then got much worse as the HD came to an end, so much so he couldn't walk. He was sent to the ED for evaluation. Once here he began to have fevers, chills, and N/V. Arthrocentesis was performed but was equivocal. He notes this feels exactly the same as when he had septic arthritis in the contralateral knee 2 months ago.  Past Medical History:  Diagnosis Date  . Anemia   . Anxiety   . Constipation   . End stage renal failure on dialysis [facility])    [place]; MWF, on HD since 1997, exhausted upper ext access, and possibly LE access; cath dependent in R groin as of 06/2018  . GERD (gastroesophageal reflux disease)   . Grand mal seizure ([place]) X 4   last 1998 (02/29/2016)  . History of blood transfusion 2000s   "I was having blood loss; never found out from where"  . History of kidney stones   . Hypertension    hx of - has not taken bp meds in over 2 years  since dialysis  . Hypothyroidism   . Insomnia   . Migraine    "due to my BP in my late 1990s; none since" (02/29/2016)  . Nonhealing surgical wound    of the left arteriovenous graft  . Severe aortic stenosis    no problems with per pt. - nephrologist and Dr. Coletta Memos  . Spina bifida [facility])     Past Surgical History:  Procedure Laterality Date  . AMPUTATION Left 12/11/2017   Procedure: Left Great Toe Amputation;  Surgeon: [name], MD;  Location: [place];  Service: Orthopedics;  Laterality: Left;  . APPENDECTOMY    . APPLICATION OF WOUND VAC Left 10/04/2019   Procedure: APPLICATION OF WOUND VAC;  Surgeon: Serafina Mitchell, MD;  Location: [place];   Service: Vascular;  Laterality: Left;  . ARTERIOVENOUS GRAFT PLACEMENT Left 10/16/2012   left femoral goretex graft         Dr Donnetta Hutching  . AV FISTULA PLACEMENT Left 06/27/2012   Procedure: EXPLORATORY LEFT THY-GRAFT PSEUDO-ANEURYSM;  Surgeon: Conrad Dunnell, MD;  Location: [place];  Service: Vascular;  Laterality: Left;  Revision of left Arteriovenus gortex graft in thigh.  Bertram Savin REMOVAL Left 10/04/2019   Procedure: REMOVAL OF ARTERIOVENOUS GORETEX GRAFT ([place]);  Surgeon: Serafina Mitchell, MD;  Location: [facility] OR;  Service: Vascular;  Laterality: Left;  . COLONOSCOPY    . DRESSING CHANGE UNDER ANESTHESIA Left 10/07/2019   Procedure: DRESSING CHANGE UNDER ANESTHESIA;  Surgeon: Meredith Pel, MD;  Location: [place];  Service: Orthopedics;  Laterality: Left;  . FLEXIBLE SIGMOIDOSCOPY N/A 12/03/2018   Procedure: FLEXIBLE SIGMOIDOSCOPY;  Surgeon: Yetta Flock, MD;  Location: [place];  Service: Gastroenterology;  Laterality: N/A;  . I & D EXTREMITY Left 02/29/2016   Procedure: DEBRIDEMENT LEFT THIGH WOUND;  Surgeon: Waynetta Sandy, MD;  Location: [place];  Service: Vascular;  Laterality: Left;  . I & D EXTREMITY Left 10/04/2019   Procedure: IRRIGATION AND DEBRIDEMENT LEFT THIGH;  Surgeon: Serafina Mitchell, MD;  Location: [facility] OR;  Service: Vascular;  Laterality: Left;  . ILEOSTOMY  1970s  . INCISION AND DRAINAGE Left 10/16/2012   Procedure: INCISION AND Debridement left thigh graft;  Surgeon: Rosetta Posner, MD;  Location: [place];  Service: Vascular;  Laterality: Left;  . INSERTION OF DIALYSIS CATHETER    . INSERTION OF DIALYSIS CATHETER  01/14/2016   Procedure: INSERTION OF DIALYSIS CATHETER;  Surgeon: Waynetta Sandy, MD;  Location: [place];  Service: Vascular;;  . INSERTION OF DIALYSIS CATHETER Right 08/07/2017   Procedure: INSERTION OF DIALYSIS CATHETER;  Surgeon: Waynetta Sandy, MD;  Location: [place];  Service: Vascular;  Laterality: Right;  . INSERTION OF DIALYSIS CATHETER  Right 12/21/2017   Procedure: RIGHT FEMORAL DIALYSIS CATHETER EXCHANGE;  Surgeon: Marty Heck, MD;  Location: [place];  Service: Vascular;  Laterality: Right;  . INSERTION OF ILIAC STENT Left 01/14/2016   Procedure: INSERTION OF ILIAC STENT;  Surgeon: Waynetta Sandy, MD;  Location: [place];  Service: Vascular;  Laterality: Left;  . INTRAOPERATIVE ARTERIOGRAM Left 01/14/2016   Procedure: INTRA OPERATIVE ARTERIOGRAM;  Surgeon: Waynetta Sandy, MD;  Location: [place];  Service: Vascular;  Laterality: Left;  . IR NEPHROSTOMY EXCHANGE LEFT  03/21/2019  . IR NEPHROSTOMY PLACEMENT LEFT  11/28/2018  . KNEE ARTHROSCOPY Left 10/07/2019   Procedure: ARTHROSCOPY KNEE w/ STIMULATING  BEAD PLACMENT;  Surgeon: Meredith Pel, MD;  Location: [place];  Service: Orthopedics;  Laterality: Left;  . LEFT HEART CATH AND CORONARY ANGIOGRAPHY N/A 10/17/2019   Procedure: LEFT HEART CATH AND CORONARY ANGIOGRAPHY;  Surgeon: Jettie Booze, MD;  Location: [place] CV LAB;  Service: Cardiovascular;  Laterality: N/A;  . LITHOTRIPSY  x 3   "& a laser treatment" (02/29/2016)  . NEPHROLITHOTOMY Left 03/26/2019   Procedure: NEPHROLITHOTOMY PERCUTANEOUS;  Surgeon: Alexis Frock, MD;  Location: [facility];  Service: Urology;  Laterality: Left;  2 HRS  . NEPHROSTOMY Bilateral 1968  . PATELLAR TENDON REPAIR Right 1990s   "big incision"  . PERIPHERAL VASCULAR CATHETERIZATION  01/14/2016   Procedure: A/V SHUNTOGRAM;  Surgeon: Waynetta Sandy, MD;  Location: [place];  Service: Vascular;;  . REVISION OF ARTERIOVENOUS GORETEX GRAFT Left 10/16/2012   Procedure: REVISION OF LEFT FEMORAL LOOP ARTERIOVENOUS GORETEX GRAFT;  Surgeon: Rosetta Posner, MD;  Location: [place];  Service: Vascular;  Laterality: Left;  . REVISION OF ARTERIOVENOUS GORETEX GRAFT Left 02/11/2015   Procedure: EXCISION OF SMALL SEGMENT OF EXPOSED LEFT THIGH NON FUNCTIONING  ARTERIOVENOUS GORETEX GRAFT;  Surgeon: Mal Misty, MD;  Location:  [place];  Service: Vascular;  Laterality: Left;  . REVISION OF ARTERIOVENOUS GORETEX GRAFT Left 01/14/2016   Procedure: REVISION OF ARTERIOVENOUS GORETEX GRAFT;  Surgeon: Waynetta Sandy, MD;  Location: [place];  Service: Vascular;  Laterality: Left;  . REVISION OF ARTERIOVENOUS GORETEX GRAFT Left 02/29/2016   thigh/pt report  . REVISION OF ARTERIOVENOUS GORETEX GRAFT Left 02/29/2016   Procedure: POSSIBLE REVISION OF LEFT THIGH ARTERIOVENOUS GORETEX GRAFT;  Surgeon: Waynetta Sandy, MD;  Location: [place];  Service: Vascular;  Laterality: Left;  . TEE WITHOUT CARDIOVERSION N/A 12/14/2017   Procedure: TRANSESOPHAGEAL ECHOCARDIOGRAM (TEE);  Surgeon: Acie Fredrickson, Wonda Cheng, MD;  Location: [place];  Service: Cardiovascular;  Laterality: N/A;  . THROMBECTOMY AND REVISION OF ARTERIOVENTOUS (AV) GORETEX  GRAFT Left 06/12/2018   Procedure: Removal of infected left thigh graft;  Surgeon: Rosetta Posner, MD;  Location: [place];  Service: Vascular;  Laterality: Left;  .  THROMBECTOMY W/ EMBOLECTOMY Left 01/14/2016   Procedure: THROMBECTOMY ARTERIOVENOUS GORE-TEX Left thigh GRAFT;  Surgeon: Waynetta Sandy, MD;  Location: [place];  Service: Vascular;  Laterality: Left;  . TONGUE SURGERY  ~ 1990   tongue-tie release   . ULTRASOUND GUIDANCE FOR VASCULAR ACCESS Right 08/07/2017   Procedure: ULTRASOUND GUIDANCE FOR VASCULAR CANNULATION RIGHT FEMORAL VEIN AND LEFT AV FEMORAL GRAFT;  Surgeon: Waynetta Sandy, MD;  Location: [place];  Service: Vascular;  Laterality: Right;  . UPPER EXTREMITY VENOGRAPHY Bilateral 08/09/2017   Procedure: UPPER EXTREMITY VENOGRAPHY;  Surgeon: Serafina Mitchell, MD;  Location: [place] CV LAB;  Service: Cardiovascular;  Laterality: Bilateral;  . VENOGRAM N/A 09/20/2017   Procedure: RIGHT COMMON FEMORAL ARTERY EXPLORATION.  CANNULATION RIGHT COMMON FEMORAL VEIN. VENOGRAM CENTRAL ULTRA SOUND GUIDED RIGHT FEMORAL VEIN TIMES TWO.;  Surgeon: Waynetta Sandy, MD;   Location: [place];  Service: Vascular;  Laterality: N/A;  . VENOGRAM Right 10/04/2019   Procedure: VENOGRAM;  Surgeon: Serafina Mitchell, MD;  Location: [place];  Service: Vascular;  Laterality: Right;  . WOUND DEBRIDEMENT Left 02/29/2016   thigh    Family History  Problem Relation Age of Onset  . Diabetes Mother   . Hypertension Mother   . Heart disease Mother        before age 35  . Diabetes Father   . Heart attack Father        X's 3  . Diabetes Sister   . Bipolar disorder Sister     Social History:  reports that he has never smoked. He has never used smokeless tobacco. He reports previous alcohol use. He reports previous drug use. Drug: Marijuana.  Allergies:  Allergies  Allergen Reactions  . Hydrocodone Other (See Comments)    Caused involuntary movement and twitching. CANNOT TAKE DUE TO MUSCLE SPASMS AND MUSCLE TREMORS  . Colace [Docusate] Diarrhea  . Furadantin [Nitrofurantoin] Other (See Comments)    UNSPECIFIED REACTION   . Mandelamine [Methenamine] Other (See Comments)    UNSPECIFIED REACTION   . Noroxin [Norfloxacin] Other (See Comments)    UNSPECIFIED REACTION   . Other Other (See Comments)  . Sulfa Antibiotics Other (See Comments) and Cough    Childhood reaction - pt could not confirm that it was a cough  . Sulfur Cough    Childhood reaction - pt could not confirm that it was a cough    Medications: I have reviewed the patient's current medications.  Results for orders placed or performed during the hospital encounter of 12/08/19 (from the past 48 hour(s))  Basic metabolic panel     Status: Abnormal   Collection Time: 12/08/19 12:52 PM  Result Value Ref Range   Sodium 135 135 - 145 mmol/L   Potassium 4.4 3.5 - 5.1 mmol/L   Chloride 94 (L) 98 - 111 mmol/L   CO2 26 22 - 32 mmol/L   Glucose, Bld 104 (H) 70 - 99 mg/dL    Comment: Glucose reference range applies only to samples taken after fasting for at least 8 hours.   BUN 23 (H) 6 - 20 mg/dL   Creatinine,  Ser 5.21 (H) 0.61 - 1.24 mg/dL   Calcium 9.2 8.9 - 10.3 mg/dL   GFR, Estimated 12 (L) >60 mL/min    Comment: (NOTE) Calculated using the CKD-EPI Creatinine Equation (2021)    Anion gap 15 5 - 15    Comment: Performed at [facility] [address], [place], [place] [address]  CBC     Status: Abnormal   Collection Time: 12/08/19 12:52 PM  Result Value Ref Range   WBC 6.5 4.0 - 10.5 K/uL   RBC 3.52 (L) 4.22 - 5.81 MIL/uL   Hemoglobin 10.7 (L) 13.0 - 17.0 g/dL   HCT 33.8 (L) 39 - 52 %   MCV 96.0 80.0 - 100.0 fL   MCH 30.4 26.0 - 34.0 pg   MCHC 31.7 30.0 - 36.0 g/dL   RDW 16.4 (H) 11.5 - 15.5 %   Platelets 158 150 - 400 K/uL   nRBC 0.0 0.0 - 0.2 %    Comment: Performed at [facility] [address] [place], [place] [address]  Troponin I (High Sensitivity)     Status: None   Collection Time: 12/08/19 12:52 PM  Result Value Ref Range   Troponin I (High Sensitivity) 14 <18 ng/L    Comment: (NOTE) Elevated high sensitivity troponin I (hsTnI) values and significant  changes across serial measurements may suggest ACS but many other  chronic and acute conditions are known to elevate hsTnI results.  Refer to the "Links" section for chest pain algorithms and additional  guidance. Performed at [facility], [place] [address], [place], [place] [address]   Lactic acid, plasma     Status: None   Collection Time: 12/08/19 12:52 PM  Result Value Ref Range   Lactic Acid, Venous 1.2 0.5 - 1.9 mmol/L    Comment: Performed at [facility] [address] [place], [place] [address]  Troponin I (High Sensitivity)     Status: None   Collection Time: 12/08/19  9:40 PM  Result Value Ref Range   Troponin I (High Sensitivity) 17 <18 ng/L    Comment: (NOTE) Elevated high sensitivity troponin I (hsTnI) values and significant  changes across serial measurements may suggest ACS but many other  chronic and acute conditions are known to elevate hsTnI results.  Refer to the  "Links" section for chest pain algorithms and additional  guidance. Performed at [facility], [place] [address], [place], [place] [address]   Synovial cell count + diff, w/ crystals     Status: Abnormal (Preliminary result)   Collection Time: 12/09/19  8:45 AM  Result Value Ref Range   Color, Synovial RED (A) YELLOW   Appearance-Synovial BLOODY (A) CLEAR   Crystals, Fluid NO CRYSTALS SEEN    WBC, Synovial 8,540 (H) 0 - 200 /cu mm    Comment: COUNT MAY BE INACCURATE DUE TO FIBRIN CLUMPS. Performed at [facility], [place] [address] [place], [place] [address]    Neutrophil, Synovial PENDING 0 - 25 %   Lymphocytes-Synovial Fld PENDING 0 - 20 %   Monocyte-Macrophage-Synovial Fluid PENDING 50 - 90 %   Eosinophils-Synovial PENDING 0 - 1 %   Other Cells-SYN PENDING   Resp Panel by RT-PCR (Flu A&B, Covid) Nasopharyngeal Swab     Status: None   Collection Time: 12/09/19  8:51 AM   Specimen: Nasopharyngeal Swab; Nasopharyngeal(NP) swabs in vial transport medium  Result Value Ref Range   SARS Coronavirus 2 by RT PCR NEGATIVE NEGATIVE    Comment: (NOTE) SARS-CoV-2 target nucleic acids are NOT DETECTED.  The SARS-CoV-2 RNA is generally detectable in upper respiratory specimens during the acute phase of infection. The lowest concentration of SARS-CoV-2 viral copies this assay can detect is 138 copies/mL. A negative result does not preclude SARS-Cov-2 infection and should not be used as the sole  basis for treatment or other patient management decisions. A negative result may occur with  improper specimen collection/handling, submission of specimen other than nasopharyngeal swab, presence of viral mutation(s) within the areas targeted by this assay, and inadequate number of viral copies(<138 copies/mL). A negative result must be combined with clinical observations, patient history, and epidemiological information. The expected result is Negative.  Fact Sheet for Patients:   EntrepreneurPulse.com.au  Fact Sheet for Healthcare Providers:  IncredibleEmployment.be  This test is no t yet approved or cleared by the [place] FDA and  has been authorized for detection and/or diagnosis of SARS-CoV-2 by FDA under an Emergency Use Authorization (EUA). This EUA will remain  in effect (meaning this test can be used) for the duration of the COVID-19 declaration under Section 564(b)(1) of the Act, 21 U.S.C.section 360bbb-3(b)(1), unless the authorization is terminated  or revoked sooner.       Influenza A by PCR NEGATIVE NEGATIVE   Influenza B by PCR NEGATIVE NEGATIVE    Comment: (NOTE) The Xpert Xpress SARS-CoV-2/FLU/RSV plus assay is intended as an aid in the diagnosis of influenza from Nasopharyngeal swab specimens and should not be used as a sole basis for treatment. Nasal washings and aspirates are unacceptable for Xpert Xpress SARS-CoV-2/FLU/RSV testing.  Fact Sheet for Patients: EntrepreneurPulse.com.au  Fact Sheet for Healthcare Providers: IncredibleEmployment.be  This test is not yet approved or cleared by the [place] FDA and has been authorized for detection and/or diagnosis of SARS-CoV-2 by FDA under an Emergency Use Authorization (EUA). This EUA will remain in effect (meaning this test can be used) for the duration of the COVID-19 declaration under Section 564(b)(1) of the Act, 21 U.S.C. section 360bbb-3(b)(1), unless the authorization is terminated or revoked.  Performed at [facility], [place] [address] [place], [place] [address]     DG Chest 2 View  Result Date: 12/09/2019 CLINICAL DATA:  Fever, chest pain EXAM: CHEST - 2 VIEW COMPARISON:  10/14/2019 FINDINGS: Low lung volumes. Mild atelectasis/scarring at the left lung base. No pleural effusion or pneumothorax. Cardiomediastinal contours are within normal limits. No acute osseous abnormality. IMPRESSION:  Mild atelectasis/scarring at the left lung base. Electronically Signed   By: Macy Mis M.D.   On: 12/09/2019 08:06   DG Knee Complete 4 Views Right  Result Date: 12/09/2019 CLINICAL DATA:  Right knee pain.  History of remote patellar injury. EXAM: RIGHT KNEE - COMPLETE 4+ VIEW COMPARISON:  None. FINDINGS: Remote transverse patellar fracture with prominent inferior spurring. There is sclerosis along the readily visualized fracture plane. Moderate knee joint effusion. Generalized osteopenia. No acute fracture. IMPRESSION: 1. Remote patellar fracture with malunion. 2. Moderate joint effusion. Electronically Signed   By: Monte Fantasia M.D.   On: 12/09/2019 09:26    Review of Systems  Constitutional: Positive for chills and fever. Negative for diaphoresis.  HENT: Negative for ear discharge, ear pain, hearing loss and tinnitus.   Eyes: Negative for photophobia and pain.  Respiratory: Negative for cough and shortness of breath.   Cardiovascular: Negative for chest pain.  Gastrointestinal: Positive for nausea and vomiting. Negative for abdominal pain.  Genitourinary: Negative for dysuria, flank pain, frequency and urgency.  Musculoskeletal: Positive for arthralgias (Right knee). Negative for back pain, myalgias and neck pain.  Neurological: Negative for dizziness and headaches.  Hematological: Does not bruise/bleed easily.  Psychiatric/Behavioral: The patient is not nervous/anxious.    Blood pressure 118/78, pulse 68, temperature 99.5 F (37.5 C), temperature source Oral, resp. rate 16,  height 5\' 11"  (1.803 m), weight 116.5 kg, SpO2 97 %. Physical Exam Constitutional:      General: He is not in acute distress.    Appearance: He is well-developed. He is not diaphoretic.  HENT:     Head: Normocephalic and atraumatic.  Eyes:     General: No scleral icterus.       Right eye: No discharge.        Left eye: No discharge.     Conjunctiva/sclera: Conjunctivae normal.  Cardiovascular:      Rate and Rhythm: Normal rate and regular rhythm.  Pulmonary:     Effort: Pulmonary effort is normal. No respiratory distress.  Musculoskeletal:     Cervical back: Normal range of motion.     Comments: RLE No traumatic wounds, ecchymosis, or rash  Mod TTP diffuse knee, severe pain with ROM limited to 170-150 degrees  Mod knee effusion  Sens DPN, SPN, TN intact  Motor EHL, ext, flex, evers 5/5  DP 1+, PT 0, No significant edema  Skin:    General: Skin is warm and dry.  Neurological:     Mental Status: He is alert.  Psychiatric:        Behavior: Behavior normal.     Assessment/Plan: Right knee pain -- Suspect septic arthritis. Will plan on I&D this afternoon by Dr. Marcelino Scot. Please keep NPO. Multiple medical problems including ESRD on HD (MWF), PAD, kidney stones, spina bifida, hx of nephrostomy tube, HTN, GERD, and anxiety -- per primary service    Lisette Abu, PA-C Orthopedic Surgery [phone] 12/09/2019, 11:18 AM

## 2019-12-09 NOTE — Progress Notes (Signed)
Report given to NT concerning vitals to be taken q1.

## 2019-12-10 ENCOUNTER — Encounter (HOSPITAL_COMMUNITY): Payer: Self-pay | Admitting: Orthopedic Surgery

## 2019-12-10 DIAGNOSIS — Q059 Spina bifida, unspecified: Secondary | ICD-10-CM

## 2019-12-10 DIAGNOSIS — E875 Hyperkalemia: Secondary | ICD-10-CM

## 2019-12-10 DIAGNOSIS — M109 Gout, unspecified: Secondary | ICD-10-CM | POA: Diagnosis present

## 2019-12-10 DIAGNOSIS — F419 Anxiety disorder, unspecified: Secondary | ICD-10-CM

## 2019-12-10 DIAGNOSIS — I2510 Atherosclerotic heart disease of native coronary artery without angina pectoris: Secondary | ICD-10-CM

## 2019-12-10 DIAGNOSIS — N186 End stage renal disease: Secondary | ICD-10-CM

## 2019-12-10 DIAGNOSIS — I739 Peripheral vascular disease, unspecified: Secondary | ICD-10-CM

## 2019-12-10 DIAGNOSIS — I120 Hypertensive chronic kidney disease with stage 5 chronic kidney disease or end stage renal disease: Secondary | ICD-10-CM

## 2019-12-10 DIAGNOSIS — R011 Cardiac murmur, unspecified: Secondary | ICD-10-CM

## 2019-12-10 DIAGNOSIS — M25061 Hemarthrosis, right knee: Secondary | ICD-10-CM | POA: Diagnosis present

## 2019-12-10 DIAGNOSIS — Z992 Dependence on renal dialysis: Secondary | ICD-10-CM

## 2019-12-10 DIAGNOSIS — M25461 Effusion, right knee: Secondary | ICD-10-CM

## 2019-12-10 DIAGNOSIS — M25561 Pain in right knee: Secondary | ICD-10-CM

## 2019-12-10 DIAGNOSIS — I82409 Acute embolism and thrombosis of unspecified deep veins of unspecified lower extremity: Secondary | ICD-10-CM

## 2019-12-10 LAB — BASIC METABOLIC PANEL
Anion gap: 13 (ref 5–15)
Anion gap: 15 (ref 5–15)
BUN: 54 mg/dL — ABNORMAL HIGH (ref 6–20)
BUN: 58 mg/dL — ABNORMAL HIGH (ref 6–20)
CO2: 26 mmol/L (ref 22–32)
CO2: 26 mmol/L (ref 22–32)
Calcium: 8.6 mg/dL — ABNORMAL LOW (ref 8.9–10.3)
Calcium: 8.5 mg/dL — ABNORMAL LOW (ref 8.9–10.3)
Chloride: 93 mmol/L — ABNORMAL LOW (ref 98–111)
Chloride: 88 mmol/L — ABNORMAL LOW (ref 98–111)
Creatinine, Ser: 8.52 mg/dL — ABNORMAL HIGH (ref 0.61–1.24)
Creatinine, Ser: 8.45 mg/dL — ABNORMAL HIGH (ref 0.61–1.24)
GFR, Estimated: 7 mL/min — ABNORMAL LOW (ref >60)
GFR, Estimated: 7 mL/min — ABNORMAL LOW (ref >60)
Glucose, Bld: 100 mg/dL — ABNORMAL HIGH (ref 70–99)
Glucose, Bld: 141 mg/dL — ABNORMAL HIGH (ref 70–99)
Potassium: 6.5 mmol/L (ref 3.5–5.1)
Potassium: 7.5 mmol/L (ref 3.5–5.1)
Sodium: 132 mmol/L — ABNORMAL LOW (ref 135–145)
Sodium: 129 mmol/L — ABNORMAL LOW (ref 135–145)

## 2019-12-10 LAB — CBC
HCT: 31.5 % — ABNORMAL LOW (ref 39.0–52.0)
Hemoglobin: 9.5 g/dL — ABNORMAL LOW (ref 13.0–17.0)
MCH: 29.3 pg (ref 26.0–34.0)
MCHC: 30.2 g/dL (ref 30.0–36.0)
MCV: 97.2 fL (ref 80.0–100.0)
Platelets: 132 10*3/uL — ABNORMAL LOW (ref 150–400)
RBC: 3.24 MIL/uL — ABNORMAL LOW (ref 4.22–5.81)
RDW: 16 % — ABNORMAL HIGH (ref 11.5–15.5)
WBC: 6.9 10*3/uL (ref 4.0–10.5)
nRBC: 0 % (ref 0.0–0.2)

## 2019-12-10 LAB — HEPATITIS B SURFACE ANTIGEN: Hepatitis B Surface Ag: NONREACTIVE

## 2019-12-10 LAB — MAGNESIUM: Magnesium: 2.2 mg/dL (ref 1.7–2.4)

## 2019-12-10 MED ORDER — HEPARIN SODIUM (PORCINE) 5000 UNIT/ML IJ SOLN
5000.0000 [IU] | Freq: Three times a day (TID) | INTRAMUSCULAR | Status: DC
  Administered 2019-12-10 – 2019-12-19 (×24): 5000 [IU] via SUBCUTANEOUS
  Filled 2019-12-10 (×27): qty 1

## 2019-12-10 MED ORDER — PROMETHAZINE HCL 25 MG/ML IJ SOLN
INTRAMUSCULAR | Status: AC
  Filled 2019-12-10: qty 1

## 2019-12-10 MED ORDER — CALCIUM GLUCONATE-NACL 1-0.675 GM/50ML-% IV SOLN
1.0000 g | Freq: Once | INTRAVENOUS | Status: AC
  Administered 2019-12-10: 1000 mg via INTRAVENOUS
  Filled 2019-12-10 (×2): qty 50

## 2019-12-10 MED ORDER — CALCIUM GLUCONATE-NACL 1-0.675 GM/50ML-% IV SOLN
1.0000 g | Freq: Once | INTRAVENOUS | Status: AC
  Administered 2019-12-10: 1000 mg via INTRAVENOUS
  Filled 2019-12-10: qty 50

## 2019-12-10 MED ORDER — HYDROMORPHONE HCL 1 MG/ML IJ SOLN
INTRAMUSCULAR | Status: AC
  Filled 2019-12-10: qty 1

## 2019-12-10 MED ORDER — PROMETHAZINE HCL 25 MG/ML IJ SOLN
25.0000 mg | Freq: Three times a day (TID) | INTRAMUSCULAR | Status: DC | PRN
  Administered 2019-12-10 – 2019-12-19 (×10): 25 mg via INTRAVENOUS
  Filled 2019-12-10 (×6): qty 1

## 2019-12-10 MED ORDER — HEPARIN SODIUM (PORCINE) 1000 UNIT/ML IJ SOLN
INTRAMUSCULAR | Status: AC
  Administered 2019-12-10: 1000 [IU]
  Filled 2019-12-10: qty 7

## 2019-12-10 MED ORDER — INSULIN REGULAR BOLUS VIA INFUSION: 10.0000 [IU] | Freq: Once | INTRAVENOUS | Status: DC

## 2019-12-10 MED ORDER — SODIUM BICARBONATE 8.4 % IV SOLN
50.0000 meq | Freq: Once | INTRAVENOUS | Status: AC
  Administered 2019-12-10: 50 meq via INTRAVENOUS
  Filled 2019-12-10: qty 50

## 2019-12-10 MED ORDER — DOXERCALCIFEROL 4 MCG/2ML IV SOLN
INTRAVENOUS | Status: AC
  Filled 2019-12-10: qty 2

## 2019-12-10 MED ORDER — INSULIN ASPART 100 UNIT/ML IV SOLN: 5.0000 [IU] | Freq: Once | INTRAVENOUS | Status: DC

## 2019-12-10 MED ORDER — ALTEPLASE 2 MG IJ SOLR
INTRAMUSCULAR | Status: AC
  Administered 2019-12-10: 2 mg
  Filled 2019-12-10: qty 4

## 2019-12-10 MED ORDER — SODIUM ZIRCONIUM CYCLOSILICATE 5 G PO PACK
5.0000 g | PACK | Freq: Once | ORAL | Status: AC
  Administered 2019-12-10: 5 g via ORAL
  Filled 2019-12-10: qty 1

## 2019-12-10 MED ORDER — ALBUTEROL SULFATE (2.5 MG/3ML) 0.083% IN NEBU: 10.0000 mg | INHALATION_SOLUTION | Freq: Once | RESPIRATORY_TRACT | Status: DC

## 2019-12-10 MED ORDER — DEXTROSE 50 % IV SOLN: 50.0000 mL | Freq: Once | INTRAVENOUS | Status: DC

## 2019-12-10 MED ORDER — DARBEPOETIN ALFA 150 MCG/0.3ML IJ SOSY
150.0000 ug | PREFILLED_SYRINGE | INTRAMUSCULAR | Status: DC
  Filled 2019-12-10: qty 0.3

## 2019-12-10 MED ORDER — VANCOMYCIN HCL IN DEXTROSE 500-5 MG/100ML-% IV SOLN
500.0000 mg | INTRAVENOUS | Status: AC
  Administered 2019-12-10: 500 mg via INTRAVENOUS

## 2019-12-10 MED ORDER — VANCOMYCIN HCL IN DEXTROSE 500-5 MG/100ML-% IV SOLN
INTRAVENOUS | Status: AC
  Filled 2019-12-10: qty 100

## 2019-12-10 MED ORDER — DEXTROSE 50 % IV SOLN: 25.0000 g | INTRAVENOUS | Status: DC

## 2019-12-10 NOTE — Progress Notes (Signed)
Lab called on critical K result. Pt's K 7.5. Notified Dr. Carolin Sicks. New orders received. Place pt on 1k for 1 hour after 0k for 45 mins, then change to 2k for remainder of tx. T.O. Dr. Renne Crigler

## 2019-12-10 NOTE — Consult Note (Addendum)
[facility] Renal Consultation Note  Indication for Consultation:  Management of ESRD/hemodialysis; anemia, hypertension/volume and secondary hyperparathyroidism  HPI: [name] is a 54 y.o. male with ESRD on  dialysis MWF, spina bifida HTN, nephrostomy due to ileal conduit, urolithiasis, seizure history, end-stage HD access femoral PermCath now admitted with Right knee spontaneous hemarthrosis versus septic arthritis status post I&D 12/09/19 evening.  He completed dialysis on schedule Monday (was not febrile )and noted worsening right knee pain with ROM/swelling and thus presented in the ER .Marland Kitchen He denies any fever chills, shortness of breath, vomiting URI symptoms, chest pain or pain at PermCath site in his right leg.  He has some chronic nausea does use as needed Phenergan  We are consulted for HD/ESRD issues.  Currently on hemodialysis and states he feels better now =status post yesterday pm I&D and on IV antibiotics.      Past Medical History:  Diagnosis Date  . Anemia   . Anxiety   . Constipation   . End stage renal failure on dialysis [facility])    [place]; MWF, on HD since 1997, exhausted upper ext access, and possibly LE access; cath dependent in R groin as of 06/2018  . GERD (gastroesophageal reflux disease)   . Grand mal seizure ([place]) X 4   last 1998 (02/29/2016)  . History of blood transfusion 2000s   "I was having blood loss; never found out from where"  . History of kidney stones   . Hypertension    hx of - has not taken bp meds in over 2 years  since dialysis  . Hypothyroidism   . Insomnia   . Migraine    "due to my BP in my late 1990s; none since" (02/29/2016)  . Nonhealing surgical wound    of the left arteriovenous graft  . Severe aortic stenosis    no problems with per pt. - nephrologist and Dr. Coletta Memos  . Spina bifida [facility])     Past Surgical History:  Procedure Laterality Date  . AMPUTATION Left 12/11/2017   Procedure: Left Great Toe  Amputation;  Surgeon: [name], MD;  Location: [place];  Service: Orthopedics;  Laterality: Left;  . APPENDECTOMY    . APPLICATION OF WOUND VAC Left 10/04/2019   Procedure: APPLICATION OF WOUND VAC;  Surgeon: Serafina Mitchell, MD;  Location: [place];  Service: Vascular;  Laterality: Left;  . ARTERIOVENOUS GRAFT PLACEMENT Left 10/16/2012   left femoral goretex graft         Dr Donnetta Hutching  . AV FISTULA PLACEMENT Left 06/27/2012   Procedure: EXPLORATORY LEFT THY-GRAFT PSEUDO-ANEURYSM;  Surgeon: Conrad [place], MD;  Location: [place];  Service: Vascular;  Laterality: Left;  Revision of left Arteriovenus gortex graft in thigh.  Bertram Savin REMOVAL Left 10/04/2019   Procedure: REMOVAL OF ARTERIOVENOUS GORETEX GRAFT ([place]);  Surgeon: Serafina Mitchell, MD;  [facility];  Service: Vascular;  Laterality: Left;  . COLONOSCOPY    . DRESSING CHANGE UNDER ANESTHESIA Left 10/07/2019   Procedure: DRESSING CHANGE UNDER ANESTHESIA;  Surgeon: Meredith Pel, MD;  Location: [place];  Service: Orthopedics;  Laterality: Left;  . FLEXIBLE SIGMOIDOSCOPY N/A 12/03/2018   Procedure: FLEXIBLE SIGMOIDOSCOPY;  Surgeon: Yetta Flock, MD;  Location: [place];  Service: Gastroenterology;  Laterality: N/A;  . I & D EXTREMITY Left 02/29/2016   Procedure: DEBRIDEMENT LEFT THIGH WOUND;  Surgeon: Waynetta Sandy, MD;  Location: [place];  Service: Vascular;  Laterality: Left;  .  I & D EXTREMITY Left 10/04/2019   Procedure: IRRIGATION AND DEBRIDEMENT LEFT THIGH;  Surgeon: Serafina Mitchell, MD;  Location: [place];  Service: Vascular;  Laterality: Left;  . I & D EXTREMITY Right 12/09/2019   Procedure: IRRIGATION AND DEBRIDEMENT EXTREMITY;  Surgeon: Altamese [place]e, MD;  Location: [place];  Service: Orthopedics;  Laterality: Right;  . ILEOSTOMY  1970s  . INCISION AND DRAINAGE Left 10/16/2012   Procedure: INCISION AND Debridement left thigh graft;  Surgeon: Rosetta Posner, MD;  Location[place];  Service: Vascular;  Laterality: Left;  .  INSERTION OF DIALYSIS CATHETER    . INSERTION OF DIALYSIS CATHETER  01/14/2016   Procedure: INSERTION OF DIALYSIS CATHETER;  Surgeon: Waynetta Sandy, MD;  Location: [place];  Service: Vascular;;  . INSERTION OF DIALYSIS CATHETER Right 08/07/2017   Procedure: INSERTION OF DIALYSIS CATHETER;  Surgeon: Waynetta Sandy, MD;  Location: [place];  Service: Vascular;  Laterality: Right;  . INSERTION OF DIALYSIS CATHETER Right 12/21/2017   Procedure: RIGHT FEMORAL DIALYSIS CATHETER EXCHANGE;  Surgeon: Marty Heck, MD;  Location: [place];  Service: Vascular;  Laterality: Right;  . INSERTION OF ILIAC STENT Left 01/14/2016   Procedure: INSERTION OF ILIAC STENT;  Surgeon: Waynetta Sandy, MD;  Location: [place];  Service: Vascular;  Laterality: Left;  . INTRAOPERATIVE ARTERIOGRAM Left 01/14/2016   Procedure: INTRA OPERATIVE ARTERIOGRAM;  Surgeon: Waynetta Sandy, MD;  Location: [place];  Service: Vascular;  Laterality: Left;  . IR NEPHROSTOMY EXCHANGE LEFT  03/21/2019  . IR NEPHROSTOMY PLACEMENT LEFT  11/28/2018  . KNEE ARTHROSCOPY Left 10/07/2019   Procedure: ARTHROSCOPY KNEE w/ STIMULATING  BEAD PLACMENT;  Surgeon: Meredith Pel, MD;  Location: [place];  Service: Orthopedics;  Laterality: Left;  . LEFT HEART CATH AND CORONARY ANGIOGRAPHY N/A 10/17/2019   Procedure: LEFT HEART CATH AND CORONARY ANGIOGRAPHY;  Surgeon: Jettie Booze, MD;  Location: [place] CV LAB;  Service: Cardiovascular;  Laterality: N/A;  . LITHOTRIPSY  x 3   "& a laser treatment" (02/29/2016)  . NEPHROLITHOTOMY Left 03/26/2019   Procedure: NEPHROLITHOTOMY PERCUTANEOUS;  Surgeon: Alexis Frock, MD;  Location: [facility];  Service: Urology;  Laterality: Left;  2 HRS  . NEPHROSTOMY Bilateral 1968  . PATELLAR TENDON REPAIR Right 1990s   "big incision"  . PERIPHERAL VASCULAR CATHETERIZATION  01/14/2016   Procedure: A/V SHUNTOGRAM;  Surgeon: Waynetta Sandy, MD;  Location: [place];  Service:  Vascular;;  . REVISION OF ARTERIOVENOUS GORETEX GRAFT Left 10/16/2012   Procedure: REVISION OF LEFT FEMORAL LOOP ARTERIOVENOUS GORETEX GRAFT;  Surgeon: Rosetta Posner, MD;  Location: [place];  Service: Vascular;  Laterality: Left;  . REVISION OF ARTERIOVENOUS GORETEX GRAFT Left 02/11/2015   Procedure: EXCISION OF SMALL SEGMENT OF EXPOSED LEFT THIGH NON FUNCTIONING  ARTERIOVENOUS GORETEX GRAFT;  Surgeon: Mal Misty, MD;  Location: [place];  Service: Vascular;  Laterality: Left;  . REVISION OF ARTERIOVENOUS GORETEX GRAFT Left 01/14/2016   Procedure: REVISION OF ARTERIOVENOUS GORETEX GRAFT;  Surgeon: Waynetta Sandy, MD;  Location: [place];  Service: Vascular;  Laterality: Left;  . REVISION OF ARTERIOVENOUS GORETEX GRAFT Left 02/29/2016   thigh/pt report  . REVISION OF ARTERIOVENOUS GORETEX GRAFT Left 02/29/2016   Procedure: POSSIBLE REVISION OF LEFT THIGH ARTERIOVENOUS GORETEX GRAFT;  Surgeon: Waynetta Sandy, MD;  Location: [place];  Service: Vascular;  Laterality: Left;  . TEE WITHOUT CARDIOVERSION N/A 12/14/2017   Procedure: TRANSESOPHAGEAL ECHOCARDIOGRAM (TEE);  Surgeon: Acie Fredrickson,  Wonda Cheng, MD;  Location: [place];  Service: Cardiovascular;  Laterality: N/A;  . THROMBECTOMY AND REVISION OF ARTERIOVENTOUS (AV) GORETEX  GRAFT Left 06/12/2018   Procedure: Removal of infected left thigh graft;  Surgeon: Rosetta Posner, MD;  Location: [place];  Service: Vascular;  Laterality: Left;  . THROMBECTOMY W/ EMBOLECTOMY Left 01/14/2016   Procedure: THROMBECTOMY ARTERIOVENOUS GORE-TEX Left thigh GRAFT;  Surgeon: Waynetta Sandy, MD;  Location: [place];  Service: Vascular;  Laterality: Left;  . TONGUE SURGERY  ~ 1990   tongue-tie release   . ULTRASOUND GUIDANCE FOR VASCULAR ACCESS Right 08/07/2017   Procedure: ULTRASOUND GUIDANCE FOR VASCULAR CANNULATION RIGHT FEMORAL VEIN AND LEFT AV FEMORAL GRAFT;  Surgeon: Waynetta Sandy, MD;  Location: [place];  Service: Vascular;  Laterality: Right;   . UPPER EXTREMITY VENOGRAPHY Bilateral 08/09/2017   Procedure: UPPER EXTREMITY VENOGRAPHY;  Surgeon: Serafina Mitchell, MD;  Location: [place] [facility];  Service: Cardiovascular;  Laterality: Bilateral;  . VENOGRAM N/A 09/20/2017   Procedure: RIGHT COMMON FEMORAL ARTERY EXPLORATION.  CANNULATION RIGHT COMMON FEMORAL VEIN. VENOGRAM CENTRAL ULTRA SOUND GUIDED RIGHT FEMORAL VEIN TIMES TWO.;  Surgeon: Waynetta Sandy, MD;  Location: [place];  Service: Vascular;  Laterality: N/A;  . VENOGRAM Right 10/04/2019   Procedure: VENOGRAM;  Surgeon: Serafina Mitchell, MD;  Location: [place];  Service: Vascular;  Laterality: Right;  . WOUND DEBRIDEMENT Left 02/29/2016   thigh      Family History  Problem Relation Age of Onset  . Diabetes Mother   . Hypertension Mother   . Heart disease Mother        before age 63  . Diabetes Father   . Heart attack Father        X's 3  . Diabetes Sister   . Bipolar disorder Sister       reports that he has never smoked. He has never used smokeless tobacco. He reports previous alcohol use. He reports previous drug use. Drug: Marijuana.   Allergies  Allergen Reactions  . Hydrocodone Other (See Comments)    Caused involuntary movement and twitching. CANNOT TAKE DUE TO MUSCLE SPASMS AND MUSCLE TREMORS  . Colace [Docusate] Diarrhea and Nausea And Vomiting  . Furadantin [Nitrofurantoin] Other (See Comments)    UNSPECIFIED REACTION   . Mandelamine [Methenamine] Other (See Comments)    UNSPECIFIED REACTION   . Noroxin [Norfloxacin] Other (See Comments)    UNSPECIFIED REACTION   . Sulfa Antibiotics Other (See Comments) and Cough    Childhood reaction - pt could not confirm that it was a cough  . Sulfur Cough    Childhood reaction - pt could not confirm that it was a cough    Prior to Admission medications   Medication Sig Start Date End Date Taking? Authorizing Provider  acetaminophen (TYLENOL) 500 MG tablet Take 1,000 mg by mouth every 6 (six) hours as  needed for mild pain or headache.   Yes [provider]  aspirin 325 MG tablet Take 325 mg by mouth daily.   Yes [provider]  calcium acetate (PHOSLO) 667 MG capsule Take 3-4 capsules (2,001-2,668 mg total) by mouth See admin instructions. Take 2708 mg by mouth 3 times daily with meals, take 2001 mg by mouth with snacks Patient taking differently: Take 2,668 mg by mouth 3 (three) times daily with meals.  01/14/18  Yes Hennie Duos, MD  clopidogrel (PLAVIX) 75 MG tablet Take 75 mg by mouth daily.   Yes  [provider]  escitalopram (LEXAPRO) 5 MG tablet Take 1 tablet (5 mg total) by mouth daily. 10/19/19 12/09/19 Yes Brimage, Vondra, DO  famotidine (PEPCID) 40 MG tablet Take 40 mg by mouth daily. 11/03/19  Yes [provider]  fluticasone (FLONASE) 50 MCG/ACT nasal spray Place 2 sprays into both nostrils as needed for allergies or rhinitis (congestion). Patient taking differently: Place 2 sprays into both nostrils daily as needed for allergies or rhinitis (congestion).  01/14/18  Yes Hennie Duos, MD  furosemide (LASIX) 80 MG tablet Take 80 mg by mouth 2 (two) times daily as needed for fluid.    Yes [provider]  levothyroxine (SYNTHROID) 125 MCG tablet Take 137 mcg by mouth daily. 11/03/19  Yes [provider]  levothyroxine (SYNTHROID) 137 MCG tablet Take 137 mcg by mouth daily before breakfast.   Yes [provider]  LORazepam (ATIVAN) 1 MG tablet Take 1 mg by mouth daily.    Yes [provider]  Nutritional Supplements (NEPRO) LIQD Take 237 mLs by mouth daily. Mixed Berry   Yes [provider]  oxyCODONE-acetaminophen (PERCOCET) 10-325 MG tablet Take 1 tablet by mouth every 6 (six) hours as needed for pain. Patient taking differently: Take 1 tablet by mouth every 6 (six) hours.  06/14/18  Yes Dagoberto Ligas, PA-C  promethazine (PHENERGAN) 25 MG tablet Take 1 tablet (25 mg total) by mouth 2 (two) times  daily as needed for nausea or vomiting. 01/14/18  Yes Hennie Duos, MD  rosuvastatin (CRESTOR) 10 MG tablet Take 1 tablet (10 mg total) by mouth daily. 10/18/19 12/09/19 Yes Brimage, Vondra, DO  sodium polystyrene (KAYEXALATE) powder Take 15 g by mouth See admin instructions. Take 15 g (mixed in water) by mouth on Saturdays and Sundays 01/14/18  Yes Alexander, Anne D, MD  traZODone (DESYREL) 100 MG tablet Take 1 tablet (100 mg total) by mouth at bedtime. 01/14/18  Yes Alexander, Anne D, MD  zolpidem (AMBIEN) 10 MG tablet Take 1 tablet (10 mg total) by mouth at bedtime. 01/14/18  Yes Alexander, Anne D, MD  cefTAZidime 2 g in sodium chloride 0.9 % 100 mL Inject 2 g into the vein every Monday, Wednesday, and Friday with hemodialysis. 10/20/19   Brimage, Vondra, DO  famotidine (PEPCID) 20 MG tablet Take 1 tablet (20 mg total) by mouth daily. Patient not taking: Reported on 12/09/2019 03/25/19   Cresenzo, Victor, MD     Anti-infectives (From admission, onward)   Start     Dose/Rate Route Frequency Ordered Stop   12/10/19 1200  vancomycin (VANCOCIN) IVPB 500 mg/100 ml premix        500 mg 100 mL/hr over 60 Minutes Intravenous Every Wed (Hemodialysis) 12/10/19 1031 12/17/19 1159   12/10/19 1042  vancomycin (VANCOCIN) 500-5 MG/100ML-% IVPB       Note to Pharmacy: Walker, Nina   : cabinet override      12/10/19 1042 12/10/19 2244   12/09/19 1930  vancomycin (VANCOREADY) IVPB 1500 mg/300 mL        1,500 mg 150 mL/hr over 120 Minutes Intravenous  Once 12/09/19 1847 12/10/19 0022   12/09/19 1849  vancomycin variable dose per unstable renal function (pharmacist dosing)         Does not apply See admin instructions 12/09/19 1849     12 /07/21 1730  piperacillin-tazobactam (ZOSYN) IVPB 2.25 g        2.25 g 100 mL/hr over 30 Minutes Intravenous Every 8 hours 12/09/19 1702  12/09/19 1545  ceFAZolin (ANCEF) IVPB 2g/100 mL premix        2 g 200 mL/hr over 30 Minutes Intravenous On call to O.R. 12/09/19 1514  12/09/19 1600   12/09/19 1525  ceFAZolin (ANCEF) 2-4 GM/100ML-% IVPB       Note to Pharmacy: [name]   : cabinet override      12/09/19 1525 12/09/19 1612   12/09/19 0930  vancomycin (VANCOCIN) IVPB 1000 mg/200 mL premix        1,000 mg 200 mL/hr over 60 Minutes Intravenous  Once 12/09/19 0930 12/09/19 1120      Results for orders placed or performed during the hospital encounter of 12/08/19 (from the past 48 hour(s))  Basic metabolic panel     Status: Abnormal   Collection Time: 12/08/19 12:52 PM  Result Value Ref Range   Sodium 135 135 - 145 mmol/L   Potassium 4.4 3.5 - 5.1 mmol/L   Chloride 94 (L) 98 - 111 mmol/L   CO2 26 22 - 32 mmol/L   Glucose, Bld 104 (H) 70 - 99 mg/dL    Comment: Glucose reference range applies only to samples taken after fasting for at least 8 hours.   BUN 23 (H) 6 - 20 mg/dL   Creatinine, Ser 5.21 (H) 0.61 - 1.24 mg/dL   Calcium 9.2 8.9 - 10.3 mg/dL   GFR, Estimated 12 (L) >60 mL/min    Comment: (NOTE) Calculated using the CKD-EPI Creatinine Equation (2021)    Anion gap 15 5 - 15    Comment: Performed at [facility] [address] [place], [place] [address]  CBC     Status: Abnormal   Collection Time: 12/08/19 12:52 PM  Result Value Ref Range   WBC 6.5 4.0 - 10.5 K/uL   RBC 3.52 (L) 4.22 - 5.81 MIL/uL   Hemoglobin 10.7 (L) 13.0 - 17.0 g/dL   HCT 33.8 (L) 39 - 52 %   MCV 96.0 80.0 - 100.0 fL   MCH 30.4 26.0 - 34.0 pg   MCHC 31.7 30.0 - 36.0 g/dL   RDW 16.4 (H) 11.5 - 15.5 %   Platelets 158 150 - 400 K/uL   nRBC 0.0 0.0 - 0.2 %    Comment: Performed at [facility], [place] [address] [place], [place] [address]  Troponin I (High Sensitivity)     Status: None   Collection Time: 12/08/19 12:52 PM  Result Value Ref Range   Troponin I (High Sensitivity) 14 <18 ng/L    Comment: (NOTE) Elevated high sensitivity troponin I (hsTnI) values and significant  changes across serial measurements may suggest ACS but many other  chronic  and acute conditions are known to elevate hsTnI results.  Refer to the "Links" section for chest pain algorithms and additional  guidance. Performed at [facility], [place] [address], [place], [place] [address]   Lactic acid, plasma     Status: None   Collection Time: 12/08/19 12:52 PM  Result Value Ref Range   Lactic Acid, Venous 1.2 0.5 - 1.9 mmol/L    Comment: Performed at [facility] [address] [place], [place] [address]  Troponin I (High Sensitivity)     Status: None   Collection Time: 12/08/19  9:40 PM  Result Value Ref Range   Troponin I (High Sensitivity) 17 <18 ng/L    Comment: (NOTE) Elevated high sensitivity troponin I (hsTnI) values and significant  changes across serial measurements may  suggest ACS but many other  chronic and acute conditions are known to elevate hsTnI results.  Refer to the "Links" section for chest pain algorithms and additional  guidance. Performed at [facility], [place] [address], [place], [place] [address]   Blood culture (routine x 2)     Status: None (Preliminary result)   Collection Time: 12/09/19  8:38 AM   Specimen: BLOOD  Result Value Ref Range   Specimen Description BLOOD RIGHT ANTECUBITAL    Special Requests      BOTTLES DRAWN AEROBIC AND ANAEROBIC Blood Culture adequate volume   Culture      NO GROWTH < 24 HOURS Performed at [facility], [place] [address], [place], [place] [address]    Report Status PENDING   Blood culture (routine x 2)     Status: None (Preliminary result)   Collection Time: 12/09/19  8:43 AM   Specimen: BLOOD  Result Value Ref Range   Specimen Description BLOOD RIGHT ANTECUBITAL    Special Requests      BOTTLES DRAWN AEROBIC AND ANAEROBIC Blood Culture adequate volume   Culture      NO GROWTH < 24 HOURS Performed at [facility], [place] [address], [place], [place] [address]    Report Status PENDING   Synovial cell count + diff, w/ crystals     Status: Abnormal   Collection  Time: 12/09/19  8:45 AM  Result Value Ref Range   Color, Synovial RED (A) YELLOW   Appearance-Synovial BLOODY (A) CLEAR   Crystals, Fluid NO CRYSTALS SEEN    WBC, Synovial 8,540 (H) 0 - 200 /cu mm    Comment: COUNT MAY BE INACCURATE DUE TO FIBRIN CLUMPS.   Neutrophil, Synovial 95 (H) 0 - 25 %   Lymphocytes-Synovial Fld 0 0 - 20 %   Monocyte-Macrophage-Synovial Fluid 5 (L) 50 - 90 %   Eosinophils-Synovial 0 0 - 1 %    Comment: Performed at [facility] [address], [place], [place] [address]  Body fluid culture     Status: None (Preliminary result)   Collection Time: 12/09/19  8:47 AM   Specimen: Body Fluid  Result Value Ref Range   Specimen Description FLUID SYNOVIAL KNEE RIGHT    Special Requests NONE    Gram Stain      ABUNDANT WBC PRESENT, PREDOMINANTLY PMN NO ORGANISMS SEEN Performed at [facility], [place] [address], [place], [place] [address]    Culture PENDING    Report Status PENDING   Resp Panel by RT-PCR (Flu A&B, Covid) Nasopharyngeal Swab     Status: None   Collection Time: 12/09/19  8:51 AM   Specimen: Nasopharyngeal Swab; Nasopharyngeal(NP) swabs in vial transport medium  Result Value Ref Range   SARS Coronavirus 2 by RT PCR NEGATIVE NEGATIVE    Comment: (NOTE) SARS-CoV-2 target nucleic acids are NOT DETECTED.  The SARS-CoV-2 RNA is generally detectable in upper respiratory specimens during the acute phase of infection. The lowest concentration of SARS-CoV-2 viral copies this assay can detect is 138 copies/mL. A negative result does not preclude SARS-Cov-2 infection and should not be used as the sole basis for treatment or other patient management decisions. A negative result may occur with  improper specimen collection/handling, submission of specimen other than nasopharyngeal swab, presence of viral mutation(s) within the areas targeted by this assay, and inadequate number of viral copies(<138 copies/mL). A negative result must be combined  with clinical observations, patient  history, and epidemiological information. The expected result is Negative.  Fact Sheet for Patients:  EntrepreneurPulse.com.au  Fact Sheet for Healthcare Providers:  IncredibleEmployment.be  This test is no t yet approved or cleared by the [place] FDA and  has been authorized for detection and/or diagnosis of SARS-CoV-2 by FDA under an Emergency Use Authorization (EUA). This EUA will remain  in effect (meaning this test can be used) for the duration of the COVID-19 declaration under Section 564(b)(1) of the Act, 21 U.S.C.section 360bbb-3(b)(1), unless the authorization is terminated  or revoked sooner.       Influenza A by PCR NEGATIVE NEGATIVE   Influenza B by PCR NEGATIVE NEGATIVE    Comment: (NOTE) The Xpert Xpress SARS-CoV-2/FLU/RSV plus assay is intended as an aid in the diagnosis of influenza from Nasopharyngeal swab specimens and should not be used as a sole basis for treatment. Nasal washings and aspirates are unacceptable for Xpert Xpress SARS-CoV-2/FLU/RSV testing.  Fact Sheet for Patients: EntrepreneurPulse.com.au  Fact Sheet for Healthcare Providers: IncredibleEmployment.be  This test is not yet approved or cleared by the [place] FDA and has been authorized for detection and/or diagnosis of SARS-CoV-2 by FDA under an Emergency Use Authorization (EUA). This EUA will remain in effect (meaning this test can be used) for the duration of the COVID-19 declaration under Section 564(b)(1) of the Act, 21 U.S.C. section 360bbb-3(b)(1), unless the authorization is terminated or revoked.  Performed at [facility], [place] [address] [place], [place] [address]   C-reactive protein     Status: Abnormal   Collection Time: 12/09/19 10:40 AM  Result Value Ref Range   CRP 21.5 (H) <1.0 mg/dL    Comment: Performed at [facility] [address], [place], [place] [address]  Sedimentation rate     Status: Abnormal   Collection Time: 12/09/19 10:40 AM  Result Value Ref Range   Sed Rate 93 (H) 0 - 16 mm/hr    Comment: Performed at [facility] [address] [place], [place] [address]  Surgical pcr screen     Status: None   Collection Time: 12/09/19  3:23 PM   Specimen: Nasal Mucosa; Nasal Swab  Result Value Ref Range   MRSA, PCR NEGATIVE NEGATIVE   Staphylococcus aureus NEGATIVE NEGATIVE    Comment: (NOTE) The Xpert SA Assay (FDA approved for NASAL specimens in patients 71 years of age and older), is one component of a comprehensive surveillance program. It is not intended to diagnose infection nor to guide or monitor treatment. Performed at [facility], [place] [address], [place], [place] [address]   Aerobic/Anaerobic Culture (surgical/deep wound)     Status: None (Preliminary result)   Collection Time: 12/09/19  4:17 PM   Specimen: PATH Cytology Misc. fluid; Body Fluid  Result Value Ref Range   Specimen Description SYNOVIAL RIGHT KNEE    Special Requests FLUID ON SWABS    Gram Stain      FEW WBC PRESENT,BOTH PMN AND MONONUCLEAR NO ORGANISMS SEEN Performed at [facility], [address] [place], [place] [address]    Culture PENDING    Report Status PENDING   HIV Antibody (routine testing w rflx)     Status: None   Collection Time: 12/09/19  8:12 PM  Result Value Ref Range   HIV Screen 4th Generation wRfx Non Reactive Non Reactive    Comment: Performed at [facility], [place] [address], [place], [place] [address]  CBC     Status: Abnormal   Collection Time: 12/10/19  3:36 AM  Result Value Ref Range   WBC 6.9 4.0 - 10.5 K/uL   RBC 3.24 (L) 4.22 - 5.81 MIL/uL   Hemoglobin 9.5 (L) 13.0 - 17.0 g/dL   HCT 31.5 (L) 39 - 52 %   MCV 97.2 80.0 - 100.0 fL   MCH 29.3 26.0 - 34.0 pg   MCHC 30.2 30.0 - 36.0 g/dL   RDW 16.0 (H) 11.5 - 15.5 %   Platelets 132 (L) 150 - 400 K/uL   nRBC 0.0 0.0 - 0.2 %     Comment: Performed at [facility] [address], [place], [place] [address]  Basic metabolic panel     Status: Abnormal   Collection Time: 12/10/19  3:36 AM  Result Value Ref Range   Sodium 132 (L) 135 - 145 mmol/L   Potassium 6.5 (HH) 3.5 - 5.1 mmol/L    Comment: CRITICAL RESULT CALLED TO, READ BACK BY AND VERIFIED WITH: SNEINDERMAN R,RN 12/10/19 0436 WAYK    Chloride 93 (L) 98 - 111 mmol/L   CO2 26 22 - 32 mmol/L   Glucose, Bld 100 (H) 70 - 99 mg/dL    Comment: Glucose reference range applies only to samples taken after fasting for at least 8 hours.   BUN 54 (H) 6 - 20 mg/dL   Creatinine, Ser 8.52 (H) 0.61 - 1.24 mg/dL    Comment: DELTA CHECK NOTED   Calcium 8.6 (L) 8.9 - 10.3 mg/dL   GFR, Estimated 7 (L) >60 mL/min    Comment: (NOTE) Calculated using the CKD-EPI Creatinine Equation (2021)    Anion gap 13 5 - 15    Comment: Performed at [facility] [address], [place], [place] [place] [id number]     ROS: See HPI   Physical Exam: Vitals:   12/10/19 0200 12/10/19 0500  BP: 117/62 110/63  Pulse: 72 70  Resp: 17 16  Temp: 98.4 F (36.9 C) 98.5 F (36.9 C)  SpO2: 98% 99%     General: Obese alert chronically ill male NAD on hemodialysis HEENT: , nonicteric, EOMI, PERRLA MMM moist Neck: No JVD Heart: RRR, no MRG Lungs: CTA anteriorly, nonlabored breathing Abdomen: Bowel sounds normoactive, obese nontender nondistended no ascites Extremities: Right knee surgical dressing clear dry minimal swelling, pedal edema right /left trace Skin: Warm and dry no acute rash noted Neuro: Alert, O x3, appropriate, no acute focal deficits appreciated Dialysis Access: Left thigh PermCath patent on hemodialysis blood flow rate 300  Dialysis Orders: Center: [facility],  MWF, EDW 60.5, 2K, 2.25 calcium bath, left thigh PermCath, Sensipar 30 mg q. hemodialysis, Hectorol 4 mics q. dialysis, heparin 3700 units Mircera 150 MCG given last on 12/01/2019, Venofer 50 q. hemodialysis  weekly   Assessment/Plan 1. Right knee spontaneous hemarthrosis versus septic arthritis status post I&D 12/09/2019 orthopedics following, IV antibiotics 2. ESRD -HD on schedule MWF using left femoral PermCath which is close to end-stage Access, may need Cathflo again 3. Hyperkalemia-K7.5 this a.m. predialysis use low potassium bath follow-up trend noted patient does take as outpatient Kayexalate on Saturday and Sundays, in hospital will use Lokelma if needed 4. Hypertension/volume  -attempting to liter UF BP under control 5. Anemia  -Hgb 9.5, will continue ESA on schedule hold his weekly iron secondary to infected knee 6. Metabolic bone disease - continue binders and vitamin D on dialysis 7. Nutrition -renal diet  Ernest Haber, PA-C [facility] [phone] 12/10/2019, 8:09 AM   Nephrology attending:  Seen and examined at HD unit. Chart reviewed. I agree with the assessment and plan as outlined above.  Septic arthritis of right knee s/p I&D, on vanco and zosyn. Ortho following. High K therefore dialyzed with low potassium bath, repeat lab in am. Continue MWF schedule.   Katheran James, MD [facility].

## 2019-12-10 NOTE — Evaluation (Signed)
Physical Therapy Evaluation Patient Details Name: [name] MRN: [id number] DOB: [birth year] Today's Date: 12/10/2019   History of Present Illness  54 y/o gentleman with history of ESRD on HD, HTN, nonhealing surgical wounds on LLE from prior AV grafts, recent history of septic arthritis in left knee, severe AS, CAD, spina bifida with  Myelomeningocele, L great toe amputation, nephrostomy, anxiety who presents with progressive right knee pain that began 4 days ago. s/p I&D RLE due to spontaneous hemarthrosis vs septic arthosis.  Clinical Impression  Pt presents with LE weakness, R knee post-operative pain, increased time and effort to mobilize, impaired gait, and decreased activity tolerance vs baseline. Pt to benefit from acute PT to address deficits. Pt ambulated short room distance with increased time and cuing for form and safety throughout. Pt lives at home with step-sister and has been doing HHPT, PT recommending continuing this. PT to progress mobility as tolerated, and will continue to follow acutely.      Follow Up Recommendations Home health PT;Supervision for mobility/OOB    Equipment Recommendations  None recommended by PT    Recommendations for Other Services       Precautions / Restrictions Precautions Precautions: Fall Restrictions Weight Bearing Restrictions: No RLE Weight Bearing: Weight bearing as tolerated LLE Weight Bearing: Weight bearing as tolerated      Mobility  Bed Mobility Overal bed mobility: Needs Assistance Bed Mobility: Supine to Sit;Sit to Supine     Supine to sit: Min guard;HOB elevated Sit to supine: Min guard;HOB elevated   General bed mobility comments: for safety, increased time to lift LEs into bed upon return to supine.    Transfers Overall transfer level: Needs assistance Equipment used: Rolling walker (2 wheeled) Transfers: Sit to/from Stand Sit to Stand: Min assist;From elevated surface         General transfer  comment: Min assist for power up from very elevated bed height, verbal cuing for hand placement when rising.  Ambulation/Gait Ambulation/Gait assistance: Min guard Gait Distance (Feet): 15 Feet Assistive device: Rolling walker (2 wheeled) Gait Pattern/deviations: Step-through pattern;Decreased stride length;Trunk flexed;Wide base of support Gait velocity: decr   General Gait Details: Min guard for safety, pt ambulating with forward flexed posture and wide BOS/outtoeing  Stairs            Wheelchair Mobility    Modified Rankin (Stroke Patients Only)       Balance Overall balance assessment: Needs assistance Sitting-balance support: No upper extremity supported;Feet supported Sitting balance-Leahy Scale: Good     Standing balance support: Bilateral upper extremity supported;During functional activity Standing balance-Leahy Scale: Poor Standing balance comment: reliant on external assist                             Pertinent Vitals/Pain Pain Assessment: 0-10 Pain Score: 7  Pain Location: R knee Pain Descriptors / Indicators: Sore Pain Intervention(s): Limited activity within patient's tolerance;Monitored during session;Repositioned    Home Living Family/patient expects to be discharged to:: Private residence Living Arrangements: Other relatives Building services engineer) Available Help at Discharge: Family;Available PRN/intermittently Type of Home: House Home Access: Level entry;Stairs to enter   Entrance Stairs-Number of Steps: 1 Home Layout: One level Home Equipment: Wheelchair - manual;Wheelchair - power;Hospital bed;Walker - 2 wheels      Prior Function Level of Independence: Independent with assistive device(s)         Comments: pt reports using RW for mobility, occasionally w/c if he is feeling tired.  Has been getting HHPT, schedules it on days opposite dialysis due to history of dizziness and chest pain post-HD.     Hand Dominance   Dominant Hand:  Right    Extremity/Trunk Assessment   Upper Extremity Assessment Upper Extremity Assessment: Defer to OT evaluation    Lower Extremity Assessment Lower Extremity Assessment: Generalized weakness;RLE deficits/detail RLE Deficits / Details: able to perform LAQ x10 with no quad lag    Cervical / Trunk Assessment Cervical / Trunk Assessment: Normal  Communication   Communication: No difficulties  Cognition Arousal/Alertness: Awake/alert Behavior During Therapy: WFL for tasks assessed/performed Overall Cognitive Status: Within Functional Limits for tasks assessed                                        General Comments      Exercises General Exercises - Lower Extremity Long Arc Quad: AROM;Right;10 reps;Seated Heel Slides: AROM;Right;5 reps;Supine (limited ROM, pt anxiety about HD cath)   Assessment/Plan    PT Assessment Patient needs continued PT services  PT Problem List Decreased strength;Decreased mobility;Decreased safety awareness;Decreased activity tolerance;Decreased balance;Decreased range of motion;Decreased knowledge of use of DME;Pain;Decreased skin integrity       PT Treatment Interventions DME instruction;Therapeutic activities;Gait training;Therapeutic exercise;Patient/family education;Balance training;Stair training;Functional mobility training;Neuromuscular re-education    PT Goals (Current goals can be found in the Care Plan section)  Acute Rehab PT Goals Patient Stated Goal: go home PT Goal Formulation: With patient Time For Goal Achievement: 12/24/19 Potential to Achieve Goals: Good    Frequency Min 5X/week   Barriers to discharge        Co-evaluation               AM-PAC PT "6 Clicks" Mobility  Outcome Measure Help needed turning from your back to your side while in a flat bed without using bedrails?: A Little Help needed moving from lying on your back to sitting on the side of a flat bed without using bedrails?: A  Little Help needed moving to and from a bed to a chair (including a wheelchair)?: A Little Help needed standing up from a chair using your arms (e.g., wheelchair or bedside chair)?: A Little Help needed to walk in hospital room?: A Little Help needed climbing 3-5 steps with a railing? : A Little 6 Click Score: 18    End of Session Equipment Utilized During Treatment: Gait belt Activity Tolerance: Patient tolerated treatment well;Patient limited by pain Patient left: in bed;with call bell/phone within reach;with bed alarm set Nurse Communication: Mobility status;Patient requests pain meds PT Visit Diagnosis: Other abnormalities of gait and mobility (R26.89);Muscle weakness (generalized) (M62.81)    Time: 7001-7494 PT Time Calculation (min) (ACUTE ONLY): 26 min   Charges:   PT Evaluation $PT Eval Low Complexity: 1 Low PT Treatments $Gait Training: 8-22 mins        Akilah Cureton E, PT Acute Rehabilitation Services Pager [phone]  Office [phone]    Roxine Caddy D Elonda Husky 12/10/2019, 4:39 PM

## 2019-12-10 NOTE — Procedures (Signed)
Patient was seen on dialysis and the procedure was supervised.  BFR 400  Via groin TDC BP is  104/42. Potassium high therefore dialyzing with low potassium bath.  Discussed with the nurse. Patient appears to be tolerating treatment well  [name] Tanna Furry 12/10/2019

## 2019-12-10 NOTE — Progress Notes (Addendum)
Paged by Jodi Marble for K 6.5. Patient is ESRD on HD. Ordered STAT EKG, 1g IV calcium gluconate, 1 amp of bicarb, and lokelma 5g. Repeat BMP in 1 hour. Spoke with nephrology, no need for temporary measures at this time. They will take patient for dialysis today.

## 2019-12-10 NOTE — Progress Notes (Addendum)
Orthopaedic Trauma Service Progress Note  Patient ID: [name] MRN: [id number] DOB/AGE: [birth year] 54 y.o.  Subjective:  Doing ok  Seen on dialysis unit  R knee sore but does feel a bit better than preop  Intra-op cultures: NGTD Gram stain : no organisms   ROS As above  Objective:   VITALS:   Vitals:   12/10/19 0800 12/10/19 0830 12/10/19 0900 12/10/19 0930  BP: (!) 108/53 (!) 109/55 (!) 104/42 (!) 102/42  Pulse: 66 67 60 61  Resp:      Temp:      TempSrc:      SpO2:      Weight:      Height:        Estimated body mass index is 36.68 kg/m as calculated from the following:   Height as of this encounter: 5\' 11"  (1.803 m).   Weight as of this encounter: 119.3 kg.   Intake/Output      12/07 0701 - 12/08 0700 12/08 0701 - 12/09 0700   I.V. (mL/kg) 300 (2.6)    IV Piggyback 200    Total Intake(mL/kg) 500 (4.3)    Blood 20    Total Output 20    Net +480           LABS  Results for orders placed or performed during the hospital encounter of 12/08/19 (from the past 24 hour(s))  C-reactive protein     Status: Abnormal   Collection Time: 12/09/19 10:40 AM  Result Value Ref Range   CRP 21.5 (H) <1.0 mg/dL  Sedimentation rate     Status: Abnormal   Collection Time: 12/09/19 10:40 AM  Result Value Ref Range   Sed Rate 93 (H) 0 - 16 mm/hr  Surgical pcr screen     Status: None   Collection Time: 12/09/19  3:23 PM   Specimen: Nasal Mucosa; Nasal Swab  Result Value Ref Range   MRSA, PCR NEGATIVE NEGATIVE   Staphylococcus aureus NEGATIVE NEGATIVE  Aerobic/Anaerobic Culture (surgical/deep wound)     Status: None (Preliminary result)   Collection Time: 12/09/19  4:17 PM   Specimen: PATH Cytology Misc. fluid; Body Fluid  Result Value Ref Range   Specimen Description SYNOVIAL RIGHT KNEE    Special Requests FLUID ON SWABS    Gram Stain      FEW WBC PRESENT,BOTH PMN AND MONONUCLEAR NO  ORGANISMS SEEN    Culture      NO GROWTH < 24 HOURS Performed at [facility], [place] [address], [place], [place] [address]    Report Status PENDING   HIV Antibody (routine testing w rflx)     Status: None   Collection Time: 12/09/19  8:12 PM  Result Value Ref Range   HIV Screen 4th Generation wRfx Non Reactive Non Reactive  CBC     Status: Abnormal   Collection Time: 12/10/19  3:36 AM  Result Value Ref Range   WBC 6.9 4.0 - 10.5 K/uL   RBC 3.24 (L) 4.22 - 5.81 MIL/uL   Hemoglobin 9.5 (L) 13.0 - 17.0 g/dL   HCT 31.5 (L) 39 - 52 %   MCV 97.2 80.0 - 100.0 fL   MCH 29.3 26.0 - 34.0 pg   MCHC 30.2 30.0 - 36.0 g/dL   RDW 16.0 (H) 11.5 - 15.5 %  Platelets 132 (L) 150 - 400 K/uL   nRBC 0.0 0.0 - 0.2 %  Basic metabolic panel     Status: Abnormal   Collection Time: 12/10/19  3:36 AM  Result Value Ref Range   Sodium 132 (L) 135 - 145 mmol/L   Potassium 6.5 (HH) 3.5 - 5.1 mmol/L   Chloride 93 (L) 98 - 111 mmol/L   CO2 26 22 - 32 mmol/L   Glucose, Bld 100 (H) 70 - 99 mg/dL   BUN 54 (H) 6 - 20 mg/dL   Creatinine, Ser 8.52 (H) 0.61 - 1.24 mg/dL   Calcium 8.6 (L) 8.9 - 10.3 mg/dL   GFR, Estimated 7 (L) >60 mL/min   Anion gap 13 5 - 15  Basic metabolic panel     Status: Abnormal   Collection Time: 12/10/19  6:30 AM  Result Value Ref Range   Sodium 129 (L) 135 - 145 mmol/L   Potassium 7.5 (HH) 3.5 - 5.1 mmol/L   Chloride 88 (L) 98 - 111 mmol/L   CO2 26 22 - 32 mmol/L   Glucose, Bld 141 (H) 70 - 99 mg/dL   BUN 58 (H) 6 - 20 mg/dL   Creatinine, Ser 8.45 (H) 0.61 - 1.24 mg/dL   Calcium 8.5 (L) 8.9 - 10.3 mg/dL   GFR, Estimated 7 (L) >60 mL/min   Anion gap 15 5 - 15  Magnesium     Status: None   Collection Time: 12/10/19  6:30 AM  Result Value Ref Range   Magnesium 2.2 1.7 - 2.4 mg/dL     PHYSICAL EXAM:   Gen: in bed, sleeping, easily arousable Ext:       Right Lower Extremity   Dressings stable to R knee    Scant strikethrough  Effusion still present but appears  smaller than yesterday   No changes in motor or sensory functions   No pitting edema    Assessment/Plan: 1 Day Post-Op   Active Problems:   Septic arthritis (HCC)   Anti-infectives (From admission, onward)   Start     Dose/Rate Route Frequency Ordered Stop   12/09/19 1930  vancomycin (VANCOREADY) IVPB 1500 mg/300 mL        1,500 mg 150 mL/hr over 120 Minutes Intravenous  Once 12/09/19 1847 12/10/19 0022   12/09/19 1849  vancomycin variable dose per unstable renal function (pharmacist dosing)         Does not apply See admin instructions 12/09/19 1849     12/09/19 1730  piperacillin-tazobactam (ZOSYN) IVPB 2.25 g        2.25 g 100 mL/hr over 30 Minutes Intravenous Every 8 hours 12/09/19 1702     12/09/19 1545  ceFAZolin (ANCEF) IVPB 2g/100 mL premix        2 g 200 mL/hr over 30 Minutes Intravenous On call to O.R. 12/09/19 1514 12/09/19 1600   12/09/19 1525  ceFAZolin (ANCEF) 2-4 GM/100ML-% IVPB       Note to Pharmacy: [name]   : cabinet override      12/09/19 1525 12/09/19 1612   12/09/19 0930  vancomycin (VANCOCIN) IVPB 1000 mg/200 mL premix        1,000 mg 200 mL/hr over 60 Minutes Intravenous  Once 12/09/19 0930 12/09/19 1120    .  POD/HD#: 1  54 y/o male with complex medical history with acute R knee pain, L groin wound infection and L knee septic arthritis 2 months ago    - R knee spontaneous hemarthrosis  vs septic arthritis s/p I&D  Follow cultures   Intra-op appears more hemarthrosis than septic joint but overall clinical picture is complex    Fluid analysis not necessarily consistent with septic joint but recent infections does cause concern   Do not anticipate return to OR  WBAT R leg with assistance  ROM as tolerated R knee  PT/OT evals  Ice and elevate   TED hose ok if pt desires    Dressing changes starting tomorrow to R knee    Dc sutures in 10 days    - Dispo:  Per other services  Ortho issues stable  Follow up in 10-14 days for suture  removal      Jari Pigg, PA-C [phone] (C) 12/10/2019, 10:16 AM  [facility] [place] [address] [phone] Jenetta [phone] (F)    After 5pm and on the weekends please log on to [place], go to orthopaedics and the look under the [facility] Call for the provider(s) on call. You can also call our office at [phone] and then follow the prompts to be connected to the call team.

## 2019-12-10 NOTE — Plan of Care (Signed)
°  Problem: Health Behavior/Discharge Planning: °Goal: Ability to manage health-related needs will improve °Outcome: Progressing °  °Problem: Clinical Measurements: °Goal: Ability to maintain clinical measurements within normal limits will improve °Outcome: Progressing °  °Problem: Nutrition: °Goal: Adequate nutrition will be maintained °Outcome: Progressing °  °Problem: Pain Managment: °Goal: General experience of comfort will improve °Outcome: Progressing °  °Problem: Safety: °Goal: Ability to remain free from injury will improve °Outcome: Progressing °  °Problem: Skin Integrity: °Goal: Risk for impaired skin integrity will decrease °Outcome: Progressing °  °

## 2019-12-10 NOTE — Progress Notes (Signed)
Pharmacy Antibiotic Note  [name] is a 54 y.o. male admitted on 12/08/2019 with R knee pain, suspected septic arthritis; pt is S/P I&D this afternoon.  Pharmacy has been consulted for Zosyn and vancomycin dosing.  The patient is ESRD-MWF and currently receiving HD this AM but tolerating a reduced BFR. Will reduce the maintenance dose of Vancomycin today and increase back to normal dosing with HD on Fri, 12/10. Will continue to monitor tolerance and cultures for additional adjustment needed.  Plan - Vancomycin 500 mg IV x 1 dose with HD on Wed, 12/8 - Resume Vanc 1g/HD-MWF starting on Fri, 12/10 - Continue Zosyn 2.25g IV every 8 hours - Will continue to follow HD schedule/duration, culture results, LOT, and antibiotic de-escalation plans   Height: 5\' 11"  (180.3 cm) Weight: 119.3 kg (263 lb 0.1 oz) IBW/kg (Calculated) : 75.3  Temp (24hrs), Avg:98.6 F (37 C), Min:98 F (36.7 C), Max:100.1 F (37.8 C)  Recent Labs  Lab 12/08/19 1252 12/10/19 0336 12/10/19 0630  WBC 6.5 6.9  --   CREATININE 5.21* 8.52* 8.45*  LATICACIDVEN 1.2  --   --     Estimated Creatinine Clearance: 13.1 mL/min (A) (by C-G formula based on SCr of 8.45 mg/dL (H)).    Allergies  Allergen Reactions  . Hydrocodone Other (See Comments)    Caused involuntary movement and twitching. CANNOT TAKE DUE TO MUSCLE SPASMS AND MUSCLE TREMORS  . Colace [Docusate] Diarrhea and Nausea And Vomiting  . Furadantin [Nitrofurantoin] Other (See Comments)    UNSPECIFIED REACTION   . Mandelamine [Methenamine] Other (See Comments)    UNSPECIFIED REACTION   . Noroxin [Norfloxacin] Other (See Comments)    UNSPECIFIED REACTION   . Sulfa Antibiotics Other (See Comments) and Cough    Childhood reaction - pt could not confirm that it was a cough  . Sulfur Cough    Childhood reaction - pt could not confirm that it was a cough    Antimicrobials this admission: 12/7 cefazolin 2 gm IV X 1 12/17 vancomycin >> 12/7 Zosyn  >>  Microbiology results: 12/7 BCx X 2:  pending 12/7 R knee synovial fluid: abundant WBCs (PMN), NOS; cx pending 12/7 Surgical cx: pending 12/7 MRSA PCR: negative 12/7 COVID, flu A, flu B: negative  Thank you for allowing pharmacy to be a part of this patient's care.  Alycia Rossetti, PharmD, BCPS Clinical Pharmacist Clinical phone for 12/10/2019: [phone] 12/10/2019 10:36 AM   **Pharmacist phone directory can now be found on [email or web address] ([email or web address]).  Listed under [facility].

## 2019-12-10 NOTE — Progress Notes (Addendum)
HD#1 Subjective:  Overnight Events: Elevated K to 6.5 given lokelma, calcium gluconate, and bicarb no acute EKG changes  Patient this morning evaluated at bedside while at dialysis. States that right knee intermittently with pain, some decrease in swelling. Continues to have some chills but no fever. State pain started 4 days ago while he was doing knee exercises, noted popping sound from knee but without much pain, however noted to have swelling above the knee the follow day. Swelling and pain worsened during dialysis on Monday resulting in him going to ED on Tuesday where he spiked a fever. Denies fevers prior to this.   Objective:  Vital signs in last 24 hours: Vitals:   12/09/19 2009 12/09/19 2100 12/09/19 2200 12/10/19 0200  BP: 122/63 (!) 97/57 (!) 102/55 117/62  Pulse: 78 61 72 72  Resp: 18  18 17   Temp: 98 F (36.7 C) 98.6 F (37 C) 98.5 F (36.9 C) 98.4 F (36.9 C)  TempSrc:  Oral Oral Oral  SpO2: 98% 98% 98% 98%  Weight:      Height:       Supplemental O2: Room Air SpO2: 98 %   Physical Exam:  Physical Exam Constitutional:      Appearance: Normal appearance.  Cardiovascular:     Rate and Rhythm: Normal rate and regular rhythm.     Heart sounds: Murmur (systolic ejection murmur) heard.   Abdominal:     General: Abdomen is flat.     Palpations: Abdomen is soft.  Musculoskeletal:     Comments: Right knee with TTP of medial and lateral to patella, effusion present  Skin:    General: Skin is warm and dry.     Capillary Refill: Capillary refill takes less than 2 seconds.  Neurological:     General: No focal deficit present.     Mental Status: He is alert. Mental status is at baseline.     Filed Weights   12/09/19 0753  Weight: 116.5 kg     Intake/Output Summary (Last 24 hours) at 12/10/2019 0547 Last data filed at 12/09/2019 1647 Gross per 24 hour  Intake 500 ml  Output 20 ml  Net 480 ml   Net IO Since Admission: 480 mL [12/10/19  0547]  Pertinent Labs: CBC Latest Ref Rng & Units 12/10/2019 12/08/2019 10/18/2019  WBC 4.0 - 10.5 K/uL 6.9 6.5 5.3  Hemoglobin 13.0 - 17.0 g/dL 9.5(L) 10.7(L) 8.5(L)  Hematocrit 39 - 52 % 31.5(L) 33.8(L) 29.5(L)  Platelets 150 - 400 K/uL 132(L) 158 153    CMP Latest Ref Rng & Units 12/10/2019 12/08/2019 10/18/2019  Glucose 70 - 99 mg/dL 100(H) 104(H) 102(H)  BUN 6 - 20 mg/dL 54(H) 23(H) 45(H)  Creatinine 0.61 - 1.24 mg/dL 8.52(H) 5.21(H) 6.88(H)  Sodium 135 - 145 mmol/L 132(L) 135 133(L)  Potassium 3.5 - 5.1 mmol/L 6.5(HH) 4.4 5.4(H)  Chloride 98 - 111 mmol/L 93(L) 94(L) 97(L)  CO2 22 - 32 mmol/L 26 26 24   Calcium 8.9 - 10.3 mg/dL 8.6(L) 9.2 9.1  Total Protein 6.5 - 8.1 g/dL - - -  Total Bilirubin 0.3 - 1.2 mg/dL - - -  Alkaline Phos 38 - 126 U/L - - -  AST 15 - 41 U/L - - -  ALT 0 - 44 U/L - - -    Ref Range & Units 1 d ago  Color, Synovial YELLOW REDAbnormal   Appearance-Synovial CLEAR BLOODYAbnormal   Crystals, Fluid  NO CRYSTALS SEEN   WBC, Synovial 0 -  200 /cu mm 8,540High   Comment: COUNT MAY BE INACCURATE DUE TO FIBRIN CLUMPS.  Neutrophil, Synovial 0 - 25 % 95High   Lymphocytes-Synovial Fld 0 - 20 % 0   Monocyte-Macrophage-Synovial Fluid 50 - 90 % 5Low   Eosinophils-Synovial 0 - 1 % 0     Imaging: DG Chest 2 View  Result Date: 12/09/2019 CLINICAL DATA:  Fever, chest pain EXAM: CHEST - 2 VIEW COMPARISON:  10/14/2019 FINDINGS: Low lung volumes. Mild atelectasis/scarring at the left lung base. No pleural effusion or pneumothorax. Cardiomediastinal contours are within normal limits. No acute osseous abnormality. IMPRESSION: Mild atelectasis/scarring at the left lung base. Electronically Signed   By: [name] M.D.   On: 12/09/2019 08:06   DG Knee Complete 4 Views Right  Result Date: 12/09/2019 CLINICAL DATA:  Right knee pain.  History of remote patellar injury. EXAM: RIGHT KNEE - COMPLETE 4+ VIEW COMPARISON:  None. FINDINGS: Remote transverse patellar  fracture with prominent inferior spurring. There is sclerosis along the readily visualized fracture plane. Moderate knee joint effusion. Generalized osteopenia. No acute fracture. IMPRESSION: 1. Remote patellar fracture with malunion. 2. Moderate joint effusion. Electronically Signed   By: [name] M.D.   On: 12/09/2019 09:26    Assessment/Plan:   Active Problems:   Septic arthritis [name])   Patient Summary: [name] is a 54 y/o gentleman with history of ESRD on HD, HTN, nonhealing surgical wounds from prior AV grafts, recent history of septic arthritis in left knee, severe AS, CAD, spina bifida with  Myelomeningocele who presents with progressive right knee pain that began 4 days ago. He was febrile without leukocytosis. Ortho was consulted and took rapidly to the OR for washout due to concern for septic arthritis.   Right knee effusion Afebrile overnight, does not appear septic. Joint fluid with 8,540 WBC with increase PMN, no crystals, gram negative, does not appear to be septic joint however continue antibiotics given history of recent infection -appreciate ortho consult. S/p washout and surgical cultures were obtained -continue broad spectrum antibiotics until cultures result -joint fluid cx with WBC and PMN no growth < 24 hrs -blood cx no growth today, continue to follow -dilaudid prn pain, phenergan prn nausea  ESRD on HD -appreciate nephrology consult for inpatient HD management. K of 6.5, Cr of 8.  -dialysis today  Hyperkalemia Potassium up to 6.5 this morning sp calcium gluconate x2, lokelma. No significant EKG changes. No insulin per nephrology.  - dialysis today  Chronic anxiety -continue hom lexapro and ativan   CAD, PAD -continue home plavix, aspirin and statin   DVT ppx: SCDs, resume anticoagulation 24 hrs post op Diet: Renal  CODE: FULL   Dispo: Anticipated discharge to Home in 1 days.   Darrell Beard, MD 12/10/2019, 5:47 AM Pager:  [phone]  Please contact the on call pager after 5 pm and on weekends at [phone].

## 2019-12-10 NOTE — Progress Notes (Signed)
Made NT aware that STAT EKG is ordered by MD

## 2019-12-11 DIAGNOSIS — M25061 Hemarthrosis, right knee: Secondary | ICD-10-CM

## 2019-12-11 LAB — RENAL FUNCTION PANEL
Albumin: 2.7 g/dL — ABNORMAL LOW (ref 3.5–5.0)
Anion gap: 14 (ref 5–15)
BUN: 56 mg/dL — ABNORMAL HIGH (ref 6–20)
CO2: 28 mmol/L (ref 22–32)
Calcium: 8.9 mg/dL (ref 8.9–10.3)
Chloride: 95 mmol/L — ABNORMAL LOW (ref 98–111)
Creatinine, Ser: 7.47 mg/dL — ABNORMAL HIGH (ref 0.61–1.24)
GFR, Estimated: 8 mL/min — ABNORMAL LOW (ref >60)
Glucose, Bld: 102 mg/dL — ABNORMAL HIGH (ref 70–99)
Phosphorus: 3.8 mg/dL (ref 2.5–4.6)
Potassium: 4.6 mmol/L (ref 3.5–5.1)
Sodium: 137 mmol/L (ref 135–145)

## 2019-12-11 LAB — CBC
HCT: 29.6 % — ABNORMAL LOW (ref 39.0–52.0)
HCT: 31.1 % — ABNORMAL LOW (ref 39.0–52.0)
Hemoglobin: 9 g/dL — ABNORMAL LOW (ref 13.0–17.0)
Hemoglobin: 9.3 g/dL — ABNORMAL LOW (ref 13.0–17.0)
MCH: 29.3 pg (ref 26.0–34.0)
MCH: 28.8 pg (ref 26.0–34.0)
MCHC: 30.4 g/dL (ref 30.0–36.0)
MCHC: 29.9 g/dL — ABNORMAL LOW (ref 30.0–36.0)
MCV: 96.4 fL (ref 80.0–100.0)
MCV: 96.3 fL (ref 80.0–100.0)
Platelets: 134 10*3/uL — ABNORMAL LOW (ref 150–400)
Platelets: 168 10*3/uL (ref 150–400)
RBC: 3.07 MIL/uL — ABNORMAL LOW (ref 4.22–5.81)
RBC: 3.23 MIL/uL — ABNORMAL LOW (ref 4.22–5.81)
RDW: 16 % — ABNORMAL HIGH (ref 11.5–15.5)
RDW: 16.2 % — ABNORMAL HIGH (ref 11.5–15.5)
WBC: 6.4 10*3/uL (ref 4.0–10.5)
WBC: 7.5 10*3/uL (ref 4.0–10.5)
nRBC: 0 % (ref 0.0–0.2)
nRBC: 0 % (ref 0.0–0.2)

## 2019-12-11 LAB — PROTIME-INR
INR: 1.1 (ref 0.8–1.2)
Prothrombin Time: 14 seconds (ref 11.4–15.2)

## 2019-12-11 LAB — APTT: aPTT: 36 seconds (ref 24–36)

## 2019-12-11 MED ORDER — VANCOMYCIN HCL IN DEXTROSE 1-5 GM/200ML-% IV SOLN: 1000.0000 mg | INTRAVENOUS | Status: DC

## 2019-12-11 MED ORDER — CHLORHEXIDINE GLUCONATE CLOTH 2 % EX PADS
6.0000 | MEDICATED_PAD | Freq: Every day | CUTANEOUS | Status: DC
  Administered 2019-12-11 – 2019-12-13 (×3): 6 via TOPICAL

## 2019-12-11 MED ORDER — HYDROMORPHONE HCL 1 MG/ML IJ SOLN
1.0000 mg | INTRAMUSCULAR | Status: DC | PRN
  Administered 2019-12-11 – 2019-12-12 (×6): 1 mg via INTRAVENOUS
  Filled 2019-12-11 (×6): qty 1

## 2019-12-11 NOTE — Progress Notes (Signed)
Subjective: Reports increasing knee swelling after getting up and going to bathroom, said tolerated dialysis yesterday.  Objective Vital signs in last 24 hours: Vitals:   12/10/19 1700 12/10/19 2015 12/11/19 0354 12/11/19 0721  BP: (!) 106/59 (!) 101/52 (!) 92/52 101/61  Pulse: 72 71 63 64  Resp: 17 17 17 14   Temp: 99.9 F (37.7 C) 99.2 F (37.3 C) 98.7 F (37.1 C) 98.4 F (36.9 C)  TempSrc: Oral Oral Oral Oral  SpO2: 96% 100% 97% 96%  Weight:      Height:       Weight change: 2.8 kg  Physical Exam: General: alert ,NAD  Heart: RRR, 2/6 SEM , no RG Lungs: CTA anteriorly, nonlabored breathing Abdomen: Bowel sounds normoactive, obese nontender nondistended no ascites Extremities: Right knee  minimal swelling, pedal  /left trace pedal edema Dialysis Access:  Right thigh PermCath dressing clean dry    OP Dialysis Orders: Center: [facility],  MWF, EDW 116.5 kg, 2K, 2.25 calcium bath, right femoral PermCath, Sensipar 30 mg q. hemodialysis, Hectorol 4 mics q. dialysis, heparin 3700 units Mircera 150 MCG given last on 12/01/2019, Venofer 50 q. hemodialysis weekly   Problem/Plan: 1. Right knee spontaneous hemarthrosis versus septic arthritis status post I&D 12/09/2019 orthopedics following, IV antibiotics, culture so far showing no growth, plan per orthopedics 2. ESRD -HD on schedule MWF using right femoral PermCath which is close to end-stage Access, 3. Hyperkalemia-K7.5 . Predialysis yesterday down to 4.6 today  follow-up trend noted patient does take as outpatient Kayexalate on Saturday and Sundays, in hospital will use Lokelma if needed 4. Hypertension/volume  -yesterday tolerated 2.5 L UF, he is close to his dry weight.  BP under control 5. Anemia  -Hgb 9.5 > 9.0, will continue ESA on schedule , next due on 13th Monday hold his weekly iron secondary to infected knee 6. Metabolic bone disease - continue binders and vitamin D on dialysis 7. Nutrition -renal diet   Ernest Haber,  PA-C [facility] Beeper [phone] 12/11/2019,10:36 AM  LOS: 2 days   Labs: Basic Metabolic Panel: Recent Labs  Lab 12/10/19 0336 12/10/19 0630 12/11/19 0257  NA 132* 129* 137  K 6.5* 7.5* 4.6  CL 93* 88* 95*  CO2 26 26 28   GLUCOSE 100* 141* 102*  BUN 54* 58* 56*  CREATININE 8.52* 8.45* 7.47*  CALCIUM 8.6* 8.5* 8.9  PHOS  --   --  3.8   Liver Function Tests: Recent Labs  Lab 12/11/19 0257  ALBUMIN 2.7*   No results for input(s): LIPASE, AMYLASE in the last 168 hours. No results for input(s): AMMONIA in the last 168 hours. CBC: Recent Labs  Lab 12/08/19 1252 12/10/19 0336 12/11/19 0257  WBC 6.5 6.9 6.4  HGB 10.7* 9.5* 9.0*  HCT 33.8* 31.5* 29.6*  MCV 96.0 97.2 96.4  PLT 158 132* 134*   Cardiac Enzymes: No results for input(s): CKTOTAL, CKMB, CKMBINDEX, TROPONINI in the last 168 hours. CBG: No results for input(s): GLUCAP in the last 168 hours.  Studies/Results: No results found. Medications:  . acetaminophen  650 mg Oral Q8H  . aspirin  325 mg Oral Daily  . calcium acetate  2,668 mg Oral TID WC  . cinacalcet  30 mg Oral Q M,W,F-HD  . clopidogrel  75 mg Oral Daily  . [START ON 12/15/2019] darbepoetin (ARANESP) injection - DIALYSIS  150 mcg Intravenous Q Mon-HD  . doxercalciferol  4 mcg Intravenous Q M,W,F-HD  . escitalopram  5 mg Oral Daily  .  famotidine  20 mg Oral Daily  . feeding supplement (NEPRO CARB STEADY)  237 mL Oral Daily  . heparin injection (subcutaneous)  5,000 Units Subcutaneous Q8H  . levothyroxine  137 mcg Oral QAC breakfast  . rosuvastatin  10 mg Oral Daily  . traZODone  100 mg Oral QHS  . zolpidem  10 mg Oral QHS

## 2019-12-11 NOTE — Progress Notes (Signed)
Physical Therapy Treatment Patient Details Name: [name] MRN: [id number] DOB: [birth year] Today's Date: 12/11/2019    History of Present Illness 54 y/o gentleman with history of ESRD on HD, HTN, nonhealing surgical wounds on LLE from prior AV grafts, recent history of septic arthritis in left knee, severe AS, CAD, spina bifida with  Myelomeningocele, L great toe amputation, nephrostomy, anxiety who presents with progressive right knee pain that began 4 days ago. s/p I&D RLE due to spontaneous hemarthrosis vs septic arthosis.    PT Comments    Pt supine on arrival, agreeable to therapy session with encouragement. Pt performed bed mobility with Supervision and sit<>stand/gait with min guard for safety. Pt performed gait trials x2 up to 53ft using bari RW and min guard, remained up on toilet at end of session in reach of call bell, RN/NT/US notified. Pt continues to benefit from PT services to progress toward functional mobility goals. D/C recs below remain appropriate.  Follow Up Recommendations  Home health PT;Supervision for mobility/OOB     Equipment Recommendations  None recommended by PT    Recommendations for Other Services       Precautions / Restrictions Precautions Precautions: Fall Restrictions Weight Bearing Restrictions: Yes RLE Weight Bearing: Weight bearing as tolerated    Mobility  Bed Mobility Overal bed mobility: Needs Assistance Bed Mobility: Supine to Sit;Sit to Supine     Supine to sit: Supervision;HOB elevated Sit to supine: Supervision;HOB elevated   General bed mobility comments: for safety, increased time to lift LEs into bed upon return to supine, needs assist with bed sheets  Transfers Overall transfer level: Needs assistance Equipment used: Rolling walker (2 wheeled) Transfers: Sit to/from Stand Sit to Stand: Min guard;From elevated surface         General transfer comment: elevated bed height 5-6" per home setup; x3  reps  Ambulation/Gait Ambulation/Gait assistance: Min guard Gait Distance (Feet): 15 Feet (ft x2) Assistive device: Rolling walker (2 wheeled) Gait Pattern/deviations: Step-through pattern;Decreased stride length;Trunk flexed;Wide base of support (crouched posture, B feet plantarflexed 2/2 spina bifida) Gait velocity: decr   General Gait Details: Min guard for safety, pt ambulating with forward flexed posture and wide BOS/outtoeing   Stairs             Wheelchair Mobility    Modified Rankin (Stroke Patients Only)       Balance Overall balance assessment: Needs assistance Sitting-balance support: No upper extremity supported;Feet supported Sitting balance-Leahy Scale: Good     Standing balance support: Bilateral upper extremity supported;During functional activity Standing balance-Leahy Scale: Poor Standing balance comment: reliant on RW and min guard for safety                            Cognition Arousal/Alertness: Awake/alert Behavior During Therapy: WFL for tasks assessed/performed Overall Cognitive Status: Within Functional Limits for tasks assessed                                        Exercises Other Exercises Other Exercises: pt given cues for GS, QS, ankle pumps TID as able when RLE too painful for heel slides; reviewed LLE bed therex also    General Comments General comments (skin integrity, edema, etc.): pt denies dizziness with transfers; SpO2 100% and HR 84 on RA      Pertinent Vitals/Pain Pain Assessment: Faces Faces Pain Scale:  Hurts even more Pain Location: R knee Pain Descriptors / Indicators: Grimacing;Guarding;Sore Pain Intervention(s): Monitored during session;Premedicated before session;Repositioned;Ice applied    Home Living                      Prior Function            PT Goals (current goals can now be found in the care plan section) Acute Rehab PT Goals Patient Stated Goal: pain control,  go home PT Goal Formulation: With patient Time For Goal Achievement: 12/24/19 Potential to Achieve Goals: Good Progress towards PT goals: Progressing toward goals    Frequency    Min 5X/week      PT Plan Current plan remains appropriate    Co-evaluation              AM-PAC PT "6 Clicks" Mobility   Outcome Measure  Help needed turning from your back to your side while in a flat bed without using bedrails?: A Little Help needed moving from lying on your back to sitting on the side of a flat bed without using bedrails?: A Little Help needed moving to and from a bed to a chair (including a wheelchair)?: A Little Help needed standing up from a chair using your arms (e.g., wheelchair or bedside chair)?: A Little Help needed to walk in hospital room?: A Little Help needed climbing 3-5 steps with a railing? : A Little 6 Click Score: 18    End of Session Equipment Utilized During Treatment: Gait belt Activity Tolerance: Patient tolerated treatment well;Patient limited by pain Patient left: Other (comment);with call bell/phone within reach (RN/NT/US notified pt up on toilet for BM, pt notified to pull call bell and not get up by himself) Nurse Communication: Mobility status;Patient requests pain meds PT Visit Diagnosis: Other abnormalities of gait and mobility (R26.89);Muscle weakness (generalized) (M62.81)     Time: 1637-1700 PT Time Calculation (min) (ACUTE ONLY): 23 min  Charges:  $Gait Training: 8-22 mins $Therapeutic Activity: 8-22 mins                     Aleda Madl P., PTA Acute Rehabilitation Services Pager: [phone] Office: [place] 12/11/2019, 5:13 PM

## 2019-12-11 NOTE — Evaluation (Signed)
Occupational Therapy Evaluation Patient Details Name: [name] MRN: [id number] DOB: 03-19-1965 Today's Date: 12/11/2019    History of Present Illness 54 y/o gentleman with history of ESRD on HD, HTN, nonhealing surgical wounds on LLE from prior AV grafts, recent history of septic arthritis in left knee, severe AS, CAD, spina bifida with  Myelomeningocele, L great toe amputation, nephrostomy, anxiety who presents with progressive right knee pain that began 4 days ago. s/p I&D RLE due to spontaneous hemarthrosis vs septic arthosis.   Clinical Impression   PTA, pt lives with family and reports Modified Independence with ADLs, simple iADLs in the home, and mobility using RW or wheelchair pending fatigue levels. Pt presents now with pain in R knee limited ability to mobilize at this time, as well as dizziness sitting EOB (see BP readings below). Pt overall Supervision for bed mobility, Min A for LB ADLs at this time. 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 Pt interested in obtaining a sock aide. Collaborated with pt on home setup, routine and energy conservation. Anticipate no skilled OT services needed at discharge, but pt would benefit from continued OT services at acute level to maximize ability to mobilize to/from bathroom and assess carryover for AE use.     Follow Up Recommendations  No OT follow up;Supervision - Intermittent    Equipment Recommendations  None recommended by OT (appears well equipped; encouraged pt to look into AE for LB ADLs)    Recommendations for Other Services       Precautions / Restrictions Precautions Precautions: Fall Restrictions Weight Bearing Restrictions: Yes RLE Weight Bearing: Weight bearing as tolerated      Mobility Bed Mobility Overal bed mobility: Needs Assistance Bed Mobility: Supine to Sit;Sit to Supine     Supine to sit: Supervision;HOB elevated Sit to supine:  Supervision;HOB elevated   General bed mobility comments: for safety, increased time to lift LEs into bed upon return to supine.    Transfers                 General transfer comment: pt declined due to pain, dizziness    Balance Overall balance assessment: Needs assistance Sitting-balance support: No upper extremity supported;Feet supported Sitting balance-Leahy Scale: Good                                     ADL either performed or assessed with clinical judgement   ADL Overall ADL's : Needs assistance/impaired Eating/Feeding: Independent;Sitting   Grooming: Set up;Sitting   Upper Body Bathing: Set up;Sitting   Lower Body Bathing: Minimal assistance;Sit to/from stand;Sitting/lateral leans   Upper Body Dressing : Set up;Sitting   Lower Body Dressing: Minimal assistance;Sit to/from stand;Sitting/lateral leans Lower Body Dressing Details (indicate cue type and reason): Min A with difficulty reaching feet. femoral cath for HD so unable to bend forward safely. Pt reports using a reacher for LB dressing. Educated on use of sock aide with pt able to return demo (reports he wears grip socks at home frequently). Pt interested in sock aide     Toileting- Clothing Manipulation and Hygiene: Set up;Sitting/lateral lean;Sit to/from stand         General ADL Comments: Pt with limitation in pain, some dizziness sitting EOB after pain meds administration, so unable to mobilize at this time.     Vision Baseline Vision/History: Wears glasses  Wears Glasses: Reading only Patient Visual Report: No change from baseline Vision Assessment?: No apparent visual deficits     Perception     Praxis      Pertinent Vitals/Pain Pain Assessment: Faces Faces Pain Scale: Hurts even more Pain Location: R knee Pain Descriptors / Indicators: Grimacing;Guarding;Sore Pain Intervention(s): Monitored during session;Premedicated before session;Repositioned;Ice applied     Hand  Dominance Right   Extremity/Trunk Assessment Upper Extremity Assessment Upper Extremity Assessment: Overall WFL for tasks assessed   Lower Extremity Assessment Lower Extremity Assessment: Defer to PT evaluation   Cervical / Trunk Assessment Cervical / Trunk Assessment: Normal   Communication Communication Communication: No difficulties   Cognition Arousal/Alertness: Awake/alert Behavior During Therapy: WFL for tasks assessed/performed Overall Cognitive Status: Within Functional Limits for tasks assessed                                     General Comments  Pt reporting dizziness sitting EOB. Assessed BP at R forearm due to abnormality noted on R bicep (pt reports graft site and no issue with BP there). On R forearm, 120/100. On R bicep, 92/68.    Exercises     Shoulder Instructions      Home Living Family/patient expects to be discharged to:: Private residence Living Arrangements: Other relatives (stepsister) Available Help at Discharge: Family;Available PRN/intermittently Type of Home: House Home Access: Level entry;Stairs to enter Entrance Stairs-Number of Steps: 1   Home Layout: One level     Bathroom Shower/Tub: Other (comment) (sponge bathes)   Bathroom Toilet: Standard Bathroom Accessibility: Yes How Accessible: Accessible via wheelchair Home Equipment: Wheelchair - manual;Wheelchair - power;Hospital bed;Walker - 2 wheels;Adaptive equipment Adaptive Equipment: Reacher        Prior Functioning/Environment Level of Independence: Independent with assistive device(s)        Comments: pt reports using RW for mobility, occasionally w/c if he is feeling tired. Has been getting HHPT, schedules it on days opposite dialysis due to history of dizziness and chest pain post-HD.        OT Problem List: Decreased strength;Decreased activity tolerance;Impaired balance (sitting and/or standing);Pain      OT Treatment/Interventions: Self-care/ADL  training;Therapeutic exercise;Energy conservation;DME and/or AE instruction;Therapeutic activities;Patient/family education;Balance training    OT Goals(Current goals can be found in the care plan section) Acute Rehab OT Goals Patient Stated Goal: pain control, go home OT Goal Formulation: With patient Time For Goal Achievement: 12/18/19 Potential to Achieve Goals: Good ADL Goals Pt Will Perform Grooming: with modified independence;standing Pt Will Perform Lower Body Dressing: with modified independence;with adaptive equipment;sit to/from stand;sitting/lateral leans Pt Will Transfer to Toilet: with modified independence;ambulating;regular height toilet  OT Frequency: Min 2X/week   Barriers to D/C:            Co-evaluation              AM-PAC OT "6 Clicks" Daily Activity     Outcome Measure Help from another person eating meals?: None Help from another person taking care of personal grooming?: A Little Help from another person toileting, which includes using toliet, bedpan, or urinal?: A Little Help from another person bathing (including washing, rinsing, drying)?: A Little Help from another person to put on and taking off regular upper body clothing?: A Little Help from another person to put on and taking off regular lower body clothing?: A Little 6 Click Score: 19   End of Session Nurse  Communication: Mobility status;Patient requests pain meds;Weight bearing status  Activity Tolerance: Patient limited by pain Patient left: in bed;with call bell/phone within reach;with bed alarm set  OT Visit Diagnosis: Unsteadiness on feet (R26.81);Other abnormalities of gait and mobility (R26.89);Pain Pain - Right/Left: Right Pain - part of body: Knee                Time: 6283-1517 OT Time Calculation (min): 27 min Charges:  OT General Charges $OT Visit: 1 Visit OT Evaluation $OT Eval Low Complexity: 1 Low OT Treatments $Self Care/Home Management : 8-22 mins  [name],  OTR/L  [name] 12/11/2019, 10:23 AM

## 2019-12-11 NOTE — Progress Notes (Signed)
HD#2 Subjective:  Overnight Events: Afebrile overnight. No acute events.  Reports continued pain and swelling in the right knee. States knee swelling worse with bending especially affeer sitting to use the bathroom twice this morning. Denies fever, chest pain, chills, nausea, and vomiting.   Objective:  Vital signs in last 24 hours: Vitals:   12/10/19 1556 12/10/19 1700 12/10/19 2015 12/11/19 0354  BP: (!) 130/50 (!) 106/59 (!) 101/52 (!) 92/52  Pulse: 70 72 71 63  Resp:  17 17 17   Temp:  99.9 F (37.7 C) 99.2 F (37.3 C) 98.7 F (37.1 C)  TempSrc:  Oral Oral Oral  SpO2:  96% 100% 97%  Weight:      Height:       Supplemental O2: Room Air SpO2: 97 %   Physical Exam:  Physical Exam Constitutional:      Appearance: Normal appearance.  Cardiovascular:     Rate and Rhythm: Normal rate and regular rhythm.     Heart sounds: Murmur (systolic ejection murmur) heard.    Abdominal:     General: Abdomen is flat.     Palpations: Abdomen is soft.  Musculoskeletal:     Comments: Right knee with TTP of medial and lateral to patella, effusion present appears fuller than yesterday, limited ROM of right knee secondary to pain  Skin:    General: Skin is warm and dry.     Capillary Refill: Capillary refill takes less than 2 seconds.  Neurological:     General: No focal deficit present.     Mental Status: He is alert. Mental status is at baseline.     Filed Weights   12/09/19 0753 12/10/19 0750 12/10/19 1215  Weight: 116.5 kg 119.3 kg 117.6 kg     Intake/Output Summary (Last 24 hours) at 12/11/2019 0629 Last data filed at 12/11/2019 0300 Gross per 24 hour  Intake 390 ml  Output 2500 ml  Net -2110 ml   Net IO Since Admission: -1,630 mL [12/11/19 0629]  Pertinent Labs: CBC Latest Ref Rng & Units 12/11/2019 12/10/2019 12/08/2019  WBC 4.0 - 10.5 K/uL 6.4 6.9 6.5  Hemoglobin 13.0 - 17.0 g/dL 9.0(L) 9.5(L) 10.7(L)  Hematocrit 39.0 - 52.0 % 29.6(L) 31.5(L) 33.8(L)  Platelets  150 - 400 K/uL 134(L) 132(L) 158    CMP Latest Ref Rng & Units 12/11/2019 12/10/2019 12/10/2019  Glucose 70 - 99 mg/dL 102(H) 141(H) 100(H)  BUN 6 - 20 mg/dL 56(H) 58(H) 54(H)  Creatinine 0.61 - 1.24 mg/dL 7.47(H) 8.45(H) 8.52(H)  Sodium 135 - 145 mmol/L 137 129(L) 132(L)  Potassium 3.5 - 5.1 mmol/L 4.6 7.5(HH) 6.5(HH)  Chloride 98 - 111 mmol/L 95(L) 88(L) 93(L)  CO2 22 - 32 mmol/L 28 26 26   Calcium 8.9 - 10.3 mg/dL 8.9 8.5(L) 8.6(L)  Total Protein 6.5 - 8.1 g/dL - - -  Total Bilirubin 0.3 - 1.2 mg/dL - - -  Alkaline Phos 38 - 126 U/L - - -  AST 15 - 41 U/L - - -  ALT 0 - 44 U/L - - -    Ref Range & Units 1 d ago  Color, Synovial YELLOW REDAbnormal   Appearance-Synovial CLEAR BLOODYAbnormal   Crystals, Fluid  NO CRYSTALS SEEN   WBC, Synovial 0 - 200 /cu mm 8,540High   Comment: COUNT MAY BE INACCURATE DUE TO FIBRIN CLUMPS.  Neutrophil, Synovial 0 - 25 % 95High   Lymphocytes-Synovial Fld 0 - 20 % 0   Monocyte-Macrophage-Synovial Fluid 50 - 90 % 5Low  Eosinophils-Synovial 0 - 1 % 0    I  Assessment/Plan:   Active Problems:   ESRD on dialysis ([place])   Septic arthritis (HCC)   Hemarthrosis, right knee   Patient Summary: [name] is a 54 y/o gentleman with history of ESRD on HD, HTN, nonhealing surgical wounds from prior AV grafts, recent history of septic arthritis in left knee, severe AS, CAD, spina bifida with  Myelomeningocele who presents with progressive right knee pain that began 4 days ago. He was febrile without leukocytosis. Ortho was consulted and took rapidly to the OR for washout due to concern for septic arthritis.   Right knee effusion like due to hemarthrosis  Afebrile overnight, continues to have significant swelling and pain in the right knee. He denies history of easily bleeding or bruising, not on blood thinners. Joint fluid and blood cultures continue to be negative. More likely this is due to hemarthrosis, will stop antibiotics and check PT-INR,  APTT for bleeding disorders. As joint swelling seems to be increasing will ask ortho to reevaluate.  -appreciate ortho consult. S/p washout and surgical cultures were obtained on 12/7 - continue to follow cultures -dilaudid 1 mg q 2 hours prn pain, phenergan prn nausea  ESRD on HD -appreciate nephrology consult for inpatient HD management. K normalized this morning - monitor renal function - MWF dialysis   Hyperkalemia Resolved with dialysis yesterday - monitor electrolytes   Chronic anxiety -continue hom lexapro and ativan   CAD, PAD -continue home plavix, aspirin and statin   DVT ppx: heparin Diet: Renal  CODE: FULL   Dispo: Anticipated discharge to Home in 1 days.   Darrell Beard, MD 12/11/2019, 6:29 AM Pager: [phone]  Please contact the on call pager after 5 pm and on weekends at [phone].

## 2019-12-11 NOTE — TOC Initial Note (Addendum)
Transition of Care [facility]) - Initial/Assessment Note    Patient Details  Name: [name] MRN: [id number] Date of Birth: 1965-02-10  Transition of [facility]) CM/SW Contact:    Sharin Mons, RN Phone Number: 12/11/2019, 9:58 AM  Clinical Narrative:     Presents with LE weakness, pain in right knee.               - s/p I&D R Knee, 12/7 From home with family, step-sister. States prior to admit active with [facility], RN,PT. Pt will need resumption orders for [facility] services @ d/c. Pt states would like to continue with [facility]. Pt already has rolling walker and W/C. Pt without Rx med concerns or affordability. States will have transportation to home @ d/c.   TOC team will continue to monitor and follow for  TOC needs .Marland Kitchen...  Expected Discharge Plan: [place] Barriers to Discharge: Continued Medical Work up   Patient Goals and CMS Choice Patient states their goals for this hospitalization and ongoing recovery are:: to go home and get better   Choice offered to / list presented to : Patient  Expected Discharge Plan and Services Expected Discharge Plan: [place]   Discharge Planning Services: CM Consult Post Acute Care Choice: [place] Living arrangements for the past 2 months: Single Family Home                           HH Arranged: RN,PT [place] Agency: [facility] Date [place]: 12/11/19 Time [place]: [id number] Representative spoke with at [place]: [place] Arrangements/Services Living arrangements for the past 2 months: [place] with:: Other (Comment) (Step sister) Patient language and need for interpreter reviewed:: Yes Do you feel safe going back to the place where you live?: Yes      Need for Family Participation in Patient Care: Yes (Comment) Care giver support system in place?: Yes (comment) Current home services: Home PT,Home  RN Criminal Activity/Legal Involvement Pertinent to Current Situation/Hospitalization: No - Comment as needed  Activities of Daily [place] Devices/Equipment: Hospital bed,Walker (specify type),Wheelchair ADL Screening (condition at time of admission) Patient's cognitive ability adequate to safely complete daily activities?: Yes Is the patient deaf or have difficulty hearing?: No Does the patient have difficulty seeing, even when wearing glasses/contacts?: No Does the patient have difficulty concentrating, remembering, or making decisions?: No Patient able to express need for assistance with ADLs?: Yes Does the patient have difficulty dressing or bathing?: No Independently performs ADLs?: Yes (appropriate for developmental age) Does the patient have difficulty walking or climbing stairs?: Yes Weakness of Legs: Left Weakness of Arms/Hands: None  Permission Sought/Granted   Permission granted to share information with : Yes, Verbal Permission Granted              Emotional Assessment Appearance:: Appears stated age Attitude/Demeanor/Rapport: Engaged Affect (typically observed): Accepting Orientation: : Oriented to Self,Oriented to Place,Oriented to  Time,Oriented to Situation   Psych Involvement: No (comment)  Admission diagnosis:  Septic arthritis ([place]) [M00.9] End-stage renal disease on hemodialysis ([place]) [N18.6, Z99.2] Pyogenic arthritis of right knee joint, due to unspecified organism [facility]) [M00.9] Patient Active Problem List   Diagnosis Date Noted  . Hemarthrosis, right knee 12/10/2019  . Septic arthritis ([place]) 12/09/2019  . Chest pressure   . Coronary artery calcification   . Chest pain   .  Sepsis without acute organ dysfunction ([place])   . Vascular graft infection ([place])   . Wound infection   . Pyogenic arthritis of left knee joint ([place])   . Pain and swelling of knee, left   . Complication associated with dialysis catheter   . Hyperkalemia   .  Dysphagia   . Ureteral stone with hydronephrosis 03/26/2019  . Malfunction of nephrostomy tube ([place])   . Nephrostomy complication ([place]) 11/94/1740  . Abnormal CT scan, gastrointestinal tract   . Flank pain 11/26/2018  . Nephrolithiasis 11/26/2018  . Hydronephrosis with renal and ureteral calculus obstruction 11/26/2018  . Left flank pain 11/26/2018  . ESRD on dialysis ([place]) 06/12/2018  . PAD (peripheral artery disease) ([place]) 12/22/2017  . Problem with dialysis access ([place]) 12/21/2017  . Anemia 12/21/2017  . Hypothyroidism 12/21/2017  . GERD (gastroesophageal reflux disease) 12/21/2017  . Anxiety 12/21/2017  . Left great toe amputee ([place]) 12/19/2017  . Endocarditis of mitral valve   . Osteomyelitis of toe of left foot ([place])   . MSSA bacteremia 12/08/2017  . Fever   . Right flank pain   . ESRD (end stage renal disease) ([place]) 09/20/2017  . ESRD (end stage renal disease) on dialysis ([place]) 08/07/2017  . Nonhealing surgical wound 02/29/2016  . Clotted dialysis access ([place]) 01/14/2016  . Removal of staples 07/19/2012  . Complication of AV dialysis fistula 06/27/2012  . End stage renal disease ([place]) 06/27/2012   PCP:  [name], MD Pharmacy:   [id number] - [place], [place] [address] [place] [address] Phone: [phone] Fax: [phone]     Social Determinants of Health (SDOH) Interventions    Readmission Risk Interventions No flowsheet data found.

## 2019-12-11 NOTE — Plan of Care (Signed)

## 2019-12-12 LAB — CBC
HCT: 29.6 % — ABNORMAL LOW (ref 39.0–52.0)
Hemoglobin: 9.3 g/dL — ABNORMAL LOW (ref 13.0–17.0)
MCH: 30.1 pg (ref 26.0–34.0)
MCHC: 31.4 g/dL (ref 30.0–36.0)
MCV: 95.8 fL (ref 80.0–100.0)
Platelets: 152 10*3/uL (ref 150–400)
RBC: 3.09 MIL/uL — ABNORMAL LOW (ref 4.22–5.81)
RDW: 16.3 % — ABNORMAL HIGH (ref 11.5–15.5)
WBC: 5.9 10*3/uL (ref 4.0–10.5)
nRBC: 0 % (ref 0.0–0.2)

## 2019-12-12 LAB — BODY FLUID CULTURE: Culture: NO GROWTH

## 2019-12-12 LAB — BASIC METABOLIC PANEL
Anion gap: 16 — ABNORMAL HIGH (ref 5–15)
BUN: 72 mg/dL — ABNORMAL HIGH (ref 6–20)
CO2: 27 mmol/L (ref 22–32)
Calcium: 9.1 mg/dL (ref 8.9–10.3)
Chloride: 91 mmol/L — ABNORMAL LOW (ref 98–111)
Creatinine, Ser: 9.01 mg/dL — ABNORMAL HIGH (ref 0.61–1.24)
GFR, Estimated: 6 mL/min — ABNORMAL LOW (ref >60)
Glucose, Bld: 92 mg/dL (ref 70–99)
Potassium: 5.6 mmol/L — ABNORMAL HIGH (ref 3.5–5.1)
Sodium: 134 mmol/L — ABNORMAL LOW (ref 135–145)

## 2019-12-12 MED ORDER — HYDROMORPHONE HCL 1 MG/ML IJ SOLN
1.0000 mg | INTRAMUSCULAR | Status: DC | PRN
  Administered 2019-12-12 – 2019-12-13 (×4): 1 mg via INTRAVENOUS
  Filled 2019-12-12 (×4): qty 1

## 2019-12-12 MED ORDER — HEPARIN SODIUM (PORCINE) 1000 UNIT/ML IJ SOLN
INTRAMUSCULAR | Status: AC
  Administered 2019-12-12: 2400 [IU] via INTRAVENOUS_CENTRAL
  Filled 2019-12-12: qty 3

## 2019-12-12 MED ORDER — HEPARIN SODIUM (PORCINE) 1000 UNIT/ML DIALYSIS: 20.0000 [IU]/kg | INTRAMUSCULAR | Status: DC | PRN

## 2019-12-12 MED ORDER — OXYCODONE HCL 5 MG PO TABS
10.0000 mg | ORAL_TABLET | ORAL | Status: DC | PRN
  Administered 2019-12-12 – 2019-12-19 (×20): 10 mg via ORAL
  Filled 2019-12-12 (×23): qty 2

## 2019-12-12 MED ORDER — LIDOCAINE-PRILOCAINE 2.5-2.5 % EX CREA: 1.0000 "application " | TOPICAL_CREAM | CUTANEOUS | Status: DC | PRN

## 2019-12-12 MED ORDER — PROMETHAZINE HCL 25 MG/ML IJ SOLN
INTRAMUSCULAR | Status: AC
  Filled 2019-12-12: qty 1

## 2019-12-12 MED ORDER — SODIUM CHLORIDE 0.9 % IV SOLN: 100.0000 mL | INTRAVENOUS | Status: DC | PRN

## 2019-12-12 MED ORDER — ALTEPLASE 2 MG IJ SOLR: 2.0000 mg | Freq: Once | INTRAMUSCULAR | Status: AC | PRN

## 2019-12-12 MED ORDER — LIDOCAINE HCL (PF) 1 % IJ SOLN: 5.0000 mL | INTRAMUSCULAR | Status: DC | PRN

## 2019-12-12 MED ORDER — ALTEPLASE 2 MG IJ SOLR
INTRAMUSCULAR | Status: AC
  Administered 2019-12-12: 4 mg
  Filled 2019-12-12: qty 4

## 2019-12-12 MED ORDER — DOXERCALCIFEROL 4 MCG/2ML IV SOLN
INTRAVENOUS | Status: AC
  Administered 2019-12-12: 4 ug via INTRAVENOUS
  Filled 2019-12-12: qty 2

## 2019-12-12 MED ORDER — HEPARIN SODIUM (PORCINE) 1000 UNIT/ML DIALYSIS: 1000.0000 [IU] | INTRAMUSCULAR | Status: DC | PRN

## 2019-12-12 MED ORDER — PENTAFLUOROPROP-TETRAFLUOROETH EX AERO: 1.0000 "application " | INHALATION_SPRAY | CUTANEOUS | Status: DC | PRN

## 2019-12-12 NOTE — Progress Notes (Addendum)
[facility] Progress Note   Subjective:   Patient seen and examined on HD. Tolerating HD well so far using R thigh TDC.  Short of breath when he first arrived on unit but now improving.  Denies CP, abdominal pain, dizziness and fatigue.  A little more pain this morning.    Objective Vitals:   12/11/19 1204 12/11/19 1429 12/11/19 2026 12/12/19 0735  BP: (!) 149/62 (!) 108/56 126/77 115/63  Pulse: 68 71 71 67  Resp: 17 16 16 17   Temp: 99.1 F (37.3 C) 97.6 F (36.4 C) 97.9 F (36.6 C) 98.7 F (37.1 C)  TempSrc: Oral Oral Oral Oral  SpO2: 100% 99% 100% 100%  Weight:      Height:       Physical Exam General:chronically ill appearing male in NAD Heart:RRR, +3/0 systolic murmur Lungs:CTAB anteriolaterally  Abdomen:soft, NTND Extremities:no LE edema Dialysis Access: R TDC in use   Filed Weights   12/09/19 0753 12/10/19 0750 12/10/19 1215  Weight: 116.5 kg 119.3 kg 117.6 kg    Intake/Output Summary (Last 24 hours) at 12/12/2019 0843 Last data filed at 12/11/2019 1110 Gross per 24 hour  Intake 50 ml  Output --  Net 50 ml    Additional Objective Labs: Basic Metabolic Panel: Recent Labs  Lab 12/10/19 0630 12/11/19 0257 12/12/19 0343  NA 129* 137 134*  K 7.5* 4.6 5.6*  CL 88* 95* 91*  CO2 26 28 27   GLUCOSE 141* 102* 92  BUN 58* 56* 72*  CREATININE 8.45* 7.47* 9.01*  CALCIUM 8.5* 8.9 9.1  PHOS  --  3.8  --    Liver Function Tests: Recent Labs  Lab 12/11/19 0257  ALBUMIN 2.7*   CBC: Recent Labs  Lab 12/08/19 1252 12/10/19 0336 12/11/19 0257 12/11/19 2045  WBC 6.5 6.9 6.4 7.5  HGB 10.7* 9.5* 9.0* 9.3*  HCT 33.8* 31.5* 29.6* 31.1*  MCV 96.0 97.2 96.4 96.3  PLT 158 132* 134* 168   Blood Culture    Component Value Date/Time   SDES SYNOVIAL RIGHT KNEE 12/09/2019 1617   SPECREQUEST FLUID ON SWABS 12/09/2019 1617   CULT  12/09/2019 1617    NO GROWTH 2 DAYS NO ANAEROBES ISOLATED; CULTURE IN PROGRESS FOR 5 DAYS Performed at [facility], [place] [address], [place],  [address]    REPTSTATUS PENDING 12/09/2019 1617    Medications: . sodium chloride    . sodium chloride     . acetaminophen  650 mg Oral Q8H  . aspirin  325 mg Oral Daily  . calcium acetate  2,668 mg Oral TID WC  . Chlorhexidine Gluconate Cloth  6 each Topical Q0600  . cinacalcet  30 mg Oral Q M,W,F-HD  . clopidogrel  75 mg Oral Daily  . [START ON 12/15/2019] darbepoetin (ARANESP) injection - DIALYSIS  150 mcg Intravenous Q Mon-HD  . doxercalciferol  4 mcg Intravenous Q M,W,F-HD  . escitalopram  5 mg Oral Daily  . famotidine  20 mg Oral Daily  . feeding supplement (NEPRO CARB STEADY)  237 mL Oral Daily  . heparin injection (subcutaneous)  5,000 Units Subcutaneous Q8H  . levothyroxine  137 mcg Oral QAC breakfast  . promethazine      . rosuvastatin  10 mg Oral Daily  . traZODone  100 mg Oral QHS  . zolpidem  10 mg Oral QHS    Dialysis Orders: [facility], MWF, EDW 116.5 kg, 2K, 2.25 calcium bath, right femoral PermCath, Sensipar 30 mg q.  hemodialysis, Hectorol 4 mics q. dialysis, heparin 3700 units Mircera 150 MCG given last on 12/01/2019, Venofer 50 q. hemodialysis weekly  Assessment/Plan: 1. Right knee effusion - spontaneous hemarthrosis vs septic arthritis s/p I&D on 12/7 [facility] & synovial fluid with NGTD. orthopedics following, IV Vanc/Zosyn plan per orthopedics. 2. ESRD -HD on schedule MWF using right femoral PermCath which is close to end-stage Access. Truncated HD today due to high patient volume.  K 5.6.  3. Hyperkalemia-K 5.6 today, Follow daily, noted patient does take as outpatient Kayexalate on Saturday and Sundays, in hospital will use Lokelma if needed over weekend. 4. Hypertension/volume -BP in goal. Close to dry weight, does not appear volume overloaded, UF goal 3.5L today.  5. Anemia -Hgb 9.3 today, continue ESA on schedule - next due 12/13, hold his weekly iron secondary to infected knee 6. Metabolic bone disease - CA and  phos in goal. continue binders and VDRA. 7. Nutrition -renal diet w/fluid restrictions.    Jen Mow, PA-C [facility] 12/12/2019,8:43 AM  LOS: 3 days   Nephrology attending: Patient was seen and examined.  Chart reviewed.  I agree with assessment and plan as outlined above.  Potassium 5.6 today.  Tolerating dialysis well so far.  Clinically doing well.  On broad-spectrum antibiotics, Ortho is following.  Katheran James, MD [facility].

## 2019-12-12 NOTE — Plan of Care (Signed)

## 2019-12-12 NOTE — Progress Notes (Cosign Needed)
Orthopaedic Trauma Service Progress Note  Patient ID: [name] MRN: [id number] DOB/AGE: 54-Sep-1967 54 y.o.  Subjective:  Seen on HD unit  R knee still sore as expected Worked with therapy some yesterday  No acute issues  Intra-op cultures still show now growth from R knee    ROS As above Objective:   VITALS:   Vitals:   12/12/19 0930 12/12/19 1000 12/12/19 1030 12/12/19 1100  BP: 103/62 (!) 79/38 (!) 93/50 (!) 88/58  Pulse: 78 69 69 60  Resp:      Temp:      TempSrc:      SpO2:      Weight:      Height:        Estimated body mass index is 37.02 kg/m as calculated from the following:   Height as of this encounter: 5\' 11"  (1.803 m).   Weight as of this encounter: 120.4 kg.   Intake/Output      12/09 0701 12/10 0700 12/10 0701 12/11 0700   P.O.     IV Piggyback 50    Total Intake(mL/kg) 50 (0.4)    Other     Total Output     Net +50         Urine Occurrence 1 x    Stool Occurrence 2 x      LABS  Results for orders placed or performed during the hospital encounter of 12/08/19 (from the past 24 hour(s))  CBC     Status: Abnormal   Collection Time: 12/11/19  8:45 PM  Result Value Ref Range   WBC 7.5 4.0 - 10.5 K/uL   RBC 3.23 (L) 4.22 - 5.81 MIL/uL   Hemoglobin 9.3 (L) 13.0 - 17.0 g/dL   HCT 31.1 (L) 39.0 - 52.0 %   MCV 96.3 80.0 - 100.0 fL   MCH 28.8 26.0 - 34.0 pg   MCHC 29.9 (L) 30.0 - 36.0 g/dL   RDW 16.2 (H) 11.5 - 15.5 %   Platelets 168 150 - 400 K/uL   nRBC 0.0 0.0 - 0.2 %  Basic metabolic panel     Status: Abnormal   Collection Time: 12/12/19  3:43 AM  Result Value Ref Range   Sodium 134 (L) 135 - 145 mmol/L   Potassium 5.6 (H) 3.5 - 5.1 mmol/L   Chloride 91 (L) 98 - 111 mmol/L   CO2 27 22 - 32 mmol/L   Glucose, Bld 92 70 - 99 mg/dL   BUN 72 (H) 6 - 20 mg/dL   Creatinine, Ser 9.01 (H) 0.61 - 1.24 mg/dL   Calcium 9.1 8.9 - 10.3 mg/dL   GFR, Estimated 6  (L) >60 mL/min   Anion gap 16 (H) 5 - 15  CBC     Status: Abnormal   Collection Time: 12/12/19  7:00 AM  Result Value Ref Range   WBC 5.9 4.0 - 10.5 K/uL   RBC 3.09 (L) 4.22 - 5.81 MIL/uL   Hemoglobin 9.3 (L) 13.0 - 17.0 g/dL   HCT 29.6 (L) 39.0 - 52.0 %   MCV 95.8 80.0 - 100.0 fL   MCH 30.1 26.0 - 34.0 pg   MCHC 31.4 30.0 - 36.0 g/dL   RDW 16.3 (H) 11.5 - 15.5 %   Platelets 152 150 - 400 K/uL   nRBC 0.0  0.0 - 0.2 %     PHYSICAL EXAM:   Gen: in bed, sleeping, easily arousable Ext:       Right Lower Extremity              Dressings c/d/i to right knee  Poke incisions are stable             Effusion still present and stable, I do not appreciate it to be larger than POD 1              No changes in motor or sensory functions              No pitting edema      Assessment/Plan: 3 Days Post-Op   Active Problems:   ESRD on dialysis ([place])   Septic arthritis (HCC)   Hemarthrosis, right knee   Anti-infectives (From admission, onward)   Start     Dose/Rate Route Frequency Ordered Stop   12/12/19 1200  vancomycin (VANCOCIN) IVPB 1000 mg/200 mL premix  Status:  Discontinued        1,000 mg 200 mL/hr over 60 Minutes Intravenous Every M-W-F (Hemodialysis) 12/11/19 0834 12/11/19 0925   12/10/19 1200  vancomycin (VANCOCIN) IVPB 500 mg/100 ml premix        500 mg 100 mL/hr over 60 Minutes Intravenous Every Wed (Hemodialysis) 12/10/19 1031 12/10/19 1150   12/10/19 1042  vancomycin (VANCOCIN) 500-5 MG/100ML-% IVPB       Note to Pharmacy: [name]   : cabinet override      12/10/19 1042 12/10/19 2244   12/09/19 1930  vancomycin (VANCOREADY) IVPB 1500 mg/300 mL        1,500 mg 150 mL/hr over 120 Minutes Intravenous  Once 12/09/19 1847 12/10/19 0022   12/09/19 1849  vancomycin variable dose per unstable renal function (pharmacist dosing)  Status:  Discontinued         Does not apply See admin instructions 12/09/19 1849 12/11/19 0834   12/09/19 1730  piperacillin-tazobactam  (ZOSYN) IVPB 2.25 g  Status:  Discontinued        2.25 g 100 mL/hr over 30 Minutes Intravenous Every 8 hours 12/09/19 1702 12/11/19 0925   12/09/19 1545  ceFAZolin (ANCEF) IVPB 2g/100 mL premix        2 g 200 mL/hr over 30 Minutes Intravenous On call to O.R. 12/09/19 1514 12/09/19 1600   12/09/19 1525  ceFAZolin (ANCEF) 2-4 GM/100ML-% IVPB       Note to Pharmacy: [name]   : cabinet override      12/09/19 1525 12/09/19 1612   12/09/19 0930  vancomycin (VANCOCIN) IVPB 1000 mg/200 mL premix        1,000 mg 200 mL/hr over 60 Minutes Intravenous  Once 12/09/19 0930 12/09/19 1120    .  POD/HD#: 78  54 y/o male with complex medical history with acute R knee pain, L groin wound infection and L knee septic arthritis 2 months ago     - R knee spontaneous hemarthrosis vs septic arthritis s/p I&D             Follow cultures              Intra-op appears more hemarthrosis than septic joint but overall clinical picture is complex                          Fluid analysis not necessarily consistent with septic joint but recent infections does cause  concern              no indications to return to OR               WBAT R leg with assistance             ROM as tolerated R knee             PT/OT evals             Ice and elevate              TED hose ok if pt desires                Dressing changes as needed                     Dc sutures in 10 days      - Dispo:             Per other services             Ortho issues stable             Follow up in 10-14 days for suture removal   Ortho will sign off    Jari Pigg, PA-C [phone] (C) 12/12/2019, 11:28 AM  [facility] [place] [address] [phone] Jenetta [phone] (F)    After 5pm and on the weekends please log on to Amion, go to orthopaedics and the look under the [facility] Call for the provider(s) on call. You can also call our office at [phone] and then  follow the prompts to be connected to the call team.

## 2019-12-12 NOTE — Progress Notes (Signed)
OT Cancellation Note  Patient Details Name: [name] MRN: [id number] DOB: September 18, 1965   Cancelled Treatment:    Reason Eval/Treat Not Completed: Patient at procedure or test/ unavailable. Pt off unit at HD this AM. Will attempt OT session as appropriate and as time allows.   [name] 12/12/2019, 9:15 AM

## 2019-12-12 NOTE — Plan of Care (Signed)

## 2019-12-12 NOTE — Progress Notes (Signed)
Patient to dialysis.

## 2019-12-12 NOTE — Progress Notes (Signed)
PT Cancellation Note  Patient Details Name: [name] MRN: [id number] DOB: 1965/05/18   Cancelled Treatment:    Reason Eval/Treat Not Completed: (P) Patient at procedure or test/unavailable (pt at HD dept -AM) Will continue efforts per PT POC as schedule permits.   Bo Teicher M Jekhi Bolin 12/12/2019, 8:45 AM

## 2019-12-12 NOTE — Progress Notes (Signed)
HD#3 Subjective:  Overnight Events: Afebrile overnight. No acute events.  Patient evaluated at dialysis today. This morning having strange sensation in both his fingers that started during dialysis, has not had this sensation during dialysis prior. States pain medication help some with his knee pain but feels that the swelling has not improved much since yesterday and tends to worsen the more he bends it.  Objective:  Vital signs in last 24 hours: Vitals:   12/11/19 0721 12/11/19 1204 12/11/19 1429 12/11/19 2026  BP: 101/61 (!) 149/62 (!) 108/56 126/77  Pulse: 64 68 71 71  Resp: 14 17 16 16   Temp: 98.4 F (36.9 C) 99.1 F (37.3 C) 97.6 F (36.4 C) 97.9 F (36.6 C)  TempSrc: Oral Oral Oral Oral  SpO2: 96% 100% 99% 100%  Weight:      Height:       Supplemental O2: Room Air SpO2: 100 %   Physical Exam:  Physical Exam Constitutional:      Appearance: Normal appearance.  Cardiovascular:     Rate and Rhythm: Normal rate and regular rhythm.     Heart sounds: Murmur: systolic ejection murmur.    Abdominal:     General: Abdomen is flat.     Palpations: Abdomen is soft.  Musculoskeletal:     Comments: Right knee effusion appears similar to yesterday with tenderness to palpation. Surgical incisions clean and dressed  Skin:    General: Skin is warm and dry.     Capillary Refill: Capillary refill takes less than 2 seconds.  Neurological:     General: No focal deficit present.     Mental Status: He is alert. Mental status is at baseline.     Comments: Normal sensation in bilateral hands, normal strength on hand grip and abduction of fingers     Filed Weights   12/09/19 0753 12/10/19 0750 12/10/19 1215  Weight: 116.5 kg 119.3 kg 117.6 kg     Intake/Output Summary (Last 24 hours) at 12/12/2019 0706 Last data filed at 12/11/2019 1110 Gross per 24 hour  Intake 50 ml  Output --  Net 50 ml   Net IO Since Admission: -1,580 mL [12/12/19 0706]  Pertinent Labs: CBC  Latest Ref Rng & Units 12/11/2019 12/11/2019 12/10/2019  WBC 4.0 - 10.5 K/uL 7.5 6.4 6.9  Hemoglobin 13.0 - 17.0 g/dL 9.3(L) 9.0(L) 9.5(L)  Hematocrit 39.0 - 52.0 % 31.1(L) 29.6(L) 31.5(L)  Platelets 150 - 400 K/uL 168 134(L) 132(L)    CMP Latest Ref Rng & Units 12/12/2019 12/11/2019 12/10/2019  Glucose 70 - 99 mg/dL 92 102(H) 141(H)  BUN 6 - 20 mg/dL 72(H) 56(H) 58(H)  Creatinine 0.61 - 1.24 mg/dL 9.01(H) 7.47(H) 8.45(H)  Sodium 135 - 145 mmol/L 134(L) 137 129(L)  Potassium 3.5 - 5.1 mmol/L 5.6(H) 4.6 7.5(HH)  Chloride 98 - 111 mmol/L 91(L) 95(L) 88(L)  CO2 22 - 32 mmol/L 27 28 26   Calcium 8.9 - 10.3 mg/dL 9.1 8.9 8.5(L)  Total Protein 6.5 - 8.1 g/dL - - -  Total Bilirubin 0.3 - 1.2 mg/dL - - -  Alkaline Phos 38 - 126 U/L - - -  AST 15 - 41 U/L - - -  ALT 0 - 44 U/L - - -    Assessment/Plan:   Active Problems:   ESRD on dialysis ([facility])   Septic arthritis ([facility])   Hemarthrosis, right knee   Patient Summary: Mr. Gatliff is a 54 y/o gentleman with history of ESRD on HD, HTN, nonhealing surgical  wounds from prior AV grafts, recent history of septic arthritis in left knee, severe AS, CAD, spina bifida with  Myelomeningocele who presents with progressive right knee pain that began 4 days ago admitted for concern of septic arthritis, on further workup suspect spontaneous hemarthrosis.    Right knee effusion like due to spontaneous hemarthrosis  Swelling and pain in the right knee similar to yesterday. PT, INR, aPTT wnl low suspicion for bleeding disorder. Cultures remain negative Orthopedics evaluated the patient this morning and signed off.  - continue to follow cultures - Will try to transition to oral pain medications. - Oxycodone 10 mg q4 PRN for pain - IV Dilaudid 1 mg q4 prn breakthrough pain, phenergan prn nausea  ESRD on HD -appreciate nephrology consult for inpatient HD management.  - monitor renal function - MWF dialysis   Chronic anxiety -continue hom lexapro and  ativan   CAD, PAD -continue home plavix, aspirin and statin   DVT ppx: heparin Diet: Renal  CODE: FULL   Dispo: Anticipated discharge to Home in 1 days.   [name], MD 12/12/2019, 7:06 AM Pager: [phone]  Please contact the on call pager after 5 pm and on weekends at [phone].

## 2019-12-12 NOTE — Progress Notes (Signed)
Report given to Woody, RN  

## 2019-12-13 LAB — RENAL FUNCTION PANEL
Albumin: 2.8 g/dL — ABNORMAL LOW (ref 3.5–5.0)
Anion gap: 13 (ref 5–15)
BUN: 61 mg/dL — ABNORMAL HIGH (ref 6–20)
CO2: 25 mmol/L (ref 22–32)
Calcium: 9.4 mg/dL (ref 8.9–10.3)
Chloride: 96 mmol/L — ABNORMAL LOW (ref 98–111)
Creatinine, Ser: 8.89 mg/dL — ABNORMAL HIGH (ref 0.61–1.24)
GFR, Estimated: 6 mL/min — ABNORMAL LOW (ref >60)
Glucose, Bld: 100 mg/dL — ABNORMAL HIGH (ref 70–99)
Phosphorus: 4.1 mg/dL (ref 2.5–4.6)
Potassium: 5.4 mmol/L — ABNORMAL HIGH (ref 3.5–5.1)
Sodium: 134 mmol/L — ABNORMAL LOW (ref 135–145)

## 2019-12-13 LAB — CBC
HCT: 29.4 % — ABNORMAL LOW (ref 39.0–52.0)
Hemoglobin: 8.9 g/dL — ABNORMAL LOW (ref 13.0–17.0)
MCH: 29.2 pg (ref 26.0–34.0)
MCHC: 30.3 g/dL (ref 30.0–36.0)
MCV: 96.4 fL (ref 80.0–100.0)
Platelets: 148 10*3/uL — ABNORMAL LOW (ref 150–400)
RBC: 3.05 MIL/uL — ABNORMAL LOW (ref 4.22–5.81)
RDW: 16.1 % — ABNORMAL HIGH (ref 11.5–15.5)
WBC: 5.9 10*3/uL (ref 4.0–10.5)
nRBC: 0 % (ref 0.0–0.2)

## 2019-12-13 MED ORDER — CHLORHEXIDINE GLUCONATE CLOTH 2 % EX PADS
6.0000 | MEDICATED_PAD | Freq: Every day | CUTANEOUS | Status: DC
  Administered 2019-12-13: 6 via TOPICAL

## 2019-12-13 MED ORDER — HYDROMORPHONE HCL 1 MG/ML IJ SOLN
1.0000 mg | INTRAMUSCULAR | Status: DC | PRN
Start: 2019-12-13 — End: 2019-12-15
  Administered 2019-12-13 – 2019-12-15 (×11): 1 mg via INTRAVENOUS
  Filled 2019-12-13 (×11): qty 1

## 2019-12-13 MED ORDER — LOPERAMIDE HCL 2 MG PO CAPS
2.0000 mg | ORAL_CAPSULE | ORAL | Status: DC | PRN
  Administered 2019-12-13 – 2019-12-14 (×3): 2 mg via ORAL
  Filled 2019-12-13 (×3): qty 1

## 2019-12-13 MED ORDER — PROMETHAZINE HCL 25 MG/ML IJ SOLN
INTRAMUSCULAR | Status: AC
  Administered 2019-12-13: 25 mg
  Filled 2019-12-13: qty 1

## 2019-12-13 MED ORDER — CALCIUM ACETATE (PHOS BINDER) 667 MG PO CAPS
667.0000 mg | ORAL_CAPSULE | Freq: Three times a day (TID) | ORAL | Status: DC
  Administered 2019-12-13 – 2019-12-18 (×10): 667 mg via ORAL
  Filled 2019-12-13 (×16): qty 1

## 2019-12-13 MED ORDER — ALTEPLASE 2 MG IJ SOLR
INTRAMUSCULAR | Status: AC
  Administered 2019-12-13: 2 mg
  Filled 2019-12-13: qty 4

## 2019-12-13 NOTE — Progress Notes (Signed)
HD#4 Subjective:  Overnight Events: No overnight events  Patient resting in bed. States that he has a difficult time getting comfortable due to pain in his knee. Patient states that his had a difficult time sleeping and managing his pain. Patient states that his pain is worse with bending his legs but is able to walk and bear weight on his knee without pain. Patient does admit to some diarrhea overnight but denies any abdominal pain. Otherwise he denies any chest pain, shortness of breath, fever, myalgias, or malaise.  Objective:  Vital signs in last 24 hours: Vitals:   12/12/19 1445 12/12/19 1939 12/13/19 0310 12/13/19 0826  BP: (!) 110/55 134/68 (!) 148/77 (!) 113/49  Pulse: 75 78 66 64  Resp: 17 17 16 17   Temp: 99.5 F (37.5 C) 99.6 F (37.6 C) 97.7 F (36.5 C) 98.9 F (37.2 C)  TempSrc: Oral Oral Oral Oral  SpO2: 100% 96% 100% 99%  Weight:      Height:       Supplemental O2: Room Air SpO2: 99 %   Physical Exam:  Physical Exam Constitutional:      General: He is in acute distress.     Appearance: He is ill-appearing (chronically ill appearing).  HENT:     Head: Normocephalic and atraumatic.  Cardiovascular:     Rate and Rhythm: Normal rate and regular rhythm.     Pulses: Normal pulses.     Heart sounds: Murmur heard.    Pulmonary:     Effort: Pulmonary effort is normal.     Breath sounds: Normal breath sounds.  Abdominal:     General: Abdomen is flat.     Palpations: Abdomen is soft.  Musculoskeletal:        General: Swelling (right knee with tenderness to palpation) present.  Skin:    General: Skin is warm and dry.  Neurological:     General: No focal deficit present.     Mental Status: He is alert.     Filed Weights   12/10/19 1215 12/12/19 0815 12/12/19 1129  Weight: 117.6 kg 120.4 kg 118 kg     Intake/Output Summary (Last 24 hours) at 12/13/2019 0915 Last data filed at 12/13/2019 0500 Gross per 24 hour  Intake --  Output 2653 ml  Net  -2653 ml   Net IO Since Admission: -4,233 mL [12/13/19 0915]  Pertinent Labs: CBC Latest Ref Rng & Units 12/13/2019 12/12/2019 12/11/2019  WBC 4.0 - 10.5 K/uL 5.9 5.9 7.5  Hemoglobin 13.0 - 17.0 g/dL 8.9(L) 9.3(L) 9.3(L)  Hematocrit 39.0 - 52.0 % 29.4(L) 29.6(L) 31.1(L)  Platelets 150 - 400 K/uL 148(L) 152 168    CMP Latest Ref Rng & Units 12/13/2019 12/12/2019 12/11/2019  Glucose 70 - 99 mg/dL 100(H) 92 102(H)  BUN 6 - 20 mg/dL 61(H) 72(H) 56(H)  Creatinine 0.61 - 1.24 mg/dL 8.89(H) 9.01(H) 7.47(H)  Sodium 135 - 145 mmol/L 134(L) 134(L) 137  Potassium 3.5 - 5.1 mmol/L 5.4(H) 5.6(H) 4.6  Chloride 98 - 111 mmol/L 96(L) 91(L) 95(L)  CO2 22 - 32 mmol/L 25 27 28   Calcium 8.9 - 10.3 mg/dL 9.4 9.1 8.9  Total Protein 6.5 - 8.1 g/dL - - -  Total Bilirubin 0.3 - 1.2 mg/dL - - -  Alkaline Phos 38 - 126 U/L - - -  AST 15 - 41 U/L - - -  ALT 0 - 44 U/L - - -    Imaging: No results found.  Assessment/Plan:   Active  Problems:   ESRD on dialysis [facility])   Septic arthritis ([place])   Hemarthrosis, right knee   Patient Summary: [name] is a 54 y/o gentleman with history of ESRD on HD, HTN, nonhealing surgical wounds from prior AV grafts, recent history of septic arthritis in left knee, severe AS, CAD,spina bifida with Myelomeningocele who presents with progressive right knee pain that began 4 days ago admitted for concern of septic arthritis, on further workup suspect spontaneous hemarthrosis.    Right knee effusion like due to spontaneous hemarthrosis  Patient continues to have significant swelling and pain in his right knee. Patient states that none of the pain medicine that he is receiving is helping him. He also states that is having difficult time sleeping because of his pain. All cultures are negative including blood and knee fluid cultures. Patient states that he is tolerating walking on his knee but has a problem with bending his knee particularly when getting up from the toilet.  Investigation for underlying bleeding concerns with PT, INR, APTT have been within normal limits. He does have thrombocytopenia in the 120s but not significant enough to cause spontaneous bleeding. Patient's ESRD could be contributing to his bleeding risk. I will get a vitamin C today to look for underlying vitamin deficiency causing his possible hemarthrosis.  -Vitamin C - continue to follow cultures - Will try to transition to oral pain medications. - Oxycodone 10 mg q4 PRN for pain - IV Dilaudid 1 mg q4 prn breakthrough pain, phenergan prn nausea  ESRD on HD -appreciate nephrology consult for inpatient HD management.  - monitor renal function - MWF dialysis   Chronic anxiety -continue hom lexapro and ativan   CAD with unstable angina/3-vessel disease  Severe Aortic stenosis Patient has significant CAD with severe 3 vessel disease requiring likely CABG in the future. Patient also needs a AVR in the future. He has complicated access which is preventing for management in the past. Currently on dual antiplatelet therapy for his significant atherosclerotic disease. We have continued his home doses of dual antiplatelet therapy. Continued his statin therapy. -continue home plavix, aspirin and statin   DVT ppx: heparin Diet: Renal  CODE: FULL  Dispo: Anticipated discharge to Home in 1 days.   Lawerance Cruel, D.O.  Internal Medicine Resident, PGY-2 [name]  Pager: [phone] 9:15 AM, 12/13/2019   Please contact the on call pager after 5 pm and on weekends at [phone].

## 2019-12-13 NOTE — Progress Notes (Signed)
OT Cancellation Note  Patient Details Name: [name] MRN: [id number] DOB: 02-Oct-1965   Cancelled Treatment:    Reason Eval/Treat Not Completed: Pain limiting ability to participate;Other (comment). Attempted skilled OT treatment session. Pt. Politely declines secondary to pain.  Also reports he is going to HD soon and just got back to bed comfortably.  Will attempt back as able.   Daiva Huge Lorraine-COTA/L 12/13/2019, 11:32 AM

## 2019-12-13 NOTE — Progress Notes (Signed)
Subjective: Said tolerated 3-hour dialysis yesterday, continued knee discomfort, some diarrhea this a.m. asking for Imodium  Objective Vital signs in last 24 hours: Vitals:   12/12/19 1445 12/12/19 1939 12/13/19 0310 12/13/19 0826  BP: (!) 110/55 134/68 (!) 148/77 (!) 113/49  Pulse: 75 78 66 64  Resp: 17 17 16 17   Temp: 99.5 F (37.5 C) 99.6 F (37.6 C) 97.7 F (36.5 C) 98.9 F (37.2 C)  TempSrc: Oral Oral Oral Oral  SpO2: 100% 96% 100% 99%  Weight:      Height:       Weight change:   Physical Exam General:chronically ill appearing male in NAD Heart:RRR, +3/0 systolic murmur Lungs:CTAB anteriolaterally  Abdomen:soft, NTND, quadrant nephrostomy bag Extremities: Right knee swelling, no lower leg edema Dialysis Access: R FEMORAL TDC  OP Dialysis Orders: Center:[facility], MWF, EDW 116.5 kg, 2K, 2.25 calcium bath, right femoral PermCath, Sensipar 30 mg q. hemodialysis, Hectorol 4 mics q. dialysis, heparin 3700 units Mircera 150 MCG given last on 12/01/2019, Venofer 50 q. hemodialysis weekly  Problem/Plan: 1. Right knee spontaneous hemarthrosis status post I&D 12/09/2019 orthopedics following,  no growth so far, plan per orthopedics now off antibiotics 2. ESRD -HD on schedule MWF using right femoral PermCath which is close to end-stage Access, 3. Hyperkalemia-K7.5 admit.  5.4 this a.m., only had 3-hour dialysis yesterday follow-up potassium in a.m. //noted patient does take as outpatient Kayexalate on Saturday and Sundays, in hospital will use Lokelma if needed 4. Hypertension/volume -yesterday tolerated 2.25 L UF, 2kg above dry weight but bed weight .  BP under control 5. Anemia -Hgb 9.5 > 9.0 > 8.9, will continue ESA on schedule , next due on 13th Monday hold his weekly iron secondary to infected knee 6. Metabolic bone disease -09.2 corrected calcium phosphorus 4.1, use low calcium bath next dialysis hold  Hectorol, continue Sensipar, binders = calcium acetate decreased to one  AC was 2 7. Nutrition -renal diet   Ernest Haber, PA-C [facility] [phone] 12/13/2019,9:22 AM  LOS: 4 days   Labs: Basic Metabolic Panel: Recent Labs  Lab 12/11/19 0257 12/12/19 0343 12/13/19 0459  NA 137 134* 134*  K 4.6 5.6* 5.4*  CL 95* 91* 96*  CO2 28 27 25   GLUCOSE 102* 92 100*  BUN 56* 72* 61*  CREATININE 7.47* 9.01* 8.89*  CALCIUM 8.9 9.1 9.4  PHOS 3.8  --  4.1   Liver Function Tests: Recent Labs  Lab 12/11/19 0257 12/13/19 0459  ALBUMIN 2.7* 2.8*   No results for input(s): LIPASE, AMYLASE in the last 168 hours. No results for input(s): AMMONIA in the last 168 hours. CBC: Recent Labs  Lab 12/10/19 0336 12/11/19 0257 12/11/19 2045 12/12/19 0700 12/13/19 0459  WBC 6.9 6.4 7.5 5.9 5.9  HGB 9.5* 9.0* 9.3* 9.3* 8.9*  HCT 31.5* 29.6* 31.1* 29.6* 29.4*  MCV 97.2 96.4 96.3 95.8 96.4  PLT 132* 134* 168 152 148*   Cardiac Enzymes: No results for input(s): CKTOTAL, CKMB, CKMBINDEX, TROPONINI in the last 168 hours. CBG: No results for input(s): GLUCAP in the last 168 hours.  Studies/Results: No results found. Medications:  . acetaminophen  650 mg Oral Q8H  . aspirin  325 mg Oral Daily  . calcium acetate  2,668 mg Oral TID WC  . Chlorhexidine Gluconate Cloth  6 each Topical Q0600  . cinacalcet  30 mg Oral Q M,W,F-HD  . clopidogrel  75 mg Oral Daily  . [START ON 12/15/2019] darbepoetin (ARANESP) injection - DIALYSIS  150 mcg Intravenous Q Mon-HD  . doxercalciferol  4 mcg Intravenous Q M,W,F-HD  . escitalopram  5 mg Oral Daily  . famotidine  20 mg Oral Daily  . feeding supplement (NEPRO CARB STEADY)  237 mL Oral Daily  . heparin injection (subcutaneous)  5,000 Units Subcutaneous Q8H  . levothyroxine  137 mcg Oral QAC breakfast  . rosuvastatin  10 mg Oral Daily  . traZODone  100 mg Oral QHS  . zolpidem  10 mg Oral QHS

## 2019-12-13 NOTE — Progress Notes (Signed)
   12/13/19 2320  Hand-Off documentation  Handoff Given Given to shift RN/LPN  Report given to (Full Name) Jackson Latino, RN  Handoff Received Received from shift RN/LPN  Report received from (Full Name) Lynnell Fiumara, RN  Vitals  Temp 99.3 F (37.4 C)  Temp Source Oral  BP (!) 105/47  BP Location Left Arm  BP Method Automatic  Patient Position (if appropriate) Lying  Pulse Rate 68  Pulse Rate Source Monitor  Resp 18  Oxygen Therapy  SpO2 100 %  O2 Device Room Air  Pain Assessment  Pain Scale 0-10  Pain Score 0  Post-Hemodialysis Assessment  Rinseback Volume (mL) 250 mL  KECN 184 V  Dialyzer Clearance Clear  Duration of HD Treatment -hour(s) 3 hour(s)  Hemodialysis Intake (mL) 500 mL  UF Total -Machine (mL) 2801 mL  Net UF (mL) 2301 mL  Tolerated HD Treatment Yes  Post-Hemodialysis Comments tx achieved as expected. no complaints and  he is stable.  AVG/AVF Arterial Site Held (minutes) 0 minutes  AVG/AVF Venous Site Held (minutes) 0 minutes  Hemodialysis Catheter Right Femoral vein Double-lumen;Temporary  Placement Date/Time: 12/21/17 1936   Placed prior to admission: No  Time Out: Correct patient;Correct procedure;Correct site  Maximum sterile barrier precautions: Hand hygiene;Cap;Mask;Sterile gloves;Sterile gown;Large sterile sheet  Site Prep: Chlorh...  Site Condition No complications  Blue Lumen Status Heparin locked;Capped (Central line)  Red Lumen Status Heparin locked;Capped (Central line)  Catheter fill solution Other (Comment) (cathflo)  Catheter fill volume (Arterial) 2.9 cc  Catheter fill volume (Venous) 2.9  Dressing Type Occlusive  Dressing Status Clean;Dry;Intact  Antimicrobial disc in place? Yes  Drainage Description None  Post treatment catheter status Capped and Clamped

## 2019-12-14 DIAGNOSIS — I25110 Atherosclerotic heart disease of native coronary artery with unstable angina pectoris: Secondary | ICD-10-CM

## 2019-12-14 DIAGNOSIS — R531 Weakness: Secondary | ICD-10-CM

## 2019-12-14 DIAGNOSIS — I350 Nonrheumatic aortic (valve) stenosis: Secondary | ICD-10-CM

## 2019-12-14 LAB — AEROBIC/ANAEROBIC CULTURE W GRAM STAIN (SURGICAL/DEEP WOUND): Culture: NO GROWTH

## 2019-12-14 LAB — RENAL FUNCTION PANEL
Albumin: 2.8 g/dL — ABNORMAL LOW (ref 3.5–5.0)
Anion gap: 13 (ref 5–15)
BUN: 44 mg/dL — ABNORMAL HIGH (ref 6–20)
CO2: 24 mmol/L (ref 22–32)
Calcium: 9.3 mg/dL (ref 8.9–10.3)
Chloride: 98 mmol/L (ref 98–111)
Creatinine, Ser: 7.12 mg/dL — ABNORMAL HIGH (ref 0.61–1.24)
GFR, Estimated: 8 mL/min — ABNORMAL LOW (ref >60)
Glucose, Bld: 118 mg/dL — ABNORMAL HIGH (ref 70–99)
Phosphorus: 4.2 mg/dL (ref 2.5–4.6)
Potassium: 4.6 mmol/L (ref 3.5–5.1)
Sodium: 135 mmol/L (ref 135–145)

## 2019-12-14 LAB — CULTURE, BLOOD (ROUTINE X 2)
Culture: NO GROWTH
Culture: NO GROWTH
Special Requests: ADEQUATE
Special Requests: ADEQUATE

## 2019-12-14 MED ORDER — CHLORHEXIDINE GLUCONATE CLOTH 2 % EX PADS
6.0000 | MEDICATED_PAD | Freq: Every day | CUTANEOUS | Status: DC
  Administered 2019-12-14 – 2019-12-18 (×4): 6 via TOPICAL

## 2019-12-14 MED ORDER — PSYLLIUM 95 % PO PACK
1.0000 | PACK | Freq: Every day | ORAL | Status: DC | PRN
  Filled 2019-12-14: qty 1

## 2019-12-14 NOTE — Progress Notes (Signed)
HD#5 Subjective:  Overnight Events: none   Patient states that he still having difficulty sleeping despite being on home medications for insomnia.  He continues to have some pain in his right that improved with increased pain medicine as of yesterday.  Swelling appears to be stable.  I counseled the patient regarding the need for conservative management and that his pain will likely subside in the coming days.  Patient admits to understanding.  Objective:  Vital signs in last 24 hours: Vitals:   12/13/19 2300 12/13/19 2320 12/14/19 0339 12/14/19 0800  BP: (!) 116/44 (!) 105/47 96/62 (!) 105/56  Pulse: 71 68 67 72  Resp: 18 18 14 18   Temp: 99.3 F (37.4 C) 99.3 F (37.4 C) 98.4 F (36.9 C) 98.6 F (37 C)  TempSrc: Oral Oral Oral Oral  SpO2: 100% 100% 98% 100%  Weight:      Height:       Supplemental O2: Room Air SpO2: 100 %   Physical Exam:  Physical Exam Constitutional:      Appearance: Normal appearance.  HENT:     Head: Normocephalic and atraumatic.  Eyes:     Extraocular Movements: Extraocular movements intact.  Cardiovascular:     Rate and Rhythm: Normal rate.     Pulses: Normal pulses.     Heart sounds: Murmur heard.    Pulmonary:     Effort: Pulmonary effort is normal.     Breath sounds: Normal breath sounds.  Abdominal:     General: Bowel sounds are normal.     Palpations: Abdomen is soft.     Tenderness: There is no abdominal tenderness.  Musculoskeletal:        General: Swelling (right knee, stable) and tenderness (right knee) present.     Cervical back: Normal range of motion.     Right lower leg: No edema.     Left lower leg: No edema.  Skin:    General: Skin is warm and dry.  Neurological:     Mental Status: He is alert and oriented to person, place, and time. Mental status is at baseline.     Motor: Weakness (generalized weakness) present.  Psychiatric:        Mood and Affect: Mood normal.     Filed Weights   12/10/19 1215 12/12/19  0815 12/12/19 1129  Weight: 117.6 kg 120.4 kg 118 kg     Intake/Output Summary (Last 24 hours) at 12/14/2019 1124 Last data filed at 12/14/2019 0900 Gross per 24 hour  Intake 720 ml  Output 2301 ml  Net -1581 ml   Net IO Since Admission: -5,694 mL [12/14/19 1124]  Pertinent Labs: CBC Latest Ref Rng & Units 12/13/2019 12/12/2019 12/11/2019  WBC 4.0 - 10.5 K/uL 5.9 5.9 7.5  Hemoglobin 13.0 - 17.0 g/dL 8.9(L) 9.3(L) 9.3(L)  Hematocrit 39.0 - 52.0 % 29.4(L) 29.6(L) 31.1(L)  Platelets 150 - 400 K/uL 148(L) 152 168    CMP Latest Ref Rng & Units 12/14/2019 12/13/2019 12/12/2019  Glucose 70 - 99 mg/dL 118(H) 100(H) 92  BUN 6 - 20 mg/dL 44(H) 61(H) 72(H)  Creatinine 0.61 - 1.24 mg/dL 7.12(H) 8.89(H) 9.01(H)  Sodium 135 - 145 mmol/L 135 134(L) 134(L)  Potassium 3.5 - 5.1 mmol/L 4.6 5.4(H) 5.6(H)  Chloride 98 - 111 mmol/L 98 96(L) 91(L)  CO2 22 - 32 mmol/L 24 25 27   Calcium 8.9 - 10.3 mg/dL 9.3 9.4 9.1  Total Protein 6.5 - 8.1 g/dL - - -  Total Bilirubin 0.3 -  1.2 mg/dL - - -  Alkaline Phos 38 - 126 U/L - - -  AST 15 - 41 U/L - - -  ALT 0 - 44 U/L - - -    Imaging: No results found.  Assessment/Plan:   Active Problems:   ESRD on dialysis ([place])   Septic arthritis ([facility])   Hemarthrosis, right knee   Patient Summary: [name] is a 54 y/o gentleman with history of ESRD on HD, HTN, nonhealing surgical wounds from prior AV grafts, recent history of septic arthritis in left knee, severe AS, CAD,spina bifida with Myelomeningocele who presents with progressive right knee pain that began 4 days agoadmitted for concern of septic arthritis, on further workup suspect spontaneous hemarthrosis.  Right knee effusion like due tospontaneoushemarthrosis Patient continues to have pain that is improved some with increased pain medication.  I counseled the patient on conservative management of his pain and swelling of his knee.  Physical therapy has recommended home health PT.   Continue to manage his pain and perform physical therapy in the hospital with goal to discharge patient home tomorrow after dialysis.  -Ice packs as needed - Oxycodone 10 mg q4 PRN for pain -IV Dilaudid 1 mg q2prnbreakthroughpain, phenergan prn nausea  ESRD on HD -appreciate nephrology consult for inpatient HD management.  - monitor renal function - MWF dialysis   Chronic anxiety -continue hom lexapro and ativan   CAD with unstable angina/3-vessel disease  Severe Aortic stenosis - Continued his statin therapy. -continue home plavix, aspirin and statin   DVT ppx: heparin Diet: Renal  CODE: FULL  Dispo: Anticipated discharge to [place] with home health PT Lawerance Cruel, D.O.  Internal Medicine Resident, PGY-2 [name]  Pager: [phone] 11:24 AM, 12/14/2019   Please contact the on call pager after 5 pm and on weekends at [phone].

## 2019-12-14 NOTE — Hospital Course (Addendum)
54 year old gentleman with past medical history of ESRD, hypertension, severe aortic stenosis, spina bifida, multiple vascular access issues with current access by tunneled dialysis catheter to right thigh and recent history of septic arthritis of his left knee. Presenting with 4-day history of swelling and painful range of motion with the right knee that worsened after hemodialysis. Found to be febrile, with right knee pain and effusion in the ED and started on broad spectrum antibiotics and orthopedic evaluation due recent history of septic arthritis of the left knee. Synovial fluid bloody in appearance with 8000 white blood cell count, no crystals, and negative gram stain. Cultures negative throughout admission. Effusion likely due to hemarthrosis rather than septic arthritis. Discontinued on antibiotics on day 2, coagulation studies negative. Patient developed swelling at the back of the right knee US showing no DVT or cystic structures. Anticipate swelling will gradually improve with conservative care and home PT.

## 2019-12-14 NOTE — Plan of Care (Signed)
  Problem: Education: Goal: Knowledge of General Education information will improve Description: Including pain rating scale, medication(s)/side effects and non-pharmacologic comfort measures Outcome: Progressing   Problem: Health Behavior/Discharge Planning: Goal: Ability to manage health-related needs will improve Outcome: Progressing   Problem: Activity: Goal: Risk for activity intolerance will decrease Outcome: Progressing   Problem: Nutrition: Goal: Adequate nutrition will be maintained Outcome: Progressing   Problem: Coping: Goal: Level of anxiety will decrease Outcome: Progressing   Problem: Safety: Goal: Ability to remain free from injury will improve Outcome: Progressing   Problem: Pain Managment: Goal: General experience of comfort will improve Outcome: Not Progressing

## 2019-12-14 NOTE — Progress Notes (Signed)
[facility] Progress Note   Subjective:   Patient seen and examined at bedside.  Reports knee pain, 8/10, due for pain meds.  Admits to nausea and diarrhea with episode of stool incontinence last night.  Denies abdominal pain, diarrhea, SOB, CP and edema.  Tolerated HD well yesterday.   Objective Vitals:   12/13/19 2300 12/13/19 2320 12/14/19 0339 12/14/19 0800  BP: (!) 116/44 (!) 105/47 96/62 (!) 105/56  Pulse: 71 68 67 72  Resp: 18 18 14 18   Temp: 99.3 F (37.4 C) 99.3 F (37.4 C) 98.4 F (36.9 C) 98.6 F (37 C)  TempSrc: Oral Oral Oral Oral  SpO2: 100% 100% 98% 100%  Weight:      Height:       Physical Exam General:chronically ill appearing male in NAD Heart:RRR, +1/4 systolic murmur Lungs:CTAB, nml WOB Abdomen:soft, NTND Extremities: no LE edema Dialysis Access: R femoral [facility]   Filed Weights   12/10/19 1215 12/12/19 0815 12/12/19 1129  Weight: 117.6 kg 120.4 kg 118 kg    Intake/Output Summary (Last 24 hours) at 12/14/2019 1040 Last data filed at 12/14/2019 0900 Gross per 24 hour  Intake 720 ml  Output 2301 ml  Net -1581 ml    Additional Objective Labs: Basic Metabolic Panel: Recent Labs  Lab 12/11/19 0257 12/12/19 0343 12/13/19 0459 12/14/19 0306  NA 137 134* 134* 135  K 4.6 5.6* 5.4* 4.6  CL 95* 91* 96* 98  CO2 28 27 25 24   GLUCOSE 102* 92 100* 118*  BUN 56* 72* 61* 44*  CREATININE 7.47* 9.01* 8.89* 7.12*  CALCIUM 8.9 9.1 9.4 9.3  PHOS 3.8  --  4.1 4.2   Liver Function Tests: Recent Labs  Lab 12/11/19 0257 12/13/19 0459 12/14/19 0306  ALBUMIN 2.7* 2.8* 2.8*   CBC: Recent Labs  Lab 12/10/19 0336 12/11/19 0257 12/11/19 2045 12/12/19 0700 12/13/19 0459  WBC 6.9 6.4 7.5 5.9 5.9  HGB 9.5* 9.0* 9.3* 9.3* 8.9*  HCT 31.5* 29.6* 31.1* 29.6* 29.4*  MCV 97.2 96.4 96.3 95.8 96.4  PLT 132* 134* 168 152 148*   Blood Culture    Component Value Date/Time   SDES SYNOVIAL RIGHT KNEE 12/09/2019 1617   SPECREQUEST FLUID ON SWABS  12/09/2019 1617   CULT  12/09/2019 1617    NO GROWTH 4 DAYS NO ANAEROBES ISOLATED; CULTURE IN PROGRESS FOR 5 DAYS Performed at [facility], [place] [address] [place], [place] [address]    REPTSTATUS PENDING 12/09/2019 1617    Medications:  . acetaminophen  650 mg Oral Q8H  . calcium acetate  667 mg Oral TID WC  . Chlorhexidine Gluconate Cloth  6 each Topical Q0600  . cinacalcet  30 mg Oral Q M,W,F-HD  . clopidogrel  75 mg Oral Daily  . [START ON 12/15/2019] darbepoetin (ARANESP) injection - DIALYSIS  150 mcg Intravenous Q Mon-HD  . escitalopram  5 mg Oral Daily  . famotidine  20 mg Oral Daily  . feeding supplement (NEPRO CARB STEADY)  237 mL Oral Daily  . heparin injection (subcutaneous)  5,000 Units Subcutaneous Q8H  . levothyroxine  137 mcg Oral QAC breakfast  . rosuvastatin  10 mg Oral Daily  . traZODone  100 mg Oral QHS  . zolpidem  10 mg Oral QHS    Dialysis Orders: [facility], MWF, EDW116.5 kg, 2K, 2.25 calcium bath,right femoralPermCath, Sensipar 30 mg q. hemodialysis, Hectorol 4 mics q. dialysis, heparin 3700 units Mircera 150 MCG given last on 12/01/2019, Venofer  50 q. hemodialysis weekly  Assessment/Plan: 1. Right knee effusion - spontaneous hemarthrosis vs septic arthritis s/p I&D on 12/7 [facility] & synovial fluid with NGTD. orthopedics following, IV Vanc/Zosyn. Pain management/ABX per ortho/admit.   2. ESRD -HD MWF.  Extra truncated HD yesterday due to elevated K. HD tomorrow per regular schedule.  3. Hyperkalemia- improved K 4.6 today. Follow daily, noted patient does take as outpatient Kayexalate on Saturday and Sundays, in hospital will use Lokelma if needed over weekend. 4. Hypertension/volume -BP ok. Close to dry weight, does not appear volume overloaded.  UF as tolerated.  5. Anemia -Hgb 8.9 today, continue ESA on schedule - next due 12/13, aranesp 171mcg ordered, hold his weekly iron secondary to infected knee 6. Metabolic bone disease - Ca and phos in  goal. continue binders and VDRA. 7. Nutrition -renal diet w/fluid restrictions.   Jen Mow, PA-C [facility] 12/14/2019,10:40 AM  LOS: 5 days

## 2019-12-14 NOTE — Plan of Care (Signed)
  Problem: Activity: Goal: Risk for activity intolerance will decrease Outcome: Progressing   Problem: Nutrition: Goal: Adequate nutrition will be maintained Outcome: Progressing   Problem: Pain Managment: Goal: General experience of comfort will improve Outcome: Progressing   Problem: Safety: Goal: Ability to remain free from injury will improve Outcome: Progressing   Problem: Skin Integrity: Goal: Risk for impaired skin integrity will decrease Outcome: Progressing   

## 2019-12-15 ENCOUNTER — Inpatient Hospital Stay (HOSPITAL_COMMUNITY): Payer: Medicare Other

## 2019-12-15 ENCOUNTER — Encounter (HOSPITAL_COMMUNITY): Payer: Medicare Other

## 2019-12-15 DIAGNOSIS — L538 Other specified erythematous conditions: Secondary | ICD-10-CM

## 2019-12-15 DIAGNOSIS — M79609 Pain in unspecified limb: Secondary | ICD-10-CM

## 2019-12-15 DIAGNOSIS — M7989 Other specified soft tissue disorders: Secondary | ICD-10-CM

## 2019-12-15 MED ORDER — HYDROMORPHONE HCL 1 MG/ML IJ SOLN
1.5000 mg | INTRAMUSCULAR | Status: DC | PRN
  Administered 2019-12-15 – 2019-12-18 (×12): 1.5 mg via INTRAVENOUS
  Filled 2019-12-15 (×12): qty 1.5

## 2019-12-15 NOTE — Progress Notes (Addendum)
Physical Therapy Treatment Patient Details Name: [name] MRN: [id number] DOB: 16-Mar-1965 Today's Date: 12/15/2019    History of Present Illness 54 y/o gentleman with history of ESRD on HD, HTN, nonhealing surgical wounds on LLE from prior AV grafts, recent history of septic arthritis in left knee, severe AS, CAD, spina bifida with  Myelomeningocele, L great toe amputation, nephrostomy, anxiety who presents with progressive right knee pain that began 4 days ago. s/p I&D RLE due to spontaneous hemarthrosis vs septic arthosis.    PT Comments    Pt supine on arrival, agreeable to bed-level session only due to fatigue from recent ambulation to/from bathroom. Pt performed supine BLE AROM therapeutic exercises with good tolerance, limited due to R knee pain but pt put forth good effort in attempting all exercises. Pt given HEP handout  (link: [email or web address] Access Code: [email or web address]) and encouraged to perform TID as able. Will plan to assess OOB mobility/progress gait distance next session, pt with greatly decreased activity tolerance and poor pain tolerance. Encouraged use of ice on/off for pain relief and heels floated for pressure relief at end of session. Pt continues to benefit from PT services to progress toward functional mobility goals. D/C recs below, updated per discussion with Supervising PT Alayna A, as pt with poor progress towards goals and remains limited due to continued severe RLE pain, pt may benefit from short term skilled rehab facility prior to DC home if agreeable.  Follow Up Recommendations  Home health PT;Supervision for mobility/OOB;SNF     Equipment Recommendations  None recommended by PT    Recommendations for Other Services       Precautions / Restrictions Precautions Precautions: Fall Restrictions Weight Bearing Restrictions: Yes RLE Weight Bearing: Weight bearing as tolerated Other Position/Activity Restrictions: recent R knee doppler, negative  for DVT per chart review    Mobility  Bed Mobility Overal bed mobility: Needs Assistance Bed Mobility:  (posterior supine scoot toward HOB)           General bed mobility comments: modA and BUE pulling on bed rails, pt with minimal ability to assist with BLE due to R knee pain and can't tolerate HOB more flat than 10 deg. Pt refused EOB/OOB mobility this session.  Transfers                    Ambulation/Gait                 Stairs             Wheelchair Mobility    Modified Rankin (Stroke Patients Only)       Balance                                            Cognition Arousal/Alertness: Awake/alert Behavior During Therapy: WFL for tasks assessed/performed Overall Cognitive Status: Within Functional Limits for tasks assessed                                 General Comments: drowsy but participatory in bed-level session      Exercises General Exercises - Lower Extremity Ankle Circles/Pumps: AROM;Strengthening;10 reps;Both;Supine Quad Sets: AROM;Strengthening;Both;10 reps;Supine Gluteal Sets: AROM;Strengthening;Both;10 reps;Supine Short Arc Quad: AROM;Strengthening;Both;10 reps;Supine Heel Slides: AROM;Strengthening;Both;10 reps;Supine Hip ABduction/ADduction: AROM;Strengthening;Both;10 reps;Supine Straight Leg Raises: AROM;Strengthening;Both;10 reps;Supine (5 only on RLE due to severe knee  pain) Other Exercises Other Exercises: pt given HEP handout, see comments above for link.    General Comments General comments (skin integrity, edema, etc.): pt heels floated at end of session; SpO2 100% on RA and HR reading 44 however poor pleth reading      Pertinent Vitals/Pain Pain Assessment: Faces Faces Pain Scale: Hurts whole lot Pain Location: R knee Pain Descriptors / Indicators: Grimacing;Guarding;Sore Pain Intervention(s): Monitored during session;Patient requesting pain meds-RN notified;Repositioned (pt  defers ice at this time)   Vitals:   12/15/19 0829  BP: (!) 95/45  Pulse: 69  Resp: 16  Temp: 98.1 F (36.7 C)  SpO2: 98%    Home Living                      Prior Function            PT Goals (current goals can now be found in the care plan section) Acute Rehab PT Goals Patient Stated Goal: pain control, go home PT Goal Formulation: With patient Time For Goal Achievement: 12/24/19 Potential to Achieve Goals: Fair Progress towards PT goals: Progressing toward goals (slow progress, pain limited this session)    Frequency    Min 5X/week      PT Plan Discharge plan needs to be updated    Co-evaluation              AM-PAC PT "6 Clicks" Mobility   Outcome Measure  Help needed turning from your back to your side while in a flat bed without using bedrails?: A Little Help needed moving from lying on your back to sitting on the side of a flat bed without using bedrails?: A Little Help needed moving to and from a bed to a chair (including a wheelchair)?: A Little Help needed standing up from a chair using your arms (e.g., wheelchair or bedside chair)?: A Little Help needed to walk in hospital room?: A Little Help needed climbing 3-5 steps with a railing? : A Lot 6 Click Score: 17    End of Session   Activity Tolerance: Patient limited by pain;Patient limited by fatigue Patient left: in bed;with call bell/phone within reach;with bed alarm set (heels floated, pt declined ice pack for R knee) Nurse Communication: Mobility status;Patient requests pain meds PT Visit Diagnosis: Other abnormalities of gait and mobility (R26.89);Muscle weakness (generalized) (M62.81)     Time: 4818-5631 PT Time Calculation (min) (ACUTE ONLY): 13 min  Charges:  $Therapeutic Exercise: 8-22 mins                     Winnifred Dufford P., PTA Acute Rehabilitation Services Pager: [phone] Office: [place] 12/15/2019, 1:30 PM

## 2019-12-15 NOTE — Progress Notes (Signed)
Contacted Dialysis in regards to the treatment scheduled for him today- they will call back when ready for the patient

## 2019-12-15 NOTE — Progress Notes (Signed)
VASCULAR LAB    Right lower extremity venous duplex has been performed.  See CV proc for preliminary results.   Charese Abundis, RVT 12/15/2019, 11:46 AM

## 2019-12-15 NOTE — Plan of Care (Signed)
  Problem: Pain Managment: Goal: General experience of comfort will improve Outcome: Progressing   Problem: Safety: Goal: Ability to remain free from injury will improve Outcome: Progressing   Problem: Skin Integrity: Goal: Risk for impaired skin integrity will decrease Outcome: Progressing   

## 2019-12-15 NOTE — Progress Notes (Signed)
[facility] Progress Note   Subjective:   Patient seen and examined at bedside.  Reports new pain in different part of his knee today.  Given increased dose of dilaudid during our conversation and noted some improvement.  Not sleeping well do to be uncomfortable.  Denies CP, SOB, n/v/d, weakness, dizziness and fatigue.  No additional complaints.    Objective Vitals:   12/14/19 1322 12/14/19 1930 12/15/19 0339 12/15/19 0829  BP: 106/63  (!) 94/55 (!) 95/45  Pulse: 78  65 69  Resp: 17  14 16   Temp: 98.5 F (36.9 C) (P) 97.9 F (36.6 C) 98.1 F (36.7 C) 98.1 F (36.7 C)  TempSrc: Oral (P) Oral Oral Oral  SpO2: 99%  98% 98%  Weight:      Height:       Physical Exam General:chronically ill appearing male in NAD Heart:RRR, +6/7 systolic murmur Lungs:CTAB. nml WOB Abdomen:soft, NTND Extremities:no LE edema, L thigh and R knee dressed Dialysis Access: R femoral [facility]   Filed Weights   12/10/19 1215 12/12/19 0815 12/12/19 1129  Weight: 117.6 kg 120.4 kg 118 kg    Intake/Output Summary (Last 24 hours) at 12/15/2019 1332 Last data filed at 12/15/2019 0500 Gross per 24 hour  Intake --  Output 500 ml  Net -500 ml    Additional Objective Labs: Basic Metabolic Panel: Recent Labs  Lab 12/11/19 0257 12/12/19 0343 12/13/19 0459 12/14/19 0306  NA 137 134* 134* 135  K 4.6 5.6* 5.4* 4.6  CL 95* 91* 96* 98  CO2 28 27 25 24   GLUCOSE 102* 92 100* 118*  BUN 56* 72* 61* 44*  CREATININE 7.47* 9.01* 8.89* 7.12*  CALCIUM 8.9 9.1 9.4 9.3  PHOS 3.8  --  4.1 4.2   Liver Function Tests: Recent Labs  Lab 12/11/19 0257 12/13/19 0459 12/14/19 0306  ALBUMIN 2.7* 2.8* 2.8*   CBC: Recent Labs  Lab 12/10/19 0336 12/11/19 0257 12/11/19 2045 12/12/19 0700 12/13/19 0459  WBC 6.9 6.4 7.5 5.9 5.9  HGB 9.5* 9.0* 9.3* 9.3* 8.9*  HCT 31.5* 29.6* 31.1* 29.6* 29.4*  MCV 97.2 96.4 96.3 95.8 96.4  PLT 132* 134* 168 152 148*   Blood Culture    Component Value Date/Time    SDES SYNOVIAL RIGHT KNEE 12/09/2019 1617   SPECREQUEST FLUID ON SWABS 12/09/2019 1617   CULT  12/09/2019 1617    No growth aerobically or anaerobically. Performed at [facility], [place] [address], [place], [place] [address]    REPTSTATUS 12/14/2019 FINAL 12/09/2019 1617   Studies/Results: VAS [place] LOWER EXTREMITY VENOUS (DVT)  Result Date: 12/15/2019  Lower Venous DVT Study Indications: Pain, Swelling, and Erythema.  Risk Factors: Recent I&D of left. Limitations: Bandages and Dialysis catheter in left thigh. Edema. Comparison Study: No prior study on file Performing Technologist: Sharion Dove RVS  Examination Guidelines: A complete evaluation includes B-mode imaging, spectral Doppler, color Doppler, and power Doppler as needed of all accessible portions of each vessel. Bilateral testing is considered an integral part of a complete examination. Limited examinations for reoccurring indications may be performed as noted. The reflux portion of the exam is performed with the patient in reverse Trendelenburg.  +---------+---------------+---------+-----------+----------+-------------------+ RIGHT    CompressibilityPhasicitySpontaneityPropertiesThrombus Aging      +---------+---------------+---------+-----------+----------+-------------------+ CFV  patent by color     +---------+---------------+---------+-----------+----------+-------------------+ SFJ                                                   patent by color     +---------+---------------+---------+-----------+----------+-------------------+ FV Prox                                               patent by color     +---------+---------------+---------+-----------+----------+-------------------+ FV Mid                                                patent by color     +---------+---------------+---------+-----------+----------+-------------------+ FV Distal                                              patent by color     +---------+---------------+---------+-----------+----------+-------------------+ PFV                                                   Not well visualized +---------+---------------+---------+-----------+----------+-------------------+ POP      Full           Yes      Yes                                      +---------+---------------+---------+-----------+----------+-------------------+ PTV      Full                                                             +---------+---------------+---------+-----------+----------+-------------------+ PERO                                                  not all segments                                                          well visualized     +---------+---------------+---------+-----------+----------+-------------------+ GSV      Full                                                             +---------+---------------+---------+-----------+----------+-------------------+  Left Technical Findings: Left leg not evaluated.   Summary: RIGHT: - There is no evidence of deep vein thrombosis in the lower extremity. However, portions of this examination were limited- see technologist comments above.  - No cystic structure found in the popliteal fossa.   *See table(s) above for measurements and observations.    Preliminary     Medications:  . acetaminophen  650 mg Oral Q8H  . calcium acetate  667 mg Oral TID WC  . Chlorhexidine Gluconate Cloth  6 each Topical Q0600  . cinacalcet  30 mg Oral Q M,W,F-HD  . clopidogrel  75 mg Oral Daily  . darbepoetin (ARANESP) injection - DIALYSIS  150 mcg Intravenous Q Mon-HD  . escitalopram  5 mg Oral Daily  . famotidine  20 mg Oral Daily  . feeding supplement (NEPRO CARB STEADY)  237 mL Oral Daily  . heparin injection (subcutaneous)  5,000 Units Subcutaneous Q8H  . levothyroxine  137 mcg Oral QAC breakfast  . rosuvastatin  10  mg Oral Daily  . traZODone  100 mg Oral QHS  . zolpidem  10 mg Oral QHS    Dialysis Orders: [facility], MWF, EDW116.5 kg, 2K, 2.25 calcium bath,right femoralPermCath, Sensipar 30 mg q. hemodialysis, Hectorol 4 mics q. dialysis, heparin 3700 units Mircera 150 MCG given last on 12/01/2019, Venofer 50 q. hemodialysis weekly  Assessment/Plan: 1. Right kneeeffusion -spontaneous hemarthrosis vsseptic arthritis s/p I&D on 12/7 [facility] & synovial fluid with NGTD.orthopedics following, IV Vanc/Zosyn. New pain around knee today - Vas [place] completed with prelim showing no DVT or baker cyst.  Pain management/ABX per ortho/admit.   2. ESRD -HD MWF.  HD today per regular schedule. 3. Hyperkalemia- last K 4.6 today. Follow daily,noted patient does take as outpatient Kayexalate on Saturday and Sundays, in hospital will use Lokelma if neededover weekend. Checking labs pre HD. 4. Hypertension/volume -BP ok. Close to dry weight, does not appear volume overloaded.  UF as tolerated.  5. Anemia -last Hgb8.9,continue ESA on schedule- next due 12/13, aranesp 139mcg ordered,hold his weekly iron secondary to infected knee 6. Metabolic bone disease -Ca and phos in goal.continue binders andVDRA. 7. Nutrition -renal dietw/fluid restrictions.   Jen Mow, PA-C [facility] 12/15/2019,1:33 PM  LOS: 6 days

## 2019-12-15 NOTE — Plan of Care (Signed)

## 2019-12-15 NOTE — Progress Notes (Signed)
Patient education given to the patient after the patient appearing upset because the nurse was not bringing his dilaudid every time that he asked for pain medication.  The patient appeared to be sleeping when taking the medication to his room and had to be awaken.  He questioned the way that his pain medication are being given- night shift v/s day shift.  I explained to him that po pain medicatons are to be given first if the time is correct.  I spoke with Manuela Schwartz, RN AD and she attempted to explain the medication orders and hospital policy regarding pain medications.

## 2019-12-15 NOTE — Progress Notes (Signed)
Patient has a nose bleed- MD at the bedside for physical assessment.  The patient request his phenergan IV medication for nausea now that he is nauseated from the blood draining down his nose.  Appears to have a small amount of bleeding from the left nasal

## 2019-12-15 NOTE — Progress Notes (Addendum)
HD#6 Subjective:  Overnight Events: none   Patient states that he still having pain. States the pain is not just in his knee but a few inches above and below the knee and behind it. States he is in so much pain that it is making him sick to his stomach. He has not had anymore diarrhea. He has not had a good sleep for the last 3 days.  He was reassured that his knee pain will improve over time.   States he is having new right calf pain that was not there when he came in. States his pain meds have not controlled his pain and would like his dilaudid increased.   Objective:  Vital signs in last 24 hours: Vitals:   12/14/19 0800 12/14/19 1322 12/14/19 1930 12/15/19 0339  BP: (!) 105/56 106/63  (!) 94/55  Pulse: 72 78  65  Resp: 18 17  14   Temp: 98.6 F (37 C) 98.5 F (36.9 C) (P) 97.9 F (36.6 C) 98.1 F (36.7 C)  TempSrc: Oral Oral (P) Oral Oral  SpO2: 100% 99%  98%  Weight:      Height:       Supplemental O2: Room Air SpO2: 98 %   Physical Exam:  Physical Exam Constitutional:      Appearance: Normal appearance.  HENT:     Head: Normocephalic and atraumatic.  Eyes:     Extraocular Movements: Extraocular movements intact.  Cardiovascular:     Rate and Rhythm: Normal rate.     Pulses: Normal pulses.     Heart sounds: Murmur heard.    Pulmonary:     Effort: Pulmonary effort is normal.     Breath sounds: Normal breath sounds.  Abdominal:     General: Bowel sounds are normal.     Palpations: Abdomen is soft.     Tenderness: There is no abdominal tenderness.  Musculoskeletal:        General: Swelling (right knee, stable) and tenderness (right knee) present.     Cervical back: Normal range of motion.     Right lower leg: No edema.     Left lower leg: No edema.     Comments: Small area of erythema of lateral calf   Skin:    General: Skin is warm and dry.  Neurological:     Mental Status: He is alert and oriented to person, place, and time. Mental status is at  baseline.     Motor: Weakness (generalized weakness) present.  Psychiatric:        Mood and Affect: Mood normal.     Filed Weights   12/10/19 1215 12/12/19 0815 12/12/19 1129  Weight: 117.6 kg 120.4 kg 118 kg     Intake/Output Summary (Last 24 hours) at 12/15/2019 0627 Last data filed at 12/14/2019 2300 Gross per 24 hour  Intake 480 ml  Output 0 ml  Net 480 ml   Net IO Since Admission: -5,574 mL [12/15/19 0627]  Pertinent Labs: CBC Latest Ref Rng & Units 12/13/2019 12/12/2019 12/11/2019  WBC 4.0 - 10.5 K/uL 5.9 5.9 7.5  Hemoglobin 13.0 - 17.0 g/dL 8.9(L) 9.3(L) 9.3(L)  Hematocrit 39.0 - 52.0 % 29.4(L) 29.6(L) 31.1(L)  Platelets 150 - 400 K/uL 148(L) 152 168    CMP Latest Ref Rng & Units 12/14/2019 12/13/2019 12/12/2019  Glucose 70 - 99 mg/dL 118(H) 100(H) 92  BUN 6 - 20 mg/dL 44(H) 61(H) 72(H)  Creatinine 0.61 - 1.24 mg/dL 7.12(H) 8.89(H) 9.01(H)  Sodium 135 -  145 mmol/L 135 134(L) 134(L)  Potassium 3.5 - 5.1 mmol/L 4.6 5.4(H) 5.6(H)  Chloride 98 - 111 mmol/L 98 96(L) 91(L)  CO2 22 - 32 mmol/L 24 25 27   Calcium 8.9 - 10.3 mg/dL 9.3 9.4 9.1  Total Protein 6.5 - 8.1 g/dL - - -  Total Bilirubin 0.3 - 1.2 mg/dL - - -  Alkaline Phos 38 - 126 U/L - - -  AST 15 - 41 U/L - - -  ALT 0 - 44 U/L - - -    Imaging: No results found.  Assessment/Plan:   Principal Problem:   Hemarthrosis, right knee Active Problems:   ESRD on dialysis [facility])   Patient Summary: [name] is a 54 y/o gentleman with history of ESRD on HD, HTN, nonhealing surgical wounds from prior AV grafts, recent history of septic arthritis in left knee, severe AS, CAD,spina bifida with Myelomeningocele who presents with progressive right knee pain that began 4 days agoadmitted for concern of septic arthritis, on further workup suspect spontaneous hemarthrosis.  Right knee effusion like due tospontaneoushemarthrosis Continues to have significant pain, now extending to behind the knee and calf.  Concerning for bakers cyst vs DVT. Will check RLE U/S to rule out DVT. - follow up RLE [place] - Continue to work with PT while in hospital, plan for HHPT - Oxycodone 10 mg q4 PRN for pain - Increased dilaudid to 1.5 mg q2prnbreakthroughpain, phenergan prn nausea  ESRD on HD -HD today -appreciate nephrology consult for inpatient HD management.  - monitor renal function - MWF dialysis   Chronic anxiety -continue hom lexapro and ativan   CAD with unstable angina/3-vessel disease  Severe Aortic stenosis -Continued statin and plavis  DVT ppx: heparin Diet: Renal  CODE: FULL  Dispo: Anticipated discharge to Home today with home health PT.  [name] 6:27 AM, 12/15/2019  Pager [phone]  Please contact the on call pager after 5 pm and on weekends at [phone].

## 2019-12-16 MED ORDER — HEPARIN SODIUM (PORCINE) 1000 UNIT/ML DIALYSIS: 3000.0000 [IU] | INTRAMUSCULAR | Status: DC | PRN

## 2019-12-16 MED ORDER — LIDOCAINE HCL (PF) 1 % IJ SOLN: 5.0000 mL | INTRAMUSCULAR | Status: DC | PRN

## 2019-12-16 MED ORDER — SODIUM CHLORIDE 0.9 % IV SOLN: 100.0000 mL | INTRAVENOUS | Status: DC | PRN

## 2019-12-16 MED ORDER — DARBEPOETIN ALFA 150 MCG/0.3ML IJ SOSY
150.0000 ug | PREFILLED_SYRINGE | INTRAMUSCULAR | Status: DC
  Administered 2019-12-17: 150 ug via INTRAVENOUS

## 2019-12-16 MED ORDER — PENTAFLUOROPROP-TETRAFLUOROETH EX AERO: 1.0000 "application " | INHALATION_SPRAY | CUTANEOUS | Status: DC | PRN

## 2019-12-16 MED ORDER — ALTEPLASE 2 MG IJ SOLR
INTRAMUSCULAR | Status: AC
  Administered 2019-12-16: 2 mg
  Filled 2019-12-16: qty 4

## 2019-12-16 MED ORDER — PROMETHAZINE HCL 25 MG/ML IJ SOLN
INTRAMUSCULAR | Status: AC
  Administered 2019-12-16: 25 mg
  Filled 2019-12-16: qty 1

## 2019-12-16 MED ORDER — HEPARIN SODIUM (PORCINE) 1000 UNIT/ML DIALYSIS: 1000.0000 [IU] | INTRAMUSCULAR | Status: DC | PRN

## 2019-12-16 MED ORDER — ALTEPLASE 2 MG IJ SOLR: 2.0000 mg | Freq: Once | INTRAMUSCULAR | Status: DC | PRN

## 2019-12-16 MED ORDER — LIDOCAINE-PRILOCAINE 2.5-2.5 % EX CREA: 1.0000 "application " | TOPICAL_CREAM | CUTANEOUS | Status: DC | PRN

## 2019-12-16 MED ORDER — FUROSEMIDE 40 MG PO TABS
80.0000 mg | ORAL_TABLET | Freq: Every day | ORAL | Status: DC
  Administered 2019-12-16 – 2019-12-19 (×3): 80 mg via ORAL
  Filled 2019-12-16 (×3): qty 2

## 2019-12-16 MED ORDER — SALINE SPRAY 0.65 % NA SOLN
1.0000 | NASAL | Status: DC | PRN
  Filled 2019-12-16: qty 44

## 2019-12-16 NOTE — Progress Notes (Signed)
Physical Therapy Treatment Patient Details Name: [name] MRN: [id number] DOB: [birth year] Today's Date: 12/16/2019    History of Present Illness 54 y/o gentleman with history of ESRD on HD, HTN, nonhealing surgical wounds on LLE from prior AV grafts, recent history of septic arthritis in left knee, severe AS, CAD, spina bifida with  Myelomeningocele, L great toe amputation, nephrostomy, anxiety who presents with progressive right knee pain that began 4 days ago. s/p I&D RLE due to spontaneous hemarthrosis vs septic arthosis.    PT Comments    Pt supine on arrival, agreeable to therapy session and with good participation and fair tolerance for mobility. Pt expressed anxiety/frustration throughout session regarding increased R knee pain and RN/PA notified. Primary session focus on progressing standing/gait tolerance and safety with mobility, pt able to progress gait distance to 40ft using RW and min guard for safety, pt min guard for transfers and Supervision for bed mobility. Pt continues to benefit from PT services to progress toward functional mobility goals. D/C recs below, pending progress, will continue to assess.   Follow Up Recommendations  Home health PT;Supervision for mobility/OOB;SNF (pt seems likely to refuse SNF)     Equipment Recommendations  None recommended by PT;Other (comment) (pt reports he has RW, hospital bed, WC)    Recommendations for Other Services       Precautions / Restrictions Precautions Precautions: Fall Restrictions Weight Bearing Restrictions: Yes RLE Weight Bearing: Weight bearing as tolerated LLE Weight Bearing: Weight bearing as tolerated Other Position/Activity Restrictions: recent R knee doppler, negative for DVT per chart review    Mobility  Bed Mobility Overal bed mobility: Needs Assistance Bed Mobility: Supine to Sit     Supine to sit: Supervision;HOB elevated     General bed mobility comments: HOB elevated, use of bed  features  Transfers Overall transfer level: Needs assistance Equipment used: Rolling walker (2 wheeled) Transfers: Sit to/from Stand Sit to Stand: Min guard;From elevated surface         General transfer comment: elevated bed height 5-6" per home setup  Ambulation/Gait Ambulation/Gait assistance: Min guard Gait Distance (Feet): 25 Feet Assistive device: Rolling walker (2 wheeled) Gait Pattern/deviations: Step-through pattern;Decreased stride length;Trunk flexed;Wide base of support (crouched posture and B feet plantarflexed 2/2 spina bifida) Gait velocity: decr   General Gait Details: Min guard for safety, pt ambulating with forward flexed posture and wide BOS/outtoeing   Stairs             Wheelchair Mobility    Modified Rankin (Stroke Patients Only)       Balance Overall balance assessment: Needs assistance Sitting-balance support: No upper extremity supported;Feet supported Sitting balance-Leahy Scale: Good Sitting balance - Comments: no LOB seated EOB   Standing balance support: Bilateral upper extremity supported;During functional activity Standing balance-Leahy Scale: Poor Standing balance comment: reliant on RW and min guard for safety                            Cognition Arousal/Alertness: Awake/alert Behavior During Therapy: WFL for tasks assessed/performed Overall Cognitive Status: Within Functional Limits for tasks assessed                                 General Comments: participatory, anxious regarding medical plan and wanting to speak with ortho MD (a different one)      Exercises      General Comments General  comments (skin integrity, edema, etc.): pt reporting his R knee pain has changed in terms of location and severity and R knee edema appears worse; pt reporting ortho MD has not been in to see him and he is interested in a second opinion for orthopedic plan of care. Case Mgr, RN and ortho PA notified       Pertinent Vitals/Pain Pain Assessment: 0-10 Pain Score: 9  Pain Location: R knee Pain Descriptors / Indicators: Grimacing;Guarding;Sore;Moaning Pain Intervention(s): Monitored during session;Premedicated before session;Repositioned;Ice applied   Vitals: pt denies dizziness with transfers SpO2/HR stable on RA and not further assessed.   12/16/19 0530  BP: (!) 102/41  Pulse: 72  Resp: 18  Temp: 99.1 F (37.3 C)  SpO2: 98%    Home Living                      Prior Function            PT Goals (current goals can now be found in the care plan section) Acute Rehab PT Goals Patient Stated Goal: pain control, go home once he has a second opinion PT Goal Formulation: With patient Time For Goal Achievement: 12/24/19 Potential to Achieve Goals: Fair Progress towards PT goals: Progressing toward goals    Frequency    Min 5X/week      PT Plan Current plan remains appropriate    Co-evaluation              AM-PAC PT "6 Clicks" Mobility   Outcome Measure  Help needed turning from your back to your side while in a flat bed without using bedrails?: A Little Help needed moving from lying on your back to sitting on the side of a flat bed without using bedrails?: A Little Help needed moving to and from a bed to a chair (including a wheelchair)?: A Little Help needed standing up from a chair using your arms (e.g., wheelchair or bedside chair)?: A Little Help needed to walk in hospital room?: A Little Help needed climbing 3-5 steps with a railing? : A Lot 6 Click Score: 17    End of Session Equipment Utilized During Treatment: Gait belt Activity Tolerance: Patient tolerated treatment well;Patient limited by pain Patient left: in chair;with call bell/phone within reach (pt notified to use call bell prior to out of chair mobility, PTA checked on pt shortly after and pt working with OT.) Nurse Communication: Mobility status;Patient requests pain meds PT Visit  Diagnosis: Other abnormalities of gait and mobility (R26.89);Muscle weakness (generalized) (M62.81)     Time: 2355-7322 PT Time Calculation (min) (ACUTE ONLY): 24 min  Charges:  $Gait Training: 8-22 mins $Therapeutic Activity: 8-22 mins                     Christphor Groft P., PTA Acute Rehabilitation Services Pager: [phone] Office: [place] 12/16/2019, 12:07 PM

## 2019-12-16 NOTE — Plan of Care (Signed)

## 2019-12-16 NOTE — Progress Notes (Signed)
Occupational Therapy Treatment Patient Details Name: [name] MRN: [id number] DOB: 04/12/1965 Today's Date: 12/16/2019    History of present illness 54 y/o gentleman with history of ESRD on HD, HTN, nonhealing surgical wounds on LLE from prior AV grafts, recent history of septic arthritis in left knee, severe AS, CAD, spina bifida with  Myelomeningocele, L great toe amputation, nephrostomy, anxiety who presents with progressive right knee pain that began 4 days ago. s/p I&D RLE due to spontaneous hemarthrosis vs septic arthosis.   OT comments  Pt making gradual progress towards OT goals, able to demo short mobility in room with min guard using RW. Pt Setup for UB ADLs, Min A for LB ADLs (encouraged use of AE at discharge). Pt continues to be limited by pain but able to demonstrate ability to complete functional tasks with limited assist. Pt expressed frustration with pain and would like to further speak with MD regarding pain. Provided reinforcement of positioning of R LE, use of ice and exercises to prevent stiffness. If pain more controlled, anticipate pt to do well at home. However, if pain still a limiting factor to progress, pt open to considering postacute rehab.    Follow Up Recommendations  No OT follow up;Supervision - Intermittent (consider postacute rehab if pain still a limiting factor)    Equipment Recommendations  None recommended by OT (appears well equipped; encouraged pt to look into AE for LB ADLs)    Recommendations for Other Services      Precautions / Restrictions Precautions Precautions: Fall Restrictions Weight Bearing Restrictions: Yes RLE Weight Bearing: Weight bearing as tolerated LLE Weight Bearing: Weight bearing as tolerated Other Position/Activity Restrictions: recent R knee doppler, negative for DVT per chart review       Mobility Bed Mobility Overal bed mobility: Needs Assistance Bed Mobility: Sit to Supine     Supine to sit:  Supervision;HOB elevated Sit to supine: Min assist   General bed mobility comments: Min A for lifting R LE to bed. Instructed in use of gait belt as leg lifter  Transfers Overall transfer level: Needs assistance Equipment used: Rolling walker (2 wheeled) Transfers: Sit to/from Stand Sit to Stand: Supervision         General transfer comment: Supervision for sit to stand using RW, min guard for dynamic tasks and turning with RW for safety. Increased time/effort for tasks due to pain    Balance Overall balance assessment: Needs assistance Sitting-balance support: No upper extremity supported;Feet supported Sitting balance-Leahy Scale: Good Sitting balance - Comments: no LOB seated EOB   Standing balance support: Bilateral upper extremity supported;During functional activity Standing balance-Leahy Scale: Poor Standing balance comment: reliant on RW and min guard for safety                           ADL either performed or assessed with clinical judgement   ADL Overall ADL's : Needs assistance/impaired                 Upper Body Dressing : Set up;Sitting Upper Body Dressing Details (indicate cue type and reason): Setup to don fresh gown. Had blood stains on gown from heparin shot earlier per pt   Lower Body Dressing Details (indicate cue type and reason): Encouraged and reminded of use of sock aide for LB ADLs             Functional mobility during ADLs: Min guard;Rolling walker General ADL Comments: Pt continues to be limited  by pain but shows ability to complete functional tasks with increased time/effort     Vision   Vision Assessment?: No apparent visual deficits   Perception     Praxis      Cognition Arousal/Alertness: Awake/alert Behavior During Therapy: WFL for tasks assessed/performed Overall Cognitive Status: Within Functional Limits for tasks assessed                                 General Comments: a bit frustrated  with knee pain/situation, plesant towards OT but would like for MD to come examine LE        Exercises     Shoulder Instructions       General Comments Pt reporting increased swelling, expanded pain and redness on inside of R knee. Pt requesting to ortho MD to examine leg. Pt aware of rehab vs home discussion. Would like further answers from MD on reason for increased R knee pain before making decisions    Pertinent Vitals/ Pain       Pain Assessment: 0-10 Pain Score: 8  Pain Location: R knee Pain Descriptors / Indicators: Grimacing;Guarding;Sore;Moaning Pain Intervention(s): Monitored during session;Repositioned;Patient requesting pain meds-RN notified;Limited activity within patient's tolerance;Ice applied  Home Living                                          Prior Functioning/Environment              Frequency  Min 2X/week        Progress Toward Goals  OT Goals(current goals can now be found in the care plan section)  Progress towards OT goals: Progressing toward goals  Acute Rehab OT Goals Patient Stated Goal: pain control, go home once he has a second opinion OT Goal Formulation: With patient Time For Goal Achievement: 12/25/19 Potential to Achieve Goals: Good ADL Goals Pt Will Perform Grooming: with modified independence;standing Pt Will Perform Lower Body Dressing: with modified independence;with adaptive equipment;sit to/from stand;sitting/lateral leans Pt Will Transfer to Toilet: with modified independence;ambulating;regular height toilet  Plan Discharge plan needs to be updated    Co-evaluation                 AM-PAC OT "6 Clicks" Daily Activity     Outcome Measure   Help from another person eating meals?: None Help from another person taking care of personal grooming?: A Little Help from another person toileting, which includes using toliet, bedpan, or urinal?: A Little Help from another person bathing (including washing,  rinsing, drying)?: A Little Help from another person to put on and taking off regular upper body clothing?: A Little Help from another person to put on and taking off regular lower body clothing?: A Little 6 Click Score: 19    End of Session Equipment Utilized During Treatment: Gait belt;Rolling walker  OT Visit Diagnosis: Unsteadiness on feet (R26.81);Other abnormalities of gait and mobility (R26.89);Pain Pain - Right/Left: Right Pain - part of body: Knee   Activity Tolerance Patient limited by pain   Patient Left in bed;with call bell/phone within reach;with bed alarm set   Nurse Communication Mobility status;Patient requests pain meds        Time: 5374-8270 OT Time Calculation (min): 31 min  Charges: OT General Charges $OT Visit: 1 Visit OT Treatments $Self Care/Home Management : 8-22 mins $Therapeutic  Activity: 8-22 mins  [name], OTR/L   [name] 12/16/2019, 12:47 PM

## 2019-12-16 NOTE — Plan of Care (Signed)

## 2019-12-16 NOTE — Progress Notes (Signed)
HD#7 Subjective:  Overnight Events: none   Patient states that he still having right leg pain. He describes it as sharp and constant. The pain is behind his right leg. States the leg pain started when he got up to use the bathroom a few days ago. He had some numbness at that time. States he had significant pain during dialysis yesterday which took place at 1 am last night. States he had chills after dialysis last night. States he turns from side to side to get comfortable. He also endorse chronic low back pain.   He was informed that we did not find a blood clot in his leg yesterday. He wants someone to come look at his knee swelling and pain which has gotten worse. Team will reach out to orthopaedic today to come see him.   Objective:  Vital signs in last 24 hours: Vitals:   12/16/19 0400 12/16/19 0430 12/16/19 0500 12/16/19 0530  BP: (!) 98/23 92/72 (!) 80/38 (!) 102/41  Pulse: 68 72 72 72  Resp:    18  Temp:    99.1 F (37.3 C)  TempSrc:    Oral  SpO2:    98%  Weight:      Height:       Supplemental O2: Room Air SpO2: 98 %   Physical Exam:  Physical Exam Constitutional:      Appearance: Normal appearance.  HENT:     Head: Normocephalic and atraumatic.  Eyes:     Extraocular Movements: Extraocular movements intact.  Cardiovascular:     Rate and Rhythm: Normal rate.     Pulses: Normal pulses.     Heart sounds: Murmur heard.    Pulmonary:     Effort: Pulmonary effort is normal.     Breath sounds: Normal breath sounds.  Abdominal:     General: Bowel sounds are normal.     Palpations: Abdomen is soft.     Tenderness: There is no abdominal tenderness.  Musculoskeletal:        General: Swelling (right knee, stable) and tenderness (right knee) present.     Cervical back: Normal range of motion.     Right lower leg: No edema.     Left lower leg: No edema.     Comments: Small area of erythema of lateral calf   Skin:    General: Skin is warm and dry.   Neurological:     Mental Status: He is alert and oriented to person, place, and time. Mental status is at baseline.     Motor: Weakness (generalized weakness) present.  Psychiatric:        Mood and Affect: Mood normal.     Filed Weights   12/10/19 1215 12/12/19 0815 12/12/19 1129  Weight: 117.6 kg 120.4 kg 118 kg     Intake/Output Summary (Last 24 hours) at 12/16/2019 0644 Last data filed at 12/16/2019 0530 Gross per 24 hour  Intake -  Output 1229 ml  Net -1229 ml   Net IO Since Admission: -7,303 mL [12/16/19 0644]  Pertinent Labs: CBC Latest Ref Rng & Units 12/13/2019 12/12/2019 12/11/2019  WBC 4.0 - 10.5 K/uL 5.9 5.9 7.5  Hemoglobin 13.0 - 17.0 g/dL 8.9(L) 9.3(L) 9.3(L)  Hematocrit 39.0 - 52.0 % 29.4(L) 29.6(L) 31.1(L)  Platelets 150 - 400 K/uL 148(L) 152 168    CMP Latest Ref Rng & Units 12/14/2019 12/13/2019 12/12/2019  Glucose 70 - 99 mg/dL 118(H) 100(H) 92  BUN 6 - 20 mg/dL 44(H) 61(H)  72(H)  Creatinine 0.61 - 1.24 mg/dL 7.12(H) 8.89(H) 9.01(H)  Sodium 135 - 145 mmol/L 135 134(L) 134(L)  Potassium 3.5 - 5.1 mmol/L 4.6 5.4(H) 5.6(H)  Chloride 98 - 111 mmol/L 98 96(L) 91(L)  CO2 22 - 32 mmol/L 24 25 27   Calcium 8.9 - 10.3 mg/dL 9.3 9.4 9.1  Total Protein 6.5 - 8.1 g/dL - - -  Total Bilirubin 0.3 - 1.2 mg/dL - - -  Alkaline Phos 38 - 126 U/L - - -  AST 15 - 41 U/L - - -  ALT 0 - 44 U/L - - -    Imaging: VAS [place] LOWER EXTREMITY VENOUS (DVT)  Result Date: 12/15/2019  Lower Venous DVT Study Indications: Pain, Swelling, and Erythema.  Risk Factors: Recent I&D of left. Limitations: Bandages and Dialysis catheter in left thigh. Edema. Comparison Study: No prior study on file Performing Technologist: Sharion Dove RVS  Examination Guidelines: A complete evaluation includes B-mode imaging, spectral Doppler, color Doppler, and power Doppler as needed of all accessible portions of each vessel. Bilateral testing is considered an integral part of a complete examination.  Limited examinations for reoccurring indications may be performed as noted. The reflux portion of the exam is performed with the patient in reverse Trendelenburg.  +---------+---------------+---------+-----------+----------+-------------------+ RIGHT    CompressibilityPhasicitySpontaneityPropertiesThrombus Aging      +---------+---------------+---------+-----------+----------+-------------------+ CFV                                                   patent by color     +---------+---------------+---------+-----------+----------+-------------------+ SFJ                                                   patent by color     +---------+---------------+---------+-----------+----------+-------------------+ FV Prox                                               patent by color     +---------+---------------+---------+-----------+----------+-------------------+ FV Mid                                                patent by color     +---------+---------------+---------+-----------+----------+-------------------+ FV Distal                                             patent by color     +---------+---------------+---------+-----------+----------+-------------------+ PFV                                                   Not well visualized +---------+---------------+---------+-----------+----------+-------------------+ POP      Full           Yes      Yes                                      +---------+---------------+---------+-----------+----------+-------------------+  PTV      Full                                                             +---------+---------------+---------+-----------+----------+-------------------+ PERO                                                  not all segments                                                          well visualized     +---------+---------------+---------+-----------+----------+-------------------+  GSV      Full                                                             +---------+---------------+---------+-----------+----------+-------------------+   Left Technical Findings: Left leg not evaluated.   Summary: RIGHT: - There is no evidence of deep vein thrombosis in the lower extremity. However, portions of this examination were limited- see technologist comments above.  - No cystic structure found in the popliteal fossa.   *See table(s) above for measurements and observations. Electronically signed by Ruta Hinds MD on 12/15/2019 at 7:40:04 PM.    Final     Assessment/Plan:   Principal Problem:   Hemarthrosis, right knee Active Problems:   ESRD on dialysis [facility])   Patient Summary: Mr. Vieth is a 54 y/o gentleman with history of ESRD on HD, HTN, nonhealing surgical wounds from prior AV grafts, recent history of septic arthritis in left knee, severe AS, CAD,spina bifida with Myelomeningocele who presents with progressive right knee pain that began 4 days agoadmitted for concern of septic arthritis, on further workup suspect spontaneous hemarthrosis.  Right knee effusion like due tospontaneoushemarthrosis Continues to have significant sharp pain in from below right knee to the ankle. [place] yesterday without DVT or cystic structures. Discussed with orthopedics, not recommending any surgical intervention at this time. Discussed with radiology recommending R knee MRI.  - MRI right knee - Continue to work with PT while in hospital - Oxycodone 10 mg q4 PRN for pain - Increased dilaudid to 1.5 mg q2prnbreakthroughpain, phenergan prn nausea  ESRD on HD Difficult time at dialysis yesterday due to knee pain and swelling -appreciate nephrology consult for inpatient HD management.  - monitor renal function - MWF dialysis  - Lasix 80 mg once today  Chronic anxiety -continue hom lexapro and ativan   CAD with unstable angina/3-vessel disease  Severe Aortic  stenosis -Continued statin and plavix  DVT ppx: heparin Diet: Renal  CODE: FULL  Dispo: Anticipated discharge to Home pending medical mangement.  [name] 6:44 AM, 12/16/2019  Pager [phone]  Please contact the on call pager after 5 pm and on weekends at [phone].

## 2019-12-16 NOTE — Consult Note (Signed)
Reason for Consult:  Right knee pain Referring Physician: Dr. Jonna Munro [name] is an 54 y.o. male.  HPI: 54 y/o male with PMH of ESRD and spina bifida c/o R knee pain since surgery a few days ago.  He has a h/o L knee infection back in October treated by Dr. Marlou Sa with arthroscopic I and D and abx.  Last week he had new onset right knee pain and presented to the ER.  His arthrocentesis was equivocal.  Due to his h/o L knee infection he was taken to the OR for arthroscopic I and D by Dr. Marcelino Scot.  He felt improvement in his pain initially but c/o recurrent pain in the knee that radiates down to the proximal leg.  He denies f/n/v but c/o intermittent chills.  DVT scan is negative.  MRI has been ordered by the primary team.  He is not on any abx.  Past Medical History:  Diagnosis Date  . Anemia   . Anxiety   . Constipation   . End stage renal failure on dialysis [facility])    [place]; MWF, on HD since 1997, exhausted upper ext access, and possibly LE access; cath dependent in R groin as of 06/2018  . GERD (gastroesophageal reflux disease)   . Grand mal seizure ([place]) X 4   last 1998 (02/29/2016)  . History of blood transfusion 2000s   "I was having blood loss; never found out from where"  . History of kidney stones   . Hypertension    hx of - has not taken bp meds in over 2 years  since dialysis  . Hypothyroidism   . Insomnia   . Migraine    "due to my BP in my late 1990s; none since" (02/29/2016)  . Nonhealing surgical wound    of the left arteriovenous graft  . Severe aortic stenosis    no problems with per pt. - nephrologist and Dr. Coletta Memos  . Spina bifida [facility])     Past Surgical History:  Procedure Laterality Date  . AMPUTATION Left 12/11/2017   Procedure: Left Great Toe Amputation;  Surgeon: [name], MD;  Location: [place];  Service: Orthopedics;  Laterality: Left;  . APPENDECTOMY    . APPLICATION OF WOUND VAC Left 10/04/2019   Procedure: APPLICATION OF WOUND VAC;  Surgeon:  Serafina Mitchell, MD;  Location: [place];  Service: Vascular;  Laterality: Left;  . ARTERIOVENOUS GRAFT PLACEMENT Left 10/16/2012   left femoral goretex graft         Dr Donnetta Hutching  . AV FISTULA PLACEMENT Left 06/27/2012   Procedure: EXPLORATORY LEFT THY-GRAFT PSEUDO-ANEURYSM;  Surgeon: Conrad Shannon Hills, MD;  Location: [place];  Service: Vascular;  Laterality: Left;  Revision of left Arteriovenus gortex graft in thigh.  [place] Savin REMOVAL Left 10/04/2019   Procedure: REMOVAL OF ARTERIOVENOUS GORETEX GRAFT (Force);  Surgeon: Serafina Mitchell, MD;  Location: [facility] OR;  Service: Vascular;  Laterality: Left;  . COLONOSCOPY    . DRESSING CHANGE UNDER ANESTHESIA Left 10/07/2019   Procedure: DRESSING CHANGE UNDER ANESTHESIA;  Surgeon: Meredith Pel, MD;  Location: [place];  Service: Orthopedics;  Laterality: Left;  . FLEXIBLE SIGMOIDOSCOPY N/A 12/03/2018   Procedure: FLEXIBLE SIGMOIDOSCOPY;  Surgeon: Yetta Flock, MD;  Location: [place];  Service: Gastroenterology;  Laterality: N/A;  . I & D EXTREMITY Left 02/29/2016   Procedure: DEBRIDEMENT LEFT THIGH WOUND;  Surgeon: Waynetta Sandy, MD;  Location: [place];  Service: Vascular;  Laterality:  Left;  . I & D EXTREMITY Left 10/04/2019   Procedure: IRRIGATION AND DEBRIDEMENT LEFT THIGH;  Surgeon: Serafina Mitchell, MD;  Location: [place];  Service: Vascular;  Laterality: Left;  . I & D EXTREMITY Right 12/09/2019   Procedure: IRRIGATION AND DEBRIDEMENT EXTREMITY;  Surgeon: Altamese Excello, MD;  Location: [place];  Service: Orthopedics;  Laterality: Right;  . ILEOSTOMY  1970s  . INCISION AND DRAINAGE Left 10/16/2012   Procedure: INCISION AND Debridement left thigh graft;  Surgeon: Rosetta Posner, MD;  Location: [place];  Service: Vascular;  Laterality: Left;  . INSERTION OF DIALYSIS CATHETER    . INSERTION OF DIALYSIS CATHETER  01/14/2016   Procedure: INSERTION OF DIALYSIS CATHETER;  Surgeon: Waynetta Sandy, MD;  Location: [place];  Service: Vascular;;  .  INSERTION OF DIALYSIS CATHETER Right 08/07/2017   Procedure: INSERTION OF DIALYSIS CATHETER;  Surgeon: Waynetta Sandy, MD;  Location: [place];  Service: Vascular;  Laterality: Right;  . INSERTION OF DIALYSIS CATHETER Right 12/21/2017   Procedure: RIGHT FEMORAL DIALYSIS CATHETER EXCHANGE;  Surgeon: Marty Heck, MD;  Location: [place];  Service: Vascular;  Laterality: Right;  . INSERTION OF ILIAC STENT Left 01/14/2016   Procedure: INSERTION OF ILIAC STENT;  Surgeon: Waynetta Sandy, MD;  Location: [place];  Service: Vascular;  Laterality: Left;  . INTRAOPERATIVE ARTERIOGRAM Left 01/14/2016   Procedure: INTRA OPERATIVE ARTERIOGRAM;  Surgeon: Waynetta Sandy, MD;  Location: [place];  Service: Vascular;  Laterality: Left;  . IR NEPHROSTOMY EXCHANGE LEFT  03/21/2019  . IR NEPHROSTOMY PLACEMENT LEFT  11/28/2018  . IRRIGATION AND DEBRIDEMENT EXTREMITY (Right   12/09/2019  . KNEE ARTHROSCOPY Left 10/07/2019   Procedure: ARTHROSCOPY KNEE w/ STIMULATING  BEAD PLACMENT;  Surgeon: Meredith Pel, MD;  Location: [place];  Service: Orthopedics;  Laterality: Left;  . LEFT HEART CATH AND CORONARY ANGIOGRAPHY N/A 10/17/2019   Procedure: LEFT HEART CATH AND CORONARY ANGIOGRAPHY;  Surgeon: Jettie Booze, MD;  Location: [place] CV LAB;  Service: Cardiovascular;  Laterality: N/A;  . LITHOTRIPSY  x 3   "& a laser treatment" (02/29/2016)  . NEPHROLITHOTOMY Left 03/26/2019   Procedure: NEPHROLITHOTOMY PERCUTANEOUS;  Surgeon: Alexis Frock, MD;  Location: [facility];  Service: Urology;  Laterality: Left;  2 HRS  . NEPHROSTOMY Bilateral 1968  . PATELLAR TENDON REPAIR Right 1990s   "big incision"  . PERIPHERAL VASCULAR CATHETERIZATION  01/14/2016   Procedure: A/V SHUNTOGRAM;  Surgeon: Waynetta Sandy, MD;  Location: [place];  Service: Vascular;;  . REVISION OF ARTERIOVENOUS GORETEX GRAFT Left 10/16/2012   Procedure: REVISION OF LEFT FEMORAL LOOP ARTERIOVENOUS GORETEX GRAFT;   Surgeon: Rosetta Posner, MD;  Location: [place];  Service: Vascular;  Laterality: Left;  . REVISION OF ARTERIOVENOUS GORETEX GRAFT Left 02/11/2015   Procedure: EXCISION OF SMALL SEGMENT OF EXPOSED LEFT THIGH NON FUNCTIONING  ARTERIOVENOUS GORETEX GRAFT;  Surgeon: Mal Misty, MD;  Location: [place];  Service: Vascular;  Laterality: Left;  . REVISION OF ARTERIOVENOUS GORETEX GRAFT Left 01/14/2016   Procedure: REVISION OF ARTERIOVENOUS GORETEX GRAFT;  Surgeon: Waynetta Sandy, MD;  Location: [place];  Service: Vascular;  Laterality: Left;  . REVISION OF ARTERIOVENOUS GORETEX GRAFT Left 02/29/2016   thigh/pt report  . REVISION OF ARTERIOVENOUS GORETEX GRAFT Left 02/29/2016   Procedure: POSSIBLE REVISION OF LEFT THIGH ARTERIOVENOUS GORETEX GRAFT;  Surgeon: Waynetta Sandy, MD;  Location: [place];  Service: Vascular;  Laterality: Left;  . TEE  WITHOUT CARDIOVERSION N/A 12/14/2017   Procedure: TRANSESOPHAGEAL ECHOCARDIOGRAM (TEE);  Surgeon: Acie Fredrickson, Wonda Cheng, MD;  Location: [place];  Service: Cardiovascular;  Laterality: N/A;  . THROMBECTOMY AND REVISION OF ARTERIOVENTOUS (AV) GORETEX  GRAFT Left 06/12/2018   Procedure: Removal of infected left thigh graft;  Surgeon: Rosetta Posner, MD;  Location: [place];  Service: Vascular;  Laterality: Left;  . THROMBECTOMY W/ EMBOLECTOMY Left 01/14/2016   Procedure: THROMBECTOMY ARTERIOVENOUS GORE-TEX Left thigh GRAFT;  Surgeon: Waynetta Sandy, MD;  Location: [place];  Service: Vascular;  Laterality: Left;  . TONGUE SURGERY  ~ 1990   tongue-tie release   . ULTRASOUND GUIDANCE FOR VASCULAR ACCESS Right 08/07/2017   Procedure: ULTRASOUND GUIDANCE FOR VASCULAR CANNULATION RIGHT FEMORAL VEIN AND LEFT AV FEMORAL GRAFT;  Surgeon: Waynetta Sandy, MD;  Location: [place];  Service: Vascular;  Laterality: Right;  . UPPER EXTREMITY VENOGRAPHY Bilateral 08/09/2017   Procedure: UPPER EXTREMITY VENOGRAPHY;  Surgeon: Serafina Mitchell, MD;  Location: [place]  CV LAB;  Service: Cardiovascular;  Laterality: Bilateral;  . VENOGRAM N/A 09/20/2017   Procedure: RIGHT COMMON FEMORAL ARTERY EXPLORATION.  CANNULATION RIGHT COMMON FEMORAL VEIN. VENOGRAM CENTRAL ULTRA SOUND GUIDED RIGHT FEMORAL VEIN TIMES TWO.;  Surgeon: Waynetta Sandy, MD;  Location: [place];  Service: Vascular;  Laterality: N/A;  . VENOGRAM Right 10/04/2019   Procedure: VENOGRAM;  Surgeon: Serafina Mitchell, MD;  Location: [place];  Service: Vascular;  Laterality: Right;  . WOUND DEBRIDEMENT Left 02/29/2016   thigh    Family History  Problem Relation Age of Onset  . Diabetes Mother   . Hypertension Mother   . Heart disease Mother        before age 54  . Diabetes Father   . Heart attack Father        X's 3  . Diabetes Sister   . Bipolar disorder Sister     Social History:  reports that he has never smoked. He has never used smokeless tobacco. He reports previous alcohol use. He reports previous drug use. Drug: Marijuana.  Allergies:  Allergies  Allergen Reactions  . Hydrocodone Other (See Comments)    Caused involuntary movement and twitching. CANNOT TAKE DUE TO MUSCLE SPASMS AND MUSCLE TREMORS  . Colace [Docusate] Diarrhea and Nausea And Vomiting  . Furadantin [Nitrofurantoin] Other (See Comments)    UNSPECIFIED REACTION   . Mandelamine [Methenamine] Other (See Comments)    UNSPECIFIED REACTION   . Noroxin [Norfloxacin] Other (See Comments)    UNSPECIFIED REACTION   . Sulfa Antibiotics Other (See Comments) and Cough    Childhood reaction - pt could not confirm that it was a cough  . Sulfur Cough    Childhood reaction - pt could not confirm that it was a cough    Medications: I have reviewed the patient's current medications.  No results found for this or any previous visit (from the past 48 hour(s)).  VAS [place] LOWER EXTREMITY VENOUS (DVT)  Result Date: 12/15/2019  Lower Venous DVT Study Indications: Pain, Swelling, and Erythema.  Risk Factors: Recent I&D of left.  Limitations: Bandages and Dialysis catheter in left thigh. Edema. Comparison Study: No prior study on file Performing Technologist: Sharion Dove RVS  Examination Guidelines: A complete evaluation includes B-mode imaging, spectral Doppler, color Doppler, and power Doppler as needed of all accessible portions of each vessel. Bilateral testing is considered an integral part of a complete examination. Limited examinations for reoccurring indications may be performed as noted. The  reflux portion of the exam is performed with the patient in reverse Trendelenburg.  +---------+---------------+---------+-----------+----------+-------------------+ RIGHT    CompressibilityPhasicitySpontaneityPropertiesThrombus Aging      +---------+---------------+---------+-----------+----------+-------------------+ CFV                                                   patent by color     +---------+---------------+---------+-----------+----------+-------------------+ SFJ                                                   patent by color     +---------+---------------+---------+-----------+----------+-------------------+ FV Prox                                               patent by color     +---------+---------------+---------+-----------+----------+-------------------+ FV Mid                                                patent by color     +---------+---------------+---------+-----------+----------+-------------------+ FV Distal                                             patent by color     +---------+---------------+---------+-----------+----------+-------------------+ PFV                                                   Not well visualized +---------+---------------+---------+-----------+----------+-------------------+ POP      Full           Yes      Yes                                      +---------+---------------+---------+-----------+----------+-------------------+ PTV       Full                                                             +---------+---------------+---------+-----------+----------+-------------------+ PERO                                                  not all segments  well visualized     +---------+---------------+---------+-----------+----------+-------------------+ GSV      Full                                                             +---------+---------------+---------+-----------+----------+-------------------+   Left Technical Findings: Left leg not evaluated.   Summary: RIGHT: - There is no evidence of deep vein thrombosis in the lower extremity. However, portions of this examination were limited- see technologist comments above.  - No cystic structure found in the popliteal fossa.   *See table(s) above for measurements and observations. Electronically signed by Ruta Hinds MD on 12/15/2019 at 7:40:04 PM.    Final     ROS:  No f/n/v.  10 system review is negative. PE:  Blood pressure (!) 94/53, pulse 67, temperature 98.6 F (37 C), temperature source Oral, resp. rate 15, height 5\' 11"  (1.803 m), weight 118 kg, SpO2 99 %. wn wd male in nad.  A and O x 4.  Normal mood and affect.  EOMI.  resp unlabored.  R knee with fresh incisions.  Moderate effusion.  No erythema.  TTP at the medial joint line.  Slightly TTP at the medial calf.  bilat calves have atrophy.  Skin o/w intact.  Sens to LT intact dorsally and plantarly at the forefoot.  R knee ROM is a 30 deg flexion contracture to 105 deg of flexion.  Assessment/Plan: R knee effusion.  I explained to the patient the nature of reactive effusion.  He was noted to have a hemarthrosis of his knee at the time of his last surgery.  Cultures from surgery as well as the initial aspirate have no growth and are both final.  He has a normal white count and has been afebrile.  At this point there is no indication for  further surgery.  I have spoken with Dr. Marcelino Scot.  He will f/u on the MRI of the knee ordered by the primary team.  In the meantime the patient can bear weight as tolerated on the right knee.  I will sign off.  Please call Dr. Marcelino Scot or Mr. Eddie Dibbles with any additional questions.  Wylene Simmer 12/16/2019, 7:03 PM

## 2019-12-16 NOTE — Progress Notes (Signed)
Dr Heber [place] notified of the patient questioning subq Heparin dose today- due to his nosebleed yesterday and oozing from injection site.  Patient wants to hold today and restart tomorrow.  Patient is awaiting MRI right knee

## 2019-12-16 NOTE — Progress Notes (Signed)
Subjective: Continued pain and swelling with knee, reports hemodialysis difficulties yesterday with PermCath same side as knee pain ,p cath positional so could not move right leg because of knee pain.  currently no shortness of breath or chest pain no nausea vomiting.  States  As outpatient sometimes take Lasix to help with volume as needed ,we will continue trial here Objective Vital signs in last 24 hours: Vitals:   12/16/19 0400 12/16/19 0430 12/16/19 0500 12/16/19 0530  BP: (!) 98/23 92/72 (!) 80/38 (!) 102/41  Pulse: 68 72 72 72  Resp:    18  Temp:    99.1 F (37.3 C)  TempSrc:    Oral  SpO2:    98%  Weight:      Height:       Weight change:   Physical Exam General sitting up in chair, alert,chronically ill appearing male, NAD Heart:RRR, +7/7 systolic murmur Lungs:CTAB. nml WOB Abdomen:soft, NTND, has minimal urine output in ileal conduit bag Extremities:no LE edema, L thigh and R knee dressed, Dialysis Access: R femoral TDC    Dialysis Orders: [facility], MWF, EDW116.5 kg, 2K, 2.25 calcium bath,right femoralPermCath, Sensipar 30 mg q. hemodialysis, Hectorol 4 mics q. dialysis, heparin 3700 units Mircera 150 MCG given last on 12/01/2019, Venofer 50 q. hemodialysis weekly   Problem/Plan: 1. Right kneeeffusion -spontaneous hemarthrosis (all cultures negative so far) s/p I&D on 12/7 [facility] & synovial fluid with NGTD.orthopedics following, IV Vanc/Zosyn.   Patient reports continued new pain around knee  Vas [place] completed with prelim showing no DVT or baker cyst.  Pain management/ABXper ortho/admit. 2. ESRD -HD MWF. HD per regular schedule.  Start Lasix 80 mg daily as he takes as an outpatient with reported urine output as an outpatient 3. History of hyperkalemia-last K 4.6 .Follow daily,noted patient does take as outpatient Kayexalate on Saturday and Sundays, in hospital will use Lokelma if neededover weekend. Checking labs pre HD. 4. Hypertension/volume -BPok.Close  to dry weight, does not appear volume overloaded. UF as tolerated.  5. Anemia -last Hgb8.9,continue ESA on schedule- next due 12/13, aranesp 137mcg ordered,hold his weekly iron secondary to infected knee 6. Metabolic bone disease -Caand phos in goal.continue binders andVDRA. 7. Nutrition -renal dietw/fluid restrictions  Ernest Haber, PA-C [facility] Beeper [phone] 12/16/2019,9:52 AM  LOS: 7 days   Labs: Basic Metabolic Panel: Recent Labs  Lab 12/11/19 0257 12/12/19 0343 12/13/19 0459 12/14/19 0306  NA 137 134* 134* 135  K 4.6 5.6* 5.4* 4.6  CL 95* 91* 96* 98  CO2 28 27 25 24   GLUCOSE 102* 92 100* 118*  BUN 56* 72* 61* 44*  CREATININE 7.47* 9.01* 8.89* 7.12*  CALCIUM 8.9 9.1 9.4 9.3  PHOS 3.8  --  4.1 4.2   Liver Function Tests: Recent Labs  Lab 12/11/19 0257 12/13/19 0459 12/14/19 0306  ALBUMIN 2.7* 2.8* 2.8*   No results for input(s): LIPASE, AMYLASE in the last 168 hours. No results for input(s): AMMONIA in the last 168 hours. CBC: Recent Labs  Lab 12/10/19 0336 12/11/19 0257 12/11/19 2045 12/12/19 0700 12/13/19 0459  WBC 6.9 6.4 7.5 5.9 5.9  HGB 9.5* 9.0* 9.3* 9.3* 8.9*  HCT 31.5* 29.6* 31.1* 29.6* 29.4*  MCV 97.2 96.4 96.3 95.8 96.4  PLT 132* 134* 168 152 148*   Cardiac Enzymes: No results for input(s): CKTOTAL, CKMB, CKMBINDEX, TROPONINI in the last 168 hours. CBG: No results for input(s): GLUCAP in the last 168 hours.  Studies/Results: VAS [place] LOWER EXTREMITY VENOUS (DVT)  Result Date: 12/15/2019  Lower Venous DVT Study Indications: Pain, Swelling, and Erythema.  Risk Factors: Recent I&D of left. Limitations: Bandages and Dialysis catheter in left thigh. Edema. Comparison Study: No prior study on file Performing Technologist: Sharion Dove RVS  Examination Guidelines: A complete evaluation includes B-mode imaging, spectral Doppler, color Doppler, and power Doppler as needed of all accessible portions of each vessel.  Bilateral testing is considered an integral part of a complete examination. Limited examinations for reoccurring indications may be performed as noted. The reflux portion of the exam is performed with the patient in reverse Trendelenburg.  +---------+---------------+---------+-----------+----------+-------------------+ RIGHT    CompressibilityPhasicitySpontaneityPropertiesThrombus Aging      +---------+---------------+---------+-----------+----------+-------------------+ CFV                                                   patent by color     +---------+---------------+---------+-----------+----------+-------------------+ SFJ                                                   patent by color     +---------+---------------+---------+-----------+----------+-------------------+ FV Prox                                               patent by color     +---------+---------------+---------+-----------+----------+-------------------+ FV Mid                                                patent by color     +---------+---------------+---------+-----------+----------+-------------------+ FV Distal                                             patent by color     +---------+---------------+---------+-----------+----------+-------------------+ PFV                                                   Not well visualized +---------+---------------+---------+-----------+----------+-------------------+ POP      Full           Yes      Yes                                      +---------+---------------+---------+-----------+----------+-------------------+ PTV      Full                                                             +---------+---------------+---------+-----------+----------+-------------------+ PERO  not all segments                                                          well visualized      +---------+---------------+---------+-----------+----------+-------------------+ GSV      Full                                                             +---------+---------------+---------+-----------+----------+-------------------+   Left Technical Findings: Left leg not evaluated.   Summary: RIGHT: - There is no evidence of deep vein thrombosis in the lower extremity. However, portions of this examination were limited- see technologist comments above.  - No cystic structure found in the popliteal fossa.   *See table(s) above for measurements and observations. Electronically signed by Ruta Hinds MD on 12/15/2019 at 7:40:04 PM.    Final    Medications:  . acetaminophen  650 mg Oral Q8H  . calcium acetate  667 mg Oral TID WC  . Chlorhexidine Gluconate Cloth  6 each Topical Q0600  . cinacalcet  30 mg Oral Q M,W,F-HD  . clopidogrel  75 mg Oral Daily  . darbepoetin (ARANESP) injection - DIALYSIS  150 mcg Intravenous Q Mon-HD  . escitalopram  5 mg Oral Daily  . famotidine  20 mg Oral Daily  . feeding supplement (NEPRO CARB STEADY)  237 mL Oral Daily  . heparin injection (subcutaneous)  5,000 Units Subcutaneous Q8H  . levothyroxine  137 mcg Oral QAC breakfast  . rosuvastatin  10 mg Oral Daily  . traZODone  100 mg Oral QHS  . zolpidem  10 mg Oral QHS

## 2019-12-16 NOTE — Progress Notes (Signed)
The patient has multiple complaints this morning.  He reports having increased pain in the right knee and lower right leg.  He reports being frustrated with his care and has mentioned that he feels like he should have a 2nd consult for ortho problems with his left leg.  The patient has continued to call out frequently for pain medications but on arrival to the room, the patient is sleeping, snoring most of the time.

## 2019-12-16 NOTE — Plan of Care (Signed)
  Problem: Activity: Goal: Risk for activity intolerance will decrease Outcome: Progressing   Problem: Coping: Goal: Level of anxiety will decrease Outcome: Progressing   Problem: Pain Managment: Goal: General experience of comfort will improve Outcome: Progressing   Problem: Safety: Goal: Ability to remain free from injury will improve Outcome: Progressing   Problem: Skin Integrity: Goal: Risk for impaired skin integrity will decrease Outcome: Progressing   

## 2019-12-16 NOTE — Care Management Important Message (Signed)
Important Message  Patient Details  Name: [name] MRN: [id number] Date of Birth: 18-Sep-1965   Medicare Important Message Given:  Yes     [name] 12/16/2019, 3:15 PM

## 2019-12-17 ENCOUNTER — Inpatient Hospital Stay (HOSPITAL_COMMUNITY): Payer: Medicare Other

## 2019-12-17 LAB — CBC
HCT: 29.3 % — ABNORMAL LOW (ref 39.0–52.0)
Hemoglobin: 8.7 g/dL — ABNORMAL LOW (ref 13.0–17.0)
MCH: 28.7 pg (ref 26.0–34.0)
MCHC: 29.7 g/dL — ABNORMAL LOW (ref 30.0–36.0)
MCV: 96.7 fL (ref 80.0–100.0)
Platelets: 185 10*3/uL (ref 150–400)
RBC: 3.03 MIL/uL — ABNORMAL LOW (ref 4.22–5.81)
RDW: 16.5 % — ABNORMAL HIGH (ref 11.5–15.5)
WBC: 5.6 10*3/uL (ref 4.0–10.5)
nRBC: 0 % (ref 0.0–0.2)

## 2019-12-17 LAB — RENAL FUNCTION PANEL
Albumin: 3.2 g/dL — ABNORMAL LOW (ref 3.5–5.0)
Anion gap: 14 (ref 5–15)
BUN: 88 mg/dL — ABNORMAL HIGH (ref 6–20)
CO2: 23 mmol/L (ref 22–32)
Calcium: 9.3 mg/dL (ref 8.9–10.3)
Chloride: 95 mmol/L — ABNORMAL LOW (ref 98–111)
Creatinine, Ser: 11.23 mg/dL — ABNORMAL HIGH (ref 0.61–1.24)
GFR, Estimated: 5 mL/min — ABNORMAL LOW (ref >60)
Glucose, Bld: 87 mg/dL (ref 70–99)
Phosphorus: 7 mg/dL — ABNORMAL HIGH (ref 2.5–4.6)
Potassium: 7 mmol/L (ref 3.5–5.1)
Sodium: 132 mmol/L — ABNORMAL LOW (ref 135–145)

## 2019-12-17 LAB — VITAMIN C: Vitamin C: 0.1 mg/dL — ABNORMAL LOW (ref 0.4–2.0)

## 2019-12-17 MED ORDER — LIDOCAINE-PRILOCAINE 2.5-2.5 % EX CREA
1.0000 | TOPICAL_CREAM | CUTANEOUS | Status: DC | PRN
Start: 2019-12-17 — End: 2019-12-17

## 2019-12-17 MED ORDER — ALTEPLASE 2 MG IJ SOLR
INTRAMUSCULAR | Status: AC
  Filled 2019-12-17: qty 4

## 2019-12-17 MED ORDER — ALTEPLASE 2 MG IJ SOLR
INTRAMUSCULAR | Status: AC
  Administered 2019-12-17: 2 mg
  Filled 2019-12-17: qty 4

## 2019-12-17 MED ORDER — DARBEPOETIN ALFA 150 MCG/0.3ML IJ SOSY
PREFILLED_SYRINGE | INTRAMUSCULAR | Status: AC
  Filled 2019-12-17: qty 0.3

## 2019-12-17 MED ORDER — LIDOCAINE HCL (PF) 1 % IJ SOLN: 5.0000 mL | INTRAMUSCULAR | Status: DC | PRN

## 2019-12-17 MED ORDER — PROMETHAZINE HCL 25 MG/ML IJ SOLN
INTRAMUSCULAR | Status: AC
  Filled 2019-12-17: qty 1

## 2019-12-17 MED ORDER — ALTEPLASE 2 MG IJ SOLR
2.0000 mg | Freq: Once | INTRAMUSCULAR | Status: AC | PRN
  Administered 2019-12-17: 2 mg

## 2019-12-17 MED ORDER — HEPARIN SODIUM (PORCINE) 1000 UNIT/ML DIALYSIS: 1000.0000 [IU] | INTRAMUSCULAR | Status: DC | PRN

## 2019-12-17 MED ORDER — PENTAFLUOROPROP-TETRAFLUOROETH EX AERO: 1.0000 "application " | INHALATION_SPRAY | CUTANEOUS | Status: DC | PRN

## 2019-12-17 MED ORDER — SODIUM CHLORIDE 0.9 % IV SOLN: 100.0000 mL | INTRAVENOUS | Status: DC | PRN

## 2019-12-17 MED ORDER — HEPARIN SODIUM (PORCINE) 1000 UNIT/ML DIALYSIS: 20.0000 [IU]/kg | INTRAMUSCULAR | Status: DC | PRN

## 2019-12-17 MED ORDER — HEPARIN SODIUM (PORCINE) 1000 UNIT/ML IJ SOLN
INTRAMUSCULAR | Status: AC
  Administered 2019-12-17: 1000 [IU]
  Filled 2019-12-17: qty 3

## 2019-12-17 NOTE — Progress Notes (Signed)
Remaining 45mins hemodialysis machine keeps on arterial alarm. Catheter access was checked and both lumen no outflow. PA was informed and ordered to instill cath flow and resume HD if access is good.

## 2019-12-17 NOTE — Progress Notes (Signed)
Report is given to HD nurse

## 2019-12-17 NOTE — Plan of Care (Signed)

## 2019-12-17 NOTE — Progress Notes (Signed)
Returns from MRI

## 2019-12-17 NOTE — Progress Notes (Signed)
To MRI FOR RIGHT KNEE

## 2019-12-17 NOTE — Progress Notes (Signed)
Subjective:  No new c/o's. R knee hurting.  Objective Vital signs in last 24 hours: Vitals:   12/17/19 1000 12/17/19 1123 12/17/19 1200 12/17/19 1229  BP: (!) 90/35 (!) 100/37 (!) 96/24 (!) 106/42  Pulse: 65 (!) 59 65 64  Resp:    18  Temp:    (!) 97.5 F (36.4 C)  TempSrc:    Oral  SpO2:    100%  Weight:    118.2 kg  Height:       Weight change:   Physical Exam General sitting up in chair, alert,chronically ill appearing male, NAD Heart:RRR, +1/6 systolic murmur Lungs:CTAB. nml WOB Abdomen:soft, NTND, has minimal urine output in ileal conduit bag Extremities:no LE edema, L thigh and R knee dressed, Dialysis Access: R femoral TDC    OP HD: AF MWF     4h 20min  116.5kg  2/2.25 bath  R fem TDC  Hep 3700  - hect 4 ug   -mircera 150 ug last 11/29  - venofer 50/wk   - sensipar 30 q HD   Problem/Plan: 1. Right kneeeffusion -spontaneous hemarthrosis (all cultures negative so far) s/p I&D on 12/7 [facility] & synovial fluid cx's neg to date. Orthopedics following, IV Vanc/Zosyn.   Patient reports continued new pain around knee  Vas [place] completed with prelim showing no DVT or baker cyst.  Pain management/ABXper ortho/admit. 2. ESRD -HD MWF. HD per regular schedule.  Start Lasix 80 mg daily as he takes as an outpatient with reported urine output as an outpatient 3. Hyperkalemia -last K 4.6 .Follow daily,noted patient does take as outpatient Kayexalate on Saturday and Sundays, in hospital will use Lokelma if neededover weekend. Checking labs pre HD. 4. Hypertension/volume -BPok.Close to dry weight, does not appear volume overloaded. UF as tolerated.  5. Anemia -last Hgb8.9,continue ESA on schedule- next due 12/13, aranesp 18mcg ordered,hold his weekly iron secondary to infected knee 6. Metabolic bone disease -Caand phos in goal.continue binders andVDRA. 7. Nutrition -renal dietw/fluid restrictions  [name], MD 12/17/2019, 3:28 PM      Labs: Basic Metabolic  Panel: Recent Labs  Lab 12/13/19 0459 12/14/19 0306 12/17/19 0810  NA 134* 135 132*  K 5.4* 4.6 7.0*  CL 96* 98 95*  CO2 25 24 23   GLUCOSE 100* 118* 87  BUN 61* 44* 88*  CREATININE 8.89* 7.12* 11.23*  CALCIUM 9.4 9.3 9.3  PHOS 4.1 4.2 7.0*   Liver Function Tests: Recent Labs  Lab 12/13/19 0459 12/14/19 0306 12/17/19 0810  ALBUMIN 2.8* 2.8* 3.2*   No results for input(s): LIPASE, AMYLASE in the last 168 hours. No results for input(s): AMMONIA in the last 168 hours. CBC: Recent Labs  Lab 12/11/19 0257 12/11/19 2045 12/12/19 0700 12/13/19 0459 12/17/19 0809  WBC 6.4 7.5 5.9 5.9 5.6  HGB 9.0* 9.3* 9.3* 8.9* 8.7*  HCT 29.6* 31.1* 29.6* 29.4* 29.3*  MCV 96.4 96.3 95.8 96.4 96.7  PLT 134* 168 152 148* 185   Cardiac Enzymes: No results for input(s): CKTOTAL, CKMB, CKMBINDEX, TROPONINI in the last 168 hours. CBG: No results for input(s): GLUCAP in the last 168 hours.  Studies/Results: No results found. Medications:  . acetaminophen  650 mg Oral Q8H  . alteplase      . calcium acetate  667 mg Oral TID WC  . Chlorhexidine Gluconate Cloth  6 each Topical Q0600  . cinacalcet  30 mg Oral Q M,W,F-HD  . clopidogrel  75 mg Oral Daily  . Darbepoetin Alfa      . [  START ON 12/24/2019] darbepoetin (ARANESP) injection - DIALYSIS  150 mcg Intravenous Q Wed-HD  . escitalopram  5 mg Oral Daily  . famotidine  20 mg Oral Daily  . feeding supplement (NEPRO CARB STEADY)  237 mL Oral Daily  . furosemide  80 mg Oral Daily  . heparin injection (subcutaneous)  5,000 Units Subcutaneous Q8H  . levothyroxine  137 mcg Oral QAC breakfast  . promethazine      . rosuvastatin  10 mg Oral Daily  . traZODone  100 mg Oral QHS  . zolpidem  10 mg Oral QHS

## 2019-12-17 NOTE — Progress Notes (Signed)
Remains in MRI 

## 2019-12-17 NOTE — Progress Notes (Addendum)
PT Cancellation Note  Patient Details Name: [name] MRN: [id number] DOB: Dec 17, 1965   Cancelled Treatment:    Reason Eval/Treat Not Completed: (P) Patient at procedure or test/unavailable (at HD dept.) Pt off floor 9:30AM at HD dept and again when attempted at 4:40 pm (MRI dept). Will continue efforts per PT POC as schedule permits.  [name] 12/17/2019, 9:38 AM

## 2019-12-17 NOTE — Progress Notes (Signed)
HD#8 Subjective:  Overnight Events: none  Patient seed at dialysis this morning. This morning still have right leg and calf pain. Otherwise no new complaints. Discussed orthopedics not recommending further surgical evaluation. Plan for MRI of knee today and to discuss pain medication with PCP today.  Objective:  Vital signs in last 24 hours: Vitals:   12/16/19 0530 12/16/19 1440 12/16/19 1936 12/17/19 0349  BP: (!) 102/41 (!) 94/53 111/62 (!) 114/58  Pulse: 72 67 72 66  Resp: 18 15 18 17   Temp: 99.1 F (37.3 C) 98.6 F (37 C) 98.5 F (36.9 C) 98.1 F (36.7 C)  TempSrc: Oral Oral Oral   SpO2: 98% 99% 100% 99%  Weight:      Height:       Supplemental O2: Room Air SpO2: 99 %   Physical Exam:  Physical Exam Constitutional:      Appearance: Normal appearance.  HENT:     Head: Normocephalic and atraumatic.  Eyes:     Extraocular Movements: Extraocular movements intact.  Cardiovascular:     Rate and Rhythm: Normal rate.  Abdominal:     General: Bowel sounds are normal.     Palpations: Abdomen is soft.     Tenderness: There is no abdominal tenderness.  Musculoskeletal:        General: Swelling (right knee, stable) and tenderness (right knee) present.     Cervical back: Normal range of motion.     Right lower leg: No edema.     Left lower leg: No edema.     Comments: Small area of erythema of lateral calf   Skin:    General: Skin is warm and dry.  Neurological:     Mental Status: He is alert and oriented to person, place, and time. Mental status is at baseline.     Motor: Weakness: generalized weakness.  Psychiatric:        Mood and Affect: Mood normal.    Filed Weights   12/10/19 1215 12/12/19 0815 12/12/19 1129  Weight: 117.6 kg 120.4 kg 118 kg    No intake or output data in the 24 hours ending 12/17/19 0557 Net IO Since Admission: -7,303 mL [12/17/19 0557]  Pertinent Labs: CBC Latest Ref Rng & Units 12/13/2019 12/12/2019 12/11/2019  WBC 4.0 - 10.5 K/uL  5.9 5.9 7.5  Hemoglobin 13.0 - 17.0 g/dL 8.9(L) 9.3(L) 9.3(L)  Hematocrit 39.0 - 52.0 % 29.4(L) 29.6(L) 31.1(L)  Platelets 150 - 400 K/uL 148(L) 152 168    CMP Latest Ref Rng & Units 12/14/2019 12/13/2019 12/12/2019  Glucose 70 - 99 mg/dL 118(H) 100(H) 92  BUN 6 - 20 mg/dL 44(H) 61(H) 72(H)  Creatinine 0.61 - 1.24 mg/dL 7.12(H) 8.89(H) 9.01(H)  Sodium 135 - 145 mmol/L 135 134(L) 134(L)  Potassium 3.5 - 5.1 mmol/L 4.6 5.4(H) 5.6(H)  Chloride 98 - 111 mmol/L 98 96(L) 91(L)  CO2 22 - 32 mmol/L 24 25 27   Calcium 8.9 - 10.3 mg/dL 9.3 9.4 9.1  Total Protein 6.5 - 8.1 g/dL - - -  Total Bilirubin 0.3 - 1.2 mg/dL - - -  Alkaline Phos 38 - 126 U/L - - -  AST 15 - 41 U/L - - -  ALT 0 - 44 U/L - - -    Imaging: No results found.  Assessment/Plan:   Principal Problem:   Hemarthrosis, right knee Active Problems:   ESRD on dialysis [facility])  Patient Summary: [name] is a 54 y/o gentleman with history of ESRD on  HD, HTN, nonhealing surgical wounds from prior AV grafts, recent history of septic arthritis in left knee, severe AS, CAD,spina bifida with Myelomeningocele who presents with progressive right knee pain that began 4 days agoadmitted for concern of septic arthritis, on further workup suspect spontaneous hemarthrosis.  Right knee effusion like due tospontaneoushemarthrosis Leg and knee pain remain the same. Will have MRI today. Orthopedics reevaluated yesterday, do not think there is indication for surgical intervention.  - MRI right knee - Continue to work with PT while in hospital - Oxycodone 10 mg q4 PRN for pain - Increased dilaudid to 1.5 mg q2prnbreakthroughpain, phenergan prn nausea  ESRD on HD Dialysis today -appreciate nephrology consult for inpatient HD management.  - monitor renal function - MWF dialysis   Chronic anxiety -continue hom lexapro and ativan   CAD with unstable angina/3-vessel disease  Severe Aortic stenosis -Continued statin and  plavix  DVT ppx: heparin Diet: Renal  CODE: FULL  Dispo: Anticipated discharge to Home pending medical mangement.  [name] 5:57 AM, 12/17/2019  Pager [phone]  Please contact the on call pager after 5 pm and on weekends at [phone].

## 2019-12-18 DIAGNOSIS — M009 Pyogenic arthritis, unspecified: Principal | ICD-10-CM

## 2019-12-18 MED ORDER — MIDODRINE HCL 5 MG PO TABS
5.0000 mg | ORAL_TABLET | ORAL | Status: DC
  Administered 2019-12-19: 5 mg via ORAL

## 2019-12-18 MED ORDER — SODIUM CHLORIDE 0.9 % IV SOLN: 125.0000 mg | INTRAVENOUS | Status: DC

## 2019-12-18 MED ORDER — HYDROMORPHONE HCL 1 MG/ML IJ SOLN: 1.0000 mg | INTRAMUSCULAR | Status: DC | PRN

## 2019-12-18 MED ORDER — SODIUM CHLORIDE 0.9 % IV SOLN: 50.0000 mg | INTRAVENOUS | Status: DC

## 2019-12-18 MED ORDER — FENTANYL 75 MCG/HR TD PT72
1.0000 | MEDICATED_PATCH | TRANSDERMAL | Status: DC
  Administered 2019-12-18: 1 via TRANSDERMAL
  Filled 2019-12-18: qty 1

## 2019-12-18 MED ORDER — DICLOFENAC SODIUM 1 % EX GEL
2.0000 g | Freq: Two times a day (BID) | CUTANEOUS | Status: DC
  Administered 2019-12-18 – 2019-12-19 (×3): 2 g via TOPICAL
  Filled 2019-12-18: qty 100

## 2019-12-18 MED ORDER — CALCIUM ACETATE (PHOS BINDER) 667 MG PO CAPS
2001.0000 mg | ORAL_CAPSULE | Freq: Three times a day (TID) | ORAL | Status: DC
  Administered 2019-12-18 – 2019-12-19 (×3): 2001 mg via ORAL
  Filled 2019-12-18 (×5): qty 3

## 2019-12-18 MED ORDER — HYDROMORPHONE HCL 1 MG/ML IJ SOLN
1.0000 mg | INTRAMUSCULAR | Status: AC | PRN
  Administered 2019-12-18 (×3): 1 mg via INTRAVENOUS
  Filled 2019-12-18 (×3): qty 1

## 2019-12-18 NOTE — Progress Notes (Addendum)
HD#9 Subjective:  Overnight Events: none  Continues to have pain in the right knee with swelling, states pain is improving today with less pain in the leg. Reviewed MRI results and orthopedic surgery recommendation. He states lidocaine patches does not work well for him and would prefer trying a fentanyl patch and taper down with his PCP has he has done in the past.  Also concerned that he is concerned about his BP being low. Does note it has been measured on the left side which tends to be lower. Denies dizziness with sitting or standing. Eating well, with normal bowel movements. Able to transfer from bed without assistance.   Objective:  Vital signs in last 24 hours: Vitals:   12/18/19 0339 12/18/19 0801 12/18/19 0953 12/18/19 1245  BP: (!) 91/58 (!) 92/51  112/61  Pulse: 66 63  68  Resp: 16 17  17   Temp: 98.5 F (36.9 C) 98.8 F (37.1 C)  99.2 F (37.3 C)  TempSrc: Oral Oral  Oral  SpO2: 98% 100% 99% 100%  Weight:      Height:       Supplemental O2: Room Air SpO2: 100 %   Physical Exam:  Physical Exam Constitutional:      Appearance: Normal appearance.  HENT:     Head: Normocephalic and atraumatic.  Eyes:     Extraocular Movements: Extraocular movements intact.  Cardiovascular:     Rate and Rhythm: Normal rate.  Abdominal:     General: Bowel sounds are normal.     Palpations: Abdomen is soft.     Tenderness: There is no abdominal tenderness.  Musculoskeletal:        General: Swelling (right knee, stable) and tenderness (right knee) present.     Cervical back: Normal range of motion.     Right lower leg: No edema.     Left lower leg: No edema.     Comments: No erythema no warmth   Skin:    General: Skin is warm and dry.  Neurological:     Mental Status: He is alert and oriented to person, place, and time. Mental status is at baseline.     Motor: Weakness: generalized weakness.  Psychiatric:        Mood and Affect: Mood normal.    Filed Weights    12/12/19 1129 12/17/19 0736 12/17/19 1229  Weight: 118 kg 121 kg 118.2 kg     Intake/Output Summary (Last 24 hours) at 12/18/2019 1251 Last data filed at 12/18/2019 0500 Gross per 24 hour  Intake 120 ml  Output 0 ml  Net 120 ml   Net IO Since Admission: -9,063 mL [12/18/19 1251]  Pertinent Labs: CBC Latest Ref Rng & Units 12/17/2019 12/13/2019 12/12/2019  WBC 4.0 - 10.5 K/uL 5.6 5.9 5.9  Hemoglobin 13.0 - 17.0 g/dL 8.7(L) 8.9(L) 9.3(L)  Hematocrit 39.0 - 52.0 % 29.3(L) 29.4(L) 29.6(L)  Platelets 150 - 400 K/uL 185 148(L) 152    CMP Latest Ref Rng & Units 12/17/2019 12/14/2019 12/13/2019  Glucose 70 - 99 mg/dL 87 118(H) 100(H)  BUN 6 - 20 mg/dL 88(H) 44(H) 61(H)  Creatinine 0.61 - 1.24 mg/dL 11.23(H) 7.12(H) 8.89(H)  Sodium 135 - 145 mmol/L 132(L) 135 134(L)  Potassium 3.5 - 5.1 mmol/L 7.0(HH) 4.6 5.4(H)  Chloride 98 - 111 mmol/L 95(L) 98 96(L)  CO2 22 - 32 mmol/L 23 24 25   Calcium 8.9 - 10.3 mg/dL 9.3 9.3 9.4  Total Protein 6.5 - 8.1 g/dL - - -  Total Bilirubin 0.3 - 1.2 mg/dL - - -  Alkaline Phos 38 - 126 U/L - - -  AST 15 - 41 U/L - - -  ALT 0 - 44 U/L - - -    Imaging: MR KNEE RIGHT WO CONTRAST  Result Date: 12/18/2019 CLINICAL DATA:  Right knee pain, hemarthrosis EXAM: MRI OF THE RIGHT KNEE WITHOUT CONTRAST TECHNIQUE: Multiplanar, multisequence MR imaging of the knee was performed. No intravenous contrast was administered. COMPARISON:  X-ray 12/09/2019 FINDINGS: Technical note: Motion degraded exam. MENISCI Medial meniscus:  Intact. Lateral meniscus: Intrasubstance degeneration of the lateral meniscal body with suspected degenerative tearing extending to the inferior articular surface (series 11, images 10-13). LIGAMENTS Cruciates:  Intact ACL and PCL. Collaterals: Intact MCL. Thickened, heterogeneous appearance of the fibular collateral ligament suggesting sequela of remote trauma. Lateral collateral ligament complex otherwise intact. CARTILAGE Patellofemoral: Numerous  irregular cartilage fissures of the patella including prominent chondral delamination with flap component at the mid patella (series 9, image 27). Surface irregularity within the inferior trochlea. Medial:  No chondral defect. Lateral:  Mild chondral thinning without focal defect. Joint: Small-moderate joint effusion without layering fluid-fluid or fat-fluid levels. There is some synovitis evident. Fat pads within normal limits. Popliteal Fossa:  No Baker cyst. Intact popliteus tendon. Extensor Mechanism:  Intact quadriceps tendon and patellar tendon. 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 Osseous structures are otherwise within normal limits. No acute fracture or dislocation. No bony erosion or marrow replacement. Other: Nonspecific subcutaneous edema, most pronounced at the anterior aspect of the knee. IMPRESSION: 1. Small-to-moderate joint effusion with synovitis. Findings are nonspecific and may be reactive/degenerative. Consider arthrocentesis if there is high clinical suspicion for septic arthritis. 2. Intrasubstance degeneration of the lateral meniscal body with suspected degenerative tearing extending to the inferior articular surface. 3. 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 Numerous irregular cartilage fissures of the patella including prominent chondral delamination with flap component at the mid patella. Electronically Signed   By: Davina Poke D.O.   On: 12/18/2019 08:14    Assessment/Plan:   Principal Problem:   Hemarthrosis, right knee Active Problems:   ESRD on dialysis [facility])  Patient Summary: [name] is a 54 y/o gentleman with history of ESRD on HD, HTN, nonhealing surgical wounds from prior AV grafts, recent history of septic arthritis in left knee, severe AS, CAD,spina bifida with Myelomeningocele who presents with progressive right knee  pain that began 4 days agoadmitted for concern of septic arthritis, on further workup suspect spontaneous hemarthrosis complicated by continued knee pain radiating down leg.  Right knee effusion like due tospontaneoushemarthrosis MRI of knee yesterday with effusion and chronic degenerative changes. Will try starting on fentanyl patches for pain control and will start triturating down IV pain medication - Continue to work with PT while in hospital - Oxycodone 10 mg q4 PRN for pain - Decrease to dilaudid to 1 mg q2prn forbreakthroughpain, with decrease to q4 tonight - Fentanyl 75 mcg/hr - Discussed discharge planning with PCP who previously wean patient off fentanyl earlier this year and will arrange close follow up early next week.  Hypotension BP in 90s over 50s. Patient is asymptomatic at rest and with activity. PT notes BP typically higher on the right side. Improve BP when measure on right of about 100/60. Orthostatics with approximate increase in BP with sitting.   ESRD on HD -appreciate nephrology consult  for inpatient HD management.  - monitor renal function - MWF dialysis   Chronic anxiety -continue hom lexapro and ativan   CAD with unstable angina/3-vessel disease  Severe Aortic stenosis -Continued statin and plavix  DVT ppx: heparin Diet: Renal  CODE: FULL  Dispo: Anticipated discharge to Home tomorrow.  [name] 12:51 PM, 12/18/2019  Pager [phone]  Please contact the on call pager after 5 pm and on weekends at [phone].

## 2019-12-18 NOTE — Plan of Care (Signed)

## 2019-12-18 NOTE — Plan of Care (Signed)

## 2019-12-18 NOTE — Progress Notes (Signed)
Occupational Therapy Treatment Patient Details Name: [name] MRN: [id number] DOB: Feb 19, 1965 Today's Date: 12/18/2019    History of present illness 54 y/o gentleman with history of ESRD on HD, HTN, nonhealing surgical wounds on LLE from prior AV grafts, recent history of septic arthritis in left knee, severe AS, CAD, spina bifida with  Myelomeningocele, L great toe amputation, nephrostomy, anxiety who presents with progressive right knee pain that began 4 days ago. s/p I&D RLE due to spontaneous hemarthrosis vs septic arthosis.   OT comments  Patient continues to remain limited in progress towards goals in skilled OT session due to significant R knee pain. Patient's session encompassed therapeutic exercises, bed mobility, and pain management strategies including stretching (inversion and eversion to RLE with inversion preferred). Pt requesting stretching to RLE, with notable relief from pain with each stretch. Provided leg lifter to allow pt to complete stretches independently with pt able to demonstrate at end of session. Ice pack applied to knee at end of session as well. Discharge remains appropriate, therapy will continue to follow.    Follow Up Recommendations  No OT follow up;Supervision - Intermittent (consider postacute rehab if pain still a limiting factor)    Equipment Recommendations  None recommended by OT (appears well equipped; encouraged pt to look into AE for LB ADLs)    Recommendations for Other Services      Precautions / Restrictions Precautions Precautions: Fall Restrictions Weight Bearing Restrictions: Yes RLE Weight Bearing: Weight bearing as tolerated LLE Weight Bearing: Weight bearing as tolerated Other Position/Activity Restrictions: 12/16: R knee MRI, still WBAT       Mobility Bed Mobility Overal bed mobility: Needs Assistance Bed Mobility: Supine to Sit;Sit to Supine     Supine to sit: Supervision;HOB elevated Sit to supine: Supervision;HOB  elevated   General bed mobility comments: increased time, needs assist at times to move objects around in bed (removing pillow under legs, etc)  Transfers                 General transfer comment: deferred due to pain    Balance Overall balance assessment: Needs assistance Sitting-balance support: No upper extremity supported;Feet supported Sitting balance-Leahy Scale: Good Sitting balance - Comments: no LOB seated EOB, able to sit with hands in lap but at times needs U UE for steadying                                   ADL either performed or assessed with clinical judgement   ADL                                         General ADL Comments: Session focus on therapuetic exercises and stretching to alleviate pain     Vision       Perception     Praxis      Cognition Arousal/Alertness: Awake/alert Behavior During Therapy: WFL for tasks assessed/performed;Anxious Overall Cognitive Status: Within Functional Limits for tasks assessed                                 General Comments: a bit frustrated with R knee pain, agreeable to mobility with encouragement.        Exercises General Exercises - Lower Extremity Ankle Circles/Pumps: AROM;Strengthening;10 reps;Both;Supine  Heel Slides: AROM;Strengthening;Both;10 reps;Supine Straight Leg Raises: AROM;Strengthening;Both;10 reps;Supine Other Exercises Other Exercises: stretching completed to RLE inversion and eversion per pt request, inversion noted to have significant release of pain   Shoulder Instructions       General Comments      Pertinent Vitals/ Pain       Pain Score: 9  Pain Location: R knee Pain Descriptors / Indicators: Grimacing;Guarding;Sore;Moaning Pain Intervention(s): Limited activity within patient's tolerance;Monitored during session;Repositioned;Premedicated before session;Ice applied  Home Living                                           Prior Functioning/Environment              Frequency  Min 2X/week        Progress Toward Goals  OT Goals(current goals can now be found in the care plan section)  Progress towards OT goals: Progressing toward goals  Acute Rehab OT Goals Patient Stated Goal: pain control, go home OT Goal Formulation: With patient Time For Goal Achievement: 12/25/19 Potential to Achieve Goals: Good  Plan Discharge plan needs to be updated    Co-evaluation                 AM-PAC OT "6 Clicks" Daily Activity     Outcome Measure   Help from another person eating meals?: None Help from another person taking care of personal grooming?: A Little Help from another person toileting, which includes using toliet, bedpan, or urinal?: A Little Help from another person bathing (including washing, rinsing, drying)?: A Little Help from another person to put on and taking off regular upper body clothing?: A Little Help from another person to put on and taking off regular lower body clothing?: A Little 6 Click Score: 19    End of Session Equipment Utilized During Treatment: Other (comment) (leg lifter)  OT Visit Diagnosis: Unsteadiness on feet (R26.81);Other abnormalities of gait and mobility (R26.89);Pain Pain - Right/Left: Right Pain - part of body: Knee   Activity Tolerance Patient limited by pain   Patient Left in bed;with call bell/phone within reach   Nurse Communication Mobility status        Time: 9628-3662 OT Time Calculation (min): 40 min  Charges: OT General Charges $OT Visit: 1 Visit OT Treatments $Self Care/Home Management : 8-22 mins $Therapeutic Exercise: 23-37 mins  Corinne Ports E. Raniya Golembeski, COTA/L Acute Rehabilitation Services [place] 12/18/2019, 5:04 PM

## 2019-12-18 NOTE — Progress Notes (Addendum)
Orthopaedic Trauma Service Progress Note  Patient ID: [name] MRN: [id number] DOB/AGE: [birth year] 54 y.o.  Subjective:  R knee sore but pt looks very comfortable Afebrile No elevated wbc count yesterday   MRI R knee reviewed  + degenerative changes  Lateral meniscal tear likely related to DJD  Patellofemoral arthritis   No acute issues that need to be surgically addressed identified  Intra op cultures negative   ROS As above  Objective:   VITALS:   Vitals:   12/17/19 1302 12/17/19 2003 12/18/19 0339 12/18/19 0801  BP: 108/62 (!) 92/55 (!) 91/58 (!) 92/51  Pulse: 68 73 66 63  Resp: 18 16 16 17   Temp: 97.9 F (36.6 C) 99.7 F (37.6 C) 98.5 F (36.9 C) 98.8 F (37.1 C)  TempSrc: Oral Oral Oral Oral  SpO2: 99% 98% 98% 100%  Weight:      Height:        Estimated body mass index is 36.34 kg/m as calculated from the following:   Height as of this encounter: 5\' 11"  (1.803 m).   Weight as of this encounter: 118.2 kg.   Intake/Output      12/15 0701 12/16 0700 12/16 0701 12/17 0700   P.O. 120    Total Intake(mL/kg) 120 (1)    Urine (mL/kg/hr) 0 (0)    Other 2000    Total Output 2000    Net -1880           LABS  No results found for this or any previous visit (from the past 24 hour(s)).   PHYSICAL EXAM:   Gen: in bed, sitting up, NAD  Ext:       Right Lower Extremity              Dressings c/d/i to right knee, incisions healing nicely              Effusion still present, stable and expected              No changes in motor or sensory functions              No pitting edema   Lower leg muscle atrophy B   Skin changes with PVD noted B (hemosiderin staining)  + flexion contracture but resting with knee in about 70 degrees of flexion        Assessment/Plan: 9 Days Post-Op   Principal Problem:   Hemarthrosis, right knee Active Problems:   ESRD on dialysis  ([place])   Anti-infectives (From admission, onward)   Start     Dose/Rate Route Frequency Ordered Stop   12/12/19 1200  vancomycin (VANCOCIN) IVPB 1000 mg/200 mL premix  Status:  Discontinued        1,000 mg 200 mL/hr over 60 Minutes Intravenous Every M-W-F (Hemodialysis) 12/11/19 0834 12/11/19 0925   12/10/19 1200  vancomycin (VANCOCIN) IVPB 500 mg/100 ml premix        500 mg 100 mL/hr over 60 Minutes Intravenous Every Wed (Hemodialysis) 12/10/19 1031 12/10/19 1150   12/10/19 1042  vancomycin (VANCOCIN) 500-5 MG/100ML-% IVPB       Note to Pharmacy: [name]   : cabinet override      12/10/19 1042 12/10/19 2244   12/09/19 1930  vancomycin (VANCOREADY) IVPB 1500 mg/300 mL  1,500 mg 150 mL/hr over 120 Minutes Intravenous  Once 12/09/19 1847 12/10/19 0022   12/09/19 1849  vancomycin variable dose per unstable renal function (pharmacist dosing)  Status:  Discontinued         Does not apply See admin instructions 12/09/19 1849 12/11/19 0834   12/09/19 1730  piperacillin-tazobactam (ZOSYN) IVPB 2.25 g  Status:  Discontinued        2.25 g 100 mL/hr over 30 Minutes Intravenous Every 8 hours 12/09/19 1702 12/11/19 0925   12/09/19 1545  ceFAZolin (ANCEF) IVPB 2g/100 mL premix        2 g 200 mL/hr over 30 Minutes Intravenous On call to O.R. 12/09/19 1514 12/09/19 1600   12/09/19 1525  ceFAZolin (ANCEF) 2-4 GM/100ML-% IVPB       Note to Pharmacy: [name]   : cabinet override      12/09/19 1525 12/09/19 1612   12/09/19 0930  vancomycin (VANCOCIN) IVPB 1000 mg/200 mL premix        1,000 mg 200 mL/hr over 60 Minutes Intravenous  Once 12/09/19 0930 12/09/19 1120    .  POD/HD#: 107  54 y/o male with complex medical history with acute R knee pain, L groin wound infection and L knee septic arthritis 2 months ago     - R knee spontaneous hemarthrosis vs septic arthritis s/p I&D             cultures negative   MRI notable for degenerative changes              Intra-op appears more  hemarthrosis than septic joint                               no indications to return to OR                WBAT R leg with assistance             ROM as tolerated R knee             PT/OT evals             continue to ice after therapy    Ok to start using heat before activity after course of diclofenac topical    In theory heat alters the drugs permeability              TED hose ok if pt desires                Dressing changes as needed                     Dc sutures in 5-7 days    Offered lidocaine patch or topical NSAID   Pt would like to try topical diclofenac x 7 days.  Will do BID application instead of QID     - Dispo:             Per other services             Ortho issues stable             Follow up in 2 weeks         Darrell Pigg, PA-C [phone] (C) 12/18/2019, 10:01 AM  [facility] [place] [phone] [name] (F)    After 5pm and on the weekends please log on to Amion, go to orthopaedics and  the look under the [facility] Call for the provider(s) on call. You can also call our office at [phone] and then follow the prompts to be connected to the call team.

## 2019-12-18 NOTE — Progress Notes (Signed)
Physical Therapy Treatment Patient Details Name: [name] MRN: [id number] DOB: Aug 28, 1965 Today's Date: 12/18/2019    History of Present Illness 54 y/o gentleman with history of ESRD on HD, HTN, nonhealing surgical wounds on LLE from prior AV grafts, recent history of septic arthritis in left knee, severe AS, CAD, spina bifida with  Myelomeningocele, L great toe amputation, nephrostomy, anxiety who presents with progressive right knee pain that began 4 days ago. s/p I&D RLE due to spontaneous hemarthrosis vs septic arthosis.    PT Comments    Pt supine on arrival, anxious but agreeable to therapy session, pt with fair participation and tolerance for mobility and remains limited due to severe R knee pain at rest and with mobility. Pt very pain limited and needed min guard for safety with sit<>stand from very elevated bed height to bari RW and unable to remain standing >3 minutes for second standing orthostatic BP assessment (RN present to assist with manual BP assessment for safety while pt standing). Pt continues to benefit from PT services to progress toward functional mobility goals. D/C recs below, pending progress; pt encouraged to continue supine/seated therex per HEP as tolerated and deferred further mobility once up in chair due to pain. Will attempt to progress gait distance next session. Orthostatic BPs  Supine (taken RUE manual) 98/52; 60's bpm  Sitting 104/66; HR 69 bpm  Standing 106/72;  HR 71 bpm      Follow Up Recommendations  Home health PT;Supervision for mobility/OOB (pt seems likely to refuse SNF)     Equipment Recommendations  None recommended by PT;Other (comment) (pt reports he has RW, hospital bed, WC)    Recommendations for Other Services       Precautions / Restrictions Precautions Precautions: Fall Restrictions Weight Bearing Restrictions: Yes RLE Weight Bearing: Weight bearing as tolerated LLE Weight Bearing: Weight bearing as tolerated Other  Position/Activity Restrictions: 12/16: R knee MRI, still WBAT    Mobility  Bed Mobility Overal bed mobility: Needs Assistance Bed Mobility: Supine to Sit     Supine to sit: Supervision;HOB elevated     General bed mobility comments: increased time, needs assist at times to move objects around in bed (removing pillow under legs, etc)  Transfers Overall transfer level: Needs assistance Equipment used: Rolling walker (2 wheeled) Transfers: Sit to/from Stand Sit to Stand: Min guard;From elevated surface         General transfer comment: increased time/effort to perform; x2 reps from very elevated bed height to RW  Ambulation/Gait Ambulation/Gait assistance: Min guard Gait Distance (Feet): 18 Feet Assistive device: Rolling walker (2 wheeled) (bariatric RW) Gait Pattern/deviations: Step-through pattern;Decreased stride length;Trunk flexed;Wide base of support (crouched posture and B feet plantarflexor contracture 2/2 spina bifida) Gait velocity: decr   General Gait Details: Min guard for safety, pt ambulating with forward flexed posture and wide BOS/outtoeing   Stairs             Wheelchair Mobility    Modified Rankin (Stroke Patients Only)       Balance Overall balance assessment: Needs assistance Sitting-balance support: No upper extremity supported;Feet supported Sitting balance-Leahy Scale: Good Sitting balance - Comments: no LOB seated EOB, able to sit with hands in lap but at times needs U UE for steadying   Standing balance support: Bilateral upper extremity supported;During functional activity Standing balance-Leahy Scale: Poor Standing balance comment: reliant on RW and min guard for safety  Cognition Arousal/Alertness: Awake/alert Behavior During Therapy: WFL for tasks assessed/performed;Anxious Overall Cognitive Status: Within Functional Limits for tasks assessed                                  General Comments: a bit frustrated with R knee pain, agreeable to mobility with encouragement.      Exercises General Exercises - Lower Extremity Ankle Circles/Pumps: AROM;Strengthening;10 reps;Both;Supine    General Comments General comments (skin integrity, edema, etc.): see orthostatics taken at top of note; increased yellow drainage noted to L mid-back and RN notified, pt needs a dressing to cover the site.      Pertinent Vitals/Pain Pain Assessment: 0-10 Pain Score: 8  Pain Location: R knee Pain Descriptors / Indicators: Grimacing;Guarding;Sore;Moaning Pain Intervention(s): Monitored during session;Premedicated before session;Repositioned;Ice applied    Home Living                      Prior Function            PT Goals (current goals can now be found in the care plan section) Acute Rehab PT Goals Patient Stated Goal: pain control, go home PT Goal Formulation: With patient Time For Goal Achievement: 12/24/19 Potential to Achieve Goals: Fair Progress towards PT goals: Progressing toward goals (slow progress)    Frequency    Min 5X/week      PT Plan Current plan remains appropriate    Co-evaluation              AM-PAC PT "6 Clicks" Mobility   Outcome Measure  Help needed turning from your back to your side while in a flat bed without using bedrails?: A Little Help needed moving from lying on your back to sitting on the side of a flat bed without using bedrails?: A Little Help needed moving to and from a bed to a chair (including a wheelchair)?: A Little Help needed standing up from a chair using your arms (e.g., wheelchair or bedside chair)?: A Little Help needed to walk in hospital room?: A Little Help needed climbing 3-5 steps with a railing? : Total 6 Click Score: 16    End of Session Equipment Utilized During Treatment: Gait belt Activity Tolerance: Patient limited by pain (fair tolerance) Patient left: in chair;with call bell/phone  within reach;with chair alarm set (ice to R knee, NT notified pt needs new bed sheets) Nurse Communication: Mobility status;Patient requests pain meds PT Visit Diagnosis: Other abnormalities of gait and mobility (R26.89);Muscle weakness (generalized) (M62.81)     Time: 0071-2197 PT Time Calculation (min) (ACUTE ONLY): 23 min  Charges:  $Therapeutic Activity: 23-37 mins                     Trenity Pha P., PTA Acute Rehabilitation Services Pager: [phone] Office: [place] 12/18/2019, 11:30 AM

## 2019-12-18 NOTE — Progress Notes (Addendum)
Subjective: Reports improving knee discomfort, tolerated dialysis yesterday.   Continued blood flow difficulties with his femoral PermCath but thinks will tolerate better with recliner for PermCath angle to work better. Also attempt midodrine predialysis, remote past had minor headache taking it but will willing to try again. Also states he is not symp. unless his systolic is below 90  Objective Vital signs in last 24 hours: Vitals:   12/17/19 2003 12/18/19 0339 12/18/19 0801 12/18/19 0953  BP: (!) 92/55 (!) 91/58 (!) 92/51   Pulse: 73 66 63   Resp: 16 16 17    Temp: 99.7 F (37.6 C) 98.5 F (36.9 C) 98.8 F (37.1 C)   TempSrc: Oral Oral Oral   SpO2: 98% 98% 100% 99%  Weight:      Height:       Weight change:    Physical Exam General  alert, NAD Heart:RRR, +8/4 systolic murmur Lungs:CTAB. nml WOB Abdomen:soft, NTND, has minimal urine output in ileal conduit bag Extremities:no LE edema, L thigh and R knee dressed, Dialysis Access:R femoral TDC   OP HD: AF MWF     4h 50min  116.5kg  2/2.25 bath  R fem TDC  Hep 3700  - hect 4 ug   -mircera 150 ug last 11/29  - venofer 50/wk   - sensipar 30 q HD   Problem/Plan: 1. Right kneeeffusion -spontaneous hemarthrosis (all cultures negative so far) s/p I&D on 12/7 [facility] & synovial fluid cx's neg to date. Orthopedics following, now off  IV Vanc/Zosyn. Pain management/ABXper ortho/admit. 2. ESRD -HD MWF.HD per regular schedule.   Have started  Lasix 80 mg daily as he takes as an outpatient with reported urine output as an outpatient, also attempt hemodialysis in recliner. He gets Cathflo every treatment to femoral PermCath 3. Hyperkalemia -lastK 7.0 predialysis yesterday used 2K bath and 1K bath for at least an hour.Follow-up labs predialysis tomorrow.has had some  3-hour dialysis treatments with low blood flow (due to staffing issues/ emergent patient's) attempt 4 hours tomorrow ,noted patient does take as outpatient  Kayexalate on Saturday and Sundays, in hospital will use Lokelma if needed. 4. Hypertension/volume -BPok for him again ,Asymp unless sbp <90 .Close to dry weight, does not appear volume overloaded. UF as tolerated. Add back midodrine 5 mg predialysis as above 5. Anemia-lastHgb8.9 > 8.7,continue ESA on schedule-  aranesp 126mcg given 12/15 weekly iron restart as no infection evident on cultures 6. Metabolic bone disease -Ca Corec  9.9 /and phos 7.0.continue binders andVDRA. Sensipar on dialysis, calcium acetate 1 each meal increased to 3 with phosphorus up 7. Nutrition -renal dietw/fluid restrictions    Ernest Haber, PA-C [facility] Beeper [phone] 12/18/2019,11:19 AM  LOS: 9 days   Labs: Basic Metabolic Panel: Recent Labs  Lab 12/13/19 0459 12/14/19 0306 12/17/19 0810  NA 134* 135 132*  K 5.4* 4.6 7.0*  CL 96* 98 95*  CO2 25 24 23   GLUCOSE 100* 118* 87  BUN 61* 44* 88*  CREATININE 8.89* 7.12* 11.23*  CALCIUM 9.4 9.3 9.3  PHOS 4.1 4.2 7.0*   Liver Function Tests: Recent Labs  Lab 12/13/19 0459 12/14/19 0306 12/17/19 0810  ALBUMIN 2.8* 2.8* 3.2*   No results for input(s): LIPASE, AMYLASE in the last 168 hours. No results for input(s): AMMONIA in the last 168 hours. CBC: Recent Labs  Lab 12/11/19 2045 12/12/19 0700 12/13/19 0459 12/17/19 0809  WBC 7.5 5.9 5.9 5.6  HGB 9.3* 9.3* 8.9* 8.7*  HCT 31.1* 29.6* 29.4* 29.3*  MCV 96.3 95.8 96.4 96.7  PLT 168 152 148* 185   Cardiac Enzymes: No results for input(s): CKTOTAL, CKMB, CKMBINDEX, TROPONINI in the last 168 hours. CBG: No results for input(s): GLUCAP in the last 168 hours.  Studies/Results: MR KNEE RIGHT WO CONTRAST  Result Date: 12/18/2019 CLINICAL DATA:  Right knee pain, hemarthrosis EXAM: MRI OF THE RIGHT KNEE WITHOUT CONTRAST TECHNIQUE: Multiplanar, multisequence MR imaging of the knee was performed. No intravenous contrast was administered. COMPARISON:  X-ray  12/09/2019 FINDINGS: Technical note: Motion degraded exam. MENISCI Medial meniscus:  Intact. Lateral meniscus: Intrasubstance degeneration of the lateral meniscal body with suspected degenerative tearing extending to the inferior articular surface (series 11, images 10-13). LIGAMENTS Cruciates:  Intact ACL and PCL. Collaterals: Intact MCL. Thickened, heterogeneous appearance of the fibular collateral ligament suggesting sequela of remote trauma. Lateral collateral ligament complex otherwise intact. CARTILAGE Patellofemoral: Numerous irregular cartilage fissures of the patella including prominent chondral delamination with flap component at the mid patella (series 9, image 27). Surface irregularity within the inferior trochlea. Medial:  No chondral defect. Lateral:  Mild chondral thinning without focal defect. Joint: Small-moderate joint effusion without layering fluid-fluid or fat-fluid levels. There is some synovitis evident. Fat pads within normal limits. Popliteal Fossa:  No Baker cyst. Intact popliteus tendon. Extensor Mechanism:  Intact quadriceps tendon and patellar tendon. 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 Osseous structures are otherwise within normal limits. No acute fracture or dislocation. No bony erosion or marrow replacement. Other: Nonspecific subcutaneous edema, most pronounced at the anterior aspect of the knee. IMPRESSION: 1. Small-to-moderate joint effusion with synovitis. Findings are nonspecific and may be reactive/degenerative. Consider arthrocentesis if there is high clinical suspicion for septic arthritis. 2. Intrasubstance degeneration of the lateral meniscal body with suspected degenerative tearing extending to the inferior articular surface. 3. 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 Numerous irregular cartilage fissures of the patella including  prominent chondral delamination with flap component at the mid patella. Electronically Signed   By: Davina Poke D.O.   On: 12/18/2019 08:14   Medications:  . acetaminophen  650 mg Oral Q8H  . calcium acetate  667 mg Oral TID WC  . Chlorhexidine Gluconate Cloth  6 each Topical Q0600  . cinacalcet  30 mg Oral Q M,W,F-HD  . clopidogrel  75 mg Oral Daily  . [START ON 12/24/2019] darbepoetin (ARANESP) injection - DIALYSIS  150 mcg Intravenous Q Wed-HD  . diclofenac Sodium  2 g Topical BID  . escitalopram  5 mg Oral Daily  . famotidine  20 mg Oral Daily  . feeding supplement (NEPRO CARB STEADY)  237 mL Oral Daily  . fentaNYL  1 patch Transdermal Q72H  . furosemide  80 mg Oral Daily  . heparin injection (subcutaneous)  5,000 Units Subcutaneous Q8H  . levothyroxine  137 mcg Oral QAC breakfast  . rosuvastatin  10 mg Oral Daily  . traZODone  100 mg Oral QHS  . zolpidem  10 mg Oral QHS

## 2019-12-19 LAB — RENAL FUNCTION PANEL
Albumin: 3.1 g/dL — ABNORMAL LOW (ref 3.5–5.0)
Anion gap: 14 (ref 5–15)
BUN: 98 mg/dL — ABNORMAL HIGH (ref 6–20)
CO2: 25 mmol/L (ref 22–32)
Calcium: 8.9 mg/dL (ref 8.9–10.3)
Chloride: 93 mmol/L — ABNORMAL LOW (ref 98–111)
Creatinine, Ser: 11.05 mg/dL — ABNORMAL HIGH (ref 0.61–1.24)
GFR, Estimated: 5 mL/min — ABNORMAL LOW (ref >60)
Glucose, Bld: 99 mg/dL (ref 70–99)
Phosphorus: 7.3 mg/dL — ABNORMAL HIGH (ref 2.5–4.6)
Potassium: 6.7 mmol/L (ref 3.5–5.1)
Sodium: 132 mmol/L — ABNORMAL LOW (ref 135–145)

## 2019-12-19 LAB — CBC
HCT: 29.1 % — ABNORMAL LOW (ref 39.0–52.0)
Hemoglobin: 8.6 g/dL — ABNORMAL LOW (ref 13.0–17.0)
MCH: 28.6 pg (ref 26.0–34.0)
MCHC: 29.6 g/dL — ABNORMAL LOW (ref 30.0–36.0)
MCV: 96.7 fL (ref 80.0–100.0)
Platelets: 175 10*3/uL (ref 150–400)
RBC: 3.01 MIL/uL — ABNORMAL LOW (ref 4.22–5.81)
RDW: 16.5 % — ABNORMAL HIGH (ref 11.5–15.5)
WBC: 5.3 10*3/uL (ref 4.0–10.5)
nRBC: 0 % (ref 0.0–0.2)

## 2019-12-19 MED ORDER — MIDODRINE HCL 5 MG PO TABS
ORAL_TABLET | ORAL | Status: AC
  Filled 2019-12-19: qty 1

## 2019-12-19 MED ORDER — HYDROMORPHONE HCL 1 MG/ML IJ SOLN
1.0000 mg | Freq: Four times a day (QID) | INTRAMUSCULAR | Status: DC
  Administered 2019-12-19: 1 mg via INTRAVENOUS
  Filled 2019-12-19: qty 1

## 2019-12-19 MED ORDER — CINACALCET HCL 30 MG PO TABS
ORAL_TABLET | ORAL | Status: AC
  Filled 2019-12-19: qty 1

## 2019-12-19 MED ORDER — ALTEPLASE 2 MG IJ SOLR
INTRAMUSCULAR | Status: AC
  Administered 2019-12-19: 5.8 mg
  Filled 2019-12-19: qty 6

## 2019-12-19 MED ORDER — HEPARIN SODIUM (PORCINE) 1000 UNIT/ML IJ SOLN
INTRAMUSCULAR | Status: AC
  Administered 2019-12-19: 4000 [IU] via INTRAVENOUS_CENTRAL
  Filled 2019-12-19: qty 4

## 2019-12-19 MED ORDER — PROMETHAZINE HCL 25 MG/ML IJ SOLN
INTRAMUSCULAR | Status: AC
  Filled 2019-12-19: qty 1

## 2019-12-19 MED ORDER — DICLOFENAC SODIUM 1 % EX GEL: 2.0000 g | Freq: Two times a day (BID) | CUTANEOUS | 0 refills | Status: AC

## 2019-12-19 NOTE — Progress Notes (Signed)
Provided discharge education/instructions, all questions and concerns addressed, Pt not in distress, sutures removed per order. Pt to discharge home with belongings accompanied by sister.

## 2019-12-19 NOTE — Progress Notes (Signed)
[facility] Progress Note   Subjective:   Patient seen and examined at bedside during dialysis.  Continues to have pain in R knee.  Admits to tiredness.  K 6.7 today. Denies CP, palpations, SOB, and n/v/d.  Catheter working but suboptimally, BFR 250.   Objective Vitals:   12/19/19 1130 12/19/19 1135 12/19/19 1140 12/19/19 1145  BP:   (!) 102/56   Pulse:   61   Resp: 11 11 13 12   Temp:      TempSrc:      SpO2:      Weight:      Height:       Physical Exam General:Chronically ill appearing male in NAD Heart:RRR, +0/9 systolic murmur Lungs:CTAB anterolaterally  Abdomen:soft, NTND Extremities:no LE edema, L thigh dressed Dialysis Access: L TDC in use   Filed Weights   12/12/19 1129 12/17/19 0736 12/17/19 1229  Weight: 118 kg 121 kg 118.2 kg    Intake/Output Summary (Last 24 hours) at 12/19/2019 1152 Last data filed at 12/19/2019 0500 Gross per 24 hour  Intake 200 ml  Output 0 ml  Net 200 ml    Additional Objective Labs: Basic Metabolic Panel: Recent Labs  Lab 12/14/19 0306 12/17/19 0810 12/19/19 0923  NA 135 132* 132*  K 4.6 7.0* 6.7*  CL 98 95* 93*  CO2 24 23 25   GLUCOSE 118* 87 99  BUN 44* 88* 98*  CREATININE 7.12* 11.23* 11.05*  CALCIUM 9.3 9.3 8.9  PHOS 4.2 7.0* 7.3*   Liver Function Tests: Recent Labs  Lab 12/14/19 0306 12/17/19 0810 12/19/19 0923  ALBUMIN 2.8* 3.2* 3.1*   CBC: Recent Labs  Lab 12/13/19 0459 12/17/19 0809 12/19/19 0923  WBC 5.9 5.6 5.3  HGB 8.9* 8.7* 8.6*  HCT 29.4* 29.3* 29.1*  MCV 96.4 96.7 96.7  PLT 148* 185 175   Studies/Results: MR KNEE RIGHT WO CONTRAST  Result Date: 12/18/2019 CLINICAL DATA:  Right knee pain, hemarthrosis EXAM: MRI OF THE RIGHT KNEE WITHOUT CONTRAST TECHNIQUE: Multiplanar, multisequence MR imaging of the knee was performed. No intravenous contrast was administered. COMPARISON:  X-ray 12/09/2019 FINDINGS: Technical note: Motion degraded exam. MENISCI Medial meniscus:  Intact.  Lateral meniscus: Intrasubstance degeneration of the lateral meniscal body with suspected degenerative tearing extending to the inferior articular surface (series 11, images 10-13). LIGAMENTS Cruciates:  Intact ACL and PCL. Collaterals: Intact MCL. Thickened, heterogeneous appearance of the fibular collateral ligament suggesting sequela of remote trauma. Lateral collateral ligament complex otherwise intact. CARTILAGE Patellofemoral: Numerous irregular cartilage fissures of the patella including prominent chondral delamination with flap component at the mid patella (series 9, image 27). Surface irregularity within the inferior trochlea. Medial:  No chondral defect. Lateral:  Mild chondral thinning without focal defect. Joint: Small-moderate joint effusion without layering fluid-fluid or fat-fluid levels. There is some synovitis evident. Fat pads within normal limits. Popliteal Fossa:  No Baker cyst. Intact popliteus tendon. Extensor Mechanism:  Intact quadriceps tendon and patellar tendon. 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 Osseous structures are otherwise within normal limits. No acute fracture or dislocation. No bony erosion or marrow replacement. Other: Nonspecific subcutaneous edema, most pronounced at the anterior aspect of the knee. IMPRESSION: 1. Small-to-moderate joint effusion with synovitis. Findings are nonspecific and may be reactive/degenerative. Consider arthrocentesis if there is high clinical suspicion for septic arthritis. 2. Intrasubstance degeneration of the lateral meniscal body with suspected degenerative tearing extending to the inferior articular surface. 3. 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 Numerous irregular cartilage fissures of the patella including prominent chondral delamination with flap component at the mid patella. Electronically Signed   By:  Davina Poke D.O.   On: 12/18/2019 08:14    Medications: . [START ON 12/24/2019] ferric gluconate (FERRLECIT/NULECIT) IV     . acetaminophen  650 mg Oral Q8H  . calcium acetate  2,001 mg Oral TID WC  . Chlorhexidine Gluconate Cloth  6 each Topical Q0600  . cinacalcet  30 mg Oral Q M,W,F-HD  . clopidogrel  75 mg Oral Daily  . [START ON 12/24/2019] darbepoetin (ARANESP) injection - DIALYSIS  150 mcg Intravenous Q Wed-HD  . diclofenac Sodium  2 g Topical BID  . escitalopram  5 mg Oral Daily  . famotidine  20 mg Oral Daily  . feeding supplement (NEPRO CARB STEADY)  237 mL Oral Daily  . fentaNYL  1 patch Transdermal Q72H  . furosemide  80 mg Oral Daily  . heparin injection (subcutaneous)  5,000 Units Subcutaneous Q8H  .  HYDROmorphone (DILAUDID) injection  1 mg Intravenous Q6H  . levothyroxine  137 mcg Oral QAC breakfast  . midodrine  5 mg Oral Q M,W,F-HD  . promethazine      . rosuvastatin  10 mg Oral Daily  . traZODone  100 mg Oral QHS  . zolpidem  10 mg Oral QHS    Dialysis Orders: AF MWF  4h 24min 116.5kg 2/2.25 bath R fem TDC Hep 3700 - hect 4 ug -mircera 150 ug last 11/29 - venofer 50/wk  - sensipar 30 q HD  Assessment/Plan: 1. Right kneeeffusion -spontaneous hemarthrosis (all cultures negative so far) s/p I&D on 12/7 [facility] & synovial fluidcx's neg to date.Orthopedics following, now off  IV Vanc/Zosyn.Pain management/ABXper ortho/admit. 2. ESRD -HD MWF.HD per regular schedule.  Have started  Lasix 80 mg daily as he takes as an outpatient with reported urine output.  HD in recliner today.  [place] working suboptimally again today, continue cathflow to [facility] qHD. 3. Hyperkalemia-lastK 6.7 predialysis today, use low K bath.  Follow-up labs predialysis tomorrow.  Running 4hrs today, has had a few 3hr HD d/t high patient census/staffing issues. Noted patient does take as outpatient Kayexalate on Saturday and Sundays, in hospital will use Lokelma as  needed. 4. Hypertension/volume -BP stable. Asymp unless sbp <90 .Close to dry weight, does not appear volume overloaded. UF as tolerated. Add back midodrine 5 mg predialysis 5. Anemia-lastHgb8.6 today,continue ESA on schedule-  aranesp 170mcg given 12/15 weekly iron restart as no infection evident on cultures 6. Metabolic bone disease -Ca Corec  9.9 /and phos 7.3.continue binders andVDRA. Sensipar on dialysis, calcium acetate increased from 1 > 3 AC TID with ^phos 7. Nutrition -renal dietw/fluid restrictions   Jen Mow, PA-C [facility] 12/19/2019,11:52 AM  LOS: 10 days

## 2019-12-19 NOTE — Care Management Important Message (Signed)
Important Message  Patient Details  Name: [name] MRN: [id number] Date of Birth: [birth year]   Medicare Important Message Given:  Yes     Gabriana Wilmott P Kol Consuegra 12/19/2019, 2:53 PM

## 2019-12-19 NOTE — Progress Notes (Signed)
HD#10 Subjective:  Overnight Events: none  Patient reports that he continues to have right knee pain but has not been worsening. Feels ready to go home with fentanyl patch and close follow with PCP. Patient states per pain contract unable to be prescribed other opioids but by his PCP.   Objective:  Vital signs in last 24 hours: Vitals:   12/18/19 0953 12/18/19 1245 12/18/19 1945 12/19/19 0355  BP:  112/61 (!) 106/57 (!) 104/52  Pulse:  68 68 66  Resp:  17 16 16   Temp:  99.2 F (37.3 C) 98.7 F (37.1 C) 98.9 F (37.2 C)  TempSrc:  Oral Oral Oral  SpO2: 99% 100% 100% 98%  Weight:      Height:       Supplemental O2: Room Air SpO2: 98 %   Physical Exam:  Musculoskeletal: moderate effusion of the right knee, No redness, surgical clean and dressed.  Neurological: He is alert and oriented to person place and time, no lethargy, responding appropriately to conversation  [facility] Weights   12/12/19 1129 12/17/19 0736 12/17/19 1229  Weight: 118 kg 121 kg 118.2 kg     Intake/Output Summary (Last 24 hours) at 12/19/2019 0542 Last data filed at 12/18/2019 2100 Gross per 24 hour  Intake 200 ml  Output 300 ml  Net -100 ml   Net IO Since Admission: -9,163 mL [12/19/19 0542]  Pertinent Labs: CBC Latest Ref Rng & Units 12/17/2019 12/13/2019 12/12/2019  WBC 4.0 - 10.5 K/uL 5.6 5.9 5.9  Hemoglobin 13.0 - 17.0 g/dL 8.7(L) 8.9(L) 9.3(L)  Hematocrit 39.0 - 52.0 % 29.3(L) 29.4(L) 29.6(L)  Platelets 150 - 400 K/uL 185 148(L) 152    CMP Latest Ref Rng & Units 12/17/2019 12/14/2019 12/13/2019  Glucose 70 - 99 mg/dL 87 118(H) 100(H)  BUN 6 - 20 mg/dL 88(H) 44(H) 61(H)  Creatinine 0.61 - 1.24 mg/dL 11.23(H) 7.12(H) 8.89(H)  Sodium 135 - 145 mmol/L 132(L) 135 134(L)  Potassium 3.5 - 5.1 mmol/L 7.0(HH) 4.6 5.4(H)  Chloride 98 - 111 mmol/L 95(L) 98 96(L)  CO2 22 - 32 mmol/L 23 24 25   Calcium 8.9 - 10.3 mg/dL 9.3 9.3 9.4  Total Protein 6.5 - 8.1 g/dL - - -  Total Bilirubin 0.3 - 1.2  mg/dL - - -  Alkaline Phos 38 - 126 U/L - - -  AST 15 - 41 U/L - - -  ALT 0 - 44 U/L - - -    Imaging: No results found.  Assessment/Plan:   Principal Problem:   Hemarthrosis, right knee Active Problems:   ESRD on dialysis [facility])  Patient Summary: Mr. Dimalanta is a 54 y/o gentleman with history of ESRD on HD, HTN, nonhealing surgical wounds from prior AV grafts, recent history of septic arthritis in left knee, severe AS, CAD,spina bifida with Myelomeningocele who presents with progressive right knee pain that began 4 days agoadmitted for concern of septic arthritis, on further workup suspect spontaneous hemarthrosis complicated by continued knee pain radiating down leg.  Right knee effusion like due tospontaneoushemarthrosis Stable pain in the knee today. Feels ready to go home.  - Will keep him on Fentanyl 75 mcg/hr patch applied today and home percocet which he currently has at home. We set him up with a follow up appointment with his PCP on Monday to further evaluate his medications and adjust pain meds as needed. Patient was thoroughly counseled regarding his medications  ESRD on HD -appreciate nephrology consult for inpatient HD management.  -  monitor renal function - MWF dialysis   Chronic anxiety -continue hom lexapro and ativan   CAD with unstable angina/3-vessel disease  Severe Aortic stenosis -Continued statin and plavix  DVT ppx: heparin Diet: Renal  CODE: FULL  Dispo: Anticipated discharge to Home today.  Cherlynn June, D.O.  Internal Medicine Resident, PGY-2 [name]  Pager: [phone] 1:24 PM, 12/19/2019      Please contact the on call pager after 5 pm and on weekends at [phone].

## 2019-12-19 NOTE — Progress Notes (Signed)
PT Cancellation Note  Patient Details Name: [name] MRN: [id number] DOB: 09-18-1965   Cancelled Treatment:    Reason Eval/Treat Not Completed: (P) Patient at procedure or test/unavailable (HD dept). Will continue efforts per PT POC as schedule permits.   [name] 12/19/2019, 9:56 AM

## 2019-12-19 NOTE — Progress Notes (Signed)
HD#10 Subjective:  Overnight Events: none  Patient reports that he continues to have right knee pain but has not been worsening. Feels ready to go home with fentanyl patch and close follow with PCP. Patient states per pain contract unable to be prescribed other opioids but by his PCP.   Objective:  Vital signs in last 24 hours: Vitals:   12/19/19 1130 12/19/19 1135 12/19/19 1140 12/19/19 1145  BP:   (!) 102/56   Pulse:   61   Resp: 11 11 13 12   Temp:      TempSrc:      SpO2:      Weight:      Height:       Supplemental O2: Room Air SpO2: 100 %   Physical Exam:  Musculoskeletal: moderate effusion of the right knee, No redness, surgical clean and dressed.  Neurological: He is alert and oriented to person place and time, no lethargy, responding appropriately to conversation  [facility] Weights   12/12/19 1129 12/17/19 0736 12/17/19 1229  Weight: 118 kg 121 kg 118.2 kg     Intake/Output Summary (Last 24 hours) at 12/19/2019 1154 Last data filed at 12/19/2019 0500 Gross per 24 hour  Intake 200 ml  Output 0 ml  Net 200 ml   Net IO Since Admission: -8,863 mL [12/19/19 1154]  Pertinent Labs: CBC Latest Ref Rng & Units 12/19/2019 12/17/2019 12/13/2019  WBC 4.0 - 10.5 K/uL 5.3 5.6 5.9  Hemoglobin 13.0 - 17.0 g/dL 8.6(L) 8.7(L) 8.9(L)  Hematocrit 39.0 - 52.0 % 29.1(L) 29.3(L) 29.4(L)  Platelets 150 - 400 K/uL 175 185 148(L)    CMP Latest Ref Rng & Units 12/19/2019 12/17/2019 12/14/2019  Glucose 70 - 99 mg/dL 99 87 118(H)  BUN 6 - 20 mg/dL 98(H) 88(H) 44(H)  Creatinine 0.61 - 1.24 mg/dL 11.05(H) 11.23(H) 7.12(H)  Sodium 135 - 145 mmol/L 132(L) 132(L) 135  Potassium 3.5 - 5.1 mmol/L 6.7(HH) 7.0(HH) 4.6  Chloride 98 - 111 mmol/L 93(L) 95(L) 98  CO2 22 - 32 mmol/L 25 23 24   Calcium 8.9 - 10.3 mg/dL 8.9 9.3 9.3  Total Protein 6.5 - 8.1 g/dL - - -  Total Bilirubin 0.3 - 1.2 mg/dL - - -  Alkaline Phos 38 - 126 U/L - - -  AST 15 - 41 U/L - - -  ALT 0 - 44 U/L - - -     Imaging: No results found.  Assessment/Plan:   Principal Problem:   Hemarthrosis, right knee Active Problems:   ESRD on dialysis [facility])  Patient Summary: [name] is a 54 y/o gentleman with history of ESRD on HD, HTN, nonhealing surgical wounds from prior AV grafts, recent history of septic arthritis in left knee, severe AS, CAD,spina bifida with Myelomeningocele who presents with progressive right knee pain that began 4 days agoadmitted for concern of septic arthritis, on further workup suspect spontaneous hemarthrosis complicated by continued knee pain radiating down leg.  Right knee effusion like due tospontaneoushemarthrosis Stable pain in the knee today. Feels ready to go home.  - Will keep him on Fentanyl 75 mcg/hr patch applied today and home percocet which he currently has at home.   Hypotension BP in 90s over 50s. Patient is asymptomatic at rest and with activity. PT notes BP typically higher on the right side. Improve BP when measure on right of about 100/60. Orthostatics with approximate increase in BP with sitting.   ESRD on HD -appreciate nephrology consult for inpatient HD management.  -  monitor renal function - MWF dialysis  -dialysis today, will plan to discharge after dialysis today.   DVT ppx: heparin Diet: Renal  CODE: FULL  Dispo: Anticipated discharge to Home today.  [name] 11:54 AM, 12/19/2019  Pager [phone]  Please contact the on call pager after 5 pm and on weekends at [phone].

## 2019-12-19 NOTE — TOC Transition Note (Incomplete)
Transition of Care [facility]) - CM/SW Discharge Note   Patient Details  Name: [name] MRN: [id number] Date of Birth: 1965-08-24  [facility] of [facility] CM/SW Contact:  Sharin Mons, RN Phone Number: 12/19/2019, 3:14 PM   Clinical Narrative:    Patient will DC to: Home Anticipated DC date: 12/17//2021 Family notified: Sister Transport by: car   Per MD patient ready for DC to . RN, patient, and patient's family facility notified of DC.   Bernerd Terhune (Sister)     [phone]       Final next level of care: [place] Barriers to Discharge: No Barriers Identified   Patient Goals and CMS Choice Patient states their goals for this hospitalization and ongoing recovery are:: to go home and get better   Choice offered to / list presented to : Patient  Discharge Placement                       Discharge Plan and Services   Discharge Planning Services: CM Consult Post Acute Care Choice: [place]                    HH Arranged: RN,PT [place] Agency: [facility] Date [place]: 12/11/19 Time [place]: 8295 Representative spoke with at [place]: Amy  Social Determinants of Health ([place]) Interventions     Readmission Risk Interventions No flowsheet data found.

## 2019-12-19 NOTE — Plan of Care (Signed)

## 2019-12-20 ENCOUNTER — Telehealth: Payer: Self-pay | Admitting: Nephrology

## 2019-12-20 NOTE — Telephone Encounter (Signed)
Transition of Care Contact from [place]   Date of Discharge: 12/19/19 Date of Contact: 12/20/19 Method of contact: phone Talked to patient   Patient contacted to discuss transition of care form recent hospitaliztion. Patient was admitted to [facility] from 12/7 to 12/7 with the discharge diagnosis of R knee effusion likely spontaneous hemarthrosis.     Medication changes were reviewed.   Patient will follow up with is outpatient dialysis center 12/22/19.   Other follow up needs include patient would like to have placement for short term in outpatient rehab center.  He placed call to PCP and is waiting for their response.      Jen Mow, PA-C [facility] Pager: [phone]

## 2019-12-23 ENCOUNTER — Telehealth: Payer: Self-pay | Admitting: *Deleted

## 2019-12-23 NOTE — Discharge Summary (Signed)
Name: [name] MRN: [id number] DOB: 1966/01/01 54 y.o. PCP: [name], MD  Date of Admission: 12/08/2019 12:45 PM Date of Discharge: 12/19/2019  Attending Physician: Dr. [name]  Discharge Diagnosis: Principal Problem:   Hemarthrosis, right knee Active Problems:   ESRD on dialysis [facility])    Discharge Medications: Allergies as of 12/19/2019      Reactions   Hydrocodone Other (See Comments)   Caused involuntary movement and twitching. CANNOT TAKE DUE TO MUSCLE SPASMS AND MUSCLE TREMORS   Colace [docusate] Diarrhea, Nausea And Vomiting   Furadantin [nitrofurantoin] Other (See Comments)   UNSPECIFIED REACTION    Mandelamine [methenamine] Other (See Comments)   UNSPECIFIED REACTION    Noroxin [norfloxacin] Other (See Comments)   UNSPECIFIED REACTION    Sulfa Antibiotics Other (See Comments), Cough   Childhood reaction - pt could not confirm that it was a cough   Sulfur Cough   Childhood reaction - pt could not confirm that it was a cough      Medication List    STOP taking these medications   cefTAZidime 2 g in sodium chloride 0.9 % 100 mL     TAKE these medications   acetaminophen 500 MG tablet Commonly known as: TYLENOL Take 1,000 mg by mouth every 6 (six) hours as needed for mild pain or headache. Notes to patient: Last dose given 2:51pm   aspirin 325 MG tablet Take 325 mg by mouth daily.   calcium acetate 667 MG capsule Commonly known as: PHOSLO Take 3-4 capsules (2,001-2,668 mg total) by mouth See admin instructions. Take 2708 mg by mouth 3 times daily with meals, take 2001 mg by mouth with snacks What changed:   how much to take  when to take this  additional instructions   clopidogrel 75 MG tablet Commonly known as: PLAVIX Take 75 mg by mouth daily.   diclofenac Sodium 1 % Gel Commonly known as: VOLTAREN Apply 2 g topically 2 (two) times daily for 7 days.   escitalopram 5 MG tablet Commonly known as: LEXAPRO Take 1 tablet (5 mg  total) by mouth daily.   famotidine 20 MG tablet Commonly known as: PEPCID Take 1 tablet (20 mg total) by mouth daily.   famotidine 40 MG tablet Commonly known as: PEPCID Take 40 mg by mouth daily.   fluticasone 50 MCG/ACT nasal spray Commonly known as: FLONASE Place 2 sprays into both nostrils as needed for allergies or rhinitis (congestion). What changed: when to take this Notes to patient: Last dose given 12/17 1pm   furosemide 80 MG tablet Commonly known as: LASIX Take 80 mg by mouth 2 (two) times daily as needed for fluid. Notes to patient: Last dose given 12/17 2:52pm   levothyroxine 137 MCG tablet Commonly known as: SYNTHROID Take 137 mcg by mouth daily before breakfast. What changed: Another medication with the same name was removed. Continue taking this medication, and follow the directions you see here.   LORazepam 1 MG tablet Commonly known as: ATIVAN Take 1 mg by mouth daily.   Nepro Liqd Take 237 mLs by mouth daily. Mixed Berry   oxyCODONE-acetaminophen 10-325 MG tablet Commonly known as: PERCOCET Take 1 tablet by mouth every 6 (six) hours as needed for pain. What changed: when to take this Notes to patient: Oxycodone given at 2:50pm 12/17   promethazine 25 MG tablet Commonly known as: PHENERGAN Take 1 tablet (25 mg total) by mouth 2 (two) times daily as needed for nausea or vomiting. Notes to patient: Last  dose given 12/17 9:19am   rosuvastatin 10 MG tablet Commonly known as: CRESTOR Take 1 tablet (10 mg total) by mouth daily.   sodium polystyrene powder Commonly known as: KAYEXALATE Take 15 g by mouth See admin instructions. Take 15 g (mixed in water) by mouth on Saturdays and Sundays Notes to patient: Resume home regimen   traZODone 100 MG tablet Commonly known as: DESYREL Take 1 tablet (100 mg total) by mouth at bedtime.   zolpidem 10 MG tablet Commonly known as: AMBIEN Take 1 tablet (10 mg total) by mouth at bedtime.       Disposition  and follow-up:   [name] was discharged from [facility] in Stable condition.  At the hospital follow up visit please address:  1.  Follow-up: A.Hemearthrosis: - needs assistance with pain management.    B.ESRD: - make sure he follows up with nephro  2.  Labs / imaging needed at time of follow-up: Basic Metabolic Profile and CBC  3.  Pending labs/ test needing follow-up: none  Follow-up Appointments:  Follow-up Information    Altamese Summerland, MD. Schedule an appointment as soon as possible for a visit in 2 week(s).   Specialty: Orthopedic Surgery Contact information: [place] [address] [phone]        [name], MD. Daphane Shepherd on 12/22/2019.   Specialty: Family Medicine Why: televisit Contact information: [address] [place] [address] [phone]        Burnell Blanks, MD .   Specialty: Cardiology Contact information: [place] [address] [place] [address] [phone]        [facility] Follow up.   Specialty: Home Health Services Why: Resumption of home health services arranged, [facility] and PT Contact information: [place] [address] [phone]               Hospital Course by problem list: 54 year old gentleman with past medical history of ESRD, hypertension, severe aortic stenosis, spina bifida, multiple vascular access issues with current access by tunneled dialysis catheter to right thigh and recent history of septic arthritis of his left knee. Presenting with 4-day history of swelling and painful range of motion with the right knee that worsened after hemodialysis. Found to be febrile, with right knee pain and effusion in the ED and started on broad spectrum antibiotics and orthopedic evaluation due recent history of septic arthritis of the left knee. Synovial fluid bloody in appearance with 8000 white blood cell count, no crystals,  and negative gram stain. Cultures negative throughout admission. Effusion likely due to hemarthrosis rather than septic arthritis. Discontinued on antibiotics on day 2, coagulation studies negative. Patient developed swelling at the back of the right knee US showing no DVT or cystic structures. Anticipate swelling will gradually improve with conservative care and home PT.     Discharge Vitals:   BP 129/71 (BP Location: Right Arm)   Pulse 63   Temp 98.8 F (37.1 C) (Oral)   Resp 17   Ht 5\' 11"  (1.803 m)   Wt 118.2 kg   SpO2 100%   BMI 36.34 kg/m   Pertinent Labs, Studies, and Procedures:  CBC Latest Ref Rng & Units 12/19/2019 12/17/2019 12/13/2019  WBC 4.0 - 10.5 K/uL 5.3 5.6 5.9  Hemoglobin 13.0 - 17.0 g/dL 8.6(L) 8.7(L) 8.9(L)  Hematocrit 39.0 - 52.0 % 29.1(L) 29.3(L) 29.4(L)  Platelets 150 - 400 K/uL 175 185 148(L)  CMP Latest Ref Rng & Units 12/19/2019 12/17/2019 12/14/2019  Glucose 70 - 99 mg/dL 99 87 118(H)  BUN 6 - 20 mg/dL 98(H) 88(H) 44(H)  Creatinine 0.61 - 1.24 mg/dL 11.05(H) 11.23(H) 7.12(H)  Sodium 135 - 145 mmol/L 132(L) 132(L) 135  Potassium 3.5 - 5.1 mmol/L 6.7(HH) 7.0(HH) 4.6  Chloride 98 - 111 mmol/L 93(L) 95(L) 98  CO2 22 - 32 mmol/L 25 23 24   Calcium 8.9 - 10.3 mg/dL 8.9 9.3 9.3  Total Protein 6.5 - 8.1 g/dL - - -  Total Bilirubin 0.3 - 1.2 mg/dL - - -  Alkaline Phos 38 - 126 U/L - - -  AST 15 - 41 U/L - - -  ALT 0 - 44 U/L - - -    DG Chest 2 View  Result Date: 12/09/2019 CLINICAL DATA:  Fever, chest pain EXAM: CHEST - 2 VIEW COMPARISON:  10/14/2019 FINDINGS: Low lung volumes. Mild atelectasis/scarring at the left lung base. No pleural effusion or pneumothorax. Cardiomediastinal contours are within normal limits. No acute osseous abnormality. IMPRESSION: Mild atelectasis/scarring at the left lung base. Electronically Signed   By: Macy Mis M.D.   On: 12/09/2019 08:06   DG Knee Complete 4 Views Right  Result Date: 12/09/2019 CLINICAL DATA:  Right  knee pain.  History of remote patellar injury. EXAM: RIGHT KNEE - COMPLETE 4+ VIEW COMPARISON:  None. FINDINGS: Remote transverse patellar fracture with prominent inferior spurring. There is sclerosis along the readily visualized fracture plane. Moderate knee joint effusion. Generalized osteopenia. No acute fracture. IMPRESSION: 1. Remote patellar fracture with malunion. 2. Moderate joint effusion. Electronically Signed   By: Monte Fantasia M.D.   On: 12/09/2019 09:26     Discharge Instructions: Discharge Instructions    Diet - low sodium heart healthy   Complete by: As directed    Increase activity slowly   Complete by: As directed    Please restart home medications listed on discharge summary. Please keep Fentanyl patch on until your follow up appointment with you pain management doctor. Please call us if you have any questions or concerns at [phone].   No wound care   Complete by: As directed       Signed:  Lawerance Cruel, D.O.  Internal Medicine Resident, PGY-2 [name]  Pager: [phone] 7:17 AM, 12/23/2019

## 2019-12-23 NOTE — Telephone Encounter (Addendum)
Information was sent through CoverMyMeds for PA for Diclofenac Gel 1%.  Awaiting determination within 1-3 days.  Sander Nephew, RN 12/23/2019 12:15 PM. PA for Diclofenac 1% Gel was approved for coverage 09/24/2019 thru 12/22/2020.  Sander Nephew, RN 12/24/2019 9:23 AM.

## 2020-01-04 NOTE — Op Note (Signed)
NAMEDERRIAN, POLI MEDICAL RECORD [id number] ACCOUNT [id number] DATE OF BIRTH:02/07/1965 FACILITY: [facility] LOCATION: [facility] PHYSICIAN:Codee H. Kyran Whittier, MD  OPERATIVE REPORT  DATE OF PROCEDURE:  12/09/2019  PREOPERATIVE DIAGNOSIS:  Septic arthritis, right knee.  POSTOPERATIVE DIAGNOSIS:  Hemarthrosis with possible septic arthritis, right knee.  PROCEDURE:  Arthrotomy with incision and drainage of right knee.  SURGEON:  Altamese Ryder, MD  ASSISTANT:  PA student.  ANESTHESIA:  General.  COMPLICATIONS:  None.  TOURNIQUET:  None.  DISPOSITION:  To PACU.  CONDITION:  Stable.  SPECIMENS:  Anaerobic and aerobic sent to micro.  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  The patient underwent an incision and drainage of the  contralateral knee by Dr. Marlou Sa several months ago.  He underwent an aspiration tap which demonstrated elevation of white cells, but not conclusively infected according to the cell count.  However, this coincided with the clinical picture, which he  described as identical to the contralateral side and was rapidly progressive.  We discussed possibly waiting and the risks and benefits of drainage.  He acknowledged these risks and strongly wished to proceed emergently as again we were concerned about  the potential for infection and possible either contamination of or source from his peripheral vascular bypass grafts.  BRIEF SUMMARY OF PROCEDURE:  The patient was taken to the operating room where general anesthesia was induced.  The right lower extremity was prepped and draped in the usual sterile fashion with chlorhexidine wash, Betadine scrub and paint and draped.  A  timeout was held.  Two longitudinal incisions were then made, one superolateral and the other inferomedial to allow for introduction of the cannulas  through these arthrotomies and then a thorough lavage while taking the knee through a range of motion.   We did not identify any purulence whatsoever, but rather a large collection of blood or hemarthrosis.  Six liters were washed through the knee joint while taking it through gentle range of motion.  A deep drain was left in place and then a closure  performed with a 2-0 nylon.  There were no complications during the procedure.  A sterile gently compressive dressing was applied just at the knee given his peripheral vascular disease.  The patient was awakened from anesthesia and transferred to PACU in  stable condition.  PROGNOSIS:  We will await the cultures obtained, both at the time of the tap as well as in the OR today, which were sent to anaerobic, aerobic microanalysis.  At this time, it certainly seems very possible this could be a hemarthrosis alone without  infection, which would be a great scenario for him given his potential for severe complications.  He can mobilize as tolerated.  HN/NUANCE  D:01/04/2020 T:01/04/2020 JOB:[id number]

## 2020-01-05 ENCOUNTER — Telehealth: Payer: Self-pay

## 2020-01-05 ENCOUNTER — Encounter (HOSPITAL_COMMUNITY): Payer: Self-pay | Admitting: Emergency Medicine

## 2020-01-05 ENCOUNTER — Inpatient Hospital Stay (HOSPITAL_COMMUNITY)
Admission: EM | Admit: 2020-01-05 | Discharge: 2020-01-16 | DRG: 314 | Disposition: A | Discharge status: Home Health | Payer: Medicare Other | Attending: Student in an Organized Health Care Education/Training Program | Admitting: Student in an Organized Health Care Education/Training Program

## 2020-01-05 DIAGNOSIS — M898X9 Other specified disorders of bone, unspecified site: Secondary | ICD-10-CM | POA: Diagnosis present

## 2020-01-05 DIAGNOSIS — G4700 Insomnia, unspecified: Secondary | ICD-10-CM | POA: Diagnosis present

## 2020-01-05 DIAGNOSIS — K5900 Constipation, unspecified: Secondary | ICD-10-CM | POA: Diagnosis present

## 2020-01-05 DIAGNOSIS — Z8249 Family history of ischemic heart disease and other diseases of the circulatory system: Secondary | ICD-10-CM

## 2020-01-05 DIAGNOSIS — G894 Chronic pain syndrome: Secondary | ICD-10-CM | POA: Diagnosis present

## 2020-01-05 DIAGNOSIS — Z833 Family history of diabetes mellitus: Secondary | ICD-10-CM

## 2020-01-05 DIAGNOSIS — D631 Anemia in chronic kidney disease: Secondary | ICD-10-CM | POA: Diagnosis present

## 2020-01-05 DIAGNOSIS — M109 Gout, unspecified: Secondary | ICD-10-CM | POA: Diagnosis not present

## 2020-01-05 DIAGNOSIS — Z79899 Other long term (current) drug therapy: Secondary | ICD-10-CM

## 2020-01-05 DIAGNOSIS — R509 Fever, unspecified: Secondary | ICD-10-CM | POA: Diagnosis not present

## 2020-01-05 DIAGNOSIS — N186 End stage renal disease: Secondary | ICD-10-CM | POA: Diagnosis not present

## 2020-01-05 DIAGNOSIS — Z936 Other artificial openings of urinary tract status: Secondary | ICD-10-CM

## 2020-01-05 DIAGNOSIS — T82818A Embolism of vascular prosthetic devices, implants and grafts, initial encounter: Principal | ICD-10-CM | POA: Diagnosis present

## 2020-01-05 DIAGNOSIS — Y832 Surgical operation with anastomosis, bypass or graft as the cause of abnormal reaction of the patient, or of later complication, without mention of misadventure at the time of the procedure: Secondary | ICD-10-CM | POA: Diagnosis present

## 2020-01-05 DIAGNOSIS — F419 Anxiety disorder, unspecified: Secondary | ICD-10-CM | POA: Diagnosis present

## 2020-01-05 DIAGNOSIS — Z888 Allergy status to other drugs, medicaments and biological substances status: Secondary | ICD-10-CM

## 2020-01-05 DIAGNOSIS — E785 Hyperlipidemia, unspecified: Secondary | ICD-10-CM | POA: Diagnosis present

## 2020-01-05 DIAGNOSIS — M79671 Pain in right foot: Secondary | ICD-10-CM

## 2020-01-05 DIAGNOSIS — Z818 Family history of other mental and behavioral disorders: Secondary | ICD-10-CM

## 2020-01-05 DIAGNOSIS — Z20822 Contact with and (suspected) exposure to covid-19: Secondary | ICD-10-CM | POA: Diagnosis present

## 2020-01-05 DIAGNOSIS — Z882 Allergy status to sulfonamides status: Secondary | ICD-10-CM

## 2020-01-05 DIAGNOSIS — Z79890 Hormone replacement therapy (postmenopausal): Secondary | ICD-10-CM

## 2020-01-05 DIAGNOSIS — Z992 Dependence on renal dialysis: Secondary | ICD-10-CM

## 2020-01-05 DIAGNOSIS — T8249XA Other complication of vascular dialysis catheter, initial encounter: Secondary | ICD-10-CM | POA: Diagnosis present

## 2020-01-05 DIAGNOSIS — E875 Hyperkalemia: Secondary | ICD-10-CM | POA: Diagnosis not present

## 2020-01-05 DIAGNOSIS — N2581 Secondary hyperparathyroidism of renal origin: Secondary | ICD-10-CM | POA: Diagnosis present

## 2020-01-05 DIAGNOSIS — I739 Peripheral vascular disease, unspecified: Secondary | ICD-10-CM | POA: Diagnosis present

## 2020-01-05 DIAGNOSIS — I953 Hypotension of hemodialysis: Secondary | ICD-10-CM | POA: Diagnosis not present

## 2020-01-05 DIAGNOSIS — Z79891 Long term (current) use of opiate analgesic: Secondary | ICD-10-CM

## 2020-01-05 DIAGNOSIS — Z7982 Long term (current) use of aspirin: Secondary | ICD-10-CM

## 2020-01-05 DIAGNOSIS — I120 Hypertensive chronic kidney disease with stage 5 chronic kidney disease or end stage renal disease: Secondary | ICD-10-CM | POA: Diagnosis present

## 2020-01-05 DIAGNOSIS — T829XXA Unspecified complication of cardiac and vascular prosthetic device, implant and graft, initial encounter: Secondary | ICD-10-CM

## 2020-01-05 DIAGNOSIS — Z7902 Long term (current) use of antithrombotics/antiplatelets: Secondary | ICD-10-CM

## 2020-01-05 DIAGNOSIS — Q059 Spina bifida, unspecified: Secondary | ICD-10-CM

## 2020-01-05 DIAGNOSIS — M25061 Hemarthrosis, right knee: Secondary | ICD-10-CM | POA: Diagnosis present

## 2020-01-05 DIAGNOSIS — I350 Nonrheumatic aortic (valve) stenosis: Secondary | ICD-10-CM | POA: Diagnosis present

## 2020-01-05 DIAGNOSIS — I2510 Atherosclerotic heart disease of native coronary artery without angina pectoris: Secondary | ICD-10-CM | POA: Diagnosis present

## 2020-01-05 DIAGNOSIS — K219 Gastro-esophageal reflux disease without esophagitis: Secondary | ICD-10-CM | POA: Diagnosis present

## 2020-01-05 DIAGNOSIS — E039 Hypothyroidism, unspecified: Secondary | ICD-10-CM | POA: Diagnosis present

## 2020-01-05 DIAGNOSIS — Z885 Allergy status to narcotic agent status: Secondary | ICD-10-CM

## 2020-01-05 LAB — COMPREHENSIVE METABOLIC PANEL
ALT: 9 U/L (ref 0–44)
AST: 11 U/L — ABNORMAL LOW (ref 15–41)
Albumin: 3.3 g/dL — ABNORMAL LOW (ref 3.5–5.0)
Alkaline Phosphatase: 102 U/L (ref 38–126)
Anion gap: 15 (ref 5–15)
BUN: 52 mg/dL — ABNORMAL HIGH (ref 6–20)
CO2: 29 mmol/L (ref 22–32)
Calcium: 8.8 mg/dL — ABNORMAL LOW (ref 8.9–10.3)
Chloride: 91 mmol/L — ABNORMAL LOW (ref 98–111)
Creatinine, Ser: 8.16 mg/dL — ABNORMAL HIGH (ref 0.61–1.24)
GFR, Estimated: 7 mL/min — ABNORMAL LOW (ref >60)
Glucose, Bld: 102 mg/dL — ABNORMAL HIGH (ref 70–99)
Potassium: 4.4 mmol/L (ref 3.5–5.1)
Sodium: 135 mmol/L (ref 135–145)
Total Bilirubin: 0.9 mg/dL (ref 0.3–1.2)
Total Protein: 8.3 g/dL — ABNORMAL HIGH (ref 6.5–8.1)

## 2020-01-05 LAB — CBC WITH DIFFERENTIAL/PLATELET
Abs Immature Granulocytes: 0.02 10*3/uL (ref 0.00–0.07)
Basophils Absolute: 0 10*3/uL (ref 0.0–0.1)
Basophils Relative: 0 %
Eosinophils Absolute: 0.1 10*3/uL (ref 0.0–0.5)
Eosinophils Relative: 1 %
HCT: 31.9 % — ABNORMAL LOW (ref 39.0–52.0)
Hemoglobin: 9.2 g/dL — ABNORMAL LOW (ref 13.0–17.0)
Immature Granulocytes: 0 %
Lymphocytes Relative: 10 %
Lymphs Abs: 0.6 10*3/uL — ABNORMAL LOW (ref 0.7–4.0)
MCH: 27.7 pg (ref 26.0–34.0)
MCHC: 28.8 g/dL — ABNORMAL LOW (ref 30.0–36.0)
MCV: 96.1 fL (ref 80.0–100.0)
Monocytes Absolute: 0.5 10*3/uL (ref 0.1–1.0)
Monocytes Relative: 9 %
Neutro Abs: 4.3 10*3/uL (ref 1.7–7.7)
Neutrophils Relative %: 80 %
Platelets: 143 10*3/uL — ABNORMAL LOW (ref 150–400)
RBC: 3.32 MIL/uL — ABNORMAL LOW (ref 4.22–5.81)
RDW: 16.2 % — ABNORMAL HIGH (ref 11.5–15.5)
WBC: 5.5 10*3/uL (ref 4.0–10.5)
nRBC: 0 % (ref 0.0–0.2)

## 2020-01-05 NOTE — Telephone Encounter (Signed)
Margaret with [facility] called to inform us that patient was in office today and currently has RLE Fem Cath. She states after about 2 hours they were only able to get his flow to 150 (Normal is 450). She was advised by NP to call here to let us know and that patient will be getting sent to hospital. I advised that once patient is there  , they could page one of our On call providers to consult. She voiced her understanding.

## 2020-01-05 NOTE — ED Triage Notes (Signed)
Pt arrives via [facility] from [facility] where he reports getting about an hour of his dialysis session due to difficulty with his dialysis cathter (in  R leg) working. States hx of the same in October as well. Pt had tpa x2 and sensipar through catheter without result. EMS VSS 111/57, HR 67, 94% on ra.

## 2020-01-06 ENCOUNTER — Inpatient Hospital Stay (HOSPITAL_COMMUNITY): Payer: Medicare Other

## 2020-01-06 ENCOUNTER — Encounter (HOSPITAL_COMMUNITY): Payer: Self-pay | Admitting: Student in an Organized Health Care Education/Training Program

## 2020-01-06 ENCOUNTER — Other Ambulatory Visit: Payer: Self-pay

## 2020-01-06 DIAGNOSIS — M79609 Pain in unspecified limb: Secondary | ICD-10-CM | POA: Diagnosis not present

## 2020-01-06 DIAGNOSIS — E039 Hypothyroidism, unspecified: Secondary | ICD-10-CM

## 2020-01-06 DIAGNOSIS — M25411 Effusion, right shoulder: Secondary | ICD-10-CM | POA: Diagnosis not present

## 2020-01-06 DIAGNOSIS — M7989 Other specified soft tissue disorders: Secondary | ICD-10-CM | POA: Diagnosis not present

## 2020-01-06 DIAGNOSIS — M109 Gout, unspecified: Secondary | ICD-10-CM | POA: Diagnosis not present

## 2020-01-06 DIAGNOSIS — T8249XA Other complication of vascular dialysis catheter, initial encounter: Secondary | ICD-10-CM | POA: Diagnosis not present

## 2020-01-06 DIAGNOSIS — L02416 Cutaneous abscess of left lower limb: Secondary | ICD-10-CM | POA: Diagnosis not present

## 2020-01-06 DIAGNOSIS — E785 Hyperlipidemia, unspecified: Secondary | ICD-10-CM | POA: Diagnosis present

## 2020-01-06 DIAGNOSIS — G894 Chronic pain syndrome: Secondary | ICD-10-CM | POA: Diagnosis present

## 2020-01-06 DIAGNOSIS — Z992 Dependence on renal dialysis: Secondary | ICD-10-CM

## 2020-01-06 DIAGNOSIS — Z936 Other artificial openings of urinary tract status: Secondary | ICD-10-CM | POA: Diagnosis not present

## 2020-01-06 DIAGNOSIS — D631 Anemia in chronic kidney disease: Secondary | ICD-10-CM | POA: Diagnosis present

## 2020-01-06 DIAGNOSIS — T82868A Thrombosis of vascular prosthetic devices, implants and grafts, initial encounter: Secondary | ICD-10-CM

## 2020-01-06 DIAGNOSIS — Q059 Spina bifida, unspecified: Secondary | ICD-10-CM | POA: Diagnosis not present

## 2020-01-06 DIAGNOSIS — G4700 Insomnia, unspecified: Secondary | ICD-10-CM | POA: Diagnosis present

## 2020-01-06 DIAGNOSIS — N186 End stage renal disease: Secondary | ICD-10-CM | POA: Diagnosis present

## 2020-01-06 DIAGNOSIS — N2581 Secondary hyperparathyroidism of renal origin: Secondary | ICD-10-CM | POA: Diagnosis present

## 2020-01-06 DIAGNOSIS — I2510 Atherosclerotic heart disease of native coronary artery without angina pectoris: Secondary | ICD-10-CM | POA: Diagnosis present

## 2020-01-06 DIAGNOSIS — E875 Hyperkalemia: Secondary | ICD-10-CM | POA: Diagnosis not present

## 2020-01-06 DIAGNOSIS — I953 Hypotension of hemodialysis: Secondary | ICD-10-CM | POA: Diagnosis not present

## 2020-01-06 DIAGNOSIS — Z20822 Contact with and (suspected) exposure to covid-19: Secondary | ICD-10-CM | POA: Diagnosis present

## 2020-01-06 DIAGNOSIS — F419 Anxiety disorder, unspecified: Secondary | ICD-10-CM | POA: Diagnosis present

## 2020-01-06 DIAGNOSIS — Y832 Surgical operation with anastomosis, bypass or graft as the cause of abnormal reaction of the patient, or of later complication, without mention of misadventure at the time of the procedure: Secondary | ICD-10-CM | POA: Diagnosis present

## 2020-01-06 DIAGNOSIS — T82818A Embolism of vascular prosthetic devices, implants and grafts, initial encounter: Secondary | ICD-10-CM | POA: Diagnosis present

## 2020-01-06 DIAGNOSIS — I350 Nonrheumatic aortic (valve) stenosis: Secondary | ICD-10-CM | POA: Diagnosis present

## 2020-01-06 DIAGNOSIS — M25561 Pain in right knee: Secondary | ICD-10-CM | POA: Diagnosis not present

## 2020-01-06 DIAGNOSIS — M898X9 Other specified disorders of bone, unspecified site: Secondary | ICD-10-CM | POA: Diagnosis present

## 2020-01-06 DIAGNOSIS — I739 Peripheral vascular disease, unspecified: Secondary | ICD-10-CM | POA: Diagnosis present

## 2020-01-06 DIAGNOSIS — K5900 Constipation, unspecified: Secondary | ICD-10-CM | POA: Diagnosis present

## 2020-01-06 DIAGNOSIS — R509 Fever, unspecified: Secondary | ICD-10-CM | POA: Diagnosis not present

## 2020-01-06 DIAGNOSIS — K219 Gastro-esophageal reflux disease without esophagitis: Secondary | ICD-10-CM | POA: Diagnosis present

## 2020-01-06 DIAGNOSIS — M25061 Hemarthrosis, right knee: Secondary | ICD-10-CM | POA: Diagnosis present

## 2020-01-06 DIAGNOSIS — I120 Hypertensive chronic kidney disease with stage 5 chronic kidney disease or end stage renal disease: Secondary | ICD-10-CM | POA: Diagnosis present

## 2020-01-06 HISTORY — DX: Other complication of vascular dialysis catheter, initial encounter: T82.49XA

## 2020-01-06 LAB — RENAL FUNCTION PANEL
Albumin: 3.1 g/dL — ABNORMAL LOW (ref 3.5–5.0)
Anion gap: 15 (ref 5–15)
BUN: 59 mg/dL — ABNORMAL HIGH (ref 6–20)
CO2: 27 mmol/L (ref 22–32)
Calcium: 8.7 mg/dL — ABNORMAL LOW (ref 8.9–10.3)
Chloride: 93 mmol/L — ABNORMAL LOW (ref 98–111)
Creatinine, Ser: 9.56 mg/dL — ABNORMAL HIGH (ref 0.61–1.24)
GFR, Estimated: 6 mL/min — ABNORMAL LOW (ref >60)
Glucose, Bld: 77 mg/dL (ref 70–99)
Phosphorus: 5.2 mg/dL — ABNORMAL HIGH (ref 2.5–4.6)
Potassium: 4.6 mmol/L (ref 3.5–5.1)
Sodium: 135 mmol/L (ref 135–145)

## 2020-01-06 LAB — CBC
HCT: 30.9 % — ABNORMAL LOW (ref 39.0–52.0)
Hemoglobin: 8.9 g/dL — ABNORMAL LOW (ref 13.0–17.0)
MCH: 27.8 pg (ref 26.0–34.0)
MCHC: 28.8 g/dL — ABNORMAL LOW (ref 30.0–36.0)
MCV: 96.6 fL (ref 80.0–100.0)
Platelets: 127 10*3/uL — ABNORMAL LOW (ref 150–400)
RBC: 3.2 MIL/uL — ABNORMAL LOW (ref 4.22–5.81)
RDW: 16.3 % — ABNORMAL HIGH (ref 11.5–15.5)
WBC: 4.2 10*3/uL (ref 4.0–10.5)
nRBC: 0 % (ref 0.0–0.2)

## 2020-01-06 LAB — RESP PANEL BY RT-PCR (FLU A&B, COVID) ARPGX2
Influenza A by PCR: NEGATIVE
Influenza B by PCR: NEGATIVE
SARS Coronavirus 2 by RT PCR: NEGATIVE

## 2020-01-06 MED ORDER — LEVOTHYROXINE SODIUM 25 MCG PO TABS
137.0000 ug | ORAL_TABLET | Freq: Every day | ORAL | Status: DC
  Administered 2020-01-07 – 2020-01-16 (×9): 137 ug via ORAL
  Filled 2020-01-06 (×7): qty 1

## 2020-01-06 MED ORDER — PROMETHAZINE HCL 25 MG/ML IJ SOLN
INTRAMUSCULAR | Status: AC
  Filled 2020-01-06: qty 1

## 2020-01-06 MED ORDER — POLYETHYLENE GLYCOL 3350 17 G PO PACK: 17.0000 g | PACK | Freq: Every day | ORAL | Status: DC | PRN

## 2020-01-06 MED ORDER — FAMOTIDINE 10 MG PO TABS
10.0000 mg | ORAL_TABLET | Freq: Every day | ORAL | Status: DC
  Filled 2020-01-06: qty 1

## 2020-01-06 MED ORDER — ACETAMINOPHEN 325 MG PO TABS
650.0000 mg | ORAL_TABLET | Freq: Four times a day (QID) | ORAL | Status: DC | PRN
  Administered 2020-01-06 – 2020-01-15 (×5): 650 mg via ORAL
  Filled 2020-01-06 (×6): qty 2

## 2020-01-06 MED ORDER — SENNA 8.6 MG PO TABS: 1.0000 | ORAL_TABLET | Freq: Every evening | ORAL | Status: DC | PRN

## 2020-01-06 MED ORDER — ROSUVASTATIN CALCIUM 5 MG PO TABS
10.0000 mg | ORAL_TABLET | Freq: Every day | ORAL | Status: DC
  Administered 2020-01-06 – 2020-01-16 (×11): 10 mg via ORAL
  Filled 2020-01-06 (×11): qty 2

## 2020-01-06 MED ORDER — ESCITALOPRAM OXALATE 10 MG PO TABS
5.0000 mg | ORAL_TABLET | Freq: Every day | ORAL | Status: DC
Start: 2020-01-06 — End: 2020-01-17
  Administered 2020-01-06 – 2020-01-16 (×11): 5 mg via ORAL
  Filled 2020-01-06 (×11): qty 1

## 2020-01-06 MED ORDER — TRAZODONE HCL 100 MG PO TABS
100.0000 mg | ORAL_TABLET | Freq: Every day | ORAL | Status: DC
  Administered 2020-01-06 – 2020-01-15 (×10): 100 mg via ORAL
  Filled 2020-01-06 (×10): qty 1

## 2020-01-06 MED ORDER — LORAZEPAM 1 MG PO TABS
1.0000 mg | ORAL_TABLET | Freq: Every day | ORAL | Status: DC
  Administered 2020-01-06 – 2020-01-11 (×6): 1 mg via ORAL
  Filled 2020-01-06 (×6): qty 1

## 2020-01-06 MED ORDER — PROMETHAZINE HCL 25 MG/ML IJ SOLN
25.0000 mg | Freq: Once | INTRAMUSCULAR | Status: AC
  Administered 2020-01-06: 25 mg via INTRAVENOUS

## 2020-01-06 MED ORDER — CLOPIDOGREL BISULFATE 75 MG PO TABS
75.0000 mg | ORAL_TABLET | Freq: Every day | ORAL | Status: DC
  Administered 2020-01-07 – 2020-01-16 (×10): 75 mg via ORAL
  Filled 2020-01-06 (×11): qty 1

## 2020-01-06 MED ORDER — ASPIRIN 325 MG PO TABS
325.0000 mg | ORAL_TABLET | Freq: Every day | ORAL | Status: DC
  Administered 2020-01-06 – 2020-01-16 (×11): 325 mg via ORAL
  Filled 2020-01-06 (×11): qty 1

## 2020-01-06 MED ORDER — HEPARIN SODIUM (PORCINE) 5000 UNIT/ML IJ SOLN
5000.0000 [IU] | Freq: Three times a day (TID) | INTRAMUSCULAR | Status: DC
  Administered 2020-01-06 – 2020-01-16 (×27): 5000 [IU] via SUBCUTANEOUS
  Filled 2020-01-06 (×25): qty 1

## 2020-01-06 MED ORDER — FAMOTIDINE 20 MG PO TABS: 20.0000 mg | ORAL_TABLET | Freq: Every day | ORAL | Status: DC

## 2020-01-06 MED ORDER — ALTEPLASE 2 MG IJ SOLR: 6.0000 mg | Freq: Once | INTRAMUSCULAR | Status: AC | PRN

## 2020-01-06 MED ORDER — ALTEPLASE 2 MG IJ SOLR
INTRAMUSCULAR | Status: AC
  Administered 2020-01-06: 6 mg
  Filled 2020-01-06: qty 6

## 2020-01-06 MED ORDER — ONDANSETRON HCL 4 MG/2ML IJ SOLN
4.0000 mg | Freq: Four times a day (QID) | INTRAMUSCULAR | Status: DC | PRN
  Administered 2020-01-06 – 2020-01-08 (×4): 4 mg via INTRAVENOUS
  Filled 2020-01-06 (×5): qty 2

## 2020-01-06 MED ORDER — CHLORHEXIDINE GLUCONATE CLOTH 2 % EX PADS
6.0000 | MEDICATED_PAD | Freq: Every day | CUTANEOUS | Status: DC
  Administered 2020-01-06 – 2020-01-16 (×10): 6 via TOPICAL

## 2020-01-06 MED ORDER — HYDROMORPHONE HCL 1 MG/ML IJ SOLN
0.5000 mg | Freq: Four times a day (QID) | INTRAMUSCULAR | Status: DC | PRN
Start: 2020-01-06 — End: 2020-01-07
  Administered 2020-01-06 – 2020-01-07 (×4): 0.5 mg via INTRAVENOUS
  Filled 2020-01-06 (×4): qty 1

## 2020-01-06 MED ORDER — ALTEPLASE 2 MG IJ SOLR
6.0000 mg | Freq: Once | INTRAMUSCULAR | Status: AC | PRN
  Administered 2020-01-07: 6 mg
  Filled 2020-01-06: qty 6

## 2020-01-06 MED ORDER — CALCIUM ACETATE (PHOS BINDER) 667 MG PO CAPS
2668.0000 mg | ORAL_CAPSULE | Freq: Three times a day (TID) | ORAL | Status: DC
  Administered 2020-01-06 – 2020-01-16 (×20): 2668 mg via ORAL
  Filled 2020-01-06 (×24): qty 4

## 2020-01-06 NOTE — Consult Note (Signed)
Hospital Consult    Reason for Consult: Malfunctioning tunneled right femoral dialysis catheter Referring Physician: ED MRN #:  [id number]  History of Present Illness: This is a 55 y.o. male with multiple medical problems including end-stage renal disease that vascular surgery has been consulted for malfunctioning tunneled right femoral dialysis catheter.  He is well-known to the vascular surgery service and as previously noted he is on his last option from our standpoint with this tunneled catheter in his right groin through a collateral that ultimately traverses into the IVC.  Apparently when he had his dialysis session yesterday he feels it was rushed and they did not use the catheter in the appropriate position nor did they use the Cathflo for the appropriate length of time and he fills he was constipated.  Past Medical History:  Diagnosis Date  . Anemia   . Anxiety   . Constipation   . End stage renal failure on dialysis [facility])    [place]; MWF, on HD since 1997, exhausted upper ext access, and possibly LE access; cath dependent in R groin as of 06/2018  . GERD (gastroesophageal reflux disease)   . Grand mal seizure ([place]) X 4   last 1998 (02/29/2016)  . History of blood transfusion 2000s   "I was having blood loss; never found out from where"  . History of kidney stones   . Hypertension    hx of - has not taken bp meds in over 2 years  since dialysis  . Hypothyroidism   . Insomnia   . Migraine    "due to my BP in my late 1990s; none since" (02/29/2016)  . Nonhealing surgical wound    of the left arteriovenous graft  . Severe aortic stenosis    no problems with per pt. - nephrologist and Dr. Coletta Memos  . Spina bifida [facility])     Past Surgical History:  Procedure Laterality Date  . AMPUTATION Left 12/11/2017   Procedure: Left Great Toe Amputation;  Surgeon: [name], MD;  Location: [place];  Service: Orthopedics;  Laterality: Left;  . APPENDECTOMY    . APPLICATION OF WOUND  VAC Left 10/04/2019   Procedure: APPLICATION OF WOUND VAC;  Surgeon: Serafina Mitchell, MD;  Location: [place];  Service: Vascular;  Laterality: Left;  . ARTERIOVENOUS GRAFT PLACEMENT Left 10/16/2012   left femoral goretex graft         Dr Donnetta Hutching  . AV FISTULA PLACEMENT Left 06/27/2012   Procedure: EXPLORATORY LEFT THY-GRAFT PSEUDO-ANEURYSM;  Surgeon: Conrad Glenarden, MD;  Location: [place];  Service: Vascular;  Laterality: Left;  Revision of left Arteriovenus gortex graft in thigh.  Bertram Savin REMOVAL Left 10/04/2019   Procedure: REMOVAL OF ARTERIOVENOUS GORETEX GRAFT ([place]);  Surgeon: Serafina Mitchell, MD;  Location: [facility] OR;  Service: Vascular;  Laterality: Left;  . COLONOSCOPY    . DRESSING CHANGE UNDER ANESTHESIA Left 10/07/2019   Procedure: DRESSING CHANGE UNDER ANESTHESIA;  Surgeon: Meredith Pel, MD;  Location: [place];  Service: Orthopedics;  Laterality: Left;  . FLEXIBLE SIGMOIDOSCOPY N/A 12/03/2018   Procedure: FLEXIBLE SIGMOIDOSCOPY;  Surgeon: Yetta Flock, MD;  Location: [place];  Service: Gastroenterology;  Laterality: N/A;  . I & D EXTREMITY Left 02/29/2016   Procedure: DEBRIDEMENT LEFT THIGH WOUND;  Surgeon: Waynetta Sandy, MD;  Location: [place];  Service: Vascular;  Laterality: Left;  . I & D EXTREMITY Left 10/04/2019   Procedure: IRRIGATION AND DEBRIDEMENT LEFT THIGH;  Surgeon: Harold Barban  W, MD;  Location: [place];  Service: Vascular;  Laterality: Left;  . I & D EXTREMITY Right 12/09/2019   Procedure: IRRIGATION AND DEBRIDEMENT EXTREMITY;  Surgeon: Alt[place]e [place], MD;  Location: [place];  Service: Orthopedics;  Laterality: Right;  . ILEOSTOMY  1970s  . INCISION AND DRAINAGE Left 10/16/2012   Procedure: INCISION AND Debridement left thigh graft;  Surgeon: Rosetta Posner, MD;  Location: [place];  Service: Vascular;  Laterality: Left;  . INSERTION OF DIALYSIS CATHETER    . INSERTION OF DIALYSIS CATHETER  01/14/2016   Procedure: INSERTION OF DIALYSIS CATHETER;  Surgeon: Waynetta Sandy, MD;  Location: [place];  Service: Vascular;;  . INSERTION OF DIALYSIS CATHETER Right 08/07/2017   Procedure: INSERTION OF DIALYSIS CATHETER;  Surgeon: Waynetta Sandy, MD;  Location: [place];  Service: Vascular;  Laterality: Right;  . INSERTION OF DIALYSIS CATHETER Right 12/21/2017   Procedure: RIGHT FEMORAL DIALYSIS CATHETER EXCHANGE;  Surgeon: Marty Heck, MD;  Location: [place];  Service: Vascular;  Laterality: Right;  . INSERTION OF ILIAC STENT Left 01/14/2016   Procedure: INSERTION OF ILIAC STENT;  Surgeon: Waynetta Sandy, MD;  Location: [place];  Service: Vascular;  Laterality: Left;  . INTRAOPERATIVE ARTERIOGRAM Left 01/14/2016   Procedure: INTRA OPERATIVE ARTERIOGRAM;  Surgeon: Waynetta Sandy, MD;  Location: [place];  Service: Vascular;  Laterality: Left;  . IR NEPHROSTOMY EXCHANGE LEFT  03/21/2019  . IR NEPHROSTOMY PLACEMENT LEFT  11/28/2018  . IRRIGATION AND DEBRIDEMENT EXTREMITY (Right   12/09/2019  . KNEE ARTHROSCOPY Left 10/07/2019   Procedure: ARTHROSCOPY KNEE w/ STIMULATING  BEAD PLACMENT;  Surgeon: Meredith Pel, MD;  Location: [place];  Service: Orthopedics;  Laterality: Left;  . LEFT HEART CATH AND CORONARY ANGIOGRAPHY N/A 10/17/2019   Procedure: LEFT HEART CATH AND CORONARY ANGIOGRAPHY;  Surgeon: Jettie Booze, MD;  Location: [place] CV LAB;  Service: Cardiovascular;  Laterality: N/A;  . LITHOTRIPSY  x 3   "& a laser treatment" (02/29/2016)  . NEPHROLITHOTOMY Left 03/26/2019   Procedure: NEPHROLITHOTOMY PERCUTANEOUS;  Surgeon: Alexis Frock, MD;  Location: [facility];  Service: Urology;  Laterality: Left;  2 HRS  . NEPHROSTOMY Bilateral 1968  . PATELLAR TENDON REPAIR Right 1990s   "big incision"  . PERIPHERAL VASCULAR CATHETERIZATION  01/14/2016   Procedure: A/V SHUNTOGRAM;  Surgeon: Waynetta Sandy, MD;  Location: [place];  Service: Vascular;;  . REVISION OF ARTERIOVENOUS GORETEX GRAFT Left 10/16/2012   Procedure:  REVISION OF LEFT FEMORAL LOOP ARTERIOVENOUS GORETEX GRAFT;  Surgeon: Rosetta Posner, MD;  Location: [place];  Service: Vascular;  Laterality: Left;  . REVISION OF ARTERIOVENOUS GORETEX GRAFT Left 02/11/2015   Procedure: EXCISION OF SMALL SEGMENT OF EXPOSED LEFT THIGH NON FUNCTIONING  ARTERIOVENOUS GORETEX GRAFT;  Surgeon: Mal Misty, MD;  Location: [place];  Service: Vascular;  Laterality: Left;  . REVISION OF ARTERIOVENOUS GORETEX GRAFT Left 01/14/2016   Procedure: REVISION OF ARTERIOVENOUS GORETEX GRAFT;  Surgeon: Waynetta Sandy, MD;  Location: [place];  Service: Vascular;  Laterality: Left;  . REVISION OF ARTERIOVENOUS GORETEX GRAFT Left 02/29/2016   thigh/pt report  . REVISION OF ARTERIOVENOUS GORETEX GRAFT Left 02/29/2016   Procedure: POSSIBLE REVISION OF LEFT THIGH ARTERIOVENOUS GORETEX GRAFT;  Surgeon: Waynetta Sandy, MD;  Location: [place];  Service: Vascular;  Laterality: Left;  . TEE WITHOUT CARDIOVERSION N/A 12/14/2017   Procedure: TRANSESOPHAGEAL ECHOCARDIOGRAM (TEE);  Surgeon: Thayer Headings, MD;  Location: [facility] ENDOSCOPY;  Service: Cardiovascular;  Laterality: N/A;  . THROMBECTOMY AND REVISION OF ARTERIOVENTOUS (AV) GORETEX  GRAFT Left 06/12/2018   Procedure: Removal of infected left thigh graft;  Surgeon: Rosetta Posner, MD;  Location: [place];  Service: Vascular;  Laterality: Left;  . THROMBECTOMY W/ EMBOLECTOMY Left 01/14/2016   Procedure: THROMBECTOMY ARTERIOVENOUS GORE-TEX Left thigh GRAFT;  Surgeon: Waynetta Sandy, MD;  Location: [place];  Service: Vascular;  Laterality: Left;  . TONGUE SURGERY  ~ 1990   tongue-tie release   . ULTRASOUND GUIDANCE FOR VASCULAR ACCESS Right 08/07/2017   Procedure: ULTRASOUND GUIDANCE FOR VASCULAR CANNULATION RIGHT FEMORAL VEIN AND LEFT AV FEMORAL GRAFT;  Surgeon: Waynetta Sandy, MD;  Location: [place];  Service: Vascular;  Laterality: Right;  . UPPER EXTREMITY VENOGRAPHY Bilateral 08/09/2017   Procedure: UPPER EXTREMITY  VENOGRAPHY;  Surgeon: Serafina Mitchell, MD;  Location: [place] [facility];  Service: Cardiovascular;  Laterality: Bilateral;  . VENOGRAM N/A 09/20/2017   Procedure: RIGHT COMMON FEMORAL ARTERY EXPLORATION.  CANNULATION RIGHT COMMON FEMORAL VEIN. VENOGRAM CENTRAL ULTRA SOUND GUIDED RIGHT FEMORAL VEIN TIMES TWO.;  Surgeon: Waynetta Sandy, MD;  Location: [place];  Service: Vascular;  Laterality: N/A;  . VENOGRAM Right 10/04/2019   Procedure: VENOGRAM;  Surgeon: Serafina Mitchell, MD;  Location: [place];  Service: Vascular;  Laterality: Right;  . WOUND DEBRIDEMENT Left 02/29/2016   thigh    Allergies  Allergen Reactions  . Hydrocodone Other (See Comments)    Caused involuntary movement and twitching. CANNOT TAKE DUE TO MUSCLE SPASMS AND MUSCLE TREMORS  . Colace [Docusate] Diarrhea and Nausea And Vomiting  . Furadantin [Nitrofurantoin] Other (See Comments)    UNSPECIFIED REACTION   . Mandelamine [Methenamine] Other (See Comments)    UNSPECIFIED REACTION   . Noroxin [Norfloxacin] Other (See Comments)    UNSPECIFIED REACTION   . Sulfa Antibiotics Other (See Comments) and Cough    Childhood reaction - pt could not confirm that it was a cough  . Sulfur Cough    Childhood reaction - pt could not confirm that it was a cough    Prior to Admission medications   Medication Sig Start Date End Date Taking? Authorizing Provider  acetaminophen (TYLENOL) 500 MG tablet Take 1,000 mg by mouth every 6 (six) hours as needed for mild pain or headache.    [provider]  aspirin 325 MG tablet Take 325 mg by mouth daily.    [provider]  calcium acetate (PHOSLO) 667 MG capsule Take 3-4 capsules (2,001-2,668 mg total) by mouth See admin instructions. Take 2708 mg by mouth 3 times daily with meals, take 2001 mg by mouth with snacks Patient taking differently: Take 2,668 mg by mouth 3 (three) times daily with meals.  01/14/18   Hennie Duos, MD  clopidogrel (PLAVIX) 75 MG tablet Take  75 mg by mouth daily.    [provider]  escitalopram (LEXAPRO) 5 MG tablet Take 1 tablet (5 mg total) by mouth daily. 10/19/19 12/09/19  Lyndee Hensen, DO  famotidine (PEPCID) 20 MG tablet Take 1 tablet (20 mg total) by mouth daily. Patient not taking: Reported on 12/09/2019 03/25/19   Gifford Shave, MD  famotidine (PEPCID) 40 MG tablet Take 40 mg by mouth daily. 11/03/19   [provider]  fluticasone (FLONASE) 50 MCG/ACT nasal spray Place 2 sprays into both nostrils as needed for allergies or rhinitis (congestion). Patient taking differently: Place 2 sprays into both nostrils daily as needed for allergies or  rhinitis (congestion).  01/14/18   Hennie Duos, MD  furosemide (LASIX) 80 MG tablet Take 80 mg by mouth 2 (two) times daily as needed for fluid.     [provider]  levothyroxine (SYNTHROID) 137 MCG tablet Take 137 mcg by mouth daily before breakfast.    [provider]  LORazepam (ATIVAN) 1 MG tablet Take 1 mg by mouth daily.     [provider]  Nutritional Supplements (NEPRO) LIQD Take 237 mLs by mouth daily. Mixed Berry    [provider]  oxyCODONE-acetaminophen (PERCOCET) 10-325 MG tablet Take 1 tablet by mouth every 6 (six) hours as needed for pain. Patient taking differently: Take 1 tablet by mouth every 6 (six) hours.  06/14/18   Dagoberto Ligas, PA-C  promethazine (PHENERGAN) 25 MG tablet Take 1 tablet (25 mg total) by mouth 2 (two) times daily as needed for nausea or vomiting. 01/14/18   Hennie Duos, MD  rosuvastatin (CRESTOR) 10 MG tablet Take 1 tablet (10 mg total) by mouth daily. 10/18/19 12/09/19  Lyndee Hensen, DO  sodium polystyrene (KAYEXALATE) powder Take 15 g by mouth See admin instructions. Take 15 g (mixed in water) by mouth on Saturdays and Sundays 01/14/18   Hennie Duos, MD  traZODone (DESYREL) 100 MG tablet Take 1 tablet (100 mg total) by mouth at bedtime. 01/14/18   Hennie Duos, MD   zolpidem (AMBIEN) 10 MG tablet Take 1 tablet (10 mg total) by mouth at bedtime. 01/14/18   Hennie Duos, MD    Social History   Socioeconomic History  . Marital status: Single    Spouse name: Not on file  . Number of children: 0  . Years of education: Not on file  . Highest education level: Not on file  Occupational History  . Occupation: Disabled  Tobacco Use  . Smoking status: Never Smoker  . Smokeless tobacco: Never Used  Vaping Use  . Vaping Use: Never used  Substance and Sexual Activity  . Alcohol use: Not Currently    Alcohol/week: 0.0 standard drinks    Comment: 02/29/2016 "nothing since  the mid 1990s  . Drug use: Not Currently    Types: Marijuana    Comment: 02/29/2016 "nothing since ~ 1995"  . Sexual activity: Yes  Other Topics Concern  . Not on file  Social History Narrative   Lives with sister    Social Determinants of Health   Financial Resource Strain: Not on file  Food Insecurity: Not on file  Transportation Needs: Not on file  Physical Activity: Not on file  Stress: Not on file  Social Connections: Not on file  Intimate Partner Violence: Not on file     Family History  Problem Relation Age of Onset  . Diabetes Mother   . Hypertension Mother   . Heart disease Mother        before age 67  . Diabetes Father   . Heart attack Father        X's 3  . Diabetes Sister   . Bipolar disorder Sister     ROS: [x]  Positive   [ ]  Negative   [ ]  All sytems reviewed and are negative  Cardiovascular: []  chest pain/pressure []  palpitations []  SOB lying flat []  DOE []  pain in legs while walking []  pain in legs at rest []  pain in legs at night []  non-healing ulcers []  hx of DVT []  swelling in legs  Pulmonary: []  productive cough []  asthma/wheezing []  home  O2  Neurologic: []  weakness in []  arms []  legs []  numbness in []  arms []  legs []  hx of CVA []  mini stroke [] difficulty speaking or slurred speech []  temporary loss of vision in one  eye []  dizziness  Hematologic: []  hx of cancer []  bleeding problems []  problems with blood clotting easily  Endocrine:   []  diabetes []  thyroid disease  GI []  vomiting blood []  blood in stool  GU: []  CKD/renal failure []  HD--[]  M/W/F or []  T/T/S []  burning with urination []  blood in urine  Psychiatric: []  anxiety []  depression  Musculoskeletal: []  arthritis []  joint pain  Integumentary: []  rashes []  ulcers  Constitutional: []  fever []  chills   Physical Examination  Vitals:   01/06/20 0545 01/06/20 0600  BP: 113/60 108/67  Pulse: 64 63  Resp: 16 12  Temp:    SpO2: 91% 98%   There is no height or weight on file to calculate BMI.  General:  WDWN in NAD Gait: Not observed HENT: WNL, normocephalic Pulmonary: normal non-labored breathing, without Rales, rhonchi,  wheezing Cardiac: regular, without  Murmurs, rubs or gallops Vascular Exam/Pulses: Right femoral tunneled dialysis catheter Extremities: without ischemic changes Musculoskeletal: no muscle wasting or atrophy  Neurologic: A&O X 3; Appropriate Affect ; SENSATION: normal; MOTOR FUNCTION:  moving all extremities equally. Speech is fluent/normal   CBC    Component Value Date/Time   WBC 4.2 01/06/2020 0334   RBC 3.20 (L) 01/06/2020 0334   HGB 8.9 (L) 01/06/2020 0334   HCT 30.9 (L) 01/06/2020 0334   PLT 127 (L) 01/06/2020 0334   MCV 96.6 01/06/2020 0334   MCH 27.8 01/06/2020 0334   MCHC 28.8 (L) 01/06/2020 0334   RDW 16.3 (H) 01/06/2020 0334   LYMPHSABS 0.6 (L) 01/05/2020 1259   MONOABS 0.5 01/05/2020 1259   EOSABS 0.1 01/05/2020 1259   BASOSABS 0.0 01/05/2020 1259    BMET    Component Value Date/Time   NA 135 01/06/2020 0334   NA 135 (A) 01/17/2018 0000   K 4.6 01/06/2020 0334   CL 93 (L) 01/06/2020 0334   CO2 27 01/06/2020 0334   GLUCOSE 77 01/06/2020 0334   BUN 59 (H) 01/06/2020 0334   BUN 67 (A) 01/17/2018 0000   CREATININE 9.56 (H) 01/06/2020 0334   CALCIUM 8.7 (L) 01/06/2020  0334   GFRNONAA 6 (L) 01/06/2020 0334   GFRAA 10 (L) 10/07/2019 0430    COAGS: Lab Results  Component Value Date   INR 1.1 12/11/2019   INR 1.0 12/01/2018   INR 1.2 11/26/2018     Non-Invasive Vascular Imaging:    None   ASSESSMENT/PLAN: This is a 55 y.o. male with multiple medical problems including ESRD and malfunctioning right femoral tunneled dialysis catheter.  As previously noted this is his last option from vascular surgery standpoint given many failed accesses in the past.    I did offer him at attempt at catheter exchange in the OR.  Ultimately, he would like to make another attempt to use the catheter and feels that they did not position the catheter appropriately yesterday nor did they use Cathflo long enough nor did run it at a lower rate and he was constipated which he feels all limits the catheter function.  If all this fails we can make an attempt to do something in the operating room under general anesthesia tomorrow.  He understands if this fails ultimately would need a translumbar catheter or some other option from IR.  Marty Heck, MD [facility] Office: [place]

## 2020-01-06 NOTE — Consult Note (Addendum)
[facility] Renal Consultation Note    Indication for Consultation:  Management of ESRD/hemodialysis, anemia, hypertension/volume, and secondary hyperparathyroidism. PCP:  HPI: [name] is a 55 y.o. male with ESRD, spina bifida, Hx ileostomy, HTN, Hx kidney stones, and hypothyroidism who was admitted with dysfunctional R dialysis catheter.  Presented to his outpatient HD center on Monday 01/05/20 and TDC unable to be used. The lumens were instilled with cathflo and still unable to get adequate blood flow rate. He was directed to the ED for evaluation. There, vascular surgery was consulted with discussion of possible TDC exchange. The patient wanted to try again with dialysis today to see if the catheter would work before committing to the procedure. He has long-standing vascular access issues. The current catheter is threaded through a collateral vein in R groin. The next option would be translumbar catheter if unable to salvage this access.  Denies CP, dyspnea, N/V or fever. C/o chronic pains.  Today - we brought him to the HD unit. Initially, could not get adequate poush/pull through the catheter. Treated cath lumens with cathflo x 1 hr again, then able to get HD started - currently running with BFR 250 (not great).  Dialyzes on MWF schedule at [facility] - last full HD on Friday 12/31. He was  not dialyzed prior to admit on 01/05/20.  Past Medical History:  Diagnosis Date  . Anemia   . Anxiety   . Constipation   . End stage renal failure on dialysis [facility])    [place]; MWF, on HD since 1997, exhausted upper ext access, and possibly LE access; cath dependent in R groin as of 06/2018  . GERD (gastroesophageal reflux disease)   . Grand mal seizure ([place]) X 4   last 1998 (02/29/2016)  . History of blood transfusion 2000s   "I was having blood loss; never found out from where"  . History of kidney stones   . Hypertension    hx of - has not taken bp meds in over 2  years  since dialysis  . Hypothyroidism   . Insomnia   . Migraine    "due to my BP in my late 1990s; none since" (02/29/2016)  . Nonhealing surgical wound    of the left arteriovenous graft  . Severe aortic stenosis    no problems with per pt. - nephrologist and Dr. Coletta Memos  . Spina bifida [facility])    Past Surgical History:  Procedure Laterality Date  . AMPUTATION Left 12/11/2017   Procedure: Left Great Toe Amputation;  Surgeon: [name], MD;  Location: [place];  Service: Orthopedics;  Laterality: Left;  . APPENDECTOMY    . APPLICATION OF WOUND VAC Left 10/04/2019   Procedure: APPLICATION OF WOUND VAC;  Surgeon: Serafina Mitchell, MD;  Location: [place];  Service: Vascular;  Laterality: Left;  . ARTERIOVENOUS GRAFT PLACEMENT Left 10/16/2012   left femoral goretex graft         Dr Donnetta Hutching  . AV FISTULA PLACEMENT Left 06/27/2012   Procedure: EXPLORATORY LEFT THY-GRAFT PSEUDO-ANEURYSM;  Surgeon: Conrad La Plata, MD;  Location: [place];  Service: Vascular;  Laterality: Left;  Revision of left Arteriovenus gortex graft in thigh.  Bertram Savin REMOVAL Left 10/04/2019   Procedure: REMOVAL OF ARTERIOVENOUS GORETEX GRAFT ([place]);  Surgeon: Serafina Mitchell, MD;  Location: [facility];  Service: Vascular;  Laterality: Left;  . COLONOSCOPY    . DRESSING CHANGE UNDER ANESTHESIA Left 10/07/2019   Procedure: DRESSING CHANGE UNDER  ANESTHESIA;  Surgeon: Meredith Pel, MD;  Location: [place];  Service: Orthopedics;  Laterality: Left;  . FLEXIBLE SIGMOIDOSCOPY N/A 12/03/2018   Procedure: FLEXIBLE SIGMOIDOSCOPY;  Surgeon: Yetta Flock, MD;  Location: [place];  Service: Gastroenterology;  Laterality: N/A;  . I & D EXTREMITY Left 02/29/2016   Procedure: DEBRIDEMENT LEFT THIGH WOUND;  Surgeon: Waynetta Sandy, MD;  Location: [place];  Service: Vascular;  Laterality: Left;  . I & D EXTREMITY Left 10/04/2019   Procedure: IRRIGATION AND DEBRIDEMENT LEFT THIGH;  Surgeon: Serafina Mitchell, MD;  Location: [place];   Service: Vascular;  Laterality: Left;  . I & D EXTREMITY Right 12/09/2019   Procedure: IRRIGATION AND DEBRIDEMENT EXTREMITY;  Surgeon: Altamese [place]ton Hills, MD;  Location: [place];  Service: Orthopedics;  Laterality: Right;  . ILEOSTOMY  1970s  . INCISION AND DRAINAGE Left 10/16/2012   Procedure: INCISION AND Debridement left thigh graft;  Surgeon: Rosetta Posner, MD;  Location: [place];  Service: Vascular;  Laterality: Left;  . INSERTION OF DIALYSIS CATHETER    . INSERTION OF DIALYSIS CATHETER  01/14/2016   Procedure: INSERTION OF DIALYSIS CATHETER;  Surgeon: Waynetta Sandy, MD;  Location: [place];  Service: Vascular;;  . INSERTION OF DIALYSIS CATHETER Right 08/07/2017   Procedure: INSERTION OF DIALYSIS CATHETER;  Surgeon: Waynetta Sandy, MD;  Location: [place];  Service: Vascular;  Laterality: Right;  . INSERTION OF DIALYSIS CATHETER Right 12/21/2017   Procedure: RIGHT FEMORAL DIALYSIS CATHETER EXCHANGE;  Surgeon: Marty Heck, MD;  Location: [place];  Service: Vascular;  Laterality: Right;  . INSERTION OF ILIAC STENT Left 01/14/2016   Procedure: INSERTION OF ILIAC STENT;  Surgeon: Waynetta Sandy, MD;  Location: [place];  Service: Vascular;  Laterality: Left;  . INTRAOPERATIVE ARTERIOGRAM Left 01/14/2016   Procedure: INTRA OPERATIVE ARTERIOGRAM;  Surgeon: Waynetta Sandy, MD;  Location: [place];  Service: Vascular;  Laterality: Left;  . IR NEPHROSTOMY EXCHANGE LEFT  03/21/2019  . IR NEPHROSTOMY PLACEMENT LEFT  11/28/2018  . IRRIGATION AND DEBRIDEMENT EXTREMITY (Right   12/09/2019  . KNEE ARTHROSCOPY Left 10/07/2019   Procedure: ARTHROSCOPY KNEE w/ STIMULATING  BEAD PLACMENT;  Surgeon: Meredith Pel, MD;  Location: [place];  Service: Orthopedics;  Laterality: Left;  . LEFT HEART CATH AND CORONARY ANGIOGRAPHY N/A 10/17/2019   Procedure: LEFT HEART CATH AND CORONARY ANGIOGRAPHY;  Surgeon: Jettie Booze, MD;  Location: [place] [facility];  Service: Cardiovascular;   Laterality: N/A;  . LITHOTRIPSY  x 3   "& a laser treatment" (02/29/2016)  . NEPHROLITHOTOMY Left 03/26/2019   Procedure: NEPHROLITHOTOMY PERCUTANEOUS;  Surgeon: Alexis Frock, MD;  Location: [facility];  Service: Urology;  Laterality: Left;  2 HRS  . NEPHROSTOMY Bilateral 1968  . PATELLAR TENDON REPAIR Right 1990s   "big incision"  . PERIPHERAL VASCULAR CATHETERIZATION  01/14/2016   Procedure: A/V SHUNTOGRAM;  Surgeon: Waynetta Sandy, MD;  Location: [place];  Service: Vascular;;  . REVISION OF ARTERIOVENOUS GORETEX GRAFT Left 10/16/2012   Procedure: REVISION OF LEFT FEMORAL LOOP ARTERIOVENOUS GORETEX GRAFT;  Surgeon: Rosetta Posner, MD;  Location: [place];  Service: Vascular;  Laterality: Left;  . REVISION OF ARTERIOVENOUS GORETEX GRAFT Left 02/11/2015   Procedure: EXCISION OF SMALL SEGMENT OF EXPOSED LEFT THIGH NON FUNCTIONING  ARTERIOVENOUS GORETEX GRAFT;  Surgeon: Mal Misty, MD;  Location: [place];  Service: Vascular;  Laterality: Left;  . REVISION OF ARTERIOVENOUS GORETEX GRAFT Left 01/14/2016  Procedure: REVISION OF ARTERIOVENOUS GORETEX GRAFT;  Surgeon: Waynetta Sandy, MD;  Location: [place];  Service: Vascular;  Laterality: Left;  . REVISION OF ARTERIOVENOUS GORETEX GRAFT Left 02/29/2016   thigh/pt report  . REVISION OF ARTERIOVENOUS GORETEX GRAFT Left 02/29/2016   Procedure: POSSIBLE REVISION OF LEFT THIGH ARTERIOVENOUS GORETEX GRAFT;  Surgeon: Waynetta Sandy, MD;  Location: [place];  Service: Vascular;  Laterality: Left;  . TEE WITHOUT CARDIOVERSION N/A 12/14/2017   Procedure: TRANSESOPHAGEAL ECHOCARDIOGRAM (TEE);  Surgeon: Acie Fredrickson, Wonda Cheng, MD;  Location: [place];  Service: Cardiovascular;  Laterality: N/A;  . THROMBECTOMY AND REVISION OF ARTERIOVENTOUS (AV) GORETEX  GRAFT Left 06/12/2018   Procedure: Removal of infected left thigh graft;  Surgeon: Rosetta Posner, MD;  Location: [place];  Service: Vascular;  Laterality: Left;  . THROMBECTOMY W/ EMBOLECTOMY Left  01/14/2016   Procedure: THROMBECTOMY ARTERIOVENOUS GORE-TEX Left thigh GRAFT;  Surgeon: Waynetta Sandy, MD;  Location: [place];  Service: Vascular;  Laterality: Left;  . TONGUE SURGERY  ~ 1990   tongue-tie release   . ULTRASOUND GUIDANCE FOR VASCULAR ACCESS Right 08/07/2017   Procedure: ULTRASOUND GUIDANCE FOR VASCULAR CANNULATION RIGHT FEMORAL VEIN AND LEFT AV FEMORAL GRAFT;  Surgeon: Waynetta Sandy, MD;  Location: [place];  Service: Vascular;  Laterality: Right;  . UPPER EXTREMITY VENOGRAPHY Bilateral 08/09/2017   Procedure: UPPER EXTREMITY VENOGRAPHY;  Surgeon: Serafina [place], MD;  Location: [place] [facility];  Service: Cardiovascular;  Laterality: Bilateral;  . VENOGRAM N/A 09/20/2017   Procedure: RIGHT COMMON FEMORAL ARTERY EXPLORATION.  CANNULATION RIGHT COMMON FEMORAL VEIN. VENOGRAM CENTRAL ULTRA SOUND GUIDED RIGHT FEMORAL VEIN TIMES TWO.;  Surgeon: Waynetta Sandy, MD;  Location: [place];  Service: Vascular;  Laterality: N/A;  . VENOGRAM Right 10/04/2019   Procedure: VENOGRAM;  Surgeon: Serafina Mitchell, MD;  Location: [place];  Service: Vascular;  Laterality: Right;  . WOUND DEBRIDEMENT Left 02/29/2016   thigh   Family History  Problem Relation Age of Onset  . Diabetes Mother   . Hypertension Mother   . Heart disease Mother        before age 37  . Diabetes Father   . Heart attack Father        X's 3  . Diabetes Sister   . Bipolar disorder Sister    Social History:  reports that he has never smoked. He has never used smokeless tobacco. He reports previous alcohol use. He reports previous drug use. Drug: Marijuana.  ROS: As per HPI otherwise negative.  Physical Exam: Vitals:   01/06/20 0919 01/06/20 0949 01/06/20 1011 01/06/20 1049  BP: (!) 116/59 (!) 124/54 114/82 119/62  Pulse:   86   Resp: 12 (!) 27 (!) 28 16  Temp:      TempSrc:      SpO2:   95%      General: Well developed, well nourished, in no acute distress. Head: Normocephalic, atraumatic,  sclera non-icteric, mucus membranes are moist. Neck: Supple without lymphadenopathy/masses. JVD not elevated. Lungs: Clear bilaterally to auscultation without wheezes, rales, or rhonchi.  Heart: RRR with normal S1, S2. No murmurs, rubs, or gallops appreciated. Abdomen: Soft, non-tender, non-distended. Ostomy bag in place. Musculoskeletal:  Strength and tone appear normal for age. Lower extremities: No edema or ischemic changes, no open wounds. Neuro: Alert and oriented X 3. Moves all extremities spontaneously. Psych:  Responds to questions appropriately with a normal affect. Dialysis Access: R femoral TDC  Allergies  Allergen Reactions  .  Hydrocodone Other (See Comments)    Caused involuntary movement and twitching. CANNOT TAKE DUE TO MUSCLE SPASMS AND MUSCLE TREMORS  . Colace [Docusate] Diarrhea and Nausea And Vomiting  . Furadantin [Nitrofurantoin] Other (See Comments)    UNSPECIFIED REACTION   . Mandelamine [Methenamine] Other (See Comments)    UNSPECIFIED REACTION   . Noroxin [Norfloxacin] Other (See Comments)    UNSPECIFIED REACTION   . Sulfa Antibiotics Other (See Comments) and Cough    Childhood reaction - pt could not confirm that it was a cough  . Sulfur Cough    Childhood reaction - pt could not confirm that it was a cough   Prior to Admission medications   Medication Sig Start Date End Date Taking? Authorizing Provider  acetaminophen (TYLENOL) 500 MG tablet Take 1,000 mg by mouth every 6 (six) hours as needed for mild pain or headache.    [provider]  aspirin 325 MG tablet Take 325 mg by mouth daily.    [provider]  calcium acetate (PHOSLO) 667 MG capsule Take 3-4 capsules (2,001-2,668 mg total) by mouth See admin instructions. Take 2708 mg by mouth 3 times daily with meals, take 2001 mg by mouth with snacks Patient taking differently: Take 2,668 mg by mouth 3 (three) times daily with meals.  01/14/18   Hennie Duos, MD  clopidogrel (PLAVIX)  75 MG tablet Take 75 mg by mouth daily.    [provider]  escitalopram (LEXAPRO) 5 MG tablet Take 1 tablet (5 mg total) by mouth daily. 10/19/19 12/09/19  Lyndee Hensen, DO  famotidine (PEPCID) 20 MG tablet Take 1 tablet (20 mg total) by mouth daily. Patient not taking: Reported on 12/09/2019 03/25/19   Gifford Shave, MD  famotidine (PEPCID) 40 MG tablet Take 40 mg by mouth daily. 11/03/19   [provider]  fluticasone (FLONASE) 50 MCG/ACT nasal spray Place 2 sprays into both nostrils as needed for allergies or rhinitis (congestion). Patient taking differently: Place 2 sprays into both nostrils daily as needed for allergies or rhinitis (congestion).  01/14/18   Hennie Duos, MD  furosemide (LASIX) 80 MG tablet Take 80 mg by mouth 2 (two) times daily as needed for fluid.     [provider]  levothyroxine (SYNTHROID) 137 MCG tablet Take 137 mcg by mouth daily before breakfast.    [provider]  LORazepam (ATIVAN) 1 MG tablet Take 1 mg by mouth daily.     [provider]  Nutritional Supplements (NEPRO) LIQD Take 237 mLs by mouth daily. Mixed Berry    [provider]  oxyCODONE-acetaminophen (PERCOCET) 10-325 MG tablet Take 1 tablet by mouth every 6 (six) hours as needed for pain. Patient taking differently: Take 1 tablet by mouth every 6 (six) hours.  06/14/18   Dagoberto Ligas, PA-C  promethazine (PHENERGAN) 25 MG tablet Take 1 tablet (25 mg total) by mouth 2 (two) times daily as needed for nausea or vomiting. 01/14/18   Hennie Duos, MD  rosuvastatin (CRESTOR) 10 MG tablet Take 1 tablet (10 mg total) by mouth daily. 10/18/19 12/09/19  Lyndee Hensen, DO  sodium polystyrene (KAYEXALATE) powder Take 15 g by mouth See admin instructions. Take 15 g (mixed in water) by mouth on Saturdays and Sundays 01/14/18   Hennie Duos, MD  traZODone (DESYREL) 100 MG tablet Take 1 tablet (100 mg total) by mouth at bedtime. 01/14/18   Hennie Duos, MD  zolpidem (AMBIEN) 10 MG tablet Take 1 tablet (  10 mg total) by mouth at bedtime. 01/14/18   Hennie Duos, MD   Current Facility-Administered Medications  Medication Dose Route Frequency Provider Last Rate Last Admin  . aspirin tablet 325 mg  325 mg Oral Daily Agyei, Obed K, MD      . calcium acetate (PHOSLO) capsule 2,668 mg  2,668 mg Oral TID WC Agyei, Obed K, MD      . clopidogrel (PLAVIX) tablet 75 mg  75 mg Oral Daily Agyei, Obed K, MD      . escitalopram (LEXAPRO) tablet 5 mg  5 mg Oral Daily Agyei, Obed K, MD      . famotidine (PEPCID) tablet 20 mg  20 mg Oral Daily Agyei, Obed K, MD      . heparin injection 5,000 Units  5,000 Units Subcutaneous Q8H Agyei, Obed K, MD      . HYDROmorphone (DILAUDID) injection 0.5 mg  0.5 mg Intravenous Q6H PRN Jean [place], MD   0.5 mg at 01/06/20 0507  . levothyroxine (SYNTHROID) tablet 137 mcg  137 mcg Oral QAC breakfast Agyei, Obed K, MD      . LORazepam (ATIVAN) tablet 1 mg  1 mg Oral Daily Agyei, Obed K, MD      . ondansetron (ZOFRAN) injection 4 mg  4 mg Intravenous Q6H PRN Jean Rosenthal, MD   4 mg at 01/06/20 0507  . promethazine (PHENERGAN) 25 MG/ML injection           . rosuvastatin (CRESTOR) tablet 10 mg  10 mg Oral Daily Agyei, Obed K, MD      . senna (SENOKOT) tablet 8.6 mg  1 tablet Oral QHS PRN Agyei, Obed K, MD      . traZODone (DESYREL) tablet 100 mg  100 mg Oral QHS Jean Rosenthal, MD       Labs: Basic Metabolic Panel: Recent Labs  Lab 01/05/20 1259 01/06/20 0334  NA 135 135  K 4.4 4.6  CL 91* 93*  CO2 29 27  GLUCOSE 102* 77  BUN 52* 59*  CREATININE 8.16* 9.56*  CALCIUM 8.8* 8.7*  PHOS  --  5.2*   Liver Function Tests: Recent Labs  Lab 01/05/20 1259 01/06/20 0334  AST 11*  --   ALT 9  --   ALKPHOS 102  --   BILITOT 0.9  --   PROT 8.3*  --   ALBUMIN 3.3* 3.1*   CBC: Recent Labs  Lab 01/05/20 1259 01/06/20 0334  WBC 5.5 4.2  NEUTROABS 4.3  --   HGB 9.2* 8.9*  HCT 31.9* 30.9*  MCV 96.1 96.6   PLT 143* 127*   Studies/Results: MR KNEE RIGHT WO CONTRAST  Result Date: 01/06/2020 CLINICAL DATA:  End-stage renal disease on hemodialysis. Right knee pain, instability and swelling. History of hemarthrosis. Recent arthroscopic knee surgery. EXAM: MRI OF THE RIGHT KNEE WITHOUT CONTRAST TECHNIQUE: Multiplanar, multisequence MR imaging of the knee was performed. No intravenous contrast was administered. COMPARISON:  MRI 12/17/2019 FINDINGS: MENISCI Medial meniscus:  Intact Lateral meniscus: Moderate degenerative changes and oblique coursing inferior articular surface tear involving the posterior horn mid body junction region. LIGAMENTS Cruciates:  Intact Collaterals:  Intact CARTILAGE Patellofemoral:  Moderate degenerative chondrosis. Medial:  Moderate degenerative chondrosis. Lateral:  Moderate degenerative chondrosis. Joint: There is a moderate to large joint effusion with septations and some internal debris. Mild surrounding inflammatory changes but I do not see any obvious destructive cartilage changes or marrow edema to suggest septic arthritis. Joint aspiration may  be helpful for further evaluation. Popliteal Fossa:  No popliteal mass or Baker's cyst. Extensor Mechanism: Severe chronic patellar tendinopathy and probable prior surgical changes. Patella [place] is noted with an elongated and diseased patellar tendon. The quadriceps tendon is intact. Bones: No acute bony findings. No findings suspicious for osteomyelitis. Other: Unremarkable knee musculature. No findings for myositis or pyomyositis. IMPRESSION: 1. Moderate to large joint effusion with septations and some internal debris. Mild surrounding inflammatory changes but I do not see any obvious destructive cartilage changes or marrow edema to suggest septic arthritis. Joint aspiration may be helpful for further evaluation. 2. Intact ligamentous structures and no acute bony findings. 3. Degenerative changes and oblique coursing inferior articular  surface tear involving the posterior horn mid body junction region of the lateral meniscus. 4. Severe chronic patellar tendinopathy and probable prior surgical changes. Electronically Signed   By: Marijo Sanes M.D.   On: 01/06/2020 08:37    Dialysis Orders:  MWF at [facility] 4:45hr, 200 dialyzer, 350/800, EDW 117kg, 2K/2.25Ca, TDC, heparin 3700 bolus - Sensipar 30mg  PO q HD - Venofer 50mg  IV q week - Mircera 261mcg IV q 2 weeks (last 1/3)  Assessment/Plan: 1.  Dysfunctional TDC: Able to eventually get running today, but BFR 250 - still low. Would probably still recommend [facility] exchange, will defer to [facility]VS and patient to decide. 2.  ESRD: Continue HD per MWF schedule - HD today after missing inability to run yesterday - next due tomorrow. 3.  Hypertension/volume: BP stable, 5L UFG today. 4.  Anemia: Hgb 8.9 - just given ESA, follow. 5.  Metabolic bone disease: Ca/Phos ok - continue home binders. 6.  Spina bifida 7.  Chronic pain syndrome  ADDENDUM (12:58p): HD going ok - now 3 hrs in (of 4 hr HD) and still maintaining BFR 250. Getting fluid off. Discussed options. He wants to hold off on cath replacement for now. Wants to stay the night in the hospital and try HD again tomorrow - which is his usual day. He is hopeful that can at least maintain BFR 250-300 range which will likely be adequate for him to maintain his clearance. This sounds like a reasonable plan to me. Will discuss with his primary team.    [name], PA-C 01/06/2020, 11:41 AM  [facility]

## 2020-01-06 NOTE — H&P (Signed)
Date: 01/06/2020               Patient Name:  [name] MRN: [id number]  DOB: 1965/10/21 Age / Sex: 55 y.o., male   PCP: [name], MD         Medical Service: Internal Medicine Teaching Service         Attending Physician: Dr. Evette Doffing, Mallie Mussel, *    First Contact: Dr. Carroll Sage Pager: [phone]  Second Contact: Dr. Maudie Mercury Pager: [phone]       After Hours (After 5p/  First Contact Pager: [phone]  weekends / holidays): Second Contact Pager: [phone]   Chief Complaint: Clotted Right tunnel dialysis catheter.   History of Present Illness:   [name] is a 55 yo male with PMH of ESRD on HD MWF (access through R femoral tunnel catheter), HTN, severe aortic stenosis, spinal bifida, chronic opioid use, hx of Left knee septic arthritis, who presented to ED for clotted R femoral dialysis catheter. Per notes, patient was 1 hour in dialysis and had difficulty getting flow to 150 (normal 450). Patient states that when flexing his right hip kinked the catheter. Also contributed it to his constipation. Patient endorses nausea, subjective fever, chill, chest pain with deep breaths. He denies flank pain, or shortness of breath.   Patient has history of difficult vascular access. 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  Per notes, his Right tunnel catheter is the last resort.  Patient also complains of Right knee pain. He was recently admitted on 12/09/2019 for Right knee joint pain and effusion. MRI obtained showed unremarkable osseus structure. Arthrocentesis showed WBC of 8000, no crystal, Gram stain did not show any organism and culture was negative. Doppler US was negative for DVT. The effusion was thought to be hemarthrosis rather than septic arthritis. Patient was treated with 2 days of antibiotics.  After discharge, patient reports ongoing pain that oxycodone provides minimal relief.  He reports subjective fever  and chills, states that T-max at home was 101.  His right knee pain limited his ambulation.  In the ED, CBC shows normal white count, hemoglobin of 9 which is at baseline, MCV 96.1 and platelets 143.  CMP shows potassium 4.4, sodium 135 and creatinine of 8.16.   Meds:  No outpatient medications have been marked as taking for the 01/05/20 encounter [facility] Encounter).     Allergies: Allergies as of 01/05/2020 - Review Complete 01/05/2020  Allergen Reaction Noted  . Hydrocodone Other (See Comments) 11/05/2012  . Colace [docusate] Diarrhea and Nausea And Vomiting 10/13/2019  . Furadantin [nitrofurantoin] Other (See Comments) 06/27/2012  . Mandelamine [methenamine] Other (See Comments) 06/27/2012  . Noroxin [norfloxacin] Other (See Comments) 06/27/2012  . Sulfa antibiotics Other (See Comments) and Cough 06/24/2012  . Sulfur Cough 02/08/2015   Past Medical History:  Diagnosis Date  . Anemia   . Anxiety   . Constipation   . End stage renal failure on dialysis [facility])    [place]; MWF, on HD since 1997, exhausted upper ext access, and possibly LE access; cath dependent in R groin as of 06/2018  . GERD (gastroesophageal reflux disease)   . Grand mal seizure ([place]) X 4   last 1998 (02/29/2016)  . History of blood transfusion 2000s   "I was having blood loss; never found out from where"  . History of kidney stones   . Hypertension    hx of - has not taken bp  meds in over 2 years  since dialysis  . Hypothyroidism   . Insomnia   . Migraine    "due to my BP in my late 1990s; none since" (02/29/2016)  . Nonhealing surgical wound    of the left arteriovenous graft  . Severe aortic stenosis    no problems with per pt. - nephrologist and Dr. Coletta Memos  . Spina bifida ([place])     Family History:  Mom and dad have diabetes  Social History:  Denies alcohol, smoking or recreational drug use  Review of Systems: A complete ROS was negative except as per HPI.   Physical Exam: Blood pressure  132/88, pulse 73, temperature 98.9 F (37.2 C), temperature source Oral, resp. rate 16, SpO2 98 %. Physical Exam Constitutional:      General: He is not in acute distress. HENT:     Head: Normocephalic.  Eyes:     General:        Right eye: No discharge.        Left eye: No discharge.  Cardiovascular:     Rate and Rhythm: Normal rate and regular rhythm.     Heart sounds: Murmur (3/6 systolic murmur heard best at left sternal border and apex) heard.    Pulmonary:     Effort: No respiratory distress.     Breath sounds: Normal breath sounds.  Abdominal:     General: Bowel sounds are normal.     Tenderness: There is no abdominal tenderness. There is no right CVA tenderness or left CVA tenderness.     Comments: Colostomy back of right lower abdomen.  Nontender to palpation around the bag.  Musculoskeletal:     Cervical back: Normal range of motion.     Comments: Right knee joint swollen compared to the left side.  None erythema.  Warm to touch.  Significant tenderness with flexion and extension.  Skin:    General: Skin is warm.     Comments: Right femoral dialysis catheter in place.  No pain to palpation around the site.  Neurological:     Mental Status: He is alert.  Psychiatric:        Mood and Affect: Mood normal.    55 yo male with PMH of ESRD on HD MWF (access through R femoral tunnel catheter), HTN, severe aortic stenosis, spinal bifida, chronic opioid use, hx of Left knee septic arthritis, hx of difficult vascular access, who presented to ED for clotted R femoral dialysis catheter.   Assessment & Plan by Problem: Principal Problem:   Clotted dialysis access [facility]) Active Problems:   ESRD (end stage renal disease) on dialysis [facility])   Hypothyroidism  ESRD on dialysis Clotted dialysis access Patient has history of difficult vascular access.  CTA on 10/2019 showed central venous stenosis consistent with chronic dwelling catheter access.  Per notes, his Right tunnel catheter  is the last resort.  Consulted Dr. Carlis Abbott from vascular surgery and they will see the patient in a.m. -Continue Plavix and aspirin -Continue PhosLo   Right knee pain Patient was recently admitted on 12/09/2019 for right knee effusion.  Arthrocentesis showed WBC of 8000, no crystals, no organisms on Gram stain and culture.  Thought to be hemarthrosis rather than septic arthritis.  Patient presented today with right knee effusion, subjective fever/chills with significant tenderness with movement.  Will obtain right knee MRI to evaluate for effusion and any bony abnormalities.  If significant effusion, will consult orthopedic for arthrocentesis. -Follow-up on right knee MRI -Dilaudid for  pain   Hypothyroidism -Levothyroxine 137 mcg daily   Hyperlipidemia -Crestor 10 mg daily   Behavioral health -Ativan 1 mg daily -Lexapro 5 mg daily -Trazodone 100 mg daily   Code: Full VTE prophylaxis: Heparin  Dispo: Admit patient to Inpatient with expected length of stay greater than 2 midnights.  Signed: Gaylan Gerold, DO 01/06/2020, 4:13 AM  Pager: [phone] After 5pm on weekdays and 1pm on weekends: On Call pager: [phone]

## 2020-01-06 NOTE — Progress Notes (Addendum)
Subjective:   Patient evaluated at dialysis unit. Seemed to have good function of his dialysis canula at this time.   Continues to complain of R knee pain. No fevers, white count, or other clear indication of infection. He states that pain felt better after being drained at last admission, but the fluid gradually accumulated over time and limits his function, though he can ambulate with a walker. Agreeable to drainage of knee later today.  Objective:  Vital signs in last 24 hours: Vitals:   01/06/20 0245 01/06/20 0515 01/06/20 0545 01/06/20 0600  BP: 132/88  113/60 108/67  Pulse: 73 65 64 63  Resp: 16 11 16 12   Temp:      TempSrc:      SpO2: 98% 97% 91% 98%    Physical Exam Constitutional: appears uncomfortable Head: atraumatic ENT: external ears normal Eyes: EOMI Cardiovascular: regular rate and rhythm, normal heart sounds Pulmonary: effort normal, lungs clear to ascultation bilaterally Abdominal: flat, nontender, no rebound tenderness, bowel sounds normal Musculoskeletal: R knee is tight with swelling and is tender with ROM Skin: warm and dry Neurological: alert, no focal deficit Psychiatric: normal mood and affect  Assessment/Plan: [name] is a 55 y.o. male with hx of ESRD on MWF dialysis, HTN, CAD with mild 3 vessel disease, severe aortic stenosis, spina bifida, chronic opioid use, L knee septic arthritris presenting with dialysis access issue and L knee pain. Having fair flow through catheter now after placing tPA in tube, but nephro still recommending exchanging it.  Principal Problem:   Clotted dialysis access [facility]) Active Problems:   ESRD (end stage renal disease) on dialysis ([place])   Hypothyroidism  ESRD on MWF dialysis Impaired dialysis access Patient is well-known to the vascular surgery department and has had previous access to upper extremities in the left lower extremity, now has a right femoral tunneled dialysis catheter.  Has had issues with this  line before as well.  Was sent here from dialysis yesterday as they are only able to achieve flow of 150 (normal 450).  Appreciate assistance from vascular surgery, they offered to change the catheter but he wants to try using it again. Initially did not have good flow, but nephrology team placed tPA for 1 hour and he had fair flow at the time of our examination.  -Consulted vascular surgery, appreciate recs -Consulted nephro for dialysis, appreciate recs     -nephro recommends still exchanging the catheter due to current amount of flow. Plan to dialyze again tomorrow, evaluate flow at that time, and make decision then with VVD -dialysis today  -Continue home PhosLo -Daily BMP -Avoid nephrotoxins  Right knee pain, likely hemarthrosis He was admitted on 12/7 with pain and swelling of the same knee. Had 2 days of Abx but had MRI with normal bony appearance and synovial fluid showed 8000 WBCs, no crystals, negative Gram stain, and negative cultures. Was attributed to hemarthrosis, he is on ASA 325mg  and Plavix 75mg  at home. Has had continue right knee swelling, subjective fever, and tenderness with movement. MRI with large effusion with septations, no concerning bony changes and no clear infection. -drainage today for symptomatic relief -Dilaudid for pain control -outpatient f/u with orthopedic surgery on 1/10  CAD with severe AS HLD Cath in 10/2019 with mild (25%) 3 vessel disease and severe AS.  -Continue home aspirin 325 mg and Plavix 75 mg -Continue home Crestor 10 mg  Hypothyroidism -Continue home Synthroid 137 mcg  Anxiety Insomnia -Continue home Lexapro 5 mg -  Continue home trazodone 100 mg  Diet: N.p.o. for possible procedure IVF:  None VTE: Heparin Prior to Admission Living Arrangement: Home Anticipated Discharge Location: Home Barriers to Discharge: Dialysis access Dispo: Anticipated discharge in approximately 1-2 day(s).   [name], MD 01/06/2020, 7:16 AM Pager:  [phone] After 5pm on weekdays and 1pm on weekends: On Call pager [phone]

## 2020-01-06 NOTE — Consult Note (Signed)
[name] is a 55 year old male presenting due to clotted dialysis access. His past medical history includes End-Stage Renal Disease on hemodialysis, severe aortic stenosis, gastroesophageal reflux disease (GERD), and chronic opioid use. The team ordered famotidine 20mg  PO daily for GERD. Famotidine is a renally adjusted medication. Because the patient is on hemodialysis, I discussed to the team the dose change either 20mg  PO after hemodialysis on dialysis days or 10mg  PO daily. In rounds, the team agreed to change 10mg  daily. I consulted on the new dose and the reason for the dosing adjustment.  Linward Foster, Pharmacist Student.

## 2020-01-06 NOTE — Progress Notes (Addendum)
NEW ADMISSION NOTE  Arrival Method: bed Mental Orientation: Alert and oriented x4  Telemetry: yes Assessment: Completed Skin: see notes Iv: left forearm Pain: 8.5 Tubes: 0 Safety Measures: Safety Fall Prevention Plan has been given, discussed and signed Admission: Completed [facility] Orientation: Patient has been orientated to the room, unit and staff.  Family: 0  Orders have been reviewed and implemented. Will continue to monitor the patient. Call light has been placed within reach and bed alarm has been activated.   Beatris Ship, RN

## 2020-01-06 NOTE — Progress Notes (Signed)
PIV consult: Discussed with RN. VAST will await orders to decide on best access for ordered therapy.  Noted femoral catheter is not functioning properly, appears HD has attempted de-clotting. This RN is unable to locate recent confirmation of tip placement in EMR to rule out malpositioning.

## 2020-01-06 NOTE — Progress Notes (Signed)
Pt. HD catheter not functioning properly. [name], PA made aware orders for cathflo. Pt catheter cathflo x1hr. tx paused.

## 2020-01-06 NOTE — Procedures (Signed)
I was present at this dialysis session. I have reviewed the session itself and made appropriate changes.   Seen on HD. First attempt to use TDC not able to run. Placing tPA for 1h, and will reattempt. If still not functioning, VVS to attempt exchange tomorrow.  Labs ok.      Recent Labs  Lab 01/06/20 0334  NA 135  K 4.6  CL 93*  CO2 27  GLUCOSE 77  BUN 59*  CREATININE 9.56*  CALCIUM 8.7*  PHOS 5.2*    Recent Labs  Lab 01/05/20 1259 01/06/20 0334  WBC 5.5 4.2  NEUTROABS 4.3  --   HGB 9.2* 8.9*  HCT 31.9* 30.9*  MCV 96.1 96.6  PLT 143* 127*    Scheduled Meds: . alteplase      . aspirin  325 mg Oral Daily  . calcium acetate  2,668 mg Oral TID WC  . clopidogrel  75 mg Oral Daily  . escitalopram  5 mg Oral Daily  . famotidine  20 mg Oral Daily  . heparin  5,000 Units Subcutaneous Q8H  . levothyroxine  137 mcg Oral QAC breakfast  . LORazepam  1 mg Oral Daily  . promethazine  25 mg Intravenous Once  . rosuvastatin  10 mg Oral Daily  . traZODone  100 mg Oral QHS   Continuous Infusions: PRN Meds:.alteplase, HYDROmorphone (DILAUDID) injection, ondansetron (ZOFRAN) IV, senna   Pearson Grippe  MD 01/06/2020, 8:50 AM

## 2020-01-06 NOTE — ED Notes (Signed)
Dr Clark at bedside

## 2020-01-06 NOTE — ED Notes (Signed)
Per IV team, due to vein preservation, IV team will attempt access when admitting places orders, if pt gets bed before, IV team to meet pt on unit.

## 2020-01-06 NOTE — ED Notes (Signed)
IV team at bedside 

## 2020-01-06 NOTE — ED Provider Notes (Signed)
[facility] Provider Note   CSN: [id number] Arrival date & time: 01/05/20  1239     History Chief Complaint  Patient presents with  . clotted dialysis catheter  . Vascular Access Problem    [name] is a 55 y.o. male.  55 year old male with a history of end-stage renal disease on dialysis presents with a problem with his right femoral catheter.  The patient has no other access options and is told that this 1 might be able to be revised by his vascular surgeon.  He was at dialysis today and only received 1 or 2 hours of dialysis and sent here because the catheter is seem to be working appropriately.  Patient has no complaints of shortness of breath.  No other complaints at this time.     Past Medical History:  Diagnosis Date  . Anemia   . Anxiety   . Clotted dialysis access ([place]) 01/06/2020  . Constipation   . End stage renal failure on dialysis [facility])    [place]; MWF, on HD since 1997, exhausted upper ext access, and possibly LE access; cath dependent in R groin as of 06/2018  . GERD (gastroesophageal reflux disease)   . Grand mal seizure ([place]) X 4   last 1998 (02/29/2016)  . History of blood transfusion 2000s   "I was having blood loss; never found out from where"  . History of kidney stones   . Hypertension    hx of - has not taken bp meds in over 2 years  since dialysis  . Hypothyroidism   . Insomnia   . Migraine    "due to my BP in my late 1990s; none since" (02/29/2016)  . Nonhealing surgical wound    of the left arteriovenous graft  . Severe aortic stenosis    no problems with per pt. - nephrologist and Dr. Coletta Memos  . Spina bifida [facility])     Patient Active Problem List   Diagnosis Date Noted  . Hemarthrosis, right knee 12/10/2019  . Chest pressure   . Coronary artery calcification   . Chest pain   . Sepsis without acute organ dysfunction ([place])   . Vascular graft infection ([place])   . Wound infection   . Pyogenic arthritis of left  knee joint ([place])   . Pain and swelling of knee, left   . Complication associated with dialysis catheter   . Hyperkalemia   . Dysphagia   . Ureteral stone with hydronephrosis 03/26/2019  . Malfunction of nephrostomy tube ([place])   . Nephrostomy complication ([place]) 31/51/7616  . Abnormal CT scan, gastrointestinal tract   . Flank pain 11/26/2018  . Nephrolithiasis 11/26/2018  . Hydronephrosis with renal and ureteral calculus obstruction 11/26/2018  . Left flank pain 11/26/2018  . ESRD on dialysis ([place]) 06/12/2018  . PAD (peripheral artery disease) ([place]) 12/22/2017  . Problem with dialysis access ([place]) 12/21/2017  . Anemia 12/21/2017  . Hypothyroidism 12/21/2017  . GERD (gastroesophageal reflux disease) 12/21/2017  . Anxiety 12/21/2017  . Left great toe amputee ([place]) 12/19/2017  . Endocarditis of mitral valve   . Osteomyelitis of toe of left foot ([place])   . MSSA bacteremia 12/08/2017  . Fever   . Right flank pain   . ESRD (end stage renal disease) ([place]) 09/20/2017  . ESRD (end stage renal disease) on dialysis ([place]) 08/07/2017  . Nonhealing surgical wound 02/29/2016  . Clotted dialysis access ([place]) 01/14/2016  . Removal of staples 07/19/2012  . Complication of  AV dialysis fistula 06/27/2012  . End stage renal disease ([place]) 06/27/2012    Past Surgical History:  Procedure Laterality Date  . AMPUTATION Left 12/11/2017   Procedure: Left Great Toe Amputation;  Surgeon: [name], MD;  Location: [place];  Service: Orthopedics;  Laterality: Left;  . APPENDECTOMY    . APPLICATION OF WOUND VAC Left 10/04/2019   Procedure: APPLICATION OF WOUND VAC;  Surgeon: Serafina [place], MD;  Location: [place];  Service: Vascular;  Laterality: Left;  . ARTERIOVENOUS GRAFT PLACEMENT Left 10/16/2012   left femoral goretex graft         Dr Donnetta Hutching  . AV FISTULA PLACEMENT Left 06/27/2012   Procedure: EXPLORATORY LEFT THY-GRAFT PSEUDO-ANEURYSM;  Surgeon: [place], MD;  Location: [place];  Service: Vascular;   Laterality: Left;  Revision of left Arteriovenus gortex graft in thigh.  Bertram Savin REMOVAL Left 10/04/2019   Procedure: REMOVAL OF ARTERIOVENOUS GORETEX GRAFT ([place]);  Surgeon: Serafina Mitchell, MD;  Location: [facility] OR;  Service: Vascular;  Laterality: Left;  . COLONOSCOPY    . DRESSING CHANGE UNDER ANESTHESIA Left 10/07/2019   Procedure: DRESSING CHANGE UNDER ANESTHESIA;  Surgeon: Meredith Pel, MD;  Location: [place];  Service: Orthopedics;  Laterality: Left;  . FLEXIBLE SIGMOIDOSCOPY N/A 12/03/2018   Procedure: FLEXIBLE SIGMOIDOSCOPY;  Surgeon: Yetta Flock, MD;  Location: [place];  Service: Gastroenterology;  Laterality: N/A;  . I & D EXTREMITY Left 02/29/2016   Procedure: DEBRIDEMENT LEFT THIGH WOUND;  Surgeon: Waynetta Sandy, MD;  Location: [place];  Service: Vascular;  Laterality: Left;  . I & D EXTREMITY Left 10/04/2019   Procedure: IRRIGATION AND DEBRIDEMENT LEFT THIGH;  Surgeon: Serafina Mitchell, MD;  Location: [place];  Service: Vascular;  Laterality: Left;  . I & D EXTREMITY Right 12/09/2019   Procedure: IRRIGATION AND DEBRIDEMENT EXTREMITY;  Surgeon: Altamese [place], MD;  Location: [place];  Service: Orthopedics;  Laterality: Right;  . ILEOSTOMY  1970s  . INCISION AND DRAINAGE Left 10/16/2012   Procedure: INCISION AND Debridement left thigh graft;  Surgeon: Rosetta Posner, MD;  Location: [place];  Service: Vascular;  Laterality: Left;  . INSERTION OF DIALYSIS CATHETER    . INSERTION OF DIALYSIS CATHETER  01/14/2016   Procedure: INSERTION OF DIALYSIS CATHETER;  Surgeon: Waynetta Sandy, MD;  Location: [place];  Service: Vascular;;  . INSERTION OF DIALYSIS CATHETER Right 08/07/2017   Procedure: INSERTION OF DIALYSIS CATHETER;  Surgeon: Waynetta Sandy, MD;  Location: [place];  Service: Vascular;  Laterality: Right;  . INSERTION OF DIALYSIS CATHETER Right 12/21/2017   Procedure: RIGHT FEMORAL DIALYSIS CATHETER EXCHANGE;  Surgeon: Marty Heck, MD;  Location:  [place];  Service: Vascular;  Laterality: Right;  . INSERTION OF ILIAC STENT Left 01/14/2016   Procedure: INSERTION OF ILIAC STENT;  Surgeon: Waynetta Sandy, MD;  Location: [place];  Service: Vascular;  Laterality: Left;  . INTRAOPERATIVE ARTERIOGRAM Left 01/14/2016   Procedure: INTRA OPERATIVE ARTERIOGRAM;  Surgeon: Waynetta Sandy, MD;  Location: [place];  Service: Vascular;  Laterality: Left;  . IR NEPHROSTOMY EXCHANGE LEFT  03/21/2019  . IR NEPHROSTOMY PLACEMENT LEFT  11/28/2018  . IRRIGATION AND DEBRIDEMENT EXTREMITY (Right   12/09/2019  . KNEE ARTHROSCOPY Left 10/07/2019   Procedure: ARTHROSCOPY KNEE w/ STIMULATING  BEAD PLACMENT;  Surgeon: Meredith Pel, MD;  Location: [place];  Service: Orthopedics;  Laterality: Left;  . LEFT HEART CATH AND CORONARY ANGIOGRAPHY N/A 10/17/2019  Procedure: LEFT HEART CATH AND CORONARY ANGIOGRAPHY;  Surgeon: Jettie Booze, MD;  Location: [place] CV LAB;  Service: Cardiovascular;  Laterality: N/A;  . LITHOTRIPSY  x 3   "& a laser treatment" (02/29/2016)  . NEPHROLITHOTOMY Left 03/26/2019   Procedure: NEPHROLITHOTOMY PERCUTANEOUS;  Surgeon: Alexis Frock, MD;  Location: [facility];  Service: Urology;  Laterality: Left;  2 HRS  . NEPHROSTOMY Bilateral 1968  . PATELLAR TENDON REPAIR Right 1990s   "big incision"  . PERIPHERAL VASCULAR CATHETERIZATION  01/14/2016   Procedure: A/V SHUNTOGRAM;  Surgeon: Waynetta Sandy, MD;  Location: [place];  Service: Vascular;;  . REVISION OF ARTERIOVENOUS GORETEX GRAFT Left 10/16/2012   Procedure: REVISION OF LEFT FEMORAL LOOP ARTERIOVENOUS GORETEX GRAFT;  Surgeon: Rosetta Posner, MD;  Location: [place];  Service: Vascular;  Laterality: Left;  . REVISION OF ARTERIOVENOUS GORETEX GRAFT Left 02/11/2015   Procedure: EXCISION OF SMALL SEGMENT OF EXPOSED LEFT THIGH NON FUNCTIONING  ARTERIOVENOUS GORETEX GRAFT;  Surgeon: Mal Misty, MD;  Location: [place];  Service: Vascular;  Laterality: Left;  . REVISION  OF ARTERIOVENOUS GORETEX GRAFT Left 01/14/2016   Procedure: REVISION OF ARTERIOVENOUS GORETEX GRAFT;  Surgeon: Waynetta Sandy, MD;  Location: [place];  Service: Vascular;  Laterality: Left;  . REVISION OF ARTERIOVENOUS GORETEX GRAFT Left 02/29/2016   thigh/pt report  . REVISION OF ARTERIOVENOUS GORETEX GRAFT Left 02/29/2016   Procedure: POSSIBLE REVISION OF LEFT THIGH ARTERIOVENOUS GORETEX GRAFT;  Surgeon: Waynetta Sandy, MD;  Location: [place];  Service: Vascular;  Laterality: Left;  . TEE WITHOUT CARDIOVERSION N/A 12/14/2017   Procedure: TRANSESOPHAGEAL ECHOCARDIOGRAM (TEE);  Surgeon: Acie Fredrickson, Wonda Cheng, MD;  Location: [place];  Service: Cardiovascular;  Laterality: N/A;  . THROMBECTOMY AND REVISION OF ARTERIOVENTOUS (AV) GORETEX  GRAFT Left 06/12/2018   Procedure: Removal of infected left thigh graft;  Surgeon: Rosetta Posner, MD;  Location: [place];  Service: Vascular;  Laterality: Left;  . THROMBECTOMY W/ EMBOLECTOMY Left 01/14/2016   Procedure: THROMBECTOMY ARTERIOVENOUS GORE-TEX Left thigh GRAFT;  Surgeon: Waynetta Sandy, MD;  Location: [place];  Service: Vascular;  Laterality: Left;  . TONGUE SURGERY  ~ 1990   tongue-tie release   . ULTRASOUND GUIDANCE FOR VASCULAR ACCESS Right 08/07/2017   Procedure: ULTRASOUND GUIDANCE FOR VASCULAR CANNULATION RIGHT FEMORAL VEIN AND LEFT AV FEMORAL GRAFT;  Surgeon: Waynetta Sandy, MD;  Location: [place];  Service: Vascular;  Laterality: Right;  . UPPER EXTREMITY VENOGRAPHY Bilateral 08/09/2017   Procedure: UPPER EXTREMITY VENOGRAPHY;  Surgeon: Serafina [place], MD;  Location: [place] CV LAB;  Service: Cardiovascular;  Laterality: Bilateral;  . VENOGRAM N/A 09/20/2017   Procedure: RIGHT COMMON FEMORAL ARTERY EXPLORATION.  CANNULATION RIGHT COMMON FEMORAL VEIN. VENOGRAM CENTRAL ULTRA SOUND GUIDED RIGHT FEMORAL VEIN TIMES TWO.;  Surgeon: Waynetta [place], MD;  Location: [place];  Service: Vascular;  Laterality: N/A;  .  VENOGRAM Right 10/04/2019   Procedure: VENOGRAM;  Surgeon: Serafina Mitchell, MD;  Location: [place];  Service: Vascular;  Laterality: Right;  . WOUND DEBRIDEMENT Left 02/29/2016   thigh       Family History  Problem Relation Age of Onset  . Diabetes Mother   . Hypertension Mother   . Heart disease Mother        before age 36  . Diabetes Father   . Heart attack Father        X's 3  . Diabetes Sister   . Bipolar disorder Sister  Social History   Tobacco Use  . Smoking status: Never Smoker  . Smokeless tobacco: Never Used  Vaping Use  . Vaping Use: Never used  Substance Use Topics  . Alcohol use: Not Currently    Alcohol/week: 0.0 standard drinks    Comment: 02/29/2016 "nothing since  the mid 1990s  . Drug use: Not Currently    Types: Marijuana    Comment: 02/29/2016 "nothing since ~ 1995"    Home Medications Prior to Admission medications   Medication Sig Start Date End Date Taking? Authorizing Provider  acetaminophen (TYLENOL) 500 MG tablet Take 1,000 mg by mouth every 6 (six) hours as needed for mild pain or headache.   Yes [provider]  aspirin 325 MG tablet Take 325 mg by mouth daily.   Yes [provider]  calcium acetate (PHOSLO) 667 MG capsule Take 3-4 capsules (2,001-2,668 mg total) by mouth See admin instructions. Take 2708 mg by mouth 3 times daily with meals, take 2001 mg by mouth with snacks Patient taking differently: Take 2,668 mg by mouth 3 (three) times daily with meals. 01/14/18  Yes Hennie Duos, MD  clopidogrel (PLAVIX) 75 MG tablet Take 75 mg by mouth daily.   Yes [provider]  diclofenac Sodium (VOLTAREN) 1 % GEL Apply 2 g topically daily as needed (For leg pain).   Yes [provider]  famotidine (PEPCID) 40 MG tablet Take 40 mg by mouth daily. 11/03/19  Yes [provider]  fluticasone (FLONASE) 50 MCG/ACT nasal spray Place 2 sprays into both nostrils as needed for allergies or rhinitis  (congestion). Patient taking differently: Place 2 sprays into both nostrils daily as needed for allergies or rhinitis (congestion). 01/14/18  Yes Hennie Duos, MD  furosemide (LASIX) 80 MG tablet Take 80 mg by mouth 2 (two) times daily as needed for fluid.    Yes [provider]  levothyroxine (SYNTHROID) 137 MCG tablet Take 137 mcg by mouth daily before breakfast.   Yes [provider]  LORazepam (ATIVAN) 1 MG tablet Take 1 mg by mouth daily.   Yes [provider]  Nutritional Supplements (NEPRO) LIQD Take 237 mLs by mouth daily. Mixed Berry   Yes [provider]  oxyCODONE-acetaminophen (PERCOCET) 10-325 MG tablet Take 1 tablet by mouth every 6 (six) hours as needed for pain. Patient taking differently: Take 1 tablet by mouth every 6 (six) hours. 06/14/18  Yes Dagoberto Ligas, PA-C  promethazine (PHENERGAN) 25 MG tablet Take 1 tablet (25 mg total) by mouth 2 (two) times daily as needed for nausea or vomiting. 01/14/18  Yes Hennie Duos, MD  rosuvastatin (CRESTOR) 10 MG tablet Take 1 tablet (10 mg total) by mouth daily. 10/18/19 12/09/19 Yes Brimage, Vondra, DO  sodium polystyrene (KAYEXALATE) powder Take 15 g by mouth See admin instructions. Take 15 g (mixed in water) by mouth on Saturdays and Sundays 01/14/18  Yes Hennie Duos, MD  traZODone (DESYREL) 100 MG tablet Take 1 tablet (100 mg total) by mouth at bedtime. 01/14/18  Yes Hennie Duos, MD  zolpidem (AMBIEN) 10 MG tablet Take 1 tablet (10 mg total) by mouth at bedtime. 01/14/18  Yes Hennie Duos, MD  famotidine (PEPCID) 20 MG tablet Take 1 tablet (20 mg total) by mouth daily. Patient not taking: No sig reported 03/25/19   Gifford Shave, MD    Allergies    Hydrocodone, Colace [docusate], Furadantin [nitrofurantoin], Mandelamine [methenamine], Noroxin [norfloxacin], Sulfa antibiotics, and Sulfur  Review of  Systems   Review of Systems  All other systems reviewed and are  negative.   Physical Exam Updated Vital Signs BP 102/61 (BP Location: Right Arm)   Pulse 61   Temp 99.2 F (37.3 C) (Oral)   Resp 16   SpO2 96%   Physical Exam Vitals and nursing note reviewed.  Constitutional:      Appearance: He is well-developed and well-nourished.  HENT:     Head: Normocephalic and atraumatic.     Mouth/Throat:     Mouth: Mucous membranes are moist.  Eyes:     Pupils: Pupils are equal, round, and reactive to light.  Cardiovascular:     Rate and Rhythm: Normal rate.  Pulmonary:     Effort: Pulmonary effort is normal. No respiratory distress.  Abdominal:     General: There is no distension.  Musculoskeletal:        General: Normal range of motion.     Cervical back: Normal range of motion.  Skin:    General: Skin is warm and dry.  Neurological:     General: No focal deficit present.     Mental Status: He is alert.     ED Results / Procedures / Treatments   Labs (all labs ordered are listed, but only abnormal results are displayed) Labs Reviewed  COMPREHENSIVE METABOLIC PANEL - Abnormal; Notable for the following components:      Result Value   Chloride 91 (*)    Glucose, Bld 102 (*)    BUN 52 (*)    Creatinine, Ser 8.16 (*)    Calcium 8.8 (*)    Total Protein 8.3 (*)    Albumin 3.3 (*)    AST 11 (*)    GFR, Estimated 7 (*)    All other components within normal limits  CBC WITH DIFFERENTIAL/PLATELET - Abnormal; Notable for the following components:   RBC 3.32 (*)    Hemoglobin 9.2 (*)    HCT 31.9 (*)    MCHC 28.8 (*)    RDW 16.2 (*)    Platelets 143 (*)    Lymphs Abs 0.6 (*)    All other components within normal limits  CBC - Abnormal; Notable for the following components:   RBC 3.20 (*)    Hemoglobin 8.9 (*)    HCT 30.9 (*)    MCHC 28.8 (*)    RDW 16.3 (*)    Platelets 127 (*)    All other components within normal limits  RENAL FUNCTION PANEL - Abnormal; Notable for the following components:   Chloride 93 (*)    BUN 59 (*)     Creatinine, Ser 9.56 (*)    Calcium 8.7 (*)    Phosphorus 5.2 (*)    Albumin 3.1 (*)    GFR, Estimated 6 (*)    All other components within normal limits  RESP PANEL BY RT-PCR (FLU A&B, COVID) [id number]    EKG None  Radiology MR KNEE RIGHT WO CONTRAST  Result Date: 01/06/2020 CLINICAL DATA:  End-stage renal disease on hemodialysis. Right knee pain, instability and swelling. History of hemarthrosis. Recent arthroscopic knee surgery. EXAM: MRI OF THE RIGHT KNEE WITHOUT CONTRAST TECHNIQUE: Multiplanar, multisequence MR imaging of the knee was performed. No intravenous contrast was administered. COMPARISON:  MRI 12/17/2019 FINDINGS: MENISCI Medial meniscus:  Intact Lateral meniscus: Moderate degenerative changes and oblique coursing inferior articular surface tear involving the posterior horn mid body junction region. LIGAMENTS Cruciates:  Intact Collaterals:  Intact CARTILAGE Patellofemoral:  Moderate degenerative  chondrosis. Medial:  Moderate degenerative chondrosis. Lateral:  Moderate degenerative chondrosis. Joint: There is a moderate to large joint effusion with septations and some internal debris. Mild surrounding inflammatory changes but I do not see any obvious destructive cartilage changes or marrow edema to suggest septic arthritis. Joint aspiration may be helpful for further evaluation. Popliteal Fossa:  No popliteal mass or Baker's cyst. Extensor Mechanism: Severe chronic patellar tendinopathy and probable prior surgical changes. Patella [place] is noted with an elongated and diseased patellar tendon. The quadriceps tendon is intact. Bones: No acute bony findings. No findings suspicious for osteomyelitis. Other: Unremarkable knee musculature. No findings for myositis or pyomyositis. IMPRESSION: 1. Moderate to large joint effusion with septations and some internal debris. Mild surrounding inflammatory changes but I do not see any obvious destructive cartilage changes or marrow edema to suggest  septic arthritis. Joint aspiration may be helpful for further evaluation. 2. Intact ligamentous structures and no acute bony findings. 3. Degenerative changes and oblique coursing inferior articular surface tear involving the posterior horn mid body junction region of the lateral meniscus. 4. Severe chronic patellar tendinopathy and probable prior surgical changes. Electronically Signed   By: Marijo Sanes M.D.   On: 01/06/2020 08:37    Procedures Procedures (including critical care time)  Medications Ordered in ED Medications  ondansetron (ZOFRAN) injection 4 mg (4 mg Intravenous Given 01/06/20 2124)  HYDROmorphone (DILAUDID) injection 0.5 mg (0.5 mg Intravenous Given 01/06/20 2258)  senna (SENOKOT) tablet 8.6 mg (has no administration in time range)  aspirin tablet 325 mg (325 mg Oral Given 01/06/20 1637)  levothyroxine (SYNTHROID) tablet 137 mcg (137 mcg Oral Not Given 01/06/20 0800)  calcium acetate (PHOSLO) capsule 2,668 mg (2,668 mg Oral Given 01/06/20 1755)  clopidogrel (PLAVIX) tablet 75 mg (75 mg Oral Not Given 01/06/20 1000)  heparin injection 5,000 Units (5,000 Units Subcutaneous Given 01/06/20 2124)  escitalopram (LEXAPRO) tablet 5 mg (5 mg Oral Given 01/06/20 1637)  LORazepam (ATIVAN) tablet 1 mg (1 mg Oral Given 01/06/20 1637)  rosuvastatin (CRESTOR) tablet 10 mg (10 mg Oral Given 01/06/20 1637)  traZODone (DESYREL) tablet 100 mg (100 mg Oral Given 01/06/20 2124)  alteplase (CATHFLO ACTIVASE) injection 6 mg (has no administration in time range)  famotidine (PEPCID) tablet 10 mg (has no administration in time range)  Chlorhexidine Gluconate Cloth 2 % PADS 6 each (6 each Topical Given 01/06/20 1639)  acetaminophen (TYLENOL) tablet 650 mg (650 mg Oral Given 01/06/20 1638)  alteplase (CATHFLO ACTIVASE) injection 6 mg (6 mg Intracatheter Given 01/06/20 0854)  promethazine (PHENERGAN) injection 25 mg (25 mg Intravenous Given 01/06/20 0915)  promethazine (PHENERGAN) 25 MG/ML injection (  Duplicate 05/02/00 5852)     ED Course  I have reviewed the triage vital signs and the nursing notes.  Pertinent labs & imaging results that were available during my care of the patient were reviewed by me and considered in my medical decision making (see chart for details).    MDM Rules/Calculators/A&P                          I discussed with Dr. Carlis Abbott who is a vascular surgeon on-call.  He states that he is familiar with this patient and the best course of action would be to admit him so they can work on getting this changed out while he is in the hospital.  I discussed with the internal medicine team and he will be admitted for the same.  Final  Clinical Impression(s) / ED Diagnoses Final diagnoses:  ESRD on dialysis [facility])  Complication of vascular access for dialysis, initial encounter    Rx / DC Orders ED Discharge Orders    None       Gurnie Duris, Corene Cornea, MD 01/06/20 2355

## 2020-01-07 ENCOUNTER — Encounter (HOSPITAL_COMMUNITY)
Admission: EM | Disposition: A | Discharge status: Home Health | Payer: Self-pay | Source: Home / Self Care | Attending: Student in an Organized Health Care Education/Training Program

## 2020-01-07 DIAGNOSIS — E039 Hypothyroidism, unspecified: Secondary | ICD-10-CM | POA: Diagnosis not present

## 2020-01-07 DIAGNOSIS — G4700 Insomnia, unspecified: Secondary | ICD-10-CM | POA: Diagnosis not present

## 2020-01-07 DIAGNOSIS — T8249XA Other complication of vascular dialysis catheter, initial encounter: Secondary | ICD-10-CM | POA: Diagnosis not present

## 2020-01-07 DIAGNOSIS — M25561 Pain in right knee: Secondary | ICD-10-CM | POA: Diagnosis not present

## 2020-01-07 LAB — CBC
HCT: 32.2 % — ABNORMAL LOW (ref 39.0–52.0)
Hemoglobin: 9.7 g/dL — ABNORMAL LOW (ref 13.0–17.0)
MCH: 28.1 pg (ref 26.0–34.0)
MCHC: 30.1 g/dL (ref 30.0–36.0)
MCV: 93.3 fL (ref 80.0–100.0)
Platelets: 145 10*3/uL — ABNORMAL LOW (ref 150–400)
RBC: 3.45 MIL/uL — ABNORMAL LOW (ref 4.22–5.81)
RDW: 16 % — ABNORMAL HIGH (ref 11.5–15.5)
WBC: 4.7 10*3/uL (ref 4.0–10.5)
nRBC: 0 % (ref 0.0–0.2)

## 2020-01-07 LAB — SYNOVIAL CELL COUNT + DIFF, W/ CRYSTALS
Eosinophils-Synovial: 0 % (ref 0–1)
Lymphocytes-Synovial Fld: 4 % (ref 0–20)
Monocyte-Macrophage-Synovial Fluid: 17 % — ABNORMAL LOW (ref 50–90)
Neutrophil, Synovial: 79 % — ABNORMAL HIGH (ref 0–25)
WBC, Synovial: 44250 /mm3 — ABNORMAL HIGH (ref 0–200)

## 2020-01-07 LAB — RENAL FUNCTION PANEL
Albumin: 3.2 g/dL — ABNORMAL LOW (ref 3.5–5.0)
Anion gap: 14 (ref 5–15)
BUN: 45 mg/dL — ABNORMAL HIGH (ref 6–20)
CO2: 25 mmol/L (ref 22–32)
Calcium: 9.1 mg/dL (ref 8.9–10.3)
Chloride: 95 mmol/L — ABNORMAL LOW (ref 98–111)
Creatinine, Ser: 7.79 mg/dL — ABNORMAL HIGH (ref 0.61–1.24)
GFR, Estimated: 8 mL/min — ABNORMAL LOW (ref >60)
Glucose, Bld: 86 mg/dL (ref 70–99)
Phosphorus: 5.1 mg/dL — ABNORMAL HIGH (ref 2.5–4.6)
Potassium: 4.6 mmol/L (ref 3.5–5.1)
Sodium: 134 mmol/L — ABNORMAL LOW (ref 135–145)

## 2020-01-07 SURGERY — INSERTION OF DIALYSIS CATHETER
Anesthesia: General

## 2020-01-07 MED ORDER — PREDNISONE 20 MG PO TABS
40.0000 mg | ORAL_TABLET | Freq: Every day | ORAL | Status: DC
  Administered 2020-01-08 – 2020-01-11 (×3): 40 mg via ORAL
  Filled 2020-01-07 (×4): qty 2

## 2020-01-07 MED ORDER — HEPARIN SODIUM (PORCINE) 1000 UNIT/ML IJ SOLN
INTRAMUSCULAR | Status: AC
  Filled 2020-01-07: qty 4

## 2020-01-07 MED ORDER — HYDROMORPHONE HCL 1 MG/ML IJ SOLN
1.0000 mg | Freq: Four times a day (QID) | INTRAMUSCULAR | Status: DC | PRN
  Administered 2020-01-07 – 2020-01-08 (×3): 1 mg via INTRAVENOUS
  Filled 2020-01-07 (×3): qty 1

## 2020-01-07 MED ORDER — ZOLPIDEM TARTRATE 5 MG PO TABS
5.0000 mg | ORAL_TABLET | Freq: Every evening | ORAL | Status: DC | PRN
  Administered 2020-01-07: 5 mg via ORAL
  Filled 2020-01-07 (×2): qty 1

## 2020-01-07 MED ORDER — FAMOTIDINE 20 MG PO TABS
20.0000 mg | ORAL_TABLET | ORAL | Status: DC
Start: 2020-01-09 — End: 2020-01-17
  Administered 2020-01-09 – 2020-01-15 (×5): 20 mg via ORAL
  Filled 2020-01-07 (×6): qty 1

## 2020-01-07 MED ORDER — DICLOFENAC SODIUM 1 % EX GEL
4.0000 g | Freq: Four times a day (QID) | CUTANEOUS | Status: DC | PRN
  Administered 2020-01-07 – 2020-01-14 (×11): 4 g via TOPICAL
  Filled 2020-01-07 (×3): qty 100

## 2020-01-07 MED ORDER — PROMETHAZINE HCL 25 MG/ML IJ SOLN
12.5000 mg | Freq: Once | INTRAMUSCULAR | Status: AC
  Administered 2020-01-07: 12.5 mg via INTRAVENOUS
  Filled 2020-01-07: qty 1

## 2020-01-07 MED ORDER — PROMETHAZINE HCL 25 MG/ML IJ SOLN
INTRAMUSCULAR | Status: AC
  Filled 2020-01-07: qty 1

## 2020-01-07 MED ORDER — PROMETHAZINE HCL 25 MG/ML IJ SOLN
25.0000 mg | Freq: Once | INTRAMUSCULAR | Status: AC
  Administered 2020-01-07: 25 mg via INTRAVENOUS

## 2020-01-07 MED ORDER — ALTEPLASE 2 MG IJ SOLR
INTRAMUSCULAR | Status: AC
  Filled 2020-01-07: qty 6

## 2020-01-07 NOTE — Plan of Care (Signed)
  Problem: Education: Goal: Knowledge of General Education information will improve Description: Including pain rating scale, medication(s)/side effects and non-pharmacologic comfort measures Outcome: Progressing   Problem: Pain Managment: Goal: General experience of comfort will improve Outcome: Progressing   

## 2020-01-07 NOTE — Progress Notes (Addendum)
[facility] Progress Note   Subjective:  Seen in room - c/o knee pain. Was able to be dialyzed using [facility] yesterday after another cathflo treatment, BFR 250 range. Pt would like to avoid [facility] replacement if at all possible. TDC lumens locked with tPA overnight, plan is to dialyze again today to assure [facility] remains patent - if so, likely can be discharged home afterwards.    Objective Vitals:   01/06/20 2344 01/07/20 0504 01/07/20 0558 01/07/20 0902  BP: 102/61 (!) 111/57  127/67  Pulse: 61 (!) 59 62 64  Resp: 16 16  19   Temp: 99.2 F (37.3 C) 99.3 F (37.4 C)  99.7 F (37.6 C)  TempSrc: Oral Oral  Oral  SpO2: 96% 97%  99%   Physical Exam General: Well appearing man, NAD. Room air Heart: RRR; no murmur Lungs: CTAB Abdomen: soft, ostomy bag in place Extremities: Trace LE edema; swollen R knee joint Dialysis Access: TDC in R groin  Additional Objective Labs: Basic Metabolic Panel: Recent Labs  Lab 01/05/20 1259 01/06/20 0334 01/07/20 0801  NA 135 135 134*  K 4.4 4.6 4.6  CL 91* 93* 95*  CO2 29 27 25   GLUCOSE 102* 77 86  BUN 52* 59* 45*  CREATININE 8.16* 9.56* 7.79*  CALCIUM 8.8* 8.7* 9.1  PHOS  --  5.2* 5.1*   Liver Function Tests: Recent Labs  Lab 01/05/20 1259 01/06/20 0334 01/07/20 0801  AST 11*  --   --   ALT 9  --   --   ALKPHOS 102  --   --   BILITOT 0.9  --   --   PROT 8.3*  --   --   ALBUMIN 3.3* 3.1* 3.2*   CBC: Recent Labs  Lab 01/05/20 1259 01/06/20 0334 01/07/20 0801  WBC 5.5 4.2 4.7  NEUTROABS 4.3  --   --   HGB 9.2* 8.9* 9.7*  HCT 31.9* 30.9* 32.2*  MCV 96.1 96.6 93.3  PLT 143* 127* 145*   Studies/Results: MR KNEE RIGHT WO CONTRAST  Result Date: 01/06/2020 CLINICAL DATA:  End-stage renal disease on hemodialysis. Right knee pain, instability and swelling. History of hemarthrosis. Recent arthroscopic knee surgery. EXAM: MRI OF THE RIGHT KNEE WITHOUT CONTRAST TECHNIQUE: Multiplanar, multisequence MR imaging of the knee was  performed. No intravenous contrast was administered. COMPARISON:  MRI 12/17/2019 FINDINGS: MENISCI Medial meniscus:  Intact Lateral meniscus: Moderate degenerative changes and oblique coursing inferior articular surface tear involving the posterior horn mid body junction region. LIGAMENTS Cruciates:  Intact Collaterals:  Intact CARTILAGE Patellofemoral:  Moderate degenerative chondrosis. Medial:  Moderate degenerative chondrosis. Lateral:  Moderate degenerative chondrosis. Joint: There is a moderate to large joint effusion with septations and some internal debris. Mild surrounding inflammatory changes but I do not see any obvious destructive cartilage changes or marrow edema to suggest septic arthritis. Joint aspiration may be helpful for further evaluation. Popliteal Fossa:  No popliteal mass or Baker's cyst. Extensor Mechanism: Severe chronic patellar tendinopathy and probable prior surgical changes. Patella [place] is noted with an elongated and diseased patellar tendon. The quadriceps tendon is intact. Bones: No acute bony findings. No findings suspicious for osteomyelitis. Other: Unremarkable knee musculature. No findings for myositis or pyomyositis. IMPRESSION: 1. Moderate to large joint effusion with septations and some internal debris. Mild surrounding inflammatory changes but I do not see any obvious destructive cartilage changes or marrow edema to suggest septic arthritis. Joint aspiration may be helpful for further evaluation. 2. Intact ligamentous structures and  no acute bony findings. 3. Degenerative changes and oblique coursing inferior articular surface tear involving the posterior horn mid body junction region of the lateral meniscus. 4. Severe chronic patellar tendinopathy and probable prior surgical changes. Electronically Signed   By: Marijo Sanes M.D.   On: 01/06/2020 08:37   Medications:  . aspirin  325 mg Oral Daily  . calcium acetate  2,668 mg Oral TID WC  . Chlorhexidine Gluconate Cloth   6 each Topical Daily  . clopidogrel  75 mg Oral Daily  . escitalopram  5 mg Oral Daily  . famotidine  10 mg Oral Daily  . heparin  5,000 Units Subcutaneous Q8H  . heparin sodium (porcine)      . levothyroxine  137 mcg Oral QAC breakfast  . LORazepam  1 mg Oral Daily  . rosuvastatin  10 mg Oral Daily  . traZODone  100 mg Oral QHS    Dialysis Orders: MWF at [facility] 4:45hr, 200 dialyzer, 350/800, EDW 117kg, 2K/2.25Ca, TDC, heparin 3700 bolus - Sensipar 30mg  PO q HD - Venofer 50mg  IV q week - Mircera 216mcg IV q 2 weeks (last 1/3)  Assessment/Plan: 1.  Dysfunctional TDC: Able to eventually get running 01/06/20, but with BFR 250. Pt has decided to hold off on [facility] exchange for now. Will dialyze again today to assure [facility] remains functional, then likely can be discharged afterwards. 2.  ESRD: Continue HD per MWF schedule - HD today, as above. 3.  Hypertension/volume: BP stable, s/p 5L UF yesterday, lower goal today. 4.  Anemia: Hgb 9.7 - just given ESA, follow. 5.  Metabolic bone disease: Ca/Phos ok - continue home binders. 6.  Spina bifida 7.  Chronic pain syndrome R knee effusion: Per primary.  Addendum (10:15a): Tmax 99.9 overnight and T100 this morning. Blood Cx ordered. S/p R knee tap as well with cell count/gram stain/Cx pending. [place] working today - HD initiated, BFR 300. [place], PA-C 01/07/2020, 9:39 AM  [employer]

## 2020-01-07 NOTE — Progress Notes (Addendum)
  Progress Note    01/07/2020 7:56 AM * No surgery date entered *  Subjective: complaining of right knee pain. States regarding catheter he feels like it is working adequately and he is satisfied with continuing to use it as is but says he is willing to do whatever to keep this access option working   Vitals:   01/07/20 0504 01/07/20 0558  BP: (!) 111/57   Pulse: (!) 59 62  Resp: 16   Temp: 99.3 F (37.4 C)   SpO2: 97%    Physical Exam: Cardiac: regular Lungs: non labored Extremities: moving all extremities without deficits Neurologic: alert and oriented  CBC    Component Value Date/Time   WBC 4.2 01/06/2020 0334   RBC 3.20 (L) 01/06/2020 0334   HGB 8.9 (L) 01/06/2020 0334   HCT 30.9 (L) 01/06/2020 0334   PLT 127 (L) 01/06/2020 0334   MCV 96.6 01/06/2020 0334   MCH 27.8 01/06/2020 0334   MCHC 28.8 (L) 01/06/2020 0334   RDW 16.3 (H) 01/06/2020 0334   LYMPHSABS 0.6 (L) 01/05/2020 1259   MONOABS 0.5 01/05/2020 1259   EOSABS 0.1 01/05/2020 1259   BASOSABS 0.0 01/05/2020 1259    BMET    Component Value Date/Time   NA 135 01/06/2020 0334   NA 135 (A) 01/17/2018 0000   K 4.6 01/06/2020 0334   CL 93 (L) 01/06/2020 0334   CO2 27 01/06/2020 0334   GLUCOSE 77 01/06/2020 0334   BUN 59 (H) 01/06/2020 0334   BUN 67 (A) 01/17/2018 0000   CREATININE 9.56 (H) 01/06/2020 0334   CALCIUM 8.7 (L) 01/06/2020 0334   GFRNONAA 6 (L) 01/06/2020 0334   GFRAA 10 (L) 10/07/2019 0430    INR    Component Value Date/Time   INR 1.1 12/11/2019 1014     Intake/Output Summary (Last 24 hours) at 01/07/2020 0756 Last data filed at 01/07/2020 0600 Gross per 24 hour  Intake 600 ml  Output 5000 ml  Net -4400 ml     Assessment/Plan:  55 y.o. male with a malfunctioning right femoral tunneled dialysis catheter. Catheter lumens were treated yesterday with cathflo and HD was able to run. BFR low. He is scheduled for HD again today. Will see how he is able to run today. Currently holding off  on catheter exchange unless there are more issues with dialyzing today   DVT prophylaxis: sq heparin    Karoline Caldwell, PA-C [facility] [phone] 01/07/2020 7:56 AM   I have seen and evaluated the patient. I agree with the PA note as documented above.  Patient canceled for OR today given the tunneled right femoral dialysis catheter was working adequate enough for him to complete his dialysis session yesterday.  He has very limited options moving forward as previously noted.  Plan is to dialyze him again today before discharge.  Marty Heck, MD [facility] [place] Office: [phone]

## 2020-01-07 NOTE — Progress Notes (Signed)
Subjective:   Patient evaluated at bedside. He complains of worsening R knee pain. Is frustrated with several issues. Notes that he would like a different orthopedic surgeon that Dr. Marcelino Scot if he needs one.   Knee effusion drained at bedside, 40cc of cloudy serosanguinous fluid removed.  Objective:  Vital signs in last 24 hours: Vitals:   01/06/20 1950 01/06/20 2344 01/07/20 0504 01/07/20 0558  BP: 110/60 102/61 (!) 111/57   Pulse: 67 61 (!) 59 62  Resp: 18 16 16    Temp: 99.9 F (37.7 C) 99.2 F (37.3 C) 99.3 F (37.4 C)   TempSrc: Oral Oral Oral   SpO2: 98% 96% 97%     Physical Exam Constitutional: appears uncomfortable Head: atraumatic ENT: external ears normal Eyes: EOMI Cardiovascular: regular rate and rhythm, normal heart sounds Pulmonary: effort normal Abdominal: flat Musculoskeletal: R knee effusion is decreased after aspiration, remains somewhat tender, bleeding stopped Skin: warm and dry Neurological: alert, no focal deficit Psychiatric: normal mood and affect  Assessment/Plan: [name] is a 55 y.o. male with hx of ESRD on MWF dialysis, HTN, CAD with mild 3 vessel disease, severe aortic stenosis, spina bifida, chronic opioid use, L knee septic arthritris presenting with dialysis access issue and L knee pain. Having decreased but adequate flow through catheter now after placing tPA in tube, but nephro still recommending exchange.  Principal Problem:   Clotted dialysis access [facility]) Active Problems:   ESRD (end stage renal disease) on dialysis ([place])   Hypothyroidism   Hemarthrosis, right knee  ESRD on MWF dialysis Impaired dialysis access Patient is well-known to the vascular surgery department and has had previous access to upper extremities and in the left lower extremity, now has a right femoral tunneled dialysis catheter.  Has had issues with this line before as well.  Was sent here from dialysis yesterday as they are only able to achieve flow of  150 (normal 450).  Appreciate assistance from vascular surgery, they offered to change the catheter but he wants to try using it again. Initially did not have good flow, but nephrology team placed tPA for 1 hour and he had fair flow at the time of our examination.  -Consulted vascular surgery, appreciate recs -Consulted nephro for dialysis, appreciate recs     -nephro recommends still exchanging the catheter due to current amount of flow. Plan to dialyze again today, evaluate flow, and make decision then with VVS -dialysis today  -Continue home PhosLo -Daily BMP -Avoid nephrotoxins  Right knee pain, likely hemarthrosis He was admitted on 12/7 with pain and swelling of the same knee. Had 2 days of Abx but had MRI with normal bony appearance and synovial fluid showed 8000 WBCs, no crystals, negative Gram stain, and negative cultures. Was attributed to hemarthrosis, he is on ASA 325mg  and Plavix 75mg  at home. Has had continue right knee swelling, subjective fever, and tenderness with movement. MRI with large effusion with septations, no concerning bony changes and no clear infection. Drained today. -f/u knee aspiration labs -increase Dilaudid to 1mg  for pain control -outpatient f/u with orthopedic surgery on 1/10  Fever New fever last night of 100.8. No leukocytosis. Possible sources include catheter line infection and R knee with effusion.  -f/u knee aspiration labs as above -f/u blood culture -daily CBC  CAD with severe AS HLD Cath in 10/2019 with mild (25%) 3 vessel disease and severe AS.  -Continue home aspirin 325 mg and Plavix 75 mg -Continue home Crestor 10 mg  Hypothyroidism -  Continue home Synthroid 137 mcg  Anxiety Insomnia -Continue home Lexapro 5 mg -Continue home trazodone 100 mg  Diet: N.p.o. for possible procedure IVF:  None VTE: Heparin Prior to Admission Living Arrangement: Home Anticipated Discharge Location: Home Barriers to Discharge: Dialysis access,  infectious workup Dispo: Anticipated discharge in approximately 0-1 day(s).   [name], MD 01/07/2020, 6:13 AM Pager: [phone] After 5pm on weekdays and 1pm on weekends: On Call pager [phone]

## 2020-01-07 NOTE — Procedures (Signed)
   After informed written consent patient was lying supine on exam table.  Right knee was prepped with iodine.  Utilizing superomedial approach, 5 mL of lidocaine was used for local anesthesia.  Then using an 18g needle on 60cc syringe, 40 mL of cloudy red-colored fluid was aspirated from right knee.  Patient tolerated procedure well without immediate complications   [place], DO [phone] 01/07/2020 3:17 PM

## 2020-01-07 NOTE — Plan of Care (Signed)
  Problem: Fluid Volume: Goal: Compliance with measures to maintain balanced fluid volume will improve Outcome: Progressing   Problem: Nutritional: Goal: Ability to make healthy dietary choices will improve Outcome: Progressing   Problem: Clinical Measurements: Goal: Complications related to the disease process, condition or treatment will be avoided or minimized Outcome: Progressing

## 2020-01-08 DIAGNOSIS — G4700 Insomnia, unspecified: Secondary | ICD-10-CM | POA: Diagnosis not present

## 2020-01-08 DIAGNOSIS — M25561 Pain in right knee: Secondary | ICD-10-CM | POA: Diagnosis not present

## 2020-01-08 DIAGNOSIS — T8249XA Other complication of vascular dialysis catheter, initial encounter: Secondary | ICD-10-CM | POA: Diagnosis not present

## 2020-01-08 DIAGNOSIS — E039 Hypothyroidism, unspecified: Secondary | ICD-10-CM | POA: Diagnosis not present

## 2020-01-08 LAB — CBC
HCT: 31.8 % — ABNORMAL LOW (ref 39.0–52.0)
Hemoglobin: 9.3 g/dL — ABNORMAL LOW (ref 13.0–17.0)
MCH: 28.2 pg (ref 26.0–34.0)
MCHC: 29.2 g/dL — ABNORMAL LOW (ref 30.0–36.0)
MCV: 96.4 fL (ref 80.0–100.0)
Platelets: 177 10*3/uL (ref 150–400)
RBC: 3.3 MIL/uL — ABNORMAL LOW (ref 4.22–5.81)
RDW: 16.2 % — ABNORMAL HIGH (ref 11.5–15.5)
WBC: 5.5 10*3/uL (ref 4.0–10.5)
nRBC: 0 % (ref 0.0–0.2)

## 2020-01-08 LAB — RENAL FUNCTION PANEL
Albumin: 2.9 g/dL — ABNORMAL LOW (ref 3.5–5.0)
Anion gap: 13 (ref 5–15)
BUN: 40 mg/dL — ABNORMAL HIGH (ref 6–20)
CO2: 28 mmol/L (ref 22–32)
Calcium: 9.1 mg/dL (ref 8.9–10.3)
Chloride: 95 mmol/L — ABNORMAL LOW (ref 98–111)
Creatinine, Ser: 6.97 mg/dL — ABNORMAL HIGH (ref 0.61–1.24)
GFR, Estimated: 9 mL/min — ABNORMAL LOW (ref >60)
Glucose, Bld: 125 mg/dL — ABNORMAL HIGH (ref 70–99)
Phosphorus: 4 mg/dL (ref 2.5–4.6)
Potassium: 4.2 mmol/L (ref 3.5–5.1)
Sodium: 136 mmol/L (ref 135–145)

## 2020-01-08 MED ORDER — HYDROMORPHONE HCL 1 MG/ML IJ SOLN: 1.5000 mg | Freq: Four times a day (QID) | INTRAMUSCULAR | Status: DC | PRN

## 2020-01-08 MED ORDER — HYDROMORPHONE HCL 1 MG/ML IJ SOLN
1.5000 mg | INTRAMUSCULAR | Status: DC | PRN
  Administered 2020-01-08 – 2020-01-11 (×15): 1.5 mg via INTRAVENOUS
  Filled 2020-01-08 (×15): qty 2

## 2020-01-08 MED ORDER — ZOLPIDEM TARTRATE 5 MG PO TABS
10.0000 mg | ORAL_TABLET | Freq: Every day | ORAL | Status: DC
  Administered 2020-01-08 – 2020-01-15 (×8): 10 mg via ORAL
  Filled 2020-01-08 (×8): qty 2

## 2020-01-08 MED ORDER — PROMETHAZINE HCL 25 MG/ML IJ SOLN
12.5000 mg | Freq: Three times a day (TID) | INTRAMUSCULAR | Status: DC | PRN
  Administered 2020-01-08 – 2020-01-10 (×6): 12.5 mg via INTRAVENOUS
  Filled 2020-01-08 (×5): qty 1

## 2020-01-08 NOTE — Plan of Care (Signed)
  Problem: Education: Goal: Knowledge of General Education information will improve Description Including pain rating scale, medication(s)/side effects and non-pharmacologic comfort measures Outcome: Progressing   Problem: Education: Goal: Knowledge of General Education information will improve Description Including pain rating scale, medication(s)/side effects and non-pharmacologic comfort measures Outcome: Progressing   

## 2020-01-08 NOTE — Progress Notes (Signed)
[facility] Progress Note   Subjective:  Seen in room - c/o severe knee pain and nausea. No CP/dyspnea. HD went great yesterday - TDC ran well with BFR 350. Febrile yesterday, Tmax 99.8 overnight. BCx and synovial fluid Cx 1/5 both negtaive so far, although significant WBCs from knee tap.  Objective Vitals:   01/07/20 1736 01/07/20 2108 01/08/20 0501 01/08/20 0942  BP: (!) 92/42 (!) 91/47 (!) 90/52 111/62  Pulse: 72 68 60 67  Resp: 18   19  Temp: 100.1 F (37.8 C) 99.8 F (37.7 C) 99.1 F (37.3 C) 99.3 F (37.4 C)  TempSrc: Oral Oral Oral Oral  SpO2: 95% 95% 96% 98%  Weight:  118.9 kg     Physical Exam General: Well appearing man, NAD. Room air Heart: RRR; no murmur Lungs: CTAB Abdomen: soft, ostomy bag in place Extremities: Trace LE edema; swollen R knee joint Dialysis Access: TDC in R groin  Additional Objective Labs: Basic Metabolic Panel: Recent Labs  Lab 01/06/20 0334 01/07/20 0801 01/08/20 0242  NA 135 134* 136  K 4.6 4.6 4.2  CL 93* 95* 95*  CO2 27 25 28   GLUCOSE 77 86 125*  BUN 59* 45* 40*  CREATININE 9.56* 7.79* 6.97*  CALCIUM 8.7* 9.1 9.1  PHOS 5.2* 5.1* 4.0   Liver Function Tests: Recent Labs  Lab 01/05/20 1259 01/06/20 0334 01/07/20 0801 01/08/20 0242  AST 11*  --   --   --   ALT 9  --   --   --   ALKPHOS 102  --   --   --   BILITOT 0.9  --   --   --   PROT 8.3*  --   --   --   ALBUMIN 3.3* 3.1* 3.2* 2.9*   CBC: Recent Labs  Lab 01/05/20 1259 01/06/20 0334 01/07/20 0801 01/08/20 0242  WBC 5.5 4.2 4.7 5.5  NEUTROABS 4.3  --   --   --   HGB 9.2* 8.9* 9.7* 9.3*  HCT 31.9* 30.9* 32.2* 31.8*  MCV 96.1 96.6 93.3 96.4  PLT 143* 127* 145* 177   Blood Culture    Component Value Date/Time   SDES BLOOD LEFT HAND 01/07/2020 1547   SPECREQUEST  01/07/2020 1547    BOTTLES DRAWN AEROBIC AND ANAEROBIC Blood Culture adequate volume   CULT  01/07/2020 1547    NO GROWTH < 24 HOURS Performed at [facility], [place]  [address] [place], [place] [address]    REPTSTATUS PENDING 01/07/2020 1547   Medications:  . aspirin  325 mg Oral Daily  . calcium acetate  2,668 mg Oral TID WC  . Chlorhexidine Gluconate Cloth  6 each Topical Daily  . clopidogrel  75 mg Oral Daily  . escitalopram  5 mg Oral Daily  . [START ON 01/09/2020] famotidine  20 mg Oral QODAY  . heparin  5,000 Units Subcutaneous Q8H  . levothyroxine  137 mcg Oral QAC breakfast  . LORazepam  1 mg Oral Daily  . predniSONE  40 mg Oral Q breakfast  . rosuvastatin  10 mg Oral Daily  . traZODone  100 mg Oral QHS    Dialysis Orders: MWF at [facility] 4:45hr, 200 dialyzer, 350/800, EDW 117kg, 2K/2.25Ca, TDC, heparin 3700 bolus - Sensipar 30mg  PO q HD - Venofer 50mg  IV q week - Mircera 260mcg IV q 2 weeks (last 1/3)  Assessment/Plan: 1. Dysfunctional TDC: Able to eventually get running 01/06/20, but with BFR 250. Pt has decided  to hold off on [facility] exchange for now. Dialyzed 1/5 and TDC ran well with BFR 350. No vascular surgery procedures anticipated at this time. 2. ESRD:Continue HD per MWF schedule - next HD 1/7. 3. Hypertension/volume:BP low/stable, close to EDW. 4. Anemia:Hgb 9.3 - just given ESA, follow. 5. Metabolic bone disease:Ca/Phos ok - continue home binders. 6. Spina bifida 7. Chronic pain syndrome 8.  R knee effusion: S/p joint aspiration 1/5. Per primary. 9. Low-grade fever: BCx and synovial fluid Cx on 1/5 negative so far. Per primary.  [name], PA-C 01/08/2020, 10:36 AM  [employer]

## 2020-01-08 NOTE — Plan of Care (Signed)
  Problem: Clinical Measurements: Goal: Will remain free from infection Outcome: Progressing   Problem: Pain Managment: Goal: General experience of comfort will improve Outcome: Progressing   Problem: Skin Integrity: Goal: Risk for impaired skin integrity will decrease Outcome: Progressing   Problem: Fluid Volume: Goal: Compliance with measures to maintain balanced fluid volume will improve Outcome: Progressing

## 2020-01-08 NOTE — Progress Notes (Addendum)
Subjective:   Mr. Termini states he is having significant right knee pain today that is worse than yesterday, especially in the posterior aspect. He is also having nausea that is not alleviated with Zofran, and is requesting Phenergan instead.   We discussed calling orthopedic surgery for re-evaluation. He is in agreement with plan. I spoke with surgery, and they do not believe there is any indication for surgery with gout diagnosis.  Objective:  Vital signs in last 24 hours: Vitals:   01/07/20 1314 01/07/20 1736 01/07/20 2108 01/08/20 0501  BP:  (!) 92/42 (!) 91/47 (!) 90/52  Pulse:  72 68 60  Resp: 16 18    Temp:  100.1 F (37.8 C) 99.8 F (37.7 C) 99.1 F (37.3 C)  TempSrc:  Oral Oral Oral  SpO2:  95% 95% 96%  Weight:        Physical Exam Constitutional: appears uncomfortable Head: atraumatic ENT: external ears normal Eyes: EOMI Cardiovascular: regular rate and rhythm, normal heart sounds Pulmonary: effort normal Abdominal: flat Musculoskeletal: R knee effusion, tenderness to palpation and ROM decreased but still moderate Skin: warm and dry, dressing on L thigh over chronic wound from prior dialysis access Neurological: alert, no focal deficit Psychiatric: normal mood and affect  Assessment/Plan: Arnel Wymer is a 55 y.o. male with hx of ESRD on MWF dialysis, HTN, CAD with mild 3 vessel disease, severe aortic stenosis, spina bifida, chronic opioid use, L knee septic arthritris presenting with dialysis access issue and L knee pain. Having decreased but adequate flow through catheter now after placing tPA in tube, but nephro still recommending exchange.  Principal Problem:   Clotted dialysis access [facility]) Active Problems:   ESRD (end stage renal disease) on dialysis ([place])   Hypothyroidism   Hemarthrosis, right knee  ESRD on MWF dialysis Impaired dialysis access Patient is well-known to the vascular surgery department and has had previous access to upper  extremities and in the left lower extremity, now has a right femoral tunneled dialysis catheter. Presented for poor flow during dialysis. Has had adequate flow here after treating the tPA. Vascular surgery has been following, and together with patient has decided to forego catheter exchange for now.  -vascular surgery consulted, appreciate recs -nephro consulted, appreciate recs     -HD MWF -Continue home PhosLo -Daily BMP -Avoid nephrotoxins  Acute gout of R knee He was admitted on 12/7 with pain and swelling of the same knee. MRI and Gram stain without evidence of infection or gout, attributed to hemarthrosis, he is on ASA 325mg  and Plavix 75mg  at home. Patient presented again this admission with increasing pain and swelling.  MRI this admission with large effusion with septations, no concerning bony changes and no clear infection. Arthrocentesis on 1/5 yielded 44,000 WBC predominantly PMNs and urate crystals. Gram stain with no organisms.  -f/u arthrocentesis cultures -increase Dilaudid to 1.5mg  for pain control -outpatient f/u with orthopedic surgery on 1/10  Fever Fever on evening of 1/4, Tmax since then of 100.1. No leukocytosis. Gout seems a likely source, but he is at risk for a an infection of his HD catheter.  -f/u blood culture -daily CBC  CAD with severe AS HLD Cath in 10/2019 with mild (25%) 3 vessel disease and severe AS.  -Continue home aspirin 325 mg and Plavix 75 mg -Continue home Crestor 10 mg  Hypothyroidism -Continue home Synthroid 137 mcg  Anxiety Insomnia -Continue home Lexapro 5 mg -Continue home trazodone 100 mg  Diet: renal diet IVF:  None VTE: Heparin Prior to Admission Living Arrangement: Home Anticipated Discharge Location: Home Barriers to Discharge: Dialysis access, infectious workup Dispo: Anticipated discharge in approximately 0-1 day(s).   [name], MD 01/08/2020, 5:45 AM Pager: [phone] After 5pm on weekdays and 1pm on weekends: On  Call pager [phone]

## 2020-01-09 DIAGNOSIS — M25561 Pain in right knee: Secondary | ICD-10-CM | POA: Diagnosis not present

## 2020-01-09 DIAGNOSIS — G4700 Insomnia, unspecified: Secondary | ICD-10-CM | POA: Diagnosis not present

## 2020-01-09 DIAGNOSIS — T8249XA Other complication of vascular dialysis catheter, initial encounter: Secondary | ICD-10-CM | POA: Diagnosis not present

## 2020-01-09 DIAGNOSIS — E039 Hypothyroidism, unspecified: Secondary | ICD-10-CM | POA: Diagnosis not present

## 2020-01-09 LAB — CBC
HCT: 30.4 % — ABNORMAL LOW (ref 39.0–52.0)
HCT: 33.6 % — ABNORMAL LOW (ref 39.0–52.0)
Hemoglobin: 9.4 g/dL — ABNORMAL LOW (ref 13.0–17.0)
Hemoglobin: 9.7 g/dL — ABNORMAL LOW (ref 13.0–17.0)
MCH: 29 pg (ref 26.0–34.0)
MCH: 27.9 pg (ref 26.0–34.0)
MCHC: 30.9 g/dL (ref 30.0–36.0)
MCHC: 28.9 g/dL — ABNORMAL LOW (ref 30.0–36.0)
MCV: 93.8 fL (ref 80.0–100.0)
MCV: 96.6 fL (ref 80.0–100.0)
Platelets: ADEQUATE 10*3/uL (ref 150–400)
Platelets: 183 10*3/uL (ref 150–400)
RBC: 3.24 MIL/uL — ABNORMAL LOW (ref 4.22–5.81)
RBC: 3.48 MIL/uL — ABNORMAL LOW (ref 4.22–5.81)
RDW: 16.4 % — ABNORMAL HIGH (ref 11.5–15.5)
RDW: 16.3 % — ABNORMAL HIGH (ref 11.5–15.5)
WBC: 6.5 10*3/uL (ref 4.0–10.5)
WBC: 6.5 10*3/uL (ref 4.0–10.5)
nRBC: 0 % (ref 0.0–0.2)
nRBC: 0.3 % — ABNORMAL HIGH (ref 0.0–0.2)

## 2020-01-09 LAB — RENAL FUNCTION PANEL
Albumin: 2.8 g/dL — ABNORMAL LOW (ref 3.5–5.0)
Albumin: 3 g/dL — ABNORMAL LOW (ref 3.5–5.0)
Anion gap: 16 — ABNORMAL HIGH (ref 5–15)
Anion gap: 16 — ABNORMAL HIGH (ref 5–15)
BUN: 60 mg/dL — ABNORMAL HIGH (ref 6–20)
BUN: 59 mg/dL — ABNORMAL HIGH (ref 6–20)
CO2: 22 mmol/L (ref 22–32)
CO2: 25 mmol/L (ref 22–32)
Calcium: 9 mg/dL (ref 8.9–10.3)
Calcium: 9.1 mg/dL (ref 8.9–10.3)
Chloride: 96 mmol/L — ABNORMAL LOW (ref 98–111)
Chloride: 92 mmol/L — ABNORMAL LOW (ref 98–111)
Creatinine, Ser: 8.93 mg/dL — ABNORMAL HIGH (ref 0.61–1.24)
Creatinine, Ser: 9.15 mg/dL — ABNORMAL HIGH (ref 0.61–1.24)
GFR, Estimated: 6 mL/min — ABNORMAL LOW (ref >60)
GFR, Estimated: 6 mL/min — ABNORMAL LOW (ref >60)
Glucose, Bld: 98 mg/dL (ref 70–99)
Glucose, Bld: 89 mg/dL (ref 70–99)
Phosphorus: 3.3 mg/dL (ref 2.5–4.6)
Phosphorus: 5.4 mg/dL — ABNORMAL HIGH (ref 2.5–4.6)
Potassium: 5.4 mmol/L — ABNORMAL HIGH (ref 3.5–5.1)
Potassium: 5 mmol/L (ref 3.5–5.1)
Sodium: 134 mmol/L — ABNORMAL LOW (ref 135–145)
Sodium: 133 mmol/L — ABNORMAL LOW (ref 135–145)

## 2020-01-09 LAB — CYTOLOGY - NON PAP

## 2020-01-09 MED ORDER — HEPARIN SODIUM (PORCINE) 1000 UNIT/ML DIALYSIS: 3700.0000 [IU] | Freq: Once | INTRAMUSCULAR | Status: DC

## 2020-01-09 MED ORDER — HEPARIN SODIUM (PORCINE) 1000 UNIT/ML DIALYSIS
4000.0000 [IU] | Freq: Once | INTRAMUSCULAR | Status: AC
  Administered 2020-01-09: 4000 [IU] via INTRAVENOUS_CENTRAL

## 2020-01-09 MED ORDER — PROMETHAZINE HCL 25 MG/ML IJ SOLN
INTRAMUSCULAR | Status: AC
  Filled 2020-01-09: qty 1

## 2020-01-09 MED ORDER — HEPARIN SODIUM (PORCINE) 1000 UNIT/ML IJ SOLN
INTRAMUSCULAR | Status: AC
  Filled 2020-01-09: qty 9

## 2020-01-09 NOTE — Progress Notes (Addendum)
RN notified MD about patient's high blood pressure of 131/109 and fever of 100.9 when taken at 1720, there is currently no PRN medications that can be given for this patient to treat either abnormal values and no new orders have been placed. RN will continue to monitor this patient.  1821 Update: new orders from MD have been placed and RN will implement them.

## 2020-01-09 NOTE — Progress Notes (Signed)
Seen in dialysis this am.  Right femoral tunneled dialysis catheter is running at 300 this morning.  Not perfect but enough to complete his dialysis sessions.  He does not want any attempt at exchange which is understandable.  Vascular will sign off.  Call with questions or concerns.  Marty Heck, MD [facility] [place] Office: [place]

## 2020-01-09 NOTE — Plan of Care (Signed)

## 2020-01-09 NOTE — Progress Notes (Signed)
Subjective:   [name] states he is still having right knee pain and swelling which is not much changed from yesterday. He has not tried bearing weight yet but is willing to try today. He c/o left buttock pain that feels like he is sitting on needles. He denies any buttock wound though. Mr. Failla endorses continued nausea but no vomiting.   Objective:  Vital signs in last 24 hours: Vitals:   01/08/20 0942 01/08/20 1700 01/08/20 2057 01/09/20 0510  BP: 111/62 (!) 95/52 (!) 117/55 (!) 119/49  Pulse: 67  69 (!) 56  Resp: 19 19    Temp: 99.3 F (37.4 C) 99.5 F (37.5 C) 99.2 F (37.3 C) (!) 97.5 F (36.4 C)  TempSrc: Oral Oral Oral Oral  SpO2: 98% 97% 98% 100%  Weight:        Physical Exam Constitutional: appears uncomfortable Head: atraumatic ENT: external ears normal Eyes: EOMI Cardiovascular: regular rate and rhythm, normal heart sounds Pulmonary: effort normal Abdominal: flat Musculoskeletal: R knee effusion and swelling, tenderness to palpation and ROM decreased, remains warm Skin: warm and dry, dressing on L thigh over chronic wound from prior dialysis access Neurological: alert, no focal deficit Psychiatric: normal mood and affect  Assessment/Plan: Montel Vanderhoof is a 55 y.o. male with hx of ESRD on MWF dialysis, HTN, CAD with mild 3 vessel disease, severe aortic stenosis, spina bifida, chronic opioid use, L knee septic arthritris presenting with dialysis access issue and L knee pain. Has decreased but adequate flow currently. Treating for gout of R knee.   Principal Problem:   Clotted dialysis access [facility]) Active Problems:   ESRD (end stage renal disease) on dialysis [facility])   Hypothyroidism   Acute gout of right knee  ESRD on MWF dialysis Impaired dialysis access Patient is well-known to the vascular surgery department and has had previous access to upper extremities and in the left lower extremity, now has a right femoral tunneled dialysis catheter.  Presented for poor flow during dialysis. Has had adequate flow here after treating the tPA. Vascular surgery has been following, and together with patient has decided to forego catheter exchange for now.  -vascular surgery signed off, appreciate assistance -nephro consulted, appreciate recs     -HD MWF -Continue home PhosLo -Daily BMP -Avoid nephrotoxins  Acute gout of R knee He was admitted on 12/7 with pain and swelling of the same knee. MRI and Gram stain without evidence of infection or gout, attributed to hemarthrosis, he is on ASA 325mg  and Plavix 75mg  at home. Patient presented again this admission with increasing pain and swelling.  MRI this admission with large effusion with septations, no concerning bony changes and no clear infection. Arthrocentesis on 1/5 yielded 44,000 WBC predominantly PMNs and urate crystals. Gram stain with no organisms.  -f/u arthrocentesis culture -continue prednisone 40mg  daily -increase Dilaudid to 1.5mg  for pain control -outpatient f/u with orthopedic surgery on 1/10  Fever Fever on evening of 1/4, no fevers since then. No leukocytosis. Gout seems a likely source, but he is at risk for a an infection of his HD catheter or septic arthritis.  -f/u blood culture -f/u arthrocentesis culture -daily CBC  CAD with severe AS HLD Cath in 10/2019 with mild (25%) 3 vessel disease and severe AS.  -Continue home aspirin 325 mg and Plavix 75 mg -Continue home Crestor 10 mg  Hypothyroidism -Continue home Synthroid 137 mcg  Anxiety Insomnia -Continue home Lexapro 5 mg -Continue home trazodone 100 mg -Continue home Ambien  Diet: renal diet IVF:  None VTE: Heparin Prior to Admission Living Arrangement: Home Anticipated Discharge Location: Home Barriers to Discharge: Dialysis access, infectious workup Dispo: Anticipated discharge in approximately 0-1 day(s).   [name], MD 01/09/2020, 5:47 AM Pager: [phone] After 5pm on weekdays and 1pm on  weekends: On Call pager [phone]

## 2020-01-09 NOTE — Procedures (Signed)
I was present at this dialysis session. I have reviewed the session itself and made appropriate changes.   UF goal 4L. 2K bath.  Qb 300 this AM.  R knee remains painful, on pred.   B and JOint fluid Cx NGTD. Not on ABX.    Tentative next HD on 01/12/19.  No further inpatient renal needs.   Filed Weights   01/07/20 1309 01/07/20 2108 01/09/20 0724  Weight: 118.3 kg 118.9 kg 123.3 kg    Recent Labs  Lab 01/09/20 0514  NA 134*  K 5.4*  CL 96*  CO2 22  GLUCOSE 98  BUN 60*  CREATININE 8.93*  CALCIUM 9.0  PHOS 3.3    Recent Labs  Lab 01/05/20 1259 01/06/20 0334 01/08/20 0242 01/09/20 0514 01/09/20 0803  WBC 5.5   < > 5.5 6.5 6.5  NEUTROABS 4.3  --   --   --   --   HGB 9.2*   < > 9.3* 9.4* 9.7*  HCT 31.9*   < > 31.8* 30.4* 33.6*  MCV 96.1   < > 96.4 93.8 96.6  PLT 143*   < > 177 PLATELET CLUMPS NOTED ON SMEAR, COUNT APPEARS ADEQUATE 183   < > = values in this interval not displayed.    Scheduled Meds: . aspirin  325 mg Oral Daily  . calcium acetate  2,668 mg Oral TID WC  . Chlorhexidine Gluconate Cloth  6 each Topical Daily  . clopidogrel  75 mg Oral Daily  . escitalopram  5 mg Oral Daily  . famotidine  20 mg Oral QODAY  . heparin  3,700 Units Dialysis Once in dialysis  . heparin  5,000 Units Subcutaneous Q8H  . heparin sodium (porcine)      . levothyroxine  137 mcg Oral QAC breakfast  . LORazepam  1 mg Oral Daily  . predniSONE  40 mg Oral Q breakfast  . rosuvastatin  10 mg Oral Daily  . traZODone  100 mg Oral QHS  . zolpidem  10 mg Oral QHS   Continuous Infusions: PRN Meds:.acetaminophen, diclofenac Sodium, HYDROmorphone (DILAUDID) injection, promethazine, senna, zolpidem   Pearson Grippe  MD 01/09/2020, 8:24 AM

## 2020-01-10 DIAGNOSIS — M25561 Pain in right knee: Secondary | ICD-10-CM | POA: Diagnosis not present

## 2020-01-10 DIAGNOSIS — T8249XA Other complication of vascular dialysis catheter, initial encounter: Secondary | ICD-10-CM | POA: Diagnosis not present

## 2020-01-10 DIAGNOSIS — E039 Hypothyroidism, unspecified: Secondary | ICD-10-CM | POA: Diagnosis not present

## 2020-01-10 DIAGNOSIS — G4700 Insomnia, unspecified: Secondary | ICD-10-CM | POA: Diagnosis not present

## 2020-01-10 LAB — CBC
HCT: 31.1 % — ABNORMAL LOW (ref 39.0–52.0)
Hemoglobin: 9.5 g/dL — ABNORMAL LOW (ref 13.0–17.0)
MCH: 29.1 pg (ref 26.0–34.0)
MCHC: 30.5 g/dL (ref 30.0–36.0)
MCV: 95.1 fL (ref 80.0–100.0)
Platelets: 183 10*3/uL (ref 150–400)
RBC: 3.27 MIL/uL — ABNORMAL LOW (ref 4.22–5.81)
RDW: 16.6 % — ABNORMAL HIGH (ref 11.5–15.5)
WBC: 7.8 10*3/uL (ref 4.0–10.5)
nRBC: 0.3 % — ABNORMAL HIGH (ref 0.0–0.2)

## 2020-01-10 LAB — RENAL FUNCTION PANEL
Albumin: 3 g/dL — ABNORMAL LOW (ref 3.5–5.0)
Anion gap: 16 — ABNORMAL HIGH (ref 5–15)
BUN: 46 mg/dL — ABNORMAL HIGH (ref 6–20)
CO2: 25 mmol/L (ref 22–32)
Calcium: 9 mg/dL (ref 8.9–10.3)
Chloride: 95 mmol/L — ABNORMAL LOW (ref 98–111)
Creatinine, Ser: 7.01 mg/dL — ABNORMAL HIGH (ref 0.61–1.24)
GFR, Estimated: 9 mL/min — ABNORMAL LOW (ref >60)
Glucose, Bld: 102 mg/dL — ABNORMAL HIGH (ref 70–99)
Phosphorus: 2.9 mg/dL (ref 2.5–4.6)
Potassium: 4.5 mmol/L (ref 3.5–5.1)
Sodium: 136 mmol/L (ref 135–145)

## 2020-01-10 LAB — SYNOVIAL CELL COUNT + DIFF, W/ CRYSTALS
Eosinophils-Synovial: 0 % (ref 0–1)
Lymphocytes-Synovial Fld: 0 % (ref 0–20)
Monocyte-Macrophage-Synovial Fluid: 8 % — ABNORMAL LOW (ref 50–90)
Neutrophil, Synovial: 92 % — ABNORMAL HIGH (ref 0–25)
WBC, Synovial: 7400 /mm3 — ABNORMAL HIGH (ref 0–200)

## 2020-01-10 LAB — BODY FLUID CULTURE: Culture: NO GROWTH

## 2020-01-10 MED ORDER — OXYCODONE HCL 5 MG PO TABS
5.0000 mg | ORAL_TABLET | Freq: Four times a day (QID) | ORAL | Status: DC
  Administered 2020-01-10 – 2020-01-11 (×5): 5 mg via ORAL
  Filled 2020-01-10 (×5): qty 1

## 2020-01-10 MED ORDER — OXYCODONE-ACETAMINOPHEN 5-325 MG PO TABS
1.0000 | ORAL_TABLET | Freq: Four times a day (QID) | ORAL | Status: DC
  Administered 2020-01-10 – 2020-01-11 (×5): 1 via ORAL
  Filled 2020-01-10 (×5): qty 1

## 2020-01-10 MED ORDER — PROMETHAZINE HCL 25 MG/ML IJ SOLN
12.5000 mg | Freq: Four times a day (QID) | INTRAMUSCULAR | Status: DC | PRN
  Administered 2020-01-10 – 2020-01-16 (×8): 12.5 mg via INTRAVENOUS
  Filled 2020-01-10 (×8): qty 1

## 2020-01-10 MED ORDER — FENTANYL 50 MCG/HR TD PT72
1.0000 | MEDICATED_PATCH | TRANSDERMAL | Status: DC
  Administered 2020-01-10: 1 via TRANSDERMAL
  Filled 2020-01-10: qty 1

## 2020-01-10 MED ORDER — OXYCODONE-ACETAMINOPHEN 10-325 MG PO TABS: 1.0000 | ORAL_TABLET | Freq: Four times a day (QID) | ORAL | Status: DC

## 2020-01-10 MED ORDER — HYDROMORPHONE HCL 1 MG/ML IJ SOLN
0.5000 mg | Freq: Once | INTRAMUSCULAR | Status: DC
  Filled 2020-01-10: qty 1

## 2020-01-10 NOTE — Plan of Care (Signed)

## 2020-01-10 NOTE — Procedures (Addendum)
   Knee Arthrocentesis without Injection Procedure Note  Diagnosis: right knee  Indications: Right knee effusion  Anesthesia: Lidocaine 1% without epinephrine  Procedure Details   Consent was obtained for the procedure. The joint was prepped with Betadine. 3 ml 1% lidocaine was injected with a 26 gauge needle to numb the area. Then a 22 gauge 1 and 1/2 inch needle was inserted into the inferior lateral aspect of the joint. 10 ml of bloody synovial fluid was removed and sent to the lab for analysis. The needle was removed and the area cleansed and dressed.  Complications:  None; patient tolerated the procedure well.   Harlow Ohms, DO PGY-2 IMTS 01/10/2020 2:20 pm

## 2020-01-10 NOTE — Progress Notes (Signed)
[facility] Progress Note   Subjective:   Seen in room. Continued knee pain/nausea. Frustration noted.  Completed dialysis yesterday. Catheter ran ok, some hypotension near the end.   Objective Vitals:   01/09/20 2100 01/10/20 0540 01/10/20 0557 01/10/20 0832  BP: 139/74 (!) 92/53 (!) 97/55 115/60  Pulse: 78 63 67 71  Resp: 18 16  14   Temp: 98.9 F (37.2 C) 99.3 F (37.4 C)  98.9 F (37.2 C)  TempSrc: Oral Oral  Oral  SpO2: 100% 98%  99%  Weight:       Physical Exam General: Well appearing man, NAD. Room air Heart: RRR; no murmur Lungs: CTAB Abdomen: soft, ostomy bag in place Extremities: Trace LE edema; swollen R knee joint Dialysis Access: TDC in R groin  Additional Objective Labs: Basic Metabolic Panel: Recent Labs  Lab 01/09/20 0514 01/09/20 0803 01/10/20 0212  NA 134* 133* 136  K 5.4* 5.0 4.5  CL 96* 92* 95*  CO2 22 25 25   GLUCOSE 98 89 102*  BUN 60* 59* 46*  CREATININE 8.93* 9.15* 7.01*  CALCIUM 9.0 9.1 9.0  PHOS 3.3 5.4* 2.9   Liver Function Tests: Recent Labs  Lab 01/05/20 1259 01/06/20 0334 01/09/20 0514 01/09/20 0803 01/10/20 0212  AST 11*  --   --   --   --   ALT 9  --   --   --   --   ALKPHOS 102  --   --   --   --   BILITOT 0.9  --   --   --   --   PROT 8.3*  --   --   --   --   ALBUMIN 3.3*   < > 2.8* 3.0* 3.0*   < > = values in this interval not displayed.   CBC: Recent Labs  Lab 01/05/20 1259 01/06/20 0334 01/07/20 0801 01/08/20 0242 01/09/20 0514 01/09/20 0803 01/10/20 0212  WBC 5.5   < > 4.7 5.5 6.5 6.5 7.8  NEUTROABS 4.3  --   --   --   --   --   --   HGB 9.2*   < > 9.7* 9.3* 9.4* 9.7* 9.5*  HCT 31.9*   < > 32.2* 31.8* 30.4* 33.6* 31.1*  MCV 96.1   < > 93.3 96.4 93.8 96.6 95.1  PLT 143*   < > 145* 177 PLATELET CLUMPS NOTED ON SMEAR, COUNT APPEARS ADEQUATE 183 183   < > = values in this interval not displayed.   Blood Culture    Component Value Date/Time   SDES BLOOD LEFT HAND 01/07/2020 1547    SPECREQUEST  01/07/2020 1547    BOTTLES DRAWN AEROBIC AND ANAEROBIC Blood Culture adequate volume   CULT  01/07/2020 1547    NO GROWTH < 24 HOURS Performed at [facility], [place] [address], [place],  [address]    REPTSTATUS PENDING 01/07/2020 1547   Medications:  . aspirin  325 mg Oral Daily  . calcium acetate  2,668 mg Oral TID WC  . Chlorhexidine Gluconate Cloth  6 each Topical Daily  . clopidogrel  75 mg Oral Daily  . escitalopram  5 mg Oral Daily  . famotidine  20 mg Oral QODAY  . heparin  5,000 Units Subcutaneous Q8H  .  HYDROmorphone (DILAUDID) injection  0.5 mg Intravenous Once  . levothyroxine  137 mcg Oral QAC breakfast  . LORazepam  1 mg Oral Daily  . predniSONE  40 mg Oral  Q breakfast  . rosuvastatin  10 mg Oral Daily  . traZODone  100 mg Oral QHS  . zolpidem  10 mg Oral QHS    Dialysis Orders: MWF at [facility] 4:45hr, 200 dialyzer, 350/800, EDW 117kg, 2K/2.25Ca, TDC, heparin 3700 bolus - Sensipar 30mg  PO q HD - Venofer 50mg  IV q week - Mircera 254mcg IV q 2 weeks (last 1/3)  Assessment/Plan: 1. Dysfunctional TDC: Have been able to use with intermittent cathflo. Pt has decided to hold off on [facility] exchange for now. No vascular surgery procedures anticipated at this time. 2. ESRD:Continue HD per MWF schedule - next HD 1/10. K+ 4.5. Monitor over weekend. Usually takes Kayexalate over weekends. Will order [facility] here if needed.  3. Hypertension/volume:BP low/stable, close to EDW. 4. Anemia:Hgb 9.5 - Recent ESA dose as outpatient  5. Metabolic bone disease:Ca/Phos ok - continue home binders. 6. Spina bifida 7. Chronic pain syndrome 8. R knee effusion: S/p joint aspiration 1/5. Per primary. 9. Low-grade fever: BCx and synovial fluid Cx on 1/5 negative to date.  Per primary.  Lynnda Child PA-C [facility] 01/10/2020,10:14 AM

## 2020-01-10 NOTE — Progress Notes (Signed)
Subjective:   Patient reports he is continuing to have severe pain. He states the smaller doses of dilaudid nor his home pain meds help alleviate his pain. He states he feels nauseated. He ambulated to the bathroom last night for a BM and states that has made his right knee pain worse.   Objective:  Vital signs in last 24 hours: Vitals:   01/09/20 1720 01/09/20 2100 01/10/20 0540 01/10/20 0557  BP: (!) 131/109 139/74 (!) 92/53 (!) 97/55  Pulse: 78 78 63 67  Resp: 18 18 16    Temp: (!) 100.9 F (38.3 C) 98.9 F (37.2 C) 99.3 F (37.4 C)   TempSrc: Oral Oral Oral   SpO2: 99% 100% 98%   Weight:       General- laying in bed uncomfortably CV- regular rate and rhythm, no murmurs Lungs- CTA bilaterally, normal work and effort Ext- right knee swelling, tender to palpation with bruising along the medial margins, no pitting edema Skin- warm and dry  Assessment/Plan:  Principal Problem:   Clotted dialysis access [facility]) Active Problems:   ESRD (end stage renal disease) on dialysis ([facility])   Hypothyroidism   Acute gout of right knee  Marquavion Venhuizen is a 55 y.o. male with hx of ESRD on MWF dialysis, HTN, CAD with mild 3 vessel disease, severe aortic stenosis, spina bifida, chronic opioid use, L knee septic arthritris presenting with dialysis access issue and L knee pain. Has decreased but adequate flow currently. Treating for gout of R knee.   Acute gout of R knee Patient continues to have severe right knee pain and swelling. No improvement thus far with prednisone 40 mg. He has only received two doses of this, missed dose yesterday. Pain is likely due to gout although he has had two fevers since hospital admission. Cultures have shown NGTD. Will repeat joint aspiration today and consider repeat cultures and abx if he continues to spike fevers.  -repeat right knee arthrocentesis  -f/u arthrocentesis culture -continue prednisone 40mg  daily -continue Dilaudid to 1.5mg  for pain control and  resume home percocet 5mg  today -outpatient f/u with orthopedic surgery on 1/10  Fever Febrile again on 1/7, tmax 100.9. No leukocytosis. Gout seems a likely source, but he is at risk for a an infection of his HD catheter. So far arthrocentesis and blood cultures have shown no growth to date. He also has two chronic skin wounds; left flank wound 2/2 nephrostomy tube which is weeping but does not look infected. And a second wound to his left thigh from previous catheter placements which looks healing. If he continues to spike fevers will repeat BC and consider starting antibiotics.  -f/u blood culture -f/u arthrocentesis culture -daily CBC  ESRD on MWF dialysis Impaired dialysis access Patient is well-known to the vascular surgery department and has had previous access to upper extremities and in the left lower extremity, now has a right femoral tunneled dialysis catheter. Presented for poor flow during dialysis. Has had adequate flow here after treating the tPA. Vascular surgery has been following, and together with patient has decided to forego catheter exchange for now.  -vascular surgery signed off, appreciate assistance -nephro consulted, appreciate recs     -HD MWF -Continue home PhosLo -Daily BMP -Avoid nephrotoxins    Prior to Admission Living Arrangement: Home Anticipated Discharge Location: Home Barriers to Discharge: right knee pain Dispo: Anticipated discharge pending clinical improvement.   Mike Craze, DO 01/10/2020, 6:33 AM Pager: [phone] After 5pm on weekdays and 1pm on  weekends: On Call pager [phone]

## 2020-01-11 ENCOUNTER — Inpatient Hospital Stay (HOSPITAL_COMMUNITY): Payer: Medicare Other

## 2020-01-11 DIAGNOSIS — M79609 Pain in unspecified limb: Secondary | ICD-10-CM | POA: Diagnosis not present

## 2020-01-11 DIAGNOSIS — M7989 Other specified soft tissue disorders: Secondary | ICD-10-CM

## 2020-01-11 LAB — RENAL FUNCTION PANEL
Albumin: 2.9 g/dL — ABNORMAL LOW (ref 3.5–5.0)
Anion gap: 13 (ref 5–15)
BUN: 77 mg/dL — ABNORMAL HIGH (ref 6–20)
CO2: 25 mmol/L (ref 22–32)
Calcium: 9.4 mg/dL (ref 8.9–10.3)
Chloride: 96 mmol/L — ABNORMAL LOW (ref 98–111)
Creatinine, Ser: 9.4 mg/dL — ABNORMAL HIGH (ref 0.61–1.24)
GFR, Estimated: 6 mL/min — ABNORMAL LOW (ref >60)
Glucose, Bld: 97 mg/dL (ref 70–99)
Phosphorus: 2.8 mg/dL (ref 2.5–4.6)
Potassium: 5.8 mmol/L — ABNORMAL HIGH (ref 3.5–5.1)
Sodium: 134 mmol/L — ABNORMAL LOW (ref 135–145)

## 2020-01-11 LAB — CBC
HCT: 30.7 % — ABNORMAL LOW (ref 39.0–52.0)
Hemoglobin: 9 g/dL — ABNORMAL LOW (ref 13.0–17.0)
MCH: 28.1 pg (ref 26.0–34.0)
MCHC: 29.3 g/dL — ABNORMAL LOW (ref 30.0–36.0)
MCV: 95.9 fL (ref 80.0–100.0)
Platelets: 181 10*3/uL (ref 150–400)
RBC: 3.2 MIL/uL — ABNORMAL LOW (ref 4.22–5.81)
RDW: 16.9 % — ABNORMAL HIGH (ref 11.5–15.5)
WBC: 6.9 10*3/uL (ref 4.0–10.5)
nRBC: 0 % (ref 0.0–0.2)

## 2020-01-11 MED ORDER — DIPHENHYDRAMINE HCL 50 MG/ML IJ SOLN: 12.5000 mg | Freq: Four times a day (QID) | INTRAMUSCULAR | Status: DC | PRN

## 2020-01-11 MED ORDER — SODIUM CHLORIDE 0.9% FLUSH: 9.0000 mL | INTRAVENOUS | Status: DC | PRN

## 2020-01-11 MED ORDER — HYDROMORPHONE HCL 1 MG/ML IJ SOLN
1.5000 mg | INTRAMUSCULAR | Status: DC | PRN
  Administered 2020-01-11 – 2020-01-12 (×4): 1.5 mg via INTRAVENOUS
  Filled 2020-01-11 (×4): qty 2

## 2020-01-11 MED ORDER — SODIUM ZIRCONIUM CYCLOSILICATE 10 G PO PACK
10.0000 g | PACK | Freq: Two times a day (BID) | ORAL | Status: AC
  Administered 2020-01-11 (×2): 10 g via ORAL
  Filled 2020-01-11 (×2): qty 1

## 2020-01-11 MED ORDER — NALOXONE HCL 0.4 MG/ML IJ SOLN: 0.4000 mg | INTRAMUSCULAR | Status: DC | PRN

## 2020-01-11 MED ORDER — HYDROMORPHONE 1 MG/ML IV SOLN
INTRAVENOUS | Status: DC
  Administered 2020-01-11: 30 mg via INTRAVENOUS
  Filled 2020-01-11: qty 30

## 2020-01-11 MED ORDER — LIDOCAINE 5 % EX PTCH
1.0000 | MEDICATED_PATCH | CUTANEOUS | Status: DC
  Administered 2020-01-11 – 2020-01-16 (×6): 1 via TRANSDERMAL
  Filled 2020-01-11 (×6): qty 1

## 2020-01-11 MED ORDER — DIPHENHYDRAMINE HCL 12.5 MG/5ML PO ELIX: 12.5000 mg | ORAL_SOLUTION | Freq: Four times a day (QID) | ORAL | Status: DC | PRN

## 2020-01-11 NOTE — Progress Notes (Signed)
[facility] Progress Note   Subjective:   Seen in room. Did sit up in chair yesterday but ongoing pain in R knee, now R foot as well. Nauseated can't eat.   Objective Vitals:   01/10/20 0832 01/10/20 1809 01/10/20 2038 01/11/20 0645  BP: 115/60 140/73 (!) 104/46 124/77  Pulse: 71 81 75 61  Resp: 14 18 17 18   Temp: 98.9 F (37.2 C) 99 F (37.2 C) 99 F (37.2 C) 98.3 F (36.8 C)  TempSrc: Oral Oral Oral Oral  SpO2: 99% 100% 99% 99%  Weight:       Physical Exam General: Well appearing man, NAD. Room air Heart: RRR; no murmur Lungs: CTAB Abdomen: soft, ostomy bag in place Extremities: Trace LE edema; swollen R knee joint Dialysis Access: TDC in R groin  Additional Objective Labs: Basic Metabolic Panel: Recent Labs  Lab 01/09/20 0803 01/10/20 0212 01/11/20 0407  NA 133* 136 134*  K 5.0 4.5 5.8*  CL 92* 95* 96*  CO2 25 25 25   GLUCOSE 89 102* 97  BUN 59* 46* 77*  CREATININE 9.15* 7.01* 9.40*  CALCIUM 9.1 9.0 9.4  PHOS 5.4* 2.9 2.8   Liver Function Tests: Recent Labs  Lab 01/05/20 1259 01/06/20 0334 01/09/20 0803 01/10/20 0212 01/11/20 0407  AST 11*  --   --   --   --   ALT 9  --   --   --   --   ALKPHOS 102  --   --   --   --   BILITOT 0.9  --   --   --   --   PROT 8.3*  --   --   --   --   ALBUMIN 3.3*   < > 3.0* 3.0* 2.9*   < > = values in this interval not displayed.   CBC: Recent Labs  Lab 01/05/20 1259 01/06/20 0334 01/08/20 0242 01/09/20 0514 01/09/20 0803 01/10/20 0212 01/11/20 0407  WBC 5.5   < > 5.5 6.5 6.5 7.8 6.9  NEUTROABS 4.3  --   --   --   --   --   --   HGB 9.2*   < > 9.3* 9.4* 9.7* 9.5* 9.0*  HCT 31.9*   < > 31.8* 30.4* 33.6* 31.1* 30.7*  MCV 96.1   < > 96.4 93.8 96.6 95.1 95.9  PLT 143*   < > 177 PLATELET CLUMPS NOTED ON SMEAR, COUNT APPEARS ADEQUATE 183 183 181   < > = values in this interval not displayed.   Blood Culture    Component Value Date/Time   SDES FLUID RIGHT KNEE 01/10/2020 2050   Haena 01/10/2020 2050   CULT PENDING 01/10/2020 2050   REPTSTATUS PENDING 01/10/2020 2050   Medications:  . aspirin  325 mg Oral Daily  . calcium acetate  2,668 mg Oral TID WC  . Chlorhexidine Gluconate Cloth  6 each Topical Daily  . clopidogrel  75 mg Oral Daily  . escitalopram  5 mg Oral Daily  . famotidine  20 mg Oral QODAY  . heparin  5,000 Units Subcutaneous Q8H  .  HYDROmorphone (DILAUDID) injection  0.5 mg Intravenous Once  . levothyroxine  137 mcg Oral QAC breakfast  . lidocaine  1 patch Transdermal Q24H  . LORazepam  1 mg Oral Daily  . oxyCODONE-acetaminophen  1 tablet Oral Q6H   And  . oxyCODONE  5 mg Oral Q6H  . predniSONE  40 mg Oral Q breakfast  .  rosuvastatin  10 mg Oral Daily  . sodium zirconium cyclosilicate  10 g Oral BID  . traZODone  100 mg Oral QHS  . zolpidem  10 mg Oral QHS    Dialysis Orders: MWF at [facility] 4:45hr, 200 dialyzer, 350/800, EDW 117kg, 2K/2.25Ca, TDC, heparin 3700 bolus - Sensipar 30mg  PO q HD - Venofer 50mg  IV q week - Mircera 270mcg IV q 2 weeks (last 1/3)  Assessment/Plan: 1. Dysfunctional TDC: Have been able to use with intermittent cathflo. Pt has decided to hold off on [facility] exchange for now. No vascular surgery procedures anticipated at this time. 2. ESRD:Continue HD per MWF schedule - next HD 1/10. Monitor over weekend. Usually takes Kayexalate over weekends at home. K+ 5.8  Lokelma ordered.  3. Hypertension/volume:BP low/stable, close to EDW. 4. Anemia:Hgb 9s - Recent ESA dose as outpatient  5. Metabolic bone disease:Ca/Phos ok - continue home binders. 6. Spina bifida 7. Chronic pain syndrome 8. R knee effusion: S/p joint aspiration 1/5. Repeat on 1/8 Cx pending.  Pain control remains an issue.  9. Low-grade fever: BCx and synovial fluid Cx on 1/5 negative to date.  Per primary.  Lynnda Child PA-C [facility] 01/11/2020,9:12 AM

## 2020-01-11 NOTE — Progress Notes (Signed)
RN states that the PIV was tugged on and is having difficulty flushing.  Encouraged RN to have next shift assess PIV and do a dressing change to IV site.  RN states it may still be working.  Will reconsult IV team if interventions unsuccessful.

## 2020-01-11 NOTE — Progress Notes (Signed)
Right lower extremity venous duplex has been completed. Preliminary results can be found in CV Proc through chart review.   01/11/20 3:36 PM [name] RVT

## 2020-01-11 NOTE — Progress Notes (Signed)
Subjective:   Patient is tearful this morning and complains of severe pain in his right ankle and dorsal aspect of foot which began 2 days ago.  States his right knee is still painful but somewhat improved.  No fevers overnight.  Denies chest pain or palpitations.  Objective:  Vital signs in last 24 hours: Vitals:   01/10/20 0557 01/10/20 0832 01/10/20 1809 01/10/20 2038  BP: (!) 97/55 115/60 140/73 (!) 104/46  Pulse: 67 71 81 75  Resp:  14 18 17   Temp:  98.9 F (37.2 C) 99 F (37.2 C) 99 F (37.2 C)  TempSrc:  Oral Oral Oral  SpO2:  99% 100% 99%  Weight:        Physical Exam Constitutional: appears uncomfortable, tearful Head: atraumatic ENT: external ears normal Eyes: EOMI Cardiovascular: regular rate and rhythm, normal heart sounds Pulmonary: effort normal Abdominal: flat Musculoskeletal: R knee effusion and swelling, tenderness to palpation and ROM which seems to be improving, right ankle is edematous and tender, exquisite pain with range of motion, right calf is also tender and muscle is tight Skin: warm and dry, dressing on L thigh over chronic wound from prior dialysis access, also has dressing over site of prior nephrostomy tube on left flank which is draining mild amount of serosanguineous fluid Neurological: alert, no focal deficit Psychiatric: Tearful and anxious  Assessment/Plan:  Principal Problem:   Clotted dialysis access [facility]) Active Problems:   ESRD (end stage renal disease) on dialysis ([place])   Hypothyroidism   Acute gout of right knee  [name] is a 55 y.o. male with hx of ESRD on MWF dialysis, HTN, CAD, severe aortic stenosis, spina bifida, chronic opioid use, L knee septic arthritris presenting with dialysis access issue and L knee pain. Has decreased but adequate flow currently. Treating for gout of R knee which seems improving, also has new pain of right ankle and tenderness to calf.  Acute gout of R knee Patient continues to have severe  right knee pain and swelling. No improvement thus far with prednisone 40 mg daily, though he missed a dose while he was in dialysis. Pain is likely due to gout although he has had two fevers since hospital admission. Cultures have shown NGTD.  Repeat joint aspiration again demonstrated a uric acid crystals and 7000 white blood cells. -f/u arthrocentesis cultures -continue prednisone 40mg  daily  -continue Dilaudid 1.5mg  for pain control and resume home percocet 5mg  today -outpatient f/u with orthopedic surgery on 1/10 is scheduled, likely will not discharge in time  Right foot pain Right calf tenderness Reports acute onset pain in the right foot starting 2 days ago, this is his chief complaint at this time.  Another flare of gout is possible, but seems somewhat unlikely with current use of prednisone.  Also considering DVT with his calf tenderness. -Follow-up right foot x-ray -Follow-up right lower extremity Doppler -Pain control as above  Fever Febrile again on 1/7, tmax 100.9. No leukocytosis. Gout seems a likely source, but he is at risk for a an infection of his HD catheter. So far arthrocentesis and blood cultures have shown no growth to date. He also has two chronic skin wounds; left flank wound 2/2 nephrostomy tube which is weeping but does not look infected. And a second wound to his left thigh from previous catheter placements which looks healing. If he continues to spike fevers will repeat BC and consider starting antibiotics. -Wound care consult -Follow-up blood and arthrocentesis cultures, no growth to date -  daily CBC  ESRD on MWF dialysis Impaired dialysis access Hyperkalemia Patient is well-known to the vascular surgery department and has had previous access to upper extremities and in the left lower extremity, now has a right femoral tunneled dialysis catheter. Presented for poor flow during dialysis. Has had adequate flow here after treating the tPA. Vascular surgery has been  following, and together with patient has decided to forego catheter exchange for now.  K of 5.8 today, asymptomatic and EKG without concerning changes.  T waves appear tall, but the chest leads are less than 10 mm.  Typically uses Kayexelate over the weekend. -vascular surgery signed off, appreciate assistance -nephro consulted, appreciate recs     -HD MWF -Continue home PhosLo -Lokelma today -Daily BMP -Avoid nephrotoxins  Prior to Admission Living Arrangement: Home Anticipated Discharge Location: Home Barriers to Discharge: Pain control, rule out infection Dispo: Anticipated discharge pending clinical improvement.   [name], MD 01/11/2020, 5:59 AM Pager: [phone] After 5pm on weekdays and 1pm on weekends: On Call pager [phone]

## 2020-01-12 ENCOUNTER — Telehealth: Payer: Self-pay | Admitting: Orthopedic Surgery

## 2020-01-12 ENCOUNTER — Ambulatory Visit: Payer: Medicare Other | Admitting: Orthopedic Surgery

## 2020-01-12 DIAGNOSIS — M25561 Pain in right knee: Secondary | ICD-10-CM | POA: Diagnosis not present

## 2020-01-12 DIAGNOSIS — T8249XA Other complication of vascular dialysis catheter, initial encounter: Secondary | ICD-10-CM | POA: Diagnosis not present

## 2020-01-12 DIAGNOSIS — E039 Hypothyroidism, unspecified: Secondary | ICD-10-CM | POA: Diagnosis not present

## 2020-01-12 DIAGNOSIS — G4700 Insomnia, unspecified: Secondary | ICD-10-CM | POA: Diagnosis not present

## 2020-01-12 LAB — BASIC METABOLIC PANEL
Anion gap: 14 (ref 5–15)
BUN: 104 mg/dL — ABNORMAL HIGH (ref 6–20)
CO2: 22 mmol/L (ref 22–32)
Calcium: 9.4 mg/dL (ref 8.9–10.3)
Chloride: 96 mmol/L — ABNORMAL LOW (ref 98–111)
Creatinine, Ser: 11.41 mg/dL — ABNORMAL HIGH (ref 0.61–1.24)
GFR, Estimated: 5 mL/min — ABNORMAL LOW (ref >60)
Glucose, Bld: 97 mg/dL (ref 70–99)
Potassium: 6.3 mmol/L (ref 3.5–5.1)
Sodium: 132 mmol/L — ABNORMAL LOW (ref 135–145)

## 2020-01-12 LAB — CBC
HCT: 30.4 % — ABNORMAL LOW (ref 39.0–52.0)
Hemoglobin: 8.9 g/dL — ABNORMAL LOW (ref 13.0–17.0)
MCH: 28.1 pg (ref 26.0–34.0)
MCHC: 29.3 g/dL — ABNORMAL LOW (ref 30.0–36.0)
MCV: 95.9 fL (ref 80.0–100.0)
Platelets: 191 10*3/uL (ref 150–400)
RBC: 3.17 MIL/uL — ABNORMAL LOW (ref 4.22–5.81)
RDW: 17 % — ABNORMAL HIGH (ref 11.5–15.5)
WBC: 7.5 10*3/uL (ref 4.0–10.5)
nRBC: 0 % (ref 0.0–0.2)

## 2020-01-12 LAB — CULTURE, BLOOD (ROUTINE X 2)
Culture: NO GROWTH
Culture: NO GROWTH
Special Requests: ADEQUATE

## 2020-01-12 LAB — ANAEROBIC CULTURE

## 2020-01-12 MED ORDER — OXYCODONE-ACETAMINOPHEN 10-325 MG PO TABS: 1.0000 | ORAL_TABLET | Freq: Four times a day (QID) | ORAL | Status: DC

## 2020-01-12 MED ORDER — HEPARIN SODIUM (PORCINE) 1000 UNIT/ML IJ SOLN
INTRAMUSCULAR | Status: AC
  Filled 2020-01-12: qty 4

## 2020-01-12 MED ORDER — ALTEPLASE 2 MG IJ SOLR
6.0000 mg | Freq: Once | INTRAMUSCULAR | Status: AC
  Filled 2020-01-12: qty 6

## 2020-01-12 MED ORDER — PREDNISONE 20 MG PO TABS
40.0000 mg | ORAL_TABLET | Freq: Every day | ORAL | Status: DC
  Administered 2020-01-13 – 2020-01-14 (×2): 40 mg via ORAL
  Filled 2020-01-12 (×3): qty 2

## 2020-01-12 MED ORDER — OXYCODONE HCL 5 MG PO TABS
5.0000 mg | ORAL_TABLET | Freq: Four times a day (QID) | ORAL | Status: DC
  Administered 2020-01-12 – 2020-01-13 (×3): 5 mg via ORAL
  Filled 2020-01-12 (×3): qty 1

## 2020-01-12 MED ORDER — ALTEPLASE 2 MG IJ SOLR
6.0000 mg | Freq: Once | INTRAMUSCULAR | Status: AC
  Administered 2020-01-12: 6 mg

## 2020-01-12 MED ORDER — ALTEPLASE 2 MG IJ SOLR
INTRAMUSCULAR | Status: AC
  Administered 2020-01-12: 2 mg
  Filled 2020-01-12: qty 6

## 2020-01-12 MED ORDER — HYDROMORPHONE HCL 1 MG/ML IJ SOLN
2.0000 mg | INTRAMUSCULAR | Status: DC | PRN
  Administered 2020-01-12 – 2020-01-15 (×13): 2 mg via INTRAVENOUS
  Filled 2020-01-12 (×15): qty 2

## 2020-01-12 MED ORDER — NALOXONE HCL 0.4 MG/ML IJ SOLN: 0.4000 mg | INTRAMUSCULAR | Status: DC | PRN

## 2020-01-12 MED ORDER — SODIUM ZIRCONIUM CYCLOSILICATE 10 G PO PACK
10.0000 g | PACK | ORAL | Status: AC
  Administered 2020-01-12: 10 g via ORAL
  Filled 2020-01-12: qty 1

## 2020-01-12 MED ORDER — PREDNISONE 20 MG PO TABS
40.0000 mg | ORAL_TABLET | Freq: Once | ORAL | Status: AC
  Administered 2020-01-12: 40 mg via ORAL
  Filled 2020-01-12: qty 2

## 2020-01-12 MED ORDER — PROMETHAZINE HCL 25 MG/ML IJ SOLN
INTRAMUSCULAR | Status: AC
  Administered 2020-01-12: 25 mg
  Filled 2020-01-12: qty 1

## 2020-01-12 MED ORDER — OXYCODONE-ACETAMINOPHEN 5-325 MG PO TABS
1.0000 | ORAL_TABLET | Freq: Four times a day (QID) | ORAL | Status: DC
  Administered 2020-01-12 – 2020-01-13 (×3): 1 via ORAL
  Filled 2020-01-12 (×3): qty 1

## 2020-01-12 NOTE — Progress Notes (Signed)
Subjective:   Patient complains of continued pain in his R ankle. Attempted PCA last night but he did not like all the wires and other apparatus. States his knee pain is improving. He does continue to make a small amount of urine, needs to empty his ostomy bag roughly once every 2 weeks.  Objective:  Vital signs in last 24 hours: Vitals:   01/11/20 1850 01/11/20 1855 01/11/20 2030 01/12/20 0555  BP: (!) 125/35 (!) 138/46 (!) 147/65 118/68  Pulse: 68  67 (!) 55  Resp: 14  18 18   Temp: 99.4 F (37.4 C)  99.5 F (37.5 C) (!) 97.5 F (36.4 C)  TempSrc: Oral  Oral Oral  SpO2: 98%  100% 98%  Weight:        Physical Exam Constitutional: appears uncomfortable, tearful Head: atraumatic ENT: external ears normal Eyes: EOMI Cardiovascular: regular rate and rhythm, normal heart sounds Pulmonary: effort normal Abdominal: flat, ileostomy bag in place Musculoskeletal: R knee effusion and swelling which is improved, no longer warm compared to other side, R ankle and dorsal aspect of foot with severe tenderness with ROM Skin: warm and dry, dressing on L thigh over chronic wound from prior dialysis access, also has dressing over site of prior nephrostomy tube on left flank which is draining mild amount of serosanguineous fluid Neurological: alert, no focal deficit Psychiatric: Tearful and anxious  Assessment/Plan:  Principal Problem:   Clotted dialysis access [facility]) Active Problems:   ESRD (end stage renal disease) on dialysis ([place])   Hypothyroidism   Acute gout of right knee  [name] is a 55 y.o. male with hx of ESRD on MWF dialysis, HTN, CAD, severe aortic stenosis, spina bifida, chronic opioid use, L knee septic arthritris presenting with dialysis access issue and L knee pain. Has decreased but adequate flow currently. Treating for gout of R knee which seems improving, also developed likely gout of R foot.  Acute gout of R knee and foot Patient continues to have severe  right knee pain and swelling. First arthrocentesis with white count 44,000 and urate crystals. Started prednisone 40 mg daily but has not had great improvement, though he missed a dose while he was in dialysis. Pain is likely due to gout although he has had two fevers since hospital admission. Cultures have shown NGTD.  Repeat joint aspiration again demonstrated a uric acid crystals and 7000 white blood cells. Knee pain is improving on exam. Also developed pain in R foot which is swollen and warm with severe pain on ROM. Exam is consistent with gout. Xray negative.  -f/u arthrocentesis cultures -continue prednisone 40mg  daily -increase Dilaudid to 2mg  q6hr for pain control  -outpatient f/u with orthopedic surgery on 1/10 is scheduled, will not discharge in time. Patient is calling to reschedule  Fever Febrile again on 1/7, tmax 100.9. No leukocytosis. Gout seems a likely source, but he is at risk for a an infection of his HD catheter. So far arthrocentesis and blood cultures have shown no growth to date. He also has two chronic skin wounds; left flank wound 2/2 nephrostomy tube which is weeping but does not look infected. And a second wound to his left thigh from previous catheter placements which looks healing. If he continues to spike fevers will repeat BC and consider starting antibiotics. -Wound care consult -Follow-up blood and arthrocentesis cultures, no growth to date -daily CBC  ESRD on MWF dialysis Impaired dialysis access Hyperkalemia Patient is well-known to the vascular surgery department and has  had previous access to upper extremities and in the left lower extremity, now has a right femoral tunneled dialysis catheter. Presented for poor flow during dialysis. Has had adequate flow here after treating the tPA. Vascular surgery has been following, and together with patient has decided to forego catheter exchange for now.  -vascular surgery signed off, appreciate assistance -nephro  consulted, appreciate recs     -HD MWF -Continue home PhosLo -Daily BMP -K up to 6.3 today. Repeat EKG. Dialysis today. -Avoid nephrotoxins  Prior to Admission Living Arrangement: Home Anticipated Discharge Location: Home Barriers to Discharge: Pain control, rule out infection Dispo: Anticipated discharge pending clinical improvement.   [name], MD 01/12/2020, 6:32 AM Pager: [phone] After 5pm on weekdays and 1pm on weekends: On Call pager [phone]

## 2020-01-12 NOTE — Progress Notes (Signed)
Critical potasium of 6.3 called on the patient, MD paged to notify.

## 2020-01-12 NOTE — Progress Notes (Signed)
Received patient from floor. Upon checking patient catheter access for dialysis both lumen no outflow. Dr Jonnie Finner aware, cathflo was instilled on both lumen and patent sent back to his room.

## 2020-01-12 NOTE — Telephone Encounter (Signed)
Pt called and he is at the hospital for his right knee and will not be able to make it to his appt today. He would like to know if Dr.Dean could come see him. Please give him a call [phone]

## 2020-01-12 NOTE — Consult Note (Addendum)
[place] Nurse Consult Note: Patient receiving care in [facility] [phone] Reason for Consult: Left flank and LUE wounds Wound type: Previous nephrostomy tube sites, leaking scant amount of purulent pink drainage. Measurement: deferred Wound bed: healthy skin with no erythema  Drainage (amount, consistency, odor) see above Periwound: Intact Dressing procedure/placement/frequency: Clean the LU flank area with soap and water, rinse and pat dry. Place folded 4 x 4 in the leaking sites, cover with an ABD pad and secure with Medipore tape. Change daily This patient has a urostomy pouch in which he states puts out hardly anything. The pouch he currently has is not in [place] and he declines the use of our Hol[place] pouch. Patient states he only changes the pouch about once a month and that it does not need to be changed at this time.   Monitor the wound area(s) for worsening of condition such as: Signs/symptoms of infection, increase in size, development of or worsening of odor, development of pain, or increased pain at the affected locations.   Notify the medical team if any of these develop.  Thank you for the consult. [place] nurse will not follow at this time.   Please re-consult the [place] team if needed.  Cathlean Marseilles Tamala Julian, MSN, RN, [place], [place]le Pearl, [facility] Wound Treatment Associate Pager [phone]

## 2020-01-12 NOTE — Progress Notes (Signed)
[facility] Progress Note   Subjective:   Patient seen and examined at bedside during dialysis. Issues with TDC this AM, now improved following cath flow dwell.  BFR 300.  Continues to have pain in R knee and foot.  Denies SOB, CP and palpitations.    Objective Vitals:   01/12/20 0555 01/12/20 0952 01/12/20 1025 01/12/20 1030  BP: 118/68 (!) 123/59    Pulse: (!) 55 62  63  Resp: 18 18    Temp: (!) 97.5 F (36.4 C) (!) 100.4 F (38 C)  98.4 F (36.9 C)  TempSrc: Oral Oral  Oral  SpO2: 98% 99%  100%  Weight:   123 kg    Physical Exam General:chroncially ill appearing male in NAD Heart:RRR, +3/1 systolic murmur Lungs:CTAB, nml WOB Abdomen:soft, NTND Extremities:trace LE edema, +swelling/tenderness R knee Dialysis Access: R thigh TDC accessed   Filed Weights   01/09/20 0724 01/09/20 1105 01/12/20 1025  Weight: 123.3 kg 120.4 kg 123 kg    Intake/Output Summary (Last 24 hours) at 01/12/2020 1159 Last data filed at 01/11/2020 1747 Gross per 24 hour  Intake 510 ml  Output --  Net 510 ml    Additional Objective Labs: Basic Metabolic Panel: Recent Labs  Lab 01/09/20 0803 01/10/20 0212 01/11/20 0407 01/12/20 0322  NA 133* 136 134* 132*  K 5.0 4.5 5.8* 6.3*  CL 92* 95* 96* 96*  CO2 25 25 25 22   GLUCOSE 89 102* 97 97  BUN 59* 46* 77* 104*  CREATININE 9.15* 7.01* 9.40* 11.41*  CALCIUM 9.1 9.0 9.4 9.4  PHOS 5.4* 2.9 2.8  --    Liver Function Tests: Recent Labs  Lab 01/05/20 1259 01/06/20 0334 01/09/20 0803 01/10/20 0212 01/11/20 0407  AST 11*  --   --   --   --   ALT 9  --   --   --   --   ALKPHOS 102  --   --   --   --   BILITOT 0.9  --   --   --   --   PROT 8.3*  --   --   --   --   ALBUMIN 3.3*   < > 3.0* 3.0* 2.9*   < > = values in this interval not displayed.   CBC: Recent Labs  Lab 01/05/20 1259 01/06/20 0334 01/09/20 0514 01/09/20 0803 01/10/20 0212 01/11/20 0407 01/12/20 0322  WBC 5.5   < > 6.5 6.5 7.8 6.9 7.5  NEUTROABS 4.3   --   --   --   --   --   --   HGB 9.2*   < > 9.4* 9.7* 9.5* 9.0* 8.9*  HCT 31.9*   < > 30.4* 33.6* 31.1* 30.7* 30.4*  MCV 96.1   < > 93.8 96.6 95.1 95.9 95.9  PLT 143*   < > PLATELET CLUMPS NOTED ON SMEAR, COUNT APPEARS ADEQUATE 183 183 181 191   < > = values in this interval not displayed.   Blood Culture    Component Value Date/Time   SDES FLUID RIGHT KNEE 01/10/2020 2050   SPECREQUEST SYRINGE 01/10/2020 2050   CULT  01/10/2020 2050    NO GROWTH 2 DAYS Performed at [facility], [place] [address], [place], [place] [address]    REPTSTATUS PENDING 01/10/2020 2050   Studies/Results: DG Foot 2 Views Right  Result Date: 01/11/2020 CLINICAL DATA:  Right foot pain. EXAM: RIGHT FOOT - 2 VIEW COMPARISON:  None. FINDINGS:  There is nonspecific soft tissue swelling about the foot and ankle. There is an old healed fracture of the fifth metatarsal. No definite acute displaced fracture or dislocation on this study. There is a large osteophyte arising from the dorsal anterior talus. There is a probable old injury involving the dorsal navicular. There are extensive degenerative changes of the midfoot. There is an ankle joint effusion. No radiographic evidence for osteomyelitis. Vascular calcifications are noted. IMPRESSION: 1. No acute displaced fracture or dislocation. 2. There is nonspecific soft tissue swelling about the foot. 3. 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 Electronically Signed   By: Constance Holster M.D.   On: 01/11/2020 19:43   VAS [place] LOWER EXTREMITY VENOUS (DVT)  Result Date: 01/11/2020  Lower Venous DVT Study Indications: Pain, and Swelling.  Risk Factors: Surgery 12/21/2017 - RIGHT FEMORAL DIALYSIS CATHETER EXCHANGE. Limitations: Body habitus, poor ultrasound/tissue interface, bandages, line and patient positioning, patient pain tolerance. Comparison Study: 12/15/2019 - Negative for DVT. Performing Technologist: Oliver Hum RVT   Examination Guidelines: A complete evaluation includes B-mode imaging, spectral Doppler, color Doppler, and power Doppler as needed of all accessible portions of each vessel. Bilateral testing is considered an integral part of a complete examination. Limited examinations for reoccurring indications may be performed as noted. The reflux portion of the exam is performed with the patient in reverse Trendelenburg.  +---------+---------------+---------+-----------+----------+-------------------+ RIGHT    CompressibilityPhasicitySpontaneityPropertiesThrombus Aging      +---------+---------------+---------+-----------+----------+-------------------+ CFV      Full           Yes      Yes                                      +---------+---------------+---------+-----------+----------+-------------------+ SFJ      Full                                                             +---------+---------------+---------+-----------+----------+-------------------+ FV Prox  Full           Yes      Yes                                      +---------+---------------+---------+-----------+----------+-------------------+ FV Mid                                                HD access           +---------+---------------+---------+-----------+----------+-------------------+ FV Distal               Yes      Yes                                      +---------+---------------+---------+-----------+----------+-------------------+ PFV  Not well visualized +---------+---------------+---------+-----------+----------+-------------------+ POP      Full           Yes      Yes                                      +---------+---------------+---------+-----------+----------+-------------------+ PTV      Full                                                              +---------+---------------+---------+-----------+----------+-------------------+ PERO                                                  Patency shown with                                                        color doppler       +---------+---------------+---------+-----------+----------+-------------------+   +----+---------------+---------+-----------+----------+--------------+ LEFTCompressibilityPhasicitySpontaneityPropertiesThrombus Aging +----+---------------+---------+-----------+----------+--------------+ CFV Full           Yes      Yes                                 +----+---------------+---------+-----------+----------+--------------+     Summary: RIGHT: - There is no evidence of deep vein thrombosis in the lower extremity. However, portions of this examination were limited- see technologist comments above.  - No cystic structure found in the popliteal fossa.  LEFT: - No evidence of common femoral vein obstruction.  *See table(s) above for measurements and observations. Electronically signed by Monica Martinez MD on 01/11/2020 at 4:18:06 PM.    Final     Medications:  . alteplase  6 mg Intracatheter Once  . alteplase  6 mg Intracatheter Once  . aspirin  325 mg Oral Daily  . calcium acetate  2,668 mg Oral TID WC  . Chlorhexidine Gluconate Cloth  6 each Topical Daily  . clopidogrel  75 mg Oral Daily  . escitalopram  5 mg Oral Daily  . famotidine  20 mg Oral QODAY  . heparin  5,000 Units Subcutaneous Q8H  . heparin sodium (porcine)      . levothyroxine  137 mcg Oral QAC breakfast  . lidocaine  1 patch Transdermal Q24H  . predniSONE  40 mg Oral Q breakfast  . rosuvastatin  10 mg Oral Daily  . sodium zirconium cyclosilicate  10 g Oral To Hemo  . traZODone  100 mg Oral QHS  . zolpidem  10 mg Oral QHS    Dialysis Orders: MWF at [facility] 4:45hr, 200 dialyzer, 350/800, EDW 117kg, 2K/2.25Ca, TDC, heparin 3700 bolus - Sensipar 30mg  PO q HD - Venofer 50mg  IV q  week - Mircera 249mcg IV q 2 weeks (last 1/3)  Assessment/Plan: 1. Dysfunctional TDC: Continues to be an issue.  Cath flow dwell again this AM, has required intermittently.  Plan for cath flow  dwell post HD. Pt has decided to hold off on [facility] exchange for now. No vascular surgery procedures anticipated at this time.   2. ESRD:Continue HD per MWF schedule.  HD today per regular schedule. Monitor over weekend. 3. Hyperkalemia - Usually takes kayexelate over weekends as OP.  Lokelma ordered.  K 6.3 today -will use low K bath.   4. Hypertension/volume:BP low/stable, close to EDW.  UF as tolerated. 5. Anemia:Hgb8.9 today- Recent ESA dose as outpatient  6. Metabolic bone disease:Ca/Phos ok - continue home binders and sensipar. 7. Spina bifida 8. Chronic pain syndrome 9. R knee effusion: S/p joint aspiration 1/5. Repeat on 1/8 Cx pending.  No evidence DVT on doppler.  Suspected gout flare. Pain control remains an issue.  10. R foot pain - xray with no acute findings. No evidence DVT on doppler. 11. Low-grade fever: BCx and synovial fluid Cx on 1/5 negative to date.  Per primary.    Jen Mow, PA-C [facility] 01/12/2020,11:59 AM  LOS: 6 days

## 2020-01-12 NOTE — Plan of Care (Signed)
  Problem: Coping: Goal: Level of anxiety will decrease Outcome: Progressing   Problem: Pain Managment: Goal: General experience of comfort will improve Outcome: Progressing   Problem: Fluid Volume: Goal: Compliance with measures to maintain balanced fluid volume will improve Outcome: Progressing

## 2020-01-13 DIAGNOSIS — G4700 Insomnia, unspecified: Secondary | ICD-10-CM | POA: Diagnosis not present

## 2020-01-13 DIAGNOSIS — M25561 Pain in right knee: Secondary | ICD-10-CM | POA: Diagnosis not present

## 2020-01-13 DIAGNOSIS — T8249XA Other complication of vascular dialysis catheter, initial encounter: Secondary | ICD-10-CM | POA: Diagnosis not present

## 2020-01-13 DIAGNOSIS — E039 Hypothyroidism, unspecified: Secondary | ICD-10-CM | POA: Diagnosis not present

## 2020-01-13 LAB — BASIC METABOLIC PANEL
Anion gap: 12 (ref 5–15)
BUN: 82 mg/dL — ABNORMAL HIGH (ref 6–20)
CO2: 24 mmol/L (ref 22–32)
Calcium: 9.4 mg/dL (ref 8.9–10.3)
Chloride: 98 mmol/L (ref 98–111)
Creatinine, Ser: 9.32 mg/dL — ABNORMAL HIGH (ref 0.61–1.24)
GFR, Estimated: 6 mL/min — ABNORMAL LOW (ref >60)
Glucose, Bld: 104 mg/dL — ABNORMAL HIGH (ref 70–99)
Potassium: 5.9 mmol/L — ABNORMAL HIGH (ref 3.5–5.1)
Sodium: 134 mmol/L — ABNORMAL LOW (ref 135–145)

## 2020-01-13 LAB — CBC
HCT: 34 % — ABNORMAL LOW (ref 39.0–52.0)
Hemoglobin: 10 g/dL — ABNORMAL LOW (ref 13.0–17.0)
MCH: 28.7 pg (ref 26.0–34.0)
MCHC: 29.4 g/dL — ABNORMAL LOW (ref 30.0–36.0)
MCV: 97.7 fL (ref 80.0–100.0)
Platelets: 212 10*3/uL (ref 150–400)
RBC: 3.48 MIL/uL — ABNORMAL LOW (ref 4.22–5.81)
RDW: 17.6 % — ABNORMAL HIGH (ref 11.5–15.5)
WBC: 9 10*3/uL (ref 4.0–10.5)
nRBC: 0 % (ref 0.0–0.2)

## 2020-01-13 MED ORDER — BUPIVACAINE HCL (PF) 0.5 % IJ SOLN
10.0000 mL | Freq: Once | INTRAMUSCULAR | Status: AC
  Administered 2020-01-13: 10 mL
  Filled 2020-01-13 (×2): qty 10

## 2020-01-13 MED ORDER — SODIUM ZIRCONIUM CYCLOSILICATE 10 G PO PACK: 10.0000 g | PACK | ORAL | Status: DC

## 2020-01-13 MED ORDER — OXYCODONE HCL 5 MG PO TABS
5.0000 mg | ORAL_TABLET | Freq: Four times a day (QID) | ORAL | Status: DC | PRN
Start: 2020-01-13 — End: 2020-01-15
  Administered 2020-01-13 – 2020-01-15 (×5): 5 mg via ORAL
  Filled 2020-01-13 (×7): qty 1

## 2020-01-13 MED ORDER — SODIUM ZIRCONIUM CYCLOSILICATE 10 G PO PACK: 10.0000 g | PACK | Freq: Two times a day (BID) | ORAL | Status: DC

## 2020-01-13 MED ORDER — SODIUM ZIRCONIUM CYCLOSILICATE 10 G PO PACK
10.0000 g | PACK | Freq: Once | ORAL | Status: AC
  Administered 2020-01-13: 10 g via ORAL
  Filled 2020-01-13: qty 1

## 2020-01-13 MED ORDER — CHLORHEXIDINE GLUCONATE CLOTH 2 % EX PADS: 6.0000 | MEDICATED_PAD | Freq: Every day | CUTANEOUS | Status: DC

## 2020-01-13 MED ORDER — FLUTICASONE PROPIONATE 50 MCG/ACT NA SUSP
2.0000 | NASAL | Status: DC | PRN
  Administered 2020-01-13: 2 via NASAL
  Filled 2020-01-13: qty 16

## 2020-01-13 MED ORDER — OXYCODONE-ACETAMINOPHEN 5-325 MG PO TABS
1.0000 | ORAL_TABLET | Freq: Four times a day (QID) | ORAL | Status: DC | PRN
  Administered 2020-01-13 – 2020-01-15 (×5): 1 via ORAL
  Filled 2020-01-13 (×7): qty 1

## 2020-01-13 MED ORDER — METHYLPREDNISOLONE ACETATE 80 MG/ML IJ SUSP
80.0000 mg | Freq: Once | INTRAMUSCULAR | Status: AC
  Administered 2020-01-13: 80 mg via INTRA_ARTICULAR
  Filled 2020-01-13: qty 1

## 2020-01-13 NOTE — Progress Notes (Signed)
Patient ID: [name], male   DOB: Jun 18, 1965, 55 y.o.   MRN: [id number]   LOS: 7 days   Subjective: C/o continued severe right proximal medial lower leg pain with still severe but improved knee pain and moderate and improved ankle pain.   Objective: Vital signs in last 24 hours: Temp:  [98.1 F (36.7 C)-99.2 F (37.3 C)] 99 F (37.2 C) (01/11 0945) Pulse Rate:  [62-71] 62 (01/11 0945) Resp:  [13-16] 16 (01/11 0945) BP: (100-149)/(54-78) 120/61 (01/11 0945) SpO2:  [98 %-100 %] 100 % (01/11 0945) Weight:  [120 kg] 120 kg (01/10 1417) Last BM Date: 01/12/20   Laboratory  CBC Recent Labs    01/12/20 0322 01/13/20 0405  WBC 7.5 9.0  HGB 8.9* 10.0*  HCT 30.4* 34.0*  PLT 191 212   BMET Recent Labs    01/12/20 0322 01/13/20 0825  NA 132* 134*  K 6.3* 5.9*  CL 96* 98  CO2 22 24  GLUCOSE 97 104*  BUN 104* 82*  CREATININE 11.41* 9.32*  CALCIUM 9.4 9.4     Physical Exam General appearance: alert and no distress  RLL: Knee swollen, calf mod TTP, firm but compartments compressible, 1+ DP, no sig pedal edema   Assessment/Plan: Gout -- Explained the natural history of gout and reassured pt that periarticular inflammation and fevers can occur. Will inject knee with depomedrol/marcaine to see if he can get some relief.    Lisette Abu, PA-C Orthopedic Surgery [phone] 01/13/2020

## 2020-01-13 NOTE — Progress Notes (Signed)
[facility] Progress Note   Subjective:   Patient seen and examined at bedside.  Reports dialysis went well following activase dwell yesterday.  Pain slightly better today in foot/knee, now mostly in calf, states "I may be getting over the hump".  Denies CP, SOB, abdominal pain, weakness, dizziness and fatigue.   Objective Vitals:   01/12/20 1417 01/12/20 1646 01/12/20 2050 01/13/20 0440  BP: 123/78 (!) 149/54 122/73 (!) 100/54  Pulse: 63  71 62  Resp: 13 16 16 16   Temp: 98.5 F (36.9 C) 98.1 F (36.7 C) 99.2 F (37.3 C) 98.7 F (37.1 C)  TempSrc: Oral Oral Oral Oral  SpO2: 98% 99% 98%   Weight: 120 kg      Physical Exam General:chronically ill appearing male in NAD Heart:RRR, +4/4 systolic murmur  Lungs:CTAB, nml WOB Abdomen:soft, NTND Extremities:no LE edema, R knee tenderness/swelling Dialysis Access: R thigh [facility]  Filed Weights   01/09/20 1105 01/12/20 1025 01/12/20 1417  Weight: 120.4 kg 123 kg 120 kg    Intake/Output Summary (Last 24 hours) at 01/13/2020 0900 Last data filed at 01/12/2020 2104 Gross per 24 hour  Intake 630 ml  Output 3150 ml  Net -2520 ml    Additional Objective Labs: Basic Metabolic Panel: Recent Labs  Lab 01/09/20 0803 01/10/20 0212 01/11/20 0407 01/12/20 0322 01/13/20 0825  NA 133* 136 134* 132* 134*  K 5.0 4.5 5.8* 6.3* 5.9*  CL 92* 95* 96* 96* 98  CO2 25 25 25 22 24   GLUCOSE 89 102* 97 97 104*  BUN 59* 46* 77* 104* 82*  CREATININE 9.15* 7.01* 9.40* 11.41* 9.32*  CALCIUM 9.1 9.0 9.4 9.4 9.4  PHOS 5.4* 2.9 2.8  --   --    Liver Function Tests: Recent Labs  Lab 01/09/20 0803 01/10/20 0212 01/11/20 0407  ALBUMIN 3.0* 3.0* 2.9*   CBC: Recent Labs  Lab 01/09/20 0803 01/10/20 0212 01/11/20 0407 01/12/20 0322 01/13/20 0405  WBC 6.5 7.8 6.9 7.5 9.0  HGB 9.7* 9.5* 9.0* 8.9* 10.0*  HCT 33.6* 31.1* 30.7* 30.4* 34.0*  MCV 96.6 95.1 95.9 95.9 97.7  PLT 183 183 181 191 212   Blood Culture    Component  Value Date/Time   SDES BLOOD RIGHT ANTECUBITAL 01/12/2020 1711   SPECREQUEST  01/12/2020 1711    BOTTLES DRAWN AEROBIC ONLY Blood Culture results may not be optimal due to an inadequate volume of blood received in culture bottles   CULT  01/12/2020 1711    NO GROWTH < 24 HOURS Performed at [facility], [address] [place], [place] [address]    REPTSTATUS PENDING 01/12/2020 1711   Studies/Results: DG Foot 2 Views Right  Result Date: 01/11/2020 CLINICAL DATA:  Right foot pain. EXAM: RIGHT FOOT - 2 VIEW COMPARISON:  None. FINDINGS: There is nonspecific soft tissue swelling about the foot and ankle. There is an old healed fracture of the fifth metatarsal. No definite acute displaced fracture or dislocation on this study. There is a large osteophyte arising from the dorsal anterior talus. There is a probable old injury involving the dorsal navicular. There are extensive degenerative changes of the midfoot. There is an ankle joint effusion. No radiographic evidence for osteomyelitis. Vascular calcifications are noted. IMPRESSION: 1. No acute displaced fracture or dislocation. 2. There is nonspecific soft tissue swelling about the foot. 3. 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 Electronically Signed   By: Constance Holster M.D.   On: 01/11/2020  19:43   VAS [place] LOWER EXTREMITY VENOUS (DVT)  Result Date: 01/11/2020  Lower Venous DVT Study Indications: Pain, and Swelling.  Risk Factors: Surgery 12/21/2017 - RIGHT FEMORAL DIALYSIS CATHETER EXCHANGE. Limitations: Body habitus, poor ultrasound/tissue interface, bandages, line and patient positioning, patient pain tolerance. Comparison Study: 12/15/2019 - Negative for DVT. Performing Technologist: Oliver Hum RVT  Examination Guidelines: A complete evaluation includes B-mode imaging, spectral Doppler, color Doppler, and power Doppler as needed of all accessible portions of each vessel. Bilateral testing is  considered an integral part of a complete examination. Limited examinations for reoccurring indications may be performed as noted. The reflux portion of the exam is performed with the patient in reverse Trendelenburg.  +---------+---------------+---------+-----------+----------+-------------------+ RIGHT    CompressibilityPhasicitySpontaneityPropertiesThrombus Aging      +---------+---------------+---------+-----------+----------+-------------------+ CFV      Full           Yes      Yes                                      +---------+---------------+---------+-----------+----------+-------------------+ SFJ      Full                                                             +---------+---------------+---------+-----------+----------+-------------------+ FV Prox  Full           Yes      Yes                                      +---------+---------------+---------+-----------+----------+-------------------+ FV Mid                                                HD access           +---------+---------------+---------+-----------+----------+-------------------+ FV Distal               Yes      Yes                                      +---------+---------------+---------+-----------+----------+-------------------+ PFV                                                   Not well visualized +---------+---------------+---------+-----------+----------+-------------------+ POP      Full           Yes      Yes                                      +---------+---------------+---------+-----------+----------+-------------------+ PTV      Full                                                             +---------+---------------+---------+-----------+----------+-------------------+  PERO                                                  Patency shown with                                                        color doppler        +---------+---------------+---------+-----------+----------+-------------------+   +----+---------------+---------+-----------+----------+--------------+ LEFTCompressibilityPhasicitySpontaneityPropertiesThrombus Aging +----+---------------+---------+-----------+----------+--------------+ CFV Full           Yes      Yes                                 +----+---------------+---------+-----------+----------+--------------+     Summary: RIGHT: - There is no evidence of deep vein thrombosis in the lower extremity. However, portions of this examination were limited- see technologist comments above.  - No cystic structure found in the popliteal fossa.  LEFT: - No evidence of common femoral vein obstruction.  *See table(s) above for measurements and observations. Electronically signed by Monica Martinez MD on 01/11/2020 at 4:18:06 PM.    Final     Medications:  . aspirin  325 mg Oral Daily  . calcium acetate  2,668 mg Oral TID WC  . Chlorhexidine Gluconate Cloth  6 each Topical Daily  . clopidogrel  75 mg Oral Daily  . escitalopram  5 mg Oral Daily  . famotidine  20 mg Oral QODAY  . heparin  5,000 Units Subcutaneous Q8H  . levothyroxine  137 mcg Oral QAC breakfast  . lidocaine  1 patch Transdermal Q24H  . predniSONE  40 mg Oral Q breakfast  . rosuvastatin  10 mg Oral Daily  . traZODone  100 mg Oral QHS  . zolpidem  10 mg Oral QHS    Dialysis Orders: MWF at [facility] 4:45hr, 200 dialyzer, 350/800, EDW 117kg, 2K/2.25Ca, TDC, heparin 3700 bolus - Sensipar 30mg  PO q HD - Venofer 50mg  IV q week - Mircera 2102mcg IV q 2 weeks (last 1/3)  Assessment/Plan: 1. Dysfunctional TDC: Continues to be an issue.  Requiring repeat cath flow dwells.  Pt has decided to hold off on [facility] exchange for now. No vascular surgery procedures anticipated at this time.   2. ESRD:Continue HD per MWF schedule.  HD tomorrow per regular schedule.  Monitor over weekend. 3. Hyperkalemia - Usually takes kayexelate over  weekends as OP.  K 5.9 this AM. Give lokelma today.  4. Hypertension/volume:BP low/stable, close to EDW.  UF as tolerated. 5. Anemia:Hgb^10.0 - Recent ESA dose as outpatient  6. Metabolic bone disease:Ca/Phos ok - continue home binders and sensipar. 7. Spina bifida 8. Chronic pain syndrome 9. R knee effusion: S/p joint aspiration 1/5.Repeat on 1/8 Cx pending.  No evidence DVT on doppler. Suspected gout flare. Pain control remains an issue. Improving today.  10. R foot pain - xray with no acute findings. No evidence DVT on doppler. 11. Low-grade fever: BCx and synovial fluid Cx on 1/5 negative to date. Per primary.   Jen Mow, PA-C [facility] 01/13/2020,9:00 AM  LOS: 7 days

## 2020-01-13 NOTE — Evaluation (Signed)
Physical Therapy Evaluation Patient Details Name: [name] MRN: [id number] DOB: 1965-04-04 Today's Date: 01/13/2020   History of Present Illness  55yo male presenting with clotted R femoral HD cath, R knee pain. PMH ESRD on HD, seizures, HTN, aortic stenosis, spina bifida  Clinical Impression   Patient received in bed, pleasant and cooperative but willing to participate in PT session. Able to mobilize on a min guard-MinA basis with RW but was limited to in-room distances due to R knee pain, and baseline level of function is really only transfers to [facility] at home at this point. Gait pattern very antalgic and self selected WB on ball of foot likely due to calf tightness. Left up in recliner with all needs met this afternoon. Feel he will do well with return to [place] follow-up at [place].     Follow Up Recommendations Home health PT;Supervision for mobility/OOB    Equipment Recommendations  None recommended by PT (well equipped)    Recommendations for Other Services       Precautions / Restrictions Precautions Precautions: Fall;Other (comment) Precaution Comments: R knee pain Restrictions Weight Bearing Restrictions: No RLE Weight Bearing: Weight bearing as tolerated LLE Weight Bearing: Weight bearing as tolerated      Mobility  Bed Mobility Overal bed mobility: Needs Assistance Bed Mobility: Supine to Sit     Supine to sit: Supervision;HOB elevated     General bed mobility comments: increased time, needs assist at times to move objects around in bed (removing pillow under legs, etc)    Transfers Overall transfer level: Needs assistance Equipment used: Rolling walker (2 wheeled) Transfers: Sit to/from Stand Sit to Stand: Min assist         General transfer comment: MinA to boost up to standing and steady due to R knee pain  Ambulation/Gait Ambulation/Gait assistance: Min guard Gait Distance (Feet): 14 Feet Assistive device: Rolling walker (2 wheeled) Gait  Pattern/deviations: Step-through pattern;Decreased stride length;Trunk flexed;Wide base of support;Antalgic;Decreased weight shift to right Gait velocity: decr   General Gait Details: min guard for safety, tends to ambulate with flexed posture/wide BOS and touches with toes on R side likely due to calf tightness. Antalgic  Stairs            Wheelchair Mobility    Modified Rankin (Stroke Patients Only)       Balance Overall balance assessment: Needs assistance Sitting-balance support: No upper extremity supported;Feet supported Sitting balance-Leahy Scale: Good     Standing balance support: Bilateral upper extremity supported;During functional activity Standing balance-Leahy Scale: Fair Standing balance comment: reliant on RW and min guard for safety                             Pertinent Vitals/Pain Pain Assessment: Faces Faces Pain Scale: Hurts even more Pain Location: R knee Pain Descriptors / Indicators: Aching;Discomfort Pain Intervention(s): Limited activity within patient's tolerance;Monitored during session;Repositioned    Home Living Family/patient expects to be discharged to:: Private residence Living Arrangements: Other relatives Building services engineer) Available Help at Discharge: Family;Available PRN/intermittently Type of Home: House Home Access: Level entry;Stairs to enter   Entrance Stairs-Number of Steps: 1 Home Layout: One level Home Equipment: Wheelchair - manual;Wheelchair - power;Hospital bed;Walker - 2 wheels;Adaptive equipment      Prior Function Level of Independence: Independent with assistive device(s)         Comments: pt reports using RW for mobility, occasionally w/c if he is feeling tired. Has been getting HHPT,  schedules it on days opposite dialysis due to history of dizziness and chest pain post-HD.     Hand Dominance   Dominant Hand: Right    Extremity/Trunk Assessment   Upper Extremity Assessment Upper Extremity  Assessment: Overall WFL for tasks assessed    Lower Extremity Assessment Lower Extremity Assessment: Overall WFL for tasks assessed RLE Deficits / Details: R LE painful and self-selects flexed position and antalgic with gait    Cervical / Trunk Assessment Cervical / Trunk Assessment: Kyphotic  Communication   Communication: No difficulties  Cognition   Behavior During Therapy: WFL for tasks assessed/performed;Anxious Overall Cognitive Status: Within Functional Limits for tasks assessed                                 General Comments: frustrated with R knee pain and general health situation, but agreeable to mobility      General Comments      Exercises     Assessment/Plan    PT Assessment Patient needs continued PT services  PT Problem List Decreased strength;Decreased mobility;Decreased safety awareness;Decreased activity tolerance;Decreased balance;Decreased range of motion;Decreased knowledge of use of DME;Pain;Decreased skin integrity       PT Treatment Interventions DME instruction;Therapeutic activities;Gait training;Therapeutic exercise;Patient/family education;Balance training;Stair training;Functional mobility training;Neuromuscular re-education    PT Goals (Current goals can be found in the Care Plan section)  Acute Rehab PT Goals Patient Stated Goal: pain control, go home PT Goal Formulation: With patient Time For Goal Achievement: 01/27/20 Potential to Achieve Goals: Fair    Frequency Min 2X/week (minimum- will attempt to see 3x/week only if we can make it before HD on dialysis days)   Barriers to discharge        Co-evaluation               AM-PAC PT "6 Clicks" Mobility  Outcome Measure Help needed turning from your back to your side while in a flat bed without using bedrails?: None Help needed moving from lying on your back to sitting on the side of a flat bed without using bedrails?: None Help needed moving to and from a bed to  a chair (including a wheelchair)?: A Little Help needed standing up from a chair using your arms (e.g., wheelchair or bedside chair)?: A Little Help needed to walk in hospital room?: A Little Help needed climbing 3-5 steps with a railing? : Total 6 Click Score: 18    End of Session Equipment Utilized During Treatment: Gait belt Activity Tolerance: Patient tolerated treatment well Patient left: in chair;with call bell/phone within reach Nurse Communication: Mobility status PT Visit Diagnosis: Other abnormalities of gait and mobility (R26.89);Muscle weakness (generalized) (M62.81);Difficulty in walking, not elsewhere classified (R26.2)    Time: 5638-7564 PT Time Calculation (min) (ACUTE ONLY): 30 min   Charges:   PT Evaluation $PT Eval Moderate Complexity: 1 Mod PT Treatments $Gait Training: 8-22 mins        Windell Norfolk, DPT, PN1   Supplemental Physical Therapist [place]    Pager [phone] Acute Rehab Office [phone]

## 2020-01-13 NOTE — Progress Notes (Signed)
Subjective:   Patient continues to have pain to the right knee and lower leg.  Knee pain seems to be improving, no other acute complaints this morning.  He notes that he missed his orthopedic surgeon appointment yesterday due to hospitalization.  Objective:  Vital signs in last 24 hours: Vitals:   01/12/20 1417 01/12/20 1646 01/12/20 2050 01/13/20 0440  BP: 123/78 (!) 149/54 122/73 (!) 100/54  Pulse: 63  71 62  Resp: 13 16 16 16   Temp: 98.5 F (36.9 C) 98.1 F (36.7 C) 99.2 F (37.3 C) 98.7 F (37.1 C)  TempSrc: Oral Oral Oral Oral  SpO2: 98% 99% 98%   Weight: 120 kg       Physical Exam Constitutional: appears uncomfortable Head: atraumatic ENT: external ears normal Eyes: EOMI Cardiovascular: regular rate and rhythm, normal heart sounds Pulmonary: effort normal Abdominal: flat, ileostomy bag in place Musculoskeletal: R knee effusion and swelling which is improved, no longer warm compared to other side, tenderness to right ankle, dorsal aspect of foot, and shin area Skin: warm and dry, dressing on L thigh over chronic wound from prior dialysis access Neurological: alert, no focal deficit Psychiatric: Tearful and anxious  Assessment/Plan:  Principal Problem:   Clotted dialysis access [facility]) Active Problems:   ESRD (end stage renal disease) on dialysis (Trainer)   Hypothyroidism   Acute gout of right knee  [name] is a 55 y.o. male with hx of ESRD on MWF dialysis, HTN, CAD, severe aortic stenosis, spina bifida, chronic opioid use, L knee septic arthritris presenting with dialysis access issue and L knee pain. Has decreased but adequate flow currently. Treating for gout of R knee which seems improving, and also likely gout of R foot.  Has had recurrent fevers without any clear infectious source.  Acute gout of R knee and foot Patient continues to have severe right knee pain and swelling. First arthrocentesis with white count 44,000 and urate crystals. Started  prednisone 40 mg daily with gradual improvement.  Repeat joint aspiration again demonstrated a uric acid crystals and 7000 white blood cells. Knee pain is improving on exam.  Cultures have shown NGTD. Also developed pain in R foot which is swollen and warm with severe pain on ROM. Exam is consistent with gout. Xray negative.  -f/u arthrocentesis cultures -continue prednisone 40mg  daily -Continue Dilaudid to 2mg  q4hr for pain control  -Patient continues to request orthopedic evaluation, will consult with them.  Appreciate assistance.  Recurrent fevers Most recent fever on 1/10 of 100.4.  Has had 3 fevers this admission.  No leukocytosis. Gout is a possible source, but he is at risk for an infection of his HD catheter. So far arthrocentesis and blood cultures have shown no growth to date. He also has two chronic skin wounds; left flank wound 2/2 nephrostomy tube which is weeping but does not look infected. And a second wound to his left thigh from previous catheter placements which looks healing.  Wound consult has seen him and signed off.  If he continues to spike fevers will repeat BC and consider starting antibiotics. -Wound care consult appreciated, signed off -Follow-up blood and arthrocentesis cultures, no growth to date -daily CBC  ESRD on MWF dialysis Impaired dialysis access Hyperkalemia Patient is well-known to the vascular surgery department and has had previous access to upper extremities and in the left lower extremity, now has a right femoral tunneled dialysis catheter. Presented for poor flow during dialysis. Has had adequate flow here after treating the  tPA. Vascular surgery has been following, and together with patient has decided to forego catheter exchange for now.  -vascular surgery signed off, appreciate assistance -nephro consulted, appreciate recs     -HD MWF -Continue home PhosLo -Daily BMP -Remains hyperkalemic.  Given another dose of Lokelma today. -Avoid  nephrotoxins  Prior to Admission Living Arrangement: Home Anticipated Discharge Location: Home Barriers to Discharge: Pain control, rule out infection Dispo: Anticipated discharge pending clinical improvement.   Darrell Au, MD 01/13/2020, 6:30 AM Pager: [phone] After 5pm on weekdays and 1pm on weekends: On Call pager [phone]

## 2020-01-14 DIAGNOSIS — T8249XA Other complication of vascular dialysis catheter, initial encounter: Secondary | ICD-10-CM | POA: Diagnosis not present

## 2020-01-14 DIAGNOSIS — M25561 Pain in right knee: Secondary | ICD-10-CM | POA: Diagnosis not present

## 2020-01-14 DIAGNOSIS — G4700 Insomnia, unspecified: Secondary | ICD-10-CM | POA: Diagnosis not present

## 2020-01-14 DIAGNOSIS — E039 Hypothyroidism, unspecified: Secondary | ICD-10-CM | POA: Diagnosis not present

## 2020-01-14 LAB — CBC
HCT: 30.4 % — ABNORMAL LOW (ref 39.0–52.0)
HCT: 33.1 % — ABNORMAL LOW (ref 39.0–52.0)
Hemoglobin: 9.1 g/dL — ABNORMAL LOW (ref 13.0–17.0)
Hemoglobin: 9.7 g/dL — ABNORMAL LOW (ref 13.0–17.0)
MCH: 28.6 pg (ref 26.0–34.0)
MCH: 28.2 pg (ref 26.0–34.0)
MCHC: 29.9 g/dL — ABNORMAL LOW (ref 30.0–36.0)
MCHC: 29.3 g/dL — ABNORMAL LOW (ref 30.0–36.0)
MCV: 95.6 fL (ref 80.0–100.0)
MCV: 96.2 fL (ref 80.0–100.0)
Platelets: 168 10*3/uL (ref 150–400)
Platelets: 189 10*3/uL (ref 150–400)
RBC: 3.18 MIL/uL — ABNORMAL LOW (ref 4.22–5.81)
RBC: 3.44 MIL/uL — ABNORMAL LOW (ref 4.22–5.81)
RDW: 17.3 % — ABNORMAL HIGH (ref 11.5–15.5)
RDW: 17.7 % — ABNORMAL HIGH (ref 11.5–15.5)
WBC: 9 10*3/uL (ref 4.0–10.5)
WBC: 10.2 10*3/uL (ref 4.0–10.5)
nRBC: 0 % (ref 0.0–0.2)
nRBC: 0 % (ref 0.0–0.2)

## 2020-01-14 LAB — POTASSIUM
Potassium: 4.2 mmol/L (ref 3.5–5.1)
Potassium: 4.7 mmol/L (ref 3.5–5.1)
Potassium: 5 mmol/L (ref 3.5–5.1)

## 2020-01-14 LAB — BASIC METABOLIC PANEL
Anion gap: 15 (ref 5–15)
Anion gap: 16 — ABNORMAL HIGH (ref 5–15)
BUN: 102 mg/dL — ABNORMAL HIGH (ref 6–20)
BUN: 68 mg/dL — ABNORMAL HIGH (ref 6–20)
CO2: 23 mmol/L (ref 22–32)
CO2: 24 mmol/L (ref 22–32)
Calcium: 9.2 mg/dL (ref 8.9–10.3)
Calcium: 9.3 mg/dL (ref 8.9–10.3)
Chloride: 96 mmol/L — ABNORMAL LOW (ref 98–111)
Chloride: 92 mmol/L — ABNORMAL LOW (ref 98–111)
Creatinine, Ser: 10.06 mg/dL — ABNORMAL HIGH (ref 0.61–1.24)
Creatinine, Ser: 7.11 mg/dL — ABNORMAL HIGH (ref 0.61–1.24)
GFR, Estimated: 6 mL/min — ABNORMAL LOW (ref >60)
GFR, Estimated: 8 mL/min — ABNORMAL LOW (ref >60)
Glucose, Bld: 127 mg/dL — ABNORMAL HIGH (ref 70–99)
Glucose, Bld: 99 mg/dL (ref 70–99)
Potassium: 6.9 mmol/L (ref 3.5–5.1)
Potassium: 4.3 mmol/L (ref 3.5–5.1)
Sodium: 134 mmol/L — ABNORMAL LOW (ref 135–145)
Sodium: 132 mmol/L — ABNORMAL LOW (ref 135–145)

## 2020-01-14 LAB — RENAL FUNCTION PANEL
Albumin: 3 g/dL — ABNORMAL LOW (ref 3.5–5.0)
Anion gap: 16 — ABNORMAL HIGH (ref 5–15)
BUN: 101 mg/dL — ABNORMAL HIGH (ref 6–20)
CO2: 22 mmol/L (ref 22–32)
Calcium: 9.1 mg/dL (ref 8.9–10.3)
Chloride: 96 mmol/L — ABNORMAL LOW (ref 98–111)
Creatinine, Ser: 9.59 mg/dL — ABNORMAL HIGH (ref 0.61–1.24)
GFR, Estimated: 6 mL/min — ABNORMAL LOW (ref >60)
Glucose, Bld: 125 mg/dL — ABNORMAL HIGH (ref 70–99)
Phosphorus: 3.8 mg/dL (ref 2.5–4.6)
Potassium: 5.4 mmol/L — ABNORMAL HIGH (ref 3.5–5.1)
Sodium: 134 mmol/L — ABNORMAL LOW (ref 135–145)

## 2020-01-14 LAB — BODY FLUID CULTURE: Culture: NO GROWTH

## 2020-01-14 LAB — GLUCOSE, CAPILLARY
Glucose-Capillary: 111 mg/dL — ABNORMAL HIGH (ref 70–99)
Glucose-Capillary: 97 mg/dL (ref 70–99)

## 2020-01-14 MED ORDER — CALCIUM GLUCONATE-NACL 1-0.675 GM/50ML-% IV SOLN
1.0000 g | INTRAVENOUS | Status: AC
  Administered 2020-01-14: 1000 mg via INTRAVENOUS
  Filled 2020-01-14: qty 50

## 2020-01-14 MED ORDER — DEXTROSE 50 % IV SOLN: 1.0000 | Freq: Once | INTRAVENOUS | Status: AC

## 2020-01-14 MED ORDER — SODIUM ZIRCONIUM CYCLOSILICATE 10 G PO PACK
10.0000 g | PACK | Freq: Once | ORAL | Status: AC
  Administered 2020-01-14: 10 g via ORAL
  Filled 2020-01-14: qty 1

## 2020-01-14 MED ORDER — PROMETHAZINE HCL 25 MG/ML IJ SOLN
INTRAMUSCULAR | Status: AC
  Filled 2020-01-14: qty 1

## 2020-01-14 MED ORDER — SODIUM BICARBONATE 8.4 % IV SOLN
Freq: Once | INTRAVENOUS | Status: DC
  Filled 2020-01-14: qty 100

## 2020-01-14 MED ORDER — CALCIUM GLUCONATE-NACL 1-0.675 GM/50ML-% IV SOLN
1.0000 g | Freq: Once | INTRAVENOUS | Status: DC
  Filled 2020-01-14: qty 50

## 2020-01-14 MED ORDER — HEPARIN SODIUM (PORCINE) 1000 UNIT/ML IJ SOLN
INTRAMUSCULAR | Status: AC
  Administered 2020-01-14: 3700 [IU]
  Filled 2020-01-14: qty 4

## 2020-01-14 MED ORDER — ALTEPLASE 2 MG IJ SOLR
INTRAMUSCULAR | Status: AC
  Administered 2020-01-14: 5.8 mg
  Filled 2020-01-14: qty 2

## 2020-01-14 MED ORDER — INSULIN ASPART 100 UNIT/ML IV SOLN
10.0000 [IU] | Freq: Once | INTRAVENOUS | Status: AC
  Administered 2020-01-14: 10 [IU] via INTRAVENOUS

## 2020-01-14 MED ORDER — DEXTROSE 50 % IV SOLN
INTRAVENOUS | Status: AC
  Administered 2020-01-14: 50 mL via INTRAVENOUS
  Filled 2020-01-14: qty 50

## 2020-01-14 MED ORDER — SODIUM BICARBONATE 8.4 % IV SOLN
INTRAVENOUS | Status: AC
  Filled 2020-01-14: qty 100

## 2020-01-14 MED ORDER — ALTEPLASE 2 MG IJ SOLR
INTRAMUSCULAR | Status: AC
  Filled 2020-01-14: qty 4

## 2020-01-14 NOTE — Plan of Care (Signed)

## 2020-01-14 NOTE — Progress Notes (Signed)
PT Cancellation Note  Patient Details Name: [name] MRN: [id number] DOB: 05-30-1965   Cancelled Treatment:    Reason Eval/Treat Not Completed: Patient at procedure or test/unavailable at HD and unavailable for PT. He has specifically requested we not see him after HD due to poor activity tolerance afterwards, so will follow up on next date of service at this point.    Windell Norfolk, DPT, PN1   Supplemental Physical Therapist [facility]    Pager [phone] Acute Rehab Office [phone]

## 2020-01-14 NOTE — Progress Notes (Signed)
BMP this AM showed potassium of 6.9. EKG showed sinus bradycardia , QTC 430 and QRS 98. Patient reports feeling well with no chest pain.   Given K of 6.9 and bradycardia on EKG, treat as hyperkalemic emergency.  -Calcium gluconate 1g IV -Sodium bicarb IV 100 mEq -Novolog 10 units IV with dextrose  -Lokelma 10 g  -Place patient on telemetry  I spoke to Dr. Justin Mend, the nephrologist on call, and he agreed with our treatment. He will put patient on the list to get HD first today.  Will repeat EKG in 1 hour to monitor.

## 2020-01-14 NOTE — Progress Notes (Signed)
CRITICAL VALUE ALERT  Critical Value:  Potassium=6.9  Date & Time Notied:  11/14/2019 0401  Provider Notified: Dr. Alfonse Spruce  Orders Received/Actions taken: New orders placed in [facility] to be carried out.  Nursing staff to continue to monitor.

## 2020-01-14 NOTE — Progress Notes (Signed)
Subjective:   Patient states that his pain is improving today. This is day 6 of prednisone, and he also received steroid/marcaine injection of his knee from ortho yesterday. No other complaints this morning.  Objective:  Vital signs in last 24 hours: Vitals:   01/13/20 0945 01/13/20 1846 01/13/20 2010 01/14/20 0554  BP: 120/61 127/62 121/62 129/72  Pulse: 62 74 70 (!) 58  Resp: 16 16 20 18   Temp: 99 F (37.2 C) 98.1 F (36.7 C) 98.9 F (37.2 C) 98.9 F (37.2 C)  TempSrc: Oral  Oral Oral  SpO2: 100% 99% 99% 99%  Weight:        Physical Exam Constitutional: appears more comfortable Head: atraumatic ENT: external ears normal Eyes: EOMI Cardiovascular: regular rate and rhythm, normal heart sounds Pulmonary: effort normal Abdominal: flat, ileostomy bag in place Musculoskeletal: R knee effusion and swelling which is improved, no longer warm compared to other side, tenderness to right ankle without effusion Skin: warm and dry, dressing on L thigh over chronic wound from prior dialysis access Neurological: alert, no focal deficit Psychiatric: anxious  Assessment/Plan:  Principal Problem:   Clotted dialysis access [facility]) Active Problems:   ESRD (end stage renal disease) on dialysis (HCC)   Hypothyroidism   Acute gout of right knee  [name] is a 55 y.o. male with hx of ESRD on MWF dialysis, HTN, CAD, severe aortic stenosis, spina bifida, chronic opioid use, L knee septic arthritris presenting with dialysis access issue and L knee pain. Has decreased but adequate flow currently. Treating for gout of R knee which seems improving, and also likely gout of R foot.  Has had recurrent fevers without any clear infectious source, cultures negative to date.  Acute gout of R knee and foot Patient continues to have severe right knee pain and swelling. First arthrocentesis with white count 44,000 and urate crystals. Started prednisone 40 mg daily with gradual improvement.  Repeat  joint aspiration again demonstrated a uric acid crystals and 7000 white blood cells. Also developed pain in R foot which is tender. Xray negative. Likely referred pain from knee. Knee pain is improving.  Cultures have shown NGTD. -f/u arthrocentesis cultures -received steroid/marcane injection of knee by ortho on 1/11 -continue prednisone 40mg  daily -Continue Dilaudid to 2mg  q4hr for pain control  -appreciate orthopedic surgery assistance  Recurrent fevers Most recent fever on 1/10 of 100.4.  Has had 3 fevers this admission.  No leukocytosis. Gout is a possible source, but he is at risk for an infection of his HD catheter. So far arthrocentesis and blood cultures have shown no growth. He also has two chronic skin wounds; left flank wound 2/2 nephrostomy tube which is weeping but does not look infected. And a second wound to his left thigh from previous catheter placements which looks healing.  Wound consult has seen him and signed off.  If he continues to spike fevers will repeat BC and consider starting antibiotics. -Wound care consult appreciated, signed off -Follow-up blood and arthrocentesis cultures, no growth to date -daily CBC  ESRD on MWF dialysis Impaired dialysis access Hyperkalemia Patient is well-known to the vascular surgery department and has had previous access to upper extremities and in the left lower extremity, now has a right femoral tunneled dialysis catheter. Presented for poor flow during dialysis. Has had adequate flow here after treating catheter with tPA. Vascular surgery has been following, and together with patient has decided to forego catheter exchange for now.  -vascular surgery signed off,  appreciate assistance -nephro consulted, appreciate recs     -HD MWF -Continue home PhosLo -Daily BMP -Remains hyperkalemic.  Given another dose of Lokelma today. -Avoid nephrotoxins  Prior to Admission Living Arrangement: Home Anticipated Discharge Location:  Home Barriers to Discharge: Pain control, rule out infection Dispo: Anticipated discharge pending clinical improvement.   [name], MD 01/14/2020, 6:43 AM Pager: [phone] After 5pm on weekdays and 1pm on weekends: On Call pager [phone]

## 2020-01-14 NOTE — Progress Notes (Signed)
[facility] Progress Note   Subjective:   Patient seen and examined at bedside in dialysis.  Catheter not working well this AM but making in through treatment for now.  K 6.9 this AM, using low K bath and rechecking K. Denies CP, SOB, palpitations, weakness and fatigue.  Requesting powder form of kayexalate. Still with pain in foot/leg but improved in knee.   Objective Vitals:   01/14/20 0800 01/14/20 0820 01/14/20 0840 01/14/20 0900  BP: 113/64 122/68 (!) 113/56 (!) 126/55  Pulse: (!) 57 (!) 55 (!) 59 60  Resp:   18 18  Temp:      TempSrc:      SpO2:    100%  Weight:       Physical Exam General:chronically ill appearing male in NAD Heart:RRR, +2/0 systolic murmur Lungs:mostly CTAB Abdomen:soft, NTND Extremities:no LE edema, R knee tenderness/swelling Dialysis Access: R thigh [facility]   Filed Weights   01/09/20 1105 01/12/20 1025 01/12/20 1417  Weight: 120.4 kg 123 kg 120 kg    Intake/Output Summary (Last 24 hours) at 01/14/2020 0933 Last data filed at 01/14/2020 0600 Gross per 24 hour  Intake 650 ml  Output --  Net 650 ml    Additional Objective Labs: Basic Metabolic Panel: Recent Labs  Lab 01/09/20 0803 01/10/20 0212 01/11/20 0407 01/12/20 0322 01/13/20 0825 01/14/20 0308  NA 133* 136 134* 132* 134* 134*  K 5.0 4.5 5.8* 6.3* 5.9* 6.9*  CL 92* 95* 96* 96* 98 96*  CO2 25 25 25 22 24 23   GLUCOSE 89 102* 97 97 104* 127*  BUN 59* 46* 77* 104* 82* 102*  CREATININE 9.15* 7.01* 9.40* 11.41* 9.32* 10.06*  CALCIUM 9.1 9.0 9.4 9.4 9.4 9.2  PHOS 5.4* 2.9 2.8  --   --   --    Liver Function Tests: Recent Labs  Lab 01/09/20 0803 01/10/20 0212 01/11/20 0407  ALBUMIN 3.0* 3.0* 2.9*   CBC: Recent Labs  Lab 01/11/20 0407 01/12/20 0322 01/13/20 0405 01/14/20 0308 01/14/20 0823  WBC 6.9 7.5 9.0 9.0 10.2  HGB 9.0* 8.9* 10.0* 9.1* 9.7*  HCT 30.7* 30.4* 34.0* 30.4* 33.1*  MCV 95.9 95.9 97.7 95.6 96.2  PLT 181 191 212 168 189   CBG: Recent Labs   Lab 01/14/20 0420 01/14/20 0601  GLUCAP 111* 97    Medications: . sodium bicarbonate in D5W 1000 mL infusion     . alteplase      . aspirin  325 mg Oral Daily  . calcium acetate  2,668 mg Oral TID WC  . Chlorhexidine Gluconate Cloth  6 each Topical Daily  . clopidogrel  75 mg Oral Daily  . escitalopram  5 mg Oral Daily  . famotidine  20 mg Oral QODAY  . heparin  5,000 Units Subcutaneous Q8H  . levothyroxine  137 mcg Oral QAC breakfast  . lidocaine  1 patch Transdermal Q24H  . predniSONE  40 mg Oral Q breakfast  . promethazine      . rosuvastatin  10 mg Oral Daily  . traZODone  100 mg Oral QHS  . zolpidem  10 mg Oral QHS    Dialysis Orders: MWF at [facility] 4:45hr, 200 dialyzer, 350/800, EDW 117kg, 2K/2.25Ca, TDC, heparin 3700 bolus - Sensipar 30mg  PO q HD - Venofer 50mg  IV q week - Mircera 230mcg IV q 2 weeks (last 1/3)  Assessment/Plan: 1. Dysfunctional NOB:[id number] to be an issue. Requiring repeat cath flow dwells. Pt has decided to hold off on [facility]  exchange for now. No vascular surgery procedures anticipated at this time.  2. ESRD:Continue HD per MWF schedule. HD today per regular schedule.  3. Hyperkalemia- Usually takes kayexelate over weekends as OP. K 6.9 this AM, getting lokelma.  Checking into whether we can get the powdered kayexalate.  4. Hypertension/volume:BP low/stable, close to EDW.UF as tolerated. 5. Anemia:Hgb 9.7 - Recent ESA dose as outpatient, not due until next week. 6. Metabolic bone disease:Ca/Phos ok - continue home bindersand sensipar. 7. Spina bifida 8. Chronic pain syndrome 9. R knee effusion: S/p joint aspiration 1/5.Repeat on 1/8 Cx pending.No evidence DVT on doppler. Suspected gout flare.Pain control remains an issue.Improving. 10. R foot pain- xray with no acute findings. No evidence DVT on doppler. 11. Low-grade fever: BCx and synovial fluid Cx on 1/5 negative to date. Per primary.   Jen Mow, PA-C [facility] 01/14/2020,9:33 AM  LOS: 8 days

## 2020-01-14 NOTE — Plan of Care (Signed)
  Problem: Education: Goal: Knowledge of General Education information will improve Description Including pain rating scale, medication(s)/side effects and non-pharmacologic comfort measures Outcome: Progressing   Problem: Health Behavior/Discharge Planning: Goal: Ability to manage health-related needs will improve Outcome: Progressing   

## 2020-01-15 ENCOUNTER — Ambulatory Visit: Payer: Medicare Other | Admitting: Cardiovascular Disease

## 2020-01-15 DIAGNOSIS — R509 Fever, unspecified: Secondary | ICD-10-CM

## 2020-01-15 LAB — CBC
HCT: 28.9 % — ABNORMAL LOW (ref 39.0–52.0)
Hemoglobin: 8.8 g/dL — ABNORMAL LOW (ref 13.0–17.0)
MCH: 28.8 pg (ref 26.0–34.0)
MCHC: 30.4 g/dL (ref 30.0–36.0)
MCV: 94.4 fL (ref 80.0–100.0)
Platelets: 173 10*3/uL (ref 150–400)
RBC: 3.06 MIL/uL — ABNORMAL LOW (ref 4.22–5.81)
RDW: 17.8 % — ABNORMAL HIGH (ref 11.5–15.5)
WBC: 7.1 10*3/uL (ref 4.0–10.5)
nRBC: 0 % (ref 0.0–0.2)

## 2020-01-15 LAB — BASIC METABOLIC PANEL
Anion gap: 14 (ref 5–15)
BUN: 76 mg/dL — ABNORMAL HIGH (ref 6–20)
CO2: 26 mmol/L (ref 22–32)
Calcium: 8.9 mg/dL (ref 8.9–10.3)
Chloride: 95 mmol/L — ABNORMAL LOW (ref 98–111)
Creatinine, Ser: 8.05 mg/dL — ABNORMAL HIGH (ref 0.61–1.24)
GFR, Estimated: 7 mL/min — ABNORMAL LOW (ref >60)
Glucose, Bld: 134 mg/dL — ABNORMAL HIGH (ref 70–99)
Potassium: 5.2 mmol/L — ABNORMAL HIGH (ref 3.5–5.1)
Sodium: 135 mmol/L (ref 135–145)

## 2020-01-15 LAB — POTASSIUM
Potassium: 5.1 mmol/L (ref 3.5–5.1)
Potassium: 4.7 mmol/L (ref 3.5–5.1)
Potassium: 4.8 mmol/L (ref 3.5–5.1)

## 2020-01-15 MED ORDER — OXYCODONE-ACETAMINOPHEN 5-325 MG PO TABS: 1.0000 | ORAL_TABLET | Freq: Four times a day (QID) | ORAL | Status: DC | PRN

## 2020-01-15 MED ORDER — NEPRO/CARBSTEADY PO LIQD
237.0000 mL | Freq: Three times a day (TID) | ORAL | Status: DC
  Administered 2020-01-15 – 2020-01-16 (×3): 237 mL via ORAL

## 2020-01-15 MED ORDER — HYDROMORPHONE HCL 2 MG PO TABS: 1.0000 mg | ORAL_TABLET | Freq: Three times a day (TID) | ORAL | Status: DC

## 2020-01-15 MED ORDER — FUROSEMIDE 80 MG PO TABS: 80.0000 mg | ORAL_TABLET | Freq: Two times a day (BID) | ORAL | Status: DC | PRN

## 2020-01-15 MED ORDER — SODIUM ZIRCONIUM CYCLOSILICATE 10 G PO PACK: 10.0000 g | PACK | Freq: Every day | ORAL | Status: DC

## 2020-01-15 MED ORDER — OXYCODONE HCL 5 MG PO TABS: 5.0000 mg | ORAL_TABLET | Freq: Four times a day (QID) | ORAL | Status: DC | PRN

## 2020-01-15 MED ORDER — OXYCODONE-ACETAMINOPHEN 5-325 MG PO TABS
2.0000 | ORAL_TABLET | Freq: Four times a day (QID) | ORAL | Status: DC | PRN
Start: 2020-01-15 — End: 2020-01-17
  Administered 2020-01-15 – 2020-01-16 (×5): 2 via ORAL
  Filled 2020-01-15 (×5): qty 2

## 2020-01-15 MED ORDER — HYDROMORPHONE HCL 1 MG/ML IJ SOLN
2.0000 mg | Freq: Once | INTRAMUSCULAR | Status: AC
  Administered 2020-01-15: 2 mg via INTRAVENOUS

## 2020-01-15 NOTE — Progress Notes (Signed)
Physical Therapy Treatment Patient Details Name: [name] MRN: [id number] DOB: 12-11-1965 Today's Date: 01/15/2020    History of Present Illness 55yo male presenting with clotted R femoral HD cath, R knee pain. PMH ESRD on HD, seizures, HTN, aortic stenosis, spina bifida    PT Comments    Pt supine in bed on arrival sleeping in sidelying.  He reports feeling nauseous, RN informed.  He was agreeable to short bout of gt training in room.  He remains painful with mobility and fatigues quickly.  Pt limited this session due to nausea.    Follow Up Recommendations  Home health PT;Supervision for mobility/OOB     Equipment Recommendations  None recommended by PT (well equipped.)    Recommendations for Other Services       Precautions / Restrictions Precautions Precautions: Fall;Other (comment) Precaution Comments: R knee pain Restrictions Weight Bearing Restrictions: No    Mobility  Bed Mobility Overal bed mobility: Needs Assistance Bed Mobility: Supine to Sit;Sit to Supine     Supine to sit: Supervision Sit to supine: Supervision   General bed mobility comments: Supervision for safety, moved back to bed and rested in sidelying.  Noted with B LE ankle and knee contractures.  Transfers Overall transfer level: Needs assistance Equipment used: Rolling walker (2 wheeled) Transfers: Sit to/from Stand Sit to Stand: Min guard;From elevated surface         General transfer comment: Min guard to rise into standing.  Cues for safety.  Ambulation/Gait Ambulation/Gait assistance: Min guard Gait Distance (Feet): 15 Feet Assistive device: Rolling walker (2 wheeled) Gait Pattern/deviations: Step-through pattern;Decreased stride length;Trunk flexed;Wide base of support;Antalgic;Decreased weight shift to right Gait velocity: decr   General Gait Details: min guard for safety, Continues to ambulate with flexed posture/wide BOS and touches with toes on B LEs likely due to  gastroc and HS tightness. Antalgic   Stairs             Wheelchair Mobility    Modified Rankin (Stroke Patients Only)       Balance Overall balance assessment: Needs assistance Sitting-balance support: No upper extremity supported;Feet supported Sitting balance-Leahy Scale: Good Sitting balance - Comments: no LOB seated EOB, able to sit with hands in lap but at times needs U UE for steadying   Standing balance support: Bilateral upper extremity supported;During functional activity Standing balance-Leahy Scale: Fair Standing balance comment: reliant on RW and min guard for safety                            Cognition Arousal/Alertness: Awake/alert Behavior During Therapy: WFL for tasks assessed/performed;Anxious Overall Cognitive Status: Within Functional Limits for tasks assessed                                 General Comments: frustrated with R knee pain and general health situation, but agreeable to mobility      Exercises      General Comments        Pertinent Vitals/Pain Pain Assessment: Faces Faces Pain Scale: Hurts even more Pain Location: R knee and abdominal pressure. Pain Descriptors / Indicators: Aching;Discomfort Pain Intervention(s): Monitored during session;Repositioned    Home Living                      Prior Function            PT Goals (current  goals can now be found in the care plan section) Acute Rehab PT Goals Patient Stated Goal: pain control, go home Potential to Achieve Goals: Fair Progress towards PT goals: Progressing toward goals    Frequency    Min 2X/week      PT Plan Current plan remains appropriate    Co-evaluation              AM-PAC PT "6 Clicks" Mobility   Outcome Measure  Help needed turning from your back to your side while in a flat bed without using bedrails?: None Help needed moving from lying on your back to sitting on the side of a flat bed without using  bedrails?: None Help needed moving to and from a bed to a chair (including a wheelchair)?: A Little Help needed standing up from a chair using your arms (e.g., wheelchair or bedside chair)?: A Little Help needed to walk in hospital room?: A Little Help needed climbing 3-5 steps with a railing? : Total 6 Click Score: 18    End of Session Equipment Utilized During Treatment: Gait belt Activity Tolerance: Patient tolerated treatment well Patient left: in chair;with call bell/phone within reach Nurse Communication: Mobility status PT Visit Diagnosis: Other abnormalities of gait and mobility (R26.89);Muscle weakness (generalized) (M62.81);Difficulty in walking, not elsewhere classified (R26.2)     Time: 7673-4193 PT Time Calculation (min) (ACUTE ONLY): 10 min  Charges:  $Gait Training: 8-22 mins                     Erasmo Leventhal , PTA Acute Rehabilitation Services Pager [phone] Office [phone]     [name] 01/15/2020, 4:05 PM

## 2020-01-15 NOTE — Care Management Important Message (Signed)
Important Message  Patient Details  Name: [name] MRN: [id number] Date of Birth: [birth year]   Medicare Important Message Given:  Yes     Easter Kennebrew P Starsha Morning 01/15/2020, 3:22 PM

## 2020-01-15 NOTE — Discharge Summary (Signed)
Name: [name] MRN: [id number] DOB: September 04, 1965 55 y.o. PCP: [name], MD  Date of Admission: 01/05/2020 12:44 PM Date of Discharge: 01/16/2020 01/16/20 Attending Physician: Lalla Brothers  Discharge Diagnosis: 1. ESRD on HD 2. Gout  Discharge Medications: Allergies as of 01/16/2020      Reactions   Hydrocodone Other (See Comments)   Caused involuntary movement and twitching. CANNOT TAKE DUE TO MUSCLE SPASMS AND MUSCLE TREMORS   Furadantin [nitrofurantoin] Other (See Comments)   UNSPECIFIED REACTION    Mandelamine [methenamine] Other (See Comments)   UNSPECIFIED REACTION    Noroxin [norfloxacin] Other (See Comments)   UNSPECIFIED REACTION    Colace [docusate] Diarrhea, Nausea And Vomiting   Sulfa Antibiotics Other (See Comments), Cough   Childhood reaction - pt could not confirm that it was a cough   Sulfur Cough   Childhood reaction - pt could not confirm that it was a cough      Medication List    TAKE these medications   acetaminophen 500 MG tablet Commonly known as: TYLENOL Take 1,000 mg by mouth every 6 (six) hours as needed for mild pain or headache.   aspirin 325 MG tablet Take 325 mg by mouth daily.   calcium acetate 667 MG capsule Commonly known as: PHOSLO Take 3-4 capsules (2,001-2,668 mg total) by mouth See admin instructions. Take 2708 mg by mouth 3 times daily with meals, take 2001 mg by mouth with snacks What changed:   how much to take  when to take this  additional instructions   clopidogrel 75 MG tablet Commonly known as: PLAVIX Take 75 mg by mouth daily.   diclofenac Sodium 1 % Gel Commonly known as: VOLTAREN Apply 2 g topically daily as needed (For leg pain).   famotidine 20 MG tablet Commonly known as: PEPCID Take 1 tablet (20 mg total) by mouth daily. Take after dialysis What changed:   additional instructions  Another medication with the same name was removed. Continue taking this medication, and follow the directions  you see here.   fluticasone 50 MCG/ACT nasal spray Commonly known as: FLONASE Place 2 sprays into both nostrils as needed for allergies or rhinitis (congestion). What changed: when to take this   furosemide 80 MG tablet Commonly known as: LASIX Take 80 mg by mouth 2 (two) times daily as needed for fluid.   levothyroxine 137 MCG tablet Commonly known as: SYNTHROID Take 137 mcg by mouth daily before breakfast.   LORazepam 1 MG tablet Commonly known as: ATIVAN Take 1 mg by mouth daily.   Nepro Liqd Take 237 mLs by mouth daily. Mixed Berry   oxyCODONE-acetaminophen 10-325 MG tablet Commonly known as: PERCOCET Take 1 tablet by mouth every 6 (six) hours as needed for pain. What changed: when to take this   promethazine 25 MG tablet Commonly known as: PHENERGAN Take 1 tablet (25 mg total) by mouth 2 (two) times daily as needed for nausea or vomiting.   rosuvastatin 10 MG tablet Commonly known as: CRESTOR Take 1 tablet (10 mg total) by mouth daily.   sodium polystyrene powder Commonly known as: KAYEXALATE Take 15 g by mouth See admin instructions. Take 15 g (mixed in water) by mouth on Saturdays and Sundays   traZODone 100 MG tablet Commonly known as: DESYREL Take 1 tablet (100 mg total) by mouth at bedtime.   zolpidem 10 MG tablet Commonly known as: AMBIEN Take 1 tablet (10 mg total) by mouth at bedtime.  Discharge Care Instructions  (From admission, onward)         Start     Ordered   01/16/20 0000  Discharge wound care:       Comments: Follow up with your regular wound care routine   01/16/20 1411          Disposition and follow-up:   [name] was discharged from [facility] in Stable condition.  At the hospital follow up visit please address:  1.  Follow up: . ESRD- presented for inadequate flow from dialysis catheter, but had adequate flow, after treating with Cathflow.  Completed several sessions this admission  adequately, continue MWF and reassess vascular access as needed . Recurrent fevers- negative infectious work-up, seems most likely secondary to gout, ensure resolution  2.  Labs / imaging needed at time of follow-up: None  3.  Pending labs/ test needing follow-up: Blood culture x1, arthrocentesis cultures x1  Follow-up Appointments:  Follow-up Information    [facility] Follow up.   Specialty: Home Health Services Contact information: [place] [id number] [place] Hospital Course by problem list:  ESRD on Monday Wednesday Friday dialysis Patient presented for adequate flow through dialysis catheter during outpatient dialysis session.  Discussed with vascular surgery, he elected to try again before any procedure.  Catheter was treated with Cathflo and then had adequate flow rates (~300-350) throughout remainder of hospitalization.  Patient vascular surgery decided together to forego catheter exchange for now.  He has exhausted vascular access at most locations, and per vascular surgery the next option may be translumbar.  Fevers Felt to be secondary to gout, though we wanted to ensure there was no infection of his dialysis catheter which would be very detrimental for him.  Septic arthritis of right knee was also a possibility given acute knee pain and history of septic arthritis in the left knee, but imaging was without acute bony abnormality and arthrocentesis has been reassuring.  He had 3 fevers on separate days during this admission, last of which was on 1/10.  Infectious work-up has been unrevealing with negative blood cultures x2 and arthrocentesis cultures x2.  Acute gout of right knee Patient concerned about the knee which was also addressed at previous admission in December.  Arthrocentesis was performed and reassuring, ultimately attributed to hemarthrosis.  Note he is on full-strength aspirin and Plavix for his dialysis access  which is tenuous.  He presented this admission with increasing pain and swelling.  Arthrocentesis was performed and showed 44,000 white blood cells and urate crystals.  Gram stain was negative. Started prednisone 40 mg for acute gout.  Arthrocentesis repeated for continued pain and increased swelling and showed 7000 white blood cells and urate crystals again.  Patient was evaluated by orthopedic surgery who performed a steroid injection.  Symptoms gradually improved afterwards.  Cultures have shown no growth.  Patient complained of severe pain to the knee and required escalating doses of opioids during this admission.  He is chronically on Percocet 10-3 25 every 6 hours as needed.  Was started on Dilaudid, also received a fentanyl patch.  Was later de-escalated to his home Percocet when his symptoms improved after steroids.  Discharge Vitals:   BP (!) 145/68 (BP Location: Right Arm)   Pulse 79   Temp 98.9 F (37.2 C) (Oral)   Resp 18   Wt 123.5 kg   SpO2  100%   BMI 37.97 kg/m   Pertinent Labs, Studies, and Procedures:  MR KNEE RIGHT WO CONTRAST  Result Date: 01/06/2020 CLINICAL DATA:  End-stage renal disease on hemodialysis. Right knee pain, instability and swelling. History of hemarthrosis. Recent arthroscopic knee surgery. EXAM: MRI OF THE RIGHT KNEE WITHOUT CONTRAST TECHNIQUE: Multiplanar, multisequence MR imaging of the knee was performed. No intravenous contrast was administered. COMPARISON:  MRI 12/17/2019 FINDINGS: MENISCI Medial meniscus:  Intact Lateral meniscus: Moderate degenerative changes and oblique coursing inferior articular surface tear involving the posterior horn mid body junction region. LIGAMENTS Cruciates:  Intact Collaterals:  Intact CARTILAGE Patellofemoral:  Moderate degenerative chondrosis. Medial:  Moderate degenerative chondrosis. Lateral:  Moderate degenerative chondrosis. Joint: There is a moderate to large joint effusion with septations and some internal debris. Mild  surrounding inflammatory changes but I do not see any obvious destructive cartilage changes or marrow edema to suggest septic arthritis. Joint aspiration may be helpful for further evaluation. Popliteal Fossa:  No popliteal mass or Baker's cyst. Extensor Mechanism: Severe chronic patellar tendinopathy and probable prior surgical changes. Patella Henderson Cloud is noted with an elongated and diseased patellar tendon. The quadriceps tendon is intact. Bones: No acute bony findings. No findings suspicious for osteomyelitis. Other: Unremarkable knee musculature. No findings for myositis or pyomyositis. IMPRESSION: 1. Moderate to large joint effusion with septations and some internal debris. Mild surrounding inflammatory changes but I do not see any obvious destructive cartilage changes or marrow edema to suggest septic arthritis. Joint aspiration may be helpful for further evaluation. 2. Intact ligamentous structures and no acute bony findings. 3. Degenerative changes and oblique coursing inferior articular surface tear involving the posterior horn mid body junction region of the lateral meniscus. 4. Severe chronic patellar tendinopathy and probable prior surgical changes. Electronically Signed   By: Marijo Sanes M.D.   On: 01/06/2020 08:37    01/07/20 arthrocentesis: 44,250 WBCs Red and cloudy Urate crystals  01/10/20 arthrocentesis 7,400 WBCs Red and turbid Urate crystals  Blood culture x2 on 1/10: no growth Arthrocentesis culture on 1/8: no growth Blood culture x2 on 1/5: no growth Arthrocentesis culture on 1/5: no growth    Discharge Instructions: Discharge Instructions    Diet - low sodium heart healthy   Complete by: As directed    Discharge instructions   Complete by: As directed    [name], you were hospitalized to address your dialysis catheter and knee pain. The catheter is now working adequately. The knee pain was found to be gout. You had several fevers this admission but had a thorough  workup which was negative for infectious source. Here are your discharge instructions.  1. Follow up with your PCP and nephrologist 2. Continue going to dialysis MWF, seek medical care if you have another issues with your catheter flow 3. Your gout should continue improving after the steroids you received. Continue using your home pain medication as needed 4. Take your famotidine only on days after dialysis   Discharge wound care:   Complete by: As directed    Follow up with your regular wound care routine   Increase activity slowly   Complete by: As directed       Signed: [name], MD 01/18/2020, 8:19 PM   Pager: [phone]

## 2020-01-15 NOTE — Telephone Encounter (Signed)
Patient was seen by Hilbert Odor

## 2020-01-15 NOTE — Progress Notes (Signed)
[facility] Progress Note   Subjective:   Patient seen and examined at bedside.  Reports "stuffiness in my nose because I cant use my nasal spray the way I usually do".  No other new complaints.    Objective Vitals:   01/14/20 1700 01/14/20 2032 01/15/20 0433 01/15/20 0946  BP: 137/81 136/73 (!) 104/53 (!) 117/58  Pulse: 69 67 (!) 57 (!) 57  Resp: 18 20 18 18   Temp: 98.9 F (37.2 C) 97.7 F (36.5 C) 98.7 F (37.1 C) 98.5 F (36.9 C)  TempSrc: Oral Oral Oral Oral  SpO2: 99% 99% 98% 100%  Weight:       Physical Exam General:chronically ill appearing male in NAD Heart:RRR, +6/3 systolic murmur Lungs:mostly CTAB Abdomen:soft, NTND Extremities:no LE edema, R knee tenderness/swelling Dialysis Access: R thigh [facility]   Filed Weights   01/09/20 1105 01/12/20 1025 01/12/20 1417  Weight: 120.4 kg 123 kg 120 kg    Intake/Output Summary (Last 24 hours) at 01/15/2020 1108 Last data filed at 01/14/2020 2200 Gross per 24 hour  Intake 600 ml  Output --  Net 600 ml    Additional Objective Labs: Basic Metabolic Panel: Recent Labs  Lab 01/10/20 0212 01/11/20 0407 01/12/20 0322 01/14/20 0823 01/14/20 0930 01/14/20 1359 01/14/20 2024 01/15/20 0337 01/15/20 0855  NA 136 134*   < > 134* 132*  --   --  135  --   K 4.5 5.8*   < > 5.4* 4.3   < > 5.0 5.2* 5.1  CL 95* 96*   < > 96* 92*  --   --  95*  --   CO2 25 25   < > 22 24  --   --  26  --   GLUCOSE 102* 97   < > 125* 99  --   --  134*  --   BUN 46* 77*   < > 101* 68*  --   --  76*  --   CREATININE 7.01* 9.40*   < > 9.59* 7.11*  --   --  8.05*  --   CALCIUM 9.0 9.4   < > 9.1 9.3  --   --  8.9  --   PHOS 2.9 2.8  --  3.8  --   --   --   --   --    < > = values in this interval not displayed.   Liver Function Tests: Recent Labs  Lab 01/10/20 0212 01/11/20 0407 01/14/20 0823  ALBUMIN 3.0* 2.9* 3.0*   CBC: Recent Labs  Lab 01/12/20 0322 01/13/20 0405 01/14/20 0308 01/14/20 0823 01/15/20 0337  WBC 7.5  9.0 9.0 10.2 7.1  HGB 8.9* 10.0* 9.1* 9.7* 8.8*  HCT 30.4* 34.0* 30.4* 33.1* 28.9*  MCV 95.9 97.7 95.6 96.2 94.4  PLT 191 212 168 189 173   CBG: Recent Labs  Lab 01/14/20 0420 01/14/20 0601  GLUCAP 111* 97    Medications:  . aspirin  325 mg Oral Daily  . calcium acetate  2,668 mg Oral TID WC  . Chlorhexidine Gluconate Cloth  6 each Topical Daily  . clopidogrel  75 mg Oral Daily  . escitalopram  5 mg Oral Daily  . famotidine  20 mg Oral QODAY  . feeding supplement (NEPRO CARB STEADY)  237 mL Oral TID WC  . heparin  5,000 Units Subcutaneous Q8H  . levothyroxine  137 mcg Oral QAC breakfast  . lidocaine  1 patch Transdermal Q24H  . rosuvastatin  10 mg Oral Daily  . traZODone  100 mg Oral QHS  . zolpidem  10 mg Oral QHS    Dialysis Orders: MWF at [facility] 4:45hr, 200 dialyzer, 350/800, EDW 117kg, 2K/2.25Ca, TDC, heparin 3700 bolus - Sensipar 30mg  PO q HD - Venofer 50mg  IV q week - Mircera 232mcg IV q 2 weeks (last 1/3)  Assessment/Plan: 1. Dysfunctional SWV:TVNRWCHJS to be an issue.Requiring repeat cath flow dwells.Pt has decided to hold off on [facility] exchange for now. No vascular surgery procedures anticipated at this time.  2. ESRD:Continue HD per MWF schedule. HDtomorrow per regular schedule. 3. Hyperkalemia- Usually takes kayexelate over weekends as OP. K5.2 this AM, getting lokelma.  Checking into whether we can get the powdered kayexalate.  Check labs pre HD and plan for low K bath if elevated. 4. Hypertension/volume:BP low/stable, close to EDW.UF as tolerated. 5. Anemia:Hgb 8.8- Recent ESA dose as outpatient, not due until 01/19/20 6. Metabolic bone disease:Ca/Phos ok - continue home bindersand sensipar. 7. Spina bifida 8. Chronic pain syndrome 9. R knee effusion: S/p joint aspiration 1/5.Repeat on 1/8 Cx pending.No evidence DVT on doppler. Suspected gout flare.Pain control remains an issue.Improving. 10. R foot pain- xray with no acute  findings. No evidence DVT on doppler. 11. Low-grade fever: BCx and synovial fluid Cx on 1/5 negative to date. Per primary.   Jen Mow, PA-C [facility] 01/15/2020,11:08 AM  LOS: 9 days

## 2020-01-15 NOTE — Progress Notes (Signed)
Subjective:   Patient complains today of a headache, which he attributes to not having his nasal spray until yesterday. It is improving now that he has it. Knee pain is improving, though he reports continued pain in the lower extremity.  Objective:  Vital signs in last 24 hours: Vitals:   01/14/20 1307 01/14/20 1700 01/14/20 2032 01/15/20 0433  BP: (!) 105/53 137/81 136/73 (!) 104/53  Pulse: 63 69 67 (!) 57  Resp: 18 18 20 18   Temp: 98.6 F (37 C) 98.9 F (37.2 C) 97.7 F (36.5 C) 98.7 F (37.1 C)  TempSrc: Oral Oral Oral Oral  SpO2: 100% 99% 99% 98%  Weight:        Physical Exam Constitutional: appears comfortable Head: atraumatic ENT: external ears normal Eyes: EOMI Cardiovascular: regular rate and rhythm, normal heart sounds Pulmonary: effort normal Abdominal: flat, ileostomy bag in place Musculoskeletal: R knee with a small effusion but no longer warm or tender Skin: warm and dry, dressing on L thigh over chronic wound from prior dialysis access, dressing to L flank at site of prior nephrostomy site Neurological: alert, no focal deficit Psychiatric: anxious  Assessment/Plan:  Principal Problem:   Clotted dialysis access [facility]) Active Problems:   ESRD (end stage renal disease) on dialysis ([place])   Hypothyroidism   Acute gout of right knee  [name] is a 54 y.o. male with hx of ESRD on MWF dialysis, HTN, CAD, severe aortic stenosis, spina bifida, chronic opioid use, L knee septic arthritris presenting with dialysis access issue and L knee pain. Has decreased but adequate flow currently. Has had recurrent fevers with negative infectious workup to date. Also treating for gout which is improved  Acute gout of R knee First arthrocentesis with white count 44,000 and urate crystals. Started prednisone 40 mg daily with gradual improvement.  Repeat joint aspiration again demonstrated a uric acid crystals and 7000 white blood cells. Also developed pain in R foot  which is tender. Xray negative. Likely referred pain from knee. Knee pain is improving now after oral and intraarticular steroids by ortho on 1/11.  Cultures have shown NGTD. -f/u arthrocentesis cultures -discontinue prednisone 40mg  daily -discontinue prn dilaudid, placed back on home Percocet q6hr prn -appreciate orthopedic surgery assistance  Recurrent fevers - resolved, negative infectious workup Most recent fever on 1/10 of 100.4.  Has had 3 fevers this admission.  No leukocytosis. Gout is a possible source, but he is at risk for an infection of his HD catheter. Has had negative blood cultures x2 and arthrocentesis cultures x2 to date. Has chronic wounds which do not appear infected.  -Wound care consult appreciated, signed off -Follow-up blood and arthrocentesis cultures, no growth to date -daily CBC  ESRD on MWF dialysis Impaired dialysis access Hyperkalemia Patient is well-known to the vascular surgery department and has had previous access to upper extremities and in the left lower extremity, now has a right femoral tunneled dialysis catheter. Presented for poor flow during dialysis. Has had adequate flow here after treating catheter with tPA. Vascular surgery has been following, and together with patient has decided to forego catheter exchange for now.  -vascular surgery signed off, appreciate assistance -nephro consulted, appreciate recs     -HD MWF -Continue home PhosLo -Daily BMP -Hyperkalemia improved after restricting diet  -Avoid nephrotoxins  Prior to Admission Living Arrangement: Home Anticipated Discharge Location: Home Barriers to Discharge: function limited by pain Dispo: tomorrow  [name], MD 01/15/2020, 6:33 AM Pager: [phone] After 5pm  on weekdays and 1pm on weekends: On Call pager [phone]

## 2020-01-15 NOTE — Progress Notes (Signed)
Patient spiked a fever and was given tylenol and MD was notified, RN will continue to monitor this patient.

## 2020-01-15 NOTE — Plan of Care (Signed)
  Problem: Education: Goal: Knowledge of General Education information will improve Description Including pain rating scale, medication(s)/side effects and non-pharmacologic comfort measures Outcome: Progressing   

## 2020-01-16 DIAGNOSIS — M25561 Pain in right knee: Secondary | ICD-10-CM | POA: Diagnosis not present

## 2020-01-16 DIAGNOSIS — G4700 Insomnia, unspecified: Secondary | ICD-10-CM | POA: Diagnosis not present

## 2020-01-16 DIAGNOSIS — T8249XA Other complication of vascular dialysis catheter, initial encounter: Secondary | ICD-10-CM | POA: Diagnosis not present

## 2020-01-16 DIAGNOSIS — E039 Hypothyroidism, unspecified: Secondary | ICD-10-CM | POA: Diagnosis not present

## 2020-01-16 LAB — RENAL FUNCTION PANEL
Albumin: 3.3 g/dL — ABNORMAL LOW (ref 3.5–5.0)
Anion gap: 10 (ref 5–15)
BUN: 10 mg/dL (ref 6–20)
CO2: 30 mmol/L (ref 22–32)
Calcium: 7.7 mg/dL — ABNORMAL LOW (ref 8.9–10.3)
Chloride: 106 mmol/L (ref 98–111)
Creatinine, Ser: 2.18 mg/dL — ABNORMAL HIGH (ref 0.61–1.24)
GFR, Estimated: 35 mL/min — ABNORMAL LOW (ref >60)
Glucose, Bld: 99 mg/dL (ref 70–99)
Phosphorus: <1 mg/dL — CL (ref 2.5–4.6)
Potassium: 2.2 mmol/L — CL (ref 3.5–5.1)
Sodium: 146 mmol/L — ABNORMAL HIGH (ref 135–145)

## 2020-01-16 LAB — BASIC METABOLIC PANEL
Anion gap: 16 — ABNORMAL HIGH (ref 5–15)
BUN: 114 mg/dL — ABNORMAL HIGH (ref 6–20)
CO2: 24 mmol/L (ref 22–32)
Calcium: 8.6 mg/dL — ABNORMAL LOW (ref 8.9–10.3)
Chloride: 94 mmol/L — ABNORMAL LOW (ref 98–111)
Creatinine, Ser: 10.08 mg/dL — ABNORMAL HIGH (ref 0.61–1.24)
GFR, Estimated: 6 mL/min — ABNORMAL LOW (ref >60)
Glucose, Bld: 80 mg/dL (ref 70–99)
Potassium: 5.7 mmol/L — ABNORMAL HIGH (ref 3.5–5.1)
Sodium: 134 mmol/L — ABNORMAL LOW (ref 135–145)

## 2020-01-16 LAB — CBC
HCT: 29 % — ABNORMAL LOW (ref 39.0–52.0)
Hemoglobin: 9 g/dL — ABNORMAL LOW (ref 13.0–17.0)
MCH: 29.4 pg (ref 26.0–34.0)
MCHC: 31 g/dL (ref 30.0–36.0)
MCV: 94.8 fL (ref 80.0–100.0)
Platelets: 146 10*3/uL — ABNORMAL LOW (ref 150–400)
RBC: 3.06 MIL/uL — ABNORMAL LOW (ref 4.22–5.81)
RDW: 18 % — ABNORMAL HIGH (ref 11.5–15.5)
WBC: 6.7 10*3/uL (ref 4.0–10.5)
nRBC: 0 % (ref 0.0–0.2)

## 2020-01-16 LAB — POTASSIUM
Potassium: 5.2 mmol/L — ABNORMAL HIGH (ref 3.5–5.1)
Potassium: 5.9 mmol/L — ABNORMAL HIGH (ref 3.5–5.1)

## 2020-01-16 MED ORDER — SODIUM CHLORIDE 0.9 % IV SOLN: 100.0000 mL | INTRAVENOUS | Status: DC | PRN

## 2020-01-16 MED ORDER — HEPARIN SODIUM (PORCINE) 1000 UNIT/ML DIALYSIS: 1000.0000 [IU] | INTRAMUSCULAR | Status: DC | PRN

## 2020-01-16 MED ORDER — ALTEPLASE 2 MG IJ SOLR
INTRAMUSCULAR | Status: AC
  Administered 2020-01-16: 2 mg
  Filled 2020-01-16: qty 6

## 2020-01-16 MED ORDER — PENTAFLUOROPROP-TETRAFLUOROETH EX AERO: 1.0000 "application " | INHALATION_SPRAY | CUTANEOUS | Status: DC | PRN

## 2020-01-16 MED ORDER — ALTEPLASE 2 MG IJ SOLR: 2.0000 mg | Freq: Once | INTRAMUSCULAR | Status: AC | PRN

## 2020-01-16 MED ORDER — FAMOTIDINE 20 MG PO TABS: 20.0000 mg | ORAL_TABLET | Freq: Every day | ORAL | 0 refills | Status: DC

## 2020-01-16 MED ORDER — HEPARIN SODIUM (PORCINE) 1000 UNIT/ML DIALYSIS: 3700.0000 [IU] | INTRAMUSCULAR | Status: DC | PRN

## 2020-01-16 MED ORDER — PROMETHAZINE HCL 25 MG/ML IJ SOLN: 12.5000 mg | Freq: Once | INTRAMUSCULAR | Status: AC

## 2020-01-16 MED ORDER — PROMETHAZINE HCL 25 MG/ML IJ SOLN
INTRAMUSCULAR | Status: AC
  Administered 2020-01-16: 12.5 mg via INTRAVENOUS
  Filled 2020-01-16: qty 1

## 2020-01-16 MED ORDER — LIDOCAINE-PRILOCAINE 2.5-2.5 % EX CREA: 1.0000 "application " | TOPICAL_CREAM | CUTANEOUS | Status: DC | PRN

## 2020-01-16 MED ORDER — LIDOCAINE HCL (PF) 1 % IJ SOLN: 5.0000 mL | INTRAMUSCULAR | Status: DC | PRN

## 2020-01-16 NOTE — Progress Notes (Signed)
[facility] Progress Note   Subjective:   Patient seen and examined at bedside during dialysis.  Reports increased pain, heartburn and nausea today.  Order written for phenergan 12.5mg  IV x1 during HD.      Objective Vitals:   01/16/20 1012 01/16/20 1017 01/16/20 1030 01/16/20 1100  BP: 136/76 131/63 (!) 114/57 (!) 109/57  Pulse: 60 60 60 (!) 58  Resp: 16     Temp: 98.2 F (36.8 C)     TempSrc: Oral     SpO2: 97%     Weight:       Physical Exam General:chronically ill appearing male in NAD Heart:RRR, +7/8 systolic murmur Lungs:CTAB, nml WOB Abdomen:soft, ND Extremities:no LE edema Dialysis Access: L thigh TDC in use   Filed Weights   01/12/20 1025 01/12/20 1417 01/15/20 2115  Weight: 123 kg 120 kg 123 kg    Intake/Output Summary (Last 24 hours) at 01/16/2020 1113 Last data filed at 01/16/2020 0851 Gross per 24 hour  Intake 1650 ml  Output -  Net 1650 ml    Additional Objective Labs: Basic Metabolic Panel: Recent Labs  Lab 01/10/20 0212 01/11/20 0407 01/12/20 0322 01/14/20 0823 01/14/20 0930 01/14/20 1359 01/15/20 0337 01/15/20 0855 01/15/20 2057 01/16/20 0605 01/16/20 0859  NA 136 134*   < > 134* 132*  --  135  --   --  134*  --   K 4.5 5.8*   < > 5.4* 4.3   < > 5.2*   < > 5.2* 5.7* 5.9*  CL 95* 96*   < > 96* 92*  --  95*  --   --  94*  --   CO2 25 25   < > 22 24  --  26  --   --  24  --   GLUCOSE 102* 97   < > 125* 99  --  134*  --   --  80  --   BUN 46* 77*   < > 101* 68*  --  76*  --   --  114*  --   CREATININE 7.01* 9.40*   < > 9.59* 7.11*  --  8.05*  --   --  10.08*  --   CALCIUM 9.0 9.4   < > 9.1 9.3  --  8.9  --   --  8.6*  --   PHOS 2.9 2.8  --  3.8  --   --   --   --   --   --   --    < > = values in this interval not displayed.   Liver Function Tests: Recent Labs  Lab 01/10/20 0212 01/11/20 0407 01/14/20 0823  ALBUMIN 3.0* 2.9* 3.0*   CBC: Recent Labs  Lab 01/13/20 0405 01/14/20 0308 01/14/20 0823 01/15/20 0337  01/16/20 0605  WBC 9.0 9.0 10.2 7.1 6.7  HGB 10.0* 9.1* 9.7* 8.8* 9.0*  HCT 34.0* 30.4* 33.1* 28.9* 29.0*  MCV 97.7 95.6 96.2 94.4 94.8  PLT 212 168 189 173 146*   CBG: Recent Labs  Lab 01/14/20 0420 01/14/20 0601  GLUCAP 111* 97    Medications: . sodium chloride    . sodium chloride     . promethazine      . aspirin  325 mg Oral Daily  . calcium acetate  2,668 mg Oral TID WC  . Chlorhexidine Gluconate Cloth  6 each Topical Daily  . clopidogrel  75 mg Oral Daily  . escitalopram  5 mg  Oral Daily  . famotidine  20 mg Oral QODAY  . feeding supplement (NEPRO CARB STEADY)  237 mL Oral TID WC  . heparin  5,000 Units Subcutaneous Q8H  . levothyroxine  137 mcg Oral QAC breakfast  . lidocaine  1 patch Transdermal Q24H  . promethazine  12.5 mg Intravenous Once in dialysis  . rosuvastatin  10 mg Oral Daily  . traZODone  100 mg Oral QHS  . zolpidem  10 mg Oral QHS    Dialysis Orders: MWF at [facility] 4:45hr, 200 dialyzer, 350/800, EDW 117kg, 2K/2.25Ca, TDC, heparin 3700 bolus - Sensipar 30mg  PO q HD - Venofer 50mg  IV q week - Mircera 241mcg IV q 2 weeks (last 1/3)  Assessment/Plan: 1. Dysfunctional GGE:ZMOQHUTML to be an issue.Requiring repeat cath flow dwells.Pt has decided to hold off on [facility] exchange for now. No vascular surgery procedures anticipated at this time.  2. ESRD:Continue HD per MWF schedule. HDtodayper regular schedule. 3. Hyperkalemia- Usually takes kayexelate over weekends as OP. K5.2this AM, getting lokelma. Checking into whether we can get the powdered kayexalate.  Check labs pre HD and plan for low K bath if elevated. K 5.9 today. 4. Hypertension/volume:BP low/stable, close to EDW.UF as tolerated. 5. Anemia:Hgb9.0- Recent ESA dose as outpatient, not due until 01/19/20 6. Metabolic bone disease:Ca/Phos ok - continue home bindersand sensipar. 7. Spina bifida 8. Chronic pain syndrome 9. R knee effusion: S/p joint aspiration 1/5.Repeat on  1/8 Cx pending.No evidence DVT on doppler. Suspected gout flare.Pain control remains an issue.Improving. 10. R foot pain- xray with no acute findings. No evidence DVT on doppler. 11. Low-grade fever: BCx and synovial fluid Cx on 1/5 negative to date. Per primary.    Jen Mow, PA-C [facility] 01/16/2020,11:13 AM  LOS: 10 days

## 2020-01-16 NOTE — Progress Notes (Signed)
CRITICAL VALUE ALERT  Critical Value:k-2.2 and phosophorus <1  Date & Time Notied:  6283. 01/16/20  Provider Notified: Bridgett Larsson, md  Orders Received/Actions taken: awaiting further orders; spoke with provider on unit. No further orders given. Will continue to monitor pt once back from dialysis

## 2020-01-16 NOTE — Progress Notes (Signed)
Subjective:   This morning, patient reports that he feels terrible. He reports that he was unable to sleep last night. He endorses ongoing nausea, night sweats and knee pain. We informed him that there is no more indication for hospitalization, and he states that he may appeal the discharge home following dialysis today depending on how he feels. He has no further questions or concerns.  Objective:  Vital signs in last 24 hours: Vitals:   01/15/20 1811 01/15/20 1848 01/15/20 2115 01/16/20 0551  BP: (!) 108/47  134/76 (!) 93/52  Pulse: 71  70 (!) 59  Resp: 18     Temp: 100.2 F (37.9 C) 98.9 F (37.2 C) 98.8 F (37.1 C)   TempSrc: Oral  Oral   SpO2: 95%  98% 100%  Weight:   123 kg     Physical Exam Constitutional: appears comfortable Head: atraumatic ENT: external ears normal Eyes: EOMI Cardiovascular: regular rate and rhythm, normal heart sounds Pulmonary: effort normal Abdominal: flat, ileostomy bag in place Musculoskeletal: R knee with a small effusion but no longer warm or tender Skin: warm and dry, dressing on L thigh over chronic wound from prior dialysis access, dressing to L flank at site of prior nephrostomy site Neurological: alert, no focal deficit Psychiatric: anxious  Assessment/Plan:  Principal Problem:   Clotted dialysis access [facility]) Active Problems:   ESRD (end stage renal disease) on dialysis ([place])   Hypothyroidism   Acute gout of right knee  [name] is a 55 y.o. male with hx of ESRD on MWF dialysis, HTN, CAD, severe aortic stenosis, spina bifida, chronic opioid use, L knee septic arthritris presenting with dialysis access issue and L knee pain. Has decreased but adequate flow currently. Has had recurrent fevers with negative infectious workup to date. Also treating for gout which is improved  Acute gout of R knee First arthrocentesis with white count 44,000 and urate crystals. Started prednisone 40 mg daily with gradual improvement.  Repeat  joint aspiration again demonstrated a uric acid crystals and 7000 white blood cells. Also developed pain in R foot which is tender. Xray negative. Likely referred pain from knee. Knee pain is improving now after oral and intraarticular steroids by ortho on 1/11.  Cultures have shown NGTD. -f/u arthrocentesis cultures -continue home Percocet q6hr prn -appreciate orthopedic surgery assistance  Recurrent fevers with negative infectious workup Most recent fever on 1/14 of 100.2.  Has had 4 fevers this admission.  No leukocytosis. Gout is a possible source, but we are concerned for infection of HD catheter or septic arthritis. At this point we have ruled out infection. Has had negative blood cultures x2 and arthrocentesis cultures x2 to date. Has chronic wounds which do not appear infected.  -Wound care consult appreciated, signed off -Follow-up blood and arthrocentesis cultures, no growth to date -daily CBC  ESRD on MWF dialysis Impaired dialysis access Hyperkalemia Patient is well-known to the vascular surgery department and has had previous access to upper extremities and in the left lower extremity, now has a right femoral tunneled dialysis catheter. Presented for poor flow during dialysis. Has had adequate flow here after treating catheter with tPA. Vascular surgery has been following, and together with patient has decided to forego catheter exchange for now.  -vascular surgery signed off, appreciate assistance -nephro consulted, appreciate recs     -HD MWF -Continue home PhosLo -Daily BMP -Hyperkalemia improved after restricting diet  -Avoid nephrotoxins  Prior to Admission Living Arrangement: Home Anticipated Discharge Location: Home  Barriers to Discharge: none Dispo: medically stable for discharge. Patient wants to appeal discharge and we have informed him that there is no more medical reason for hospitalization  [name], MD 01/16/2020, 6:40 AM Pager: [phone] After 5pm on  weekdays and 1pm on weekends: On Call pager [phone]

## 2020-01-16 NOTE — Plan of Care (Signed)
?  Problem: Clinical Measurements: ?Goal: Will remain free from infection ?Outcome: Progressing ?  ?

## 2020-01-17 LAB — CULTURE, BLOOD (ROUTINE X 2)
Culture: NO GROWTH
Culture: NO GROWTH

## 2020-01-17 NOTE — Progress Notes (Signed)
Discharge Note:  Patient has no PIV.  He does have his femoral hemodialysis catheter.  Dressing is CDI.  Patient is well versed in the care of this catheter.  He no longer has telemetry.  He is assisted with dressing and able to transfer from bed to wheelchair with minimal assistance.  He is c/o of burning in mid epigastric area and requests pain medication.  2 percocet PO given with apple juice.  Patient has all belonging including cell phone, charger, clothing, etc.  Room searched and no other items found.  Discharge instructions reviewed and patient had no further questions.  Patient discharged into care of person picking him up at front valet entrance.  Earleen Reaper RN

## 2020-01-19 ENCOUNTER — Ambulatory Visit: Payer: Medicare Other

## 2020-01-20 DIAGNOSIS — M79671 Pain in right foot: Secondary | ICD-10-CM | POA: Insufficient documentation

## 2020-01-20 DIAGNOSIS — Q059 Spina bifida, unspecified: Secondary | ICD-10-CM | POA: Insufficient documentation

## 2020-01-25 ENCOUNTER — Other Ambulatory Visit: Payer: Self-pay | Admitting: Student

## 2020-01-27 ENCOUNTER — Emergency Department (HOSPITAL_COMMUNITY): Payer: Medicare Other

## 2020-01-27 ENCOUNTER — Encounter (HOSPITAL_COMMUNITY): Payer: Self-pay | Admitting: Emergency Medicine

## 2020-01-27 ENCOUNTER — Inpatient Hospital Stay (HOSPITAL_COMMUNITY)
Admission: EM | Admit: 2020-01-27 | Discharge: 2020-02-21 | DRG: 263 | Disposition: A | Discharge status: Home Health | Payer: Medicare Other | Attending: Internal Medicine | Admitting: Internal Medicine

## 2020-01-27 ENCOUNTER — Other Ambulatory Visit: Payer: Self-pay

## 2020-01-27 DIAGNOSIS — I350 Nonrheumatic aortic (valve) stenosis: Secondary | ICD-10-CM | POA: Diagnosis present

## 2020-01-27 DIAGNOSIS — E559 Vitamin D deficiency, unspecified: Secondary | ICD-10-CM | POA: Diagnosis present

## 2020-01-27 DIAGNOSIS — M10311 Gout due to renal impairment, right shoulder: Secondary | ICD-10-CM

## 2020-01-27 DIAGNOSIS — N319 Neuromuscular dysfunction of bladder, unspecified: Secondary | ICD-10-CM | POA: Diagnosis present

## 2020-01-27 DIAGNOSIS — D62 Acute posthemorrhagic anemia: Secondary | ICD-10-CM | POA: Diagnosis not present

## 2020-01-27 DIAGNOSIS — Z87442 Personal history of urinary calculi: Secondary | ICD-10-CM

## 2020-01-27 DIAGNOSIS — Z7982 Long term (current) use of aspirin: Secondary | ICD-10-CM

## 2020-01-27 DIAGNOSIS — R0789 Other chest pain: Secondary | ICD-10-CM | POA: Diagnosis present

## 2020-01-27 DIAGNOSIS — T829XXA Unspecified complication of cardiac and vascular prosthetic device, implant and graft, initial encounter: Secondary | ICD-10-CM

## 2020-01-27 DIAGNOSIS — Z801 Family history of malignant neoplasm of trachea, bronchus and lung: Secondary | ICD-10-CM

## 2020-01-27 DIAGNOSIS — M25011 Hemarthrosis, right shoulder: Secondary | ICD-10-CM | POA: Diagnosis present

## 2020-01-27 DIAGNOSIS — R238 Other skin changes: Secondary | ICD-10-CM

## 2020-01-27 DIAGNOSIS — R21 Rash and other nonspecific skin eruption: Secondary | ICD-10-CM | POA: Diagnosis present

## 2020-01-27 DIAGNOSIS — Z833 Family history of diabetes mellitus: Secondary | ICD-10-CM

## 2020-01-27 DIAGNOSIS — M109 Gout, unspecified: Secondary | ICD-10-CM | POA: Diagnosis present

## 2020-01-27 DIAGNOSIS — I120 Hypertensive chronic kidney disease with stage 5 chronic kidney disease or end stage renal disease: Secondary | ICD-10-CM | POA: Diagnosis present

## 2020-01-27 DIAGNOSIS — E669 Obesity, unspecified: Secondary | ICD-10-CM | POA: Diagnosis present

## 2020-01-27 DIAGNOSIS — Z6839 Body mass index (BMI) 39.0-39.9, adult: Secondary | ICD-10-CM

## 2020-01-27 DIAGNOSIS — M7989 Other specified soft tissue disorders: Secondary | ICD-10-CM | POA: Diagnosis present

## 2020-01-27 DIAGNOSIS — N186 End stage renal disease: Secondary | ICD-10-CM | POA: Diagnosis present

## 2020-01-27 DIAGNOSIS — Z8249 Family history of ischemic heart disease and other diseases of the circulatory system: Secondary | ICD-10-CM

## 2020-01-27 DIAGNOSIS — I70201 Unspecified atherosclerosis of native arteries of extremities, right leg: Secondary | ICD-10-CM | POA: Diagnosis present

## 2020-01-27 DIAGNOSIS — L988 Other specified disorders of the skin and subcutaneous tissue: Secondary | ICD-10-CM | POA: Diagnosis present

## 2020-01-27 DIAGNOSIS — Z79899 Other long term (current) drug therapy: Secondary | ICD-10-CM

## 2020-01-27 DIAGNOSIS — N2581 Secondary hyperparathyroidism of renal origin: Secondary | ICD-10-CM | POA: Diagnosis present

## 2020-01-27 DIAGNOSIS — I708 Atherosclerosis of other arteries: Secondary | ICD-10-CM | POA: Diagnosis present

## 2020-01-27 DIAGNOSIS — Z885 Allergy status to narcotic agent status: Secondary | ICD-10-CM

## 2020-01-27 DIAGNOSIS — Z7902 Long term (current) use of antithrombotics/antiplatelets: Secondary | ICD-10-CM

## 2020-01-27 DIAGNOSIS — E039 Hypothyroidism, unspecified: Secondary | ICD-10-CM | POA: Diagnosis present

## 2020-01-27 DIAGNOSIS — F419 Anxiety disorder, unspecified: Secondary | ICD-10-CM | POA: Diagnosis present

## 2020-01-27 DIAGNOSIS — T8241XA Breakdown (mechanical) of vascular dialysis catheter, initial encounter: Secondary | ICD-10-CM | POA: Diagnosis present

## 2020-01-27 DIAGNOSIS — Y828 Other medical devices associated with adverse incidents: Secondary | ICD-10-CM | POA: Diagnosis present

## 2020-01-27 DIAGNOSIS — Z79891 Long term (current) use of opiate analgesic: Secondary | ICD-10-CM

## 2020-01-27 DIAGNOSIS — I739 Peripheral vascular disease, unspecified: Secondary | ICD-10-CM | POA: Diagnosis present

## 2020-01-27 DIAGNOSIS — Z7189 Other specified counseling: Secondary | ICD-10-CM

## 2020-01-27 DIAGNOSIS — R040 Epistaxis: Secondary | ICD-10-CM | POA: Diagnosis present

## 2020-01-27 DIAGNOSIS — M75100 Unspecified rotator cuff tear or rupture of unspecified shoulder, not specified as traumatic: Secondary | ICD-10-CM | POA: Diagnosis present

## 2020-01-27 DIAGNOSIS — R001 Bradycardia, unspecified: Secondary | ICD-10-CM | POA: Diagnosis not present

## 2020-01-27 DIAGNOSIS — N99528 Other complication of other external stoma of urinary tract: Secondary | ICD-10-CM

## 2020-01-27 DIAGNOSIS — K219 Gastro-esophageal reflux disease without esophagitis: Secondary | ICD-10-CM | POA: Diagnosis present

## 2020-01-27 DIAGNOSIS — T827XXA Infection and inflammatory reaction due to other cardiac and vascular devices, implants and grafts, initial encounter: Secondary | ICD-10-CM | POA: Diagnosis not present

## 2020-01-27 DIAGNOSIS — Y832 Surgical operation with anastomosis, bypass or graft as the cause of abnormal reaction of the patient, or of later complication, without mention of misadventure at the time of the procedure: Secondary | ICD-10-CM | POA: Diagnosis present

## 2020-01-27 DIAGNOSIS — G894 Chronic pain syndrome: Secondary | ICD-10-CM | POA: Diagnosis present

## 2020-01-27 DIAGNOSIS — L02214 Cutaneous abscess of groin: Secondary | ICD-10-CM | POA: Diagnosis present

## 2020-01-27 DIAGNOSIS — Z95820 Peripheral vascular angioplasty status with implants and grafts: Secondary | ICD-10-CM

## 2020-01-27 DIAGNOSIS — I2510 Atherosclerotic heart disease of native coronary artery without angina pectoris: Secondary | ICD-10-CM | POA: Diagnosis present

## 2020-01-27 DIAGNOSIS — D696 Thrombocytopenia, unspecified: Secondary | ICD-10-CM | POA: Diagnosis present

## 2020-01-27 DIAGNOSIS — Q059 Spina bifida, unspecified: Secondary | ICD-10-CM

## 2020-01-27 DIAGNOSIS — Z79890 Hormone replacement therapy (postmenopausal): Secondary | ICD-10-CM

## 2020-01-27 DIAGNOSIS — Z515 Encounter for palliative care: Secondary | ICD-10-CM

## 2020-01-27 DIAGNOSIS — Z888 Allergy status to other drugs, medicaments and biological substances status: Secondary | ICD-10-CM

## 2020-01-27 DIAGNOSIS — D631 Anemia in chronic kidney disease: Secondary | ICD-10-CM | POA: Diagnosis present

## 2020-01-27 DIAGNOSIS — L02416 Cutaneous abscess of left lower limb: Secondary | ICD-10-CM | POA: Diagnosis present

## 2020-01-27 DIAGNOSIS — D691 Qualitative platelet defects: Secondary | ICD-10-CM

## 2020-01-27 DIAGNOSIS — Z800 Family history of malignant neoplasm of digestive organs: Secondary | ICD-10-CM

## 2020-01-27 DIAGNOSIS — Z992 Dependence on renal dialysis: Secondary | ICD-10-CM

## 2020-01-27 DIAGNOSIS — M25411 Effusion, right shoulder: Secondary | ICD-10-CM | POA: Diagnosis present

## 2020-01-27 DIAGNOSIS — R7982 Elevated C-reactive protein (CRP): Secondary | ICD-10-CM | POA: Diagnosis present

## 2020-01-27 DIAGNOSIS — L02414 Cutaneous abscess of left upper limb: Secondary | ICD-10-CM | POA: Insufficient documentation

## 2020-01-27 DIAGNOSIS — K5900 Constipation, unspecified: Secondary | ICD-10-CM | POA: Diagnosis present

## 2020-01-27 DIAGNOSIS — R233 Spontaneous ecchymoses: Secondary | ICD-10-CM

## 2020-01-27 DIAGNOSIS — E8889 Other specified metabolic disorders: Secondary | ICD-10-CM | POA: Diagnosis present

## 2020-01-27 DIAGNOSIS — Z882 Allergy status to sulfonamides status: Secondary | ICD-10-CM

## 2020-01-27 DIAGNOSIS — Z20822 Contact with and (suspected) exposure to covid-19: Secondary | ICD-10-CM | POA: Diagnosis present

## 2020-01-27 DIAGNOSIS — I953 Hypotension of hemodialysis: Secondary | ICD-10-CM | POA: Diagnosis not present

## 2020-01-27 DIAGNOSIS — E875 Hyperkalemia: Secondary | ICD-10-CM | POA: Diagnosis present

## 2020-01-27 DIAGNOSIS — I878 Other specified disorders of veins: Secondary | ICD-10-CM | POA: Diagnosis present

## 2020-01-27 LAB — CBC
HCT: 28.6 % — ABNORMAL LOW (ref 39.0–52.0)
Hemoglobin: 8.3 g/dL — ABNORMAL LOW (ref 13.0–17.0)
MCH: 28.3 pg (ref 26.0–34.0)
MCHC: 29 g/dL — ABNORMAL LOW (ref 30.0–36.0)
MCV: 97.6 fL (ref 80.0–100.0)
Platelets: UNDETERMINED 10*3/uL (ref 150–400)
RBC: 2.93 MIL/uL — ABNORMAL LOW (ref 4.22–5.81)
RDW: 17.9 % — ABNORMAL HIGH (ref 11.5–15.5)
WBC: 6.8 10*3/uL (ref 4.0–10.5)
nRBC: 0 % (ref 0.0–0.2)

## 2020-01-27 LAB — BASIC METABOLIC PANEL
Anion gap: 16 — ABNORMAL HIGH (ref 5–15)
BUN: 64 mg/dL — ABNORMAL HIGH (ref 6–20)
CO2: 28 mmol/L (ref 22–32)
Calcium: 8.3 mg/dL — ABNORMAL LOW (ref 8.9–10.3)
Chloride: 94 mmol/L — ABNORMAL LOW (ref 98–111)
Creatinine, Ser: 10.35 mg/dL — ABNORMAL HIGH (ref 0.61–1.24)
GFR, Estimated: 5 mL/min — ABNORMAL LOW (ref >60)
Glucose, Bld: 86 mg/dL (ref 70–99)
Potassium: 5.7 mmol/L — ABNORMAL HIGH (ref 3.5–5.1)
Sodium: 138 mmol/L (ref 135–145)

## 2020-01-27 LAB — TROPONIN I (HIGH SENSITIVITY)
Troponin I (High Sensitivity): 23 ng/L — ABNORMAL HIGH (ref <18)
Troponin I (High Sensitivity): 28 ng/L — ABNORMAL HIGH (ref <18)

## 2020-01-27 NOTE — ED Triage Notes (Addendum)
Pt arrives via EMS for nose bleed that has been ongoing for 12 hours. Pt also complains of a small, abscess to fistula in left groin. This just appeared yesterday. Pt did not receive full dialysis on Monday. His working dialysis port is in right thigh. Pt also complains of chest pain that developed today. Pt is alert and oriented.

## 2020-01-28 ENCOUNTER — Inpatient Hospital Stay (HOSPITAL_COMMUNITY): Payer: Medicare Other

## 2020-01-28 DIAGNOSIS — Y832 Surgical operation with anastomosis, bypass or graft as the cause of abnormal reaction of the patient, or of later complication, without mention of misadventure at the time of the procedure: Secondary | ICD-10-CM | POA: Diagnosis present

## 2020-01-28 DIAGNOSIS — M25411 Effusion, right shoulder: Secondary | ICD-10-CM | POA: Diagnosis not present

## 2020-01-28 DIAGNOSIS — M109 Gout, unspecified: Secondary | ICD-10-CM | POA: Diagnosis present

## 2020-01-28 DIAGNOSIS — E8889 Other specified metabolic disorders: Secondary | ICD-10-CM | POA: Diagnosis present

## 2020-01-28 DIAGNOSIS — T82898A Other specified complication of vascular prosthetic devices, implants and grafts, initial encounter: Secondary | ICD-10-CM | POA: Diagnosis not present

## 2020-01-28 DIAGNOSIS — M10312 Gout due to renal impairment, left shoulder: Secondary | ICD-10-CM | POA: Diagnosis not present

## 2020-01-28 DIAGNOSIS — T829XXD Unspecified complication of cardiac and vascular prosthetic device, implant and graft, subsequent encounter: Secondary | ICD-10-CM | POA: Diagnosis not present

## 2020-01-28 DIAGNOSIS — I120 Hypertensive chronic kidney disease with stage 5 chronic kidney disease or end stage renal disease: Secondary | ICD-10-CM | POA: Diagnosis present

## 2020-01-28 DIAGNOSIS — M25011 Hemarthrosis, right shoulder: Secondary | ICD-10-CM | POA: Diagnosis present

## 2020-01-28 DIAGNOSIS — T8241XA Breakdown (mechanical) of vascular dialysis catheter, initial encounter: Secondary | ICD-10-CM | POA: Diagnosis present

## 2020-01-28 DIAGNOSIS — L02214 Cutaneous abscess of groin: Secondary | ICD-10-CM | POA: Diagnosis present

## 2020-01-28 DIAGNOSIS — I350 Nonrheumatic aortic (valve) stenosis: Secondary | ICD-10-CM | POA: Diagnosis present

## 2020-01-28 DIAGNOSIS — Z95820 Peripheral vascular angioplasty status with implants and grafts: Secondary | ICD-10-CM | POA: Diagnosis not present

## 2020-01-28 DIAGNOSIS — I953 Hypotension of hemodialysis: Secondary | ICD-10-CM | POA: Diagnosis not present

## 2020-01-28 DIAGNOSIS — D62 Acute posthemorrhagic anemia: Secondary | ICD-10-CM | POA: Diagnosis not present

## 2020-01-28 DIAGNOSIS — T827XXA Infection and inflammatory reaction due to other cardiac and vascular devices, implants and grafts, initial encounter: Secondary | ICD-10-CM | POA: Diagnosis present

## 2020-01-28 DIAGNOSIS — I739 Peripheral vascular disease, unspecified: Secondary | ICD-10-CM | POA: Diagnosis not present

## 2020-01-28 DIAGNOSIS — I708 Atherosclerosis of other arteries: Secondary | ICD-10-CM | POA: Diagnosis present

## 2020-01-28 DIAGNOSIS — N2581 Secondary hyperparathyroidism of renal origin: Secondary | ICD-10-CM | POA: Diagnosis present

## 2020-01-28 DIAGNOSIS — N186 End stage renal disease: Secondary | ICD-10-CM | POA: Diagnosis present

## 2020-01-28 DIAGNOSIS — L02416 Cutaneous abscess of left lower limb: Secondary | ICD-10-CM | POA: Diagnosis present

## 2020-01-28 DIAGNOSIS — Z992 Dependence on renal dialysis: Secondary | ICD-10-CM | POA: Diagnosis not present

## 2020-01-28 DIAGNOSIS — D631 Anemia in chronic kidney disease: Secondary | ICD-10-CM | POA: Diagnosis present

## 2020-01-28 DIAGNOSIS — Z7189 Other specified counseling: Secondary | ICD-10-CM | POA: Diagnosis not present

## 2020-01-28 DIAGNOSIS — R040 Epistaxis: Secondary | ICD-10-CM | POA: Diagnosis present

## 2020-01-28 DIAGNOSIS — Z20822 Contact with and (suspected) exposure to covid-19: Secondary | ICD-10-CM | POA: Diagnosis present

## 2020-01-28 DIAGNOSIS — D691 Qualitative platelet defects: Secondary | ICD-10-CM | POA: Diagnosis present

## 2020-01-28 DIAGNOSIS — D696 Thrombocytopenia, unspecified: Secondary | ICD-10-CM | POA: Diagnosis present

## 2020-01-28 DIAGNOSIS — K219 Gastro-esophageal reflux disease without esophagitis: Secondary | ICD-10-CM | POA: Diagnosis present

## 2020-01-28 DIAGNOSIS — M10311 Gout due to renal impairment, right shoulder: Secondary | ICD-10-CM | POA: Diagnosis not present

## 2020-01-28 DIAGNOSIS — Y828 Other medical devices associated with adverse incidents: Secondary | ICD-10-CM | POA: Diagnosis present

## 2020-01-28 DIAGNOSIS — E875 Hyperkalemia: Secondary | ICD-10-CM | POA: Diagnosis present

## 2020-01-28 DIAGNOSIS — Z515 Encounter for palliative care: Secondary | ICD-10-CM | POA: Diagnosis not present

## 2020-01-28 DIAGNOSIS — G894 Chronic pain syndrome: Secondary | ICD-10-CM | POA: Diagnosis present

## 2020-01-28 LAB — SEDIMENTATION RATE: Sed Rate: 27 mm/hr — ABNORMAL HIGH (ref 0–16)

## 2020-01-28 LAB — CBC WITH DIFFERENTIAL/PLATELET
Abs Immature Granulocytes: 0.02 K/uL (ref 0.00–0.07)
Basophils Absolute: 0 K/uL (ref 0.0–0.1)
Basophils Relative: 0 %
Eosinophils Absolute: 0.2 K/uL (ref 0.0–0.5)
Eosinophils Relative: 3 %
HCT: 26.4 % — ABNORMAL LOW (ref 39.0–52.0)
Hemoglobin: 7.9 g/dL — ABNORMAL LOW (ref 13.0–17.0)
Immature Granulocytes: 0 %
Lymphocytes Relative: 9 %
Lymphs Abs: 0.5 K/uL — ABNORMAL LOW (ref 0.7–4.0)
MCH: 28.7 pg (ref 26.0–34.0)
MCHC: 29.9 g/dL — ABNORMAL LOW (ref 30.0–36.0)
MCV: 96 fL (ref 80.0–100.0)
Monocytes Absolute: 0.4 K/uL (ref 0.1–1.0)
Monocytes Relative: 7 %
Neutro Abs: 4.9 K/uL (ref 1.7–7.7)
Neutrophils Relative %: 81 %
Platelets: 109 K/uL — ABNORMAL LOW (ref 150–400)
RBC: 2.75 MIL/uL — ABNORMAL LOW (ref 4.22–5.81)
RDW: 17.9 % — ABNORMAL HIGH (ref 11.5–15.5)
WBC: 6 K/uL (ref 4.0–10.5)
nRBC: 0 % (ref 0.0–0.2)

## 2020-01-28 LAB — PROTIME-INR
INR: 1.1 (ref 0.8–1.2)
Prothrombin Time: 14.2 seconds (ref 11.4–15.2)

## 2020-01-28 LAB — CBG MONITORING, ED: Glucose-Capillary: 70 mg/dL (ref 70–99)

## 2020-01-28 LAB — RENAL FUNCTION PANEL
Albumin: 3 g/dL — ABNORMAL LOW (ref 3.5–5.0)
Anion gap: 16 — ABNORMAL HIGH (ref 5–15)
BUN: 74 mg/dL — ABNORMAL HIGH (ref 6–20)
CO2: 28 mmol/L (ref 22–32)
Calcium: 8.4 mg/dL — ABNORMAL LOW (ref 8.9–10.3)
Chloride: 93 mmol/L — ABNORMAL LOW (ref 98–111)
Creatinine, Ser: 11.49 mg/dL — ABNORMAL HIGH (ref 0.61–1.24)
GFR, Estimated: 5 mL/min — ABNORMAL LOW (ref >60)
Glucose, Bld: 82 mg/dL (ref 70–99)
Phosphorus: 5.2 mg/dL — ABNORMAL HIGH (ref 2.5–4.6)
Potassium: 5.5 mmol/L — ABNORMAL HIGH (ref 3.5–5.1)
Sodium: 137 mmol/L (ref 135–145)

## 2020-01-28 LAB — SARS CORONAVIRUS 2 (TAT 6-24 HRS): SARS Coronavirus 2: NEGATIVE

## 2020-01-28 LAB — SAVE SMEAR(SSMR), FOR PROVIDER SLIDE REVIEW

## 2020-01-28 LAB — C-REACTIVE PROTEIN: CRP: 9.5 mg/dL — ABNORMAL HIGH (ref <1.0)

## 2020-01-28 LAB — APTT: aPTT: 37 s — ABNORMAL HIGH (ref 24–36)

## 2020-01-28 MED ORDER — FLUTICASONE PROPIONATE 50 MCG/ACT NA SUSP
2.0000 | NASAL | Status: DC | PRN
  Administered 2020-02-07 – 2020-02-13 (×4): 2 via NASAL
  Filled 2020-01-28 (×3): qty 16

## 2020-01-28 MED ORDER — SODIUM CHLORIDE 0.9 % IV SOLN
2.0000 g | Freq: Once | INTRAVENOUS | Status: AC
  Administered 2020-01-28: 2 g via INTRAVENOUS
  Filled 2020-01-28: qty 2

## 2020-01-28 MED ORDER — TRANEXAMIC ACID FOR EPISTAXIS
500.0000 mg | Freq: Once | TOPICAL | Status: AC
  Administered 2020-01-28: 500 mg via TOPICAL
  Filled 2020-01-28: qty 10

## 2020-01-28 MED ORDER — VANCOMYCIN HCL 10 G IV SOLR
2500.0000 mg | Freq: Once | INTRAVENOUS | Status: AC
  Administered 2020-01-28: 2500 mg via INTRAVENOUS
  Filled 2020-01-28: qty 2500

## 2020-01-28 MED ORDER — FAMOTIDINE 20 MG PO TABS
40.0000 mg | ORAL_TABLET | Freq: Every day | ORAL | Status: DC
Start: 2020-01-28 — End: 2020-02-21
  Administered 2020-01-28 – 2020-02-21 (×23): 40 mg via ORAL
  Filled 2020-01-28 (×24): qty 2

## 2020-01-28 MED ORDER — SODIUM CHLORIDE 0.9 % IV SOLN: 250.0000 mL | INTRAVENOUS | Status: DC | PRN

## 2020-01-28 MED ORDER — CHLORHEXIDINE GLUCONATE CLOTH 2 % EX PADS
6.0000 | MEDICATED_PAD | Freq: Every day | CUTANEOUS | Status: DC
  Administered 2020-01-30 – 2020-02-21 (×17): 6 via TOPICAL

## 2020-01-28 MED ORDER — SODIUM CHLORIDE 0.9% FLUSH
3.0000 mL | INTRAVENOUS | Status: DC | PRN
Start: 2020-01-28 — End: 2020-02-21
  Administered 2020-02-09 – 2020-02-11 (×3): 3 mL via INTRAVENOUS

## 2020-01-28 MED ORDER — HYDROMORPHONE HCL 1 MG/ML IJ SOLN
1.0000 mg | INTRAMUSCULAR | Status: DC | PRN
  Administered 2020-01-28 – 2020-01-29 (×5): 1 mg via INTRAVENOUS
  Administered 2020-01-29: 0.5 mg via INTRAVENOUS
  Administered 2020-01-29 – 2020-01-30 (×5): 1 mg via INTRAVENOUS
  Filled 2020-01-28 (×9): qty 1

## 2020-01-28 MED ORDER — CALCIUM ACETATE (PHOS BINDER) 667 MG PO CAPS
2668.0000 mg | ORAL_CAPSULE | Freq: Three times a day (TID) | ORAL | Status: DC
Start: 2020-01-28 — End: 2020-02-21
  Administered 2020-01-28 – 2020-02-21 (×56): 2668 mg via ORAL
  Filled 2020-01-28 (×62): qty 4

## 2020-01-28 MED ORDER — VANCOMYCIN HCL 10 G IV SOLR
2500.0000 mg | Freq: Once | INTRAVENOUS | Status: DC
  Administered 2020-01-28: 2500 mg via INTRAVENOUS
  Filled 2020-01-28: qty 2500

## 2020-01-28 MED ORDER — DOXERCALCIFEROL 4 MCG/2ML IV SOLN
4.0000 ug | INTRAVENOUS | Status: DC
  Administered 2020-01-29 – 2020-02-20 (×10): 4 ug via INTRAVENOUS
  Filled 2020-01-28 (×11): qty 2

## 2020-01-28 MED ORDER — TRAZODONE HCL 100 MG PO TABS
100.0000 mg | ORAL_TABLET | Freq: Every day | ORAL | Status: DC
  Administered 2020-01-28 – 2020-02-20 (×24): 100 mg via ORAL
  Filled 2020-01-28 (×25): qty 1

## 2020-01-28 MED ORDER — VANCOMYCIN HCL IN DEXTROSE 750-5 MG/150ML-% IV SOLN
750.0000 mg | INTRAVENOUS | Status: DC
  Filled 2020-01-28 (×2): qty 150

## 2020-01-28 MED ORDER — OXYCODONE-ACETAMINOPHEN 5-325 MG PO TABS
1.0000 | ORAL_TABLET | Freq: Four times a day (QID) | ORAL | Status: DC
  Administered 2020-01-28 – 2020-02-10 (×43): 1 via ORAL
  Filled 2020-01-28 (×47): qty 1

## 2020-01-28 MED ORDER — MUPIROCIN 2 % EX OINT
1.0000 "application " | TOPICAL_OINTMENT | Freq: Two times a day (BID) | CUTANEOUS | Status: AC
  Administered 2020-01-29 – 2020-02-02 (×5): 1 via NASAL
  Filled 2020-01-28 (×4): qty 22

## 2020-01-28 MED ORDER — VANCOMYCIN HCL IN DEXTROSE 1-5 GM/200ML-% IV SOLN: 1000.0000 mg | INTRAVENOUS | Status: DC

## 2020-01-28 MED ORDER — HYDROMORPHONE HCL 1 MG/ML IJ SOLN: 1.0000 mg | Freq: Once | INTRAMUSCULAR | Status: DC

## 2020-01-28 MED ORDER — NON FORMULARY: Freq: Every day | Status: DC

## 2020-01-28 MED ORDER — OXYCODONE-ACETAMINOPHEN 10-325 MG PO TABS: 1.0000 | ORAL_TABLET | Freq: Four times a day (QID) | ORAL | Status: DC

## 2020-01-28 MED ORDER — ONDANSETRON HCL 4 MG/2ML IJ SOLN
4.0000 mg | Freq: Once | INTRAMUSCULAR | Status: AC
  Administered 2020-01-28: 4 mg via INTRAVENOUS
  Filled 2020-01-28: qty 2

## 2020-01-28 MED ORDER — SODIUM POLYSTYRENE SULFONATE 15 GM/60ML PO SUSP
15.0000 g | ORAL | Status: DC | PRN
  Administered 2020-01-29: 15 g via ORAL
  Filled 2020-01-28: qty 60

## 2020-01-28 MED ORDER — SODIUM ZIRCONIUM CYCLOSILICATE 10 G PO PACK: 10.0000 g | PACK | Freq: Once | ORAL | Status: DC

## 2020-01-28 MED ORDER — SODIUM POLYSTYRENE SULFONATE PO POWD: 15.0000 g | ORAL | Status: DC

## 2020-01-28 MED ORDER — VANCOMYCIN HCL IN DEXTROSE 1-5 GM/200ML-% IV SOLN
1000.0000 mg | INTRAVENOUS | Status: AC
  Filled 2020-01-28: qty 200

## 2020-01-28 MED ORDER — SODIUM POLYSTYRENE SULFONATE PO POWD
15.0000 g | ORAL | Status: DC
  Administered 2020-01-31: 15 g via ORAL
  Filled 2020-01-28: qty 15

## 2020-01-28 MED ORDER — PROMETHAZINE HCL 25 MG/ML IJ SOLN
6.2500 mg | Freq: Four times a day (QID) | INTRAMUSCULAR | Status: DC | PRN
  Administered 2020-01-28 – 2020-02-12 (×41): 6.25 mg via INTRAVENOUS
  Filled 2020-01-28 (×46): qty 1

## 2020-01-28 MED ORDER — CINACALCET HCL 30 MG PO TABS
30.0000 mg | ORAL_TABLET | ORAL | Status: DC
  Administered 2020-02-02 – 2020-02-20 (×7): 30 mg via ORAL
  Filled 2020-01-28 (×8): qty 1

## 2020-01-28 MED ORDER — OXYCODONE HCL 5 MG PO TABS
5.0000 mg | ORAL_TABLET | Freq: Four times a day (QID) | ORAL | Status: DC
  Administered 2020-01-28 – 2020-02-10 (×43): 5 mg via ORAL
  Filled 2020-01-28 (×46): qty 1

## 2020-01-28 MED ORDER — ONDANSETRON 4 MG PO TBDP: 4.0000 mg | ORAL_TABLET | Freq: Once | ORAL | Status: DC

## 2020-01-28 MED ORDER — LEVOTHYROXINE SODIUM 25 MCG PO TABS
137.0000 ug | ORAL_TABLET | Freq: Every day | ORAL | Status: DC
  Administered 2020-01-28 – 2020-02-21 (×23): 137 ug via ORAL
  Filled 2020-01-28 (×23): qty 1

## 2020-01-28 MED ORDER — HYDROMORPHONE HCL 1 MG/ML IJ SOLN
1.0000 mg | Freq: Once | INTRAMUSCULAR | Status: AC
  Administered 2020-01-28: 1 mg via INTRAVENOUS
  Filled 2020-01-28: qty 1

## 2020-01-28 MED ORDER — ZOLPIDEM TARTRATE 5 MG PO TABS
10.0000 mg | ORAL_TABLET | Freq: Every day | ORAL | Status: DC
  Administered 2020-01-28 – 2020-02-20 (×24): 10 mg via ORAL
  Filled 2020-01-28 (×24): qty 2

## 2020-01-28 MED ORDER — SODIUM CHLORIDE 0.9% FLUSH
3.0000 mL | Freq: Two times a day (BID) | INTRAVENOUS | Status: DC
  Administered 2020-01-28 – 2020-02-21 (×46): 3 mL via INTRAVENOUS

## 2020-01-28 MED ORDER — ESCITALOPRAM OXALATE 10 MG PO TABS
5.0000 mg | ORAL_TABLET | Freq: Every day | ORAL | Status: DC
  Administered 2020-01-28 – 2020-01-29 (×2): 5 mg via ORAL
  Filled 2020-01-28 (×2): qty 1
  Filled 2020-01-28: qty 0.5

## 2020-01-28 NOTE — Plan of Care (Signed)
?  Problem: Clinical Measurements: ?Goal: Respiratory complications will improve ?Outcome: Progressing ?  ?Problem: Clinical Measurements: ?Goal: Cardiovascular complication will be avoided ?Outcome: Progressing ?  ?Problem: Nutrition: ?Goal: Adequate nutrition will be maintained ?Outcome: Progressing ?  ?Problem: Coping: ?Goal: Level of anxiety will decrease ?Outcome: Progressing ?  ?

## 2020-01-28 NOTE — Consult Note (Signed)
Urology Consult   Physician requesting consult: Lenice Pressman  Reason for consult: Draining left flank sinus tract  History of Present Illness: [name] is a 55 y.o. male with PMH significant for obesity, ESRD on HD, nephrolithiasis, HTN, spina bifida, ileal conduit diversion, and aortic stenosis who presented to the ED today with c/o epistaxis, persistent drainage from a left flank sinus tract, and an abscess on his left thigh over an old AV graft.   He had a nephrostomy tube placed in 11/2018 followed by a perc nephrolithotomy and replacement of nephrostomy tube in 03/2019 which was subsequently removed 04/2019.  He has had persistent serosanguinous drainage from the site of the tube since its removal.  He states it did close in 10/2019 for approx 1 month and then began to drain again.  He noted over the past few days that the volume of drainage had increased and it had become malodorous. He denied change in color or consistency of drainage. He has been changing the dressing over the site approx every 3 days. Urology was consulted due to concern of abscess development in/near tract.   CT A/P reveals left perirenal soft tissue thickening with a tract that extends to the skin surface of left flank which is unchanged from 10/06/19. There is no definite organized fluid collection.  WBC 6, HG 7.9, Cr 10. Pt was started on Vanc and Cefepime in the ED.    He is currently resting comfortably in the ED while RNs look for vascular access to place an IV.     Past Medical History:  Diagnosis Date  . Anemia   . Anxiety   . Clotted dialysis access ([place]) 01/06/2020  . Constipation   . End stage renal failure on dialysis [facility])    [place]; MWF, on HD since 1997, exhausted upper ext access, and possibly LE access; cath dependent in R groin as of 06/2018  . GERD (gastroesophageal reflux disease)   . Grand mal seizure ([place]) X 4   last 1998 (02/29/2016)  . History of blood transfusion 2000s   "I  was having blood loss; never found out from where"  . History of kidney stones   . Hypertension    hx of - has not taken bp meds in over 2 years  since dialysis  . Hypothyroidism   . Insomnia   . Migraine    "due to my BP in my late 1990s; none since" (02/29/2016)  . Nonhealing surgical wound    of the left arteriovenous graft  . Severe aortic stenosis    no problems with per pt. - nephrologist and Dr. Coletta Memos  . Spina bifida [facility])     Past Surgical History:  Procedure Laterality Date  . AMPUTATION Left 12/11/2017   Procedure: Left Great Toe Amputation;  Surgeon: [name], MD;  Location: [place];  Service: Orthopedics;  Laterality: Left;  . APPENDECTOMY    . APPLICATION OF WOUND VAC Left 10/04/2019   Procedure: APPLICATION OF WOUND VAC;  Surgeon: Serafina Mitchell, MD;  Location: [place];  Service: Vascular;  Laterality: Left;  . ARTERIOVENOUS GRAFT PLACEMENT Left 10/16/2012   left femoral goretex graft         Dr Donnetta Hutching  . AV FISTULA PLACEMENT Left 06/27/2012   Procedure: EXPLORATORY LEFT THY-GRAFT PSEUDO-ANEURYSM;  Surgeon: Conrad Pinehurst, MD;  Location: [place];  Service: Vascular;  Laterality: Left;  Revision of left Arteriovenus gortex graft in thigh.  Bertram Savin REMOVAL Left 10/04/2019  Procedure: REMOVAL OF ARTERIOVENOUS GORETEX GRAFT ([place]);  Surgeon: Serafina Mitchell, MD;  Location: [facility] OR;  Service: Vascular;  Laterality: Left;  . COLONOSCOPY    . DRESSING CHANGE UNDER ANESTHESIA Left 10/07/2019   Procedure: DRESSING CHANGE UNDER ANESTHESIA;  Surgeon: Meredith Pel, MD;  Location: [place];  Service: Orthopedics;  Laterality: Left;  . FLEXIBLE SIGMOIDOSCOPY N/A 12/03/2018   Procedure: FLEXIBLE SIGMOIDOSCOPY;  Surgeon: Yetta Flock, MD;  Location: [place];  Service: Gastroenterology;  Laterality: N/A;  . I & D EXTREMITY Left 02/29/2016   Procedure: DEBRIDEMENT LEFT THIGH WOUND;  Surgeon: Waynetta Sandy, MD;  Location: [place];  Service: Vascular;  Laterality: Left;   . I & D EXTREMITY Left 10/04/2019   Procedure: IRRIGATION AND DEBRIDEMENT LEFT THIGH;  Surgeon: Serafina Mitchell, MD;  Location: [place];  Service: Vascular;  Laterality: Left;  . I & D EXTREMITY Right 12/09/2019   Procedure: IRRIGATION AND DEBRIDEMENT EXTREMITY;  Surgeon: Altamese [place], MD;  Location: [place];  Service: Orthopedics;  Laterality: Right;  . ILEOSTOMY  1970s  . INCISION AND DRAINAGE Left 10/16/2012   Procedure: INCISION AND Debridement left thigh graft;  Surgeon: Rosetta Posner, MD;  Location: [place];  Service: Vascular;  Laterality: Left;  . INSERTION OF DIALYSIS CATHETER    . INSERTION OF DIALYSIS CATHETER  01/14/2016   Procedure: INSERTION OF DIALYSIS CATHETER;  Surgeon: Waynetta Sandy, MD;  Location: [place];  Service: Vascular;;  . INSERTION OF DIALYSIS CATHETER Right 08/07/2017   Procedure: INSERTION OF DIALYSIS CATHETER;  Surgeon: Waynetta Sandy, MD;  Location: [place];  Service: Vascular;  Laterality: Right;  . INSERTION OF DIALYSIS CATHETER Right 12/21/2017   Procedure: RIGHT FEMORAL DIALYSIS CATHETER EXCHANGE;  Surgeon: Marty Heck, MD;  Location: [place];  Service: Vascular;  Laterality: Right;  . INSERTION OF ILIAC STENT Left 01/14/2016   Procedure: INSERTION OF ILIAC STENT;  Surgeon: Waynetta Sandy, MD;  Location: [place];  Service: Vascular;  Laterality: Left;  . INTRAOPERATIVE ARTERIOGRAM Left 01/14/2016   Procedure: INTRA OPERATIVE ARTERIOGRAM;  Surgeon: Waynetta Sandy, MD;  Location: [place];  Service: Vascular;  Laterality: Left;  . IR NEPHROSTOMY EXCHANGE LEFT  03/21/2019  . IR NEPHROSTOMY PLACEMENT LEFT  11/28/2018  . IRRIGATION AND DEBRIDEMENT EXTREMITY (Right   12/09/2019  . KNEE ARTHROSCOPY Left 10/07/2019   Procedure: ARTHROSCOPY KNEE w/ STIMULATING  BEAD PLACMENT;  Surgeon: Meredith Pel, MD;  Location: [place];  Service: Orthopedics;  Laterality: Left;  . LEFT HEART CATH AND CORONARY ANGIOGRAPHY N/A 10/17/2019    Procedure: LEFT HEART CATH AND CORONARY ANGIOGRAPHY;  Surgeon: Jettie Booze, MD;  Location: [place] CV LAB;  Service: Cardiovascular;  Laterality: N/A;  . LITHOTRIPSY  x 3   "& a laser treatment" (02/29/2016)  . NEPHROLITHOTOMY Left 03/26/2019   Procedure: NEPHROLITHOTOMY PERCUTANEOUS;  Surgeon: Alexis Frock, MD;  Location: [facility];  Service: Urology;  Laterality: Left;  2 HRS  . NEPHROSTOMY Bilateral 1968  . PATELLAR TENDON REPAIR Right 1990s   "big incision"  . PERIPHERAL VASCULAR CATHETERIZATION  01/14/2016   Procedure: A/V SHUNTOGRAM;  Surgeon: Waynetta Sandy, MD;  Location: [place];  Service: Vascular;;  . REVISION OF ARTERIOVENOUS GORETEX GRAFT Left 10/16/2012   Procedure: REVISION OF LEFT FEMORAL LOOP ARTERIOVENOUS GORETEX GRAFT;  Surgeon: Rosetta Posner, MD;  Location: [place];  Service: Vascular;  Laterality: Left;  . REVISION OF ARTERIOVENOUS GORETEX GRAFT Left 02/11/2015  Procedure: EXCISION OF SMALL SEGMENT OF EXPOSED LEFT THIGH NON FUNCTIONING  ARTERIOVENOUS GORETEX GRAFT;  Surgeon: Mal Misty, MD;  Location: [place];  Service: Vascular;  Laterality: Left;  . REVISION OF ARTERIOVENOUS GORETEX GRAFT Left 01/14/2016   Procedure: REVISION OF ARTERIOVENOUS GORETEX GRAFT;  Surgeon: Waynetta Sandy, MD;  Location: [place];  Service: Vascular;  Laterality: Left;  . REVISION OF ARTERIOVENOUS GORETEX GRAFT Left 02/29/2016   thigh/pt report  . REVISION OF ARTERIOVENOUS GORETEX GRAFT Left 02/29/2016   Procedure: POSSIBLE REVISION OF LEFT THIGH ARTERIOVENOUS GORETEX GRAFT;  Surgeon: Waynetta Sandy, MD;  Location: [place];  Service: Vascular;  Laterality: Left;  . TEE WITHOUT CARDIOVERSION N/A 12/14/2017   Procedure: TRANSESOPHAGEAL ECHOCARDIOGRAM (TEE);  Surgeon: Acie Fredrickson, Wonda Cheng, MD;  Location: [place];  Service: Cardiovascular;  Laterality: N/A;  . THROMBECTOMY AND REVISION OF ARTERIOVENTOUS (AV) GORETEX  GRAFT Left 06/12/2018   Procedure: Removal of  infected left thigh graft;  Surgeon: Rosetta Posner, MD;  Location: [place];  Service: Vascular;  Laterality: Left;  . THROMBECTOMY W/ EMBOLECTOMY Left 01/14/2016   Procedure: THROMBECTOMY ARTERIOVENOUS GORE-TEX Left thigh GRAFT;  Surgeon: Waynetta Sandy, MD;  Location: [place];  Service: Vascular;  Laterality: Left;  . TONGUE SURGERY  ~ 1990   tongue-tie release   . ULTRASOUND GUIDANCE FOR VASCULAR ACCESS Right 08/07/2017   Procedure: ULTRASOUND GUIDANCE FOR VASCULAR CANNULATION RIGHT FEMORAL VEIN AND LEFT AV FEMORAL GRAFT;  Surgeon: Waynetta Sandy, MD;  Location: [place];  Service: Vascular;  Laterality: Right;  . UPPER EXTREMITY VENOGRAPHY Bilateral 08/09/2017   Procedure: UPPER EXTREMITY VENOGRAPHY;  Surgeon: Serafina Mitchell, MD;  Location: [place] CV [facility];  Service: Cardiovascular;  Laterality: Bilateral;  . VENOGRAM N/A 09/20/2017   Procedure: RIGHT COMMON FEMORAL ARTERY EXPLORATION.  CANNULATION RIGHT COMMON FEMORAL VEIN. VENOGRAM CENTRAL ULTRA SOUND GUIDED RIGHT FEMORAL VEIN TIMES TWO.;  Surgeon: Waynetta Sandy, MD;  Location: [place];  Service: Vascular;  Laterality: N/A;  . VENOGRAM Right 10/04/2019   Procedure: VENOGRAM;  Surgeon: Serafina Mitchell, MD;  Location: [place];  Service: Vascular;  Laterality: Right;  . WOUND DEBRIDEMENT Left 02/29/2016   thigh    Current Hospital Medications:  Home Meds:  Current Meds  Medication Sig  . acetaminophen (TYLENOL) 500 MG tablet Take 1,000 mg by mouth every 6 (six) hours as needed for mild pain or headache.  Marland Kitchen aspirin 325 MG tablet Take 325 mg by mouth daily.  . calcium acetate (PHOSLO) 667 MG capsule Take 3-4 capsules (2,001-2,668 mg total) by mouth See admin instructions. Take 2708 mg by mouth 3 times daily with meals, take 2001 mg by mouth with snacks (Patient taking differently: Take 2,668 mg by mouth 3 (three) times daily with meals.)  . clopidogrel (PLAVIX) 75 MG tablet Take 75 mg by mouth daily.  . diclofenac  Sodium (VOLTAREN) 1 % GEL Apply 2 g topically daily as needed (For leg pain).  Marland Kitchen escitalopram (LEXAPRO) 5 MG tablet Take 5 mg by mouth daily.  . famotidine (PEPCID) 40 MG tablet Take 40 mg by mouth daily.  . fentaNYL (DURAGESIC) 75 MCG/HR Place 1 patch onto the skin See admin instructions. Change every 72 hours.  . fluticasone (FLONASE) 50 MCG/ACT nasal spray Place 2 sprays into both nostrils as needed for allergies or rhinitis (congestion). (Patient taking differently: Place 2 sprays into both nostrils daily as needed for allergies or rhinitis (congestion).)  . furosemide (LASIX) 80 MG tablet Take 80  mg by mouth 2 (two) times daily as needed for fluid.   Marland Kitchen levothyroxine (SYNTHROID) 137 MCG tablet Take 137 mcg by mouth daily before breakfast.  . lidocaine (LIDODERM) 5 % Place 2 patches onto the skin daily.  Marland Kitchen LORazepam (ATIVAN) 1 MG tablet Take 1 mg by mouth daily.  . Melatonin 10 MG TABS Take 10 mg by mouth at bedtime.  . Nutritional Supplements (NEPRO) LIQD Take 237 mLs by mouth daily. Mixed Berry  . oxyCODONE-acetaminophen (PERCOCET) 10-325 MG tablet Take 1 tablet by mouth every 6 (six) hours as needed for pain. (Patient taking differently: Take 1 tablet by mouth every 6 (six) hours.)  . promethazine (PHENERGAN) 25 MG tablet Take 1 tablet (25 mg total) by mouth 2 (two) times daily as needed for nausea or vomiting.  . rosuvastatin (CRESTOR) 10 MG tablet Take 1 tablet (10 mg total) by mouth daily.  . sodium polystyrene (KAYEXALATE) powder Take 15 g by mouth See admin instructions. Take 15 g (mixed in water) by mouth on Saturdays and Sundays  . traZODone (DESYREL) 100 MG tablet Take 1 tablet (100 mg total) by mouth at bedtime.  Marland Kitchen zolpidem (AMBIEN) 10 MG tablet Take 1 tablet (10 mg total) by mouth at bedtime.    Scheduled Meds: . calcium acetate  2,668 mg Oral TID WC  . Chlorhexidine Gluconate Cloth  6 each Topical Q0600  . cinacalcet  30 mg Oral Q M,W,F-HD  . doxercalciferol  4 mcg Intravenous  Q M,W,F-HD  . escitalopram  5 mg Oral Daily  . famotidine  40 mg Oral Daily  . levothyroxine  137 mcg Oral Q0600  . oxyCODONE-acetaminophen  1 tablet Oral Q6H   And  . oxyCODONE  5 mg Oral Q6H  . sodium chloride flush  3 mL Intravenous Q12H  . sodium zirconium cyclosilicate  10 g Oral Once  . traZODone  100 mg Oral QHS  . zolpidem  10 mg Oral QHS   Continuous Infusions: . sodium chloride    . vancomycin     PRN Meds:.sodium chloride, fluticasone, HYDROmorphone (DILAUDID) injection, sodium chloride flush  Allergies:  Allergies  Allergen Reactions  . Hydrocodone Other (See Comments)    Caused involuntary movement and twitching. CANNOT TAKE DUE TO MUSCLE SPASMS AND MUSCLE TREMORS  . Furadantin [Nitrofurantoin] Other (See Comments)    UNSPECIFIED REACTION   . Mandelamine [Methenamine] Other (See Comments)    UNSPECIFIED REACTION   . Noroxin [Norfloxacin] Other (See Comments)    UNSPECIFIED REACTION   . Colace [Docusate] Diarrhea and Nausea And Vomiting  . Elemental Sulfur Cough    Childhood reaction - pt could not confirm that it was a cough  . Sulfa Antibiotics Other (See Comments) and Cough    Childhood reaction - pt could not confirm that it was a cough    Family History  Problem Relation Age of Onset  . Diabetes Mother   . Hypertension Mother   . Heart disease Mother        before age 78  . Diabetes Father   . Heart attack Father        X's 3  . Diabetes Sister   . Bipolar disorder Sister     Social History:  reports that he has never smoked. He has never used smokeless tobacco. He reports previous alcohol use. He reports previous drug use. Drug: Marijuana.  ROS: A complete review of systems was performed.  All systems are negative except for pertinent findings as noted.  Physical Exam:  Vital signs in last 24 hours: Temp:  [99.1 F (37.3 C)-99.5 F (37.5 C)] 99.2 F (37.3 C) (01/26 0050) Pulse Rate:  [65-89] 68 (01/26 1059) Resp:  [14-20] 14 (01/26  1059) BP: (109-133)/(60-98) 116/72 (01/26 1059) SpO2:  [97 %-100 %] 98 % (01/26 1059) Constitutional:  Alert and oriented, No acute distress Cardiovascular: Regular rate and rhythm Respiratory: Normal respiratory effort GI: Abdomen is soft, nontender, nondistended, no abdominal masses GU: tract could not be assessed due to RN placing IV  Lymphatic: No lymphadenopathy Neurologic: Grossly intact, no focal deficits Psychiatric: Normal mood and affect  Laboratory Data:  Recent Labs    01/27/20 1534 01/28/20 0523  WBC 6.8 6.0  HGB 8.3* 7.9*  HCT 28.6* 26.4*  PLT PLATELET CLUMPS NOTED ON SMEAR, UNABLE TO ESTIMATE 109*    Recent Labs    01/27/20 1534  NA 138  K 5.7*  CL 94*  GLUCOSE 86  BUN 64*  CALCIUM 8.3*  CREATININE 10.35*     Results for orders placed or performed during the hospital encounter of 01/27/20 (from the past 24 hour(s))  Basic metabolic panel     Status: Abnormal   Collection Time: 01/27/20  3:34 PM  Result Value Ref Range   Sodium 138 135 - 145 mmol/L   Potassium 5.7 (H) 3.5 - 5.1 mmol/L   Chloride 94 (L) 98 - 111 mmol/L   CO2 28 22 - 32 mmol/L   Glucose, Bld 86 70 - 99 mg/dL   BUN 64 (H) 6 - 20 mg/dL   Creatinine, Ser 10.35 (H) 0.61 - 1.24 mg/dL   Calcium 8.3 (L) 8.9 - 10.3 mg/dL   GFR, Estimated 5 (L) >60 mL/min   Anion gap 16 (H) 5 - 15  CBC     Status: Abnormal   Collection Time: 01/27/20  3:34 PM  Result Value Ref Range   WBC 6.8 4.0 - 10.5 K/uL   RBC 2.93 (L) 4.22 - 5.81 MIL/uL   Hemoglobin 8.3 (L) 13.0 - 17.0 g/dL   HCT 28.6 (L) 39.0 - 52.0 %   MCV 97.6 80.0 - 100.0 fL   MCH 28.3 26.0 - 34.0 pg   MCHC 29.0 (L) 30.0 - 36.0 g/dL   RDW 17.9 (H) 11.5 - 15.5 %   Platelets PLATELET CLUMPS NOTED ON SMEAR, UNABLE TO ESTIMATE 150 - 400 K/uL   nRBC 0.0 0.0 - 0.2 %  Troponin I (High Sensitivity)     Status: Abnormal   Collection Time: 01/27/20  3:34 PM  Result Value Ref Range   Troponin I (High Sensitivity) 23 (H) <18 ng/L  Troponin I (High  Sensitivity)     Status: Abnormal   Collection Time: 01/27/20  9:11 PM  Result Value Ref Range   Troponin I (High Sensitivity) 28 (H) <18 ng/L  APTT     Status: Abnormal   Collection Time: 01/28/20  4:34 AM  Result Value Ref Range   aPTT 37 (H) 24 - 36 seconds  Protime-INR     Status: None   Collection Time: 01/28/20  4:34 AM  Result Value Ref Range   Prothrombin Time 14.2 11.4 - 15.2 seconds   INR 1.1 0.8 - 1.2  CBC with Differential     Status: Abnormal   Collection Time: 01/28/20  5:23 AM  Result Value Ref Range   WBC 6.0 4.0 - 10.5 K/uL   RBC 2.75 (L) 4.22 - 5.81 MIL/uL   Hemoglobin 7.9 (L) 13.0 - 17.0  g/dL   HCT 26.4 (L) 39.0 - 52.0 %   MCV 96.0 80.0 - 100.0 fL   MCH 28.7 26.0 - 34.0 pg   MCHC 29.9 (L) 30.0 - 36.0 g/dL   RDW 17.9 (H) 11.5 - 15.5 %   Platelets 109 (L) 150 - 400 K/uL   nRBC 0.0 0.0 - 0.2 %   Neutrophils Relative % 81 %   Neutro Abs 4.9 1.7 - 7.7 K/uL   Lymphocytes Relative 9 %   Lymphs Abs 0.5 (L) 0.7 - 4.0 K/uL   Monocytes Relative 7 %   Monocytes Absolute 0.4 0.1 - 1.0 K/uL   Eosinophils Relative 3 %   Eosinophils Absolute 0.2 0.0 - 0.5 K/uL   Basophils Relative 0 %   Basophils Absolute 0.0 0.0 - 0.1 K/uL   Immature Granulocytes 0 %   Abs Immature Granulocytes 0.02 0.00 - 0.07 K/uL   No results found for this or any previous visit (from the past 240 hour(s)).  Renal Function: Recent Labs    01/27/20 1534  CREATININE 10.35*   Estimated Creatinine Clearance: 10.9 mL/min (A) (by C-G formula based on SCr of 10.35 mg/dL (H)).  Radiologic Imaging: DG Chest 2 View  Result Date: 01/27/2020 CLINICAL DATA:  55 year old male with chest pain. EXAM: CHEST - 2 VIEW COMPARISON:  Chest radiograph dated 12/09/2019. FINDINGS: Mild cardiomegaly and mild vascular congestion. No focal consolidation, pleural effusion or pneumothorax. No acute osseous pathology. Partially visualized abdominal vascular catheter on the lateral view. IMPRESSION: Mild cardiomegaly and  mild vascular congestion. Electronically Signed   By: Anner Crete M.D.   On: 01/27/2020 16:31   [place] LT LOWER EXTREM LTD SOFT TISSUE NON VASCULAR  Result Date: 01/28/2020 CLINICAL DATA:  Left thigh abscess. EXAM: ULTRASOUND LEFT LOWER EXTREMITY LIMITED TECHNIQUE: Ultrasound examination of the lower extremity soft tissues was performed in the area of clinical concern. COMPARISON:  Left knee series 10/05/2019. FINDINGS: A 1.0 x 0.6 x 0.9 cm complex hypoechoic area noted in the left upper thigh in the area of clinical concern. This could represent a small hematoma or abscess. Also noted is a 4.1 x 2.0 x 2.3 cm complex hypoechoic area noted around the left lower extremity graft. This could represent a process such as abscess, hematoma, or pseudoaneurysm. Left lower extremity graft does not appear patent. No flow noted in the above described complex hypoechoic areas. Soft tissue edema most likely present. IMPRESSION: 1. A 1.0 x 0.6 x 0.9 cm complex hypoechoic area noted in the left upper thigh in the area of clinical concern. This could represent a small hematoma or abscess. 2. Also noted is a 4.1 x 2.0 x 2.3 cm complex hypoechoic area noted around the left lower extremity graft. This could represent a process such as abscess, hematoma, or thrombosed pseudoaneurysm. The left lower extremity graft does not appear to be patent. Electronically Signed   By: Marcello Moores  Register   On: 01/28/2020 08:41   CT RENAL STONE STUDY  Result Date: 01/28/2020 CLINICAL DATA:  Draining nephrostomy site, evaluate for abscess. Left groin fistula. EXAM: CT ABDOMEN AND PELVIS WITHOUT CONTRAST TECHNIQUE: Multidetector CT imaging of the abdomen and pelvis was performed following the standard protocol without IV contrast. COMPARISON:  10/06/2019. FINDINGS: Lower chest: Mild scarring in the lingula. Atherosclerotic calcification of the aortic valve and coronary arteries. Pulmonic trunk is enlarged. Heart is at the upper limits of normal in  size to mildly enlarged. No pericardial or pleural effusion. Hepatobiliary: Liver is unremarkable. Stones  are seen in the gallbladder. No biliary ductal dilatation. Pancreas: Negative. Spleen: Negative. Adrenals/Urinary Tract: Adrenal glands are unremarkable. Kidneys are atrophic. Stones are seen in the kidneys bilaterally. There is left perirenal soft tissue thickening which is contiguous with a tract that traverses the intercostal space between the left eleventh and twelfth posterior ribs, extending to the skin surface. No definite organized fluid collection. Appearance is similar to 10/06/2019. Ureters are decompressed. Bladder is very low in volume. Stomach/Bowel: Stomach and majority of the small bowel are unremarkable. 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 Fair amount of stool is seen in the colon. Vascular/Lymphatic: Atherosclerotic calcification of the aorta. Postoperative changes arteriovenous graft in the left groin with a peripherally calcified saccular pseudoaneurysm measuring 2.1 cm. A soft tissue tract is seen to the skin surface (3/99), similar. A stent is seen in left common iliac vein. Central venous catheter is seen from a right femoral approach, terminating in the IVC. Abdominal retroperitoneal lymph nodes measure up to 11 mm in the left periaortic station (3/52), as before. Reproductive: Prostate is visualized. Other: No free fluid. Mesenteries and peritoneum are otherwise unremarkable. Musculoskeletal: Old inferior pubic rami fractures. Renal osteodystrophy. IMPRESSION: 1. Left perirenal soft tissue thickening with a tract that extends to the skin surface of the left flank, unchanged from 10/06/2019. No definite organized fluid collection. 2. Left groin arteriovenous dialysis graft with a soft tissue tract extending to the skin surface, similar to the prior exam. No organized fluid collection. Associated saccular pseudoaneurysm.  3. Cholelithiasis. 4. Bilateral renal stones. 5. 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 6. Stable borderline enlarged abdominal retroperitoneal lymph nodes. 7. Aortic atherosclerosis (ICD10-I70.0). Coronary artery calcification. 8. Enlarged pulmonic trunk, indicative pulmonary arterial hypertension. Electronically Signed   By: Lorin Picket M.D.   On: 01/28/2020 12:10     Impression/Recommendation  Chronic draining nephrostomy tube tract--no evidence of abscess on CT.  Keep area clean and dry with dry dressing changes daily and prn saturation. Continue Abx per IM as pt also has left thigh abscess.  If draining fistula persists, pt may eventually need a left nephrectomy if he is medically stable enough to have surgery.   Debbrah Alar 01/28/2020, 12:43 PM

## 2020-01-28 NOTE — H&P (Addendum)
Date: 01/28/2020               Patient Name:  [name] MRN: [id number]  DOB: Jan 20, 1965 Age / Sex: 55 y.o., male   PCP: [name], MD         Medical Service: Internal Medicine Teaching Service         Attending Physician: Dr. Oda Kilts, MD    First Contact: Dr. Bridgett Larsson Pager: [phone]  Second Contact: Dr. Sharon Seller Pager: [phone]       After Hours (After 5p/  First Contact Pager: [phone]  weekends / holidays): Second Contact Pager: [phone]   Chief Complaint: Abscess   History of Present Illness:   [name] 55 yo male with history of ESRD on HD MWF, HTN, severe aortic stenosis, spina bifida with myelomeningocele, chronic pain syndrome, PAD s/p left iliac artery stenting in 2018, nonobstructive CAD, MSSA mitral valve endocarditis presenting to the ED for an abscess.  [name] states that in the past 24 hours, he has noted a small abscess forming on his left thigh that is overlying an area of previous graft repair.  He has noticed redness surrounding the abscess that seems to be spreading.  The area is very tender.  He denies any drainage though.  Since the onset of this, he has noted fevers that feel different from his recent fevers he has had last few weeks but he cannot describe how.  [name] also notes that he had a nephrostomy tube placed sometime over the last 2 years and starting in October 2021, the area "opened back up."  Since then he has been having continuous serosanguineous drainage, but recently has become more yellow.  Additional changes to the areas that it has become very tender to palpation and the drainage has become malodorous.  Additionally, [name] complains of a nosebleed that started approximately 24 hours from bilateral nares that has been very difficult to control.  He has been blowing his nose with large clot output.  The bleeds do stop intermittently, but then he needs to blow his nose and the bleeding restarts.  He  unfortunately is swallowing some of the blood at times and he feels this has made him nauseous.  He notes a history of nosebleeds only when Plavix is restarted for the first day, but does not have nosebleeds at any other times.  He is not on any blood thinners but does get heparin at his dialysis sessions.  He is on antiplatelet, aspirin and Plavix.  He notes his sister has a history of frequent epistaxis but no formal diagnosis of any disorder.  He denies any hemoptysis.  Lastly, [name] also reports having some right-sided chest pain since yesterday that is worse with applying pressure and with movement.  Denies any falls or trauma to the area.  Patient was recently admitted from 1/03 through 7/61 for complications with HD catheter that was repaired by vascular surgery, acute gout of the right knee treated with prednisone and fevers that were felt to be secondary to patient's gout with negative infectious work-up.  ED Course:  On arrival to the ED, patient's vitals were stable with a blood pressure of 119/61, heart rate of 79, oxygen saturation of 97% with respiration rate of 18.  Patient was afebrile at 99.5 Fahrenheit.  Initial CBC shows a normal white blood count of 6.8, hemoglobin of 8.3 with clumped platelets unable to read out.  MP showed hyperkalemia at 5.7, creatinine of  10.35 with anion gap of 16.  Troponins were flat at 23 and then 28.  After initial evaluation, IMTS was contacted for admission due to concern for thrombocytopenia and abscess.  Meds:  No current facility-administered medications on file prior to encounter.   Current Outpatient Medications on File Prior to Encounter  Medication Sig Dispense Refill  . acetaminophen (TYLENOL) 500 MG tablet Take 1,000 mg by mouth every 6 (six) hours as needed for mild pain or headache.    Marland Kitchen aspirin 325 MG tablet Take 325 mg by mouth daily.    . calcium acetate (PHOSLO) 667 MG capsule Take 3-4 capsules (2,001-2,668 mg total) by mouth See  admin instructions. Take 2708 mg by mouth 3 times daily with meals, take 2001 mg by mouth with snacks (Patient taking differently: Take 2,668 mg by mouth 3 (three) times daily with meals.) 450 capsule 0  . clopidogrel (PLAVIX) 75 MG tablet Take 75 mg by mouth daily.    . diclofenac Sodium (VOLTAREN) 1 % GEL Apply 2 g topically daily as needed (For leg pain).    Marland Kitchen escitalopram (LEXAPRO) 5 MG tablet Take 5 mg by mouth daily.    . famotidine (PEPCID) 40 MG tablet Take 40 mg by mouth daily.    . fentaNYL (DURAGESIC) 75 MCG/HR Place 1 patch onto the skin See admin instructions. Change every 72 hours.    . fluticasone (FLONASE) 50 MCG/ACT nasal spray Place 2 sprays into both nostrils as needed for allergies or rhinitis (congestion). (Patient taking differently: Place 2 sprays into both nostrils daily as needed for allergies or rhinitis (congestion).) 0.003 g 0  . furosemide (LASIX) 80 MG tablet Take 80 mg by mouth 2 (two) times daily as needed for fluid.     Marland Kitchen levothyroxine (SYNTHROID) 137 MCG tablet Take 137 mcg by mouth daily before breakfast.    . lidocaine (LIDODERM) 5 % Place 2 patches onto the skin daily.    Marland Kitchen LORazepam (ATIVAN) 1 MG tablet Take 1 mg by mouth daily.    . Melatonin 10 MG TABS Take 10 mg by mouth at bedtime.    . Nutritional Supplements (NEPRO) LIQD Take 237 mLs by mouth daily. Mixed Berry    . oxyCODONE-acetaminophen (PERCOCET) 10-325 MG tablet Take 1 tablet by mouth every 6 (six) hours as needed for pain. (Patient taking differently: Take 1 tablet by mouth every 6 (six) hours.) 10 tablet 0  . promethazine (PHENERGAN) 25 MG tablet Take 1 tablet (25 mg total) by mouth 2 (two) times daily as needed for nausea or vomiting. 30 tablet 0  . rosuvastatin (CRESTOR) 10 MG tablet Take 1 tablet (10 mg total) by mouth daily. 30 tablet 0  . sodium polystyrene (KAYEXALATE) powder Take 15 g by mouth See admin instructions. Take 15 g (mixed in water) by mouth on Saturdays and Sundays 135 g 0  .  traZODone (DESYREL) 100 MG tablet Take 1 tablet (100 mg total) by mouth at bedtime. 30 tablet 0  . zolpidem (AMBIEN) 10 MG tablet Take 1 tablet (10 mg total) by mouth at bedtime. 7 tablet 0   Allergies: Allergies as of 01/27/2020 - Review Complete 01/27/2020  Allergen Reaction Noted  . Hydrocodone Other (See Comments) 11/05/2012  . Furadantin [nitrofurantoin] Other (See Comments) 06/27/2012  . Mandelamine [methenamine] Other (See Comments) 06/27/2012  . Noroxin [norfloxacin] Other (See Comments) 06/27/2012  . Colace [docusate] Diarrhea and Nausea And Vomiting 10/13/2019  . Elemental sulfur Cough 02/08/2015  . Sulfa antibiotics Other (See Comments)  and Cough 06/24/2012   Past Medical History:  Diagnosis Date  . Anemia   . Anxiety   . Clotted dialysis access ([place]) 01/06/2020  . Constipation   . End stage renal failure on dialysis [facility])    [place]; MWF, on HD since 1997, exhausted upper ext access, and possibly LE access; cath dependent in R groin as of 06/2018  . GERD (gastroesophageal reflux disease)   . Grand mal seizure ([place]) X 4   last 1998 (02/29/2016)  . History of blood transfusion 2000s   "I was having blood loss; never found out from where"  . History of kidney stones   . Hypertension    hx of - has not taken bp meds in over 2 years  since dialysis  . Hypothyroidism   . Insomnia   . Migraine    "due to my BP in my late 1990s; none since" (02/29/2016)  . Nonhealing surgical wound    of the left arteriovenous graft  . Severe aortic stenosis    no problems with per pt. - nephrologist and Dr. Coletta Memos  . Spina bifida ([place])    Family History:  Grandmother (maternal): Lung cancer Grandmother (paternal): Stomach cancer   Social History:  Denies any alcohol use, tobacco use, current drug use. Notes he smoked marijuana more than 5 years ago. Denies any cocaine use.  Lives with step-sister, [name].   Review of Systems: A complete ROS was negative except as per HPI.    Physical Exam: Blood pressure 109/60, pulse 70, temperature 99.2 F (37.3 C), resp. rate 15, SpO2 97 %.  Physical Exam Vitals and nursing note reviewed.  Constitutional:      General: He is not in acute distress.    Appearance: He is obese.  HENT:     Head: Normocephalic and atraumatic.     Nose:     Right Nostril: Epistaxis present. No foreign body.     Left Nostril: Epistaxis present. No foreign body.     Comments: Bilateral nares with significant dried blood present, but no active bleed seen.  Left nare with some septal thinning.     Mouth/Throat:     Mouth: Mucous membranes are moist.     Tongue: No lesions. Tongue does not deviate from midline.     Tonsils: No tonsillar exudate or tonsillar abscesses.     Comments: Superior soft palate with petechiae present diffusely.  On right buccal region, patient has a small 3 mm blood blister.  Poor dentition.  No other abnormalities noted.  Cardiovascular:     Rate and Rhythm: Normal rate and regular rhythm.     Heart sounds: Murmur (systolic) heard.    Pulmonary:     Effort: Pulmonary effort is normal. No respiratory distress.     Breath sounds: No wheezing, rhonchi or rales.  Abdominal:     General: Bowel sounds are normal. There is no distension.     Palpations: Abdomen is soft.     Tenderness: There is no abdominal tenderness. There is no guarding.  Musculoskeletal:     Right lower leg: 1+ Edema present.     Left lower leg: 1+ Edema present.  Skin:    General: Skin is warm and dry.     Findings: Ecchymosis present.     Comments:  Large ecchymosis overlying the inferior aspect of the right breast with significant tenderness to palpation.  Additional ecchymosis on the lateral aspect of the left breast without tenderness though. Significant ecchymosis overlying  the left lower quadrant and left flank with various stages of healing present.  Other than oropharyngeal petechiae described above, no other petechiae or purpura  noted.  Left thigh: Approximately 1 cm circular abscess with no central opening or discharge present with surrounding erythema with significant tenderness to palpation.  Left CVA: Previous nephrostomy tube location with significant thick serosanguineous discharge with erythema and tenderness to palpation  Neurological:     General: No focal deficit present.     Mental Status: He is alert and oriented to person, place, and time. Mental status is at baseline.     Comments: 5 out of 5 strength of bilateral upper and lower extremities.  No altered sensation.  Psychiatric:        Mood and Affect: Mood normal.        Behavior: Behavior normal.    Assessment & Plan by Problem: Active Problems:   Abscess of left thigh  Jovani Flury 55 yo male with history of ESRD on HD MWF, HTN, severe aortic stenosis, spina bifida with myelomeningocele, chronic pain syndrome, PAD s/p left iliac artery stenting in 2018, nonobstructive CAD, presenting to the ED for an abscess currently admitted for epistaxis, spontaneous ecchymosis and petechiae being evaluated for vasculitis, additionally left thigh abscess with possibly infected left CVA sinus tract.  # Epistaxis  # Spontaneous Ecchymosis # Oropharyngeal Petechiae   Patient presenting with profound epistaxis that failed conservative treatment, although patient continues to blow his nose which is likely exacerbating the bleeding.  On examination he did have oropharyngeal petechiae and evidence of large spontaneous ecchymosis on bilateral chest.  This was initially concerning for acute severe thrombocytopenia, however CBC showed a mild thrombocytopenia with platelets of 109.  Given he has a history of recent hemarthrosis, will check von Willebrand panel.  Additionally, given that patient has a history of fevers of unknown etiology on his last admission, with severely elevated inflammatory markers recently, with now new petechiae and profound epistaxis, there is  concern for vasculitis.  He does have risk factors for drug-induced vasculitis. We will initiate work-up with ESR, CRP, ANCA panel and complement levels.  Patient does have a heparin exposure chronically at dialysis, however no evidence of thrombosis and PT/INR and PTT are within normal limits.  Lastly patient does have elevated BUN compared to baseline at 64, so there could be an aspect of uremic platelet dysfunction.  - TXA nasal packing by EDP - CRP  - ESR - ANCA panel - C3 and C4 complement levels  - Von Willebrand Panel  - Hepatitis C antibody  - Smear review  - Trend platelets   # Left Thigh Abscess  Small abscess of the left thigh, however it is overlying the major vasculature of his leg.  He will likely need drainage, however will need vascular surgery consulted for this.  Of note, patient does have a history of a left thigh wound that was infected in October 2021; cultures at the time grew Corynebacterium stratium.  Patient required drainage with wound VAC placement.  He was treated with 4 weeks of vancomycin and ceftazidime.  - Soft tissue ultrasound to assess for extent of abscess - Vascular surgery consultation - Vancomycin and Cefepime per pharmacy  - Dilaudid 1 mg IV for breakthrough pain  # Left flank sinus tract  Several month history of drainage from previous left nephrostomy tube placed.  He had a CT renal study in October 2021 that did show a sinus tract leading to the skin, however  no evidence of abscess.  Given that the drainage quality has become more yellow and malodorous with development of erythema and pain, I am concerned that there is a new infection of this area.  Antimicrobials started by ED provider and will continue at this time.  - Vancomycin and Cefepime per pharmacy  - Consider further imaging to evaluate for abscess or expansion of sinus tract   # ESRD # Hyperkalemia  Based on chart review, patient has been end-stage renal disease on dialysis since he  was 55 years old.  Etiology documented is secondary to neurogenic bladder secondary to spina bifida. Today, hyperkalemic at 5.7 on arrival, however no EKG changes at this time.  Patient had his home Kayexalate with him and had already taken it, so Lokelma was not given.  Will need dialysis today.   - Nephrology consult - Renal function panel ordered  # Chronic Pain Syndrome  - Continue home Oxycodone 10 mg q6h   # Non-obstructive CAD - Holding Plavix and Aspirin in light of acute bleed   Dispo: Admit patient to Inpatient with expected length of stay greater than 2 midnights.  Signed: Dr. Jose Persia Internal Medicine PGY-2  Pager: [phone] After 5pm on weekdays and 1pm on weekends: On Call pager [phone]  01/28/2020, 8:12 AM

## 2020-01-28 NOTE — Hospital Course (Addendum)
Left thigh abscess s/p HD graft removal 1/30 -blood and surgical cultures NGTD -ABX completed with 5d of vanc Plan -Will need to transition off IV dilaudid   Left flank sinus tract without abscess--confirmed on CT this admission Urology recommending nephrectomy however pt refuses.  Plan -outpt f/u with Dr. Tresa Moore (urology)  Epistaxis (resolved) and spontaneous ecchymosis ? 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 Recurrent clots   2/5: -States he did not slep well, pain

## 2020-01-28 NOTE — ED Notes (Signed)
HD called, pt very anxious about getting HD today. They have him on the schedule for tomorrow.

## 2020-01-28 NOTE — Progress Notes (Signed)
Pharmacy Antibiotic Note  [name] is a 55 y.o. male admitted on 01/27/2020 with wound infection.  Pharmacy has been consulted for vancomycin dosing. WBC wnl. H/o MWF HD with plan for HD today   Plan: -Vancomycin 2.5 gm IV load followed by h 1 gm IV Q MWF with HD  -Monitor CBC, renal fx, cultures and clinical progress -VT at SS      Temp (24hrs), Avg:99.3 F (37.4 C), Min:99.1 F (37.3 C), Max:99.5 F (37.5 C)  Recent Labs  Lab 01/27/20 1534 01/28/20 0523  WBC 6.8 6.0  CREATININE 10.35*  --     Estimated Creatinine Clearance: 10.9 mL/min (A) (by C-G formula based on SCr of 10.35 mg/dL (H)).    Allergies  Allergen Reactions  . Hydrocodone Other (See Comments)    Caused involuntary movement and twitching. CANNOT TAKE DUE TO MUSCLE SPASMS AND MUSCLE TREMORS  . Furadantin [Nitrofurantoin] Other (See Comments)    UNSPECIFIED REACTION   . Mandelamine [Methenamine] Other (See Comments)    UNSPECIFIED REACTION   . Noroxin [Norfloxacin] Other (See Comments)    UNSPECIFIED REACTION   . Colace [Docusate] Diarrhea and Nausea And Vomiting  . Elemental Sulfur Cough    Childhood reaction - pt could not confirm that it was a cough  . Sulfa Antibiotics Other (See Comments) and Cough    Childhood reaction - pt could not confirm that it was a cough    Antimicrobials this admission: Vanc 1/26 >>  Cefepime x 1 1/26  Dose adjustments this admission:   Microbiology results: 1/26 BCx:   Thank you for allowing pharmacy to be a part of this patient's care.  Albertina Parr, PharmD., BCPS, BCCCP Clinical Pharmacist Please refer to [facility] for unit-specific pharmacist

## 2020-01-28 NOTE — ED Notes (Signed)
Pt very upset that his HD is not until tomorrow. He has been screaming, pulling his hair and stating that he is "going to die." I have reassured pt that we will not let that happen. I reached out to Dr. Rebeca Alert, who also let Dr. Bridgett Larsson know. Dr. Bridgett Larsson will be coming to speak to the pt on the unit.

## 2020-01-28 NOTE — Progress Notes (Signed)
Spoke with Dr. Bridgett Larsson, [facility] for patient to have Ice chips. Caleb Popp, RN

## 2020-01-28 NOTE — H&P (View-Only) (Signed)
Vascular and Vein Specialist of [facility]  Patient name: [name] MRN: [id number] DOB: 1965-04-30 Sex: male   REQUESTING PROVIDER:    ER   REASON FOR CONSULT:    Left thigh abscess  HISTORY OF PRESENT ILLNESS:   [name] is a 55 y.o. male, who is well-known to our service for his multiple dialysis access issues.  Currently he is dialyzing through a right thigh catheter.  He states that over the past day or so he has noticed increasing swelling and redness in his left thigh over top of his prior grafts.  This is associated with fevers.  There is no drainage.  He has previously undergone resection of several grafts in his leg.  He also notes some drainage from his nephrostomy tube with color change.  He has also had issues with a nosebleed recently he reports chest pain.  PAST MEDICAL HISTORY    Past Medical History:  Diagnosis Date  . Anemia   . Anxiety   . Clotted dialysis access ([name]) 01/06/2020  . Constipation   . End stage renal failure on dialysis [facility])    [place]; MWF, on HD since 1997, exhausted upper ext access, and possibly LE access; cath dependent in R groin as of 06/2018  . GERD (gastroesophageal reflux disease)   . Grand mal seizure ([place]) X 4   last 1998 (02/29/2016)  . History of blood transfusion 2000s   "I was having blood loss; never found out from where"  . History of kidney stones   . Hypertension    hx of - has not taken bp meds in over 2 years  since dialysis  . Hypothyroidism   . Insomnia   . Migraine    "due to my BP in my late 1990s; none since" (02/29/2016)  . Nonhealing surgical wound    of the left arteriovenous graft  . Severe aortic stenosis    no problems with per pt. - nephrologist and Dr. Coletta Memos  . Spina bifida ([place])      FAMILY HISTORY   Family History  Problem Relation Age of Onset  . Diabetes Mother   . Hypertension Mother   . Heart disease Mother        before age 7  .  Diabetes Father   . Heart attack Father        X's 3  . Diabetes Sister   . Bipolar disorder Sister     SOCIAL HISTORY:   Social History   Socioeconomic History  . Marital status: Single    Spouse name: Not on file  . Number of children: 0  . Years of education: Not on file  . Highest education level: Not on file  Occupational History  . Occupation: Disabled  Tobacco Use  . Smoking status: Never Smoker  . Smokeless tobacco: Never Used  Vaping Use  . Vaping Use: Never used  Substance and Sexual Activity  . Alcohol use: Not Currently    Alcohol/week: 0.0 standard drinks    Comment: 02/29/2016 "nothing since  the mid 1990s  . Drug use: Not Currently    Types: Marijuana    Comment: 02/29/2016 "nothing since ~ 1995"  . Sexual activity: Yes  Other Topics Concern  . Not on file  Social History Narrative   Lives with sister    Social Determinants of Health   Financial Resource Strain: Not on file  Food Insecurity: Not on file  Transportation Needs: Not on file  Physical Activity: Not  on file  Stress: Not on file  Social Connections: Not on file  Intimate Partner Violence: Not on file    ALLERGIES:    Allergies  Allergen Reactions  . Hydrocodone Other (See Comments)    Caused involuntary movement and twitching. CANNOT TAKE DUE TO MUSCLE SPASMS AND MUSCLE TREMORS  . Furadantin [Nitrofurantoin] Other (See Comments)    UNSPECIFIED REACTION   . Mandelamine [Methenamine] Other (See Comments)    UNSPECIFIED REACTION   . Noroxin [Norfloxacin] Other (See Comments)    UNSPECIFIED REACTION   . Colace [Docusate] Diarrhea and Nausea And Vomiting  . Elemental Sulfur Cough    Childhood reaction - pt could not confirm that it was a cough  . Sulfa Antibiotics Other (See Comments) and Cough    Childhood reaction - pt could not confirm that it was a cough    CURRENT MEDICATIONS:    Current Facility-Administered Medications  Medication Dose Route Frequency Provider Last Rate  Last Admin  . 0.9 %  sodium chloride infusion  250 mL Intravenous PRN Jose Persia, MD      . calcium acetate (PHOSLO) capsule 2,668 mg  2,668 mg Oral TID WC Jose Persia, MD   2,668 mg at 01/28/20 0714  . Chlorhexidine Gluconate Cloth 2 % PADS 6 each  6 each Topical Q0600 Collins, Hervey Ard, PA-C      . cinacalcet (SENSIPAR) tablet 30 mg  30 mg Oral Q M,W,F-HD Collins, Samantha G, PA-C      . doxercalciferol (HECTOROL) injection 4 mcg  4 mcg Intravenous Q M,W,F-HD Collins, Samantha G, PA-C      . escitalopram (LEXAPRO) tablet 5 mg  5 mg Oral Daily Jose Persia, MD      . famotidine (PEPCID) tablet 40 mg  40 mg Oral Daily Jose Persia, MD   40 mg at 01/28/20 1337  . fluticasone (FLONASE) 50 MCG/ACT nasal spray 2 spray  2 spray Each Nare PRN [name], MD      . HYDROmorphone (DILAUDID) injection 1 mg  1 mg Intravenous Q4H PRN Jose Persia, MD   1 mg at 01/28/20 1005  . levothyroxine (SYNTHROID) tablet 137 mcg  137 mcg Oral Q0600 Jose Persia, MD   137 mcg at 01/28/20 0714  . NON FORMULARY   Oral Daily [name], MD      . oxyCODONE-acetaminophen (PERCOCET/ROXICET) 5-325 MG per tablet 1 tablet  1 tablet Oral Q6H Oda Kilts, MD   1 tablet at 01/28/20 1337   And  . oxyCODONE (Oxy IR/ROXICODONE) immediate release tablet 5 mg  5 mg Oral Q6H Oda Kilts, MD   5 mg at 01/28/20 [id number]  . sodium chloride flush (NS) 0.9 % injection 3 mL  3 mL Intravenous Q12H Jose Persia, MD      . sodium chloride flush (NS) 0.9 % injection 3 mL  3 mL Intravenous PRN Jose Persia, MD      . sodium zirconium cyclosilicate (LOKELMA) packet 10 g  10 g Oral Once McDonald, Mia A, PA-C      . traZODone (DESYREL) tablet 100 mg  100 mg Oral QHS Jose Persia, MD      . vancomycin (VANCOCIN) IVPB 750 mg/150 ml premix  750 mg Intravenous Q M,W,F-HD Mancheril, Darnell Level, RPH      . zolpidem (AMBIEN) tablet 10 mg  10 mg Oral QHS Jose Persia, MD       Current Outpatient  Medications  Medication Sig Dispense Refill  .  acetaminophen (TYLENOL) 500 MG tablet Take 1,000 mg by mouth every 6 (six) hours as needed for mild pain or headache.    Marland Kitchen aspirin 325 MG tablet Take 325 mg by mouth daily.    . calcium acetate (PHOSLO) 667 MG capsule Take 3-4 capsules (2,001-2,668 mg total) by mouth See admin instructions. Take 2708 mg by mouth 3 times daily with meals, take 2001 mg by mouth with snacks (Patient taking differently: Take 2,668 mg by mouth 3 (three) times daily with meals.) 450 capsule 0  . clopidogrel (PLAVIX) 75 MG tablet Take 75 mg by mouth daily.    . diclofenac Sodium (VOLTAREN) 1 % GEL Apply 2 g topically daily as needed (For leg pain).    Marland Kitchen escitalopram (LEXAPRO) 5 MG tablet Take 5 mg by mouth daily.    . famotidine (PEPCID) 40 MG tablet Take 40 mg by mouth daily.    . fentaNYL (DURAGESIC) 75 MCG/HR Place 1 patch onto the skin See admin instructions. Change every 72 hours.    . fluticasone (FLONASE) 50 MCG/ACT nasal spray Place 2 sprays into both nostrils as needed for allergies or rhinitis (congestion). (Patient taking differently: Place 2 sprays into both nostrils daily as needed for allergies or rhinitis (congestion).) 0.003 g 0  . furosemide (LASIX) 80 MG tablet Take 80 mg by mouth 2 (two) times daily as needed for fluid.     Marland Kitchen levothyroxine (SYNTHROID) 137 MCG tablet Take 137 mcg by mouth daily before breakfast.    . lidocaine (LIDODERM) 5 % Place 2 patches onto the skin daily.    Marland Kitchen LORazepam (ATIVAN) 1 MG tablet Take 1 mg by mouth daily.    . Melatonin 10 MG TABS Take 10 mg by mouth at bedtime.    . Nutritional Supplements (NEPRO) LIQD Take 237 mLs by mouth daily. Mixed Berry    . oxyCODONE-acetaminophen (PERCOCET) 10-325 MG tablet Take 1 tablet by mouth every 6 (six) hours as needed for pain. (Patient taking differently: Take 1 tablet by mouth every 6 (six) hours.) 10 tablet 0  . promethazine (PHENERGAN) 25 MG tablet Take 1 tablet (25 mg total) by mouth 2  (two) times daily as needed for nausea or vomiting. 30 tablet 0  . rosuvastatin (CRESTOR) 10 MG tablet Take 1 tablet (10 mg total) by mouth daily. 30 tablet 0  . sodium polystyrene (KAYEXALATE) powder Take 15 g by mouth See admin instructions. Take 15 g (mixed in water) by mouth on Saturdays and Sundays 135 g 0  . traZODone (DESYREL) 100 MG tablet Take 1 tablet (100 mg total) by mouth at bedtime. 30 tablet 0  . zolpidem (AMBIEN) 10 MG tablet Take 1 tablet (10 mg total) by mouth at bedtime. 7 tablet 0    REVIEW OF SYSTEMS:   '[X]'$  denotes positive finding, '[ ]'$  denotes negative finding Cardiac  Comments:  Chest pain or chest pressure:    Shortness of breath upon exertion:    Short of breath when lying flat:    Irregular heart rhythm:        Vascular    Pain in calf, thigh, or hip brought on by ambulation:    Pain in feet at night that wakes you up from your sleep:     Blood clot in your veins:    Leg swelling:         Pulmonary    Oxygen at home:    Productive cough:     Wheezing:  Neurologic    Sudden weakness in arms or legs:     Sudden numbness in arms or legs:     Sudden onset of difficulty speaking or slurred speech:    Temporary loss of vision in one eye:     Problems with dizziness:         Gastrointestinal    Blood in stool:      Vomited blood:         Genitourinary    Burning when urinating:     Blood in urine:        Psychiatric    Major depression:         Hematologic    Bleeding problems:    Problems with blood clotting too easily:        Skin    Rashes or ulcers:        Constitutional    Fever or chills:     PHYSICAL EXAM:   Vitals:   01/28/20 0520 01/28/20 0645 01/28/20 1045 01/28/20 1059  BP: 126/69 109/60 116/72 116/72  Pulse: 72 70 65 68  Resp: '16 15  14  '$ Temp:      TempSrc:      SpO2: 100% 97% 99% 98%    GENERAL: The patient is a well-nourished male, in no acute distress. The vital signs are documented above. CARDIAC: There is  a regular rate and rhythm.  VASCULAR: Dialysis catheter in right groin.  Multiple nonfunctioning grafts in left groin PULMONARY: Nonlabored respirations SKIN: See photo below  PSYCHIATRIC: The patient has a normal affect.    STUDIES:   I have reviewed his ultrasound with the following findings 1. A 1.0 x 0.6 x 0.9 cm complex hypoechoic area noted in the left upper thigh in the area of clinical concern. This could represent a small hematoma or abscess.  2. Also noted is a 4.1 x 2.0 x 2.3 cm complex hypoechoic area noted around the left lower extremity graft. This could represent a process such as abscess, hematoma, or thrombosed pseudoaneurysm. The left lower extremity graft does not appear to be patent.  ASSESSMENT and PLAN   The patient has a abscess in his left groin over top of old thrombosed dialysis graft.  I discussed with the patient that this needs to be surgically excised in the operating room.  He has recently eaten and so I will schedule this for tomorrow.  He will be n.p.o. after midnight.   Leia Alf, MD, FACS [facility] of [facility] [phone] Pager [phone]

## 2020-01-28 NOTE — ED Notes (Signed)
Report given to the nurse on [facility] RN

## 2020-01-28 NOTE — Consult Note (Addendum)
Vascular and Vein Specialist of [facility]  Patient name: [name] MRN: [id number] DOB: 06-29-1965 Sex: male   REQUESTING PROVIDER:    ER   REASON FOR CONSULT:    Left thigh abscess  HISTORY OF PRESENT ILLNESS:   Asriel Okon is a 55 y.o. male, who is well-known to our service for his multiple dialysis access issues.  Currently he is dialyzing through a right thigh catheter.  He states that over the past day or so he has noticed increasing swelling and redness in his left thigh over top of his prior grafts.  This is associated with fevers.  There is no drainage.  He has previously undergone resection of several grafts in his leg.  He also notes some drainage from his nephrostomy tube with color change.  He has also had issues with a nosebleed recently he reports chest pain.  PAST MEDICAL HISTORY    Past Medical History:  Diagnosis Date  . Anemia   . Anxiety   . Clotted dialysis access ([place]) 01/06/2020  . Constipation   . End stage renal failure on dialysis [facility])    [place]; MWF, on HD since 1997, exhausted upper ext access, and possibly LE access; cath dependent in R groin as of 06/2018  . GERD (gastroesophageal reflux disease)   . Grand mal seizure ([place]) X 4   last 1998 (02/29/2016)  . History of blood transfusion 2000s   "I was having blood loss; never found out from where"  . History of kidney stones   . Hypertension    hx of - has not taken bp meds in over 2 years  since dialysis  . Hypothyroidism   . Insomnia   . Migraine    "due to my BP in my late 1990s; none since" (02/29/2016)  . Nonhealing surgical wound    of the left arteriovenous graft  . Severe aortic stenosis    no problems with per pt. - nephrologist and Dr. Coletta Memos  . Spina bifida ([place])      FAMILY HISTORY   Family History  Problem Relation Age of Onset  . Diabetes Mother   . Hypertension Mother   . Heart disease Mother        before age 49  .  Diabetes Father   . Heart attack Father        X's 3  . Diabetes Sister   . Bipolar disorder Sister     SOCIAL HISTORY:   Social History   Socioeconomic History  . Marital status: Single    Spouse name: Not on file  . Number of children: 0  . Years of education: Not on file  . Highest education level: Not on file  Occupational History  . Occupation: Disabled  Tobacco Use  . Smoking status: Never Smoker  . Smokeless tobacco: Never Used  Vaping Use  . Vaping Use: Never used  Substance and Sexual Activity  . Alcohol use: Not Currently    Alcohol/week: 0.0 standard drinks    Comment: 02/29/2016 "nothing since  the mid 1990s  . Drug use: Not Currently    Types: Marijuana    Comment: 02/29/2016 "nothing since ~ 1995"  . Sexual activity: Yes  Other Topics Concern  . Not on file  Social History Narrative   Lives with sister    Social Determinants of Health   Financial Resource Strain: Not on file  Food Insecurity: Not on file  Transportation Needs: Not on file  Physical Activity: Not  on file  Stress: Not on file  Social Connections: Not on file  Intimate Partner Violence: Not on file    ALLERGIES:    Allergies  Allergen Reactions  . Hydrocodone Other (See Comments)    Caused involuntary movement and twitching. CANNOT TAKE DUE TO MUSCLE SPASMS AND MUSCLE TREMORS  . Furadantin [Nitrofurantoin] Other (See Comments)    UNSPECIFIED REACTION   . Mandelamine [Methenamine] Other (See Comments)    UNSPECIFIED REACTION   . Noroxin [Norfloxacin] Other (See Comments)    UNSPECIFIED REACTION   . Colace [Docusate] Diarrhea and Nausea And Vomiting  . Elemental Sulfur Cough    Childhood reaction - pt could not confirm that it was a cough  . Sulfa Antibiotics Other (See Comments) and Cough    Childhood reaction - pt could not confirm that it was a cough    CURRENT MEDICATIONS:    Current Facility-Administered Medications  Medication Dose Route Frequency Provider Last Rate  Last Admin  . 0.9 %  sodium chloride infusion  250 mL Intravenous PRN Jose Persia, MD      . calcium acetate (PHOSLO) capsule 2,668 mg  2,668 mg Oral TID WC Jose Persia, MD   2,668 mg at 01/28/20 0714  . Chlorhexidine Gluconate Cloth 2 % PADS 6 each  6 each Topical Q0600 Collins, Hervey Ard, PA-C      . cinacalcet (SENSIPAR) tablet 30 mg  30 mg Oral Q M,W,F-HD Collins, Samantha G, PA-C      . doxercalciferol (HECTOROL) injection 4 mcg  4 mcg Intravenous Q M,W,F-HD Collins, Samantha G, PA-C      . escitalopram (LEXAPRO) tablet 5 mg  5 mg Oral Daily Jose Persia, MD      . famotidine (PEPCID) tablet 40 mg  40 mg Oral Daily Jose Persia, MD   40 mg at 01/28/20 1337  . fluticasone (FLONASE) 50 MCG/ACT nasal spray 2 spray  2 spray Each Nare PRN [name], MD      . HYDROmorphone (DILAUDID) injection 1 mg  1 mg Intravenous Q4H PRN Jose Persia, MD   1 mg at 01/28/20 1005  . levothyroxine (SYNTHROID) tablet 137 mcg  137 mcg Oral Q0600 Jose Persia, MD   137 mcg at 01/28/20 0714  . NON FORMULARY   Oral Daily [name], MD      . oxyCODONE-acetaminophen (PERCOCET/ROXICET) 5-325 MG per tablet 1 tablet  1 tablet Oral Q6H Oda Kilts, MD   1 tablet at 01/28/20 1337   And  . oxyCODONE (Oxy IR/ROXICODONE) immediate release tablet 5 mg  5 mg Oral Q6H Oda Kilts, MD   5 mg at 01/28/20 [id number]  . sodium chloride flush (NS) 0.9 % injection 3 mL  3 mL Intravenous Q12H Jose Persia, MD      . sodium chloride flush (NS) 0.9 % injection 3 mL  3 mL Intravenous PRN Jose Persia, MD      . sodium zirconium cyclosilicate (LOKELMA) packet 10 g  10 g Oral Once McDonald, Mia A, PA-C      . traZODone (DESYREL) tablet 100 mg  100 mg Oral QHS Jose Persia, MD      . vancomycin (VANCOCIN) IVPB 750 mg/150 ml premix  750 mg Intravenous Q M,W,F-HD Mancheril, Darnell Level, RPH      . zolpidem (AMBIEN) tablet 10 mg  10 mg Oral QHS Jose Persia, MD       Current Outpatient  Medications  Medication Sig Dispense Refill  .  acetaminophen (TYLENOL) 500 MG tablet Take 1,000 mg by mouth every 6 (six) hours as needed for mild pain or headache.    Marland Kitchen aspirin 325 MG tablet Take 325 mg by mouth daily.    . calcium acetate (PHOSLO) 667 MG capsule Take 3-4 capsules (2,001-2,668 mg total) by mouth See admin instructions. Take 2708 mg by mouth 3 times daily with meals, take 2001 mg by mouth with snacks (Patient taking differently: Take 2,668 mg by mouth 3 (three) times daily with meals.) 450 capsule 0  . clopidogrel (PLAVIX) 75 MG tablet Take 75 mg by mouth daily.    . diclofenac Sodium (VOLTAREN) 1 % GEL Apply 2 g topically daily as needed (For leg pain).    Marland Kitchen escitalopram (LEXAPRO) 5 MG tablet Take 5 mg by mouth daily.    . famotidine (PEPCID) 40 MG tablet Take 40 mg by mouth daily.    . fentaNYL (DURAGESIC) 75 MCG/HR Place 1 patch onto the skin See admin instructions. Change every 72 hours.    . fluticasone (FLONASE) 50 MCG/ACT nasal spray Place 2 sprays into both nostrils as needed for allergies or rhinitis (congestion). (Patient taking differently: Place 2 sprays into both nostrils daily as needed for allergies or rhinitis (congestion).) 0.003 g 0  . furosemide (LASIX) 80 MG tablet Take 80 mg by mouth 2 (two) times daily as needed for fluid.     Marland Kitchen levothyroxine (SYNTHROID) 137 MCG tablet Take 137 mcg by mouth daily before breakfast.    . lidocaine (LIDODERM) 5 % Place 2 patches onto the skin daily.    Marland Kitchen LORazepam (ATIVAN) 1 MG tablet Take 1 mg by mouth daily.    . Melatonin 10 MG TABS Take 10 mg by mouth at bedtime.    . Nutritional Supplements (NEPRO) LIQD Take 237 mLs by mouth daily. Mixed Berry    . oxyCODONE-acetaminophen (PERCOCET) 10-325 MG tablet Take 1 tablet by mouth every 6 (six) hours as needed for pain. (Patient taking differently: Take 1 tablet by mouth every 6 (six) hours.) 10 tablet 0  . promethazine (PHENERGAN) 25 MG tablet Take 1 tablet (25 mg total) by mouth 2  (two) times daily as needed for nausea or vomiting. 30 tablet 0  . rosuvastatin (CRESTOR) 10 MG tablet Take 1 tablet (10 mg total) by mouth daily. 30 tablet 0  . sodium polystyrene (KAYEXALATE) powder Take 15 g by mouth See admin instructions. Take 15 g (mixed in water) by mouth on Saturdays and Sundays 135 g 0  . traZODone (DESYREL) 100 MG tablet Take 1 tablet (100 mg total) by mouth at bedtime. 30 tablet 0  . zolpidem (AMBIEN) 10 MG tablet Take 1 tablet (10 mg total) by mouth at bedtime. 7 tablet 0    REVIEW OF SYSTEMS:   '[X]'$  denotes positive finding, '[ ]'$  denotes negative finding Cardiac  Comments:  Chest pain or chest pressure:    Shortness of breath upon exertion:    Short of breath when lying flat:    Irregular heart rhythm:        Vascular    Pain in calf, thigh, or hip brought on by ambulation:    Pain in feet at night that wakes you up from your sleep:     Blood clot in your veins:    Leg swelling:         Pulmonary    Oxygen at home:    Productive cough:     Wheezing:  Neurologic    Sudden weakness in arms or legs:     Sudden numbness in arms or legs:     Sudden onset of difficulty speaking or slurred speech:    Temporary loss of vision in one eye:     Problems with dizziness:         Gastrointestinal    Blood in stool:      Vomited blood:         Genitourinary    Burning when urinating:     Blood in urine:        Psychiatric    Major depression:         Hematologic    Bleeding problems:    Problems with blood clotting too easily:        Skin    Rashes or ulcers:        Constitutional    Fever or chills:     PHYSICAL EXAM:   Vitals:   01/28/20 0520 01/28/20 0645 01/28/20 1045 01/28/20 1059  BP: 126/69 109/60 116/72 116/72  Pulse: 72 70 65 68  Resp: '16 15  14  '$ Temp:      TempSrc:      SpO2: 100% 97% 99% 98%    GENERAL: The patient is a well-nourished male, in no acute distress. The vital signs are documented above. CARDIAC: There is  a regular rate and rhythm.  VASCULAR: Dialysis catheter in right groin.  Multiple nonfunctioning grafts in left groin PULMONARY: Nonlabored respirations SKIN: See photo below  PSYCHIATRIC: The patient has a normal affect.    STUDIES:   I have reviewed his ultrasound with the following findings 1. A 1.0 x 0.6 x 0.9 cm complex hypoechoic area noted in the left upper thigh in the area of clinical concern. This could represent a small hematoma or abscess.  2. Also noted is a 4.1 x 2.0 x 2.3 cm complex hypoechoic area noted around the left lower extremity graft. This could represent a process such as abscess, hematoma, or thrombosed pseudoaneurysm. The left lower extremity graft does not appear to be patent.  ASSESSMENT and PLAN   The patient has a abscess in his left groin over top of old thrombosed dialysis graft.  I discussed with the patient that this needs to be surgically excised in the operating room.  He has recently eaten and so I will schedule this for tomorrow.  He will be n.p.o. after midnight.   Leia Alf, MD, FACS [facility] [phone] Pager [phone]

## 2020-01-28 NOTE — Consult Note (Signed)
[facility] Renal Consultation Note    Indication for Consultation:  Management of ESRD/hemodialysis, anemia, hypertension/volume, and secondary hyperparathyroidism.  HPI: Cartier Llanes is a 55 y.o. male with past medical history including ESRD on dialysis Monday Wednesday Friday (has chronic issues with thigh TDC and limited vascular options, recent admission for this), hypertension, aortic stenosis, and left nephrostomy tube, who presented to the ED on 01/26/2018 with multiple complaints. He reported a new small abscess on his left thigh overlying his previous graft. Also noticed increased drainage from a wound on his back at site of prior nephrostomy site. He also reports ported and nosebleed that started approximately 24 hours ago. Reports bleeding was stopped temporarily, but then would blow his nose and bleeding was started again. He also reported right-sided chest pain worse with palpation and movement. Patient was admitted for management of abscesses and started on vancomycin and cefepime empirically.   He did have potassium of 5.7 on arrival. He takes Kayexalate on nondialysis days and took his Kayexalate and was given a dose of Lokelma. Labs otherwise notable for hemoglobin 7.9, BUN 64, creatinine 10.35. His last dialysis was 01/26/2020 and he did leave 3.7 kg over his estimated dry weight. Weights and treatment times have been variable.  Pt seen in ED room.  Main c/o is boil in medial aspect of L groin.  He states he had some infection of the L thigh AVG last year treated surgically by [facility] and w/ abx.  He also has had some minor bleeding from his prior L PCN site.  He notes a bruise in the R axilla, tender.  Nosebleed as per the chart which has improved.  No SOB or orthopnea or swelling.     Past Medical History:  Diagnosis Date  . Anemia   . Anxiety   . Clotted dialysis access ([place]) 01/06/2020  . Constipation   . End stage renal failure on dialysis [facility])    [place];  MWF, on HD since 1997, exhausted upper ext access, and possibly LE access; cath dependent in R groin as of 06/2018  . GERD (gastroesophageal reflux disease)   . Grand mal seizure ([place]) X 4   last 1998 (02/29/2016)  . History of blood transfusion 2000s   "I was having blood loss; never found out from where"  . History of kidney stones   . Hypertension    hx of - has not taken bp meds in over 2 years  since dialysis  . Hypothyroidism   . Insomnia   . Migraine    "due to my BP in my late 1990s; none since" (02/29/2016)  . Nonhealing surgical wound    of the left arteriovenous graft  . Severe aortic stenosis    no problems with per pt. - nephrologist and Dr. Coletta Memos  . Spina bifida [facility])    Past Surgical History:  Procedure Laterality Date  . AMPUTATION Left 12/11/2017   Procedure: Left Great Toe Amputation;  Surgeon: [name], MD;  Location: [place];  Service: Orthopedics;  Laterality: Left;  . APPENDECTOMY    . APPLICATION OF WOUND VAC Left 10/04/2019   Procedure: APPLICATION OF WOUND VAC;  Surgeon: Serafina Mitchell, MD;  Location: [place];  Service: Vascular;  Laterality: Left;  . ARTERIOVENOUS GRAFT PLACEMENT Left 10/16/2012   left femoral goretex graft         Dr Donnetta Hutching  . AV FISTULA PLACEMENT Left 06/27/2012   Procedure: EXPLORATORY LEFT THY-GRAFT PSEUDO-ANEURYSM;  Surgeon: Aaron Edelman  Starlyn Skeans, MD;  Location: [facility] OR;  Service: Vascular;  Laterality: Left;  Revision of left Arteriovenus gortex graft in thigh.  Bertram Savin REMOVAL Left 10/04/2019   Procedure: REMOVAL OF ARTERIOVENOUS GORETEX GRAFT ([place]);  Surgeon: Serafina Mitchell, MD;  Location: [facility] OR;  Service: Vascular;  Laterality: Left;  . COLONOSCOPY    . DRESSING CHANGE UNDER ANESTHESIA Left 10/07/2019   Procedure: DRESSING CHANGE UNDER ANESTHESIA;  Surgeon: Meredith Pel, MD;  Location: [place];  Service: Orthopedics;  Laterality: Left;  . FLEXIBLE SIGMOIDOSCOPY N/A 12/03/2018   Procedure: FLEXIBLE SIGMOIDOSCOPY;  Surgeon: Yetta Flock, MD;  Location: [place];  Service: Gastroenterology;  Laterality: N/A;  . I & D EXTREMITY Left 02/29/2016   Procedure: DEBRIDEMENT LEFT THIGH WOUND;  Surgeon: Waynetta Sandy, MD;  Location: [place];  Service: Vascular;  Laterality: Left;  . I & D EXTREMITY Left 10/04/2019   Procedure: IRRIGATION AND DEBRIDEMENT LEFT THIGH;  Surgeon: Serafina Mitchell, MD;  Location: [place];  Service: Vascular;  Laterality: Left;  . I & D EXTREMITY Right 12/09/2019   Procedure: IRRIGATION AND DEBRIDEMENT EXTREMITY;  Surgeon: [place], MD;  Location: [place];  Service: Orthopedics;  Laterality: Right;  . ILEOSTOMY  1970s  . INCISION AND DRAINAGE Left 10/16/2012   Procedure: INCISION AND Debridement left thigh graft;  Surgeon: Rosetta Posner, MD;  Location: [place];  Service: Vascular;  Laterality: Left;  . INSERTION OF DIALYSIS CATHETER    . INSERTION OF DIALYSIS CATHETER  01/14/2016   Procedure: INSERTION OF DIALYSIS CATHETER;  Surgeon: Waynetta Sandy, MD;  Location: [place];  Service: Vascular;;  . INSERTION OF DIALYSIS CATHETER Right 08/07/2017   Procedure: INSERTION OF DIALYSIS CATHETER;  Surgeon: Waynetta Sandy, MD;  Location: [place];  Service: Vascular;  Laterality: Right;  . INSERTION OF DIALYSIS CATHETER Right 12/21/2017   Procedure: RIGHT FEMORAL DIALYSIS CATHETER EXCHANGE;  Surgeon: Marty Heck, MD;  Location: [place];  Service: Vascular;  Laterality: Right;  . INSERTION OF ILIAC STENT Left 01/14/2016   Procedure: INSERTION OF ILIAC STENT;  Surgeon: Waynetta Sandy, MD;  Location: [place];  Service: Vascular;  Laterality: Left;  . INTRAOPERATIVE ARTERIOGRAM Left 01/14/2016   Procedure: INTRA OPERATIVE ARTERIOGRAM;  Surgeon: Waynetta Sandy, MD;  Location: [place];  Service: Vascular;  Laterality: Left;  . IR NEPHROSTOMY EXCHANGE LEFT  03/21/2019  . IR NEPHROSTOMY PLACEMENT LEFT  11/28/2018  . IRRIGATION AND DEBRIDEMENT EXTREMITY (Right   12/09/2019  .  KNEE ARTHROSCOPY Left 10/07/2019   Procedure: ARTHROSCOPY KNEE w/ STIMULATING  BEAD PLACMENT;  Surgeon: Meredith Pel, MD;  Location: [place];  Service: Orthopedics;  Laterality: Left;  . LEFT HEART CATH AND CORONARY ANGIOGRAPHY N/A 10/17/2019   Procedure: LEFT HEART CATH AND CORONARY ANGIOGRAPHY;  Surgeon: Jettie Booze, MD;  Location: [place] CV LAB;  Service: Cardiovascular;  Laterality: N/A;  . LITHOTRIPSY  x 3   "& a laser treatment" (02/29/2016)  . NEPHROLITHOTOMY Left 03/26/2019   Procedure: NEPHROLITHOTOMY PERCUTANEOUS;  Surgeon: Alexis Frock, MD;  Location: [facility];  Service: Urology;  Laterality: Left;  2 HRS  . NEPHROSTOMY Bilateral 1968  . PATELLAR TENDON REPAIR Right 1990s   "big incision"  . PERIPHERAL VASCULAR CATHETERIZATION  01/14/2016   Procedure: A/V SHUNTOGRAM;  Surgeon: Waynetta Sandy, MD;  Location: [place];  Service: Vascular;;  . REVISION OF ARTERIOVENOUS GORETEX GRAFT Left 10/16/2012   Procedure: REVISION OF LEFT FEMORAL LOOP  ARTERIOVENOUS GORETEX GRAFT;  Surgeon: Rosetta Posner, MD;  Location: [place];  Service: Vascular;  Laterality: Left;  . REVISION OF ARTERIOVENOUS GORETEX GRAFT Left 02/11/2015   Procedure: EXCISION OF SMALL SEGMENT OF EXPOSED LEFT THIGH NON FUNCTIONING  ARTERIOVENOUS GORETEX GRAFT;  Surgeon: Mal Misty, MD;  Location: [place];  Service: Vascular;  Laterality: Left;  . REVISION OF ARTERIOVENOUS GORETEX GRAFT Left 01/14/2016   Procedure: REVISION OF ARTERIOVENOUS GORETEX GRAFT;  Surgeon: Waynetta Sandy, MD;  Location: [place];  Service: Vascular;  Laterality: Left;  . REVISION OF ARTERIOVENOUS GORETEX GRAFT Left 02/29/2016   thigh/pt report  . REVISION OF ARTERIOVENOUS GORETEX GRAFT Left 02/29/2016   Procedure: POSSIBLE REVISION OF LEFT THIGH ARTERIOVENOUS GORETEX GRAFT;  Surgeon: Waynetta Sandy, MD;  Location: [place];  Service: Vascular;  Laterality: Left;  . TEE WITHOUT CARDIOVERSION N/A 12/14/2017   Procedure:  TRANSESOPHAGEAL ECHOCARDIOGRAM (TEE);  Surgeon: Acie Fredrickson, Wonda Cheng, MD;  Location: [place];  Service: Cardiovascular;  Laterality: N/A;  . THROMBECTOMY AND REVISION OF ARTERIOVENTOUS (AV) GORETEX  GRAFT Left 06/12/2018   Procedure: Removal of infected left thigh graft;  Surgeon: Rosetta Posner, MD;  Location: [place];  Service: Vascular;  Laterality: Left;  . THROMBECTOMY W/ EMBOLECTOMY Left 01/14/2016   Procedure: THROMBECTOMY ARTERIOVENOUS GORE-TEX Left thigh GRAFT;  Surgeon: Waynetta Sandy, MD;  Location: [place];  Service: Vascular;  Laterality: Left;  . TONGUE SURGERY  ~ 1990   tongue-tie release   . ULTRASOUND GUIDANCE FOR VASCULAR ACCESS Right 08/07/2017   Procedure: ULTRASOUND GUIDANCE FOR VASCULAR CANNULATION RIGHT FEMORAL VEIN AND LEFT AV FEMORAL GRAFT;  Surgeon: Waynetta Sandy, MD;  Location: [place];  Service: Vascular;  Laterality: Right;  . UPPER EXTREMITY VENOGRAPHY Bilateral 08/09/2017   Procedure: UPPER EXTREMITY VENOGRAPHY;  Surgeon: Serafina Mitchell, MD;  Location: [place] CV [facility];  Service: Cardiovascular;  Laterality: Bilateral;  . VENOGRAM N/A 09/20/2017   Procedure: RIGHT COMMON FEMORAL ARTERY EXPLORATION.  CANNULATION RIGHT COMMON FEMORAL VEIN. VENOGRAM CENTRAL ULTRA SOUND GUIDED RIGHT FEMORAL VEIN TIMES TWO.;  Surgeon: Waynetta Sandy, MD;  Location: [place];  Service: Vascular;  Laterality: N/A;  . VENOGRAM Right 10/04/2019   Procedure: VENOGRAM;  Surgeon: Serafina Mitchell, MD;  Location: [place];  Service: Vascular;  Laterality: Right;  . WOUND DEBRIDEMENT Left 02/29/2016   thigh   Family History  Problem Relation Age of Onset  . Diabetes Mother   . Hypertension Mother   . Heart disease Mother        before age 73  . Diabetes Father   . Heart attack Father        X's 3  . Diabetes Sister   . Bipolar disorder Sister    Social History:  reports that he has never smoked. He has never used smokeless tobacco. He reports previous alcohol use. He  reports previous drug use. Drug: Marijuana.  ROS: As per HPI otherwise negative.  Physical Exam: Vitals:   01/27/20 2151 01/28/20 0050 01/28/20 0520 01/28/20 0645  BP:  128/68 126/69 109/60  Pulse:  71 72 70  Resp:  '20 16 15  '$ Temp: 99.2 F (37.3 C) 99.2 F (37.3 C)    TempSrc: Oral     SpO2:  99% 100% 97%     General: alert, not in distress Neck: no jvd Lungs: c;ear bilat, no rales, R base dc'd Heart: RRR soft sem, no S3 Abdomen: soft ntnd, L flank w/ dried blood at former  PCN site Musculoskeletal: L groin boil / abscess w/o drainage , L medial groin, old AVG palpable in the L lower thigh position Lower extremities: no edema Neuro: A&Ox 3 Dialysis Access:  R thigh TDC  Allergies  Allergen Reactions  . Hydrocodone Other (See Comments)    Caused involuntary movement and twitching. CANNOT TAKE DUE TO MUSCLE SPASMS AND MUSCLE TREMORS  . Furadantin [Nitrofurantoin] Other (See Comments)    UNSPECIFIED REACTION   . Mandelamine [Methenamine] Other (See Comments)    UNSPECIFIED REACTION   . Noroxin [Norfloxacin] Other (See Comments)    UNSPECIFIED REACTION   . Colace [Docusate] Diarrhea and Nausea And Vomiting  . Elemental Sulfur Cough    Childhood reaction - pt could not confirm that it was a cough  . Sulfa Antibiotics Other (See Comments) and Cough    Childhood reaction - pt could not confirm that it was a cough   Prior to Admission medications   Medication Sig Start Date End Date Taking? Authorizing Provider  acetaminophen (TYLENOL) 500 MG tablet Take 1,000 mg by mouth every 6 (six) hours as needed for mild pain or headache.   Yes [provider]  aspirin 325 MG tablet Take 325 mg by mouth daily.   Yes [provider]  calcium acetate (PHOSLO) 667 MG capsule Take 3-4 capsules (2,001-2,668 mg total) by mouth See admin instructions. Take 2708 mg by mouth 3 times daily with meals, take 2001 mg by mouth with snacks Patient taking differently: Take 2,668 mg by  mouth 3 (three) times daily with meals. 01/14/18  Yes Hennie Duos, MD  clopidogrel (PLAVIX) 75 MG tablet Take 75 mg by mouth daily.   Yes [provider]  diclofenac Sodium (VOLTAREN) 1 % GEL Apply 2 g topically daily as needed (For leg pain).   Yes [provider]  escitalopram (LEXAPRO) 5 MG tablet Take 5 mg by mouth daily.   Yes [provider]  famotidine (PEPCID) 40 MG tablet Take 40 mg by mouth daily. 09/01/10  Yes [provider]  fentaNYL (DURAGESIC) 75 MCG/HR Place 1 patch onto the skin See admin instructions. Change every 72 hours. 01/27/20 01/26/21 Yes [provider]  fluticasone (FLONASE) 50 MCG/ACT nasal spray Place 2 sprays into both nostrils as needed for allergies or rhinitis (congestion). Patient taking differently: Place 2 sprays into both nostrils daily as needed for allergies or rhinitis (congestion). 01/14/18  Yes Hennie Duos, MD  furosemide (LASIX) 80 MG tablet Take 80 mg by mouth 2 (two) times daily as needed for fluid.    Yes [provider]  levothyroxine (SYNTHROID) 137 MCG tablet Take 137 mcg by mouth daily before breakfast.   Yes [provider]  lidocaine (LIDODERM) 5 % Place 2 patches onto the skin daily. 01/27/20  Yes [provider]  LORazepam (ATIVAN) 1 MG tablet Take 1 mg by mouth daily.   Yes [provider]  Melatonin 10 MG TABS Take 10 mg by mouth at bedtime.   Yes [provider]  Nutritional Supplements (NEPRO) LIQD Take 237 mLs by mouth daily. Mixed Berry   Yes [provider]  oxyCODONE-acetaminophen (PERCOCET) 10-325 MG tablet Take 1 tablet by mouth every 6 (six) hours as needed for pain. Patient taking differently: Take 1 tablet by mouth every 6 (six) hours. 06/14/18  Yes Dagoberto Ligas, PA-C  promethazine (PHENERGAN) 25 MG tablet Take 1 tablet (25 mg total) by mouth 2 (two) times daily as needed for nausea or vomiting.  01/14/18  Yes Hennie Duos,  MD  rosuvastatin (CRESTOR) 10 MG tablet Take 1 tablet (10 mg total) by mouth daily. 10/18/19 12/09/19 Yes Brimage, Vondra, DO  sodium polystyrene (KAYEXALATE) powder Take 15 g by mouth See admin instructions. Take 15 g (mixed in water) by mouth on Saturdays and Sundays 01/14/18  Yes Hennie Duos, MD  traZODone (DESYREL) 100 MG tablet Take 1 tablet (100 mg total) by mouth at bedtime. 01/14/18  Yes Hennie Duos, MD  zolpidem (AMBIEN) 10 MG tablet Take 1 tablet (10 mg total) by mouth at bedtime. 01/14/18  Yes Hennie Duos, MD   Current Facility-Administered Medications  Medication Dose Route Frequency Provider Last Rate Last Admin  . 0.9 %  sodium chloride infusion  250 mL Intravenous PRN Jose Persia, MD      . calcium acetate (PHOSLO) capsule 2,668 mg  2,668 mg Oral TID WC Jose Persia, MD   2,668 mg at 01/28/20 0714  . Chlorhexidine Gluconate Cloth 2 % PADS 6 each  6 each Topical Q0600 Collins, Samantha G, PA-C      . escitalopram (LEXAPRO) tablet 5 mg  5 mg Oral Daily Jose Persia, MD      . famotidine (PEPCID) tablet 40 mg  40 mg Oral Daily Jose Persia, MD      . fluticasone (FLONASE) 50 MCG/ACT nasal spray 2 spray  2 spray Each Nare PRN [name], MD      . HYDROmorphone (DILAUDID) injection 1 mg  1 mg Intravenous Q4H PRN Jose Persia, MD      . levothyroxine (SYNTHROID) tablet 137 mcg  137 mcg Oral Q0600 Jose Persia, MD   137 mcg at 01/28/20 0714  . oxyCODONE-acetaminophen (PERCOCET/ROXICET) 5-325 MG per tablet 1 tablet  1 tablet Oral Q6H Oda Kilts, MD   1 tablet at 01/28/20 [id number]   And  . oxyCODONE (Oxy IR/ROXICODONE) immediate release tablet 5 mg  5 mg Oral Q6H Oda Kilts, MD   5 mg at 01/28/20 [id number]  . sodium chloride flush (NS) 0.9 % injection 3 mL  3 mL Intravenous Q12H Jose Persia, MD      . sodium chloride flush (NS) 0.9 % injection 3 mL  3 mL Intravenous PRN Jose Persia, MD      . sodium zirconium cyclosilicate (LOKELMA)  packet 10 g  10 g Oral Once McDonald, Mia A, PA-C      . traZODone (DESYREL) tablet 100 mg  100 mg Oral QHS Jose Persia, MD      . vancomycin (VANCOCIN) 2,500 mg in sodium chloride 0.9 % 500 mL IVPB  2,500 mg Intravenous Once Veryl Speak, MD      . zolpidem (AMBIEN) tablet 10 mg  10 mg Oral QHS Jose Persia, MD       Current Outpatient Medications  Medication Sig Dispense Refill  . acetaminophen (TYLENOL) 500 MG tablet Take 1,000 mg by mouth every 6 (six) hours as needed for mild pain or headache.    Marland Kitchen aspirin 325 MG tablet Take 325 mg by mouth daily.    . calcium acetate (PHOSLO) 667 MG capsule Take 3-4 capsules (2,001-2,668 mg total) by mouth See admin instructions. Take 2708 mg by mouth 3 times daily with meals, take 2001 mg by mouth with snacks (Patient taking differently: Take 2,668 mg by mouth 3 (three) times daily with meals.) 450 capsule 0  . clopidogrel (PLAVIX) 75 MG tablet Take 75 mg by mouth daily.    Marland Kitchen  diclofenac Sodium (VOLTAREN) 1 % GEL Apply 2 g topically daily as needed (For leg pain).    Marland Kitchen escitalopram (LEXAPRO) 5 MG tablet Take 5 mg by mouth daily.    . famotidine (PEPCID) 40 MG tablet Take 40 mg by mouth daily.    . fentaNYL (DURAGESIC) 75 MCG/HR Place 1 patch onto the skin See admin instructions. Change every 72 hours.    . fluticasone (FLONASE) 50 MCG/ACT nasal spray Place 2 sprays into both nostrils as needed for allergies or rhinitis (congestion). (Patient taking differently: Place 2 sprays into both nostrils daily as needed for allergies or rhinitis (congestion).) 0.003 g 0  . furosemide (LASIX) 80 MG tablet Take 80 mg by mouth 2 (two) times daily as needed for fluid.     Marland Kitchen levothyroxine (SYNTHROID) 137 MCG tablet Take 137 mcg by mouth daily before breakfast.    . lidocaine (LIDODERM) 5 % Place 2 patches onto the skin daily.    Marland Kitchen LORazepam (ATIVAN) 1 MG tablet Take 1 mg by mouth daily.    . Melatonin 10 MG TABS Take 10 mg by mouth at bedtime.    . Nutritional  Supplements (NEPRO) LIQD Take 237 mLs by mouth daily. Mixed Berry    . oxyCODONE-acetaminophen (PERCOCET) 10-325 MG tablet Take 1 tablet by mouth every 6 (six) hours as needed for pain. (Patient taking differently: Take 1 tablet by mouth every 6 (six) hours.) 10 tablet 0  . promethazine (PHENERGAN) 25 MG tablet Take 1 tablet (25 mg total) by mouth 2 (two) times daily as needed for nausea or vomiting. 30 tablet 0  . rosuvastatin (CRESTOR) 10 MG tablet Take 1 tablet (10 mg total) by mouth daily. 30 tablet 0  . sodium polystyrene (KAYEXALATE) powder Take 15 g by mouth See admin instructions. Take 15 g (mixed in water) by mouth on Saturdays and Sundays 135 g 0  . traZODone (DESYREL) 100 MG tablet Take 1 tablet (100 mg total) by mouth at bedtime. 30 tablet 0  . zolpidem (AMBIEN) 10 MG tablet Take 1 tablet (10 mg total) by mouth at bedtime. 7 tablet 0   Labs: Basic Metabolic Panel: Recent Labs  Lab 01/27/20 1534  NA 138  K 5.7*  CL 94*  CO2 28  GLUCOSE 86  BUN 64*  CREATININE 10.35*  CALCIUM 8.3*   Liver Function Tests: No results for input(s): AST, ALT, ALKPHOS, BILITOT, PROT, ALBUMIN in the last 168 hours. No results for input(s): LIPASE, AMYLASE in the last 168 hours. No results for input(s): AMMONIA in the last 168 hours. CBC: Recent Labs  Lab 01/27/20 1534 01/28/20 0523  WBC 6.8 6.0  NEUTROABS  --  4.9  HGB 8.3* 7.9*  HCT 28.6* 26.4*  MCV 97.6 96.0  PLT PLATELET CLUMPS NOTED ON SMEAR, UNABLE TO ESTIMATE 109*   Studies/Results: DG Chest 2 View  Result Date: 01/27/2020 CLINICAL DATA:  55 year old male with chest pain. EXAM: CHEST - 2 VIEW COMPARISON:  Chest radiograph dated 12/09/2019. FINDINGS: Mild cardiomegaly and mild vascular congestion. No focal consolidation, pleural effusion or pneumothorax. No acute osseous pathology. Partially visualized abdominal vascular catheter on the lateral view. IMPRESSION: Mild cardiomegaly and mild vascular congestion. Electronically Signed    By: Anner Crete M.D.   On: 01/27/2020 16:31   [place] LT LOWER EXTREM LTD SOFT TISSUE NON VASCULAR  Result Date: 01/28/2020 CLINICAL DATA:  Left thigh abscess. EXAM: ULTRASOUND LEFT LOWER EXTREMITY LIMITED TECHNIQUE: Ultrasound examination of the lower extremity soft tissues was performed in  the area of clinical concern. COMPARISON:  Left knee series 10/05/2019. FINDINGS: A 1.0 x 0.6 x 0.9 cm complex hypoechoic area noted in the left upper thigh in the area of clinical concern. This could represent a small hematoma or abscess. Also noted is a 4.1 x 2.0 x 2.3 cm complex hypoechoic area noted around the left lower extremity graft. This could represent a process such as abscess, hematoma, or pseudoaneurysm. Left lower extremity graft does not appear patent. No flow noted in the above described complex hypoechoic areas. Soft tissue edema most likely present. IMPRESSION: 1. A 1.0 x 0.6 x 0.9 cm complex hypoechoic area noted in the left upper thigh in the area of clinical concern. This could represent a small hematoma or abscess. 2. Also noted is a 4.1 x 2.0 x 2.3 cm complex hypoechoic area noted around the left lower extremity graft. This could represent a process such as abscess, hematoma, or thrombosed pseudoaneurysm. The left lower extremity graft does not appear to be patent. Electronically Signed   By: Marcello Moores  Register   On: 01/28/2020 08:41    Dialysis Orders:  Center: [facility] on MWF. 4 hr 45 min, Optiflux 200NRe, BFR 350, DFR Manual 800 mL/min, EDW 118 (kg), Dialysate 2.0 K, 2.25 Ca Thigh TDC Heparin 3700 unit bolus Sensipar 30 mg q HD Hectorol 4 mcg q HD Mircera 225 mcg IV q 2 weeks- last dose 200 mcg on 1/17  Assessment/Plan: 1.  Thigh abscess: over site of prior AVG. He has had multiple infections in this area before. On empiric antibiotics per admitting team. VVS consulted.  2. L flank sinus tract: from site of previous nephrostomy tube. On antibiotics as above.  3.  ESRD:  MWF schedule, K+  slighlt elevated on arrival which is a chronic issue for this patient. He was given kayexalate and lokelma. Will plan for HD today. Treatment will be abbreviated due to high census/staffing shortage.s  4.  Hypertension/volume: BP controlled. EDw raised recently. UF as tolerated, follow weights.  5.  Anemia: Hgb has been running low since last admission. Not due for ESA yet. Will hold heparin given recent nose bleed 6.  Metabolic bone disease: Continue sensipar, hectorol and binders. Follow phosphorus/calcium.    Anice Paganini, PA-C 01/28/2020, 9:18 AM  [employer] Pager: [phone]  [name], MD 01/28/2020, 3:26 PM

## 2020-01-28 NOTE — ED Notes (Signed)
Pt transfer from chair to bathroom, with minimal assistance. He also had a lower back, left side drainage, color yellow. RN notified

## 2020-01-28 NOTE — ED Notes (Signed)
Rec'vd pt from [facility] when I floated to the unit at 1100. Melissa stated that pt only had 22 G IV in L thumb and that labs were not drawn d/t poor peripheral access. IV team was consulted and messaged. IV team came and started 2nd peripheral line in R arm. At this time they were able to draw 10cc of blood to be placed in various tubes for labs. However, not enough for blood cultures. Phlebotomy is not here today and Vancomycin had already been hung previously.

## 2020-01-28 NOTE — ED Notes (Signed)
Drsg saturated on LL Flank and half removed. I removed partial drsg and applied guaze, abd pad and tape. Pt was also given scheduled pain meds prior to.

## 2020-01-28 NOTE — Progress Notes (Signed)
Subjective:   Patient complaining this morning of pain to the abscess on his L thigh, to his R chest over area of ecchymosis. Also notes increased drainage from sinus tract after nephrostomy tube on L flank. Nosebleed has ceased since last night, though he still has some clots in his mouth.  Objective:  Vital signs in last 24 hours: Vitals:   01/27/20 2151 01/28/20 0050 01/28/20 0520 01/28/20 0645  BP:  128/68 126/69 109/60  Pulse:  71 72 70  Resp:  '20 16 15  ' Temp: 99.2 F (37.3 C) 99.2 F (37.3 C)    TempSrc: Oral     SpO2:  99% 100% 97%    Physical Exam Constitutional: no acute distress, non-toxic appearing Head: atraumatic ENT: external ears normal, R nare with blood soaked tissue but no active bleeding, left nare with some septal thinning Eyes: EOMI Cardiovascular: regular rate and rhythm, referred bruit, unable to detect other abnormal heart sounds Pulmonary: effort normal, lungs clear to ascultation bilaterally Abdominal: flat, nontender, no rebound tenderness, bowel sounds normal Musculoskeletal: 1cm tender abscess in L thigh near location of graft, moderate amount of milky sanguinous drainage from L flank at site of sinus tract from nephrostomy tube Skin: warm and dry, ecchymosis and tenderness noted to R chest Neurological: alert, no focal deficit Psychiatric: anxious  Assessment/Plan: [name] is a 55 y.o. male with hx of ESRD on MWF dialysis, HTN, CAD, severe aortic stenosis, spina bifida, chronic opioid use, L knee septic arthritris presenting with epistaxis, abscess near L thigh fistula, and malodorous discharge from chronic wound due to prior L nephrostomy tube.   Active Problems:   Abscess of left thigh  Epistaxis  Spontaneous Ecchymosis Oropharyngeal Petechiae   Patient presenting with profound epistaxis that failed conservative treatment, although patient continues to blow his nose which is likely exacerbating the bleeding. Also noted to have  ecchymosis on bilateral chest without known trauma, and oropharyngeal petechiae. Platelets were okay at 109,000. Checking labs for bleeding disorder. He is noted to be on full strength ASA and Plavix at home. Also gets heparin at dialysis, but low concern for HIT given normal PT, INT, and PTT. Uremia also elevated with his ESRD, so could be an element of platelet dysfunction. Also checking labs to rule out vasculitis. -Instructed patient to avoid blowing his nose or irritating the area - TXA nasal packing by EDP -f/u ESR, CRP, ANCA, mpo, HepC, C4, C3 -f/u vWF panel -f/u smear review -daily CBC  Left Thigh Abscess Fluid collection around non-functional dialysis graft Presented with Small abscess of the left thigh, however it is overlying the major vasculature of his leg, previously had dialysis access there through a graft which is no longer functioning.  Ultrasound also demonstrated a 4 x 2 cm fluid collection surrounding the graft.  History of left thigh wound which was infected in October 2021, had a graft in a location removed.  Cultures grew corynebacterium stratum, treated with 4 weeks of vancomycin and ceftazidime. - Vascular surgery consultation - Vancomycin per pharmacy  - Stop cefepime - Dilaudid 1 mg IV for breakthrough pain  Left flank sinus tract  Several month history of drainage from previous left nephrostomy tube placed.  He had a CT renal study in October 2021 that did show a sinus tract leading to the skin, however no evidence of abscess.  Given that the drainage quality has become more copious, yellow, and malodorous with development of erythema and pain, I am concerned that there is  a new infection of this area.  Antimicrobials started by ED provider. - urology consulted, appreciate recommendations - f/u CT  - Vancomycin per pharmacy  ESRD on MWF dialysis Impaired dialysis access Hyperkalemia Patient is well-known to the vascular surgery department and has exhausted  most of his access sites. Had previous access to upper extremities and in the left lower extremity, now has a right femoral tunneled dialysis catheter. Has had adequate flow recently, at recent admission required Cathflo treatments.  -nephro consulted for HD, normally goes MWF -Continue home PhosLo -Daily BMP -Nephro diet -Avoid nephrotoxins -Kayecelate/Lokelma as needed  Chronic pain syndrome -continue home oxycodone 42m q6hr prn  Non-obstructive CAD -holding Plavix and ASA due to acute epistaxis  Diet:  renal IVF:  none VTE:  SCDs Prior to Admission Living Arrangement:  home Anticipated Discharge Location:  home Barriers to Discharge:  Workup of abscess around L thigh graft  Dispo: Anticipated discharge in approximately 3-5 day(s).   CAndrew Au MD 01/28/2020, 8:11 AM Pager: [phone] 5pm on weekdays and 1pm on weekends: On Call pager [phone]

## 2020-01-28 NOTE — ED Provider Notes (Signed)
[facility] DEPARTMENT Provider Note   CSN: [id number] Arrival date & time: 01/27/20  1525     History No chief complaint on file.   [name] is a 55 y.o. male with multiple chronic medical conditions as listed below who presents to the emergency department with a chief complaint of abscess.  The patient reports that he was sent by his PCP after he developed an abscess to his left thigh 24 hours ago.  He reports that he has been intermittently having fevers for the last 2 weeks, including during his last hospitalization.  He was discharged on January 14.  He also has noticed malodorous discharge from a wound on his left mid back that has been present for several months.  He reports that the wound had almost closed up until the last few weeks when the drainage increased, became more purulent and brown, and foul-smelling.  His home health nurse will change the dressing once a week, but he has been changing it the rest of the time.  He also endorses worsening pain in the area.  He also developed bilateral epistaxis approximately 12 hours prior to arrival.  He has been able to get the bleeding to stop several times, but has had large clots noted in nostrils that seem to "go to the back of his throat" and make him feel like he is choking so he will blow his nose in the area will start bleeding again.   He also endorses some right-sided chest wall pain since yesterday.  Pain is worse with pressure on the area.  Improved with not applying pressure to the area.  Pain is also worse with movement.  No other known aggravating or alleviating factors.  The intensity of the pain is causing him to feel short of breath.  He denies palpitations, leg swelling, vomiting, diarrhea, dysuria, hematuria, bleeding from the gums, melena, hematochezia.  He only received 2.5 hours of his dialysis session 2 days ago and his last full dialysis session was 1/21.   The history is  provided by the patient and medical records. No language interpreter was used.       Past Medical History:  Diagnosis Date  . Anemia   . Anxiety   . Clotted dialysis access ([place]) 01/06/2020  . Constipation   . End stage renal failure on dialysis [facility])    [place]; MWF, on HD since 1997, exhausted upper ext access, and possibly LE access; cath dependent in R groin as of 06/2018  . GERD (gastroesophageal reflux disease)   . Grand mal seizure ([place]) X 4   last 1998 (02/29/2016)  . History of blood transfusion 2000s   "I was having blood loss; never found out from where"  . History of kidney stones   . Hypertension    hx of - has not taken bp meds in over 2 years  since dialysis  . Hypothyroidism   . Insomnia   . Migraine    "due to my BP in my late 1990s; none since" (02/29/2016)  . Nonhealing surgical wound    of the left arteriovenous graft  . Severe aortic stenosis    no problems with per pt. - nephrologist and Dr. Coletta Memos  . Spina bifida [facility])     Patient Active Problem List   Diagnosis Date Noted  . Acute gout of right knee 12/10/2019  . Chest pressure   . Coronary artery calcification   . Chest pain   . Sepsis  without acute organ dysfunction ([place])   . Vascular graft infection ([place])   . Wound infection   . Pyogenic arthritis of left knee joint ([place])   . Pain and swelling of knee, left   . Complication associated with dialysis catheter   . Hyperkalemia   . Dysphagia   . Ureteral stone with hydronephrosis 03/26/2019  . Malfunction of nephrostomy tube ([place])   . Nephrostomy complication ([place]) [id number]  . Abnormal CT scan, gastrointestinal tract   . Flank pain 11/26/2018  . Nephrolithiasis 11/26/2018  . Hydronephrosis with renal and ureteral calculus obstruction 11/26/2018  . Left flank pain 11/26/2018  . ESRD on dialysis ([place]) 06/12/2018  . PAD (peripheral artery disease) ([place]) 12/22/2017  . Problem with dialysis access ([place]) 12/21/2017  . Anemia 12/21/2017  .  Hypothyroidism 12/21/2017  . GERD (gastroesophageal reflux disease) 12/21/2017  . Anxiety 12/21/2017  . Left great toe amputee ([place]) 12/19/2017  . Endocarditis of mitral valve   . Osteomyelitis of toe of left foot ([place])   . MSSA bacteremia 12/08/2017  . Fever   . Right flank pain   . ESRD (end stage renal disease) ([place]) 09/20/2017  . ESRD (end stage renal disease) on dialysis ([place]) 08/07/2017  . Nonhealing surgical wound 02/29/2016  . Clotted dialysis access ([place]) 01/14/2016  . Removal of staples 07/19/2012  . Complication of AV dialysis fistula 06/27/2012  . End stage renal disease ([place]) 06/27/2012    Past Surgical History:  Procedure Laterality Date  . AMPUTATION Left 12/11/2017   Procedure: Left Great Toe Amputation;  Surgeon: [name], MD;  Location: [place];  Service: Orthopedics;  Laterality: Left;  . APPENDECTOMY    . APPLICATION OF WOUND VAC Left 10/04/2019   Procedure: APPLICATION OF WOUND VAC;  Surgeon: Serafina Mitchell, MD;  Location: [place];  Service: Vascular;  Laterality: Left;  . ARTERIOVENOUS GRAFT PLACEMENT Left 10/16/2012   left femoral goretex graft         Dr Donnetta Hutching  . AV FISTULA PLACEMENT Left 06/27/2012   Procedure: EXPLORATORY LEFT THY-GRAFT PSEUDO-ANEURYSM;  Surgeon: Conrad [place], MD;  Location: [place];  Service: Vascular;  Laterality: Left;  Revision of left Arteriovenus gortex graft in thigh.  Bertram Savin REMOVAL Left 10/04/2019   Procedure: REMOVAL OF ARTERIOVENOUS GORETEX GRAFT ([place]);  Surgeon: Serafina Mitchell, MD;  Location: [facility] OR;  Service: Vascular;  Laterality: Left;  . COLONOSCOPY    . DRESSING CHANGE UNDER ANESTHESIA Left 10/07/2019   Procedure: DRESSING CHANGE UNDER ANESTHESIA;  Surgeon: Meredith Pel, MD;  Location: [place];  Service: Orthopedics;  Laterality: Left;  . FLEXIBLE SIGMOIDOSCOPY N/A 12/03/2018   Procedure: FLEXIBLE SIGMOIDOSCOPY;  Surgeon: Yetta Flock, MD;  Location: [place];  Service: Gastroenterology;  Laterality: N/A;   . I & D EXTREMITY Left 02/29/2016   Procedure: DEBRIDEMENT LEFT THIGH WOUND;  Surgeon: Waynetta Sandy, MD;  Location: [place];  Service: Vascular;  Laterality: Left;  . I & D EXTREMITY Left 10/04/2019   Procedure: IRRIGATION AND DEBRIDEMENT LEFT THIGH;  Surgeon: Serafina Mitchell, MD;  Location: [place];  Service: Vascular;  Laterality: Left;  . I & D EXTREMITY Right 12/09/2019   Procedure: IRRIGATION AND DEBRIDEMENT EXTREMITY;  Surgeon: Altamese Laflin, MD;  Location: [place];  Service: Orthopedics;  Laterality: Right;  . ILEOSTOMY  1970s  . INCISION AND DRAINAGE Left 10/16/2012   Procedure: INCISION AND Debridement left thigh graft;  Surgeon: Rosetta Posner, MD;  Location: [place];  Service: Vascular;  Laterality: Left;  . INSERTION OF DIALYSIS CATHETER    . INSERTION OF DIALYSIS CATHETER  01/14/2016   Procedure: INSERTION OF DIALYSIS CATHETER;  Surgeon: Waynetta Sandy, MD;  Location: [place];  Service: Vascular;;  . INSERTION OF DIALYSIS CATHETER Right 08/07/2017   Procedure: INSERTION OF DIALYSIS CATHETER;  Surgeon: Waynetta Sandy, MD;  Location: [place];  Service: Vascular;  Laterality: Right;  . INSERTION OF DIALYSIS CATHETER Right 12/21/2017   Procedure: RIGHT FEMORAL DIALYSIS CATHETER EXCHANGE;  Surgeon: Marty Heck, MD;  Location: [place];  Service: Vascular;  Laterality: Right;  . INSERTION OF ILIAC STENT Left 01/14/2016   Procedure: INSERTION OF ILIAC STENT;  Surgeon: Waynetta Sandy, MD;  Location: [place];  Service: Vascular;  Laterality: Left;  . INTRAOPERATIVE ARTERIOGRAM Left 01/14/2016   Procedure: INTRA OPERATIVE ARTERIOGRAM;  Surgeon: Waynetta Sandy, MD;  Location: [place];  Service: Vascular;  Laterality: Left;  . IR NEPHROSTOMY EXCHANGE LEFT  03/21/2019  . IR NEPHROSTOMY PLACEMENT LEFT  11/28/2018  . IRRIGATION AND DEBRIDEMENT EXTREMITY (Right   12/09/2019  . KNEE ARTHROSCOPY Left 10/07/2019   Procedure: ARTHROSCOPY KNEE w/ STIMULATING  BEAD  PLACMENT;  Surgeon: Meredith Pel, MD;  Location: [place];  Service: Orthopedics;  Laterality: Left;  . LEFT HEART CATH AND CORONARY ANGIOGRAPHY N/A 10/17/2019   Procedure: LEFT HEART CATH AND CORONARY ANGIOGRAPHY;  Surgeon: Jettie Booze, MD;  Location: [place] CV LAB;  Service: Cardiovascular;  Laterality: N/A;  . LITHOTRIPSY  x 3   "& a laser treatment" (02/29/2016)  . NEPHROLITHOTOMY Left 03/26/2019   Procedure: NEPHROLITHOTOMY PERCUTANEOUS;  Surgeon: Alexis Frock, MD;  Location: [facility];  Service: Urology;  Laterality: Left;  2 HRS  . NEPHROSTOMY Bilateral 1968  . PATELLAR TENDON REPAIR Right 1990s   "big incision"  . PERIPHERAL VASCULAR CATHETERIZATION  01/14/2016   Procedure: A/V SHUNTOGRAM;  Surgeon: Waynetta Sandy, MD;  Location: [place];  Service: Vascular;;  . REVISION OF ARTERIOVENOUS GORETEX GRAFT Left 10/16/2012   Procedure: REVISION OF LEFT FEMORAL LOOP ARTERIOVENOUS GORETEX GRAFT;  Surgeon: Rosetta Posner, MD;  Location: [place];  Service: Vascular;  Laterality: Left;  . REVISION OF ARTERIOVENOUS GORETEX GRAFT Left 02/11/2015   Procedure: EXCISION OF SMALL SEGMENT OF EXPOSED LEFT THIGH NON FUNCTIONING  ARTERIOVENOUS GORETEX GRAFT;  Surgeon: Mal Misty, MD;  Location: [place];  Service: Vascular;  Laterality: Left;  . REVISION OF ARTERIOVENOUS GORETEX GRAFT Left 01/14/2016   Procedure: REVISION OF ARTERIOVENOUS GORETEX GRAFT;  Surgeon: Waynetta Sandy, MD;  Location: [place];  Service: Vascular;  Laterality: Left;  . REVISION OF ARTERIOVENOUS GORETEX GRAFT Left 02/29/2016   thigh/pt report  . REVISION OF ARTERIOVENOUS GORETEX GRAFT Left 02/29/2016   Procedure: POSSIBLE REVISION OF LEFT THIGH ARTERIOVENOUS GORETEX GRAFT;  Surgeon: Waynetta Sandy, MD;  Location: [place];  Service: Vascular;  Laterality: Left;  . TEE WITHOUT CARDIOVERSION N/A 12/14/2017   Procedure: TRANSESOPHAGEAL ECHOCARDIOGRAM (TEE);  Surgeon: Acie Fredrickson, Wonda Cheng, MD;  Location: [place];  Service: Cardiovascular;  Laterality: N/A;  . THROMBECTOMY AND REVISION OF ARTERIOVENTOUS (AV) GORETEX  GRAFT Left 06/12/2018   Procedure: Removal of infected left thigh graft;  Surgeon: Rosetta Posner, MD;  Location: [place];  Service: Vascular;  Laterality: Left;  . THROMBECTOMY W/ EMBOLECTOMY Left 01/14/2016   Procedure: THROMBECTOMY ARTERIOVENOUS GORE-TEX Left thigh GRAFT;  Surgeon: Waynetta Sandy, MD;  Location: [place];  Service: Vascular;  Laterality:  Left;  . TONGUE SURGERY  ~ 1990   tongue-tie release   . ULTRASOUND GUIDANCE FOR VASCULAR ACCESS Right 08/07/2017   Procedure: ULTRASOUND GUIDANCE FOR VASCULAR CANNULATION RIGHT FEMORAL VEIN AND LEFT AV FEMORAL GRAFT;  Surgeon: Waynetta Sandy, MD;  Location: [place];  Service: Vascular;  Laterality: Right;  . UPPER EXTREMITY VENOGRAPHY Bilateral 08/09/2017   Procedure: UPPER EXTREMITY VENOGRAPHY;  Surgeon: Serafina Mitchell, MD;  Location: [place] CV LAB;  Service: Cardiovascular;  Laterality: Bilateral;  . VENOGRAM N/A 09/20/2017   Procedure: RIGHT COMMON FEMORAL ARTERY EXPLORATION.  CANNULATION RIGHT COMMON FEMORAL VEIN. VENOGRAM CENTRAL ULTRA SOUND GUIDED RIGHT FEMORAL VEIN TIMES TWO.;  Surgeon: Waynetta Sandy, MD;  Location: [place];  Service: Vascular;  Laterality: N/A;  . VENOGRAM Right 10/04/2019   Procedure: VENOGRAM;  Surgeon: Serafina Mitchell, MD;  Location: [place];  Service: Vascular;  Laterality: Right;  . WOUND DEBRIDEMENT Left 02/29/2016   thigh       Family History  Problem Relation Age of Onset  . Diabetes Mother   . Hypertension Mother   . Heart disease Mother        before age 46  . Diabetes Father   . Heart attack Father        X's 3  . Diabetes Sister   . Bipolar disorder Sister     Social History   Tobacco Use  . Smoking status: Never Smoker  . Smokeless tobacco: Never Used  Vaping Use  . Vaping Use: Never used  Substance Use Topics  . Alcohol use: Not Currently     Alcohol/week: 0.0 standard drinks    Comment: 02/29/2016 "nothing since  the mid 1990s  . Drug use: Not Currently    Types: Marijuana    Comment: 02/29/2016 "nothing since ~ 1995"    Home Medications Prior to Admission medications   Medication Sig Start Date End Date Taking? Authorizing Provider  acetaminophen (TYLENOL) 500 MG tablet Take 1,000 mg by mouth every 6 (six) hours as needed for mild pain or headache.    [provider]  aspirin 325 MG tablet Take 325 mg by mouth daily.    [provider]  calcium acetate (PHOSLO) 667 MG capsule Take 3-4 capsules (2,001-2,668 mg total) by mouth See admin instructions. Take 2708 mg by mouth 3 times daily with meals, take 2001 mg by mouth with snacks Patient taking differently: Take 2,668 mg by mouth 3 (three) times daily with meals. 01/14/18   Hennie Duos, MD  clopidogrel (PLAVIX) 75 MG tablet Take 75 mg by mouth daily.    [provider]  diclofenac Sodium (VOLTAREN) 1 % GEL Apply 2 g topically daily as needed (For leg pain).    [provider]  famotidine (PEPCID) 20 MG tablet Take 1 tablet (20 mg total) by mouth daily. Take after dialysis 01/16/20   [name], MD  fluticasone [facility]) 50 MCG/ACT nasal spray Place 2 sprays into both nostrils as needed for allergies or rhinitis (congestion). Patient taking differently: Place 2 sprays into both nostrils daily as needed for allergies or rhinitis (congestion). 01/14/18   Hennie Duos, MD  furosemide (LASIX) 80 MG tablet Take 80 mg by mouth 2 (two) times daily as needed for fluid.     [provider]  levothyroxine (SYNTHROID) 137 MCG tablet Take 137 mcg by mouth daily before breakfast.    [provider]  LORazepam (ATIVAN) 1 MG tablet Take 1 mg by mouth  daily.    [provider]  Nutritional Supplements (NEPRO) LIQD Take 237 mLs by mouth daily. Mixed Berry    [provider]  oxyCODONE-acetaminophen (PERCOCET) 10-325  MG tablet Take 1 tablet by mouth every 6 (six) hours as needed for pain. Patient taking differently: Take 1 tablet by mouth every 6 (six) hours. 06/14/18   Dagoberto Ligas, PA-C  promethazine (PHENERGAN) 25 MG tablet Take 1 tablet (25 mg total) by mouth 2 (two) times daily as needed for nausea or vomiting. 01/14/18   Hennie Duos, MD  rosuvastatin (CRESTOR) 10 MG tablet Take 1 tablet (10 mg total) by mouth daily. 10/18/19 12/09/19  Lyndee Hensen, DO  sodium polystyrene (KAYEXALATE) powder Take 15 g by mouth See admin instructions. Take 15 g (mixed in water) by mouth on Saturdays and Sundays 01/14/18   Hennie Duos, MD  traZODone (DESYREL) 100 MG tablet Take 1 tablet (100 mg total) by mouth at bedtime. 01/14/18   Hennie Duos, MD  zolpidem (AMBIEN) 10 MG tablet Take 1 tablet (10 mg total) by mouth at bedtime. 01/14/18   Hennie Duos, MD    Allergies    Hydrocodone, Furadantin [nitrofurantoin], Mandelamine [methenamine], Noroxin [norfloxacin], Colace [docusate], Elemental sulfur, and Sulfa antibiotics  Review of Systems   Review of Systems  Constitutional: Positive for fever. Negative for appetite change and diaphoresis.  HENT: Positive for nosebleeds. Negative for congestion.   Respiratory: Negative for cough, shortness of breath and wheezing.   Cardiovascular: Positive for chest pain.  Gastrointestinal: Negative for abdominal pain, diarrhea, nausea and vomiting.  Genitourinary: Negative for dysuria.  Musculoskeletal: Negative for back pain, myalgias, neck pain and neck stiffness.  Skin: Positive for color change, rash and wound.  Allergic/Immunologic: Negative for immunocompromised state.  Neurological: Negative for dizziness, weakness, numbness and headaches.  Psychiatric/Behavioral: Negative for confusion.    Physical Exam Updated Vital Signs BP 128/68   Pulse 71   Temp 99.2 F (37.3 C)   Resp 20   SpO2 99%   Physical Exam Vitals and nursing note reviewed.   Constitutional:      Appearance: He is well-developed. He is not toxic-appearing.     Comments: Chronically ill-appearing Nontoxic.  No acute distress.  HENT:     Head: Normocephalic.     Comments: Petechiae noted to the palate No blood in the posterior oropharynx    Nose:     Right Nostril: Epistaxis present. No septal hematoma.     Left Nostril: Epistaxis present. No septal hematoma.     Comments: Mild deterioration to the nasal septum on the left Eyes:     Conjunctiva/sclera: Conjunctivae normal.     Pupils: Pupils are equal, round, and reactive to light.  Cardiovascular:     Rate and Rhythm: Normal rate and regular rhythm.     Pulses: Normal pulses.     Heart sounds: Normal heart sounds. No murmur heard. No friction rub. No gallop.   Pulmonary:     Effort: Pulmonary effort is normal. No respiratory distress.     Breath sounds: No stridor. No wheezing, rhonchi or rales.     Comments: Tender to palpation to the right lateral chest wall overlying an area where there is a very large, oval-shaped area of ecchymosis.  Please see photo below.  No crepitus or step-off. Chest:     Chest wall: Tenderness present.  Abdominal:     General: There is no distension.     Palpations: Abdomen is soft.  Comments: Multiple large areas of ecchymosis noted to the bilateral lower abdomen  Musculoskeletal:     Cervical back: Neck supple.     Right lower leg: No edema.     Left lower leg: No edema.     Comments: Right femoral dialysis catheter is in place There is a 1 cm abscess with minimal surrounding cellulitis noted to the left proximal thigh near the inguinal fold  Skin:    General: Skin is warm and dry.     Findings: Bruising and rash present.     Comments: Malodorous, purulent discharge noted from a left flank/mid back wound with associated tenderness to palpation  Neurological:     Mental Status: He is alert.     Comments: Alert and oriented x4.  Psychiatric:        Behavior:  Behavior normal.    Left proximal, anterior thigh Left flank/back   Right lateral chest wall     ED Results / Procedures / Treatments   Labs (all labs ordered are listed, but only abnormal results are displayed) Labs Reviewed  BASIC METABOLIC PANEL - Abnormal; Notable for the following components:      Result Value   Potassium 5.7 (*)    Chloride 94 (*)    BUN 64 (*)    Creatinine, Ser 10.35 (*)    Calcium 8.3 (*)    GFR, Estimated 5 (*)    Anion gap 16 (*)    All other components within normal limits  CBC - Abnormal; Notable for the following components:   RBC 2.93 (*)    Hemoglobin 8.3 (*)    HCT 28.6 (*)    MCHC 29.0 (*)    RDW 17.9 (*)    All other components within normal limits  TROPONIN I (HIGH SENSITIVITY) - Abnormal; Notable for the following components:   Troponin I (High Sensitivity) 23 (*)    All other components within normal limits  TROPONIN I (HIGH SENSITIVITY) - Abnormal; Notable for the following components:   Troponin I (High Sensitivity) 28 (*)    All other components within normal limits  CULTURE, BLOOD (ROUTINE X 2)  CULTURE, BLOOD (ROUTINE X 2)  AEROBIC CULTURE (SUPERFICIAL SPECIMEN)  CBC WITH DIFFERENTIAL/PLATELET  PROTIME-INR  PLATELET FUNCTION ASSAY  APTT    EKG None  Radiology DG Chest 2 View  Result Date: 01/27/2020 CLINICAL DATA:  55 year old male with chest pain. EXAM: CHEST - 2 VIEW COMPARISON:  Chest radiograph dated 12/09/2019. FINDINGS: Mild cardiomegaly and mild vascular congestion. No focal consolidation, pleural effusion or pneumothorax. No acute osseous pathology. Partially visualized abdominal vascular catheter on the lateral view. IMPRESSION: Mild cardiomegaly and mild vascular congestion. Electronically Signed   By: Anner Crete M.D.   On: 01/27/2020 16:31    Procedures .Critical Care Performed by: Joanne Gavel, PA-C Authorized by: Joanne Gavel, PA-C   Critical care provider statement:    Critical care  time was exclusive of:  Separately billable procedures and treating other patients and teaching time   Critical care was necessary to treat or prevent imminent or life-threatening deterioration of the following conditions:  Metabolic crisis   Critical care was time spent personally by me on the following activities:  Ordering and performing treatments and interventions, ordering and review of laboratory studies, ordering and review of radiographic studies, pulse oximetry, re-evaluation of patient's condition, review of old charts, examination of patient, evaluation of patient's response to treatment, development of treatment plan with patient or surrogate and  obtaining history from patient or surrogate   I assumed direction of critical care for this patient from another provider in my specialty: no       Medications Ordered in ED Medications  sodium zirconium cyclosilicate (LOKELMA) packet 10 g (10 g Oral Not Given 01/28/20 0317)  HYDROmorphone (DILAUDID) injection 1 mg (has no administration in time range)  ondansetron (ZOFRAN) injection 4 mg (has no administration in time range)  tranexamic acid (CYKLOKAPRON) 1000 MG/10ML topical solution 500 mg (has no administration in time range)  vancomycin (VANCOCIN) 2,500 mg in sodium chloride 0.9 % 500 mL IVPB (has no administration in time range)  ceFEPIme (MAXIPIME) 2 g in sodium chloride 0.9 % 100 mL IVPB (has no administration in time range)  ondansetron (ZOFRAN-ODT) disintegrating tablet 4 mg (has no administration in time range)  HYDROmorphone (DILAUDID) injection 1 mg (has no administration in time range)    ED Course  I have reviewed the triage vital signs and the nursing notes.  Pertinent labs & imaging results that were available during my care of the patient were reviewed by me and considered in my medical decision making (see chart for details).    MDM Rules/Calculators/A&P                          55 year old male with multiple  chronic medical conditions who presents with multiple complaints.  He was sent by his primary care physician for a new abscess on his left thigh that developed over the last 24 hours.  He has a history of previous wounds that have significantly worsened and subsequently developed bacteremia.  He has been having intermittent fevers for the last 2 weeks.  He also notes that the wound on his left mid back that was secondary to a nephrostomy tube was improving, but began to have malodorous discharge over the last few weeks.   Also, the patient developed epistaxis today and was complaining of chest pain that was initially thought to be secondary to the patient having missed dialysis sessions over the last few days, but actually appeared to be secondary to a large atraumatic area of ecchymosis on his right chest wall.  Vital signs are stable.  Afebrile in the ER.  Labs and imaging have been reviewed and independently interpreted by me.  On exam, he does have a small abscess noted to the left proximal thigh.  I&D was offered in the ER, which he declined at this time.  I am concerned that the left mid back wound is also acutely infected.  Wound culture was obtained.  Blood cultures x2 collected prior to antibiotic administration.  Will start on cefepime and vancomycin since patient has been having fevers.  No leukocytosis today.  Regarding his epistaxis.  Bleeding will stop, but the patient has been blowing his nose multiple times and disrupting the clot formation.  TXA has been ordered from the lab.  Patient's bleeding is controlled at this time.  I am concerned about the large area of ecchymosis on his right chest wall that is atraumatic.  He also has several areas of ecchymosis noted to the lower abdomen as well as scattered bruises to the arms and legs and petechiae on his palate.  Platelets unable to be estimated due to clumping.  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   Creatinine is elevated at 10.35.  He has  mild hyperkalemia of 5.7.  No other metabolic derangements.  The patient did bring his home Kayexalate with him and has taken a dose tonight.  I also gave him a dose of Lokelma.  His chest pain is reproducible on exam secondary to the area of ecchymosis.  He has no shortness of breath.  He will need to be seen by nephrology for dialysis after he is admitted.  Given concern for chest pain, troponin was ordered by triage.  Troponin is elevated, but trend is flat and at his baseline.  This is likely elevated secondary to his history of ESRD.  Consult to the internal medicine team who will accept the patient for admission. The patient appears reasonably stabilized for admission considering the current resources, flow, and capabilities available in the ED at this time, and I doubt any other [facility] requiring further screening and/or treatment in the ED prior to admission.   Final Clinical Impression(s) / ED Diagnoses Final diagnoses:  Abscess of left thigh  Acute anterior epistaxis  Complication of nephrostomy (HCC)  Platelet dysfunction (HCC)  Abnormal bruising    Rx / DC Orders ED Discharge Orders    None       Joanne Gavel, PA-C 01/28/20 0446    Veryl Speak, MD 01/28/20 [phone]

## 2020-01-29 LAB — RENAL FUNCTION PANEL
Albumin: 2.9 g/dL — ABNORMAL LOW (ref 3.5–5.0)
Anion gap: 18 — ABNORMAL HIGH (ref 5–15)
BUN: 86 mg/dL — ABNORMAL HIGH (ref 6–20)
CO2: 24 mmol/L (ref 22–32)
Calcium: 8.2 mg/dL — ABNORMAL LOW (ref 8.9–10.3)
Chloride: 94 mmol/L — ABNORMAL LOW (ref 98–111)
Creatinine, Ser: 13 mg/dL — ABNORMAL HIGH (ref 0.61–1.24)
GFR, Estimated: 4 mL/min — ABNORMAL LOW (ref >60)
Glucose, Bld: 77 mg/dL (ref 70–99)
Phosphorus: 6.1 mg/dL — ABNORMAL HIGH (ref 2.5–4.6)
Potassium: 5.8 mmol/L — ABNORMAL HIGH (ref 3.5–5.1)
Sodium: 136 mmol/L (ref 135–145)

## 2020-01-29 LAB — CBC
HCT: 25.4 % — ABNORMAL LOW (ref 39.0–52.0)
Hemoglobin: 7.5 g/dL — ABNORMAL LOW (ref 13.0–17.0)
MCH: 27.5 pg (ref 26.0–34.0)
MCHC: 29.5 g/dL — ABNORMAL LOW (ref 30.0–36.0)
MCV: 93 fL (ref 80.0–100.0)
Platelets: 112 10*3/uL — ABNORMAL LOW (ref 150–400)
RBC: 2.73 MIL/uL — ABNORMAL LOW (ref 4.22–5.81)
RDW: 17.5 % — ABNORMAL HIGH (ref 11.5–15.5)
WBC: 5.7 10*3/uL (ref 4.0–10.5)
nRBC: 0 % (ref 0.0–0.2)

## 2020-01-29 LAB — PLATELET FUNCTION ASSAY
Collagen / ADP: >300 seconds — ABNORMAL HIGH (ref 0–118)
Collagen / Epinephrine: 267 seconds — ABNORMAL HIGH (ref 0–193)

## 2020-01-29 LAB — BASIC METABOLIC PANEL
Anion gap: 18 — ABNORMAL HIGH (ref 5–15)
BUN: 81 mg/dL — ABNORMAL HIGH (ref 6–20)
CO2: 27 mmol/L (ref 22–32)
Calcium: 8.5 mg/dL — ABNORMAL LOW (ref 8.9–10.3)
Chloride: 91 mmol/L — ABNORMAL LOW (ref 98–111)
Creatinine, Ser: 12.53 mg/dL — ABNORMAL HIGH (ref 0.61–1.24)
GFR, Estimated: 4 mL/min — ABNORMAL LOW (ref >60)
Glucose, Bld: 136 mg/dL — ABNORMAL HIGH (ref 70–99)
Potassium: 5.6 mmol/L — ABNORMAL HIGH (ref 3.5–5.1)
Sodium: 136 mmol/L (ref 135–145)

## 2020-01-29 LAB — HEPATITIS C ANTIBODY: HCV Ab: NONREACTIVE

## 2020-01-29 LAB — HEPATITIS B SURFACE ANTIGEN: Hepatitis B Surface Ag: NONREACTIVE

## 2020-01-29 LAB — SURGICAL PCR SCREEN
MRSA, PCR: NEGATIVE
Staphylococcus aureus: NEGATIVE

## 2020-01-29 LAB — GLUCOSE, CAPILLARY
Glucose-Capillary: 67 mg/dL — ABNORMAL LOW (ref 70–99)
Glucose-Capillary: 105 mg/dL — ABNORMAL HIGH (ref 70–99)
Glucose-Capillary: 110 mg/dL — ABNORMAL HIGH (ref 70–99)

## 2020-01-29 MED ORDER — ALTEPLASE 2 MG IJ SOLR
INTRAMUSCULAR | Status: AC
  Administered 2020-01-29: 2 mg
  Filled 2020-01-29: qty 4

## 2020-01-29 MED ORDER — HYDROMORPHONE HCL 1 MG/ML IJ SOLN
INTRAMUSCULAR | Status: AC
  Filled 2020-01-29: qty 1

## 2020-01-29 MED ORDER — DOXERCALCIFEROL 4 MCG/2ML IV SOLN
INTRAVENOUS | Status: AC
  Filled 2020-01-29: qty 2

## 2020-01-29 MED ORDER — DEXTROSE 50 % IV SOLN
INTRAVENOUS | Status: AC
  Administered 2020-01-29: 50 mL
  Filled 2020-01-29: qty 50

## 2020-01-29 MED ORDER — HYDROMORPHONE HCL 1 MG/ML IJ SOLN
1.0000 mg | Freq: Once | INTRAMUSCULAR | Status: AC
  Administered 2020-01-29: 1 mg via INTRAVENOUS
  Filled 2020-01-29: qty 1

## 2020-01-29 MED ORDER — VANCOMYCIN HCL IN DEXTROSE 1-5 GM/200ML-% IV SOLN
INTRAVENOUS | Status: AC
  Filled 2020-01-29: qty 200

## 2020-01-29 NOTE — Progress Notes (Signed)
Floor RN, Eustaquio Maize, called stating nephrology wanted patient to go to dialysis prior to surgery this am. Dr. Trula Slade made aware and stated he would have to cancel the case today. OR desk and floor RN made aware.

## 2020-01-29 NOTE — Progress Notes (Signed)
[facility] Progress Note  Subjective: 3 L off w/ HD today  Vitals:   01/29/20 1219 01/29/20 1234 01/29/20 1237 01/29/20 1242  BP: 114/69 115/70 116/78 126/74  Pulse:   67 67  Resp:      Temp:    98.9 F (37.2 C)  TempSrc:    Oral  SpO2:   100%   Weight:   119.4 kg     Exam: General: alert, not in distress Neck: no jvd Lungs: c;ear bilat, no rales, R base dc'd Heart: RRR soft sem, no S3 Abdomen: soft ntnd, L flank w/ dried blood at former PCN site Musculoskeletal: L groin boil / abscess w/o drainage , L medial groin, old AVG palpable in the L lower thigh position Lower extremities: no edema Neuro: A&Ox 3 Dialysis Access:  R thigh TDC    OP HD: AF MWF   4h 33mn  350/800   118kg  2/2.25 bath  Hep 3700  R thigh TDC Sensipar 30 mg q HD Hectorol 4 mcg q HD Mircera 225 mcg IV q 2 weeks- last dose 200 mcg on 1/17, due on 1/31   Assessment/ Plan: 1.  Thigh abscess: over site of prior AVG. He has had multiple infections in this area before. Seen by VVS, plan is to take to OR. Per VVS. On IVabx.  2. L flank sinus tract: from site of previous nephrostomy tube. On antibiotics as above.  3.  ESRD:  MWF schedule, K+ slighlt elevated on arrival which is a chronic issue for this patient. He was given kayexalate and lokelma. Will get HD today off schedule.  4.  Hypertension/volume: BP controlled. EDw raised recently. UF as tolerated, follow weights.  5.  Anemia: Hgb has been running low since last admission. Not due for ESA yet. Will hold heparin given recent nose bleed 6.  Metabolic bone disease: Continue sensipar, hectorol and binders. Follow phosphorus/calcium.    Rob Lynisha Osuch 01/29/2020, 1:42 PM   Recent Labs  Lab 01/28/20 0523 01/28/20 1324 01/28/20 2330 01/29/20 0625  K  --  5.5* 5.6* 5.8*  BUN  --  74* 81* 86*  CREATININE  --  11.49* 12.53* 13.00*  CALCIUM  --  8.4* 8.5* 8.2*  PHOS  --  5.2*  --  6.1*  HGB 7.9*  --   --  7.5*   Inpatient  medications: . calcium acetate  2,668 mg Oral TID WC  . Chlorhexidine Gluconate Cloth  6 each Topical Q0600  . cinacalcet  30 mg Oral Q M,W,F-HD  . doxercalciferol      . doxercalciferol  4 mcg Intravenous Q M,W,F-HD  . escitalopram  5 mg Oral Daily  . famotidine  40 mg Oral Daily  . HYDROmorphone      . levothyroxine  137 mcg Oral Q0600  . mupirocin ointment  1 application Nasal BID  . oxyCODONE-acetaminophen  1 tablet Oral Q6H   And  . oxyCODONE  5 mg Oral Q6H  . sodium chloride flush  3 mL Intravenous Q12H  . [START ON 01/31/2020] sodium polystyrene  15 g Oral Once per day on Sun Sat  . sodium zirconium cyclosilicate  10 g Oral Once  . traZODone  100 mg Oral QHS  . zolpidem  10 mg Oral QHS   . sodium chloride    . vancomycin    . vancomycin Stopped (01/29/20 1332)  . [START ON 01/30/2020] vancomycin     sodium chloride, fluticasone, HYDROmorphone (DILAUDID) injection, promethazine, sodium chloride  flush, sodium polystyrene

## 2020-01-29 NOTE — Progress Notes (Signed)
Subjective:   His arm is hurting a lot. He didn't sleep very well. Epistaxis remains resolved.  Initially planned for vascular surgery this morning, but felt that he needed dialysis first, so surgery has been rescheduled.  Objective:  Vital signs in last 24 hours: Vitals:   01/28/20 1059 01/28/20 1745 01/28/20 1900 01/28/20 2115  BP: 116/72 114/74  (!) 118/43  Pulse: 68 67 70 72  Resp: 14 (!) _0 Temp:    99 F (37.2 C)  TempSrc:    Oral  SpO2: 98% 100% 100%     Physical Exam Constitutional: no acute distress, non-toxic appearing Head: atraumatic ENT: external ears normal, R nare with dried blood but no active bleeding, left nare with some septal thinning Eyes: EOMI Cardiovascular: regular rate and rhythm, referred bruit, unable to detect other abnormal heart sounds Pulmonary: effort normal, lungs clear to ascultation bilaterally Abdominal: flat, nontender, no rebound tenderness, bowel sounds normal Musculoskeletal: 1cm tender abscess in L thigh near location of graft, moderate amount of milky sanguinous drainage from L flank at site of sinus tract from nephrostomy tube, ecchymosis to lateral right chest and exquisite tenderness to right shoulder down to mid arm with no clear skin or soft tissue changes Skin: warm and dry Neurological: alert, no focal deficit Psychiatric: anxious  Assessment/Plan: [name] is a 55 y.o. male with hx of ESRD on MWF dialysis, HTN, CAD, severe aortic stenosis, spina bifida, chronic opioid use, L knee septic arthritris presenting with epistaxis, abscess near L thigh fistula, and malodorous discharge from chronic wound due to prior L nephrostomy tube.   Principal Problem:   Abscess of left thigh Active Problems:   End stage renal disease (HCC)   PAD (peripheral artery disease) (HCC)   Nephrostomy complication (HCC)  Left Thigh Abscess Fluid around old non-functional dialysis graft of left thigh Presented with small abscess of  the left thigh which is overlying the major vasculature of his leg, previously had dialysis access there through a graft which is no longer functioning.  Ultrasound also demonstrated a deeper fluid collection surrounding the graft.  History of left thigh wound which was infected in October 2021, had a graft in a location removed.  Cultures grew corynebacterium stratum, treated with 4 weeks of vancomycin and ceftazidime. - Vascular surgery consultation, surgery planned tomorrow - Vancomycin per pharmacy  - Dilaudid 1 mg IV for breakthrough pain  Left flank sinus tract  Several month history of drainage from previous left nephrostomy tube placed.  He had a CT renal study in October 2021 that did show a sinus tract leading to the skin, however no evidence of abscess.    Repeat CT scan this admission shows the same with no fluid collection. - urology consulted, appreciate recommendations     -No need for urgent surgery, ultimate treatment will be a nephrectomy which patient does not wish to have.  Follow-up with his urologist, Dr. Tresa Moore, outpatient     -Dressing changes daily as needed     -Wound care consult - Vancomycin as above  ESRD on MWF dialysis Impaired dialysis access Hyperkalemia Patient is well-known to the vascular surgery department and has exhausted most of his access sites. Had previous access to upper extremities and in the left lower extremity, now has a right femoral tunneled dialysis catheter. Has had adequate flow recently, at recent admission required Cathflo treatments.  -nephro consulted for HD, normally goes MWF, at dialysis this morning -Continue home PhosLo -Daily BMP -Nephro  diet -Avoid nephrotoxins -Kayexalate/Lokelma as needed.  Brought his home Kayexalate which he prefers due to taste.  Epistaxis - resolved Spontaneous Ecchymosis Patient presenting with profound epistaxis that failed conservative treatment, although patient continues to blow his nose which is  likely exacerbating the bleeding. Also noted to have ecchymosis on bilateral chest without known trauma, and oropharyngeal petechiae. Platelets were okay at 109,000. Checking labs for bleeding disorder. He is noted to be on full strength ASA and Plavix at home. Also gets heparin at dialysis, but low concern for HIT given normal PT, INT, and PTT. Uremia also elevated with his ESRD, so could be an element of platelet dysfunction. Also checking labs to rule out vasculitis.  Mild elevations in ESR and CRP is nonspecific. -Instructed patient to avoid blowing his nose or irritating the area -TXA nasal packing by EDP -f/u ANCA, mpo, HepC, C4, C3 -f/u vWF panel -f/u smear review -daily CBC  Chronic pain syndrome -continue home oxycodone 30m q6hr prn  Non-obstructive CAD -holding Plavix and ASA due to acute epistaxis  Diet:  renal IVF:  none VTE:  SCDs Prior to Admission Living Arrangement:  home Anticipated Discharge Location:  home Barriers to Discharge:  Surgery complication of dialysis graft Dispo: Anticipated discharge in approximately 2-4 day(s).   CAndrew Au MD 01/29/2020, 6:00 AM Pager: [phone] 5pm on weekdays and 1pm on weekends: On Call pager [phone]

## 2020-01-29 NOTE — Consult Note (Signed)
WOC Nurse Consult Note: Patient receiving care in [facility] 2W23.  Consult completed remotely after review of record and note from Urologist Debbrah Alar from 01/28/20 at 1242. Reason for Consult: "L flank sinus tract draining, see urology note" Wound type: draining old nephrostomy tube site Pressure Injury POA: Yes/No/NA Measurement: Wound bed: Drainage (amount, consistency, odor)  Periwound: Dressing procedure/placement/frequency: Per Estill Bamberg Dancy's note on 01/28/20 at 1242, this is the recommended topical therapy approach for the area:  Keep area clean and dry with dry dressing changes daily and prn saturation.  This is what I have ordered.  Thank you for the consult. [place] nurse will not follow at this time.  Please re-consult the [place] team if needed.  Val Riles, RN, MSN, CWOCN, CNS-BC, pager [phone]

## 2020-01-30 ENCOUNTER — Encounter (HOSPITAL_COMMUNITY): Payer: Self-pay | Admitting: Internal Medicine

## 2020-01-30 ENCOUNTER — Inpatient Hospital Stay (HOSPITAL_COMMUNITY): Payer: Medicare Other | Admitting: Anesthesiology

## 2020-01-30 ENCOUNTER — Encounter (HOSPITAL_COMMUNITY)
Admission: EM | Disposition: A | Discharge status: Home Health | Payer: Self-pay | Source: Home / Self Care | Attending: Internal Medicine

## 2020-01-30 ENCOUNTER — Inpatient Hospital Stay (HOSPITAL_COMMUNITY): Payer: Medicare Other

## 2020-01-30 DIAGNOSIS — Z992 Dependence on renal dialysis: Secondary | ICD-10-CM | POA: Diagnosis not present

## 2020-01-30 DIAGNOSIS — N186 End stage renal disease: Secondary | ICD-10-CM

## 2020-01-30 DIAGNOSIS — T82898A Other specified complication of vascular prosthetic devices, implants and grafts, initial encounter: Secondary | ICD-10-CM | POA: Diagnosis not present

## 2020-01-30 HISTORY — PX: REMOVAL OF GRAFT: SHX6361

## 2020-01-30 LAB — ANCA TITERS
Atypical P-ANCA titer: <1:20 {titer}
C-ANCA: <1:20 {titer}
P-ANCA: <1:20 {titer}

## 2020-01-30 LAB — CBC
HCT: 24.5 % — ABNORMAL LOW (ref 39.0–52.0)
HCT: 23.1 % — ABNORMAL LOW (ref 39.0–52.0)
Hemoglobin: 7.3 g/dL — ABNORMAL LOW (ref 13.0–17.0)
Hemoglobin: 6.6 g/dL — CL (ref 13.0–17.0)
MCH: 28.3 pg (ref 26.0–34.0)
MCH: 27.7 pg (ref 26.0–34.0)
MCHC: 29.8 g/dL — ABNORMAL LOW (ref 30.0–36.0)
MCHC: 28.6 g/dL — ABNORMAL LOW (ref 30.0–36.0)
MCV: 95 fL (ref 80.0–100.0)
MCV: 97.1 fL (ref 80.0–100.0)
Platelets: 108 10*3/uL — ABNORMAL LOW (ref 150–400)
Platelets: 104 10*3/uL — ABNORMAL LOW (ref 150–400)
RBC: 2.58 MIL/uL — ABNORMAL LOW (ref 4.22–5.81)
RBC: 2.38 MIL/uL — ABNORMAL LOW (ref 4.22–5.81)
RDW: 17.5 % — ABNORMAL HIGH (ref 11.5–15.5)
RDW: 17.7 % — ABNORMAL HIGH (ref 11.5–15.5)
WBC: 4.3 10*3/uL (ref 4.0–10.5)
WBC: 4.7 10*3/uL (ref 4.0–10.5)
nRBC: 0 % (ref 0.0–0.2)
nRBC: 0 % (ref 0.0–0.2)

## 2020-01-30 LAB — RENAL FUNCTION PANEL
Albumin: 2.8 g/dL — ABNORMAL LOW (ref 3.5–5.0)
Anion gap: 13 (ref 5–15)
BUN: 69 mg/dL — ABNORMAL HIGH (ref 6–20)
CO2: 28 mmol/L (ref 22–32)
Calcium: 8.3 mg/dL — ABNORMAL LOW (ref 8.9–10.3)
Chloride: 96 mmol/L — ABNORMAL LOW (ref 98–111)
Creatinine, Ser: 11.32 mg/dL — ABNORMAL HIGH (ref 0.61–1.24)
GFR, Estimated: 5 mL/min — ABNORMAL LOW (ref >60)
Glucose, Bld: 81 mg/dL (ref 70–99)
Phosphorus: 6 mg/dL — ABNORMAL HIGH (ref 2.5–4.6)
Potassium: 5.7 mmol/L — ABNORMAL HIGH (ref 3.5–5.1)
Sodium: 137 mmol/L (ref 135–145)

## 2020-01-30 LAB — VON WILLEBRAND PANEL
Coagulation Factor VIII: 221 % — ABNORMAL HIGH (ref 56–140)
Ristocetin Co-factor, Plasma: 219 % — ABNORMAL HIGH (ref 50–200)
Von Willebrand Antigen, Plasma: 270 % — ABNORMAL HIGH (ref 50–200)

## 2020-01-30 LAB — BASIC METABOLIC PANEL
Anion gap: 15 (ref 5–15)
BUN: 68 mg/dL — ABNORMAL HIGH (ref 6–20)
CO2: 25 mmol/L (ref 22–32)
Calcium: 8.4 mg/dL — ABNORMAL LOW (ref 8.9–10.3)
Chloride: 97 mmol/L — ABNORMAL LOW (ref 98–111)
Creatinine, Ser: 11.55 mg/dL — ABNORMAL HIGH (ref 0.61–1.24)
GFR, Estimated: 5 mL/min — ABNORMAL LOW (ref >60)
Glucose, Bld: 99 mg/dL (ref 70–99)
Potassium: 5.1 mmol/L (ref 3.5–5.1)
Sodium: 137 mmol/L (ref 135–145)

## 2020-01-30 LAB — IRON AND TIBC
Iron: 29 ug/dL — ABNORMAL LOW (ref 45–182)
Saturation Ratios: 16 % — ABNORMAL LOW (ref 17.9–39.5)
TIBC: 181 ug/dL — ABNORMAL LOW (ref 250–450)
UIBC: 152 ug/dL

## 2020-01-30 LAB — FERRITIN: Ferritin: 434 ng/mL — ABNORMAL HIGH (ref 24–336)

## 2020-01-30 LAB — C3 COMPLEMENT: C3 Complement: 143 mg/dL (ref 82–167)

## 2020-01-30 LAB — GLUCOSE, CAPILLARY: Glucose-Capillary: 72 mg/dL (ref 70–99)

## 2020-01-30 LAB — C4 COMPLEMENT: Complement C4, Body Fluid: 32 mg/dL (ref 12–38)

## 2020-01-30 LAB — PATHOLOGIST SMEAR REVIEW

## 2020-01-30 LAB — PREPARE RBC (CROSSMATCH)

## 2020-01-30 LAB — COAG STUDIES INTERP REPORT

## 2020-01-30 SURGERY — REMOVAL, GRAFT
Anesthesia: Choice | Laterality: Left

## 2020-01-30 SURGERY — REMOVAL, GRAFT
Anesthesia: General | Site: Leg Upper | Laterality: Left

## 2020-01-30 MED ORDER — PROPOFOL 10 MG/ML IV BOLUS
INTRAVENOUS | Status: DC | PRN
  Administered 2020-01-30: 150 mg via INTRAVENOUS

## 2020-01-30 MED ORDER — HYDROMORPHONE HCL 1 MG/ML IJ SOLN
1.0000 mg | INTRAMUSCULAR | Status: DC | PRN
  Administered 2020-01-30 – 2020-02-03 (×17): 1 mg via INTRAVENOUS
  Filled 2020-01-30 (×17): qty 1

## 2020-01-30 MED ORDER — SODIUM CHLORIDE 0.9 % IV SOLN: INTRAVENOUS | Status: DC

## 2020-01-30 MED ORDER — PHENYLEPHRINE HCL (PRESSORS) 10 MG/ML IV SOLN
INTRAVENOUS | Status: DC | PRN
  Administered 2020-01-30: 160 ug via INTRAVENOUS

## 2020-01-30 MED ORDER — FENTANYL CITRATE (PF) 100 MCG/2ML IJ SOLN
INTRAMUSCULAR | Status: AC
  Administered 2020-01-30: 100 ug via INTRAVENOUS
  Filled 2020-01-30: qty 2

## 2020-01-30 MED ORDER — FENTANYL CITRATE (PF) 100 MCG/2ML IJ SOLN: 100.0000 ug | Freq: Once | INTRAMUSCULAR | Status: AC

## 2020-01-30 MED ORDER — CHLORHEXIDINE GLUCONATE 0.12 % MT SOLN
15.0000 mL | Freq: Once | OROMUCOSAL | Status: AC
  Filled 2020-01-30: qty 15

## 2020-01-30 MED ORDER — PROPOFOL 10 MG/ML IV BOLUS
INTRAVENOUS | Status: AC
  Filled 2020-01-30: qty 20

## 2020-01-30 MED ORDER — FENTANYL CITRATE (PF) 100 MCG/2ML IJ SOLN
INTRAMUSCULAR | Status: AC
  Filled 2020-01-30: qty 2

## 2020-01-30 MED ORDER — LIDOCAINE 2% (20 MG/ML) 5 ML SYRINGE
INTRAMUSCULAR | Status: DC | PRN
  Administered 2020-01-30: 100 mg via INTRAVENOUS

## 2020-01-30 MED ORDER — PHENYLEPHRINE HCL-NACL 20-0.9 MG/250ML-% IV SOLN
INTRAVENOUS | Status: DC | PRN
  Administered 2020-01-30: 60 ug/min via INTRAVENOUS

## 2020-01-30 MED ORDER — CEFAZOLIN SODIUM-DEXTROSE 2-3 GM-%(50ML) IV SOLR
INTRAVENOUS | Status: DC | PRN
  Administered 2020-01-30: 2 g via INTRAVENOUS

## 2020-01-30 MED ORDER — MELATONIN 5 MG PO TABS
10.0000 mg | ORAL_TABLET | Freq: Every day | ORAL | Status: DC
  Administered 2020-01-30 – 2020-02-20 (×22): 10 mg via ORAL
  Filled 2020-01-30 (×23): qty 2

## 2020-01-30 MED ORDER — ALBUMIN HUMAN 5 % IV SOLN: INTRAVENOUS | Status: DC | PRN

## 2020-01-30 MED ORDER — MIDAZOLAM HCL 2 MG/2ML IJ SOLN
INTRAMUSCULAR | Status: AC
  Administered 2020-01-30: 2 mg via INTRAVENOUS
  Filled 2020-01-30: qty 2

## 2020-01-30 MED ORDER — FENTANYL CITRATE (PF) 250 MCG/5ML IJ SOLN
INTRAMUSCULAR | Status: AC
  Filled 2020-01-30: qty 5

## 2020-01-30 MED ORDER — FENTANYL CITRATE (PF) 100 MCG/2ML IJ SOLN
25.0000 ug | INTRAMUSCULAR | Status: DC | PRN
  Administered 2020-01-30: 50 ug via INTRAVENOUS
  Administered 2020-01-30 (×2): 25 ug via INTRAVENOUS

## 2020-01-30 MED ORDER — ALBUMIN HUMAN 5 % IV SOLN
12.5000 g | Freq: Once | INTRAVENOUS | Status: AC
  Administered 2020-01-30: 12.5 g via INTRAVENOUS

## 2020-01-30 MED ORDER — ONDANSETRON HCL 4 MG/2ML IJ SOLN
INTRAMUSCULAR | Status: AC
  Filled 2020-01-30: qty 4

## 2020-01-30 MED ORDER — SODIUM ZIRCONIUM CYCLOSILICATE 5 G PO PACK
5.0000 g | PACK | Freq: Once | ORAL | Status: AC
  Administered 2020-01-30: 5 g via ORAL
  Filled 2020-01-30: qty 1

## 2020-01-30 MED ORDER — SODIUM CHLORIDE 0.9% IV SOLUTION: Freq: Once | INTRAVENOUS | Status: AC

## 2020-01-30 MED ORDER — MIDAZOLAM HCL 2 MG/2ML IJ SOLN: 2.0000 mg | Freq: Once | INTRAMUSCULAR | Status: AC

## 2020-01-30 MED ORDER — CHLORHEXIDINE GLUCONATE 0.12 % MT SOLN
OROMUCOSAL | Status: AC
  Administered 2020-01-30: 15 mL via OROMUCOSAL
  Filled 2020-01-30: qty 15

## 2020-01-30 MED ORDER — PHENYLEPHRINE HCL (PRESSORS) 10 MG/ML IV SOLN
INTRAVENOUS | Status: AC
  Filled 2020-01-30: qty 2

## 2020-01-30 MED ORDER — 0.9 % SODIUM CHLORIDE (POUR BTL) OPTIME
TOPICAL | Status: DC | PRN
  Administered 2020-01-30: 1000 mL

## 2020-01-30 MED ORDER — ACETAMINOPHEN 500 MG PO TABS
ORAL_TABLET | ORAL | Status: AC
  Administered 2020-01-30: 1000 mg via ORAL
  Filled 2020-01-30: qty 2

## 2020-01-30 MED ORDER — ROCURONIUM BROMIDE 10 MG/ML (PF) SYRINGE
PREFILLED_SYRINGE | INTRAVENOUS | Status: AC
  Filled 2020-01-30: qty 10

## 2020-01-30 MED ORDER — ACETAMINOPHEN 500 MG PO TABS: 1000.0000 mg | ORAL_TABLET | Freq: Once | ORAL | Status: AC

## 2020-01-30 MED ORDER — FENTANYL CITRATE (PF) 100 MCG/2ML IJ SOLN
INTRAMUSCULAR | Status: DC | PRN
  Administered 2020-01-30: 50 ug via INTRAVENOUS
  Administered 2020-01-30: 100 ug via INTRAVENOUS

## 2020-01-30 MED ORDER — EPHEDRINE SULFATE-NACL 50-0.9 MG/10ML-% IV SOSY
PREFILLED_SYRINGE | INTRAVENOUS | Status: DC | PRN
  Administered 2020-01-30: 10 mg via INTRAVENOUS
  Administered 2020-01-30: 25 mg via INTRAVENOUS
  Administered 2020-01-30: 15 mg via INTRAVENOUS
  Administered 2020-01-30: 10 mg via INTRAVENOUS

## 2020-01-30 MED ORDER — SODIUM POLYSTYRENE SULFONATE 15 GM/60ML PO SUSP: 15.0000 g | Freq: Once | ORAL | Status: DC

## 2020-01-30 MED ORDER — LIDOCAINE 2% (20 MG/ML) 5 ML SYRINGE
INTRAMUSCULAR | Status: AC
  Filled 2020-01-30: qty 5

## 2020-01-30 MED ORDER — ALBUMIN HUMAN 5 % IV SOLN
INTRAVENOUS | Status: AC
  Filled 2020-01-30: qty 250

## 2020-01-30 MED ORDER — SUCCINYLCHOLINE CHLORIDE 200 MG/10ML IV SOSY
PREFILLED_SYRINGE | INTRAVENOUS | Status: AC
  Filled 2020-01-30: qty 10

## 2020-01-30 MED ORDER — PROMETHAZINE HCL 25 MG/ML IJ SOLN
INTRAMUSCULAR | Status: AC
  Filled 2020-01-30: qty 1

## 2020-01-30 SURGICAL SUPPLY — 39 items
BNDG GAUZE ELAST 4 BULKY (GAUZE/BANDAGES/DRESSINGS) ×1 IMPLANT
CANISTER SUCT 3000ML PPV (MISCELLANEOUS) ×2 IMPLANT
CLIP VESOCCLUDE MED 24/CT (CLIP) ×2 IMPLANT
CLIP VESOCCLUDE SM WIDE 24/CT (CLIP) ×2 IMPLANT
COVER WAND RF STERILE (DRAPES) ×2 IMPLANT
DERMABOND ADVANCED (GAUZE/BANDAGES/DRESSINGS) ×1
DERMABOND ADVANCED .7 DNX12 (GAUZE/BANDAGES/DRESSINGS) ×1 IMPLANT
DRSG PAD ABDOMINAL 8X10 ST (GAUZE/BANDAGES/DRESSINGS) ×1 IMPLANT
ELECT REM PT RETURN 9FT ADLT (ELECTROSURGICAL) ×2
ELECTRODE REM PT RTRN 9FT ADLT (ELECTROSURGICAL) ×1 IMPLANT
GAUZE SPONGE 4X4 12PLY STRL LF (GAUZE/BANDAGES/DRESSINGS) ×1 IMPLANT
GLOVE BIOGEL PI IND STRL 7.5 (GLOVE) ×1 IMPLANT
GLOVE BIOGEL PI INDICATOR 7.5 (GLOVE) ×1
GLOVE SS BIOGEL STRL SZ 6.5 (GLOVE) IMPLANT
GLOVE SUPERSENSE BIOGEL SZ 6.5 (GLOVE) ×1
GLOVE SURG SS PI 7.5 STRL IVOR (GLOVE) ×2 IMPLANT
GOWN STRL REUS W/ TWL LRG LVL3 (GOWN DISPOSABLE) ×2 IMPLANT
GOWN STRL REUS W/ TWL XL LVL3 (GOWN DISPOSABLE) ×1 IMPLANT
GOWN STRL REUS W/TWL LRG LVL3 (GOWN DISPOSABLE) ×2
GOWN STRL REUS W/TWL XL LVL3 (GOWN DISPOSABLE) ×1
KIT BASIN OR (CUSTOM PROCEDURE TRAY) ×2 IMPLANT
KIT TURNOVER KIT B (KITS) ×2 IMPLANT
NS IRRIG 1000ML POUR BTL (IV SOLUTION) ×4 IMPLANT
PACK PERIPHERAL VASCULAR (CUSTOM PROCEDURE TRAY) ×2 IMPLANT
PAD ARMBOARD 7.5X6 YLW CONV (MISCELLANEOUS) ×4 IMPLANT
SPONGE LAP 18X18 RF (DISPOSABLE) ×1 IMPLANT
SUT PROLENE 5 0 C 1 24 (SUTURE) ×3 IMPLANT
SUT PROLENE 6 0 BV (SUTURE) ×2 IMPLANT
SUT SILK 2 0 SH (SUTURE) ×2 IMPLANT
SUT VIC AB 2-0 CT1 27 (SUTURE) ×2
SUT VIC AB 2-0 CT1 TAPERPNT 27 (SUTURE) ×2 IMPLANT
SUT VIC AB 3-0 SH 27 (SUTURE) ×2
SUT VIC AB 3-0 SH 27X BRD (SUTURE) ×2 IMPLANT
SUT VICRYL 4-0 PS2 18IN ABS (SUTURE) ×4 IMPLANT
TAPE CLOTH SURG 4X10 WHT LF (GAUZE/BANDAGES/DRESSINGS) ×1 IMPLANT
TOWEL GREEN STERILE (TOWEL DISPOSABLE) ×2 IMPLANT
TRAY FOLEY MTR SLVR 16FR STAT (SET/KITS/TRAYS/PACK) ×2 IMPLANT
UNDERPAD 30X36 HEAVY ABSORB (UNDERPADS AND DIAPERS) ×2 IMPLANT
WATER STERILE IRR 1000ML POUR (IV SOLUTION) ×2 IMPLANT

## 2020-01-30 NOTE — Anesthesia Postprocedure Evaluation (Signed)
Anesthesia Post Note  Patient: [name]  Procedure(s) Performed: REMOVAL OF INFECTED AV GRAFT LEFT THIGH (Left Leg Upper)     Patient location during evaluation: PACU Anesthesia Type: General Level of consciousness: sedated Pain management: pain level controlled Vital Signs Assessment: post-procedure vital signs reviewed and stable Respiratory status: spontaneous breathing and respiratory function stable Cardiovascular status: stable Postop Assessment: no apparent nausea or vomiting Anesthetic complications: no   No complications documented.  Last Vitals:  Vitals:   01/30/20 1330 01/30/20 1351  BP: 102/64 99/74  Pulse: 70 68  Resp: 10 12  Temp: 36.5 C   SpO2: 99% 98%    Last Pain:  Vitals:   01/30/20 1351  TempSrc:   PainSc: Asleep                 Anyla Israelson DANIEL

## 2020-01-30 NOTE — Anesthesia Preprocedure Evaluation (Addendum)
Anesthesia Evaluation  Patient identified by MRN, date of birth, ID band Patient awake    Reviewed: Allergy & Precautions, NPO status , Patient's Chart, lab work & pertinent test results  History of Anesthesia Complications Negative for: history of anesthetic complications  Airway Mallampati: II  TM Distance: >3 FB Neck ROM: Full    Dental  (+) Poor Dentition, Dental Advisory Given   Pulmonary neg pulmonary ROS,    Pulmonary exam normal        Cardiovascular hypertension, (-) angina+ CAD and + Peripheral Vascular Disease  Normal cardiovascular exam+ Valvular Problems/Murmurs AS  + Systolic murmurs  '21 Cath - Mid LAD lesion is 25% stenosed. 1st RPL lesion is 25% stenosed. Prox Cx to Mid Cx lesion is 25% stenosed. LV end diastolic pressure is normal. There is severe aortic valve stenosis. Mean gradient 34 mm Hg. Unable to access left femoral vein.  '21 TTE - EF 65 to 70%. Mild LVH. Grade II diastolic dysfunction (pseudonormalization). LA moderately dilated. Trivial MR. AV is functionally bicuspid. Severe AS, aortic valve mean gradient measures 37.2 mmHg. Aortic valve Vmax measures 3.88 m/s. Dimentionless index 0.25     Neuro/Psych  Headaches, Seizures -, Well Controlled,  PSYCHIATRIC DISORDERS Anxiety  Spina Bifida     GI/Hepatic Neg liver ROS, GERD  Medicated and Controlled,  Endo/Other  Hypothyroidism  Obesity   Renal/GU ESRF and DialysisRenal disease     Musculoskeletal  (+) Arthritis ,   Abdominal   Peds  Hematology  (+) anemia ,  On plavix    Anesthesia Other Findings Covid test negative   Reproductive/Obstetrics                            Anesthesia Physical  Anesthesia Plan  ASA: IV  Anesthesia Plan: General   Post-op Pain Management:    Induction: Intravenous  PONV Risk Score and Plan: 2 and Ondansetron, Midazolam and Treatment may vary due to age or medical  condition  Airway Management Planned: LMA  Additional Equipment:   Intra-op Plan:   Post-operative Plan: Extubation in OR  Informed Consent: I have reviewed the patients History and Physical, chart, labs and discussed the procedure including the risks, benefits and alternatives for the proposed anesthesia with the patient or authorized representative who has indicated his/her understanding and acceptance.     Dental advisory given  Plan Discussed with: Anesthesiologist, CRNA and Surgeon  Anesthesia Plan Comments:       Anesthesia Quick Evaluation

## 2020-01-30 NOTE — Progress Notes (Signed)
[facility] Progress Note  Subjective: 3 L off w/ HD yest. Went to OR this am for removal infect AVG L groin per VVS.  Pt not in room.   Vitals:   01/30/20 1250 01/30/20 1330 01/30/20 1351 01/30/20 1409  BP: (!) 86/41 102/64 99/74 100/64  Pulse: 68 70 68 70  Resp: '11 10 12 14  '$ Temp:  97.7 F (36.5 C) 97.7 F (36.5 C)   TempSrc:      SpO2: 95% 99% 98% 96%  Weight:        Exam: Not seen    OP HD: AF MWF   4h 45mn  350/800   118kg  2/2.25 bath  Hep 3700  R thigh TDC Sensipar 30 mg q HD Hectorol 4 mcg q HD Mircera 225 mcg IV q 2 weeks- last dose 200 mcg on 1/17, due on 1/31   Assessment/ Plan: 1.  Thigh abscess: over site of prior AVG. He has had multiple infections in this area before. Seen by VVS, going to OR this am for AVG resection/ I&D of abscess.  2. L flank sinus tract: from site of previous nephrostomy tube. On antibiotics as above.  3.  ESRD:  MWF schedule, K+ up mid 5's on arrival which is a chronic issue for this patient. He was given kayexalate and lokelma. Got HD here on 1/27. Next HD tomorrow.  4.  Hypertension/volume: BP controlled. EDw raised recently. UF as tol.  5.  Anemia: Hgb has been running low since last admission. Not due for ESA yet. Will hold heparin given recent nose bleed 6.  Metabolic bone disease: Continue sensipar, hectorol and binders. Follow phosphorus/calcium.    [name] 01/30/2020, 2:56 PM   Recent Labs  Lab 01/29/20 0625 01/30/20 0508  K 5.8* 5.7*  BUN 86* 69*  CREATININE 13.00* 11.32*  CALCIUM 8.2* 8.3*  PHOS 6.1* 6.0*  HGB 7.5* 7.3*   Inpatient medications: . calcium acetate  2,668 mg Oral TID WC  . Chlorhexidine Gluconate Cloth  6 each Topical Q0600  . cinacalcet  30 mg Oral Q M,W,F-HD  . doxercalciferol  4 mcg Intravenous Q M,W,F-HD  . escitalopram  5 mg Oral Daily  . famotidine  40 mg Oral Daily  . fentaNYL      . levothyroxine  137 mcg Oral Q0600  . melatonin  10 mg Oral QHS  . mupirocin ointment   1 application Nasal BID  . oxyCODONE-acetaminophen  1 tablet Oral Q6H   And  . oxyCODONE  5 mg Oral Q6H  . promethazine      . sodium chloride flush  3 mL Intravenous Q12H  . [START ON 01/31/2020] sodium polystyrene  15 g Oral Once per day on Sun Sat  . sodium zirconium cyclosilicate  5 g Oral Once  . traZODone  100 mg Oral QHS  . zolpidem  10 mg Oral QHS   . sodium chloride    . sodium chloride 10 mL/hr at 01/30/20 1027  . albumin human    . vancomycin Stopped (01/29/20 1332)  . vancomycin     sodium chloride, fluticasone, HYDROmorphone (DILAUDID) injection, promethazine, sodium chloride flush

## 2020-01-30 NOTE — Interval H&P Note (Signed)
History and Physical Interval Note:  01/30/2020 9:30 AM  [name]  has presented today for surgery, with the diagnosis of INFECTED THIGH GRAFT.  The various methods of treatment have been discussed with the patient and family. After consideration of risks, benefits and other options for treatment, the patient has consented to  Procedure(s): REMOVAL OF INFECTED AV GRAFT LEFT THIGH (Left) as a surgical intervention.  The patient's history has been reviewed, patient examined, no change in status, stable for surgery.  I have reviewed the patient's chart and labs.  Questions were answered to the patient's satisfaction.     Annamarie Major

## 2020-01-30 NOTE — Transfer of Care (Signed)
Immediate Anesthesia Transfer of Care Note  Patient: Zanden Burgers  Procedure(s) Performed: REMOVAL OF INFECTED AV GRAFT LEFT THIGH (Left Leg Upper)  Patient Location: PACU  Anesthesia Type:General  Level of Consciousness: awake  Airway & Oxygen Therapy: Patient Spontanous Breathing and Patient connected to face mask oxygen  Post-op Assessment: Report given to RN and Post -op Vital signs reviewed and stable  Post vital signs: Reviewed and stable  Last Vitals:  Vitals Value Taken Time  BP 100/52 01/30/20 1224  Temp 36.5 C 01/30/20 1224  Pulse 67 01/30/20 1228  Resp 10 01/30/20 1228  SpO2 93 % 01/30/20 1228  Vitals shown include unvalidated device data.  Last Pain:  Vitals:   01/30/20 1224  TempSrc:   PainSc: 10-Worst pain ever      Patients Stated Pain Goal: 0 [id number] [phone]  Complications: No complications documented.

## 2020-01-30 NOTE — Progress Notes (Addendum)
Subjective:   Patient with a moderate amount of blood and a large clot in the bed with him after removal of infected AV graft from L thigh today. Vascular called and they will come evaluate.  Otherwise complains of continued pain of R arm.   Objective:  Vital signs in last 24 hours: Vitals:   01/29/20 1242 01/29/20 1637 01/29/20 2213 01/30/20 0542  BP: 126/74 (!) 94/55  99/64  Pulse: 67 71  63  Resp:  '20 20 16  ' Temp: 98.9 F (37.2 C)  98.2 F (36.8 C) 98.5 F (36.9 C)  TempSrc: Oral  Oral Oral  SpO2:  97%  100%  Weight:        Physical Exam Constitutional: no acute distress, non-toxic appearing Head: atraumatic ENT: external ears normal, R nare with dried blood but no active bleeding, left nare with some septal thinning Eyes: EOMI Cardiovascular: regular rate and rhythm, referred bruit, unable to detect other abnormal heart sounds Pulmonary: effort normal, lungs clear to ascultation bilaterally Abdominal: flat, nontender, no rebound tenderness, bowel sounds normal Musculoskeletal: 1cm tender abscess in L thigh near location of graft, moderate amount of milky sanguinous drainage from L flank at site of sinus tract from nephrostomy tube, ecchymosis to lateral right chest and exquisite tenderness to right shoulder down to mid arm with no clear skin or soft tissue changes. The area of ecchymosis is darker and has increased in area, see picture. Also has a new area of bruise to substernal area. Skin: warm and dry. Dressing to L thigh which is with blood and serous fluid, moderately large amount of blood and clot underneath patient in bed Neurological: alert, no focal deficit Psychiatric: anxious        Assessment/Plan: [name] is a 55 y.o. male with hx of ESRD on MWF dialysis, HTN, CAD, severe aortic stenosis, spina bifida, chronic opioid use, L knee septic arthritris presenting with epistaxis, abscess near L thigh fistula, and malodorous discharge from chronic  wound due to prior L nephrostomy tube.   Principal Problem:   Abscess of left thigh Active Problems:   End stage renal disease (HCC)   PAD (peripheral artery disease) (HCC)   Nephrostomy complication (HCC)  Left Thigh Abscess Fluid around old non-functional dialysis graft of left thigh POD 0. Presented with small abscess of the left thigh which is overlying the major vasculature of his leg, previously had dialysis access there through a graft which is no longer functioning.  Ultrasound also demonstrated a deeper fluid collection surrounding the graft.  History of left thigh wound which was infected in October 2021, had a graft in a location removed.  Cultures grew corynebacterium stratum, treated with 4 weeks of vancomycin and ceftazidime. Surgery by vascular on 1/28. - Vascular surgery consultation, appreciate assistance.      -will reassess for continued bleeding - Vancomycin per pharmacy, day 3 - Dilaudid 1 mg IV, increase to q3, for breakthrough pain  Left flank sinus tract  Several month history of drainage from previous left nephrostomy tube placed.  He had a CT renal study in October 2021 that did show a sinus tract leading to the skin, however no evidence of abscess.    Repeat CT scan this admission shows the same with no fluid collection. - urology consulted, appreciate recommendations     -No need for urgent surgery, ultimate treatment will be a nephrectomy which patient does not wish to have.  Follow-up with his urologist, Dr. Tresa Moore, outpatient     -  Dressing changes daily as needed     -Wound care consult - Vancomycin as above - wound culture pending  ESRD on MWF dialysis Impaired dialysis access Hyperkalemia Patient is well-known to the vascular surgery department and has exhausted most of his access sites. Had previous access to upper extremities and in the left lower extremity, now has a right femoral tunneled dialysis catheter. Has had adequate flow recently, at recent  admission required Cathflo treatments.  -dialysis per nephro -Continue home PhosLo -Daily BMP -Nephro diet -Avoid nephrotoxins -Kayexalate/Lokelma as needed.  Brought his home Kayexalate which he prefers due to taste.  Epistaxis - resolved Spontaneous Ecchymosis R shoulder pain Ecchymosis on bilateral chest without known trauma, R-sided ecchymosis has been increasing. Pain of R arm more so than chest, though both areas are tender. Platelets were okay at 109,000. Checking labs for bleeding disorder. He is noted to be on full strength ASA and Plavix at home, and uremia from ESRD also likely contributing. Also gets heparin at dialysis, but low concern for HIT given normal PT, INT, and PTT. Uremia also elevated with his ESRD, so could be an element of platelet dysfunction. Also checking labs to rule out vasculitis. Smear showed normocytic anemia.  Mild elevations in ESR and CRP is nonspecific. Negative HepC, C4, C3. Von willebrand panel elevated which is not consistent with vWF, could be due to rise as acute phase reactants. Of his medications, SSRI can increase antiplatelet effect.  -f/u ANCA, mpo -f/u CT R shoulder -daily CBC  Chronic pain syndrome -continue home oxycodone 89m q6hr prn  Non-obstructive CAD -holding Plavix and ASA due to acute epistaxis  Diet:  renal IVF:  none VTE:  SCDs Prior to Admission Living Arrangement:  home Anticipated Discharge Location:  home Barriers to Discharge:  Surgery complication of dialysis graft Dispo: Anticipated discharge in approximately 2-4 day(s).   CAndrew Au MD 01/30/2020, 6:49 AM Pager: [phone] 5pm on weekdays and 1pm on weekends: On Call pager [phone]

## 2020-01-30 NOTE — Plan of Care (Signed)
  Problem: Education: Goal: Knowledge of General Education information will improve Description: Including pain rating scale, medication(s)/side effects and non-pharmacologic comfort measures 01/30/2020 0043 by Berta Minor, RN Outcome: Progressing 01/30/2020 0017 by Berta Minor, RN Outcome: Progressing   Problem: Health Behavior/Discharge Planning: Goal: Ability to manage health-related needs will improve 01/30/2020 0043 by Berta Minor, RN Outcome: Progressing 01/30/2020 0017 by Berta Minor, RN Outcome: Progressing   Problem: Clinical Measurements: Goal: Ability to maintain clinical measurements within normal limits will improve 01/30/2020 0043 by Berta Minor, RN Outcome: Progressing 01/30/2020 0017 by Berta Minor, RN Outcome: Progressing Goal: Will remain free from infection 01/30/2020 0043 by Berta Minor, RN Outcome: Progressing 01/30/2020 0017 by Berta Minor, RN Outcome: Progressing Goal: Diagnostic test results will improve 01/30/2020 0043 by Berta Minor, RN Outcome: Progressing 01/30/2020 0017 by Berta Minor, RN Outcome: Progressing Goal: Respiratory complications will improve 01/30/2020 0043 by Berta Minor, RN Outcome: Progressing 01/30/2020 0017 by Berta Minor, RN Outcome: Progressing Goal: Cardiovascular complication will be avoided 01/30/2020 0043 by Berta Minor, RN Outcome: Progressing 01/30/2020 0017 by Berta Minor, RN Outcome: Progressing   Problem: Activity: Goal: Risk for activity intolerance will decrease 01/30/2020 0043 by Berta Minor, RN Outcome: Progressing 01/30/2020 0017 by Berta Minor, RN Outcome: Progressing   Problem: Nutrition: Goal: Adequate nutrition will be maintained 01/30/2020 0043 by Berta Minor, RN Outcome: Progressing 01/30/2020 0017 by Berta Minor, RN Outcome: Progressing   Problem:  Coping: Goal: Level of anxiety will decrease 01/30/2020 0043 by Berta Minor, RN Outcome: Progressing 01/30/2020 0017 by Berta Minor, RN Outcome: Progressing   Problem: Elimination: Goal: Will not experience complications related to bowel motility 01/30/2020 0043 by Berta Minor, RN Outcome: Progressing 01/30/2020 0017 by Berta Minor, RN Outcome: Progressing Goal: Will not experience complications related to urinary retention 01/30/2020 0043 by Berta Minor, RN Outcome: Progressing 01/30/2020 0017 by Berta Minor, RN Outcome: Progressing   Problem: Pain Managment: Goal: General experience of comfort will improve 01/30/2020 0043 by Berta Minor, RN Outcome: Progressing 01/30/2020 0017 by Berta Minor, RN Outcome: Progressing   Problem: Safety: Goal: Ability to remain free from injury will improve 01/30/2020 0043 by Berta Minor, RN Outcome: Progressing 01/30/2020 0017 by Berta Minor, RN Outcome: Progressing   Problem: Skin Integrity: Goal: Risk for impaired skin integrity will decrease 01/30/2020 0043 by Berta Minor, RN Outcome: Progressing 01/30/2020 0017 by Berta Minor, RN Outcome: Progressing

## 2020-01-30 NOTE — Plan of Care (Signed)

## 2020-01-30 NOTE — Anesthesia Procedure Notes (Signed)
Procedure Name: LMA Insertion Date/Time: 01/30/2020 10:34 AM Performed by: Hoy Morn, CRNA Pre-anesthesia Checklist: Patient identified, Emergency Drugs available, Suction available, Patient being monitored and Timeout performed Patient Re-evaluated:Patient Re-evaluated prior to induction Oxygen Delivery Method: Circle system utilized Preoxygenation: Pre-oxygenation with 100% oxygen Induction Type: IV induction Ventilation: Mask ventilation without difficulty LMA: LMA inserted LMA Size: 4.0 Number of attempts: 1 Tube secured with: Tape Dental Injury: Teeth and Oropharynx as per pre-operative assessment

## 2020-01-31 ENCOUNTER — Encounter (HOSPITAL_COMMUNITY): Payer: Self-pay | Admitting: Surgery

## 2020-01-31 LAB — RENAL FUNCTION PANEL
Albumin: 3.3 g/dL — ABNORMAL LOW (ref 3.5–5.0)
Anion gap: 16 — ABNORMAL HIGH (ref 5–15)
BUN: 75 mg/dL — ABNORMAL HIGH (ref 6–20)
CO2: 25 mmol/L (ref 22–32)
Calcium: 8.4 mg/dL — ABNORMAL LOW (ref 8.9–10.3)
Chloride: 96 mmol/L — ABNORMAL LOW (ref 98–111)
Creatinine, Ser: 12.36 mg/dL — ABNORMAL HIGH (ref 0.61–1.24)
GFR, Estimated: 4 mL/min — ABNORMAL LOW (ref >60)
Glucose, Bld: 78 mg/dL (ref 70–99)
Phosphorus: 6.5 mg/dL — ABNORMAL HIGH (ref 2.5–4.6)
Potassium: 5.3 mmol/L — ABNORMAL HIGH (ref 3.5–5.1)
Sodium: 137 mmol/L (ref 135–145)

## 2020-01-31 LAB — CBC WITH DIFFERENTIAL/PLATELET
Abs Immature Granulocytes: 0.01 10*3/uL (ref 0.00–0.07)
Basophils Absolute: 0 10*3/uL (ref 0.0–0.1)
Basophils Relative: 0 %
Eosinophils Absolute: 0.3 10*3/uL (ref 0.0–0.5)
Eosinophils Relative: 5 %
HCT: 26.4 % — ABNORMAL LOW (ref 39.0–52.0)
Hemoglobin: 8.1 g/dL — ABNORMAL LOW (ref 13.0–17.0)
Immature Granulocytes: 0 %
Lymphocytes Relative: 14 %
Lymphs Abs: 0.7 10*3/uL (ref 0.7–4.0)
MCH: 28.7 pg (ref 26.0–34.0)
MCHC: 30.7 g/dL (ref 30.0–36.0)
MCV: 93.6 fL (ref 80.0–100.0)
Monocytes Absolute: 0.6 10*3/uL (ref 0.1–1.0)
Monocytes Relative: 11 %
Neutro Abs: 3.5 10*3/uL (ref 1.7–7.7)
Neutrophils Relative %: 70 %
Platelets: 115 10*3/uL — ABNORMAL LOW (ref 150–400)
RBC: 2.82 MIL/uL — ABNORMAL LOW (ref 4.22–5.81)
RDW: 18.2 % — ABNORMAL HIGH (ref 11.5–15.5)
WBC: 5 10*3/uL (ref 4.0–10.5)
nRBC: 0 % (ref 0.0–0.2)

## 2020-01-31 LAB — AEROBIC CULTURE W GRAM STAIN (SUPERFICIAL SPECIMEN)

## 2020-01-31 LAB — MPO/PR-3 (ANCA) ANTIBODIES
ANCA Proteinase 3: <3.5 U/mL (ref 0.0–3.5)
Myeloperoxidase Abs: <9 U/mL (ref 0.0–9.0)

## 2020-01-31 LAB — GLUCOSE, CAPILLARY: Glucose-Capillary: 85 mg/dL (ref 70–99)

## 2020-01-31 LAB — VITAMIN D 25 HYDROXY (VIT D DEFICIENCY, FRACTURES): Vit D, 25-Hydroxy: 18.93 ng/mL — ABNORMAL LOW (ref 30–100)

## 2020-01-31 MED ORDER — ALTEPLASE 2 MG IJ SOLR
INTRAMUSCULAR | Status: AC
  Administered 2020-01-31: 5.8 mg
  Filled 2020-01-31: qty 6

## 2020-01-31 MED ORDER — HEPARIN SODIUM (PORCINE) 1000 UNIT/ML DIALYSIS: 1500.0000 [IU] | INTRAMUSCULAR | Status: DC | PRN

## 2020-01-31 MED ORDER — SODIUM POLYSTYRENE SULFONATE PO POWD
15.0000 g | ORAL | Status: DC
  Administered 2020-02-01: 15 g via ORAL
  Filled 2020-01-31 (×4): qty 15

## 2020-01-31 MED ORDER — ALTEPLASE 2 MG IJ SOLR
INTRAMUSCULAR | Status: AC
  Filled 2020-01-31: qty 4

## 2020-01-31 MED ORDER — HEPARIN SODIUM (PORCINE) 1000 UNIT/ML IJ SOLN
INTRAMUSCULAR | Status: AC
  Filled 2020-01-31: qty 4

## 2020-01-31 MED ORDER — ALTEPLASE 2 MG IJ SOLR: 2.0000 mg | INTRAMUSCULAR | Status: AC

## 2020-01-31 NOTE — Plan of Care (Signed)

## 2020-01-31 NOTE — Progress Notes (Addendum)
Subjective:   2 grafts removed from thigh yesterday by vascular surgery. Dressing changed yesterday and this morning. Reports some oozing overnight but not a significant amount.  This morning, patient complains of continued pain to the right shoulder and increased pain on the left thigh after dressing changes and surgery yesterday.  Breathing well.   Objective:  Vital signs in last 24 hours: Vitals:   01/30/20 1950 01/30/20 2030 01/30/20 2308 01/31/20 0549  BP: (!) 112/59 (!) 101/55 (!) 105/49 (!) 90/44  Pulse: 71 68 69 65  Resp: '15 16 15 19  ' Temp: 98.6 F (37 C) 98.5 F (36.9 C) 98.3 F (36.8 C) 98 F (36.7 C)  TempSrc: Oral Other (Comment) Oral   SpO2: 100% 99% 98% 100%  Weight:        Physical Exam Constitutional: no acute distress, non-toxic appearing Head: atraumatic ENT: external ears normal Eyes: EOMI Cardiovascular: regular rate and rhythm, referred bruit, unable to detect other abnormal heart sounds Pulmonary: effort normal, lungs clear to ascultation bilaterally Abdominal: flat, nontender, no rebound tenderness, bowel sounds normal Musculoskeletal: Dressing over left thigh at location of infected graft removal, no blood at this time after dressing was recently changed, left flank site of sinus tract at prior nephrostomy tube with dressing in place, ecchymosis to lateral right chest which looks unchanged from prior day, smaller areas of ecchymosis to substernal and left chest as well pictured below, tenderness to right deltoid and bicep area Skin: warm and dry Neurological: alert, no focal deficit Psychiatric: anxious       Assessment/Plan: [name] is a 55 y.o. male with hx of ESRD on MWF dialysis, HTN, CAD, severe aortic stenosis, spina bifida, chronic opioid use, L knee septic arthritris presenting with epistaxis, abscess near L thigh fistula, and malodorous discharge from chronic wound due to prior L nephrostomy tube.  Now status post removal of  infected graft.  Principal Problem:   Abscess of left thigh Active Problems:   End stage renal disease (HCC)   PAD (peripheral artery disease) (HCC)   Nephrostomy complication (HCC)  Left Thigh Abscess Fluid around old non-functional dialysis graft of left thigh POD 1. Presented with small abscess of the left thigh as well as deeper fluid collection around nonfunctioning graft for dialysis.  This was removed by vascular surgery on 1/28, cultures are pending.  Has had other infected grafts before including 1 in the left thigh, cultures from that grew corynebacterium stratum, treated with 4 weeks of vancomycin and ceftazidime.  - Vascular surgery consultation, appreciate assistance.  - Vancomycin per pharmacy, day 4 - Dilaudid 1 mg IV q3, for breakthrough pain - f/u wound culture  Left flank sinus tract  Several month history of drainage from previous left nephrostomy tube placed.  He had a CT renal study in October 2021 that did show a sinus tract leading to the skin, however no evidence of abscess.    Repeat CT scan this admission shows the same with no fluid collection. - urology consulted, appreciate recommendations     -No need for urgent surgery, ultimate treatment will be a nephrectomy which patient does not wish to have.  Follow-up with his urologist, Dr. Tresa Moore, outpatient     -Dressing changes daily as needed     -Wound care consult - Vancomycin as above - wound culture with abundant Gram variable rods and rare G positive cocci, further results pending  Spontaneous Ecchymosis Epistaxis - resolved Ecchymosis on bilateral chest without known trauma, R-sided ecchymosis  has been increasing. Pain of R arm more so than chest, though both areas are tender. Platelets were okay at 109,000. Checking labs for bleeding disorder. He is noted to be on full strength ASA and Plavix at home, and uremia from ESRD also likely contributing. Also gets heparin at dialysis, but low concern for HIT given  normal PT, INR, and PTT. Uremia also elevated with his ESRD, so could be an element of platelet dysfunction. Workup for bleeding dyscrasia largely unremarkable at this time. Smear showed normocytic anemia.  Mild elevations in ESR and CRP which is nonspecific. Negative HepC, C4, C3. Von willebrand panel elevated which is not consistent with vWF, could be due to rise as acute phase reactants.  -f/u ANCA, mpo -f/u PTH -daily CBC  R shoulder pain Vitamin D deficiency Possibly related to the painful ecchymosis on his right chest, though he is complaining more of pain to the right arm.  On exam he is very tender but there is no skin or soft tissue abnormality except for old graft. CT of right shoulder with joint effusion which could be hemarthrosis, similar effusion seen on 10/05/2019. lucent/lytic lesions in the right humeral head potentially presenting brown tumors, and white deposition, or subperiosteal bone absorption.  Could be secondary to renal osteodystrophy.  Also mild subscapularis atrophy, fluid in subacromial subdeltoid bursa which indicate rotator cuff tear, and chronic deformity of scapula likely reflecting old healed fracture. -start vitamin D supplementation -f/u PTH -pain control as above  ESRD on MWF dialysis Impaired dialysis access Hyperkalemia Patient is well-known to the vascular surgery department and has exhausted most of his access sites. Had previous access to upper extremities and in the left lower extremity, now has a right femoral tunneled dialysis catheter. Has had adequate flow recently, at recent admission required Cathflo treatments.  -dialysis per nephro -Continue home PhosLo -Daily BMP -Nephro diet -Avoid nephrotoxins -Kayexalate/Lokelma as needed.  Brought his home Kayexalate which he prefers due to taste.  Chronic pain syndrome -continue home oxycodone 68m q6hr prn -pain control for acute problem as above  Non-obstructive CAD -holding Plavix and ASA at  this time for acute bleeding  Diet:  renal IVF:  none VTE:  SCDs Prior to Admission Living Arrangement:  home Anticipated Discharge Location:  home Barriers to Discharge:  Surgery complication of dialysis graft Dispo: Anticipated discharge in approximately 1-3 day(s).   CAndrew Au MD 01/31/2020, 6:24 AM Pager: [phone] 5pm on weekdays and 1pm on weekends: On Call pager [phone]

## 2020-01-31 NOTE — Discharge Summary (Addendum)
Name: [name] MRN: [id number] DOB: 16-Apr-1965 55 y.o. PCP: [name], MD  Date of Admission: 01/27/2020  3:35 PM Date of Discharge:  02/18/2020 Attending Physician: Velna Ochs, MD   Discharge Diagnosis: 1. Abscess of left thigh 2/2 infected nonfunctional AVG 2. ESRD with end-stage vascular access 3. Right shoulder hemarthrosis 2/2 gout 4. Acute on chronic pain 5. L flank sinus tract from prior nephrostomy site 6. Epistaxis 7. Spontaneous ecchymosis  Discharge Medications: Allergies as of 02/18/2020      Reactions   Hydrocodone Other (See Comments)   Caused involuntary movement and twitching. CANNOT TAKE DUE TO MUSCLE SPASMS AND MUSCLE TREMORS   Furadantin [nitrofurantoin] Other (See Comments)   UNSPECIFIED REACTION    Mandelamine [methenamine] Other (See Comments)   UNSPECIFIED REACTION    Noroxin [norfloxacin] Other (See Comments)   UNSPECIFIED REACTION    Colace [docusate] Diarrhea, Nausea And Vomiting   Elemental Sulfur Cough   Childhood reaction - pt could not confirm that it was a cough   Sulfa Antibiotics Other (See Comments), Cough   Childhood reaction - pt could not confirm that it was a cough      Medication List    STOP taking these medications   aspirin 325 MG tablet Replaced by: aspirin 81 MG EC tablet   fentaNYL 75 MCG/HR Commonly known as: DURAGESIC Replaced by: fentaNYL 100 MCG/HR   furosemide 80 MG tablet Commonly known as: LASIX   oxyCODONE-acetaminophen 10-325 MG tablet Commonly known as: PERCOCET Replaced by: oxyCODONE-acetaminophen 5-325 MG tablet     TAKE these medications   acetaminophen 500 MG tablet Commonly known as: TYLENOL Take 1,000 mg by mouth every 6 (six) hours as needed for mild pain or headache.   aspirin 81 MG EC tablet Take 1 tablet (81 mg total) by mouth daily. Swallow whole. Replaces: aspirin 325 MG tablet   calcium acetate 667 MG capsule Commonly known as: PHOSLO Take 3-4 capsules (2,001-2,668  mg total) by mouth See admin instructions. Take 2708 mg by mouth 3 times daily with meals, take 2001 mg by mouth with snacks What changed:   how much to take  when to take this  additional instructions   calcium acetate 667 MG capsule Commonly known as: PHOSLO Take 4 capsules (2,668 mg total) by mouth 3 (three) times daily with meals. What changed: You were already taking a medication with the same name, and this prescription was added. Make sure you understand how and when to take each.   cinacalcet 30 MG tablet Commonly known as: SENSIPAR Take 1 tablet (30 mg total) by mouth every Monday, Wednesday, and Friday with hemodialysis.   clopidogrel 75 MG tablet Commonly known as: PLAVIX Take 75 mg by mouth daily.   diclofenac Sodium 1 % Gel Commonly known as: VOLTAREN Apply 2 g topically daily as needed (For leg pain).   doxercalciferol 4 MCG/2ML injection Commonly known as: HECTOROL Inject 2 mLs (4 mcg total) into the vein every Monday, Wednesday, and Friday with hemodialysis.   escitalopram 5 MG tablet Commonly known as: LEXAPRO Take 5 mg by mouth daily.   famotidine 40 MG tablet Commonly known as: PEPCID Take 40 mg by mouth daily.   fentaNYL 100 MCG/HR Commonly known as: Omro 1 patch onto the skin every 3 (three) days. Replaces: fentaNYL 75 MCG/HR   fluticasone 50 MCG/ACT nasal spray Commonly known as: FLONASE Place 2 sprays into both nostrils as needed for allergies or rhinitis (congestion). What changed: when to take this  levothyroxine 137 MCG tablet Commonly known as: SYNTHROID Take 137 mcg by mouth daily before breakfast.   lidocaine 5 % Commonly known as: LIDODERM Place 2 patches onto the skin daily.   LORazepam 1 MG tablet Commonly known as: ATIVAN Take 1 mg by mouth daily.   Melatonin 10 MG Tabs Take 10 mg by mouth at bedtime.   Nepro Liqd Take 237 mLs by mouth daily. Mixed Berry   Oxycodone HCl 10 MG Tabs Take 1 tablet (10 mg  total) by mouth every 6 (six) hours.   oxyCODONE-acetaminophen 5-325 MG tablet Commonly known as: PERCOCET/ROXICET Take 1 tablet by mouth every 6 (six) hours. Replaces: oxyCODONE-acetaminophen 10-325 MG tablet   polyethylene glycol 17 g packet Commonly known as: MIRALAX / GLYCOLAX Take 17 g by mouth daily.   promethazine 25 MG tablet Commonly known as: PHENERGAN Take 1 tablet (25 mg total) by mouth 2 (two) times daily as needed for nausea or vomiting.   rosuvastatin 10 MG tablet Commonly known as: CRESTOR Take 1 tablet (10 mg total) by mouth daily.   senna 8.6 MG Tabs tablet Commonly known as: SENOKOT Take 2 tablets (17.2 mg total) by mouth daily as needed for mild constipation.   sodium polystyrene powder Commonly known as: KAYEXALATE Take 15 g by mouth See admin instructions. Take 15 g (mixed in water) by mouth on Saturdays and 'Sundays   traZODone 100 MG tablet Commonly known as: DESYREL Take 1 tablet (100 mg total) by mouth at bedtime.   Vitamin D3 25 MCG tablet Commonly known as: Vitamin D Take 1 tablet (1,000 Units total) by mouth daily.   zolpidem 10 MG tablet Commonly known as: AMBIEN Take 1 tablet (10 mg total) by mouth at bedtime.            Durable Medical Equipment  (From admission, onward)         Start     Ordered   02/18/20 1309  For home use only DME Negative pressure wound device  Once       Question Answer Comment  Frequency of dressing change 3 times per week   Length of need 3 Months   Dressing type Foam   Amount of suction 100 mm/Hg   Pressure application Continuous pressure   Supplies 10 canisters and 15 dressings per month for duration of therapy      02' /16/22 1314   02/09/20 0546  For home use only DME Other see comment  Once       Comments: Reacher  Question:  Length of Need  Answer:  Lifetime   02/09/20 0545           Discharge Care Instructions  (From admission, onward)         Start     Ordered   02/18/20 0000   Discharge wound care:       Comments: Change wound vac dressing   02/18/20 1506   02/18/20 0000  Change dressing (specify)       Comments: Dressing change: Change wound VAC 3 times a week   02/18/20 1506          Disposition and follow-up:   [name] was discharged from [facility] in Stable condition.  At the hospital follow up visit please address:  1.  Follow up: . Epistaxis and spontaneous ecchymosis-On ASA 325 and Plavix 75 chronically to maintain his tenuous dialysis access, had nonlife-threatening bleed during this admission requiring multiple blood transfusions and 1 iron  transfusion. Discharged on ASA 81 mg and Plavix 75 mg with guidance from vascular surgery . Abscess and infected AVG of left thigh-S/p wound VAC placement on 2/14. Ensure he is getting wound VAC changes 3x/week and wound is healing appropriately . ESRD w/ end-stage vascular access-Right ffem HD cath exchanged on 2/4 by vascular with improvement in flow rate. Continue outpatient HD Monday Wednesday Friday . 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 Tresa Moore . Acute on chronic pain--Discharged home on an increased dose of home oxy and fentanyl (OxyIR 10 mg, percocet 5-325 mg and fentanyl 100 mcg patch).  Please reassess pain and make changes at your discretion.  2.  Labs / imaging needed at time of follow-up: RFP, CBC  3.  Pending labs/ test needing follow-up: None  Follow-up Appointments: PCP  Hospital Course by problem list:  Left thigh abscess 2/2 nonfunctional AVG Presented with small abscessofthe left thigh which started 1 day prior. Ultrasound also demonstrated a deeper hypoechoic area around a nonfunctioning graft for dialysis. Vascular surgery removed 2 old catheters on 1/28 from the left thigh.  Culture did not grow any organism.  He was treated with a 5-day course of vancomycin.  Wet-to-dry dressing was done to  hospitalization until wound VAC placement on 2/14.  He was discharged home on 2/16 to continue wound care at home. He will need wound VAC change 3X/week and will need to follow-up with vascular in 2 to 3 weeks.  ESRD on MWF w/ End Stage Vacular Access Patient had problems with HD access during the early days of his hospitalization after his Plavix was held for his I&D. Patient had manipulation of R femoral tunnel catheter on 2/1. He continued to have low flow rate so catheter was exchanged on 2/4. After catheter exchanged, patient had great flow rate and no other problems with HD access. He will continue outpatient HD. Per nephrology, patient has no more access sites after multiple exchanges after more than 10 years on dialysis.   Right shoulder hemarthrosis 2/2 gout Patient was found to have hemarthrosis on CT on 1/28. Pain was managed with opiates and ortho was consulted for possible arthrocentesis. They recommended against arthrocentesis sue to the high risk of septic arthritis and recommended treating for gout. Patient has tendency to bleed into his gout so we treated empirically with 14 days of prednisone 40 mg.   Acute on Chronic pain Patient with chronic pain on Percocet 10-325 mg every 6 hr as needed for pain at home. His pain was managed with IV dilaudid, Oxy 10 mg, percocet 5-325 mg, fentanyl patch (75 mg >> 100 mcg) during hospitalization. He was discharged home on increased dose of home oxy and fentanyl.    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 States that it did close in October for approximately 1 month, but it started draining again.  The volume increased and became more milky prior to this admission.  CT scan in October did not show any drainable fluid collection, but did demonstrate a sinus tract to the skin. Urology was consulted during this admission  recommended repeat scan, which again showed a sinus tract but no fluid collection. There was no need for urgent treatment, ultimate management would be a nephrectomy which patient is reluctant to consider.  Plan to follow-up with Dr. Tresa Moore in the outpatient.  Spontaneous ecchymosis Epistaxis Nonlife-threatening  bleeding during this admission, likely exacerbated by his home full dose aspirin, Plavix 75 mg, and uremia.  Nosebleed was controlled with TXA packing the patient was noted to frequently blow his nose.  This resolved by day 1 of admission.  Concern for other bleeding dyscrasia, but work-up largely negative.  Initially concern for HIT given heparin for dialysis, but platelets were 109,000. Smear showed normocytic anemia.  Mild elevations in ESR and CRP which is nonspecific. Negative HepC, C4, C3. Von willebrand panel elevated which is not consistent with vWF, could be due to rise as acute phase reactants.  ANCA panel was negative. He has very tenuous dialysis access so requires antiplatelet agents.  Vascular recommended continuing him on ASA 81 mg and Plavix 75 mg.  He was discharged on both ASA and Plavix.  Subjective on day of discharge: Patient was assessed during dialysis session. He continues to endorse pain but this has been stable. He was thankful the team agreed to wean him off the IV Dilaudid slowly. He later endorsed some chest pain but EKG was unremarkable.  Discharge Exam:   BP (!) 100/42 (BP Location: Right Wrist)   Pulse 61   Temp 98.6 F (37 C)   Resp 15   Ht '5\' 11"'  (1.803 m)   Wt 119 kg   SpO2 100%   BMI 36.59 kg/m    General: Well-appearing man laying in HD bed. No acute distress. CV: Regular rate. IV/VI systolic murmur. Trace LE edema.  Pulmonary: Lungs CTAB. Normal effort. Extremities: Wound VAC in place with no drainage. Skin: Warm and dry. Neuro: Alert and oriented. No focal deficits.  Pertinent Labs, Studies, and Procedures:  DG Chest 2 View  Result Date:  01/27/2020 CLINICAL DATA:  55 year old male with chest pain. EXAM: CHEST - 2 VIEW COMPARISON:  Chest radiograph dated 12/09/2019. FINDINGS: Mild cardiomegaly and mild vascular congestion. No focal consolidation, pleural effusion or pneumothorax. No acute osseous pathology. Partially visualized abdominal vascular catheter on the lateral view. IMPRESSION: Mild cardiomegaly and mild vascular congestion. Electronically Signed   By: Anner Crete M.D.   On: 01/27/2020 16:31   [place] LT LOWER EXTREM LTD SOFT TISSUE NON VASCULAR  Result Date: 01/28/2020 CLINICAL DATA:  Left thigh abscess. EXAM: ULTRASOUND LEFT LOWER EXTREMITY LIMITED TECHNIQUE: Ultrasound examination of the lower extremity soft tissues was performed in the area of clinical concern. COMPARISON:  Left knee series 10/05/2019. FINDINGS: A 1.0 x 0.6 x 0.9 cm complex hypoechoic area noted in the left upper thigh in the area of clinical concern. This could represent a small hematoma or abscess. Also noted is a 4.1 x 2.0 x 2.3 cm complex hypoechoic area noted around the left lower extremity graft. This could represent a process such as abscess, hematoma, or pseudoaneurysm. Left lower extremity graft does not appear patent. No flow noted in the above described complex hypoechoic areas. Soft tissue edema most likely present. IMPRESSION: 1. A 1.0 x 0.6 x 0.9 cm complex hypoechoic area noted in the left upper thigh in the area of clinical concern. This could represent a small hematoma or abscess. 2. Also noted is a 4.1 x 2.0 x 2.3 cm complex hypoechoic area noted around the left lower extremity graft. This could represent a process such as abscess, hematoma, or thrombosed pseudoaneurysm. The left lower extremity graft does not appear to be patent. Electronically Signed   By: Marcello Moores  Register   On: 01/28/2020 08:41   CT RENAL STONE STUDY  Result Date: 01/28/2020 CLINICAL DATA:  Draining nephrostomy site, evaluate for abscess. Left groin fistula. EXAM: CT ABDOMEN  AND PELVIS WITHOUT CONTRAST TECHNIQUE: Multidetector CT imaging of the abdomen and pelvis was performed following the standard protocol without IV contrast. COMPARISON:  10/06/2019. FINDINGS: Lower chest: Mild scarring in the lingula. Atherosclerotic calcification of the aortic valve and coronary arteries. Pulmonic trunk is enlarged. Heart is at the upper limits of normal in size to mildly enlarged. No pericardial or pleural effusion. Hepatobiliary: Liver is unremarkable. Stones are seen in the gallbladder. No biliary ductal dilatation. Pancreas: Negative. Spleen: Negative. Adrenals/Urinary Tract: Adrenal glands are unremarkable. Kidneys are atrophic. Stones are seen in the kidneys bilaterally. There is left perirenal soft tissue thickening which is contiguous with a tract that traverses the intercostal space between the left eleventh and twelfth posterior ribs, extending to the skin surface. No definite organized fluid collection. Appearance is similar to 10/06/2019. Ureters are decompressed. Bladder is very low in volume. Stomach/Bowel: Stomach and majority of the small bowel are unremarkable. 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 Fair amount of stool is seen in the colon. Vascular/Lymphatic: Atherosclerotic calcification of the aorta. Postoperative changes arteriovenous graft in the left groin with a peripherally calcified saccular pseudoaneurysm measuring 2.1 cm. A soft tissue tract is seen to the skin surface (3/99), similar. A stent is seen in left common iliac vein. Central venous catheter is seen from a right femoral approach, terminating in the IVC. Abdominal retroperitoneal lymph nodes measure up to 11 mm in the left periaortic station (3/52), as before. Reproductive: Prostate is visualized. Other: No free fluid. Mesenteries and peritoneum are otherwise unremarkable. Musculoskeletal: Old inferior pubic rami fractures. Renal osteodystrophy.  IMPRESSION: 1. Left perirenal soft tissue thickening with a tract that extends to the skin surface of the left flank, unchanged from 10/06/2019. No definite organized fluid collection. 2. Left groin arteriovenous dialysis graft with a soft tissue tract extending to the skin surface, similar to the prior exam. No organized fluid collection. Associated saccular pseudoaneurysm. 3. Cholelithiasis. 4. Bilateral renal stones. 5. 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 6. Stable borderline enlarged abdominal retroperitoneal lymph nodes. 7. Aortic atherosclerosis (ICD10-I70.0). Coronary artery calcification. 8. Enlarged pulmonic trunk, indicative pulmonary arterial hypertension. Electronically Signed   By: Lorin Picket M.D.   On: 01/28/2020 12:10   CT SHOULDER RIGHT WO CONTRAST  Result Date: 01/30/2020 CLINICAL DATA:  Right shoulder pain for 3 days. EXAM: CT OF THE UPPER RIGHT EXTREMITY WITHOUT CONTRAST TECHNIQUE: Multidetector CT imaging of the upper right extremity was performed according to the standard protocol. COMPARISON:  Multiple exams, including shoulder radiographs from 10/11/2019 FINDINGS: Bones/Joint/Cartilage Rugger [place] spine appearance favoring hyperparathyroidism which may be secondary to renal osteodystrophy. Lucent/lytic lesions in the right humeral head potentially representing brown tumors, amyloid deposition, or subperiosteal bone resorption. Gout arthropathy can also have a similar appearance Degenerative subcortical cystic lesions in the upper glenoid. Mild humeral head spurring. Chronic deformity of the right scapula likely from an old healed fracture. Generalized mild bony sclerosis. Hyperdense right joint effusion, hemarthrosis is a distinct possibility. This was present but to a lesser degree in both shoulders on 10/05/2019. Ligaments Suboptimally assessed by CT. Muscles and Tendons Mild subscapularis atrophy. Probable fluid in  the subacromial subdeltoid bursa which may indicate rotator cuff tear. Soft tissues Bilateral common carotid atherosclerotic calcification. Calcifications in the left subclavian artery possibly from chronic thrombus. Atherosclerotic aortic arch. IMPRESSION: 1. Hyperdense right joint effusion,  hemarthrosis is a distinct possibility. However, similar hyperdense joint effusions are present bilaterally on prior chest CT of 10/05/2019 hence this may be a chronic condition. 2. Rugger [place] spine appearance favoring hyperparathyroidism which may be secondary to renal osteodystrophy. Lucent/lytic lesions in the right humeral head potentially representing brown tumors, amyloid deposition, or subperiosteal bone resorption. Gout arthropathy can also have a similar appearance. 3. Mild subscapularis atrophy. 4. Probable fluid in the subacromial subdeltoid bursa which may indicate rotator cuff tear. 5. Bilateral common carotid atherosclerotic calcification. Calcifications in the left subclavian artery possibly from chronic thrombus. 6. Chronic deformity of the right scapula likely from an old healed fracture. 7. Aortic atherosclerosis. Aortic Atherosclerosis (ICD10-I70.0). Electronically Signed   By: Van Clines M.D.   On: 01/30/2020 18:18    01/30/2020 Pre-operative Diagnosis: Left thigh abscess Post-operative diagnosis:  Same Surgeon:  Annamarie Major Assistants: Risa Grill Procedure:   #1: I&D of left thigh abscess                         #2: Removal of left thigh dialysis graft Anesthesia: General Blood Loss: 150 cc Specimens: Purulent material sent for anaerobic and aerobic culture  Findings: Multiple grafts were identified.  All were removed that were visible.  I did remove one of the grafts off of the common femoral vein.  There is no residual cuff left on the common femoral vein.  The incision was left open.  This was approximately 10 x 4 x 3 cm   Signed: Lacinda Axon, MD 02/18/2020, 7:09  AM Pager: [phone] Internal Medicine Teaching Service

## 2020-01-31 NOTE — Progress Notes (Signed)
[facility] Progress Note  Subjective: sp removal infected AVG material (old, not in use) from L thigh yest per [facility].   Vitals:   01/30/20 1950 01/30/20 2030 01/30/20 2308 01/31/20 0549  BP: (!) 112/59 (!) 101/55 (!) 105/49 (!) 90/44  Pulse: 71 68 69 65  Resp: '15 16 15 19  '$ Temp: 98.6 F (37 C) 98.5 F (36.9 C) 98.3 F (36.8 C) 98 F (36.7 C)  TempSrc: Oral Other (Comment) Oral   SpO2: 100% 99% 98% 100%  Weight:        Exam:   alert, nad   no jvd  Chest cta bilat  Cor reg no RG  Abd soft ntnd no ascites   Ext no LE edema   Alert, NF, ox3     R groin TDC    OP HD: AF MWF   4h 30mn  350/800   118kg  2/2.25 bath  Hep 3700  R thigh TDC Sensipar 30 mg q HD Hectorol 4 mcg q HD Mircera 225 mcg IV q 2 weeks- last dose 200 mcg on 1/17, due on 1/31   Assessment/ Plan: 1.  SP removal L thigh infected AVG material (old AVG's): on 1/28 yesterday, by VVS. Appreciated assistance. Cx's pend, on IV vanc 2. L flank sinus tract: from site of previous nephrostomy tube. On antibiotics as above.  3.  ESRD:  MWF schedule. Last HD was 1/27. Plan HD today if schedule allows. He is off schedule. Try to get back on on Monday, but not sure census will allow. K+ chronically high, gets kayexalate on weekend days 4.  Hypertension/volume: BP controlled. EDw raised recently. UF as tol.  5.  Anemia: Hgb has been running low since last admission. ESA due 1/31. Pt requesting his full OP dose of IV heparin w/ his IP dialysis, will order.  6.  Metabolic bone disease: Continue sensipar, hectorol and binders. Follow phosphorus/calcium.    [name] 01/31/2020, 11:49 AM   Recent Labs  Lab 01/30/20 0508 01/30/20 1506 01/30/20 1526 01/31/20 0330  K 5.7* 5.1  --  5.3*  BUN 69* 68*  --  75*  CREATININE 11.32* 11.55*  --  12.36*  CALCIUM 8.3* 8.4*  --  8.4*  PHOS 6.0*  --   --  6.5*  HGB 7.3*  --  6.6* 8.1*   Inpatient medications: . calcium acetate  2,668 mg Oral TID WC  .  Chlorhexidine Gluconate Cloth  6 each Topical Q0600  . cinacalcet  30 mg Oral Q M,W,F-HD  . doxercalciferol  4 mcg Intravenous Q M,W,F-HD  . famotidine  40 mg Oral Daily  . levothyroxine  137 mcg Oral Q0600  . melatonin  10 mg Oral QHS  . mupirocin ointment  1 application Nasal BID  . oxyCODONE-acetaminophen  1 tablet Oral Q6H   And  . oxyCODONE  5 mg Oral Q6H  . sodium chloride flush  3 mL Intravenous Q12H  . sodium polystyrene  15 g Oral Once per day on Sun Sat  . traZODone  100 mg Oral QHS  . zolpidem  10 mg Oral QHS   . sodium chloride    . sodium chloride 10 mL/hr at 01/30/20 1027  . vancomycin Stopped (01/29/20 1332)  . vancomycin     sodium chloride, fluticasone, HYDROmorphone (DILAUDID) injection, promethazine, sodium chloride flush

## 2020-01-31 NOTE — Progress Notes (Signed)
Pt's HD Catheter has constant stoppages d/t increased Arterial Presasure; TPS instilled 1 hrs into the tx for 72mn; resumed tx catheter still not working. Dr. RRoney Jaffenotified, he ordered to stop HD tx and TPA . Catheter barely running on BFR=1559mmin.

## 2020-01-31 NOTE — Progress Notes (Signed)
Pt transferred to HD 

## 2020-01-31 NOTE — Progress Notes (Signed)
   ASSESSMENT & PLAN:  [name] is a 55 y.o. male with s/p L thigh I&D and graft excision. PRN pain control PT/OT/OOB Continue local wound care to groin - wet-to-dry dressings twice daily. Vascular team changed dressing this morning.  SUBJECTIVE:  Pain at operative site. No further drainage from I&D.  OBJECTIVE:  BP (!) 90/44 (BP Location: Left Arm)   Pulse 65   Temp 98 F (36.7 C)   Resp 19   Wt 119.4 kg   SpO2 100%   BMI 36.71 kg/m   Intake/Output Summary (Last 24 hours) at 01/31/2020 1016 Last data filed at 01/30/2020 2308 Gross per 24 hour  Intake 1536 ml  Output 75 ml  Net 1461 ml    I&D wound base appears healthy. Repacked wet-to-dry  CBC Latest Ref Rng & Units 01/31/2020 01/30/2020 01/30/2020  WBC 4.0 - 10.5 K/uL 5.0 4.7 4.3  Hemoglobin 13.0 - 17.0 g/dL 8.1(L) 6.6(LL) 7.3(L)  Hematocrit 39.0 - 52.0 % 26.4(L) 23.1(L) 24.5(L)  Platelets 150 - 400 K/uL 115(L) 104(L) 108(L)     CMP Latest Ref Rng & Units 01/31/2020 01/30/2020 01/30/2020  Glucose 70 - 99 mg/dL 78 99 81  BUN 6 - 20 mg/dL 75(H) 68(H) 69(H)  Creatinine 0.61 - 1.24 mg/dL 12.36(H) 11.55(H) 11.32(H)  Sodium 135 - 145 mmol/L 137 137 137  Potassium 3.5 - 5.1 mmol/L 5.3(H) 5.1 5.7(H)  Chloride 98 - 111 mmol/L 96(L) 97(L) 96(L)  CO2 22 - 32 mmol/L '25 25 28  '$ Calcium 8.9 - 10.3 mg/dL 8.4(L) 8.4(L) 8.3(L)  Total Protein 6.5 - 8.1 g/dL - - -  Total Bilirubin 0.3 - 1.2 mg/dL - - -  Alkaline Phos 38 - 126 U/L - - -  AST 15 - 41 U/L - - -  ALT 0 - 44 U/L - - -    Estimated Creatinine Clearance: 9 mL/min (A) (by C-G formula based on SCr of 12.36 mg/dL (H)).  [name]. Stanford Breed, MD [facility] Phone Number: [phone] 01/31/2020 10:16 AM

## 2020-01-31 NOTE — Op Note (Signed)
    Patient name: [name] MRN: [id number] DOB: 12-Oct-1965 Sex: male  01/30/2020 Pre-operative Diagnosis: Left thigh abscess Post-operative diagnosis:  Same Surgeon:  Annamarie Major Assistants: Risa Grill Procedure:   #1: I&D of left thigh abscess   #2: Removal of left thigh dialysis graft Anesthesia: General Blood Loss: 150 cc Specimens: Purulent material sent for anaerobic and aerobic culture  Findings: Multiple grafts were identified.  All were removed that were visible.  I did remove one of the grafts off of the common femoral vein.  There is no residual cuff left on the common femoral vein.  The incision was left open.  This was approximately 10 x 4 x 3 cm  Indications: This is a 55 year old gentleman with end-stage renal disease who has had multiple grafts in his left thigh.  He presented to the hospital with several day history of redness and swelling in the left thigh.  Ultrasound identified a fluid collection.  We discussed going to the operating room for graft removal.  Procedure:  The patient was identified in the holding area and taken to [facility]  The patient was then placed supine on the table. general anesthesia was administered.  The patient was prepped and draped in the usual sterile fashion.  A time out was called and antibiotics were administered.  A PA was necessary to expedite the procedure and assist with technical details.  I initially began by making a elliptical incision incorporating the abscessed area.  Immediately upon making the incision, there was approximately 20 cc of frank pus that was expressed.  This was sent for anaerobic and aerobic culture.  I then explored the wound deeper and identified 2 different dialysis grafts which were occluded.  The grafts were removed up to the point where they were well incorporated.  One of the grafts tracked down to the common femoral vein.  I therefore sharply dissected out the common femoral vein to expose the  anastomosis.  I then placed a Cooley J clamp on the common femoral vein around the anastomosis and remove the graft completely from the common femoral vein.  There was a stent in this area, some of which was able to be removed however there was still retained stent struts.  I then oversewed the common femoral vein with 2 layers of 5-0 Prolene.  When the clamp was released, the wound was hemostatic.  I then continued with cautery and sharp debridement of the wound until all nonviable tissue was removed.  There were no other visible grafts.  The wound was then copiously irrigated.  Hemostasis was achieved.  The wound was packed with Betadine soaked gauze and sterile dressings.  There were no immediate complications.   Disposition: To PACU stable.   Theotis Burrow, M.D., [facility] [place] Office: [phone] Pager:  [phone]

## 2020-02-01 DIAGNOSIS — L02416 Cutaneous abscess of left lower limb: Secondary | ICD-10-CM | POA: Diagnosis not present

## 2020-02-01 LAB — CBC
HCT: 22.2 % — ABNORMAL LOW (ref 39.0–52.0)
HCT: 25 % — ABNORMAL LOW (ref 39.0–52.0)
Hemoglobin: 6.5 g/dL — CL (ref 13.0–17.0)
Hemoglobin: 7.7 g/dL — ABNORMAL LOW (ref 13.0–17.0)
MCH: 27.8 pg (ref 26.0–34.0)
MCH: 28.4 pg (ref 26.0–34.0)
MCHC: 29.3 g/dL — ABNORMAL LOW (ref 30.0–36.0)
MCHC: 30.8 g/dL (ref 30.0–36.0)
MCV: 94.9 fL (ref 80.0–100.0)
MCV: 92.3 fL (ref 80.0–100.0)
Platelets: 103 10*3/uL — ABNORMAL LOW (ref 150–400)
Platelets: 139 10*3/uL — ABNORMAL LOW (ref 150–400)
RBC: 2.34 MIL/uL — ABNORMAL LOW (ref 4.22–5.81)
RBC: 2.71 MIL/uL — ABNORMAL LOW (ref 4.22–5.81)
RDW: 17.8 % — ABNORMAL HIGH (ref 11.5–15.5)
RDW: 17.7 % — ABNORMAL HIGH (ref 11.5–15.5)
WBC: 3.5 10*3/uL — ABNORMAL LOW (ref 4.0–10.5)
WBC: 4.6 10*3/uL (ref 4.0–10.5)
nRBC: 0 % (ref 0.0–0.2)
nRBC: 0 % (ref 0.0–0.2)

## 2020-02-01 LAB — PREPARE RBC (CROSSMATCH)

## 2020-02-01 LAB — RENAL FUNCTION PANEL
Albumin: 2.8 g/dL — ABNORMAL LOW (ref 3.5–5.0)
Anion gap: 14 (ref 5–15)
BUN: 64 mg/dL — ABNORMAL HIGH (ref 6–20)
CO2: 28 mmol/L (ref 22–32)
Calcium: 8.4 mg/dL — ABNORMAL LOW (ref 8.9–10.3)
Chloride: 95 mmol/L — ABNORMAL LOW (ref 98–111)
Creatinine, Ser: 11.64 mg/dL — ABNORMAL HIGH (ref 0.61–1.24)
GFR, Estimated: 5 mL/min — ABNORMAL LOW (ref >60)
Glucose, Bld: 113 mg/dL — ABNORMAL HIGH (ref 70–99)
Phosphorus: 5.7 mg/dL — ABNORMAL HIGH (ref 2.5–4.6)
Potassium: 4.8 mmol/L (ref 3.5–5.1)
Sodium: 137 mmol/L (ref 135–145)

## 2020-02-01 LAB — HEMOGLOBIN AND HEMATOCRIT, BLOOD
HCT: 26.2 % — ABNORMAL LOW (ref 39.0–52.0)
Hemoglobin: 7.7 g/dL — ABNORMAL LOW (ref 13.0–17.0)

## 2020-02-01 LAB — GLUCOSE, CAPILLARY: Glucose-Capillary: 78 mg/dL (ref 70–99)

## 2020-02-01 MED ORDER — ASPIRIN EC 81 MG PO TBEC
81.0000 mg | DELAYED_RELEASE_TABLET | Freq: Every day | ORAL | Status: DC
  Filled 2020-02-01: qty 1

## 2020-02-01 MED ORDER — HYDROMORPHONE HCL 1 MG/ML IJ SOLN
1.0000 mg | Freq: Once | INTRAMUSCULAR | Status: AC
  Administered 2020-02-01: 1 mg via INTRAVENOUS

## 2020-02-01 MED ORDER — VITAMIN D 25 MCG (1000 UNIT) PO TABS
1000.0000 [IU] | ORAL_TABLET | Freq: Every day | ORAL | Status: DC
  Administered 2020-02-01 – 2020-02-21 (×20): 1000 [IU] via ORAL
  Filled 2020-02-01 (×21): qty 1

## 2020-02-01 MED ORDER — VANCOMYCIN HCL IN DEXTROSE 1-5 GM/200ML-% IV SOLN: 1000.0000 mg | INTRAVENOUS | Status: DC

## 2020-02-01 MED ORDER — SODIUM CHLORIDE 0.9% IV SOLUTION: Freq: Once | INTRAVENOUS | Status: AC

## 2020-02-01 MED ORDER — ASPIRIN 325 MG PO TABS
325.0000 mg | ORAL_TABLET | Freq: Every day | ORAL | Status: DC
  Administered 2020-02-01: 325 mg via ORAL
  Filled 2020-02-01: qty 1

## 2020-02-01 NOTE — Progress Notes (Signed)
   ASSESSMENT & PLAN:  [name] is a 55 y.o. male with s/p L thigh I&D and graft excision. PRN pain control PT/OT/OOB Continue local wound care to groin - wet-to-dry dressings twice daily. Vascular team changed dressing this morning.  SUBJECTIVE:  S/p transfusion. Feels lethargic.   OBJECTIVE:  BP 99/67 (BP Location: Right Arm)   Pulse 68   Temp 98 F (36.7 C) (Oral)   Resp (!) 28   Wt 127.9 kg   SpO2 100%   BMI 39.33 kg/m   Intake/Output Summary (Last 24 hours) at 02/01/2020 1111 Last data filed at 02/01/2020 0658 Gross per 24 hour  Intake 668.5 ml  Output 1116 ml  Net -447.5 ml    I&D wound base appears healthy. Repacked wet-to-dry  CBC Latest Ref Rng & Units 02/01/2020 02/01/2020 01/31/2020  WBC 4.0 - 10.5 K/uL - 3.5(L) 5.0  Hemoglobin 13.0 - 17.0 g/dL 7.7(L) 6.5(LL) 8.1(L)  Hematocrit 39.0 - 52.0 % 26.2(L) 22.2(L) 26.4(L)  Platelets 150 - 400 K/uL - 103(L) 115(L)     CMP Latest Ref Rng & Units 02/01/2020 01/31/2020 01/30/2020  Glucose 70 - 99 mg/dL 113(H) 78 99  BUN 6 - 20 mg/dL 64(H) 75(H) 68(H)  Creatinine 0.61 - 1.24 mg/dL 11.64(H) 12.36(H) 11.55(H)  Sodium 135 - 145 mmol/L 137 137 137  Potassium 3.5 - 5.1 mmol/L 4.8 5.3(H) 5.1  Chloride 98 - 111 mmol/L 95(L) 96(L) 97(L)  CO2 22 - 32 mmol/L '28 25 25  '$ Calcium 8.9 - 10.3 mg/dL 8.4(L) 8.4(L) 8.4(L)  Total Protein 6.5 - 8.1 g/dL - - -  Total Bilirubin 0.3 - 1.2 mg/dL - - -  Alkaline Phos 38 - 126 U/L - - -  AST 15 - 41 U/L - - -  ALT 0 - 44 U/L - - -    Estimated Creatinine Clearance: 9.9 mL/min (A) (by C-G formula based on SCr of 11.64 mg/dL (H)).  [name]. Stanford Breed, MD [facility] Phone Number: [phone] 02/01/2020 11:11 AM

## 2020-02-01 NOTE — Evaluation (Signed)
Physical Therapy Evaluation Patient Details Name: [name] MRN: [id number] DOB: 1965/05/28 Today's Date: 02/01/2020   History of Present Illness  55 y.o male admitted with left abscess s/p I&D; Hx of End Stage Renal Disease on HD, PAD, nephrostomy complication.  Clinical Impression  Pt admitted with above complications. Familiar to me from previous admission following orthopedic issues. Pt states he feels that he is essentially at his baseline with mobility and has been using RW to mobilize. Willing to transfer into chair with therapist today and was able to complete without physical assistance. Willing to walk with therapy tomorrow, still having pain Lt groin from I&D. Ind at home lives with a family member- was receiving HHPT and reports making progress with them, wanting to continue at d/c. Pt currently with functional limitations due to the deficits listed below (see PT Problem List). Pt will benefit from skilled PT to increase their independence and safety with mobility to allow discharge to the venue listed below.       Follow Up Recommendations Home health PT (Continue with HHPT as he states he was making good progress)    Equipment Recommendations  Other (comment) (Requests new reacher)    Recommendations for Other Services       Precautions / Restrictions Precautions Precautions: Fall Restrictions Weight Bearing Restrictions: No      Mobility  Bed Mobility Overal bed mobility: Independent                  Transfers Overall transfer level: Needs assistance Equipment used: None Transfers: Stand Pivot Transfers   Stand pivot transfers: Supervision       General transfer comment: Supervision for safety, pt willing to perform stand pivot transfer. States he has been mobilizing with RW in room.  Ambulation/Gait                Stairs            Wheelchair Mobility    Modified Rankin (Stroke Patients Only)       Balance Overall  balance assessment: Mild deficits observed, not formally tested Sitting-balance support: No upper extremity supported;Feet supported Sitting balance-Leahy Scale: Good                                       Pertinent Vitals/Pain Pain Assessment: Faces Faces Pain Scale: Hurts little more Pain Location: Lt groin Pain Descriptors / Indicators: Discomfort Pain Intervention(s): Monitored during session;Limited activity within patient's tolerance    Home Living Family/patient expects to be discharged to:: Private residence Living Arrangements: Other relatives (sister) Available Help at Discharge: Family;Available PRN/intermittently Type of Home: House Home Access: Stairs to enter (Threshold) Entrance Stairs-Rails: None Entrance Stairs-Number of Steps: 1 Home Layout: One level Home Equipment: Wheelchair - manual;Wheelchair - power;Hospital bed;Walker - 2 wheels;Adaptive equipment ((bari walker))      Prior Function Level of Independence: Independent with assistive device(s)         Comments: States she ambluates throughout house with bari walker, uses w/c at times, getting HHPT prior to admission.     Hand Dominance   Dominant Hand: Right    Extremity/Trunk Assessment   Upper Extremity Assessment Upper Extremity Assessment: Defer to OT evaluation    Lower Extremity Assessment Lower Extremity Assessment: Generalized weakness (Good mobility in LEs however somewhat limited with Lt groin pain)    Cervical / Trunk Assessment Cervical / Trunk Assessment: Kyphotic  Communication   Communication: No difficulties  Cognition Arousal/Alertness: Awake/alert Behavior During Therapy: WFL for tasks assessed/performed;Anxious Overall Cognitive Status: Within Functional Limits for tasks assessed                                 General Comments: Frustrated      General Comments General comments (skin integrity, edema, etc.): Pt denies dizziness, noted  RBCs given earlier and Hgb improved from this AM    Exercises     Assessment/Plan    PT Assessment Patient needs continued PT services  PT Problem List Decreased strength;Decreased mobility;Decreased safety awareness;Decreased activity tolerance;Decreased balance;Decreased range of motion;Decreased knowledge of use of DME;Pain;Decreased skin integrity       PT Treatment Interventions DME instruction;Therapeutic activities;Gait training;Therapeutic exercise;Patient/family education;Balance training;Stair training;Functional mobility training;Neuromuscular re-education    PT Goals (Current goals can be found in the Care Plan section)  Acute Rehab PT Goals Patient Stated Goal: Get well PT Goal Formulation: With patient Time For Goal Achievement: 02/15/20 Potential to Achieve Goals: Good    Frequency Min 3X/week   Barriers to discharge        Co-evaluation               AM-PAC PT "6 Clicks" Mobility  Outcome Measure Help needed turning from your back to your side while in a flat bed without using bedrails?: None Help needed moving from lying on your back to sitting on the side of a flat bed without using bedrails?: None Help needed moving to and from a bed to a chair (including a wheelchair)?: None Help needed standing up from a chair using your arms (e.g., wheelchair or bedside chair)?: None Help needed to walk in hospital room?: A Little Help needed climbing 3-5 steps with a railing? : A Little 6 Click Score: 22    End of Session   Activity Tolerance: Patient limited by pain (Frustrated) Patient left: in chair;with call bell/phone within reach Nurse Communication: Mobility status PT Visit Diagnosis: Other abnormalities of gait and mobility (R26.89);Muscle weakness (generalized) (M62.81);Difficulty in walking, not elsewhere classified (R26.2);Pain Pain - Right/Left: Left Pain - part of body: Leg    Time: [id number] PT Time Calculation (min) (ACUTE ONLY): 20  min   Charges:   PT Evaluation $PT Eval Low Complexity: 1 Low          Ellouise Newer, PT, DPT  Ellouise Newer 02/01/2020, 11:56 AM

## 2020-02-01 NOTE — Plan of Care (Signed)

## 2020-02-01 NOTE — Progress Notes (Signed)
[facility] Progress Note  Subjective: HD yest was not successful  Vitals:   02/01/20 0500 02/01/20 0530 02/01/20 0655 02/01/20 0658  BP:  (!) 91/50 99/67   Pulse:   68   Resp:  (!) 28    Temp:    98 F (36.7 C)  TempSrc:    Oral  SpO2:      Weight: 127.9 kg       Exam:   alert, nad   no jvd  Chest cta bilat  Cor reg no RG  Abd soft ntnd no ascites   Ext no LE edema   Alert, NF, ox3     R groin TDC    OP HD: AF MWF   4h 65mn  350/800   118kg  2/2.25 bath  Hep 3700  R thigh TDC Sensipar 30 mg q HD Hectorol 4 mcg q HD Mircera 225 mcg IV q 2 weeks- last dose 200 mcg on 1/17, due on 1/31   Assessment/ Plan: 1.  SP removal L thigh infected AVG material (old AVG's): on 1/28 yesterday, by VVS. Appreciated assistance. Cx's pend, on IV vanc 2. L flank sinus tract: from site of previous nephrostomy tube. On antibiotics as above.  3.  ESRD:  MWF schedule. Last HD was 1/27. HD 1/29 was not successful as catheter could not be accessed[facility] Will [facility]  4.  HD access: pt has been an end-stage access patient for many years now, f/b VVS for the most part. [place]. TDC patency over the years has been assisted by regular TPA locks and, per patient, ASA and plavix have helped a lot to keep HD cath from clotting. ASA/ plavix are on hold now due to bruising/ low plts / low Hb. As soon as possible would like to resume ASA +/-  Plavix, will help w/ TDC patency.  5.  Hypertension/volume: BP controlled. EDw raised recently. UF as tol.  6.  Anemia: Hgb has been running low since last admission. ESA due 1/31. Pt requesting his full OP dose of IV heparin w/ inpatient HD (= 3700 units).  7.  Metabolic bone disease: Continue sensipar, hectorol and binders. Follow phosphorus/calcium.    Rob Malakye Nolden 02/01/2020, 10:55 AM   Recent Labs  Lab 01/31/20 0330 02/01/20 0108 02/01/20 0758  K 5.3* 4.8   --   BUN 75* 64*  --   CREATININE 12.36* 11.64*  --   CALCIUM 8.4* 8.4*  --   PHOS 6.5* 5.7*  --   HGB 8.1* 6.5* 7.7*   Inpatient medications: . aspirin EC  81 mg Oral Daily  . calcium acetate  2,668 mg Oral TID WC  . Chlorhexidine Gluconate Cloth  6 each Topical Q0600  . cholecalciferol  1,000 Units Oral Daily  . cinacalcet  30 mg Oral Q M,W,F-HD  . doxercalciferol  4 mcg Intravenous Q M,W,F-HD  . famotidine  40 mg Oral Daily  .  HYDROmorphone (DILAUDID) injection  1 mg Intravenous Once  . levothyroxine  137 mcg Oral Q0600  . melatonin  10 mg Oral QHS  . mupirocin ointment  1 application Nasal BID  . oxyCODONE-acetaminophen  1 tablet Oral Q6H   And  . oxyCODONE  5 mg Oral Q6H  . sodium chloride flush  3 mL Intravenous Q12H  . sodium polystyrene  15 g Oral Once per day on Sun Sat  .  traZODone  100 mg Oral QHS  . zolpidem  10 mg Oral QHS   . sodium chloride    . sodium chloride 10 mL/hr at 01/30/20 1027   sodium chloride, fluticasone, HYDROmorphone (DILAUDID) injection, promethazine, sodium chloride flush

## 2020-02-01 NOTE — Progress Notes (Addendum)
Subjective:   Patient reports continued pain to L thigh at surgical site, and R shoulder which are somewhat improved. He denies any further bleeding. No epistaxis, blood in BM. Serosanguinous drainage noted to L thigh from surgical site. No new complaints since prior day.  Objective:  Vital signs in last 24 hours: Vitals:   02/01/20 0330 02/01/20 0340 02/01/20 0355 02/01/20 0430  BP: (!) 91/48 (!) 91/48 (!) 88/43 (!) 92/49  Pulse: 64 64 63 64  Resp:  (!) '21 20 20  ' Temp: 98 F (36.7 C) 98 F (36.7 C) 98 F (36.7 C) 98.1 F (36.7 C)  TempSrc: Oral Oral  Oral  SpO2: 100%     Weight:        Physical Exam Constitutional: no acute distress, non-toxic appearing Head: atraumatic ENT: external ears normal Eyes: EOMI Cardiovascular: regular rate and rhythm, referred bruit, unable to detect other abnormal heart sounds  Pulmonary: effort normal, lungs clear to ascultation bilaterally Abdominal: flat, bowel sounds normal Musculoskeletal: Dressing over left thigh at location of infected graft removal, moderate serosanguinous drainage, left flank site of sinus tract at prior nephrostomy tube with dressing in place and mild amount of milky serous drainage, ecchymosis to lateral right chest which looks unchanged from prior day, smaller areas of ecchymosis to substernal and left chest, tenderness to right deltoid and bicep area Skin: warm and dry Neurological: alert, no focal deficit Psychiatric: anxious  Assessment/Plan: [name] is a 55 y.o. male with hx of ESRD on MWF dialysis, HTN, CAD, severe aortic stenosis, spina bifida, chronic opioid use, L knee septic arthritris presenting with epistaxis, abscess near L thigh fistula, and malodorous discharge from chronic wound due to prior L nephrostomy tube.  Now status post removal of infected graft.  Principal Problem:   Abscess of left thigh Active Problems:   End stage renal disease (HCC)   PAD (peripheral artery disease) (HCC)    Nephrostomy complication (HCC)  Left Thigh Abscess Fluid around old non-functional dialysis graft of left thigh POD 2. Presented with small abscess of the left thigh as well as deeper fluid collection around nonfunctioning graft for dialysis.  This was removed by vascular surgery on 1/28, cultures pending - Vascular surgery consultation, appreciate assistance.  - stop vancomycin since he has source control, completed 5 days - Dilaudid 1 mg IV q3, for breakthrough pain - f/u blood cultures, NGTD - f/u surgical culture, NGTD  ESRD on MWF dialysis Impaired dialysis access Hyperkalemia Patient is well-known to the vascular surgery department and has exhausted most of his access sites. Had previous access to upper extremities and in the left lower extremity, now has a right femoral tunneled dialysis catheter. Has had adequate flow recently, but yesterday had poor HD with constant stoppages. Attempted TPS but still poor flow afterwards.  Restarting ASA at 325 as this has helped with his dialysis in the past. He presented with some epistaxis and bruising, but these have stabilized, and impaired dialysis access is a greater risk. Nephro agreed with this assessment as well, and patient is agreeable. -restart ASA at 357m (home dose of 325) -dialysis per nephro -Continue home PhosLo -Daily BMP -Nephro diet -Avoid nephrotoxins -Kayexalate/Lokelma as needed.  Brought his home Kayexalate which he prefers due to taste.  Left flank sinus tract  Several month history of drainage from previous left nephrostomy tube placed.  He had a CT renal study in October 2021 that did show a sinus tract leading to the skin, however no evidence  of abscess.    Repeat CT scan this admission shows the same with no fluid collection. Culture showed multiple organisms, no staph aureus, no GAS. - urology consulted, appreciate recommendations     -No need for urgent surgery, ultimate treatment will be a nephrectomy which  patient does not wish to have.  Follow-up with his urologist, Dr. Tresa Moore, outpatient     -Dressing changes daily as needed     -Wound care consult  Spontaneous Ecchymosis Epistaxis - resolved Ecchymosis on bilateral chest without known trauma, R-sided ecchymosis has been increasing. Pain of R arm more so than chest, though both areas are tender. Platelets were okay at 109,000. Checking labs for bleeding disorder. He is noted to be on full strength ASA and Plavix at home, and uremia from ESRD also likely contributing. Also gets heparin at dialysis, but low concern for HIT given normal PT, INR, and PTT. Uremia also elevated with his ESRD, so could be an element of platelet dysfunction. Workup for bleeding dyscrasia largely unremarkable at this time. Smear showed normocytic anemia.  Mild elevations in ESR and CRP which is nonspecific. Negative HepC, C4, C3. Von willebrand panel elevated which is not consistent with vWF, could be due to rise as acute phase reactants. ANCA and MPO negative. ASA and Plavix were held in light if active bleeding.  -Hemoglobin dropped from 8.1 > 6.5 overnight, possibly 2/2 HD yesterday. No other source of bleeding today. Transfused 1 unit, f/u CBC of 7.7  -restart ASA at 31m to help maintain HD access -daily CBC -transfuse as needed to maintain hgb > 7 -f/u afternoon CBC since we restarted ASA 325 this morning -need chart review further about history of clots/bleeding**  R shoulder pain Vitamin D deficiency Possibly related to the painful ecchymosis on his right chest, though he is complaining more of pain to the right arm.  On exam he is very tender but there is no skin or soft tissue abnormality except for old graft. CT of right shoulder with joint effusion which could be hemarthrosis, similar effusion seen on 10/05/2019. lucent/lytic lesions in the right humeral head potentially presenting brown tumors, and white deposition, or subperiosteal bone absorption.  Could be  secondary to renal osteodystrophy.  Also mild subscapularis atrophy, fluid in subacromial subdeltoid bursa which indicate rotator cuff tear, and chronic deformity of scapula likely reflecting old healed fracture. -start vitamin D supplementation 50,000 units weekly -f/u PTH -pain control as above  Chronic pain syndrome -continue home oxycodone [id number] prn -pain control for acute problem as above  Non-obstructive CAD -holding Plavix at this time for acute bleeding, ASA restarted at 8131mDiet:  renal IVF:  none VTE:  SCDs Prior to Admission Living Arrangement:  home Anticipated Discharge Location:  home Barriers to Discharge:  Surgery complication of dialysis graft Dispo: Anticipated discharge in approximately 1-3 day(s).   CheAndrew AuD 02/01/2020, 6:21 AM Pager: [phone] 5pm on weekdays and 1pm on weekends: On Call pager [phone]

## 2020-02-01 NOTE — Progress Notes (Signed)
Pharmacy Antibiotic Note  [name] is a 55 y.o. male admitted on 01/27/2020 with wound infection.  Pharmacy has been consulted for vancomycin dosing. WBC 3.5, afebrile. This is day 5 on vancomycin. H/o MWF HD. Pt received hemodialysis x 2.5 hours on 1/29 at a BFR between 150-200. Per RN note, there were constant stoppages and catheter was barely running. Per nephrology- will plan to put patient back on PTA HD regimen - MWF.   Plan: -Will not redose vancomycin for short HD session at reduced BFR  -f/u further HD plans / schedule -Monitor CBC, renal fx, cultures and clinical progress -VT at steady state  Weight: 127.9 kg (281 lb 15.5 oz)  Temp (24hrs), Avg:98.2 F (36.8 C), Min:98 F (36.7 C), Max:98.9 F (37.2 C)  Recent Labs  Lab 01/29/20 0625 01/30/20 0508 01/30/20 1506 01/30/20 1526 01/31/20 0330 02/01/20 0108  WBC 5.7 4.3  --  4.7 5.0 3.5*  CREATININE 13.00* 11.32* 11.55*  --  12.36* 11.64*    Estimated Creatinine Clearance: 9.9 mL/min (A) (by C-G formula based on SCr of 11.64 mg/dL (H)).    Allergies  Allergen Reactions  . Hydrocodone Other (See Comments)    Caused involuntary movement and twitching. CANNOT TAKE DUE TO MUSCLE SPASMS AND MUSCLE TREMORS  . Furadantin [Nitrofurantoin] Other (See Comments)    UNSPECIFIED REACTION   . Mandelamine [Methenamine] Other (See Comments)    UNSPECIFIED REACTION   . Noroxin [Norfloxacin] Other (See Comments)    UNSPECIFIED REACTION   . Colace [Docusate] Diarrhea and Nausea And Vomiting  . Elemental Sulfur Cough    Childhood reaction - pt could not confirm that it was a cough  . Sulfa Antibiotics Other (See Comments) and Cough    Childhood reaction - pt could not confirm that it was a cough    Antimicrobials this admission: Vanc 1/26 >>  Cefepime x 1 1/26  Dose adjustments this admission:   Microbiology results: 1/26 BCx: no growth 1/26 WCx: multiple species- no staph aureus, no group a strep 1/27: BCx no growth  2 days 1/28: WCx: no growth 24h  1/28: fungus culture sent  Thank you for allowing pharmacy to be a part of this patient's care.  Wilson Singer, PharmD PGY1 Pharmacy Resident 02/01/2020 7:33 AM

## 2020-02-02 DIAGNOSIS — L02416 Cutaneous abscess of left lower limb: Secondary | ICD-10-CM | POA: Diagnosis not present

## 2020-02-02 LAB — CBC WITH DIFFERENTIAL/PLATELET
Abs Immature Granulocytes: 0.01 10*3/uL (ref 0.00–0.07)
Basophils Absolute: 0 10*3/uL (ref 0.0–0.1)
Basophils Relative: 0 %
Eosinophils Absolute: 0.3 10*3/uL (ref 0.0–0.5)
Eosinophils Relative: 7 %
HCT: 23.2 % — ABNORMAL LOW (ref 39.0–52.0)
Hemoglobin: 7.3 g/dL — ABNORMAL LOW (ref 13.0–17.0)
Immature Granulocytes: 0 %
Lymphocytes Relative: 13 %
Lymphs Abs: 0.5 10*3/uL — ABNORMAL LOW (ref 0.7–4.0)
MCH: 29.3 pg (ref 26.0–34.0)
MCHC: 31.5 g/dL (ref 30.0–36.0)
MCV: 93.2 fL (ref 80.0–100.0)
Monocytes Absolute: 0.4 10*3/uL (ref 0.1–1.0)
Monocytes Relative: 11 %
Neutro Abs: 2.8 10*3/uL (ref 1.7–7.7)
Neutrophils Relative %: 69 %
Platelets: 131 10*3/uL — ABNORMAL LOW (ref 150–400)
RBC: 2.49 MIL/uL — ABNORMAL LOW (ref 4.22–5.81)
RDW: 17.5 % — ABNORMAL HIGH (ref 11.5–15.5)
WBC: 4.1 10*3/uL (ref 4.0–10.5)
nRBC: 0 % (ref 0.0–0.2)

## 2020-02-02 LAB — TYPE AND SCREEN
ABO/RH(D): A POS
Antibody Screen: NEGATIVE
Unit division: 0
Unit division: 0

## 2020-02-02 LAB — CULTURE, BLOOD (ROUTINE X 2): Culture: NO GROWTH

## 2020-02-02 LAB — RENAL FUNCTION PANEL
Albumin: 2.8 g/dL — ABNORMAL LOW (ref 3.5–5.0)
Anion gap: 14 (ref 5–15)
BUN: 74 mg/dL — ABNORMAL HIGH (ref 6–20)
CO2: 28 mmol/L (ref 22–32)
Calcium: 8.3 mg/dL — ABNORMAL LOW (ref 8.9–10.3)
Chloride: 95 mmol/L — ABNORMAL LOW (ref 98–111)
Creatinine, Ser: 13.43 mg/dL — ABNORMAL HIGH (ref 0.61–1.24)
GFR, Estimated: 4 mL/min — ABNORMAL LOW (ref >60)
Glucose, Bld: 98 mg/dL (ref 70–99)
Phosphorus: 6.1 mg/dL — ABNORMAL HIGH (ref 2.5–4.6)
Potassium: 5 mmol/L (ref 3.5–5.1)
Sodium: 137 mmol/L (ref 135–145)

## 2020-02-02 LAB — BPAM RBC
Blood Product Expiration Date: 202202162359
Blood Product Expiration Date: 202202282359
ISSUE DATE / TIME: 202201282009
ISSUE DATE / TIME: 202201300328
Unit Type and Rh: 6200
Unit Type and Rh: 6200

## 2020-02-02 LAB — FUNGUS STAIN

## 2020-02-02 LAB — PTH, INTACT AND CALCIUM
Calcium, Total (PTH): 8.5 mg/dL — ABNORMAL LOW (ref 8.7–10.2)
PTH: 281 pg/mL — ABNORMAL HIGH (ref 15–65)

## 2020-02-02 LAB — LACTATE DEHYDROGENASE: LDH: 121 U/L (ref 98–192)

## 2020-02-02 MED ORDER — CLOPIDOGREL BISULFATE 75 MG PO TABS: 75.0000 mg | ORAL_TABLET | Freq: Every day | ORAL | Status: DC

## 2020-02-02 MED ORDER — HYDROMORPHONE HCL 1 MG/ML IJ SOLN
0.5000 mg | Freq: Once | INTRAMUSCULAR | Status: DC
  Filled 2020-02-02: qty 1

## 2020-02-02 MED ORDER — DESMOPRESSIN ACETATE 4 MCG/ML IJ SOLN: 0.3000 ug/kg | Freq: Once | INTRAMUSCULAR | Status: DC

## 2020-02-02 MED ORDER — ALTEPLASE 2 MG IJ SOLR
INTRAMUSCULAR | Status: AC
  Administered 2020-02-02: 2 mg
  Filled 2020-02-02: qty 4

## 2020-02-02 MED ORDER — CLOPIDOGREL BISULFATE 75 MG PO TABS
75.0000 mg | ORAL_TABLET | Freq: Every day | ORAL | Status: DC
  Administered 2020-02-02 – 2020-02-21 (×19): 75 mg via ORAL
  Filled 2020-02-02 (×20): qty 1

## 2020-02-02 MED ORDER — ASPIRIN EC 81 MG PO TBEC
81.0000 mg | DELAYED_RELEASE_TABLET | Freq: Every day | ORAL | Status: DC
  Administered 2020-02-02 – 2020-02-21 (×19): 81 mg via ORAL
  Filled 2020-02-02 (×19): qty 1

## 2020-02-02 MED ORDER — DOXERCALCIFEROL 4 MCG/2ML IV SOLN
INTRAVENOUS | Status: AC
  Filled 2020-02-02: qty 2

## 2020-02-02 MED ORDER — SODIUM CHLORIDE 0.9 % IV SOLN
28.0000 ug | Freq: Once | INTRAVENOUS | Status: AC
  Administered 2020-02-02: 28 ug via INTRAVENOUS
  Filled 2020-02-02: qty 7

## 2020-02-02 MED ORDER — ALTEPLASE 2 MG IJ SOLR
INTRAMUSCULAR | Status: AC
  Filled 2020-02-02: qty 4

## 2020-02-02 MED ORDER — ASPIRIN EC 81 MG PO TBEC: 81.0000 mg | DELAYED_RELEASE_TABLET | Freq: Every day | ORAL | Status: DC

## 2020-02-02 NOTE — Progress Notes (Addendum)
Paged by RN for bleeding at graft site. Tried conservative management with changing dressing and applying pressure. However, paged again for continued bleeding despite holding pressure at site for >15 minutes. Went to assess patient, he is hemodynamically stable and asymptomatic. Removed outer dressing and patient does have significant amount of bleeding present. Discussed with RN to redress wounds and hold pressure again. Will contact vascular to come see patient as he had an I&D and graft excision at that location 2 days ago by Dr. Trula Slade.  Plan: -f/u STAT cbc -d/w Dr. Stanford Breed, vascular surgery, he will go see patient

## 2020-02-02 NOTE — Progress Notes (Signed)
   Discussed with Dr Serafina Royals pt needs per Dr Marval Regal.  Dr Serafina Royals willing to exchange Rt femoral tunneled cath in IR today  Went to consent pt--- he refused IR involvement "Only VVS can work on this catheter"  per pt.  LM for Dr Marval Regal

## 2020-02-02 NOTE — Progress Notes (Signed)
Asked to evaluate for bleeding from wounds. Dressing taken down. Small area of ooze noted. Resolved with tight packing with dry gauze. Suspect this is uremic ooze and will improve after HD. Give DDAVP and re-dress as needed. Document dressing changes.  [name]. Stanford Breed, MD [facility] Phone Number: [phone] 02/02/2020 1:25 AM

## 2020-02-02 NOTE — Procedures (Signed)
I was present at this dialysis session. I have reviewed the session itself and made appropriate changes.   Vital signs in last 24 hours:  Temp:  [98 F (36.7 C)-98.9 F (37.2 C)] 98.4 F (36.9 C) (01/31 0830) Pulse Rate:  [66-75] 75 (01/31 0830) Resp:  [13-20] 13 (01/31 0532) BP: (105-133)/(50-65) 133/50 (01/31 0830) SpO2:  [94 %-100 %] 94 % (01/30 2117) Weight:  [124.5 kg-126.9 kg] 124.5 kg (01/31 0830) Weight change: 4.1 kg Filed Weights   02/01/20 0500 02/02/20 0500 02/02/20 0830  Weight: 127.9 kg 126.9 kg 124.5 kg    Recent Labs  Lab 02/02/20 0101  NA 137  K 5.0  CL 95*  CO2 28  GLUCOSE 98  BUN 74*  CREATININE 13.43*  CALCIUM 8.3*  PHOS 6.1*    Recent Labs  Lab 01/28/20 0523 01/29/20 0625 01/31/20 0330 02/01/20 0108 02/01/20 0758 02/01/20 1640 02/02/20 0101  WBC 6.0   < > 5.0 3.5*  --  4.6 4.1  NEUTROABS 4.9  --  3.5  --   --   --  2.8  HGB 7.9*   < > 8.1* 6.5* 7.7* 7.7* 7.3*  HCT 26.4*   < > 26.4* 22.2* 26.2* 25.0* 23.2*  MCV 96.0   < > 93.6 94.9  --  92.3 93.2  PLT 109*   < > 115* 103*  --  139* 131*   < > = values in this interval not displayed.    Scheduled Meds: . alteplase      . aspirin  325 mg Oral Daily  . calcium acetate  2,668 mg Oral TID WC  . Chlorhexidine Gluconate Cloth  6 each Topical Q0600  . cholecalciferol  1,000 Units Oral Daily  . cinacalcet  30 mg Oral Q M,W,F-HD  . doxercalciferol  4 mcg Intravenous Q M,W,F-HD  . famotidine  40 mg Oral Daily  .  HYDROmorphone (DILAUDID) injection  0.5 mg Intravenous Once  . levothyroxine  137 mcg Oral Q0600  . melatonin  10 mg Oral QHS  . mupirocin ointment  1 application Nasal BID  . oxyCODONE-acetaminophen  1 tablet Oral Q6H   And  . oxyCODONE  5 mg Oral Q6H  . sodium chloride flush  3 mL Intravenous Q12H  . sodium polystyrene  15 g Oral Once per day on Sun Sat  . traZODone  100 mg Oral QHS  . zolpidem  10 mg Oral QHS   Continuous Infusions: . sodium chloride    . sodium chloride 10  mL/hr at 01/30/20 1027   PRN Meds:.sodium chloride, fluticasone, HYDROmorphone (DILAUDID) injection, promethazine, sodium chloride flush      OP HD: AF MWF   4h 32mn  350/800   118kg  2/2.25 bath  Hep 3700  R thigh TDC Sensipar 30 mg q HD Hectorol 4 mcg q HD Mircera 225 mcg IV q 2 weeks- last dose 200 mcg on 1/17, due on 1/31   Assessment/ Plan: 1. SP removal L thigh infected AVG material (old AVG's): on 1/28 yesterday, by VVS. Appreciated assistance. Cx's pend, on IV vanc 2. L flank sinus tract:from site of previous nephrostomy tube. On antibiotics as above.  3. ESRD:MWF schedule. Last HD was 1/27. HD 1/29 was not successful as catheter could not be accessed[facility].  4.  HD access: pt has been an end-stage access patient for many years now, f/b VVS for the most part. His [place]  [place]. TDC patency over the years has been assisted by regular TPA locks and, per patient, ASA and plavix have helped a lot to keep HD cath from clotting. ASA/ plavix are on hold now due to bruising/ low plts / low Hb. As soon as possible would like to resume ASA +/-  Plavix, will help w/ TDC patency.  5. Hypertension/volume:BP controlled. EDw raised recently. UF as tol.  6. Anemia:Hgb has been running low since last admission. ESA due 1/31. Pt requesting his full OP dose of IV heparin w/ inpatient HD (= 3700 units).  7. Metabolic bone disease:Continue sensipar, hectorol and binders. Follow phosphorus/calcium.   Donetta Potts,  MD 02/02/2020, 9:12 AM

## 2020-02-02 NOTE — Care Management Important Message (Signed)
Important Message  Patient Details  Name: [name] MRN: [id number] Date of Birth: 11-Jul-1965   Medicare Important Message Given:  Yes     [name] 02/02/2020, 4:06 PM

## 2020-02-02 NOTE — Progress Notes (Addendum)
Subjective:   Some pain today at right shoulder. He had some bleeding overnight at the site where his av fistula was removed, he states this has decreased significantly but required three dressing changes.  He denies shortness of breath or dizziness.  He went to HD but had to end an hour early due to clotting at right fem HD site.   Objective:  Vital signs in last 24 hours: Vitals:   02/02/20 0900 02/02/20 1000 02/02/20 1019 02/02/20 1030  BP: (!) 84/68 (!) 98/48 (!) 102/52 (!) 95/31  Pulse:      Resp:  11 10   Temp:    97.8 F (36.6 C)  TempSrc:    Oral  SpO2:   98%   Weight:   124.5 kg     Physical Exam Constitutional: no acute distress, non-toxic appearing Head: atraumatic ENT: external ears normal Eyes: EOMI Cardiovascular: regular rate and rhythm, systolic murmur IV/V, no LE edema  Pulmonary: effort normal, lungs clear to ascultation bilaterally Abdominal: flat, bowel sounds normal Musculoskeletal: Dressing over left thigh at location of infected graft removal with some strike through, left flank site of sinus tract at prior nephrostomy tube with dressing in place and mild amount of milky serous drainage, ecchymosis to lateral right chest which looks unchanged from prior day, smaller areas of ecchymosis to substernal and left chest, tenderness to right deltoid and bicep area Skin: warm and dry Neurological: alert, no focal deficit Psychiatric: normal affect  Assessment/Plan: [name] is a 55 y.o. male with hx of ESRD on MWF dialysis, HTN, CAD, severe aortic stenosis, spina bifida, chronic opioid use, L knee septic arthritris presenting with epistaxis, abscess near L thigh fistula, and malodorous discharge from chronic wound due to prior L nephrostomy tube.  Now status post removal of infected graft.  Principal Problem:   Abscess of left thigh Active Problems:   End stage renal disease (HCC)   PAD (peripheral artery disease) (HCC)   Nephrostomy complication  (HCC)  Left Thigh Abscess Fluid around old non-functional dialysis graft of left thigh POD 2. Presented with small abscess of the left thigh as well as deeper fluid collection around nonfunctioning graft for dialysis.  This was removed by vascular surgery on 1/28, would cultures pending. Blood cultures have remained negative  - Vascular surgery consultation, appreciate assistance.  - last dose of vanc today with HD - Dilaudid 1 mg IV q3, for breakthrough pain - f/u surgical culture, NGTD  ESRD on MWF dialysis Impaired dialysis access Hyperkalemia Patient is well-known to the vascular surgery department and has exhausted most of his access sites. Had previous access to upper extremities and in the left lower extremity, now has a right femoral tunneled dialysis catheter. Has had adequate flow recently, but yesterday and today had poor HD with constant stoppages.  Had bleeding overnight from graft site after restarting asa 325, received desmopressin. Have discussed with nephrology and vascular, and we will try plavix + baby asa as aspirin can increase risk of platelet dysfunction in uremic thrombocytopenia.  -restart asa 81 mg and plavix 75 mg  -dialysis per nephro -Continue home PhosLo -Daily BMP -Nephro diet -Avoid nephrotoxins -Kayexalate/Lokelma as needed.  Brought his home Kayexalate which he prefers as he feels this helps his K more   Spontaneous Ecchymosis Epistaxis - resolved AoC Anemia 2/2 CKD and IDA Thrombocytopenia  Ecchymosis on bilateral chest without known trauma, stable. CT right shoulder showing likely hemarthrosis, present but increased since CT coronary in October. Hx  of left knee hemarthrosis. On asa 325 and plavix 75 due to recurrent clotting and loss of multiple HD sites, he does not have many access site options left.  PTT, INR, vasculitis work-up, VWD labs all unremarkable. Platelet function assay did show platelet dysfunction, but this can be present in anemia and  thrombocytopenia, which is present.  Baseline plts ~150, has been 100s this admission although this is trending up. Received desmopressin yesterday evening due to bleeding from AV surgery site.  Overall, bleeding thought to be secondary to uremic thrombocytopenia and due to dual antiplatelet therapy.  - stop asa 325, start asa '81mg'$  and plavix 75 mg as discussed above. [name] is thought to be more likely to exacerbate platelet dysfunction in uremic thrombocytopenia but antiplatelet therapy is necessary to maintain HD access.  -daily CBC  -transfuse for Hgb <7 -will need iron transfusion once antibiotics completed   Left flank sinus tract  Several month history of drainage from previous left nephrostomy tube. CT scan this admission and in Oct 2021 shows no fluid collection or sign of infection. Culture showed multiple organisms, no staph aureus, no GAS. - per urology, no need for urgent surgery, ultimate treatment will be a nephrectomy which patient does not wish to have.  Follow-up with his urologist, Dr. Tresa Moore, outpatient -Dressing changes daily as needed -Wound care consulted  R shoulder pain Likely hemarthrosis on CT. 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 He did have uric acid crystal on knee arthrocentesis during previous admission.   - ortho consulted for arthrocentesis, appreciate assistance and recs   Vitamin D deficiency Metabolic Bone Disease with Secondary Hyperparathryoidism On hecterol w/HD, sensipar, and binders  -start vitamin D supplementation 50,000 units weekly -f/u PTH -trending   Chronic pain syndrome -continue home oxycodone '10mg'$  q6hr prn -pain control for acute problem as above  Diet:  renal IVF:  none VTE:  SCDs Prior to Admission Living Arrangement:   home Anticipated Discharge Location:  home Barriers to Discharge:  Surgery complication of dialysis graft, HD access Dispo: Anticipated discharge in approximately 1-3 day(s).   [name] A, DO 02/02/2020, 12:20 PM Pager: [phone] After 5pm on weekdays and 1pm on weekends: On Call pager [phone]

## 2020-02-02 NOTE — Progress Notes (Signed)
Called back to see pt in HD unit.  Had significant difficulty with right fem HD catheter today and had to end HD an hour early due to inability to obtain adequate BFR despite activase.  Will need to discuss with VVS and IR as he has very limited access options.

## 2020-02-02 NOTE — Progress Notes (Signed)
  Progress Note    02/02/2020 7:42 AM 3 Days Post-Op  Subjective:  Complains of R shoulder and L leg incision pain.  States dressing was changed this morning and won't need to be changed again until after HD.   Vitals:   02/01/20 2117 02/02/20 0532  BP: 131/65 (!) 105/56  Pulse: 72 66  Resp: 20 13  Temp: 98.9 F (37.2 C) 98 F (36.7 C)  SpO2: 94%    Physical Exam: Lungs:  Non labored Incisions:  Dressing left in place Extremities:  R femoral TDC unremarkable Neurologic: A&O  CBC    Component Value Date/Time   WBC 4.1 02/02/2020 0101   RBC 2.49 (L) 02/02/2020 0101   HGB 7.3 (L) 02/02/2020 0101   HCT 23.2 (L) 02/02/2020 0101   PLT 131 (L) 02/02/2020 0101   MCV 93.2 02/02/2020 0101   MCH 29.3 02/02/2020 0101   MCHC 31.5 02/02/2020 0101   RDW 17.5 (H) 02/02/2020 0101   LYMPHSABS 0.5 (L) 02/02/2020 0101   MONOABS 0.4 02/02/2020 0101   EOSABS 0.3 02/02/2020 0101   BASOSABS 0.0 02/02/2020 0101    BMET    Component Value Date/Time   NA 137 02/02/2020 0101   NA 135 (A) 01/17/2018 0000   K 5.0 02/02/2020 0101   CL 95 (L) 02/02/2020 0101   CO2 28 02/02/2020 0101   GLUCOSE 98 02/02/2020 0101   BUN 74 (H) 02/02/2020 0101   BUN 67 (A) 01/17/2018 0000   CREATININE 13.43 (H) 02/02/2020 0101   CALCIUM 8.3 (L) 02/02/2020 0101   GFRNONAA 4 (L) 02/02/2020 0101   GFRAA 10 (L) 10/07/2019 0430    INR    Component Value Date/Time   INR 1.1 01/28/2020 0434     Intake/Output Summary (Last 24 hours) at 02/02/2020 0742 Last data filed at 02/02/2020 0425 Gross per 24 hour  Intake 50 ml  Output 500 ml  Net -450 ml     Assessment/Plan:  55 y.o. male is s/p excision of L thigh AVG 3 Days Post-Op   Continue wound care with wet to dry dressing changes Dialysis access limited; will check back to see how femoral TDC functions during HD today PRN pain control   Dagoberto Ligas, PA-C [facility] [phone] 02/02/2020 7:42 AM

## 2020-02-03 ENCOUNTER — Inpatient Hospital Stay (HOSPITAL_COMMUNITY)
Admission: EM | Disposition: A | Discharge status: Home Health | Payer: Self-pay | Source: Home / Self Care | Attending: Internal Medicine

## 2020-02-03 DIAGNOSIS — D691 Qualitative platelet defects: Secondary | ICD-10-CM

## 2020-02-03 DIAGNOSIS — L02416 Cutaneous abscess of left lower limb: Secondary | ICD-10-CM | POA: Diagnosis not present

## 2020-02-03 DIAGNOSIS — M10312 Gout due to renal impairment, left shoulder: Secondary | ICD-10-CM

## 2020-02-03 DIAGNOSIS — Z992 Dependence on renal dialysis: Secondary | ICD-10-CM | POA: Diagnosis not present

## 2020-02-03 DIAGNOSIS — N186 End stage renal disease: Secondary | ICD-10-CM | POA: Diagnosis not present

## 2020-02-03 DIAGNOSIS — M10311 Gout due to renal impairment, right shoulder: Secondary | ICD-10-CM

## 2020-02-03 DIAGNOSIS — T82898A Other specified complication of vascular prosthetic devices, implants and grafts, initial encounter: Secondary | ICD-10-CM | POA: Diagnosis not present

## 2020-02-03 DIAGNOSIS — T829XXD Unspecified complication of cardiac and vascular prosthetic device, implant and graft, subsequent encounter: Secondary | ICD-10-CM | POA: Diagnosis not present

## 2020-02-03 HISTORY — PX: A/V FISTULAGRAM: CATH118298

## 2020-02-03 LAB — RENAL FUNCTION PANEL
Albumin: 3.1 g/dL — ABNORMAL LOW (ref 3.5–5.0)
Anion gap: 14 (ref 5–15)
BUN: 75 mg/dL — ABNORMAL HIGH (ref 6–20)
CO2: 27 mmol/L (ref 22–32)
Calcium: 8.7 mg/dL — ABNORMAL LOW (ref 8.9–10.3)
Chloride: 97 mmol/L — ABNORMAL LOW (ref 98–111)
Creatinine, Ser: 13.73 mg/dL — ABNORMAL HIGH (ref 0.61–1.24)
GFR, Estimated: 4 mL/min — ABNORMAL LOW (ref >60)
Glucose, Bld: 109 mg/dL — ABNORMAL HIGH (ref 70–99)
Phosphorus: 6.2 mg/dL — ABNORMAL HIGH (ref 2.5–4.6)
Potassium: 5.4 mmol/L — ABNORMAL HIGH (ref 3.5–5.1)
Sodium: 138 mmol/L (ref 135–145)

## 2020-02-03 LAB — CULTURE, BLOOD (ROUTINE X 2)
Culture: NO GROWTH
Special Requests: ADEQUATE

## 2020-02-03 LAB — CBC
HCT: 23.8 % — ABNORMAL LOW (ref 39.0–52.0)
Hemoglobin: 7 g/dL — ABNORMAL LOW (ref 13.0–17.0)
MCH: 27.9 pg (ref 26.0–34.0)
MCHC: 29.4 g/dL — ABNORMAL LOW (ref 30.0–36.0)
MCV: 94.8 fL (ref 80.0–100.0)
Platelets: 150 10*3/uL (ref 150–400)
RBC: 2.51 MIL/uL — ABNORMAL LOW (ref 4.22–5.81)
RDW: 17.4 % — ABNORMAL HIGH (ref 11.5–15.5)
WBC: 4.7 10*3/uL (ref 4.0–10.5)
nRBC: 0 % (ref 0.0–0.2)

## 2020-02-03 LAB — GLUCOSE, CAPILLARY: Glucose-Capillary: 78 mg/dL (ref 70–99)

## 2020-02-03 LAB — HAPTOGLOBIN: Haptoglobin: 149 mg/dL (ref 29–370)

## 2020-02-03 LAB — C-REACTIVE PROTEIN: CRP: 11 mg/dL — ABNORMAL HIGH (ref <1.0)

## 2020-02-03 LAB — SEDIMENTATION RATE: Sed Rate: 99 mm/hr — ABNORMAL HIGH (ref 0–16)

## 2020-02-03 SURGERY — A/V FISTULAGRAM
Anesthesia: LOCAL | Laterality: Right

## 2020-02-03 MED ORDER — MIDAZOLAM HCL 2 MG/2ML IJ SOLN
INTRAMUSCULAR | Status: DC | PRN
  Administered 2020-02-03 (×2): 1 mg via INTRAVENOUS

## 2020-02-03 MED ORDER — HEPARIN SODIUM (PORCINE) 1000 UNIT/ML DIALYSIS: 20.0000 [IU]/kg | INTRAMUSCULAR | Status: DC | PRN

## 2020-02-03 MED ORDER — HEPARIN SODIUM (PORCINE) 1000 UNIT/ML IJ SOLN
INTRAMUSCULAR | Status: AC
  Filled 2020-02-03: qty 1

## 2020-02-03 MED ORDER — HEPARIN (PORCINE) IN NACL 1000-0.9 UT/500ML-% IV SOLN
INTRAVENOUS | Status: AC
  Filled 2020-02-03: qty 500

## 2020-02-03 MED ORDER — FENTANYL CITRATE (PF) 100 MCG/2ML IJ SOLN
INTRAMUSCULAR | Status: AC
  Filled 2020-02-03: qty 2

## 2020-02-03 MED ORDER — HYDROMORPHONE HCL 1 MG/ML IJ SOLN
2.0000 mg | INTRAMUSCULAR | Status: DC | PRN
  Administered 2020-02-03 – 2020-02-12 (×50): 2 mg via INTRAVENOUS
  Filled 2020-02-03 (×52): qty 2

## 2020-02-03 MED ORDER — MIDAZOLAM HCL 2 MG/2ML IJ SOLN
INTRAMUSCULAR | Status: AC
  Filled 2020-02-03: qty 2

## 2020-02-03 MED ORDER — LIDOCAINE HCL (PF) 1 % IJ SOLN
INTRAMUSCULAR | Status: AC
  Filled 2020-02-03: qty 30

## 2020-02-03 MED ORDER — IODIXANOL 320 MG/ML IV SOLN
INTRAVENOUS | Status: DC | PRN
  Administered 2020-02-03: 15 mL via INTRA_ARTERIAL

## 2020-02-03 MED ORDER — SODIUM ZIRCONIUM CYCLOSILICATE 5 G PO PACK
5.0000 g | PACK | Freq: Once | ORAL | Status: AC
  Administered 2020-02-03: 5 g via ORAL
  Filled 2020-02-03: qty 1

## 2020-02-03 MED ORDER — ALTEPLASE 2 MG IJ SOLR
INTRAMUSCULAR | Status: AC
  Administered 2020-02-03: 2 mg
  Filled 2020-02-03: qty 4

## 2020-02-03 MED ORDER — HEPARIN (PORCINE) IN NACL 1000-0.9 UT/500ML-% IV SOLN
INTRAVENOUS | Status: DC | PRN
  Administered 2020-02-03: 500 mL

## 2020-02-03 MED ORDER — FENTANYL CITRATE (PF) 100 MCG/2ML IJ SOLN
INTRAMUSCULAR | Status: DC | PRN
  Administered 2020-02-03: 50 ug via INTRAVENOUS
  Administered 2020-02-03: 25 ug via INTRAVENOUS

## 2020-02-03 SURGICAL SUPPLY — 5 items
BAG SNAP BAND KOVER 36X36 (MISCELLANEOUS) ×3 IMPLANT
COVER DOME SNAP 22 D (MISCELLANEOUS) ×3 IMPLANT
GLIDEWIRE ANGLED SS 035X260CM (WIRE) ×3 IMPLANT
MAT PREVALON FULL STRYKER (MISCELLANEOUS) ×3 IMPLANT
TRAY PV CATH (CUSTOM PROCEDURE TRAY) ×3 IMPLANT

## 2020-02-03 NOTE — Progress Notes (Addendum)
Patient ID: [name], male   DOB: 06/08/1965, 55 y.o.   MRN: [id number]  Received request for orthopedic consult on this patient for shoulder pain. Unfortunately pt in cath lab for HD cath issues. Will check ESR and CRP but given subacute nature of pain (he apparently reached out preadmission about the pain) and at least 2 other explanations for it (I.e. hemarthrosis and gout) would be reluctant to perform arthrocentesis lest we cause septic joint. If schedule allows I will see this afternoon, otherwise in AM.    Lisette Abu, PA-C Orthopedic Surgery [phone]

## 2020-02-03 NOTE — Progress Notes (Signed)
HD#6 Subjective:  Overnight Events: Patient refused exchange of right femoral tunneled catheter by IR yesterday.   This morning, patient reports inability to sleep due to increased pain. Patient requested team to increase his IV Dilaudid.  He denied any shortness of breath or chest pain.  States he declined IR from changing his right femoral tunneled catheter because he wants the vascular team who knows him well to do it.  Objective:  Vital signs in last 24 hours: Vitals:   02/02/20 1019 02/02/20 1030 02/02/20 2022 02/03/20 0514  BP: (!) 102/52 (!) 95/31 (!) 97/52 118/65  Pulse:   66 78  Resp: '10  20 20  '$ Temp:  97.8 F (36.6 C) 97.7 F (36.5 C) 98 F (36.7 C)  TempSrc:  Oral Oral Oral  SpO2: 98%  100% 100%  Weight: 124.5 kg      Supplemental O2: Room Air SpO2: 100 %   Physical Exam:   General: Pleasant, well-appearing obese man laying in bed. No acute distress. Head: Normocephalic. Atraumatic. CV: RRR. IV/VI systolic ejection murmur. No rubs or gallops. No LE edema Pulmonary: Lungs CTAB. Normal effort. No wheezing or rales.  Abdominal: Soft, nontender, nondistended. Normal bowel sounds. Left flank sinus tract with dressing in place and no drainage. Extremities: Limited ROM of the R shoulder. Palpable dorsalis pedis and radialis pulses. Normal ROM.  Skin: Warm and dry. Ecchymosis to the lateral right chest wall. Neuro: A&Ox3. Moves all extremities. Normal sensation. No focal deficit. Psych: Normal mood and affect  Filed Weights   02/02/20 0500 02/02/20 0830 02/02/20 1019  Weight: 126.9 kg 124.5 kg 124.5 kg     Intake/Output Summary (Last 24 hours) at 02/03/2020 0658 Last data filed at 02/02/2020 1019 Gross per 24 hour  Intake --  Output 491 ml  Net -491 ml   Net IO Since Admission: -2,927.5 mL [02/03/20 0658]  Recent Labs    01/31/20 0839 02/01/20 0756  GLUCAP 85 78     Pertinent Labs: CBC Latest Ref Rng & Units 02/03/2020 02/02/2020 02/01/2020  WBC 4.0 -  10.5 K/uL 4.7 4.1 4.6  Hemoglobin 13.0 - 17.0 g/dL 7.0(L) 7.3(L) 7.7(L)  Hematocrit 39.0 - 52.0 % 23.8(L) 23.2(L) 25.0(L)  Platelets 150 - 400 K/uL 150 131(L) 139(L)    CMP Latest Ref Rng & Units 02/03/2020 02/02/2020 02/01/2020  Glucose 70 - 99 mg/dL 109(H) 98 113(H)  BUN 6 - 20 mg/dL 75(H) 74(H) 64(H)  Creatinine 0.61 - 1.24 mg/dL 13.73(H) 13.43(H) 11.64(H)  Sodium 135 - 145 mmol/L 138 137 137  Potassium 3.5 - 5.1 mmol/L 5.4(H) 5.0 4.8  Chloride 98 - 111 mmol/L 97(L) 95(L) 95(L)  CO2 22 - 32 mmol/L '27 28 28  '$ Calcium 8.9 - 10.3 mg/dL 8.7(L) 8.3(L) 8.4(L)  Total Protein 6.5 - 8.1 g/dL - - -  Total Bilirubin 0.3 - 1.2 mg/dL - - -  Alkaline Phos 38 - 126 U/L - - -  AST 15 - 41 U/L - - -  ALT 0 - 44 U/L - - -    Imaging: No results found.  Assessment/Plan:   Principal Problem:   Abscess of left thigh Active Problems:   End stage renal disease (HCC)   PAD (peripheral artery disease) (HCC)   Nephrostomy complication ([place])   Patient Summary: [name] is a 55 y.o. male with hx of ESRD on MWF HD, HTN, CAD, severe aortic stenosis, spina bifida, chronic opioid use, L knee septic arthritis who presented with epistaxis, abscess near L  thigh fistula, and malodorous discharge from chronic wound due to prior L nephrostomy tube. Now s/p removal of infected graft.  Left Thigh Abscess Fluid around old non-functional dialysis graft of left thigh POD 3. Presented with small abscessofthe left thigh as well as deeper fluid collection around nonfunctioning graft for dialysis. This graft removed by vascular with I&D of abscess on 1/28. S/p 5 days of vancomycin. --Vascular following, appreciate assistance --Vascular plan to exchange femoral tunnel catheter today --Bcx and surgical culture NGTD --Continue Dilaudid 1 mg IV q3h for breakthrough pain  ESRD on MWF dialysis Impaired dialysis access Hyperkalemia Had difficulty with right femoral HD catheter yesterday for dialysis was stopped 1  hour early. IR was consulted but patient refused exchange of his catheter by IR. Reduced ASA from 325 to 81 mg due to bleeding the night before. Plavix 75 mg was started. BMP overnight shows mild hyperkalemia to 5.4. Ordered lokelma. --Dialysis per nephro --Continue ASA 81 mg and Plavix 75 mg -Continue home PhosLo -Daily BMP -Renal diet -Avoid nephrotoxins  Spontaneous Ecchymosis Epistaxis - resolved AoC Anemia 2/2 CKD and IDA Thrombocytopenia  Ecchymosis on bilateral chest without known trauma, stable. CT right shoulder showing likely hemarthrosis, present but increased since CT coronary in October. Hx of left knee hemarthrosis. PTT, INR, vasculitis work-up, VWD labs all unremarkable. Overall, bleeding thought to be secondary to uremic thrombocytopenia and due to dual antiplatelet therapy. Platelet function assay did show platelet dysfunction. Platelet back to baseline ~150. Hgb down to 7.3  --Continue ASA and Plavix as above --Will monitor Hgb and transfuse iron --Daily CBC  Left flank sinus tract CT scan this admission and in Oct 2021 shows no fluid collection or sign of infection. Stable. No signs of infection. --No urgent surgery per urology --Daily dressing changes --Outpatient follow up with urology  R shoulder pain Likely hemarthrosis on CT. Also has lytic lesions within humeral head likely representing brown tumors 2/2 hyperparathyroidism. Patient continued to endorse R shoulder pain.  --Other consulted again today for arthrocentesis, plan to see in the AM  --F/u CRP and sed rate  Vitamin D deficiency Metabolic Bone Disease with Secondary Hyperparathryoidism On hecterol w/HD, sensipar, and binders. PTH elevated to 281 with low vitamin D --Continue vit D supplementation 50,000 u weekly --Continue HD  Chronic pain syndrome Patient endorsed increase pain overnight. Increasing IV dilaudid dose for better pain control.  -- Increased IV dilaudid to 2 mg q3h for  breakthrough pain --Continue percocet 1 tab q6h and Oxy IR 5 mg q6h   Diet: Renal diet IVF: PO intake VTE: SCDs Start: 01/28/20 0551 Code: Full PT/OT: Home Health with PT ID:  Anti-infectives (From admission, onward)   Start     Dose/Rate Route Frequency Ordered Stop   02/02/20 1200  vancomycin (VANCOCIN) IVPB 1000 mg/200 mL premix  Status:  Discontinued        1,000 mg 200 mL/hr over 60 Minutes Intravenous Every M-W-F (Hemodialysis) 02/01/20 1018 02/01/20 1024   01/30/20 1200  vancomycin (VANCOCIN) IVPB 1000 mg/200 mL premix  Status:  Discontinued        1,000 mg 200 mL/hr over 60 Minutes Intravenous Every M-W-F (Hemodialysis) 01/28/20 2202 02/01/20 1018   01/29/20 1200  vancomycin (VANCOCIN) IVPB 1000 mg/200 mL premix        1,000 mg 200 mL/hr over 60 Minutes Intravenous Every T-Th-Sa (Hemodialysis) 01/28/20 2202 01/31/20 1159   01/29/20 1127  vancomycin (VANCOCIN) 1-5 GM/200ML-% IVPB       Note to Pharmacy:  [name]   : cabinet override      01/29/20 1127 01/29/20 2329   01/28/20 1200  vancomycin (VANCOCIN) IVPB 750 mg/150 ml premix  Status:  Discontinued        750 mg 150 mL/hr over 60 Minutes Intravenous Every M-W-F (Hemodialysis) 01/28/20 1119 01/28/20 2202   01/28/20 0900  vancomycin (VANCOCIN) 2,500 mg in sodium chloride 0.9 % 500 mL IVPB        2,500 mg 250 mL/hr over 120 Minutes Intravenous  Once 01/28/20 0828 01/28/20 1221   01/28/20 0400  vancomycin (VANCOCIN) 2,500 mg in sodium chloride 0.9 % 500 mL IVPB  Status:  Discontinued        2,500 mg 250 mL/hr over 120 Minutes Intravenous  Once 01/28/20 0353 01/28/20 1530   01/28/20 0400  ceFEPIme (MAXIPIME) 2 g in sodium chloride 0.9 % 100 mL IVPB        2 g 200 mL/hr over 30 Minutes Intravenous  Once 01/28/20 0353 01/28/20 0646       Anticipated discharge to Home in 2-3 days pending HD access.  Lacinda Axon, MD 02/03/2020, 6:58 AM Pager: [facility]  Please contact the  on call pager after 5 pm and on weekends at [phone].

## 2020-02-03 NOTE — Progress Notes (Signed)
Patient ID: [name], male   DOB: October 16, 1965, 55 y.o.   MRN: [id number] S: Feels better sleeping in recliner and wants HD in recliner. O:BP (!) 118/55 (BP Location: Right Arm)   Pulse 71   Temp 98.2 F (36.8 C) (Oral)   Resp 10   Wt 124.5 kg   SpO2 100%   BMI 38.28 kg/m   Intake/Output Summary (Last 24 hours) at 02/03/2020 0953 Last data filed at 02/02/2020 1019 Gross per 24 hour  Intake -  Output 491 ml  Net -491 ml   Intake/Output: I/O last 3 completed shifts: In: 50 [IV Piggyback:50] Out: 491 [Other:491]  Intake/Output this shift:  No intake/output data recorded. Weight change: -2.4 kg Gen: NAD CVS: RRR Resp: cta Abd: +BS, soft Ext: 1+ BLE edema  Recent Labs  Lab 01/28/20 1324 01/28/20 2330 01/29/20 0625 01/30/20 0508 01/30/20 1506 01/31/20 0330 01/31/20 1157 02/01/20 0108 02/02/20 0101 02/03/20 0308  NA 137   < > 136 137 137 137  --  137 137 138  K 5.5*   < > 5.8* 5.7* 5.1 5.3*  --  4.8 5.0 5.4*  CL 93*   < > 94* 96* 97* 96*  --  95* 95* 97*  CO2 28   < > '24 28 25 25  '$ --  '28 28 27  '$ GLUCOSE 82   < > 77 81 99 78  --  113* 98 109*  BUN 74*   < > 86* 69* 68* 75*  --  64* 74* 75*  CREATININE 11.49*   < > 13.00* 11.32* 11.55* 12.36*  --  11.64* 13.43* 13.73*  ALBUMIN 3.0*  --  2.9* 2.8*  --  3.3*  --  2.8* 2.8* 3.1*  CALCIUM 8.4*   < > 8.2* 8.3* 8.4* 8.4* 8.5* 8.4* 8.3* 8.7*  PHOS 5.2*  --  6.1* 6.0*  --  6.5*  --  5.7* 6.1* 6.2*   < > = values in this interval not displayed.   Liver Function Tests: Recent Labs  Lab 02/01/20 0108 02/02/20 0101 02/03/20 0308  ALBUMIN 2.8* 2.8* 3.1*   No results for input(s): LIPASE, AMYLASE in the last 168 hours. No results for input(s): AMMONIA in the last 168 hours. CBC: Recent Labs  Lab 01/28/20 0523 01/29/20 0625 01/31/20 0330 02/01/20 0108 02/01/20 0758 02/01/20 1640 02/02/20 0101 02/03/20 0308  WBC 6.0   < > 5.0 3.5*  --  4.6 4.1 4.7  NEUTROABS 4.9  --  3.5  --   --   --  2.8  --   HGB 7.9*   < >  8.1* 6.5*   < > 7.7* 7.3* 7.0*  HCT 26.4*   < > 26.4* 22.2*   < > 25.0* 23.2* 23.8*  MCV 96.0   < > 93.6 94.9  --  92.3 93.2 94.8  PLT 109*   < > 115* 103*  --  139* 131* 150   < > = values in this interval not displayed.   Cardiac Enzymes: No results for input(s): CKTOTAL, CKMB, CKMBINDEX, TROPONINI in the last 168 hours. CBG: Recent Labs  Lab 01/29/20 1341 01/30/20 0727 01/31/20 0839 02/01/20 0756 02/03/20 0800  GLUCAP 110* 72 85 78 78    Iron Studies: No results for input(s): IRON, TIBC, TRANSFERRIN, FERRITIN in the last 72 hours. Studies/Results: No results found. Marland Kitchen aspirin EC  81 mg Oral Daily  . calcium acetate  2,668 mg Oral TID WC  . Chlorhexidine Gluconate Cloth  6  each Topical [id number]  . cholecalciferol  1,000 Units Oral Daily  . cinacalcet  30 mg Oral Q M,W,F-HD  . clopidogrel  75 mg Oral Daily  . doxercalciferol  4 mcg Intravenous Q M,W,F-HD  . famotidine  40 mg Oral Daily  .  HYDROmorphone (DILAUDID) injection  0.5 mg Intravenous Once  . levothyroxine  137 mcg Oral Q0600  . melatonin  10 mg Oral QHS  . oxyCODONE-acetaminophen  1 tablet Oral Q6H   And  . oxyCODONE  5 mg Oral Q6H  . sodium chloride flush  3 mL Intravenous Q12H  . sodium polystyrene  15 g Oral Once per day on Sun Sat  . traZODone  100 mg Oral QHS  . zolpidem  10 mg Oral QHS    BMET    Component Value Date/Time   NA 138 02/03/2020 0308   NA 135 (A) 01/17/2018 0000   K 5.4 (H) 02/03/2020 0308   CL 97 (L) 02/03/2020 0308   CO2 27 02/03/2020 0308   GLUCOSE 109 (H) 02/03/2020 0308   BUN 75 (H) 02/03/2020 0308   BUN 67 (A) 01/17/2018 0000   CREATININE 13.73 (H) 02/03/2020 0308   CALCIUM 8.7 (L) 02/03/2020 0308   CALCIUM 8.5 (L) 01/31/2020 1157   GFRNONAA 4 (L) 02/03/2020 0308   GFRAA 10 (L) 10/07/2019 0430   CBC    Component Value Date/Time   WBC 4.7 02/03/2020 0308   RBC 2.51 (L) 02/03/2020 0308   HGB 7.0 (L) 02/03/2020 0308   HCT 23.8 (L) 02/03/2020 0308   PLT 150 02/03/2020 0308    MCV 94.8 02/03/2020 0308   MCH 27.9 02/03/2020 0308   MCHC 29.4 (L) 02/03/2020 0308   RDW 17.4 (H) 02/03/2020 0308   LYMPHSABS 0.5 (L) 02/02/2020 0101   MONOABS 0.4 02/02/2020 0101   EOSABS 0.3 02/02/2020 0101   BASOSABS 0.0 02/02/2020 0101      OP HD:AF MWF 4h 6mn 350/800 118kg 2/2.25 bath Hep 3700 R thigh TDC Sensipar 30 mg q HD Hectorol 4 mcg q HD Mircera 225 mcg IV q 2 weeks- last dose 200 mcg on 1/17, due on 1/31   Assessment/ Plan: 1. SP removal L thigh infected AVG material (old AVG's): on 1/28 yesterday, by [facility]. Appreciated assistance. Cx's pend, on IV vanc 2. L flank sinus tract:from site of previous nephrostomy tube. On antibiotics as above.  3. ESRD:MWF schedule. Last HD was 1/27.HD 1/29 was not successful as catheter could not be accessed. New TDC to be placed today. 1. Wants to run in recliner so will order for tomorrow.  4. HD access: pt has been an end-stage access patient for many years now, f/b [facility] for the most part. [place]. TDC patency over the years has been assisted by regular TPA locks and, per patient, ASA and plavix have helped a lot to keep HD cath from clotting. ASA/ plavix are on hold now due to bruising/ low plts / low Hb. As soon as possible would like to resume ASA +/- Plavix, will help w/ TDC patency.  Unfortunately could not run faster than 150 bfr.  1. Consulted Dr. BTrula Sladefor right fem HD catheter exchange and hopefully will run better. 2. To have new HD cath today 5. Hypertension/volume:BP controlled. EDw raised recently. UF as tol.  6. Anemia:Hgb has been running low since last admission. ESA due 1/31. Pt requesting his full OP dose of IV heparinw/  inpatient HD (= 3700 units). 7. Metabolic bone disease:Continue sensipar, hectorol and binders. Follow phosphorus/calcium.   Donetta Potts, MD [employer] [phone]

## 2020-02-03 NOTE — Progress Notes (Addendum)
Physical Therapy Treatment Patient Details Name: [name] MRN: [id number] DOB: 05/15/1965 Today's Date: 02/03/2020    History of Present Illness 55 y.o male admitted with left abscess s/p I&D with removal dialysis graft; Hx of End Stage Renal Disease on HD, PAD, nephrostomy complication.    PT Comments    Patient awaiting procedure to his dialysis graft and hopeful to get dialysis today (he was unable to get on 1/31 due to graft failure). He is more lethargic and reports incr swelling bil LEs. Agreed to chair level exercises (see below), but did not want to work on standing or walking until after his dialysis (whether today or tomorrow). He is concerned re: incr risk of falling when feeling this poorly.    Follow Up Recommendations  Home health PT (Continue with HHPT as he states he was making good progress)     Equipment Recommendations  Other (comment) (Requests new reacher)    Recommendations for Other Services       Precautions / Restrictions Precautions Precautions: Fall Precaution Comments: R knee pain    Mobility  Bed Mobility               General bed mobility comments: up in recliner  Transfers                 General transfer comment: pt deferred due to incr bil LE edema and not feeling well after missing dialysis 1/31 due to graft failure  Ambulation/Gait                 Stairs             Wheelchair Mobility    Modified Rankin (Stroke Patients Only)       Balance                                            Cognition Arousal/Alertness: Lethargic Behavior During Therapy: Flat affect Overall Cognitive Status: Within Functional Limits for tasks assessed                                 General Comments: Feels sluggish due to lack of HD 1/31 due to failed graft      Exercises General Exercises - Lower Extremity Ankle Circles/Pumps: AROM;20 reps;Seated (10 plus 10 circles) Quad Sets:  AROM;Both;10 reps Gluteal Sets: AROM;Both;5 reps Long Arc Quad: AROM;Both;15 reps Other Exercises Other Exercises: Pt up in recliner and could not do hip abdct/adduction due to limited space on the legrest. Defers SLR or heelslides due to too much pressure on his rt thigh dialysis graft    General Comments General comments (skin integrity, edema, etc.): Pt reports walking to bathroom with no walker (one in room too small). Educated to call for nursing (he states he did and they did not come in timely manner) and he agreed he will continue for assistance. Bariatric RW placed in his room to use while hospitalized.      Pertinent Vitals/Pain Pain Assessment: Faces Faces Pain Scale: Hurts little more Pain Location: Lt groin Pain Descriptors / Indicators: Discomfort Pain Intervention(s): Limited activity within patient's tolerance;Monitored during session    Home Living                      Prior Function  PT Goals (current goals can now be found in the care plan section) Acute Rehab PT Goals Patient Stated Goal: Get well PT Goal Formulation: With patient Time For Goal Achievement: 02/15/20 Potential to Achieve Goals: Good Progress towards PT goals: Progressing toward goals    Frequency    Min 3X/week      PT Plan Current plan remains appropriate    Co-evaluation              AM-PAC PT "6 Clicks" Mobility   Outcome Measure  Help needed turning from your back to your side while in a flat bed without using bedrails?: None Help needed moving from lying on your back to sitting on the side of a flat bed without using bedrails?: None Help needed moving to and from a bed to a chair (including a wheelchair)?: None Help needed standing up from a chair using your arms (e.g., wheelchair or bedside chair)?: None Help needed to walk in hospital room?: A Little Help needed climbing 3-5 steps with a railing? : A Little 6 Click Score: 22    End of Session  Equipment Utilized During Treatment: Gait belt Activity Tolerance: Patient limited by pain (Frustrated) Patient left: in chair;with call bell/phone within reach   PT Visit Diagnosis: Other abnormalities of gait and mobility (R26.89);Muscle weakness (generalized) (M62.81);Difficulty in walking, not elsewhere classified (R26.2);Pain Pain - Right/Left: Left Pain - part of body: Leg     Time: [id number] PT Time Calculation (min) (ACUTE ONLY): 28 min  Charges:  $Therapeutic Exercise: 8-22 mins                      Arby Barrette, PT Pager [phone]    Rexanne Mano 02/03/2020, 1:31 PM

## 2020-02-03 NOTE — Op Note (Addendum)
    Patient name: [name] MRN: [id number] DOB: 09/08/1965 Sex: male  02/03/2020 Pre-operative Diagnosis: ESRD Post-operative diagnosis:  Same Surgeon:  Annamarie Major Procedure Performed:  1.  Dialysis catheter instrumentation  2.  Contrast injection into the central venous system from the existing dialysis catheter  3.  Conscious sedation 30 minutes  4.  Ultrasound-guided placement of 5 French sheath into the left basilic vein    Indications: The patient has very few options for dialysis access.  He is currently been using a right thigh catheter.  This is not functioning well.  He comes in today for evaluation  Procedure:  The patient was identified in the holding area and taken to room 8.  The patient was then placed supine on the table and prepped and draped in the usual sterile fashion.  A time out was called.  Conscious sedation was administered with the use of IV fentanyl and Versed under continuous physician and nurse monitoring.  Heart rate, blood pressure, and oxygen saturations were continuously monitored.  Total sedation time was 30 minutes ultrasound was used to evaluate the fistula.  I first had to get IV access as his peripheral IV did not work.  I evaluated his basilic vein on the left and it appeared to be adequate.  The left basilic vein was then cannulated under ultrasound guidance with a micropuncture needle.  A 018 wire was advanced without resistance and a micropuncture sheath was placed.  This was used for peripheral IV during the procedure.  Next the heparin was removed from the 2 ports on the dialysis catheter.  I then inserted a stiff Glidewire through both the red and blue port.  I did meet some resistance through the blue port at the tip.  This potentially could have been a fibrin sheath.  Once I passed the stiff Glidewire through the tip of the catheter multiple times, I was able to easily flush and aspirate through both ports.  I then performed venography through  both ports.  This shows that the tip of the dialysis catheter is in good position and a large unnamed vein and then drains essentially.  I then inserted the appropriate volumes of heparin back into the catheter      Impression:  #1  Successful instrumentation of the right thigh dialysis catheter which is now ready for use   V. Annamarie Major, M.D., [facility] [place] Office: [phone] Pager:  [phone]

## 2020-02-03 NOTE — Progress Notes (Signed)
Subjective  -   No complaints   Physical Exam:  Left groin dressed       Assessment/Plan:    Plan for manipulation of HD catheter in cath lab today  Wells Marjarie Irion 02/03/2020 12:56 PM --  Vitals:   02/03/20 0514 02/03/20 0800  BP: 118/65 (!) 118/55  Pulse: 78 71  Resp: 20 10  Temp: 98 F (36.7 C) 98.2 F (36.8 C)  SpO2: 100%    No intake or output data in the 24 hours ending 02/03/20 1256   Laboratory CBC    Component Value Date/Time   WBC 4.7 02/03/2020 0308   HGB 7.0 (L) 02/03/2020 0308   HCT 23.8 (L) 02/03/2020 0308   PLT 150 02/03/2020 0308    BMET    Component Value Date/Time   NA 138 02/03/2020 0308   NA 135 (A) 01/17/2018 0000   K 5.4 (H) 02/03/2020 0308   CL 97 (L) 02/03/2020 0308   CO2 27 02/03/2020 0308   GLUCOSE 109 (H) 02/03/2020 0308   BUN 75 (H) 02/03/2020 0308   BUN 67 (A) 01/17/2018 0000   CREATININE 13.73 (H) 02/03/2020 0308   CALCIUM 8.7 (L) 02/03/2020 0308   CALCIUM 8.5 (L) 01/31/2020 1157   GFRNONAA 4 (L) 02/03/2020 0308   GFRAA 10 (L) 10/07/2019 0430    COAG Lab Results  Component Value Date   INR 1.1 01/28/2020   INR 1.1 12/11/2019   INR 1.0 12/01/2018   No results found for: PTT  Antibiotics Anti-infectives (From admission, onward)   Start     Dose/Rate Route Frequency Ordered Stop   02/02/20 1200  vancomycin (VANCOCIN) IVPB 1000 mg/200 mL premix  Status:  Discontinued        1,000 mg 200 mL/hr over 60 Minutes Intravenous Every M-W-F (Hemodialysis) 02/01/20 1018 02/01/20 1024   01/30/20 1200  vancomycin (VANCOCIN) IVPB 1000 mg/200 mL premix  Status:  Discontinued        1,000 mg 200 mL/hr over 60 Minutes Intravenous Every M-W-F (Hemodialysis) 01/28/20 2202 02/01/20 1018   01/29/20 1200  vancomycin (VANCOCIN) IVPB 1000 mg/200 mL premix        1,000 mg 200 mL/hr over 60 Minutes Intravenous Every T-Th-Sa (Hemodialysis) 01/28/20 2202 01/31/20 1159   01/29/20 1127  vancomycin (VANCOCIN) 1-5 GM/200ML-% IVPB        Note to Pharmacy: [name]   : cabinet override      01/29/20 1127 01/29/20 2329   01/28/20 1200  vancomycin (VANCOCIN) IVPB 750 mg/150 ml premix  Status:  Discontinued        750 mg 150 mL/hr over 60 Minutes Intravenous Every M-W-F (Hemodialysis) 01/28/20 1119 01/28/20 2202   01/28/20 0900  vancomycin (VANCOCIN) 2,500 mg in sodium chloride 0.9 % 500 mL IVPB        2,500 mg 250 mL/hr over 120 Minutes Intravenous  Once 01/28/20 0828 01/28/20 1221   01/28/20 0400  vancomycin (VANCOCIN) 2,500 mg in sodium chloride 0.9 % 500 mL IVPB  Status:  Discontinued        2,500 mg 250 mL/hr over 120 Minutes Intravenous  Once 01/28/20 0353 01/28/20 1530   01/28/20 0400  ceFEPIme (MAXIPIME) 2 g in sodium chloride 0.9 % 100 mL IVPB        2 g 200 mL/hr over 30 Minutes Intravenous  Once 01/28/20 0353 01/28/20 0646       V. Leia Alf, M.D., [facility] [place] Office: [phone]  Pager:  [phone]

## 2020-02-04 ENCOUNTER — Encounter (HOSPITAL_COMMUNITY): Payer: Self-pay | Admitting: Surgery

## 2020-02-04 DIAGNOSIS — M10312 Gout due to renal impairment, left shoulder: Secondary | ICD-10-CM | POA: Diagnosis not present

## 2020-02-04 DIAGNOSIS — D691 Qualitative platelet defects: Secondary | ICD-10-CM | POA: Diagnosis not present

## 2020-02-04 DIAGNOSIS — M10311 Gout due to renal impairment, right shoulder: Secondary | ICD-10-CM

## 2020-02-04 DIAGNOSIS — T829XXD Unspecified complication of cardiac and vascular prosthetic device, implant and graft, subsequent encounter: Secondary | ICD-10-CM | POA: Diagnosis not present

## 2020-02-04 DIAGNOSIS — L02416 Cutaneous abscess of left lower limb: Secondary | ICD-10-CM | POA: Diagnosis not present

## 2020-02-04 LAB — CBC
HCT: 21.7 % — ABNORMAL LOW (ref 39.0–52.0)
HCT: 23.9 % — ABNORMAL LOW (ref 39.0–52.0)
Hemoglobin: 6.3 g/dL — CL (ref 13.0–17.0)
Hemoglobin: 7.2 g/dL — ABNORMAL LOW (ref 13.0–17.0)
MCH: 28.1 pg (ref 26.0–34.0)
MCH: 28.3 pg (ref 26.0–34.0)
MCHC: 29 g/dL — ABNORMAL LOW (ref 30.0–36.0)
MCHC: 30.1 g/dL (ref 30.0–36.0)
MCV: 96.9 fL (ref 80.0–100.0)
MCV: 94.1 fL (ref 80.0–100.0)
Platelets: 156 10*3/uL (ref 150–400)
Platelets: 162 10*3/uL (ref 150–400)
RBC: 2.24 MIL/uL — ABNORMAL LOW (ref 4.22–5.81)
RBC: 2.54 MIL/uL — ABNORMAL LOW (ref 4.22–5.81)
RDW: 17.3 % — ABNORMAL HIGH (ref 11.5–15.5)
RDW: 17.2 % — ABNORMAL HIGH (ref 11.5–15.5)
WBC: 4 10*3/uL (ref 4.0–10.5)
WBC: 4.9 10*3/uL (ref 4.0–10.5)
nRBC: 0 % (ref 0.0–0.2)
nRBC: 0 % (ref 0.0–0.2)

## 2020-02-04 LAB — RENAL FUNCTION PANEL
Albumin: 2.7 g/dL — ABNORMAL LOW (ref 3.5–5.0)
Anion gap: 13 (ref 5–15)
BUN: 65 mg/dL — ABNORMAL HIGH (ref 6–20)
CO2: 25 mmol/L (ref 22–32)
Calcium: 8.3 mg/dL — ABNORMAL LOW (ref 8.9–10.3)
Chloride: 100 mmol/L (ref 98–111)
Creatinine, Ser: 12.7 mg/dL — ABNORMAL HIGH (ref 0.61–1.24)
GFR, Estimated: 4 mL/min — ABNORMAL LOW (ref >60)
Glucose, Bld: 94 mg/dL (ref 70–99)
Phosphorus: 6.4 mg/dL — ABNORMAL HIGH (ref 2.5–4.6)
Potassium: 5.3 mmol/L — ABNORMAL HIGH (ref 3.5–5.1)
Sodium: 138 mmol/L (ref 135–145)

## 2020-02-04 LAB — AEROBIC/ANAEROBIC CULTURE W GRAM STAIN (SURGICAL/DEEP WOUND): Culture: NO GROWTH

## 2020-02-04 LAB — PREPARE RBC (CROSSMATCH)

## 2020-02-04 LAB — GLUCOSE, CAPILLARY: Glucose-Capillary: 77 mg/dL (ref 70–99)

## 2020-02-04 LAB — ACID FAST SMEAR (AFB, MYCOBACTERIA): Acid Fast Smear: NEGATIVE

## 2020-02-04 MED ORDER — SODIUM POLYSTYRENE SULFONATE 15 GM/60ML PO SUSP
30.0000 g | Freq: Every day | ORAL | Status: DC
  Filled 2020-02-04: qty 120

## 2020-02-04 MED ORDER — SODIUM POLYSTYRENE SULFONATE PO POWD
30.0000 g | Freq: Every day | ORAL | Status: DC
  Administered 2020-02-04 – 2020-02-10 (×7): 30 g via ORAL
  Filled 2020-02-04 (×10): qty 30

## 2020-02-04 MED ORDER — PENTAFLUOROPROP-TETRAFLUOROETH EX AERO: 1.0000 "application " | INHALATION_SPRAY | CUTANEOUS | Status: DC | PRN

## 2020-02-04 MED ORDER — LIDOCAINE HCL (PF) 1 % IJ SOLN: 5.0000 mL | INTRAMUSCULAR | Status: DC | PRN

## 2020-02-04 MED ORDER — HEPARIN SODIUM (PORCINE) 1000 UNIT/ML DIALYSIS
4000.0000 [IU] | Freq: Once | INTRAMUSCULAR | Status: DC
  Filled 2020-02-04: qty 4

## 2020-02-04 MED ORDER — HEPARIN SODIUM (PORCINE) 1000 UNIT/ML DIALYSIS
1000.0000 [IU] | INTRAMUSCULAR | Status: DC | PRN
  Filled 2020-02-04: qty 1

## 2020-02-04 MED ORDER — LIDOCAINE-PRILOCAINE 2.5-2.5 % EX CREA
1.0000 "application " | TOPICAL_CREAM | CUTANEOUS | Status: DC | PRN
  Filled 2020-02-04: qty 5

## 2020-02-04 MED ORDER — SODIUM CHLORIDE 0.9 % IV SOLN: 100.0000 mL | INTRAVENOUS | Status: DC | PRN

## 2020-02-04 MED ORDER — HEPARIN SODIUM (PORCINE) 1000 UNIT/ML DIALYSIS
4000.0000 [IU] | INTRAMUSCULAR | Status: DC | PRN
  Administered 2020-02-05: 4000 [IU] via INTRAVENOUS_CENTRAL
  Filled 2020-02-04 (×2): qty 4

## 2020-02-04 MED ORDER — ALTEPLASE 2 MG IJ SOLR
2.0000 mg | Freq: Once | INTRAMUSCULAR | Status: AC | PRN
  Administered 2020-02-05: 2 mg
  Filled 2020-02-04: qty 2

## 2020-02-04 MED ORDER — ALTEPLASE 2 MG IJ SOLR
2.0000 mg | Freq: Once | INTRAMUSCULAR | Status: DC | PRN
  Filled 2020-02-04: qty 2

## 2020-02-04 MED ORDER — SODIUM CHLORIDE 0.9% IV SOLUTION: Freq: Once | INTRAVENOUS | Status: AC

## 2020-02-04 MED ORDER — PREDNISONE 20 MG PO TABS
40.0000 mg | ORAL_TABLET | Freq: Every day | ORAL | Status: AC
  Administered 2020-02-05 – 2020-02-18 (×14): 40 mg via ORAL
  Filled 2020-02-04 (×14): qty 2

## 2020-02-04 MED ORDER — HEPARIN SODIUM (PORCINE) 1000 UNIT/ML DIALYSIS
20.0000 [IU]/kg | INTRAMUSCULAR | Status: DC | PRN
  Filled 2020-02-04: qty 3

## 2020-02-04 MED ORDER — LIDOCAINE HCL (PF) 1 % IJ SOLN
5.0000 mL | INTRAMUSCULAR | Status: DC | PRN
Start: 2020-02-04 — End: 2020-02-04

## 2020-02-04 MED FILL — Heparin Sodium (Porcine) Inj 1000 Unit/ML: INTRAMUSCULAR | Qty: 10 | Status: AC

## 2020-02-04 MED FILL — Lidocaine HCl Local Preservative Free (PF) Inj 1%: INTRAMUSCULAR | Qty: 30 | Status: AC

## 2020-02-04 NOTE — Progress Notes (Signed)
HD#7 Subjective:  Overnight Events: New HD catheter placed yesterday. Critical lab of 6.3 this morning. No active bleeding per RN. Due for dialysis today.  This AM, patient states his pain was much better controlled overnight with the increased dilaudid dose. He was having worsening pain because he did not get his morning dose on time. He denied any CP, SOB, abd pain or leg pain.   Objective:  Vital signs in last 24 hours: Vitals:   02/03/20 1900 02/03/20 1921 02/03/20 2026 02/04/20 0505  BP: 102/60 113/64 112/61 121/62  Pulse:  76 72 80  Resp: '11 10 20 20  '$ Temp:  99.6 F (37.6 C) 98.1 F (36.7 C) 97.8 F (36.6 C)  TempSrc:  Oral Oral Oral  SpO2:  99% 100% 100%  Weight:       Supplemental O2: Room Air SpO2: 100 %   Physical Exam:   General:  Well-appearing obese male laying in bed.  No acute distress. CV: RRR. IV/VI systolic ejection murmur. No rubs or gallops. No LE edema Pulmonary: Lungs CTA B.  No wheezing or rales.  Abdominal: Soft. NT/ND. Normal BS. Left flank sinus tract with dressing in place.  Extremities: Limited ROM of R shoulder. Normal pulses Skin: Warm and dry. Ecchymosis to the lateral right chest wall. Neuro: A&Ox3. Moves all extremities. Normal sensation.  Psych: Normal mood and affect.  Filed Weights   02/02/20 0830 02/02/20 1019 02/03/20 1629  Weight: 124.5 kg 124.5 kg 122.9 kg     Intake/Output Summary (Last 24 hours) at 02/04/2020 0653 Last data filed at 02/03/2020 1907 Gross per 24 hour  Intake -  Output 2200 ml  Net -2200 ml   Net IO Since Admission: -5,127.5 mL [02/04/20 0653]  Recent Labs    02/01/20 0756 02/03/20 0800  GLUCAP 78 78     Pertinent Labs: CBC Latest Ref Rng & Units 02/04/2020 02/03/2020 02/02/2020  WBC 4.0 - 10.5 K/uL 4.0 4.7 4.1  Hemoglobin 13.0 - 17.0 g/dL 6.3(LL) 7.0(L) 7.3(L)  Hematocrit 39.0 - 52.0 % 21.7(L) 23.8(L) 23.2(L)  Platelets 150 - 400 K/uL 156 150 131(L)    CMP Latest Ref Rng & Units 02/04/2020 02/03/2020  02/02/2020  Glucose 70 - 99 mg/dL 94 109(H) 98  BUN 6 - 20 mg/dL 65(H) 75(H) 74(H)  Creatinine 0.61 - 1.24 mg/dL 12.70(H) 13.73(H) 13.43(H)  Sodium 135 - 145 mmol/L 138 138 137  Potassium 3.5 - 5.1 mmol/L 5.3(H) 5.4(H) 5.0  Chloride 98 - 111 mmol/L 100 97(L) 95(L)  CO2 22 - 32 mmol/L '25 27 28  '$ Calcium 8.9 - 10.3 mg/dL 8.3(L) 8.7(L) 8.3(L)  Total Protein 6.5 - 8.1 g/dL - - -  Total Bilirubin 0.3 - 1.2 mg/dL - - -  Alkaline Phos 38 - 126 U/L - - -  AST 15 - 41 U/L - - -  ALT 0 - 44 U/L - - -    Imaging: PERIPHERAL VASCULAR CATHETERIZATION  Result Date: 02/03/2020 Patient name: [name] MRN: [id number] DOB: 04-07-1965 Sex: male 02/03/2020 Pre-operative Diagnosis: ESRD Post-operative diagnosis:  Same Surgeon:  Annamarie Major Procedure Performed:  1.  Dialysis catheter instrumentation  2.  Contrast injection into the central venous system from the existing dialysis catheter  3.  Conscious sedation 30 minutes  4.  Ultrasound-guided placement of 5 French sheath into the left basilic vein Indications: The patient has very few options for dialysis access.  He is currently been using a right thigh catheter.  This is not functioning well.  He comes in today for evaluation Procedure:  The patient was identified in the holding area and taken to room 8.  The patient was then placed supine on the table and prepped and draped in the usual sterile fashion.  A time out was called.  Conscious sedation was administered with the use of IV fentanyl and Versed under continuous physician and nurse monitoring.  Heart rate, blood pressure, and oxygen saturations were continuously monitored.  Total sedation time was 30 minutes ultrasound was used to evaluate the fistula.  I first had to get IV access as his peripheral IV did not work.  I evaluated his basilic vein on the left and it appeared to be adequate.  The left basilic vein was then cannulated under ultrasound guidance with a micropuncture needle.  A 018 wire was  advanced without resistance and a micropuncture sheath was placed.  This was used for peripheral IV during the procedure. Next the heparin was removed from the 2 ports on the dialysis catheter.  I then inserted a stiff Glidewire through both the red and blue port.  I did meet some resistance through the blue port at the tip.  This potentially could have been a fibrin sheath.  Once I passed the stiff Glidewire through the tip of the catheter multiple times, I was able to easily flush and aspirate through both ports.  I then performed venography through both ports.  This shows that the tip of the dialysis catheter is in good position and a large unnamed vein and then drains essentially.  I then inserted the appropriate volumes of heparin back into the catheter  Impression:  #1  Successful instrumentation of the right thigh dialysis catheter which is now ready for use V. Annamarie Major, M.D., FACS [facility] [place] Office: [phone] Pager:  [phone]   Assessment/Plan:   Principal Problem:   Abscess of left thigh Active Problems:   End stage renal disease [facility])   PAD (peripheral artery disease) ([place])   Nephrostomy complication ([place])   Complication of vascular access for dialysis   Platelet dysfunction [facility])   Patient Summary: Dashon Confer is a 55 y.o. male with hx of ESRD on MWF HD, HTN, CAD, severe aortic stenosis, spina bifida, chronic opioid use, L knee septic arthritis who presented with epistaxis, abscess near L thigh fistula, and malodorous discharge from chronic wound due to prior L nephrostomy tube. Now s/p removal of infected graft.  ESRD on MWF dialysis Impaired dialysis access, resolved Hyperkalemia Received HD today through new new femoral HD catheter. Able to complete HD but with low flow rate. Plan for another HD session tomorrow. K+ remains high at 5.3 overnight.  --Nephro following, appreciate assistance --Continue ASA 81 mg and Plavix 75  mg --Continue home PhosLo --Renal diet  Spontaneous Ecchymosis Epistaxis - resolved AoC Anemia 2/2 CKD and IDA Thrombocytopenia, resolved Ecchymosis on bilateral chest without known trauma, stable. Hgb dropped to 6.3 overnight. No signs of active GI bleed. Likely due to spontaneous hemarthrosis and anticoagulation. Low iron also likely a contributing factor. --repleting with 2u pRBCs --ESA with HD  --Continue ASA and plavix --F/u post transfusion H&H  Chronic pain syndrome Patient has hx of needing significant pain medication for pain control. Pain relatively well-controlled on current regimen.  --Continue IV dilaudid to 2 mg q3h for breakthrough pain --Continue percocet 1 tab q6h and Oxy IR 5 mg q6h   R shoulder pain Evaluated by Ortho today. Agrees likely spontaneous hemarthrosis due to  anticoagulation. Recommends against arthrocentesis due to risk of causing septic arthritis. Recommend treating for possible gout. NSAID contraindicated in this patient due to bleeding risk.  --Elevated CRP and sed rate --Start prednisone 40 mg daily for gout --Continue pain control  Left Thigh Abscess POD 4 s/p I&D. Graft removed and patient now s/p 5 days of Vancomycin. Remains afebrile with no signs of infection around wound. WBC seen on grain stain of wound culture but culture with no growth. --Bcx and surgical culture still NGTD --Continue Dilaudid 2 mg q3h for breakthrough pain.  --Continue wound care  Left flank sinus tract CT scan this admission and in Oct 2021 shows no fluid collection or sign of infection. Stable. No signs of infection. --No urgent surgery per urology --Daily dressing changes --Outpatient follow up with urology  Vitamin D deficiency Metabolic Bone Disease with Secondary Hyperparathryoidism On hecterol w/HD, sensipar, and binders. PTH elevated to 281 with low vitamin D --Continue vit D supplementation 50,000 u weekly --Continue HD  Diet: Renal diet IVF: PO  intake VTE: SCDs Start: 01/28/20 0551 Code: Full PT/OT: Home Health with PT ID:  Anti-infectives (From admission, onward)   Start     Dose/Rate Route Frequency Ordered Stop   02/02/20 1200  vancomycin (VANCOCIN) IVPB 1000 mg/200 mL premix  Status:  Discontinued        1,000 mg 200 mL/hr over 60 Minutes Intravenous Every M-W-F (Hemodialysis) 02/01/20 1018 02/01/20 1024   01/30/20 1200  vancomycin (VANCOCIN) IVPB 1000 mg/200 mL premix  Status:  Discontinued        1,000 mg 200 mL/hr over 60 Minutes Intravenous Every M-W-F (Hemodialysis) 01/28/20 2202 02/01/20 1018   01/29/20 1200  vancomycin (VANCOCIN) IVPB 1000 mg/200 mL premix        1,000 mg 200 mL/hr over 60 Minutes Intravenous Every T-Th-Sa (Hemodialysis) 01/28/20 2202 01/31/20 1159   01/29/20 1127  vancomycin (VANCOCIN) 1-5 GM/200ML-% IVPB       Note to Pharmacy: [name]   : cabinet override      01/29/20 1127 01/29/20 2329   01/28/20 1200  vancomycin (VANCOCIN) IVPB 750 mg/150 ml premix  Status:  Discontinued        750 mg 150 mL/hr over 60 Minutes Intravenous Every M-W-F (Hemodialysis) 01/28/20 1119 01/28/20 2202   01/28/20 0900  vancomycin (VANCOCIN) 2,500 mg in sodium chloride 0.9 % 500 mL IVPB        2,500 mg 250 mL/hr over 120 Minutes Intravenous  Once 01/28/20 0828 01/28/20 1221   01/28/20 0400  vancomycin (VANCOCIN) 2,500 mg in sodium chloride 0.9 % 500 mL IVPB  Status:  Discontinued        2,500 mg 250 mL/hr over 120 Minutes Intravenous  Once 01/28/20 0353 01/28/20 1530   01/28/20 0400  ceFEPIme (MAXIPIME) 2 g in sodium chloride 0.9 % 100 mL IVPB        2 g 200 mL/hr over 30 Minutes Intravenous  Once 01/28/20 0353 01/28/20 0646       Anticipated discharge to Home in 2-3 days pending HD access.  Lacinda Axon, MD 02/04/2020, 6:53 AM Pager: [facility]  Please contact the on call pager after 5 pm and on weekends at [phone].

## 2020-02-04 NOTE — Progress Notes (Signed)
Pt refused wound care at his left femoral site. Pt complained that it bleeds whenever it is changed. Informed both Mitzi Hansen MD and Candace Cruise MD via secure chat.

## 2020-02-04 NOTE — Progress Notes (Signed)
CRITICAL VALUE ALERT  Critical Value:  Hgb 6.3  Date & Time Notied:  0630  Provider Notified:yes   Orders Received/Actions taken: waiting for response

## 2020-02-04 NOTE — Progress Notes (Addendum)
Patient ID: [name], male   DOB: 1965-05-25, 55 y.o.   MRN: [id number] S: Doesn't feel well this am O:BP 121/62 (BP Location: Right Arm)   Pulse 80   Temp 97.8 F (36.6 C) (Oral)   Resp 20   Wt 122.9 kg   SpO2 100%   BMI 37.79 kg/m   Intake/Output Summary (Last 24 hours) at 02/04/2020 0950 Last data filed at 02/03/2020 1907 Gross per 24 hour  Intake --  Output 2200 ml  Net -2200 ml   Intake/Output: I/O last 3 completed shifts: In: -  Out: 2200 [Other:2200]  Intake/Output this shift:  No intake/output data recorded. Weight change: -1.6 kg Gen: NAD CVS: RRR Resp: cta Abd: benign Ext: 1+ edema bilateral lower extremities  Recent Labs  Lab 01/29/20 0625 01/30/20 0508 01/30/20 1506 01/31/20 0330 01/31/20 1157 02/01/20 0108 02/02/20 0101 02/03/20 0308 02/04/20 0548  NA 136 137 137 137  --  137 137 138 138  K 5.8* 5.7* 5.1 5.3*  --  4.8 5.0 5.4* 5.3*  CL 94* 96* 97* 96*  --  95* 95* 97* 100  CO2 '24 28 25 25  '$ --  '28 28 27 25  '$ GLUCOSE 77 81 99 78  --  113* 98 109* 94  BUN 86* 69* 68* 75*  --  64* 74* 75* 65*  CREATININE 13.00* 11.32* 11.55* 12.36*  --  11.64* 13.43* 13.73* 12.70*  ALBUMIN 2.9* 2.8*  --  3.3*  --  2.8* 2.8* 3.1* 2.7*  CALCIUM 8.2* 8.3* 8.4* 8.4* 8.5* 8.4* 8.3* 8.7* 8.3*  PHOS 6.1* 6.0*  --  6.5*  --  5.7* 6.1* 6.2* 6.4*   Liver Function Tests: Recent Labs  Lab 02/02/20 0101 02/03/20 0308 02/04/20 0548  ALBUMIN 2.8* 3.1* 2.7*   No results for input(s): LIPASE, AMYLASE in the last 168 hours. No results for input(s): AMMONIA in the last 168 hours. CBC: Recent Labs  Lab 01/31/20 0330 02/01/20 0108 02/01/20 0758 02/01/20 1640 02/02/20 0101 02/03/20 0308 02/04/20 0548  WBC 5.0 3.5*  --  4.6 4.1 4.7 4.0  NEUTROABS 3.5  --   --   --  2.8  --   --   HGB 8.1* 6.5*   < > 7.7* 7.3* 7.0* 6.3*  HCT 26.4* 22.2*   < > 25.0* 23.2* 23.8* 21.7*  MCV 93.6 94.9  --  92.3 93.2 94.8 96.9  PLT 115* 103*  --  139* 131* 150 156   < > = values in this  interval not displayed.   Cardiac Enzymes: No results for input(s): CKTOTAL, CKMB, CKMBINDEX, TROPONINI in the last 168 hours. CBG: Recent Labs  Lab 01/30/20 0727 01/31/20 0839 02/01/20 0756 02/03/20 0800 02/04/20 0845  GLUCAP 72 85 78 78 77    Iron Studies: No results for input(s): IRON, TIBC, TRANSFERRIN, FERRITIN in the last 72 hours. Studies/Results: PERIPHERAL VASCULAR CATHETERIZATION  Result Date: 02/03/2020 Patient name: [name] MRN: [id number] DOB: October 14, 1965 Sex: male 02/03/2020 Pre-operative Diagnosis: ESRD Post-operative diagnosis:  Same Surgeon:  Annamarie Major Procedure Performed:  1.  Dialysis catheter instrumentation  2.  Contrast injection into the central venous system from the existing dialysis catheter  3.  Conscious sedation 30 minutes  4.  Ultrasound-guided placement of 5 French sheath into the left basilic vein Indications: The patient has very few options for dialysis access.  He is currently been using a right thigh catheter.  This is not functioning well.  He comes in today for evaluation Procedure:  The patient was identified in the holding area and taken to room 8.  The patient was then placed supine on the table and prepped and draped in the usual sterile fashion.  A time out was called.  Conscious sedation was administered with the use of IV fentanyl and Versed under continuous physician and nurse monitoring.  Heart rate, blood pressure, and oxygen saturations were continuously monitored.  Total sedation time was 30 minutes ultrasound was used to evaluate the fistula.  I first had to get IV access as his peripheral IV did not work.  I evaluated his basilic vein on the left and it appeared to be adequate.  The left basilic vein was then cannulated under ultrasound guidance with a micropuncture needle.  A 018 wire was advanced without resistance and a micropuncture sheath was placed.  This was used for peripheral IV during the procedure. Next the heparin was removed  from the 2 ports on the dialysis catheter.  I then inserted a stiff Glidewire through both the red and blue port.  I did meet some resistance through the blue port at the tip.  This potentially could have been a fibrin sheath.  Once I passed the stiff Glidewire through the tip of the catheter multiple times, I was able to easily flush and aspirate through both ports.  I then performed venography through both ports.  This shows that the tip of the dialysis catheter is in good position and a large unnamed vein and then drains essentially.  I then inserted the appropriate volumes of heparin back into the catheter  Impression:  #1  Successful instrumentation of the right thigh dialysis catheter which is now ready for use V. Annamarie Major, M.D., [facility] [place] Office: [phone] Pager:  [phone]  . sodium chloride   Intravenous Once  . aspirin EC  81 mg Oral Daily  . calcium acetate  2,668 mg Oral TID WC  . Chlorhexidine Gluconate Cloth  6 each Topical Q0600  . cholecalciferol  1,000 Units Oral Daily  . cinacalcet  30 mg Oral Q M,W,F-HD  . clopidogrel  75 mg Oral Daily  . doxercalciferol  4 mcg Intravenous Q M,W,F-HD  . famotidine  40 mg Oral Daily  .  HYDROmorphone (DILAUDID) injection  0.5 mg Intravenous Once  . levothyroxine  137 mcg Oral Q0600  . melatonin  10 mg Oral QHS  . oxyCODONE-acetaminophen  1 tablet Oral Q6H   And  . oxyCODONE  5 mg Oral Q6H  . sodium chloride flush  3 mL Intravenous Q12H  . sodium polystyrene  15 g Oral Once per day on Sun Sat  . traZODone  100 mg Oral QHS  . zolpidem  10 mg Oral QHS    BMET    Component Value Date/Time   NA 138 02/04/2020 0548   NA 135 (A) 01/17/2018 0000   K 5.3 (H) 02/04/2020 0548   CL 100 02/04/2020 0548   CO2 25 02/04/2020 0548   GLUCOSE 94 02/04/2020 0548   BUN 65 (H) 02/04/2020 0548   BUN 67 (A) 01/17/2018 0000   CREATININE 12.70 (H) 02/04/2020 0548   CALCIUM 8.3 (L) 02/04/2020 0548   CALCIUM  8.5 (L) 01/31/2020 1157   GFRNONAA 4 (L) 02/04/2020 0548   GFRAA 10 (L) 10/07/2019 0430   CBC    Component Value Date/Time   WBC 4.0 02/04/2020 0548   RBC 2.24 (L) 02/04/2020 0548   HGB 6.3 (LL) 02/04/2020 0548  HCT 21.7 (L) 02/04/2020 0548   PLT 156 02/04/2020 0548   MCV 96.9 02/04/2020 0548   MCH 28.1 02/04/2020 0548   MCHC 29.0 (L) 02/04/2020 0548   RDW 17.3 (H) 02/04/2020 0548   LYMPHSABS 0.5 (L) 02/02/2020 0101   MONOABS 0.4 02/02/2020 0101   EOSABS 0.3 02/02/2020 0101   BASOSABS 0.0 02/02/2020 0101     OP HD:AF MWF 4h 35mn 350/800 118kg 2/2.25 bath Hep 3700 R thigh TDC Sensipar 30 mg q HD Hectorol 4 mcg q HD Mircera 225 mcg IV q 2 weeks- last dose 200 mcg on 1/17, due on 1/31   Assessment/ Plan: 1. SP removal L thigh infected AVG material (old AVG's): on 1/28 yesterday, by [facility]. Appreciated assistance. Cx's pend, on IV vanc 2. L flank sinus tract:from site of previous nephrostomy tube. On antibiotics as above.  3. ESRD:MWF schedule. Last HD was 1/27.HD 1/29 was not successful as catheter could not be accessed. 1. Wants to run in recliner so will order for tomorrow.  4. HD access: pt has been an end-stage access patient for many years now, f/b [facility] for the most part. [place]. TDC patency over the years has been assisted by regular TPA locks and, per patient, ASA and plavix have helped a lot to keep HD cath from clotting. ASA/ plavix are on hold now due to bruising/ low plts / low Hb. As soon as possible would like to resume ASA +/- Plavix, will help w/ TDC patency.  Unfortunately could not run faster than 150 bfr.  1. Consulted Dr. BTrula Sladefor right fem HD catheter manipulation which was performed 02/03/20.  HD able to be completed but max BFR was only 175. 2. Activase placed and will attempt another session tomorrow and see how it performs. 5. Hypertension/volume:BP controlled. EDw raised recently. UF  as tol.  6. Anemia:Hgb has been running low since last admission. ESA due 1/31. Pt requesting his full OP dose of IV heparinw/ inpatient HD (= 3700 units). 7. Metabolic bone disease:Continue sensipar, hectorol and binders. Follow phos/calcium 8. Hyperkalemia- pt normally takes kayexalate at home and will resume to help with hyperkalemia.   JDonetta Potts MD [employer]([phone]

## 2020-02-04 NOTE — Progress Notes (Signed)
Physical Therapy Treatment Patient Details Name: [name] MRN: [id number] DOB: October 18, 1965 Today's Date: 02/04/2020    History of Present Illness 55 y.o male admitted with left abscess s/p I&D with removal dialysis graft; Hx of End Stage Renal Disease on HD, PAD, nephrostomy complication.    PT Comments    Continuing work on functional mobility and activity tolerance;  [name] reports not feeling well today, and initially did not want to get up, but then agreed to getting OOB when lunch came; Overall just needing minguard to supervision for safety with transfers; I'm concerned that he will lose strength and endurance, having not walked much this hospital stay; Of course, reliable HD access is the more pressing medical issue, and Zoe became tearful taking about it; [name]  Follow Up Recommendations  Home health PT (Continue with HHPT as he states he was making good progress)     Equipment Recommendations  Other (comment) (Requests new reacher)    Recommendations for Other Services  pastoral care     Precautions / Restrictions Precautions Precautions: Fall Precaution Comments: R knee pain (Though did not mention R knee pain during brief session 2/2) Restrictions RLE Weight Bearing: Weight bearing as tolerated LLE Weight Bearing: Weight bearing as tolerated    Mobility  Bed Mobility Overal bed mobility: Independent;Needs Assistance       Supine to sit: Supervision     General bed mobility comments: used bed rails; reports his HD catheter doesn't bother him too much  Transfers Overall transfer level: Needs assistance Equipment used: None Transfers: Stand Pivot Transfers   Stand pivot transfers: Min guard (without physical contact)       General transfer comment: Noted pt's feet were adducted and touching each other, creating a very small base of support initiatlly for rise from bed; Dependent on UEs during pivot bed to  recliner  Ambulation/Gait                 Stairs             Wheelchair Mobility    Modified Rankin (Stroke Patients Only)       Balance     Sitting balance-Leahy Scale: Good       Standing balance-Leahy Scale: Poor Standing balance comment: Definite need for UE support during pivot transfer                            Cognition Arousal/Alertness: Awake/alert Behavior During Therapy: WFL for tasks assessed/performed;Anxious (Tearful, and requesting prayer) Overall Cognitive Status: Within Functional Limits for tasks assessed                                 General Comments: Voiced that he is scared that he will die soon; Says at times he prays Jesus will take him; then made eye contact and said "Idon't want to die"      Exercises      General Comments General comments (skin integrity, edema, etc.): Emotional and tearful during session; initially saying he would stay in the bed, but then agreed to get OOB to recliner to eat lunch      Pertinent Vitals/Pain Pain Assessment: Faces Faces Pain Scale: Hurts a little bit Pain Location: Lt groin Pain Descriptors / Indicators: Discomfort Pain Intervention(s): Monitored during session    Home Living  Prior Function            PT Goals (current goals can now be found in the care plan section) Acute Rehab PT Goals Patient Stated Goal: Get well PT Goal Formulation: With patient Time For Goal Achievement: 02/15/20 Potential to Achieve Goals: Good Progress towards PT goals: Progressing toward goals    Frequency    Min 3X/week      PT Plan Current plan remains appropriate    Co-evaluation              AM-PAC PT "6 Clicks" Mobility   Outcome Measure  Help needed turning from your back to your side while in a flat bed without using bedrails?: None Help needed moving from lying on your back to sitting on the side of a flat bed without  using bedrails?: None Help needed moving to and from a bed to a chair (including a wheelchair)?: A Little Help needed standing up from a chair using your arms (e.g., wheelchair or bedside chair)?: None Help needed to walk in hospital room?: A Little Help needed climbing 3-5 steps with a railing? : A Lot 6 Click Score: 20    End of Session   Activity Tolerance: Patient tolerated treatment well Patient left: in chair;with call bell/phone within reach Nurse Communication: Mobility status PT Visit Diagnosis: Other abnormalities of gait and mobility (R26.89);Muscle weakness (generalized) (M62.81);Difficulty in walking, not elsewhere classified (R26.2);Pain Pain - Right/Left: Left Pain - part of body: Leg     Time: [id number] PT Time Calculation (min) (ACUTE ONLY): 16 min  Charges:  $Therapeutic Activity: 8-22 mins                     Roney Marion, PT  Acute Rehabilitation Services Pager [phone] Office [phone]    [place] 02/04/2020, 4:26 PM

## 2020-02-04 NOTE — Progress Notes (Signed)
  Progress Note    02/04/2020 8:13 AM 1 Day Post-Op  Subjective:  States he is not feeling well this morning  Vitals:   02/03/20 2026 02/04/20 0505  BP: 112/61 121/62  Pulse: 72 80  Resp: 20 20  Temp: 98.1 F (36.7 C) 97.8 F (36.6 C)  SpO2: 100% 100%   Physical Exam: Cardiac:  regular Lungs:  Non labored Incisions:  Dressings to left thigh clean, dry intact. Right Femoral TDC dressings clean. No surrounding swelling or hematoma. No bleeding or drainage Neurologic: alert and oriented  CBC    Component Value Date/Time   WBC 4.0 02/04/2020 0548   RBC 2.24 (L) 02/04/2020 0548   HGB 6.3 (LL) 02/04/2020 0548   HCT 21.7 (L) 02/04/2020 0548   PLT 156 02/04/2020 0548   MCV 96.9 02/04/2020 0548   MCH 28.1 02/04/2020 0548   MCHC 29.0 (L) 02/04/2020 0548   RDW 17.3 (H) 02/04/2020 0548   LYMPHSABS 0.5 (L) 02/02/2020 0101   MONOABS 0.4 02/02/2020 0101   EOSABS 0.3 02/02/2020 0101   BASOSABS 0.0 02/02/2020 0101    BMET    Component Value Date/Time   NA 138 02/04/2020 0548   NA 135 (A) 01/17/2018 0000   K 5.3 (H) 02/04/2020 0548   CL 100 02/04/2020 0548   CO2 25 02/04/2020 0548   GLUCOSE 94 02/04/2020 0548   BUN 65 (H) 02/04/2020 0548   BUN 67 (A) 01/17/2018 0000   CREATININE 12.70 (H) 02/04/2020 0548   CALCIUM 8.3 (L) 02/04/2020 0548   CALCIUM 8.5 (L) 01/31/2020 1157   GFRNONAA 4 (L) 02/04/2020 0548   GFRAA 10 (L) 10/07/2019 0430    INR    Component Value Date/Time   INR 1.1 01/28/2020 0434     Intake/Output Summary (Last 24 hours) at 02/04/2020 0813 Last data filed at 02/03/2020 1907 Gross per 24 hour  Intake --  Output 2200 ml  Net -2200 ml     Assessment/Plan:  55 y.o. male is s/p Dialysis catheter instrumentation of right thigh dialysis cathetr 1 Day Post-Op. Excision of left Thigh AVG 5 days post op. Post intervention says he had HD last evening after procedure and it worked fairly well. He is not complaining of any pain at catheter site. Dressings are  clean dry and intact.Hgb 6.3 this morning. Transfusion per primary team. Left thigh wound continue wet to dry dressing changes daily    Karoline Caldwell, [facility] [phone] 02/04/2020 8:13 AM

## 2020-02-04 NOTE — Consult Note (Signed)
Reason for Consult:Right shoulder pain Referring Physician: C Guilloud Time called: [id number] Time at bedside: 0855 (2/2 pt in procedure)   [name] is an 55 y.o. male.  HPI: Griselda is well known to the orthopedic service. 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 Since hospital discharge he developed right shoulder pain that was constant and, at times, severe. During this admission a CT scan of the shoulder was obtained and showed a likely hemearthrosis and possibly e/o gouty changes and orthopedic surgery was consulted. He denies any fall or trauma to the shoulder. He denies pain like this before and is RHD.  Past Medical History:  Diagnosis Date  . Anemia   . Anxiety   . Clotted dialysis access ([place]) 01/06/2020  . Constipation   . End stage renal failure on dialysis [facility])    [place]; MWF, on HD since 1997, exhausted upper ext access, and possibly LE access; cath dependent in R groin as of 06/2018  . GERD (gastroesophageal reflux disease)   . Grand mal seizure ([place]) X 4   last 1998 (02/29/2016)  . History of blood transfusion 2000s   "I was having blood loss; never found out from where"  . History of kidney stones   . Hypertension    hx of - has not taken bp meds in over 2 years  since dialysis  . Hypothyroidism   . Insomnia   . Migraine    "due to my BP in my late 1990s; none since" (02/29/2016)  . Nonhealing surgical wound    of the left arteriovenous graft  . Severe aortic stenosis    no problems with per pt. - nephrologist and Dr. Coletta Memos  . Spina bifida [facility])     Past Surgical History:  Procedure Laterality Date  . A/V FISTULAGRAM Right 02/03/2020   Procedure: A/V Fistulagram;  Surgeon: Serafina Mitchell, MD;  Location: [place] [facility];  Service: Cardiovascular;  Laterality: Right;  . AMPUTATION Left 12/11/2017   Procedure: Left Great Toe Amputation;  Surgeon: [name], MD;  Location: [place];  Service:  Orthopedics;  Laterality: Left;  . APPENDECTOMY    . APPLICATION OF WOUND VAC Left 10/04/2019   Procedure: APPLICATION OF WOUND VAC;  Surgeon: Serafina Mitchell, MD;  Location: [place];  Service: Vascular;  Laterality: Left;  . ARTERIOVENOUS GRAFT PLACEMENT Left 10/16/2012   left femoral goretex graft         Dr Donnetta Hutching  . AV FISTULA PLACEMENT Left 06/27/2012   Procedure: EXPLORATORY LEFT THY-GRAFT PSEUDO-ANEURYSM;  Surgeon: Conrad [place], MD;  Location: [place];  Service: Vascular;  Laterality: Left;  Revision of left Arteriovenus gortex graft in thigh.  Bertram Savin REMOVAL Left 10/04/2019   Procedure: REMOVAL OF ARTERIOVENOUS GORETEX GRAFT ([place]);  Surgeon: Serafina Mitchell, MD;  Location: [facility] OR;  Service: Vascular;  Laterality: Left;  . COLONOSCOPY    . DRESSING CHANGE UNDER ANESTHESIA Left 10/07/2019   Procedure: DRESSING CHANGE UNDER ANESTHESIA;  Surgeon: Meredith Pel, MD;  Location: [place];  Service: Orthopedics;  Laterality: Left;  . FLEXIBLE SIGMOIDOSCOPY N/A 12/03/2018   Procedure: FLEXIBLE SIGMOIDOSCOPY;  Surgeon: Yetta Flock, MD;  Location: [place];  Service: Gastroenterology;  Laterality: N/A;  . I & D EXTREMITY Left 02/29/2016   Procedure: DEBRIDEMENT LEFT THIGH WOUND;  Surgeon: Waynetta Sandy, MD;  Location: [place];  Service: Vascular;  Laterality: Left;  . I &  D EXTREMITY Left 10/04/2019   Procedure: IRRIGATION AND DEBRIDEMENT LEFT THIGH;  Surgeon: Serafina Mitchell, MD;  Location: [facility];  Service: Vascular;  Laterality: Left;  . I & D EXTREMITY Right 12/09/2019   Procedure: IRRIGATION AND DEBRIDEMENT EXTREMITY;  Surgeon: Altamese Summerville, MD;  Location: [place];  Service: Orthopedics;  Laterality: Right;  . ILEOSTOMY  1970s  . INCISION AND DRAINAGE Left 10/16/2012   Procedure: INCISION AND Debridement left thigh graft;  Surgeon: Rosetta Posner, MD;  Location: [place];  Service: Vascular;  Laterality: Left;  . INSERTION OF DIALYSIS CATHETER    . INSERTION OF DIALYSIS CATHETER   01/14/2016   Procedure: INSERTION OF DIALYSIS CATHETER;  Surgeon: Waynetta Sandy, MD;  Location: [place];  Service: Vascular;;  . INSERTION OF DIALYSIS CATHETER Right 08/07/2017   Procedure: INSERTION OF DIALYSIS CATHETER;  Surgeon: Waynetta Sandy, MD;  Location: [place];  Service: Vascular;  Laterality: Right;  . INSERTION OF DIALYSIS CATHETER Right 12/21/2017   Procedure: RIGHT FEMORAL DIALYSIS CATHETER EXCHANGE;  Surgeon: Marty Heck, MD;  Location: [place];  Service: Vascular;  Laterality: Right;  . INSERTION OF ILIAC STENT Left 01/14/2016   Procedure: INSERTION OF ILIAC STENT;  Surgeon: Waynetta Sandy, MD;  Location: [place];  Service: Vascular;  Laterality: Left;  . INTRAOPERATIVE ARTERIOGRAM Left 01/14/2016   Procedure: INTRA OPERATIVE ARTERIOGRAM;  Surgeon: Waynetta Sandy, MD;  Location: [place];  Service: Vascular;  Laterality: Left;  . IR NEPHROSTOMY EXCHANGE LEFT  03/21/2019  . IR NEPHROSTOMY PLACEMENT LEFT  11/28/2018  . IRRIGATION AND DEBRIDEMENT EXTREMITY (Right   12/09/2019  . KNEE ARTHROSCOPY Left 10/07/2019   Procedure: ARTHROSCOPY KNEE w/ STIMULATING  BEAD PLACMENT;  Surgeon: Meredith Pel, MD;  Location: [place];  Service: Orthopedics;  Laterality: Left;  . LEFT HEART CATH AND CORONARY ANGIOGRAPHY N/A 10/17/2019   Procedure: LEFT HEART CATH AND CORONARY ANGIOGRAPHY;  Surgeon: Jettie Booze, MD;  Location: [place] [facility];  Service: Cardiovascular;  Laterality: N/A;  . LITHOTRIPSY  x 3   "& a laser treatment" (02/29/2016)  . NEPHROLITHOTOMY Left 03/26/2019   Procedure: NEPHROLITHOTOMY PERCUTANEOUS;  Surgeon: Alexis Frock, MD;  Location: [facility];  Service: Urology;  Laterality: Left;  2 HRS  . NEPHROSTOMY Bilateral 1968  . PATELLAR TENDON REPAIR Right 1990s   "big incision"  . PERIPHERAL VASCULAR CATHETERIZATION  01/14/2016   Procedure: A/V SHUNTOGRAM;  Surgeon: Waynetta Sandy, MD;  Location: [facility] OR;  Service: Vascular;;   . REMOVAL OF GRAFT Left 01/30/2020   Procedure: REMOVAL OF INFECTED AV GRAFT LEFT THIGH;  Surgeon: Serafina Mitchell, MD;  Location: [place];  Service: Vascular;  Laterality: Left;  . REVISION OF ARTERIOVENOUS GORETEX GRAFT Left 10/16/2012   Procedure: REVISION OF LEFT FEMORAL LOOP ARTERIOVENOUS GORETEX GRAFT;  Surgeon: Rosetta Posner, MD;  Location: [place];  Service: Vascular;  Laterality: Left;  . REVISION OF ARTERIOVENOUS GORETEX GRAFT Left 02/11/2015   Procedure: EXCISION OF SMALL SEGMENT OF EXPOSED LEFT THIGH NON FUNCTIONING  ARTERIOVENOUS GORETEX GRAFT;  Surgeon: Mal Misty, MD;  Location: [place];  Service: Vascular;  Laterality: Left;  . REVISION OF ARTERIOVENOUS GORETEX GRAFT Left 01/14/2016   Procedure: REVISION OF ARTERIOVENOUS GORETEX GRAFT;  Surgeon: Waynetta Sandy, MD;  Location: [place];  Service: Vascular;  Laterality: Left;  . REVISION OF ARTERIOVENOUS GORETEX GRAFT Left 02/29/2016   thigh/pt report  . REVISION OF ARTERIOVENOUS GORETEX GRAFT Left 02/29/2016  Procedure: POSSIBLE REVISION OF LEFT THIGH ARTERIOVENOUS GORETEX GRAFT;  Surgeon: Waynetta Sandy, MD;  Location: [place];  Service: Vascular;  Laterality: Left;  . TEE WITHOUT CARDIOVERSION N/A 12/14/2017   Procedure: TRANSESOPHAGEAL ECHOCARDIOGRAM (TEE);  Surgeon: Acie Fredrickson, Wonda Cheng, MD;  Location: [place];  Service: Cardiovascular;  Laterality: N/A;  . THROMBECTOMY AND REVISION OF ARTERIOVENTOUS (AV) GORETEX  GRAFT Left 06/12/2018   Procedure: Removal of infected left thigh graft;  Surgeon: Rosetta Posner, MD;  Location: [place];  Service: Vascular;  Laterality: Left;  . THROMBECTOMY W/ EMBOLECTOMY Left 01/14/2016   Procedure: THROMBECTOMY ARTERIOVENOUS GORE-TEX Left thigh GRAFT;  Surgeon: Waynetta Sandy, MD;  Location: [place];  Service: Vascular;  Laterality: Left;  . TONGUE SURGERY  ~ 1990   tongue-tie release   . ULTRASOUND GUIDANCE FOR VASCULAR ACCESS Right 08/07/2017   Procedure: ULTRASOUND GUIDANCE FOR  VASCULAR CANNULATION RIGHT FEMORAL VEIN AND LEFT AV FEMORAL GRAFT;  Surgeon: Waynetta Sandy, MD;  Location: [place];  Service: Vascular;  Laterality: Right;  . UPPER EXTREMITY VENOGRAPHY Bilateral 08/09/2017   Procedure: UPPER EXTREMITY VENOGRAPHY;  Surgeon: Serafina Mitchell, MD;  Location: [place] [facility];  Service: Cardiovascular;  Laterality: Bilateral;  . VENOGRAM N/A 09/20/2017   Procedure: RIGHT COMMON FEMORAL ARTERY EXPLORATION.  CANNULATION RIGHT COMMON FEMORAL VEIN. VENOGRAM CENTRAL ULTRA SOUND GUIDED RIGHT FEMORAL VEIN TIMES TWO.;  Surgeon: Waynetta Sandy, MD;  Location: [place];  Service: Vascular;  Laterality: N/A;  . VENOGRAM Right 10/04/2019   Procedure: VENOGRAM;  Surgeon: Serafina Mitchell, MD;  Location: [place];  Service: Vascular;  Laterality: Right;  . WOUND DEBRIDEMENT Left 02/29/2016   thigh    Family History  Problem Relation Age of Onset  . Diabetes Mother   . Hypertension Mother   . Heart disease Mother        before age 45  . Diabetes Father   . Heart attack Father        X's 3  . Diabetes Sister   . Bipolar disorder Sister     Social History:  reports that he has never smoked. He has never used smokeless tobacco. He reports previous alcohol use. He reports previous drug use. Drug: Marijuana.  Allergies:  Allergies  Allergen Reactions  . Hydrocodone Other (See Comments)    Caused involuntary movement and twitching. CANNOT TAKE DUE TO MUSCLE SPASMS AND MUSCLE TREMORS  . Furadantin [Nitrofurantoin] Other (See Comments)    UNSPECIFIED REACTION   . Mandelamine [Methenamine] Other (See Comments)    UNSPECIFIED REACTION   . Noroxin [Norfloxacin] Other (See Comments)    UNSPECIFIED REACTION   . Colace [Docusate] Diarrhea and Nausea And Vomiting  . Elemental Sulfur Cough    Childhood reaction - pt could not confirm that it was a cough  . Sulfa Antibiotics Other (See Comments) and Cough    Childhood reaction - pt could not confirm that it was a  cough    Medications: I have reviewed the patient's current medications.  Results for orders placed or performed during the hospital encounter of 01/27/20 (from the past 48 hour(s))  CBC     Status: Abnormal   Collection Time: 02/03/20  3:08 AM  Result Value Ref Range   WBC 4.7 4.0 - 10.5 K/uL   RBC 2.51 (L) 4.22 - 5.81 MIL/uL   Hemoglobin 7.0 (L) 13.0 - 17.0 g/dL   HCT 23.8 (L) 39.0 - 52.0 %   MCV 94.8 80.0 - 100.0 fL  MCH 27.9 26.0 - 34.0 pg   MCHC 29.4 (L) 30.0 - 36.0 g/dL   RDW 17.4 (H) 11.5 - 15.5 %   Platelets 150 150 - 400 K/uL   nRBC 0.0 0.0 - 0.2 %    Comment: Performed at [facility] [address] [place], [place] [address]  Renal function panel     Status: Abnormal   Collection Time: 02/03/20  3:08 AM  Result Value Ref Range   Sodium 138 135 - 145 mmol/L   Potassium 5.4 (H) 3.5 - 5.1 mmol/L   Chloride 97 (L) 98 - 111 mmol/L   CO2 27 22 - 32 mmol/L   Glucose, Bld 109 (H) 70 - 99 mg/dL    Comment: Glucose reference range applies only to samples taken after fasting for at least 8 hours.   BUN 75 (H) 6 - 20 mg/dL   Creatinine, Ser 13.73 (H) 0.61 - 1.24 mg/dL   Calcium 8.7 (L) 8.9 - 10.3 mg/dL   Phosphorus 6.2 (H) 2.5 - 4.6 mg/dL   Albumin 3.1 (L) 3.5 - 5.0 g/dL   GFR, Estimated 4 (L) >60 mL/min    Comment: (NOTE) Calculated using the CKD-EPI Creatinine Equation (2021)    Anion gap 14 5 - 15    Comment: Performed at [facility] [address], [place], [place] [address]  Glucose, capillary     Status: None   Collection Time: 02/03/20  8:00 AM  Result Value Ref Range   Glucose-Capillary 78 70 - 99 mg/dL    Comment: Glucose reference range applies only to samples taken after fasting for at least 8 hours.  Sedimentation rate     Status: Abnormal   Collection Time: 02/03/20  8:16 PM  Result Value Ref Range   Sed Rate 99 (H) 0 - 16 mm/hr    Comment: Performed at [facility] [address] [place],  [address]  C-reactive protein      Status: Abnormal   Collection Time: 02/03/20  8:16 PM  Result Value Ref Range   CRP 11.0 (H) <1.0 mg/dL    Comment: Performed at [facility] [address], [place] [address]  CBC     Status: Abnormal   Collection Time: 02/04/20  5:48 AM  Result Value Ref Range   WBC 4.0 4.0 - 10.5 K/uL   RBC 2.24 (L) 4.22 - 5.81 MIL/uL   Hemoglobin 6.3 (LL) 13.0 - 17.0 g/dL    Comment: REPEATED TO VERIFY THIS CRITICAL RESULT HAS VERIFIED AND BEEN CALLED TO B. DOWELL, RN BY JULIE MACEDA DEL ANGEL ON 02 02 2022 AT 0629, AND HAS BEEN READ BACK.     HCT 21.7 (L) 39.0 - 52.0 %   MCV 96.9 80.0 - 100.0 fL   MCH 28.1 26.0 - 34.0 pg   MCHC 29.0 (L) 30.0 - 36.0 g/dL   RDW 17.3 (H) 11.5 - 15.5 %   Platelets 156 150 - 400 K/uL   nRBC 0.0 0.0 - 0.2 %    Comment: Performed at [facility] [address] [place],  [address]  Renal function panel     Status: Abnormal   Collection Time: 02/04/20  5:48 AM  Result Value Ref Range   Sodium 138 135 - 145 mmol/L   Potassium 5.3 (H) 3.5 - 5.1 mmol/L   Chloride 100 98 - 111 mmol/L   CO2 25 22 - 32 mmol/L   Glucose, Bld  94 70 - 99 mg/dL    Comment: Glucose reference range applies only to samples taken after fasting for at least 8 hours.   BUN 65 (H) 6 - 20 mg/dL   Creatinine, Ser 12.70 (H) 0.61 - 1.24 mg/dL   Calcium 8.3 (L) 8.9 - 10.3 mg/dL   Phosphorus 6.4 (H) 2.5 - 4.6 mg/dL   Albumin 2.7 (L) 3.5 - 5.0 g/dL   GFR, Estimated 4 (L) >60 mL/min    Comment: (NOTE) Calculated using the CKD-EPI Creatinine Equation (2021)    Anion gap 13 5 - 15    Comment: Performed at [facility] [address], [place], [place] [address]  Prepare RBC (crossmatch)     Status: None   Collection Time: 02/04/20  7:10 AM  Result Value Ref Range   Order Confirmation      ORDER PROCESSED BY BLOOD BANK Performed at [facility], [place] [address], [place], [place] [address]   Type and screen [place]     Status: None  (Preliminary result)   Collection Time: 02/04/20  7:10 AM  Result Value Ref Range   ABO/RH(D) A POS    Antibody Screen NEG    Sample Expiration      02/07/2020,2359 Performed at [facility], [place] [address] [place], [place] [address]    Unit Number [id number]    Blood Component Type RED CELLS,LR    Unit division 00    Status of Unit ALLOCATED    Transfusion Status OK TO TRANSFUSE    Crossmatch Result Compatible    Unit Number [id number]    Blood Component Type RED CELLS,LR    Unit division 00    Status of Unit ALLOCATED    Transfusion Status OK TO TRANSFUSE    Crossmatch Result Compatible   Glucose, capillary     Status: None   Collection Time: 02/04/20  8:45 AM  Result Value Ref Range   Glucose-Capillary 77 70 - 99 mg/dL    Comment: Glucose reference range applies only to samples taken after fasting for at least 8 hours.    PERIPHERAL VASCULAR CATHETERIZATION  Result Date: 02/03/2020 Patient name: Sahith Whitehall MRN: [id number] DOB: 06/27/1965 Sex: male 02/03/2020 Pre-operative Diagnosis: ESRD Post-operative diagnosis:  Same Surgeon:  Annamarie Major Procedure Performed:  1.  Dialysis catheter instrumentation  2.  Contrast injection into the central venous system from the existing dialysis catheter  3.  Conscious sedation 30 minutes  4.  Ultrasound-guided placement of 5 French sheath into the left basilic vein Indications: The patient has very few options for dialysis access.  He is currently been using a right thigh catheter.  This is not functioning well.  He comes in today for evaluation Procedure:  The patient was identified in the holding area and taken to room 8.  The patient was then placed supine on the table and prepped and draped in the usual sterile fashion.  A time out was called.  Conscious sedation was administered with the use of IV fentanyl and Versed under continuous physician and nurse monitoring.  Heart rate, blood pressure, and oxygen saturations were  continuously monitored.  Total sedation time was 30 minutes ultrasound was used to evaluate the fistula.  I first had to get IV access as his peripheral IV did not work.  I evaluated his basilic vein on the left and it appeared to be adequate.  The left basilic vein was then cannulated under ultrasound guidance with  a micropuncture needle.  A 018 wire was advanced without resistance and a micropuncture sheath was placed.  This was used for peripheral IV during the procedure. Next the heparin was removed from the 2 ports on the dialysis catheter.  I then inserted a stiff Glidewire through both the red and blue port.  I did meet some resistance through the blue port at the tip.  This potentially could have been a fibrin sheath.  Once I passed the stiff Glidewire through the tip of the catheter multiple times, I was able to easily flush and aspirate through both ports.  I then performed venography through both ports.  This shows that the tip of the dialysis catheter is in good position and a large unnamed vein and then drains essentially.  I then inserted the appropriate volumes of heparin back into the catheter  Impression:  #1  Successful instrumentation of the right thigh dialysis catheter which is now ready for use V. Annamarie Major, M.D., [facility] [place] Office: [phone] Pager:  [phone]   Review of Systems  Constitutional: Negative for chills, diaphoresis and fever.  HENT: Negative for ear discharge, ear pain, hearing loss and tinnitus.   Eyes: Negative for photophobia and pain.  Respiratory: Negative for cough and shortness of breath.   Cardiovascular: Negative for chest pain.  Gastrointestinal: Negative for abdominal pain, nausea and vomiting.  Genitourinary: Negative for dysuria, flank pain, frequency and urgency.  Musculoskeletal: Positive for arthralgias (Right shoulder). Negative for back pain, myalgias and neck pain.  Neurological: Negative for dizziness  and headaches.  Hematological: Does not bruise/bleed easily.  Psychiatric/Behavioral: The patient is not nervous/anxious.    Blood pressure 121/62, pulse 80, temperature 97.8 F (36.6 C), temperature source Oral, resp. rate 20, weight 122.9 kg, SpO2 100 %. Physical Exam Constitutional:      General: He is not in acute distress.    Appearance: He is well-developed and well-nourished. He is not diaphoretic.  HENT:     Head: Normocephalic and atraumatic.  Eyes:     General: No scleral icterus.       Right eye: No discharge.        Left eye: No discharge.     Conjunctiva/sclera: Conjunctivae normal.  Cardiovascular:     Rate and Rhythm: Normal rate and regular rhythm.  Pulmonary:     Effort: Pulmonary effort is normal. No respiratory distress.  Chest:     Comments: Ecchymotic right ant chest wall Musculoskeletal:     Cervical back: Normal range of motion.     Comments: Right shoulder, elbow, wrist, digits- no skin wounds, severe TTP diffuse shoulder, no instability, no blocks to motion, able to ext/flex well but very little abduction 2/2 pain  Sens  Ax/R/M/U intact  Mot   Ax/ R/ PIN/ M/ AIN/ U intact  Rad 1+  Skin:    General: Skin is warm and dry.  Neurological:     Mental Status: He is alert.  Psychiatric:        Mood and Affect: Mood and affect normal.        Behavior: Behavior normal.     Assessment/Plan: Right shoulder pain -- I think most likely issue here is spontaneous hemarthrosis 2/2 his anticoagulation. There is unfortunately no treatment for this outside of ice and rest. The hemarthrosis will resolve spontaneously but usually takes several weeks into months. Arthrocentesis is contraindicated 2/2 to risk of causing septic joint. Gout is also a possibility as he has  shown proclivity to bleed into his joint during gout flares. I recommend treating him for this as well. No need for orthopedic f/u for this diagnosis.    Lisette Abu, PA-C Orthopedic  Surgery [phone] 02/04/2020, 9:04 AM

## 2020-02-04 NOTE — Plan of Care (Signed)

## 2020-02-05 DIAGNOSIS — L02416 Cutaneous abscess of left lower limb: Secondary | ICD-10-CM | POA: Diagnosis not present

## 2020-02-05 DIAGNOSIS — M10312 Gout due to renal impairment, left shoulder: Secondary | ICD-10-CM | POA: Diagnosis not present

## 2020-02-05 DIAGNOSIS — D691 Qualitative platelet defects: Secondary | ICD-10-CM | POA: Diagnosis not present

## 2020-02-05 DIAGNOSIS — T829XXD Unspecified complication of cardiac and vascular prosthetic device, implant and graft, subsequent encounter: Secondary | ICD-10-CM | POA: Diagnosis not present

## 2020-02-05 LAB — CBC
HCT: 24.8 % — ABNORMAL LOW (ref 39.0–52.0)
HCT: 26.3 % — ABNORMAL LOW (ref 39.0–52.0)
Hemoglobin: 7.6 g/dL — ABNORMAL LOW (ref 13.0–17.0)
Hemoglobin: 7.7 g/dL — ABNORMAL LOW (ref 13.0–17.0)
MCH: 28.7 pg (ref 26.0–34.0)
MCH: 27.7 pg (ref 26.0–34.0)
MCHC: 30.6 g/dL (ref 30.0–36.0)
MCHC: 29.3 g/dL — ABNORMAL LOW (ref 30.0–36.0)
MCV: 93.6 fL (ref 80.0–100.0)
MCV: 94.6 fL (ref 80.0–100.0)
Platelets: 159 10*3/uL (ref 150–400)
Platelets: 183 10*3/uL (ref 150–400)
RBC: 2.65 MIL/uL — ABNORMAL LOW (ref 4.22–5.81)
RBC: 2.78 MIL/uL — ABNORMAL LOW (ref 4.22–5.81)
RDW: 17.2 % — ABNORMAL HIGH (ref 11.5–15.5)
RDW: 16.9 % — ABNORMAL HIGH (ref 11.5–15.5)
WBC: 4.3 10*3/uL (ref 4.0–10.5)
WBC: 4.6 10*3/uL (ref 4.0–10.5)
nRBC: 0 % (ref 0.0–0.2)
nRBC: 0 % (ref 0.0–0.2)

## 2020-02-05 LAB — RENAL FUNCTION PANEL
Albumin: 2.9 g/dL — ABNORMAL LOW (ref 3.5–5.0)
Anion gap: 14 (ref 5–15)
BUN: 77 mg/dL — ABNORMAL HIGH (ref 6–20)
CO2: 25 mmol/L (ref 22–32)
Calcium: 8.7 mg/dL — ABNORMAL LOW (ref 8.9–10.3)
Chloride: 101 mmol/L (ref 98–111)
Creatinine, Ser: 14.31 mg/dL — ABNORMAL HIGH (ref 0.61–1.24)
GFR, Estimated: 4 mL/min — ABNORMAL LOW (ref >60)
Glucose, Bld: 78 mg/dL (ref 70–99)
Phosphorus: 6.5 mg/dL — ABNORMAL HIGH (ref 2.5–4.6)
Potassium: 5.8 mmol/L — ABNORMAL HIGH (ref 3.5–5.1)
Sodium: 140 mmol/L (ref 135–145)

## 2020-02-05 LAB — GLUCOSE, CAPILLARY: Glucose-Capillary: 74 mg/dL (ref 70–99)

## 2020-02-05 MED ORDER — ALTEPLASE 2 MG IJ SOLR
INTRAMUSCULAR | Status: AC
  Filled 2020-02-05: qty 2

## 2020-02-05 MED ORDER — POLYETHYLENE GLYCOL 3350 17 G PO PACK
17.0000 g | PACK | Freq: Every day | ORAL | Status: DC
  Filled 2020-02-05: qty 1

## 2020-02-05 MED ORDER — SODIUM ZIRCONIUM CYCLOSILICATE 10 G PO PACK: 10.0000 g | PACK | Freq: Once | ORAL | Status: DC

## 2020-02-05 MED ORDER — ALTEPLASE 2 MG IJ SOLR
INTRAMUSCULAR | Status: AC
  Administered 2020-02-05: 2 mg
  Filled 2020-02-05: qty 6

## 2020-02-05 MED ORDER — PROMETHAZINE HCL 25 MG/ML IJ SOLN
INTRAMUSCULAR | Status: AC
  Filled 2020-02-05: qty 1

## 2020-02-05 MED ORDER — HYDROMORPHONE HCL 1 MG/ML IJ SOLN
INTRAMUSCULAR | Status: AC
  Filled 2020-02-05: qty 2

## 2020-02-05 MED ORDER — HEPARIN SODIUM (PORCINE) 1000 UNIT/ML IJ SOLN
INTRAMUSCULAR | Status: AC
  Filled 2020-02-05: qty 4

## 2020-02-05 MED ORDER — DOXERCALCIFEROL 4 MCG/2ML IV SOLN
INTRAVENOUS | Status: AC
  Filled 2020-02-05: qty 2

## 2020-02-05 NOTE — Progress Notes (Addendum)
[facility] [place]  Subjective  - Pain with dressing changes   Objective (!) 112/58 67 97.8 F (36.6 C) (Oral) 20 100%  Intake/Output Summary (Last 24 hours) at 02/05/2020 0751 Last data filed at 02/04/2020 1730 Gross per 24 hour  Intake 548 ml  Output --  Net 548 ml    Left thigh wound   Skin edges are healthy , no purulence present in wound bed Wet to dry dressing changed at bedside   Assessment/Planning: POD # 2 Excision of left Thigh AVG 5 days post op.  HD today Continue wet to dry dressing changes BID daily care order placed.    Roxy Horseman 02/05/2020 7:51 AM --  Laboratory Lab Results: Recent Labs    02/04/20 1832 02/05/20 0301  WBC 4.9 4.3  HGB 7.2* 7.6*  HCT 23.9* 24.8*  PLT 162 159   BMET Recent Labs    02/04/20 0548 02/05/20 0301  NA 138 140  K 5.3* 5.8*  CL 100 101  CO2 25 25  GLUCOSE 94 78  BUN 65* 77*  CREATININE 12.70* 14.31*  CALCIUM 8.3* 8.7*    COAG Lab Results  Component Value Date   INR 1.1 01/28/2020   INR 1.1 12/11/2019   INR 1.0 12/01/2018   No results found for: PTT  Patient seen in HD.  Catheter not working well.  WIll plan for change in OR tomorrow.  NPO after midnight.  Annamarie Major

## 2020-02-05 NOTE — Plan of Care (Signed)

## 2020-02-05 NOTE — Progress Notes (Signed)
HD#8 Subjective:  Overnight Events: Had dialysis yesterday  This AM, patient states his pain has been better controlled. He reports being frustrated with his medical problems and not being allowed to sit in a chair for dialysis. States vascular is thinking about exchanging his HD catheter due to the low flow rate but he wound like to try sitting in a chair for dialysis first.   Objective:  Vital signs in last 24 hours: Vitals:   02/04/20 1459 02/04/20 1701 02/04/20 1957 02/05/20 0507  BP: 131/64 (!) 122/57 (!) 127/57 (!) 112/58  Pulse: 70 75 72 67  Resp: '19 16 20 20  '$ Temp: 98.1 F (36.7 C) 98.8 F (37.1 C) 98.8 F (37.1 C) 97.8 F (36.6 C)  TempSrc: Oral Oral Oral Oral  SpO2: 98% 97% 100% 100%  Weight:       Supplemental O2: Room Air SpO2: 100 %   Physical Exam:   General:  Well-appearing obese man. No acute distress CV: RRR. IV/VI systolic ejection murmur. No LE edema Pulmonary: Lungs CTAB. No wheezing or rales.  Abdominal: Soft. NT/ND. Normal BS. Left flank sinus tract w/ dressing in place without drainage. Extremities: Limited ROM of R shoulder. 2+ distal pulses.  Skin: Warm and dry. Mild ecchymosis to the lateral R chest wall. Left thigh abscess w/ dressing, no drainage. Neuro: A&Ox3. Moves all extremities. Normal sensation.    Filed Weights   02/02/20 0830 02/02/20 1019 02/03/20 1629  Weight: 124.5 kg 124.5 kg 122.9 kg     Intake/Output Summary (Last 24 hours) at 02/05/2020 0703 Last data filed at 02/04/2020 1730 Gross per 24 hour  Intake 548 ml  Output --  Net 548 ml   Net IO Since Admission: -4,579.5 mL [02/05/20 0703]  Recent Labs    02/03/20 0800 02/04/20 0845  GLUCAP 78 77     Pertinent Labs: CBC Latest Ref Rng & Units 02/05/2020 02/04/2020 02/04/2020  WBC 4.0 - 10.5 K/uL 4.3 4.9 4.0  Hemoglobin 13.0 - 17.0 g/dL 7.6(L) 7.2(L) 6.3(LL)  Hematocrit 39.0 - 52.0 % 24.8(L) 23.9(L) 21.7(L)  Platelets 150 - 400 K/uL 159 162 156    CMP Latest Ref Rng &  Units 02/05/2020 02/04/2020 02/03/2020  Glucose 70 - 99 mg/dL 78 94 109(H)  BUN 6 - 20 mg/dL 77(H) 65(H) 75(H)  Creatinine 0.61 - 1.24 mg/dL 14.31(H) 12.70(H) 13.73(H)  Sodium 135 - 145 mmol/L 140 138 138  Potassium 3.5 - 5.1 mmol/L 5.8(H) 5.3(H) 5.4(H)  Chloride 98 - 111 mmol/L 101 100 97(L)  CO2 22 - 32 mmol/L '25 25 27  '$ Calcium 8.9 - 10.3 mg/dL 8.7(L) 8.3(L) 8.7(L)  Total Protein 6.5 - 8.1 g/dL - - -  Total Bilirubin 0.3 - 1.2 mg/dL - - -  Alkaline Phos 38 - 126 U/L - - -  AST 15 - 41 U/L - - -  ALT 0 - 44 U/L - - -    Imaging: No results found.  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 male with hx of ESRD on MWF HD, HTN, CAD, severe aortic stenosis, spina bifida, chronic opioid use, L knee septic arthritis who presented with epistaxis, abscess near  L thigh fistula, and malodorous discharge from chronic wound due to prior L nephrostomy tube. Now s/p removal of infected graft.  ESRD on MWF dialysis Impaired dialysis access, resolved Hyperkalemia K+ still elevated overnight to 5.8. Receiving another HD session today. Started on daily kayexalate yesterday. Giving another dose of Lokelma today. Pt reported that HD catheter was actually clean at the cath lab instead of being exchanged. Vascular thinking about exchanging.  --Nephro following, appreciate assistance --Continue Kayexalate 30 g daily --Lokelma 10 g x1 dose --Continue ASA 81 mg and Plavix 75 mg --Renal diet   Spontaneous Ecchymosis Epistaxis - resolved AoC Anemia 2/2 CKD and IDA Thrombocytopenia, resolved Ecchymosis on bilateral chest without known trauma, stable. Hgb improved to 7.6 s/p 2u pRBC.   --Continue ESA with HD --Continue Asa and Plavix as above --Daily CBC  Chronic pain syndrome Pain relatively well-controlled with current regimen. Will continue to monitor. --Continue IV dilaudid to 2 mg q3h for breakthrough pain --Continue percocet 1 tab q6h and Oxy IR 5 mg q6h   R shoulder pain Due to hemarthrosis. Treating for possible gout. Started prednisone yesterday.  --Continue prednisone 40 mg daily --Continue pain control  Left Thigh Abscess POD 5 s/p I&D w/ graft removed. Per pharmacy, patient has received 5 days worth of Vancomycin coverage. Remains afebrile with no signs of infection.   --Wound and fungi culture with no growth --Continue wet to dry dressing changes BID per Vascular --Continue pain control.   Left flank sinus tract CT scan this admission and in Oct 2021 shows no fluid collection or sign of infection. Covered with dressing with no signs of infection --Continue daily dressing changes --Urology will see in the outpatiuent  Vitamin D deficiency Metabolic Bone Disease with Secondary Hyperparathryoidism On hecterol w/HD, sensipar, and binders. PTH elevated to 281 with low vitamin D --Continue vit D supplementation 50,000 u weekly --Continue HD  Diet: Renal diet IVF: PO intake VTE: SCDs Start: 01/28/20 0551 Code: Full PT/OT: Home Health with PT ID:  Anti-infectives (From admission, onward)   Start     Dose/Rate Route Frequency Ordered Stop   02/02/20 1200  vancomycin (VANCOCIN) IVPB 1000 mg/200 mL premix  Status:  Discontinued        1,000 mg 200 mL/hr over 60 Minutes Intravenous Every M-W-F (Hemodialysis) 02/01/20 1018 02/01/20 1024   01/30/20 1200  vancomycin (VANCOCIN) IVPB 1000 mg/200 mL premix  Status:  Discontinued        1,000 mg 200 mL/hr over 60 Minutes Intravenous Every M-W-F (Hemodialysis) 01/28/20 2202 02/01/20 1018   01/29/20 1200  vancomycin (VANCOCIN) IVPB 1000 mg/200 mL premix        1,000 mg 200 mL/hr over 60 Minutes  Intravenous Every T-Th-Sa (Hemodialysis) 01/28/20 2202 01/31/20 1159   01/29/20 1127  vancomycin (VANCOCIN) 1-5 GM/200ML-% IVPB       Note to Pharmacy: [name]   : cabinet override      01/29/20 1127 01/29/20 2329   01/28/20 1200  vancomycin (VANCOCIN) IVPB 750 mg/150 ml premix  Status:  Discontinued        750 mg 150 mL/hr over 60 Minutes Intravenous Every M-W-F (Hemodialysis) 01/28/20 1119 01/28/20 2202   01/28/20 0900  vancomycin (VANCOCIN) 2,500 mg in sodium chloride 0.9 % 500 mL IVPB        2,500 mg 250 mL/hr over 120 Minutes Intravenous  Once 01/28/20 0828 01/28/20 1221   01/28/20 0400  vancomycin (VANCOCIN) 2,500 mg in sodium chloride 0.9 % 500 mL IVPB  Status:  Discontinued        2,500 mg 250 mL/hr over 120 Minutes Intravenous  Once 01/28/20 0353 01/28/20 1530   01/28/20 0400  ceFEPIme (MAXIPIME) 2 g in sodium chloride 0.9 % 100 mL IVPB        2 g 200 mL/hr over 30 Minutes Intravenous  Once 01/28/20 0353 01/28/20 0646       Anticipated discharge to Home in 2-3 days pending HD access.  Lacinda Axon, MD 02/05/2020, 7:03 AM Pager: [facility]  Please contact the on call pager after 5 pm and on weekends at [phone].

## 2020-02-05 NOTE — Procedures (Signed)
I was present at this dialysis session. I have reviewed the session itself and made appropriate changes.   Vital signs in last 24 hours:  Temp:  [97.8 F (36.6 C)-98.8 F (37.1 C)] 97.8 F (36.6 C) (02/03 0507) Pulse Rate:  [67-77] 67 (02/03 0507) Resp:  [16-20] 20 (02/03 0507) BP: (109-131)/(53-64) 112/58 (02/03 0507) SpO2:  [97 %-100 %] 100 % (02/03 0507) Weight change:  Filed Weights   02/02/20 0830 02/02/20 1019 02/03/20 1629  Weight: 124.5 kg 124.5 kg 122.9 kg    Recent Labs  Lab 02/05/20 0301  NA 140  K 5.8*  CL 101  CO2 25  GLUCOSE 78  BUN 77*  CREATININE 14.31*  CALCIUM 8.7*  PHOS 6.5*    Recent Labs  Lab 01/31/20 0330 02/01/20 0108 02/02/20 0101 02/03/20 0308 02/04/20 0548 02/04/20 1832 02/05/20 0301  WBC 5.0   < > 4.1   < > 4.0 4.9 4.3  NEUTROABS 3.5  --  2.8  --   --   --   --   HGB 8.1*   < > 7.3*   < > 6.3* 7.2* 7.6*  HCT 26.4*   < > 23.2*   < > 21.7* 23.9* 24.8*  MCV 93.6   < > 93.2   < > 96.9 94.1 93.6  PLT 115*   < > 131*   < > 156 162 159   < > = values in this interval not displayed.    Scheduled Meds: . promethazine      . aspirin EC  81 mg Oral Daily  . calcium acetate  2,668 mg Oral TID WC  . Chlorhexidine Gluconate Cloth  6 each Topical Q0600  . cholecalciferol  1,000 Units Oral Daily  . cinacalcet  30 mg Oral Q M,W,F-HD  . clopidogrel  75 mg Oral Daily  . doxercalciferol  4 mcg Intravenous Q M,W,F-HD  . famotidine  40 mg Oral Daily  . heparin  4,000 Units Dialysis Once in dialysis  .  HYDROmorphone (DILAUDID) injection  0.5 mg Intravenous Once  . levothyroxine  137 mcg Oral Q0600  . melatonin  10 mg Oral QHS  . oxyCODONE-acetaminophen  1 tablet Oral Q6H   And  . oxyCODONE  5 mg Oral Q6H  . predniSONE  40 mg Oral Q breakfast  . sodium chloride flush  3 mL Intravenous Q12H  . sodium polystyrene  30 g Oral Daily  . sodium zirconium cyclosilicate  10 g Oral Once  . traZODone  100 mg Oral QHS  . zolpidem  10 mg Oral QHS    Continuous Infusions: . sodium chloride    . sodium chloride 10 mL/hr at 01/30/20 1027  . sodium chloride    . sodium chloride     PRN Meds:.sodium chloride, sodium chloride, sodium chloride, alteplase, fluticasone, heparin, heparin, heparin, HYDROmorphone (DILAUDID) injection, lidocaine (PF), lidocaine-prilocaine, pentafluoroprop-tetrafluoroeth, promethazine, sodium chloride flush     OP HD:AF MWF 4h 60mn 350/800 118kg 2/2.25 bath Hep 3700 R thigh TDC Sensipar 30 mg q HD Hectorol 4 mcg q HD Mircera 225 mcg IV q 2 weeks- last dose 200 mcg on 1/17, due on 1/31   Assessment/ Plan: 1. SP removal L thigh infected AVG material (old AVG's): on 1/28 yesterday, by [facility]. Appreciated assistance. Cx's pend, on IV vanc 2. L flank sinus tract:from site of previous nephrostomy tube. On antibiotics as above.  3. ESRD:MWF schedule. Last HD was 1/27.HD 1/29 was not successful as catheter could not be  accessed. 1. Wants to run in recliner but HD recliner not available. 4. HD access: pt has been an end-stage access patient for many years now, f/b [facility] for the most part. [place]. TDC patency over the years has been assisted by regular TPA locks and, per patient, ASA and plavix have helped a lot to keep HD cath from clotting. ASA/ plavix are on hold now due to bruising/ low plts / low Hb. As soon as possible would like to resume ASA +/- Plavix, will help w/ TDC patency.Unfortunately could not run faster than 150 bfr.  1. Consulted Dr. Trula Slade for right fem HD catheter manipulation which was performed 02/03/20.  HD able to be completed but max BFR was only 175. 2. Activase placed and have to run reversed as arterial port collapses. 5. Hypertension/volume:BP controlled. EDw raised recently. UF as tol.  6. Anemia:Hgb has been running low since last admission. ESA due 1/31. Pt requesting his full OP dose of IV heparinw/ inpatient HD (=  3700 units). 7. Metabolic bone disease:Continue sensipar, hectorol and binders. Follow phos/calcium 8. Hyperkalemia- pt normally takes kayexalate at home and will resume to help with hyperkalemia.   Donetta Potts,  MD 02/05/2020, 8:27 AM

## 2020-02-05 NOTE — H&P (View-Only) (Signed)
[facility]  Subjective  - Pain with dressing changes   Objective (!) 112/58 67 97.8 F (36.6 C) (Oral) 20 100%  Intake/Output Summary (Last 24 hours) at 02/05/2020 0751 Last data filed at 02/04/2020 1730 Gross per 24 hour  Intake 548 ml  Output --  Net 548 ml    Left thigh wound   Skin edges are healthy , no purulence present in wound bed Wet to dry dressing changed at bedside   Assessment/Planning: POD # 2 Excision of left Thigh AVG 5 days post op.  HD today Continue wet to dry dressing changes BID daily care order placed.    Roxy Horseman 02/05/2020 7:51 AM --  Laboratory Lab Results: Recent Labs    02/04/20 1832 02/05/20 0301  WBC 4.9 4.3  HGB 7.2* 7.6*  HCT 23.9* 24.8*  PLT 162 159   BMET Recent Labs    02/04/20 0548 02/05/20 0301  NA 138 140  K 5.3* 5.8*  CL 100 101  CO2 25 25  GLUCOSE 94 78  BUN 65* 77*  CREATININE 12.70* 14.31*  CALCIUM 8.3* 8.7*    COAG Lab Results  Component Value Date   INR 1.1 01/28/2020   INR 1.1 12/11/2019   INR 1.0 12/01/2018   No results found for: PTT  Patient seen in HD.  Catheter not working well.  WIll plan for change in OR tomorrow.  NPO after midnight.  Annamarie Major

## 2020-02-05 NOTE — Progress Notes (Signed)
Pt had 2 units of RBCs ordered early today , and only 1 unit given.  MD paged to clarify if second unit of blood is still neccessary. New order for CBC STAT placed. Results available in Epic for review, and MD notified. Will hold blood at this time per MD.   Pt c/o constipation, MD paged and stool softener requested. In the meantime pt attempted manual disimpaction and had BM. Small amount of blood noted with BM, most likely from irritation. No further bleeding. MD aware of the situation. Will continue to monitor pt.

## 2020-02-05 NOTE — Progress Notes (Signed)
Patient has been increasingly asking for pain medicine all night, requesting dilaudid q3 hours and phenegran. When RN asked patient about pain source and why so many frequent requests, patient states that he "broke both shoulder blades in 1998 and this shoulder pain is different." Yes pain is subjective, however the requests for dilaudid and phenegran have increased for [name]  Especially since the dosage has increased from '1mg'$  to '2mg'$ . He states his pain has just gotten worse despite the increase and frequency. He has slept in his chair all night, vitals signs have remained stable. Again, patient reports pain from a shoulder blade incident that happened in 1998.Marland KitchenMarland KitchenMarland Kitchen

## 2020-02-06 ENCOUNTER — Encounter (HOSPITAL_COMMUNITY): Payer: Self-pay | Admitting: Internal Medicine

## 2020-02-06 ENCOUNTER — Inpatient Hospital Stay (HOSPITAL_COMMUNITY): Payer: Medicare Other

## 2020-02-06 ENCOUNTER — Encounter (HOSPITAL_COMMUNITY)
Admission: EM | Disposition: A | Discharge status: Home Health | Payer: Self-pay | Source: Home / Self Care | Attending: Internal Medicine

## 2020-02-06 ENCOUNTER — Inpatient Hospital Stay (HOSPITAL_COMMUNITY): Payer: Medicare Other | Admitting: Anesthesiology

## 2020-02-06 DIAGNOSIS — L02416 Cutaneous abscess of left lower limb: Secondary | ICD-10-CM | POA: Diagnosis not present

## 2020-02-06 DIAGNOSIS — T829XXD Unspecified complication of cardiac and vascular prosthetic device, implant and graft, subsequent encounter: Secondary | ICD-10-CM | POA: Diagnosis not present

## 2020-02-06 DIAGNOSIS — N186 End stage renal disease: Secondary | ICD-10-CM | POA: Diagnosis not present

## 2020-02-06 HISTORY — PX: EXCHANGE OF A DIALYSIS CATHETER: SHX5818

## 2020-02-06 LAB — POCT I-STAT, CHEM 8
BUN: 81 mg/dL — ABNORMAL HIGH (ref 6–20)
BUN: 81 mg/dL — ABNORMAL HIGH (ref 6–20)
Calcium, Ion: 1.17 mmol/L (ref 1.15–1.40)
Calcium, Ion: 1.16 mmol/L (ref 1.15–1.40)
Chloride: 99 mmol/L (ref 98–111)
Chloride: 100 mmol/L (ref 98–111)
Creatinine, Ser: 13 mg/dL — ABNORMAL HIGH (ref 0.61–1.24)
Creatinine, Ser: 12.9 mg/dL — ABNORMAL HIGH (ref 0.61–1.24)
Glucose, Bld: 92 mg/dL (ref 70–99)
Glucose, Bld: 106 mg/dL — ABNORMAL HIGH (ref 70–99)
HCT: 25 % — ABNORMAL LOW (ref 39.0–52.0)
HCT: 21 % — ABNORMAL LOW (ref 39.0–52.0)
Hemoglobin: 8.5 g/dL — ABNORMAL LOW (ref 13.0–17.0)
Hemoglobin: 7.1 g/dL — ABNORMAL LOW (ref 13.0–17.0)
Potassium: 5.4 mmol/L — ABNORMAL HIGH (ref 3.5–5.1)
Potassium: 6.1 mmol/L — ABNORMAL HIGH (ref 3.5–5.1)
Sodium: 138 mmol/L (ref 135–145)
Sodium: 135 mmol/L (ref 135–145)
TCO2: 27 mmol/L (ref 22–32)
TCO2: 23 mmol/L (ref 22–32)

## 2020-02-06 LAB — PREPARE RBC (CROSSMATCH)

## 2020-02-06 LAB — GLUCOSE, CAPILLARY
Glucose-Capillary: 98 mg/dL (ref 70–99)
Glucose-Capillary: 83 mg/dL (ref 70–99)

## 2020-02-06 SURGERY — EXCHANGE OF A DIALYSIS CATHETER
Anesthesia: General | Site: Thigh | Laterality: Right

## 2020-02-06 MED ORDER — FENTANYL CITRATE (PF) 250 MCG/5ML IJ SOLN
INTRAMUSCULAR | Status: DC | PRN
  Administered 2020-02-06: 100 ug via INTRAVENOUS
  Administered 2020-02-06 (×2): 50 ug via INTRAVENOUS

## 2020-02-06 MED ORDER — SODIUM CHLORIDE 0.9 % IV SOLN: INTRAVENOUS | Status: DC

## 2020-02-06 MED ORDER — FENTANYL CITRATE (PF) 100 MCG/2ML IJ SOLN
25.0000 ug | INTRAMUSCULAR | Status: DC | PRN
  Administered 2020-02-06: 50 ug via INTRAVENOUS

## 2020-02-06 MED ORDER — ALBUMIN HUMAN 5 % IV SOLN: INTRAVENOUS | Status: DC | PRN

## 2020-02-06 MED ORDER — PHENYLEPHRINE HCL-NACL 10-0.9 MG/250ML-% IV SOLN
INTRAVENOUS | Status: DC | PRN
  Administered 2020-02-06: 25 ug/min via INTRAVENOUS

## 2020-02-06 MED ORDER — PROPOFOL 1000 MG/100ML IV EMUL
INTRAVENOUS | Status: AC
  Filled 2020-02-06: qty 100

## 2020-02-06 MED ORDER — ONDANSETRON HCL 4 MG/2ML IJ SOLN: 4.0000 mg | Freq: Once | INTRAMUSCULAR | Status: DC | PRN

## 2020-02-06 MED ORDER — LIDOCAINE 2% (20 MG/ML) 5 ML SYRINGE
INTRAMUSCULAR | Status: DC | PRN
  Administered 2020-02-06: 100 mg via INTRAVENOUS

## 2020-02-06 MED ORDER — CALCIUM CHLORIDE 10 % IV SOLN
INTRAVENOUS | Status: DC | PRN
  Administered 2020-02-06: 400 mg via INTRAVENOUS
  Administered 2020-02-06: 300 mg via INTRAVENOUS

## 2020-02-06 MED ORDER — MIDAZOLAM HCL 2 MG/2ML IJ SOLN
INTRAMUSCULAR | Status: AC
  Filled 2020-02-06: qty 2

## 2020-02-06 MED ORDER — PHENYLEPHRINE HCL (PRESSORS) 10 MG/ML IV SOLN
INTRAVENOUS | Status: AC
  Filled 2020-02-06: qty 2

## 2020-02-06 MED ORDER — HEPARIN SODIUM (PORCINE) 1000 UNIT/ML IJ SOLN
INTRAMUSCULAR | Status: AC
  Filled 2020-02-06: qty 1

## 2020-02-06 MED ORDER — ONDANSETRON HCL 4 MG/2ML IJ SOLN
INTRAMUSCULAR | Status: DC | PRN
  Administered 2020-02-06 (×2): 4 mg via INTRAVENOUS

## 2020-02-06 MED ORDER — LIDOCAINE-EPINEPHRINE 1 %-1:100000 IJ SOLN
INTRAMUSCULAR | Status: AC
  Filled 2020-02-06: qty 1

## 2020-02-06 MED ORDER — ORAL CARE MOUTH RINSE: 15.0000 mL | Freq: Once | OROMUCOSAL | Status: AC

## 2020-02-06 MED ORDER — 0.9 % SODIUM CHLORIDE (POUR BTL) OPTIME
TOPICAL | Status: DC | PRN
  Administered 2020-02-06: 1000 mL

## 2020-02-06 MED ORDER — SODIUM CHLORIDE 0.9 % IV SOLN: 10.0000 mL/h | Freq: Once | INTRAVENOUS | Status: DC

## 2020-02-06 MED ORDER — GLYCOPYRROLATE PF 0.2 MG/ML IJ SOSY
PREFILLED_SYRINGE | INTRAMUSCULAR | Status: AC
  Filled 2020-02-06: qty 1

## 2020-02-06 MED ORDER — OXYCODONE HCL 5 MG PO TABS: 5.0000 mg | ORAL_TABLET | Freq: Once | ORAL | Status: DC | PRN

## 2020-02-06 MED ORDER — GLYCOPYRROLATE PF 0.2 MG/ML IJ SOSY
PREFILLED_SYRINGE | INTRAMUSCULAR | Status: DC | PRN
  Administered 2020-02-06: .2 mg via INTRAVENOUS

## 2020-02-06 MED ORDER — HEPARIN SODIUM (PORCINE) 1000 UNIT/ML IJ SOLN
INTRAMUSCULAR | Status: DC | PRN
  Administered 2020-02-06: 6200 [IU]

## 2020-02-06 MED ORDER — CEFAZOLIN SODIUM-DEXTROSE 2-3 GM-%(50ML) IV SOLR
INTRAVENOUS | Status: DC | PRN
  Administered 2020-02-06: 2 g via INTRAVENOUS

## 2020-02-06 MED ORDER — SUGAMMADEX SODIUM 200 MG/2ML IV SOLN
INTRAVENOUS | Status: DC | PRN
  Administered 2020-02-06: 300 mg via INTRAVENOUS

## 2020-02-06 MED ORDER — CEFAZOLIN SODIUM 1 G IJ SOLR
INTRAMUSCULAR | Status: AC
  Filled 2020-02-06: qty 20

## 2020-02-06 MED ORDER — ROCURONIUM BROMIDE 10 MG/ML (PF) SYRINGE
PREFILLED_SYRINGE | INTRAVENOUS | Status: DC | PRN
  Administered 2020-02-06: 20 mg via INTRAVENOUS
  Administered 2020-02-06: 60 mg via INTRAVENOUS
  Administered 2020-02-06: 40 mg via INTRAVENOUS
  Administered 2020-02-06: 20 mg via INTRAVENOUS

## 2020-02-06 MED ORDER — MIDAZOLAM HCL 5 MG/5ML IJ SOLN
INTRAMUSCULAR | Status: DC | PRN
  Administered 2020-02-06: 2 mg via INTRAVENOUS

## 2020-02-06 MED ORDER — OXYCODONE HCL 5 MG/5ML PO SOLN: 5.0000 mg | Freq: Once | ORAL | Status: DC | PRN

## 2020-02-06 MED ORDER — CHLORHEXIDINE GLUCONATE 0.12 % MT SOLN
15.0000 mL | Freq: Once | OROMUCOSAL | Status: AC
  Administered 2020-02-06: 15 mL via OROMUCOSAL
  Filled 2020-02-06: qty 15

## 2020-02-06 MED ORDER — PROPOFOL 10 MG/ML IV BOLUS
INTRAVENOUS | Status: DC | PRN
  Administered 2020-02-06: 30 mg via INTRAVENOUS
  Administered 2020-02-06: 70 mg via INTRAVENOUS

## 2020-02-06 MED ORDER — ROCURONIUM BROMIDE 10 MG/ML (PF) SYRINGE
PREFILLED_SYRINGE | INTRAVENOUS | Status: AC
  Filled 2020-02-06: qty 20

## 2020-02-06 MED ORDER — IODIXANOL 320 MG/ML IV SOLN
INTRAVENOUS | Status: DC | PRN
  Administered 2020-02-06: 10 mL via INTRAVENOUS

## 2020-02-06 MED ORDER — SODIUM CHLORIDE 0.9 % IV SOLN
INTRAVENOUS | Status: DC | PRN
  Administered 2020-02-06: 500 mL

## 2020-02-06 MED ORDER — SODIUM CHLORIDE 0.9 % IV SOLN
INTRAVENOUS | Status: AC
  Filled 2020-02-06: qty 1.2

## 2020-02-06 MED ORDER — FENTANYL CITRATE (PF) 100 MCG/2ML IJ SOLN
INTRAMUSCULAR | Status: AC
  Filled 2020-02-06: qty 2

## 2020-02-06 MED ORDER — FENTANYL CITRATE (PF) 250 MCG/5ML IJ SOLN
INTRAMUSCULAR | Status: AC
  Filled 2020-02-06: qty 5

## 2020-02-06 SURGICAL SUPPLY — 72 items
BAG DECANTER FOR FLEXI CONT (MISCELLANEOUS) ×2 IMPLANT
BAG SNAP BAND KOVER 36X36 (MISCELLANEOUS) ×1 IMPLANT
BIOPATCH RED 1 DISK 7.0 (GAUZE/BANDAGES/DRESSINGS) ×2 IMPLANT
BLADE SURG 11 STRL SS (BLADE) ×1 IMPLANT
CATH PALINDROME-P 19CM W/VT (CATHETERS) IMPLANT
CATH PALINDROME-P 23CM W/VT (CATHETERS) IMPLANT
CATH PALINDROME-P 28CM W/VT (CATHETERS) IMPLANT
CLIP VESOCCLUDE MED 6/CT (CLIP) ×1 IMPLANT
CLIP VESOCCLUDE SM WIDE 6/CT (CLIP) ×1 IMPLANT
COVER DOME SNAP 22 D (MISCELLANEOUS) ×1 IMPLANT
COVER PROBE W GEL 5X96 (DRAPES) ×2 IMPLANT
COVER SURGICAL LIGHT HANDLE (MISCELLANEOUS) ×2 IMPLANT
COVER WAND RF STERILE (DRAPES) ×1 IMPLANT
DERMABOND ADVANCED (GAUZE/BANDAGES/DRESSINGS) ×1
DERMABOND ADVANCED .7 DNX12 (GAUZE/BANDAGES/DRESSINGS) ×1 IMPLANT
DRAPE CHEST BREAST 15X10 FENES (DRAPES) ×2 IMPLANT
ELECT BLADE 4.0 EZ CLEAN MEGAD (MISCELLANEOUS) ×2
ELECTRODE BLDE 4.0 EZ CLN MEGD (MISCELLANEOUS) IMPLANT
GAUZE 4X4 16PLY RFD (DISPOSABLE) ×2 IMPLANT
GAUZE SPONGE 2X2 8PLY STRL LF (GAUZE/BANDAGES/DRESSINGS) IMPLANT
GAUZE SPONGE 4X4 16PLY XRAY LF (GAUZE/BANDAGES/DRESSINGS) ×1 IMPLANT
GLIDEWIRE ADV .035X180CM (WIRE) ×1 IMPLANT
GLOVE BIO SURGEON STRL SZ7.5 (GLOVE) ×1 IMPLANT
GLOVE BIOGEL PI IND STRL 7.5 (GLOVE) ×1 IMPLANT
GLOVE BIOGEL PI INDICATOR 7.5 (GLOVE) ×1
GLOVE SURG SS PI 7.5 STRL IVOR (GLOVE) ×2 IMPLANT
GOWN STRL REUS W/ TWL LRG LVL3 (GOWN DISPOSABLE) ×2 IMPLANT
GOWN STRL REUS W/ TWL XL LVL3 (GOWN DISPOSABLE) ×1 IMPLANT
GOWN STRL REUS W/TWL LRG LVL3 (GOWN DISPOSABLE) ×2
GOWN STRL REUS W/TWL XL LVL3 (GOWN DISPOSABLE) ×1
KIT BASIN OR (CUSTOM PROCEDURE TRAY) ×2 IMPLANT
KIT PALINDROME-P 55CM (CATHETERS) ×1 IMPLANT
KIT TURNOVER KIT B (KITS) ×2 IMPLANT
LOOP VESSEL MAXI BLUE (MISCELLANEOUS) ×1 IMPLANT
LOOP VESSEL MINI RED (MISCELLANEOUS) ×1 IMPLANT
NDL 18GX1X1/2 (RX/OR ONLY) (NEEDLE) ×1 IMPLANT
NDL HYPO 25GX1X1/2 BEV (NEEDLE) ×1 IMPLANT
NEEDLE 18GX1X1/2 (RX/OR ONLY) (NEEDLE) ×2 IMPLANT
NEEDLE HYPO 25GX1X1/2 BEV (NEEDLE) ×2 IMPLANT
NS IRRIG 1000ML POUR BTL (IV SOLUTION) ×2 IMPLANT
PACK SURGICAL SETUP 50X90 (CUSTOM PROCEDURE TRAY) ×2 IMPLANT
PAD ARMBOARD 7.5X6 YLW CONV (MISCELLANEOUS) ×4 IMPLANT
PENCIL BUTTON HOLSTER BLD 10FT (ELECTRODE) ×1 IMPLANT
SOAP 2 % CHG 4 OZ (WOUND CARE) ×2 IMPLANT
SPONGE GAUZE 2X2 STER 10/PKG (GAUZE/BANDAGES/DRESSINGS)
SPONGE INTESTINAL PEANUT (DISPOSABLE) ×1 IMPLANT
SPONGE LAP 18X18 X RAY DECT (DISPOSABLE) ×1 IMPLANT
SUT ETHILON 3 0 PS 1 (SUTURE) ×2 IMPLANT
SUT PROLENE 4 0 RB 1 (SUTURE) ×1
SUT PROLENE 4-0 RB1 .5 CRCL 36 (SUTURE) IMPLANT
SUT PROLENE 5 0 C 1 24 (SUTURE) ×4 IMPLANT
SUT SILK 2 0 (SUTURE) ×1
SUT SILK 2-0 18XBRD TIE 12 (SUTURE) IMPLANT
SUT SILK 3 0 (SUTURE) ×1
SUT SILK 3-0 18XBRD TIE 12 (SUTURE) IMPLANT
SUT SILK 4 0 (SUTURE) ×1
SUT SILK 4-0 18XBRD TIE 12 (SUTURE) IMPLANT
SUT VIC AB 2-0 CT1 27 (SUTURE) ×1
SUT VIC AB 2-0 CT1 TAPERPNT 27 (SUTURE) IMPLANT
SUT VIC AB 3-0 SH 27 (SUTURE) ×1
SUT VIC AB 3-0 SH 27X BRD (SUTURE) IMPLANT
SUT VIC AB 4-0 PS2 18 (SUTURE) ×1 IMPLANT
SUT VICRYL 4-0 PS2 18IN ABS (SUTURE) ×2 IMPLANT
SYR 10ML LL (SYRINGE) ×6 IMPLANT
SYR 20ML LL LF (SYRINGE) ×4 IMPLANT
SYR 30ML LL (SYRINGE) IMPLANT
SYR 5ML LL (SYRINGE) ×2 IMPLANT
SYR BULB IRRIG 60ML STRL (SYRINGE) ×1 IMPLANT
SYR CONTROL 10ML LL (SYRINGE) ×2 IMPLANT
TOWEL GREEN STERILE (TOWEL DISPOSABLE) ×2 IMPLANT
WATER STERILE IRR 1000ML POUR (IV SOLUTION) ×2 IMPLANT
WIRE BENTSON .035X145CM (WIRE) ×1 IMPLANT

## 2020-02-06 NOTE — Progress Notes (Addendum)
Patient ID: [name], male   DOB: 04-Dec-1965, 55 y.o.   MRN: [id number] S: No new complaints.  Came back from OR and c/o mouth being dry. O:BP (!) 103/50   Pulse 69   Temp (!) 97 F (36.1 C)   Resp 12   Wt 119.7 kg   SpO2 100%   BMI 36.81 kg/m   Intake/Output Summary (Last 24 hours) at 02/06/2020 1713 Last data filed at 02/06/2020 1536 Gross per 24 hour  Intake 1115 ml  Output 500 ml  Net 615 ml   Intake/Output: I/O last 3 completed shifts: In: -  Out: 3000 [Other:3000]  Intake/Output this shift:  Total I/O In: 1115 [I.V.:500; Blood:315; IV Piggyback:300] Out: 500 [Blood:500] Weight change:  Gen: NAD CVS: RRR Resp: cta Abd: benign Ext: 1+ edema, right fem HD cath in place  Recent Labs  Lab 01/31/20 0330 01/31/20 1157 02/01/20 0108 02/02/20 0101 02/03/20 0308 02/04/20 0548 02/05/20 0301 02/06/20 1226 02/06/20 1502  NA 137  --  137 137 138 138 140 138 135  K 5.3*  --  4.8 5.0 5.4* 5.3* 5.8* 5.4* 6.1*  CL 96*  --  95* 95* 97* 100 101 99 100  CO2 25  --  '28 28 27 25 25  '$ --   --   GLUCOSE 78  --  113* 98 109* 94 78 92 106*  BUN 75*  --  64* 74* 75* 65* 77* 81* 81*  CREATININE 12.36*  --  11.64* 13.43* 13.73* 12.70* 14.31* 13.00* 12.90*  ALBUMIN 3.3*  --  2.8* 2.8* 3.1* 2.7* 2.9*  --   --   CALCIUM 8.4* 8.5* 8.4* 8.3* 8.7* 8.3* 8.7*  --   --   PHOS 6.5*  --  5.7* 6.1* 6.2* 6.4* 6.5*  --   --    Liver Function Tests: Recent Labs  Lab 02/03/20 0308 02/04/20 0548 02/05/20 0301  ALBUMIN 3.1* 2.7* 2.9*   No results for input(s): LIPASE, AMYLASE in the last 168 hours. No results for input(s): AMMONIA in the last 168 hours. CBC: Recent Labs  Lab 01/31/20 0330 02/01/20 0108 02/02/20 0101 02/03/20 0308 02/04/20 0548 02/04/20 1832 02/05/20 0301 02/05/20 2237 02/06/20 1226 02/06/20 1502  WBC 5.0   < > 4.1 4.7 4.0 4.9 4.3 4.6  --   --   NEUTROABS 3.5  --  2.8  --   --   --   --   --   --   --   HGB 8.1*   < > 7.3* 7.0* 6.3* 7.2* 7.6* 7.7* 8.5* 7.1*   HCT 26.4*   < > 23.2* 23.8* 21.7* 23.9* 24.8* 26.3* 25.0* 21.0*  MCV 93.6   < > 93.2 94.8 96.9 94.1 93.6 94.6  --   --   PLT 115*   < > 131* 150 156 162 159 183  --   --    < > = values in this interval not displayed.   Cardiac Enzymes: No results for input(s): CKTOTAL, CKMB, CKMBINDEX, TROPONINI in the last 168 hours. CBG: Recent Labs  Lab 02/03/20 0800 02/04/20 0845 02/05/20 0746 02/06/20 0744 02/06/20 1149  GLUCAP 78 77 74 98 83    Iron Studies: No results for input(s): IRON, TIBC, TRANSFERRIN, FERRITIN in the last 72 hours. Studies/Results: HYBRID OR IMAGING ([facility] ONLY)  Result Date: 02/06/2020 There is no interpretation for this exam.  This order is for images obtained during a surgical procedure.  Please See "Surgeries" Tab for more information regarding  the procedure.   Marland Kitchen aspirin EC  81 mg Oral Daily  . calcium acetate  2,668 mg Oral TID WC  . Chlorhexidine Gluconate Cloth  6 each Topical Q0600  . cholecalciferol  1,000 Units Oral Daily  . cinacalcet  30 mg Oral Q M,W,F-HD  . clopidogrel  75 mg Oral Daily  . doxercalciferol  4 mcg Intravenous Q M,W,F-HD  . famotidine  40 mg Oral Daily  . fentaNYL      .  HYDROmorphone (DILAUDID) injection  0.5 mg Intravenous Once  . levothyroxine  137 mcg Oral Q0600  . melatonin  10 mg Oral QHS  . oxyCODONE-acetaminophen  1 tablet Oral Q6H   And  . oxyCODONE  5 mg Oral Q6H  . polyethylene glycol  17 g Oral Daily  . predniSONE  40 mg Oral Q breakfast  . sodium chloride flush  3 mL Intravenous Q12H  . sodium polystyrene  30 g Oral Daily  . sodium zirconium cyclosilicate  10 g Oral Once  . traZODone  100 mg Oral QHS  . zolpidem  10 mg Oral QHS    BMET    Component Value Date/Time   NA 135 02/06/2020 1502   NA 135 (A) 01/17/2018 0000   K 6.1 (H) 02/06/2020 1502   CL 100 02/06/2020 1502   CO2 25 02/05/2020 0301   GLUCOSE 106 (H) 02/06/2020 1502   BUN 81 (H) 02/06/2020 1502   BUN 67 (A) 01/17/2018 0000   CREATININE 12.90 (H)  02/06/2020 1502   CALCIUM 8.7 (L) 02/05/2020 0301   CALCIUM 8.5 (L) 01/31/2020 1157   GFRNONAA 4 (L) 02/05/2020 0301   GFRAA 10 (L) 10/07/2019 0430   CBC    Component Value Date/Time   WBC 4.6 02/05/2020 2237   RBC 2.78 (L) 02/05/2020 2237   HGB 7.1 (L) 02/06/2020 1502   HCT 21.0 (L) 02/06/2020 1502   PLT 183 02/05/2020 2237   MCV 94.6 02/05/2020 2237   MCH 27.7 02/05/2020 2237   MCHC 29.3 (L) 02/05/2020 2237   RDW 16.9 (H) 02/05/2020 2237   LYMPHSABS 0.5 (L) 02/02/2020 0101   MONOABS 0.4 02/02/2020 0101   EOSABS 0.3 02/02/2020 0101   BASOSABS 0.0 02/02/2020 0101    OP HD:AF MWF 4h 4mn 350/800 118kg 2/2.25 bath Hep 3700 R thigh TDC Sensipar 30 mg q HD Hectorol 4 mcg q HD Mircera 225 mcg IV q 2 weeks- last dose 200 mcg on 1/17, due on 1/31   Assessment/ Plan: 1. SP removal L thigh infected AVG material (old AVG's): on 1/28 yesterday, by [facility]. Appreciated assistance. Cx's pend, on IV vanc 2. L flank sinus tract:from site of previous nephrostomy tube. On antibiotics as above.  3. ESRD:MWF schedule. Last HD was 1/27.HD 1/29 was not successful as catheter could not be accessed. 1. Off schedule due to issues with access.  Will plan for HD either tonight or tomorrow morning. 4. HD access: pt has been an end-stage access patient for many years now, f/b [facility] for the most part. His [place] 3 [place]. TDC patency over the years has been assisted by regular TPA locks and, per patient, ASA and plavix have helped a lot to keep HD cath from clotting. ASA/ plavix are on hold now due to bruising/ low plts / low Hb. As soon as possible would like to resume ASA +/- Plavix, will help w/ TDC patency.Unfortunately could not run faster than 150 bfr.  1. Consulted Dr. Trula Slade for right fem HD cathetermanipulation which was performed 02/03/20. HD able to be completed but max BFR was only 175. 2. Activase placed and have to run reversed as  arterial port collapses. 3. Went to OR on 02/06/20 for exchange, will plan for HD today or early tomorrow depending upon schedule. 5. Hypertension/volume:BP controlled. EDw raised recently. UF as tol.  6. Anemia:Hgb has been running low since last admission. ESA due 1/31. Pt requesting his full OP dose of IV heparinw/ inpatient HD (= 3700 units). 7. Metabolic bone disease:Continue sensipar, hectorol and binders. Follow phos/calcium 8. Hyperkalemia- pt normally takes kayexalate at home and will resume to help with hyperkalemia.   Donetta Potts, MD [employer] [phone]

## 2020-02-06 NOTE — Progress Notes (Signed)
HD#9 Subjective:  Overnight Events:   Patient states he slept fine. His pain is better with the new pain regimen. He was made NPO overnight but was not informed that vascular surgery were planning to exchange his catheter. He is frustrated with not being able to trying sitting for dialysis to see if it helps with the flow rate. States he did not take his Plavix due to anticipation for surgery.   Objective:  Vital signs in last 24 hours: Vitals:   02/05/20 1124 02/05/20 1130 02/05/20 1209 02/05/20 2123  BP: (!) 105/52 (!) 117/57 (!) 119/58 111/64  Pulse:   70 77  Resp: '11  16 20  '$ Temp:  98.6 F (37 C)  99.4 F (37.4 C)  TempSrc:  Oral    SpO2:   100% 100%  Weight:       Supplemental O2: Room Air SpO2: 100 %   Physical Exam:   General:  Well-appearing obese man laying in chair. NAD CV: RRR. IIV/VI systolic ejection murmur. No LE edema Pulmonary: Lungs CTAB. No wheezing or rales. Abdominal: Soft. NT/ND. Left flank sinus tract w/ dressing in place without drainage. Extremities: Limited ROM of R shoulder. 2+ distal pulses.  Skin: Warm and dry. Mild ecchymosis to the lateral R chest wall. Left thigh abscess w/ drainage and dressing soaked with blood.  Neuro: A&Ox3. Moves all extremities. Psych: Frustrated mood.    Filed Weights   02/03/20 1629 02/05/20 0814 02/05/20 1100  Weight: 122.9 kg 122.7 kg 119.7 kg     Intake/Output Summary (Last 24 hours) at 02/06/2020 0558 Last data filed at 02/05/2020 1124 Gross per 24 hour  Intake --  Output 3000 ml  Net -3000 ml   Net IO Since Admission: -7,579.5 mL [02/06/20 0558]  Recent Labs    02/03/20 0800 02/04/20 0845 02/05/20 0746  GLUCAP 78 77 74     Pertinent Labs: CBC Latest Ref Rng & Units 02/05/2020 02/05/2020 02/04/2020  WBC 4.0 - 10.5 K/uL 4.6 4.3 4.9  Hemoglobin 13.0 - 17.0 g/dL 7.7(L) 7.6(L) 7.2(L)  Hematocrit 39.0 - 52.0 % 26.3(L) 24.8(L) 23.9(L)  Platelets 150 - 400 K/uL 183 159 162    CMP Latest Ref Rng & Units  02/05/2020 02/04/2020 02/03/2020  Glucose 70 - 99 mg/dL 78 94 109(H)  BUN 6 - 20 mg/dL 77(H) 65(H) 75(H)  Creatinine 0.61 - 1.24 mg/dL 14.31(H) 12.70(H) 13.73(H)  Sodium 135 - 145 mmol/L 140 138 138  Potassium 3.5 - 5.1 mmol/L 5.8(H) 5.3(H) 5.4(H)  Chloride 98 - 111 mmol/L 101 100 97(L)  CO2 22 - 32 mmol/L '25 25 27  '$ Calcium 8.9 - 10.3 mg/dL 8.7(L) 8.3(L) 8.7(L)  Total Protein 6.5 - 8.1 g/dL - - -  Total Bilirubin 0.3 - 1.2 mg/dL - - -  Alkaline Phos 38 - 126 U/L - - -  AST 15 - 41 U/L - - -  ALT 0 - 44 U/L - - -    Imaging: No results found.  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 male with hx of ESRD on MWF HD, HTN, CAD, severe aortic stenosis, spina bifida, chronic opioid use, L knee septic arthritis  who presented with epistaxis, abscess near L thigh fistula, and malodorous discharge from chronic wound due to prior L nephrostomy tube. Now s/p ID of abscess and cleaning of catheter.   ESRD on MWF dialysis Impaired dialysis access, resolved Hyperkalemia Patient scheduled for an exchange of R femoral HD tunnel catheter by vascular surgery today. Patient states he was unaware of the surgery and preferred to try sitting for dialysis to see if it helps with the flow rate. Holding Plavix --OR for HD exchange today --Hold Plavix for today  --HD schedule per Nephro  Spontaneous Ecchymosis Epistaxis - resolved AoC Anemia 2/2 CKD and IDA Thrombocytopenia, resolved Hgb and platelet stable. --Holding plavix today due to surgery. --Check CBC s/p catheter exchange  Chronic pain syndrome Pain is well-controlled on current regimen  --Continue IV dilaudid 2 mg q3h for  breakthrough pain --Continue percocet 1 tab q6h --Continue oxy IR 5 mg q6h  R shoulder pain Due to hemarthrosis. Treating for possible gout.  --Continue prednisone 40 mg daily --Continue pain control  Left Thigh Abscess POD 7 s/p I&D w/ graft removed. Has completed abx treatment. No signs of infection. Site with some bleeding during encounter today. Holding plavix. --Vascular following, appreciate recs --Cultures with no growth   --Continue wound care  Left flank sinus tract CT scan this admission and in Oct 2021 shows no fluid collection or sign of infection. No changes today.  --Continue daily dressing changes --Urology will see in the outpatient  Vitamin D deficiency Metabolic Bone Disease with Secondary Hyperparathryoidism --Continue vit D supplementation --Continue HD  Diet: Renal diet IVF: PO intake VTE: SCDs Start: 01/28/20 0551 Code: Full PT/OT: Home Health with PT ID:  Anti-infectives (From admission, onward)   Start     Dose/Rate Route Frequency Ordered Stop   02/02/20 1200  vancomycin (VANCOCIN) IVPB 1000 mg/200 mL premix  Status:  Discontinued        1,000 mg 200 mL/hr over 60 Minutes Intravenous Every M-W-F (Hemodialysis) 02/01/20 1018 02/01/20 1024   01/30/20 1200  vancomycin (VANCOCIN) IVPB 1000 mg/200 mL premix  Status:  Discontinued        1,000 mg 200 mL/hr over 60 Minutes Intravenous Every M-W-F (Hemodialysis) 01/28/20 2202 02/01/20 1018   01/29/20 1200  vancomycin (VANCOCIN) IVPB 1000 mg/200 mL premix        1,000 mg 200 mL/hr over 60 Minutes Intravenous Every T-Th-Sa (Hemodialysis) 01/28/20 2202 01/31/20 1159   01/29/20 1127  vancomycin (VANCOCIN) 1-5 GM/200ML-% IVPB       Note to Pharmacy: [name]   : cabinet override      01/29/20 1127 01/29/20 2329   01/28/20 1200  vancomycin (VANCOCIN) IVPB 750 mg/150 ml premix  Status:  Discontinued        750 mg 150 mL/hr over 60 Minutes Intravenous Every M-W-F (Hemodialysis) 01/28/20 1119 01/28/20 2202    01/28/20 0900  vancomycin (VANCOCIN) 2,500 mg in sodium chloride 0.9 % 500 mL IVPB        2,500 mg 250 mL/hr over 120 Minutes Intravenous  Once 01/28/20 0828 01/28/20 1221   01/28/20 0400  vancomycin (VANCOCIN) 2,500 mg in sodium chloride 0.9 % 500 mL IVPB  Status:  Discontinued        2,500 mg 250 mL/hr over 120 Minutes Intravenous  Once 01/28/20 0353 01/28/20 1530   01/28/20 0400  ceFEPIme (MAXIPIME) 2 g in sodium chloride 0.9 % 100 mL IVPB        2 g 200 mL/hr over 30  Minutes Intravenous  Once 01/28/20 0353 01/28/20 0646       Anticipated discharge to Home in 2-3 days pending HD access.  Lacinda Axon, MD 02/06/2020, 5:58 AM Pager: [facility]  Please contact the on call pager after 5 pm and on weekends at [phone].

## 2020-02-06 NOTE — Interval H&P Note (Signed)
History and Physical Interval Note:  02/06/2020 12:32 PM  [name]  has presented today for surgery, with the diagnosis of ESRD.  The various methods of treatment have been discussed with the patient and family. After consideration of risks, benefits and other options for treatment, the patient has consented to  Procedure(s): EXCHANGE OF TUNNELED DIALYSIS CATHETER (N/A) as a surgical intervention.  The patient's history has been reviewed, patient examined, no change in status, stable for surgery.  I have reviewed the patient's chart and labs.  Questions were answered to the patient's satisfaction.     Annamarie Major

## 2020-02-06 NOTE — Transfer of Care (Signed)
Immediate Anesthesia Transfer of Care Note  Patient: [name]  Procedure(s) Performed: EXCHANGE OF TUNNELED DIALYSIS CATHETER (Right Thigh)  Patient Location: PACU  Anesthesia Type:General  Level of Consciousness: awake, alert  and oriented  Airway & Oxygen Therapy: Patient Spontanous Breathing and Patient connected to nasal cannula oxygen  Post-op Assessment: Report given to RN, Post -op Vital signs reviewed and stable and Patient moving all extremities X 4  Post vital signs: Reviewed and stable  Last Vitals:  Vitals Value Taken Time  BP 112/85 02/06/20 1540  Temp 36.1 C 02/06/20 1540  Pulse 75 02/06/20 1543  Resp 20 02/06/20 1543  SpO2 100 % 02/06/20 1543  Vitals shown include unvalidated device data.  Last Pain:  Vitals:   02/06/20 0205  TempSrc:   PainSc: Asleep      Patients Stated Pain Goal: 0 [id number] [phone])  Complications: No complications documented.

## 2020-02-06 NOTE — Progress Notes (Signed)
PT Cancellation Note  Patient Details Name: [name] MRN: [id number] DOB: 1965-11-05   Cancelled Treatment:    Reason Eval/Treat Not Completed: Other (comment) (Refused.  "All I am worried about is the surgery at 1 pm."  Refused to do anything with PT.  AGreed he works his UEs and LES.  Will check back Monday.)   Denice Paradise 02/06/2020, 9:52 AM Larhonda Dettloff W,PT Acute Rehabilitation Services Pager:  [phone]  Office:  [phone]

## 2020-02-06 NOTE — Anesthesia Procedure Notes (Signed)
Procedure Name: Intubation Date/Time: 02/06/2020 1:19 PM Performed by: Renato Shin, CRNA Pre-anesthesia Checklist: Patient identified, Emergency Drugs available, Suction available and Patient being monitored Patient Re-evaluated:Patient Re-evaluated prior to induction Oxygen Delivery Method: Circle system utilized Preoxygenation: Pre-oxygenation with 100% oxygen Induction Type: IV induction Ventilation: Mask ventilation without difficulty Laryngoscope Size: Miller and 2 Grade View: Grade I Tube type: Oral Tube size: 7.5 mm Number of attempts: 1 Airway Equipment and Method: Stylet and Oral airway Placement Confirmation: ETT inserted through vocal cords under direct vision,  positive ETCO2 and breath sounds checked- equal and bilateral Secured at: 21 cm Tube secured with: Tape Dental Injury: Teeth and Oropharynx as per pre-operative assessment

## 2020-02-06 NOTE — Op Note (Signed)
Patient name: [name] MRN: [id number] DOB: Mar 28, 1965 Sex: male  02/06/2020 Pre-operative Diagnosis: End-stage renal disease, poorly functioning right femoral dialysis catheter Post-operative diagnosis:  Same Surgeon:  Annamarie Major Assistants: Monica Martinez, MD Procedure:   #1: Redo exposure of right femoral vein   #2: Central venogram   #3: Exchange of 55 cm tunneled femoral catheter using fluoroscopy Anesthesia: General Blood Loss: 300 cc Specimens: None  Findings: There is a kink within the catheter as it enters the pelvis.  I tried to remove this kink however the catheter is already within the vein and so this is not possible.  I performed a central venogram.  The catheter tip is in a large collateral which drains into the central venous system.  I do not believe that this catheter can be manipulated for better positioning.  I could not advance the tip any further into the central venous system.  Indications: The patient has very limited access.  His existing right femoral catheter is not functioning properly.  He comes in today for catheter change.  Procedure:  The patient was identified in the holding area and taken to [facility]  The patient was then placed supine on the table. general anesthesia was administered.  The patient was prepped and draped in the usual sterile fashion.  A time out was called and antibiotics were administered.  An assistant was necessary for this procedure to help with decision-making and challenging exposure.  I began by removing the heparin from the existing catheter.  I placed a Glidewire advantage that went into the suprarenal vena cava.  I then performed contrast injections through the catheter which showed the tip and a collateral which drains into the central venous system.  There were no obvious areas of stenosis.  I then used fluoroscopy to identify where the catheter takes a significant turn likely creating some form of a kink.  This  appeared to be at the top of the femoral head.  I felt that the catheter needed to be exchanged.  I freed up the cuff at the skin exit site.  I then made an oblique incision in the right groin and expose the catheter.  I tried to trace the catheter proximally.  I got up to the point where it entered the vein.  There was a rent made in the vein.  There was a fair amount of blood loss before the vein to be closed primarily.  I did not feel that further attempts at exposing the catheter could help alleviate the kink in the pelvis.  I therefore felt exchange of the catheter was the best option.  I then removed the Glidewire advantage and then transected the catheter at the level of the cuff.  The catheter was then pulled out into the groin incision and the Glidewire advantage was reinserted.  The existing catheter was then removed.  A new skin exit site was selected.  A skin incision was made with an 11 blade.  The catheter was then tunneled from this incision into the groin incision.  The Glidewire advantage was then inserted through the new catheter and the catheter was sent advanced.  I was able to get this to the level of the old catheter but it would not go any further centrally due to lack of pushability.  Both ports flushed and aspirated without difficulty.  They were filled with the appropriate volumes of heparin.  The catheter was then sutured into position with 3-0 nylon  suture.  The groin incision was then closed by reapproximating the deep tissue with 2-0 Vicryl.  The subcutaneous tissue was closed with 3-0 Vicryl followed by subcuticular closure.  Dermabond was applied.  There were no immediate complications.  The patient was successfully extubated and taken to recovery in stable condition.   Disposition: To PACU stable.   Theotis Burrow, M.D., [facility] [place] Office: [phone] Pager:  [phone]

## 2020-02-06 NOTE — Anesthesia Postprocedure Evaluation (Signed)
Anesthesia Post Note  Patient: [name]  Procedure(s) Performed: EXCHANGE OF TUNNELED DIALYSIS CATHETER (Right Thigh)     Patient location during evaluation: PACU Anesthesia Type: General Level of consciousness: awake and alert Pain management: pain level controlled Vital Signs Assessment: post-procedure vital signs reviewed and stable Respiratory status: spontaneous breathing, nonlabored ventilation and respiratory function stable Cardiovascular status: blood pressure returned to baseline and stable Postop Assessment: no apparent nausea or vomiting Anesthetic complications: no   No complications documented.  Last Vitals:  Vitals:   02/06/20 1550 02/06/20 1553  BP:  (!) 103/50  Pulse: 71 69  Resp: 14 12  Temp:    SpO2: 100% 100%    Last Pain:  Vitals:   02/06/20 1550  TempSrc:   PainSc: 5                  Lidia Collum

## 2020-02-06 NOTE — Anesthesia Preprocedure Evaluation (Signed)
Anesthesia Evaluation  Patient identified by MRN, date of birth, ID band Patient awake    Reviewed: Allergy & Precautions, NPO status , Patient's Chart, lab work & pertinent test results  History of Anesthesia Complications Negative for: history of anesthetic complications  Airway Mallampati: II  TM Distance: >3 FB Neck ROM: Full    Dental  (+) Poor Dentition, Dental Advisory Given   Pulmonary neg pulmonary ROS,    Pulmonary exam normal        Cardiovascular hypertension, (-) angina+ CAD and + Peripheral Vascular Disease  + Valvular Problems/Murmurs AS  Rhythm:Regular Rate:Normal + Systolic murmurs  '21 Cath - Mid LAD lesion is 25% stenosed. 1st RPL lesion is 25% stenosed. Prox Cx to Mid Cx lesion is 25% stenosed. LV end diastolic pressure is normal. There is severe aortic valve stenosis. Mean gradient 34 mm Hg. Unable to access left femoral vein.  '21 TTE - EF 65 to 70%. Mild LVH. Grade II diastolic dysfunction (pseudonormalization). LA moderately dilated. Trivial MR. AV is functionally bicuspid. Severe AS, aortic valve mean gradient measures 37.2 mmHg. Aortic valve Vmax measures 3.88 m/s. Dimentionless index 0.25     Neuro/Psych  Headaches, Seizures -, Well Controlled,  PSYCHIATRIC DISORDERS Anxiety  Spina Bifida     GI/Hepatic Neg liver ROS, GERD  Medicated and Controlled,  Endo/Other  Hypothyroidism  Obesity   Renal/GU ESRF and DialysisRenal disease     Musculoskeletal  (+) Arthritis ,   Abdominal   Peds  Hematology  (+) anemia ,  On plavix    Anesthesia Other Findings   Reproductive/Obstetrics                             Anesthesia Physical  Anesthesia Plan  ASA: IV  Anesthesia Plan: General   Post-op Pain Management:    Induction: Intravenous  PONV Risk Score and Plan: 2 and Ondansetron, Midazolam and Treatment may vary due to age or medical  condition  Airway Management Planned: Oral ETT  Additional Equipment:   Intra-op Plan:   Post-operative Plan: Extubation in OR  Informed Consent: I have reviewed the patients History and Physical, chart, labs and discussed the procedure including the risks, benefits and alternatives for the proposed anesthesia with the patient or authorized representative who has indicated his/her understanding and acceptance.     Dental advisory given  Plan Discussed with:   Anesthesia Plan Comments:         Anesthesia Quick Evaluation

## 2020-02-07 ENCOUNTER — Encounter (HOSPITAL_COMMUNITY): Payer: Self-pay | Admitting: Surgery

## 2020-02-07 DIAGNOSIS — T829XXD Unspecified complication of cardiac and vascular prosthetic device, implant and graft, subsequent encounter: Secondary | ICD-10-CM | POA: Diagnosis not present

## 2020-02-07 DIAGNOSIS — N186 End stage renal disease: Secondary | ICD-10-CM | POA: Diagnosis not present

## 2020-02-07 DIAGNOSIS — L02416 Cutaneous abscess of left lower limb: Secondary | ICD-10-CM | POA: Diagnosis not present

## 2020-02-07 LAB — BASIC METABOLIC PANEL
Anion gap: 15 (ref 5–15)
BUN: 48 mg/dL — ABNORMAL HIGH (ref 6–20)
CO2: 23 mmol/L (ref 22–32)
Calcium: 8.4 mg/dL — ABNORMAL LOW (ref 8.9–10.3)
Chloride: 98 mmol/L (ref 98–111)
Creatinine, Ser: 9.87 mg/dL — ABNORMAL HIGH (ref 0.61–1.24)
GFR, Estimated: 6 mL/min — ABNORMAL LOW (ref >60)
Glucose, Bld: 124 mg/dL — ABNORMAL HIGH (ref 70–99)
Potassium: 4 mmol/L (ref 3.5–5.1)
Sodium: 136 mmol/L (ref 135–145)

## 2020-02-07 LAB — HEMOGLOBIN AND HEMATOCRIT, BLOOD
HCT: 27.4 % — ABNORMAL LOW (ref 39.0–52.0)
Hemoglobin: 8.7 g/dL — ABNORMAL LOW (ref 13.0–17.0)

## 2020-02-07 LAB — CBC
HCT: 22.1 % — ABNORMAL LOW (ref 39.0–52.0)
HCT: 26.9 % — ABNORMAL LOW (ref 39.0–52.0)
Hemoglobin: 6.6 g/dL — CL (ref 13.0–17.0)
Hemoglobin: 8 g/dL — ABNORMAL LOW (ref 13.0–17.0)
MCH: 28.4 pg (ref 26.0–34.0)
MCH: 28.3 pg (ref 26.0–34.0)
MCHC: 29.9 g/dL — ABNORMAL LOW (ref 30.0–36.0)
MCHC: 29.7 g/dL — ABNORMAL LOW (ref 30.0–36.0)
MCV: 95.3 fL (ref 80.0–100.0)
MCV: 95.1 fL (ref 80.0–100.0)
Platelets: 172 10*3/uL (ref 150–400)
Platelets: 175 10*3/uL (ref 150–400)
RBC: 2.32 MIL/uL — ABNORMAL LOW (ref 4.22–5.81)
RBC: 2.83 MIL/uL — ABNORMAL LOW (ref 4.22–5.81)
RDW: 16.7 % — ABNORMAL HIGH (ref 11.5–15.5)
RDW: 16.8 % — ABNORMAL HIGH (ref 11.5–15.5)
WBC: 6.3 10*3/uL (ref 4.0–10.5)
WBC: 6.4 10*3/uL (ref 4.0–10.5)
nRBC: 0 % (ref 0.0–0.2)
nRBC: 0 % (ref 0.0–0.2)

## 2020-02-07 LAB — GLUCOSE, CAPILLARY: Glucose-Capillary: 98 mg/dL (ref 70–99)

## 2020-02-07 LAB — PREPARE RBC (CROSSMATCH)

## 2020-02-07 LAB — RENAL FUNCTION PANEL
Albumin: 2.8 g/dL — ABNORMAL LOW (ref 3.5–5.0)
Anion gap: 13 (ref 5–15)
BUN: 79 mg/dL — ABNORMAL HIGH (ref 6–20)
CO2: 25 mmol/L (ref 22–32)
Calcium: 8.4 mg/dL — ABNORMAL LOW (ref 8.9–10.3)
Chloride: 100 mmol/L (ref 98–111)
Creatinine, Ser: 13.95 mg/dL — ABNORMAL HIGH (ref 0.61–1.24)
GFR, Estimated: 4 mL/min — ABNORMAL LOW (ref >60)
Glucose, Bld: 117 mg/dL — ABNORMAL HIGH (ref 70–99)
Phosphorus: 6.3 mg/dL — ABNORMAL HIGH (ref 2.5–4.6)
Potassium: 6.3 mmol/L (ref 3.5–5.1)
Sodium: 138 mmol/L (ref 135–145)

## 2020-02-07 MED ORDER — CALCIUM GLUCONATE-NACL 1-0.675 GM/50ML-% IV SOLN
1.0000 g | Freq: Once | INTRAVENOUS | Status: AC
  Administered 2020-02-07: 1000 mg via INTRAVENOUS
  Filled 2020-02-07: qty 50

## 2020-02-07 MED ORDER — ALTEPLASE 2 MG IJ SOLR
INTRAMUSCULAR | Status: AC
  Administered 2020-02-07: 4 mg
  Filled 2020-02-07: qty 4

## 2020-02-07 MED ORDER — SODIUM ZIRCONIUM CYCLOSILICATE 10 G PO PACK
10.0000 g | PACK | Freq: Once | ORAL | Status: DC
  Filled 2020-02-07: qty 1

## 2020-02-07 MED ORDER — HEPARIN SODIUM (PORCINE) 1000 UNIT/ML IJ SOLN
INTRAMUSCULAR | Status: AC
  Filled 2020-02-07: qty 7

## 2020-02-07 MED ORDER — DOXERCALCIFEROL 4 MCG/2ML IV SOLN
INTRAVENOUS | Status: AC
  Filled 2020-02-07: qty 2

## 2020-02-07 MED ORDER — PROMETHAZINE HCL 25 MG/ML IJ SOLN
6.2500 mg | Freq: Once | INTRAMUSCULAR | Status: AC
  Administered 2020-02-07: 6.25 mg via INTRAVENOUS
  Filled 2020-02-07: qty 1

## 2020-02-07 MED ORDER — CINACALCET HCL 30 MG PO TABS
ORAL_TABLET | ORAL | Status: AC
  Filled 2020-02-07: qty 1

## 2020-02-07 MED ORDER — SODIUM CHLORIDE 0.9% IV SOLUTION: Freq: Once | INTRAVENOUS | Status: AC

## 2020-02-07 MED ORDER — HYDROMORPHONE HCL 1 MG/ML IJ SOLN
1.0000 mg | Freq: Once | INTRAMUSCULAR | Status: AC
  Administered 2020-02-07: 1 mg via INTRAVENOUS
  Filled 2020-02-07: qty 1

## 2020-02-07 NOTE — Progress Notes (Signed)
CRITICAL VALUE ALERT  Critical Value:  K+: 6.3  Date & Time Notied:  02/07/20 0354  Provider Notified: Konrad Penta  Orders Received/Actions taken: awaiting call back

## 2020-02-07 NOTE — Progress Notes (Addendum)
CRITICAL VALUE ALERT  Critical Value:  Hgb: 6.6  Date & Time Notied:  02/07/20 0246  Provider Notified: Konrad Penta  Orders Received/Actions taken: orders received (transfuse 1 unit PRBC's)

## 2020-02-07 NOTE — Progress Notes (Incomplete)
CRITICAL VALUE ALERT  Critical Value:  Potassium 6.3  Date & Time Notied:  3:37 AM 02/07/2020   Provider Notified:   Orders Received/Actions taken:

## 2020-02-07 NOTE — Progress Notes (Signed)
[facility]  Subjective  -seen in dialysis and catheter appears to be working.   Objective (!) 99/48 65 98.8 F (37.1 C) (Oral) 20 100%  Intake/Output Summary (Last 24 hours) at 02/07/2020 0942 Last data filed at 02/07/2020 0640 Gross per 24 hour  Intake 1523.55 ml  Output 500 ml  Net 1023.55 ml    Right femoral tunneled dialysis catheter appears clean dry and intact and groin incision is also clean dry and intact  Laboratory Lab Results: Recent Labs    02/05/20 2237 02/06/20 1226 02/06/20 1502 02/07/20 0203  WBC 4.6  --   --  6.3  HGB 7.7*   < > 7.1* 6.6*  HCT 26.3*   < > 21.0* 22.1*  PLT 183  --   --  172   < > = values in this interval not displayed.   BMET Recent Labs    02/05/20 0301 02/06/20 1226 02/06/20 1502 02/07/20 0203  NA 140   < > 135 138  K 5.8*   < > 6.1* 6.3*  CL 101   < > 100 100  CO2 25  --   --  25  GLUCOSE 78   < > 106* 117*  BUN 77*   < > 81* 79*  CREATININE 14.31*   < > 12.90* 13.95*  CALCIUM 8.7*  --   --  8.4*   < > = values in this interval not displayed.    COAG Lab Results  Component Value Date   INR 1.1 01/28/2020   INR 1.1 12/11/2019   INR 1.0 12/01/2018   No results found for: PTT  Assessment/Planning:  55 year old male with end-stage renal disease with very limited dialysis options moving forward that underwent exchange of his right femoral tunneled dialysis catheter yesterday.  Ultimately catheter is working well this morning and he has flow rates at 350 and discussed with Dr. Myna Hidalgo who also agrees this is working well.  [name] 02/07/2020 9:42 AM --

## 2020-02-07 NOTE — Procedures (Signed)
I was present at this dialysis session. I have reviewed the session itself and made appropriate changes.   Vital signs in last 24 hours:  Temp:  [97 F (36.1 C)-98.8 F (37.1 C)] 98.8 F (37.1 C) (02/05 0638) Pulse Rate:  [60-105] 65 (02/05 0638) Resp:  [12-20] 20 (02/05 0638) BP: (98-112)/(48-85) 99/48 (02/05 0638) SpO2:  [97 %-100 %] 100 % (02/05 0638) Weight change:  Filed Weights   02/03/20 1629 02/05/20 0814 02/05/20 1100  Weight: 122.9 kg 122.7 kg 119.7 kg    Recent Labs  Lab 02/07/20 0203  NA 138  K 6.3*  CL 100  CO2 25  GLUCOSE 117*  BUN 79*  CREATININE 13.95*  CALCIUM 8.4*  PHOS 6.3*    Recent Labs  Lab 02/02/20 0101 02/03/20 0308 02/05/20 0301 02/05/20 2237 02/06/20 1226 02/06/20 1502 02/07/20 0203  WBC 4.1   < > 4.3 4.6  --   --  6.3  NEUTROABS 2.8  --   --   --   --   --   --   HGB 7.3*   < > 7.6* 7.7* 8.5* 7.1* 6.6*  HCT 23.2*   < > 24.8* 26.3* 25.0* 21.0* 22.1*  MCV 93.2   < > 93.6 94.6  --   --  95.3  PLT 131*   < > 159 183  --   --  172   < > = values in this interval not displayed.    Scheduled Meds: . aspirin EC  81 mg Oral Daily  . calcium acetate  2,668 mg Oral TID WC  . Chlorhexidine Gluconate Cloth  6 each Topical Q0600  . cholecalciferol  1,000 Units Oral Daily  . cinacalcet  30 mg Oral Q M,W,F-HD  . clopidogrel  75 mg Oral Daily  . doxercalciferol  4 mcg Intravenous Q M,W,F-HD  . famotidine  40 mg Oral Daily  .  HYDROmorphone (DILAUDID) injection  0.5 mg Intravenous Once  . levothyroxine  137 mcg Oral Q0600  . melatonin  10 mg Oral QHS  . oxyCODONE-acetaminophen  1 tablet Oral Q6H   And  . oxyCODONE  5 mg Oral Q6H  . polyethylene glycol  17 g Oral Daily  . predniSONE  40 mg Oral Q breakfast  . sodium chloride flush  3 mL Intravenous Q12H  . sodium polystyrene  30 g Oral Daily  . sodium zirconium cyclosilicate  10 g Oral Once  . traZODone  100 mg Oral QHS  . zolpidem  10 mg Oral QHS   Continuous Infusions: . sodium  chloride    . sodium chloride Stopped (02/06/20 1700)  . sodium chloride     PRN Meds:.sodium chloride, fluticasone, HYDROmorphone (DILAUDID) injection, promethazine, sodium chloride flush      OP HD:AF MWF 4h 64mn 350/800 118kg 2/2.25 bath Hep 3700 R thigh TDC Sensipar 30 mg q HD Hectorol 4 mcg q HD Mircera 225 mcg IV q 2 weeks- last dose 200 mcg on 1/17, due on 1/31   Assessment/ Plan: 1. SP removal L thigh infected AVG material (old AVG's): on 1/28 yesterday, by [facility]. Appreciated assistance. Cx's pend, on IV vanc 2. L flank sinus tract:from site of previous nephrostomy tube. On antibiotics as above.  3. ESRD:MWF schedule. Last HD was 1/27.HD 1/29 was not successful as catheter could not be accessed. 1. Off schedule due to issues with access.  Will get back on schedule Monday. 4. HD access: pt has been an end-stage access patient for many  years now, f/b [facility] for the most part. [place]. TDC patency over the years has been assisted by regular TPA locks and, per patient, ASA and plavix have helped a lot to keep HD cath from clotting. ASA/ plavix are on hold now due to bruising/ low plts / low Hb. As soon as possible would like to resume ASA +/- Plavix, will help w/ TDC patency.Unfortunately could not run faster than 150 bfr.  1. Consulted Dr. Trula Slade for right fem HD cathetermanipulation which was performed 02/03/20. HD able to be completed but max BFR was only 175. 2. Activase placed andhave to run reversed as arterial port collapses. 3. Went to OR on 02/06/20 for exchange which was complicated by ABLA and transfused 1 unit PRBC. 4. If this catheter does not run well, the only other option would be a transhepatic TDC by IR.  BFR currently 350! 5. Hypertension/volume:BP controlled. EDw raised recently. UF as tol.  6. Anemia:Hgb has been running low since last admission. ESA due 1/31. Pt requesting his full OP dose of  IV heparinw/ inpatient HD (= 3700 units). 7. Metabolic bone disease:Continue sensipar, hectorol and binders. Follow phos/calcium 8. Hyperkalemia- pt normally takes kayexalate at home and will resume to help with hyperkalemia.  Donetta Potts,  MD 02/07/2020, 8:18 AM

## 2020-02-07 NOTE — Progress Notes (Signed)
HD#10 Subjective:   Transfused 1U PRBC overnight for hgb of 6.3. He notes pain this morning but feels like it is fairly well controlled on current pain regimen.   Objective:  Vital signs in last 24 hours: Vitals:   02/06/20 2209 02/07/20 0258 02/07/20 0326 02/07/20 0347  BP: (!) 100/55 (!) 104/56 (!) 108/57 98/60  Pulse: 68 60 (!) 105 62  Resp: '17 18 20 18  '$ Temp: 98.7 F (37.1 C) 98.3 F (36.8 C) 98.4 F (36.9 C) 98.5 F (36.9 C)  TempSrc: Oral Oral Oral Oral  SpO2: 100% 98% 97% 99%  Weight:       Supplemental O2: Room Air SpO2: 99 % O2 Flow Rate (L/min): 2 L/min   Physical Exam:   General: chronically ill appearing Cardiac: extremities warm Pulm: breathing comfortably on RA Skin: saturated dressing on right groin incision. Tender to even light palpation   Filed Weights   02/03/20 1629 02/05/20 0814 02/05/20 1100  Weight: 122.9 kg 122.7 kg 119.7 kg     Intake/Output Summary (Last 24 hours) at 02/07/2020 0509 Last data filed at 02/06/2020 2000 Gross per 24 hour  Intake 1115 ml  Output 500 ml  Net 615 ml   Net IO Since Admission: -6,964.5 mL [02/07/20 0509]  Recent Labs    02/05/20 0746 02/06/20 0744 02/06/20 1149  GLUCAP 74 98 83     Pertinent Labs: CBC Latest Ref Rng & Units 02/07/2020 02/06/2020 02/06/2020  WBC 4.0 - 10.5 K/uL 6.3 - -  Hemoglobin 13.0 - 17.0 g/dL 6.6(LL) 7.1(L) 8.5(L)  Hematocrit 39.0 - 52.0 % 22.1(L) 21.0(L) 25.0(L)  Platelets 150 - 400 K/uL 172 - -    CMP Latest Ref Rng & Units 02/07/2020 02/06/2020 02/06/2020  Glucose 70 - 99 mg/dL 117(H) 106(H) 92  BUN 6 - 20 mg/dL 79(H) 81(H) 81(H)  Creatinine 0.61 - 1.24 mg/dL 13.95(H) 12.90(H) 13.00(H)  Sodium 135 - 145 mmol/L 138 135 138  Potassium 3.5 - 5.1 mmol/L 6.3(HH) 6.1(H) 5.4(H)  Chloride 98 - 111 mmol/L 100 100 99  CO2 22 - 32 mmol/L 25 - -  Calcium 8.9 - 10.3 mg/dL 8.4(L) - -  Total Protein 6.5 - 8.1 g/dL - - -  Total Bilirubin 0.3 - 1.2 mg/dL - - -  Alkaline Phos 38 - 126 U/L - -  -  AST 15 - 41 U/L - - -  ALT 0 - 44 U/L - - -      Assessment/Plan:   Principal Problem:   Abscess of left thigh Active Problems:   End stage renal disease (HCC)   PAD (peripheral artery disease) (HCC)   Nephrostomy complication (HCC)   Complication of vascular access for dialysis   Platelet dysfunction (HCC)   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 male with hx of ESRD on MWF HD, HTN, CAD, severe aortic stenosis, spina bifida, chronic opioid use, L knee septic arthritis who presented with epistaxis, abscess near L thigh fistula, and malodorous discharge from chronic wound due to prior L nephrostomy tube. Now s/p ID of abscess and cleaning of catheter.   ESRD on MWF dialysis  Impaired dialysis access s/p tunneled catheter exchange 2/4 which showed kinking of the HD line around the acetabulum. Flow rate was around 350 this morning at HD. Hyperkalemia. Plan -HD per nephrology -continue wound care -pain management with dilaudid '2mg'$  q3h prn IV, oxycodone '5mg'$  q6h, percocet '5mg'$  q6h -continue plavix  and aspirin for history of HD cath clotting -f/u repeat BMP  Post operative anemia, Anemia of CKD.  -hgb 6.6 this morning. Hgb 8.7 post transfusion. Plan -daily CBC  Chronic pain syndrome. Pain management as above.  Right shoulder hemarthrosis. Questionable gout --Continue prednisone 40 mg daily --pain management as noted above  Left Thigh Abscess s/p I&D with graft removal 1/28. S/p 5d vancomycin. No evidence of acute infection at this time. Continue wound care  High risk for physical deconditioning. PT recommending Home health. Continue PT.  Chronic Left flank sinus tract. Urology recommends nephrectomy which patient is not interested in at this time.  -outpatient f/u with Dr. Tresa Moore  Diet: Renal diet IVF: PO intake VTE: SCDs Start: 01/28/20 0551 Code: Full PT/OT: Home Health with PT  Anticipated discharge to Home  in 2-3 days pending HD access.  Mitzi Hansen, MD Internal Medicine Resident PGY-2 [name] Pager: [phone] 02/07/2020 2:26 PM    Please contact the on call pager after 5 pm and on weekends at [phone].

## 2020-02-07 NOTE — Progress Notes (Addendum)
IMTS Progress Note:  Paged by RN for Hgb 6.6, K+ 6.3. Patient asymptomatic other than increased pain causing inability to sleep overnight despite medications to help with this and also nausea. Patient HDS, having minimal drainage from new tunneled dialysis catheter site.   Assessment / Plan:  # Hyperkalemia: - EKG without obvious peaked T waves - Patient prefers Kayexalate ('30mg'$  daily) to [facility] >> advanced his dose to now - Ordered calcium gluconate 1g IVPB  # Acute on Chronic Anemia  Likely in setting of HD catheter insertion today. - 1U pRBC being given  # Persistent Pain - Will give extra dilaudid '1mg'$  for pain  # Nausea  - Will give dose of Phenergan 6.'25mg'$  now  Patient has single peripheral IV line; 2nd line removed yesterday.  - Will call IV team to obtain 2nd peripheral line to aid with above.  Jeralyn Bennett, MD 02/07/2020, 5:06 AM Pager: [phone]

## 2020-02-08 LAB — TYPE AND SCREEN
ABO/RH(D): A POS
Antibody Screen: NEGATIVE
Unit division: 0
Unit division: 0
Unit division: 0
Unit division: 0
Unit division: 0

## 2020-02-08 LAB — BPAM RBC
Blood Product Expiration Date: 202203012359
Blood Product Expiration Date: 202203012359
Blood Product Expiration Date: 202203022359
Blood Product Expiration Date: 202203022359
Blood Product Expiration Date: 202203022359
ISSUE DATE / TIME: 202202021345
ISSUE DATE / TIME: 202202041500
ISSUE DATE / TIME: 202202041500
ISSUE DATE / TIME: 202202041500
ISSUE DATE / TIME: 202202050325
Unit Type and Rh: 6200
Unit Type and Rh: 6200
Unit Type and Rh: 6200
Unit Type and Rh: 6200
Unit Type and Rh: 6200

## 2020-02-08 LAB — CBC
HCT: 24.5 % — ABNORMAL LOW (ref 39.0–52.0)
Hemoglobin: 7.3 g/dL — ABNORMAL LOW (ref 13.0–17.0)
MCH: 28.5 pg (ref 26.0–34.0)
MCHC: 29.8 g/dL — ABNORMAL LOW (ref 30.0–36.0)
MCV: 95.7 fL (ref 80.0–100.0)
Platelets: 154 10*3/uL (ref 150–400)
RBC: 2.56 MIL/uL — ABNORMAL LOW (ref 4.22–5.81)
RDW: 16.7 % — ABNORMAL HIGH (ref 11.5–15.5)
WBC: 6.3 10*3/uL (ref 4.0–10.5)
nRBC: 0 % (ref 0.0–0.2)

## 2020-02-08 LAB — GLUCOSE, CAPILLARY: Glucose-Capillary: 100 mg/dL — ABNORMAL HIGH (ref 70–99)

## 2020-02-08 LAB — RENAL FUNCTION PANEL
Albumin: 3.1 g/dL — ABNORMAL LOW (ref 3.5–5.0)
Anion gap: 14 (ref 5–15)
BUN: 66 mg/dL — ABNORMAL HIGH (ref 6–20)
CO2: 25 mmol/L (ref 22–32)
Calcium: 8.6 mg/dL — ABNORMAL LOW (ref 8.9–10.3)
Chloride: 100 mmol/L (ref 98–111)
Creatinine, Ser: 10.9 mg/dL — ABNORMAL HIGH (ref 0.61–1.24)
GFR, Estimated: 5 mL/min — ABNORMAL LOW (ref >60)
Glucose, Bld: 112 mg/dL — ABNORMAL HIGH (ref 70–99)
Phosphorus: 4.8 mg/dL — ABNORMAL HIGH (ref 2.5–4.6)
Potassium: 5 mmol/L (ref 3.5–5.1)
Sodium: 139 mmol/L (ref 135–145)

## 2020-02-08 MED ORDER — ONDANSETRON HCL 4 MG/2ML IJ SOLN
4.0000 mg | Freq: Once | INTRAMUSCULAR | Status: AC
  Administered 2020-02-08: 4 mg via INTRAVENOUS
  Filled 2020-02-08: qty 2

## 2020-02-08 MED ORDER — SENNA 8.6 MG PO TABS
2.0000 | ORAL_TABLET | Freq: Every day | ORAL | Status: DC | PRN
  Administered 2020-02-08 – 2020-02-20 (×7): 17.2 mg via ORAL
  Filled 2020-02-08 (×7): qty 2

## 2020-02-08 NOTE — Progress Notes (Signed)
Patient ID: [name], male   DOB: 1965-03-18, 55 y.o.   MRN: [id number] S: Still some soreness in his right groin area O:BP 122/67 (BP Location: Right Arm)   Pulse (!) 57   Temp 98.3 F (36.8 C) (Oral)   Resp 18   Wt 119.7 kg   SpO2 98%   BMI 36.81 kg/m  No intake or output data in the 24 hours ending 02/08/20 1241 Intake/Output: I/O last 3 completed shifts: In: 408.6 [I.V.:30.6; Blood:378] Out: 0   Intake/Output this shift:  No intake/output data recorded. Weight change:  Gen: NAD CVS: Bradycardic at 57 Resp: CTA Abd: +BS, soft, NT/ND Ext: 1+ edema of right leg, trace on left  Recent Labs  Lab 02/02/20 0101 02/03/20 0308 02/04/20 0548 02/05/20 0301 02/06/20 1226 02/06/20 1502 02/07/20 0203 02/07/20 1436 02/08/20 0143  NA 137 138 138 140 138 135 138 136 139  K 5.0 5.4* 5.3* 5.8* 5.4* 6.1* 6.3* 4.0 5.0  CL 95* 97* 100 101 99 100 100 98 100  CO2 '28 27 25 25  '$ --   --  '25 23 25  '$ GLUCOSE 98 109* 94 78 92 106* 117* 124* 112*  BUN 74* 75* 65* 77* 81* 81* 79* 48* 66*  CREATININE 13.43* 13.73* 12.70* 14.31* 13.00* 12.90* 13.95* 9.87* 10.90*  ALBUMIN 2.8* 3.1* 2.7* 2.9*  --   --  2.8*  --  3.1*  CALCIUM 8.3* 8.7* 8.3* 8.7*  --   --  8.4* 8.4* 8.6*  PHOS 6.1* 6.2* 6.4* 6.5*  --   --  6.3*  --  4.8*   Liver Function Tests: Recent Labs  Lab 02/05/20 0301 02/07/20 0203 02/08/20 0143  ALBUMIN 2.9* 2.8* 3.1*   No results for input(s): LIPASE, AMYLASE in the last 168 hours. No results for input(s): AMMONIA in the last 168 hours. CBC: Recent Labs  Lab 02/02/20 0101 02/03/20 0308 02/05/20 0301 02/05/20 2237 02/06/20 1226 02/07/20 0203 02/07/20 1239 02/07/20 1436 02/08/20 0143  WBC 4.1   < > 4.3 4.6  --  6.3  --  6.4 6.3  NEUTROABS 2.8  --   --   --   --   --   --   --   --   HGB 7.3*   < > 7.6* 7.7*   < > 6.6* 8.7* 8.0* 7.3*  HCT 23.2*   < > 24.8* 26.3*   < > 22.1* 27.4* 26.9* 24.5*  MCV 93.2   < > 93.6 94.6  --  95.3  --  95.1 95.7  PLT 131*   < > 159  183  --  172  --  175 154   < > = values in this interval not displayed.   Cardiac Enzymes: No results for input(s): CKTOTAL, CKMB, CKMBINDEX, TROPONINI in the last 168 hours. CBG: Recent Labs  Lab 02/05/20 0746 02/06/20 0744 02/06/20 1149 02/07/20 1303 02/08/20 0808  GLUCAP 74 98 83 98 100*    Iron Studies: No results for input(s): IRON, TIBC, TRANSFERRIN, FERRITIN in the last 72 hours. Studies/Results: HYBRID OR IMAGING ([facility] ONLY)  Result Date: 02/06/2020 There is no interpretation for this exam.  This order is for images obtained during a surgical procedure.  Please See "Surgeries" Tab for more information regarding the procedure.   Marland Kitchen aspirin EC  81 mg Oral Daily  . calcium acetate  2,668 mg Oral TID WC  . Chlorhexidine Gluconate Cloth  6 each Topical Q0600  . cholecalciferol  1,000 Units Oral Daily  .  cinacalcet  30 mg Oral Q M,W,F-HD  . clopidogrel  75 mg Oral Daily  . doxercalciferol  4 mcg Intravenous Q M,W,F-HD  . famotidine  40 mg Oral Daily  .  HYDROmorphone (DILAUDID) injection  0.5 mg Intravenous Once  . levothyroxine  137 mcg Oral Q0600  . melatonin  10 mg Oral QHS  . oxyCODONE-acetaminophen  1 tablet Oral Q6H   And  . oxyCODONE  5 mg Oral Q6H  . polyethylene glycol  17 g Oral Daily  . predniSONE  40 mg Oral Q breakfast  . sodium chloride flush  3 mL Intravenous Q12H  . sodium polystyrene  30 g Oral Daily  . sodium zirconium cyclosilicate  10 g Oral Once  . traZODone  100 mg Oral QHS  . zolpidem  10 mg Oral QHS    BMET    Component Value Date/Time   NA 139 02/08/2020 0143   NA 135 (A) 01/17/2018 0000   K 5.0 02/08/2020 0143   CL 100 02/08/2020 0143   CO2 25 02/08/2020 0143   GLUCOSE 112 (H) 02/08/2020 0143   BUN 66 (H) 02/08/2020 0143   BUN 67 (A) 01/17/2018 0000   CREATININE 10.90 (H) 02/08/2020 0143   CALCIUM 8.6 (L) 02/08/2020 0143   CALCIUM 8.5 (L) 01/31/2020 1157   GFRNONAA 5 (L) 02/08/2020 0143   GFRAA 10 (L) 10/07/2019 0430   CBC     Component Value Date/Time   WBC 6.3 02/08/2020 0143   RBC 2.56 (L) 02/08/2020 0143   HGB 7.3 (L) 02/08/2020 0143   HCT 24.5 (L) 02/08/2020 0143   PLT 154 02/08/2020 0143   MCV 95.7 02/08/2020 0143   MCH 28.5 02/08/2020 0143   MCHC 29.8 (L) 02/08/2020 0143   RDW 16.7 (H) 02/08/2020 0143   LYMPHSABS 0.5 (L) 02/02/2020 0101   MONOABS 0.4 02/02/2020 0101   EOSABS 0.3 02/02/2020 0101   BASOSABS 0.0 02/02/2020 0101     OP HD:AF MWF 4h 35mn 350/800 118kg 2/2.25 bath Hep 3700 R thigh TDC Sensipar 30 mg q HD Hectorol 4 mcg q HD Mircera 225 mcg IV q 2 weeks- last dose 200 mcg on 1/17, due on 1/31   Assessment/ Plan: 1. SP removal L thigh infected AVG material (old AVG's): on 1/28 yesterday, by [facility]. Appreciated assistance. Cx's pend, on IV vanc 2. L flank sinus tract:from site of previous nephrostomy tube. On antibiotics as above.  3. ESRD:MWF schedule. Last HD was 1/27.HD 1/29 was not successful as catheter could not be accessed. 1. Off schedule due to issues with access. Will get back on schedule Monday. 4. HD access: pt has been an end-stage access patient for many years now, f/b [facility] for the most part. [place]. TDC patency over the years has been assisted by regular TPA locks and, per patient, ASA and plavix have helped a lot to keep HD cath from clotting. ASA/ plavix are on hold now due to bruising/ low plts / low Hb. As soon as possible would like to resume ASA +/- Plavix, will help w/ TDC patency.Unfortunately could not run faster than 150 bfr.  1. Consulted Dr. BTrula Sladefor right fem HD cathetermanipulation which was performed 02/03/20. HD able to be completed but max BFR was only 175. 2. Activase placed andhave to run reversed as arterial port collapses. 3. Went to OR on 02/06/20 for exchange which was complicated by ABLA and transfused 1 unit  PRBC. 4. If this catheter does not run well, the only other option  would be a transhepatic TDC by IR. Thankfully had good BFR of 350 5. Plan for HD tomorrow to get back onschedule 5. Hypertension/volume:BP controlled. EDw raised recently. UF as tol.  6. Anemia:Hgb has been running low since last admission. ESA due 1/31. Pt requesting his full OP dose of IV heparinw/ inpatient HD (= 3700 units). 7. Metabolic bone disease:Continue sensipar, hectorol and binders. Follow phos/calcium 8. Hyperkalemia- pt normally takes kayexalate at home and will resume to help with hyperkalemia.   Donetta Potts, MD [employer] [phone]

## 2020-02-08 NOTE — Progress Notes (Signed)
Physical Therapy Treatment Patient Details Name: [name] MRN: [id number] DOB: 08/17/1965 Today's Date: 02/08/2020    History of Present Illness 55 y.o male admitted with left abscess s/p I&D with removal dialysis graft. S/p tunneled catheter exchange 02/06/20 this admission; Hx of End Stage Renal Disease on HD, PAD, nephrostomy complication.    PT Comments    Pt making progress with mobility, ambulating short household distances of ~30 ft x3 bouts with RW and min guard this date. Pt displays an antalgic gait pattern due to R groin pain from s/p tunneled catheter exchange 2/4. He also maintains R ankle plantarflexion throughout with him stating that it is due to his R leg being longer than the L. Will continue to follow acutely to maximize his independence and safety with all functional mobility. Current recommendations remain appropriate. Pt also requested TED hose for lower leg edema management, secure chatted MD for request for orders and notified RN.   Follow Up Recommendations  Home health PT (Continue with HHPT as he states he was making good progress)     Equipment Recommendations  Other (comment) (Requests new reacher)    Recommendations for Other Services       Precautions / Restrictions Precautions Precautions: Fall Precaution Comments: R knee pain (Though did not mention R knee pain during brief session 2/2) Restrictions Weight Bearing Restrictions: No RLE Weight Bearing: Weight bearing as tolerated LLE Weight Bearing: Weight bearing as tolerated    Mobility  Bed Mobility               General bed mobility comments: Pt sitting in recliner upon arrival.  Transfers Overall transfer level: Needs assistance Equipment used: Rolling walker (2 wheeled) Transfers: Sit to/from Stand Sit to Stand: Supervision         General transfer comment: Pt places feet in narrow position with them externally rotated with sit to stand from recliner. Supervision for  safety. Rocking to gain momentum to stand.  Ambulation/Gait Ambulation/Gait assistance: Min guard Gait Distance (Feet): 30 Feet (x3 bouts) Assistive device: Rolling walker (2 wheeled) Gait Pattern/deviations: Step-through pattern;Decreased stride length;Trunk flexed;Wide base of support;Antalgic;Decreased weight shift to right;Decreased step length - left;Decreased dorsiflexion - right Gait velocity: decreased Gait velocity interpretation: <1.8 ft/sec, indicate of risk for recurrent falls General Gait Details: Ambulates with feet externally rotated, R plantarflexion throughout (reports his R leg is longer than L), and with antalgic gait pattern due to R leg pain. Therefore, decreased R stance time and thus decreased L step length. Min guard for safety, but no overt LOB. Relies on RW to unweight leg.   Stairs             Wheelchair Mobility    Modified Rankin (Stroke Patients Only)       Balance             Standing balance-Leahy Scale: Poor Standing balance comment: UE support on RW and min guard                            Cognition Arousal/Alertness: Awake/alert Behavior During Therapy: WFL for tasks assessed/performed Overall Cognitive Status: Within Functional Limits for tasks assessed                                 General Comments: Pt with concerns of safe return home due to still having bleeding and feeling unsafe with mobility.  Exercises      General Comments        Pertinent Vitals/Pain Pain Assessment: 0-10 Pain Score: 9  Pain Location: R groin at sugical site Pain Descriptors / Indicators: Discomfort;Grimacing;Guarding;Operative site guarding Pain Intervention(s): Limited activity within patient's tolerance;Monitored during session;Repositioned    Home Living                      Prior Function            PT Goals (current goals can now be found in the care plan section) Acute Rehab PT Goals Patient  Stated Goal: Get well PT Goal Formulation: With patient Time For Goal Achievement: 02/15/20 Potential to Achieve Goals: Good Progress towards PT goals: Progressing toward goals    Frequency    Min 3X/week      PT Plan Current plan remains appropriate    Co-evaluation              AM-PAC PT "6 Clicks" Mobility   Outcome Measure  Help needed turning from your back to your side while in a flat bed without using bedrails?: None Help needed moving from lying on your back to sitting on the side of a flat bed without using bedrails?: None Help needed moving to and from a bed to a chair (including a wheelchair)?: A Little Help needed standing up from a chair using your arms (e.g., wheelchair or bedside chair)?: None Help needed to walk in hospital room?: A Little Help needed climbing 3-5 steps with a railing? : A Lot 6 Click Score: 20    End of Session Equipment Utilized During Treatment: Gait belt Activity Tolerance: Patient tolerated treatment well Patient left: in chair;with call bell/phone within reach Nurse Communication: Mobility status;Other (comment) (request for TED hose, notified MD to place orders) PT Visit Diagnosis: Other abnormalities of gait and mobility (R26.89);Muscle weakness (generalized) (M62.81);Difficulty in walking, not elsewhere classified (R26.2);Pain;Unsteadiness on feet (R26.81) Pain - Right/Left: Right Pain - part of body: Leg     Time: [id number] PT Time Calculation (min) (ACUTE ONLY): 29 min  Charges:  $Gait Training: 23-37 mins                     Moishe Spice, PT, DPT Acute Rehabilitation Services  Pager: [phone] Office: [place] 02/08/2020, 3:36 PM

## 2020-02-08 NOTE — Progress Notes (Signed)
PT Cancellation Note  Patient Details Name: [name] MRN: [id number] DOB: [birth year]   Cancelled Treatment:    Reason Eval/Treat Not Completed: Other (comment) (pt reports not sleeping well and wanting to rest before PT) Pt requests PT return in afternoon due to fatigue this morning. Will follow-up as able.  Moishe Spice, PT, DPT Acute Rehabilitation Services  Pager: [phone] Office: [place] 02/08/2020, 10:45 AM

## 2020-02-08 NOTE — Progress Notes (Signed)
HD#11 Subjective:  Overnight Events: NAEOV  Patient was assessed at bedside laying in bed comfortably.  Patient states he did not have a good rest because he was unable to get comfortable in bed. Continues to endorse some pain but denies any chest pain, shortness of breath or leg pain. States the earliest she will feel comfortable leaving is probably Wednesday.  Objective:  Vital signs in last 24 hours: Vitals:   02/07/20 0638 02/07/20 1302 02/07/20 2122 02/08/20 0509  BP: (!) 99/48 (!) 100/50 128/69 122/67  Pulse: 65 71 70 (!) 57  Resp: '20 20 18 18  '$ Temp: 98.8 F (37.1 C) 98.9 F (37.2 C) 99.5 F (37.5 C) 98.3 F (36.8 C)  TempSrc: Oral  Oral Oral  SpO2: 100% 100% 100% 98%  Weight:       Supplemental O2: Room Air SpO2: 98 % O2 Flow Rate (L/min): 2 L/min   Physical Exam:   General:  Well-appearing obese male laying in bed.  NAD.  CV: RRR. IV/VII systolic ejection murmur.  Trace LE edema. Pulmonary: Lungs CTAB on anterior chest wall Abdominal: Soft.  NT/ND. Left flank sinus tract with dressing.  Extremities: 2+ distal pulses. Right femoral HD catheter site with dry blood and small blood clots. Skin:  Warm and dry.  Left thigh abscess with dry dressing in place.    Filed Weights   02/03/20 1629 02/05/20 0814 02/05/20 1100  Weight: 122.9 kg 122.7 kg 119.7 kg    No intake or output data in the 24 hours ending 02/08/20 0726 Net IO Since Admission: -6,555.95 mL [02/08/20 0726]  Recent Labs    02/06/20 0744 02/06/20 1149 02/07/20 1303  GLUCAP 98 83 98     Pertinent Labs: CBC Latest Ref Rng & Units 02/08/2020 02/07/2020 02/07/2020  WBC 4.0 - 10.5 K/uL 6.3 6.4 -  Hemoglobin 13.0 - 17.0 g/dL 7.3(L) 8.0(L) 8.7(L)  Hematocrit 39.0 - 52.0 % 24.5(L) 26.9(L) 27.4(L)  Platelets 150 - 400 K/uL 154 175 -    CMP Latest Ref Rng & Units 02/08/2020 02/07/2020 02/07/2020  Glucose 70 - 99 mg/dL 112(H) 124(H) 117(H)  BUN 6 - 20 mg/dL 66(H) 48(H) 79(H)  Creatinine 0.61 - 1.24 mg/dL  10.90(H) 9.87(H) 13.95(H)  Sodium 135 - 145 mmol/L 139 136 138  Potassium 3.5 - 5.1 mmol/L 5.0 4.0 6.3(HH)  Chloride 98 - 111 mmol/L 100 98 100  CO2 22 - 32 mmol/L '25 23 25  '$ Calcium 8.9 - 10.3 mg/dL 8.6(L) 8.4(L) 8.4(L)  Total Protein 6.5 - 8.1 g/dL - - -  Total Bilirubin 0.3 - 1.2 mg/dL - - -  Alkaline Phos 38 - 126 U/L - - -  AST 15 - 41 U/L - - -  ALT 0 - 44 U/L - - -    Imaging: No results found.  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 male with hx of ESRD on MWF HD, HTN, CAD, severe aortic stenosis, spina bifida, chronic opioid use, L knee septic arthritis who presented with epistaxis, abscess near L thigh fistula, and malodorous discharge from chronic wound due to prior L nephrostomy tube. Now s/p ID of abscess and cleaning of  catheter.   ESRD on MWF dialysis Impaired dialysis access, resolved Hyperkalemia, improved.  R femoral HD catheter exchanged on 2/4. Completed dialysis yesterday with great blood flow rate at 350. Will continue HD as scheduled. Hyperkalemia resolved for now.  --Plan for HD tomorrow  --Continue Kayexalate and Lokelma as needed  Spontaneous Ecchymosis Epistaxis - resolved AoC Anemia 2/2 CKD and IDA Thrombocytopenia, resolved Hgb trending down. Down from 8 to 7.3. Some blood around catheter site during encounter today. We will closely monitor. --Continue Plavix and ASA --Transfuse if hemoglobin <7 --Daily CBC  Chronic pain syndrome Pain is relatively well controlled. Will work on transitioning him to oral meds in anticipation for discharge in the next few days. --Continue IV dilaudid 2 mg q3h, percocet 1 tab q6h and oxy IR 5 mg  q6h  R shoulder pain Treating for possible gout with steroids.  --Continue prednisone 40 mg daily.  Left Thigh Abscess POD 9 s/p I&D w/ graft removal. No signs of infection. --Continue wound care  Left flank sinus tract Continue daily dressing changes. --Follow-up with urology in the outpatient  Vitamin D deficiency Metabolic Bone Disease with Secondary Hyperparathryoidism --Continue vit D supplementation --Continue HD  Diet: Renal diet IVF: PO intake VTE: SCDs Start: 01/28/20 0551 Code: Full PT/OT: Home Health with PT ID:  Anti-infectives (From admission, onward)   Start     Dose/Rate Route Frequency Ordered Stop   02/02/20 1200  vancomycin (VANCOCIN) IVPB 1000 mg/200 mL premix  Status:  Discontinued        1,000 mg 200 mL/hr over 60 Minutes Intravenous Every M-W-F (Hemodialysis) 02/01/20 1018 02/01/20 1024   01/30/20 1200  vancomycin (VANCOCIN) IVPB 1000 mg/200 mL premix  Status:  Discontinued        1,000 mg 200 mL/hr over 60 Minutes Intravenous Every M-W-F (Hemodialysis) 01/28/20 2202 02/01/20 1018   01/29/20 1200  vancomycin (VANCOCIN) IVPB 1000 mg/200 mL premix        1,000 mg 200 mL/hr over 60 Minutes Intravenous Every T-Th-Sa (Hemodialysis) 01/28/20 2202 01/31/20 1159   01/29/20 1127  vancomycin (VANCOCIN) 1-5 GM/200ML-% IVPB       Note to Pharmacy: [name]   : cabinet override      01/29/20 1127 01/29/20 2329   01/28/20 1200  vancomycin (VANCOCIN) IVPB 750 mg/150 ml premix  Status:  Discontinued        750 mg 150 mL/hr over 60 Minutes Intravenous Every M-W-F (Hemodialysis) 01/28/20 1119 01/28/20 2202   01/28/20 0900  vancomycin (VANCOCIN) 2,500 mg in sodium chloride 0.9 % 500 mL IVPB        2,500 mg 250 mL/hr over 120 Minutes Intravenous  Once 01/28/20 0828 01/28/20 1221   01/28/20 0400  vancomycin (VANCOCIN) 2,500 mg in sodium chloride 0.9 % 500 mL IVPB  Status:  Discontinued        2,500 mg 250 mL/hr over 120 Minutes Intravenous  Once 01/28/20 0353  01/28/20 1530   01/28/20 0400  ceFEPIme (MAXIPIME) 2 g in sodium chloride 0.9 % 100 mL IVPB        2 g 200 mL/hr over 30 Minutes Intravenous  Once 01/28/20 0353 01/28/20 0646       Anticipated discharge to Home in 1-2 days pending HD access.  Lacinda Axon, MD 02/08/2020, 7:26 AM Pager: [facility]  Please contact the on call pager after 5 pm and on weekends at [phone].

## 2020-02-09 DIAGNOSIS — L02416 Cutaneous abscess of left lower limb: Secondary | ICD-10-CM | POA: Diagnosis not present

## 2020-02-09 DIAGNOSIS — M10312 Gout due to renal impairment, left shoulder: Secondary | ICD-10-CM | POA: Diagnosis not present

## 2020-02-09 DIAGNOSIS — D691 Qualitative platelet defects: Secondary | ICD-10-CM | POA: Diagnosis not present

## 2020-02-09 DIAGNOSIS — T829XXD Unspecified complication of cardiac and vascular prosthetic device, implant and graft, subsequent encounter: Secondary | ICD-10-CM | POA: Diagnosis not present

## 2020-02-09 LAB — CBC
HCT: 22.8 % — ABNORMAL LOW (ref 39.0–52.0)
Hemoglobin: 7.1 g/dL — ABNORMAL LOW (ref 13.0–17.0)
MCH: 29.3 pg (ref 26.0–34.0)
MCHC: 31.1 g/dL (ref 30.0–36.0)
MCV: 94.2 fL (ref 80.0–100.0)
Platelets: 158 10*3/uL (ref 150–400)
RBC: 2.42 MIL/uL — ABNORMAL LOW (ref 4.22–5.81)
RDW: 16.7 % — ABNORMAL HIGH (ref 11.5–15.5)
WBC: 5.7 10*3/uL (ref 4.0–10.5)
nRBC: 0 % (ref 0.0–0.2)

## 2020-02-09 LAB — RENAL FUNCTION PANEL
Albumin: 3 g/dL — ABNORMAL LOW (ref 3.5–5.0)
Anion gap: 16 — ABNORMAL HIGH (ref 5–15)
BUN: 85 mg/dL — ABNORMAL HIGH (ref 6–20)
CO2: 24 mmol/L (ref 22–32)
Calcium: 8.6 mg/dL — ABNORMAL LOW (ref 8.9–10.3)
Chloride: 99 mmol/L (ref 98–111)
Creatinine, Ser: 12.6 mg/dL — ABNORMAL HIGH (ref 0.61–1.24)
GFR, Estimated: 4 mL/min — ABNORMAL LOW (ref >60)
Glucose, Bld: 141 mg/dL — ABNORMAL HIGH (ref 70–99)
Phosphorus: 4.5 mg/dL (ref 2.5–4.6)
Potassium: 4.5 mmol/L (ref 3.5–5.1)
Sodium: 139 mmol/L (ref 135–145)

## 2020-02-09 MED ORDER — DARBEPOETIN ALFA 200 MCG/0.4ML IJ SOSY
PREFILLED_SYRINGE | INTRAMUSCULAR | Status: AC
  Administered 2020-02-09: 200 ug via INTRAVENOUS
  Filled 2020-02-09: qty 0.4

## 2020-02-09 MED ORDER — DOXERCALCIFEROL 4 MCG/2ML IV SOLN
INTRAVENOUS | Status: AC
  Filled 2020-02-09: qty 2

## 2020-02-09 MED ORDER — PROMETHAZINE HCL 25 MG/ML IJ SOLN
INTRAMUSCULAR | Status: AC
  Filled 2020-02-09: qty 1

## 2020-02-09 MED ORDER — PROMETHAZINE HCL 25 MG/ML IJ SOLN
12.5000 mg | Freq: Once | INTRAMUSCULAR | Status: AC
  Administered 2020-02-09: 12.5 mg via INTRAVENOUS

## 2020-02-09 MED ORDER — ALTEPLASE 2 MG IJ SOLR: 4.0000 mg | Freq: Once | INTRAMUSCULAR | Status: AC

## 2020-02-09 MED ORDER — DARBEPOETIN ALFA 200 MCG/0.4ML IJ SOSY: 200.0000 ug | PREFILLED_SYRINGE | Freq: Once | INTRAMUSCULAR | Status: AC

## 2020-02-09 MED ORDER — HYDROMORPHONE HCL 1 MG/ML IJ SOLN
INTRAMUSCULAR | Status: AC
  Filled 2020-02-09: qty 2

## 2020-02-09 MED ORDER — ALTEPLASE 2 MG IJ SOLR
INTRAMUSCULAR | Status: AC
  Administered 2020-02-09: 4 mg
  Filled 2020-02-09: qty 4

## 2020-02-09 NOTE — Progress Notes (Signed)
HD#12 Subjective:  Overnight Events: No acute events overnight  Patient was evaluated and HD today.  States he was unable to sleep much overnight due to pain in his groin.  States his shoulder pain has improved but mostly his pain is coming from a side of his groin.  He denies any chest pain, shortness of breath or palpitations.  States he is happy that his HD catheter is not working well.  States she would like to have a wound VAC placed to help with healing of his left thigh wounds  Objective:  Vital signs in last 24 hours: Vitals:   02/09/20 0700 02/09/20 0715 02/09/20 0745 02/09/20 0815  BP: (!) 150/74 (!) 157/75 120/70 121/65  Pulse: 73 69 (!) 59 61  Resp: (!) 23 14    Temp: 98 F (36.7 C)     TempSrc: Oral     SpO2: 100%     Weight: 125.2 kg      Supplemental O2: Room Air SpO2: 100 % O2 Flow Rate (L/min): 2 L/min   Physical Exam:   General:  Well-appearing obese male laying in dialysis bed. NAD. CV: RRR. IV/VII systolic ejection murmur. Trace LE edema Pulmonary: Lungs CTAb on anterior chest wall.  No wheezing Abdominal:  Soft. NT/ND.  Extremities: 2+ distal pulses. HD catheter running with appropriate flow rate. Skin: Warm and dry  Filed Weights   02/05/20 0814 02/05/20 1100 02/09/20 0700  Weight: 122.7 kg 119.7 kg 125.2 kg    No intake or output data in the 24 hours ending 02/09/20 0829 Net IO Since Admission: -6,555.95 mL [02/09/20 0829]  Recent Labs    02/06/20 1149 02/07/20 1303 02/08/20 0808  GLUCAP 83 98 100*     Pertinent Labs: CBC Latest Ref Rng & Units 02/09/2020 02/08/2020 02/07/2020  WBC 4.0 - 10.5 K/uL 5.7 6.3 6.4  Hemoglobin 13.0 - 17.0 g/dL 7.1(L) 7.3(L) 8.0(L)  Hematocrit 39.0 - 52.0 % 22.8(L) 24.5(L) 26.9(L)  Platelets 150 - 400 K/uL 158 154 175    CMP Latest Ref Rng & Units 02/09/2020 02/08/2020 02/07/2020  Glucose 70 - 99 mg/dL 141(H) 112(H) 124(H)  BUN 6 - 20 mg/dL 85(H) 66(H) 48(H)  Creatinine 0.61 - 1.24 mg/dL 12.60(H) 10.90(H) 9.87(H)   Sodium 135 - 145 mmol/L 139 139 136  Potassium 3.5 - 5.1 mmol/L 4.5 5.0 4.0  Chloride 98 - 111 mmol/L 99 100 98  CO2 22 - 32 mmol/L '24 25 23  '$ Calcium 8.9 - 10.3 mg/dL 8.6(L) 8.6(L) 8.4(L)  Total Protein 6.5 - 8.1 g/dL - - -  Total Bilirubin 0.3 - 1.2 mg/dL - - -  Alkaline Phos 38 - 126 U/L - - -  AST 15 - 41 U/L - - -  ALT 0 - 44 U/L - - -    Imaging: No results found.  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 male with hx of ESRD on MWF HD, HTN, CAD, severe aortic stenosis, spina bifida, chronic opioid use, L knee septic arthritis who presented with epistaxis, abscess near L thigh fistula, and malodorous discharge from chronic wound due to prior L nephrostomy tube.  Now status post I&D  of abscess and exchange of right femoral catheter.  ESRD on MWF dialysis Impaired dialysis access, resolved Hyperkalemia, improved.  R femoral HD catheter exchanged on 2/4.  Patient completed dialysis today with no complications. Continues to have great flow rates. --Continue regular HD schedule --Lokelma and kayexalate for hypercalcemia  Spontaneous Ecchymosis Epistaxis - resolved AoC Anemia 2/2 CKD and IDA Thrombocytopenia, resolved Hemoglobin continues to trend down. Currently at 7.1 but no signs of active bleed.  Patient received max dose darbe 200 ug IV today at HD. Will continue to monitor closely --Transfuse if hemoglobin < 7 --Daily CBC --Continue Plavix and aspirin  Chronic pain syndrome Pain is relatively well controlled. Plan to convert to p.o. meds tomorrow in anticipation for discharge in the next few days. --Continue IV dilaudid 2 mg q3h, percocet 1 tab q6h  and oxy IR 5 mg q6h  R shoulder pain:  Treating for possible gout with steroids.  Patient stated pain has improved since steroids were started. --Continue prednisone 40 mg daily  Left Thigh Abscess POD 10 s/p I&D w/ graft removal. Still no signs of infection. Patient requesting wound VAC placement to home health can come assess him frequently during the week. Plan to discuss this with vascular surgery. --Continue wound care  Left flank sinus tract: No changes.  Follow-up with urology in the outpatient.  Vitamin D deficiency Metabolic Bone Disease Secondary Hyperparathryoidism Continue vitamin D supplementation  Diet: Renal diet IVF: PO intake VTE: Place TED hose Start: 02/08/20 1449 SCDs Start: 01/28/20 0551 Code: Full PT/OT: Home Health with PT ID:  Anti-infectives (From admission, onward)   Start     Dose/Rate Route Frequency Ordered Stop   02/02/20 1200  vancomycin (VANCOCIN) IVPB 1000 mg/200 mL premix  Status:  Discontinued        1,000 mg 200 mL/hr over 60 Minutes Intravenous Every M-W-F (Hemodialysis) 02/01/20 1018 02/01/20 1024   01/30/20 1200  vancomycin (VANCOCIN) IVPB 1000 mg/200 mL premix  Status:  Discontinued        1,000 mg 200 mL/hr over 60 Minutes Intravenous Every M-W-F (Hemodialysis) 01/28/20 2202 02/01/20 1018   01/29/20 1200  vancomycin (VANCOCIN) IVPB 1000 mg/200 mL premix        1,000 mg 200 mL/hr over 60 Minutes Intravenous Every T-Th-Sa (Hemodialysis) 01/28/20 2202 01/31/20 1159   01/29/20 1127  vancomycin (VANCOCIN) 1-5 GM/200ML-% IVPB       Note to Pharmacy: [name]   : cabinet override      01/29/20 1127 01/29/20 2329   01/28/20 1200  vancomycin (VANCOCIN) IVPB 750 mg/150 ml premix  Status:  Discontinued        750 mg 150 mL/hr over 60 Minutes Intravenous Every M-W-F (Hemodialysis) 01/28/20 1119 01/28/20 2202   01/28/20 0900  vancomycin (VANCOCIN) 2,500 mg in sodium chloride 0.9 % 500 mL IVPB        2,500 mg 250 mL/hr over 120 Minutes  Intravenous  Once 01/28/20 0828 01/28/20 1221   01/28/20 0400  vancomycin (VANCOCIN) 2,500 mg in sodium chloride 0.9 % 500 mL IVPB  Status:  Discontinued        2,500 mg 250 mL/hr over 120 Minutes Intravenous  Once 01/28/20 0353 01/28/20 1530   01/28/20 0400  ceFEPIme (MAXIPIME) 2 g in sodium chloride 0.9 % 100 mL IVPB        2 g 200 mL/hr over 30 Minutes Intravenous  Once 01/28/20 0353 01/28/20 0646       Anticipated discharge to Home in 1-2  days pending HD access.  Lacinda Axon, MD 02/09/2020, 8:29 AM Pager: [facility]  Please contact the on call pager after 5 pm and on weekends at [phone].

## 2020-02-09 NOTE — Progress Notes (Signed)
   02/09/20 1600  Clinical Encounter Type  Visited With Patient  Visit Type Spiritual support  Referral From Patient;Nurse  Consult/Referral To Chaplain  Spiritual Encounters  Spiritual Needs Prayer  Chaplain responded to consult for Mr. Graffius.  We had a good visit, and he was very appreciative.  Offered prayer and support.   Chaplain Maytte Jacot Morgan-Simpson [phone]

## 2020-02-09 NOTE — Progress Notes (Addendum)
Progress Note    02/09/2020 1:06 PM 3 Days Post-Op  Subjective: No complaints. HD this morning via right groin TDC without apparent complications.   Vitals:   02/09/20 0930 02/09/20 0946  BP: (!) 96/51 (!) 113/34  Pulse: 72 68  Resp:  20  Temp:  98.2 F (36.8 C)  SpO2:  100%    Physical Exam: General appearance: Awake, alert in no apparent distress Cardiac: Heart rate and rhythm are regular Respirations: Nonlabored Left groin wound has dressing in place dated 2/5. Removed>>  + sour odor, with few granulation buds peripherally      CBC    Component Value Date/Time   WBC 5.7 02/09/2020 0150   RBC 2.42 (L) 02/09/2020 0150   HGB 7.1 (L) 02/09/2020 0150   HCT 22.8 (L) 02/09/2020 0150   PLT 158 02/09/2020 0150   MCV 94.2 02/09/2020 0150   MCH 29.3 02/09/2020 0150   MCHC 31.1 02/09/2020 0150   RDW 16.7 (H) 02/09/2020 0150   LYMPHSABS 0.5 (L) 02/02/2020 0101   MONOABS 0.4 02/02/2020 0101   EOSABS 0.3 02/02/2020 0101   BASOSABS 0.0 02/02/2020 0101    BMET    Component Value Date/Time   NA 139 02/09/2020 0150   NA 135 (A) 01/17/2018 0000   K 4.5 02/09/2020 0150   CL 99 02/09/2020 0150   CO2 24 02/09/2020 0150   GLUCOSE 141 (H) 02/09/2020 0150   BUN 85 (H) 02/09/2020 0150   BUN 67 (A) 01/17/2018 0000   CREATININE 12.60 (H) 02/09/2020 0150   CALCIUM 8.6 (L) 02/09/2020 0150   CALCIUM 8.5 (L) 01/31/2020 1157   GFRNONAA 4 (L) 02/09/2020 0150   GFRAA 10 (L) 10/07/2019 0430     Intake/Output Summary (Last 24 hours) at 02/09/2020 1306 Last data filed at 02/09/2020 0946 Gross per 24 hour  Intake -  Output 4000 ml  Net -4000 ml    HOSPITAL MEDICATIONS Scheduled Meds: . aspirin EC  81 mg Oral Daily  . calcium acetate  2,668 mg Oral TID WC  . Chlorhexidine Gluconate Cloth  6 each Topical Q0600  . cholecalciferol  1,000 Units Oral Daily  . cinacalcet  30 mg Oral Q M,W,F-HD  . clopidogrel  75 mg Oral Daily  . doxercalciferol  4 mcg Intravenous Q M,W,F-HD  .  famotidine  40 mg Oral Daily  .  HYDROmorphone (DILAUDID) injection  0.5 mg Intravenous Once  . levothyroxine  137 mcg Oral Q0600  . melatonin  10 mg Oral QHS  . oxyCODONE-acetaminophen  1 tablet Oral Q6H   And  . oxyCODONE  5 mg Oral Q6H  . predniSONE  40 mg Oral Q breakfast  . sodium chloride flush  3 mL Intravenous Q12H  . sodium polystyrene  30 g Oral Daily  . sodium zirconium cyclosilicate  10 g Oral Once  . traZODone  100 mg Oral QHS  . zolpidem  10 mg Oral QHS   Continuous Infusions: . sodium chloride    . sodium chloride Stopped (02/06/20 1700)  . sodium chloride     PRN Meds:.sodium chloride, fluticasone, HYDROmorphone (DILAUDID) injection, promethazine, senna, sodium chloride flush  Assessment and Plan: 1) s/p L thigh AVG explantation secondary to infection. Intra-op cultures negative. He is afebrile with normal WBC. Needs W>D dressing changed 4 times/day then consider wound VAC placement  2) s/p manipulation of right femoral TDC 2/1 and catheter exchange 2/4. Functioning well.  Risa Grill, PA-C [facility] [phone] 02/09/2020  1:06 PM  Patient discussed with PA.  Encourage twice daily dressing changes to left groin.  New HD catheter working well  Annamarie Major

## 2020-02-09 NOTE — Progress Notes (Addendum)
Patient ID: [name], male   DOB: 12/12/1965, 55 y.o.   MRN: [id number] S: Still some soreness in his right groin area  O:BP 121/65   Pulse 61   Temp 98 F (36.7 C) (Oral)   Resp 14   Wt 125.2 kg   SpO2 100%   BMI 38.50 kg/m  No intake or output data in the 24 hours ending 02/09/20 0834 Intake/Output: No intake/output data recorded.  Intake/Output this shift:  No intake/output data recorded. Weight change:  Gen: NAD CVS: RRR 2/6 sem Resp: CTA Abd: +BS, soft, NT/ND Ext: 1-2+ edema of right leg, trace on left  Recent Labs  Lab 02/03/20 0308 02/04/20 0548 02/05/20 0301 02/06/20 1226 02/06/20 1502 02/07/20 0203 02/07/20 1436 02/08/20 0143 02/09/20 0150  NA 138 138 140 138 135 138 136 139 139  K 5.4* 5.3* 5.8* 5.4* 6.1* 6.3* 4.0 5.0 4.5  CL 97* 100 101 99 100 100 98 100 99  CO2 '27 25 25  '$ --   --  '25 23 25 24  '$ GLUCOSE 109* 94 78 92 106* 117* 124* 112* 141*  BUN 75* 65* 77* 81* 81* 79* 48* 66* 85*  CREATININE 13.73* 12.70* 14.31* 13.00* 12.90* 13.95* 9.87* 10.90* 12.60*  ALBUMIN 3.1* 2.7* 2.9*  --   --  2.8*  --  3.1* 3.0*  CALCIUM 8.7* 8.3* 8.7*  --   --  8.4* 8.4* 8.6* 8.6*  PHOS 6.2* 6.4* 6.5*  --   --  6.3*  --  4.8* 4.5   Liver Function Tests: Recent Labs  Lab 02/07/20 0203 02/08/20 0143 02/09/20 0150  ALBUMIN 2.8* 3.1* 3.0*   No results for input(s): LIPASE, AMYLASE in the last 168 hours. No results for input(s): AMMONIA in the last 168 hours. CBC: Recent Labs  Lab 02/05/20 2237 02/06/20 1226 02/07/20 0203 02/07/20 1239 02/07/20 1436 02/08/20 0143 02/09/20 0150  WBC 4.6  --  6.3  --  6.4 6.3 5.7  HGB 7.7*   < > 6.6*   < > 8.0* 7.3* 7.1*  HCT 26.3*   < > 22.1*   < > 26.9* 24.5* 22.8*  MCV 94.6  --  95.3  --  95.1 95.7 94.2  PLT 183  --  172  --  175 154 158   < > = values in this interval not displayed.   Cardiac Enzymes: No results for input(s): CKTOTAL, CKMB, CKMBINDEX, TROPONINI in the last 168 hours. CBG: Recent Labs  Lab  02/05/20 0746 02/06/20 0744 02/06/20 1149 02/07/20 1303 02/08/20 0808  GLUCAP 74 98 83 98 100*    Iron Studies: No results for input(s): IRON, TIBC, TRANSFERRIN, FERRITIN in the last 72 hours. Studies/Results: No results found. Marland Kitchen aspirin EC  81 mg Oral Daily  . calcium acetate  2,668 mg Oral TID WC  . Chlorhexidine Gluconate Cloth  6 each Topical Q0600  . cholecalciferol  1,000 Units Oral Daily  . cinacalcet  30 mg Oral Q M,W,F-HD  . clopidogrel  75 mg Oral Daily  . doxercalciferol  4 mcg Intravenous Q M,W,F-HD  . famotidine  40 mg Oral Daily  .  HYDROmorphone (DILAUDID) injection  0.5 mg Intravenous Once  . levothyroxine  137 mcg Oral Q0600  . melatonin  10 mg Oral QHS  . oxyCODONE-acetaminophen  1 tablet Oral Q6H   And  . oxyCODONE  5 mg Oral Q6H  . predniSONE  40 mg Oral Q breakfast  . promethazine      . sodium  chloride flush  3 mL Intravenous Q12H  . sodium polystyrene  30 g Oral Daily  . sodium zirconium cyclosilicate  10 g Oral Once  . traZODone  100 mg Oral QHS  . zolpidem  10 mg Oral QHS    BMET    Component Value Date/Time   NA 139 02/09/2020 0150   NA 135 (A) 01/17/2018 0000   K 4.5 02/09/2020 0150   CL 99 02/09/2020 0150   CO2 24 02/09/2020 0150   GLUCOSE 141 (H) 02/09/2020 0150   BUN 85 (H) 02/09/2020 0150   BUN 67 (A) 01/17/2018 0000   CREATININE 12.60 (H) 02/09/2020 0150   CALCIUM 8.6 (L) 02/09/2020 0150   CALCIUM 8.5 (L) 01/31/2020 1157   GFRNONAA 4 (L) 02/09/2020 0150   GFRAA 10 (L) 10/07/2019 0430   CBC    Component Value Date/Time   WBC 5.7 02/09/2020 0150   RBC 2.42 (L) 02/09/2020 0150   HGB 7.1 (L) 02/09/2020 0150   HCT 22.8 (L) 02/09/2020 0150   PLT 158 02/09/2020 0150   MCV 94.2 02/09/2020 0150   MCH 29.3 02/09/2020 0150   MCHC 31.1 02/09/2020 0150   RDW 16.7 (H) 02/09/2020 0150   LYMPHSABS 0.5 (L) 02/02/2020 0101   MONOABS 0.4 02/02/2020 0101   EOSABS 0.3 02/02/2020 0101   BASOSABS 0.0 02/02/2020 0101     OP HD:AF  MWF 4h 49mn 350/800 118kg 2/2.25 bath Hep 3700 R thigh TDC Sensipar 30 mg q HD Hectorol 4 mcg q HD Mircera 225 mcg IV q 2 weeks- last dose 200 mcg on 1/17, due on 1/31   Assessment/ Plan: 1. SP removal L thigh infected AVG material (old AVG's): 1/28 by [facility]. Cx's neg, sp Vanc x 2 days then stopped. Not on abx, wounds healing. Per pmd.  2. L flank sinus tract:from site of previous nephrostomy tube. Bleeding abated 3. ESRD:MWF schedule. HD today 4. HD access: pt has been an end-stage access patient for many years now, f/b [facility] for the most part. [place] 3 [place]. TDC patency over the years has been assisted by regular TPA locks and oral ASA and plavix have helped w/ cath patency per the pt.  ASA/ plavix are back on now. Pt requesting his full OP dose of IV heparin be given at inpatient HD. New cath is functioning well. Due to poor function Dr. BTrula Sladedid a cathetermanipulation 2/1 followed by catheter exchange on 2/4. New cath is working well.  5. Hypertension/volume:BP controlled. Vol is up 5-7kg , +RLE edema, no resp issues. ^Donnamarie Rossettigoal today to 4 L .  6. Anemia:Hgb has been running low since last admission. ESA due 1/31, will give max dose darbe 200 ug IV today 2/7.  7. Metabolic bone disease:Continue sensipar, hectorol and binders. Follow phos/calcium 8. Hyperkalemia- pt normally takes kayexalate at home and will resume to help with hyperkalemia.   RKelly Splinter MD 02/09/2020, 8:43 AM

## 2020-02-10 DIAGNOSIS — D691 Qualitative platelet defects: Secondary | ICD-10-CM | POA: Diagnosis not present

## 2020-02-10 DIAGNOSIS — T829XXD Unspecified complication of cardiac and vascular prosthetic device, implant and graft, subsequent encounter: Secondary | ICD-10-CM | POA: Diagnosis not present

## 2020-02-10 DIAGNOSIS — L02416 Cutaneous abscess of left lower limb: Secondary | ICD-10-CM | POA: Diagnosis not present

## 2020-02-10 DIAGNOSIS — M10312 Gout due to renal impairment, left shoulder: Secondary | ICD-10-CM | POA: Diagnosis not present

## 2020-02-10 LAB — CBC
HCT: 25.2 % — ABNORMAL LOW (ref 39.0–52.0)
Hemoglobin: 7.9 g/dL — ABNORMAL LOW (ref 13.0–17.0)
MCH: 29.8 pg (ref 26.0–34.0)
MCHC: 31.3 g/dL (ref 30.0–36.0)
MCV: 95.1 fL (ref 80.0–100.0)
Platelets: 170 10*3/uL (ref 150–400)
RBC: 2.65 MIL/uL — ABNORMAL LOW (ref 4.22–5.81)
RDW: 16.9 % — ABNORMAL HIGH (ref 11.5–15.5)
WBC: 6.7 10*3/uL (ref 4.0–10.5)
nRBC: 0.3 % — ABNORMAL HIGH (ref 0.0–0.2)

## 2020-02-10 LAB — RENAL FUNCTION PANEL
Albumin: 3.1 g/dL — ABNORMAL LOW (ref 3.5–5.0)
Anion gap: 14 (ref 5–15)
BUN: 73 mg/dL — ABNORMAL HIGH (ref 6–20)
CO2: 26 mmol/L (ref 22–32)
Calcium: 8.7 mg/dL — ABNORMAL LOW (ref 8.9–10.3)
Chloride: 98 mmol/L (ref 98–111)
Creatinine, Ser: 10.89 mg/dL — ABNORMAL HIGH (ref 0.61–1.24)
GFR, Estimated: 5 mL/min — ABNORMAL LOW (ref >60)
Glucose, Bld: 132 mg/dL — ABNORMAL HIGH (ref 70–99)
Phosphorus: 3.9 mg/dL (ref 2.5–4.6)
Potassium: 4.5 mmol/L (ref 3.5–5.1)
Sodium: 138 mmol/L (ref 135–145)

## 2020-02-10 LAB — GLUCOSE, CAPILLARY: Glucose-Capillary: 106 mg/dL — ABNORMAL HIGH (ref 70–99)

## 2020-02-10 MED ORDER — SODIUM CHLORIDE 0.9 % IV SOLN
510.0000 mg | Freq: Once | INTRAVENOUS | Status: AC
  Administered 2020-02-11: 510 mg via INTRAVENOUS
  Filled 2020-02-10: qty 17

## 2020-02-10 MED ORDER — OXYCODONE HCL 5 MG PO TABS
10.0000 mg | ORAL_TABLET | Freq: Four times a day (QID) | ORAL | Status: DC
  Administered 2020-02-10 – 2020-02-21 (×40): 10 mg via ORAL
  Filled 2020-02-10 (×43): qty 2

## 2020-02-10 MED ORDER — OXYCODONE-ACETAMINOPHEN 5-325 MG PO TABS
1.0000 | ORAL_TABLET | Freq: Four times a day (QID) | ORAL | Status: DC
  Administered 2020-02-10 – 2020-02-21 (×39): 1 via ORAL
  Filled 2020-02-10 (×43): qty 1

## 2020-02-10 NOTE — Progress Notes (Signed)
Patient ID: [name], male   DOB: 1965/10/11, 55 y.o.   MRN: [id number] S: Still some soreness in his right groin area  O:BP (!) 133/48   Pulse 68   Temp 98.8 F (37.1 C)   Resp 20   Wt 121 kg   SpO2 99%   BMI 37.21 kg/m   Intake/Output Summary (Last 24 hours) at 02/10/2020 1544 Last data filed at 02/10/2020 0300 Gross per 24 hour  Intake 300 ml  Output --  Net 300 ml   Intake/Output: I/O last 3 completed shifts: In: 300 [P.O.:300] Out: 4000 [Other:4000]  Intake/Output this shift:  No intake/output data recorded. Weight change: -4.2 kg Gen: NAD CVS: RRR 2/6 sem Resp: CTA Abd: +BS, soft, NT/ND Ext: 1-2+ edema of right leg, trace on left  Studies/Results: No results found. Marland Kitchen aspirin EC  81 mg Oral Daily  . calcium acetate  2,668 mg Oral TID WC  . Chlorhexidine Gluconate Cloth  6 each Topical Q0600  . cholecalciferol  1,000 Units Oral Daily  . cinacalcet  30 mg Oral Q M,W,F-HD  . clopidogrel  75 mg Oral Daily  . doxercalciferol  4 mcg Intravenous Q M,W,F-HD  . famotidine  40 mg Oral Daily  .  HYDROmorphone (DILAUDID) injection  0.5 mg Intravenous Once  . levothyroxine  137 mcg Oral Q0600  . melatonin  10 mg Oral QHS  . oxyCODONE  10 mg Oral Q6H   And  . oxyCODONE-acetaminophen  1 tablet Oral Q6H  . predniSONE  40 mg Oral Q breakfast  . sodium chloride flush  3 mL Intravenous Q12H  . sodium polystyrene  30 g Oral Daily  . sodium zirconium cyclosilicate  10 g Oral Once  . traZODone  100 mg Oral QHS  . zolpidem  10 mg Oral QHS   OP HD:AF MWF 4h 82mn 350/800 118kg 2/2.25 bath Hep 3700 R thigh TDC Sensipar 30 mg q HD Hectorol 4 mcg q HD Mircera 225 mcg IV q 2 weeks- last dose 200 mcg on 1/17, due on 1/31   Assessment/ Plan: 1. SP removal L thigh infected AVG material (old AVG's): 1/28 by [facility]. Cx's neg, sp Vanc x 2 days then stopped. Not on abx, wounds healing, not sure wound vac at dc vs wet-to-dry (per pt). Per pmd.  2. L flank sinus  tract:from site of previous nephrostomy tube. Bleeding abated 3. ESRD:MWF schedule. HD tomorrow.  4. HD access: pt has been an end-stage access patient for many years now, f/b [facility] for the most part. Last R fem TDC [place] 3 [place]rs ago. TDC patency over the years has been assisted by regular TPA locks and oral ASA and plavix have helped w/ cath patency per the pt.  ASA/ plavix are back on now. Pt requesting his full OP dose of IV heparin be given at inpatient HD. New cath is functioning well. Due to poor function Dr. BTrula Sladedid a cathetermanipulation 2/1 followed by catheter exchange on 2/4. New cath is working well.  5. Hypertension/volume:BP controlled. Vol is up 5-7kg , +RLE edema, no resp issues. ^Donnamarie Rossettigoal today to 4 L .  6. Anemia:Hgb has been running low since last admission. ESA due 1/31, will gave max dose darbe 200 ug IV 2/7. Needs IV iron per primary team, they will give 1 dose of feraheme '510mg'$ .  7. Metabolic bone disease:Continue sensipar, hectorol and binders. Follow phos/calcium 8. Hyperkalemia- pt normally takes kayexalate at home and will resume  to help with hyperkalemia. 9. Dispo - possible dc soon per pmd   [name], MD 02/10/2020, 3:44 PM

## 2020-02-10 NOTE — Progress Notes (Signed)
HD#13 Subjective:  Overnight Events: No acute events overnight  Patient was assessed at the bedside laying comfortably in chair.  He continues to endorse worsening right shoulder pain with associated numbness in his right deltoid area. He denies any chest pain, shortness of breath or abdominal pain. Reports that dressing changes worsens pain. States he does not feel well will likely need a few days before discharge.   Objective:  Vital signs in last 24 hours: Vitals:   02/09/20 2001 02/10/20 0000 02/10/20 0400 02/10/20 0510  BP: (!) 133/51   (!) 108/55  Pulse: 70   63  Resp: '20 20 11 20  '$ Temp: 99.3 F (37.4 C)   98.2 F (36.8 C)  TempSrc: Oral   Oral  SpO2: 100%   100%  Weight:       Supplemental O2: Room Air SpO2: 100 % O2 Flow Rate (L/min): 2 L/min   Physical Exam:   General:  Well-appearing obese male laying in chair. NAD.  CV: RRR. IV/VII systolic ejection murmur. Trace LE edema. Pulmonary: Lungs CTA BL anterior chest wall. Normal effort. Abdominal:  Soft. NT/ND. Normal BS.  Extremities: 2+ distal pulses. Left thigh wound w/ dry dressing.  Skin: Warm and dry.  Filed Weights   02/05/20 1100 02/09/20 0700 02/09/20 0946  Weight: 119.7 kg 125.2 kg 121 kg     Intake/Output Summary (Last 24 hours) at 02/10/2020 0715 Last data filed at 02/10/2020 0300 Gross per 24 hour  Intake 300 ml  Output 4000 ml  Net -3700 ml   Net IO Since Admission: -10,255.95 mL [02/10/20 0715]  Recent Labs    02/07/20 1303 02/08/20 0808  GLUCAP 98 100*     Pertinent Labs: CBC Latest Ref Rng & Units 02/10/2020 02/09/2020 02/08/2020  WBC 4.0 - 10.5 K/uL 6.7 5.7 6.3  Hemoglobin 13.0 - 17.0 g/dL 7.9(L) 7.1(L) 7.3(L)  Hematocrit 39.0 - 52.0 % 25.2(L) 22.8(L) 24.5(L)  Platelets 150 - 400 K/uL 170 158 154    CMP Latest Ref Rng & Units 02/10/2020 02/09/2020 02/08/2020  Glucose 70 - 99 mg/dL 132(H) 141(H) 112(H)  BUN 6 - 20 mg/dL 73(H) 85(H) 66(H)  Creatinine 0.61 - 1.24 mg/dL 10.89(H) 12.60(H)  10.90(H)  Sodium 135 - 145 mmol/L 138 139 139  Potassium 3.5 - 5.1 mmol/L 4.5 4.5 5.0  Chloride 98 - 111 mmol/L 98 99 100  CO2 22 - 32 mmol/L '26 24 25  '$ Calcium 8.9 - 10.3 mg/dL 8.7(L) 8.6(L) 8.6(L)  Total Protein 6.5 - 8.1 g/dL - - -  Total Bilirubin 0.3 - 1.2 mg/dL - - -  Alkaline Phos 38 - 126 U/L - - -  AST 15 - 41 U/L - - -  ALT 0 - 44 U/L - - -    Imaging: No results found.  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 male with hx of ESRD on MWF HD, HTN, CAD, severe aortic stenosis, spina bifida, chronic opioid use, L knee septic arthritis who presented with epistaxis, abscess near L thigh fistula, and malodorous discharge from chronic wound due to prior L nephrostomy tube.  Now status post I&D of abscess and exchange of right  femoral catheter.  ESRD on MWF dialysis Impaired dialysis access, resolved Hyperkalemia, improved.  R femoral HD catheter exchanged on 2/4. Plan to continue hemodialysis tomorrow. Hypokalemia resolved. --HD tomorrow --Milly Jakob and Kayexalate as needed for hyperkalemia  Spontaneous Ecchymosis Epistaxis - resolved AoC Anemia 2/2 CKD and IDA Thrombocytopenia, resolved Hemoglobin improved to 7.9. Platelets stable at 170. Patient received max dose darbe 200 ug IV at HD yesterday. Continue to monitor closely. Repleting iron with iron transfusion.  --Giving IV feraheme 510 mg x1 dose --Transfuse if hemoglobin <7 --Daily CBCs --Continue on Plavix and aspirin  Chronic pain syndrome Patient endorse worsening right shoulder pain with associated numbness. Plan to transition off IV Dilaudid tomorrow. --Continue IV dilaudid 2 mg q3h, percocet 1  tab q6h and  --Increase OxyIR from 5 mg to 10 mg q6h   R shoulder pain:  Patient endorsed worsening pain with some numbness around the deltoid area. CT shoulder showed possible tear of rotator cuff which is likely causing problems with his axillary nerve. --Continue prednisone 40 mg daily --Increase pain regimen  Left Thigh Abscess POD 10 s/p I&D w/ graft removal. No signs of infection. Vascular increased dressing changes to twice daily wet-to-dry dressing. Patient states dressing changes increases pain. Vascular plan to consider need for wound VAC. Patient also reports increased swelling of the right thigh.  --Place TED hose --Continue wound care  Left flank sinus tract: Continue dry dressing. Follow-up with urology in the outpatient.  Vitamin D deficiency Metabolic Bone Disease Secondary Hyperparathryoidism Continue vitamin D supplementation  Diet: Renal diet IVF: PO intake VTE: Place TED hose Start: 02/08/20 1449 SCDs Start: 01/28/20 0551 Code: Full PT/OT: Home Health with PT ID:  Anti-infectives (From admission, onward)   Start     Dose/Rate Route Frequency Ordered Stop   02/02/20 1200  vancomycin (VANCOCIN) IVPB 1000 mg/200 mL premix  Status:  Discontinued        1,000 mg 200 mL/hr over 60 Minutes Intravenous Every M-W-F (Hemodialysis) 02/01/20 1018 02/01/20 1024   01/30/20 1200  vancomycin (VANCOCIN) IVPB 1000 mg/200 mL premix  Status:  Discontinued        1,000 mg 200 mL/hr over 60 Minutes Intravenous Every M-W-F (Hemodialysis) 01/28/20 2202 02/01/20 1018   01/29/20 1200  vancomycin (VANCOCIN) IVPB 1000 mg/200 mL premix        1,000 mg 200 mL/hr over 60 Minutes Intravenous Every T-Th-Sa (Hemodialysis) 01/28/20 2202 01/31/20 1159   01/29/20 1127  vancomycin (VANCOCIN) 1-5 GM/200ML-% IVPB       Note to Pharmacy: [name]   : cabinet override      01/29/20 1127 01/29/20 2329   01/28/20 1200  vancomycin (VANCOCIN) IVPB 750 mg/150 ml premix  Status:  Discontinued         750 mg 150 mL/hr over 60 Minutes Intravenous Every M-W-F (Hemodialysis) 01/28/20 1119 01/28/20 2202   01/28/20 0900  vancomycin (VANCOCIN) 2,500 mg in sodium chloride 0.9 % 500 mL IVPB        2,500 mg 250 mL/hr over 120 Minutes Intravenous  Once 01/28/20 0828 01/28/20 1221   01/28/20 0400  vancomycin (VANCOCIN) 2,500 mg in sodium chloride 0.9 % 500 mL IVPB  Status:  Discontinued        2,500 mg 250 mL/hr over 120 Minutes Intravenous  Once 01/28/20 0353 01/28/20 1530   01/28/20 0400  ceFEPIme (MAXIPIME) 2 g in sodium chloride 0.9 % 100 mL IVPB        2 g 200 mL/hr over 30  Minutes Intravenous  Once 01/28/20 0353 01/28/20 0646       Anticipated discharge to Home in 1-2 days pending HD access.  Lacinda Axon, MD 02/10/2020, 7:15 AM Pager: [facility]  Please contact the on call pager after 5 pm and on weekends at [phone].

## 2020-02-10 NOTE — Progress Notes (Addendum)
[facility]  Subjective  - Sitting up in bedside chair.   Objective (!) 108/55 63 98.2 F (36.8 C) (Oral) 20 100%  Intake/Output Summary (Last 24 hours) at 02/10/2020 [id number] Last data filed at 02/10/2020 0300 Gross per 24 hour  Intake 300 ml  Output 4000 ml  Net -3700 ml    Left groin dressing in place Right thigh TDC site without erythema or edema Heart RRR Lungs non labored breathing  Assessment/Planning: POD # 2 TDC right thigh exchange Left thigh dressing changed by RN this am.  Will change later this week by VVS for exam.  Patient sitting up in chair this am. Temp WNL,  Wound cultures negative for fungus, abundant WBC no growth to date.  Cont. BID wet to dry dressing changes daily.  Roxy Horseman 02/10/2020 7:13 AM --  Laboratory Lab Results: Recent Labs    02/09/20 0150 02/10/20 0258  WBC 5.7 6.7  HGB 7.1* 7.9*  HCT 22.8* 25.2*  PLT 158 170   BMET Recent Labs    02/09/20 0150 02/10/20 0258  NA 139 138  K 4.5 4.5  CL 99 98  CO2 24 26  GLUCOSE 141* 132*  BUN 85* 73*  CREATININE 12.60* 10.89*  CALCIUM 8.6* 8.7*    COAG Lab Results  Component Value Date   INR 1.1 01/28/2020   INR 1.1 12/11/2019   INR 1.0 12/01/2018   No results found for: PTT  I agree with the above.  I have seen and evaluated the patient.  I told him that he needs to keep the right groin dry with a gauze.  We will try and place a wound VAC on the left groin, however I am concerned about being able to get a good seal.  His new dialysis catheter appears to be functioning properly.  Annamarie Major

## 2020-02-10 NOTE — Progress Notes (Signed)
Physical Therapy Treatment Patient Details Name: [name] MRN: [id number] DOB: October 15, 1965 Today's Date: 02/10/2020    History of Present Illness 55 y.o male admitted with left abscess s/p I&D with removal dialysis graft. S/p tunneled catheter exchange 02/06/20 this admission; Hx of End Stage Renal Disease on HD, PAD, nephrostomy complication.    PT Comments    Pt requiring encouraging words to participate in therapy. Pt requiring increased time to process task and discuss concerns prior to mobility. Pt requiring increased rest following ambulation trials stating 10/10 on modified RPE scale. Pt demonstrating deficits in balance, strength, coordination, ROM, gait, endurance and safety and will benefit from skilled PT to address to maximize independence with functional mobility prior to discharge.     Follow Up Recommendations  Home health PT     Equipment Recommendations  Other (comment) (pt has requested a reacher)    Recommendations for Other Services       Precautions / Restrictions Precautions Precautions: Fall Precaution Comments: R knee pain Restrictions Weight Bearing Restrictions: No    Mobility  Bed Mobility                  Transfers Overall transfer level: Needs assistance Equipment used: Rolling walker (2 wheeled) Transfers: Sit to/from Stand Sit to Stand: Supervision         General transfer comment: Pt places feet in narrow position with them externally rotated with sit to stand from recliner. Supervision for safety. verbal cueing for positioning prior to sitting down to increase safety  Ambulation/Gait Ambulation/Gait assistance: Supervision Gait Distance (Feet): 40 Feet Assistive device: Rolling walker (2 wheeled)   Gait velocity: decreased   General Gait Details: Ambulates with feet externally rotated, B plantarflexion throughout (reports his R leg is longer than L), and with antalgic gait pattern due to R leg pain. Therefore, decreased  R stance time and thus decreased L step length. Min guard for safety, but no overt LOB. Relies on RW to unweight leg.   Stairs             Wheelchair Mobility    Modified Rankin (Stroke Patients Only)       Balance   Sitting-balance support: No upper extremity supported;Feet supported Sitting balance-Leahy Scale: Good     Standing balance support: Bilateral upper extremity supported Standing balance-Leahy Scale: Poor                              Cognition                                              Exercises      General Comments General comments (skin integrity, edema, etc.): pt very emotional with increased concerns regarding d/c home, wound care and if he progressing with therapy. Pt given encouragement to continue to work wtih therapy to continue to progress. Education to discuss concerns with MD regarding medical status and wound care      Pertinent Vitals/Pain Pain Score: 10-Worst pain ever Pain Location: R groin at sugical site Pain Descriptors / Indicators: Discomfort;Grimacing;Guarding;Operative site guarding    Home Living                      Prior Function            PT Goals (current  goals can now be found in the care plan section) Acute Rehab PT Goals Patient Stated Goal: Get well PT Goal Formulation: With patient Time For Goal Achievement: 02/15/20 Potential to Achieve Goals: Good Progress towards PT goals: Progressing toward goals    Frequency    Min 3X/week      PT Plan Current plan remains appropriate    Co-evaluation              AM-PAC PT "6 Clicks" Mobility   Outcome Measure  Help needed turning from your back to your side while in a flat bed without using bedrails?: None Help needed moving from lying on your back to sitting on the side of a flat bed without using bedrails?: None Help needed moving to and from a bed to a chair (including a wheelchair)?: A Little Help needed  standing up from a chair using your arms (e.g., wheelchair or bedside chair)?: A Little   Help needed climbing 3-5 steps with a railing? : A Lot 6 Click Score: 16    End of Session Equipment Utilized During Treatment: Gait belt Activity Tolerance: Patient tolerated treatment well Patient left: in chair;with call bell/phone within reach Nurse Communication: Mobility status PT Visit Diagnosis: Other abnormalities of gait and mobility (R26.89);Muscle weakness (generalized) (M62.81);Difficulty in walking, not elsewhere classified (R26.2);Pain;Unsteadiness on feet (R26.81) Pain - Right/Left: Right Pain - part of body: Leg     Time: [id number] PT Time Calculation (min) (ACUTE ONLY): 24 min  Charges:  $Gait Training: 23-37 mins                     Lyanne Co, DPT Acute Rehabilitation Services [id number]   [place] 02/10/2020, 1:38 PM

## 2020-02-10 NOTE — Plan of Care (Signed)
  Problem: Coping: Goal: Level of anxiety will decrease Outcome: Progressing   Problem: Safety: Goal: Ability to remain free from injury will improve Outcome: Progressing   Problem: Skin Integrity: Goal: Risk for impaired skin integrity will decrease Outcome: Progressing   Problem: Pain Managment: Goal: General experience of comfort will improve Outcome: Not Progressing

## 2020-02-11 DIAGNOSIS — T829XXD Unspecified complication of cardiac and vascular prosthetic device, implant and graft, subsequent encounter: Secondary | ICD-10-CM | POA: Diagnosis not present

## 2020-02-11 DIAGNOSIS — M10312 Gout due to renal impairment, left shoulder: Secondary | ICD-10-CM | POA: Diagnosis not present

## 2020-02-11 DIAGNOSIS — D691 Qualitative platelet defects: Secondary | ICD-10-CM | POA: Diagnosis not present

## 2020-02-11 DIAGNOSIS — L02416 Cutaneous abscess of left lower limb: Secondary | ICD-10-CM | POA: Diagnosis not present

## 2020-02-11 LAB — RENAL FUNCTION PANEL
Albumin: 3.1 g/dL — ABNORMAL LOW (ref 3.5–5.0)
Anion gap: 15 (ref 5–15)
BUN: 86 mg/dL — ABNORMAL HIGH (ref 6–20)
CO2: 26 mmol/L (ref 22–32)
Calcium: 8.8 mg/dL — ABNORMAL LOW (ref 8.9–10.3)
Chloride: 99 mmol/L (ref 98–111)
Creatinine, Ser: 12.67 mg/dL — ABNORMAL HIGH (ref 0.61–1.24)
GFR, Estimated: 4 mL/min — ABNORMAL LOW (ref >60)
Glucose, Bld: 112 mg/dL — ABNORMAL HIGH (ref 70–99)
Phosphorus: 4.4 mg/dL (ref 2.5–4.6)
Potassium: 4.5 mmol/L (ref 3.5–5.1)
Sodium: 140 mmol/L (ref 135–145)

## 2020-02-11 LAB — CBC
HCT: 26.9 % — ABNORMAL LOW (ref 39.0–52.0)
Hemoglobin: 7.9 g/dL — ABNORMAL LOW (ref 13.0–17.0)
MCH: 29 pg (ref 26.0–34.0)
MCHC: 29.4 g/dL — ABNORMAL LOW (ref 30.0–36.0)
MCV: 98.9 fL (ref 80.0–100.0)
Platelets: 185 10*3/uL (ref 150–400)
RBC: 2.72 MIL/uL — ABNORMAL LOW (ref 4.22–5.81)
RDW: 17.1 % — ABNORMAL HIGH (ref 11.5–15.5)
WBC: 7 10*3/uL (ref 4.0–10.5)
nRBC: 0.7 % — ABNORMAL HIGH (ref 0.0–0.2)

## 2020-02-11 MED ORDER — DOXERCALCIFEROL 4 MCG/2ML IV SOLN
INTRAVENOUS | Status: AC
  Filled 2020-02-11: qty 2

## 2020-02-11 MED ORDER — PROMETHAZINE HCL 25 MG/ML IJ SOLN
12.5000 mg | Freq: Once | INTRAMUSCULAR | Status: AC
  Administered 2020-02-11: 12.5 mg via INTRAVENOUS

## 2020-02-11 MED ORDER — DAKINS (1/4 STRENGTH) 0.125 % EX SOLN
Freq: Four times a day (QID) | CUTANEOUS | Status: AC
  Administered 2020-02-11: 1
  Filled 2020-02-11 (×2): qty 473

## 2020-02-11 MED ORDER — PROMETHAZINE HCL 25 MG/ML IJ SOLN
INTRAMUSCULAR | Status: AC
  Filled 2020-02-11: qty 1

## 2020-02-11 MED ORDER — ALTEPLASE 2 MG IJ SOLR
INTRAMUSCULAR | Status: AC
  Administered 2020-02-11: 2 mg
  Filled 2020-02-11: qty 8

## 2020-02-11 NOTE — Progress Notes (Signed)
Patient ID: [name], male   DOB: 07/28/1965, 55 y.o.   MRN: [id number] S: multiple c/o's, no SOB  O:BP (!) 93/46 (BP Location: Right Arm)   Pulse 65   Temp 98.1 F (36.7 C)   Resp 18   Wt 120.3 kg   SpO2 99%   BMI 36.99 kg/m   Intake/Output Summary (Last 24 hours) at 02/11/2020 1132 Last data filed at 02/11/2020 1023 Gross per 24 hour  Intake -  Output 4000 ml  Net -4000 ml   Intake/Output: I/O last 3 completed shifts: In: 300 [P.O.:300] Out: -   Intake/Output this shift:  Total I/O In: -  Out: 4000 [Other:4000] Weight change:  Gen: NAD CVS: RRR 2/6 sem Resp: CTA Abd: +BS, soft, NT/ND Ext: 2+ RLE edema  Studies/Results: No results found. Marland Kitchen aspirin EC  81 mg Oral Daily  . calcium acetate  2,668 mg Oral TID WC  . Chlorhexidine Gluconate Cloth  6 each Topical Q0600  . cholecalciferol  1,000 Units Oral Daily  . cinacalcet  30 mg Oral Q M,W,F-HD  . clopidogrel  75 mg Oral Daily  . doxercalciferol      . doxercalciferol  4 mcg Intravenous Q M,W,F-HD  . famotidine  40 mg Oral Daily  .  HYDROmorphone (DILAUDID) injection  0.5 mg Intravenous Once  . levothyroxine  137 mcg Oral Q0600  . melatonin  10 mg Oral QHS  . oxyCODONE  10 mg Oral Q6H   And  . oxyCODONE-acetaminophen  1 tablet Oral Q6H  . predniSONE  40 mg Oral Q breakfast  . promethazine      . sodium chloride flush  3 mL Intravenous Q12H  . sodium polystyrene  30 g Oral Daily  . sodium zirconium cyclosilicate  10 g Oral Once  . traZODone  100 mg Oral QHS  . zolpidem  10 mg Oral QHS   OP HD:AF MWF 4h 60mn 350/800 118kg 2/2.25 bath Hep 3700 R thigh TDC Sensipar 30 mg q HD Hectorol 4 mcg q HD Mircera 225 mcg IV q 2 weeks- last dose 200 mcg on 1/17, due on 1/31   Assessment/ Plan: 1. SP removal L thigh infected AVG material (old AVG's): 1/28 by VVS. Cx's neg, sp Vanc x 2 days then stopped. Not on abx. Per VVS/ primary team.  2. L flank sinus tract:from site of previous nephrostomy tube.  Bleeding abated 3. ESRD:MWF schedule. HD today.  4. HD access: pt has been an end-stage access patient for many years now, f/b VVS for the most part. [place]. Due to poor function Dr. BTrula Sladedid a cathetermanipulation 2/1 followed by catheter exchange on 2[facility]  5. Hypertension/volume:BP controlled. 4L UF today and is only up 2kg post HD now.  6. Anemia:Hgb has been running low since last admission. ESA due 1/31, will gave max dose darbe 200 ug IV 2/7. Low tsat 16% > to get 1 dose of feraheme '510mg'$  today.  7. Metabolic bone disease:Continue sensipar, hectorol and binders. Follow phos/calcium 8. Hyperkalemia- pt normally takes kayexalate at home on weekends to help w/ high K+ levels 9. Dispo - possible dc soon per pmd   RKelly Splinter MD 02/11/2020, 11:32 AM

## 2020-02-11 NOTE — Progress Notes (Signed)
  Progress Note    02/11/2020 9:59 AM 5 Days Post-Op  Subjective:  Seen in HD. Very concerned about being discharge and how he will handle wound care   Vitals:   02/11/20 0900 02/11/20 0930  BP: (!) 107/56 (!) 91/46  Pulse: 69 79  Resp:    Temp:    SpO2:     Physical Exam: Cardiac: regular Lungs:  Non labored Incisions: right thigh TDC area dressings intact. Some ecchymosis present. Left thigh wound dressed. Appears dressings are somewhat saturated with serous drainage Extremities: well perfused and warm Abdomen:  obese Neurologic: alert and oriented  CBC    Component Value Date/Time   WBC 7.0 02/11/2020 0148   RBC 2.72 (L) 02/11/2020 0148   HGB 7.9 (L) 02/11/2020 0148   HCT 26.9 (L) 02/11/2020 0148   PLT 185 02/11/2020 0148   MCV 98.9 02/11/2020 0148   MCH 29.0 02/11/2020 0148   MCHC 29.4 (L) 02/11/2020 0148   RDW 17.1 (H) 02/11/2020 0148   LYMPHSABS 0.5 (L) 02/02/2020 0101   MONOABS 0.4 02/02/2020 0101   EOSABS 0.3 02/02/2020 0101   BASOSABS 0.0 02/02/2020 0101    BMET    Component Value Date/Time   NA 140 02/11/2020 0148   NA 135 (A) 01/17/2018 0000   K 4.5 02/11/2020 0148   CL 99 02/11/2020 0148   CO2 26 02/11/2020 0148   GLUCOSE 112 (H) 02/11/2020 0148   BUN 86 (H) 02/11/2020 0148   BUN 67 (A) 01/17/2018 0000   CREATININE 12.67 (H) 02/11/2020 0148   CALCIUM 8.8 (L) 02/11/2020 0148   CALCIUM 8.5 (L) 01/31/2020 1157   GFRNONAA 4 (L) 02/11/2020 0148   GFRAA 10 (L) 10/07/2019 0430    INR    Component Value Date/Time   INR 1.1 01/28/2020 0434    No intake or output data in the 24 hours ending 02/11/20 0959   Assessment/Plan:  55 y.o. male is s/p TDC right thigh exchange 5 Days Post-Op. VSS.   Dressings not changed while in HD. Needs dressings changed daily. Will have one of my colleagues change this later today after he is out of HD unit. Possible wound VAC to left groin if good seal can be obtained. Right thigh [place] working well. Patient is  overly concerned about wound care and changes at time of discharge   Karoline Caldwell, [facility] [phone] 02/11/2020 9:59 AM

## 2020-02-11 NOTE — Progress Notes (Addendum)
HD#14 Subjective:  Overnight Events: No acute events overnight. Due for dialysis today.  Patient was assessed at the bedside laying comfortably in bed.  States things are not together enough for him to leave yet. He does not feel comfortably with the twice daily dressing of his left thigh wound. He would like to try the wound vac and he states vascular think he will need 1-2 days for them to re-evaluate if the wound vac will help. States he will be ready to leave by Friday.   Objective:  Vital signs in last 24 hours: Vitals:   02/10/20 0510 02/10/20 1355 02/10/20 1938 02/11/20 0508  BP: (!) 108/55 (!) 133/48 (!) 148/65 (!) 105/48  Pulse: 63 68 73 64  Resp: '20  20 20  '$ Temp: 98.2 F (36.8 C) 98.8 F (37.1 C) 99.4 F (37.4 C) 97.8 F (36.6 C)  TempSrc: Oral  Oral Oral  SpO2: 100% 99% 100% 100%  Weight:       Supplemental O2: Room Air SpO2: 99 % O2 Flow Rate (L/min): 2 L/min   Physical Exam:   General:  Well-appearing obese man laying in bed. No acute distress. CV: RRR. IV/VII systolic ejection murmur. Trace LE edema. Pulmonary: Lungs CTAB on anterior chest wall. Normal effort. Abdominal:  Soft. NT/ND. Normal BS.  Extremities: 2+ distal pulses. Left thigh wound with dry dressing. Mild edema around site of HD catheter. Limited ROM of R shoulder.  Skin: Warm and dry.   Filed Weights   02/05/20 1100 02/09/20 0700 02/09/20 0946  Weight: 119.7 kg 125.2 kg 121 kg    No intake or output data in the 24 hours ending 02/11/20 0741 Net IO Since Admission: -10,255.95 mL [02/11/20 0741]  Recent Labs    02/08/20 0808 02/10/20 0720  GLUCAP 100* 106*     Pertinent Labs: CBC Latest Ref Rng & Units 02/11/2020 02/10/2020 02/09/2020  WBC 4.0 - 10.5 K/uL 7.0 6.7 5.7  Hemoglobin 13.0 - 17.0 g/dL 7.9(L) 7.9(L) 7.1(L)  Hematocrit 39.0 - 52.0 % 26.9(L) 25.2(L) 22.8(L)  Platelets 150 - 400 K/uL 185 170 158    CMP Latest Ref Rng & Units 02/11/2020 02/10/2020 02/09/2020  Glucose 70 - 99 mg/dL  112(H) 132(H) 141(H)  BUN 6 - 20 mg/dL 86(H) 73(H) 85(H)  Creatinine 0.61 - 1.24 mg/dL 12.67(H) 10.89(H) 12.60(H)  Sodium 135 - 145 mmol/L 140 138 139  Potassium 3.5 - 5.1 mmol/L 4.5 4.5 4.5  Chloride 98 - 111 mmol/L 99 98 99  CO2 22 - 32 mmol/L '26 26 24  '$ Calcium 8.9 - 10.3 mg/dL 8.8(L) 8.7(L) 8.6(L)  Total Protein 6.5 - 8.1 g/dL - - -  Total Bilirubin 0.3 - 1.2 mg/dL - - -  Alkaline Phos 38 - 126 U/L - - -  AST 15 - 41 U/L - - -  ALT 0 - 44 U/L - - -    Imaging: No results found.  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 male with hx of ESRD on MWF HD, HTN, CAD, severe aortic stenosis, spina bifida, chronic opioid use, L knee septic arthritis who presented with epistaxis, abscess near L thigh fistula, and malodorous  discharge from chronic wound due to prior L nephrostomy tube.  Now status post I&D of abscess and exchange of right femoral catheter.  ESRD on MWF dialysis R femoral HD catheter exchanged on 2/4. Catheter has been working great.  Potassium stable at 4.5. Patient completed HD today w/o complications.  --Continue HD schedule --Plan for discharge s/p placement of wound Vac.  --Daily RFP --Lokelma and Kayexalate for hyperkalemia  Spontaneous Ecchymosis Epistaxis - resolved AoC Anemia 2/2 CKD and IDA Thrombocytopenia, resolved Hemoglobin stable at 7.9. Platelets trending up currently at 185.  S/p IV iron transfusion yesterday.  --Continue Plavix and aspirin --Daily CBCs  Chronic pain syndrome Patient continues to endorse R shoulder pain and pain in his groin. Patient request to have IV dilaudid wean off tomorrow since he might not leave until  Friday.  --Plan to de-escalate IV Dilaudid 2 mg q3h tomorrow. --Continue OxyIR 10 mg q6h and percocet 1 tab q6h  R shoulder pain:  Pain is better but it is still there. Explained to patient the possible reasons for his shoulder.  --Continue prednisone 40 mg daily for 14 days (Day 7 of 14) --Continue pain control.  Left Thigh Abscess POD 10 s/p I&D w/ graft removal. Still no signs of infection. Currently on BID wet-to-dry dressing. Patient states he wants to try the wound vac because he does not feel he can change his dressings at home. He also endorse swelling in his right thigh. He has not had the compression stockings placed by nurse yet. --Messaged nurse to place TED hose on right leg.  --Continue wound care per vascular     --Considering wound Vac placement  Left flank sinus tract: Continue dry dressing. Follow-up with urology in the outpatient.  Vitamin D deficiency Metabolic Bone Disease Secondary Hyperparathryoidism Continue vitamin D supplementation  Diet: Renal diet IVF: PO intake VTE: Place TED hose Start: 02/08/20 1449 SCDs Start: 01/28/20 0551 Code: Full PT/OT: Home Health with PT ID:  Anti-infectives (From admission, onward)   Start     Dose/Rate Route Frequency Ordered Stop   02/02/20 1200  vancomycin (VANCOCIN) IVPB 1000 mg/200 mL premix  Status:  Discontinued        1,000 mg 200 mL/hr over 60 Minutes Intravenous Every M-W-F (Hemodialysis) 02/01/20 1018 02/01/20 1024   01/30/20 1200  vancomycin (VANCOCIN) IVPB 1000 mg/200 mL premix  Status:  Discontinued        1,000 mg 200 mL/hr over 60 Minutes Intravenous Every M-W-F (Hemodialysis) 01/28/20 2202 02/01/20 1018   01/29/20 1200  vancomycin (VANCOCIN) IVPB 1000 mg/200 mL premix        1,000 mg 200 mL/hr over 60 Minutes Intravenous Every T-Th-Sa (Hemodialysis) 01/28/20 2202 01/31/20 1159   01/29/20 1127  vancomycin (VANCOCIN) 1-5 GM/200ML-% IVPB       Note to Pharmacy: [name]   : cabinet override       01/29/20 1127 01/29/20 2329   01/28/20 1200  vancomycin (VANCOCIN) IVPB 750 mg/150 ml premix  Status:  Discontinued        750 mg 150 mL/hr over 60 Minutes Intravenous Every M-W-F (Hemodialysis) 01/28/20 1119 01/28/20 2202   01/28/20 0900  vancomycin (VANCOCIN) 2,500 mg in sodium chloride 0.9 % 500 mL IVPB        2,500 mg 250 mL/hr over 120 Minutes Intravenous  Once 01/28/20 0828 01/28/20 1221   01/28/20 0400  vancomycin (VANCOCIN) 2,500 mg in sodium chloride 0.9 % 500 mL IVPB  Status:  Discontinued  2,500 mg 250 mL/hr over 120 Minutes Intravenous  Once 01/28/20 0353 01/28/20 1530   01/28/20 0400  ceFEPIme (MAXIPIME) 2 g in sodium chloride 0.9 % 100 mL IVPB        2 g 200 mL/hr over 30 Minutes Intravenous  Once 01/28/20 0353 01/28/20 0646       Anticipated discharge to Home in 1-2 days pending HD access.  Lacinda Axon, MD 02/11/2020, 7:41 AM Pager: [facility]  Please contact the on call pager after 5 pm and on weekends at [phone].

## 2020-02-11 NOTE — Progress Notes (Addendum)
Wound care: AGAIN, the patient's dressing is saturated, smells sour and is staining his gown. He states the dressing was not changed last night. I removed the dressing, irrigated the wound with sterile saline and repacked with damp saline 4x4s and covered with dry 4x4 and abd pad.  Left Groin   I then cleaned his right groin area and incision with sterile saline as there was dried blood in the groin crease and staining the tape around his stoma wafer. I removed all of the old tape, cleaned the skin areas with adhesive remover pads and re-taped the edges of the stoma wafer. His incision is without drainage or signs of infection.  I put in orders for QID dressing changes on Monday. I am aware that the patient is in dialysis every morning and dressing changes cannot occur then.  His wound cannot heal without consistent dressing changes. I will change to [place] wet to dry to enhance debridement and to decrease colonization of skin bacteria. If he can have several days of consistent dressing changes, then he should be optimized for wound vac placement.

## 2020-02-12 DIAGNOSIS — M109 Gout, unspecified: Secondary | ICD-10-CM

## 2020-02-12 DIAGNOSIS — I739 Peripheral vascular disease, unspecified: Secondary | ICD-10-CM

## 2020-02-12 LAB — RENAL FUNCTION PANEL
Albumin: 3 g/dL — ABNORMAL LOW (ref 3.5–5.0)
Anion gap: 14 (ref 5–15)
BUN: 77 mg/dL — ABNORMAL HIGH (ref 6–20)
CO2: 26 mmol/L (ref 22–32)
Calcium: 8.6 mg/dL — ABNORMAL LOW (ref 8.9–10.3)
Chloride: 97 mmol/L — ABNORMAL LOW (ref 98–111)
Creatinine, Ser: 11.33 mg/dL — ABNORMAL HIGH (ref 0.61–1.24)
GFR, Estimated: 5 mL/min — ABNORMAL LOW (ref >60)
Glucose, Bld: 124 mg/dL — ABNORMAL HIGH (ref 70–99)
Phosphorus: 3.7 mg/dL (ref 2.5–4.6)
Potassium: 4.4 mmol/L (ref 3.5–5.1)
Sodium: 137 mmol/L (ref 135–145)

## 2020-02-12 LAB — GLUCOSE, CAPILLARY: Glucose-Capillary: 102 mg/dL — ABNORMAL HIGH (ref 70–99)

## 2020-02-12 MED ORDER — HYDROMORPHONE HCL 1 MG/ML IJ SOLN
2.0000 mg | INTRAMUSCULAR | Status: DC | PRN
  Administered 2020-02-12 – 2020-02-17 (×21): 2 mg via INTRAVENOUS
  Filled 2020-02-12 (×21): qty 2

## 2020-02-12 MED ORDER — FENTANYL 75 MCG/HR TD PT72
1.0000 | MEDICATED_PATCH | TRANSDERMAL | Status: DC
  Administered 2020-02-12: 1 via TRANSDERMAL
  Filled 2020-02-12: qty 1

## 2020-02-12 MED ORDER — PROMETHAZINE HCL 25 MG/ML IJ SOLN
6.2500 mg | INTRAMUSCULAR | Status: DC | PRN
  Administered 2020-02-12 – 2020-02-21 (×36): 6.25 mg via INTRAVENOUS
  Filled 2020-02-12 (×44): qty 1

## 2020-02-12 MED ORDER — SODIUM POLYSTYRENE SULFONATE PO POWD
15.0000 g | ORAL | Status: DC
  Administered 2020-02-14 – 2020-02-15 (×2): 15 g via ORAL
  Filled 2020-02-12 (×3): qty 15

## 2020-02-12 MED ORDER — SODIUM POLYSTYRENE SULFONATE PO POWD: 30.0000 g | ORAL | Status: DC

## 2020-02-12 NOTE — Progress Notes (Signed)
Patient seen and examined.  The right groin incision appears to be healing although this needs to be kept dry.  He is currently scheduled to get his left groin dressing change later today.  Yesterday, this had a foul-smelling odor and I did not feel that it was ready for wound VAC.  I think he is going to need wet-to-dry dressing changes through the weekend and then we can try to get a wound VAC on Monday for discharge.  Annamarie Major

## 2020-02-12 NOTE — TOC Initial Note (Signed)
Transition of Care [facility]) - Initial/Assessment Note    Patient Details  Name: [name] MRN: [id number] Date of Birth: 1965-01-31  Transition of Care [facility]) CM/SW Contact:    Pollie Friar, RN Phone Number: 02/12/2020, 1:03 PM  Clinical Narrative:                 Patient lives at home with his sister. CM met with the patient and he states his sister is out of town until Monday.  Currently pt is requiring QID dressing changes that he doesn't feel he can manage. He also wont have the assistance of his sister over the weekend.  Notes indicate that he may require a wound vac prior to d/c.  Pt is already active with [facility] services. They would need new [place] orders prior to d/c. TOC following for [place] orders and DME needs including possible wound vac.  Expected Discharge Plan: [place] Barriers to Discharge: Continued Medical Work up   Patient Goals and CMS Choice   CMS Medicare.gov Compare Post Acute Care list provided to:: Patient Choice offered to / list presented to : Patient  Expected Discharge Plan and Services Expected Discharge Plan: [place]   Discharge Planning Services: CM Consult Post Acute Care Choice: Home Health,Durable Medical Equipment Living arrangements for the past 2 months: [place] Agency: [facility]        Prior Living Arrangements/Services Living arrangements for the past 2 months: Single Family Home Lives with:: Siblings Patient language and need for interpreter reviewed:: Yes Do you feel safe going back to the place where you live?: Yes      Need for Family Participation in Patient Care: Yes (Comment) Care giver support system in place?: Yes (comment) (sister is out of town for the weekend)   Criminal Activity/Legal Involvement Pertinent to Current Situation/Hospitalization: No - Comment as needed  Activities of Daily Living Home Assistive  Devices/Equipment: Administrator, Civil Service (specify type),Hospital bed (front wheel walker) ADL Screening (condition at time of admission) Patient's cognitive ability adequate to safely complete daily activities?: Yes Is the patient deaf or have difficulty hearing?: Yes (R ear) Does the patient have difficulty seeing, even when wearing glasses/contacts?: No Does the patient have difficulty concentrating, remembering, or making decisions?: Yes (short term memory loss) Patient able to express need for assistance with ADLs?: Yes Does the patient have difficulty dressing or bathing?: Yes Independently performs ADLs?: Yes (appropriate for developmental age) Does the patient have difficulty walking or climbing stairs?: Yes Weakness of Legs: Both Weakness of Arms/Hands: None  Permission Sought/Granted                  Emotional Assessment Appearance:: Appears stated age Attitude/Demeanor/Rapport: Engaged Affect (typically observed): Accepting Orientation: : Oriented to Self,Oriented to Place,Oriented to  Time,Oriented to Situation   Psych Involvement: No (comment)  Admission diagnosis:  Abnormal bruising [R23.8] Platelet dysfunction (HCC) [I95.1] Complication of nephrostomy ([place]) [N99.528] Abscess of left thigh [L02.416] Acute anterior epistaxis [R04.0] Patient Active Problem List   Diagnosis Date Noted  . 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 Platelet dysfunction (HCC)   . Abscess of left thigh 01/28/2020  . Acute gout of right knee 12/10/2019  . Chest pressure   . Coronary artery calcification   .  Chest pain   . Sepsis without acute organ dysfunction ([place])   . Vascular graft infection ([place])   . Wound infection   . Pyogenic arthritis of left knee joint ([place])   . Pain and swelling of knee, left   . Complication of vascular access for dialysis   . Hyperkalemia   . Dysphagia   . Ureteral stone with hydronephrosis 03/26/2019  . Malfunction of nephrostomy  tube ([place])   . Nephrostomy complication ([place]) 48/88/9169  . Abnormal CT scan, gastrointestinal tract   . Flank pain 11/26/2018  . Nephrolithiasis 11/26/2018  . Hydronephrosis with renal and ureteral calculus obstruction 11/26/2018  . Left flank pain 11/26/2018  . ESRD on dialysis ([place]) 06/12/2018  . PAD (peripheral artery disease) ([place]) 12/22/2017  . Problem with dialysis access ([place]) 12/21/2017  . Anemia 12/21/2017  . Hypothyroidism 12/21/2017  . GERD (gastroesophageal reflux disease) 12/21/2017  . Anxiety 12/21/2017  . Left great toe amputee ([place]) 12/19/2017  . Endocarditis of mitral valve   . 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 MSSA bacteremia 12/08/2017  . Fever   . Right flank pain   . ESRD (end stage renal disease) ([place]) 09/20/2017  . ESRD (end stage renal disease) on dialysis ([place]) 08/07/2017  . Nonhealing surgical wound 02/29/2016  . Clotted dialysis access ([place]) 01/14/2016  . Removal of staples 07/19/2012  . Complication of AV dialysis fistula 06/27/2012  . End stage renal disease ([place]) 06/27/2012   PCP:  [name], MD Pharmacy:   [id number] [place] [id number]: [phone]: [phone]    Social Determinants of Health (SDOH) Interventions    Readmission Risk Interventions No flowsheet data found.

## 2020-02-12 NOTE — Progress Notes (Signed)
Patient ID: [name], male   DOB: 04-Oct-1965, 55 y.o.   MRN: [id number] S: no c/o  O:BP 104/70 (BP Location: Right Wrist)   Pulse 67   Temp 99.2 F (37.3 C)   Resp 18   Wt 125.6 kg   SpO2 100%   BMI 38.62 kg/m   Intake/Output Summary (Last 24 hours) at 02/12/2020 1555 Last data filed at 02/12/2020 1412 Gross per 24 hour  Intake -  Output 50 ml  Net -50 ml   Intake/Output: I/O last 3 completed shifts: In: -  Out: 4000 [Other:4000]  Intake/Output this shift:  Total I/O In: -  Out: 50 [Urine:50] Weight change:  Gen: NAD CVS: RRR 2/6 sem Resp: CTA Abd: +BS, soft, NT/ND Ext: 1+ RLE edema  Studies/Results: No results found. Marland Kitchen aspirin EC  81 mg Oral Daily  . calcium acetate  2,668 mg Oral TID WC  . Chlorhexidine Gluconate Cloth  6 each Topical Q0600  . cholecalciferol  1,000 Units Oral Daily  . cinacalcet  30 mg Oral Q M,W,F-HD  . clopidogrel  75 mg Oral Daily  . doxercalciferol  4 mcg Intravenous Q M,W,F-HD  . famotidine  40 mg Oral Daily  . fentaNYL  1 patch Transdermal Q72H  . levothyroxine  137 mcg Oral Q0600  . melatonin  10 mg Oral QHS  . oxyCODONE  10 mg Oral Q6H   And  . oxyCODONE-acetaminophen  1 tablet Oral Q6H  . predniSONE  40 mg Oral Q breakfast  . sodium chloride flush  3 mL Intravenous Q12H  . sodium hypochlorite   Irrigation QID  . [START ON 02/14/2020] sodium polystyrene  15 g Oral Once per day on Sun Sat  . traZODone  100 mg Oral QHS  . zolpidem  10 mg Oral QHS   OP HD:AF MWF 4h 100mn 350/800 118kg 2/2.25 bath Hep 3700 R thigh TDC Sensipar 30 mg q HD Hectorol 4 mcg q HD Mircera 225 mcg IV q 2 weeks- last dose 200 mcg on 1/17, due on 1/31   Assessment/ Plan: 1. SP removal L thigh infected AVG material (old AVG's): 1/28 by VVS. Cx's neg, sp Vanc x 2 days then stopped. Not on abx. Per VVS/ primary team.  2. L flank sinus tract:from site of previous nephrostomy tube. Bleeding abated 3. ESRD:MWF schedule. HD Friday.   4. HD  access: pt has been an end-stage access patient for many years now, f/b [facility] for the most part. [place]. Due to poor function Dr. BTrula Sladedid a cathetermanipulation 2/1 followed by catheter exchange on 2/4[facility]  5. Hypertension/volume:BP controlled. Not sure wt's are correct (up 7kg). Lungs clear. HD tomorrow max UF.  6. Anemia:Hgb has been running low since last admission. ESA due 1/31, sp max dose darbe 200 ug IV 2/7. Low tsat 16% > sp feraheme '510mg'$  [id number]  7. Metabolic bone disease:Continue sensipar, hectorol and binders. Follow phos/calcium 8. Hyperkalemia- pt normally takes kayexalate at home on weekends to help w/ high K+ levels   RKelly Splinter MD 02/12/2020, 3:55 PM

## 2020-02-12 NOTE — Progress Notes (Signed)
HD#15 Subjective:  Overnight Events: No acute events overnight.  Patient states he is doing better today. He continues to have increased pain with dressing changes. He wants to get his wound care figured out before discharge. He also states he was prescribed a fentanyl patch by his PCP that he never picked up so he would like to be on it so the IV dilaudid can be used with just the dressing changes. Compression stocking was placed on right leg today.   Objective:  Vital signs in last 24 hours: Vitals:   02/11/20 1613 02/11/20 1947 02/12/20 0503 02/12/20 0740  BP: (!) 107/92 (!) 116/35 (!) 104/38   Pulse: 72 84 76   Resp: 20 (!) 22 20   Temp: 99.7 F (37.6 C) 99.1 F (37.3 C) 97.8 F (36.6 C)   TempSrc:  Oral Oral   SpO2: 100% 100% 100%   Weight:    125.6 kg   Supplemental O2: Room Air SpO2: 100 % O2 Flow Rate (L/min): 2 L/min   Physical Exam:   General:  Well-appearing obese man laying in bed. NAD. CV: RRR. IV/VI systolic ejection murmur. Trace LE edema. Pulmonary: Lung CTAB on anterior chest wall. Normal effort.  Abdominal: Soft. NT/ND. Normal BS. Extremities: @+ distal pulses. Left thigh wound w/ dry dressing, no signs of infection. R femoral catheter in place with no drainage or blood. Skin: Warm and dry. Neuro: A&Ox3. No focal deficits.  Filed Weights   02/11/20 0748 02/11/20 1023 02/12/20 0740  Weight: 124.3 kg 120.3 kg 125.6 kg     Intake/Output Summary (Last 24 hours) at 02/12/2020 [id number] Last data filed at 02/11/2020 1023 Gross per 24 hour  Intake --  Output 4000 ml  Net -4000 ml   Net IO Since Admission: -14,255.95 mL [02/12/20 0822]  Recent Labs    02/10/20 0720 02/12/20 0735  GLUCAP 106* 102*     Pertinent Labs: CBC Latest Ref Rng & Units 02/11/2020 02/10/2020 02/09/2020  WBC 4.0 - 10.5 K/uL 7.0 6.7 5.7  Hemoglobin 13.0 - 17.0 g/dL 7.9(L) 7.9(L) 7.1(L)  Hematocrit 39.0 - 52.0 % 26.9(L) 25.2(L) 22.8(L)  Platelets 150 - 400 K/uL 185 170 158    CMP  Latest Ref Rng & Units 02/12/2020 02/11/2020 02/10/2020  Glucose 70 - 99 mg/dL 124(H) 112(H) 132(H)  BUN 6 - 20 mg/dL 77(H) 86(H) 73(H)  Creatinine 0.61 - 1.24 mg/dL 11.33(H) 12.67(H) 10.89(H)  Sodium 135 - 145 mmol/L 137 140 138  Potassium 3.5 - 5.1 mmol/L 4.4 4.5 4.5  Chloride 98 - 111 mmol/L 97(L) 99 98  CO2 22 - 32 mmol/L '26 26 26  '$ Calcium 8.9 - 10.3 mg/dL 8.6(L) 8.8(L) 8.7(L)  Total Protein 6.5 - 8.1 g/dL - - -  Total Bilirubin 0.3 - 1.2 mg/dL - - -  Alkaline Phos 38 - 126 U/L - - -  AST 15 - 41 U/L - - -  ALT 0 - 44 U/L - - -    Imaging: No results found.  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 male with hx of ESRD on MWF HD, HTN, CAD, severe aortic stenosis, spina bifida, chronic opioid use, L  knee septic arthritis who presented with epistaxis, abscess near L thigh fistula, and malodorous discharge from chronic wound due to prior L nephrostomy tube.  Now status post I&D of abscess and exchange of right femoral catheter. Pending discharge to home with Home Health.   Left Thigh Abscess S/p removal of L thigh infected AVG and I&D of abscess on 1/28. Completed abx treatment. Currently working on wound care, wet-to-dry dressings. No signs of infection. Plan to place wound VAC tomorrow or over the weekend. Patient ready for discharge when cleared by vascular.  --Vascular following, appreciate recs.  --Changed dressing changes to four times daily yesterday --Plan for wound VAC placment --Ted hose place on R leg today for some leg swelling.   ESRD on MWF dialysis R femoral HD catheter exchanged on 2/4. Catheter working well. Due to dialysis tomorrow. K+ stable at  4.4.  --Continue regular HD schedule --Daily RFP --Continue daily Kayexalate (Sat, Sun)  Spontaneous Ecchymosis Epistaxis - resolved AoC Anemia 2/2 CKD and IDA Thrombocytopenia, resolved Hemoglobin stable at 7.9 and platelets trending up. S/p IV iron transfusion on 2/8. No signs of active bleeds. Continue to watch closely. --Daily CBC --Continue Plavix and Aspirin  Chronic pain syndrome R shoulder pain Continue to endorse should pain that is better and groin pain that is worse when his dressings are changed. Plan to start fentanyl patch today and start transition off IV dilaudid --Start fentanyl patch 75 mcg/hr  --IV dilaudid 2 mg prn for dressing changes --Continue OxyIR 10 mg q6h and percocet 1 tab q6h --Continue prednisone 40 mg daily (Day 8 of 14) --IV phenergan 6.25 mg q4h  Left flank sinus tract: Continue would care. Follow up with outpatient urology. Vitamin D deficiency Metabolic Bone Disease Secondary Hyperparathryoidism Continue vitamin D supplementation  Diet: Renal diet IVF: PO intake VTE: Place TED hose Start: 02/08/20 1449 SCDs Start: 01/28/20 0551 Code: Full PT/OT: Home Health with PT ID:  Anti-infectives (From admission, onward)   Start     Dose/Rate Route Frequency Ordered Stop   02/02/20 1200  vancomycin (VANCOCIN) IVPB 1000 mg/200 mL premix  Status:  Discontinued        1,000 mg 200 mL/hr over 60 Minutes Intravenous Every M-W-F (Hemodialysis) 02/01/20 1018 02/01/20 1024   01/30/20 1200  vancomycin (VANCOCIN) IVPB 1000 mg/200 mL premix  Status:  Discontinued        1,000 mg 200 mL/hr over 60 Minutes Intravenous Every M-W-F (Hemodialysis) 01/28/20 2202 02/01/20 1018   01/29/20 1200  vancomycin (VANCOCIN) IVPB 1000 mg/200 mL premix        1,000 mg 200 mL/hr over 60 Minutes Intravenous Every T-Th-Sa (Hemodialysis) 01/28/20 2202 01/31/20 1159   01/29/20 1127  vancomycin (VANCOCIN) 1-5 GM/200ML-% IVPB       Note to Pharmacy: [name]   : cabinet  override      01/29/20 1127 01/29/20 2329   01/28/20 1200  vancomycin (VANCOCIN) IVPB 750 mg/150 ml premix  Status:  Discontinued        750 mg 150 mL/hr over 60 Minutes Intravenous Every M-W-F (Hemodialysis) 01/28/20 1119 01/28/20 2202   01/28/20 0900  vancomycin (VANCOCIN) 2,500 mg in sodium chloride 0.9 % 500 mL IVPB        2,500 mg 250 mL/hr over 120 Minutes Intravenous  Once 01/28/20 0828 01/28/20 1221   01/28/20 0400  vancomycin (VANCOCIN) 2,500 mg in sodium chloride 0.9 % 500 mL IVPB  Status:  Discontinued        2,500 mg  250 mL/hr over 120 Minutes Intravenous  Once 01/28/20 0353 01/28/20 1530   01/28/20 0400  ceFEPIme (MAXIPIME) 2 g in sodium chloride 0.9 % 100 mL IVPB        2 g 200 mL/hr over 30 Minutes Intravenous  Once 01/28/20 0353 01/28/20 0646      Anticipated discharge to Home in 1-2 days pending wound VAC placement.  Lacinda Axon, MD 02/12/2020, 8:22 AM Pager: [facility]  Please contact the on call pager after 5 pm and on weekends at [phone].

## 2020-02-12 NOTE — Progress Notes (Signed)
Physical Therapy Treatment Patient Details Name: [name] MRN: [id number] DOB: 1965/01/07 Today's Date: 02/12/2020    History of Present Illness 55 y.o male admitted with left abscess s/p I&D with removal dialysis graft. S/p tunneled catheter exchange 02/06/20 this admission; Hx of End Stage Renal Disease on HD, PAD, nephrostomy complication.    PT Comments    Pt was seen for mobility on RW but then reviewed careful ROM and strengthening to BLE's.  He was given a copy of a HEP to reinforce exercises he can work on between therapy sessions.  Pt is motivated to recover his strength and tolerance fo rmobility, and will benefit from the management of emerging issues of ROM loss and reduced quality of gait.  Follow for goals of acute PT.  Follow Up Recommendations  Home health PT     Equipment Recommendations  Other (comment)    Recommendations for Other Services       Precautions / Restrictions Precautions Precautions: Fall Precaution Comments: R knee pain Restrictions Weight Bearing Restrictions: No    Mobility  Bed Mobility               General bed mobility comments: up in chair when PT arrived    Transfers Overall transfer level: Needs assistance Equipment used: Rolling walker (2 wheeled) Transfers: Sit to/from Stand Sit to Stand: Supervision         General transfer comment: depends on LLE to stand which is awkward due to his leg length difference  Ambulation/Gait Ambulation/Gait assistance: Supervision Gait Distance (Feet): 45 Feet Assistive device: Rolling walker (2 wheeled) Gait Pattern/deviations: Step-through pattern;Decreased stride length;Trunk flexed;Wide base of support;Antalgic;Decreased weight shift to right;Decreased step length - left;Decreased dorsiflexion - right     General Gait Details: reliant on LLE for support but RLE is longer, but in pain.  Awkward wgt shift as a result   Stairs             Wheelchair Mobility     Modified Rankin (Stroke Patients Only)       Balance Overall balance assessment: Needs assistance Sitting-balance support: Feet supported Sitting balance-Leahy Scale: Good     Standing balance support: Bilateral upper extremity supported Standing balance-Leahy Scale: Poor Standing balance comment: RW reliance to mintain comfort of RLE                            Cognition Arousal/Alertness: Awake/alert Behavior During Therapy: WFL for tasks assessed/performed Overall Cognitive Status: Within Functional Limits for tasks assessed                                        Exercises General Exercises - Lower Extremity Ankle Circles/Pumps: AAROM;5 reps Heel Slides: Strengthening;10 reps Hip ABduction/ADduction: AROM;10 reps    General Comments General comments (skin integrity, edema, etc.): Reuires better management of pain, due to the limitation of his mobiity from pain      Pertinent Vitals/Pain Pain Assessment: Faces Faces Pain Scale: Hurts little more Pain Location: R groin at sugical site Pain Descriptors / Indicators: Discomfort;Grimacing;Guarding;Operative site guarding Pain Intervention(s): Limited activity within patient's tolerance;Monitored during session;Repositioned    Home Living                      Prior Function            PT Goals (current  goals can now be found in the care plan section) Acute Rehab PT Goals Patient Stated Goal: Get well Progress towards PT goals: Progressing toward goals    Frequency    Min 3X/week      PT Plan Current plan remains appropriate    Co-evaluation              AM-PAC PT "6 Clicks" Mobility   Outcome Measure  Help needed turning from your back to your side while in a flat bed without using bedrails?: None Help needed moving from lying on your back to sitting on the side of a flat bed without using bedrails?: None Help needed moving to and from a bed to a chair  (including a wheelchair)?: None Help needed standing up from a chair using your arms (e.g., wheelchair or bedside chair)?: A Little Help needed to walk in hospital room?: A Little Help needed climbing 3-5 steps with a railing? : A Lot 6 Click Score: 20    End of Session Equipment Utilized During Treatment: Gait belt Activity Tolerance: Patient tolerated treatment well Patient left: in chair;with call bell/phone within reach Nurse Communication: Mobility status PT Visit Diagnosis: Other abnormalities of gait and mobility (R26.89);Muscle weakness (generalized) (M62.81);Difficulty in walking, not elsewhere classified (R26.2);Pain;Unsteadiness on feet (R26.81) Pain - Right/Left: Right Pain - part of body: Leg     Time: [id number] PT Time Calculation (min) (ACUTE ONLY): 25 min  Charges:  $Gait Training: 8-22 mins $Therapeutic Exercise: 8-22 mins                   Ramond Dial 02/12/2020, 10:12 PM Mee Hives, PT MS Acute Rehab Dept. Number: [place] [facility] [place]

## 2020-02-13 DIAGNOSIS — L02416 Cutaneous abscess of left lower limb: Secondary | ICD-10-CM | POA: Diagnosis not present

## 2020-02-13 DIAGNOSIS — D691 Qualitative platelet defects: Secondary | ICD-10-CM | POA: Diagnosis not present

## 2020-02-13 DIAGNOSIS — M10312 Gout due to renal impairment, left shoulder: Secondary | ICD-10-CM | POA: Diagnosis not present

## 2020-02-13 DIAGNOSIS — T829XXD Unspecified complication of cardiac and vascular prosthetic device, implant and graft, subsequent encounter: Secondary | ICD-10-CM | POA: Diagnosis not present

## 2020-02-13 LAB — CBC
HCT: 29.3 % — ABNORMAL LOW (ref 39.0–52.0)
Hemoglobin: 8.4 g/dL — ABNORMAL LOW (ref 13.0–17.0)
MCH: 29.1 pg (ref 26.0–34.0)
MCHC: 28.7 g/dL — ABNORMAL LOW (ref 30.0–36.0)
MCV: 101.4 fL — ABNORMAL HIGH (ref 80.0–100.0)
Platelets: 176 10*3/uL (ref 150–400)
RBC: 2.89 MIL/uL — ABNORMAL LOW (ref 4.22–5.81)
RDW: 18.6 % — ABNORMAL HIGH (ref 11.5–15.5)
WBC: 7.8 10*3/uL (ref 4.0–10.5)
nRBC: 0.4 % — ABNORMAL HIGH (ref 0.0–0.2)

## 2020-02-13 LAB — RENAL FUNCTION PANEL
Albumin: 3.1 g/dL — ABNORMAL LOW (ref 3.5–5.0)
Anion gap: 13 (ref 5–15)
BUN: 95 mg/dL — ABNORMAL HIGH (ref 6–20)
CO2: 26 mmol/L (ref 22–32)
Calcium: 9 mg/dL (ref 8.9–10.3)
Chloride: 99 mmol/L (ref 98–111)
Creatinine, Ser: 12.41 mg/dL — ABNORMAL HIGH (ref 0.61–1.24)
GFR, Estimated: 4 mL/min — ABNORMAL LOW (ref >60)
Glucose, Bld: 105 mg/dL — ABNORMAL HIGH (ref 70–99)
Phosphorus: 4.1 mg/dL (ref 2.5–4.6)
Potassium: 5.1 mmol/L (ref 3.5–5.1)
Sodium: 138 mmol/L (ref 135–145)

## 2020-02-13 LAB — GLUCOSE, CAPILLARY: Glucose-Capillary: 109 mg/dL — ABNORMAL HIGH (ref 70–99)

## 2020-02-13 MED ORDER — DOXERCALCIFEROL 4 MCG/2ML IV SOLN
INTRAVENOUS | Status: AC
  Filled 2020-02-13: qty 2

## 2020-02-13 MED ORDER — HEPARIN SODIUM (PORCINE) 1000 UNIT/ML IJ SOLN
INTRAMUSCULAR | Status: AC
  Administered 2020-02-13: 3700 [IU] via INTRAVENOUS_CENTRAL
  Filled 2020-02-13: qty 4

## 2020-02-13 MED ORDER — HEPARIN SODIUM (PORCINE) 1000 UNIT/ML DIALYSIS: 3700.0000 [IU] | Freq: Once | INTRAMUSCULAR | Status: AC

## 2020-02-13 MED ORDER — PROSOURCE PLUS PO LIQD
30.0000 mL | Freq: Three times a day (TID) | ORAL | Status: DC
  Administered 2020-02-13 – 2020-02-19 (×9): 30 mL via ORAL
  Filled 2020-02-13 (×16): qty 30

## 2020-02-13 MED ORDER — PROMETHAZINE HCL 25 MG/ML IJ SOLN
6.2500 mg | Freq: Once | INTRAMUSCULAR | Status: AC
  Administered 2020-02-13: 6.25 mg via INTRAVENOUS

## 2020-02-13 MED ORDER — RENA-VITE PO TABS
1.0000 | ORAL_TABLET | Freq: Every day | ORAL | Status: DC
  Administered 2020-02-13 – 2020-02-20 (×8): 1 via ORAL
  Filled 2020-02-13 (×8): qty 1

## 2020-02-13 MED ORDER — ALTEPLASE 2 MG IJ SOLR
INTRAMUSCULAR | Status: AC
  Filled 2020-02-13: qty 4

## 2020-02-13 MED ORDER — PROMETHAZINE HCL 25 MG/ML IJ SOLN
INTRAMUSCULAR | Status: AC
  Filled 2020-02-13: qty 1

## 2020-02-13 MED ORDER — HYDROMORPHONE HCL 1 MG/ML IJ SOLN
1.0000 mg | INTRAMUSCULAR | Status: DC | PRN
  Administered 2020-02-13 – 2020-02-17 (×10): 1 mg via INTRAVENOUS
  Administered 2020-02-17: 1.5 mg via INTRAVENOUS
  Filled 2020-02-13 (×6): qty 1
  Filled 2020-02-13: qty 2
  Filled 2020-02-13 (×3): qty 1
  Filled 2020-02-13: qty 2
  Filled 2020-02-13: qty 1

## 2020-02-13 NOTE — Progress Notes (Incomplete)
Patient expresses

## 2020-02-13 NOTE — Plan of Care (Signed)

## 2020-02-13 NOTE — Plan of Care (Signed)
  Problem: Education: Goal: Knowledge of General Education information will improve Description: Including pain rating scale, medication(s)/side effects and non-pharmacologic comfort measures 02/13/2020 0551 by Loma Messing, RN Outcome: Not Progressing 02/13/2020 0545 by Loma Messing, RN Outcome: Not Progressing   Problem: Health Behavior/Discharge Planning: Goal: Ability to manage health-related needs will improve 02/13/2020 0551 by Loma Messing, RN Outcome: Not Progressing 02/13/2020 0545 by Loma Messing, RN Outcome: Not Progressing   Problem: Clinical Measurements: Goal: Ability to maintain clinical measurements within normal limits will improve 02/13/2020 0551 by Loma Messing, RN Outcome: Not Progressing 02/13/2020 0545 by Loma Messing, RN Outcome: Not Progressing Goal: Will remain free from infection 02/13/2020 0551 by Loma Messing, RN Outcome: Not Progressing 02/13/2020 0545 by Loma Messing, RN Outcome: Not Progressing Goal: Diagnostic test results will improve 02/13/2020 0551 by Loma Messing, RN Outcome: Not Progressing 02/13/2020 0545 by Loma Messing, RN Outcome: Not Progressing Goal: Respiratory complications will improve 02/13/2020 0551 by Loma Messing, RN Outcome: Not Progressing 02/13/2020 0545 by Karsten Fells D, RN Outcome: Not Progressing Goal: Cardiovascular complication will be avoided 02/13/2020 0551 by Loma Messing, RN Outcome: Not Progressing 02/13/2020 0545 by Loma Messing, RN Outcome: Not Progressing   Problem: Activity: Goal: Risk for activity intolerance will decrease 02/13/2020 0551 by Loma Messing, RN Outcome: Not Progressing 02/13/2020 0545 by Loma Messing, RN Outcome: Not Progressing

## 2020-02-13 NOTE — Progress Notes (Addendum)
HD#16 Subjective:  Overnight Events: No acute events overnight.  Patient was assessed while in dialysis today.  States he continues to be in pain and the fentanyl patch has not started working yet.  Patient wants changes to his pain regimen for better pain control.  He is willing to whether fentanyl patch until he starts working but would like to go back on the schedule Dilaudid for today.  Objective:  Vital signs in last 24 hours: Vitals:   02/13/20 0930 02/13/20 1000 02/13/20 1023 02/13/20 1048  BP: (!) 103/54 (!) 103/54 (!) 87/48 (!) 129/51  Pulse:      Resp: '13 12 16 16  '$ Temp:    98.4 F (36.9 C)  TempSrc:      SpO2:    99%  Weight:    120 kg   Supplemental O2: Room Air SpO2: 99 % O2 Flow Rate (L/min): 2 L/min  Physical Exam:   General:  Well-appearing obese male laying in dialysis bed CV: Regular rate. No LE edema improved. Pulmonary: Normal effort. Extremities: Left thigh wound with dry dressings. No signs of infection.  Right femoral HD catheter patent.  Skin: Warm and dry.  Neuro: Alert and oriented x3.  No focal deficits.  Filed Weights   02/11/20 0748 02/11/20 1023 02/12/20 0740  Weight: 124.3 kg 120.3 kg 125.6 kg     Intake/Output Summary (Last 24 hours) at 02/13/2020 0600 Last data filed at 02/12/2020 2053 Gross per 24 hour  Intake 600 ml  Output 50 ml  Net 550 ml   Net IO Since Admission: -13,705.95 mL [02/13/20 0600]  Recent Labs    02/10/20 0720 02/12/20 0735  GLUCAP 106* 102*     Pertinent Labs: CBC Latest Ref Rng & Units 02/13/2020 02/11/2020 02/10/2020  WBC 4.0 - 10.5 K/uL 7.8 7.0 6.7  Hemoglobin 13.0 - 17.0 g/dL 8.4(L) 7.9(L) 7.9(L)  Hematocrit 39.0 - 52.0 % 29.3(L) 26.9(L) 25.2(L)  Platelets 150 - 400 K/uL 176 185 170    CMP Latest Ref Rng & Units 02/13/2020 02/12/2020 02/11/2020  Glucose 70 - 99 mg/dL 105(H) 124(H) 112(H)  BUN 6 - 20 mg/dL 95(H) 77(H) 86(H)  Creatinine 0.61 - 1.24 mg/dL 12.41(H) 11.33(H) 12.67(H)  Sodium 135 - 145  mmol/L 138 137 140  Potassium 3.5 - 5.1 mmol/L 5.1 4.4 4.5  Chloride 98 - 111 mmol/L 99 97(L) 99  CO2 22 - 32 mmol/L '26 26 26  '$ Calcium 8.9 - 10.3 mg/dL 9.0 8.6(L) 8.8(L)  Total Protein 6.5 - 8.1 g/dL - - -  Total Bilirubin 0.3 - 1.2 mg/dL - - -  Alkaline Phos 38 - 126 U/L - - -  AST 15 - 41 U/L - - -  ALT 0 - 44 U/L - - -    Imaging: No results found.  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 male with hx of ESRD on MWF HD, HTN, CAD, severe aortic stenosis, spina bifida, chronic opioid use, L knee septic arthritis who presented with epistaxis, abscess near L thigh fistula, and malodorous discharge from chronic wound due to prior L nephrostomy tube.  Now status post I&D of abscess and exchange of  right femoral catheter.  Pending placement of wound VAC on discharge home with home health.  Left Thigh Abscess S/p removal of L thigh infected AVG and I&D of abscess on 1/28. Patient has an open wound with ongoing drainage. Continues to have pain with dressing changes. Vascular does not think he is ready for wound VAC with on going drainage. Plan to continue wet-to-dry dressing changes through the weekend and placement of wound VAC on Monday before discharge if wound is kept dry. --Vascular following, appreciate assistance with wound management --Continue wet-to-dry dressing 4 times daily --Follow-up for hopeful wound VAC placement on Monday --Continue TED hose  Acute on Chronic Pain 2/2 refractory gout and recent thigh I&D with open wound R shoulder effusion & hemarthrosis  Ortho consulted this admission due to right shoulder hemarthrosis. Has a tendency to bleed into his  gout joints and are treating empirically for such. Patient started on fentanyl patch yesterday with IV Dilaudid during dressing changes. Continuing his home oxycodone. Plan to wean off IV Dilaudid 'Sunday prior to discharge on Monday.  Patient requests to have Dilaudid weaned off slowly. Fentanyl patch has not yet taken effect so we are continuing IV dilaudid pushes PRN today, hopefully can begin weaning tomorrow.  --Continue fentanyl patch 75 mcg/h --Start IV Dilaudid 1-1.5 prn q3h for breakthrough pain today --IV Dilaudid 2 mg prn for dressing changes --OxyIR 10 mg q6h and Percocet 1 tab q6h --Prednisone 40 mg daily (Day 9 of 14) --IV Phenergan 6.25 mg q4h  ESRD on MWF dialysis End Stage Access (R fem TDC) R femoral HD catheter exchanged on 2/4 now with good flow rate.  Plan for dialysis today.  Potassium trending up currently at 5.1.  --Completed HD today --Continue ASA & Plavix  --Daily RFP --Continue Kayexalate (Sat, Sun)  Spontaneous Ecchymosis 2/2 to DAPT and uremic platelet dysfunction  Epistaxis - resolved AoC Anemia 2/2 CKD and IDA Thrombocytopenia, resolved Hemoglobin stable at 8.4. No signs of active bleed. Has required multiple transfusions this admission as well as IV iron.  --Follow-up morning CBC --Continue Plavix and aspirin per patient request due to issues with TDC clotting --Daily CBCs  Left flank sinus tract: Continue wound care. Will follow up with urology in the outpatient. Vitamin D deficiency Metabolic Bone Disease Secondary Hyperparathryoidism Continue vitamin D supplementation  Diet: Renal diet IVF: PO intake VTE: Place TED hose Start: 02/08/20 1449 SCDs Start: 01/28/20 0551 Code: Full PT/OT: Home Health with PT ID:  Anti-infectives (From admission, onward)    Start     Dose/Rate Route Frequency Ordered Stop   02/02/20 1200  vancomycin (VANCOCIN) IVPB 1000 mg/200 mL premix  Status:  Discontinued        1,000 mg 200 mL/hr over 60 Minutes Intravenous  Every M-W-F (Hemodialysis) 02/01/20 1018 02/01/20 1024   01/30/20 1200  vancomycin (VANCOCIN) IVPB 1000 mg/200 mL premix  Status:  Discontinued        1,000 mg 200 mL/hr over 60 Minutes Intravenous Every M-W-F (Hemodialysis) 01/28/20 2202 02/01/20 1018   01/29/20 1200  vancomycin (VANCOCIN) IVPB 1000 mg/200 mL premix        1,000 mg 200 mL/hr over 60 Minutes Intravenous Every T-Th-Sa (Hemodialysis) 01/28/20 2202 01/31/20 1159   01/29/20 1127  vancomycin (VANCOCIN) 1-5 GM/200ML-% IVPB       Note to Pharmacy: Walker, Nina   : cabinet override      01'$ /27/22 1127 01/29/20 2329   01/28/20 1200  vancomycin (VANCOCIN) IVPB 750 mg/150 ml premix  Status:  Discontinued        750 mg 150 mL/hr over 60 Minutes Intravenous Every M-W-F (Hemodialysis) 01/28/20 1119 01/28/20 2202   01/28/20 0900  vancomycin (VANCOCIN) 2,500 mg in sodium chloride 0.9 % 500 mL IVPB        2,500 mg 250 mL/hr over 120 Minutes Intravenous  Once 01/28/20 0828 01/28/20 1221   01/28/20 0400  vancomycin (VANCOCIN) 2,500 mg in sodium chloride 0.9 % 500 mL IVPB  Status:  Discontinued        2,500 mg 250 mL/hr over 120 Minutes Intravenous  Once 01/28/20 0353 01/28/20 1530   01/28/20 0400  ceFEPIme (MAXIPIME) 2 g in sodium chloride 0.9 % 100 mL IVPB        2 g 200 mL/hr over 30 Minutes Intravenous  Once 01/28/20 0353 01/28/20 0646       Anticipated discharge to Home  On Monday  pending wound VAC placement.  Lacinda Axon, MD 02/13/2020, 6:00 AM Pager: [facility]  Please contact the on call pager after 5 pm and on weekends at [phone].

## 2020-02-13 NOTE — Progress Notes (Signed)
Patient ID: [name], male   DOB: [birth year], 55 y.o.   MRN: [id number] S: no c/o  O:BP (!) 129/51 (BP Location: Right Arm)   Pulse 63   Temp 98.4 F (36.9 C)   Resp 16   Wt 120 kg   SpO2 99%   BMI 36.90 kg/m   Intake/Output Summary (Last 24 hours) at 02/13/2020 1138 Last data filed at 02/13/2020 1048 Gross per 24 hour  Intake 720 ml  Output 4350 ml  Net -3630 ml   Intake/Output: I/O last 3 completed shifts: In: 600 [P.O.:600] Out: 50 [Urine:50]  Intake/Output this shift:  Total I/O In: 120 [P.O.:120] Out: 4300 [Other:4300] Weight change: 1.3 kg Gen: NAD CVS: RRR 2/6 sem Resp: CTA Abd: +BS, soft, NT/ND Ext: 1+ RLE edema  Studies/Results: No results found. . (feeding supplement) PROSource Plus  30 mL Oral TID BM  . aspirin EC  81 mg Oral Daily  . calcium acetate  2,668 mg Oral TID WC  . Chlorhexidine Gluconate Cloth  6 each Topical Q0600  . cholecalciferol  1,000 Units Oral Daily  . cinacalcet  30 mg Oral Q M,W,F-HD  . clopidogrel  75 mg Oral Daily  . doxercalciferol      . doxercalciferol  4 mcg Intravenous Q M,W,F-HD  . famotidine  40 mg Oral Daily  . fentaNYL  1 patch Transdermal Q72H  . levothyroxine  137 mcg Oral Q0600  . melatonin  10 mg Oral QHS  . multivitamin  1 tablet Oral QHS  . oxyCODONE  10 mg Oral Q6H   And  . oxyCODONE-acetaminophen  1 tablet Oral Q6H  . predniSONE  40 mg Oral Q breakfast  . sodium chloride flush  3 mL Intravenous Q12H  . sodium hypochlorite   Irrigation QID  . [START ON 02/14/2020] sodium polystyrene  15 g Oral Once per day on Sun Sat  . traZODone  100 mg Oral QHS  . zolpidem  10 mg Oral QHS   OP HD:AF MWF 4h 58mn 350/800 118kg 2/2.25 bath Hep 3700 R thigh TDC Sensipar 30 mg q HD Hectorol 4 mcg q HD Mircera 225 mcg IV q 2 weeks- last dose 200 mcg on 1/17, due on 1/31   Assessment/ Plan: 1. SP removal L thigh infected AVG material (old AVG's): 1/28 by VVS. Cx's neg, sp Vanc x 2 days then stopped. Not  on abx. Per VVS/ primary team.  2. L flank sinus tract:from site of previous nephrostomy tube. Bleeding abated 3. ESRD:MWF schedule. HD Friday.   4. HD access: pt has been an end-stage access patient for many years now, f/b VVS for the most part. [place]. Due to poor function Dr. BTrula Sladedid cath manipulation on 2/1 followed by catheter exchange on 2/4[facility]  5. Hypertension/volume:BP controlled. Lungs clear. Good UF today, down to 2kg up post HD this am.  6. Anemia:Hgb has been running low since last admission. ESA due 1/31, sp max dose darbe 200 ug on 2/7. Low tsat 16% > sp feraheme here '510mg'$  [id number]  7. Metabolic bone disease:Continue sensipar, hectorol and binders. Follow phos/calcium 8. Hyperkalemia- pt normally takes kayexalate at home on weekends to help w/ high K+ levels   RKelly Splinter MD 02/13/2020, 11:38 AM

## 2020-02-13 NOTE — Plan of Care (Signed)

## 2020-02-13 NOTE — Progress Notes (Signed)
Patient received '2mg'$  IV Dilaudid '@0615'$ . Scheduled '10mg'$  oxy PO  and 5/'325mg'$  percocet PO rescheduled at  0715.

## 2020-02-13 NOTE — Progress Notes (Signed)
Initial Nutrition Assessment  DOCUMENTATION CODES:   Obesity unspecified  INTERVENTION:   -Double protein portions with meals -30 ml Prosource Plus TID, each supplement provides 100 kcals and 15 grams protein -Renal MVI daily -Snacks TID with meals  NUTRITION DIAGNOSIS:   Increased nutrient needs related to wound healing as evidenced by estimated needs.  GOAL:   Patient will meet greater than or equal to 90% of their needs  MONITOR:   PO intake,Supplement acceptance,Labs,Weight trends,Skin,I & O's  REASON FOR ASSESSMENT:   Consult Assessment of nutrition requirement/status  ASSESSMENT:   55 year old man with complex medical history including ESRD on HD, myelo meningocele, severe AS, and PAD presenting with an abscess over his prior left femoral graft site, as well as right-sided chest pain and bruising, worsening drainage from his prior left nephrostomy site, and epistaxis.  Pt admitted with lt thigh abscess.   2/4- s/p Redo exposure of right femoral vein; Central venogram; Exchange of 55 cm tunneled femoral catheter using fluoroscopy  Reviewed I/O's: +550 ml x 24 hours and -10.7 L since 01/30/20  Pt unavailable at attempted visits x 2.   Per VVS notes, plan for wet to dry dressing changes and transition to wound vac placement on Monday (02/16/20).   Per RN notes, pt is requesting snacks between meals and does not like the options available for renal HS snack.   Pt with good appetite. Noted meal completion 100%.   Reviewed wt hx; wt has been stable over the past year. Per nephrology notes, EDW 118 kg.   Pt with increased nutritional needs for wound healing and would benefit from addition of oral nutrition supplements.   Medications reviewed and include phoslo, vitamin D3, sensipar, melatonin, and kayexalate.   Labs reviewed: CBGS: 102-109 (inpatient orders for glycemic control are none).   Diet Order:   Diet Order            Diet renal with fluid restriction  Fluid restriction: 1200 mL Fluid; Room service appropriate? Yes; Fluid consistency: Thin  Diet effective now                 EDUCATION NEEDS:   No education needs have been identified at this time  Skin:  Skin Assessment: Skin Integrity Issues: Skin Integrity Issues:: Incisions Incisions: lt leg, rt thigh  Last BM:  02/12/20  Height:   Ht Readings from Last 1 Encounters:  12/09/19 '5\' 11"'$  (1.803 m)    Weight:   Wt Readings from Last 1 Encounters:  02/13/20 120 kg    Ideal Body Weight:  78.2 kg  BMI:  Body mass index is 36.9 kg/m.  Estimated Nutritional Needs:   Kcal:  [id number]  Protein:  155-170 grams  Fluid:  1000 ml + UOP    Loistine Chance, RD, LDN, [place] Registered Dietitian II Certified Diabetes Care and Education Specialist Please refer to [facility] for RD and/or RD on-call/weekend/after hours pager

## 2020-02-14 LAB — CBC
HCT: 29 % — ABNORMAL LOW (ref 39.0–52.0)
Hemoglobin: 8.4 g/dL — ABNORMAL LOW (ref 13.0–17.0)
MCH: 29.7 pg (ref 26.0–34.0)
MCHC: 29 g/dL — ABNORMAL LOW (ref 30.0–36.0)
MCV: 102.5 fL — ABNORMAL HIGH (ref 80.0–100.0)
Platelets: 160 10*3/uL (ref 150–400)
RBC: 2.83 MIL/uL — ABNORMAL LOW (ref 4.22–5.81)
RDW: 19.4 % — ABNORMAL HIGH (ref 11.5–15.5)
WBC: 8.3 10*3/uL (ref 4.0–10.5)
nRBC: 0.4 % — ABNORMAL HIGH (ref 0.0–0.2)

## 2020-02-14 LAB — RENAL FUNCTION PANEL
Albumin: 3.1 g/dL — ABNORMAL LOW (ref 3.5–5.0)
Anion gap: 13 (ref 5–15)
BUN: 88 mg/dL — ABNORMAL HIGH (ref 6–20)
CO2: 24 mmol/L (ref 22–32)
Calcium: 8.7 mg/dL — ABNORMAL LOW (ref 8.9–10.3)
Chloride: 102 mmol/L (ref 98–111)
Creatinine, Ser: 11.53 mg/dL — ABNORMAL HIGH (ref 0.61–1.24)
GFR, Estimated: 5 mL/min — ABNORMAL LOW (ref >60)
Glucose, Bld: 109 mg/dL — ABNORMAL HIGH (ref 70–99)
Phosphorus: 3.7 mg/dL (ref 2.5–4.6)
Potassium: 4.8 mmol/L (ref 3.5–5.1)
Sodium: 139 mmol/L (ref 135–145)

## 2020-02-14 LAB — GLUCOSE, CAPILLARY: Glucose-Capillary: 88 mg/dL (ref 70–99)

## 2020-02-14 NOTE — Progress Notes (Signed)
Patient ID: [name], male   DOB: 1965-09-29, 55 y.o.   MRN: [id number] S: no c/o  O:BP (!) 97/55   Pulse 65   Temp 98.7 F (37.1 C) (Oral)   Resp 17   Wt 120 kg   SpO2 100%   BMI 36.90 kg/m   Intake/Output Summary (Last 24 hours) at 02/14/2020 1517 Last data filed at 02/13/2020 1934 Gross per 24 hour  Intake 0 ml  Output -  Net 0 ml   Intake/Output: I/O last 3 completed shifts: In: 360 [P.O.:360] Out: 4300 [Other:4300]  Intake/Output this shift:  No intake/output data recorded. Weight change: -1.45 kg Gen: NAD CVS: RRR 2/6 sem Resp: CTA Abd: +BS, soft, NT/ND Ext: 1+ RLE edema  Studies/Results: No results found. . (feeding supplement) PROSource Plus  30 mL Oral TID BM  . aspirin EC  81 mg Oral Daily  . calcium acetate  2,668 mg Oral TID WC  . Chlorhexidine Gluconate Cloth  6 each Topical Q0600  . cholecalciferol  1,000 Units Oral Daily  . cinacalcet  30 mg Oral Q M,W,F-HD  . clopidogrel  75 mg Oral Daily  . doxercalciferol  4 mcg Intravenous Q M,W,F-HD  . famotidine  40 mg Oral Daily  . fentaNYL  1 patch Transdermal Q72H  . levothyroxine  137 mcg Oral Q0600  . melatonin  10 mg Oral QHS  . multivitamin  1 tablet Oral QHS  . oxyCODONE  10 mg Oral Q6H   And  . oxyCODONE-acetaminophen  1 tablet Oral Q6H  . predniSONE  40 mg Oral Q breakfast  . sodium chloride flush  3 mL Intravenous Q12H  . sodium polystyrene  15 g Oral Once per day on Sun Sat  . traZODone  100 mg Oral QHS  . zolpidem  10 mg Oral QHS   OP HD:AF MWF 4h 85mn 350/800 118kg 2/2.25 bath Hep 3700 R thigh TDC Sensipar 30 mg q HD Hectorol 4 mcg q HD Mircera 225 mcg IV q 2 weeks- last dose 200 mcg on 1/17, due on 1/31   Assessment/ Plan: 1. SP removal L thigh infected AVG material (old AVG's): 1/28 by VVS. Cx's neg, sp Vanc x 2 days then stopped. Not on abx. Per VVS/ primary team.  2. L flank sinus tract:from site of previous nephrostomy tube. Bleeding abated 3. ESRD:MWF  schedule. Next HD Monday. Pt requested more/ longer dialysis w/ hopes of getting extra volume off.   4. HD access: pt has been an end-stage access patient for many years, f/b VVS for the most part. Last R fem TDC [place] 3 [place] ago. Due to poor function here, VVS did cath manipulation on 2/1 followed by catheter exchange on 2/4[facility]  5. Hypertension/volume:BP controlled. Lungs clear. Good UF w/ HD yesterday. May need longer HD on Monday for volume excess.  6. Anemia:Hgb has been running low since last admission. ESA due 1/31, sp max dose darbe 200 ug on 2/7. Low tsat 16% > sp feraheme here '510mg'$  [id number]  7. Metabolic bone disease:Continue sensipar, hectorol and binders. Follow phos/calcium 8. Hyperkalemia- pt normally takes kayexalate at home on weekends to help w/ high K+ levels   RKelly Splinter MD 02/14/2020, 3:17 PM

## 2020-02-14 NOTE — Progress Notes (Signed)
HD#17 Subjective:   No significant overnight events.  Discussed pain management moving forward. He does not feel that he has gotten much relief with the 69mg fentanyl patch and has required the dilaudid IV every three hours. He notes that the fentanyl patch at the same dose had worked well for him in the past. He understands that the IV dilaudid is not a long term solution and that he will have a certain degree of pain in the future. We discussed trying to go up on his fentanyl patch when it is due to be changed tomorrow which he was agreeable to.   He is also worried about what will happen if the wound vac can not be placed as he is unable to care for his wounds at home by himself. He mentioned [facility]. Discussed that we will have to cross that bridge when we get there.  He wants a tKuwaitsandwich, rice krispy, and some juice between his meals. I told him I would work on arranging this for him if possible.  Finally, he brought up the conversation of end of life. He recognizes that he has been on HD for 25 years and that he will likely die in the next 5 years. He does not currently have an advance directive. I relayed the importance of getting this done so that, when the time comes, his wishes can be followed. He did not imply that he wishes to change code status at this time but was just looking into the future.  Objective:  Vital signs in last 24 hours: Vitals:   02/13/20 1933 02/13/20 2144 02/14/20 0554 02/14/20 0904  BP:  (!) 150/70 (!) 151/129 (!) 97/55  Pulse:  76 65   Resp: '13 17 15 17  '$ Temp:  98 F (36.7 C) 98 F (36.7 C) 98.7 F (37.1 C)  TempSrc:  Oral Oral Oral  SpO2:  99% 99% 100%  Weight:       Supplemental O2: Room Air SpO2: 100 % O2 Flow Rate (L/min): 2 L/min (02 FOR DIALYSIS PER PT REQUEST ONLY)  Physical Exam:   General: chronically ill appearing Pulm: breathing comfortably on room air Skin: pale. Clean, intact dressing to the bilateral  groin folds.  Psych: normal affect  Filed Weights   02/13/20 0727 02/13/20 0800 02/13/20 1048  Weight: 124.1 kg 124.1 kg 120 kg     Intake/Output Summary (Last 24 hours) at 02/14/2020 1105 Last data filed at 02/13/2020 1934 Gross per 24 hour  Intake 0 ml  Output --  Net 0 ml    Pertinent Labs: CBC Latest Ref Rng & Units 02/14/2020 02/13/2020 02/11/2020  WBC 4.0 - 10.5 K/uL 8.3 7.8 7.0  Hemoglobin 13.0 - 17.0 g/dL 8.4(L) 8.4(L) 7.9(L)  Hematocrit 39.0 - 52.0 % 29.0(L) 29.3(L) 26.9(L)  Platelets 150 - 400 K/uL 160 176 185    CMP Latest Ref Rng & Units 02/14/2020 02/13/2020 02/12/2020  Glucose 70 - 99 mg/dL 109(H) 105(H) 124(H)  BUN 6 - 20 mg/dL 88(H) 95(H) 77(H)  Creatinine 0.61 - 1.24 mg/dL 11.53(H) 12.41(H) 11.33(H)  Sodium 135 - 145 mmol/L 139 138 137  Potassium 3.5 - 5.1 mmol/L 4.8 5.1 4.4  Chloride 98 - 111 mmol/L 102 99 97(L)  CO2 22 - 32 mmol/L '24 26 26  '$ Calcium 8.9 - 10.3 mg/dL 8.7(L) 9.0 8.6(L)  Total Protein 6.5 - 8.1 g/dL - - -  Total Bilirubin 0.3 - 1.2 mg/dL - - -  Alkaline Phos 38 -  126 U/L - - -  AST 15 - 41 U/L - - -  ALT 0 - 44 U/L - - -    Imaging: No results found.  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 male with hx of ESRD on MWF HD, HTN, CAD, severe aortic stenosis, spina bifida, chronic opioid use, L knee septic arthritis who presented with epistaxis, abscess near L thigh fistula, and malodorous discharge from chronic wound due to prior L nephrostomy tube.  Now status post I&D of abscess and exchange of right femoral catheter.  Pending placement of wound VAC on discharge home with home health.  Left Thigh Abscess s/p I&D with  catheter removal 1/28, s/p 5d vancomycin --Pain management as noted below --Vascular following, appreciate assistance with wound management. Hopeful for wound vac placement 2/14 --Continue wet-to-dry dressing 4 times daily  Acute on Chronic Pain 2/2 refractory gout, R shoulder effusion & hemarthrosis  --Continue fentanyl patch 75 mcg/h for today. Will increase to 129mg patch tomorrow --continue IV dilaudid q3h prn until wound vac is placed --IV Dilaudid 2 mg prn for dressing changes --OxyIR 10 mg q6h and Percocet 1 tab q6h --Can consider palliative care consult for assistance with pain management if this continues to be an issue --Prednisone 40 mg daily (Day 10 of 14) --IV Phenergan 6.25 mg q4h  ESRD on MWF dialysis s/p right femoral HD cath exchange 2/4 --Continue ASA & Plavix  --Daily RFP --Continue Kayexalate (Sat, Sun)  Spontaneous Ecchymosis 2/2 to DAPT and uremic platelet dysfunction  Epistaxis - resolved AoC Anemia 2/2 CKD  --Daily CBCs  Left flank sinus tract: Continue wound care. Will follow up with urology in the outpatient.  Need for Advance directive. Spiritual care consulted for assistance.  Diet: Renal diet IVF: PO intake VTE: Place TED hose Start: 02/08/20 1449 SCDs Start: 01/28/20 0551 Code: Full PT/OT: Home Health with PT Dispo: suspect d/c in 3-4 days pending wound vac and pain management  . RMitzi Hansen MD Internal Medicine Resident PGY-2 [facility] Pager: #[phone]/12/2020 11:17 AM    Please contact the on call pager after 5 pm and on weekends at [phone]

## 2020-02-14 NOTE — Progress Notes (Signed)
Subjective  -   Had trouble sleeping last night because he could not get his light off Does not complain of any groin pain   Physical Exam:  Left groin dressing is clean and dry.  The wound does not have. Right groin without drainage       Assessment/Plan:    We will plan for continue wet-to-dry dressing changes over the weekend with anticipated plans for wound VAC placement on Monday.  If we are able to get a seal with his wound VAC he can be discharged.  [name] 02/14/2020 7:09 AM --  Vitals:   02/13/20 2144 02/14/20 0554  BP: (!) 150/70 (!) 151/129  Pulse: 76 65  Resp: 17 15  Temp: 98 F (36.7 C) 98 F (36.7 C)  SpO2: 99% 99%    Intake/Output Summary (Last 24 hours) at 02/14/2020 0709 Last data filed at 02/13/2020 1934 Gross per 24 hour  Intake 120 ml  Output 4300 ml  Net -4180 ml     Laboratory CBC    Component Value Date/Time   WBC 7.8 02/13/2020 0637   HGB 8.4 (L) 02/13/2020 0637   HCT 29.3 (L) 02/13/2020 0637   PLT 176 02/13/2020 0637    BMET    Component Value Date/Time   NA 139 02/14/2020 0429   NA 135 (A) 01/17/2018 0000   K 4.8 02/14/2020 0429   CL 102 02/14/2020 0429   CO2 24 02/14/2020 0429   GLUCOSE 109 (H) 02/14/2020 0429   BUN 88 (H) 02/14/2020 0429   BUN 67 (A) 01/17/2018 0000   CREATININE 11.53 (H) 02/14/2020 0429   CALCIUM 8.7 (L) 02/14/2020 0429   CALCIUM 8.5 (L) 01/31/2020 1157   GFRNONAA 5 (L) 02/14/2020 0429   GFRAA 10 (L) 10/07/2019 0430    COAG Lab Results  Component Value Date   INR 1.1 01/28/2020   INR 1.1 12/11/2019   INR 1.0 12/01/2018   No results found for: PTT  Antibiotics Anti-infectives (From admission, onward)   Start     Dose/Rate Route Frequency Ordered Stop   02/02/20 1200  vancomycin (VANCOCIN) IVPB 1000 mg/200 mL premix  Status:  Discontinued        1,000 mg 200 mL/hr over 60 Minutes Intravenous Every M-W-F (Hemodialysis) 02/01/20 1018 02/01/20 1024   01/30/20 1200  vancomycin  (VANCOCIN) IVPB 1000 mg/200 mL premix  Status:  Discontinued        1,000 mg 200 mL/hr over 60 Minutes Intravenous Every M-W-F (Hemodialysis) 01/28/20 2202 02/01/20 1018   01/29/20 1200  vancomycin (VANCOCIN) IVPB 1000 mg/200 mL premix        1,000 mg 200 mL/hr over 60 Minutes Intravenous Every T-Th-Sa (Hemodialysis) 01/28/20 2202 01/31/20 1159   01/29/20 1127  vancomycin (VANCOCIN) 1-5 GM/200ML-% IVPB       Note to Pharmacy: [name]   : cabinet override      01/29/20 1127 01/29/20 2329   01/28/20 1200  vancomycin (VANCOCIN) IVPB 750 mg/150 ml premix  Status:  Discontinued        750 mg 150 mL/hr over 60 Minutes Intravenous Every M-W-F (Hemodialysis) 01/28/20 1119 01/28/20 2202   01/28/20 0900  vancomycin (VANCOCIN) 2,500 mg in sodium chloride 0.9 % 500 mL IVPB        2,500 mg 250 mL/hr over 120 Minutes Intravenous  Once 01/28/20 0828 01/28/20 1221   01/28/20 0400  vancomycin (VANCOCIN) 2,500 mg in sodium chloride 0.9 % 500 mL IVPB  Status:  Discontinued  2,500 mg 250 mL/hr over 120 Minutes Intravenous  Once 01/28/20 0353 01/28/20 1530   01/28/20 0400  ceFEPIme (MAXIPIME) 2 g in sodium chloride 0.9 % 100 mL IVPB        2 g 200 mL/hr over 30 Minutes Intravenous  Once 01/28/20 0353 01/28/20 0646       V. Leia Alf, M.D., [facility] [place] Office: [phone] Pager:  [phone]

## 2020-02-14 NOTE — Plan of Care (Signed)

## 2020-02-15 LAB — RENAL FUNCTION PANEL
Albumin: 3.3 g/dL — ABNORMAL LOW (ref 3.5–5.0)
Anion gap: 15 (ref 5–15)
BUN: 125 mg/dL — ABNORMAL HIGH (ref 6–20)
CO2: 23 mmol/L (ref 22–32)
Calcium: 9 mg/dL (ref 8.9–10.3)
Chloride: 101 mmol/L (ref 98–111)
Creatinine, Ser: 13.59 mg/dL — ABNORMAL HIGH (ref 0.61–1.24)
GFR, Estimated: 4 mL/min — ABNORMAL LOW (ref >60)
Glucose, Bld: 104 mg/dL — ABNORMAL HIGH (ref 70–99)
Phosphorus: 4.4 mg/dL (ref 2.5–4.6)
Potassium: 5.4 mmol/L — ABNORMAL HIGH (ref 3.5–5.1)
Sodium: 139 mmol/L (ref 135–145)

## 2020-02-15 LAB — GLUCOSE, CAPILLARY: Glucose-Capillary: 103 mg/dL — ABNORMAL HIGH (ref 70–99)

## 2020-02-15 MED ORDER — FENTANYL 100 MCG/HR TD PT72
1.0000 | MEDICATED_PATCH | TRANSDERMAL | Status: DC
  Administered 2020-02-15 – 2020-02-21 (×3): 1 via TRANSDERMAL
  Filled 2020-02-15 (×5): qty 1

## 2020-02-15 MED ORDER — BIOTENE DRY MOUTH MT LIQD: 15.0000 mL | OROMUCOSAL | Status: DC | PRN

## 2020-02-15 NOTE — Progress Notes (Signed)
HD#18 Subjective:   No significant overnight events. No new complaints this morning  Objective:  Vital signs in last 24 hours: Vitals:   02/14/20 1833 02/14/20 1836 02/14/20 2146 02/15/20 0039  BP:   (!) 144/112   Pulse:   70   Resp: '14 10  12  '$ Temp:   98.6 F (37 C)   TempSrc:   Oral   SpO2:   100%   Weight:       Supplemental O2: Room Air SpO2: 100 % O2 Flow Rate (L/min): 2 L/min (02 FOR DIALYSIS PER PT REQUEST ONLY)  Physical Exam:   General: chronically ill appearing, in NAD Cardiac: RRR, systolic murmur Pulm: breathing comfortably on room air, lungs clear  Filed Weights   02/13/20 0727 02/13/20 0800 02/13/20 1048  Weight: 124.1 kg 124.1 kg 120 kg    No intake or output data in the 24 hours ending 02/15/20 0518  Pertinent Labs: CBC Latest Ref Rng & Units 02/14/2020 02/13/2020 02/11/2020  WBC 4.0 - 10.5 K/uL 8.3 7.8 7.0  Hemoglobin 13.0 - 17.0 g/dL 8.4(L) 8.4(L) 7.9(L)  Hematocrit 39.0 - 52.0 % 29.0(L) 29.3(L) 26.9(L)  Platelets 150 - 400 K/uL 160 176 185    CMP Latest Ref Rng & Units 02/15/2020 02/14/2020 02/13/2020  Glucose 70 - 99 mg/dL 104(H) 109(H) 105(H)  BUN 6 - 20 mg/dL 125(H) 88(H) 95(H)  Creatinine 0.61 - 1.24 mg/dL 13.59(H) 11.53(H) 12.41(H)  Sodium 135 - 145 mmol/L 139 139 138  Potassium 3.5 - 5.1 mmol/L 5.4(H) 4.8 5.1  Chloride 98 - 111 mmol/L 101 102 99  CO2 22 - 32 mmol/L '23 24 26  '$ Calcium 8.9 - 10.3 mg/dL 9.0 8.7(L) 9.0  Total Protein 6.5 - 8.1 g/dL - - -  Total Bilirubin 0.3 - 1.2 mg/dL - - -  Alkaline Phos 38 - 126 U/L - - -  AST 15 - 41 U/L - - -  ALT 0 - 44 U/L - - -    Assessment/Plan:   Principal Problem:   Abscess of left thigh Active Problems:   End stage renal disease (HCC)   PAD (peripheral artery disease) (HCC)   Nephrostomy complication (HCC)   Complication of vascular access for dialysis   Platelet dysfunction (HCC)   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  male with hx of ESRD on MWF HD, HTN, CAD, severe aortic stenosis, spina bifida, chronic opioid use, L knee septic arthritis who presented with epistaxis, abscess near L thigh fistula, and malodorous discharge from chronic wound due to prior L nephrostomy tube.  Now status post I&D of abscess and exchange of right femoral catheter.  Pending placement of wound VAC on discharge home with home health.  Left Thigh Abscess s/p I&D with catheter removal 1/28, s/p 5d vancomycin --Pain management as noted below --Vascular following, appreciate assistance with wound management. Hopeful for wound vac placement 2/14 --Continue wet-to-dry dressing 4 times daily  Acute on Chronic Pain 2/2 refractory gout, R shoulder effusion & hemarthrosis, bilateral groin wounds --increase fentanyl patch to 180mg --continue IV dilaudid q3h prn until wound vac is placed --IV Dilaudid 2 mg prn for dressing changes --OxyIR 10 mg q6h and Percocet 1 tab q6h --Can consider palliative care consult for assistance with pain management if this continues to be an issue --Prednisone 40 mg daily (Day 11 of 14) --IV Phenergan 6.25 mg q4h  ESRD on MWF dialysis s/p right femoral  HD cath exchange 2/4 --Continue ASA & Plavix  --Daily RFP --Continue Kayexalate (Sat, Sun)  Spontaneous Ecchymosis 2/2 to DAPT and uremic platelet dysfunction  Epistaxis - resolved AoC Anemia 2/2 CKD  --Daily CBCs  Left flank sinus tract: Continue wound care. Will follow up with urology in the outpatient.  Need for Advance directive. Spiritual care consulted for assistance.  Diet: Renal diet IVF: PO intake VTE: Place TED hose Start: 02/08/20 1449 SCDs Start: 01/28/20 0551 Code: Full PT/OT: Home Health with PT Dispo: suspect d/c in 3-4 days pending wound vac and pain management  . Mitzi Hansen, MD Internal Medicine Resident PGY-2 [name] Pager: [phone] 02/15/2020 5:18 AM    Please contact the on call  pager after 5 pm and on weekends at [phone].

## 2020-02-15 NOTE — Progress Notes (Signed)
Patient ID: [name], male   DOB: 1965/07/06, 55 y.o.   MRN: [id number] S: no c/o today, up in chair  O:BP 116/66   Pulse 70   Temp 98.7 F (37.1 C) (Oral)   Resp 14   Wt 120 kg   SpO2 100%   BMI 36.90 kg/m  No intake or output data in the 24 hours ending 02/15/20 1256 Intake/Output: No intake/output data recorded.  Intake/Output this shift:  No intake/output data recorded. Weight change:  Gen: NAD CVS: RRR 2/6 sem Resp: CTA Abd: +BS, soft, NT/ND Ext: 1-2+ RLE edema  Studies/Results: No results found. . (feeding supplement) PROSource Plus  30 mL Oral TID BM  . aspirin EC  81 mg Oral Daily  . calcium acetate  2,668 mg Oral TID WC  . Chlorhexidine Gluconate Cloth  6 each Topical Q0600  . cholecalciferol  1,000 Units Oral Daily  . cinacalcet  30 mg Oral Q M,W,F-HD  . clopidogrel  75 mg Oral Daily  . doxercalciferol  4 mcg Intravenous Q M,W,F-HD  . famotidine  40 mg Oral Daily  . fentaNYL  1 patch Transdermal Q72H  . levothyroxine  137 mcg Oral Q0600  . melatonin  10 mg Oral QHS  . multivitamin  1 tablet Oral QHS  . oxyCODONE  10 mg Oral Q6H   And  . oxyCODONE-acetaminophen  1 tablet Oral Q6H  . predniSONE  40 mg Oral Q breakfast  . sodium chloride flush  3 mL Intravenous Q12H  . sodium polystyrene  15 g Oral Once per day on Sun Sat  . traZODone  100 mg Oral QHS  . zolpidem  10 mg Oral QHS   OP HD:AF MWF 4h 22mn 350/800 118kg 2/2.25 bath Hep 3700 R thigh TDC Sensipar 30 mg q HD Hectorol 4 mcg q HD Mircera 225 mcg IV q 2 weeks- last dose 200 mcg on 1/17, due on 1/31   Assessment/ Plan: 1. SP removal L thigh infected AVG material (old AVG's): 1/28 by VVS. Cx's neg, sp Vanc x 2 days then stopped. Cx's neg, afeb, abx were dc'd. Per VVS will try to place wound VAC tomorrow (Monday). If wound VAC takes , pt can be dc'd.  2. L flank sinus tract:from site of previous nephrostomy tube. Bleeding resolved.  3. ESRD:MWF schedule. Next HD Monday. Pt  requested longer HD Monday w/ hopes of getting extra volume off, thinks he is losing body wt, may need lower edw.  Plan HD Monday 3.5h, 4-5 L UF.  4. HD access: pt has been an end-stage access patient for many years, f/b [facility] for the most part. [place]. Due to poor function here, VVS did cath manipulation on 2/1 followed by catheter exchange on 2/4. [facility]  5. Hypertension/volume:BP controlled. Lungs clear, +LE edema.  6. Anemia:Hgb has been running low since last admission. ESA due 1/31, sp max dose darbe 200 ug on 2/7. Low tsat 16% > sp feraheme here '510mg'$  [id number]  7. Metabolic bone disease:Continue sensipar, hectorol and binders. Follow phos/calcium 8. Hyperkalemia- pt normally takes kayexalate at home on weekends to help w/ high K+ levels   RKelly Splinter MD 02/15/2020, 12:56 PM

## 2020-02-16 DIAGNOSIS — T829XXD Unspecified complication of cardiac and vascular prosthetic device, implant and graft, subsequent encounter: Secondary | ICD-10-CM | POA: Diagnosis not present

## 2020-02-16 DIAGNOSIS — D691 Qualitative platelet defects: Secondary | ICD-10-CM | POA: Diagnosis not present

## 2020-02-16 DIAGNOSIS — M10312 Gout due to renal impairment, left shoulder: Secondary | ICD-10-CM | POA: Diagnosis not present

## 2020-02-16 DIAGNOSIS — L02416 Cutaneous abscess of left lower limb: Secondary | ICD-10-CM | POA: Diagnosis not present

## 2020-02-16 LAB — RENAL FUNCTION PANEL
Albumin: 3.6 g/dL (ref 3.5–5.0)
Anion gap: 17 — ABNORMAL HIGH (ref 5–15)
BUN: 158 mg/dL — ABNORMAL HIGH (ref 6–20)
CO2: 22 mmol/L (ref 22–32)
Calcium: 9.1 mg/dL (ref 8.9–10.3)
Chloride: 99 mmol/L (ref 98–111)
Creatinine, Ser: 15.44 mg/dL — ABNORMAL HIGH (ref 0.61–1.24)
GFR, Estimated: 3 mL/min — ABNORMAL LOW (ref >60)
Glucose, Bld: 123 mg/dL — ABNORMAL HIGH (ref 70–99)
Phosphorus: 6.6 mg/dL — ABNORMAL HIGH (ref 2.5–4.6)
Potassium: 5.8 mmol/L — ABNORMAL HIGH (ref 3.5–5.1)
Sodium: 138 mmol/L (ref 135–145)

## 2020-02-16 LAB — CBC
HCT: 31.4 % — ABNORMAL LOW (ref 39.0–52.0)
Hemoglobin: 9.1 g/dL — ABNORMAL LOW (ref 13.0–17.0)
MCH: 29.8 pg (ref 26.0–34.0)
MCHC: 29 g/dL — ABNORMAL LOW (ref 30.0–36.0)
MCV: 103 fL — ABNORMAL HIGH (ref 80.0–100.0)
Platelets: 156 10*3/uL (ref 150–400)
RBC: 3.05 MIL/uL — ABNORMAL LOW (ref 4.22–5.81)
RDW: 21 % — ABNORMAL HIGH (ref 11.5–15.5)
WBC: 12.2 10*3/uL — ABNORMAL HIGH (ref 4.0–10.5)
nRBC: 0 % (ref 0.0–0.2)

## 2020-02-16 LAB — GLUCOSE, CAPILLARY: Glucose-Capillary: 82 mg/dL (ref 70–99)

## 2020-02-16 MED ORDER — DOXERCALCIFEROL 4 MCG/2ML IV SOLN
INTRAVENOUS | Status: AC
  Filled 2020-02-16: qty 2

## 2020-02-16 MED ORDER — ALTEPLASE 2 MG IJ SOLR
INTRAMUSCULAR | Status: AC
  Administered 2020-02-16: 2 mg
  Filled 2020-02-16: qty 2

## 2020-02-16 MED ORDER — HEPARIN SODIUM (PORCINE) 1000 UNIT/ML IJ SOLN
INTRAMUSCULAR | Status: AC
  Administered 2020-02-16: 1000 [IU]
  Filled 2020-02-16: qty 4

## 2020-02-16 MED ORDER — PROMETHAZINE HCL 25 MG/ML IJ SOLN
INTRAMUSCULAR | Status: AC
  Administered 2020-02-17: 6.25 mg via INTRAVENOUS
  Filled 2020-02-16: qty 1

## 2020-02-16 MED ORDER — ALTEPLASE 2 MG IJ SOLR
INTRAMUSCULAR | Status: AC
  Administered 2020-02-16: 2 mg
  Filled 2020-02-16: qty 4

## 2020-02-16 MED ORDER — SODIUM POLYSTYRENE SULFONATE 15 GM/60ML PO SUSP
15.0000 g | Freq: Once | ORAL | Status: DC
  Filled 2020-02-16: qty 60

## 2020-02-16 NOTE — Progress Notes (Addendum)
Progress Note    02/16/2020 8:56 AM 10 Days Post-Op  Subjective:  C/o drainage from left flank   Vitals:   02/15/20 1932 02/15/20 2005  BP:  (!) 144/77  Pulse:  64  Resp: 10 18  Temp:  98.1 F (36.7 C)  SpO2:  100%    Physical Exam: General appearance: Awake, alert in no apparent distress Cardiac: Heart rate and rhythm are regular Respirations: Nonlabored Incisions: right groin well approximated without bleeding or hematoma> no signs of infection Left groin: dressing removed. Outer abd pad dry. No odor. Clear drainage and now has beefy red granulation tissue with minimal residual devitalized tissue in wound base   Left flank: dressing and pillow case stained with cloudy, bloody drainage.       CBC    Component Value Date/Time   WBC 8.3 02/14/2020 0429   RBC 2.83 (L) 02/14/2020 0429   HGB 8.4 (L) 02/14/2020 0429   HCT 29.0 (L) 02/14/2020 0429   PLT 160 02/14/2020 0429   MCV 102.5 (H) 02/14/2020 0429   MCH 29.7 02/14/2020 0429   MCHC 29.0 (L) 02/14/2020 0429   RDW 19.4 (H) 02/14/2020 0429   LYMPHSABS 0.5 (L) 02/02/2020 0101   MONOABS 0.4 02/02/2020 0101   EOSABS 0.3 02/02/2020 0101   BASOSABS 0.0 02/02/2020 0101    BMET    Component Value Date/Time   NA 139 02/15/2020 0400   NA 135 (A) 01/17/2018 0000   K 5.4 (H) 02/15/2020 0400   CL 101 02/15/2020 0400   CO2 23 02/15/2020 0400   GLUCOSE 104 (H) 02/15/2020 0400   BUN 125 (H) 02/15/2020 0400   BUN 67 (A) 01/17/2018 0000   CREATININE 13.59 (H) 02/15/2020 0400   CALCIUM 9.0 02/15/2020 0400   CALCIUM 8.5 (L) 01/31/2020 1157   GFRNONAA 4 (L) 02/15/2020 0400   GFRAA 10 (L) 10/07/2019 0430    No intake or output data in the 24 hours ending 02/16/20 [place] Scheduled Meds: . (feeding supplement) PROSource Plus  30 mL Oral TID BM  . aspirin EC  81 mg Oral Daily  . calcium acetate  2,668 mg Oral TID WC  . Chlorhexidine Gluconate Cloth  6 each Topical Q0600  . cholecalciferol  1,000  Units Oral Daily  . cinacalcet  30 mg Oral Q M,W,F-HD  . clopidogrel  75 mg Oral Daily  . doxercalciferol  4 mcg Intravenous Q M,W,F-HD  . famotidine  40 mg Oral Daily  . fentaNYL  1 patch Transdermal Q72H  . levothyroxine  137 mcg Oral Q0600  . melatonin  10 mg Oral QHS  . multivitamin  1 tablet Oral QHS  . oxyCODONE  10 mg Oral Q6H   And  . oxyCODONE-acetaminophen  1 tablet Oral Q6H  . predniSONE  40 mg Oral Q breakfast  . sodium chloride flush  3 mL Intravenous Q12H  . sodium polystyrene  15 g Oral Once per day on Sun Sat  . traZODone  100 mg Oral QHS  . zolpidem  10 mg Oral QHS   Continuous Infusions: . sodium chloride     PRN Meds:.sodium chloride, antiseptic oral rinse, fluticasone, HYDROmorphone (DILAUDID) injection, HYDROmorphone (DILAUDID) injection, promethazine, senna, sodium chloride flush  Assessment and Plan: 1) s/p L thigh AVG explantation secondary to infection. Intra-op cultures negative. He is afebrile with normal WBC.  Wound much improved over last 7 days. Will order wound VAC placement.  2) s/p manipulation of right femoral TDC 2/1 and catheter  exchange 2/4. Functioning well.  3) drainage from left flank noted>>management per primary team  Plavix and aspirin continue   Risa Grill, PA-C [facility] [phone] 02/16/2020  8:56 AM   I agree with the above.  Once his wound VAC is in place and functioning, he can be discharged from a vascular perspective  Wells Aleira Deiter

## 2020-02-16 NOTE — Progress Notes (Signed)
HD#19 Subjective:  Overnight Events: No acute events overnight.  Patient states he continues to be in pain. He continues to have leg swelling but that has improved. He is concerned about losing body weight. He is aware of plan for wound VAC today and states he is concern about the wound VAC not working. He continues to deny any CP, SOB or abd pain.   Objective:  Vital signs in last 24 hours: Vitals:   02/15/20 1652 02/15/20 1832 02/15/20 1932 02/15/20 2005  BP: 131/69   (!) 144/77  Pulse:    64  Resp: '10 10 10 18  '$ Temp:    98.1 F (36.7 C)  TempSrc:      SpO2:    100%  Weight:       Supplemental O2: Room Air SpO2: 100 % O2 Flow Rate (L/min): 2 L/min (02 FOR DIALYSIS PER PT REQUEST ONLY)  Physical Exam:   General: Well-appearing man laying in bed. NAD CV: Regular rate. Stable IV/VI systolic murmur. +1 LE edema Pulmonary: Lungs CTAB on anterior chest wall. Normal effort. Extremities: Left thigh wound with dry dressing. Left sinus tract with displaced dressing and some bleeding. R femoral HD catheter patent w/ no surrounding blood.  Skin: Warm and dry.  Neuro: A&Ox3. No focal deficits.  Filed Weights   02/13/20 0727 02/13/20 0800 02/13/20 1048  Weight: 124.1 kg 124.1 kg 120 kg    No intake or output data in the 24 hours ending 02/16/20 0633 Net IO Since Admission: -17,885.95 mL [02/16/20 0633]  Recent Labs    02/13/20 0738 02/14/20 0552 02/15/20 0635  GLUCAP 109* 88 103*     Pertinent Labs: CBC Latest Ref Rng & Units 02/14/2020 02/13/2020 02/11/2020  WBC 4.0 - 10.5 K/uL 8.3 7.8 7.0  Hemoglobin 13.0 - 17.0 g/dL 8.4(L) 8.4(L) 7.9(L)  Hematocrit 39.0 - 52.0 % 29.0(L) 29.3(L) 26.9(L)  Platelets 150 - 400 K/uL 160 176 185    CMP Latest Ref Rng & Units 02/15/2020 02/14/2020 02/13/2020  Glucose 70 - 99 mg/dL 104(H) 109(H) 105(H)  BUN 6 - 20 mg/dL 125(H) 88(H) 95(H)  Creatinine 0.61 - 1.24 mg/dL 13.59(H) 11.53(H) 12.41(H)  Sodium 135 - 145 mmol/L 139 139 138   Potassium 3.5 - 5.1 mmol/L 5.4(H) 4.8 5.1  Chloride 98 - 111 mmol/L 101 102 99  CO2 22 - 32 mmol/L '23 24 26  '$ Calcium 8.9 - 10.3 mg/dL 9.0 8.7(L) 9.0  Total Protein 6.5 - 8.1 g/dL - - -  Total Bilirubin 0.3 - 1.2 mg/dL - - -  Alkaline Phos 38 - 126 U/L - - -  AST 15 - 41 U/L - - -  ALT 0 - 44 U/L - - -   Imaging: No results found.  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 male with hx of ESRD on MWF HD, HTN, CAD, severe aortic stenosis, spina bifida, chronic opioid use, L knee septic arthritis who presented with epistaxis, abscess near L thigh fistula, and malodorous discharge from chronic wound due to prior L nephrostomy tube.  Now status post I&D of abscess and exchange of right femoral catheter.  Pending placement of wound VAC on  discharge home with home health.  Left Thigh Abscess S/p removal of L thigh infected AVG and I&D of abscess on 1/28 and completion of 5-day course of Vancomycin. No signs of infection so far. Plan for wound VAC placement today. --Wound VAC placement per Vascular, will f/u recs --Continue TED hose --Outpatient follow up with vascular  Acute on Chronic Pain 2/2 refractory gout and recent thigh I&D with open wound R shoulder effusion & hemarthrosis  Ortho consulted this admission due to right shoulder hemarthrosis. Pain control has been a challenge during this admission. Fentanyl patch increased to 100 mcg yesterday. Patient will benefit from palliative care assistance with his pain control and possible [place] conversation. --Continue fentanyl patch 100 mcg --Wean off IV dilaudid slowly (2>1.5>1>0.5>0) after wound VAC placement --Continue OxyIR  10 mg q6h and percocet 1 tab q6h --Prednisone 40 mg (day 12 of 14) --IV Phenergan 6.25 mg q4h --Palliative Care consulted to assistance with pain management, appreciate assistance.   ESRD on MWF dialysis End Stage Access (R fem TDC) R femoral HD catheter exchanged on 2/4. Plan for longer duration of HD today to promote clearance of azotemia. K+ elevated to 5.8 today.  --HD today --Continue ASA and plavix --Daily RFP --Continue Kayexalate (Sat, Sun), giving an additional dose today.   Spontaneous Ecchymosis 2/2 to DAPT and uremic platelet dysfunction  Epistaxis - resolved AoC Anemia 2/2 CKD and IDA Thrombocytopenia, resolved Has required multiple transfusions this admission as well as IV iron. Hgb improved to 9.1 today. Platelet stable at 156. No signs of active bleed.  --Continue ASA and plavix to help avoid HD catheter clotting --Monitor with daily CBCs  Left flank sinus tract: Dressing displaced today during encounter. Mild bleeding from the tract. RN notified to address and put on a new dressing. Pt will follow up with Urology in the outpatient.  Vitamin D deficiency Metabolic Bone Disease Secondary Hyperparathryoidism Continue vitamin D supplementation.  Diet: Renal diet IVF: PO intake VTE: Place TED hose Start: 02/08/20 1449 SCDs Start: 01/28/20 0551 Code: Full PT/OT: Home Health with PT ID:  Anti-infectives (From admission, onward)   Start     Dose/Rate Route Frequency Ordered Stop   02/02/20 1200  vancomycin (VANCOCIN) IVPB 1000 mg/200 mL premix  Status:  Discontinued        1,000 mg 200 mL/hr over 60 Minutes Intravenous Every M-W-F (Hemodialysis) 02/01/20 1018 02/01/20 1024   01/30/20 1200  vancomycin (VANCOCIN) IVPB 1000 mg/200 mL premix  Status:  Discontinued        1,000 mg 200 mL/hr over 60 Minutes Intravenous Every M-W-F (Hemodialysis) 01/28/20 2202 02/01/20 1018   01/29/20 1200  vancomycin (VANCOCIN) IVPB 1000 mg/200 mL premix        1,000 mg 200 mL/hr over  60 Minutes Intravenous Every T-Th-Sa (Hemodialysis) 01/28/20 2202 01/31/20 1159   01/29/20 1127  vancomycin (VANCOCIN) 1-5 GM/200ML-% IVPB       Note to Pharmacy: [name]   : cabinet override      01/29/20 1127 01/29/20 2329   01/28/20 1200  vancomycin (VANCOCIN) IVPB 750 mg/150 ml premix  Status:  Discontinued        750 mg 150 mL/hr over 60 Minutes Intravenous Every M-W-F (Hemodialysis) 01/28/20 1119 01/28/20 2202   01/28/20 0900  vancomycin (VANCOCIN) 2,500 mg in sodium chloride 0.9 % 500 mL IVPB        2,500 mg 250 mL/hr over 120 Minutes Intravenous  Once 01/28/20 0828 01/28/20 1221   01/28/20 0400  vancomycin (VANCOCIN) 2,500 mg in sodium chloride 0.9 % 500 mL IVPB  Status:  Discontinued        2,500 mg 250 mL/hr over 120 Minutes Intravenous  Once 01/28/20 0353 01/28/20 1530   01/28/20 0400  ceFEPIme (MAXIPIME) 2 g in sodium chloride 0.9 % 100 mL IVPB        2 g 200 mL/hr over 30 Minutes Intravenous  Once 01/28/20 0353 01/28/20 0646      Anticipated discharge to Home  On Monday  pending wound VAC placement.  Lacinda Axon, MD 02/16/2020, 6:33 AM Pager: [facility]  Please contact the on call pager after 5 pm and on weekends at [phone].

## 2020-02-16 NOTE — Consult Note (Signed)
WOC Nurse Consult Note: Called Vein and Vascular Office and requested to speak with PA S. Setzer; she was not in the office. I was given pager number [phone] to reach her. Paged, awaiting opportunity to speak with her about the [facility] order. Patient remains in HD where it is not possible to place the [facility] therapy. Val Riles, RN, MSN, CWOCN, CNS-BC, pager [phone]

## 2020-02-16 NOTE — Consult Note (Signed)
WOC Nurse Consult Note: Patient receiving care in 2W23.   Reason for Consult: VAC placement to left groin Wound type: surgical Supplies for [facility] placement to left groin did not arrive prior to patient being taken to HD.  I spoke with HD RN.  They will not allow placement of wound VAC in HD due to infection control risks.  I have sent a Secure Chat message to PA S. Setzer explaining the delay in application of the VAC. Val Riles, RN, MSN, CWOCN, CNS-BC, pager [phone]

## 2020-02-16 NOTE — Progress Notes (Signed)
   02/16/20 1130  Clinical Encounter Type  Visited With Patient  Visit Type Initial  Referral From Nurse  Consult/Referral To Chaplain  Chaplain responded to consultation. The patient was not in his room. Chaplain will follow up to provide Advanced Directive education. This note was prepared by Jeanine Luz, M.Div..  For questions please contact by phone [phone].

## 2020-02-16 NOTE — Progress Notes (Addendum)
Patient ID: [name], male   DOB: [birth year], 55 y.o.   MRN: [id number] [facility] Progress Note   Assessment/ Plan:   1. Left thigh AV graft explantation secondary to infection: Status post removal of infected thigh graft material with transient antibiotic use negative Intra-Op wound cultures. He remains afebrile with improving wound appearance-plan noted for wound VAC placement today. 2. ESRD: He is on a Monday/Wednesday/Friday dialysis schedule and has hemodialysis ordered again for today (longer duration to promote ultrafiltration/clearance of azotemia). Hemodialysis currently being undertaken with right femoral dialysis catheter that was exchanged on 2/4. 3. Anemia: With low hemoglobin and hematocrit likely secondary to inflammatory complex with infected type right/left flank sinus tract. Continue high-dose ESA status post IV iron. No indications for PRBC at this time. 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 Nutrition: Continue renal diet with ongoing protein supplementation. 6. Hypertension: Blood pressure marginally elevated-anticipate will improve with hemodialysis. Lower extremity edema noted.  Subjective:   Reports to be feeling a little short of breath with a sensation that his belly is tighter-consistent with volume excess in the past. He continues to have some drainage from his left flank sinus site.   Objective:   BP (!) 144/77   Pulse 64   Temp 98.1 F (36.7 C)   Resp 18   Wt 120 kg   SpO2 100%   BMI 36.90 kg/m   Physical Exam: Gen: Appears uncomfortable resting in bed CVS: Pulse regular rhythm, normal rate, S1 and S2 normal Resp: Decreased breath sounds over bases, no distinct rales or rhonchi Abd: Soft, obese, nontender Ext: 2+ right lower extremity edema with ipsilateral femoral catheter  Labs: BMET Recent Labs  Lab 02/10/20 0258  02/11/20 0148 02/12/20 0322 02/13/20 0303 02/14/20 0429 02/15/20 0400  NA 138 140 137 138 139 139  K 4.5 4.5 4.4 5.1 4.8 5.4*  CL 98 99 97* 99 102 101  CO2 '26 26 26 26 24 23  '$ GLUCOSE 132* 112* 124* 105* 109* 104*  BUN 73* 86* 77* 95* 88* 125*  CREATININE 10.89* 12.67* 11.33* 12.41* 11.53* 13.59*  CALCIUM 8.7* 8.8* 8.6* 9.0 8.7* 9.0  PHOS 3.9 4.4 3.7 4.1 3.7 4.4   CBC Recent Labs  Lab 02/10/20 0258 02/11/20 0148 02/13/20 0637 02/14/20 0429  WBC 6.7 7.0 7.8 8.3  HGB 7.9* 7.9* 8.4* 8.4*  HCT 25.2* 26.9* 29.3* 29.0*  MCV 95.1 98.9 101.4* 102.5*  PLT 170 185 176 160     Medications:    . (feeding supplement) PROSource Plus  30 mL Oral TID BM  . aspirin EC  81 mg Oral Daily  . calcium acetate  2,668 mg Oral TID WC  . Chlorhexidine Gluconate Cloth  6 each Topical Q0600  . cholecalciferol  1,000 Units Oral Daily  . cinacalcet  30 mg Oral Q M,W,F-HD  . clopidogrel  75 mg Oral Daily  . doxercalciferol  4 mcg Intravenous Q M,W,F-HD  . famotidine  40 mg Oral Daily  . fentaNYL  1 patch Transdermal Q72H  . levothyroxine  137 mcg Oral Q0600  . melatonin  10 mg Oral QHS  . multivitamin  1 tablet Oral QHS  . oxyCODONE  10 mg Oral Q6H   And  . oxyCODONE-acetaminophen  1 tablet Oral Q6H  . predniSONE  40 mg Oral Q breakfast  . sodium chloride flush  3 mL Intravenous Q12H  . sodium polystyrene  15  g Oral Once per day on Sun Sat  . traZODone  100 mg Oral QHS  . zolpidem  10 mg Oral QHS   Elmarie Shiley, MD 02/16/2020, 9:10 AM

## 2020-02-16 NOTE — Procedures (Signed)
Patient seen on Hemodialysis. BP (!) 100/50 (BP Location: Right Arm)   Pulse 64   Temp 98.1 F (36.7 C) (Oral)   Resp 18   Wt 123.2 kg   SpO2 97%   BMI 37.88 kg/m   QB 300, UF goal 5.3L Tolerating treatment without complaints at this time.   Elmarie Shiley MD [facility]. Office # [phone] Pager # [phone] 12:03 PM

## 2020-02-16 NOTE — Progress Notes (Signed)
PT Cancellation Note  Patient Details Name: [name] MRN: [id number] DOB: [birth year]   Cancelled Treatment:    Reason Eval/Treat Not Completed: Patient at procedure or test/unavailable (HD)   Omarius Grantham B Teller [place] 02/16/2020, 11:10 AM  Bayard Males, PT Acute Rehabilitation Services Pager: [phone] Office: [phone]

## 2020-02-16 NOTE — Plan of Care (Signed)
  Problem: Clinical Measurements: Goal: Cardiovascular complication will be avoided Outcome: Progressing   Problem: Nutrition: Goal: Adequate nutrition will be maintained Outcome: Progressing   Problem: Coping: Goal: Level of anxiety will decrease Outcome: Progressing   Problem: Safety: Goal: Ability to remain free from injury will improve Outcome: Progressing

## 2020-02-17 DIAGNOSIS — D691 Qualitative platelet defects: Secondary | ICD-10-CM | POA: Diagnosis not present

## 2020-02-17 DIAGNOSIS — N186 End stage renal disease: Secondary | ICD-10-CM | POA: Diagnosis not present

## 2020-02-17 DIAGNOSIS — M10312 Gout due to renal impairment, left shoulder: Secondary | ICD-10-CM | POA: Diagnosis not present

## 2020-02-17 DIAGNOSIS — Z515 Encounter for palliative care: Secondary | ICD-10-CM

## 2020-02-17 DIAGNOSIS — L02416 Cutaneous abscess of left lower limb: Secondary | ICD-10-CM | POA: Diagnosis not present

## 2020-02-17 DIAGNOSIS — T829XXD Unspecified complication of cardiac and vascular prosthetic device, implant and graft, subsequent encounter: Secondary | ICD-10-CM | POA: Diagnosis not present

## 2020-02-17 DIAGNOSIS — Z7189 Other specified counseling: Secondary | ICD-10-CM

## 2020-02-17 DIAGNOSIS — Z992 Dependence on renal dialysis: Secondary | ICD-10-CM

## 2020-02-17 LAB — RENAL FUNCTION PANEL
Albumin: 3.3 g/dL — ABNORMAL LOW (ref 3.5–5.0)
Anion gap: 15 (ref 5–15)
BUN: 96 mg/dL — ABNORMAL HIGH (ref 6–20)
CO2: 24 mmol/L (ref 22–32)
Calcium: 8.5 mg/dL — ABNORMAL LOW (ref 8.9–10.3)
Chloride: 98 mmol/L (ref 98–111)
Creatinine, Ser: 10.86 mg/dL — ABNORMAL HIGH (ref 0.61–1.24)
GFR, Estimated: 5 mL/min — ABNORMAL LOW (ref >60)
Glucose, Bld: 95 mg/dL (ref 70–99)
Phosphorus: 4.5 mg/dL (ref 2.5–4.6)
Potassium: 4.7 mmol/L (ref 3.5–5.1)
Sodium: 137 mmol/L (ref 135–145)

## 2020-02-17 LAB — GLUCOSE, CAPILLARY: Glucose-Capillary: 70 mg/dL (ref 70–99)

## 2020-02-17 MED ORDER — PROMETHAZINE HCL 25 MG/ML IJ SOLN
12.5000 mg | Freq: Once | INTRAMUSCULAR | Status: AC
  Filled 2020-02-17: qty 1

## 2020-02-17 MED ORDER — ALTEPLASE 2 MG IJ SOLR: 4.0000 mg | Freq: Once | INTRAMUSCULAR | Status: AC

## 2020-02-17 MED ORDER — HEPARIN SODIUM (PORCINE) 1000 UNIT/ML DIALYSIS
40.0000 [IU]/kg | INTRAMUSCULAR | Status: DC | PRN
  Filled 2020-02-17: qty 5

## 2020-02-17 MED ORDER — ALTEPLASE 2 MG IJ SOLR
INTRAMUSCULAR | Status: AC
  Administered 2020-02-17: 4 mg
  Filled 2020-02-17: qty 4

## 2020-02-17 MED ORDER — HYDROMORPHONE HCL 1 MG/ML IJ SOLN
0.0000 mg | INTRAMUSCULAR | Status: AC | PRN
  Administered 2020-02-17 (×2): 2 mg via INTRAVENOUS
  Administered 2020-02-17: 1.5 mg via INTRAVENOUS
  Administered 2020-02-18 (×3): 1 mg via INTRAVENOUS
  Administered 2020-02-18: 1.5 mg via INTRAVENOUS
  Administered 2020-02-19: 1 mg via INTRAVENOUS
  Filled 2020-02-17: qty 2
  Filled 2020-02-17 (×2): qty 1
  Filled 2020-02-17 (×2): qty 2
  Filled 2020-02-17 (×2): qty 1
  Filled 2020-02-17: qty 2

## 2020-02-17 MED ORDER — HYDROMORPHONE HCL 1 MG/ML IJ SOLN
INTRAMUSCULAR | Status: AC
  Filled 2020-02-17: qty 2

## 2020-02-17 MED ORDER — PROMETHAZINE HCL 25 MG/ML IJ SOLN
INTRAMUSCULAR | Status: AC
  Administered 2020-02-17: 12.5 mg via INTRAVENOUS
  Filled 2020-02-17: qty 1

## 2020-02-17 MED ORDER — POLYETHYLENE GLYCOL 3350 17 G PO PACK
17.0000 g | PACK | Freq: Every day | ORAL | Status: DC
  Administered 2020-02-17 – 2020-02-21 (×5): 17 g via ORAL
  Filled 2020-02-17 (×5): qty 1

## 2020-02-17 NOTE — Consult Note (Addendum)
Consultation Note Date: 02/17/2020   Patient Name: [name]  DOB: [birth year]  MRN: [id number]  Age / Sex: 55 y.o., male  PCP: [name], MD Referring Physician: Velna Ochs, MD  Reason for Consultation: Establishing goals of care and Pain control  HPI/Patient Profile: 55 y.o. male  with past medical history of ESRD on dialysis, left thigh abscess s/p I&D, spina bifida/myelomingocele, severe AS, PAD, CAD, HTN, nephrostomy with complication, recurrent right knee hemarthrosis, septic arthritis of left knee, gout, anxiety, hypothyroidism, anemia, and left flank wound admitted on 01/27/2020 with left thigh abscess and complication with HD catheter.   Patient is faced with difficult treatment options, decision making, and advance care planning. Palliative care consulted to assist with pain management and determining goals of care.  Clinical Assessment and Goals of Care:  This PA Josseline Cooper reviewed medical records, received report from team, assessed the patient and met at the patient's bedside to discuss diagnosis, prognosis, GOC, EOL wishes, disposition and options.  Concept of Palliative Care wasintroduced as specialized medical care for people and their families living with serious illness.  It focuses on providing relief from the symptoms and stress of a serious illness.  The goal is to improve quality of life for both the patient and the family.   [name] life events and coping strategies were discussed at length. He states that he has been sick "his entire life" since he was born with spina bifida and received a urostomy and ileal conduit at age 50. He currently lives with his step-sister [name] and states that she has health problems of her own. He notes a contrast in how they view their respective illnesses, stating that she often thinks negatively while he prefers to find joy  in meaningful experiences. He enjoys anime, Engineer, site, and Research scientist (life sciences) fiction. Other coping strategies include his faith and sense of humor. He orders delivery for his groceries and cooks his own meals. He uses a walker and wheelchair to ambulate. He strongly feels that he has good quality of life "as long as I can use my mind and my hands."  Created space and opportunity for patient to explore thoughts and feelings regarding current medical information. He states that he has been hospitalized multiple times in the past few months, most recently due to issues with his dialysis access. He has been receiving dialysis for many years and states that his graft failed around 8 years ago, at which time he was hoping to prolong his life for another 5-10 years and was told that he may have another 5 years. He understands that he has a serious illness and acknowledges having only three options for further dialysis access if the catheter fails (through the liver, a transplant, and contacting a physician in [facility]). He notes familiarity with difficult decision making - his mother, father, and sister died within a span of 4 years. He reports procrastination/hesistancy with completing ACP documentation due to a bad experience at [facility] physical therapy, where he states that a DNR  order was placed without his consent.  Values and goals of care important to patient and family were attempted to be elicited.  Questions and concerns addressed. Patient wasencouraged to call with questions or concerns.   PMT will continue to support holistically.   A MOST form was introduced. Scope of treatment decisions such as CPR, medical interventions, antibiotics, IV fluids/artificial nutrition, and feeding tubes were discussed.   There is no documented HCPOA or advance care planning documentation. Will continue to discuss with patient regarding the importance of completing during this hospitalization. Patient has  difficulty naming any one individual as HCPOA at this time, but emphasizes that he would not want life-prolonging measures if he was "paralyzed from the neck down."  PATIENT is primary decision maker    Discussed with Wadie Lessen NP, RN/Colleen, Dr. Posey Pronto, LCSW/Colleen, and Case Manager/Ella.   SUMMARY OF RECOMMENDATIONS   -Full code clearly desired, voided prior DNR document in [facility]  -MOST form introduced -Continue discussions regarding advance care planning -Continue symptom management as below (Fentanyl patch increased, wean IV Dilaudid) -Plan is to d/c to home with home health -Recommend outpatient palliative care services  Code Status/Advance Care Planning:  Full code    Symptom Management:   Fentanyl patch 140mg  OxyIR 10 mg q6h and percocet 1 tab q6h  Prednisone 40 mg (day 12 of 14)  IV Phenergan 6.25 mg q4h  Palliative Prophylaxis:   Frequent Pain Assessment and Palliative Wound Care  Additional Recommendations (Limitations, Scope, Preferences):  Full Scope Treatment  Psycho-social/Spiritual:   Desire for further Chaplaincy support:yes  Additional Recommendations: Referral to Community Resources   Prognosis:   Unable to determine  Discharge Planning: Home with Home Health      Primary Diagnoses: Present on Admission: . Abscess of left thigh . End stage renal disease ([place] . P[place] artery disease) ([place] . Nephrostomy complication ([facility]   I have reviewed the medical record, interviewed the patient and family, and examined the patient. The following aspects are pertinent.  Past Medical History:  Diagnosis Date  . Anemia   . Anxiety   . Clotted dialysis access ([facility] 01/06/2020  . Constipation   . End stage renal failure on dialysis ([facility]    [place], on HD since 1997, exhausted upper ext access, and possibly LE access; cath dependent in R groin as of 06/2018  . GERD (gastroesophageal reflux disease)   . Grand mal seizure  ([facility] X 4   last 1998 (02/29/2016)  . History of blood transfusion 2000s   "I was having blood loss; never found out from where"  . History of kidney stones   . Hypertension    hx of - has not taken bp meds in over 2 years  since dialysis  . Hypothyroidism   . Insomnia   . Migraine    "due to my BP in my late 1990s; none since" (02/29/2016)  . Nonhealing surgical wound    of the left arteriovenous graft  . Severe aortic stenosis    no problems with per pt. - nephrologist and Dr. BColetta Memos . Spina bifida ([facility]    Social History   Socioeconomic History  . Marital status: Single    Spouse name: Not on file  . Number of children: 0  . Years of education: Not on file  . Highest education level: Not on file  Occupational History  . Occupation: Disabled  Tobacco Use  . Smoking status: Never Smoker  . Smokeless tobacco: Never  Used  Vaping Use  . Vaping Use: Never used  Substance and Sexual Activity  . Alcohol use: Not Currently    Alcohol/week: 0.0 standard drinks    Comment: 02/29/2016 "nothing since  the mid 1990s  . Drug use: Not Currently    Types: Marijuana    Comment: 02/29/2016 "nothing since ~ 1995"  . Sexual activity: Yes  Other Topics Concern  . Not on file  Social History Narrative   Lives with sister    Social Determinants of Health   Financial Resource Strain: Not on file  Food Insecurity: Not on file  Transportation Needs: Not on file  Physical Activity: Not on file  Stress: Not on file  Social Connections: Not on file   Family History  Problem Relation Age of Onset  . Diabetes Mother   . Hypertension Mother   . Heart disease Mother        before age 89  . Diabetes Father   . Heart attack Father        X's 3  . Diabetes Sister   . Bipolar disorder Sister    Scheduled Meds: . (feeding supplement) PROSource Plus  30 mL Oral TID BM  . aspirin EC  81 mg Oral Daily  . calcium acetate  2,668 mg Oral TID WC  . Chlorhexidine Gluconate Cloth  6 each  Topical Q0600  . cholecalciferol  1,000 Units Oral Daily  . cinacalcet  30 mg Oral Q M,W,F-HD  . clopidogrel  75 mg Oral Daily  . doxercalciferol  4 mcg Intravenous Q M,W,F-HD  . famotidine  40 mg Oral Daily  . fentaNYL  1 patch Transdermal Q72H  . levothyroxine  137 mcg Oral Q0600  . melatonin  10 mg Oral QHS  . multivitamin  1 tablet Oral QHS  . oxyCODONE  10 mg Oral Q6H   And  . oxyCODONE-acetaminophen  1 tablet Oral Q6H  . predniSONE  40 mg Oral Q breakfast  . sodium chloride flush  3 mL Intravenous Q12H  . sodium polystyrene  15 g Oral Once  . sodium polystyrene  15 g Oral Once per day on Sun Sat  . traZODone  100 mg Oral QHS  . zolpidem  10 mg Oral QHS   Continuous Infusions: . sodium chloride     PRN Meds:.sodium chloride, antiseptic oral rinse, fluticasone, HYDROmorphone (DILAUDID) injection, HYDROmorphone (DILAUDID) injection, promethazine, senna, sodium chloride flush Medications Prior to Admission:  Prior to Admission medications   Medication Sig Start Date End Date Taking? Authorizing Provider  acetaminophen (TYLENOL) 500 MG tablet Take 1,000 mg by mouth every 6 (six) hours as needed for mild pain or headache.   Yes [provider]  aspirin 325 MG tablet Take 325 mg by mouth daily.   Yes [provider]  calcium acetate (PHOSLO) 667 MG capsule Take 3-4 capsules (2,001-2,668 mg total) by mouth See admin instructions. Take 2708 mg by mouth 3 times daily with meals, take 2001 mg by mouth with snacks Patient taking differently: No sig reported 01/14/18  Yes Hennie Duos, MD  clopidogrel (PLAVIX) 75 MG tablet Take 75 mg by mouth daily.   Yes [provider]  diclofenac Sodium (VOLTAREN) 1 % GEL Apply 2 g topically daily as needed (For leg pain).   Yes [provider]  escitalopram (LEXAPRO) 5 MG tablet Take 5 mg by mouth daily.   Yes [provider]  famotidine (PEPCID) 40 MG tablet Take 40 mg by mouth daily.  09/01/10  Yes  [provider]  fentaNYL (DURAGESIC) 75 MCG/HR Place 1 patch onto the skin See admin instructions. Change every 72 hours. 01/27/20 01/26/21 Yes [provider]  fluticasone (FLONASE) 50 MCG/ACT nasal spray Place 2 sprays into both nostrils as needed for allergies or rhinitis (congestion). Patient taking differently: Place 2 sprays into both nostrils daily as needed for allergies or rhinitis (congestion). 01/14/18  Yes Hennie Duos, MD  furosemide (LASIX) 80 MG tablet Take 80 mg by mouth 2 (two) times daily as needed for fluid.    Yes [provider]  levothyroxine (SYNTHROID) 137 MCG tablet Take 137 mcg by mouth daily before breakfast.   Yes [provider]  lidocaine (LIDODERM) 5 % Place 2 patches onto the skin daily. 01/27/20  Yes [provider]  LORazepam (ATIVAN) 1 MG tablet Take 1 mg by mouth daily.   Yes [provider]  Melatonin 10 MG TABS Take 10 mg by mouth at bedtime.   Yes [provider]  Nutritional Supplements (NEPRO) LIQD Take 237 mLs by mouth daily. Mixed Berry   Yes [provider]  oxyCODONE-acetaminophen (PERCOCET) 10-325 MG tablet Take 1 tablet by mouth every 6 (six) hours as needed for pain. Patient taking differently: Take 1 tablet by mouth every 6 (six) hours. 06/14/18  Yes Dagoberto Ligas, PA-C  promethazine (PHENERGAN) 25 MG tablet Take 1 tablet (25 mg total) by mouth 2 (two) times daily as needed for nausea or vomiting. 01/14/18  Yes Hennie Duos, MD  rosuvastatin (CRESTOR) 10 MG tablet Take 1 tablet (10 mg total) by mouth daily. 10/18/19 12/09/19 Yes Brimage, Vondra, DO  sodium polystyrene (KAYEXALATE) powder Take 15 g by mouth See admin instructions. Take 15 g (mixed in water) by mouth on Saturdays and Sundays 01/14/18  Yes Hennie Duos, MD  traZODone (DESYREL) 100 MG tablet Take 1 tablet (100 mg total) by mouth at bedtime. 01/14/18  Yes Hennie Duos, MD  zolpidem (AMBIEN) 10 MG tablet  Take 1 tablet (10 mg total) by mouth at bedtime. 01/14/18  Yes Hennie Duos, MD   Allergies  Allergen Reactions  . Hydrocodone Other (See Comments)    Caused involuntary movement and twitching. CANNOT TAKE DUE TO MUSCLE SPASMS AND MUSCLE TREMORS  . Furadantin [Nitrofurantoin] Other (See Comments)    UNSPECIFIED REACTION   . Mandelamine [Methenamine] Other (See Comments)    UNSPECIFIED REACTION   . Noroxin [Norfloxacin] Other (See Comments)    UNSPECIFIED REACTION   . Colace [Docusate] Diarrhea and Nausea And Vomiting  . Elemental Sulfur Cough    Childhood reaction - pt could not confirm that it was a cough  . Sulfa Antibiotics Other (See Comments) and Cough    Childhood reaction - pt could not confirm that it was a cough   Review of Systems  Respiratory: Positive for shortness of breath.   Cardiovascular: Positive for leg swelling.  All other systems reviewed and are negative.   Physical Exam Vitals and nursing note reviewed.  Constitutional:      General: He is not in acute distress. Cardiovascular:     Rate and Rhythm: Normal rate.  Pulmonary:     Effort: Pulmonary effort is normal.  Abdominal:     Comments: Ileal conduit intact and dry.  Neurological:     Mental Status: He is alert and oriented to person, place, and time.  Psychiatric:        Attention and Perception: Attention and perception normal.  Mood and Affect: Mood normal.        Behavior: Behavior is cooperative.     Vital Signs: BP 129/62 (BP Location: Right Arm)   Pulse 80   Temp 98.3 F (36.8 C) (Oral)   Resp 16   Wt 118.4 kg   SpO2 100%   BMI 36.41 kg/m  Pain Scale: 0-10 POSS *See Group Information*: 1-Acceptable,Awake and alert Pain Score: 6    SpO2: SpO2: 100 % O2 Device:SpO2: 100 % O2 Flow Rate: .O2 Flow Rate (L/min): 3 L/min  IO: Intake/output summary:   Intake/Output Summary (Last 24 hours) at 02/17/2020 7416 Last data filed at 02/17/2020 3845 Gross per 24 hour  Intake 3  ml  Output 4800 ml  Net -4797 ml    LBM: Last BM Date: 02/14/20 Baseline Weight: Weight: 122.4 kg Most recent weight: Weight: 118.4 kg     Palliative Assessment/Data:     Time In: 8:45am Time Out: 9:55am Time Total: 70 minutes   Greater than 50% of this time was spent counseling and coordinating care related to the above assessment and plan.  Signed by: Norberta Keens, PA-C   Please contact Palliative Medicine Team phone at [phone] for questions and concerns.  For individual provider: See Shea Evans

## 2020-02-17 NOTE — Progress Notes (Signed)
Physical Therapy Treatment Patient Details Name: [name] MRN: [id number] DOB: 1965-03-04 Today's Date: 02/17/2020    History of Present Illness 55 y.o male admitted with left abscess s/p I&D with removal dialysis graft. S/p tunneled catheter exchange 02/06/20 this admission; Hx of End Stage Renal Disease on HD, PAD, nephrostomy complication.    PT Comments    Patient nearly met all goals (walking 45 ft at a time with minguard assist vs goal of 50 ft modified independent). Patient anticipates he will discharge home tomorrow after HD. Goal update due and updated in case discharge is changed.     Follow Up Recommendations  Home health PT     Equipment Recommendations  None recommended by PT    Recommendations for Other Services       Precautions / Restrictions Precautions Precautions: Fall    Mobility  Bed Mobility Overal bed mobility: Independent Bed Mobility: Supine to Sit     Supine to sit: Independent          Transfers Overall transfer level: Modified independent Equipment used: Rolling walker (2 wheeled) Transfers: Sit to/from Stand Sit to Stand: Modified independent (Device/Increase time)         General transfer comment: depends on LLE to stand which is awkward due to his leg length difference  Ambulation/Gait Ambulation/Gait assistance: Supervision Gait Distance (Feet): 45 Feet (x 3 with seated rests between) Assistive device: Rolling walker (2 wheeled) Gait Pattern/deviations: Step-through pattern;Decreased stride length;Trunk flexed;Wide base of support;Antalgic;Decreased weight shift to right;Decreased step length - left;Decreased dorsiflexion - right Gait velocity: decreased   General Gait Details: reliant on LLE for support but RLE is longer, but in pain.  Awkward wgt shift as a result   Stairs             Wheelchair Mobility    Modified Rankin (Stroke Patients Only)       Balance Overall balance assessment: Needs  assistance Sitting-balance support: Feet supported Sitting balance-Leahy Scale: Good Sitting balance - Comments: able to reach >6" outside BOS   Standing balance support: Bilateral upper extremity supported Standing balance-Leahy Scale: Poor Standing balance comment: RW reliance to mintain comfort of RLE                            Cognition Arousal/Alertness: Awake/alert Behavior During Therapy: WFL for tasks assessed/performed Overall Cognitive Status: Within Functional Limits for tasks assessed                                        Exercises      General Comments        Pertinent Vitals/Pain Pain Assessment: Faces Faces Pain Scale: Hurts even more Pain Location: R groin at sugical site Pain Descriptors / Indicators: Discomfort;Grimacing;Guarding;Operative site guarding Pain Intervention(s): Limited activity within patient's tolerance;Premedicated before session;Repositioned    Home Living                      Prior Function            PT Goals (current goals can now be found in the care plan section) Acute Rehab PT Goals Patient Stated Goal: Get well Time For Goal Achievement: 03/02/20 Potential to Achieve Goals: Good Progress towards PT goals: Goals met and updated - see care plan (step goal deferred by pt)    Frequency  Min 3X/week      PT Plan Current plan remains appropriate    Co-evaluation              AM-PAC PT "6 Clicks" Mobility   Outcome Measure  Help needed turning from your back to your side while in a flat bed without using bedrails?: None Help needed moving from lying on your back to sitting on the side of a flat bed without using bedrails?: None Help needed moving to and from a bed to a chair (including a wheelchair)?: None Help needed standing up from a chair using your arms (e.g., wheelchair or bedside chair)?: A Little Help needed to walk in hospital room?: A Little Help needed climbing  3-5 steps with a railing? : A Lot 6 Click Score: 20    End of Session   Activity Tolerance: Patient limited by pain (pt wanted to walk a 4th lap as planned, but reported in too much pain) Patient left: in chair;with call bell/phone within reach   PT Visit Diagnosis: Other abnormalities of gait and mobility (R26.89);Muscle weakness (generalized) (M62.81);Difficulty in walking, not elsewhere classified (R26.2);Pain;Unsteadiness on feet (R26.81) Pain - Right/Left: Right Pain - part of body: Leg     Time: 8206-0156 PT Time Calculation (min) (ACUTE ONLY): 28 min  Charges:  $Gait Training: 23-37 mins                      Arby Barrette, PT Pager [phone]    Rexanne Mano 02/17/2020, 1:46 PM

## 2020-02-17 NOTE — Progress Notes (Signed)
Patient ID: [name], male   DOB: [birth year], 55 y.o.   MRN: [id number] [facility] Progress Note   Assessment/ Plan:   1. Left thigh AV graft explantation secondary to infection: Status post removal of infected thigh graft material with transient antibiotic use negative Intra-Op wound cultures.  Afebrile and clear today status post wound VAC placement.  Stable enough from vascular surgery standpoint to discharge home with outpatient wound care. 2. ESRD: He is on a Monday/Wednesday/Friday dialysis schedule and has additional hemodialysis ordered again for today (for UF/azotemia clearance. Hemodialysis to continue via right femoral dialysis catheter that was exchanged on 2/4.  He may be able to be discharged home later today if dialysis can be done earlier (alternatively, would be better off discharging him home with plans for outpatient dialysis resuming tomorrow). 3. Anemia: With low hemoglobin and hematocrit likely secondary to inflammatory complex with infected type right/left flank sinus tract. Continue high-dose ESA status post IV iron. No indications for PRBC at this time. 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 Nutrition: Continue renal diet with ongoing protein supplementation. 6. Hypertension: Blood pressure marginally elevated-anticipate will improve with hemodialysis. Lower extremity edema noted.  Subjective:   With intermittent drainage from the left flank sinus site overnight.  Tolerated hemodialysis and requests additional treatment today for volume/clearance.   Objective:   BP 129/62 (BP Location: Right Arm)   Pulse 80   Temp 98.3 F (36.8 C) (Oral)   Resp 16   Wt 118.4 kg   SpO2 100%   BMI 36.41 kg/m   Physical Exam: Gen: Comfortably resting in bed, palliative care team speaking to him CVS: Pulse regular rhythm, normal rate, S1 and S2  normal Resp: Decreased breath sounds over bases, no distinct rales or rhonchi Abd: Soft, obese, nontender Ext: 2+ right lower extremity edema with ipsilateral femoral catheter  Labs: BMET Recent Labs  Lab 02/11/20 0148 02/12/20 0322 02/13/20 0303 02/14/20 0429 02/15/20 0400 02/16/20 1120 02/17/20 0145  NA 140 137 138 139 139 138 137  K 4.5 4.4 5.1 4.8 5.4* 5.8* 4.7  CL 99 97* 99 102 101 99 98  CO2 '26 26 26 24 23 22 24  '$ GLUCOSE 112* 124* 105* 109* 104* 123* 95  BUN 86* 77* 95* 88* 125* 158* 96*  CREATININE 12.67* 11.33* 12.41* 11.53* 13.59* 15.44* 10.86*  CALCIUM 8.8* 8.6* 9.0 8.7* 9.0 9.1 8.5*  PHOS 4.4 3.7 4.1 3.7 4.4 6.6* 4.5   CBC Recent Labs  Lab 02/11/20 0148 02/13/20 0637 02/14/20 0429 02/16/20 1120  WBC 7.0 7.8 8.3 12.2*  HGB 7.9* 8.4* 8.4* 9.1*  HCT 26.9* 29.3* 29.0* 31.4*  MCV 98.9 101.4* 102.5* 103.0*  PLT 185 176 160 156     Medications:    . (feeding supplement) PROSource Plus  30 mL Oral TID BM  . aspirin EC  81 mg Oral Daily  . calcium acetate  2,668 mg Oral TID WC  . Chlorhexidine Gluconate Cloth  6 each Topical Q0600  . cholecalciferol  1,000 Units Oral Daily  . cinacalcet  30 mg Oral Q M,W,F-HD  . clopidogrel  75 mg Oral Daily  . doxercalciferol  4 mcg Intravenous Q M,W,F-HD  . famotidine  40 mg Oral Daily  . fentaNYL  1 patch Transdermal Q72H  . levothyroxine  137 mcg Oral Q0600  . melatonin  10 mg Oral QHS  . multivitamin  1 tablet Oral QHS  .  oxyCODONE  10 mg Oral Q6H   And  . oxyCODONE-acetaminophen  1 tablet Oral Q6H  . predniSONE  40 mg Oral Q breakfast  . sodium chloride flush  3 mL Intravenous Q12H  . sodium polystyrene  15 g Oral Once  . sodium polystyrene  15 g Oral Once per day on Sun Sat  . traZODone  100 mg Oral QHS  . zolpidem  10 mg Oral QHS   Elmarie Shiley, MD 02/17/2020, 9:42 AM

## 2020-02-17 NOTE — Progress Notes (Signed)
   Progress Note   Patient is s/p L thigh AVG explantation secondary to infection. Intra-op cultures negative. He is afebrile with normal WBC.  Wound VAC placed this morning. s/p manipulation of right femoral TDC 2/1 and catheter exchange 2/4. Functioning well. He is okay for discharge from a vascular standpoint now that Wound VAC has been placed.  Plavix and aspirin continue. He will need wound  Vac changes 3x/week. He will have follow up in 2-3 weeks for wound check    Karoline Caldwell, PA-C [facility] [phone] 02/17/2020 8:03 AM

## 2020-02-17 NOTE — Progress Notes (Signed)
HD#20 Subjective:  Overnight Events: No acute events overnight.  Patient had wound VAC placed yesterday. He continues to have pain but this has been stable.  He continues to deny any chest pain, shortness of breath, abdominal pain or palpitations.  Patient states he is happy to have the wound VAC placed but wants to make sure that continues to work before he leaves.  He is agreeable to weaning him off the IV Dilaudid today and plan for discharge tomorrow after dialysis.  Objective:  Vital signs in last 24 hours: Vitals:   02/16/20 1440 02/16/20 1500 02/16/20 1944 02/17/20 0507  BP: (!) 102/45 (!) 101/44 (!) 124/58 129/62  Pulse:   78 80  Resp: (!) 9   20  Temp:  97.9 F (36.6 C) 98.4 F (36.9 C) 98.3 F (36.8 C)  TempSrc:  Oral Oral Oral  SpO2: 100%  100% 100%  Weight: 118.4 kg      Supplemental O2: Room Air SpO2: 100 % O2 Flow Rate (L/min): 3 L/min  Physical Exam:   General:  Well-appearing man laying in bed. No acute distress. CV: Regular rate. IV/VI systolic murmur. Trace LE edema.  Pulmonary: Lungs CTAB. Normal effort. Extremities: Wound VAC in place with no drainage. Skin: Warm and dry. Neuro: Alert and oriented. No focal deficits.  Filed Weights   02/13/20 1048 02/16/20 1100 02/16/20 1440  Weight: 120 kg 123.2 kg 118.4 kg     Intake/Output Summary (Last 24 hours) at 02/17/2020 0750 Last data filed at 02/16/2020 1440 Gross per 24 hour  Intake --  Output 4800 ml  Net -4800 ml   Net IO Since Admission: -22,685.95 mL [02/17/20 0750]  Recent Labs    02/15/20 0635 02/16/20 0804 02/17/20 0744  GLUCAP 103* 82 70     Pertinent Labs: CBC Latest Ref Rng & Units 02/16/2020 02/14/2020 02/13/2020  WBC 4.0 - 10.5 K/uL 12.2(H) 8.3 7.8  Hemoglobin 13.0 - 17.0 g/dL 9.1(L) 8.4(L) 8.4(L)  Hematocrit 39.0 - 52.0 % 31.4(L) 29.0(L) 29.3(L)  Platelets 150 - 400 K/uL 156 160 176    CMP Latest Ref Rng & Units 02/17/2020 02/16/2020 02/15/2020  Glucose 70 - 99 mg/dL 95  123(H) 104(H)  BUN 6 - 20 mg/dL 96(H) 158(H) 125(H)  Creatinine 0.61 - 1.24 mg/dL 10.86(H) 15.44(H) 13.59(H)  Sodium 135 - 145 mmol/L 137 138 139  Potassium 3.5 - 5.1 mmol/L 4.7 5.8(H) 5.4(H)  Chloride 98 - 111 mmol/L 98 99 101  CO2 22 - 32 mmol/L '24 22 23  '$ Calcium 8.9 - 10.3 mg/dL 8.5(L) 9.1 9.0  Total Protein 6.5 - 8.1 g/dL - - -  Total Bilirubin 0.3 - 1.2 mg/dL - - -  Alkaline Phos 38 - 126 U/L - - -  AST 15 - 41 U/L - - -  ALT 0 - 44 U/L - - -   Imaging: No results found.  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 male with hx of ESRD on MWF HD, HTN, CAD, severe aortic stenosis, spina bifida, chronic opioid use, L knee septic arthritis who presented with epistaxis, abscess near L thigh fistula, and malodorous discharge from chronic wound due to prior  L nephrostomy tube.  Now status post I&D of abscess, exchange of right femoral catheter and wound VAC placement.  Pending discharge home with home health tomorrow.  Left Thigh Abscess S/p removal of L thigh infected AVG and I&D of abscess on 1/28 and completion of 5-day course of Vancomycin. Patient had wound VAC placed successfully on 2/14.  --Vascular following, appreciate recs --Continue TED hose for leg swelling --Needs wound VAC changes 3 x/week --Follow-up with vascular in 2 to 3 weeks for wound check --Plan discharge tomorrow with home health  Acute on Chronic Pain 2/2 refractory gout and recent thigh I&D R shoulder effusion & hemarthrosis  Ortho consulted this admission due to right shoulder hemarthrosis. Pain relatively controlled overnight. Getting palliative care assistance with pain control and [place]  conversation. Plan to wean off IV Dilaudid today.  --Palliative care consulted, pending eval --Continue fentanyl patch 100 mcg --Continue OxyIR 10 mg q6h and percocet 1 tab q6h --Wean off IV Dilaudid q3h in this order (2 mg, 2 mg, 1.5 mg, 1.5 mg, 1 mg, 1 mg, 0.5 mg, 0.5 mg), then discontinue. --Continue prednisone 40 mg (day 13 of 14), complete course tomorrow --Continue IV Phenergan 6.25 mg q4h  ESRD on MWF dialysis End Stage Access (R fem TDC) R femoral HD catheter exchanged on 2/4. Patient became hypotensive after taking off too much fluid during dialysis. BP now stable. K+ down to 4.7.  Continue dialysis session.  Plan for another session later today or tomorrow morning before discharge. --Continue HD tomorrow --Continue ASA and Plavix --Daily RFP --Continue Kayexalate (Sat, Sun)  Spontaneous Ecchymosis 2/2 to DAPT and uremic platelet dysfunction  Epistaxis - resolved AoC Anemia 2/2 CKD and IDA Thrombocytopenia, resolved Has required multiple transfusions this admission as well as IV iron.  Hemoglobin and platelet currently stable.  No signs of active bleed. --Continue ASA and Plavix to help avoid HD catheter clotting --Daily CBCs  Left flank sinus tract: Dry dressing in place. Pt will f/u w/ outpatient urology Vitamin D deficiency Metabolic Bone Disease Secondary Hyperparathryoidism Continue vitamin D supplementation  Diet: Renal diet IVF: PO intake VTE: Place TED hose Start: 02/08/20 1449 SCDs Start: 01/28/20 0551 Code: Full PT/OT: Home Health with PT ID:  Anti-infectives (From admission, onward)   Start     Dose/Rate Route Frequency Ordered Stop   02/02/20 1200  vancomycin (VANCOCIN) IVPB 1000 mg/200 mL premix  Status:  Discontinued        1,000 mg 200 mL/hr over 60 Minutes Intravenous Every M-W-F (Hemodialysis) 02/01/20 1018 02/01/20 1024   01/30/20 1200  vancomycin (VANCOCIN) IVPB 1000 mg/200 mL premix  Status:  Discontinued        1,000 mg 200 mL/hr over 60  Minutes Intravenous Every M-W-F (Hemodialysis) 01/28/20 2202 02/01/20 1018   01/29/20 1200  vancomycin (VANCOCIN) IVPB 1000 mg/200 mL premix        1,000 mg 200 mL/hr over 60 Minutes Intravenous Every T-Th-Sa (Hemodialysis) 01/28/20 2202 01/31/20 1159   01/29/20 1127  vancomycin (VANCOCIN) 1-5 GM/200ML-% IVPB       Note to Pharmacy: [name]   : cabinet override      01/29/20 1127 01/29/20 2329   01/28/20 1200  vancomycin (VANCOCIN) IVPB 750 mg/150 ml premix  Status:  Discontinued        750 mg 150 mL/hr over 60 Minutes Intravenous Every M-W-F (Hemodialysis) 01/28/20 1119 01/28/20 2202   01/28/20 0900  vancomycin (VANCOCIN) 2,500 mg in sodium chloride 0.9 % 500 mL IVPB  2,500 mg 250 mL/hr over 120 Minutes Intravenous  Once 01/28/20 0828 01/28/20 1221   01/28/20 0400  vancomycin (VANCOCIN) 2,500 mg in sodium chloride 0.9 % 500 mL IVPB  Status:  Discontinued        2,500 mg 250 mL/hr over 120 Minutes Intravenous  Once 01/28/20 0353 01/28/20 1530   01/28/20 0400  ceFEPIme (MAXIPIME) 2 g in sodium chloride 0.9 % 100 mL IVPB        2 g 200 mL/hr over 30 Minutes Intravenous  Once 01/28/20 0353 01/28/20 0646      Anticipated discharge to Home in 1-2 days pending wound VAC placement.  Lacinda Axon, MD 02/17/2020, 7:50 AM Pager: [place] [facility]  Please contact the on call pager after 5 pm and on weekends at [phone].

## 2020-02-17 NOTE — Progress Notes (Signed)
Assumed care of patient at 1123. Report received from [place], [place]

## 2020-02-17 NOTE — Consult Note (Signed)
WOC Nurse Consult Note: Patient receiving care in [facility] 2W23. Reason for Consult: VAC placement to left groin Wound type: surgical incision Pressure Injury POA: Yes/No/NA Measurement: 2 cm x 14 cm x 3 cm Wound bed: 99% pink, clean; 1% yellow Drainage (amount, consistency, odor) unknown at this time Periwound: intact Dressing procedure/placement/frequency: one piece of black foam placed into the wound bed, drape applied, immediate seal obtained. Patient tolerated well. Next change due is Friday 02/20/20. Val Riles, RN, MSN, CWOCN, CNS-BC, pager [phone]

## 2020-02-17 NOTE — Progress Notes (Signed)
PT Cancellation Note  Patient Details Name: [name] MRN: [id number] DOB: 01/03/1965   Cancelled Treatment:    Reason Eval/Treat Not Completed: Pain limiting ability to participate  Patient reports leg in too much pain from [facility] placement. Requests PT return in pm after next round of pain medicine (~1:00 pm). Explained to pt cannot promise what pm schedule will look like and wanted to assure he can ascend steps into home prior to discharge. Pt reported he has one small step (<4") and has no difficulty with this. States he has walked a long enough distance to be able to get from car into home. He continued to request to defer PT at this time and for PT to return in pm.    Arby Barrette, PT Pager [phone]  Rexanne Mano 02/17/2020, 9:53 AM

## 2020-02-18 DIAGNOSIS — M10312 Gout due to renal impairment, left shoulder: Secondary | ICD-10-CM | POA: Diagnosis not present

## 2020-02-18 DIAGNOSIS — D691 Qualitative platelet defects: Secondary | ICD-10-CM | POA: Diagnosis not present

## 2020-02-18 DIAGNOSIS — T829XXD Unspecified complication of cardiac and vascular prosthetic device, implant and graft, subsequent encounter: Secondary | ICD-10-CM | POA: Diagnosis not present

## 2020-02-18 DIAGNOSIS — L02416 Cutaneous abscess of left lower limb: Secondary | ICD-10-CM | POA: Diagnosis not present

## 2020-02-18 LAB — RENAL FUNCTION PANEL
Albumin: 3.2 g/dL — ABNORMAL LOW (ref 3.5–5.0)
Anion gap: 17 — ABNORMAL HIGH (ref 5–15)
BUN: 79 mg/dL — ABNORMAL HIGH (ref 6–20)
CO2: 21 mmol/L — ABNORMAL LOW (ref 22–32)
Calcium: 8.4 mg/dL — ABNORMAL LOW (ref 8.9–10.3)
Chloride: 98 mmol/L (ref 98–111)
Creatinine, Ser: 8.51 mg/dL — ABNORMAL HIGH (ref 0.61–1.24)
GFR, Estimated: 7 mL/min — ABNORMAL LOW (ref >60)
Glucose, Bld: 89 mg/dL (ref 70–99)
Phosphorus: 4.6 mg/dL (ref 2.5–4.6)
Potassium: 5 mmol/L (ref 3.5–5.1)
Sodium: 136 mmol/L (ref 135–145)

## 2020-02-18 LAB — CBC
HCT: 27.8 % — ABNORMAL LOW (ref 39.0–52.0)
Hemoglobin: 8.5 g/dL — ABNORMAL LOW (ref 13.0–17.0)
MCH: 30.8 pg (ref 26.0–34.0)
MCHC: 30.6 g/dL (ref 30.0–36.0)
MCV: 100.7 fL — ABNORMAL HIGH (ref 80.0–100.0)
Platelets: 151 10*3/uL (ref 150–400)
RBC: 2.76 MIL/uL — ABNORMAL LOW (ref 4.22–5.81)
RDW: 21.6 % — ABNORMAL HIGH (ref 11.5–15.5)
WBC: 9.8 10*3/uL (ref 4.0–10.5)
nRBC: 0 % (ref 0.0–0.2)

## 2020-02-18 MED ORDER — POLYETHYLENE GLYCOL 3350 17 G PO PACK: 17.0000 g | PACK | Freq: Every day | ORAL | 0 refills | Status: DC

## 2020-02-18 MED ORDER — VITAMIN D3 25 MCG PO TABS: 1000.0000 [IU] | ORAL_TABLET | Freq: Every day | ORAL | 0 refills | Status: AC

## 2020-02-18 MED ORDER — ASPIRIN 81 MG PO TBEC: 81.0000 mg | DELAYED_RELEASE_TABLET | Freq: Every day | ORAL | 11 refills | Status: DC

## 2020-02-18 MED ORDER — PROMETHAZINE HCL 25 MG/ML IJ SOLN
12.5000 mg | Freq: Once | INTRAMUSCULAR | Status: AC
  Administered 2020-02-18: 12.5 mg via INTRAVENOUS

## 2020-02-18 MED ORDER — OXYCODONE-ACETAMINOPHEN 5-325 MG PO TABS: 1.0000 | ORAL_TABLET | Freq: Four times a day (QID) | ORAL | 0 refills | Status: DC

## 2020-02-18 MED ORDER — SENNA 8.6 MG PO TABS: 2.0000 | ORAL_TABLET | Freq: Every day | ORAL | 0 refills | Status: DC | PRN

## 2020-02-18 MED ORDER — FENTANYL 100 MCG/HR TD PT72: 1.0000 | MEDICATED_PATCH | TRANSDERMAL | 0 refills | Status: DC

## 2020-02-18 MED ORDER — PROMETHAZINE HCL 25 MG/ML IJ SOLN
INTRAMUSCULAR | Status: AC
  Filled 2020-02-18: qty 1

## 2020-02-18 MED ORDER — OXYCODONE HCL 10 MG PO TABS: 10.0000 mg | ORAL_TABLET | Freq: Four times a day (QID) | ORAL | 0 refills | Status: DC

## 2020-02-18 MED ORDER — CALCIUM ACETATE (PHOS BINDER) 667 MG PO CAPS: 2668.0000 mg | ORAL_CAPSULE | Freq: Three times a day (TID) | ORAL | 0 refills | Status: DC

## 2020-02-18 MED ORDER — DOXERCALCIFEROL 4 MCG/2ML IV SOLN: 4.0000 ug | INTRAVENOUS | 0 refills | Status: DC

## 2020-02-18 MED ORDER — DOXERCALCIFEROL 4 MCG/2ML IV SOLN
INTRAVENOUS | Status: AC
  Filled 2020-02-18: qty 2

## 2020-02-18 MED ORDER — CINACALCET HCL 30 MG PO TABS: 30.0000 mg | ORAL_TABLET | ORAL | 0 refills | Status: DC

## 2020-02-18 MED ORDER — ALTEPLASE 2 MG IJ SOLR
INTRAMUSCULAR | Status: AC
  Administered 2020-02-18: 2 mg
  Filled 2020-02-18: qty 4

## 2020-02-18 NOTE — TOC Progression Note (Addendum)
Transition of Care [facility]) - Progression Note    Patient Details  Name: [name] MRN: [id number] Date of Birth: May 13, 1965  [facility]) CM/SW Contact  Angelita Ingles, RN Phone Number: 02/18/2020, 2:46 PM  Clinical Narrative:    Bedside nurse made CM aware that patient will need wound vac for discharge. Wound vac orders have been faxed to [facility].  [place] made CM aware that patient has discharge orders and would like to know when wound vac will be delivered. CM called Leontine Locket with [employer] to inquire how long it will take to obtain wound vac. Per [name] she will try to expedite the process and if they can get approval the vac would be delivered via courier later tonight.  CM has informed the charge nurse and the bedside nurse that wound vac should be expected tomorrow. TOC will continue to follow.    Expected Discharge Plan: [place] Barriers to Discharge: Continued Medical Work up  Expected Discharge Plan and Services Expected Discharge Plan: [place]   Discharge Planning Services: CM Consult Post Acute Care Choice: [place] arrangements for the past 2 months: [place] Agency: [facility]         Social Determinants of Health (SDOH) Interventions    Readmission Risk Interventions No flowsheet data found.

## 2020-02-18 NOTE — Progress Notes (Signed)
Subjective  -  C/o being light headed   Physical Exam:  Vac in place Right groin dry       Assessment/Plan:    OK to d/c from my perspective.  He will f/u in the office in a few weeks for a wound check  Wells Brabham 02/18/2020 10:06 AM --  Vitals:   02/18/20 0910 02/18/20 0920  BP: (!) 91/50 (!) 59/29  Pulse:    Resp: 12 11  Temp:    SpO2:      Intake/Output Summary (Last 24 hours) at 02/18/2020 1006 Last data filed at 02/17/2020 1845 Gross per 24 hour  Intake -  Output 3000 ml  Net -3000 ml     Laboratory CBC    Component Value Date/Time   WBC 9.8 02/18/2020 0231   HGB 8.5 (L) 02/18/2020 0231   HCT 27.8 (L) 02/18/2020 0231   PLT 151 02/18/2020 0231    BMET    Component Value Date/Time   NA 136 02/18/2020 0231   NA 135 (A) 01/17/2018 0000   K 5.0 02/18/2020 0231   CL 98 02/18/2020 0231   CO2 21 (L) 02/18/2020 0231   GLUCOSE 89 02/18/2020 0231   BUN 79 (H) 02/18/2020 0231   BUN 67 (A) 01/17/2018 0000   CREATININE 8.51 (H) 02/18/2020 0231   CALCIUM 8.4 (L) 02/18/2020 0231   CALCIUM 8.5 (L) 01/31/2020 1157   GFRNONAA 7 (L) 02/18/2020 0231   GFRAA 10 (L) 10/07/2019 0430    COAG Lab Results  Component Value Date   INR 1.1 01/28/2020   INR 1.1 12/11/2019   INR 1.0 12/01/2018   No results found for: PTT  Antibiotics Anti-infectives (From admission, onward)   Start     Dose/Rate Route Frequency Ordered Stop   02/02/20 1200  vancomycin (VANCOCIN) IVPB 1000 mg/200 mL premix  Status:  Discontinued        1,000 mg 200 mL/hr over 60 Minutes Intravenous Every M-W-F (Hemodialysis) 02/01/20 1018 02/01/20 1024   01/30/20 1200  vancomycin (VANCOCIN) IVPB 1000 mg/200 mL premix  Status:  Discontinued        1,000 mg 200 mL/hr over 60 Minutes Intravenous Every M-W-F (Hemodialysis) 01/28/20 2202 02/01/20 1018   01/29/20 1200  vancomycin (VANCOCIN) IVPB 1000 mg/200 mL premix        1,000 mg 200 mL/hr over 60 Minutes Intravenous Every T-Th-Sa  (Hemodialysis) 01/28/20 2202 01/31/20 1159   01/29/20 1127  vancomycin (VANCOCIN) 1-5 GM/200ML-% IVPB       Note to Pharmacy: [name]   : cabinet override      01/29/20 1127 01/29/20 2329   01/28/20 1200  vancomycin (VANCOCIN) IVPB 750 mg/150 ml premix  Status:  Discontinued        750 mg 150 mL/hr over 60 Minutes Intravenous Every M-W-F (Hemodialysis) 01/28/20 1119 01/28/20 2202   01/28/20 0900  vancomycin (VANCOCIN) 2,500 mg in sodium chloride 0.9 % 500 mL IVPB        2,500 mg 250 mL/hr over 120 Minutes Intravenous  Once 01/28/20 0828 01/28/20 1221   01/28/20 0400  vancomycin (VANCOCIN) 2,500 mg in sodium chloride 0.9 % 500 mL IVPB  Status:  Discontinued        2,500 mg 250 mL/hr over 120 Minutes Intravenous  Once 01/28/20 0353 01/28/20 1530   01/28/20 0400  ceFEPIme (MAXIPIME) 2 g in sodium chloride 0.9 % 100 mL IVPB        2 g 200 mL/hr over 30  Minutes Intravenous  Once 01/28/20 0353 01/28/20 0646       V. Leia Alf, M.D., [facility] [place] Office: [phone] Pager:  [phone]

## 2020-02-18 NOTE — Progress Notes (Signed)
This chaplain responded to PMT consult for spiritual care.  The Pt. shares he is tired from HD and the RN is bringing pain medication for his wound.The Pt. is  is accepting of the chaplain's visit, but both the Pt. and chaplain agree to keep the visit short so the Pt. can rest. The chaplain learned the Pt. rests better in dark spaces.   The chaplain understands the Pt. is a life long [employer].  The sharing of the Pt. crosses early in the visit communicated the trust the Pt. has in his religious beliefs. The Pt. shares "he doesn't go anywhere without his crosses."  The Pt. is open to F/U spiritual care if the visit coordinates with his upcoming discharge.

## 2020-02-18 NOTE — Plan of Care (Signed)

## 2020-02-18 NOTE — Procedures (Signed)
Patient seen on Hemodialysis. BP (!) 82/42 Comment: asymptomatic  Pulse 60   Temp 98.6 F (37 C) (Oral)   Resp 10   Ht '5\' 11"'$  (1.803 m)   Wt 120.6 kg   SpO2 100%   BMI 37.08 kg/m   QB 300, UF goal 3L Tolerating treatment with tolerable pain over wound vac site at this time.   Darrell Shiley MD [facility]. Office # [phone] Pager # [phone] 8:57 AM

## 2020-02-19 LAB — CBC
HCT: 28.5 % — ABNORMAL LOW (ref 39.0–52.0)
Hemoglobin: 8.4 g/dL — ABNORMAL LOW (ref 13.0–17.0)
MCH: 30.3 pg (ref 26.0–34.0)
MCHC: 29.5 g/dL — ABNORMAL LOW (ref 30.0–36.0)
MCV: 102.9 fL — ABNORMAL HIGH (ref 80.0–100.0)
Platelets: 131 10*3/uL — ABNORMAL LOW (ref 150–400)
RBC: 2.77 MIL/uL — ABNORMAL LOW (ref 4.22–5.81)
RDW: 21.5 % — ABNORMAL HIGH (ref 11.5–15.5)
WBC: 8.8 10*3/uL (ref 4.0–10.5)
nRBC: 0.2 % (ref 0.0–0.2)

## 2020-02-19 LAB — RENAL FUNCTION PANEL
Albumin: 3.1 g/dL — ABNORMAL LOW (ref 3.5–5.0)
Anion gap: 13 (ref 5–15)
BUN: 80 mg/dL — ABNORMAL HIGH (ref 6–20)
CO2: 24 mmol/L (ref 22–32)
Calcium: 8.5 mg/dL — ABNORMAL LOW (ref 8.9–10.3)
Chloride: 99 mmol/L (ref 98–111)
Creatinine, Ser: 8.16 mg/dL — ABNORMAL HIGH (ref 0.61–1.24)
GFR, Estimated: 7 mL/min — ABNORMAL LOW (ref >60)
Glucose, Bld: 110 mg/dL — ABNORMAL HIGH (ref 70–99)
Phosphorus: 4.9 mg/dL — ABNORMAL HIGH (ref 2.5–4.6)
Potassium: 5.8 mmol/L — ABNORMAL HIGH (ref 3.5–5.1)
Sodium: 136 mmol/L (ref 135–145)

## 2020-02-19 MED ORDER — SODIUM POLYSTYRENE SULFONATE 15 GM/60ML PO SUSP
15.0000 g | Freq: Once | ORAL | Status: DC
  Filled 2020-02-19: qty 60

## 2020-02-19 NOTE — Plan of Care (Signed)

## 2020-02-19 NOTE — Progress Notes (Signed)
Patient ID: [name], male   DOB: [birth year], 55 y.o.   MRN: [id number] Plans noted for discharge home today and ongoing wound care/OP VVS follow up. He is aware to continue OP HD at his usual MWF schedule at [facility].  Elmarie Shiley MD [facility]. Office # [phone] Pager # [phone] 8:19 AM

## 2020-02-19 NOTE — Progress Notes (Signed)
PT Cancellation Note  Patient Details Name: [name] MRN: [id number] DOB: Jun 11, 1965   Cancelled Treatment:    Reason Eval/Treat Not Completed: Other (comment)  Patient just wants to rest prior to discharge home today.   Arby Barrette, PT Pager [phone]   Rexanne Mano 02/19/2020, 4:37 PM

## 2020-02-19 NOTE — Progress Notes (Signed)
Internal Medicine Attending Note:  This patient's plan of care was discussed with the house staff. Please see their note for complete details. I concur with their findings.  Patient discharged 2/16 but unable to leave pending home wound vac delivery. I will see formally tomorrow if still here.   Velna Ochs, MD 02/19/2020, 6:46 PM

## 2020-02-19 NOTE — Progress Notes (Signed)
IMTS PROGRESS NOTE  Pt was unable to go home last evening due to a delay in home wound vac delivery. No significant overnight events  I visited with [place] this morning. No new complaints. He is looking forward to getting home and getting back on his regular HD schedule.   Discussed wound vac delivery with bedside RN who spoke with social work. My impression is that the social worker is looking into it and is hopeful for delivery today.  Pt remains stable for discharge upon delivery of wound vac.  Mitzi Hansen, MD Internal Medicine Resident PGY-2 [name] Pager: [phone] 02/19/2020 12:23 PM

## 2020-02-19 NOTE — TOC Progression Note (Signed)
Transition of Care [facility]) - Progression Note    Patient Details  Name: [name] MRN: [id number] Date of Birth: 27-Jun-1965  Transition of Care [facility]) CM/SW [place], RN Phone Number: [phone]  02/19/2020, 2:21 PM  Clinical Narrative:    CM has received message about wound vac arrival. Just heard from [place] with [employer] about the wound vac. Seems that the patient has had a previous vac so he is a restart with the payor and this takes just a little more time. So the vac hasn't been released yet. There is not a guarantee that the vac will arrive today. [employer] will update CM asap. Message has been sent to MD and primary nurse.    Expected Discharge Plan: [place] Barriers to Discharge: Continued Medical Work up  Expected Discharge Plan and Services Expected Discharge Plan: [place]   Discharge Planning Services: CM Consult Post Acute Care Choice: Home Health,Durable Medical Equipment Living arrangements for the past 2 months: Single Family Home Expected Discharge Date: 02/18/20                           [place] Determinants of Health (SDOH) Interventions    Readmission Risk Interventions No flowsheet data found.

## 2020-02-20 DIAGNOSIS — N99528 Other complication of other external stoma of urinary tract: Secondary | ICD-10-CM

## 2020-02-20 LAB — RENAL FUNCTION PANEL
Albumin: 3.2 g/dL — ABNORMAL LOW (ref 3.5–5.0)
Anion gap: 16 — ABNORMAL HIGH (ref 5–15)
BUN: 112 mg/dL — ABNORMAL HIGH (ref 6–20)
CO2: 23 mmol/L (ref 22–32)
Calcium: 8.4 mg/dL — ABNORMAL LOW (ref 8.9–10.3)
Chloride: 98 mmol/L (ref 98–111)
Creatinine, Ser: 10.79 mg/dL — ABNORMAL HIGH (ref 0.61–1.24)
GFR, Estimated: 5 mL/min — ABNORMAL LOW (ref >60)
Glucose, Bld: 90 mg/dL (ref 70–99)
Phosphorus: 6.3 mg/dL — ABNORMAL HIGH (ref 2.5–4.6)
Potassium: 5.6 mmol/L — ABNORMAL HIGH (ref 3.5–5.1)
Sodium: 137 mmol/L (ref 135–145)

## 2020-02-20 LAB — CBC
HCT: 28.4 % — ABNORMAL LOW (ref 39.0–52.0)
Hemoglobin: 8.8 g/dL — ABNORMAL LOW (ref 13.0–17.0)
MCH: 31.3 pg (ref 26.0–34.0)
MCHC: 31 g/dL (ref 30.0–36.0)
MCV: 101.1 fL — ABNORMAL HIGH (ref 80.0–100.0)
Platelets: 124 10*3/uL — ABNORMAL LOW (ref 150–400)
RBC: 2.81 MIL/uL — ABNORMAL LOW (ref 4.22–5.81)
RDW: 21.5 % — ABNORMAL HIGH (ref 11.5–15.5)
WBC: 8.8 10*3/uL (ref 4.0–10.5)
nRBC: 0 % (ref 0.0–0.2)

## 2020-02-20 MED ORDER — ALTEPLASE 2 MG IJ SOLR
INTRAMUSCULAR | Status: AC
  Filled 2020-02-20: qty 4

## 2020-02-20 MED ORDER — HYDROMORPHONE HCL 1 MG/ML IJ SOLN
2.0000 mg | INTRAMUSCULAR | Status: AC | PRN
  Administered 2020-02-20 (×2): 2 mg via INTRAVENOUS
  Filled 2020-02-20: qty 2

## 2020-02-20 MED ORDER — HYDROMORPHONE HCL 1 MG/ML IJ SOLN
2.0000 mg | Freq: Once | INTRAMUSCULAR | Status: AC
  Administered 2020-02-20: 2 mg via INTRAVENOUS
  Filled 2020-02-20: qty 2

## 2020-02-20 MED ORDER — SODIUM POLYSTYRENE SULFONATE 15 GM/60ML PO SUSP
15.0000 g | Freq: Once | ORAL | Status: AC
  Administered 2020-02-20: 15 g via ORAL
  Filled 2020-02-20: qty 60

## 2020-02-20 MED ORDER — PROMETHAZINE HCL 25 MG/ML IJ SOLN
12.5000 mg | Freq: Once | INTRAMUSCULAR | Status: AC
  Administered 2020-02-20: 12.5 mg via INTRAVENOUS

## 2020-02-20 MED ORDER — HYDROMORPHONE HCL 1 MG/ML IJ SOLN
INTRAMUSCULAR | Status: AC
  Filled 2020-02-20: qty 2

## 2020-02-20 MED ORDER — PROMETHAZINE HCL 25 MG/ML IJ SOLN
INTRAMUSCULAR | Status: AC
  Filled 2020-02-20: qty 1

## 2020-02-20 MED ORDER — BISACODYL 10 MG RE SUPP
10.0000 mg | Freq: Once | RECTAL | Status: AC
  Administered 2020-02-20: 10 mg via RECTAL
  Filled 2020-02-20: qty 1

## 2020-02-20 MED ORDER — DOXERCALCIFEROL 4 MCG/2ML IV SOLN
INTRAVENOUS | Status: AC
  Filled 2020-02-20: qty 2

## 2020-02-20 MED ORDER — SENNOSIDES-DOCUSATE SODIUM 8.6-50 MG PO TABS
2.0000 | ORAL_TABLET | Freq: Two times a day (BID) | ORAL | Status: DC
  Administered 2020-02-20 – 2020-02-21 (×3): 2 via ORAL
  Filled 2020-02-20 (×3): qty 2

## 2020-02-20 NOTE — Progress Notes (Signed)
Subjective  -   Complaining of pain in his left groin   Physical Exam:  Left groin wound VAC is in place and functioning properly Dressing in right groin was removed.  This is healing appropriately.       Assessment/Plan:    Patient scheduled for dialysis today he will be discharged in the immediate future.  He will follow-up with me in the office for wound check in a few weeks.  [name] 02/20/2020 10:22 PM --  Vitals:   02/20/20 1700 02/20/20 2032  BP: (!) 119/54 125/80  Pulse: 76 80  Resp: 18 20  Temp: 98.2 F (36.8 C) 98 F (36.7 C)  SpO2: 100% 97%    Intake/Output Summary (Last 24 hours) at 02/20/2020 2222 Last data filed at 02/20/2020 1700 Gross per 24 hour  Intake 3 ml  Output 4100 ml  Net -4097 ml     Laboratory CBC    Component Value Date/Time   WBC 8.8 02/20/2020 0419   HGB 8.8 (L) 02/20/2020 0419   HCT 28.4 (L) 02/20/2020 0419   PLT 124 (L) 02/20/2020 0419    BMET    Component Value Date/Time   NA 137 02/20/2020 0419   NA 135 (A) 01/17/2018 0000   K 5.6 (H) 02/20/2020 0419   CL 98 02/20/2020 0419   CO2 23 02/20/2020 0419   GLUCOSE 90 02/20/2020 0419   BUN 112 (H) 02/20/2020 0419   BUN 67 (A) 01/17/2018 0000   CREATININE 10.79 (H) 02/20/2020 0419   CALCIUM 8.4 (L) 02/20/2020 0419   CALCIUM 8.5 (L) 01/31/2020 1157   GFRNONAA 5 (L) 02/20/2020 0419   GFRAA 10 (L) 10/07/2019 0430    COAG Lab Results  Component Value Date   INR 1.1 01/28/2020   INR 1.1 12/11/2019   INR 1.0 12/01/2018   No results found for: PTT  Antibiotics Anti-infectives (From admission, onward)   Start     Dose/Rate Route Frequency Ordered Stop   02/02/20 1200  vancomycin (VANCOCIN) IVPB 1000 mg/200 mL premix  Status:  Discontinued        1,000 mg 200 mL/hr over 60 Minutes Intravenous Every M-W-F (Hemodialysis) 02/01/20 1018 02/01/20 1024   01/30/20 1200  vancomycin (VANCOCIN) IVPB 1000 mg/200 mL premix  Status:  Discontinued        1,000 mg 200  mL/hr over 60 Minutes Intravenous Every M-W-F (Hemodialysis) 01/28/20 2202 02/01/20 1018   01/29/20 1200  vancomycin (VANCOCIN) IVPB 1000 mg/200 mL premix        1,000 mg 200 mL/hr over 60 Minutes Intravenous Every T-Th-Sa (Hemodialysis) 01/28/20 2202 01/31/20 1159   01/29/20 1127  vancomycin (VANCOCIN) 1-5 GM/200ML-% IVPB       Note to Pharmacy: [name]   : cabinet override      01/29/20 1127 01/29/20 2329   01/28/20 1200  vancomycin (VANCOCIN) IVPB 750 mg/150 ml premix  Status:  Discontinued        750 mg 150 mL/hr over 60 Minutes Intravenous Every M-W-F (Hemodialysis) 01/28/20 1119 01/28/20 2202   01/28/20 0900  vancomycin (VANCOCIN) 2,500 mg in sodium chloride 0.9 % 500 mL IVPB        2,500 mg 250 mL/hr over 120 Minutes Intravenous  Once 01/28/20 0828 01/28/20 1221   01/28/20 0400  vancomycin (VANCOCIN) 2,500 mg in sodium chloride 0.9 % 500 mL IVPB  Status:  Discontinued        2,500 mg 250 mL/hr over 120 Minutes Intravenous  Once  01/28/20 0353 01/28/20 1530   01/28/20 0400  ceFEPIme (MAXIPIME) 2 g in sodium chloride 0.9 % 100 mL IVPB        2 g 200 mL/hr over 30 Minutes Intravenous  Once 01/28/20 0353 01/28/20 0646       V. Leia Alf, M.D., [facility] [place] Office: [phone] Pager:  [phone]

## 2020-02-20 NOTE — Progress Notes (Signed)
Patient ID: [name], male   DOB: 20-Oct-1965, 55 y.o.   MRN: [id number] Alerted by resident from primary service this morning that [name] was not able to be discharged yesterday.  I have alerted the renal navigator and will attempt to get him an outpatient dialysis unit spot with the contingency plan of having inpatient dialysis here (most likely later in the day which may again hinder his discharge).  Elmarie Shiley MD [facility]. Office # [phone] Pager # [phone] 8:08 AM

## 2020-02-20 NOTE — Progress Notes (Signed)
Daily Progress Note   Patient Name: [name]       Date: 02/20/2020 DOB: [birth year]  Age: 55 y.o. MRN#: [id number] Attending Physician: Velna Ochs, MD Primary Care Physician: [name], MD Admit Date: 01/27/2020  Reason for Consultation/Follow-up: Establishing goals of care  Subjective: Medical records reviewed. Discussed with RN/Sherry and assessed patient. [name] is in discomfort this morning and attributes this to pain at wound vac site, as well as having the urge to defecate. He reports no relief one hour after receiving suppository. He is awaiting PRN Dilaudid and hopes to have a bowel movement before dialysis today.  GOC: [name] confides that he has been tearful several times this week. He states that he feels sorry for himself and has thought in depth about further interventions as his health decline progresses. He acknowledges a shift in his quality of life but clarifies that it is not yet enough for him to change his code status. He states without prompting that he is not depressed and "wouldn't even know what that looks like." He continues to rely heavily on his faith and sense of humor to cope. He feels that his quality of life would improve if his pain and bowel habits were addressed.  He restates his desire to discontinue interventions IF he was paralyzed from the neck down or unable to use his hands/mind. Patient has now also identified that he would not want a colostomy if ever required and he would pursue comfort measures at that time. He does not like the thought of a comfort focus as he is not a quitter. Provided support and encouragement that he has the right to decide when enough is enough.  A MOST form was introduced.  Reviewed scope of treatment in detail  including CPR, medical interventions, antibiotics, IV fluids, and artificial nutrition.  Discussed the risks and benefits of various treatment options in detail. All questions answered.  Offered advanced directive packet for review after discharge and patient accepted graciously.  He states that he has discussed HCPOA with his cousin and would also consider his best friend's mother, Arbie Cookey.  Provided with PMT contact information.  Encouraged to call with any additional needs or concerns.  We will continue to support holistically.  Length of Stay: 23  Current Medications: Scheduled Meds:  . (feeding supplement) PROSource Plus  30 mL Oral TID BM  . aspirin EC  81 mg Oral Daily  . calcium acetate  2,668 mg Oral TID WC  . Chlorhexidine Gluconate Cloth  6 each Topical Q0600  . cholecalciferol  1,000 Units Oral Daily  . cinacalcet  30 mg Oral Q M,W,F-HD  . clopidogrel  75 mg Oral Daily  . doxercalciferol  4 mcg Intravenous Q M,W,F-HD  . famotidine  40 mg Oral Daily  . fentaNYL  1 patch Transdermal Q72H  . levothyroxine  137 mcg Oral Q0600  . melatonin  10 mg Oral QHS  . multivitamin  1 tablet Oral QHS  . oxyCODONE  10 mg Oral Q6H   And  . oxyCODONE-acetaminophen  1 tablet Oral Q6H  . polyethylene glycol  17 g Oral Daily  . senna-docusate  2 tablet Oral BID  . sodium chloride flush  3 mL Intravenous Q12H  . sodium polystyrene  15 g Oral Once  . sodium polystyrene  15 g Oral Once  . sodium polystyrene  15 g Oral Once per day on Sun Sat  . traZODone  100 mg Oral QHS  . zolpidem  10 mg Oral QHS    Continuous Infusions: . sodium chloride      PRN Meds: sodium chloride, antiseptic oral rinse, fluticasone, HYDROmorphone (DILAUDID) injection, promethazine, sodium chloride flush  Physical Exam Vitals and nursing note reviewed.  Constitutional:      Appearance: He is ill-appearing.  HENT:     Head: Normocephalic and atraumatic.  Cardiovascular:     Rate and Rhythm: Normal rate.   Pulmonary:     Effort: Pulmonary effort is normal.  Abdominal:     Comments: Urostomy clean, dry, intact.  Neurological:     Mental Status: He is alert and oriented to person, place, and time.  Psychiatric:        Mood and Affect: Mood normal.        Thought Content: Thought content normal.             Vital Signs: BP 125/64 (BP Location: Left Arm)   Pulse 72   Temp 98.1 F (36.7 C) (Oral)   Resp 18   Ht '5\' 11"'$  (1.803 m)   Wt 123.4 kg   SpO2 100%   BMI 37.94 kg/m  SpO2: SpO2: 100 % O2 Device: O2 Device: Room Air O2 Flow Rate: O2 Flow Rate (L/min): 3 L/min  Intake/output summary:   Intake/Output Summary (Last 24 hours) at 02/20/2020 1404 Last data filed at 02/20/2020 [id number] Gross per 24 hour  Intake 303 ml  Output 0 ml  Net 303 ml   LBM: Last BM Date: 02/14/20 Baseline Weight: Weight: 122.4 kg Most recent weight: Weight: 123.4 kg       Palliative Assessment/Data: PPS 50%      Patient Active Problem List   Diagnosis Date Noted  . 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 Platelet dysfunction (HCC)   . Abscess of left thigh 01/28/2020  . Acute gout of right knee 12/10/2019  . Chest pressure   . Coronary artery calcification   . Chest pain   . Sepsis without acute organ dysfunction ([place])   . Vascular graft infection ([place])   . Wound infection   . Pyogenic arthritis of left knee joint ([place])   . Pain and swelling of knee, left   . Complication of vascular access for dialysis   . Hyperkalemia   . Dysphagia   . Ureteral stone  with hydronephrosis 03/26/2019  . Malfunction of nephrostomy tube ([place])   . Nephrostomy complication ([place]) [id number]  . Abnormal CT scan, gastrointestinal tract   . Flank pain 11/26/2018  . Nephrolithiasis 11/26/2018  . Hydronephrosis with renal and ureteral calculus obstruction 11/26/2018  . Left flank pain 11/26/2018  . ESRD on dialysis ([place]) 06/12/2018  . PAD (peripheral artery disease) ([place]) 12/22/2017  . Problem with  dialysis access ([place]) 12/21/2017  . Anemia 12/21/2017  . Hypothyroidism 12/21/2017  . GERD (gastroesophageal reflux disease) 12/21/2017  . Anxiety 12/21/2017  . Left great toe amputee ([place]) 12/19/2017  . Endocarditis of mitral valve   . Osteomyelitis of toe of left foot ([place])   . MSSA bacteremia 12/08/2017  . Fever   . Right flank pain   . ESRD (end stage renal disease) ([place]) 09/20/2017  . ESRD (end stage renal disease) on dialysis ([place]) 08/07/2017  . Nonhealing surgical wound 02/29/2016  . Clotted dialysis access ([place]) 01/14/2016  . Removal of staples 07/19/2012  . Complication of AV dialysis fistula 06/27/2012  . End stage renal disease ([place]) 06/27/2012    Palliative Care Assessment & Plan   Patient Profile: 55 y.o. male  with past medical history of ESRD on dialysis, left thigh abscess s/p I&D, spina bifida/myelomingocele, severe AS, PAD, CAD, HTN, nephrostomy with complication, recurrent right knee hemarthrosis, septic arthritis of left knee, gout, anxiety, hypothyroidism, anemia, and left flank wound admitted on 01/27/2020 with left thigh abscess and complication with HD catheter.    Assessment: ESRD on dialysis Left thigh absess Nephrostomy complication Anxiety Constipation Spina bifida   Recommendations/Plan: -MOST form completed. Original placed in patient chart. Copy left in discharge packet. -Advance directive documentation left in discharge packet at patient request. -Continue goals of care conversations during hospitalization. -Refer to outpatient palliative care services.  Goals of Care and Additional Recommendations:  Limitations on Scope of Treatment: Full Scope Treatment  Code Status: FULL code   Code Status Orders  (From admission, onward)         Start     Ordered   01/28/20 0552  Full code  Continuous        01/28/20 0553        Code Status History    Date Active Date Inactive Code Status Order ID Comments User Context   01/06/2020 0307  01/17/2020 0257 Full Code [id number]  Jean Rosenthal, MD ED   12/09/2019 1408 12/19/2019 2138 Full Code [id number]  Modena Nunnery D, DO ED   10/03/2019 1531 10/18/2019 2003 Full Code [id number]  Zola Button, MD ED   03/26/2019 1554 03/27/2019 1905 Full Code [id number]  Alexis Frock, MD Inpatient   03/20/2019 1834 03/24/2019 2000 Full Code [id number]  Richarda Osmond, DO ED   11/26/2018 0452 12/11/2018 1955 Full Code [id number]  Jani Gravel, MD ED   06/12/2018 2104 06/15/2018 1719 Full Code [id number]  Dagoberto Ligas, PA-C Inpatient   12/21/2017 1518 12/23/2017 1707 Full Code [id number]  Reubin Milan, MD Inpatient   12/07/2017 2157 12/17/2017 2200 Full Code [id number]  Merton Border, MD ED   09/20/2017 1329 09/21/2017 2118 Full Code [id number]  Dagoberto Ligas, PA-C Inpatient   08/07/2017 2210 08/10/2017 1934 Full Code [id number]  Dagoberto Ligas, PA-C Inpatient   02/29/2016 1252 03/03/2016 1916 Full Code [id number]  Alvia Grove, PA-C Inpatient   01/14/2016 2310 01/22/2016 0439 Full Code [id number]  Alvia Grove, PA-C Inpatient   10/16/2012 1200 10/17/2012 2120 Full Code [id number]  Richrd Prime, PA-C  Inpatient   06/27/2012 2305 06/29/2012 0011 Full Code [id number]  Richrd Prime, PA-C Inpatient   Advance Care Planning Activity      Prognosis:   Poor prognosis long term  Discharge Planning:  Home with Palliative Services  Care plan was discussed with patient, RN/Sherry, LCSW/Colleen, Case Manager/Ella.  Thank you for allowing the Palliative Medicine Team to assist in the care of this patient.   Time In: 11:00am Time Out: 12:00pm Total Time 60 minutes Prolonged Time Billed  no     Greater than 50% of this time was spent counseling and coordinating care related to the above assessment and plan.  Resa Rinks Johnnette Litter, PA-C  Please contact Palliative Medicine Team phone at [phone] for questions and concerns.

## 2020-02-20 NOTE — Consult Note (Signed)
[place] Nurse wound follow up Patient receiving care in [facility] 2W23. Wound type: left groin surgical incision Measurement: measured earlier this week Wound bed: 100% beefy red Drainage (amount, consistency, odor) serosanguinous in cannister Periwound: intact Dressing procedure/placement/frequency: one piece of black foam removed, one piece placed into wound, drape applied, immediate seal obtained. I plugged in his home VAC machine to charge, and inserted a cannister to the home VAC. I showed his primary RN how to turn the machine on and connect the [facility] pad tubing.  WOC Nurse Consult Note: Reason for Consult: left back old nephrostomy tube insertion site Wound type: Pressure Injury POA: Yes/No/NA Measurement: Wound bed: Drainage (amount, consistency, odor) heavy greenish on existing gauze Periwound: intact but wet from drainage Dressing procedure/placement/frequency:   Wash left back old nephrostomy tube site with soap and water, dry. Place 2 pieces of Aquacel Kellie Simmering [phone]) over the wound, then ABD pads, tape in place. Val Riles, RN, MSN, CWOCN, CNS-BC, pager [phone]

## 2020-02-20 NOTE — TOC Progression Note (Addendum)
Transition of Care [facility]) - Progression Note    Patient Details  Name: Mithil Hirabayashi MRN: [id number] Date of Birth: 27-Feb-1965  Transition of Care [facility]) CM/SW Contact  Angelita Ingles, RN Phone Number: [phone]  02/20/2020, 10:47 AM  Clinical Narrative:    Patient to be discharged today after inpatient Hemodialysis treatment. D/c packet has been placed on chart. CM will call for transport if ready before end of shift. Otherwise number for [facility] has been placed on front of d/c packet and address has been verified.   [id number] Transport has been set up via [facility] [employer] [facility] for 5pm pickup after hemodialysis treatment. D/c packet is in chart. Bedside nurse has been made aware.     Expected Discharge Plan: [place] Barriers to Discharge: Continued Medical Work up  Expected Discharge Plan and Services Expected Discharge Plan: [place]   Discharge Planning Services: CM Consult Post Acute Care Choice: Home Health,Durable Medical Equipment Living arrangements for the past 2 months: Single Family Home Expected Discharge Date: 02/18/20                           [employer] Determinants of Health (SDOH) Interventions    Readmission Risk Interventions No flowsheet data found.

## 2020-02-20 NOTE — Progress Notes (Signed)
Internal Medicine Attending Note:  Patient seen this morning prior to dialysis. Wound VAC has been delivered. Continues to complain of severe pain.   Blood pressure (!) 119/54, pulse 76, temperature 98.2 F (36.8 C), temperature source Oral, resp. rate 18, height '5\' 11"'$  (1.803 m), weight 119 kg, SpO2 100 %. Physical Exam Constitutional: NAD, appears comfortable Cardiovascular: RRR, loud friction murmur  Pulmonary/Chest: CTAB, normal effort Abdominal: Soft, non tender, non distended. +BS.  Extremities: Warm and well perfused. Left groin wound vac in place, right fem HD cath  Patient here with ESRD, end stage vascular access issues, and refractory gout with hemarthrosis into his right shoulder. Admitted for left thigh abscess 2/2 to infection non functioning AVG s/p debridement with graft removal per VVS, now with wound vac in place. Also flow rate issues with his right fem [place] Underwent catheter exchange per VVS this admission and now seems to be functioning properly.   ESRD on HD End Stage Vascular Access (Right femoral HD cath) Planning for discharge after dialysis today, continue outpatient M/W/F. Appreciate nephrology and VVS assistance this admission.  Left Thigh Abscess 2/2 Infected non functioning AVG S/p debridement with graft removal per VVS, wound vac in place. Completed treatment with 5 days of IV vancomycin (dosed with HD)  Refractory Gout Right shoulder hemarthrosis  Seen by ortho, treated with 14 days of prednisone.   Acute on Chronic Pain: Pain control continues to be a significant issue. Obviously has poor coping skills to deal with his pain. However given his overall poor prognosis, pain management was addressed more as a palliative approach. His home oxy was increased to 15 mg and he was started on a fentanyl patch. Follow up with PCP.   Goals of Care: Palliative care consulted this admission. Remains full code and full scope of  care. Will need ongoing goals of care conversation. If his current HD cath fails, sounds like he will not have any other options for dialysis access (possible lumbar access per nephro?)   Velna Ochs, MD 02/20/2020, 5:13 PM

## 2020-02-20 NOTE — Procedures (Signed)
Patient seen on Hemodialysis. BP (!) 125/53   Pulse 68   Temp 98.1 F (36.7 C) (Oral)   Resp 18   Ht '5\' 11"'$  (1.803 m)   Wt 123.4 kg   SpO2 100%   BMI 37.94 kg/m   QB 300, UF goal 4.1L Tolerating treatment without complaints at this time.   Elmarie Shiley MD [facility]. Office # [phone] Pager # [phone] 2:16 PM

## 2020-02-20 NOTE — Progress Notes (Signed)
Nutrition Follow Up  DOCUMENTATION CODES:   Obesity unspecified  INTERVENTION:   -Double protein portions with meals -30 ml Prosource Plus TID, each supplement provides 100 kcals and 15 grams protein -Renal MVI daily -Snacks TID with meals  NUTRITION DIAGNOSIS:   Increased nutrient needs related to wound healing as evidenced by estimated needs.  Ongoing  GOAL:   Patient will meet greater than or equal to 90% of their needs   Progressing  MONITOR:   PO intake,Supplement acceptance,Labs,Weight trends,Skin,I & O's  REASON FOR ASSESSMENT:   Consult Assessment of nutrition requirement/status  ASSESSMENT:   55 year old man with complex medical history including ESRD on HD, myelo meningocele, severe AS, and PAD presenting with an abscess over his prior left femoral graft site, as well as right-sided chest pain and bruising, worsening drainage from his prior left nephrostomy site, and epistaxis.Pt admitted with lt thigh abscess.   2/4- s/p Redo exposure of right femoral vein; Central venogram; Exchange of 55 cm tunneled femoral catheter using fluoroscopy  Intake remains stable. Last eight meal completions charted as 50-100%. Noted elevated potassium, provided kayexalate. Supplement intake off/on. Continue interventions. Plan d/c home once wound VAC supplies delivered.   EDW 118 kg Current weight: 123.4 kg   Medications: phoslo, vit D, sensipar, hectorol, miralax, senokot, kayexalate Labs: K 5.6 (H0 Phosphorus 6.3 (H)   Diet Order:   Diet Order            Diet - low sodium heart healthy           Diet renal with fluid restriction Fluid restriction: 1200 mL Fluid; Room service appropriate? Yes; Fluid consistency: Thin  Diet effective now                 EDUCATION NEEDS:   No education needs have been identified at this time  Skin:  Skin Assessment: Skin Integrity Issues: Skin Integrity Issues:: Incisions Incisions: lt leg, rt thigh  Last BM:  2/18  Height:    Ht Readings from Last 1 Encounters:  02/17/20 '5\' 11"'$  (1.803 m)    Weight:   Wt Readings from Last 1 Encounters:  02/20/20 123.4 kg    Ideal Body Weight:  78.2 kg  BMI:  Body mass index is 37.94 kg/m.  Estimated Nutritional Needs:   Kcal:  [id number]  Protein:  155-170 grams  Fluid:  1000 ml + UOP  Mariana Single RD, LDN Clinical Nutrition Pager listed in [place]

## 2020-02-21 DIAGNOSIS — Z515 Encounter for palliative care: Secondary | ICD-10-CM

## 2020-02-21 DIAGNOSIS — Z7189 Other specified counseling: Secondary | ICD-10-CM

## 2020-02-21 LAB — RENAL FUNCTION PANEL
Albumin: 3.5 g/dL (ref 3.5–5.0)
Anion gap: 14 (ref 5–15)
BUN: 78 mg/dL — ABNORMAL HIGH (ref 6–20)
CO2: 25 mmol/L (ref 22–32)
Calcium: 8.6 mg/dL — ABNORMAL LOW (ref 8.9–10.3)
Chloride: 99 mmol/L (ref 98–111)
Creatinine, Ser: 8.29 mg/dL — ABNORMAL HIGH (ref 0.61–1.24)
GFR, Estimated: 7 mL/min — ABNORMAL LOW (ref >60)
Glucose, Bld: 91 mg/dL (ref 70–99)
Phosphorus: 5.4 mg/dL — ABNORMAL HIGH (ref 2.5–4.6)
Potassium: 5.6 mmol/L — ABNORMAL HIGH (ref 3.5–5.1)
Sodium: 138 mmol/L (ref 135–145)

## 2020-02-21 LAB — GLUCOSE, CAPILLARY: Glucose-Capillary: 89 mg/dL (ref 70–99)

## 2020-02-21 MED ORDER — HYDROMORPHONE HCL 1 MG/ML IJ SOLN
1.0000 mg | Freq: Once | INTRAMUSCULAR | Status: AC | PRN
Start: 2020-02-21 — End: 2020-02-21
  Administered 2020-02-21: 1 mg via INTRAVENOUS
  Filled 2020-02-21: qty 1

## 2020-02-21 NOTE — TOC Transition Note (Signed)
Transition of Care [facility]) - CM/SW Discharge Note   Patient Details  Name: Khalon Beatley MRN: [id number] Date of Birth: Apr 15, 1965  [facility]) CM/SW Contact:  Carles Collet, RN Phone Number: 02/21/2020, 8:57 AM   Clinical Narrative:    Damaris Schooner w patient at bedside. He states that his sister will be picking him up from the hospital today after lunch. He has wound VAC for DC, no other DME needed. HH was already set up with [facility], liaison notified patient will DC today.     Final next level of care: [place] Barriers to Discharge: Barriers Resolved   Patient Goals and CMS Choice Patient states their goals for this hospitalization and ongoing recovery are:: to go home CMS Medicare.gov Compare Post Acute Care list provided to:: Patient Choice offered to / list presented to : Patient  Discharge Placement                       Discharge Plan and Services   Discharge Planning Services: CM Consult Post Acute Care Choice: Home Health,Durable Medical Equipment          DME Arranged: Vac         HH Arranged: PT,RN [place]: [facility] Date [place]: 02/21/20 Time [place]: [phone] Representative spoke with at [place]: [place]- notified of DC  Social Determinants of Health ([place]) Interventions     Readmission Risk Interventions No flowsheet data found.

## 2020-02-21 NOTE — Progress Notes (Signed)
Subjective:   Hospital day: 24  Overnight event: Patient was discharged 3 days ago. Receive his wound VAC yesterday and completed dialysis.  [facility] scheduled to pick up patient last night however they were not able to transport him due inability to transport patient belongings (big box with supplies and personal belongings)  This AM, patient states he was frustrated [facility] couldn't take his box of belonging. Patient states his sister will be coming to pick him up in the afternoon.  States he is still in some pain and asked for one time IV dilaudid before he leaves.   Objective:  Vital signs in last 24 hours: Vitals:   02/20/20 1700 02/20/20 2032 02/21/20 0325 02/21/20 0450  BP: (!) 119/54 125/80 (!) 124/110 135/75  Pulse: 76 80 92 90  Resp: '18 20 18 20  '$ Temp: 98.2 F (36.8 C) 98 F (36.7 C) 97.9 F (36.6 C) (!) 97.5 F (36.4 C)  TempSrc: Oral Oral Oral Oral  SpO2: 100% 97% 99% 98%  Weight: 119 kg     Height:        Filed Weights   02/18/20 0945 02/20/20 1252 02/20/20 1700  Weight: 119 kg 123.4 kg 119 kg     Intake/Output Summary (Last 24 hours) at 02/21/2020 0644 Last data filed at 02/20/2020 1700 Gross per 24 hour  Intake 3 ml  Output 4100 ml  Net -4097 ml   Net IO Since Admission: -29,086.95 mL [02/21/20 0644]  No results for input(s): GLUCAP in the last 72 hours.   Pertinent Labs: CBC Latest Ref Rng & Units 02/20/2020 02/19/2020 02/18/2020  WBC 4.0 - 10.5 K/uL 8.8 8.8 9.8  Hemoglobin 13.0 - 17.0 g/dL 8.8(L) 8.4(L) 8.5(L)  Hematocrit 39.0 - 52.0 % 28.4(L) 28.5(L) 27.8(L)  Platelets 150 - 400 K/uL 124(L) 131(L) 151    CMP Latest Ref Rng & Units 02/21/2020 02/20/2020 02/19/2020  Glucose 70 - 99 mg/dL 91 90 110(H)  BUN 6 - 20 mg/dL 78(H) 112(H) 80(H)  Creatinine 0.61 - 1.24 mg/dL 8.29(H) 10.79(H) 8.16(H)  Sodium 135 - 145 mmol/L 138 137 136  Potassium 3.5 - 5.1 mmol/L 5.6(H) 5.6(H) 5.8(H)  Chloride 98 - 111 mmol/L 99 98 99  CO2 22 - 32 mmol/L '25 23 24  '$ Calcium 8.9 -  10.3 mg/dL 8.6(L) 8.4(L) 8.5(L)  Total Protein 6.5 - 8.1 g/dL - - -  Total Bilirubin 0.3 - 1.2 mg/dL - - -  Alkaline Phos 38 - 126 U/L - - -  AST 15 - 41 U/L - - -  ALT 0 - 44 U/L - - -    Imaging: No results found.  Physical Exam  General: Pleasant, Obese male laying in bed. NAD CV: Stable IV/VI systolic murmur. No LE edema Pulmonary: On room air. Normal effort.  Abdominal: Soft. NT/ND Extremities: 2+ distal pulses.  Skin: Warm and dry. No obvious rash or lesions. Neuro: A&Ox3. No focal deficts  Assessment/Plan: [name] is a 55 y.o. male with hx of ESRD on MWF HD, HTN, CAD, severe aortic stenosis, spina bifida, chronic opioid use, L knee septic arthritis who presented with epistaxis, abscess near L thigh fistula, and malodorous discharge from chronic wound due to prior L nephrostomy tube.  Now status post I&D of abscess, exchange of right femoral catheter and wound VAC placement. Discharged a few days ago and now pending transport home by family this afternoon.  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 He will continue his home HD on MWF.  Left Thigh Abscess 2/2 Infected non functioning AVG S/p treatment with 5-day course of Vancomycin and wound vac placement on 2/14. Portable wound vac delivered yesterday with supplies. Will have wound care at home and follow up with vascular in 2-3 weeks for wound check. Pending transport home in the afternoon by sister.  Refractory Gout Right shoulder hemarthrosis  Completed 14-day course of prednisone  Acute on Chronic Pain: Pain control has been a challenge during this  and past hospitalizations. Weaned off scheduled IV dilaudid 2 days ago. Giving 1 time IV dilaudid 1 mg today before discharge. Reports he does not want a prescription for opiates as this will break his opiate contract with his PCP. Will follow up with PCP.  Goals of Care: Palliative consulted to assist with goals of care conversations. They will follow up with him in the outpatient to continue these conversations. Still FULL CODE.  Prior to Admission Living Arrangement: Home Anticipated Discharge Location: Home with home health Barriers to Discharge: None Dispo: Patient discharged on 2/16 and re-discharged on 2/18, pending ride home today.  Signed: Lacinda Axon, MD 02/21/2020, 6:44 AM  Pager: [phone] Internal Medicine Teaching Service After 5pm on weekdays and 1pm on weekends: On Call pager: [phone]

## 2020-02-21 NOTE — Progress Notes (Signed)
Pt is unable to leave due to transportation not able to carry his home equipment. they stated that the box is too big and it not able to be strapped in the back the truck and its a safety hazard. Per OSHA  Provider, [place] notified

## 2020-02-23 ENCOUNTER — Telehealth: Payer: Self-pay | Admitting: Nephrology

## 2020-02-23 NOTE — Telephone Encounter (Signed)
Transition of care contact from inpatient facility  Date of Discharge: 02/21/20 Date of Contact: 02/23/20 Method of contact: Phone- attempt   Attempted to contact patient to discuss transition of care from inpatient admission. Patient did not answer the phone. Message was left on the patient's voicemail with call back number [phone].

## 2020-03-01 LAB — FUNGUS CULTURE WITH STAIN

## 2020-03-01 LAB — FUNGAL ORGANISM REFLEX

## 2020-03-01 LAB — FUNGUS CULTURE RESULT

## 2020-03-08 ENCOUNTER — Telehealth: Payer: Self-pay

## 2020-03-08 NOTE — Telephone Encounter (Signed)
Erica from [facility] called, unable to place wound vac back due to wound closing in the middle. Will apply moistened alginate and cover with a dry dressing.

## 2020-03-15 ENCOUNTER — Encounter: Payer: Medicare Other | Admitting: Surgery

## 2020-03-18 LAB — ACID FAST CULTURE WITH REFLEXED SENSITIVITIES (MYCOBACTERIA): Acid Fast Culture: NEGATIVE

## 2020-03-29 ENCOUNTER — Other Ambulatory Visit: Payer: Self-pay

## 2020-03-29 ENCOUNTER — Ambulatory Visit (INDEPENDENT_AMBULATORY_CARE_PROVIDER_SITE_OTHER): Payer: Medicare Other | Admitting: Physician Assistant

## 2020-03-29 VITALS — BP 108/58 | HR 68 | Temp 98.3°F | Resp 20 | Ht 71.0 in | Wt 263.4 lb

## 2020-03-29 DIAGNOSIS — Z992 Dependence on renal dialysis: Secondary | ICD-10-CM

## 2020-03-29 DIAGNOSIS — N186 End stage renal disease: Secondary | ICD-10-CM

## 2020-03-29 DIAGNOSIS — T148XXA Other injury of unspecified body region, initial encounter: Secondary | ICD-10-CM

## 2020-03-30 ENCOUNTER — Encounter: Payer: Self-pay | Admitting: Physician Assistant

## 2020-03-30 NOTE — Progress Notes (Signed)
Office Note     CC:  follow up Requesting Provider:  [name], MD  HPI: [name] is a 55 y.o. ([birth year]) male who presents for left groin incision check status post removal of infected nonfunctional left thigh AV graft by Dr. Trula Slade on 01/30/2020.  During same hospital admission right femoral TDC was exchanged involving cutdown on the collateral vein in which is placed.  He is receiving home health wound care for sacral wound as well as dressing changes for the left thigh incisions.  He states these are healing well.  He states there is only minimal drainage however no sign of infection.  He denies any tissue changes to the toes of bilateral lower extremities.  He has a complex dialysis access and believed to be without options for new access going further.  He will need a translumbar catheter if right femoral TDC becomes nonfunctional.  Past Medical History:  Diagnosis Date  . Anemia   . Anxiety   . Clotted dialysis access ([place]) 01/06/2020  . Constipation   . End stage renal failure on dialysis [facility])    [place]; MWF, on HD since 1997, exhausted upper ext access, and possibly LE access; cath dependent in R groin as of 06/2018  . GERD (gastroesophageal reflux disease)   . Grand mal seizure ([place]) X 4   last 1998 (02/29/2016)  . History of blood transfusion 2000s   "I was having blood loss; never found out from where"  . History of kidney stones   . Hypertension    hx of - has not taken bp meds in over 2 years  since dialysis  . Hypothyroidism   . Insomnia   . Migraine    "due to my BP in my late 1990s; none since" (02/29/2016)  . Nonhealing surgical wound    of the left arteriovenous graft  . Severe aortic stenosis    no problems with per pt. - nephrologist and Dr. Coletta Memos  . Spina bifida [facility])     Past Surgical History:  Procedure Laterality Date  . A/V FISTULAGRAM Right 02/03/2020   Procedure: A/V Fistulagram;  Surgeon: Serafina Mitchell, MD;  Location: [place]  CV LAB;  Service: Cardiovascular;  Laterality: Right;  . AMPUTATION Left 12/11/2017   Procedure: Left Great Toe Amputation;  Surgeon: [name], MD;  Location: [place];  Service: Orthopedics;  Laterality: Left;  . APPENDECTOMY    . APPLICATION OF WOUND VAC Left 10/04/2019   Procedure: APPLICATION OF WOUND VAC;  Surgeon: Serafina Mitchell, MD;  Location: [place];  Service: Vascular;  Laterality: Left;  . ARTERIOVENOUS GRAFT PLACEMENT Left 10/16/2012   left femoral goretex graft         Dr Donnetta Hutching  . AV FISTULA PLACEMENT Left 06/27/2012   Procedure: EXPLORATORY LEFT THY-GRAFT PSEUDO-ANEURYSM;  Surgeon: Conrad Rifton, MD;  Location: [place];  Service: Vascular;  Laterality: Left;  Revision of left Arteriovenus gortex graft in thigh.  Bertram Savin REMOVAL Left 10/04/2019   Procedure: REMOVAL OF ARTERIOVENOUS GORETEX GRAFT ([place]);  Surgeon: Serafina Mitchell, MD;  Location: [facility] OR;  Service: Vascular;  Laterality: Left;  . COLONOSCOPY    . DRESSING CHANGE UNDER ANESTHESIA Left 10/07/2019   Procedure: DRESSING CHANGE UNDER ANESTHESIA;  Surgeon: Meredith Pel, MD;  Location: [place];  Service: Orthopedics;  Laterality: Left;  . EXCHANGE OF A DIALYSIS CATHETER Right 02/06/2020   Procedure: EXCHANGE OF TUNNELED DIALYSIS CATHETER;  Surgeon: Serafina Mitchell, MD;  Location: [facility] OR;  Service: Vascular;  Laterality: Right;  . FLEXIBLE SIGMOIDOSCOPY N/A 12/03/2018   Procedure: FLEXIBLE SIGMOIDOSCOPY;  Surgeon: Yetta Flock, MD;  Location: [place];  Service: Gastroenterology;  Laterality: N/A;  . I & D EXTREMITY Left 02/29/2016   Procedure: DEBRIDEMENT LEFT THIGH WOUND;  Surgeon: Waynetta Sandy, MD;  Location: [place];  Service: Vascular;  Laterality: Left;  . I & D EXTREMITY Left 10/04/2019   Procedure: IRRIGATION AND DEBRIDEMENT LEFT THIGH;  Surgeon: Serafina Mitchell, MD;  Location: [place];  Service: Vascular;  Laterality: Left;  . I & D EXTREMITY Right 12/09/2019   Procedure: IRRIGATION AND DEBRIDEMENT  EXTREMITY;  Surgeon: Altamese Kimberly, MD;  Location: [place];  Service: Orthopedics;  Laterality: Right;  . ILEOSTOMY  1970s  . INCISION AND DRAINAGE Left 10/16/2012   Procedure: INCISION AND Debridement left thigh graft;  Surgeon: Rosetta Posner, MD;  Location: [place];  Service: Vascular;  Laterality: Left;  . INSERTION OF DIALYSIS CATHETER    . INSERTION OF DIALYSIS CATHETER  01/14/2016   Procedure: INSERTION OF DIALYSIS CATHETER;  Surgeon: Waynetta Sandy, MD;  Location: [place];  Service: Vascular;;  . INSERTION OF DIALYSIS CATHETER Right 08/07/2017   Procedure: INSERTION OF DIALYSIS CATHETER;  Surgeon: Waynetta Sandy, MD;  Location: [place];  Service: Vascular;  Laterality: Right;  . INSERTION OF DIALYSIS CATHETER Right 12/21/2017   Procedure: RIGHT FEMORAL DIALYSIS CATHETER EXCHANGE;  Surgeon: Marty Heck, MD;  Location: [place];  Service: Vascular;  Laterality: Right;  . INSERTION OF ILIAC STENT Left 01/14/2016   Procedure: INSERTION OF ILIAC STENT;  Surgeon: Waynetta Sandy, MD;  Location: [place];  Service: Vascular;  Laterality: Left;  . INTRAOPERATIVE ARTERIOGRAM Left 01/14/2016   Procedure: INTRA OPERATIVE ARTERIOGRAM;  Surgeon: Waynetta Sandy, MD;  Location: [place];  Service: Vascular;  Laterality: Left;  . IR NEPHROSTOMY EXCHANGE LEFT  03/21/2019  . IR NEPHROSTOMY PLACEMENT LEFT  11/28/2018  . IRRIGATION AND DEBRIDEMENT EXTREMITY (Right   12/09/2019  . KNEE ARTHROSCOPY Left 10/07/2019   Procedure: ARTHROSCOPY KNEE w/ STIMULATING  BEAD PLACMENT;  Surgeon: Meredith Pel, MD;  Location: [place];  Service: Orthopedics;  Laterality: Left;  . LEFT HEART CATH AND CORONARY ANGIOGRAPHY N/A 10/17/2019   Procedure: LEFT HEART CATH AND CORONARY ANGIOGRAPHY;  Surgeon: Jettie Booze, MD;  Location: [place] CV LAB;  Service: Cardiovascular;  Laterality: N/A;  . LITHOTRIPSY  x 3   "& a laser treatment" (02/29/2016)  . NEPHROLITHOTOMY Left 03/26/2019    Procedure: NEPHROLITHOTOMY PERCUTANEOUS;  Surgeon: Alexis Frock, MD;  Location: [facility];  Service: Urology;  Laterality: Left;  2 HRS  . NEPHROSTOMY Bilateral 1968  . PATELLAR TENDON REPAIR Right 1990s   "big incision"  . PERIPHERAL VASCULAR CATHETERIZATION  01/14/2016   Procedure: A/V SHUNTOGRAM;  Surgeon: Waynetta Sandy, MD;  Location: [facility] OR;  Service: Vascular;;  . REMOVAL OF GRAFT Left 01/30/2020   Procedure: REMOVAL OF INFECTED AV GRAFT LEFT THIGH;  Surgeon: Serafina Mitchell, MD;  Location: [place];  Service: Vascular;  Laterality: Left;  . REVISION OF ARTERIOVENOUS GORETEX GRAFT Left 10/16/2012   Procedure: REVISION OF LEFT FEMORAL LOOP ARTERIOVENOUS GORETEX GRAFT;  Surgeon: Rosetta Posner, MD;  Location: [place];  Service: Vascular;  Laterality: Left;  . REVISION OF ARTERIOVENOUS GORETEX GRAFT Left 02/11/2015   Procedure: EXCISION OF SMALL SEGMENT OF EXPOSED LEFT THIGH NON FUNCTIONING  ARTERIOVENOUS GORETEX GRAFT;  Surgeon:  Mal Misty, MD;  Location: [place];  Service: Vascular;  Laterality: Left;  . REVISION OF ARTERIOVENOUS GORETEX GRAFT Left 01/14/2016   Procedure: REVISION OF ARTERIOVENOUS GORETEX GRAFT;  Surgeon: Waynetta Sandy, MD;  Location: [place];  Service: Vascular;  Laterality: Left;  . REVISION OF ARTERIOVENOUS GORETEX GRAFT Left 02/29/2016   thigh/pt report  . REVISION OF ARTERIOVENOUS GORETEX GRAFT Left 02/29/2016   Procedure: POSSIBLE REVISION OF LEFT THIGH ARTERIOVENOUS GORETEX GRAFT;  Surgeon: Waynetta Sandy, MD;  Location: [place];  Service: Vascular;  Laterality: Left;  . TEE WITHOUT CARDIOVERSION N/A 12/14/2017   Procedure: TRANSESOPHAGEAL ECHOCARDIOGRAM (TEE);  Surgeon: Acie Fredrickson, Wonda Cheng, MD;  Location: [place];  Service: Cardiovascular;  Laterality: N/A;  . THROMBECTOMY AND REVISION OF ARTERIOVENTOUS (AV) GORETEX  GRAFT Left 06/12/2018   Procedure: Removal of infected left thigh graft;  Surgeon: Rosetta Posner, MD;  Location: [place];  Service:  Vascular;  Laterality: Left;  . THROMBECTOMY W/ EMBOLECTOMY Left 01/14/2016   Procedure: THROMBECTOMY ARTERIOVENOUS GORE-TEX Left thigh GRAFT;  Surgeon: Waynetta Sandy, MD;  Location: [place];  Service: Vascular;  Laterality: Left;  . TONGUE SURGERY  ~ 1990   tongue-tie release   . ULTRASOUND GUIDANCE FOR VASCULAR ACCESS Right 08/07/2017   Procedure: ULTRASOUND GUIDANCE FOR VASCULAR CANNULATION RIGHT FEMORAL VEIN AND LEFT AV FEMORAL GRAFT;  Surgeon: Waynetta Sandy, MD;  Location: [place];  Service: Vascular;  Laterality: Right;  . UPPER EXTREMITY VENOGRAPHY Bilateral 08/09/2017   Procedure: UPPER EXTREMITY VENOGRAPHY;  Surgeon: Serafina Mitchell, MD;  Location: [place] CV LAB;  Service: Cardiovascular;  Laterality: Bilateral;  . VENOGRAM N/A 09/20/2017   Procedure: RIGHT COMMON FEMORAL ARTERY EXPLORATION.  CANNULATION RIGHT COMMON FEMORAL VEIN. VENOGRAM CENTRAL ULTRA SOUND GUIDED RIGHT FEMORAL VEIN TIMES TWO.;  Surgeon: Waynetta Sandy, MD;  Location: [place];  Service: Vascular;  Laterality: N/A;  . VENOGRAM Right 10/04/2019   Procedure: VENOGRAM;  Surgeon: Serafina Mitchell, MD;  Location: [place];  Service: Vascular;  Laterality: Right;  . WOUND DEBRIDEMENT Left 02/29/2016   thigh    Social History   Socioeconomic History  . Marital status: Single    Spouse name: Not on file  . Number of children: 0  . Years of education: Not on file  . Highest education level: Not on file  Occupational History  . Occupation: Disabled  Tobacco Use  . Smoking status: Never Smoker  . Smokeless tobacco: Never Used  Vaping Use  . Vaping Use: Never used  Substance and Sexual Activity  . Alcohol use: Not Currently    Alcohol/week: 0.0 standard drinks    Comment: 02/29/2016 "nothing since  the mid 1990s  . Drug use: Not Currently    Types: Marijuana    Comment: 02/29/2016 "nothing since ~ 1995"  . Sexual activity: Yes  Other Topics Concern  . Not on file  Social History Narrative    Lives with sister    Social Determinants of Health   Financial Resource Strain: Not on file  Food Insecurity: Not on file  Transportation Needs: Not on file  Physical Activity: Not on file  Stress: Not on file  Social Connections: Not on file  Intimate Partner Violence: Not on file    Family History  Problem Relation Age of Onset  . Diabetes Mother   . Hypertension Mother   . Heart disease Mother        before age 52  . Diabetes Father   .  Heart attack Father        X's 3  . Diabetes Sister   . Bipolar disorder Sister     Current Outpatient Medications  Medication Sig Dispense Refill  . hydrocortisone 2.5 % cream Apply rectally 2 times daily    . Methoxy PEG-Epoetin Beta (MIRCERA IJ) Mircera    . acetaminophen (TYLENOL) 500 MG tablet Take 1,000 mg by mouth every 6 (six) hours as needed for mild pain or headache.    Marland Kitchen aspirin EC 81 MG EC tablet Take 1 tablet (81 mg total) by mouth daily. Swallow whole. 30 tablet 11  . calcium acetate (PHOSLO) 667 MG capsule Take 3-4 capsules (2,001-2,668 mg total) by mouth See admin instructions. Take 2708 mg by mouth 3 times daily with meals, take 2001 mg by mouth with snacks (Patient taking differently: No sig reported) 450 capsule 0  . calcium acetate (PHOSLO) 667 MG capsule Take 4 capsules (2,668 mg total) by mouth 3 (three) times daily with meals. 360 capsule 0  . cinacalcet (SENSIPAR) 30 MG tablet Take 1 tablet (30 mg total) by mouth every Monday, Wednesday, and Friday with hemodialysis. 60 tablet 0  . clopidogrel (PLAVIX) 75 MG tablet Take 75 mg by mouth daily.    . diclofenac Sodium (VOLTAREN) 1 % GEL Apply 2 g topically daily as needed (For leg pain).    Marland Kitchen doxercalciferol (HECTOROL) 4 MCG/2ML injection Inject 2 mLs (4 mcg total) into the vein every Monday, Wednesday, and Friday with hemodialysis. 2 mL 0  . escitalopram (LEXAPRO) 5 MG tablet Take 5 mg by mouth daily.    . famotidine (PEPCID) 40 MG tablet Take 40 mg by mouth daily.     . fentaNYL (DURAGESIC) 100 MCG/HR Place 1 patch onto the skin every 3 (three) days. 5 patch 0  . fluticasone (FLONASE) 50 MCG/ACT nasal spray Place 2 sprays into both nostrils as needed for allergies or rhinitis (congestion). (Patient taking differently: Place 2 sprays into both nostrils daily as needed for allergies or rhinitis (congestion).) 0.003 g 0  . levothyroxine (SYNTHROID) 137 MCG tablet Take 137 mcg by mouth daily before breakfast.    . lidocaine (LIDODERM) 5 % Place 2 patches onto the skin daily.    Marland Kitchen LORazepam (ATIVAN) 1 MG tablet Take 1 mg by mouth daily.    . Melatonin 10 MG TABS Take 10 mg by mouth at bedtime.    . Nutritional Supplements (NEPRO) LIQD Take 237 mLs by mouth daily. Mixed Berry    . oxyCODONE 10 MG TABS Take 1 tablet (10 mg total) by mouth every 6 (six) hours. 30 tablet 0  . oxyCODONE-acetaminophen (PERCOCET/ROXICET) 5-325 MG tablet Take 1 tablet by mouth every 6 (six) hours. 30 tablet 0  . polyethylene glycol (MIRALAX / GLYCOLAX) 17 g packet Take 17 g by mouth daily. 14 each 0  . promethazine (PHENERGAN) 25 MG tablet Take 1 tablet (25 mg total) by mouth 2 (two) times daily as needed for nausea or vomiting. 30 tablet 0  . rosuvastatin (CRESTOR) 10 MG tablet Take 1 tablet (10 mg total) by mouth daily. 30 tablet 0  . senna (SENOKOT) 8.6 MG TABS tablet Take 2 tablets (17.2 mg total) by mouth daily as needed for mild constipation. 120 tablet 0  . sodium polystyrene (KAYEXALATE) powder Take 15 g by mouth See admin instructions. Take 15 g (mixed in water) by mouth on Saturdays and Sundays 135 g 0  . traZODone (DESYREL) 100 MG tablet Take 1 tablet (100  mg total) by mouth at bedtime. 30 tablet 0  . zolpidem (AMBIEN) 10 MG tablet Take 1 tablet (10 mg total) by mouth at bedtime. 7 tablet 0   No current facility-administered medications for this visit.    Allergies  Allergen Reactions  . Hydrocodone Other (See Comments)    Caused involuntary movement and twitching. CANNOT  TAKE DUE TO MUSCLE SPASMS AND MUSCLE TREMORS Tolerates hydromorphone and oxycodone.  . Furadantin [Nitrofurantoin] Other (See Comments)    UNSPECIFIED REACTION   . Mandelamine [Methenamine] Other (See Comments)    UNSPECIFIED REACTION   . Noroxin [Norfloxacin] Other (See Comments)    UNSPECIFIED REACTION   . Colace [Docusate] Diarrhea and Nausea And Vomiting  . Elemental Sulfur Cough    Childhood reaction - pt could not confirm that it was a cough  . Sulfa Antibiotics Other (See Comments) and Cough    Childhood reaction - pt could not confirm that it was a cough     REVIEW OF SYSTEMS:   '[X]'$  denotes positive finding, '[ ]'$  denotes negative finding Cardiac  Comments:  Chest pain or chest pressure:    Shortness of breath upon exertion:    Short of breath when lying flat:    Irregular heart rhythm:        Vascular    Pain in calf, thigh, or hip brought on by ambulation:    Pain in feet at night that wakes you up from your sleep:     Blood clot in your veins:    Leg swelling:         Pulmonary    Oxygen at home:    Productive cough:     Wheezing:         Neurologic    Sudden weakness in arms or legs:     Sudden numbness in arms or legs:     Sudden onset of difficulty speaking or slurred speech:    Temporary loss of vision in one eye:     Problems with dizziness:         Gastrointestinal    Blood in stool:     Vomited blood:         Genitourinary    Burning when urinating:     Blood in urine:        Psychiatric    Major depression:         Hematologic    Bleeding problems:    Problems with blood clotting too easily:        Skin    Rashes or ulcers:        Constitutional    Fever or chills:      PHYSICAL EXAMINATION:  Vitals:   03/29/20 1522  BP: (!) 108/58  Pulse: 68  Resp: 20  Temp: 98.3 F (36.8 C)  TempSrc: Temporal  SpO2: 96%  Weight: 263 lb 7.2 oz (119.5 kg)  Height: '5\' 11"'$  (1.803 m)    General:  WDWN in NAD; vital signs documented  above Gait: Not observed HENT: WNL, normocephalic Pulmonary: normal non-labored breathing Cardiac: regular HR Abdomen: soft, NT, no masses Skin: without rashes Vascular Exam/Pulses:  Right Left  Radial 2+ (normal) 2+ (normal)  DP absent absent  PT absent absent   Extremities: Right thigh incisions well-healed; left groin incisions with small open area remaining approximately 1 cm in width and 4 cm in length without signs of infection Musculoskeletal: no muscle wasting or atrophy  Neurologic: A&O X 3;  No focal weakness  or paresthesias are detected Psychiatric:  The pt has Normal affect.     ASSESSMENT/PLAN:: 55 y.o. male here for incision check status post left thigh AV graft removal and right femoral TDC exchange  Right thigh incisions well-healed Left thigh incisions for AV graft removal nearly healed; we will switch from wet-to-dry to dry dressing changes; I also stressed the importance of daily to twice daily cleansing of left groin and incision with soap and water then pat dry Recheck left thigh incisions and 3 to 4 weeks to ensure complete healing   Dagoberto Ligas, PA-C [facility] [phone]  Clinic MD:   Trula Slade

## 2020-04-19 ENCOUNTER — Ambulatory Visit: Payer: Medicare Other

## 2020-04-21 ENCOUNTER — Telehealth: Payer: Self-pay

## 2020-04-21 NOTE — Telephone Encounter (Signed)
Keke from [facility] left a message saying pt having drainage from incision sites on his groin and back.  "A little odor" was noticed in both incisions.  Pt also complaining of numbness and tingling.  He has an appt to see [place] again for a wound check on 04/27/20.  Message sent to MD for advice.

## 2020-04-21 NOTE — Telephone Encounter (Signed)
Per Raul Del, She does not think it is urgent and should be able to wait until her next appt.

## 2020-04-21 NOTE — Telephone Encounter (Signed)
-----   Message from Dagoberto Ligas, PA-C sent at 04/21/2020 10:26 AM EDT ----- Regarding: RE: Wound Check He no showed his appointment on Monday.  4/26 is fine unless they think it is urgent Thanks Matt ----- Message ----- From: Kaleen Mask, LPN Sent: [id number]  10:15 AM EDT To: Dagoberto Ligas, PA-C Subject: Wound Check                                    Elson Clan.  Keke from [facility] left a message saying pt having drainage from incision sites on his groin and back.  "A little odor" was noticed in both incisions.  Pt also complaining of numbness and tingling.  He has an appt to see [place] again for a wound check on 04/27/20.  Is it ok for him to wait until then or should he come in sooner?  Please advise.  Thanks!  Cindi Ghazarian.

## 2020-04-27 ENCOUNTER — Encounter: Payer: Self-pay | Admitting: Physician Assistant

## 2020-04-27 ENCOUNTER — Other Ambulatory Visit: Payer: Self-pay

## 2020-04-27 ENCOUNTER — Ambulatory Visit (INDEPENDENT_AMBULATORY_CARE_PROVIDER_SITE_OTHER): Payer: Medicare Other | Admitting: Physician Assistant

## 2020-04-27 VITALS — BP 107/65 | HR 66 | Temp 98.6°F | Resp 20 | Ht 71.0 in | Wt 271.2 lb

## 2020-04-27 DIAGNOSIS — Z992 Dependence on renal dialysis: Secondary | ICD-10-CM

## 2020-04-27 DIAGNOSIS — N186 End stage renal disease: Secondary | ICD-10-CM

## 2020-04-27 DIAGNOSIS — T148XXA Other injury of unspecified body region, initial encounter: Secondary | ICD-10-CM

## 2020-04-27 NOTE — Progress Notes (Signed)
Office Note     CC:  follow up Requesting Provider:  [name], MD  HPI: [name] is a 55 y.o. (1965-03-30) male who presents for left groin incision check status post removal of infected nonfunctional left thigh AV graft by Dr. Trula Slade on 01/30/2020.  During same hospital admission right femoral TDC was exchanged involving cutdown on the collateral vein in which is placed.  He is receiving home health wound care for sacral wound as well as dressing changes for the left thigh incisions.  He states these are healing well.  He states there is continued minimal drainage however no sign of infection. He denies any fever or chills  His right thigh TDC is working well. He reports no issues. He has a complex dialysis access and believed to be without options for new access going further.  He will need a translumbar catheter if right femoral TDC becomes nonfunctional.  States that he has been having right shoulder and neck pain and worsening right sided headaches. He has had CT in January of his right shoulder showing some bilateral carotid atherosclerosis and subclavian artery thrombus. He expresses concerns about this and would like [place] to evaluate this further  Past Medical History:  Diagnosis Date  . Anemia   . Anxiety   . Clotted dialysis access ([place]) 01/06/2020  . Constipation   . End stage renal failure on dialysis [facility])    [place]; MWF, on HD since 1997, exhausted upper ext access, and possibly LE access; cath dependent in R groin as of 06/2018  . GERD (gastroesophageal reflux disease)   . Grand mal seizure ([place]) X 4   last 1998 (02/29/2016)  . History of blood transfusion 2000s   "I was having blood loss; never found out from where"  . History of kidney stones   . Hypertension    hx of - has not taken bp meds in over 2 years  since dialysis  . Hypothyroidism   . Insomnia   . Migraine    "due to my BP in my late 1990s; none since" (02/29/2016)  . Nonhealing surgical  wound    of the left arteriovenous graft  . Severe aortic stenosis    no problems with per pt. - nephrologist and Dr. Coletta Memos  . Spina bifida [facility])     Past Surgical History:  Procedure Laterality Date  . A/V FISTULAGRAM Right 02/03/2020   Procedure: A/V Fistulagram;  Surgeon: Serafina Mitchell, MD;  Location: [place] [facility];  Service: Cardiovascular;  Laterality: Right;  . AMPUTATION Left 12/11/2017   Procedure: Left Great Toe Amputation;  Surgeon: [name], MD;  Location: [place];  Service: Orthopedics;  Laterality: Left;  . APPENDECTOMY    . APPLICATION OF WOUND VAC Left 10/04/2019   Procedure: APPLICATION OF WOUND VAC;  Surgeon: Serafina Mitchell, MD;  Location: [place];  Service: Vascular;  Laterality: Left;  . ARTERIOVENOUS GRAFT PLACEMENT Left 10/16/2012   left femoral goretex graft         Dr Donnetta Hutching  . AV FISTULA PLACEMENT Left 06/27/2012   Procedure: EXPLORATORY LEFT THY-GRAFT PSEUDO-ANEURYSM;  Surgeon: Conrad [place], MD;  Location: [place];  Service: Vascular;  Laterality: Left;  Revision of left Arteriovenus gortex graft in thigh.  Bertram Savin REMOVAL Left 10/04/2019   Procedure: REMOVAL OF ARTERIOVENOUS GORETEX GRAFT ([place]);  Surgeon: Serafina Mitchell, MD;  Location: [facility] OR;  Service: Vascular;  Laterality: Left;  . COLONOSCOPY    .  DRESSING CHANGE UNDER ANESTHESIA Left 10/07/2019   Procedure: DRESSING CHANGE UNDER ANESTHESIA;  Surgeon: Meredith Pel, MD;  Location: [place];  Service: Orthopedics;  Laterality: Left;  . EXCHANGE OF A DIALYSIS CATHETER Right 02/06/2020   Procedure: EXCHANGE OF TUNNELED DIALYSIS CATHETER;  Surgeon: Serafina Mitchell, MD;  Location: [place];  Service: Vascular;  Laterality: Right;  . FLEXIBLE SIGMOIDOSCOPY N/A 12/03/2018   Procedure: FLEXIBLE SIGMOIDOSCOPY;  Surgeon: Yetta Flock, MD;  Location: [place];  Service: Gastroenterology;  Laterality: N/A;  . I & D EXTREMITY Left 02/29/2016   Procedure: DEBRIDEMENT LEFT THIGH WOUND;  Surgeon: Waynetta Sandy, MD;  Location: [place];  Service: Vascular;  Laterality: Left;  . I & D EXTREMITY Left 10/04/2019   Procedure: IRRIGATION AND DEBRIDEMENT LEFT THIGH;  Surgeon: Serafina Mitchell, MD;  Location: [place];  Service: Vascular;  Laterality: Left;  . I & D EXTREMITY Right 12/09/2019   Procedure: IRRIGATION AND DEBRIDEMENT EXTREMITY;  Surgeon: Altamese Springer, MD;  Location: [place];  Service: Orthopedics;  Laterality: Right;  . ILEOSTOMY  1970s  . INCISION AND DRAINAGE Left 10/16/2012   Procedure: INCISION AND Debridement left thigh graft;  Surgeon: Rosetta Posner, MD;  Location: [place];  Service: Vascular;  Laterality: Left;  . INSERTION OF DIALYSIS CATHETER    . INSERTION OF DIALYSIS CATHETER  01/14/2016   Procedure: INSERTION OF DIALYSIS CATHETER;  Surgeon: Waynetta Sandy, MD;  Location: [place];  Service: Vascular;;  . INSERTION OF DIALYSIS CATHETER Right 08/07/2017   Procedure: INSERTION OF DIALYSIS CATHETER;  Surgeon: Waynetta Sandy, MD;  Location: [place];  Service: Vascular;  Laterality: Right;  . INSERTION OF DIALYSIS CATHETER Right 12/21/2017   Procedure: RIGHT FEMORAL DIALYSIS CATHETER EXCHANGE;  Surgeon: Marty Heck, MD;  Location: [place];  Service: Vascular;  Laterality: Right;  . INSERTION OF ILIAC STENT Left 01/14/2016   Procedure: INSERTION OF ILIAC STENT;  Surgeon: Waynetta Sandy, MD;  Location: [place];  Service: Vascular;  Laterality: Left;  . INTRAOPERATIVE ARTERIOGRAM Left 01/14/2016   Procedure: INTRA OPERATIVE ARTERIOGRAM;  Surgeon: Waynetta Sandy, MD;  Location: [place];  Service: Vascular;  Laterality: Left;  . IR NEPHROSTOMY EXCHANGE LEFT  03/21/2019  . IR NEPHROSTOMY PLACEMENT LEFT  11/28/2018  . IRRIGATION AND DEBRIDEMENT EXTREMITY (Right   12/09/2019  . KNEE ARTHROSCOPY Left 10/07/2019   Procedure: ARTHROSCOPY KNEE w/ STIMULATING  BEAD PLACMENT;  Surgeon: Meredith Pel, MD;  Location: [place];  Service: Orthopedics;  Laterality:  Left;  . LEFT HEART CATH AND CORONARY ANGIOGRAPHY N/A 10/17/2019   Procedure: LEFT HEART CATH AND CORONARY ANGIOGRAPHY;  Surgeon: Jettie Booze, MD;  Location: [place] [facility];  Service: Cardiovascular;  Laterality: N/A;  . LITHOTRIPSY  x 3   "& a laser treatment" (02/29/2016)  . NEPHROLITHOTOMY Left 03/26/2019   Procedure: NEPHROLITHOTOMY PERCUTANEOUS;  Surgeon: Alexis Frock, MD;  Location: [facility];  Service: Urology;  Laterality: Left;  2 HRS  . NEPHROSTOMY Bilateral 1968  . PATELLAR TENDON REPAIR Right 1990s   "big incision"  . PERIPHERAL VASCULAR CATHETERIZATION  01/14/2016   Procedure: A/V SHUNTOGRAM;  Surgeon: Waynetta Sandy, MD;  Location: [facility] OR;  Service: Vascular;;  . REMOVAL OF GRAFT Left 01/30/2020   Procedure: REMOVAL OF INFECTED AV GRAFT LEFT THIGH;  Surgeon: Serafina Mitchell, MD;  Location: [place];  Service: Vascular;  Laterality: Left;  . REVISION OF ARTERIOVENOUS GORETEX GRAFT Left 10/16/2012  Procedure: REVISION OF LEFT FEMORAL LOOP ARTERIOVENOUS GORETEX GRAFT;  Surgeon: Rosetta Posner, MD;  Location: [place];  Service: Vascular;  Laterality: Left;  . REVISION OF ARTERIOVENOUS GORETEX GRAFT Left 02/11/2015   Procedure: EXCISION OF SMALL SEGMENT OF EXPOSED LEFT THIGH NON FUNCTIONING  ARTERIOVENOUS GORETEX GRAFT;  Surgeon: Mal Misty, MD;  Location: [place];  Service: Vascular;  Laterality: Left;  . REVISION OF ARTERIOVENOUS GORETEX GRAFT Left 01/14/2016   Procedure: REVISION OF ARTERIOVENOUS GORETEX GRAFT;  Surgeon: Waynetta Sandy, MD;  Location: [place];  Service: Vascular;  Laterality: Left;  . REVISION OF ARTERIOVENOUS GORETEX GRAFT Left 02/29/2016   thigh/pt report  . REVISION OF ARTERIOVENOUS GORETEX GRAFT Left 02/29/2016   Procedure: POSSIBLE REVISION OF LEFT THIGH ARTERIOVENOUS GORETEX GRAFT;  Surgeon: Waynetta Sandy, MD;  Location: [place];  Service: Vascular;  Laterality: Left;  . TEE WITHOUT CARDIOVERSION N/A 12/14/2017   Procedure:  TRANSESOPHAGEAL ECHOCARDIOGRAM (TEE);  Surgeon: Acie Fredrickson, Wonda Cheng, MD;  Location: [place];  Service: Cardiovascular;  Laterality: N/A;  . THROMBECTOMY AND REVISION OF ARTERIOVENTOUS (AV) GORETEX  GRAFT Left 06/12/2018   Procedure: Removal of infected left thigh graft;  Surgeon: Rosetta Posner, MD;  Location: [place];  Service: Vascular;  Laterality: Left;  . THROMBECTOMY W/ EMBOLECTOMY Left 01/14/2016   Procedure: THROMBECTOMY ARTERIOVENOUS GORE-TEX Left thigh GRAFT;  Surgeon: Waynetta Sandy, MD;  Location: [place];  Service: Vascular;  Laterality: Left;  . TONGUE SURGERY  ~ 1990   tongue-tie release   . ULTRASOUND GUIDANCE FOR VASCULAR ACCESS Right 08/07/2017   Procedure: ULTRASOUND GUIDANCE FOR VASCULAR CANNULATION RIGHT FEMORAL VEIN AND LEFT AV FEMORAL GRAFT;  Surgeon: Waynetta Sandy, MD;  Location: [place];  Service: Vascular;  Laterality: Right;  . UPPER EXTREMITY VENOGRAPHY Bilateral 08/09/2017   Procedure: UPPER EXTREMITY VENOGRAPHY;  Surgeon: Serafina Mitchell, MD;  Location: [place] [facility];  Service: Cardiovascular;  Laterality: Bilateral;  . VENOGRAM N/A 09/20/2017   Procedure: RIGHT COMMON FEMORAL ARTERY EXPLORATION.  CANNULATION RIGHT COMMON FEMORAL VEIN. VENOGRAM CENTRAL ULTRA SOUND GUIDED RIGHT FEMORAL VEIN TIMES TWO.;  Surgeon: Waynetta Sandy, MD;  Location: [place];  Service: Vascular;  Laterality: N/A;  . VENOGRAM Right 10/04/2019   Procedure: VENOGRAM;  Surgeon: Serafina Mitchell, MD;  Location: [place];  Service: Vascular;  Laterality: Right;  . WOUND DEBRIDEMENT Left 02/29/2016   thigh    Social History   Socioeconomic History  . Marital status: Single    Spouse name: Not on file  . Number of children: 0  . Years of education: Not on file  . Highest education level: Not on file  Occupational History  . Occupation: Disabled  Tobacco Use  . Smoking status: Never Smoker  . Smokeless tobacco: Never Used  Vaping Use  . Vaping Use: Never used   Substance and Sexual Activity  . Alcohol use: Not Currently    Alcohol/week: 0.0 standard drinks    Comment: 02/29/2016 "nothing since  the mid 1990s  . Drug use: Not Currently    Types: Marijuana    Comment: 02/29/2016 "nothing since ~ 1995"  . Sexual activity: Yes  Other Topics Concern  . Not on file  Social History Narrative   Lives with sister    Social Determinants of Health   Financial Resource Strain: Not on file  Food Insecurity: Not on file  Transportation Needs: Not on file  Physical Activity: Not on file  Stress: Not on file  Social  Connections: Not on file  Intimate Partner Violence: Not on file    Family History  Problem Relation Age of Onset  . Diabetes Mother   . Hypertension Mother   . Heart disease Mother        before age 56  . Diabetes Father   . Heart attack Father        X's 3  . Diabetes Sister   . Bipolar disorder Sister     Current Outpatient Medications  Medication Sig Dispense Refill  . acetaminophen (TYLENOL) 500 MG tablet Take 1,000 mg by mouth every 6 (six) hours as needed for mild pain or headache.    Marland Kitchen aspirin EC 81 MG EC tablet Take 1 tablet (81 mg total) by mouth daily. Swallow whole. 30 tablet 11  . calcium acetate (PHOSLO) 667 MG capsule Take 3-4 capsules (2,001-2,668 mg total) by mouth See admin instructions. Take 2708 mg by mouth 3 times daily with meals, take 2001 mg by mouth with snacks (Patient taking differently: No sig reported) 450 capsule 0  . calcium acetate (PHOSLO) 667 MG capsule Take 4 capsules (2,668 mg total) by mouth 3 (three) times daily with meals. 360 capsule 0  . cinacalcet (SENSIPAR) 30 MG tablet Take 1 tablet (30 mg total) by mouth every Monday, Wednesday, and Friday with hemodialysis. 60 tablet 0  . clopidogrel (PLAVIX) 75 MG tablet Take 75 mg by mouth daily.    . diclofenac Sodium (VOLTAREN) 1 % GEL Apply 2 g topically daily as needed (For leg pain).    Marland Kitchen doxercalciferol (HECTOROL) 4 MCG/2ML injection Inject 2  mLs (4 mcg total) into the vein every Monday, Wednesday, and Friday with hemodialysis. 2 mL 0  . escitalopram (LEXAPRO) 5 MG tablet Take 5 mg by mouth daily.    . famotidine (PEPCID) 40 MG tablet Take 40 mg by mouth daily.    . fentaNYL (DURAGESIC) 100 MCG/HR Place 1 patch onto the skin every 3 (three) days. 5 patch 0  . fluticasone (FLONASE) 50 MCG/ACT nasal spray Place 2 sprays into both nostrils as needed for allergies or rhinitis (congestion). (Patient taking differently: Place 2 sprays into both nostrils daily as needed for allergies or rhinitis (congestion).) 0.003 g 0  . hydrocortisone 2.5 % cream Apply rectally 2 times daily    . levothyroxine (SYNTHROID) 137 MCG tablet Take 137 mcg by mouth daily before breakfast.    . lidocaine (LIDODERM) 5 % Place 2 patches onto the skin daily.    Marland Kitchen LORazepam (ATIVAN) 1 MG tablet Take 1 mg by mouth daily.    . Melatonin 10 MG TABS Take 10 mg by mouth at bedtime.    . Methoxy PEG-Epoetin Beta (MIRCERA IJ) Mircera    . Nutritional Supplements (NEPRO) LIQD Take 237 mLs by mouth daily. Mixed Berry    . oxyCODONE 10 MG TABS Take 1 tablet (10 mg total) by mouth every 6 (six) hours. 30 tablet 0  . oxyCODONE-acetaminophen (PERCOCET/ROXICET) 5-325 MG tablet Take 1 tablet by mouth every 6 (six) hours. 30 tablet 0  . polyethylene glycol (MIRALAX / GLYCOLAX) 17 g packet Take 17 g by mouth daily. 14 each 0  . promethazine (PHENERGAN) 25 MG tablet Take 1 tablet (25 mg total) by mouth 2 (two) times daily as needed for nausea or vomiting. 30 tablet 0  . rosuvastatin (CRESTOR) 10 MG tablet Take 1 tablet (10 mg total) by mouth daily. 30 tablet 0  . senna (SENOKOT) 8.6 MG TABS tablet Take 2 tablets (  17.2 mg total) by mouth daily as needed for mild constipation. 120 tablet 0  . sodium polystyrene (KAYEXALATE) powder Take 15 g by mouth See admin instructions. Take 15 g (mixed in water) by mouth on Saturdays and Sundays 135 g 0  . traZODone (DESYREL) 100 MG tablet Take 1  tablet (100 mg total) by mouth at bedtime. 30 tablet 0  . zolpidem (AMBIEN) 10 MG tablet Take 1 tablet (10 mg total) by mouth at bedtime. 7 tablet 0   No current facility-administered medications for this visit.    Allergies  Allergen Reactions  . Hydrocodone Other (See Comments)    Caused involuntary movement and twitching. CANNOT TAKE DUE TO MUSCLE SPASMS AND MUSCLE TREMORS Tolerates hydromorphone and oxycodone.  . Furadantin [Nitrofurantoin] Other (See Comments)    UNSPECIFIED REACTION   . Mandelamine [Methenamine] Other (See Comments)    UNSPECIFIED REACTION   . Noroxin [Norfloxacin] Other (See Comments)    UNSPECIFIED REACTION   . Colace [Docusate] Diarrhea and Nausea And Vomiting  . Elemental Sulfur Cough    Childhood reaction - pt could not confirm that it was a cough  . Sulfa Antibiotics Other (See Comments) and Cough    Childhood reaction - pt could not confirm that it was a cough     REVIEW OF SYSTEMS:  '[X]'$  denotes positive finding, '[ ]'$  denotes negative finding Cardiac  Comments:  Chest pain or chest pressure:    Shortness of breath upon exertion:    Short of breath when lying flat:    Irregular heart rhythm:        Vascular    Pain in calf, thigh, or hip brought on by ambulation:    Pain in feet at night that wakes you up from your sleep:     Blood clot in your veins:    Leg swelling:         Pulmonary    Oxygen at home:    Productive cough:     Wheezing:         Neurologic    Sudden weakness in arms or legs:     Sudden numbness in arms or legs:     Sudden onset of difficulty speaking or slurred speech:    Temporary loss of vision in one eye:     Problems with dizziness:         Gastrointestinal    Blood in stool:     Vomited blood:         Genitourinary    Burning when urinating:     Blood in urine:        Psychiatric    Major depression:         Hematologic    Bleeding problems:    Problems with blood clotting too easily:        Skin     Rashes or ulcers:        Constitutional    Fever or chills:      PHYSICAL EXAMINATION:  Vitals:   04/27/20 1526  BP: 107/65  Pulse: 66  Resp: 20  Temp: 98.6 F (37 C)  TempSrc: Temporal  SpO2: 97%  Weight: 271 lb 2.7 oz (123 kg)  Height: '5\' 11"'$  (1.803 m)    General:  WDWN in NAD; vital signs documented above Gait: Not observed, in wheel chair HENT: WNL, normocephalic Pulmonary: normal non-labored breathing , without wheezing Cardiac: regular HR, without  Murmurs with carotid bruit bilaterally Abdomen: soft, NT, no masses Vascular  Exam/Pulses: 2+ radial pulses bilaterally Extremities: without ischemic changes, without Gangrene , without cellulitis; with open wounds   left groin incision with .25 cm open area in proximal incision with hypergranulation of tissue. No signs of infection. No drainage. No erythema Musculoskeletal: no muscle wasting or atrophy  Neurologic: A&O X 3;  No focal weakness or paresthesias are detected Psychiatric:  The pt has Normal affect.   ASSESSMENT/PLAN:: 55 y.o. male here for follow up for wound check of left groin s/p excision of left thigh AV graft 01/30/20 by Dr. Trula Slade. Left thigh incision is almost completely healed. Very small area in proximal incision that is still healing. Continue daily dressing changes and clean with soap and water. Right thigh TDC is working well. - Will have him follow up one more time for wound check to evaluate for complete healing -Will schedule him for carotid duplex to evaluate for possible carotid stenosis when he comes back in a couple weeks for his wound check - At his request he would like to see Dr. Trula Slade or Dr. Donzetta Matters when he returns for follow up   Karoline Caldwell, PA-C [facility] [phone]  Clinic MD:  Roxanne Mins

## 2020-05-07 ENCOUNTER — Other Ambulatory Visit: Payer: Self-pay | Admitting: *Deleted

## 2020-05-07 DIAGNOSIS — I6529 Occlusion and stenosis of unspecified carotid artery: Secondary | ICD-10-CM

## 2020-05-24 ENCOUNTER — Other Ambulatory Visit: Payer: Self-pay

## 2020-05-24 ENCOUNTER — Telehealth: Payer: Self-pay | Admitting: Family Medicine

## 2020-05-24 ENCOUNTER — Encounter: Payer: Self-pay | Admitting: Surgery

## 2020-05-24 ENCOUNTER — Ambulatory Visit (HOSPITAL_COMMUNITY)
Admission: RE | Admit: 2020-05-24 | Discharge: 2020-05-24 | Disposition: A | Discharge status: Home / Self Care | Payer: Medicare Other | Source: Ambulatory Visit | Attending: Surgery | Admitting: Surgery

## 2020-05-24 ENCOUNTER — Ambulatory Visit (INDEPENDENT_AMBULATORY_CARE_PROVIDER_SITE_OTHER): Payer: Medicare Other | Admitting: Surgery

## 2020-05-24 VITALS — BP 108/70 | HR 67 | Temp 97.9°F | Resp 20 | Ht 71.0 in | Wt 271.0 lb

## 2020-05-24 DIAGNOSIS — I6529 Occlusion and stenosis of unspecified carotid artery: Secondary | ICD-10-CM | POA: Insufficient documentation

## 2020-05-24 DIAGNOSIS — N186 End stage renal disease: Secondary | ICD-10-CM

## 2020-05-24 DIAGNOSIS — Z992 Dependence on renal dialysis: Secondary | ICD-10-CM | POA: Diagnosis not present

## 2020-05-24 NOTE — Telephone Encounter (Signed)
   [name] DOB: 09/13/1965 MRN: [id number]   RIDER WAIVER AND RELEASE OF LIABILITY  For purposes of improving physical access to our facilities, [facility] is pleased to partner with third parties to provide [facility] patients or other authorized individuals the option of convenient, on-demand ground transportation services (the [place]") through use of the technology service that enables users to request on-demand ground transportation from independent third-party providers.  By opting to use and accept these [employer], I, the undersigned, hereby agree on behalf of myself, and on behalf of any minor child using the [employer] for whom I am the parent or legal guardian, as follows:  1. Government social research officer provided to me are provided by independent third-party transportation providers who are not [employer] or employees and who are unaffiliated with [employer]. 2. [facility] is neither a transportation carrier nor a common or public carrier. 3. [facility] has no control over the quality or safety of the transportation that occurs as a result of the [employer]. 4. [facility] cannot guarantee that any third-party transportation provider will complete any arranged transportation service. 5. [facility] makes no representation, warranty, or guarantee regarding the reliability, timeliness, quality, safety, suitability, or availability of any of the Transport Services or that they will be error free. 6. I fully understand that traveling by vehicle involves risks and dangers of serious bodily injury, including permanent disability, paralysis, and death. I agree, on behalf of myself and on behalf of any minor child using the Transport Services for whom I am the parent or legal guardian, that the entire risk arising out of my use of the [employer] remains solely with me, to the maximum extent permitted under applicable law. 7. The [employer] are provided "as is" and "as available." [facility] disclaims all representations and warranties, express, implied or statutory, not expressly set out in these terms, including the implied warranties of merchantability and fitness for a particular purpose. 8. I hereby waive and release [facility], its agents, employees, officers, directors, representatives, insurers, attorneys, assigns, successors, subsidiaries, and affiliates from any and all past, present, or future claims, demands, liabilities, actions, causes of action, or suits of any kind directly or indirectly arising from acceptance and use of the [employer]. 9. I further waive and release [facility] and its affiliates from all present and future liability and responsibility for any injury or death to persons or damages to property caused by or related to the use of the [employer]. 10. I have read this Waiver and Release of Liability, and I understand the terms used in it and their legal significance. This Waiver is freely and voluntarily given with the understanding that my right (as well as the right of any minor child for whom I am the parent or legal guardian using the [employer]) to legal recourse against [facility] in connection with the [employer] is knowingly surrendered in return for use of these services.   I attest that I read the consent document to [name], gave [name] the opportunity to ask questions and answered the questions asked (if any). I affirm that [name] then provided consent for he's participation in this program.     [name]

## 2020-05-24 NOTE — Progress Notes (Signed)
Patient name: Amante Kosek MRN: [id number] DOB: 07/24/1965 Sex: male  REASON FOR VISIT:     follow up  HISTORY OF PRESENT ILLNESS:   Hendrik Kor is a 55 y.o. male with a very complicated dialysis access history who is now catheter dependent on his last catheter in his right thigh.  He has also recently had graft removed out of his left thigh for abscess.  He complains of numbness in his left thumb index and middle finger and some pain and headaches in his right neck.  CURRENT MEDICATIONS:    Current Outpatient Medications  Medication Sig Dispense Refill  . acetaminophen (TYLENOL) 500 MG tablet Take 1,000 mg by mouth every 6 (six) hours as needed for mild pain or headache.    Marland Kitchen aspirin EC 81 MG EC tablet Take 1 tablet (81 mg total) by mouth daily. Swallow whole. 30 tablet 11  . calcium acetate (PHOSLO) 667 MG capsule Take 3-4 capsules (2,001-2,668 mg total) by mouth See admin instructions. Take 2708 mg by mouth 3 times daily with meals, take 2001 mg by mouth with snacks (Patient taking differently: Take 2,668 mg by mouth 3 (three) times daily with meals.) 450 capsule 0  . cinacalcet (SENSIPAR) 30 MG tablet Take 1 tablet (30 mg total) by mouth every Monday, Wednesday, and Friday with hemodialysis. 60 tablet 0  . clopidogrel (PLAVIX) 75 MG tablet Take 75 mg by mouth daily.    . diclofenac Sodium (VOLTAREN) 1 % GEL Apply 2 g topically daily as needed (For leg pain).    Marland Kitchen doxercalciferol (HECTOROL) 4 MCG/2ML injection Inject 2 mLs (4 mcg total) into the vein every Monday, Wednesday, and Friday with hemodialysis. 2 mL 0  . escitalopram (LEXAPRO) 5 MG tablet Take 5 mg by mouth daily.    . famotidine (PEPCID) 40 MG tablet Take 40 mg by mouth daily.    . fentaNYL (DURAGESIC) 100 MCG/HR Place 1 patch onto the skin every 3 (three) days. 5 patch 0  . fluticasone (FLONASE) 50 MCG/ACT nasal spray Place 2 sprays into both nostrils as needed for allergies or  rhinitis (congestion). (Patient taking differently: Place 2 sprays into both nostrils daily as needed for allergies or rhinitis (congestion).) 0.003 g 0  . furosemide (LASIX) 80 MG tablet Take 80 mg by mouth 2 (two) times daily as needed.    . hydrocortisone 2.5 % cream Apply rectally 2 times daily    . levothyroxine (SYNTHROID) 137 MCG tablet Take 137 mcg by mouth daily before breakfast.    . lidocaine (LIDODERM) 5 % Place 2 patches onto the skin daily.    Marland Kitchen LORazepam (ATIVAN) 1 MG tablet Take 1 mg by mouth daily.    . Melatonin 10 MG TABS Take 10 mg by mouth at bedtime.    . Methoxy PEG-Epoetin Beta (MIRCERA IJ) Mircera    . Nutritional Supplements (NEPRO) LIQD Take 237 mLs by mouth daily. Mixed Berry    . oxyCODONE 10 MG TABS Take 1 tablet (10 mg total) by mouth every 6 (six) hours. 30 tablet 0  . oxyCODONE-acetaminophen (PERCOCET/ROXICET) 5-325 MG tablet Take 1 tablet by mouth every 6 (six) hours. 30 tablet 0  . polyethylene glycol (MIRALAX / GLYCOLAX) 17 g packet Take 17 g by mouth daily. 14 each 0  . promethazine (PHENERGAN) 25 MG tablet Take 1 tablet (25 mg total) by mouth 2 (two) times daily as needed for nausea or vomiting. 30 tablet 0  . senna (SENOKOT) 8.6 MG TABS tablet Take  2 tablets (17.2 mg total) by mouth daily as needed for mild constipation. 120 tablet 0  . sodium polystyrene (KAYEXALATE) powder Take 15 g by mouth See admin instructions. Take 15 g (mixed in water) by mouth on Saturdays and Sundays 135 g 0  . traZODone (DESYREL) 100 MG tablet Take 1 tablet (100 mg total) by mouth at bedtime. 30 tablet 0  . zolpidem (AMBIEN) 10 MG tablet Take 1 tablet (10 mg total) by mouth at bedtime. 7 tablet 0  . calcium acetate (PHOSLO) 667 MG capsule Take 4 capsules (2,668 mg total) by mouth 3 (three) times daily with meals. 360 capsule 0  . rosuvastatin (CRESTOR) 10 MG tablet Take 1 tablet (10 mg total) by mouth daily. 30 tablet 0   No current facility-administered medications for this  visit.    REVIEW OF SYSTEMS:   '[X]'$  denotes positive finding, '[ ]'$  denotes negative finding Cardiac  Comments:  Chest pain or chest pressure:    Shortness of breath upon exertion:    Short of breath when lying flat:    Irregular heart rhythm:    Constitutional    Fever or chills:      PHYSICAL EXAM:   Vitals:   05/24/20 1508  BP: 108/70  Pulse: 67  Resp: 20  Temp: 97.9 F (36.6 C)  SpO2: 94%  Weight: 271 lb (122.9 kg)  Height: '5\' 11"'$  (1.803 m)    GENERAL: The patient is a well-nourished male, in no acute distress. The vital signs are documented above. CARDIOVASCULAR: There is a regular rate and rhythm. PULMONARY: Non-labored respirations All surgical incisions have completely healed  STUDIES:   I have reviewed the following carotid duplex:  Right Carotid: Velocities in the right ICA are consistent with a 1-39%  stenosis.   Left Carotid: Velocities in the left ICA are consistent with a 1-39%  stenosis.   Vertebrals: Bilateral vertebral arteries demonstrate antegrade flow.  Subclavians: Normal flow hemodynamics were seen in bilateral subclavian        arteries.          NOTE: No subclavian artery thrombosis visualized bilaterally   MEDICAL ISSUES:   I told him that I do not have an explanation for his headaches.  This could potentially be a neurologic issue or a cervical spine issue given his numbness in his left arm.  He will further discuss this with his medical doctors for the appropriate referral.  From a dialysis perspective everything remained stable.  Leia Alf, MD, FACS [facility] [phone] Pager [phone]

## 2020-11-15 DIAGNOSIS — R0602 Shortness of breath: Secondary | ICD-10-CM | POA: Insufficient documentation

## 2021-01-05 ENCOUNTER — Other Ambulatory Visit: Payer: Self-pay

## 2021-01-05 ENCOUNTER — Ambulatory Visit (INDEPENDENT_AMBULATORY_CARE_PROVIDER_SITE_OTHER): Payer: Medicare Other | Admitting: Physician Assistant

## 2021-01-05 VITALS — BP 110/80 | HR 77 | Temp 98.0°F | Resp 20 | Ht 71.0 in

## 2021-01-05 DIAGNOSIS — N186 End stage renal disease: Secondary | ICD-10-CM

## 2021-01-05 DIAGNOSIS — T80212S Local infection due to central venous catheter, sequela: Secondary | ICD-10-CM | POA: Diagnosis not present

## 2021-01-05 DIAGNOSIS — Z992 Dependence on renal dialysis: Secondary | ICD-10-CM

## 2021-01-05 NOTE — Progress Notes (Signed)
Office Note     CC:  follow up Requesting Provider:  [name], MD  HPI: [name] is a 56 y.o. (09/25/1965) male who presents with complaints of bleeding around right femoral TDC.  He has a complex dialysis access surgical history and is down to his last option.  He states over the past couple weeks he has had bleeding around his [facility].  He states this is related to a time when the nurse removed the bandage and accidentally pulled the catheter with it.  TDC is still functioning appropriately.  He denies fevers, chills, nausea/vomiting.   Past Medical History:  Diagnosis Date   Anemia    Anxiety    Clotted dialysis access [facility]) 01/06/2020   Constipation    End stage renal failure on dialysis [facility])    [place]; MWF, on HD since 1997, exhausted upper ext access, and possibly LE access; cath dependent in R groin as of 06/2018   GERD (gastroesophageal reflux disease)    Grand mal seizure ([place]) X 4   last 1998 (02/29/2016)   History of blood transfusion 2000s   "I was having blood loss; never found out from where"   History of kidney stones    Hypertension    hx of - has not taken bp meds in over 2 years  since dialysis   Hypothyroidism    Insomnia    Migraine    "due to my BP in my late 1990s; none since" (02/29/2016)   Nonhealing surgical wound    of the left arteriovenous graft   Severe aortic stenosis    no problems with per pt. - nephrologist and Dr. Coletta Memos   Spina bifida [facility])     Past Surgical History:  Procedure Laterality Date   A/V FISTULAGRAM Right 02/03/2020   Procedure: A/V Fistulagram;  Surgeon: Serafina Mitchell, MD;  Location: [place] [facility];  Service: Cardiovascular;  Laterality: Right;   AMPUTATION Left 12/11/2017   Procedure: Left Great Toe Amputation;  Surgeon: [name], MD;  Location: [place];  Service: Orthopedics;  Laterality: Left;   APPENDECTOMY     APPLICATION OF WOUND VAC Left 10/04/2019   Procedure: APPLICATION OF WOUND VAC;  Surgeon:  Serafina Mitchell, MD;  [facility];  Service: Vascular;  Laterality: Left;   ARTERIOVENOUS GRAFT PLACEMENT Left 10/16/2012   left femoral goretex graft         Dr Donnetta Hutching   AV FISTULA PLACEMENT Left 06/27/2012   Procedure: EXPLORATORY LEFT THY-GRAFT PSEUDO-ANEURYSM;  Surgeon: Conrad Meadowbrook, MD;  Location: [place];  Service: Vascular;  Laterality: Left;  Revision of left Arteriovenus gortex graft in thigh.   [place] REMOVAL Left 10/04/2019   Procedure: REMOVAL OF ARTERIOVENOUS GORETEX GRAFT ([place]);  Surgeon: Serafina Mitchell, MD;  Location: [place];  Service: Vascular;  Laterality: Left;   COLONOSCOPY     DRESSING CHANGE UNDER ANESTHESIA Left 10/07/2019   Procedure: DRESSING CHANGE UNDER ANESTHESIA;  Surgeon: Meredith Pel, MD;  Location: [place];  Service: Orthopedics;  Laterality: Left;   EXCHANGE OF A DIALYSIS CATHETER Right 02/06/2020   Procedure: EXCHANGE OF TUNNELED DIALYSIS CATHETER;  Surgeon: Serafina Mitchell, MD;  Location: [place];  Service: Vascular;  Laterality: Right;   FLEXIBLE SIGMOIDOSCOPY N/A 12/03/2018   Procedure: FLEXIBLE SIGMOIDOSCOPY;  Surgeon: Yetta Flock, MD;  Location: [place];  Service: Gastroenterology;  Laterality: N/A;   I & D EXTREMITY Left 02/29/2016   Procedure: DEBRIDEMENT LEFT THIGH WOUND;  Surgeon: Waynetta Sandy, MD;  Location: [place];  Service: Vascular;  Laterality: Left;   I & D EXTREMITY Left 10/04/2019   Procedure: IRRIGATION AND DEBRIDEMENT LEFT THIGH;  Surgeon: Serafina Mitchell, MD;  Location: [place];  Service: Vascular;  Laterality: Left;   I & D EXTREMITY Right 12/09/2019   Procedure: IRRIGATION AND DEBRIDEMENT EXTREMITY;  Surgeon: Altamese Shirley, MD;  Location: [place];  Service: Orthopedics;  Laterality: Right;   ILEOSTOMY  1970s   INCISION AND DRAINAGE Left 10/16/2012   Procedure: INCISION AND Debridement left thigh graft;  Surgeon: Rosetta Posner, MD;  Location: [place];  Service: Vascular;  Laterality: Left;   INSERTION OF DIALYSIS CATHETER      INSERTION OF DIALYSIS CATHETER  01/14/2016   Procedure: INSERTION OF DIALYSIS CATHETER;  Surgeon: Waynetta Sandy, MD;  Location: [place];  Service: Vascular;;   INSERTION OF DIALYSIS CATHETER Right 08/07/2017   Procedure: INSERTION OF DIALYSIS CATHETER;  Surgeon: Waynetta Sandy, MD;  Location: [place];  Service: Vascular;  Laterality: Right;   INSERTION OF DIALYSIS CATHETER Right 12/21/2017   Procedure: RIGHT FEMORAL DIALYSIS CATHETER EXCHANGE;  Surgeon: Marty Heck, MD;  Location: [place];  Service: Vascular;  Laterality: Right;   INSERTION OF ILIAC STENT Left 01/14/2016   Procedure: INSERTION OF ILIAC STENT;  Surgeon: Waynetta Sandy, MD;  Location: [place];  Service: Vascular;  Laterality: Left;   INTRAOPERATIVE ARTERIOGRAM Left 01/14/2016   Procedure: INTRA OPERATIVE ARTERIOGRAM;  Surgeon: Waynetta Sandy, MD;  Location: [place];  Service: Vascular;  Laterality: Left;   IR NEPHROSTOMY EXCHANGE LEFT  03/21/2019   IR NEPHROSTOMY PLACEMENT LEFT  11/28/2018   IRRIGATION AND DEBRIDEMENT EXTREMITY (Right   12/09/2019   KNEE ARTHROSCOPY Left 10/07/2019   Procedure: ARTHROSCOPY KNEE w/ STIMULATING  BEAD PLACMENT;  Surgeon: Meredith Pel, MD;  Location: [place];  Service: Orthopedics;  Laterality: Left;   LEFT HEART CATH AND CORONARY ANGIOGRAPHY N/A 10/17/2019   Procedure: LEFT HEART CATH AND CORONARY ANGIOGRAPHY;  Surgeon: Jettie Booze, MD;  Location: [place] [facility];  Service: Cardiovascular;  Laterality: N/A;   LITHOTRIPSY  x 3   "& a laser treatment" (02/29/2016)   NEPHROLITHOTOMY Left 03/26/2019   Procedure: NEPHROLITHOTOMY PERCUTANEOUS;  Surgeon: Alexis Frock, MD;  Location: [facility];  Service: Urology;  Laterality: Left;  2 HRS   NEPHROSTOMY Bilateral 1968   PATELLAR TENDON REPAIR Right 1990s   "big incision"   PERIPHERAL VASCULAR CATHETERIZATION  01/14/2016   Procedure: A/V SHUNTOGRAM;  Surgeon: Waynetta Sandy, MD;  Location: [place];   Service: Vascular;;   REMOVAL OF GRAFT Left 01/30/2020   Procedure: REMOVAL OF INFECTED AV GRAFT LEFT THIGH;  Surgeon: Serafina Mitchell, MD;  Location: [place];  Service: Vascular;  Laterality: Left;   REVISION OF ARTERIOVENOUS GORETEX GRAFT Left 10/16/2012   Procedure: REVISION OF LEFT FEMORAL LOOP ARTERIOVENOUS GORETEX GRAFT;  Surgeon: Rosetta Posner, MD;  Location: [place];  Service: Vascular;  Laterality: Left;   REVISION OF ARTERIOVENOUS GORETEX GRAFT Left 02/11/2015   Procedure: EXCISION OF SMALL SEGMENT OF EXPOSED LEFT THIGH NON FUNCTIONING  ARTERIOVENOUS GORETEX GRAFT;  Surgeon: Mal Misty, MD;  Location: [place];  Service: Vascular;  Laterality: Left;   REVISION OF ARTERIOVENOUS GORETEX GRAFT Left 01/14/2016   Procedure: REVISION OF ARTERIOVENOUS GORETEX GRAFT;  Surgeon: Waynetta Sandy, MD;  Location: [place];  Service: Vascular;  Laterality: Left;   REVISION OF ARTERIOVENOUS  GORETEX GRAFT Left 02/29/2016   thigh/pt report   REVISION OF ARTERIOVENOUS GORETEX GRAFT Left 02/29/2016   Procedure: POSSIBLE REVISION OF LEFT THIGH ARTERIOVENOUS GORETEX GRAFT;  Surgeon: Waynetta Sandy, MD;  Location: [place];  Service: Vascular;  Laterality: Left;   TEE WITHOUT CARDIOVERSION N/A 12/14/2017   Procedure: TRANSESOPHAGEAL ECHOCARDIOGRAM (TEE);  Surgeon: Acie Fredrickson, Wonda Cheng, MD;  Location: [place];  Service: Cardiovascular;  Laterality: N/A;   THROMBECTOMY AND REVISION OF ARTERIOVENTOUS (AV) GORETEX  GRAFT Left 06/12/2018   Procedure: Removal of infected left thigh graft;  Surgeon: Rosetta Posner, MD;  Location: [place];  Service: Vascular;  Laterality: Left;   THROMBECTOMY W/ EMBOLECTOMY Left 01/14/2016   Procedure: THROMBECTOMY ARTERIOVENOUS GORE-TEX Left thigh GRAFT;  Surgeon: Waynetta Sandy, MD;  Location: [place];  Service: Vascular;  Laterality: Left;   TONGUE SURGERY  ~ 1990   tongue-tie release    ULTRASOUND GUIDANCE FOR VASCULAR ACCESS Right 08/07/2017   Procedure: ULTRASOUND  GUIDANCE FOR VASCULAR CANNULATION RIGHT FEMORAL VEIN AND LEFT AV FEMORAL GRAFT;  Surgeon: Waynetta Sandy, MD;  Location: [place];  Service: Vascular;  Laterality: Right;   UPPER EXTREMITY VENOGRAPHY Bilateral 08/09/2017   Procedure: UPPER EXTREMITY VENOGRAPHY;  Surgeon: Serafina Mitchell, MD;  Location: [place] [facility];  Service: Cardiovascular;  Laterality: Bilateral;   VENOGRAM N/A 09/20/2017   Procedure: RIGHT COMMON FEMORAL ARTERY EXPLORATION.  CANNULATION RIGHT COMMON FEMORAL VEIN. VENOGRAM CENTRAL ULTRA SOUND GUIDED RIGHT FEMORAL VEIN TIMES TWO.;  Surgeon: Waynetta Sandy, MD;  Location: [facility];  Service: Vascular;  Laterality: N/A;   VENOGRAM Right 10/04/2019   Procedure: VENOGRAM;  Surgeon: Serafina Mitchell, MD;  [facility];  Service: Vascular;  Laterality: Right;   WOUND DEBRIDEMENT Left 02/29/2016   thigh    Social History   Socioeconomic History   Marital status: Single    Spouse name: Not on file   Number of children: 0   Years of education: Not on file   Highest education level: Not on file  Occupational History   Occupation: Disabled  Tobacco Use   Smoking status: Never   Smokeless tobacco: Never  Vaping Use   Vaping Use: Never used  Substance and Sexual Activity   Alcohol use: Not Currently    Alcohol/week: 0.0 standard drinks    Comment: 02/29/2016 "nothing since  the mid 1990s   Drug use: Not Currently    Types: Marijuana    Comment: 02/29/2016 "nothing since ~ 1995"   Sexual activity: Yes  Other Topics Concern   Not on file  Social History Narrative   Lives with sister    Social Determinants of Health   Financial Resource Strain: Not on file  Food Insecurity: Not on file  Transportation Needs: Not on file  Physical Activity: Not on file  Stress: Not on file  Social Connections: Not on file  Intimate Partner Violence: Not on file    Family History  Problem Relation Age of Onset   Diabetes Mother    Hypertension Mother    Heart  disease Mother        before age 72   Diabetes Father    Heart attack Father        X's 3   Diabetes Sister    Bipolar disorder Sister     Current Outpatient Medications  Medication Sig Dispense Refill   acetaminophen (TYLENOL) 500 MG tablet Take 1,000 mg by mouth every 6 (six) hours as needed for  mild pain or headache.     aspirin EC 81 MG EC tablet Take 1 tablet (81 mg total) by mouth daily. Swallow whole. 30 tablet 11   calcium acetate (PHOSLO) 667 MG capsule Take 3-4 capsules (2,001-2,668 mg total) by mouth See admin instructions. Take 2708 mg by mouth 3 times daily with meals, take 2001 mg by mouth with snacks (Patient taking differently: Take 2,668 mg by mouth 3 (three) times daily with meals.) 450 capsule 0   cinacalcet (SENSIPAR) 30 MG tablet Take 1 tablet (30 mg total) by mouth every Monday, Wednesday, and Friday with hemodialysis. 60 tablet 0   clopidogrel (PLAVIX) 75 MG tablet Take 75 mg by mouth daily.     diclofenac Sodium (VOLTAREN) 1 % GEL Apply 2 g topically daily as needed (For leg pain).     doxercalciferol (HECTOROL) 4 MCG/2ML injection Inject 2 mLs (4 mcg total) into the vein every Monday, Wednesday, and Friday with hemodialysis. 2 mL 0   escitalopram (LEXAPRO) 5 MG tablet Take 5 mg by mouth daily.     famotidine (PEPCID) 40 MG tablet Take 40 mg by mouth daily.     fentaNYL (DURAGESIC) 100 MCG/HR Place 1 patch onto the skin every 3 (three) days. 5 patch 0   fluticasone (FLONASE) 50 MCG/ACT nasal spray Place 2 sprays into both nostrils as needed for allergies or rhinitis (congestion). (Patient taking differently: Place 2 sprays into both nostrils daily as needed for allergies or rhinitis (congestion).) 0.003 g 0   furosemide (LASIX) 80 MG tablet Take 80 mg by mouth 2 (two) times daily as needed.     hydrocortisone 2.5 % cream Apply rectally 2 times daily     levothyroxine (SYNTHROID) 137 MCG tablet Take 137 mcg by mouth daily before breakfast.     lidocaine (LIDODERM) 5 %  Place 2 patches onto the skin daily.     LORazepam (ATIVAN) 1 MG tablet Take 1 mg by mouth daily.     Melatonin 10 MG TABS Take 10 mg by mouth at bedtime.     Methoxy PEG-Epoetin Beta (MIRCERA IJ) Mircera     Nutritional Supplements (NEPRO) LIQD Take 237 mLs by mouth daily. Mixed Berry     oxyCODONE 10 MG TABS Take 1 tablet (10 mg total) by mouth every 6 (six) hours. 30 tablet 0   oxyCODONE-acetaminophen (PERCOCET/ROXICET) 5-325 MG tablet Take 1 tablet by mouth every 6 (six) hours. 30 tablet 0   polyethylene glycol (MIRALAX / GLYCOLAX) 17 g packet Take 17 g by mouth daily. 14 each 0   promethazine (PHENERGAN) 25 MG tablet Take 1 tablet (25 mg total) by mouth 2 (two) times daily as needed for nausea or vomiting. 30 tablet 0   senna (SENOKOT) 8.6 MG TABS tablet Take 2 tablets (17.2 mg total) by mouth daily as needed for mild constipation. 120 tablet 0   sodium polystyrene (KAYEXALATE) powder Take 15 g by mouth See admin instructions. Take 15 g (mixed in water) by mouth on Saturdays and Sundays 135 g 0   traZODone (DESYREL) 100 MG tablet Take 1 tablet (100 mg total) by mouth at bedtime. 30 tablet 0   zolpidem (AMBIEN) 10 MG tablet Take 1 tablet (10 mg total) by mouth at bedtime. 7 tablet 0   calcium acetate (PHOSLO) 667 MG capsule Take 4 capsules (2,668 mg total) by mouth 3 (three) times daily with meals. 360 capsule 0   rosuvastatin (CRESTOR) 10 MG tablet Take 1 tablet (10 mg total) by mouth  daily. 30 tablet 0   No current facility-administered medications for this visit.    Allergies  Allergen Reactions   Hydrocodone Other (See Comments)    Caused involuntary movement and twitching. CANNOT TAKE DUE TO MUSCLE SPASMS AND MUSCLE TREMORS Tolerates hydromorphone and oxycodone.   Furadantin [Nitrofurantoin] Other (See Comments)    UNSPECIFIED REACTION    Mandelamine [Methenamine] Other (See Comments)    UNSPECIFIED REACTION    Noroxin [Norfloxacin] Other (See Comments)    UNSPECIFIED REACTION     Colace [Docusate] Diarrhea and Nausea And Vomiting   Elemental Sulfur Cough    Childhood reaction - pt could not confirm that it was a cough   Sulfa Antibiotics Other (See Comments) and Cough    Childhood reaction - pt could not confirm that it was a cough     REVIEW OF SYSTEMS:   [X]  denotes positive finding, [ ]  denotes negative finding Cardiac  Comments:  Chest pain or chest pressure:    Shortness of breath upon exertion:    Short of breath when lying flat:    Irregular heart rhythm:        Vascular    Pain in calf, thigh, or hip brought on by ambulation:    Pain in feet at night that wakes you up from your sleep:     Blood clot in your veins:    Leg swelling:         Pulmonary    Oxygen at home:    Productive cough:     Wheezing:         Neurologic    Sudden weakness in arms or legs:     Sudden numbness in arms or legs:     Sudden onset of difficulty speaking or slurred speech:    Temporary loss of vision in one eye:     Problems with dizziness:         Gastrointestinal    Blood in stool:     Vomited blood:         Genitourinary    Burning when urinating:     Blood in urine:        Psychiatric    Major depression:         Hematologic    Bleeding problems:    Problems with blood clotting too easily:        Skin    Rashes or ulcers:        Constitutional    Fever or chills:      PHYSICAL EXAMINATION:  Vitals:   01/05/21 1348  BP: 110/80  Pulse: 77  Resp: 20  Temp: 98 F (36.7 C)  TempSrc: Temporal  SpO2: 97%  Height: 5\' 11"  (1.803 m)    General:  WDWN in NAD; vital signs documented above Gait: Not observed HENT: WNL, normocephalic Pulmonary: normal non-labored breathing  Cardiac: regular HR Abdomen: soft, NT, no masses Skin: without rashes Extremities: Erythema surrounding exit site of TDC and right groin; sanguinous and purulent drainage with manipulation of catheter Musculoskeletal: no muscle wasting or atrophy  Neurologic: A&O X  3;  No focal weakness or paresthesias are detected Psychiatric:  The pt has Normal affect.     ASSESSMENT/PLAN:: 56 y.o. male here for evaluation of right femoral TDC drainage  -Right femoral TDC appears to be infected given the purulent drainage coming from the exit site with manipulation of the catheter -Dr. Donzetta Matters and myself discussed the risks of systemic infection if [facility] is not  removed.  Patient is aware he is on his last access and adamantly is against lumbar dialysis access.  He is also aware of the risks related to an infected [facility] and states that he does not care if he develops sepsis, he prefers to continue HD via his femoral catheter.  The catheter was no longer sutured to his right thigh.  Patient also asked me to fix the catheter to his thigh by replacing the sutures and this was done with 3-0 nylon x2.  I will also contact his outpatient dialysis center at the [place] location and ask them to administer a gram of vancomycin each treatment for the next 2 weeks.  We again gave our recommendation to remove catheter however patient adamantly was against this.   Dagoberto Ligas, PA-C [facility] [phone]  Clinic MD:   Dr. Donzetta Matters was involved in the evaluation and management of this patient today

## 2021-03-07 ENCOUNTER — Emergency Department (HOSPITAL_COMMUNITY): Payer: Medicare Other

## 2021-03-07 ENCOUNTER — Inpatient Hospital Stay (HOSPITAL_COMMUNITY)
Admission: EM | Admit: 2021-03-07 | Discharge: 2021-03-17 | DRG: 871 | Disposition: A | Discharge status: Home Health | Payer: Medicare Other | Attending: Internal Medicine | Admitting: Internal Medicine

## 2021-03-07 DIAGNOSIS — G4700 Insomnia, unspecified: Secondary | ICD-10-CM | POA: Diagnosis present

## 2021-03-07 DIAGNOSIS — I120 Hypertensive chronic kidney disease with stage 5 chronic kidney disease or end stage renal disease: Secondary | ICD-10-CM | POA: Diagnosis present

## 2021-03-07 DIAGNOSIS — D631 Anemia in chronic kidney disease: Secondary | ICD-10-CM | POA: Diagnosis present

## 2021-03-07 DIAGNOSIS — I953 Hypotension of hemodialysis: Secondary | ICD-10-CM | POA: Diagnosis present

## 2021-03-07 DIAGNOSIS — R7881 Bacteremia: Secondary | ICD-10-CM

## 2021-03-07 DIAGNOSIS — E871 Hypo-osmolality and hyponatremia: Secondary | ICD-10-CM | POA: Diagnosis present

## 2021-03-07 DIAGNOSIS — Z932 Ileostomy status: Secondary | ICD-10-CM

## 2021-03-07 DIAGNOSIS — E669 Obesity, unspecified: Secondary | ICD-10-CM | POA: Diagnosis present

## 2021-03-07 DIAGNOSIS — E039 Hypothyroidism, unspecified: Secondary | ICD-10-CM | POA: Diagnosis present

## 2021-03-07 DIAGNOSIS — T8189XA Other complications of procedures, not elsewhere classified, initial encounter: Secondary | ICD-10-CM | POA: Diagnosis present

## 2021-03-07 DIAGNOSIS — N200 Calculus of kidney: Secondary | ICD-10-CM | POA: Diagnosis present

## 2021-03-07 DIAGNOSIS — Z888 Allergy status to other drugs, medicaments and biological substances status: Secondary | ICD-10-CM

## 2021-03-07 DIAGNOSIS — N186 End stage renal disease: Secondary | ICD-10-CM | POA: Diagnosis present

## 2021-03-07 DIAGNOSIS — R519 Headache, unspecified: Secondary | ICD-10-CM | POA: Diagnosis not present

## 2021-03-07 DIAGNOSIS — N2581 Secondary hyperparathyroidism of renal origin: Secondary | ICD-10-CM | POA: Diagnosis present

## 2021-03-07 DIAGNOSIS — Z992 Dependence on renal dialysis: Secondary | ICD-10-CM | POA: Diagnosis not present

## 2021-03-07 DIAGNOSIS — G8929 Other chronic pain: Secondary | ICD-10-CM | POA: Diagnosis not present

## 2021-03-07 DIAGNOSIS — Z20822 Contact with and (suspected) exposure to covid-19: Secondary | ICD-10-CM | POA: Diagnosis present

## 2021-03-07 DIAGNOSIS — Z818 Family history of other mental and behavioral disorders: Secondary | ICD-10-CM

## 2021-03-07 DIAGNOSIS — D696 Thrombocytopenia, unspecified: Secondary | ICD-10-CM | POA: Diagnosis present

## 2021-03-07 DIAGNOSIS — E875 Hyperkalemia: Secondary | ICD-10-CM | POA: Diagnosis present

## 2021-03-07 DIAGNOSIS — I350 Nonrheumatic aortic (valve) stenosis: Secondary | ICD-10-CM | POA: Diagnosis present

## 2021-03-07 DIAGNOSIS — A419 Sepsis, unspecified organism: Secondary | ICD-10-CM | POA: Diagnosis present

## 2021-03-07 DIAGNOSIS — Z7902 Long term (current) use of antithrombotics/antiplatelets: Secondary | ICD-10-CM

## 2021-03-07 DIAGNOSIS — Z87442 Personal history of urinary calculi: Secondary | ICD-10-CM

## 2021-03-07 DIAGNOSIS — Z8249 Family history of ischemic heart disease and other diseases of the circulatory system: Secondary | ICD-10-CM

## 2021-03-07 DIAGNOSIS — E878 Other disorders of electrolyte and fluid balance, not elsewhere classified: Secondary | ICD-10-CM | POA: Diagnosis present

## 2021-03-07 DIAGNOSIS — I9589 Other hypotension: Secondary | ICD-10-CM | POA: Diagnosis not present

## 2021-03-07 DIAGNOSIS — Z79890 Hormone replacement therapy (postmenopausal): Secondary | ICD-10-CM

## 2021-03-07 DIAGNOSIS — M25569 Pain in unspecified knee: Secondary | ICD-10-CM

## 2021-03-07 DIAGNOSIS — G894 Chronic pain syndrome: Secondary | ICD-10-CM | POA: Diagnosis present

## 2021-03-07 DIAGNOSIS — D638 Anemia in other chronic diseases classified elsewhere: Secondary | ICD-10-CM | POA: Diagnosis not present

## 2021-03-07 DIAGNOSIS — N319 Neuromuscular dysfunction of bladder, unspecified: Secondary | ICD-10-CM | POA: Diagnosis present

## 2021-03-07 DIAGNOSIS — R109 Unspecified abdominal pain: Secondary | ICD-10-CM | POA: Diagnosis not present

## 2021-03-07 DIAGNOSIS — F419 Anxiety disorder, unspecified: Secondary | ICD-10-CM | POA: Diagnosis present

## 2021-03-07 DIAGNOSIS — R509 Fever, unspecified: Secondary | ICD-10-CM | POA: Diagnosis present

## 2021-03-07 DIAGNOSIS — A4151 Sepsis due to Escherichia coli [E. coli]: Secondary | ICD-10-CM | POA: Diagnosis present

## 2021-03-07 DIAGNOSIS — K219 Gastro-esophageal reflux disease without esophagitis: Secondary | ICD-10-CM | POA: Diagnosis present

## 2021-03-07 DIAGNOSIS — Z9049 Acquired absence of other specified parts of digestive tract: Secondary | ICD-10-CM

## 2021-03-07 DIAGNOSIS — R6520 Severe sepsis without septic shock: Secondary | ICD-10-CM | POA: Diagnosis present

## 2021-03-07 DIAGNOSIS — I959 Hypotension, unspecified: Secondary | ICD-10-CM | POA: Diagnosis not present

## 2021-03-07 DIAGNOSIS — M25562 Pain in left knee: Secondary | ICD-10-CM | POA: Diagnosis present

## 2021-03-07 DIAGNOSIS — B9620 Unspecified Escherichia coli [E. coli] as the cause of diseases classified elsewhere: Secondary | ICD-10-CM | POA: Diagnosis not present

## 2021-03-07 DIAGNOSIS — Z79899 Other long term (current) drug therapy: Secondary | ICD-10-CM

## 2021-03-07 DIAGNOSIS — Z885 Allergy status to narcotic agent status: Secondary | ICD-10-CM

## 2021-03-07 DIAGNOSIS — Z6837 Body mass index (BMI) 37.0-37.9, adult: Secondary | ICD-10-CM

## 2021-03-07 DIAGNOSIS — D72829 Elevated white blood cell count, unspecified: Secondary | ICD-10-CM | POA: Diagnosis not present

## 2021-03-07 DIAGNOSIS — Z833 Family history of diabetes mellitus: Secondary | ICD-10-CM

## 2021-03-07 DIAGNOSIS — Z89411 Acquired absence of right great toe: Secondary | ICD-10-CM

## 2021-03-07 DIAGNOSIS — Z882 Allergy status to sulfonamides status: Secondary | ICD-10-CM

## 2021-03-07 DIAGNOSIS — Z7982 Long term (current) use of aspirin: Secondary | ICD-10-CM

## 2021-03-07 DIAGNOSIS — M549 Dorsalgia, unspecified: Secondary | ICD-10-CM | POA: Diagnosis not present

## 2021-03-07 DIAGNOSIS — Q059 Spina bifida, unspecified: Secondary | ICD-10-CM

## 2021-03-07 LAB — URINALYSIS, MICROSCOPIC (REFLEX): RBC / HPF: >50 RBC/hpf (ref 0–5)

## 2021-03-07 LAB — RESP PANEL BY RT-PCR (FLU A&B, COVID) ARPGX2
Influenza A by PCR: NEGATIVE
Influenza B by PCR: NEGATIVE
SARS Coronavirus 2 by RT PCR: NEGATIVE

## 2021-03-07 LAB — COMPREHENSIVE METABOLIC PANEL
ALT: 11 U/L (ref 0–44)
AST: 16 U/L (ref 15–41)
Albumin: 3.5 g/dL (ref 3.5–5.0)
Alkaline Phosphatase: 149 U/L — ABNORMAL HIGH (ref 38–126)
Anion gap: 13 (ref 5–15)
BUN: 28 mg/dL — ABNORMAL HIGH (ref 6–20)
CO2: 26 mmol/L (ref 22–32)
Calcium: 8.4 mg/dL — ABNORMAL LOW (ref 8.9–10.3)
Chloride: 93 mmol/L — ABNORMAL LOW (ref 98–111)
Creatinine, Ser: 7.25 mg/dL — ABNORMAL HIGH (ref 0.61–1.24)
GFR, Estimated: 8 mL/min — ABNORMAL LOW (ref >60)
Glucose, Bld: 92 mg/dL (ref 70–99)
Potassium: 3.7 mmol/L (ref 3.5–5.1)
Sodium: 132 mmol/L — ABNORMAL LOW (ref 135–145)
Total Bilirubin: 0.5 mg/dL (ref 0.3–1.2)
Total Protein: 7.3 g/dL (ref 6.5–8.1)

## 2021-03-07 LAB — URINALYSIS, ROUTINE W REFLEX MICROSCOPIC

## 2021-03-07 LAB — PROTIME-INR
INR: 1.2 (ref 0.8–1.2)
Prothrombin Time: 14.8 seconds (ref 11.4–15.2)

## 2021-03-07 LAB — CBC WITH DIFFERENTIAL/PLATELET
Abs Immature Granulocytes: 0.02 10*3/uL (ref 0.00–0.07)
Basophils Absolute: 0 10*3/uL (ref 0.0–0.1)
Basophils Relative: 0 %
Eosinophils Absolute: 0 10*3/uL (ref 0.0–0.5)
Eosinophils Relative: 0 %
HCT: 39.8 % (ref 39.0–52.0)
Hemoglobin: 12.2 g/dL — ABNORMAL LOW (ref 13.0–17.0)
Immature Granulocytes: 1 %
Lymphocytes Relative: 1 %
Lymphs Abs: 0 10*3/uL — ABNORMAL LOW (ref 0.7–4.0)
MCH: 30.4 pg (ref 26.0–34.0)
MCHC: 30.7 g/dL (ref 30.0–36.0)
MCV: 99.3 fL (ref 80.0–100.0)
Monocytes Absolute: 0 10*3/uL — ABNORMAL LOW (ref 0.1–1.0)
Monocytes Relative: 0 %
Neutro Abs: 3.3 10*3/uL (ref 1.7–7.7)
Neutrophils Relative %: 98 %
Platelets: 108 10*3/uL — ABNORMAL LOW (ref 150–400)
RBC: 4.01 MIL/uL — ABNORMAL LOW (ref 4.22–5.81)
RDW: 16.6 % — ABNORMAL HIGH (ref 11.5–15.5)
WBC: 3.4 10*3/uL — ABNORMAL LOW (ref 4.0–10.5)
nRBC: 0 % (ref 0.0–0.2)

## 2021-03-07 LAB — LACTIC ACID, PLASMA
Lactic Acid, Venous: 2.4 mmol/L (ref 0.5–1.9)
Lactic Acid, Venous: 1.8 mmol/L (ref 0.5–1.9)

## 2021-03-07 MED ORDER — SODIUM CHLORIDE 0.9 % IV SOLN
2.0000 g | Freq: Once | INTRAVENOUS | Status: AC
  Administered 2021-03-07: 2 g via INTRAVENOUS
  Filled 2021-03-07: qty 2

## 2021-03-07 MED ORDER — CEFEPIME HCL 1 G IJ SOLR
1.0000 g | INTRAMUSCULAR | Status: DC
  Filled 2021-03-07: qty 1

## 2021-03-07 MED ORDER — LACTATED RINGERS IV BOLUS
500.0000 mL | Freq: Once | INTRAVENOUS | Status: AC
Start: 2021-03-07 — End: 2021-03-07
  Administered 2021-03-07: 500 mL via INTRAVENOUS

## 2021-03-07 MED ORDER — LACTATED RINGERS IV SOLN
INTRAVENOUS | Status: DC
  Administered 2021-03-07: 150 mL via INTRAVENOUS

## 2021-03-07 MED ORDER — LORAZEPAM 1 MG PO TABS
1.0000 mg | ORAL_TABLET | Freq: Two times a day (BID) | ORAL | Status: DC | PRN
  Administered 2021-03-09 – 2021-03-16 (×5): 1 mg via ORAL
  Filled 2021-03-07 (×7): qty 1

## 2021-03-07 MED ORDER — DICLOFENAC SODIUM 1 % EX GEL
2.0000 g | Freq: Every day | CUTANEOUS | Status: DC | PRN
  Administered 2021-03-13 – 2021-03-15 (×2): 2 g via TOPICAL
  Filled 2021-03-07: qty 100

## 2021-03-07 MED ORDER — LACTATED RINGERS IV BOLUS
250.0000 mL | Freq: Once | INTRAVENOUS | Status: AC
  Administered 2021-03-07: 250 mL via INTRAVENOUS

## 2021-03-07 MED ORDER — ACETAMINOPHEN 325 MG PO TABS
650.0000 mg | ORAL_TABLET | Freq: Once | ORAL | Status: AC
Start: 2021-03-07 — End: 2021-03-07
  Administered 2021-03-07: 650 mg via ORAL
  Filled 2021-03-07: qty 2

## 2021-03-07 MED ORDER — VANCOMYCIN HCL 10 G IV SOLR
2500.0000 mg | Freq: Once | INTRAVENOUS | Status: AC
  Administered 2021-03-07: 2500 mg via INTRAVENOUS
  Filled 2021-03-07: qty 2500

## 2021-03-07 MED ORDER — IOHEXOL 300 MG/ML  SOLN
100.0000 mL | Freq: Once | INTRAMUSCULAR | Status: AC | PRN
  Administered 2021-03-07: 100 mL via INTRAVENOUS

## 2021-03-07 MED ORDER — TRAZODONE HCL 100 MG PO TABS
100.0000 mg | ORAL_TABLET | Freq: Every day | ORAL | Status: DC
  Administered 2021-03-07 – 2021-03-16 (×10): 100 mg via ORAL
  Filled 2021-03-07: qty 2
  Filled 2021-03-07 (×4): qty 1
  Filled 2021-03-07: qty 2
  Filled 2021-03-07 (×4): qty 1

## 2021-03-07 MED ORDER — ROSUVASTATIN CALCIUM 5 MG PO TABS
10.0000 mg | ORAL_TABLET | Freq: Every day | ORAL | Status: DC
Start: 2021-03-08 — End: 2021-03-17
  Administered 2021-03-08 – 2021-03-17 (×10): 10 mg via ORAL
  Filled 2021-03-07 (×10): qty 2

## 2021-03-07 MED ORDER — HYDROMORPHONE HCL 1 MG/ML IJ SOLN
1.0000 mg | Freq: Once | INTRAMUSCULAR | Status: AC
  Administered 2021-03-07: 1 mg via INTRAVENOUS
  Filled 2021-03-07 (×2): qty 1

## 2021-03-07 MED ORDER — SODIUM CHLORIDE 0.9 % IV SOLN: 1.0000 g | Freq: Once | INTRAVENOUS | Status: DC

## 2021-03-07 MED ORDER — HYDROMORPHONE HCL 1 MG/ML IJ SOLN
0.5000 mg | INTRAMUSCULAR | Status: DC | PRN
  Administered 2021-03-07: 0.5 mg via INTRAVENOUS
  Filled 2021-03-07 (×2): qty 1

## 2021-03-07 MED ORDER — CLOPIDOGREL BISULFATE 75 MG PO TABS
75.0000 mg | ORAL_TABLET | Freq: Every day | ORAL | Status: DC
  Administered 2021-03-08 – 2021-03-16 (×9): 75 mg via ORAL
  Filled 2021-03-07 (×9): qty 1

## 2021-03-07 MED ORDER — FAMOTIDINE 20 MG PO TABS
40.0000 mg | ORAL_TABLET | Freq: Every day | ORAL | Status: DC
  Administered 2021-03-08 – 2021-03-17 (×10): 40 mg via ORAL
  Filled 2021-03-07 (×10): qty 2

## 2021-03-07 MED ORDER — ONDANSETRON HCL 4 MG/2ML IJ SOLN
4.0000 mg | Freq: Once | INTRAMUSCULAR | Status: AC
  Administered 2021-03-07: 4 mg via INTRAVENOUS
  Filled 2021-03-07: qty 2

## 2021-03-07 MED ORDER — ESCITALOPRAM OXALATE 10 MG PO TABS
10.0000 mg | ORAL_TABLET | Freq: Every day | ORAL | Status: DC
  Administered 2021-03-08 – 2021-03-17 (×10): 10 mg via ORAL
  Filled 2021-03-07 (×10): qty 1

## 2021-03-07 MED ORDER — ACETAMINOPHEN 325 MG PO TABS
650.0000 mg | ORAL_TABLET | Freq: Four times a day (QID) | ORAL | Status: DC | PRN
  Administered 2021-03-07: 650 mg via ORAL
  Filled 2021-03-07: qty 2

## 2021-03-07 MED ORDER — ACETAMINOPHEN 325 MG PO TABS: 650.0000 mg | ORAL_TABLET | Freq: Four times a day (QID) | ORAL | Status: DC

## 2021-03-07 MED ORDER — HEPARIN SODIUM (PORCINE) 5000 UNIT/ML IJ SOLN
5000.0000 [IU] | Freq: Three times a day (TID) | INTRAMUSCULAR | Status: DC
  Administered 2021-03-07 – 2021-03-13 (×9): 5000 [IU] via SUBCUTANEOUS
  Filled 2021-03-07 (×13): qty 1

## 2021-03-07 MED ORDER — LACTATED RINGERS IV BOLUS
1000.0000 mL | Freq: Once | INTRAVENOUS | Status: AC
  Administered 2021-03-07: 1000 mL via INTRAVENOUS

## 2021-03-07 MED ORDER — ACETAMINOPHEN 500 MG PO TABS
1000.0000 mg | ORAL_TABLET | Freq: Three times a day (TID) | ORAL | Status: DC
  Administered 2021-03-07 – 2021-03-16 (×22): 1000 mg via ORAL
  Filled 2021-03-07 (×25): qty 2

## 2021-03-07 MED ORDER — LEVOTHYROXINE SODIUM 25 MCG PO TABS
137.0000 ug | ORAL_TABLET | Freq: Every day | ORAL | Status: DC
  Administered 2021-03-08 – 2021-03-17 (×8): 137 ug via ORAL
  Filled 2021-03-07 (×10): qty 1

## 2021-03-07 NOTE — ED Notes (Signed)
Will hold the dilaudid for another stable BP reading  ?

## 2021-03-07 NOTE — ED Notes (Addendum)
MD Zinoviev notified of continuous hypotension - pt systolic in the 88A and 48E with MAPs in the 40s - pt remains AO - MD to come and lay eyes on pt ?

## 2021-03-07 NOTE — ED Notes (Addendum)
Per MD Zinoviev can pause maintenance fluids and give LR bolus then restart maintenance fluids  ? ?

## 2021-03-07 NOTE — ED Provider Notes (Signed)
[facility] EMERGENCY DEPARTMENT Provider Note   CSN: [id number] Arrival date & time: 03/07/21  1056     History  Chief Complaint  Patient presents with   Hypotension    [name] is a 56 y.o. male who presents the emergency department from dialysis for hypotension and nausea.  Patient states that last night he began having right flank pain and nausea.  When he was at dialysis he had 3 kg removed and completed his treatment.  He began having hypotension with a blood pressure of 90/60 with EMS.  He states that he has had similar symptoms in the past when he has had kidney stones that progressed to infection.  He states normally when this happens he starts with flank pain, that progresses to nausea and a fever. Has hx of bilateral nephrostomy that often drain and he reports "haven't healed well".   HPI     Home Medications Prior to Admission medications   Medication Sig Start Date End Date Taking? Authorizing Provider  acetaminophen (TYLENOL) 500 MG tablet Take 1,000 mg by mouth every 6 (six) hours as needed for mild pain or headache.   Yes [provider]  aspirin EC 325 MG tablet Take 325 mg by mouth daily.   Yes [provider]  calcium acetate (PHOSLO) 667 MG capsule Take 3-4 capsules (2,001-2,668 mg total) by mouth See admin instructions. Take 2708 mg by mouth 3 times daily with meals, take 2001 mg by mouth with snacks Patient taking differently: Take 2,668 mg by mouth 3 (three) times daily with meals. 01/14/18  Yes Hennie Duos, MD  cinacalcet [facility]) 30 MG tablet Take 1 tablet (30 mg total) by mouth every Monday, Wednesday, and Friday with hemodialysis. 02/18/20  Yes Lacinda Axon, MD  clopidogrel (PLAVIX) 75 MG tablet Take 75 mg by mouth daily.   Yes [provider]  diclofenac Sodium (VOLTAREN) 1 % GEL Apply 2 g topically daily as needed (For leg pain).   Yes [provider]  doxercalciferol (HECTOROL) 4  MCG/2ML injection Inject 2 mLs (4 mcg total) into the vein every Monday, Wednesday, and Friday with hemodialysis. 02/18/20  Yes Lacinda Axon, MD  escitalopram (LEXAPRO) 5 MG tablet Take 5 mg by mouth daily.   Yes [provider]  famotidine (PEPCID) 40 MG tablet Take 40 mg by mouth daily. 09/01/10  Yes [provider]  fluticasone (FLONASE) 50 MCG/ACT nasal spray Place 2 sprays into both nostrils as needed for allergies or rhinitis (congestion). Patient taking differently: Place 2 sprays into both nostrils daily as needed for allergies or rhinitis (congestion). 01/14/18  Yes Hennie Duos, MD  furosemide (LASIX) 80 MG tablet Take 80-160 mg by mouth See admin instructions. Take 80 mg daily and may take an extra 80 mg if needed for fluid 04/20/20  Yes [provider]  levothyroxine (SYNTHROID) 137 MCG tablet Take 137 mcg by mouth daily before breakfast.   Yes [provider]  LORazepam (ATIVAN) 1 MG tablet Take 1 mg by mouth 2 (two) times daily as needed for anxiety.   Yes [provider]  Melatonin 10 MG TABS Take 10 mg by mouth at bedtime as needed (sleep).   Yes [provider]  Nutritional Supplements (NEPRO) LIQD Take 237 mLs by mouth daily. Mixed Berry   Yes [provider]  oxyCODONE-acetaminophen (PERCOCET) 10-325 MG tablet Take 1 tablet by mouth every 6 (six) hours as needed for pain. 02/04/21  Yes [provider]  polyethylene glycol (MIRALAX / GLYCOLAX) 17 g packet Take 17 g by mouth daily. Patient taking differently: Take 17 g by mouth daily as needed for mild constipation. 02/18/20  Yes Lacinda Axon, MD  promethazine (PHENERGAN) 25 MG tablet Take 1 tablet (25 mg total) by mouth 2 (two) times daily as needed for nausea or vomiting. 01/14/18  Yes Hennie Duos, MD  rosuvastatin (CRESTOR) 10 MG tablet Take 1 tablet (10 mg total) by mouth daily. 10/18/19 03/07/21 Yes Brimage, Ronnette Juniper, DO  senna (SENOKOT) 8.6 MG  TABS tablet Take 2 tablets (17.2 mg total) by mouth daily as needed for mild constipation. 02/18/20  Yes Lacinda Axon, MD  sodium polystyrene (KAYEXALATE) powder Take 15 g by mouth See admin instructions. Take 15 g (mixed in water) by mouth on Saturdays and Sundays Patient taking differently: Take 15 g by mouth See admin instructions. Take 15 g (mixed in water) by mouth on Saturdays and Sundays & Tuesday, Thursday. 01/14/18  Yes Hennie Duos, MD  traZODone (DESYREL) 100 MG tablet Take 1 tablet (100 mg total) by mouth at bedtime. 01/14/18  Yes Hennie Duos, MD  zolpidem (AMBIEN) 10 MG tablet Take 1 tablet (10 mg total) by mouth at bedtime. 01/14/18  Yes Hennie Duos, MD      Allergies    Hydrocodone, Furadantin [nitrofurantoin], Mandelamine [methenamine], Noroxin [norfloxacin], Colace [docusate], Elemental sulfur, and Sulfa antibiotics    Review of Systems   Review of Systems  Constitutional:  Positive for chills and fever.  Respiratory:  Negative for shortness of breath.   Cardiovascular:  Negative for chest pain.  Gastrointestinal:  Positive for abdominal pain and nausea. Negative for vomiting.  Musculoskeletal:  Positive for back pain.  All other systems reviewed and are negative.  Physical Exam Updated Vital Signs BP (!) 90/51    Pulse 78    Temp 100.1 F (37.8 C) (Oral)    Resp 18    SpO2 94%  Physical Exam Vitals and nursing note reviewed.  Constitutional:      Appearance: Normal appearance. He is ill-appearing.  HENT:     Head: Normocephalic and atraumatic.  Eyes:     Conjunctiva/sclera: Conjunctivae normal.  Cardiovascular:     Rate and Rhythm: Normal rate and regular rhythm.  Pulmonary:     Effort: Pulmonary effort is normal. No respiratory distress.     Breath sounds: Normal breath sounds.  Abdominal:     General: The ostomy site is clean. There is no distension.     Palpations: Abdomen is soft.     Tenderness: There is generalized abdominal  tenderness. There is right CVA tenderness, left CVA tenderness and guarding. There is no rebound.  Skin:    General: Skin is warm and dry.     Coloration: Skin is pale.  Neurological:     General: No focal deficit present.     Mental Status: He is alert.    ED Results / Procedures / Treatments   Labs (all labs ordered are listed, but only abnormal results are displayed) Labs Reviewed  COMPREHENSIVE METABOLIC PANEL - Abnormal; Notable for the following components:      Result Value   Sodium 132 (*)    Chloride 93 (*)    BUN 28 (*)    Creatinine, Ser 7.25 (*)    Calcium 8.4 (*)    Alkaline Phosphatase 149 (*)    GFR, Estimated 8 (*)    All other components within  normal limits  LACTIC ACID, PLASMA - Abnormal; Notable for the following components:   Lactic Acid, Venous 2.4 (*)    All other components within normal limits  CBC WITH DIFFERENTIAL/PLATELET - Abnormal; Notable for the following components:   WBC 3.4 (*)    RBC 4.01 (*)    Hemoglobin 12.2 (*)    RDW 16.6 (*)    Platelets 108 (*)    Lymphs Abs 0.0 (*)    Monocytes Absolute 0.0 (*)    All other components within normal limits  URINALYSIS, ROUTINE W REFLEX MICROSCOPIC - Abnormal; Notable for the following components:   Color, Urine BROWN (*)    APPearance TURBID (*)    Glucose, UA   (*)    Value: TEST NOT REPORTED DUE TO COLOR INTERFERENCE OF URINE PIGMENT   Hgb urine dipstick   (*)    Value: TEST NOT REPORTED DUE TO COLOR INTERFERENCE OF URINE PIGMENT   Bilirubin Urine   (*)    Value: TEST NOT REPORTED DUE TO COLOR INTERFERENCE OF URINE PIGMENT   Ketones, ur   (*)    Value: TEST NOT REPORTED DUE TO COLOR INTERFERENCE OF URINE PIGMENT   Protein, ur   (*)    Value: TEST NOT REPORTED DUE TO COLOR INTERFERENCE OF URINE PIGMENT   Nitrite   (*)    Value: TEST NOT REPORTED DUE TO COLOR INTERFERENCE OF URINE PIGMENT   Leukocytes,Ua   (*)    Value: TEST NOT REPORTED DUE TO COLOR INTERFERENCE OF URINE PIGMENT   All  other components within normal limits  URINALYSIS, MICROSCOPIC (REFLEX) - Abnormal; Notable for the following components:   Bacteria, UA FIELD OBSCURED BY RBC'S (*)    All other components within normal limits  RESP PANEL BY RT-PCR (FLU A&B, COVID) ARPGX2  CULTURE, BLOOD (ROUTINE X 2)  CULTURE, BLOOD (ROUTINE X 2)  LACTIC ACID, PLASMA  PROTIME-INR  PATHOLOGIST SMEAR REVIEW  HIV ANTIBODY (ROUTINE TESTING W REFLEX)    EKG None  Radiology DG Chest 2 View  Result Date: 03/07/2021 CLINICAL DATA:  56 year old male with suspected sepsis. EXAM: CHEST - 2 VIEW COMPARISON:  Chest x-ray 01/27/2020. FINDINGS: Lung volumes are low. No consolidative airspace disease. No pleural effusions. No pneumothorax. No pulmonary nodule or mass noted. Pulmonary vasculature and the cardiomediastinal silhouette are within normal limits. IMPRESSION: 1. Low lung volumes without radiographic evidence of acute cardiopulmonary disease. Electronically Signed   By: Vinnie Langton M.D.   On: 03/07/2021 12:00   CT ABDOMEN PELVIS W CONTRAST  Result Date: 03/07/2021 CLINICAL DATA:  Nausea and right flank pain since last night. EXAM: CT ABDOMEN AND PELVIS WITH CONTRAST TECHNIQUE: Multidetector CT imaging of the abdomen and pelvis was performed using the standard protocol following bolus administration of intravenous contrast. RADIATION DOSE REDUCTION: This exam was performed according to the departmental dose-optimization program which includes automated exposure control, adjustment of the mA and/or kV according to patient size and/or use of iterative reconstruction technique. CONTRAST:  143mL OMNIPAQUE IOHEXOL 300 MG/ML  SOLN COMPARISON:  January 28, 2020 FINDINGS: Lower chest: No acute abnormality. Coronary artery calcifications. Calcifications aortic valve. Mitral annular calcifications. Hepatobiliary: No suspicious hepatic lesion. Gallbladder is distended without wall thickening or pericholecystic fluid. No biliary ductal  dilation. Pancreas: No pancreatic ductal dilation or evidence of acute inflammation. Spleen: Mild splenomegaly unchanged from prior. Adrenals/Urinary Tract: Bilateral adrenal glands appear normal. Nonobstructive bilateral renal stones. Atrophy of the bilateral kidneys. Similar appearance of the tract extending from  the left kidney to the skin surface reflecting a prior posterior percutaneous nephrostomy tube. Urinary bladder is decompressed. Stomach/Bowel: No enteric contrast was administered. Stomach is unremarkable for degree of distension. No pathologic dilation of small or large bowel. Moderate volume of formed stool in the colon. Right lower quadrant ileostomy with an area of soft tissue along the posterior aspect of the stoma anterior to the ascending colon on image 65/3 which is favored to reflect decompressed loops of fluid-filled bowel but is poorly evaluated given lack of oral contrast material. Vascular/Lymphatic: Normal caliber abdominal aorta. Aortic atherosclerosis. Left common femoral vein stent. Right femoral approach central venous catheter with tip in the IVC near the level of the hepatic vein inflow. Prominent retroperitoneal lymph nodes measuring up to 1 cm in short axis on image 54/3 cough similar prior. Reproductive: Prostate is unremarkable. Other: Right lower quadrant ileostomy. Trace abdominopelvic fluid and mesenteric stranding. Musculoskeletal: Osseous changes of renal osteodystrophy. Remote inferior pubic rami fractures. IMPRESSION: 1. Right lower quadrant ileostomy with an area of soft tissue along the posterior aspect of the stoma and anterior to the ascending colon which is favored to reflect decompressed loops of fluid-filled bowel but is poorly evaluated given lack of oral contrast material. No evidence of bowel obstruction or acute bowel inflammation. 2. Moderate volume of formed stool in the colon suggestive of constipation. 3. Nonobstructive bilateral renal stones. 4. Mild  splenomegaly. 5.  Aortic Atherosclerosis (ICD10-I70.0). Electronically Signed   By: Dahlia Bailiff M.D.   On: 03/07/2021 13:04    Procedures .Critical Care Performed by: Kateri Plummer, PA-C Authorized by: Kateri Plummer, PA-C   Critical care provider statement:    Critical care time (minutes):  30   Critical care was necessary to treat or prevent imminent or life-threatening deterioration of the following conditions:  Sepsis   Critical care was time spent personally by me on the following activities:  Development of treatment plan with patient or surrogate, discussions with consultants, evaluation of patient's response to treatment, examination of patient, ordering and review of laboratory studies, ordering and review of radiographic studies, ordering and performing treatments and interventions, pulse oximetry, re-evaluation of patient's condition and review of old charts    Medications Ordered in ED Medications  lactated ringers infusion (150 mLs Intravenous New Bag/Given 03/07/21 1308)  vancomycin (VANCOCIN) 2,500 mg in sodium chloride 0.9 % 500 mL IVPB (2,500 mg Intravenous New Bag/Given 03/07/21 1428)  heparin injection 5,000 Units (has no administration in time range)  lactated ringers bolus 500 mL (0 mLs Intravenous Stopped 03/07/21 1422)  acetaminophen (TYLENOL) tablet 650 mg (650 mg Oral Given 03/07/21 1201)  ceFEPIme (MAXIPIME) 2 g in sodium chloride 0.9 % 100 mL IVPB (0 g Intravenous Stopped 03/07/21 1402)  HYDROmorphone (DILAUDID) injection 1 mg (1 mg Intravenous Given 03/07/21 1357)  iohexol (OMNIPAQUE) 300 MG/ML solution 100 mL (100 mLs Intravenous Contrast Given 03/07/21 1241)  lactated ringers bolus 1,000 mL (1,000 mLs Intravenous Bolus 03/07/21 1315)    ED Course/ Medical Decision Making/ A&P                           Medical Decision Making Amount and/or Complexity of Data Reviewed Labs: ordered. Radiology: ordered. ECG/medicine tests: ordered.  Risk OTC  drugs. Prescription drug management. Decision regarding hospitalization.   This patient presents to the ED for concern of nausea and hypotension, this involves an extensive number of treatment options, and is a complaint that carries  with it a high risk of complications and morbidity. The emergent differential diagnosis prior to evaluation includes, but is not limited to, sepsis, urinary tract infection, pneumonia, diverticulitis, intestinal perforation, pyelonephritis, septic nephrolithiasis, gastroenteritis, pancreatitis, hepatitis.  This is not an exhaustive differential.   Past Medical History / Co-morbidities / Social History: End-stage renal disease on dialysis  Physical Exam: Physical exam performed. The pertinent findings include: Patient is chronically ill-appearing, febrile to 102.67F, hypotensive to 94/47.  Patient has generalized abdominal tenderness to palpation with guarding.  Left previous nephrostomy site with bloody drainage and minimal skin irritation, no purulence.  Lab Tests: I ordered, and personally interpreted labs.  The pertinent results include: No leukocytosis, hemoglobin improved compared to prior, mild hyponatremia 132, lactic acid 2.4. Urinalysis from suprapubic catheter pending.    Imaging Studies: I ordered imaging studies including chest x-ray and CT abdomen pelvis. I independently visualized and interpreted imaging which showed no acute cardiopulmonary or intra-abdominal pathology. I agree with the radiologist interpretation.   Cardiac Monitoring:  The patient was maintained on a cardiac monitor.  My attending physician Dr. Regenia Skeeter viewed and interpreted the cardiac monitored which showed an underlying rhythm of: normal sinus rhythm. I agree with this interpretation.    Medications: I ordered medication including IV fluids, analgesics, and empiric antibiotics per pharmacy consult for fever and likely sepsis. I have reviewed the patients home medicines and  have made adjustments as needed.  Consultations Obtained: I requested consultation with the hospitalist Dr. [name],  and discussed lab and imaging findings as well as pertinent plan - they recommend: admission   Disposition: After consideration of the diagnostic results and the patients response to treatment, I feel that patient is requiring admission and inpatient treatment.  Patient's blood pressure has slightly improved with IV fluid resuscitation, and I believe he is stable for the inpatient floor. The patient appears reasonably stabilized for admission considering the current resources, flow, and capabilities available in the ED at this time, and I doubt any other [facility] requiring further screening and/or treatment in the ED prior to admission.  I discussed this case with my attending physician Dr. Regenia Skeeter who cosigned this note including patient's presenting symptoms, physical exam, and planned diagnostics and interventions. Attending physician stated agreement with plan or made changes to plan which were implemented.   Final Clinical Impression(s) / ED Diagnoses Final diagnoses:  Sepsis associated hypotension ([place])    Rx / DC Orders ED Discharge Orders     None      Portions of this report may have been transcribed using voice recognition software. Every effort was made to ensure accuracy; however, inadvertent computerized transcription errors may be present.    Estill Cotta 03/07/21 1614    Sherwood Gambler, MD 03/09/21 [phone]

## 2021-03-07 NOTE — ED Notes (Addendum)
While MD's in the rm assessing pt BP read 659 systolic on pt forearm (pt states that he was talking and moving around at this time)- per MD's could go ahead and give dilaudid to pt, adjust pts fluids to 75 d/t pt sounding crackly on assessment - This RN recycled BP in same spot on forearm and continues to read low in the 70s/80s - recycled multiple times - this RN not comfortable giving dilaudid at this time - cuff moved to alternate location on right bicep - still reading low - this RN obtained manual BP - still reading low - MD's notified and called back to room - MD's pulled this RN to hallway and had discussion about since pt has had multiple vascular surgeries believe that BP is not reading accurate d/t surgeries and may actually be higher d/t pt remaining AO - suggested that this RN recline pt and recycle BPs - this RN reclined pt in bed to where pt was comfortable and cycled BP on forearm and bicep - still reading low. This RN even tried obtaining BP on pt leg, still reading low -This RN to call rapid response RN and have him assess pt for second opinion  ?

## 2021-03-07 NOTE — H&P (Signed)
Date: 03/07/2021               Patient Name:  [name] MRN: [id number]  DOB: [birth year] Age / Sex: 56 y.o., male   PCP: [name], MD         Medical Service: Internal Medicine Teaching Service         Attending Physician: Dr. [name], Elaina Pattee, MD    First Contact: Dr. Rosezetta Schlatter, MD Pager: [phone]  Second Contact: Dr. [name], MD Pager: [phone]       After Hours (After 5p/  First Contact Pager: [phone]  weekends / holidays): Second Contact Pager: [phone]   Chief Complaint: Nausea and bilateral flank pain  History of Present Illness: [name] is a 56 year old male with PMHx of ESRD on HD, GERD, HTN, recurrent kidney stones, aortic stenosis, spina bifida, anemia, and anxiety presenting from HD with hypotension, nausea, and bilateral flank pain since yesterday.  Last night at midnight, started having kidney pain, chills, fever. Went to HD, symptoms worsened, and his BP started dropping. He reports that he was nauseous, had pain, spiked temp, and dry heaved. As well, he had blurry vision when BP dropped.  Happened twice in 2022 for same reason. Was told it was from kidney stones.  In prior instances, typically after a week, goes back to normal.  He reports that his current symptoms feel similarly to previous episodes; the only change is right-sided flank pain this time (typically localizes to left).  Has two nephrostomy tube sites that won't heal completely. Still has stones in left kidney.  Does not make sufficient UOP to flush them out. Only produces a little urine, typically mixed with blood. Puts M9 solution to help with stench.  Denies headaches, SHOB, chest pain, weakness,   Does have constipation b/c of pain meds.  Born club-footed, R leg shorter than L. Hx of septic arthritis x2 (left knee, then in right knee)  B/l lower quadrant abd pain.  Has chronic back pain, for which he takes oxycodone 10-325mg  q6 prn at home.  Meds:  Reviewed with patient with correction-Lexapro now 10 mg Current Meds  Medication Sig   acetaminophen (TYLENOL) 500 MG tablet Take 1,000 mg by mouth every 6 (six) hours as needed for mild pain or headache.   aspirin EC 325 MG tablet Take 325 mg by mouth daily.   calcium acetate (PHOSLO) 667 MG capsule Take 3-4 capsules (2,001-2,668 mg total) by mouth See admin instructions. Take 2708 mg by mouth 3 times daily with meals, take 2001 mg by mouth with snacks (Patient taking differently: Take 2,668 mg by mouth 3 (three) times daily with meals.)   cinacalcet (SENSIPAR) 30 MG tablet Take 1 tablet (30 mg total) by mouth every Monday, Wednesday, and Friday with hemodialysis.   clopidogrel (PLAVIX) 75 MG tablet Take 75 mg by mouth daily.   diclofenac Sodium (VOLTAREN) 1 % GEL Apply 2 g topically daily as needed (For leg pain).   doxercalciferol (HECTOROL) 4 MCG/2ML injection Inject 2 mLs (4 mcg total) into the vein every Monday, Wednesday, and Friday with hemodialysis.   escitalopram (LEXAPRO) 5 MG tablet Take 5 mg by mouth daily.   famotidine (PEPCID) 40 MG tablet Take 40 mg by mouth daily.   fluticasone (FLONASE) 50 MCG/ACT nasal spray Place 2 sprays into both nostrils as needed for allergies or rhinitis (congestion). (Patient taking differently: Place 2 sprays into both nostrils daily as needed for allergies or rhinitis (congestion).)  furosemide (LASIX) 80 MG tablet Take 80-160 mg by mouth See admin instructions. Take 80 mg daily and may take an extra 80 mg if needed for fluid   levothyroxine (SYNTHROID) 137 MCG tablet Take 137 mcg by mouth daily before breakfast.   LORazepam (ATIVAN) 1 MG tablet Take 1 mg by mouth 2 (two) times daily as needed for anxiety.   Melatonin 10 MG TABS Take 10 mg by mouth at bedtime as needed (sleep).   Nutritional Supplements (NEPRO) LIQD Take 237 mLs by mouth daily. Mixed Berry   oxyCODONE-acetaminophen (PERCOCET) 10-325 MG tablet Take 1 tablet by mouth every 6 (six) hours  as needed for pain.   polyethylene glycol (MIRALAX / GLYCOLAX) 17 g packet Take 17 g by mouth daily. (Patient taking differently: Take 17 g by mouth daily as needed for mild constipation.)   promethazine (PHENERGAN) 25 MG tablet Take 1 tablet (25 mg total) by mouth 2 (two) times daily as needed for nausea or vomiting.   rosuvastatin (CRESTOR) 10 MG tablet Take 1 tablet (10 mg total) by mouth daily.   senna (SENOKOT) 8.6 MG TABS tablet Take 2 tablets (17.2 mg total) by mouth daily as needed for mild constipation.   sodium polystyrene (KAYEXALATE) powder Take 15 g by mouth See admin instructions. Take 15 g (mixed in water) by mouth on Saturdays and Sundays (Patient taking differently: Take 15 g by mouth See admin instructions. Take 15 g (mixed in water) by mouth on Saturdays and Sundays & Tuesday, Thursday.)   traZODone (DESYREL) 100 MG tablet Take 1 tablet (100 mg total) by mouth at bedtime.   zolpidem (AMBIEN) 10 MG tablet Take 1 tablet (10 mg total) by mouth at bedtime.     Allergies: Allergies as of 03/07/2021 - Review Complete 03/07/2021  Allergen Reaction Noted   Hydrocodone Other (See Comments) 11/05/2012   Furadantin [nitrofurantoin] Other (See Comments) 06/27/2012   Mandelamine [methenamine] Other (See Comments) 06/27/2012   Noroxin [norfloxacin] Other (See Comments) 06/27/2012   Colace [docusate] Diarrhea and Nausea And Vomiting 10/13/2019   Elemental sulfur Cough 02/08/2015   Sulfa antibiotics Other (See Comments) and Cough 06/24/2012   Past Medical History:  Diagnosis Date   Anemia    Anxiety    Clotted dialysis access ([place]) 01/06/2020   Constipation    End stage renal failure on dialysis [facility])    [place]; MWF, on HD since 1997, exhausted upper ext access, and possibly LE access; cath dependent in R groin as of 06/2018   GERD (gastroesophageal reflux disease)    Grand mal seizure ([place]) X 4   last 1998 (02/29/2016)   History of blood transfusion 2000s   "I was having blood  loss; never found out from where"   History of kidney stones    Hypertension    hx of - has not taken bp meds in over 2 years  since dialysis   Hypothyroidism    Insomnia    Migraine    "due to my BP in my late 1990s; none since" (02/29/2016)   Nonhealing surgical wound    of the left arteriovenous graft   Severe aortic stenosis    no problems with per pt. - nephrologist and Dr. Coletta Memos   Spina bifida [facility])     Family History: Mother, father, sister, both grandmas with hx of diabetes. No kidney disease in family.   Social History: No tobacco use, no alcohol use since 1990.  Patient ambulates with a cane and walker at baseline.  Able to perform ADLs and IADLs without assistance.  No recreational drug use currently, has used marijuana in the past.  Review of Systems: A complete ROS was negative except as per HPI.   Physical Exam: Blood pressure (!) 98/51, pulse 81, temperature (!) 100.5 F (38.1 C), temperature source Oral, resp. rate 18, SpO2 96 %.  Physical Exam: General: Obese, chronically ill-appearing male appearing in pain but NAD HENT: normocephalic, atraumatic, external nares and ears appear normal EYES: conjunctiva non-erythematous, no scleral icterus CV: regular rate, normal rhythm, IV/VI systolic murmur, but no rubs or gallops. Pulmonary: normal work of breathing on RA, lungs clear to auscultation bilaterally Abdominal: non-distended, soft, tender to palpation in bilateral lower quadrants, hypoactive BS in lower quadrants but normoactive in upper quadrants Skin: Warm and dry.  Appearance of venous insufficiency and warmth to touch in left lower extremity.  Nonhealing wounds from nephrostomy tubes bilaterally in flank areas with purulent and bloody drainage present on dressing. Capillary refill time >3 sec in bilateral hands and feet MSK: Right leg longer than left.  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 Neurological: Awake, alert and oriented x3.   Gait deferred Psych: normal affect and behavior   EKG: personally reviewed my interpretation is sinus rhythm with minimal ST elevation noted in anterior leads  CXR: personally reviewed my interpretation is decreased lung volume but clear without acute cardiopulmonary processes  Assessment & Plan by Problem: Principal Problem:   Bilateral flank pain Active Problems:   Nonhealing surgical wound   ESRD (end stage renal disease) on dialysis (HCC)   Fever   Bilateral kidney stones   Hypotension Bralon Antkowiak is a 56 year old male with a history of kidney stones, ESRD on HD, HTN, and nonhealing surgical wounds s/p removal of nephrostomy tubes who presented from HD with flank pain, fever, and hypotension worsening since midnight last night.  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  Had surgery, which caused kidney damage, required b/l nephrostomy tubes in 1980s, developed ESRD in later 1980s.  Since 1986, he reports a history of recurrent renal calculi of the left kidney.  He reports multiple hospitalizations of similar presentation with flank pain, fever, and hypotension as a complication of those kidney stones.  He denies history of renal calculi of the right kidney, but reports right-sided flank pain with this admission.  CT CAP with atrophy of bilateral kidneys, left kidney reflecting prior posterior nephrostomy tube, decompressed urinary bladder.  Nonobstructive bilateral renal stones.  Moderate stool burden.  UA obscured by RBCs. -Continue IVF with LR 150 mL/h - Pain control with Dilaudid 0.5 mg every 4 hours as needed and Tylenol 650 mg every 6 hours as needed -We will reevaluate patient's flank pain tomorrow morning and consider consulting urology if acute worsening.  If stable, will advise outpatient follow-up.  Fever Hypotension Sepsis Temperature 102.5 on  admission, decreased to 100.5 prior to administration of Tylenol.  Lactic acid 2.4 on admission, improved to 1.8.  During dialysis session, patient reports SBP in the 70s; BP 65-102/30-54 since admission; most recent 99/52.  No leukocytosis on CBC (3.4).  However, febrile, with pulse greater than than 90, and tachypnea, meeting SIRS criteria. -Tylenol 650 mg every 6 hours as needed - Blood cultures in process -Prophylactic cefepime initiated 1 g every 24 hours.  Hyponatremia Na 132.  Patient with fatigue, headache, and nausea (without vomiting). -Continue IVF -  Trend sodium level on daily BMP  ESRD on HD Patient dialyzed today, MWF schedule, but unable to complete session due to hypotension.  Patient with history of clotted dialysis access. - Avoid nephrotoxic agents - Continue home Plavix for history of clotted dialysis access.  Nonhealing surgical wound Patient with bilateral nephrostomy tubes removed, and nonhealing wounds since removal.  Purulence and blood present on drainage bilaterally from wound sites. - Consult to wound care for nonhealing nephrostomy tube wounds   Dispo: Admit patient to Inpatient with expected length of stay greater than 2 midnights.  Signed: Rosezetta Schlatter, MD 03/07/2021, 6:51 PM  Pager: [phone] After 5pm on weekdays and 1pm on weekends: On Call pager: [phone]

## 2021-03-07 NOTE — ED Notes (Addendum)
MD Zinoviev paged and notified of pts continued soft BPs ? ?

## 2021-03-07 NOTE — Consult Note (Addendum)
WOC Nurse Consult Note: ?Reason for Consult:Bilateral flank wounds with percutaneous nephrostomy tube sites that drain. Chronic, nonhealing. Patient reports that [facility] uses calcium alginate dressings. ?Wound type: Chronic, full thickness ?Pressure Injury POA: N/A ?Measurement: Horizontal wounds at bilateral flanks (percutaneous nephrostomy sites) with drainage. Left with indentation measuring 1cm x 3cm and right with indentation measuring 1cm x 3cm  ?Wound bed:Not able to see ?Drainage (amount, consistency, odor) moderate amount of dried serosanguinous on old dressings, some fresh serosanguinous exudate on sheets. ?Periwound: mild erythema/irritant contact dermatitis ? ?ICD-10 CM Codes for Irritant Dermatitis ? ?X10G2 - Due to friction or contact with other specified body fluids ? ?Dressing procedure/placement/frequency: I will provide Nursing with guidance for topica care using a soap and water cleanse followed by placement of a calcium alginate-an absorbent dressing. This is to be topped with folded dry gauze and an ABD pad secured with Medipore tape. ? ?Recommend consultation with urology or nephrology for further recommendations regarding management. ? ?[employer] nursing team will not follow, but will remain available to this patient, the nursing and medical teams.  Please re-consult if needed. ?Thanks, ?Maudie Flakes, MSN, RN, [place], [place], CWON-AP, [place]  ?Pager# [phone]  ? ? ? ?  ?

## 2021-03-07 NOTE — ED Notes (Addendum)
MD Zinoviev at bedside  ?

## 2021-03-07 NOTE — ED Notes (Addendum)
Pts pain still a 7 at this time - Per MD Zinoviev try to hold off on dilaudid due to pts BP - Automatic BP reading 88 systolic - will obtain a manual BP - MD aware ?

## 2021-03-07 NOTE — Hospital Course (Addendum)
Feeling bad today, in pain. 8/10 in severity, located in lower stomach. With pain medication, it gets down to 5/10, which is his baseline level of pain.  He thinks that things are starting to work.   Only concern is that both times he worked with PT, he started having headaches. He thinks it may be from midodrine because that has happened with it before. Discussed stopping midodrine and monitoring BP.    ______________________________________________  Last night at midnight, started having kidney pain, chills, fever. Went to HD, symptoms worsened, and his BP started dropping.  Still has stones in right kidney.  Has two nephrostomy tube sites that won't heal completely.  Starts to get nauseous, gets pain, spikes temp, dry heaves. Typically after a week, goes back to normal. Happened twice in 2022 for same reason. Was told it was from kidney stones.  Had blurry vision when BP dropped.  Does not make sufficient UOP to flush them out. Only produces a little urine, typically mixed with blood. Puts M9 solution to help with stench.  Denies headaches, SHOB, chest pain, weakness,   Does have constipation b/c of pain meds.  Born club-footed, R leg shorter than L. Hx of septic arthritis x2 (left knee, then in right knee)  B/l lower quadrant abd pain.  Has chronic back pain, for which he takes oxycodone 10-325mg  q6 prn at home.  No tobacco use, no alcohol use since 1990. No recreational drug use currently, has used marijuana in the past.  Mother, father, sister, both grandmas with hx of diabetes. No kidney disease in family.   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 Did surgery, which caused kidney damage, required b/l nephrostomy tubes in 1980s, developed ESRD in later 56s.

## 2021-03-07 NOTE — ED Triage Notes (Signed)
Pt arrived via [facility] from dialysis. Per EMS, pt came from dialysis after completing tx and having 3L removed. Pt c/o nausea and R flank pain since last night. BP 90/60 with EMS, HR 100. Pt is on O2 3L at baseline. 4mg  zofran admin by EMS. Pt arrived to ED alert and febrile.  ?

## 2021-03-07 NOTE — Progress Notes (Addendum)
Pharmacy Antibiotic Note ? ?[name] is a 56 y.o. male admitted on 03/07/2021 with sepsis.  Pharmacy has been consulted for vancomycin dosing. ? ?Patient with a history of anemia, anxiety, ESRD on HD, GERD, HTN, aortic stenosis, spina bifida. Patient presenting from HD with hypotension and nausea. ? ?WBC 3.4; LA 2.4; T 102.5 F; HR 79; RR 24 ? ?Plan: ?Vancomycin 2.5g once subsequent dosing per HD schedule ?Cefepime 1g q24h, 2g dose given in the ED ?Trend WBC, Fever, Renal function, & Clinical course ?F/u cultures, clinical course, WBC, fever ?De-escalate when able ?F/u Nephrology plan ? ?  ? ?Temp (24hrs), Avg:102.5 ?F (39.2 ?C), Min:102.5 ?F (39.2 ?C), Max:102.5 ?F (39.2 ?C) ? ?Recent Labs  ?Lab 03/07/21 ?1125  ?WBC 3.4*  ?CREATININE 7.25*  ?LATICACIDVEN 2.4*  ?  ?CrCl cannot be calculated (Unknown ideal weight.).   ? ?Allergies  ?Allergen Reactions  ? Hydrocodone Other (See Comments)  ?  Caused involuntary movement and twitching. CANNOT TAKE DUE TO MUSCLE SPASMS AND MUSCLE TREMORS ?Tolerates hydromorphone and oxycodone.  ? Furadantin [Nitrofurantoin] Other (See Comments)  ?  UNSPECIFIED REACTION   ? Mandelamine [Methenamine] Other (See Comments)  ?  UNSPECIFIED REACTION   ? Noroxin [Norfloxacin] Other (See Comments)  ?  UNSPECIFIED REACTION   ? Colace [Docusate] Diarrhea and Nausea And Vomiting  ? Elemental Sulfur Cough  ?  Childhood reaction - pt could not confirm that it was a cough  ? Sulfa Antibiotics Other (See Comments) and Cough  ?  Childhood reaction - pt could not confirm that it was a cough  ? ? ?Antimicrobials this admission: ?Cefepime 3/6 >>  ?Vancomycin 3/6 >> ? ?Microbiology results: ?Pending ? ?Thank you for allowing pharmacy to be a part of this patient?s care. ? ?Lorelei Pont, PharmD, BCPS ?03/07/2021 1:35 PM ?ED Clinical Pharmacist -  [phone] ?  ?

## 2021-03-07 NOTE — ED Notes (Addendum)
MD Zinoviev notified of current BP post LR bolus - pt remains AO - no further orders at this time - cont to monitor BP and neuro status throughout the night  ?

## 2021-03-08 DIAGNOSIS — Z992 Dependence on renal dialysis: Secondary | ICD-10-CM

## 2021-03-08 DIAGNOSIS — R109 Unspecified abdominal pain: Secondary | ICD-10-CM

## 2021-03-08 DIAGNOSIS — I959 Hypotension, unspecified: Secondary | ICD-10-CM

## 2021-03-08 DIAGNOSIS — N200 Calculus of kidney: Secondary | ICD-10-CM | POA: Diagnosis not present

## 2021-03-08 DIAGNOSIS — A419 Sepsis, unspecified organism: Secondary | ICD-10-CM | POA: Diagnosis not present

## 2021-03-08 DIAGNOSIS — N186 End stage renal disease: Secondary | ICD-10-CM

## 2021-03-08 LAB — CBC WITH DIFFERENTIAL/PLATELET
Abs Immature Granulocytes: 0.09 10*3/uL — ABNORMAL HIGH (ref 0.00–0.07)
Basophils Absolute: 0 10*3/uL (ref 0.0–0.1)
Basophils Relative: 0 %
Eosinophils Absolute: 0 10*3/uL (ref 0.0–0.5)
Eosinophils Relative: 0 %
HCT: 33.3 % — ABNORMAL LOW (ref 39.0–52.0)
Hemoglobin: 10.6 g/dL — ABNORMAL LOW (ref 13.0–17.0)
Immature Granulocytes: 2 %
Lymphocytes Relative: 2 %
Lymphs Abs: 0.1 10*3/uL — ABNORMAL LOW (ref 0.7–4.0)
MCH: 30.7 pg (ref 26.0–34.0)
MCHC: 31.8 g/dL (ref 30.0–36.0)
MCV: 96.5 fL (ref 80.0–100.0)
Monocytes Absolute: 0.2 10*3/uL (ref 0.1–1.0)
Monocytes Relative: 4 %
Neutro Abs: 4.1 10*3/uL (ref 1.7–7.7)
Neutrophils Relative %: 92 %
Platelets: 75 10*3/uL — ABNORMAL LOW (ref 150–400)
RBC: 3.45 MIL/uL — ABNORMAL LOW (ref 4.22–5.81)
RDW: 16.8 % — ABNORMAL HIGH (ref 11.5–15.5)
WBC: 4.4 10*3/uL (ref 4.0–10.5)
nRBC: 0 % (ref 0.0–0.2)

## 2021-03-08 LAB — PROTIME-INR
INR: 1.6 — ABNORMAL HIGH (ref 0.8–1.2)
Prothrombin Time: 18.8 seconds — ABNORMAL HIGH (ref 11.4–15.2)

## 2021-03-08 LAB — BLOOD CULTURE ID PANEL (REFLEXED) - BCID2

## 2021-03-08 LAB — COMPREHENSIVE METABOLIC PANEL
ALT: 17 U/L (ref 0–44)
AST: 26 U/L (ref 15–41)
Albumin: 2.7 g/dL — ABNORMAL LOW (ref 3.5–5.0)
Alkaline Phosphatase: 115 U/L (ref 38–126)
Anion gap: 14 (ref 5–15)
BUN: 45 mg/dL — ABNORMAL HIGH (ref 6–20)
CO2: 26 mmol/L (ref 22–32)
Calcium: 7.5 mg/dL — ABNORMAL LOW (ref 8.9–10.3)
Chloride: 92 mmol/L — ABNORMAL LOW (ref 98–111)
Creatinine, Ser: 8.6 mg/dL — ABNORMAL HIGH (ref 0.61–1.24)
GFR, Estimated: 7 mL/min — ABNORMAL LOW (ref >60)
Glucose, Bld: 79 mg/dL (ref 70–99)
Potassium: 5 mmol/L (ref 3.5–5.1)
Sodium: 132 mmol/L — ABNORMAL LOW (ref 135–145)
Total Bilirubin: 0.5 mg/dL (ref 0.3–1.2)
Total Protein: 6.1 g/dL — ABNORMAL LOW (ref 6.5–8.1)

## 2021-03-08 LAB — GLUCOSE, CAPILLARY: Glucose-Capillary: 73 mg/dL (ref 70–99)

## 2021-03-08 LAB — MRSA NEXT GEN BY PCR, NASAL: MRSA by PCR Next Gen: NOT DETECTED

## 2021-03-08 LAB — APTT: aPTT: 40 seconds — ABNORMAL HIGH (ref 24–36)

## 2021-03-08 LAB — HIV ANTIBODY (ROUTINE TESTING W REFLEX): HIV Screen 4th Generation wRfx: NONREACTIVE

## 2021-03-08 MED ORDER — ASPIRIN EC 81 MG PO TBEC
81.0000 mg | DELAYED_RELEASE_TABLET | Freq: Every day | ORAL | Status: DC
  Administered 2021-03-08 – 2021-03-17 (×10): 81 mg via ORAL
  Filled 2021-03-08 (×10): qty 1

## 2021-03-08 MED ORDER — MIDODRINE HCL 5 MG PO TABS
10.0000 mg | ORAL_TABLET | Freq: Three times a day (TID) | ORAL | Status: DC
  Administered 2021-03-08 – 2021-03-10 (×6): 10 mg via ORAL
  Filled 2021-03-08 (×6): qty 2

## 2021-03-08 MED ORDER — OXYCODONE-ACETAMINOPHEN 5-325 MG PO TABS
1.0000 | ORAL_TABLET | Freq: Four times a day (QID) | ORAL | Status: DC | PRN
  Administered 2021-03-08 – 2021-03-10 (×5): 1 via ORAL
  Filled 2021-03-08 (×8): qty 1

## 2021-03-08 MED ORDER — VANCOMYCIN VARIABLE DOSE PER UNSTABLE RENAL FUNCTION (PHARMACIST DOSING): Status: DC

## 2021-03-08 MED ORDER — SODIUM CHLORIDE 0.9 % IV SOLN: INTRAVENOUS | Status: DC | PRN

## 2021-03-08 MED ORDER — OXYCODONE HCL 5 MG PO TABS
5.0000 mg | ORAL_TABLET | Freq: Four times a day (QID) | ORAL | Status: DC | PRN
  Administered 2021-03-08 – 2021-03-10 (×5): 5 mg via ORAL
  Filled 2021-03-08 (×8): qty 1

## 2021-03-08 MED ORDER — SODIUM ZIRCONIUM CYCLOSILICATE 10 G PO PACK
10.0000 g | PACK | Freq: Once | ORAL | Status: AC
  Administered 2021-03-08: 10 g via ORAL
  Filled 2021-03-08: qty 1

## 2021-03-08 MED ORDER — SODIUM CHLORIDE 0.9 % IV SOLN
2.0000 g | INTRAVENOUS | Status: DC
  Administered 2021-03-08 – 2021-03-11 (×4): 2 g via INTRAVENOUS
  Filled 2021-03-08 (×4): qty 20

## 2021-03-08 MED ORDER — OXYCODONE-ACETAMINOPHEN 10-325 MG PO TABS: 1.0000 | ORAL_TABLET | Freq: Four times a day (QID) | ORAL | Status: DC | PRN

## 2021-03-08 MED ORDER — ZOLPIDEM TARTRATE 5 MG PO TABS
10.0000 mg | ORAL_TABLET | Freq: Once | ORAL | Status: AC
  Administered 2021-03-09: 10 mg via ORAL
  Filled 2021-03-08: qty 2

## 2021-03-08 MED ORDER — LACTATED RINGERS IV SOLN: INTRAVENOUS | Status: DC

## 2021-03-08 MED ORDER — FENTANYL CITRATE (PF) 100 MCG/2ML IJ SOLN
50.0000 ug | Freq: Once | INTRAMUSCULAR | Status: AC
  Administered 2021-03-09: 50 ug via INTRAVENOUS
  Filled 2021-03-08: qty 2

## 2021-03-08 MED ORDER — CHLORHEXIDINE GLUCONATE CLOTH 2 % EX PADS
6.0000 | MEDICATED_PAD | Freq: Every day | CUTANEOUS | Status: DC
  Administered 2021-03-08 – 2021-03-12 (×4): 6 via TOPICAL

## 2021-03-08 MED ORDER — SODIUM CHLORIDE 0.9 % IV SOLN
INTRAVENOUS | Status: DC | PRN
Start: 2021-03-08 — End: 2021-03-11

## 2021-03-08 NOTE — Progress Notes (Addendum)
PHARMACY - PHYSICIAN COMMUNICATION ?CRITICAL VALUE ALERT - BLOOD CULTURE IDENTIFICATION (BCID) ? ?[name] is an 56 y.o. male who presented to [facility] on 03/07/2021 with a chief complaint of sepsis. ? ?Assessment:  E. Coli, no resistance mechanisms ? ?Name of physician (or Provider) Contacted: Dr. Tacy Learn, MD ? ?Current antibiotics: Vancomycin, Cefepime ? ?Changes to prescribed antibiotics recommended:  ?Recommend stop Vancomycin and Cefepime and de-escalate to Cetriaxone 2g IV daily until sensitivities return. ? ?Results for orders placed or performed during the hospital encounter of 03/07/21  ?Blood Culture ID Panel (Reflexed) (Collected: 03/07/2021 11:25 AM)  ?Result Value Ref Range  ? Enterococcus faecalis NOT DETECTED NOT DETECTED  ? Enterococcus Faecium NOT DETECTED NOT DETECTED  ? Listeria monocytogenes NOT DETECTED NOT DETECTED  ? Staphylococcus species NOT DETECTED NOT DETECTED  ? Staphylococcus aureus (BCID) NOT DETECTED NOT DETECTED  ? Staphylococcus epidermidis NOT DETECTED NOT DETECTED  ? Staphylococcus lugdunensis NOT DETECTED NOT DETECTED  ? Streptococcus species NOT DETECTED NOT DETECTED  ? Streptococcus agalactiae NOT DETECTED NOT DETECTED  ? Streptococcus pneumoniae NOT DETECTED NOT DETECTED  ? Streptococcus pyogenes NOT DETECTED NOT DETECTED  ? A.calcoaceticus-baumannii NOT DETECTED NOT DETECTED  ? Bacteroides fragilis NOT DETECTED NOT DETECTED  ? Enterobacterales DETECTED (A) NOT DETECTED  ? Enterobacter cloacae complex NOT DETECTED NOT DETECTED  ? Escherichia coli DETECTED (A) NOT DETECTED  ? Klebsiella aerogenes NOT DETECTED NOT DETECTED  ? Klebsiella oxytoca NOT DETECTED NOT DETECTED  ? Klebsiella pneumoniae NOT DETECTED NOT DETECTED  ? Proteus species NOT DETECTED NOT DETECTED  ? Salmonella species NOT DETECTED NOT DETECTED  ? Serratia marcescens NOT DETECTED NOT DETECTED  ? Haemophilus influenzae NOT DETECTED NOT DETECTED  ? Neisseria meningitidis NOT DETECTED NOT DETECTED  ?  Pseudomonas aeruginosa NOT DETECTED NOT DETECTED  ? Stenotrophomonas maltophilia NOT DETECTED NOT DETECTED  ? Candida albicans NOT DETECTED NOT DETECTED  ? Candida auris NOT DETECTED NOT DETECTED  ? Candida glabrata NOT DETECTED NOT DETECTED  ? Candida krusei NOT DETECTED NOT DETECTED  ? Candida parapsilosis NOT DETECTED NOT DETECTED  ? Candida tropicalis NOT DETECTED NOT DETECTED  ? Cryptococcus neoformans/gattii NOT DETECTED NOT DETECTED  ? CTX-M ESBL NOT DETECTED NOT DETECTED  ? Carbapenem resistance IMP NOT DETECTED NOT DETECTED  ? Carbapenem resistance KPC NOT DETECTED NOT DETECTED  ? Carbapenem resistance NDM NOT DETECTED NOT DETECTED  ? Carbapenem resist OXA 48 LIKE NOT DETECTED NOT DETECTED  ? Carbapenem resistance VIM NOT DETECTED NOT DETECTED  ? ? ?[name], PharmD ?PGY1 Pharmacy Resident ?03/08/2021  8:50 AM ? ?Please check [email or web address] for unit-specific pharmacy phone numbers. ? ? ?

## 2021-03-08 NOTE — ED Notes (Addendum)
RT called for placement of ART line will down shortly  ?

## 2021-03-08 NOTE — Progress Notes (Signed)
eLink Physician-Brief Progress Note ?Patient Name: [name] ?DOB: 06/02/1965 ?MRN: [id number] ? ? ?Date of Service ? 03/08/2021  ?HPI/Events of Note ? Multiple issues - Patient requests Dilaudid that was D/Ced today d/t hypotension.  and 2. Hyperkalemia - K+ = 5.0. Patient requests Lokelma.  ?eICU Interventions ? Plan: ?Lokelma 10 gm PO X 1 now.   ? ? ? ?Intervention Category ?Major Interventions: Electrolyte abnormality - evaluation and management;Other: ? ?[name] ?03/08/2021, 8:43 PM ?

## 2021-03-08 NOTE — Plan of Care (Addendum)
03/08/21 12:05 AM ? ?Contacted by RN for BPs 49F-02O systolic with maps 37C-58I.  RN reports patient is asymptomatic, alert and oriented. ? ?Patient seen in the ED at bedside.  Patient is alert and oriented, reports feels well.  Reports long history of vascular surgery, per chart review, has extensive history of vascular surgery with Dr. Donzetta Matters and Dr. Trula Slade.  Patient reports that when his blood pressures truly are in the 80s he is symptomatic.  Denies lightheadedness and shortness of breath. Endorses pain at bilateral flanks from wounds. ? ?Recycled BP cuff and BP 129/107.  Patient appears mildly short of breath on RA on exam.  Crackles bilateral bases.  Otherwise patient alert and oriented resting comfortably, enjoying peach cobbler. ? ?On assessment, suspect significant history of multiple vascular surgeries is confounding accuracy of blood pressure measurements.  Suspect his blood pressure is higher than we are able to get with automatic or manual reads.  Suspect the BP 129/107 is a more accurate reflection of his current blood pressure given his current mental status.  Suspect fluid boluses given in the ED may be causing some fluid overload and pulmonary edema.  We will decrease maintenance fluid rate.  Discussed assessment and plan with RN. ? ?Plan: ?-Reduce fluid rate to 50 cc/hour for 4 more hours ?-We will continue to closely monitor Bps ?-Okay to give Dilaudid as needed dose for pain ? ?Wayland Denis, MD ?03/08/21,  12:16 AM ?Pager: [phone] ?Internal Medicine Resident, PGY-1 ?[name]  ?  ? ?

## 2021-03-08 NOTE — ED Notes (Addendum)
MD Zinoviev to consult PCCM - per MD they are going to contact RT to place an ART line on pt to better monitor his BP as that is the recommendation by PCCM  ?

## 2021-03-08 NOTE — Progress Notes (Signed)
Pt is extremely irritated and agitated that his blood pressure is better and he cant have IV Dilaudid now.  Pt verbalizing how the ER doctor gave it to him and how can this doctor not give it to him when his pressure is better.  Patient made aware that the risk of giving IV medication and dropping his pressure again was to high and MD didn't order. ?Pt venting and requesting if other MD can be called to give him medication for pain.  Patient aware that Critical care MD is who is on call at this time.  Patient states no, he was just saying his own nephrologist would order meds. ? ?

## 2021-03-08 NOTE — ED Notes (Addendum)
Rapid response RN at bedside to assess pt.  ?MD Zinoviev notified of continuous hypotension even after reclining pt and taking on pts leg. This RN voiced concern to MD Zinoviev that no unit is going to take pt with BP that low and that this RN does not feel comfortable giving dilaudid at this time  ?

## 2021-03-08 NOTE — ED Notes (Signed)
PCCM to come see pt  ?

## 2021-03-08 NOTE — ED Notes (Addendum)
MD's notified that pt cont to be hypotensive  ?

## 2021-03-08 NOTE — Consult Note (Signed)
NAME:  [name], MRN:  [id number], DOB:  Feb 08, 1965, LOS: 1 ADMISSION DATE:  03/07/2021, CONSULTATION DATE:  03/08/21  REFERRING MD:  Graciella Freer , CHIEF COMPLAINT:  flank pain   History of Present Illness:  56 year old man sent to ED from dialysis.  Reportedly hypotensive.  Systolics in the 62M.  He did not feel well.  He received fluid bolus about 1.75 L per ordered fluid boluses in the MAR.  Symptoms improved.  He had persistent hypotension on blood pressure cuff.  He states this is always an accurate given his multiple vascular procedures and poor vasculature.  He says when his systolic is less than 90 he can tell he gets lightheaded, symptomatic.  He feels much improved since the fluid.  He is not tachycardic.  His lactate improved with initial fluid bolus.   Admission labs notable for hypochloremia and hyponatremia.  Mild elevation in lactic acid 2.4 to decrease to 1.8.  Mild leukopenia WBC 3.4, anemia improved hemoglobin 12.2 from 8.82/22, stable thrombocytopenia.  Chest x-ray 03/07/2021 reviewed and interpreted as clear lungs bilaterally.  CT abdomen pelvis with with IV contrast 03/07/2021 reviewed and notable for chronic renal stones.  Pertinent  Medical History  ESRD on HD, hypothyroid, chronic pain, ileostomy in place  Significant Hospital Events: Including procedures, antibiotic start and stop dates in addition to other pertinent events   3/6 sent to ED from dialysis unit for reported hypotension, febrile in the ED, treated for sepsis with fluid bolus with improved symptoms per patient, started on broad-spectrum antibiotics 3/7 PCCM consult for ongoing hypotension seems relatively stable since presentation to the ED in the a.m. of 3/6  Interim History / Subjective:  As above  Objective   Blood pressure (!) 77/46, pulse 79, temperature 99.8 F (37.7 C), temperature source Oral, resp. rate (!) 23, SpO2 100 %.       No intake or output data in the 24 hours ending 03/08/21 0250 There  were no vitals filed for this visit.  Examination: General: Chronically ill-appearing, sitting up in stretcher HENT: No icterus, EOMI Lungs: Clear, distant Cardiovascular: Regular rate and rhythm, midsystolic murmur Abdomen: Nondistended, nontender, ileostomy in place Extremities: Edematous, ruddy erythema Neuro: Alert, oriented, without focal deficit  Resolved Hospital Problem list   N/a  Assessment & Plan:  Hypotension: Suspect related to severe sepsis with initial elevated lactate.  Lactate cleared and within normal limits after fluid bolus.  Symptomatically he has improved.  Element of chronic hypotension here.  In addition, suspect cuff pressures are inaccurate given his multiple vascular procedures as well as his likely peripheral arterial disease. Could represent low output state in setting of severe aortic stenosis. --Stop maintenance fluids --Consider midodrine, as evidence of chronic hypotension this was attempted to be used in the past although he had headache with this --MAP goal greater than 55, would consider use of pressors if he is feeling symptomatic or if repeat lactate in the future is elevated --consider repeat TTE --hold lasix for now  Severe sepsis: with elevated lactate on admission.  Sources include nephrostomy wound, femoral catheter. -- Continue broad-spectrum antibiotic, continue cefepime, status post vancomycin load  Chronic flank pain due to chronic renal stones: --Resume home oxycodone  Hypothyroid: --Continue Synthroid  ESRD on HD: With limited vascular access points.  Last received dialysis 3/6 with 3.5 kg removed of target 4.5 kg.  Estimated dry weight 121 kg per his report. --Notify nephrology in the morning  Severe aortic stenosis: not TAVR candidate it seems  in past. --Consider repeat TTE  Best Practice (right click and "Reselect all SmartList Selections" daily)   Diet/type: Regular consistency (see orders) DVT prophylaxis: prophylactic  heparin  GI prophylaxis: N/A Lines: Dialysis Catheter and yes and it is still needed Foley:  N/A Code Status:  full code Last date of multidisciplinary goals of care discussion [n/a, full code confirmed, if he were to lose function of his hands he would not want to go along with aggressive measures]  Labs   CBC: Recent Labs  Lab 03/07/21 1125  WBC 3.4*  NEUTROABS 3.3  HGB 12.2*  HCT 39.8  MCV 99.3  PLT 108*    Basic Metabolic Panel: Recent Labs  Lab 03/07/21 1125  NA 132*  K 3.7  CL 93*  CO2 26  GLUCOSE 92  BUN 28*  CREATININE 7.25*  CALCIUM 8.4*   GFR: CrCl cannot be calculated (Unknown ideal weight.). Recent Labs  Lab 03/07/21 1125 03/07/21 1525  WBC 3.4*  --   LATICACIDVEN 2.4* 1.8    Liver Function Tests: Recent Labs  Lab 03/07/21 1125  AST 16  ALT 11  ALKPHOS 149*  BILITOT 0.5  PROT 7.3  ALBUMIN 3.5   No results for input(s): LIPASE, AMYLASE in the last 168 hours. No results for input(s): AMMONIA in the last 168 hours.  ABG    Component Value Date/Time   PHART 7.488 (H) 01/20/2016 0950   PCO2ART 33.9 01/20/2016 0950   PO2ART 147 (H) 01/20/2016 0950   HCO3 25.4 01/20/2016 0950   TCO2 23 02/06/2020 1502   O2SAT 99.0 01/20/2016 0950     Coagulation Profile: Recent Labs  Lab 03/07/21 1125  INR 1.2    Cardiac Enzymes: No results for input(s): CKTOTAL, CKMB, CKMBINDEX, TROPONINI in the last 168 hours.  HbA1C: Hgb A1c MFr Bld  Date/Time Value Ref Range Status  10/17/2019 07:19 AM 4.5 (L) 4.8 - 5.6 % Final    Comment:    (NOTE)         Prediabetes: 5.7 - 6.4         Diabetes: >6.4         Glycemic control for adults with diabetes: <7.0     CBG: No results for input(s): GLUCAP in the last 168 hours.  Review of Systems:   He denies chest pain.  He denies syncope or presyncope.  Denies lightheadedness.  Comprehensive review of systems otherwise negative.  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   Surgical History:   Past Surgical History:  Procedure Laterality Date   A/V FISTULAGRAM Right 02/03/2020   Procedure: A/V Fistulagram;  Surgeon: Serafina Mitchell, MD;  Location: [place] [facility];  Service: Cardiovascular;  Laterality: Right;   AMPUTATION Left 12/11/2017   Procedure: Left Great Toe Amputation;  Surgeon: [name], MD;  Location: [place];  Service: Orthopedics;  Laterality: Left;   APPENDECTOMY     APPLICATION OF WOUND VAC Left 10/04/2019   Procedure: APPLICATION OF WOUND VAC;  Surgeon: Serafina Mitchell, MD;  Location: [facility];  Service: Vascular;  Laterality: Left;   ARTERIOVENOUS GRAFT PLACEMENT Left 10/16/2012   left femoral goretex graft  Dr Donnetta Hutching   AV FISTULA PLACEMENT Left 06/27/2012   Procedure: EXPLORATORY LEFT THY-GRAFT PSEUDO-ANEURYSM;  Surgeon: Conrad Groom, MD;  Location: [place];  Service: Vascular;  Laterality: Left;  Revision of left Arteriovenus gortex graft in thigh.   [place] REMOVAL Left 10/04/2019   Procedure: REMOVAL OF ARTERIOVENOUS GORETEX GRAFT ([place]);  Surgeon: Serafina Mitchell, MD;  Location: [place];  Service: Vascular;  Laterality: Left;   COLONOSCOPY     DRESSING CHANGE UNDER ANESTHESIA Left 10/07/2019   Procedure: DRESSING CHANGE UNDER ANESTHESIA;  Surgeon: Meredith Pel, MD;  Location: [place];  Service: Orthopedics;  Laterality: Left;   EXCHANGE OF A DIALYSIS CATHETER Right 02/06/2020   Procedure: EXCHANGE OF TUNNELED DIALYSIS CATHETER;  Surgeon: Serafina Mitchell, MD;  Location: [place];  Service: Vascular;  Laterality: Right;   FLEXIBLE SIGMOIDOSCOPY N/A 12/03/2018   Procedure:  FLEXIBLE SIGMOIDOSCOPY;  Surgeon: Yetta Flock, MD;  Location: [place];  Service: Gastroenterology;  Laterality: N/A;   I & D EXTREMITY Left 02/29/2016   Procedure: DEBRIDEMENT LEFT THIGH WOUND;  Surgeon: Waynetta Sandy, MD;  Location: [place];  Service: Vascular;  Laterality: Left;   I & D EXTREMITY Left 10/04/2019   Procedure: IRRIGATION AND DEBRIDEMENT LEFT THIGH;  Surgeon: Serafina Mitchell, MD;  Location: [place];  Service: Vascular;  Laterality: Left;   I & D EXTREMITY Right 12/09/2019   Procedure: IRRIGATION AND DEBRIDEMENT EXTREMITY;  Surgeon: Altamese [place], MD;  Location: [place];  Service: Orthopedics;  Laterality: Right;   ILEOSTOMY  1970s   INCISION AND DRAINAGE Left 10/16/2012   Procedure: INCISION AND Debridement left thigh graft;  Surgeon: Rosetta Posner, MD;  Location: [place];  Service: Vascular;  Laterality: Left;   INSERTION OF DIALYSIS CATHETER     INSERTION OF DIALYSIS CATHETER  01/14/2016   Procedure: INSERTION OF DIALYSIS CATHETER;  Surgeon: Waynetta Sandy, MD;  Location: [place];  Service: Vascular;;   INSERTION OF DIALYSIS CATHETER Right 08/07/2017   Procedure: INSERTION OF DIALYSIS CATHETER;  Surgeon: Waynetta Sandy, MD;  Location: [place];  Service: Vascular;  Laterality: Right;   INSERTION OF DIALYSIS CATHETER Right 12/21/2017   Procedure: RIGHT FEMORAL DIALYSIS CATHETER EXCHANGE;  Surgeon: Marty Heck, MD;  Location: [place];  Service: Vascular;  Laterality: Right;   INSERTION OF ILIAC STENT Left 01/14/2016   Procedure: INSERTION OF ILIAC STENT;  Surgeon: Waynetta Sandy, MD;  Location: [place];  Service: Vascular;  Laterality: Left;   INTRAOPERATIVE ARTERIOGRAM Left 01/14/2016   Procedure: INTRA OPERATIVE ARTERIOGRAM;  Surgeon: Waynetta Sandy, MD;  Location: [place];  Service: Vascular;  Laterality: Left;   IR NEPHROSTOMY EXCHANGE LEFT  03/21/2019   IR NEPHROSTOMY PLACEMENT LEFT  11/28/2018   IRRIGATION AND DEBRIDEMENT  EXTREMITY (Right   12/09/2019   KNEE ARTHROSCOPY Left 10/07/2019   Procedure: ARTHROSCOPY KNEE w/ STIMULATING  BEAD PLACMENT;  Surgeon: Meredith Pel, MD;  Location: [place];  Service: Orthopedics;  Laterality: Left;   LEFT HEART CATH AND CORONARY ANGIOGRAPHY N/A 10/17/2019   Procedure: LEFT HEART CATH AND CORONARY ANGIOGRAPHY;  Surgeon: Jettie Booze, MD;  Location: [place] [facility];  Service: Cardiovascular;  Laterality: N/A;   LITHOTRIPSY  x 3   "& a laser treatment" (02/29/2016)   NEPHROLITHOTOMY Left 03/26/2019   Procedure: NEPHROLITHOTOMY PERCUTANEOUS;  Surgeon: Alexis Frock, MD;  Location: [facility];  Service: Urology;  Laterality: Left;  2 HRS   NEPHROSTOMY Bilateral 1968  PATELLAR TENDON REPAIR Right 1990s   "big incision"   PERIPHERAL VASCULAR CATHETERIZATION  01/14/2016   Procedure: A/V SHUNTOGRAM;  Surgeon: Waynetta Sandy, MD;  Location: [place];  Service: Vascular;;   REMOVAL OF GRAFT Left 01/30/2020   Procedure: REMOVAL OF INFECTED AV GRAFT LEFT THIGH;  Surgeon: Serafina Mitchell, MD;  Location: [place];  Service: Vascular;  Laterality: Left;   REVISION OF ARTERIOVENOUS GORETEX GRAFT Left 10/16/2012   Procedure: REVISION OF LEFT FEMORAL LOOP ARTERIOVENOUS GORETEX GRAFT;  Surgeon: Rosetta Posner, MD;  Location: [place];  Service: Vascular;  Laterality: Left;   REVISION OF ARTERIOVENOUS GORETEX GRAFT Left 02/11/2015   Procedure: EXCISION OF SMALL SEGMENT OF EXPOSED LEFT THIGH NON FUNCTIONING  ARTERIOVENOUS GORETEX GRAFT;  Surgeon: Mal Misty, MD;  Location: [place];  Service: Vascular;  Laterality: Left;   REVISION OF ARTERIOVENOUS GORETEX GRAFT Left 01/14/2016   Procedure: REVISION OF ARTERIOVENOUS GORETEX GRAFT;  Surgeon: Waynetta Sandy, MD;  Location: [place];  Service: Vascular;  Laterality: Left;   REVISION OF ARTERIOVENOUS GORETEX GRAFT Left 02/29/2016   thigh/pt report   REVISION OF ARTERIOVENOUS GORETEX GRAFT Left 02/29/2016   Procedure: POSSIBLE REVISION  OF LEFT THIGH ARTERIOVENOUS GORETEX GRAFT;  Surgeon: Waynetta Sandy, MD;  Location: [place];  Service: Vascular;  Laterality: Left;   TEE WITHOUT CARDIOVERSION N/A 12/14/2017   Procedure: TRANSESOPHAGEAL ECHOCARDIOGRAM (TEE);  Surgeon: Acie Fredrickson, Wonda Cheng, MD;  Location: [place];  Service: Cardiovascular;  Laterality: N/A;   THROMBECTOMY AND REVISION OF ARTERIOVENTOUS (AV) GORETEX  GRAFT Left 06/12/2018   Procedure: Removal of infected left thigh graft;  Surgeon: Rosetta Posner, MD;  Location: [place];  Service: Vascular;  Laterality: Left;   THROMBECTOMY W/ EMBOLECTOMY Left 01/14/2016   Procedure: THROMBECTOMY ARTERIOVENOUS GORE-TEX Left thigh GRAFT;  Surgeon: Waynetta Sandy, MD;  Location: [place];  Service: Vascular;  Laterality: Left;   TONGUE SURGERY  ~ 1990   tongue-tie release    ULTRASOUND GUIDANCE FOR VASCULAR ACCESS Right 08/07/2017   Procedure: ULTRASOUND GUIDANCE FOR VASCULAR CANNULATION RIGHT FEMORAL VEIN AND LEFT AV FEMORAL GRAFT;  Surgeon: Waynetta Sandy, MD;  Location: [place];  Service: Vascular;  Laterality: Right;   UPPER EXTREMITY VENOGRAPHY Bilateral 08/09/2017   Procedure: UPPER EXTREMITY VENOGRAPHY;  Surgeon: Serafina Mitchell, MD;  Location: [place] [facility];  Service: Cardiovascular;  Laterality: Bilateral;   VENOGRAM N/A 09/20/2017   Procedure: RIGHT COMMON FEMORAL ARTERY EXPLORATION.  CANNULATION RIGHT COMMON FEMORAL VEIN. VENOGRAM CENTRAL ULTRA SOUND GUIDED RIGHT FEMORAL VEIN TIMES TWO.;  Surgeon: Waynetta Sandy, MD;  Location: [place];  Service: Vascular;  Laterality: N/A;   VENOGRAM Right 10/04/2019   Procedure: VENOGRAM;  Surgeon: Serafina Mitchell, MD;  [facility];  Service: Vascular;  Laterality: Right;   WOUND DEBRIDEMENT Left 02/29/2016   thigh     Social History:   reports that he has never smoked. He has never used smokeless tobacco. He reports that he does not currently use alcohol. He reports that he does not currently use  drugs after having used the following drugs: Marijuana.   Family History:  His family history includes Bipolar disorder in his sister; Diabetes in his father, mother, and sister; Heart attack in his father; Heart disease in his mother; Hypertension in his mother.   Allergies Allergies  Allergen Reactions   Hydrocodone Other (See Comments)    Caused involuntary movement and twitching. CANNOT TAKE DUE TO MUSCLE SPASMS AND MUSCLE  TREMORS Tolerates hydromorphone and oxycodone.   Furadantin [Nitrofurantoin] Other (See Comments)    UNSPECIFIED REACTION    Mandelamine [Methenamine] Other (See Comments)    UNSPECIFIED REACTION    Noroxin [Norfloxacin] Other (See Comments)    UNSPECIFIED REACTION    Colace [Docusate] Diarrhea and Nausea And Vomiting   Elemental Sulfur Cough    Childhood reaction - pt could not confirm that it was a cough   Sulfa Antibiotics Other (See Comments) and Cough    Childhood reaction - pt could not confirm that it was a cough     Home Medications  Prior to Admission medications   Medication Sig Start Date End Date Taking? Authorizing Provider  acetaminophen (TYLENOL) 500 MG tablet Take 1,000 mg by mouth every 6 (six) hours as needed for mild pain or headache.   Yes [provider]  aspirin EC 325 MG tablet Take 325 mg by mouth daily.   Yes [provider]  calcium acetate (PHOSLO) 667 MG capsule Take 3-4 capsules (2,001-2,668 mg total) by mouth See admin instructions. Take 2708 mg by mouth 3 times daily with meals, take 2001 mg by mouth with snacks Patient taking differently: Take 2,668 mg by mouth 3 (three) times daily with meals. 01/14/18  Yes Hennie Duos, MD  cinacalcet [facility]) 30 MG tablet Take 1 tablet (30 mg total) by mouth every Monday, Wednesday, and Friday with hemodialysis. 02/18/20  Yes Lacinda Axon, MD  clopidogrel (PLAVIX) 75 MG tablet Take 75 mg by mouth daily.   Yes [provider]  diclofenac Sodium (VOLTAREN)  1 % GEL Apply 2 g topically daily as needed (For leg pain).   Yes [provider]  doxercalciferol (HECTOROL) 4 MCG/2ML injection Inject 2 mLs (4 mcg total) into the vein every Monday, Wednesday, and Friday with hemodialysis. 02/18/20  Yes Lacinda Axon, MD  escitalopram (LEXAPRO) 5 MG tablet Take 5 mg by mouth daily.   Yes [provider]  famotidine (PEPCID) 40 MG tablet Take 40 mg by mouth daily. 09/01/10  Yes [provider]  fluticasone (FLONASE) 50 MCG/ACT nasal spray Place 2 sprays into both nostrils as needed for allergies or rhinitis (congestion). Patient taking differently: Place 2 sprays into both nostrils daily as needed for allergies or rhinitis (congestion). 01/14/18  Yes Hennie Duos, MD  furosemide (LASIX) 80 MG tablet Take 80-160 mg by mouth See admin instructions. Take 80 mg daily and may take an extra 80 mg if needed for fluid 04/20/20  Yes [provider]  levothyroxine (SYNTHROID) 137 MCG tablet Take 137 mcg by mouth daily before breakfast.   Yes [provider]  LORazepam (ATIVAN) 1 MG tablet Take 1 mg by mouth 2 (two) times daily as needed for anxiety.   Yes [provider]  Melatonin 10 MG TABS Take 10 mg by mouth at bedtime as needed (sleep).   Yes [provider]  Nutritional Supplements (NEPRO) LIQD Take 237 mLs by mouth daily. Mixed Berry   Yes [provider]  oxyCODONE-acetaminophen (PERCOCET) 10-325 MG tablet Take 1 tablet by mouth every 6 (six) hours as needed for pain. 02/04/21  Yes [provider]  polyethylene glycol (MIRALAX / GLYCOLAX) 17 g packet Take 17 g by mouth daily. Patient taking differently: Take 17 g by mouth daily as needed for mild constipation. 02/18/20  Yes Lacinda Axon, MD  promethazine (PHENERGAN) 25 MG tablet Take 1 tablet (25 mg total) by mouth 2 (two) times daily as  needed for nausea or vomiting. 01/14/18  Yes Hennie Duos, MD  rosuvastatin (CRESTOR)  10 MG tablet Take 1 tablet (10 mg total) by mouth daily. 10/18/19 03/07/21 Yes Br[place], Ronnette Juniper, DO  senna (SENOKOT) 8.6 MG TABS tablet Take 2 tablets (17.2 mg total) by mouth daily as needed for mild constipation. 02/18/20  Yes Lacinda Axon, MD  sodium polystyrene (KAYEXALATE) powder Take 15 g by mouth See admin instructions. Take 15 g (mixed in water) by mouth on Saturdays and Sundays Patient taking differently: Take 15 g by mouth See admin instructions. Take 15 g (mixed in water) by mouth on Saturdays and Sundays & Tuesday, Thursday. 01/14/18  Yes Hennie Duos, MD  traZODone (DESYREL) 100 MG tablet Take 1 tablet (100 mg total) by mouth at bedtime. 01/14/18  Yes Hennie Duos, MD  zolpidem (AMBIEN) 10 MG tablet Take 1 tablet (10 mg total) by mouth at bedtime. 01/14/18  Yes Hennie Duos, MD     Critical care time: n/a    I spent 65 minutes in the care of the patient including coordination of care with multiple discussions with primary team, bedside nursing, face-to-face visit, review of records.  Lanier Clam, MD  See Shea Evans on for contact info

## 2021-03-08 NOTE — Progress Notes (Signed)
eLink Physician-Brief Progress Note ?Patient Name: [name] ?DOB: 1965-03-03 ?MRN: [id number] ? ? ?Date of Service ? 03/08/2021  ?HPI/Events of Note ? Patient c/o pain, however, refuses to take ordered Oxycodone IR + Tylenol. Patient states that the Oxycodone IR + Tylenol "does not work". Patient also requests home Ambien. Patient has had issues with hypotension with Dilaudid IV which is now D/Ced.   ?eICU Interventions ? Plan: ?Ambien 10 mg PO X 1 now. ?Fentanyl 50 mcg IV X 1 now.   ? ? ? ?Intervention Category ?Major Interventions: Other: ? ?Mayanna Garlitz Cornelia Copa ?03/08/2021, 10:22 PM ?

## 2021-03-08 NOTE — Progress Notes (Signed)
RT Note: RTx2 unsuccessful at placing Arterial line. RN aware. ?

## 2021-03-08 NOTE — ED Notes (Addendum)
MD Zinoviev notified of current temp - Additionally this RN notified MD Zinoviev that this RN does not feel comfortable giving dilaudid d/t pt low BP - Per MD "since he is ESRD and already receiving tylenol 1g TID, if you are not comfortable giving opiate meds based on his BP numbers, all we can offer him is volteran gel unfortunately, meds used for neuropathic pain are not going to be helpful for these wounds" ?

## 2021-03-08 NOTE — ED Notes (Signed)
MD Zinoviev notified that RT was unsuccessful x2 in placing ART line - going to reach out to PCCM  ?

## 2021-03-08 NOTE — Progress Notes (Signed)
Pt offered nighttime medication, when offering trazodone patient states that he takes Ambien with it for sleep, why didn't he get it.  Pt is requesting Ambien for sleep and states that it is 10 mg. Pt also states that he is not going to be in pain all night, offered pain medications again and he refused.   ? Stating that they aren't helping...but if he doubles them I'll take it.  When asking for clarification, Pt states that If the MD doubles my pain medication, saying that I can take 2 of them, 2 of each, that might help me. ?

## 2021-03-08 NOTE — ED Notes (Signed)
ICU MD at bedside

## 2021-03-08 NOTE — Progress Notes (Signed)
NAME:  [name], MRN:  [id number], DOB:  1966-01-02, LOS: 1 ADMISSION DATE:  03/07/2021, CONSULTATION DATE:  03/08/21  REFERRING MD:  Graciella Freer , CHIEF COMPLAINT:  flank pain   History of Present Illness:  56 year old man sent to ED from dialysis.  Reportedly hypotensive.  Systolics in the 62M.  He did not feel well.  He received fluid bolus about 1.75 L per ordered fluid boluses in the MAR.  Symptoms improved.  He had persistent hypotension on blood pressure cuff.  He states this is always an accurate given his multiple vascular procedures and poor vasculature.  He says when his systolic is less than 90 he can tell he gets lightheaded, symptomatic.  He feels much improved since the fluid.  He is not tachycardic.  His lactate improved with initial fluid bolus.   Admission labs notable for hypochloremia and hyponatremia.  Mild elevation in lactic acid 2.4 to decrease to 1.8.  Mild leukopenia WBC 3.4, anemia improved hemoglobin 12.2 from 8.82/22, stable thrombocytopenia.  Chest x-ray 03/07/2021 reviewed and interpreted as clear lungs bilaterally.  CT abdomen pelvis with with IV contrast 03/07/2021 reviewed and notable for chronic renal stones.  Pertinent  Medical History  ESRD on HD, hypothyroid, chronic pain, ileostomy in place  Significant Hospital Events: Including procedures, antibiotic start and stop dates in addition to other pertinent events   3/6 sent to ED from dialysis unit for reported hypotension, febrile in the ED, treated for sepsis with fluid bolus with improved symptoms per patient, started on broad-spectrum antibiotics 3/7 PCCM consult for ongoing hypotension seems relatively stable since presentation to the ED in the a.m. of 3/6; Bld cx   Interim History / Subjective:   Continues to have abdominal and back pain. Desires dilaudid if blood pressure continues to go up.    AM labs K 5.0, Hgb 10.6, INR 1.6, Bld cx w/ GNR  Objective   Blood pressure (!) 88/57, pulse 78, temperature  (!) 101.6 F (38.7 C), temperature source Oral, resp. rate 14, height 5\' 11"  (1.803 m), weight 123.7 kg, SpO2 94 %.       No intake or output data in the 24 hours ending 03/08/21 0727 Filed Weights   03/08/21 0340  Weight: 123.7 kg    Examination: General: Chronically ill, no acute distress. Age appropriate. Cardiac: RRR, blowing systolic murmur at lower LSB Respiratory: CTAB, normal effort Abdomen: soft, diffusely tender to palpation, mildly distended Extremities: Non pitting + LE edema Skin: Warm and dry, no rashes noted. Nephrostomy dressings are clean bilaterally Neuro: alert and oriented x4 Psych: normal affect  Resolved Hospital Problem list   N/a  Assessment & Plan:   Hypotension 2/2 Severe sepsis- GNR MAP goal >55. Most recently 46. Aymptomatic? Possible sources include nephrostomy wound, femoral catheter. Febrile 101.6. Bld cx grew GNR. S/p vancomycin.  - Start midodrine 10 mg TID -Consider use of pressors  - Discontinue cefepime, changed to CTX - Follow up bld cx ID panel narrow abx as appropriate - Contact IR for nephrostomy maintenance  Chronic flank pain 2/2 chronic renal stones -Percocet 1 tab q6h PRN, oxycodone 5 mg q6h PRN  Hypothyroidism -Continue Synthroid  ESRD on HD  With limited vascular access points.  Last received dialysis 3/6 with 3.5 kg removed of target 4.5 kg.  Estimated dry weight 121 kg per his report. --Nephrology aware  Severe aortic stenosis EF 65-70% GIIDD. Abnormality of mitral valve and severe aortic stenosis.  -Consider repeat TTE -Continue plavix, asa, crestor  Anxiety -Continue home lexapro and trazadone  Best Practice (right click and "Reselect all SmartList Selections" daily)   Diet/type: Regular consistency (see orders) DVT prophylaxis: prophylactic heparin  GI prophylaxis: N/A Lines: Dialysis Catheter and yes and it is still needed Foley:  N/A Code Status:  full code Last date of multidisciplinary goals of care  discussion [n/a, full code confirmed, if he were to lose function of his hands he would not want to go along with aggressive measures]  Labs   CBC: Recent Labs  Lab 03/07/21 1125 03/08/21 0631  WBC 3.4* 4.4  NEUTROABS 3.3 4.1  HGB 12.2* 10.6*  HCT 39.8 33.3*  MCV 99.3 96.5  PLT 108* 75*     Basic Metabolic Panel: Recent Labs  Lab 03/07/21 1125 03/08/21 0631  NA 132* 132*  K 3.7 5.0  CL 93* 92*  CO2 26 26  GLUCOSE 92 79  BUN 28* 45*  CREATININE 7.25* 8.60*  CALCIUM 8.4* 7.5*    GFR: Estimated Creatinine Clearance: 13 mL/min (A) (by C-G formula based on SCr of 8.6 mg/dL (H)). Recent Labs  Lab 03/07/21 1125 03/07/21 1525 03/08/21 0631  WBC 3.4*  --  4.4  LATICACIDVEN 2.4* 1.8  --      Liver Function Tests: Recent Labs  Lab 03/07/21 1125 03/08/21 0631  AST 16 26  ALT 11 17  ALKPHOS 149* 115  BILITOT 0.5 0.5  PROT 7.3 6.1*  ALBUMIN 3.5 2.7*    No results for input(s): LIPASE, AMYLASE in the last 168 hours. No results for input(s): AMMONIA in the last 168 hours.  ABG    Component Value Date/Time   PHART 7.488 (H) 01/20/2016 0950   PCO2ART 33.9 01/20/2016 0950   PO2ART 147 (H) 01/20/2016 0950   HCO3 25.4 01/20/2016 0950   TCO2 23 02/06/2020 1502   O2SAT 99.0 01/20/2016 0950      Coagulation Profile: Recent Labs  Lab 03/07/21 1125 03/08/21 0631  INR 1.2 1.6*     Cardiac Enzymes: No results for input(s): CKTOTAL, CKMB, CKMBINDEX, TROPONINI in the last 168 hours.  HbA1C: Hgb A1c MFr Bld  Date/Time Value Ref Range Status  10/17/2019 07:19 AM 4.5 (L) 4.8 - 5.6 % Final    Comment:    (NOTE)         Prediabetes: 5.7 - 6.4         Diabetes: >6.4         Glycemic control for adults with diabetes: <7.0     CBG: Recent Labs  Lab 03/08/21 0348  GLUCAP 73    Review of Systems:   He denies chest pain.  He denies syncope or presyncope.  Denies lightheadedness.  Comprehensive review of systems otherwise negative.  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   Surgical History:   Past Surgical History:  Procedure Laterality Date   A/V FISTULAGRAM Right 02/03/2020   Procedure: A/V Fistulagram;  Surgeon: Serafina Mitchell, MD;  Location: [place] [facility];  Service: Cardiovascular;  Laterality: Right;   AMPUTATION Left 12/11/2017   Procedure: Left Great Toe Amputation;  Surgeon: [name], MD;  Location: [place];  Service: Orthopedics;  Laterality: Left;  APPENDECTOMY     APPLICATION OF WOUND VAC Left 10/04/2019   Procedure: APPLICATION OF WOUND VAC;  Surgeon: Serafina Mitchell, MD;  Location: [facility];  Service: Vascular;  Laterality: Left;   ARTERIOVENOUS GRAFT PLACEMENT Left 10/16/2012   left femoral goretex graft         Dr Donnetta Hutching   AV FISTULA PLACEMENT Left 06/27/2012   Procedure: EXPLORATORY LEFT THY-GRAFT PSEUDO-ANEURYSM;  Surgeon: Conrad [place], MD;  Location: [place];  Service: Vascular;  Laterality: Left;  Revision of left Arteriovenus gortex graft in thigh.   [place] REMOVAL Left 10/04/2019   Procedure: REMOVAL OF ARTERIOVENOUS GORETEX GRAFT ([place]);  Surgeon: Serafina Mitchell, MD;  Location: [place];  Service: Vascular;  Laterality: Left;   COLONOSCOPY     DRESSING CHANGE UNDER ANESTHESIA Left 10/07/2019   Procedure: DRESSING CHANGE UNDER ANESTHESIA;  Surgeon: Meredith Pel, MD;  Location: [place];  Service: Orthopedics;  Laterality: Left;   EXCHANGE OF A DIALYSIS CATHETER Right 02/06/2020   Procedure: EXCHANGE OF TUNNELED DIALYSIS CATHETER;  Surgeon: Serafina Mitchell, MD;  Location: [place];  Service: Vascular;  Laterality: Right;    FLEXIBLE SIGMOIDOSCOPY N/A 12/03/2018   Procedure: FLEXIBLE SIGMOIDOSCOPY;  Surgeon: Yetta Flock, MD;  Location: [place];  Service: Gastroenterology;  Laterality: N/A;   I & D EXTREMITY Left 02/29/2016   Procedure: DEBRIDEMENT LEFT THIGH WOUND;  Surgeon: Waynetta Sandy, MD;  Location: [place];  Service: Vascular;  Laterality: Left;   I & D EXTREMITY Left 10/04/2019   Procedure: IRRIGATION AND DEBRIDEMENT LEFT THIGH;  Surgeon: Serafina Mitchell, MD;  Location: [place];  Service: Vascular;  Laterality: Left;   I & D EXTREMITY Right 12/09/2019   Procedure: IRRIGATION AND DEBRIDEMENT EXTREMITY;  Surgeon: Altamese Ellensburg, MD;  Location: [place];  Service: Orthopedics;  Laterality: Right;   ILEOSTOMY  1970s   INCISION AND DRAINAGE Left 10/16/2012   Procedure: INCISION AND Debridement left thigh graft;  Surgeon: Rosetta Posner, MD;  Location: [place];  Service: Vascular;  Laterality: Left;   INSERTION OF DIALYSIS CATHETER     INSERTION OF DIALYSIS CATHETER  01/14/2016   Procedure: INSERTION OF DIALYSIS CATHETER;  Surgeon: Waynetta Sandy, MD;  Location: [place];  Service: Vascular;;   INSERTION OF DIALYSIS CATHETER Right 08/07/2017   Procedure: INSERTION OF DIALYSIS CATHETER;  Surgeon: Waynetta Sandy, MD;  Location: [place];  Service: Vascular;  Laterality: Right;   INSERTION OF DIALYSIS CATHETER Right 12/21/2017   Procedure: RIGHT FEMORAL DIALYSIS CATHETER EXCHANGE;  Surgeon: Marty Heck, MD;  Location: [place];  Service: Vascular;  Laterality: Right;   INSERTION OF ILIAC STENT Left 01/14/2016   Procedure: INSERTION OF ILIAC STENT;  Surgeon: Waynetta Sandy, MD;  Location: [place];  Service: Vascular;  Laterality: Left;   INTRAOPERATIVE ARTERIOGRAM Left 01/14/2016   Procedure: INTRA OPERATIVE ARTERIOGRAM;  Surgeon: Waynetta Sandy, MD;  Location: [place];  Service: Vascular;  Laterality: Left;   IR NEPHROSTOMY EXCHANGE LEFT  03/21/2019   IR NEPHROSTOMY  PLACEMENT LEFT  11/28/2018   IRRIGATION AND DEBRIDEMENT EXTREMITY (Right   12/09/2019   KNEE ARTHROSCOPY Left 10/07/2019   Procedure: ARTHROSCOPY KNEE w/ STIMULATING  BEAD PLACMENT;  Surgeon: Meredith Pel, MD;  Location: [place];  Service: Orthopedics;  Laterality: Left;   LEFT HEART CATH AND CORONARY ANGIOGRAPHY N/A 10/17/2019   Procedure: LEFT HEART CATH AND CORONARY ANGIOGRAPHY;  Surgeon: Jettie Booze, MD;  Location: [place]  CV LAB;  Service: Cardiovascular;  Laterality: N/A;   LITHOTRIPSY  x 3   "& a laser treatment" (02/29/2016)   NEPHROLITHOTOMY Left 03/26/2019   Procedure: NEPHROLITHOTOMY PERCUTANEOUS;  Surgeon: Alexis Frock, MD;  Location: [facility];  Service: Urology;  Laterality: Left;  2 HRS   NEPHROSTOMY Bilateral 1968   PATELLAR TENDON REPAIR Right 1990s   "big incision"   PERIPHERAL VASCULAR CATHETERIZATION  01/14/2016   Procedure: A/V SHUNTOGRAM;  Surgeon: Waynetta Sandy, MD;  Location: [place];  Service: Vascular;;   REMOVAL OF GRAFT Left 01/30/2020   Procedure: REMOVAL OF INFECTED AV GRAFT LEFT THIGH;  Surgeon: Serafina Mitchell, MD;  Location: [place];  Service: Vascular;  Laterality: Left;   REVISION OF ARTERIOVENOUS GORETEX GRAFT Left 10/16/2012   Procedure: REVISION OF LEFT FEMORAL LOOP ARTERIOVENOUS GORETEX GRAFT;  Surgeon: Rosetta Posner, MD;  Location: [place];  Service: Vascular;  Laterality: Left;   REVISION OF ARTERIOVENOUS GORETEX GRAFT Left 02/11/2015   Procedure: EXCISION OF SMALL SEGMENT OF EXPOSED LEFT THIGH NON FUNCTIONING  ARTERIOVENOUS GORETEX GRAFT;  Surgeon: Mal Misty, MD;  Location: [place];  Service: Vascular;  Laterality: Left;   REVISION OF ARTERIOVENOUS GORETEX GRAFT Left 01/14/2016   Procedure: REVISION OF ARTERIOVENOUS GORETEX GRAFT;  Surgeon: Waynetta Sandy, MD;  Location: [place];  Service: Vascular;  Laterality: Left;   REVISION OF ARTERIOVENOUS GORETEX GRAFT Left 02/29/2016   thigh/pt report   REVISION OF ARTERIOVENOUS  GORETEX GRAFT Left 02/29/2016   Procedure: POSSIBLE REVISION OF LEFT THIGH ARTERIOVENOUS GORETEX GRAFT;  Surgeon: Waynetta Sandy, MD;  Location: [place];  Service: Vascular;  Laterality: Left;   TEE WITHOUT CARDIOVERSION N/A 12/14/2017   Procedure: TRANSESOPHAGEAL ECHOCARDIOGRAM (TEE);  Surgeon: Acie Fredrickson, Wonda Cheng, MD;  Location: [place];  Service: Cardiovascular;  Laterality: N/A;   THROMBECTOMY AND REVISION OF ARTERIOVENTOUS (AV) GORETEX  GRAFT Left 06/12/2018   Procedure: Removal of infected left thigh graft;  Surgeon: Rosetta Posner, MD;  Location: [place];  Service: Vascular;  Laterality: Left;   THROMBECTOMY W/ EMBOLECTOMY Left 01/14/2016   Procedure: THROMBECTOMY ARTERIOVENOUS GORE-TEX Left thigh GRAFT;  Surgeon: Waynetta Sandy, MD;  Location: [place];  Service: Vascular;  Laterality: Left;   TONGUE SURGERY  ~ 1990   tongue-tie release    ULTRASOUND GUIDANCE FOR VASCULAR ACCESS Right 08/07/2017   Procedure: ULTRASOUND GUIDANCE FOR VASCULAR CANNULATION RIGHT FEMORAL VEIN AND LEFT AV FEMORAL GRAFT;  Surgeon: Waynetta Sandy, MD;  Location: [place];  Service: Vascular;  Laterality: Right;   UPPER EXTREMITY VENOGRAPHY Bilateral 08/09/2017   Procedure: UPPER EXTREMITY VENOGRAPHY;  Surgeon: Serafina Mitchell, MD;  Location: [place] CV LAB;  Service: Cardiovascular;  Laterality: Bilateral;   VENOGRAM N/A 09/20/2017   Procedure: RIGHT COMMON FEMORAL ARTERY EXPLORATION.  CANNULATION RIGHT COMMON FEMORAL VEIN. VENOGRAM CENTRAL ULTRA SOUND GUIDED RIGHT FEMORAL VEIN TIMES TWO.;  Surgeon: Waynetta Sandy, MD;  Location: [place];  Service: Vascular;  Laterality: N/A;   VENOGRAM Right 10/04/2019   Procedure: VENOGRAM;  Surgeon: Serafina Mitchell, MD;  [name];  Service: Vascular;  Laterality: Right;   WOUND DEBRIDEMENT Left 02/29/2016   thigh     Social History:   reports that he has never smoked. He has never used smokeless tobacco. He reports that he does not  currently use alcohol. He reports that he does not currently use drugs after having used the following drugs: Marijuana.   Family History:  His family history  includes Bipolar disorder in his sister; Diabetes in his father, mother, and sister; Heart attack in his father; Heart disease in his mother; Hypertension in his mother.   Allergies Allergies  Allergen Reactions   Hydrocodone Other (See Comments)    Caused involuntary movement and twitching. CANNOT TAKE DUE TO MUSCLE SPASMS AND MUSCLE TREMORS Tolerates hydromorphone and oxycodone.   Furadantin [Nitrofurantoin] Other (See Comments)    UNSPECIFIED REACTION    Mandelamine [Methenamine] Other (See Comments)    UNSPECIFIED REACTION    Noroxin [Norfloxacin] Other (See Comments)    UNSPECIFIED REACTION    Colace [Docusate] Diarrhea and Nausea And Vomiting   Elemental Sulfur Cough    Childhood reaction - pt could not confirm that it was a cough   Sulfa Antibiotics Other (See Comments) and Cough    Childhood reaction - pt could not confirm that it was a cough     Home Medications  Prior to Admission medications   Medication Sig Start Date End Date Taking? Authorizing Provider  acetaminophen (TYLENOL) 500 MG tablet Take 1,000 mg by mouth every 6 (six) hours as needed for mild pain or headache.   Yes [provider]  aspirin EC 325 MG tablet Take 325 mg by mouth daily.   Yes [provider]  calcium acetate (PHOSLO) 667 MG capsule Take 3-4 capsules (2,001-2,668 mg total) by mouth See admin instructions. Take 2708 mg by mouth 3 times daily with meals, take 2001 mg by mouth with snacks Patient taking differently: Take 2,668 mg by mouth 3 (three) times daily with meals. 01/14/18  Yes Hennie Duos, MD  cinacalcet [employer]) 30 MG tablet Take 1 tablet (30 mg total) by mouth every Monday, Wednesday, and Friday with hemodialysis. 02/18/20  Yes Lacinda Axon, MD  clopidogrel (PLAVIX) 75 MG tablet Take 75 mg by mouth  daily.   Yes [provider]  diclofenac Sodium (VOLTAREN) 1 % GEL Apply 2 g topically daily as needed (For leg pain).   Yes [provider]  doxercalciferol (HECTOROL) 4 MCG/2ML injection Inject 2 mLs (4 mcg total) into the vein every Monday, Wednesday, and Friday with hemodialysis. 02/18/20  Yes Lacinda Axon, MD  escitalopram (LEXAPRO) 5 MG tablet Take 5 mg by mouth daily.   Yes [provider]  famotidine (PEPCID) 40 MG tablet Take 40 mg by mouth daily. 09/01/10  Yes [provider]  fluticasone (FLONASE) 50 MCG/ACT nasal spray Place 2 sprays into both nostrils as needed for allergies or rhinitis (congestion). Patient taking differently: Place 2 sprays into both nostrils daily as needed for allergies or rhinitis (congestion). 01/14/18  Yes Hennie Duos, MD  furosemide (LASIX) 80 MG tablet Take 80-160 mg by mouth See admin instructions. Take 80 mg daily and may take an extra 80 mg if needed for fluid 04/20/20  Yes [provider]  levothyroxine (SYNTHROID) 137 MCG tablet Take 137 mcg by mouth daily before breakfast.   Yes [provider]  LORazepam (ATIVAN) 1 MG tablet Take 1 mg by mouth 2 (two) times daily as needed for anxiety.   Yes [provider]  Melatonin 10 MG TABS Take 10 mg by mouth at bedtime as needed (sleep).   Yes [provider]  Nutritional Supplements (NEPRO) LIQD Take 237 mLs by mouth daily. Mixed Berry   Yes [provider]  oxyCODONE-acetaminophen (PERCOCET) 10-325 MG tablet Take 1 tablet by mouth every 6 (six) hours as needed for pain. 02/04/21  Yes [provider]  polyethylene glycol (MIRALAX / GLYCOLAX) 17 g packet Take 17 g by mouth daily. Patient taking differently: Take 17 g by mouth daily as needed for mild constipation. 02/18/20  Yes Lacinda Axon, MD  promethazine (PHENERGAN) 25 MG tablet Take 1 tablet (25 mg total) by mouth 2 (two) times daily as needed for nausea or  vomiting. 01/14/18  Yes Hennie Duos, MD  rosuvastatin (CRESTOR) 10 MG tablet Take 1 tablet (10 mg total) by mouth daily. 10/18/19 03/07/21 Yes [place], Ronnette Juniper, DO  senna (SENOKOT) 8.6 MG TABS tablet Take 2 tablets (17.2 mg total) by mouth daily as needed for mild constipation. 02/18/20  Yes Lacinda Axon, MD  sodium polystyrene (KAYEXALATE) powder Take 15 g by mouth See admin instructions. Take 15 g (mixed in water) by mouth on Saturdays and Sundays Patient taking differently: Take 15 g by mouth See admin instructions. Take 15 g (mixed in water) by mouth on Saturdays and Sundays & Tuesday, Thursday. 01/14/18  Yes Hennie Duos, MD  traZODone (DESYREL) 100 MG tablet Take 1 tablet (100 mg total) by mouth at bedtime. 01/14/18  Yes Hennie Duos, MD  zolpidem (AMBIEN) 10 MG tablet Take 1 tablet (10 mg total) by mouth at bedtime. 01/14/18  Yes Hennie Duos, MD     Critical care time: n/a    Gerlene Fee, DO 03/08/2021, 7:31 AM PGY-3, [place]

## 2021-03-08 NOTE — Progress Notes (Signed)
Lab unable to draw AM blood work.  This RN attempted to and successfully drew AM labs via 22 gauge needle from patients right hand x 1 stick without difficulty.  Pt tolerated well.  Gauze applied and bleeding controlled. ?

## 2021-03-08 NOTE — ED Notes (Addendum)
RT at bedside.

## 2021-03-08 NOTE — Progress Notes (Signed)
Curbsided IR- Reviewed CT imaging and nephrostomy tubes not present. Likely source is femoral catheter.  ? ?Gerlene Fee, DO ?03/08/2021, 11:41 AM ?PGY-3, [place] ? ?

## 2021-03-09 ENCOUNTER — Encounter (HOSPITAL_COMMUNITY): Payer: Self-pay | Admitting: Pulmonary Disease

## 2021-03-09 DIAGNOSIS — A419 Sepsis, unspecified organism: Secondary | ICD-10-CM | POA: Diagnosis not present

## 2021-03-09 DIAGNOSIS — N200 Calculus of kidney: Secondary | ICD-10-CM | POA: Diagnosis not present

## 2021-03-09 DIAGNOSIS — N186 End stage renal disease: Secondary | ICD-10-CM | POA: Diagnosis not present

## 2021-03-09 DIAGNOSIS — I959 Hypotension, unspecified: Secondary | ICD-10-CM | POA: Diagnosis not present

## 2021-03-09 LAB — CBC
HCT: 31.2 % — ABNORMAL LOW (ref 39.0–52.0)
Hemoglobin: 9.6 g/dL — ABNORMAL LOW (ref 13.0–17.0)
MCH: 29.7 pg (ref 26.0–34.0)
MCHC: 30.8 g/dL (ref 30.0–36.0)
MCV: 96.6 fL (ref 80.0–100.0)
Platelets: 68 10*3/uL — ABNORMAL LOW (ref 150–400)
RBC: 3.23 MIL/uL — ABNORMAL LOW (ref 4.22–5.81)
RDW: 16.7 % — ABNORMAL HIGH (ref 11.5–15.5)
WBC: 3.1 10*3/uL — ABNORMAL LOW (ref 4.0–10.5)
nRBC: 0 % (ref 0.0–0.2)

## 2021-03-09 LAB — BASIC METABOLIC PANEL
Anion gap: 12 (ref 5–15)
BUN: 65 mg/dL — ABNORMAL HIGH (ref 6–20)
CO2: 27 mmol/L (ref 22–32)
Calcium: 8.3 mg/dL — ABNORMAL LOW (ref 8.9–10.3)
Chloride: 93 mmol/L — ABNORMAL LOW (ref 98–111)
Creatinine, Ser: 9.85 mg/dL — ABNORMAL HIGH (ref 0.61–1.24)
GFR, Estimated: 6 mL/min — ABNORMAL LOW (ref >60)
Glucose, Bld: 69 mg/dL — ABNORMAL LOW (ref 70–99)
Potassium: 4.8 mmol/L (ref 3.5–5.1)
Sodium: 132 mmol/L — ABNORMAL LOW (ref 135–145)

## 2021-03-09 LAB — HEPATITIS B SURFACE ANTIBODY,QUALITATIVE: Hep B S Ab: REACTIVE — AB

## 2021-03-09 LAB — PATHOLOGIST SMEAR REVIEW: Path Review: NEGATIVE

## 2021-03-09 LAB — HEPATITIS B SURFACE ANTIGEN: Hepatitis B Surface Ag: NONREACTIVE

## 2021-03-09 MED ORDER — HEPARIN SODIUM (PORCINE) 1000 UNIT/ML DIALYSIS: 1000.0000 [IU] | INTRAMUSCULAR | Status: DC | PRN

## 2021-03-09 MED ORDER — SODIUM CHLORIDE 0.9 % IV SOLN: 100.0000 mL | INTRAVENOUS | Status: DC | PRN

## 2021-03-09 MED ORDER — PROSOURCE PLUS PO LIQD
30.0000 mL | Freq: Three times a day (TID) | ORAL | Status: DC
  Administered 2021-03-13 – 2021-03-15 (×3): 30 mL via ORAL
  Filled 2021-03-09 (×15): qty 30

## 2021-03-09 MED ORDER — FENTANYL CITRATE (PF) 100 MCG/2ML IJ SOLN
25.0000 ug | Freq: Once | INTRAMUSCULAR | Status: AC
Start: 2021-03-09 — End: 2021-03-09
  Administered 2021-03-09: 25 ug via INTRAVENOUS
  Filled 2021-03-09: qty 2

## 2021-03-09 MED ORDER — ZOLPIDEM TARTRATE 5 MG PO TABS
10.0000 mg | ORAL_TABLET | Freq: Every evening | ORAL | Status: DC | PRN
  Administered 2021-03-09 – 2021-03-16 (×8): 10 mg via ORAL
  Filled 2021-03-09 (×9): qty 2

## 2021-03-09 MED ORDER — DOXERCALCIFEROL 4 MCG/2ML IV SOLN
3.0000 ug | INTRAVENOUS | Status: DC
  Administered 2021-03-11 – 2021-03-16 (×3): 3 ug via INTRAVENOUS
  Filled 2021-03-09 (×6): qty 2

## 2021-03-09 MED ORDER — PENTAFLUOROPROP-TETRAFLUOROETH EX AERO: 1.0000 "application " | INHALATION_SPRAY | CUTANEOUS | Status: DC | PRN

## 2021-03-09 MED ORDER — LIDOCAINE-PRILOCAINE 2.5-2.5 % EX CREA
1.0000 "application " | TOPICAL_CREAM | CUTANEOUS | Status: DC | PRN
  Filled 2021-03-09: qty 5

## 2021-03-09 MED ORDER — LIDOCAINE HCL (PF) 1 % IJ SOLN: 5.0000 mL | INTRAMUSCULAR | Status: DC | PRN

## 2021-03-09 MED ORDER — ALTEPLASE 2 MG IJ SOLR
2.0000 mg | Freq: Once | INTRAMUSCULAR | Status: DC | PRN
  Filled 2021-03-09 (×2): qty 2

## 2021-03-09 NOTE — Progress Notes (Signed)
Pt receives out-pt HD at [facility] on MWF. Pt has a 5:45 chair time. Will assist as needed.  ? ?Melven Sartorius ?Renal Navigator ?[phone] ?

## 2021-03-09 NOTE — Progress Notes (Signed)
? ?NAME:  [name], MRN:  [id number], DOB:  Oct 31, 1965, LOS: 2 ?ADMISSION DATE:  03/07/2021, CONSULTATION DATE:  03/09/21  ?REFERRING MD:  IMTS , CHIEF COMPLAINT:  flank pain  ? ?History of Present Illness:  ?56 year old man sent to ED from dialysis.  Reportedly hypotensive.  Systolics in the 44R.  He did not feel well.  He received fluid bolus about 1.75 L per ordered fluid boluses in the MAR.  Symptoms improved.  He had persistent hypotension on blood pressure cuff.  He states this is always an accurate given his multiple vascular procedures and poor vasculature.  He says when his systolic is less than 90 he can tell he gets lightheaded, symptomatic.  He feels much improved since the fluid.  He is not tachycardic.  His lactate improved with initial fluid bolus.  ? ?Admission labs notable for hypochloremia and hyponatremia.  Mild elevation in lactic acid 2.4 to decrease to 1.8.  Mild leukopenia WBC 3.4, anemia improved hemoglobin 12.2 from 8.82/22, stable thrombocytopenia.  Chest x-ray 03/07/2021 reviewed and interpreted as clear lungs bilaterally.  CT abdomen pelvis with with IV contrast 03/07/2021 reviewed and notable for chronic renal stones. ? ?Pertinent  Medical History  ?ESRD on HD, hypothyroid, chronic pain, ileostomy in place ? ?Significant Hospital Events: ?Including procedures, antibiotic start and stop dates in addition to other pertinent events   ?3/6 sent to ED from dialysis unit for reported hypotension, febrile in the ED, treated for sepsis with fluid bolus with improved symptoms per patient, started on broad-spectrum antibiotics ?3/7 PCCM consult for ongoing hypotension seems relatively stable since presentation to the ED in the a.m. of 3/6; Bld cx  ? ?Interim History / Subjective:  ? ?Continued to have pain and insomnia. Given fentanyl and Ambien overnight. ? ?AM labs K 4.8, Hgb 9.6 ? ?Objective   ?Blood pressure 110/67, pulse 70, temperature 98.6 ?F (37 ?C), temperature source Oral, resp. rate  15, height 5\' 11"  (1.803 m), weight 123.7 kg, SpO2 95 %. ?   ?   ? ?Intake/Output Summary (Last 24 hours) at 03/09/2021 0739 ?Last data filed at 03/08/2021 2100 ?Gross per 24 hour  ?Intake 820.17 ml  ?Output --  ?Net 820.17 ml  ? ?Filed Weights  ? 03/08/21 0340  ?Weight: 123.7 kg  ? ? ?Examination: ?General: Sleeping comfortably. Opens eyes upon entering room. No acute distress.  ?Cardiac: RRR, blowing systolic murmur at LSB.  ?Respiratory: CTAB, normal effort ?Abdomen: soft, nontender, nondistended ?Extremities: No LE edema ?Skin: Warm and dry, no rashes noted ? ?Resolved Hospital Problem list   ?N/a ? 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  Sent to ED from dialysis due to hypotension. Found to have E. Coli bacteremia on CTX likely source femoral catheter.  ? ?Hypotension 2/2 Severe sepsis  ?E. Coli bacteremia  ?MAP goal >55 and meeting goal. Afebrile ?- Continue midodrine 10 mg TID ?- Continue CTX ?- Follow up bld cx ID panel narrow abx as appropriate ?- Appropriate to transfer out of ICU ? ?Chronic flank pain 2/2 chronic renal stones ?-Percocet 1 tab q6h PRN, oxycodone 5 mg q6h PRN ? ?Hypothyroidism ?-Continue Synthroid ? ?ESRD on HD ? With limited vascular access points.  Last received  dialysis 3/6. Should have HD today. ?- Nephrology Consulted ? ?Severe aortic stenosis ?EF 65-70% GIIDD in 2021. Abnormality of mitral valve and severe aortic stenosis.  ?- Has cardiology outpatient appt scheduled ?-Consider repeat TTE ?-Continue plavix, asa, crestor ? ?Anxiety ?-Continue home lexapro and trazadone ? ?Best Practice (right click and  "Reselect all SmartList Selections" daily)  ? ?Diet/type: Regular consistency (see orders) ?DVT prophylaxis: prophylactic heparin  ?GI prophylaxis: N/A ?Lines: Dialysis Catheter and yes and it is still needed ?Foley:  N/A ?Code Status:  full code ?Last date of multidisciplinary goals of care discussion [n/a, full code confirmed, if he were to lose function of his hands he would not want to go along with aggressive measures] ? ?Labs   ?CBC: ?Recent Labs  ?Lab 03/07/21 ?1125 03/08/21 ?0631 03/09/21 ?0603  ?WBC 3.4* 4.4 3.1*  ?NEUTROABS 3.3 4.1  --   ?HGB 12.2* 10.6* 9.6*  ?HCT 39.8 33.3* 31.2*  ?MCV 99.3 96.5 96.6  ?PLT 108* 75* 68*  ? ? ? ?Basic Metabolic Panel: ?Recent Labs  ?Lab 03/07/21 ?1125 03/08/21 ?0631 03/09/21 ?0603  ?NA 132* 132* 132*  ?K 3.7 5.0 4.8  ?CL 93* 92* 93*  ?CO2 26 26 27   ?GLUCOSE 92 79 69*  ?BUN 28* 45* 65*  ?CREATININE 7.25* 8.60* 9.85*  ?CALCIUM 8.4* 7.5* 8.3*  ? ? ?GFR: ?Estimated Creatinine Clearance: 11.4 mL/min (A) (by C-G formula based on SCr of 9.85 mg/dL (H)). ?Recent Labs  ?Lab 03/07/21 ?1125 03/07/21 ?1525 03/08/21 ?0631 03/09/21 ?0603  ?WBC 3.4*  --  4.4 3.1*  ?LATICACIDVEN 2.4* 1.8  --   --   ? ? ? ?Liver Function Tests: ?Recent Labs  ?Lab 03/07/21 ?1125 03/08/21 ?0631  ?AST 16 26  ?ALT 11 17  ?ALKPHOS 149* 115  ?BILITOT 0.5 0.5  ?PROT 7.3 6.1*  ?ALBUMIN 3.5 2.7*  ? ? ?No results for input(s): LIPASE, AMYLASE in the last 168 hours. ?No results for input(s): AMMONIA in the last 168 hours. ? ?ABG ?   ?Component Value Date/Time  ? PHART 7.488 (H) 01/20/2016 0950  ? PCO2ART 33.9 01/20/2016 0950  ? PO2ART 147 (H) 01/20/2016 0950  ? HCO3 25.4 01/20/2016 0950  ? TCO2 23 02/06/2020 1502  ? O2SAT 99.0 01/20/2016 0950  ? ?  ? ?Coagulation Profile: ?Recent Labs  ?Lab 03/07/21 ?1125 03/08/21 ?0631  ?INR 1.2 1.6*  ? ? ? ?Cardiac Enzymes: ?No results for input(s): CKTOTAL, CKMB, CKMBINDEX, TROPONINI in the last 168 hours. ? ?HbA1C: ?Hgb A1c MFr Bld  ?Date/Time Value Ref Range Status  ?10/17/2019  07:19 AM 4.5 (L) 4.8 - 5.6 % Final  ?  Comment:  ?  (NOTE) ?        Prediabetes: 5.7 - 6.4 ?        Diabetes: >6.4 ?        Glycemic control for adults with diabetes: <7.0 ?  ? ? ?CBG: ?Recent Labs  ?Lab 03/08/21 ?9292  ?GLUCAP 73  ? ? ? ? ?Critical care time: n/a ?  ? ?Gerlene Fee, DO ?03/09/2021, 7:39 AM ?PGY-3, [place] ? ?

## 2021-03-09 NOTE — TOC Progression Note (Deleted)
Transition of Care (TOC) - Progression Note  ? ? ?Patient Details  ?Name: Noble Cicalese ?MRN: [id number] ?Date of Birth: [birth year] ? ?Transition of Care (TOC) CM/SW Contact  ?Angelita Ingles, RN ?Phone Number:[phone] ? ?03/09/2021, 1:20 PM ? ?Clinical Narrative:    ? ?Transition of Care (TOC) Screening Note ? ? ?Patient Details  ?Name: Naftuli Dalsanto ?Date of Birth: 20-May-1965 ? ? ?Transition of Care (TOC) CM/SW Contact:    ?Angelita Ingles, RN ?Phone Number: ?03/09/2021, 1:20 PM ? ? ? ?Transition of Care Department [facility]) has reviewed patient and no TOC needs have been identified at this time. We will continue to monitor patient advancement through interdisciplinary progression rounds. If new patient transition needs arise, please place a TOC consult. ? ? ? ? ?  ?  ? ?Expected Discharge Plan and Services ?  ?  ?  ?  ?  ?                ?  ?  ?  ?  ?  ?  ?  ?  ?  ?  ? ? ?Social Determinants of Health (SDOH) Interventions ?  ? ?Readmission Risk Interventions ?No flowsheet data found. ? ?

## 2021-03-09 NOTE — Consult Note (Signed)
[facility] Renal Consultation Note    Indication for Consultation:  Management of ESRD/hemodialysis; anemia, hypertension/volume and secondary hyperparathyroidism  YIF:OYDXAJ, Shanon Brow, MD  HPI: [name] is a 56 y.o. male with ESRD on HD MWF at [facility]. His past medical history significant for seizure disorder, migraine HA, hyperkalemia, GERD, HTN, recurrent kidney stones, aortic stenosis, spina bifida, anemia, obesity, and anxiety.  Patient presented to the ED after HD (03/07/21) not feeling well; noted ongoing hypotension and fevers. Patient admitted for sepsis work-up. IV fluids were given and started on broad-spectrum ABX. Blood cultures from 3/6 (+) E Coli. He was then transferred to medical ICU for further management. Seen and examined patient at bedside. Appears ill. Denies SOB, CP, and N/V. Patient is currently on RA and noted no pressors were initiated. Labs include WBC 3.1, Lactate trending down-now 1.8, K+ 4.8, SrCr 9.85, BUN 65, and Hgb 9.6. Recent CXR is unremarkable and CT ABD/Pelvis shows no evidence of bowel obstruction or inflammatory process. Plan for HD today per his usual schedule.  Past Medical History:  Diagnosis Date   Anemia    Anxiety    Clotted dialysis access [facility]) 01/06/2020   Constipation    End stage renal failure on dialysis [facility])    [place]; MWF, on HD since 1997, exhausted upper ext access, and possibly LE access; cath dependent in R groin as of 06/2018   GERD (gastroesophageal reflux disease)    Grand mal seizure ([place]) X 4   last 1998 (02/29/2016)   History of blood transfusion 2000s   "I was having blood loss; never found out from where"   History of kidney stones    Hypertension    hx of - has not taken bp meds in over 2 years  since dialysis   Hypothyroidism    Insomnia    Migraine    "due to my BP in my late 1990s; none since" (02/29/2016)   Nonhealing surgical wound    of the left arteriovenous graft   Severe  aortic stenosis    no problems with per pt. - nephrologist and Dr. Coletta Memos   Spina bifida [facility])    Past Surgical History:  Procedure Laterality Date   A/V FISTULAGRAM Right 02/03/2020   Procedure: A/V Fistulagram;  Surgeon: Serafina Mitchell, MD;  Location: [place] CV LAB;  Service: Cardiovascular;  Laterality: Right;   AMPUTATION Left 12/11/2017   Procedure: Left Great Toe Amputation;  Surgeon: [name], MD;  Location: [place];  Service: Orthopedics;  Laterality: Left;   APPENDECTOMY     APPLICATION OF WOUND VAC Left 10/04/2019   Procedure: APPLICATION OF WOUND VAC;  Surgeon: Serafina Mitchell, MD;  Location: [facility] OR;  Service: Vascular;  Laterality: Left;   ARTERIOVENOUS GRAFT PLACEMENT Left 10/16/2012   left femoral goretex graft         Dr Donnetta Hutching   AV FISTULA PLACEMENT Left 06/27/2012   Procedure: EXPLORATORY LEFT THY-GRAFT PSEUDO-ANEURYSM;  Surgeon: Conrad Hometown, MD;  Location: [place];  Service: Vascular;  Laterality: Left;  Revision of left Arteriovenus gortex graft in thigh.   [place] REMOVAL Left 10/04/2019   Procedure: REMOVAL OF ARTERIOVENOUS GORETEX GRAFT ([place]);  Surgeon: Serafina Mitchell, MD;  Location: [place];  Service: Vascular;  Laterality: Left;   COLONOSCOPY     DRESSING CHANGE UNDER ANESTHESIA Left 10/07/2019   Procedure: DRESSING CHANGE UNDER ANESTHESIA;  Surgeon: Meredith Pel, MD;  Location: [place];  Service: Orthopedics;  Laterality: Left;   EXCHANGE OF A DIALYSIS CATHETER Right 02/06/2020   Procedure: EXCHANGE OF TUNNELED DIALYSIS CATHETER;  Surgeon: Serafina Mitchell, MD;  Location: [place];  Service: Vascular;  Laterality: Right;   FLEXIBLE SIGMOIDOSCOPY N/A 12/03/2018   Procedure: FLEXIBLE SIGMOIDOSCOPY;  Surgeon: Yetta Flock, MD;  Location: [place];  Service: Gastroenterology;  Laterality: N/A;   I & D EXTREMITY Left 02/29/2016   Procedure: DEBRIDEMENT LEFT THIGH WOUND;  Surgeon: Waynetta Sandy, MD;  Location: [place];  Service: Vascular;  Laterality:  Left;   I & D EXTREMITY Left 10/04/2019   Procedure: IRRIGATION AND DEBRIDEMENT LEFT THIGH;  Surgeon: Serafina Mitchell, MD;  Location: [place];  Service: Vascular;  Laterality: Left;   I & D EXTREMITY Right 12/09/2019   Procedure: IRRIGATION AND DEBRIDEMENT EXTREMITY;  Surgeon: Altamese Houma, MD;  Location: [place];  Service: Orthopedics;  Laterality: Right;   ILEOSTOMY  1970s   INCISION AND DRAINAGE Left 10/16/2012   Procedure: INCISION AND Debridement left thigh graft;  Surgeon: Rosetta Posner, MD;  Location: [place];  Service: Vascular;  Laterality: Left;   INSERTION OF DIALYSIS CATHETER     INSERTION OF DIALYSIS CATHETER  01/14/2016   Procedure: INSERTION OF DIALYSIS CATHETER;  Surgeon: Waynetta Sandy, MD;  Location: [place];  Service: Vascular;;   INSERTION OF DIALYSIS CATHETER Right 08/07/2017   Procedure: INSERTION OF DIALYSIS CATHETER;  Surgeon: Waynetta Sandy, MD;  Location: [place];  Service: Vascular;  Laterality: Right;   INSERTION OF DIALYSIS CATHETER Right 12/21/2017   Procedure: RIGHT FEMORAL DIALYSIS CATHETER EXCHANGE;  Surgeon: Marty Heck, MD;  Location: [place];  Service: Vascular;  Laterality: Right;   INSERTION OF ILIAC STENT Left 01/14/2016   Procedure: INSERTION OF ILIAC STENT;  Surgeon: Waynetta Sandy, MD;  Location: [place];  Service: Vascular;  Laterality: Left;   INTRAOPERATIVE ARTERIOGRAM Left 01/14/2016   Procedure: INTRA OPERATIVE ARTERIOGRAM;  Surgeon: Waynetta Sandy, MD;  Location: [place];  Service: Vascular;  Laterality: Left;   IR NEPHROSTOMY EXCHANGE LEFT  03/21/2019   IR NEPHROSTOMY PLACEMENT LEFT  11/28/2018   IRRIGATION AND DEBRIDEMENT EXTREMITY (Right   12/09/2019   KNEE ARTHROSCOPY Left 10/07/2019   Procedure: ARTHROSCOPY KNEE w/ STIMULATING  BEAD PLACMENT;  Surgeon: Meredith Pel, MD;  Location: [place];  Service: Orthopedics;  Laterality: Left;   LEFT HEART CATH AND CORONARY ANGIOGRAPHY N/A 10/17/2019   Procedure: LEFT  HEART CATH AND CORONARY ANGIOGRAPHY;  Surgeon: Jettie Booze, MD;  Location: [place] CV LAB;  Service: Cardiovascular;  Laterality: N/A;   LITHOTRIPSY  x 3   "& a laser treatment" (02/29/2016)   NEPHROLITHOTOMY Left 03/26/2019   Procedure: NEPHROLITHOTOMY PERCUTANEOUS;  Surgeon: Alexis Frock, MD;  Location: [facility];  Service: Urology;  Laterality: Left;  2 HRS   NEPHROSTOMY Bilateral 1968   PATELLAR TENDON REPAIR Right 1990s   "big incision"   PERIPHERAL VASCULAR CATHETERIZATION  01/14/2016   Procedure: A/V SHUNTOGRAM;  Surgeon: Waynetta Sandy, MD;  Location: [place];  Service: Vascular;;   REMOVAL OF GRAFT Left 01/30/2020   Procedure: REMOVAL OF INFECTED AV GRAFT LEFT THIGH;  Surgeon: Serafina Mitchell, MD;  Location: [place];  Service: Vascular;  Laterality: Left;   REVISION OF ARTERIOVENOUS GORETEX GRAFT Left 10/16/2012   Procedure: REVISION OF LEFT FEMORAL LOOP ARTERIOVENOUS GORETEX GRAFT;  Surgeon: Rosetta Posner, MD;  Location: [place];  Service: Vascular;  Laterality: Left;  REVISION OF ARTERIOVENOUS GORETEX GRAFT Left 02/11/2015   Procedure: EXCISION OF SMALL SEGMENT OF EXPOSED LEFT THIGH NON FUNCTIONING  ARTERIOVENOUS GORETEX GRAFT;  Surgeon: Mal Misty, MD;  Location: [place];  Service: Vascular;  Laterality: Left;   REVISION OF ARTERIOVENOUS GORETEX GRAFT Left 01/14/2016   Procedure: REVISION OF ARTERIOVENOUS GORETEX GRAFT;  Surgeon: Waynetta Sandy, MD;  Location: [place];  Service: Vascular;  Laterality: Left;   REVISION OF ARTERIOVENOUS GORETEX GRAFT Left 02/29/2016   thigh/pt report   REVISION OF ARTERIOVENOUS GORETEX GRAFT Left 02/29/2016   Procedure: POSSIBLE REVISION OF LEFT THIGH ARTERIOVENOUS GORETEX GRAFT;  Surgeon: Waynetta Sandy, MD;  Location: [place];  Service: Vascular;  Laterality: Left;   TEE WITHOUT CARDIOVERSION N/A 12/14/2017   Procedure: TRANSESOPHAGEAL ECHOCARDIOGRAM (TEE);  Surgeon: Acie Fredrickson, Wonda Cheng, MD;  Location: [place];   Service: Cardiovascular;  Laterality: N/A;   THROMBECTOMY AND REVISION OF ARTERIOVENTOUS (AV) GORETEX  GRAFT Left 06/12/2018   Procedure: Removal of infected left thigh graft;  Surgeon: Rosetta Posner, MD;  Location: [place];  Service: Vascular;  Laterality: Left;   THROMBECTOMY W/ EMBOLECTOMY Left 01/14/2016   Procedure: THROMBECTOMY ARTERIOVENOUS GORE-TEX Left thigh GRAFT;  Surgeon: Waynetta Sandy, MD;  Location: [place];  Service: Vascular;  Laterality: Left;   TONGUE SURGERY  ~ 1990   tongue-tie release    ULTRASOUND GUIDANCE FOR VASCULAR ACCESS Right 08/07/2017   Procedure: ULTRASOUND GUIDANCE FOR VASCULAR CANNULATION RIGHT FEMORAL VEIN AND LEFT AV FEMORAL GRAFT;  Surgeon: Waynetta Sandy, MD;  Location: [place];  Service: Vascular;  Laterality: Right;   UPPER EXTREMITY VENOGRAPHY Bilateral 08/09/2017   Procedure: UPPER EXTREMITY VENOGRAPHY;  Surgeon: Serafina Mitchell, MD;  Location: [place] CV LAB;  Service: Cardiovascular;  Laterality: Bilateral;   VENOGRAM N/A 09/20/2017   Procedure: RIGHT COMMON FEMORAL ARTERY EXPLORATION.  CANNULATION RIGHT COMMON FEMORAL VEIN. VENOGRAM CENTRAL ULTRA SOUND GUIDED RIGHT FEMORAL VEIN TIMES TWO.;  Surgeon: Waynetta Sandy, MD;  Location: [facility] OR;  Service: Vascular;  Laterality: N/A;   VENOGRAM Right 10/04/2019   Procedure: VENOGRAM;  Surgeon: Serafina Mitchell, MD;  Location: [facility] OR;  Service: Vascular;  Laterality: Right;   WOUND DEBRIDEMENT Left 02/29/2016   thigh   Family History  Problem Relation Age of Onset   Diabetes Mother    Hypertension Mother    Heart disease Mother        before age 29   Diabetes Father    Heart attack Father        X's 3   Diabetes Sister    Bipolar disorder Sister    Social History:  reports that he has never smoked. He has never used smokeless tobacco. He reports that he does not currently use alcohol. He reports that he does not currently use drugs after having used the following drugs:  Marijuana. Allergies  Allergen Reactions   Hydrocodone Other (See Comments)    Caused involuntary movement and twitching. CANNOT TAKE DUE TO MUSCLE SPASMS AND MUSCLE TREMORS Tolerates hydromorphone and oxycodone.   Furadantin [Nitrofurantoin] Other (See Comments)    UNSPECIFIED REACTION    Mandelamine [Methenamine] Other (See Comments)    UNSPECIFIED REACTION    Noroxin [Norfloxacin] Other (See Comments)    UNSPECIFIED REACTION    Colace [Docusate] Diarrhea and Nausea And Vomiting   Elemental Sulfur Cough    Childhood reaction - pt could not confirm that it was a cough   Sulfa Antibiotics Other (See Comments) and  Cough    Childhood reaction - pt could not confirm that it was a cough   Prior to Admission medications   Medication Sig Start Date End Date Taking? Authorizing Provider  acetaminophen (TYLENOL) 500 MG tablet Take 1,000 mg by mouth every 6 (six) hours as needed for mild pain or headache.   Yes [provider]  aspirin EC 325 MG tablet Take 325 mg by mouth daily.   Yes [provider]  calcium acetate (PHOSLO) 667 MG capsule Take 3-4 capsules (2,001-2,668 mg total) by mouth See admin instructions. Take 2708 mg by mouth 3 times daily with meals, take 2001 mg by mouth with snacks Patient taking differently: Take 2,668 mg by mouth 3 (three) times daily with meals. 01/14/18  Yes Hennie Duos, MD  cinacalcet [facility]) 30 MG tablet Take 1 tablet (30 mg total) by mouth every Monday, Wednesday, and Friday with hemodialysis. 02/18/20  Yes Lacinda Axon, MD  clopidogrel (PLAVIX) 75 MG tablet Take 75 mg by mouth daily.   Yes [provider]  diclofenac Sodium (VOLTAREN) 1 % GEL Apply 2 g topically daily as needed (For leg pain).   Yes [provider]  doxercalciferol (HECTOROL) 4 MCG/2ML injection Inject 2 mLs (4 mcg total) into the vein every Monday, Wednesday, and Friday with hemodialysis. 02/18/20  Yes Lacinda Axon, MD  escitalopram  (LEXAPRO) 5 MG tablet Take 5 mg by mouth daily.   Yes [provider]  famotidine (PEPCID) 40 MG tablet Take 40 mg by mouth daily. 09/01/10  Yes [provider]  fluticasone (FLONASE) 50 MCG/ACT nasal spray Place 2 sprays into both nostrils as needed for allergies or rhinitis (congestion). Patient taking differently: Place 2 sprays into both nostrils daily as needed for allergies or rhinitis (congestion). 01/14/18  Yes Hennie Duos, MD  furosemide (LASIX) 80 MG tablet Take 80-160 mg by mouth See admin instructions. Take 80 mg daily and may take an extra 80 mg if needed for fluid 04/20/20  Yes [provider]  levothyroxine (SYNTHROID) 137 MCG tablet Take 137 mcg by mouth daily before breakfast.   Yes [provider]  LORazepam (ATIVAN) 1 MG tablet Take 1 mg by mouth 2 (two) times daily as needed for anxiety.   Yes [provider]  Melatonin 10 MG TABS Take 10 mg by mouth at bedtime as needed (sleep).   Yes [provider]  Nutritional Supplements (NEPRO) LIQD Take 237 mLs by mouth daily. Mixed Berry   Yes [provider]  oxyCODONE-acetaminophen (PERCOCET) 10-325 MG tablet Take 1 tablet by mouth every 6 (six) hours as needed for pain. 02/04/21  Yes [provider]  polyethylene glycol (MIRALAX / GLYCOLAX) 17 g packet Take 17 g by mouth daily. Patient taking differently: Take 17 g by mouth daily as needed for mild constipation. 02/18/20  Yes Lacinda Axon, MD  promethazine (PHENERGAN) 25 MG tablet Take 1 tablet (25 mg total) by mouth 2 (two) times daily as needed for nausea or vomiting. 01/14/18  Yes Hennie Duos, MD  rosuvastatin (CRESTOR) 10 MG tablet Take 1 tablet (10 mg total) by mouth daily. 10/18/19 03/07/21 Yes Brimage, Ronnette Juniper, DO  senna (SENOKOT) 8.6 MG TABS tablet Take 2 tablets (17.2 mg total) by mouth daily as needed for mild constipation. 02/18/20  Yes Lacinda Axon, MD  sodium polystyrene (KAYEXALATE)  powder Take 15 g by mouth See admin instructions. Take 15 g (mixed in water) by mouth on Saturdays and  Sundays Patient taking differently: Take 15 g by mouth See admin instructions. Take 15 g (mixed in water) by mouth on Saturdays and Sundays & Tuesday, Thursday. 01/14/18  Yes Hennie Duos, MD  traZODone (DESYREL) 100 MG tablet Take 1 tablet (100 mg total) by mouth at bedtime. 01/14/18  Yes Hennie Duos, MD  zolpidem (AMBIEN) 10 MG tablet Take 1 tablet (10 mg total) by mouth at bedtime. 01/14/18  Yes Hennie Duos, MD   Current Facility-Administered Medications  Medication Dose Route Frequency Provider Last Rate Last Admin   0.9 %  sodium chloride infusion   Intra-arterial PRN Hunsucker, Bonna Gains, MD   Held at 03/08/21 0400   0.9 %  sodium chloride infusion  100 mL Intravenous PRN Adelfa Koh, NP       0.9 %  sodium chloride infusion  100 mL Intravenous PRN Adelfa Koh, NP       acetaminophen (TYLENOL) tablet 1,000 mg  1,000 mg Oral TID Hunsucker, Bonna Gains, MD   1,000 mg at 03/09/21 1006   alteplase (CATHFLO ACTIVASE) injection 2 mg  2 mg Intracatheter Once PRN Adelfa Koh, NP       aspirin EC tablet 81 mg  81 mg Oral Daily Autry-Lott, Simone, DO   81 mg at 03/09/21 1007   cefTRIAXone (ROCEPHIN) 2 g in sodium chloride 0.9 % 100 mL IVPB  2 g Intravenous Q24H Jacky Kindle, MD   Stopped at 03/09/21 1046   Chlorhexidine Gluconate Cloth 2 % PADS 6 each  6 each Topical Q0600 Hunsucker, Bonna Gains, MD   6 each at 03/08/21 0341   clopidogrel (PLAVIX) tablet 75 mg  75 mg Oral Daily Hunsucker, Bonna Gains, MD   75 mg at 03/08/21 1639   diclofenac Sodium (VOLTAREN) 1 % topical gel 2 g  2 g Topical Daily PRN Hunsucker, Bonna Gains, MD       escitalopram (LEXAPRO) tablet 10 mg  10 mg Oral Daily Hunsucker, Bonna Gains, MD   10 mg at 03/09/21 1006   famotidine (PEPCID) tablet 40 mg  40 mg Oral Daily Hunsucker, Bonna Gains, MD   40 mg at 03/09/21 1007   heparin injection 1,000  Units  1,000 Units Dialysis PRN Adelfa Koh, NP       heparin injection 5,000 Units  5,000 Units Subcutaneous Q8H Hunsucker, Bonna Gains, MD   5,000 Units at 03/09/21 0003   levothyroxine (SYNTHROID) tablet 137 mcg  137 mcg Oral QAC breakfast Hunsucker, Bonna Gains, MD   137 mcg at 03/09/21 1136   lidocaine (PF) (XYLOCAINE) 1 % injection 5 mL  5 mL Intradermal PRN Adelfa Koh, NP       lidocaine-prilocaine (EMLA) cream 1 application.  1 application. Topical PRN Adelfa Koh, NP       LORazepam (ATIVAN) tablet 1 mg  1 mg Oral BID PRN Hunsucker, Bonna Gains, MD   1 mg at 03/09/21 0006   midodrine (PROAMATINE) tablet 10 mg  10 mg Oral TID WC Jacky Kindle, MD   10 mg at 03/09/21 1136   oxyCODONE-acetaminophen (PERCOCET/ROXICET) 5-325 MG per tablet 1 tablet  1 tablet Oral Q6H PRN Hunsucker, Bonna Gains, MD   1 tablet at 03/09/21 0005   And   oxyCODONE (Oxy IR/ROXICODONE) immediate release tablet 5 mg  5 mg Oral Q6H PRN Hunsucker, Bonna Gains, MD   5 mg at 03/09/21 0006   pentafluoroprop-tetrafluoroeth (GEBAUERS) aerosol 1 application.  1 application. Topical PRN Ouida Sills,  Lawson Radar, NP       rosuvastatin (CRESTOR) tablet 10 mg  10 mg Oral Daily Hunsucker, Bonna Gains, MD   10 mg at 03/09/21 1006   traZODone (DESYREL) tablet 100 mg  100 mg Oral QHS Hunsucker, Bonna Gains, MD   100 mg at 03/09/21 0004   zolpidem (AMBIEN) tablet 10 mg  10 mg Oral QHS PRN Jacky Kindle, MD       Labs: Basic Metabolic Panel: Recent Labs  Lab 03/07/21 1125 03/08/21 0631 03/09/21 0603  NA 132* 132* 132*  K 3.7 5.0 4.8  CL 93* 92* 93*  CO2 26 26 27   GLUCOSE 92 79 69*  BUN 28* 45* 65*  CREATININE 7.25* 8.60* 9.85*  CALCIUM 8.4* 7.5* 8.3*   Liver Function Tests: Recent Labs  Lab 03/07/21 1125 03/08/21 0631  AST 16 26  ALT 11 17  ALKPHOS 149* 115  BILITOT 0.5 0.5  PROT 7.3 6.1*  ALBUMIN 3.5 2.7*   No results for input(s): LIPASE, AMYLASE in the last 168 hours. No results for input(s):  AMMONIA in the last 168 hours. CBC: Recent Labs  Lab 03/07/21 1125 03/08/21 0631 03/09/21 0603  WBC 3.4* 4.4 3.1*  NEUTROABS 3.3 4.1  --   HGB 12.2* 10.6* 9.6*  HCT 39.8 33.3* 31.2*  MCV 99.3 96.5 96.6  PLT 108* 75* 68*   Cardiac Enzymes: No results for input(s): CKTOTAL, CKMB, CKMBINDEX, TROPONINI in the last 168 hours. CBG: Recent Labs  Lab 03/08/21 0348  GLUCAP 73   Iron Studies: No results for input(s): IRON, TIBC, TRANSFERRIN, FERRITIN in the last 72 hours. Studies/Results: No results found.  Physical Exam: Vitals:   03/09/21 0900 03/09/21 1000 03/09/21 1105 03/09/21 1200  BP: (!) 90/47 (!) 100/57  (!) 95/48  Pulse: 64 64  62  Resp: 16 16  14   Temp:   98 F (36.7 C)   TempSrc:   Oral   SpO2: 91% 94%  91%  Weight:      Height:         General: WDWN NAD Head: NCAT sclera not icteric Lungs: CTA bilaterally. No wheeze, rales or rhonchi. Breathing is unlabored. Heart: RRR. No murmur, rubs or gallops.  Abdomen: soft, nontender, +BS, ileostomy in place Lower extremities: no edema, ischemic changes, or open wounds  Neuro: AAOx3. Moves all extremities spontaneously. Dialysis Access: Thigh TDC (catheter dependent)  Dialysis Orders:  MWF - [place] [facility] 4hrs69min, BFR 350, DFR 800,  EDW 120kg, 2K/ 2Ca Heparin 3700 unit bolus No ESA-last Hgb 12.2 in OP HD center Hectorol 69mcg IV qHD-last 03/07/21 Sensipar 90mg  with HD-last 03/07/21 Phoslo 667mg  4 capsules with meals and 3 with snacks  Assessment/Plan: E Coli Bacteremia-on IV Ceftriaxone. Manage by critical care service: considering TTE. ESRD - on HD. HD today per usual schedule. Keep SBP > 85. Use IV Albumin PRN. UF as tolerated Hypertension/volume  - No evidence of volume overload. Bps soft/stable. Seems to be over EDW by 2-3kg. Plan for UFG 1-2L today if BP tolerates. Anemia of CKD - now 9.6, was 12 in outpatient. Will start ESA here. Hold Fe for now given current infection Secondary  Hyperparathyroidism - Ca 8.3. Will resume Hectorol with HD. Will check PO4 in AM Nutrition - Renal diet with fluid restriction. Will start protein supplements  Tobie Poet, NP [facility] 03/09/2021, 1:49 PM

## 2021-03-09 NOTE — TOC Progression Note (Signed)
Transition of Care (TOC) - Progression Note  ? ? ?Patient Details  ?Name: [name] ?MRN: [id number] ?Date of Birth: [birth year] ? ?Transition of Care (TOC) CM/SW Contact  ?Angelita Ingles, RN ?Phone Number:[phone] ? ?03/09/2021, 1:14 PM ? ?Clinical Narrative:    ? ?Transition of Care (TOC) Screening Note ? ? ?Patient Details  ?Name: [name] ?Date of Birth: 1965-07-07 ? ? ?Transition of Care (TOC) CM/SW Contact:    ?Angelita Ingles, RN ?Phone Number: ?03/09/2021, 1:14 PM ? ? ? ?Transition of Care Department [facility]) has reviewed patient and no TOC needs have been identified at this time. We will continue to monitor patient advancement through interdisciplinary progression rounds.  ? ? ? ? ?  ?  ? ?Expected Discharge Plan and Services ?  ?  ?  ?  ?  ?                ?  ?  ?  ?  ?  ?  ?  ?  ?  ?  ? ? ?Social Determinants of Health (SDOH) Interventions ?  ? ?Readmission Risk Interventions ?No flowsheet data found. ? ?

## 2021-03-10 LAB — CBC WITH DIFFERENTIAL/PLATELET
Abs Immature Granulocytes: 0.03 10*3/uL (ref 0.00–0.07)
Basophils Absolute: 0 10*3/uL (ref 0.0–0.1)
Basophils Relative: 0 %
Eosinophils Absolute: 0.2 10*3/uL (ref 0.0–0.5)
Eosinophils Relative: 7 %
HCT: 32.9 % — ABNORMAL LOW (ref 39.0–52.0)
Hemoglobin: 10.1 g/dL — ABNORMAL LOW (ref 13.0–17.0)
Immature Granulocytes: 1 %
Lymphocytes Relative: 10 %
Lymphs Abs: 0.3 10*3/uL — ABNORMAL LOW (ref 0.7–4.0)
MCH: 29.9 pg (ref 26.0–34.0)
MCHC: 30.7 g/dL (ref 30.0–36.0)
MCV: 97.3 fL (ref 80.0–100.0)
Monocytes Absolute: 0.3 10*3/uL (ref 0.1–1.0)
Monocytes Relative: 11 %
Neutro Abs: 1.9 10*3/uL (ref 1.7–7.7)
Neutrophils Relative %: 71 %
Platelets: 67 10*3/uL — ABNORMAL LOW (ref 150–400)
RBC: 3.38 MIL/uL — ABNORMAL LOW (ref 4.22–5.81)
RDW: 16.9 % — ABNORMAL HIGH (ref 11.5–15.5)
WBC: 2.7 10*3/uL — ABNORMAL LOW (ref 4.0–10.5)
nRBC: 0 % (ref 0.0–0.2)

## 2021-03-10 LAB — RENAL FUNCTION PANEL
Albumin: 2.9 g/dL — ABNORMAL LOW (ref 3.5–5.0)
Anion gap: 16 — ABNORMAL HIGH (ref 5–15)
BUN: 82 mg/dL — ABNORMAL HIGH (ref 6–20)
CO2: 25 mmol/L (ref 22–32)
Calcium: 8.3 mg/dL — ABNORMAL LOW (ref 8.9–10.3)
Chloride: 95 mmol/L — ABNORMAL LOW (ref 98–111)
Creatinine, Ser: 11.43 mg/dL — ABNORMAL HIGH (ref 0.61–1.24)
GFR, Estimated: 5 mL/min — ABNORMAL LOW (ref >60)
Glucose, Bld: 82 mg/dL (ref 70–99)
Phosphorus: 6 mg/dL — ABNORMAL HIGH (ref 2.5–4.6)
Potassium: 6 mmol/L — ABNORMAL HIGH (ref 3.5–5.1)
Sodium: 136 mmol/L (ref 135–145)

## 2021-03-10 LAB — HEPATITIS B SURFACE ANTIBODY, QUANTITATIVE: Hep B S AB Quant (Post): 102.5 m[IU]/mL (ref >9.9)

## 2021-03-10 MED ORDER — HYDROMORPHONE HCL 1 MG/ML IJ SOLN
0.5000 mg | Freq: Four times a day (QID) | INTRAMUSCULAR | Status: DC | PRN
  Administered 2021-03-10: 0.5 mg via INTRAVENOUS
  Filled 2021-03-10: qty 1

## 2021-03-10 MED ORDER — ALTEPLASE 2 MG IJ SOLR
INTRAMUSCULAR | Status: AC
  Administered 2021-03-10: 4 mg
  Filled 2021-03-10: qty 2

## 2021-03-10 MED ORDER — SODIUM ZIRCONIUM CYCLOSILICATE 10 G PO PACK
10.0000 g | PACK | Freq: Once | ORAL | Status: AC
  Administered 2021-03-10: 10 g via ORAL
  Filled 2021-03-10: qty 1

## 2021-03-10 MED ORDER — HYDROMORPHONE HCL 1 MG/ML IJ SOLN
0.5000 mg | INTRAMUSCULAR | Status: DC | PRN
  Administered 2021-03-10 – 2021-03-16 (×24): 0.5 mg via INTRAVENOUS
  Filled 2021-03-10 (×7): qty 1
  Filled 2021-03-10: qty 0.5
  Filled 2021-03-10 (×18): qty 1

## 2021-03-10 MED ORDER — OXYCODONE HCL 5 MG PO TABS: 5.0000 mg | ORAL_TABLET | Freq: Four times a day (QID) | ORAL | Status: DC | PRN

## 2021-03-10 MED ORDER — ONDANSETRON HCL 4 MG/2ML IJ SOLN
4.0000 mg | Freq: Once | INTRAMUSCULAR | Status: AC | PRN
  Administered 2021-03-10: 4 mg via INTRAVENOUS
  Filled 2021-03-10: qty 2

## 2021-03-10 MED ORDER — CHLORHEXIDINE GLUCONATE CLOTH 2 % EX PADS
6.0000 | MEDICATED_PAD | Freq: Every day | CUTANEOUS | Status: DC
  Administered 2021-03-10 – 2021-03-17 (×8): 6 via TOPICAL

## 2021-03-10 MED ORDER — OXYCODONE-ACETAMINOPHEN 5-325 MG PO TABS
2.0000 | ORAL_TABLET | Freq: Four times a day (QID) | ORAL | Status: DC | PRN
  Administered 2021-03-10 – 2021-03-17 (×9): 2 via ORAL
  Filled 2021-03-10 (×9): qty 2

## 2021-03-10 MED ORDER — SODIUM CHLORIDE 0.9 % IV SOLN
12.5000 mg | INTRAVENOUS | Status: AC
  Administered 2021-03-10 – 2021-03-14 (×3): 12.5 mg via INTRAVENOUS
  Filled 2021-03-10 (×6): qty 0.5

## 2021-03-10 MED ORDER — OXYCODONE-ACETAMINOPHEN 5-325 MG PO TABS: 1.0000 | ORAL_TABLET | Freq: Four times a day (QID) | ORAL | Status: DC | PRN

## 2021-03-10 MED ORDER — MIDODRINE HCL 5 MG PO TABS
5.0000 mg | ORAL_TABLET | Freq: Three times a day (TID) | ORAL | Status: DC
Start: 2021-03-10 — End: 2021-03-12
  Administered 2021-03-10 – 2021-03-12 (×6): 5 mg via ORAL
  Filled 2021-03-10 (×6): qty 1

## 2021-03-10 NOTE — Plan of Care (Signed)
?  Problem: Education: ?Goal: Knowledge of disease and its progression will improve ?Outcome: Completed/Met ?  ?

## 2021-03-10 NOTE — Evaluation (Signed)
Physical Therapy Evaluation ?Patient Details ?Name: [name] ?MRN: [id number] ?DOB: Apr 25, 1965 ?Today's Date: 03/10/2021 ? ?History of Present Illness ? 56 y/o male presented to ED on 03/07/21 from dialysis for nausea, R flank pain, and hypotension. Admitted for sepsis workup. Found to have +E Coli. CT abdomen negative. CXR negative. PMH: ESRD, seizure disorder, HTN, aortic stenosis, spina bifida, anemia, obesity, anxiety  ?Clinical Impression ? Patient admitted with above diagnosis. Patient presents with generalized weakness, decreased activity tolerance, impaired balance, and pain. Patient agreeable to limited mobility this session. Performed sit to stand from extremely elevated surface with min guard. Patient ambulated 8' with RW and min guard for safety. Patient has leg length discrepancy at baseline (R>L). Patient lives with cousin and is modI at baseline with use of RW and wheelchair for mobility. Patient will benefit from skilled PT services during acute stay to address listed deficits. Recommend HHPT at discharge to maximize functional independence and strengthening.    ?   ? ?Recommendations for follow up therapy are one component of a multi-disciplinary discharge planning process, led by the attending physician.  Recommendations may be updated based on patient status, additional functional criteria and insurance authorization. ? ?Follow Up Recommendations Home health PT ? ?  ?Assistance Recommended at Discharge Set up Supervision/Assistance  ?Patient can return home with the following ?   ? ?  ?Equipment Recommendations None recommended by PT (patient owns necessary equipment)  ?Recommendations for Other Services ?    ?  ?Functional Status Assessment Patient has had a recent decline in their functional status and demonstrates the ability to make significant improvements in function in a reasonable and predictable amount of time.  ? ?  ?Precautions / Restrictions Precautions ?Precautions:  Fall ?Precaution Comments: R femoral HD catheter ?Restrictions ?Weight Bearing Restrictions: No  ? ?  ? ?Mobility ? Bed Mobility ?Overal bed mobility: Needs Assistance ?Bed Mobility: Rolling, Sidelying to Sit, Sit to Sidelying ?Rolling: Supervision ?Sidelying to sit: Supervision ?  ?  ?Sit to sidelying: Supervision ?General bed mobility comments: supervision for safety ?  ? ?Transfers ?Overall transfer level: Needs assistance ?Equipment used: Rolling Lebron Nauert (2 wheels) ?Transfers: Sit to/from Stand ?Sit to Stand: Min guard, From elevated surface ?  ?  ?  ?  ?  ?General transfer comment: min guard from extremely elevated surface. patient reporting he has to stand such a high surface due to avoiding kinking femoral catheter ?  ? ?Ambulation/Gait ?Ambulation/Gait assistance: Min guard ?Gait Distance (Feet): 8 Feet ?Assistive device: Rolling Brock Mokry (2 wheels) ?Gait Pattern/deviations: Step-to pattern, Antalgic ?Gait velocity: decreased ?  ?  ?General Gait Details: R LE longer than LLE so demonstrates waddle gait. Min guard for safety ? ?Stairs ?  ?  ?  ?  ?  ? ?Wheelchair Mobility ?  ? ?Modified Rankin (Stroke Patients Only) ?  ? ?  ? ?Balance Overall balance assessment: Needs assistance ?Sitting-balance support: No upper extremity supported, Feet supported ?Sitting balance-Leahy Scale: Fair ?  ?  ?Standing balance support: Bilateral upper extremity supported, Reliant on assistive device for balance ?Standing balance-Leahy Scale: Poor ?Standing balance comment: reliant on UE support ?  ?  ?  ?  ?  ?  ?  ?  ?  ?  ?  ?   ? ? ? ?Pertinent Vitals/Pain Pain Assessment ?Pain Assessment: 0-10 ?Pain Score: 9  ?Pain Location: abdomen ?Pain Descriptors / Indicators: Aching, Discomfort, Grimacing, Guarding, Sore ?Pain Intervention(s): Monitored during session, Repositioned, Patient requesting pain meds-RN notified  ? ? ?  Home Living Family/patient expects to be discharged to:: Private residence ?Living Arrangements: Other  relatives ?Available Help at Discharge: Family ?Type of Home: House ?Home Access: Stairs to enter ?Entrance Stairs-Rails: None ?Entrance Stairs-Number of Steps: 1 - threshold ?  ?Home Layout: One level ?Home Equipment: Conservation officer, nature (2 wheels);Cane - single point;Wheelchair - manual;Wheelchair - power (bariatric RW) ?   ?  ?Prior Function Prior Level of Function : Independent/Modified Independent ?  ?  ?  ?  ?  ?  ?Mobility Comments: uses bariatric RW for mobility and utilizes w/c to attend HD ?ADLs Comments: sponges bathes at baseline due to femoral HD catheter ?  ? ? ?Hand Dominance  ?   ? ?  ?Extremity/Trunk Assessment  ? Upper Extremity Assessment ?Upper Extremity Assessment: Defer to OT evaluation ?  ? ?Lower Extremity Assessment ?Lower Extremity Assessment: Generalized weakness (R LE longer than L LE) ?  ? ?Cervical / Trunk Assessment ?Cervical / Trunk Assessment: Kyphotic  ?Communication  ? Communication: No difficulties  ?Cognition Arousal/Alertness: Awake/alert ?Behavior During Therapy: [facility] for tasks assessed/performed ?Overall Cognitive Status: Within Functional Limits for tasks assessed ?  ?  ?  ?  ?  ?  ?  ?  ?  ?  ?  ?  ?  ?  ?  ?  ?  ?  ?  ? ?  ?General Comments   ? ?  ?Exercises    ? ?Assessment/Plan  ?  ?PT Assessment Patient needs continued PT services  ?PT Problem List Decreased strength;Decreased activity tolerance;Decreased balance;Decreased mobility;Decreased coordination;Decreased safety awareness ? ?   ?  ?PT Treatment Interventions DME instruction;Gait training;Balance training;Functional mobility training;Therapeutic exercise;Therapeutic activities;Patient/family education   ? ?PT Goals (Current goals can be found in the Care Plan section)  ?Acute Rehab PT Goals ?Patient Stated Goal: to reduce pain and go home ?PT Goal Formulation: With patient ?Time For Goal Achievement: 03/24/21 ?Potential to Achieve Goals: Good ? ?  ?Frequency Min 3X/week ?  ? ? ?Co-evaluation   ?  ?  ?  ?  ? ? ?   ?AM-PAC PT "6 Clicks" Mobility  ?Outcome Measure Help needed turning from your back to your side while in a flat bed without using bedrails?: A Little ?Help needed moving from lying on your back to sitting on the side of a flat bed without using bedrails?: A Little ?Help needed moving to and from a bed to a chair (including a wheelchair)?: A Little ?Help needed standing up from a chair using your arms (e.g., wheelchair or bedside chair)?: A Little ?Help needed to walk in hospital room?: A Little ?Help needed climbing 3-5 steps with a railing? : A Little ?6 Click Score: 18 ? ?  ?End of Session   ?Activity Tolerance: Patient tolerated treatment well ?Patient left: in bed;with call bell/phone within reach;with nursing/sitter in room ?Nurse Communication: Mobility status ?PT Visit Diagnosis: Unsteadiness on feet (R26.81);Muscle weakness (generalized) (M62.81);Difficulty in walking, not elsewhere classified (R26.2) ?  ? ?Time: 1962-2297 ?PT Time Calculation (min) (ACUTE ONLY): 30 min ? ? ?Charges:   PT Evaluation ?$PT Eval Moderate Complexity: 1 Mod ?PT Treatments ?$Therapeutic Activity: 8-22 mins ?  ?   ? ? ?Chau Sawin A. Gilford Rile, PT, DPT ?Acute Rehabilitation Services ?Pager [phone] ?Office [phone] ? ? ?Zyron Deeley A Masashi Snowdon ?03/10/2021, 4:13 PM ? ?

## 2021-03-10 NOTE — Progress Notes (Signed)
?[facility] ?Progress Note  ? ?Subjective: Seen on HD. Midodrine appears to be working now, actually slightly hypertensive. C/O nausea, asking for phenergan. Out of ICU, says he feels a little better.   ? ?Objective ?Vitals:  ? 03/10/21 0501 03/10/21 0745 03/10/21 0810 03/10/21 0830  ?BP: (!) 120/45 (!) 149/102 (!) 149/105 (!) 103/52  ?Pulse: 70 75 70 81  ?Resp: 18 12 18 12   ?Temp: 98.6 ?F (37 ?C) 98.2 ?F (36.8 ?C)    ?TempSrc: Oral Temporal    ?SpO2: 97%     ?Weight:  123.7 kg    ?Height:      ? ?Physical Exam ?General: Chronically ill appearing male in NAD ?Heart: I9,C7 RRR 2/6 systolic M. SF on monitor. ?Lungs: CTAB ?Abdomen: NABS, ND. Ileostomy in place.  ?Extremities: Trace pedal edema.  ?Dialysis Access: R thigh TDC blood lines connected ? ? ?Additional Objective ?Labs: ?Basic Metabolic Panel: ?Recent Labs  ?Lab 03/08/21 ?0631 03/09/21 ?0603 03/10/21 ?0424  ?NA 132* 132* 136  ?K 5.0 4.8 6.0*  ?CL 92* 93* 95*  ?CO2 26 27 25   ?GLUCOSE 79 69* 82  ?BUN 45* 65* 82*  ?CREATININE 8.60* 9.85* 11.43*  ?CALCIUM 7.5* 8.3* 8.3*  ?PHOS  --   --  6.0*  ? ?Liver Function Tests: ?Recent Labs  ?Lab 03/07/21 ?1125 03/08/21 ?0631 03/10/21 ?0424  ?AST 16 26  --   ?ALT 11 17  --   ?ALKPHOS 149* 115  --   ?BILITOT 0.5 0.5  --   ?PROT 7.3 6.1*  --   ?ALBUMIN 3.5 2.7* 2.9*  ? ?No results for input(s): LIPASE, AMYLASE in the last 168 hours. ?CBC: ?Recent Labs  ?Lab 03/07/21 ?1125 03/08/21 ?0631 03/09/21 ?0603 03/10/21 ?0424  ?WBC 3.4* 4.4 3.1* 2.7*  ?NEUTROABS 3.3 4.1  --  1.9  ?HGB 12.2* 10.6* 9.6* 10.1*  ?HCT 39.8 33.3* 31.2* 32.9*  ?MCV 99.3 96.5 96.6 97.3  ?PLT 108* 75* 68* 67*  ? ?Blood Culture ?   ?Component Value Date/Time  ? SDES BLOOD RIGHT ANTECUBITAL 03/09/2021 1601  ? SPECREQUEST  03/09/2021 1601  ?  BOTTLES DRAWN AEROBIC ONLY Blood Culture adequate volume ?Performed at [facility], [place] [address], [place], [place] [address] ?  ? CULT PENDING 03/09/2021 1601  ? REPTSTATUS PENDING 03/09/2021 1601   ? ? ?Cardiac Enzymes: ?No results for input(s): CKTOTAL, CKMB, CKMBINDEX, TROPONINI in the last 168 hours. ?CBG: ?Recent Labs  ?Lab 03/08/21 ?0175  ?GLUCAP 73  ? ?Iron Studies: No results for input(s): IRON, TIBC, TRANSFERRIN, FERRITIN in the last 72 hours. ?@lablastinr3 @ ?Studies/Results: ?No results found. ?Medications: ? sodium chloride Stopped (03/08/21 0400)  ? sodium chloride    ? sodium chloride    ? cefTRIAXone (ROCEPHIN)  IV Stopped (03/09/21 1046)  ? [START ON 03/11/2021] promethazine (PHENERGAN) injection (IM or IVPB)    ? ? (feeding supplement) PROSource Plus  30 mL Oral TID BM  ? acetaminophen  1,000 mg Oral TID  ? aspirin EC  81 mg Oral Daily  ? Chlorhexidine Gluconate Cloth  6 each Topical Q0600  ? clopidogrel  75 mg Oral Daily  ? [START ON 03/11/2021] doxercalciferol  3 mcg Intravenous Q M,W,F-HD  ? escitalopram  10 mg Oral Daily  ? famotidine  40 mg Oral Daily  ? heparin  5,000 Units Subcutaneous Q8H  ? levothyroxine  137 mcg Oral QAC breakfast  ? midodrine  10 mg Oral TID WC  ? rosuvastatin  10 mg Oral Daily  ?  sodium zirconium cyclosilicate  10 g Oral Once  ? traZODone  100 mg Oral QHS  ? ? ? ?Dialysis Orders:  ?MWF - [facility] ([place]) ?4hrs35min, BFR 350, DFR 800,  EDW 121 kg, 2K/ 2Ca ?Heparin 3700 unit bolus ?No ESA-last Hgb 12.2 in OP HD center ?Hectorol 83mcg IV qHD-last 03/07/21 ?Sensipar 90mg  with HD-last 03/07/21 ?Phoslo 667mg  4 capsules with meals and 3 with snacks ?  ?Assessment/Plan: ?E Coli Bacteremia-on IV Ceftriaxone. Considering TTE. Per primary.   ?Chronic R 2/2 to chronic nephrolithiasis. Pain management per primary.  ?ESRD - on HD. HD today off schedule. Will try to schedule for tomorrow but knows if staffing is an issue, he may continue on T,Th, S schedule this week.  ?Hypertension/volume  - Trace peripheral edema. BP stable on midodrine. UF as tolerated. EDW raised 03/07/2020.   ?Anemia of CKD - now 9.6, was 12 in outpatient. Will start ESA here. Hold Fe for now  given current infection ?Secondary Hyperparathyroidism - Corrected Ca+ 9.1 PO4 6.0 On high dose calcium acetate. Will resume Hectorol with HD. Continue sensipar.  ?Nutrition - Renal diet with fluid restriction. Will start protein supplements ?Severe AS-followed by Dr. Angelena Form. ? ?[name]. Kimberlea Schlag NP-C ?03/10/2021, 9:14 AM  ?[facility] ?[phone] ? ? ?  ? ?

## 2021-03-10 NOTE — Progress Notes (Addendum)
? ? ?  Subjective:  ?Overnight Events: Patient came to our service at 7am this morning. No acute events in chart. ? ?Patient seen right before leaving floor for HD session this morning.  ? ?Objective:  ?Vital signs in last 24 hours: ?Vitals:  ? 03/10/21 0501 03/10/21 0745 03/10/21 0810 03/10/21 0830  ?BP: (!) 120/45 (!) 149/102 (!) 149/105 (!) 103/52  ?Pulse: 70 75 70 81  ?Resp: 18 12 18 12   ?Temp: 98.6 ?F (37 ?C) 98.2 ?F (36.8 ?C)    ?TempSrc: Oral Temporal    ?SpO2: 97%     ?Weight:  123.7 kg    ?Height:      ? ? ?Physical Exam:  ? ?General: Sitting in hospital bed, NAD ?Cardiovascular: Normal rate, regular rhythm. Midsystolic murmur  ?Pulmonary : Effort normal, breath sounds normal. No wheezes, rales, or rhonchi ?Abdominal: soft, nontender, BS+, RLQ ileostomy  ?Ext: Trace edema. R, no deformity   ?Skin: Warm, dry , no bruising, erythema, or rash ? ? ? ?Assessment/Plan:  ? ?Principal Problem: ?  Bilateral flank pain ?Active Problems: ?  Nonhealing surgical wound ?  ESRD (end stage renal disease) on dialysis [facility]) ?  Fever ?  Bilateral kidney stones ?  Sepsis ([place]) ?  Hypotension ? ? ?Patient Summary: ?Hospital day 3 for [name] a 56 y.o. person living with ESRD on HD T/TH/S, hypothyroidism, chronic pain, admitted for severe sepsis secondary to E coli bacteremia. . ? ? ?Hypotension secondary to sepsis from E.Coli Bacteremia , likely source of infection is femoral HD Catheter ?-Patient still on midodrine for hypotension, will decrease to 5 mg to see if patients stays normotensive before discontinuing.  ?- Day 3 , Continue Ceftriaxone.  ? ?ESRD on T/TH/S HD ?Hyperkalemia  ?- Patient given one dose of lokelma and EKG ordered. Patient was being taken to HD before EKG completed.  ? ?Chronic Pain Syndrome ?Patient on chronic opioids for spina bifida and renal stones from chart review. ?PDMP shows long term prescription of oxycodone-acetaminophen 10-325 4 times daily.  ?-Patient continued to have pain this morning  and gave IV Dilaudid for breakthrough pain. ?-Tylenol scheduled for mild pain ?-Discontinued Oxycodone 5 mg pain regimen ?- restarted home oxycodone-acetaminophen 10-3 25 4  times daily for moderate pain ? ?Severe aortic Stenosis ?-Continue patient's Plavix, ASA, Crestor ?-Has outpatient follow-up scheduled 3/15 with cardiologist Dr. Angelena Form ? ?Thrombocytopenia ?Leukopenia ?- Trend cell lines, this is acute change in setting of sepsis. Expect cell counts to gradually improve.  ? ?Anemia of Chronic Disease  ?-Patient being given ESA by nephrology ?- Hgb 7-10 on review for past 2 months ?- Stable today ? ?Diet: Renal ?IVF: None,None ?VTE: Heparin ?Code: Full ?PT/OT recs: Pending, using straight cane on admission ? ? ? ?Dispo: Anticipated discharge to Home in 1-2 days pending continued improvement on IV antibiotics and no longer hypotensive off of midodrine.  ? ?Darrell Snider, MD ?PGY3 Internal Medicine ?Pager: [phone] ?Please contact the on call pager after 5 pm and on weekends at [phone]. ? ?

## 2021-03-11 DIAGNOSIS — A419 Sepsis, unspecified organism: Secondary | ICD-10-CM | POA: Diagnosis not present

## 2021-03-11 DIAGNOSIS — I959 Hypotension, unspecified: Secondary | ICD-10-CM | POA: Diagnosis not present

## 2021-03-11 LAB — CBC WITH DIFFERENTIAL/PLATELET
Abs Immature Granulocytes: 0.08 10*3/uL — ABNORMAL HIGH (ref 0.00–0.07)
Basophils Absolute: 0 10*3/uL (ref 0.0–0.1)
Basophils Relative: 1 %
Eosinophils Absolute: 0.2 10*3/uL (ref 0.0–0.5)
Eosinophils Relative: 5 %
HCT: 31.9 % — ABNORMAL LOW (ref 39.0–52.0)
Hemoglobin: 9.6 g/dL — ABNORMAL LOW (ref 13.0–17.0)
Immature Granulocytes: 2 %
Lymphocytes Relative: 10 %
Lymphs Abs: 0.4 10*3/uL — ABNORMAL LOW (ref 0.7–4.0)
MCH: 29.5 pg (ref 26.0–34.0)
MCHC: 30.1 g/dL (ref 30.0–36.0)
MCV: 98.2 fL (ref 80.0–100.0)
Monocytes Absolute: 0.4 10*3/uL (ref 0.1–1.0)
Monocytes Relative: 11 %
Neutro Abs: 2.4 10*3/uL (ref 1.7–7.7)
Neutrophils Relative %: 71 %
Platelets: 72 10*3/uL — ABNORMAL LOW (ref 150–400)
RBC: 3.25 MIL/uL — ABNORMAL LOW (ref 4.22–5.81)
RDW: 17 % — ABNORMAL HIGH (ref 11.5–15.5)
WBC: 3.5 10*3/uL — ABNORMAL LOW (ref 4.0–10.5)
nRBC: 0 % (ref 0.0–0.2)

## 2021-03-11 LAB — RENAL FUNCTION PANEL
Albumin: 2.7 g/dL — ABNORMAL LOW (ref 3.5–5.0)
Anion gap: 15 (ref 5–15)
BUN: 58 mg/dL — ABNORMAL HIGH (ref 6–20)
CO2: 22 mmol/L (ref 22–32)
Calcium: 8.4 mg/dL — ABNORMAL LOW (ref 8.9–10.3)
Chloride: 99 mmol/L (ref 98–111)
Creatinine, Ser: 9.1 mg/dL — ABNORMAL HIGH (ref 0.61–1.24)
GFR, Estimated: 6 mL/min — ABNORMAL LOW (ref >60)
Glucose, Bld: 69 mg/dL — ABNORMAL LOW (ref 70–99)
Phosphorus: 4.6 mg/dL (ref 2.5–4.6)
Potassium: 5 mmol/L (ref 3.5–5.1)
Sodium: 136 mmol/L (ref 135–145)

## 2021-03-11 LAB — CULTURE, BLOOD (ROUTINE X 2)

## 2021-03-11 MED ORDER — SENNA 8.6 MG PO TABS
2.0000 | ORAL_TABLET | Freq: Two times a day (BID) | ORAL | Status: DC
  Administered 2021-03-11 – 2021-03-17 (×12): 17.2 mg via ORAL
  Filled 2021-03-11 (×12): qty 2

## 2021-03-11 MED ORDER — HEPARIN SODIUM (PORCINE) 1000 UNIT/ML DIALYSIS: 1000.0000 [IU] | INTRAMUSCULAR | Status: DC | PRN

## 2021-03-11 MED ORDER — CEFAZOLIN SODIUM-DEXTROSE 2-4 GM/100ML-% IV SOLN
2.0000 g | INTRAVENOUS | Status: DC
  Administered 2021-03-12 – 2021-03-16 (×3): 2 g via INTRAVENOUS
  Filled 2021-03-11 (×3): qty 100

## 2021-03-11 MED ORDER — ALTEPLASE 2 MG IJ SOLR: 2.0000 mg | Freq: Once | INTRAMUSCULAR | Status: AC

## 2021-03-11 MED ORDER — ALTEPLASE 2 MG IJ SOLR
INTRAMUSCULAR | Status: AC
  Administered 2021-03-11: 2 mg
  Filled 2021-03-11: qty 2

## 2021-03-11 MED ORDER — SODIUM CHLORIDE 0.9 % IV SOLN: 100.0000 mL | INTRAVENOUS | Status: DC | PRN

## 2021-03-11 MED ORDER — HEPARIN SODIUM (PORCINE) 1000 UNIT/ML DIALYSIS
3700.0000 [IU] | Freq: Once | INTRAMUSCULAR | Status: DC
  Filled 2021-03-11: qty 4

## 2021-03-11 MED ORDER — ONDANSETRON HCL 4 MG/2ML IJ SOLN
4.0000 mg | Freq: Four times a day (QID) | INTRAMUSCULAR | Status: DC | PRN
  Administered 2021-03-11 – 2021-03-17 (×9): 4 mg via INTRAVENOUS
  Filled 2021-03-11 (×11): qty 2

## 2021-03-11 MED ORDER — POLYETHYLENE GLYCOL 3350 17 G PO PACK
17.0000 g | PACK | Freq: Every day | ORAL | Status: DC
  Administered 2021-03-11 – 2021-03-17 (×6): 17 g via ORAL
  Filled 2021-03-11 (×6): qty 1

## 2021-03-11 MED ORDER — LIDOCAINE HCL (PF) 1 % IJ SOLN: 5.0000 mL | INTRAMUSCULAR | Status: DC | PRN

## 2021-03-11 MED ORDER — ALTEPLASE 2 MG IJ SOLR
2.0000 mg | Freq: Once | INTRAMUSCULAR | Status: AC | PRN
  Filled 2021-03-11: qty 2

## 2021-03-11 MED ORDER — ONDANSETRON HCL 4 MG/2ML IJ SOLN
4.0000 mg | Freq: Once | INTRAMUSCULAR | Status: AC | PRN
  Administered 2021-03-11: 4 mg via INTRAVENOUS
  Filled 2021-03-11: qty 2

## 2021-03-11 NOTE — Progress Notes (Signed)
Patient has Cathflo Activase in catheter to dwell until next treatment. ?

## 2021-03-11 NOTE — Progress Notes (Addendum)
? ?Subjective: NAEON ? ?Patient seen in HD.  Voicing concerns about pain anterior in the distribution of his kidneys, as well as across bilateral lower quadrants.  Patient reports that he has not had a bowel movement since Monday.  He reports that he would like to have his pain managed, bowels functioning, and blood pressure under control prior to discharge. ? ?Objective: ? ?Vital signs in last 24 hours: ?Vitals:  ? 03/11/21 1100 03/11/21 1130 03/11/21 1200 03/11/21 1308  ?BP: (!) 108/38 (!) 88/40 (!) 104/40 (!) 105/40  ?Pulse: 75 70 76 69  ?Resp: 13 15 17 17   ?Temp:    (!) 97.2 ?F (36.2 ?C)  ?TempSrc:    Oral  ?SpO2:    97%  ?Weight:    122.4 kg  ?Height:      ? ?Physical Exam: ?General: chronically ill-appearing male on HD in acute distress; diaphoretic, holding on to bed rail with BP dropping into the 81X, systolic.  ?HENT: normocephalic, atraumatic, external nares and ears appear normal ?EYES: conjunctiva non-erythematous, no scleral icterus ?CV: regular rate, normal rhythm on telemetry ?Pulmonary: normal work of breathing on RA ?Abdominal: Obese, tender to palpation along bilateral lower quadrants and in the distribution of both kidneys ?Skin: Warm and dry with decreased skin turgor ?Neurological: Awake, alert and oriented x3.  ?Psych: Sad and mildly irritable affect and normal behavior  ? ?Assessment/Plan: ? ?Principal Problem: ?  Bilateral flank pain ?Active Problems: ?  Nonhealing surgical wound ?  ESRD (end stage renal disease) on dialysis [facility]) ?  Fever ?  Bilateral kidney stones ?  Sepsis ([place]) ?  Hypotension ? ?Patient Summary: ?[name] is a 56 y.o. male with PMHx ESRD on HD T/TH/S, hypothyroidism, and chronic pain, initially presenting from HD for nausea, bilateral flank pain, and hypotension who was then admitted to the ICU for management of an arterial line and hypotension 2/2 severe sepsis from E coli bacteremia. ?  ?  ?Hypotension secondary to sepsis from E.Coli Bacteremia, likely source  of infection is kidneys versus femoral HD Catheter ?Patient reports prior history of bacteremia with suspected source of femoral HD catheter; when cultures drawn from catheter, negative.  He continues to report pain ?-Patient still on midodrine 5 mg for hypotension, will consider decreasing further if patients remains normotensive postdialysis.  ?- Per sensitivities, adjust abx to Cefazolin with HD. ?  ?ESRD on T/TH/S HD ?Hyperkalemia  ?- Patient given one dose of lokelma and EKG ordered. Patient was being taken to HD before EKG completed.  ?  ?Chronic Pain Syndrome ?Patient on chronic opioids for spina bifida and renal stones from chart review. ?PDMP shows long term prescription of oxycodone-acetaminophen 10-325 QID.  ?-Tylenol scheduled for mild pain ?-Dilaudid 0.5 mg every 4 hours PRN moderate to severe pain ?- restarted home Percocet 10-325 QID PRN moderate pain ?-- Senna 2 tablets BID and Miralax daily added for constipation, which may be 2/2 pain medication regimen. ?  ?Severe aortic Stenosis ?-Continue patient's Plavix, ASA, Crestor ?-Has outpatient follow-up scheduled 3/15 with cardiologist Dr. Angelena Form ?  ?Thrombocytopenia ?Leukopenia ?WBC 3.5, increased from 2.7.  Platelets 72, increased from 67 ?-We will continue to trend with daily CBC; this is acute change in setting of sepsis. Expect cell counts to gradually improve.  ?  ?Anemia of Chronic Disease ?Hgb 9.6, stable from yesterday's 10.1. ?-Patient being given ESA by nephrology ?- Hgb 7-10 on review for past 2 months ?-Continue to trend with daily CBC ?  ? ?Prior to Admission  Living Arrangement: ?Anticipated Discharge Location: ?Barriers to Discharge: ?Dispo: Anticipated discharge in approximately 2-3 day(s).  ? ?Rosezetta Schlatter, MD ?03/11/2021, 1:16 PM ?Pager: [phone] ?After 5pm on weekdays and 1pm on weekends: On Call pager [phone]  ?

## 2021-03-11 NOTE — Progress Notes (Signed)
Physical Therapy Treatment ?Patient Details ?Name: [name] ?MRN: [id number] ?DOB: 08-23-1965 ?Today's Date: 03/11/2021 ? ? ?History of Present Illness 56 y/o male presented to ED on 03/07/21 from dialysis for nausea, R flank pain, and hypotension. Admitted for sepsis workup. Found to have +E Coli. CT abdomen negative. CXR negative. PMH: ESRD, seizure disorder, HTN, aortic stenosis, spina bifida, anemia, obesity, anxiety ? ?  ?PT Comments  ? ? Patient agreeable to in room gait training to improve activity tolerance. Patient ambulated 14' x 2 with RW and min guard for safety. Patient with leg length discrepancy (R>L) which contributes to atypical gait pattern. Encouraged patient for continued OOB mobility over the weekend to improve strength and activity tolerance. D/c plan remains appropriate.  ?   ?Recommendations for follow up therapy are one component of a multi-disciplinary discharge planning process, led by the attending physician.  Recommendations may be updated based on patient status, additional functional criteria and insurance authorization. ? ?Follow Up Recommendations ? Home health PT ?  ?  ?Assistance Recommended at Discharge Set up Supervision/Assistance  ?Patient can return home with the following   ?  ?Equipment Recommendations ? None recommended by PT (patient owns necessary equipment)  ?  ?Recommendations for Other Services   ? ? ?  ?Precautions / Restrictions Precautions ?Precautions: Fall ?Precaution Comments: perm R femoral HD catheter ?Restrictions ?Weight Bearing Restrictions: No  ?  ? ?Mobility ? Bed Mobility ?Overal bed mobility: Needs Assistance ?Bed Mobility: Rolling, Sidelying to Sit, Sit to Sidelying ?Rolling: Supervision ?Sidelying to sit: Supervision ?  ?  ?Sit to sidelying: Supervision ?General bed mobility comments: supervision for safety ?  ? ?Transfers ?Overall transfer level: Needs assistance ?Equipment used: Rolling Mylon Mabey (2 wheels) ?Transfers: Sit to/from Stand ?Sit to  Stand: Min guard, From elevated surface ?  ?  ?  ?  ?  ?General transfer comment: min guard from extremely elevated surface. patient reporting he has to stand such a high surface due to avoiding kinking femoral catheter. States his bed is high. When asked about how he stands from w/c, patient defers to answer. Encouraged patient to attempt to stand from lower surface but patient defers this session ?  ? ?Ambulation/Gait ?Ambulation/Gait assistance: Min guard ?Gait Distance (Feet): 14 Feet (+14') ?Assistive device: Rolling Jerusalem Brownstein (2 wheels) ?Gait Pattern/deviations: Step-to pattern, Antalgic ?Gait velocity: decreased ?  ?  ?General Gait Details: R LE longer than LLE so demonstrates waddle gait. Min guard for safety ? ? ?Stairs ?  ?  ?  ?  ?  ? ? ?Wheelchair Mobility ?  ? ?Modified Rankin (Stroke Patients Only) ?  ? ? ?  ?Balance Overall balance assessment: Needs assistance ?Sitting-balance support: No upper extremity supported, Feet supported ?Sitting balance-Leahy Scale: Fair ?  ?  ?Standing balance support: Bilateral upper extremity supported, Reliant on assistive device for balance ?Standing balance-Leahy Scale: Poor ?Standing balance comment: reliant on UE support ?  ?  ?  ?  ?  ?  ?  ?  ?  ?  ?  ?  ? ?  ?Cognition Arousal/Alertness: Awake/alert ?Behavior During Therapy: [facility] for tasks assessed/performed ?Overall Cognitive Status: Within Functional Limits for tasks assessed ?  ?  ?  ?  ?  ?  ?  ?  ?  ?  ?  ?  ?  ?  ?  ?  ?  ?  ?  ? ?  ?Exercises   ? ?  ?General Comments   ?  ?  ? ?  Pertinent Vitals/Pain Pain Assessment ?Pain Assessment: Faces ?Faces Pain Scale: Hurts even more ?Pain Location: abdomen ?Pain Descriptors / Indicators: Aching, Discomfort, Grimacing, Guarding, Sore ?Pain Intervention(s): Monitored during session  ? ? ?Home Living   ?  ?  ?  ?  ?  ?  ?  ?  ?  ?   ?  ?Prior Function    ?  ?  ?   ? ?PT Goals (current goals can now be found in the care plan section) Acute Rehab PT Goals ?Patient Stated  Goal: to reduce pain and go home ?PT Goal Formulation: With patient ?Time For Goal Achievement: 03/24/21 ?Potential to Achieve Goals: Good ?Progress towards PT goals: Progressing toward goals ? ?  ?Frequency ? ? ? Min 3X/week ? ? ? ?  ?PT Plan Current plan remains appropriate  ? ? ?Co-evaluation   ?  ?  ?  ?  ? ?  ?AM-PAC PT "6 Clicks" Mobility   ?Outcome Measure ? Help needed turning from your back to your side while in a flat bed without using bedrails?: A Little ?Help needed moving from lying on your back to sitting on the side of a flat bed without using bedrails?: A Little ?Help needed moving to and from a bed to a chair (including a wheelchair)?: A Little ?Help needed standing up from a chair using your arms (e.g., wheelchair or bedside chair)?: A Little ?Help needed to walk in hospital room?: A Little ?Help needed climbing 3-5 steps with a railing? : A Little ?6 Click Score: 18 ? ?  ?End of Session Equipment Utilized During Treatment: Gait belt ?Activity Tolerance: Patient tolerated treatment well ?Patient left: in bed;with call bell/phone within reach ?Nurse Communication: Mobility status ?PT Visit Diagnosis: Unsteadiness on feet (R26.81);Muscle weakness (generalized) (M62.81);Difficulty in walking, not elsewhere classified (R26.2) ?  ? ? ?Time: 5852-7782 ?PT Time Calculation (min) (ACUTE ONLY): 25 min ? ?Charges:  $Gait Training: 23-37 mins          ?          ? ?Kylene Zamarron A. Gilford Rile, PT, DPT ?Acute Rehabilitation Services ?Pager [phone] ?Office [phone] ? ? ? ?Bellanie Matthew A Jazari Ober ?03/11/2021, 5:03 PM ? ?

## 2021-03-11 NOTE — Progress Notes (Signed)
OT Cancellation Note ? ?Patient Details ?Name: [name] ?MRN: [id number] ?DOB: 04/20/1965 ? ? ?Cancelled Treatment:    Reason Eval/Treat Not Completed: Fatigue/lethargy limiting ability to participate;Patient at procedure or test/ unavailable: Attempted OT evaluation at [phone]. Pt declined due to lethargy and a "rough night".  Returned at 1105 and pt off floor for dialysis. Will continue efforts.  ? ?Julien Girt ?03/11/2021, 11:07 AM ?

## 2021-03-11 NOTE — Progress Notes (Addendum)
?[name] ?Progress Note  ? ?Subjective:Seen on HD. He is miserable. Says no one has given him pain medication today. Otherwise he appears stable, tolerating HD without issue. BFR 275. Continue to pack [facility] with cath flo between treatments or we will lose patency of TDC.  ? ?Objective ?Vitals:  ? 03/11/21 0516 03/11/21 0905 03/11/21 0914 03/11/21 0930  ?BP: (!) 117/44 137/69 133/67 (!) 120/49  ?Pulse: 64 71 68 67  ?Resp: 19 14    ?Temp: 98.1 ?F (36.7 ?C) (!) 97.1 ?F (36.2 ?C)    ?TempSrc: Oral Oral    ?SpO2: 97%     ?Weight:  124.5 kg    ?Height:      ? ?Physical Exam ?General: Chronically ill appearing male in NAD ?Heart: Q0,G8 RRR 2/6 systolic M. SF on monitor. ?Lungs: CTAB ?Abdomen: NABS, ND. Ileostomy in place.  ?Extremities: No pedal edema.  ?Dialysis Access: R thigh TDC blood lines connected ? ? ?Additional Objective ?Labs: ?Basic Metabolic Panel: ?Recent Labs  ?Lab 03/09/21 ?0603 03/10/21 ?0424 03/11/21 ?0424  ?NA 132* 136 136  ?K 4.8 6.0* 5.0  ?CL 93* 95* 99  ?CO2 27 25 22   ?GLUCOSE 69* 82 69*  ?BUN 65* 82* 58*  ?CREATININE 9.85* 11.43* 9.10*  ?CALCIUM 8.3* 8.3* 8.4*  ?PHOS  --  6.0* 4.6  ? ?Liver Function Tests: ?Recent Labs  ?Lab 03/07/21 ?1125 03/08/21 ?0631 03/10/21 ?0424 03/11/21 ?0424  ?AST 16 26  --   --   ?ALT 11 17  --   --   ?ALKPHOS 149* 115  --   --   ?BILITOT 0.5 0.5  --   --   ?PROT 7.3 6.1*  --   --   ?ALBUMIN 3.5 2.7* 2.9* 2.7*  ? ?No results for input(s): LIPASE, AMYLASE in the last 168 hours. ?CBC: ?Recent Labs  ?Lab 03/07/21 ?1125 03/08/21 ?0631 03/09/21 ?0603 03/10/21 ?0424 03/11/21 ?6761  ?WBC 3.4* 4.4 3.1* 2.7* 3.5*  ?NEUTROABS 3.3 4.1  --  1.9 2.4  ?HGB 12.2* 10.6* 9.6* 10.1* 9.6*  ?HCT 39.8 33.3* 31.2* 32.9* 31.9*  ?MCV 99.3 96.5 96.6 97.3 98.2  ?PLT 108* 75* 68* 67* 72*  ? ?Blood Culture ?   ?Component Value Date/Time  ? SDES BLOOD RIGHT ANTECUBITAL 03/09/2021 1601  ? SDES BLOOD LEFT ANTECUBITAL 03/09/2021 1601  ? SPECREQUEST  03/09/2021 1601  ?  BOTTLES DRAWN  AEROBIC ONLY Blood Culture adequate volume  ? SPECREQUEST  03/09/2021 1601  ?  BOTTLES DRAWN AEROBIC ONLY Blood Culture results may not be optimal due to an inadequate volume of blood received in culture bottles  ? CULT  03/09/2021 1601  ?  NO GROWTH < 24 HOURS ?Performed at [facility], [place] [address] [place], [place] [address] ?  ? CULT  03/09/2021 1601  ?  NO GROWTH < 24 HOURS ?Performed at [facility], [place] [address], [place], [place] [address] ?  ? REPTSTATUS PENDING 03/09/2021 1601  ? REPTSTATUS PENDING 03/09/2021 1601  ? ? ?Cardiac Enzymes: ?No results for input(s): CKTOTAL, CKMB, CKMBINDEX, TROPONINI in the last 168 hours. ?CBG: ?Recent Labs  ?Lab 03/08/21 ?4580  ?GLUCAP 73  ? ?Iron Studies: No results for input(s): IRON, TIBC, TRANSFERRIN, FERRITIN in the last 72 hours. ?@lablastinr3 @ ?Studies/Results: ?No results found. ?Medications: ? sodium chloride Stopped (03/08/21 0400)  ? cefTRIAXone (ROCEPHIN)  IV Stopped (03/10/21 1330)  ? promethazine (PHENERGAN) injection (IM or IVPB) 12.5 mg (03/10/21 1018)  ? ? (feeding supplement) PROSource Plus  30 mL Oral TID BM  ? acetaminophen  1,000 mg Oral TID  ? aspirin EC  81 mg Oral Daily  ? Chlorhexidine Gluconate Cloth  6 each Topical Q0600  ? Chlorhexidine Gluconate Cloth  6 each Topical Q0600  ? clopidogrel  75 mg Oral Daily  ? doxercalciferol  3 mcg Intravenous Q M,W,F-HD  ? escitalopram  10 mg Oral Daily  ? famotidine  40 mg Oral Daily  ? heparin  5,000 Units Subcutaneous Q8H  ? levothyroxine  137 mcg Oral QAC breakfast  ? midodrine  5 mg Oral TID WC  ? rosuvastatin  10 mg Oral Daily  ? traZODone  100 mg Oral QHS  ? ? ? ?Dialysis Orders:  ?MWF - [place] ([place]) ?4hrs13min, BFR 350, DFR 800,  EDW 121 kg, 2K/ 2Ca ?Heparin 3700 unit bolus ?No ESA-last Hgb 12.2 in OP HD center ?Hectorol 8mcg IV qHD-last 03/07/21 ?Sensipar 90mg  with HD-last 03/07/21 ?Phoslo 667mg  4 capsules with meals and 3 with snacks ?  ?Assessment/Plan: ?E Coli  Bacteremia-on IV Ceftriaxone. Considering TTE. Per primary.   ?Chronic R 2/2 to chronic nephrolithiasis. Pain management per primary.  ?ESRD - on HD. HD today off schedule. Fortunately able to get back on MWF schedule today. Next HD 03/14/2021.  ?Hypertension/volume  - Trace peripheral edema. BP stable on midodrine. UF as tolerated. EDW raised 03/07/2020.   ?Anemia of CKD - now 9.6, was 12 in outpatient. Will start ESA here. Hold Fe for now given current infection ?Secondary Hyperparathyroidism - Corrected Ca+ 9.1 PO4 6.0 On high dose calcium acetate. Will resume Hectorol with HD. Continue sensipar.  ?Nutrition - Renal diet with fluid restriction. Will start protein supplements ?Severe AS-followed by Dr. Angelena Form. ?9.    Issues with TDC: Chronic issue but last access available for this patient. TDC MUST be packed with cath flo q treatment.  ? ?[name]. Darrell Cherne NP-C ?03/11/2021, 9:53 AM  ?[name] ?[phone] ? ? ?  ? ?

## 2021-03-11 NOTE — TOC Initial Note (Signed)
Transition of Care (TOC) - Initial/Assessment Note  ? ? ?Patient Details  ?Name: [name] ?MRN: [id number] ?Date of Birth: 03-29-1965 ? ?Transition of Care (TOC) CM/SW Contact:    ?Tom-Johnson, Renea Ee, RN ?Phone Number: ?03/11/2021, 5:24 PM ? ?Clinical Narrative:                 ? ?CM spoke with patient at bedside about needs for post hospital transition. Admitted for Bilateral Flank Pain. From home with cousin. Has no children and parents are deceased. Has one sister in [place]. Currently on disability. Uses [employer] [place] for transportation. Has a cane, walker, wheelchair at home.  ?Home health recommended by PT/OT. Patient was active with [facility] disciplines and requested to continue with them. Referral called in to Amy with acceptance voiced. ?PCP is [name], MD [facility] [address]. CM will continue to follow with needs. ? ?Expected Discharge Plan: [place] ?Barriers to Discharge: Continued Medical Work up ? ? ?Patient Goals and CMS Choice ?Patient states their goals for this hospitalization and ongoing recovery are:: To return home ?CMS Medicare.gov Compare Post Acute Care list provided to:: Patient ?Choice offered to / list presented to : Patient ? ?Expected Discharge Plan and Services ?Expected Discharge Plan: [place] ?  ?Discharge Planning Services: CM Consult ?Post Acute Care Choice: Home Health ?Living arrangements for the past 2 months: [place] ?                ?DME Arranged: N/A ?DME Agency: NA ?  ?  ?  ?HH Arranged: PT, OT ?[place] Agency: [place] ?Date HH Agency Contacted: 03/11/21 ?Time [place]: 6301 ?Representative spoke with at [place]: [place] ? ?Prior Living Arrangements/Services ?Living arrangements for the past 2 months: [place] ?Lives with:: Relatives (Arrey) ?Patient language and need for interpreter reviewed:: Yes ?Do you feel safe going back to the place where you live?:  Yes      ?Need for Family Participation in Patient Care: Yes (Comment) ?Care giver support system in place?: Yes (comment) ?Current home services: Home PT, Home OT ?Criminal Activity/Legal Involvement Pertinent to Current Situation/Hospitalization: No - Comment as needed ? ?Activities of Daily Living ?  ?  ? ?Permission Sought/Granted ?Permission sought to share information with : Case Manager, Customer service manager, Family Supports ?Permission granted to share information with : Yes, Verbal Permission Granted ? Share Information with NAME: Amy ? Permission granted to share info w AGENCY: [facility] ?   ?   ? ?Emotional Assessment ?Appearance:: Appears stated age ?Attitude/Demeanor/Rapport: Engaged, Gracious ?Affect (typically observed): Accepting, Appropriate, Calm, Hopeful ?Orientation: : Oriented to Self, Oriented to Place, Oriented to  Time, Oriented to Situation ?Alcohol / Substance Use: Not Applicable ?Psych Involvement: No (comment) ? ?Admission diagnosis:  Sepsis associated hypotension ([place]) [A41.9, I95.9] ?Hypotension [I95.9] ?Sepsis ([place]) [A41.9] ?Patient Active Problem List  ? Diagnosis Date Noted  ? Hypotension 03/07/2021  ? Shortness of breath 11/15/2020  ? Palliative care by specialist   ? Goals of care, counseling/discussion   ? 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 Platelet dysfunction (HCC)   ? Abscess of left thigh 01/28/2020  ? Cutaneous abscess of left upper limb 01/27/2020  ? Pain in right foot 01/20/2020  ? Spina bifida, unspecified ([place]) 01/20/2020  ? Acute gout of right knee 12/10/2019  ? Bacterial infection, unspecified 10/20/2019  ? Chest pressure   ? Coronary artery  calcification   ? Chest pain   ? Sepsis ([place])   ? Vascular graft infection ([place])   ? Wound infection   ? Pyogenic arthritis of left knee joint ([place])   ? Pain and swelling of knee, left   ? Pain in left knee 10/03/2019  ? Complication of vascular access for dialysis   ? Hyperkalemia   ? Dysphagia   ?  Unspecified open wound, left thigh, initial encounter 09/15/2019  ? Nausea 08/29/2019  ? Allergy, unspecified, initial encounter 08/27/2019  ? Anaphylactic shock, unspecified, initial encounter 08/27/2019  ? Diarrhea, unspecified 04/30/2019  ? Ureteral stone with hydronephrosis 03/26/2019  ? Malfunction of nephrostomy tube ([place])   ? Complication of nephrostomy ([place]) 03/20/2019  ? Generalized abdominal pain 12/12/2018  ? 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 Abnormal CT scan, gastrointestinal tract   ? Bilateral flank pain 11/26/2018  ? Bilateral kidney stones 11/26/2018  ? Hydronephrosis with renal and ureteral calculus obstruction 11/26/2018  ? Left flank pain 11/26/2018  ? ESRD on dialysis ([place]) 06/12/2018  ? Mild protein-calorie malnutrition ([place]) 04/24/2018  ? Class 2 severe obesity due to excess calories with serious comorbidity and body mass index (BMI) of 37.0 to 37.9 in adult [facility]) 02/26/2018  ? PAD (peripheral artery disease) ([place]) 12/22/2017  ? Problem with dialysis access [facility]) 12/21/2017  ? Anemia 12/21/2017  ? Hypothyroidism 12/21/2017  ? GERD (gastroesophageal reflux disease) 12/21/2017  ? Anxiety 12/21/2017  ? Left great toe amputee ([place]) 12/19/2017  ? Methicillin susceptible Staphylococcus aureus infection as the cause of diseases classified elsewhere 12/19/2017  ? Endocarditis of mitral valve   ? Osteomyelitis of toe of left foot ([place])   ? MSSA bacteremia 12/08/2017  ? Fever   ? Right flank pain   ? ESRD (end stage renal disease) ([place]) 09/20/2017  ? ESRD (end stage renal disease) on dialysis ([place]) 08/07/2017  ? Severe aortic stenosis 02/26/2017  ? Nonhealing surgical wound 02/29/2016  ? Clotted dialysis access [facility]) 01/14/2016  ? Chills (without fever) 01/12/2016  ? Cutaneous abscess of back (any part, except buttock) 04/12/2015  ? Hypercalcemia 10/02/2014  ? Other specified respiratory disorders 09/17/2014  ? Other fatigue 06/15/2014  ? Hyperglycemia, unspecified  02/16/2014  ? Chronic pain 04/28/2013  ? Epilepsy ([place]) 12/30/2012  ? Headache, unspecified 12/09/2012  ? Pruritus, unspecified 12/09/2012  ? Coagulation defect, unspecified ([place]) 12/05/2012  ? Removal of staples 07/19/2012  ? Complication of AV dialysis fistula 06/27/2012  ? End stage renal disease ([place]) 06/27/2012  ? Hypoglycemia, unspecified 10/25/2011  ? Personal history of Methicillin resistant Staphylococcus aureus infection 11/17/2008  ? Allergy status to other drugs, medicaments and biological substances 10/02/2007  ? Anemia in chronic kidney disease 10/01/2007  ? Chronic kidney disease, unspecified 10/01/2007  ? Essential (primary) hypertension 10/01/2007  ? Iron deficiency anemia, unspecified 10/01/2007  ? Secondary hyperparathyroidism of renal origin ([place]) 10/01/2007  ? ?PCP:  [name], MD ?Pharmacy:   ?[id number] [place] [place] ?[place] [address] ?Phone: [phone] Fax: [phone] ? ? ? ? ?Social Determinants of Health (SDOH) Interventions ?  ? ?Readmission Risk Interventions ?Readmission Risk Prevention Plan 03/11/2021  ?Transportation Screening Complete  ?PCP or Specialist Appt within 3-5 Days Complete  ?[place] or Home Care Consult Complete  ?Social Work Consult for [place] Planning/Counseling Complete  ?Palliative Care Screening Not Applicable  ?Medication Review Press photographer) Complete  ?Some recent data might be hidden  ? ? ? ?

## 2021-03-12 ENCOUNTER — Inpatient Hospital Stay (HOSPITAL_COMMUNITY): Payer: Medicare Other

## 2021-03-12 DIAGNOSIS — N186 End stage renal disease: Secondary | ICD-10-CM | POA: Diagnosis not present

## 2021-03-12 DIAGNOSIS — R7881 Bacteremia: Secondary | ICD-10-CM | POA: Diagnosis not present

## 2021-03-12 DIAGNOSIS — Z992 Dependence on renal dialysis: Secondary | ICD-10-CM | POA: Diagnosis not present

## 2021-03-12 LAB — RENAL FUNCTION PANEL
Albumin: 2.8 g/dL — ABNORMAL LOW (ref 3.5–5.0)
Anion gap: 11 (ref 5–15)
BUN: 48 mg/dL — ABNORMAL HIGH (ref 6–20)
CO2: 29 mmol/L (ref 22–32)
Calcium: 8.7 mg/dL — ABNORMAL LOW (ref 8.9–10.3)
Chloride: 98 mmol/L (ref 98–111)
Creatinine, Ser: 8.64 mg/dL — ABNORMAL HIGH (ref 0.61–1.24)
GFR, Estimated: 7 mL/min — ABNORMAL LOW (ref >60)
Glucose, Bld: 96 mg/dL (ref 70–99)
Phosphorus: 4.1 mg/dL (ref 2.5–4.6)
Potassium: 4.8 mmol/L (ref 3.5–5.1)
Sodium: 138 mmol/L (ref 135–145)

## 2021-03-12 LAB — CBC
HCT: 30.8 % — ABNORMAL LOW (ref 39.0–52.0)
Hemoglobin: 9.3 g/dL — ABNORMAL LOW (ref 13.0–17.0)
MCH: 30 pg (ref 26.0–34.0)
MCHC: 30.2 g/dL (ref 30.0–36.0)
MCV: 99.4 fL (ref 80.0–100.0)
Platelets: 82 10*3/uL — ABNORMAL LOW (ref 150–400)
RBC: 3.1 MIL/uL — ABNORMAL LOW (ref 4.22–5.81)
RDW: 17 % — ABNORMAL HIGH (ref 11.5–15.5)
WBC: 3.8 10*3/uL — ABNORMAL LOW (ref 4.0–10.5)
nRBC: 0.5 % — ABNORMAL HIGH (ref 0.0–0.2)

## 2021-03-12 MED ORDER — FUROSEMIDE 40 MG PO TABS
80.0000 mg | ORAL_TABLET | Freq: Every day | ORAL | Status: DC
Start: 2021-03-12 — End: 2021-03-17
  Administered 2021-03-12 – 2021-03-17 (×6): 80 mg via ORAL
  Filled 2021-03-12 (×6): qty 2

## 2021-03-12 NOTE — Progress Notes (Signed)
?[facility] ?Progress Note  ? ?Subjective: No new issues. Next HD 03/14/2021.  ? ?Objective ?Vitals:  ? 03/11/21 1731 03/11/21 1940 03/12/21 0520 03/12/21 0937  ?BP: 118/64 136/61 107/61 (!) 107/42  ?Pulse: 67 74 (!) 59 66  ?Resp:  16 16 19   ?Temp:  98 ?F (36.7 ?C) 97.9 ?F (36.6 ?C) 98.1 ?F (36.7 ?C)  ?TempSrc:    Oral  ?SpO2:  97% 97% 97%  ?Weight:      ?Height:      ? ?Physical Exam ?General: Chronically ill appearing male in NAD ?Heart: D3,O6 RRR 2/6 systolic M. SF on monitor. ?Lungs: CTAB ?Abdomen: NABS, ND. Ileostomy in place.  ?Extremities: No pedal edema.  ?Dialysis Access: R thigh TDC Drsg intact ? ? ?Additional Objective ?Labs: ?Basic Metabolic Panel: ?Recent Labs  ?Lab 03/10/21 ?0424 03/11/21 ?0424 03/12/21 ?0211  ?NA 136 136 138  ?K 6.0* 5.0 4.8  ?CL 95* 99 98  ?CO2 25 22 29   ?GLUCOSE 82 69* 96  ?BUN 82* 58* 48*  ?CREATININE 11.43* 9.10* 8.64*  ?CALCIUM 8.3* 8.4* 8.7*  ?PHOS 6.0* 4.6 4.1  ? ?Liver Function Tests: ?Recent Labs  ?Lab 03/07/21 ?1125 03/08/21 ?0631 03/10/21 ?0424 03/11/21 ?0424 03/12/21 ?0211  ?AST 16 26  --   --   --   ?ALT 11 17  --   --   --   ?ALKPHOS 149* 115  --   --   --   ?BILITOT 0.5 0.5  --   --   --   ?PROT 7.3 6.1*  --   --   --   ?ALBUMIN 3.5 2.7* 2.9* 2.7* 2.8*  ? ?No results for input(s): LIPASE, AMYLASE in the last 168 hours. ?CBC: ?Recent Labs  ?Lab 03/08/21 ?0631 03/09/21 ?0603 03/10/21 ?0424 03/11/21 ?7124 03/12/21 ?0211  ?WBC 4.4 3.1* 2.7* 3.5* 3.8*  ?NEUTROABS 4.1  --  1.9 2.4  --   ?HGB 10.6* 9.6* 10.1* 9.6* 9.3*  ?HCT 33.3* 31.2* 32.9* 31.9* 30.8*  ?MCV 96.5 96.6 97.3 98.2 99.4  ?PLT 75* 68* 67* 72* 82*  ? ?Blood Culture ?   ?Component Value Date/Time  ? SDES BLOOD RIGHT ANTECUBITAL 03/09/2021 1601  ? SDES BLOOD LEFT ANTECUBITAL 03/09/2021 1601  ? SPECREQUEST  03/09/2021 1601  ?  BOTTLES DRAWN AEROBIC ONLY Blood Culture adequate volume  ? SPECREQUEST  03/09/2021 1601  ?  BOTTLES DRAWN AEROBIC ONLY Blood Culture results may not be optimal due to an  inadequate volume of blood received in culture bottles  ? CULT  03/09/2021 1601  ?  NO GROWTH 3 DAYS ?Performed at [facility], [place] [address] [place], [place] [address] ?  ? CULT  03/09/2021 1601  ?  NO GROWTH 3 DAYS ?Performed at [facility], [place] [address] [place], [place] [address] ?  ? REPTSTATUS PENDING 03/09/2021 1601  ? REPTSTATUS PENDING 03/09/2021 1601  ? ? ?Cardiac Enzymes: ?No results for input(s): CKTOTAL, CKMB, CKMBINDEX, TROPONINI in the last 168 hours. ?CBG: ?Recent Labs  ?Lab 03/08/21 ?5053  ?GLUCAP 73  ? ?Iron Studies: No results for input(s): IRON, TIBC, TRANSFERRIN, FERRITIN in the last 72 hours. ?@lablastinr3 @ ?Studies/Results: ?No results found. ?Medications: ?  ceFAZolin (ANCEF) IV 2 g (03/12/21 1137)  ? promethazine (PHENERGAN) injection (IM or IVPB) 12.5 mg (03/11/21 1110)  ? ? (feeding supplement) PROSource Plus  30 mL Oral TID BM  ? acetaminophen  1,000 mg Oral TID  ? aspirin EC  81 mg Oral Daily  ?  Chlorhexidine Gluconate Cloth  6 each Topical Q0600  ? Chlorhexidine Gluconate Cloth  6 each Topical Q0600  ? clopidogrel  75 mg Oral Daily  ? doxercalciferol  3 mcg Intravenous Q M,W,F-HD  ? escitalopram  10 mg Oral Daily  ? famotidine  40 mg Oral Daily  ? furosemide  80 mg Oral Daily  ? heparin  5,000 Units Subcutaneous Q8H  ? levothyroxine  137 mcg Oral QAC breakfast  ? midodrine  5 mg Oral TID WC  ? polyethylene glycol  17 g Oral Daily  ? rosuvastatin  10 mg Oral Daily  ? senna  2 tablet Oral BID  ? traZODone  100 mg Oral QHS  ? ? ? ?Dialysis Orders:  ?MWF - [place] ([place]) ?4hrs70min, BFR 350, DFR 800,  EDW 121 kg, 2K/ 2Ca ?Heparin 3700 units IV ?No ESA-last Hgb 12.2 in OP HD center ?Hectorol 89mcg IV qHD-last 03/07/21 ?Sensipar 90mg  with HD-last 03/07/21 ?Phoslo 667mg  4 capsules with meals and 3 with snacks ?  ?Assessment/Plan: ?E Coli Bacteremia-on IV Ceftriaxone. Considering TTE. Per primary.   ?Chronic R 2/2 to chronic nephrolithiasis. Pain management  per primary.  ?ESRD - on HD. HD today off schedule. Fortunately able to get back on MWF schedule today. Next HD 03/14/2021.  ?Hypertension/volume  - Trace peripheral edema. BP stable on midodrine. UF as tolerated. EDW raised 03/07/2020.   ?Anemia of CKD - now 9.6, was 12 in outpatient. Will start ESA here. Hold Fe for now given current infection ?Secondary Hyperparathyroidism - Corrected Ca+ 9.1 PO4 6.0 On high dose calcium acetate. Will resume Hectorol with HD. Continue sensipar.  ?Nutrition - Renal diet with fluid restriction. Will start protein supplements ?Severe AS-followed by Dr. Angelena Form. ?9.    Issues with TDC: Chronic issue but last access available for this patient. TDC MUST be packed with cath flo q treatment.  ? ?[name]. Ludwin Flahive NP-C ?03/12/2021, 12:00 PM  ?[facility] ?[phone] ? ? ?   ?

## 2021-03-12 NOTE — Progress Notes (Addendum)
? ?Subjective: NAEON ? ?Patient seen at the bedside.   ?Continues to voice concerns about pain anterior in the distribution of his kidneys, as well as across bilateral lower quadrants.  He also states that midodrine has caused headache and prevents him from working with PT/OT.  Patient reports a BM yesterday evening, but was not sufficient to relieve the pain.  He admits he is afraid because while on dialysis, there was some difficulty with his access.  He also admits to being afraid of discharging too quickly, as he does not want to decompensate.  His concerns are acknowledged and his questions answered. ? ?Objective: ? ?Vital signs in last 24 hours: ?Vitals:  ? 03/11/21 1731 03/11/21 1940 03/12/21 0520 03/12/21 0937  ?BP: 118/64 136/61 107/61 (!) 107/42  ?Pulse: 67 74 (!) 59 66  ?Resp:  16 16 19   ?Temp:  98 ?F (36.7 ?C) 97.9 ?F (36.6 ?C) 98.1 ?F (36.7 ?C)  ?TempSrc:    Oral  ?SpO2:  97% 97% 97%  ?Weight:      ?Height:      ? ?Physical Exam: ?General: chronically ill-appearing male in acute distress 2/2 pain ?HENT: normocephalic, atraumatic, external nares and ears appear normal ?EYES: conjunctiva non-erythematous, no scleral icterus ?CV: IV/VI systolic murmur appreciated. ?Pulmonary: normal work of breathing on RA ?Abdominal: Obese, tender to palpation along bilateral lower quadrants and in the distribution of both kidneys; bruising present on LLQ where he receives heparin injections. ?Skin: Warm and dry with decreased skin turgor ?MSK: L knee diffusely TTP and with motion. No swelling compared to R, No erythema, no warmth  ?Neurological: Awake, alert and oriented x3.  ?Psych: Sad mood and affect ? ?Assessment/Plan: ? ?Principal Problem: ?  Bilateral flank pain ?Active Problems: ?  Nonhealing surgical wound ?  ESRD (end stage renal disease) on dialysis [facility]) ?  Fever ?  Bilateral kidney stones ?  Sepsis ([place]) ?  Hypotension ? ?Patient Summary: ?[name] is a 56 y.o. male with PMHx ESRD on HD T/TH/S,  hypothyroidism, and chronic pain, initially presenting from HD for nausea, bilateral flank pain, and hypotension who was then admitted to the ICU for management of an arterial line and hypotension 2/2 severe sepsis from E coli bacteremia, now out of the ICU with management of pain, constipation, and BP. ?  ?  ?Hypotension secondary to E.Coli Bacteremia, likely source of infection is urinary track versus femoral HD Catheter ?Patient reports prior history of bacteremia with suspected source of femoral HD catheter; when cultures drawn from catheter, negative.  He continues to report pain. ?- Midodrine d/c per patient request d/t HA.  ?- Per sensitivities, adjusted abx to Cefazolin with HD. Day 6 of 14.  ?  ?ESRD on T/TH/S HD ?Hyperkalemia, resolved ?- Patient given one dose of lokelma and EKG ordered. Patient was being taken to HD before EKG completed.  ?  ?Chronic Pain Syndrome ?Acute L knee pain ?Patient on chronic opioids for spina bifida and renal stones from chart review. ?PDMP shows long term prescription of oxycodone-acetaminophen 10-325 QID. Patient reports hx of septic arthritis in L knee and significant injury to R knee.  ?-Left knee x-ray pending ?-Tylenol scheduled for mild pain ?-Dilaudid 0.5 mg every 4 hours PRN moderate to severe pain ?- Continue home Percocet 10-325 QID PRN moderate pain ?-- Senna 2 tablets BID and Miralax daily added for constipation, which may be 2/2 pain medication regimen. ?  ?Severe aortic Stenosis ?-Continue patient's Plavix, ASA, Crestor ?-Has outpatient follow-up scheduled  3/15 with cardiologist Dr. Angelena Form ?  ?Thrombocytopenia ?Leukopenia ?WBC 3.8, stable from 3.5.  Platelets 82, increased from 72 ?-We will continue to trend with daily CBC; this is acute change in setting of sepsis. Expect cell counts to gradually improve.  ?  ?Anemia of Chronic Disease ?Hgb 9.3, stable from yesterday's 9.6. ?-Patient being given ESA by nephrology ?- Hgb 7-10 on review for past 2  months ?-Continue to trend with daily CBC ?  ? ?Prior to Admission Living Arrangement: ?Anticipated Discharge Location: ?Barriers to Discharge: ?Dispo: Anticipated discharge in approximately 2-3 day(s).  ? Rosezetta Schlatter, MD ?03/12/2021, 11:07 AM ?Pager: [phone] ?After 5pm on weekdays and 1pm on weekends: On Call pager [phone]  ?

## 2021-03-12 NOTE — Progress Notes (Signed)
OT Cancellation Note ? ?Patient Details ?Name: [name] ?MRN: [id number] ?DOB: July 06, 1965 ? ? ?Cancelled Treatment:    Reason Eval/Treat Not Completed: Pain limiting ability to participate.OT orders received and reviewed for initial OT evaluation. Attempted to see pt at 09:11am, received with MD in room. Pt declining due to pain level. Requesting pain meds, reported to RN. OT will continue efforts at a later time as time allows.  ? ?Marius Ditch ?03/12/2021, 10:22 AM ?

## 2021-03-13 ENCOUNTER — Other Ambulatory Visit: Payer: Self-pay

## 2021-03-13 LAB — CBC WITH DIFFERENTIAL/PLATELET
Abs Immature Granulocytes: 0.42 10*3/uL — ABNORMAL HIGH (ref 0.00–0.07)
Basophils Absolute: 0 10*3/uL (ref 0.0–0.1)
Basophils Relative: 1 %
Eosinophils Absolute: 0.2 10*3/uL (ref 0.0–0.5)
Eosinophils Relative: 4 %
HCT: 33.2 % — ABNORMAL LOW (ref 39.0–52.0)
Hemoglobin: 10.1 g/dL — ABNORMAL LOW (ref 13.0–17.0)
Immature Granulocytes: 8 %
Lymphocytes Relative: 14 %
Lymphs Abs: 0.7 10*3/uL (ref 0.7–4.0)
MCH: 30 pg (ref 26.0–34.0)
MCHC: 30.4 g/dL (ref 30.0–36.0)
MCV: 98.5 fL (ref 80.0–100.0)
Monocytes Absolute: 0.5 10*3/uL (ref 0.1–1.0)
Monocytes Relative: 10 %
Neutro Abs: 3.4 10*3/uL (ref 1.7–7.7)
Neutrophils Relative %: 63 %
Platelets: 95 10*3/uL — ABNORMAL LOW (ref 150–400)
RBC: 3.37 MIL/uL — ABNORMAL LOW (ref 4.22–5.81)
RDW: 16.8 % — ABNORMAL HIGH (ref 11.5–15.5)
Smear Review: ADEQUATE
WBC: 5.2 10*3/uL (ref 4.0–10.5)
nRBC: 0 % (ref 0.0–0.2)

## 2021-03-13 LAB — RENAL FUNCTION PANEL
Albumin: 3.1 g/dL — ABNORMAL LOW (ref 3.5–5.0)
Anion gap: 12 (ref 5–15)
BUN: 60 mg/dL — ABNORMAL HIGH (ref 6–20)
CO2: 28 mmol/L (ref 22–32)
Calcium: 9 mg/dL (ref 8.9–10.3)
Chloride: 98 mmol/L (ref 98–111)
Creatinine, Ser: 10.09 mg/dL — ABNORMAL HIGH (ref 0.61–1.24)
GFR, Estimated: 6 mL/min — ABNORMAL LOW (ref >60)
Glucose, Bld: 121 mg/dL — ABNORMAL HIGH (ref 70–99)
Phosphorus: 3.8 mg/dL (ref 2.5–4.6)
Potassium: 5.1 mmol/L (ref 3.5–5.1)
Sodium: 138 mmol/L (ref 135–145)

## 2021-03-13 MED ORDER — HEPARIN SODIUM (PORCINE) 5000 UNIT/ML IJ SOLN
5000.0000 [IU] | Freq: Two times a day (BID) | INTRAMUSCULAR | Status: DC
  Administered 2021-03-14 – 2021-03-16 (×4): 5000 [IU] via SUBCUTANEOUS
  Filled 2021-03-13 (×6): qty 1

## 2021-03-13 NOTE — Progress Notes (Signed)
?[facility] ?Progress Note  ? ?Subjective: No new issues this AM. HD tomorrow on schedule. ? ?Objective ?Vitals:  ? 03/12/21 1347 03/12/21 1630 03/12/21 2049 03/13/21 0517  ?BP: 115/63 (!) 128/48 126/62 (!) 113/46  ?Pulse: 66 64 65 64  ?Resp: 18 18 20 16   ?Temp: 98.8 ?F (37.1 ?C) 98.1 ?F (36.7 ?C) 99.2 ?F (37.3 ?C) 98.6 ?F (37 ?C)  ?TempSrc: Oral Oral Oral Oral  ?SpO2:  98% 98% 97%  ?Weight:      ?Height:      ? ?Physical Exam ?General: Chronically ill appearing male in NAD ?Heart: O2,D7 RRR 2/6 systolic M. SF on monitor. ?Lungs: CTAB ?Abdomen: NABS, ND. Ileostomy in place.  ?Extremities: No pedal edema.  ?Dialysis Access: R thigh TDC Drsg intact ?  ? ?Additional Objective ?Labs: ?Basic Metabolic Panel: ?Recent Labs  ?Lab 03/11/21 ?0424 03/12/21 ?0211 03/13/21 ?0101  ?NA 136 138 138  ?K 5.0 4.8 5.1  ?CL 99 98 98  ?CO2 22 29 28   ?GLUCOSE 69* 96 121*  ?BUN 58* 48* 60*  ?CREATININE 9.10* 8.64* 10.09*  ?CALCIUM 8.4* 8.7* 9.0  ?PHOS 4.6 4.1 3.8  ? ?Liver Function Tests: ?Recent Labs  ?Lab 03/07/21 ?1125 03/08/21 ?0631 03/10/21 ?0424 03/11/21 ?0424 03/12/21 ?0211 03/13/21 ?0101  ?AST 16 26  --   --   --   --   ?ALT 11 17  --   --   --   --   ?ALKPHOS 149* 115  --   --   --   --   ?BILITOT 0.5 0.5  --   --   --   --   ?PROT 7.3 6.1*  --   --   --   --   ?ALBUMIN 3.5 2.7*   < > 2.7* 2.8* 3.1*  ? < > = values in this interval not displayed.  ? ?No results for input(s): LIPASE, AMYLASE in the last 168 hours. ?CBC: ?Recent Labs  ?Lab 03/09/21 ?0603 03/10/21 ?0424 03/11/21 ?4128 03/12/21 ?0211 03/13/21 ?0101  ?WBC 3.1* 2.7* 3.5* 3.8* 5.2  ?NEUTROABS  --  1.9 2.4  --  3.4  ?HGB 9.6* 10.1* 9.6* 9.3* 10.1*  ?HCT 31.2* 32.9* 31.9* 30.8* 33.2*  ?MCV 96.6 97.3 98.2 99.4 98.5  ?PLT 68* 67* 72* 82* 95*  ? ?Blood Culture ?   ?Component Value Date/Time  ? SDES BLOOD RIGHT ANTECUBITAL 03/09/2021 1601  ? SDES BLOOD LEFT ANTECUBITAL 03/09/2021 1601  ? SPECREQUEST  03/09/2021 1601  ?  BOTTLES DRAWN AEROBIC ONLY Blood Culture  adequate volume  ? SPECREQUEST  03/09/2021 1601  ?  BOTTLES DRAWN AEROBIC ONLY Blood Culture results may not be optimal due to an inadequate volume of blood received in culture bottles  ? CULT  03/09/2021 1601  ?  NO GROWTH 4 DAYS ?Performed at [facility], [place] [address], [place], [place] [address] ?  ? CULT  03/09/2021 1601  ?  NO GROWTH 4 DAYS ?Performed at [facility], [place] [address], [place], [place] [address] ?  ? REPTSTATUS PENDING 03/09/2021 1601  ? REPTSTATUS PENDING 03/09/2021 1601  ? ? ?Cardiac Enzymes: ?No results for input(s): CKTOTAL, CKMB, CKMBINDEX, TROPONINI in the last 168 hours. ?CBG: ?Recent Labs  ?Lab 03/08/21 ?7096  ?GLUCAP 73  ? ?Iron Studies: No results for input(s): IRON, TIBC, TRANSFERRIN, FERRITIN in the last 72 hours. ?@lablastinr3 @ ?Studies/Results: ?DG Knee 1-2 Views Left ? ?Result Date: 03/12/2021 ?CLINICAL DATA:  Chronic knee pain EXAM: LEFT KNEE -  1-2 VIEW COMPARISON:  10/05/2019 FINDINGS: Mild medial compartmental articular space narrowing. Regional atherosclerotic vascular calcifications. The patella appears substantially abnormal and elongated, measuring 7.6 cm craniocaudad, with a scalloped posterior margin and posterior marginal irregularity, as well as some mild lucency in the upper portion of the patella. This elongation could be from a prior fracture which has healed, or extensive proliferative osteophyte formation along the inferior patella. There is moderate wavy irregularity of the femoral trochlear groove. Mild subcutaneous edema anterior to the patellar tendon. No overt effusion in the suprapatellar bursa. IMPRESSION: 1. Abnormal elongated patella, possibly due to a prior fracture which has healed with the patella elongated, or prominent proliferative bony spurring along the inferior patella causing patellar lengthening. There is posterior patellar irregularity and some mild wavy irregularity along the femoral trochlear groove. 2. Mild subcutaneous edema  anterior to the patellar tendon. 3. Mild medial compartmental articular space narrowing. 4. Atherosclerosis. Electronically Signed   By: Van Clines M.D.   On: 03/12/2021 14:28   ?Medications: ?  ceFAZolin (ANCEF) IV 2 g (03/12/21 1137)  ? promethazine (PHENERGAN) injection (IM or IVPB) 12.5 mg (03/11/21 1110)  ? ? (feeding supplement) PROSource Plus  30 mL Oral TID BM  ? acetaminophen  1,000 mg Oral TID  ? aspirin EC  81 mg Oral Daily  ? Chlorhexidine Gluconate Cloth  6 each Topical Q0600  ? Chlorhexidine Gluconate Cloth  6 each Topical Q0600  ? clopidogrel  75 mg Oral Daily  ? doxercalciferol  3 mcg Intravenous Q M,W,F-HD  ? escitalopram  10 mg Oral Daily  ? famotidine  40 mg Oral Daily  ? furosemide  80 mg Oral Daily  ? heparin  5,000 Units Subcutaneous Q8H  ? levothyroxine  137 mcg Oral QAC breakfast  ? polyethylene glycol  17 g Oral Daily  ? rosuvastatin  10 mg Oral Daily  ? senna  2 tablet Oral BID  ? traZODone  100 mg Oral QHS  ? ? ? ?Dialysis Orders:  ?MWF - [place] ([place]) ?4hrs57min, BFR 350, DFR 800,  EDW 121 kg, 2K/ 2Ca ?Heparin 3700 units IV ?No ESA-last Hgb 12.2 in OP HD center ?Hectorol 80mcg IV qHD-last 03/07/21 ?Sensipar 90mg  with HD-last 03/07/21 ?Phoslo 667mg  4 capsules with meals and 3 with snacks ?  ?Assessment/Plan: ?E Coli Bacteremia-on IV Ceftriaxone. Considering TTE. Per primary.   ?Chronic R 2/2 to chronic nephrolithiasis. Pain management per primary.  ?ESRD - on HD. HD today off schedule. Fortunately able to get back on MWF schedule today. Next HD 03/14/2021.  ?Hypertension/volume  - Trace peripheral edema. BP stable on midodrine. UF as tolerated. EDW raised 03/07/2020.   ?Anemia of CKD - now 9.6, was 12 in outpatient. Will start ESA here. Hold Fe for now given current infection ?Secondary Hyperparathyroidism - Corrected Ca+ 9.1 PO4 3.8. Previously on high dose calcium acetate. Binders not ordered on admission. PO4 has dropped from 6.0 03/09 to 3.8 today on renal  diet alone. Continue to hold binders and follow trends. Patient is aware of this information as it speaks to his OP compliance with meds/diet. Hopefully he will be more compliant in future as diet alone has lowered his PO4. Will need to resume binders on discharge. Continue Hectorol and sensipar.  ?Nutrition - Renal diet with fluid restriction. Will start protein supplements ?Severe AS-followed by Dr. Angelena Form. ?9.    Issues with TDC: Chronic issue but last access available for this patient. TDC MUST be packed with cath flo  q treatment.  ? ?[name]. Saket Hellstrom NP-C ?03/13/2021, 10:08 AM  ?[facility] ?[phone] ? ? ?  ? ?

## 2021-03-13 NOTE — Progress Notes (Signed)
? ?Subjective: No acute overnight events. ? ?Doing well today. States that he had some back pain after taking deep breaths earlier, it has improved but not completely gone away when taking deep breaths. Pain is on right side and not in spine. Positional changes do not exacerbate it. Discussed using heating pad first and can readdress if not resolved. He is in agreement. ? ?Discussed knee imaging findings. Discussed using voltaren gel to help with soreness, which he is agreeable with. ? ?Objective: ? ?Vital signs in last 24 hours: ?Vitals:  ? 03/12/21 1630 03/12/21 2049 03/13/21 0517 03/13/21 1018  ?BP: (!) 128/48 126/62 (!) 113/46 (!) 110/38  ?Pulse: 64 65 64 65  ?Resp: 18 20 16 18   ?Temp: 98.1 ?F (36.7 ?C) 99.2 ?F (37.3 ?C) 98.6 ?F (37 ?C) 97.9 ?F (36.6 ?C)  ?TempSrc: Oral Oral Oral Oral  ?SpO2: 98% 98% 97% 95%  ?Weight:      ?Height:      ? ?Physical Exam: ?General: chronically ill-appearing male, lying in bed, NAD. ?CV: IV/VI systolic murmur appreciated, normal rate and regular rhythm. ?Pulm: normal work of breathing on RA. ?Skin: warm and dry. ?MSK: L knee TTP, decreased ROM. No swelling, erythema, or warmth noted. ?Neuro: AAOx3. ?Psych: normal mood and affect. ? ?Assessment/Plan: ? ?Principal Problem: ?  Bacteremia ?Active Problems: ?  Nonhealing surgical wound ?  ESRD (end stage renal disease) on dialysis [facility]) ?  Fever ?  Bilateral flank pain ?  Bilateral kidney stones ?  Sepsis ([place]) ?  Hypotension ? ?Patient Summary: ?[name] is a 56 y.o. male with PMHx ESRD on HD T/TH/S, hypothyroidism, and chronic pain, initially presenting from HD for nausea, bilateral flank pain, and hypotension who was then admitted to the ICU for management of an arterial line and hypotension 2/2 severe sepsis from E coli bacteremia, now out of the ICU with management of pain, constipation, and BP. ?  ?Hypotension secondary to E.Coli Bacteremia, likely source of infection is urinary track versus femoral HD Catheter ?Patient  reports prior history of bacteremia with suspected source of femoral HD catheter; when cultures drawn from catheter, negative. Pain is well controlled on current regimen. ?- Midodrine d/c per patient request d/t HA.  ?- Per sensitivities, adjusted abx to Cefazolin with HD. Day 6 of 14.  ?  ?ESRD on T/TH/S HD ?Hyperkalemia, resolved ?Managed per nephro, appreciate assistance.  ?  ?Chronic Pain Syndrome ?Chronic L knee pain ?Patient on chronic opioids for spina bifida and renal stones from chart review. PDMP shows long term prescription of oxycodone-acetaminophen 10-325 QID. Patient reports hx of septic arthritis in L knee and significant injury to R knee. 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 ?-Tylenol scheduled for mild pain ?-Dilaudid 0.5 mg every 4 hours PRN moderate to severe pain ?- Continue home Percocet 10-325 QID PRN moderate pain ?-- Senna 2 tablets BID and Miralax daily added for constipation, which may be 2/2 pain medication regimen. ?  ?Severe aortic Stenosis ?-Continue patient's Plavix, ASA, Crestor ?-Has outpatient follow-up scheduled 3/15 with cardiologist Dr. Angelena Form ?  ?Thrombocytopenia ?Leukopenia ?WBC and platelet count stable and gradually improving. ?-We will continue to trend with daily CBC; this is acute change in setting of sepsis. Expect cell counts to gradually improve.  ?  ?Anemia of Chronic Disease ?Hemoglobin stable ?-Patient being given ESA by nephrology ?- Hgb 7-10 on review for past 2 months ?-Continue to trend with daily CBC ?  ? ?Prior to  Admission Living Arrangement: Home ?Anticipated Discharge Location: TBD ?Barriers to Discharge: continued medical management ?Dispo: Anticipated discharge in approximately 1-2 day(s).  ? ?Darrell Axe, MD ?03/13/2021, 12:29 PM ?Pager: [phone] ?After 5pm on weekdays and 1pm on weekends: On Call pager [phone]  ?

## 2021-03-13 NOTE — Evaluation (Signed)
Occupational Therapy Evaluation Patient Details Name: [name] MRN: [id number] DOB: May 30, 1965 Today's Date: 03/13/2021   History of Present Illness 56 y/o male presented to ED on 03/07/21 from dialysis for nausea, R flank pain, and hypotension. Admitted for sepsis workup. Found to have +E Coli. CT abdomen negative. CXR negative. PMH: ESRD, seizure disorder, HTN, aortic stenosis, spina bifida, anemia, obesity, anxiety   Clinical Impression   Pt reports using RW for mobility, sponge bathes at baseline due to HD catheter, however is independent with all other ADLs. Pt min -mod A for ADLs, supervision for bed mobility, and min guard for squat pivot transfer to [facility]. Pt declining in room ambulation due to pain at this time. Pt frustrated stating he does not need OT, has had prior bad experiences with a HHOT in the past, however moves impulsively, and is not safe with squat pivot transfers. Pt able to don socks sitting EOB, however seemed interested in trialing use of sock aid. Pt requiring increased cuing for safety throughout session. Pt presenting with impairments listed below, will follow acutely. Recommend HHOT at d/c.     Recommendations for follow up therapy are one component of a multi-disciplinary discharge planning process, led by the attending physician.  Recommendations may be updated based on patient status, additional functional criteria and insurance authorization.   Follow Up Recommendations  Home health OT    Assistance Recommended at Discharge Set up Supervision/Assistance  Patient can return home with the following A little help with walking and/or transfers;A lot of help with bathing/dressing/bathroom;Assistance with cooking/housework;Assist for transportation;Help with stairs or ramp for entrance    Functional Status Assessment  Patient has had a recent decline in their functional status and demonstrates the ability to make significant improvements in function in a  reasonable and predictable amount of time.  Equipment Recommendations  BSC/3in1    Recommendations for Other Services       Precautions / Restrictions Precautions Precautions: Fall Precaution Comments: perm R femoral HD catheter Restrictions Weight Bearing Restrictions: No      Mobility Bed Mobility Overal bed mobility: Needs Assistance Bed Mobility: Sidelying to Sit, Sit to Sidelying   Sidelying to sit: Supervision     Sit to sidelying: Supervision      Transfers Overall transfer level: Needs assistance Equipment used: 1 person hand held assist Transfers: Bed to chair/wheelchair/BSC, Sit to/from Stand Sit to Stand: Min guard           General transfer comment: min guard from regular bed height      Balance Overall balance assessment: Needs assistance Sitting-balance support: No upper extremity supported, Feet supported Sitting balance-Leahy Scale: Fair     Standing balance support: Bilateral upper extremity supported, Reliant on assistive device for balance Standing balance-Leahy Scale: Poor                             ADL either performed or assessed with clinical judgement   ADL Overall ADL's : Needs assistance/impaired Eating/Feeding: Set up;Sitting   Grooming: Set up;Sitting   Upper Body Bathing: Minimal assistance;Sitting   Lower Body Bathing: Moderate assistance;Sitting/lateral leans   Upper Body Dressing : Minimal assistance;Sitting   Lower Body Dressing: Moderate assistance;Sitting/lateral leans Lower Body Dressing Details (indicate cue type and reason): dons socks with figure 4 Toilet Transfer: Minimal assistance;Squat-pivot;BSC/3in1 Toilet Transfer Details (indicate cue type and reason): pt with forward lean, cues to steady self, however does not respond well to cues regarding  safety Toileting- Clothing Manipulation and Hygiene: Supervision/safety;Sitting/lateral lean Toileting - Clothing Manipulation Details (indicate cue  type and reason): completes pericare     Functional mobility during ADLs: Minimal assistance       Vision Baseline Vision/History: 1 Wears glasses Vision Assessment?: No apparent visual deficits     Perception     Praxis      Pertinent Vitals/Pain Pain Assessment Pain Assessment: Faces Pain Score: 9  Pain Location: abdomen and back Pain Descriptors / Indicators: Aching, Discomfort, Grimacing, Guarding, Sore Pain Intervention(s): Limited activity within patient's tolerance, Monitored during session, Repositioned     Hand Dominance Right   Extremity/Trunk Assessment Upper Extremity Assessment Upper Extremity Assessment: RUE deficits/detail;LUE deficits/detail RUE Deficits / Details: reports numbness/tingling in digits 1-3 LUE Deficits / Details: reports numbness/tingling in digits 1-3   Lower Extremity Assessment Lower Extremity Assessment: Generalized weakness   Cervical / Trunk Assessment Cervical / Trunk Assessment: Kyphotic   Communication Communication Communication: No difficulties   Cognition Arousal/Alertness: Awake/alert Behavior During Therapy: Restless, Agitated, Impulsive Overall Cognitive Status: Within Functional Limits for tasks assessed                                       General Comments  noted blood on toilet tissue, RN notified    Exercises     Shoulder Instructions      Home Living Family/patient expects to be discharged to:: Private residence Living Arrangements: Spouse/significant other Available Help at Discharge: Family Type of Home: House Home Access: Stairs to enter Technical brewer of Steps: 1 - threshold Entrance Stairs-Rails: None Home Layout: One level     Bathroom Shower/Tub: Sponge bathes at baseline   [employer]: Handicapped height Bathroom Accessibility: Yes How Accessible: Accessible via walker Home Equipment: [place] (2 wheels);Cane - single point;Wheelchair - Education officer, community  - power          Prior Functioning/Environment Prior Level of Function : Independent/Modified Independent             Mobility Comments: uses bariatric RW for mobility and utilizes w/c to attend HD ADLs Comments: sponges bathes at baseline due to femoral HD catheter        OT Problem List: Decreased strength;Decreased range of motion;Decreased activity tolerance;Decreased safety awareness;Decreased knowledge of use of DME or AE;Pain;Impaired balance (sitting and/or standing)      OT Treatment/Interventions: Self-care/ADL training;Therapeutic exercise;Therapeutic activities;Patient/family education;Balance training;DME and/or AE instruction    OT Goals(Current goals can be found in the care plan section) Acute Rehab OT Goals Patient Stated Goal: none stated OT Goal Formulation: With patient Time For Goal Achievement: 03/27/21 Potential to Achieve Goals: Good ADL Goals Pt Will Perform Upper Body Dressing: with supervision;sitting Pt Will Perform Lower Body Dressing: with min assist;sitting/lateral leans;sit to/from stand Pt Will Transfer to Toilet: ambulating;regular height toilet;with min guard assist Pt Will Perform Tub/Shower Transfer: rolling walker;tub bench;Tub transfer;Shower transfer;with min guard assist  OT Frequency: Min 2X/week    Co-evaluation              AM-PAC OT "6 Clicks" Daily Activity     Outcome Measure Help from another person eating meals?: None Help from another person taking care of personal grooming?: A Little Help from another person toileting, which includes using toliet, bedpan, or urinal?: A Little Help from another person bathing (including washing, rinsing, drying)?: A Lot Help from another person to put on and taking off  regular upper body clothing?: A Little Help from another person to put on and taking off regular lower body clothing?: A Lot 6 Click Score: 17   End of Session Nurse Communication: Mobility status;Other (comment)  (confirmed pt can have ice chips)  Activity Tolerance: Treatment limited secondary to agitation Patient left: in bed;with call bell/phone within reach;with bed alarm set  OT Visit Diagnosis: Unsteadiness on feet (R26.81);Other abnormalities of gait and mobility (R26.89);Muscle weakness (generalized) (M62.81)                Time: 3893-7342 OT Time Calculation (min): 32 min Charges:  OT General Charges $OT Visit: 1 Visit OT Evaluation $OT Eval Moderate Complexity: 1 Mod OT Treatments $Self Care/Home Management : 8-22 mins  Lynnda Child, OTD, OTR/L Acute Rehab [phone]) [phone] - [place] 03/13/2021, 11:51 AM

## 2021-03-14 DIAGNOSIS — R7881 Bacteremia: Secondary | ICD-10-CM | POA: Diagnosis not present

## 2021-03-14 DIAGNOSIS — N186 End stage renal disease: Secondary | ICD-10-CM | POA: Diagnosis not present

## 2021-03-14 DIAGNOSIS — B9620 Unspecified Escherichia coli [E. coli] as the cause of diseases classified elsewhere: Secondary | ICD-10-CM | POA: Diagnosis not present

## 2021-03-14 DIAGNOSIS — G894 Chronic pain syndrome: Secondary | ICD-10-CM

## 2021-03-14 DIAGNOSIS — I350 Nonrheumatic aortic (valve) stenosis: Secondary | ICD-10-CM

## 2021-03-14 DIAGNOSIS — D696 Thrombocytopenia, unspecified: Secondary | ICD-10-CM

## 2021-03-14 DIAGNOSIS — D72829 Elevated white blood cell count, unspecified: Secondary | ICD-10-CM

## 2021-03-14 DIAGNOSIS — I959 Hypotension, unspecified: Secondary | ICD-10-CM | POA: Diagnosis not present

## 2021-03-14 DIAGNOSIS — D638 Anemia in other chronic diseases classified elsewhere: Secondary | ICD-10-CM

## 2021-03-14 DIAGNOSIS — M25562 Pain in left knee: Secondary | ICD-10-CM

## 2021-03-14 LAB — CBC
HCT: 32 % — ABNORMAL LOW (ref 39.0–52.0)
Hemoglobin: 9.8 g/dL — ABNORMAL LOW (ref 13.0–17.0)
MCH: 30.3 pg (ref 26.0–34.0)
MCHC: 30.6 g/dL (ref 30.0–36.0)
MCV: 99.1 fL (ref 80.0–100.0)
Platelets: 120 10*3/uL — ABNORMAL LOW (ref 150–400)
RBC: 3.23 MIL/uL — ABNORMAL LOW (ref 4.22–5.81)
RDW: 17.2 % — ABNORMAL HIGH (ref 11.5–15.5)
WBC: 5.1 10*3/uL (ref 4.0–10.5)
nRBC: 0 % (ref 0.0–0.2)

## 2021-03-14 LAB — CULTURE, BLOOD (ROUTINE X 2)
Culture: NO GROWTH
Culture: NO GROWTH
Special Requests: ADEQUATE

## 2021-03-14 LAB — CBC WITH DIFFERENTIAL/PLATELET
Abs Immature Granulocytes: 0.36 10*3/uL — ABNORMAL HIGH (ref 0.00–0.07)
Basophils Absolute: 0 10*3/uL (ref 0.0–0.1)
Basophils Relative: 1 %
Eosinophils Absolute: 0.1 10*3/uL (ref 0.0–0.5)
Eosinophils Relative: 3 %
HCT: 30.6 % — ABNORMAL LOW (ref 39.0–52.0)
Hemoglobin: 9.4 g/dL — ABNORMAL LOW (ref 13.0–17.0)
Immature Granulocytes: 8 %
Lymphocytes Relative: 17 %
Lymphs Abs: 0.8 10*3/uL (ref 0.7–4.0)
MCH: 29.7 pg (ref 26.0–34.0)
MCHC: 30.7 g/dL (ref 30.0–36.0)
MCV: 96.5 fL (ref 80.0–100.0)
Monocytes Absolute: 0.5 10*3/uL (ref 0.1–1.0)
Monocytes Relative: 11 %
Neutro Abs: 2.8 10*3/uL (ref 1.7–7.7)
Neutrophils Relative %: 60 %
Platelets: 106 10*3/uL — ABNORMAL LOW (ref 150–400)
RBC: 3.17 MIL/uL — ABNORMAL LOW (ref 4.22–5.81)
RDW: 17 % — ABNORMAL HIGH (ref 11.5–15.5)
Smear Review: DECREASED
WBC: 4.7 10*3/uL (ref 4.0–10.5)
nRBC: 0.6 % — ABNORMAL HIGH (ref 0.0–0.2)

## 2021-03-14 LAB — RENAL FUNCTION PANEL
Albumin: 3.1 g/dL — ABNORMAL LOW (ref 3.5–5.0)
Anion gap: 14 (ref 5–15)
BUN: 78 mg/dL — ABNORMAL HIGH (ref 6–20)
CO2: 26 mmol/L (ref 22–32)
Calcium: 9 mg/dL (ref 8.9–10.3)
Chloride: 97 mmol/L — ABNORMAL LOW (ref 98–111)
Creatinine, Ser: 12.58 mg/dL — ABNORMAL HIGH (ref 0.61–1.24)
GFR, Estimated: 4 mL/min — ABNORMAL LOW (ref >60)
Glucose, Bld: 97 mg/dL (ref 70–99)
Phosphorus: 5 mg/dL — ABNORMAL HIGH (ref 2.5–4.6)
Potassium: 5.3 mmol/L — ABNORMAL HIGH (ref 3.5–5.1)
Sodium: 137 mmol/L (ref 135–145)

## 2021-03-14 MED ORDER — PENTAFLUOROPROP-TETRAFLUOROETH EX AERO: 1.0000 "application " | INHALATION_SPRAY | CUTANEOUS | Status: DC | PRN

## 2021-03-14 MED ORDER — LIDOCAINE-PRILOCAINE 2.5-2.5 % EX CREA
1.0000 "application " | TOPICAL_CREAM | CUTANEOUS | Status: DC | PRN
  Filled 2021-03-14: qty 5

## 2021-03-14 MED ORDER — SODIUM CHLORIDE 0.9 % IV SOLN: 100.0000 mL | INTRAVENOUS | Status: DC | PRN

## 2021-03-14 MED ORDER — ALTEPLASE 2 MG IJ SOLR
2.0000 mg | Freq: Once | INTRAMUSCULAR | Status: DC | PRN
  Filled 2021-03-14: qty 2

## 2021-03-14 MED ORDER — HEPARIN SODIUM (PORCINE) 1000 UNIT/ML DIALYSIS: 1000.0000 [IU] | INTRAMUSCULAR | Status: DC | PRN

## 2021-03-14 MED ORDER — MUSCLE RUB 10-15 % EX CREA
TOPICAL_CREAM | CUTANEOUS | Status: DC | PRN
  Filled 2021-03-14 (×2): qty 85

## 2021-03-14 MED ORDER — DARBEPOETIN ALFA 40 MCG/0.4ML IJ SOSY
40.0000 ug | PREFILLED_SYRINGE | INTRAMUSCULAR | Status: DC
  Administered 2021-03-14: 40 ug via INTRAVENOUS
  Filled 2021-03-14: qty 0.4

## 2021-03-14 MED ORDER — ALTEPLASE 2 MG IJ SOLR
2.0000 mg | Freq: Once | INTRAMUSCULAR | Status: AC
  Administered 2021-03-16: 2 mg
  Filled 2021-03-14: qty 2

## 2021-03-14 MED ORDER — HEPARIN SODIUM (PORCINE) 1000 UNIT/ML DIALYSIS
3700.0000 [IU] | Freq: Once | INTRAMUSCULAR | Status: AC
  Administered 2021-03-14: 3700 [IU] via INTRAVENOUS_CENTRAL
  Filled 2021-03-14: qty 4

## 2021-03-14 MED ORDER — LIDOCAINE HCL (PF) 1 % IJ SOLN: 5.0000 mL | INTRAMUSCULAR | Status: DC | PRN

## 2021-03-14 NOTE — Care Management Important Message (Signed)
Important Message ? ?Patient Details  ?Name: [name] ?MRN: [id number] ?Date of Birth: 12/31/1965 ? ? ?Medicare Important Message Given:  Yes ? ? ? ? ?Staton Markey ?03/14/2021, 4:13 PM ?

## 2021-03-14 NOTE — Progress Notes (Signed)
PT Cancellation Note ? ?Patient Details ?Name: Dalan Cowger ?MRN: [id number] ?DOB: [birth year] ? ? ?Cancelled Treatment:    Reason Eval/Treat Not Completed: Patient at procedure or test/unavailable. Pt in HD. Will continue to follow. ? ? ?Shary Decamp [facility] ?03/14/2021, 11:14 AM ?Suanne Marker PT ?Acute Rehabilitation Services ?Pager [phone] ?Office [phone] ? ? ? ?

## 2021-03-14 NOTE — Progress Notes (Signed)
Subjective: For dialysis today, co of headache,, right knee discomfort recent pain medicine may be helping ? ?Objective ?Vital signs in last 24 hours: ?Vitals:  ? 03/13/21 1700 03/13/21 2011 03/14/21 0453 03/14/21 0800  ?BP: (!) 112/46 (!) 123/49 122/63 126/69  ?Pulse: 66 70 (!) 57 61  ?Resp: 16 17 18 18   ?Temp: 98 ?F (36.7 ?C) 98.8 ?F (37.1 ?C) 97.7 ?F (36.5 ?C) 97.8 ?F (36.6 ?C)  ?TempSrc: Oral Oral Oral Oral  ?SpO2: 96% 97% 97% 97%  ?Weight:      ?Height:      ? ?Weight change:  ? ?Physical Exam: ?General: Alert, chronically ill-appearing male NAD ?Heart: RRR, 2/6 SEM no gallop or rub ?Lungs: CTA nonlabored breathing 2 L [place] ?Abdomen: Obese, NABS, ileostomy in place, minimal tenderness ?Extremities: Trace bipedal edema  ?dialysis Access: Right thigh TDC dressing dry intact ? ? ? ?OP dialysis Orders:  ?MWF - [place] ([place]) ?4hrs1min, BFR 350, DFR 800,  EDW 121 kg, 2K/ 2Ca ?Heparin 3700 units IV ?No ESA-last Hgb 12.2 in OP HD center ?Hectorol 37mcg IV qHD-last 03/07/21 ?Sensipar 90mg  with HD-last 03/07/21 ?Phoslo 667mg  4 capsules with meals and 3 with snacks ?  ? ?Problem/Plan: ?E Coli Bacteremia-on IV Ceftriaxone.  Work-up per primary.  Repeat blood cultures 3/8 no growth to date likely source urinary tract versus femoral HD catheter(has not had chills and fever using PermCath on dialysis) ?Chronic R flank discomfort 2/2 to chronic nephrolithiasis. Pain management per primary.  ?ESRD - on HD MWF. HD today on schedule schedule.  ?Issues with TDC: Chronic issue but last access available for this patient. TDC MUST be packed with cath flo q treatment.   ?Hypertension/volume  - Trace peripheral edema. BP stable on midodrine. UF as tolerated. EDW raised 03/07/2020.   ?Anemia of CKD - now 9.4  was 12 in outpatient/will start ESA today 40 mcg, follow-up Hgb trend, hold Fe for now given current infection ?Secondary Hyperparathyroidism - Corrected Ca+ 9.1 PO4 3.8. Previously on high dose calcium acetate.   No binders ordered on admission. PO4 has dropped from 6.0 03/09 to 3.8  on renal diet alone. Continue to hold binders and follow trends. Patient is aware of this information as it speaks to his OP compliance with meds/diet.  Will need to resume binders on discharge. Continue Hectorol and sensipar.  ?Nutrition - Renal diet with fluid restriction. Will start protein supplements ?Severe AS-followed by Dr. Angelena Form. ? ? ? ?Ernest Haber, PA-C ?[facility] ?Beeper [phone] ?03/14/2021,10:20 AM ? LOS: 7 days  ? ?Labs: ?Basic Metabolic Panel: ?Recent Labs  ?Lab 03/11/21 ?0424 03/12/21 ?0211 03/13/21 ?0101  ?NA 136 138 138  ?K 5.0 4.8 5.1  ?CL 99 98 98  ?CO2 22 29 28   ?GLUCOSE 69* 96 121*  ?BUN 58* 48* 60*  ?CREATININE 9.10* 8.64* 10.09*  ?CALCIUM 8.4* 8.7* 9.0  ?PHOS 4.6 4.1 3.8  ? ?Liver Function Tests: ?Recent Labs  ?Lab 03/07/21 ?1125 03/08/21 ?0631 03/10/21 ?0424 03/11/21 ?0424 03/12/21 ?0211 03/13/21 ?0101  ?AST 16 26  --   --   --   --   ?ALT 11 17  --   --   --   --   ?ALKPHOS 149* 115  --   --   --   --   ?BILITOT 0.5 0.5  --   --   --   --   ?PROT 7.3 6.1*  --   --   --   --   ?  ALBUMIN 3.5 2.7*   < > 2.7* 2.8* 3.1*  ? < > = values in this interval not displayed.  ? ?No results for input(s): LIPASE, AMYLASE in the last 168 hours. ?No results for input(s): AMMONIA in the last 168 hours. ?CBC: ?Recent Labs  ?Lab 03/10/21 ?0424 03/11/21 ?4259 03/12/21 ?0211 03/13/21 ?0101 03/14/21 ?5638  ?WBC 2.7* 3.5* 3.8* 5.2 4.7  ?NEUTROABS 1.9 2.4  --  3.4 2.8  ?HGB 10.1* 9.6* 9.3* 10.1* 9.4*  ?HCT 32.9* 31.9* 30.8* 33.2* 30.6*  ?MCV 97.3 98.2 99.4 98.5 96.5  ?PLT 67* 72* 82* 95* 106*  ? ?Cardiac Enzymes: ?No results for input(s): CKTOTAL, CKMB, CKMBINDEX, TROPONINI in the last 168 hours. ?CBG: ?Recent Labs  ?Lab 03/08/21 ?7564  ?GLUCAP 73  ? ? ?Studies/Results: ?DG Knee 1-2 Views Left ? ?Result Date: 03/12/2021 ?CLINICAL DATA:  Chronic knee pain EXAM: LEFT KNEE - 1-2 VIEW COMPARISON:  10/05/2019 FINDINGS: Mild medial  compartmental articular space narrowing. Regional atherosclerotic vascular calcifications. The patella appears substantially abnormal and elongated, measuring 7.6 cm craniocaudad, with a scalloped posterior margin and posterior marginal irregularity, as well as some mild lucency in the upper portion of the patella. This elongation could be from a prior fracture which has healed, or extensive proliferative osteophyte formation along the inferior patella. There is moderate wavy irregularity of the femoral trochlear groove. Mild subcutaneous edema anterior to the patellar tendon. No overt effusion in the suprapatellar bursa. IMPRESSION: 1. Abnormal elongated patella, possibly due to a prior fracture which has healed with the patella elongated, or prominent proliferative bony spurring along the inferior patella causing patellar lengthening. There is posterior patellar irregularity and some mild wavy irregularity along the femoral trochlear groove. 2. Mild subcutaneous edema anterior to the patellar tendon. 3. Mild medial compartmental articular space narrowing. 4. Atherosclerosis. Electronically Signed   By: Van Clines M.D.   On: 03/12/2021 14:28   ?Medications: ? sodium chloride    ? sodium chloride    ?  ceFAZolin (ANCEF) IV 2 g (03/12/21 1137)  ? promethazine (PHENERGAN) injection (IM or IVPB) 12.5 mg (03/11/21 1110)  ? ? (feeding supplement) PROSource Plus  30 mL Oral TID BM  ? acetaminophen  1,000 mg Oral TID  ? aspirin EC  81 mg Oral Daily  ? Chlorhexidine Gluconate Cloth  6 each Topical Q0600  ? Chlorhexidine Gluconate Cloth  6 each Topical Q0600  ? clopidogrel  75 mg Oral Daily  ? doxercalciferol  3 mcg Intravenous Q M,W,F-HD  ? escitalopram  10 mg Oral Daily  ? famotidine  40 mg Oral Daily  ? furosemide  80 mg Oral Daily  ? heparin  3,700 Units Dialysis Once in dialysis  ? heparin  5,000 Units Subcutaneous Q12H  ? levothyroxine  137 mcg Oral QAC breakfast  ? polyethylene glycol  17 g Oral Daily  ?  rosuvastatin  10 mg Oral Daily  ? senna  2 tablet Oral BID  ? traZODone  100 mg Oral QHS  ? ? ? ? ?

## 2021-03-14 NOTE — Progress Notes (Signed)
? ?Subjective: No acute overnight events. ? ?Reports significant pain today. States that [name] had some back pain after taking deep breaths since yesterday; the pain radiates caudally from just above right-sided nephrostomy tube wound to posterolateral neck into mastoid.  [name] also describes severe itching/burning pain in a dermatomal distribution just above right nephrostomy tube wound and along his left jawline.  As well, [name] reports bilateral retro-orbital headache with photophobia and pain with horizontal EOM but no aura.  Additionally, [name] describes seeing red blood vessels in front of his eyes but denies all other visual disturbances.  Patient denies history of herpes and shingles; reports history of chickenpox. ? ?[name] reports an episode of lightheadedness after bowel movement last night. ? ? ?Objective: ? ?Vital signs in last 24 hours: ?Vitals:  ? 03/14/21 1200 03/14/21 1230 03/14/21 1300 03/14/21 1330  ?BP: (!) 115/55 (!) 115/55 (!) 92/43 (!) 99/51  ?Pulse: 70 70 (!) 57 (!) 56  ?Resp: 15 14 13 13   ?Temp:      ?TempSrc:      ?SpO2:      ?Weight:      ?Height:      ? ?Physical Exam: ?General: chronically ill-appearing male, lying in bed, in AD 2/2 pain. ?HEENT: Normocephalic, atraumatic.  Eyes with pain noted with horizontal EOM.  No pain with vertical EOM.  Bilateral conjunctiva without injection nor purulence; clear sclerae. No tearing, no signs of periorbital cellulitis (no swelling, erythema). ?Pulm: normal work of breathing on RA. ?Skin: warm and dry. L jawline with no erythema, swelling, no lymphadenopathy.  Patient describes numbness with palpation.  Patient in HD, so unable to lean forward to assess back due to risk of losing femoral catheter access. ?Neuro: AAOx3. ?Psych: low mood and affect. ? ?Assessment/Plan: ? ?Principal Problem: ?  Bacteremia ?Active Problems: ?  Nonhealing surgical wound ?  ESRD (end stage renal disease) on dialysis [facility]) ?  Fever ?  Bilateral flank pain ?  Bilateral kidney stones ?   Sepsis ([place] [facility]) ?  Hypotension ? ?Patient Summary: ?[name] is a 56 y.o. male with PMHx ESRD on HD T/TH/S, hypothyroidism, and chronic pain, initially presenting from HD for nausea, bilateral flank pain, and hypotension who was then admitted to the ICU for management of an arterial line and hypotension 2/2 severe sepsis from E coli bacteremia, now out of the ICU with management of pain, constipation, and BP. ?  ?Hypotension secondary to E.Coli Bacteremia, likely source of infection is urinary track versus femoral HD Catheter ?Patient reports prior history of bacteremia with suspected source of femoral HD catheter; when cultures drawn from catheter, negative. ?- Midodrine d/c per patient request d/t HA.  ?- Per sensitivities, adjusted abx to Cefazolin with HD. Day 6 of 14.  ?  ?ESRD on T/TH/S HD ?Hyperkalemia, resolved ?Managed per nephro, appreciate assistance.  ?  ?Acute Back Pain and HA on Chronic Pain Syndrome ?Chronic L knee pain ?Patient on chronic opioids for spina bifida and renal stones from chart review. PDMP shows long term prescription of oxycodone-acetaminophen 10-325 QID. 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 Patient with new retro-orbital HA and severely itching right back and left jaw pain described today; prior pain reported well managed on current regimen. ?- We will give heating pad and muscle rub cream for back pain, cold pack and Tylenol for eye pain, and advise to keep lights low for photophobia; will reassess. ?-Tylenol scheduled for mild pain ?-Dilaudid 0.5 mg every  4 hours PRN moderate to severe pain ?- Continue home Percocet 10-325 QID PRN moderate pain ?-- Continue Senna 2 tablets BID and Miralax daily for constipation, which may be 2/2 pain medication regimen. ?  ?Severe aortic Stenosis ?-Continue patient's Plavix, ASA, Crestor ?-Has outpatient follow-up scheduled 3/15 with cardiologist Dr. Angelena Form ?   ?Thrombocytopenia ?Leukopenia, resolved ?WBC and platelet count stable and gradually improving. ?-We will continue to trend with daily CBC; this is acute change in setting of sepsis. Expect cell counts to gradually improve.  ?  ?Anemia of Chronic Disease ?Hemoglobin stable ?-Patient being given ESA by nephrology ?- Hgb 7-10 on review for past 2 months ?-Continue to trend with daily CBC ?  ? ?Prior to Admission Living Arrangement: Home ?Anticipated Discharge Location: TBD ?Barriers to Discharge: continued medical management ?Dispo: Anticipated discharge in approximately 1-2 day(s).  ? ?Rosezetta Schlatter, MD ?03/14/2021, 1:53 PM ?Pager: [phone] ?After 5pm on weekdays and 1pm on weekends: On Call pager [phone]  ?

## 2021-03-14 NOTE — Progress Notes (Signed)
PT Cancellation Note ? ?Patient Details ?Name: [name] ?MRN: [id number] ?DOB: March 20, 1965 ? ? ?Cancelled Treatment:    Reason Eval/Treat Not Completed: Patient at procedure or test/unavailable. Pt still in HD. Will continue to follow. ? ? ?Shary Decamp [facility] ?03/14/2021, 2:23 PM ?[facility] PT ?Acute Rehabilitation Services ?Pager [phone] ?Office [phone] ? ? ? ?

## 2021-03-15 ENCOUNTER — Encounter: Payer: Self-pay | Admitting: Cardiovascular Disease

## 2021-03-15 MED ORDER — DIPHENHYDRAMINE-ZINC ACETATE 2-0.1 % EX CREA
TOPICAL_CREAM | Freq: Two times a day (BID) | CUTANEOUS | Status: DC | PRN
  Filled 2021-03-15: qty 28

## 2021-03-15 MED ORDER — LIDOCAINE 5 % EX PTCH
1.0000 | MEDICATED_PATCH | CUTANEOUS | Status: DC
  Administered 2021-03-15: 1 via TRANSDERMAL
  Filled 2021-03-15: qty 1

## 2021-03-15 NOTE — Progress Notes (Addendum)
Subjective: Said tolerated dialysis yesterday no fevers chills while on dialysis.  Currently sitting up in chair participating with PT, no current complaints feels better than yesterday ? ?Objective ?Vital signs in last 24 hours: ?Vitals:  ? 03/14/21 1834 03/14/21 2114 03/15/21 0523 03/15/21 0827  ?BP: (!) 105/43 (!) 116/49 116/68 (!) 116/56  ?Pulse: 65 64 (!) 57 (!) 59  ?Resp: 18 15 19 17   ?Temp: 99 ?F (37.2 ?C) 99.3 ?F (37.4 ?C) 98.4 ?F (36.9 ?C) 98.4 ?F (36.9 ?C)  ?TempSrc: Oral Oral Oral Oral  ?SpO2: 99% 99% 99% 97%  ?Weight:      ?Height:      ? ?Weight change:  ? ?Physical Exam: ?General: Alert, chronically ill-appearing male NAD ?Heart: RRR, 2/6 SEM no gallop or rub ?Lungs: CTA nonlabored breathing 2 L [place] ?Abdomen: Obese, NABS, ileostomy in place, tenderness decreasing yesterday no distention ?Extremities: Trace bipedal edema  ?dialysis Access: Right thigh TDC dressing dry intact ?  ?  ?  ?OP dialysis Orders:  ?MWF - [place] ([place]) ?4hrs23min, BFR 350, DFR 800,  EDW 121 kg, 2K/ 2Ca ?Heparin 3700 units IV ?No ESA-last Hgb 12.2 in OP HD center ?Hectorol 42mcg IV qHD-last 03/07/21 ?Sensipar 90mg  with HD-last 03/07/21 ?Phoslo 667mg  4 capsules with meals and 3 with snacks ?  ?  ?Problem/Plan: ?E Coli Bacteremia-on IV Ceftriaxone.  Work-up per primary.  Repeat blood cultures 3/8 no growth to date likely source urinary tract versus femoral HD catheter(has not had chills and fever using PermCath on dialysis) ?Chronic R flank discomfort 2/2 to chronic nephrolithiasis. Pain management per primary.  ?ESRD - on HD MWF. HD on schedule schedule. (History of recurrent hyperkalemia on p.o. Kayexalate ?At home) ?Issues with TDC: Chronic issue but last access available for this patient. TDC MUST be packed with cath flo q treatment.  No issues with PermCath yesterday on dialysis ?Hypertension/volume  -BP stable this a.m. 116/56, trace peripheral edema. BP stable on midodrine. UF as tolerated. EDW raised  03/07/2020.   ?Anemia of CKD -Hgb 9.8 was 12 in outpatient/will started on  ESA 3/13 HD 40 mcg, follow-up Hgb trend, hold Fe for now given current infection ?Secondary Hyperparathyroidism - Corrected Ca+ 9.1 PO4 5.0 previously on high dose calcium acetate.  No binders ordered on admission. follow trends. Patient is aware of this information as it speaks to his OP compliance with meds/diet.  Will need to resume binders on discharge. Continue Hectorol and sensipar.  ?Nutrition - Renal diet with fluid restriction. Will start protein supplements ?Severe AS-followed by Dr. Angelena Form. ? ?Ernest Haber, PA-C ?[facility] ?Beeper [phone] ?03/15/2021,12:03 PM ? LOS: 8 days  ? ?Labs: ?Basic Metabolic Panel: ?Recent Labs  ?Lab 03/12/21 ?0211 03/13/21 ?0101 03/14/21 ?1119  ?NA 138 138 137  ?K 4.8 5.1 5.3*  ?CL 98 98 97*  ?CO2 29 28 26   ?GLUCOSE 96 121* 97  ?BUN 48* 60* 78*  ?CREATININE 8.64* 10.09* 12.58*  ?CALCIUM 8.7* 9.0 9.0  ?PHOS 4.1 3.8 5.0*  ? ?Liver Function Tests: ?Recent Labs  ?Lab 03/12/21 ?0211 03/13/21 ?0101 03/14/21 ?1119  ?ALBUMIN 2.8* 3.1* 3.1*  ? ?No results for input(s): LIPASE, AMYLASE in the last 168 hours. ?No results for input(s): AMMONIA in the last 168 hours. ?CBC: ?Recent Labs  ?Lab 03/11/21 ?0744 03/12/21 ?0211 03/13/21 ?0101 03/14/21 ?0454 03/14/21 ?1120  ?WBC 3.5* 3.8* 5.2 4.7 5.1  ?NEUTROABS 2.4  --  3.4 2.8  --   ?HGB 9.6* 9.3* 10.1* 9.4* 9.8*  ?  HCT 31.9* 30.8* 33.2* 30.6* 32.0*  ?MCV 98.2 99.4 98.5 96.5 99.1  ?PLT 72* 82* 95* 106* 120*  ? ?Cardiac Enzymes: ?No results for input(s): CKTOTAL, CKMB, CKMBINDEX, TROPONINI in the last 168 hours. ?CBG: ?No results for input(s): GLUCAP in the last 168 hours. ? ?Studies/Results: ?No results found. ?Medications: ?  ceFAZolin (ANCEF) IV 2 g (03/14/21 1734)  ? ? (feeding supplement) PROSource Plus  30 mL Oral TID BM  ? acetaminophen  1,000 mg Oral TID  ? alteplase  2 mg Intracatheter Once  ? aspirin EC  81 mg Oral Daily  ? Chlorhexidine  Gluconate Cloth  6 each Topical Q0600  ? clopidogrel  75 mg Oral Daily  ? darbepoetin (ARANESP) injection - DIALYSIS  40 mcg Intravenous Q Mon-HD  ? doxercalciferol  3 mcg Intravenous Q M,W,F-HD  ? escitalopram  10 mg Oral Daily  ? famotidine  40 mg Oral Daily  ? furosemide  80 mg Oral Daily  ? heparin  5,000 Units Subcutaneous Q12H  ? levothyroxine  137 mcg Oral QAC breakfast  ? lidocaine  1 patch Transdermal Q24H  ? polyethylene glycol  17 g Oral Daily  ? rosuvastatin  10 mg Oral Daily  ? senna  2 tablet Oral BID  ? traZODone  100 mg Oral QHS  ? ? ? ? ?

## 2021-03-15 NOTE — Progress Notes (Signed)
Physical Therapy Treatment ?Patient Details ?Name: [name] ?MRN: [id number] ?DOB: 05/12/1965 ?Today's Date: 03/15/2021 ? ? ?History of Present Illness 56 y/o male presented to ED on 03/07/21 from dialysis for nausea, R flank pain, and hypotension. Admitted for sepsis workup. Found to have +E Coli. CT abdomen negative. CXR negative. PMH: ESRD, seizure disorder, HTN, aortic stenosis, spina bifida, anemia, obesity, anxiety ? ?  ?PT Comments  ? ? Pt making steady progress with mobility. Pt's gait looks awkward but likely this is close to his baseline due to his multiple musculoskeletal issues. Pt has his home set up the way he feels best suits his needs. Continue to recommend return home with [facility] services.    ?Recommendations for follow up therapy are one component of a multi-disciplinary discharge planning process, led by the attending physician.  Recommendations may be updated based on patient status, additional functional criteria and insurance authorization. ? ?Follow Up Recommendations ? Home health PT ?  ?  ?Assistance Recommended at Discharge Set up Supervision/Assistance  ?Patient can return home with the following Help with stairs or ramp for entrance ?  ?Equipment Recommendations ? None recommended by PT (Pt has equipment)  ?  ?Recommendations for Other Services   ? ? ?  ?Precautions / Restrictions Precautions ?Precautions: Fall ?Precaution Comments: perm R femoral HD catheter ?Restrictions ?Weight Bearing Restrictions: No  ?  ? ?Mobility ? Bed Mobility ?  ?  ?  ?  ?  ?  ?  ?General bed mobility comments: Pt up in chair ?  ? ?Transfers ?Overall transfer level: Needs assistance ?Equipment used: Rolling walker (2 wheels) ?Transfers: Sit to/from Stand ?Sit to Stand: Min guard ?  ?  ?  ?  ?  ?General transfer comment: Pt able to rise from low recliner with min guard for safety - no hands on assist ?  ? ?Ambulation/Gait ?Ambulation/Gait assistance: Min guard ?Gait Distance (Feet): 24 Feet ?Assistive device:  Rolling walker (2 wheels) ?Gait Pattern/deviations: Step-through pattern, Trendelenburg, Wide base of support, Knee flexed in stance - left, Knee flexed in stance - right ?Gait velocity: decr ?Gait velocity interpretation: <1.31 ft/sec, indicative of household ambulator ?  ?General Gait Details: Assist for safety. No overt loss of balance ? ? ?Stairs ?  ?  ?  ?  ?  ? ? ?Wheelchair Mobility ?  ? ?Modified Rankin (Stroke Patients Only) ?  ? ? ?  ?Balance Overall balance assessment: Needs assistance ?Sitting-balance support: No upper extremity supported, Feet supported ?Sitting balance-Leahy Scale: Fair ?  ?  ?Standing balance support: Bilateral upper extremity supported, Reliant on assistive device for balance ?Standing balance-Leahy Scale: Poor ?Standing balance comment: walker and min guard for static standing ?  ?  ?  ?  ?  ?  ?  ?  ?  ?  ?  ?  ? ?  ?Cognition Arousal/Alertness: Awake/alert ?Behavior During Therapy: Impulsive, WFL for tasks assessed/performed ?Overall Cognitive Status: Within Functional Limits for tasks assessed ?  ?  ?  ?  ?  ?  ?  ?  ?  ?  ?  ?  ?  ?  ?  ?  ?  ?  ?  ? ?  ?Exercises   ? ?  ?General Comments General comments (skin integrity, edema, etc.): pt not receptive to safety cues during transfer, particular in how he does stand pivot transfers to chair ?  ?  ? ?Pertinent Vitals/Pain Pain Assessment ?Pain Assessment: Faces ?Faces Pain Scale: Hurts even more ?  Pain Location: abdomen and back ?Pain Descriptors / Indicators: Grimacing, Guarding, Sore ?Pain Intervention(s): Limited activity within patient's tolerance, Other (comment) (RN placed lidocainepatch on back)  ? ? ?Home Living   ?  ?  ?  ?  ?  ?  ?  ?  ?  ?   ?  ?Prior Function    ?  ?  ?   ? ?PT Goals (current goals can now be found in the care plan section) Progress towards PT goals: Progressing toward goals ? ?  ?Frequency ? ? ? Min 3X/week ? ? ? ?  ?PT Plan Current plan remains appropriate  ? ? ?Co-evaluation   ?  ?  ?  ?  ? ?   ?AM-PAC PT "6 Clicks" Mobility   ?Outcome Measure ? Help needed turning from your back to your side while in a flat bed without using bedrails?: None ?Help needed moving from lying on your back to sitting on the side of a flat bed without using bedrails?: None ?Help needed moving to and from a bed to a chair (including a wheelchair)?: A Little ?Help needed standing up from a chair using your arms (e.g., wheelchair or bedside chair)?: A Little ?Help needed to walk in hospital room?: A Little ?Help needed climbing 3-5 steps with a railing? : A Lot ?6 Click Score: 19 ? ?  ?End of Session   ?Activity Tolerance: Patient tolerated treatment well ?Patient left: in chair;with call bell/phone within reach;with chair alarm set ?Nurse Communication: Mobility status ?PT Visit Diagnosis: Unsteadiness on feet (R26.81);Muscle weakness (generalized) (M62.81);Difficulty in walking, not elsewhere classified (R26.2) ?  ? ? ?Time: 6286-3817 ?PT Time Calculation (min) (ACUTE ONLY): 10 min ? ?Charges:  $Gait Training: 8-22 mins          ?          ? ?[facility] PT ?Acute Rehabilitation Services ?Pager [phone] ?Office [phone] ? ? ? ?Shary Decamp [facility] ?03/15/2021, 1:18 PM ? ?

## 2021-03-15 NOTE — Progress Notes (Signed)
? ?Subjective: No acute overnight events. ? ?Reports improvements in pain today. Although, the severe itching/burning pain in a dermatomal distribution just above right nephrostomy tube wound and along his left jawline remains present.  He reports significant improvements in his bilateral retro-orbital headache with resolved photophobia and pain with horizontal EOM.  ? ? ?Objective: ? ?Vital signs in last 24 hours: ?Vitals:  ? 03/14/21 1520 03/14/21 1834 03/14/21 2114 03/15/21 0523  ?BP: (!) 125/57 (!) 105/43 (!) 116/49 116/68  ?Pulse: 70 65 64 (!) 57  ?Resp: 16 18 15 19   ?Temp: 98.6 ?F (37 ?C) 99 ?F (37.2 ?C) 99.3 ?F (37.4 ?C) 98.4 ?F (36.9 ?C)  ?TempSrc: Oral Oral Oral Oral  ?SpO2: 100% 99% 99% 99%  ?Weight:      ?Height:      ? ?Physical Exam: ?General: chronically ill-appearing male, lying in bed, in NAD. ?HEENT: Normocephalic, atraumatic.  Eyes without pain noted with horizontal EOM.   Bilateral conjunctiva without injection nor purulence; clear sclerae. No tearing, no signs of periorbital cellulitis (no swelling, erythema). ?Pulm: normal work of breathing on RA. ?Skin: warm and dry. L jawline with no erythema, swelling, no lymphadenopathy.  Patient describes tenderness to palpation.  Erythema generally present on back from position of lying, but no erythema nor excoriations present in the area in which patient describes pain.  Some TTP elicited ?Neuro: AAOx3. ?Psych: low mood and affect. ? ?Assessment/Plan: ? ?Principal Problem: ?  Bacteremia ?Active Problems: ?  Nonhealing surgical wound ?  ESRD (end stage renal disease) on dialysis [facility]) ?  Fever ?  Bilateral flank pain ?  Bilateral kidney stones ?  Sepsis ([place]) ?  Hypotension ? ?Patient Summary: ?Nil Xiong is a 56 y.o. male with PMHx ESRD on HD T/TH/S, hypothyroidism, and chronic pain, initially presenting from HD for nausea, bilateral flank pain, and hypotension who was then admitted to the ICU for management of an arterial line and hypotension 2/2  severe sepsis from E coli bacteremia, now out of the ICU with management of pain, constipation, and BP. ?  ?Hypotension secondary to E.Coli Bacteremia, likely source of infection is urinary tract versus femoral HD Catheter ?Patient reports prior history of bacteremia with suspected source of femoral HD catheter; when cultures drawn from catheter, negative. Midodrine d/c per patient request d/t HA, and BP has remained stable when not in HD. ?- Per sensitivities, adjusted abx to Cefazolin with HD. Day 6 of 14.  EOT 3/31. ?  ?ESRD on T/TH/S HD ?Hyperkalemia, resolved ?Managed per nephro, appreciate assistance.  ?  ?Acute Back Pain and HA on Chronic Pain Syndrome ?Chronic L knee pain ?Patient on chronic opioids for spina bifida and renal stones from chart review. PDMP shows long term prescription of oxycodone-acetaminophen 10-325 QID. 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 Patient with new retro-orbital HA and severely itching right back and left jaw pain with great improvements from previous day; prior pain reported well managed on current regimen. ?- Continue heating pad and muscle rub cream for back pain, cold pack and Tylenol for eye pain, and advise to keep lights low for photophobia. ?-Tylenol scheduled for mild pain ?-Dilaudid 0.5 mg every 4 hours PRN moderate to severe pain ?- Continue home Percocet 10-325 QID PRN moderate pain ?-- Continue Senna 2 tablets BID and Miralax daily for constipation, which may be 2/2 pain medication regimen. ?  ?Severe aortic Stenosis ?Continue patient's Plavix, ASA, Crestor.  Patient has outpatient follow-up scheduled  3/15 with cardiologist Dr. Angelena Form ?  ?Thrombocytopenia ?Leukopenia, resolved ?WBC and platelet count stable and gradually improving.  This was expected in setting of sepsis; expect cell counts to continue to gradually improve.  ?  ?Anemia of Chronic Disease ?Hemoglobin stable, 7-10 on review for past 2 months.  Patient being given ESA by nephrology ? ? ?Prior to Admission Living Arrangement: Home ?Anticipated Discharge Location: Home with [facility] PT/OT ?Barriers to Discharge: Optimization of pain management ?Dispo: Anticipated discharge in approximately 1-2 day(s).  ? Rosezetta Schlatter, MD ?03/15/2021, 6:37 AM ?Pager: [phone] ?After 5pm on weekdays and 1pm on weekends: On Call pager [phone]  ?

## 2021-03-15 NOTE — Progress Notes (Signed)
Occupational Therapy Treatment ?Patient Details ?Name: [name] ?MRN: [id number] ?DOB: 30-Apr-1965 ?Today's Date: 03/15/2021 ? ? ?History of present illness 56 y/o male presented to ED on 03/07/21 from dialysis for nausea, R flank pain, and hypotension. Admitted for sepsis workup. Found to have +E Coli. CT abdomen negative. CXR negative. PMH: ESRD, seizure disorder, HTN, aortic stenosis, spina bifida, anemia, obesity, anxiety ?  ?OT comments ? Pt progressing towards goals this session, requiring min A for ADLs, supervision for bed mobility when given increased time, pt min guard for transfers with RW however unsteady with gait and fatigues quickly after ambulating short distances. Requires increased cues for safety, however is not receptive. Pt able to complete UB/LB exercises up in chair with demonstration. Pt presenting with impairments listed below, will follow acutely. Continue to recommend HHOT at d/c.  ? ?Recommendations for follow up therapy are one component of a multi-disciplinary discharge planning process, led by the attending physician.  Recommendations may be updated based on patient status, additional functional criteria and insurance authorization. ?   ?Follow Up Recommendations ? Home health OT  ?  ?Assistance Recommended at Discharge Intermittent Supervision/Assistance  ?Patient can return home with the following ? A little help with walking and/or transfers;A lot of help with bathing/dressing/bathroom;Assistance with cooking/housework;Assist for transportation;Help with stairs or ramp for entrance ?  ?Equipment Recommendations ? BSC/3in1  ?  ?Recommendations for Other Services   ? ?  ?Precautions / Restrictions Precautions ?Precautions: Fall ?Precaution Comments: perm R femoral HD catheter ?Restrictions ?Weight Bearing Restrictions: No  ? ? ?  ? ?Mobility Bed Mobility ?Overal bed mobility: Needs Assistance ?  ?Rolling: Supervision ?Sidelying to sit: Supervision ?  ?  ?Sit to sidelying:  Supervision ?General bed mobility comments: increased time ?  ? ?Transfers ?Overall transfer level: Needs assistance ?Equipment used: Rolling walker (2 wheels) ?Transfers: Bed to chair/wheelchair/BSC, Sit to/from Stand ?Sit to Stand: Min guard ?  ?  ?  ?  ?  ?General transfer comment: min guard from regular bed height ?  ?  ?Balance Overall balance assessment: Needs assistance ?Sitting-balance support: No upper extremity supported, Feet supported ?Sitting balance-Leahy Scale: Fair ?  ?  ?Standing balance support: Bilateral upper extremity supported, Reliant on assistive device for balance ?Standing balance-Leahy Scale: Poor ?Standing balance comment: reliant on UE support ?  ?  ?  ?  ?  ?  ?  ?  ?  ?  ?  ?   ? ?ADL either performed or assessed with clinical judgement  ? ?ADL   ?  ?  ?  ?  ?  ?  ?  ?  ?  ?  ?Lower Body Dressing: Supervision/safety;Sitting/lateral leans ?Lower Body Dressing Details (indicate cue type and reason): dons socks with figure 4 ?Toilet Transfer: Minimal assistance;Rolling walker (2 wheels);Ambulation ?Toilet Transfer Details (indicate cue type and reason): simulated in room ?  ?  ?  ?  ?Functional mobility during ADLs: Minimal assistance ?  ?  ? ?Extremity/Trunk Assessment Upper Extremity Assessment ?Upper Extremity Assessment: RUE deficits/detail ?RUE Deficits / Details: reports numbness/tingling in digits 1-3 ?LUE Deficits / Details: reports numbness/tingling in digits 1-3 ?  ?Lower Extremity Assessment ?Lower Extremity Assessment: Generalized weakness ?  ?  ?  ? ?Vision   ?Vision Assessment?: No apparent visual deficits ?  ?Perception Perception ?Perception: Not tested ?  ?Praxis Praxis ?Praxis: Not tested ?  ? ?Cognition Arousal/Alertness: Awake/alert ?Behavior During Therapy: Restless, Agitated, Impulsive ?Overall Cognitive Status: Within Functional Limits for tasks assessed ?  ?  ?  ?  ?  ?  ?  ?  ?  ?  ?  ?  ?  ?  ?  ?  ?  ?  ?  ?   ?  Exercises Exercises: General Upper Extremity,  General Lower Extremity ?General Exercises - Upper Extremity ?Shoulder Horizontal ABduction: AROM, 15 reps, Both, Seated ?Shoulder Horizontal ADduction: AROM, Both, 15 reps, Seated ?General Exercises - Lower Extremity ?Ankle Circles/Pumps: AROM, Both, 15 reps, Seated ?Long Arc Quad: AROM, Both, 15 reps, Seated ? ?  ?Shoulder Instructions   ? ? ?  ?General Comments pt not receptive to safety cues during transfer, particular in how he does stand pivot transfers to chair  ? ? ?Pertinent Vitals/ Pain       Pain Assessment ?Pain Assessment: Faces ?Pain Score: 8  ?Faces Pain Scale: Hurts whole lot ?Pain Location: abdomen and back ?Pain Descriptors / Indicators: Aching, Discomfort, Grimacing, Guarding, Sore ?Pain Intervention(s): Limited activity within patient's tolerance, Monitored during session, Repositioned ? ?Home Living   ?  ?  ?  ?  ?  ?  ?  ?  ?  ?  ?  ?  ?  ?  ?  ?  ?  ?  ? ?  ?Prior Functioning/Environment    ?  ?  ?  ?   ? ?Frequency ? Min 2X/week  ? ? ? ? ?  ?Progress Toward Goals ? ?OT Goals(current goals can now be found in the care plan section) ? Progress towards OT goals: Progressing toward goals ? ?Acute Rehab OT Goals ?Patient Stated Goal: none stated ?OT Goal Formulation: With patient ?Time For Goal Achievement: 03/27/21 ?Potential to Achieve Goals: Good ?ADL Goals ?Pt Will Perform Upper Body Dressing: with supervision;sitting ?Pt Will Perform Lower Body Dressing: with min assist;sitting/lateral leans;sit to/from stand ?Pt Will Transfer to Toilet: ambulating;regular height toilet;with min guard assist ?Pt Will Perform Tub/Shower Transfer: rolling walker;tub bench;Tub transfer;Shower transfer;with min guard assist  ?Plan Discharge plan remains appropriate;Frequency remains appropriate   ? ?Co-evaluation ? ? ?   ?  ?  ?  ?  ? ?  ?AM-PAC OT "6 Clicks" Daily Activity     ?Outcome Measure ? ? Help from another person eating meals?: None ?Help from another person taking care of personal grooming?: A  Little ?Help from another person toileting, which includes using toliet, bedpan, or urinal?: A Little ?Help from another person bathing (including washing, rinsing, drying)?: A Lot ?Help from another person to put on and taking off regular upper body clothing?: A Little ?Help from another person to put on and taking off regular lower body clothing?: A Lot ?6 Click Score: 17 ? ?  ?End of Session Equipment Utilized During Treatment: Gait belt;Rolling walker (2 wheels) ? ?OT Visit Diagnosis: Unsteadiness on feet (R26.81);Other abnormalities of gait and mobility (R26.89);Muscle weakness (generalized) (M62.81) ?  ?Activity Tolerance Patient limited by fatigue ?  ?Patient Left in chair;with call bell/phone within reach;with chair alarm set ?  ?Nurse Communication Mobility status ?  ? ?   ? ?Time: 1443-1540 ?OT Time Calculation (min): 34 min ? ?Charges: OT General Charges ?$OT Visit: 1 Visit ?OT Treatments ?$Self Care/Home Management : 23-37 mins ? ?Lynnda Child, OTD, OTR/L ?Acute Rehab ?[phone] ? ? ?Kaylyn Lim ?03/15/2021, 11:37 AM ?

## 2021-03-16 ENCOUNTER — Ambulatory Visit: Payer: Medicare Other | Admitting: Cardiovascular Disease

## 2021-03-16 DIAGNOSIS — M549 Dorsalgia, unspecified: Secondary | ICD-10-CM

## 2021-03-16 DIAGNOSIS — D631 Anemia in chronic kidney disease: Secondary | ICD-10-CM

## 2021-03-16 DIAGNOSIS — G8929 Other chronic pain: Secondary | ICD-10-CM

## 2021-03-16 DIAGNOSIS — I9589 Other hypotension: Secondary | ICD-10-CM

## 2021-03-16 LAB — CBC
HCT: 33.4 % — ABNORMAL LOW (ref 39.0–52.0)
Hemoglobin: 10.2 g/dL — ABNORMAL LOW (ref 13.0–17.0)
MCH: 30.4 pg (ref 26.0–34.0)
MCHC: 30.5 g/dL (ref 30.0–36.0)
MCV: 99.4 fL (ref 80.0–100.0)
Platelets: 163 10*3/uL (ref 150–400)
RBC: 3.36 MIL/uL — ABNORMAL LOW (ref 4.22–5.81)
RDW: 18.2 % — ABNORMAL HIGH (ref 11.5–15.5)
WBC: 5.5 10*3/uL (ref 4.0–10.5)
nRBC: 0.4 % — ABNORMAL HIGH (ref 0.0–0.2)

## 2021-03-16 LAB — RENAL FUNCTION PANEL
Albumin: 3.2 g/dL — ABNORMAL LOW (ref 3.5–5.0)
Anion gap: 16 — ABNORMAL HIGH (ref 5–15)
BUN: 66 mg/dL — ABNORMAL HIGH (ref 6–20)
CO2: 24 mmol/L (ref 22–32)
Calcium: 8.7 mg/dL — ABNORMAL LOW (ref 8.9–10.3)
Chloride: 96 mmol/L — ABNORMAL LOW (ref 98–111)
Creatinine, Ser: 10.6 mg/dL — ABNORMAL HIGH (ref 0.61–1.24)
GFR, Estimated: 5 mL/min — ABNORMAL LOW (ref >60)
Glucose, Bld: 91 mg/dL (ref 70–99)
Phosphorus: 6.9 mg/dL — ABNORMAL HIGH (ref 2.5–4.6)
Potassium: 5.1 mmol/L (ref 3.5–5.1)
Sodium: 136 mmol/L (ref 135–145)

## 2021-03-16 MED ORDER — HYDROMORPHONE HCL 1 MG/ML IJ SOLN
0.5000 mg | Freq: Once | INTRAMUSCULAR | Status: AC
  Administered 2021-03-16: 0.5 mg via INTRAVENOUS
  Filled 2021-03-16: qty 1

## 2021-03-16 NOTE — Progress Notes (Signed)
? ?Subjective: No acute overnight events. ? ?Back still causing some discomfort. Neck feels better with benadryl cream.  ? ?Still does not feel ready to go home. Discussed that he is otherwise medically stable. States he does not yet feel comfortable. Discussed if he would rather pursue SNF, but he refuses. ? ?Objective: ? ?Vital signs in last 24 hours: ?Vitals:  ? 03/16/21 1000 03/16/21 1030 03/16/21 1100 03/16/21 1155  ?BP: (!) 121/58 135/64 (!) 111/53 (!) 116/56  ?Pulse: 64 66 70 67  ?Resp: 16 13 18 20   ?Temp:    99.8 ?F (37.7 ?C)  ?TempSrc:    Oral  ?SpO2:    97%  ?Weight:      ?Height:      ? ?Physical Exam: ?General: chronically ill-appearing male, lying in bed, in NAD. ?HEENT: Normocephalic, atraumatic.  Bilateral conjunctiva without injection nor purulence; clear sclerae. No tearing, no signs of periorbital cellulitis (no swelling, erythema). ?Pulm: normal work of breathing on RA. ?Skin: warm and dry. L jawline with no erythema, swelling, no lymphadenopathy.  Patient describes tenderness to palpation.  Erythema generally present on back from position of lying, but no erythema, swelling, fluctuance, lesions nor excoriations present in the area in which patient describes pain.  ?Neuro: AAOx3. ?Psych: low mood and affect. ? ?Assessment/Plan: ? ?Principal Problem: ?  Bacteremia ?Active Problems: ?  Nonhealing surgical wound ?  ESRD (end stage renal disease) on dialysis [facility]) ?  Fever ?  Bilateral flank pain ?  Bilateral kidney stones ?  Sepsis ([place]) ?  Hypotension ? ?Patient Summary: ?[name] is a 56 y.o. male with PMHx ESRD on HD T/TH/S, hypothyroidism, and chronic pain, initially presenting from HD for nausea, bilateral flank pain, and hypotension who was then admitted to the ICU for management of an arterial line and hypotension 2/2 severe sepsis from E coli bacteremia, now out of the ICU with management of pain, constipation, and BP. ?  ?Hypotension secondary to E.Coli Bacteremia, likely source of  infection is urinary tract versus femoral HD Catheter ?Patient reports prior history of bacteremia with suspected source of femoral HD catheter; when cultures drawn from catheter, negative. Midodrine d/c per patient request d/t HA, and BP has remained stable when not in HD. ?- Per sensitivities, adjusted abx to Cefazolin with HD. Day 7 of 14.  EOT 3/31. ?  ?ESRD on T/TH/S HD ?Hyperkalemia, resolved ?Managed per nephro, appreciate assistance.  ?  ?Acute Back Pain and HA on Chronic Pain Syndrome ?Chronic L knee pain ?Patient on chronic opioids for spina bifida and renal stones from chart review. PDMP shows long term prescription of oxycodone-acetaminophen 10-325 QID. 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 Patient with new retro-orbital HA and severely itching right back and left jaw pain with great improvements from previous day; prior pain reported well managed on current regimen. ?- Continue heating pad and topical Benadryl for back and itching pain, cold pack and Tylenol for eye pain. ?-Tylenol scheduled for mild pain ?-Dilaudid 0.5 mg every 4 hours PRN discontinued to restore home pain regimen. ?- Continue home Percocet 10-325 QID PRN moderate pain ?-- Continue Senna 2 tablets BID and Miralax daily for constipation, which may be 2/2 pain medication regimen. ?  ?Severe aortic Stenosis ?Continue patient's Plavix, ASA, Crestor.  Patient has outpatient follow-up scheduled 3/15 with cardiologist Dr. Angelena Form ?  ?Thrombocytopenia, resolved ?Leukopenia, resolved ?WBC 5.5 and platelet count 163.  This was expected in setting of sepsis; improved  as sepsis is resolved. ?  ?Anemia of Chronic Disease ?Hemoglobin stable, 7-10 on review for past 2 months. Patient being given ESA by nephrology ? ? ?Prior to Admission Living Arrangement: Home ?Anticipated Discharge Location: Home with [facility]/OT ?Barriers to Discharge: Optimization of pain management ?Dispo: Anticipated  discharge in approximately 1-2 day(s).  ? ?Rosezetta Schlatter, MD ?03/16/2021, 2:20 PM ?Pager: [phone] ?After 5pm on weekdays and 1pm on weekends: On Call pager [phone]  ?

## 2021-03-16 NOTE — TOC Progression Note (Signed)
Transition of Care (TOC) - Progression Note  ? ? ?Patient Details  ?Name: [name] ?MRN: [id number] ?Date of Birth: 01-06-1965 ? ?Transition of Care (TOC) CM/SW Contact  ?Tom-Johnson, Renea Ee, RN ?Phone Number: ?03/16/2021, 12:12 PM ? ?Clinical Narrative:    ? ?Medical workup continues for pain management. Patient c/o having some neuropathic pain vs MSK pain in his back per MD. Had inpatient dialysis today. CM will continue to follow for needs. ? ?Expected Discharge Plan: [place] ?Barriers to Discharge: Continued Medical Work up ? ?Expected Discharge Plan and Services ?Expected Discharge Plan: [place] ?  ?Discharge Planning Services: CM Consult ?Post Acute Care Choice: Home Health ?Living arrangements for the past 2 months: [place] ?                ?DME Arranged: N/A ?DME Agency: NA ?  ?  ?  ?HH Arranged: PT, OT ?[place] Agency: [place] ?Date HH Agency Contacted: 03/11/21 ?Time [place]: [id number] ?Representative spoke with at [place]: Amy ? ? ?Social Determinants of Health (SDOH) Interventions ?  ? ?Readmission Risk Interventions ?Readmission Risk Prevention Plan 03/11/2021  ?Transportation Screening Complete  ?PCP or Specialist Appt within 3-5 Days Complete  ?[place] or Home Care Consult Complete  ?Social Work Consult for [place] Planning/Counseling Complete  ?Palliative Care Screening Not Applicable  ?Medication Review [employer]) Complete  ?Some recent data might be hidden  ? ? ?

## 2021-03-16 NOTE — Progress Notes (Addendum)
removed 3056mls net fluid.  pre bp 138/77 post bp 111/42  pre weight 126.2kg post weight 123kg bed scales.  cath ran well in right groin packed with cathflo activase as ordered.  gave hectorol, zofran as ordered. ?

## 2021-03-16 NOTE — Discharge Summary (Signed)
? ?Name: [name] ?MRN: [id number] ?DOB: Apr 27, 1965 56 y.o. ?PCP: [name], MD ? ?Date of Admission: 03/07/2021 10:56 AM ?Date of Discharge: 03/17/2021 2:00 PM ?Attending Physician: Lottie Mussel, MD ? ?Discharge Diagnosis: ?1. E. Coli bacteremia- resolved ?2. Hypotension 2/2 bacteremia- resolved ?3. ESRD on HD ?4. Chronic pain syndrome ?5. Severe aortic stenosis ?6. Thrombocytopenia- resolved  ?7. Leukopenia- resolved ?8. Anemia of chronic disease  ? ?Discharge Medications: ?Allergies as of 03/17/2021   ? ?   Reactions  ? Hydrocodone Other (See Comments)  ? Caused involuntary movement and twitching. CANNOT TAKE DUE TO MUSCLE SPASMS AND MUSCLE TREMORS ?Tolerates hydromorphone and oxycodone.  ? Furadantin [nitrofurantoin] Other (See Comments)  ? UNSPECIFIED REACTION   ? Mandelamine [methenamine] Other (See Comments)  ? UNSPECIFIED REACTION   ? Noroxin [norfloxacin] Other (See Comments)  ? UNSPECIFIED REACTION   ? Colace [docusate] Diarrhea, Nausea And Vomiting  ? Elemental Sulfur Cough  ? Childhood reaction - pt could not confirm that it was a cough  ? Sulfa Antibiotics Other (See Comments), Cough  ? Childhood reaction - pt could not confirm that it was a cough  ? ?  ? ?  ?Medication List  ?  ? ?TAKE these medications   ? ?acetaminophen 500 MG tablet ?Commonly known as: TYLENOL ?Take 1,000 mg by mouth every 6 (six) hours as needed for mild pain or headache. ?  ?aspirin EC 325 MG tablet ?Take 325 mg by mouth daily. ?  ?calcium acetate 667 MG capsule ?Commonly known as: PHOSLO ?Take 3-4 capsules (2,001-2,668 mg total) by mouth See admin instructions. Take 2708 mg by mouth 3 times daily with meals, take 2001 mg by mouth with snacks ?What changed:  ?how much to take ?when to take this ?additional instructions ?  ?ceFAZolin 2-4 GM/100ML-% IVPB ?Commonly known as: ANCEF ?Inject 100 mLs (2 g total) into the vein every Monday, Wednesday, and Friday at 6 PM for 7 doses. ?Start taking on: March 18, 2021 ?  ?cinacalcet  30 MG tablet ?Commonly known as: SENSIPAR ?Take 1 tablet (30 mg total) by mouth every Monday, Wednesday, and Friday with hemodialysis. ?  ?clopidogrel 75 MG tablet ?Commonly known as: PLAVIX ?Take 75 mg by mouth daily. ?  ?diclofenac Sodium 1 % Gel ?Commonly known as: VOLTAREN ?Apply 2 g topically daily as needed (For leg pain). ?  ?doxercalciferol 4 MCG/2ML injection ?Commonly known as: HECTOROL ?Inject 2 mLs (4 mcg total) into the vein every Monday, Wednesday, and Friday with hemodialysis. ?  ?escitalopram 5 MG tablet ?Commonly known as: LEXAPRO ?Take 5 mg by mouth daily. ?  ?famotidine 40 MG tablet ?Commonly known as: PEPCID ?Take 40 mg by mouth daily. ?  ?fluticasone 50 MCG/ACT nasal spray ?Commonly known as: FLONASE ?Place 2 sprays into both nostrils as needed for allergies or rhinitis (congestion). ?What changed: when to take this ?  ?furosemide 80 MG tablet ?Commonly known as: LASIX ?Take 80-160 mg by mouth See admin instructions. Take 80 mg daily and may take an extra 80 mg if needed for fluid ?  ?levothyroxine 137 MCG tablet ?Commonly known as: SYNTHROID ?Take 137 mcg by mouth daily before breakfast. ?  ?LORazepam 1 MG tablet ?Commonly known as: ATIVAN ?Take 1 mg by mouth 2 (two) times daily as needed for anxiety. ?  ?Melatonin 10 MG Tabs ?Take 10 mg by mouth at bedtime as needed (sleep). ?  ?Nepro Liqd ?Take 237 mLs by mouth daily. Mixed Berry ?  ?oxyCODONE-acetaminophen 10-325 MG tablet ?Commonly known  as: PERCOCET ?Take 1 tablet by mouth every 6 (six) hours as needed for pain. ?  ?polyethylene glycol 17 g packet ?Commonly known as: MIRALAX / GLYCOLAX ?Take 17 g by mouth daily. ?What changed:  ?when to take this ?reasons to take this ?  ?promethazine 25 MG tablet ?Commonly known as: PHENERGAN ?Take 1 tablet (25 mg total) by mouth 2 (two) times daily as needed for nausea or vomiting. ?  ?rosuvastatin 10 MG tablet ?Commonly known as: CRESTOR ?Take 1 tablet (10 mg total) by mouth daily. ?  ?senna 8.6 MG Tabs  tablet ?Commonly known as: SENOKOT ?Take 2 tablets (17.2 mg total) by mouth daily as needed for mild constipation. ?  ?sodium polystyrene powder ?Commonly known as: KAYEXALATE ?Take 15 g by mouth See admin instructions. Take 15 g (mixed in water) by mouth on Saturdays and Sundays ?What changed: additional instructions ?  ?traZODone 100 MG tablet ?Commonly known as: DESYREL ?Take 1 tablet (100 mg total) by mouth at bedtime. ?  ?zolpidem 10 MG tablet ?Commonly known as: AMBIEN ?Take 1 tablet (10 mg total) by mouth at bedtime. ?  ? ?  ? ?  ?  ? ? ?  ?Durable Medical Equipment  ?(From admission, onward)  ?  ? ? ?  ? ?  Start     Ordered  ? 03/17/21 1235  For home use only DME 3 n 1  Once       ? 03/17/21 1235  ? ?  ?  ? ?  ? ? ?Disposition and follow-up:   ?Mr.Cheston Coury was discharged from [facility] in Stable condition.  At the hospital follow up visit please address: ? ?1. Consider urology referral for bilateral nephrostomy tube wounds as source of recurrent infection (patient reports unable to get care at [facility], so advise branching out with transportation support). Consider outpatient therapy and palliative care referrals; do not believe that patient is ready for goals of care conversation but may be beneficial for additional resources and support at home.  ? ?2. Discussed with nephrology the need to continue Cefazolin until 3/31. Advised that they will arrange for him with outpatient HD. ? ?3.  Labs / imaging needed at time of follow-up: N/A ? ?4.  Pending labs/ test needing follow-up: N/A ? ?Follow-up Appointments: ? Follow-up Information   ? ? [place] Follow up.   ?Specialty: Rehabilitation ?Why: Someone will call you to schedule first home visit. ?Contact information: ?[place]. ?[place] ?[phone] ? ?  ?  ? ?  ?  ? ?  ? ? ?Hospital Course by problem list: ?Hypotension secondary to E.Coli Bacteremia, likely  source of infection is urinary tract versus femoral HD Catheter- resolved ?Patient initially presented from HD 2/2 hypotension. Transferred to ICU 3/7 for management of arterial line, found to have E.Coli bacteremia and initiated on treatment with Ceftriaxone, adjusted to Cefazolin with sensitivities, to be given with HD (EOT 3/31). While in the ICU, patient met MAP goal >55 and remained afebrile. Midodrine was initiated and decreased by step down from ICU. Stepped down from ICU 3/9, and remained hemodynamically stable since. Patient reports prior history of bacteremia with suspected source of femoral HD catheter; when cultures drawn from catheter, negative. Most likely source presumed to be in the area of nonhealing wounds where nephrostomy tubes were removed; patient with purulent and serosanguineous drainage on bandages.  Midodrine d/c per patient request d/t HA, and BP remained stable when  not in HD for remainder of admission.  ?  ?ESRD on T/TH/S HD ?Patient able to resume regularly scheduled HD sessions without issue.  ?  ?Chronic Pain Syndrome ?Patient on chronic opioids for spina bifida and renal stones from chart review. PDMP shows long term prescription of oxycodone-acetaminophen 10-325 QID. 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 During admission, patient reported new retro-orbital HA and severely itching right back and left jaw pain that largely resolved by day of discharge with conservative measures; exception is that patient reported only minimal relief of back itching from these measures. Patient reported BM with admission bowel regimen. ?  ?Severe aortic Stenosis ?Plavix, ASA, Crestor continued.  Patient had outpatient follow-up scheduled 3/15 cardiologist Dr. Angelena Form, but rescheduled due to continued admission. ?  ?Thrombocytopenia, resolved ?Leukopenia, resolved ?WBC and platelet count WNL by time of discharge. Derangements of both were  expected in setting of sepsis.  ?  ?Anemia of Chronic Disease ?Hemoglobin stable, 7-10 on review for past 2 months. Patient being given ESA by nephrology. ? ?Discharge Subjective: ?States that he feels o

## 2021-03-16 NOTE — Progress Notes (Signed)
OP dialysis Orders:  ?MWF - [place] ([place]) ?4hrs46min, BFR 350, DFR 800,  EDW 121 kg, 2K/ 2Ca ?Heparin 3700 units IV ?No ESA-last Hgb 12.2 in OP HD center ?Hectorol 73mcg IV qHD-last 03/07/21 ?Sensipar 90mg  with HD-last 03/07/21 ?Phoslo 667mg  4 capsules with meals and 3 with snacks ?  ?  ?Problem/Plan: ?E Coli Bacteremia-on IV Ceftriaxone.  Work-up per primary.  Repeat blood cultures 3/8 no growth to date likely source urinary tract versus femoral HD catheter(has not had chills and fever using PermCath on dialysis) ?Chronic R flank discomfort 2/2 to chronic nephrolithiasis. Pain management per primary.  ?ESRD - on HD MWF. HD on schedule schedule. (History of recurrent hyperkalemia on p.o. Kayexalate at home) ? ?Seen on HD 114/53 asymp ?2K bath 3L net UF ? ?Next HD Fri. ? ?Issues with TDC: Chronic issue but last access available for this patient. TDC MUST be packed with cath flo q treatment.  No issues with PermCath yesterday on dialysis ?Hypertension/volume  -BP stable this a.m. 114/53, trace peripheral edema. BP stable on midodrine. UF as tolerated. EDW raised 03/07/2020.   ?Anemia of CKD -Hgb 9.8 was 12 in outpatient/will started on  ESA 3/13 HD 40 mcg, follow-up Hgb trend, hold Fe for now given current infection ?Secondary Hyperparathyroidism - Corrected Ca+ 9.1 PO4 5.0 previously on high dose calcium acetate.  No binders ordered on admission. follow trends. Patient is aware of this information as it speaks to his OP compliance with meds/diet.  Will need to resume binders on discharge. Continue Hectorol and sensipar.  ?Nutrition - Renal diet with fluid restriction. Will start protein supplements ?Severe AS-followed by Dr. Angelena Form. ? ?Subjective: C/o burning, itching, pain in the back o/w denies f/cn/v/ dyspnea. ? ? ?Objective ?Vital signs in last 24 hours: ?Vitals:  ? 03/16/21 0800 03/16/21 0830 03/16/21 0900 03/16/21 0930  ?BP: (!) 102/44 (!) 101/41 (!) 114/55 (!) 114/53  ?Pulse: 61 61 62 63   ?Resp: 17 14    ?Temp:      ?TempSrc:      ?SpO2:      ?Weight:      ?Height:      ? ?Weight change:  ? ?Physical Exam: ?General: Alert, chronically ill-appearing male NAD ?Heart: RRR, 2/6 SEM no gallop or rub ?Lungs: CTA nonlabored breathing 2 L [place] ?Abdomen: Obese, NABS, ileostomy in place, tenderness decreasing yesterday no distention ?Extremities: Trace bipedal edema  ?dialysis Access: Right thigh TDC dressing dry intact ?  ?  ? ?Labs: ?Basic Metabolic Panel: ?Recent Labs  ?Lab 03/13/21 ?0101 03/14/21 ?1119 03/16/21 ?0724  ?NA 138 137 136  ?K 5.1 5.3* 5.1  ?CL 98 97* 96*  ?CO2 28 26 24   ?GLUCOSE 121* 97 91  ?BUN 60* 78* 66*  ?CREATININE 10.09* 12.58* 10.60*  ?CALCIUM 9.0 9.0 8.7*  ?PHOS 3.8 5.0* 6.9*  ? ?Liver Function Tests: ?Recent Labs  ?Lab 03/13/21 ?0101 03/14/21 ?1119 03/16/21 ?0724  ?ALBUMIN 3.1* 3.1* 3.2*  ? ?No results for input(s): LIPASE, AMYLASE in the last 168 hours. ?No results for input(s): AMMONIA in the last 168 hours. ?CBC: ?Recent Labs  ?Lab 03/11/21 ?9983 03/12/21 ?0211 03/13/21 ?0101 03/14/21 ?3825 03/14/21 ?1120 03/16/21 ?0724  ?WBC 3.5* 3.8* 5.2 4.7 5.1 5.5  ?NEUTROABS 2.4  --  3.4 2.8  --   --   ?HGB 9.6* 9.3* 10.1* 9.4* 9.8* 10.2*  ?HCT 31.9* 30.8* 33.2* 30.6* 32.0* 33.4*  ?MCV 98.2 99.4 98.5 96.5 99.1 99.4  ?PLT 72* 82* 95* 106*  120* 163  ? ?Cardiac Enzymes: ?No results for input(s): CKTOTAL, CKMB, CKMBINDEX, TROPONINI in the last 168 hours. ?CBG: ?No results for input(s): GLUCAP in the last 168 hours. ? ?Studies/Results: ?No results found. ?Medications: ?  ceFAZolin (ANCEF) IV 2 g (03/14/21 1734)  ? ? (feeding supplement) PROSource Plus  30 mL Oral TID BM  ? acetaminophen  1,000 mg Oral TID  ? aspirin EC  81 mg Oral Daily  ? Chlorhexidine Gluconate Cloth  6 each Topical Q0600  ? clopidogrel  75 mg Oral Daily  ? darbepoetin (ARANESP) injection - DIALYSIS  40 mcg Intravenous Q Mon-HD  ? doxercalciferol  3 mcg Intravenous Q M,W,F-HD  ? escitalopram  10 mg Oral Daily  ? famotidine  40 mg  Oral Daily  ? furosemide  80 mg Oral Daily  ? heparin  5,000 Units Subcutaneous Q12H  ? levothyroxine  137 mcg Oral QAC breakfast  ? polyethylene glycol  17 g Oral Daily  ? rosuvastatin  10 mg Oral Daily  ? senna  2 tablet Oral BID  ? traZODone  100 mg Oral QHS  ? ? ? ? ?

## 2021-03-17 MED ORDER — CEFAZOLIN SODIUM-DEXTROSE 2-4 GM/100ML-% IV SOLN: 2.0000 g | INTRAVENOUS | 0 refills | Status: AC

## 2021-03-17 MED ORDER — CALCIUM ACETATE (PHOS BINDER) 667 MG PO CAPS
2001.0000 mg | ORAL_CAPSULE | Freq: Three times a day (TID) | ORAL | Status: DC
  Administered 2021-03-17: 2001 mg via ORAL
  Filled 2021-03-17: qty 3

## 2021-03-17 NOTE — Progress Notes (Signed)
Advised by [facility] staff that pt for d/c today. Contacted [facility] and spoke to [place] . Clinic aware pt will resume care tomorrow.  ? ?Melven Sartorius ?Renal Navigator ?[phone] ?

## 2021-03-17 NOTE — Progress Notes (Signed)
OT Cancellation Note ? ?Patient Details ?Name: [name] ?MRN: [id number] ?DOB: Sep 18, 1965 ? ? ?Cancelled Treatment:    Reason Eval/Treat Not Completed: Other (comment) Pt dressed, ready for DC and going over paperwork with nursing for discharge at [place] to engage in OT session at this time.  ? ?[name] ?03/17/2021, 1:29 PM ?

## 2021-03-17 NOTE — Progress Notes (Signed)
[name] to be discharged Home per MD order. Discussed prescriptions and follow up appointments with the patient. Medication list explained in detail. Patient verbalized understanding. ? ?Skin clean, dry and intact without evidence of skin break down, no evidence of skin tears noted. IV catheter discontinued intact. Site without signs and symptoms of complications. Dressing and pressure applied. Pt denies pain at the site currently. No complaints noted. ? ?Patient free of lines, drains, and wounds.  ? ?An After Visit Summary (AVS) was printed and given to the patient. ?Patient escorted via wheelchair, and discharged home via [facility]. ? ?Amaryllis Dyke, RN  ?

## 2021-03-17 NOTE — Progress Notes (Signed)
Subjective: Complains of some abdominal discomfort was able to eat some breakfast, tolerated 3 L UF yesterday on dialysis ? ?Objective ?Vital signs in last 24 hours: ?Vitals:  ? 03/16/21 1751 03/16/21 2038 03/17/21 0602 03/17/21 0845  ?BP: (!) 106/47 (!) 122/59 (!) 112/58 (!) 95/41  ?Pulse: 63 65 (!) 59 (!) 59  ?Resp:  18 18 18   ?Temp:  99.3 ?F (37.4 ?C) 98.5 ?F (36.9 ?C) 98.2 ?F (36.8 ?C)  ?TempSrc:  Oral Oral Oral  ?SpO2:  97% 100% 98%  ?Weight:      ?Height:      ? ?Weight change:  ? ?Physical Exam: ?General: Alert, chronically ill-appearing male NAD ?Heart: RRR, 2/6 SEM no gallop or rub ?Lungs: CTA nonlabored breathing 2 L [place] ?Abdomen: Obese, NABS, ileostomy in place, tenderness mild, no distention ?Extremities: Trace bipedal edema  ?dialysis Access: Right thigh TDC dressing dry intact ? ?OP dialysis Orders:  ?MWF - [place] ([place]) ?4hrs45min, BFR 350, DFR 800,  EDW 121 kg, 2K/ 2Ca ?Heparin 3700 units IV ?No ESA-last Hgb 12.2 in OP HD center ?Hectorol 51mcg IV qHD-last 03/07/21 ?Sensipar 90mg  with HD-last 03/07/21 ?Phoslo 667mg  4 capsules with meals and 3 with snacks ? ?Problem/Plan: ?E Coli Bacteremia-on IV Ceftriaxone.  Work-up per primary.  Repeat blood cultures 3/8 no growth to date likely source urinary tract / femoral HD catheter(has not had chills and fever using PermCath on dialysis) ?Chronic R flank discomfort 2/2 to chronic nephrolithiasis. Pain management per primary.  ?ESRD - on HD MWF. HD on schedule schedule. (History of recurrent hyperkalemia on p.o. Kayexalate at home ?Next HD Fri. ?Issues with TDC: Chronic issue but last access available for this patient. TDC MUST be packed with cath flo q treatment.  No issues with PermCath yesterday on dialysis ?Hypertension/volume  -BP stable this a.m. 112/58 trace peripheral edema. BP stable on midodrine. UF as tolerated. EDW raised 03/07/2020.   ?Anemia of CKD -Hgb 10.2 on 3/15 was 12 in outpatient/will started on  ESA 3/13 HD 40 mcg,  follow-up Hgb trend, hold Fe 2/2  infection ?Secondary Hyperparathyroidism = lab 3/15- Corrected Ca+ 9.4 PO4 6.9                                                                                                                            No binders ordered on admission.  Low phos ,restart binders = PhosLo today . Continue Hectorol and sensipar.  ?Nutrition -ALB 3.2 renal diet with fluid restriction.  protein supplements as needed ?Severe AS-followed by Dr. Angelena Form.e - ? ?Ernest Haber, PA-C ?[facility] ?Beeper [phone] ?03/17/2021,10:44 AM ? LOS: 10 days  ? ?Labs: ?Basic Metabolic Panel: ?Recent Labs  ?Lab 03/13/21 ?0101 03/14/21 ?1119 03/16/21 ?0724  ?NA 138 137 136  ?K 5.1 5.3* 5.1  ?CL 98 97* 96*  ?CO2 28 26 24   ?GLUCOSE 121* 97 91  ?BUN 60* 78* 66*  ?CREATININE 10.09* 12.58* 10.60*  ?CALCIUM 9.0 9.0 8.7*  ?  PHOS 3.8 5.0* 6.9*  ? ?Liver Function Tests: ?Recent Labs  ?Lab 03/13/21 ?0101 03/14/21 ?1119 03/16/21 ?0724  ?ALBUMIN 3.1* 3.1* 3.2*  ? ?No results for input(s): LIPASE, AMYLASE in the last 168 hours. ?No results for input(s): AMMONIA in the last 168 hours. ?CBC: ?Recent Labs  ?Lab 03/11/21 ?5361 03/12/21 ?0211 03/13/21 ?0101 03/14/21 ?4431 03/14/21 ?1120 03/16/21 ?0724  ?WBC 3.5* 3.8* 5.2 4.7 5.1 5.5  ?NEUTROABS 2.4  --  3.4 2.8  --   --   ?HGB 9.6* 9.3* 10.1* 9.4* 9.8* 10.2*  ?HCT 31.9* 30.8* 33.2* 30.6* 32.0* 33.4*  ?MCV 98.2 99.4 98.5 96.5 99.1 99.4  ?PLT 72* 82* 95* 106* 120* 163  ? ?Cardiac Enzymes: ?No results for input(s): CKTOTAL, CKMB, CKMBINDEX, TROPONINI in the last 168 hours. ?CBG: ?No results for input(s): GLUCAP in the last 168 hours. ? ?Studies/Results: ?No results found. ?Medications: ?  ceFAZolin (ANCEF) IV Stopped (03/16/21 1829)  ? ? (feeding supplement) PROSource Plus  30 mL Oral TID BM  ? acetaminophen  1,000 mg Oral TID  ? aspirin EC  81 mg Oral Daily  ? calcium acetate  2,001 mg Oral TID WC  ? Chlorhexidine Gluconate Cloth  6 each Topical Q0600  ? clopidogrel  75 mg Oral  Daily  ? darbepoetin (ARANESP) injection - DIALYSIS  40 mcg Intravenous Q Mon-HD  ? doxercalciferol  3 mcg Intravenous Q M,W,F-HD  ? escitalopram  10 mg Oral Daily  ? famotidine  40 mg Oral Daily  ? furosemide  80 mg Oral Daily  ? heparin  5,000 Units Subcutaneous Q12H  ? levothyroxine  137 mcg Oral QAC breakfast  ? polyethylene glycol  17 g Oral Daily  ? rosuvastatin  10 mg Oral Daily  ? senna  2 tablet Oral BID  ? traZODone  100 mg Oral QHS  ? ? ? ? ?

## 2021-03-17 NOTE — TOC Transition Note (Signed)
Transition of Care (TOC) - CM/SW Discharge Note ? ? ?Patient Details  ?Name: [name] ?MRN: [id number] ?Date of Birth: Oct 11, 1965 ? ?Transition of Care (TOC) CM/SW Contact:  ?Tom-Johnson, Renea Ee, RN ?Phone Number: ?03/17/2021, 3:11 PM ? ? ?Clinical Narrative:    ? ?Patient is scheduled for discharge today. Home health information on AVS. [facility] recommended, unable to order as patient had one in 2021. [facility] to transport at discharge. No further TOC needs noted.  ? ?Final next level of care: [place] ?Barriers to Discharge: Barriers Resolved ? ? ?Patient Goals and CMS Choice ?Patient states their goals for this hospitalization and ongoing recovery are:: To return home ?CMS Medicare.gov Compare Post Acute Care list provided to:: Patient ?Choice offered to / list presented to : Patient ? ?Discharge Placement ?  ?           ?  ?Patient to be transferred to facility by: [facility] ?  ?  ? ?Discharge Plan and Services ?  ?Discharge Planning Services: CM Consult ?Post Acute Care Choice: Home Health          ?DME Arranged:  ([facility] recommended, had one in 2021) ?DME Agency: NA ?  ?  ?  ?HH Arranged: PT, OT ?[place] Agency: [place] ?Date HH Agency Contacted: 03/11/21 ?Time [place]: [phone] ?Representative spoke with at [place]: [place] ? ?Social Determinants of Health (SDOH) Interventions ?  ? ? ?Readmission Risk Interventions ?Readmission Risk Prevention Plan 03/11/2021  ?Transportation Screening Complete  ?PCP or Specialist Appt within 3-5 Days Complete  ?[place] or Home Care Consult Complete  ?Social Work Consult for [place] Planning/Counseling Complete  ?Palliative Care Screening Not Applicable  ?Medication Review Press photographer) Complete  ?Some recent data might be hidden  ? ? ? ? ? ?

## 2021-03-19 NOTE — TOC Transition Note (Signed)
Transition of care contact from inpatient facility ? ?Date of discharge: 03/17/21 ?Date of contact: 03/19/21 ?Method: Phone ?Spoke to: Patient ? ?Patient contacted to discuss transition of care from recent inpatient hospitalization. Patient was admitted to [facility] from 03/07/21-03/17/21 with discharge diagnosis of E Coli Bacteremia ? ?Medication changes were reviewed. Patient scheduled to receive Cefazolin 2g IV through 04/01/21. ? ?Patient will follow up with his outpatient HD unit on: Monday 03/21/21 at [facility]. ? ?Tobie Poet, NP ?  ?

## 2021-03-25 NOTE — Progress Notes (Signed)
Late entry  ? ?  ?Provided service recovery on patient. 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 Patient verbalized understanding. All questions have been answered. Attending MD and Infection Disease MD have been notified and will continue to follow patient.  ?  ?

## 2021-04-22 ENCOUNTER — Inpatient Hospital Stay (HOSPITAL_COMMUNITY)
Admission: EM | Admit: 2021-04-22 | Discharge: 2021-05-05 | DRG: 306 | Disposition: A | Discharge status: Hospice | Payer: Medicare Other | Attending: Internal Medicine | Admitting: Internal Medicine

## 2021-04-22 ENCOUNTER — Other Ambulatory Visit: Payer: Self-pay

## 2021-04-22 ENCOUNTER — Encounter (HOSPITAL_COMMUNITY): Payer: Self-pay

## 2021-04-22 ENCOUNTER — Emergency Department (HOSPITAL_COMMUNITY): Payer: Medicare Other

## 2021-04-22 DIAGNOSIS — I25110 Atherosclerotic heart disease of native coronary artery with unstable angina pectoris: Secondary | ICD-10-CM | POA: Diagnosis present

## 2021-04-22 DIAGNOSIS — Z7189 Other specified counseling: Secondary | ICD-10-CM

## 2021-04-22 DIAGNOSIS — Z79899 Other long term (current) drug therapy: Secondary | ICD-10-CM

## 2021-04-22 DIAGNOSIS — Z7982 Long term (current) use of aspirin: Secondary | ICD-10-CM

## 2021-04-22 DIAGNOSIS — N186 End stage renal disease: Secondary | ICD-10-CM | POA: Diagnosis present

## 2021-04-22 DIAGNOSIS — Z79890 Hormone replacement therapy (postmenopausal): Secondary | ICD-10-CM

## 2021-04-22 DIAGNOSIS — Z8614 Personal history of Methicillin resistant Staphylococcus aureus infection: Secondary | ICD-10-CM | POA: Diagnosis present

## 2021-04-22 DIAGNOSIS — I10 Essential (primary) hypertension: Secondary | ICD-10-CM | POA: Diagnosis present

## 2021-04-22 DIAGNOSIS — G4700 Insomnia, unspecified: Secondary | ICD-10-CM | POA: Diagnosis present

## 2021-04-22 DIAGNOSIS — Z8249 Family history of ischemic heart disease and other diseases of the circulatory system: Secondary | ICD-10-CM

## 2021-04-22 DIAGNOSIS — R45851 Suicidal ideations: Secondary | ICD-10-CM | POA: Diagnosis present

## 2021-04-22 DIAGNOSIS — Z87440 Personal history of urinary (tract) infections: Secondary | ICD-10-CM

## 2021-04-22 DIAGNOSIS — Z7902 Long term (current) use of antithrombotics/antiplatelets: Secondary | ICD-10-CM

## 2021-04-22 DIAGNOSIS — I2510 Atherosclerotic heart disease of native coronary artery without angina pectoris: Secondary | ICD-10-CM | POA: Diagnosis present

## 2021-04-22 DIAGNOSIS — D631 Anemia in chronic kidney disease: Secondary | ICD-10-CM | POA: Diagnosis present

## 2021-04-22 DIAGNOSIS — Z932 Ileostomy status: Secondary | ICD-10-CM

## 2021-04-22 DIAGNOSIS — Z992 Dependence on renal dialysis: Secondary | ICD-10-CM

## 2021-04-22 DIAGNOSIS — F419 Anxiety disorder, unspecified: Secondary | ICD-10-CM | POA: Diagnosis present

## 2021-04-22 DIAGNOSIS — Z833 Family history of diabetes mellitus: Secondary | ICD-10-CM

## 2021-04-22 DIAGNOSIS — F329 Major depressive disorder, single episode, unspecified: Secondary | ICD-10-CM | POA: Diagnosis present

## 2021-04-22 DIAGNOSIS — I739 Peripheral vascular disease, unspecified: Secondary | ICD-10-CM | POA: Diagnosis present

## 2021-04-22 DIAGNOSIS — R079 Chest pain, unspecified: Secondary | ICD-10-CM

## 2021-04-22 DIAGNOSIS — N132 Hydronephrosis with renal and ureteral calculous obstruction: Secondary | ICD-10-CM | POA: Diagnosis present

## 2021-04-22 DIAGNOSIS — I959 Hypotension, unspecified: Secondary | ICD-10-CM | POA: Diagnosis present

## 2021-04-22 DIAGNOSIS — I120 Hypertensive chronic kidney disease with stage 5 chronic kidney disease or end stage renal disease: Secondary | ICD-10-CM | POA: Diagnosis present

## 2021-04-22 DIAGNOSIS — Z6837 Body mass index (BMI) 37.0-37.9, adult: Secondary | ICD-10-CM

## 2021-04-22 DIAGNOSIS — I352 Nonrheumatic aortic (valve) stenosis with insufficiency: Principal | ICD-10-CM | POA: Diagnosis present

## 2021-04-22 DIAGNOSIS — G43909 Migraine, unspecified, not intractable, without status migrainosus: Secondary | ICD-10-CM | POA: Diagnosis present

## 2021-04-22 DIAGNOSIS — N319 Neuromuscular dysfunction of bladder, unspecified: Secondary | ICD-10-CM | POA: Diagnosis present

## 2021-04-22 DIAGNOSIS — I208 Other forms of angina pectoris: Secondary | ICD-10-CM

## 2021-04-22 DIAGNOSIS — Z87442 Personal history of urinary calculi: Secondary | ICD-10-CM

## 2021-04-22 DIAGNOSIS — L02416 Cutaneous abscess of left lower limb: Secondary | ICD-10-CM | POA: Diagnosis present

## 2021-04-22 DIAGNOSIS — Z5900 Homelessness unspecified: Secondary | ICD-10-CM

## 2021-04-22 DIAGNOSIS — T8189XA Other complications of procedures, not elsewhere classified, initial encounter: Secondary | ICD-10-CM | POA: Diagnosis present

## 2021-04-22 DIAGNOSIS — Z888 Allergy status to other drugs, medicaments and biological substances status: Secondary | ICD-10-CM

## 2021-04-22 DIAGNOSIS — R9389 Abnormal findings on diagnostic imaging of other specified body structures: Secondary | ICD-10-CM

## 2021-04-22 DIAGNOSIS — N2581 Secondary hyperparathyroidism of renal origin: Secondary | ICD-10-CM | POA: Diagnosis present

## 2021-04-22 DIAGNOSIS — E039 Hypothyroidism, unspecified: Secondary | ICD-10-CM | POA: Diagnosis present

## 2021-04-22 DIAGNOSIS — Z515 Encounter for palliative care: Secondary | ICD-10-CM

## 2021-04-22 DIAGNOSIS — E875 Hyperkalemia: Secondary | ICD-10-CM | POA: Diagnosis not present

## 2021-04-22 DIAGNOSIS — N99528 Other complication of other external stoma of urinary tract: Secondary | ICD-10-CM | POA: Diagnosis present

## 2021-04-22 DIAGNOSIS — E441 Mild protein-calorie malnutrition: Secondary | ICD-10-CM | POA: Diagnosis present

## 2021-04-22 DIAGNOSIS — F22 Delusional disorders: Secondary | ICD-10-CM | POA: Diagnosis present

## 2021-04-22 DIAGNOSIS — T83098A Other mechanical complication of other indwelling urethral catheter, initial encounter: Secondary | ICD-10-CM | POA: Diagnosis present

## 2021-04-22 DIAGNOSIS — R110 Nausea: Secondary | ICD-10-CM | POA: Diagnosis present

## 2021-04-22 DIAGNOSIS — Z885 Allergy status to narcotic agent status: Secondary | ICD-10-CM

## 2021-04-22 DIAGNOSIS — K219 Gastro-esophageal reflux disease without esophagitis: Secondary | ICD-10-CM | POA: Diagnosis present

## 2021-04-22 DIAGNOSIS — Z79891 Long term (current) use of opiate analgesic: Secondary | ICD-10-CM

## 2021-04-22 DIAGNOSIS — N189 Chronic kidney disease, unspecified: Secondary | ICD-10-CM | POA: Diagnosis present

## 2021-04-22 DIAGNOSIS — Z20822 Contact with and (suspected) exposure to covid-19: Secondary | ICD-10-CM | POA: Diagnosis present

## 2021-04-22 DIAGNOSIS — T829XXA Unspecified complication of cardiac and vascular prosthetic device, implant and graft, initial encounter: Secondary | ICD-10-CM | POA: Diagnosis present

## 2021-04-22 DIAGNOSIS — I350 Nonrheumatic aortic (valve) stenosis: Secondary | ICD-10-CM

## 2021-04-22 DIAGNOSIS — G8929 Other chronic pain: Secondary | ICD-10-CM | POA: Diagnosis present

## 2021-04-22 DIAGNOSIS — Z882 Allergy status to sulfonamides status: Secondary | ICD-10-CM

## 2021-04-22 DIAGNOSIS — Z936 Other artificial openings of urinary tract status: Secondary | ICD-10-CM

## 2021-04-22 DIAGNOSIS — Z89412 Acquired absence of left great toe: Secondary | ICD-10-CM

## 2021-04-22 DIAGNOSIS — Z66 Do not resuscitate: Secondary | ICD-10-CM | POA: Diagnosis not present

## 2021-04-22 DIAGNOSIS — G894 Chronic pain syndrome: Secondary | ICD-10-CM

## 2021-04-22 DIAGNOSIS — Q059 Spina bifida, unspecified: Secondary | ICD-10-CM

## 2021-04-22 LAB — CBC WITH DIFFERENTIAL/PLATELET
Abs Immature Granulocytes: 0.02 10*3/uL (ref 0.00–0.07)
Basophils Absolute: 0 10*3/uL (ref 0.0–0.1)
Basophils Relative: 0 %
Eosinophils Absolute: 0 10*3/uL (ref 0.0–0.5)
Eosinophils Relative: 1 %
HCT: 40.5 % (ref 39.0–52.0)
Hemoglobin: 12.6 g/dL — ABNORMAL LOW (ref 13.0–17.0)
Immature Granulocytes: 0 %
Lymphocytes Relative: 12 %
Lymphs Abs: 0.7 10*3/uL (ref 0.7–4.0)
MCH: 30.8 pg (ref 26.0–34.0)
MCHC: 31.1 g/dL (ref 30.0–36.0)
MCV: 99 fL (ref 80.0–100.0)
Monocytes Absolute: 0.4 10*3/uL (ref 0.1–1.0)
Monocytes Relative: 8 %
Neutro Abs: 4.2 10*3/uL (ref 1.7–7.7)
Neutrophils Relative %: 79 %
Platelets: UNDETERMINED 10*3/uL (ref 150–400)
RBC: 4.09 MIL/uL — ABNORMAL LOW (ref 4.22–5.81)
RDW: 15.1 % (ref 11.5–15.5)
WBC: 5.3 10*3/uL (ref 4.0–10.5)
nRBC: 0 % (ref 0.0–0.2)

## 2021-04-22 LAB — COMPREHENSIVE METABOLIC PANEL
ALT: 7 U/L (ref 0–44)
AST: 16 U/L (ref 15–41)
Albumin: 4 g/dL (ref 3.5–5.0)
Alkaline Phosphatase: 171 U/L — ABNORMAL HIGH (ref 38–126)
Anion gap: 15 (ref 5–15)
BUN: 8 mg/dL (ref 6–20)
CO2: 26 mmol/L (ref 22–32)
Calcium: 8.6 mg/dL — ABNORMAL LOW (ref 8.9–10.3)
Chloride: 97 mmol/L — ABNORMAL LOW (ref 98–111)
Creatinine, Ser: 4.68 mg/dL — ABNORMAL HIGH (ref 0.61–1.24)
GFR, Estimated: 14 mL/min — ABNORMAL LOW (ref >60)
Glucose, Bld: 78 mg/dL (ref 70–99)
Potassium: 3.6 mmol/L (ref 3.5–5.1)
Sodium: 138 mmol/L (ref 135–145)
Total Bilirubin: 1.1 mg/dL (ref 0.3–1.2)
Total Protein: 8.7 g/dL — ABNORMAL HIGH (ref 6.5–8.1)

## 2021-04-22 LAB — RESP PANEL BY RT-PCR (FLU A&B, COVID) ARPGX2
Influenza A by PCR: NEGATIVE
Influenza B by PCR: NEGATIVE
SARS Coronavirus 2 by RT PCR: NEGATIVE

## 2021-04-22 LAB — TROPONIN I (HIGH SENSITIVITY)
Troponin I (High Sensitivity): 27 ng/L — ABNORMAL HIGH (ref <18)
Troponin I (High Sensitivity): 27 ng/L — ABNORMAL HIGH (ref <18)

## 2021-04-22 LAB — ETHANOL: Alcohol, Ethyl (B): <10 mg/dL (ref <10)

## 2021-04-22 MED ORDER — ESCITALOPRAM OXALATE 10 MG PO TABS
5.0000 mg | ORAL_TABLET | Freq: Every day | ORAL | Status: DC
  Administered 2021-04-22 – 2021-05-02 (×10): 5 mg via ORAL
  Filled 2021-04-22 (×10): qty 1

## 2021-04-22 MED ORDER — CLOPIDOGREL BISULFATE 75 MG PO TABS: 75.0000 mg | ORAL_TABLET | Freq: Every day | ORAL | Status: DC

## 2021-04-22 MED ORDER — ACETAMINOPHEN 500 MG PO TABS: 1000.0000 mg | ORAL_TABLET | Freq: Four times a day (QID) | ORAL | Status: DC | PRN

## 2021-04-22 MED ORDER — FLUTICASONE PROPIONATE 50 MCG/ACT NA SUSP
2.0000 | NASAL | Status: DC | PRN
  Filled 2021-04-22: qty 16

## 2021-04-22 MED ORDER — DOXERCALCIFEROL 4 MCG/2ML IV SOLN
4.0000 ug | INTRAVENOUS | Status: DC
  Administered 2021-04-25 – 2021-04-27 (×2): 4 ug via INTRAVENOUS
  Filled 2021-04-22 (×6): qty 2

## 2021-04-22 MED ORDER — ZOLPIDEM TARTRATE 5 MG PO TABS
10.0000 mg | ORAL_TABLET | Freq: Every day | ORAL | Status: DC
  Administered 2021-04-22 – 2021-04-30 (×9): 10 mg via ORAL
  Filled 2021-04-22 (×9): qty 2

## 2021-04-22 MED ORDER — OXYCODONE-ACETAMINOPHEN 5-325 MG PO TABS
1.0000 | ORAL_TABLET | Freq: Four times a day (QID) | ORAL | Status: DC | PRN
  Administered 2021-04-22 – 2021-04-25 (×6): 1 via ORAL
  Filled 2021-04-22 (×6): qty 1

## 2021-04-22 MED ORDER — ONDANSETRON 4 MG PO TBDP
4.0000 mg | ORAL_TABLET | Freq: Three times a day (TID) | ORAL | Status: DC | PRN
  Administered 2021-04-26 – 2021-05-01 (×2): 4 mg via ORAL
  Filled 2021-04-22 (×2): qty 1

## 2021-04-22 MED ORDER — CINACALCET HCL 30 MG PO TABS
30.0000 mg | ORAL_TABLET | ORAL | Status: DC
  Administered 2021-04-27 – 2021-04-29 (×2): 30 mg via ORAL
  Filled 2021-04-22 (×2): qty 1

## 2021-04-22 MED ORDER — MELATONIN 5 MG PO TABS
10.0000 mg | ORAL_TABLET | Freq: Every evening | ORAL | Status: DC | PRN
  Administered 2021-04-22 – 2021-05-01 (×5): 10 mg via ORAL
  Filled 2021-04-22 (×5): qty 2

## 2021-04-22 MED ORDER — LEVOTHYROXINE SODIUM 25 MCG PO TABS
137.0000 ug | ORAL_TABLET | Freq: Every day | ORAL | Status: DC
  Administered 2021-04-24 – 2021-05-02 (×9): 137 ug via ORAL
  Filled 2021-04-22 (×11): qty 1

## 2021-04-22 MED ORDER — FUROSEMIDE 20 MG PO TABS: 80.0000 mg | ORAL_TABLET | ORAL | Status: DC

## 2021-04-22 MED ORDER — FUROSEMIDE 40 MG PO TABS
80.0000 mg | ORAL_TABLET | Freq: Every day | ORAL | Status: DC
  Administered 2021-04-23 – 2021-04-28 (×5): 80 mg via ORAL
  Filled 2021-04-22 (×2): qty 4
  Filled 2021-04-22 (×2): qty 2
  Filled 2021-04-22: qty 4
  Filled 2021-04-22: qty 2

## 2021-04-22 MED ORDER — ACETAMINOPHEN 325 MG PO TABS
650.0000 mg | ORAL_TABLET | Freq: Four times a day (QID) | ORAL | Status: DC | PRN
  Administered 2021-04-25 (×2): 650 mg via ORAL
  Filled 2021-04-22 (×2): qty 2

## 2021-04-22 MED ORDER — TRAZODONE HCL 50 MG PO TABS
100.0000 mg | ORAL_TABLET | Freq: Every evening | ORAL | Status: DC | PRN
  Administered 2021-04-22 – 2021-04-27 (×4): 100 mg via ORAL
  Filled 2021-04-22 (×4): qty 2

## 2021-04-22 MED ORDER — FAMOTIDINE 20 MG PO TABS
40.0000 mg | ORAL_TABLET | Freq: Every day | ORAL | Status: DC
  Administered 2021-04-22 – 2021-05-02 (×10): 40 mg via ORAL
  Filled 2021-04-22 (×10): qty 2

## 2021-04-22 MED ORDER — CLOPIDOGREL BISULFATE 75 MG PO TABS
75.0000 mg | ORAL_TABLET | Freq: Every day | ORAL | Status: DC
  Administered 2021-04-23 – 2021-05-02 (×10): 75 mg via ORAL
  Filled 2021-04-22 (×10): qty 1

## 2021-04-22 MED ORDER — POLYETHYLENE GLYCOL 3350 17 G PO PACK
17.0000 g | PACK | Freq: Every day | ORAL | Status: DC
  Administered 2021-04-23: 17 g via ORAL
  Filled 2021-04-22 (×6): qty 1

## 2021-04-22 NOTE — ED Notes (Signed)
Pt wants to ensure that staff and careteam are aware that the pain in his left knee is acute and worsening. RN notified.  ?

## 2021-04-22 NOTE — ED Notes (Signed)
Pt changed to burgundy scrubs, wanded by security. Pt reports suicidal ideation with plan. States "I literally have nowhere to go. My cousin who I live with just left me. I am from [place] and I moved here because of my cousin. I'm thinking I will just open this (pointing at femoral dialysis catheter) and slowly bleed out". Belongings (2 bags) in locker 11, and valuables with security. ?

## 2021-04-22 NOTE — ED Provider Notes (Signed)
?[place] ?Provider Note ? ? ?CSN: [id number] ?Arrival date & time: 04/22/21  1405 ? ?  ? ?History ? ?Chief Complaint  ?Patient presents with  ? Suicidal  ? Chest Pain  ? ? ?[name] is a 56 y.o. male. ? ?HPI ? ?Patient is a 56 year old male presented emergency room today with complaints of chest pain.  He states that he was recently forced into homelessness by unfortunate circumstances.  States that he has been very depressed lately he states that he has had chest pain ongoing for "quite some time " ? ?Patient denies any shortness of breath nausea vomiting diaphoresis.  No history of ACS.  He is an ESRD patient on Monday Wednesday Friday dialysis he received dialysis full course earlier today.  Has a history of HTN, anemia, anxiety, reflux, spina bifida, aortic stenosis.  He had a catheterization done 10/17/2019 with aggressive preventative therapy recommended but no stents placed. ? ? ?  ? ?Home Medications ?Prior to Admission medications   ?Medication Sig Start Date End Date Taking? Authorizing Provider  ?acetaminophen (TYLENOL) 500 MG tablet Take 1,000 mg by mouth every 6 (six) hours as needed for mild pain or headache.    [provider]  ?aspirin EC 325 MG tablet Take 325 mg by mouth daily.    [provider]  ?calcium acetate (PHOSLO) 667 MG capsule Take 3-4 capsules (2,001-2,668 mg total) by mouth See admin instructions. Take 2708 mg by mouth 3 times daily with meals, take 2001 mg by mouth with snacks ?Patient taking differently: Take 2,668 mg by mouth 3 (three) times daily with meals. 01/14/18   Hennie Duos, MD  ?cinacalcet [facility]) 30 MG tablet Take 1 tablet (30 mg total) by mouth every Monday, Wednesday, and Friday with hemodialysis. 02/18/20   Lacinda Axon, MD  ?clopidogrel (PLAVIX) 75 MG tablet Take 75 mg by mouth daily.    [provider]  ?diclofenac Sodium (VOLTAREN) 1 % GEL Apply 2 g topically daily as needed  (For leg pain).    [provider]  ?doxercalciferol (HECTOROL) 4 MCG/2ML injection Inject 2 mLs (4 mcg total) into the vein every Monday, Wednesday, and Friday with hemodialysis. 02/18/20   Lacinda Axon, MD  ?escitalopram (LEXAPRO) 5 MG tablet Take 5 mg by mouth daily.    [provider]  ?famotidine (PEPCID) 40 MG tablet Take 40 mg by mouth daily. 09/01/10   [provider]  ?fluticasone (FLONASE) 50 MCG/ACT nasal spray Place 2 sprays into both nostrils as needed for allergies or rhinitis (congestion). ?Patient taking differently: Place 2 sprays into both nostrils daily as needed for allergies or rhinitis (congestion). 01/14/18   Hennie Duos, MD  ?furosemide (LASIX) 80 MG tablet Take 80-160 mg by mouth See admin instructions. Take 80 mg daily and may take an extra 80 mg if needed for fluid 04/20/20   [provider]  ?levothyroxine (SYNTHROID) 137 MCG tablet Take 137 mcg by mouth daily before breakfast.    [provider]  ?LORazepam (ATIVAN) 1 MG tablet Take 1 mg by mouth 2 (two) times daily as needed for anxiety.    [provider]  ?Melatonin 10 MG TABS Take 10 mg by mouth at bedtime as needed (sleep).    [provider]  ?Nutritional Supplements (NEPRO) LIQD Take 237 mLs by mouth daily. Mixed Berry    [provider]  ?oxyCODONE-acetaminophen (PERCOCET) 10-325 MG tablet Take 1 tablet by mouth every 6 (six)  hours as needed for pain. 02/04/21   [provider]  ?polyethylene glycol (MIRALAX / GLYCOLAX) 17 g packet Take 17 g by mouth daily. ?Patient taking differently: Take 17 g by mouth daily as needed for mild constipation. 02/18/20   Lacinda Axon, MD  ?promethazine (PHENERGAN) 25 MG tablet Take 1 tablet (25 mg total) by mouth 2 (two) times daily as needed for nausea or vomiting. 01/14/18   Hennie Duos, MD  ?rosuvastatin (CRESTOR) 10 MG tablet Take 1 tablet (10 mg total) by mouth daily. 10/18/19 03/07/21  Lyndee Hensen, DO  ?senna (SENOKOT) 8.6 MG TABS tablet Take 2 tablets (17.2 mg total) by mouth daily as needed for mild constipation. 02/18/20   Lacinda Axon, MD  ?sodium polystyrene (KAYEXALATE) powder Take 15 g by mouth See admin instructions. Take 15 g (mixed in water) by mouth on Saturdays and Sundays ?Patient taking differently: Take 15 g by mouth See admin instructions. Take 15 g (mixed in water) by mouth on Saturdays and Sundays & Tuesday, Thursday. 01/14/18   Hennie Duos, MD  ?traZODone (DESYREL) 100 MG tablet Take 1 tablet (100 mg total) by mouth at bedtime. 01/14/18   Hennie Duos, MD  ?zolpidem (AMBIEN) 10 MG tablet Take 1 tablet (10 mg total) by mouth at bedtime. 01/14/18   Hennie Duos, MD  ?   ? ?Allergies    ?Hydrocodone, Furadantin [nitrofurantoin], Mandelamine [methenamine], Noroxin [norfloxacin], Colace [docusate], Elemental sulfur, and Sulfa antibiotics   ? ?Review of Systems   ?Review of Systems ? ?Physical Exam ?Updated Vital Signs ?BP (!) 100/58 (BP Location: Left Arm)   Pulse (!) 57   Temp 99.8 ?F (37.7 ?C) (Oral)   Resp 16   Ht 5\' 11"  (1.803 m)   Wt 123 kg   SpO2 95%   BMI 37.82 kg/m?  ?Physical Exam ?Vitals and nursing note reviewed.  ?Constitutional:   ?   General: He is not in acute distress. ?   Appearance: He is obese.  ?HENT:  ?   Head: Normocephalic and atraumatic.  ?   Nose: Nose normal.  ?Eyes:  ?   General: No scleral icterus. ?Cardiovascular:  ?   Rate and Rhythm: Normal rate and regular rhythm.  ?   Pulses: Normal pulses.  ?   Heart sounds: Normal heart sounds.  ?   Comments: Vascular access for dialysis is located in right lower extremity ?Pulmonary:  ?   Effort: Pulmonary effort is normal. No respiratory distress.  ?   Breath sounds: No wheezing.  ?Abdominal:  ?   Palpations: Abdomen is soft.  ?   Tenderness: There is no abdominal tenderness. There is no guarding or rebound.  ?Musculoskeletal:  ?   Cervical back: Normal range of motion.  ?   Right lower leg:  No edema.  ?   Left lower leg: No edema.  ?Skin: ?   General: Skin is warm and dry.  ?   Capillary Refill: Capillary refill takes less than 2 seconds.  ?Neurological:  ?   Mental Status: He is alert. Mental status is at baseline.  ?Psychiatric:     ?   Mood and Affect: Mood normal.     ?   Behavior: Behavior normal.  ?   Comments: Depressed mood, flat affect  ? ? ?ED Results / Procedures / Treatments   ?Labs ?(all labs ordered are listed, but only abnormal results are displayed) ?Labs Reviewed  ?COMPREHENSIVE METABOLIC PANEL - Abnormal; Notable  for the following components:  ?    Result Value  ? Chloride 97 (*)   ? Creatinine, Ser 4.68 (*)   ? Calcium 8.6 (*)   ? Total Protein 8.7 (*)   ? Alkaline Phosphatase 171 (*)   ? GFR, Estimated 14 (*)   ? All other components within normal limits  ?CBC WITH DIFFERENTIAL/PLATELET - Abnormal; Notable for the following components:  ? RBC 4.09 (*)   ? Hemoglobin 12.6 (*)   ? All other components within normal limits  ?TROPONIN I (HIGH SENSITIVITY) - Abnormal; Notable for the following components:  ? Troponin I (High Sensitivity) 27 (*)   ? All other components within normal limits  ?TROPONIN I (HIGH SENSITIVITY) - Abnormal; Notable for the following components:  ? Troponin I (High Sensitivity) 27 (*)   ? All other components within normal limits  ?RESP PANEL BY RT-PCR (FLU A&B, COVID) [id number]  ?ETHANOL  ?RAPID URINE DRUG SCREEN, HOSP PERFORMED  ? ? ?EKG ?None ? ?Radiology ?No results found. ? ?Procedures ?Procedures  ? ? ?Medications Ordered in ED ?Medications - No data to display ? ?ED Course/ Medical Decision Making/ A&P ?  ?                        ?Medical Decision Making ?Amount and/or Complexity of Data Reviewed ?Labs: ordered. ?Radiology: ordered. ? ?Risk ?OTC drugs. ?Prescription drug management. ? ? ?This patient presents to the ED for concern of CP and depression w suicidality, this involves a number of treatment options, and is a complaint that carries with it a  moderate to high risk of complications and morbidity.  The differential diagnosis includes suicide, The emergent causes of chest pain include: Acute coronary syndrome, tamponade, pericarditis/myocarditis, aortic dissect

## 2021-04-22 NOTE — BH Assessment (Signed)
Clinician reviewed pt's chart in preparation to complete pt's MH Assessment. However, as of 1907, it is unknown if medically cleared at this time. TTS to attempt assessment at a later time after medical clearance has been documented. ?

## 2021-04-22 NOTE — ED Notes (Signed)
Pt given [place] sandwich bag and soda per MD approval ?

## 2021-04-22 NOTE — ED Triage Notes (Signed)
Pt BIB [facility] from home c/o SI and CP that started when EMS arrived. Pt states he has not ate in a week. Pt is a dialysis pt M,W,F. Pt states he completed a full treatment today.  ? ? ?99/51 ?39 ? ?

## 2021-04-22 NOTE — BH Assessment (Signed)
Comprehensive Clinical Assessment (CCA) Note ? ?04/22/2021 ?[name] ?[id number] ? ?DISPOSITION: Gave clinical report to [name], NP who recommended inpatient psychiatric treatment. Pt will need facility that can accommodate his medical needs. Notified [name] and [name], RN of recommendation via secure message. ? ?The patient demonstrates the following risk factors for suicide: Chronic risk factors for suicide include: medical illness ESRD on HD, GERD, HTN, recurrent kidney stones, aortic stenosis, spina bifida, anemia . Acute risk factors for suicide include: family or marital conflict and loss (financial, interpersonal, professional). Protective factors for this patient include: positive social support. Considering these factors, the overall suicide risk at this point appears to be high. Patient is not appropriate for outpatient follow up. ? ?[facility] from 04/22/2021 in [place] [facility] to Hosp-Admission (Discharged) from 01/27/2020 in [place]  ?C-SSRS RISK CATEGORY High Risk No Risk  ? ?  ? ?Pt is a 56 year old single male who presents unaccompanied to [name] reporting symptoms of depression, anxiety, and suicidal ideation. Pt reports he has received counseling for anxiety in the past. Pt has a history of multiple medical problems since age 2 and has been on disability since 81. He says he cousin "[name]" has lived with Pt for the past 15 years but on 04/03/2021 she left and "she hates me." He says he has been evicted from their shared residence and is now homeless with nowhere to go. He needs assistance with ADLs, requires dialysis three times per week, and feels hopeless. Pt is tearful and says, "I am hanging on by a thread." He reports current suicidal ideation with plan to pull out his catheter and "bleed out." He says he is keeping his hands off the catheter "because I don't trust myself." He states that  if he leaves the hospital he will kill himself. He says he has contemplated suicide but never attempted. He describes his mood as depressed and anxious and acknowledges symptoms including crying spells, social withdrawal, loss of interest in usual pleasures, fatigue, irritability, decreased appetite and feelings of worthlessness and hopelessness. He reports sleeping 2-3 hours per night and states he has "night terrors." Pt denies any history of intentional self-injurious behaviors. Pt denies current homicidal ideation or history of violence. Pt denies any history of auditory or visual hallucinations. Pt denies history of alcohol or other substance use. ? ?Pt identifies his living situation as his primary stressor. He also says he has chronic pain. He states he receives benefits from his deceased father and he has financial resources but needs assistance. He says he can do most ADLs independently but cannot completely bath himself, needs assistance with house cleaning, and ambulates with a walker or wheelchair. He says he does not have any social supports who live locally. He reports a maternal family history of mental health problems. He denies history of abuse. He denies legal problems. He denies access to firearms. Pt reports a history of outpatient counseling but no history of inpatient psychiatric treatment.  ? ?Pt is dressed in hospital scrubs, disheveled, alert and oriented x4. Pt speaks in a clear tone, at moderate volume and normal pace. Motor behavior appears normal. Eye contact is good and Pt is tearful at times. Pt's mood is depressed and anxious; affect is congruent with mood. Thought process is coherent and relevant. There is no indication Pt is currently responding to internal stimuli or experiencing delusional thought content. Pt is cooperative and requesting any  assistance that can be provided. ? ?Chief Complaint:  ?Chief Complaint  ?Patient presents with  ? Suicidal  ? Chest Pain  ? ?Visit  Diagnosis: F32.2 Major depressive disorder, Single episode, Severe ? ? ?CCA Screening, Triage and Referral (STR) ? ?Patient Reported Information ?How did you hear about [place]? Self ? ?Referral name: No data recorded ?Referral phone number: No data recorded ? ?Whom do you see for routine medical problems? No data recorded ?Practice/Facility Name: No data recorded ?Practice/Facility Phone Number: No data recorded ?Name of Contact: No data recorded ?Contact Number: No data recorded ?Contact Fax Number: No data recorded ?Prescriber Name: No data recorded ?Prescriber Address (if known): No data recorded ? ?What Is the Reason for Your Visit/Call Today? Pt reports he has multiple medical issues. He says his cousin, who has lived with him for 15 years, left and he was evicted today. He reports current suicidal ideation with plan to remove his catheter and "bleed out." He currently cannot contract for safety outside a facility. ? ?How Long Has This Been Causing You Problems? 1 wk - 1 month ? ?What Do You Feel Would Help You the Most Today? Treatment for Depression or other mood problem; Housing Assistance ? ? ?Have You Recently Been in Any Inpatient Treatment (Hospital/Detox/Crisis Center/28-Day Program)? No data recorded ?Name/Location of Program/Hospital:No data recorded ?How Long Were You There? No data recorded ?When Were You Discharged? No data recorded ? ?Have You Ever Received Services From [employer] Before? No data recorded ?Who Do You See at [facility]? No data recorded ? ?Have You Recently Had Any Thoughts About Hurting Yourself? Yes ? ?Are You Planning to Commit Suicide/Harm Yourself At This time? Yes ? ? ?Have you Recently Had Thoughts About [place]? No ? ?Explanation: No data recorded ? ?Have You Used Any Alcohol or Drugs in the Past 24 Hours? No ? ?How Long Ago Did You Use Drugs or Alcohol? No data recorded ?What Did You Use and How Much? No data recorded ? ?Do You Currently Have a  Therapist/Psychiatrist? No ? ?Name of Therapist/Psychiatrist: No data recorded ? ?Have You Been Recently Discharged From Any Office Practice or Programs? No ? ?Explanation of Discharge From Practice/Program: No data recorded ? ?  ?CCA Screening Triage Referral Assessment ?Type of Contact: Tele-Assessment ? ?Is this Initial or Reassessment? Initial Assessment ? ?Date Telepsych consult ordered in [facility]:  04/22/21 ? ?Time Telepsych consult ordered in [facility]:  1708 ? ? ?Patient Reported Information Reviewed? No data recorded ?Patient Left Without Being Seen? No data recorded ?Reason for Not Completing Assessment: No data recorded ? ?Collateral Involvement: None ? ? ?Does Patient Have a Stage manager Guardian? No data recorded ?Name and Contact of Legal Guardian: No data recorded ?If Minor and Not Living with Parent(s), Who has Custody? NA ? ?Is CPS involved or ever been involved? Never ? ?Is APS involved or ever been involved? In the past ? ? ?Patient Determined To Be At Risk for Harm To Self or Others Based on Review of Patient Reported Information or Presenting Complaint? Yes, for Self-Harm ? ?Method: No data recorded ?Availability of Means: No data recorded ?Intent: No data recorded ?Notification Required: No data recorded ?Additional Information for Danger to Others Potential: No data recorded ?Additional Comments for Danger to Others Potential: No data recorded ?Are There Guns or Other Weapons in [place]? No data recorded ?Types of Guns/Weapons: No data recorded ?Are These Weapons Safely Secured?  No data recorded ?Who Could Verify You Are Able To Have These Secured: No data recorded ?Do You Have any Outstanding Charges, Pending Court Dates, Parole/Probation? No data recorded ?Contacted To Inform of Risk of Harm To Self or Others: Unable to Contact: ? ? ?Location of Assessment: [facility] ED ? ? ?Does Patient Present under Involuntary Commitment? No ? ?IVC Papers Initial File Date: No data  recorded ? ?[place] of Residence: Kathleen Argue ? ? ?Patient Currently Receiving the Following Services: Not Receiving Services ? ? ?Determination of Need: Emergent (2 hours) ? ? ?Options For Referral: Inpatient Hospitalization ? ? ?

## 2021-04-23 MED ORDER — LIDOCAINE 5 % EX PTCH
1.0000 | MEDICATED_PATCH | CUTANEOUS | Status: DC
  Administered 2021-04-23 – 2021-05-02 (×9): 1 via TRANSDERMAL
  Filled 2021-04-23 (×11): qty 1

## 2021-04-23 NOTE — Progress Notes (Signed)
CSW requested [facility] AC to review. Pt meets inpatient behavioral health placement Prescott Gum, NP. ? ? ?Benjaman Kindler, MSW, LCSWA ?04/23/2021 12:59 AM ? ? ?

## 2021-04-23 NOTE — Progress Notes (Signed)
Patient meets criteria for inpatient treatment per [facility]. No appropriate beds at [facility] currently. CSW faxed referrals to the following facilities for review: ? ? ?[facility]   ?[facility]   ?[facility]   ?[facility]   ?[facility]   ?[facility]   ?[facility]  ? ? ?TTS will continue to seek bed placement.  ? ? ? ?Darletta Moll MSW, LCSW ?Clincal Social Worker  ?[facility]  ?

## 2021-04-23 NOTE — Consult Note (Signed)
Reason for Consult: ESRD ?Referring Physician:  [name] ? ?Chief Complaint: chest pain and suicidal ideation ? ?OP dialysis Orders:  ?MWF - [place] ([place] ?4hrs40min, BFR 350, DFR 800,  EDW 118-118.5 (121 kg needs to be updated), 2K/ 2Ca ?Heparin 3700 units IV ?Mircera 50 last given 4/12 ?Hectorol 24mcg IV qHD ?Sensipar 90mg   ?Phoslo 667mg  4 capsules with meals and 3 with snacks ? ?Assessment/Plan: ?Suicidal ideation: cousin moved out recently and landlord raising rent. Patient will be essentially homeless. ?ESRD - on HD MWF. HD on schedule; treated 3x last week leaving lower than listed EDW of 121kg) (History of recurrent hyperkalemia on p.o. Kayexalate at home). TDC MUST be packed with cath flo q treatment.  No absolute indication for RRT and the patient appears to be  comfortable. Next HD on Monday. ? ?Chronic R flank discomfort 2/2 to chronic nephrolithiasis. Pain management per primary.  ?Hypertension/volume  -BP on the low side 96-122/46-65 on midodrine.  ?Anemia of CKD -Hgb 12.6 Mircera 50 (last given 4/12) ?Secondary Hyperparathyroidism - Recheck phos to adjust binders. Continue Hectorol and sensipar.  ?Nutrition - Renal diet with fluid restriction. Needs protein supplements ?Severe AS-followed by Dr. Angelena Form. ?  ?HPI: [name] is an 56 y.o. male with ESRD on HD MWF at [facility]. His past medical history significant for seizure disorder, migraine HA, hyperkalemia, GERD, HTN, recurrent kidney stones, aortic stenosis, spina bifida, anemia, obesity, and anxiety.  Patient presenting with chest pain and suicidal ideation. Chest pain has been for a few weeks and he is stressed because his cousin moved out and landlord is raising rent. He states that he would essentially become homeless; he is thinking of harming himself by pulling his catheter out. He has chronic pain and is on pain medication. In the ED he has a small perciardial effusion. He states that his  overall body aches and being warm but denies nausea, vomiting. ? ?ROS ?Pertinent items are noted in HPI. ? ?Chemistry and CBC: ?Creatinine, Ser  ?Date/Time Value Ref Range Status  ?04/22/2021 02:37 PM 4.68 (H) 0.61 - 1.24 mg/dL Final  ?03/16/2021 07:24 AM 10.60 (H) 0.61 - 1.24 mg/dL Final  ?03/14/2021 11:19 AM 12.58 (H) 0.61 - 1.24 mg/dL Final  ?03/13/2021 01:01 AM 10.09 (H) 0.61 - 1.24 mg/dL Final  ?03/12/2021 02:11 AM 8.64 (H) 0.61 - 1.24 mg/dL Final  ?03/11/2021 04:24 AM 9.10 (H) 0.61 - 1.24 mg/dL Final  ?03/10/2021 04:24 AM 11.43 (H) 0.61 - 1.24 mg/dL Final  ?03/09/2021 06:03 AM 9.85 (H) 0.61 - 1.24 mg/dL Final  ?03/08/2021 06:31 AM 8.60 (H) 0.61 - 1.24 mg/dL Final  ?03/07/2021 11:25 AM 7.25 (H) 0.61 - 1.24 mg/dL Final  ?02/21/2020 01:29 AM 8.29 (H) 0.61 - 1.24 mg/dL Final  ?02/20/2020 04:19 AM 10.79 (H) 0.61 - 1.24 mg/dL Final  ?02/19/2020 02:51 AM 8.16 (H) 0.61 - 1.24 mg/dL Final  ?02/18/2020 02:31 AM 8.51 (H) 0.61 - 1.24 mg/dL Final  ?02/17/2020 01:45 AM 10.86 (H) 0.61 - 1.24 mg/dL Final  ?02/16/2020 11:20 AM 15.44 (H) 0.61 - 1.24 mg/dL Final  ?02/15/2020 04:00 AM 13.59 (H) 0.61 - 1.24 mg/dL Final  ?02/14/2020 04:29 AM 11.53 (H) 0.61 - 1.24 mg/dL Final  ?02/13/2020 03:03 AM 12.41 (H) 0.61 - 1.24 mg/dL Final  ?02/12/2020 03:22 AM 11.33 (H) 0.61 - 1.24 mg/dL Final  ?02/11/2020 01:48 AM 12.67 (H) 0.61 - 1.24 mg/dL Final  ?02/10/2020 02:58 AM 10.89 (H) 0.61 - 1.24 mg/dL Final  ?02/09/2020 01:50 AM 12.60 (  H) 0.61 - 1.24 mg/dL Final  ?02/08/2020 01:43 AM 10.90 (H) 0.61 - 1.24 mg/dL Final  ?02/07/2020 02:36 PM 9.87 (H) 0.61 - 1.24 mg/dL Final  ?02/07/2020 02:03 AM 13.95 (H) 0.61 - 1.24 mg/dL Final  ?02/06/2020 03:02 PM 12.90 (H) 0.61 - 1.24 mg/dL Final  ?02/06/2020 12:26 PM 13.00 (H) 0.61 - 1.24 mg/dL Final  ?02/05/2020 03:01 AM 14.31 (H) 0.61 - 1.24 mg/dL Final  ?02/04/2020 05:48 AM 12.70 (H) 0.61 - 1.24 mg/dL Final  ?02/03/2020 03:08 AM 13.73 (H) 0.61 - 1.24 mg/dL Final  ?02/02/2020 01:01 AM 13.43 (H) 0.61 - 1.24  mg/dL Final  ?02/01/2020 01:08 AM 11.64 (H) 0.61 - 1.24 mg/dL Final  ?01/31/2020 03:30 AM 12.36 (H) 0.61 - 1.24 mg/dL Final  ?01/30/2020 03:06 PM 11.55 (H) 0.61 - 1.24 mg/dL Final  ?01/30/2020 05:08 AM 11.32 (H) 0.61 - 1.24 mg/dL Final  ?01/29/2020 06:25 AM 13.00 (H) 0.61 - 1.24 mg/dL Final  ?01/28/2020 11:30 PM 12.53 (H) 0.61 - 1.24 mg/dL Final  ?01/28/2020 01:24 PM 11.49 (H) 0.61 - 1.24 mg/dL Final  ?01/27/2020 03:34 PM 10.35 (H) 0.61 - 1.24 mg/dL Final  ?01/16/2020 10:10 AM 2.18 (H) 0.61 - 1.24 mg/dL Final  ?01/16/2020 06:05 AM 10.08 (H) 0.61 - 1.24 mg/dL Final  ?01/15/2020 03:37 AM 8.05 (H) 0.61 - 1.24 mg/dL Final  ?01/14/2020 09:30 AM 7.11 (H) 0.61 - 1.24 mg/dL Final  ?01/14/2020 08:23 AM 9.59 (H) 0.61 - 1.24 mg/dL Final  ?01/14/2020 03:08 AM 10.06 (H) 0.61 - 1.24 mg/dL Final  ?01/13/2020 08:25 AM 9.32 (H) 0.61 - 1.24 mg/dL Final  ?01/12/2020 03:22 AM 11.41 (H) 0.61 - 1.24 mg/dL Final  ?01/11/2020 04:07 AM 9.40 (H) 0.61 - 1.24 mg/dL Final  ?01/10/2020 02:12 AM 7.01 (H) 0.61 - 1.24 mg/dL Final  ?01/09/2020 08:03 AM 9.15 (H) 0.61 - 1.24 mg/dL Final  ?01/09/2020 05:14 AM 8.93 (H) 0.61 - 1.24 mg/dL Final  ? ?Recent Labs  ?Lab 04/22/21 ?1437  ?NA 138  ?K 3.6  ?CL 97*  ?CO2 26  ?GLUCOSE 78  ?BUN 8  ?CREATININE 4.68*  ?CALCIUM 8.6*  ? ?Recent Labs  ?Lab 04/22/21 ?1437  ?WBC 5.3  ?NEUTROABS 4.2  ?HGB 12.6*  ?HCT 40.5  ?MCV 99.0  ?PLT PLATELET CLUMPS NOTED ON SMEAR, UNABLE TO ESTIMATE  ? ?Liver Function Tests: ?Recent Labs  ?Lab 04/22/21 ?1437  ?AST 16  ?ALT 7  ?ALKPHOS 171*  ?BILITOT 1.1  ?PROT 8.7*  ?ALBUMIN 4.0  ? ?No results for input(s): LIPASE, AMYLASE in the last 168 hours. ?No results for input(s): AMMONIA in the last 168 hours. ?Cardiac Enzymes: ?No results for input(s): CKTOTAL, CKMB, CKMBINDEX, TROPONINI in the last 168 hours. ?Iron Studies: No results for input(s): IRON, TIBC, TRANSFERRIN, FERRITIN in the last 72 hours. ?PT/INR: ?@LABRCNTIP (inr:5) ? ?Xrays/Other Studies: ?) ?Results for orders placed or  performed during the hospital encounter of 04/22/21 (from the past 48 hour(s))  ?Resp Panel by RT-PCR (Flu A&B, Covid) Nasopharyngeal Swab     Status: None  ? Collection Time: 04/22/21  2:25 PM  ? Specimen: Nasopharyngeal Swab; Nasopharyngeal(NP) swabs in vial transport medium  ?Result Value Ref Range  ? SARS Coronavirus 2 by RT PCR NEGATIVE NEGATIVE  ?  Comment: (NOTE) ?SARS-CoV-2 target nucleic acids are NOT DETECTED. ? ?The SARS-CoV-2 RNA is generally detectable in upper respiratory ?specimens during the acute phase of infection. The lowest ?concentration of SARS-CoV-2 viral copies this assay can detect is ?138 copies/mL. A negative result does not preclude SARS-Cov-2 ?infection and should  not be used as the sole basis for treatment or ?other patient management decisions. A negative result may occur with  ?improper specimen collection/handling, submission of specimen other ?than nasopharyngeal swab, presence of viral mutation(s) within the ?areas targeted by this assay, and inadequate number of viral ?copies(<138 copies/mL). A negative result must be combined with ?clinical observations, patient history, and epidemiological ?information. The expected result is Negative. ? ?Fact Sheet for Patients:  ?EntrepreneurPulse.com.au ? ?Fact Sheet for Healthcare Providers:  ?IncredibleEmployment.be ? ?This test is no t yet approved or cleared by the [place] FDA and  ?has been authorized for detection and/or diagnosis of SARS-CoV-2 by ?FDA under an Emergency Use Authorization (EUA). This EUA will remain  ?in effect (meaning this test can be used) for the duration of the ?COVID-19 declaration under Section 564(b)(1) of the Act, 21 ?U.S.C.section 360bbb-3(b)(1), unless the authorization is terminated  ?or revoked sooner.  ? ? ?  ? Influenza A by PCR NEGATIVE NEGATIVE  ? Influenza B by PCR NEGATIVE NEGATIVE  ?  Comment: (NOTE) ?The Xpert Xpress SARS-CoV-2/FLU/RSV plus assay is intended  as an aid ?in the diagnosis of influenza from Nasopharyngeal swab specimens and ?should not be used as a sole basis for treatment. Nasal washings and ?aspirates are unacceptable for Xpert Xpress SARS-CoV-2/FL

## 2021-04-23 NOTE — ED Provider Notes (Signed)
Emergency Medicine Observation Re-evaluation Note ? ?[name] is a 56 y.o. male, seen on rounds today.  Pt initially presented to the ED for complaints of Suicidal and Chest Pain ?Currently, the patient is concerned about his housing instability. No current SI. No chest pain or sob.  ? ?Physical Exam  ?BP 132/67 (BP Location: Right Arm)   Pulse 64   Temp 99.2 ?F (37.3 ?C) (Oral)   Resp 20   Ht 1.803 m (5\' 11" )   Wt 123 kg   SpO2 100%   BMI 37.82 kg/m?  ?Physical Exam ?General: alert, content, conversant.  ?Cardiac: regular rate.  ?Lungs: breathing comfortably. ?Psych: alert, content, conversant. Normal appetite. No trouble sleeping. Pt has normal mood and affect. No SI/HI. Pt does not appear acutely depressed or despondent. He does express frustration about his multiple, chronic medical issues and new housing instability/homelessness. Pt is not responding to internal stimuli, no delusions or hallucinations noted.  ? ?ED Course / MDM  ? ? ?I have reviewed the labs performed to date as well as medications administered while in observation.  Recent changes in the last 24 hours include ED obs, reassessment.  ? ?Plan  ? ? [name] is not under involuntary commitment. ? ?Pt is not acutely depressed or suicidal. He is frustrated that his cousin is no longer supporting him, and that he is newly homeless. Pt is psychiatrically clear. He likely would benefit from outpatient therapy or counseling in terms of dealing with stress, and the stress associated with his chronic health issues.  ? ?Nephrology has been consulted for his dialysis while here.  ? ?As a positive, pt does have a couple payer sources, and expresses his desire to live independently, but that due to not having credit/positive credit - he is having trouble securing apartment or other housing. Will consult toc to assist with issue.  ? ?Anticipate d/c to home once housing plan established.  ? ? ?  ?Lajean Saver, MD ?04/23/21 1512 ? ?

## 2021-04-23 NOTE — ED Notes (Signed)
Report received from [place], Therapist, sports. Patient to bed 50.  ?

## 2021-04-23 NOTE — Progress Notes (Signed)
Inpatient Behavioral Health Placement ? ?Pt meets inpatient criteria per [name], NP. There are no available beds at [facility] per Switzer, RN. Referral was sent to the following facilities;  ? ?Destination ?Service Provider Address Phone Fax  ?[facility]  [address] [place] [address] [phone]  ?[facility] [address], [place] [address] [phone]  ?[facility]  [address] [place] [address] [phone]  ?[facility]  [address] [place] [address] [phone]  ?[place], [place] [address] [phone]  ?[facility]  [place], [place] [address] [phone]  ?[facility] [address] [place] [address] [phone]  ?[facility] [place]  [place], [place] [address] [phone]  ?[place] [place] [address] [phone]  ?[facility]  [address], [place] [address] [phone]  ?[facility]  [address] [place] [address] [phone]  ?[facility]  [address] [place] [address] [phone]  ?[place]  [place] [place] [address] [phone]  ?[facility]  [address], [place] [address] [phone]  ?[facility]  [place], [place] [address] [phone]  ?[facility]  [place], [place] [address] [phone]  ?[facility]  [address] [place] [address] [phone]  ? ? ? ? ?Situation ongoing,  CSW will follow up. ? ? ?Benjaman Kindler, MSW, LCSWA ?04/23/2021  @ 1:18 AM ? ?

## 2021-04-23 NOTE — Discharge Instructions (Signed)
If you are in need of a shelter please call [facility] ([facility]) at [phone] between the hours of 9am-5pm Mon-Fri.  [facility] will contact all the local shelters to find openings.  Right now they are requiring people to quarantine at a hotel before they can go to an open bed but [facility] may be able to set that up as well.  They are not open on weekends.  On Monday-Friday morning at 8 am until 1 pm, you can also go to the [facility] (see below) to seek shelter in the Hotel Quarantine Program prior to entering a shelter. You can also call the number provided (see the above paragraph) to seek placement into the program by calling [facility].   [facility] ([facility]) [address] [place], [place] [address] Phone: [phone] Fax: [phone]  For Free Breakfasts and Lunches 7 days a week you can go to: [facility] [address], [place], [place] [address] Hours: Open today  8AM-5PM Phone: [phone] Breakfast: 6:30-7:30 am Lunch served: 10:40 am - 12:40pm         [facility] ([facility]) M-F 8am-3pm   [address] [place], [place] [address]   [phone] Services include: laundry, barbering, support groups, case management, phone  & computer access, showers, AA/NA mtgs, mental health/substance abuse nurse, job skills class, disability information, VA assistance, spiritual classes, etc.   [facility] [address] [place], [place]   [phone] Provides breakfast each weekday morning except Wednesdays, and an evening community meal every Friday. Access to showers is available during breakfast hours and telephones for seeking work are also provided. Also offers job referral and counseling for the homeless and unemployed.  HOMELESS SHELTERS [facility]   [facility] [address]     [facility] [place], [place] [address]     [address], [place]  [phone]      [phone]  [facility]   [facility] [address]    [address] [place] [address]     [place], [place] [address] [phone]       [phone]  [facility] (women only) [address] [place], [place] [address] [phone]  [facility]: [place] (overflow shelter: [address] [place], [place] check in at 6pm for placement in local shelter).  [address], [place], [place] [address] Phone: [phone]  Crisis [facility]     [facility]   Crisis Services      [place] [facility] [phone]      [phone] [address]     [address] , [place] [address]     [place],  [address]    [facility] - [phone]  

## 2021-04-24 MED ORDER — CHLORHEXIDINE GLUCONATE CLOTH 2 % EX PADS
6.0000 | MEDICATED_PAD | Freq: Every day | CUTANEOUS | Status: DC
  Administered 2021-04-27 – 2021-04-30 (×5): 6 via TOPICAL

## 2021-04-24 NOTE — ED Notes (Addendum)
Dr. Ashok Cordia at bedside speaking to patient. Patient getting upset and yelling at the doctor.  ?

## 2021-04-24 NOTE — ED Notes (Signed)
Dinner at bedside

## 2021-04-24 NOTE — Discharge Planning (Signed)
Licensed Clinical Social Worker is seeking post-discharge placement for this patient at the following level of care: Inpatient [employer].  ? ?

## 2021-04-24 NOTE — [place] FL2 (Signed)
?[place] MEDICAID FL2 LEVEL OF CARE SCREENING TOOL  ?  ? ?IDENTIFICATION  ?Patient Name: ?[name] Birthdate: 06-01-1965 Sex: male Admission Date (Current Location): ?04/22/2021  ?[place] and [place] Number: ? [place] ?  Facility and Address:  ?[facility], [place] [address] [place], [place] [address] ?     Provider Number: ?[id number]  ?Attending Physician Name and Address:  ?Default, Provider, MD ? Relative Name and Phone Number:  ?  ?   ?Current Level of Care: ?Other (Comment) (emergency department) Recommended Level of Care: ?[place] Prior Approval Number: ?  ? ?Date Approved/Denied: ?  PASRR Number: ?[id number] A ? ?Discharge Plan: ?SNF ?  ? ?Current Diagnoses: ?Patient Active Problem List  ? Diagnosis Date Noted  ? Hypotension 03/07/2021  ? Shortness of breath 11/15/2020  ? Palliative care by specialist   ? Goals of care, counseling/discussion   ? 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 Platelet dysfunction (HCC)   ? Abscess of left thigh 01/28/2020  ? Cutaneous abscess of left upper limb 01/27/2020  ? Pain in right foot 01/20/2020  ? Spina bifida, unspecified ([place]) 01/20/2020  ? Acute gout of right knee 12/10/2019  ? Bacterial infection, unspecified 10/20/2019  ? Chest pressure   ? Coronary artery calcification   ? Chest pain   ? Sepsis ([place])   ? Vascular graft infection ([place])   ? Wound infection   ? Pyogenic arthritis of left knee joint ([place])   ? Pain and swelling of knee, left   ? Pain in left knee 10/03/2019  ? Complication of vascular access for dialysis   ? Hyperkalemia   ? Dysphagia   ? Unspecified open wound, left thigh, initial encounter 09/15/2019  ? Nausea 08/29/2019  ? Allergy, unspecified, initial encounter 08/27/2019  ? Anaphylactic shock, unspecified, initial encounter 08/27/2019  ? Diarrhea, unspecified 04/30/2019  ? Ureteral stone with hydronephrosis 03/26/2019  ? Malfunction of nephrostomy tube ([place])   ? Complication of nephrostomy  ([place]) 03/20/2019  ? Generalized abdominal pain 12/12/2018  ? 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 Abnormal CT scan, gastrointestinal tract   ? Bilateral flank pain 11/26/2018  ? Bilateral kidney stones 11/26/2018  ? Hydronephrosis with renal and ureteral calculus obstruction 11/26/2018  ? Left flank pain 11/26/2018  ? ESRD on dialysis ([place]) 06/12/2018  ? Mild protein-calorie malnutrition ([place]) 04/24/2018  ? Class 2 severe obesity due to excess calories with serious comorbidity and body mass index (BMI) of 37.0 to 37.9 in adult [facility]) 02/26/2018  ? PAD (peripheral artery disease) ([place]) 12/22/2017  ? Problem with dialysis access [facility]) 12/21/2017  ? Anemia 12/21/2017  ? Hypothyroidism 12/21/2017  ? GERD (gastroesophageal reflux disease) 12/21/2017  ? Anxiety 12/21/2017  ? Left great toe amputee ([place]) 12/19/2017  ? Methicillin susceptible Staphylococcus aureus infection as the cause of diseases classified elsewhere 12/19/2017  ? Endocarditis of mitral valve   ? 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 Bacteremia 12/08/2017  ? Fever   ? Right flank pain   ? ESRD (end stage renal disease) ([place]) 09/20/2017  ? ESRD (end stage renal disease) on dialysis ([place]) 08/07/2017  ? Severe aortic stenosis 02/26/2017  ? Nonhealing surgical wound 02/29/2016  ? Clotted dialysis access [facility]) 01/14/2016  ? Chills (without fever) 01/12/2016  ? Cutaneous abscess of back (any part, except buttock) 04/12/2015  ? Hypercalcemia 10/02/2014  ? Other specified respiratory disorders 09/17/2014  ? Other fatigue 06/15/2014  ?  Hyperglycemia, unspecified 02/16/2014  ? Chronic pain 04/28/2013  ? Epilepsy ([place]) 12/30/2012  ? Headache, unspecified 12/09/2012  ? Pruritus, unspecified 12/09/2012  ? Coagulation defect, unspecified ([place]) 12/05/2012  ? Removal of staples 07/19/2012  ? Complication of AV dialysis fistula 06/27/2012  ? End stage renal disease ([place]) 06/27/2012  ? Hypoglycemia, unspecified 10/25/2011  ? Personal  history of Methicillin resistant Staphylococcus aureus infection 11/17/2008  ? Allergy status to other drugs, medicaments and biological substances 10/02/2007  ? Anemia in chronic kidney disease 10/01/2007  ? Chronic kidney disease, unspecified 10/01/2007  ? Essential (primary) hypertension 10/01/2007  ? Iron deficiency anemia, unspecified 10/01/2007  ? Secondary hyperparathyroidism of renal origin ([place]) 10/01/2007  ? ? ?Orientation RESPIRATION BLADDER Height & Weight   ?  ?Self, Time, Place, Situation ? Normal Indwelling catheter Weight: 271 lb 2.7 oz (123 kg) ?Height:  5\' 11"  (180.3 cm)  ?BEHAVIORAL SYMPTOMS/MOOD NEUROLOGICAL BOWEL NUTRITION STATUS  ?    Incontinent    ?AMBULATORY STATUS COMMUNICATION OF NEEDS Skin   ?Limited Assist Verbally Normal ?  ?  ?  ?    ?     ?     ? ? ?Personal Care Assistance Level of Assistance  ?Bathing, Feeding, Dressing Bathing Assistance: Limited assistance ?Feeding assistance: Independent ?Dressing Assistance: Limited assistance ?   ? ?Functional Limitations Info  ?    ?  ?   ? ? ?SPECIAL CARE FACTORS FREQUENCY  ?PT (By licensed PT), OT (By licensed OT)   ?  ?PT Frequency: 5x weekly ?OT Frequency: wx weekly ?  ?  ?  ?   ? ? ?Contractures Contractures Info: Not present  ? ? ?Additional Factors Info  ?Code Status, Allergies Code Status Info: full ?Allergies Info: hydrocodone, furadantine, mandelamine, noroxin, sulpher, sulfa ?  ?  ?  ?   ? ?Current Medications (04/24/2021):  This is the current hospital active medication list ?Current Facility-Administered Medications  ?Medication Dose Route Frequency Provider Last Rate Last Admin  ? acetaminophen (TYLENOL) tablet 650 mg  650 mg Oral Q6H PRN Tedd Sias, PA      ? Chlorhexidine Gluconate Cloth 2 % PADS 6 each  6 each Topical Q0600 Dwana Melena, MD      ? Derrill Memo ON 04/25/2021] cinacalcet (SENSIPAR) tablet 30 mg  30 mg Oral Q M,W,F-HD Pati Gallo S, PA      ? clopidogrel (PLAVIX) tablet 75 mg  75 mg Oral Daily Liz [place],  RPH   75 mg at 04/24/21 0908  ? [START ON 04/25/2021] doxercalciferol (HECTOROL) injection 4 mcg  4 mcg Intravenous Q M,W,F-HD [place], Wylder S, PA      ? escitalopram (LEXAPRO) tablet 5 mg  5 mg Oral Daily Pati Gallo S, PA   5 mg at 04/24/21 1308  ? famotidine (PEPCID) tablet 40 mg  40 mg Oral Daily Pati Gallo S, [place]   40 mg at 04/24/21 6578  ? fluticasone (FLONASE) 50 MCG/ACT nasal spray 2 spray  2 spray Each Nare PRN Pati Gallo S, PA      ? furosemide (LASIX) tablet 80 mg  80 mg Oral Daily Pati Gallo S, PA   80 mg at 04/24/21 0908  ? levothyroxine (SYNTHROID) tablet 137 mcg  137 mcg Oral QAC breakfast Pati Gallo [place], [place]   137 mcg at 04/24/21 4696  ? lidocaine (LIDODERM) 5 % 1 patch  1 patch Transdermal Q24H Fredia Sorrow, MD   1 patch at 04/24/21 0905  ? melatonin tablet  10 mg  10 mg Oral QHS PRN Tedd Sias, PA   10 mg at 04/22/21 2204  ? ondansetron (ZOFRAN-ODT) disintegrating tablet 4 mg  4 mg Oral Q8H PRN Pati Gallo S, PA      ? oxyCODONE-acetaminophen (PERCOCET/ROXICET) 5-325 MG per tablet 1 tablet  1 tablet Oral Q6H PRN Tedd Sias, PA   1 tablet at 04/24/21 6468  ? polyethylene glycol (MIRALAX / GLYCOLAX) packet 17 g  17 g Oral Daily Pati Gallo S, PA   17 g at 04/23/21 1100  ? traZODone (DESYREL) tablet 100 mg  100 mg Oral QHS PRN Pati Gallo S, PA   100 mg at 04/22/21 2204  ? zolpidem (AMBIEN) tablet 10 mg  10 mg Oral QHS Pati Gallo S, [place]   10 mg at 04/23/21 2109  ? ?Current Outpatient Medications  ?Medication Sig Dispense Refill  ? acetaminophen (TYLENOL) 500 MG tablet Take 1,000 mg by mouth every 6 (six) hours as needed for mild pain or headache.    ? aspirin EC 325 MG tablet Take 325 mg by mouth daily.    ? calcium acetate (PHOSLO) 667 MG capsule Take 3-4 capsules (2,001-2,668 mg total) by mouth See admin instructions. Take 2708 mg by mouth 3 times daily with meals, take 2001 mg by mouth with snacks (Patient taking differently: Take 2,668 mg by mouth 3  (three) times daily with meals.) 450 capsule 0  ? cinacalcet (SENSIPAR) 30 MG tablet Take 1 tablet (30 mg total) by mouth every Monday, Wednesday, and Friday with hemodialysis. 60 tablet 0  ? clopidogrel (PL

## 2021-04-24 NOTE — Evaluation (Signed)
Physical Therapy Evaluation ?Patient Details ?Name: [name] ?MRN: [id number] ?DOB: 11-17-1965 ?Today's Date: 04/24/2021 ? ?History of Present Illness ? 56 y/o male presented to ED on 04/22/2021 with complaints of chest pain and suicidal ideation after recently being forced into homelessness. PMH: ESRD, seizure disorder, HTN, aortic stenosis, spina bifida, anemia, obesity, anxiety  ?Clinical Impression ? Pt presents to PT with deficits in strength, power, balance, gait, functional mobility, and endurance, largely limited by L knee pain. Pt mobilizes well in bed but has difficulty tolerating standing, unable to weight shift at this time in order to perform a pivot transfer or ambulate. Pt expresses concerns including but not limited to wanting a palliative care and chaplain consult as well as wanting improved pain management. Pt also states multiple times during session that he has considered pulling out his femoral dialysis access sit in an effort to bleed out and die. PT made MD aware. Pt provides education on the need for gradual L knee ROM in an effort to improve tolerance for activity. Acute PT will continue to follow in an effort to improve activity tolerance and progress to more out of bed mobility. If pt is to return to the community and homelessness he will need to be at a modI level for transfers, including floor transfers. PT recommends SNF placement at this time as the pt remains unable to perform these tasks safely. ?   ? ?Recommendations for follow up therapy are one component of a multi-disciplinary discharge planning process, led by the attending physician.  Recommendations may be updated based on patient status, additional functional criteria and insurance authorization. ? ?Follow Up Recommendations Skilled nursing-short term rehab (<3 hours/day) ? ?  ?Assistance Recommended at Discharge Intermittent Supervision/Assistance  ?Patient can return home with the following ? Two people to help with  walking and/or transfers;A lot of help with bathing/dressing/bathroom;Assistance with cooking/housework;Assist for transportation;Help with stairs or ramp for entrance ? ?  ?Equipment Recommendations None recommended by PT (pt owns necessary DME)  ?Recommendations for Other Services ?    ?  ?Functional Status Assessment Patient has had a recent decline in their functional status and demonstrates the ability to make significant improvements in function in a reasonable and predictable amount of time.  ? ?  ?Precautions / Restrictions Precautions ?Precautions: Fall ?Precaution Comments: L knee pain ?Restrictions ?Weight Bearing Restrictions: No  ? ?  ? ?Mobility ? Bed Mobility ?Overal bed mobility: Modified Independent ?  ?  ?  ?  ?  ?  ?General bed mobility comments: sidelying to sitting ?  ? ?Transfers ?Overall transfer level: Needs assistance ?Equipment used: Rolling walker (2 wheels) ?Transfers: Sit to/from Stand ?Sit to Stand: Min guard ?  ?  ?  ?  ?  ?General transfer comment: pt unable to shift weight in standing or stand for periods of >10 seconds due to reports of L knee pain. Pt attempts standing twice. ?  ? ?Ambulation/Gait ?  ?  ?  ?  ?  ?  ?Pre-gait activities: unable to attempt pre-gait weight shfiting due to poor standing tolerance ?  ? ?Stairs ?  ?  ?  ?  ?  ? ?Wheelchair Mobility ?  ? ?Modified Rankin (Stroke Patients Only) ?  ? ?  ? ?Balance Overall balance assessment: Needs assistance ?Sitting-balance support: No upper extremity supported, Feet supported ?Sitting balance-Leahy Scale: Fair ?  ?  ?Standing balance support: Bilateral upper extremity supported, Reliant on assistive device for balance ?Standing balance-Leahy Scale: Poor ?Standing balance  comment: minG with BUE support of RW for brief periods at edge of bed ?  ?  ?  ?  ?  ?  ?  ?  ?  ?  ?  ?   ? ? ? ?Pertinent Vitals/Pain Pain Assessment ?Pain Assessment: Faces ?Faces Pain Scale: Hurts whole lot ?Pain Location: head and L knee ?Pain  Descriptors / Indicators: Throbbing ?Pain Intervention(s): Monitored during session  ? ? ?Home Living Family/patient expects to be discharged to:: Shelter/Homeless ?  ?  ?  ?  ?  ?  ?  ?  ?  ?Additional Comments: recently homeless  ?  ?Prior Function Prior Level of Function : Independent/Modified Independent ?  ?  ?  ?  ?  ?  ?Mobility Comments: pt reports he is able to ambulate for short household distances with use of a bariatric walker. He had recently been utilizing his manual wheelchairs for longer distances. Did fall last week, reports he failed to lock his wheelchair. ?  ?  ? ? ?Hand Dominance  ? Dominant Hand: Right ? ?  ?Extremity/Trunk Assessment  ? Upper Extremity Assessment ?Upper Extremity Assessment: Overall WFL for tasks assessed ?  ? ?Lower Extremity Assessment ?Lower Extremity Assessment: LLE deficits/detail ?LLE Deficits / Details: AROM WFL, pt reports significant pain with knee extension from a flexed position. Formal strength assessment deferred due to pain but at least 4-/5 based on observation ?  ? ?Cervical / Trunk Assessment ?Cervical / Trunk Assessment: Other exceptions ?Cervical / Trunk Exceptions: excess body habitus, ostomy, bilateral nephrostomy tube site dressings  ?Communication  ? Communication: No difficulties  ?Cognition Arousal/Alertness: Awake/alert ?Behavior During Therapy: Anxious ?Overall Cognitive Status: No family/caregiver present to determine baseline cognitive functioning ?  ?  ?  ?  ?  ?  ?  ?  ?  ?  ?  ?  ?  ?  ?  ?  ?General Comments: pt may be at baseline, he is very anxious and upset about his medical needs and new homelessness. Pt feels he is not being heard and his concerns are not being addressed. ?  ?  ? ?  ?General Comments General comments (skin integrity, edema, etc.): pt reports light sensitivity causing a throbbing headache ? ?  ?Exercises    ? ?Assessment/Plan  ?  ?PT Assessment Patient needs continued PT services  ?PT Problem List Decreased  strength;Decreased activity tolerance;Decreased balance;Decreased mobility;Decreased safety awareness;Decreased knowledge of use of DME;Pain ? ?   ?  ?PT Treatment Interventions DME instruction;Functional mobility training;Gait training;Therapeutic activities;Therapeutic exercise;Balance training;Neuromuscular re-education;Cognitive remediation;Patient/family education;Wheelchair mobility training   ? ?PT Goals (Current goals can be found in the Care Plan section)  ?Acute Rehab PT Goals ?Patient Stated Goal: to reduce pain and improve mobility ?PT Goal Formulation: With patient ?Time For Goal Achievement: 05/08/21 ?Potential to Achieve Goals: Fair ?Additional Goals ?Additional Goal #1: Pt will independently utilize a manual wheelchair to mobilize for >1000' to demonstrate the ability to move in a community setting ?Additional Goal #2: Pt will demonstrate the ability to perform a floor to wheelchair and wheelchair to floor transfer at a modI level ? ?  ?Frequency Min 2X/week ?  ? ? ?Co-evaluation   ?  ?  ?  ?  ? ? ?  ?AM-PAC PT "6 Clicks" Mobility  ?Outcome Measure Help needed turning from your back to your side while in a flat bed without using bedrails?: None ?Help needed moving from lying on your back to sitting on the  side of a flat bed without using bedrails?: None ?Help needed moving to and from a bed to a chair (including a wheelchair)?: Total ?Help needed standing up from a chair using your arms (e.g., wheelchair or bedside chair)?: A Little ?Help needed to walk in hospital room?: Total ?Help needed climbing 3-5 steps with a railing? : Total ?6 Click Score: 14 ? ?  ?End of Session   ?Activity Tolerance: Patient limited by pain ?Patient left: in bed;with call bell/phone within reach ?Nurse Communication: Mobility status ?PT Visit Diagnosis: Muscle weakness (generalized) (M62.81);Pain;Difficulty in walking, not elsewhere classified (R26.2) ?Pain - Right/Left: Left ?Pain - part of body: Knee ?  ? ?Time:  4696-2952 ?PT Time Calculation (min) (ACUTE ONLY): 34 min ? ? ?Charges:   PT Evaluation ?$PT Eval Low Complexity: 1 Low ?  ?  ?   ? ? ?Zenaida Niece, PT, DPT ?Acute Rehabilitation ?Pager: [phone] ?Office [phone] ? ? ?Lady Wisham J L

## 2021-04-24 NOTE — TOC Progression Note (Addendum)
Transition of Care (TOC) - Progression Note  ? ? ?Patient Details  ?Name: [name] ?MRN: [id number] ?Date of Birth: 1965-03-03 ? ?Transition of Care (TOC) CM/SW Contact  ?Alfredia Ferguson, LCSW ?Phone Number: ?04/24/2021, 12:17 PM ? ?Clinical Narrative:    ?CSW informed patient has been psych cleared. CSW provided patient with information and resources for homeless supports and outpatient behavioral health yesterday and confirmed they are in patient's AVS. ? ?12:35p: CSW contacted by RN to see patient. CSW met with patient and noted he presented several concerns about his physical health. With his reported fall and increase in pain, recent hospitalization PT has been requested to eval. CSW notes with traditional medicare patient is outside of thirty days and may be difficult to place if needed.  ?  ?  ? ?Expected Discharge Plan and Services ?  ?  ?  ?  ?  ?                ?  ?  ?  ?  ?  ?  ?  ?  ?  ?  ? ? ?Social Determinants of Health (SDOH) Interventions ?  ? ?Readmission Risk Interventions ? ?  03/11/2021  ?  2:57 PM  ?Readmission Risk Prevention Plan  ?Transportation Screening Complete  ?PCP or Specialist Appt within 3-5 Days Complete  ?[place] or Home Care Consult Complete  ?Social Work Consult for [place] Planning/Counseling Complete  ?Palliative Care Screening Not Applicable  ?Medication Review Press photographer) Complete  ? ? ?

## 2021-04-24 NOTE — ED Notes (Signed)
Patient has been cleared by psychiatry. He is now a boarder waiting on placement. ?

## 2021-04-24 NOTE — Progress Notes (Signed)
?[facility] ?Progress Note  ? ? ?56 y.o. male with ESRD on HD MWF at [facility]. Seizure disorder, migraine HA, hyperkalemia, GERD, HTN, recurrent kidney stones, aortic stenosis, spina bifida, anemia, obesity, and anxiety.  Patient presenting with chest pain and suicidal ideation, stressed because his cousin moved out and landlord is raising rent.  Overall body aches and being warm but denies nausea, vomiting. ? ?OP dialysis Orders:  ?MWF - [facility] ([place]) ?4hrs49min, BFR 350, DFR 800,  EDW 118-118.5 (121 kg needs to be updated), 2K/ 2Ca ?Heparin 3700 units IV ?Mircera 50 last given 4/12 ?Hectorol 45mcg IV qHD ?Sensipar 90mg   ?Phoslo 667mg  4 capsules with meals and 3 with snacks ? ?Assessment/ Plan:   ?Suicidal ideation: cousin moved out recently and landlord raising rent. Patient will be essentially homeless. Now cleared by psych and since finding out he's now stating he will open the clamp on the catheter. ?ESRD - on HD MWF. HD on schedule; treated 3x last week leaving lower than listed EDW of 121kg) (History of recurrent hyperkalemia on p.o. Kayexalate at home). TDC MUST be packed with cath flo q treatment.  No absolute indication for RRT and the patient appears to be  comfortable. Next HD on Monday if he's still here. ?  ?Chronic R flank discomfort 2/2 to chronic nephrolithiasis. Pain management per primary.  ?Hypertension/volume  -BP on the low side 96-122/46-65 on midodrine.  ?Anemia of CKD -Hgb 12.6 Mircera 50 (last given 4/12) ?Secondary Hyperparathyroidism - Recheck phos to adjust binders. Continue Hectorol and sensipar.  ?Nutrition - Renal diet with fluid restriction. Needs protein supplements ?Severe AS-followed by Dr. Angelena Form. ? ?Subjective:   ?Complaining of pain all over especially right side after a fall. Denies f/c/n/v. Initially stated he wanted to stop dialysis but then on further questioning says he wants it.  ? ?Objective:   ?BP (!) 82/57 (BP  Location: Left Arm)   Pulse (!) 49   Temp 98.5 ?F (36.9 ?C) (Oral)   Resp 16   Ht 5\' 11"  (1.803 m)   Wt 123 kg   SpO2 98%   BMI 37.82 kg/m?  ?No intake or output data in the 24 hours ending 04/24/21 0934 ?Weight change:  ? ?Physical Exam: ?General: Alert, chronically ill-appearing male NAD, obese ?Heart: RRR, 2/6 SEM no gallop or rub ?Lungs: CTA nonlabored breathing 2 L [place] ?Abdomen: Obese, NABS, ileostomy in place, tenderness decreasing yesterday no distention ?Extremities: Trace bipedal edema  ?Dialysis Access: Right thigh TDC dressing dry intact ? ?Imaging: ?DG Chest 2 View ? ?Result Date: 04/22/2021 ?CLINICAL DATA:  CP EXAM: CHEST - 2 VIEW COMPARISON:  Chest x-ray 03/07/2021 FINDINGS: Query small pericardial effusion on lateral view. Otherwise the heart and mediastinal contours are unchanged. Low lung volumes. Bibasilar atelectasis. No focal consolidation. No pulmonary edema. No pleural effusion. No pneumothorax. No acute osseous abnormality. IMPRESSION: 1. Query small pericardial effusion on lateral view. 2. Low lung volumes. Electronically Signed   By: Iven Finn M.D.   On: 04/22/2021 19:58   ? ?Labs: ?BMET ?Recent Labs  ?Lab 04/22/21 ?1437  ?NA 138  ?K 3.6  ?CL 97*  ?CO2 26  ?GLUCOSE 78  ?BUN 8  ?CREATININE 4.68*  ?CALCIUM 8.6*  ? ?CBC ?Recent Labs  ?Lab 04/22/21 ?1437  ?WBC 5.3  ?NEUTROABS 4.2  ?HGB 12.6*  ?HCT 40.5  ?MCV 99.0  ?PLT PLATELET CLUMPS NOTED ON SMEAR, UNABLE TO ESTIMATE  ? ? ?Medications:   ? ? [START ON  04/25/2021] cinacalcet  30 mg Oral Q M,W,F-HD  ? clopidogrel  75 mg Oral Daily  ? [START ON 04/25/2021] doxercalciferol  4 mcg Intravenous Q M,W,F-HD  ? escitalopram  5 mg Oral Daily  ? famotidine  40 mg Oral Daily  ? furosemide  80 mg Oral Daily  ? levothyroxine  137 mcg Oral QAC breakfast  ? lidocaine  1 patch Transdermal Q24H  ? polyethylene glycol  17 g Oral Daily  ? zolpidem  10 mg Oral QHS  ? ? ? ? ?Otelia Santee, MD ?04/24/2021, 9:34 AM  ? ?

## 2021-04-24 NOTE — ED Notes (Signed)
Patient upset stating his medical needs have not been attended to. States he was not evaluated for a fall he had prior to coming to ED. Nephrology at bedside speaking with patient. Patient upset that he is psych cleared and crying "why did I even come here? No one wants to do anything for me. I want to bleed out of my dialysis port."  ?

## 2021-04-24 NOTE — ED Notes (Signed)
PT at bedside accessing patient ?

## 2021-04-24 NOTE — ED Notes (Addendum)
error 

## 2021-04-24 NOTE — ED Provider Notes (Signed)
Emergency Medicine Observation Re-evaluation Note ? ?[name] is a 56 y.o. male, seen on rounds today.  Pt initially presented to the ED for complaints of new housing instability and resultant mood disorder, anxiety, and transient SI.  Pt has indicated he has no plan to harm or kill self, but that due to his housing issue he is feeling distraught.  ? ?Physical Exam  ?BP (!) 82/57 (BP Location: Left Arm)   Pulse (!) 49   Temp 98.5 ?F (36.9 ?C) (Oral)   Resp 16   Ht 1.803 m (5\' 11" )   Wt 123 kg   SpO2 98%   BMI 37.82 kg/m?  ?Physical Exam ?General: alert, content.  ?Cardiac: regular rate.  ?Lungs: breathing comfortably ?Psych: resting comfortably, no distress. Pt has exhibit normal appetite, no trouble sleeping. Pt does not appear acutely despondent or depressed. He is not responding to internal stimuli.  ? ?ED Course / MDM  ? ? ?I have reviewed the labs performed to date as well as medications administered while in observation.  Recent changes in the last 24 hours include ED obs, reassessment.  ? ?Plan  ? ? [name] is not under involuntary commitment. ? ?Last bp low, no new symptoms, will recheck.  ? ?Pt is psych clear, although would benefit from outpatient counseling and support services.  ? ?TOC has been consulted to assist with placement - no clear from notes where they are in that process - will attempt to re-engage TOC to come up with definitive plan for patient. As positive, pt does reports several payer sources that should help effort to obtain housing stability.  ? ? ?  ?Lajean Saver, MD ?04/24/21 1138 ? ?

## 2021-04-25 ENCOUNTER — Encounter (HOSPITAL_COMMUNITY): Payer: Self-pay | Admitting: Nephrology

## 2021-04-25 ENCOUNTER — Inpatient Hospital Stay (HOSPITAL_COMMUNITY): Payer: Medicare Other

## 2021-04-25 DIAGNOSIS — G894 Chronic pain syndrome: Secondary | ICD-10-CM | POA: Diagnosis not present

## 2021-04-25 DIAGNOSIS — I208 Other forms of angina pectoris: Secondary | ICD-10-CM | POA: Diagnosis not present

## 2021-04-25 DIAGNOSIS — E039 Hypothyroidism, unspecified: Secondary | ICD-10-CM | POA: Diagnosis present

## 2021-04-25 DIAGNOSIS — N189 Chronic kidney disease, unspecified: Secondary | ICD-10-CM | POA: Diagnosis not present

## 2021-04-25 DIAGNOSIS — I352 Nonrheumatic aortic (valve) stenosis with insufficiency: Secondary | ICD-10-CM | POA: Diagnosis present

## 2021-04-25 DIAGNOSIS — N2581 Secondary hyperparathyroidism of renal origin: Secondary | ICD-10-CM | POA: Diagnosis present

## 2021-04-25 DIAGNOSIS — Z89412 Acquired absence of left great toe: Secondary | ICD-10-CM | POA: Diagnosis not present

## 2021-04-25 DIAGNOSIS — F22 Delusional disorders: Secondary | ICD-10-CM | POA: Diagnosis present

## 2021-04-25 DIAGNOSIS — Z20822 Contact with and (suspected) exposure to covid-19: Secondary | ICD-10-CM | POA: Diagnosis present

## 2021-04-25 DIAGNOSIS — R45851 Suicidal ideations: Secondary | ICD-10-CM | POA: Diagnosis present

## 2021-04-25 DIAGNOSIS — Z992 Dependence on renal dialysis: Secondary | ICD-10-CM | POA: Diagnosis not present

## 2021-04-25 DIAGNOSIS — I120 Hypertensive chronic kidney disease with stage 5 chronic kidney disease or end stage renal disease: Secondary | ICD-10-CM | POA: Diagnosis present

## 2021-04-25 DIAGNOSIS — Z515 Encounter for palliative care: Secondary | ICD-10-CM | POA: Diagnosis not present

## 2021-04-25 DIAGNOSIS — N319 Neuromuscular dysfunction of bladder, unspecified: Secondary | ICD-10-CM | POA: Diagnosis present

## 2021-04-25 DIAGNOSIS — G8929 Other chronic pain: Secondary | ICD-10-CM | POA: Diagnosis present

## 2021-04-25 DIAGNOSIS — Z87440 Personal history of urinary (tract) infections: Secondary | ICD-10-CM | POA: Diagnosis not present

## 2021-04-25 DIAGNOSIS — I351 Nonrheumatic aortic (valve) insufficiency: Secondary | ICD-10-CM | POA: Diagnosis not present

## 2021-04-25 DIAGNOSIS — I25110 Atherosclerotic heart disease of native coronary artery with unstable angina pectoris: Secondary | ICD-10-CM | POA: Diagnosis present

## 2021-04-25 DIAGNOSIS — I739 Peripheral vascular disease, unspecified: Secondary | ICD-10-CM | POA: Diagnosis present

## 2021-04-25 DIAGNOSIS — Z66 Do not resuscitate: Secondary | ICD-10-CM | POA: Diagnosis not present

## 2021-04-25 DIAGNOSIS — R079 Chest pain, unspecified: Secondary | ICD-10-CM | POA: Diagnosis not present

## 2021-04-25 DIAGNOSIS — I350 Nonrheumatic aortic (valve) stenosis: Secondary | ICD-10-CM

## 2021-04-25 DIAGNOSIS — I2510 Atherosclerotic heart disease of native coronary artery without angina pectoris: Secondary | ICD-10-CM | POA: Diagnosis present

## 2021-04-25 DIAGNOSIS — I959 Hypotension, unspecified: Secondary | ICD-10-CM | POA: Diagnosis present

## 2021-04-25 DIAGNOSIS — D631 Anemia in chronic kidney disease: Secondary | ICD-10-CM | POA: Diagnosis present

## 2021-04-25 DIAGNOSIS — L02416 Cutaneous abscess of left lower limb: Secondary | ICD-10-CM | POA: Diagnosis not present

## 2021-04-25 DIAGNOSIS — Z6837 Body mass index (BMI) 37.0-37.9, adult: Secondary | ICD-10-CM | POA: Diagnosis not present

## 2021-04-25 DIAGNOSIS — E441 Mild protein-calorie malnutrition: Secondary | ICD-10-CM | POA: Diagnosis present

## 2021-04-25 DIAGNOSIS — Z7189 Other specified counseling: Secondary | ICD-10-CM | POA: Diagnosis not present

## 2021-04-25 DIAGNOSIS — F329 Major depressive disorder, single episode, unspecified: Secondary | ICD-10-CM | POA: Diagnosis present

## 2021-04-25 DIAGNOSIS — N186 End stage renal disease: Secondary | ICD-10-CM | POA: Diagnosis present

## 2021-04-25 DIAGNOSIS — R9389 Abnormal findings on diagnostic imaging of other specified body structures: Secondary | ICD-10-CM | POA: Diagnosis not present

## 2021-04-25 MED ORDER — HEPARIN SODIUM (PORCINE) 1000 UNIT/ML DIALYSIS: 4000.0000 [IU] | Freq: Once | INTRAMUSCULAR | Status: DC

## 2021-04-25 MED ORDER — TRAZODONE HCL 100 MG PO TABS: 100.0000 mg | ORAL_TABLET | Freq: Every day | ORAL | Status: DC

## 2021-04-25 MED ORDER — SODIUM CHLORIDE 0.9 % IV SOLN: 100.0000 mL | INTRAVENOUS | Status: DC | PRN

## 2021-04-25 MED ORDER — LIDOCAINE HCL (PF) 1 % IJ SOLN: 5.0000 mL | INTRAMUSCULAR | Status: DC | PRN

## 2021-04-25 MED ORDER — HEPARIN SODIUM (PORCINE) 1000 UNIT/ML DIALYSIS: 1000.0000 [IU] | INTRAMUSCULAR | Status: DC | PRN

## 2021-04-25 MED ORDER — ACETAMINOPHEN 325 MG PO TABS
325.0000 mg | ORAL_TABLET | Freq: Four times a day (QID) | ORAL | Status: DC
Start: 2021-04-25 — End: 2021-05-02
  Administered 2021-04-25 – 2021-05-01 (×14): 325 mg via ORAL
  Filled 2021-04-25 (×18): qty 1

## 2021-04-25 MED ORDER — SODIUM POLYSTYRENE SULFONATE PO POWD: 15.0000 g | ORAL | Status: DC

## 2021-04-25 MED ORDER — ASPIRIN EC 325 MG PO TBEC
325.0000 mg | DELAYED_RELEASE_TABLET | Freq: Every day | ORAL | Status: DC
  Administered 2021-04-25 – 2021-04-27 (×3): 325 mg via ORAL
  Filled 2021-04-25 (×3): qty 1

## 2021-04-25 MED ORDER — ALTEPLASE 2 MG IJ SOLR
2.0000 mg | Freq: Once | INTRAMUSCULAR | Status: DC | PRN
  Filled 2021-04-25: qty 2

## 2021-04-25 MED ORDER — CALCIUM ACETATE (PHOS BINDER) 667 MG PO CAPS
2001.0000 mg | ORAL_CAPSULE | Freq: Two times a day (BID) | ORAL | Status: DC | PRN
  Filled 2021-04-25 (×2): qty 3

## 2021-04-25 MED ORDER — ALTEPLASE 2 MG IJ SOLR
INTRAMUSCULAR | Status: AC
  Filled 2021-04-25: qty 2

## 2021-04-25 MED ORDER — PROMETHAZINE HCL 25 MG PO TABS
25.0000 mg | ORAL_TABLET | Freq: Once | ORAL | Status: AC
Start: 2021-04-25 — End: 2021-04-25

## 2021-04-25 MED ORDER — OXYCODONE HCL 5 MG PO TABS
10.0000 mg | ORAL_TABLET | Freq: Four times a day (QID) | ORAL | Status: DC
  Administered 2021-04-25 – 2021-04-27 (×5): 10 mg via ORAL
  Filled 2021-04-25 (×5): qty 2

## 2021-04-25 MED ORDER — ROSUVASTATIN CALCIUM 5 MG PO TABS
10.0000 mg | ORAL_TABLET | Freq: Every day | ORAL | Status: DC
  Administered 2021-04-25 – 2021-05-01 (×7): 10 mg via ORAL
  Filled 2021-04-25 (×8): qty 2

## 2021-04-25 MED ORDER — LIDOCAINE-PRILOCAINE 2.5-2.5 % EX CREA
1.0000 "application " | TOPICAL_CREAM | CUTANEOUS | Status: DC | PRN
  Filled 2021-04-25: qty 5

## 2021-04-25 MED ORDER — HEPARIN SODIUM (PORCINE) 5000 UNIT/ML IJ SOLN
5000.0000 [IU] | Freq: Three times a day (TID) | INTRAMUSCULAR | Status: DC
  Administered 2021-04-25 – 2021-05-02 (×19): 5000 [IU] via SUBCUTANEOUS
  Filled 2021-04-25 (×19): qty 1

## 2021-04-25 MED ORDER — CALCIUM ACETATE 667 MG PO CAPS: 2001.0000 mg | ORAL_CAPSULE | ORAL | Status: DC

## 2021-04-25 MED ORDER — PROMETHAZINE HCL 25 MG PO TABS
ORAL_TABLET | ORAL | Status: AC
  Administered 2021-04-25: 25 mg via ORAL
  Filled 2021-04-25: qty 1

## 2021-04-25 MED ORDER — LORAZEPAM 1 MG PO TABS
0.5000 mg | ORAL_TABLET | Freq: Two times a day (BID) | ORAL | Status: DC | PRN
  Administered 2021-04-26: 0.5 mg via ORAL
  Filled 2021-04-25: qty 1

## 2021-04-25 MED ORDER — PROMETHAZINE HCL 25 MG PO TABS
25.0000 mg | ORAL_TABLET | Freq: Two times a day (BID) | ORAL | Status: DC | PRN
  Administered 2021-04-29: 25 mg via ORAL
  Filled 2021-04-25 (×2): qty 1

## 2021-04-25 MED ORDER — PENTAFLUOROPROP-TETRAFLUOROETH EX AERO
1.0000 "application " | INHALATION_SPRAY | CUTANEOUS | Status: DC | PRN
  Filled 2021-04-25: qty 116

## 2021-04-25 MED ORDER — CALCIUM ACETATE (PHOS BINDER) 667 MG PO CAPS
2668.0000 mg | ORAL_CAPSULE | Freq: Three times a day (TID) | ORAL | Status: DC
  Administered 2021-04-26 – 2021-05-02 (×16): 2668 mg via ORAL
  Filled 2021-04-25 (×17): qty 4

## 2021-04-25 NOTE — Procedures (Signed)
Pt arrived to the unit with suicide ideation no sitter at bedside. BP upon arrival was 177/53, all vital signs were stable throughout his treatment.  Pt has a permcath in his right groin that had a non-occlusive dressing.  His venous and arterial ports are both sluggish with blood draw, however flush easily.  Dressing was changed using sterile technique as per protocol.  Education given on the importance of keeping dressings occlusive and clean. Both arterial and venous ports instilled with cathflow.  UF removed was 852mls and BP and the end of treatment was 106/60. ?

## 2021-04-25 NOTE — ED Notes (Signed)
Pt going to [facility] in the AM via [place], pt given tray  ?

## 2021-04-25 NOTE — ED Provider Notes (Signed)
5:15 PM-patient has been seen by cardiology, Dr. Burt Knack, who feels like the patient has significant AAS symptoms, which requires consideration for management including TAVR.  For the time being the patient will require cardiac echo since he has not had recent evaluation by the cardiology service.  He has multiple additional medical problems and comorbidities, and a palliative consult is pending.  This palliative consult and cardiology consult, should be done in an observed setting, of hospital.  Therefore, the patient will be admitted.  Cardiology will continue to follow. ? ?This case is complex, with multiple ongoing medical issues including nephrostomies, hemodialysis, decompensated medical status and psychiatric issues including suicidal ideation.  He was last dialyzed this morning using his right groin catheter.  He has been cleared by psychiatry.  At this time the patient is complaining of pain in his left knee, after a fall recently which preceded his admission to the ED.  He also has complained of headache.  He states he is not getting his usual prescribed medicine including oxycodone and lorazepam.  PDMP data reviewed indicating he uses oxycodone 10 mg/325, #120 monthly and lorazepam 1 mg twice daily #60 monthly.  Will restart these medicines. ? ?5:37 PM-Consult complete with hospitalist. Patient case explained and discussed.  He agrees to admit patient for further evaluation and treatment. Call ended at 5:50 PM ?  ?Daleen Bo, MD ?04/25/21 1757 ? ?

## 2021-04-25 NOTE — ED Notes (Signed)
Patient transported to CT 

## 2021-04-25 NOTE — H&P (Signed)
?History and Physical  ? ? ?[name] BOF:[id number] DOB: 1965-07-22 DOA: 04/22/2021 ? ?PCP: [name], MD (Confirm with patient/family/NH records and if not entered, this has to be entered at [facility] point of entry) ?Patient coming from: Home ? ?I have personally briefly reviewed patient's old medical records in [place] ? ?Chief Complaint: Chest pain, passing out. ? ?HPI: [name] is a 56 y.o. male with medical history significant of severe aortic stenosis, ESRD on HD, recurrent kidney stone, spinal bifida, neurogenic bladder status post nephrostomy, recurrent UTI, CAD, hypothyroidism, chronic pain and narcotic dependence, presented with anginal-like chest pain and near syncope's. ? ?Patient reportedly recently she underwent a lot of stress in his life, and she started to have episode of chest pains, pressure-like, associated with stress or exertion, each episode lasts 10 to 15 minutes, alleviated by by rest.  Associated with shortness of breath and palpitations occasionally.  He also has had 2 episodes of feeling lightheadedness including 1 episode 2 days ago he fell down and hit his head, denied any loss of consciousness.  He has been having dull like headache for last few days, denies any nauseous vomiting or blurry vision, no numbness weakness of the limbs. ? ?He felt frustrated and yesterday came to ED claiming that he planned to " really hurt myself", she was dialyzed and put on about rest.  Today he felt much better and denied any suicidal ideation or plan. ? ? ?Review of Systems: As per HPI otherwise 14 point review of systems negative.  ? ? ?Past Medical History:  ?Diagnosis Date  ? Anemia   ? Anxiety   ? Clotted dialysis access [facility]) 01/06/2020  ? Constipation   ? End stage renal failure on dialysis [facility])   ? [employer]; MWF, on HD since 1997, exhausted upper ext access, and possibly LE access; cath dependent in R groin as of 06/2018  ? GERD (gastroesophageal reflux disease)   ?  Grand mal seizure [facility]) X 4  ? last 1998 (02/29/2016)  ? History of blood transfusion 2000s  ? "I was having blood loss; never found out from where"  ? History of kidney stones   ? Hypertension   ? hx of - has not taken bp meds in over 2 years  since dialysis  ? Hypothyroidism   ? Insomnia   ? Migraine   ? "due to my BP in my late 1990s; none since" (02/29/2016)  ? Nonhealing surgical wound   ? of the left arteriovenous graft  ? Severe aortic stenosis   ? no problems with per pt. - nephrologist and Dr. Coletta Memos  ? Spina bifida ([place])   ? ? ?Past Surgical History:  ?Procedure Laterality Date  ? A/V FISTULAGRAM Right 02/03/2020  ? Procedure: A/V Fistulagram;  Surgeon: Serafina Mitchell, MD;  Location: [place] [facility];  Service: Cardiovascular;  Laterality: Right;  ? AMPUTATION Left 12/11/2017  ? Procedure: Left Great Toe Amputation;  Surgeon: [name], MD;  Location: [place];  Service: Orthopedics;  Laterality: Left;  ? APPENDECTOMY    ? APPLICATION OF WOUND VAC Left 10/04/2019  ? Procedure: APPLICATION OF WOUND VAC;  Surgeon: Serafina Mitchell, MD;  Location: [place];  Service: Vascular;  Laterality: Left;  ? ARTERIOVENOUS GRAFT PLACEMENT Left 10/16/2012  ? left femoral goretex graft         Dr Donnetta Hutching  ? AV FISTULA PLACEMENT Left 06/27/2012  ? Procedure: EXPLORATORY LEFT THY-GRAFT PSEUDO-ANEURYSM;  Surgeon: Jannette Fogo  Bridgett Larsson, MD;  Location: [facility] OR;  Service: Vascular;  Laterality: Left;  Revision of left Arteriovenus gortex graft in thigh.  ? [place] REMOVAL Left 10/04/2019  ? Procedure: REMOVAL OF ARTERIOVENOUS GORETEX GRAFT ([place]);  Surgeon: Serafina Mitchell, MD;  Location: [facility] OR;  Service: Vascular;  Laterality: Left;  ? COLONOSCOPY    ? DRESSING CHANGE UNDER ANESTHESIA Left 10/07/2019  ? Procedure: DRESSING CHANGE UNDER ANESTHESIA;  Surgeon: Meredith Pel, MD;  Location: [place];  Service: Orthopedics;  Laterality: Left;  ? EXCHANGE OF A DIALYSIS CATHETER Right 02/06/2020  ? Procedure: EXCHANGE OF TUNNELED DIALYSIS CATHETER;   Surgeon: Serafina Mitchell, MD;  Location: [place];  Service: Vascular;  Laterality: Right;  ? FLEXIBLE SIGMOIDOSCOPY N/A 12/03/2018  ? Procedure: FLEXIBLE SIGMOIDOSCOPY;  Surgeon: Yetta Flock, MD;  Location: [place];  Service: Gastroenterology;  Laterality: N/A;  ? I & D EXTREMITY Left 02/29/2016  ? Procedure: DEBRIDEMENT LEFT THIGH WOUND;  Surgeon: Waynetta Sandy, MD;  Location: [place];  Service: Vascular;  Laterality: Left;  ? I & D EXTREMITY Left 10/04/2019  ? Procedure: IRRIGATION AND DEBRIDEMENT LEFT THIGH;  Surgeon: Serafina Mitchell, MD;  [facility];  Service: Vascular;  Laterality: Left;  ? I & D EXTREMITY Right 12/09/2019  ? Procedure: IRRIGATION AND DEBRIDEMENT EXTREMITY;  Surgeon: Altamese [place], MD;  Location: [place];  Service: Orthopedics;  Laterality: Right;  ? ILEOSTOMY  1970s  ? INCISION AND DRAINAGE Left 10/16/2012  ? Procedure: INCISION AND Debridement left thigh graft;  Surgeon: Rosetta Posner, MD;  Location: [facility] OR;  Service: Vascular;  Laterality: Left;  ? INSERTION OF DIALYSIS CATHETER    ? INSERTION OF DIALYSIS CATHETER  01/14/2016  ? Procedure: INSERTION OF DIALYSIS CATHETER;  Surgeon: Waynetta Sandy, MD;  Location: [place];  Service: Vascular;;  ? INSERTION OF DIALYSIS CATHETER Right 08/07/2017  ? Procedure: INSERTION OF DIALYSIS CATHETER;  Surgeon: Waynetta Sandy, MD;  Location: [place];  Service: Vascular;  Laterality: Right;  ? INSERTION OF DIALYSIS CATHETER Right 12/21/2017  ? Procedure: RIGHT FEMORAL DIALYSIS CATHETER EXCHANGE;  Surgeon: Marty Heck, MD;  Location: [place];  Service: Vascular;  Laterality: Right;  ? INSERTION OF ILIAC STENT Left 01/14/2016  ? Procedure: INSERTION OF ILIAC STENT;  Surgeon: Waynetta Sandy, MD;  Location: [place];  Service: Vascular;  Laterality: Left;  ? INTRAOPERATIVE ARTERIOGRAM Left 01/14/2016  ? Procedure: INTRA OPERATIVE ARTERIOGRAM;  Surgeon: Waynetta Sandy, MD;  Location: [place];  Service:  Vascular;  Laterality: Left;  ? IR NEPHROSTOMY EXCHANGE LEFT  03/21/2019  ? IR NEPHROSTOMY PLACEMENT LEFT  11/28/2018  ? IRRIGATION AND DEBRIDEMENT EXTREMITY (Right   12/09/2019  ? KNEE ARTHROSCOPY Left 10/07/2019  ? Procedure: ARTHROSCOPY KNEE w/ STIMULATING  BEAD PLACMENT;  Surgeon: Meredith Pel, MD;  Location: [place];  Service: Orthopedics;  Laterality: Left;  ? LEFT HEART CATH AND CORONARY ANGIOGRAPHY N/A 10/17/2019  ? Procedure: LEFT HEART CATH AND CORONARY ANGIOGRAPHY;  Surgeon: Jettie Booze, MD;  Location: [place] [facility];  Service: Cardiovascular;  Laterality: N/A;  ? LITHOTRIPSY  x 3  ? "& a laser treatment" (02/29/2016)  ? NEPHROLITHOTOMY Left 03/26/2019  ? Procedure: NEPHROLITHOTOMY PERCUTANEOUS;  Surgeon: Alexis Frock, MD;  [facility];  Service: Urology;  Laterality: Left;  2 HRS  ? NEPHROSTOMY Bilateral 1968  ? PATELLAR TENDON REPAIR Right 1990s  ? "big incision"  ? PERIPHERAL VASCULAR CATHETERIZATION  01/14/2016  ? Procedure:  A/V SHUNTOGRAM;  Surgeon: Waynetta Sandy, MD;  Location: [place];  Service: Vascular;;  ? REMOVAL OF GRAFT Left 01/30/2020  ? Procedure: REMOVAL OF INFECTED AV GRAFT LEFT THIGH;  Surgeon: Serafina Mitchell, MD;  [facility];  Service: Vascular;  Laterality: Left;  ? REVISION OF ARTERIOVENOUS GORETEX GRAFT Left 10/16/2012  ? Procedure: REVISION OF LEFT FEMORAL LOOP ARTERIOVENOUS GORETEX GRAFT;  Surgeon: Rosetta Posner, MD;  Location: [place];  Service: Vascular;  Laterality: Left;  ? REVISION OF ARTERIOVENOUS GORETEX GRAFT Left 02/11/2015  ? Procedure: EXCISION OF SMALL SEGMENT OF EXPOSED LEFT THIGH NON FUNCTIONING  ARTERIOVENOUS GORETEX GRAFT;  Surgeon: Mal Misty, MD;  Location: [place];  Service: Vascular;  Laterality: Left;  ? REVISION OF ARTERIOVENOUS GORETEX GRAFT Left 01/14/2016  ? Procedure: REVISION OF ARTERIOVENOUS GORETEX GRAFT;  Surgeon: Waynetta Sandy, MD;  Location: [place];  Service: Vascular;  Laterality: Left;  ? REVISION OF  ARTERIOVENOUS GORETEX GRAFT Left 02/29/2016  ? thigh/pt report  ? REVISION OF ARTERIOVENOUS GORETEX GRAFT Left 02/29/2016  ? Procedure: POSSIBLE REVISION OF LEFT THIGH ARTERIOVENOUS GORETEX GRAFT;  Surgeon: Garlan Fair

## 2021-04-25 NOTE — ED Notes (Signed)
Patient  being transferred to another unit to ensure he is able to be monitored and placed on cardiac monitor; while still in ED; pt is being transferred with nad; A&O x 4 and pain controlled-Monique,RN  ?

## 2021-04-25 NOTE — ED Provider Notes (Signed)
Emergency Medicine Observation Re-evaluation Note ? ?[name] is a 56 y.o. male, seen on rounds today.  Pt initially presented to the ED for complaints of Chest Pain, Housing instability, and Mood disorder ?Currently, the patient has returned from dialysis, no distress, bp normal.  ? ?Physical Exam  ?BP 106/60   Pulse (!) 54   Temp 98.6 ?F (37 ?C)   Resp 12   Ht 1.803 m (5\' 11" )   Wt 118 kg   SpO2 100%   BMI 36.28 kg/m?  ?Physical Exam ?General: content appearing ?Cardiac: mildly bradycardic c/w prior/baseline.  ?Lungs: breathing comfortably ?Psych: content appearing, calm.  ? ?ED Course / MDM  ? ? ?I have reviewed the labs performed to date as well as medications administered while in observation.  Recent changes in the last 24 hours include ED obs, reassessment. TOC placement pending.  ? ?Plan  ? ? [name] is not under involuntary commitment. ? ?Patient remains boarding in ED, awaiting TOC placement/housing solution, given his significant medical issues and deconditioning.  ? ?Patient had requested palliative care consult - consult has been ordered, and background provided to palliative care team. ? ?Cardiology consult also placed as pt had voiced concerns re unclear plan as relates his AS. He indicates various options discussed in past including TAVR, and open surgery, but also uncertainty as whether he would be candidate for either.  Discussed pt with cardiology - they will see in consult.  ? ? ?  ?Lajean Saver, MD ?04/25/21 1518 ? ?

## 2021-04-25 NOTE — Progress Notes (Addendum)
?[facility] ?Progress Note  ? ?Subjective:   Patient seen and examined at bedside.  Reports increased pain in head and knee today from a fall a few days ago.  Admits to chills.  Concerned his LU arm wound may become infected.  Worried about where he is going to live, infection risks with his catheter.  Wants to talk with palliative care.  Denies CP, SOB , abdominal pain and n/v/d. ? ?Objective ?Vitals:  ? 04/24/21 1144 04/24/21 2005 04/25/21 0600 04/25/21 0808  ?BP: (!) 105/59 (!) 114/57 111/68 (!) 127/53  ?Pulse: (!) 51 60 (!) 54 (!) 53  ?Resp: 16 18 14 12   ?Temp: 98.4 ?F (36.9 ?C) 99 ?F (37.2 ?C) 98.1 ?F (36.7 ?C) 98.3 ?F (36.8 ?C)  ?TempSrc: Oral Oral Oral   ?SpO2: 99% 97% 98% 100%  ?Weight:    116.2 kg  ?Height:      ? ?Physical Exam ?General:chronically ill appearing male in NAD ?Heart:RRR ?Lungs:nml WOB , CTAB ?Abdomen:soft, NTND ?Extremities:+pain in L knee, no LE edema ?Dialysis Access: L thigh TDC in use  ? ?Filed Weights  ? 04/22/21 1419 04/25/21 0808  ?Weight: 123 kg 116.2 kg  ? ?No intake or output data in the 24 hours ending 04/25/21 0847 ? ?Additional Objective ?Labs: ?Basic Metabolic Panel: ?Recent Labs  ?Lab 04/22/21 ?1437  ?NA 138  ?K 3.6  ?CL 97*  ?CO2 26  ?GLUCOSE 78  ?BUN 8  ?CREATININE 4.68*  ?CALCIUM 8.6*  ? ?Liver Function Tests: ?Recent Labs  ?Lab 04/22/21 ?1437  ?AST 16  ?ALT 7  ?ALKPHOS 171*  ?BILITOT 1.1  ?PROT 8.7*  ?ALBUMIN 4.0  ? ? ?CBC: ?Recent Labs  ?Lab 04/22/21 ?1437  ?WBC 5.3  ?NEUTROABS 4.2  ?HGB 12.6*  ?HCT 40.5  ?MCV 99.0  ?PLT PLATELET CLUMPS NOTED ON SMEAR, UNABLE TO ESTIMATE  ? ? ?Medications: ? sodium chloride    ? sodium chloride    ? ? Chlorhexidine Gluconate Cloth  6 each Topical Q0600  ? cinacalcet  30 mg Oral Q M,W,F-HD  ? clopidogrel  75 mg Oral Daily  ? doxercalciferol  4 mcg Intravenous Q M,W,F-HD  ? escitalopram  5 mg Oral Daily  ? famotidine  40 mg Oral Daily  ? furosemide  80 mg Oral Daily  ? heparin  4,000 Units Dialysis Once in dialysis  ?  levothyroxine  137 mcg Oral QAC breakfast  ? lidocaine  1 patch Transdermal Q24H  ? polyethylene glycol  17 g Oral Daily  ? zolpidem  10 mg Oral QHS  ? ? ?Dialysis Orders: ?MWF - [place] ?4hrs67min, BFR 350, DFR 800,  EDW 118-118.5 (121 kg needs to be updated), 2K/ 2Ca ?Heparin 3700 units IV ?Mircera 50 last given 4/12 ?Hectorol 61mcg IV qHD ?Sensipar 90mg   ?Phoslo 667mg  4 capsules with meals and 3 with snacks ? ?Assessment/Plan: ?Suicidal ideation: cousin moved out recently and landlord raising rent. Patient is essentially homeless, last day in apartment 4/21. Now cleared by psych.  Unsure what he wants to do.  Did not express SI today. ?ESRD - on HD MWF. HD on schedule; treated 3x last week leaving lower than listed EDW of 121kg) (History of recurrent hyperkalemia on p.o. Kayexalate at home). TDC MUST be packed with cath flo q treatment.  HD today per regular schedule. ?Chronic R flank discomfort 2/2 to chronic nephrolithiasis. Pain management per primary.  ?Hypertension/volume  -BP in goal. on midodrine.  ?Anemia of CKD -Hgb 12.6 No  indication for ESA. ?Secondary Hyperparathyroidism - Ca in goal. Add on phos. Continue Hectorol and sensipar.  ?Nutrition - Renal diet with fluid restriction. Needs protein supplements ?Severe AS-followed by Dr. Angelena Form. ?Dispo- needs assistance finding safe plan for d/c.  Requesting palliative care consult - placed. ? ?Jen Mow, PA-C ?[facility] ?04/25/2021,8:47 AM ? LOS: 0 days  ? ? ?

## 2021-04-25 NOTE — Consult Note (Signed)
?Cardiology Consultation:  ? ?Patient ID: [name] ?MRN: [id number]; DOB: 1965-04-01 ? ?Admit date: 04/22/2021 ?Date of Consult: 04/25/2021 ? ?PCP:  [name], MD ?  ?[facility] Providers ?Cardiologist:  Lauree Chandler, MD      ? ? ?Patient Profile:  ? ?[name] is a 56 y.o. male with a hx of aortic stenosis (bicuspid valve) who is being seen 04/25/2021 for the evaluation of aortic stenosis at the request of Dr Ashok Cordia. ? ?History of Present Illness:  ? ?[name] has a complex history.  He was seen by Dr. Angelena Form last in November 2021 and a telehealth visit.  At that time the patient was noted to have at least moderately severe aortic stenosis and was felt to be asymptomatic.  He was noted to be a poor candidate for TAVR because of infectious issues with open lower extremity wounds as well as lack of vascular access.  He has a very complex medical history with end-stage renal disease on hemodialysis, severe vascular disease, and social issues that are barriers to his care.  He is currently boarded in the emergency department, awaiting transition of care placement and housing solution. ? ?The patient is independently interviewed.  He has had a very difficult social situation that he discusses with me today.  His cousin/roommate moved out a few weeks ago and he lost his housing, now reporting homelessness.  He has struggled with his health and has felt hopeless between his medical and cardiac problems.  Suicidal ideation brought him to the emergency department.  From a cardiac perspective, the patient reports progressive symptoms of exertional chest tightness and shortness of breath, now occurring when he walks just a few feet.  He does not have symptoms at rest.  He has been evaluated for consideration of closing his nephrostomy sites which are present on both sides.  He has not been felt to be a candidate for this.  He has had recurrent infections, precluding him from valve  replacement.  He is also had a lot of vascular problems.  He is currently dialyzing from right groin catheter access. ? ? ?Past Medical History:  ?Diagnosis Date  ? Anemia   ? Anxiety   ? Clotted dialysis access [facility]) 01/06/2020  ? Constipation   ? End stage renal failure on dialysis [facility])   ? [employer]; MWF, on HD since 1997, exhausted upper ext access, and possibly LE access; cath dependent in R groin as of 06/2018  ? GERD (gastroesophageal reflux disease)   ? Grand mal seizure [facility]) X 4  ? last 1998 (02/29/2016)  ? History of blood transfusion 2000s  ? "I was having blood loss; never found out from where"  ? History of kidney stones   ? Hypertension   ? hx of - has not taken bp meds in over 2 years  since dialysis  ? Hypothyroidism   ? Insomnia   ? Migraine   ? "due to my BP in my late 1990s; none since" (02/29/2016)  ? Nonhealing surgical wound   ? of the left arteriovenous graft  ? Severe aortic stenosis   ? no problems with per pt. - nephrologist and Dr. Coletta Memos  ? Spina bifida ([place])   ? ? ?Past Surgical History:  ?Procedure Laterality Date  ? A/V FISTULAGRAM Right 02/03/2020  ? Procedure: A/V Fistulagram;  Surgeon: Serafina Mitchell, MD;  Location: [place] [facility];  Service: Cardiovascular;  Laterality: Right;  ? AMPUTATION Left 12/11/2017  ? Procedure: Left Great  Toe Amputation;  Surgeon: [name], MD;  Location: [place];  Service: Orthopedics;  Laterality: Left;  ? APPENDECTOMY    ? APPLICATION OF WOUND VAC Left 10/04/2019  ? Procedure: APPLICATION OF WOUND VAC;  Surgeon: Serafina Mitchell, MD;  Location: [place];  Service: Vascular;  Laterality: Left;  ? ARTERIOVENOUS GRAFT PLACEMENT Left 10/16/2012  ? left femoral goretex graft         Dr Donnetta Hutching  ? AV FISTULA PLACEMENT Left 06/27/2012  ? Procedure: EXPLORATORY LEFT THY-GRAFT PSEUDO-ANEURYSM;  Surgeon: Conrad Roseland, MD;  Location: [place];  Service: Vascular;  Laterality: Left;  Revision of left Arteriovenus gortex graft in thigh.  ? [place] REMOVAL Left 10/04/2019   ? Procedure: REMOVAL OF ARTERIOVENOUS GORETEX GRAFT ([place]);  Surgeon: Serafina Mitchell, MD;  Location: [facility] OR;  Service: Vascular;  Laterality: Left;  ? COLONOSCOPY    ? DRESSING CHANGE UNDER ANESTHESIA Left 10/07/2019  ? Procedure: DRESSING CHANGE UNDER ANESTHESIA;  Surgeon: Meredith Pel, MD;  Location: [place];  Service: Orthopedics;  Laterality: Left;  ? EXCHANGE OF A DIALYSIS CATHETER Right 02/06/2020  ? Procedure: EXCHANGE OF TUNNELED DIALYSIS CATHETER;  Surgeon: Serafina Mitchell, MD;  Location: [place];  Service: Vascular;  Laterality: Right;  ? FLEXIBLE SIGMOIDOSCOPY N/A 12/03/2018  ? Procedure: FLEXIBLE SIGMOIDOSCOPY;  Surgeon: Yetta Flock, MD;  Location: [place];  Service: Gastroenterology;  Laterality: N/A;  ? I & D EXTREMITY Left 02/29/2016  ? Procedure: DEBRIDEMENT LEFT THIGH WOUND;  Surgeon: Waynetta Sandy, MD;  Location: [place];  Service: Vascular;  Laterality: Left;  ? I & D EXTREMITY Left 10/04/2019  ? Procedure: IRRIGATION AND DEBRIDEMENT LEFT THIGH;  Surgeon: Serafina Mitchell, MD;  [facility];  Service: Vascular;  Laterality: Left;  ? I & D EXTREMITY Right 12/09/2019  ? Procedure: IRRIGATION AND DEBRIDEMENT EXTREMITY;  Surgeon: Altamese [place], MD;  Location: [place];  Service: Orthopedics;  Laterality: Right;  ? ILEOSTOMY  1970s  ? INCISION AND DRAINAGE Left 10/16/2012  ? Procedure: INCISION AND Debridement left thigh graft;  Surgeon: Rosetta Posner, MD;  Location: [facility] OR;  Service: Vascular;  Laterality: Left;  ? INSERTION OF DIALYSIS CATHETER    ? INSERTION OF DIALYSIS CATHETER  01/14/2016  ? Procedure: INSERTION OF DIALYSIS CATHETER;  Surgeon: Waynetta Sandy, MD;  Location: [place];  Service: Vascular;;  ? INSERTION OF DIALYSIS CATHETER Right 08/07/2017  ? Procedure: INSERTION OF DIALYSIS CATHETER;  Surgeon: Waynetta Sandy, MD;  Location: [place];  Service: Vascular;  Laterality: Right;  ? INSERTION OF DIALYSIS CATHETER Right 12/21/2017  ? Procedure: RIGHT  FEMORAL DIALYSIS CATHETER EXCHANGE;  Surgeon: Marty Heck, MD;  Location: [place];  Service: Vascular;  Laterality: Right;  ? INSERTION OF ILIAC STENT Left 01/14/2016  ? Procedure: INSERTION OF ILIAC STENT;  Surgeon: Waynetta Sandy, MD;  Location: [place];  Service: Vascular;  Laterality: Left;  ? INTRAOPERATIVE ARTERIOGRAM Left 01/14/2016  ? Procedure: INTRA OPERATIVE ARTERIOGRAM;  Surgeon: Waynetta Sandy, MD;  Location: [place];  Service: Vascular;  Laterality: Left;  ? IR NEPHROSTOMY EXCHANGE LEFT  03/21/2019  ? IR NEPHROSTOMY PLACEMENT LEFT  11/28/2018  ? IRRIGATION AND DEBRIDEMENT EXTREMITY (Right   12/09/2019  ? KNEE ARTHROSCOPY Left 10/07/2019  ? Procedure: ARTHROSCOPY KNEE w/ STIMULATING  BEAD PLACMENT;  Surgeon: Meredith Pel, MD;  Location: [place];  Service: Orthopedics;  Laterality: Left;  ? LEFT HEART CATH AND CORONARY ANGIOGRAPHY N/A  10/17/2019  ? Procedure: LEFT HEART CATH AND CORONARY ANGIOGRAPHY;  Surgeon: Jettie Booze, MD;  Location: [place] CV [facility];  Service: Cardiovascular;  Laterality: N/A;  ? LITHOTRIPSY  x 3  ? "& a laser treatment" (02/29/2016)  ? NEPHROLITHOTOMY Left 03/26/2019  ? Procedure: NEPHROLITHOTOMY PERCUTANEOUS;  Surgeon: Alexis Frock, MD;  [facility];  Service: Urology;  Laterality: Left;  2 HRS  ? NEPHROSTOMY Bilateral 1968  ? PATELLAR TENDON REPAIR Right 1990s  ? "big incision"  ? PERIPHERAL VASCULAR CATHETERIZATION  01/14/2016  ? Procedure: A/V SHUNTOGRAM;  Surgeon: Waynetta Sandy, MD;  Location: [place];  Service: Vascular;;  ? REMOVAL OF GRAFT Left 01/30/2020  ? Procedure: REMOVAL OF INFECTED AV GRAFT LEFT THIGH;  Surgeon: Serafina Mitchell, MD;  [facility];  Service: Vascular;  Laterality: Left;  ? REVISION OF ARTERIOVENOUS GORETEX GRAFT Left 10/16/2012  ? Procedure: REVISION OF LEFT FEMORAL LOOP ARTERIOVENOUS GORETEX GRAFT;  Surgeon: Rosetta Posner, MD;  Location: [place];  Service: Vascular;  Laterality: Left;  ? REVISION OF  ARTERIOVENOUS GORETEX GRAFT Left 02/11/2015  ? Procedure: EXCISION OF SMALL SEGMENT OF EXPOSED LEFT THIGH NON FUNCTIONING  ARTERIOVENOUS GORETEX GRAFT;  Surgeon: Mal Misty, MD;  Location: [place];  Service: Vascular;  L

## 2021-04-25 NOTE — ED Notes (Signed)
Pt signed consent for dialysis and is on his way to tx  ?

## 2021-04-26 ENCOUNTER — Inpatient Hospital Stay (HOSPITAL_COMMUNITY): Payer: Medicare Other

## 2021-04-26 DIAGNOSIS — D631 Anemia in chronic kidney disease: Secondary | ICD-10-CM

## 2021-04-26 DIAGNOSIS — T829XXD Unspecified complication of cardiac and vascular prosthetic device, implant and graft, subsequent encounter: Secondary | ICD-10-CM

## 2021-04-26 DIAGNOSIS — N186 End stage renal disease: Secondary | ICD-10-CM

## 2021-04-26 DIAGNOSIS — Z789 Other specified health status: Secondary | ICD-10-CM

## 2021-04-26 DIAGNOSIS — Z66 Do not resuscitate: Secondary | ICD-10-CM

## 2021-04-26 DIAGNOSIS — Z7189 Other specified counseling: Secondary | ICD-10-CM

## 2021-04-26 DIAGNOSIS — N189 Chronic kidney disease, unspecified: Secondary | ICD-10-CM

## 2021-04-26 DIAGNOSIS — R9389 Abnormal findings on diagnostic imaging of other specified body structures: Secondary | ICD-10-CM | POA: Diagnosis not present

## 2021-04-26 DIAGNOSIS — Z515 Encounter for palliative care: Secondary | ICD-10-CM

## 2021-04-26 DIAGNOSIS — E441 Mild protein-calorie malnutrition: Secondary | ICD-10-CM

## 2021-04-26 DIAGNOSIS — L02416 Cutaneous abscess of left lower limb: Secondary | ICD-10-CM | POA: Diagnosis not present

## 2021-04-26 DIAGNOSIS — Z89412 Acquired absence of left great toe: Secondary | ICD-10-CM

## 2021-04-26 DIAGNOSIS — Z992 Dependence on renal dialysis: Secondary | ICD-10-CM

## 2021-04-26 DIAGNOSIS — Z6837 Body mass index (BMI) 37.0-37.9, adult: Secondary | ICD-10-CM

## 2021-04-26 DIAGNOSIS — I350 Nonrheumatic aortic (valve) stenosis: Secondary | ICD-10-CM

## 2021-04-26 DIAGNOSIS — I351 Nonrheumatic aortic (valve) insufficiency: Secondary | ICD-10-CM | POA: Diagnosis not present

## 2021-04-26 LAB — ECHOCARDIOGRAM COMPLETE
AR max vel: 1.72 cm2
AV Area VTI: 1.62 cm2
AV Area mean vel: 1.69 cm2
AV Mean grad: 35 mmHg
AV Peak grad: 58.8 mmHg
Ao pk vel: 3.83 m/s
Area-P 1/2: 2.62 cm2
Calc EF: 69.7 %
Height: 71 in
MV VTI: 1.94 cm2
P 1/2 time: 648 msec
S' Lateral: 2.7 cm
Single Plane A2C EF: 66.2 %
Single Plane A4C EF: 73.2 %
Weight: 4162.28 oz

## 2021-04-26 MED ORDER — LORAZEPAM 0.5 MG PO TABS
0.5000 mg | ORAL_TABLET | Freq: Four times a day (QID) | ORAL | Status: DC | PRN
  Administered 2021-04-26 – 2021-04-28 (×2): 0.5 mg via ORAL
  Filled 2021-04-26 (×2): qty 1

## 2021-04-26 MED ORDER — MIDODRINE HCL 5 MG PO TABS
10.0000 mg | ORAL_TABLET | Freq: Three times a day (TID) | ORAL | Status: DC
  Administered 2021-04-26 – 2021-04-30 (×11): 10 mg via ORAL
  Filled 2021-04-26 (×10): qty 2

## 2021-04-26 MED ORDER — CHLORHEXIDINE GLUCONATE CLOTH 2 % EX PADS: 6.0000 | MEDICATED_PAD | Freq: Every day | CUTANEOUS | Status: DC

## 2021-04-26 NOTE — ED Notes (Signed)
Breakfast order placed ?

## 2021-04-26 NOTE — ED Notes (Signed)
Messaged Dr. Sabino Gasser about patient's low bp. Pt is currently AxOx4 ? ?

## 2021-04-26 NOTE — Progress Notes (Addendum)
?HEART AND VASCULAR CENTER   ?MULTIDISCIPLINARY HEART VALVE TEAM ? ?Patient Name: [name] ?Date of Encounter: 04/26/2021 ? ?Admit date: 04/22/2021 ? ?Primary Care Provider: [name], MD ?[facility] Cardiologist: Lauree Chandler, MD  ?[facility] Electrophysiologist:  None  ? ?Hospital Problem List  ?   ?Principal Problem: ?  Angina at rest [facility] ?Active Problems: ?  Complication of AV dialysis fistula ?  End stage renal disease ([place]) ?  Nonhealing surgical wound ?  ESRD (end stage renal disease) on dialysis [facility]) ?  Left great toe amputee ([place]) ?  PAD (peripheral artery disease) ([place]) ?  Hydronephrosis with renal and ureteral calculus obstruction ?  Complication of nephrostomy ([place]) ?  Malfunction of nephrostomy tube ([place]) ?  Complication of vascular access for dialysis ?  Coronary artery calcification ?  Abscess of left thigh ?  Goals of care, counseling/discussion ?  Anemia in chronic kidney disease ?  Class 2 severe obesity due to excess calories with serious comorbidity and body mass index (BMI) of 37.0 to 37.9 in adult [facility]) ?  Essential (primary) hypertension ?  Mild protein-calorie malnutrition ([place]) ?  Personal history of Methicillin resistant Staphylococcus aureus infection ?  Aortic stenosis ?  ? ? ?Subjective  ? ?No complaints currently. He continues to be a bit depressed. He understands he is not a candidate for TAVR. Asks for something to calm his nerves.  ? ?Inpatient Medications  ?  ?Scheduled Meds: ? acetaminophen  325 mg Oral Q6H  ? aspirin EC  325 mg Oral Daily  ? calcium acetate  2,668 mg Oral TID WC  ? Chlorhexidine Gluconate Cloth  6 each Topical Q0600  ? cinacalcet  30 mg Oral Q M,W,F-HD  ? clopidogrel  75 mg Oral Daily  ? doxercalciferol  4 mcg Intravenous Q M,W,F-HD  ? escitalopram  5 mg Oral Daily  ? famotidine  40 mg Oral Daily  ? furosemide  80 mg Oral Daily  ? heparin  5,000 Units Subcutaneous Q8H  ? levothyroxine  137 mcg Oral QAC breakfast  ? lidocaine  1 patch  Transdermal Q24H  ? midodrine  10 mg Oral TID WC  ? oxyCODONE  10 mg Oral Q6H  ? polyethylene glycol  17 g Oral Daily  ? rosuvastatin  10 mg Oral QHS  ? zolpidem  10 mg Oral QHS  ? ?Continuous Infusions: ? ?PRN Meds: ?acetaminophen, calcium acetate, fluticasone, LORazepam, melatonin, ondansetron, oxyCODONE-acetaminophen, promethazine, traZODone  ? ?Vital Signs  ?  ?Vitals:  ? 04/26/21 0930 04/26/21 1000 04/26/21 1030 04/26/21 1045  ?BP: (!) 82/48 (!) 82/52 (!) 95/59 97/60  ?Pulse: (!) 51 (!) 50 (!) 51 (!) 51  ?Resp: 17 14 13 12   ?Temp:      ?TempSrc:      ?SpO2: 97% 98% 98% 99%  ?Weight:      ?Height:      ? ? ?Intake/Output Summary (Last 24 hours) at 04/26/2021 1110 ?Last data filed at 04/25/2021 1230 ?Gross per 24 hour  ?Intake --  ?Output 811 ml  ?Net -811 ml  ? ?Filed Weights  ? 04/22/21 1419 04/25/21 0808 04/25/21 1230  ?Weight: 123 kg 116.2 kg 118 kg  ? ? ?Physical Exam  ?  ?GEN: chronically ill appearing obese white male.  ?HEENT: Grossly normal.  ?Neck: Supple, no JVD, carotid bruits, or masses. ?Cardiac: RRR, 3/6 harsh late peaking systolic murmur. No LE edema. ?Respiratory:  Respirations regular and unlabored, clear to auscultation bilaterally. ?GI: Soft, nontender, nondistended, BS +  x 4. ?MS: no deformity. Legs a little atrophied. Charcot foot ?Skin: warm and dry, no rash. Multiple wounds with ecchymosis. ?Neuro:  Strength and sensation are intact. ?Psych: AAOx3.  Normal affect. ? ?Labs  ?  ?CBC ?No results for input(s): WBC, NEUTROABS, HGB, HCT, MCV, PLT in the last 72 hours. ?Basic Metabolic Panel ?No results for input(s): NA, K, CL, CO2, GLUCOSE, BUN, CREATININE, CALCIUM, MG, PHOS in the last 72 hours. ?Liver Function Tests ?No results for input(s): AST, ALT, ALKPHOS, BILITOT, PROT, ALBUMIN in the last 72 hours. ?No results for input(s): LIPASE, AMYLASE in the last 72 hours. ?Cardiac Enzymes ?No results for input(s): CKTOTAL, CKMB, CKMBINDEX, TROPONINI in the last 72 hours. ?BNP ?Invalid input(s):  POCBNP ?D-Dimer ?No results for input(s): DDIMER in the last 72 hours. ?Hemoglobin A1C ?No results for input(s): HGBA1C in the last 72 hours. ?Fasting Lipid Panel ?No results for input(s): CHOL, HDL, LDLCALC, TRIG, CHOLHDL, LDLDIRECT in the last 72 hours. ?Thyroid Function Tests ?No results for input(s): TSH, T4TOTAL, T3FREE, THYROIDAB in the last 72 hours. ? ?Invalid input(s): FREET3 ? ?Telemetry  ?  ?Sinus brady, HR 40-50s - Personally Reviewed ? ?ECG  ?  ?Sinus with IRBBB - Personally Reviewed ? ?Radiology  ?  ?CT HEAD WO CONTRAST (5MM) ? ?Result Date: 04/25/2021 ?CLINICAL DATA:  Headache, sudden, severe EXAM: CT HEAD WITHOUT CONTRAST TECHNIQUE: Contiguous axial images were obtained from the base of the skull through the vertex without intravenous contrast. RADIATION DOSE REDUCTION: This exam was performed according to the departmental dose-optimization program which includes automated exposure control, adjustment of the mA and/or kV according to patient size and/or use of iterative reconstruction technique. COMPARISON:  None. FINDINGS: Brain: No acute intracranial abnormality. Specifically, no hemorrhage, hydrocephalus, mass lesion, acute infarction, or significant intracranial injury. Vascular: No hyperdense vessel or unexpected calcification. Skull: No acute calvarial abnormality. Sinuses/Orbits: No acute findings Other: None IMPRESSION: No acute intracranial abnormality. Electronically Signed   By: Rolm Baptise M.D.   On: 04/25/2021 23:05  ? ?DG Chest Port 1 View ? ?Result Date: 04/25/2021 ?CLINICAL DATA:  Mid chest pain EXAM: PORTABLE CHEST 1 VIEW COMPARISON:  04/22/2021 FINDINGS: Transverse diameter of heart is increased. There is poor inspiration. There are no signs of pulmonary edema or focal pulmonary consolidation. IMPRESSION: Cardiomegaly. Poor inspiration. There are no signs of pulmonary edema or focal pulmonary consolidation. Electronically Signed   By: Elmer Picker M.D.   On: 04/25/2021 18:32    ? ?Cardiac Studies  ? ?Echo 10/05/19 ?IMPRESSIONS  ? 1. Left ventricular ejection fraction, by estimation, is 65 to 70%. The  ?left ventricle has normal function. The left ventricle has no regional  ?wall motion abnormalities. There is mild left ventricular hypertrophy.  ?Left ventricular diastolic parameters  ?are consistent with Grade II diastolic dysfunction (pseudonormalization).  ? 2. Right ventricular systolic function is normal. The right ventricular  ?size is normal. Tricuspid regurgitation signal is inadequate for assessing  ?PA pressure.  ? 3. Left atrial size was moderately dilated.  ? 4. The mitral valve is abnormal. Trivial mitral valve regurgitation.  ?Moderate mitral annular calcification.  ? 5. The aortic valve is functionally bicuspid. There is moderate  ?calcification of the aortic valve and evidence of severe stenosis. Aortic  ?valve mean gradient measures 37.2 mmHg. Aortic valve Vmax measures 3.88  ?m/s. Dimentionless index 0.25  ? 6. The inferior vena cava is normal in size with greater than 50%  ?respiratory variability, suggesting right atrial pressure of 3 mmHg.  ? ?  Patient Profile  ?   ?[name] is a 56 y.o. male with a history of ESRD on HD with multiple failed access sites (currently dialyzing out of a groin catheter), hydronephrosis with renal/ureteral calculus obstruction s/p bilateral nephrostomy tubes, severe PAD with previous amputations, charcot foot, anemia of chronic disease, morbid obesity, nonhealing wounds, multiple abscesses with previous bacteremia, protein calorie malnutrition, extremely poor mobility-essentially wheelchair bound, multiple social barriers with homelessness, and bicuspid aortic valve with moderately severe aortic stenosis who presented to [facility] on 4/23 due to housing instability and suicidal ideation as well as chest pain with extreme emotional upset.  ? ?Assessment & Plan  ?  ?Moderately severe AS: echo is currently being updated and  likely has severe symptomatic AS. Case was extensively reviewed with the multidisciplinary valve team this morning and he is not a candidate for transcatheter aortic valve replacement due to his extensive co

## 2021-04-26 NOTE — Progress Notes (Signed)
PT Cancellation Note ? ?Patient Details ?Name: [name] ?MRN: [id number] ?DOB: 1965-08-30 ? ? ?Cancelled Treatment:    Reason Eval/Treat Not Completed: Other (comment).  Pt is declining therapy due to having been given "devastating news" from his MD.  Follow up at another time. ? ? ?Ramond Dial ?04/26/2021, 1:06 PM ? ?Mee Hives, PT PhD ?Acute Rehab Dept. Number: [facility] [phone] and [place] [phone] ? ?

## 2021-04-26 NOTE — Progress Notes (Signed)
?Progress Note ? ? ? ?[name]   ?PJK:[id number]  ?DOB: 1965-08-01  ?DOA: 04/22/2021     1 ?PCP: [name], MD ? ?Initial CC: Chest pain, near syncope ? ?Hospital Course: ?[name] is a 56 y.o. male with medical history significant of severe aortic stenosis, ESRD on HD, recurrent kidney stone, spinal bifida, neurogenic bladder status post nephrostomy, recurrent UTI, CAD, hypothyroidism, chronic pain and narcotic dependence, who presented with anginal-like chest pain and dizziness/lightheadedness. ? ?He endorsed being under significant amount of stress lately due to now being homeless he says. He also has multiple chronic medical issues including difficulty with dialysis over the years which stresses him out also he says. ?He had presented to the ER initially on 04/22/2021 and voiced some suicidal ideations and had been placed under IVC however he states these were situational from his stress and current acute illness of feeling poorly.  After further evaluation and monitoring he was felt to be no further self-harm and had no further ideations. ? ?He was admitted for evaluations with cardiology due to concern for worsening symptomatic aortic stenosis. ? ?Interval History:  ?Resting in bed when seen in the ER this morning.  Appears lethargic and stressed but again denied any suicidal ideations nor any further thoughts.  He is mostly stressed out from his situation especially feeling that he is homeless. ?We discussed palliative care consultation which he is definitely amenable for. ? ?Assessment and Plan: ?  ?Severe symptomatic aortic stenosis ?-Greatly appreciate cardiology evaluation.  Patient has been having more progressive symptoms including chest pain and near syncope ?- After cardiology discussion, patient not considered candidate for TAVR due to multiple medical comorbidities, severe PAD, and poor social resources ?- agree with palliative care discussions at this time as well; I also brought  up the possibility of patient considering discontinuation of HD should that provide him more comfort and less stress albeit with the expectation he is pursuing a hospice route but will defer discussions to palliative care ? ?ESRD on HD ?-Nephrology on board, continue MWF HD. ?  ?Suicidal ideation ?-Resolved, no indication for 1:1 monitoring. ?-Continue current regimen of psychiatric medications ?-Case manager and palliative care also consulted ?  ?Acute headache - resolved  ?-No focal findings on exam ?- CT head also negative for acute abnormalities  ? ? Chronic anemia on CKD ?-H&H stable. ?  ?CAD ?-Continue aspirin Plavix and statin ?  ?Hypothyroidism ?-Check TSH, continue Synthroid. ? ? ? ?Old records reviewed in assessment of this patient ? ?Antimicrobials: ? ? ?DVT prophylaxis:  ?heparin injection 5,000 Units Start: 04/25/21 2200 ? ? ?Code Status:   Code Status: Full Code ? ?Disposition Plan:  pending palliative care discussions ?Status is: Inpt ? ?Objective: ?Blood pressure (!) 102/55, pulse (!) 53, temperature 98.2 ?F (36.8 ?C), temperature source Oral, resp. rate 12, height 5\' 11"  (1.803 m), weight 118 kg, SpO2 100 %.  ?Examination:  ?Physical Exam ?Constitutional:   ?   Appearance: Normal appearance.  ?   Comments: Chronically ill-appearing disheveled man lying in bed in no distress but appears worried and stressed  ?HENT:  ?   Head: Normocephalic and atraumatic.  ?   Mouth/Throat:  ?   Mouth: Mucous membranes are moist.  ?Eyes:  ?   Extraocular Movements: Extraocular movements intact.  ?Cardiovascular:  ?   Rate and Rhythm: Normal rate and regular rhythm.  ?   Heart sounds: Normal heart sounds.  ?   Comments: 3/6 HSM appreciated throughout precordium ?Pulmonary:  ?  Effort: Pulmonary effort is normal. No respiratory distress.  ?   Breath sounds: Normal breath sounds. No wheezing.  ?Abdominal:  ?   General: Bowel sounds are normal. There is no distension.  ?   Palpations: Abdomen is soft.  ?   Tenderness:  There is no abdominal tenderness.  ?Musculoskeletal:     ?   General: Swelling present. Normal range of motion.  ?   Cervical back: Normal range of motion and neck supple.  ?Skin: ?   Comments: Scattered excoriations and old fistulas noted  ?Neurological:  ?   General: No focal deficit present.  ?Psychiatric:     ?   Mood and Affect: Mood normal.     ?   Behavior: Behavior normal.  ?  ? ?Consultants:  ?Cardiology ?Nephrology ?Palliative care ? ?Procedures:  ? ? ?Data Reviewed: ?No results found for this or any previous visit (from the past 24 hour(s)).  ?I have Reviewed nursing notes, Vitals, and Lab results since pt's last encounter. Pertinent lab results : see above ?I have ordered test including BMP, CBC, Mg ?I have reviewed the last note from staff over past 24 hours ?I have discussed pt's care plan and test results with nursing staff, case manager ? ? LOS: 1 day  ? ?Dwyane Dee, MD ?[facility] ?04/26/2021, 12:22 PM ? ?

## 2021-04-26 NOTE — Progress Notes (Addendum)
Brief Palliative Medicine Progress Note: ? ?PMT consult received and chart reviewed. GOC completed with patient - full note to follow: ? ?SUMMARY OF RECOMMENDATIONS   ?Continue current medical treatment. Patient would like to see how the next two dialysis sessions (Wed and Fri) go before making the decision to stop HD/start hospice care ?Patient is considering two options: stopping HD with discharge to residential hospice vs discharge to somewhere called "[facility]" continuing HD with outpatient Palliative Care to follow. Patient does feel he is leaning toward stopping HD as he feels his quality of life has been poor, information given to him from Cardiology, in addition to HD getting increasingly difficult for him ?RN to help get patient's personal items back, including phone, so he can talk with friends/family about current situation and his thoughts on hospice ?Now DNR/DNI - durable DNR form completed. Copy was made and will be scanned into Vynca/ACP tab ?Patient is clear he would not want rehospitalization in the event of a future decline ?TOC consulted for: assistance getting cost/more information on "[facility]" per patient request ?Chaplain consulted for: HCPOA completion and patient's request to see [employer] Idelle Crouch  ?PMT will follow up Friday 4/28 for continued GOC. Patient states he will feel ready to make a decision on Friday for next steps ? ?Thank you for allowing PMT to assist in the care of this patient. ? ?[employer] Tamala Julian, FNP-BC ?Palliative Medicine Team ?Team Phone: [phone] ?NO CHARGE ? ?

## 2021-04-26 NOTE — Consult Note (Signed)
?Consultation Note ?Date: 04/26/2021  ? ?Patient Name: [name]  ?DOB: [birth year]  MRN: [id number]  Age / Sex: 56 y.o., male  ?PCP: [name], MD ?Referring Physician: Dwyane Dee, MD ? ?Reason for Consultation: Establishing goals of care ? ?HPI/Patient Profile: 56 y.o. male  with past medical history of severe aortic stenosis, ESRD on HD, recurrent kidney stone, spinal bifida, neurogenic bladder status post nephrostomy, recurrent UTI, CAD, hypothyroidism, chronic pain and narcotic dependence presented to the ED on 04/22/2021 from home with suicidal ideation with plan and chest pain.  Patient was recently forced into homelessness by unfortunate circumstances and his plan was to pull out his hemodialysis catheter and bleed out.  Patient was admitted on 04/22/2021 with suicidal ideation, acute on chronic severe aortic stenosis decompensation, acute headache. He was evaluated by psych and after several days was no longer deemed at risk for harming himself and he was cleared.  Cardiology was also consulted who found aortic stenosis progression and there are unfortunately very limited treatment options, if any.  He was also deemed not a candidate for TAVR/invasive procedures due to issues of infectious risk, lack of vascular access, comorbid medical conditions, poor functional baseline status.  Patient is on his last possible hemodialysis access site due to poor vascular access. ? ?Of note, patient has had goals of care discussions with PMT during previous hospitalizations. ? ?Clinical Assessment and Goals of Care: ?I have reviewed medical records including EPIC notes, labs, and imaging. Received report from primary RN - no acute concerns.  ? ?Went to visit patient at bedside - no family/visitors present. Patient was lying in bed awake, alert, oriented, and able to participate in conversation. No signs or non-verbal gestures of  pain or discomfort noted. No respiratory distress, increased work of breathing, or secretions noted.  Patient does endorse mild pain at this time -reviewed pain management in context of hypertension. ? ?Met with patient to discuss diagnosis, prognosis, GOC, EOL wishes, disposition, and options. ? ?I re-introduced Palliative Medicine as specialized medical care for people living with serious illness. It focuses on providing relief from the symptoms and stress of a serious illness. The goal is to improve quality of life for both the patient and the family. ? ?We discussed a brief life review of the patient as well as functional and nutritional status.  Prior to hospitalization, patient was living in a private residence with his cousin [name].  He states that their relationship has deteriorated as he and [name] have developed health issue burden.  He has a home health RN visits 1 time per week to change the dressings on his nephrostomy sites.  He states that his appetite waxes and wanes; however, over the past month he has unintentionally lost weight due to lack of appetite.  Patient tells me he has been on dialysis for 26 years and that the last 3 months have been increasingly difficult.  He has had multiple dialysis access sites and understands that his current site is his last option due to lack of vascular access.  Patient tells me he first started to notice a decline in his health around 2019 -around this time he went from being able to ambulate small distances without difficulty getting to wheelchair for dialysis to only being able to stand and pivot.  Patient tells me that he always told himself if he "felt worse after treatment I would stop dialysis."  Patient tells me that he is "fearful of the unknown."  ? ?We discussed patient's  current illness and what it means in the larger context of patient's on-going co-morbidities.  Patient has a clear understanding of his acute current medical situation.  He understands  that end-stage renal disease and his severe aortic stenosis are non-curable diseases underlying the patient's current acute medical conditions. Natural disease trajectory and expectations at EOL were discussed. I attempted to elicit values and goals of care important to the patient. The difference between aggressive medical intervention (continuing dialysis, rehospitalization's, PT ) and comfort care/stopping HD was considered in light of the patient's goals of care.  Patient tells me that he "wants to go on my own terms."  He states "I am ready" when discussing comfort care; however, he is hesitant because he "does not want to make the wrong decision."  Validated that this can be a difficult decision.  Provided reassurance that there was no right or wrong decision. ? ?Reviewed in detail options to include: 1. Continuing dialysis discharge to rehab facility with outpatient palliative care to follow.  With this option it would be uncertain where patient would live after rehab -he tells me there is somewhere called [facility]" that he may be able to afford he would like TOC to look into.  He also understands with this option he is at high risk for rehospitalization. 2.  Stopping dialysis and discharge to residential hospice facility.  We discussed hospice philosophy and goals of comfort care.  Patient seems to find a lot of joy in the fact he would be able to eat and drink as desires for end-of-life with this option. ? ?Therapeutic listening and emotional support provided as patient tells me that dialysis has been very difficult for him over the past week, including hypotension and extreme fatigue.  He feels like he would want to pursue 2 additional dialysis sessions (Wednesday and Friday) to see how they go.  This would allow him to 2 full weeks since his significant symptom burden started with dialysis and he would feel comfortable making a decision (to stop dialysis or not) on Friday after dialysis treatment.  These  2 days would also give patient time to discuss his current situation/possible transition to hospice with his friends/family.  He has not gotten his items back (including phone) since being in the psych unit of ED and has not been able to talk to friends/family -we will see if primary RN can assist in getting his phone back to him. Patient does lament over never being able to travel to [place] or see future [employer] movies.  Patient enjoys Art gallery manager, Water quality scientist, Education officer, environmental.  ? ?Advance directives, concepts specific to code status, artificial feeding and hydration, and rehospitalization were considered and discussed.  Patient does not have a living will or healthcare power of attorney -he is interested in getting HCPOA completed while in house.  He is considering 3 different people to be listed as HCPOA and is not sure at this time which two to list - 1. Betty Mussenix/sister; 2. Lauretta Grill; [place] Stroup/friend's mother.  He will consider these options while waiting for chaplain to complete paperwork. ? ?Encouraged patient to consider DNR/DNI status understanding evidenced based poor outcomes in similar hospitalized patient, as the cause of arrest is likely associated with advanced chronic/terminal illness rather than an easily reversible acute cardio-pulmonary event. I explained that DNR/DNI does not change the medical plan and it only comes into effect after a person has arrested (died).  It is a protective measure to keep [place] from harming  the patient in their last moments of life. Patient was agreeable to DNR/DNI with understanding that he would not receive CPR, defibrillation, ACLS medications, or intubation.  ? ?Patient request to see Catholic priest -will notify chaplain. ? ?Patient denies thoughts of harming himself today. ? ?Discussed with patient the importance of continued conversation with each other and the medical providers regarding overall plan of care and treatment options, ensuring  decisions are within the context of the patient?s values and GOCs.   ? ?Questions and concerns were addressed. The patient/family was encouraged to call with questions and/or concerns. PMT card was provided

## 2021-04-26 NOTE — Progress Notes (Signed)
[facility] ?NEPHROLOGY PROGRESS NOTE ? ?Assessment/ Plan: ?Pt is a 56 y.o. yo male with history of severe AS, neurogenic bladder, CAD, chronic pain, multiple social issues, ESRD on HD with chest pain and near syncope. ? ?Dialysis Orders: ?MWF - [facility] ([place]) ?4hrs59min, BFR 350, DFR 800,  EDW 118-118.5 (121 kg needs to be updated), 2K/ 2Ca ?Heparin 3700 units IV ?Mircera 50 last given 4/12 ?Hectorol 56mcg IV qHD ?Sensipar 90mg   ?Phoslo 667mg  4 capsules with meals and 3 with snacks ? ?#Severe aortic stenosis, symptomatic with chest pain and shortness of breath: Seen by cardiologist, getting echocardiogram.  It seems like the treatment options will likely be very limited due to multiple comorbidities. ? ?# ESRD on HD, MWF: Status post HD yesterday, he has groin TDC which must be packed with Cathflo and every treatment.  Next HD tomorrow. ? ?# Anemia of ESRD: Hemoglobin above goal, no need for ESA. ? ?# Secondary hyperparathyroidism: Continue PhosLo, Sensipar, Hectorol.  Monitor lab. ? ?# HTN/volume: Blood pressure is low and feels weak.  I will start midodrine 10 mg 3 times daily.  Monitor blood pressure. ? ?#Suicidal ideation: Multiple social issues and seems like he is homeless now.  Follow-up with Education officer, museum. ? ?Subjective: Seen and examined in ER.  Reports intermittent chest pain mainly discomfort.  No shortness of breath on rest.  No nausea or vomiting.  Blood pressure is low.  Feels weak and tired. ?Objective ?Vital signs in last 24 hours: ?Vitals:  ? 04/26/21 0815 04/26/21 0845 04/26/21 0847 04/26/21 0930  ?BP: 125/65 (!) 89/59  (!) 82/48  ?Pulse: (!) 49 (!) 52  (!) 51  ?Resp: 13 14  17   ?Temp:   98.2 ?F (36.8 ?C)   ?TempSrc:   Oral   ?SpO2: 100% 98%  97%  ?Weight:      ?Height:      ? ?Weight change:  ? ?Intake/Output Summary (Last 24 hours) at 04/26/2021 1047 ?Last data filed at 04/25/2021 1230 ?Gross per 24 hour  ?Intake --  ?Output 811 ml  ?Net -811 ml   ? ? ? ? ? ?Labs: ?Basic Metabolic Panel: ?Recent Labs  ?Lab 04/22/21 ?1437  ?NA 138  ?K 3.6  ?CL 97*  ?CO2 26  ?GLUCOSE 78  ?BUN 8  ?CREATININE 4.68*  ?CALCIUM 8.6*  ? ?Liver Function Tests: ?Recent Labs  ?Lab 04/22/21 ?1437  ?AST 16  ?ALT 7  ?ALKPHOS 171*  ?BILITOT 1.1  ?PROT 8.7*  ?ALBUMIN 4.0  ? ?No results for input(s): LIPASE, AMYLASE in the last 168 hours. ?No results for input(s): AMMONIA in the last 168 hours. ?CBC: ?Recent Labs  ?Lab 04/22/21 ?1437  ?WBC 5.3  ?NEUTROABS 4.2  ?HGB 12.6*  ?HCT 40.5  ?MCV 99.0  ?PLT PLATELET CLUMPS NOTED ON SMEAR, UNABLE TO ESTIMATE  ? ?Cardiac Enzymes: ?No results for input(s): CKTOTAL, CKMB, CKMBINDEX, TROPONINI in the last 168 hours. ?CBG: ?No results for input(s): GLUCAP in the last 168 hours. ? ?Iron Studies: No results for input(s): IRON, TIBC, TRANSFERRIN, FERRITIN in the last 72 hours. ?Studies/Results: ?CT HEAD WO CONTRAST (5MM) ? ?Result Date: 04/25/2021 ?CLINICAL DATA:  Headache, sudden, severe EXAM: CT HEAD WITHOUT CONTRAST TECHNIQUE: Contiguous axial images were obtained from the base of the skull through the vertex without intravenous contrast. RADIATION DOSE REDUCTION: This exam was performed according to the departmental dose-optimization program which includes automated exposure control, adjustment of the mA and/or kV according to patient size and/or use of iterative  reconstruction technique. COMPARISON:  None. FINDINGS: Brain: No acute intracranial abnormality. Specifically, no hemorrhage, hydrocephalus, mass lesion, acute infarction, or significant intracranial injury. Vascular: No hyperdense vessel or unexpected calcification. Skull: No acute calvarial abnormality. Sinuses/Orbits: No acute findings Other: None IMPRESSION: No acute intracranial abnormality. Electronically Signed   By: Rolm Baptise M.D.   On: 04/25/2021 23:05  ? ?DG Chest Port 1 View ? ?Result Date: 04/25/2021 ?CLINICAL DATA:  Mid chest pain EXAM: PORTABLE CHEST 1 VIEW COMPARISON:  04/22/2021  FINDINGS: Transverse diameter of heart is increased. There is poor inspiration. There are no signs of pulmonary edema or focal pulmonary consolidation. IMPRESSION: Cardiomegaly. Poor inspiration. There are no signs of pulmonary edema or focal pulmonary consolidation. Electronically Signed   By: Elmer Picker M.D.   On: 04/25/2021 18:32   ? ?Medications: ?Infusions: ? ? ?Scheduled Medications: ? acetaminophen  325 mg Oral Q6H  ? aspirin EC  325 mg Oral Daily  ? calcium acetate  2,668 mg Oral TID WC  ? Chlorhexidine Gluconate Cloth  6 each Topical Q0600  ? cinacalcet  30 mg Oral Q M,W,F-HD  ? clopidogrel  75 mg Oral Daily  ? doxercalciferol  4 mcg Intravenous Q M,W,F-HD  ? escitalopram  5 mg Oral Daily  ? famotidine  40 mg Oral Daily  ? furosemide  80 mg Oral Daily  ? heparin  5,000 Units Subcutaneous Q8H  ? levothyroxine  137 mcg Oral QAC breakfast  ? lidocaine  1 patch Transdermal Q24H  ? oxyCODONE  10 mg Oral Q6H  ? polyethylene glycol  17 g Oral Daily  ? rosuvastatin  10 mg Oral QHS  ? zolpidem  10 mg Oral QHS  ? ? have reviewed scheduled and prn medications. ? ?Physical Exam: ?General: Able to lie flat, ill looking male. ?Heart:RRR, D8Y6 nl, systolic murmur ?Lungs:clear b/l, no crackle ?Abdomen:soft, Non-tender, non-distended ?Extremities:No edema ?Dialysis Access: Left thigh TDC ? ?[name] ?04/26/2021,10:47 AM ? LOS: 1 day  ? ?

## 2021-04-27 DIAGNOSIS — I350 Nonrheumatic aortic (valve) stenosis: Secondary | ICD-10-CM | POA: Diagnosis not present

## 2021-04-27 LAB — CBC WITH DIFFERENTIAL/PLATELET
Abs Immature Granulocytes: 0.01 10*3/uL (ref 0.00–0.07)
Basophils Absolute: 0 10*3/uL (ref 0.0–0.1)
Basophils Relative: 1 %
Eosinophils Absolute: 0.2 10*3/uL (ref 0.0–0.5)
Eosinophils Relative: 5 %
HCT: 35.4 % — ABNORMAL LOW (ref 39.0–52.0)
Hemoglobin: 10.9 g/dL — ABNORMAL LOW (ref 13.0–17.0)
Immature Granulocytes: 0 %
Lymphocytes Relative: 18 %
Lymphs Abs: 0.8 10*3/uL (ref 0.7–4.0)
MCH: 30.2 pg (ref 26.0–34.0)
MCHC: 30.8 g/dL (ref 30.0–36.0)
MCV: 98.1 fL (ref 80.0–100.0)
Monocytes Absolute: 0.4 10*3/uL (ref 0.1–1.0)
Monocytes Relative: 9 %
Neutro Abs: 3 10*3/uL (ref 1.7–7.7)
Neutrophils Relative %: 67 %
Platelets: 99 10*3/uL — ABNORMAL LOW (ref 150–400)
RBC: 3.61 MIL/uL — ABNORMAL LOW (ref 4.22–5.81)
RDW: 14.8 % (ref 11.5–15.5)
WBC: 4.4 10*3/uL (ref 4.0–10.5)
nRBC: 0 % (ref 0.0–0.2)

## 2021-04-27 LAB — HEPATITIS B SURFACE ANTIGEN: Hepatitis B Surface Ag: NONREACTIVE

## 2021-04-27 LAB — RENAL FUNCTION PANEL
Albumin: 3.3 g/dL — ABNORMAL LOW (ref 3.5–5.0)
Anion gap: 8 (ref 5–15)
BUN: 44 mg/dL — ABNORMAL HIGH (ref 6–20)
CO2: 29 mmol/L (ref 22–32)
Calcium: 8.6 mg/dL — ABNORMAL LOW (ref 8.9–10.3)
Chloride: 99 mmol/L (ref 98–111)
Creatinine, Ser: 9.52 mg/dL — ABNORMAL HIGH (ref 0.61–1.24)
GFR, Estimated: 6 mL/min — ABNORMAL LOW (ref >60)
Glucose, Bld: 82 mg/dL (ref 70–99)
Phosphorus: 5.1 mg/dL — ABNORMAL HIGH (ref 2.5–4.6)
Potassium: 5.2 mmol/L — ABNORMAL HIGH (ref 3.5–5.1)
Sodium: 136 mmol/L (ref 135–145)

## 2021-04-27 LAB — MAGNESIUM: Magnesium: 2.2 mg/dL (ref 1.7–2.4)

## 2021-04-27 LAB — MRSA NEXT GEN BY PCR, NASAL: MRSA by PCR Next Gen: NOT DETECTED

## 2021-04-27 MED ORDER — ALTEPLASE 2 MG IJ SOLR
INTRAMUSCULAR | Status: AC
  Administered 2021-04-27: 2 mg
  Filled 2021-04-27: qty 2

## 2021-04-27 MED ORDER — OXYCODONE-ACETAMINOPHEN 5-325 MG PO TABS
1.0000 | ORAL_TABLET | ORAL | Status: DC | PRN
  Administered 2021-04-27 – 2021-05-01 (×14): 2 via ORAL
  Administered 2021-05-01: 1 via ORAL
  Administered 2021-05-01 – 2021-05-02 (×2): 2 via ORAL
  Filled 2021-04-27 (×17): qty 2

## 2021-04-27 MED ORDER — ASPIRIN EC 81 MG PO TBEC
81.0000 mg | DELAYED_RELEASE_TABLET | Freq: Every day | ORAL | Status: DC
  Administered 2021-04-28 – 2021-05-02 (×5): 81 mg via ORAL
  Filled 2021-04-27 (×5): qty 1

## 2021-04-27 MED ORDER — MIDODRINE HCL 5 MG PO TABS
ORAL_TABLET | ORAL | Status: AC
  Filled 2021-04-27: qty 2

## 2021-04-27 MED ORDER — SODIUM CHLORIDE 0.9 % IV SOLN
12.5000 mg | Freq: Once | INTRAVENOUS | Status: AC
  Administered 2021-04-27: 12.5 mg via INTRAVENOUS
  Filled 2021-04-27: qty 0.5

## 2021-04-27 NOTE — Progress Notes (Signed)
TR[facility] night cross cover note: ? ?I was notified by RN of patient's bradycardia overnight, sustained heart rates in the 50s, occasionally dipping into the 40s, with low heart rate overnight noted to be 45.  Patient without any associated acute complaints.  Blood pressure 91/66; maintaining O2 sat of 100% on 2 L nasal cannula overnight.  ? ?Per my chart review, it appears that the patient was also running bradycardic during dayshift yesterday, with similar heart rates in the 50s.  Cardiology consulted during this hospitalization.  ? ?Per my review of patient's current medications, not currently on any AV nodal blocking agents. ? ?Morning labs notable for potassium of 5.2 in this patient with end-stage renal disease on hemodialysis on Monday, Weds, Friday schedule.  Nephrology following, with plans for next scheduled HD session this morning.  ? ? ? ?Babs Bertin, DO ?Hospitalist ? ?

## 2021-04-27 NOTE — Consult Note (Signed)
[facility] Nurse ostomy consult note ?Stoma type/location: RLQ, ileal conduit.  NOT ileostomy  ?Stomal assessment/size: patient is independent in his care ?Peristomal assessment: NA ?Treatment options for stomal/peristomal skin: skin barrier wipes used sometimes  ?Output minimal; only mucous  ?Ostomy pouching: 2pc. 2 1/4; patient using convatec pouch currently.  Has some issues with other manufacturers skin barrier. He is agreeable to having me order [employer]ister 2pc drainable pouch.  He reports he only changes his pouch about 2x per month due to be anuric from ESRD.  I wanted to make sure he has something in the room just in case something happens to this current pouch.  ? ?[facility] Nurse Consult Note: ?Reason for Consult: LUE, inner left forearm ?Wound type: superficial skin loss; appears to be healed, old HD access site per patient  ?Pressure Injury POA: NA ?Measurement: no open wound ?Wound bed: no open wound; two tiny areas that are not really open  ?Drainage (amount, consistency, odor) none seen ?Periwound: ecchymosis  ?Dressing procedure/placement/frequency: ?No topical wound care needed  ? ?Discussed POC with patient and bedside nurse.  ?Re consult if needed, will not follow at this time. ?Thanks ? Sunshine Mackowski [facility] MSN, RN,CWOCN, CNS, CWON-AP [phone])  ? ? ?  ?

## 2021-04-27 NOTE — Progress Notes (Signed)
?Progress Note ? ? ? ?[name]   ?QQI:[id number]  ?DOB: 05/02/1965  ?DOA: 04/22/2021     2 ?PCP: [name], MD ? ?Initial CC: Chest pain, near syncope ? ?Hospital Course: ?[name] is a 56 y.o. male with medical history significant of severe aortic stenosis, ESRD on HD, recurrent kidney stone, spinal bifida, neurogenic bladder status post nephrostomy, recurrent UTI, CAD, hypothyroidism, chronic pain and narcotic dependence, who presented with anginal-like chest pain and dizziness/lightheadedness. ? ?He endorsed being under significant amount of stress lately due to now being homeless he says. He also has multiple chronic medical issues including difficulty with dialysis over the years which stresses him out also he says. ?He had presented to the ER initially on 04/22/2021 and voiced some suicidal ideations and had been placed under IVC however he states these were situational from his stress and current acute illness of feeling poorly.  After further evaluation and monitoring he was felt to be no further self-harm and had no further ideations. ? ?He was admitted for evaluations with cardiology due to concern for worsening symptomatic aortic stenosis. ? ?Interval History:  ?Patient more comfortable when seen this morning in dialysis.  He had a very long and good conversation with palliative care yesterday.  He is now considering possible transition to hospice and discontinuing dialysis.  He plans to continue considering his options this week and having further discussions with palliative care on Friday. ?When seen this morning, he seemed to be much more at peace with his thought process. ? ?Assessment and Plan: ?  ?Severe symptomatic aortic stenosis ?-Greatly appreciate cardiology evaluation.  Patient has been having more progressive symptoms including chest pain and near syncope ?- After cardiology discussion, patient not considered candidate for TAVR due to multiple medical comorbidities, severe  PAD, and poor social resources ?-Greatly appreciate and agree with palliative care discussions with patient.  He is now considering possible discontinuation of dialysis and pursuing hospice.  He plans to think about this further throughout the week and have another meeting with palliative care on Friday. ? ?ESRD on HD ?-Nephrology on board, continue MWF HD. ?- see above discussion ?  ?Suicidal ideation - resolved ?-Resolved, no indication for 1:1 monitoring. ?-Continue current regimen of psychiatric medications ?-Case manager and palliative care also consulted ?  ?Acute headache - resolved  ?-No focal findings on exam ?- CT head also negative for acute abnormalities  ? ? Chronic anemia on CKD ?-H&H stable. ?  ?CAD ?-Continue aspirin Plavix and statin ?  ?Hypothyroidism ?-continue Synthroid. ? ? ? ?Old records reviewed in assessment of this patient ? ?Antimicrobials: ? ? ?DVT prophylaxis:  ?heparin injection 5,000 Units Start: 04/25/21 2200 ? ? ?Code Status:   Code Status: DNR ? ?Disposition Plan:  pending palliative care discussions; next one on 4/28 ?Status is: Inpt ? ?Objective: ?Blood pressure 120/61, pulse (!) 50, temperature 97.8 ?F (36.6 ?C), temperature source Temporal, resp. rate 18, height 5\' 11"  (1.803 m), weight 117.8 kg, SpO2 98 %.  ?Examination:  ?Physical Exam ?Constitutional:   ?   Appearance: Normal appearance.  ?   Comments: Chronically ill-appearing disheveled man lying in bed in NAD and appears more comfortable today   ?HENT:  ?   Head: Normocephalic and atraumatic.  ?   Mouth/Throat:  ?   Mouth: Mucous membranes are moist.  ?Eyes:  ?   Extraocular Movements: Extraocular movements intact.  ?Cardiovascular:  ?   Rate and Rhythm: Normal rate and regular rhythm.  ?  Heart sounds: Normal heart sounds.  ?   Comments: 3/6 HSM appreciated throughout precordium ?Pulmonary:  ?   Effort: Pulmonary effort is normal. No respiratory distress.  ?   Breath sounds: Normal breath sounds. No wheezing.  ?Abdominal:   ?   General: Bowel sounds are normal. There is no distension.  ?   Palpations: Abdomen is soft.  ?   Tenderness: There is no abdominal tenderness.  ?Musculoskeletal:     ?   General: Swelling present. Normal range of motion.  ?   Cervical back: Normal range of motion and neck supple.  ?Skin: ?   Comments: Scattered excoriations and old fistulas noted  ?Neurological:  ?   General: No focal deficit present.  ?Psychiatric:     ?   Mood and Affect: Mood normal.     ?   Behavior: Behavior normal.  ?  ? ?Consultants:  ?Cardiology ?Nephrology ?Palliative care ? ?Procedures:  ? ? ?Data Reviewed: ?Results for orders placed or performed during the hospital encounter of 04/22/21 (from the past 24 hour(s))  ?CBC with Differential/Platelet     Status: Abnormal  ? Collection Time: 04/27/21  1:28 AM  ?Result Value Ref Range  ? WBC 4.4 4.0 - 10.5 K/uL  ? RBC 3.61 (L) 4.22 - 5.81 MIL/uL  ? Hemoglobin 10.9 (L) 13.0 - 17.0 g/dL  ? HCT 35.4 (L) 39.0 - 52.0 %  ? MCV 98.1 80.0 - 100.0 fL  ? MCH 30.2 26.0 - 34.0 pg  ? MCHC 30.8 30.0 - 36.0 g/dL  ? RDW 14.8 11.5 - 15.5 %  ? Platelets 99 (L) 150 - 400 K/uL  ? nRBC 0.0 0.0 - 0.2 %  ? Neutrophils Relative % 67 %  ? Neutro Abs 3.0 1.7 - 7.7 K/uL  ? Lymphocytes Relative 18 %  ? Lymphs Abs 0.8 0.7 - 4.0 K/uL  ? Monocytes Relative 9 %  ? Monocytes Absolute 0.4 0.1 - 1.0 K/uL  ? Eosinophils Relative 5 %  ? Eosinophils Absolute 0.2 0.0 - 0.5 K/uL  ? Basophils Relative 1 %  ? Basophils Absolute 0.0 0.0 - 0.1 K/uL  ? Immature Granulocytes 0 %  ? Abs Immature Granulocytes 0.01 0.00 - 0.07 K/uL  ?Magnesium     Status: None  ? Collection Time: 04/27/21  1:28 AM  ?Result Value Ref Range  ? Magnesium 2.2 1.7 - 2.4 mg/dL  ?Renal function panel     Status: Abnormal  ? Collection Time: 04/27/21  1:28 AM  ?Result Value Ref Range  ? Sodium 136 135 - 145 mmol/L  ? Potassium 5.2 (H) 3.5 - 5.1 mmol/L  ? Chloride 99 98 - 111 mmol/L  ? CO2 29 22 - 32 mmol/L  ? Glucose, Bld 82 70 - 99 mg/dL  ? BUN 44 (H) 6 - 20  mg/dL  ? Creatinine, Ser 9.52 (H) 0.61 - 1.24 mg/dL  ? Calcium 8.6 (L) 8.9 - 10.3 mg/dL  ? Phosphorus 5.1 (H) 2.5 - 4.6 mg/dL  ? Albumin 3.3 (L) 3.5 - 5.0 g/dL  ? GFR, Estimated 6 (L) >60 mL/min  ? Anion gap 8 5 - 15  ?Hepatitis B surface antigen     Status: None  ? Collection Time: 04/27/21  8:33 AM  ?Result Value Ref Range  ? Hepatitis B Surface Ag NON REACTIVE NON REACTIVE  ?  ?I have Reviewed nursing notes, Vitals, and Lab results since pt's last encounter. Pertinent lab results : see above ?I have ordered test including  BMP, CBC, Mg ?I have reviewed the last note from staff over past 24 hours ?I have discussed pt's care plan and test results with nursing staff, case manager ? ? LOS: 2 days  ? ?Dwyane Dee, MD ?[facility] ?04/27/2021, 1:00 PM ? ?

## 2021-04-27 NOTE — Progress Notes (Addendum)
?[facility] ?Progress Note  ? ?Subjective:  Seen on HD - no CP/dyspnea. HR 45 - 55 range. No dizziness. Has been contemplating stopping HD, tells me today might be the last day. Appreciate palliative care services. Plans to make a decision on Friday. Asking for IV phenergan for nausea "one last time." ? ?Objective ?Vitals:  ? 04/27/21 0300 04/27/21 0500 04/27/21 0800 04/27/21 0820  ?BP: (!) 91/58 91/66 (!) 77/36 98/85  ?Pulse: (!) 45 (!) 51 (!) 45 (!) 52  ?Resp: 16 12 13 18   ?Temp: 98.2 ?F (36.8 ?C) 98.5 ?F (36.9 ?C)  (!) 97.3 ?F (36.3 ?C)  ?TempSrc: Oral Oral  Temporal  ?SpO2: 98% 100% 98% 99%  ?Weight:    118.9 kg  ?Height:      ? ?Physical Exam ?General: Chronically ill appearing man, NAD. ?Heart: Mild bradycardia, 3/6 murmur ?Lungs: CTAB; no wheezing ?Abdomen: soft ?Extremities: No LE edema ?Dialysis Access: thigh TDC ? ?Additional Objective ?Labs: ?Basic Metabolic Panel: ?Recent Labs  ?Lab 04/22/21 ?1437 04/27/21 ?0128  ?NA 138 136  ?K 3.6 5.2*  ?CL 97* 99  ?CO2 26 29  ?GLUCOSE 78 82  ?BUN 8 44*  ?CREATININE 4.68* 9.52*  ?CALCIUM 8.6* 8.6*  ?PHOS  --  5.1*  ? ?Liver Function Tests: ?Recent Labs  ?Lab 04/22/21 ?1437 04/27/21 ?0128  ?AST 16  --   ?ALT 7  --   ?ALKPHOS 171*  --   ?BILITOT 1.1  --   ?PROT 8.7*  --   ?ALBUMIN 4.0 3.3*  ? ?CBC: ?Recent Labs  ?Lab 04/22/21 ?1437 04/27/21 ?0128  ?WBC 5.3 4.4  ?NEUTROABS 4.2 3.0  ?HGB 12.6* 10.9*  ?HCT 40.5 35.4*  ?MCV 99.0 98.1  ?PLT PLATELET CLUMPS NOTED ON SMEAR, UNABLE TO ESTIMATE 99*  ? ?Studies/Results: ?CT HEAD WO CONTRAST (5MM) ? ?Result Date: 04/25/2021 ?CLINICAL DATA:  Headache, sudden, severe EXAM: CT HEAD WITHOUT CONTRAST TECHNIQUE: Contiguous axial images were obtained from the base of the skull through the vertex without intravenous contrast. RADIATION DOSE REDUCTION: This exam was performed according to the departmental dose-optimization program which includes automated exposure control, adjustment of the mA and/or kV according to patient  size and/or use of iterative reconstruction technique. COMPARISON:  None. FINDINGS: Brain: No acute intracranial abnormality. Specifically, no hemorrhage, hydrocephalus, mass lesion, acute infarction, or significant intracranial injury. Vascular: No hyperdense vessel or unexpected calcification. Skull: No acute calvarial abnormality. Sinuses/Orbits: No acute findings Other: None IMPRESSION: No acute intracranial abnormality. Electronically Signed   By: Rolm Baptise M.D.   On: 04/25/2021 23:05  ? ?DG Chest Port 1 View ? ?Result Date: 04/25/2021 ?CLINICAL DATA:  Mid chest pain EXAM: PORTABLE CHEST 1 VIEW COMPARISON:  04/22/2021 FINDINGS: Transverse diameter of heart is increased. There is poor inspiration. There are no signs of pulmonary edema or focal pulmonary consolidation. IMPRESSION: Cardiomegaly. Poor inspiration. There are no signs of pulmonary edema or focal pulmonary consolidation. Electronically Signed   By: Elmer Picker M.D.   On: 04/25/2021 18:32  ? ?ECHOCARDIOGRAM COMPLETE ? ?Result Date: 04/26/2021 ?   ECHOCARDIOGRAM REPORT   Patient Name:   [name] Date of Exam: 04/26/2021 Medical Rec #:  [id number]        Height:       71.0 in Accession #:    [id number]       Weight:       260.1 lb Date of Birth:  1965/07/23       BSA:  2.358 m? Patient Age:    56 years         BP:           102/61 mmHg Patient Gender: M                HR:           55 bpm. Exam Location:  Inpatient Procedure: 2D Echo, Cardiac Doppler, Color Doppler and Strain Analysis Indications:    AS  History:        Patient has prior history of Echocardiogram examinations, most                 recent 10/05/2019. Signs/Symptoms:Murmur; Risk                 Factors:Hypertension.  Sonographer:    Luisa Hart RDCS Referring Phys: 35 Kostantinos COOPER  Sonographer Comments: Technically difficult study due to poor echo windows. IMPRESSIONS  1. Left ventricular ejection fraction, by estimation, is 60 to 65%. The left ventricle has  normal function. The left ventricle has no regional wall motion abnormalities. There is mild concentric left ventricular hypertrophy. Left ventricular diastolic parameters are consistent with Grade III diastolic dysfunction (restrictive).  2. Right ventricular systolic function is normal. The right ventricular size is normal. Tricuspid regurgitation signal is inadequate for assessing PA pressure.  3. Left atrial size was mildly dilated.  4. A small pericardial effusion is present. The pericardial effusion is anterior to the right ventricle.  5. The mitral valve is abnormal. No evidence of mitral valve regurgitation. Mild mitral stenosis. The mean mitral valve gradient is 5.0 mmHg with average heart rate of 55 bpm. Moderate mitral annular calcification.  6. The aortic valve was not well visualized. Aortic valve regurgitation is mild. Aortic valve mean gradient measures 35.0 mmHg. Normal LV Stroke Volume Index. DVI 0.29. AVA 1.2 by Continuity equation. Moderate to severe aortic stenosis. Comparison(s): Similar aortic valve gradients from 2021 study. FINDINGS  Left Ventricle: Left ventricular ejection fraction, by estimation, is 60 to 65%. The left ventricle has normal function. The left ventricle has no regional wall motion abnormalities. The left ventricular internal cavity size was normal in size. There is  mild concentric left ventricular hypertrophy. Left ventricular diastolic parameters are consistent with Grade III diastolic dysfunction (restrictive). Right Ventricle: The right ventricular size is normal. No increase in right ventricular wall thickness. Right ventricular systolic function is normal. Tricuspid regurgitation signal is inadequate for assessing PA pressure. Left Atrium: Left atrial size was mildly dilated. Right Atrium: Right atrial size was normal in size. Pericardium: A small pericardial effusion is present. The pericardial effusion is anterior to the right ventricle. Presence of epicardial fat  layer. Mitral Valve: The mitral valve is abnormal. Moderate mitral annular calcification. No evidence of mitral valve regurgitation. Mild mitral valve stenosis. MV peak gradient, 15.1 mmHg. The mean mitral valve gradient is 5.0 mmHg with average heart rate of 55 bpm. Tricuspid Valve: The tricuspid valve is normal in structure. Tricuspid valve regurgitation is not demonstrated. No evidence of tricuspid stenosis. Aortic Valve: The aortic valve was not well visualized. Aortic valve regurgitation is mild. Aortic regurgitation PHT measures 648 msec. Aortic valve mean gradient measures 35.0 mmHg. Aortic valve peak gradient measures 58.8 mmHg. Aortic valve area, by VTI measures 1.62 cm?. Pulmonic Valve: The pulmonic valve was not well visualized. Pulmonic valve regurgitation is not visualized. No evidence of pulmonic stenosis. Aorta: The aortic root and ascending aorta are structurally normal, with no evidence of  dilitation. IAS/Shunts: No atrial level shunt detected by color flow Doppler.  LEFT VENTRICLE PLAX 2D LVIDd:         5.50 cm     Diastology LVIDs:         2.70 cm     LV e' medial:    3.94 cm/s LV PW:         1.30 cm     LV E/e' medial:  45.9 LV IVS:        1.20 cm     LV e' lateral:   5.18 cm/s LVOT diam:     2.70 cm     LV E/e' lateral: 34.9 LV SV:         140 LV SV Index:   59          2D Longitudinal Strain LVOT Area:     5.73 cm?    2D Strain GLS Avg:     -20.0 %  LV Volumes (MOD) LV vol d, MOD A2C: 45.0 ml LV vol d, MOD A4C: 75.0 ml LV vol s, MOD A2C: 15.2 ml LV vol s, MOD A4C: 20.1 ml LV SV MOD A2C:     29.8 ml LV SV MOD A4C:     75.0 ml LV SV MOD BP:      42.0 ml RIGHT VENTRICLE RV Basal diam:  3.70 cm RV Mid diam:    2.00 cm TAPSE (M-mode): 1.6 cm LEFT ATRIUM              Index        RIGHT ATRIUM           Index LA Vol (A2C):   108.0 ml 45.80 ml/m?  RA Area:     11.10 cm? LA Vol (A4C):   73.4 ml  31.13 ml/m?  RA Volume:   21.20 ml  8.99 ml/m? LA Biplane Vol: 91.3 ml  38.72 ml/m?  AORTIC VALVE                      PULMONIC VALVE AV Area (Vmax):    1.72 cm?      PV Vmax:       1.01 m/s AV Area (Vmean):   1.69 cm?      PV Vmean:      69.400 cm/s AV Area (VTI):     1.62 cm?      PV VTI:        0.247 m AV Vmax:

## 2021-04-27 NOTE — Procedures (Signed)
Patient was seen on dialysis and the procedure was supervised.  BFR 300  Via groin TDC BP is  95/60, on midodrine. ? ? Patient appears to be tolerating treatment well.  ? ?[place] ?04/27/2021 ? ?

## 2021-04-27 NOTE — Consult Note (Signed)
WOC consulted for ileostomy and arm wound. Discussed patient with bedside nurse bc patient has left for HD.  Appears patient is planning hospice care or residential care with palliative focus.  Making a decision related to HD on Friday.  The ileostomy is old, reported from 86s.  Patient independent in care, bedside nursing needs order/supply for care.  Will update once patient returns for HD.  Bedside nurse to advise WOC of condition of LUE wound as well.  ? ?[place], CNS, CWON-AP ?[phone]  ?

## 2021-04-27 NOTE — Progress Notes (Signed)
Physical Therapy Treatment ?Patient Details ?Name: [name] ?MRN: [id number] ?DOB: 1965/11/13 ?Today's Date: 04/27/2021 ? ? ?History of Present Illness Pt is 56 y/o male presented to ED on 04/22/2021 with complaints of chest pain and suicidal ideation after recently being forced into homelessness. Pt with severe aortic stenosis, not TAVR candidate, is now considering hospice (depending on how HD goes this Friday 4/28) vs palliative.  ESRDPMH: ESRD, seizure disorder, HTN, aortic stenosis, spina bifida, anemia, obesity, anxiety ? ?  ?PT Comments  ? ? Limited treatment today due to pt entering personal conversation on phone during session as well as fatigue from HD.  He was able to transfer to EOB and perform a few exercises.  Pt currently with difficult medical decisions: stopping HD and going hospice vs continuing HD and reports would d/c to hotel if this was the case.  Will cont to progress as able. ?  ?Recommendations for follow up therapy are one component of a multi-disciplinary discharge planning process, led by the attending physician.  Recommendations may be updated based on patient status, additional functional criteria and insurance authorization. ? ?Follow Up Recommendations ? Skilled nursing-short term rehab (<3 hours/day) ?  ?  ?Assistance Recommended at Discharge Intermittent Supervision/Assistance  ?Patient can return home with the following A lot of help with bathing/dressing/bathroom;Assistance with cooking/housework;Assist for transportation;Help with stairs or ramp for entrance;A lot of help with walking and/or transfers ?  ?Equipment Recommendations ? Other (comment) (pt owns DME)  ?  ?Recommendations for Other Services   ? ? ?  ?Precautions / Restrictions Precautions ?Precautions: Fall ?Precaution Comments: L knee pain  ?  ? ?Mobility ? Bed Mobility ?Overal bed mobility: Modified Independent ?Bed Mobility: Sidelying to Sit ?  ?Sidelying to sit: Supervision ?  ?  ?  ?General bed mobility  comments: increased time ; use of rails ?  ? ?Transfers ?  ?  ?  ?  ?  ?  ?  ?  ?  ?  ?  ? ?Ambulation/Gait ?  ?  ?  ?  ?  ?  ?  ?  ? ? ?Stairs ?  ?  ?  ?  ?  ? ? ?Wheelchair Mobility ?  ? ?Modified Rankin (Stroke Patients Only) ?  ? ? ?  ?Balance Overall balance assessment: Needs assistance ?Sitting-balance support: No upper extremity supported, Feet supported ?Sitting balance-Leahy Scale: Good ?Sitting balance - Comments: EOB sitting 10 mins at least; stable ?  ?  ?  ?Standing balance comment: did not attempt ?  ?  ?  ?  ?  ?  ?  ?  ?  ?  ?  ?  ? ?  ?Cognition Arousal/Alertness: Awake/alert ?Behavior During Therapy: Flat affect ?Overall Cognitive Status: Within Functional Limits for tasks assessed ?  ?  ?  ?  ?  ?  ?  ?  ?  ?  ?  ?  ?  ?  ?  ?  ?General Comments: Pt following basic commands.  He does have flat affect but expected wtih medical decisions he is facing. ?  ?  ? ?  ?Exercises General Exercises - Lower Extremity ?Ankle Circles/Pumps: AROM, Both, 10 reps, Seated ?Long Arc Quad: AROM, Both, 5 reps, Seated (as tolearated on L) ? ?  ?General Comments General comments (skin integrity, edema, etc.): Pt agreeable to therapy at EOB and possibly standing.  Transitioned to EOB, performed some exercises, phone rang and pt talking to someone/family/friend about his medical condition and decisions  facing.  Pt with in depth and personal conversation - PT did not want to interrupt.  Pt reports he is ok sitting EOB and PT departed. ?  ?  ? ?Pertinent Vitals/Pain Pain Assessment ?Pain Assessment: Faces ?Faces Pain Scale: Hurts little more ?Pain Location: head and L knee ?Pain Descriptors / Indicators: Throbbing, Discomfort ?Pain Intervention(s): Limited activity within patient's tolerance, Monitored during session  ? ? ?Home Living   ?  ?  ?  ?  ?  ?  ?  ?  ?  ?   ?  ?Prior Function    ?  ?  ?   ? ?PT Goals (current goals can now be found in the care plan section) Progress towards PT goals: Progressing toward goals ? ?   ?Frequency ? ? ? Min 2X/week ? ? ? ?  ?PT Plan Current plan remains appropriate  ? ? ?Co-evaluation   ?  ?  ?  ?  ? ?  ?AM-PAC PT "6 Clicks" Mobility   ?Outcome Measure ? Help needed turning from your back to your side while in a flat bed without using bedrails?: None ?Help needed moving from lying on your back to sitting on the side of a flat bed without using bedrails?: None ?Help needed moving to and from a bed to a chair (including a wheelchair)?: A Little ?Help needed standing up from a chair using your arms (e.g., wheelchair or bedside chair)?: A Little ?Help needed to walk in hospital room?: Total ?Help needed climbing 3-5 steps with a railing? : Total ?6 Click Score: 16 ? ?  ?End of Session   ?Activity Tolerance: Other (comment) (limited by phone conversation) ?Patient left: in bed;with call bell/phone within reach ?Nurse Communication: Mobility status (pt sitting EOB) ?PT Visit Diagnosis: Muscle weakness (generalized) (M62.81);Pain;Difficulty in walking, not elsewhere classified (R26.2) ?  ? ? ?Time: 1440-1500 ?PT Time Calculation (min) (ACUTE ONLY): 20 min ? ?Charges:  $Therapeutic Activity: 8-22 mins          ?          ? ?Abran Richard, PT ?Acute Rehab Services ?Pager [phone] ?[name] [phone] ? ? ? ?[name] ?04/27/2021, 4:30 PM ? ?

## 2021-04-28 DIAGNOSIS — R45851 Suicidal ideations: Secondary | ICD-10-CM

## 2021-04-28 DIAGNOSIS — I208 Other forms of angina pectoris: Secondary | ICD-10-CM | POA: Diagnosis not present

## 2021-04-28 DIAGNOSIS — T83098A Other mechanical complication of other indwelling urethral catheter, initial encounter: Secondary | ICD-10-CM

## 2021-04-28 DIAGNOSIS — N186 End stage renal disease: Secondary | ICD-10-CM | POA: Diagnosis not present

## 2021-04-28 DIAGNOSIS — I350 Nonrheumatic aortic (valve) stenosis: Secondary | ICD-10-CM | POA: Diagnosis not present

## 2021-04-28 LAB — CBC WITH DIFFERENTIAL/PLATELET
Abs Immature Granulocytes: 0.01 10*3/uL (ref 0.00–0.07)
Basophils Absolute: 0 10*3/uL (ref 0.0–0.1)
Basophils Relative: 0 %
Eosinophils Absolute: 0.2 10*3/uL (ref 0.0–0.5)
Eosinophils Relative: 5 %
HCT: 35.2 % — ABNORMAL LOW (ref 39.0–52.0)
Hemoglobin: 11 g/dL — ABNORMAL LOW (ref 13.0–17.0)
Immature Granulocytes: 0 %
Lymphocytes Relative: 17 %
Lymphs Abs: 0.8 10*3/uL (ref 0.7–4.0)
MCH: 30.4 pg (ref 26.0–34.0)
MCHC: 31.3 g/dL (ref 30.0–36.0)
MCV: 97.2 fL (ref 80.0–100.0)
Monocytes Absolute: 0.4 10*3/uL (ref 0.1–1.0)
Monocytes Relative: 9 %
Neutro Abs: 3.2 10*3/uL (ref 1.7–7.7)
Neutrophils Relative %: 69 %
Platelets: 98 10*3/uL — ABNORMAL LOW (ref 150–400)
RBC: 3.62 MIL/uL — ABNORMAL LOW (ref 4.22–5.81)
RDW: 14.7 % (ref 11.5–15.5)
WBC: 4.8 10*3/uL (ref 4.0–10.5)
nRBC: 0 % (ref 0.0–0.2)

## 2021-04-28 LAB — CBC
HCT: 36 % — ABNORMAL LOW (ref 39.0–52.0)
Hemoglobin: 11.2 g/dL — ABNORMAL LOW (ref 13.0–17.0)
MCH: 30.1 pg (ref 26.0–34.0)
MCHC: 31.1 g/dL (ref 30.0–36.0)
MCV: 96.8 fL (ref 80.0–100.0)
Platelets: 103 10*3/uL — ABNORMAL LOW (ref 150–400)
RBC: 3.72 MIL/uL — ABNORMAL LOW (ref 4.22–5.81)
RDW: 14.9 % (ref 11.5–15.5)
WBC: 5.3 10*3/uL (ref 4.0–10.5)
nRBC: 0 % (ref 0.0–0.2)

## 2021-04-28 LAB — RENAL FUNCTION PANEL
Albumin: 3.1 g/dL — ABNORMAL LOW (ref 3.5–5.0)
Albumin: 3.3 g/dL — ABNORMAL LOW (ref 3.5–5.0)
Anion gap: 8 (ref 5–15)
Anion gap: 12 (ref 5–15)
BUN: 37 mg/dL — ABNORMAL HIGH (ref 6–20)
BUN: 43 mg/dL — ABNORMAL HIGH (ref 6–20)
CO2: 28 mmol/L (ref 22–32)
CO2: 26 mmol/L (ref 22–32)
Calcium: 8.6 mg/dL — ABNORMAL LOW (ref 8.9–10.3)
Calcium: 8.7 mg/dL — ABNORMAL LOW (ref 8.9–10.3)
Chloride: 99 mmol/L (ref 98–111)
Chloride: 97 mmol/L — ABNORMAL LOW (ref 98–111)
Creatinine, Ser: 7.48 mg/dL — ABNORMAL HIGH (ref 0.61–1.24)
Creatinine, Ser: 8.33 mg/dL — ABNORMAL HIGH (ref 0.61–1.24)
GFR, Estimated: 8 mL/min — ABNORMAL LOW (ref >60)
GFR, Estimated: 7 mL/min — ABNORMAL LOW (ref >60)
Glucose, Bld: 126 mg/dL — ABNORMAL HIGH (ref 70–99)
Glucose, Bld: 129 mg/dL — ABNORMAL HIGH (ref 70–99)
Phosphorus: 4.4 mg/dL (ref 2.5–4.6)
Phosphorus: 4.2 mg/dL (ref 2.5–4.6)
Potassium: 4.6 mmol/L (ref 3.5–5.1)
Potassium: 4.4 mmol/L (ref 3.5–5.1)
Sodium: 135 mmol/L (ref 135–145)
Sodium: 135 mmol/L (ref 135–145)

## 2021-04-28 LAB — HEPATITIS B SURFACE ANTIBODY, QUANTITATIVE: Hep B S AB Quant (Post): 165.1 m[IU]/mL (ref >9.9)

## 2021-04-28 LAB — MAGNESIUM: Magnesium: 2.1 mg/dL (ref 1.7–2.4)

## 2021-04-28 MED ORDER — LIDOCAINE-PRILOCAINE 2.5-2.5 % EX CREA
1.0000 "application " | TOPICAL_CREAM | CUTANEOUS | Status: DC | PRN
  Filled 2021-04-28: qty 5

## 2021-04-28 MED ORDER — TRAZODONE HCL 50 MG PO TABS
150.0000 mg | ORAL_TABLET | Freq: Every evening | ORAL | Status: DC | PRN
  Administered 2021-04-28 – 2021-04-30 (×3): 150 mg via ORAL
  Filled 2021-04-28 (×3): qty 3

## 2021-04-28 MED ORDER — SODIUM CHLORIDE 0.9 % IV SOLN: 100.0000 mL | INTRAVENOUS | Status: DC | PRN

## 2021-04-28 MED ORDER — ALTEPLASE 2 MG IJ SOLR
2.0000 mg | Freq: Once | INTRAMUSCULAR | Status: AC | PRN
Start: 2021-04-28 — End: 2021-04-29
  Administered 2021-04-29: 2 mg
  Filled 2021-04-28: qty 2

## 2021-04-28 MED ORDER — HEPARIN SODIUM (PORCINE) 1000 UNIT/ML DIALYSIS: 20.0000 [IU]/kg | INTRAMUSCULAR | Status: DC | PRN

## 2021-04-28 MED ORDER — LORAZEPAM 2 MG/ML IJ SOLN
1.0000 mg | Freq: Once | INTRAMUSCULAR | Status: AC
  Administered 2021-04-28: 1 mg via INTRAVENOUS
  Filled 2021-04-28: qty 1

## 2021-04-28 MED ORDER — LIDOCAINE HCL (PF) 1 % IJ SOLN: 5.0000 mL | INTRAMUSCULAR | Status: DC | PRN

## 2021-04-28 MED ORDER — HEPARIN SODIUM (PORCINE) 1000 UNIT/ML DIALYSIS: 1000.0000 [IU] | INTRAMUSCULAR | Status: DC | PRN

## 2021-04-28 MED ORDER — HEPARIN SODIUM (PORCINE) 1000 UNIT/ML DIALYSIS
20.0000 [IU]/kg | INTRAMUSCULAR | Status: DC | PRN
  Filled 2021-04-28: qty 3

## 2021-04-28 MED ORDER — PENTAFLUOROPROP-TETRAFLUOROETH EX AERO: 1.0000 "application " | INHALATION_SPRAY | CUTANEOUS | Status: DC | PRN

## 2021-04-28 MED ORDER — PROSOURCE PLUS PO LIQD
30.0000 mL | Freq: Two times a day (BID) | ORAL | Status: DC
  Administered 2021-04-29: 30 mL via ORAL
  Filled 2021-04-28 (×3): qty 30

## 2021-04-28 MED ORDER — LORAZEPAM 1 MG PO TABS
1.0000 mg | ORAL_TABLET | Freq: Four times a day (QID) | ORAL | Status: DC | PRN
  Administered 2021-04-28: 1 mg via ORAL
  Filled 2021-04-28: qty 1

## 2021-04-28 NOTE — Progress Notes (Signed)
Pt receives out-pt HD at [facility] on MWF. Pt arrives at 5:50 for 6:10 chair time. Will assist as needed.  ? ?Melven Sartorius ?Renal Navigator ?[phone] ?

## 2021-04-28 NOTE — Progress Notes (Signed)
?                                                   ?Daily Progress Note  ? ?Patient Name: [name]       Date: 04/28/2021 ?DOB: 1965/11/21  Age: 56 y.o. MRN#: [id number] ?Attending Physician: Dwyane Dee, MD ?Primary Care Physician: [name], MD ?Admit Date: 04/22/2021 ? ?Reason for Consultation/Follow-up: Establishing goals of care ? ?Subjective: ?Medical records reviewed. Patient assessed at the bedside. He is feeling anxious about upcoming decision between pursuing hospice or SNF with palliative. ? ?Created space and opportunity for patient's thoughts and feelings on his current illness. He is not afraid of death, but rather afraid of making a wrong choice without considering all available information. His priority is his quality of life. Assisted patient with creating a visual list of pros and cons for each option. Discussed important considerations including his preference for certainty over uncertainty, desire to avoid the potential disappointment if he does not tolerate PT, long history of serious illness with overall feeling of exhaustion. Therapeutic listening and emotional support was provided as [name] shared his poor experience in the ED, difficulty with his family and home environment, and experiences at dialysis with many friends recently dying.  ? ?[name] would like to get a good night of rest before dialysis tomorrow to focus on how his body responds and his emotional and spiritual health throughout the day. He is worried about not having time to then reflect on his choice afterwards. Reassurance was provided. Reviewed the referral process for residential hospice and SNF. Recommended requesting his PRN dose of melatonin this evening. ? ?Questions and concerns addressed. PMT will continue to support holistically.  ? ?Length of Stay: 3 ? ?Current Medications: ?Scheduled Meds:  ? (feeding  supplement) PROSource Plus  30 mL Oral BID BM  ? acetaminophen  325 mg Oral Q6H  ? aspirin EC  81 mg Oral Daily  ? calcium acetate  2,668 mg Oral TID WC  ? Chlorhexidine Gluconate Cloth  6 each Topical Q0600  ? cinacalcet  30 mg Oral Q M,W,F-HD  ? clopidogrel  75 mg Oral Daily  ? doxercalciferol  4 mcg Intravenous Q M,W,F-HD  ? escitalopram  5 mg Oral Daily  ? famotidine  40 mg Oral Daily  ? furosemide  80 mg Oral Daily  ? heparin  5,000 Units Subcutaneous Q8H  ? levothyroxine  137 mcg Oral QAC breakfast  ? lidocaine  1 patch Transdermal Q24H  ? midodrine  10 mg Oral TID WC  ? polyethylene glycol  17 g Oral Daily  ? rosuvastatin  10 mg Oral QHS  ? zolpidem  10 mg Oral QHS  ? ? ?Continuous Infusions: ? sodium chloride    ? sodium chloride    ? ? ?PRN Meds: ?sodium chloride, sodium chloride, acetaminophen, alteplase, calcium acetate, fluticasone, heparin, heparin, [START ON 04/29/2021] heparin, lidocaine (PF), lidocaine-prilocaine, LORazepam, melatonin, ondansetron, oxyCODONE-acetaminophen, pentafluoroprop-tetrafluoroeth, promethazine, traZODone ? ?Physical Exam ?Vitals and nursing note reviewed.  ?Constitutional:   ?   General: He is not in acute distress. ?   Appearance: He is ill-appearing.  ?   Interventions: Nasal cannula in place.  ?Cardiovascular:  ?   Rate and Rhythm: Bradycardia present.  ?Pulmonary:  ?   Effort: Pulmonary effort is normal.  ?Skin: ?   General: Skin  is warm and dry.  ?Neurological:  ?   Mental Status: He is alert and oriented to person, place, and time.  ?Psychiatric:     ?   Thought Content: Thought content normal.     ?   Judgment: Judgment normal.  ?         ? ?Vital Signs: BP (!) 92/55 (BP Location: Right Arm)   Pulse (!) 55   Temp 98.3 ?F (36.8 ?C) (Oral)   Resp 16   Ht 5\' 11"  (1.803 m)   Wt 117.8 kg   SpO2 99%   BMI 36.22 kg/m?  ?SpO2: SpO2: 99 % ?O2 Device: O2 Device: Nasal Cannula ?O2 Flow Rate: O2 Flow Rate (L/min): 2 L/min ? ?Intake/output summary: No intake or output data in  the 24 hours ending 04/28/21 1718 ?LBM:   ?Baseline Weight: Weight: 123 kg ?Most recent weight: Weight: 117.8 kg ? ?     ?Palliative Assessment/Data: 40% at best ? ? ? ? ? ?Patient Active Problem List  ? Diagnosis Date Noted  ? Goals of care, counseling/discussion   ? 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 Abscess of left thigh 01/28/2020  ? Coronary artery calcification   ? Angina at rest [facility])   ? Complication of vascular access for dialysis   ? Malfunction of nephrostomy tube ([place])   ? Complication of nephrostomy ([place]) 03/20/2019  ? Abnormal CT scan, gastrointestinal tract   ? Hydronephrosis with renal and ureteral calculus obstruction 11/26/2018  ? Mild protein-calorie malnutrition ([place]) 04/24/2018  ? Class 2 severe obesity due to excess calories with serious comorbidity and body mass index (BMI) of 37.0 to 37.9 in adult [facility]) 02/26/2018  ? PAD (peripheral artery disease) ([place]) 12/22/2017  ? Anxiety 12/21/2017  ? Left great toe amputee ([place]) 12/19/2017  ? Endocarditis of mitral valve   ? ESRD (end stage renal disease) on dialysis ([place]) 08/07/2017  ? Aortic stenosis 02/26/2017  ? Nonhealing surgical wound 02/29/2016  ? Epilepsy ([place]) 12/30/2012  ? Complication of AV dialysis fistula 06/27/2012  ? End stage renal disease ([place]) 06/27/2012  ? Personal history of Methicillin resistant Staphylococcus aureus infection 11/17/2008  ? Anemia in chronic kidney disease 10/01/2007  ? Essential (primary) hypertension 10/01/2007  ? ? ?Palliative Care Assessment & Plan  ? ?Patient Profile: ?[name] is a 56 y.o. male with medical history significant of severe aortic stenosis, ESRD on HD, recurrent kidney stone, spinal bifida, neurogenic bladder status post nephrostomy, recurrent UTI, CAD, hypothyroidism, chronic pain and narcotic dependence, presented with anginal-like chest pain and near syncope's. ? ?PMT has been consulted to assist with goals of care conversation. ? ?Assessment: ?ESRD on HD ?Moderately  severe to severe AS ?Accelerating angina ?Goals of care conversation ? ? ?Recommendations/Plan: ?DNR ?Continue current care  ?Psychosocial and emotional support provided ?Patient wishes to decide on goals of care after HD tomorrow, will meet again ?PMT will continue to follow ? ?Prognosis: ? Poor long-term prognosis given worsening AS and not a candidate for TAVR, ESRD considering discontinuation of HD ? ?Discharge Planning: ?To Be Determined ? ?Care plan was discussed with Patient, Chaplain Sallyanne Kuster  ? ?Total time: ?I spent 90 minutes in the care of the patient today in the above activities and documenting the encounter. ? ? ?Dorthy Cooler, PA-C ?Palliative Medicine Team ?Team phone # [phone] ? ?Thank you for allowing the Palliative Medicine Team to assist in the care of this patient. Please utilize secure chat with additional questions,  if there is no response within 30 minutes please call the above phone number. ? ?Palliative Medicine Team providers are available by phone from 7am to 7pm daily and can be reached through the team cell phone.  ?Should this patient require assistance outside of these hours, please call the patient's attending physician.  ? ? ? ?

## 2021-04-28 NOTE — Progress Notes (Addendum)
?[name] ?Progress Note  ? ?Subjective:  Seen in room - didn't sleep well last night. Still with CP with deep breathing. No dyspnea at the moment. HD yesterday went ok, 1.1L removed. Still wrestling with decisions on hospice or palliative care - reiterated that he does not have to make a decision today. As long as we can find a safe place for discharge, he can continue HD. He wants to speak to palliative care again as well as a chaplin today -> will message his team to see if can arrange this. ? ?Objective ?Vitals:  ? 04/28/21 0000 04/28/21 0400 04/28/21 0534 04/28/21 0802  ?BP: 104/64 (!) 83/52 (!) 93/59 (!) 83/51  ?Pulse: (!) 53 (!) 44  (!) 51  ?Resp: 17 15  18   ?Temp:  99.2 ?F (37.3 ?C)  98.2 ?F (36.8 ?C)  ?TempSrc:  Oral  Oral  ?SpO2: 100% 98%  100%  ?Weight:      ?Height:      ? ?Physical Exam ?General: Chronically ill appearing man, NAD. ?Heart: Mild bradycardia, 3/6 murmur ?Lungs: Coarse expiration, no wheezing or rales ?Abdomen: soft ?Extremities: No LE edema ?Dialysis Access: thigh TDC ? ?Additional Objective ?Labs: ?Basic Metabolic Panel: ?Recent Labs  ?Lab 04/22/21 ?1437 04/27/21 ?0128 04/28/21 ?0134  ?NA 138 136 135  ?K 3.6 5.2* 4.6  ?CL 97* 99 99  ?CO2 26 29 28   ?GLUCOSE 78 82 126*  ?BUN 8 44* 37*  ?CREATININE 4.68* 9.52* 7.48*  ?CALCIUM 8.6* 8.6* 8.6*  ?PHOS  --  5.1* 4.4  ? ?Liver Function Tests: ?Recent Labs  ?Lab 04/22/21 ?1437 04/27/21 ?0128 04/28/21 ?0134  ?AST 16  --   --   ?ALT 7  --   --   ?ALKPHOS 171*  --   --   ?BILITOT 1.1  --   --   ?PROT 8.7*  --   --   ?ALBUMIN 4.0 3.3* 3.1*  ? ?CBC: ?Recent Labs  ?Lab 04/22/21 ?1437 04/27/21 ?0128 04/28/21 ?0134  ?WBC 5.3 4.4 4.8  ?NEUTROABS 4.2 3.0 3.2  ?HGB 12.6* 10.9* 11.0*  ?HCT 40.5 35.4* 35.2*  ?MCV 99.0 98.1 97.2  ?PLT PLATELET CLUMPS NOTED ON SMEAR, UNABLE TO ESTIMATE 99* 98*  ? ?Studies/Results: ?ECHOCARDIOGRAM COMPLETE ? ?Result Date: 04/26/2021 ?   ECHOCARDIOGRAM REPORT   Patient Name:   [name] Date of Exam:  04/26/2021 Medical Rec #:  [id number]        Height:       71.0 in Accession #:    [id number]       Weight:       260.1 lb Date of Birth:  [birth year]       BSA:          2.358 m? Patient Age:    56 years         BP:           102/61 mmHg Patient Gender: M                HR:           55 bpm. Exam Location:  Inpatient Procedure: 2D Echo, Cardiac Doppler, Color Doppler and Strain Analysis Indications:    AS  History:        Patient has prior history of Echocardiogram examinations, most                 recent 10/05/2019. Signs/Symptoms:Murmur; Risk  Factors:Hypertension.  Sonographer:    Luisa Hart RDCS Referring Phys: 94 Stepan COOPER  Sonographer Comments: Technically difficult study due to poor echo windows. IMPRESSIONS  1. Left ventricular ejection fraction, by estimation, is 60 to 65%. The left ventricle has normal function. The left ventricle has no regional wall motion abnormalities. There is mild concentric left ventricular hypertrophy. Left ventricular diastolic parameters are consistent with Grade III diastolic dysfunction (restrictive).  2. Right ventricular systolic function is normal. The right ventricular size is normal. Tricuspid regurgitation signal is inadequate for assessing PA pressure.  3. Left atrial size was mildly dilated.  4. A small pericardial effusion is present. The pericardial effusion is anterior to the right ventricle.  5. The mitral valve is abnormal. No evidence of mitral valve regurgitation. Mild mitral stenosis. The mean mitral valve gradient is 5.0 mmHg with average heart rate of 55 bpm. Moderate mitral annular calcification.  6. The aortic valve was not well visualized. Aortic valve regurgitation is mild. Aortic valve mean gradient measures 35.0 mmHg. Normal LV Stroke Volume Index. DVI 0.29. AVA 1.2 by Continuity equation. Moderate to severe aortic stenosis. Comparison(s): Similar aortic valve gradients from 2021 study. FINDINGS  Left Ventricle: Left ventricular  ejection fraction, by estimation, is 60 to 65%. The left ventricle has normal function. The left ventricle has no regional wall motion abnormalities. The left ventricular internal cavity size was normal in size. There is  mild concentric left ventricular hypertrophy. Left ventricular diastolic parameters are consistent with Grade III diastolic dysfunction (restrictive). Right Ventricle: The right ventricular size is normal. No increase in right ventricular wall thickness. Right ventricular systolic function is normal. Tricuspid regurgitation signal is inadequate for assessing PA pressure. Left Atrium: Left atrial size was mildly dilated. Right Atrium: Right atrial size was normal in size. Pericardium: A small pericardial effusion is present. The pericardial effusion is anterior to the right ventricle. Presence of epicardial fat layer. Mitral Valve: The mitral valve is abnormal. Moderate mitral annular calcification. No evidence of mitral valve regurgitation. Mild mitral valve stenosis. MV peak gradient, 15.1 mmHg. The mean mitral valve gradient is 5.0 mmHg with average heart rate of 55 bpm. Tricuspid Valve: The tricuspid valve is normal in structure. Tricuspid valve regurgitation is not demonstrated. No evidence of tricuspid stenosis. Aortic Valve: The aortic valve was not well visualized. Aortic valve regurgitation is mild. Aortic regurgitation PHT measures 648 msec. Aortic valve mean gradient measures 35.0 mmHg. Aortic valve peak gradient measures 58.8 mmHg. Aortic valve area, by VTI measures 1.62 cm?. Pulmonic Valve: The pulmonic valve was not well visualized. Pulmonic valve regurgitation is not visualized. No evidence of pulmonic stenosis. Aorta: The aortic root and ascending aorta are structurally normal, with no evidence of dilitation. IAS/Shunts: No atrial level shunt detected by color flow Doppler.  LEFT VENTRICLE PLAX 2D LVIDd:         5.50 cm     Diastology LVIDs:         2.70 cm     LV e' medial:    3.94  cm/s LV PW:         1.30 cm     LV E/e' medial:  45.9 LV IVS:        1.20 cm     LV e' lateral:   5.18 cm/s LVOT diam:     2.70 cm     LV E/e' lateral: 34.9 LV SV:         140 LV SV Index:   59  2D Longitudinal Strain LVOT Area:     5.73 cm?    2D Strain GLS Avg:     -20.0 %  LV Volumes (MOD) LV vol d, MOD A2C: 45.0 ml LV vol d, MOD A4C: 75.0 ml LV vol s, MOD A2C: 15.2 ml LV vol s, MOD A4C: 20.1 ml LV SV MOD A2C:     29.8 ml LV SV MOD A4C:     75.0 ml LV SV MOD BP:      42.0 ml RIGHT VENTRICLE RV Basal diam:  3.70 cm RV Mid diam:    2.00 cm TAPSE (M-mode): 1.6 cm LEFT ATRIUM              Index        RIGHT ATRIUM           Index LA Vol (A2C):   108.0 ml 45.80 ml/m?  RA Area:     11.10 cm? LA Vol (A4C):   73.4 ml  31.13 ml/m?  RA Volume:   21.20 ml  8.99 ml/m? LA Biplane Vol: 91.3 ml  38.72 ml/m?  AORTIC VALVE                     PULMONIC VALVE AV Area (Vmax):    1.72 cm?      PV Vmax:       1.01 m/s AV Area (Vmean):   1.69 cm?      PV Vmean:      69.400 cm/s AV Area (VTI):     1.62 cm?      PV VTI:        0.247 m AV Vmax:           383.25 cm/s   PV Peak grad:  4.1 mmHg AV Vmean:          259.250 cm/s  PV Mean grad:  2.0 mmHg AV VTI:            0.862 m AV Peak Grad:      58.8 mmHg AV Mean Grad:      35.0 mmHg LVOT Vmax:         115.00 cm/s LVOT Vmean:        76.400 cm/s LVOT VTI:          0.244 m LVOT/AV VTI ratio: 0.28 AI PHT:            648 msec  AORTA Ao Root diam: 3.30 cm Ao Asc diam:  3.30 cm MITRAL VALVE MV Area (PHT): 2.62 cm?     SHUNTS MV Area VTI:   1.94 cm?     Systemic VTI:  0.24 m MV Peak grad:  15.1 mmHg    Systemic Diam: 2.70 cm MV Mean grad:  5.0 mmHg MV Vmax:       1.94 m/s MV Vmean:      102.0 cm/s MV Decel Time: 289 msec MV E velocity: 181.00 cm/s MV A velocity: 68.30 cm/s MV E/A ratio:  2.65 Rudean Haskell MD Electronically signed by Rudean Haskell MD Signature Date/Time: 04/26/2021/2:37:29 PM    Final    ? ?Medications: ? ? acetaminophen  325 mg Oral Q6H  ? aspirin EC  81 mg  Oral Daily  ? calcium acetate  2,668 mg Oral TID WC  ? Chlorhexidine Gluconate Cloth  6 each Topical Q0600  ? cinacalcet  30 mg Oral Q M,W,F-HD  ? clopidogrel  75 mg Oral Daily  ? doxercalciferol  4 mcg Jaymes Graff

## 2021-04-28 NOTE — TOC Initial Note (Signed)
Transition of Care [facility]) - Initial/Assessment Note    Patient Details  Name: [name] MRN: [id number] Date of Birth: 1965-01-24  Transition of Care [facility]) CM/SW Contact:    Mearl Latin, LCSW Phone Number: 04/28/2021, 2:10 PM  Clinical Narrative:                 CSW met with patient and provided the two SNF bed offers available that would be able to transport him to dialysis [facility] and [facility]). He expressed frustration with the ratings for these options and stated that may affect his decision with whether or not to move forward with dialysis. CSW explained that he would meet with palliative again tomorrow to further explore his feelings and needs but that CSW wanted him to know what the options would be were he to decide on dialysis and rehab at [facility]. CSW inquired as to what he meant by wanting to go to [facility]" as reported by the prior social worker in the ED. He stated it was a motel he was looking into but was hoping to find a different one. CSW will continue to follow.     Skilled Nursing Rehab Facilities-   [email or web address]   Ratings out of 5 possible   Name Address  Phone # Quality Care Staffing Health Inspection Overall  [facility] [address] [place] [phone] 4 5 2 3   [facility]  [address], [place] [phone] 3 2 5 5   [facility] [address] [place], [place] [phone] 3 1 1 1   [facility] [address] [phone] 3 2 4 4   [facility] [address], [place] [phone] 1 1 2 1   [facility] [phone] N[address] [place] [phone] 2 1 4 3   [facility] [address] [place] [phone] 5 2 3 4   [facility] [address], [place] [phone] 5 2 2 3   [address] ([place]) [address] [place] [phone] 5 1 2 2   [facility] [address], Ginette Otto [phone] 4 1 2 1    [facility] [address] [facility] [phone] 4 1 2 1   [facility] [address], [place] [phone] 4 1 1 1   [facility] [address] [place]iane Shi [phone] 3 2 4 4           [facility] [address] [place] [phone]      [facility] [address] [place] [phone] 4 2 3 3   [facility] [address] Cheree Ditto [phone] [address], [place] [address], [place] [phone] 2 1 1 1   [facility] [address] [employer] [phone] 2 1 3 2           [address] (no [facility]) [address], [place] [phone] 4 5 5 5   [facility] (No [employer]) [address], [place] [phone] 3 1 4 3   [facility] (No [facility]) [address], [place] [phone] 5 5 5 5   [facility] [address] [employer] [phone] 3 2 4 4   [facility] [address], [place] [phone] 1 1 2 1   [facility] [address], [place] [phone] 2 1 1 1   [facility] [address] [place] [phone] 5 2 4 5   [facility] [address], [place] [phone] 3 1 1 1   [facility] [address] [place], [place] [phone]  2 1 2 1           [facility] [address], [place] [phone] 1 1 1 1   [facility] [address], Evlyn Clines  [phone] 2 4 2 2   [facility] [address], [place] Levan [phone] 5 2 3 4   [facility] [address] [place] [phone] 2 1 1 1   [facility] (No Humana) [address], [place] [phone] 2 1 3 2   [facility] [address] Rosalita Levan [phone] 3 1 1 1           [facility] [address] [place], [place] [phone] 5 4 5 5   [facility])  [address], [place] [phone] 2 2 3 3   [facility]) [address] [place] [phone] 3 2 4 4   [facility] [address], [place] [phone] 4 3 4 4   [address] [place], [place] [phone] 3 3 1 1   [facility]) [address] [place] [phone] 2 2 4 4        Barriers to Discharge: Continued Medical Work up   Patient Goals and CMS Choice Patient states their goals for this hospitalization and ongoing recovery are:: Unsure at this time CMS Medicare.gov Compare Post Acute Care list provided to:: Patient Choice offered to / list presented to : Patient  Expected Discharge Plan and Services   In-house Referral: Clinical Social Work     Living arrangements for the past 2 months: Single Family Home, Hotel/Motel                                      Prior Living Arrangements/Services Living arrangements for the past 2 months: Single Family Home, Hotel/Motel Lives with:: Self Patient language and need for interpreter reviewed:: Yes Do you feel safe going back to the place where you live?: Yes      Need for Family Participation in Patient Care: Yes (Comment) Care giver support system in place?: Yes (comment)   Criminal Activity/Legal Involvement Pertinent to Current Situation/Hospitalization: No - Comment as needed  Activities of Daily Living Home Assistive Devices/Equipment: None ADL Screening (condition at time of admission) Patient's cognitive ability adequate to safely complete daily activities?: Yes Is the patient deaf or have difficulty hearing?: No Does the patient have difficulty seeing, even when wearing glasses/contacts?: No Does the patient have difficulty concentrating, remembering, or making decisions?: No Patient able to express need for assistance with ADLs?: Yes Does the patient have difficulty dressing or bathing?: No Independently performs ADLs?: Yes (appropriate for developmental age) Does the patient have difficulty walking or climbing stairs?: Yes Weakness of Legs: Both Weakness of Arms/Hands: None  Permission  Sought/Granted Permission sought to share information with : Oceanographer granted to share information with : Yes, Verbal Permission Granted     Permission granted to share info w AGENCY: SNFs        Emotional Assessment Appearance:: Appears stated age Attitude/Demeanor/Rapport: Guarded Affect (typically observed): Appropriate, Blunt, Guarded, Depressed Orientation: : Oriented to Self, Oriented to Place, Oriented to  Time, Oriented to Situation Alcohol / Substance Use: Not Applicable Psych Involvement: No (comment)  Admission diagnosis:  End stage renal disease (HCC) [N18.6] Suicidal ideation [R45.851] Chronic pain syndrome [G89.4] Angina at rest [facility]) [I20.8] Abnormal chest x-ray [R93.89] Chest pain, unspecified type [R07.9] Aortic valve stenosis, etiology  of cardiac valve disease unspecified [I35.0] Patient Active Problem List   Diagnosis Date Noted   Goals of care, counseling/discussion    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    Abscess of left thigh 01/28/2020   Coronary artery calcification    Angina at rest [facility])    Complication of vascular access for dialysis    Malfunction of nephrostomy tube [facility])    Complication of nephrostomy (HCC) 03/20/2019   Abnormal CT scan, gastrointestinal tract    Hydronephrosis with renal and ureteral calculus obstruction 11/26/2018   Mild protein-calorie malnutrition (HCC) 04/24/2018   Class 2 severe obesity due to excess calories with serious comorbidity and body mass index (BMI) of 37.0 to 37.9 in adult [facility]) 02/26/2018   PAD (peripheral artery disease) (HCC) 12/22/2017   Anxiety 12/21/2017   Left great toe amputee (HCC) 12/19/2017   Endocarditis of mitral valve    ESRD (end stage renal disease) on dialysis (HCC) 08/07/2017   Aortic stenosis 02/26/2017   Nonhealing surgical wound 02/29/2016   Epilepsy (HCC) 12/30/2012   Complication of AV dialysis fistula 06/27/2012   End stage renal disease  (HCC) 06/27/2012   Personal history of Methicillin resistant Staphylococcus aureus infection 11/17/2008   Anemia in chronic kidney disease 10/01/2007   Essential (primary) hypertension 10/01/2007   PCP:  Tracey Harries, MD Pharmacy:   [id number] [place], [place] - [address] [place] [address] Phone: [phone] Fax: [phone]     Social Determinants of Health (SDOH) Interventions    Readmission Risk Interventions    04/28/2021    2:08 PM 03/11/2021    2:57 PM  Readmission Risk Prevention Plan  Transportation Screening Complete Complete  PCP or Specialist Appt within 3-5 Days  Complete  HRI or Home Care Consult  Complete  Social Work Consult for Recovery Care Planning/Counseling  Complete  Palliative Care Screening  Not Applicable  Medication Review [facility]) Complete Complete  PCP or Specialist appointment within 3-5 days of discharge Complete   HRI or Home Care Consult Complete   SW Recovery Care/Counseling Consult Complete   Palliative Care Screening Complete   Skilled Nursing Facility Complete

## 2021-04-28 NOTE — Care Management Important Message (Signed)
Important Message ? ?Patient Details  ?Name: [name] ?MRN: [id number] ?Date of Birth: 27-Mar-1965 ? ? ?Medicare Important Message Given:  Yes ? ? ? ? ?Woods Gangemi ?04/28/2021, 4:10 PM ?

## 2021-04-28 NOTE — Progress Notes (Signed)
This chaplain responded to PMT consult for spiritual care. The chaplain understands the Pt. is requesting completion of an Advance Directive and a [place] priest visit. ? ?The Pt. is awake and open to conversation with the chaplain. The Pt. is appreciative of the PMT involvement in his care and looking forward to F/U conversation with PMT.  ? ?The chaplain understands through reflective listening the Pt. is gathering information "risks vs benefits" to inform his possible decision of Hospice care. The Pt. is able to reflect on his personal journey with HD and shares "he no longer feels better after HD" and is hoping for a "stress test to help him determine the next steps."  The chaplain understands the Pt. is hopeful a decision will give the Pt. "a day or two of rest before making a transition." ? ?The Pt. completed AD education with the chaplain with opportunities for the Pt. to ask clarifying questions. The Pt. AD: HCPOA and Living Will are filled out, placed in the Pt. paper chart, and waiting for a notary on Friday. ? ?The chaplain contacted Father Barnabas Lister for a priest visit. The chaplain understands Father Barnabas Lister will visit today before visiting hours end. ? ?This chaplain is available for F/U spiritual care as needed. ? ?Chaplain Sallyanne Kuster ?[phone] ?

## 2021-04-28 NOTE — Progress Notes (Signed)
?Progress Note ? ? ? ?[name]   ?JOA:[id number]  ?DOB: December 13, 1965  ?DOA: 04/22/2021     3 ?PCP: [name], MD ? ?Initial CC: Chest pain, near syncope ? ?Hospital Course: ?[name] is a 56 y.o. male with medical history significant of severe aortic stenosis, ESRD on HD, recurrent kidney stone, spinal bifida, neurogenic bladder status post nephrostomy, recurrent UTI, CAD, hypothyroidism, chronic pain and narcotic dependence, who presented with anginal-like chest pain and dizziness/lightheadedness. ? ?He endorsed being under significant amount of stress lately due to now being homeless he says. He also has multiple chronic medical issues including difficulty with dialysis over the years which stresses him out also he says. ?He had presented to the ER initially on 04/22/2021 and voiced some suicidal ideations and had been placed under IVC however he states these were situational from his stress and current acute illness of feeling poorly.  After further evaluation and monitoring he was felt to be no further self-harm and had no further ideations. ? ?He was admitted for evaluations with cardiology due to concern for worsening symptomatic aortic stenosis. ? ?Interval History:  ?No events overnight.  More anxious appearing today over trying to decide over stopping dialysis or not.  He is still wishing for more time to consider his options.  He also was asking for something for his anxiety this afternoon which was ordered and for more help with sleeping at night. ? ?Assessment and Plan: ?  ?Severe symptomatic aortic stenosis ?-Greatly appreciate cardiology evaluation.  Patient has been having more progressive symptoms including chest pain and near syncope ?- After cardiology discussion, patient not considered candidate for TAVR due to multiple medical comorbidities, severe PAD, and poor social resources ?-Greatly appreciate and agree with palliative care discussions with patient.  He is now considering  possible discontinuation of dialysis and pursuing hospice.  He plans to think about this further throughout the week and have another meeting with palliative care on Friday. ?-Patient also mentioned possible stress testing.  Clarified this with cardiology and this was not recommended nor considered to be needed.  He can work with physical therapy as tolerated although unfortunately he does get symptoms with minimal exertion. ? ?ESRD on HD ?-Nephrology on board, continue MWF HD. ?- see above discussion ?  ?Suicidal ideation - resolved ?-Resolved, no indication for 1:1 monitoring. ?-Continue current regimen of psychiatric medications ?-Case manager and palliative care also consulted ?  ?Acute headache - resolved  ?-No focal findings on exam ?- CT head also negative for acute abnormalities  ? ? Chronic anemia on CKD ?-H&H stable. ?  ?CAD ?-Continue aspirin Plavix and statin ?  ?Hypothyroidism ?-continue Synthroid. ? ? ? ?Old records reviewed in assessment of this patient ? ?Antimicrobials: ? ? ?DVT prophylaxis:  ?heparin injection 5,000 Units Start: 04/25/21 2200 ? ? ?Code Status:   Code Status: DNR ? ?Disposition Plan:  pending palliative care discussions; next one on 4/28 ?Status is: Inpt ? ?Objective: ?Blood pressure (!) 110/59, pulse (!) 53, temperature 98.2 ?F (36.8 ?C), temperature source Oral, resp. rate 12, height 5\' 11"  (1.803 m), weight 117.8 kg, SpO2 99 %.  ?Examination:  ?Physical Exam ?Constitutional:   ?   Appearance: Normal appearance.  ?   Comments: Chronically ill-appearing disheveled man lying in bed in NAD and appears more comfortable today   ?HENT:  ?   Head: Normocephalic and atraumatic.  ?   Mouth/Throat:  ?   Mouth: Mucous membranes are moist.  ?Eyes:  ?  Extraocular Movements: Extraocular movements intact.  ?Cardiovascular:  ?   Rate and Rhythm: Normal rate and regular rhythm.  ?   Heart sounds: Normal heart sounds.  ?   Comments: 3/6 HSM appreciated throughout precordium ?Pulmonary:  ?    Effort: Pulmonary effort is normal. No respiratory distress.  ?   Breath sounds: Normal breath sounds. No wheezing.  ?Abdominal:  ?   General: Bowel sounds are normal. There is no distension.  ?   Palpations: Abdomen is soft.  ?   Tenderness: There is no abdominal tenderness.  ?Musculoskeletal:     ?   General: Swelling present. Normal range of motion.  ?   Cervical back: Normal range of motion and neck supple.  ?Skin: ?   Comments: Scattered excoriations and old fistulas noted  ?Neurological:  ?   General: No focal deficit present.  ?Psychiatric:     ?   Mood and Affect: Mood normal.     ?   Behavior: Behavior normal.  ?  ? ?Consultants:  ?Cardiology ?Nephrology ?Palliative care ? ?Procedures:  ? ? ?Data Reviewed: ?Results for orders placed or performed during the hospital encounter of 04/22/21 (from the past 24 hour(s))  ?MRSA Next Gen by PCR, Nasal     Status: None  ? Collection Time: 04/27/21  5:05 PM  ? Specimen: Nasal Mucosa; Nasal Swab  ?Result Value Ref Range  ? MRSA by PCR Next Gen NOT DETECTED NOT DETECTED  ?CBC with Differential/Platelet     Status: Abnormal  ? Collection Time: 04/28/21  1:34 AM  ?Result Value Ref Range  ? WBC 4.8 4.0 - 10.5 K/uL  ? RBC 3.62 (L) 4.22 - 5.81 MIL/uL  ? Hemoglobin 11.0 (L) 13.0 - 17.0 g/dL  ? HCT 35.2 (L) 39.0 - 52.0 %  ? MCV 97.2 80.0 - 100.0 fL  ? MCH 30.4 26.0 - 34.0 pg  ? MCHC 31.3 30.0 - 36.0 g/dL  ? RDW 14.7 11.5 - 15.5 %  ? Platelets 98 (L) 150 - 400 K/uL  ? nRBC 0.0 0.0 - 0.2 %  ? Neutrophils Relative % 69 %  ? Neutro Abs 3.2 1.7 - 7.7 K/uL  ? Lymphocytes Relative 17 %  ? Lymphs Abs 0.8 0.7 - 4.0 K/uL  ? Monocytes Relative 9 %  ? Monocytes Absolute 0.4 0.1 - 1.0 K/uL  ? Eosinophils Relative 5 %  ? Eosinophils Absolute 0.2 0.0 - 0.5 K/uL  ? Basophils Relative 0 %  ? Basophils Absolute 0.0 0.0 - 0.1 K/uL  ? Immature Granulocytes 0 %  ? Abs Immature Granulocytes 0.01 0.00 - 0.07 K/uL  ?Magnesium     Status: None  ? Collection Time: 04/28/21  1:34 AM  ?Result Value Ref  Range  ? Magnesium 2.1 1.7 - 2.4 mg/dL  ?Renal function panel     Status: Abnormal  ? Collection Time: 04/28/21  1:34 AM  ?Result Value Ref Range  ? Sodium 135 135 - 145 mmol/L  ? Potassium 4.6 3.5 - 5.1 mmol/L  ? Chloride 99 98 - 111 mmol/L  ? CO2 28 22 - 32 mmol/L  ? Glucose, Bld 126 (H) 70 - 99 mg/dL  ? BUN 37 (H) 6 - 20 mg/dL  ? Creatinine, Ser 7.48 (H) 0.61 - 1.24 mg/dL  ? Calcium 8.6 (L) 8.9 - 10.3 mg/dL  ? Phosphorus 4.4 2.5 - 4.6 mg/dL  ? Albumin 3.1 (L) 3.5 - 5.0 g/dL  ? GFR, Estimated 8 (L) >60 mL/min  ? Anion  gap 8 5 - 15  ?Renal function panel     Status: Abnormal  ? Collection Time: 04/28/21 11:15 AM  ?Result Value Ref Range  ? Sodium 135 135 - 145 mmol/L  ? Potassium 4.4 3.5 - 5.1 mmol/L  ? Chloride 97 (L) 98 - 111 mmol/L  ? CO2 26 22 - 32 mmol/L  ? Glucose, Bld 129 (H) 70 - 99 mg/dL  ? BUN 43 (H) 6 - 20 mg/dL  ? Creatinine, Ser 8.33 (H) 0.61 - 1.24 mg/dL  ? Calcium 8.7 (L) 8.9 - 10.3 mg/dL  ? Phosphorus 4.2 2.5 - 4.6 mg/dL  ? Albumin 3.3 (L) 3.5 - 5.0 g/dL  ? GFR, Estimated 7 (L) >60 mL/min  ? Anion gap 12 5 - 15  ?CBC     Status: Abnormal  ? Collection Time: 04/28/21 11:15 AM  ?Result Value Ref Range  ? WBC 5.3 4.0 - 10.5 K/uL  ? RBC 3.72 (L) 4.22 - 5.81 MIL/uL  ? Hemoglobin 11.2 (L) 13.0 - 17.0 g/dL  ? HCT 36.0 (L) 39.0 - 52.0 %  ? MCV 96.8 80.0 - 100.0 fL  ? MCH 30.1 26.0 - 34.0 pg  ? MCHC 31.1 30.0 - 36.0 g/dL  ? RDW 14.9 11.5 - 15.5 %  ? Platelets 103 (L) 150 - 400 K/uL  ? nRBC 0.0 0.0 - 0.2 %  ?  ?I have Reviewed nursing notes, Vitals, and Lab results since pt's last encounter. Pertinent lab results : see above ?I have ordered test including BMP, CBC, Mg ?I have reviewed the last note from staff over past 24 hours ?I have discussed pt's care plan and test results with nursing staff, case manager ? ? LOS: 3 days  ? ?Dwyane Dee, MD ?[facility] ?04/28/2021, 3:13 PM ? ?

## 2021-04-29 DIAGNOSIS — G894 Chronic pain syndrome: Secondary | ICD-10-CM

## 2021-04-29 DIAGNOSIS — L02416 Cutaneous abscess of left lower limb: Secondary | ICD-10-CM

## 2021-04-29 DIAGNOSIS — I350 Nonrheumatic aortic (valve) stenosis: Secondary | ICD-10-CM | POA: Diagnosis not present

## 2021-04-29 DIAGNOSIS — I208 Other forms of angina pectoris: Secondary | ICD-10-CM | POA: Diagnosis not present

## 2021-04-29 DIAGNOSIS — R45851 Suicidal ideations: Secondary | ICD-10-CM | POA: Diagnosis not present

## 2021-04-29 LAB — CBC WITH DIFFERENTIAL/PLATELET
Abs Immature Granulocytes: 0.02 K/uL (ref 0.00–0.07)
Basophils Absolute: 0 K/uL (ref 0.0–0.1)
Basophils Relative: 0 %
Eosinophils Absolute: 0.3 K/uL (ref 0.0–0.5)
Eosinophils Relative: 5 %
HCT: 35.3 % — ABNORMAL LOW (ref 39.0–52.0)
Hemoglobin: 10.6 g/dL — ABNORMAL LOW (ref 13.0–17.0)
Immature Granulocytes: 0 %
Lymphocytes Relative: 17 %
Lymphs Abs: 0.8 K/uL (ref 0.7–4.0)
MCH: 29.9 pg (ref 26.0–34.0)
MCHC: 30 g/dL (ref 30.0–36.0)
MCV: 99.7 fL (ref 80.0–100.0)
Monocytes Absolute: 0.4 K/uL (ref 0.1–1.0)
Monocytes Relative: 9 %
Neutro Abs: 3.2 K/uL (ref 1.7–7.7)
Neutrophils Relative %: 69 %
Platelets: 94 K/uL — ABNORMAL LOW (ref 150–400)
RBC: 3.54 MIL/uL — ABNORMAL LOW (ref 4.22–5.81)
RDW: 14.7 % (ref 11.5–15.5)
WBC: 4.7 K/uL (ref 4.0–10.5)
nRBC: 0 % (ref 0.0–0.2)

## 2021-04-29 LAB — RENAL FUNCTION PANEL
Albumin: 3.3 g/dL — ABNORMAL LOW (ref 3.5–5.0)
Anion gap: 12 (ref 5–15)
BUN: 54 mg/dL — ABNORMAL HIGH (ref 6–20)
CO2: 24 mmol/L (ref 22–32)
Calcium: 8.9 mg/dL (ref 8.9–10.3)
Chloride: 99 mmol/L (ref 98–111)
Creatinine, Ser: 9.77 mg/dL — ABNORMAL HIGH (ref 0.61–1.24)
GFR, Estimated: 6 mL/min — ABNORMAL LOW
Glucose, Bld: 80 mg/dL (ref 70–99)
Phosphorus: 4.8 mg/dL — ABNORMAL HIGH (ref 2.5–4.6)
Potassium: 5.4 mmol/L — ABNORMAL HIGH (ref 3.5–5.1)
Sodium: 135 mmol/L (ref 135–145)

## 2021-04-29 LAB — MAGNESIUM: Magnesium: 2.2 mg/dL (ref 1.7–2.4)

## 2021-04-29 MED ORDER — NEPRO/CARBSTEADY PO LIQD
237.0000 mL | ORAL | Status: DC | PRN
  Filled 2021-04-29: qty 237

## 2021-04-29 MED ORDER — DIPHENHYDRAMINE HCL 50 MG/ML IJ SOLN
25.0000 mg | Freq: Once | INTRAMUSCULAR | Status: AC
  Administered 2021-04-29: 25 mg via INTRAVENOUS

## 2021-04-29 MED ORDER — LORAZEPAM 2 MG/ML IJ SOLN
2.0000 mg | INTRAMUSCULAR | Status: DC | PRN
  Administered 2021-04-29 – 2021-05-02 (×9): 2 mg via INTRAVENOUS
  Filled 2021-04-29 (×12): qty 1

## 2021-04-29 MED ORDER — FUROSEMIDE 40 MG PO TABS
80.0000 mg | ORAL_TABLET | ORAL | Status: DC
  Administered 2021-04-30 – 2021-05-01 (×2): 80 mg via ORAL
  Filled 2021-04-29 (×2): qty 2

## 2021-04-29 MED ORDER — DIPHENHYDRAMINE HCL 50 MG/ML IJ SOLN: 25.0000 mg | Freq: Once | INTRAMUSCULAR | Status: DC

## 2021-04-29 MED ORDER — DIPHENHYDRAMINE HCL 50 MG/ML IJ SOLN
INTRAMUSCULAR | Status: AC
  Filled 2021-04-29: qty 1

## 2021-04-29 MED ORDER — HEPARIN SODIUM (PORCINE) 1000 UNIT/ML DIALYSIS: 3700.0000 [IU] | INTRAMUSCULAR | Status: DC | PRN

## 2021-04-29 NOTE — Progress Notes (Signed)
?                                                   ?Daily Progress Note  ? ?Patient Name: [name]       Date: 04/29/2021 ?DOB: Oct 14, 1965  Age: 56 y.o. MRN#: [id number] ?Attending Physician: Dwyane Dee, MD ?Primary Care Physician: [name], MD ?Admit Date: 04/22/2021 ? ?Reason for Consultation/Follow-up: Establishing goals of care ? ?Subjective: ?Medical records reviewed. Patient assessed at the bedside. He reports feeling anxious about echo results and possibility of HCPOA documentation being delayed. ? ?Discussed [name]'s experience with HD today - he notes continued complications with hypotension, nausea, pain. He feels this is certainly not how he wishes to continue feeling on dialysis, however he also worries about making decision "based on a few bad days." [name] states that he is 75% sure of his desire to pursue hospice at this time and he is not ready to make a final decision today. He shares that his visit with Father Barnabas Lister was reassuring and supportive of his consideration of hospice. After receiving an update on his echo results today and learning that his aortic valve is stable from 2021 echo, he is feeling some doubts about whether to pursue hospice. Encouraged patient to consider the impact of his valvular disorder and other comorbidities on his overall quality of life. He confirms that his quality of life is worsening - just last night he had awful chest pain and dyspnea while walking to the room door and back to bed. [name] would be willing to cope with this for some time if there was a chance that rehab would extend his life by a couple more years. At the same time, he has unbearable pain with simply extending his legs. ? ?[name] wishes to revisit his request for a stress test to help in his decision making. This is even more important to him after learning about his echo. He feels  that "failing the stress test would make it easy to decide on hospice." Counseled to continue considering his wishes and overall goals of care with or without this information, as this decision is never an easy one. He wishes to continue reflecting over the weekend and feels he would be ready on Monday after one final session of HD.   ? ?Questions and concerns addressed. PMT will continue to support holistically.  ? ?Length of Stay: 4 ? ?Current Medications: ?Scheduled Meds:  ? (feeding supplement) PROSource Plus  30 mL Oral BID BM  ? acetaminophen  325 mg Oral Q6H  ? aspirin EC  81 mg Oral Daily  ? calcium acetate  2,668 mg Oral TID WC  ? Chlorhexidine Gluconate Cloth  6 each Topical Q0600  ? cinacalcet  30 mg Oral Q M,W,F-HD  ? clopidogrel  75 mg Oral Daily  ? diphenhydrAMINE      ? doxercalciferol  4 mcg Intravenous Q M,W,F-HD  ? escitalopram  5 mg Oral Daily  ? famotidine  40 mg Oral Daily  ? [START ON 04/30/2021] furosemide  80 mg Oral Once per day on Sun Tue Thu Sat  ? heparin  5,000 Units Subcutaneous Q8H  ? levothyroxine  137 mcg Oral QAC breakfast  ? lidocaine  1 patch Transdermal Q24H  ? midodrine  10 mg Oral TID WC  ? polyethylene glycol  17 g  Oral Daily  ? rosuvastatin  10 mg Oral QHS  ? zolpidem  10 mg Oral QHS  ? ? ?Continuous Infusions: ? sodium chloride    ? sodium chloride    ? ? ?PRN Meds: ?sodium chloride, sodium chloride, acetaminophen, calcium acetate, feeding supplement (NEPRO CARB STEADY), fluticasone, heparin, heparin, heparin, [START ON 05/02/2021] heparin, lidocaine (PF), lidocaine-prilocaine, LORazepam, melatonin, ondansetron, oxyCODONE-acetaminophen, pentafluoroprop-tetrafluoroeth, promethazine, traZODone ? ?Physical Exam ?Vitals and nursing note reviewed.  ?Constitutional:   ?   General: He is not in acute distress. ?   Appearance: He is ill-appearing.  ?   Interventions: Nasal cannula in place.  ?Cardiovascular:  ?   Rate and Rhythm: Bradycardia present.  ?Pulmonary:  ?   Effort: Pulmonary  effort is normal.  ?Skin: ?   General: Skin is warm and dry.  ?Neurological:  ?   Mental Status: He is alert and oriented to person, place, and time.  ?Psychiatric:     ?   Thought Content: Thought content normal.     ?   Judgment: Judgment normal.  ?         ? ?Vital Signs: BP (!) 87/47   Pulse (!) 54   Temp 98 ?F (36.7 ?C) (Temporal)   Resp 11   Ht 5\' 11"  (1.803 m)   Wt 119.5 kg   SpO2 100%   BMI 36.74 kg/m?  ?SpO2: SpO2: 100 % ?O2 Device: O2 Device: Nasal Cannula ?O2 Flow Rate: O2 Flow Rate (L/min): 2 L/min ? ?Intake/output summary: No intake or output data in the 24 hours ending 04/29/21 1210 ?LBM:   ?Baseline Weight: Weight: 123 kg ?Most recent weight: Weight: 119.5 kg ? ?     ?Palliative Assessment/Data: 40% at best ? ? ? ? ? ?Patient Active Problem List  ? Diagnosis Date Noted  ? Suicidal ideation   ? Goals of care, counseling/discussion   ? 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 Abscess of left thigh 01/28/2020  ? Coronary artery calcification   ? Angina at rest [facility])   ? Complication of vascular access for dialysis   ? Malfunction of nephrostomy tube ([place])   ? Complication of nephrostomy ([place]) 03/20/2019  ? Abnormal CT scan, gastrointestinal tract   ? Hydronephrosis with renal and ureteral calculus obstruction 11/26/2018  ? Mild protein-calorie malnutrition ([place]) 04/24/2018  ? Class 2 severe obesity due to excess calories with serious comorbidity and body mass index (BMI) of 37.0 to 37.9 in adult [facility]) 02/26/2018  ? PAD (peripheral artery disease) ([place]) 12/22/2017  ? Anxiety 12/21/2017  ? Left great toe amputee ([place]) 12/19/2017  ? Endocarditis of mitral valve   ? ESRD (end stage renal disease) on dialysis ([place]) 08/07/2017  ? Aortic stenosis 02/26/2017  ? Nonhealing surgical wound 02/29/2016  ? Epilepsy ([place]) 12/30/2012  ? Complication of AV dialysis fistula 06/27/2012  ? End stage renal disease ([place]) 06/27/2012  ? Personal history of Methicillin resistant Staphylococcus aureus infection  11/17/2008  ? Anemia in chronic kidney disease 10/01/2007  ? Essential (primary) hypertension 10/01/2007  ? ? ?Palliative Care Assessment & Plan  ? ?Patient Profile: ?[name] is a 56 y.o. male with medical history significant of severe aortic stenosis, ESRD on HD, recurrent kidney stone, spinal bifida, neurogenic bladder status post nephrostomy, recurrent UTI, CAD, hypothyroidism, chronic pain and narcotic dependence, presented with anginal-like chest pain and near syncope's. ? ?PMT has been consulted to assist with goals of care conversation. ? ?Assessment: ?ESRD on HD ?Moderately  severe to severe AS ?Accelerating angina ?Goals of care conversation ? ? ?Recommendations/Plan: ?DNR ?Patient is leaning towards hospice but not ready to make final decision today ?Patient would like to reflect over the weekend and make his decision on Monday ?Patient continues to express desire for stress test, will relay to attending and cardiology ?PMT will continue to support ? ?Prognosis: ? Poor long-term prognosis  ? ?Discharge Planning: ?To Be Determined ? ?Care plan was discussed with Patient, Dr. Sabino Gasser, Dr. Burt Knack, Chaplain Sallyanne Kuster ? ? ? ?MDM: [place] ? ?Dorthy Cooler, PA-C ?Palliative Medicine Team ?Team phone # [phone] ? ?Thank you for allowing the Palliative Medicine Team to assist in the care of this patient. Please utilize secure chat with additional questions, if there is no response within 30 minutes please call the above phone number. ? ?Palliative Medicine Team providers are available by phone from 7am to 7pm daily and can be reached through the team cell phone.  ?Should this patient require assistance outside of these hours, please call the patient's attending physician.  ? ?

## 2021-04-29 NOTE — Progress Notes (Signed)
Received report from [place], Therapist, sports. Patient A&Ox4 and sitting on the side of the bed. No complaints at this time. ?

## 2021-04-29 NOTE — Progress Notes (Signed)
removed 872mls net fluid unable to remove more due to hypotension.  pre bp 131/72 post bp 91/40 pre weight 119.5kg post weight 118.5kg bed scales,  gave po phenrgan for nausea and iv benadryl as ordered.  packed with with cathflo activase as ordered. catheter ran at 300 ml/min,  clamped and capped. ?

## 2021-04-29 NOTE — Progress Notes (Signed)
PT Cancellation Note ? ?Patient Details ?Name: [name] ?MRN: [id number] ?DOB: 10-Jan-1965 ? ? ?Cancelled Treatment:    Reason Eval/Treat Not Completed: Patient at procedure or test/unavailable. Will plan to follow-up later as time permits. ? ? ?Moishe Spice, PT, DPT ?Acute Rehabilitation Services  ?Pager: [phone] ?Office: [phone] ? ? ? ?[name] ?04/29/2021, 8:19 AM ? ? ?

## 2021-04-29 NOTE — Progress Notes (Addendum)
This chaplain attempted to facilitate the notarization of the Pt. Advance Directive: HCPOA and Living Will. The Pt. unsigned document is in the Pt. paper chart. ? ?The chaplain understands hospital volunteers are not available to be witnesses. Unit visitors have also declined the chaplain's requests for witnesses. ? ?This chaplain will F/U with the Pt. later in the day. ? ?**[id number] This chaplain is present with the Pt., notary, and witnesses for the notarizing of the Pt. Advance Directive:HCPOA and Living Will.  ? ?The Pt. named [employer] as his HCPOA. If the Pt. HCPOA is unable or unwilling to serve in this role, the Pt. next choice is [employer]. ? ?The chaplain gave the Pt. the original AD along with two copies. A copy of the Pt. AD was scanned into the Pt. EMR. ? ?This chaplain is available for F/U spiritual care as needed. ? ?Chaplain Sallyanne Kuster ?[phone] ?

## 2021-04-29 NOTE — Procedures (Signed)
Patient was seen on dialysis and the procedure was supervised.  BFR 350  Via TDC BP is  105/59. ?He wants benadryl, d/w RN. ? ? Patient appears to be tolerating treatment well. ? ?Drain ?04/29/2021 ? ?

## 2021-04-29 NOTE — Progress Notes (Signed)
?Progress Note ? ? ? ?[name]   ?ZPH:[id number]  ?DOB: 1965-08-06  ?DOA: 04/22/2021     4 ?PCP: [name], MD ? ?Initial CC: Chest pain, near syncope ? ?Hospital Course: ?Bastien Strawser is a 56 y.o. male with medical history significant of severe aortic stenosis, ESRD on HD, recurrent kidney stone, spinal bifida, neurogenic bladder status post nephrostomy, recurrent UTI, CAD, hypothyroidism, chronic pain and narcotic dependence, who presented with anginal-like chest pain and dizziness/lightheadedness. ? ?He endorsed being under significant amount of stress lately due to now being homeless he says. He also has multiple chronic medical issues including difficulty with dialysis over the years which stresses him out also he says. ?He had presented to the ER initially on 04/22/2021 and voiced some suicidal ideations and had been placed under IVC however he states these were situational from his stress and current acute illness of feeling poorly.  After further evaluation and monitoring he was felt to be no further self-harm and had no further ideations. ? ?He was admitted for evaluations with cardiology due to concern for worsening symptomatic aortic stenosis. ? ?Interval History:  ?Patient met again with palliative care today.  Still undecided regarding true next steps in terms of stopping dialysis or not.  Wishing to continue thinking and further decisions on Monday. ?I discussed case with cardiology and with patient regarding the stress testing and after clarification, patient understands there is no recommendation or indication for stress testing nor would it change management. ? ?Assessment and Plan: ?  ?Severe symptomatic aortic stenosis ?-Greatly appreciate cardiology evaluation.  Patient has been having more progressive symptoms including chest pain and near syncope ?- After cardiology discussion, patient not considered candidate for TAVR due to multiple medical comorbidities, severe PAD, and poor  social resources ?-Greatly appreciate and agree with palliative care discussions with patient.  He is now considering possible discontinuation of dialysis and pursuing hospice.  He plans to think about this further throughout the weekend and discussing again on Monday ?-Discussed patient's concern about stress testing with cardiology.  Clarified and there is no indication or need for stress testing nor does patient have great ability for even tolerating this and would also not change management.  After clarification, patient endorses understanding and no further issues on this topic ? ?ESRD on HD ?-Nephrology on board, continue MWF HD. ?- see above discussion ?  ?Suicidal ideation - resolved ?-Resolved, no indication for 1:1 monitoring. ?-Continue current regimen of psychiatric medications ?-Case manager and palliative care also consulted ?  ?Acute headache - resolved  ?-No focal findings on exam ?- CT head also negative for acute abnormalities  ? ? Chronic anemia on CKD ?-H&H stable. ?  ?CAD ?-Continue aspirin Plavix and statin ?  ?Hypothyroidism ?-continue Synthroid. ? ? ? ?Old records reviewed in assessment of this patient ? ?Antimicrobials: ? ? ?DVT prophylaxis:  ?heparin injection 5,000 Units Start: 04/25/21 2200 ? ? ?Code Status:   Code Status: DNR ? ?Disposition Plan:  pending palliative care discussions; next one on 5/1 ?Status is: Inpt ? ?Objective: ?Blood pressure (!) 76/57, pulse (!) 59, temperature 98.9 ?F (37.2 ?C), temperature source Oral, resp. rate 18, height _0  (1.803 m), weight 118.4 kg, SpO2 99 %.  ?Examination:  ?Physical Exam ?Constitutional:   ?   Appearance: Normal appearance.  ?   Comments: Chronically ill-appearing disheveled man lying in bed in NAD and appears more comfortable today   ?HENT:  ?   Head: Normocephalic and atraumatic.  ?  Mouth/Throat:  ?   Mouth: Mucous membranes are moist.  ?Eyes:  ?   Extraocular Movements: Extraocular movements intact.  ?Cardiovascular:  ?   Rate and  Rhythm: Normal rate and regular rhythm.  ?   Heart sounds: Normal heart sounds.  ?   Comments: 3/6 HSM appreciated throughout precordium ?Pulmonary:  ?   Effort: Pulmonary effort is normal. No respiratory distress.  ?   Breath sounds: Normal breath sounds. No wheezing.  ?Abdominal:  ?   General: Bowel sounds are normal. There is no distension.  ?   Palpations: Abdomen is soft.  ?   Tenderness: There is no abdominal tenderness.  ?Musculoskeletal:     ?   General: Swelling present. Normal range of motion.  ?   Cervical back: Normal range of motion and neck supple.  ?Skin: ?   Comments: Scattered excoriations and old fistulas noted  ?Neurological:  ?   General: No focal deficit present.  ?Psychiatric:     ?   Mood and Affect: Mood normal.     ?   Behavior: Behavior normal.  ?  ? ?Consultants:  ?Cardiology ?Nephrology ?Palliative care ? ?Procedures:  ? ? ?Data Reviewed: ?Results for orders placed or performed during the hospital encounter of 04/22/21 (from the past 24 hour(s))  ?CBC with Differential/Platelet     Status: Abnormal  ? Collection Time: 04/29/21  3:01 AM  ?Result Value Ref Range  ? WBC 4.7 4.0 - 10.5 K/uL  ? RBC 3.54 (L) 4.22 - 5.81 MIL/uL  ? Hemoglobin 10.6 (L) 13.0 - 17.0 g/dL  ? HCT 35.3 (L) 39.0 - 52.0 %  ? MCV 99.7 80.0 - 100.0 fL  ? MCH 29.9 26.0 - 34.0 pg  ? MCHC 30.0 30.0 - 36.0 g/dL  ? RDW 14.7 11.5 - 15.5 %  ? Platelets 94 (L) 150 - 400 K/uL  ? nRBC 0.0 0.0 - 0.2 %  ? Neutrophils Relative % 69 %  ? Neutro Abs 3.2 1.7 - 7.7 K/uL  ? Lymphocytes Relative 17 %  ? Lymphs Abs 0.8 0.7 - 4.0 K/uL  ? Monocytes Relative 9 %  ? Monocytes Absolute 0.4 0.1 - 1.0 K/uL  ? Eosinophils Relative 5 %  ? Eosinophils Absolute 0.3 0.0 - 0.5 K/uL  ? Basophils Relative 0 %  ? Basophils Absolute 0.0 0.0 - 0.1 K/uL  ? Immature Granulocytes 0 %  ? Abs Immature Granulocytes 0.02 0.00 - 0.07 K/uL  ?Magnesium     Status: None  ? Collection Time: 04/29/21  3:01 AM  ?Result Value Ref Range  ? Magnesium 2.2 1.7 - 2.4 mg/dL   ?Renal function panel     Status: Abnormal  ? Collection Time: 04/29/21  3:01 AM  ?Result Value Ref Range  ? Sodium 135 135 - 145 mmol/L  ? Potassium 5.4 (H) 3.5 - 5.1 mmol/L  ? Chloride 99 98 - 111 mmol/L  ? CO2 24 22 - 32 mmol/L  ? Glucose, Bld 80 70 - 99 mg/dL  ? BUN 54 (H) 6 - 20 mg/dL  ? Creatinine, Ser 9.77 (H) 0.61 - 1.24 mg/dL  ? Calcium 8.9 8.9 - 10.3 mg/dL  ? Phosphorus 4.8 (H) 2.5 - 4.6 mg/dL  ? Albumin 3.3 (L) 3.5 - 5.0 g/dL  ? GFR, Estimated 6 (L) >60 mL/min  ? Anion gap 12 5 - 15  ?  ?I have Reviewed nursing notes, Vitals, and Lab results since pt's last encounter. Pertinent lab results : see above ?I  have ordered test including BMP, CBC, Mg ?I have reviewed the last note from staff over past 24 hours ?I have discussed pt's care plan and test results with nursing staff, case manager ? ? LOS: 4 days  ? ?Dwyane Dee, MD ?[facility] ?04/29/2021, 5:33 PM ? ?

## 2021-04-29 NOTE — Progress Notes (Addendum)
?[facility] ?Progress Note  ? ?Subjective: Seen on HD. Now strongly considering Hospice.  ?Appreciate Palliative Care and their support. He says the benefits of hospice outweigh "being imprisoned by my body". He has been on HD for 26 years. He is tired. Emotional support to patient.  ? ?Objective ?Vitals:  ? 04/29/21 0800 04/29/21 0810 04/29/21 0830 04/29/21 0900  ?BP: 131/72 130/66 124/62 (!) 105/59  ?Pulse: (!) 57 (!) 54 (!) 53 (!) 55  ?Resp: 18 11  12   ?Temp: 98 ?F (36.7 ?C)     ?TempSrc: Temporal     ?SpO2: 100%     ?Weight: 119.5 kg     ?Height:      ? ?Physical Exam ?General: Chronically Ill appearing male in NAD ?Heart: U2,V2 3/6 systolic M RUSB SB on monitor ?Lungs: Coarse breath sounds upper airways. No WOB ?Abdomen: obese, NABS ?Extremities:no LE edema ?Dialysis Access: R thigh HD TDC blood lines connected ? ? ? ?Additional Objective ?Labs: ?Basic Metabolic Panel: ?Recent Labs  ?Lab 04/28/21 ?0134 04/28/21 ?1115 04/29/21 ?0301  ?NA 135 135 135  ?K 4.6 4.4 5.4*  ?CL 99 97* 99  ?CO2 28 26 24   ?GLUCOSE 126* 129* 80  ?BUN 37* 43* 54*  ?CREATININE 7.48* 8.33* 9.77*  ?CALCIUM 8.6* 8.7* 8.9  ?PHOS 4.4 4.2 4.8*  ? ?Liver Function Tests: ?Recent Labs  ?Lab 04/22/21 ?1437 04/27/21 ?0128 04/28/21 ?0134 04/28/21 ?1115 04/29/21 ?0301  ?AST 16  --   --   --   --   ?ALT 7  --   --   --   --   ?ALKPHOS 171*  --   --   --   --   ?BILITOT 1.1  --   --   --   --   ?PROT 8.7*  --   --   --   --   ?ALBUMIN 4.0   < > 3.1* 3.3* 3.3*  ? < > = values in this interval not displayed.  ? ?No results for input(s): LIPASE, AMYLASE in the last 168 hours. ?CBC: ?Recent Labs  ?Lab 04/22/21 ?1437 04/27/21 ?0128 04/28/21 ?0134 04/28/21 ?1115 04/29/21 ?0301  ?WBC 5.3 4.4 4.8 5.3 4.7  ?NEUTROABS 4.2 3.0 3.2  --  3.2  ?HGB 12.6* 10.9* 11.0* 11.2* 10.6*  ?HCT 40.5 35.4* 35.2* 36.0* 35.3*  ?MCV 99.0 98.1 97.2 96.8 99.7  ?PLT PLATELET CLUMPS NOTED ON SMEAR, UNABLE TO ESTIMATE 99* 98* 103* 94*  ? ?Blood Culture ?   ?Component  Value Date/Time  ? SDES BLOOD RIGHT ANTECUBITAL 03/09/2021 1601  ? SDES BLOOD LEFT ANTECUBITAL 03/09/2021 1601  ? SPECREQUEST  03/09/2021 1601  ?  BOTTLES DRAWN AEROBIC ONLY Blood Culture adequate volume  ? SPECREQUEST  03/09/2021 1601  ?  BOTTLES DRAWN AEROBIC ONLY Blood Culture results may not be optimal due to an inadequate volume of blood received in culture bottles  ? CULT  03/09/2021 1601  ?  NO GROWTH 5 DAYS ?Performed at [facility], [place] [address] [place], [place] [address] ?  ? CULT  03/09/2021 1601  ?  NO GROWTH 5 DAYS ?Performed at [facility], [place] [address], [place], [place] [address] ?  ? REPTSTATUS 03/14/2021 FINAL 03/09/2021 1601  ? REPTSTATUS 03/14/2021 FINAL 03/09/2021 1601  ? ? ?Cardiac Enzymes: ?No results for input(s): CKTOTAL, CKMB, CKMBINDEX, TROPONINI in the last 168 hours. ?CBG: ?No results for input(s): GLUCAP in the last 168 hours. ?Iron Studies: No results for input(s): IRON,  TIBC, TRANSFERRIN, FERRITIN in the last 72 hours. ?@[email or web address] @ ?Studies/Results: ?No results found. ?Medications: ? sodium chloride    ? sodium chloride    ? ? (feeding supplement) PROSource Plus  30 mL Oral BID BM  ? acetaminophen  325 mg Oral Q6H  ? aspirin EC  81 mg Oral Daily  ? calcium acetate  2,668 mg Oral TID WC  ? Chlorhexidine Gluconate Cloth  6 each Topical Q0600  ? cinacalcet  30 mg Oral Q M,W,F-HD  ? clopidogrel  75 mg Oral Daily  ? diphenhydrAMINE      ? doxercalciferol  4 mcg Intravenous Q M,W,F-HD  ? escitalopram  5 mg Oral Daily  ? famotidine  40 mg Oral Daily  ? [START ON 04/30/2021] furosemide  80 mg Oral Once per day on Sun Tue Thu Sat  ? heparin  5,000 Units Subcutaneous Q8H  ? levothyroxine  137 mcg Oral QAC breakfast  ? lidocaine  1 patch Transdermal Q24H  ? midodrine  10 mg Oral TID WC  ? polyethylene glycol  17 g Oral Daily  ? rosuvastatin  10 mg Oral QHS  ? zolpidem  10 mg Oral QHS  ? ? ? ?Dialysis Orders: ?MWF - [place] ([place]) ?4:45hr, BFR 350, DFR  800,  EDW 118-118.5 (121 kg needs to be updated), 2K/ 2Ca ?Heparin 3700 units IV ?Mircera 50 last given 4/12 ?Hectorol 22mcg IV qHD ?Sensipar 90mg   ?Phoslo 667mg  4 capsules with meals and 3 with snacks ?  ?Assessment/Plan: ?1. Severe aortic stenosis, dyspnea/CP: Cardiology following, not a surgical candidate due to comorbidities. Palliative care involved, he is considering hospice - plans to make a decision this week, although reiterated that he does not have to decide if he is not ready. ?2. ESRD: Continue HD q MWF for now. Femoral TDC requires Cathflo q HD. HD today. Next HD 05/02/2021 ?3. HypoTN/volume: Chronically low BP, started on midodrine 10mg  TID. No edema. ?4. Anemia of ESRD: Hgb 10.6. no ESA for now. ?5. Secondary hyperparathyroidism: Ca/Phos to goal, continue Phoslo, VDRA, sensipar. ?6. Nutrition: Alb 3.1 - start supplements. ?7. Suicidal ideation: Multiple social issues and seems like he is homeless now. Palliative care and SW following. ?8. Nausea: Chronic with HD - prn meds. ?9. DNR. Now leaning toward going to hospice.  ? ?[name]. Brown NP-C ?04/29/2021, 9:28 AM  ?[facility] ?[phone] ? ?Nephrology attending ?I have personally seen and examined the patient.  Chart reviewed.  I agree with above. ?ESRD on HD tolerating dialysis well.  Patient is emotional and talking with palliative care, chaplain.  He is leaning towards hospice care.  We will continue to provide supportive care and dialysis for now. ? ?D.  Carolin Sicks, MD ?[facility]. ? ? ?  ? ?

## 2021-04-30 DIAGNOSIS — I350 Nonrheumatic aortic (valve) stenosis: Secondary | ICD-10-CM | POA: Diagnosis not present

## 2021-04-30 MED ORDER — MIDODRINE HCL 5 MG PO TABS
15.0000 mg | ORAL_TABLET | Freq: Three times a day (TID) | ORAL | Status: DC
  Administered 2021-04-30 – 2021-05-02 (×6): 15 mg via ORAL
  Filled 2021-04-30 (×6): qty 3

## 2021-04-30 MED ORDER — KETOROLAC TROMETHAMINE 15 MG/ML IJ SOLN
15.0000 mg | Freq: Once | INTRAMUSCULAR | Status: AC
  Administered 2021-04-30: 15 mg via INTRAVENOUS
  Filled 2021-04-30: qty 1

## 2021-04-30 NOTE — Progress Notes (Signed)
PT Cancellation Note ? ?Patient Details ?Name: [name] ?MRN: [id number] ?DOB: Jan 01, 1966 ? ? ?Cancelled Treatment:    Reason Eval/Treat Not Completed: Other (comment) On arrival to room pt very solidly asleep. Will check back as time allows.  ? ? ?Allena Katz ?04/30/2021, 3:47 PM ?

## 2021-04-30 NOTE — Progress Notes (Signed)
?Progress Note ? ? ? ?[name]   ?ZOX:[id number]  ?DOB: 1965-03-15  ?DOA: 04/22/2021     5 ?PCP: [name], MD ? ?Initial CC: Chest pain, near syncope ? ?Hospital Course: ?[name] is a 56 y.o. male with medical history significant of severe aortic stenosis, ESRD on HD, recurrent kidney stone, spinal bifida, neurogenic bladder status post nephrostomy, recurrent UTI, CAD, hypothyroidism, chronic pain and narcotic dependence, who presented with anginal-like chest pain and dizziness/lightheadedness. ? ?He endorsed being under significant amount of stress lately due to now being homeless he says. He also has multiple chronic medical issues including difficulty with dialysis over the years which stresses him out also he says. ?He had presented to the ER initially on 04/22/2021 and voiced some suicidal ideations and had been placed under IVC however he states these were situational from his stress and current acute illness of feeling poorly.  After further evaluation and monitoring he was felt to be no further self-harm and had no further ideations. ? ?He was admitted for evaluations with cardiology due to concern for worsening symptomatic aortic stenosis. ? ?Interval History:  ?No events overnight.  Complaining of right-sided flank pain today which she thinks are due to his kidney stones.  Otherwise appears about the same and generally looks depressed which is not unreasonable given the decisions he has been trying to make. ? ?Assessment and Plan: ?  ?Severe symptomatic aortic stenosis ?-Greatly appreciate cardiology evaluation.  Patient has been having more progressive symptoms including chest pain and near syncope ?- After cardiology discussion, patient not considered candidate for TAVR due to multiple medical comorbidities, severe PAD, and poor social resources ?-Greatly appreciate and agree with palliative care discussions with patient.  He is now considering possible discontinuation of dialysis and  pursuing hospice.  He plans to think about this further throughout the weekend and discussing again on Monday ?-Discussed patient's concern about stress testing with cardiology.  Clarified and there is no indication or need for stress testing nor does patient have great ability for even tolerating this and would also not change management.  After clarification, patient endorses understanding and no further issues on this topic ? ?ESRD on HD ?-Nephrology on board, continue MWF HD. ?- see above discussion ? ?Hypotension ?- Continue midodrine ?- Dose adjusted per nephrology ?  ?Suicidal ideation - resolved ?-Resolved, no indication for 1:1 monitoring. ?-Continue current regimen of psychiatric medications ?-Case manager and palliative care also consulted ?  ?Acute headache - resolved  ?-No focal findings on exam ?- CT head also negative for acute abnormalities  ? ? Chronic anemia on CKD ?-H&H stable. ?  ?CAD ?-Continue aspirin Plavix and statin ?  ?Hypothyroidism ?-continue Synthroid. ? ? ? ?Old records reviewed in assessment of this patient ? ?Antimicrobials: ? ? ?DVT prophylaxis:  ?heparin injection 5,000 Units Start: 04/25/21 2200 ? ? ?Code Status:   Code Status: DNR ? ?Disposition Plan:  pending palliative care discussions; next one on 5/1 ?Status is: Inpt ? ?Objective: ?Blood pressure (!) 66/40, pulse (!) 52, temperature 98.1 ?F (36.7 ?C), temperature source Oral, resp. rate 13, height 5\' 11"  (1.803 m), weight 118.4 kg, SpO2 98 %.  ?Examination:  ?Physical Exam ?Constitutional:   ?   Appearance: Normal appearance.  ?   Comments: Chronically ill-appearing disheveled man lying in bed in NAD and appears more comfortable today   ?HENT:  ?   Head: Normocephalic and atraumatic.  ?   Mouth/Throat:  ?   Mouth: Mucous membranes are  moist.  ?Eyes:  ?   Extraocular Movements: Extraocular movements intact.  ?Cardiovascular:  ?   Rate and Rhythm: Normal rate and regular rhythm.  ?   Heart sounds: Normal heart sounds.  ?    Comments: 3/6 HSM appreciated throughout precordium ?Pulmonary:  ?   Effort: Pulmonary effort is normal. No respiratory distress.  ?   Breath sounds: Normal breath sounds. No wheezing.  ?Abdominal:  ?   General: Bowel sounds are normal. There is no distension.  ?   Palpations: Abdomen is soft.  ?   Tenderness: There is no abdominal tenderness.  ?Musculoskeletal:     ?   General: Swelling present. Normal range of motion.  ?   Cervical back: Normal range of motion and neck supple.  ?Skin: ?   Comments: Scattered excoriations and old fistulas noted  ?Neurological:  ?   General: No focal deficit present.  ?Psychiatric:     ?   Mood and Affect: Mood normal.     ?   Behavior: Behavior normal.  ?  ? ?Consultants:  ?Cardiology ?Nephrology ?Palliative care ? ?Procedures:  ? ? ?Data Reviewed: ?No results found for this or any previous visit (from the past 24 hour(s)). ?  ?I have Reviewed nursing notes, Vitals, and Lab results since pt's last encounter. Pertinent lab results : see above ?I have ordered test including BMP, CBC, Mg ?I have reviewed the last note from staff over past 24 hours ?I have discussed pt's care plan and test results with nursing staff, case manager ? ? LOS: 5 days  ? ?Dwyane Dee, MD ?[facility] ?04/30/2021, 1:55 PM ? ?

## 2021-04-30 NOTE — Progress Notes (Addendum)
?[facility] ?Progress Note  ? ?Subjective: Seen in room, sleeping. Thus far, plan is to have HD 05/02/2021 then decide to go to hospice. No issues over night. He seems to be working toward acceptance of situation. Emotional support to patient.      ? ?Objective ?Vitals:  ? 04/30/21 0313 04/30/21 0414 04/30/21 0800 04/30/21 0917  ?BP: (!) 82/46 (!) 88/52  (!) 90/30  ?Pulse: (!) 53 (!) 53  (!) 59  ?Resp: 15 18  11   ?Temp: 98.3 ?F (36.8 ?C)   98.1 ?F (36.7 ?C)  ?TempSrc: Oral   Oral  ?SpO2: 99% 99% 97% 99%  ?Weight:      ?Height:      ? ?Physical Exam ?General: Chronically Ill appearing male in NAD ?Heart: W8,G8 3/6 systolic M RUSB SB on monitor ?Lungs: Coarse breath sounds upper airways. No WOB ?Abdomen: obese, NABS ?Extremities:no LE edema ?Dialysis Access: R thigh HD TDC drsg.  ? ? ?Additional Objective ?Labs: ?Basic Metabolic Panel: ?Recent Labs  ?Lab 04/28/21 ?0134 04/28/21 ?1115 04/29/21 ?0301  ?NA 135 135 135  ?K 4.6 4.4 5.4*  ?CL 99 97* 99  ?CO2 28 26 24   ?GLUCOSE 126* 129* 80  ?BUN 37* 43* 54*  ?CREATININE 7.48* 8.33* 9.77*  ?CALCIUM 8.6* 8.7* 8.9  ?PHOS 4.4 4.2 4.8*  ? ?Liver Function Tests: ?Recent Labs  ?Lab 04/28/21 ?0134 04/28/21 ?1115 04/29/21 ?0301  ?ALBUMIN 3.1* 3.3* 3.3*  ? ?No results for input(s): LIPASE, AMYLASE in the last 168 hours. ?CBC: ?Recent Labs  ?Lab 04/27/21 ?0128 04/28/21 ?0134 04/28/21 ?1115 04/29/21 ?0301  ?WBC 4.4 4.8 5.3 4.7  ?NEUTROABS 3.0 3.2  --  3.2  ?HGB 10.9* 11.0* 11.2* 10.6*  ?HCT 35.4* 35.2* 36.0* 35.3*  ?MCV 98.1 97.2 96.8 99.7  ?PLT 99* 98* 103* 94*  ? ?Blood Culture ?   ?Component Value Date/Time  ? SDES BLOOD RIGHT ANTECUBITAL 03/09/2021 1601  ? SDES BLOOD LEFT ANTECUBITAL 03/09/2021 1601  ? SPECREQUEST  03/09/2021 1601  ?  BOTTLES DRAWN AEROBIC ONLY Blood Culture adequate volume  ? SPECREQUEST  03/09/2021 1601  ?  BOTTLES DRAWN AEROBIC ONLY Blood Culture results may not be optimal due to an inadequate volume of blood received in culture bottles  ? CULT   03/09/2021 1601  ?  NO GROWTH 5 DAYS ?Performed at [facility], [place] [address] [place], [place] [address] ?  ? CULT  03/09/2021 1601  ?  NO GROWTH 5 DAYS ?Performed at [facility], [place] [address], [place], [place] [address] ?  ? REPTSTATUS 03/14/2021 FINAL 03/09/2021 1601  ? REPTSTATUS 03/14/2021 FINAL 03/09/2021 1601  ? ? ?Cardiac Enzymes: ?No results for input(s): CKTOTAL, CKMB, CKMBINDEX, TROPONINI in the last 168 hours. ?CBG: ?No results for input(s): GLUCAP in the last 168 hours. ?Iron Studies: No results for input(s): IRON, TIBC, TRANSFERRIN, FERRITIN in the last 72 hours. ?@lablastinr3 @ ?Studies/Results: ?No results found. ?Medications: ? ? (feeding supplement) PROSource Plus  30 mL Oral BID BM  ? acetaminophen  325 mg Oral Q6H  ? aspirin EC  81 mg Oral Daily  ? calcium acetate  2,668 mg Oral TID WC  ? Chlorhexidine Gluconate Cloth  6 each Topical Q0600  ? cinacalcet  30 mg Oral Q M,W,F-HD  ? clopidogrel  75 mg Oral Daily  ? doxercalciferol  4 mcg Intravenous Q M,W,F-HD  ? escitalopram  5 mg Oral Daily  ? famotidine  40 mg Oral Daily  ? furosemide  80 mg  Oral Once per day on Sun Tue Thu Sat  ? heparin  5,000 Units Subcutaneous Q8H  ? levothyroxine  137 mcg Oral QAC breakfast  ? lidocaine  1 patch Transdermal Q24H  ? midodrine  10 mg Oral TID WC  ? polyethylene glycol  17 g Oral Daily  ? rosuvastatin  10 mg Oral QHS  ? zolpidem  10 mg Oral QHS  ? ? ? ?Dialysis Orders: ?MWF - [place] ([place]) ?4:45hr, BFR 350, DFR 800,  EDW 118-118.5 (121 kg needs to be updated), 2K/ 2Ca ?Heparin 3700 units IV ?Mircera 50 last given 4/12 ?Hectorol 49mcg IV qHD ?Sensipar 90mg   ?Phoslo 667mg  4 capsules with meals and 3 with snacks ?  ?Assessment/Plan: ?1. Severe aortic stenosis, dyspnea/CP: Cardiology following, not a surgical candidate due to comorbidities. Palliative care involved, he is considering hospice - plans to make a decision this week, although reiterated that he does not have to  decide if he is not ready. ?2. ESRD: Continue HD q MWF for now. Femoral TDC requires Cathflo q HD. HD today. Next HD 05/02/2021 ?3. Hypotension/volume: Chronically low BP, started on midodrine 10mg  TID. No edema. ?4. Anemia of ESRD: Hgb 10.6. no ESA for now. ?5. Secondary hyperparathyroidism: Ca/Phos to goal, continue Phoslo, VDRA, sensipar. ?6. Nutrition: Alb 3.1 - start supplements. ?7. Suicidal ideation: Multiple social issues and seems like he is homeless now. Palliative care and SW following. ?8. Nausea: Chronic with HD - prn meds. ?9. DNR. Now leaning toward going to hospice. Appreciate assistance and guidance from Palliative Care and their continued compassionate support of this patient.  ?10. Chronic hyperkalemia: Usually takes Kayelexate over W/E as OP. He refuses Lokelma, says it "doesn't work". Will order RFP for tomorrow.  ? ?[name]. Brown NP-C ?04/30/2021, 9:41 AM  ?[facility] ?[phone] ? ?Nephrology attending: ?I have personally seen and examined the patient.  Chart reviewed.  I agree with above. ? ?He tolerated dialysis well.  Remains hypotensive therefore I will increase midodrine to 15 mg 3 times daily.  Next dialysis on 5/1.  He is talking with chaplain, palliative care team and thinking about hospice.  He still wants to take time to make final decision.  Also considering about possible stress test. ? ?D.  Carolin Sicks,  ?[facility] ? ? ?  ? ?

## 2021-05-01 DIAGNOSIS — I350 Nonrheumatic aortic (valve) stenosis: Secondary | ICD-10-CM | POA: Diagnosis not present

## 2021-05-01 LAB — RENAL FUNCTION PANEL
Albumin: 3.2 g/dL — ABNORMAL LOW (ref 3.5–5.0)
Anion gap: 11 (ref 5–15)
BUN: 57 mg/dL — ABNORMAL HIGH (ref 6–20)
CO2: 27 mmol/L (ref 22–32)
Calcium: 9 mg/dL (ref 8.9–10.3)
Chloride: 98 mmol/L (ref 98–111)
Creatinine, Ser: 9 mg/dL — ABNORMAL HIGH (ref 0.61–1.24)
GFR, Estimated: 6 mL/min — ABNORMAL LOW (ref >60)
Glucose, Bld: 115 mg/dL — ABNORMAL HIGH (ref 70–99)
Phosphorus: 4.3 mg/dL (ref 2.5–4.6)
Potassium: 4.9 mmol/L (ref 3.5–5.1)
Sodium: 136 mmol/L (ref 135–145)

## 2021-05-01 MED ORDER — KETOROLAC TROMETHAMINE 15 MG/ML IJ SOLN
15.0000 mg | Freq: Once | INTRAMUSCULAR | Status: AC
  Administered 2021-05-01: 15 mg via INTRAVENOUS
  Filled 2021-05-01: qty 1

## 2021-05-01 NOTE — Progress Notes (Signed)
?Progress Note ? ? ? ?[name]   ?BHA:[id number]  ?DOB: [birth year]  ?DOA: 04/22/2021     6 ?PCP: [name], MD ? ?Initial CC: Chest pain, near syncope ? ?Hospital Course: ?[name] is a 56 y.o. male with medical history significant of severe aortic stenosis, ESRD on HD, recurrent kidney stone, spinal bifida, neurogenic bladder status post nephrostomy, recurrent UTI, CAD, hypothyroidism, chronic pain and narcotic dependence, who presented with anginal-like chest pain and dizziness/lightheadedness. ? ?He endorsed being under significant amount of stress lately due to now being homeless he says. He also has multiple chronic medical issues including difficulty with dialysis over the years which stresses him out also he says. ?He had presented to the ER initially on 04/22/2021 and voiced some suicidal ideations and had been placed under IVC however he states these were situational from his stress and current acute illness of feeling poorly.  After further evaluation and monitoring he was felt to be no further self-harm and had no further ideations. ? ?He was admitted for evaluations with cardiology due to concern for worsening symptomatic aortic stenosis. ? ?Interval History:  ?No events overnight. He looks so uncomfortable each day and so depressed appearing. He asked to talk to psych today; said he felt like going home and just dying. I tried to re-direct the conversation that the whole principle behind keeping him in the hospital and multiple palliative care discussions are for him to decide if he'd like to d/c dialysis and focus on comfort and going to residential hospice. ?He did state that the 1 time dose of toradol helped yesterday and asked for another dose today if possible.  ?He's still leaning towards HD tomorrow then stopping it. He's worried his family thinks "he's just giving up".  ? ?Assessment and Plan: ?  ?Severe symptomatic aortic stenosis ?-Greatly appreciate cardiology evaluation.   Patient has been having more progressive symptoms including chest pain and near syncope ?- After cardiology discussion, patient not considered candidate for TAVR due to multiple medical comorbidities, severe PAD, and poor social resources ?-Greatly appreciate and agree with palliative care discussions with patient.  He is now considering possible discontinuation of dialysis and pursuing hospice.  He plans to think about this further throughout the weekend and discussing again on Monday ?-Discussed patient's concern about stress testing with cardiology.  Clarified and there is no indication or need for stress testing nor does patient have great ability for even tolerating this and would also not change management.  After clarification, patient endorses understanding and no further issues on this topic ? ?ESRD on HD ?-Nephrology on board, continue MWF HD. ?- see above discussion ? ?Hypotension ?- Continue midodrine ?- Dose adjusted per nephrology ?- trying to use caution to some extent with his pain regimen; he understands the balance trying to be pursued with comfort but still cognizant of his BP ?  ?Suicidal ideation - resolved ?-Resolved, no indication for 1:1 monitoring. ?-Case manager and palliative care also consulted ?  ?Acute headache - resolved  ?-No focal findings on exam ?- CT head also negative for acute abnormalities  ? ? Chronic anemia on CKD ?-H&H stable. ?  ?CAD ?-Continue aspirin Plavix and statin ?  ?Hypothyroidism ?-continue Synthroid. ? ? ? ?Old records reviewed in assessment of this patient ? ?Antimicrobials: ? ? ?DVT prophylaxis:  ?heparin injection 5,000 Units Start: 04/25/21 2200 ? ? ?Code Status:   Code Status: DNR ? ?Disposition Plan:  pending palliative care discussions; next one on 5/1 ?Status  is: Inpt ? ?Objective: ?Blood pressure (!) 75/34, pulse 71, temperature 98.5 ?F (36.9 ?C), temperature source Oral, resp. rate 19, height 5\' 11"  (1.803 m), weight 118.4 kg, SpO2 90 %.  ?Examination:   ?Physical Exam ?Constitutional:   ?   Appearance: Normal appearance.  ?   Comments: Chronically ill-appearing disheveled man lying in bed in NAD and appears more uncomfortable today   ?HENT:  ?   Head: Normocephalic and atraumatic.  ?   Mouth/Throat:  ?   Mouth: Mucous membranes are moist.  ?Eyes:  ?   Extraocular Movements: Extraocular movements intact.  ?Cardiovascular:  ?   Rate and Rhythm: Normal rate and regular rhythm.  ?   Heart sounds: Normal heart sounds.  ?   Comments: 3/6 HSM appreciated throughout precordium ?Pulmonary:  ?   Effort: Pulmonary effort is normal. No respiratory distress.  ?   Breath sounds: Normal breath sounds. No wheezing.  ?Abdominal:  ?   General: Bowel sounds are normal. There is no distension.  ?   Palpations: Abdomen is soft.  ?   Tenderness: There is no abdominal tenderness.  ?Musculoskeletal:     ?   General: Swelling present. Normal range of motion.  ?   Cervical back: Normal range of motion and neck supple.  ?Skin: ?   Comments: Scattered excoriations and old fistulas noted  ?Neurological:  ?   General: No focal deficit present.  ?Psychiatric:     ?   Mood and Affect: Mood is anxious.  ?   Comments: Depressed affect  ?  ? ?Consultants:  ?Cardiology ?Nephrology ?Palliative care ? ?Procedures:  ? ? ?Data Reviewed: ?Results for orders placed or performed during the hospital encounter of 04/22/21 (from the past 24 hour(s))  ?Renal function panel     Status: Abnormal  ? Collection Time: 05/01/21  1:01 AM  ?Result Value Ref Range  ? Sodium 136 135 - 145 mmol/L  ? Potassium 4.9 3.5 - 5.1 mmol/L  ? Chloride 98 98 - 111 mmol/L  ? CO2 27 22 - 32 mmol/L  ? Glucose, Bld 115 (H) 70 - 99 mg/dL  ? BUN 57 (H) 6 - 20 mg/dL  ? Creatinine, Ser 9.00 (H) 0.61 - 1.24 mg/dL  ? Calcium 9.0 8.9 - 10.3 mg/dL  ? Phosphorus 4.3 2.5 - 4.6 mg/dL  ? Albumin 3.2 (L) 3.5 - 5.0 g/dL  ? GFR, Estimated 6 (L) >60 mL/min  ? Anion gap 11 5 - 15  ? ?  ?I have Reviewed nursing notes, Vitals, and Lab results since pt's  last encounter. Pertinent lab results : see above ?I have ordered test including BMP, CBC, Mg ?I have reviewed the last note from staff over past 24 hours ?I have discussed pt's care plan and test results with nursing staff, case manager ? ? LOS: 6 days  ? ?Dwyane Dee, MD ?[facility] ?05/01/2021, 4:06 PM ? ?

## 2021-05-01 NOTE — Progress Notes (Addendum)
?[facility] ?Progress Note  ? ?Subjective: Seen in room, sleeping soundly. No events overnight. HD in AM on schedule.  ? ?Objective ?Vitals:  ? 04/30/21 2000 05/01/21 0000 05/01/21 0325 05/01/21 6045  ?BP: (!) 92/52 (!) 93/46 (!) 100/51 (!) 88/48  ?Pulse: 61 60 63 66  ?Resp: 17 18 17 11   ?Temp: 98.3 ?F (36.8 ?C) 98.6 ?F (37 ?C) 98.9 ?F (37.2 ?C) 98.5 ?F (36.9 ?C)  ?TempSrc: Oral Oral Oral Oral  ?SpO2: 96% 92% 94% 95%  ?Weight:      ?Height:      ? ?Physical Exam ?General: Chronically Ill appearing male in NAD ?Heart: W0,J8 3/6 systolic M RUSB SB on monitor ?Lungs: Coarse breath sounds upper airways. No WOB ?Abdomen: obese, NABS ?Extremities:no LE edema ?Dialysis Access: R thigh HD TDC drsg.  ?  ? ? ? ?Additional Objective ?Labs: ?Basic Metabolic Panel: ?Recent Labs  ?Lab 04/28/21 ?1115 04/29/21 ?0301 05/01/21 ?0101  ?NA 135 135 136  ?K 4.4 5.4* 4.9  ?CL 97* 99 98  ?CO2 26 24 27   ?GLUCOSE 129* 80 115*  ?BUN 43* 54* 57*  ?CREATININE 8.33* 9.77* 9.00*  ?CALCIUM 8.7* 8.9 9.0  ?PHOS 4.2 4.8* 4.3  ? ?Liver Function Tests: ?Recent Labs  ?Lab 04/28/21 ?1115 04/29/21 ?0301 05/01/21 ?0101  ?ALBUMIN 3.3* 3.3* 3.2*  ? ?No results for input(s): LIPASE, AMYLASE in the last 168 hours. ?CBC: ?Recent Labs  ?Lab 04/27/21 ?0128 04/28/21 ?0134 04/28/21 ?1115 04/29/21 ?0301  ?WBC 4.4 4.8 5.3 4.7  ?NEUTROABS 3.0 3.2  --  3.2  ?HGB 10.9* 11.0* 11.2* 10.6*  ?HCT 35.4* 35.2* 36.0* 35.3*  ?MCV 98.1 97.2 96.8 99.7  ?PLT 99* 98* 103* 94*  ? ?Blood Culture ?   ?Component Value Date/Time  ? SDES BLOOD RIGHT ANTECUBITAL 03/09/2021 1601  ? SDES BLOOD LEFT ANTECUBITAL 03/09/2021 1601  ? SPECREQUEST  03/09/2021 1601  ?  BOTTLES DRAWN AEROBIC ONLY Blood Culture adequate volume  ? SPECREQUEST  03/09/2021 1601  ?  BOTTLES DRAWN AEROBIC ONLY Blood Culture results may not be optimal due to an inadequate volume of blood received in culture bottles  ? CULT  03/09/2021 1601  ?  NO GROWTH 5 DAYS ?Performed at [facility], [place]  [address], [place], [place] [address] ?  ? CULT  03/09/2021 1601  ?  NO GROWTH 5 DAYS ?Performed at [facility], [place] [address] [place], [place] [address] ?  ? REPTSTATUS 03/14/2021 FINAL 03/09/2021 1601  ? REPTSTATUS 03/14/2021 FINAL 03/09/2021 1601  ? ? ?Cardiac Enzymes: ?No results for input(s): CKTOTAL, CKMB, CKMBINDEX, TROPONINI in the last 168 hours. ?CBG: ?No results for input(s): GLUCAP in the last 168 hours. ?Iron Studies: No results for input(s): IRON, TIBC, TRANSFERRIN, FERRITIN in the last 72 hours. ?@lablastinr3 @ ?Studies/Results: ?No results found. ?Medications: ? ? (feeding supplement) PROSource Plus  30 mL Oral BID BM  ? acetaminophen  325 mg Oral Q6H  ? aspirin EC  81 mg Oral Daily  ? calcium acetate  2,668 mg Oral TID WC  ? Chlorhexidine Gluconate Cloth  6 each Topical Q0600  ? cinacalcet  30 mg Oral Q M,W,F-HD  ? clopidogrel  75 mg Oral Daily  ? doxercalciferol  4 mcg Intravenous Q M,W,F-HD  ? escitalopram  5 mg Oral Daily  ? famotidine  40 mg Oral Daily  ? furosemide  80 mg Oral Once per day on Sun Tue Thu Sat  ? heparin  5,000 Units Subcutaneous Q8H  ?  levothyroxine  137 mcg Oral QAC breakfast  ? lidocaine  1 patch Transdermal Q24H  ? midodrine  15 mg Oral TID WC  ? polyethylene glycol  17 g Oral Daily  ? rosuvastatin  10 mg Oral QHS  ? zolpidem  10 mg Oral QHS  ? ? ? ?Dialysis Orders: ?MWF - [place] ([place]) ?4:45hr, BFR 350, DFR 800,  EDW 118-118.5 (121 kg needs to be updated), 2K/ 2Ca ?Heparin 3700 units IV ?Mircera 50 last given 4/12 ?Hectorol 80mcg IV qHD ?Sensipar 90mg   ?Phoslo 667mg  4 capsules with meals and 3 with snacks ?  ?Assessment/Plan: ?1. Severe aortic stenosis, dyspnea/CP: Cardiology following, not a surgical candidate due to comorbidities. Palliative care involved. Specifically asked to have HD 05/02/2021 which has been ordered. Considering hospice strongly. He has been on HD for 26 years, down to last possible HD access which a very precarious femoral TDC.  He is tired. Now DNR.  ?2. ESRD: Continue HD q MWF for now. Femoral TDC requires [facility] q HD. HD today. Next HD 05/02/2021 ?3. Hypotension/volume: Chronically low BP, started on midodrine 10mg  TID. Pain medications probably contributing to hypotension. UF as tolerated.  ?4. Anemia of ESRD: Hgb 10.6. no ESA for now. ?5. Secondary hyperparathyroidism: Ca/Phos to goal, continue Phoslo, VDRA, sensipar. ?6. Nutrition: Alb 3.1 - start supplements. ?7. Suicidal ideation: Multiple social issues and seems like he is homeless now. Palliative care and SW following. ?8. Nausea: Chronic with HD - prn meds. ?9. DNR. Now leaning toward going to hospice. Appreciate assistance and guidance from Palliative Care and their continued compassionate support of this patient.  ?10. Chronic hyperkalemia: Usually takes Kayelexate over W/E as OP. He refuses Lokelma, says it "doesn't work". K+ 4.9 this AM. No need for intervention.   ? ?[name]. Brown NP-C ?05/01/2021, 10:07 AM  ?[facility] ?[phone] ? ?Nephrology attending: ?I have personally seen and examined the patient.  Chart reviewed.  I agree with above. ?He is complaining of generalized body pain and requiring pain medication.  Noted blood pressure is low therefore we increase midodrine to 15 mg 3 times daily.  He is DNR and considering hospice but not decided yet.  We will plan to do HD tomorrow.  Continue supportive care. ? ?D.  Roiza Wiedel ?[facility]. ? ? ?  ? ?

## 2021-05-02 DIAGNOSIS — R52 Pain, unspecified: Secondary | ICD-10-CM

## 2021-05-02 DIAGNOSIS — I350 Nonrheumatic aortic (valve) stenosis: Secondary | ICD-10-CM | POA: Diagnosis not present

## 2021-05-02 DIAGNOSIS — F419 Anxiety disorder, unspecified: Secondary | ICD-10-CM

## 2021-05-02 DIAGNOSIS — L02416 Cutaneous abscess of left lower limb: Secondary | ICD-10-CM | POA: Diagnosis not present

## 2021-05-02 DIAGNOSIS — G894 Chronic pain syndrome: Secondary | ICD-10-CM | POA: Diagnosis not present

## 2021-05-02 DIAGNOSIS — Z7189 Other specified counseling: Secondary | ICD-10-CM | POA: Diagnosis not present

## 2021-05-02 DIAGNOSIS — R110 Nausea: Secondary | ICD-10-CM

## 2021-05-02 DIAGNOSIS — F329 Major depressive disorder, single episode, unspecified: Secondary | ICD-10-CM

## 2021-05-02 DIAGNOSIS — R9389 Abnormal findings on diagnostic imaging of other specified body structures: Secondary | ICD-10-CM | POA: Diagnosis not present

## 2021-05-02 LAB — RENAL FUNCTION PANEL
Albumin: 3.2 g/dL — ABNORMAL LOW (ref 3.5–5.0)
Anion gap: 14 (ref 5–15)
BUN: 81 mg/dL — ABNORMAL HIGH (ref 6–20)
CO2: 24 mmol/L (ref 22–32)
Calcium: 9.1 mg/dL (ref 8.9–10.3)
Chloride: 96 mmol/L — ABNORMAL LOW (ref 98–111)
Creatinine, Ser: 11.07 mg/dL — ABNORMAL HIGH (ref 0.61–1.24)
GFR, Estimated: 5 mL/min — ABNORMAL LOW (ref >60)
Glucose, Bld: 95 mg/dL (ref 70–99)
Phosphorus: 4.3 mg/dL (ref 2.5–4.6)
Potassium: 6.6 mmol/L (ref 3.5–5.1)
Sodium: 134 mmol/L — ABNORMAL LOW (ref 135–145)

## 2021-05-02 LAB — MAGNESIUM: Magnesium: 2.3 mg/dL (ref 1.7–2.4)

## 2021-05-02 LAB — CBC WITH DIFFERENTIAL/PLATELET
Abs Immature Granulocytes: 0 10*3/uL (ref 0.00–0.07)
Basophils Absolute: 0.1 10*3/uL (ref 0.0–0.1)
Basophils Relative: 1 %
Eosinophils Absolute: 0.1 10*3/uL (ref 0.0–0.5)
Eosinophils Relative: 1 %
HCT: 35.8 % — ABNORMAL LOW (ref 39.0–52.0)
Hemoglobin: 11 g/dL — ABNORMAL LOW (ref 13.0–17.0)
Lymphocytes Relative: 3 %
Lymphs Abs: 0.2 10*3/uL — ABNORMAL LOW (ref 0.7–4.0)
MCH: 30.4 pg (ref 26.0–34.0)
MCHC: 30.7 g/dL (ref 30.0–36.0)
MCV: 98.9 fL (ref 80.0–100.0)
Monocytes Absolute: 0.7 10*3/uL (ref 0.1–1.0)
Monocytes Relative: 10 %
Neutro Abs: 6 10*3/uL (ref 1.7–7.7)
Neutrophils Relative %: 85 %
Platelets: 120 10*3/uL — ABNORMAL LOW (ref 150–400)
RBC: 3.62 MIL/uL — ABNORMAL LOW (ref 4.22–5.81)
RDW: 15.4 % (ref 11.5–15.5)
WBC: 7 10*3/uL (ref 4.0–10.5)
nRBC: 0 % (ref 0.0–0.2)
nRBC: 0 /100 WBC

## 2021-05-02 LAB — GLUCOSE, CAPILLARY: Glucose-Capillary: 94 mg/dL (ref 70–99)

## 2021-05-02 MED ORDER — PROCHLORPERAZINE MALEATE 10 MG PO TABS
10.0000 mg | ORAL_TABLET | Freq: Four times a day (QID) | ORAL | Status: DC | PRN
  Filled 2021-05-02: qty 1

## 2021-05-02 MED ORDER — GLYCOPYRROLATE 0.2 MG/ML IJ SOLN: 0.2000 mg | INTRAMUSCULAR | Status: DC | PRN

## 2021-05-02 MED ORDER — HYDROMORPHONE HCL 1 MG/ML IJ SOLN
1.0000 mg | INTRAMUSCULAR | Status: DC | PRN
  Administered 2021-05-02 (×2): 1 mg via INTRAVENOUS
  Filled 2021-05-02 (×2): qty 1

## 2021-05-02 MED ORDER — HYDROMORPHONE HCL 1 MG/ML IJ SOLN: 1.0000 mg | INTRAMUSCULAR | Status: DC | PRN

## 2021-05-02 MED ORDER — LORAZEPAM 2 MG/ML IJ SOLN
1.0000 mg | INTRAMUSCULAR | Status: DC | PRN
  Administered 2021-05-02 – 2021-05-03 (×2): 1 mg via INTRAVENOUS
  Filled 2021-05-02 (×2): qty 1

## 2021-05-02 MED ORDER — SODIUM POLYSTYRENE SULFONATE 15 GM/60ML PO SUSP
30.0000 g | Freq: Once | ORAL | Status: DC
  Filled 2021-05-02: qty 120

## 2021-05-02 MED ORDER — PROCHLORPERAZINE EDISYLATE 10 MG/2ML IJ SOLN
10.0000 mg | Freq: Two times a day (BID) | INTRAMUSCULAR | Status: DC | PRN
  Administered 2021-05-02: 10 mg via INTRAVENOUS
  Filled 2021-05-02: qty 2

## 2021-05-02 MED ORDER — DIPHENHYDRAMINE HCL 50 MG/ML IJ SOLN: 12.5000 mg | INTRAMUSCULAR | Status: DC | PRN

## 2021-05-02 MED ORDER — HALOPERIDOL LACTATE 2 MG/ML PO CONC
2.0000 mg | Freq: Four times a day (QID) | ORAL | Status: DC | PRN
  Filled 2021-05-02: qty 5

## 2021-05-02 MED ORDER — SODIUM CHLORIDE 0.9 % IV SOLN
12.5000 mg | Freq: Four times a day (QID) | INTRAVENOUS | Status: DC | PRN
  Filled 2021-05-02: qty 0.5

## 2021-05-02 MED ORDER — SODIUM ZIRCONIUM CYCLOSILICATE 10 G PO PACK
10.0000 g | PACK | Freq: Once | ORAL | Status: AC
  Administered 2021-05-02: 10 g via ORAL
  Filled 2021-05-02: qty 1

## 2021-05-02 MED ORDER — LORAZEPAM 1 MG PO TABS: 1.0000 mg | ORAL_TABLET | Freq: Two times a day (BID) | ORAL | Status: DC | PRN

## 2021-05-02 MED ORDER — OLANZAPINE 5 MG PO TABS: 2.5000 mg | ORAL_TABLET | Freq: Every evening | ORAL | Status: DC | PRN

## 2021-05-02 MED ORDER — PROCHLORPERAZINE 25 MG RE SUPP
25.0000 mg | Freq: Three times a day (TID) | RECTAL | Status: DC | PRN
  Filled 2021-05-02: qty 1

## 2021-05-02 MED ORDER — ACETAMINOPHEN 650 MG RE SUPP: 650.0000 mg | Freq: Four times a day (QID) | RECTAL | Status: DC | PRN

## 2021-05-02 MED ORDER — HALOPERIDOL 1 MG PO TABS
2.0000 mg | ORAL_TABLET | Freq: Four times a day (QID) | ORAL | Status: DC | PRN
  Filled 2021-05-02: qty 2

## 2021-05-02 MED ORDER — LORAZEPAM 1 MG PO TABS: 1.0000 mg | ORAL_TABLET | ORAL | Status: DC | PRN

## 2021-05-02 MED ORDER — PROCHLORPERAZINE EDISYLATE 10 MG/2ML IJ SOLN: 10.0000 mg | Freq: Four times a day (QID) | INTRAMUSCULAR | Status: DC | PRN

## 2021-05-02 MED ORDER — ONDANSETRON HCL 4 MG/2ML IJ SOLN: 4.0000 mg | Freq: Four times a day (QID) | INTRAMUSCULAR | Status: DC

## 2021-05-02 MED ORDER — POLYVINYL ALCOHOL 1.4 % OP SOLN
1.0000 [drp] | Freq: Four times a day (QID) | OPHTHALMIC | Status: DC | PRN
  Filled 2021-05-02: qty 15

## 2021-05-02 MED ORDER — ACETAMINOPHEN 325 MG PO TABS: 650.0000 mg | ORAL_TABLET | Freq: Four times a day (QID) | ORAL | Status: DC | PRN

## 2021-05-02 MED ORDER — PROCHLORPERAZINE 25 MG RE SUPP
25.0000 mg | Freq: Two times a day (BID) | RECTAL | Status: DC | PRN
  Filled 2021-05-02: qty 1

## 2021-05-02 MED ORDER — BIOTENE DRY MOUTH MT LIQD: 15.0000 mL | OROMUCOSAL | Status: DC | PRN

## 2021-05-02 MED ORDER — LORAZEPAM 2 MG/ML PO CONC: 1.0000 mg | ORAL | Status: DC | PRN

## 2021-05-02 MED ORDER — BIOTENE DRY MOUTH MT LIQD: 15.0000 mL | Freq: Two times a day (BID) | OROMUCOSAL | Status: DC

## 2021-05-02 MED ORDER — GLYCOPYRROLATE 1 MG PO TABS
1.0000 mg | ORAL_TABLET | ORAL | Status: DC | PRN
  Filled 2021-05-02: qty 1

## 2021-05-02 MED ORDER — LORAZEPAM 2 MG/ML IJ SOLN
2.0000 mg | INTRAMUSCULAR | Status: DC | PRN
Start: 2021-05-02 — End: 2021-05-02

## 2021-05-02 MED ORDER — SODIUM CHLORIDE 0.9 % IV SOLN
1.0000 mg/h | INTRAVENOUS | Status: DC
  Administered 2021-05-02: 0.5 mg/h via INTRAVENOUS
  Administered 2021-05-04: 1 mg/h via INTRAVENOUS
  Filled 2021-05-02 (×3): qty 2.5

## 2021-05-02 MED ORDER — HALOPERIDOL LACTATE 5 MG/ML IJ SOLN: 2.0000 mg | Freq: Four times a day (QID) | INTRAMUSCULAR | Status: DC | PRN

## 2021-05-02 MED ORDER — ONDANSETRON HCL 4 MG/2ML IJ SOLN
4.0000 mg | Freq: Four times a day (QID) | INTRAMUSCULAR | Status: DC | PRN
  Administered 2021-05-03: 8 mg via INTRAVENOUS
  Filled 2021-05-02: qty 4

## 2021-05-02 NOTE — Progress Notes (Signed)
This chaplain is present for F/U spiritual care with the Pt.. The chaplain understands the Pt. is in agreement with comfort care at this time. The Pt. is sitting up at the end of the bed. The Pt. is willing and able to move towards the head of the bed between the bed rails. The Pt. describes being very uncomfortable with kidney pain and nausea. The chaplain updated PMT NP-Amber. ? ?The chaplain revisited the Pt. ACP in the context of the roles of the Pt. healthcare agents. The chaplain re-affirmed the HCPOAs supportive role of the Pt. wishes. The chaplain understands the Pt. will give his personal belongings to [facility].  ? ?The Pt. and chaplain observed the sacredness of silence and ended the visit with the sharing of scripture. ? ?This chaplain is available for F/U spiritual care as needed. ? ?Chaplain Sallyanne Kuster ?[phone] ? ? ?

## 2021-05-02 NOTE — Progress Notes (Signed)
Late Entry: Dr. Bridgett Larsson, on-call for attending, notified of pt's low BPs (80s/40s). No orders received but MD endorsed to hold narcotics and other BP reducing medications. Will continue to monitor. ?

## 2021-05-02 NOTE — Progress Notes (Addendum)
Was not aware of his low BP's. He is on maximum midodrine and in significant pain. Realistically I doubt we will be able to do anything more with dialysis. HD cancelled. I have told him this and suggested transition to comfort care and he is in agreement. Have d/w attending Dr. Sabino Gasser who also agrees w/ transition. Have just ordered prn IV dilaudid and IV phenergan (which he likes) for him for now. Palliative care is being alerted and will assist as well.  ? ?[name], MD ?05/02/2021, 12:29 PM ? ? ? ? ?

## 2021-05-02 NOTE — Progress Notes (Signed)
?Progress Note ? ? ? ?[name]   ?DJM:[id number]  ?DOB: 20-Sep-1965  ?DOA: 04/22/2021     7 ?PCP: [name], MD ? ?Initial CC: Chest pain, near syncope ? ?Hospital Course: ?[place] Dani is a 56 y.o. male with medical history significant of severe aortic stenosis, ESRD on HD, recurrent kidney stone, spinal bifida, neurogenic bladder status post nephrostomy, recurrent UTI, CAD, hypothyroidism, chronic pain and narcotic dependence, who presented with anginal-like chest pain and dizziness/lightheadedness. ? ?He endorsed being under significant amount of stress lately due to now being homeless he says. He also has multiple chronic medical issues including difficulty with dialysis over the years which stresses him out also he says. ?He had presented to the ER initially on 04/22/2021 and voiced some suicidal ideations and had been placed under IVC however he states these were situational from his stress and current acute illness of feeling poorly.  After further evaluation and monitoring he was felt to be no further self-harm and had no further ideations. ? ?He was admitted for evaluations with cardiology due to concern for worsening symptomatic aortic stenosis. ? ?Interval History:  ?This morning he has developed worsening hypoTN despite efforts to support it as best as possible. He's now somewhat delirious and more lethargic. However during my conversation he was able to explicitly explain that he was done with suffering and did want further pain/anxiety control to not suffer anymore. I talked about a dilaudid drip which he was agreeable with and thankful for. ?He also was evaluated by nephrology, palliative care, and psychiatry this morning too. Entire team is in agreement with pursuit of comfort care at this time and. HD has been discontinued and we will pursue residential hospice if able to get him there.  ? ?Assessment and Plan: ?  ?Goals of care discussions ?- extensive discussions throughout  hospitalization.  He has been closely followed by palliative care as well.  Due to worsening functional status and progressive decline along with great difficulty with dialysis over the years and IV access, decision has not been made for transitioning to total comfort care.  He has been evaluated by psychiatry, palliative care, nephrology, and myself; we are all in agreement as well as patient now for full transition to comfort care ?- d/c all non-essential meds ?- goal is residential hospice if able; if not, would remain inpatient ?- comfort care orders per palliative care  ? ?Severe symptomatic aortic stenosis ?-Greatly appreciate cardiology evaluation.  Patient has been having more progressive symptoms including chest pain and near syncope ?- After cardiology discussion, patient not considered candidate for TAVR due to multiple medical comorbidities, severe PAD, and poor social resources ?-Greatly appreciate and agree with palliative care discussions with patient.   ?- no further workup or treatment ?-Plan is now for pure comfort care ? ?ESRD on HD ?-Nephrology has been following ?- As of 05/02/2021 dialysis no longer being offered as not safe given patient's severe hypotension and poor quality of life.  Pursuit is now for comfort care and residential hospice placement if able ? ?Hypotension ?-Precluding further safe dialysis ?-Discontinue nonessential medications and pursue full comfort care ?  ?Suicidal ideation - resolved ?-Resolved, no indication for 1:1 monitoring. ?-Has been evaluated by psychiatry ?  ?Acute headache - resolved  ?-No focal findings on exam ?- CT head also negative for acute abnormalities  ? ? Chronic anemia on CKD ?-H&H stable. ?  ?CAD ?-Continue aspirin Plavix and statin ?  ?Hypothyroidism ?-continue Synthroid. ? ? ? ?Old  records reviewed in assessment of this patient ? ?Antimicrobials: ? ? ?DVT prophylaxis:  ? ? ? ?Code Status:   Code Status: DNR ? ?Disposition Plan:  comfort care;  residential hospice if able  ?Status is: Inpt ? ?Objective: ?Blood pressure (!) 91/56, pulse 85, temperature 98.5 ?F (36.9 ?C), temperature source Oral, resp. rate 15, height 5\' 11"  (1.803 m), weight 118.4 kg, SpO2 92 %.  ?Examination:  ?Physical Exam ?Constitutional:   ?   Appearance: Normal appearance.  ?   Comments: Chronically ill-appearing disheveled man lying in bed in NAD and appears more uncomfortable today and more lethargic  ?HENT:  ?   Head: Normocephalic and atraumatic.  ?   Mouth/Throat:  ?   Mouth: Mucous membranes are moist.  ?Eyes:  ?   Extraocular Movements: Extraocular movements intact.  ?Cardiovascular:  ?   Rate and Rhythm: Normal rate and regular rhythm.  ?   Heart sounds: Normal heart sounds.  ?   Comments: 3/6 HSM appreciated throughout precordium ?Pulmonary:  ?   Effort: Pulmonary effort is normal. No respiratory distress.  ?   Breath sounds: Normal breath sounds. No wheezing.  ?Abdominal:  ?   General: Bowel sounds are normal. There is no distension.  ?   Palpations: Abdomen is soft.  ?   Tenderness: There is no abdominal tenderness.  ?Musculoskeletal:     ?   General: Swelling present. Normal range of motion.  ?   Cervical back: Normal range of motion and neck supple.  ?Skin: ?   Comments: Scattered excoriations and old fistulas noted  ?Neurological:  ?   General: No focal deficit present.  ?Psychiatric:  ?   Comments: Depressed affect  ?  ? ?Consultants:  ?Cardiology ?Nephrology ?Palliative care ? ?Procedures:  ? ? ?Data Reviewed: ?Results for orders placed or performed during the hospital encounter of 04/22/21 (from the past 24 hour(s))  ?CBC with Differential/Platelet     Status: Abnormal  ? Collection Time: 05/02/21  2:25 AM  ?Result Value Ref Range  ? WBC 7.0 4.0 - 10.5 K/uL  ? RBC 3.62 (L) 4.22 - 5.81 MIL/uL  ? Hemoglobin 11.0 (L) 13.0 - 17.0 g/dL  ? HCT 35.8 (L) 39.0 - 52.0 %  ? MCV 98.9 80.0 - 100.0 fL  ? MCH 30.4 26.0 - 34.0 pg  ? MCHC 30.7 30.0 - 36.0 g/dL  ? RDW 15.4 11.5 - 15.5  %  ? Platelets 120 (L) 150 - 400 K/uL  ? nRBC 0.0 0.0 - 0.2 %  ? Neutrophils Relative % 85 %  ? Neutro Abs 6.0 1.7 - 7.7 K/uL  ? Lymphocytes Relative 3 %  ? Lymphs Abs 0.2 (L) 0.7 - 4.0 K/uL  ? Monocytes Relative 10 %  ? Monocytes Absolute 0.7 0.1 - 1.0 K/uL  ? Eosinophils Relative 1 %  ? Eosinophils Absolute 0.1 0.0 - 0.5 K/uL  ? Basophils Relative 1 %  ? Basophils Absolute 0.1 0.0 - 0.1 K/uL  ? nRBC 0 0 /100 WBC  ? Abs Immature Granulocytes 0.00 0.00 - 0.07 K/uL  ?Magnesium     Status: None  ? Collection Time: 05/02/21  2:25 AM  ?Result Value Ref Range  ? Magnesium 2.3 1.7 - 2.4 mg/dL  ?Renal function panel     Status: Abnormal  ? Collection Time: 05/02/21  2:25 AM  ?Result Value Ref Range  ? Sodium 134 (L) 135 - 145 mmol/L  ? Potassium 6.6 (HH) 3.5 - 5.1 mmol/L  ? Chloride  96 (L) 98 - 111 mmol/L  ? CO2 24 22 - 32 mmol/L  ? Glucose, Bld 95 70 - 99 mg/dL  ? BUN 81 (H) 6 - 20 mg/dL  ? Creatinine, Ser 11.07 (H) 0.61 - 1.24 mg/dL  ? Calcium 9.1 8.9 - 10.3 mg/dL  ? Phosphorus 4.3 2.5 - 4.6 mg/dL  ? Albumin 3.2 (L) 3.5 - 5.0 g/dL  ? GFR, Estimated 5 (L) >60 mL/min  ? Anion gap 14 5 - 15  ?Glucose, capillary     Status: None  ? Collection Time: 05/02/21 12:13 PM  ?Result Value Ref Range  ? Glucose-Capillary 94 70 - 99 mg/dL  ? ?  ?I have Reviewed nursing notes, Vitals, and Lab results since pt's last encounter. Pertinent lab results : see above ?I have ordered test including BMP, CBC, Mg ?I have reviewed the last note from staff over past 24 hours ?I have discussed pt's care plan and test results with nursing staff, case manager ? ? LOS: 7 days  ? ?Dwyane Dee, MD ?[facility] ?05/02/2021, 3:02 PM ? ?

## 2021-05-02 NOTE — Progress Notes (Signed)
?   05/02/21 0900  ?Assess: MEWS Score  ?Level of Consciousness Alert  ?Assess: MEWS Score  ?MEWS Temp 0  ?MEWS Systolic 3  ?MEWS Pulse 0  ?MEWS RR 0  ?MEWS LOC 0  ?MEWS Score 3  ?MEWS Score Color Yellow  ?Assess: if the MEWS score is Yellow or Red  ?Were vital signs taken at a resting state? Yes  ?Focused Assessment No change from prior assessment  ?Early Detection of Sepsis Score *See Row Information* Low  ?MEWS guidelines implemented *See Row Information* Yes  ?Take Vital Signs  ?Increase Vital Sign Frequency  Yellow: Q 2hr X 2 then Q 4hr X 2, if remains yellow, continue Q 4hrs  ?Escalate  ?MEWS: Escalate Yellow: discuss with charge nurse/RN and consider discussing with provider and RRT  ?Notify: Charge Nurse/RN  ?Name of Charge Nurse/RN Notified Jenny Reichmann  ?Date Charge Nurse/RN Notified 05/02/21  ?Time Charge Nurse/RN Notified 0915  ?Document  ?Patient Outcome Not stable and remains on department  ?Progress note created (see row info) Yes  ? ? ?

## 2021-05-02 NOTE — Consult Note (Signed)
[facility] New Face-to-Face Psychiatric Evaluation ? ? ?Service Date: May 02, 2021 ?LOS:  LOS: 7 days  ? ? ?Assessment  ?[name] is a 56 y.o. male admitted medically for 04/22/2021  2:05 PM for chest pain/sx of worsening aortic stenosis. He carries the psychiatric diagnoses of MDD and has a past medical history of  significant of severe aortic stenosis, ESRD on HD, recurrent kidney stone, spinal bifida, neurogenic bladder status post nephrostomy, recurrent UTI, CAD, hypothyroidism, chronic pain and narcotic dependence.Psychiatry was consulted for "patient request; severe depression, difficulty with making EOL decisions" by Dr. Sabino Gasser.  ? ? ?His current presentation of waxing and waning mentation with new onset psychotic symptoms in setting of worsening medical condition and increase in deliriogenic medications is most consistent with delirium.  Current outpatient psychotropic medications include escitalopram 5 mg and historically he has had a poor response to these medications. It is unknown if he was compliant with medications prior to hospitalization. On initial examination, patient was inattentive and endorsed significant delusions as in note below. He did deny SI, HI, and AH/VH. Medication changes made largely to optimize sensorium to aid pt in making end-of-life decisions.  ? ? ?Please see plan below for detailed recommendations.  ? ?Diagnoses:  ?Active Hospital problems: ?Principal Problem: ?  Aortic stenosis ?Active Problems: ?  Complication of AV dialysis fistula ?  End stage renal disease ([place]) ?  Nonhealing surgical wound ?  ESRD (end stage renal disease) on dialysis [facility]) ?  Left great toe amputee ([place]) ?  PAD (peripheral artery disease) ([place]) ?  Hydronephrosis with renal and ureteral calculus obstruction ?  Complication of nephrostomy ([place]) ?  Malfunction of nephrostomy tube ([place]) ?  Complication of vascular access for dialysis ?  Angina at rest [facility]) ?  Coronary artery  calcification ?  Abscess of left thigh ?  Goals of care, counseling/discussion ?  Anemia in chronic kidney disease ?  Class 2 severe obesity due to excess calories with serious comorbidity and body mass index (BMI) of 37.0 to 37.9 in adult [facility]) ?  Essential (primary) hypertension ?  Mild protein-calorie malnutrition ([place]) ?  Personal history of Methicillin resistant Staphylococcus aureus infection ?  Suicidal ideation ?  ? ? ?Plan  ?## Safety and Observation Level:  ?- Based on my clinical evaluation, I estimate the patient to be at low risk of self harm in the current setting ?- At this time, we recommend a routine level of observation. This decision is based on my review of the chart including patient's history and current presentation, interview of the patient, mental status examination, and consideration of suicide risk including evaluating suicidal ideation, plan, intent, suicidal or self-harm behaviors, risk factors, and protective factors. This judgment is based on our ability to directly address suicide risk, implement suicide prevention strategies and develop a safety plan while the patient is in the clinical setting. Please contact our team if there is a concern that risk level has changed. ? ? ?## Medications:  ?-- STOP PRN zolpidem ?-- STOP prn trazodone (serotonergic) ?-- START olanzapine 2.5 mg QHS PRN (sleep) ?-- DECREASE PRN lorazepam from 2 mg q4h to 1 mg PO BID (home dose, prevent w/d) ? ?Would revert changes to lorazepam above if pt made comfort care.  ? ?## Medical Decision Making Capacity:  ?Not formally assessed, although pt with delusions about resurrection complicating EOL decisions ? ?## Further Work-up:  ?-- per primary ? ? ? ?-- most recent EKG on today  had QtC of 434 ?-- Pertinent labwork reviewed earlier this admission includes: CBC with anemia, significant hyperkalemia, echo report with mod/severe aortic dysfunction ? ?## Disposition:  ?-- per primary  ? ?Thank you for this consult  request. Recommendations have been communicated to the primary team.  We will continue to follow at this time, although will sign off if pt made comfort care.  ? ?Joycelyn Schmid A Timber Marshman ? ? ?New history  ?Relevant Aspects of Hospital Course:  ?Admitted on 04/22/2021 for w/u of aortic stenosis, although had spent several days in ED with suicidal ideations prior to being admitted. ? ?Patient Report:  ?Patient seen in AM. He is poorly attentive and falls asleep 4-5 times during formal testing. He knows it is May, but requires prompting multiple times to provide the year. He is unable to participate in formal attention testing (did not understand instructions despite multiple attempts). Notably BP in 60s/30s for much of exam on automatic cuff.  ? ?He is able to provide a good history of recent events, including suicidal thoughts. States he came to the ED because "they don't let you do that in the hospital" to keep himself safe; has not had these thoughts in some time. Aside from SI depression screen largely +. Sleeping poorly, anhedonia, decreased energy + concentration, poor appetite. Sleep is most troublesome to pt. Does not feel that lexapro has helped. ? ?He did endorse numerous delusions. States that he met a "superintelligent blond woman in a cowboy hat" who has the power to resurrect herself after death and has given him the chemicals to do this. Believes if he goes on hospice he would scientifically resurrect with these chemicals into a "new body"; if this fails he would default to God's plan for him (presumably heaven). Endorses some guilt that nurses would be guilty of murder if he opts for hospice care.  ? ? ?CAM-ICU: ?1. Acute change and/or fluctuating course of mental status: Yes ?2. Inattention :  Yes ?"Can you name the months backwards from Decemeber to July?" ?3. Altered Level of Consciousness: Yes ?4. Disorganized Thinking (Errors >1/6):  ?"Will a stone float on water?" "No but some do" ?"Are there fish  in the sea?" "Some" ?"Does one pound weigh more than two?" "Yes" ?"Can you use a hammer to pound a nail?" "Yes"  ?Command(s): ?"Hold up 2 fingers." (Skipped, pt fell asleep) ?"Now do the same thing with the other hand." (Skipped, pt fell asleep) ? ?+ Cam-ICU ? ? ?ROS:  ?Poor sleep, pain, some anxiety ? ?Collateral information:  ?None ? ?Psychiatric History:  ?Information collected from pt, medical record ? ?Family psych history: unknown, maternal family hx of unknown psych dx ? ? ?Social History:  ?Unhoused ?Some family support ? ?Tobacco use: deferred ?Alcohol use: deferred ?Drug use: deferred ? ?Family History:  ?The patient's family history includes Bipolar disorder in his sister; Diabetes in his father, mother, and sister; Heart attack in his father; Heart disease in his mother; Hypertension in his mother. ? ?Medical History: ?Past Medical History:  ?Diagnosis Date  ? Anemia   ? Anxiety   ? Clotted dialysis access [facility]) 01/06/2020  ? Constipation   ? End stage renal failure on dialysis [facility])   ? [employer]; MWF, on HD since 1997, exhausted upper ext access, and possibly LE access; cath dependent in R groin as of 06/2018  ? GERD (gastroesophageal reflux disease)   ? 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 last 1998 (02/29/2016)  ? History of blood  transfusion 2000s  ? "I was having blood loss; never found out from where"  ? History of kidney stones   ? Hypertension   ? hx of - has not taken bp meds in over 2 years  since dialysis  ? Hypothyroidism   ? Insomnia   ? Migraine   ? "due to my BP in my late 1990s; none since" (02/29/2016)  ? Nonhealing surgical wound   ? of the left arteriovenous graft  ? Severe aortic stenosis   ? no problems with per pt. - nephrologist and Dr. Coletta Memos  ? Spina bifida ([place])   ? ? ?Surgical History: ?Past Surgical History:  ?Procedure Laterality Date  ? A/V FISTULAGRAM Right 02/03/2020  ? Procedure: A/V Fistulagram;  Surgeon: Serafina Mitchell, MD;  Location: [place] [facility];  Service:  Cardiovascular;  Laterality: Right;  ? AMPUTATION Left 12/11/2017  ? Procedure: Left Great Toe Amputation;  Surgeon: [name], MD;  Location: [place];  Service: Orthopedics;  Laterality: Left;  ? APPENDECTOMY    ? APPLIC

## 2021-05-02 NOTE — Progress Notes (Signed)
Pt with critical potassium of 6.6. Dr. Bridgett Larsson notified. Order received for dose of  Kayexalate. Will administer and continue to monitor. ?

## 2021-05-02 NOTE — Progress Notes (Signed)
?                                                                                                                                                     ?                                                   ?Daily Progress Note  ? ?Patient Name: [name]       Date: 05/02/2021 ?DOB: 1965-12-02  Age: 56 y.o. MRN#: [id number] ?Attending Physician: Dwyane Dee, MD ?Primary Care Physician: [name], MD ?Admit Date: 04/22/2021 ? ?Reason for Consultation/Follow-up: Establishing goals of care ? ?Subjective: ?Chart review performed.  Reviewed and discussed case with Dr. Sabino Gasser, Dr. Jonnie Finner, Dr. Lovette Cliche, Dr. Hilma Favors. Received report from primary RN - no acute concerns. ? ?Went to visit patient at bedside -no family/visitors present.  Patient was sitting on the edge of the bed awake, oriented, and able to participate in conversation.  He does intermittently fall asleep during my visit but is easily arousable.  Signs and non-verbal gestures of pain and discomfort noted. No respiratory distress, increased work of breathing, or secretions noted.  Patient reports feeling "not too good."  He endorses pain "everywhere" as well as nausea and anxiety.   ? ?Patient went to reposition himself in the bed and unfortunately pulled out IV -RN notified.  Assisted patient in repositioning, removed continuous pulse ox, removed cardiac monitoring, and removed blood pressure cuff connected to monitor to free him from unnecessary lines and tubes. After movement patient endorsed feeling short of breath -  noted oxygen tubing had snapped - replaced patient back on 2 L nasal cannula with improvement in symptoms. ? ?Reviewed information as discussed between himself and nephrology.  Patient has a clear understanding of his current acute medical situation.  Emotional support provided.  Patient is clear that he does not wish to pursue or try any further dialysis treatments and is agreeable for transition to full comfort care today.  He is  still interested in transfer to residential hospice -requesting [employer].  Patient also requests that I call and speak with Arbie Cookey and Inez Catalina to provide updates.  ? ?Requested RN provide bolus dose of Dilaudid along with Ativan when IV is restarted. ? ?Discussed patient with chaplain who will be back to visit with him. ? ?Called Carol/patient's friend and someone he considers a mother figure per his request.  Provided updates that patient is no longer tolerating hemodialysis and patient's decision for transition to full comfort care.  Emotional support provided.  Therapeutic listening provided as she reflects on how close she and patient have been since "gradeschool."  She is supportive of patient's decision  for full comfort care/hospice.  Notified patient had selected residential hospice facility [facility] Reviewed role of healthcare power of attorney in this instance per her request.  Natural trajectory at end-of-life reviewed. All questions and concerns addressed.  Arbie Cookey would appreciate continued updates  -we will call tomorrow morning to provide.  Encouraged to call with questions and/or concerns. PMT number provided.  Arbie Cookey lives in [place] and is likely not going to be able to visit patient. ? ?Called patient's sister/Betty per his request.  Reviewed information as all outlined above with Arbie Cookey, including healthcare power of attorney questions.  Inez Catalina would also appreciate continued updates -will call tomorrow to provide.  Inez Catalina lives in [place] and will try to visit patient early next week.  Encouraged family/friends visit sooner rather than later, as they are able, if they wish to have wakeful/meaningful moments with patient before he becomes too lethargic.  Inez Catalina is also supportive of patient's decision for comfort care/hospice. All questions and concerns addressed. Encouraged to call with questions and/or concerns. PMT number provided. ? ? ?Length of Stay: 7 ? ?Current Medications: ?Scheduled Meds:   ? Chlorhexidine Gluconate Cloth  6 each Topical Q0600  ? ? ?Continuous Infusions: ? HYDROmorphone 0.5 mg/hr (05/02/21 1349)  ? ? ?PRN Meds: ?acetaminophen, antiseptic oral rinse, fluticasone, glycopyrrolate **OR** glycopyrrolate **OR** glycopyrrolate, HYDROmorphone (DILAUDID) injection, LORazepam, polyvinyl alcohol, prochlorperazine **OR** prochlorperazine **OR** prochlorperazine ? ?Physical Exam ?Vitals and nursing note reviewed.  ?Constitutional:   ?   General: He is not in acute distress. ?   Appearance: He is ill-appearing.  ?Pulmonary:  ?   Effort: No respiratory distress.  ?Skin: ?   General: Skin is warm and dry.  ?Neurological:  ?   Mental Status: He is oriented to person, place, and time. He is lethargic.  ?   Motor: Weakness present.  ?Psychiatric:     ?   Attention and Perception: Attention normal.     ?   Behavior: Behavior is cooperative.     ?   Cognition and Memory: Cognition and memory normal.  ?         ? ?Vital Signs: BP (!) 91/56 (BP Location: Right Arm)   Pulse 85   Temp 98.5 ?F (36.9 ?C) (Oral)   Resp 15   Ht 5\' 11"  (1.803 m)   Wt 118.4 kg   SpO2 92%   BMI 36.41 kg/m?  ?SpO2: SpO2: 92 % ?O2 Device: O2 Device: Nasal Cannula ?O2 Flow Rate: O2 Flow Rate (L/min): 2 L/min ? ?Intake/output summary:  ?Intake/Output Summary (Last 24 hours) at 05/02/2021 1415 ?Last data filed at 05/02/2021 0402 ?Gross per 24 hour  ?Intake 600 ml  ?Output --  ?Net 600 ml  ? ?LBM: Last BM Date : 04/27/21 ?Baseline Weight: Weight: 123 kg ?Most recent weight: Weight: 118.4 kg ? ?     ?Palliative Assessment/Data: PPS 30% ? ? ? ? ? ?Patient Active Problem List  ? Diagnosis Date Noted  ? Major depressive disorder with current active episode   ? Suicidal ideation   ? Goals of care, counseling/discussion   ? 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 Abscess of left thigh 01/28/2020  ? Coronary artery calcification   ? Angina at rest [facility])   ? Complication of vascular access for dialysis   ? Malfunction of nephrostomy  tube ([place])   ? Complication of nephrostomy ([place]) 03/20/2019  ? Abnormal CT scan, gastrointestinal tract   ? Hydronephrosis with renal and ureteral calculus  obstruction 11/26/2018  ? Mild protein-calorie malnutrition ([place]) 04/24/2018  ? Class 2 severe obesity due to excess calories with serious comorbidity and body mass index (BMI) of 37.0 to 37.9 in adult [facility]) 02/26/2018  ? PAD (peripheral artery disease) ([place]) 12/22/2017  ? Anxiety 12/21/2017  ? Left great toe amputee ([place]) 12/19/2017  ? Endocarditis of mitral valve   ? ESRD (end stage renal disease) on dialysis ([place]) 08/07/2017  ? Aortic stenosis 02/26/2017  ? Nonhealing surgical wound 02/29/2016  ? Epilepsy ([place]) 12/30/2012  ? Complication of AV dialysis fistula 06/27/2012  ? End stage renal disease ([place]) 06/27/2012  ? Personal history of Methicillin resistant Staphylococcus aureus infection 11/17/2008  ? Anemia in chronic kidney disease 10/01/2007  ? Essential (primary) hypertension 10/01/2007  ? ? ?Palliative Care Assessment & Plan  ? ?Patient Profile: ?Eydan Chianese is a 56 y.o. male with medical history significant of severe aortic stenosis, ESRD on HD, recurrent kidney stone, spinal bifida, neurogenic bladder status post nephrostomy, recurrent UTI, CAD, hypothyroidism, chronic pain and narcotic dependence, presented with anginal-like chest pain and near syncope's. ?  ?PMT has been consulted to assist with goals of care conversation. ? ?Assessment: ?Principal Problem: ?  Aortic stenosis ?Active Problems: ?  Complication of AV dialysis fistula ?  End stage renal disease ([place]) ?  Nonhealing surgical wound ?  ESRD (end stage renal disease) on dialysis [facility]) ?  Left great toe amputee ([place]) ?  PAD (peripheral artery disease) ([place]) ?  Hydronephrosis with renal and ureteral calculus obstruction ?  Complication of nephrostomy ([place]) ?  Malfunction of nephrostomy tube ([place]) ?  Complication of vascular access for dialysis ?  Angina at rest [facility]) ?  Coronary artery  calcification ?  Abscess of left thigh ?  Goals of care, counseling/discussion ?  Anemia in chronic kidney disease ?  Class 2 severe obesity due to excess calories with serious comorbidity and body mass inde

## 2021-05-02 NOTE — Progress Notes (Signed)
?[facility] ?Progress Note  ? ?Subjective: Seen in room. No c/o's today. Says he was thinking about continuing HD through this Friday.  ? ?Objective ?Vitals:  ? 05/01/21 2345 05/02/21 0402 05/02/21 1638 05/02/21 0758  ?BP: (!) 81/44 (!) 71/40 (!) 93/49 (!) 68/43  ?Pulse: 66 65 67 63  ?Resp: 14 16 20 15   ?Temp: 98 ?F (36.7 ?C) 98.2 ?F (36.8 ?C)  98.5 ?F (36.9 ?C)  ?TempSrc: Oral Oral  Oral  ?SpO2: 93% 94% 97% 94%  ?Weight:      ?Height:      ? ?Physical Exam ?General: Chronically Ill appearing male in NAD ?Heart: G6,K5 3/6 systolic M RUSB SB on monitor ?Lungs: Coarse breath sounds upper airways. No WOB ?Abdomen: obese, NABS ?Extremities:no LE edema ?Dialysis Access: R thigh HD TDC drsg.  ?  ? ? ? ?Additional Objective ?Labs: ?Basic Metabolic Panel: ?Recent Labs  ?Lab 04/29/21 ?0301 05/01/21 ?0101 05/02/21 ?0225  ?NA 135 136 134*  ?K 5.4* 4.9 6.6*  ?CL 99 98 96*  ?CO2 24 27 24   ?GLUCOSE 80 115* 95  ?BUN 54* 57* 81*  ?CREATININE 9.77* 9.00* 11.07*  ?CALCIUM 8.9 9.0 9.1  ?PHOS 4.8* 4.3 4.3  ? ? ?Liver Function Tests: ?Recent Labs  ?Lab 04/29/21 ?0301 05/01/21 ?0101 05/02/21 ?0225  ?ALBUMIN 3.3* 3.2* 3.2*  ? ? ?No results for input(s): LIPASE, AMYLASE in the last 168 hours. ?CBC: ?Recent Labs  ?Lab 04/27/21 ?0128 04/28/21 ?0134 04/28/21 ?1115 04/29/21 ?0301 05/02/21 ?0225  ?WBC 4.4 4.8 5.3 4.7 7.0  ?NEUTROABS 3.0 3.2  --  3.2 6.0  ?HGB 10.9* 11.0* 11.2* 10.6* 11.0*  ?HCT 35.4* 35.2* 36.0* 35.3* 35.8*  ?MCV 98.1 97.2 96.8 99.7 98.9  ?PLT 99* 98* 103* 94* 120*  ? ? ?Blood Culture ?   ?Component Value Date/Time  ? SDES BLOOD RIGHT ANTECUBITAL 03/09/2021 1601  ? SDES BLOOD LEFT ANTECUBITAL 03/09/2021 1601  ? SPECREQUEST  03/09/2021 1601  ?  BOTTLES DRAWN AEROBIC ONLY Blood Culture adequate volume  ? SPECREQUEST  03/09/2021 1601  ?  BOTTLES DRAWN AEROBIC ONLY Blood Culture results may not be optimal due to an inadequate volume of blood received in culture bottles  ? CULT  03/09/2021 1601  ?  NO GROWTH 5  DAYS ?Performed at [facility], [place] [address], [place], [place] [address] ?  ? CULT  03/09/2021 1601  ?  NO GROWTH 5 DAYS ?Performed at [facility], [place] [address], [place], [place] [address] ?  ? REPTSTATUS 03/14/2021 FINAL 03/09/2021 1601  ? REPTSTATUS 03/14/2021 FINAL 03/09/2021 1601  ? ? ?Cardiac Enzymes: ?No results for input(s): CKTOTAL, CKMB, CKMBINDEX, TROPONINI in the last 168 hours. ?CBG: ?No results for input(s): GLUCAP in the last 168 hours. ?Iron Studies: No results for input(s): IRON, TIBC, TRANSFERRIN, FERRITIN in the last 72 hours. ?@lablastinr3 @ ?Studies/Results: ?No results found. ?Medications: ? ? (feeding supplement) PROSource Plus  30 mL Oral BID BM  ? acetaminophen  325 mg Oral Q6H  ? aspirin EC  81 mg Oral Daily  ? calcium acetate  2,668 mg Oral TID WC  ? Chlorhexidine Gluconate Cloth  6 each Topical Q0600  ? cinacalcet  30 mg Oral Q M,W,F-HD  ? clopidogrel  75 mg Oral Daily  ? doxercalciferol  4 mcg Intravenous Q M,W,F-HD  ? escitalopram  5 mg Oral Daily  ? famotidine  40 mg Oral Daily  ? furosemide  80 mg Oral Once per day on Sun Tue Thu  Sat  ? heparin  5,000 Units Subcutaneous Q8H  ? levothyroxine  137 mcg Oral QAC breakfast  ? lidocaine  1 patch Transdermal Q24H  ? midodrine  15 mg Oral TID WC  ? polyethylene glycol  17 g Oral Daily  ? rosuvastatin  10 mg Oral QHS  ? zolpidem  10 mg Oral QHS  ? ? ? ?OP HD:  MWF [facility] ? 4h 37min  350/800  118kg  2/2 bath   Hep 3700 ?Mircera 50 last given 4/12 ?Hectorol 56mcg IV qHD ?Sensipar 90mg   ?Phoslo 667mg  4 capsules with meals and 3 with snacks ?  ?Assessment/Plan: ?1. Severe aortic stenosis, dyspnea/CP: Cardiology following, not a surgical candidate due to comorbidities. Palliative care involved. Specifically asked to have HD 05/02/2021 which has been ordered. Considering hospice strongly. He has been on HD for 26 years, down to last possible HD access which a very precarious femoral TDC. He is tired. Now DNR.  ?2. ESRD: Continue  HD q MWF for now. Femoral TDC requires Cathflo q HD. HD today.  ?3. Hypotension/volume: Chronically low BP, started on midodrine 10mg  TID. Pain medications probably contributing to hypotension. UF as tolerated.  ?4. Anemia of ESRD: Hgb 10.6. no ESA for now. ?5. Secondary hyperparathyroidism: Ca/Phos to goal, continue Phoslo, VDRA, sensipar. ?6. Nutrition: Alb 3.1 - start supplements. ?7. Suicidal ideation: Multiple social issues. Palliative care and SW following. ?8. Nausea: Chronic with HD - prn meds. ?9. DNR. Now leaning toward going to hospice. Appreciate assistance and guidance from Palliative Care and their continued compassionate support of this patient.  ?10. Chronic hyperkalemia: Usually takes Kayelexate over W/E as OP. He refuses Lokelma, says it "doesn't work". K+ 6's this am, use 1K bath for 2 hrs.  ? ?[name], MD ?05/02/2021, 11:27 AM ? ? ? ? ? ?  ? ?

## 2021-05-03 DIAGNOSIS — I959 Hypotension, unspecified: Secondary | ICD-10-CM

## 2021-05-03 DIAGNOSIS — I350 Nonrheumatic aortic (valve) stenosis: Secondary | ICD-10-CM | POA: Diagnosis not present

## 2021-05-03 DIAGNOSIS — R079 Chest pain, unspecified: Secondary | ICD-10-CM

## 2021-05-03 DIAGNOSIS — F329 Major depressive disorder, single episode, unspecified: Secondary | ICD-10-CM

## 2021-05-03 DIAGNOSIS — R9389 Abnormal findings on diagnostic imaging of other specified body structures: Secondary | ICD-10-CM | POA: Diagnosis not present

## 2021-05-03 DIAGNOSIS — N189 Chronic kidney disease, unspecified: Secondary | ICD-10-CM | POA: Diagnosis not present

## 2021-05-03 DIAGNOSIS — Z515 Encounter for palliative care: Secondary | ICD-10-CM

## 2021-05-03 MED ORDER — HYDROMORPHONE HCL 1 MG/ML IJ SOLN
1.0000 mg | INTRAMUSCULAR | Status: DC | PRN
  Administered 2021-05-05: 1 mg via INTRAVENOUS
  Filled 2021-05-03: qty 1

## 2021-05-03 MED ORDER — PROCHLORPERAZINE EDISYLATE 10 MG/2ML IJ SOLN
10.0000 mg | INTRAMUSCULAR | Status: DC
  Administered 2021-05-03 – 2021-05-05 (×8): 10 mg via INTRAVENOUS
  Filled 2021-05-03 (×7): qty 2

## 2021-05-03 NOTE — TOC Progression Note (Signed)
Transition of Care (TOC) - Progression Note  ? ? ?Patient Details  ?Name: [name] ?MRN: [id number] ?Date of Birth: 10-17-1965 ? ?Transition of Care (TOC) CM/SW Contact  ?Benard Halsted, LCSW ?Phone Number: ?05/03/2021, 11:36 AM ? ?Clinical Narrative:    ?CSW received consult for residential hospice placement. Patient states preference for [facility]. CSW sent referral to [facility] for review.  ? ? ?Expected Discharge Plan: [place] ?Barriers to Discharge: Hospice Bed not available ? ?Expected Discharge Plan and Services ?Expected Discharge Plan: [place] ?In-house Referral: Clinical Social Work ?  ?  ?Living arrangements for the past 2 months: [place], Hotel[employer]el ?                ?  ?  ?  ?  ?  ?  ?  ?  ?  ?  ? ? ?Social Determinants of Health (SDOH) Interventions ?  ? ?Readmission Risk Interventions ? ?  04/28/2021  ?  2:08 PM 03/11/2021  ?  2:57 PM  ?Readmission Risk Prevention Plan  ?Transportation Screening Complete Complete  ?PCP or Specialist Appt within 3-5 Days  Complete  ?[place] or Home Care Consult  Complete  ?Social Work Consult for [place]/Counseling  Complete  ?Palliative Care Screening  Not Applicable  ?Medication Review [employer]) Complete Complete  ?PCP or Specialist appointment within 3-5 days of discharge Complete   ?[place] or Home Care Consult Complete   ?SW Recovery Care/Counseling Consult Complete   ?Palliative Care Screening Complete   ?Skilled Nursing Facility Complete   ? ? ?

## 2021-05-03 NOTE — Progress Notes (Signed)
?                                                                                                                                                     ?                                                   ?Daily Progress Note  ? ?Patient Name: [name]       Date: 05/03/2021 ?DOB: 27-Mar-1965  Age: 56 y.o. MRN#: [id number] ?Attending Physician: Antonieta Pert, MD ?Primary Care Physician: [name], MD ?Admit Date: 04/22/2021 ? ?Reason for Consultation/Follow-up: Non pain symptom management, Pain control, Psychosocial/spiritual support, and Terminal Care ? ?Subjective: ?Chart review performed.  Received report from primary RN -no acute concerns.  Reports patient continues to have complaints of pain and nausea.  Patient's oral intake is minimal today-bites and sips. ? ?Went to visit patient at bedside -no family/visitors present.  Patient was lying in bed asleep - he does wake to voice/gentle touch.  He is able to have meaningful conversation but does intermittently fall asleep; however, is easily arousable.  Nonverbal gestures of discomfort noted. No respiratory distress, increased work of breathing, or secretions noted.  Patient is on 2 L O2 nasal cannula. ? ?Patient reports "not feeling too good."  He still has complaints of generalized pain and nausea.  Discussed symptom management plan of increasing continuous Dilaudid drip as well as scheduling antiemetic.  Patient is not able to tell me which has a better effect Compazine versus Zofran.  Provided 1 mg bolus dose of Dilaudid while in room and increased basal rate to 1 mg/h. ? ?Discussed symptom management plan with primary RN. ? ?12:20 PM ?Called patient's friend/Carol -provided updates per RN and my assessment today. All questions and concerns addressed. Encouraged to call with questions and/or concerns. PMT number previously provided. ? ?3:40 PM ?Called patient's sister/Betty -provided updates per RN and my assessment today. Notified of likely transfer to  [facility]. Updated that patient would want her to get his jewelry/belongings. All questions and concerns addressed. Encouraged to call with questions and/or concerns. PMT number previously provided. ? ?4:16 PM ?Received notification that patient's friend/Eric requested PMT return call with updates. ? ?Called patient's Randall Hiss - provided updates per RN and my assessment today. Updated on current acute medical situation. Prognostication reviewed. He expresses support of patient's decision for comfort/hospice stating "he's had a hard life." All questions and concerns addressed. Encouraged to call with questions and/or concerns. PMT number previously provided. ? ? ?Length of Stay: 8 ? ?Current Medications: ?Scheduled Meds:  ? antiseptic oral rinse  15 mL Topical BID  ? prochlorperazine  10 mg Intravenous  Q4H  ? ? ?Continuous Infusions: ? HYDROmorphone 0.5 mg/hr (05/02/21 1349)  ? ? ?PRN Meds: ?acetaminophen **OR** acetaminophen, diphenhydrAMINE, fluticasone, glycopyrrolate **OR** glycopyrrolate **OR** glycopyrrolate, haloperidol **OR** haloperidol **OR** haloperidol lactate, HYDROmorphone (DILAUDID) injection, LORazepam **OR** LORazepam **OR** LORazepam, ondansetron (ZOFRAN) IV, polyvinyl alcohol, prochlorperazine **OR** prochlorperazine **OR** prochlorperazine ? ?Physical Exam ?Vitals and nursing note reviewed.  ?Constitutional:   ?   General: He is not in acute distress. ?   Appearance: He is obese. He is ill-appearing.  ?Pulmonary:  ?   Effort: No respiratory distress.  ?Skin: ?   General: Skin is warm and dry.  ?Neurological:  ?   Mental Status: He is oriented to person, place, and time. He is lethargic.  ?   Motor: Weakness present.  ?Psychiatric:     ?   Attention and Perception: Attention normal.     ?   Speech: Speech is delayed.     ?   Behavior: Behavior is cooperative.     ?   Cognition and Memory: Cognition and memory normal.  ?         ? ?Vital Signs: BP (!) 96/57 (BP Location: Right Arm)   Pulse  72   Temp 98.3 ?F (36.8 ?C) (Oral)   Resp 18   Ht 5\' 11"  (1.803 m)   Wt 118.4 kg   SpO2 92%   BMI 36.41 kg/m?  ?SpO2: SpO2: 92 % ?O2 Device: O2 Device: Nasal Cannula ?O2 Flow Rate: O2 Flow Rate (L/min): 2 L/min ? ?Intake/output summary:  ?Intake/Output Summary (Last 24 hours) at 05/03/2021 1320 ?Last data filed at 05/03/2021 0600 ?Gross per 24 hour  ?Intake 14.21 ml  ?Output 0 ml  ?Net 14.21 ml  ? ?LBM: Last BM Date : 04/27/21 ?Baseline Weight: Weight: 123 kg ?Most recent weight: Weight: 118.4 kg ? ?     ?Palliative Assessment/Data: PPS 20% ? ? ? ? ? ?Patient Active Problem List  ? Diagnosis Date Noted  ? End of life care 05/03/2021  ? Major depressive disorder with current active episode   ? Hypotension 03/07/2021  ? Goals of care, counseling/discussion   ? 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 Abscess of left thigh 01/28/2020  ? Coronary artery calcification   ? Angina at rest [facility])   ? Complication of vascular access for dialysis   ? Malfunction of nephrostomy tube ([place])   ? Complication of nephrostomy ([name]) 03/20/2019  ? Abnormal CT scan, gastrointestinal tract   ? Hydronephrosis with renal and ureteral calculus obstruction 11/26/2018  ? Mild protein-calorie malnutrition ([place]) 04/24/2018  ? Class 2 severe obesity due to excess calories with serious comorbidity and body mass index (BMI) of 37.0 to 37.9 in adult [facility]) 02/26/2018  ? PAD (peripheral artery disease) ([place]) 12/22/2017  ? Anxiety 12/21/2017  ? Left great toe amputee ([place]) 12/19/2017  ? Endocarditis of mitral valve   ? ESRD (end stage renal disease) on dialysis ([place]) 08/07/2017  ? Severe aortic stenosis 02/26/2017  ? Nonhealing surgical wound 02/29/2016  ? Epilepsy ([place]) 12/30/2012  ? Complication of AV dialysis fistula 06/27/2012  ? End stage renal disease ([place]) 06/27/2012  ? Personal history of Methicillin resistant Staphylococcus aureus infection 11/17/2008  ? Anemia in chronic kidney disease 10/01/2007  ? Essential (primary)  hypertension 10/01/2007  ? ? ?Palliative Care Assessment & Plan  ? ?Patient Profile: ?Berthold Glace is a 56 y.o. male with medical history significant of severe aortic stenosis, ESRD on HD, recurrent kidney stone, spinal  bifida, neurogenic bladder status post nephrostomy, recurrent UTI, CAD, hypothyroidism, chronic pain and narcotic dependence, presented with anginal-like chest pain and near syncope's. ?  ?PMT has been consulted to assist with goals of care conversation. ? ?Assessment: ?Principal Problem: ?  Severe aortic stenosis ?Active Problems: ?  End stage renal disease ([place]) ?  Left great toe amputee ([place]) ?  PAD (peripheral artery disease) ([place]) ?  Complication of nephrostomy ([place]) ?  Malfunction of nephrostomy tube ([place]) ?  Complication of vascular access for dialysis ?  Angina at rest [facility]) ?  Coronary artery calcification ?  Goals of care, counseling/discussion ?  Anemia in chronic kidney disease ?  Class 2 severe obesity due to excess calories with serious comorbidity and body mass index (BMI) of 37.0 to 37.9 in adult [facility]) ?  Essential (primary) hypertension ?  Mild protein-calorie malnutrition ([place]) ?  Personal history of Methicillin resistant Staphylococcus aureus infection ?  Hypotension ?  Major depressive disorder with current active episode ?  End of life care ? ? ?Recommendations/Plan: ?Continue full comfort measures ?Continue DNR/DNI as previously documented ?Transfer to [facility] when bed available - evaluation pending ?Increased basal rate of Dilaudid continuous infusion to 1mg /h and provided dose range for RN to adjust per orders; increased PRN dose and frequency for breakthrough symptoms ?Scheduled Compazine every 4 hours; continue PRN for breakthrough symptoms ?PMT will continue to follow and support holistically ? ?Symptom Management ?Continuous Dilaudid infusion with bolus doses as needed for breakthrough symptoms ?Tylenol PRN pain/fever ?Biotin twice daily ?Benadryl PRN  itching ?Robinul PRN secretions ?Haldol PRN agitation/delirium ?Ativan PRN anxiety/seizure/sleep/distress ?Zofran PRN nausea/vomiting ?Compazine PRN nausea/vomiting ?Liquifilm Tears PRN dry eye ? ?Goals of Care and Additional Recomm

## 2021-05-03 NOTE — Consult Note (Signed)
Brief Psychiatry Consult Note  ?The patient was last seen by the psychiatry service on 05/03/2021. Interim documentation by primary team and nursing staff has been reviewed. At this time, patient has moved to comfort care and there is no evidence of acute psychiatric disturbance requiring ongoing psychiatric consultation; worth noting he denied SI on multiple evaluations prior to this decision. Please see last consult note for full assessment - recommendations from that note were tied to optimizing sensorium and not applicable to current situation. Final medication recommendations are as follows: ? ?- c escitalopram 5 mg.  ? ?We will sign off at this time. This has been communicated to the primary team. If issues arise in the future, don't hesitate to reconsult the Psychiatry Inpatient Consult Service.  ? ?Joycelyn Schmid A Glenys Snader ? ?

## 2021-05-03 NOTE — Progress Notes (Signed)
Pt seen in room. Asking for stronger pain med, will d/w pmd.  Pt on comfort care/ hospice care now, no further dialysis. No further suggestions, will sign off.  ? ?[name], MD ?05/03/2021, 11:11 AM ? ? ? ? ?

## 2021-05-03 NOTE — Progress Notes (Signed)
?PROGRESS NOTE ?[name]  EYC:[id number] DOB: 09-Jan-1965 DOA: 04/22/2021 ?PCP: [name], MD  ? ?Brief Narrative/Hospital Course: ?56 y.o m w/ significant history of severe aortic stenosis, ESRD on HD, recurrent kidney stone, spinal bifida, neurogenic bladder s/p nephrostomy, recurrent UTI, CAD, hypothyroidism, chronic pain and narcotic dependence, who presented with anginal-like chest pain and dizziness/lightheadedness.  He was also having a lot of stress lately due to being homeless and also dealing with chronic medical issues and difficulty with dialysis over the years. ?He had presented to the ER initially on 04/22/2021 and voiced some suicidal ideations and had been placed under IVC however he states these were situational from his stress and current acute illness of feeling poorly.After further evaluation and monitoring he was felt to be no further self-harm and had no further ideations.He was admitted for evaluations with cardiology due to concern for worsening symptomatic aortic stenosis.  Seen by cardiology for severe symptomatic aortic stenosis and patient is not considered a candidate for TAVR due to multiple medical comorbidities, severe PAD and poor social resources.  Extensive discussion and further meeting with patient's along with cardiology nephrology palliative care psychiatry.  Subsequently patient has been taken off dialysis, transition to comfort measures 05/02/21.  ?  ?Subjective: ?Seen and examined this morning.  Alert awake appears depressed complains of nausea and pain. ?  ?Assessment and Plan: ?Principal Problem: ?  Severe aortic stenosis ?Active Problems: ?  End stage renal disease ([place]) ?  Left great toe amputee ([place]) ?  PAD (peripheral artery disease) ([place]) ?  Complication of nephrostomy ([place]) ?  Malfunction of nephrostomy tube ([place]) ?  Complication of vascular access for dialysis ?  Angina at rest [facility]) ?  Coronary artery calcification ?  Goals of care, counseling/discussion ?   Anemia in chronic kidney disease ?  Class 2 severe obesity due to excess calories with serious comorbidity and body mass index (BMI) of 37.0 to 37.9 in adult [facility]) ?  Essential (primary) hypertension ?  Mild protein-calorie malnutrition ([place]) ?  Personal history of Methicillin resistant Staphylococcus aureus infection ?  Hypotension ?  Major depressive disorder with current active episode ?  End of life care ? ?End-of-life care/goals of care: ?Extensive discussion throughout the hospital stay multiple providers were involved including psychiatric cardiology nephrology, Dr Sabino Gasser along with palliative care.  Due to worsening functional status progressive decline along with great difficulty with dialysis over the years and IV access in the setting of severe aortic stenosis, hypotension along with significant other medical illness patient transition to comfort measures after further discussion with the patient.  Continue current meds for his comfort, palliative care following.  Complains of ongoing pain and nausea.  Goal residential hospice if able if not would remain inpatient ? ?Severe symptomatic aortic stenosis-seen by cardiology not a candidate for TAVR ? ?ESRD on HD: HD has been discontinued 05/02/21 as it is not safe given patient's severe hypotension and poor quality of life ? ?Hypotension precluding further safe dialysis ?Suicidal ideation resolved seen by psychiatry off one-to-one ?Headache resolved CT head no acute finding ?Chronic anemia of CKD ?CAD ?Hypothyroidism ?PAD  ?Coronary artery calcification ?Class 2 severe obesity due to excess calories with serious comorbidity and body mass index (BMI) of 37.0 to 37.9 in adult [facility]) ?Mild protein-calorie malnutrition ([place]) ?Personal history of Methicillin resistant Staphylococcus aureus infection ? ? ?DVT prophylaxis:   None for comfort ?Code Status:   Code Status: DNR ?Family Communication: plan of care discussed with patient at bedside. ?  Patient status is:  Inpatient level of care: Telemetry Medical  ?Remains inpatient because: Ongoing comfort measures ?Patient currently not stable ? ?Dispo: The patient is from: homeless ?           Anticipated disposition: Inpatient hospice ? ?Mobility Assessment (last 72 hours)   ? ? Mobility Assessment   ? ? [place] Name 04/30/21 1950  ?  ?  ?  ?  ? Does patient have an order for bedrest or is patient medically unstable No - Continue assessment      ? What is the highest level of mobility based on the progressive mobility assessment? Level 4 (Walks with assist in room) - Balance while marching in place and cannot step forward and back - Complete      ? ?  ?  ? ?  ?  ? ?Objective: ?Vitals last 24 hrs: ?Vitals:  ? 05/02/21 0900 05/02/21 1216 05/02/21 1954 05/03/21 0755  ?BP: (!) 68/32 (!) 91/56 (!) 68/29 (!) 96/57  ?Pulse: 78 85 72 72  ?Resp:  15 15 18   ?Temp:  98.5 ?F (36.9 ?C) (!) 97.2 ?F (36.2 ?C) 98.3 ?F (36.8 ?C)  ?TempSrc:  Oral Oral Oral  ?SpO2: (!) 73% 92%    ?Weight:      ?Height:      ? ?Weight change:  ? ?Physical Examination: ?General exam: Alert awake, obese, chronically sick looking, disabled, laying in the bed older than stated age, weak appearing. ?HEENT:Oral mucosa moist, Ear/Nose WNL grossly, dentition normal. ?Respiratory system: bilaterally diminished BS, no use of accessory muscle ?Cardiovascular system: S1 & S2 +, No JVD,. ?Gastrointestinal system: Abdomen soft,NT,ND, BS+ ?Nervous System:Alert, awake, moving extremities and grossly nonfocal ?Extremities: LE edema +,distal peripheral pulses palpable.  ?Skin: No rashes,no icterus.  Excoriation marks and old fistula present ?MSK: Normal muscle bulk,tone, power ? ?Medications reviewed:  ?Scheduled Meds: ? antiseptic oral rinse  15 mL Topical BID  ? ?Continuous Infusions: ? HYDROmorphone 0.5 mg/hr (05/02/21 1349)  ? ? ?  ?Diet Order   ? ?       ?  Diet regular Room service appropriate? Yes; Fluid consistency: Thin  Diet effective now       ?  ? ?  ?  ? ?  ?  ? ?  ?  ?   ? ? ?Intake/Output Summary (Last 24 hours) at 05/03/2021 1123 ?Last data filed at 05/03/2021 0600 ?Gross per 24 hour  ?Intake 14.21 ml  ?Output 0 ml  ?Net 14.21 ml  ? ?Net IO Since Admission: -2,145.79 mL [05/03/21 1123]  ?Wt Readings from Last 3 Encounters:  ?04/29/21 118.4 kg  ?03/16/21 123 kg  ?05/24/20 122.9 kg  ?  ? ?Unresulted Labs (From admission, onward)  ? ?  Start     Ordered  ? Signed and Held  Renal function panel  Once,   R       ?Question:  Specimen collection method  Answer:  Lab=Lab collect  ? Signed and Held  ? Signed and Held  CBC  Once,   R       ?Question:  Specimen collection method  Answer:  Lab=Lab collect  ? Signed and Held  ? Signed and Held  Hepatitis B surface antigen  (New Admission Hemo Labs (Hepatitis B))  Once,   R       ?Question:  Specimen collection method  Answer:  Lab=Lab collect  ? Signed and Held  ? Signed and Held  Hepatitis B surface antibody  (New  Admission Hemo Labs (Hepatitis B))  Once,   R       ?Question:  Specimen collection method  Answer:  Lab=Lab collect  ? Signed and Held  ? Signed and Held  Hepatitis B surface antibody,quantitative  (New Admission Hemo Labs (Hepatitis B))  Once,   R       ?Question:  Specimen collection method  Answer:  Lab=Lab collect  ? Signed and Held  ? ?  ?  ? ?  ?Data Reviewed: I have personally reviewed following labs and imaging studies ?CBC: ?Recent Labs  ?Lab 05/01/2021 ?0128 04/28/21 ?0134 04/28/21 ?1115 04/29/21 ?0301 05/02/21 ?0225  ?WBC 4.4 4.8 5.3 4.7 7.0  ?NEUTROABS 3.0 3.2  --  3.2 6.0  ?HGB 10.9* 11.0* 11.2* 10.6* 11.0*  ?HCT 35.4* 35.2* 36.0* 35.3* 35.8*  ?MCV 98.1 97.2 96.8 99.7 98.9  ?PLT 99* 98* 103* 94* 120*  ? ?Basic Metabolic Panel: ?Recent Labs  ?Lab May 01, 2021 ?0128 04/28/21 ?0134 04/28/21 ?1115 04/29/21 ?0301 05/01/21 ?0101 05/02/21 ?0225  ?NA 136 135 135 135 136 134*  ?K 5.2* 4.6 4.4 5.4* 4.9 6.6*  ?CL 99 99 97* 99 98 96*  ?CO2 29 28 26 24 27 24   ?GLUCOSE 82 126* 129* 80 115* 95  ?BUN 44* 37* 43* 54* 57* 81*  ?CREATININE 9.52*  7.48* 8.33* 9.77* 9.00* 11.07*  ?CALCIUM 8.6* 8.6* 8.7* 8.9 9.0 9.1  ?MG 2.2 2.1  --  2.2  --  2.3  ?PHOS 5.1* 4.4 4.2 4.8* 4.3 4.3  ? ?GFR: ?Estimated Creatinine Clearance: 9.9 mL/min (A) (by C-G formula ba

## 2021-05-03 NOTE — Progress Notes (Addendum)
[employer]ufacturing [employer] [facility]) Hospital Liaison Note ? ?Referral received for patient/family interest in [facility]. Chart under review by [facility] physician.  ? ?Hospice eligibility confirmed.  ? ?Unfortunately, we are not able to offer a bed for today. We can offer a bed for tomorrow.  ? ?Please call with any questions or concerns. Thank you ? ?Roselee Nova, LCSW ?[place] Hospital Liaison ?[phone] ?

## 2021-05-03 NOTE — Hospital Course (Signed)
56 y.o m w/ significant history of severe aortic stenosis, ESRD on HD, recurrent kidney stone, spinal bifida, neurogenic bladder s/p nephrostomy, recurrent UTI, CAD, hypothyroidism, chronic pain and narcotic dependence, who presented with anginal-like chest pain and dizziness/lightheadedness.  He was also having a lot of stress lately due to being homeless and also dealing with chronic medical issues and difficulty with dialysis over the years. ?He had presented to the ER initially on 04/22/2021 and voiced some suicidal ideations and had been placed under IVC however he states these were situational from his stress and current acute illness of feeling poorly.After further evaluation and monitoring he was felt to be no further self-harm and had no further ideations.He was admitted for evaluations with cardiology due to concern for worsening symptomatic aortic stenosis.  Seen by cardiology for severe symptomatic aortic stenosis and patient is not considered a candidate for TAVR due to multiple medical comorbidities, severe PAD and poor social resources.  Extensive discussion and further meeting with patient's along with cardiology nephrology palliative care psychiatry.  Subsequently patient has been taken off dialysis, transition to comfort measures 05/02/21. ?

## 2021-05-04 DIAGNOSIS — I350 Nonrheumatic aortic (valve) stenosis: Secondary | ICD-10-CM | POA: Diagnosis not present

## 2021-05-04 MED ORDER — PROCHLORPERAZINE MALEATE 10 MG PO TABS: 10.0000 mg | ORAL_TABLET | Freq: Four times a day (QID) | ORAL | 0 refills | Status: AC | PRN

## 2021-05-04 MED ORDER — LORAZEPAM 1 MG PO TABS: 1.0000 mg | ORAL_TABLET | ORAL | 0 refills | Status: AC | PRN

## 2021-05-04 MED ORDER — SODIUM CHLORIDE 0.9 % IV SOLN: 1.0000 mg/h | INTRAVENOUS | 0 refills | Status: AC

## 2021-05-04 NOTE — Progress Notes (Signed)
Initially called nurse about 1220 regarding pulling PICC line. At the time nurse declined to have [place] come. Wanted to continue infusions until PTAR time was scheduled for arrival. Followed up again at this time 1638, PT[facility] has not given an arrival time. Spoke with Su Grand,  at this time wants to continue with the PICC line and continue IV infusions. Instructed John RN to place VAST consult when ready for PICC line to be removed. VU. Fran Lowes, RN VAST ?

## 2021-05-04 NOTE — Progress Notes (Signed)
Patient [name]      DOB: 1965/11/02      QJJ:[id number] ? ? ? ?  ?Palliative Medicine Team ? ? ? ?Subjective: Bedside symptom check completed.  ? ? ?Physical exam: Patient resting in bed with eyes closed at time of visit. Breathing even and non-labored, no excessive secretions noted. Patient without physical or non-verbal signs of pain or discomfort at this time. This RN did not attempt to wake the patient, to preserve comfort measures. ? ? ?Assessment and plan: Bedside RN Jenny Reichmann without any needs or concerns at this time. Discussed plan for medication bridge to ensure comfort for transport to residential hospice facility. Spoke with attending physician, no needs at this time. Coordinated with hospice liaison regarding available room pending transport. Palliative care NP made aware of assessment and plan. Will continue to follow for any changes or advances. ? ? ?Thank you for allowing the Palliative Medicine Team to assist in the care of this patient. ?  ?  ?Damian Leavell, MSN, RN ?Palliative Medicine Team ?Team Phone: [phone]  ?This phone is monitored 7a-7p, please reach out to attending physician outside of these hours for urgent needs.   ?

## 2021-05-04 NOTE — Progress Notes (Addendum)
Talked with Dr. Inda Merlin whether pt is to go to [facility] with HD cath intact and with Dilaudid gtt infusing or if 1 or both of them need to be dc'd because if HD cath to be dc'd needs to be done at least 30 min before pt. Leaves. Dr. Cyd Silence to write orders. Pt's primary nurse notified. ?

## 2021-05-04 NOTE — Progress Notes (Signed)
This patient is still on the unit awaiting transport. After further review of the chart it was noted that the patient has an HD catheter and not a PICC line. Notified MD to DC PICC line order and put correct HD orders in. This nurse went up to assess patient. At the time of assessment. This nurse found tubing infusing dilaudid to the venous lumen of the HD catheter. No cap or filter tubing present. Also, no order has been found indicating the use of the HD line for infusions. Notified nurse. This nurse put an IV connecter cap to lumen and instructed nurse to change tubing and add filter tubing which was left at bedside. Drsg chg also performed and notified nurse to get order to use HD line for infusion and also to get order regarding HD catheter whether to DC HD line before DC or to DC patient facility with HD catheter in place. Also included information for nurse in secure chat. VU Fran Lowes, RN VAST ?

## 2021-05-04 NOTE — TOC Transition Note (Signed)
Transition of Care (TOC) - CM/SW Discharge Note ? ? ?Patient Details  ?Name: [name] ?MRN: [id number] ?Date of Birth: [birth year] ? ?Transition of Care (TOC) CM/SW Contact:  ?Benard Halsted, LCSW ?Phone Number: ?05/04/2021, 4:28 PM ? ? ?Clinical Narrative:    ?Patient will DC to: [facility] ?Anticipated DC date: 05/04/21 ?Family notified: Friend and sister ?Transport by: Corey Harold ? ? ?Per MD patient ready for DC to [facility]. RN to call report prior to discharge [phone] ). RN, patient, patient's family, and facility notified of DC. Discharge Summary sent to facility. DC packet on chart with signed DNR. Ambulance transport requested for patient.  ? ?CSW will sign off for now as social work intervention is no longer needed. Please consult [place] again if new needs arise. ? ? ? ? ?Final next level of care: [facility] ?Barriers to Discharge: Barriers Resolved ? ? ?Patient Goals and CMS Choice ?Patient states their goals for this hospitalization and ongoing recovery are:: Comfort ?CMS Medicare.gov Compare Post Acute Care list provided to:: Patient ?Choice offered to / list presented to : Patient ? ?Discharge Placement ?  ?           ?  ?Patient to be transferred to facility by: [facility] ?Name of family member notified: Friend ?Patient and family notified of of transfer: 05/04/21 ? ?Discharge Plan and Services ?In-house Referral: Clinical Social Work ?  ?           ?  ?  ?  ?  ?  ?  ?  ?  ?  ?  ? ?Social Determinants of Health (SDOH) Interventions ?  ? ? ?Readmission Risk Interventions ? ?  04/28/2021  ?  2:08 PM 03/11/2021  ?  2:57 PM  ?Readmission Risk Prevention Plan  ?Transportation Screening Complete Complete  ?PCP or Specialist Appt within 3-5 Days  Complete  ?[place] or Home Care Consult  Complete  ?Social Work Consult for [place] Planning/Counseling  Complete  ?Palliative Care Screening  Not Applicable  ?Medication Review [employer] photographer) Complete Complete  ?PCP or Specialist  appointment within 3-5 days of discharge Complete   ?[place] or Home Care Consult Complete   ?SW Recovery Care/Counseling Consult Complete   ?Palliative Care Screening Complete   ?Skilled Nursing Facility Complete   ? ? ? ? ? ?

## 2021-05-04 NOTE — Progress Notes (Signed)
[facility] Hospital Liaison Note   Bed offered and accepted for transfer to [facility] today. Unit RN please call report to [phone] prior to patient leaving the unit. Please send signed DNR and paperwork with patient.    Please call with any questions or concerns. Thank you   Shanita Wicker, LCSW [facility] Hospital Liaison [phone] 

## 2021-05-04 NOTE — Discharge Summary (Addendum)
Physician Discharge Summary  ?[name] EZM:[id number] DOB: 1965-02-11 DOA: 04/22/2021 ? ?PCP: [name], MD ? ?Admit date: 04/22/2021 ?Discharge date: 05/04/2021 ? ?Time spent: 35 minutes ? ?Recommendations for Outpatient Follow-up:  ?[place] for comfort focused care ? ? ?Discharge Diagnoses:  ?Principal Problem: ?  Severe aortic stenosis ?Severe hypotension ?  End stage renal disease ([place]) ?  Left great toe amputee ([place]) ?  PAD (peripheral artery disease) ([place]) ?  Complication of nephrostomy ([place]) ?  Malfunction of nephrostomy tube ([place]) ?  Complication of vascular access for dialysis ?  Angina at rest [facility]) ?  Coronary artery calcification ?  Goals of care, counseling/discussion ?  Anemia in chronic kidney disease ?  Class 2 severe obesity due to excess calories with serious comorbidity and body mass index (BMI) of 37.0 to 37.9 in adult [facility]) ?  Essential (primary) hypertension ?  Mild protein-calorie malnutrition ([place]) ?  Personal history of Methicillin resistant Staphylococcus aureus infection ?  Hypotension ?  Major depressive disorder with current active episode ?  End of life care ? ? ? ?Filed Weights  ? 04/27/21 1234 04/29/21 0800 04/29/21 1224  ?Weight: 117.8 kg 119.5 kg 118.4 kg  ? ? ?History of present illness:  ?[name] is a 56 y.o. male with medical history significant of severe aortic stenosis, ESRD on HD, recurrent kidney stone, spinal bifida, neurogenic bladder status post nephrostomy, recurrent UTI, CAD, hypothyroidism, chronic pain and narcotic dependence, who presented with anginal-like chest pain and dizziness/lightheadedness.  ?He endorsed being under significant amount of stress lately due to now being homeless he says. He also has multiple chronic medical issues including difficulty with dialysis over the years.  ?He had presented to the ER initially on 04/22/2021 and voiced some suicidal ideations and had been placed under IVC however he states these were situational from  his stress and current acute illness of feeling poorly.  After further evaluation and monitoring he was felt to be no further self-harm and had no further ideations. ?  ?He was admitted for evaluations with cardiology due to concern for worsening symptomatic aortic stenosis ? ?Hospital Course:  ? ?Goals of care discussions ?- extensive discussions throughout hospitalization.  He has been closely followed by cardiology, nephrology, psychiatry and palliative care as well.  Due to worsening functional status and progressive decline, worsening severe aortic stenosis, along with great difficulty with dialysis over the years and dialysis as well as IV access issues, decision has not been made for transitioning to total comfort care.  He has been evaluated by psychiatry, palliative care, nephrology, and hospitalist team, all teams were in agreement as well as patient now for full transition to comfort care ?-Plan for residential hospice ?  ?Severe symptomatic aortic stenosis ?-Greatly appreciate cardiology evaluation.  Patient has been having more progressive symptoms including chest pain and near syncope ?- After cardiology discussion, patient not considered candidate for TAVR due to multiple medical comorbidities, severe PAD, hypertension with ESRD ?-Greatly appreciate and agree with palliative care discussions with patient.   ?- no further workup or treatment ?-Plan is now for pure comfort care ?  ?ESRD on HD ?-Nephrology has been following ?- As of 05/02/2021 dialysis no longer being offered as not safe given patient's severe hypotension and poor quality of life.  Pursuit is now for comfort care and residential hospice placement  ?  ?Hypotension ?-Precluding further safe dialysis ?  ?Suicidal ideation - resolved ?-Resolved, no indication for 1:1 monitoring. ?-Has been evaluated by  psychiatry ?  ?Acute headache ?  ? Chronic anemia on CKD ? ?  ?CAD ?  ?Hypothyroidism ? ?  ?  ?Code Status:   Code Status: DNR ?   ? ? ?Discharge Exam: ?Vitals:  ? 05/03/21 1935 05/04/21 0700  ?BP: (!) 74/43 (!) 99/54  ?Pulse: 73 75  ?Resp: 17 18  ?Temp: 98 ?F (36.7 ?C) 97.8 ?F (36.6 ?C)  ?SpO2: 95%   ? ? ?General: Somnolent, poorly arousable, resting comfortably ?Rest of physical exam deferred ? ?Discharge Instructions ? ? ? ?Allergies as of 05/04/2021   ? ?   Reactions  ? Hydrocodone Other (See Comments)  ? Caused involuntary movement and twitching. CANNOT TAKE DUE TO MUSCLE SPASMS AND MUSCLE TREMORS ?Tolerates hydromorphone and oxycodone.  ? Furadantin [nitrofurantoin] Other (See Comments)  ? UNSPECIFIED REACTION   ? Mandelamine [methenamine] Other (See Comments)  ? UNSPECIFIED REACTION   ? Noroxin [norfloxacin] Other (See Comments)  ? UNSPECIFIED REACTION   ? Colace [docusate] Diarrhea, Nausea And Vomiting  ? Elemental Sulfur Cough  ? Childhood reaction - pt could not confirm that it was a cough  ? Sulfa Antibiotics Other (See Comments), Cough  ? Childhood reaction - pt could not confirm that it was a cough  ? ?  ? ?  ?Medication List  ?  ? ?STOP taking these medications   ? ?aspirin EC 325 MG tablet ?  ?calcium acetate 667 MG capsule ?Commonly known as: PHOSLO ?  ?cinacalcet 30 MG tablet ?Commonly known as: SENSIPAR ?  ?clopidogrel 75 MG tablet ?Commonly known as: PLAVIX ?  ?diclofenac Sodium 1 % Gel ?Commonly known as: VOLTAREN ?  ?doxercalciferol 4 MCG/2ML injection ?Commonly known as: HECTOROL ?  ?escitalopram 5 MG tablet ?Commonly known as: LEXAPRO ?  ?famotidine 40 MG tablet ?Commonly known as: PEPCID ?  ?fluticasone 50 MCG/ACT nasal spray ?Commonly known as: FLONASE ?  ?furosemide 80 MG tablet ?Commonly known as: LASIX ?  ?levothyroxine 137 MCG tablet ?Commonly known as: SYNTHROID ?  ?Melatonin 10 MG Tabs ?  ?Nepro Liqd ?  ?oxyCODONE-acetaminophen 10-325 MG tablet ?Commonly known as: PERCOCET ?  ?polyethylene glycol 17 g packet ?Commonly known as: MIRALAX / GLYCOLAX ?  ?promethazine 25 MG tablet ?Commonly known as: PHENERGAN ?   ?rosuvastatin 10 MG tablet ?Commonly known as: CRESTOR ?  ?senna 8.6 MG Tabs tablet ?Commonly known as: SENOKOT ?  ?sodium polystyrene powder ?Commonly known as: KAYEXALATE ?  ?traZODone 100 MG tablet ?Commonly known as: DESYREL ?  ?zolpidem 10 MG tablet ?Commonly known as: AMBIEN ?  ? ?  ? ?TAKE these medications   ? ?acetaminophen 500 MG tablet ?Commonly known as: TYLENOL ?Take 1,000 mg by mouth every 6 (six) hours as needed for mild pain or headache. ?  ?HYDROmorphone 25 mg in sodium chloride 0.9 % 47.5 mL ?Inject 1 mg/hr into the vein continuous. ?  ?LORazepam 1 MG tablet ?Commonly known as: ATIVAN ?Take 1 tablet (1 mg total) by mouth every hour as needed for anxiety, seizure or sleep (distress). ?What changed:  ?when to take this ?reasons to take this ?  ?prochlorperazine 10 MG tablet ?Commonly known as: COMPAZINE ?Take 1 tablet (10 mg total) by mouth every 6 (six) hours as needed for nausea. ?  ? ?  ? ?Allergies  ?Allergen Reactions  ? Hydrocodone Other (See Comments)  ?  Caused involuntary movement and twitching. CANNOT TAKE DUE TO MUSCLE SPASMS AND MUSCLE TREMORS ?Tolerates hydromorphone and oxycodone.  ? Furadantin [Nitrofurantoin] Other (See  Comments)  ?  UNSPECIFIED REACTION   ? Mandelamine [Methenamine] Other (See Comments)  ?  UNSPECIFIED REACTION   ? Noroxin [Norfloxacin] Other (See Comments)  ?  UNSPECIFIED REACTION   ? Colace [Docusate] Diarrhea and Nausea And Vomiting  ? Elemental Sulfur Cough  ?  Childhood reaction - pt could not confirm that it was a cough  ? Sulfa Antibiotics Other (See Comments) and Cough  ?  Childhood reaction - pt could not confirm that it was a cough  ? ? ? ? ?The results of significant diagnostics from this hospitalization (including imaging, microbiology, ancillary and laboratory) are listed below for reference.   ? ?Significant Diagnostic Studies: ?DG Chest 2 View ? ?Result Date: 04/22/2021 ?CLINICAL DATA:  CP EXAM: CHEST - 2 VIEW COMPARISON:  Chest x-ray 03/07/2021  FINDINGS: Query small pericardial effusion on lateral view. Otherwise the heart and mediastinal contours are unchanged. Low lung volumes. Bibasilar atelectasis. No focal consolidation. No pulmonary edema. No pleural effusion.

## 2021-05-04 NOTE — Plan of Care (Signed)

## 2021-05-05 MED ORDER — HEPARIN SODIUM (PORCINE) 1000 UNIT/ML IJ SOLN
3.1000 mL | Freq: Once | INTRAMUSCULAR | Status: AC
  Administered 2021-05-05: 3100 [IU]

## 2021-05-05 NOTE — Progress Notes (Signed)
Consulted to cap and flush blue port R fem HD catheter prior to discharge. No blood return noted, easily flushed, instilled heparin  per protocol. ?

## 2021-05-05 NOTE — Progress Notes (Signed)
Dilaudid(left in IV bag/tubing) 20 ml wasted with Mirella,RN. ?

## 2021-05-05 NOTE — Progress Notes (Signed)
Pt in route to [facility]. Report given to [facility]. Dilaudid 1 mg and compazine 10 mg IV x1. HD cath heplocked per IV team. Receiving nurse made aware.  ?

## 2022-06-30 IMAGING — MR MR KNEE*R* W/O CM
4 of 8 series · 10 of 40 positions shown · non-contrast
Comparison: MRI 12/17/2019

CLINICAL DATA: End-stage renal disease on hemodialysis. Right knee
pain, instability and swelling. History of hemarthrosis. Recent
arthroscopic knee surgery.

EXAM:
MRI OF THE RIGHT KNEE WITHOUT CONTRAST
TECHNIQUE: Multiplanar, multisequence MR imaging of the knee was performed. No
intravenous contrast was administered.

[Series 3: T2 fat-sat · axial · 4.0mm · 0.35mm/px · z∈[-47,+81]mm · 3 of 33 slices shown (1 of 2)]
[im 7/33]
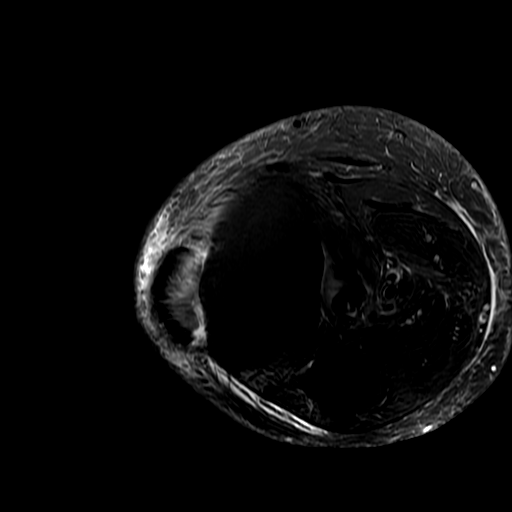
[im 20/33]
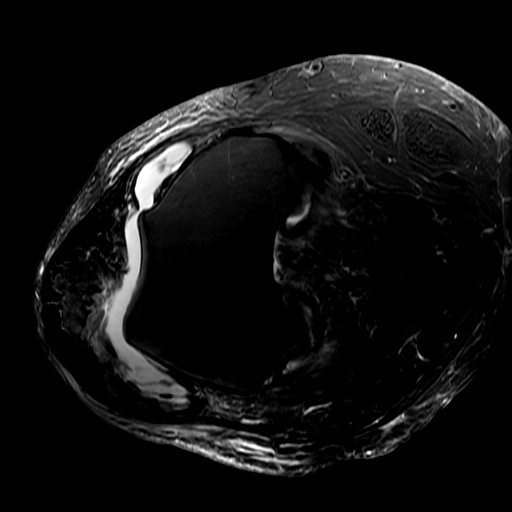
[im 33/33]
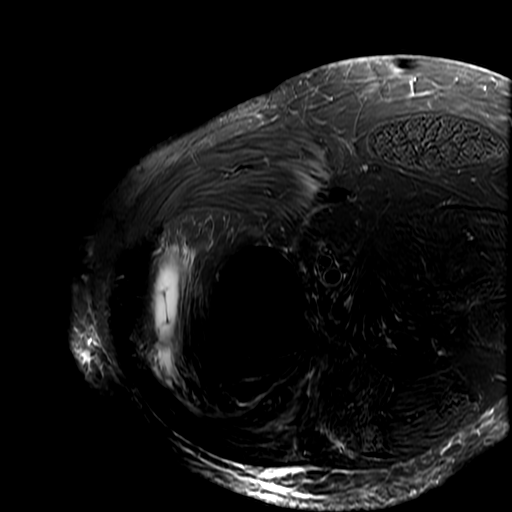

[Series 5: PD fat-sat · oblique · 4.0mm · 0.18mm/px · 3 of 25 slices shown (1 of 2)]
[im 1/25]
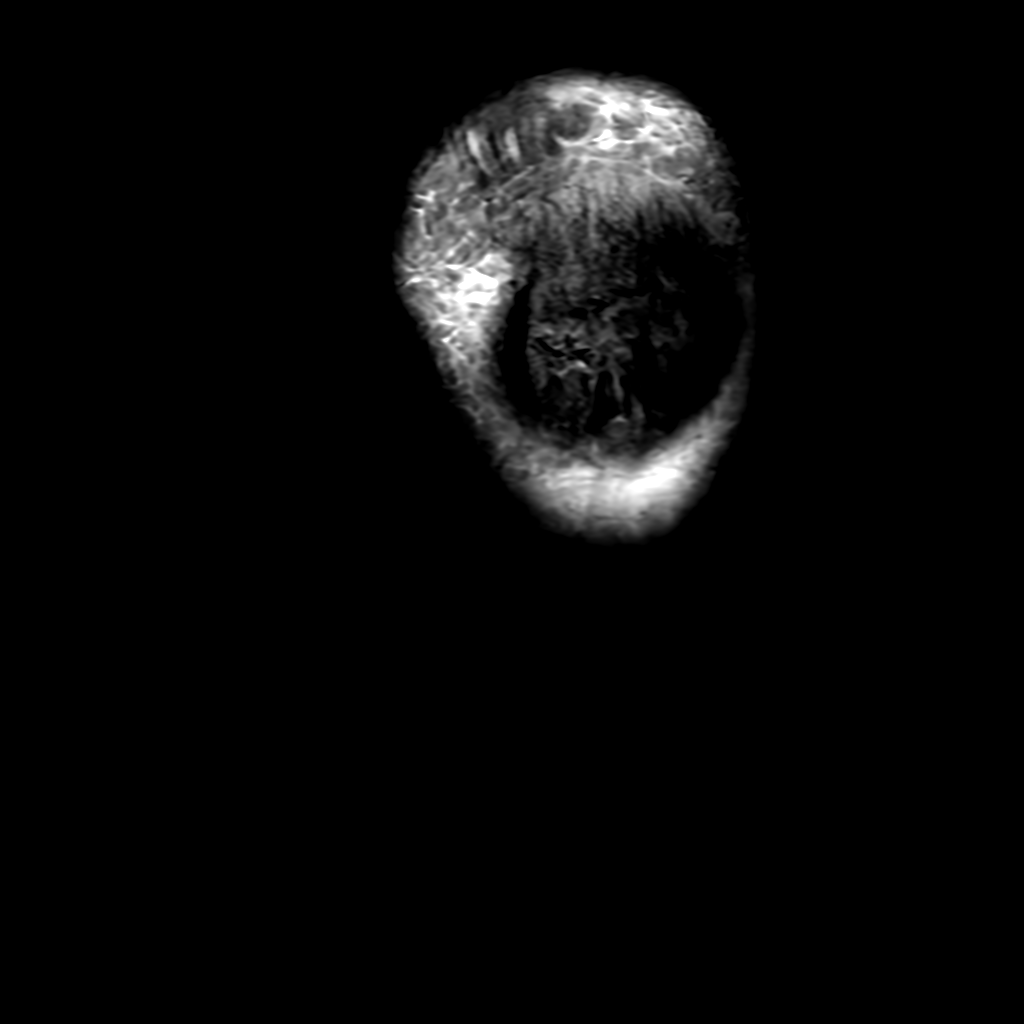
[im 13/25]
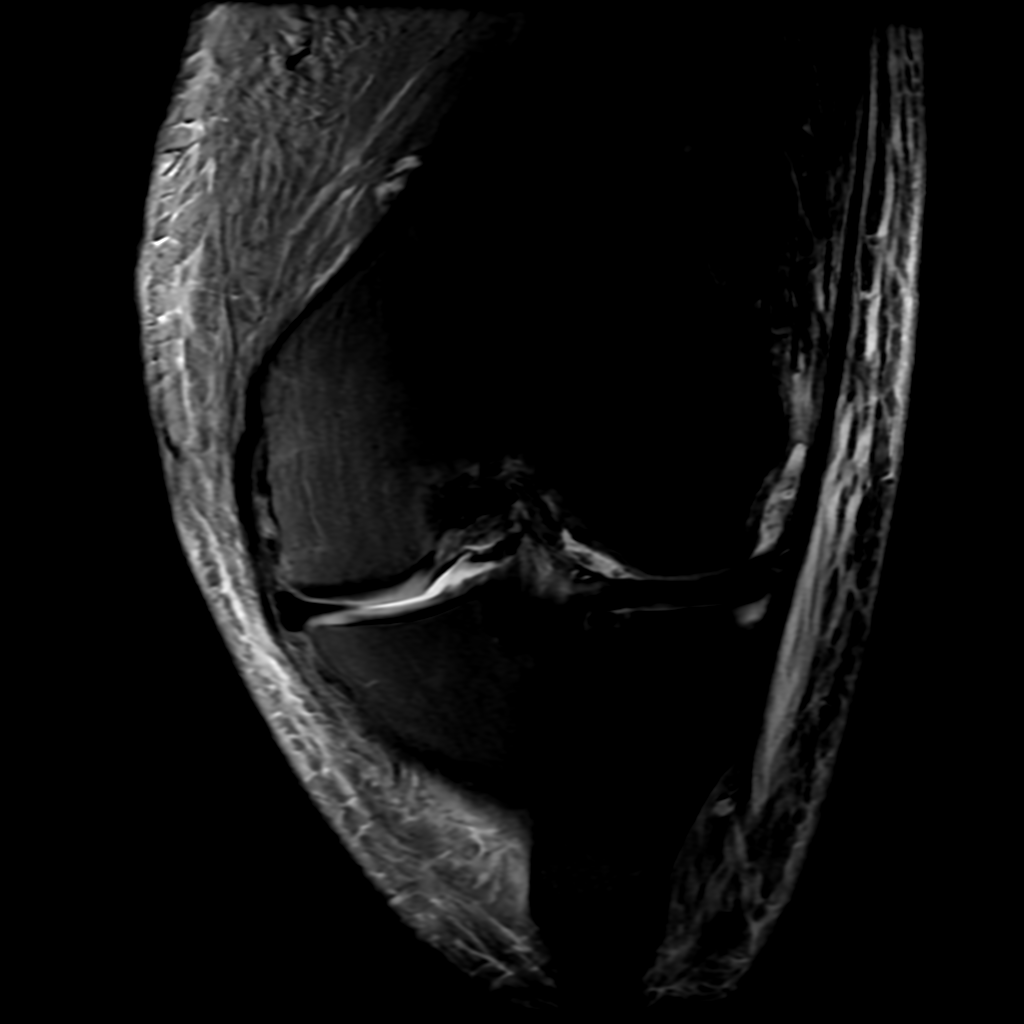
[im 25/25]
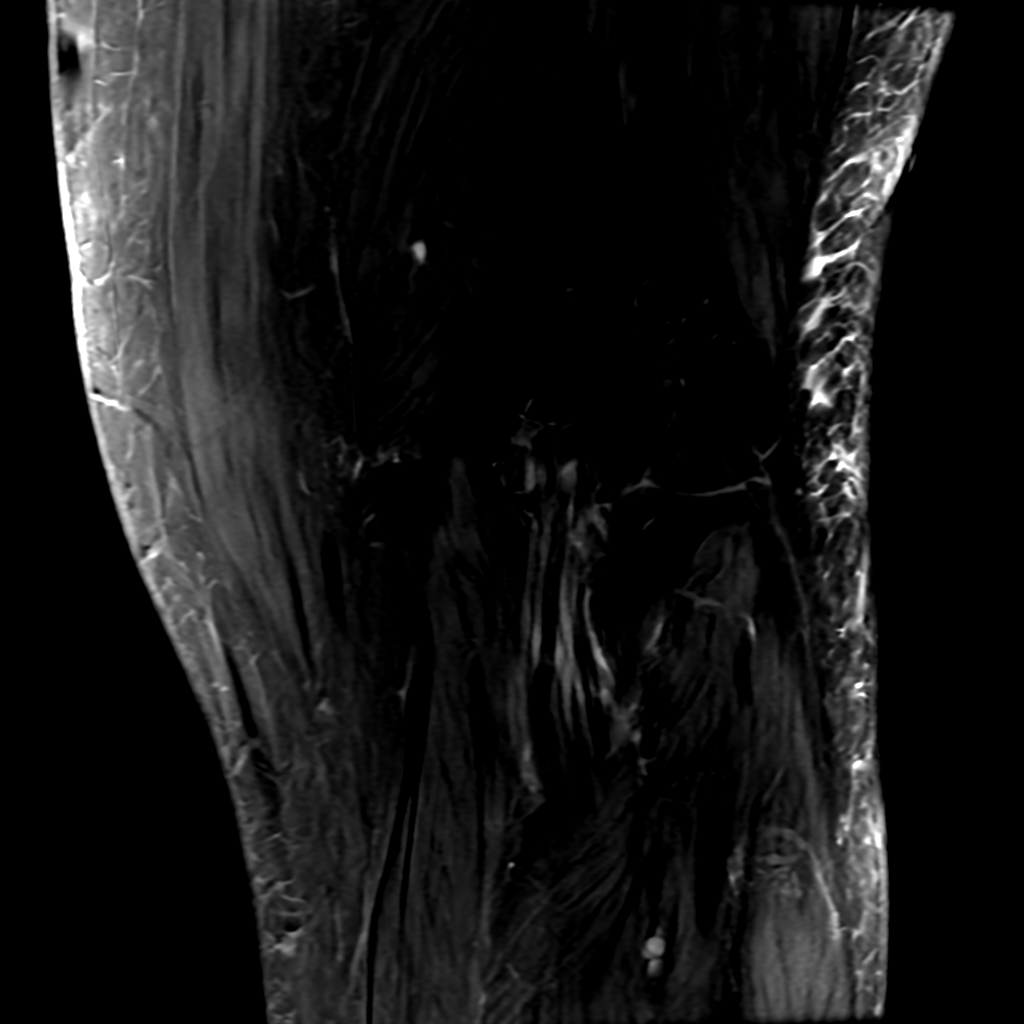

[Series 6: PD fat-sat · oblique · 3.0mm · 0.33mm/px · 3 of 33 slices shown (2 of 2)]
[im 7/33]
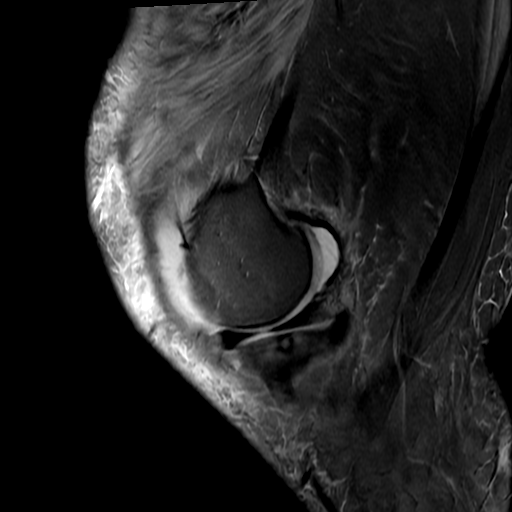
[im 20/33]
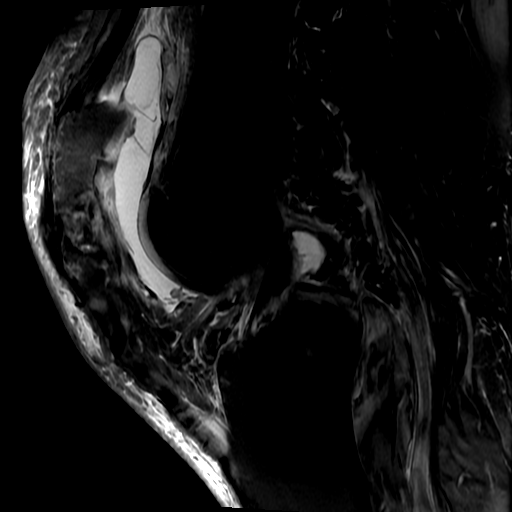
[im 33/33]
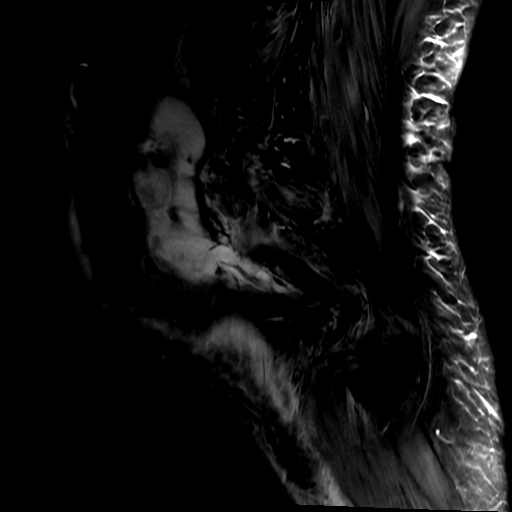

[Series 7: T2 fat-sat · oblique · 3.0mm · 0.33mm/px · 1 of 33 slices shown (2 of 2)]
[im 7/33]
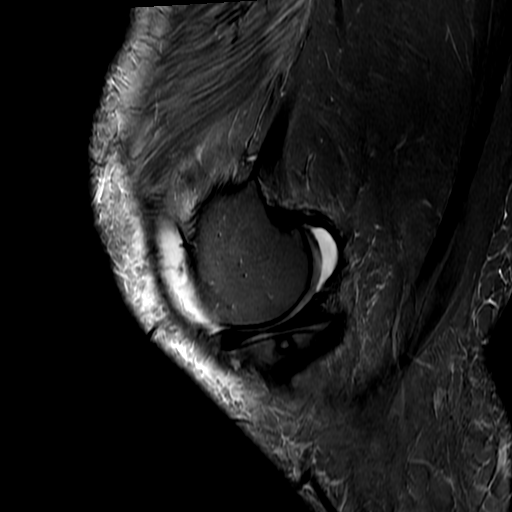

[10 of 40 positions shown; findings below may reference images not displayed]

FINDINGS: MENISCI

Medial meniscus:  Intact

Lateral meniscus: Moderate degenerative changes and oblique coursing
inferior articular surface tear involving the posterior horn mid
body junction region.

LIGAMENTS

Cruciates:  Intact

Collaterals:  Intact

CARTILAGE

Patellofemoral:  Moderate degenerative chondrosis.

Medial:  Moderate degenerative chondrosis.

Lateral:  Moderate degenerative chondrosis.

Joint: There is a moderate to large joint effusion with septations
and some internal debris. Mild surrounding inflammatory changes but
I do not see any obvious destructive cartilage changes or marrow
edema to suggest septic arthritis. Joint aspiration may be helpful
for further evaluation.

Popliteal Fossa:  No popliteal mass or Baker's cyst.

Extensor Mechanism: Severe chronic patellar tendinopathy and
probable prior surgical changes. Patella Alta is noted with an
elongated and diseased patellar tendon. The quadriceps tendon is
intact.

Bones: No acute bony findings. No findings suspicious for
osteomyelitis.

Other: Unremarkable knee musculature. No findings for myositis or
pyomyositis.
IMPRESSION: 1. Moderate to large joint effusion with septations and some
internal debris. Mild surrounding inflammatory changes but I do not
see any obvious destructive cartilage changes or marrow edema to
suggest septic arthritis. Joint aspiration may be helpful for
further evaluation.
2. Intact ligamentous structures and no acute bony findings.
3. Degenerative changes and oblique coursing inferior articular
surface tear involving the posterior horn mid body junction region
of the lateral meniscus.
4. Severe chronic patellar tendinopathy and probable prior surgical
changes.

## 2022-07-05 IMAGING — DX DG FOOT 2V*R*
2 series · 2 of 2 positions shown · non-contrast
Comparison: None.

CLINICAL DATA: Right foot pain.

EXAM:
RIGHT FOOT - 2 VIEW

[foot]
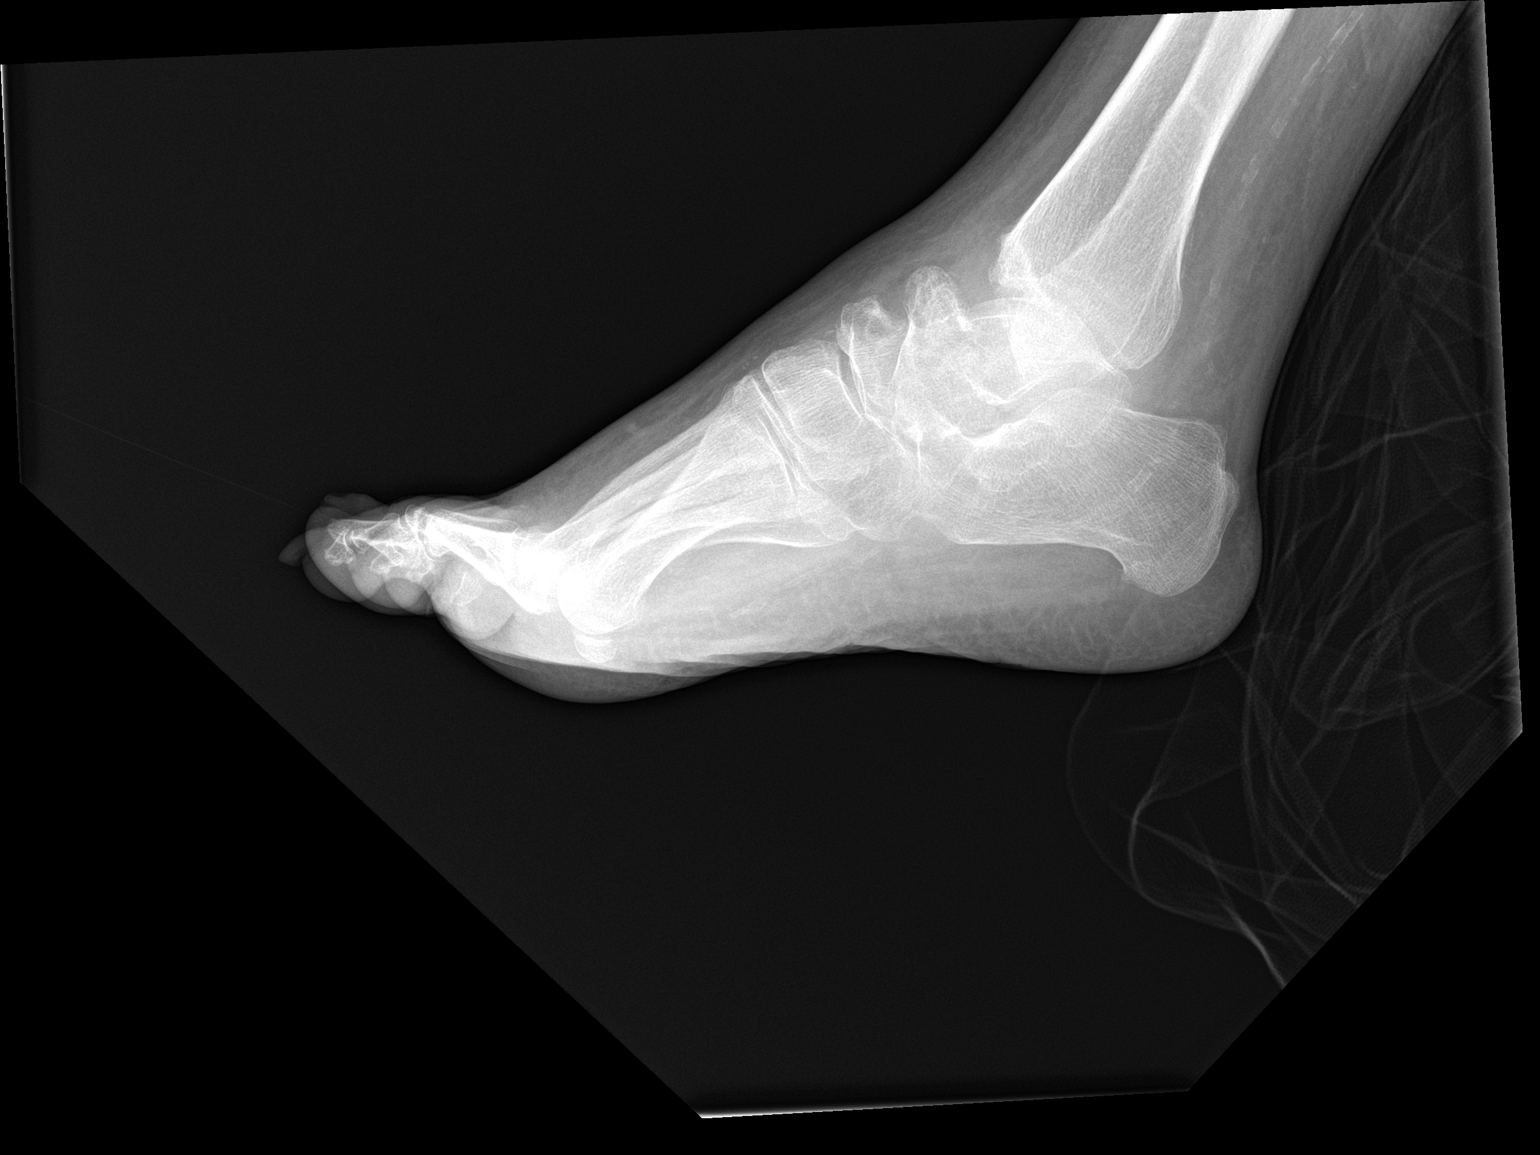

[leg]
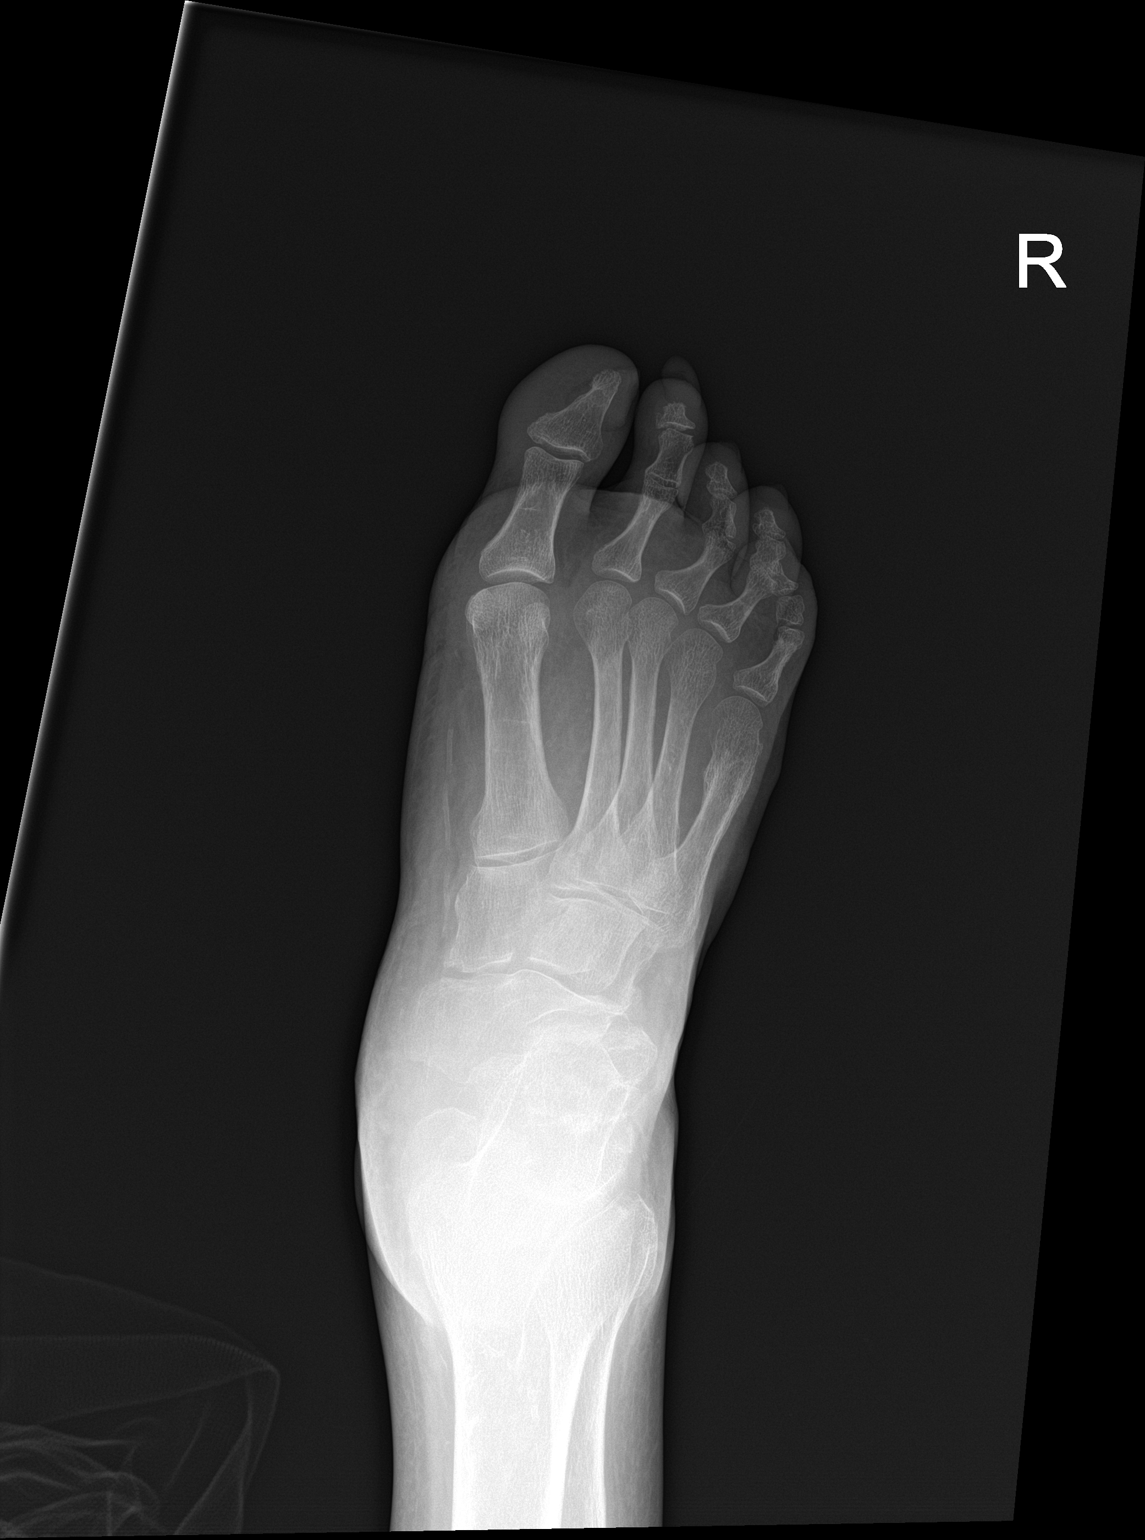

[2 of 2 positions shown; findings below may reference images not displayed]

FINDINGS: There is nonspecific soft tissue swelling about the foot and ankle.
There is an old healed fracture of the fifth metatarsal. No definite
acute displaced fracture or dislocation on this study. There is a
large osteophyte arising from the dorsal anterior talus. There is a
probable old injury involving the dorsal navicular. There are
extensive degenerative changes of the midfoot. There is an ankle
joint effusion. No radiographic evidence for osteomyelitis. Vascular
calcifications are noted.
IMPRESSION: 1. No acute displaced fracture or dislocation.
2. There is nonspecific soft tissue swelling about the foot.
3. Advanced degenerative changes of the midfoot suggestive of a
neuropathic joint.
